# Patient Record
Sex: Male | Born: 1940 | Race: Black or African American | Hispanic: No | Smoking: Former smoker
Health system: Southern US, Community
[De-identification: ages and names within clinical notes are randomized; demographics above are authoritative.]

## PROBLEM LIST (undated history)

## (undated) DIAGNOSIS — E782 Mixed hyperlipidemia: Secondary | ICD-10-CM

## (undated) DIAGNOSIS — K635 Polyp of colon: Secondary | ICD-10-CM

## (undated) DIAGNOSIS — I10 Essential (primary) hypertension: Secondary | ICD-10-CM

## (undated) DIAGNOSIS — I350 Nonrheumatic aortic (valve) stenosis: Secondary | ICD-10-CM

## (undated) DIAGNOSIS — I4510 Unspecified right bundle-branch block: Secondary | ICD-10-CM

## (undated) DIAGNOSIS — M1990 Unspecified osteoarthritis, unspecified site: Secondary | ICD-10-CM

## (undated) DIAGNOSIS — K219 Gastro-esophageal reflux disease without esophagitis: Secondary | ICD-10-CM

## (undated) DIAGNOSIS — H548 Legal blindness, as defined in USA: Secondary | ICD-10-CM

## (undated) DIAGNOSIS — E119 Type 2 diabetes mellitus without complications: Secondary | ICD-10-CM

## (undated) DIAGNOSIS — H409 Unspecified glaucoma: Secondary | ICD-10-CM

## (undated) DIAGNOSIS — I739 Peripheral vascular disease, unspecified: Secondary | ICD-10-CM

## (undated) DIAGNOSIS — C801 Malignant (primary) neoplasm, unspecified: Secondary | ICD-10-CM

## (undated) DIAGNOSIS — D649 Anemia, unspecified: Secondary | ICD-10-CM

## (undated) DIAGNOSIS — N186 End stage renal disease: Secondary | ICD-10-CM

## (undated) DIAGNOSIS — N189 Chronic kidney disease, unspecified: Secondary | ICD-10-CM

## (undated) HISTORY — PX: KNEE ARTHROSCOPY: SUR90

## (undated) HISTORY — PX: COLONOSCOPY: SHX174

## (undated) HISTORY — DX: Chronic kidney disease, unspecified: N18.9

## (undated) HISTORY — PX: PROSTATE SURGERY: SHX751

---

## 1998-09-11 ENCOUNTER — Emergency Department (HOSPITAL_COMMUNITY): Admission: EM | Admit: 1998-09-11 | Discharge: 1998-09-11 | Payer: Self-pay | Admitting: Emergency Medicine

## 2001-08-06 ENCOUNTER — Encounter (INDEPENDENT_AMBULATORY_CARE_PROVIDER_SITE_OTHER): Payer: Self-pay | Admitting: Specialist

## 2001-08-06 ENCOUNTER — Ambulatory Visit (HOSPITAL_COMMUNITY): Admission: RE | Admit: 2001-08-06 | Discharge: 2001-08-06 | Payer: Self-pay | Admitting: Gastroenterology

## 2001-11-17 ENCOUNTER — Encounter: Payer: Self-pay | Admitting: Emergency Medicine

## 2001-11-17 ENCOUNTER — Emergency Department (HOSPITAL_COMMUNITY): Admission: EM | Admit: 2001-11-17 | Discharge: 2001-11-17 | Payer: Self-pay | Admitting: Emergency Medicine

## 2001-11-23 ENCOUNTER — Emergency Department (HOSPITAL_COMMUNITY): Admission: EM | Admit: 2001-11-23 | Discharge: 2001-11-24 | Payer: Self-pay | Admitting: *Deleted

## 2001-11-24 ENCOUNTER — Encounter: Payer: Self-pay | Admitting: Emergency Medicine

## 2001-12-04 ENCOUNTER — Encounter: Admission: RE | Admit: 2001-12-04 | Discharge: 2001-12-04 | Payer: Self-pay | Admitting: Gastroenterology

## 2001-12-04 ENCOUNTER — Encounter: Payer: Self-pay | Admitting: Gastroenterology

## 2002-06-02 ENCOUNTER — Ambulatory Visit (HOSPITAL_COMMUNITY): Admission: RE | Admit: 2002-06-02 | Discharge: 2002-06-02 | Payer: Self-pay | Admitting: Orthopedic Surgery

## 2002-06-03 ENCOUNTER — Ambulatory Visit (HOSPITAL_COMMUNITY): Admission: RE | Admit: 2002-06-03 | Discharge: 2002-06-03 | Payer: Self-pay | Admitting: Orthopedic Surgery

## 2003-11-19 ENCOUNTER — Emergency Department (HOSPITAL_COMMUNITY): Admission: EM | Admit: 2003-11-19 | Discharge: 2003-11-19 | Payer: Self-pay | Admitting: Emergency Medicine

## 2004-01-04 ENCOUNTER — Ambulatory Visit: Payer: Self-pay | Admitting: Internal Medicine

## 2004-04-11 ENCOUNTER — Ambulatory Visit: Payer: Self-pay | Admitting: Internal Medicine

## 2004-07-11 ENCOUNTER — Ambulatory Visit: Payer: Self-pay | Admitting: Internal Medicine

## 2004-09-12 ENCOUNTER — Ambulatory Visit: Payer: Self-pay | Admitting: Internal Medicine

## 2004-12-26 ENCOUNTER — Ambulatory Visit: Payer: Self-pay | Admitting: Internal Medicine

## 2005-02-14 ENCOUNTER — Emergency Department (HOSPITAL_COMMUNITY): Admission: EM | Admit: 2005-02-14 | Discharge: 2005-02-14 | Payer: Self-pay | Admitting: Emergency Medicine

## 2005-03-13 ENCOUNTER — Ambulatory Visit: Admission: RE | Admit: 2005-03-13 | Discharge: 2005-06-22 | Payer: Self-pay | Admitting: Radiation Oncology

## 2005-03-27 ENCOUNTER — Ambulatory Visit: Payer: Self-pay | Admitting: Internal Medicine

## 2005-05-04 ENCOUNTER — Encounter: Admission: RE | Admit: 2005-05-04 | Discharge: 2005-05-04 | Payer: Self-pay | Admitting: Urology

## 2005-05-18 ENCOUNTER — Ambulatory Visit (HOSPITAL_BASED_OUTPATIENT_CLINIC_OR_DEPARTMENT_OTHER): Admission: RE | Admit: 2005-05-18 | Discharge: 2005-05-18 | Payer: Self-pay | Admitting: Urology

## 2006-03-18 ENCOUNTER — Emergency Department (HOSPITAL_COMMUNITY): Admission: EM | Admit: 2006-03-18 | Discharge: 2006-03-18 | Payer: Self-pay | Admitting: Emergency Medicine

## 2006-10-22 DIAGNOSIS — N400 Enlarged prostate without lower urinary tract symptoms: Secondary | ICD-10-CM | POA: Insufficient documentation

## 2006-10-22 DIAGNOSIS — Z860100 Personal history of colon polyps, unspecified: Secondary | ICD-10-CM | POA: Insufficient documentation

## 2006-10-22 DIAGNOSIS — E1129 Type 2 diabetes mellitus with other diabetic kidney complication: Secondary | ICD-10-CM | POA: Insufficient documentation

## 2007-06-05 ENCOUNTER — Emergency Department (HOSPITAL_COMMUNITY): Admission: EM | Admit: 2007-06-05 | Discharge: 2007-06-05 | Payer: Self-pay | Admitting: Emergency Medicine

## 2008-02-28 ENCOUNTER — Emergency Department (HOSPITAL_COMMUNITY): Admission: EM | Admit: 2008-02-28 | Discharge: 2008-02-29 | Payer: Self-pay | Admitting: Emergency Medicine

## 2008-03-03 ENCOUNTER — Emergency Department (HOSPITAL_COMMUNITY): Admission: EM | Admit: 2008-03-03 | Discharge: 2008-03-03 | Payer: Self-pay | Admitting: *Deleted

## 2010-07-21 NOTE — Op Note (Signed)
NAME:  Elijah White, Elijah White               ACCOUNT NO.:  [id number]   MEDICAL RECORD NO.:  [id number]          PATIENT TYPE:  AMB   LOCATION:  [facility]                         FACILITY:  [facility]   PHYSICIAN:  Mark C. Karsten Ro, M.D.  DATE OF BIRTH:  10/24/1940   DATE OF PROCEDURE:  05/18/2005  DATE OF DISCHARGE:  05/18/2005                                 OPERATIVE REPORT   PREOPERATIVE DIAGNOSIS:  Adenocarcinoma of the prostate.   POSTOPERATIVE DIAGNOSIS:  Adenocarcinoma of the prostate.   PROCEDURE:  I-125 seed implant.   SURGEON:  Mark C. Karsten Ro, M.D.   RADIATION ONCOLOGIST:  Sheral Apley. Tammi Klippel, M.D.   ANESTHESIA:  General.   DRAINS:  16-French Foley catheter.   BLOOD LOSS:  Minimal.   COMPLICATIONS:  None.   INDICATIONS:  The patient is a 70 year old black male with biopsy-proven  adenocarcinoma of the prostate. He has elected to proceed with radioactive  seed implant. 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 He  is brought to the OR today for radioactive seed implant. He has undergone  external radiotherapy for 5 weeks and now presents for radioactive seed  implant. The risks, complications and alternatives have been discussed, he  understands and elected to proceed.   DESCRIPTION OF OPERATION:  After informed consent, the patient was brought  to the major OR, placed on the table, administered general LMA anesthesia.  Genitalia and perineum were sterilely prepped and draped. A rectal tube was  placed in the rectum, a 18 French Foley catheter was placed in the bladder  with dilute contrast filling the balloon. The transrectal ultrasound probe  was then placed in the rectum and real-time ultrasound planning was  performed. This was done by Dr. Tammi Klippel and once this was completed I  proceeded according to the calculated plan using real-time fluoroscopy to  place the seeds which was  performed without difficulty. After all seeds were  placed, the transrectal ultrasound, rectal tube and Foley catheter were  removed.   A 17-French flexible scope was then used and cystoscopy was performed. The  urethra was noted to be normal, sphincter intact. The prostatic urethra  revealed no lesions. I noted a single seed within the bladder. It appeared  over on the left-hand side and was grasped with forceps and extracted. The  remainder of the bladder was inspected and noted to be free of any tumor,  stones or inflammatory lesions.   A new 16-French Foley catheter was placed in the bladder and connected to  close system drainage. The patient was awakened and taken to the recovery  room in stable satisfactory condition. He tolerated the procedure well with  no intraoperative complications.   He will be maintained on Cipro 500 mg b.i.d. for a week, given a  prescription for Vicodin ES #30 and will take Flomax 1 q.h.s. nightly as  well. He will follow-up with myself and Dr. Tammi Klippel in 3 weeks.      Mark C. Karsten Ro, M.D.  Electronically Signed  MCO/MEDQ  D:  05/18/2005  T:  05/21/2005  Job:  [id number]   cc:   Sheral Apley Tammi Klippel, M.D.  Fax: [phone]

## 2010-07-21 NOTE — Procedures (Signed)
[facility]  Patient:    Elijah White, Elijah White Visit Number: [id number] MRN: [id number]          Service Type: END Location: ENDO Attending Physician:  Rafael Bihari Dictated by:   Elyse Jarvis Amedeo Plenty, M.D. Proc. Date: 08/06/01 Admit Date:  08/06/2001 Discharge Date: 08/06/2001   CC:         Marletta Lor, M.D.   Procedure Report  PROCEDURE:  Colonoscopy with polypectomy.  INDICATION FOR PROCEDURE:  History of adenomatous colon polyps with last colonoscopy 10 years ago.  DESCRIPTION OF PROCEDURE:  The patient was placed in the left lateral decubitus position and placed on the pulse monitor with continuous low flow oxygen delivered by nasal cannula. He was sedated with 50 mg IV Demerol and 6 mg IV Versed. The Olympus video colonoscope was inserted into the rectum and advanced to the cecum, confirmed by transillumination at McBurneys point and visualization of the ileocecal valve and appendiceal orifice. The prep was excellent. Within the base of the cecum near the appendiceal orifice was a 1 cm sessile polyp which was removed by snare. The remainder of the cecum, ascending, transverse, descending and sigmoid colon all appeared normal with no masses, polyps, diverticula or other mucosal abnormalities. The scope was withdrawn and the patient returned to the recovery room in stable condition. The patient tolerated the procedure well and there were no immediate complications.  IMPRESSION:  Cecal polyp otherwise normal colonoscopy.  PLAN:  Await histology and will probably recommend repeat colonoscopy in five years. Dictated by:   Elyse Jarvis Amedeo Plenty, M.D. Attending Physician:  Rafael Bihari DD:  08/06/01 TD:  08/08/01 Job: [id number]

## 2010-07-21 NOTE — Op Note (Signed)
NAME:  Elijah White, Elijah White                         ACCOUNT NO.:  [id number]   MEDICAL RECORD NO.:  [id number]                   PATIENT TYPE:  AMB   LOCATION:  DAY                                  [facility]ACILITY:  [facility]   PHYSICIAN:  Kipp Brood. Gladstone Lighter, M.D.             DATE OF BIRTH:  05/20/1940   DATE OF PROCEDURE:  06/03/2002  DATE OF DISCHARGE:                                 OPERATIVE REPORT   SURGEON:  Kipp Brood. Gladstone Lighter, M.D.   ASSISTANT:  Nurse.   PREOPERATIVE DIAGNOSES:  1. Degenerative arthritis of the left knee.  2. Torn medial meniscus of the left knee.   POSTOPERATIVE DIAGNOSES:  1. Degenerative arthritis of the left knee.  2. Torn medial meniscus of the left knee.   OPERATION:  1. Diagnostic arthroscopy of left knee.  2. Medial meniscectomy of the posterior horn, left knee.  3. Synovectomy, left knee with suprapatellar pouch.   DESCRIPTION OF PROCEDURE:  Under spinal anesthesia, a routine orthopedic  prep and draping of the left lower extremity was carried out.  IV Ancef 1 g  preop.  At this time, a small incision was made in the suprapatellar pouch.  Inflow canula was inserted.  The knee was distended with saline.  Another  small punctate incision made anterolateral joint.  Arthroscope was entered,  and a complete diagnostic arthroscopy was carried out.  Following another  small incision was made medially.  I entered the shaver suction device, did  a partial medial meniscectomy to the posterior horn.  Photographs were  taken.  At this time, I went up in the suprapatellar pouch and did a  synovectomy.  He had some arthritic changes of the patella.  The lateral  joint was fine.  Cruciates were intact.  Going back down the medial joint  space and noted that he had rather significant arthritic changes in his  medial femoral condyle as well as his medial tibial plateau.  His main  problem was medial joint arthritis and a tear of the posterior and the  medial meniscus.   Thoroughly irrigated and cleaned the area out, closed all  three punctate incisions with 3-0 nylon suture.  I injected 30 mL of 0.5%  Marcaine with epinephrine into the knee joint.  Sterile Neosporin bundle  dressing was applied, and he left the operating room in satisfactory  condition.   FOLLOW-UP CARE:  1. He will be on crutches.  2. Partial weightbearing.  3. He will be on Percocet 10/650 for pain.  4. He will be on Bufferin 1 b.i.d. as an anticoagulant x 2 weeks.  5. I will see him in the office in 12-14 days for a follow-up.  Ronald A. Gladstone Lighter, M.D.   RAG/MEDQ  D:  06/03/2002  T:  06/03/2002  Job:  [id number]

## 2010-11-28 LAB — URINALYSIS, ROUTINE W REFLEX MICROSCOPIC
Bilirubin Urine: NEGATIVE
Ketones, ur: NEGATIVE
Nitrite: NEGATIVE
Urobilinogen, UA: 0.2

## 2010-11-28 LAB — URINE CULTURE: Culture: NO GROWTH

## 2010-12-07 LAB — COMPREHENSIVE METABOLIC PANEL
ALT: 16 U/L (ref 0–53)
BUN: 22 mg/dL (ref 6–23)
CO2: 23 mEq/L (ref 19–32)
CO2: 27 mEq/L (ref 19–32)
Calcium: 9.1 mg/dL (ref 8.4–10.5)
Calcium: 9.2 mg/dL (ref 8.4–10.5)
Chloride: 108 mEq/L (ref 96–112)
Creatinine, Ser: 1.63 mg/dL — ABNORMAL HIGH (ref 0.4–1.5)
Creatinine, Ser: 1.88 mg/dL — ABNORMAL HIGH (ref 0.4–1.5)
GFR calc non Af Amer: 36 mL/min — ABNORMAL LOW (ref >60)
GFR calc non Af Amer: 42 mL/min — ABNORMAL LOW (ref >60)
Glucose, Bld: 112 mg/dL — ABNORMAL HIGH (ref 70–99)
Glucose, Bld: 169 mg/dL — ABNORMAL HIGH (ref 70–99)
Sodium: 140 mEq/L (ref 135–145)
Total Bilirubin: 1.2 mg/dL (ref 0.3–1.2)

## 2010-12-07 LAB — DIFFERENTIAL
Basophils Absolute: 0 10*3/uL (ref 0.0–0.1)
Basophils Absolute: 0 10*3/uL (ref 0.0–0.1)
Eosinophils Absolute: 0.1 10*3/uL (ref 0.0–0.7)
Lymphocytes Relative: 25 % (ref 12–46)
Lymphs Abs: 1.4 10*3/uL (ref 0.7–4.0)
Lymphs Abs: 1.5 10*3/uL (ref 0.7–4.0)
Neutrophils Relative %: 58 % (ref 43–77)
Neutrophils Relative %: 62 % (ref 43–77)

## 2010-12-07 LAB — CBC
HCT: 41.6 % (ref 39.0–52.0)
Hemoglobin: 13.3 g/dL (ref 13.0–17.0)
Hemoglobin: 13.9 g/dL (ref 13.0–17.0)
MCHC: 32.6 g/dL (ref 30.0–36.0)
MCHC: 33.5 g/dL (ref 30.0–36.0)
MCV: 95.3 fL (ref 78.0–100.0)
MCV: 97.1 fL (ref 78.0–100.0)
RBC: 4.22 MIL/uL (ref 4.22–5.81)
RBC: 4.37 MIL/uL (ref 4.22–5.81)

## 2010-12-07 LAB — URINALYSIS, ROUTINE W REFLEX MICROSCOPIC
Ketones, ur: NEGATIVE mg/dL
Ketones, ur: NEGATIVE mg/dL
Nitrite: NEGATIVE
Nitrite: NEGATIVE
Protein, ur: NEGATIVE mg/dL
Specific Gravity, Urine: 1.012 (ref 1.005–1.030)
Urobilinogen, UA: 0.2 mg/dL (ref 0.0–1.0)
Urobilinogen, UA: 0.2 mg/dL (ref 0.0–1.0)
pH: 5.5 (ref 5.0–8.0)
pH: 6 (ref 5.0–8.0)

## 2010-12-07 LAB — LIPASE, BLOOD
Lipase: 23 U/L (ref 11–59)
Lipase: 28 U/L (ref 11–59)

## 2010-12-07 LAB — RAPID URINE DRUG SCREEN, HOSP PERFORMED
Cocaine: NOT DETECTED
Tetrahydrocannabinol: NOT DETECTED

## 2010-12-07 LAB — POCT CARDIAC MARKERS
CKMB, poc: 2.8 ng/mL (ref 1.0–8.0)
CKMB, poc: 3.5 ng/mL (ref 1.0–8.0)
Troponin i, poc: <0.05 ng/mL (ref 0.00–0.09)

## 2011-06-18 LAB — HM DIABETES EYE EXAM

## 2012-08-22 ENCOUNTER — Encounter (HOSPITAL_COMMUNITY): Payer: Self-pay | Admitting: Emergency Medicine

## 2012-08-22 ENCOUNTER — Emergency Department (HOSPITAL_COMMUNITY)
Admission: EM | Admit: 2012-08-22 | Discharge: 2012-08-22 | Disposition: A | Discharge status: Home / Self Care | Payer: Medicare Other | Attending: Emergency Medicine | Admitting: Emergency Medicine

## 2012-08-22 ENCOUNTER — Emergency Department (HOSPITAL_COMMUNITY): Payer: Medicare Other

## 2012-08-22 DIAGNOSIS — Z862 Personal history of diseases of the blood and blood-forming organs and certain disorders involving the immune mechanism: Secondary | ICD-10-CM | POA: Insufficient documentation

## 2012-08-22 DIAGNOSIS — Z8639 Personal history of other endocrine, nutritional and metabolic disease: Secondary | ICD-10-CM | POA: Insufficient documentation

## 2012-08-22 DIAGNOSIS — R609 Edema, unspecified: Secondary | ICD-10-CM | POA: Insufficient documentation

## 2012-08-22 DIAGNOSIS — F17200 Nicotine dependence, unspecified, uncomplicated: Secondary | ICD-10-CM | POA: Insufficient documentation

## 2012-08-22 DIAGNOSIS — E119 Type 2 diabetes mellitus without complications: Secondary | ICD-10-CM | POA: Insufficient documentation

## 2012-08-22 DIAGNOSIS — I10 Essential (primary) hypertension: Secondary | ICD-10-CM | POA: Insufficient documentation

## 2012-08-22 DIAGNOSIS — I169 Hypertensive crisis, unspecified: Secondary | ICD-10-CM

## 2012-08-22 HISTORY — DX: Mixed hyperlipidemia: E78.2

## 2012-08-22 HISTORY — DX: Essential (primary) hypertension: I10

## 2012-08-22 HISTORY — DX: Type 2 diabetes mellitus without complications: E11.9

## 2012-08-22 LAB — URINALYSIS, ROUTINE W REFLEX MICROSCOPIC
Bilirubin Urine: NEGATIVE
Ketones, ur: NEGATIVE mg/dL
Leukocytes, UA: NEGATIVE
Nitrite: NEGATIVE
Protein, ur: >300 mg/dL — AB
Urobilinogen, UA: 1 mg/dL (ref 0.0–1.0)
pH: 6.5 (ref 5.0–8.0)

## 2012-08-22 LAB — BASIC METABOLIC PANEL
BUN: 15 mg/dL (ref 6–23)
CO2: 26 mEq/L (ref 19–32)
Chloride: 103 mEq/L (ref 96–112)
GFR calc Af Amer: 50 mL/min — ABNORMAL LOW (ref >90)
Glucose, Bld: 241 mg/dL — ABNORMAL HIGH (ref 70–99)
Potassium: 3.7 mEq/L (ref 3.5–5.1)

## 2012-08-22 LAB — CBC WITH DIFFERENTIAL/PLATELET
Hemoglobin: 15.8 g/dL (ref 13.0–17.0)
Lymphocytes Relative: 28 % (ref 12–46)
Lymphs Abs: 1.5 10*3/uL (ref 0.7–4.0)
Monocytes Relative: 10 % (ref 3–12)
Neutro Abs: 3.2 10*3/uL (ref 1.7–7.7)
Neutrophils Relative %: 60 % (ref 43–77)
RBC: 4.88 MIL/uL (ref 4.22–5.81)

## 2012-08-22 LAB — URINE MICROSCOPIC-ADD ON

## 2012-08-22 MED ORDER — ESOMEPRAZOLE MAGNESIUM 40 MG PO CPDR: 40.0000 mg | DELAYED_RELEASE_CAPSULE | Freq: Every day | ORAL | Status: DC

## 2012-08-22 MED ORDER — OLMESARTAN MEDOXOMIL-HCTZ 20-12.5 MG PO TABS: 1.0000 | ORAL_TABLET | Freq: Every day | ORAL | Status: DC

## 2012-08-22 MED ORDER — ATORVASTATIN CALCIUM 10 MG PO TABS: 20.0000 mg | ORAL_TABLET | Freq: Every day | ORAL | Status: DC

## 2012-08-22 MED ORDER — METFORMIN HCL 1000 MG PO TABS: 1000.0000 mg | ORAL_TABLET | Freq: Two times a day (BID) | ORAL | Status: DC

## 2012-08-22 NOTE — ED Provider Notes (Signed)
History     CSN: [id number]  Arrival date & time 08/22/12  1034   First MD Initiated Contact with Patient 08/22/12 1105      Chief Complaint  Patient presents with  . Hypertension    (Consider location/radiation/quality/duration/timing/severity/associated sxs/prior treatment) HPI Patient presents with concern of hypertension.  He has no physical complaints, states that he has been in his usual state of health, with no recent changes.  A home health visit today found the patient to be hypertensive, with blood pressure 208/102. Ureterectasis has no ongoing physical complaints. He does have a history of hypertension, and he acknowledges that he has not taken any medication in months.  Past Medical History  Diagnosis Date  . Diabetes mellitus without complication   . Hypertension   . Elevated cholesterol with high triglycerides     No past surgical history on file.  No family history on file.  History  Substance Use Topics  . Smoking status: Current Every Day Smoker  . Smokeless tobacco: Not on file  . Alcohol Use: No      Review of Systems  Constitutional:       Per HPI, otherwise negative  HENT:       Per HPI, otherwise negative  Respiratory:       Per HPI, otherwise negative  Cardiovascular:       Per HPI, otherwise negative  Gastrointestinal: Negative for vomiting.  Endocrine:       Negative aside from HPI  Genitourinary:       Neg aside from HPI   Musculoskeletal:       Mild symmetric bilateral lower extremity edema  Skin: Negative.   Neurological: Negative for syncope.    Allergies  Review of patient's allergies indicates no known allergies.  Home Medications  No current outpatient prescriptions on file.  BP 177/89  Pulse 66  Temp(Src) 98 F (36.7 C) (Oral)  Resp 20  SpO2 100%  Physical Exam  Nursing note and vitals reviewed. Constitutional: He is oriented to person, place, and time. He appears well-developed. No distress.  HENT:  Head:  Normocephalic and atraumatic.  Eyes: Conjunctivae and EOM are normal.  Cardiovascular: Normal rate and regular rhythm.   Pulmonary/Chest: Effort normal. No stridor. No respiratory distress.  Abdominal: He exhibits no distension.  Musculoskeletal: He exhibits edema.  Symmetric one plus edema bilaterally, no tenderness to palpation  Neurological: He is alert and oriented to person, place, and time. No cranial nerve deficit. He exhibits normal muscle tone. Coordination normal.  Skin: Skin is warm and dry.  Psychiatric: He has a normal mood and affect.    ED Course  Procedures (including critical care time)  Labs Reviewed  URINALYSIS, ROUTINE W REFLEX MICROSCOPIC  CBC WITH DIFFERENTIAL  BASIC METABOLIC PANEL   Dg Chest 2 View  08/22/2012   *RADIOLOGY REPORT*  Clinical Data: Chest discomfort  CHEST - 2 VIEW  Comparison: 02/29/2008  Findings:  Grossly unchanged cardiac silhouette and mediastinal contours. Evaluation of the retrosternal clear space is obscured secondary to overlying soft tissues.  Unchanged minimal right infrahilar heterogeneous opacities favored to represent atelectasis.  No new focal airspace opacity.  No pleural effusion or pneumothorax.  No definite evidence of edema.  Unchanged bones.  IMPRESSION: No acute cardiopulmonary disease.   Original Report Authenticated By: Jake Seats, MD     No diagnosis found.  Pulse ox 99% room air normal  Update: Throughout the patient's emergency department stay he remained in no distress, with  no new complaints.  He was updated all lab results.  Discharged in stable condition.   Date: 08/22/2012  Rate: 47  Rhythm: sinus bradycardia  QRS Axis: normal  Intervals: normal  ST/T Wave abnormalities: normal  Conduction Disutrbances:none  Narrative Interpretation:   Old EKG Reviewed: none available BORDERLINE  Cardiac: 61- sr, normal     MDM  This patient presents with essentially asymptomatic hypertension.  On exam he is  awake and alert, no neurologic complaints.  Patient labs were reassuring, though there is demonstration of decreased renal function.  Absent any significant complaints, the patient was discharged in stable condition with reinitiation of his antihypertensives to f/u w PMD.        Carmin Muskrat, MD 08/22/12 1431

## 2012-08-22 NOTE — ED Notes (Signed)
Patient was by a nurse from [employer] and found his BP to be 202/118. Patient states the she told him to come to the ED.  Patient states that he has BP medications but he is not taking them.

## 2012-08-22 NOTE — ED Notes (Signed)
Wife stated, the nurse did a home visit for [facility] and his BP was up. Told top come to ER

## 2012-10-29 DIAGNOSIS — IMO0002 Reserved for concepts with insufficient information to code with codable children: Secondary | ICD-10-CM | POA: Insufficient documentation

## 2012-10-29 DIAGNOSIS — H401190 Primary open-angle glaucoma, unspecified eye, stage unspecified: Secondary | ICD-10-CM | POA: Insufficient documentation

## 2013-09-07 ENCOUNTER — Other Ambulatory Visit: Payer: Self-pay | Admitting: Internal Medicine

## 2013-09-07 DIAGNOSIS — Z136 Encounter for screening for cardiovascular disorders: Secondary | ICD-10-CM

## 2014-12-10 DIAGNOSIS — N139 Obstructive and reflux uropathy, unspecified: Secondary | ICD-10-CM | POA: Diagnosis not present

## 2014-12-10 DIAGNOSIS — R32 Unspecified urinary incontinence: Secondary | ICD-10-CM | POA: Diagnosis not present

## 2014-12-10 DIAGNOSIS — N183 Chronic kidney disease, stage 3 (moderate): Secondary | ICD-10-CM | POA: Diagnosis not present

## 2014-12-10 DIAGNOSIS — I10 Essential (primary) hypertension: Secondary | ICD-10-CM | POA: Diagnosis not present

## 2014-12-22 DIAGNOSIS — E1122 Type 2 diabetes mellitus with diabetic chronic kidney disease: Secondary | ICD-10-CM | POA: Diagnosis not present

## 2014-12-22 DIAGNOSIS — N183 Chronic kidney disease, stage 3 (moderate): Secondary | ICD-10-CM | POA: Diagnosis not present

## 2014-12-22 DIAGNOSIS — N08 Glomerular disorders in diseases classified elsewhere: Secondary | ICD-10-CM | POA: Diagnosis not present

## 2014-12-22 DIAGNOSIS — I129 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 kidney disease, or unspecified chronic kidney disease: Secondary | ICD-10-CM | POA: Diagnosis not present

## 2014-12-22 DIAGNOSIS — R609 Edema, unspecified: Secondary | ICD-10-CM | POA: Diagnosis not present

## 2015-02-07 DIAGNOSIS — H401133 Primary open-angle glaucoma, bilateral, severe stage: Secondary | ICD-10-CM | POA: Diagnosis not present

## 2015-04-06 DIAGNOSIS — I129 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 kidney disease, or unspecified chronic kidney disease: Secondary | ICD-10-CM | POA: Diagnosis not present

## 2015-04-06 DIAGNOSIS — D179 Benign lipomatous neoplasm, unspecified: Secondary | ICD-10-CM | POA: Diagnosis not present

## 2015-04-06 DIAGNOSIS — Z79899 Other long term (current) drug therapy: Secondary | ICD-10-CM | POA: Diagnosis not present

## 2015-04-06 DIAGNOSIS — E1122 Type 2 diabetes mellitus with diabetic chronic kidney disease: Secondary | ICD-10-CM | POA: Diagnosis not present

## 2015-04-06 DIAGNOSIS — N183 Chronic kidney disease, stage 3 (moderate): Secondary | ICD-10-CM | POA: Diagnosis not present

## 2015-04-20 DIAGNOSIS — K1321 Leukoplakia of oral mucosa, including tongue: Secondary | ICD-10-CM | POA: Diagnosis not present

## 2015-04-22 DIAGNOSIS — H401133 Primary open-angle glaucoma, bilateral, severe stage: Secondary | ICD-10-CM | POA: Diagnosis not present

## 2015-05-27 ENCOUNTER — Ambulatory Visit (INDEPENDENT_AMBULATORY_CARE_PROVIDER_SITE_OTHER): Payer: Medicare Other | Admitting: Podiatry

## 2015-05-27 ENCOUNTER — Encounter: Payer: Self-pay | Admitting: Podiatry

## 2015-05-27 VITALS — BP 163/82 | HR 59 | Resp 14

## 2015-05-27 DIAGNOSIS — E119 Type 2 diabetes mellitus without complications: Secondary | ICD-10-CM

## 2015-05-27 DIAGNOSIS — M79609 Pain in unspecified limb: Principal | ICD-10-CM

## 2015-05-27 DIAGNOSIS — B351 Tinea unguium: Secondary | ICD-10-CM | POA: Diagnosis not present

## 2015-05-27 NOTE — Progress Notes (Signed)
   Subjective:    Patient ID: Elijah White, male    DOB: Jul 11, 1940, 75 y.o.   MRN: [id number]  HPI this patient presents to the office for an evaluation of his diabetic feet as well as treatment of his long nails. He says his nails have grown long and thick and have become ingrown his nails are painful as he walks and wears his shoes. Patient denies any injury drainage or pain from his nails. He presents the office today for preventative foot care services and evaluation of his diabetic feet    Review of Systems  All other systems reviewed and are negative.      Objective:   Physical Exam GENERAL APPEARANCE: Alert, conversant. Appropriately groomed. No acute distress.  VASCULAR: Pedal pulses are  palpable at  DP  Bilateral. PT pulses are non palpable both feet.  Capillary refill time is immediate to all digits,  Normal temperature gradient.   NEUROLOGIC: sensation is normal to 5.07 monofilament at 5/5 sites bilateral.  Light touch is intact bilateral, Muscle strength normal.  MUSCULOSKELETAL: acceptable muscle strength, tone and stability bilateral.  Intrinsic muscluature intact bilateral.  Rectus appearance of foot and digits noted bilateral. Mild swelling noted.  DERMATOLOGIC: skin color, texture, and turgor are within normal limits.  No preulcerative lesions or ulcers  are seen, no interdigital maceration noted.  No open lesions present.  . No drainage noted.  NAILS  Thick disfigured discolored nails both feet.  No nail infections noted.        Assessment & Plan:  Onychomycosis B/L   Diabetes with angiopathy   IE  Debride nails bilateral  RTC 3 months  Gardiner Barefoot DPM

## 2015-06-13 DIAGNOSIS — H401133 Primary open-angle glaucoma, bilateral, severe stage: Secondary | ICD-10-CM | POA: Diagnosis not present

## 2015-07-08 DIAGNOSIS — N183 Chronic kidney disease, stage 3 (moderate): Secondary | ICD-10-CM | POA: Diagnosis not present

## 2015-07-08 DIAGNOSIS — I129 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 kidney disease, or unspecified chronic kidney disease: Secondary | ICD-10-CM | POA: Diagnosis not present

## 2015-07-08 DIAGNOSIS — E1122 Type 2 diabetes mellitus with diabetic chronic kidney disease: Secondary | ICD-10-CM | POA: Diagnosis not present

## 2015-07-21 DIAGNOSIS — R600 Localized edema: Secondary | ICD-10-CM | POA: Diagnosis not present

## 2015-07-21 DIAGNOSIS — N183 Chronic kidney disease, stage 3 (moderate): Secondary | ICD-10-CM | POA: Diagnosis not present

## 2015-07-21 DIAGNOSIS — I10 Essential (primary) hypertension: Secondary | ICD-10-CM | POA: Diagnosis not present

## 2015-07-21 DIAGNOSIS — N139 Obstructive and reflux uropathy, unspecified: Secondary | ICD-10-CM | POA: Diagnosis not present

## 2015-10-14 DIAGNOSIS — N183 Chronic kidney disease, stage 3 (moderate): Secondary | ICD-10-CM | POA: Diagnosis not present

## 2015-10-14 DIAGNOSIS — Z79899 Other long term (current) drug therapy: Secondary | ICD-10-CM | POA: Diagnosis not present

## 2015-10-14 DIAGNOSIS — N08 Glomerular disorders in diseases classified elsewhere: Secondary | ICD-10-CM | POA: Diagnosis not present

## 2015-10-14 DIAGNOSIS — E1122 Type 2 diabetes mellitus with diabetic chronic kidney disease: Secondary | ICD-10-CM | POA: Diagnosis not present

## 2015-10-14 DIAGNOSIS — I129 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 kidney disease, or unspecified chronic kidney disease: Secondary | ICD-10-CM | POA: Diagnosis not present

## 2015-10-18 DIAGNOSIS — I10 Essential (primary) hypertension: Secondary | ICD-10-CM | POA: Diagnosis not present

## 2015-10-18 DIAGNOSIS — N139 Obstructive and reflux uropathy, unspecified: Secondary | ICD-10-CM | POA: Diagnosis not present

## 2015-10-18 DIAGNOSIS — C61 Malignant neoplasm of prostate: Secondary | ICD-10-CM | POA: Diagnosis not present

## 2015-10-18 DIAGNOSIS — N183 Chronic kidney disease, stage 3 (moderate): Secondary | ICD-10-CM | POA: Diagnosis not present

## 2015-10-26 DIAGNOSIS — H401133 Primary open-angle glaucoma, bilateral, severe stage: Secondary | ICD-10-CM | POA: Diagnosis not present

## 2015-11-21 DIAGNOSIS — H401133 Primary open-angle glaucoma, bilateral, severe stage: Secondary | ICD-10-CM | POA: Diagnosis not present

## 2016-01-11 DIAGNOSIS — N139 Obstructive and reflux uropathy, unspecified: Secondary | ICD-10-CM | POA: Diagnosis not present

## 2016-01-11 DIAGNOSIS — N183 Chronic kidney disease, stage 3 (moderate): Secondary | ICD-10-CM | POA: Diagnosis not present

## 2016-01-11 DIAGNOSIS — C61 Malignant neoplasm of prostate: Secondary | ICD-10-CM | POA: Diagnosis not present

## 2016-01-11 DIAGNOSIS — I10 Essential (primary) hypertension: Secondary | ICD-10-CM | POA: Diagnosis not present

## 2016-04-01 DIAGNOSIS — R05 Cough: Secondary | ICD-10-CM | POA: Diagnosis not present

## 2016-04-01 DIAGNOSIS — R509 Fever, unspecified: Secondary | ICD-10-CM | POA: Diagnosis not present

## 2016-04-01 DIAGNOSIS — J069 Acute upper respiratory infection, unspecified: Secondary | ICD-10-CM | POA: Diagnosis not present

## 2016-05-01 DIAGNOSIS — H401133 Primary open-angle glaucoma, bilateral, severe stage: Secondary | ICD-10-CM | POA: Diagnosis not present

## 2016-06-04 DIAGNOSIS — H401133 Primary open-angle glaucoma, bilateral, severe stage: Secondary | ICD-10-CM | POA: Diagnosis not present

## 2016-06-11 DIAGNOSIS — H401133 Primary open-angle glaucoma, bilateral, severe stage: Secondary | ICD-10-CM | POA: Diagnosis not present

## 2016-06-11 DIAGNOSIS — Z9841 Cataract extraction status, right eye: Secondary | ICD-10-CM | POA: Diagnosis not present

## 2016-06-11 DIAGNOSIS — Z79899 Other long term (current) drug therapy: Secondary | ICD-10-CM | POA: Diagnosis not present

## 2016-06-11 DIAGNOSIS — H02833 Dermatochalasis of right eye, unspecified eyelid: Secondary | ICD-10-CM | POA: Diagnosis not present

## 2016-06-11 DIAGNOSIS — Z9889 Other specified postprocedural states: Secondary | ICD-10-CM | POA: Diagnosis not present

## 2016-06-11 DIAGNOSIS — H2512 Age-related nuclear cataract, left eye: Secondary | ICD-10-CM | POA: Diagnosis not present

## 2016-06-11 DIAGNOSIS — H5703 Miosis: Secondary | ICD-10-CM | POA: Diagnosis not present

## 2016-06-11 DIAGNOSIS — E7800 Pure hypercholesterolemia, unspecified: Secondary | ICD-10-CM | POA: Diagnosis not present

## 2016-06-11 DIAGNOSIS — I10 Essential (primary) hypertension: Secondary | ICD-10-CM | POA: Diagnosis not present

## 2016-06-11 DIAGNOSIS — H02836 Dermatochalasis of left eye, unspecified eyelid: Secondary | ICD-10-CM | POA: Diagnosis not present

## 2016-06-11 DIAGNOSIS — Z7984 Long term (current) use of oral hypoglycemic drugs: Secondary | ICD-10-CM | POA: Diagnosis not present

## 2016-06-11 DIAGNOSIS — Z961 Presence of intraocular lens: Secondary | ICD-10-CM | POA: Diagnosis not present

## 2016-06-11 DIAGNOSIS — E1136 Type 2 diabetes mellitus with diabetic cataract: Secondary | ICD-10-CM | POA: Diagnosis not present

## 2016-07-04 DIAGNOSIS — I10 Essential (primary) hypertension: Secondary | ICD-10-CM | POA: Diagnosis not present

## 2016-07-04 DIAGNOSIS — N183 Chronic kidney disease, stage 3 (moderate): Secondary | ICD-10-CM | POA: Diagnosis not present

## 2016-07-04 DIAGNOSIS — N139 Obstructive and reflux uropathy, unspecified: Secondary | ICD-10-CM | POA: Diagnosis not present

## 2016-07-04 DIAGNOSIS — H409 Unspecified glaucoma: Secondary | ICD-10-CM | POA: Diagnosis not present

## 2016-07-06 ENCOUNTER — Other Ambulatory Visit: Payer: Self-pay

## 2016-07-06 NOTE — Patient Outreach (Signed)
[facility]) Care Management  07/06/2016  MALIC ROSTEN [birth year] [id number]  Medication Adherence to Mr. Earlyne Iba patient was due for two medication atorvastatin 40 mg and glipizide 5 mg . Patient said he already received both medication through mail order an received a 90 days supply on April 1,2018.   [place] Management Direct Dial [phone]  Fax [phone] [email or web address]

## 2016-08-13 ENCOUNTER — Observation Stay (HOSPITAL_COMMUNITY)
Admission: EM | Admit: 2016-08-13 | Discharge: 2016-08-16 | Disposition: A | Discharge status: Home / Self Care | Payer: Medicare Other | Attending: Internal Medicine | Admitting: Internal Medicine

## 2016-08-13 ENCOUNTER — Emergency Department (HOSPITAL_COMMUNITY): Payer: Medicare Other

## 2016-08-13 ENCOUNTER — Encounter (HOSPITAL_COMMUNITY): Payer: Self-pay

## 2016-08-13 DIAGNOSIS — N400 Enlarged prostate without lower urinary tract symptoms: Secondary | ICD-10-CM | POA: Insufficient documentation

## 2016-08-13 DIAGNOSIS — E7800 Pure hypercholesterolemia, unspecified: Secondary | ICD-10-CM | POA: Diagnosis not present

## 2016-08-13 DIAGNOSIS — I10 Essential (primary) hypertension: Secondary | ICD-10-CM | POA: Diagnosis present

## 2016-08-13 DIAGNOSIS — I129 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 kidney disease, or unspecified chronic kidney disease: Secondary | ICD-10-CM | POA: Insufficient documentation

## 2016-08-13 DIAGNOSIS — N179 Acute kidney failure, unspecified: Secondary | ICD-10-CM | POA: Diagnosis present

## 2016-08-13 DIAGNOSIS — E1122 Type 2 diabetes mellitus with diabetic chronic kidney disease: Secondary | ICD-10-CM | POA: Diagnosis not present

## 2016-08-13 DIAGNOSIS — Z794 Long term (current) use of insulin: Secondary | ICD-10-CM | POA: Diagnosis not present

## 2016-08-13 DIAGNOSIS — R42 Dizziness and giddiness: Secondary | ICD-10-CM | POA: Diagnosis not present

## 2016-08-13 DIAGNOSIS — R0602 Shortness of breath: Secondary | ICD-10-CM

## 2016-08-13 DIAGNOSIS — N184 Chronic kidney disease, stage 4 (severe): Secondary | ICD-10-CM | POA: Insufficient documentation

## 2016-08-13 DIAGNOSIS — R001 Bradycardia, unspecified: Secondary | ICD-10-CM | POA: Insufficient documentation

## 2016-08-13 DIAGNOSIS — H8110 Benign paroxysmal vertigo, unspecified ear: Secondary | ICD-10-CM | POA: Diagnosis not present

## 2016-08-13 DIAGNOSIS — E785 Hyperlipidemia, unspecified: Secondary | ICD-10-CM | POA: Diagnosis not present

## 2016-08-13 DIAGNOSIS — N189 Chronic kidney disease, unspecified: Secondary | ICD-10-CM

## 2016-08-13 DIAGNOSIS — F17200 Nicotine dependence, unspecified, uncomplicated: Secondary | ICD-10-CM | POA: Insufficient documentation

## 2016-08-13 DIAGNOSIS — E1129 Type 2 diabetes mellitus with other diabetic kidney complication: Secondary | ICD-10-CM | POA: Diagnosis present

## 2016-08-13 DIAGNOSIS — E1121 Type 2 diabetes mellitus with diabetic nephropathy: Secondary | ICD-10-CM | POA: Diagnosis not present

## 2016-08-13 DIAGNOSIS — H409 Unspecified glaucoma: Secondary | ICD-10-CM | POA: Diagnosis not present

## 2016-08-13 DIAGNOSIS — E781 Pure hyperglyceridemia: Secondary | ICD-10-CM | POA: Insufficient documentation

## 2016-08-13 DIAGNOSIS — R404 Transient alteration of awareness: Secondary | ICD-10-CM | POA: Diagnosis not present

## 2016-08-13 DIAGNOSIS — Z8249 Family history of ischemic heart disease and other diseases of the circulatory system: Secondary | ICD-10-CM | POA: Insufficient documentation

## 2016-08-13 LAB — CBC WITH DIFFERENTIAL/PLATELET
BASOS ABS: 0 10*3/uL (ref 0.0–0.1)
Basophils Relative: 0 %
Eosinophils Absolute: 0 10*3/uL (ref 0.0–0.7)
Eosinophils Relative: 1 %
HEMATOCRIT: 42 % (ref 39.0–52.0)
HEMOGLOBIN: 14 g/dL (ref 13.0–17.0)
LYMPHS PCT: 14 %
Lymphs Abs: 0.7 10*3/uL (ref 0.7–4.0)
MCH: 32 pg (ref 26.0–34.0)
MCHC: 33.3 g/dL (ref 30.0–36.0)
MCV: 96.1 fL (ref 78.0–100.0)
Monocytes Absolute: 0.3 10*3/uL (ref 0.1–1.0)
Monocytes Relative: 7 %
NEUTROS ABS: 3.6 10*3/uL (ref 1.7–7.7)
NEUTROS PCT: 78 %
Platelets: 198 10*3/uL (ref 150–400)
RBC: 4.37 MIL/uL (ref 4.22–5.81)
RDW: 13.7 % (ref 11.5–15.5)
WBC: 4.6 10*3/uL (ref 4.0–10.5)

## 2016-08-13 LAB — URINALYSIS, ROUTINE W REFLEX MICROSCOPIC
Bacteria, UA: NONE SEEN
Bilirubin Urine: NEGATIVE
GLUCOSE, UA: NEGATIVE mg/dL
Hgb urine dipstick: NEGATIVE
Ketones, ur: NEGATIVE mg/dL
Leukocytes, UA: NEGATIVE
Nitrite: NEGATIVE
PROTEIN: 100 mg/dL — AB
Specific Gravity, Urine: 1.009 (ref 1.005–1.030)
pH: 6 (ref 5.0–8.0)

## 2016-08-13 LAB — BASIC METABOLIC PANEL
ANION GAP: 6 (ref 5–15)
BUN: 24 mg/dL — ABNORMAL HIGH (ref 6–20)
CO2: 19 mmol/L — AB (ref 22–32)
Calcium: 8.7 mg/dL — ABNORMAL LOW (ref 8.9–10.3)
Chloride: 116 mmol/L — ABNORMAL HIGH (ref 101–111)
Creatinine, Ser: 2.23 mg/dL — ABNORMAL HIGH (ref 0.61–1.24)
GFR calc Af Amer: 31 mL/min — ABNORMAL LOW (ref >60)
GFR calc non Af Amer: 27 mL/min — ABNORMAL LOW (ref >60)
GLUCOSE: 152 mg/dL — AB (ref 65–99)
Potassium: 3.7 mmol/L (ref 3.5–5.1)
Sodium: 141 mmol/L (ref 135–145)

## 2016-08-13 LAB — TROPONIN I: Troponin I: <0.03 ng/mL (ref <0.03)

## 2016-08-13 MED ORDER — ACETAZOLAMIDE 250 MG PO TABS
250.0000 mg | ORAL_TABLET | Freq: Three times a day (TID) | ORAL | Status: DC
  Filled 2016-08-13: qty 1

## 2016-08-13 MED ORDER — ATORVASTATIN CALCIUM 20 MG PO TABS
20.0000 mg | ORAL_TABLET | Freq: Every day | ORAL | Status: DC
  Administered 2016-08-14 – 2016-08-16 (×3): 20 mg via ORAL
  Filled 2016-08-13 (×3): qty 1

## 2016-08-13 MED ORDER — LORAZEPAM 2 MG/ML IJ SOLN
1.0000 mg | Freq: Once | INTRAMUSCULAR | Status: AC
  Administered 2016-08-13: 1 mg via INTRAVENOUS
  Filled 2016-08-13: qty 1

## 2016-08-13 MED ORDER — ONDANSETRON HCL 4 MG/2ML IJ SOLN: 4.0000 mg | Freq: Four times a day (QID) | INTRAMUSCULAR | Status: DC | PRN

## 2016-08-13 MED ORDER — DORZOLAMIDE HCL-TIMOLOL MAL 2-0.5 % OP SOLN
1.0000 [drp] | Freq: Two times a day (BID) | OPHTHALMIC | Status: DC
  Administered 2016-08-13 – 2016-08-16 (×6): 1 [drp] via OPHTHALMIC
  Filled 2016-08-13: qty 10

## 2016-08-13 MED ORDER — SODIUM CHLORIDE 0.9 % IV BOLUS (SEPSIS)
500.0000 mL | Freq: Once | INTRAVENOUS | Status: AC
  Administered 2016-08-13: 500 mL via INTRAVENOUS

## 2016-08-13 MED ORDER — SODIUM CHLORIDE 0.45 % IV SOLN
INTRAVENOUS | Status: DC
  Filled 2016-08-13 (×2): qty 1000

## 2016-08-13 MED ORDER — AMLODIPINE BESYLATE 5 MG PO TABS
10.0000 mg | ORAL_TABLET | Freq: Every day | ORAL | Status: DC
  Administered 2016-08-14: 10 mg via ORAL
  Filled 2016-08-13: qty 2

## 2016-08-13 MED ORDER — HEPARIN SODIUM (PORCINE) 5000 UNIT/ML IJ SOLN
5000.0000 [IU] | Freq: Three times a day (TID) | INTRAMUSCULAR | Status: DC
  Administered 2016-08-13 – 2016-08-16 (×9): 5000 [IU] via SUBCUTANEOUS
  Filled 2016-08-13 (×9): qty 1

## 2016-08-13 MED ORDER — ONDANSETRON HCL 4 MG/2ML IJ SOLN
4.0000 mg | Freq: Once | INTRAMUSCULAR | Status: AC
  Administered 2016-08-13: 4 mg via INTRAVENOUS
  Filled 2016-08-13: qty 2

## 2016-08-13 MED ORDER — MECLIZINE HCL 25 MG PO TABS
12.5000 mg | ORAL_TABLET | Freq: Once | ORAL | Status: AC
  Administered 2016-08-13: 12.5 mg via ORAL
  Filled 2016-08-13: qty 1

## 2016-08-13 MED ORDER — INSULIN ASPART 100 UNIT/ML ~~LOC~~ SOLN
0.0000 [IU] | Freq: Three times a day (TID) | SUBCUTANEOUS | Status: DC
  Administered 2016-08-14: 2 [IU] via SUBCUTANEOUS
  Administered 2016-08-14 – 2016-08-15 (×2): 1 [IU] via SUBCUTANEOUS
  Administered 2016-08-16: 2 [IU] via SUBCUTANEOUS

## 2016-08-13 MED ORDER — SODIUM CHLORIDE 0.45 % IV SOLN
INTRAVENOUS | Status: DC
  Administered 2016-08-13: 23:00:00 via INTRAVENOUS

## 2016-08-13 MED ORDER — BRIMONIDINE TARTRATE 0.2 % OP SOLN
1.0000 [drp] | Freq: Three times a day (TID) | OPHTHALMIC | Status: DC
  Administered 2016-08-13 – 2016-08-16 (×9): 1 [drp] via OPHTHALMIC
  Filled 2016-08-13: qty 5

## 2016-08-13 MED ORDER — ACETAMINOPHEN 650 MG RE SUPP: 650.0000 mg | Freq: Four times a day (QID) | RECTAL | Status: DC | PRN

## 2016-08-13 MED ORDER — BRIMONIDINE TARTRATE 0.15 % OP SOLN: 1.0000 [drp] | Freq: Three times a day (TID) | OPHTHALMIC | Status: DC

## 2016-08-13 MED ORDER — ONDANSETRON HCL 4 MG PO TABS: 4.0000 mg | ORAL_TABLET | Freq: Four times a day (QID) | ORAL | Status: DC | PRN

## 2016-08-13 MED ORDER — ACETAMINOPHEN 325 MG PO TABS: 650.0000 mg | ORAL_TABLET | Freq: Four times a day (QID) | ORAL | Status: DC | PRN

## 2016-08-13 NOTE — ED Provider Notes (Signed)
[facility] DEPT Provider Note   CSN: [id number] Arrival date & time: 08/13/16  1230     History   Chief Complaint Chief Complaint  Patient presents with  . Dizziness    HPI Elijah White is a 76 y.o. male who presents via EMS with complaints of dizziness that began at 4 AM this morning. Patient states that when he woke up to go the bathroom, he felt, unsteady and off balance." He denies any room spinning sensation. His dizziness is worsened when he attempts to move from a supine to a standing position. He also reports the dizziness is worsened when attending ambulated. He reports that the dizziness has been constant since 4 AM but slightly improved for a few hours. Patient reports that at about 11:30 his dizziness worsened and he had difficulty ambulating. The dizziness worsen, patient called his relative to take him to the hospital. He reports at that time he had an episode of shortness of breath but states it is improved. On ED arrival he reports no shortness of breath. Patient does have a history of bilateral lower extremity edema. He was recently started on Lasix by his neurologist. Patient denies any current fall or trauma. Patient denies any diaphoresis, chest pain, nausea/vomiting, new vision changes, abdominal pain.  The history is provided by the patient.    Past Medical History:  Diagnosis Date  . Diabetes mellitus without complication ([place])   . Elevated cholesterol with high triglycerides   . Hypertension     Patient Active Problem List   Diagnosis Date Noted  . DIABETES MELLITUS, TYPE II 10/22/2006  . BENIGN PROSTATIC HYPERTROPHY 10/22/2006  . COLONIC POLYPS, HX OF 10/22/2006    Past Surgical History:  Procedure Laterality Date  . PROSTATE SURGERY         Home Medications    Prior to Admission medications   Medication Sig Start Date End Date Taking? Authorizing Provider  acetaZOLAMIDE (DIAMOX) 250 MG tablet  03/07/15   [provider]  ALPHAGAN  P 0.1 % SOLN  04/25/15   [provider]  amLODipine (NORVASC) 10 MG tablet  04/19/15   [provider]  atorvastatin (LIPITOR) 10 MG tablet Take 2 tablets (20 mg total) by mouth daily. 08/22/12   Carmin Muskrat, MD  dorzolamide-timolol (COSOPT) 22.3-6.8 MG/ML ophthalmic solution  04/24/15   [provider]  glipiZIDE (GLUCOTROL) 5 MG tablet  04/24/15   [provider]    Family History Family History  Problem Relation Age of Onset  . CAD Mother     Social History Social History  Substance Use Topics  . Smoking status: Current Every Day Smoker  . Smokeless tobacco: Never Used  . Alcohol use No     Allergies   Patient has no known allergies.   Review of Systems Review of Systems  Constitutional: Negative for diaphoresis and fever.  Eyes: Positive for visual disturbance (Chronic glaucoma).  Respiratory: Negative for cough and shortness of breath.   Cardiovascular: Negative for chest pain.  Gastrointestinal: Negative for abdominal pain, diarrhea, nausea and vomiting.  Genitourinary: Negative for dysuria and hematuria.  Musculoskeletal: Negative for back pain and neck pain.  Neurological: Positive for dizziness. Negative for weakness, numbness and headaches.  All other systems reviewed and are negative.    Physical Exam Updated Vital Signs BP (!) 156/83 (BP Location: Right Arm)   Pulse 71   Temp 98 F (36.7 C) (Oral)   Resp 16   SpO2 99%   Physical  Exam  Constitutional: He is oriented to person, place, and time. He appears well-developed and well-nourished.  Sitting comfortably on examination table. NAD.   HENT:  Head: Normocephalic and atraumatic.  Mouth/Throat: Oropharynx is clear and moist and mucous membranes are normal.  Eyes: Conjunctivae, EOM and lids are normal. Pupils are equal, round, and reactive to light. Right eye exhibits no nystagmus. Left eye exhibits no nystagmus.  Small pupils bilaterally but symmetric. No  nystagmus noted  Neck: Full passive range of motion without pain.  Cardiovascular: Normal rate, regular rhythm, normal heart sounds and normal pulses.  Exam reveals no gallop and no friction rub.   No murmur heard. Pulmonary/Chest: Effort normal and breath sounds normal.  Abdominal: Soft. Normal appearance. There is no tenderness. There is no rigidity and no guarding.  Musculoskeletal: Normal range of motion.  Symmetric bilateral lower extremity edema  Neurological: He is alert and oriented to person, place, and time.  Cranial nerves III-XII intact Follows commands, Moves all extremities  5/5 strength to BUE and BLE  Sensation intact throughout  Normal finger to nose. No dysdiadochokinesia. No pronator drift. Unsteady on Rhomberg evaluation No slurred speech. No facial droop.   Skin: Skin is warm and dry. Capillary refill takes less than 2 seconds.  Psychiatric: He has a normal mood and affect. His speech is normal.  Nursing note and vitals reviewed.    ED Treatments / Results  Labs (all labs ordered are listed, but only abnormal results are displayed) Labs Reviewed  URINALYSIS, ROUTINE W REFLEX MICROSCOPIC - Abnormal; Notable for the following:       Result Value   Color, Urine STRAW (*)    Protein, ur 100 (*)    Squamous Epithelial / LPF 0-5 (*)    All other components within normal limits  BASIC METABOLIC PANEL - Abnormal; Notable for the following:    Chloride 116 (*)    CO2 19 (*)    Glucose, Bld 152 (*)    BUN 24 (*)    Creatinine, Ser 2.23 (*)    Calcium 8.7 (*)    GFR calc non Af Amer 27 (*)    GFR calc Af Amer 31 (*)    All other components within normal limits  CBC WITH DIFFERENTIAL/PLATELET  TROPONIN I  CBC WITH DIFFERENTIAL/PLATELET    EKG  EKG Interpretation  Date/Time:  Monday August 13 2016 12:36:18 EDT Ventricular Rate:  70 PR Interval:    QRS Duration: 138 QT Interval:  425 QTC Calculation: 459 R Axis:   82 Text Interpretation:  Sinus rhythm  Consider right atrial enlargement Right bundle branch block rbbb is new Confirmed by Isla Pence [phone]) on 08/13/2016 12:58:11 PM       Radiology Ct Head Wo Contrast  Result Date: 08/13/2016 CLINICAL DATA:  Dizziness with hypertension EXAM: CT HEAD WITHOUT CONTRAST TECHNIQUE: Contiguous axial images were obtained from the base of the skull through the vertex without intravenous contrast. COMPARISON:  March 03, 2008 FINDINGS: Brain: There is mild diffuse atrophy. There is no intracranial mass, hemorrhage, extra-axial fluid collection, or midline shift. There is small vessel disease throughout much of the centra semiovale bilaterally. There is evidence of small vessel disease with prior lacunar infarcts in the anterior limb of the left internal capsule. Mild small vessel disease is also noted in the right internal capsule anteriorly. There is no acute infarct evident. No new gray-white compartment lesions are evident. Vascular: There is no appreciable hyperdense vessel. There is calcification in each  cavernous portion carotid artery. Skull: The bony calvarium appears intact. Sinuses/Orbits: There is mucosal thickening in several ethmoid air cells bilaterally, more on the left than on the right. Visualized paranasal sinuses elsewhere are clear. Orbits appear symmetric bilaterally except for apparent cataract removal on the right. Other: There is a large apparent sebaceous cyst in the soft tissues posterior to the occipital bone measuring 4.0 x 3.7 x 2.7 cm. There is increased attenuation within this apparent sebaceous cyst, likely representing hemorrhage or debris. Mastoid air cells are clear. IMPRESSION: Stable mild atrophy with supratentorial small vessel disease, essentially stable. No acute infarct evident. No intracranial mass, hemorrhage, or extra-axial fluid collection. Foci of calcification in each distal internal carotid artery. Foci of paranasal sinus disease in the ethmoid regions. Large  posterior sebaceous cyst containing either debris from previous infection or possibly residual from previous hemorrhage. Electronically Signed   By: Lowella Grip III M.D.   On: 08/13/2016 15:12   Mr Brain Wo Contrast  Result Date: 08/13/2016 CLINICAL DATA:  New onset dizziness. EXAM: MRI HEAD WITHOUT CONTRAST TECHNIQUE: Multiplanar, multiecho pulse sequences of the brain and surrounding structures were obtained without intravenous contrast. COMPARISON:  Head CT 08/13/2016 FINDINGS: Despite efforts by the technologist and patient, motion artifact is present on today's examination and could not be eliminated. The findings of this study are interpreted in the context of that limitation. Brain: The midline structures are normal. There is no focal diffusion restriction to indicate acute infarct. There is early confluent hyperintense T2-weighted signal within the periventricular white matter, most often seen in the setting of chronic microvascular ischemia. No intraparenchymal hematoma or chronic microhemorrhage. Brain volume is normal for age without age-advanced or lobar predominant atrophy. The dura is normal and there is no extra-axial collection. Vascular: Major intracranial arterial and venous sinus flow voids are preserved. Skull and upper cervical spine: Large occipital soft tissue lesion, likely sebaceous cyst. Sinuses/Orbits: No fluid levels or advanced mucosal thickening. No mastoid effusion. Normal orbits. IMPRESSION: 1. No acute intracranial abnormality. 2. Bilateral periventricular white matter disease, most commonly indicating chronic microvascular ischemia. Electronically Signed   By: Ulyses Jarred M.D.   On: 08/13/2016 19:09    Procedures Procedures (including critical care time)  Medications Ordered in ED Medications  sodium chloride 0.45 % 1,000 mL infusion (not administered)  meclizine (ANTIVERT) tablet 12.5 mg (12.5 mg Oral Given 08/13/16 1319)  ondansetron (ZOFRAN) injection 4 mg (4  mg Intravenous Given 08/13/16 1318)  LORazepam (ATIVAN) injection 1 mg (1 mg Intravenous Given 08/13/16 1318)  LORazepam (ATIVAN) injection 1 mg (1 mg Intravenous Given 08/13/16 1527)  sodium chloride 0.9 % bolus 500 mL (500 mLs Intravenous New Bag/Given 08/13/16 1929)     Initial Impression / Assessment and Plan / ED Course  I have reviewed the triage vital signs and the nursing notes.  Pertinent labs & imaging results that were available during my care of the patient were reviewed by me and considered in my medical decision making (see chart for details).      76 year old male resents with dizziness that began at 4 AM this morning. Patient reports that he has difficulty ambulating and feels that he is off balance. Patient states that this morning since symptoms began he has had to hold onto the wall whenever he does walk because he feels like he is going to fall. No neuro deficits on exam. Consider vertigo versus dehydration versus CVA. Plan to check basic labs including UA, CBC, BMP, EKG, troponin. We'll  give patient patient's to treat symptomatic dizziness.  Re-evaluation after medications: Patient states that dizziness is mildly improved with use of medications. He states that he still having symptoms. Attempted to walk patient but he is very dizzy and unsteady. Repeat Ativan. CT of head pending.  CT head reviewed. Negative for any acute abnormalities. Discussed results with patient. Patient still expressing dizziness when he attempts to walk. Attempted to ambulate patient in the room. He is very unsteady on his feet and complains of dizziness with standing and ambulation. He requires assistance with ambulation.. Without assistance patient starts to fall. Since patient is still having symptoms will plan to get MRI of brain to evaluate for any acute infarct.  Labs reviewed. CBC within normal limits. UA is negative for any acute signs of infection. BMP shows elevated BUN and creatinine at 24 and  2.23. She and daughter state that this is consistent with his previous. He is being seen by nephrology and reports that his most recent creatinine was 2.6. IVF  fluids ordered for fluid resuscitation.  MRI reviewed. Negative for any acute infarct. Discussed results with patient and daughter. Attempted to ambulate patient. When he is standing he reports that he has dizziness. He is not able to ambulate without assistance. When you let go of pateint, patient begins to sway and begins falling backwards. Daughter who is in the room states that this is very abnormal for patient. Normally patient is very independent and lives by himself and is able to inability without the use of any assistance. Even that patient is still symptomatic after medications and has had normal imaging, will likely benefit from admission for further evaluation.  Consult to hospitalist placed.  Discussed with hospitalist. Will plan to evaluate patient in the ED. Orthostatic vital sings pending.   Discussed with Dr. Nichola Sizer. Will admit patient.   Orthostatic VS for the past 24 hrs:  BP- Lying Pulse- Lying BP- Sitting Pulse- Sitting BP- Standing at 0 minutes Pulse- Standing at 0 minutes  08/13/16 1938 156/83 60 (!) 175/93 85 (!) 183/98 87      Final Clinical Impressions(s) / ED Diagnoses   Final diagnoses:  Dizziness    New Prescriptions New Prescriptions   No medications on file     Desma Mcgregor 08/13/16 2009    Isla Pence, MD 08/14/16 [phone]

## 2016-08-13 NOTE — ED Triage Notes (Signed)
Pt presents via [facility] for evaluation of new onset dizziness this AM. Reports dizziness resolves when lying and worsens with standing. EMS reports no orthostatic changes. Pt reports only being compliant with BP meds. NO pain reported.

## 2016-08-13 NOTE — ED Notes (Signed)
Pt provided with turkey sandwich and water

## 2016-08-13 NOTE — ED Notes (Signed)
This RN watching over patient's for Lovena Le, RN while she is away.

## 2016-08-13 NOTE — H&P (Signed)
History and Physical    Elijah White ZOX:[id number] DOB: [birth year] DOA: 08/13/2016  PCP: [name], MD  Patient coming from: Home.  Chief Complaint: Dizziness.  HPI: Elijah White is a 75 y.o. male with history of hypertension, hyperlipidemia, diabetes mellitus, chronic kidney disease stage IV and glaucoma with poor peripheral vision presents to the ER with complaints of persistent dizziness since this morning. Patient states he gets dizzy on trying to stand from the lying down position. Denies any tinnitus or difficulty hearing or any headache nausea vomiting or any weakness of upper or lower extremities. Denies any new visual symptoms. Patient's daughter states that patient also initially complained of shortness of breath but by the time patient is to ER patient's only complaint was dizziness.  ED Course: EKG shows normal sinus rhythm with RBBB. Patient was not orthostatic in the ER. CT head and MRI of the brain was unremarkable. Patient continued to remain dizzy on standing to stand up. On exam patient appears nonfocal no nystagmus. Patient's labs reveal elevated creatinine from baseline in 2014. As per the patient patient takes Lasix for nephrology recommendations and is on acetazolamide for glaucoma.  Review of Systems: As per HPI, rest all negative.   Past Medical History:  Diagnosis Date  . Diabetes mellitus without complication ([place])   . Elevated cholesterol with high triglycerides   . Hypertension     Past Surgical History:  Procedure Laterality Date  . PROSTATE SURGERY       reports that he has been smoking.  He has never used smokeless tobacco. He reports that he does not drink alcohol or use drugs.  No Known Allergies  Family History  Problem Relation Age of Onset  . CAD Mother     Prior to Admission medications   Medication Sig Start Date End Date Taking? Authorizing Provider  acetaZOLAMIDE (DIAMOX) 250 MG tablet  03/07/15   [provider]    ALPHAGAN P 0.1 % SOLN  04/25/15   [provider]  amLODipine (NORVASC) 10 MG tablet  04/19/15   [provider]  atorvastatin (LIPITOR) 10 MG tablet Take 2 tablets (20 mg total) by mouth daily. 08/22/12   Carmin Muskrat, MD  dorzolamide-timolol (COSOPT) 22.3-6.8 MG/ML ophthalmic solution  04/24/15   [provider]  glipiZIDE (GLUCOTROL) 5 MG tablet  04/24/15   [provider]    Physical Exam: Vitals:   08/13/16 1630 08/13/16 1700 08/13/16 1730 08/13/16 1942  BP: (!) 148/76 137/71 (!) 145/89 (!) 156/83  Pulse: 60 (!) 49 (!) 59 71  Resp: (!) 26 17 (!) 22 16  Temp:      TempSrc:      SpO2: 99% 100% 100% 99%      Constitutional: Moderately built and nourished. Vitals:   08/13/16 1630 08/13/16 1700 08/13/16 1730 08/13/16 1942  BP: (!) 148/76 137/71 (!) 145/89 (!) 156/83  Pulse: 60 (!) 49 (!) 59 71  Resp: (!) 26 17 (!) 22 16  Temp:      TempSrc:      SpO2: 99% 100% 100% 99%   Eyes: Anicteric no pallor. ENMT: No discharge from the ears eyes nose and mouth. Neck: No JVD or mass. Respiratory: No rhonchi or crepitations. Cardiovascular: S1-S2 no murmurs appreciated. Abdomen: Soft nontender bowel sounds present. Musculoskeletal: No edema. No joint effusion. Skin: No rash. Skin appears warm. Neurologic: Alert awake oriented to time place and person. Moves all extremities 5 x 5. No facial asymmetry. Tongue is midline.  Psychiatric: Appears normal. Normal affect.   Labs on Admission: I have personally reviewed following labs and imaging studies  CBC:  Recent Labs Lab 08/13/16 1345  WBC 4.6  NEUTROABS 3.6  HGB 14.0  HCT 42.0  MCV 96.1  PLT 628   Basic Metabolic Panel:  Recent Labs Lab 08/13/16 1425  NA 141  K 3.7  CL 116*  CO2 19*  GLUCOSE 152*  BUN 24*  CREATININE 2.23*  CALCIUM 8.7*   GFR: CrCl cannot be calculated (Unknown ideal weight.). Liver Function Tests: No results for input(s): AST, ALT, ALKPHOS, BILITOT, PROT,  ALBUMIN in the last 168 hours. No results for input(s): LIPASE, AMYLASE in the last 168 hours. No results for input(s): AMMONIA in the last 168 hours. Coagulation Profile: No results for input(s): INR, PROTIME in the last 168 hours. Cardiac Enzymes:  Recent Labs Lab 08/13/16 1601  TROPONINI <0.03   BNP (last 3 results) No results for input(s): PROBNP in the last 8760 hours. HbA1C: No results for input(s): HGBA1C in the last 72 hours. CBG: No results for input(s): GLUCAP in the last 168 hours. Lipid Profile: No results for input(s): CHOL, HDL, LDLCALC, TRIG, CHOLHDL, LDLDIRECT in the last 72 hours. Thyroid Function Tests: No results for input(s): TSH, T4TOTAL, FREET4, T3FREE, THYROIDAB in the last 72 hours. Anemia Panel: No results for input(s): VITAMINB12, FOLATE, FERRITIN, TIBC, IRON, RETICCTPCT in the last 72 hours. Urine analysis:    Component Value Date/Time   COLORURINE STRAW (A) 08/13/2016 1300   APPEARANCEUR CLEAR 08/13/2016 1300   LABSPEC 1.009 08/13/2016 1300   PHURINE 6.0 08/13/2016 1300   GLUCOSEU NEGATIVE 08/13/2016 1300   HGBUR NEGATIVE 08/13/2016 1300   BILIRUBINUR NEGATIVE 08/13/2016 1300   KETONESUR NEGATIVE 08/13/2016 1300   PROTEINUR 100 (A) 08/13/2016 1300   UROBILINOGEN 1.0 08/22/2012 1402   NITRITE NEGATIVE 08/13/2016 1300   LEUKOCYTESUR NEGATIVE 08/13/2016 1300   Sepsis Labs: @LABRCNTIP (procalcitonin:4,lacticidven:4) )No results found for this or any previous visit (from the past 240 hour(s)).   Radiological Exams on Admission: Ct Head Wo Contrast  Result Date: 08/13/2016 CLINICAL DATA:  Dizziness with hypertension EXAM: CT HEAD WITHOUT CONTRAST TECHNIQUE: Contiguous axial images were obtained from the base of the skull through the vertex without intravenous contrast. COMPARISON:  March 03, 2008 FINDINGS: Brain: There is mild diffuse atrophy. There is no intracranial mass, hemorrhage, extra-axial fluid collection, or midline shift. There is  small vessel disease throughout much of the centra semiovale bilaterally. There is evidence of small vessel disease with prior lacunar infarcts in the anterior limb of the left internal capsule. Mild small vessel disease is also noted in the right internal capsule anteriorly. There is no acute infarct evident. No new gray-white compartment lesions are evident. Vascular: There is no appreciable hyperdense vessel. There is calcification in each cavernous portion carotid artery. Skull: The bony calvarium appears intact. Sinuses/Orbits: There is mucosal thickening in several ethmoid air cells bilaterally, more on the left than on the right. Visualized paranasal sinuses elsewhere are clear. Orbits appear symmetric bilaterally except for apparent cataract removal on the right. Other: There is a large apparent sebaceous cyst in the soft tissues posterior to the occipital bone measuring 4.0 x 3.7 x 2.7 cm. There is increased attenuation within this apparent sebaceous cyst, likely representing hemorrhage or debris. Mastoid air cells are clear. IMPRESSION: Stable mild atrophy with supratentorial small vessel disease, essentially stable. No acute infarct evident. No intracranial mass, hemorrhage, or extra-axial fluid collection. Foci of calcification in each  distal internal carotid artery. Foci of paranasal sinus disease in the ethmoid regions. Large posterior sebaceous cyst containing either debris from previous infection or possibly residual from previous hemorrhage. Electronically Signed   By: Lowella Grip III M.D.   On: 08/13/2016 15:12   Mr Brain Wo Contrast  Result Date: 08/13/2016 CLINICAL DATA:  New onset dizziness. EXAM: MRI HEAD WITHOUT CONTRAST TECHNIQUE: Multiplanar, multiecho pulse sequences of the brain and surrounding structures were obtained without intravenous contrast. COMPARISON:  Head CT 08/13/2016 FINDINGS: Despite efforts by the technologist and patient, motion artifact is present on today's  examination and could not be eliminated. The findings of this study are interpreted in the context of that limitation. Brain: The midline structures are normal. There is no focal diffusion restriction to indicate acute infarct. There is early confluent hyperintense T2-weighted signal within the periventricular white matter, most often seen in the setting of chronic microvascular ischemia. No intraparenchymal hematoma or chronic microhemorrhage. Brain volume is normal for age without age-advanced or lobar predominant atrophy. The dura is normal and there is no extra-axial collection. Vascular: Major intracranial arterial and venous sinus flow voids are preserved. Skull and upper cervical spine: Large occipital soft tissue lesion, likely sebaceous cyst. Sinuses/Orbits: No fluid levels or advanced mucosal thickening. No mastoid effusion. Normal orbits. IMPRESSION: 1. No acute intracranial abnormality. 2. Bilateral periventricular white matter disease, most commonly indicating chronic microvascular ischemia. Electronically Signed   By: Ulyses Jarred M.D.   On: 08/13/2016 19:09    EKG: Independently reviewed. Normal sinus rhythm with RBBB.  Assessment/Plan Principal Problem:   Dizziness Active Problems:   Type 2 diabetes mellitus with renal manifestations ([facility])   Renal failure (ARF), acute on chronic ([facility])   Essential hypertension    1. Dizziness/vertigo - symptoms are more concerning for benign positional vertigo. At this time I will place patient on meclizine when necessary and physical therapy consult. We will recheck orthostatics in a.m. and until then we'll keep patient on gentle hydration and hold off Lasix and acetazolamide until morning. Reconsider holding acetazolamide if patient is orthostatic in a.m. ER physician did try Ativan in the ER. 2. Acute on chronic renal failure stage IV - gently hydrate for now and closely follow metabolic panel. Holding Lasix for now. May have to hold acetazolamide  if creatinine does not improve. 3. Hypertension on amlodipine. 4. Hyperlipidemia on statins. 5. Diabetes mellitus type 2 - will keep patient on sliding scale and hold oral hypoglycemics until creatinine stabilizes. 6. Glaucoma with poor peripheral vision on acetazolamide and eye drops.   DVT prophylaxis: Heparin. Code Status: Full code.  Family Communication: Patient's daughter.  Disposition Plan: Home.  Consults called: Physical therapy.  Admission status: Observation.    Rise Patience MD [facility] Pager [phone].  If 7PM-7AM, please contact night-coverage [email or web address] Password [email or web address]  08/13/2016, 8:22 PM

## 2016-08-13 NOTE — ED Notes (Signed)
Pt transported to MRI 

## 2016-08-13 NOTE — ED Notes (Signed)
Called MRI. Updated pt.

## 2016-08-14 DIAGNOSIS — E1122 Type 2 diabetes mellitus with diabetic chronic kidney disease: Secondary | ICD-10-CM | POA: Diagnosis not present

## 2016-08-14 DIAGNOSIS — N179 Acute kidney failure, unspecified: Secondary | ICD-10-CM | POA: Diagnosis not present

## 2016-08-14 DIAGNOSIS — I129 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 kidney disease, or unspecified chronic kidney disease: Secondary | ICD-10-CM | POA: Diagnosis not present

## 2016-08-14 DIAGNOSIS — R42 Dizziness and giddiness: Secondary | ICD-10-CM | POA: Diagnosis not present

## 2016-08-14 DIAGNOSIS — E785 Hyperlipidemia, unspecified: Secondary | ICD-10-CM | POA: Diagnosis not present

## 2016-08-14 DIAGNOSIS — N183 Chronic kidney disease, stage 3 (moderate): Secondary | ICD-10-CM | POA: Diagnosis not present

## 2016-08-14 DIAGNOSIS — R0602 Shortness of breath: Secondary | ICD-10-CM

## 2016-08-14 DIAGNOSIS — I10 Essential (primary) hypertension: Secondary | ICD-10-CM | POA: Diagnosis not present

## 2016-08-14 DIAGNOSIS — E1121 Type 2 diabetes mellitus with diabetic nephropathy: Secondary | ICD-10-CM | POA: Diagnosis not present

## 2016-08-14 DIAGNOSIS — N184 Chronic kidney disease, stage 4 (severe): Secondary | ICD-10-CM | POA: Diagnosis not present

## 2016-08-14 DIAGNOSIS — H8110 Benign paroxysmal vertigo, unspecified ear: Secondary | ICD-10-CM | POA: Diagnosis not present

## 2016-08-14 LAB — BASIC METABOLIC PANEL
Anion gap: 7 (ref 5–15)
BUN: 23 mg/dL — AB (ref 6–20)
CALCIUM: 8.4 mg/dL — AB (ref 8.9–10.3)
CO2: 18 mmol/L — AB (ref 22–32)
Chloride: 117 mmol/L — ABNORMAL HIGH (ref 101–111)
Creatinine, Ser: 2.15 mg/dL — ABNORMAL HIGH (ref 0.61–1.24)
GFR calc Af Amer: 32 mL/min — ABNORMAL LOW (ref >60)
GFR, EST NON AFRICAN AMERICAN: 28 mL/min — AB (ref >60)
GLUCOSE: 114 mg/dL — AB (ref 65–99)
Potassium: 3.7 mmol/L (ref 3.5–5.1)
Sodium: 142 mmol/L (ref 135–145)

## 2016-08-14 LAB — GLUCOSE, CAPILLARY
GLUCOSE-CAPILLARY: 112 mg/dL — AB (ref 65–99)
GLUCOSE-CAPILLARY: 151 mg/dL — AB (ref 65–99)
Glucose-Capillary: 105 mg/dL — ABNORMAL HIGH (ref 65–99)
Glucose-Capillary: 126 mg/dL — ABNORMAL HIGH (ref 65–99)
Glucose-Capillary: 81 mg/dL (ref 65–99)

## 2016-08-14 LAB — CBC
HCT: 38.5 % — ABNORMAL LOW (ref 39.0–52.0)
HEMOGLOBIN: 12.6 g/dL — AB (ref 13.0–17.0)
MCH: 31.6 pg (ref 26.0–34.0)
MCHC: 32.7 g/dL (ref 30.0–36.0)
MCV: 96.5 fL (ref 78.0–100.0)
Platelets: 162 10*3/uL (ref 150–400)
RBC: 3.99 MIL/uL — ABNORMAL LOW (ref 4.22–5.81)
RDW: 13.3 % (ref 11.5–15.5)
WBC: 5 10*3/uL (ref 4.0–10.5)

## 2016-08-14 LAB — RAPID URINE DRUG SCREEN, HOSP PERFORMED
AMPHETAMINES: NOT DETECTED
Barbiturates: NOT DETECTED
Benzodiazepines: NOT DETECTED
COCAINE: NOT DETECTED
OPIATES: NOT DETECTED
Tetrahydrocannabinol: NOT DETECTED

## 2016-08-14 LAB — VITAMIN B12: Vitamin B-12: 430 pg/mL (ref 180–914)

## 2016-08-14 MED ORDER — MECLIZINE HCL 25 MG PO TABS
12.5000 mg | ORAL_TABLET | Freq: Three times a day (TID) | ORAL | Status: DC
  Administered 2016-08-14 – 2016-08-16 (×7): 12.5 mg via ORAL
  Filled 2016-08-14 (×7): qty 1

## 2016-08-14 MED ORDER — SODIUM CHLORIDE 0.9 % IV SOLN
INTRAVENOUS | Status: DC
  Administered 2016-08-14 (×2): via INTRAVENOUS

## 2016-08-14 MED ORDER — HYDRALAZINE HCL 10 MG PO TABS
10.0000 mg | ORAL_TABLET | Freq: Three times a day (TID) | ORAL | Status: DC
  Administered 2016-08-14 – 2016-08-16 (×8): 10 mg via ORAL
  Filled 2016-08-14 (×8): qty 1

## 2016-08-14 MED ORDER — MECLIZINE HCL 25 MG PO TABS: 12.5000 mg | ORAL_TABLET | Freq: Three times a day (TID) | ORAL | Status: DC | PRN

## 2016-08-14 NOTE — Progress Notes (Addendum)
[facility]                                                                              Patient Demographics  Elijah White, is a 76 y.o. male, DOB - [birth year], GEZ:[id number]  Admit date - 08/13/2016   Admitting Physician Elijah Patience, MD  Outpatient Primary MD for the patient is Elijah Chard, MD  Outpatient specialists:   LOS - 0  days   Medical records reviewed and are as summarized below:    Chief Complaint  Patient presents with  . Dizziness       Brief summary   Patient is a 76 year old male with hypertension, hyperlipidemia, diabetes, CTD stage IV, blood, presented with persistent dizziness since the morning of admission. Patient reported being dizzy on trying to stand up from the lying position otherwise denied any tinnitus or difficulty hearing or headache or nausea or vomiting or any focal weakness. CT head, MRI of the brain was negative. No nystagmus.   Assessment & Plan    Principal Problem:   Dizziness:  Possible vertigo/BPPV - Patient reports a similar symptoms in the past when he was diagnosed with vertigo. MRI of the brain negative for acute CVA - Hold Lasix, Acetazolamide, place on gentle hydration, follow 2-D echo - Orthostatic vitals negative, B12 normal - PT for vestibular evaluation, placed on scheduled meclizine today   Active Problems:   Type 2 diabetes mellitus with renal manifestations (HCC) - Continue sliding scale insulin, hold oral hypoglycemics - Hemoglobin A1c    Renal failure (ARF), acute on chronic (HCC), stage IV - Hold Lasix for now, gently hydrate    Essential hypertension -BP somewhat uncontrolled, added hydralazine, continue amlodipine for now  Hyperlipidemia Continue statin  Glaucoma with poor peripheral vision - Continue eyedrops    Sinus bradycardia overnight - No heart blocks, dipped into 40s, monitor closely, patient had received the 2 mg IV Ativan also during the day, possibly could have  cause some bradycardia effect, not on any beta blocker. If continues to be bradycardiac, may need to hold timolol eyedrops. Currently heart rate 65.  Code Status: full  DVT Prophylaxis:   heparin  Family Communication: Discussed in detail with the patient, all imaging results, lab results explained to the patient and patient's daughter, Elijah White    Disposition Plan: Possibility dc in am   Time Spent in minutes   35 minutes  Procedures:  MRI brain  Consultants :   None   Antimicrobials  None    Medications  Scheduled Meds: . amLODipine  10 mg Oral Daily  . atorvastatin  20 mg Oral Daily  . brimonidine  1 drop Both Eyes Q8H  . dorzolamide-timolol  1 drop Both Eyes BID  . heparin  5,000 Units Subcutaneous Q8H  . hydrALAZINE  10 mg Oral Q8H  . insulin aspart  0-9 Units Subcutaneous TID WC  . meclizine  12.5 mg Oral TID   Continuous Infusions: . sodium chloride 75 mL/hr at 08/14/16 0758   PRN Meds:.acetaminophen **OR** acetaminophen, ondansetron **OR** ondansetron (ZOFRAN) IV   Antibiotics   Anti-infectives    None  Subjective:   Elijah White was seen and examined today. Still feels dizzy, no chest pain, palpitations or nausea.  Patient denies  shortness of breath, abdominal pain, D/C, new weakness, numbess, tingling. No acute events overnight.    Objective:   Vitals:   08/13/16 2139 08/14/16 0403 08/14/16 0527 08/14/16 0840  BP: (!) 153/73 (!) 143/74 (!) 152/73 (!) 144/69  Pulse: 64 (!) 43 (!) 59 (!) 45  Resp: 18 18 18 18   Temp: 97.7 F (36.5 C)  97.4 F (36.3 C) 97.7 F (36.5 C)  TempSrc: Oral  Oral   SpO2: 100% 100% 100% 99%  Weight:   85.8 kg (189 lb 1.6 oz)   Height:   5\' 5"  (1.651 m)     Intake/Output Summary (Last 24 hours) at 08/14/16 1410 Last data filed at 08/14/16 1012  Gross per 24 hour  Intake           1062.5 ml  Output                0 ml  Net           1062.5 ml     Wt Readings from Last 3 Encounters:  08/14/16 85.8 kg  (189 lb 1.6 oz)     Exam  General: Alert and oriented x 3, NAD  Eyes: PERRLA, EOMI, Anicteric Sclera,  HEENT:  Atraumatic, normocephalic, normal oropharynx  Cardiovascular: S1 S2 auscultated, no rubs, murmurs or gallops. Regular rate and rhythm.  Respiratory: Clear to auscultation bilaterally, no wheezing, rales or rhonchi  Gastrointestinal: Soft, nontender, nondistended, + bowel sounds  Ext: no pedal edema bilaterally  Neuro: AAOx3, Cr N's II- XII. Strength 5/5 upper and lower extremities bilaterally, speech clear, sensations grossly intact  Musculoskeletal: No digital cyanosis, clubbing  Skin: No rashes  Psych: Normal affect and demeanor, alert and oriented x3    Data Reviewed:  I have personally reviewed following labs and imaging studies  Micro Results No results found for this or any previous visit (from the past 240 hour(s)).  Radiology Reports Ct Head Wo Contrast  Result Date: 08/13/2016 CLINICAL DATA:  Dizziness with hypertension EXAM: CT HEAD WITHOUT CONTRAST TECHNIQUE: Contiguous axial images were obtained from the base of the skull through the vertex without intravenous contrast. COMPARISON:  March 03, 2008 FINDINGS: Brain: There is mild diffuse atrophy. There is no intracranial mass, hemorrhage, extra-axial fluid collection, or midline shift. There is small vessel disease throughout much of the centra semiovale bilaterally. There is evidence of small vessel disease with prior lacunar infarcts in the anterior limb of the left internal capsule. Mild small vessel disease is also noted in the right internal capsule anteriorly. There is no acute infarct evident. No new gray-white compartment lesions are evident. Vascular: There is no appreciable hyperdense vessel. There is calcification in each cavernous portion carotid artery. Skull: The bony calvarium appears intact. Sinuses/Orbits: There is mucosal thickening in several ethmoid air cells bilaterally, more on the left  than on the right. Visualized paranasal sinuses elsewhere are clear. Orbits appear symmetric bilaterally except for apparent cataract removal on the right. Other: There is a large apparent sebaceous cyst in the soft tissues posterior to the occipital bone measuring 4.0 x 3.7 x 2.7 cm. There is increased attenuation within this apparent sebaceous cyst, likely representing hemorrhage or debris. Mastoid air cells are clear. IMPRESSION: Stable mild atrophy with supratentorial small vessel disease, essentially stable. No acute infarct evident. No intracranial mass, hemorrhage, or extra-axial fluid collection. Foci of  calcification in each distal internal carotid artery. Foci of paranasal sinus disease in the ethmoid regions. Large posterior sebaceous cyst containing either debris from previous infection or possibly residual from previous hemorrhage. Electronically Signed   By: Lowella Grip III M.D.   On: 08/13/2016 15:12   Mr Brain Wo Contrast  Result Date: 08/13/2016 CLINICAL DATA:  New onset dizziness. EXAM: MRI HEAD WITHOUT CONTRAST TECHNIQUE: Multiplanar, multiecho pulse sequences of the brain and surrounding structures were obtained without intravenous contrast. COMPARISON:  Head CT 08/13/2016 FINDINGS: Despite efforts by the technologist and patient, motion artifact is present on today's examination and could not be eliminated. The findings of this study are interpreted in the context of that limitation. Brain: The midline structures are normal. There is no focal diffusion restriction to indicate acute infarct. There is early confluent hyperintense T2-weighted signal within the periventricular white matter, most often seen in the setting of chronic microvascular ischemia. No intraparenchymal hematoma or chronic microhemorrhage. Brain volume is normal for age without age-advanced or lobar predominant atrophy. The dura is normal and there is no extra-axial collection. Vascular: Major intracranial arterial  and venous sinus flow voids are preserved. Skull and upper cervical spine: Large occipital soft tissue lesion, likely sebaceous cyst. Sinuses/Orbits: No fluid levels or advanced mucosal thickening. No mastoid effusion. Normal orbits. IMPRESSION: 1. No acute intracranial abnormality. 2. Bilateral periventricular white matter disease, most commonly indicating chronic microvascular ischemia. Electronically Signed   By: Ulyses Jarred M.D.   On: 08/13/2016 19:09   Dg Chest Port 1 View  Result Date: 08/13/2016 CLINICAL DATA:  Shortness of breath EXAM: PORTABLE CHEST 1 VIEW COMPARISON:  06/20/ 2014 FINDINGS: The heart size and mediastinal contours are within normal limits. Both lungs are clear. The visualized skeletal structures are unremarkable. IMPRESSION: No active disease. Electronically Signed   By: Donavan Foil M.D.   On: 08/13/2016 20:24    Lab Data:  CBC:  Recent Labs Lab 08/13/16 1345 08/14/16 0351  WBC 4.6 5.0  NEUTROABS 3.6  --   HGB 14.0 12.6*  HCT 42.0 38.5*  MCV 96.1 96.5  PLT 198 259   Basic Metabolic Panel:  Recent Labs Lab 08/13/16 1425 08/14/16 0351  NA 141 142  K 3.7 3.7  CL 116* 117*  CO2 19* 18*  GLUCOSE 152* 114*  BUN 24* 23*  CREATININE 2.23* 2.15*  CALCIUM 8.7* 8.4*   GFR: Estimated Creatinine Clearance: 28.5 mL/min (A) (by C-G formula based on SCr of 2.15 mg/dL (H)). Liver Function Tests: No results for input(s): AST, ALT, ALKPHOS, BILITOT, PROT, ALBUMIN in the last 168 hours. No results for input(s): LIPASE, AMYLASE in the last 168 hours. No results for input(s): AMMONIA in the last 168 hours. Coagulation Profile: No results for input(s): INR, PROTIME in the last 168 hours. Cardiac Enzymes:  Recent Labs Lab 08/13/16 1601  TROPONINI <0.03   BNP (last 3 results) No results for input(s): PROBNP in the last 8760 hours. HbA1C: No results for input(s): HGBA1C in the last 72 hours. CBG:  Recent Labs Lab 08/14/16 0156 08/14/16 0835  08/14/16 1217  GLUCAP 112* 105* 151*   Lipid Profile: No results for input(s): CHOL, HDL, LDLCALC, TRIG, CHOLHDL, LDLDIRECT in the last 72 hours. Thyroid Function Tests: No results for input(s): TSH, T4TOTAL, FREET4, T3FREE, THYROIDAB in the last 72 hours. Anemia Panel:  Recent Labs  08/14/16 0748  VITAMINB12 430   Urine analysis:    Component Value Date/Time   COLORURINE STRAW (A) 08/13/2016 1300  APPEARANCEUR CLEAR 08/13/2016 1300   LABSPEC 1.009 08/13/2016 1300   PHURINE 6.0 08/13/2016 1300   GLUCOSEU NEGATIVE 08/13/2016 1300   HGBUR NEGATIVE 08/13/2016 1300   BILIRUBINUR NEGATIVE 08/13/2016 1300   KETONESUR NEGATIVE 08/13/2016 1300   PROTEINUR 100 (A) 08/13/2016 1300   UROBILINOGEN 1.0 08/22/2012 1402   NITRITE NEGATIVE 08/13/2016 1300   LEUKOCYTESUR NEGATIVE 08/13/2016 1300     Samyia Motter M.D. [facility] 08/14/2016, 2:10 PM  Pager: [phone] Between 7am to 7pm - call Pager - [phone]  After 7pm go to [email or web address] - password [email or web address]  Call night coverage person covering after 7pm

## 2016-08-14 NOTE — Progress Notes (Addendum)
Pt had some SVTs, NP Schorr was notified. EKG on file. Will continue to monitor  Pt was bradycardic into the mid 30s,sustained in the 40s.BP is good,pt is resting well. NP Schorr was notified. Will coninue to monitor.

## 2016-08-14 NOTE — Evaluation (Deleted)
Physical Therapy Evaluation Patient Details Name: Elijah White MRN: [id number] DOB: May 21, 1940 Today's Date: 08/14/2016   History of Present Illness  Elijah White is a 76 y.o. male with history of hypertension, hyperlipidemia, diabetes mellitus, chronic kidney disease stage IV and glaucoma with poor peripheral vision presents to the ER with complaints of persistent dizziness.  Clinical Impression  Pt admitted with dizziness that was determined through answers to questions and objective exam to be likely due to BPPV.  Basic occulomotor exam negative for nystagmus with smooth pursuits and saccades and VOR negative.  Hallpike Dix negative R and positive for Posterior BPPV on L with torsional nystagmus.  Pt treated with Epply maneuver x2.  The 2nd Hallpike produced more violent nystagmus and sumptoms with less classic Epply s/s.  Supine head roll completed with ageotrophic nystagmus on L, highly suggestive that the crystals had fallen in the horizontal canal.  Pt underwent a BarBQue roll technique for Horizontal BPPV.  Will check pt 6/13.    Follow Up Recommendations Home health PT;Supervision/Assistance - 24 hour    Equipment Recommendations       Recommendations for Other Services       Precautions / Restrictions Precautions Precautions: Fall      Mobility  Bed Mobility Overal bed mobility: Needs Assistance Bed Mobility: Supine to Sit     Supine to sit: Min assist        Transfers Overall transfer level: Needs assistance   Transfers: Sit to/from Stand Sit to Stand: Min assist            Ambulation/Gait Ambulation/Gait assistance: Min guard Ambulation Distance (Feet): 140 Feet Assistive device: None (IV pole) Gait Pattern/deviations: Step-to pattern;Decreased step length - right;Decreased step length - left;Narrow base of support   Gait velocity interpretation: Below normal speed for age/gender    Stairs            Wheelchair Mobility    Modified  Rankin (Stroke Patients Only)       Balance Overall balance assessment: Needs assistance Sitting-balance support: Single extremity supported       Standing balance support: Single extremity supported                                 Pertinent Vitals/Pain Pain Assessment: No/denies pain    Home Living Family/patient expects to be discharged to:: Private residence Living Arrangements: Alone Available Help at Discharge: Family Type of Home: House Home Access: Level entry     Home Layout: One level Home Equipment: None      Prior Function Level of Independence: Independent               Hand Dominance        Extremity/Trunk Assessment   Upper Extremity Assessment Upper Extremity Assessment: Overall WFL for tasks assessed    Lower Extremity Assessment Lower Extremity Assessment: Overall WFL for tasks assessed       Communication   Communication: No difficulties  Cognition Arousal/Alertness: Awake/alert Behavior During Therapy: WFL for tasks assessed/performed Overall Cognitive Status: Within Functional Limits for tasks assessed                                        General Comments      Exercises     Assessment/Plan    PT Assessment Patient needs continued  PT services  PT Problem List Decreased activity tolerance;Decreased balance;Decreased mobility;Other (comment) (Dizziness)       PT Treatment Interventions DME instruction;Gait training;Functional mobility training;Therapeutic activities;Therapeutic exercise;Balance training;Neuromuscular re-education;Patient/family education (Vestibular )    PT Goals (Current goals can be found in the Care Plan section)  Acute Rehab PT Goals Patient Stated Goal: Patient would like to return home and complete ADLs independently.  PT Goal Formulation: With patient/family Time For Goal Achievement: 08/28/16 Potential to Achieve Goals: Good    Frequency Min 3X/week   Barriers  to discharge        Co-evaluation               AM-PAC PT "6 Clicks" Daily Activity  Outcome Measure Difficulty turning over in bed (including adjusting bedclothes, sheets and blankets)?: Total Difficulty moving from lying on back to sitting on the side of the bed? : Total Difficulty sitting down on and standing up from a chair with arms (e.g., wheelchair, bedside commode, etc,.)?: A Little Help needed moving to and from a bed to chair (including a wheelchair)?: A Little Help needed walking in hospital room?: A Little Help needed climbing 3-5 steps with a railing? : A Little 6 Click Score: 14    End of Session   Activity Tolerance: Patient limited by fatigue Patient left: in chair Nurse Communication: Other (comment) (Nurse to change bed sheets and clean patient's bathroom. ) PT Visit Diagnosis: BPPV;Other abnormalities of gait and mobility (R26.89) BPPV - Right/Left : Left    Time: 1445-1550 PT Time Calculation (min) (ACUTE ONLY): 65 min   Charges:   PT Evaluation $PT Eval Moderate Complexity: 1 Procedure PT Treatments $Gait Training: 8-22 mins $Neuromuscular Re-education: 8-22 mins $Canalith Rep Proc: 8-22 mins   PT G Codes:   PT G-Codes **NOT FOR INPATIENT CLASS** Functional Assessment Tool Used: AM-PAC 6 Clicks Basic Mobility;Clinical judgement Functional Limitation: Mobility: Walking and moving around;Changing and maintaining body position Mobility: Walking and Moving Around Current Status [phone]): At least 40 percent but less than 60 percent impaired, limited or restricted Mobility: Walking and Moving Around Goal Status [phone]): At least 20 percent but less than 40 percent impaired, limited or restricted Changing and Maintaining Body Position Current Status (O7121): At least 40 percent but less than 60 percent impaired, limited or restricted Changing and Maintaining Body Position Goal Status (F7588): At least 20 percent but less than 40 percent impaired, limited  or restricted    08/14/2016  Donnella Sham, PT [phone]  (pager)  Tessie Fass Vander Kueker 08/14/2016, 6:03 PM

## 2016-08-14 NOTE — Evaluation (Signed)
Physical Therapy Evaluation Patient Details Name: Elijah White MRN: [id number] DOB: 12/04/1940 Today's Date: 08/14/2016   History of Present Illness  Elijah White is a 76 y.o. male with history of hypertension, hyperlipidemia, diabetes mellitus, chronic kidney disease stage IV and glaucoma with poor peripheral vision presents to the ER with complaints of persistent dizziness.  Clinical Impression  The patient's present complaints of persistent dizziness are found to arise with sudden changes in body position, such as transferring supine-sit in the bed. Dix-Hallpike and supine roll tests (+) for L side BPPV. The patient will benefit from acute therapy to decrease dizziness and improve safe mobility in order to return home and return to independent PLOF.     Follow Up Recommendations Home health PT;Supervision/Assistance - 24 hour    Equipment Recommendations       Recommendations for Other Services       Precautions / Restrictions Precautions Precautions: Fall      Mobility  Bed Mobility Overal bed mobility: Needs Assistance Bed Mobility: Supine to Sit     Supine to sit: Min assist        Transfers Overall transfer level: Needs assistance   Transfers: Sit to/from Stand Sit to Stand: Min assist            Ambulation/Gait Ambulation/Gait assistance: Min guard Ambulation Distance (Feet): 140 Feet Assistive device: None (IV pole) Gait Pattern/deviations: Step-to pattern;Decreased step length - right;Decreased step length - left;Narrow base of support   Gait velocity interpretation: Below normal speed for age/gender    Stairs            Wheelchair Mobility    Modified Rankin (Stroke Patients Only)       Balance Overall balance assessment: Needs assistance Sitting-balance support: Single extremity supported       Standing balance support: Single extremity supported                                 Pertinent Vitals/Pain Pain  Assessment: No/denies pain    Home Living Family/patient expects to be discharged to:: Private residence Living Arrangements: Alone Available Help at Discharge: Family Type of Home: House Home Access: Level entry     Home Layout: One level Home Equipment: None      Prior Function Level of Independence: Independent               Hand Dominance        Extremity/Trunk Assessment   Upper Extremity Assessment Upper Extremity Assessment: Overall WFL for tasks assessed    Lower Extremity Assessment Lower Extremity Assessment: Overall WFL for tasks assessed       Communication   Communication: No difficulties  Cognition Arousal/Alertness: Awake/alert Behavior During Therapy: WFL for tasks assessed/performed Overall Cognitive Status: Within Functional Limits for tasks assessed                                        General Comments      Exercises     Assessment/Plan    PT Assessment Patient needs continued PT services  PT Problem List Decreased activity tolerance;Decreased balance;Decreased mobility;Other (comment) (Dizziness)       PT Treatment Interventions DME instruction;Gait training;Functional mobility training;Therapeutic activities;Therapeutic exercise;Balance training;Neuromuscular re-education;Patient/family education (Vestibular )    PT Goals (Current goals can be found in the Care Plan section)  Acute Rehab PT Goals Patient Stated Goal: Patient would like to return home and complete ADLs independently.  PT Goal Formulation: With patient/family Time For Goal Achievement: 08/28/16 Potential to Achieve Goals: Good    Frequency Min 3X/week   Barriers to discharge        Co-evaluation               AM-PAC PT "6 Clicks" Daily Activity  Outcome Measure Difficulty turning over in bed (including adjusting bedclothes, sheets and blankets)?: Total Difficulty moving from lying on back to sitting on the side of the bed? :  Total Difficulty sitting down on and standing up from a chair with arms (e.g., wheelchair, bedside commode, etc,.)?: A Little Help needed moving to and from a bed to chair (including a wheelchair)?: A Little Help needed walking in hospital room?: A Little Help needed climbing 3-5 steps with a railing? : A Little 6 Click Score: 14    End of Session   Activity Tolerance: Patient limited by fatigue Patient left: in chair Nurse Communication: Other (comment) (Nurse to change bed sheets and clean patient's bathroom. ) PT Visit Diagnosis: BPPV;Other abnormalities of gait and mobility (R26.89) BPPV - Right/Left : Left    Time: 1445-1550 PT Time Calculation (min) (ACUTE ONLY): 65 min   Charges:   PT Evaluation $PT Eval Moderate Complexity: 1 Procedure PT Treatments $Gait Training: 8-22 mins $Canalith Rep Proc: 23-37 mins   PT G Codes:   PT G-Codes **NOT FOR INPATIENT CLASS** Functional Assessment Tool Used: AM-PAC 6 Clicks Basic Mobility;Clinical judgement Functional Limitation: Mobility: Walking and moving around;Changing and maintaining body position Mobility: Walking and Moving Around Current Status [phone]): At least 40 percent but less than 60 percent impaired, limited or restricted Mobility: Walking and Moving Around Goal Status [phone]): At least 20 percent but less than 40 percent impaired, limited or restricted Changing and Maintaining Body Position Current Status (G9201): At least 40 percent but less than 60 percent impaired, limited or restricted Changing and Maintaining Body Position Goal Status (E0712): At least 20 percent but less than 40 percent impaired, limited or restricted      Elberta Leatherwood 08/14/2016, 4:26 PM

## 2016-08-14 NOTE — Care Management Obs Status (Signed)
[facility] NOTIFICATION   Patient Details  Name: Elijah White MRN: [id number] Date of Birth: [birth year]   Medicare Observation Status Notification Given:   (explained to pt and daughter Elijah White @ bs, pt declined to sign)    Sharin Mons, RN 08/14/2016, 2:52 PM

## 2016-08-14 NOTE — Progress Notes (Signed)
Pt arrived room in the presence of daughter. Cardiac telemetry was initiated. Pt was assessed to be AxOx4. No pain reported. Vital signs completed. Skin assessment was completed. Pt was oriented to room. Will continue to monitor pt.

## 2016-08-15 ENCOUNTER — Other Ambulatory Visit (HOSPITAL_COMMUNITY): Payer: Medicare Other

## 2016-08-15 ENCOUNTER — Observation Stay (HOSPITAL_BASED_OUTPATIENT_CLINIC_OR_DEPARTMENT_OTHER): Payer: Medicare Other

## 2016-08-15 DIAGNOSIS — I10 Essential (primary) hypertension: Secondary | ICD-10-CM | POA: Diagnosis not present

## 2016-08-15 DIAGNOSIS — R42 Dizziness and giddiness: Secondary | ICD-10-CM

## 2016-08-15 DIAGNOSIS — N179 Acute kidney failure, unspecified: Secondary | ICD-10-CM | POA: Diagnosis not present

## 2016-08-15 DIAGNOSIS — I5030 Unspecified diastolic (congestive) heart failure: Secondary | ICD-10-CM | POA: Diagnosis not present

## 2016-08-15 LAB — GLUCOSE, CAPILLARY
GLUCOSE-CAPILLARY: 139 mg/dL — AB (ref 65–99)
GLUCOSE-CAPILLARY: 112 mg/dL — AB (ref 65–99)
Glucose-Capillary: 88 mg/dL (ref 65–99)
Glucose-Capillary: 131 mg/dL — ABNORMAL HIGH (ref 65–99)

## 2016-08-15 LAB — CBC
HEMATOCRIT: 38.5 % — AB (ref 39.0–52.0)
HEMOGLOBIN: 12.5 g/dL — AB (ref 13.0–17.0)
MCH: 31.2 pg (ref 26.0–34.0)
MCHC: 32.5 g/dL (ref 30.0–36.0)
MCV: 96 fL (ref 78.0–100.0)
Platelets: 169 10*3/uL (ref 150–400)
RBC: 4.01 MIL/uL — ABNORMAL LOW (ref 4.22–5.81)
RDW: 13.5 % (ref 11.5–15.5)
WBC: 4.5 10*3/uL (ref 4.0–10.5)

## 2016-08-15 LAB — BASIC METABOLIC PANEL
Anion gap: 6 (ref 5–15)
BUN: 30 mg/dL — AB (ref 6–20)
CHLORIDE: 119 mmol/L — AB (ref 101–111)
CO2: 17 mmol/L — AB (ref 22–32)
CREATININE: 2.38 mg/dL — AB (ref 0.61–1.24)
Calcium: 7.8 mg/dL — ABNORMAL LOW (ref 8.9–10.3)
GFR calc Af Amer: 28 mL/min — ABNORMAL LOW (ref >60)
GFR calc non Af Amer: 25 mL/min — ABNORMAL LOW (ref >60)
Glucose, Bld: 116 mg/dL — ABNORMAL HIGH (ref 65–99)
Potassium: 3.7 mmol/L (ref 3.5–5.1)
Sodium: 142 mmol/L (ref 135–145)

## 2016-08-15 LAB — ECHOCARDIOGRAM COMPLETE
Height: 65 in
Weight: 3080 oz

## 2016-08-15 MED ORDER — AMLODIPINE BESYLATE 5 MG PO TABS
5.0000 mg | ORAL_TABLET | Freq: Every day | ORAL | Status: DC
  Administered 2016-08-15 – 2016-08-16 (×2): 5 mg via ORAL
  Filled 2016-08-15 (×2): qty 1

## 2016-08-15 NOTE — Progress Notes (Signed)
[facility]                                                                              Patient Demographics  Elijah White, is a 76 y.o. male, DOB - 1940/08/25, HUT:[id number]  Admit date - 08/13/2016   Admitting Physician Rise Patience, MD  Outpatient Primary MD for the patient is [name], MD  Outpatient specialists:   LOS - 0  days   Medical records reviewed and are as summarized below:    Chief Complaint  Patient presents with  . Dizziness       Brief summary   Patient is a 76 year old male with hypertension, hyperlipidemia, diabetes, CTD stage IV, blood, presented with persistent dizziness since the morning of admission.CT head, MRI of the brain was negative. No nystagmus.   Assessment & Plan    Principal Problem:   Dizziness:  BPPV - Patient reports a similar symptoms many years ago when he was diagnosed with vertigo. 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[place]), stage IV - stop IVF, baseline craetinine 2.2-2.5 per daughter, he is followed by Dr.Powell with [facility], last labs 1 week ago, creat 2.5    Essential hypertension -BP stable now, cut down amlodipine due to braycardia  Hyperlipidemia Continue statin  Glaucoma with poor peripheral vision - Continue eyedrops    Sinus bradycardia overnight -due to increased vagal tone -asymptomatic, cut down amlodipine in half to 5mg  daily  Code Status: full  DVT Prophylaxis:   heparin  Family Communication:called and d/w daughter Disposition Plan: Home later today vs in am  Time Spent in minutes   35  minutes  Procedures:  MRI brain  Consultants :   None   Antimicrobials  None    Medications  Scheduled Meds: . amLODipine  5 mg Oral Daily  . atorvastatin  20 mg Oral Daily  . brimonidine  1 drop Both Eyes Q8H  . dorzolamide-timolol  1 drop Both Eyes BID  . heparin  5,000 Units Subcutaneous Q8H  . hydrALAZINE  10 mg Oral Q8H  . insulin aspart  0-9 Units Subcutaneous TID WC  . meclizine  12.5 mg Oral TID   Continuous Infusions:  PRN Meds:.acetaminophen **OR** acetaminophen, ondansetron **OR** ondansetron (ZOFRAN) IV   Antibiotics   Anti-infectives    None        Subjective:   Elijah White was seen and examined today, dizziness improving, hasnt gotten up today, no chets pain or dyspnea  Objective:   Vitals:   08/14/16 2155 08/15/16 0246 08/15/16 0618 08/15/16 0749  BP: (!) 142/58 119/63 136/66   Pulse: (!) 58 (!) 49 (!) 44   Resp: 18 18 18    Temp: 98.6 F (37 C) 98.2 F (36.8 C) 97.8 F (36.6 C)   TempSrc: Oral Oral  Oral   SpO2: 100% 99% 100%   Weight:    87.3 kg (192 lb 8 oz)  Height:        Intake/Output Summary (Last 24 hours) at 08/15/16 1327 Last data filed at 08/15/16 3818  Gross per 24 hour  Intake           1697.5 ml  Output              350 ml  Net           1347.5 ml     Wt Readings from Last 3 Encounters:  08/15/16 87.3 kg (192 lb 8 oz)     Exam Awake Alert, Oriented X 3, No new F.N deficits, Normal affect HEENT: [place].AT,PERRAL Lungs: Good air movement bilaterally, CTAB CVS: RRR,No Gallops,Rubs or new Murmurs Abd: soft, No tenderness, BS present, No rebound  Ext: no edema   Data Reviewed:  I have personally reviewed following labs and imaging studies  Micro Results No results found for this or any previous visit (from the past 240 hour(s)).  Radiology Reports Ct Head Wo Contrast  Result Date: 08/13/2016 CLINICAL DATA:  Dizziness with hypertension EXAM: CT HEAD WITHOUT CONTRAST TECHNIQUE: Contiguous axial images were  obtained from the base of the skull through the vertex without intravenous contrast. COMPARISON:  March 03, 2008 FINDINGS: Brain: There is mild diffuse atrophy. There is no intracranial mass, hemorrhage, extra-axial fluid collection, or midline shift. There is small vessel disease throughout much of the centra semiovale bilaterally. There is evidence of small vessel disease with prior lacunar infarcts in the anterior limb of the left internal capsule. Mild small vessel disease is also noted in the right internal capsule anteriorly. There is no acute infarct evident. No new gray-white compartment lesions are evident. Vascular: There is no appreciable hyperdense vessel. There is calcification in each cavernous portion carotid artery. Skull: The bony calvarium appears intact. Sinuses/Orbits: There is mucosal thickening in several ethmoid air cells bilaterally, more on the left than on the right. Visualized paranasal sinuses elsewhere are clear. Orbits appear symmetric bilaterally except for apparent cataract removal on the right. Other: There is a large apparent sebaceous cyst in the soft tissues posterior to the occipital bone measuring 4.0 x 3.7 x 2.7 cm. There is increased attenuation within this apparent sebaceous cyst, likely representing hemorrhage or debris. Mastoid air cells are clear. IMPRESSION: Stable mild atrophy with supratentorial small vessel disease, essentially stable. No acute infarct evident. No intracranial mass, hemorrhage, or extra-axial fluid collection. Foci of calcification in each distal internal carotid artery. Foci of paranasal sinus disease in the ethmoid regions. Large posterior sebaceous cyst containing either debris from previous infection or possibly residual from previous hemorrhage. Electronically Signed   By: Lowella Grip III M.D.   On: 08/13/2016 15:12   Mr Brain Wo Contrast  Result Date: 08/13/2016 CLINICAL DATA:  New onset dizziness. EXAM: MRI HEAD WITHOUT CONTRAST  TECHNIQUE: Multiplanar, multiecho pulse sequences of the brain and surrounding structures were obtained without intravenous contrast. COMPARISON:  Head CT 08/13/2016 FINDINGS: Despite efforts by the technologist and patient, motion artifact is present on today's examination and could not be eliminated. The findings of this study are interpreted in the context of that limitation. Brain: The midline structures are normal. There is no focal diffusion restriction to indicate acute infarct. There is early confluent hyperintense T2-weighted signal within the periventricular white matter, most often seen in the setting of chronic microvascular ischemia. No intraparenchymal hematoma or chronic microhemorrhage.  Brain volume is normal for age without age-advanced or lobar predominant atrophy. The dura is normal and there is no extra-axial collection. Vascular: Major intracranial arterial and venous sinus flow voids are preserved. Skull and upper cervical spine: Large occipital soft tissue lesion, likely sebaceous cyst. Sinuses/Orbits: No fluid levels or advanced mucosal thickening. No mastoid effusion. Normal orbits. IMPRESSION: 1. No acute intracranial abnormality. 2. Bilateral periventricular white matter disease, most commonly indicating chronic microvascular ischemia. Electronically Signed   By: Ulyses Jarred M.D.   On: 08/13/2016 19:09   Dg Chest Port 1 View  Result Date: 08/13/2016 CLINICAL DATA:  Shortness of breath EXAM: PORTABLE CHEST 1 VIEW COMPARISON:  06/20/ 2014 FINDINGS: The heart size and mediastinal contours are within normal limits. Both lungs are clear. The visualized skeletal structures are unremarkable. IMPRESSION: No active disease. Electronically Signed   By: Donavan Foil M.D.   On: 08/13/2016 20:24    Lab Data:  CBC:  Recent Labs Lab 08/13/16 1345 08/14/16 0351 08/15/16 0514  WBC 4.6 5.0 4.5  NEUTROABS 3.6  --   --   HGB 14.0 12.6* 12.5*  HCT 42.0 38.5* 38.5*  MCV 96.1 96.5 96.0   PLT 198 162 102   Basic Metabolic Panel:  Recent Labs Lab 08/13/16 1425 08/14/16 0351 08/15/16 0514  NA 141 142 142  K 3.7 3.7 3.7  CL 116* 117* 119*  CO2 19* 18* 17*  GLUCOSE 152* 114* 116*  BUN 24* 23* 30*  CREATININE 2.23* 2.15* 2.38*  CALCIUM 8.7* 8.4* 7.8*   GFR: Estimated Creatinine Clearance: 26 mL/min (A) (by C-G formula based on SCr of 2.38 mg/dL (H)). Liver Function Tests: No results for input(s): AST, ALT, ALKPHOS, BILITOT, PROT, ALBUMIN in the last 168 hours. No results for input(s): LIPASE, AMYLASE in the last 168 hours. No results for input(s): AMMONIA in the last 168 hours. Coagulation Profile: No results for input(s): INR, PROTIME in the last 168 hours. Cardiac Enzymes:  Recent Labs Lab 08/13/16 1601  TROPONINI <0.03   BNP (last 3 results) No results for input(s): PROBNP in the last 8760 hours. HbA1C: No results for input(s): HGBA1C in the last 72 hours. CBG:  Recent Labs Lab 08/14/16 1217 08/14/16 1708 08/14/16 2124 08/15/16 0753 08/15/16 1208  GLUCAP 151* 126* 81 88 139*   Lipid Profile: No results for input(s): CHOL, HDL, LDLCALC, TRIG, CHOLHDL, LDLDIRECT in the last 72 hours. Thyroid Function Tests: No results for input(s): TSH, T4TOTAL, FREET4, T3FREE, THYROIDAB in the last 72 hours. Anemia Panel:  Recent Labs  08/14/16 0748  VITAMINB12 430   Urine analysis:    Component Value Date/Time   COLORURINE STRAW (A) 08/13/2016 1300   APPEARANCEUR CLEAR 08/13/2016 1300   LABSPEC 1.009 08/13/2016 1300   PHURINE 6.0 08/13/2016 1300   GLUCOSEU NEGATIVE 08/13/2016 1300   HGBUR NEGATIVE 08/13/2016 1300   BILIRUBINUR NEGATIVE 08/13/2016 1300   KETONESUR NEGATIVE 08/13/2016 1300   PROTEINUR 100 (A) 08/13/2016 1300   UROBILINOGEN 1.0 08/22/2012 1402   NITRITE NEGATIVE 08/13/2016 1300   LEUKOCYTESUR NEGATIVE 08/13/2016 [place] M.D. [facility] 08/15/2016, 1:27 PM  Pager: go to [email or web address] - password  [email or web address]  Call night coverage person covering after 7pm

## 2016-08-15 NOTE — Progress Notes (Signed)
Physical Therapy Treatment Patient Details Name: Elijah White MRN: [id number] DOB: 12/22/1940 Today's Date: 08/15/2016    History of Present Illness Elijah White is a 76 y.o. male with history of hypertension, hyperlipidemia, diabetes mellitus, chronic kidney disease stage IV and glaucoma with poor peripheral vision presents to the ER with complaints of persistent dizziness.    PT Comments    Extensive Vestibular assessment and treatment for R posterior and R horizontal canal BPPV.   Follow Up Recommendations  Outpatient PT     Equipment Recommendations  None recommended by PT    Recommendations for Other Services       Precautions / Restrictions Precautions Precautions: Fall    Mobility  Bed Mobility Overal bed mobility: Needs Assistance Bed Mobility: Supine to Sit     Supine to sit: Min assist     General bed mobility comments: pt able to struggle up if given time.  Much less struggle with minimal support  Transfers Overall transfer level: Needs assistance   Transfers: Sit to/from Stand Sit to Stand: Min guard            Ambulation/Gait Ambulation/Gait assistance: Min guard;Min assist Ambulation Distance (Feet): 150 Feet Assistive device: None;1 person hand held assist (rail) Gait Pattern/deviations: Step-through pattern   Gait velocity interpretation: Below normal speed for age/gender General Gait Details: Initially mildly unsteady and guarded improving with distance and confidence.  Initially assisted for stability then no assist and pt using rail/wall.   Stairs            Wheelchair Mobility    Modified Rankin (Stroke Patients Only)       Balance Overall balance assessment: Needs assistance Sitting-balance support: No upper extremity supported Sitting balance-Leahy Scale: Fair     Standing balance support: Single extremity supported;No upper extremity supported Standing balance-Leahy Scale: Fair                               Cognition Arousal/Alertness: Awake/alert Behavior During Therapy: WFL for tasks assessed/performed Overall Cognitive Status: Within Functional Limits for tasks assessed                                        Exercises      General Comments General comments (skin integrity, edema, etc.): Continued vestibular assessment for posterior and horizontal BPPV.  Symptoms and nystagmus showed for both R post and R horizontal  with extended period of nystagmus.  Surmising that he had a crystal stuck b/w post/horiz.  we completed ?Cassiani maneuver to try and dislodge the crystal.  We then proceeded to do both an [place] and [place] roll for R posterior and R horizontal canal BPPV, respectively.  We will need to recheck 6/14.      Pertinent Vitals/Pain Pain Assessment: No/denies pain    Home Living                      Prior Function            PT Goals (current goals can now be found in the care plan section) Acute Rehab PT Goals Patient Stated Goal: Patient would like to return home and complete ADLs independently.  PT Goal Formulation: With patient/family Time For Goal Achievement: 08/28/16 Potential to Achieve Goals: Good Progress towards PT goals: Progressing toward goals    Frequency  Min 3X/week      PT Plan Current plan remains appropriate    Co-evaluation              AM-PAC PT "6 Clicks" Daily Activity  Outcome Measure  Difficulty turning over in bed (including adjusting bedclothes, sheets and blankets)?: A Little Difficulty moving from lying on back to sitting on the side of the bed? : A Little Difficulty sitting down on and standing up from a chair with arms (e.g., wheelchair, bedside commode, etc,.)?: A Little Help needed moving to and from a bed to chair (including a wheelchair)?: A Little Help needed walking in hospital room?: A Little Help needed climbing 3-5 steps with a railing? : A Little 6 Click Score: 18     End of Session   Activity Tolerance: Patient tolerated treatment well Patient left: in bed;with bed alarm set;with call bell/phone within reach;Other (comment) (sitting upright)   PT Visit Diagnosis: BPPV;Other abnormalities of gait and mobility (R26.89) BPPV - Right/Left : Right     Time: 1400-1503 PT Time Calculation (min) (ACUTE ONLY): 63 min  Charges:  $Gait Training: 8-22 mins $Therapeutic Activity: 8-22 mins $Neuromuscular Re-education: 8-22 mins $Canalith Rep Proc: 8-22 mins                    G Codes:       August 18, 2016  Donnella Sham, PT [phone]  (pager)   Tessie Fass Ilaisaane Marts 08/18/2016, 3:28 PM

## 2016-08-15 NOTE — Progress Notes (Signed)
  Echocardiogram 2D Echocardiogram has been performed.  Elijah White Elijah White 08/15/2016, 4:10 PM

## 2016-08-16 DIAGNOSIS — R42 Dizziness and giddiness: Secondary | ICD-10-CM | POA: Diagnosis not present

## 2016-08-16 DIAGNOSIS — R269 Unspecified abnormalities of gait and mobility: Secondary | ICD-10-CM | POA: Diagnosis not present

## 2016-08-16 LAB — BASIC METABOLIC PANEL
Anion gap: 7 (ref 5–15)
BUN: 32 mg/dL — AB (ref 6–20)
CALCIUM: 8.4 mg/dL — AB (ref 8.9–10.3)
CO2: 19 mmol/L — ABNORMAL LOW (ref 22–32)
CREATININE: 2.52 mg/dL — AB (ref 0.61–1.24)
Chloride: 115 mmol/L — ABNORMAL HIGH (ref 101–111)
GFR calc Af Amer: 27 mL/min — ABNORMAL LOW (ref >60)
GFR, EST NON AFRICAN AMERICAN: 23 mL/min — AB (ref >60)
GLUCOSE: 154 mg/dL — AB (ref 65–99)
Potassium: 3.6 mmol/L (ref 3.5–5.1)
SODIUM: 141 mmol/L (ref 135–145)

## 2016-08-16 LAB — HEMOGLOBIN A1C
HEMOGLOBIN A1C: 6.6 % — AB (ref 4.8–5.6)
Mean Plasma Glucose: 143 mg/dL

## 2016-08-16 LAB — CBC
HCT: 40.6 % (ref 39.0–52.0)
Hemoglobin: 13.1 g/dL (ref 13.0–17.0)
MCH: 31.1 pg (ref 26.0–34.0)
MCHC: 32.3 g/dL (ref 30.0–36.0)
MCV: 96.4 fL (ref 78.0–100.0)
PLATELETS: 182 10*3/uL (ref 150–400)
RBC: 4.21 MIL/uL — AB (ref 4.22–5.81)
RDW: 13.5 % (ref 11.5–15.5)
WBC: 4.8 10*3/uL (ref 4.0–10.5)

## 2016-08-16 LAB — GLUCOSE, CAPILLARY
GLUCOSE-CAPILLARY: 103 mg/dL — AB (ref 65–99)
Glucose-Capillary: 152 mg/dL — ABNORMAL HIGH (ref 65–99)

## 2016-08-16 MED ORDER — AMLODIPINE BESYLATE 10 MG PO TABS
5.0000 mg | ORAL_TABLET | Freq: Every day | ORAL | Status: DC
Start: 2016-08-16 — End: 2018-02-20

## 2016-08-16 NOTE — Care Management Note (Signed)
Case Management Note  Patient Details  Name: RAMIN ZOLL MRN: [id number] Date of Birth: January 21, 1941  Subjective/Objective:   Dizziness, vertigo                 Action/Plan: Discharge Planning: NCM spoke to pt and states his dtr assist him at home. Contacted [facility] DME rep for RW for home. Neurorehab referral sent for vestibular outpt PT. Provided pt with brochure to call and arrange appt.   PCP [name] MD  Expected Discharge Date:  08/16/16               Expected Discharge Plan:  Home/Self Care  In-House Referral:  NA  Discharge planning Services  CM Consult  Post Acute Care Choice:  NA Choice offered to:  NA  DME Arranged:  [place] rolling DME Agency:  [place]:  NA [place] Agency:  NA  Status of Service:  Completed, signed off  If discussed at [place] of Stay Meetings, dates discussed:    Additional Comments:  Erenest Rasher, RN 08/16/2016, 12:02 PM

## 2016-08-16 NOTE — Care Management Obs Status (Addendum)
MEDICARE OBSERVATION STATUS NOTIFICATION  Notice given on 08/14/2016   Patient Details  Name: Elijah White MRN: [id number] Date of Birth: Sep 25, 1940   Medicare Observation Status Notification Given:   (explained to pt and daughter [name] @ bs, pt declined to sign)    Erenest Rasher, RN 08/16/2016, 12:00 PM

## 2016-08-16 NOTE — Progress Notes (Signed)
Patient heart rate  was 42 to 44 informed on call DR  K. schorr receive no response will continue monitor.

## 2016-08-16 NOTE — Progress Notes (Signed)
Elijah White to be D/C'd Home per MD order.  Discussed with the patient and all questions fully answered.  VSS, Skin clean, dry and intact without evidence of skin break down, no evidence of skin tears noted. IV catheter discontinued intact. Site without signs and symptoms of complications. Dressing and pressure applied.  An After Visit Summary was printed and given to the patient. Patient received prescription.  D/c education completed with patient/family including follow up instructions, medication list, d/c activities limitations if indicated, with other d/c instructions as indicated by MD - patient able to verbalize understanding, all questions fully answered.   Patient instructed to return to ED, call 911, or call MD for any changes in condition.   Patient escorted via Elijah White, and D/C home via private auto.  [name] 08/16/2016 4:11 PM

## 2016-08-20 NOTE — Discharge Summary (Signed)
Physician Discharge Summary  Elijah White XNA:[id number] DOB: Dec 25, 1940 DOA: 08/13/2016  PCP: [name], MD  Admit date: 08/13/2016 Discharge date: 08/16/2016  Time spent: 35 minutes  Recommendations for Outpatient Follow-up:  Outpatient PT  Discharge Diagnoses:  Principal Problem:   Dizziness   BPPV   Type 2 diabetes mellitus with renal manifestations [facility])   CKD 3   Essential hypertension   Discharge Condition: stable  Diet recommendation: DM  Filed Weights   08/14/16 0527 08/15/16 0749 08/16/16 0547  Weight: 85.8 kg (189 lb 1.6 oz) 87.3 kg (192 lb 8 oz) 85.4 kg (188 lb 3.2 oz)    History of present illness:  Patient is a 76 year old male with hypertension, hyperlipidemia, diabetes, CTD stage IV, blood, presented with persistent dizziness since the morning of admission  Hospital Course:  Dizziness:  BPPV - Patient reports a similar symptoms many years ago when he was diagnosed with vertigo. - MRI of the brain negative for acute CVA - Orthostatics negative we still held diuretics initially and hydrated him - PT consult/vestibular evaluation greatly appreciated - much improved with vestibular PT, Outpatient PT recommended -advised pt and daughter that short term supervision was recommended    Type 2 diabetes mellitus with renal manifestations ([place]) - stable, resumed glipizide    Renal failure (ARF), CKD3 - stopped IVF, baseline craetinine 2.2-2.5 per daughter, he is followed by Dr.Powell with [facility], last labs 1 week ago, creat 2.5 -stable    Essential hypertension -BP stable now, cut down amlodipine due to braycardia  Hyperlipidemia Continue statin  Glaucoma with poor peripheral vision - Continue eyedrops    Sinus bradycardia overnight -due to increased vagal tone -asymptomatic, cut down amlodipine in half to 5mg  daily   Consultations:  PT  Discharge Exam: Vitals:   08/16/16 1253 08/16/16 1505  BP: (!) 154/69 (!) 144/75  Pulse:  64   Resp:  18  Temp:  98.4 F (36.9 C)    General: AAOx3 Cardiovascular: S1S2/RRR Respiratory: CTAB  Discharge Instructions   Discharge Instructions    Diet - low sodium heart healthy    Complete by:  As directed    Increase activity slowly    Complete by:  As directed      Discharge Medication List as of 08/16/2016 12:40 PM    CONTINUE these medications which have CHANGED   Details  amLODipine (NORVASC) 10 MG tablet Take 0.5 tablets (5 mg total) by mouth daily., Starting Thu 08/16/2016, No Print      CONTINUE these medications which have NOT CHANGED   Details  acetaminophen (TYLENOL) 325 MG tablet Take 325 mg by mouth every 6 (six) hours as needed for mild pain or headache., Historical Med    atorvastatin (LIPITOR) 10 MG tablet Take 2 tablets (20 mg total) by mouth daily., Starting Fri 08/22/2012, Print    furosemide (LASIX) 40 MG tablet Take 40 mg by mouth 2 (two) times a week., Starting Wed 07/04/2016, Historical Med    glipiZIDE (GLUCOTROL) 5 MG tablet Take 5 mg by mouth 2 (two) times daily before a meal., Starting Wed 01/11/2016, Historical Med    LUMIGAN 0.01 % SOLN Place 1 drop into both eyes at bedtime., Starting Mon 05/28/2016, Historical Med    NEXIUM 40 MG capsule Take 40 mg by mouth daily., Starting Fri 07/27/2016, Historical Med    pilocarpine (PILOCAR) 1 % ophthalmic solution Place 1 drop into both eyes 3 (three) times daily., Starting Mon 06/25/2016, Historical Med    timolol (TIMOPTIC)  0.5 % ophthalmic solution Place 1 drop into both eyes 2 (two) times daily., Starting Mon 05/28/2016, Historical Med      STOP taking these medications     acetaZOLAMIDE (DIAMOX) 250 MG tablet      ALPHAGAN P 0.1 % SOLN      dorzolamide-timolol (COSOPT) 22.3-6.8 MG/ML ophthalmic solution        No Known Allergies Follow-up Information    [name], MD. Schedule an appointment as soon as possible for a visit in 1 week.   Specialty:  Internal Medicine Why:  C    alled  and left message for them to call back with an appointment. Contact information: [address] [place] [address] [phone]        Estanislado Emms, MD In 1 month.   Specialty:  Nephrology Contact information: [place] [address] [phone]        [place].   Specialty:  Rehabilitation Why:  please call to arrange appointment Contact information: [address] [place] [id number] [place] [phone]           The results of significant diagnostics from this hospitalization (including imaging, microbiology, ancillary and laboratory) are listed below for reference.    Significant Diagnostic Studies: Ct Head Wo Contrast  Result Date: 08/13/2016 CLINICAL DATA:  Dizziness with hypertension EXAM: CT HEAD WITHOUT CONTRAST TECHNIQUE: Contiguous axial images were obtained from the base of the skull through the vertex without intravenous contrast. COMPARISON:  March 03, 2008 FINDINGS: Brain: There is mild diffuse atrophy. There is no intracranial mass, hemorrhage, extra-axial fluid collection, or midline shift. There is small vessel disease throughout much of the centra semiovale bilaterally. There is evidence of small vessel disease with prior lacunar infarcts in the anterior limb of the left internal capsule. Mild small vessel disease is also noted in the right internal capsule anteriorly. There is no acute infarct evident. No new gray-white compartment lesions are evident. Vascular: There is no appreciable hyperdense vessel. There is calcification in each cavernous portion carotid artery. Skull: The bony calvarium appears intact. Sinuses/Orbits: There is mucosal thickening in several ethmoid air cells bilaterally, more on the left than on the right. Visualized paranasal sinuses elsewhere are clear. Orbits appear symmetric bilaterally except for  apparent cataract removal on the right. Other: There is a large apparent sebaceous cyst in the soft tissues posterior to the occipital bone measuring 4.0 x 3.7 x 2.7 cm. There is increased attenuation within this apparent sebaceous cyst, likely representing hemorrhage or debris. Mastoid air cells are clear. IMPRESSION: Stable mild atrophy with supratentorial small vessel disease, essentially stable. No acute infarct evident. No intracranial mass, hemorrhage, or extra-axial fluid collection. Foci of calcification in each distal internal carotid artery. Foci of paranasal sinus disease in the ethmoid regions. Large posterior sebaceous cyst containing either debris from previous infection or possibly residual from previous hemorrhage. Electronically Signed   By: Lowella Grip III M.D.   On: 08/13/2016 15:12   Mr Brain Wo Contrast  Result Date: 08/13/2016 CLINICAL DATA:  New onset dizziness. EXAM: MRI HEAD WITHOUT CONTRAST TECHNIQUE: Multiplanar, multiecho pulse sequences of the brain and surrounding structures were obtained without intravenous contrast. COMPARISON:  Head CT 08/13/2016 FINDINGS: Despite efforts by the technologist and patient, motion artifact is present on today's examination  and could not be eliminated. The findings of this study are interpreted in the context of that limitation. Brain: The midline structures are normal. There is no focal diffusion restriction to indicate acute infarct. There is early confluent hyperintense T2-weighted signal within the periventricular white matter, most often seen in the setting of chronic microvascular ischemia. No intraparenchymal hematoma or chronic microhemorrhage. Brain volume is normal for age without age-advanced or lobar predominant atrophy. The dura is normal and there is no extra-axial collection. Vascular: Major intracranial arterial and venous sinus flow voids are preserved. Skull and upper cervical spine: Large occipital soft tissue lesion, likely  sebaceous cyst. Sinuses/Orbits: No fluid levels or advanced mucosal thickening. No mastoid effusion. Normal orbits. IMPRESSION: 1. No acute intracranial abnormality. 2. Bilateral periventricular white matter disease, most commonly indicating chronic microvascular ischemia. Electronically Signed   By: Ulyses Jarred M.D.   On: 08/13/2016 19:09   Dg Chest Port 1 View  Result Date: 08/13/2016 CLINICAL DATA:  Shortness of breath EXAM: PORTABLE CHEST 1 VIEW COMPARISON:  06/20/ 2014 FINDINGS: The heart size and mediastinal contours are within normal limits. Both lungs are clear. The visualized skeletal structures are unremarkable. IMPRESSION: No active disease. Electronically Signed   By: Donavan Foil M.D.   On: 08/13/2016 20:24    Microbiology: No results found for this or any previous visit (from the past 240 hour(s)).   Labs: Basic Metabolic Panel:  Recent Labs Lab 08/13/16 1425 08/14/16 0351 08/15/16 0514 08/16/16 0428  NA 141 142 142 141  K 3.7 3.7 3.7 3.6  CL 116* 117* 119* 115*  CO2 19* 18* 17* 19*  GLUCOSE 152* 114* 116* 154*  BUN 24* 23* 30* 32*  CREATININE 2.23* 2.15* 2.38* 2.52*  CALCIUM 8.7* 8.4* 7.8* 8.4*   Liver Function Tests: No results for input(s): AST, ALT, ALKPHOS, BILITOT, PROT, ALBUMIN in the last 168 hours. No results for input(s): LIPASE, AMYLASE in the last 168 hours. No results for input(s): AMMONIA in the last 168 hours. CBC:  Recent Labs Lab 08/13/16 1345 08/14/16 0351 08/15/16 0514 08/16/16 0428  WBC 4.6 5.0 4.5 4.8  NEUTROABS 3.6  --   --   --   HGB 14.0 12.6* 12.5* 13.1  HCT 42.0 38.5* 38.5* 40.6  MCV 96.1 96.5 96.0 96.4  PLT 198 162 169 182   Cardiac Enzymes:  Recent Labs Lab 08/13/16 1601  TROPONINI <0.03   BNP: BNP (last 3 results) No results for input(s): BNP in the last 8760 hours.  ProBNP (last 3 results) No results for input(s): PROBNP in the last 8760 hours.  CBG:  Recent Labs Lab 08/15/16 1208 08/15/16 1637  08/15/16 2055 08/16/16 0801 08/16/16 1204  GLUCAP 139* 112* 131* 103* 152*       Signed:  Justise Ehmann MD.  [facility] 08/20/2016, 11:50 AM

## 2016-08-23 ENCOUNTER — Ambulatory Visit: Payer: Medicare Other | Admitting: Rehabilitative and Restorative Service Providers"

## 2016-09-03 DIAGNOSIS — N183 Chronic kidney disease, stage 3 (moderate): Secondary | ICD-10-CM | POA: Diagnosis not present

## 2016-09-03 DIAGNOSIS — E1122 Type 2 diabetes mellitus with diabetic chronic kidney disease: Secondary | ICD-10-CM | POA: Diagnosis not present

## 2016-09-03 DIAGNOSIS — E782 Mixed hyperlipidemia: Secondary | ICD-10-CM | POA: Diagnosis not present

## 2016-09-03 DIAGNOSIS — R42 Dizziness and giddiness: Secondary | ICD-10-CM | POA: Diagnosis not present

## 2016-09-03 DIAGNOSIS — I129 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 kidney disease, or unspecified chronic kidney disease: Secondary | ICD-10-CM | POA: Diagnosis not present

## 2016-09-10 DIAGNOSIS — E785 Hyperlipidemia, unspecified: Secondary | ICD-10-CM | POA: Insufficient documentation

## 2016-09-10 DIAGNOSIS — K219 Gastro-esophageal reflux disease without esophagitis: Secondary | ICD-10-CM | POA: Insufficient documentation

## 2016-09-10 DIAGNOSIS — H2512 Age-related nuclear cataract, left eye: Secondary | ICD-10-CM | POA: Diagnosis not present

## 2016-09-10 DIAGNOSIS — H401123 Primary open-angle glaucoma, left eye, severe stage: Secondary | ICD-10-CM | POA: Diagnosis not present

## 2016-11-13 DIAGNOSIS — K219 Gastro-esophageal reflux disease without esophagitis: Secondary | ICD-10-CM | POA: Diagnosis not present

## 2016-11-13 DIAGNOSIS — H5703 Miosis: Secondary | ICD-10-CM | POA: Diagnosis not present

## 2016-11-13 DIAGNOSIS — Z7984 Long term (current) use of oral hypoglycemic drugs: Secondary | ICD-10-CM | POA: Diagnosis not present

## 2016-11-13 DIAGNOSIS — H401123 Primary open-angle glaucoma, left eye, severe stage: Secondary | ICD-10-CM | POA: Diagnosis not present

## 2016-11-13 DIAGNOSIS — E785 Hyperlipidemia, unspecified: Secondary | ICD-10-CM | POA: Diagnosis not present

## 2016-11-13 DIAGNOSIS — E119 Type 2 diabetes mellitus without complications: Secondary | ICD-10-CM | POA: Diagnosis not present

## 2016-11-13 DIAGNOSIS — I10 Essential (primary) hypertension: Secondary | ICD-10-CM | POA: Diagnosis not present

## 2016-11-13 DIAGNOSIS — H2512 Age-related nuclear cataract, left eye: Secondary | ICD-10-CM | POA: Diagnosis not present

## 2016-11-14 DIAGNOSIS — Z9889 Other specified postprocedural states: Secondary | ICD-10-CM | POA: Diagnosis not present

## 2016-11-14 DIAGNOSIS — H401133 Primary open-angle glaucoma, bilateral, severe stage: Secondary | ICD-10-CM | POA: Diagnosis not present

## 2017-01-07 DIAGNOSIS — N183 Chronic kidney disease, stage 3 (moderate): Secondary | ICD-10-CM | POA: Diagnosis not present

## 2017-01-07 DIAGNOSIS — C61 Malignant neoplasm of prostate: Secondary | ICD-10-CM | POA: Diagnosis not present

## 2017-01-07 DIAGNOSIS — I10 Essential (primary) hypertension: Secondary | ICD-10-CM | POA: Diagnosis not present

## 2017-01-07 DIAGNOSIS — N184 Chronic kidney disease, stage 4 (severe): Secondary | ICD-10-CM | POA: Diagnosis not present

## 2017-01-07 DIAGNOSIS — N139 Obstructive and reflux uropathy, unspecified: Secondary | ICD-10-CM | POA: Diagnosis not present

## 2017-02-18 DIAGNOSIS — H401123 Primary open-angle glaucoma, left eye, severe stage: Secondary | ICD-10-CM | POA: Diagnosis not present

## 2017-02-18 DIAGNOSIS — H401133 Primary open-angle glaucoma, bilateral, severe stage: Secondary | ICD-10-CM | POA: Diagnosis not present

## 2017-02-22 DIAGNOSIS — E1122 Type 2 diabetes mellitus with diabetic chronic kidney disease: Secondary | ICD-10-CM | POA: Diagnosis not present

## 2017-02-22 DIAGNOSIS — E782 Mixed hyperlipidemia: Secondary | ICD-10-CM | POA: Diagnosis not present

## 2017-02-22 DIAGNOSIS — I129 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 kidney disease, or unspecified chronic kidney disease: Secondary | ICD-10-CM | POA: Diagnosis not present

## 2017-02-22 DIAGNOSIS — Z23 Encounter for immunization: Secondary | ICD-10-CM | POA: Diagnosis not present

## 2017-02-22 DIAGNOSIS — E1165 Type 2 diabetes mellitus with hyperglycemia: Secondary | ICD-10-CM | POA: Diagnosis not present

## 2017-02-22 DIAGNOSIS — H5203 Hypermetropia, bilateral: Secondary | ICD-10-CM | POA: Diagnosis not present

## 2017-02-22 DIAGNOSIS — H4089 Other specified glaucoma: Secondary | ICD-10-CM | POA: Diagnosis not present

## 2017-03-05 HISTORY — PX: GLAUCOMA SURGERY: SHX656

## 2017-04-05 ENCOUNTER — Other Ambulatory Visit: Payer: Self-pay

## 2017-04-05 NOTE — Patient Outreach (Signed)
[facility]) Care Management  04/05/2017  PRABHJOT PISCITELLO 1940-04-17 [id number]   Medication Adherence call to Mr. Verbon Giangregorio patient is showing he is past due under [facility] Ins.on  two of his medications Atorvastatin 10 mg and Glipizide 5 mg spoke with patient he said he is in need of Glipizide and will like for me to call his doctor(Robyn Bobetta Lime) to get more refills,I call doctors office and left a message for doctor to send in refill for Glipizide and Atorvastatin I ask for doctor's office to call me back to assure they received the call because patient complain that he have let several message and has not heard back from the doctor's office.   [place] Management Direct Dial [phone]  Fax [phone] [email or web address]

## 2017-04-08 DIAGNOSIS — H401133 Primary open-angle glaucoma, bilateral, severe stage: Secondary | ICD-10-CM | POA: Diagnosis not present

## 2017-04-08 DIAGNOSIS — H401123 Primary open-angle glaucoma, left eye, severe stage: Secondary | ICD-10-CM | POA: Diagnosis not present

## 2017-04-19 DIAGNOSIS — N184 Chronic kidney disease, stage 4 (severe): Secondary | ICD-10-CM | POA: Diagnosis not present

## 2017-04-19 DIAGNOSIS — I129 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 kidney disease, or unspecified chronic kidney disease: Secondary | ICD-10-CM | POA: Diagnosis not present

## 2017-04-19 DIAGNOSIS — C61 Malignant neoplasm of prostate: Secondary | ICD-10-CM | POA: Diagnosis not present

## 2017-04-19 DIAGNOSIS — N139 Obstructive and reflux uropathy, unspecified: Secondary | ICD-10-CM | POA: Diagnosis not present

## 2017-04-30 DIAGNOSIS — I10 Essential (primary) hypertension: Secondary | ICD-10-CM | POA: Diagnosis not present

## 2017-04-30 DIAGNOSIS — K219 Gastro-esophageal reflux disease without esophagitis: Secondary | ICD-10-CM | POA: Diagnosis not present

## 2017-04-30 DIAGNOSIS — Z7984 Long term (current) use of oral hypoglycemic drugs: Secondary | ICD-10-CM | POA: Diagnosis not present

## 2017-04-30 DIAGNOSIS — H401123 Primary open-angle glaucoma, left eye, severe stage: Secondary | ICD-10-CM | POA: Diagnosis not present

## 2017-04-30 DIAGNOSIS — Z9841 Cataract extraction status, right eye: Secondary | ICD-10-CM | POA: Diagnosis not present

## 2017-04-30 DIAGNOSIS — Z9889 Other specified postprocedural states: Secondary | ICD-10-CM | POA: Diagnosis not present

## 2017-04-30 DIAGNOSIS — E119 Type 2 diabetes mellitus without complications: Secondary | ICD-10-CM | POA: Diagnosis not present

## 2017-04-30 DIAGNOSIS — Z961 Presence of intraocular lens: Secondary | ICD-10-CM | POA: Diagnosis not present

## 2017-04-30 DIAGNOSIS — Z9842 Cataract extraction status, left eye: Secondary | ICD-10-CM | POA: Diagnosis not present

## 2017-04-30 DIAGNOSIS — Z79899 Other long term (current) drug therapy: Secondary | ICD-10-CM | POA: Diagnosis not present

## 2017-06-25 DIAGNOSIS — E1139 Type 2 diabetes mellitus with other diabetic ophthalmic complication: Secondary | ICD-10-CM | POA: Diagnosis not present

## 2017-06-25 DIAGNOSIS — K219 Gastro-esophageal reflux disease without esophagitis: Secondary | ICD-10-CM | POA: Diagnosis not present

## 2017-06-25 DIAGNOSIS — I129 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 kidney disease, or unspecified chronic kidney disease: Secondary | ICD-10-CM | POA: Diagnosis not present

## 2017-06-25 DIAGNOSIS — Z7984 Long term (current) use of oral hypoglycemic drugs: Secondary | ICD-10-CM | POA: Diagnosis not present

## 2017-06-25 DIAGNOSIS — E1122 Type 2 diabetes mellitus with diabetic chronic kidney disease: Secondary | ICD-10-CM | POA: Diagnosis not present

## 2017-06-25 DIAGNOSIS — H401123 Primary open-angle glaucoma, left eye, severe stage: Secondary | ICD-10-CM | POA: Diagnosis not present

## 2017-06-25 DIAGNOSIS — I10 Essential (primary) hypertension: Secondary | ICD-10-CM | POA: Diagnosis not present

## 2017-06-25 DIAGNOSIS — H42 Glaucoma in diseases classified elsewhere: Secondary | ICD-10-CM | POA: Diagnosis not present

## 2017-06-25 DIAGNOSIS — Z79899 Other long term (current) drug therapy: Secondary | ICD-10-CM | POA: Diagnosis not present

## 2017-06-25 DIAGNOSIS — N184 Chronic kidney disease, stage 4 (severe): Secondary | ICD-10-CM | POA: Diagnosis not present

## 2017-07-02 DIAGNOSIS — N08 Glomerular disorders in diseases classified elsewhere: Secondary | ICD-10-CM | POA: Diagnosis not present

## 2017-07-02 DIAGNOSIS — I129 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 kidney disease, or unspecified chronic kidney disease: Secondary | ICD-10-CM | POA: Diagnosis not present

## 2017-07-02 DIAGNOSIS — E1122 Type 2 diabetes mellitus with diabetic chronic kidney disease: Secondary | ICD-10-CM | POA: Diagnosis not present

## 2017-07-02 DIAGNOSIS — N183 Chronic kidney disease, stage 3 (moderate): Secondary | ICD-10-CM | POA: Diagnosis not present

## 2017-07-02 DIAGNOSIS — R609 Edema, unspecified: Secondary | ICD-10-CM | POA: Diagnosis not present

## 2017-07-10 DIAGNOSIS — H02831 Dermatochalasis of right upper eyelid: Secondary | ICD-10-CM | POA: Diagnosis not present

## 2017-07-10 DIAGNOSIS — H31302 Unspecified choroidal hemorrhage, left eye: Secondary | ICD-10-CM | POA: Diagnosis not present

## 2017-07-10 DIAGNOSIS — H401133 Primary open-angle glaucoma, bilateral, severe stage: Secondary | ICD-10-CM | POA: Diagnosis not present

## 2017-07-10 DIAGNOSIS — Z961 Presence of intraocular lens: Secondary | ICD-10-CM | POA: Diagnosis not present

## 2017-07-10 DIAGNOSIS — Z9842 Cataract extraction status, left eye: Secondary | ICD-10-CM | POA: Diagnosis not present

## 2017-07-11 DIAGNOSIS — H31402 Unspecified choroidal detachment, left eye: Secondary | ICD-10-CM | POA: Insufficient documentation

## 2017-07-11 DIAGNOSIS — Z961 Presence of intraocular lens: Secondary | ICD-10-CM | POA: Diagnosis not present

## 2017-07-11 DIAGNOSIS — H401133 Primary open-angle glaucoma, bilateral, severe stage: Secondary | ICD-10-CM | POA: Diagnosis not present

## 2017-07-16 DIAGNOSIS — H02836 Dermatochalasis of left eye, unspecified eyelid: Secondary | ICD-10-CM | POA: Diagnosis not present

## 2017-07-16 DIAGNOSIS — H02833 Dermatochalasis of right eye, unspecified eyelid: Secondary | ICD-10-CM | POA: Diagnosis not present

## 2017-07-16 DIAGNOSIS — H401133 Primary open-angle glaucoma, bilateral, severe stage: Secondary | ICD-10-CM | POA: Diagnosis not present

## 2017-07-16 DIAGNOSIS — H31402 Unspecified choroidal detachment, left eye: Secondary | ICD-10-CM | POA: Diagnosis not present

## 2017-07-16 DIAGNOSIS — Z9842 Cataract extraction status, left eye: Secondary | ICD-10-CM | POA: Diagnosis not present

## 2017-07-16 DIAGNOSIS — Z9841 Cataract extraction status, right eye: Secondary | ICD-10-CM | POA: Diagnosis not present

## 2017-07-16 DIAGNOSIS — Z961 Presence of intraocular lens: Secondary | ICD-10-CM | POA: Diagnosis not present

## 2017-07-22 DIAGNOSIS — N139 Obstructive and reflux uropathy, unspecified: Secondary | ICD-10-CM | POA: Diagnosis not present

## 2017-07-22 DIAGNOSIS — I129 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 kidney disease, or unspecified chronic kidney disease: Secondary | ICD-10-CM | POA: Diagnosis not present

## 2017-07-22 DIAGNOSIS — N184 Chronic kidney disease, stage 4 (severe): Secondary | ICD-10-CM | POA: Diagnosis not present

## 2017-07-22 DIAGNOSIS — C61 Malignant neoplasm of prostate: Secondary | ICD-10-CM | POA: Diagnosis not present

## 2017-08-02 DIAGNOSIS — H401133 Primary open-angle glaucoma, bilateral, severe stage: Secondary | ICD-10-CM | POA: Diagnosis not present

## 2017-08-02 DIAGNOSIS — Z48810 Encounter for surgical aftercare following surgery on the sense organs: Secondary | ICD-10-CM | POA: Diagnosis not present

## 2017-08-06 DIAGNOSIS — N184 Chronic kidney disease, stage 4 (severe): Secondary | ICD-10-CM | POA: Diagnosis not present

## 2017-09-01 ENCOUNTER — Emergency Department (HOSPITAL_COMMUNITY): Payer: Medicare Other

## 2017-09-01 ENCOUNTER — Other Ambulatory Visit: Payer: Self-pay

## 2017-09-01 ENCOUNTER — Inpatient Hospital Stay (HOSPITAL_COMMUNITY)
Admission: EM | Admit: 2017-09-01 | Discharge: 2017-09-04 | DRG: 291 | Disposition: A | Discharge status: Home Health | Payer: Medicare Other | Attending: Family Medicine | Admitting: Family Medicine

## 2017-09-01 DIAGNOSIS — N189 Chronic kidney disease, unspecified: Secondary | ICD-10-CM

## 2017-09-01 DIAGNOSIS — H5462 Unqualified visual loss, left eye, normal vision right eye: Secondary | ICD-10-CM | POA: Diagnosis present

## 2017-09-01 DIAGNOSIS — Z7984 Long term (current) use of oral hypoglycemic drugs: Secondary | ICD-10-CM

## 2017-09-01 DIAGNOSIS — I130 Hypertensive heart and chronic kidney disease with heart failure and stage 1 through stage 4 chronic kidney disease, or unspecified chronic kidney disease: Principal | ICD-10-CM | POA: Diagnosis present

## 2017-09-01 DIAGNOSIS — H409 Unspecified glaucoma: Secondary | ICD-10-CM | POA: Diagnosis present

## 2017-09-01 DIAGNOSIS — I5033 Acute on chronic diastolic (congestive) heart failure: Secondary | ICD-10-CM | POA: Diagnosis not present

## 2017-09-01 DIAGNOSIS — Z8249 Family history of ischemic heart disease and other diseases of the circulatory system: Secondary | ICD-10-CM

## 2017-09-01 DIAGNOSIS — E785 Hyperlipidemia, unspecified: Secondary | ICD-10-CM | POA: Diagnosis present

## 2017-09-01 DIAGNOSIS — E1122 Type 2 diabetes mellitus with diabetic chronic kidney disease: Secondary | ICD-10-CM | POA: Diagnosis not present

## 2017-09-01 DIAGNOSIS — E876 Hypokalemia: Secondary | ICD-10-CM | POA: Diagnosis not present

## 2017-09-01 DIAGNOSIS — I509 Heart failure, unspecified: Secondary | ICD-10-CM

## 2017-09-01 DIAGNOSIS — Z8546 Personal history of malignant neoplasm of prostate: Secondary | ICD-10-CM

## 2017-09-01 DIAGNOSIS — Y92009 Unspecified place in unspecified non-institutional (private) residence as the place of occurrence of the external cause: Secondary | ICD-10-CM

## 2017-09-01 DIAGNOSIS — T501X6A Underdosing of loop [high-ceiling] diuretics, initial encounter: Secondary | ICD-10-CM | POA: Diagnosis present

## 2017-09-01 DIAGNOSIS — E1129 Type 2 diabetes mellitus with other diabetic kidney complication: Secondary | ICD-10-CM | POA: Diagnosis present

## 2017-09-01 DIAGNOSIS — R600 Localized edema: Secondary | ICD-10-CM | POA: Diagnosis not present

## 2017-09-01 DIAGNOSIS — R609 Edema, unspecified: Secondary | ICD-10-CM | POA: Diagnosis not present

## 2017-09-01 DIAGNOSIS — I5030 Unspecified diastolic (congestive) heart failure: Secondary | ICD-10-CM | POA: Diagnosis not present

## 2017-09-01 DIAGNOSIS — N184 Chronic kidney disease, stage 4 (severe): Secondary | ICD-10-CM | POA: Diagnosis present

## 2017-09-01 DIAGNOSIS — J9811 Atelectasis: Secondary | ICD-10-CM | POA: Diagnosis not present

## 2017-09-01 DIAGNOSIS — I10 Essential (primary) hypertension: Secondary | ICD-10-CM | POA: Diagnosis present

## 2017-09-01 DIAGNOSIS — R52 Pain, unspecified: Secondary | ICD-10-CM | POA: Diagnosis not present

## 2017-09-01 DIAGNOSIS — N179 Acute kidney failure, unspecified: Secondary | ICD-10-CM | POA: Diagnosis present

## 2017-09-01 DIAGNOSIS — Z91128 Patient's intentional underdosing of medication regimen for other reason: Secondary | ICD-10-CM

## 2017-09-01 DIAGNOSIS — F17210 Nicotine dependence, cigarettes, uncomplicated: Secondary | ICD-10-CM | POA: Diagnosis present

## 2017-09-01 DIAGNOSIS — E8770 Fluid overload, unspecified: Secondary | ICD-10-CM

## 2017-09-01 DIAGNOSIS — I4510 Unspecified right bundle-branch block: Secondary | ICD-10-CM | POA: Diagnosis not present

## 2017-09-01 LAB — CBC WITH DIFFERENTIAL/PLATELET
ABS IMMATURE GRANULOCYTES: 0 10*3/uL (ref 0.0–0.1)
BASOS ABS: 0 10*3/uL (ref 0.0–0.1)
BASOS PCT: 1 %
Eosinophils Absolute: 0.1 10*3/uL (ref 0.0–0.7)
Eosinophils Relative: 2 %
HCT: 40.7 % (ref 39.0–52.0)
HEMOGLOBIN: 12.9 g/dL — AB (ref 13.0–17.0)
Immature Granulocytes: 0 %
LYMPHS ABS: 1.3 10*3/uL (ref 0.7–4.0)
Lymphocytes Relative: 21 %
MCH: 31.3 pg (ref 26.0–34.0)
MCHC: 31.7 g/dL (ref 30.0–36.0)
MCV: 98.8 fL (ref 78.0–100.0)
MONOS PCT: 11 %
Monocytes Absolute: 0.7 10*3/uL (ref 0.1–1.0)
NEUTROS PCT: 65 %
Neutro Abs: 4 10*3/uL (ref 1.7–7.7)
PLATELETS: 248 10*3/uL (ref 150–400)
RBC: 4.12 MIL/uL — ABNORMAL LOW (ref 4.22–5.81)
RDW: 12.6 % (ref 11.5–15.5)
WBC: 6.2 10*3/uL (ref 4.0–10.5)

## 2017-09-01 LAB — COMPREHENSIVE METABOLIC PANEL
ALT: 21 U/L (ref 0–44)
ANION GAP: 8 (ref 5–15)
AST: 30 U/L (ref 15–41)
Albumin: 2.9 g/dL — ABNORMAL LOW (ref 3.5–5.0)
Alkaline Phosphatase: 111 U/L (ref 38–126)
BILIRUBIN TOTAL: 1 mg/dL (ref 0.3–1.2)
BUN: 27 mg/dL — ABNORMAL HIGH (ref 8–23)
CO2: 25 mmol/L (ref 22–32)
CREATININE: 2.99 mg/dL — AB (ref 0.61–1.24)
Calcium: 8.6 mg/dL — ABNORMAL LOW (ref 8.9–10.3)
Chloride: 110 mmol/L (ref 98–111)
GFR calc non Af Amer: 18 mL/min — ABNORMAL LOW (ref >60)
GFR, EST AFRICAN AMERICAN: 21 mL/min — AB (ref >60)
GLUCOSE: 176 mg/dL — AB (ref 70–99)
Potassium: 3.9 mmol/L (ref 3.5–5.1)
Sodium: 143 mmol/L (ref 135–145)
TOTAL PROTEIN: 6.4 g/dL — AB (ref 6.5–8.1)

## 2017-09-01 LAB — I-STAT TROPONIN, ED: TROPONIN I, POC: 0.01 ng/mL (ref 0.00–0.08)

## 2017-09-01 LAB — BRAIN NATRIURETIC PEPTIDE: B NATRIURETIC PEPTIDE 5: 363.9 pg/mL — AB (ref 0.0–100.0)

## 2017-09-01 LAB — GLUCOSE, CAPILLARY: Glucose-Capillary: 228 mg/dL — ABNORMAL HIGH (ref 70–99)

## 2017-09-01 MED ORDER — ACETAMINOPHEN 500 MG PO TABS: 1000.0000 mg | ORAL_TABLET | Freq: Four times a day (QID) | ORAL | Status: DC | PRN

## 2017-09-01 MED ORDER — AMLODIPINE BESYLATE 5 MG PO TABS
5.0000 mg | ORAL_TABLET | Freq: Every day | ORAL | Status: DC
  Administered 2017-09-02 – 2017-09-04 (×3): 5 mg via ORAL
  Filled 2017-09-01 (×3): qty 1

## 2017-09-01 MED ORDER — INSULIN ASPART 100 UNIT/ML ~~LOC~~ SOLN
0.0000 [IU] | Freq: Three times a day (TID) | SUBCUTANEOUS | Status: DC
  Administered 2017-09-02 – 2017-09-03 (×2): 2 [IU] via SUBCUTANEOUS
  Administered 2017-09-03 (×2): 1 [IU] via SUBCUTANEOUS
  Administered 2017-09-04: 2 [IU] via SUBCUTANEOUS

## 2017-09-01 MED ORDER — ATORVASTATIN CALCIUM 20 MG PO TABS
20.0000 mg | ORAL_TABLET | Freq: Every day | ORAL | Status: DC
  Administered 2017-09-02 – 2017-09-04 (×3): 20 mg via ORAL
  Filled 2017-09-01 (×3): qty 1

## 2017-09-01 MED ORDER — SODIUM CHLORIDE 0.9% FLUSH
3.0000 mL | Freq: Two times a day (BID) | INTRAVENOUS | Status: DC
  Administered 2017-09-01 – 2017-09-04 (×6): 3 mL via INTRAVENOUS

## 2017-09-01 MED ORDER — DORZOLAMIDE HCL-TIMOLOL MAL 2-0.5 % OP SOLN
1.0000 [drp] | Freq: Two times a day (BID) | OPHTHALMIC | Status: DC
  Administered 2017-09-01 – 2017-09-04 (×6): 1 [drp] via OPHTHALMIC
  Filled 2017-09-01: qty 10

## 2017-09-01 MED ORDER — FUROSEMIDE 20 MG PO TABS
40.0000 mg | ORAL_TABLET | Freq: Once | ORAL | Status: AC
  Administered 2017-09-01: 40 mg via ORAL
  Filled 2017-09-01: qty 2

## 2017-09-01 MED ORDER — FUROSEMIDE 10 MG/ML IJ SOLN
80.0000 mg | Freq: Once | INTRAMUSCULAR | Status: AC
  Administered 2017-09-01: 80 mg via INTRAVENOUS
  Filled 2017-09-01: qty 8

## 2017-09-01 MED ORDER — HEPARIN SODIUM (PORCINE) 5000 UNIT/ML IJ SOLN
5000.0000 [IU] | Freq: Three times a day (TID) | INTRAMUSCULAR | Status: DC
  Administered 2017-09-01 – 2017-09-02 (×2): 5000 [IU] via SUBCUTANEOUS
  Filled 2017-09-01 (×2): qty 1

## 2017-09-01 MED ORDER — ATROPINE SULFATE 1 % OP SOLN
1.0000 [drp] | Freq: Two times a day (BID) | OPHTHALMIC | Status: DC
  Administered 2017-09-01 – 2017-09-04 (×6): 1 [drp] via OPHTHALMIC
  Filled 2017-09-01: qty 2

## 2017-09-01 NOTE — ED Triage Notes (Signed)
Daughter called EMS because patient has lower leg swelling and pain in knee and lower leg area. Patient does not take lasix prescribed everyday because he does not like going to the bathroom all the time.

## 2017-09-01 NOTE — Progress Notes (Signed)
Admitted 77 y/o pt from ED. Oriented x4. daugthter claims pt has no peripheral vision . VS taken and recorded. Bed alarms on. Telemetry on.

## 2017-09-01 NOTE — H&P (Signed)
History and Physical   Elijah White QMV:[id number] DOB: March 29, 1940 DOA: 09/01/2017  PCP: [name], MD  Chief Complaint: pain and swelling of the legs  HPI: this 77 year old man with medical problems including hypertension, chronic diastolic heart failure, ongoing smoking, non-insulin-dependent diabetes, chronic kidney disease stage 4 who presents with lower extremity edema and pain.  At baseline the patient lives alone, continues to smoke about half a pack per day, reports being independent in his ADLs and IADLs and walks without assistance. He reports over the past few weeks he's had increasing pain and swelling in his legs. He reports seeing his nephrologist in April or May 2019 where he was recommended to increase his diuretic frequency, however he has been slow to adopt this. He reports to 3 months ago his weight was approximately 182 pounds, now he swelling in 200 pounds. He reports he's tried to increase his Lasix at home periodically throughout the week but has been unsuccessful in alleviating his symptoms. Due to the symptom persistence he sought medical care.  He denies any PND, orthopnea, chest pain, nausea, vomiting, fevers, chills.  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 He reports she's never had these symptoms before. He reports she is uncertain of what the cause of his CKD is. Is accompanied by his daughter Elijah White.  ED Course: the emergency department vital signs remarkable for systolic blood pressure 284X, otherwise vital signs are normal.  CMP remarkable for creatinine 2.99.  BNP elevated at 363.  Vascular lab pulmonary report did not reveal any DVTs, did reveal significant interstitial edema throughout lower extremities, Doppler waveforms suggestive of fluid overload. The patient was given furosemide 40 mg IV, Hospital medicine consulted for further management.  Review of Systems: A complete ROS was obtained; pertinent positives  negatives are denoted in the HPI. Otherwise, all systems are negative.   Past Medical History:  Diagnosis Date  . Diabetes mellitus without complication ([place])   . Elevated cholesterol with high triglycerides   . Hypertension    Social History   Socioeconomic History  . Marital status: Widowed    Spouse name: Not on file  . Number of children: Not on file  . Years of education: Not on file  . Highest education level: Not on file  Occupational History  . Not on file  Social Needs  . Financial resource strain: Not on file  . Food insecurity:    Worry: Not on file    Inability: Not on file  . Transportation needs:    Medical: Not on file    Non-medical: Not on file  Tobacco Use  . Smoking status: Current Every Day Smoker  . Smokeless tobacco: Never Used  Substance and Sexual Activity  . Alcohol use: No  . Drug use: No  . Sexual activity: Not on file  Lifestyle  . Physical activity:    Days per week: Not on file    Minutes per session: Not on file  . Stress: Not on file  Relationships  . Social connections:    Talks on phone: Not on file    Gets together: Not on file    Attends religious service: Not on file    Active member of club or organization: Not on file    Attends meetings of clubs or organizations: Not on file    Relationship status: Not on file  . Intimate partner violence:    Fear of current or ex partner:  Not on file    Emotionally abused: Not on file    Physically abused: Not on file    Forced sexual activity: Not on file  Other Topics Concern  . Not on file  Social History Narrative  . Not on file   Family History  Problem Relation Age of Onset  . CAD Mother     Physical Exam: Vitals:   09/01/17 1630 09/01/17 1645 09/01/17 1700 09/01/17 1855  BP: (!) 167/78 (!) 161/85 (!) 168/85 (!) 183/81  Pulse: 71 68 71 75  Resp: 18 18 14 20   Temp:      TempSrc:      SpO2: 99% 100% 100% 100%  Weight:      Height:       General: Appears calm and  comfortable.  Elderly black man. ENT: Grossly normal hearing, MMM. Half of teeth missing. Cardiovascular: RRR. No M/R/G.  Bilateral lower extremity edema present, 1-2+, painful upon palpation. Respiratory:  Verbally reduced breath sounds with crackles bilateral lower lobes, no conversational dyspnea. Abdomen: Soft, non-tender.  Skin: No rash or induration seen on limited exam. Musculoskeletal: Grossly normal tone BUE/BLE. Appropriate ROM.  Psychiatric: Grossly normal mood and affect. Neurologic: Moves all extremities in coordinated fashion.  I have personally reviewed the following labs, culture data, and imaging studies.  Assessment/Plan:  #Acute decompensated preserved EF heart failure As manifested by b/l LE edema and pain, elevated BNP, reduced breath sounds, subjective weight gain.  It is likely that he needs a step up in diuretic dose given degree of CKD.  Uncertain trigger. Plan: Will provide 80 mg IV furosemide and assess response. Patient reports preference for foley catheter (reports that condom cath did not work prior), daily weights, monitor I&O. Repeat TTE to re-evaluate cardiac function.  Continue CCB.  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

## 2017-09-01 NOTE — ED Provider Notes (Signed)
[facility] EMERGENCY DEPARTMENT Provider Note   CSN: [id number] Arrival date & time: 09/01/17  1505     History   Chief Complaint Chief Complaint  Patient presents with  . Leg Swelling    HPI JADIE COMAS is a 77 y.o. male.  HPI   RESEAN BRANDER is a 76 y.o. male, with a history of DM, hypercholesterolemia, HTN, and grade 1 diastolic dysfunction, presenting to the ED with bilateral lower extremity edema "for a long time."  States he thinks that it has been worse over the last 2 weeks.  Swelling arose simultaneously in both legs.  Both of his legs feel sore.  Patient admits to poor compliance with his Lasix.  He takes it sporadically, but states, "I do not like taking it because it makes me go to the bathroom."  His last dose was yesterday.  Patient's daughter, [name], is concerned about the amount of pain patient is experiencing in his lower extremities.  He is typically ambulatory without issue, however, over the last 2 days he has had to use a walker due to pain.  Denies shortness of breath, chest pain, orthopnea, cough, fever/chills, or any other complaints.    Past Medical History:  Diagnosis Date  . Diabetes mellitus without complication ([place])   . Elevated cholesterol with high triglycerides   . Hypertension     Patient Active Problem List   Diagnosis Date Noted  . Dizziness 08/13/2016  . Renal failure (ARF), acute on chronic (HCC) 08/13/2016  . Essential hypertension 08/13/2016  . Type 2 diabetes mellitus with renal manifestations ([place]) 10/22/2006  . BENIGN PROSTATIC HYPERTROPHY 10/22/2006  . COLONIC POLYPS, HX OF 10/22/2006    Past Surgical History:  Procedure Laterality Date  . PROSTATE SURGERY          Home Medications    Prior to Admission medications   Medication Sig Start Date End Date Taking? Authorizing Provider  acetaminophen (TYLENOL) 500 MG tablet Take 1,000 mg by mouth every 6 (six) hours as needed for headache (pain).    Yes [provider]  amLODipine (NORVASC) 10 MG tablet Take 0.5 tablets (5 mg total) by mouth daily. Patient taking differently: Take 10 mg by mouth daily.  08/16/16  Yes Domenic Polite, MD  atorvastatin (LIPITOR) 10 MG tablet Take 2 tablets (20 mg total) by mouth daily. Patient taking differently: Take 10 mg by mouth daily.  08/22/12  Yes Carmin Muskrat, MD  atropine 1 % ophthalmic solution Place 1 drop into the left eye 2 (two) times daily. 08/02/17  Yes [provider]  bimatoprost (LUMIGAN) 0.01 % SOLN Place 1 drop into the right eye at bedtime. 06/25/17  Yes [provider]  brimonidine (ALPHAGAN) 0.2 % ophthalmic solution Place 1 drop into the right eye 2 (two) times daily. 08/02/17  Yes [provider]  dorzolamide-timolol (COSOPT) 22.3-6.8 MG/ML ophthalmic solution Place 1 drop into the right eye 2 (two) times daily. 06/25/17  Yes [provider]  furosemide (LASIX) 40 MG tablet Take 80 mg by mouth every Monday, Wednesday, and Friday.  07/04/16  Yes [provider]  glipiZIDE (GLUCOTROL) 5 MG tablet Take 5 mg by mouth 2 (two) times daily before a meal. 01/11/16  Yes [provider]  pilocarpine (PILOCAR) 1 % ophthalmic solution Place 1 drop into both eyes 3 (three) times daily. 06/25/16  Yes [provider]  prednisoLONE acetate (PRED FORTE) 1 % ophthalmic suspension Place 1 drop into the left eye  3 (three) times daily. 08/30/17  Yes [provider]    Family History Family History  Problem Relation Age of Onset  . CAD Mother     Social History Social History   Tobacco Use  . Smoking status: Current Every Day Smoker  . Smokeless tobacco: Never Used  Substance Use Topics  . Alcohol use: No  . Drug use: No     Allergies   Patient has no known allergies.   Review of Systems Review of Systems  Constitutional: Negative for chills, diaphoresis and fever.  Respiratory: Negative for cough and shortness  of breath.   Cardiovascular: Positive for leg swelling. Negative for chest pain.  Gastrointestinal: Negative for abdominal pain, diarrhea, nausea and vomiting.  Neurological: Negative for weakness and numbness.  All other systems reviewed and are negative.    Physical Exam Updated Vital Signs BP (!) 176/90 (BP Location: Left Arm)   Pulse 82   Temp 98.6 F (37 C) (Oral)   Resp 20   Ht 5\' 5"  (1.651 m)   Wt 90.7 kg (200 lb)   SpO2 100%   BMI 33.28 kg/m   Physical Exam  Constitutional: He appears well-developed and well-nourished. No distress.  HENT:  Head: Normocephalic and atraumatic.  Eyes: Conjunctivae are normal.  Neck: Neck supple.  Cardiovascular: Normal rate, regular rhythm, normal heart sounds and intact distal pulses.  Pulmonary/Chest: Effort normal and breath sounds normal. No respiratory distress.  Abdominal: Soft. There is no tenderness. There is no guarding.  Musculoskeletal: He exhibits edema and tenderness.  Some edema, increased warmth, mild erythema, and tenderness throughout the bilateral lower extremities.  Edema appears to be worse in the right lower extremity.  Lymphadenopathy:    He has no cervical adenopathy.  Neurological: He is alert.  Skin: Skin is warm and dry. He is not diaphoretic.  Psychiatric: He has a normal mood and affect. His behavior is normal.  Nursing note and vitals reviewed.    ED Treatments / Results  Labs (all labs ordered are listed, but only abnormal results are displayed) Labs Reviewed  COMPREHENSIVE METABOLIC PANEL - Abnormal; Notable for the following components:      Result Value   Glucose, Bld 176 (*)    BUN 27 (*)    Creatinine, Ser 2.99 (*)    Calcium 8.6 (*)    Total Protein 6.4 (*)    Albumin 2.9 (*)    GFR calc non Af Amer 18 (*)    GFR calc Af Amer 21 (*)    All other components within normal limits  CBC WITH DIFFERENTIAL/PLATELET - Abnormal; Notable for the following components:   RBC 4.12 (*)     Hemoglobin 12.9 (*)    All other components within normal limits  BRAIN NATRIURETIC PEPTIDE - Abnormal; Notable for the following components:   B Natriuretic Peptide 363.9 (*)    All other components within normal limits  I-STAT TROPONIN, ED   BUN  Date Value Ref Range Status  09/01/2017 27 (H) 8 - 23 mg/dL Final    Comment:    Please note change in reference range.  08/16/2016 32 (H) 6 - 20 mg/dL Final  08/15/2016 30 (H) 6 - 20 mg/dL Final  08/14/2016 23 (H) 6 - 20 mg/dL Final   Creatinine, Ser  Date Value Ref Range Status  09/01/2017 2.99 (H) 0.61 - 1.24 mg/dL Final  08/16/2016 2.52 (H) 0.61 - 1.24 mg/dL Final  08/15/2016 2.38 (H) 0.61 - 1.24 mg/dL Final  08/14/2016 2.15 (H) 0.61 - 1.24 mg/dL Final     EKG None  Radiology Dg Chest 2 View  Result Date: 09/01/2017 CLINICAL DATA:  Patient with lower leg swelling. EXAM: CHEST - 2 VIEW COMPARISON:  Chest radiograph 08/13/2016 FINDINGS: Monitoring leads overlie the patient. Stable cardiomegaly. Tortuosity of the thoracic aorta. Bibasilar atelectasis. No pleural effusion or pneumothorax. Regional skeleton is unremarkable. IMPRESSION: No acute cardiopulmonary process.  Basilar atelectasis. Electronically Signed   By: Lovey Newcomer M.D.   On: 09/01/2017 15:55    Procedures Procedures (including critical care time)  Medications Ordered in ED Medications  furosemide (LASIX) tablet 40 mg (40 mg Oral Given 09/01/17 1613)     Initial Impression / Assessment and Plan / ED Course  I have reviewed the triage vital signs and the nursing notes.  Pertinent labs & imaging results that were available during my care of the patient were reviewed by me and considered in my medical decision making (see chart for details).  Clinical Course as of Sep 01 1900  Sun Sep 01, 2017  1825 Spoke with hospitalist, agrees to admit the patient.    [SJ]    Clinical Course User Index [SJ] Avik Leoni C, PA-C      Patient presents with worsening lower  extremity edema and pain.  Evidence of fluid overload on exam.  No DVT on duplex ultrasound.  BNP elevated.  No pulmonary edema noted on chest x-ray.  Creatinine elevated to 2.99 over last value of 2.52 about a year ago.  Hypoalbuminemia also noted.  Admission for continued management of fluid overload.  Findings and plan of care discussed with Davonna Belling, MD.   Vitals:   09/01/17 1530 09/01/17 1630 09/01/17 1645 09/01/17 1700  BP: (!) 161/72 (!) 167/78 (!) 161/85 (!) 168/85  Pulse: 75 71 68 71  Resp: 20 18 18 14   Temp:      TempSrc:      SpO2: 100% 99% 100% 100%  Weight:      Height:         Final Clinical Impressions(s) / ED Diagnoses   Final diagnoses:  Localized edema due to fluid overload  AKI (acute kidney injury) [facility])    ED Discharge Orders    None       Layla Maw 09/01/17 Toney Sang, MD 09/02/17 [phone]

## 2017-09-01 NOTE — Progress Notes (Signed)
VASCULAR LAB PRELIMINARY  PRELIMINARY  PRELIMINARY  PRELIMINARY  Bilateral lower extremity venous duplex completed.    Preliminary report:  There is no DVT or SVT noted in the bilateral lower extremities.  Significant interstitial edema noted throughout the lower extremities.  Doppler waveforms are pulsatile suggestive of fluid overload.   Called results to [facility], Joziyah Roblero, RVT 09/01/2017, 5:29 PM

## 2017-09-01 NOTE — ED Notes (Signed)
Patient transported to Vascular 

## 2017-09-02 ENCOUNTER — Inpatient Hospital Stay (HOSPITAL_COMMUNITY): Payer: Medicare Other

## 2017-09-02 ENCOUNTER — Encounter (HOSPITAL_COMMUNITY): Payer: Self-pay | Admitting: *Deleted

## 2017-09-02 DIAGNOSIS — I5030 Unspecified diastolic (congestive) heart failure: Secondary | ICD-10-CM

## 2017-09-02 LAB — BASIC METABOLIC PANEL
ANION GAP: 8 (ref 5–15)
BUN: 28 mg/dL — ABNORMAL HIGH (ref 8–23)
CALCIUM: 8.3 mg/dL — AB (ref 8.9–10.3)
CO2: 26 mmol/L (ref 22–32)
Chloride: 110 mmol/L (ref 98–111)
Creatinine, Ser: 3.06 mg/dL — ABNORMAL HIGH (ref 0.61–1.24)
GFR, EST AFRICAN AMERICAN: 21 mL/min — AB (ref >60)
GFR, EST NON AFRICAN AMERICAN: 18 mL/min — AB (ref >60)
Glucose, Bld: 126 mg/dL — ABNORMAL HIGH (ref 70–99)
POTASSIUM: 3.4 mmol/L — AB (ref 3.5–5.1)
SODIUM: 144 mmol/L (ref 135–145)

## 2017-09-02 LAB — GLUCOSE, CAPILLARY
GLUCOSE-CAPILLARY: 88 mg/dL (ref 70–99)
Glucose-Capillary: 104 mg/dL — ABNORMAL HIGH (ref 70–99)
Glucose-Capillary: 180 mg/dL — ABNORMAL HIGH (ref 70–99)
Glucose-Capillary: 169 mg/dL — ABNORMAL HIGH (ref 70–99)

## 2017-09-02 LAB — CBC
HCT: 37.6 % — ABNORMAL LOW (ref 39.0–52.0)
HEMOGLOBIN: 12 g/dL — AB (ref 13.0–17.0)
MCH: 31.3 pg (ref 26.0–34.0)
MCHC: 31.9 g/dL (ref 30.0–36.0)
MCV: 97.9 fL (ref 78.0–100.0)
Platelets: 224 10*3/uL (ref 150–400)
RBC: 3.84 MIL/uL — AB (ref 4.22–5.81)
RDW: 12.5 % (ref 11.5–15.5)
WBC: 5.2 10*3/uL (ref 4.0–10.5)

## 2017-09-02 LAB — HEMOGLOBIN A1C
HEMOGLOBIN A1C: 7.1 % — AB (ref 4.8–5.6)
MEAN PLASMA GLUCOSE: 157.07 mg/dL

## 2017-09-02 LAB — MAGNESIUM: MAGNESIUM: 1.7 mg/dL (ref 1.7–2.4)

## 2017-09-02 MED ORDER — HEPARIN SODIUM (PORCINE) 5000 UNIT/ML IJ SOLN
5000.0000 [IU] | Freq: Two times a day (BID) | INTRAMUSCULAR | Status: DC
  Administered 2017-09-02 – 2017-09-04 (×4): 5000 [IU] via SUBCUTANEOUS
  Filled 2017-09-02 (×4): qty 1

## 2017-09-02 MED ORDER — PREDNISOLONE ACETATE 1 % OP SUSP
1.0000 [drp] | Freq: Three times a day (TID) | OPHTHALMIC | Status: DC
  Administered 2017-09-02 – 2017-09-04 (×7): 1 [drp] via OPHTHALMIC
  Filled 2017-09-02: qty 5

## 2017-09-02 MED ORDER — POTASSIUM CHLORIDE CRYS ER 20 MEQ PO TBCR
20.0000 meq | EXTENDED_RELEASE_TABLET | Freq: Once | ORAL | Status: AC
  Administered 2017-09-02: 20 meq via ORAL
  Filled 2017-09-02: qty 1

## 2017-09-02 MED ORDER — FUROSEMIDE 10 MG/ML IJ SOLN
80.0000 mg | Freq: Every day | INTRAMUSCULAR | Status: DC
  Administered 2017-09-02: 80 mg via INTRAVENOUS
  Filled 2017-09-02: qty 8

## 2017-09-02 MED ORDER — LATANOPROST 0.005 % OP SOLN
1.0000 [drp] | Freq: Every day | OPHTHALMIC | Status: DC
  Administered 2017-09-02 – 2017-09-03 (×2): 1 [drp] via OPHTHALMIC
  Filled 2017-09-02: qty 2.5

## 2017-09-02 MED ORDER — PILOCARPINE HCL 1 % OP SOLN
1.0000 [drp] | Freq: Three times a day (TID) | OPHTHALMIC | Status: DC
  Administered 2017-09-02 – 2017-09-04 (×7): 1 [drp] via OPHTHALMIC
  Filled 2017-09-02: qty 15

## 2017-09-02 MED ORDER — BRIMONIDINE TARTRATE 0.2 % OP SOLN
1.0000 [drp] | Freq: Two times a day (BID) | OPHTHALMIC | Status: DC
  Administered 2017-09-02 – 2017-09-04 (×5): 1 [drp] via OPHTHALMIC
  Filled 2017-09-02: qty 5

## 2017-09-02 NOTE — Progress Notes (Signed)
  Echocardiogram 2D Echocardiogram has been performed.  Elijah White 09/02/2017, 8:40 AM

## 2017-09-02 NOTE — Progress Notes (Signed)
PROGRESS NOTE  Elijah White  OZD:[id number] DOB: [birth year] DOA: 09/01/2017 PCP: [name], MD   Brief Narrative: Elijah White is a 77 y.o. male with a history of chronic HFpEF, stage IV CKD, NIDT2DM, HTN, glaucoma with recent vitreous hemorrhage on the left, and ongoing tobacco use who presented to the ED with lower extremity swelling worsening over several weeks leading to pain limiting ambulation. He had been advised by his nephrologist to take higher doses of lasix but did not due to frustration with the increased urination this caused. He was found to be volume overloaded, 200lbs (from dry weight estimated at 180lbs, and hypertensive. BNP 363 and creatinine 2.99. LE dopplers without evidence of DVT. IV lasix was started and he was admitted.   Assessment & Plan: Active Problems:   Acute decompensated heart failure (HCC)  Acute on chronic HFpEF:  - Echo pending - Continue lasix 80mg  IV daily.  - Spoke with patient regarding necessity of taking po lasix daily though he could time the dose as needed (e.g. 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 - Monitor creatinine closely - Avoid nephrotoxins as able  Hypokalemia:  - Cautiously replace and recheck in AM.   HTN:  - Continue norvasc  HLD:  - Continue statin  NIDT2DM: HbA1c 7.1%.  - May benefit from stopping [employer] which we're holding.  - SSI while inpatient  Glaucoma: Under care of Dr. Edilia Bo, ophthalmology with recent left eye choroidal detachment. - Verified at length this morning with daughter the exact schedule of gtt's and all were reordered as they are at home with formulary substitution as needed.   Prostate CA: - Monitor UOP. Ok to use condom cath as long as no discomfort and UOP remains good.  DVT prophylaxis: Heparin q12h (to minimize risk of left eye hemorrhage) Code Status:  Full Family Communication: Daughter at bedside Disposition Plan: Home when improved.   Consultants:   None  Procedures:   None  Antimicrobials:  None   Subjective: Feels swelling is better. Breathing ok, but hasn't gotten up yet.   Objective: Vitals:   09/02/17 0020 09/02/17 0610 09/02/17 1119 09/02/17 1209  BP: (!) 152/80 (!) 173/81 (!) 148/76 (!) 172/88  Pulse: 76 76 76 83  Resp: 18 18  20   Temp:  99 F (37.2 C)  98.9 F (37.2 C)  TempSrc:  Oral  Oral  SpO2: 98% 98% 99% 97%  Weight:  87.3 kg (192 lb 7.4 oz)    Height:        Intake/Output Summary (Last 24 hours) at 09/02/2017 1446 Last data filed at 09/02/2017 1356 Gross per 24 hour  Intake 840 ml  Output 2301 ml  Net -1461 ml   Filed Weights   09/01/17 1508 09/01/17 2011 09/02/17 0610  Weight: 90.7 kg (200 lb) 89.9 kg (198 lb 3.2 oz) 87.3 kg (192 lb 7.4 oz)    Gen: 77 y.o. male in no distress  Pulm: Non-labored breathing room air. Clear to auscultation bilaterally.  CV: Regular rate and rhythm. No murmur, rub, or gallop. No JVD, 1+ pedal edema. GI: Abdomen soft, non-tender, non-distended, with normoactive bowel sounds. No organomegaly or masses felt. Ext: Warm, no deformities Skin: No rashes, lesions or ulcers Neuro: Alert and oriented. No focal neurological deficits. Psych: Judgement and insight appear normal. Mood & affect appropriate.   Data Reviewed: I have personally  reviewed following labs and imaging studies  CBC: Recent Labs  Lab 09/01/17 1520 09/02/17 0529  WBC 6.2 5.2  NEUTROABS 4.0  --   HGB 12.9* 12.0*  HCT 40.7 37.6*  MCV 98.8 97.9  PLT 248 761   Basic Metabolic Panel: Recent Labs  Lab 09/01/17 1520 09/02/17 0529  NA 143 144  K 3.9 3.4*  CL 110 110  CO2 25 26  GLUCOSE 176* 126*  BUN 27* 28*  CREATININE 2.99* 3.06*  CALCIUM 8.6* 8.3*  MG  --  1.7   GFR: Estimated Creatinine Clearance: 19.9 mL/min (A) (by C-G formula based on SCr of 3.06 mg/dL (H)). Liver Function  Tests: Recent Labs  Lab 09/01/17 1520  AST 30  ALT 21  ALKPHOS 111  BILITOT 1.0  PROT 6.4*  ALBUMIN 2.9*   No results for input(s): LIPASE, AMYLASE in the last 168 hours. No results for input(s): AMMONIA in the last 168 hours. Coagulation Profile: No results for input(s): INR, PROTIME in the last 168 hours. Cardiac Enzymes: No results for input(s): CKTOTAL, CKMB, CKMBINDEX, TROPONINI in the last 168 hours. BNP (last 3 results) No results for input(s): PROBNP in the last 8760 hours. HbA1C: Recent Labs    09/02/17 0529  HGBA1C 7.1*   CBG: Recent Labs  Lab 09/01/17 2057 09/02/17 0728 09/02/17 1110  GLUCAP 228* 104* 180*   Lipid Profile: No results for input(s): CHOL, HDL, LDLCALC, TRIG, CHOLHDL, LDLDIRECT in the last 72 hours. Thyroid Function Tests: No results for input(s): TSH, T4TOTAL, FREET4, T3FREE, THYROIDAB in the last 72 hours. Anemia Panel: No results for input(s): VITAMINB12, FOLATE, FERRITIN, TIBC, IRON, RETICCTPCT in the last 72 hours. Urine analysis:    Component Value Date/Time   COLORURINE STRAW (A) 08/13/2016 1300   APPEARANCEUR CLEAR 08/13/2016 1300   LABSPEC 1.009 08/13/2016 1300   PHURINE 6.0 08/13/2016 1300   GLUCOSEU NEGATIVE 08/13/2016 1300   HGBUR NEGATIVE 08/13/2016 1300   BILIRUBINUR NEGATIVE 08/13/2016 1300   KETONESUR NEGATIVE 08/13/2016 1300   PROTEINUR 100 (A) 08/13/2016 1300   UROBILINOGEN 1.0 08/22/2012 1402   NITRITE NEGATIVE 08/13/2016 1300   LEUKOCYTESUR NEGATIVE 08/13/2016 1300   No results found for this or any previous visit (from the past 240 hour(s)).    Radiology Studies: Dg Chest 2 View  Result Date: 09/01/2017 CLINICAL DATA:  Patient with lower leg swelling. EXAM: CHEST - 2 VIEW COMPARISON:  Chest radiograph 08/13/2016 FINDINGS: Monitoring leads overlie the patient. Stable cardiomegaly. Tortuosity of the thoracic aorta. Bibasilar atelectasis. No pleural effusion or pneumothorax. Regional skeleton is unremarkable.  IMPRESSION: No acute cardiopulmonary process.  Basilar atelectasis. Electronically Signed   By: Lovey Newcomer M.D.   On: 09/01/2017 15:55    Scheduled Meds: . amLODipine  5 mg Oral Daily  . atorvastatin  20 mg Oral Daily  . atropine  1 drop Left Eye BID  . brimonidine  1 drop Right Eye BID  . dorzolamide-timolol  1 drop Right Eye BID  . furosemide  80 mg Intravenous Daily  . heparin  5,000 Units Subcutaneous Q8H  . insulin aspart  0-9 Units Subcutaneous TID WC  . latanoprost  1 drop Right Eye QHS  . pilocarpine  1 drop Right Eye TID  . prednisoLONE acetate  1 drop Left Eye TID  . sodium chloride flush  3 mL Intravenous Q12H   Continuous Infusions:   LOS: 1 day   Time spent: 25 minutes.  Patrecia Pour, MD [facility] [email or web address] Password [facility] 09/02/2017,  2:46 PM

## 2017-09-03 ENCOUNTER — Encounter (HOSPITAL_COMMUNITY): Payer: Self-pay | Admitting: *Deleted

## 2017-09-03 LAB — GLUCOSE, CAPILLARY
GLUCOSE-CAPILLARY: 135 mg/dL — AB (ref 70–99)
GLUCOSE-CAPILLARY: 128 mg/dL — AB (ref 70–99)
GLUCOSE-CAPILLARY: 151 mg/dL — AB (ref 70–99)
GLUCOSE-CAPILLARY: 218 mg/dL — AB (ref 70–99)

## 2017-09-03 LAB — BASIC METABOLIC PANEL
Anion gap: 6 (ref 5–15)
BUN: 33 mg/dL — AB (ref 8–23)
CALCIUM: 8 mg/dL — AB (ref 8.9–10.3)
CHLORIDE: 110 mmol/L (ref 98–111)
CO2: 26 mmol/L (ref 22–32)
CREATININE: 3.38 mg/dL — AB (ref 0.61–1.24)
GFR calc Af Amer: 18 mL/min — ABNORMAL LOW (ref >60)
GFR, EST NON AFRICAN AMERICAN: 16 mL/min — AB (ref >60)
GLUCOSE: 184 mg/dL — AB (ref 70–99)
Potassium: 3.3 mmol/L — ABNORMAL LOW (ref 3.5–5.1)
Sodium: 142 mmol/L (ref 135–145)

## 2017-09-03 MED ORDER — FUROSEMIDE 80 MG PO TABS
80.0000 mg | ORAL_TABLET | Freq: Every day | ORAL | Status: DC
  Administered 2017-09-03 – 2017-09-04 (×2): 80 mg via ORAL
  Filled 2017-09-03 (×2): qty 1

## 2017-09-03 MED ORDER — POTASSIUM CHLORIDE CRYS ER 20 MEQ PO TBCR
40.0000 meq | EXTENDED_RELEASE_TABLET | Freq: Once | ORAL | Status: AC
  Administered 2017-09-03: 40 meq via ORAL
  Filled 2017-09-03: qty 2

## 2017-09-03 NOTE — Care Management Note (Signed)
Case Management Note  Patient Details  Name: Elijah White MRN: [id number] Date of Birth: 1940/11/30  Subjective/Objective:    CHF               Action/Plan: Patient lives at home alone, supportive daughter - nurse; PCP: [name], MD; has private insurance with [facility] with prescription drug coverage; CM talked to daughter Lavella Lemons [phone]) at length. She is the caregiver for the patient and requested [place] services with [place]; Dan with [facility] called for arrangements. CM will continue to follow for progression of care.  Expected Discharge Date:    possibly 09/04/2017              Expected Discharge Plan:  [place]  In-House Referral:   [facility]  Discharge planning Services  CM Consult Choice offered to:  Adult Children  DME Arranged:  [facility] rolling DME Agency:  [facility] Arranged:  RN, Disease Management, PT, OT, Nurse's Aide [place] Agency:  [facility]  Status of Service:  In process, will continue to follow  Sherrilyn Rist [phone] 09/03/2017, 2:35 PM

## 2017-09-03 NOTE — Progress Notes (Signed)
Patient up in chair today and he ambulated in hallway using front wheel walker, with stand-by assistance from [place], [employer].  Elijah White tolerated well.

## 2017-09-03 NOTE — Progress Notes (Signed)
PROGRESS NOTE  [name]  EPP:[id number] DOB: August 09, 1940 DOA: 09/01/2017 PCP: [name], MD   Brief Narrative: Elijah White is a 77 y.o. male with a history of chronic HFpEF, stage IV CKD, NIDT2DM, HTN, glaucoma with recent vitreous hemorrhage on the left, and ongoing tobacco use who presented to the ED with lower extremity swelling worsening over several weeks leading to pain limiting ambulation. He had been advised by his nephrologist to take higher doses of lasix but did not due to frustration with the increased urination this caused. He was found to be volume overloaded, 200lbs (from dry weight estimated at 180lbs, and hypertensive. BNP 363 and creatinine 2.99. LE dopplers without evidence of DVT. IV lasix was started and he was admitted.   Assessment & Plan: Active Problems:   Acute decompensated heart failure (HCC)  Acute on chronic HFpEF:  - Echo is stable, EF 65-70%, G1DD, no WMA's, mild LVH. Tx underlying HTN - Convert to po lasix 80mg , creatinine up, unsure if this will be an effective dose (or if Tx failure was all due to not taking it frequently enough) and monitor, likely discharge in AM.  - Spoke with patient again regarding necessity of taking po lasix daily though he could time the dose as needed (e.g. 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 - Monitor creatinine closely in AM. Slightly up, so deescalating diuresis as above. - Avoid nephrotoxins as able  Hypokalemia:  - Replace again today given ongoing diuresis. Recheck w/Mg in AM.   HTN:  - Continue norvasc, consider replacing this if LE edema persists despite euvolemia.  HLD:  - Continue statin  NIDT2DM: HbA1c 7.1%.  - Hold [employer] - SSI while inpatient  Glaucoma: Under care of Dr. Edilia Bo, ophthalmology with recent left eye choroidal detachment. - Continue home gtt's.     - OT consulted  Prostate CA: - Monitor UOP. Ok to use condom cath as long as no discomfort and UOP remains good.  DVT prophylaxis: Heparin q12h (to minimize risk of left eye hemorrhage) Code Status: Full Family Communication: Daughter by phone Disposition Plan: Home when improved, possibly next 24-48hrs. PT/OT eval   Consultants:   None  Procedures:   None  Antimicrobials:  None   Subjective: A little short of breath when walking this morning but reports some improvement in swelling.   Objective: Vitals:   09/02/17 2357 09/03/17 0508 09/03/17 0749 09/03/17 1156  BP: (!) 163/84 (!) 169/83 (!) 167/80 124/79  Pulse: 71 67 68 66  Resp: 18 18 20 16   Temp: 98.2 F (36.8 C) 98 F (36.7 C) 98.4 F (36.9 C) (!) 97.5 F (36.4 C)  TempSrc: Oral Oral Oral Oral  SpO2: 100% 99% 99% 100%  Weight:  86.5 kg (190 lb 9.6 oz)    Height:        Intake/Output Summary (Last 24 hours) at 09/03/2017 1217 Last data filed at 09/03/2017 1000 Gross per 24 hour  Intake 960 ml  Output 2551 ml  Net -1591 ml   Filed Weights   09/01/17 2011 09/02/17 0610 09/03/17 0508  Weight: 89.9 kg (198 lb 3.2 oz) 87.3 kg (192 lb 7.4 oz) 86.5 kg (190 lb 9.6 oz)   Gen: 77 y.o. male in no distress Pulm: Nonlabored breathing room air. Clear. CV: Regular rate and rhythm. No murmur, rub, or gallop. No JVD, 1+  pitting dependent edema. GI: Abdomen soft, non-tender, non-distended, with normoactive bowel sounds.  Ext: Warm, no deformities Skin: No rashes, lesions or ulcers on visualized skin.  Neuro: Alert and oriented. Diminished visual acuity and restricted peripheral visual fields, unchanged. Otherwise no focal neurological deficits. Psych: Judgement and insight appear fair. Mood euthymic & affect congruent. Behavior is appropriate.    Data Reviewed: I have personally reviewed following labs and imaging studies  CBC: Recent Labs  Lab 09/01/17 1520 09/02/17 0529  WBC 6.2 5.2  NEUTROABS 4.0  --   HGB  12.9* 12.0*  HCT 40.7 37.6*  MCV 98.8 97.9  PLT 248 676   Basic Metabolic Panel: Recent Labs  Lab 09/01/17 1520 09/02/17 0529 09/03/17 0420  NA 143 144 142  K 3.9 3.4* 3.3*  CL 110 110 110  CO2 25 26 26   GLUCOSE 176* 126* 184*  BUN 27* 28* 33*  CREATININE 2.99* 3.06* 3.38*  CALCIUM 8.6* 8.3* 8.0*  MG  --  1.7  --    GFR: Estimated Creatinine Clearance: 17.9 mL/min (A) (by C-G formula based on SCr of 3.38 mg/dL (H)). Liver Function Tests: Recent Labs  Lab 09/01/17 1520  AST 30  ALT 21  ALKPHOS 111  BILITOT 1.0  PROT 6.4*  ALBUMIN 2.9*   No results for input(s): LIPASE, AMYLASE in the last 168 hours. No results for input(s): AMMONIA in the last 168 hours. Coagulation Profile: No results for input(s): INR, PROTIME in the last 168 hours. Cardiac Enzymes: No results for input(s): CKTOTAL, CKMB, CKMBINDEX, TROPONINI in the last 168 hours. BNP (last 3 results) No results for input(s): PROBNP in the last 8760 hours. HbA1C: Recent Labs    09/02/17 0529  HGBA1C 7.1*   CBG: Recent Labs  Lab 09/02/17 0728 09/02/17 1110 09/02/17 1649 09/02/17 2138 09/03/17 0753  GLUCAP 104* 180* 88 169* 135*   Lipid Profile: No results for input(s): CHOL, HDL, LDLCALC, TRIG, CHOLHDL, LDLDIRECT in the last 72 hours. Thyroid Function Tests: No results for input(s): TSH, T4TOTAL, FREET4, T3FREE, THYROIDAB in the last 72 hours. Anemia Panel: No results for input(s): VITAMINB12, FOLATE, FERRITIN, TIBC, IRON, RETICCTPCT in the last 72 hours. Urine analysis:    Component Value Date/Time   COLORURINE STRAW (A) 08/13/2016 1300   APPEARANCEUR CLEAR 08/13/2016 1300   LABSPEC 1.009 08/13/2016 1300   PHURINE 6.0 08/13/2016 1300   GLUCOSEU NEGATIVE 08/13/2016 1300   HGBUR NEGATIVE 08/13/2016 1300   BILIRUBINUR NEGATIVE 08/13/2016 1300   KETONESUR NEGATIVE 08/13/2016 1300   PROTEINUR 100 (A) 08/13/2016 1300   UROBILINOGEN 1.0 08/22/2012 1402   NITRITE NEGATIVE 08/13/2016 1300    LEUKOCYTESUR NEGATIVE 08/13/2016 1300   No results found for this or any previous visit (from the past 240 hour(s)).    Radiology Studies: Dg Chest 2 View  Result Date: 09/01/2017 CLINICAL DATA:  Patient with lower leg swelling. EXAM: CHEST - 2 VIEW COMPARISON:  Chest radiograph 08/13/2016 FINDINGS: Monitoring leads overlie the patient. Stable cardiomegaly. Tortuosity of the thoracic aorta. Bibasilar atelectasis. No pleural effusion or pneumothorax. Regional skeleton is unremarkable. IMPRESSION: No acute cardiopulmonary process.  Basilar atelectasis. Electronically Signed   By: Lovey Newcomer M.D.   On: 09/01/2017 15:55    Scheduled Meds: . amLODipine  5 mg Oral Daily  . atorvastatin  20 mg Oral Daily  . atropine  1 drop Left Eye BID  . brimonidine  1 drop Right Eye BID  . dorzolamide-timolol  1 drop Right Eye BID  . furosemide  80 mg Oral Daily  . heparin  5,000 Units Subcutaneous Q12H  . insulin aspart  0-9 Units Subcutaneous TID WC  . latanoprost  1 drop Right Eye QHS  . pilocarpine  1 drop Right Eye TID  . prednisoLONE acetate  1 drop Left Eye TID  . sodium chloride flush  3 mL Intravenous Q12H   Continuous Infusions:   LOS: 2 days   Time spent: 25 minutes.  Patrecia Pour, MD [facility] [email or web address] Password [facility] 09/03/2017, 12:17 PM

## 2017-09-03 NOTE — Plan of Care (Signed)
  Problem: Clinical Measurements: Goal: Cardiovascular complication will be avoided Outcome: Progressing   Problem: Activity: Goal: Risk for activity intolerance will decrease Outcome: Progressing   Problem: Safety: Goal: Ability to remain free from injury will improve Outcome: Progressing   

## 2017-09-03 NOTE — Evaluation (Signed)
Occupational Therapy Evaluation Patient Details Name: Elijah White MRN: [id number] DOB: June 05, 1940 Today's Date: 09/03/2017    History of Present Illness 77 year old man with medical problems including hypertension, chronic diastolic heart failure, ongoing smoking, non-insulin-dependent diabetes, chronic kidney disease stage 4 who presents with lower extremity edema and pain.   Clinical Impression   PTA, 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e was living alone and was independent in ADLs and IADLs. Pt currently requiring Min Guard A for ADLs standing at sink, Min A for LB ADLs, and Min for functional mobility with single hand held A. 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 Pt requiring Mod cues throughout session for visual deficits. Pt would benefit from further acute OT to facilitate safe dc. Recommend dc to home with HHOT for further OT to optimize safety, independence with ADLs, and return to PLOF.      Follow Up Recommendations  Home health OT;Supervision/Assistance - 24 hour    Equipment Recommendations  None recommended by OT    Recommendations for Other Services PT consult     Precautions / Restrictions Precautions Precautions: Fall      Mobility Bed Mobility Overal bed mobility: Needs Assistance Bed Mobility: Supine to Sit;Sit to Supine     Supine to sit: Supervision Sit to supine: Supervision   General bed mobility comments: Supervision for safety  Transfers Overall transfer level: Needs assistance Equipment used: None Transfers: Sit to/from Stand Sit to Stand: Min guard         General transfer comment: Min Guard A for safety    Balance Overall balance assessment: Mild deficits observed, not formally tested                                         ADL either performed or assessed with clinical judgement   ADL Overall ADL's : Needs assistance/impaired Eating/Feeding: Set up;Sitting   Grooming: Oral care;Min  guard;Standing Grooming Details (indicate cue type and reason): Pt performing oral care at sink with Min Guard A for safety and Mod VCs for visual deficits. Pt requiring cues to locate grooming items and manage poor depth perception Upper Body Bathing: Set up;Supervision/ safety;Sitting   Lower Body Bathing: Min guard;Sit to/from stand   Upper Body Dressing : Set up;Supervision/safety;Sitting   Lower Body Dressing: Min guard;Sit to/from stand Lower Body Dressing Details (indicate cue type and reason): Pt able to adjust socks at EOB without difficulty Toilet Transfer: Min guard;Ambulation(Simulated in room)           Functional mobility during ADLs: Minimal assistance General ADL Comments: Pt requiring Min A for functional mobility due to decreased balance and vision deficits. 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 Pt requiring Mod vcs for visual deficits.     Perception     Praxis      Pertinent Vitals/Pain Pain Assessment: No/denies pain     Hand Dominance Right   Extremity/Trunk Assessment Upper Extremity Assessment Upper Extremity Assessment: Overall WFL for tasks assessed   Lower Extremity Assessment Lower Extremity Assessment: Defer to PT evaluation   Cervical / Trunk Assessment Cervical / Trunk Assessment: Normal   Communication Communication Communication: No difficulties   Cognition Arousal/Alertness: Awake/alert  Behavior During Therapy: WFL for tasks assessed/performed Overall Cognitive Status: Within Functional Limits for tasks assessed                                     General Comments       Exercises     Shoulder Instructions      Home Living Family/patient expects to be  discharged to:: Private residence Living Arrangements: Alone   Type of Home: House       Home Layout: One level     Bathroom Shower/Tub: Occupational psychologist: Standard     Home Equipment: Environmental consultant - 2 wheels   Additional Comments: Pt reporting he lives in a house alone.       Prior Functioning/Environment Level of Independence: Independent        Comments: ADLs and IADLs. Friends and daughter provide transportation        OT Problem List: Decreased strength;Decreased range of motion;Decreased activity tolerance;Impaired balance (sitting and/or standing);Impaired vision/perception;Decreased coordination;Decreased knowledge of precautions      OT Treatment/Interventions: Self-care/ADL training;Therapeutic exercise;Energy conservation;DME and/or AE instruction;Therapeutic activities;Patient/family education    OT Goals(Current goals can be found in the care plan section) Acute Rehab OT Goals Patient Stated Goal: Go home tomorrow OT Goal Formulation: With patient Time For Goal Achievement: 09/17/17 Potential to Achieve Goals: Good ADL Goals Pt Will Perform Grooming: with modified independence;standing Pt Will Perform Upper Body Dressing: with modified independence;sitting Pt Will Perform Lower Body Dressing: with modified independence;sit to/from stand Pt Will Transfer to Toilet: with modified independence;ambulating;regular height toilet Pt Will Perform Toileting - Clothing Manipulation and hygiene: with modified independence;sit to/from stand  OT Frequency: Min 2X/week   Barriers to D/C:            Co-evaluation              AM-PAC PT "6 Clicks" Daily Activity     Outcome Measure Help from another person eating meals?: None Help from another person taking care of personal grooming?: A Little Help from another person toileting, which includes using toliet, bedpan, or urinal?: A Little Help from another person bathing (including washing, rinsing,  drying)?: A Little Help from another person to put on and taking off regular upper body clothing?: A Little Help from another person to put on and taking off regular lower body clothing?: A Little 6 Click Score: 19   End of Session Equipment Utilized During Treatment: Gait belt Nurse Communication: Mobility status  Activity Tolerance: Patient tolerated treatment well Patient left: in bed;with call bell/phone within reach;with nursing/sitter in room(with Nursing Student)  OT Visit Diagnosis: Unsteadiness on feet (R26.81);Other abnormalities of gait and mobility (R26.89);Muscle weakness (generalized) (M62.81);Low vision, both eyes (H54.2)                Time: 4765-4650 OT Time Calculation (min): 20 min Charges:  OT General Charges $OT Visit: 1 Visit OT Evaluation $OT Eval Moderate Complexity: 1 Mod G-Codes:     [place] [facility]OT, OTR/L Acute Rehab Pager: [phone] Office: [place] 09/03/2017, 4:54 PM

## 2017-09-04 ENCOUNTER — Encounter (HOSPITAL_COMMUNITY): Payer: Self-pay

## 2017-09-04 DIAGNOSIS — I509 Heart failure, unspecified: Secondary | ICD-10-CM

## 2017-09-04 LAB — URINALYSIS, ROUTINE W REFLEX MICROSCOPIC
BILIRUBIN URINE: NEGATIVE
Glucose, UA: 50 mg/dL — AB
Hgb urine dipstick: NEGATIVE
Ketones, ur: NEGATIVE mg/dL
Leukocytes, UA: NEGATIVE
Nitrite: NEGATIVE
Protein, ur: >=300 mg/dL — AB
Specific Gravity, Urine: 1.01 (ref 1.005–1.030)
pH: 6 (ref 5.0–8.0)

## 2017-09-04 LAB — GLUCOSE, CAPILLARY
GLUCOSE-CAPILLARY: 187 mg/dL — AB (ref 70–99)
GLUCOSE-CAPILLARY: 119 mg/dL — AB (ref 70–99)
Glucose-Capillary: 113 mg/dL — ABNORMAL HIGH (ref 70–99)

## 2017-09-04 LAB — BASIC METABOLIC PANEL
Anion gap: 9 (ref 5–15)
BUN: 30 mg/dL — AB (ref 8–23)
CALCIUM: 8.4 mg/dL — AB (ref 8.9–10.3)
CO2: 25 mmol/L (ref 22–32)
CREATININE: 3.28 mg/dL — AB (ref 0.61–1.24)
Chloride: 108 mmol/L (ref 98–111)
GFR calc Af Amer: 19 mL/min — ABNORMAL LOW (ref >60)
GFR calc non Af Amer: 17 mL/min — ABNORMAL LOW (ref >60)
GLUCOSE: 130 mg/dL — AB (ref 70–99)
Potassium: 3.5 mmol/L (ref 3.5–5.1)
Sodium: 142 mmol/L (ref 135–145)

## 2017-09-04 MED ORDER — FUROSEMIDE 40 MG PO TABS: 80.0000 mg | ORAL_TABLET | Freq: Every day | ORAL | 0 refills | Status: DC

## 2017-09-04 MED ORDER — POTASSIUM CHLORIDE CRYS ER 20 MEQ PO TBCR: 40.0000 meq | EXTENDED_RELEASE_TABLET | Freq: Once | ORAL | Status: DC

## 2017-09-04 MED ORDER — POTASSIUM CHLORIDE ER 10 MEQ PO TBCR: 20.0000 meq | EXTENDED_RELEASE_TABLET | Freq: Every day | ORAL | 0 refills | Status: DC

## 2017-09-04 NOTE — Progress Notes (Signed)
Occupational Therapy Treatment Patient Details Name: Elijah White MRN: [id number] DOB: 28-Jul-1940 Today's Date: 09/04/2017    History of present illness 77 year old man with medical problems including hypertension, chronic diastolic heart failure, ongoing smoking, non-insulin-dependent diabetes, chronic kidney disease stage 4 who presents with lower extremity edema and pain.   OT comments  Pt making progress with functional goals. Possible d/c home this afternoon  Follow Up Recommendations  Home health OT;Supervision/Assistance - 24 hour    Equipment Recommendations  None recommended by OT    Recommendations for Other Services      Precautions / Restrictions Precautions Precautions: Fall Precaution Comments: L eye blindness Restrictions Weight Bearing Restrictions: No       Mobility Bed Mobility Overal bed mobility: Needs Assistance Bed Mobility: Supine to Sit     Supine to sit: Supervision     General bed mobility comments: OOB in chair at entry  Transfers Overall transfer level: Needs assistance Equipment used: None;Rolling walker (2 wheeled) Transfers: Sit to/from Stand Sit to Stand: Supervision         General transfer comment: Supervision for safety, good techinque and hand placement.    Balance Overall balance assessment: Mild deficits observed, not formally tested                                         ADL either performed or assessed with clinical judgement   ADL Overall ADL's : Needs assistance/impaired     Grooming: Min guard;Standing;Wash/dry hands;Wash/dry face       Lower Body Bathing: Min guard;Sit to/from stand   Upper Body Dressing : Set up;Supervision/safety;Sitting       Toilet Transfer: Ambulation;Supervision/safety;RW;BSC   Toileting- Clothing Manipulation and Hygiene: Minimal assistance;Sit to/from stand       Functional mobility during ADLs: Supervision/safety       Vision Baseline  Vision/History: Glaucoma Patient Visual Report: (L eye blindness) Vision Assessment?: Yes Depth Perception: Undershoots   Perception     Praxis      Cognition Arousal/Alertness: Awake/alert Behavior During Therapy: WFL for tasks assessed/performed Overall Cognitive Status: Within Functional Limits for tasks assessed                                          Exercises     Shoulder Instructions       General Comments      Pertinent Vitals/ Pain       Pain Assessment: No/denies pain  Home Living Family/patient expects to be discharged to:: Private residence Living Arrangements: Alone   Type of Home: House Home Access: Level entry     Home Layout: One level     Bathroom Shower/Tub: Occupational psychologist: Standard     Home Equipment: Environmental consultant - 2 wheels   Additional Comments: Pt reporting he lives in a house alone.       Prior Functioning/Environment Level of Independence: Independent        Comments: ADLs and IADLs. Friends and daughter provide transportation   Frequency  Min 2X/week        Progress Toward Goals  OT Goals(current goals can now be found in the care plan section)  Progress towards OT goals: Progressing toward goals  Acute Rehab OT Goals Patient Stated Goal: go home today  Plan  Discharge plan remains appropriate    Co-evaluation                 AM-PAC PT "6 Clicks" Daily Activity     Outcome Measure   Help from another person eating meals?: None Help from another person taking care of personal grooming?: A Little Help from another person toileting, which includes using toliet, bedpan, or urinal?: A Little Help from another person bathing (including washing, rinsing, drying)?: A Little Help from another person to put on and taking off regular upper body clothing?: A Little Help from another person to put on and taking off regular lower body clothing?: A Little 6 Click Score: 19    End of  Session Equipment Utilized During Treatment: Gait belt;Other (comment);Rolling walker(BSC)  OT Visit Diagnosis: Unsteadiness on feet (R26.81);Other abnormalities of gait and mobility (R26.89);Muscle weakness (generalized) (M62.81);Low vision, both eyes (H54.2)   Activity Tolerance Patient tolerated treatment well   Patient Left in bed;with call bell/phone within reach;with nursing/sitter in room   Nurse Communication      Functional Assessment Tool Used: AM-PAC 6 Clicks Daily Activity   Time: 7672-0947 OT Time Calculation (min): 25 min  Charges: OT G-codes **NOT FOR INPATIENT CLASS** Functional Assessment Tool Used: AM-PAC 6 Clicks Daily Activity OT General Charges $OT Visit: 1 Visit OT Treatments $Self Care/Home Management : 8-22 mins $Therapeutic Activity: 8-22 mins     Britt Bottom 09/04/2017, 1:44 PM

## 2017-09-04 NOTE — Consult Note (Addendum)
   [facility] Inpatient Consult   09/04/2017  HAIM HANSSON October 28, 1940 [id number]   Patient screened for potential [place] [facility] services. Patient is in the [facility] of the [place] [facility] services under patient's [employer] plan.  Came by to speak with patient and he is in nursing care, and MD came in as well. Patient had previously been out reached for medication adherence with [place].  Dr. [name] - listed as the primary care provider.  Progression meeting - patient was having issues with eye drops.  Will follow up. Please place a [facility] Care Management consult or for questions contact:   Natividad Brood, RN BSN [facility] Liaison  [phone] business mobile phone Toll free office [phone]   1110 am:  Met with the patient at the bedside.  Patient states he had received a letter that Dr. Baird Cancer was no longer his primary care provider.  Patient called his daughter via his cell phone.  HIPAA verified.  Spoke with his daughter [name] she states she saw the letter and she was alright with the physician the insurance had recommended.   1545:  Unit Secretary notified that patient has an appointment with Dr. Baird Cancer and that the information per the MD office was not accurate and the patient is still with [facility].  Natividad Brood, RN BSN [facility] Liaison  [phone] business mobile phone Toll free office [phone]

## 2017-09-04 NOTE — Discharge Summary (Signed)
Physician Discharge Summary  Elijah White:[id number] DOB: 03-24-1940 DOA: 09/01/2017  PCP: [name], MD  Admit date: 09/01/2017 Discharge date: 09/04/2017  Time spent: 40 minutes  Recommendations for Outpatient Follow-up:  1. Follow up outpatient CBC/CMP at outpatient follow up (BMP to be checked with home health this weekend, but will need f/u next week with PCP or nephrology as well) 2. Continue lasix 80 mg daily (previously he'd been noncompliant with the 3 times weekly dosing per his admission and when speaking with his daughter), follow volume status and kidney function, adjust as needed 3. Discharged with 20 meq potassium given need for supplemental K with diuresis here.  Will need to follow closely with his kidney function. 4. Home health to follow BMP this weekend, results to be sent to PCP. 5. Ensure follow up as scheduled with PCP 6. Ensure follow up with nephrology, they should call for an appointment (discussed with his primary nephrologist)   Discharge Diagnoses:  Principal Problem:   Acute decompensated heart failure ([place]) Active Problems:   Type 2 diabetes mellitus with renal manifestations ([place])   Renal failure (ARF), acute on chronic [facility])   Essential hypertension   Discharge Condition: stable  Filed Weights   09/02/17 0610 09/03/17 0508 09/04/17 0408  Weight: 87.3 kg (192 lb 7.4 oz) 86.5 kg (190 lb 9.6 oz) 85.7 kg (188 lb 14.4 oz)    History of present illness:  Elijah White is a 77 y.o. male with a history of chronic HFpEF, stage IV CKD, NIDT2DM, HTN, glaucoma with recent vitreous hemorrhage on the left, and ongoing tobacco use who presented to the ED with lower extremity swelling worsening over several weeks leading to pain limiting ambulation. He had been advised by his nephrologist to take higher doses of lasix but did not due to frustration with the increased urination this caused. He was found to be volume overloaded, 200lbs (from dry weight  estimated at 180lbs, and hypertensive. BNP 363 and creatinine 2.99. LE dopplers without evidence of DVT. IV lasix was started and he was admitted.   He improved with IV lasix.  He was transitioned to PO lasix with bump in his creatinine to 3.38.  On the day of discharge, his creatinine had begun to slightly downtrend.  LE edema has improved and weights improved as well.  Planning to discharge with outpatient follow up.  Home health to check BMP this weekend.  Will need subsequent follow up next week with PCP or nephrology.    Hospital Course:  Acute on chronic HFpEF:  - Echo is stable, EF 65-70%, G1DD, no WMA's, mild LVH.  - Convert to po lasix 80mg , creatinine up to 3.38 peaked, now slightly downtrending.  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 - Creatinine has improved slightly with PO lasix - good UOP - Avoid nephrotoxins as able - UA with proteinuria (noted in 2014 as well)  Hypokalemia:  - Improved today - Given need for recurrent supplementation here with consistent diuresis, will discharge with 20 meq daily, but important to follow labs closely with kidney function  HTN:  - Continue norvasc, consider replacing this if LE edema persists despite euvolemia.  HLD:  -  Continue statin  NIDT2DM: HbA1c 7.1%.  - resume home meds at discharge  Glaucoma: Under care of Dr. Edilia Bo, ophthalmology with recent left eye choroidal detachment. - Continue home gtt's.   - OT consulted  Hx of Prostate CA: - f/u outpatient  Procedures: Echo Study Conclusions  - Left ventricle: The cavity size was normal. Wall thickness was   increased in a pattern of mild LVH. Systolic  function was   vigorous. The estimated ejection fraction was in the range of 65%   to 70%. Wall motion was normal; there were no regional wall   motion abnormalities. Doppler parameters are consistent with   abnormal left ventricular relaxation (grade 1 diastolic   dysfunction).   LE [place] Final Interpretation: Right: There is no evidence of deep vein thrombosis in the lower extremity. There is no evidence of superficial venous thrombosis. Interstitial fluid noted throughout. Doppler signal is pulsatile suggestive of fluid overload Left: There is no evidence of superficial venous thrombosis. Interstitial fluid noted throughout. Doppler signal is pulsatile suggestive of fluid overload  Consultations:  none  Discharge Exam: Vitals:   09/04/17 1017 09/04/17 1202  BP: (!) 170/83 (!) 152/85  Pulse: 76 69  Resp:  18  Temp:  (!) 97.5 F (36.4 C)  SpO2: 100% 100%   Feeling better. Would like to go home today.  General: No acute distress. Cardiovascular: Heart sounds show a regular rate, and rhythm. No gallops or rubs. No murmurs. No JVD. Lungs: Clear to auscultation bilaterally with good air movement. No rales, rhonchi or wheezes. Abdomen: Soft, nontender, nondistended with normal active bowel sounds. No masses. No hepatosplenomegaly. Neurological: Alert and oriented 3. Moves all extremities 4. Cranial nerves II through XII grossly intact. Skin: Warm and dry. No rashes or lesions. Extremities: No clubbing or cyanosis. Trace-1+ edema.  Palpable pedal pulses. Psychiatric: Mood and affect are normal. Insight and judgment are appropirate.  Discharge Instructions   Discharge Instructions    (HEART FAILURE PATIENTS) Call MD:  Anytime you have any of the following symptoms: 1) 3 pound weight gain in 24 hours or 5 pounds in 1 week 2) shortness of breath, with or without a dry hacking cough 3) swelling in the hands, feet or stomach 4) if you have to sleep on extra pillows at night in order  to breathe.   Complete by:  As directed    Call MD for:  difficulty breathing, headache or visual disturbances   Complete by:  As directed    Call MD for:  extreme fatigue   Complete by:  As directed    Call MD for:  persistant dizziness or light-headedness   Complete by:  As directed    Call MD for:  persistant nausea and vomiting   Complete by:  As directed    Call MD for:  redness, tenderness, or signs of infection (pain, swelling, redness, odor or green/yellow discharge around incision site)   Complete by:  As directed    Call MD for:  severe uncontrolled pain   Complete by:  As directed    Call MD for:  temperature >100.4   Complete by:  As directed    Diet - low sodium heart healthy   Complete by:  As directed    Discharge instructions   Complete by:  As directed    You were seen for a heart failure exacerbation.    You've improved with lasix.  We will change your dose to daily, hopefully this will help with  your swelling.  You'll need repeat labs within 1 week.  [facility] should give you a call regarding labs and an appointment next week (if you don't hear from them, please call).  I also started you on a low dose of potassium.  Take this daily, but you'll need to follow up repeat labs to determine if this is something you'll need to take long term.    Return for new, recurrent, or worsening symptoms.  Please ask your PCP to request records from this hospitalization so they know what was done and what the next steps will be.   Increase activity slowly   Complete by:  As directed      Allergies as of 09/04/2017   No Known Allergies     Medication List    TAKE these medications   acetaminophen 500 MG tablet Commonly known as:  TYLENOL Take 1,000 mg by mouth every 6 (six) hours as needed for headache (pain).   amLODipine 10 MG tablet Commonly known as:  NORVASC Take 0.5 tablets (5 mg total) by mouth daily. What changed:  how much to take    atorvastatin 10 MG tablet Commonly known as:  LIPITOR Take 2 tablets (20 mg total) by mouth daily. What changed:  how much to take   atropine 1 % ophthalmic solution Place 1 drop into the left eye 2 (two) times daily.   bimatoprost 0.01 % Soln Commonly known as:  LUMIGAN Place 1 drop into the right eye at bedtime.   brimonidine 0.2 % ophthalmic solution Commonly known as:  ALPHAGAN Place 1 drop into the right eye 2 (two) times daily.   dorzolamide-timolol 22.3-6.8 MG/ML ophthalmic solution Commonly known as:  COSOPT Place 1 drop into the right eye 2 (two) times daily.   furosemide 40 MG tablet Commonly known as:  LASIX Take 2 tablets (80 mg total) by mouth daily. What changed:  when to take this   glipiZIDE 5 MG tablet Commonly known as:  GLUCOTROL Take 5 mg by mouth 2 (two) times daily before a meal.   pilocarpine 1 % ophthalmic solution Commonly known as:  PILOCAR Place 1 drop into both eyes 3 (three) times daily.   potassium chloride 10 MEQ tablet Commonly known as:  K-DUR Take 2 tablets (20 mEq total) by mouth daily for 14 days. (follow up for repeat labs within 1 week to determine long term need and need for adjustment)   prednisoLONE acetate 1 % ophthalmic suspension Commonly known as:  PRED FORTE Place 1 drop into the left eye 3 (three) times daily.            Durable Medical Equipment  (From admission, onward)        Start     Ordered   09/03/17 1440  For home use only DME Walker rolling  Once    Question:  Patient needs a walker to treat with the following condition  Answer:  CHF (congestive heart failure) ([place])   09/03/17 1439     No Known Allergies Follow-up Information    [facility].   Why:  They will do your home health care at your home Contact information: [address] [place] [address] [phone]        [name], MD. Go on 09/10/2017.   Specialty:  Internal Medicine Why:  at 2:30  pm; please try to keep your apt or call to reschedule Contact information: [address] [place] [address] [phone]        Estanislado Emms, MD Follow up.   Specialty:  Nephrology Why:  You should get a call regarding a follow up appointment.  Please call if you don't hear from them within the week. Contact information: [place]  [address] [phone]            The results of significant diagnostics from this hospitalization (including imaging, microbiology, ancillary and laboratory) are listed below for reference.    Significant Diagnostic Studies: Dg Chest 2 View  Result Date: 09/01/2017 CLINICAL DATA:  Patient with lower leg swelling. EXAM: CHEST - 2 VIEW COMPARISON:  Chest radiograph 08/13/2016 FINDINGS: Monitoring leads overlie the patient. Stable cardiomegaly. Tortuosity of the thoracic aorta. Bibasilar atelectasis. No pleural effusion or pneumothorax. Regional skeleton is unremarkable. IMPRESSION: No acute cardiopulmonary process.  Basilar atelectasis. Electronically Signed   By: Lovey Newcomer M.D.   On: 09/01/2017 15:55    Microbiology: No results found for this or any previous visit (from the past 240 hour(s)).   Labs: Basic Metabolic Panel: Recent Labs  Lab 09/01/17 1520 09/02/17 0529 09/03/17 0420 09/04/17 0503  NA 143 144 142 142  K 3.9 3.4* 3.3* 3.5  CL 110 110 110 108  CO2 25 26 26 25   GLUCOSE 176* 126* 184* 130*  BUN 27* 28* 33* 30*  CREATININE 2.99* 3.06* 3.38* 3.28*  CALCIUM 8.6* 8.3* 8.0* 8.4*  MG  --  1.7  --   --    Liver Function Tests: Recent Labs  Lab 09/01/17 1520  AST 30  ALT 21  ALKPHOS 111  BILITOT 1.0  PROT 6.4*  ALBUMIN 2.9*   No results for input(s): LIPASE, AMYLASE in the last 168 hours. No results for input(s): AMMONIA in the last 168 hours. CBC: Recent Labs  Lab 09/01/17 1520 09/02/17 0529  WBC 6.2 5.2  NEUTROABS 4.0  --   HGB 12.9* 12.0*  HCT 40.7  37.6*  MCV 98.8 97.9  PLT 248 224   Cardiac Enzymes: No results for input(s): CKTOTAL, CKMB, CKMBINDEX, TROPONINI in the last 168 hours. BNP: BNP (last 3 results) Recent Labs    09/01/17 1520  BNP 363.9*    ProBNP (last 3 results) No results for input(s): PROBNP in the last 8760 hours.  CBG: Recent Labs  Lab 09/03/17 1221 09/03/17 1739 09/03/17 2125 09/04/17 0741 09/04/17 1158  GLUCAP 128* 151* 218* 113* 187*       Signed:  Fayrene Helper MD.  [facility] 09/04/2017, 4:42 PM

## 2017-09-04 NOTE — Progress Notes (Addendum)
Discussed discharge instruction with daughter and patient.  Encouraged pt to keep all follow up appointments and to take medication as prescribed by MD.  Prescriptions called in to pt pharmacy

## 2017-09-04 NOTE — Progress Notes (Signed)
CM talked to daughter [name], patient received a letter in mail from his insurance provider stating that he will need a new PCP and he was assigned a new PCP by [facility]; CM informed [name] to locate letter or call the insurance co for MD name and make an apt for follow up care; Patient is to be seen at [facility] office by her NP; follow up apt made for September 10, 2017 at 2:30 pm; Aneta Mins [phone]

## 2017-09-04 NOTE — Care Management Important Message (Signed)
Important Message  Patient Details  Name: HERNY SCURLOCK MRN: [id number] Date of Birth: 08/02/1940   Medicare Important Message Given:  Yes    Barb Merino Jullian Previti 09/04/2017, 2:56 PM

## 2017-09-04 NOTE — Evaluation (Signed)
Physical Therapy Evaluation Patient Details Name: Elijah White MRN: [id number] DOB: 14-Aug-1940 Today's Date: 09/04/2017   History of Present Illness  77 year old man with medical problems including hypertension, chronic diastolic heart failure, ongoing smoking, non-insulin-dependent diabetes, chronic kidney disease stage 4 who presents with lower extremity edema and pain.    Clinical Impression  Pt admitted with above diagnosis. Pt currently with functional limitations due to the deficits listed below (see PT Problem List). PTA, pt living alone mod I with all mobility with RW. Upon eval pt presents with mild strength, balance and visual deficits. Ambulating unit without LOB on RA, VSS, supervision level. Rec HHPT to work on balance and safety. No concerns for safe return home from PT standpoint.  Pt will benefit from skilled PT to increase their independence and safety with mobility to allow discharge to the venue listed below.       Follow Up Recommendations Home health PT    Equipment Recommendations  None recommended by PT    Recommendations for Other Services       Precautions / Restrictions Precautions Precautions: Fall Precaution Comments: L eye blindness Restrictions Weight Bearing Restrictions: No      Mobility  Bed Mobility               General bed mobility comments: OOB in chair at entry  Transfers Overall transfer level: Needs assistance Equipment used: None;Rolling walker (2 wheeled) Transfers: Sit to/from Stand Sit to Stand: Supervision         General transfer comment: Supervision for safety, good techinque and hand placement.  Ambulation/Gait Ambulation/Gait assistance: Supervision;Min guard Gait Distance (Feet): 300 Feet Assistive device: Rolling walker (2 wheeled) Gait Pattern/deviations: Step-to pattern;Step-through pattern;Decreased stride length Gait velocity: decreased   General Gait Details: Patient ambulating with RW without LOB.  short step length in unfamiliar places with fruniture to navigate as patient does not see well. When patient is in open hallway, increased step length and velocity. VSS during session on RA  Stairs            Wheelchair Mobility    Modified Rankin (Stroke Patients Only)       Balance Overall balance assessment: Mild deficits observed, not formally tested                                           Pertinent Vitals/Pain Pain Assessment: No/denies pain    Home Living Family/patient expects to be discharged to:: Private residence Living Arrangements: Alone   Type of Home: House Home Access: Level entry     Home Layout: One level Home Equipment: Walker - 2 wheels Additional Comments: Pt reporting he lives in a house alone.     Prior Function Level of Independence: Independent         Comments: ADLs and IADLs. Friends and daughter provide transportation     Hand Dominance   Dominant Hand: Right    Extremity/Trunk Assessment   Upper Extremity Assessment Upper Extremity Assessment: Overall WFL for tasks assessed    Lower Extremity Assessment Lower Extremity Assessment: (BLE gross strength 4/5)    Cervical / Trunk Assessment Cervical / Trunk Assessment: Normal  Communication   Communication: No difficulties  Cognition Arousal/Alertness: Awake/alert Behavior During Therapy: WFL for tasks assessed/performed Overall Cognitive Status: Within Functional Limits for tasks assessed  General Comments      Exercises     Assessment/Plan    PT Assessment Patient needs continued PT services  PT Problem List Decreased strength;Decreased activity tolerance;Decreased mobility;Decreased balance       PT Treatment Interventions DME instruction;Gait training;Functional mobility training;Stair training;Therapeutic activities;Therapeutic exercise    PT Goals (Current goals can be found in the  Care Plan section)  Acute Rehab PT Goals Patient Stated Goal: go home today PT Goal Formulation: With patient Time For Goal Achievement: 09/11/17 Potential to Achieve Goals: Good    Frequency Min 3X/week   Barriers to discharge        Co-evaluation               AM-PAC PT "6 Clicks" Daily Activity  Outcome Measure Difficulty turning over in bed (including adjusting bedclothes, sheets and blankets)?: None Difficulty moving from lying on back to sitting on the side of the bed? : None Difficulty sitting down on and standing up from a chair with arms (e.g., wheelchair, bedside commode, etc,.)?: None Help needed moving to and from a bed to chair (including a wheelchair)?: None Help needed walking in hospital room?: A Little Help needed climbing 3-5 steps with a railing? : A Little 6 Click Score: 22    End of Session Equipment Utilized During Treatment: Gait belt Activity Tolerance: Patient tolerated treatment well Patient left: in chair;with call bell/phone within reach Nurse Communication: Mobility status PT Visit Diagnosis: Unsteadiness on feet (R26.81);Muscle weakness (generalized) (M62.81);Difficulty in walking, not elsewhere classified (R26.2)    Time: 8502-7741 PT Time Calculation (min) (ACUTE ONLY): 30 min   Charges:   PT Evaluation $PT Eval Low Complexity: 1 Low PT Treatments $Gait Training: 8-22 mins   PT G Codes:       Reinaldo Berber, PT, DPT Acute Rehab Services Pager: [phone]    Reinaldo Berber 09/04/2017, 12:31 PM

## 2017-10-20 ENCOUNTER — Emergency Department (HOSPITAL_COMMUNITY)
Admission: EM | Admit: 2017-10-20 | Discharge: 2017-10-20 | Disposition: A | Discharge status: Home / Self Care | Payer: Medicare Other | Attending: Emergency Medicine | Admitting: Emergency Medicine

## 2017-10-20 ENCOUNTER — Emergency Department (HOSPITAL_COMMUNITY): Payer: Medicare Other

## 2017-10-20 DIAGNOSIS — R55 Syncope and collapse: Secondary | ICD-10-CM | POA: Diagnosis not present

## 2017-10-20 DIAGNOSIS — E1122 Type 2 diabetes mellitus with diabetic chronic kidney disease: Secondary | ICD-10-CM | POA: Diagnosis not present

## 2017-10-20 DIAGNOSIS — N189 Chronic kidney disease, unspecified: Secondary | ICD-10-CM | POA: Diagnosis not present

## 2017-10-20 DIAGNOSIS — F17210 Nicotine dependence, cigarettes, uncomplicated: Secondary | ICD-10-CM | POA: Diagnosis not present

## 2017-10-20 DIAGNOSIS — Z79899 Other long term (current) drug therapy: Secondary | ICD-10-CM | POA: Insufficient documentation

## 2017-10-20 DIAGNOSIS — I129 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 kidney disease, or unspecified chronic kidney disease: Secondary | ICD-10-CM | POA: Diagnosis not present

## 2017-10-20 DIAGNOSIS — Z7984 Long term (current) use of oral hypoglycemic drugs: Secondary | ICD-10-CM | POA: Insufficient documentation

## 2017-10-20 DIAGNOSIS — N390 Urinary tract infection, site not specified: Secondary | ICD-10-CM | POA: Diagnosis not present

## 2017-10-20 LAB — URINALYSIS, ROUTINE W REFLEX MICROSCOPIC
Bilirubin Urine: NEGATIVE
Glucose, UA: NEGATIVE mg/dL
Ketones, ur: NEGATIVE mg/dL
Nitrite: NEGATIVE
PH: 5 (ref 5.0–8.0)
Protein, ur: >=300 mg/dL — AB
SPECIFIC GRAVITY, URINE: 1.011 (ref 1.005–1.030)

## 2017-10-20 LAB — BASIC METABOLIC PANEL
Anion gap: 9 (ref 5–15)
BUN: 38 mg/dL — AB (ref 8–23)
CHLORIDE: 112 mmol/L — AB (ref 98–111)
CO2: 21 mmol/L — ABNORMAL LOW (ref 22–32)
CREATININE: 3.36 mg/dL — AB (ref 0.61–1.24)
Calcium: 8.6 mg/dL — ABNORMAL LOW (ref 8.9–10.3)
GFR, EST AFRICAN AMERICAN: 19 mL/min — AB (ref >60)
GFR, EST NON AFRICAN AMERICAN: 16 mL/min — AB (ref >60)
Glucose, Bld: 205 mg/dL — ABNORMAL HIGH (ref 70–99)
POTASSIUM: 4.4 mmol/L (ref 3.5–5.1)
SODIUM: 142 mmol/L (ref 135–145)

## 2017-10-20 LAB — CBC
HCT: 38.3 % — ABNORMAL LOW (ref 39.0–52.0)
Hemoglobin: 12.3 g/dL — ABNORMAL LOW (ref 13.0–17.0)
MCH: 31.5 pg (ref 26.0–34.0)
MCHC: 32.1 g/dL (ref 30.0–36.0)
MCV: 98.2 fL (ref 78.0–100.0)
PLATELETS: 246 10*3/uL (ref 150–400)
RBC: 3.9 MIL/uL — ABNORMAL LOW (ref 4.22–5.81)
RDW: 13 % (ref 11.5–15.5)
WBC: 6.9 10*3/uL (ref 4.0–10.5)

## 2017-10-20 LAB — I-STAT TROPONIN, ED: Troponin i, poc: 0.01 ng/mL (ref 0.00–0.08)

## 2017-10-20 MED ORDER — SODIUM CHLORIDE 0.9 % IV SOLN
1.0000 g | Freq: Once | INTRAVENOUS | Status: AC
  Administered 2017-10-20: 1 g via INTRAVENOUS
  Filled 2017-10-20: qty 10

## 2017-10-20 MED ORDER — FUROSEMIDE 20 MG PO TABS
40.0000 mg | ORAL_TABLET | Freq: Once | ORAL | Status: AC
  Administered 2017-10-20: 40 mg via ORAL
  Filled 2017-10-20: qty 2

## 2017-10-20 MED ORDER — CEPHALEXIN 250 MG PO CAPS: 250.0000 mg | ORAL_CAPSULE | Freq: Four times a day (QID) | ORAL | 0 refills | Status: DC

## 2017-10-20 NOTE — ED Notes (Signed)
Pt ambulatory with walker. Pt reported some weakness. Pt also reports some pain to L upper back. MD Melina Copa notified

## 2017-10-20 NOTE — ED Triage Notes (Signed)
Pt arrived via [facility] from [employer] after witnesses reported pt "passed out" while in line. Pt remembers only waking up on the floor with people around him. Pt denies striking head, no c/o pain. Pt has hx of CHF. Mild LE edema noted at triage. Pt denies SOB. Lungs clear. EMs v/s 1536/82, hr66, Sp02 97%ra, cbg 183 (+DM). Pt is alert and oriented x4.

## 2017-10-20 NOTE — ED Notes (Addendum)
Pt family states that she is leaving to go home. 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

## 2017-10-20 NOTE — ED Notes (Signed)
Patient transported to CT 

## 2017-10-20 NOTE — Discharge Instructions (Signed)
Your evaluated in the emergency department for a syncopal event.  You had a CAT scan EKG and lab work.  You were found to have a urinary tract infection.  We have started you on some antibiotics and you will need to pick up your prescription and continue them.  It will be important for you to follow-up with your regular doctors.  Please return if any worsening symptoms.

## 2017-10-20 NOTE — ED Provider Notes (Signed)
[facility] EMERGENCY DEPARTMENT Provider Note   CSN: [id number] Arrival date & time: 10/20/17  1037     History   Chief Complaint Chief Complaint  Patient presents with  . Loss of Consciousness    HPI Elijah White is a 77 y.o. male.  He has a history of diabetes peripheral neuropathy hypertension and recently was troubled with increased peripheral edema.  His daughter is helping with history.  It sounds like he is in consistently taking his Lasix and recently had that dosage adjusted.  He has been having more fluid in his legs.  He was at [employer] today after singing in the choir at church and standing in line.  He was noticing his neuropathy in his feet and then it sounds like by bystanders he had a syncopal event.  He did strike his head.  He does not recall feeling lightheaded or weak or any chest pain prior to the episode.  Currently he just feels tired here but denies any other complaints.  He is never had a syncopal event before.  There is been no recent cough or chest pain or vomiting or diarrhea or urinary symptoms.  No known fever or chills.  The history is provided by the patient.  Loss of Consciousness   This is a new problem. The current episode started 1 to 2 hours ago. The problem has been resolved. He lost consciousness for a period of less than one minute. The problem is associated with normal activity. Associated symptoms include malaise/fatigue and visual change (has glaucoma and needs surgery). Pertinent negatives include abdominal pain, back pain, bladder incontinence, chest pain, fever, focal sensory loss, focal weakness, headaches, nausea, palpitations, seizures, slurred speech, vertigo and vomiting. He has tried nothing for the symptoms. His past medical history is significant for HTN. His past medical history does not include CVA or seizures.    Past Medical History:  Diagnosis Date  . Diabetes mellitus without complication ([place])   . Elevated  cholesterol with high triglycerides   . Hypertension     Patient Active Problem List   Diagnosis Date Noted  . Acute decompensated heart failure ([place]) 09/01/2017  . Dizziness 08/13/2016  . Renal failure (ARF), acute on chronic (HCC) 08/13/2016  . Essential hypertension 08/13/2016  . 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 BENIGN PROSTATIC HYPERTROPHY 10/22/2006  . COLONIC POLYPS, HX OF 10/22/2006    Past Surgical History:  Procedure Laterality Date  . PROSTATE SURGERY          Home Medications    Prior to Admission medications   Medication Sig Start Date End Date Taking? Authorizing Provider  acetaminophen (TYLENOL) 500 MG tablet Take 1,000 mg by mouth every 6 (six) hours as needed for headache (pain).    [provider]  amLODipine (NORVASC) 10 MG tablet Take 0.5 tablets (5 mg total) by mouth daily. Patient taking differently: Take 10 mg by mouth daily.  08/16/16   Domenic Polite, MD  atorvastatin (LIPITOR) 10 MG tablet Take 2 tablets (20 mg total) by mouth daily. Patient taking differently: Take 10 mg by mouth daily.  08/22/12   Carmin Muskrat, MD  atropine 1 % ophthalmic solution Place 1 drop into the left eye 2 (two) times daily. 08/02/17   [provider]  bimatoprost (LUMIGAN) 0.01 % SOLN Place 1 drop into the right eye at bedtime. 06/25/17   [provider]  brimonidine (ALPHAGAN) 0.2 % ophthalmic solution Place 1 drop  into the right eye 2 (two) times daily. 08/02/17   [provider]  dorzolamide-timolol (COSOPT) 22.3-6.8 MG/ML ophthalmic solution Place 1 drop into the right eye 2 (two) times daily. 06/25/17   [provider]  furosemide (LASIX) 40 MG tablet Take 2 tablets (80 mg total) by mouth daily. 09/04/17 10/04/17  Elodia Florence., MD  glipiZIDE (GLUCOTROL) 5 MG tablet Take 5 mg by mouth 2 (two) times daily before a meal. 01/11/16   [provider]  pilocarpine (PILOCAR) 1 %  ophthalmic solution Place 1 drop into both eyes 3 (three) times daily. 06/25/16   [provider]  potassium chloride (K-DUR) 10 MEQ tablet Take 2 tablets (20 mEq total) by mouth daily for 14 days. (follow up for repeat labs within 1 week to determine long term plan/adjustments) 09/04/17 09/18/17  Elodia Florence., MD  prednisoLONE acetate (PRED FORTE) 1 % ophthalmic suspension Place 1 drop into the left eye 3 (three) times daily. 08/30/17   [provider]    Family History Family History  Problem Relation Age of Onset  . CAD Mother     Social History Social History   Tobacco Use  . Smoking status: Current Every Day Smoker  . Smokeless tobacco: Never Used  Substance Use Topics  . Alcohol use: No  . Drug use: No     Allergies   Patient has no known allergies.   Review of Systems Review of Systems  Constitutional: Positive for malaise/fatigue. Negative for chills and fever.  HENT: Negative for ear pain and sore throat.   Eyes: Negative for pain and visual disturbance.  Respiratory: Negative for cough and shortness of breath.   Cardiovascular: Positive for leg swelling and syncope. Negative for chest pain and palpitations.  Gastrointestinal: Negative for abdominal pain, nausea and vomiting.  Genitourinary: Negative for bladder incontinence, dysuria and hematuria.  Musculoskeletal: Negative for arthralgias, back pain and neck pain.  Skin: Negative for color change and rash.  Neurological: Positive for syncope. Negative for vertigo, focal weakness, seizures and headaches.  All other systems reviewed and are negative.    Physical Exam Updated Vital Signs BP (!) 167/92   Pulse 93   Resp 19   SpO2 100%   Physical Exam  Constitutional: He appears well-developed and well-nourished.  HENT:  Head: Normocephalic.  He has approximately 4 cm fatty lesion at his occiput.  Daughter states that is been there a long time.  He also has a smaller tender hematoma  on the crown of his head.  No active bleeding.  Eyes: Conjunctivae are normal. Right eye exhibits no discharge. Left eye exhibits no discharge.  Neck: Neck supple.  Cardiovascular: Normal rate, regular rhythm and normal heart sounds.  Pulmonary/Chest: Effort normal.  Abdominal: Soft. He exhibits no mass. There is no tenderness. There is no guarding.  Musculoskeletal: Normal range of motion. He exhibits edema (2+ symmetric). He exhibits no tenderness or deformity.  Neurological: He is alert. GCS eye subscore is 4. GCS verbal subscore is 5. GCS motor subscore is 6.  Skin: Skin is warm and dry. Capillary refill takes less than 2 seconds.  Psychiatric: He has a normal mood and affect.  Nursing note and vitals reviewed.    ED Treatments / Results  Labs (all labs ordered are listed, but only abnormal results are displayed) Labs Reviewed  BASIC METABOLIC PANEL - Abnormal; Notable for the following components:      Result Value   Chloride 112 (*)  CO2 21 (*)    Glucose, Bld 205 (*)    BUN 38 (*)    Creatinine, Ser 3.36 (*)    Calcium 8.6 (*)    GFR calc non Af Amer 16 (*)    GFR calc Af Amer 19 (*)    All other components within normal limits  CBC - Abnormal; Notable for the following components:   RBC 3.90 (*)    Hemoglobin 12.3 (*)    HCT 38.3 (*)    All other components within normal limits  URINALYSIS, ROUTINE W REFLEX MICROSCOPIC - Abnormal; Notable for the following components:   APPearance CLOUDY (*)    Hgb urine dipstick SMALL (*)    Protein, ur >=300 (*)    Leukocytes, UA LARGE (*)    WBC, UA >50 (*)    Bacteria, UA MANY (*)    All other components within normal limits  CBG MONITORING, ED  I-STAT TROPONIN, ED    EKG EKG Interpretation  Date/Time:  Sunday October 20 2017 10:39:58 EDT Ventricular Rate:  76 PR Interval:    QRS Duration: 111 QT Interval:  411 QTC Calculation: 463 R Axis:   89 Text Interpretation:  Sinus rhythm Probable left atrial enlargement  Consider right ventricular hypertrophy similar to prior 6/19 Confirmed by Aletta Edouard [id number]) on 10/20/2017 10:48:07 AM   Radiology Ct Head Wo Contrast  Result Date: 10/20/2017 CLINICAL DATA:  Minor head trauma. EXAM: CT HEAD WITHOUT CONTRAST TECHNIQUE: Contiguous axial images were obtained from the base of the skull through the vertex without intravenous contrast. COMPARISON:  08/13/2016 FINDINGS: Brain: Cerebral atrophy. Moderate low density in the periventricular white matter likely related to small vessel disease. No mass lesion, hemorrhage, hydrocephalus, acute infarct, intra-axial, or extra-axial fluid collection. Vascular: Intracranial atherosclerosis. Skull: No significant soft tissue swelling. No skull fracture. Note is made of a sebaceous cyst about the occipital scalp which measures on the order of 4.0 cm. Sinuses/Orbits: Surgical changes about the left globe. Clear paranasal sinuses and mastoid air cells. Other: None. IMPRESSION: 1.  No acute intracranial abnormality. 2.  Cerebral atrophy and small vessel ischemic change. Electronically Signed   By: Abigail Miyamoto M.D.   On: 10/20/2017 14:44   Ct Cervical Spine Wo Contrast  Result Date: 10/20/2017 CLINICAL DATA:  Syncopal episode this morning striking head. Posterior neck pain. EXAM: CT CERVICAL SPINE WITHOUT CONTRAST TECHNIQUE: Multidetector CT imaging of the cervical spine was performed without intravenous contrast. Multiplanar CT image reconstructions were also generated. COMPARISON:  MRI brain 08/13/2016 FINDINGS: Alignment: Within normal. Skull base and vertebrae: Vertebral body heights are normal. There is mild spondylosis throughout the cervical spine. Subtle patchy lucency and sclerosis along the posterior aspect of the vertebral bodies from C2 to T1. Atlantoaxial articulation is normal. There is uncovertebral joint spurring and facet arthropathy. No acute fracture or subluxation. Mild bilateral neural foraminal narrowing at the C3-4  and C4-5 levels. Mild left-sided neural foraminal narrowing at the C5-6 level. Soft tissues and spinal canal: No prevertebral fluid or swelling. No visible canal hematoma. Disc levels: Disc space narrowing at the C3-4, C4-5 and C5-6 levels. Upper chest: Within normal. Other: Stable 4.2 cm oval well-defined cystic structure within the subcutaneous fat of the midline lower axillary region likely epidermal inclusion cyst. IMPRESSION: No acute injury. Mild spondylosis throughout the cervical spine with multilevel disc space narrowing and multilevel neural foraminal narrowing as described. Electronically Signed   By: Marin Olp M.D.   On: 10/20/2017 12:58  Procedures Procedures (including critical care time)  Medications Ordered in ED Medications  cefTRIAXone (ROCEPHIN) 1 g in sodium chloride 0.9 % 100 mL IVPB (0 g Intravenous Stopped 10/20/17 1543)  furosemide (LASIX) tablet 40 mg (40 mg Oral Given 10/20/17 1548)     Initial Impression / Assessment and Plan / ED Course  I have reviewed the triage vital signs and the nursing notes.  Pertinent labs & imaging results that were available during my care of the patient were reviewed by me and considered in my medical decision making (see chart for details).  Clinical Course as of Oct 20 1304  Sun Oct 20, 2017  1303 Patient had a witnessed syncopal event while standing in line at [employer].  He does have a small bump in the back of his head.  He feels generally fatigued.  He has been getting more aggressively diuresed by his doctors for peripheral edema.  Differential diagnosis includes overdiuresis with hypotension, cardiac arrhythmia, bleed,   [MB]    Clinical Course User Index [MB] Hayden Rasmussen, MD     Final Clinical Impressions(s) / ED Diagnoses   Final diagnoses:  Syncope and collapse  Lower urinary tract infectious disease    ED Discharge Orders         Ordered    cephALEXin (KEFLEX) 250 MG capsule  4 times daily     10/20/17  1615           Hayden Rasmussen, MD 10/20/17 1733

## 2017-10-20 NOTE — ED Notes (Signed)
CBG obtained (181mg /dL)

## 2017-10-21 ENCOUNTER — Other Ambulatory Visit: Payer: Self-pay | Admitting: *Deleted

## 2017-10-21 LAB — CBG MONITORING, ED: GLUCOSE-CAPILLARY: 181 mg/dL — AB (ref 70–99)

## 2017-10-21 NOTE — Patient Outreach (Signed)
[facility]) Care Management  10/21/2017  BAXTER GONZALEZ [birth year] [id number]   EMMI-  CHF      RED ON EMMI ALERT Day # 43 Date: 10/19/17 Red Alert Reason: Filled new Rx?? No    Outreach attempt # 1 No answer. [facility] RN CM left HIPAA compliant voicemail message along with CM's contact info.    Plan: [facility] RN CM sent an unsuccessful outreach letter and scheduled this patient for another call attempt within 4 business days  Kimberly L. Lavina Hamman, RN, BSN, [place] Management Care Coordinator Direct Number [phone] Mobile number [phone]  Main [facility] number [phone] Fax number [phone]

## 2017-10-22 ENCOUNTER — Other Ambulatory Visit: Payer: Self-pay | Admitting: *Deleted

## 2017-10-22 NOTE — Patient Outreach (Signed)
[facility]) Care Management  10/22/2017  [name] 18-May-1940 [id number]   EMMI-              CHF                                          RED ON EMMI ALERT Day # 43 Date: 10/19/17 Red Alert Reason: Filled new Rx?? No    Outreach attempt # 2 No answer. [facility] RN CM left HIPAA compliant voicemail message along with CM's contact info at the number listed as both as his home and mobile number.   [facility] RN CM called and spoke with his daughter, [name] who states after his ED visit on 10/20/17 he stayed with her and has returned home on 10/22/17 She confirms the EMMI question about the rx is incorrect She confirms he did get his Rx filled and is doing much better [name] thanked [facility] RN CM for calling and checking on [name]    Plan: [facility] RN CM will close case at this time as patient has been assessed and no needs identified.     Abriana Saltos L. Lavina Hamman, RN, BSN, [place] Management Care Coordinator Direct Number [phone] Mobile number [phone]  Main [facility] number [phone] Fax number [phone]

## 2017-10-22 NOTE — Patient Outreach (Signed)
[facility]) Care Management  10/22/2017  Elijah White [birth year] [id number]   Care coordation  [name] returned a call to [facility] RN CM  He reports visual difficulty and not checking his mobile number voice messages often Patient is able to verify HIPAA Reviewed and addressed referral to [facility] with patient Updated him on the contact with [place] patient that there will be further automated EMMI- post discharge calls to assess how the patient is doing following the recent hospitalization Advised the patient that another call may be received from a nurse if any of their responses were abnormal. Patient voiced understanding and was appreciative of f/u call.   [name] L. Lavina Hamman, RN, BSN, [place] Coordinator Office number [phone] Mobile number [phone]  Main [facility] number [phone] Fax number [phone]

## 2017-11-11 DIAGNOSIS — H5461 Unqualified visual loss, right eye, normal vision left eye: Secondary | ICD-10-CM | POA: Insufficient documentation

## 2018-02-03 ENCOUNTER — Other Ambulatory Visit: Payer: Self-pay

## 2018-02-03 DIAGNOSIS — N185 Chronic kidney disease, stage 5: Secondary | ICD-10-CM

## 2018-02-13 ENCOUNTER — Other Ambulatory Visit: Payer: Self-pay | Admitting: Nurse Practitioner

## 2018-02-19 ENCOUNTER — Ambulatory Visit (INDEPENDENT_AMBULATORY_CARE_PROVIDER_SITE_OTHER): Payer: Medicare Other

## 2018-02-19 ENCOUNTER — Ambulatory Visit (INDEPENDENT_AMBULATORY_CARE_PROVIDER_SITE_OTHER): Payer: Medicare Other | Admitting: Internal Medicine

## 2018-02-19 VITALS — BP 170/82 | HR 72 | Temp 98.2°F | Ht 66.0 in | Wt 197.2 lb

## 2018-02-19 VITALS — BP 170/82 | HR 70 | Temp 98.2°F | Ht 66.0 in | Wt 197.2 lb

## 2018-02-19 DIAGNOSIS — R609 Edema, unspecified: Secondary | ICD-10-CM

## 2018-02-19 DIAGNOSIS — I10 Essential (primary) hypertension: Secondary | ICD-10-CM

## 2018-02-19 DIAGNOSIS — N184 Chronic kidney disease, stage 4 (severe): Secondary | ICD-10-CM

## 2018-02-19 DIAGNOSIS — E1165 Type 2 diabetes mellitus with hyperglycemia: Secondary | ICD-10-CM

## 2018-02-19 DIAGNOSIS — Z0000 Encounter for general adult medical examination without abnormal findings: Secondary | ICD-10-CM

## 2018-02-19 DIAGNOSIS — R9431 Abnormal electrocardiogram [ECG] [EKG]: Secondary | ICD-10-CM | POA: Diagnosis not present

## 2018-02-19 DIAGNOSIS — Z712 Person consulting for explanation of examination or test findings: Secondary | ICD-10-CM

## 2018-02-19 DIAGNOSIS — E1121 Type 2 diabetes mellitus with diabetic nephropathy: Secondary | ICD-10-CM | POA: Diagnosis not present

## 2018-02-19 DIAGNOSIS — I129 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 kidney disease, or unspecified chronic kidney disease: Secondary | ICD-10-CM

## 2018-02-19 LAB — CBC
HEMOGLOBIN: 12.7 g/dL — AB (ref 13.0–17.7)
Hematocrit: 38.7 % (ref 37.5–51.0)
MCH: 31.1 pg (ref 26.6–33.0)
MCHC: 32.8 g/dL (ref 31.5–35.7)
MCV: 95 fL (ref 79–97)
PLATELETS: 230 10*3/uL (ref 150–450)
RBC: 4.09 x10E6/uL — ABNORMAL LOW (ref 4.14–5.80)
RDW: 13.6 % (ref 12.3–15.4)
WBC: 6 10*3/uL (ref 3.4–10.8)

## 2018-02-19 LAB — CMP14 + ANION GAP
A/G RATIO: 1.6 (ref 1.2–2.2)
ALT: 13 IU/L (ref 0–44)
AST: 21 IU/L (ref 0–40)
Albumin: 4.2 g/dL (ref 3.5–4.8)
Alkaline Phosphatase: 126 IU/L — ABNORMAL HIGH (ref 39–117)
Anion Gap: 19 mmol/L — ABNORMAL HIGH (ref 10.0–18.0)
BILIRUBIN TOTAL: 0.5 mg/dL (ref 0.0–1.2)
BUN/Creatinine Ratio: 9 — ABNORMAL LOW (ref 10–24)
BUN: 39 mg/dL — AB (ref 8–27)
CHLORIDE: 107 mmol/L — AB (ref 96–106)
CO2: 18 mmol/L — ABNORMAL LOW (ref 20–29)
Calcium: 9.3 mg/dL (ref 8.6–10.2)
Creatinine, Ser: 4.28 mg/dL — ABNORMAL HIGH (ref 0.76–1.27)
GFR calc non Af Amer: 12 mL/min/{1.73_m2} — ABNORMAL LOW (ref >59)
GFR, EST AFRICAN AMERICAN: 14 mL/min/{1.73_m2} — AB (ref >59)
GLOBULIN, TOTAL: 2.7 g/dL (ref 1.5–4.5)
GLUCOSE: 74 mg/dL (ref 65–99)
POTASSIUM: 4 mmol/L (ref 3.5–5.2)
SODIUM: 144 mmol/L (ref 134–144)
Total Protein: 6.9 g/dL (ref 6.0–8.5)

## 2018-02-19 LAB — POCT URINALYSIS DIPSTICK
BILIRUBIN UA: NEGATIVE
GLUCOSE UA: POSITIVE — AB
KETONES UA: NEGATIVE
Leukocytes, UA: NEGATIVE
Nitrite, UA: NEGATIVE
PH UA: 6 (ref 5.0–8.0)
Protein, UA: POSITIVE — AB
Spec Grav, UA: 1.02 (ref 1.010–1.025)
UROBILINOGEN UA: 0.2 U/dL

## 2018-02-19 LAB — HEMOGLOBIN A1C
ESTIMATED AVERAGE GLUCOSE: 140 mg/dL
Hgb A1c MFr Bld: 6.5 % — ABNORMAL HIGH (ref 4.8–5.6)

## 2018-02-19 LAB — POCT UA - MICROALBUMIN
Albumin/Creatinine Ratio, Urine, POC: >300
CREATININE, POC: 200 mg/dL
MICROALBUMIN (UR) POC: 150 mg/L

## 2018-02-19 NOTE — Patient Instructions (Signed)
Mr. Elijah White , Thank you for taking time to come for your Medicare Wellness Visit. I appreciate your ongoing commitment to your health goals. Please review the following plan we discussed and let me know if I can assist you in the future.   Screening recommendations/referrals: Colonoscopy: not required Recommended yearly ophthalmology/optometry visit for glaucoma screening and checkup Recommended yearly dental visit for hygiene and checkup  Vaccinations: Influenza vaccine: 12/2017 Pneumococcal vaccine: 08/2012 Tdap vaccine: 10/2012 Shingles vaccine:  dec    Advanced directives: Advance directive discussed with you today. I have provided a copy for you to complete at home and have notarized. Once this is complete please bring a copy in to our office so we can scan it into your chart.   Conditions/risks identified: smoking and obesity  Next appointment: 02/19/2018 Exercise for Older Adults Staying physically active is important as you age. The four types of exercises that are best for older adults are endurance, strength, balance, and flexibility. Contact your health care provider before you start any exercise routine. Ask your health care provider what activities are safe for you. What are the risks? Risks associated with exercising include:  Overdoing it. This may lead to sore muscles or fatigue.  Falls.  Injuries.  Dehydration. How to do these exercises Endurance exercises Endurance (aerobic) exercises raise your breathing rate and heart rate. Increasing your endurance helps you to do everyday tasks and stay healthy. By improving the health of your body system that includes your heart, lungs, and blood vessels (circulatory system), you may also delay or prevent diseases such as heart disease, diabetes, and bone loss (osteoporosis). Types of endurance exercises include:  Sports.  Indoor activities, such as using gym equipment, doing water aerobics, or dancing.  Outdoor activities,  such as biking or jogging.  Tasks around the house, such as gardening, yard work, and heavy household chores like cleaning.  Walking, such as hiking or walking around your neighborhood. When doing endurance exercises, make sure you:  Are aware of your surroundings.  Use safety equipment as directed.  Dress in layers when exercising outdoors.  Drink plenty of water to stay well hydrated. Build up endurance slowly. Start with 10 minutes at a time, and gradually build up to doing 30 minutes at a time. Unless your health care provider gave you different instructions, aim to exercise for a total of 150 minutes a week. Spread out that time so you are working on endurance on 3 or more days a week. Strength exercises Lifting, pulling, or pushing weights helps to strengthen muscles. Having stronger muscles makes it easier to do everyday activities, such as getting up from a chair, climbing stairs, carrying groceries, and playing with grandchildren. Strength exercises include arm and leg exercises that may be done:  With weights.  Without weights (using your own body weight).  With a resistance band. When doing strength exercises:  Move smoothly and steadily. Do not suddenly thrust or jerk the weights, the resistance band, or your body.  Start with no weights or with light weights, and gradually add more weight over time. Eventually, aim to use weights that are hard or very hard for you to lift. This means that you are able to do 8 repetitions with the weight, and the last few repetitions are very challenging.  Lift or push weights into position for 3 seconds, hold the position for 1 second, and then take 3 seconds to return to your starting position.  Breathe out (exhale) during difficult movements, like  lifting or pushing weights. Breathe in (inhale) to relax your muscles before the next repetition.  Consider alternating arms or legs, especially when you first start strength  exercises.  Expect some slight muscle soreness after each session. Do strength exercises on 2 or more days a week, for 30 minutes at a time. Avoid exercising the same muscle groups two days in a row. For example, if you work on your leg muscles one day, work on your arm muscles the next day. When you can do two sets of 10-15 repetitions with a certain weight, increase the amount of weight. Balance Balance exercises can help to prevent falls. Balance exercises include:  Standing on one foot.  Heel-to-toe walk.  Balance walk.  Tai chi. Make sure you have something sturdy to hold onto while doing balance exercises, such as a sturdy chair. As your balance improves, challenge yourself by holding onto the chair with one hand instead of two, and then with no hands. Trying exercises with your eyes closed also challenges your balance, but be sure to have a sturdy surface (like a countertop) close by in case you need it. Do balance exercises as often as you want, or as often as directed by your health care provider. Strength exercises for the lower body also help to improve balance. Flexibility Flexibility exercises improve how far you can bend, straighten, move, or rotate parts of your body (range of motion). These exercises also help you to do everyday activities such as getting dressed or reaching for objects. Flexibility exercises include stretching different parts of the body, and they may be done in a standing or seated position or on the floor. When stretching, make sure you:  Keep a slight bend in your arms and legs. Avoid completely straightening ("locking") your joints.  Do not stretch so far that you feel pain. You should feel a mild stretching feeling. You may try stretching farther as you become more flexible over time.  Relax and breathe between stretches.  Hold onto something sturdy for balance as needed. Hold each stretch for 10-30 seconds. Repeat each stretch 3-5 times. General  safety tips  Exercise in well-lit areas.  Do not hold your breath during exercises or stretches.  Warm up before exercising, and cool down after exercising. This can help prevent injury.  Drink plenty of water during exercise or any activity that makes you sweat.  Use smooth, steady movements. Do not use sudden, jerking movements, especially when lifting weights or doing flexibility exercises.  If you are not sure if an exercise is safe for you, or you are not sure how to do an exercise, talk with your health care provider. This is especially important if you have had surgery on muscles, bones, or joints (orthopedic surgery). Where to find more information You can find more information about exercise for older adults from:  Your local health department, fitness center, or community center. These facilities may have programs for aging adults.  [employer] on Aging: http://kim-miller.com/  National Council on Aging: www.ncoa.org Summary  Staying physically active is important as you age.  Make sure to contact your health care provider before you start any exercise routine. Ask your health care provider what activities are safe for you.  Doing endurance, strength, balance, and flexibility exercises can help to delay or prevent certain diseases, such as heart disease, diabetes, and bone loss (osteoporosis). This information is not intended to replace advice given to you by your health care provider. Make sure you discuss  any questions you have with your health care provider. Document Released: 07/11/2016 Document Revised: 07/11/2016 Document Reviewed: 07/11/2016 Elsevier Interactive Patient Education  2019 [place] 65 Years and Older, Male Preventive care refers to lifestyle choices and visits with your health care provider that can promote health and wellness. What does preventive care include?  A yearly physical exam. This is also called an annual well  check.  Dental exams once or twice a year.  Routine eye exams. Ask your health care provider how often you should have your eyes checked.  Personal lifestyle choices, including:  Daily care of your teeth and gums.  Regular physical activity.  Eating a healthy diet.  Avoiding tobacco and drug use.  Limiting alcohol use.  Practicing safe sex.  Taking low doses of aspirin every day.  Taking vitamin and mineral supplements as recommended by your health care provider. What happens during an annual well check? The services and screenings done by your health care provider during your annual well check will depend on your age, overall health, lifestyle risk factors, and family history of disease. Counseling  Your health care provider may ask you questions about your:  Alcohol use.  Tobacco use.  Drug use.  Emotional well-being.  Home and relationship well-being.  Sexual activity.  Eating habits.  History of falls.  Memory and ability to understand (cognition).  Work and work Statistician. Screening  You may have the following tests or measurements:  Height, weight, and BMI.  Blood pressure.  Lipid and cholesterol levels. These may be checked every 5 years, or more frequently if you are over 5 years old.  Skin check.  Lung cancer screening. You may have this screening every year starting at age 51 if you have a 30-pack-year history of smoking and currently smoke or have quit within the past 15 years.  Fecal occult blood test (FOBT) of the stool. You may have this test every year starting at age 64.  Flexible sigmoidoscopy or colonoscopy. You may have a sigmoidoscopy every 5 years or a colonoscopy every 10 years starting at age 28.  Prostate cancer screening. Recommendations will vary depending on your family history and other risks.  Hepatitis C blood test.  Hepatitis B blood test.  Sexually transmitted disease (STD) testing.  Diabetes screening. This is  done by checking your blood sugar (glucose) after you have not eaten for a while (fasting). You may have this done every 1-3 years.  Abdominal aortic aneurysm (AAA) screening. You may need this if you are a current or former smoker.  Osteoporosis. You may be screened starting at age 56 if you are at high risk. Talk with your health care provider about your test results, treatment options, and if necessary, the need for more tests. Vaccines  Your health care provider may recommend certain vaccines, such as:  Influenza vaccine. This is recommended every year.  Tetanus, diphtheria, and acellular pertussis (Tdap, Td) vaccine. You may need a Td booster every 10 years.  Zoster vaccine. You may need this after age 18.  Pneumococcal 13-valent conjugate (PCV13) vaccine. One dose is recommended after age 25.  Pneumococcal polysaccharide (PPSV23) vaccine. One dose is recommended after age 55. Talk to your health care provider about which screenings and vaccines you need and how often you need them. This information is not intended to replace advice given to you by your health care provider. Make sure you discuss any questions you have with your health care provider. Document  Released: 03/18/2015 Document Revised: 11/09/2015 Document Reviewed: 12/21/2014 Elsevier Interactive Patient Education  2017 [place] Prevention in the Home Falls can cause injuries. They can happen to people of all ages. There are many things you can do to make your home safe and to help prevent falls. What can I do on the outside of my home?  Regularly fix the edges of walkways and driveways and fix any cracks.  Remove anything that might make you trip as you walk through a door, such as a raised step or threshold.  Trim any bushes or trees on the path to your home.  Use bright outdoor lighting.  Clear any walking paths of anything that might make someone trip, such as rocks or tools.  Regularly check to  see if handrails are loose or broken. Make sure that both sides of any steps have handrails.  Any raised decks and porches should have guardrails on the edges.  Have any leaves, snow, or ice cleared regularly.  Use sand or salt on walking paths during winter.  Clean up any spills in your garage right away. This includes oil or grease spills. What can I do in the bathroom?  Use night lights.  Install grab bars by the toilet and in the tub and shower. Do not use towel bars as grab bars.  Use non-skid mats or decals in the tub or shower.  If you need to sit down in the shower, use a plastic, non-slip stool.  Keep the floor dry. Clean up any water that spills on the floor as soon as it happens.  Remove soap buildup in the tub or shower regularly.  Attach bath mats securely with double-sided non-slip rug tape.  Do not have throw rugs and other things on the floor that can make you trip. What can I do in the bedroom?  Use night lights.  Make sure that you have a light by your bed that is easy to reach.  Do not use any sheets or blankets that are too big for your bed. They should not hang down onto the floor.  Have a firm chair that has side arms. You can use this for support while you get dressed.  Do not have throw rugs and other things on the floor that can make you trip. What can I do in the kitchen?  Clean up any spills right away.  Avoid walking on wet floors.  Keep items that you use a lot in easy-to-reach places.  If you need to reach something above you, use a strong step stool that has a grab bar.  Keep electrical cords out of the way.  Do not use floor polish or wax that makes floors slippery. If you must use wax, use non-skid floor wax.  Do not have throw rugs and other things on the floor that can make you trip. What can I do with my stairs?  Do not leave any items on the stairs.  Make sure that there are handrails on both sides of the stairs and use  them. Fix handrails that are broken or loose. Make sure that handrails are as long as the stairways.  Check any carpeting to make sure that it is firmly attached to the stairs. Fix any carpet that is loose or worn.  Avoid having throw rugs at the top or bottom of the stairs. If you do have throw rugs, attach them to the floor with carpet tape.  Make sure that you have  a light switch at the top of the stairs and the bottom of the stairs. If you do not have them, ask someone to add them for you. What else can I do to help prevent falls?  Wear shoes that:  Do not have high heels.  Have rubber bottoms.  Are comfortable and fit you well.  Are closed at the toe. Do not wear sandals.  If you use a stepladder:  Make sure that it is fully opened. Do not climb a closed stepladder.  Make sure that both sides of the stepladder are locked into place.  Ask someone to hold it for you, if possible.  Clearly mark and make sure that you can see:  Any grab bars or handrails.  First and last steps.  Where the edge of each step is.  Use tools that help you move around (mobility aids) if they are needed. These include:  Canes.  Walkers.  Scooters.  Crutches.  Turn on the lights when you go into a dark area. Replace any light bulbs as soon as they burn out.  Set up your furniture so you have a clear path. Avoid moving your furniture around.  If any of your floors are uneven, fix them.  If there are any pets around you, be aware of where they are.  Review your medicines with your doctor. Some medicines can make you feel dizzy. This can increase your chance of falling. Ask your doctor what other things that you can do to help prevent falls. This information is not intended to replace advice given to you by your health care provider. Make sure you discuss any questions you have with your health care provider. Document Released: 12/16/2008 Document Revised: 07/28/2015 Document Reviewed:  03/26/2014 Elsevier Interactive Patient Education  2017 [employer]    Smoking Tobacco Information, Adult Smoking tobacco can be harmful to your health. Tobacco contains a poisonous (toxic), colorless chemical called nicotine. Nicotine is addictive. It changes the brain and can make it hard to stop smoking. Tobacco also has other toxic chemicals that can hurt your body and raise your risk of many cancers. How can smoking tobacco affect me? Smoking tobacco puts you at risk for:  Cancer. Smoking is most commonly associated with lung cancer, but can also lead to cancer in other parts of the body.  Chronic obstructive pulmonary disease (COPD). This is a long-term lung condition that makes it hard to breathe. It also gets worse over time.  High blood pressure (hypertension), heart disease, stroke, or heart attack.  Lung infections, such as pneumonia.  Cataracts. This is when the lenses in the eyes become clouded.  Digestive problems. This may include peptic ulcers, heartburn, and gastroesophageal reflux disease (GERD).  Oral health problems, such as gum disease and tooth loss.  Loss of taste and smell. Smoking can affect your appearance by causing:  Wrinkles.  Yellow or stained teeth, fingers, and fingernails. Smoking tobacco can also affect your social life, because:  It may be challenging to find places to smoke when away from home. Many workplaces, [employer], hotels, and public places are tobacco-free.  Smoking is expensive. This is due to the cost of tobacco and the long-term costs of treating health problems from smoking.  Secondhand smoke may affect those around you. Secondhand smoke can cause lung cancer, breathing problems, and heart disease. Children of smokers have a higher risk for: ? Sudden infant death syndrome (SIDS). ? Ear infections. ? Lung infections. If you currently smoke tobacco, quitting  now can help you:  Lead a longer and healthier life.  Look,  smell, breathe, and feel better over time.  Save money.  Protect others from the harms of secondhand smoke. What actions can I take to prevent health problems? Quit smoking   Do not start smoking. Quit if you already do.  Make a plan to quit smoking and commit to it. Look for programs to help you and ask your health care provider for recommendations and ideas.  Set a date and write down all the reasons you want to quit.  Let your friends and family know you are quitting so they can help and support you. Consider finding friends who also want to quit. It can be easier to quit with someone else, so that you can support each other.  Talk with your health care provider about using nicotine replacement medicines to help you quit, such as gum, lozenges, patches, sprays, or pills.  Do not replace cigarette smoking with electronic cigarettes, which are commonly called e-cigarettes. The safety of e-cigarettes is not known, and some may contain harmful chemicals.  If you try to quit but return to smoking, stay positive. It is common to slip up when you first quit, so take it one day at a time.  Be prepared for cravings. When you feel the urge to smoke, chew gum or suck on hard candy. Lifestyle  Stay busy and take care of your body.  Drink enough fluid to keep your urine pale yellow.  Get plenty of exercise and eat a healthy diet. This can help prevent weight gain after quitting.  Monitor your eating habits. Quitting smoking can cause you to have a larger appetite than when you smoke.  Find ways to relax. Go out with friends or family to a movie or a restaurant where people do not smoke.  Ask your health care provider about having regular tests (screenings) to check for cancer. This may include blood tests, imaging tests, and other tests.  Find ways to manage your stress, such as meditation, yoga, or exercise. Where to find support To get support to quit smoking, consider:  Asking your  health care provider for more information and resources.  Taking classes to learn more about quitting smoking.  Looking for local organizations that offer resources about quitting smoking.  Joining a support group for people who want to quit smoking in your local community.  Calling the smokefree.gov counselor helpline: 1-800-Quit-Now 334-319-6041) Where to find more information You may find more information about quitting smoking from:  HelpGuide.org: www.helpguide.org  https://hall.com/: smokefree.gov  American Lung Association: www.lung.org Contact a health care provider if you:  Have problems breathing.  Notice that your lips, nose, or fingers turn blue.  Have chest pain.  Are coughing up blood.  Feel faint or you pass out.  Have other health changes that cause you to worry. Summary  Smoking tobacco can negatively affect your health, the health of those around you, your finances, and your social life.  Do not start smoking. Quit if you already do. If you need help quitting, ask your health care provider.  Think about joining a support group for people who want to quit smoking in your local community. There are many effective programs that will help you to quit this behavior. This information is not intended to replace advice given to you by your health care provider. Make sure you discuss any questions you have with your health care provider. Document Released: 03/06/2016 Document Revised: 04/10/2017  Document Reviewed: 03/06/2016 Elsevier Interactive Patient Education  [employer].

## 2018-02-19 NOTE — Progress Notes (Signed)
Subjective:   Elijah White is a 77 y.o. male who presents for Medicare Annual/Subsequent preventive examination.  Review of Systems:  n/a Cardiac Risk Factors include: advanced age (>65men, >2 women);diabetes mellitus;hypertension;obesity (BMI >30kg/m2);male gender;sedentary lifestyle;smoking/ tobacco exposure     Objective:    Vitals: BP (!) 170/82 (BP Location: Left Arm, Patient Position: Sitting)   Pulse 72   Temp 98.2 F (36.8 C) (Oral)   Ht 5\' 6"  (1.676 m)   Wt 197 lb 3.2 oz (89.4 kg)   SpO2 98%   BMI 31.83 kg/m   Body mass index is 31.83 kg/m.  Advanced Directives 02/19/2018 10/22/2017 09/02/2017 08/13/2016  Does Patient Have a Medical Advance Directive? No No No No  Would patient like information on creating a medical advance directive? Yes (MAU/Ambulatory/Procedural Areas - Information given) No - Patient declined No - Patient declined No - Patient declined    Tobacco Social History   Tobacco Use  Smoking Status Current Every Day Smoker  . Packs/day: 0.50  Smokeless Tobacco Never Used     Ready to quit: Yes Counseling given: Yes   Clinical Intake:  Pre-visit preparation completed: Yes  Pain : No/denies pain Pain Score: 0-No pain     Nutritional Status: BMI > 30  Obese Nutritional Risks: None Diabetes: Yes CBG done?: No Did pt. bring in CBG monitor from home?: No  How often do you need to have someone help you when you read instructions, pamphlets, or other written materials from your doctor or pharmacy?: 1 - Never What is the last grade level you completed in school?: 12th grade     Information entered by :: NAllen LPN  Past Medical History:  Diagnosis Date  . Diabetes mellitus without complication ([place])   . Elevated cholesterol with high triglycerides   . Hypertension    Past Surgical History:  Procedure Laterality Date  . PROSTATE SURGERY     Family History  Problem Relation Age of Onset  . CAD Mother    Social History    Socioeconomic History  . Marital status: Widowed    Spouse name: Not on file  . Number of children: Not on file  . Years of education: Not on file  . Highest education level: Not on file  Occupational History  . Occupation: retired  Scientific laboratory technician  . Financial resource strain: Not hard at all  . Food insecurity:    Worry: Never true    Inability: Never true  . Transportation needs:    Medical: No    Non-medical: No  Tobacco Use  . Smoking status: Current Every Day Smoker    Packs/day: 0.50  . Smokeless tobacco: Never Used  Substance and Sexual Activity  . Alcohol use: No  . Drug use: No  . Sexual activity: Not Currently  Lifestyle  . Physical activity:    Days per week: 0 days    Minutes per session: 0 min  . Stress: Not at all  Relationships  . Social connections:    Talks on phone: Not on file    Gets together: Not on file    Attends religious service: Not on file    Active member of club or organization: Not on file    Attends meetings of clubs or organizations: Not on file    Relationship status: Not on file  Other Topics Concern  . Not on file  Social History Narrative  . Not on file    Outpatient Encounter Medications as of 02/19/2018  Medication Sig  . acetaminophen (TYLENOL) 500 MG tablet Take 1,000 mg by mouth every 6 (six) hours as needed for headache (pain).  [name] amLODipine (NORVASC) 10 MG tablet Take 0.5 tablets (5 mg total) by mouth daily. (Patient taking differently: Take 10 mg by mouth daily. )  . atorvastatin (LIPITOR) 10 MG tablet Take 2 tablets (20 mg total) by mouth daily. (Patient taking differently: Take 10 mg by mouth daily. )  . bimatoprost (LUMIGAN) 0.01 % SOLN Place 1 drop into the right eye at bedtime.  . brimonidine (ALPHAGAN) 0.2 % ophthalmic solution Place 1 drop into the right eye 2 (two) times daily.  . calcitRIOL (ROCALTROL) 0.25 MCG capsule Take 0.25 mcg by mouth daily.  [name] esomeprazole (NEXIUM) 40 MG capsule TAKE 1 CAPSULE BY MOUTH  ONCE DAILY  . gabapentin (NEURONTIN) 100 MG capsule Take 100 mg by mouth at bedtime.  . pilocarpine (PILOCAR) 4 % ophthalmic solution Place 1 drop into the right eye 3 (three) times daily.  . prednisoLONE acetate (PRED FORTE) 1 % ophthalmic suspension Place 1 drop into the left eye.   [name] atropine 1 % ophthalmic solution Place 1 drop into the left eye 2 (two) times daily.  . cephALEXin (KEFLEX) 250 MG capsule Take 1 capsule (250 mg total) by mouth 4 (four) times daily. (Patient not taking: Reported on 02/19/2018)  . dorzolamide-timolol (COSOPT) 22.3-6.8 MG/ML ophthalmic solution Place 1 drop into the right eye 2 (two) times daily.  . furosemide (LASIX) 40 MG tablet Take 2 tablets (80 mg total) by mouth daily.  [name] glipiZIDE (GLUCOTROL) 5 MG tablet Take 5 mg by mouth 2 (two) times daily before a meal.  . potassium chloride (K-DUR) 10 MEQ tablet Take 2 tablets (20 mEq total) by mouth daily for 14 days. (follow up for repeat labs within 1 week to determine long term plan/adjustments) (Patient not taking: Reported on 10/20/2017)   No facility-administered encounter medications on file as of 02/19/2018.     Activities of Daily Living In your present state of health, do you have any difficulty performing the following activities: 02/19/2018 09/02/2017  Hearing? N N  Vision? Y N  Comment sight not well with glasses -  Difficulty concentrating or making decisions? N N  Walking or climbing stairs? Tempie Donning  Comment needs to hold on to a rail. Uses walker -  Dressing or bathing? N N  Doing errands, shopping? (No Data) Y  Comment daughter accompanies -  Conservation officer, nature and eating ? N -  Using the Toilet? N -  In the past six months, have you accidently leaked urine? Y -  Comment some incontinence -  Do you have problems with loss of bowel control? N -  Managing your Medications? N -  Managing your Finances? N -  Housekeeping or managing your Housekeeping? N -  Some recent data might be hidden    Patient  Care Team: [name], MD as PCP - General (Internal Medicine)   Assessment:   This is a routine wellness examination for [facility].  Exercise Activities and Dietary recommendations Current Exercise Habits: The patient does not participate in regular exercise at present, Exercise limited by: orthopedic condition(s)(uses walker)  Goals   None     Fall Risk Fall Risk  02/19/2018  Falls in the past year? 0  Risk for fall due to : Impaired balance/gait;Impaired mobility  Risk for fall due to: Comment uses a walker   Is the patient's home free of loose throw rugs  in walkways, pet beds, electrical cords, etc?   yes      Grab bars in the bathroom? yes      Handrails on the stairs?   n/a      Adequate lighting?   yes  Timed Get Up and Go Performed: n/a  Depression Screen PHQ 2/9 Scores 02/19/2018 10/22/2017  PHQ - 2 Score 0 0  PHQ- 9 Score 0 -    Cognitive Function        Immunization History  Administered Date(s) Administered  . Influenza Whole 01/04/2000  . Influenza-Unspecified 12/26/2017    Qualifies for Shingles Vaccine?  yes  Screening Tests Health Maintenance  Topic Date Due  . FOOT EXAM  07/18/1948  . OPHTHALMOLOGY EXAM  07/18/1948  . URINE MICROALBUMIN  07/18/1948  . PNA vac Low Risk Adult (1 of 2 - PCV13) 07/19/2003  . HEMOGLOBIN A1C  03/05/2018  . TETANUS/TDAP  11/01/2022  . INFLUENZA VACCINE  Completed   Cancer Screenings: Lung: Low Dose CT Chest recommended if Age 63-80 years, 30 pack-year currently smoking OR have quit w/in 15years. Patient does qualify. Colorectal: not required  Additional Screenings:  Hepatitis C Screening:n/a      Plan:    Patient refused Cognitive test.  I have personally reviewed and noted the following in the patient's chart:   . Medical and social history . Use of alcohol, tobacco or illicit drugs  . Current medications and supplements . Functional ability and status . Nutritional status . Physical  activity . Advanced directives . List of other physicians . Hospitalizations, surgeries, and ER visits in previous 12 months . Vitals . Screenings to include cognitive, depression, and falls . Referrals and appointments  In addition, I have reviewed and discussed with patient certain preventive protocols, quality metrics, and best practice recommendations. A written personalized care plan for preventive services as well as general preventive health recommendations were provided to patient.     Kellie Simmering, LPN  [id number]

## 2018-02-19 NOTE — Progress Notes (Signed)
Subjective:     Patient ID: Elijah White , male    DOB: December 31, 1940 , 77 y.o.   MRN: [id number]   Chief Complaint  Patient presents with  . Diabetes    HPI  Pt is here for DM follow, his insurance did not approve Freestyle monitor. Has taken his meds this am. Had good sleep last night, but admits he fell asleep on his recliner and woke up at 2 am and then went back to bed til he got up to come to his apt. Today. He has not seen his renal Dr since Feb.this year.    Past Medical History:  Diagnosis Date  . Diabetes mellitus without complication ([place])   . Elevated cholesterol with high triglycerides   . Hypertension      Family History  Problem Relation Age of Onset  . CAD Mother      Current Outpatient Medications:  .  acetaminophen (TYLENOL) 500 MG tablet, Take 1,000 mg by mouth every 6 (six) hours as needed for headache (pain)., Disp: , Rfl:  .  amLODipine (NORVASC) 10 MG tablet, Take 0.5 tablets (5 mg total) by mouth daily. (Patient taking differently: Take 10 mg by mouth daily. ), Disp: , Rfl:  .  atorvastatin (LIPITOR) 10 MG tablet, Take 2 tablets (20 mg total) by mouth daily. (Patient taking differently: Take 10 mg by mouth daily. ), Disp: 30 tablet, Rfl: 0 .  atropine 1 % ophthalmic solution, Place 1 drop into the left eye 2 (two) times daily., Disp: , Rfl: 11 .  bimatoprost (LUMIGAN) 0.01 % SOLN, Place 1 drop into the right eye at bedtime., Disp: , Rfl:  .  brimonidine (ALPHAGAN) 0.2 % ophthalmic solution, Place 1 drop into the right eye 2 (two) times daily., Disp: , Rfl: 11 .  calcitRIOL (ROCALTROL) 0.25 MCG capsule, Take 0.25 mcg by mouth daily., Disp: , Rfl:  .  cephALEXin (KEFLEX) 250 MG capsule, Take 1 capsule (250 mg total) by mouth 4 (four) times daily. (Patient not taking: Reported on 02/19/2018), Disp: 28 capsule, Rfl: 0 .  dorzolamide-timolol (COSOPT) 22.3-6.8 MG/ML ophthalmic solution, Place 1 drop into the right eye 2 (two) times daily., Disp: , Rfl: 11 .   esomeprazole (NEXIUM) 40 MG capsule, TAKE 1 CAPSULE BY MOUTH ONCE DAILY, Disp: 30 capsule, Rfl: 0 .  furosemide (LASIX) 40 MG tablet, Take 2 tablets (80 mg total) by mouth daily., Disp: 60 tablet, Rfl: 0 .  gabapentin (NEURONTIN) 100 MG capsule, Take 100 mg by mouth at bedtime., Disp: , Rfl:  .  glipiZIDE (GLUCOTROL) 5 MG tablet, Take 5 mg by mouth 2 (two) times daily before a meal., Disp: , Rfl:  .  pilocarpine (PILOCAR) 4 % ophthalmic solution, Place 1 drop into the right eye 3 (three) times daily., Disp: , Rfl:  .  potassium chloride (K-DUR) 10 MEQ tablet, Take 2 tablets (20 mEq total) by mouth daily for 14 days. (follow up for repeat labs within 1 week to determine long term plan/adjustments) (Patient not taking: Reported on 10/20/2017), Disp: 28 tablet, Rfl: 0 .  prednisoLONE acetate (PRED FORTE) 1 % ophthalmic suspension, Place 1 drop into the left eye. , Disp: , Rfl: 11   No Known Allergies   Review of Systems  Constitutional: Negative for appetite change, chills, diaphoresis and fever.  Eyes: Positive for discharge and visual disturbance.       Both eyes tear a lot  Respiratory: Negative for cough, chest tightness and shortness of breath.  Cardiovascular: Negative for chest pain.  Gastrointestinal: Negative for abdominal distention, diarrhea and nausea.  Endocrine: Negative for polydipsia and polyphagia.  Genitourinary: Negative for dysuria, frequency and hematuria.  Musculoskeletal: Negative for gait problem.       Has chest wall  Skin: Positive for wound.       Has healing wound on R shin  Neurological: Negative for dizziness, facial asymmetry, speech difficulty and numbness.    EKG- shows  Today's Vitals   02/19/18 0924  BP: (!) 170/82  Pulse: 70  Temp: 98.2 F (36.8 C)  TempSrc: Oral  Weight: 197 lb 3.2 oz (89.4 kg)  Height: 5\' 6"  (1.676 m)   Body mass index is 31.83 kg/m.  BP- repeated 170/80 Objective:  Physical Exam   Constitutional: She is oriented to person,  place, and time. She appears well-developed and well-nourished. No distress.  HENT:  Head: Normocephalic and atraumatic.  Right Ear: External ear normal.  Left Ear: External ear normal.  Nose: Nose normal.  Eyes: Conjunctivae are normal. Right eye exhibits no discharge. Left eye exhibits no discharge. No scleral icterus.  Neck: Neck supple. No thyromegaly present.  No carotid bruits bilaterally  Cardiovascular: Normal rate and regular rhythm.  No murmur heard. Pulmonary/Chest: Effort normal and breath sounds normal. No respiratory distress.  Musculoskeletal: Normal range of motion. She exhibits +2/4 edema to upper shin. Has venostatisi changes on lower legs   Lymphadenopathy:    She has no cervical adenopathy.  Neurological: She is alert and oriented to person, place, and time.  Skin: Skin is warm and dry. Capillary refill takes less than 2 seconds. Has venostasis changes on his lower legs.  He is not diaphoretic.  Psychiatric: He has a normal mood and affect. His behavior is normal. Judgment and thought content normal.  Nursing note reviewed. EKG today shows incomplete RBBB which was not present on EKG from 10/2017.    Assessment And Plan:  1. 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 Essential hypertension- poorly controlled.   - CMP14 + Anion Gap - CBC no Diff - EKG 12-Lead I reviewed records from [place] and found notation from   08/2016 Creat of 2.5  3. Abnormal EKG-  I reviewed this with Dr Baird Cancer and she will review his records and recommend further treatment. I will call his daughter ones I hear back from Dr Baird Cancer recommendation since she is his PCP.   02/20/18- After reviewing his nephrology note from Feb this year( last note we have) and results from today, Dr Baird Cancer recommend for him to decrease the  Glipizide to qd instead of bid, and we will add Hydralazine 25 mg bid for BP. [name] asked to come next week with father for Ozempic education so she can  give him his injections weekly. I reviewed side effects of Ozempic and Hydralazine with her at her request.  She made apt 12/27 at 9 am Hannalee Castor RODRIGUEZ-SOUTHWORTH, PA-C

## 2018-02-19 NOTE — Addendum Note (Signed)
Addended by: Kellie Simmering on: 02/19/2018 04:47 PM   Modules accepted: Orders

## 2018-02-20 ENCOUNTER — Other Ambulatory Visit: Payer: Self-pay | Admitting: Internal Medicine

## 2018-02-20 ENCOUNTER — Telehealth: Payer: Self-pay

## 2018-02-20 ENCOUNTER — Encounter: Payer: Self-pay | Admitting: Internal Medicine

## 2018-02-20 DIAGNOSIS — E1165 Type 2 diabetes mellitus with hyperglycemia: Secondary | ICD-10-CM | POA: Insufficient documentation

## 2018-02-20 MED ORDER — HYDRALAZINE HCL 25 MG PO TABS: 25.0000 mg | ORAL_TABLET | Freq: Two times a day (BID) | ORAL | 0 refills | Status: DC

## 2018-02-20 MED ORDER — AMLODIPINE BESYLATE 10 MG PO TABS: 10.0000 mg | ORAL_TABLET | Freq: Every day | ORAL | Status: DC

## 2018-02-20 NOTE — Telephone Encounter (Signed)
Patients daughter would like to talk to you about patient's appointment yesterday he is having a hard time remembering what was said [phone] that is her phone number

## 2018-02-20 NOTE — Telephone Encounter (Signed)
I spoke with [name] and reviewed his labs with her, advised to decrease his Gliburide to 5 mg qd instead of bid, and come back next week for daughter to be educated on the use of Ozempic to give her father ones a week next week. He was also placed on Hydralazine 25 mg bid for his BP. Pt said he has apt with nephrologist on 04/01/18

## 2018-02-28 ENCOUNTER — Other Ambulatory Visit: Payer: Self-pay | Admitting: Nurse Practitioner

## 2018-03-03 ENCOUNTER — Encounter: Payer: Self-pay | Admitting: Vascular Surgery

## 2018-03-03 ENCOUNTER — Ambulatory Visit (INDEPENDENT_AMBULATORY_CARE_PROVIDER_SITE_OTHER)
Admission: RE | Admit: 2018-03-03 | Discharge: 2018-03-03 | Disposition: A | Discharge status: Home / Self Care | Payer: Medicare Other | Source: Ambulatory Visit | Attending: Vascular Surgery | Admitting: Vascular Surgery

## 2018-03-03 ENCOUNTER — Ambulatory Visit (INDEPENDENT_AMBULATORY_CARE_PROVIDER_SITE_OTHER): Payer: Medicare Other | Admitting: Vascular Surgery

## 2018-03-03 ENCOUNTER — Ambulatory Visit (HOSPITAL_COMMUNITY)
Admission: RE | Admit: 2018-03-03 | Discharge: 2018-03-03 | Disposition: A | Discharge status: Home / Self Care | Payer: Medicare Other | Source: Ambulatory Visit | Attending: Vascular Surgery | Admitting: Vascular Surgery

## 2018-03-03 ENCOUNTER — Other Ambulatory Visit: Payer: Self-pay

## 2018-03-03 VITALS — BP 163/82 | HR 77 | Resp 16 | Ht 66.0 in | Wt 200.0 lb

## 2018-03-03 DIAGNOSIS — N185 Chronic kidney disease, stage 5: Secondary | ICD-10-CM

## 2018-03-03 NOTE — Progress Notes (Signed)
Patient ID: Elijah White, male   DOB: 11/14/1940, 77 y.o.   MRN: [id number]  Reason for Consult: New Patient (Initial Visit) (eval for AVF access - Dr. Edrick Oh - No HD ever)   Referred by [name], MD  Subjective:     HPI:  Elijah White is a 77 y.o. male here for evaluation of dialysis access placement.  He has never had dialysis before.  He does have swelling of his bilateral upper extremities that is been ongoing.  He was admitted with fluid overload earlier this year.  He is right-hand dominant.  Denies having pacemaker defibrillator in the past.  Vein mapping and arterial evaluation performed prior to today's visit.  He has no complaints related to today's visit.  Past Medical History:  Diagnosis Date  . Chronic kidney disease   . Diabetes mellitus without complication ([place])   . Elevated cholesterol with high triglycerides   . Hypertension    Family History  Problem Relation Age of Onset  . CAD Mother    Past Surgical History:  Procedure Laterality Date  . PROSTATE SURGERY      Short Social History:  Social History   Tobacco Use  . Smoking status: Current Every Day Smoker    Packs/day: 0.50  . Smokeless tobacco: Never Used  Substance Use Topics  . Alcohol use: No    No Known Allergies  Current Outpatient Medications  Medication Sig Dispense Refill  . acetaminophen (TYLENOL) 500 MG tablet Take 1,000 mg by mouth every 6 (six) hours as needed for headache (pain).    [name] amLODipine (NORVASC) 10 MG tablet Take 1 tablet (10 mg total) by mouth daily.    [name] atorvastatin (LIPITOR) 10 MG tablet TAKE 1 TABLET BY MOUTH ONCE DAILY 90 tablet 0  . atropine 1 % ophthalmic solution Place 1 drop into the left eye 2 (two) times daily.  11  . bimatoprost (LUMIGAN) 0.01 % SOLN Place 1 drop into the right eye at bedtime.    . brimonidine (ALPHAGAN) 0.2 % ophthalmic solution Place 1 drop into the right eye 2 (two) times daily.  11  . calcitRIOL (ROCALTROL) 0.25 MCG  capsule Take 0.25 mcg by mouth daily.    . dorzolamide-timolol (COSOPT) 22.3-6.8 MG/ML ophthalmic solution Place 1 drop into the right eye 2 (two) times daily.  11  . esomeprazole (NEXIUM) 40 MG capsule TAKE 1 CAPSULE BY MOUTH ONCE DAILY 30 capsule 0  . gabapentin (NEURONTIN) 100 MG capsule Take 100 mg by mouth at bedtime.    [name] glipiZIDE (GLUCOTROL) 5 MG tablet Take 5 mg by mouth 2 (two) times daily before a meal.    . hydrALAZINE (APRESOLINE) 25 MG tablet TAKE 1 TABLET(25 MG) BY MOUTH TWICE DAILY 180 tablet 1  . pilocarpine (PILOCAR) 4 % ophthalmic solution Place 1 drop into the right eye 3 (three) times daily.    . prednisoLONE acetate (PRED FORTE) 1 % ophthalmic suspension Place 1 drop into the left eye.   11  . furosemide (LASIX) 40 MG tablet Take 2 tablets (80 mg total) by mouth daily. 60 tablet 0   No current facility-administered medications for this visit.     Review of Systems  Constitutional:  Constitutional negative. HENT: HENT negative.  Eyes: Positive for loss of vision.   Respiratory: Respiratory negative.  Cardiovascular: Positive for leg swelling.  GI: Gastrointestinal negative.  Musculoskeletal:       Hand swelling Skin: Skin negative.  Neurological: Neurological negative. Hematologic:  Hematologic/lymphatic negative.  Psychiatric: Psychiatric negative.        Objective:  Objective   Vitals:   03/03/18 0924  BP: (!) 163/82  Pulse: 77  Resp: 16  SpO2: 98%  Weight: 200 lb (90.7 kg)  Height: 5\' 6"  (1.676 m)   Body mass index is 32.28 kg/m.  Physical Exam HENT:     Head: Normocephalic.  Eyes:     Extraocular Movements: Extraocular movements intact.  Cardiovascular:     Pulses:          Radial pulses are 2+ on the right side and 2+ on the left side.     Comments: Bilateral brachial pulses easily palpable Pulmonary:     Effort: Pulmonary effort is normal.  Abdominal:     General: Abdomen is flat.  Musculoskeletal:        General: Swelling present.    Skin:    General: Skin is warm and dry.     Capillary Refill: Capillary refill takes less than 2 seconds.  Neurological:     General: No focal deficit present.     Mental Status: He is alert.  Psychiatric:        Mood and Affect: Mood normal.        Behavior: Behavior normal.     Data: I have independently interpreted his upper extremity arterial duplexes with brachial arteries that are triphasic and 0.7 cm bilaterally.  I have independently interpreted his upper extremity venous mapping which demonstrates marginal basilic and cephalic veins bilaterally for fistula creation     Assessment/Plan:     77 year old male with CKD 5 presents for evaluation of first dialysis access.  He is never had dialysis before.  We discussed the types of access for hemodialysis being catheter, fistula, graft with fistula being the preferred method if possible and in hope of avoiding a catheter.  He did have a wife on dialysis in the past via catheter and his daughter also has experience with dialysis as a Marine scientist.  I discussed the risk and benefits of proceeding with fistula versus graft and we would start with fistula in the left arm given that he is right-hand dominant with more swelling on the right.  This time he would like to defer until after his next nephrology appointment although we will discuss with the office to possibly move this up.  I discussed all the risk and benefits all of his questions were answered today.     [name] MD [facility]

## 2018-03-04 ENCOUNTER — Encounter: Payer: Self-pay | Admitting: Nephrology

## 2018-03-10 ENCOUNTER — Other Ambulatory Visit: Payer: Self-pay | Admitting: Internal Medicine

## 2018-03-12 ENCOUNTER — Other Ambulatory Visit: Payer: Self-pay | Admitting: *Deleted

## 2018-03-19 NOTE — Progress Notes (Signed)
Pre-op instructions provided only at the request of pt daughter, [name], [place] (was at an event). Please complete pt assessment on DOS. Daughter made aware to have pt stop taking vitamins, fish oil and herbal medications. Daughter made aware to have pt hold Glipizide at [facility] ( daughter to check to see if pt had taken already). Daughter stated that pt does not check blood glucose due to visual impairment. Daughter verbalized understanding of all pre-op instructions.

## 2018-03-20 ENCOUNTER — Ambulatory Visit (HOSPITAL_COMMUNITY): Payer: Medicare Other | Admitting: Anesthesiology

## 2018-03-20 ENCOUNTER — Ambulatory Visit (HOSPITAL_COMMUNITY)
Admission: RE | Admit: 2018-03-20 | Discharge: 2018-03-20 | Disposition: A | Discharge status: Home / Self Care | Payer: Medicare Other | Attending: Vascular Surgery | Admitting: Vascular Surgery

## 2018-03-20 ENCOUNTER — Encounter (HOSPITAL_COMMUNITY)
Admission: RE | Disposition: A | Discharge status: Home / Self Care | Payer: Self-pay | Source: Home / Self Care | Attending: Vascular Surgery

## 2018-03-20 ENCOUNTER — Encounter (HOSPITAL_COMMUNITY): Payer: Self-pay | Admitting: *Deleted

## 2018-03-20 ENCOUNTER — Other Ambulatory Visit: Payer: Self-pay

## 2018-03-20 DIAGNOSIS — Z79899 Other long term (current) drug therapy: Secondary | ICD-10-CM | POA: Insufficient documentation

## 2018-03-20 DIAGNOSIS — N185 Chronic kidney disease, stage 5: Secondary | ICD-10-CM | POA: Diagnosis not present

## 2018-03-20 DIAGNOSIS — Z7984 Long term (current) use of oral hypoglycemic drugs: Secondary | ICD-10-CM | POA: Diagnosis not present

## 2018-03-20 DIAGNOSIS — E1122 Type 2 diabetes mellitus with diabetic chronic kidney disease: Secondary | ICD-10-CM | POA: Insufficient documentation

## 2018-03-20 DIAGNOSIS — I120 Hypertensive chronic kidney disease with stage 5 chronic kidney disease or end stage renal disease: Secondary | ICD-10-CM | POA: Diagnosis present

## 2018-03-20 HISTORY — PX: AV FISTULA PLACEMENT: SHX1204

## 2018-03-20 LAB — GLUCOSE, CAPILLARY
GLUCOSE-CAPILLARY: 90 mg/dL (ref 70–99)
Glucose-Capillary: 95 mg/dL (ref 70–99)

## 2018-03-20 LAB — POCT I-STAT 4, (NA,K, GLUC, HGB,HCT)
Glucose, Bld: 98 mg/dL (ref 70–99)
HCT: 31 % — ABNORMAL LOW (ref 39.0–52.0)
Hemoglobin: 10.5 g/dL — ABNORMAL LOW (ref 13.0–17.0)
POTASSIUM: 4.3 mmol/L (ref 3.5–5.1)
Sodium: 143 mmol/L (ref 135–145)

## 2018-03-20 SURGERY — ARTERIOVENOUS (AV) FISTULA CREATION
Anesthesia: Monitor Anesthesia Care | Site: Arm Upper | Laterality: Left

## 2018-03-20 MED ORDER — ONDANSETRON HCL 4 MG/2ML IJ SOLN
INTRAMUSCULAR | Status: AC
  Filled 2018-03-20: qty 2

## 2018-03-20 MED ORDER — GLYCOPYRROLATE PF 0.2 MG/ML IJ SOSY
PREFILLED_SYRINGE | INTRAMUSCULAR | Status: AC
  Filled 2018-03-20: qty 1

## 2018-03-20 MED ORDER — HYDROCODONE-ACETAMINOPHEN 5-325 MG PO TABS: 1.0000 | ORAL_TABLET | Freq: Four times a day (QID) | ORAL | 0 refills | Status: DC | PRN

## 2018-03-20 MED ORDER — LIDOCAINE HCL (PF) 1 % IJ SOLN
INTRAMUSCULAR | Status: AC
  Filled 2018-03-20: qty 30

## 2018-03-20 MED ORDER — FENTANYL CITRATE (PF) 100 MCG/2ML IJ SOLN: 25.0000 ug | INTRAMUSCULAR | Status: DC | PRN

## 2018-03-20 MED ORDER — HYDROCODONE-ACETAMINOPHEN 5-325 MG PO TABS
ORAL_TABLET | ORAL | Status: AC
  Filled 2018-03-20: qty 1

## 2018-03-20 MED ORDER — LIDOCAINE HCL 1 % IJ SOLN
INTRAMUSCULAR | Status: DC | PRN
  Administered 2018-03-20: 15 mL

## 2018-03-20 MED ORDER — SODIUM CHLORIDE 0.9 % IV SOLN
INTRAVENOUS | Status: AC
  Filled 2018-03-20: qty 1.2

## 2018-03-20 MED ORDER — GLYCOPYRROLATE PF 0.2 MG/ML IJ SOSY
PREFILLED_SYRINGE | INTRAMUSCULAR | Status: DC | PRN
  Administered 2018-03-20: .1 mg via INTRAVENOUS

## 2018-03-20 MED ORDER — PROPOFOL 1000 MG/100ML IV EMUL
INTRAVENOUS | Status: AC
  Filled 2018-03-20: qty 100

## 2018-03-20 MED ORDER — CEFAZOLIN SODIUM-DEXTROSE 2-4 GM/100ML-% IV SOLN
2.0000 g | INTRAVENOUS | Status: AC
  Administered 2018-03-20: 2 g via INTRAVENOUS
  Filled 2018-03-20: qty 100

## 2018-03-20 MED ORDER — FENTANYL CITRATE (PF) 250 MCG/5ML IJ SOLN
INTRAMUSCULAR | Status: DC | PRN
  Administered 2018-03-20 (×2): 25 ug via INTRAVENOUS
  Administered 2018-03-20: 50 ug via INTRAVENOUS

## 2018-03-20 MED ORDER — ONDANSETRON HCL 4 MG/2ML IJ SOLN
INTRAMUSCULAR | Status: DC | PRN
  Administered 2018-03-20: 4 mg via INTRAVENOUS

## 2018-03-20 MED ORDER — HYDROCODONE-ACETAMINOPHEN 5-325 MG PO TABS
1.0000 | ORAL_TABLET | Freq: Once | ORAL | Status: AC
  Administered 2018-03-20: 1 via ORAL

## 2018-03-20 MED ORDER — SODIUM CHLORIDE 0.9 % IV SOLN
INTRAVENOUS | Status: DC | PRN
  Administered 2018-03-20: 500 mL

## 2018-03-20 MED ORDER — LIDOCAINE 2% (20 MG/ML) 5 ML SYRINGE
INTRAMUSCULAR | Status: AC
  Filled 2018-03-20: qty 5

## 2018-03-20 MED ORDER — LIDOCAINE 2% (20 MG/ML) 5 ML SYRINGE
INTRAMUSCULAR | Status: DC | PRN
  Administered 2018-03-20: 50 mg via INTRAVENOUS

## 2018-03-20 MED ORDER — SODIUM CHLORIDE 0.9 % IV SOLN
INTRAVENOUS | Status: DC
  Administered 2018-03-20: 09:00:00 via INTRAVENOUS

## 2018-03-20 MED ORDER — 0.9 % SODIUM CHLORIDE (POUR BTL) OPTIME
TOPICAL | Status: DC | PRN
  Administered 2018-03-20: 1000 mL

## 2018-03-20 MED ORDER — PROMETHAZINE HCL 25 MG/ML IJ SOLN: 6.2500 mg | INTRAMUSCULAR | Status: DC | PRN

## 2018-03-20 MED ORDER — PROPOFOL 500 MG/50ML IV EMUL
INTRAVENOUS | Status: DC | PRN
  Administered 2018-03-20: 75 ug/kg/min via INTRAVENOUS

## 2018-03-20 MED ORDER — FENTANYL CITRATE (PF) 250 MCG/5ML IJ SOLN
INTRAMUSCULAR | Status: AC
  Filled 2018-03-20: qty 5

## 2018-03-20 SURGICAL SUPPLY — 28 items
ARMBAND PINK RESTRICT EXTREMIT (MISCELLANEOUS) ×3 IMPLANT
CANISTER SUCT 3000ML PPV (MISCELLANEOUS) ×3 IMPLANT
CLIP VESOCCLUDE MED 6/CT (CLIP) ×3 IMPLANT
CLIP VESOCCLUDE SM WIDE 6/CT (CLIP) ×3 IMPLANT
COVER PROBE W GEL 5X96 (DRAPES) IMPLANT
COVER WAND RF STERILE (DRAPES) ×3 IMPLANT
DERMABOND ADVANCED (GAUZE/BANDAGES/DRESSINGS) ×2
DERMABOND ADVANCED .7 DNX12 (GAUZE/BANDAGES/DRESSINGS) ×1 IMPLANT
ELECT REM PT RETURN 9FT ADLT (ELECTROSURGICAL) ×3
ELECTRODE REM PT RTRN 9FT ADLT (ELECTROSURGICAL) ×1 IMPLANT
GLOVE BIO SURGEON STRL SZ7.5 (GLOVE) ×3 IMPLANT
GOWN STRL REUS W/ TWL LRG LVL3 (GOWN DISPOSABLE) ×2 IMPLANT
GOWN STRL REUS W/ TWL XL LVL3 (GOWN DISPOSABLE) ×1 IMPLANT
GOWN STRL REUS W/TWL LRG LVL3 (GOWN DISPOSABLE) ×4
GOWN STRL REUS W/TWL XL LVL3 (GOWN DISPOSABLE) ×2
INSERT FOGARTY SM (MISCELLANEOUS) IMPLANT
KIT BASIN OR (CUSTOM PROCEDURE TRAY) ×3 IMPLANT
KIT TURNOVER KIT B (KITS) ×3 IMPLANT
NS IRRIG 1000ML POUR BTL (IV SOLUTION) ×3 IMPLANT
PACK CV ACCESS (CUSTOM PROCEDURE TRAY) ×3 IMPLANT
PAD ARMBOARD 7.5X6 YLW CONV (MISCELLANEOUS) ×6 IMPLANT
SUT MNCRL AB 4-0 PS2 18 (SUTURE) ×3 IMPLANT
SUT PROLENE 6 0 BV (SUTURE) ×3 IMPLANT
SUT VIC AB 3-0 SH 27 (SUTURE) ×2
SUT VIC AB 3-0 SH 27X BRD (SUTURE) ×1 IMPLANT
TOWEL GREEN STERILE (TOWEL DISPOSABLE) ×3 IMPLANT
UNDERPAD 30X30 (UNDERPADS AND DIAPERS) ×3 IMPLANT
WATER STERILE IRR 1000ML POUR (IV SOLUTION) ×3 IMPLANT

## 2018-03-20 NOTE — Progress Notes (Signed)
Patient with new onset peripheral edema in RUE. 3+ pitting edema. Contacted Doctor Tobias Alexander to come evaluate.

## 2018-03-20 NOTE — Anesthesia Postprocedure Evaluation (Signed)
Anesthesia Post Note  Patient: Elijah White  Procedure(s) Performed: ARTERIOVENOUS (AV) FISTULA CREATION ARM (Left Arm Upper)     Patient location during evaluation: PACU Anesthesia Type: MAC Level of consciousness: awake and alert Pain management: pain level controlled Vital Signs Assessment: post-procedure vital signs reviewed and stable Respiratory status: spontaneous breathing and respiratory function stable Cardiovascular status: stable Postop Assessment: no apparent nausea or vomiting Anesthetic complications: no    Last Vitals:  Vitals:   03/20/18 1045 03/20/18 1109  BP: (!) 155/73 (!) 159/74  Pulse: 75 74  Resp: 16 18  Temp:  36.7 C  SpO2: 93% 93%    Last Pain:  Vitals:   03/20/18 1109  TempSrc:   PainSc: 0-No pain                 Sholom Dulude DANIEL

## 2018-03-20 NOTE — Anesthesia Preprocedure Evaluation (Signed)
Anesthesia Evaluation  Patient identified by MRN, date of birth, ID band Patient awake    Reviewed: Allergy & Precautions, NPO status , Patient's Chart, lab work & pertinent test results  History of Anesthesia Complications Negative for: history of anesthetic complications  Airway Mallampati: II  TM Distance: >3 FB Neck ROM: Full    Dental  (+) Poor Dentition, Missing, Dental Advisory Given   Pulmonary Current Smoker,    Pulmonary exam normal        Cardiovascular hypertension, Normal cardiovascular exam     Neuro/Psych negative neurological ROS  negative psych ROS   GI/Hepatic Neg liver ROS, GERD  ,  Endo/Other  diabetes  Renal/GU Renal disease  negative genitourinary   Musculoskeletal negative musculoskeletal ROS (+)   Abdominal   Peds negative pediatric ROS (+)  Hematology negative hematology ROS (+)   Anesthesia Other Findings   Reproductive/Obstetrics negative OB ROS                             Anesthesia Physical Anesthesia Plan  ASA: III  Anesthesia Plan: MAC   Post-op Pain Management:    Induction:   PONV Risk Score and Plan: 1 and Ondansetron and Propofol infusion  Airway Management Planned: Simple Face Mask  Additional Equipment:   Intra-op Plan:   Post-operative Plan:   Informed Consent: I have reviewed the patients History and Physical, chart, labs and discussed the procedure including the risks, benefits and alternatives for the proposed anesthesia with the patient or authorized representative who has indicated his/her understanding and acceptance.     Dental advisory given  Plan Discussed with: CRNA and Anesthesiologist  Anesthesia Plan Comments:         Anesthesia Quick Evaluation

## 2018-03-20 NOTE — H&P (Signed)
   History and Physical Update  The patient was interviewed and re-examined.  The patient's previous History and Physical has been reviewed and is unchanged from recent office visit. Plan for left arm avf vs avg.   Lorien Shingler C. Donzetta Matters, MD [facility] Office: [phone] Pager: [phone]   03/20/2018, 8:10 AM

## 2018-03-20 NOTE — Op Note (Signed)
    Patient name: Elijah White MRN: [id number] DOB: [birth year] Sex: male  03/20/2018 Pre-operative Diagnosis: Chronic kidney disease stage V Post-operative diagnosis:  Same Surgeon:  Eda Paschal. Donzetta Matters, MD Assistant: Arlee Muslim, PA Procedure Performed: Left upper arm for stage basilic vein transposition fistula creation  Indications: 78 year old male with chronic kidney disease.  He has never needed dialysis in the past but does have significant systemic edema at this point.  He is indicated for dialysis access in the left upper extremity with either fistula or graft.  Findings: By intraoperative ultrasound there was not sufficient cephalic vein measuring less than 2 mm above the antecubitum.  The basilic vein did not appear to be suitable was compressible and healthy.  This was easily dilated to 4 mm.  At completion there was a strong thrill in the vein palpable radial artery pulse at the wrist.   Procedure:  The patient was identified in the holding area and taken to the operating room where is placed supine operative table and MAC anesthesia was induced.  He was gently prepped draped in the left upper extremity in usual fashion, antibiotics were administered, a timeout was called.  We began using ultrasound to evaluate the veins of his upper arm.  The basilic vein did seem suitable above the antecubitum.  We anesthetize this area overlying the vein and the artery and then made a curvilinear incision above the antecubitum.  We first dissected out the vein.  One branch was taken between clips and ties.  We marked the vein for orientation.  We then dissected to the deep fascia identified the brachial artery placed a vessel loop around this.  The vein was then transected distally dilated up to 4 mm serially and spatulated.  The artery was then clamped distally proximally opened longitudinally.  The vein had been flushed with heparinized saline now the artery was flushed in both directions with  heparinized saline.  The vein was then sewn end-to-side with 6-0 Prolene suture.  Prior to completion anastomosis we allowed flushing all directions.  Upon completion there was a strong thrill confirmed with Doppler palpable radial pulse at the wrist also confirmed with Doppler that did not augment with compression of the fistula.  Satisfied with this we obtained hemostasis in the wound closed in layers with Vicryl Monocryl.  Dermabond placed to level skin.  All counts were correct at completion.  He was awakened anesthesia having tolerated procedure well.immediate complication.  EBL: 20 cc    Safiya Girdler C. Donzetta Matters, MD [facility] Office: [phone] Pager: [phone]

## 2018-03-20 NOTE — Anesthesia Procedure Notes (Signed)
Procedure Name: MAC Date/Time: 03/20/2018 9:36 AM Performed by: Teressa Lower., CRNA Pre-anesthesia Checklist: Patient identified, Emergency Drugs available, Suction available, Patient being monitored and Timeout performed Oxygen Delivery Method: Simple face mask

## 2018-03-20 NOTE — Transfer of Care (Signed)
Immediate Anesthesia Transfer of Care Note  Patient: Elijah White  Procedure(s) Performed: ARTERIOVENOUS (AV) FISTULA CREATION ARM (Left Arm Upper)  Patient Location: PACU  Anesthesia Type:MAC  Level of Consciousness: awake, alert  and oriented  Airway & Oxygen Therapy: Patient Spontanous Breathing and Patient connected to face mask oxygen  Post-op Assessment: Report given to RN and Post -op Vital signs reviewed and stable  Post vital signs: Reviewed and stable  Last Vitals:  Vitals Value Taken Time  BP    Temp    Pulse 87 03/20/2018 10:15 AM  Resp 21 03/20/2018 10:15 AM  SpO2 98 % 03/20/2018 10:15 AM  Vitals shown include unvalidated device data.  Last Pain:  Vitals:   03/20/18 0817  TempSrc:   PainSc: 0-No pain      Patients Stated Pain Goal: 3 (09/38/18 2993)  Complications: No apparent anesthesia complications

## 2018-03-20 NOTE — Discharge Instructions (Signed)
° °  [facility] ° °Discharge Instructions ° °AV Fistula or Graft Surgery for Dialysis Access ° °Please refer to the following instructions for your post-procedure care. Your surgeon or physician assistant will discuss any changes with you. ° °Activity ° °You may drive the day following your surgery, if you are comfortable and no longer taking prescription pain medication. Resume full activity as the soreness in your incision resolves. ° °Bathing/Showering ° °You may shower after you go home. Keep your incision dry for 48 hours. Do not soak in a bathtub, hot tub, or swim until the incision heals completely. You may not shower if you have a hemodialysis catheter. ° °Incision Care ° °Clean your incision with mild soap and water after 48 hours. Pat the area dry with a clean towel. You do not need a bandage unless otherwise instructed. Do not apply any ointments or creams to your incision. You may have skin glue on your incision. Do not peel it off. It will come off on its own in about one week. Your arm may swell a bit after surgery. To reduce swelling use pillows to elevate your arm so it is above your heart. Your doctor will tell you if you need to lightly wrap your arm with an ACE bandage. ° °Diet ° °Resume your normal diet. There are not special food restrictions following this procedure. In order to heal from your surgery, it is CRITICAL to get adequate nutrition. Your body requires vitamins, minerals, and protein. Vegetables are the best source of vitamins and minerals. Vegetables also provide the perfect balance of protein. Processed food has little nutritional value, so try to avoid this. ° °Medications ° °Resume taking all of your medications. If your incision is causing pain, you may take over-the counter pain relievers such as acetaminophen (Tylenol). If you were prescribed a stronger pain medication, please be aware these medications can cause nausea and constipation. Prevent  nausea by taking the medication with a snack or meal. Avoid constipation by drinking plenty of fluids and eating foods with high amount of fiber, such as fruits, vegetables, and grains. Do not take Tylenol if you are taking prescription pain medications. ° ° ° ° °Follow up °Your surgeon may want to see you in the office following your access surgery. If so, this will be arranged at the time of your surgery. ° °Please call us immediately for any of the following conditions: ° °Increased pain, redness, drainage (pus) from your incision site °Fever of 101 degrees or higher °Severe or worsening pain at your incision site °Hand pain or numbness. ° °Reduce your risk of vascular disease: ° °Stop smoking. If you would like help, call [facility] at 1-800-QUIT-NOW (1-800-784-8669) or  at 336-586-4000 ° °Manage your cholesterol °Maintain a desired weight °Control your diabetes °Keep your blood pressure down ° °Dialysis ° °It will take several weeks to several months for your new dialysis access to be ready for use. Your surgeon will determine when it is OK to use it. Your nephrologist will continue to direct your dialysis. You can continue to use your Permcath until your new access is ready for use. ° °If you have any questions, please call the office at [phone]. ° °

## 2018-03-21 ENCOUNTER — Telehealth: Payer: Self-pay | Admitting: Vascular Surgery

## 2018-03-21 ENCOUNTER — Encounter (HOSPITAL_COMMUNITY): Payer: Self-pay | Admitting: Vascular Surgery

## 2018-03-21 NOTE — Telephone Encounter (Signed)
sch appt spk to pt and daughter mld ltr 05-02-2018 8am Dialysis duplex 845am p/o MD

## 2018-03-21 NOTE — Telephone Encounter (Signed)
-----   Message from Dagoberto Ligas, PA-C sent at 03/20/2018 10:07 AM EST -----  Can you schedule this pt to see Dr. Donzetta Matters in 4-6 weeks with fistula duplex.  PO L arm 1st stage basilic fistula. Thanks, [facility]

## 2018-03-25 ENCOUNTER — Inpatient Hospital Stay (HOSPITAL_COMMUNITY)
Admission: EM | Admit: 2018-03-25 | Discharge: 2018-03-30 | DRG: 291 | Disposition: A | Discharge status: Home Health | Payer: Medicare Other | Attending: Internal Medicine | Admitting: Internal Medicine

## 2018-03-25 ENCOUNTER — Emergency Department (HOSPITAL_COMMUNITY): Payer: Medicare Other

## 2018-03-25 ENCOUNTER — Other Ambulatory Visit: Payer: Self-pay

## 2018-03-25 ENCOUNTER — Encounter (HOSPITAL_COMMUNITY): Payer: Self-pay | Admitting: Internal Medicine

## 2018-03-25 DIAGNOSIS — E785 Hyperlipidemia, unspecified: Secondary | ICD-10-CM | POA: Diagnosis present

## 2018-03-25 DIAGNOSIS — N179 Acute kidney failure, unspecified: Secondary | ICD-10-CM | POA: Diagnosis present

## 2018-03-25 DIAGNOSIS — I10 Essential (primary) hypertension: Secondary | ICD-10-CM | POA: Diagnosis present

## 2018-03-25 DIAGNOSIS — Z8249 Family history of ischemic heart disease and other diseases of the circulatory system: Secondary | ICD-10-CM | POA: Diagnosis not present

## 2018-03-25 DIAGNOSIS — F17210 Nicotine dependence, cigarettes, uncomplicated: Secondary | ICD-10-CM | POA: Diagnosis present

## 2018-03-25 DIAGNOSIS — D631 Anemia in chronic kidney disease: Secondary | ICD-10-CM | POA: Diagnosis present

## 2018-03-25 DIAGNOSIS — I132 Hypertensive heart and chronic kidney disease with heart failure and with stage 5 chronic kidney disease, or end stage renal disease: Secondary | ICD-10-CM | POA: Diagnosis present

## 2018-03-25 DIAGNOSIS — I248 Other forms of acute ischemic heart disease: Secondary | ICD-10-CM | POA: Diagnosis present

## 2018-03-25 DIAGNOSIS — H409 Unspecified glaucoma: Secondary | ICD-10-CM | POA: Diagnosis present

## 2018-03-25 DIAGNOSIS — E782 Mixed hyperlipidemia: Secondary | ICD-10-CM | POA: Diagnosis present

## 2018-03-25 DIAGNOSIS — K219 Gastro-esophageal reflux disease without esophagitis: Secondary | ICD-10-CM | POA: Diagnosis present

## 2018-03-25 DIAGNOSIS — Z86010 Personal history of colonic polyps: Secondary | ICD-10-CM | POA: Diagnosis not present

## 2018-03-25 DIAGNOSIS — G250 Essential tremor: Secondary | ICD-10-CM | POA: Diagnosis present

## 2018-03-25 DIAGNOSIS — N049 Nephrotic syndrome with unspecified morphologic changes: Secondary | ICD-10-CM

## 2018-03-25 DIAGNOSIS — Z6833 Body mass index (BMI) 33.0-33.9, adult: Secondary | ICD-10-CM | POA: Diagnosis not present

## 2018-03-25 DIAGNOSIS — Z79891 Long term (current) use of opiate analgesic: Secondary | ICD-10-CM | POA: Diagnosis not present

## 2018-03-25 DIAGNOSIS — N2581 Secondary hyperparathyroidism of renal origin: Secondary | ICD-10-CM | POA: Diagnosis present

## 2018-03-25 DIAGNOSIS — I5033 Acute on chronic diastolic (congestive) heart failure: Secondary | ICD-10-CM | POA: Diagnosis present

## 2018-03-25 DIAGNOSIS — E611 Iron deficiency: Secondary | ICD-10-CM | POA: Diagnosis present

## 2018-03-25 DIAGNOSIS — Z7984 Long term (current) use of oral hypoglycemic drugs: Secondary | ICD-10-CM | POA: Diagnosis not present

## 2018-03-25 DIAGNOSIS — N184 Chronic kidney disease, stage 4 (severe): Secondary | ICD-10-CM

## 2018-03-25 DIAGNOSIS — E1122 Type 2 diabetes mellitus with diabetic chronic kidney disease: Secondary | ICD-10-CM | POA: Diagnosis present

## 2018-03-25 DIAGNOSIS — Z79899 Other long term (current) drug therapy: Secondary | ICD-10-CM

## 2018-03-25 DIAGNOSIS — E1165 Type 2 diabetes mellitus with hyperglycemia: Secondary | ICD-10-CM | POA: Diagnosis present

## 2018-03-25 DIAGNOSIS — E669 Obesity, unspecified: Secondary | ICD-10-CM | POA: Diagnosis present

## 2018-03-25 DIAGNOSIS — N185 Chronic kidney disease, stage 5: Secondary | ICD-10-CM | POA: Diagnosis present

## 2018-03-25 DIAGNOSIS — I5032 Chronic diastolic (congestive) heart failure: Secondary | ICD-10-CM | POA: Diagnosis present

## 2018-03-25 DIAGNOSIS — N189 Chronic kidney disease, unspecified: Secondary | ICD-10-CM

## 2018-03-25 DIAGNOSIS — M7989 Other specified soft tissue disorders: Secondary | ICD-10-CM | POA: Diagnosis not present

## 2018-03-25 LAB — CBC WITH DIFFERENTIAL/PLATELET
Abs Immature Granulocytes: 0.01 10*3/uL (ref 0.00–0.07)
Basophils Absolute: 0 10*3/uL (ref 0.0–0.1)
Basophils Relative: 0 %
Eosinophils Absolute: 0.1 10*3/uL (ref 0.0–0.5)
Eosinophils Relative: 1 %
HCT: 34.7 % — ABNORMAL LOW (ref 39.0–52.0)
Hemoglobin: 10.6 g/dL — ABNORMAL LOW (ref 13.0–17.0)
Immature Granulocytes: 0 %
Lymphocytes Relative: 18 %
Lymphs Abs: 1.2 10*3/uL (ref 0.7–4.0)
MCH: 30.7 pg (ref 26.0–34.0)
MCHC: 30.5 g/dL (ref 30.0–36.0)
MCV: 100.6 fL — ABNORMAL HIGH (ref 80.0–100.0)
Monocytes Absolute: 1 10*3/uL (ref 0.1–1.0)
Monocytes Relative: 14 %
Neutro Abs: 4.6 10*3/uL (ref 1.7–7.7)
Neutrophils Relative %: 67 %
Platelets: 193 10*3/uL (ref 150–400)
RBC: 3.45 MIL/uL — ABNORMAL LOW (ref 4.22–5.81)
RDW: 14.6 % (ref 11.5–15.5)
WBC: 6.9 10*3/uL (ref 4.0–10.5)
nRBC: 0 % (ref 0.0–0.2)

## 2018-03-25 LAB — TROPONIN I: Troponin I: 0.04 ng/mL

## 2018-03-25 LAB — BASIC METABOLIC PANEL WITH GFR
Anion gap: 11 (ref 5–15)
BUN: 56 mg/dL — ABNORMAL HIGH (ref 8–23)
CO2: 20 mmol/L — ABNORMAL LOW (ref 22–32)
Calcium: 8.7 mg/dL — ABNORMAL LOW (ref 8.9–10.3)
Chloride: 110 mmol/L (ref 98–111)
Creatinine, Ser: 5.8 mg/dL — ABNORMAL HIGH (ref 0.61–1.24)
GFR calc Af Amer: 10 mL/min — ABNORMAL LOW
GFR calc non Af Amer: 9 mL/min — ABNORMAL LOW
Glucose, Bld: 129 mg/dL — ABNORMAL HIGH (ref 70–99)
Potassium: 4.5 mmol/L (ref 3.5–5.1)
Sodium: 141 mmol/L (ref 135–145)

## 2018-03-25 LAB — BRAIN NATRIURETIC PEPTIDE: B Natriuretic Peptide: 871 pg/mL — ABNORMAL HIGH (ref 0.0–100.0)

## 2018-03-25 LAB — IRON AND TIBC
Iron: 33 ug/dL — ABNORMAL LOW (ref 45–182)
Saturation Ratios: 13 % — ABNORMAL LOW (ref 17.9–39.5)
TIBC: 248 ug/dL — ABNORMAL LOW (ref 250–450)
UIBC: 215 ug/dL

## 2018-03-25 LAB — CBC
HCT: 33.9 % — ABNORMAL LOW (ref 39.0–52.0)
Hemoglobin: 10.4 g/dL — ABNORMAL LOW (ref 13.0–17.0)
MCH: 30.8 pg (ref 26.0–34.0)
MCHC: 30.7 g/dL (ref 30.0–36.0)
MCV: 100.3 fL — AB (ref 80.0–100.0)
Platelets: 156 10*3/uL (ref 150–400)
RBC: 3.38 MIL/uL — ABNORMAL LOW (ref 4.22–5.81)
RDW: 14.5 % (ref 11.5–15.5)
WBC: 6.6 10*3/uL (ref 4.0–10.5)
nRBC: 0 % (ref 0.0–0.2)

## 2018-03-25 LAB — ABO/RH: ABO/RH(D): O POS

## 2018-03-25 LAB — TYPE AND SCREEN
ABO/RH(D): O POS
ANTIBODY SCREEN: NEGATIVE

## 2018-03-25 LAB — CREATININE, SERUM
Creatinine, Ser: 5.8 mg/dL — ABNORMAL HIGH (ref 0.61–1.24)
GFR calc Af Amer: 10 mL/min — ABNORMAL LOW (ref >60)
GFR calc non Af Amer: 9 mL/min — ABNORMAL LOW (ref >60)

## 2018-03-25 LAB — PROTIME-INR
INR: 1.11
Prothrombin Time: 14.2 seconds (ref 11.4–15.2)

## 2018-03-25 LAB — RETICULOCYTES
Immature Retic Fract: 9 % (ref 2.3–15.9)
RBC.: 3.38 MIL/uL — ABNORMAL LOW (ref 4.22–5.81)
RETIC COUNT ABSOLUTE: 42.6 10*3/uL (ref 19.0–186.0)
Retic Ct Pct: 1.3 % (ref 0.4–3.1)

## 2018-03-25 LAB — VITAMIN B12: Vitamin B-12: 565 pg/mL (ref 180–914)

## 2018-03-25 LAB — HEMOGLOBIN A1C
HEMOGLOBIN A1C: 6.3 % — AB (ref 4.8–5.6)
Mean Plasma Glucose: 134.11 mg/dL

## 2018-03-25 LAB — FOLATE: Folate: 8.3 ng/mL (ref >5.9)

## 2018-03-25 LAB — TSH: TSH: 1.169 u[IU]/mL (ref 0.350–4.500)

## 2018-03-25 LAB — CBG MONITORING, ED: Glucose-Capillary: 127 mg/dL — ABNORMAL HIGH (ref 70–99)

## 2018-03-25 LAB — GLUCOSE, CAPILLARY: Glucose-Capillary: 164 mg/dL — ABNORMAL HIGH (ref 70–99)

## 2018-03-25 LAB — FERRITIN: Ferritin: 269 ng/mL (ref 24–336)

## 2018-03-25 MED ORDER — INSULIN ASPART 100 UNIT/ML ~~LOC~~ SOLN: 0.0000 [IU] | Freq: Every day | SUBCUTANEOUS | Status: DC

## 2018-03-25 MED ORDER — LATANOPROST 0.005 % OP SOLN
1.0000 [drp] | Freq: Every day | OPHTHALMIC | Status: DC
  Administered 2018-03-25 – 2018-03-29 (×5): 1 [drp] via OPHTHALMIC
  Filled 2018-03-25: qty 2.5

## 2018-03-25 MED ORDER — FUROSEMIDE 10 MG/ML IJ SOLN
80.0000 mg | Freq: Two times a day (BID) | INTRAMUSCULAR | Status: DC
  Administered 2018-03-26 – 2018-03-27 (×3): 80 mg via INTRAVENOUS
  Filled 2018-03-25 (×3): qty 8

## 2018-03-25 MED ORDER — PANTOPRAZOLE SODIUM 40 MG PO TBEC
40.0000 mg | DELAYED_RELEASE_TABLET | Freq: Every day | ORAL | Status: DC
  Administered 2018-03-25 – 2018-03-30 (×6): 40 mg via ORAL
  Filled 2018-03-25 (×6): qty 1

## 2018-03-25 MED ORDER — BRIMONIDINE TARTRATE 0.15 % OP SOLN
1.0000 [drp] | Freq: Three times a day (TID) | OPHTHALMIC | Status: DC
  Administered 2018-03-25 – 2018-03-30 (×14): 1 [drp] via OPHTHALMIC
  Filled 2018-03-25: qty 5

## 2018-03-25 MED ORDER — ATORVASTATIN CALCIUM 10 MG PO TABS
10.0000 mg | ORAL_TABLET | Freq: Every day | ORAL | Status: DC
  Administered 2018-03-25 – 2018-03-29 (×5): 10 mg via ORAL
  Filled 2018-03-25 (×5): qty 1

## 2018-03-25 MED ORDER — NITROGLYCERIN 2 % TD OINT
0.5000 [in_us] | TOPICAL_OINTMENT | Freq: Four times a day (QID) | TRANSDERMAL | Status: DC
  Administered 2018-03-25 – 2018-03-30 (×17): 0.5 [in_us] via TOPICAL
  Filled 2018-03-25: qty 1
  Filled 2018-03-25: qty 30
  Filled 2018-03-25: qty 1

## 2018-03-25 MED ORDER — AMLODIPINE BESYLATE 10 MG PO TABS
10.0000 mg | ORAL_TABLET | Freq: Every day | ORAL | Status: DC
  Administered 2018-03-25 – 2018-03-30 (×6): 10 mg via ORAL
  Filled 2018-03-25 (×5): qty 1
  Filled 2018-03-25: qty 2

## 2018-03-25 MED ORDER — AMLODIPINE BESYLATE 5 MG PO TABS: 10.0000 mg | ORAL_TABLET | Freq: Every day | ORAL | Status: DC

## 2018-03-25 MED ORDER — ACETAMINOPHEN 650 MG RE SUPP: 650.0000 mg | Freq: Four times a day (QID) | RECTAL | Status: DC | PRN

## 2018-03-25 MED ORDER — INSULIN ASPART 100 UNIT/ML ~~LOC~~ SOLN
0.0000 [IU] | Freq: Three times a day (TID) | SUBCUTANEOUS | Status: DC
  Administered 2018-03-25 – 2018-03-28 (×4): 2 [IU] via SUBCUTANEOUS
  Administered 2018-03-28: 3 [IU] via SUBCUTANEOUS
  Administered 2018-03-29 (×2): 2 [IU] via SUBCUTANEOUS

## 2018-03-25 MED ORDER — HYDROCODONE-ACETAMINOPHEN 5-325 MG PO TABS
1.0000 | ORAL_TABLET | Freq: Four times a day (QID) | ORAL | Status: DC | PRN
  Administered 2018-03-27: 1 via ORAL
  Filled 2018-03-25 (×2): qty 1

## 2018-03-25 MED ORDER — CALCITRIOL 0.25 MCG PO CAPS
0.2500 ug | ORAL_CAPSULE | Freq: Every day | ORAL | Status: DC
  Administered 2018-03-26 – 2018-03-30 (×5): 0.25 ug via ORAL
  Filled 2018-03-25 (×5): qty 1

## 2018-03-25 MED ORDER — HYDRALAZINE HCL 20 MG/ML IJ SOLN: 10.0000 mg | Freq: Four times a day (QID) | INTRAMUSCULAR | Status: DC | PRN

## 2018-03-25 MED ORDER — SODIUM CHLORIDE 0.9% FLUSH
3.0000 mL | Freq: Two times a day (BID) | INTRAVENOUS | Status: DC
  Administered 2018-03-25 – 2018-03-26 (×4): 3 mL via INTRAVENOUS
  Administered 2018-03-27: 10 mL via INTRAVENOUS
  Administered 2018-03-27 – 2018-03-30 (×6): 3 mL via INTRAVENOUS

## 2018-03-25 MED ORDER — HEPARIN SODIUM (PORCINE) 5000 UNIT/ML IJ SOLN
5000.0000 [IU] | Freq: Three times a day (TID) | INTRAMUSCULAR | Status: DC
  Administered 2018-03-25 – 2018-03-30 (×14): 5000 [IU] via SUBCUTANEOUS
  Filled 2018-03-25 (×15): qty 1

## 2018-03-25 MED ORDER — PILOCARPINE HCL 4 % OP SOLN
1.0000 [drp] | Freq: Three times a day (TID) | OPHTHALMIC | Status: DC
  Administered 2018-03-25 – 2018-03-30 (×14): 1 [drp] via OPHTHALMIC
  Filled 2018-03-25: qty 15

## 2018-03-25 MED ORDER — ACETAMINOPHEN 325 MG PO TABS: 650.0000 mg | ORAL_TABLET | Freq: Four times a day (QID) | ORAL | Status: DC | PRN

## 2018-03-25 MED ORDER — POLYETHYLENE GLYCOL 3350 17 G PO PACK
17.0000 g | PACK | Freq: Every day | ORAL | Status: DC | PRN
  Administered 2018-03-27: 17 g via ORAL
  Filled 2018-03-25: qty 1

## 2018-03-25 MED ORDER — FUROSEMIDE 10 MG/ML IJ SOLN
100.0000 mg | Freq: Once | INTRAVENOUS | Status: AC
  Administered 2018-03-25: 100 mg via INTRAVENOUS
  Filled 2018-03-25: qty 10

## 2018-03-25 MED ORDER — HYDRALAZINE HCL 50 MG PO TABS
100.0000 mg | ORAL_TABLET | Freq: Three times a day (TID) | ORAL | Status: DC
  Administered 2018-03-25 – 2018-03-30 (×15): 100 mg via ORAL
  Filled 2018-03-25 (×10): qty 2
  Filled 2018-03-25: qty 4
  Filled 2018-03-25 (×5): qty 2

## 2018-03-25 NOTE — Consult Note (Signed)
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  He underwent first stage left brachiobasilic fistula creation last week anticipating renal replacement therapy in the near future.  When last seen by Dr. Florene Glen at [facility] creatinine was 3.5.  He was brought to the emergency room by his daughter (formerly a Marine scientist on the renal floor here) with complaints of increasing shortness of breath and was found to have volume overload with worsening renal function (creatinine up from 4.3 last month now to 5.8).  He denies any cough, sputum production, fevers or chills.  Denies any nasal drainage or congestion.  Denies any hemoptysis.  Denies any chest pain/substernal pressure and reports some recent increasing lower extremity edema.  He denies any dysuria, urgency, frequency, flank pain, fever or chills.  Past Medical History:  Diagnosis Date  . Chronic kidney disease   . Diabetes mellitus without complication ([place])   . Elevated cholesterol with high triglycerides   . Hypertension     Past Surgical History:  Procedure Laterality Date  . AV FISTULA PLACEMENT Left 03/20/2018   Procedure: ARTERIOVENOUS (AV) FISTULA CREATION ARM;  Surgeon: [name], MD;  Location: [facility];  Service: Vascular;  Laterality: Left;  . PROSTATE SURGERY      Family History  Problem Relation Age of Onset  . CAD Mother     Social History:  reports that he has been smoking. He has been smoking about 0.50 packs per day. He has never used smokeless tobacco. He reports that he does not drink alcohol or use drugs.  Allergies: No Known Allergies  Medications:  Scheduled: .  amLODipine  10 mg Oral Daily  . atorvastatin  10 mg Oral q1800  . brimonidine  1 drop Right Eye TID  . [START ON 03/26/2018] calcitRIOL  0.25 mcg Oral Daily  . furosemide  80 mg Intravenous BID  . heparin  5,000 Units Subcutaneous Q8H  . hydrALAZINE  100 mg Oral Q8H  . insulin aspart  0-15 Units Subcutaneous TID WC  . insulin aspart  0-5 Units Subcutaneous QHS  . latanoprost  1 drop Right Eye QHS  . nitroGLYCERIN  0.5 inch Topical Q6H  . pantoprazole  40 mg Oral Daily  . pilocarpine  1 drop Right Eye TID  . sodium chloride flush  3 mL Intravenous Q12H    BMP Latest Ref Rng & Units 03/25/2018 03/20/2018 02/19/2018  Glucose 70 - 99 mg/dL 129(H) 98 74  BUN 8 - 23 mg/dL 56(H) - 39(H)  Creatinine 0.61 - 1.24 mg/dL 5.80(H) - 4.28(H)  BUN/Creat Ratio 10 - 24 - - 9(L)  Sodium 135 - 145 mmol/L 141 143 144  Potassium 3.5 - 5.1 mmol/L 4.5 4.3 4.0  Chloride 98 - 111 mmol/L 110 - 107(H)  CO2 22 - 32 mmol/L 20(L) - 18(L)  Calcium 8.9 - 10.3 mg/dL 8.7(L) - 9.3   CBC Latest Ref Rng & Units 03/25/2018 03/20/2018 02/19/2018  WBC 4.0 - 10.5 K/uL 6.9 - 6.0  Hemoglobin 13.0 - 17.0 g/dL 10.6(L) 10.5(L) 12.7(L)  Hematocrit 39.0 - 52.0 % 34.7(L) 31.0(L) 38.7  Platelets 150 - 400 K/uL 193 - 230    Dg Chest Portable 1 View  Result Date: 03/25/2018 CLINICAL  DATA:  Acute shortness of breath.  Patient on dialysis. EXAM: PORTABLE CHEST 1 VIEW COMPARISON:  09/01/2017 and prior radiographs FINDINGS: Mild cardiomegaly with pulmonary vascular congestion noted. Bilateral perihilar airspace opacities noted, LEFT-greater-than-RIGHT. There may be trace bilateral pleural effusions present. There is no evidence of pneumothorax or acute bony abnormality. IMPRESSION: Bilateral perihilar airspace opacities, LEFT-greater-than-RIGHT, slightly favor asymmetric pulmonary edema over infection but correlate clinically. Mild cardiomegaly with pulmonary vascular congestion and trace bilateral pleural effusions. Electronically Signed    By: Margarette Canada M.D.   On: 03/25/2018 11:51    Review of Systems  Constitutional: Positive for malaise/fatigue. Negative for chills and fever.  HENT: Negative.   Eyes: Positive for blurred vision.       Glaucoma with vision loss left eye and limited vision right eye  Respiratory: Positive for shortness of breath. Negative for cough, hemoptysis and sputum production.   Cardiovascular: Positive for orthopnea and leg swelling. Negative for chest pain and palpitations.  Gastrointestinal: Negative.   Genitourinary: Negative.   Musculoskeletal: Negative.   Skin: Negative.   Neurological: Positive for tremors and weakness. Negative for dizziness and headaches.       Reports of neuropathic symptoms hands and legs.  Chronic essential tremor.   Blood pressure (!) 184/89, pulse (!) 101, temperature (!) 97.5 F (36.4 C), temperature source Oral, resp. rate (!) 23, height 5\' 5"  (1.651 m), weight 92.5 kg, SpO2 96 %. Physical Exam  Nursing note and vitals reviewed. Constitutional: He is oriented to person, place, and time. He appears well-developed and well-nourished. No distress.  HENT:  Head: Normocephalic and atraumatic.  Mouth/Throat: Oropharynx is clear and moist.  Eyes: Pupils are equal, round, and reactive to light. Conjunctivae and EOM are normal. No scleral icterus.  Neck: Normal range of motion. Neck supple. JVD present.  Cardiovascular: Regular rhythm. Exam reveals no friction rub.  No murmur heard. Regular tachycardia  Respiratory: Effort normal. He has wheezes. He has rales.  Fine rales bibasilarly with intermittent expiratory wheeze  GI: Soft. Bowel sounds are normal. He exhibits no distension. There is no abdominal tenderness. There is no rebound.  Musculoskeletal:        General: Edema present.     Comments: 2+ lower extremity edema.  No asterixis.  Palpable thrill over left upper arm 1st stage BBF.  Neurological: He is alert and oriented to person, place, and time.  Skin: Skin  is warm and dry. No rash noted.  Psychiatric: He has a normal mood and affect.    Assessment/Plan: 1.  Acute kidney injury on chronic kidney disease stage V: Possibly hemodynamically mediated in the setting of congestive heart failure.  Fortunately, he does not appear to have any uremic symptoms or acute indications for dialysis.  Will consider dialysis if he is refractory to diuretic therapy for volume unloading or he develops uremic symptoms.  We will continue to avoid hypotension, NSAIDs and iodinated intravenous contrast.  Continue strict input/output monitoring and low-sodium diet. 2.  Acute exacerbation of diastolic heart failure: Previously on furosemide 80 mg daily by mouth-uptitrated diuretics to 80 mg twice daily IV.  Continue to monitor input/output and daily weights. 3.  Anemia: This appears to be anemia of chronic kidney disease and possibly exacerbated by recent surgery.  Will check iron studies. 4.  Hypertension: Blood pressures elevated, resume antihypertensive therapy that he takes as an outpatient and monitor with volume unloading/diuresis. 5.  Secondary hyperparathyroidism: We will follow calcium/phosphorus levels.  Crystle Carelli K. 03/25/2018, 2:03 PM

## 2018-03-25 NOTE — ED Notes (Signed)
ED Provider at bedside. 

## 2018-03-25 NOTE — ED Notes (Signed)
Dr. Wilson Singer made aware of critical 0.04 troponin.

## 2018-03-25 NOTE — ED Provider Notes (Signed)
[facility] EMERGENCY DEPARTMENT Provider Note   CSN: [id number] Arrival date & time: 03/25/18  1040     History   Chief Complaint Chief Complaint  Patient presents with  . Shortness of Breath  . Facial Swelling    HPI Elijah White is a 78 y.o. male.  HPI   78 year old male with dyspnea.  Acute on chronic.  He reports worsening over the past week or so.  Worse with exertion.  He states that he could barely walk from across the room without having to stop and take a break.  Orthopnea.  Chronic peripheral edema.  Denies any acute change.  Does not weigh himself regularly.  Reports compliance with his medications.  Very advanced chronic kidney disease.  Recent fistula placement in his left arm.  He has not been started on dialysis yet though. Daughter also concerned about safety at home. Lives alone. Very poor eyesight. She found pills all over the floor. The floor was also slippery where he had spilled grease.   Past Medical History:  Diagnosis Date  . Chronic kidney disease   . Diabetes mellitus without complication ([place])   . Elevated cholesterol with high triglycerides   . Hypertension     Patient Active Problem List   Diagnosis Date Noted  . 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 Vision loss of right eye 11/11/2017  . Acute decompensated heart failure ([place]) 09/01/2017  . Bilateral pseudophakia 07/11/2017  . 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 GERD (gastroesophageal reflux disease) 09/10/2016  . HLD (hyperlipidemia) 09/10/2016  . Dizziness 08/13/2016  . Renal failure (ARF), acute on chronic (HCC) 08/13/2016  . Essential hypertension 08/13/2016  . Cataract, nuclear 10/29/2012  . 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  . Type 2 diabetes mellitus with renal manifestations ([place]) 10/22/2006  . BENIGN PROSTATIC HYPERTROPHY 10/22/2006  . COLONIC POLYPS, HX OF 10/22/2006    Past Surgical History:  Procedure Laterality Date  . AV  FISTULA PLACEMENT Left 03/20/2018   Procedure: ARTERIOVENOUS (AV) FISTULA CREATION ARM;  Surgeon: [name], MD;  Location: [facility];  Service: Vascular;  Laterality: Left;  . PROSTATE SURGERY          Home Medications    Prior to Admission medications   Medication Sig Start Date End Date Taking? Authorizing Provider  acetaminophen (TYLENOL) 500 MG tablet Take 1,000 mg by mouth every 6 (six) hours as needed for headache (pain).    [provider]  amLODipine (NORVASC) 10 MG tablet Take 1 tablet (10 mg total) by mouth daily. 02/20/18   Rodriguez-Southworth, Sunday Spillers, PA-C  atorvastatin (LIPITOR) 10 MG tablet TAKE 1 TABLET BY MOUTH ONCE DAILY 02/28/18   [name], MD  bimatoprost (LUMIGAN) 0.01 % SOLN Place 1 drop into the right eye at bedtime. 06/25/17   [provider]  brimonidine (ALPHAGAN) 0.2 % ophthalmic solution Place 1 drop into the right eye 3 (three) times daily.  08/02/17   [provider]  calcitRIOL (ROCALTROL) 0.25 MCG capsule Take 0.25 mcg by mouth daily.    [provider]  dorzolamide-timolol (COSOPT) 22.3-6.8 MG/ML ophthalmic solution Place 1 drop into the right eye 2 (two) times daily. 06/25/17   [provider]  esomeprazole (NEXIUM) 40 MG capsule TAKE 1 CAPSULE BY MOUTH ONCE DAILY 02/18/18   [name], MD  furosemide (LASIX) 80 MG tablet Take 80 mg by mouth daily.    [provider]  gabapentin (NEURONTIN) 100 MG capsule  TAKE ONE CAPSULE BY MOUTH AT BEDTIME 03/10/18   Rodriguez-Southworth, Sunday Spillers, PA-C  glipiZIDE (GLUCOTROL) 5 MG tablet Take 5 mg by mouth daily before breakfast.  01/11/16   [provider]  hydrALAZINE (APRESOLINE) 25 MG tablet TAKE 1 TABLET(25 MG) BY MOUTH TWICE DAILY Patient taking differently: Take 25 mg by mouth 2 (two) times daily.  02/21/18   Rodriguez-Southworth, Sunday Spillers, PA-C  HYDROcodone-acetaminophen (NORCO) 5-325 MG tablet Take 1 tablet by mouth every 6 (six) hours as  needed for moderate pain. 03/20/18   Dagoberto Ligas, PA-C  pilocarpine (PILOCAR) 4 % ophthalmic solution Place 1 drop into the right eye 3 (three) times daily.    [provider]    Family History Family History  Problem Relation Age of Onset  . CAD Mother     Social History Social History   Tobacco Use  . Smoking status: Current Every Day Smoker    Packs/day: 0.50  . Smokeless tobacco: Never Used  Substance Use Topics  . Alcohol use: No  . Drug use: No     Allergies   Patient has no known allergies.   Review of Systems Review of Systems  All systems reviewed and negative, other than as noted in HPI.  Physical Exam Updated Vital Signs Ht 5\' 5"  (1.651 m)   Wt 92.5 kg   SpO2 93%   BMI 33.95 kg/m   Physical Exam Vitals signs and nursing note reviewed.  Constitutional:      General: He is not in acute distress.    Appearance: He is well-developed.     Comments: Laying in bed.  Appears tired, but not distressed.  HENT:     Head: Normocephalic and atraumatic.  Eyes:     General:        Right eye: No discharge.        Left eye: No discharge.     Conjunctiva/sclera: Conjunctivae normal.  Neck:     Musculoskeletal: Neck supple.  Cardiovascular:     Rate and Rhythm: Normal rate and regular rhythm.     Heart sounds: Normal heart sounds. No murmur. No friction rub. No gallop.   Pulmonary:     Effort: Pulmonary effort is normal. No respiratory distress.     Breath sounds: Normal breath sounds.  Abdominal:     General: There is no distension.     Palpations: Abdomen is soft.     Tenderness: There is no abdominal tenderness.  Musculoskeletal:        General: No tenderness.     Right lower leg: Edema present.     Left lower leg: Edema present.     Comments: Severe, symmetric pitting lower extremity edema.  Pitting edema of the right upper extremity.  Surgical site left upper extremity appears to be healing without complication.  Palpable thrill.   Skin:    General: Skin is warm and dry.  Neurological:     Mental Status: He is alert.  Psychiatric:        Behavior: Behavior normal.        Thought Content: Thought content normal.      ED Treatments / Results  Labs (all labs ordered are listed, but only abnormal results are displayed) Labs Reviewed  CBC WITH DIFFERENTIAL/PLATELET - Abnormal; Notable for the following components:      Result Value   RBC 3.45 (*)    Hemoglobin 10.6 (*)    HCT 34.7 (*)    MCV 100.6 (*)    All  other components within normal limits  BASIC METABOLIC PANEL - Abnormal; Notable for the following components:   CO2 20 (*)    Glucose, Bld 129 (*)    BUN 56 (*)    Creatinine, Ser 5.80 (*)    Calcium 8.7 (*)    GFR calc non Af Amer 9 (*)    GFR calc Af Amer 10 (*)    All other components within normal limits  BRAIN NATRIURETIC PEPTIDE - Abnormal; Notable for the following components:   B Natriuretic Peptide 871.0 (*)    All other components within normal limits  TROPONIN I - Abnormal; Notable for the following components:   Troponin I 0.04 (*)    All other components within normal limits  HEMOGLOBIN A1C - Abnormal; Notable for the following components:   Hgb A1c MFr Bld 6.3 (*)    All other components within normal limits  IRON AND TIBC - Abnormal; Notable for the following components:   Iron 33 (*)    TIBC 248 (*)    Saturation Ratios 13 (*)    All other components within normal limits  RETICULOCYTES - Abnormal; Notable for the following components:   RBC. 3.38 (*)    All other components within normal limits  CBC - Abnormal; Notable for the following components:   RBC 3.38 (*)    Hemoglobin 10.4 (*)    HCT 33.9 (*)    MCV 100.3 (*)    All other components within normal limits  CREATININE, SERUM - Abnormal; Notable for the following components:   Creatinine, Ser 5.80 (*)    GFR calc non Af Amer 9 (*)    GFR calc Af Amer 10 (*)    All other components within normal limits  CBC -  Abnormal; Notable for the following components:   RBC 3.37 (*)    Hemoglobin 10.7 (*)    HCT 32.4 (*)    All other components within normal limits  RENAL FUNCTION PANEL - Abnormal; Notable for the following components:   Chloride 112 (*)    CO2 18 (*)    Glucose, Bld 134 (*)    BUN 61 (*)    Creatinine, Ser 5.68 (*)    Calcium 8.6 (*)    Albumin 2.9 (*)    GFR calc non Af Amer 9 (*)    GFR calc Af Amer 10 (*)    All other components within normal limits  GLUCOSE, CAPILLARY - Abnormal; Notable for the following components:   Glucose-Capillary 164 (*)    All other components within normal limits  GLUCOSE, CAPILLARY - Abnormal; Notable for the following components:   Glucose-Capillary 107 (*)    All other components within normal limits  GLUCOSE, CAPILLARY - Abnormal; Notable for the following components:   Glucose-Capillary 133 (*)    All other components within normal limits  BASIC METABOLIC PANEL - Abnormal; Notable for the following components:   CO2 21 (*)    Glucose, Bld 132 (*)    BUN 62 (*)    Creatinine, Ser 5.78 (*)    Calcium 8.5 (*)    GFR calc non Af Amer 9 (*)    GFR calc Af Amer 10 (*)    All other components within normal limits  GLUCOSE, CAPILLARY - Abnormal; Notable for the following components:   Glucose-Capillary 105 (*)    All other components within normal limits  GLUCOSE, CAPILLARY - Abnormal; Notable for the following components:   Glucose-Capillary 181 (*)  All other components within normal limits  GLUCOSE, CAPILLARY - Abnormal; Notable for the following components:   Glucose-Capillary 122 (*)    All other components within normal limits  GLUCOSE, CAPILLARY - Abnormal; Notable for the following components:   Glucose-Capillary 121 (*)    All other components within normal limits  BASIC METABOLIC PANEL - Abnormal; Notable for the following components:   CO2 21 (*)    Glucose, Bld 108 (*)    BUN 65 (*)    Creatinine, Ser 6.21 (*)    Calcium 8.7  (*)    GFR calc non Af Amer 8 (*)    GFR calc Af Amer 9 (*)    All other components within normal limits  GLUCOSE, CAPILLARY - Abnormal; Notable for the following components:   Glucose-Capillary 140 (*)    All other components within normal limits  GLUCOSE, CAPILLARY - Abnormal; Notable for the following components:   Glucose-Capillary 114 (*)    All other components within normal limits  GLUCOSE, CAPILLARY - Abnormal; Notable for the following components:   Glucose-Capillary 154 (*)    All other components within normal limits  BASIC METABOLIC PANEL - Abnormal; Notable for the following components:   Glucose, Bld 123 (*)    BUN 72 (*)    Creatinine, Ser 6.48 (*)    Calcium 8.7 (*)    GFR calc non Af Amer 7 (*)    GFR calc Af Amer 9 (*)    All other components within normal limits  GLUCOSE, CAPILLARY - Abnormal; Notable for the following components:   Glucose-Capillary 143 (*)    All other components within normal limits  GLUCOSE, CAPILLARY - Abnormal; Notable for the following components:   Glucose-Capillary 175 (*)    All other components within normal limits  GLUCOSE, CAPILLARY - Abnormal; Notable for the following components:   Glucose-Capillary 100 (*)    All other components within normal limits  GLUCOSE, CAPILLARY - Abnormal; Notable for the following components:   Glucose-Capillary 131 (*)    All other components within normal limits  BASIC METABOLIC PANEL - Abnormal; Notable for the following components:   Glucose, Bld 151 (*)    BUN 78 (*)    Creatinine, Ser 6.68 (*)    Calcium 8.5 (*)    GFR calc non Af Amer 7 (*)    GFR calc Af Amer 8 (*)    All other components within normal limits  GLUCOSE, CAPILLARY - Abnormal; Notable for the following components:   Glucose-Capillary 137 (*)    All other components within normal limits  GLUCOSE, CAPILLARY - Abnormal; Notable for the following components:   Glucose-Capillary 114 (*)    All other components within normal  limits  GLUCOSE, CAPILLARY - Abnormal; Notable for the following components:   Glucose-Capillary 113 (*)    All other components within normal limits  GLUCOSE, CAPILLARY - Abnormal; Notable for the following components:   Glucose-Capillary 141 (*)    All other components within normal limits  CBG MONITORING, ED - Abnormal; Notable for the following components:   Glucose-Capillary 127 (*)    All other components within normal limits  VITAMIN B12  FOLATE  FERRITIN  TSH  PROTIME-INR  MAGNESIUM  TYPE AND SCREEN  ABO/RH    EKG None  Radiology Dg Chest Portable 1 View  Result Date: 03/25/2018 CLINICAL DATA:  Acute shortness of breath.  Patient on dialysis. EXAM: PORTABLE CHEST 1 VIEW COMPARISON:  09/01/2017 and prior radiographs  FINDINGS: Mild cardiomegaly with pulmonary vascular congestion noted. Bilateral perihilar airspace opacities noted, LEFT-greater-than-RIGHT. There may be trace bilateral pleural effusions present. There is no evidence of pneumothorax or acute bony abnormality. IMPRESSION: Bilateral perihilar airspace opacities, LEFT-greater-than-RIGHT, slightly favor asymmetric pulmonary edema over infection but correlate clinically. Mild cardiomegaly with pulmonary vascular congestion and trace bilateral pleural effusions. Electronically Signed   By: Margarette Canada M.D.   On: 03/25/2018 11:51    Procedures Procedures (including critical care time)  Medications Ordered in ED Medications - No data to display   Initial Impression / Assessment and Plan / ED Course  I have reviewed the triage vital signs and the nursing notes.  Pertinent labs & imaging results that were available during my care of the patient were reviewed by me and considered in my medical decision making (see chart for details).     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  Significantly volume overloaded and progressively worsening.  Getting to the point of needing dialysis.  Does have fistula placed  but realistically not matured at this point.  High dose of Lasix was ordered.  Nephrology and medicine were consulted.    Final Clinical Impressions(s) / ED Diagnoses   Final diagnoses:  Chronic kidney disease (CKD), stage IV (severe) ([place])  Anasarca associated with disorder of kidney    ED Discharge Orders    None       Virgel Manifold, MD 03/31/18 1018

## 2018-03-25 NOTE — H&P (Signed)
TRH H&P   Patient Demographics:    Elijah White, is a 78 y.o. male  MRN: [id number]   DOB - 1940-11-30  Admit Date - 03/25/2018  Outpatient Primary MD for the patient is [name], MD  Outpatient Specialists: Dr [name]    Patient coming from: Home  Chief Complaint  Patient presents with  . Shortness of Breath  . Facial Swelling      HPI:    Elijah White  is a 78 y.o. male, with history of CKD 5 progressing towards ESRD, chronic diastolic CHF last known EF around 55% on recent echocardiogram, essential hypertension, DM type II, who lives at home by himself ambulates with the help of a walker, comes into the hospital with gradually progressive 1 week history of shortness of breath and orthopnea.  He denies any other medical problems, specifically no fever chills, no productive cough, no chest pain or abdominal pain.  He has also noticed gradual swelling in his arms and legs and multiple pounds of weight gain over the last several weeks.  Came to the ER for evaluation of his shortness of breath he was diagnosed with acute on chronic diastolic CHF, ARF on CKD 5, nephrology was consulted and I was requested to admit.  Patient after wearing some oxygen and sitting up in bed is feeling a whole lot better.    Review of systems:    A full 10 point Review of Systems was done, except as stated above, all other Review of Systems were negative.   With Past History of the following :    Past Medical History:  Diagnosis Date  . Chronic kidney disease   . Diabetes mellitus without complication ([place])   . Elevated cholesterol with high triglycerides   . Hypertension       Past Surgical History:  Procedure  Laterality Date  . AV FISTULA PLACEMENT Left 03/20/2018   Procedure: ARTERIOVENOUS (AV) FISTULA CREATION ARM;  Surgeon: [name], MD;  Location: [place];  Service: Vascular;  Laterality: Left;  . PROSTATE SURGERY        Social History:     Social History   Tobacco Use  . Smoking status: Current Every Day Smoker    Packs/day: 0.50  . Smokeless tobacco: Never Used  Substance Use Topics  . Alcohol use: No  Family History :     Family History  Problem Relation Age of Onset  . CAD Mother        Home Medications:   Prior to Admission medications   Medication Sig Start Date End Date Taking? Authorizing Provider  acetaminophen (TYLENOL) 500 MG tablet Take 1,000 mg by mouth every 6 (six) hours as needed for headache (pain).   Yes [provider]  amLODipine (NORVASC) 10 MG tablet Take 1 tablet (10 mg total) by mouth daily. 02/20/18  Yes Rodriguez-Southworth, Sunday Spillers, PA-C  atorvastatin (LIPITOR) 10 MG tablet TAKE 1 TABLET BY MOUTH ONCE DAILY Patient taking differently: Take 10 mg by mouth daily at 6 PM.  02/28/18  Yes [name], MD  bimatoprost (LUMIGAN) 0.01 % SOLN Place 1 drop into the right eye at bedtime. 06/25/17  Yes [provider]  brimonidine (ALPHAGAN) 0.15 % ophthalmic solution Place 1 drop into the right eye 3 (three) times daily.  08/02/17  Yes [provider]  calcitRIOL (ROCALTROL) 0.25 MCG capsule Take 0.25 mcg by mouth daily.   Yes [provider]  dorzolamide-timolol (COSOPT) 22.3-6.8 MG/ML ophthalmic solution Place 1 drop into the right eye 2 (two) times daily. 06/25/17  Yes [provider]  esomeprazole (NEXIUM) 40 MG capsule TAKE 1 CAPSULE BY MOUTH ONCE DAILY Patient taking differently: Take 40 mg by mouth at bedtime.  02/18/18  Yes [name], MD  furosemide (LASIX) 80 MG tablet Take 80 mg by mouth daily.   Yes [provider]  gabapentin (NEURONTIN) 100 MG capsule TAKE ONE  CAPSULE BY MOUTH AT BEDTIME Patient taking differently: Take 100 mg by mouth at bedtime.  03/10/18  Yes Rodriguez-Southworth, Sunday Spillers, PA-C  glipiZIDE (GLUCOTROL) 5 MG tablet Take 5 mg by mouth daily before breakfast.  01/11/16  Yes [provider]  hydrALAZINE (APRESOLINE) 25 MG tablet TAKE 1 TABLET(25 MG) BY MOUTH TWICE DAILY Patient taking differently: Take 25 mg by mouth 2 (two) times daily.  02/21/18  Yes Rodriguez-Southworth, Sunday Spillers, PA-C  HYDROcodone-acetaminophen (NORCO) 5-325 MG tablet Take 1 tablet by mouth every 6 (six) hours as needed for moderate pain. 03/20/18  Yes Dagoberto Ligas, PA-C  pilocarpine (PILOCAR) 4 % ophthalmic solution Place 1 drop into the right eye 3 (three) times daily.   Yes [provider]     Allergies:    No Known Allergies   Physical Exam:   Vitals  Blood pressure (!) 184/89, pulse (!) 101, temperature (!) 97.5 F (36.4 C), temperature source Oral, resp. rate (!) 23, height 5\' 5"  (1.651 m), weight 92.5 kg, SpO2 96 %.   1. General elderly African-American gentleman sitting up in hospital bed in no apparent discomfort,  2. Normal affect and insight, Not Suicidal or Homicidal, Awake Alert, Oriented X 3.  3. No F.N deficits, ALL C.Nerves Intact, Strength 5/5 all 4 extremities, Sensation intact all 4 extremities, Plantars down going.  4. Ears and Eyes appear Normal, Conjunctivae clear, PERRLA. Moist Oral Mucosa.  5. Supple Neck, elevated JVD, No cervical lymphadenopathy appriciated, No Carotid Bruits.  6. Symmetrical Chest wall movement, Good air movement bilaterally, fused bilateral rails,  7. RRR, No Gallops, Rubs or Murmurs, No Parasternal Heave.  8. Positive Bowel Sounds, Abdomen Soft, No tenderness, No organomegaly appriciated,No rebound -guarding or rigidity.  9.  No Cyanosis, Normal Skin Turgor, No Skin Rash or Bruise.  2+ diffuse edema  10. Good muscle tone,  joints appear normal , no effusions, Normal ROM.  11. No Palpable  Lymph  Nodes in Neck or Axillae      Data Review:    CBC Recent Labs  Lab 03/20/18 0856 03/25/18 1150  WBC  --  6.9  HGB 10.5* 10.6*  HCT 31.0* 34.7*  PLT  --  193  MCV  --  100.6*  MCH  --  30.7  MCHC  --  30.5  RDW  --  14.6  LYMPHSABS  --  1.2  MONOABS  --  1.0  EOSABS  --  0.1  BASOSABS  --  0.0   ------------------------------------------------------------------------------------------------------------------  Chemistries  Recent Labs  Lab 03/20/18 0856 03/25/18 1150  NA 143 141  K 4.3 4.5  CL  --  110  CO2  --  20*  GLUCOSE 98 129*  BUN  --  56*  CREATININE  --  5.80*  CALCIUM  --  8.7*   ------------------------------------------------------------------------------------------------------------------ estimated creatinine clearance is 10.8 mL/min (A) (by C-G formula based on SCr of 5.8 mg/dL (H)). ------------------------------------------------------------------------------------------------------------------ No results for input(s): TSH, T4TOTAL, T3FREE, THYROIDAB in the last 72 hours.  Invalid input(s): FREET3  Coagulation profile No results for input(s): INR, PROTIME in the last 168 hours. ------------------------------------------------------------------------------------------------------------------- No results for input(s): DDIMER in the last 72 hours. -------------------------------------------------------------------------------------------------------------------  Cardiac Enzymes Recent Labs  Lab 03/25/18 1150  TROPONINI 0.04*   ------------------------------------------------------------------------------------------------------------------    Component Value Date/Time   BNP 871.0 (H) 03/25/2018 1150     ---------------------------------------------------------------------------------------------------------------  Urinalysis    Component Value Date/Time   COLORURINE YELLOW 10/20/2017 1145   APPEARANCEUR CLOUDY (A) 10/20/2017  1145   LABSPEC 1.011 10/20/2017 1145   PHURINE 5.0 10/20/2017 1145   GLUCOSEU NEGATIVE 10/20/2017 1145   HGBUR SMALL (A) 10/20/2017 1145   BILIRUBINUR negative 02/19/2018 1643   KETONESUR NEGATIVE 10/20/2017 1145   PROTEINUR Positive (A) 02/19/2018 1643   PROTEINUR >=300 (A) 10/20/2017 1145   UROBILINOGEN 0.2 02/19/2018 1643   UROBILINOGEN 1.0 08/22/2012 1402   NITRITE negative 02/19/2018 1643   NITRITE NEGATIVE 10/20/2017 1145   LEUKOCYTESUR Negative 02/19/2018 1643    ----------------------------------------------------------------------------------------------------------------   Imaging Results:    Dg Chest Portable 1 View  Result Date: 03/25/2018 CLINICAL DATA:  Acute shortness of breath.  Patient on dialysis. EXAM: PORTABLE CHEST 1 VIEW COMPARISON:  09/01/2017 and prior radiographs FINDINGS: Mild cardiomegaly with pulmonary vascular congestion noted. Bilateral perihilar airspace opacities noted, LEFT-greater-than-RIGHT. There may be trace bilateral pleural effusions present. There is no evidence of pneumothorax or acute bony abnormality. IMPRESSION: Bilateral perihilar airspace opacities, LEFT-greater-than-RIGHT, slightly favor asymmetric pulmonary edema over infection but correlate clinically. Mild cardiomegaly with pulmonary vascular congestion and trace bilateral pleural effusions. Electronically Signed   By: Margarette Canada M.D.   On: 03/25/2018 11:51    My personal review of EKG: Rhythm NSR,  no Acute ST changes   Assessment & Plan:      1.  Acute on chronic diastolic CHF EF around 55 to 60% on recent echocardiogram.  This is likely due to his progressive CKD 5 now close to ESRD.  He will be admitted, he will be started on high-dose Lasix, Nitropaste, low-dose beta-blocker, fluid and salt restriction, oxygen and supplemental nebulizer treatments as needed.  Evaluate intake output, electrolytes and clinically.  2.  Mild borderline elevated troponin.  Due to #1 above from demand  ischemia, no chest pain on acute EKG.  No further work-up needed.  3.  ARF on CKD 5.  Gradually progressive, still makes urine, challenge with high-dose IV Lasix and monitor.  Avoid nephrotoxins.  Nephrology has been consulted.  He may require HD.  Ready has left arm AV fistula placed but it is not mature yet.  4.  Dyslipidemia.  Continue home dose statin.  5.  Central hypertension.  In poor control.  Placed on Nitropaste, high-dose Lasix, continue hydralazine but at a higher dose, continue home dose Norvasc.  Monitor and adjust.  6.  DM type II.  Will hold oral hypoglycemics due to worsening renal function, check A1c, sliding scale and monitor.  7.  Anemia of chronic disease.  Stable.  We will type screen, anemia panel.  And monitor.   DVT Prophylaxis Heparin    AM Labs Ordered, also please review Full Orders  Family Communication: Admission, patients condition and plan of care including tests being ordered have been discussed with the patient and daughter who indicate understanding and agree with the plan and Code Status.  Code Status Full  Likely DC to  Home 3-4 days  Condition GUARDED    Consults called: Renal    Admission status: Inpt    Time spent in minutes : 35   Lala Lund M.D on 03/25/2018 at 1:50 PM  To page go to [email or web address] - password [facility]

## 2018-03-25 NOTE — ED Triage Notes (Signed)
Pt BIB [facility] c/o shortness of breath upon exertion since yesterday morning. Bilateral lower leg edema present x3 months. Reports taking lasix regularly. Dialysis graft placed in left upper arm last week. Restricted left extremity band applied to patient. Vital signs stable at this time.

## 2018-03-25 NOTE — ED Notes (Signed)
Portable xray @bedside

## 2018-03-26 LAB — RENAL FUNCTION PANEL
Albumin: 2.9 g/dL — ABNORMAL LOW (ref 3.5–5.0)
Anion gap: 11 (ref 5–15)
BUN: 61 mg/dL — AB (ref 8–23)
CO2: 18 mmol/L — ABNORMAL LOW (ref 22–32)
Calcium: 8.6 mg/dL — ABNORMAL LOW (ref 8.9–10.3)
Chloride: 112 mmol/L — ABNORMAL HIGH (ref 98–111)
Creatinine, Ser: 5.68 mg/dL — ABNORMAL HIGH (ref 0.61–1.24)
GFR calc Af Amer: 10 mL/min — ABNORMAL LOW (ref >60)
GFR calc non Af Amer: 9 mL/min — ABNORMAL LOW (ref >60)
Glucose, Bld: 134 mg/dL — ABNORMAL HIGH (ref 70–99)
Phosphorus: 4.5 mg/dL (ref 2.5–4.6)
Potassium: 4.3 mmol/L (ref 3.5–5.1)
Sodium: 141 mmol/L (ref 135–145)

## 2018-03-26 LAB — CBC
HCT: 32.4 % — ABNORMAL LOW (ref 39.0–52.0)
Hemoglobin: 10.7 g/dL — ABNORMAL LOW (ref 13.0–17.0)
MCH: 31.8 pg (ref 26.0–34.0)
MCHC: 33 g/dL (ref 30.0–36.0)
MCV: 96.1 fL (ref 80.0–100.0)
PLATELETS: 199 10*3/uL (ref 150–400)
RBC: 3.37 MIL/uL — ABNORMAL LOW (ref 4.22–5.81)
RDW: 14.1 % (ref 11.5–15.5)
WBC: 6.8 10*3/uL (ref 4.0–10.5)
nRBC: 0 % (ref 0.0–0.2)

## 2018-03-26 LAB — GLUCOSE, CAPILLARY
Glucose-Capillary: 107 mg/dL — ABNORMAL HIGH (ref 70–99)
Glucose-Capillary: 133 mg/dL — ABNORMAL HIGH (ref 70–99)
Glucose-Capillary: 105 mg/dL — ABNORMAL HIGH (ref 70–99)
Glucose-Capillary: 181 mg/dL — ABNORMAL HIGH (ref 70–99)

## 2018-03-26 MED ORDER — IPRATROPIUM-ALBUTEROL 0.5-2.5 (3) MG/3ML IN SOLN
3.0000 mL | RESPIRATORY_TRACT | Status: AC
  Administered 2018-03-26: 3 mL via RESPIRATORY_TRACT
  Filled 2018-03-26: qty 3

## 2018-03-26 MED ORDER — CARVEDILOL 3.125 MG PO TABS
3.1250 mg | ORAL_TABLET | Freq: Two times a day (BID) | ORAL | Status: DC
  Administered 2018-03-26 – 2018-03-30 (×8): 3.125 mg via ORAL
  Filled 2018-03-26 (×8): qty 1

## 2018-03-26 MED ORDER — SODIUM CHLORIDE 0.9 % IV SOLN
125.0000 mg | INTRAVENOUS | Status: DC
  Administered 2018-03-26 – 2018-03-30 (×3): 125 mg via INTRAVENOUS
  Filled 2018-03-26 (×4): qty 10

## 2018-03-26 MED ORDER — DORZOLAMIDE HCL-TIMOLOL MAL 2-0.5 % OP SOLN
1.0000 [drp] | Freq: Two times a day (BID) | OPHTHALMIC | Status: DC
  Administered 2018-03-27 – 2018-03-30 (×7): 1 [drp] via OPHTHALMIC
  Filled 2018-03-26: qty 10

## 2018-03-26 NOTE — Progress Notes (Signed)
Patient ID: Elijah White, male   DOB: [birth year], 78 y.o.   MRN: [id number] [facility] Progress Note   Assessment/ Plan:   1.  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    Sluggish response to diuretics overnight with mild shortness of breath earlier this morning-reportedly better than yesterday.  Continue intravenous diuretics at this time with daily assessment for emerging need to start dialysis (which would be through a [facility] as his left BBF is immature). 2.  Acute exacerbation of diastolic heart failure: Continue on current diuretic therapy and plan to monitor input/output and daily weights. 3.  Anemia: This appears to be anemia of chronic kidney disease and possibly exacerbated by recent surgery.    With significant iron deficiency, will give intravenous iron iron. 4.  Hypertension: Blood pressures elevated, back on antihypertensive therapy and will continue to monitor with diuresis. 5.  Secondary hyperparathyroidism: We will follow calcium/phosphorus levels.  Subjective:   Reports some shortness of breath earlier this morning   Objective:   BP (!) 152/66 (BP Location: Right Arm)   Pulse 98   Temp 98.3 F (36.8 C) (Oral)   Resp 19   Ht 5\' 5"  (1.651 m)   Wt 92.5 kg   SpO2 100%   BMI 33.95 kg/m   Intake/Output Summary (Last 24 hours) at 03/26/2018 1001 Last data filed at 03/26/2018 0700 Gross per 24 hour  Intake 3 ml  Output 780 ml  Net -777 ml   Weight change:   Physical Exam: Gen: Comfortably resting in bed, listening to television CVS: Pulse regular rhythm, normal rate, ESM over apex Resp: Diminished breath sounds over bases, fine rales right side Abd: Soft, obese, nontender Ext: 1-2+ lower extremity edema  Imaging: Dg Chest Portable 1 View  Result Date: 03/25/2018 CLINICAL DATA:  Acute shortness of breath.  Patient on dialysis. EXAM: PORTABLE CHEST 1 VIEW  COMPARISON:  09/01/2017 and prior radiographs FINDINGS: Mild cardiomegaly with pulmonary vascular congestion noted. Bilateral perihilar airspace opacities noted, LEFT-greater-than-RIGHT. There may be trace bilateral pleural effusions present. There is no evidence of pneumothorax or acute bony abnormality. IMPRESSION: Bilateral perihilar airspace opacities, LEFT-greater-than-RIGHT, slightly favor asymmetric pulmonary edema over infection but correlate clinically. Mild cardiomegaly with pulmonary vascular congestion and trace bilateral pleural effusions. Electronically Signed   By: Margarette Canada M.D.   On: 03/25/2018 11:51    Labs: BMET Recent Labs  Lab 03/20/18 0856 03/25/18 1150 03/25/18 1437 03/26/18 0356  NA 143 141  --  141  K 4.3 4.5  --  4.3  CL  --  110  --  112*  CO2  --  20*  --  18*  GLUCOSE 98 129*  --  134*  BUN  --  56*  --  61*  CREATININE  --  5.80* 5.80* 5.68*  CALCIUM  --  8.7*  --  8.6*  PHOS  --   --   --  4.5   CBC Recent Labs  Lab 03/20/18 0856 03/25/18 1150 03/25/18 1437 03/26/18 0356  WBC  --  6.9 6.6 6.8  NEUTROABS  --  4.6  --   --   HGB 10.5* 10.6* 10.4* 10.7*  HCT 31.0* 34.7* 33.9* 32.4*  MCV  --  100.6* 100.3* 96.1  PLT  --  193 156 199    Medications:    . amLODipine  10 mg Oral Daily  . atorvastatin  10 mg  Oral q1800  . brimonidine  1 drop Right Eye TID  . calcitRIOL  0.25 mcg Oral Daily  . furosemide  80 mg Intravenous BID  . heparin  5,000 Units Subcutaneous Q8H  . hydrALAZINE  100 mg Oral Q8H  . insulin aspart  0-15 Units Subcutaneous TID WC  . insulin aspart  0-5 Units Subcutaneous QHS  . latanoprost  1 drop Right Eye QHS  . nitroGLYCERIN  0.5 inch Topical Q6H  . pantoprazole  40 mg Oral Daily  . pilocarpine  1 drop Right Eye TID  . sodium chloride flush  3 mL Intravenous Q12H   Elmarie Shiley, MD 03/26/2018, 10:01 AM

## 2018-03-26 NOTE — Progress Notes (Signed)
PROGRESS NOTE                                                                                                                                                                                                             Patient Demographics:    Elijah White, is a 78 y.o. male, DOB - September 05, 1940, TMA:[id number]  Admit date - 03/25/2018   Admitting Physician Thurnell Lose, MD  Outpatient Primary MD for the patient is [name], MD  LOS - 1  Chief Complaint  Patient presents with  . Shortness of Breath  . Facial Swelling       Brief Narrative   Elijah White  is a 78 y.o. male, with history of CKD 5 progressing towards ESRD, chronic diastolic CHF last known EF around 55% on recent echocardiogram, essential hypertension, DM type II, who lives at home by himself ambulates with the help of a walker, comes into the hospital with gradually progressive 1 week history of shortness of breath and orthopnea.  He denies any other medical problems, specifically no fever chills, no productive cough, no chest pain or abdominal pain.  He has also noticed gradual swelling in his arms and legs and multiple pounds of weight gain over the last several weeks.  Came to the ER for evaluation of his shortness of breath he was diagnosed with acute on chronic diastolic CHF, ARF on CKD 5, nephrology was consulted and I was requested to admit.  Patient after wearing some oxygen and sitting up in bed is feeling a whole lot better.   Subjective:    Elijah White today has, No headache, No chest pain, No abdominal pain - No Nausea, No new weakness tingling or numbness, No Cough - improved SOB.     Assessment  & Plan :     1.  Acute on chronic diastolic CHF EF around 55 to 60% on recent echocardiogram.  This is likely due to his progressive CKD 5 now close to ESRD.    He has been kept on high-dose IV Lasix, Nitropaste will add low-dose Coreg, he is diuresing still short of breath and requiring oxygen,  continue salt and fluid restriction, continue oxygen with supplemental nebulizer treatments.  Continue to monitor intake output and electrolytes.  Nephrology on board as well.  2.  Mild borderline elevated troponin.  Due to #1 above from demand ischemia, no chest pain on acute EKG.  No further work-up needed.  3.  ARF on CKD 5.  Gradually  progressive, discussed with nephrologist Dr. Posey Pronto continue diuresis and monitor.  Has left arm AV fistula placed recently likely is not mature yet.  4.  Dyslipidemia.  Continue home dose statin.  5.    Essential hypertension.  Was in poor control has been placed on nitro place, Lasix, higher than home dose hydralazine will add low-dose Coreg and monitor.  6.  Anemia of chronic disease.  Stable anemia panel continue to monitor no need for transfusion.  7. DM type II.  Will hold oral hypoglycemics due to worsening renal function, check A1c, sliding scale and monitor.  Lab Results  Component Value Date   HGBA1C 6.3 (H) 03/25/2018   CBG (last 3)  Recent Labs    03/25/18 1744 03/25/18 2150 03/26/18 0821  GLUCAP 127* 164* 107*       Family Communication  : Daughter at the time of admission  Code Status :  Full  Disposition Plan  :  TBD  Consults  :  Renal  Procedures  :      DVT Prophylaxis  :   Heparin   Lab Results  Component Value Date   PLT 199 03/26/2018    Diet :  Diet Order            Diet renal with fluid restriction Fluid restriction: 1200 mL Fluid; Room service appropriate? Yes; Fluid consistency: Thin  Diet effective now               Inpatient Medications Scheduled Meds: . amLODipine  10 mg Oral Daily  . atorvastatin  10 mg Oral q1800  . brimonidine  1 drop Right Eye TID  . calcitRIOL  0.25 mcg Oral Daily  . furosemide  80 mg Intravenous BID  . heparin  5,000 Units Subcutaneous Q8H  . hydrALAZINE  100 mg Oral Q8H  . insulin aspart  0-15 Units Subcutaneous TID WC  . insulin aspart  0-5 Units Subcutaneous  QHS  . latanoprost  1 drop Right Eye QHS  . nitroGLYCERIN  0.5 inch Topical Q6H  . pantoprazole  40 mg Oral Daily  . pilocarpine  1 drop Right Eye TID  . sodium chloride flush  3 mL Intravenous Q12H   Continuous Infusions: . ferric gluconate (FERRLECIT/NULECIT) IV     PRN Meds:.acetaminophen **OR** acetaminophen, hydrALAZINE, HYDROcodone-acetaminophen, polyethylene glycol  Antibiotics  :   Anti-infectives (From admission, onward)   None          Objective:   Vitals:   03/25/18 2001 03/26/18 0438 03/26/18 0648 03/26/18 0700  BP: (!) 155/65 (!) 154/69  (!) 152/66  Pulse: 87 98  98  Resp: 20 19    Temp: 97.9 F (36.6 C) 98.3 F (36.8 C)    TempSrc: Oral Oral    SpO2: 95% 93% 95% 100%  Weight:      Height:        Wt Readings from Last 3 Encounters:  03/25/18 92.5 kg  03/20/18 90.7 kg  03/03/18 90.7 kg     Intake/Output Summary (Last 24 hours) at 03/26/2018 1122 Last data filed at 03/26/2018 0700 Gross per 24 hour  Intake 3 ml  Output 780 ml  Net -777 ml     Physical Exam  Awake Alert, Oriented X 3, No new F.N deficits, Normal affect [place].AT,PERRAL Supple Neck,No JVD, No cervical lymphadenopathy appriciated.  Symmetrical Chest wall movement, Good air movement bilaterally, CTAB RRR,No Gallops,Rubs or new Murmurs, No Parasternal Heave +ve B.Sounds, Abd Soft, No tenderness, No organomegaly appriciated,  No rebound - guarding or rigidity. No Cyanosis, Clubbing , 2+ edema    Data Review:    CBC Recent Labs  Lab 03/20/18 0856 03/25/18 1150 03/25/18 1437 03/26/18 0356  WBC  --  6.9 6.6 6.8  HGB 10.5* 10.6* 10.4* 10.7*  HCT 31.0* 34.7* 33.9* 32.4*  PLT  --  193 156 199  MCV  --  100.6* 100.3* 96.1  MCH  --  30.7 30.8 31.8  MCHC  --  30.5 30.7 33.0  RDW  --  14.6 14.5 14.1  LYMPHSABS  --  1.2  --   --   MONOABS  --  1.0  --   --   EOSABS  --  0.1  --   --   BASOSABS  --  0.0  --   --     Chemistries  Recent Labs  Lab 03/20/18 0856 03/25/18 1150  03/25/18 1437 03/26/18 0356  NA 143 141  --  141  K 4.3 4.5  --  4.3  CL  --  110  --  112*  CO2  --  20*  --  18*  GLUCOSE 98 129*  --  134*  BUN  --  56*  --  61*  CREATININE  --  5.80* 5.80* 5.68*  CALCIUM  --  8.7*  --  8.6*   ------------------------------------------------------------------------------------------------------------------ No results for input(s): CHOL, HDL, LDLCALC, TRIG, CHOLHDL, LDLDIRECT in the last 72 hours.  Lab Results  Component Value Date   HGBA1C 6.3 (H) 03/25/2018   ------------------------------------------------------------------------------------------------------------------ Recent Labs    03/25/18 1437  TSH 1.169   ------------------------------------------------------------------------------------------------------------------ Recent Labs    03/25/18 1437  VITAMINB12 565  FOLATE 8.3  FERRITIN 269  TIBC 248*  IRON 33*  RETICCTPCT 1.3    Coagulation profile Recent Labs  Lab 03/25/18 1437  INR 1.11    No results for input(s): DDIMER in the last 72 hours.  Cardiac Enzymes Recent Labs  Lab 03/25/18 1150  TROPONINI 0.04*   ------------------------------------------------------------------------------------------------------------------    Component Value Date/Time   BNP 871.0 (H) 03/25/2018 1150    Micro Results No results found for this or any previous visit (from the past 240 hour(s)).  Radiology Reports Dg Chest Portable 1 View  Result Date: 03/25/2018 CLINICAL DATA:  Acute shortness of breath.  Patient on dialysis. EXAM: PORTABLE CHEST 1 VIEW COMPARISON:  09/01/2017 and prior radiographs FINDINGS: Mild cardiomegaly with pulmonary vascular congestion noted. Bilateral perihilar airspace opacities noted, LEFT-greater-than-RIGHT. There may be trace bilateral pleural effusions present. There is no evidence of pneumothorax or acute bony abnormality. IMPRESSION: Bilateral perihilar airspace opacities,  LEFT-greater-than-RIGHT, slightly favor asymmetric pulmonary edema over infection but correlate clinically. Mild cardiomegaly with pulmonary vascular congestion and trace bilateral pleural effusions. Electronically Signed   By: Margarette Canada M.D.   On: 03/25/2018 11:51   Vas [place] Upper Extremity Arterial Duplex  Result Date: 03/03/2018 UPPER EXTREMITY DUPLEX STUDY Indications: Evaluation prior to dialysis access.  Risk Factors: Hypertension, hyperlipidemia, Diabetes, current smoker. Performing Technologist: Delorise Shiner RVT  Examination Guidelines: A complete evaluation includes B-mode imaging, spectral Doppler, color Doppler, and power Doppler as needed of all accessible portions of each vessel. Bilateral testing is considered an integral part of a complete examination. Limited examinations for reoccurring indications may be performed as noted.  Right Pre-Dialysis Findings: +-----------------------+----------+--------------------+---------+--------+ Location               PSV (cm/s)Intralum. Diam. (cm)Waveform Comments +-----------------------+----------+--------------------+---------+--------+ Brachial Antecub. fossa100  0.71                triphasic         +-----------------------+----------+--------------------+---------+--------+ Radial Art at Wrist    105       0.39                triphasic         +-----------------------+----------+--------------------+---------+--------+ Ulnar Art at Wrist     90        0.26                triphasic         +-----------------------+----------+--------------------+---------+--------+ Left Pre-Dialysis Findings: +-----------------------+----------+--------------------+---------+--------+ Location               PSV (cm/s)Intralum. Diam. (cm)Waveform Comments +-----------------------+----------+--------------------+---------+--------+ Brachial Antecub. fossa89        0.70                triphasic          +-----------------------+----------+--------------------+---------+--------+ Radial Art at Wrist    116       0.38                triphasic         +-----------------------+----------+--------------------+---------+--------+ Ulnar Art at Wrist     94        0.23                triphasic         +-----------------------+----------+--------------------+---------+--------+  Summary:  Right: No obstruction visualized in the right upper extremity. Left: No obstruction visualized in the left upper extremity. *See table(s) above for measurements and observations. Electronically signed by Servando Snare MD on 03/03/2018 at 9:29:22 AM.    Final    Vas [place] Upper Ext Vein Mapping (pre-op Avf)  Result Date: 03/03/2018 UPPER EXTREMITY VEIN MAPPING  Indications: Pre-access. Performing Technologist: Delorise Shiner RVT  Examination Guidelines: A complete evaluation includes B-mode imaging, spectral Doppler, color Doppler, and power Doppler as needed of all accessible portions of each vessel. Bilateral testing is considered an integral part of a complete examination. Limited examinations for reoccurring indications may be performed as noted. +-----------------+-------------+----------+--------+ Right Cephalic   Diameter (cm)Depth (cm)Findings +-----------------+-------------+----------+--------+ Shoulder             0.20                        +-----------------+-------------+----------+--------+ Prox upper arm       0.28                        +-----------------+-------------+----------+--------+ Mid upper arm        0.22                        +-----------------+-------------+----------+--------+ Dist upper arm       0.23                        +-----------------+-------------+----------+--------+ Antecubital fossa    0.31                        +-----------------+-------------+----------+--------+ Prox forearm         0.24                         +-----------------+-------------+----------+--------+ Mid forearm          0.23                        +-----------------+-------------+----------+--------+  Dist forearm         0.22                        +-----------------+-------------+----------+--------+ +-----------------+-------------+----------+-------------------------------+ Right Basilic    Diameter (cm)Depth (cm)           Findings             +-----------------+-------------+----------+-------------------------------+ Prox upper arm       0.50                                               +-----------------+-------------+----------+-------------------------------+ Mid upper arm        0.32                                               +-----------------+-------------+----------+-------------------------------+ Dist upper arm       0.34                                               +-----------------+-------------+----------+-------------------------------+ Antecubital fossa    0.24                                               +-----------------+-------------+----------+-------------------------------+ Prox forearm                            subdivides to multiple branches +-----------------+-------------+----------+-------------------------------+ +-----------------+-------------+----------+--------+ Left Cephalic    Diameter (cm)Depth (cm)Findings +-----------------+-------------+----------+--------+ Shoulder             0.22                        +-----------------+-------------+----------+--------+ Prox upper arm       0.24                        +-----------------+-------------+----------+--------+ Mid upper arm        0.30                        +-----------------+-------------+----------+--------+ Dist upper arm       0.31                        +-----------------+-------------+----------+--------+ Antecubital fossa    0.32                         +-----------------+-------------+----------+--------+ Prox forearm         0.23                        +-----------------+-------------+----------+--------+ Mid forearm          0.21                        +-----------------+-------------+----------+--------+ Dist forearm         0.26                        +-----------------+-------------+----------+--------+ +-----------------+-------------+----------+--------+  Left Basilic     Diameter (cm)Depth (cm)Findings +-----------------+-------------+----------+--------+ Mid upper arm        0.38                        +-----------------+-------------+----------+--------+ Dist upper arm       0.30                        +-----------------+-------------+----------+--------+ Antecubital fossa    0.28                        +-----------------+-------------+----------+--------+ Prox forearm         0.29                        +-----------------+-------------+----------+--------+ Summary: Right: Basilic and cephalic veins are identified with diameters as        listed above. Left: Basilic and cephalic veins are identified with diameters as       listed above. *See table(s) above for measurements and observations.  Diagnosing physician: Servando Snare MD Electronically signed by Servando Snare MD on 03/03/2018 at 9:28:38 AM.    Final     Time Spent in minutes  30   Lala Lund M.D on 03/26/2018 at 11:22 AM  To page go to [email or web address] - password [facility]

## 2018-03-26 NOTE — Progress Notes (Signed)
Pt complaining of SOB. Pt with O2 sat of 95% on RA and sat up. Pt also complaining of reflux. Provider Candiss Norse was paged. Awaiting further orders. Will continue to monitor pt.

## 2018-03-27 LAB — MAGNESIUM: Magnesium: 1.7 mg/dL (ref 1.7–2.4)

## 2018-03-27 LAB — BASIC METABOLIC PANEL
Anion gap: 9 (ref 5–15)
BUN: 62 mg/dL — ABNORMAL HIGH (ref 8–23)
CO2: 21 mmol/L — ABNORMAL LOW (ref 22–32)
Calcium: 8.5 mg/dL — ABNORMAL LOW (ref 8.9–10.3)
Chloride: 111 mmol/L (ref 98–111)
Creatinine, Ser: 5.78 mg/dL — ABNORMAL HIGH (ref 0.61–1.24)
GFR calc Af Amer: 10 mL/min — ABNORMAL LOW (ref >60)
GFR calc non Af Amer: 9 mL/min — ABNORMAL LOW (ref >60)
Glucose, Bld: 132 mg/dL — ABNORMAL HIGH (ref 70–99)
Potassium: 4.3 mmol/L (ref 3.5–5.1)
Sodium: 141 mmol/L (ref 135–145)

## 2018-03-27 LAB — GLUCOSE, CAPILLARY
Glucose-Capillary: 122 mg/dL — ABNORMAL HIGH (ref 70–99)
Glucose-Capillary: 121 mg/dL — ABNORMAL HIGH (ref 70–99)
Glucose-Capillary: 140 mg/dL — ABNORMAL HIGH (ref 70–99)
Glucose-Capillary: 114 mg/dL — ABNORMAL HIGH (ref 70–99)

## 2018-03-27 MED ORDER — GABAPENTIN 100 MG PO CAPS
100.0000 mg | ORAL_CAPSULE | Freq: Every day | ORAL | Status: DC
  Administered 2018-03-27 – 2018-03-29 (×3): 100 mg via ORAL
  Filled 2018-03-27 (×3): qty 1

## 2018-03-27 MED ORDER — FUROSEMIDE 10 MG/ML IJ SOLN
120.0000 mg | Freq: Three times a day (TID) | INTRAMUSCULAR | Status: DC
  Administered 2018-03-27 (×2): 120 mg via INTRAVENOUS
  Filled 2018-03-27 (×2): qty 12

## 2018-03-27 NOTE — Evaluation (Signed)
Physical Therapy Evaluation Patient Details Name: Elijah White MRN: [id number] DOB: September 06, 1940 Today's Date: 03/27/2018   History of Present Illness  78 y.o. male, with history of CKD 5 progressing towards ESRD, chronic diastolic CHF last known EF around 55% on recent echocardiogram, essential hypertension, DM type II, who lives at home by himself ambulates with the help of a walker, comes into the hospital with gradually progressive 1 week history of shortness of breath and orthopnea.  Dx of acute on chronic CHF   Clinical Impression  Pt admitted with above diagnosis. Pt currently with functional limitations due to the deficits listed below (see PT Problem List). Pt ambulated 140' with RW, SaO2 94% on room air, no dyspnea, min assist to guide RW 2* pt's poor vision, distance limited by fatigue. Pt reports he is able to walk longer distances at baseline. He also reports he's only feeling short of breath when in supine position. Good progress expected.  Pt will benefit from skilled PT to increase their independence and safety with mobility to allow discharge to the venue listed below.       Follow Up Recommendations Home health PT    Equipment Recommendations  None recommended by PT    Recommendations for Other Services       Precautions / Restrictions Precautions Precautions: Other (comment) Precaution Comments: poor vision, daughter drives  Restrictions Weight Bearing Restrictions: No      Mobility  Bed Mobility Overal bed mobility: Modified Independent             General bed mobility comments: HOB up, used rail  Transfers Overall transfer level: Needs assistance Equipment used: Rolling walker (2 wheeled) Transfers: Sit to/from Stand Sit to Stand: Min guard         General transfer comment: manual cues for hand placement on RW 2* poor vision  Ambulation/Gait Ambulation/Gait assistance: Min assist;Min guard Gait Distance (Feet): 140 Feet Assistive device:  Rolling walker (2 wheeled) Gait Pattern/deviations: Step-through pattern;Decreased stride length Gait velocity: WFL   General Gait Details: min A to steer RW with turns and for obstacles 2* poor vision, no loss of balance, SaO2 94% on room air, no dyspnea  Stairs            Wheelchair Mobility    Modified Rankin (Stroke Patients Only)       Balance Overall balance assessment: Modified Independent                                           Pertinent Vitals/Pain Pain Assessment: No/denies pain    Home Living Family/patient expects to be discharged to:: Private residence Living Arrangements: Alone   Type of Home: House Home Access: Level entry     Home Layout: One level Home Equipment: Environmental consultant - 2 wheels      Prior Function Level of Independence: Independent with assistive device(s)         Comments: uses RW when going out of home     Hand Dominance        Extremity/Trunk Assessment   Upper Extremity Assessment Upper Extremity Assessment: Overall WFL for tasks assessed    Lower Extremity Assessment Lower Extremity Assessment: Overall WFL for tasks assessed(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/o peripheral neuropathy, sensation intact to light touch B feet; B knee ext +4/5)    Cervical / Trunk Assessment Cervical / Trunk Assessment: Normal  Communication  Communication: No difficulties  Cognition Arousal/Alertness: Awake/alert Behavior During Therapy: WFL for tasks assessed/performed Overall Cognitive Status: Within Functional Limits for tasks assessed                                        General Comments      Exercises     Assessment/Plan    PT Assessment Patient needs continued PT services  PT Problem List Decreased activity tolerance;Decreased mobility       PT Treatment Interventions Gait training;Balance training    PT Goals (Current goals can be found in the Care Plan section)  Acute Rehab PT Goals Patient Stated  Goal: be able to walk farther, not get short of breath when lying down PT Goal Formulation: With patient Time For Goal Achievement: 04/10/18 Potential to Achieve Goals: Good    Frequency Min 3X/week   Barriers to discharge        Co-evaluation               AM-PAC PT "6 Clicks" Mobility  Outcome Measure Help needed turning from your back to your side while in a flat bed without using bedrails?: A Little Help needed moving from lying on your back to sitting on the side of a flat bed without using bedrails?: A Little Help needed moving to and from a bed to a chair (including a wheelchair)?: A Little Help needed standing up from a chair using your arms (e.g., wheelchair or bedside chair)?: A Little Help needed to walk in hospital room?: A Little Help needed climbing 3-5 steps with a railing? : A Little 6 Click Score: 18    End of Session Equipment Utilized During Treatment: Gait belt Activity Tolerance: Patient tolerated treatment well Patient left: in bed;with bed alarm set;with call bell/phone within reach Nurse Communication: Mobility status;Other (comment)(nasal canula left off 2* SaO2 96% on room air at rest) PT Visit Diagnosis: Difficulty in walking, not elsewhere classified (R26.2)    Time: 1134-1150 PT Time Calculation (min) (ACUTE ONLY): 16 min   Charges:   PT Evaluation $PT Eval Low Complexity: 1 Low          Blondell Reveal Kistler PT 03/27/2018  Acute Rehabilitation Services Pager [phone] Office [phone]

## 2018-03-27 NOTE — Progress Notes (Signed)
Received call from Dr Joelyn Oms, nephrologist. New order placed for ultrasound in Epic.

## 2018-03-27 NOTE — Progress Notes (Signed)
PROGRESS NOTE                                                                                                                                                                                                             Patient Demographics:    Elijah White, is a 78 y.o. male, DOB - 1941/01/19, DSK:[id number]  Admit date - 03/25/2018   Admitting Physician Thurnell Lose, MD  Outpatient Primary MD for the patient is [name], MD  LOS - 2  Chief Complaint  Patient presents with  . Shortness of Breath  . Facial Swelling       Brief Narrative   Elijah White  is a 78 y.o. male, with history of CKD 5 progressing towards ESRD, chronic diastolic CHF last known EF around 55% on recent echocardiogram, essential hypertension, DM type II, who lives at home by himself ambulates with the help of a walker, comes into the hospital with gradually progressive 1 week history of shortness of breath and orthopnea.  He denies any other medical problems, specifically no fever chills, no productive cough, no chest pain or abdominal pain.  He has also noticed gradual swelling in his arms and legs and multiple pounds of weight gain over the last several weeks.  Came to the ER for evaluation of his shortness of breath he was diagnosed with acute on chronic diastolic CHF, ARF on CKD 5, nephrology was consulted and I was requested to admit.  Patient after wearing some oxygen and sitting up in bed is feeling a whole lot better.   Subjective:   Patient in bed, appears comfortable, denies any headache, no fever, no chest pain or pressure, no shortness of breath , no abdominal pain. No focal weakness.    Assessment  & Plan :     1.  Acute on chronic diastolic CHF EF around 55 to 60% on recent echocardiogram done late in 2019.    Combination of underlying CKD 5 with minimal urine output on a daily basis, he has been kept on high dose of IV Lasix, Nitropaste, low-dose oral Coreg, supplemental  oxygen.  Continue salt and fluid restriction.  Continue supportive care with oxygen nebulizer treatments.  Clinically improving still has considerable edema.  Nephrology on board as well.  2.  Mild borderline elevated troponin.  Due to #1 above from demand ischemia, no chest pain on acute EKG.  No further work-up needed.  3.  ARF on CKD 5.  Gradually progressive, discussed with nephrologist  Dr. Posey Pronto continue diuresis and monitor.  Has left arm AV fistula placed recently likely is not mature yet.  4.  Dyslipidemia.  Continue home dose statin.  5.  Essential hypertension.  Was in poor control has been placed on nitro place, Lasix, higher than home dose hydralazine will add low-dose Coreg and monitor.  6.  Anemia of chronic disease.  Stable anemia panel continue to monitor no need for transfusion.  7. DM type II.  Will hold oral hypoglycemics due to worsening renal function, check A1c, sliding scale and monitor.  Lab Results  Component Value Date   HGBA1C 6.3 (H) 03/25/2018   CBG (last 3)  Recent Labs    03/26/18 1819 03/26/18 2136 03/27/18 0815  GLUCAP 105* 181* 52*       Family Communication  : Daughter at the time of admission and again on 03/27/2018  Code Status :  Full  Disposition Plan  : SNF in the next 1 to 2 days  Consults  :  Renal  Procedures  :    TTE - 09/2017 - Left ventricle: The cavity size was normal. Wall thickness was increased in a pattern of mild LVH. Systolic function was  vigorous. The estimated ejection fraction was in the range of 65% to 70%. Wall motion was normal; there were no regional wall motion abnormalities. Doppler parameters are consistent with  abnormal left ventricular relaxation (grade 1 diastolicdysfunction).  DVT Prophylaxis  :   Heparin   Lab Results  Component Value Date   PLT 199 03/26/2018    Diet :  Diet Order            Diet renal with fluid restriction Fluid restriction: 1200 mL Fluid; Room service appropriate? Yes;  Fluid consistency: Thin  Diet effective now               Inpatient Medications  Scheduled Meds:  . amLODipine  10 mg Oral Daily  . atorvastatin  10 mg Oral q1800  . brimonidine  1 drop Right Eye TID  . calcitRIOL  0.25 mcg Oral Daily  . carvedilol  3.125 mg Oral BID WC  . dorzolamide-timolol  1 drop Right Eye BID  . furosemide  80 mg Intravenous BID  . gabapentin  100 mg Oral QHS  . heparin  5,000 Units Subcutaneous Q8H  . hydrALAZINE  100 mg Oral Q8H  . insulin aspart  0-15 Units Subcutaneous TID WC  . insulin aspart  0-5 Units Subcutaneous QHS  . latanoprost  1 drop Right Eye QHS  . nitroGLYCERIN  0.5 inch Topical Q6H  . pantoprazole  40 mg Oral Daily  . pilocarpine  1 drop Right Eye TID  . sodium chloride flush  3 mL Intravenous Q12H   Continuous Infusions: . ferric gluconate (FERRLECIT/NULECIT) IV Stopped (03/26/18 2223)   PRN Meds:.acetaminophen **OR** acetaminophen, hydrALAZINE, HYDROcodone-acetaminophen, polyethylene glycol  Antibiotics  :   Anti-infectives (From admission, onward)   None        Objective:   Vitals:   03/26/18 1630 03/26/18 2139 03/27/18 0038 03/27/18 0357  BP: (!) 152/70 (!) 149/65 (!) 146/62 (!) 144/66  Pulse: 94 86  90  Resp: 20 18 20 18   Temp: 97.8 F (36.6 C) 98.2 F (36.8 C) 98.2 F (36.8 C) 98.4 F (36.9 C)  TempSrc: Oral Oral Oral Oral  SpO2: 98% 97% 95% 96%  Weight:      Height:        Wt Readings from Last 3 Encounters:  03/25/18 92.5 kg  03/20/18 90.7 kg  03/03/18 90.7 kg     Intake/Output Summary (Last 24 hours) at 03/27/2018 0948 Last data filed at 03/27/2018 0940 Gross per 24 hour  Intake 240 ml  Output 1850 ml  Net -1610 ml     Physical Exam  Awake Alert, Oriented X 3, No new F.N deficits, Normal affect [place].AT,PERRAL Supple Neck,No JVD, No cervical lymphadenopathy appriciated.  Symmetrical Chest wall movement, Good air movement bilaterally, CTAB RRR,No Gallops, Rubs or new Murmurs, No Parasternal  Heave +ve B.Sounds, Abd Soft, No tenderness, No organomegaly appriciated, No rebound - guarding or rigidity. No Cyanosis, Clubbing, 1+ edema, No new Rash or bruise     Data Review:    CBC Recent Labs  Lab 03/25/18 1150 03/25/18 1437 03/26/18 0356  WBC 6.9 6.6 6.8  HGB 10.6* 10.4* 10.7*  HCT 34.7* 33.9* 32.4*  PLT 193 156 199  MCV 100.6* 100.3* 96.1  MCH 30.7 30.8 31.8  MCHC 30.5 30.7 33.0  RDW 14.6 14.5 14.1  LYMPHSABS 1.2  --   --   MONOABS 1.0  --   --   EOSABS 0.1  --   --   BASOSABS 0.0  --   --     Chemistries  Recent Labs  Lab 03/25/18 1150 03/25/18 1437 03/26/18 0356 03/27/18 0419  NA 141  --  141 141  K 4.5  --  4.3 4.3  CL 110  --  112* 111  CO2 20*  --  18* 21*  GLUCOSE 129*  --  134* 132*  BUN 56*  --  61* 62*  CREATININE 5.80* 5.80* 5.68* 5.78*  CALCIUM 8.7*  --  8.6* 8.5*  MG  --   --   --  1.7   ------------------------------------------------------------------------------------------------------------------ No results for input(s): CHOL, HDL, LDLCALC, TRIG, CHOLHDL, LDLDIRECT in the last 72 hours.  Lab Results  Component Value Date   HGBA1C 6.3 (H) 03/25/2018   ------------------------------------------------------------------------------------------------------------------ Recent Labs    03/25/18 1437  TSH 1.169   ------------------------------------------------------------------------------------------------------------------ Recent Labs    03/25/18 1437  VITAMINB12 565  FOLATE 8.3  FERRITIN 269  TIBC 248*  IRON 33*  RETICCTPCT 1.3    Coagulation profile Recent Labs  Lab 03/25/18 1437  INR 1.11    No results for input(s): DDIMER in the last 72 hours.  Cardiac Enzymes Recent Labs  Lab 03/25/18 1150  TROPONINI 0.04*   ------------------------------------------------------------------------------------------------------------------    Component Value Date/Time   BNP 871.0 (H) 03/25/2018 1150    Micro Results No  results found for this or any previous visit (from the past 240 hour(s)).  Radiology Reports Dg Chest Portable 1 View  Result Date: 03/25/2018 CLINICAL DATA:  Acute shortness of breath.  Patient on dialysis. EXAM: PORTABLE CHEST 1 VIEW COMPARISON:  09/01/2017 and prior radiographs FINDINGS: Mild cardiomegaly with pulmonary vascular congestion noted. Bilateral perihilar airspace opacities noted, LEFT-greater-than-RIGHT. There may be trace bilateral pleural effusions present. There is no evidence of pneumothorax or acute bony abnormality. IMPRESSION: Bilateral perihilar airspace opacities, LEFT-greater-than-RIGHT, slightly favor asymmetric pulmonary edema over infection but correlate clinically. Mild cardiomegaly with pulmonary vascular congestion and trace bilateral pleural effusions. Electronically Signed   By: Margarette Canada M.D.   On: 03/25/2018 11:51   Vas [place] Upper Extremity Arterial Duplex  Result Date: 03/03/2018 UPPER EXTREMITY DUPLEX STUDY Indications: Evaluation prior to dialysis access.  Risk Factors: Hypertension, hyperlipidemia, Diabetes, current smoker. Performing Technologist: Delorise Shiner RVT  Examination Guidelines: A complete evaluation includes B-mode  imaging, spectral Doppler, color Doppler, and power Doppler as needed of all accessible portions of each vessel. Bilateral testing is considered an integral part of a complete examination. Limited examinations for reoccurring indications may be performed as noted.  Right Pre-Dialysis Findings: +-----------------------+----------+--------------------+---------+--------+ Location               PSV (cm/s)Intralum. Diam. (cm)Waveform Comments +-----------------------+----------+--------------------+---------+--------+ Brachial Antecub. fossa100       0.71                triphasic         +-----------------------+----------+--------------------+---------+--------+ Radial Art at Wrist    105       0.39                triphasic          +-----------------------+----------+--------------------+---------+--------+ Ulnar Art at Wrist     90        0.26                triphasic         +-----------------------+----------+--------------------+---------+--------+ Left Pre-Dialysis Findings: +-----------------------+----------+--------------------+---------+--------+ Location               PSV (cm/s)Intralum. Diam. (cm)Waveform Comments +-----------------------+----------+--------------------+---------+--------+ Brachial Antecub. fossa89        0.70                triphasic         +-----------------------+----------+--------------------+---------+--------+ Radial Art at Wrist    116       0.38                triphasic         +-----------------------+----------+--------------------+---------+--------+ Ulnar Art at Wrist     94        0.23                triphasic         +-----------------------+----------+--------------------+---------+--------+  Summary:  Right: No obstruction visualized in the right upper extremity. Left: No obstruction visualized in the left upper extremity. *See table(s) above for measurements and observations. Electronically signed by Servando Snare MD on 03/03/2018 at 9:29:22 AM.    Final    Vas [place] Upper Ext Vein Mapping (pre-op Avf)  Result Date: 03/03/2018 UPPER EXTREMITY VEIN MAPPING  Indications: Pre-access. Performing Technologist: Delorise Shiner RVT  Examination Guidelines: A complete evaluation includes B-mode imaging, spectral Doppler, color Doppler, and power Doppler as needed of all accessible portions of each vessel. Bilateral testing is considered an integral part of a complete examination. Limited examinations for reoccurring indications may be performed as noted. +-----------------+-------------+----------+--------+ Right Cephalic   Diameter (cm)Depth (cm)Findings +-----------------+-------------+----------+--------+ Shoulder             0.20                         +-----------------+-------------+----------+--------+ Prox upper arm       0.28                        +-----------------+-------------+----------+--------+ Mid upper arm        0.22                        +-----------------+-------------+----------+--------+ Dist upper arm       0.23                        +-----------------+-------------+----------+--------+ Antecubital fossa    0.31                        +-----------------+-------------+----------+--------+  Prox forearm         0.24                        +-----------------+-------------+----------+--------+ Mid forearm          0.23                        +-----------------+-------------+----------+--------+ Dist forearm         0.22                        +-----------------+-------------+----------+--------+ +-----------------+-------------+----------+-------------------------------+ Right Basilic    Diameter (cm)Depth (cm)           Findings             +-----------------+-------------+----------+-------------------------------+ Prox upper arm       0.50                                               +-----------------+-------------+----------+-------------------------------+ Mid upper arm        0.32                                               +-----------------+-------------+----------+-------------------------------+ Dist upper arm       0.34                                               +-----------------+-------------+----------+-------------------------------+ Antecubital fossa    0.24                                               +-----------------+-------------+----------+-------------------------------+ Prox forearm                            subdivides to multiple branches +-----------------+-------------+----------+-------------------------------+ +-----------------+-------------+----------+--------+ Left Cephalic    Diameter (cm)Depth (cm)Findings  +-----------------+-------------+----------+--------+ Shoulder             0.22                        +-----------------+-------------+----------+--------+ Prox upper arm       0.24                        +-----------------+-------------+----------+--------+ Mid upper arm        0.30                        +-----------------+-------------+----------+--------+ Dist upper arm       0.31                        +-----------------+-------------+----------+--------+ Antecubital fossa    0.32                        +-----------------+-------------+----------+--------+ Prox forearm         0.23                        +-----------------+-------------+----------+--------+  Mid forearm          0.21                        +-----------------+-------------+----------+--------+ Dist forearm         0.26                        +-----------------+-------------+----------+--------+ +-----------------+-------------+----------+--------+ Left Basilic     Diameter (cm)Depth (cm)Findings +-----------------+-------------+----------+--------+ Mid upper arm        0.38                        +-----------------+-------------+----------+--------+ Dist upper arm       0.30                        +-----------------+-------------+----------+--------+ Antecubital fossa    0.28                        +-----------------+-------------+----------+--------+ Prox forearm         0.29                        +-----------------+-------------+----------+--------+ Summary: Right: Basilic and cephalic veins are identified with diameters as        listed above. Left: Basilic and cephalic veins are identified with diameters as       listed above. *See table(s) above for measurements and observations.  Diagnosing physician: Servando Snare MD Electronically signed by Servando Snare MD on 03/03/2018 at 9:28:38 AM.    Final     Time Spent in minutes  30   Lala Lund M.D on 03/27/2018 at 9:48  AM  To page go to [email or web address] - password [facility]

## 2018-03-27 NOTE — Progress Notes (Signed)
Patient's daughter, [name], stated that Dr Posey Pronto said that he would order ultrasound of right arm due to swelling. No order received, no mention in note. Paged nephrologist to verify.

## 2018-03-27 NOTE — Progress Notes (Signed)
Pharmacist Heart Failure Core Measure Documentation  Assessment: Elijah White has an EF documented as diastolic. Last EF 09/02/17 as 65-70%  Rationale: Heart failure patients with left ventricular systolic dysfunction (LVSD) and an EF < 40% should be prescribed an angiotensin converting enzyme inhibitor (ACEI) or angiotensin receptor blocker (ARB) at discharge unless a contraindication is documented in the medical record.  This patient is not currently on an ACEI or ARB for HF.  This note is being placed in the record in order to provide documentation that a contraindication to the use of these agents is present for this encounter.  ACE Inhibitor or Angiotensin Receptor Blocker is contraindicated (specify all that apply)  []   ACEI allergy AND ARB allergy []   Angioedema []   Moderate or severe aortic stenosis []   Hyperkalemia []   Hypotension []   Renal artery stenosis [x]   Worsening renal function, preexisting renal disease or dysfunction  [name] S. Alford Highland, PharmD, [place] Clinical Staff Pharmacist [place], [place] 03/27/2018 4:08 PM

## 2018-03-27 NOTE — Progress Notes (Signed)
Patient ID: Elijah White, male   DOB: November 19, 1940, 78 y.o.   MRN: [id number] [facility] Progress Note   Assessment/ Plan:   1.  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  With net negative fluid balance noted overnight but continued problems with dyspnea reported, I will increase furosemide to 120 mg 3 times a day with plans to transition to oral diuretics when symptomatically better.  Does not have any acute indications for dialysis at this time (if required, this would be through a [facility] as his left BBF is immature). 2.  Acute exacerbation of diastolic heart failure: Continue on current diuretic therapy and plan to monitor input/output and daily weights. 3.  Anemia: This appears to be anemia of chronic kidney disease and possibly exacerbated by recent surgery.    Getting intravenous iron 4.  Hypertension: Blood pressures elevated, back on antihypertensive therapy and will continue to monitor with diuresis. 5.  Secondary hyperparathyroidism: We will follow calcium/phosphorus levels.  Subjective:   Reports some shortness of breath particularly with activity   Objective:   BP (!) 144/66 (BP Location: Right Arm)   Pulse 90   Temp 98.4 F (36.9 C) (Oral)   Resp 18   Ht 5\' 5"  (1.651 m)   Wt 92.5 kg   SpO2 96%   BMI 33.95 kg/m   Intake/Output Summary (Last 24 hours) at 03/27/2018 1050 Last data filed at 03/27/2018 0940 Gross per 24 hour  Intake 240 ml  Output 1850 ml  Net -1610 ml   Weight change:   Physical Exam: Gen: Comfortably resting in bed, listening to television CVS: Pulse regular rhythm, normal rate, ESM over apex Resp: Fine rales over both bases, no rhonchi Abd: Soft, obese, nontender Ext: 2+ lower extremity edema, 2+ right upper extremity edema, 1+ left upper extremity edema  Imaging: Dg Chest Portable 1 View  Result Date: 03/25/2018 CLINICAL DATA:  Acute shortness  of breath.  Patient on dialysis. EXAM: PORTABLE CHEST 1 VIEW COMPARISON:  09/01/2017 and prior radiographs FINDINGS: Mild cardiomegaly with pulmonary vascular congestion noted. Bilateral perihilar airspace opacities noted, LEFT-greater-than-RIGHT. There may be trace bilateral pleural effusions present. There is no evidence of pneumothorax or acute bony abnormality. IMPRESSION: Bilateral perihilar airspace opacities, LEFT-greater-than-RIGHT, slightly favor asymmetric pulmonary edema over infection but correlate clinically. Mild cardiomegaly with pulmonary vascular congestion and trace bilateral pleural effusions. Electronically Signed   By: Margarette Canada M.D.   On: 03/25/2018 11:51    Labs: BMET Recent Labs  Lab 03/25/18 1150 03/25/18 1437 03/26/18 0356 03/27/18 0419  NA 141  --  141 141  K 4.5  --  4.3 4.3  CL 110  --  112* 111  CO2 20*  --  18* 21*  GLUCOSE 129*  --  134* 132*  BUN 56*  --  61* 62*  CREATININE 5.80* 5.80* 5.68* 5.78*  CALCIUM 8.7*  --  8.6* 8.5*  PHOS  --   --  4.5  --    CBC Recent Labs  Lab 03/25/18 1150 03/25/18 1437 03/26/18 0356  WBC 6.9 6.6 6.8  NEUTROABS 4.6  --   --   HGB 10.6* 10.4* 10.7*  HCT 34.7* 33.9* 32.4*  MCV 100.6* 100.3* 96.1  PLT 193 156 199    Medications:    . amLODipine  10 mg Oral Daily  . atorvastatin  10 mg Oral q1800  . brimonidine  1 drop Right Eye  TID  . calcitRIOL  0.25 mcg Oral Daily  . carvedilol  3.125 mg Oral BID WC  . dorzolamide-timolol  1 drop Right Eye BID  . furosemide  80 mg Intravenous BID  . gabapentin  100 mg Oral QHS  . heparin  5,000 Units Subcutaneous Q8H  . hydrALAZINE  100 mg Oral Q8H  . insulin aspart  0-15 Units Subcutaneous TID WC  . insulin aspart  0-5 Units Subcutaneous QHS  . latanoprost  1 drop Right Eye QHS  . nitroGLYCERIN  0.5 inch Topical Q6H  . pantoprazole  40 mg Oral Daily  . pilocarpine  1 drop Right Eye TID  . sodium chloride flush  3 mL Intravenous Q12H   Elmarie Shiley, MD 03/27/2018,  10:50 AM

## 2018-03-28 ENCOUNTER — Inpatient Hospital Stay (HOSPITAL_COMMUNITY): Payer: Medicare Other

## 2018-03-28 DIAGNOSIS — M7989 Other specified soft tissue disorders: Secondary | ICD-10-CM

## 2018-03-28 LAB — GLUCOSE, CAPILLARY
GLUCOSE-CAPILLARY: 154 mg/dL — AB (ref 70–99)
Glucose-Capillary: 143 mg/dL — ABNORMAL HIGH (ref 70–99)
Glucose-Capillary: 175 mg/dL — ABNORMAL HIGH (ref 70–99)

## 2018-03-28 LAB — BASIC METABOLIC PANEL
Anion gap: 10 (ref 5–15)
BUN: 65 mg/dL — ABNORMAL HIGH (ref 8–23)
CO2: 21 mmol/L — ABNORMAL LOW (ref 22–32)
CREATININE: 6.21 mg/dL — AB (ref 0.61–1.24)
Calcium: 8.7 mg/dL — ABNORMAL LOW (ref 8.9–10.3)
Chloride: 110 mmol/L (ref 98–111)
GFR calc Af Amer: 9 mL/min — ABNORMAL LOW (ref >60)
GFR calc non Af Amer: 8 mL/min — ABNORMAL LOW (ref >60)
Glucose, Bld: 108 mg/dL — ABNORMAL HIGH (ref 70–99)
Potassium: 4 mmol/L (ref 3.5–5.1)
SODIUM: 141 mmol/L (ref 135–145)

## 2018-03-28 MED ORDER — FUROSEMIDE 80 MG PO TABS
80.0000 mg | ORAL_TABLET | Freq: Two times a day (BID) | ORAL | Status: DC
  Administered 2018-03-28 – 2018-03-30 (×4): 80 mg via ORAL
  Filled 2018-03-28 (×4): qty 1

## 2018-03-28 NOTE — Progress Notes (Signed)
Physical Therapy Treatment Patient Details Name: Elijah White MRN: [id number] DOB: 1941-01-26 Today's Date: 03/28/2018    History of Present Illness 78 y.o. male, with history of CKD 5 progressing towards ESRD, chronic diastolic CHF last known EF around 55% on recent echocardiogram, essential hypertension, DM type II, who lives at home by himself ambulates with the help of a walker, comes into the hospital with gradually progressive 1 week history of shortness of breath and orthopnea.  Dx of acute on chronic CHF     PT Comments    Pt making good progress. Should be fairly independent with return to the familiar environment of his home.    Follow Up Recommendations  Home health PT     Equipment Recommendations  None recommended by PT    Recommendations for Other Services       Precautions / Restrictions Precautions Precautions: Other (comment) Precaution Comments: poor vision, daughter drives  Restrictions Weight Bearing Restrictions: No    Mobility  Bed Mobility Overal bed mobility: Needs Assistance Bed Mobility: Supine to Sit     Supine to sit: Min assist     General bed mobility comments: Assist to pull up to elevate trunk. Bed flat.  Transfers Overall transfer level: Needs assistance Equipment used: Rolling walker (2 wheeled) Transfers: Sit to/from Stand Sit to Stand: Supervision         General transfer comment: supervision for safety  Ambulation/Gait Ambulation/Gait assistance: Min guard Gait Distance (Feet): 200 Feet Assistive device: Rolling walker (2 wheeled) Gait Pattern/deviations: Step-through pattern;Decreased stride length Gait velocity: decr Gait velocity interpretation: 1.31 - 2.62 ft/sec, indicative of limited community ambulator General Gait Details: min guard for guidance due to poor vision   Stairs             Wheelchair Mobility    Modified Rankin (Stroke Patients Only)       Balance Overall balance assessment: No  apparent balance deficits (not formally assessed)                                          Cognition Arousal/Alertness: Awake/alert Behavior During Therapy: WFL for tasks assessed/performed Overall Cognitive Status: Within Functional Limits for tasks assessed                                        Exercises      General Comments        Pertinent Vitals/Pain Pain Assessment: No/denies pain    Home Living                      Prior Function            PT Goals (current goals can now be found in the care plan section) Progress towards PT goals: Progressing toward goals    Frequency    Min 3X/week      PT Plan Current plan remains appropriate    Co-evaluation              AM-PAC PT "6 Clicks" Mobility   Outcome Measure  Help needed turning from your back to your side while in a flat bed without using bedrails?: A Little Help needed moving from lying on your back to sitting on the side of a flat bed without using bedrails?:  A Little Help needed moving to and from a bed to a chair (including a wheelchair)?: A Little Help needed standing up from a chair using your arms (e.g., wheelchair or bedside chair)?: A Little Help needed to walk in hospital room?: A Little Help needed climbing 3-5 steps with a railing? : A Little 6 Click Score: 18    End of Session   Activity Tolerance: Patient tolerated treatment well Patient left: with call bell/phone within reach;in chair;with chair alarm set Nurse Communication: Mobility status;Other (comment) PT Visit Diagnosis: Difficulty in walking, not elsewhere classified (R26.2)     Time: 8657-8469 PT Time Calculation (min) (ACUTE ONLY): 15 min  Charges:  $Gait Training: 8-22 mins                     [place] Pager [phone] Office [place] 03/28/2018, 1:35 PM

## 2018-03-28 NOTE — Progress Notes (Signed)
Patient ID: Elijah White, male   DOB: [birth year], 78 y.o.   MRN: [id number] [facility] Progress Note   Assessment/ Plan:   1.  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  Decent urine output with escalation of diuretic dosing yesterday but concomitant rise of creatinine noted-we will restart oral furosemide this evening and continue to monitor labs.  Does not have any acute indications for dialysis at this time (if required, this would be through a [facility] as his left BBF is immature). 2.  Acute exacerbation of diastolic heart failure: Transition to oral diuretics today and continue to monitor input/output and daily weights. 3.  Anemia: This appears to be anemia of chronic kidney disease and possibly exacerbated by recent surgery.    Getting intravenous iron 4.  Hypertension: Blood pressures elevated, back on antihypertensive therapy and will continue to monitor with diuresis. 5.  Secondary hyperparathyroidism: Calcium and phosphorus levels currently within acceptable range  Subjective:   Reports intermittent shortness of breath-tolerated walking to the nursing station yesterday without major difficulty but having shortness of breath today while on commode.   Objective:   BP 137/61 (BP Location: Right Arm)   Pulse 97   Temp 98.4 F (36.9 C) (Oral)   Resp 20   Ht 5\' 5"  (1.651 m)   Wt 87 kg   SpO2 94%   BMI 31.92 kg/m   Intake/Output Summary (Last 24 hours) at 03/28/2018 1010 Last data filed at 03/28/2018 3235 Gross per 24 hour  Intake 600 ml  Output 1675 ml  Net -1075 ml   Weight change:   Physical Exam: Gen: Comfortably sitting up on BSC CVS: Pulse regular rhythm, normal rate, ESM over apex Resp: Fine rales over both bases, no rhonchi Abd: Soft, obese, nontender Ext: 2+ lower extremity edema, 2+ right upper extremity edema, 1+ left upper extremity edema  Imaging: No results  found.  Labs: BMET Recent Labs  Lab 03/25/18 1150 03/25/18 1437 03/26/18 0356 03/27/18 0419 03/28/18 0457  NA 141  --  141 141 141  K 4.5  --  4.3 4.3 4.0  CL 110  --  112* 111 110  CO2 20*  --  18* 21* 21*  GLUCOSE 129*  --  134* 132* 108*  BUN 56*  --  61* 62* 65*  CREATININE 5.80* 5.80* 5.68* 5.78* 6.21*  CALCIUM 8.7*  --  8.6* 8.5* 8.7*  PHOS  --   --  4.5  --   --    CBC Recent Labs  Lab 03/25/18 1150 03/25/18 1437 03/26/18 0356  WBC 6.9 6.6 6.8  NEUTROABS 4.6  --   --   HGB 10.6* 10.4* 10.7*  HCT 34.7* 33.9* 32.4*  MCV 100.6* 100.3* 96.1  PLT 193 156 199    Medications:    . amLODipine  10 mg Oral Daily  . atorvastatin  10 mg Oral q1800  . brimonidine  1 drop Right Eye TID  . calcitRIOL  0.25 mcg Oral Daily  . carvedilol  3.125 mg Oral BID WC  . dorzolamide-timolol  1 drop Right Eye BID  . gabapentin  100 mg Oral QHS  . heparin  5,000 Units Subcutaneous Q8H  . hydrALAZINE  100 mg Oral Q8H  . insulin aspart  0-15 Units Subcutaneous TID WC  . insulin aspart  0-5 Units Subcutaneous QHS  . latanoprost  1 drop Right Eye QHS  . nitroGLYCERIN  0.5 inch Topical Q6H  . pantoprazole  40 mg Oral Daily  . pilocarpine  1 drop Right Eye TID  . sodium chloride flush  3 mL Intravenous Q12H   Elmarie Shiley, MD 03/28/2018, 10:10 AM

## 2018-03-28 NOTE — Care Management Important Message (Signed)
Important Message  Patient Details  Name: MAXXIMUS GOTAY MRN: [id number] Date of Birth: [birth year]   Medicare Important Message Given:  Yes    Barb Merino [place] 03/28/2018, 11:29 AM

## 2018-03-28 NOTE — Progress Notes (Signed)
Right upper and lower extremity venous duplex has been completed.   Preliminary results in CV Proc.   Abram Sander 03/28/2018 11:45 AM

## 2018-03-28 NOTE — Clinical Social Work Note (Signed)
Clinical Social Work Assessment  Patient Details  Name: Elijah White MRN: [id number] Date of Birth: [birth year]  Date of referral:  03/28/18               Reason for consult:  Facility Placement                Permission sought to share information with:  Facility Sport and exercise psychologist, Family Supports Permission granted to share information::  Yes, Verbal Permission Granted  Name::     [name]  Agency::     Relationship::  Daughter  Contact Information:  [phone]  Housing/Transportation Living arrangements for the past 2 months:  Apartment Source of Information:  Adult Children Patient Interpreter Needed:  None Criminal Activity/Legal Involvement Pertinent to Current Situation/Hospitalization:  No - Comment as needed Significant Relationships:  Adult Children Lives with:  Self Do you feel safe going back to the place where you live?  Yes Need for family participation in patient care:  No (Coment)  Care giving concerns:  CSW received consult for possible SNF placement at time of discharge. CSW spoke with patient's daughter regarding as requested. She reports that she has concerns about patient going home alone due to his vision impairment. CSW explained that patient has been recommended for home health PT and does not meet requirements for insurance to pay for SNF short term rehab. CSW offered private pay options at SNF for ltc but she stated that was not an option at this time. CSW also suggested that patient would likely be best served at an ALF level of care since he is physically mobile. She reported that she had applied for Medicaid a few weeks ago but would need to fill out another application for ALF.    Social Worker assessment / plan: CSW spoke with patient's daughter.  Employment status:  Retired Nurse, adult PT Recommendations:  Home with [place] / Referral to community resources:     Patient/Family's Response to care:   Patient's daughter expressed concern about patient returning home alone since he cannot see to take his medications. CSW inquired if he had used home health before, which he has. CSW can meet with patient's daughter again tomorrow To address further questions.   Patient/Family's Understanding of and Emotional Response to Diagnosis, Current Treatment, and Prognosis:  Patient/family is realistic regarding therapy needs and expressed being hopeful for safety of the patient.   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

## 2018-03-28 NOTE — Progress Notes (Signed)
PROGRESS NOTE                                                                                                                                                                                                             Patient Demographics:    Elijah White, is a 78 y.o. male, DOB - 09/23/1940, IWL:[id number]  Admit date - 03/25/2018   Admitting Physician Thurnell Lose, MD  Outpatient Primary MD for the patient is [name], MD  LOS - 3  Chief Complaint  Patient presents with  . Shortness of Breath  . Facial Swelling       Brief Narrative   Elijah White  is a 78 y.o. male, with history of CKD 5 progressing towards ESRD, chronic diastolic CHF last known EF around 55% on recent echocardiogram, essential hypertension, DM type II, who lives at home by himself ambulates with the help of a walker, comes into the hospital with gradually progressive 1 week history of shortness of breath and orthopnea.  He denies any other medical problems, specifically no fever chills, no productive cough, no chest pain or abdominal pain.  He has also noticed gradual swelling in his arms and legs and multiple pounds of weight gain over the last several weeks.  Came to the ER for evaluation of his shortness of breath he was diagnosed with acute on chronic diastolic CHF, ARF on CKD 5, nephrology was consulted and I was requested to admit.  Patient after wearing some oxygen and sitting up in bed is feeling a whole lot better.   Subjective:   Patient in bed, appears comfortable, denies any headache, no fever, no chest pain or pressure, mild orthopnea and shortness of breath , no abdominal pain. No focal weakness.   Assessment  & Plan :     1.  Acute on chronic diastolic CHF - EF around 55 to 60% on recent echocardiogram done late in 2019. Combination of underlying CKD 5 with minimal urine output on a daily basis,continue on Nitropaste, low-dose oral Coreg, supplemental oxygen.  Continue salt  and fluid restriction. Since his creatinine has bumped on 03/28/18 I will hold his IV Lasix today, have informed nephrologist Dr. Posey Pronto about it on 03/28/2018.  Will have nephrology adjust further diuretics.  2.  Mild borderline elevated troponin.  Due to #1 above from demand ischemia, no chest pain on acute EKG.  No further work-up needed.  3.  ARF on CKD 5.  Gradually progressive, discussed with nephrologist Dr.  Patel continue diuresis and monitor.  Has left arm AV fistula placed recently likely is not mature yet.  He may progress to HD.  4.  Dyslipidemia.  Continue home dose statin.  5.  Essential hypertension.  Was in poor control has been placed on nitro place, Lasix, higher than home dose hydralazine will add low-dose Coreg and monitor.  6.  Anemia of chronic disease.  Stable anemia panel continue to monitor no need for transfusion.  7. DM type II.  Will hold oral hypoglycemics due to worsening renal function, check A1c, sliding scale and monitor.  Lab Results  Component Value Date   HGBA1C 6.3 (H) 03/25/2018   CBG (last 3)  Recent Labs    03/27/18 1241 03/27/18 1746 03/27/18 2150  GLUCAP 121* 140* 114*       Family Communication  : Daughter at the time of admission and again on 03/27/2018  Code Status :  Full  Disposition Plan  : SNF once medically stable  Consults  :  Renal  Procedures  :    TTE - 09/2017 - Left ventricle: The cavity size was normal. Wall thickness was increased in a pattern of mild LVH. Systolic function was  vigorous. The estimated ejection fraction was in the range of 65% to 70%. Wall motion was normal; there were no regional wall motion abnormalities. Doppler parameters are consistent with  abnormal left ventricular relaxation (grade 1 diastolicdysfunction).  DVT Prophylaxis  :   Heparin   Lab Results  Component Value Date   PLT 199 03/26/2018    Diet :  Diet Order            Diet renal with fluid restriction Fluid restriction: 1200 mL  Fluid; Room service appropriate? Yes; Fluid consistency: Thin  Diet effective now               Inpatient Medications  Scheduled Meds:  . amLODipine  10 mg Oral Daily  . atorvastatin  10 mg Oral q1800  . brimonidine  1 drop Right Eye TID  . calcitRIOL  0.25 mcg Oral Daily  . carvedilol  3.125 mg Oral BID WC  . dorzolamide-timolol  1 drop Right Eye BID  . gabapentin  100 mg Oral QHS  . heparin  5,000 Units Subcutaneous Q8H  . hydrALAZINE  100 mg Oral Q8H  . insulin aspart  0-15 Units Subcutaneous TID WC  . insulin aspart  0-5 Units Subcutaneous QHS  . latanoprost  1 drop Right Eye QHS  . nitroGLYCERIN  0.5 inch Topical Q6H  . pantoprazole  40 mg Oral Daily  . pilocarpine  1 drop Right Eye TID  . sodium chloride flush  3 mL Intravenous Q12H   Continuous Infusions: . ferric gluconate (FERRLECIT/NULECIT) IV 125 mg (03/28/18 0916)   PRN Meds:.acetaminophen **OR** [DISCONTINUED] acetaminophen, hydrALAZINE, HYDROcodone-acetaminophen, polyethylene glycol  Antibiotics  :   Anti-infectives (From admission, onward)   None        Objective:   Vitals:   03/27/18 1815 03/27/18 2154 03/28/18 0510 03/28/18 0638  BP:  134/63 137/61   Pulse:  91 97   Resp:  18 20   Temp:  98.4 F (36.9 C) 98.4 F (36.9 C)   TempSrc:  Oral Oral   SpO2:  92% 94%   Weight: 88.5 kg   87 kg  Height:        Wt Readings from Last 3 Encounters:  03/28/18 87 kg  03/20/18 90.7 kg  03/03/18 90.7 kg  Intake/Output Summary (Last 24 hours) at 03/28/2018 0916 Last data filed at 03/28/2018 0249 Gross per 24 hour  Intake 250 ml  Output 1975 ml  Net -1725 ml     Physical Exam  Awake Alert, Oriented X 3, No new F.N deficits, Normal affect [place].AT,PERRAL Supple Neck,No JVD, No cervical lymphadenopathy appriciated.  Symmetrical Chest wall movement, Good air movement bilaterally, CTAB RRR,No Gallops, Rubs or new Murmurs, No Parasternal Heave +ve B.Sounds, Abd Soft, No tenderness, No organomegaly  appriciated, No rebound - guarding or rigidity. No Cyanosis, Clubbing, 1+ edema, No new Rash or bruise    Data Review:    CBC Recent Labs  Lab 03/25/18 1150 03/25/18 1437 03/26/18 0356  WBC 6.9 6.6 6.8  HGB 10.6* 10.4* 10.7*  HCT 34.7* 33.9* 32.4*  PLT 193 156 199  MCV 100.6* 100.3* 96.1  MCH 30.7 30.8 31.8  MCHC 30.5 30.7 33.0  RDW 14.6 14.5 14.1  LYMPHSABS 1.2  --   --   MONOABS 1.0  --   --   EOSABS 0.1  --   --   BASOSABS 0.0  --   --     Chemistries  Recent Labs  Lab 03/25/18 1150 03/25/18 1437 03/26/18 0356 03/27/18 0419 03/28/18 0457  NA 141  --  141 141 141  K 4.5  --  4.3 4.3 4.0  CL 110  --  112* 111 110  CO2 20*  --  18* 21* 21*  GLUCOSE 129*  --  134* 132* 108*  BUN 56*  --  61* 62* 65*  CREATININE 5.80* 5.80* 5.68* 5.78* 6.21*  CALCIUM 8.7*  --  8.6* 8.5* 8.7*  MG  --   --   --  1.7  --    ------------------------------------------------------------------------------------------------------------------ No results for input(s): CHOL, HDL, LDLCALC, TRIG, CHOLHDL, LDLDIRECT in the last 72 hours.  Lab Results  Component Value Date   HGBA1C 6.3 (H) 03/25/2018   ------------------------------------------------------------------------------------------------------------------ Recent Labs    03/25/18 1437  TSH 1.169   ------------------------------------------------------------------------------------------------------------------ Recent Labs    03/25/18 1437  VITAMINB12 565  FOLATE 8.3  FERRITIN 269  TIBC 248*  IRON 33*  RETICCTPCT 1.3    Coagulation profile Recent Labs  Lab 03/25/18 1437  INR 1.11    No results for input(s): DDIMER in the last 72 hours.  Cardiac Enzymes Recent Labs  Lab 03/25/18 1150  TROPONINI 0.04*   ------------------------------------------------------------------------------------------------------------------    Component Value Date/Time   BNP 871.0 (H) 03/25/2018 1150    Micro Results No results  found for this or any previous visit (from the past 240 hour(s)).  Radiology Reports Dg Chest Portable 1 View  Result Date: 03/25/2018 CLINICAL DATA:  Acute shortness of breath.  Patient on dialysis. EXAM: PORTABLE CHEST 1 VIEW COMPARISON:  09/01/2017 and prior radiographs FINDINGS: Mild cardiomegaly with pulmonary vascular congestion noted. Bilateral perihilar airspace opacities noted, LEFT-greater-than-RIGHT. There may be trace bilateral pleural effusions present. There is no evidence of pneumothorax or acute bony abnormality. IMPRESSION: Bilateral perihilar airspace opacities, LEFT-greater-than-RIGHT, slightly favor asymmetric pulmonary edema over infection but correlate clinically. Mild cardiomegaly with pulmonary vascular congestion and trace bilateral pleural effusions. Electronically Signed   By: Margarette Canada M.D.   On: 03/25/2018 11:51   Vas [place] Upper Extremity Arterial Duplex  Result Date: 03/03/2018 UPPER EXTREMITY DUPLEX STUDY Indications: Evaluation prior to dialysis access.  Risk Factors: Hypertension, hyperlipidemia, Diabetes, current smoker. Performing Technologist: Delorise Shiner RVT  Examination Guidelines: A complete evaluation includes B-mode imaging, spectral Doppler,  color Doppler, and power Doppler as needed of all accessible portions of each vessel. Bilateral testing is considered an integral part of a complete examination. Limited examinations for reoccurring indications may be performed as noted.  Right Pre-Dialysis Findings: +-----------------------+----------+--------------------+---------+--------+ Location               PSV (cm/s)Intralum. Diam. (cm)Waveform Comments +-----------------------+----------+--------------------+---------+--------+ Brachial Antecub. fossa100       0.71                triphasic         +-----------------------+----------+--------------------+---------+--------+ Radial Art at Wrist    105       0.39                triphasic          +-----------------------+----------+--------------------+---------+--------+ Ulnar Art at Wrist     90        0.26                triphasic         +-----------------------+----------+--------------------+---------+--------+ Left Pre-Dialysis Findings: +-----------------------+----------+--------------------+---------+--------+ Location               PSV (cm/s)Intralum. Diam. (cm)Waveform Comments +-----------------------+----------+--------------------+---------+--------+ Brachial Antecub. fossa89        0.70                triphasic         +-----------------------+----------+--------------------+---------+--------+ Radial Art at Wrist    116       0.38                triphasic         +-----------------------+----------+--------------------+---------+--------+ Ulnar Art at Wrist     94        0.23                triphasic         +-----------------------+----------+--------------------+---------+--------+  Summary:  Right: No obstruction visualized in the right upper extremity. Left: No obstruction visualized in the left upper extremity. *See table(s) above for measurements and observations. Electronically signed by Servando Snare MD on 03/03/2018 at 9:29:22 AM.    Final    Vas [place] Upper Ext Vein Mapping (pre-op Avf)  Result Date: 03/03/2018 UPPER EXTREMITY VEIN MAPPING  Indications: Pre-access. Performing Technologist: Delorise Shiner RVT  Examination Guidelines: A complete evaluation includes B-mode imaging, spectral Doppler, color Doppler, and power Doppler as needed of all accessible portions of each vessel. Bilateral testing is considered an integral part of a complete examination. Limited examinations for reoccurring indications may be performed as noted. +-----------------+-------------+----------+--------+ Right Cephalic   Diameter (cm)Depth (cm)Findings +-----------------+-------------+----------+--------+ Shoulder             0.20                         +-----------------+-------------+----------+--------+ Prox upper arm       0.28                        +-----------------+-------------+----------+--------+ Mid upper arm        0.22                        +-----------------+-------------+----------+--------+ Dist upper arm       0.23                        +-----------------+-------------+----------+--------+ Antecubital fossa    0.31                        +-----------------+-------------+----------+--------+  Prox forearm         0.24                        +-----------------+-------------+----------+--------+ Mid forearm          0.23                        +-----------------+-------------+----------+--------+ Dist forearm         0.22                        +-----------------+-------------+----------+--------+ +-----------------+-------------+----------+-------------------------------+ Right Basilic    Diameter (cm)Depth (cm)           Findings             +-----------------+-------------+----------+-------------------------------+ Prox upper arm       0.50                                               +-----------------+-------------+----------+-------------------------------+ Mid upper arm        0.32                                               +-----------------+-------------+----------+-------------------------------+ Dist upper arm       0.34                                               +-----------------+-------------+----------+-------------------------------+ Antecubital fossa    0.24                                               +-----------------+-------------+----------+-------------------------------+ Prox forearm                            subdivides to multiple branches +-----------------+-------------+----------+-------------------------------+ +-----------------+-------------+----------+--------+ Left Cephalic    Diameter (cm)Depth (cm)Findings  +-----------------+-------------+----------+--------+ Shoulder             0.22                        +-----------------+-------------+----------+--------+ Prox upper arm       0.24                        +-----------------+-------------+----------+--------+ Mid upper arm        0.30                        +-----------------+-------------+----------+--------+ Dist upper arm       0.31                        +-----------------+-------------+----------+--------+ Antecubital fossa    0.32                        +-----------------+-------------+----------+--------+ Prox forearm         0.23                        +-----------------+-------------+----------+--------+  Mid forearm          0.21                        +-----------------+-------------+----------+--------+ Dist forearm         0.26                        +-----------------+-------------+----------+--------+ +-----------------+-------------+----------+--------+ Left Basilic     Diameter (cm)Depth (cm)Findings +-----------------+-------------+----------+--------+ Mid upper arm        0.38                        +-----------------+-------------+----------+--------+ Dist upper arm       0.30                        +-----------------+-------------+----------+--------+ Antecubital fossa    0.28                        +-----------------+-------------+----------+--------+ Prox forearm         0.29                        +-----------------+-------------+----------+--------+ Summary: Right: Basilic and cephalic veins are identified with diameters as        listed above. Left: Basilic and cephalic veins are identified with diameters as       listed above. *See table(s) above for measurements and observations.  Diagnosing physician: Servando Snare MD Electronically signed by Servando Snare MD on 03/03/2018 at 9:28:38 AM.    Final     Time Spent in minutes  30   Lala Lund M.D on 03/28/2018 at 9:16  AM  To page go to [email or web address] - password [email or web address]

## 2018-03-29 LAB — BASIC METABOLIC PANEL
Anion gap: 14 (ref 5–15)
BUN: 72 mg/dL — ABNORMAL HIGH (ref 8–23)
CO2: 22 mmol/L (ref 22–32)
CREATININE: 6.48 mg/dL — AB (ref 0.61–1.24)
Calcium: 8.7 mg/dL — ABNORMAL LOW (ref 8.9–10.3)
Chloride: 105 mmol/L (ref 98–111)
GFR calc Af Amer: 9 mL/min — ABNORMAL LOW (ref >60)
GFR calc non Af Amer: 7 mL/min — ABNORMAL LOW (ref >60)
Glucose, Bld: 123 mg/dL — ABNORMAL HIGH (ref 70–99)
Potassium: 4.1 mmol/L (ref 3.5–5.1)
SODIUM: 141 mmol/L (ref 135–145)

## 2018-03-29 LAB — GLUCOSE, CAPILLARY
Glucose-Capillary: 100 mg/dL — ABNORMAL HIGH (ref 70–99)
Glucose-Capillary: 131 mg/dL — ABNORMAL HIGH (ref 70–99)
Glucose-Capillary: 137 mg/dL — ABNORMAL HIGH (ref 70–99)
Glucose-Capillary: 114 mg/dL — ABNORMAL HIGH (ref 70–99)

## 2018-03-29 NOTE — Progress Notes (Signed)
Patient ID: Elijah White, male   DOB: 02-Feb-1941, 78 y.o.   MRN: [id number] [facility] Progress Note   Assessment/ Plan:   1.  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  Continues to have decent urine output with net negative fluid balance and consistent weight loss since admission translating to improvement with his edema.  Does not have any acute indications for dialysis at this time (if required, this would be through a [facility] as his left BBF is immature). 2.  Acute exacerbation of diastolic heart failure: Transitioned yesterday p.m. to oral diuretics today and will continue to monitor input/output and daily weights-anticipate ability to discharge him home tomorrow. 3.  Anemia: This appears to be anemia of chronic kidney disease and possibly exacerbated by recent surgery.    Getting intravenous iron 4.  Hypertension: Blood pressures elevated, back on antihypertensive therapy and will continue to monitor with diuresis. 5.  Secondary hyperparathyroidism: Calcium and phosphorus levels currently within acceptable range  Subjective:   Reports improvement with regards to shortness of breath and ability to ambulate.  Right arm swelling is lower (no evidence of DVT/subclavian thrombus).   Objective:   BP (!) 140/58   Pulse 91   Temp 98.8 F (37.1 C) (Oral)   Resp 14   Ht 5\' 5"  (1.651 m)   Wt 87 kg   SpO2 95%   BMI 31.92 kg/m   Intake/Output Summary (Last 24 hours) at 03/29/2018 8119 Last data filed at 03/29/2018 0900 Gross per 24 hour  Intake 720 ml  Output 1650 ml  Net -930 ml   Weight change:   Physical Exam: Gen: Comfortably sitting up in bed CVS: Pulse regular rhythm, normal rate, ESM over apex Resp: Left base rales, right side diminished breath sounds, no rhonchi Abd: Soft, obese, nontender Ext: 2+ lower extremity edema, 2+ right upper extremity edema, 1+ left upper extremity  edema  Imaging: Vas [place] Lower Extremity Venous (dvt)  Result Date: 03/28/2018  Lower Venous Study Indications: Swelling.  Performing Technologist: Abram Sander RVS  Examination Guidelines: A complete evaluation includes B-mode imaging, spectral Doppler, color Doppler, and power Doppler as needed of all accessible portions of each vessel. Bilateral testing is considered an integral part of a complete examination. Limited examinations for reoccurring indications may be performed as noted.  Right Venous Findings: +---------+---------------+---------+-----------+----------+-------+          CompressibilityPhasicitySpontaneityPropertiesSummary +---------+---------------+---------+-----------+----------+-------+ CFV      Full           Yes      Yes                          +---------+---------------+---------+-----------+----------+-------+ SFJ      Full                                                 +---------+---------------+---------+-----------+----------+-------+ FV Prox  Full                                                 +---------+---------------+---------+-----------+----------+-------+ FV Mid   Full                                                 +---------+---------------+---------+-----------+----------+-------+  FV DistalFull                                                 +---------+---------------+---------+-----------+----------+-------+ PFV      Full                                                 +---------+---------------+---------+-----------+----------+-------+ POP      Full           Yes      Yes                          +---------+---------------+---------+-----------+----------+-------+ PTV      Full                                                 +---------+---------------+---------+-----------+----------+-------+ PERO     Full                                                  +---------+---------------+---------+-----------+----------+-------+  Left Venous Findings: +---+---------------+---------+-----------+----------+-------+    CompressibilityPhasicitySpontaneityPropertiesSummary +---+---------------+---------+-----------+----------+-------+ CFVFull           Yes      Yes                          +---+---------------+---------+-----------+----------+-------+    Summary: Right: There is no evidence of deep vein thrombosis in the lower extremity. No cystic structure found in the popliteal fossa. Left: No evidence of common femoral vein obstruction.  *See table(s) above for measurements and observations. Electronically signed by Servando Snare MD on 03/28/2018 at 12:03:04 PM.    Final    Vas [place] Upper Extremity Venous Duplex  Result Date: 03/28/2018 UPPER VENOUS STUDY  Indications: Swelling Performing Technologist: Abram Sander RVS  Examination Guidelines: A complete evaluation includes B-mode imaging, spectral Doppler, color Doppler, and power Doppler as needed of all accessible portions of each vessel. Bilateral testing is considered an integral part of a complete examination. Limited examinations for reoccurring indications may be performed as noted.  Right Findings: +----------+------------+----------+---------+-----------+-------+ RIGHT     CompressiblePropertiesPhasicitySpontaneousSummary +----------+------------+----------+---------+-----------+-------+ IJV           Full                 Yes       Yes            +----------+------------+----------+---------+-----------+-------+ Subclavian    Full                 Yes       Yes            +----------+------------+----------+---------+-----------+-------+ Axillary      Full                 Yes       Yes            +----------+------------+----------+---------+-----------+-------+ Brachial      Full  Yes       Yes             +----------+------------+----------+---------+-----------+-------+ Radial        Full                                          +----------+------------+----------+---------+-----------+-------+ Ulnar         Full                                          +----------+------------+----------+---------+-----------+-------+ Cephalic      None                                   Acute  +----------+------------+----------+---------+-----------+-------+  Left Findings: +----------+------------+----------+---------+-----------+-------+ LEFT      CompressiblePropertiesPhasicitySpontaneousSummary +----------+------------+----------+---------+-----------+-------+ Subclavian    Full                 Yes       Yes            +----------+------------+----------+---------+-----------+-------+  Summary:  Right: No evidence of deep vein thrombosis in the upper extremity. Findings consistent with acute superficial vein thrombosis involving the right cephalic vein.  Left: No evidence of thrombosis in the subclavian.  *See table(s) above for measurements and observations.  Diagnosing physician: Servando Snare MD Electronically signed by Servando Snare MD on 03/28/2018 at 12:04:08 PM.    Final     Labs: BMET Recent Labs  Lab 03/25/18 1150 03/25/18 1437 03/26/18 0356 03/27/18 0419 03/28/18 0457 03/29/18 0338  NA 141  --  141 141 141 141  K 4.5  --  4.3 4.3 4.0 4.1  CL 110  --  112* 111 110 105  CO2 20*  --  18* 21* 21* 22  GLUCOSE 129*  --  134* 132* 108* 123*  BUN 56*  --  61* 62* 65* 72*  CREATININE 5.80* 5.80* 5.68* 5.78* 6.21* 6.48*  CALCIUM 8.7*  --  8.6* 8.5* 8.7* 8.7*  PHOS  --   --  4.5  --   --   --    CBC Recent Labs  Lab 03/25/18 1150 03/25/18 1437 03/26/18 0356  WBC 6.9 6.6 6.8  NEUTROABS 4.6  --   --   HGB 10.6* 10.4* 10.7*  HCT 34.7* 33.9* 32.4*  MCV 100.6* 100.3* 96.1  PLT 193 156 199    Medications:    . amLODipine  10 mg Oral Daily  . atorvastatin  10 mg  Oral q1800  . brimonidine  1 drop Right Eye TID  . calcitRIOL  0.25 mcg Oral Daily  . carvedilol  3.125 mg Oral BID WC  . dorzolamide-timolol  1 drop Right Eye BID  . furosemide  80 mg Oral BID  . gabapentin  100 mg Oral QHS  . heparin  5,000 Units Subcutaneous Q8H  . hydrALAZINE  100 mg Oral Q8H  . insulin aspart  0-15 Units Subcutaneous TID WC  . insulin aspart  0-5 Units Subcutaneous QHS  . latanoprost  1 drop Right Eye QHS  . nitroGLYCERIN  0.5 inch Topical Q6H  . pantoprazole  40 mg Oral Daily  . pilocarpine  1 drop Right Eye TID  . sodium chloride flush  3  mL Intravenous Q12H   Elmarie Shiley, MD 03/29/2018, 9:43 AM

## 2018-03-29 NOTE — Progress Notes (Signed)
CSW met with patient's daughter again at bedside. Patient states that he is feeling better but is still short of breath. Daughter reported that she spoke with the MD and understands that patient does not qualify for SNF. The patient is wanting to return to his home with familiar surroundings. Daughter is requesting home health PT, RN, and aide. They have used [facility] in the past and do not require any equipment. Patient has a walker at home. CSW will make RNCM aware.   CSW signing off as no further needs present.   Percell Locus Deby Adger LCSW [phone]

## 2018-03-29 NOTE — Progress Notes (Signed)
PROGRESS NOTE                                                                                                                                                                                                             Patient Demographics:    Elijah White, is a 78 y.o. male, DOB - 07/25/1940, WER:[id number]  Admit date - 03/25/2018   Admitting Physician Thurnell Lose, MD  Outpatient Primary MD for the patient is Elijah Chard, MD  LOS - 4  Chief Complaint  Patient presents with  . Shortness of Breath  . Facial Swelling       Brief Narrative   Elijah White  is a 78 y.o. male, with history of CKD 5 progressing towards ESRD, chronic diastolic CHF last known EF around 55% on recent echocardiogram, essential hypertension, DM type II, who lives at home by himself ambulates with the help of a walker, comes into the hospital with gradually progressive 1 week history of shortness of breath and orthopnea.  He denies any other medical problems, specifically no fever chills, no productive cough, no chest pain or abdominal pain.  He has also noticed gradual swelling in his arms and legs and multiple pounds of weight gain over the last several weeks.  Came to the ER for evaluation of his shortness of breath he was diagnosed with acute on chronic diastolic CHF, ARF on CKD 5, nephrology was consulted and I was requested to admit.  Patient after wearing some oxygen and sitting up in bed is feeling a whole lot better.   Subjective:   Patient in bed, appears comfortable, denies any headache, no fever, no chest pain or pressure, no shortness of breath , no abdominal pain. No focal weakness.    Assessment  & Plan :     1.  Acute on chronic diastolic CHF - EF around 55 to 60% on recent echocardiogram done late in 2019. Combination of underlying CKD 5 with minimal urine output on a daily basis, he is being treated with Nitropaste, now transition to oral Lasix along with Coreg and  supplemental oxygen.  Excellent urine output, edema has almost completely resolved, few crackles, continue diuresis 1 more day.  If stable likely discharge tomorrow with home health PT.  Did well with PT.  Case discussed with nephrologist Dr. Posey Pronto on 03/30/2019 daughter updated bedside on 03/29/2018.  2.  Mild borderline elevated troponin.  Due to #1 above from demand ischemia, no chest pain on acute EKG.  No further work-up needed.  3.  ARF on CKD 5.  Gradually progressive, discussed with nephrologist Dr. Posey Pronto continue diuresis and monitor.  Has left arm AV fistula placed recently likely is not mature yet.  He may progress to HD.  4.  Dyslipidemia.  Continue home dose statin.  5.  Essential hypertension.  Was in poor control has been placed on nitro place, Lasix, higher than home dose hydralazine will add low-dose Coreg and monitor.  6.  Anemia of chronic disease.  Stable anemia panel continue to monitor no need for transfusion.  7. DM type II.  Will hold oral hypoglycemics due to worsening renal function, check A1c, sliding scale and monitor.  Lab Results  Component Value Date   HGBA1C 6.3 (H) 03/25/2018   CBG (last 3)  Recent Labs    03/28/18 1728 03/28/18 2108 03/29/18 0758  GLUCAP 143* 175* 100*       Family Communication  : Daughter at the time of admission and again on 03/27/2018 and 03/29/2018  Code Status :  Full  Disposition Plan  : SNF once medically stable  Consults  :  Renal  Procedures  :    TTE - 09/2017 - Left ventricle: The cavity size was normal. Wall thickness was increased in a pattern of mild LVH. Systolic function was  vigorous. The estimated ejection fraction was in the range of 65% to 70%. Wall motion was normal; there were no regional wall motion abnormalities. Doppler parameters are consistent with  abnormal left ventricular relaxation (grade 1 diastolicdysfunction).  DVT Prophylaxis  :   Heparin   Lab Results  Component Value Date   PLT 199  03/26/2018    Diet :  Diet Order            Diet renal with fluid restriction Fluid restriction: 1200 mL Fluid; Room service appropriate? Yes; Fluid consistency: Thin  Diet effective now               Inpatient Medications  Scheduled Meds:  . amLODipine  10 mg Oral Daily  . atorvastatin  10 mg Oral q1800  . brimonidine  1 drop Right Eye TID  . calcitRIOL  0.25 mcg Oral Daily  . carvedilol  3.125 mg Oral BID WC  . dorzolamide-timolol  1 drop Right Eye BID  . furosemide  80 mg Oral BID  . gabapentin  100 mg Oral QHS  . heparin  5,000 Units Subcutaneous Q8H  . hydrALAZINE  100 mg Oral Q8H  . insulin aspart  0-15 Units Subcutaneous TID WC  . insulin aspart  0-5 Units Subcutaneous QHS  . latanoprost  1 drop Right Eye QHS  . nitroGLYCERIN  0.5 inch Topical Q6H  . pantoprazole  40 mg Oral Daily  . pilocarpine  1 drop Right Eye TID  . sodium chloride flush  3 mL Intravenous Q12H   Continuous Infusions: . ferric gluconate (FERRLECIT/NULECIT) IV 125 mg (03/28/18 0916)   PRN Meds:.acetaminophen **OR** [DISCONTINUED] acetaminophen, hydrALAZINE, HYDROcodone-acetaminophen, polyethylene glycol  Antibiotics  :   Anti-infectives (From admission, onward)   None        Objective:   Vitals:   03/28/18 1538 03/28/18 2029 03/29/18 0515 03/29/18 0934  BP: 135/66 (!) 143/66 125/61 (!) 140/58  Pulse: 83 84 85 91  Resp: (!) 23 16 14    Temp: 98.5 F (36.9 C) 97.8 F (36.6 C) 98.8 F (37.1 C)   TempSrc: Oral Oral Oral   SpO2: 97% 96% 95%   Weight:  Height:        Wt Readings from Last 3 Encounters:  03/28/18 87 kg  03/20/18 90.7 kg  03/03/18 90.7 kg     Intake/Output Summary (Last 24 hours) at 03/29/2018 1124 Last data filed at 03/29/2018 0949 Gross per 24 hour  Intake 723 ml  Output 1650 ml  Net -927 ml     Physical Exam  Awake Alert, Oriented X 3, No new F.N deficits, Normal affect [place].AT,PERRAL Supple Neck,No JVD, No cervical lymphadenopathy appriciated.    Symmetrical Chest wall movement, Good air movement bilaterally, few rales RRR,No Gallops, Rubs or new Murmurs, No Parasternal Heave +ve B.Sounds, Abd Soft, No tenderness, No organomegaly appriciated, No rebound - guarding or rigidity. No Cyanosis, Clubbing or edema, No new Rash or bruise     Data Review:    CBC Recent Labs  Lab 03/25/18 1150 03/25/18 1437 03/26/18 0356  WBC 6.9 6.6 6.8  HGB 10.6* 10.4* 10.7*  HCT 34.7* 33.9* 32.4*  PLT 193 156 199  MCV 100.6* 100.3* 96.1  MCH 30.7 30.8 31.8  MCHC 30.5 30.7 33.0  RDW 14.6 14.5 14.1  LYMPHSABS 1.2  --   --   MONOABS 1.0  --   --   EOSABS 0.1  --   --   BASOSABS 0.0  --   --     Chemistries  Recent Labs  Lab 03/25/18 1150 03/25/18 1437 03/26/18 0356 03/27/18 0419 03/28/18 0457 03/29/18 0338  NA 141  --  141 141 141 141  K 4.5  --  4.3 4.3 4.0 4.1  CL 110  --  112* 111 110 105  CO2 20*  --  18* 21* 21* 22  GLUCOSE 129*  --  134* 132* 108* 123*  BUN 56*  --  61* 62* 65* 72*  CREATININE 5.80* 5.80* 5.68* 5.78* 6.21* 6.48*  CALCIUM 8.7*  --  8.6* 8.5* 8.7* 8.7*  MG  --   --   --  1.7  --   --    ------------------------------------------------------------------------------------------------------------------ No results for input(s): CHOL, HDL, LDLCALC, TRIG, CHOLHDL, LDLDIRECT in the last 72 hours.  Lab Results  Component Value Date   HGBA1C 6.3 (H) 03/25/2018   ------------------------------------------------------------------------------------------------------------------ No results for input(s): TSH, T4TOTAL, T3FREE, THYROIDAB in the last 72 hours.  Invalid input(s): FREET3 ------------------------------------------------------------------------------------------------------------------ No results for input(s): VITAMINB12, FOLATE, FERRITIN, TIBC, IRON, RETICCTPCT in the last 72 hours.  Coagulation profile Recent Labs  Lab 03/25/18 1437  INR 1.11    No results for input(s): DDIMER in the last 72  hours.  Cardiac Enzymes Recent Labs  Lab 03/25/18 1150  TROPONINI 0.04*   ------------------------------------------------------------------------------------------------------------------    Component Value Date/Time   BNP 871.0 (H) 03/25/2018 1150    Micro Results No results found for this or any previous visit (from the past 240 hour(s)).  Radiology Reports Dg Chest Portable 1 View  Result Date: 03/25/2018 CLINICAL DATA:  Acute shortness of breath.  Patient on dialysis. EXAM: PORTABLE CHEST 1 VIEW COMPARISON:  09/01/2017 and prior radiographs FINDINGS: Mild cardiomegaly with pulmonary vascular congestion noted. Bilateral perihilar airspace opacities noted, LEFT-greater-than-RIGHT. There may be trace bilateral pleural effusions present. There is no evidence of pneumothorax or acute bony abnormality. IMPRESSION: Bilateral perihilar airspace opacities, LEFT-greater-than-RIGHT, slightly favor asymmetric pulmonary edema over infection but correlate clinically. Mild cardiomegaly with pulmonary vascular congestion and trace bilateral pleural effusions. Electronically Signed   By: Margarette Canada M.D.   On: 03/25/2018 11:51   Vas [place] Upper Extremity Arterial  Duplex  Result Date: 03/03/2018 UPPER EXTREMITY DUPLEX STUDY Indications: Evaluation prior to dialysis access.  Risk Factors: Hypertension, hyperlipidemia, Diabetes, current smoker. Performing Technologist: Delorise Shiner RVT  Examination Guidelines: A complete evaluation includes B-mode imaging, spectral Doppler, color Doppler, and power Doppler as needed of all accessible portions of each vessel. Bilateral testing is considered an integral part of a complete examination. Limited examinations for reoccurring indications may be performed as noted.  Right Pre-Dialysis Findings: +-----------------------+----------+--------------------+---------+--------+ Location               PSV (cm/s)Intralum. Diam. (cm)Waveform Comments  +-----------------------+----------+--------------------+---------+--------+ Brachial Antecub. fossa100       0.71                triphasic         +-----------------------+----------+--------------------+---------+--------+ Radial Art at Wrist    105       0.39                triphasic         +-----------------------+----------+--------------------+---------+--------+ Ulnar Art at Wrist     90        0.26                triphasic         +-----------------------+----------+--------------------+---------+--------+ Left Pre-Dialysis Findings: +-----------------------+----------+--------------------+---------+--------+ Location               PSV (cm/s)Intralum. Diam. (cm)Waveform Comments +-----------------------+----------+--------------------+---------+--------+ Brachial Antecub. fossa89        0.70                triphasic         +-----------------------+----------+--------------------+---------+--------+ Radial Art at Wrist    116       0.38                triphasic         +-----------------------+----------+--------------------+---------+--------+ Ulnar Art at Wrist     94        0.23                triphasic         +-----------------------+----------+--------------------+---------+--------+  Summary:  Right: No obstruction visualized in the right upper extremity. Left: No obstruction visualized in the left upper extremity. *See table(s) above for measurements and observations. Electronically signed by Servando Snare MD on 03/03/2018 at 9:29:22 AM.    Final    Vas [place] Lower Extremity Venous (dvt)  Result Date: 03/28/2018  Lower Venous Study Indications: Swelling.  Performing Technologist: Abram Sander RVS  Examination Guidelines: A complete evaluation includes B-mode imaging, spectral Doppler, color Doppler, and power Doppler as needed of all accessible portions of each vessel. Bilateral testing is considered an integral part of a complete examination. Limited  examinations for reoccurring indications may be performed as noted.  Right Venous Findings: +---------+---------------+---------+-----------+----------+-------+          CompressibilityPhasicitySpontaneityPropertiesSummary +---------+---------------+---------+-----------+----------+-------+ CFV      Full           Yes      Yes                          +---------+---------------+---------+-----------+----------+-------+ SFJ      Full                                                 +---------+---------------+---------+-----------+----------+-------+ FV Prox  Full                                                 +---------+---------------+---------+-----------+----------+-------+ FV Mid   Full                                                 +---------+---------------+---------+-----------+----------+-------+ FV DistalFull                                                 +---------+---------------+---------+-----------+----------+-------+ PFV      Full                                                 +---------+---------------+---------+-----------+----------+-------+ POP      Full           Yes      Yes                          +---------+---------------+---------+-----------+----------+-------+ PTV      Full                                                 +---------+---------------+---------+-----------+----------+-------+ PERO     Full                                                 +---------+---------------+---------+-----------+----------+-------+  Left Venous Findings: +---+---------------+---------+-----------+----------+-------+    CompressibilityPhasicitySpontaneityPropertiesSummary +---+---------------+---------+-----------+----------+-------+ CFVFull           Yes      Yes                          +---+---------------+---------+-----------+----------+-------+    Summary: Right: There is no evidence of deep vein thrombosis in the  lower extremity. No cystic structure found in the popliteal fossa. Left: No evidence of common femoral vein obstruction.  *See table(s) above for measurements and observations. Electronically signed by Servando Snare MD on 03/28/2018 at 12:03:04 PM.    Final    Vas [place] Upper Ext Vein Mapping (pre-op Avf)  Result Date: 03/03/2018 UPPER EXTREMITY VEIN MAPPING  Indications: Pre-access. Performing Technologist: Delorise Shiner RVT  Examination Guidelines: A complete evaluation includes B-mode imaging, spectral Doppler, color Doppler, and power Doppler as needed of all accessible portions of each vessel. Bilateral testing is considered an integral part of a complete examination. Limited examinations for reoccurring indications may be performed as noted. +-----------------+-------------+----------+--------+ Right Cephalic   Diameter (cm)Depth (cm)Findings +-----------------+-------------+----------+--------+ Shoulder             0.20                        +-----------------+-------------+----------+--------+  Prox upper arm       0.28                        +-----------------+-------------+----------+--------+ Mid upper arm        0.22                        +-----------------+-------------+----------+--------+ Dist upper arm       0.23                        +-----------------+-------------+----------+--------+ Antecubital fossa    0.31                        +-----------------+-------------+----------+--------+ Prox forearm         0.24                        +-----------------+-------------+----------+--------+ Mid forearm          0.23                        +-----------------+-------------+----------+--------+ Dist forearm         0.22                        +-----------------+-------------+----------+--------+ +-----------------+-------------+----------+-------------------------------+ Right Basilic    Diameter (cm)Depth (cm)           Findings              +-----------------+-------------+----------+-------------------------------+ Prox upper arm       0.50                                               +-----------------+-------------+----------+-------------------------------+ Mid upper arm        0.32                                               +-----------------+-------------+----------+-------------------------------+ Dist upper arm       0.34                                               +-----------------+-------------+----------+-------------------------------+ Antecubital fossa    0.24                                               +-----------------+-------------+----------+-------------------------------+ Prox forearm                            subdivides to multiple branches +-----------------+-------------+----------+-------------------------------+ +-----------------+-------------+----------+--------+ Left Cephalic    Diameter (cm)Depth (cm)Findings +-----------------+-------------+----------+--------+ Shoulder             0.22                        +-----------------+-------------+----------+--------+ Prox upper arm       0.24                        +-----------------+-------------+----------+--------+  Mid upper arm        0.30                        +-----------------+-------------+----------+--------+ Dist upper arm       0.31                        +-----------------+-------------+----------+--------+ Antecubital fossa    0.32                        +-----------------+-------------+----------+--------+ Prox forearm         0.23                        +-----------------+-------------+----------+--------+ Mid forearm          0.21                        +-----------------+-------------+----------+--------+ Dist forearm         0.26                        +-----------------+-------------+----------+--------+ +-----------------+-------------+----------+--------+ Left Basilic      Diameter (cm)Depth (cm)Findings +-----------------+-------------+----------+--------+ Mid upper arm        0.38                        +-----------------+-------------+----------+--------+ Dist upper arm       0.30                        +-----------------+-------------+----------+--------+ Antecubital fossa    0.28                        +-----------------+-------------+----------+--------+ Prox forearm         0.29                        +-----------------+-------------+----------+--------+ Summary: Right: Basilic and cephalic veins are identified with diameters as        listed above. Left: Basilic and cephalic veins are identified with diameters as       listed above. *See table(s) above for measurements and observations.  Diagnosing physician: Servando Snare MD Electronically signed by Servando Snare MD on 03/03/2018 at 9:28:38 AM.    Final    Vas [place] Upper Extremity Venous Duplex  Result Date: 03/28/2018 UPPER VENOUS STUDY  Indications: Swelling Performing Technologist: Abram Sander RVS  Examination Guidelines: A complete evaluation includes B-mode imaging, spectral Doppler, color Doppler, and power Doppler as needed of all accessible portions of each vessel. Bilateral testing is considered an integral part of a complete examination. Limited examinations for reoccurring indications may be performed as noted.  Right Findings: +----------+------------+----------+---------+-----------+-------+ RIGHT     CompressiblePropertiesPhasicitySpontaneousSummary +----------+------------+----------+---------+-----------+-------+ IJV           Full                 Yes       Yes            +----------+------------+----------+---------+-----------+-------+ Subclavian    Full                 Yes       Yes            +----------+------------+----------+---------+-----------+-------+ Axillary      Full  Yes       Yes             +----------+------------+----------+---------+-----------+-------+ Brachial      Full                 Yes       Yes            +----------+------------+----------+---------+-----------+-------+ Radial        Full                                          +----------+------------+----------+---------+-----------+-------+ Ulnar         Full                                          +----------+------------+----------+---------+-----------+-------+ Cephalic      None                                   Acute  +----------+------------+----------+---------+-----------+-------+  Left Findings: +----------+------------+----------+---------+-----------+-------+ LEFT      CompressiblePropertiesPhasicitySpontaneousSummary +----------+------------+----------+---------+-----------+-------+ Subclavian    Full                 Yes       Yes            +----------+------------+----------+---------+-----------+-------+  Summary:  Right: No evidence of deep vein thrombosis in the upper extremity. Findings consistent with acute superficial vein thrombosis involving the right cephalic vein.  Left: No evidence of thrombosis in the subclavian.  *See table(s) above for measurements and observations.  Diagnosing physician: Servando Snare MD Electronically signed by Servando Snare MD on 03/28/2018 at 12:04:08 PM.    Final     Time Spent in minutes  30   Lala Lund M.D on 03/29/2018 at 11:24 AM  To page go to [email or web address] - password [email or web address]

## 2018-03-30 LAB — BASIC METABOLIC PANEL
Anion gap: 13 (ref 5–15)
BUN: 78 mg/dL — ABNORMAL HIGH (ref 8–23)
CO2: 22 mmol/L (ref 22–32)
Calcium: 8.5 mg/dL — ABNORMAL LOW (ref 8.9–10.3)
Chloride: 104 mmol/L (ref 98–111)
Creatinine, Ser: 6.68 mg/dL — ABNORMAL HIGH (ref 0.61–1.24)
GFR calc Af Amer: 8 mL/min — ABNORMAL LOW (ref >60)
GFR calc non Af Amer: 7 mL/min — ABNORMAL LOW (ref >60)
Glucose, Bld: 151 mg/dL — ABNORMAL HIGH (ref 70–99)
Potassium: 4.5 mmol/L (ref 3.5–5.1)
SODIUM: 139 mmol/L (ref 135–145)

## 2018-03-30 LAB — GLUCOSE, CAPILLARY
GLUCOSE-CAPILLARY: 113 mg/dL — AB (ref 70–99)
Glucose-Capillary: 141 mg/dL — ABNORMAL HIGH (ref 70–99)

## 2018-03-30 MED ORDER — "INSULIN SYRINGE-NEEDLE U-100 25G X 1"" 1 ML MISC": 0 refills | Status: DC

## 2018-03-30 MED ORDER — INSULIN GLARGINE 100 UNIT/ML ~~LOC~~ SOLN: 12.0000 [IU] | Freq: Every day | SUBCUTANEOUS | 0 refills | Status: DC

## 2018-03-30 MED ORDER — ISOSORBIDE MONONITRATE ER 30 MG PO TB24: 30.0000 mg | ORAL_TABLET | Freq: Every day | ORAL | 0 refills | Status: DC

## 2018-03-30 MED ORDER — IPRATROPIUM-ALBUTEROL 0.5-2.5 (3) MG/3ML IN SOLN: RESPIRATORY_TRACT | 0 refills | Status: DC

## 2018-03-30 MED ORDER — CARVEDILOL 3.125 MG PO TABS: 3.1250 mg | ORAL_TABLET | Freq: Two times a day (BID) | ORAL | 0 refills | Status: DC

## 2018-03-30 MED ORDER — BLOOD GLUCOSE MONITOR KIT: PACK | 1 refills | Status: AC

## 2018-03-30 MED ORDER — FUROSEMIDE 80 MG PO TABS: ORAL_TABLET | ORAL | 0 refills | Status: DC

## 2018-03-30 NOTE — Progress Notes (Signed)
Elijah White to be D/C'd home per MD order. Discussed with the patient and all questions fully answered.   VVS, Skin clean, dry and intact without evidence of skin break down, no evidence of skin tears noted.  IV catheter discontinued intact. Site without signs and symptoms of complications. Dressing and pressure applied.  An After Visit Summary was printed and given to the patient and daughter. Patient escorted via [place], and D/C home via private auto.  [name]  03/30/2018 3:19 PM

## 2018-03-30 NOTE — Discharge Instructions (Signed)
Follow with Primary MD [name], MD in 7 days   Get CBC, CMP, 2 view Chest X ray -  checked  by Primary MD in 5-7 days   Activity: As tolerated with Full fall precautions use walker/cane & assistance as needed  Disposition Home   Diet: Renal, 1.5 lit/ day fluid restriction  Special Instructions: If you have smoked or chewed Tobacco  in the last 2 yrs please stop smoking, stop any regular Alcohol  and or any Recreational drug use.  On your next visit with your primary care physician please Get Medicines reviewed and adjusted.  Please request your Prim.MD to go over all Hospital Tests and Procedure/Radiological results at the follow up, please get all Hospital records sent to your Prim MD by signing hospital release before you go home.  If you experience worsening of your admission symptoms, develop shortness of breath, life threatening emergency, suicidal or homicidal thoughts you must seek medical attention immediately by calling 911 or calling your MD immediately  if symptoms less severe.  You Must read complete instructions/literature along with all the possible adverse reactions/side effects for all the Medicines you take and that have been prescribed to you. Take any new Medicines after you have completely understood and accpet all the possible adverse reactions/side effects.

## 2018-03-30 NOTE — Progress Notes (Addendum)
Patient ID: Elijah White, male   DOB: Feb 19, 1941, 78 y.o.   MRN: [id number]  [facility] Progress Note   Assessment/ Plan:   1.  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  With good urine output with recent adjustment of diuretic therapy and likely established a lower renal baseline with improvement of volume status.  Does not have any acute indications for dialysis at this time (if required, this would be through a [facility] as his left BBF is immature).  He has a follow-up appointment on Tuesday with Dr. [name] at [facility]. 2.  Acute exacerbation of diastolic heart failure: Tolerating oral diuretics with net negative fluid balance and continued improvement of respiratory symptoms/physical exam. 3.  Anemia: This appears to be anemia of chronic kidney disease and possibly exacerbated by recent surgery.    Status post intravenous iron. 4.  Hypertension: Blood pressures elevated, back on antihypertensive therapy and will continue to monitor with diuresis. 5.  Secondary hyperparathyroidism: Calcium and phosphorus levels currently within acceptable range  Subjective:   Continues to have improvement of his shortness of breath/tolerance to exertion.   Objective:   BP 136/67   Pulse 79   Temp 98.4 F (36.9 C) (Oral)   Resp 18   Ht 5\' 5"  (1.651 m)   Wt 87 kg   SpO2 99%   BMI 31.92 kg/m   Intake/Output Summary (Last 24 hours) at 03/30/2018 1001 Last data filed at 03/30/2018 0942 Gross per 24 hour  Intake 383 ml  Output 1151 ml  Net -768 ml   Weight change:   Physical Exam: Gen: Comfortably sitting up in recliner. CVS: Pulse regular rhythm, normal rate, ESM over apex Resp: Left base rales, right side diminished breath sounds, no rhonchi Abd: Soft, obese, nontender Ext: 1+ lower extremity edema, improving upper extremity edema  Imaging: Vas [place] Lower Extremity Venous (dvt)  Result  Date: 03/28/2018  Lower Venous Study Indications: Swelling.  Performing Technologist: Abram Sander RVS  Examination Guidelines: A complete evaluation includes B-mode imaging, spectral Doppler, color Doppler, and power Doppler as needed of all accessible portions of each vessel. Bilateral testing is considered an integral part of a complete examination. Limited examinations for reoccurring indications may be performed as noted.  Right Venous Findings: +---------+---------------+---------+-----------+----------+-------+          CompressibilityPhasicitySpontaneityPropertiesSummary +---------+---------------+---------+-----------+----------+-------+ CFV      Full           Yes      Yes                          +---------+---------------+---------+-----------+----------+-------+ SFJ      Full                                                 +---------+---------------+---------+-----------+----------+-------+ FV Prox  Full                                                 +---------+---------------+---------+-----------+----------+-------+ FV Mid   Full                                                 +---------+---------------+---------+-----------+----------+-------+  FV DistalFull                                                 +---------+---------------+---------+-----------+----------+-------+ PFV      Full                                                 +---------+---------------+---------+-----------+----------+-------+ POP      Full           Yes      Yes                          +---------+---------------+---------+-----------+----------+-------+ PTV      Full                                                 +---------+---------------+---------+-----------+----------+-------+ PERO     Full                                                 +---------+---------------+---------+-----------+----------+-------+  Left Venous Findings:  +---+---------------+---------+-----------+----------+-------+    CompressibilityPhasicitySpontaneityPropertiesSummary +---+---------------+---------+-----------+----------+-------+ CFVFull           Yes      Yes                          +---+---------------+---------+-----------+----------+-------+    Summary: Right: There is no evidence of deep vein thrombosis in the lower extremity. No cystic structure found in the popliteal fossa. Left: No evidence of common femoral vein obstruction.  *See table(s) above for measurements and observations. Electronically signed by Servando Snare MD on 03/28/2018 at 12:03:04 PM.    Final    Vas [place] Upper Extremity Venous Duplex  Result Date: 03/28/2018 UPPER VENOUS STUDY  Indications: Swelling Performing Technologist: Abram Sander RVS  Examination Guidelines: A complete evaluation includes B-mode imaging, spectral Doppler, color Doppler, and power Doppler as needed of all accessible portions of each vessel. Bilateral testing is considered an integral part of a complete examination. Limited examinations for reoccurring indications may be performed as noted.  Right Findings: +----------+------------+----------+---------+-----------+-------+ RIGHT     CompressiblePropertiesPhasicitySpontaneousSummary +----------+------------+----------+---------+-----------+-------+ IJV           Full                 Yes       Yes            +----------+------------+----------+---------+-----------+-------+ Subclavian    Full                 Yes       Yes            +----------+------------+----------+---------+-----------+-------+ Axillary      Full                 Yes       Yes            +----------+------------+----------+---------+-----------+-------+ Brachial      Full  Yes       Yes            +----------+------------+----------+---------+-----------+-------+ Radial        Full                                           +----------+------------+----------+---------+-----------+-------+ Ulnar         Full                                          +----------+------------+----------+---------+-----------+-------+ Cephalic      None                                   Acute  +----------+------------+----------+---------+-----------+-------+  Left Findings: +----------+------------+----------+---------+-----------+-------+ LEFT      CompressiblePropertiesPhasicitySpontaneousSummary +----------+------------+----------+---------+-----------+-------+ Subclavian    Full                 Yes       Yes            +----------+------------+----------+---------+-----------+-------+  Summary:  Right: No evidence of deep vein thrombosis in the upper extremity. Findings consistent with acute superficial vein thrombosis involving the right cephalic vein.  Left: No evidence of thrombosis in the subclavian.  *See table(s) above for measurements and observations.  Diagnosing physician: Servando Snare MD Electronically signed by Servando Snare MD on 03/28/2018 at 12:04:08 PM.    Final     Labs: BMET Recent Labs  Lab 03/25/18 1150 03/25/18 1437 03/26/18 0356 03/27/18 0419 03/28/18 0457 03/29/18 0338 03/30/18 0352  NA 141  --  141 141 141 141 139  K 4.5  --  4.3 4.3 4.0 4.1 4.5  CL 110  --  112* 111 110 105 104  CO2 20*  --  18* 21* 21* 22 22  GLUCOSE 129*  --  134* 132* 108* 123* 151*  BUN 56*  --  61* 62* 65* 72* 78*  CREATININE 5.80* 5.80* 5.68* 5.78* 6.21* 6.48* 6.68*  CALCIUM 8.7*  --  8.6* 8.5* 8.7* 8.7* 8.5*  PHOS  --   --  4.5  --   --   --   --    CBC Recent Labs  Lab 03/25/18 1150 03/25/18 1437 03/26/18 0356  WBC 6.9 6.6 6.8  NEUTROABS 4.6  --   --   HGB 10.6* 10.4* 10.7*  HCT 34.7* 33.9* 32.4*  MCV 100.6* 100.3* 96.1  PLT 193 156 199    Medications:    . amLODipine  10 mg Oral Daily  . atorvastatin  10 mg Oral q1800  . brimonidine  1 drop Right Eye TID  . calcitRIOL  0.25 mcg Oral  Daily  . carvedilol  3.125 mg Oral BID WC  . dorzolamide-timolol  1 drop Right Eye BID  . furosemide  80 mg Oral BID  . gabapentin  100 mg Oral QHS  . heparin  5,000 Units Subcutaneous Q8H  . hydrALAZINE  100 mg Oral Q8H  . insulin aspart  0-15 Units Subcutaneous TID WC  . insulin aspart  0-5 Units Subcutaneous QHS  . latanoprost  1 drop Right Eye QHS  . nitroGLYCERIN  0.5 inch Topical Q6H  . pantoprazole  40 mg Oral Daily  . pilocarpine  1 drop Right Eye TID  . sodium chloride flush  3 mL Intravenous Q12H   Elmarie Shiley, MD 03/30/2018, 10:01 AM

## 2018-03-30 NOTE — Care Management Note (Signed)
Case Management Note  Patient Details  Name: CARZELL SALDIVAR MRN: [id number] Date of Birth: 1940/11/03  Subjective/Objective:                    Action/Plan:  Spoke w patient and his daughter. They would like to resume HH through [facility]. Oxygen and nebulizer ordered and will be delivered to room prior to DC. Daughter will provide transportation home.   Expected Discharge Date:  03/30/18               Expected Discharge Plan:     In-House Referral:     Discharge planning Services     Post Acute Care Choice:    Choice offered to:     DME Arranged:    DME Agency:     HH Arranged:    HH Agency:     Status of Service:     If discussed at [employer], dates discussed:    Additional Comments:  Carles Collet, RN 03/30/2018, 10:02 AM

## 2018-03-30 NOTE — Progress Notes (Signed)
SATURATION QUALIFICATIONS:   Patient Saturations on Room Air at Rest = 98%  Patient Saturations on Room Air while Ambulating = 96% 

## 2018-03-30 NOTE — Discharge Summary (Signed)
Elijah White HYQ:[id number] DOB: [birth year] DOA: 03/25/2018  PCP: [name], MD  Admit date: 03/25/2018  Discharge date: 03/30/2018  Admitted From: Home   Disposition:  Home   Recommendations for Outpatient Follow-up:   Follow up with PCP in 1-2 weeks  PCP Please obtain BMP/CBC, 2 view CXR in 1week,  (see Discharge instructions)   PCP Please follow up on the following pending results: follow BMP and CBGs closely   Home Health: PT,RN,Aide, S-Work   Equipment/Devices: o2  Consultations: Renal Discharge Condition: Stable   CODE STATUS: Full   Diet Recommendation: Renal diet with 1.2 L/day total fluid restriction    Chief Complaint  Patient presents with  . Shortness of Breath  . Facial Swelling     Brief history of present illness from the day of admission and additional interim summary    [name],with history of CKD 5 progressing towards ESRD, chronic diastolic CHF last known EF around 55% on recent echocardiogram, essential hypertension, DM type II, who lives at home by himself ambulates with the help of a walker, comes into the hospital with gradually progressive 1 week history of shortness of breath and orthopnea. He denies any other medical problems, specifically no fever chills, no productive cough, no chest pain or abdominal pain. He has also noticed gradual swelling in his arms and legs and multiple pounds of weight gain over the last several weeks.  Came to the ER for evaluation of his shortness of breath he was diagnosed with acute on chronic diastolic CHF, ARF on CKD 5, nephrology was consulted and I was requested to admit. Patient after wearing some oxygen and sitting up in bed is feeling a whole lot better.         Hospital Course   1.Acute on chronic diastolic CHF - EF around 55 to 60% on recent echocardiogram done late in 2019. Combination of underlying CKD 5 with minimal urine output on a daily basis, he is being treated with Nitropaste, now transition to oral Lasix along with Coreg and supplemental oxygen.  Excellent urine output, edema has almost completely resolved, at this point he will be transition to oral Lasix dose adjusted, Coreg, nighttime 2 L nasal cannula oxygen, oral Imdur and discharged home with home PT, RN, aide and Education officer, museum.  Follow with PCP and primary nephrologist within a week.  Counseled to quit smoking.  2.Mild borderline elevated troponin. Due to #1 above from demand ischemia, no chest pain on acute EKG. No further work-up needed.  3.ARF on CKD 5. Gradually progressive, discussed with nephrologist Dr. Posey Pronto continue diuresis and monitor.  Has left arm AV fistula placed recently likely is not mature yet.  He may progress to HD soon, explained to daughter and patient.  4.Dyslipidemia. Continue home dose statin.  5. Essential hypertension.  Was in poor control has been placed on Imdur,Coreg and Lasix, and home dose hydralazine blood pressure better will continue on these medications with PCP follow-up.  6.Anemia of chronic disease. Stable anemia panel continue to  monitor no need for transfusion.  7. DM type II. He is CKD 5 baseline and now getting close to ESRD, he was on Glucotrol which is not exactly the best medication in his situation, I have transitioned him to once a day Lantus for now.  Close monitoring of CBGs by PCP and home RN.  Adjust once a day Lantus as needed and add short-acting NovoLog if required.  Quested to check CBGs QA CHS maintain a logbook and follow with PCP within a week.   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  Accuchecks 4 times/day, Once in AM empty stomach and then before each meal. Log in all results and show them to your Prim.MD in 3 days. If any glucose reading is under 80 or above 300 call your Prim MD immidiately. Follow Low glucose instructions for glucose under 80 as instructed.   Special Instructions: If you have smoked or chewed Tobacco  in the last 2 yrs please stop smoking, stop any regular Alcohol  and or any Recreational drug use.  On your next visit with your primary care physician please Get Medicines reviewed and adjusted.  Please request your Prim.MD to go over all Hospital Tests and Procedure/Radiological results at the follow up, please get all Hospital records sent to your Prim MD by signing hospital release before you go home.  If you experience worsening of your admission symptoms, develop shortness of breath, life threatening emergency, suicidal or homicidal thoughts you must seek medical attention immediately by calling 911 or calling your MD immediately  if symptoms less severe.  You Must read complete instructions/literature along with all the possible adverse reactions/side effects for all the Medicines you take and that have been prescribed to you. Take any new Medicines after you have completely understood and accpet all  the possible adverse reactions/side effects.   Increase activity slowly   Complete by:  As directed       Discharge Medications   Allergies as of 03/30/2018   No Known Allergies     Medication List    STOP taking these medications   amLODipine 10 MG tablet Commonly known as:  NORVASC   glipiZIDE 5 MG tablet Commonly known as:  GLUCOTROL   HYDROcodone-acetaminophen 5-325 MG tablet Commonly known as:  NORCO     TAKE these medications   acetaminophen 500 MG tablet Commonly known as:  TYLENOL Take 1,000 mg by mouth every 6 (six) hours as needed for headache (pain).   atorvastatin 10 MG tablet Commonly known as:  LIPITOR TAKE 1 TABLET BY MOUTH ONCE DAILY What  changed:  when to take this   bimatoprost 0.01 % Soln Commonly known as:  LUMIGAN Place 1 drop into the right eye at bedtime.   blood glucose meter kit and supplies Kit Dispense based on patient and insurance preference. Use up to four times daily as directed. (FOR ICD-9 250.00, 250.01). For QAC - HS accuchecks.   brimonidine 0.15 % ophthalmic solution Commonly known as:  ALPHAGAN Place 1 drop into the right eye 3 (three) times daily.   calcitRIOL 0.25 MCG capsule Commonly known as:  ROCALTROL Take 0.25 mcg by mouth daily.   carvedilol 3.125 MG tablet Commonly known as:  COREG Take 1 tablet (3.125 mg total) by mouth 2 (two) times daily with a meal.   dorzolamide-timolol 22.3-6.8 MG/ML ophthalmic solution Commonly known as:  COSOPT Place 1 drop into the right eye 2 (two) times daily.   esomeprazole 40 MG capsule Commonly known as:  NEXIUM TAKE 1 CAPSULE BY MOUTH ONCE DAILY What changed:  when to take this   furosemide 80 MG tablet Commonly known as:  LASIX Take 1 pill '80mg'$  in am and 1/2 pill '40mg'$  in the pm. What changed:    how much to take  how to take this  when to take this  additional instructions   gabapentin 100 MG capsule Commonly known as:  NEURONTIN TAKE ONE CAPSULE BY MOUTH AT  BEDTIME   hydrALAZINE 25 MG tablet Commonly known as:  APRESOLINE TAKE 1 TABLET(25 MG) BY MOUTH TWICE DAILY What changed:  See the new instructions.   insulin glargine 100 UNIT/ML injection Commonly known as:  LANTUS Inject 0.12 mLs (12 Units total) into the skin daily. Dispense insulin pen if approved, if not dispense as needed syringes and needles for 1 month supply. Can switch to Levemir. Diagnosis E 11.65.   Insulin Syringe-Needle U-100 25G X 1" 1 ML Misc For 4 times a day insulin SQ, 1 month supply. Diagnosis E11.65   ipratropium-albuterol 0.5-2.5 (3) MG/3ML Soln Commonly known as:  DUONEB Use twice a day scheduled and every 4 hours as needed for shortness of breath and wheezing   isosorbide mononitrate 30 MG 24 hr tablet Commonly known as:  IMDUR Take 1 tablet (30 mg total) by mouth daily for 30 days.   pilocarpine 4 % ophthalmic solution Commonly known as:  PILOCAR Place 1 drop into the right eye 3 (three) times daily.            Durable Medical Equipment  (From admission, onward)         Start     Ordered   03/30/18 0848  For home use only DME Nebulizer/meds  Once    Question:  Patient needs a nebulizer to treat with the following condition  Answer:  CHF (congestive heart failure) ([place])   03/30/18 0847   03/30/18 0846  DME Oxygen  Once    Question Answer Comment  Mode or (Route) Nasal cannula   Liters per Minute 2   Frequency Continuous (stationary and portable oxygen unit needed)   Oxygen conserving device Yes   Oxygen delivery system Gas      03/30/18 0846          Follow-up Information    [name], MD. Schedule an appointment as soon as possible for a visit in 1 week(s).   Specialty:  Internal Medicine Contact information: [address] [place] [address] [phone]           Major procedures and Radiology  Reports - PLEASE review detailed and final reports thoroughly  -     TTE - 09/2017 - Left ventricle: The  cavity size was normal. Wall thickness was increased in a pattern of mild LVH. Systolic function was vigorous. The estimated ejection fraction was in the range of 65% to 70%. Wall motion was normal; there were no regional wall motion abnormalities. Doppler parameters are consistent with abnormal left ventricular relaxation (grade 1 diastolicdysfunction).    Dg Chest Portable 1 View  Result Date: 03/25/2018 CLINICAL DATA:  Acute shortness of breath.  Patient on dialysis. EXAM: PORTABLE CHEST 1 VIEW COMPARISON:  09/01/2017 and prior radiographs FINDINGS: Mild cardiomegaly with pulmonary vascular congestion noted. Bilateral perihilar airspace opacities noted, LEFT-greater-than-RIGHT. There may be trace bilateral pleural effusions present. There is no evidence of pneumothorax or acute bony abnormality. IMPRESSION: Bilateral perihilar airspace opacities, LEFT-greater-than-RIGHT, slightly favor asymmetric pulmonary edema over infection but correlate clinically. Mild cardiomegaly with pulmonary vascular congestion and trace bilateral pleural effusions. Electronically Signed   By: Margarette Canada M.D.   On: 03/25/2018 11:51   Vas [place] Upper Extremity Arterial Duplex  Result Date: 03/03/2018 UPPER EXTREMITY DUPLEX STUDY Indications: Evaluation prior to dialysis access.  Risk Factors: Hypertension, hyperlipidemia, Diabetes, current smoker. Performing Technologist: Delorise Shiner RVT  Examination Guidelines: A complete evaluation includes B-mode imaging, spectral Doppler, color Doppler, and power Doppler as needed of all accessible portions of each vessel. Bilateral testing is considered an integral part of a complete examination. Limited examinations for reoccurring indications may be performed as noted.  Right Pre-Dialysis Findings: +-----------------------+----------+--------------------+---------+--------+ Location               PSV (cm/s)Intralum. Diam. (cm)Waveform Comments  +-----------------------+----------+--------------------+---------+--------+ Brachial Antecub. fossa100       0.71                triphasic         +-----------------------+----------+--------------------+---------+--------+ Radial Art at Wrist    105       0.39                triphasic         +-----------------------+----------+--------------------+---------+--------+ Ulnar Art at Wrist     90        0.26                triphasic         +-----------------------+----------+--------------------+---------+--------+ Left Pre-Dialysis Findings: +-----------------------+----------+--------------------+---------+--------+ Location               PSV (cm/s)Intralum. Diam. (cm)Waveform Comments +-----------------------+----------+--------------------+---------+--------+ Brachial Antecub. fossa89        0.70                triphasic         +-----------------------+----------+--------------------+---------+--------+ Radial Art at Wrist    116       0.38                triphasic         +-----------------------+----------+--------------------+---------+--------+ Ulnar Art at Wrist     94        0.23                triphasic         +-----------------------+----------+--------------------+---------+--------+  Summary:  Right: No obstruction visualized in the right upper extremity. Left: No obstruction visualized in the left upper extremity. *See table(s) above for measurements and observations. Electronically signed by Servando Snare MD on 03/03/2018 at 9:29:22 AM.    Final    Vas [place] Lower  Extremity Venous (dvt)  Result Date: 03/28/2018  Lower Venous Study Indications: Swelling.  Performing Technologist: Abram Sander RVS  Examination Guidelines: A complete evaluation includes B-mode imaging, spectral Doppler, color Doppler, and power Doppler as needed of all accessible portions of each vessel. Bilateral testing is considered an integral part of a complete examination. Limited  examinations for reoccurring indications may be performed as noted.  Right Venous Findings: +---------+---------------+---------+-----------+----------+-------+          CompressibilityPhasicitySpontaneityPropertiesSummary +---------+---------------+---------+-----------+----------+-------+ CFV      Full           Yes      Yes                          +---------+---------------+---------+-----------+----------+-------+ SFJ      Full                                                 +---------+---------------+---------+-----------+----------+-------+ FV Prox  Full                                                 +---------+---------------+---------+-----------+----------+-------+ FV Mid   Full                                                 +---------+---------------+---------+-----------+----------+-------+ FV DistalFull                                                 +---------+---------------+---------+-----------+----------+-------+ PFV      Full                                                 +---------+---------------+---------+-----------+----------+-------+ POP      Full           Yes      Yes                          +---------+---------------+---------+-----------+----------+-------+ PTV      Full                                                 +---------+---------------+---------+-----------+----------+-------+ PERO     Full                                                 +---------+---------------+---------+-----------+----------+-------+  Left Venous Findings: +---+---------------+---------+-----------+----------+-------+    CompressibilityPhasicitySpontaneityPropertiesSummary +---+---------------+---------+-----------+----------+-------+ CFVFull           Yes      Yes                          +---+---------------+---------+-----------+----------+-------+  Summary: Right: There is no evidence of deep vein thrombosis in the  lower extremity. No cystic structure found in the popliteal fossa. Left: No evidence of common femoral vein obstruction.  *See table(s) above for measurements and observations. Electronically signed by Servando Snare MD on 03/28/2018 at 12:03:04 PM.    Final    Vas [place] Upper Ext Vein Mapping (pre-op Avf)  Result Date: 03/03/2018 UPPER EXTREMITY VEIN MAPPING  Indications: Pre-access. Performing Technologist: Delorise Shiner RVT  Examination Guidelines: A complete evaluation includes B-mode imaging, spectral Doppler, color Doppler, and power Doppler as needed of all accessible portions of each vessel. Bilateral testing is considered an integral part of a complete examination. Limited examinations for reoccurring indications may be performed as noted. +-----------------+-------------+----------+--------+ Right Cephalic   Diameter (cm)Depth (cm)Findings +-----------------+-------------+----------+--------+ Shoulder             0.20                        +-----------------+-------------+----------+--------+ Prox upper arm       0.28                        +-----------------+-------------+----------+--------+ Mid upper arm        0.22                        +-----------------+-------------+----------+--------+ Dist upper arm       0.23                        +-----------------+-------------+----------+--------+ Antecubital fossa    0.31                        +-----------------+-------------+----------+--------+ Prox forearm         0.24                        +-----------------+-------------+----------+--------+ Mid forearm          0.23                        +-----------------+-------------+----------+--------+ Dist forearm         0.22                        +-----------------+-------------+----------+--------+ +-----------------+-------------+----------+-------------------------------+ Right Basilic    Diameter (cm)Depth (cm)           Findings              +-----------------+-------------+----------+-------------------------------+ Prox upper arm       0.50                                               +-----------------+-------------+----------+-------------------------------+ Mid upper arm        0.32                                               +-----------------+-------------+----------+-------------------------------+ Dist upper arm       0.34                                               +-----------------+-------------+----------+-------------------------------+  Antecubital fossa    0.24                                               +-----------------+-------------+----------+-------------------------------+ Prox forearm                            subdivides to multiple branches +-----------------+-------------+----------+-------------------------------+ +-----------------+-------------+----------+--------+ Left Cephalic    Diameter (cm)Depth (cm)Findings +-----------------+-------------+----------+--------+ Shoulder             0.22                        +-----------------+-------------+----------+--------+ Prox upper arm       0.24                        +-----------------+-------------+----------+--------+ Mid upper arm        0.30                        +-----------------+-------------+----------+--------+ Dist upper arm       0.31                        +-----------------+-------------+----------+--------+ Antecubital fossa    0.32                        +-----------------+-------------+----------+--------+ Prox forearm         0.23                        +-----------------+-------------+----------+--------+ Mid forearm          0.21                        +-----------------+-------------+----------+--------+ Dist forearm         0.26                        +-----------------+-------------+----------+--------+ +-----------------+-------------+----------+--------+ Left Basilic      Diameter (cm)Depth (cm)Findings +-----------------+-------------+----------+--------+ Mid upper arm        0.38                        +-----------------+-------------+----------+--------+ Dist upper arm       0.30                        +-----------------+-------------+----------+--------+ Antecubital fossa    0.28                        +-----------------+-------------+----------+--------+ Prox forearm         0.29                        +-----------------+-------------+----------+--------+ Summary: Right: Basilic and cephalic veins are identified with diameters as        listed above. Left: Basilic and cephalic veins are identified with diameters as       listed above. *See table(s) above for measurements and observations.  Diagnosing physician: Servando Snare MD Electronically signed by Servando Snare MD on 03/03/2018 at 9:28:38 AM.    Final    Vas [place] Upper Extremity Venous Duplex  Result Date: 03/28/2018 UPPER  VENOUS STUDY  Indications: Swelling Performing Technologist: Abram Sander RVS  Examination Guidelines: A complete evaluation includes B-mode imaging, spectral Doppler, color Doppler, and power Doppler as needed of all accessible portions of each vessel. Bilateral testing is considered an integral part of a complete examination. Limited examinations for reoccurring indications may be performed as noted.  Right Findings: +----------+------------+----------+---------+-----------+-------+ RIGHT     CompressiblePropertiesPhasicitySpontaneousSummary +----------+------------+----------+---------+-----------+-------+ IJV           Full                 Yes       Yes            +----------+------------+----------+---------+-----------+-------+ Subclavian    Full                 Yes       Yes            +----------+------------+----------+---------+-----------+-------+ Axillary      Full                 Yes       Yes             +----------+------------+----------+---------+-----------+-------+ Brachial      Full                 Yes       Yes            +----------+------------+----------+---------+-----------+-------+ Radial        Full                                          +----------+------------+----------+---------+-----------+-------+ Ulnar         Full                                          +----------+------------+----------+---------+-----------+-------+ Cephalic      None                                   Acute  +----------+------------+----------+---------+-----------+-------+  Left Findings: +----------+------------+----------+---------+-----------+-------+ LEFT      CompressiblePropertiesPhasicitySpontaneousSummary +----------+------------+----------+---------+-----------+-------+ Subclavian    Full                 Yes       Yes            +----------+------------+----------+---------+-----------+-------+  Summary:  Right: No evidence of deep vein thrombosis in the upper extremity. Findings consistent with acute superficial vein thrombosis involving the right cephalic vein.  Left: No evidence of thrombosis in the subclavian.  *See table(s) above for measurements and observations.  Diagnosing physician: Servando Snare MD Electronically signed by Servando Snare MD on 03/28/2018 at 12:04:08 PM.    Final     Micro Results     No results found for this or any previous visit (from the past 240 hour(s)).  Today   Subjective    Elijah White today has no headache,no chest abdominal pain,no new weakness tingling or numbness, feels much better wants to go home today.     Objective   Blood pressure 134/64, pulse 78, temperature 98.4 F (36.9 C), temperature source Oral, resp. rate 18, height '5\' 5"'$  (1.651 m), weight 87 kg, SpO2 99 %.   Intake/Output Summary (Last  24 hours) at 03/30/2018 0847 Last data filed at 03/30/2018 0547 Gross per 24 hour  Intake 723 ml  Output 1150 ml    Net -427 ml    Exam  Awake Alert, Oriented x 3, No new F.N deficits, Normal affect [place].AT,PERRAL Supple Neck,No JVD, No cervical lymphadenopathy appriciated.  Symmetrical Chest wall movement, Good air movement bilaterally, CTAB RRR,No Gallops,Rubs or new Murmurs, No Parasternal Heave +ve B.Sounds, Abd Soft, Non tender, No organomegaly appriciated, No rebound -guarding or rigidity. No Cyanosis, Clubbing or edema, No new Rash or bruise   Data Review   CBC w Diff:  Lab Results  Component Value Date   WBC 6.8 03/26/2018   HGB 10.7 (L) 03/26/2018   HGB 12.7 (L) 02/19/2018   HCT 32.4 (L) 03/26/2018   HCT 38.7 02/19/2018   PLT 199 03/26/2018   PLT 230 02/19/2018   LYMPHOPCT 18 03/25/2018   MONOPCT 14 03/25/2018   EOSPCT 1 03/25/2018   BASOPCT 0 03/25/2018    CMP:  Lab Results  Component Value Date   NA 139 03/30/2018   NA 144 02/19/2018   K 4.5 03/30/2018   CL 104 03/30/2018   CO2 22 03/30/2018   BUN 78 (H) 03/30/2018   BUN 39 (H) 02/19/2018   CREATININE 6.68 (H) 03/30/2018   PROT 6.9 02/19/2018   ALBUMIN 2.9 (L) 03/26/2018   ALBUMIN 4.2 02/19/2018   BILITOT 0.5 02/19/2018   ALKPHOS 126 (H) 02/19/2018   AST 21 02/19/2018   ALT 13 02/19/2018  . Lab Results  Component Value Date   HGBA1C 6.3 (H) 03/25/2018     Total Time in preparing paper work, data evaluation and todays exam - 17 minutes  Lala Lund M.D on 03/30/2018 at 8:47 AM  [facility]   Office  [phone]

## 2018-03-30 NOTE — Progress Notes (Signed)
Patient complaining of SOB while lying still in bed. 02 sat  was 96% on R/A. Patient placed on 2L/Cuming for comfort. Will continue to monitor.

## 2018-03-31 ENCOUNTER — Other Ambulatory Visit: Payer: Self-pay

## 2018-03-31 DIAGNOSIS — N185 Chronic kidney disease, stage 5: Secondary | ICD-10-CM

## 2018-04-01 ENCOUNTER — Telehealth: Payer: Self-pay

## 2018-04-01 NOTE — Telephone Encounter (Signed)
Transition Care Management Follow-up Telephone Call  Date of discharge and from where:03/25/2018 [facility]  How have you been since you were released from the hospital? The patient said that he is doing goo.  Any questions or concerns? No  Items Reviewed:  Did the pt receive and understand the discharge instructions provided? Yes  Medications obtained and verified? Yes  Any new allergies since your discharge? No  Dietary orders reviewed? Yes   Do you have support at home?   Other (ie: DME, Home Health, etc) The patient said that his daughter helps him.  Functional Questionnaire: (I = Independent and D = Dependent) ADL's: I   Bathing/Dressing- I   Meal Prep- I  Eating- I  Maintaining continence- I  Transferring/Ambulation- I  Managing Meds- D   Follow up appointments reviewed:    PCP Hospital f/u appt confirmed? yes      Specialist Hospital f/u appt confirmed?  N/A  Are transportation arrangements needed? No  If their condition worsens, is the pt aware to call  their PCP or go to the ED?  Yes  Was the patient provided with contact information for the PCP's office or ED? yes  Was the pt encouraged to call back with questions or concerns? yes

## 2018-04-08 ENCOUNTER — Other Ambulatory Visit: Payer: Self-pay | Admitting: Internal Medicine

## 2018-04-08 DIAGNOSIS — I10 Essential (primary) hypertension: Secondary | ICD-10-CM

## 2018-04-08 DIAGNOSIS — E1165 Type 2 diabetes mellitus with hyperglycemia: Secondary | ICD-10-CM

## 2018-04-10 ENCOUNTER — Ambulatory Visit (INDEPENDENT_AMBULATORY_CARE_PROVIDER_SITE_OTHER): Payer: Medicare Other

## 2018-04-10 DIAGNOSIS — I10 Essential (primary) hypertension: Secondary | ICD-10-CM

## 2018-04-10 DIAGNOSIS — E1121 Type 2 diabetes mellitus with diabetic nephropathy: Secondary | ICD-10-CM | POA: Diagnosis not present

## 2018-04-10 DIAGNOSIS — E1165 Type 2 diabetes mellitus with hyperglycemia: Secondary | ICD-10-CM | POA: Diagnosis not present

## 2018-04-10 NOTE — Chronic Care Management (AMB) (Signed)
Care Management Note   Elijah White is a 78 y.o. year old male who sees [name], MD for primary care. Dr. Baird Cancer asked the CCM team to consult the patient for assistance with chronic disease management related to Diabetes Mellitus with CKD stage 5, Acute on chronic diastolic CHF, Dyslipidemia, Essential Hypertension, and Glaucoma. Referral was placed.   Review of patient status, including review of consultants reports, relevant laboratory and other test results, and collaboration with appropriate care team members and the patient's provider was performed as part of comprehensive patient evaluation and provision of chronic care management services.   Telephone outreach completed today to Elijah White and his daughter Elijah White per his request. Several CCM needs are identified, daughter agree's her father will benefit from receiving these services.   Goals    . "He's not taking anything for his Diabetes at this time"     Current Barriers:   Knowledge deficit related to recommendations for best treatment of Diabetes Mellitus  Functionally limited with self-monitoring CBG's due to poor vision secondary to Glaucoma  Nurse Case Manager Clinical Goal(s): Over the next 30 days, patient/daughter will verbalize understanding of MD recommendations for Pharmacological Management and Self Health Management of Diabetes.  Interventions:   Enrolled patient in CCM program  Scheduled face to face visit with CCM nurse  Reinforced with daughter the importance of discussing Diabetes treatment plan with PCP visit scheduled for 04/11/18  Patient Self Care Activities:   Patient has limited ability to self-monitor CBG's due to visual impairment  *initial goal documentation   . "I'm really concerned about his home safety and ability to self-manage due his vision loss"     Current Barriers:  . Functional limitations due to poor vision secondary to Glaucoma . Patient lives alone  Limited  support/caregiver options  Nurse Case Manager Clinical Goal(s): Over the next 30 days, patient and caregiver Elijah White, will verbalize increased knowledge and understanding related to home safety and availability of community agencies offering assistance for the visually impaired.  Interventions:   Telephonic outreach for initial introduction of CCM services to patient/daughter Elijah White (per patients request) . Provided active listening to daughter and validated her feelings of concern for her fathers health and safety  Scheduled initial Face to Face encounter with patient and daughter at providers office for 04/11/18 . Plan to make appropriate referrals needed  . Reinforce the need to use community agencies if indicated . Reinforce physician's explanation of medical management of condition . Help patient and or caregiver identify and make arrangements at home to promote patients independent living and sense of safety  Patient Self Care Activities:  . Patient self-administers his medications with difficulty due to poor vision (daughter is filling his pill box)  Patient attends all scheduled MD appointments (daughter provides transportation and accompanies the patient)  *initial goal documentation   . "My father wants to ask Dr. Baird Cancer about his CHF...he was told he doesn't have this condition"     Current Barriers:   Knowledge Deficits related to CHF disease process and self health management strategies - patient does not weigh self daily and does not understand CHF disease process  Functional limitations due poor vision (see other goal)  Nurse Case Manager Clinical Goal(s): Over the next 30 days, patient and caregiver will verbalize basic understanding of CHF disease process and self health management plan as evidenced by verbalization of basic understanding of CHF diagnosis, adherence to daily weight measurement, reporting findings outside established parameters,  and adherence to  prescribed medication regimen.   Interventions:   Evaluate and collaborate with PCP to obtain recommended/established treatment plan related to CHF   Scheduled patient for face to face visit with CCM team on 04/11/18 (patient/daughter will come to office at approximately 2:30pm)  Plan to provide basic education re: the importance of daily weights, recording, and contacting CCM team or provider team re: weight gain of 3# overnight or 5# in a week  Patient Self Care Activities:  . Attends scheduled appointments (daughter transports and attends with patient)  *initial goal documentation   Elijah White and daughter Elijah White was given information about Chronic Care Management services today including:  1. CCM service includes personalized support from designated clinical staff supervised by his physician, including individualized plan of care and coordination with other care providers 2. 24/7 contact phone numbers for assistance for urgent and routine care needs. 3. Service will only be billed when office clinical staff spend 20 minutes or more in a month to coordinate care. 4. Only one practitioner may furnish and bill the service in a calendar month. 5. The patient may stop CCM services at any time (effective at the end of the month) by phone call to the office staff. 6. The patient will be responsible for cost sharing (co-pay) of up to 20% of the service fee (after annual deductible is met).  Patient agreed to services and verbal consent obtained.    Follow Up Plan: Face to Face appointment with CCM team member scheduled for: 04/11/18   Barb Merino, [facility] Care Management Coordinator [place] [facility]  Direct Phone: [phone]

## 2018-04-10 NOTE — Patient Instructions (Addendum)
Visit Information  Goals    . "He's not taking anything for his Diabetes at this time"     Current Barriers:   Knowledge deficit related to recommendations for best treatment of Diabetes Mellitus  Functionally limited with self-monitoring CBG's due to poor vision secondary to Glaucoma  Nurse Case Manager Clinical Goal(s): Over the next 30 days, patient/daughter will verbalize understanding of MD recommendations for Pharmacological Management and Self Health Management of Diabetes.  Interventions:   Enrolled patient in CCM program  Scheduled face to face visit with CCM nurse  Reinforced with daughter the importance of discussing Diabetes treatment plan with PCP visit scheduled for 04/11/18  Patient Self Care Activities:   Patient has limited ability to self-monitor CBG's due to visual impairment  *initial goal documentation   . "I'm really concerned about his home safety and ability to self-manage due his vision loss"     Current Barriers:  . Functional limitations due to poor vision secondary to Glaucoma . Patient lives alone  Limited support/caregiver options  Nurse Case Manager Clinical Goal(s): Over the next 30 days, patient and caregiver [name], will verbalize increased knowledge and understanding related to home safety and availability of community agencies offering assistance for the visually impaired.  Interventions:   Telephonic outreach for initial introduction of CCM services to patient/daughter [name] (per patients request) . Provided active listening to daughter and validated her feelings of concern for her fathers health and safety  Scheduled initial Face to Face encounter with patient and daughter at providers office for 04/11/18 . Plan to make appropriate referrals needed  . Reinforce the need to use community agencies if indicated . Reinforce physician's explanation of medical management of condition . Help patient and or caregiver identify and make  arrangements at home to promote patients independent living and sense of safety  Patient Self Care Activities:  . Patient self-administers his medications with difficulty due to poor vision (daughter is filling his pill box)  Patient attends all scheduled MD appointments (daughter provides transportation and accompanies the patient)  *initial goal documentation   . "My father wants to ask Dr. Baird Cancer about his CHF...he was told he doesn't have this condition"     Current Barriers:   Knowledge Deficits related to CHF disease process and self health management strategies - patient does not weigh self daily and does not understand CHF disease process  Functional limitations due poor vision (see other goal)  Nurse Case Manager Clinical Goal(s): Over the next 30 days, patient and caregiver will verbalize basic understanding of CHF disease process and self health management plan as evidenced by verbalization of basic understanding of CHF diagnosis, adherence to daily weight measurement, reporting findings outside established parameters, and adherence to prescribed medication regimen.   Interventions:   Evaluate and collaborate with PCP to obtain recommended/established treatment plan related to CHF   Scheduled patient for face to face visit with CCM team on 04/11/18 (patient/daughter will come to office at approximately 2:30pm)  Plan to provide basic education re: the importance of daily weights, recording, and contacting CCM team or provider team re: weight gain of 3# overnight or 5# in a week  Patient Self Care Activities:  . Attends scheduled appointments (daughter transports and attends with patient)  *initial goal documentation   Mr. Barkan and daughter [name] was given information about Chronic Care Management services today including:  1. CCM service includes personalized support from designated clinical staff supervised by his physician, including individualized plan of  care and  coordination with other care providers 2. 24/7 contact phone numbers for assistance for urgent and routine care needs. 3. Service will only be billed when office clinical staff spend 20 minutes or more in a month to coordinate care. 4. Only one practitioner may furnish and bill the service in a calendar month. 5. The patient may stop CCM services at any time (effective at the end of the month) by phone call to the office staff. 6. The patient will be responsible for cost sharing (co-pay) of up to 20% of the service fee (after annual deductible is met).  Patient agreed to services and verbal consent obtained.   The patient verbalized understanding of instructions provided today and declined a print copy of patient instruction materials.   Face to Face appointment with CCM team member scheduled for:  04/11/18 at 2:30 PM  Barb Merino, [facility] Care Management Coordinator [place] [facility]  Direct Phone: [phone]

## 2018-04-11 ENCOUNTER — Inpatient Hospital Stay: Payer: Medicare Other | Admitting: Internal Medicine

## 2018-04-16 ENCOUNTER — Ambulatory Visit: Payer: Self-pay

## 2018-04-16 ENCOUNTER — Encounter: Payer: Self-pay | Admitting: Internal Medicine

## 2018-04-16 ENCOUNTER — Ambulatory Visit (INDEPENDENT_AMBULATORY_CARE_PROVIDER_SITE_OTHER): Payer: Medicare Other | Admitting: Internal Medicine

## 2018-04-16 VITALS — BP 126/74 | HR 64 | Temp 97.7°F | Ht 63.0 in | Wt 191.6 lb

## 2018-04-16 DIAGNOSIS — N185 Chronic kidney disease, stage 5: Secondary | ICD-10-CM

## 2018-04-16 DIAGNOSIS — N183 Chronic kidney disease, stage 3 (moderate): Principal | ICD-10-CM

## 2018-04-16 DIAGNOSIS — I5032 Chronic diastolic (congestive) heart failure: Secondary | ICD-10-CM

## 2018-04-16 DIAGNOSIS — H5461 Unqualified visual loss, right eye, normal vision left eye: Secondary | ICD-10-CM

## 2018-04-16 DIAGNOSIS — Z09 Encounter for follow-up examination after completed treatment for conditions other than malignant neoplasm: Secondary | ICD-10-CM

## 2018-04-16 DIAGNOSIS — I130 Hypertensive heart and chronic kidney disease with heart failure and stage 1 through stage 4 chronic kidney disease, or unspecified chronic kidney disease: Secondary | ICD-10-CM

## 2018-04-16 DIAGNOSIS — E1122 Type 2 diabetes mellitus with diabetic chronic kidney disease: Secondary | ICD-10-CM | POA: Diagnosis not present

## 2018-04-16 DIAGNOSIS — N179 Acute kidney failure, unspecified: Secondary | ICD-10-CM

## 2018-04-16 DIAGNOSIS — E6609 Other obesity due to excess calories: Secondary | ICD-10-CM

## 2018-04-16 DIAGNOSIS — Z6833 Body mass index (BMI) 33.0-33.9, adult: Secondary | ICD-10-CM

## 2018-04-16 NOTE — Chronic Care Management (AMB) (Signed)
  Chronic Care Management    Clinical Social Work General Note  04/16/2018 Name: Elijah White MRN: [id number] DOB: 1940/09/14   Referred by: PCP, Dr.Sanders  Referral for assistance with community resources  Review of patient status, including review of consultants reports, relevant laboratory and other test results, and collaboration with appropriate care team members and the patient's provider was performed as part of comprehensive patient evaluation and provision of chronic care management services.    Last CCM Appointment: with RN Case Manager on 04/16/2018   Goals Addressed    . "I need help getting a life alert for dad" (pt-stated)       Current Barriers:  . Limited access to caregiver- requests life alert system  Clinical Social Work Clinical Goal(s): Over the next 14 days, client will work with BSW to address concerns related to life alert system  Interventions: . Client interviewed and appropriate assessments performed, Provided client with information about health plan benefits and Collaborated with RN Case Manager re: comprehensive case management needs  Patient Self Care Activities:  . Daughter/primary caregiver is concerned about patients ability to independently call for help if needed.  Plan:  . BSW will review patient payor benefits related to life alert system . BSW will outreach the patients daughter/primary caregiver to discuss findings  *initial goal documentation     . "I need to make sure he will have transportation if he has to start dialysis" (pt-stated)       Current Barriers:  . Future transportation concerns (potential need for dialysis transportation)  Clinical Social Work Clinical Goal(s): Over the next 30 days, client will work with [employer] to address concerns related to transportation needs  Interventions: . Assistingclient/caregiver with obtaining information about health plan benefits  Patient Self Care Activities:  . Dtr currently can  provide transportation to all appointments but shares concerns re: transportation needs should patient start dialysis  Plan:  . Collaboration with health plan, RNCM, patient/caregiver re: transportation needs . Collection of information from health plan re: transportation benefit . Follow up call to patient/caregiver scheduled  *initial goal documentation     . "I want to make sure we have the right Medicaid in place if we ever need to go to assisted living" (pt-stated)       Current Barriers:  . Level of care concerns  Clinical Social Work Clinical Goal(s): Over the next 30 days, client will work with BSW to address concerns related to level of care and associated care coordination needs  Interventions: . Client interviewed and appropriate assessments performed   . provided education to client/caregiver regarding health plan benefits  Patient Self Care Activities:  . Currently able to perform ADL's independently but family has concerns about general decline and ability to perform self care activities and ability to maintain independence  Plan:  . Provide client/caregiver with benefit information re: current health plan . Education re: options for long term level of care needs  *initial goal documentation        Follow Up Plan: Appointment scheduled for SW follow up with client by phone within 2 weeks.        [name], BSW, CDP [facility] Care Management Social Worker [phone]

## 2018-04-16 NOTE — Patient Instructions (Signed)
Visit Information  Goals    . "He's not taking anything for his Diabetes at this time"     Current Barriers:   Knowledge deficit related to recommendations for best treatment of Diabetes Mellitus  Functionally limited with self-monitoring CBG's due to poor vision secondary to Glaucoma  Nurse Case Manager Clinical Goal(s): Over the next 30 days, patient/daughter will verbalize understanding of MD recommendations for Pharmacological Management and Self Health Management of Diabetes.  Interventions:   Enrolled patient in CCM program  Scheduled face to face visit with CCM nurse  Reinforced with daughter the importance of discussing Diabetes treatment plan with PCP visit scheduled for 04/11/18  Patient Self Care Activities:   Patient has limited ability to self-monitor CBG's due to visual impairment  *initial goal documentation      . "I need help getting a life alert for dad" (pt-stated)     Current Barriers:  . Limited access to caregiver- requests life alert system  Clinical Social Work Clinical Goal(s): Over the next 14 days, client will work with BSW to address concerns related to life alert system  Interventions: . Client interviewed and appropriate assessments performed, Provided client with information about health plan benefits and Collaborated with RN Case Manager re: comprehensive case management needs  Patient Self Care Activities:  . Daughter/primary caregiver is concerned about patients ability to independently call for help if needed.  Plan:  . BSW will review patient payor benefits related to life alert system . BSW will outreach the patients daughter/primary caregiver to discuss findings  *initial goal documentation    . "I need to make sure he will have transportation if he has to start dialysis" (pt-stated)     Current Barriers:  . Future transportation concerns (potential need for dialysis transportation)  Clinical Social Work Clinical Goal(s): Over the  next 30 days, client will work with [employer] to address concerns related to transportation needs  Interventions: . Assistingclient/caregiver with obtaining information about health plan benefits  Patient Self Care Activities:  . Dtr currently can provide transportation to all appointments but shares concerns re: transportation needs should patient start dialysis  Plan:  . Collaboration with health plan, RNCM, patient/caregiver re: transportation needs . Collection of information from health plan re: transportation benefit . Follow up call to patient/caregiver scheduled  *initial goal documentation    . "I want to make sure we have the right Medicaid in place if we ever need to go to assisted living" (pt-stated)     Current Barriers:  . Level of care concerns  Clinical Social Work Clinical Goal(s): Over the next 30 days, client will work with BSW to address concerns related to level of care and associated care coordination needs  Interventions: . Client interviewed and appropriate assessments performed   . provided education to client/caregiver regarding health plan benefits  Patient Self Care Activities:  . Currently able to perform ADL's independently but family has concerns about general decline and ability to perform self care activities and ability to maintain independence  Plan:  . Provide client/caregiver with benefit information re: current health plan . Education re: options for long term level of care needs  *initial goal documentation    . "I'm really concerned about his home safety and ability to self-manage due his vision loss"     Current Barriers:  . Functional limitations due to poor vision secondary to Glaucoma . Patient lives alone  Limited support/caregiver options  Nurse Case Manager Clinical Goal(s): Over the next 30  days, patient and caregiver [name], will verbalize increased knowledge and understanding related to home safety and availability of community  agencies offering assistance for the visually impaired.  Interventions:   Telephonic outreach for initial introduction of CCM services to patient/daughter [name] (per patients request) . Provided active listening to daughter and validated her feelings of concern for her fathers health and safety  Scheduled initial Face to Face encounter with patient and daughter at providers office for 04/11/18 . Plan to make appropriate referrals needed  . Reinforce the need to use community agencies if indicated . Reinforce physician's explanation of medical management of condition . Help patient and or caregiver identify and make arrangements at home to promote patients independent living and sense of safety  Patient Self Care Activities:  . Patient self-administers his medications with difficulty due to poor vision (daughter is filling his pill box)  Patient attends all scheduled MD appointments (daughter provides transportation and accompanies the patient)  *initial goal documentation    . "My father wants to ask Dr. Baird Cancer about his CHF...he was told he doesn't have this condition"     Current Barriers:   Knowledge Deficits related to CHF disease process and self health management strategies - patient does not weigh self daily and does not understand CHF disease process  Functional limitations due poor vision (see other goal)  Nurse Case Manager Clinical Goal(s): Over the next 30 days, patient and caregiver will verbalize basic understanding of CHF disease process and self health management plan as evidenced by verbalization of basic understanding of CHF diagnosis, adherence to daily weight measurement, reporting findings outside established parameters, and adherence to prescribed medication regimen.   Interventions:   Evaluate and collaborate with PCP to obtain recommended/established treatment plan related to CHF   Scheduled patient for face to face visit with CCM team on 04/11/18  (patient/daughter will come to office at approximately 2:30pm)  Plan to provide basic education re: the importance of daily weights, recording, and contacting CCM team or provider team re: weight gain of 3# overnight or 5# in a week  Patient Self Care Activities:  . Attends scheduled appointments (daughter transports and attends with patient)  *initial goal documentation     The patient verbalized understanding of instructions provided today and declined a print copy of patient instruction materials.   Telephone follow up appointment with CCM team member scheduled for: within the next 2 weeks  Barb Merino, [facility] Care Management Coordinator [place] [facility]  Direct Phone: [phone]

## 2018-04-16 NOTE — Chronic Care Management (AMB) (Signed)
Chronic Care Management   Initial Visit Note  04/16/2018 Name: [name] MRN: [id number] DOB: 11-Apr-1940  Referred by: [name], MD Reason for referral : Chronic Care Management (Initial CCM Face to Face visit)   Subjective: "We are doing pretty good now but we can use some resources"  Objective: BP Readings from Last 3 Encounters:  04/16/18 126/74  03/30/18 136/67  03/20/18 (!) 159/74   Lab Results  Component Value Date   HGBA1C 6.3 (H) 03/25/2018   HGBA1C 6.5 (H) 02/19/2018   HGBA1C 7.1 (H) 09/02/2017   Lab Results  Component Value Date   MICROALBUR 150 02/19/2018   CREATININE 6.68 (H) 03/30/2018    Last Annual Wellness Visit Date: 02/19/18 Next Scheduled Annual Wellness Visit Date: to be scheduled   # ED visits/last 6 months: 1  # IP Hospitalizations/ last 6 months:  2  Assessment: Mr. Elijah White is a 78 year old male who sees [name], MD for primary care. Dr. Baird Cancer asked the CCM team to consult the patient for assistance with chronic disease management related to Essential hypertension, type II Diabetes Mellitus with renal manifestations, acute on chronic diastolic CHF, Renal failure (ARF), Glaucoma w/vision loss of right eye.  Review of patient status, including review of consultants reports, relevant laboratory and other test results, and collaboration with appropriate care team members and the patient's provider was performed as part of comprehensive patient evaluation and provision of chronic care management services.    Initial CCM face to face visit was completed today with patient and his daughter Elijah White.   Visit Information  Goals    . "He's not taking anything for his Diabetes at this time"     Current Barriers:   Knowledge deficit related to recommendations for best treatment of Diabetes Mellitus  Functionally limited with self-monitoring CBG's due to poor vision secondary to Glaucoma  Nurse Case Manager Clinical  Goal(s): Over the next 30 days, patient/daughter will verbalize understanding of MD recommendations for Pharmacological Management and Self Health Management of Diabetes.  Interventions:   Enrolled patient in CCM program  Scheduled face to face visit with CCM nurse  Reinforced with daughter the importance of discussing Diabetes treatment plan with PCP visit scheduled for 04/11/18  Patient Self Care Activities:   Patient has limited ability to self-monitor CBG's due to visual impairment  *initial goal documentation      . "I need help getting a life alert for dad" (pt-stated)     Current Barriers:  . Limited access to caregiver- requests life alert system  Clinical Social Work Clinical Goal(s): Over the next 14 days, client will work with BSW to address concerns related to life alert system  Interventions: . Client interviewed and appropriate assessments performed, Provided client with information about health plan benefits and Collaborated with RN Case Manager re: comprehensive case management needs  Patient Self Care Activities:  . Daughter/primary caregiver is concerned about patients ability to independently call for help if needed.  Plan:  . BSW will review patient payor benefits related to life alert system . BSW will outreach the patients daughter/primary caregiver to discuss findings  *initial goal documentation     . "I need to make sure he will have transportation if he has to start dialysis" (pt-stated)     Current Barriers:  . Future transportation concerns (potential need for dialysis transportation)  Clinical Social Work Clinical Goal(s): Over the next 30 days, client will work with [employer] to address concerns  related to transportation needs  Interventions: . Assistingclient/caregiver with obtaining information about health plan benefits  Patient Self Care Activities:  . Dtr currently can provide transportation to all appointments but shares concerns re:  transportation needs should patient start dialysis  Plan:  . Collaboration with health plan, RNCM, patient/caregiver re: transportation needs . Collection of information from health plan re: transportation benefit . Follow up call to patient/caregiver scheduled  *initial goal documentation     . "I want to make sure we have the right Medicaid in place if we ever need to go to assisted living" (pt-stated)     Current Barriers:  . Level of care concerns  Clinical Social Work Clinical Goal(s): Over the next 30 days, client will work with BSW to address concerns related to level of care and associated care coordination needs  Interventions: . Client interviewed and appropriate assessments performed   . provided education to client/caregiver regarding health plan benefits  Patient Self Care Activities:  . Currently able to perform ADL's independently but family has concerns about general decline and ability to perform self care activities and ability to maintain independence  Plan:  . Provide client/caregiver with benefit information re: current health plan . Education re: options for long term level of care needs  *initial goal documentation    . "I'm really concerned about his home safety and ability to self-manage due his vision loss"     Current Barriers:  . Functional limitations due to poor vision secondary to Glaucoma . Patient lives alone  Limited support/caregiver options  Nurse Case Manager Clinical Goal(s): Over the next 30 days, patient and caregiver [name], will verbalize increased knowledge and understanding related to home safety and availability of community agencies offering assistance for the visually impaired.  Interventions:   Telephonic outreach for initial introduction of CCM services to patient/daughter Elijah White (per patients request) . Provided active listening to daughter and validated her feelings of concern for her fathers health and  safety  Scheduled initial Face to Face encounter with patient and daughter at providers office for 04/11/18 . Plan to make appropriate referrals needed  . Reinforce the need to use community agencies if indicated . Reinforce physician's explanation of medical management of condition . Help patient and or caregiver identify and make arrangements at home to promote patients independent living and sense of safety  Patient Self Care Activities:  . Patient self-administers his medications with difficulty due to poor vision (daughter is filling his pill box)  Patient attends all scheduled MD appointments (daughter provides transportation and accompanies the patient)  *initial goal documentation    . "My father wants to ask Dr. Baird Cancer about his CHF...he was told he doesn't have this condition"     Current Barriers:   Knowledge Deficits related to CHF disease process and self health management strategies - patient does not weigh self daily and does not understand CHF disease process  Functional limitations due poor vision (see other goal)  Nurse Case Manager Clinical Goal(s): Over the next 30 days, patient and caregiver will verbalize basic understanding of CHF disease process and self health management plan as evidenced by verbalization of basic understanding of CHF diagnosis, adherence to daily weight measurement, reporting findings outside established parameters, and adherence to prescribed medication regimen.   Interventions:   Evaluate and collaborate with PCP to obtain recommended/established treatment plan related to CHF   Scheduled patient for face to face visit with CCM team on 04/11/18 (patient/daughter will come to office  at approximately 2:30pm)  Plan to provide basic education re: the importance of daily weights, recording, and contacting CCM team or provider team re: weight gain of 3# overnight or 5# in a week  Patient Self Care Activities:  . Attends scheduled appointments  (daughter transports and attends with patient)  *initial goal documentation    Patient agreed to services and verbal consent obtained.   Mr. Mannor was given information about Chronic Care Management services today including:  1. CCM service includes personalized support from designated clinical staff supervised by his physician, including individualized plan of care and coordination with other care providers 2. 24/7 contact phone numbers for assistance for urgent and routine care needs. 3. Service will only be billed when office clinical staff spend 20 minutes or more in a month to coordinate care. 4. Only one practitioner may furnish and bill the service in a calendar month. 5. The patient may stop CCM services at any time (effective at the end of the month) by phone call to the office staff. 6. The patient will be responsible for cost sharing (co-pay) of up to 20% of the service fee (after annual deductible is met).  Telephone follow up appointment with CCM team member scheduled for: within the next 2 weeks   Barb Merino, [facility] Care Management Coordinator [place] [facility]  Direct Phone: [phone]

## 2018-04-16 NOTE — Patient Instructions (Signed)
Licensed Clinical Social Worker Visit Information  Goals we discussed today:  Goals Addressed            This Visit's Progress   . "I need help getting a life alert for dad" (pt-stated)       Current Barriers:  . Limited access to caregiver- requests life alert system  Clinical Social Work Clinical Goal(s): Over the next 14 days, client will work with BSW to address concerns related to life alert system  Interventions: . Client interviewed and appropriate assessments performed, Provided client with information about health plan benefits and Collaborated with RN Case Manager re: comprehensive case management needs  Patient Self Care Activities:  . Daughter/primary caregiver is concerned about patients ability to independently call for help if needed.  Plan:  . BSW will review patient payor benefits related to life alert system . BSW will outreach the patients daughter/primary caregiver to discuss findings  *initial goal documentation     . "I need to make sure he will have transportation if he has to start dialysis" (pt-stated)       Current Barriers:  . Future transportation concerns (potential need for dialysis transportation)  Clinical Social Work Clinical Goal(s): Over the next 30 days, client will work with [employer] to address concerns related to transportation needs  Interventions: . Assistingclient/caregiver with obtaining information about health plan benefits  Patient Self Care Activities:  . Dtr currently can provide transportation to all appointments but shares concerns re: transportation needs should patient start dialysis  Plan:  . Collaboration with health plan, RNCM, patient/caregiver re: transportation needs . Collection of information from health plan re: transportation benefit . Follow up call to patient/caregiver scheduled  *initial goal documentation     . "I want to make sure we have the right Medicaid in place if we ever need to go to assisted  living" (pt-stated)       Current Barriers:  . Level of care concerns  Clinical Social Work Clinical Goal(s): Over the next 30 days, client will work with BSW to address concerns related to level of care and associated care coordination needs  Interventions: . Client interviewed and appropriate assessments performed   . provided education to client/caregiver regarding health plan benefits  Patient Self Care Activities:  . Currently able to perform ADL's independently but family has concerns about general decline and ability to perform self care activities and ability to maintain independence  Plan:  . Provide client/caregiver with benefit information re: current health plan . Education re: options for long term level of care needs  *initial goal documentation        Materials provided: No  Mr. Ragsdale was given information about Chronic Care Management services today including:  1. CCM service includes personalized support from designated clinical staff supervised by his physician, including individualized plan of care and coordination with other care providers 2. 24/7 contact phone numbers for assistance for urgent and routine care needs. 3. Service will only be billed when office clinical staff spend 20 minutes or more in a month to coordinate care. 4. Only one practitioner may furnish and bill the service in a calendar month. 5. The patient may stop CCM services at any time (effective at the end of the month) by phone call to the office staff. 6. The patient will be responsible for cost sharing (co-pay) of up to 20% of the service fee (after annual deductible is met).  Patient agreed to services and verbal consent obtained.  Print copy of patient instructions provided.   Follow up plan: Appointment scheduled for SW follow up with client by phone over the next 2 weeks.    [name], BSW, CDP [facility] Care Management Social Worker [phone]

## 2018-04-23 ENCOUNTER — Other Ambulatory Visit: Payer: Self-pay

## 2018-04-23 MED ORDER — ISOSORBIDE MONONITRATE ER 30 MG PO TB24: 30.0000 mg | ORAL_TABLET | Freq: Every day | ORAL | 1 refills | Status: DC

## 2018-04-23 MED ORDER — FUROSEMIDE 80 MG PO TABS: ORAL_TABLET | ORAL | 1 refills | Status: DC

## 2018-04-23 MED ORDER — CARVEDILOL 3.125 MG PO TABS: 3.1250 mg | ORAL_TABLET | Freq: Two times a day (BID) | ORAL | 1 refills | Status: DC

## 2018-04-25 ENCOUNTER — Encounter: Payer: Medicare Other | Admitting: Vascular Surgery

## 2018-04-25 ENCOUNTER — Other Ambulatory Visit: Payer: Self-pay

## 2018-04-25 ENCOUNTER — Encounter (HOSPITAL_COMMUNITY): Payer: Medicare Other

## 2018-04-28 ENCOUNTER — Ambulatory Visit: Payer: Self-pay

## 2018-04-28 ENCOUNTER — Telehealth: Payer: Self-pay

## 2018-04-28 ENCOUNTER — Other Ambulatory Visit: Payer: Self-pay

## 2018-04-28 DIAGNOSIS — H5461 Unqualified visual loss, right eye, normal vision left eye: Secondary | ICD-10-CM

## 2018-04-28 DIAGNOSIS — N185 Chronic kidney disease, stage 5: Secondary | ICD-10-CM

## 2018-04-28 DIAGNOSIS — E1122 Type 2 diabetes mellitus with diabetic chronic kidney disease: Secondary | ICD-10-CM

## 2018-04-28 DIAGNOSIS — I5032 Chronic diastolic (congestive) heart failure: Secondary | ICD-10-CM

## 2018-04-28 DIAGNOSIS — N183 Chronic kidney disease, stage 3 (moderate): Principal | ICD-10-CM

## 2018-04-28 DIAGNOSIS — N179 Acute kidney failure, unspecified: Secondary | ICD-10-CM

## 2018-04-28 MED ORDER — CARVEDILOL 3.125 MG PO TABS: 3.1250 mg | ORAL_TABLET | Freq: Two times a day (BID) | ORAL | 1 refills | Status: DC

## 2018-04-28 MED ORDER — ISOSORBIDE MONONITRATE ER 30 MG PO TB24: 30.0000 mg | ORAL_TABLET | Freq: Every day | ORAL | 1 refills | Status: DC

## 2018-04-28 MED ORDER — FUROSEMIDE 80 MG PO TABS: ORAL_TABLET | ORAL | 1 refills | Status: DC

## 2018-04-28 NOTE — Patient Instructions (Addendum)
Social Worker Visit Information  Goals we discussed today:  Goals Addressed      Patient Stated   . "I need help getting a life alert for dad" (pt-stated)       Current Barriers:  . Limited access to caregiver- requests life alert system  Clinical Social Work Clinical Goal(s): Over the next 30 days, patient will obtain a life alert system with the help of his daughter/primary caregiver  Interventions: . Collaborated with [place] program re: life alert options - provided daughter with pricing options . [place] Medicare plan to inquire if patients plan covered life alert program - not covered  Patient Self Care Activities:  . Daughter/primary caregiver is concerned about patients ability to independently call for help if needed.  Plan:  . Patients daughter/ primary caregiver reports she will persue patient life alert via an alternate source due to cost . SW to follow up with primary caregiver in the next two weeks to assess patient progress in obtaining life alert pendant.  Please see past updates related to this goal by clicking on the "Past Updates" button in the selected goal      . "I need to make sure he will have transportation if he has to start dialysis" (pt-stated)       Current Barriers:  . Future transportation concerns (potential need for dialysis transportation)  Clinical Social Work Clinical Goal(s): Over the next 30 days, SW will follow up with the patients daughter/primary caregiver to discuss outcome of patients projected dialysis needs.  Interventions: . SW attempted to educated patients daughter/caregiver on health plan transportation benefits. At this time daughter is not interested in receiving information due to possibility the patient may not need dialysis  Patient Self Care Activities:  . Dtr currently can provide transportation to all appointments but shares concerns re: transportation needs should patient start  dialysis  Plan:  . SW to follow up with the patient daughter/caregiver in the next 30 to assess transportation needs  Please see past updates related to this goal by clicking on the "Past Updates" button in the selected goal      . COMPLETED: "I want to make sure we have the right Medicaid in place if we ever need to go to assisted living" (pt-stated)       Current Barriers:  . Level of care concerns  Clinical Social Work Clinical Goal(s): Over the next 30 days, client will work with BSW to address concerns related to level of care and associated care coordination needs  Interventions: . Pt receiving services from [employer] ([employer] to assist with independence in the home related to vision . Pt daughter/caregiver reports patient to remain in the home at this time  Patient Self Care Activities:  . Currently able to perform ADL's independently but family has concerns about general decline and ability to perform self care activities and ability to maintain independence  Plan:  . SW to mark this goal as complete as education has been provided. Patient to remain in the home at this time.  Please see past updates related to this goal by clicking on the "Past Updates" button in the selected goal         Materials Provided: No  Follow Up Plan: Appointment scheduled for SW follow up with client by phone within the next 30 days.  [name], BSW, CDP [facility] Care Management Social Worker [phone]

## 2018-04-28 NOTE — Chronic Care Management (AMB) (Signed)
Chronic Care Management    Clinical Social Work Follow Up Note  04/28/2018 Name: Elijah White MRN: [id number] DOB: 1940/11/13   Referred by: PCP, Dr. Baird Cancer for Care Coordination .   Review of patient status, including review of consultants reports, other relevant assessments, and collaboration with appropriate care team members and the patient's provider was performed as part of comprehensive patient evaluation and provision of chronic care management services.    Last CCM Appointment: February 12 with CCM team including SW and RN CM.   Goals Addressed      Patient Stated   . "I need help getting a life alert for dad" (pt-stated)       Current Barriers:  . Limited access to caregiver- requests life alert system  Clinical Social Work Clinical Goal(s): Over the next 30 days, patient will obtain a life alert system with the help of his daughter/primary caregiver  Interventions: . Collaborated with [place] program re: life alert options - provided daughter with pricing options . [place] Medicare plan to inquire if patients plan covered life alert program - not covered  Patient Self Care Activities:  . Daughter/primary caregiver is concerned about patients ability to independently call for help if needed.  Plan:  . Patients daughter/ primary caregiver reports she will persue patient life alert via an alternate source due to cost . SW to follow up with primary caregiver in the next two weeks to assess patient progress in obtaining life alert pendant.  Please see past updates related to this goal by clicking on the "Past Updates" button in the selected goal     . "I need to make sure he will have transportation if he has to start dialysis" (pt-stated)       Current Barriers:  . Future transportation concerns (potential need for dialysis transportation)  Clinical Social Work Clinical Goal(s): Over the next 30 days, SW will follow up with  the patients daughter/primary caregiver to discuss outcome of patients projected dialysis needs.  Interventions: . SW attempted to educated patients daughter/caregiver on health plan transportation benefits. At this time daughter is not interested in receiving information due to possibility the patient may not need dialysis  Patient Self Care Activities:  . Dtr currently can provide transportation to all appointments but shares concerns re: transportation needs should patient start dialysis  Plan:  . SW to follow up with the patient daughter/caregiver in the next 30 to assess transportation needs  Please see past updates related to this goal by clicking on the "Past Updates" button in the selected goal      . COMPLETED: "I want to make sure we have the right Medicaid in place if we ever need to go to assisted living" (pt-stated)       Current Barriers:  . Level of care concerns  Clinical Social Work Clinical Goal(s): Over the next 30 days, client will work with BSW to address concerns related to level of care and associated care coordination needs  Interventions: . Pt receiving services from [employer] ([employer]) to assist with independence in the home related to vision . Pt daughter/caregiver reports patient to remain in the home at this time  Patient Self Care Activities:  . Currently able to perform ADL's independently but family has concerns about general decline and ability to perform self care activities and ability to maintain independence  Plan:  . SW to mark this goal as complete as education has  been provided. Patient to remain in the home at this time.  Please see past updates related to this goal by clicking on the "Past Updates" button in the selected goal       Follow Up Plan: Appointment scheduled for SW follow up with client by phone within the next 30 days.   [name], BSW, CDP [facility] Care Management Social Worker [phone]  Total  time spent performing care coordination and/or care management activities with the patient by phone or face to face = 30 minutes.

## 2018-04-30 NOTE — Progress Notes (Signed)
Subjective:     Patient ID: Elijah White , male    DOB: [birth year] , 78 y.o.   MRN: [id number]   Chief Complaint  Patient presents with  . Hospitalization Follow-up    HPI  Admit date: 03/25/2018  Discharge date: 03/30/2018   [name], accompanied by his daughter,with history of CKD 5 progressing towards ESRD, chronic diastolic CHF last known EF around 55% on recent echocardiogram, essential hypertension, DM type II, who lives at home by himself ambulates with the help of a walker who presented to hospital on 1/21 with c/o progressive 1 week history of shortness of breath and orthopnea. He denies any other medical problems, specifically no fever chills, no productive cough, no chest pain or abdominal pain. He has also noticed gradual swelling in his arms and legs and multiple pounds of weight gain over the last several weeks.  He was found to have acute on chronic diastolic CHF, ARF on CKD 5. He was discharged on 1/26 in stable condition. He denies SOB.      Past Medical History:  Diagnosis Date  . Chronic kidney disease   . Diabetes mellitus without complication ([place])   . Elevated cholesterol with high triglycerides   . Hypertension      Family History  Problem Relation Age of Onset  . CAD Mother      Current Outpatient Medications:  .  acetaminophen (TYLENOL) 500 MG tablet, Take 1,000 mg by mouth every 6 (six) hours as needed for headache (pain)., Disp: , Rfl:  .  atorvastatin (LIPITOR) 10 MG tablet, TAKE 1 TABLET BY MOUTH ONCE DAILY (Patient taking differently: Take 10 mg by mouth daily at 6 PM. ), Disp: 90 tablet, Rfl: 0 .  bimatoprost (LUMIGAN) 0.01 % SOLN, Place 1 drop into the right eye at bedtime., Disp: , Rfl:  .  blood glucose meter kit and supplies KIT, Dispense based on patient and insurance preference. Use up to four times daily as directed. (FOR ICD-9 250.00, 250.01). For QAC - HS accuchecks., Disp: 1 each, Rfl: 1 .  brimonidine  (ALPHAGAN) 0.15 % ophthalmic solution, Place 1 drop into the right eye 3 (three) times daily. , Disp: , Rfl: 11 .  calcitRIOL (ROCALTROL) 0.5 MCG capsule, Take 0.5 mcg by mouth daily., Disp: , Rfl:  .  dorzolamide-timolol (COSOPT) 22.3-6.8 MG/ML ophthalmic solution, Place 1 drop into the right eye 2 (two) times daily., Disp: , Rfl: 11 .  esomeprazole (NEXIUM) 40 MG capsule, TAKE 1 CAPSULE BY MOUTH ONCE DAILY (Patient taking differently: Take 40 mg by mouth at bedtime. ), Disp: 30 capsule, Rfl: 0 .  gabapentin (NEURONTIN) 100 MG capsule, TAKE ONE CAPSULE BY MOUTH AT BEDTIME (Patient taking differently: Take 100 mg by mouth at bedtime. ), Disp: 30 capsule, Rfl: 3 .  hydrALAZINE (APRESOLINE) 25 MG tablet, TAKE 1 TABLET(25 MG) BY MOUTH TWICE DAILY (Patient taking differently: Take 25 mg by mouth 2 (two) times daily. ), Disp: 180 tablet, Rfl: 1 .  pilocarpine (PILOCAR) 4 % ophthalmic solution, Place 1 drop into the right eye 3 (three) times daily., Disp: , Rfl:  .  carvedilol (COREG) 3.125 MG tablet, Take 1 tablet (3.125 mg total) by mouth 2 (two) times daily with a meal., Disp: 180 tablet, Rfl: 1 .  furosemide (LASIX) 80 MG tablet, Take 1 pill '80mg'$  in am and 1/2 pill '40mg'$  in the pm., Disp: 90 tablet, Rfl: 1 .  isosorbide mononitrate (IMDUR) 30 MG 24 hr tablet, Take 1  tablet (30 mg total) by mouth daily for 30 days., Disp: 90 tablet, Rfl: 1   No Known Allergies   Review of Systems  Constitutional: Negative.   Respiratory: Negative.   Cardiovascular: Negative.   Gastrointestinal: Negative.   Neurological: Negative.   Psychiatric/Behavioral: Negative.      Today's Vitals   04/16/18 1121  BP: 126/74  Pulse: 64  Temp: 97.7 F (36.5 C)  TempSrc: Oral  Weight: 191 lb 9.6 oz (86.9 kg)  Height: '5\' 3"'$  (1.6 m)   Body mass index is 33.94 kg/m.   Objective:  Physical Exam Vitals signs and nursing note reviewed.  Constitutional:      Appearance: Normal appearance.  Cardiovascular:     Rate and  Rhythm: Normal rate and regular rhythm.     Heart sounds: Normal heart sounds.     Comments: B/l LE edema Pulmonary:     Effort: Pulmonary effort is normal.     Breath sounds: Normal breath sounds.  Skin:    General: Skin is warm.  Neurological:     General: No focal deficit present.     Mental Status: He is alert.  Psychiatric:        Mood and Affect: Mood normal.         Assessment And Plan:     1. 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 Most recent hba1c was 6.07 Mar 2018.  He and daughter encouraged to monitor for hypoglycemia.   2. 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 ECHO results reviewed. He will continue with current meds. He is encouraged to limit his salt intake. He has also been introduced to CCM Nurse today.   3. Chronic diastolic CHF (congestive heart failure) (H[facility])  Chronic, yet stable.   4. Class 1 obesity due to excess calories with serious comorbidity and body mass index (BMI) of 33.0 to 33.9 in adult  He is encouraged to initially strive for BMI less than 30 to decrease cardiac risk. He is encouraged to avoid sugary beverages and processed foods.    Maximino Greenland, MD

## 2018-05-02 ENCOUNTER — Encounter: Payer: Medicare Other | Admitting: Vascular Surgery

## 2018-05-02 ENCOUNTER — Encounter (HOSPITAL_COMMUNITY): Payer: Medicare Other

## 2018-05-07 ENCOUNTER — Other Ambulatory Visit: Payer: Self-pay | Admitting: Internal Medicine

## 2018-05-09 ENCOUNTER — Ambulatory Visit (HOSPITAL_COMMUNITY)
Admission: RE | Admit: 2018-05-09 | Discharge: 2018-05-09 | Disposition: A | Discharge status: Home / Self Care | Payer: Medicare Other | Source: Ambulatory Visit | Attending: Vascular Surgery | Admitting: Vascular Surgery

## 2018-05-09 ENCOUNTER — Ambulatory Visit (INDEPENDENT_AMBULATORY_CARE_PROVIDER_SITE_OTHER): Payer: Medicare Other | Admitting: Vascular Surgery

## 2018-05-09 ENCOUNTER — Other Ambulatory Visit: Payer: Self-pay

## 2018-05-09 ENCOUNTER — Encounter: Payer: Self-pay | Admitting: Vascular Surgery

## 2018-05-09 VITALS — BP 142/67 | HR 60 | Temp 98.1°F | Resp 18 | Ht 64.0 in | Wt 183.2 lb

## 2018-05-09 DIAGNOSIS — N185 Chronic kidney disease, stage 5: Secondary | ICD-10-CM | POA: Diagnosis present

## 2018-05-09 NOTE — Progress Notes (Signed)
Patient ID: Elijah White, male   DOB: [birth year], 78 y.o.   MRN: [id number]  Reason for Consult: Post-op Follow-up   Referred by [name], MD  Subjective:     HPI:  Elijah White is a 78 y.o. male with chronic kidney disease status post left first stage basilic vein fistula.  Hand is doing well.  Has minimal left upper extremity swelling.  Wound is healed well.  Past Medical History:  Diagnosis Date  . Chronic kidney disease   . Diabetes mellitus without complication ([place])   . Elevated cholesterol with high triglycerides   . Hypertension    Family History  Problem Relation Age of Onset  . CAD Mother    Past Surgical History:  Procedure Laterality Date  . AV FISTULA PLACEMENT Left 03/20/2018   Procedure: ARTERIOVENOUS (AV) FISTULA CREATION ARM;  Surgeon: [name], MD;  Location: [facility];  Service: Vascular;  Laterality: Left;  . GLAUCOMA SURGERY  2019   multiple surgeries  . PROSTATE SURGERY      Short Social History:  Social History   Tobacco Use  . Smoking status: Former Smoker    Packs/day: 0.50    Last attempt to quit: 03/23/2018    Years since quitting: 0.1  . Smokeless tobacco: Never Used  Substance Use Topics  . Alcohol use: No    No Known Allergies  Current Outpatient Medications  Medication Sig Dispense Refill  . acetaminophen (TYLENOL) 500 MG tablet Take 1,000 mg by mouth every 6 (six) hours as needed for headache (pain).    [name] atorvastatin (LIPITOR) 10 MG tablet TAKE 1 TABLET BY MOUTH EVERY DAY 90 tablet 0  . BD PEN NEEDLE NANO U/F 32G X 4 MM MISC USE SUBCUTANEOUS FOUR TIMES A DAY    . bimatoprost (LUMIGAN) 0.01 % SOLN Place 1 drop into the right eye at bedtime.    . blood glucose meter kit and supplies KIT Dispense based on patient and insurance preference. Use up to four times daily as directed. (FOR ICD-9 250.00, 250.01). For QAC - HS accuchecks. 1 each 1  . brimonidine (ALPHAGAN) 0.15 % ophthalmic solution Place 1 drop into  the right eye 3 (three) times daily.   11  . calcitRIOL (ROCALTROL) 0.5 MCG capsule Take 0.5 mcg by mouth daily.    . carvedilol (COREG) 3.125 MG tablet Take 1 tablet (3.125 mg total) by mouth 2 (two) times daily with a meal. 180 tablet 1  . dorzolamide-timolol (COSOPT) 22.3-6.8 MG/ML ophthalmic solution Place 1 drop into the right eye 2 (two) times daily.  11  . esomeprazole (NEXIUM) 40 MG capsule TAKE 1 CAPSULE BY MOUTH ONCE DAILY (Patient taking differently: Take 40 mg by mouth at bedtime. ) 30 capsule 0  . furosemide (LASIX) 80 MG tablet Take 1 pill '80mg'$  in am and 1/2 pill '40mg'$  in the pm. 90 tablet 1  . gabapentin (NEURONTIN) 100 MG capsule TAKE ONE CAPSULE BY MOUTH AT BEDTIME (Patient taking differently: Take 100 mg by mouth at bedtime. ) 30 capsule 3  . hydrALAZINE (APRESOLINE) 25 MG tablet TAKE 1 TABLET(25 MG) BY MOUTH TWICE DAILY (Patient taking differently: Take 25 mg by mouth 2 (two) times daily. ) 180 tablet 1  . isosorbide mononitrate (IMDUR) 30 MG 24 hr tablet Take 1 tablet (30 mg total) by mouth daily for 30 days. 90 tablet 1  . Lancets (ONETOUCH DELICA PLUS [id number]) MISC USE AS DIRECTED UP TO FOUR TIMES A DAY    .  LANTUS SOLOSTAR 100 UNIT/ML Solostar Pen     . pilocarpine (PILOCAR) 4 % ophthalmic solution Place 1 drop into the right eye 3 (three) times daily.     No current facility-administered medications for this visit.     REVIEW OF SYSTEMS  No complaints related to today's visit.    Objective:  Objective   Vitals:   05/09/18 1129  BP: (!) 142/67  Pulse: 60  Resp: 18  Temp: 98.1 F (36.7 C)  SpO2: 98%  Weight: 183 lb 3.2 oz (83.1 kg)  Height: '5\' 4"'$  (1.626 m)   Body mass index is 31.45 kg/m.  Physical Exam Awake alert oriented Nonlabored respirations Very strong left upper extremity thrill Palpable left radial pulse   Data: Apparently interpreted his dialysis duplex which demonstrates flow volume 1012 mL/min with diameter up to 0.88 cm.  It is 1.1 cm  deep in the arm.     Assessment/Plan:    78 year old male status post for stage left basilic vein fistula now maturing well.  Was rehospitalized with fluid overload not on dialysis yet.  Plan for left second stage basilic vein fistula in the near future.  I discussed risk benefits alternatives with he and his daughter and they agree to proceed.     [name] MD [facility]

## 2018-05-12 ENCOUNTER — Other Ambulatory Visit: Payer: Self-pay | Admitting: *Deleted

## 2018-05-19 ENCOUNTER — Other Ambulatory Visit: Payer: Self-pay

## 2018-05-19 ENCOUNTER — Ambulatory Visit: Payer: Self-pay

## 2018-05-19 DIAGNOSIS — H5461 Unqualified visual loss, right eye, normal vision left eye: Secondary | ICD-10-CM

## 2018-05-19 DIAGNOSIS — E1121 Type 2 diabetes mellitus with diabetic nephropathy: Secondary | ICD-10-CM

## 2018-05-19 DIAGNOSIS — I5032 Chronic diastolic (congestive) heart failure: Secondary | ICD-10-CM

## 2018-05-19 DIAGNOSIS — N185 Chronic kidney disease, stage 5: Secondary | ICD-10-CM

## 2018-05-19 DIAGNOSIS — N179 Acute kidney failure, unspecified: Secondary | ICD-10-CM

## 2018-05-19 DIAGNOSIS — N183 Chronic kidney disease, stage 3 (moderate): Principal | ICD-10-CM

## 2018-05-19 DIAGNOSIS — E1122 Type 2 diabetes mellitus with diabetic chronic kidney disease: Secondary | ICD-10-CM

## 2018-05-19 NOTE — Patient Instructions (Signed)
Social Worker Visit Information  Goals we discussed today:  Goals Addressed            This Visit's Progress     Patient Stated   . "I need help getting a life alert for dad" (pt-stated)   Not on track    Current Barriers:  . Limited access to caregiver- requests life alert system . Patients daughter is primary caregiver and having difficultly balancing between patient needs and her own. Has not been able to look into other systems as previously mentioned . 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 Collaborated with [place] program re: life alert options - provided daughter with pricing options . [place] Medicare plan to inquire if patients plan covered life alert program - not covered . Encouraged the patients daughter/primary caregiver to delegate tasks to other family members to assist with patient care. [name] Re-educated the patients daughter on the Med Alert system provided under [facility] program . Provided information to the patients daughter/primary caregiver via e-mail regarding Med Alert program as requested  Patient Self Care Activities:  . Daughter/primary caregiver is concerned about patients ability to independently call for help if needed.  Plan:  . SW to follow up with the patients daughter/primary caregiver in the next month to assess status of current goal  Please see past updates related to this goal by clicking on the "Past Updates" button in the selected goal          Materials Provided: Yes: Med Alert Flyer  Follow Up Plan: SW will follow up with patient by phone over the next month   [name], BSW, CDP [facility] / [place] Management Social Worker [phone]

## 2018-05-19 NOTE — Chronic Care Management (AMB) (Signed)
Chronic Care Management    Clinical Social Work Follow Up Note  05/19/2018 Name: Elijah White MRN: [id number] DOB: Aug 09, 1940  Elijah White is a 78 y.o. year old male who is a primary care patient of [name], MD. The CCM team was consulted for assistance with chronic disease management and care coordination needs.  Review of patient status, including review of consultants reports, other relevant assessments, and collaboration with appropriate care team members and the patient's provider was performed as part of comprehensive patient evaluation and provision of chronic care management services.    I reached out to the patients daughter and primary caregiver, Elijah White,  by phone to assess the patients progress in obtaining a life alert. Mrs. Elijah White reports she has not had time to follow up with alternate options due to her personal schedule. It is reported the patient continues to engage with [employer] and received an in home visit "last week". Mrs. Elijah White reports she is still planning to keep the patient in his home as long as it is safe. She is concerned that his vision is getting worse which may lead to placement in the future. The patient continues to self-administer medications. Unfortunately, it is reported the patient continues to miss the small pill "occasionally" due to an inability to see it. Mrs. Elijah White states "If I keep finding it left, we will have to look at placement". SW encouraged Mrs. Foster to remain engaged with CCM team as well as [employer] as both these programs are focused on assisting the patient to meet his goal of maintaining in his home as long as he can safely do so. Mrs. Elijah White stated understanding.   Goals Addressed            This Visit's Progress     Patient Stated   . "I need help getting a life alert for dad" (pt-stated)   Not on track    Current Barriers:  . Limited access to caregiver- requests life alert system .  Patients daughter is primary caregiver and having difficultly balancing between patient needs and her own. Has not been able to look into other systems as previously mentioned . 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 Collaborated with [place] program re: life alert options - provided daughter with pricing options . [place] Medicare plan to inquire if patients plan covered life alert program - not covered . Encouraged the patients daughter/primary caregiver to delegate tasks to other family members to assist with patient care. [name] Re-educated the patients daughter on the Med Alert system provided under [facility] program . Provided information to the patients daughter/primary caregiver via e-mail regarding Med Alert program as requested  Patient Self Care Activities:  . Daughter/primary caregiver is concerned about patients ability to independently call for help if needed.  Plan:  . SW to follow up with the patients daughter/primary caregiver in the next month to assess status of current goal  Please see past updates related to this goal by clicking on the "Past Updates" button in the selected goal          Follow Up Plan: SW to follow up with the patient and his daughter over the next month.   [name], BSW, CDP [facility] /  [place] Management Social Worker [phone]  Total time spent performing care coordination and/or care management activities with the patient by phone or face to face = 25 minutes.

## 2018-05-20 ENCOUNTER — Encounter (HOSPITAL_COMMUNITY): Payer: Self-pay | Admitting: *Deleted

## 2018-05-20 ENCOUNTER — Other Ambulatory Visit: Payer: Self-pay

## 2018-05-20 NOTE — Anesthesia Preprocedure Evaluation (Addendum)
Anesthesia Evaluation  Patient identified by MRN, date of birth, ID band Patient awake    Reviewed: Allergy & Precautions, H&P , NPO status , Patient's Chart, lab work & pertinent test results, reviewed documented beta blocker date and time   Airway Mallampati: II  TM Distance: >3 FB Neck ROM: Full    Dental no notable dental hx. (+) Partial Lower, Partial Upper, Dental Advisory Given   Pulmonary neg pulmonary ROS, former smoker,    Pulmonary exam normal breath sounds clear to auscultation       Cardiovascular Exercise Tolerance: Good hypertension, Pt. on medications and Pt. on home beta blockers  Rhythm:Regular Rate:Normal     Neuro/Psych negative neurological ROS  negative psych ROS   GI/Hepatic Neg liver ROS, GERD  Medicated and Controlled,  Endo/Other  diabetes, Insulin Dependent  Renal/GU CRFRenal disease  negative genitourinary   Musculoskeletal  (+) Arthritis , Osteoarthritis,    Abdominal   Peds  Hematology  (+) Blood dyscrasia, anemia ,   Anesthesia Other Findings   Reproductive/Obstetrics negative OB ROS                            Anesthesia Physical Anesthesia Plan  ASA: III  Anesthesia Plan: MAC   Post-op Pain Management:    Induction: Intravenous  PONV Risk Score and Plan: 2 and Propofol infusion and Ondansetron  Airway Management Planned: Simple Face Mask  Additional Equipment:   Intra-op Plan:   Post-operative Plan:   Informed Consent: I have reviewed the patients History and Physical, chart, labs and discussed the procedure including the risks, benefits and alternatives for the proposed anesthesia with the patient or authorized representative who has indicated his/her understanding and acceptance.     Dental advisory given  Plan Discussed with: CRNA  Anesthesia Plan Comments:         Anesthesia Quick Evaluation

## 2018-05-20 NOTE — Progress Notes (Signed)
Spoke with pt's daughter, [name] for pre-op call. DPR on file. She states pt does not have a cardiac history, she states that one doctor has mentioned CHF and the kidney specialist says he doesn't have it. She states he does not have sob or chest pain. Pt is a type 2 diabetic. Pt is unable to check his blood sugar because of his sight. Daughter checks it at least 3 times a week. She states that it usually runs between 110-135. Last A1C was 6.3 on 03/25/18. When pt was admitted in January he was given a prescription for Lantus Insulin. 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 She states Dr. [name] is aware and is ok with him not using the Lantus. Instructed [name] to check pt's blood sugar in the AM when he gets up. If blood sugar is 70 or below, treat with 1/2 cup of clear juice (apple or cranberry) and recheck blood sugar 15 minutes after drinking juice. [name] voiced understanding.

## 2018-05-21 ENCOUNTER — Encounter (HOSPITAL_COMMUNITY): Payer: Self-pay

## 2018-05-21 ENCOUNTER — Ambulatory Visit: Payer: Medicare Other | Admitting: Internal Medicine

## 2018-05-21 ENCOUNTER — Ambulatory Visit (HOSPITAL_COMMUNITY)
Admission: RE | Admit: 2018-05-21 | Discharge: 2018-05-21 | Disposition: A | Discharge status: Home / Self Care | Payer: Medicare Other | Attending: Vascular Surgery | Admitting: Vascular Surgery

## 2018-05-21 ENCOUNTER — Ambulatory Visit (HOSPITAL_COMMUNITY): Payer: Medicare Other | Admitting: Anesthesiology

## 2018-05-21 ENCOUNTER — Encounter (HOSPITAL_COMMUNITY)
Admission: RE | Disposition: A | Discharge status: Home / Self Care | Payer: Self-pay | Source: Home / Self Care | Attending: Vascular Surgery

## 2018-05-21 ENCOUNTER — Other Ambulatory Visit: Payer: Self-pay

## 2018-05-21 DIAGNOSIS — K219 Gastro-esophageal reflux disease without esophagitis: Secondary | ICD-10-CM | POA: Insufficient documentation

## 2018-05-21 DIAGNOSIS — M1990 Unspecified osteoarthritis, unspecified site: Secondary | ICD-10-CM | POA: Diagnosis not present

## 2018-05-21 DIAGNOSIS — E1122 Type 2 diabetes mellitus with diabetic chronic kidney disease: Secondary | ICD-10-CM | POA: Diagnosis not present

## 2018-05-21 DIAGNOSIS — I129 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 kidney disease, or unspecified chronic kidney disease: Secondary | ICD-10-CM | POA: Diagnosis not present

## 2018-05-21 DIAGNOSIS — N189 Chronic kidney disease, unspecified: Secondary | ICD-10-CM | POA: Insufficient documentation

## 2018-05-21 DIAGNOSIS — N182 Chronic kidney disease, stage 2 (mild): Secondary | ICD-10-CM | POA: Diagnosis present

## 2018-05-21 DIAGNOSIS — Z87891 Personal history of nicotine dependence: Secondary | ICD-10-CM | POA: Insufficient documentation

## 2018-05-21 DIAGNOSIS — Z79899 Other long term (current) drug therapy: Secondary | ICD-10-CM | POA: Diagnosis not present

## 2018-05-21 DIAGNOSIS — N185 Chronic kidney disease, stage 5: Secondary | ICD-10-CM | POA: Diagnosis not present

## 2018-05-21 HISTORY — DX: Anemia, unspecified: D64.9

## 2018-05-21 HISTORY — PX: BASCILIC VEIN TRANSPOSITION: SHX5742

## 2018-05-21 HISTORY — DX: Gastro-esophageal reflux disease without esophagitis: K21.9

## 2018-05-21 HISTORY — DX: Legal blindness, as defined in USA: H54.8

## 2018-05-21 HISTORY — DX: Unspecified osteoarthritis, unspecified site: M19.90

## 2018-05-21 HISTORY — DX: Malignant (primary) neoplasm, unspecified: C80.1

## 2018-05-21 LAB — GLUCOSE, CAPILLARY
GLUCOSE-CAPILLARY: 96 mg/dL (ref 70–99)
GLUCOSE-CAPILLARY: 115 mg/dL — AB (ref 70–99)

## 2018-05-21 LAB — POCT I-STAT 4, (NA,K, GLUC, HGB,HCT)
Glucose, Bld: 110 mg/dL — ABNORMAL HIGH (ref 70–99)
HCT: 30 % — ABNORMAL LOW (ref 39.0–52.0)
Hemoglobin: 10.2 g/dL — ABNORMAL LOW (ref 13.0–17.0)
Potassium: 3.6 mmol/L (ref 3.5–5.1)
Sodium: 140 mmol/L (ref 135–145)

## 2018-05-21 SURGERY — TRANSPOSITION, VEIN, BASILIC
Anesthesia: Monitor Anesthesia Care | Site: Arm Upper | Laterality: Left

## 2018-05-21 MED ORDER — PROPOFOL 10 MG/ML IV BOLUS
INTRAVENOUS | Status: AC
  Filled 2018-05-21: qty 20

## 2018-05-21 MED ORDER — FENTANYL CITRATE (PF) 100 MCG/2ML IJ SOLN
INTRAMUSCULAR | Status: DC | PRN
  Administered 2018-05-21 (×2): 25 ug via INTRAVENOUS
  Administered 2018-05-21: 50 ug via INTRAVENOUS

## 2018-05-21 MED ORDER — 0.9 % SODIUM CHLORIDE (POUR BTL) OPTIME
TOPICAL | Status: DC | PRN
  Administered 2018-05-21: 1000 mL

## 2018-05-21 MED ORDER — CEFAZOLIN SODIUM-DEXTROSE 2-4 GM/100ML-% IV SOLN
2.0000 g | INTRAVENOUS | Status: AC
  Administered 2018-05-21: 2 g via INTRAVENOUS
  Filled 2018-05-21: qty 100

## 2018-05-21 MED ORDER — ACETAMINOPHEN 500 MG PO TABS
1000.0000 mg | ORAL_TABLET | Freq: Once | ORAL | Status: AC
  Administered 2018-05-21: 1000 mg via ORAL
  Filled 2018-05-21: qty 2

## 2018-05-21 MED ORDER — SODIUM CHLORIDE 0.9 % IV SOLN
INTRAVENOUS | Status: AC
  Filled 2018-05-21: qty 1.2

## 2018-05-21 MED ORDER — SODIUM CHLORIDE 0.9 % IV SOLN
INTRAVENOUS | Status: DC | PRN
  Administered 2018-05-21: 09:00:00

## 2018-05-21 MED ORDER — OXYCODONE-ACETAMINOPHEN 5-325 MG PO TABS: 1.0000 | ORAL_TABLET | ORAL | 0 refills | Status: DC | PRN

## 2018-05-21 MED ORDER — ONDANSETRON HCL 4 MG/2ML IJ SOLN
INTRAMUSCULAR | Status: AC
  Filled 2018-05-21: qty 2

## 2018-05-21 MED ORDER — SODIUM CHLORIDE 0.9 % IV SOLN
INTRAVENOUS | Status: DC | PRN
  Administered 2018-05-21: 25 ug/min via INTRAVENOUS

## 2018-05-21 MED ORDER — PROPOFOL 500 MG/50ML IV EMUL
INTRAVENOUS | Status: DC | PRN
  Administered 2018-05-21: 100 ug/kg/min via INTRAVENOUS

## 2018-05-21 MED ORDER — FENTANYL CITRATE (PF) 250 MCG/5ML IJ SOLN
INTRAMUSCULAR | Status: AC
  Filled 2018-05-21: qty 5

## 2018-05-21 MED ORDER — ONDANSETRON HCL 4 MG/2ML IJ SOLN
INTRAMUSCULAR | Status: DC | PRN
  Administered 2018-05-21: 4 mg via INTRAVENOUS

## 2018-05-21 MED ORDER — SODIUM CHLORIDE 0.9 % IV SOLN: INTRAVENOUS | Status: DC

## 2018-05-21 MED ORDER — SODIUM CHLORIDE 0.9 % IV SOLN
INTRAVENOUS | Status: DC
  Administered 2018-05-21: 08:00:00 via INTRAVENOUS

## 2018-05-21 MED ORDER — MIDAZOLAM HCL 2 MG/2ML IJ SOLN
INTRAMUSCULAR | Status: AC
  Filled 2018-05-21: qty 2

## 2018-05-21 MED ORDER — LIDOCAINE HCL 1 % IJ SOLN
INTRAMUSCULAR | Status: DC | PRN
  Administered 2018-05-21: 30 mL

## 2018-05-21 MED ORDER — LIDOCAINE HCL (PF) 1 % IJ SOLN
INTRAMUSCULAR | Status: AC
  Filled 2018-05-21: qty 30

## 2018-05-21 MED ORDER — PROPOFOL 10 MG/ML IV BOLUS
INTRAVENOUS | Status: DC | PRN
  Administered 2018-05-21: 20 mg via INTRAVENOUS

## 2018-05-21 MED ORDER — PROPOFOL 1000 MG/100ML IV EMUL
INTRAVENOUS | Status: AC
  Filled 2018-05-21: qty 200

## 2018-05-21 SURGICAL SUPPLY — 34 items
ARMBAND PINK RESTRICT EXTREMIT (MISCELLANEOUS) ×3 IMPLANT
CANISTER SUCT 3000ML PPV (MISCELLANEOUS) ×3 IMPLANT
CLIP VESOCCLUDE MED 24/CT (CLIP) IMPLANT
CLIP VESOCCLUDE MED 6/CT (CLIP) ×3 IMPLANT
CLIP VESOCCLUDE SM WIDE 24/CT (CLIP) IMPLANT
CLIP VESOCCLUDE SM WIDE 6/CT (CLIP) ×3 IMPLANT
COVER PROBE W GEL 5X96 (DRAPES) IMPLANT
COVER WAND RF STERILE (DRAPES) IMPLANT
DERMABOND ADVANCED (GAUZE/BANDAGES/DRESSINGS) ×6
DERMABOND ADVANCED .7 DNX12 (GAUZE/BANDAGES/DRESSINGS) ×3 IMPLANT
ELECT REM PT RETURN 9FT ADLT (ELECTROSURGICAL) ×3
ELECTRODE REM PT RTRN 9FT ADLT (ELECTROSURGICAL) ×1 IMPLANT
GLOVE BIO SURGEON STRL SZ7.5 (GLOVE) ×3 IMPLANT
GOWN STRL REUS W/ TWL LRG LVL3 (GOWN DISPOSABLE) ×2 IMPLANT
GOWN STRL REUS W/ TWL XL LVL3 (GOWN DISPOSABLE) ×1 IMPLANT
GOWN STRL REUS W/TWL LRG LVL3 (GOWN DISPOSABLE) ×4
GOWN STRL REUS W/TWL XL LVL3 (GOWN DISPOSABLE) ×2
KIT BASIN OR (CUSTOM PROCEDURE TRAY) ×3 IMPLANT
KIT TURNOVER KIT B (KITS) ×3 IMPLANT
NS IRRIG 1000ML POUR BTL (IV SOLUTION) ×3 IMPLANT
PACK CV ACCESS (CUSTOM PROCEDURE TRAY) ×3 IMPLANT
PAD ARMBOARD 7.5X6 YLW CONV (MISCELLANEOUS) ×6 IMPLANT
SUT MNCRL AB 4-0 PS2 18 (SUTURE) ×6 IMPLANT
SUT PROLENE 6 0 BV (SUTURE) ×6 IMPLANT
SUT SILK 2 0 (SUTURE) ×4
SUT SILK 2 0 SH (SUTURE) ×3 IMPLANT
SUT SILK 2-0 18XBRD TIE 12 (SUTURE) ×2 IMPLANT
SUT SILK 3 0 (SUTURE) ×2
SUT SILK 3-0 18XBRD TIE 12 (SUTURE) ×1 IMPLANT
SUT VIC AB 3-0 SH 27 (SUTURE) ×4
SUT VIC AB 3-0 SH 27X BRD (SUTURE) ×2 IMPLANT
TOWEL GREEN STERILE (TOWEL DISPOSABLE) ×3 IMPLANT
UNDERPAD 30X30 (UNDERPADS AND DIAPERS) ×3 IMPLANT
WATER STERILE IRR 1000ML POUR (IV SOLUTION) ×3 IMPLANT

## 2018-05-21 NOTE — Transfer of Care (Signed)
Immediate Anesthesia Transfer of Care Note  Patient: Elijah White  Procedure(s) Performed: LEFT BASILIC VEIN FISTULA SECOND STAGE (Left Arm Upper)  Patient Location: PACU  Anesthesia Type:MAC  Level of Consciousness: awake, alert  and oriented  Airway & Oxygen Therapy: Patient Spontanous Breathing and Patient connected to face mask oxygen  Post-op Assessment: Report given to RN and Post -op Vital signs reviewed and stable  Post vital signs: Reviewed and stable  Last Vitals:  Vitals Value Taken Time  BP 146/74 05/21/2018 10:05 AM  Temp    Pulse 65 05/21/2018 10:12 AM  Resp 14 05/21/2018 10:12 AM  SpO2 95 % 05/21/2018 10:12 AM  Vitals shown include unvalidated device data.  Last Pain:  Vitals:   05/21/18 0747  TempSrc:   PainSc: 0-No pain         Complications: No apparent anesthesia complications

## 2018-05-21 NOTE — H&P (Signed)
   History and Physical Update  The patient was interviewed and re-examined.  The patient's previous History and Physical has been reviewed and is unchanged from recent office visit. Plan for left arm second stage bvt creation.   Alissandra Geoffroy C. Donzetta Matters, MD [facility] Office: [phone] Pager: [phone]   05/21/2018, 7:56 AM

## 2018-05-21 NOTE — Anesthesia Postprocedure Evaluation (Signed)
Anesthesia Post Note  Patient: Elijah White  Procedure(s) Performed: LEFT BASILIC VEIN FISTULA SECOND STAGE (Left Arm Upper)     Patient location during evaluation: PACU Anesthesia Type: MAC Level of consciousness: awake and alert Pain management: pain level controlled Vital Signs Assessment: post-procedure vital signs reviewed and stable Respiratory status: spontaneous breathing, nonlabored ventilation and respiratory function stable Cardiovascular status: blood pressure returned to baseline and stable Postop Assessment: no apparent nausea or vomiting Anesthetic complications: no    Last Vitals:  Vitals:   05/21/18 1035 05/21/18 1052  BP: 131/65 138/72  Pulse: (!) 59 63  Resp: 11 16  Temp: (!) 36.2 C   SpO2: 98% 95%    Last Pain:  Vitals:   05/21/18 1035  TempSrc:   PainSc: 3                  Debhora Titus,W. EDMOND

## 2018-05-21 NOTE — Op Note (Signed)
    Patient name: Elijah White MRN: [id number] DOB: 06-10-1940 Sex: male  05/21/2018 Pre-operative Diagnosis: chronic kidney disease Post-operative diagnosis:  Same Surgeon:  Eda Paschal. Donzetta Matters, MD Assistant: Laurence Slate, PA Procedure Performed: Left second stage basilic vein fistula  Indications: 78 year old male with history of chronic kidney disease has previous creation of for stage basilic vein fistula now indicated for the second stage.  Findings: Fistula self measured approximately 1 cm diameter.  At completion there is a strong thrill as well is a palpable radial pulse at the wrist.   Procedure:  The patient was identified in the holding area and taken to the operating room where is placed supine operative table and MAC anesthesia induced.  He was sterilely prepped draped in the left upper extremity usual fashion antibiotics were administered and a timeout was called.  I began with injecting 1% lidocaine with epinephrine overlying the entirety of the fistula as well as the expected tunnel tract.  I then reopened the previous incision dissected down near the anastomosis identified the vein.  Through to skip incisions we then dissected out the entirety of the vein dividing branches between clips and ties.  When the vein was fully freed up we then clamped near the anastomosis marked of orientation and transected there.  I then flushed with heparinized saline marked of orientation and tunneled out laterally.  I then spatulated both ends.  I confirm strong inflow.  I sewed the veins and then with 6-0 Prolene suture.  Upon completion a very strong thrill was also palpable radial artery pulse the wrist confirmed with Doppler.  Satisfied with this we irrigated the wounds closed in layers with Vicryl Monocryl.  He tolerated procedure without immediate complication.  All counts were correct at completion.  EBL: 50 cc   Elberta Lachapelle C. Donzetta Matters, MD [facility] [place] [place] Office:  [phone] Pager: [phone]

## 2018-05-21 NOTE — Discharge Instructions (Signed)
° °  [facility] ° °Discharge Instructions ° °AV Fistula or Graft Surgery for Dialysis Access ° °Please refer to the following instructions for your post-procedure care. Your surgeon or physician assistant will discuss any changes with you. ° °Activity ° °You may drive the day following your surgery, if you are comfortable and no longer taking prescription pain medication. Resume full activity as the soreness in your incision resolves. ° °Bathing/Showering ° °You may shower after you go home. Keep your incision dry for 48 hours. Do not soak in a bathtub, hot tub, or swim until the incision heals completely. You may not shower if you have a hemodialysis catheter. ° °Incision Care ° °Clean your incision with mild soap and water after 48 hours. Pat the area dry with a clean towel. You do not need a bandage unless otherwise instructed. Do not apply any ointments or creams to your incision. You may have skin glue on your incision. Do not peel it off. It will come off on its own in about one week. Your arm may swell a bit after surgery. To reduce swelling use pillows to elevate your arm so it is above your heart. Your doctor will tell you if you need to lightly wrap your arm with an ACE bandage. ° °Diet ° °Resume your normal diet. There are not special food restrictions following this procedure. In order to heal from your surgery, it is CRITICAL to get adequate nutrition. Your body requires vitamins, minerals, and protein. Vegetables are the best source of vitamins and minerals. Vegetables also provide the perfect balance of protein. Processed food has little nutritional value, so try to avoid this. ° °Medications ° °Resume taking all of your medications. If your incision is causing pain, you may take over-the counter pain relievers such as acetaminophen (Tylenol). If you were prescribed a stronger pain medication, please be aware these medications can cause nausea and constipation. Prevent  nausea by taking the medication with a snack or meal. Avoid constipation by drinking plenty of fluids and eating foods with high amount of fiber, such as fruits, vegetables, and grains. Do not take Tylenol if you are taking prescription pain medications. ° ° ° ° °Follow up °Your surgeon may want to see you in the office following your access surgery. If so, this will be arranged at the time of your surgery. ° °Please call us immediately for any of the following conditions: ° °Increased pain, redness, drainage (pus) from your incision site °Fever of 101 degrees or higher °Severe or worsening pain at your incision site °Hand pain or numbness. ° °Reduce your risk of vascular disease: ° °Stop smoking. If you would like help, call [facility] at 1-800-QUIT-NOW (1-800-784-8669) or [place] at 336-586-4000 ° °Manage your cholesterol °Maintain a desired weight °Control your diabetes °Keep your blood pressure down ° °Dialysis ° °It will take several weeks to several months for your new dialysis access to be ready for use. Your surgeon will determine when it is OK to use it. Your nephrologist will continue to direct your dialysis. You can continue to use your Permcath until your new access is ready for use. ° °If you have any questions, please call the office at [phone]. ° °

## 2018-05-21 NOTE — Anesthesia Procedure Notes (Signed)
Procedure Name: MAC Date/Time: 05/21/2018 8:30 AM Performed by: Alain Marion, CRNA Pre-anesthesia Checklist: Patient identified, Patient being monitored, Emergency Drugs available and Suction available Oxygen Delivery Method: Simple face mask Placement Confirmation: positive ETCO2

## 2018-05-22 ENCOUNTER — Encounter (HOSPITAL_COMMUNITY): Payer: Self-pay | Admitting: Vascular Surgery

## 2018-05-29 ENCOUNTER — Telehealth: Payer: Self-pay

## 2018-05-30 ENCOUNTER — Telehealth: Payer: Self-pay

## 2018-06-02 ENCOUNTER — Ambulatory Visit (INDEPENDENT_AMBULATORY_CARE_PROVIDER_SITE_OTHER): Payer: Medicare Other

## 2018-06-02 ENCOUNTER — Telehealth: Payer: Self-pay

## 2018-06-02 DIAGNOSIS — I5032 Chronic diastolic (congestive) heart failure: Secondary | ICD-10-CM | POA: Diagnosis not present

## 2018-06-02 DIAGNOSIS — N179 Acute kidney failure, unspecified: Secondary | ICD-10-CM

## 2018-06-02 DIAGNOSIS — I130 Hypertensive heart and chronic kidney disease with heart failure and stage 1 through stage 4 chronic kidney disease, or unspecified chronic kidney disease: Secondary | ICD-10-CM

## 2018-06-02 DIAGNOSIS — E1121 Type 2 diabetes mellitus with diabetic nephropathy: Secondary | ICD-10-CM | POA: Diagnosis not present

## 2018-06-02 DIAGNOSIS — E1122 Type 2 diabetes mellitus with diabetic chronic kidney disease: Secondary | ICD-10-CM

## 2018-06-02 DIAGNOSIS — N183 Chronic kidney disease, stage 3 (moderate): Secondary | ICD-10-CM

## 2018-06-02 DIAGNOSIS — N185 Chronic kidney disease, stage 5: Secondary | ICD-10-CM

## 2018-06-02 NOTE — Chronic Care Management (AMB) (Signed)
  Chronic Care Management   Note  06/02/2018 Name: JAIDON ELLERY MRN: [id number] DOB: 02-23-1941   Chronic Care Management   Outreach Note  06/02/2018 Name: HAILE BOSLER MRN: [id number] DOB: 26-Jan-1941  Referred by: [name], MD Reason for referral : Chronic Care Management ([place] )   An unsuccessful telephone outreach was attempted today. The patient was referred to the case management team by [name] MD for assistance with Essential hypertension, type II Diabetes Mellitus with renal manifestations, acute on chronic diastolic CHF, Renal failure (ARF), Glaucoma w/vision loss of right eye.  I spoke with patient's daughter [name] today by telephone. She states now is not a good time and she will call me back. I confirmed [name] has my contact number.   Follow Up Plan: The CM team will reach out to the patient/daughter again over the next 7-10 days if no return call from patient's daughter.    Barb Merino, RN,CCM Care Management Coordinator [place] [facility]  Direct Phone: [phone]

## 2018-06-09 ENCOUNTER — Telehealth: Payer: Self-pay

## 2018-06-11 ENCOUNTER — Telehealth: Payer: Self-pay

## 2018-06-17 ENCOUNTER — Ambulatory Visit: Payer: Self-pay

## 2018-06-17 ENCOUNTER — Telehealth: Payer: Self-pay

## 2018-06-17 DIAGNOSIS — I10 Essential (primary) hypertension: Secondary | ICD-10-CM

## 2018-06-17 DIAGNOSIS — I130 Hypertensive heart and chronic kidney disease with heart failure and stage 1 through stage 4 chronic kidney disease, or unspecified chronic kidney disease: Secondary | ICD-10-CM

## 2018-06-17 DIAGNOSIS — E1122 Type 2 diabetes mellitus with diabetic chronic kidney disease: Secondary | ICD-10-CM

## 2018-06-17 DIAGNOSIS — I5032 Chronic diastolic (congestive) heart failure: Secondary | ICD-10-CM

## 2018-06-17 DIAGNOSIS — N185 Chronic kidney disease, stage 5: Principal | ICD-10-CM

## 2018-06-17 NOTE — Chronic Care Management (AMB) (Signed)
  Chronic Care Management   Social Work Note  06/17/2018 Name: LAMARK SCHUE MRN: [id number] DOB: Mar 02, 1941  NESTOR WIENEKE is a 78 y.o. year old male who sees [name], MD for primary care. The CCM team was consulted for assistance with chronic care management and care coordination.  I placed an outreach call to the patients daughter to follow up on goal progression of patient stated goal to obtain a life alert. SW was informed it was not a good time to talk and the patients daughter would return this SW call.  Follow Up Plan: SW will follow up with patient by phone over the next 14-21 days pending no return call is received.  [name], BSW, CDP [facility] / [facility] Care Management Social Worker [phone]  Total time spent performing care coordination and/or care management activities with the patient by phone or face to face = 8 minutes.

## 2018-06-18 ENCOUNTER — Telehealth: Payer: Self-pay

## 2018-06-18 ENCOUNTER — Ambulatory Visit: Payer: Self-pay

## 2018-06-18 DIAGNOSIS — N185 Chronic kidney disease, stage 5: Principal | ICD-10-CM

## 2018-06-18 DIAGNOSIS — E1122 Type 2 diabetes mellitus with diabetic chronic kidney disease: Secondary | ICD-10-CM

## 2018-06-18 DIAGNOSIS — I10 Essential (primary) hypertension: Secondary | ICD-10-CM

## 2018-06-18 DIAGNOSIS — I130 Hypertensive heart and chronic kidney disease with heart failure and stage 1 through stage 4 chronic kidney disease, or unspecified chronic kidney disease: Secondary | ICD-10-CM

## 2018-06-18 DIAGNOSIS — I5032 Chronic diastolic (congestive) heart failure: Secondary | ICD-10-CM

## 2018-06-18 NOTE — Chronic Care Management (AMB) (Signed)
  Chronic Care Management   Outreach Note  06/18/2018 Name: STEPHENS SHREVE MRN: [id number] DOB: 1940/12/05  Referred by: [name], MD Reason for referral : Chronic Care Management (CCM TELEPHONE FOLLOW UP )   Second unsuccessful telephone outreach was attempted today. The patient was referred to the case management team by [name] MD for assistance with Essential hypertension, type II Diabetes Mellitus with renal manifestations, acute on chronic diastolic CHF, Renal failure (ARF), Glaucoma w/vision loss of right eye.   Follow Up Plan: The CCM team will reach out to the patient again over the next 5-7 days.    Barb Merino, RN,CCM Care Management Coordinator [place] [facility]  Direct Phone: [phone]

## 2018-06-19 ENCOUNTER — Ambulatory Visit: Payer: Self-pay

## 2018-06-19 ENCOUNTER — Ambulatory Visit (INDEPENDENT_AMBULATORY_CARE_PROVIDER_SITE_OTHER): Payer: Medicare Other

## 2018-06-19 DIAGNOSIS — I10 Essential (primary) hypertension: Secondary | ICD-10-CM

## 2018-06-19 DIAGNOSIS — E1122 Type 2 diabetes mellitus with diabetic chronic kidney disease: Secondary | ICD-10-CM

## 2018-06-19 DIAGNOSIS — N185 Chronic kidney disease, stage 5: Secondary | ICD-10-CM

## 2018-06-19 DIAGNOSIS — I5032 Chronic diastolic (congestive) heart failure: Secondary | ICD-10-CM | POA: Diagnosis not present

## 2018-06-19 DIAGNOSIS — H5461 Unqualified visual loss, right eye, normal vision left eye: Secondary | ICD-10-CM

## 2018-06-19 NOTE — Patient Instructions (Signed)
Social Worker Visit Information  Goals we discussed today:  Goals Addressed            This Visit's Progress     Patient Stated   . "I need help getting a life alert for dad" (pt-stated)       Current Barriers:  . Limited access to caregiver- requests life alert system . Patients daughter is primary caregiver and having difficultly balancing between patient needs and her own. Has not been able to look into other systems as previously mentioned . 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 Telephonic follow up to the patients daughter [name] . Assessed the progress of obtaining a life alert for the patient - it is reported Mrs. Foster planned to have a life alert provided by [facility] program but due to COVID-19 they are no longer offering this service at this time due to limited hospital visitors and restricted home visits . Discussed plans [name] may have to obtain a life alert for the patient - Mrs. Foster plans to "order one from t.v" . Discussed future follow up plan between this SW and Mrs. Foster  Patient Self Care Activities:  . Daughter/primary caregiver is concerned about patients ability to independently call for help if needed.   Please see past updates related to this goal by clicking on the "Past Updates" button in the selected goal        Other   . "I'm really concerned about his home safety and ability to self-manage due his vision loss"   On track    Current Barriers:  . Functional limitations due to poor vision secondary to Glaucoma . Patient lives alone  Limited support/caregiver options  Nurse Case Manager Clinical Goal(s): Over the next 30 days, patient and caregiver [name], will verbalize increased knowledge and understanding related to home safety and  availability of community agencies offering assistance for the visually impaired.  Interventions:   Telephonic outreach for CCM follow up to patient/daughter [name]  Assessed the patients engagement level with [facility])   The patient has received services from this program but is no longer receiving home visits from the caseworker due to [place] 19  Encouraged Mrs [name] to outreach the caseworker regarding the specialized equipment ordered to assist in the patient determining color of clothing garments   Patient Self Care Activities:  . Patient self-administers his medications with difficulty due to poor vision (daughter is filling his pill box)  Patient attends all scheduled MD appointments (daughter provides transportation and accompanies the patient)  Please see past updates related to this goal by clicking on the "Past Updates" button in the selected goal          For information about COVID-19 or "Corona Virus", the following web resources may be helpful:  CDC: BeginnerSteps.be    [place]:  [email or web address]  Materials Provided: Verbal education about COVID 19 provided by phone  Follow Up Plan: SW will follow up with patient by phone over the next month.  [name], BSW, CDP [facility] / [facility] Care Management Social Worker [phone]

## 2018-06-19 NOTE — Chronic Care Management (AMB) (Signed)
Chronic Care Management    Clinical Social Work Follow Up Note  06/19/2018 Name: Elijah White MRN: [id number] DOB: [birth year]  Elijah White is a 78 y.o. year old male who is a primary care patient of [name], MD. The CCM team was consulted for assistance with [employer].   Review of patient status, including review of consultants reports, other relevant assessments, and collaboration with appropriate care team members and the patient's provider was performed as part of comprehensive patient evaluation and provision of chronic care management services.     I received a return call from the patients daughter Elijah White whom the patient has verbally consented to be active in his plan of care with the CCM team. Mrs. Elijah White reports she feels the patients vision is declining but he is still managing well in the home. He has decreased the types of food he prepares but is still able to prepare his own sandwiches and eggs. Mrs. Elijah White reports she will consider temporarily moving him in with her and her spouse if the patient declines and needs placement. At this time, the patient is safe to remain in the home per Mrs. Foster's report.  Goals Addressed            This Visit's Progress     Patient Stated   . "I need help getting a life alert for dad" (pt-stated)       Current Barriers:  . Limited access to caregiver- requests life alert system . Patients daughter is primary caregiver and having difficultly balancing between patient needs and her own. Has not been able to look into other systems as previously mentioned . 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 Telephonic follow up to the patients daughter Elijah White . Assessed the progress of obtaining a life alert for the  patient - it is reported Mrs. Foster planned to have a life alert provided by [facility] program but due to COVID-19 they are no longer offering this service at this time due to limited hospital visitors and restricted home visits . Discussed plans Mrs. Elijah White may have to obtain a life alert for the patient - Mrs. Foster plans to "order one from t.v" . Discussed future follow up plan between this SW and Mrs. Foster  Patient Self Care Activities:  . Daughter/primary caregiver is concerned about patients ability to independently call for help if needed.   Please see past updates related to this goal by clicking on the "Past Updates" button in the selected goal      Other   . "I'm really concerned about his home safety and ability to self-manage due his vision loss"   On track    Current Barriers:  . Functional limitations due to poor vision secondary to Glaucoma . Patient lives alone  Limited support/caregiver options  Nurse Case Manager Clinical Goal(s): Over the next 30 days, patient and caregiver Elijah White, will verbalize increased knowledge and understanding related to home safety and availability of community agencies offering assistance for the visually impaired.  Interventions:   Telephonic outreach for CCM follow up to patient/daughter Elijah White  Assessed the patients engagement level with [facility]   The patient has received services from this program but is no longer receiving home visits from the caseworker due to  COVID 48  Encouraged Mrs Elijah White to outreach the caseworker regarding the specialized equipment ordered to assist in the patient determining color of clothing garments   Patient Self Care Activities:  . Patient self-administers his medications with difficulty due to poor vision (daughter is filling his pill box)  Patient attends all scheduled MD appointments (daughter provides transportation and accompanies the patient)  Please see past  updates related to this goal by clicking on the "Past Updates" button in the selected goal        Follow Up Plan: SW will follow up with Mrs Elijah White in the next month. Mrs Elijah White has this SW contact number and is aware she may call if assistance is needed prior to the next scheduled call.   [name], BSW, CDP [facility] / [facility] Care Management Social Worker [phone]  Total time spent performing care coordination and/or care management activities with the patient by phone or face to face = 20 minutes.

## 2018-06-19 NOTE — Chronic Care Management (AMB) (Signed)
  Chronic Care Management   Follow Up Note   06/19/2018 Name: OAKLAN PERSONS MRN: [id number] DOB: [birth year]  Referred by: [name], MD Reason for referral : Chronic Care Management (CCM TELEPHONE FOLLOW UP CALL )   TORELL MINDER is a 78 y.o. year old male who is a primary care patient of [name], MD. The CCM team was consulted for assistance with chronic disease management and care coordination needs.    Review of patient status, including review of consultants reports, relevant laboratory and other test results, and collaboration with appropriate care team members and the patient's provider was performed as part of comprehensive patient evaluation and provision of chronic care management services.    Goals Addressed    . "My father wants to ask Dr. Baird Cancer about his CHF...he was told he doesn't have this condition"       Current Barriers:   Knowledge Deficits related to CHF disease process and self health management strategies - patient does not weigh self daily and does not understand CHF disease process  Functional limitations due poor vision (see other goal)  Nurse Case Manager Clinical Goal(s): Over the next 30 days, patient and caregiver will verbalize basic understanding of CHF disease process and self health management plan as evidenced by verbalization of basic understanding of CHF diagnosis, adherence to daily weight measurement, reporting findings outside established parameters, and adherence to prescribed medication regimen.   Interventions:   Received a voice message from daughter [name] stating she is returning my call; message states I can call her back anytime and she will take my call  Scheduled #3 CCM follow up call for 06/26/18   Patient Self Care Activities:  . Attends scheduled appointments (daughter transports and attends with patient)  Please see past updates related to this goal by clicking on the "Past Updates" button in the selected  goal         Telephone follow up appointment with CCM team member scheduled for: 06/26/18   Barb Merino, [place] Management Coordinator [place] [facility]  Direct Phone: [phone]

## 2018-06-23 ENCOUNTER — Ambulatory Visit: Payer: Medicare Other | Admitting: Internal Medicine

## 2018-06-26 ENCOUNTER — Telehealth: Payer: Self-pay

## 2018-06-30 ENCOUNTER — Telehealth: Payer: Self-pay

## 2018-07-07 ENCOUNTER — Ambulatory Visit (INDEPENDENT_AMBULATORY_CARE_PROVIDER_SITE_OTHER): Payer: Medicare Other

## 2018-07-07 ENCOUNTER — Telehealth: Payer: Self-pay

## 2018-07-07 DIAGNOSIS — E1122 Type 2 diabetes mellitus with diabetic chronic kidney disease: Secondary | ICD-10-CM | POA: Diagnosis not present

## 2018-07-07 DIAGNOSIS — N185 Chronic kidney disease, stage 5: Secondary | ICD-10-CM

## 2018-07-07 DIAGNOSIS — H5461 Unqualified visual loss, right eye, normal vision left eye: Secondary | ICD-10-CM

## 2018-07-07 DIAGNOSIS — I10 Essential (primary) hypertension: Secondary | ICD-10-CM | POA: Diagnosis not present

## 2018-07-07 DIAGNOSIS — I5032 Chronic diastolic (congestive) heart failure: Secondary | ICD-10-CM

## 2018-07-08 NOTE — Chronic Care Management (AMB) (Signed)
Chronic Care Management   Follow Up Note   07/07/2018 Name: Elijah White MRN: [id number] DOB: 01-Feb-1941  Referred by: [name], MD Reason for referral : Chronic Care Management (CCM TELEPHONE FOLLOW UP )   Elijah White is a 78 y.o. year old male who is a primary care patient of [name], MD. The CCM team was consulted for assistance with chronic disease management and care coordination needs.    Review of patient status, including review of consultants reports, relevant laboratory and other test results, and collaboration with appropriate care team members and the patient's provider was performed as part of comprehensive patient evaluation and provision of chronic care management services.    I spoke with Mr. Lingelbach daughter [name] today to follow up on his diabetes and ESRD.   Goals Addressed    . COMPLETED: "He's not taking anything for his Diabetes at this time"       Current Barriers:   Knowledge deficit related to recommendations for best treatment of Diabetes Mellitus  Functionally limited with self-monitoring CBG's due to poor vision secondary to Glaucoma  Nurse Case Manager Clinical Goal(s): Over the next 30 days, patient/daughter will verbalize understanding of MD recommendations for Pharmacological Management and Self Health Management of Diabetes.  Interventions:   Completed CCM telephone follow up with daughter [name]  Assessed for knowledge and understanding of patient's DM management - dtr understands disease process and Self Health management- pt is controlling with diet  Assessed for questions or concerns related to diabetes management - dtr denies having questions  Assessed for need for printed materials for reinforcement of understanding - dtr declines  Goal Met  Patient Self Care Activities:   Patient has limited ability to self-monitor CBG's due to visual impairment  Please see past updates related to this goal by clicking  on the "Past Updates" button in the selected goal     . COMPLETED: "I'm really concerned about his home safety and ability to self-manage due his vision loss"       Current Barriers:  . Functional limitations due to poor vision secondary to Glaucoma . Patient lives alone  Limited support/caregiver options  Nurse Case Manager Clinical Goal(s): Over the next 30 days, patient and caregiver [name], will verbalize increased knowledge and understanding related to home safety and availability of community agencies offering assistance for the visually impaired.  Interventions:   Completed CCM follow up call with daughter [name]  Assessed the patients engagement level with [facility] - patient has completed and has received specialized equipment to assist with ADL's and IADL's  Assessed for other home safety concerns related to patients ability to provide self care in his home - daughter voices having no further concerns at this time  Assessed for ongoing f/u with Eye Specialist - daughter is assisting with scheduling and attending MD appts, patients last eye exam to re-evaluate glaucoma was yesterday, 07/07/18, no change has been made with patient' treatment plan related to glaucoma  Goal complete  Patient Self Care Activities:  . Patient self-administers his medications with difficulty due to poor vision (daughter is filling his pill box)  Patient attends all scheduled MD appointments (daughter provides transportation and accompanies the patient)  Please see past updates related to this goal by clicking on the "Past Updates" button in the selected goal     . COMPLETED: "My father wants to ask Dr. Baird Cancer about his CHF...he was told he doesn't have  this condition"       Current Barriers:   Knowledge Deficits related to CHF disease process and self health management strategies - patient does not weigh self daily and does not understand CHF disease process   Functional limitations due poor vision (see other goal)  Nurse Case Manager Clinical Goal(s): Over the next 30 days, patient and caregiver will verbalize basic understanding of CHF disease process and self health management plan as evidenced by verbalization of basic understanding of CHF diagnosis, adherence to daily weight measurement, reporting findings outside established parameters, and adherence to prescribed medication regimen.   Interventions:   Completed CCM telephone follow up with daughter [name]  Assessed for knowledge deficit related to CHF and how to self manage - daughter denies having questions, voice understanding of disease process as discussed with PCP at last visit  Goal met  Patient Self Care Activities:  . Attends scheduled appointments (daughter transports and attends with patient)  Please see past updates related to this goal by clicking on the "Past Updates" button in the selected goal     . "We are waiting to have labs rechecked to see if he is ready to start dialysis"       Current Barriers:  [name] Knowledge Deficits related to plan for start of hemodialysis  Nurse Case Manager Clinical Goal(s):  [name] Over the next 60 days, patient will verbalize understanding of plan for start of outpatient hemodialysis.  Interventions:   Completed CCM telephone outreach with patient's daughter [name] . Evaluation of current treatment plan related to ESRD and patient's adherence to plan as established by provider . Discussed plans with patient for ongoing care management follow up and provided patient with direct contact information for care management team . Reviewed scheduled/upcoming provider appointments including: upcoming labs to assess for status of ESRD . Confirmed daughter has the CCM team contact #'s and provided hours of availability . Scheduled a CCM follow up visit for about 3 weeks  Patient Self Care Activities:  . Self administers medications as  prescribed . Attends all scheduled provider appointments . Performs ADL's independently . Performs IADL's independently  Initial goal documentation        The CCM team will reach out to the patient again over the next 14-21 days.    Barb Merino, RN,CCM Care Management Coordinator [place] [facility]  Direct Phone: [phone]

## 2018-07-08 NOTE — Patient Instructions (Signed)
Visit Information  Goals Addressed    . COMPLETED: "He's not taking anything for his Diabetes at this time"       Current Barriers:   Knowledge deficit related to recommendations for best treatment of Diabetes Mellitus  Functionally limited with self-monitoring CBG's due to poor vision secondary to Glaucoma  Nurse Case Manager Clinical Goal(s): Over the next 30 days, patient/daughter will verbalize understanding of MD recommendations for Pharmacological Management and Self Health Management of Diabetes.  Interventions:   Completed CCM telephone follow up with daughter [name]  Assessed for knowledge and understanding of patient's DM management - dtr understands disease process and Self Health management- pt is controlling with diet  Assessed for questions or concerns related to diabetes management - dtr denies having questions  Assessed for need for printed materials for reinforcement of understanding - dtr declines  Goal Met  Patient Self Care Activities:   Patient has limited ability to self-monitor CBG's due to visual impairment  Please see past updates related to this goal by clicking on the "Past Updates" button in the selected goal     . COMPLETED: "I'm really concerned about his home safety and ability to self-manage due his vision loss"       Current Barriers:  . Functional limitations due to poor vision secondary to Glaucoma . Patient lives alone  Limited support/caregiver options  Nurse Case Manager Clinical Goal(s): Over the next 30 days, patient and caregiver [name], will verbalize increased knowledge and understanding related to home safety and availability of community agencies offering assistance for the visually impaired.  Interventions:   Completed CCM follow up call with daughter [name]  Assessed the patients engagement level with [facility] - patient has completed and has received specialized equipment to assist with  ADL's and IADL's  Assessed for other home safety concerns related to patients ability to provide self care in his home - daughter voices having no further concerns at this time  Assessed for ongoing f/u with Eye Specialist - daughter is assisting with scheduling and attending MD appts, patients last eye exam to re-evaluate glaucoma was yesterday, 07/07/18, no change has been made with patient' treatment plan related to glaucoma  Goal complete  Patient Self Care Activities:  . Patient self-administers his medications with difficulty due to poor vision (daughter is filling his pill box)  Patient attends all scheduled MD appointments (daughter provides transportation and accompanies the patient)  Please see past updates related to this goal by clicking on the "Past Updates" button in the selected goal     . COMPLETED: "My father wants to ask Dr. Baird Cancer about his CHF...he was told he doesn't have this condition"       Current Barriers:   Knowledge Deficits related to CHF disease process and self health management strategies - patient does not weigh self daily and does not understand CHF disease process  Functional limitations due poor vision (see other goal)  Nurse Case Manager Clinical Goal(s): Over the next 30 days, patient and caregiver will verbalize basic understanding of CHF disease process and self health management plan as evidenced by verbalization of basic understanding of CHF diagnosis, adherence to daily weight measurement, reporting findings outside established parameters, and adherence to prescribed medication regimen.   Interventions:   Completed CCM telephone follow up with daughter [name]  Assessed for knowledge deficit related to CHF and how to self manage - daughter denies having questions, voice understanding of disease process  as discussed with PCP at last visit  Goal met  Patient Self Care Activities:  . Attends scheduled appointments (daughter transports and  attends with patient)  Please see past updates related to this goal by clicking on the "Past Updates" button in the selected goal     . "We are waiting to have labs rechecked to see if he is ready to start dialysis"       Current Barriers:  [name] Knowledge Deficits related to plan for start of hemodialysis  Nurse Case Manager Clinical Goal(s):  [name] Over the next 60 days, patient will verbalize understanding of plan for start of outpatient hemodialysis.  Interventions:   Completed CCM telephone outreach with patient's daughter [name] . Evaluation of current treatment plan related to ESRD and patient's adherence to plan as established by provider . Discussed plans with patient for ongoing care management follow up and provided patient with direct contact information for care management team . Reviewed scheduled/upcoming provider appointments including: upcoming labs to assess for status of ESRD . Confirmed daughter has the CCM team contact #'s and provided hours of availability . Scheduled a CCM follow up visit for about 3 weeks  Patient Self Care Activities:  . Self administers medications as prescribed . Attends all scheduled provider appointments . Performs ADL's independently . Performs IADL's independently  Initial goal documentation        The patient verbalized understanding of instructions provided today and declined a print copy of patient instruction materials.   The CCM team will reach out to the patient again over the next 14-21 days.   Barb Merino, RN,CCM Care Management Coordinator [place] [facility]  Direct Phone: [phone]

## 2018-07-22 ENCOUNTER — Ambulatory Visit: Payer: Self-pay

## 2018-07-22 DIAGNOSIS — I10 Essential (primary) hypertension: Secondary | ICD-10-CM

## 2018-07-22 DIAGNOSIS — E1122 Type 2 diabetes mellitus with diabetic chronic kidney disease: Secondary | ICD-10-CM

## 2018-07-22 DIAGNOSIS — I5032 Chronic diastolic (congestive) heart failure: Secondary | ICD-10-CM

## 2018-07-22 DIAGNOSIS — H5461 Unqualified visual loss, right eye, normal vision left eye: Secondary | ICD-10-CM

## 2018-07-22 NOTE — Patient Instructions (Signed)
Social Worker Visit Information  Goals we discussed today:  Goals Addressed            This Visit's Progress     Patient Stated   . COMPLETED: "I need help getting a life alert for dad" (pt-stated)       Current Barriers:  . Limited access to caregiver- requests life alert system . Patients daughter is primary caregiver and having difficultly balancing between patient needs and her own. Has not been able to look into other systems as previously mentioned . 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 Telephonic follow up to the patients daughter [name] . Determined the patient's caregiver has obtained a life alert with options for the patient to wear around his neck or wrist. The patient is actively wearing the alert around his neck daily.  Patient Self Care Activities:  . Daughter/primary caregiver is concerned about patients ability to independently call for help if needed.   Please see past updates related to this goal by clicking on the "Past Updates" button in the selected goal      . COMPLETED: "I need to make sure he will have transportation if he has to start dialysis" (pt-stated)       Current Barriers:  . Future transportation concerns (potential need for dialysis transportation)  Clinical Social Work Clinical Goal(s): Over the next 30 days, SW will follow up with the patients daughter/primary caregiver to discuss outcome of patients projected dialysis needs.  Interventions: . Telephonic follow up by CCM SW to the patients daughter/caregiver [name] . Determined the patient continues to receive transportation assistance from his daughter and is not in need of other resources at this time  Patient Self Care Activities:  . Dtr currently can provide transportation to all  appointments but shares concerns re: transportation needs should patient start dialysis   Please see past updates related to this goal by clicking on the "Past Updates" button in the selected goal          Materials Provided: No. Goals met.  Follow Up Plan: No further follow up with CCM SW scheduled at this time   Nadara Eaton, CDP [facility] Social Worker [phone]

## 2018-07-22 NOTE — Chronic Care Management (AMB) (Signed)
  Chronic Care Management    Clinical Social Work Follow Up Note  07/22/2018 Name: Elijah White MRN: [id number] DOB: [birth year]  Elijah White is a 78 y.o. year old male who is a primary care patient of [name], MD. The CCM team was consulted for assistance with [employer].   Review of patient status, including review of consultants reports, other relevant assessments, and collaboration with appropriate care team members and the patient's provider was performed as part of comprehensive patient evaluation and provision of chronic care management services.     I placed an outreach call to the patients daughter/caregiver [name] to follow up on progression of patient goals.   Goals Addressed            This Visit's Progress     Patient Stated   . COMPLETED: "I need help getting a life alert for dad" (pt-stated)       Current Barriers:  . Limited access to caregiver- requests life alert system . Patients daughter is primary caregiver and having difficultly balancing between patient needs and her own. Has not been able to look into other systems as previously mentioned . 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 Telephonic follow up to the patients daughter [name] . Determined the patient's caregiver has obtained a life alert with options for the patient to wear around his neck or wrist. The patient is actively wearing the alert around his neck daily.  Patient Self Care Activities:  . Daughter/primary caregiver is concerned about patients ability to independently call for help if needed.   Please see past updates related to this goal by clicking on the "Past Updates" button in the selected goal      . COMPLETED: "I need to make sure he will have transportation if he  has to start dialysis" (pt-stated)       Current Barriers:  . Future transportation concerns (potential need for dialysis transportation)  Clinical Social Work Clinical Goal(s): Over the next 30 days, SW will follow up with the patients daughter/primary caregiver to discuss outcome of patients projected dialysis needs.  Interventions: . Telephonic follow up by CCM SW to the patients daughter/caregiver [name] . Determined the patient continues to receive transportation assistance from his daughter and is not in need of other resources at this time  Patient Self Care Activities:  . Dtr currently can provide transportation to all appointments but shares concerns re: transportation needs should patient start dialysis   Please see past updates related to this goal by clicking on the "Past Updates" button in the selected goal         Follow Up Plan: No further CCM SW scheduled at this time. During today's call it is reported the patients vision is getting worse. Although the patients caregiver has acknowledged the possibility of placement in the future she does not feel the patient is ready at this time. CCM SW advised the patient to outreach CCM team if/when information is needed regarding long-term care placement.    [name], BSW, CDP [facility] / [facility] Care Management Social Worker [phone]  Total time spent performing care coordination and/or care management activities with the patient by phone or face to face = 8 minutes.

## 2018-07-29 ENCOUNTER — Telehealth: Payer: Self-pay

## 2018-07-29 ENCOUNTER — Ambulatory Visit: Payer: Self-pay

## 2018-07-29 DIAGNOSIS — I5032 Chronic diastolic (congestive) heart failure: Secondary | ICD-10-CM

## 2018-07-29 DIAGNOSIS — E1122 Type 2 diabetes mellitus with diabetic chronic kidney disease: Secondary | ICD-10-CM

## 2018-07-29 DIAGNOSIS — I10 Essential (primary) hypertension: Secondary | ICD-10-CM

## 2018-07-29 NOTE — Chronic Care Management (AMB) (Signed)
   Chronic Care Management   Outreach Note  07/29/2018 Name: DISHON KEHOE MRN: [id number] DOB: Feb 22, 1941  Referred by: [name], MD Reason for referral : Chronic Care Management (CCM RN Telephone Follow Up )   An unsuccessful telephone outreach was attempted today. The patient was referred to the case management team by [name] MD for assistance with chronic care management and care coordination.  I left a HIPAA compliant voice message for daughter [name], requesting a return phone call.   Follow Up Plan: The CCM team will reach out to the patient again over the next 5-7 days.    Barb Merino, RN,CCM Care Management Coordinator [place] [facility]  Direct Phone: [phone]

## 2018-07-31 ENCOUNTER — Other Ambulatory Visit: Payer: Self-pay | Admitting: Internal Medicine

## 2018-08-05 ENCOUNTER — Telehealth: Payer: Self-pay

## 2018-08-07 ENCOUNTER — Ambulatory Visit (INDEPENDENT_AMBULATORY_CARE_PROVIDER_SITE_OTHER): Payer: Medicare Other

## 2018-08-07 ENCOUNTER — Other Ambulatory Visit: Payer: Self-pay

## 2018-08-07 ENCOUNTER — Telehealth: Payer: Self-pay

## 2018-08-07 DIAGNOSIS — I10 Essential (primary) hypertension: Secondary | ICD-10-CM | POA: Diagnosis not present

## 2018-08-07 DIAGNOSIS — I5032 Chronic diastolic (congestive) heart failure: Secondary | ICD-10-CM | POA: Diagnosis not present

## 2018-08-07 DIAGNOSIS — E1122 Type 2 diabetes mellitus with diabetic chronic kidney disease: Secondary | ICD-10-CM | POA: Diagnosis not present

## 2018-08-07 DIAGNOSIS — N185 Chronic kidney disease, stage 5: Secondary | ICD-10-CM

## 2018-08-07 NOTE — Chronic Care Management (AMB) (Signed)
  Chronic Care Management   Follow Up Note   08/07/2018 Name: Elijah White MRN: [id number] DOB: 1940/09/10  Referred by: [name], MD Reason for referral : Chronic Care Management (CCM RNCM Telephone Follow Up )   DURREL MCNEE is a 78 y.o. year old male who is a primary care patient of [name], MD. The CCM team was consulted for assistance with chronic disease management and care coordination needs.    Review of patient status, including review of consultants reports, relevant laboratory and other test results, and collaboration with appropriate care team members and the patient's provider was performed as part of comprehensive patient evaluation and provision of chronic care management services.   I spoke with daughter [name] by telephone today to follow up on Mr. Lesch ESRD.    Goals Addressed    . "We are waiting to have labs rechecked to see if he is ready to start dialysis"       Current Barriers:  [name] Knowledge Deficits related to plan for start of hemodialysis  Nurse Case Manager Clinical Goal(s):  [name] Over the next 60 days, patient will verbalize understanding of plan for start of outpatient hemodialysis.  CCM RN CM Interventions:  Completed call with daughter [name] on 08/07/18:   . Evaluation of current treatment plan related to ESRD and patient's adherence to plan as established by provider . Discussed patient is stable and has not started hemodialysis; he will continue to be monitored every 3 months by Nephrologist  . Discussed currently, daughter and patient have no new health related concerns or home safety concerns . Reviewed upcoming provider appointments including: upcoming labs to assess for status of ESRD  Discussed plans with patient for ongoing care management follow up and provided patient with direct contact information for care management team  Patient Self Care Activities:  . Self administers medications as prescribed . Attends all  scheduled provider appointments . Performs ADL's independently . Performs IADL's independently  Please see past updates related to this goal by clicking on the "Past Updates" button in the selected goal         The care management team will reach out to the patient again in about 1 month.  Barb Merino, RN,CCM Care Management Coordinator [place] [facility]  Direct Phone: [phone]

## 2018-08-07 NOTE — Patient Instructions (Signed)
Visit Information  Goals Addressed    . "We are waiting to have labs rechecked to see if he is ready to start dialysis"       Current Barriers:  [name] Knowledge Deficits related to plan for start of hemodialysis  Nurse Case Manager Clinical Goal(s):  [name] Over the next 60 days, patient will verbalize understanding of plan for start of outpatient hemodialysis.  CCM RN CM Interventions:  Completed call with daughter [name] on 08/07/18:   . Evaluation of current treatment plan related to ESRD and patient's adherence to plan as established by provider . Discussed patient is stable and has not started hemodialysis; he will continue to be monitored every 3 months by Nephrologist  . Discussed currently, daughter and patient have no new health related concerns or home safety concerns . Reviewed upcoming provider appointments including: upcoming labs to assess for status of ESRD  Discussed plans with patient for ongoing care management follow up and provided patient with direct contact information for care management team  Patient Self Care Activities:  . Self administers medications as prescribed . Attends all scheduled provider appointments . Performs ADL's independently . Performs IADL's independently  Please see past updates related to this goal by clicking on the "Past Updates" button in the selected goal        The patient verbalized understanding of instructions provided today and declined a print copy of patient instruction materials.   The care management team will reach out to the patient again over the next 1 month.   Barb Merino, RN,CCM Care Management Coordinator [place] [facility]  Direct Phone: [phone]

## 2018-08-16 ENCOUNTER — Other Ambulatory Visit: Payer: Self-pay | Admitting: Internal Medicine

## 2018-09-04 ENCOUNTER — Telehealth: Payer: Self-pay

## 2018-09-22 ENCOUNTER — Other Ambulatory Visit: Payer: Self-pay | Admitting: Internal Medicine

## 2018-09-23 ENCOUNTER — Other Ambulatory Visit: Payer: Self-pay | Admitting: Internal Medicine

## 2018-09-23 NOTE — Telephone Encounter (Signed)
Gabapentin refill

## 2018-10-10 ENCOUNTER — Ambulatory Visit: Payer: Self-pay | Admitting: Surgery

## 2018-10-10 ENCOUNTER — Telehealth: Payer: Self-pay

## 2018-10-10 NOTE — H&P (Signed)
[name] Documented: 10/10/2018 9:32 AM Location: [facility] Patient #: [id number] DOB: 31-May-1940 Undefined / Language: Cleophus Molt / Race: Black or African American Male  History of Present Illness Gerding A. Shannette Tabares MD; 10/10/2018 9:49 AM) Patient words: Patient sent at the request of Dr. Arley Phenix for a cyst over his posterior scalp. It has been present for over 15 years. It has been relatively stable in size but does cause discomfort when he lays back or presses his head against it. There is no redness, drainage or fluctuance. It does itch from time to time he thinks it might be a little bit larger.  The patient is a 78 year old male.   Past Surgical History (Tanisha A. Owens Shark, [place]; 10/10/2018 9:32 AM) Cataract Surgery Bilateral. Dialysis Shunt / Fistula Knee Surgery Left.  Diagnostic Studies History (Tanisha A. Owens Shark, [place]; 10/10/2018 9:32 AM) Colonoscopy 5-10 years ago  Allergies (Tanisha A. Owens Shark, [place]; 10/10/2018 9:33 AM) No Known Drug Allergies [10/10/2018]: Allergies Reconciled  Medication History (Tanisha A. Owens Shark, [place]; 10/10/2018 9:34 AM) Atorvastatin Calcium (10MG  Tablet, Oral) Active. Calcitriol (0.5MCG Capsule, Oral) Active. Esomeprazole Magnesium (40MG  Capsule DR, Oral) Active. Gabapentin (100MG  Capsule, Oral) Active. Furosemide (80MG  Tablet, Oral) Active. Isosorbide Mononitrate ER (30MG  Tablet ER 24HR, Oral) Active. hydrALAZINE HCl (25MG  Tablet, Oral) Active. Carvedilol (3.125MG  Tablet, Oral) Active. Medications Reconciled  Social History (Tanisha A. Owens Shark, [place]; 10/10/2018 9:32 AM) No alcohol use No caffeine use No drug use Tobacco use Former smoker.  Family History (Tanisha A. Owens Shark, [place]; 10/10/2018 9:32 AM) Cerebrovascular Accident Father. Diabetes Mellitus Son. Heart Disease Mother.  Other Problems (Tanisha A. Owens Shark, [place]; 10/10/2018 9:32 AM) Arthritis Bladder Problems Chronic Renal Failure Syndrome Diabetes  M[place]itus Enlarged Prostate Gastroesophageal Reflux Disease High blood pressure Hypercholesterolemia Inguinal Hernia Other disease, cancer, significant illness Prostate Cancer     Review of Systems (Tanisha A. Brown RMA; 10/10/2018 9:32 AM) General Not Present- Appetite Loss, Chills, Fatigue, Fever, Night Sweats, Weight Gain and Weight Loss. Skin Not Present- Change in Wart/Mole, Dryness, Hives, Jaundice, New Lesions, Non-Healing Wounds, Rash and Ulcer. HEENT Present- Seasonal Allergies and Visual Disturbances. Not Present- Earache, Hearing Loss, Hoarseness, Nose Bleed, Oral Ulcers, Ringing in the Ears, Sinus Pain, Sore Throat, Wears glasses/contact lenses and Yellow Eyes. Respiratory Not Present- Bloody sputum, Chronic Cough, Difficulty Breathing, Snoring and Wheezing. Cardiovascular Not Present- Chest Pain, Difficulty Breathing Lying Down, Leg Cramps, Palpitations, Rapid Heart Rate, Shortness of Breath and Swelling of Extremities. Gastrointestinal Not Present- Abdominal Pain, Bloating, Bloody Stool, Change in Bowel Habits, Chronic diarrhea, Constipation, Difficulty Swallowing, Excessive gas, Gets full quickly at meals, Hemorrhoids, Indigestion, Nausea, Rectal Pain and Vomiting. Male Genitourinary Present- Urine Leakage. Not Present- Blood in Urine, Change in Urinary Stream, Frequency, Impotence, Nocturia, Painful Urination and Urgency. Musculoskeletal Present- Joint Stiffness. Not Present- Back Pain, Joint Pain, Muscle Pain, Muscle Weakness and Swelling of Extremities. Neurological Present- Tingling and Tremor. Not Present- Decreased Memory, Fainting, Headaches, Numbness, Seizures, Trouble walking and Weakness. Psychiatric Not Present- Anxiety, Bipolar, Change in Sleep Pattern, Depression, Fearful and Frequent crying.  Vitals (Tanisha A. Brown RMA; 10/10/2018 9:33 AM) 10/10/2018 9:32 AM Weight: 186 lb Height: 65in Body Surface Area: 1.92 m Body Mass Index: 30.95 kg/m  Temp.:  98.32F  Pulse: 92 (Regular)  BP: 148/84 (Sitting, Left Arm, Standard)        Physical Exam (Marlos A. Emmamae Mcnamara MD; 10/10/2018 9:49 AM)  General Mental Status-Alert. General Appearance-Consistent with stated age. Hydration-Well hydrated. Voice-Normal.  Integumentary Note: 5 cm x 5 cm  mobile subcutaneous mass posterior scalp favoring lipoma or possibly a sebaceous cyst  Chest and Lung Exam Chest and lung exam reveals -quiet, even and easy respiratory effort with no use of accessory muscles and on auscultation, normal breath sounds, no adventitious sounds and normal vocal resonance. Inspection Chest Wall - Normal. Back - normal.  Cardiovascular Cardiovascular examination reveals -normal heart sounds, regular rate and rhythm with no murmurs and normal pedal pulses bilaterally.  Neurologic Neurologic evaluation reveals -alert and oriented x 3 with no impairment of recent or remote memory. Mental Status-Normal.  Musculoskeletal Normal Exam - Left-Upper Extremity Strength Normal and Lower Extremity Strength Normal. Normal Exam - Right-Upper Extremity Strength Normal and Lower Extremity Strength Normal.    Assessment & Plan (Angad A. Domingue Coltrain MD; 10/10/2018 9:50 AM)  SCALP MASS (R22.0) Impression: 5 cm x 5 cm subcutaneous. Favor lipoma could be a sebaceous cyst. He will think things over and decide if he desires excision. Observation was also discussed as well.  Current Plans Pt Education - [facility] Free Text Education/Instructions: discussed with patient and provided information. The pathophysiology of skin & subcutaneous masses was discussed. Natural history risks without surgery were discussed. I recommended surgery to remove the mass. I explained the technique of removal with use of local anesthesia & possible need for more aggressive sedation/anesthesia for patient comfort.  Risks such as bleeding, infection, wound breakdown, heart attack, death, and  other risks were discussed. I noted a good likelihood this will help address the problem. Possibility that this will not correct all symptoms was explained. Possibility of regrowth/recurrence of the mass was discussed. We will work to minimize complications. Questions were answered. The patient expresses understanding & wishes to proceed with surgery.

## 2018-10-22 ENCOUNTER — Other Ambulatory Visit: Payer: Self-pay | Admitting: Internal Medicine

## 2018-10-28 ENCOUNTER — Other Ambulatory Visit: Payer: Self-pay

## 2018-10-28 ENCOUNTER — Ambulatory Visit (INDEPENDENT_AMBULATORY_CARE_PROVIDER_SITE_OTHER): Payer: Medicare Other | Admitting: Nurse Practitioner

## 2018-10-28 ENCOUNTER — Other Ambulatory Visit: Payer: Self-pay | Admitting: Internal Medicine

## 2018-10-28 ENCOUNTER — Encounter: Payer: Self-pay | Admitting: Nurse Practitioner

## 2018-10-28 VITALS — BP 170/82 | HR 70 | Temp 98.2°F | Ht 63.8 in | Wt 183.2 lb

## 2018-10-28 DIAGNOSIS — I5032 Chronic diastolic (congestive) heart failure: Secondary | ICD-10-CM | POA: Diagnosis not present

## 2018-10-28 DIAGNOSIS — N185 Chronic kidney disease, stage 5: Secondary | ICD-10-CM | POA: Diagnosis not present

## 2018-10-28 DIAGNOSIS — I132 Hypertensive heart and chronic kidney disease with heart failure and with stage 5 chronic kidney disease, or end stage renal disease: Secondary | ICD-10-CM

## 2018-10-28 DIAGNOSIS — Z23 Encounter for immunization: Secondary | ICD-10-CM

## 2018-10-28 DIAGNOSIS — E1122 Type 2 diabetes mellitus with diabetic chronic kidney disease: Secondary | ICD-10-CM

## 2018-10-28 DIAGNOSIS — I10 Essential (primary) hypertension: Secondary | ICD-10-CM

## 2018-10-28 NOTE — Progress Notes (Signed)
Subjective:     Patient ID: Elijah White , male    DOB: [birth year] , 78 y.o.   MRN: [id number]   Chief Complaint  Patient presents with  . Hypertension    patient's daughter stated he missed his appointment earlier this year due to covid. patient is needing refills     HPI  Seen by Dr. [name] 2 weeks ago and was doing okay.  Not doing any blood sugar checks.  Vision is getting worse continuing to see opthamologist about 5 weeks ago.  Has appt on Monday for 4 week check up.  Had labs done at Dr. [name] couple weeks ago.  Did not have his blood pressure   Hypertension This is a chronic problem. The current episode started more than 1 year ago. The problem is uncontrolled. Pertinent negatives include no anxiety, blurred vision, chest pain, headaches or palpitations. There are no associated agents to hypertension. Risk factors for coronary artery disease include obesity and sedentary lifestyle. Past treatments include angiotensin blockers. There is no history of angina or kidney disease. There is no history of chronic renal disease.     Past Medical History:  Diagnosis Date  . Anemia    low iron  . Arthritis   . Cancer [facility]) ?2006   prostate  . Chronic kidney disease    Stage 4  . Diabetes mellitus without complication ([place])   . Elevated cholesterol with high triglycerides   . GERD (gastroesophageal reflux disease)   . Hypertension   . Legally blind in left eye, as defined in [place]    has pinpoint vision in right eye     Family History  Problem Relation Age of Onset  . CAD Mother      Current Outpatient Medications:  .  acetaminophen (TYLENOL) 500 MG tablet, Take 1,000 mg by mouth every 6 (six) hours as needed for headache (pain)., Disp: , Rfl:  .  atorvastatin (LIPITOR) 10 MG tablet, TAKE 1 TABLET BY MOUTH EVERY DAY, Disp: 90 tablet, Rfl: 0 .  bimatoprost (LUMIGAN) 0.01 % SOLN, Place 1 drop into the right eye at bedtime., Disp: , Rfl:  .  blood glucose meter kit and supplies  KIT, Dispense based on patient and insurance preference. Use up to four times daily as directed. (FOR ICD-9 250.00, 250.01). For QAC - HS accuchecks., Disp: 1 each, Rfl: 1 .  brimonidine (ALPHAGAN) 0.15 % ophthalmic solution, Place 1 drop into the right eye 3 (three) times daily. , Disp: , Rfl: 11 .  calcitRIOL (ROCALTROL) 0.5 MCG capsule, Take 0.5 mcg by mouth daily., Disp: , Rfl:  .  dorzolamide-timolol (COSOPT) 22.3-6.8 MG/ML ophthalmic solution, Place 1 drop into the right eye 2 (two) times daily., Disp: , Rfl: 11 .  esomeprazole (NEXIUM) 40 MG capsule, TAKE 1 CAPSULE BY MOUTH ONCE DAILY (Patient taking differently: Take 40 mg by mouth at bedtime. ), Disp: 30 capsule, Rfl: 0 .  gabapentin (NEURONTIN) 100 MG capsule, Take 1 capsule (100 mg total) by mouth at bedtime., Disp: 90 capsule, Rfl: 1 .  hydrALAZINE (APRESOLINE) 25 MG tablet, TAKE 1 TABLET(25 MG) BY MOUTH TWICE DAILY, Disp: 180 tablet, Rfl: 1 .  Lancets (ONETOUCH DELICA PLUS [id number]) MISC, USE AS DIRECTED UP TO FOUR TIMES A DAY, Disp: , Rfl:  .  ONETOUCH VERIO test strip, USE AS DIRECTED TO TEST FOUR TIMES A DAY, Disp: 100 each, Rfl: 3 .  pilocarpine (PILOCAR) 4 % ophthalmic solution, Place 1 drop into the right eye 3 (  three) times daily., Disp: , Rfl:  .  BD PEN NEEDLE NANO U/F 32G X 4 MM MISC, USE SUBCUTANEOUS FOUR TIMES A DAY, Disp: , Rfl:  .  carvedilol (COREG) 3.125 MG tablet, TAKE 1 TABLET(3.125 MG) BY MOUTH TWICE DAILY WITH A MEAL, Disp: 180 tablet, Rfl: 1 .  furosemide (LASIX) 80 MG tablet, TAKE 1 TABLET BY MOUTH EVERY MORNING AND HALF TABLET BY MOUTH EVERY EVENING, Disp: 90 tablet, Rfl: 1 .  isosorbide mononitrate (IMDUR) 30 MG 24 hr tablet, TAKE 1 TABLET(30 MG) BY MOUTH DAILY, Disp: 90 tablet, Rfl: 1   No Known Allergies   Review of Systems  Constitutional: Negative.   Eyes: Negative for blurred vision.  Respiratory: Negative.   Cardiovascular: Negative.  Negative for chest pain, palpitations and leg swelling.  Endocrine:  Negative for polydipsia, polyphagia and polyuria.  Skin: Negative.   Neurological: Negative for dizziness and headaches.  Hematological: Negative.   Psychiatric/Behavioral: Negative.      Today's Vitals   10/28/18 0937  BP: (!) 170/82  Pulse: 70  Temp: 98.2 F (36.8 C)  TempSrc: Oral  Weight: 183 lb 3.2 oz (83.1 kg)  Height: 5' 3.8" (1.621 m)  PainSc: 0-No pain   Body mass index is 31.64 kg/m.   Objective:  Physical Exam Constitutional:      Appearance: Normal appearance.  Cardiovascular:     Rate and Rhythm: Normal rate and regular rhythm.     Pulses: Normal pulses.     Heart sounds: Normal heart sounds. No murmur.  Pulmonary:     Effort: Pulmonary effort is normal. No respiratory distress.     Breath sounds: Normal breath sounds.  Musculoskeletal: Normal range of motion.  Skin:    General: Skin is warm and dry.     Capillary Refill: Capillary refill takes less than 2 seconds.  Neurological:     General: No focal deficit present.     Mental Status: He is alert.         Assessment And Plan:     1. Essential hypertension poorly controlled  Chronic  Elevated today likely related to walking to the back exam room  No labs this visit will get recent labs from Dr. [name] done 2 weeks ago  2. Chronic diastolic CHF (congestive heart failure) (HCC)  Doing well, no recent exacerbaions  3. 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 Encounter for immunization  Influenza vaccine given in office  Advised to take Tylenol as needed for muscle aches or fever - Flu vaccine HIGH DOSE PF (Fluzone High dose)     Minette Brine, FNP    THE PATIENT IS ENCOURAGED TO PRACTICE SOCIAL DISTANCING DUE TO THE COVID-19 PANDEMIC.

## 2018-11-07 ENCOUNTER — Telehealth: Payer: Self-pay

## 2018-11-19 DIAGNOSIS — N189 Chronic kidney disease, unspecified: Secondary | ICD-10-CM | POA: Diagnosis not present

## 2018-11-19 DIAGNOSIS — N185 Chronic kidney disease, stage 5: Secondary | ICD-10-CM | POA: Diagnosis not present

## 2018-11-19 DIAGNOSIS — N139 Obstructive and reflux uropathy, unspecified: Secondary | ICD-10-CM | POA: Diagnosis not present

## 2018-11-19 DIAGNOSIS — D631 Anemia in chronic kidney disease: Secondary | ICD-10-CM | POA: Diagnosis not present

## 2018-11-19 DIAGNOSIS — I120 Hypertensive chronic kidney disease with stage 5 chronic kidney disease or end stage renal disease: Secondary | ICD-10-CM | POA: Diagnosis not present

## 2018-11-19 DIAGNOSIS — N2581 Secondary hyperparathyroidism of renal origin: Secondary | ICD-10-CM | POA: Diagnosis not present

## 2018-12-18 ENCOUNTER — Telehealth: Payer: Self-pay

## 2018-12-29 DIAGNOSIS — N189 Chronic kidney disease, unspecified: Secondary | ICD-10-CM | POA: Diagnosis not present

## 2018-12-29 DIAGNOSIS — N139 Obstructive and reflux uropathy, unspecified: Secondary | ICD-10-CM | POA: Diagnosis not present

## 2018-12-29 DIAGNOSIS — N2581 Secondary hyperparathyroidism of renal origin: Secondary | ICD-10-CM | POA: Diagnosis not present

## 2018-12-29 DIAGNOSIS — N185 Chronic kidney disease, stage 5: Secondary | ICD-10-CM | POA: Diagnosis not present

## 2018-12-29 DIAGNOSIS — I120 Hypertensive chronic kidney disease with stage 5 chronic kidney disease or end stage renal disease: Secondary | ICD-10-CM | POA: Diagnosis not present

## 2018-12-29 DIAGNOSIS — D631 Anemia in chronic kidney disease: Secondary | ICD-10-CM | POA: Diagnosis not present

## 2019-01-12 DIAGNOSIS — H401133 Primary open-angle glaucoma, bilateral, severe stage: Secondary | ICD-10-CM | POA: Diagnosis not present

## 2019-01-27 ENCOUNTER — Telehealth: Payer: Self-pay

## 2019-01-27 ENCOUNTER — Ambulatory Visit: Payer: Self-pay

## 2019-01-27 DIAGNOSIS — N185 Chronic kidney disease, stage 5: Secondary | ICD-10-CM

## 2019-01-27 DIAGNOSIS — I5032 Chronic diastolic (congestive) heart failure: Secondary | ICD-10-CM

## 2019-01-27 DIAGNOSIS — E1122 Type 2 diabetes mellitus with diabetic chronic kidney disease: Secondary | ICD-10-CM

## 2019-01-27 DIAGNOSIS — I10 Essential (primary) hypertension: Secondary | ICD-10-CM

## 2019-01-27 NOTE — Chronic Care Management (AMB) (Signed)
  Chronic Care Management   Outreach Note  01/27/2019 Name: Elijah White MRN: [id number] DOB: Jul 13, 1940  Referred by: [name], MD Reason for referral : Chronic Care Management (CCM RNCM Telephone Follow up)   An unsuccessful telephone outreach was attempted today. The patient was referred to the case management team by [name] MD for assistance with care management and care coordination.   Follow Up Plan: Telephone follow up appointment with care management team member scheduled for: 03/13/19  Barb Merino, RN, BSN, CCM Care Management Coordinator [place] [facility]  Direct Phone: [phone]

## 2019-01-28 ENCOUNTER — Ambulatory Visit (INDEPENDENT_AMBULATORY_CARE_PROVIDER_SITE_OTHER): Payer: Medicare Other

## 2019-01-28 DIAGNOSIS — E1121 Type 2 diabetes mellitus with diabetic nephropathy: Secondary | ICD-10-CM | POA: Diagnosis not present

## 2019-01-28 DIAGNOSIS — I5032 Chronic diastolic (congestive) heart failure: Secondary | ICD-10-CM | POA: Diagnosis not present

## 2019-01-28 DIAGNOSIS — I10 Essential (primary) hypertension: Secondary | ICD-10-CM

## 2019-01-28 NOTE — Patient Instructions (Signed)
Visit Information  Goals Addressed    . "Can you discuss Palliative Care with my dad"       Daughter stated Current Barriers:  [name] Knowledge Deficits related to advanced care via Palliative Care  Nurse Case Manager Clinical Goal(s):  [name] Over the next 30 days, patient will verbalize understanding of plan for Palliative Care referral   CCM RN CM Interventions:  01/28/19 call completed with daughter [name]  . Inbound call received from daughter [name], she is returning my follow up call  . Evaluation of current treatment plan related to advanced care planning  and patient's adherence to plan as established by provider. . Provided education to patient re: the availability of Palliative Care, what palliative care is verses Hospice and how to initiate . Collaborated with embedded BSW [name] regarding update on patient's declining health status and daughters request to assist with initiating a conversation with Mr. Pettingill about Palliative Care and assisting with Care Coordination  . Discussed plans with patient for ongoing care management follow up and provided patient with direct contact information for care management team . Discussed daughter fears his vision has worsened and he may be getting closer to not being able to live alone . Discussed [name] will call the CCM team to discuss next steps for Palliative Care following patient's f/u with Nephrologist, Dr. [name] set for 02/05/19  Patient Self Care Activities:  . Unable to independently perform self care  Initial goal documentation     . "We are waiting to have labs rechecked to see if he is ready to start dialysis"       Current Barriers:  [name] Knowledge Deficits related to plan for start of hemodialysis  Nurse Case Manager Clinical Goal(s):  [name] Over the next 60 days, patient will verbalize understanding of plan for start of outpatient hemodialysis  Goal Met . New - 01/28/19 Over the next 90 days, patient will continue to follow up with  Nephrology to determine plan of care for start of Hemodialysis   CCM RN CM Interventions:  01/28/19 call completed with daughter [name]  . Evaluation of current treatment plan related to ESRD and patient's adherence to plan as established by provider . Discussed patient's last creatinine level was at 7.0, however patient remains to be asymptomatic of urosepsis, hyperkalemia or Pulmonary edema . Reviewed upcoming provider appointments including: upcoming labs to assess for status of ESRD scheduled with Dr. [name] for 02/05/19  Discussed plans with patient for ongoing care management follow up and provided patient with direct contact information for care management team  Patient Self Care Activities:  . Self administers medications as prescribed . Attends all scheduled provider appointments . Performs ADL's independently . Performs IADL's independently  Please see past updates related to this goal by clicking on the "Past Updates" button in the selected goal        The patient verbalized understanding of instructions provided today and declined a print copy of patient instruction materials.   Telephone follow up appointment with care management team member scheduled for: 03/13/19  Barb Merino, RN, BSN, CCM Care Management Coordinator [place] [facility]  Direct Phone: [phone]

## 2019-01-28 NOTE — Chronic Care Management (AMB) (Signed)
Chronic Care Management   Follow Up Note   01/28/2019 Name: Elijah White MRN: [id number] DOB: 1941-02-27  Referred by: [name], MD Reason for referral : Chronic Care Management   Elijah White is a 78 y.o. year old male who is a primary care patient of [name], MD. The CCM team was consulted for assistance with chronic disease management and care coordination needs.    Review of patient status, including review of consultants reports, relevant laboratory and other test results, and collaboration with appropriate care team members and the patient's provider was performed as part of comprehensive patient evaluation and provision of chronic care management services.    SDOH (Social Determinants of Health) screening performed today: need for Advanced Care Planning/Palliative Care. See Care Plan for related entries.   Inbound call received from daughter Elijah White to discuss patient's ESRD and consideration for Palliative Care.   Outpatient Encounter Medications as of 01/28/2019  Medication Sig  . acetaminophen (TYLENOL) 500 MG tablet Take 1,000 mg by mouth every 6 (six) hours as needed for headache (pain).  Elijah White atorvastatin (LIPITOR) 10 MG tablet TAKE 1 TABLET BY MOUTH EVERY DAY  . BD PEN NEEDLE NANO U/F 32G X 4 MM MISC USE SUBCUTANEOUS FOUR TIMES A DAY  . bimatoprost (LUMIGAN) 0.01 % SOLN Place 1 drop into the right eye at bedtime.  . blood glucose meter kit and supplies KIT Dispense based on patient and insurance preference. Use up to four times daily as directed. (FOR ICD-9 250.00, 250.01). For QAC - HS accuchecks.  . brimonidine (ALPHAGAN) 0.15 % ophthalmic solution Place 1 drop into the right eye 3 (three) times daily.   . calcitRIOL (ROCALTROL) 0.5 MCG capsule Take 0.5 mcg by mouth daily.  . carvedilol (COREG) 3.125 MG tablet TAKE 1 TABLET(3.125 MG) BY MOUTH TWICE DAILY WITH A MEAL  . dorzolamide-timolol (COSOPT) 22.3-6.8 MG/ML ophthalmic solution Place 1 drop into the  right eye 2 (two) times daily.  Elijah White esomeprazole (NEXIUM) 40 MG capsule TAKE 1 CAPSULE BY MOUTH ONCE DAILY (Patient taking differently: Take 40 mg by mouth at bedtime. )  . furosemide (LASIX) 80 MG tablet TAKE 1 TABLET BY MOUTH EVERY MORNING AND HALF TABLET BY MOUTH EVERY EVENING  . gabapentin (NEURONTIN) 100 MG capsule Take 1 capsule (100 mg total) by mouth at bedtime.  . hydrALAZINE (APRESOLINE) 25 MG tablet TAKE 1 TABLET(25 MG) BY MOUTH TWICE DAILY  . isosorbide mononitrate (IMDUR) 30 MG 24 hr tablet TAKE 1 TABLET(30 MG) BY MOUTH DAILY  . Lancets (ONETOUCH DELICA PLUS [id number]) MISC USE AS DIRECTED UP TO FOUR TIMES A DAY  . ONETOUCH VERIO test strip USE AS DIRECTED TO TEST FOUR TIMES A DAY  . pilocarpine (PILOCAR) 4 % ophthalmic solution Place 1 drop into the right eye 3 (three) times daily.   No facility-administered encounter medications on file as of 01/28/2019.      Goals Addressed    . "Can you discuss Palliative Care with my dad"       Daughter stated Current Barriers:  Elijah White Knowledge Deficits related to advanced care via Palliative Care  Nurse Case Manager Clinical Goal(s):  Elijah White Over the next 30 days, patient will verbalize understanding of plan for Palliative Care referral   CCM RN CM Interventions:  01/28/19 call completed with daughter Elijah White  . Inbound call received from daughter Elijah White, she is returning my follow up call  . Evaluation of current treatment plan related to advanced care planning  and patient's  adherence to plan as established by provider. . Provided education to patient re: the availability of Palliative Care, what palliative care is verses Hospice and how to initiate . Collaborated with embedded BSW [name] regarding update on patient's declining health status and daughters request to assist with initiating a conversation with Mr. Elijah White about Palliative Care and assisting with Care Coordination  . Discussed plans with patient for ongoing care management  follow up and provided patient with direct contact information for care management team . Discussed daughter fears his vision has worsened and he may be getting closer to not being able to live alone . Discussed Elijah White will call the CCM team to discuss next steps for Palliative Care following patient's f/u with Nephrologist, Dr. Justin White set for 02/05/19  Patient Self Care Activities:  . Unable to independently perform self care  Initial goal documentation     . "We are waiting to have labs rechecked to see if he is ready to start dialysis"       Current Barriers:  Elijah White Knowledge Deficits related to plan for start of hemodialysis  Nurse Case Manager Clinical Goal(s):  Elijah White Over the next 60 days, patient will verbalize understanding of plan for start of outpatient hemodialysis  Goal Met . New - 01/28/19 Over the next 90 days, patient will continue to follow up with Nephrology to determine plan of care for start of Hemodialysis   CCM RN CM Interventions:  01/28/19 call completed with daughter Elijah White  . Evaluation of current treatment plan related to ESRD and patient's adherence to plan as established by provider . Discussed patient's last creatinine level was at 7.0, however patient remains to be asymptomatic of urosepsis, hyperkalemia or Pulmonary edema . Reviewed upcoming provider appointments including: upcoming labs to assess for status of ESRD scheduled with Dr. Justin White for 02/05/19  Discussed plans with patient for ongoing care management follow up and provided patient with direct contact information for care management team  Patient Self Care Activities:  . Self administers medications as prescribed . Attends all scheduled provider appointments . Performs ADL's independently . Performs IADL's independently  Please see past updates related to this goal by clicking on the "Past Updates" button in the selected goal         Telephone follow up appointment with care management team member  scheduled for: 03/13/19  Barb Merino, RN, BSN, CCM Care Management Coordinator [place] [facility]  Direct Phone: [phone]

## 2019-02-05 DIAGNOSIS — D631 Anemia in chronic kidney disease: Secondary | ICD-10-CM | POA: Diagnosis not present

## 2019-02-05 DIAGNOSIS — I120 Hypertensive chronic kidney disease with stage 5 chronic kidney disease or end stage renal disease: Secondary | ICD-10-CM | POA: Diagnosis not present

## 2019-02-05 DIAGNOSIS — N139 Obstructive and reflux uropathy, unspecified: Secondary | ICD-10-CM | POA: Diagnosis not present

## 2019-02-05 DIAGNOSIS — N2581 Secondary hyperparathyroidism of renal origin: Secondary | ICD-10-CM | POA: Diagnosis not present

## 2019-02-05 DIAGNOSIS — N189 Chronic kidney disease, unspecified: Secondary | ICD-10-CM | POA: Diagnosis not present

## 2019-02-05 DIAGNOSIS — N185 Chronic kidney disease, stage 5: Secondary | ICD-10-CM | POA: Diagnosis not present

## 2019-02-10 ENCOUNTER — Ambulatory Visit: Payer: Self-pay

## 2019-02-10 DIAGNOSIS — N179 Acute kidney failure, unspecified: Secondary | ICD-10-CM

## 2019-02-10 DIAGNOSIS — N185 Chronic kidney disease, stage 5: Secondary | ICD-10-CM

## 2019-02-10 DIAGNOSIS — I10 Essential (primary) hypertension: Secondary | ICD-10-CM

## 2019-02-10 DIAGNOSIS — E1121 Type 2 diabetes mellitus with diabetic nephropathy: Secondary | ICD-10-CM

## 2019-02-10 DIAGNOSIS — I5032 Chronic diastolic (congestive) heart failure: Secondary | ICD-10-CM

## 2019-02-10 DIAGNOSIS — H5461 Unqualified visual loss, right eye, normal vision left eye: Secondary | ICD-10-CM

## 2019-02-11 NOTE — Chronic Care Management (AMB) (Signed)
Chronic Care Management   Follow Up Note   02/10/2019 Name: Elijah White MRN: [id number] DOB: 03-Dec-1940  Referred by: Elijah Chard, MD Reason for referral : Chronic Care Management (CCM RNCM Telephone Inbound )   Elijah White is a 78 y.o. year old male who is a primary care patient of Elijah Chard, MD. The CCM team was consulted for assistance with chronic disease management and care coordination needs.    Review of patient status, including review of consultants reports, relevant laboratory and other test results, and collaboration with appropriate care team members and the patient's provider was performed as part of comprehensive patient evaluation and provision of chronic care management services.    SDOH (Social Determinants of Health) screening performed today: Transportation. See Care Plan for related entries.  Inbound call completed with Elijah White and daughter Elijah White requesting assistance with Care Coordination concerning a Palliative Care referral.    Outpatient Encounter Medications as of 02/10/2019  Medication Sig  . acetaminophen (TYLENOL) 500 MG tablet Take 1,000 mg by mouth every 6 (six) hours as needed for headache (pain).  [name] atorvastatin (LIPITOR) 10 MG tablet TAKE 1 TABLET BY MOUTH EVERY DAY  . BD PEN NEEDLE NANO U/F 32G X 4 MM MISC USE SUBCUTANEOUS FOUR TIMES A DAY  . bimatoprost (LUMIGAN) 0.01 % SOLN Place 1 drop into the right eye at bedtime.  . blood glucose meter kit and supplies KIT Dispense based on patient and insurance preference. Use up to four times daily as directed. (FOR ICD-9 250.00, 250.01). For QAC - HS accuchecks.  . brimonidine (ALPHAGAN) 0.15 % ophthalmic solution Place 1 drop into the right eye 3 (three) times daily.   . calcitRIOL (ROCALTROL) 0.5 MCG capsule Take 0.5 mcg by mouth daily.  . carvedilol (COREG) 3.125 MG tablet TAKE 1 TABLET(3.125 MG) BY MOUTH TWICE DAILY WITH A MEAL  . dorzolamide-timolol (COSOPT) 22.3-6.8 MG/ML ophthalmic  solution Place 1 drop into the right eye 2 (two) times daily.  [name] esomeprazole (NEXIUM) 40 MG capsule TAKE 1 CAPSULE BY MOUTH ONCE DAILY (Patient taking differently: Take 40 mg by mouth at bedtime. )  . furosemide (LASIX) 80 MG tablet TAKE 1 TABLET BY MOUTH EVERY MORNING AND HALF TABLET BY MOUTH EVERY EVENING  . gabapentin (NEURONTIN) 100 MG capsule Take 1 capsule (100 mg total) by mouth at bedtime.  . hydrALAZINE (APRESOLINE) 25 MG tablet TAKE 1 TABLET(25 MG) BY MOUTH TWICE DAILY  . isosorbide mononitrate (IMDUR) 30 MG 24 hr tablet TAKE 1 TABLET(30 MG) BY MOUTH DAILY  . Lancets (ONETOUCH DELICA PLUS [id number]) MISC USE AS DIRECTED UP TO FOUR TIMES A DAY  . ONETOUCH VERIO test strip USE AS DIRECTED TO TEST FOUR TIMES A DAY  . pilocarpine (PILOCAR) 4 % ophthalmic solution Place 1 drop into the right eye 3 (three) times daily.   No facility-administered encounter medications on file as of 02/10/2019.      Goals Addressed    . "Can you discuss Palliative Care with my dad"       Daughter stated Current Barriers:  [name] Knowledge Deficits related to advanced care via Palliative Care  Nurse Case Manager Clinical Goal(s):  [name] Over the next 30 days, patient will verbalize understanding of plan for Palliative Care referral   CCM RN CM Interventions:  02/10/19 call completed with daughter Elijah White and patient   . Inbound call received from daughter Elijah White to discuss Palliative Care . Outbound call placed to patient to provide education and support  related to Palliative Care and how to initiate this service . Evaluation of current treatment plan related to advanced care planning  and patient's adherence to plan as established by provider. . Provided education to patient re: the availability of Palliative Care, what palliative care is verses Hospice and how to initiate, discussed that Elijah White is thankful for the additional support and would like to initiate the Palliative Care referral at this time .  Collaborated with PCP Dr. Baird Cancer regarding patient and daughter's request to initiate Palliative Care . Discussed plans with patient for ongoing care management follow up and provided patient with direct contact information for care management team  Patient Self Care Activities:  . Unable to independently perform self care  Initial goal documentation     . "We are waiting to have labs rechecked to see if he is ready to start dialysis"       Current Barriers:  [name] Knowledge Deficits related to plan for start of Hemodialysis . Patient will need help with transportation and from Hemodialysis  Nurse Case Manager Clinical Goal(s):  [name] Over the next 60 days, patient will verbalize understanding of plan for start of outpatient hemodialysis  Goal Met . New - 01/28/19 Over the next 90 days, patient will continue to follow up with Nephrology to determine plan of care for start of Hemodialysis   CCM RN CM Interventions:  02/10/19 call completed with daughter Elijah White and patient   . Evaluation of current treatment plan related to ESRD and patient's adherence to plan as established by provider . Discussed patient followed up with Dr. [name] Nephrologist on 02/05/19; Patient's creatinine is now up to 8.0, however patient remains to be asymptomatic of urosepsis, hyperkalemia or Pulmonary edema- discussed patient will not start HD until he starts to "feel bad" per the patient . Education provided to Elijah White and daughter Elijah White, regarding the potential for Pulmonary edema and or Hyperkalemia - discussed these conditions warrant immediate medical assistance and can be life threatening - discussed Elijah White has an emergency alert necklace for emergencies - Educated Elijah White about the s/s suggestive of Pulmonary edema and or Hyperkalemia and discussed he would use his emergency alert necklace and or call his daughter immediately upon the start of symptoms . Discussed Elijah White has a left upper AVF that has never  been cannulated but Dr. [name] feels is functional when needed . Discussed with daughter Elijah White, the dialysis SW will assist with transportation for HD when needed . Collaboration with embedded [place] regarding follow up with daughter Elijah White to initiate assistance with transportation as it appears hemodialysis is in Elijah White near future  Discussed plans with patient for ongoing care management follow up and provided patient with direct contact information for care management team  Patient Self Care Activities:  . Self administers medications as prescribed . Attends all scheduled provider appointments . Performs ADL's independently . Performs IADL's independently  Please see past updates related to this goal by clicking on the "Past Updates" button in the selected goal           Telephone follow up appointment with care management team member scheduled for: 03/13/19  Barb Merino, RN, BSN, CCM Care Management Coordinator [place] [facility]  Direct Phone: [phone]

## 2019-02-11 NOTE — Patient Instructions (Signed)
Visit Information  Goals Addressed    . "Can you discuss Palliative Care with my dad"       Daughter stated Current Barriers:  [name] Knowledge Deficits related to advanced care via Palliative Care  Nurse Case Manager Clinical Goal(s):  [name] Over the next 30 days, patient will verbalize understanding of plan for Palliative Care referral   CCM RN CM Interventions:  02/10/19 call completed with daughter Elijah White and patient   . Inbound call received from daughter Elijah White to discuss Palliative Care . Outbound call placed to patient to provide education and support related to Palliative Care and how to initiate this service . Evaluation of current treatment plan related to advanced care planning  and patient's adherence to plan as established by provider. . Provided education to patient re: the availability of Palliative Care, what palliative care is verses Hospice and how to initiate, discussed that Elijah White is thankful for the additional support and would like to initiate the Palliative Care referral at this time . Collaborated with PCP Elijah White regarding patient and daughter's request to initiate Palliative Care . Discussed plans with patient for ongoing care management follow up and provided patient with direct contact information for care management team  Patient Self Care Activities:  . Unable to independently perform self care  Initial goal documentation     . "We are waiting to have labs rechecked to see if he is ready to start dialysis"       Current Barriers:  [name] Knowledge Deficits related to plan for start of Hemodialysis . Patient will need help with transportation and from Hemodialysis  Nurse Case Manager Clinical Goal(s):  [name] Over the next 60 days, patient will verbalize understanding of plan for start of outpatient hemodialysis  Goal Met . New - 01/28/19 Over the next 90 days, patient will continue to follow up with Nephrology to determine plan of care for start of Hemodialysis    CCM RN CM Interventions:  02/10/19 call completed with daughter Elijah White and patient   . Evaluation of current treatment plan related to ESRD and patient's adherence to plan as established by provider . Discussed patient followed up with Elijah White Nephrologist on 02/05/19; Patient's creatinine is now up to 8.0, however patient remains to be asymptomatic of urosepsis, hyperkalemia or Pulmonary edema- discussed patient will not start HD until he starts to "feel bad" per the patient . Education provided to Elijah White and daughter Elijah White, regarding the potential for Pulmonary edema and or Hyperkalemia - discussed these conditions warrant immediate medical assistance and can be life threatening - discussed Elijah White has an emergency alert necklace for emergencies - Educated Elijah White about the s/s suggestive of Pulmonary edema and or Hyperkalemia and discussed he would use his emergency alert necklace and or call his daughter immediately upon the start of symptoms . Discussed Elijah White has a left upper AVF that has never been cannulated but Elijah White feels is functional when needed . Discussed with daughter Elijah White, the dialysis SW will assist with transportation for HD when needed . Collaboration with embedded [place] regarding follow up with daughter Elijah White to initiate assistance with transportation as it appears hemodialysis is in [name] near future  Discussed plans with patient for ongoing care management follow up and provided patient with direct contact information for care management team  Patient Self Care Activities:  . Self administers medications as prescribed . Attends all scheduled provider appointments . Performs ADL's independently . Performs  IADL's independently  Please see past updates related to this goal by clicking on the "Past Updates" button in the selected goal        The patient verbalized understanding of instructions provided today and declined a print copy of  patient instruction materials.   Telephone follow up appointment with care management team member scheduled for: 03/13/19  Barb Merino, RN, BSN, CCM Care Management Coordinator [place] [facility]  Direct Phone: [phone]

## 2019-02-13 ENCOUNTER — Ambulatory Visit: Payer: Self-pay

## 2019-02-13 DIAGNOSIS — H5461 Unqualified visual loss, right eye, normal vision left eye: Secondary | ICD-10-CM

## 2019-02-13 DIAGNOSIS — E1165 Type 2 diabetes mellitus with hyperglycemia: Secondary | ICD-10-CM

## 2019-02-13 NOTE — Chronic Care Management (AMB) (Signed)
Chronic Care Management    Social Work Follow Up Note  02/13/2019 Name: Elijah White MRN: [id number] DOB: [birth year]  Elijah White is a 78 y.o. year old male who is a primary care patient of [name], MD. The CCM team was consulted for assistance with care coordination.   Review of patient status, including review of consultants reports, other relevant assessments, and collaboration with appropriate care team members and the patient's provider was performed as part of comprehensive patient evaluation and provision of chronic care management services.    SW placed a successful outbound call to the patients daughter, Elijah White to assist with care coordination needs pertaining to anticipated Dialysis treatment.   Outpatient Encounter Medications as of 02/13/2019  Medication Sig  . acetaminophen (TYLENOL) 500 MG tablet Take 1,000 mg by mouth every 6 (six) hours as needed for headache (pain).  [name] atorvastatin (LIPITOR) 10 MG tablet TAKE 1 TABLET BY MOUTH EVERY DAY  . BD PEN NEEDLE NANO U/F 32G X 4 MM MISC USE SUBCUTANEOUS FOUR TIMES A DAY  . bimatoprost (LUMIGAN) 0.01 % SOLN Place 1 drop into the right eye at bedtime.  . blood glucose meter kit and supplies KIT Dispense based on patient and insurance preference. Use up to four times daily as directed. (FOR ICD-9 250.00, 250.01). For QAC - HS accuchecks.  . brimonidine (ALPHAGAN) 0.15 % ophthalmic solution Place 1 drop into the right eye 3 (three) times daily.   . calcitRIOL (ROCALTROL) 0.5 MCG capsule Take 0.5 mcg by mouth daily.  . carvedilol (COREG) 3.125 MG tablet TAKE 1 TABLET(3.125 MG) BY MOUTH TWICE DAILY WITH A MEAL  . dorzolamide-timolol (COSOPT) 22.3-6.8 MG/ML ophthalmic solution Place 1 drop into the right eye 2 (two) times daily.  [name] esomeprazole (NEXIUM) 40 MG capsule TAKE 1 CAPSULE BY MOUTH ONCE DAILY (Patient taking differently: Take 40 mg by mouth at bedtime. )  . furosemide (LASIX) 80 MG tablet TAKE 1 TABLET BY MOUTH EVERY  MORNING AND HALF TABLET BY MOUTH EVERY EVENING  . gabapentin (NEURONTIN) 100 MG capsule Take 1 capsule (100 mg total) by mouth at bedtime.  . hydrALAZINE (APRESOLINE) 25 MG tablet TAKE 1 TABLET(25 MG) BY MOUTH TWICE DAILY  . isosorbide mononitrate (IMDUR) 30 MG 24 hr tablet TAKE 1 TABLET(30 MG) BY MOUTH DAILY  . Lancets (ONETOUCH DELICA PLUS [id number]) MISC USE AS DIRECTED UP TO FOUR TIMES A DAY  . ONETOUCH VERIO test strip USE AS DIRECTED TO TEST FOUR TIMES A DAY  . pilocarpine (PILOCAR) 4 % ophthalmic solution Place 1 drop into the right eye 3 (three) times daily.   No facility-administered encounter medications on file as of 02/13/2019.     Goals Addressed            This Visit's Progress   . Assist with SCAT application to overcome transportation barriers for future dialysis treatments       Daughter Stated:  Current Barriers:  . Declining kidney function with anticipated need for dialysis in the future . Daughter provides current transportation for patient needs but unable to commit to dialysis transportation due to work schedule . Impaired vision  Social Work Clinical Goal(s):  [name] Over the next 30 days, patient will work with SW to address concerns related to transportation  CCM SW Interventions: Completed 02/13/2019 . Received communication from RN Case Manager requesting SW involvement with transportation resources . Outbound call to the patients daughter and caregiver Elijah White who reports anticipation of dialysis treatment being needed in  the future due to continued decline in kidney function . Educated [name] on [employer] . Initiated SCAT application on behalf of the patient . Rosamaria Lints once the application is completed by the patients primary provider SW would submit to SCAT to determine eligibility . Reviewed patient health care transportation benefit as an option to utilize as needed while SCAT application being processed . Scheduled follow up call  over the next three weeks to assess progression of patient goal  Patient Self Care Activities:  . Attends all scheduled provider appointments . Calls provider office for new concerns or questions . Unable to independently provide transportation, relies on daughter for assistance  Initial goal documentation         Follow Up Plan: SW will follow up with patient by phone over the next three weeks.   [name], BSW, CDP Social Worker, Certified Dementia Practitioner [place] [facility] [place] [facility] [phone]  Total time spent performing care coordination and/or care management activities with the patient by phone or face to face = 14 minutes.

## 2019-02-13 NOTE — Patient Instructions (Signed)
Social Worker Visit Information  Goals we discussed today:  Goals Addressed            This Visit's Progress   . Assist with SCAT application to overcome transportation barriers for future dialysis treatments       Daughter Stated:  Current Barriers:  . Declining kidney function with anticipated need for dialysis in the future . Daughter provides current transportation for patient needs but unable to commit to dialysis transportation due to work schedule . Impaired vision  Social Work Clinical Goal(s):  [name] Over the next 30 days, patient will work with SW to address concerns related to transportation  CCM SW Interventions: Completed 02/13/2019 . Received communication from RN Case Manager requesting SW involvement with transportation resources . Outbound call to the patients daughter and caregiver [name] who reports anticipation of dialysis treatment being needed in the future due to continued decline in kidney function . Educated [name] on [employer] . Initiated SCAT application on behalf of the patient . Rosamaria Lints once the application is completed by the patients primary provider SW would submit to SCAT to determine eligibility . Reviewed patient health care transportation benefit as an option to utilize as needed while SCAT application being processed . Scheduled follow up call over the next three weeks to assess progression of patient goal  Patient Self Care Activities:  . Attends all scheduled provider appointments . Calls provider office for new concerns or questions . Unable to independently provide transportation, relies on daughter for assistance  Initial goal documentation         Materials Provided: Verbal education about SCAT resource provided by phone  Follow Up Plan: SW will follow up with patient by phone over the next three weeks  [name], BSW, CDP Social Worker, Certified Dementia Practitioner [place] [facility] [place]  [facility] [phone]

## 2019-02-19 ENCOUNTER — Other Ambulatory Visit: Payer: Self-pay | Admitting: Internal Medicine

## 2019-02-19 DIAGNOSIS — E1122 Type 2 diabetes mellitus with diabetic chronic kidney disease: Secondary | ICD-10-CM

## 2019-02-19 DIAGNOSIS — N185 Chronic kidney disease, stage 5: Secondary | ICD-10-CM

## 2019-02-19 NOTE — Progress Notes (Signed)
f pr

## 2019-02-23 DIAGNOSIS — N185 Chronic kidney disease, stage 5: Secondary | ICD-10-CM | POA: Diagnosis not present

## 2019-02-24 ENCOUNTER — Telehealth: Payer: Self-pay | Admitting: Internal Medicine

## 2019-02-24 ENCOUNTER — Ambulatory Visit (INDEPENDENT_AMBULATORY_CARE_PROVIDER_SITE_OTHER): Payer: Medicare Other

## 2019-02-24 ENCOUNTER — Telehealth: Payer: Self-pay

## 2019-02-24 DIAGNOSIS — H5461 Unqualified visual loss, right eye, normal vision left eye: Secondary | ICD-10-CM

## 2019-02-24 DIAGNOSIS — I10 Essential (primary) hypertension: Secondary | ICD-10-CM

## 2019-02-24 DIAGNOSIS — N185 Chronic kidney disease, stage 5: Secondary | ICD-10-CM | POA: Diagnosis not present

## 2019-02-24 DIAGNOSIS — E1122 Type 2 diabetes mellitus with diabetic chronic kidney disease: Secondary | ICD-10-CM

## 2019-02-24 DIAGNOSIS — N179 Acute kidney failure, unspecified: Secondary | ICD-10-CM | POA: Diagnosis not present

## 2019-02-24 DIAGNOSIS — I5032 Chronic diastolic (congestive) heart failure: Secondary | ICD-10-CM | POA: Diagnosis not present

## 2019-02-24 NOTE — Telephone Encounter (Signed)
Called pt daughter to ask questions per provider  -Does pt use walker or cane? Per pt daughter he uses a walker  -Will pt require someone to travel w/him when using scat? Per pt daughter he is visually impaired he will need someone to direct him getting on and off the bus to and from the office   Is pt able to ask for or follow directions?  Daughter stated she will be bringing her father to his upcoming appt and he is visually impaired someone would need to direct him

## 2019-02-24 NOTE — Chronic Care Management (AMB) (Signed)
Chronic Care Management   Follow Up Note   02/24/2019 Name: Elijah White MRN: [id number] DOB: 06-26-1940  Referred by: [name], MD Reason for referral : Chronic Care Management (CCM RNCM Telephone Follow up )   Elijah White is a 78 y.o. year old male who is a primary care patient of [name], MD. The CCM team was consulted for assistance with chronic disease management and care coordination needs.    Review of patient status, including review of consultants reports, relevant laboratory and other test results, and collaboration with appropriate care team members and the patient's provider was performed as part of comprehensive patient evaluation and provision of chronic care management services.    SDOH (Social Determinants of Health) screening performed today: None. See Care Plan for related entries.   Inbound call received from patient's daughter Elijah White today for a status update regarding the Palliative Care referral.   Outpatient Encounter Medications as of 02/24/2019  Medication Sig  . acetaminophen (TYLENOL) 500 MG tablet Take 1,000 mg by mouth every 6 (six) hours as needed for headache (pain).  [name] atorvastatin (LIPITOR) 10 MG tablet TAKE 1 TABLET BY MOUTH EVERY DAY  . BD PEN NEEDLE NANO U/F 32G X 4 MM MISC USE SUBCUTANEOUS FOUR TIMES A DAY  . bimatoprost (LUMIGAN) 0.01 % SOLN Place 1 drop into the right eye at bedtime.  . blood glucose meter kit and supplies KIT Dispense based on patient and insurance preference. Use up to four times daily as directed. (FOR ICD-9 250.00, 250.01). For QAC - HS accuchecks.  . brimonidine (ALPHAGAN) 0.15 % ophthalmic solution Place 1 drop into the right eye 3 (three) times daily.   . calcitRIOL (ROCALTROL) 0.5 MCG capsule Take 0.5 mcg by mouth daily.  . carvedilol (COREG) 3.125 MG tablet TAKE 1 TABLET(3.125 MG) BY MOUTH TWICE DAILY WITH A MEAL  . dorzolamide-timolol (COSOPT) 22.3-6.8 MG/ML ophthalmic solution Place 1 drop into  the right eye 2 (two) times daily.  [name] esomeprazole (NEXIUM) 40 MG capsule TAKE 1 CAPSULE BY MOUTH ONCE DAILY (Patient taking differently: Take 40 mg by mouth at bedtime. )  . furosemide (LASIX) 80 MG tablet TAKE 1 TABLET BY MOUTH EVERY MORNING AND HALF TABLET BY MOUTH EVERY EVENING  . gabapentin (NEURONTIN) 100 MG capsule Take 1 capsule (100 mg total) by mouth at bedtime.  . hydrALAZINE (APRESOLINE) 25 MG tablet TAKE 1 TABLET(25 MG) BY MOUTH TWICE DAILY  . isosorbide mononitrate (IMDUR) 30 MG 24 hr tablet TAKE 1 TABLET(30 MG) BY MOUTH DAILY  . Lancets (ONETOUCH DELICA PLUS [id number]) MISC USE AS DIRECTED UP TO FOUR TIMES A DAY  . ONETOUCH VERIO test strip USE AS DIRECTED TO TEST FOUR TIMES A DAY  . pilocarpine (PILOCAR) 4 % ophthalmic solution Place 1 drop into the right eye 3 (three) times daily.   No facility-administered encounter medications on file as of 02/24/2019.     Goals Addressed    . "Can you discuss Palliative Care with my dad"       Daughter stated Current Barriers:  [name] Knowledge Deficits related to advanced care via Palliative Care  Nurse Case Manager Clinical Goal(s):  [name] Over the next 30 days, patient will verbalize understanding of plan for Palliative Care referral   CCM RN CM Interventions:  02/24/19 call completed with daughter Elijah White and patient   . Inbound call received from daughter Elijah White for an update status regarding patient's Palliative Care referral  . Determined the referral for Palliative  Care was placed to [facility] by Dr. Sanders on 02/19/19 and has been authorized . Sent in basket message to [facility] NP Amy Daniels requesting confirmation that this referral was received- response received per Amy, this referral has been processed and assigned to Palliative Care provider Shirley Cates who will be contacting [name] to schedule his intake visit ASAP . Discussed plans with patient for ongoing care management follow up and provided patient with direct  contact information for care management team  Patient Self Care Activities:  . Unable to independently perform self care  Please see past updates related to this goal by clicking on the "Past Updates" button in the selected goal         Telephone follow up appointment with care management team member scheduled for: 03/13/19   , RN, BSN, CCM Care Management Coordinator [facility]  Direct Phone: [phone]    

## 2019-02-24 NOTE — Patient Instructions (Addendum)
Visit Information  Goals Addressed    . "Can you discuss Palliative Care with my dad"       Daughter stated Current Barriers:  [name] Knowledge Deficits related to advanced care via Palliative Care  Nurse Case Manager Clinical Goal(s):  [name] Over the next 30 days, patient will verbalize understanding of plan for Palliative Care referral   CCM RN CM Interventions:  02/24/19 call completed with daughter [name] and patient   . Inbound call received from daughter [name] for an update status regarding patient's Palliative Care referral  . Determined the referral for Palliative Care was placed to [facility] by Dr. Baird Cancer on 02/19/19 and has been authorized . Sent in basket message to [employer] NP Amy Olena Heckle requesting confirmation that this referral was received- response received per Amy, this referral has been processed and assigned to Palliative Care provider Gonzella Lex who will be contacting Mr. Xiong to schedule his intake visit ASAP . Discussed plans with patient for ongoing care management follow up and provided patient with direct contact information for care management team  Patient Self Care Activities:  . Unable to independently perform self care  Please see past updates related to this goal by clicking on the "Past Updates" button in the selected goal         The patient verbalized understanding of instructions provided today and declined a print copy of patient instruction materials.   Telephone follow up appointment with care management team member scheduled for: 03/13/19  Barb Merino, RN, BSN, CCM Care Management Coordinator [place] [facility]  Direct Phone: [phone]

## 2019-02-24 NOTE — Telephone Encounter (Signed)
Telephone call to patient's daughter [name] to offer palliative care services. She will speak with her patient regarding palliative care and if he prefers an in person visit or telehealth and call us back.

## 2019-02-25 ENCOUNTER — Ambulatory Visit: Payer: Medicare Other | Admitting: Nurse Practitioner

## 2019-02-25 ENCOUNTER — Telehealth: Payer: Self-pay

## 2019-02-25 ENCOUNTER — Ambulatory Visit: Payer: Medicare Other | Admitting: Internal Medicine

## 2019-02-25 ENCOUNTER — Encounter: Payer: Medicare Other | Admitting: Nurse Practitioner

## 2019-02-25 ENCOUNTER — Ambulatory Visit: Payer: Medicare Other

## 2019-03-03 ENCOUNTER — Encounter: Payer: Self-pay | Admitting: Nurse Practitioner

## 2019-03-03 ENCOUNTER — Other Ambulatory Visit: Payer: Self-pay

## 2019-03-03 ENCOUNTER — Ambulatory Visit (INDEPENDENT_AMBULATORY_CARE_PROVIDER_SITE_OTHER): Payer: Medicare Other

## 2019-03-03 ENCOUNTER — Ambulatory Visit (INDEPENDENT_AMBULATORY_CARE_PROVIDER_SITE_OTHER): Payer: Medicare Other | Admitting: Nurse Practitioner

## 2019-03-03 VITALS — BP 138/62 | HR 76 | Temp 98.2°F | Ht 64.0 in | Wt 181.2 lb

## 2019-03-03 VITALS — BP 138/62 | HR 76 | Temp 98.2°F | Ht 64.0 in | Wt 181.0 lb

## 2019-03-03 DIAGNOSIS — N185 Chronic kidney disease, stage 5: Secondary | ICD-10-CM

## 2019-03-03 DIAGNOSIS — N186 End stage renal disease: Secondary | ICD-10-CM

## 2019-03-03 DIAGNOSIS — Z23 Encounter for immunization: Secondary | ICD-10-CM | POA: Diagnosis not present

## 2019-03-03 DIAGNOSIS — Z992 Dependence on renal dialysis: Secondary | ICD-10-CM

## 2019-03-03 DIAGNOSIS — Z0000 Encounter for general adult medical examination without abnormal findings: Secondary | ICD-10-CM

## 2019-03-03 DIAGNOSIS — H401133 Primary open-angle glaucoma, bilateral, severe stage: Secondary | ICD-10-CM

## 2019-03-03 DIAGNOSIS — I5032 Chronic diastolic (congestive) heart failure: Secondary | ICD-10-CM

## 2019-03-03 DIAGNOSIS — E1122 Type 2 diabetes mellitus with diabetic chronic kidney disease: Secondary | ICD-10-CM | POA: Diagnosis not present

## 2019-03-03 MED ORDER — PREVNAR 13 IM SUSP: 0.5000 mL | INTRAMUSCULAR | 0 refills | Status: AC

## 2019-03-03 NOTE — Progress Notes (Signed)
This visit occurred during the SARS-CoV-2 public health emergency.  Safety protocols were in place, including screening questions prior to the visit, additional usage of staff PPE, and extensive cleaning of exam room while observing appropriate contact time as indicated for disinfecting solutions.  Subjective:   Elijah White is a 78 y.o. male who presents for Medicare Annual/Subsequent preventive examination.  Review of Systems:  n/a Cardiac Risk Factors include: advanced age (>36mn, >>38women);diabetes mellitus;dyslipidemia;hypertension;male gender;obesity (BMI >30kg/m2);sedentary lifestyle     Objective:    Vitals: BP 138/62 (BP Location: Right Arm, Patient Position: Sitting, Cuff Size: Normal)   Pulse 76   Temp 98.2 F (36.8 C) (Oral)   Ht _0  (1.626 m)   Wt 181 lb 3.2 oz (82.2 kg)   SpO2 99%   BMI 31.10 kg/m   Body mass index is 31.1 kg/m.  Advanced Directives 03/03/2019 03/25/2018 03/20/2018 02/19/2018 10/22/2017 09/02/2017 08/13/2016  Does Patient Have a Medical Advance Directive? Yes _1  No  Type of Advance Directive Living will - - - - - -  Would patient like information on creating a medical advance directive? - No - Patient declined No - Patient declined Yes (MAU/Ambulatory/Procedural Areas - Information given) No - Patient declined No - Patient declined No - Patient declined    Tobacco Social History   Tobacco Use  Smoking Status Former Smoker  . Packs/day: 0.50  . Quit date: 03/23/2018  . Years since quitting: 0.9  Smokeless Tobacco Never Used     Counseling given: Not Answered   Clinical Intake:  Pre-visit preparation completed: Yes  Pain : No/denies pain     Nutritional Status: BMI > 30  Obese Nutritional Risks: None Diabetes: Yes  How often do you need to have someone help you when you read instructions, pamphlets, or other written materials from your doctor or pharmacy?: 5 - Always What is the last grade level you completed in  school?: 12th grade  Interpreter Needed?: No  Information entered by :: NAllen LPN  Past Medical History:  Diagnosis Date  . Anemia    low iron  . Arthritis   . Cancer ([facility] ?2006   prostate  . Chronic kidney disease    Stage 4  . Diabetes mellitus without complication ([place]   . Elevated cholesterol with high triglycerides   . GERD (gastroesophageal reflux disease)   . Hypertension   . 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 AV FISTULA PLACEMENT Left 03/20/2018   Procedure: ARTERIOVENOUS (AV) FISTULA CREATION ARM;  Surgeon: CWaynetta Sandy MD;  [facility]  Service: Vascular;  Laterality: Left;  . BASCILIC VEIN TRANSPOSITION Left 05/21/2018   Procedure: LEFT BASILIC VEIN FISTULA SECOND STAGE;  Surgeon: CWaynetta Sandy MD;  [facility]  Service: Vascular;  Laterality: Left;  . COLONOSCOPY    . GLAUCOMA SURGERY  2019   multiple surgeries  . KNEE ARTHROSCOPY    . PROSTATE SURGERY     Family History  Problem Relation Age of Onset  . CAD Mother    Social History   Socioeconomic History  . Marital status: Widowed    Spouse name: Not on file  . Number of children: Not on file  . Years of education: Not on file  . Highest education level: Not on file  Occupational History  . Occupation: retired  Tobacco Use  . Smoking status: Former Smoker    Packs/day: 0.50    Quit date: 03/23/2018    Years since quitting: 0.9  . Smokeless tobacco: Never Used  Substance and Sexual Activity  . Alcohol use: No  . Drug use: No  . Sexual activity: Not Currently  Other Topics Concern  . Not on file  Social History Narrative  . Not on file   Social Determinants of Health   Financial Resource Strain: Low Risk   . Difficulty of Paying Living Expenses: Not hard at all  Food Insecurity: No Food Insecurity  . Worried About Charity fundraiser in the Last Year:  Never true  . Ran Out of Food in the Last Year: Never true  Transportation Needs: No Transportation Needs  . Lack of Transportation (Medical): No  . Lack of Transportation (Non-Medical): No  Physical Activity: Inactive  . Days of Exercise per Week: 0 days  . Minutes of Exercise per Session: 0 min  Stress: No Stress Concern Present  . Feeling of Stress : Not at all  Social Connections:   . Frequency of Communication with Friends and Family: Not on file  . Frequency of Social Gatherings with Friends and Family: Not on file  . Attends Religious Services: Not on file  . Active Member of Clubs or Organizations: Not on file  . Attends Archivist Meetings: Not on file  . Marital Status: Not on file    Outpatient Encounter Medications as of 03/03/2019  Medication Sig  . acetaminophen (TYLENOL) 500 MG tablet Take 1,000 mg by mouth every 6 (six) hours as needed for headache (pain).  [name] atorvastatin (LIPITOR) 10 MG tablet TAKE 1 TABLET BY MOUTH EVERY DAY  . BD PEN NEEDLE NANO U/F 32G X 4 MM MISC USE SUBCUTANEOUS FOUR TIMES A DAY  . bimatoprost (LUMIGAN) 0.01 % SOLN Place 1 drop into the right eye at bedtime.  . blood glucose meter kit and supplies KIT Dispense based on patient and insurance preference. Use up to four times daily as directed. (FOR ICD-9 250.00, 250.01). For QAC - HS accuchecks.  . brimonidine (ALPHAGAN) 0.15 % ophthalmic solution Place 1 drop into the right eye 3 (three) times daily.   . calcitRIOL (ROCALTROL) 0.5 MCG capsule Take 0.5 mcg by mouth daily.  . carvedilol (COREG) 3.125 MG tablet TAKE 1 TABLET(3.125 MG) BY MOUTH TWICE DAILY WITH A MEAL  . dorzolamide-timolol (COSOPT) 22.3-6.8 MG/ML ophthalmic solution Place 1 drop into the right eye 2 (two) times daily.  [name] esomeprazole (NEXIUM) 40 MG capsule TAKE 1 CAPSULE BY MOUTH ONCE DAILY (Patient taking differently: Take 40 mg by mouth at bedtime. )  . furosemide (LASIX) 80 MG tablet TAKE 1 TABLET BY MOUTH EVERY MORNING  AND HALF TABLET BY MOUTH EVERY EVENING  . gabapentin (NEURONTIN) 100 MG capsule Take 1 capsule (100 mg total) by mouth at bedtime.  . hydrALAZINE (APRESOLINE) 25 MG tablet TAKE 1 TABLET(25 MG) BY MOUTH TWICE DAILY  . isosorbide mononitrate (IMDUR) 30 MG 24 hr tablet TAKE 1 TABLET(30 MG) BY MOUTH DAILY  . Lancets (ONETOUCH DELICA PLUS [id number]) MISC USE AS DIRECTED UP TO FOUR TIMES A DAY  . ONETOUCH VERIO test strip USE AS DIRECTED TO TEST FOUR TIMES A DAY  . pilocarpine (PILOCAR) 4 % ophthalmic solution Place 1 drop into the right eye 3 (three) times daily.  . pneumococcal 13-valent conjugate vaccine (PREVNAR 13) SUSP injection Inject 0.5 mLs into the muscle tomorrow at  10 am for 1 dose.   No facility-administered encounter medications on file as of 03/03/2019.    Activities of Daily Living In your present state of health, do you have any difficulty performing the following activities: 03/03/2019 05/21/2018  Hearing? N N  Vision? Y N  Difficulty concentrating or making decisions? N N  Walking or climbing stairs? N Y  Dressing or bathing? N N  Doing errands, shopping? Y -  Comment someone's with him -  Preparing Food and eating ? Y -  Using the Toilet? N -  In the past six months, have you accidently leaked urine? Y -  Comment wears depends -  Do you have problems with loss of bowel control? N -  Managing your Medications? Y -  Comment daughter sets up -  Managing your Finances? N -  Housekeeping or managing your Housekeeping? N -  Some recent data might be hidden    Patient Care Team: [name], MD as PCP - General (Internal Medicine) Rex Kras, Claudette Stapler, RN as Case Manager [name] as Social Worker   Assessment:   This is a routine wellness examination for [facility].  Exercise Activities and Dietary recommendations Current Exercise Habits: The patient does not participate in regular exercise at present  Goals    . "Can you discuss Palliative Care with my dad"      Daughter stated Current Barriers:  [name] Knowledge Deficits related to advanced care via Palliative Care  Nurse Case Manager Clinical Goal(s):  [name] Over the next 30 days, patient will verbalize understanding of plan for Palliative Care referral   CCM RN CM Interventions:  02/24/19 call completed with daughter [name] and patient   . Inbound call received from daughter [name] for an update status regarding patient's Palliative Care referral  . Determined the referral for Palliative Care was placed to [facility] by Dr. Baird Cancer on 02/19/19 and has been authorized . Sent in basket message to [employer] NP Amy Olena Heckle requesting confirmation that this referral was received- response received per Amy, this referral has been processed and assigned to Palliative Care provider Gonzella Lex who will be contacting Mr. Senn to schedule his intake visit ASAP . Discussed plans with patient for ongoing care management follow up and provided patient with direct contact information for care management team  Patient Self Care Activities:  . Unable to independently perform self care  Please see past updates related to this goal by clicking on the "Past Updates" button in the selected goal      . "We are waiting to have labs rechecked to see if he is ready to start dialysis"     Current Barriers:  [name] Knowledge Deficits related to plan for start of Hemodialysis . Patient will need help with transportation and from Hemodialysis  Nurse Case Manager Clinical Goal(s):  [name] Over the next 60 days, patient will verbalize understanding of plan for start of outpatient hemodialysis  Goal Met . New - 01/28/19 Over the next 90 days, patient will continue to follow up with Nephrology to determine plan of care for start of Hemodialysis   CCM RN CM Interventions:  02/10/19 call completed with daughter [name] and patient   . Evaluation of current treatment plan related to ESRD and patient's adherence to plan as established by  provider . Discussed patient followed up with Dr. [name] Nephrologist on 02/05/19; Patient's creatinine is now up to 8.0, however patient remains to be asymptomatic of urosepsis, hyperkalemia or Pulmonary edema- discussed patient will  not start HD until he starts to "feel bad" per the patient . Education provided to Mr. Kotch and daughter [name], regarding the potential for Pulmonary edema and or Hyperkalemia - discussed these conditions warrant immediate medical assistance and can be life threatening - discussed Mr. Lewter has an emergency alert necklace for emergencies - Educated Mr. Gulley about the s/s suggestive of Pulmonary edema and or Hyperkalemia and discussed he would use his emergency alert necklace and or call his daughter immediately upon the start of symptoms . Discussed Mr. Soderquist has a left upper AVF that has never been cannulated but Dr. [name] feels is functional when needed . Discussed with daughter [name], the dialysis SW will assist with transportation for HD when needed . Collaboration with embedded [place] regarding follow up with daughter [name] to initiate assistance with transportation as it appears hemodialysis is in Mr. Derusha near future  Discussed plans with patient for ongoing care management follow up and provided patient with direct contact information for care management team  Patient Self Care Activities:  . Self administers medications as prescribed . Attends all scheduled provider appointments . Performs ADL's independently . Performs IADL's independently  Please see past updates related to this goal by clicking on the "Past Updates" button in the selected goal       . Assist with SCAT application to overcome transportation barriers for future dialysis treatments     Daughter Stated:  Current Barriers:  . Declining kidney function with anticipated need for dialysis in the future . Daughter provides current transportation for patient needs but unable to  commit to dialysis transportation due to work schedule . Impaired vision  Social Work Clinical Goal(s):  [name] Over the next 30 days, patient will work with SW to address concerns related to transportation  CCM SW Interventions: Completed 02/13/2019 . Received communication from RN Case Manager requesting SW involvement with transportation resources . Outbound call to the patients daughter and caregiver [name] who reports anticipation of dialysis treatment being needed in the future due to continued decline in kidney function . Educated [name] on [employer] . Initiated SCAT application on behalf of the patient . Rosamaria Lints once the application is completed by the patients primary provider SW would submit to SCAT to determine eligibility . Reviewed patient health care transportation benefit as an option to utilize as needed while SCAT application being processed . Scheduled follow up call over the next three weeks to assess progression of patient goal  Patient Self Care Activities:  . Attends all scheduled provider appointments . Calls provider office for new concerns or questions . Unable to independently provide transportation, relies on daughter for assistance  Initial goal documentation     . Patient Stated     03/03/2019, no goals       Fall Risk Fall Risk  03/03/2019 10/28/2018 02/19/2018  Falls in the past year? 0 0 0  Risk for fall due to : Impaired balance/gait;Impaired vision;Medication side effect - Impaired balance/gait;Impaired mobility  Risk for fall due to: Comment - - uses a walker  Follow up Falls evaluation completed;Education provided;Falls prevention discussed - -   Is the patient's home free of loose throw rugs in walkways, pet beds, electrical cords, etc?   yes      Grab bars in the bathroom? yes      Handrails on the stairs?   n/a      Adequate lighting?   yes  Timed Get Up and Go  Performed: n/a  Depression Screen PHQ 2/9 Scores  03/03/2019 10/28/2018 02/19/2018 10/22/2017  PHQ - 2 Score 0 0 0 0  PHQ- 9 Score 0 - 0 -    Cognitive Function     6CIT Screen 03/03/2019  What Year? 0 points  What month? 0 points  What time? 0 points  Count back from 20 0 points  Months in reverse 4 points  Repeat phrase 0 points  Total Score 4    Immunization History  Administered Date(s) Administered  . Influenza Whole 01/04/2000  . Influenza, High Dose Seasonal PF 10/28/2018  . Influenza-Unspecified 12/26/2017    Qualifies for Shingles Vaccine? yes  Screening Tests Health Maintenance  Topic Date Due  . FOOT EXAM  07/18/1950  . OPHTHALMOLOGY EXAM  [birth year]18/1950  . PNA vac Low Risk Adult (2 of 2 - PPSV23) 08/26/2013  . HEMOGLOBIN A1C  09/23/2018  . TETANUS/TDAP  11/01/2022  . INFLUENZA VACCINE  Completed   Cancer Screenings: Lung: Low Dose CT Chest recommended if Age 78-80 years, 30 pack-year currently smoking OR have quit w/in 15years. Patient does not qualify. Colorectal: not required  Additional Screenings:  Hepatitis C Screening:n/a      Plan:    Patient has no goals set at this time.  I have personally reviewed and noted the following in the patient's chart:   . Medical and social history . Use of alcohol, tobacco or illicit drugs  . Current medications and supplements . Functional ability and status . Nutritional status . Physical activity . Advanced directives . List of other physicians . Hospitalizations, surgeries, and ER visits in previous 12 months . Vitals . Screenings to include cognitive, depression, and falls . Referrals and appointments  In addition, I have reviewed and discussed with patient certain preventive protocols, quality metrics, and best practice recommendations. A written personalized care plan for preventive services as well as general preventive health recommendations were provided to patient.     Kellie Simmering, LPN  [id number]

## 2019-03-03 NOTE — Progress Notes (Signed)
This visit occurred during the SARS-CoV-2 public health emergency.  Safety protocols were in place, including screening questions prior to the visit, additional usage of staff PPE, and extensive cleaning of exam room while observing appropriate contact time as indicated for disinfecting solutions.  Subjective:     Patient ID: Elijah White , male    DOB: February 04, 1941 , 78 y.o.   MRN: [id number]   Chief Complaint  Patient presents with  . Annual Exam    HPI  His Creatinine up to 8 - asymptomatic, Dr. [name] is monitoring.  Seen last week down to 7.5, scheduled to see Dr. [name].  Palliative care with dialysis.  Planning to do SCAT.  His daughter makes his pill box.  His daughter is concerned about what exactly the patient is able to see.    He is seeing Dr. Daphine Deutscher - glaucoma specialist.    He is wearing a depends and will sometimes take off in the room. A concern is if he does not    Men's preventive visit. Patient Health Questionnaire (PHQ-2) is    Clinical Support from 03/03/2019 in [facility]  PHQ-2 Total Score  0     Patient is on a regular diet. Marital status: Widowed. Relevant history for alcohol use is:   Social History   Substance and Sexual Activity  Alcohol Use No   Relevant history for tobacco use is:  Social History   Tobacco Use  Smoking Status Former Smoker  . Packs/day: 0.50  . Quit date: 03/23/2018  . Years since quitting: 0.9  Smokeless Tobacco Never Used  . Past Medical History:  Diagnosis Date  . Anemia    low iron  . Arthritis   . Cancer [facility]) ?2006   prostate  . Chronic kidney disease    Stage 4  . Diabetes mellitus without complication ([place])   . Elevated cholesterol with high triglycerides   . GERD (gastroesophageal reflux disease)   . Hypertension   . Legally blind in left eye, as defined in [place]    has pinpoint vision in right eye     Family History  Problem Relation Age of Onset  . CAD Mother      Current  Outpatient Medications:  .  acetaminophen (TYLENOL) 500 MG tablet, Take 1,000 mg by mouth every 6 (six) hours as needed for headache (pain)., Disp: , Rfl:  .  atorvastatin (LIPITOR) 10 MG tablet, TAKE 1 TABLET BY MOUTH EVERY DAY, Disp: 90 tablet, Rfl: 0 .  BD PEN NEEDLE NANO U/F 32G X 4 MM MISC, USE SUBCUTANEOUS FOUR TIMES A DAY, Disp: , Rfl:  .  bimatoprost (LUMIGAN) 0.01 % SOLN, Place 1 drop into the right eye at bedtime., Disp: , Rfl:  .  blood glucose meter kit and supplies KIT, Dispense based on patient and insurance preference. Use up to four times daily as directed. (FOR ICD-9 250.00, 250.01). For QAC - HS accuchecks., Disp: 1 each, Rfl: 1 .  brimonidine (ALPHAGAN) 0.15 % ophthalmic solution, Place 1 drop into the right eye 3 (three) times daily. , Disp: , Rfl: 11 .  calcitRIOL (ROCALTROL) 0.5 MCG capsule, Take 0.5 mcg by mouth daily., Disp: , Rfl:  .  carvedilol (COREG) 3.125 MG tablet, TAKE 1 TABLET(3.125 MG) BY MOUTH TWICE DAILY WITH A MEAL, Disp: 180 tablet, Rfl: 1 .  dorzolamide-timolol (COSOPT) 22.3-6.8 MG/ML ophthalmic solution, Place 1 drop into the right eye 2 (two) times daily., Disp: , Rfl: 11 .  esomeprazole (  NEXIUM) 40 MG capsule, TAKE 1 CAPSULE BY MOUTH ONCE DAILY (Patient taking differently: Take 40 mg by mouth at bedtime. ), Disp: 30 capsule, Rfl: 0 .  furosemide (LASIX) 80 MG tablet, TAKE 1 TABLET BY MOUTH EVERY MORNING AND HALF TABLET BY MOUTH EVERY EVENING, Disp: 90 tablet, Rfl: 1 .  gabapentin (NEURONTIN) 100 MG capsule, Take 1 capsule (100 mg total) by mouth at bedtime., Disp: 90 capsule, Rfl: 1 .  hydrALAZINE (APRESOLINE) 25 MG tablet, TAKE 1 TABLET(25 MG) BY MOUTH TWICE DAILY, Disp: 180 tablet, Rfl: 1 .  isosorbide mononitrate (IMDUR) 30 MG 24 hr tablet, TAKE 1 TABLET(30 MG) BY MOUTH DAILY, Disp: 90 tablet, Rfl: 1 .  Lancets (ONETOUCH DELICA PLUS [id number]) MISC, USE AS DIRECTED UP TO FOUR TIMES A DAY, Disp: , Rfl:  .  ONETOUCH VERIO test strip, USE AS DIRECTED TO TEST  FOUR TIMES A DAY, Disp: 100 each, Rfl: 3 .  pilocarpine (PILOCAR) 4 % ophthalmic solution, Place 1 drop into the right eye 3 (three) times daily., Disp: , Rfl:  .  pneumococcal 13-valent conjugate vaccine (PREVNAR 13) SUSP injection, Inject 0.5 mLs into the muscle tomorrow at 10 am for 1 dose., Disp: 0.5 mL, Rfl: 0   No Known Allergies   Review of Systems  Constitutional: Negative.   HENT: Negative.        Legally blind   Eyes: Negative.   Cardiovascular: Negative.   Endocrine: Negative.   Genitourinary: Negative.   Musculoskeletal: Negative.   Neurological: Negative.   Hematological: Negative.   Psychiatric/Behavioral: Negative.      Today's Vitals   03/03/19 1429  BP: 138/62  Pulse: 76  Temp: 98.2 F (36.8 C)  TempSrc: Oral  Weight: 181 lb (82.1 kg)  Height: '5\' 4"'$  (1.626 m)  PainSc: 0-No pain   Body mass index is 31.07 kg/m.   Objective:  Physical Exam Vitals reviewed.  Constitutional:      General: He is not in acute distress.    Appearance: Normal appearance.  HENT:     Head: Normocephalic and atraumatic.  Eyes:     Extraocular Movements: Extraocular movements intact.     Conjunctiva/sclera: Conjunctivae normal.     Comments: Legally blind does not track well  Cardiovascular:     Rate and Rhythm: Normal rate and regular rhythm.     Pulses: Normal pulses.     Heart sounds: Normal heart sounds. No murmur.  Pulmonary:     Effort: Pulmonary effort is normal. No respiratory distress.     Breath sounds: Normal breath sounds.  Abdominal:     General: Bowel sounds are normal. There is no distension.     Palpations: Abdomen is soft.  Musculoskeletal:        General: No tenderness. Normal range of motion.     Comments: Uses cane  Skin:    General: Skin is warm and dry.  Neurological:     General: No focal deficit present.     Mental Status: He is alert and oriented to person, place, and time.  Psychiatric:        Mood and Affect: Mood normal.         Behavior: Behavior normal.        Thought Content: Thought content normal.        Judgment: Judgment normal.         Assessment And Plan:     1. Health maintenance examination . Behavior modifications discussed and diet history reviewed.   [name]  Pt will continue to exercise regularly and modify diet with low GI, plant based foods and decrease intake of processed foods.  . Recommend intake of daily multivitamin, Vitamin D, and calcium.  . Recommend for preventive screenings, as well as recommend immunizations that include influenza, TDAP  2. Chronic diastolic CHF (congestive heart failure) (HCC)  Stable, continue with follow up with cardiology  3. 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 Primary open angle glaucoma (POAG) of both eyes, severe stage  Chronic, continue follow up with specialist  Discussed importance of safety due to his vision loss and he has a life alert  I have sent a message to the Palliative care team to make them aware the patient would like an in person visit   Minette Brine, FNP    THE PATIENT IS ENCOURAGED TO PRACTICE SOCIAL DISTANCING DUE TO THE COVID-19 [place].

## 2019-03-03 NOTE — Patient Instructions (Signed)
Health Maintenance After Age 80 After age 80, you are at a higher risk for certain long-term diseases and infections as well as injuries from falls. Falls are a major cause of broken bones and head injuries in people who are older than age 80. Getting regular preventive care can help to keep you healthy and well. Preventive care includes getting regular testing and making lifestyle changes as recommended by your health care provider. Talk with your health care provider about:  Which screenings and tests you should have. A screening is a test that checks for a disease when you have no symptoms.  A diet and exercise plan that is right for you. What should I know about screenings and tests to prevent falls? Screening and testing are the best ways to find a health problem early. Early diagnosis and treatment give you the best chance of managing medical conditions that are common after age 80. Certain conditions and lifestyle choices may make you more likely to have a fall. Your health care provider may recommend:  Regular vision checks. Poor vision and conditions such as cataracts can make you more likely to have a fall. If you wear glasses, make sure to get your prescription updated if your vision changes.  Medicine review. Work with your health care provider to regularly review all of the medicines you are taking, including over-the-counter medicines. Ask your health care provider about any side effects that may make you more likely to have a fall. Tell your health care provider if any medicines that you take make you feel dizzy or sleepy.  Osteoporosis screening. Osteoporosis is a condition that causes the bones to get weaker. This can make the bones weak and cause them to break more easily.  Blood pressure screening. Blood pressure changes and medicines to control blood pressure can make you feel dizzy.  Strength and balance checks. Your health care provider may recommend certain tests to check your  strength and balance while standing, walking, or changing positions.  Foot health exam. Foot pain and numbness, as well as not wearing proper footwear, can make you more likely to have a fall.  Depression screening. You may be more likely to have a fall if you have a fear of falling, feel emotionally low, or feel unable to do activities that you used to do.  Alcohol use screening. Using too much alcohol can affect your balance and may make you more likely to have a fall. What actions can I take to lower my risk of falls? General instructions  Talk with your health care provider about your risks for falling. Tell your health care provider if: ? You fall. Be sure to tell your health care provider about all falls, even ones that seem minor. ? You feel dizzy, sleepy, or off-balance.  Take over-the-counter and prescription medicines only as told by your health care provider. These include any supplements.  Eat a healthy diet and maintain a healthy weight. A healthy diet includes low-fat dairy products, low-fat (lean) meats, and fiber from whole grains, beans, and lots of fruits and vegetables. Home safety  Remove any tripping hazards, such as rugs, cords, and clutter.  Install safety equipment such as grab bars in bathrooms and safety rails on stairs.  Keep rooms and walkways well-lit. Activity   Follow a regular exercise program to stay fit. This will help you maintain your balance. Ask your health care provider what types of exercise are appropriate for you.  If you need a cane or   walker, use it as recommended by your health care provider.  Wear supportive shoes that have nonskid soles. Lifestyle  Do not drink alcohol if your health care provider tells you not to drink.  If you drink alcohol, limit how much you have: ? 0-1 drink a day for women. ? 0-2 drinks a day for men.  Be aware of how much alcohol is in your drink. In the [place] one drink equals one typical bottle of beer (12  oz), one-half glass of wine (5 oz), or one shot of hard liquor (1 oz).  Do not use any products that contain nicotine or tobacco, such as cigarettes and e-cigarettes. If you need help quitting, ask your health care provider. Summary  Having a healthy lifestyle and getting preventive care can help to protect your health and wellness after age 80.  Screening and testing are the best way to find a health problem early and help you avoid having a fall. Early diagnosis and treatment give you the best chance for managing medical conditions that are more common for people who are older than age 80.  Falls are a major cause of broken bones and head injuries in people who are older than age 80. Take precautions to prevent a fall at home.  Work with your health care provider to learn what changes you can make to improve your health and wellness and to prevent falls. This information is not intended to replace advice given to you by your health care provider. Make sure you discuss any questions you have with your health care provider. Document Released: 01/02/2017 Document Revised: 06/12/2018 Document Reviewed: 01/02/2017 Elsevier Patient Education  2020 Elsevier Inc.  

## 2019-03-03 NOTE — Patient Instructions (Signed)
Elijah White , Thank you for taking time to come for your Medicare Wellness Visit. I appreciate your ongoing commitment to your health goals. Please review the following plan we discussed and let me know if I can assist you in the future.   Screening recommendations/referrals: Colonoscopy: not required Recommended yearly ophthalmology/optometry visit for glaucoma screening and checkup Recommended yearly dental visit for hygiene and checkup  Vaccinations: Influenza vaccine: 10/2018 Pneumococcal vaccine: sent to pharmacy Tdap vaccine: 10/2012 Shingles vaccine: discussed    Advanced directives: Please bring a copy of your POA (Power of [place]) and/or Living Will to your next appointment.    Conditions/risks identified: obesity  Next appointment:   Preventive Care 80 Years and Older, Male Preventive care refers to lifestyle choices and visits with your health care provider that can promote health and wellness. What does preventive care include?  A yearly physical exam. This is also called an annual well check.  Dental exams once or twice a year.  Routine eye exams. Ask your health care provider how often you should have your eyes checked.  Personal lifestyle choices, including:  Daily care of your teeth and gums.  Regular physical activity.  Eating a healthy diet.  Avoiding tobacco and drug use.  Limiting alcohol use.  Practicing safe sex.  Taking low doses of aspirin every day.  Taking vitamin and mineral supplements as recommended by your health care provider. What happens during an annual well check? The services and screenings done by your health care provider during your annual well check will depend on your age, overall health, lifestyle risk factors, and family history of disease. Counseling  Your health care provider may ask you questions about your:  Alcohol use.  Tobacco use.  Drug use.  Emotional well-being.  Home and relationship  well-being.  Sexual activity.  Eating habits.  History of falls.  Memory and ability to understand (cognition).  Work and work Statistician. Screening  You may have the following tests or measurements:  Height, weight, and BMI.  Blood pressure.  Lipid and cholesterol levels. These may be checked every 5 years, or more frequently if you are over 48 years old.  Skin check.  Lung cancer screening. You may have this screening every year starting at age 65 if you have a 30-pack-year history of smoking and currently smoke or have quit within the past 15 years.  Fecal occult blood test (FOBT) of the stool. You may have this test every year starting at age 76.  Flexible sigmoidoscopy or colonoscopy. You may have a sigmoidoscopy every 5 years or a colonoscopy every 10 years starting at age 68.  Prostate cancer screening. Recommendations will vary depending on your family history and other risks.  Hepatitis C blood test.  Hepatitis B blood test.  Sexually transmitted disease (STD) testing.  Diabetes screening. This is done by checking your blood sugar (glucose) after you have not eaten for a while (fasting). You may have this done every 1-3 years.  Abdominal aortic aneurysm (AAA) screening. You may need this if you are a current or former smoker.  Osteoporosis. You may be screened starting at age 46 if you are at high risk. Talk with your health care provider about your test results, treatment options, and if necessary, the need for more tests. Vaccines  Your health care provider may recommend certain vaccines, such as:  Influenza vaccine. This is recommended every year.  Tetanus, diphtheria, and acellular pertussis (Tdap, Td) vaccine. You may need a Td booster  every 10 years.  Zoster vaccine. You may need this after age 18.  Pneumococcal 13-valent conjugate (PCV13) vaccine. One dose is recommended after age 67.  Pneumococcal polysaccharide (PPSV23) vaccine. One dose is  recommended after age 15. Talk to your health care provider about which screenings and vaccines you need and how often you need them. This information is not intended to replace advice given to you by your health care provider. Make sure you discuss any questions you have with your health care provider. Document Released: 03/18/2015 Document Revised: 11/09/2015 Document Reviewed: 12/21/2014 Elsevier Interactive Patient Education  2017 [place] Prevention in the Home Falls can cause injuries. They can happen to people of all ages. There are many things you can do to make your home safe and to help prevent falls. What can I do on the outside of my home?  Regularly fix the edges of walkways and driveways and fix any cracks.  Remove anything that might make you trip as you walk through a door, such as a raised step or threshold.  Trim any bushes or trees on the path to your home.  Use bright outdoor lighting.  Clear any walking paths of anything that might make someone trip, such as rocks or tools.  Regularly check to see if handrails are loose or broken. Make sure that both sides of any steps have handrails.  Any raised decks and porches should have guardrails on the edges.  Have any leaves, snow, or ice cleared regularly.  Use sand or salt on walking paths during winter.  Clean up any spills in your garage right away. This includes oil or grease spills. What can I do in the bathroom?  Use night lights.  Install grab bars by the toilet and in the tub and shower. Do not use towel bars as grab bars.  Use non-skid mats or decals in the tub or shower.  If you need to sit down in the shower, use a plastic, non-slip stool.  Keep the floor dry. Clean up any water that spills on the floor as soon as it happens.  Remove soap buildup in the tub or shower regularly.  Attach bath mats securely with double-sided non-slip rug tape.  Do not have throw rugs and other things on  the floor that can make you trip. What can I do in the bedroom?  Use night lights.  Make sure that you have a light by your bed that is easy to reach.  Do not use any sheets or blankets that are too big for your bed. They should not hang down onto the floor.  Have a firm chair that has side arms. You can use this for support while you get dressed.  Do not have throw rugs and other things on the floor that can make you trip. What can I do in the kitchen?  Clean up any spills right away.  Avoid walking on wet floors.  Keep items that you use a lot in easy-to-reach places.  If you need to reach something above you, use a strong step stool that has a grab bar.  Keep electrical cords out of the way.  Do not use floor polish or wax that makes floors slippery. If you must use wax, use non-skid floor wax.  Do not have throw rugs and other things on the floor that can make you trip. What can I do with my stairs?  Do not leave any items on the stairs.  Make  sure that there are handrails on both sides of the stairs and use them. Fix handrails that are broken or loose. Make sure that handrails are as long as the stairways.  Check any carpeting to make sure that it is firmly attached to the stairs. Fix any carpet that is loose or worn.  Avoid having throw rugs at the top or bottom of the stairs. If you do have throw rugs, attach them to the floor with carpet tape.  Make sure that you have a light switch at the top of the stairs and the bottom of the stairs. If you do not have them, ask someone to add them for you. What else can I do to help prevent falls?  Wear shoes that:  Do not have high heels.  Have rubber bottoms.  Are comfortable and fit you well.  Are closed at the toe. Do not wear sandals.  If you use a stepladder:  Make sure that it is fully opened. Do not climb a closed stepladder.  Make sure that both sides of the stepladder are locked into place.  Ask someone to  hold it for you, if possible.  Clearly mark and make sure that you can see:  Any grab bars or handrails.  First and last steps.  Where the edge of each step is.  Use tools that help you move around (mobility aids) if they are needed. These include:  Canes.  Walkers.  Scooters.  Crutches.  Turn on the lights when you go into a dark area. Replace any light bulbs as soon as they burn out.  Set up your furniture so you have a clear path. Avoid moving your furniture around.  If any of your floors are uneven, fix them.  If there are any pets around you, be aware of where they are.  Review your medicines with your doctor. Some medicines can make you feel dizzy. This can increase your chance of falling. Ask your doctor what other things that you can do to help prevent falls. This information is not intended to replace advice given to you by your health care provider. Make sure you discuss any questions you have with your health care provider. Document Released: 12/16/2008 Document Revised: 07/28/2015 Document Reviewed: 03/26/2014 Elsevier Interactive Patient Education  2017 [employer].

## 2019-03-04 ENCOUNTER — Ambulatory Visit: Payer: Self-pay

## 2019-03-04 ENCOUNTER — Telehealth: Payer: Self-pay

## 2019-03-04 DIAGNOSIS — E1122 Type 2 diabetes mellitus with diabetic chronic kidney disease: Secondary | ICD-10-CM

## 2019-03-04 NOTE — Telephone Encounter (Signed)
Spoke with patient's daughter, [name], to offer to schedule a visit with Palliative Care NP. Verbal consent obtained. Visit scheduled for 03/10/2019 @ 3pm. Address confirmed.

## 2019-03-05 NOTE — Chronic Care Management (AMB) (Signed)
Chronic Care Management   Social Work Follow Up Note  03/04/2019 Name: Elijah White MRN: [id number] DOB: [birth year]  Elijah White is a 78 y.o. year old male who is a primary care patient of [name], MD. The CCM team was consulted for assistance with care coordination.   Review of patient status, including review of consultants reports, other relevant assessments, and collaboration with appropriate care team members and the patient's provider was performed as part of comprehensive patient evaluation and provision of chronic care management services.    SW placed an outbound call to the patients daughter to assist with care coordination needs.  Outpatient Encounter Medications as of 03/04/2019  Medication Sig  . acetaminophen (TYLENOL) 500 MG tablet Take 1,000 mg by mouth every 6 (six) hours as needed for headache (pain).  [name] atorvastatin (LIPITOR) 10 MG tablet TAKE 1 TABLET BY MOUTH EVERY DAY  . BD PEN NEEDLE NANO U/F 32G X 4 MM MISC USE SUBCUTANEOUS FOUR TIMES A DAY  . bimatoprost (LUMIGAN) 0.01 % SOLN Place 1 drop into the right eye at bedtime.  . blood glucose meter kit and supplies KIT Dispense based on patient and insurance preference. Use up to four times daily as directed. (FOR ICD-9 250.00, 250.01). For QAC - HS accuchecks.  . brimonidine (ALPHAGAN) 0.15 % ophthalmic solution Place 1 drop into the right eye 3 (three) times daily.   . calcitRIOL (ROCALTROL) 0.5 MCG capsule Take 0.5 mcg by mouth daily.  . carvedilol (COREG) 3.125 MG tablet TAKE 1 TABLET(3.125 MG) BY MOUTH TWICE DAILY WITH A MEAL  . dorzolamide-timolol (COSOPT) 22.3-6.8 MG/ML ophthalmic solution Place 1 drop into the right eye 2 (two) times daily.  [name] esomeprazole (NEXIUM) 40 MG capsule TAKE 1 CAPSULE BY MOUTH ONCE DAILY (Patient taking differently: Take 40 mg by mouth at bedtime. )  . furosemide (LASIX) 80 MG tablet TAKE 1 TABLET BY MOUTH EVERY MORNING AND HALF TABLET BY MOUTH EVERY EVENING  . gabapentin  (NEURONTIN) 100 MG capsule Take 1 capsule (100 mg total) by mouth at bedtime.  . hydrALAZINE (APRESOLINE) 25 MG tablet TAKE 1 TABLET(25 MG) BY MOUTH TWICE DAILY  . isosorbide mononitrate (IMDUR) 30 MG 24 hr tablet TAKE 1 TABLET(30 MG) BY MOUTH DAILY  . Lancets (ONETOUCH DELICA PLUS [id number]) MISC USE AS DIRECTED UP TO FOUR TIMES A DAY  . ONETOUCH VERIO test strip USE AS DIRECTED TO TEST FOUR TIMES A DAY  . pilocarpine (PILOCAR) 4 % ophthalmic solution Place 1 drop into the right eye 3 (three) times daily.   No facility-administered encounter medications on file as of 03/04/2019.     Goals Addressed            This Visit's Progress   . COMPLETED: Assist with SCAT application to overcome transportation barriers for future dialysis treatments       Daughter Stated:  Current Barriers:  . Declining kidney function with anticipated need for dialysis in the future . Daughter provides current transportation for patient needs but unable to commit to dialysis transportation due to work schedule . Impaired vision  Social Work Clinical Goal(s):  [name] Over the next 30 days, patient will work with SW to address concerns related to transportation  CCM SW Interventions: Completed 03/04/2019 . Received completed Part B SCAT application from provider . Collaboration with SCAT eligibility office to submit Part A and Part B SCAT application on behalf of the patient . Outbound call placed to the patients daughter Elijah White to update  on process and advise she would be contacted either by phone or mail over the next several weeks regarding patient eligibility to access service . Goal Met  Patient Self Care Activities:  . Attends all scheduled provider appointments . Calls provider office for new concerns or questions . Unable to independently provide transportation, relies on daughter for assistance  Please see past updates related to this goal by clicking on the "Past Updates" button in the selected goal            Follow Up Plan: No SW follow up planned at this time. The patient will remain active with CM RN Case Manager.  [name], BSW, CDP Social Worker, Certified Dementia Practitioner [facility] [phone]  Total time spent performing care coordination and/or care management activities with the patient by phone or face to face = 6 minutes.

## 2019-03-05 NOTE — Patient Instructions (Signed)
Social Worker Visit Information  Goals we discussed today:  Goals Addressed            This Visit's Progress   . COMPLETED: Assist with SCAT application to overcome transportation barriers for future dialysis treatments       Daughter Stated:  Current Barriers:  . Declining kidney function with anticipated need for dialysis in the future . Daughter provides current transportation for patient needs but unable to commit to dialysis transportation due to work schedule . Impaired vision  Social Work Clinical Goal(s):  [name] Over the next 30 days, patient will work with SW to address concerns related to transportation  CCM SW Interventions: Completed 03/04/2019 . Received completed Part B SCAT application from provider . Collaboration with SCAT eligibility office to submit Part A and Part B SCAT application on behalf of the patient . Outbound call placed to the patients daughter [name] to update on process and advise she would be contacted either by phone or mail over the next several weeks regarding patient eligibility to access service . Goal Met  Patient Self Care Activities:  . Attends all scheduled provider appointments . Calls provider office for new concerns or questions . Unable to independently provide transportation, relies on daughter for assistance  Please see past updates related to this goal by clicking on the "Past Updates" button in the selected goal          Follow Up Plan: No SW follow up planned at this time. Please contact me with future resource needs.   [name], BSW, CDP Social Worker, Certified Dementia Practitioner [facility] [phone]

## 2019-03-09 DIAGNOSIS — N189 Chronic kidney disease, unspecified: Secondary | ICD-10-CM | POA: Diagnosis not present

## 2019-03-09 DIAGNOSIS — D631 Anemia in chronic kidney disease: Secondary | ICD-10-CM | POA: Diagnosis not present

## 2019-03-09 DIAGNOSIS — N185 Chronic kidney disease, stage 5: Secondary | ICD-10-CM | POA: Diagnosis not present

## 2019-03-09 DIAGNOSIS — N139 Obstructive and reflux uropathy, unspecified: Secondary | ICD-10-CM | POA: Diagnosis not present

## 2019-03-09 DIAGNOSIS — N2581 Secondary hyperparathyroidism of renal origin: Secondary | ICD-10-CM | POA: Diagnosis not present

## 2019-03-09 DIAGNOSIS — I120 Hypertensive chronic kidney disease with stage 5 chronic kidney disease or end stage renal disease: Secondary | ICD-10-CM | POA: Diagnosis not present

## 2019-03-09 DIAGNOSIS — E118 Type 2 diabetes mellitus with unspecified complications: Secondary | ICD-10-CM | POA: Diagnosis not present

## 2019-03-10 ENCOUNTER — Other Ambulatory Visit: Payer: Medicare Other | Admitting: Internal Medicine

## 2019-03-10 ENCOUNTER — Other Ambulatory Visit: Payer: Self-pay

## 2019-03-13 ENCOUNTER — Other Ambulatory Visit: Payer: Self-pay

## 2019-03-13 ENCOUNTER — Ambulatory Visit: Payer: Self-pay

## 2019-03-13 ENCOUNTER — Telehealth: Payer: Self-pay

## 2019-03-13 DIAGNOSIS — E1122 Type 2 diabetes mellitus with diabetic chronic kidney disease: Secondary | ICD-10-CM

## 2019-03-13 DIAGNOSIS — I10 Essential (primary) hypertension: Secondary | ICD-10-CM

## 2019-03-13 DIAGNOSIS — I5032 Chronic diastolic (congestive) heart failure: Secondary | ICD-10-CM

## 2019-03-13 DIAGNOSIS — H401133 Primary open-angle glaucoma, bilateral, severe stage: Secondary | ICD-10-CM

## 2019-03-13 DIAGNOSIS — N185 Chronic kidney disease, stage 5: Secondary | ICD-10-CM

## 2019-03-13 DIAGNOSIS — N179 Acute kidney failure, unspecified: Secondary | ICD-10-CM

## 2019-03-16 ENCOUNTER — Telehealth: Payer: Self-pay

## 2019-03-16 NOTE — Patient Instructions (Addendum)
Visit Information  Goals Addressed    . "Can you discuss Palliative Care with my dad"       Daughter stated Current Barriers:  [name] Knowledge Deficits related to advanced care via Palliative Care . Chronic Disease Management support and education needs related to CHF, DMII, HTN, ESRD, Glaucoma  Nurse Case Manager Clinical Goal(s):  [name] Over the next 30 days, patient will verbalize understanding of plan for Palliative Care referral   CCM RN CM Interventions:  03/13/19 call completed with patient who asked to keep the call brief  . Determined Mr. Mccook and his daughter [name] completed a home visit on 03/10/19 with Lonia Chimera NP Gonzella Lex  . Determined Mr. Oelkers has no questions at this time and is appreciative of the service . Discussed plans with patient for ongoing care management follow up and provided patient with direct contact information for care management team  Patient Self Care Activities:  . Unable to independently perform self care  Please see past updates related to this goal by clicking on the "Past Updates" button in the selected goal         The patient verbalized understanding of instructions provided today and declined a print copy of patient instruction materials.   Telephone follow up appointment with care management team member scheduled for: 04/10/19  Barb Merino, RN, BSN, CCM Care Management Coordinator [facility]  Direct Phone: [phone]

## 2019-03-16 NOTE — Chronic Care Management (AMB) (Addendum)
Chronic Care Management   Follow Up Note   03/13/2019 Name: Elijah OROURKE MRN: [id number] DOB: [birth year]  Referred by: [name], MD Reason for referral : Chronic Care Management (CCM RNCM Telephone Follow up )   Elijah White is a 79 y.o. year old male who is a primary care patient of [name], MD. The CCM team was consulted for assistance with chronic disease management and care coordination needs.    Review of patient status, including review of consultants reports, relevant laboratory and other test results, and collaboration with appropriate care team members and the patient's provider was performed as part of comprehensive patient evaluation and provision of chronic care management services.    SDOH (Social Determinants of Health) screening performed today: None. See Care Plan for related entries.   Placed outbound call to patient to confirm Palliative Care services have been initiated.   Outpatient Encounter Medications as of 03/13/2019  Medication Sig  . acetaminophen (TYLENOL) 500 MG tablet Take 1,000 mg by mouth every 6 (six) hours as needed for headache (pain).  [name] atorvastatin (LIPITOR) 10 MG tablet TAKE 1 TABLET BY MOUTH EVERY DAY  . BD PEN NEEDLE NANO U/F 32G X 4 MM MISC USE SUBCUTANEOUS FOUR TIMES A DAY  . bimatoprost (LUMIGAN) 0.01 % SOLN Place 1 drop into the right eye at bedtime.  . blood glucose meter kit and supplies KIT Dispense based on patient and insurance preference. Use up to four times daily as directed. (FOR ICD-9 250.00, 250.01). For QAC - HS accuchecks.  . brimonidine (ALPHAGAN) 0.15 % ophthalmic solution Place 1 drop into the right eye 3 (three) times daily.   . calcitRIOL (ROCALTROL) 0.5 MCG capsule Take 0.5 mcg by mouth daily.  . carvedilol (COREG) 3.125 MG tablet TAKE 1 TABLET(3.125 MG) BY MOUTH TWICE DAILY WITH A MEAL  . dorzolamide-timolol (COSOPT) 22.3-6.8 MG/ML ophthalmic solution Place 1 drop into the right eye 2 (two) times daily.  [name]  esomeprazole (NEXIUM) 40 MG capsule TAKE 1 CAPSULE BY MOUTH ONCE DAILY (Patient taking differently: Take 40 mg by mouth at bedtime. )  . furosemide (LASIX) 80 MG tablet TAKE 1 TABLET BY MOUTH EVERY MORNING AND HALF TABLET BY MOUTH EVERY EVENING  . gabapentin (NEURONTIN) 100 MG capsule Take 1 capsule (100 mg total) by mouth at bedtime.  . hydrALAZINE (APRESOLINE) 25 MG tablet TAKE 1 TABLET(25 MG) BY MOUTH TWICE DAILY  . isosorbide mononitrate (IMDUR) 30 MG 24 hr tablet TAKE 1 TABLET(30 MG) BY MOUTH DAILY  . Lancets (ONETOUCH DELICA PLUS [id number]) MISC USE AS DIRECTED UP TO FOUR TIMES A DAY  . ONETOUCH VERIO test strip USE AS DIRECTED TO TEST FOUR TIMES A DAY  . pilocarpine (PILOCAR) 4 % ophthalmic solution Place 1 drop into the right eye 3 (three) times daily.   No facility-administered encounter medications on file as of 03/13/2019.     Goals Addressed    . "Can you discuss Palliative Care with my dad"       Daughter stated Current Barriers:  [name] Knowledge Deficits related to advanced care via Palliative Care . Chronic Disease Management support and education needs related to CHF, DMII, HTN, ESRD, Glaucoma  Nurse Case Manager Clinical Goal(s):  [name] Over the next 30 days, patient will verbalize understanding of plan for Palliative Care referral   CCM RN CM Interventions:  03/13/19 call completed with patient who asked to keep the call brief  . Determined Elijah White and his daughter Elijah White completed a  home visit on 03/10/19 with [facility] NP Elijah White  . Determined Elijah White has no questions at this time and is appreciative of the service . Discussed plans with patient for ongoing care management follow up and provided patient with direct contact information for care management team  Patient Self Care Activities:  . Unable to independently perform self care  Please see past updates related to this goal by clicking on the "Past Updates" button in the selected goal         Telephone  follow up appointment with care management team member scheduled for: 04/10/19   Barb Merino, RN, BSN, CCM Care Management Coordinator [place] [facility]  Direct Phone: [phone]

## 2019-03-22 ENCOUNTER — Other Ambulatory Visit: Payer: Self-pay | Admitting: Internal Medicine

## 2019-03-26 ENCOUNTER — Other Ambulatory Visit: Payer: Self-pay | Admitting: Internal Medicine

## 2019-04-03 ENCOUNTER — Other Ambulatory Visit: Payer: Self-pay | Admitting: Internal Medicine

## 2019-04-06 ENCOUNTER — Ambulatory Visit: Payer: Self-pay

## 2019-04-06 DIAGNOSIS — E1122 Type 2 diabetes mellitus with diabetic chronic kidney disease: Secondary | ICD-10-CM

## 2019-04-06 NOTE — Patient Instructions (Signed)
Social Worker Visit Information  Goals we discussed today:  Goals Addressed            This Visit's Progress   . Assist with care coordination of SCAT application to receive transportation services to and from MD appointments       Current Barriers:  . 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 Limited local support . Inability to drive self  Social Work Clinical Goal(s):  [name] Over the next 30 days the patient and his daughter will work with CM SW to identify patients eligibility to qualify for SCAT transportation snervices  CCM SW Interventions: . Inbound call received from the patients daughter [name] reporting the patient received a letter in the mail from [place] eligibility office indicating the patient application was incomplete . Rosamaria Lints SW would follow up to inquire missing information and assist with application completion . Collaboration with Almedia Balls to request follow up to clarify how SW can assist with application completion  Patient Self Care Activities:  . Attends all scheduled provider appointments . Calls pharmacy for medication refills . Calls provider office for new concerns or questions . Supportive daughter/caregiver who understands plan to work with SW regarding SCAT application approval  Initial goal documentation          Follow Up Plan: SW will follow up with patient by phone over the next 10 days   [name], BSW, CDP Social Worker, Certified Dementia Practitioner [place] [facility] [place] [facility] [phone]

## 2019-04-06 NOTE — Chronic Care Management (AMB) (Signed)
Chronic Care Management    Social Work Follow Up Note  04/06/2019 Name: Elijah White MRN: [id number] DOB: 11/30/1940  Elijah White is a 79 y.o. year old male who is a primary care patient of [name], MD. The CCM team was consulted for assistance with care coordination.   Review of patient status, including review of consultants reports, other relevant assessments, and collaboration with appropriate care team members and the patient's provider was performed as part of comprehensive patient evaluation and provision of chronic care management services.    SW received an inbound call from [facility] requesting assistance with care coordination.  Outpatient Encounter Medications as of 04/06/2019  Medication Sig  . acetaminophen (TYLENOL) 500 MG tablet Take 1,000 mg by mouth every 6 (six) hours as needed for headache (pain).  [name] atorvastatin (LIPITOR) 10 MG tablet TAKE 1 TABLET BY MOUTH EVERY DAY  . BD PEN NEEDLE NANO U/F 32G X 4 MM MISC USE SUBCUTANEOUS FOUR TIMES A DAY  . bimatoprost (LUMIGAN) 0.01 % SOLN Place 1 drop into the right eye at bedtime.  . blood glucose meter kit and supplies KIT Dispense based on patient and insurance preference. Use up to four times daily as directed. (FOR ICD-9 250.00, 250.01). For QAC - HS accuchecks.  . brimonidine (ALPHAGAN) 0.15 % ophthalmic solution Place 1 drop into the right eye 3 (three) times daily.   . calcitRIOL (ROCALTROL) 0.5 MCG capsule Take 0.5 mcg by mouth daily.  . carvedilol (COREG) 3.125 MG tablet TAKE 1 TABLET(3.125 MG) BY MOUTH TWICE DAILY WITH A MEAL  . dorzolamide-timolol (COSOPT) 22.3-6.8 MG/ML ophthalmic solution Place 1 drop into the right eye 2 (two) times daily.  [name] esomeprazole (NEXIUM) 40 MG capsule TAKE 1 CAPSULE BY MOUTH EVERY DAY  . furosemide (LASIX) 80 MG tablet TAKE 1 TABLET BY MOUTH EVERY MORNING AND HALF TABLET BY MOUTH EVERY EVENING  . gabapentin (NEURONTIN) 100 MG capsule Take 1 capsule (100 mg total) by mouth at  bedtime.  . hydrALAZINE (APRESOLINE) 25 MG tablet TAKE 1 TABLET(25 MG) BY MOUTH TWICE DAILY  . isosorbide mononitrate (IMDUR) 30 MG 24 hr tablet TAKE 1 TABLET(30 MG) BY MOUTH DAILY  . Lancets (ONETOUCH DELICA PLUS [id number]) MISC USE AS DIRECTED UP TO FOUR TIMES A DAY  . ONETOUCH VERIO test strip USE AS DIRECTED TO TEST FOUR TIMES A DAY  . pilocarpine (PILOCAR) 4 % ophthalmic solution Place 1 drop into the right eye 3 (three) times daily.   No facility-administered encounter medications on file as of 04/06/2019.     Goals Addressed            This Visit's Progress   . Assist with care coordination of SCAT application to receive transportation services to and from MD appointments       Current Barriers:  . 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 Limited local support . Inability to drive self  Social Work Clinical Goal(s):  [name] Over the next 30 days the patient and his daughter will work with CM SW to identify patients eligibility to qualify for SCAT transportation snervices  CCM SW Interventions: . Inbound call received from the patients daughter Elijah White reporting the patient received a letter in the mail from [place] eligibility office indicating the patient application was incomplete . Elijah White SW would follow up to inquire missing information and assist with application completion . Collaboration with Elijah White to request follow up to  clarify how SW can assist with application completion  Patient Self Care Activities:  . Attends all scheduled provider appointments . Calls pharmacy for medication refills . Calls provider office for new concerns or questions . Supportive daughter/caregiver who understands plan to work with SW regarding SCAT application approval  Initial goal documentation         Follow Up Plan: SW will follow up with patient by phone over the next 10 days upon receiving feedback from [place], [place], CDP Social Worker, Certified Dementia Practitioner [place] [facility] [place] [facility] [phone]  Total time spent performing care coordination and/or care management activities with the patient by phone or face to face = 7 minutes.

## 2019-04-06 NOTE — Telephone Encounter (Signed)
Gabapentin refill

## 2019-04-07 ENCOUNTER — Ambulatory Visit: Payer: Self-pay

## 2019-04-07 DIAGNOSIS — E118 Type 2 diabetes mellitus with unspecified complications: Secondary | ICD-10-CM | POA: Diagnosis not present

## 2019-04-07 DIAGNOSIS — N189 Chronic kidney disease, unspecified: Secondary | ICD-10-CM | POA: Diagnosis not present

## 2019-04-07 DIAGNOSIS — E1122 Type 2 diabetes mellitus with diabetic chronic kidney disease: Secondary | ICD-10-CM

## 2019-04-07 DIAGNOSIS — N139 Obstructive and reflux uropathy, unspecified: Secondary | ICD-10-CM | POA: Diagnosis not present

## 2019-04-07 DIAGNOSIS — N185 Chronic kidney disease, stage 5: Secondary | ICD-10-CM | POA: Diagnosis not present

## 2019-04-07 DIAGNOSIS — N2581 Secondary hyperparathyroidism of renal origin: Secondary | ICD-10-CM | POA: Diagnosis not present

## 2019-04-07 DIAGNOSIS — I120 Hypertensive chronic kidney disease with stage 5 chronic kidney disease or end stage renal disease: Secondary | ICD-10-CM | POA: Diagnosis not present

## 2019-04-07 DIAGNOSIS — D631 Anemia in chronic kidney disease: Secondary | ICD-10-CM | POA: Diagnosis not present

## 2019-04-07 DIAGNOSIS — I5032 Chronic diastolic (congestive) heart failure: Secondary | ICD-10-CM

## 2019-04-07 DIAGNOSIS — Z7689 Persons encountering health services in other specified circumstances: Secondary | ICD-10-CM | POA: Diagnosis not present

## 2019-04-07 NOTE — Patient Instructions (Signed)
Social Worker Visit Information  Goals we discussed today:  Goals Addressed            This Visit's Progress   . Assist with care coordination of SCAT application to receive transportation services to and from MD appointments   On track    Current Barriers:  . 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 Limited local support . Inability to drive self  Social Work Clinical Goal(s):  [name] Over the next 30 days the patient and his daughter will work with CM SW to identify patients eligibility to qualify for SCAT transportation snervices  CCM SW Interventions: Completed 04/07/19 . Communication received from Almedia Balls with SCAT eligibility office who reports the patients application was incomplete on Part B question #5 . Collaboration with patients primary provider requesting completion . SW will continue to follow and assist with application submission once provider completes question 5  Patient Self Care Activities:  . Attends all scheduled provider appointments . Calls pharmacy for medication refills . Calls provider office for new concerns or questions . Supportive daughter/caregiver who understands plan to work with SW regarding SCAT application approval  Please see past updates related to this goal by clicking on the "Past Updates" button in the selected goal         Follow Up Plan: SW will continue to follow   [name], BSW, CDP Social Worker, Certified Dementia Practitioner [place] / [place] [facility] [phone]

## 2019-04-07 NOTE — Chronic Care Management (AMB) (Signed)
Chronic Care Management    Social Work Follow Up Note  04/07/2019 Name: Elijah White MRN: [id number] DOB: May 30, 1940  Elijah White is a 79 y.o. year old male who is a primary care patient of [name], MD. The CCM team was consulted for assistance with care coordination.   Review of patient status, including review of consultants reports, other relevant assessments, and collaboration with appropriate care team members and the patient's provider was performed as part of comprehensive patient evaluation and provision of chronic care management services.    Outpatient Encounter Medications as of 04/07/2019  Medication Sig  . acetaminophen (TYLENOL) 500 MG tablet Take 1,000 mg by mouth every 6 (six) hours as needed for headache (pain).  [name] atorvastatin (LIPITOR) 10 MG tablet TAKE 1 TABLET BY MOUTH EVERY DAY  . BD PEN NEEDLE NANO U/F 32G X 4 MM MISC USE SUBCUTANEOUS FOUR TIMES A DAY  . bimatoprost (LUMIGAN) 0.01 % SOLN Place 1 drop into the right eye at bedtime.  . blood glucose meter kit and supplies KIT Dispense based on patient and insurance preference. Use up to four times daily as directed. (FOR ICD-9 250.00, 250.01). For QAC - HS accuchecks.  . brimonidine (ALPHAGAN) 0.15 % ophthalmic solution Place 1 drop into the right eye 3 (three) times daily.   . calcitRIOL (ROCALTROL) 0.5 MCG capsule Take 0.5 mcg by mouth daily.  . carvedilol (COREG) 3.125 MG tablet TAKE 1 TABLET(3.125 MG) BY MOUTH TWICE DAILY WITH A MEAL  . dorzolamide-timolol (COSOPT) 22.3-6.8 MG/ML ophthalmic solution Place 1 drop into the right eye 2 (two) times daily.  [name] esomeprazole (NEXIUM) 40 MG capsule TAKE 1 CAPSULE BY MOUTH EVERY DAY  . furosemide (LASIX) 80 MG tablet TAKE 1 TABLET BY MOUTH EVERY MORNING AND HALF TABLET BY MOUTH EVERY EVENING  . gabapentin (NEURONTIN) 100 MG capsule TAKE 1 CAPSULE(100 MG) BY MOUTH AT BEDTIME  . hydrALAZINE (APRESOLINE) 25 MG tablet TAKE 1 TABLET(25 MG) BY MOUTH TWICE DAILY  .  isosorbide mononitrate (IMDUR) 30 MG 24 hr tablet TAKE 1 TABLET(30 MG) BY MOUTH DAILY  . Lancets (ONETOUCH DELICA PLUS [id number]) MISC USE AS DIRECTED UP TO FOUR TIMES A DAY  . ONETOUCH VERIO test strip USE AS DIRECTED TO TEST FOUR TIMES A DAY  . pilocarpine (PILOCAR) 4 % ophthalmic solution Place 1 drop into the right eye 3 (three) times daily.   No facility-administered encounter medications on file as of 04/07/2019.     Goals Addressed            This Visit's Progress   . Assist with care coordination of SCAT application to receive transportation services to and from MD appointments   On track    Current Barriers:  . 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 Limited local support . Inability to drive self  Social Work Clinical Goal(s):  [name] Over the next 30 days the patient and his daughter will work with CM SW to identify patients eligibility to qualify for SCAT transportation snervices  CCM SW Interventions: Completed 04/07/19 . Communication received from Almedia Balls with SCAT eligibility office who reports the patients application was incomplete on Part B question #5 . Collaboration with patients primary provider requesting completion . SW will continue to follow and assist with application submission once provider completes question 5  Patient Self Care Activities:  . Attends all scheduled provider appointments . Calls pharmacy for medication refills . Calls provider office  for new concerns or questions . Supportive daughter/caregiver who understands plan to work with SW regarding SCAT application approval  Please see past updates related to this goal by clicking on the "Past Updates" button in the selected goal          Follow Up Plan: SW will follow up with the patient and his daughter over the next 10 days to assist with application process.   [name], BSW, CDP Social Worker, Certified Dementia Practitioner [place] /  [place] [facility] [phone]  Total time spent performing care coordination and/or care management activities with the patient by phone or face to face = 8 minutes.

## 2019-04-08 ENCOUNTER — Ambulatory Visit (INDEPENDENT_AMBULATORY_CARE_PROVIDER_SITE_OTHER): Payer: Medicare Other

## 2019-04-08 DIAGNOSIS — N185 Chronic kidney disease, stage 5: Secondary | ICD-10-CM

## 2019-04-08 DIAGNOSIS — E1122 Type 2 diabetes mellitus with diabetic chronic kidney disease: Secondary | ICD-10-CM

## 2019-04-08 DIAGNOSIS — I5032 Chronic diastolic (congestive) heart failure: Secondary | ICD-10-CM

## 2019-04-08 NOTE — Patient Instructions (Signed)
Social Worker Visit Information  Goals we discussed today:  Goals Addressed            This Visit's Progress   . Assist with care coordination of SCAT application to receive transportation services to and from MD appointments       Current Barriers:  . 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 Limited local support . Inability to drive self  Social Work Clinical Goal(s):  [name] Over the next 30 days the patient and his daughter will work with CM SW to identify patients eligibility to qualify for SCAT transportation snervices  CCM SW Interventions: Completed 04/08/19 . Received completed Part B SCAT application from primary care provider . Collaboration with [place] eligibility office to submit update application . Unsuccessful outbound call placed to [name] to provide an update on goal progression . Scheduled follow up call over the next three weeks to assess progression of patient goal  Patient Self Care Activities:  . Attends all scheduled provider appointments . Calls pharmacy for medication refills . Calls provider office for new concerns or questions . Supportive daughter/caregiver who understands plan to work with SW regarding SCAT application approval  Please see past updates related to this goal by clicking on the "Past Updates" button in the selected goal          Follow Up Plan: SW will follow up with patient by phone over the next three weeks.   [name], BSW, CDP Social Worker, Certified Dementia Practitioner [place] [facility] [place] [facility] [phone]

## 2019-04-08 NOTE — Chronic Care Management (AMB) (Signed)
Chronic Care Management    Social Work Follow Up Note  04/08/2019 Name: Elijah White MRN: [id number] DOB: February 26, 1941  Elijah White is a 79 y.o. year old male who is a primary care patient of [name], MD. The CCM team was consulted for assistance with care coordination.   Review of patient status, including review of consultants reports, other relevant assessments, and collaboration with appropriate care team members and the patient's provider was performed as part of comprehensive patient evaluation and provision of chronic care management services.    SW assisted with care coordination in relation to SCAT application process.  Outpatient Encounter Medications as of 04/08/2019  Medication Sig  . acetaminophen (TYLENOL) 500 MG tablet Take 1,000 mg by mouth every 6 (six) hours as needed for headache (pain).  [name] atorvastatin (LIPITOR) 10 MG tablet TAKE 1 TABLET BY MOUTH EVERY DAY  . BD PEN NEEDLE NANO U/F 32G X 4 MM MISC USE SUBCUTANEOUS FOUR TIMES A DAY  . bimatoprost (LUMIGAN) 0.01 % SOLN Place 1 drop into the right eye at bedtime.  . blood glucose meter kit and supplies KIT Dispense based on patient and insurance preference. Use up to four times daily as directed. (FOR ICD-9 250.00, 250.01). For QAC - HS accuchecks.  . brimonidine (ALPHAGAN) 0.15 % ophthalmic solution Place 1 drop into the right eye 3 (three) times daily.   . calcitRIOL (ROCALTROL) 0.5 MCG capsule Take 0.5 mcg by mouth daily.  . carvedilol (COREG) 3.125 MG tablet TAKE 1 TABLET(3.125 MG) BY MOUTH TWICE DAILY WITH A MEAL  . dorzolamide-timolol (COSOPT) 22.3-6.8 MG/ML ophthalmic solution Place 1 drop into the right eye 2 (two) times daily.  [name] esomeprazole (NEXIUM) 40 MG capsule TAKE 1 CAPSULE BY MOUTH EVERY DAY  . furosemide (LASIX) 80 MG tablet TAKE 1 TABLET BY MOUTH EVERY MORNING AND HALF TABLET BY MOUTH EVERY EVENING  . gabapentin (NEURONTIN) 100 MG capsule TAKE 1 CAPSULE(100 MG) BY MOUTH AT BEDTIME  . hydrALAZINE  (APRESOLINE) 25 MG tablet TAKE 1 TABLET(25 MG) BY MOUTH TWICE DAILY  . isosorbide mononitrate (IMDUR) 30 MG 24 hr tablet TAKE 1 TABLET(30 MG) BY MOUTH DAILY  . Lancets (ONETOUCH DELICA PLUS [id number]) MISC USE AS DIRECTED UP TO FOUR TIMES A DAY  . ONETOUCH VERIO test strip USE AS DIRECTED TO TEST FOUR TIMES A DAY  . pilocarpine (PILOCAR) 4 % ophthalmic solution Place 1 drop into the right eye 3 (three) times daily.   No facility-administered encounter medications on file as of 04/08/2019.     Goals Addressed            This Visit's Progress   . Assist with care coordination of SCAT application to receive transportation services to and from MD appointments       Current Barriers:  . 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 Limited local support . Inability to drive self  Social Work Clinical Goal(s):  [name] Over the next 30 days the patient and his daughter will work with CM SW to identify patients eligibility to qualify for SCAT transportation snervices  CCM SW Interventions: Completed 04/08/19 . Received completed Part B SCAT application from primary care provider . Collaboration with [place] eligibility office to submit update application . Unsuccessful outbound call placed to [name] to provide an update on goal progression . Scheduled follow up call over the next three weeks to assess progression of patient goal  Patient Self  Care Activities:  . Attends all scheduled provider appointments . Calls pharmacy for medication refills . Calls provider office for new concerns or questions . Supportive daughter/caregiver who understands plan to work with SW regarding SCAT application approval  Please see past updates related to this goal by clicking on the "Past Updates" button in the selected goal          Follow Up Plan: SW will follow up with patient by phone over the next three weeks.  [name], BSW, CDP Social  Worker, Certified Dementia Practitioner [place] / [place] [facility] [phone]  Total time spent performing care coordination and/or care management activities with the patient by phone or face to face = 10 minutes.

## 2019-04-09 ENCOUNTER — Ambulatory Visit: Payer: Self-pay

## 2019-04-09 DIAGNOSIS — E1122 Type 2 diabetes mellitus with diabetic chronic kidney disease: Secondary | ICD-10-CM

## 2019-04-09 NOTE — Patient Instructions (Signed)
Social Worker Visit Information  Goals we discussed today:  Goals Addressed            This Visit's Progress   . "We are waiting to have labs rechecked to see if he is ready to start dialysis"       Current Barriers:  [facility] Kitchen Knowledge Deficits related to plan for start of Hemodialysis . Patient will need help with transportation and from Hemodialysis  Nurse Case Manager Clinical Goal(s):  Elijah White Kitchen Over the next 60 days, patient will verbalize understanding of plan for start of outpatient hemodialysis  Goal Met . New - 01/28/19 Over the next 90 days, patient will continue to follow up with Nephrology to determine plan of care for start of Hemodialysis   CCM SW Interventions: Completed 04/09/19 with Elijah White . Voice message received from Elijah White indicating the patient would need to begin dialysis due to creatinine levels . Outbound call placed to Elijah White to review care coordination needs . Determined the patient was informed by Elijah White he would need to begin HD in the coming weeks  . Assessed for plan regarding transportation o Elijah White reports she is unable to assist with transportation needs due to work schedule but is fearful the patient will be successful using public transportation o Reviewed opportunity to utilize Elijah White transportation services o Discussed safety procedures followed by Elijah White to limit spread of COVID 19 o Determined Elijah White prefers the patient to be seen in the [place] [facility] due to location and would prefer an afternoon appointment due to patients vision limitations and difficulty seeing early morning due to lack of daylight - Discussed concern this may or may not be accommodated due to scheduling purposes . Unsuccessful outbound call placed to Elijah White, Elijah White assistant at [facility] [phone] ext: [phone] o Voice message left requesting call back to confirm patient seat location for collaboration with HD SW . Collaboration with RN Case Manager to provide  above information and plan  CCM RN CM Interventions:  02/10/19 call completed with daughter Elijah White and patient   . Evaluation of current treatment plan related to ESRD and patient's adherence to plan as established by provider . Discussed patient followed up with [facility] White Nephrologist on 02/05/19; Patient's creatinine is now up to 8.0, however patient remains to be asymptomatic of urosepsis, hyperkalemia or Pulmonary edema- discussed patient will not start HD until he starts to "feel bad" per the patient . Education provided to Elijah White and daughter Elijah White, regarding the potential for Pulmonary edema and or Hyperkalemia - discussed these conditions warrant immediate medical assistance and can be life threatening - discussed Elijah White has an emergency alert necklace for emergencies - Educated Elijah White about the s/s suggestive of Pulmonary edema and or Hyperkalemia and discussed he would use his emergency alert necklace and or call his daughter immediately upon the start of symptoms . Discussed Elijah White has a left upper AVF that has never been cannulated but Elijah White feels is functional when needed . Discussed with daughter Elijah White, the dialysis SW will assist with transportation for HD when needed . Collaboration with embedded Elijah White regarding follow up with daughter Elijah White to initiate assistance with transportation as it appears hemodialysis is in Elijah White near future  Discussed plans with patient for ongoing care management follow up and provided patient with direct contact information for care management team  Patient Self Care Activities:  . Self administers medications as prescribed . Attends all scheduled provider appointments .  Performs ADL's independently . Performs IADL's independently  Please see past updates related to this goal by clicking on the "Past Updates" button in the selected goal           Materials Provided: Verbal education about Elijah White transportation  provided by phone  Follow Up Plan: SW will follow up with patient by phone over the next week.   [name], BSW, CDP Social Worker, Certified Dementia Practitioner [place] [facility] [facility] [phone]

## 2019-04-09 NOTE — Chronic Care Management (AMB) (Signed)
Chronic Care Management    Social Work Follow Up Note  04/09/2019 Name: Elijah White MRN: [id number] DOB: [birth year]  Elijah White is a 79 y.o. year old male who is a primary care patient of Elijah Chard, MD. The CCM team was consulted for assistance with care coordination.   Review of patient status, including review of consultants reports, other relevant assessments, and collaboration with appropriate care team members and the patient's provider was performed as part of comprehensive patient evaluation and provision of chronic care management services.    SW placed an outbound call to the patients daughter and caregiver Elijah White in response to a voice message requesting assistance with care coordination needs.  Outpatient Encounter Medications as of 04/09/2019  Medication Sig  . acetaminophen (TYLENOL) 500 MG tablet Take 1,000 mg by mouth every 6 (six) hours as needed for headache (pain).  [name] atorvastatin (LIPITOR) 10 MG tablet TAKE 1 TABLET BY MOUTH EVERY DAY  . BD PEN NEEDLE NANO U/F 32G X 4 MM MISC USE SUBCUTANEOUS FOUR TIMES A DAY  . bimatoprost (LUMIGAN) 0.01 % SOLN Place 1 drop into the right eye at bedtime.  . blood glucose meter kit and supplies KIT Dispense based on patient and insurance preference. Use up to four times daily as directed. (FOR ICD-9 250.00, 250.01). For QAC - HS accuchecks.  . brimonidine (ALPHAGAN) 0.15 % ophthalmic solution Place 1 drop into the right eye 3 (three) times daily.   . calcitRIOL (ROCALTROL) 0.5 MCG capsule Take 0.5 mcg by mouth daily.  . carvedilol (COREG) 3.125 MG tablet TAKE 1 TABLET(3.125 MG) BY MOUTH TWICE DAILY WITH A MEAL  . dorzolamide-timolol (COSOPT) 22.3-6.8 MG/ML ophthalmic solution Place 1 drop into the right eye 2 (two) times daily.  [name] esomeprazole (NEXIUM) 40 MG capsule TAKE 1 CAPSULE BY MOUTH EVERY DAY  . furosemide (LASIX) 80 MG tablet TAKE 1 TABLET BY MOUTH EVERY MORNING AND HALF TABLET BY MOUTH EVERY EVENING  . gabapentin  (NEURONTIN) 100 MG capsule TAKE 1 CAPSULE(100 MG) BY MOUTH AT BEDTIME  . hydrALAZINE (APRESOLINE) 25 MG tablet TAKE 1 TABLET(25 MG) BY MOUTH TWICE DAILY  . isosorbide mononitrate (IMDUR) 30 MG 24 hr tablet TAKE 1 TABLET(30 MG) BY MOUTH DAILY  . Lancets (ONETOUCH DELICA PLUS [id number]) MISC USE AS DIRECTED UP TO FOUR TIMES A DAY  . ONETOUCH VERIO test strip USE AS DIRECTED TO TEST FOUR TIMES A DAY  . pilocarpine (PILOCAR) 4 % ophthalmic solution Place 1 drop into the right eye 3 (three) times daily.   No facility-administered encounter medications on file as of 04/09/2019.     Goals Addressed            This Visit's Progress   . "We are waiting to have labs rechecked to see if he is ready to start dialysis"       Current Barriers:  [name] Knowledge Deficits related to plan for start of Hemodialysis . Patient will need help with transportation and from Hemodialysis  Nurse Case Manager Clinical Goal(s):  [name] Over the next 60 days, patient will verbalize understanding of plan for start of outpatient hemodialysis  Goal Met . New - 01/28/19 Over the next 90 days, patient will continue to follow up with Elijah White to determine plan of care for start of Hemodialysis   CCM SW Interventions: Completed 04/09/19 with Elijah White . Voice message received from Elijah White indicating the patient would need to begin dialysis due to creatinine levels . Outbound call  placed to Elijah White to review care coordination needs . Determined the patient was informed by Elijah White he would need to begin HD in the coming weeks  . Assessed for plan regarding transportation o Elijah White reports she is unable to assist with transportation needs due to work schedule but is fearful the patient will be successful using public transportation o Reviewed opportunity to utilize [facility] transportation services o Discussed safety procedures followed by [facility] to limit spread of COVID 19 o Determined Elijah White prefers the patient to be seen in the  [place] dialysis center due to location and would prefer an afternoon appointment due to patients vision limitations and difficulty seeing early morning due to lack of daylight - Discussed concern this may or may not be accommodated due to scheduling purposes . Unsuccessful outbound call placed to [facility], Elijah White assistant at [facility] [phone] ext: [phone] o Voice message left requesting call back to confirm patient seat location for collaboration with HD SW . Collaboration with RN Case Manager to provide above information and plan  Patient Self Care Activities:  . Self administers medications as prescribed . Attends all scheduled provider appointments . Performs ADL's independently . Performs IADL's independently  Please see past updates related to this goal by clicking on the "Past Updates" button in the selected goal           Follow Up Plan: SW will follow up with the patient and his daughter over the next week.   [name], BSW, CDP Social Worker, Certified Dementia Practitioner [place] [facility] [place] [facility] [phone]  Total time spent performing care coordination and/or care management activities with the patient by phone or face to face = 16 minutes.

## 2019-04-10 ENCOUNTER — Telehealth: Payer: Self-pay

## 2019-04-10 ENCOUNTER — Ambulatory Visit: Payer: Self-pay

## 2019-04-10 DIAGNOSIS — N185 Chronic kidney disease, stage 5: Secondary | ICD-10-CM

## 2019-04-10 DIAGNOSIS — E1122 Type 2 diabetes mellitus with diabetic chronic kidney disease: Secondary | ICD-10-CM

## 2019-04-10 NOTE — Chronic Care Management (AMB) (Signed)
Chronic Care Management    Social Work Follow Up Note  04/10/2019 Name: Elijah White MRN: [id number] DOB: 07/08/1940  Elijah White is a 79 y.o. year old male who is a primary care patient of Elijah Chard, MD. The CCM team was consulted for assistance with care coordination.   Review of patient status, including review of consultants reports, other relevant assessments, and collaboration with appropriate care team members and the patient's provider was performed as part of comprehensive patient evaluation and provision of chronic care management services.    Outpatient Encounter Medications as of 04/10/2019  Medication Sig  . acetaminophen (TYLENOL) 500 MG tablet Take 1,000 mg by mouth every 6 (six) hours as needed for headache (pain).  [name] atorvastatin (LIPITOR) 10 MG tablet TAKE 1 TABLET BY MOUTH EVERY DAY  . BD PEN NEEDLE NANO U/F 32G X 4 MM MISC USE SUBCUTANEOUS FOUR TIMES A DAY  . bimatoprost (LUMIGAN) 0.01 % SOLN Place 1 drop into the right eye at bedtime.  . blood glucose meter kit and supplies KIT Dispense based on patient and insurance preference. Use up to four times daily as directed. (FOR ICD-9 250.00, 250.01). For QAC - HS accuchecks.  . brimonidine (ALPHAGAN) 0.15 % ophthalmic solution Place 1 drop into the right eye 3 (three) times daily.   . calcitRIOL (ROCALTROL) 0.5 MCG capsule Take 0.5 mcg by mouth daily.  . carvedilol (COREG) 3.125 MG tablet TAKE 1 TABLET(3.125 MG) BY MOUTH TWICE DAILY WITH A MEAL  . dorzolamide-timolol (COSOPT) 22.3-6.8 MG/ML ophthalmic solution Place 1 drop into the right eye 2 (two) times daily.  [name] esomeprazole (NEXIUM) 40 MG capsule TAKE 1 CAPSULE BY MOUTH EVERY DAY  . furosemide (LASIX) 80 MG tablet TAKE 1 TABLET BY MOUTH EVERY MORNING AND HALF TABLET BY MOUTH EVERY EVENING  . gabapentin (NEURONTIN) 100 MG capsule TAKE 1 CAPSULE(100 MG) BY MOUTH AT BEDTIME  . hydrALAZINE (APRESOLINE) 25 MG tablet TAKE 1 TABLET(25 MG) BY MOUTH TWICE DAILY  .  isosorbide mononitrate (IMDUR) 30 MG 24 hr tablet TAKE 1 TABLET(30 MG) BY MOUTH DAILY  . Lancets (ONETOUCH DELICA PLUS [id number]) MISC USE AS DIRECTED UP TO FOUR TIMES A DAY  . ONETOUCH VERIO test strip USE AS DIRECTED TO TEST FOUR TIMES A DAY  . pilocarpine (PILOCAR) 4 % ophthalmic solution Place 1 drop into the right eye 3 (three) times daily.   No facility-administered encounter medications on file as of 04/10/2019.     Goals Addressed            This Visit's Progress   . "We are waiting to have labs rechecked to see if he is ready to start dialysis"       Current Barriers:  [name] Knowledge Deficits related to plan for start of Hemodialysis . Patient will need help with transportation and from Hemodialysis  Nurse Case Manager Clinical Goal(s):  [name] Over the next 60 days, patient will verbalize understanding of plan for start of outpatient hemodialysis  Goal Met . New - 01/28/19 Over the next 90 days, patient will continue to follow up with Nephrology to determine plan of care for start of Hemodialysis   CCM SW Interventions: Completed 04/10/19 with Elijah White . Voice message received Elijah White with Dr. Jason White office who reports she has sent the patient HD referral to the [facility] White. Elijah White reports plans to ask for an afternoon seat time as requested by Elijah White contact information is [phone] .  Collaboration with RN Case Manager to provide updated information  CCM RN CM Interventions:  02/10/19 call completed with daughter Elijah White and patient   . Evaluation of current treatment plan related to ESRD and patient's adherence to plan as established by provider . Discussed patient followed up with Dr. [name] Nephrologist on 02/05/19; Patient's creatinine is now up to 8.0, however patient remains to be asymptomatic of urosepsis, hyperkalemia or Pulmonary edema- discussed patient will not start HD until he starts to "feel bad" per the patient . Education  provided to [name] and daughter Elijah White, regarding the potential for Pulmonary edema and or Hyperkalemia - discussed these conditions warrant immediate medical assistance and can be life threatening - discussed [name] has an emergency alert necklace for emergencies - Educated [name] about the s/s suggestive of Pulmonary edema and or Hyperkalemia and discussed he would use his emergency alert necklace and or call his daughter immediately upon the start of symptoms . Discussed [name] has a left upper AVF that has never been cannulated but Dr. [name] feels is functional when needed . Discussed with daughter Elijah White, the dialysis SW will assist with transportation for HD when needed . Collaboration with embedded [place] regarding follow up with daughter Elijah White to initiate assistance with transportation as it appears hemodialysis is in [name] near future  Discussed plans with patient for ongoing care management follow up and provided patient with direct contact information for care management team  Patient Self Care Activities:  . Self administers medications as prescribed . Attends all scheduled provider appointments . Performs ADL's independently . Performs IADL's independently  Please see past updates related to this goal by clicking on the "Past Updates" button in the selected goal           Follow Up Plan: SW will follow up with patient by phone over the next week  [name], BSW, CDP Social Worker, Certified Dementia Practitioner [place] [facility] [place] [facility] [phone]  Total time spent performing care coordination and/or care management activities with the patient by phone or face to face = 5 minutes.

## 2019-04-15 ENCOUNTER — Ambulatory Visit: Payer: Self-pay

## 2019-04-15 DIAGNOSIS — E1122 Type 2 diabetes mellitus with diabetic chronic kidney disease: Secondary | ICD-10-CM

## 2019-04-15 NOTE — Chronic Care Management (AMB) (Signed)
Chronic Care Management    Social Work Follow Up Note  04/15/2019 Name: LARONE KLIETHERMES MRN: [id number] DOB: 06-24-1940  MANOLITO JUREWICZ is a 79 y.o. year old male who is a primary care patient of [name], MD. The CCM team was consulted for assistance with care coordination.   Review of patient status, including review of consultants reports, other relevant assessments, and collaboration with appropriate care team members and the patient's provider was performed as part of comprehensive patient evaluation and provision of chronic care management services.    SW placed an outbound call to the patients daughter and caregiver, [name], to assist with care coordination.  Outpatient Encounter Medications as of 04/15/2019  Medication Sig  . acetaminophen (TYLENOL) 500 MG tablet Take 1,000 mg by mouth every 6 (six) hours as needed for headache (pain).  [name] atorvastatin (LIPITOR) 10 MG tablet TAKE 1 TABLET BY MOUTH EVERY DAY  . BD PEN NEEDLE NANO U/F 32G X 4 MM MISC USE SUBCUTANEOUS FOUR TIMES A DAY  . bimatoprost (LUMIGAN) 0.01 % SOLN Place 1 drop into the right eye at bedtime.  . blood glucose meter kit and supplies KIT Dispense based on patient and insurance preference. Use up to four times daily as directed. (FOR ICD-9 250.00, 250.01). For QAC - HS accuchecks.  . brimonidine (ALPHAGAN) 0.15 % ophthalmic solution Place 1 drop into the right eye 3 (three) times daily.   . calcitRIOL (ROCALTROL) 0.5 MCG capsule Take 0.5 mcg by mouth daily.  . carvedilol (COREG) 3.125 MG tablet TAKE 1 TABLET(3.125 MG) BY MOUTH TWICE DAILY WITH A MEAL  . dorzolamide-timolol (COSOPT) 22.3-6.8 MG/ML ophthalmic solution Place 1 drop into the right eye 2 (two) times daily.  [name] esomeprazole (NEXIUM) 40 MG capsule TAKE 1 CAPSULE BY MOUTH EVERY DAY  . furosemide (LASIX) 80 MG tablet TAKE 1 TABLET BY MOUTH EVERY MORNING AND HALF TABLET BY MOUTH EVERY EVENING  . gabapentin (NEURONTIN) 100 MG capsule TAKE 1 CAPSULE(100  MG) BY MOUTH AT BEDTIME  . hydrALAZINE (APRESOLINE) 25 MG tablet TAKE 1 TABLET(25 MG) BY MOUTH TWICE DAILY  . isosorbide mononitrate (IMDUR) 30 MG 24 hr tablet TAKE 1 TABLET(30 MG) BY MOUTH DAILY  . Lancets (ONETOUCH DELICA PLUS [id number]) MISC USE AS DIRECTED UP TO FOUR TIMES A DAY  . ONETOUCH VERIO test strip USE AS DIRECTED TO TEST FOUR TIMES A DAY  . pilocarpine (PILOCAR) 4 % ophthalmic solution Place 1 drop into the right eye 3 (three) times daily.   No facility-administered encounter medications on file as of 04/15/2019.     Goals Addressed            This Visit's Progress   . "We are waiting to have labs rechecked to see if he is ready to start dialysis"       Current Barriers:  [name] Knowledge Deficits related to plan for start of Hemodialysis . Patient will need help with transportation and from Hemodialysis  Nurse Case Manager Clinical Goal(s):  [name] Over the next 60 days, patient will verbalize understanding of plan for start of outpatient hemodialysis  Goal Met . New - 01/28/19 Over the next 90 days, patient will continue to follow up with Nephrology to determine plan of care for start of Hemodialysis Goal Met . New 04/15/19 Over the next 30 days the patient will collaborate with care management team while initiating HD treatments to create a better understanding of ongoing HD treatment plan.  CCM SW Interventions: Completed 04/15/19 with  [name] . Outbound call placed to [name] to determine HD plan . Patient plans to start HD on Thursday 04/16/19 at the [facility] center close to the patients home o Seat time is 12:40; patient instructed to arrive by 12:25 o Daughter plans to provide transportation at this time while she is able to incorporate into work schedule - Some barriers may arise with Saturday seat time and/or in the future if [name] is out of town - SW reviewed opportunity to utilize patient health plan transportation benefit or [facility] services. Provided information  for both via e-mail as requested o Collaboration with RN Case Manager to provide an update on plans to start HD . Scheduled follow up call over the next two weeks to assist with care coordination needs  Patient Self Care Activities:  . Self administers medications as prescribed . Attends all scheduled provider appointments . Performs ADL's independently . Performs IADL's independently  Please see past updates related to this goal by clicking on the "Past Updates" button in the selected goal       . COMPLETED: Assist with care coordination of SCAT application to receive transportation services to and from MD appointments       Current Barriers:  . 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 Limited local support . Inability to drive self  Social Work Clinical Goal(s):  [name] Over the next 30 days the patient and his daughter will work with CM SW to identify patients eligibility to qualify for SCAT transportation snervices  CCM SW Interventions: Completed 04/15/19 . Collaboration with Almedia Balls who confirms patients eligibility to access SCAT services . Outbound call to the patients daughter and caregiver [name] to inform of SCAT eligibility . Provided information on SCAT transportation services via e-mail correspondence as requested by [name] . Goal Met  Patient Self Care Activities:  . Attends all scheduled provider appointments . Calls pharmacy for medication refills . Calls provider office for new concerns or questions . Supportive daughter/caregiver who understands plan to work with SW regarding SCAT application approval  Please see past updates related to this goal by clicking on the "Past Updates" button in the selected goal          Follow Up Plan: SW will follow up with patient by phone over the next two weeks.   [name], BSW, CDP Social Worker, Certified Dementia Practitioner [place] [facility] [place]  [facility] [phone]  Total time spent performing care coordination and/or care management activities with the patient by phone or face to face = 14 minutes.

## 2019-04-15 NOTE — Patient Instructions (Signed)
Social Worker Visit Information  Goals we discussed today:  Goals Addressed            This Visit's Progress   . "We are waiting to have labs rechecked to see if he is ready to start dialysis"       Current Barriers:  [name] Knowledge Deficits related to plan for start of Hemodialysis . Patient will need help with transportation and from Hemodialysis  Nurse Case Manager Clinical Goal(s):  [name] Over the next 60 days, patient will verbalize understanding of plan for start of outpatient hemodialysis  Goal Met . New - 01/28/19 Over the next 90 days, patient will continue to follow up with Nephrology to determine plan of care for start of Hemodialysis Goal Met . New 04/15/19 Over the next 30 days the patient will collaborate with care management team while initiating HD treatments to create a better understanding of ongoing HD treatment plan.  CCM SW Interventions: Completed 04/15/19 with [name] . Outbound call placed to [name] to determine HD plan . Patient plans to start HD on Thursday 04/16/19 at the [facility] center close to the patients home o Seat time is 12:40; patient instructed to arrive by 12:25 o Daughter plans to provide transportation at this time while she is able to incorporate into work schedule - Some barriers may arise with Saturday seat time and/or in the future if [name] is out of town - SW reviewed opportunity to utilize patient health plan transportation benefit or SCAT services. Provided information for both via e-mail as requested o Collaboration with RN Case Manager to provide an update on plans to start HD . Scheduled follow up call over the next two weeks to assist with care coordination needs  CCM RN CM Interventions:  02/10/19 call completed with daughter [name] and patient   . Evaluation of current treatment plan related to ESRD and patient's adherence to plan as established by provider . Discussed patient followed up with Dr. [name] Nephrologist on 02/05/19;  Patient's creatinine is now up to 8.0, however patient remains to be asymptomatic of urosepsis, hyperkalemia or Pulmonary edema- discussed patient will not start HD until he starts to "feel bad" per the patient . Education provided to Mr. Anding and daughter [name], regarding the potential for Pulmonary edema and or Hyperkalemia - discussed these conditions warrant immediate medical assistance and can be life threatening - discussed Mr. Rihn has an emergency alert necklace for emergencies - Educated Mr. Steve about the s/s suggestive of Pulmonary edema and or Hyperkalemia and discussed he would use his emergency alert necklace and or call his daughter immediately upon the start of symptoms . Discussed Mr. 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 [name] feels is functional when needed . Discussed with daughter [name], the dialysis SW will assist with transportation for HD when needed . Collaboration with embedded Moran regarding follow up with daughter [name] to initiate assistance with transportation as it appears hemodialysis is in Mr. Riolo near future  Discussed plans with patient for ongoing care management follow up and provided patient with direct contact information for care management team  Patient Self Care Activities:  . Self administers medications as prescribed . Attends all scheduled provider appointments . Performs ADL's independently . Performs IADL's independently  Please see past updates related to this goal by clicking on the "Past Updates" button in the selected goal       . COMPLETED: Assist with care coordination  of SCAT application to receive transportation services to and from MD appointments       Current Barriers:  . 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 Limited local support . 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 Collaboration with Almedia Balls who confirms patients eligibility to access SCAT services . Outbound call to the patients daughter and caregiver [name] to inform of SCAT eligibility . Provided information on SCAT transportation services via e-mail correspondence as requested by [name] . Goal Met  Patient Self Care Activities:  . Attends all scheduled provider appointments . Calls pharmacy for medication refills . Calls provider office for new concerns or questions . Supportive daughter/caregiver who understands plan to work with SW regarding SCAT application approval  Please see past updates related to this goal by clicking on the "Past Updates" button in the selected goal          Materials Provided: Verbal education about SCAT transportation services provided by phone  Follow Up Plan: SW will follow up with patient by phone over the next two weeks  [name], BSW, CDP Social Worker, Certified Dementia Practitioner [place] [facility] [place] [facility] [phone]

## 2019-04-16 DIAGNOSIS — N2581 Secondary hyperparathyroidism of renal origin: Secondary | ICD-10-CM | POA: Diagnosis not present

## 2019-04-16 DIAGNOSIS — D631 Anemia in chronic kidney disease: Secondary | ICD-10-CM | POA: Insufficient documentation

## 2019-04-16 DIAGNOSIS — L299 Pruritus, unspecified: Secondary | ICD-10-CM | POA: Insufficient documentation

## 2019-04-16 DIAGNOSIS — R52 Pain, unspecified: Secondary | ICD-10-CM | POA: Insufficient documentation

## 2019-04-16 DIAGNOSIS — H409 Unspecified glaucoma: Secondary | ICD-10-CM | POA: Insufficient documentation

## 2019-04-16 DIAGNOSIS — Z992 Dependence on renal dialysis: Secondary | ICD-10-CM | POA: Insufficient documentation

## 2019-04-16 DIAGNOSIS — R600 Localized edema: Secondary | ICD-10-CM | POA: Insufficient documentation

## 2019-04-16 DIAGNOSIS — I129 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 kidney disease, or unspecified chronic kidney disease: Secondary | ICD-10-CM | POA: Insufficient documentation

## 2019-04-16 DIAGNOSIS — R32 Unspecified urinary incontinence: Secondary | ICD-10-CM | POA: Insufficient documentation

## 2019-04-16 DIAGNOSIS — Z23 Encounter for immunization: Secondary | ICD-10-CM | POA: Diagnosis not present

## 2019-04-16 DIAGNOSIS — R0602 Shortness of breath: Secondary | ICD-10-CM | POA: Insufficient documentation

## 2019-04-16 DIAGNOSIS — E118 Type 2 diabetes mellitus with unspecified complications: Secondary | ICD-10-CM | POA: Diagnosis not present

## 2019-04-16 DIAGNOSIS — F17200 Nicotine dependence, unspecified, uncomplicated: Secondary | ICD-10-CM | POA: Insufficient documentation

## 2019-04-16 DIAGNOSIS — C61 Malignant neoplasm of prostate: Secondary | ICD-10-CM | POA: Insufficient documentation

## 2019-04-16 DIAGNOSIS — N186 End stage renal disease: Secondary | ICD-10-CM | POA: Diagnosis not present

## 2019-04-17 DIAGNOSIS — Z515 Encounter for palliative care: Secondary | ICD-10-CM | POA: Insufficient documentation

## 2019-04-17 DIAGNOSIS — Z23 Encounter for immunization: Secondary | ICD-10-CM | POA: Insufficient documentation

## 2019-04-18 ENCOUNTER — Other Ambulatory Visit: Payer: Self-pay | Admitting: Internal Medicine

## 2019-04-18 ENCOUNTER — Other Ambulatory Visit: Payer: Self-pay | Admitting: Nurse Practitioner

## 2019-04-18 DIAGNOSIS — N186 End stage renal disease: Secondary | ICD-10-CM | POA: Diagnosis not present

## 2019-04-18 DIAGNOSIS — E118 Type 2 diabetes mellitus with unspecified complications: Secondary | ICD-10-CM | POA: Diagnosis not present

## 2019-04-18 DIAGNOSIS — Z992 Dependence on renal dialysis: Secondary | ICD-10-CM | POA: Diagnosis not present

## 2019-04-18 DIAGNOSIS — N2581 Secondary hyperparathyroidism of renal origin: Secondary | ICD-10-CM | POA: Diagnosis not present

## 2019-04-18 DIAGNOSIS — Z23 Encounter for immunization: Secondary | ICD-10-CM | POA: Diagnosis not present

## 2019-04-21 DIAGNOSIS — Z23 Encounter for immunization: Secondary | ICD-10-CM | POA: Diagnosis not present

## 2019-04-21 DIAGNOSIS — Z992 Dependence on renal dialysis: Secondary | ICD-10-CM | POA: Diagnosis not present

## 2019-04-21 DIAGNOSIS — E118 Type 2 diabetes mellitus with unspecified complications: Secondary | ICD-10-CM | POA: Diagnosis not present

## 2019-04-21 DIAGNOSIS — N186 End stage renal disease: Secondary | ICD-10-CM | POA: Diagnosis not present

## 2019-04-21 DIAGNOSIS — N2581 Secondary hyperparathyroidism of renal origin: Secondary | ICD-10-CM | POA: Diagnosis not present

## 2019-04-22 ENCOUNTER — Telehealth: Payer: Self-pay

## 2019-04-23 ENCOUNTER — Telehealth: Payer: Self-pay | Admitting: Internal Medicine

## 2019-04-23 DIAGNOSIS — E118 Type 2 diabetes mellitus with unspecified complications: Secondary | ICD-10-CM | POA: Diagnosis not present

## 2019-04-23 DIAGNOSIS — N186 End stage renal disease: Secondary | ICD-10-CM | POA: Diagnosis not present

## 2019-04-23 DIAGNOSIS — Z23 Encounter for immunization: Secondary | ICD-10-CM | POA: Diagnosis not present

## 2019-04-23 DIAGNOSIS — N2581 Secondary hyperparathyroidism of renal origin: Secondary | ICD-10-CM | POA: Diagnosis not present

## 2019-04-23 DIAGNOSIS — Z515 Encounter for palliative care: Secondary | ICD-10-CM

## 2019-04-23 DIAGNOSIS — Z992 Dependence on renal dialysis: Secondary | ICD-10-CM | POA: Diagnosis not present

## 2019-04-23 NOTE — Telephone Encounter (Signed)
Phoned daughter/HCPOA [name] in reference to previously scheduled Palliative appointment and due to inclement weather.  Daughter states that patient began hemodialysis treatments last week which are scheduled on T, Th, Sat.  She would like to reschedule today's appointment to a later date.  She reports that he has less of an appetite and increased fatigue since beginning dialysis.  Encouraged to continue to offer small, frequent meals and snacks. We will communicate next week to set an alternate date.  Support given. Gonzella Lex, NP-C

## 2019-04-24 ENCOUNTER — Ambulatory Visit: Payer: Self-pay

## 2019-04-24 ENCOUNTER — Other Ambulatory Visit: Payer: Self-pay

## 2019-04-24 ENCOUNTER — Telehealth: Payer: Self-pay

## 2019-04-24 DIAGNOSIS — H401133 Primary open-angle glaucoma, bilateral, severe stage: Secondary | ICD-10-CM

## 2019-04-24 DIAGNOSIS — N179 Acute kidney failure, unspecified: Secondary | ICD-10-CM

## 2019-04-24 DIAGNOSIS — E1122 Type 2 diabetes mellitus with diabetic chronic kidney disease: Secondary | ICD-10-CM | POA: Diagnosis not present

## 2019-04-24 DIAGNOSIS — I10 Essential (primary) hypertension: Secondary | ICD-10-CM | POA: Diagnosis not present

## 2019-04-24 DIAGNOSIS — N185 Chronic kidney disease, stage 5: Secondary | ICD-10-CM | POA: Diagnosis not present

## 2019-04-24 DIAGNOSIS — I5032 Chronic diastolic (congestive) heart failure: Secondary | ICD-10-CM

## 2019-04-25 ENCOUNTER — Other Ambulatory Visit: Payer: Self-pay | Admitting: Nurse Practitioner

## 2019-04-25 DIAGNOSIS — Z992 Dependence on renal dialysis: Secondary | ICD-10-CM | POA: Diagnosis not present

## 2019-04-25 DIAGNOSIS — Z23 Encounter for immunization: Secondary | ICD-10-CM | POA: Diagnosis not present

## 2019-04-25 DIAGNOSIS — N186 End stage renal disease: Secondary | ICD-10-CM | POA: Diagnosis not present

## 2019-04-25 DIAGNOSIS — E118 Type 2 diabetes mellitus with unspecified complications: Secondary | ICD-10-CM | POA: Diagnosis not present

## 2019-04-25 DIAGNOSIS — N2581 Secondary hyperparathyroidism of renal origin: Secondary | ICD-10-CM | POA: Diagnosis not present

## 2019-04-27 ENCOUNTER — Telehealth: Payer: Self-pay

## 2019-04-27 ENCOUNTER — Other Ambulatory Visit: Payer: Self-pay

## 2019-04-27 NOTE — Patient Instructions (Addendum)
Visit Information  Goals Addressed    . COMPLETED: "Can you discuss Palliative Care with my dad"       Daughter stated Current Barriers:  [name] Knowledge Deficits related to advanced care via Palliative Care . Chronic Disease Management support and education needs related to CHF, DMII, HTN, ESRD, Glaucoma  Nurse Case Manager Clinical Goal(s):  [name] Over the next 30 days, patient will verbalize understanding of plan for Palliative Care referral   Goal Met  CCM RN CM Interventions:  04/27/19 call completed with daughter [name]  . Determined patient is enrolled with [facility] assigned to Gonzella Lex, NP  Patient Self Care Activities:  . Unable to independently perform self care  Please see past updates related to this goal by clicking on the "Past Updates" button in the selected goal      . "We are waiting to have labs rechecked to see if he is ready to start dialysis"       Current Barriers:  [name] Knowledge Deficits related to plan for start of Hemodialysis . Patient will need help with transportation and from Hemodialysis  Nurse Case Manager Clinical Goal(s):  [name] Over the next 60 days, patient will verbalize understanding of plan for start of outpatient hemodialysis  Goal Met . New - 01/28/19 Over the next 90 days, patient will continue to follow up with Nephrology to determine plan of care for start of Hemodialysis Goal Met . New 04/15/19 Over the next 30 days the patient will collaborate with care management team while initiating HD treatments to create a better understanding of ongoing HD treatment plan  CCM SW Interventions: Completed 04/15/19 with [name] . Outbound call placed to [name] to determine HD plan . Patient plans to start HD on Thursday 04/16/19 at the [facility] center close to the patients home o Seat time is 12:40; patient instructed to arrive by 12:25 o Daughter plans to provide transportation at this time while she is able to incorporate into  work schedule - Some barriers may arise with Saturday seat time and/or in the future if [name] is out of town - SW reviewed opportunity to utilize patient health plan transportation benefit or SCAT services. Provided information for both via e-mail as requested o Collaboration with RN Case Manager to provide an update on plans to start HD . Scheduled follow up call over the next two weeks to assist with care coordination needs  CCM RN CM Interventions:  04/27/19 call completed with daughter [name] . Evaluation of current treatment plan related to ESRD & initiation of Hemodialysis and patient's adherence to plan as established by provider . Determined Mr. Stoffers started HD at the [facility] on 04/17/19; daughter advised he is doing well with treatments thus far, although exhausted after each treatment . Reviewed EMR to confirm patient has received his Pneumonia vaccine at PCP office, discussed this immunization is not listed in the EMR and or most recent PCP OV note . Discussed patient did receive his 1st COVID vaccine, Moderna on 04/17/19 via the [facility], updated EMR . Discussed daughter needs to keep this call brief but plans to give me a call back later today   Discussed plans with patient for ongoing care management follow up and provided patient with direct contact information for care management team  Patient Self Care Activities:  . Self administers medications as prescribed . Attends all scheduled provider appointments . Performs ADL's independently . Performs IADL's independently  Please see past  updates related to this goal by clicking on the "Past Updates" button in the selected goal        Patient verbalizes understanding of instructions provided today.   Telephone follow up appointment with care management team member scheduled for:  06/08/19 Barb Merino, RN, BSN, CCM Care Management Coordinator [place] [facility]  Direct  Phone: [phone]

## 2019-04-27 NOTE — Chronic Care Management (AMB) (Signed)
Chronic Care Management   Follow Up Note   04/27/2019 Name: LARK RUNK MRN: [id number] DOB: [birth year]  Referred by: [name], MD Reason for referral : Chronic Care Management (FU Call - ESRD, new start HD)   KAZIM CORRALES is a 79 y.o. year old male who is a primary care patient of [name], MD. The CCM team was consulted for assistance with chronic disease management and care coordination needs.    Review of patient status, including review of consultants reports, relevant laboratory and other test results, and collaboration with appropriate care team members and the patient's provider was performed as part of comprehensive patient evaluation and provision of chronic care management services.    SDOH (Social Determinants of Health) assessments performed: No   Placed outbound call to daughter [name] for a CCM RN CM update.     Outpatient Encounter Medications as of 04/24/2019  Medication Sig  . acetaminophen (TYLENOL) 500 MG tablet Take 1,000 mg by mouth every 6 (six) hours as needed for headache (pain).  [name] atorvastatin (LIPITOR) 10 MG tablet TAKE 1 TABLET BY MOUTH EVERY DAY  . BD PEN NEEDLE NANO U/F 32G X 4 MM MISC USE SUBCUTANEOUS FOUR TIMES A DAY  . bimatoprost (LUMIGAN) 0.01 % SOLN Place 1 drop into the right eye at bedtime.  . blood glucose meter kit and supplies KIT Dispense based on patient and insurance preference. Use up to four times daily as directed. (FOR ICD-9 250.00, 250.01). For QAC - HS accuchecks.  . brimonidine (ALPHAGAN) 0.15 % ophthalmic solution Place 1 drop into the right eye 3 (three) times daily.   . calcitRIOL (ROCALTROL) 0.5 MCG capsule Take 0.5 mcg by mouth daily.  . carvedilol (COREG) 3.125 MG tablet TAKE 1 TABLET(3.125 MG) BY MOUTH TWICE DAILY WITH A MEAL  . dorzolamide-timolol (COSOPT) 22.3-6.8 MG/ML ophthalmic solution Place 1 drop into the right eye 2 (two) times daily.  [name] esomeprazole (NEXIUM) 40 MG capsule TAKE 1 CAPSULE BY  MOUTH EVERY DAY  . furosemide (LASIX) 80 MG tablet TAKE 1 TABLET BY MOUTH EVERY MORNING AND HALF TABLET BY MOUTH EVERY EVENING  . gabapentin (NEURONTIN) 100 MG capsule TAKE 1 CAPSULE(100 MG) BY MOUTH AT BEDTIME  . hydrALAZINE (APRESOLINE) 25 MG tablet TAKE 1 TABLET(25 MG) BY MOUTH TWICE DAILY  . isosorbide mononitrate (IMDUR) 30 MG 24 hr tablet TAKE 1 TABLET(30 MG) BY MOUTH DAILY  . Lancets (ONETOUCH DELICA PLUS [id number]) MISC USE AS DIRECTED UP TO FOUR TIMES A DAY  . ONETOUCH VERIO test strip USE AS DIRECTED TO TEST FOUR TIMES A DAY  . pilocarpine (PILOCAR) 4 % ophthalmic solution Place 1 drop into the right eye 3 (three) times daily.   No facility-administered encounter medications on file as of 04/24/2019.     Objective:  Lab Results  Component Value Date   HGBA1C 6.3 (H) 03/25/2018   HGBA1C 6.5 (H) 02/19/2018   HGBA1C 7.1 (H) 09/02/2017   Lab Results  Component Value Date   MICROALBUR 150 02/19/2018   CREATININE 6.68 (H) 03/30/2018   BP Readings from Last 3 Encounters:  03/03/19 138/62  03/03/19 138/62  10/28/18 (!) 170/82    Goals Addressed    . COMPLETED: "Can you discuss Palliative Care with my dad"       Daughter stated Current Barriers:  [name] Knowledge Deficits related to advanced care via Palliative Care . Chronic Disease Management support and education needs related to CHF, DMII, HTN, ESRD, Glaucoma  Nurse Case  Manager Clinical Goal(s):  [name] Over the next 30 days, patient will verbalize understanding of plan for Palliative Care referral   Goal Met  CCM RN CM Interventions:  04/27/19 call completed with daughter [name]  . Determined patient is enrolled with [facility] assigned to Gonzella Lex, NP  Patient Self Care Activities:  . Unable to independently perform self care  Please see past updates related to this goal by clicking on the "Past Updates" button in the selected goal      . "We are waiting to have labs rechecked to see if he is ready  to start dialysis"       Current Barriers:  [name] Knowledge Deficits related to plan for start of Hemodialysis . Patient will need help with transportation and from Hemodialysis  Nurse Case Manager Clinical Goal(s):  [name] Over the next 60 days, patient will verbalize understanding of plan for start of outpatient hemodialysis  Goal Met . New - 01/28/19 Over the next 90 days, patient will continue to follow up with Nephrology to determine plan of care for start of Hemodialysis Goal Met . New 04/15/19 Over the next 30 days the patient will collaborate with care management team while initiating HD treatments to create a better understanding of ongoing HD treatment plan  CCM SW Interventions: Completed 04/15/19 with [name] . Outbound call placed to [name] to determine HD plan . Patient plans to start HD on Thursday 04/16/19 at the [facility] center close to the patients home o Seat time is 12:40; patient instructed to arrive by 12:25 o Daughter plans to provide transportation at this time while she is able to incorporate into work schedule - Some barriers may arise with Saturday seat time and/or in the future if [name] is out of town - SW reviewed opportunity to utilize patient health plan transportation benefit or SCAT services. Provided information for both via e-mail as requested o Collaboration with RN Case Manager to provide an update on plans to start HD . Scheduled follow up call over the next two weeks to assist with care coordination needs  CCM RN CM Interventions:  04/27/19 call completed with daughter [name] . Evaluation of current treatment plan related to ESRD & initiation of Hemodialysis and patient's adherence to plan as established by provider . Determined Mr. Brissett started HD at the [facility] on 04/17/19; daughter advised he is doing well with treatments thus far, although exhausted after each treatment . Reviewed EMR to confirm patient has received his Pneumonia  vaccine at PCP office, discussed this immunization is not listed in the EMR and or most recent PCP OV note . Discussed patient did receive his 1st COVID vaccine, Moderna on 04/17/19 via the [facility], updated EMR . Discussed daughter needs to keep this call brief but plans to give me a call back later today   Discussed plans with patient for ongoing care management follow up and provided patient with direct contact information for care management team  Patient Self Care Activities:  . Self administers medications as prescribed . Attends all scheduled provider appointments . Performs ADL's independently . Performs IADL's independently  Please see past updates related to this goal by clicking on the "Past Updates" button in the selected goal        Plan:   Telephone follow up appointment with care management team member scheduled for: 06/08/19  Barb Merino, RN, BSN, CCM Care Management Coordinator [place] [facility]  Direct Phone:  [phone]

## 2019-04-28 DIAGNOSIS — Z992 Dependence on renal dialysis: Secondary | ICD-10-CM | POA: Diagnosis not present

## 2019-04-28 DIAGNOSIS — N2581 Secondary hyperparathyroidism of renal origin: Secondary | ICD-10-CM | POA: Diagnosis not present

## 2019-04-28 DIAGNOSIS — N186 End stage renal disease: Secondary | ICD-10-CM | POA: Diagnosis not present

## 2019-04-28 DIAGNOSIS — E118 Type 2 diabetes mellitus with unspecified complications: Secondary | ICD-10-CM | POA: Diagnosis not present

## 2019-04-28 DIAGNOSIS — Z23 Encounter for immunization: Secondary | ICD-10-CM | POA: Diagnosis not present

## 2019-04-29 DIAGNOSIS — H401133 Primary open-angle glaucoma, bilateral, severe stage: Secondary | ICD-10-CM | POA: Diagnosis not present

## 2019-04-30 ENCOUNTER — Ambulatory Visit: Payer: Self-pay

## 2019-04-30 DIAGNOSIS — N2581 Secondary hyperparathyroidism of renal origin: Secondary | ICD-10-CM | POA: Diagnosis not present

## 2019-04-30 DIAGNOSIS — E118 Type 2 diabetes mellitus with unspecified complications: Secondary | ICD-10-CM | POA: Diagnosis not present

## 2019-04-30 DIAGNOSIS — N186 End stage renal disease: Secondary | ICD-10-CM | POA: Diagnosis not present

## 2019-04-30 DIAGNOSIS — E1122 Type 2 diabetes mellitus with diabetic chronic kidney disease: Secondary | ICD-10-CM

## 2019-04-30 DIAGNOSIS — Z23 Encounter for immunization: Secondary | ICD-10-CM | POA: Diagnosis not present

## 2019-04-30 DIAGNOSIS — Z992 Dependence on renal dialysis: Secondary | ICD-10-CM | POA: Diagnosis not present

## 2019-04-30 NOTE — Patient Instructions (Signed)
Social Worker Visit Information  Goals we discussed today:  Goals Addressed            This Visit's Progress   . "We are waiting to have labs rechecked to see if he is ready to start dialysis"   On track    Current Barriers:  [name] Knowledge Deficits related to plan for start of Hemodialysis . Patient will need help with transportation and from Hemodialysis  Nurse Case Manager Clinical Goal(s):  [name] Over the next 60 days, patient will verbalize understanding of plan for start of outpatient hemodialysis  Goal Met . New - 01/28/19 Over the next 90 days, patient will continue to follow up with Nephrology to determine plan of care for start of Hemodialysis Goal Met . New 04/15/19 Over the next 30 days the patient will collaborate with care management team while initiating HD treatments to create a better understanding of ongoing HD treatment plan  CCM SW Interventions: Completed 04/30/19 with [name] . Outbound call placed to [name] to follow up on patient progress with Dialysis . Determined the patient is doing well and has seemed to adjust to the schedule o [name] reports the patient seemed "less tired" after the last two sessions . Reviewed ability to arrange transportation via SCAT as needed . Collaboration with RN Case Manager to provide an update on plans to start HD  CCM RN CM Interventions:  04/27/19 call completed with daughter [name] . Evaluation of current treatment plan related to ESRD & initiation of Hemodialysis and patient's adherence to plan as established by provider . Determined Mr. Shallenberger started HD at the [facility] on 04/17/19; daughter advised he is doing well with treatments thus far, although exhausted after each treatment . Reviewed EMR to confirm patient has received his Pneumonia vaccine at PCP office, discussed this immunization is not listed in the EMR and or most recent PCP OV note . Discussed patient did receive his 1st COVID vaccine, Moderna on  04/17/19 via the [facility], updated EMR . Discussed daughter needs to keep this call brief but plans to give me a call back later today   Discussed plans with patient for ongoing care management follow up and provided patient with direct contact information for care management team  Patient Self Care Activities:  . Self administers medications as prescribed . Attends all scheduled provider appointments . Performs ADL's independently . Performs IADL's independently  Please see past updates related to this goal by clicking on the "Past Updates" button in the selected goal           Materials Provided: Verbal education about SCAT services provided by phone  Follow Up Plan: No SW follow up planned at this time. Please contact me with future care coordination needs.   [name], BSW, CDP Social Worker, Certified Dementia Practitioner [place] / [place] [facility] [phone]

## 2019-04-30 NOTE — Chronic Care Management (AMB) (Signed)
Chronic Care Management    Social Work Follow Up Note  04/30/2019 Name: Elijah White MRN: [id number] DOB: June 17, 1940  Elijah White is a 79 y.o. year old male who is a primary care patient of Elijah Chard, MD. The CCM team was consulted for assistance with care coordination.   Review of patient status, including review of consultants reports, other relevant assessments, and collaboration with appropriate care team members and the patient's provider was performed as part of comprehensive patient evaluation and provision of chronic care management services.    SDOH (Social Determinants of Health) assessments performed: No    SW placed a successful outbound call to Elijah White to assist with care coordination needs.  Outpatient Encounter Medications as of 04/30/2019  Medication Sig  . acetaminophen (TYLENOL) 500 MG tablet Take 1,000 mg by mouth every 6 (six) hours as needed for headache (pain).  [name] atorvastatin (LIPITOR) 10 MG tablet TAKE 1 TABLET BY MOUTH EVERY DAY  . BD PEN NEEDLE NANO U/F 32G X 4 MM MISC USE SUBCUTANEOUS FOUR TIMES A DAY  . bimatoprost (LUMIGAN) 0.01 % SOLN Place 1 drop into the right eye at bedtime.  . blood glucose meter kit and supplies KIT Dispense based on patient and insurance preference. Use up to four times daily as directed. (FOR ICD-9 250.00, 250.01). For QAC - HS accuchecks.  . brimonidine (ALPHAGAN) 0.15 % ophthalmic solution Place 1 drop into the right eye 3 (three) times daily.   . calcitRIOL (ROCALTROL) 0.5 MCG capsule Take 0.5 mcg by mouth daily.  . carvedilol (COREG) 3.125 MG tablet TAKE 1 TABLET(3.125 MG) BY MOUTH TWICE DAILY WITH A MEAL  . dorzolamide-timolol (COSOPT) 22.3-6.8 MG/ML ophthalmic solution Place 1 drop into the right eye 2 (two) times daily.  [name] esomeprazole (NEXIUM) 40 MG capsule TAKE 1 CAPSULE BY MOUTH EVERY DAY  . furosemide (LASIX) 80 MG tablet TAKE 1 TABLET BY MOUTH EVERY MORNING AND HALF TABLET BY MOUTH EVERY EVENING  .  gabapentin (NEURONTIN) 100 MG capsule TAKE 1 CAPSULE(100 MG) BY MOUTH AT BEDTIME  . hydrALAZINE (APRESOLINE) 25 MG tablet TAKE 1 TABLET(25 MG) BY MOUTH TWICE DAILY  . isosorbide mononitrate (IMDUR) 30 MG 24 hr tablet TAKE 1 TABLET(30 MG) BY MOUTH DAILY  . Lancets (ONETOUCH DELICA PLUS [id number]) MISC USE AS DIRECTED UP TO FOUR TIMES A DAY  . ONETOUCH VERIO test strip USE AS DIRECTED TO TEST FOUR TIMES A DAY  . pilocarpine (PILOCAR) 4 % ophthalmic solution Place 1 drop into the right eye 3 (three) times daily.   No facility-administered encounter medications on file as of 04/30/2019.     Goals Addressed            This Visit's Progress   . "We are waiting to have labs rechecked to see if he is ready to start dialysis"   On track    Current Barriers:  [name] Knowledge Deficits related to plan for start of Hemodialysis . Patient will need help with transportation and from Hemodialysis  Nurse Case Manager Clinical Goal(s):  [name] Over the next 60 days, patient will verbalize understanding of plan for start of outpatient hemodialysis  Goal Met . New - 01/28/19 Over the next 90 days, patient will continue to follow up with Nephrology to determine plan of care for start of Hemodialysis Goal Met . New 04/15/19 Over the next 30 days the patient will collaborate with care management team while initiating HD treatments to create a better understanding of ongoing HD  treatment plan  CCM SW Interventions: Completed 04/30/19 with Elijah White . Outbound call placed to Elijah White to follow up on patient progress with Dialysis . Determined the patient is doing well and has seemed to adjust to the schedule o Elijah White reports the patient seemed "less tired" after the last two sessions . Reviewed ability to arrange transportation via [facility] as needed . Collaboration with RN Case Manager to provide an update on plans to start HD  CCM RN CM Interventions:  04/27/19 call completed with daughter Elijah White . Evaluation of  current treatment plan related to ESRD & initiation of Hemodialysis and patient's adherence to plan as established by provider . Determined Mr. Zaldivar started HD at the [facility] on 04/17/19; daughter advised he is doing well with treatments thus far, although exhausted after each treatment . Reviewed EMR to confirm patient has received his Pneumonia vaccine at PCP office, discussed this immunization is not listed in the EMR and or most recent PCP OV note . Discussed patient did receive his 1st COVID vaccine, Moderna on 04/17/19 via the [facility], updated EMR . Discussed daughter needs to keep this call brief but plans to give me a call back later today   Discussed plans with patient for ongoing care management follow up and provided patient with direct contact information for care management team  Patient Self Care Activities:  . Self administers medications as prescribed . Attends all scheduled provider appointments . Performs ADL's independently . Performs IADL's independently  Please see past updates related to this goal by clicking on the "Past Updates" button in the selected goal           Follow Up Plan: No SW follow up planned at this time. The patient will remain active with RN Case Manager.   Elijah White, BSW, CDP Social Worker, Certified Dementia Practitioner [place] [facility] [phone]  Total time spent performing care coordination and/or care management activities with the patient by phone or face to face = 7 minutes.

## 2019-05-02 DIAGNOSIS — N2581 Secondary hyperparathyroidism of renal origin: Secondary | ICD-10-CM | POA: Diagnosis not present

## 2019-05-02 DIAGNOSIS — E118 Type 2 diabetes mellitus with unspecified complications: Secondary | ICD-10-CM | POA: Diagnosis not present

## 2019-05-02 DIAGNOSIS — N186 End stage renal disease: Secondary | ICD-10-CM | POA: Diagnosis not present

## 2019-05-02 DIAGNOSIS — Z992 Dependence on renal dialysis: Secondary | ICD-10-CM | POA: Diagnosis not present

## 2019-05-02 DIAGNOSIS — Z23 Encounter for immunization: Secondary | ICD-10-CM | POA: Diagnosis not present

## 2019-05-03 DIAGNOSIS — Z992 Dependence on renal dialysis: Secondary | ICD-10-CM | POA: Diagnosis not present

## 2019-05-03 DIAGNOSIS — I129 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 kidney disease, or unspecified chronic kidney disease: Secondary | ICD-10-CM | POA: Diagnosis not present

## 2019-05-03 DIAGNOSIS — N186 End stage renal disease: Secondary | ICD-10-CM | POA: Diagnosis not present

## 2019-05-05 DIAGNOSIS — Z992 Dependence on renal dialysis: Secondary | ICD-10-CM | POA: Diagnosis not present

## 2019-05-05 DIAGNOSIS — N2581 Secondary hyperparathyroidism of renal origin: Secondary | ICD-10-CM | POA: Diagnosis not present

## 2019-05-05 DIAGNOSIS — R52 Pain, unspecified: Secondary | ICD-10-CM | POA: Diagnosis not present

## 2019-05-05 DIAGNOSIS — N186 End stage renal disease: Secondary | ICD-10-CM | POA: Diagnosis not present

## 2019-05-07 DIAGNOSIS — Z992 Dependence on renal dialysis: Secondary | ICD-10-CM | POA: Diagnosis not present

## 2019-05-07 DIAGNOSIS — R52 Pain, unspecified: Secondary | ICD-10-CM | POA: Diagnosis not present

## 2019-05-07 DIAGNOSIS — N186 End stage renal disease: Secondary | ICD-10-CM | POA: Diagnosis not present

## 2019-05-07 DIAGNOSIS — N2581 Secondary hyperparathyroidism of renal origin: Secondary | ICD-10-CM | POA: Diagnosis not present

## 2019-05-09 DIAGNOSIS — Z992 Dependence on renal dialysis: Secondary | ICD-10-CM | POA: Diagnosis not present

## 2019-05-09 DIAGNOSIS — R52 Pain, unspecified: Secondary | ICD-10-CM | POA: Diagnosis not present

## 2019-05-09 DIAGNOSIS — N2581 Secondary hyperparathyroidism of renal origin: Secondary | ICD-10-CM | POA: Diagnosis not present

## 2019-05-09 DIAGNOSIS — N186 End stage renal disease: Secondary | ICD-10-CM | POA: Diagnosis not present

## 2019-05-12 DIAGNOSIS — R52 Pain, unspecified: Secondary | ICD-10-CM | POA: Diagnosis not present

## 2019-05-12 DIAGNOSIS — Z992 Dependence on renal dialysis: Secondary | ICD-10-CM | POA: Diagnosis not present

## 2019-05-12 DIAGNOSIS — N186 End stage renal disease: Secondary | ICD-10-CM | POA: Diagnosis not present

## 2019-05-12 DIAGNOSIS — N2581 Secondary hyperparathyroidism of renal origin: Secondary | ICD-10-CM | POA: Diagnosis not present

## 2019-05-14 DIAGNOSIS — N186 End stage renal disease: Secondary | ICD-10-CM | POA: Diagnosis not present

## 2019-05-14 DIAGNOSIS — Z992 Dependence on renal dialysis: Secondary | ICD-10-CM | POA: Diagnosis not present

## 2019-05-14 DIAGNOSIS — N2581 Secondary hyperparathyroidism of renal origin: Secondary | ICD-10-CM | POA: Diagnosis not present

## 2019-05-14 DIAGNOSIS — R52 Pain, unspecified: Secondary | ICD-10-CM | POA: Diagnosis not present

## 2019-05-16 ENCOUNTER — Other Ambulatory Visit: Payer: Self-pay | Admitting: Internal Medicine

## 2019-05-16 DIAGNOSIS — Z992 Dependence on renal dialysis: Secondary | ICD-10-CM | POA: Diagnosis not present

## 2019-05-16 DIAGNOSIS — N2581 Secondary hyperparathyroidism of renal origin: Secondary | ICD-10-CM | POA: Diagnosis not present

## 2019-05-16 DIAGNOSIS — R52 Pain, unspecified: Secondary | ICD-10-CM | POA: Diagnosis not present

## 2019-05-16 DIAGNOSIS — N186 End stage renal disease: Secondary | ICD-10-CM | POA: Diagnosis not present

## 2019-05-19 DIAGNOSIS — N2581 Secondary hyperparathyroidism of renal origin: Secondary | ICD-10-CM | POA: Diagnosis not present

## 2019-05-19 DIAGNOSIS — R52 Pain, unspecified: Secondary | ICD-10-CM | POA: Diagnosis not present

## 2019-05-19 DIAGNOSIS — N186 End stage renal disease: Secondary | ICD-10-CM | POA: Diagnosis not present

## 2019-05-19 DIAGNOSIS — Z992 Dependence on renal dialysis: Secondary | ICD-10-CM | POA: Diagnosis not present

## 2019-05-21 DIAGNOSIS — N186 End stage renal disease: Secondary | ICD-10-CM | POA: Diagnosis not present

## 2019-05-21 DIAGNOSIS — R52 Pain, unspecified: Secondary | ICD-10-CM | POA: Diagnosis not present

## 2019-05-21 DIAGNOSIS — N2581 Secondary hyperparathyroidism of renal origin: Secondary | ICD-10-CM | POA: Diagnosis not present

## 2019-05-21 DIAGNOSIS — Z992 Dependence on renal dialysis: Secondary | ICD-10-CM | POA: Diagnosis not present

## 2019-05-23 DIAGNOSIS — R52 Pain, unspecified: Secondary | ICD-10-CM | POA: Diagnosis not present

## 2019-05-23 DIAGNOSIS — N186 End stage renal disease: Secondary | ICD-10-CM | POA: Diagnosis not present

## 2019-05-23 DIAGNOSIS — N2581 Secondary hyperparathyroidism of renal origin: Secondary | ICD-10-CM | POA: Diagnosis not present

## 2019-05-23 DIAGNOSIS — Z992 Dependence on renal dialysis: Secondary | ICD-10-CM | POA: Diagnosis not present

## 2019-05-26 DIAGNOSIS — N2581 Secondary hyperparathyroidism of renal origin: Secondary | ICD-10-CM | POA: Diagnosis not present

## 2019-05-26 DIAGNOSIS — N186 End stage renal disease: Secondary | ICD-10-CM | POA: Diagnosis not present

## 2019-05-26 DIAGNOSIS — Z992 Dependence on renal dialysis: Secondary | ICD-10-CM | POA: Diagnosis not present

## 2019-05-26 DIAGNOSIS — R52 Pain, unspecified: Secondary | ICD-10-CM | POA: Diagnosis not present

## 2019-05-27 DIAGNOSIS — H401133 Primary open-angle glaucoma, bilateral, severe stage: Secondary | ICD-10-CM | POA: Diagnosis not present

## 2019-05-28 DIAGNOSIS — N186 End stage renal disease: Secondary | ICD-10-CM | POA: Diagnosis not present

## 2019-05-28 DIAGNOSIS — Z992 Dependence on renal dialysis: Secondary | ICD-10-CM | POA: Diagnosis not present

## 2019-05-28 DIAGNOSIS — R52 Pain, unspecified: Secondary | ICD-10-CM | POA: Diagnosis not present

## 2019-05-28 DIAGNOSIS — N2581 Secondary hyperparathyroidism of renal origin: Secondary | ICD-10-CM | POA: Diagnosis not present

## 2019-05-30 DIAGNOSIS — N2581 Secondary hyperparathyroidism of renal origin: Secondary | ICD-10-CM | POA: Diagnosis not present

## 2019-05-30 DIAGNOSIS — Z992 Dependence on renal dialysis: Secondary | ICD-10-CM | POA: Diagnosis not present

## 2019-05-30 DIAGNOSIS — R52 Pain, unspecified: Secondary | ICD-10-CM | POA: Diagnosis not present

## 2019-05-30 DIAGNOSIS — N186 End stage renal disease: Secondary | ICD-10-CM | POA: Diagnosis not present

## 2019-06-02 DIAGNOSIS — R52 Pain, unspecified: Secondary | ICD-10-CM | POA: Diagnosis not present

## 2019-06-02 DIAGNOSIS — N186 End stage renal disease: Secondary | ICD-10-CM | POA: Diagnosis not present

## 2019-06-02 DIAGNOSIS — N2581 Secondary hyperparathyroidism of renal origin: Secondary | ICD-10-CM | POA: Diagnosis not present

## 2019-06-02 DIAGNOSIS — Z992 Dependence on renal dialysis: Secondary | ICD-10-CM | POA: Diagnosis not present

## 2019-06-03 DIAGNOSIS — Z992 Dependence on renal dialysis: Secondary | ICD-10-CM | POA: Diagnosis not present

## 2019-06-03 DIAGNOSIS — I129 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 kidney disease, or unspecified chronic kidney disease: Secondary | ICD-10-CM | POA: Diagnosis not present

## 2019-06-03 DIAGNOSIS — N186 End stage renal disease: Secondary | ICD-10-CM | POA: Diagnosis not present

## 2019-06-04 DIAGNOSIS — N2581 Secondary hyperparathyroidism of renal origin: Secondary | ICD-10-CM | POA: Diagnosis not present

## 2019-06-04 DIAGNOSIS — Z992 Dependence on renal dialysis: Secondary | ICD-10-CM | POA: Diagnosis not present

## 2019-06-04 DIAGNOSIS — Z23 Encounter for immunization: Secondary | ICD-10-CM | POA: Diagnosis not present

## 2019-06-04 DIAGNOSIS — E118 Type 2 diabetes mellitus with unspecified complications: Secondary | ICD-10-CM | POA: Diagnosis not present

## 2019-06-04 DIAGNOSIS — N186 End stage renal disease: Secondary | ICD-10-CM | POA: Diagnosis not present

## 2019-06-06 DIAGNOSIS — Z992 Dependence on renal dialysis: Secondary | ICD-10-CM | POA: Diagnosis not present

## 2019-06-06 DIAGNOSIS — E118 Type 2 diabetes mellitus with unspecified complications: Secondary | ICD-10-CM | POA: Diagnosis not present

## 2019-06-06 DIAGNOSIS — N2581 Secondary hyperparathyroidism of renal origin: Secondary | ICD-10-CM | POA: Diagnosis not present

## 2019-06-06 DIAGNOSIS — N186 End stage renal disease: Secondary | ICD-10-CM | POA: Diagnosis not present

## 2019-06-06 DIAGNOSIS — Z23 Encounter for immunization: Secondary | ICD-10-CM | POA: Diagnosis not present

## 2019-06-08 ENCOUNTER — Ambulatory Visit (INDEPENDENT_AMBULATORY_CARE_PROVIDER_SITE_OTHER): Payer: Medicare Other

## 2019-06-08 ENCOUNTER — Other Ambulatory Visit: Payer: Self-pay

## 2019-06-08 ENCOUNTER — Telehealth: Payer: Self-pay

## 2019-06-08 DIAGNOSIS — N186 End stage renal disease: Secondary | ICD-10-CM

## 2019-06-08 DIAGNOSIS — I5032 Chronic diastolic (congestive) heart failure: Secondary | ICD-10-CM

## 2019-06-08 DIAGNOSIS — N185 Chronic kidney disease, stage 5: Secondary | ICD-10-CM

## 2019-06-08 DIAGNOSIS — E1122 Type 2 diabetes mellitus with diabetic chronic kidney disease: Secondary | ICD-10-CM

## 2019-06-08 DIAGNOSIS — H401133 Primary open-angle glaucoma, bilateral, severe stage: Secondary | ICD-10-CM

## 2019-06-09 DIAGNOSIS — N186 End stage renal disease: Secondary | ICD-10-CM | POA: Diagnosis not present

## 2019-06-09 DIAGNOSIS — Z23 Encounter for immunization: Secondary | ICD-10-CM | POA: Diagnosis not present

## 2019-06-09 DIAGNOSIS — Z992 Dependence on renal dialysis: Secondary | ICD-10-CM | POA: Diagnosis not present

## 2019-06-09 DIAGNOSIS — E118 Type 2 diabetes mellitus with unspecified complications: Secondary | ICD-10-CM | POA: Diagnosis not present

## 2019-06-09 DIAGNOSIS — N2581 Secondary hyperparathyroidism of renal origin: Secondary | ICD-10-CM | POA: Diagnosis not present

## 2019-06-10 NOTE — Chronic Care Management (AMB) (Signed)
Chronic Care Management   Follow Up Note   06/09/2019 Name: Elijah White MRN: [id number] DOB: 1940-03-14  Referred by: [name], MD Reason for referral : Chronic Care Management (FU RN CALL-ESRD on HD)   Elijah White is a 79 y.o. year old male who is a primary care patient of [name], MD. The CCM team was consulted for assistance with chronic disease management and care coordination needs.    Review of patient status, including review of consultants reports, relevant laboratory and other test results, and collaboration with appropriate care team members and the patient's provider was performed as part of comprehensive patient evaluation and provision of chronic care management services.    SDOH (Social Determinants of Health) assessments performed: Yes See Care Plan activities for detailed interventions related to [place])   Placed outbound CCM RN CM follow up call to daughter Elijah White to assess for CCM needs.     Outpatient Encounter Medications as of 06/08/2019  Medication Sig  . acetaminophen (TYLENOL) 500 MG tablet Take 1,000 mg by mouth every 6 (six) hours as needed for headache (pain).  Elijah White atorvastatin (LIPITOR) 10 MG tablet TAKE 1 TABLET BY MOUTH EVERY DAY  . BD PEN NEEDLE NANO U/F 32G X 4 MM MISC USE SUBCUTANEOUS FOUR TIMES A DAY  . bimatoprost (LUMIGAN) 0.01 % SOLN Place 1 drop into the right eye at bedtime.  . blood glucose meter kit and supplies KIT Dispense based on patient and insurance preference. Use up to four times daily as directed. (FOR ICD-9 250.00, 250.01). For QAC - HS accuchecks.  . brimonidine (ALPHAGAN) 0.15 % ophthalmic solution Place 1 drop into the right eye 3 (three) times daily.   . calcitRIOL (ROCALTROL) 0.5 MCG capsule Take 0.5 mcg by mouth daily.  . carvedilol (COREG) 3.125 MG tablet TAKE 1 TABLET(3.125 MG) BY MOUTH TWICE DAILY WITH A MEAL  . dorzolamide-timolol (COSOPT) 22.3-6.8 MG/ML ophthalmic solution Place 1 drop into the right eye 2 (two)  times daily.  Elijah White esomeprazole (NEXIUM) 40 MG capsule TAKE 1 CAPSULE BY MOUTH EVERY DAY  . furosemide (LASIX) 80 MG tablet TAKE 1 TABLET BY MOUTH EVERY MORNING AND HALF TABLET BY MOUTH EVERY EVENING  . gabapentin (NEURONTIN) 100 MG capsule TAKE 1 CAPSULE(100 MG) BY MOUTH AT BEDTIME  . hydrALAZINE (APRESOLINE) 25 MG tablet TAKE 1 TABLET(25 MG) BY MOUTH TWICE DAILY  . isosorbide mononitrate (IMDUR) 30 MG 24 hr tablet TAKE 1 TABLET(30 MG) BY MOUTH DAILY  . Lancets (ONETOUCH DELICA PLUS [id number]) MISC USE AS DIRECTED UP TO FOUR TIMES A DAY  . ONETOUCH VERIO test strip USE AS DIRECTED TO TEST FOUR TIMES A DAY  . pilocarpine (PILOCAR) 4 % ophthalmic solution Place 1 drop into the right eye 3 (three) times daily.   No facility-administered encounter medications on file as of 06/08/2019.     Objective:  Lab Results  Component Value Date   HGBA1C 6.3 (H) 03/25/2018   HGBA1C 6.5 (H) 02/19/2018   HGBA1C 7.1 (H) 09/02/2017   BP Readings from Last 3 Encounters:  03/03/19 138/62  03/03/19 138/62  10/28/18 (!) 170/82    Goals Addressed    . "I need resources to help me develope a long term care plan for my dad"       Daughter stated [place] (see longtitudinal plan of care for additional care plan information)  Current Barriers:  Elijah White Knowledge Deficits related to Long Term Caregiver Options . Lacks caregiver support.  . Chronic  Disease Management support and education needs related to CHF, DM II, Essential Hypertension, ESRD on HD, Glaucoma  Nurse Case Manager Clinical Goal(s):  Elijah White Over the next 90 days, patient will work with the embedded BSW to address needs related to Resources for long term caregiver options  CCM RN CM Interventions:  06/09/19 call completed with daughter Elijah White  . Determined patient and daughter Elijah White have started having brief conversations about plans for patient's housing and care once he is unable to live alone . Determined Elijah White would like for information about  long term care solutions so they may start planning for the future as her dad's vision impairment and or comorbidities may prevent him from living alone much longer  . Collaborated with embedded BSW [name] regarding daughter [name]'s request for resources for long term caregiver options . Discussed plans with patient for ongoing care management follow up and provided patient with direct contact information for care management team  Patient Self Care Activities:  . Self administers medications as prescribed . Attends all scheduled provider appointments . Performs ADL's independently . Performs IADL's independently  Initial goal documentation     . COMPLETED: "We are waiting to have labs rechecked to see if he is ready to start dialysis"       Current Barriers:  Elijah White Knowledge Deficits related to plan for start of Hemodialysis . Patient will need help with transportation and from Hemodialysis  Nurse Case Manager Clinical Goal(s):  Elijah White Over the next 60 days, patient will verbalize understanding of plan for start of outpatient hemodialysis  Goal Met . New - 01/28/19 Over the next 90 days, patient will continue to follow up with Nephrology to determine plan of care for start of Hemodialysis Goal Met . New 04/15/19 Over the next 30 days the patient will collaborate with care management team while initiating HD treatments to create a better understanding of ongoing HD treatment plan Goal Met   CCM RN CM Interventions:  06/09/19 call completed with daughter Elijah White . Evaluation of current treatment plan related to ESRD & initiation of Hemodialysis and patient's adherence to plan as established by provider . Determined Mr. Bagheri started HD at the [facility] on 04/17/19; daughter advised he is doing well with treatments thus far, although exhausted after each treatment . Determined Elijah White is providing transportation at this time and ensures her father gets home safely and is able to care for  himself following his HD txts  Discussed plans with patient for ongoing care management follow up and provided patient with direct contact information for care management team  Patient Self Care Activities:  . Self administers medications as prescribed . Attends all scheduled provider appointments . Performs ADL's independently . Performs IADL's independently  Please see past updates related to this goal by clicking on the "Past Updates" button in the selected goal       . To provide education and support to improve Self Health management of DM       CARE PLAN ENTRY (see longtitudinal plan of care for additional care plan information)  Current Barriers:  Elijah White Knowledge Deficits related to disease process and Self Health management of DM . Lacks caregiver support.  . Chronic Disease Management support and education needs related to CHF, DMII, ESRD on HD, Glaucoma  Nurse Case Manager Clinical Goal(s):  Elijah White Over the next 90 days, patient will work with the [place] CM and PCP to address needs related to disease education and support to  improve Self Health management of DM  CCM RN CM Interventions:  06/09/19 call completed with daughter Elijah White  . Provided patient with printed educational materials related to Managing Diabetes using Meal Planning; Diabetes Zone Safety Tool; Carb Counting; Carb Choices  Patient Self Care Activities:  . Self administers medications as prescribed . Attends all scheduled provider appointments . Calls pharmacy for medication refills . Performs ADL's independently . Performs IADL's independently . Calls provider office for new concerns or questions  Initial goal documentation         Plan:   The care management team will reach out to the patient again over the next 30-45 days.   Barb Merino, RN, BSN, CCM Care Management Coordinator [facility] [facility]  Direct Phone: [phone]

## 2019-06-10 NOTE — Patient Instructions (Signed)
Visit Information  Goals Addressed    . "I need resources to help me develope a long term care plan for my dad"       Daughter stated [place] (see longtitudinal plan of care for additional care plan information)  Current Barriers:  [name] Knowledge Deficits related to Long Term Caregiver Options . Lacks caregiver support.  . Chronic Disease Management support and education needs related to CHF, DM II, Essential Hypertension, ESRD on HD, Glaucoma  Nurse Case Manager Clinical Goal(s):  [name] Over the next 90 days, patient will work with the embedded BSW to address needs related to Resources for long term caregiver options  CCM RN CM Interventions:  06/09/19 call completed with daughter [name]  . Determined patient and daughter [name] have started having brief conversations about plans for patient's housing and care once he is unable to live alone . Determined [name] would like for information about long term care solutions so they may start planning for the future as her dad's vision impairment and or comorbidities may prevent him from living alone much longer  . Collaborated with embedded BSW [name] regarding daughter [name]'s request for resources for long term caregiver options . Discussed plans with patient for ongoing care management follow up and provided patient with direct contact information for care management team  Patient Self Care Activities:  . Self administers medications as prescribed . Attends all scheduled provider appointments . Performs ADL's independently . Performs IADL's independently  Initial goal documentation     . COMPLETED: "We are waiting to have labs rechecked to see if he is ready to start dialysis"       Current Barriers:  [name] Knowledge Deficits related to plan for start of Hemodialysis . Patient will need help with transportation and from Hemodialysis  Nurse Case Manager Clinical Goal(s):  [name] Over the next 60 days, patient will verbalize  understanding of plan for start of outpatient hemodialysis  Goal Met . New - 01/28/19 Over the next 90 days, patient will continue to follow up with Nephrology to determine plan of care for start of Hemodialysis Goal Met . New 04/15/19 Over the next 30 days the patient will collaborate with care management team while initiating HD treatments to create a better understanding of ongoing HD treatment plan Goal Met   CCM RN CM Interventions:  06/09/19 call completed with daughter [name] . Evaluation of current treatment plan related to ESRD & initiation of Hemodialysis and patient's adherence to plan as established by provider . Determined Mr. Guthmiller started HD at the [facility] on 04/17/19; daughter advised he is doing well with treatments thus far, although exhausted after each treatment . Determined [name] is providing transportation at this time and ensures her father gets home safely and is able to care for himself following his HD txts  Discussed plans with patient for ongoing care management follow up and provided patient with direct contact information for care management team  Patient Self Care Activities:  . Self administers medications as prescribed . Attends all scheduled provider appointments . Performs ADL's independently . Performs IADL's independently  Please see past updates related to this goal by clicking on the "Past Updates" button in the selected goal       . To provide education and support to improve Self Health management of DM       CARE PLAN ENTRY (see longtitudinal plan of care for additional care plan information)  Current Barriers:  [name] Knowledge Deficits  related to disease process and Self Health management of DM . Lacks caregiver support.  . Chronic Disease Management support and education needs related to CHF, DMII, ESRD on HD, Glaucoma  Nurse Case Manager Clinical Goal(s):  [name] Over the next 90 days, patient will work with the [place] CM and PCP to  address needs related to disease education and support to improve Self Health management of DM  CCM RN CM Interventions:  06/09/19 call completed with daughter [name]  . Provided patient with printed educational materials related to Managing Diabetes using Meal Planning; Diabetes Zone Safety Tool; Carb Counting; Carb Choices  Patient Self Care Activities:  . Self administers medications as prescribed . Attends all scheduled provider appointments . Calls pharmacy for medication refills . Performs ADL's independently . Performs IADL's independently . Calls provider office for new concerns or questions  Initial goal documentation        Patient verbalizes understanding of instructions provided today.   The care management team will reach out to the patient again over the next 30-45 days.   Barb Merino, RN, BSN, CCM Care Management Coordinator [facility]  Direct Phone: [phone]

## 2019-06-11 DIAGNOSIS — E118 Type 2 diabetes mellitus with unspecified complications: Secondary | ICD-10-CM | POA: Diagnosis not present

## 2019-06-11 DIAGNOSIS — Z23 Encounter for immunization: Secondary | ICD-10-CM | POA: Diagnosis not present

## 2019-06-11 DIAGNOSIS — N2581 Secondary hyperparathyroidism of renal origin: Secondary | ICD-10-CM | POA: Diagnosis not present

## 2019-06-11 DIAGNOSIS — N186 End stage renal disease: Secondary | ICD-10-CM | POA: Diagnosis not present

## 2019-06-11 DIAGNOSIS — Z992 Dependence on renal dialysis: Secondary | ICD-10-CM | POA: Diagnosis not present

## 2019-06-12 ENCOUNTER — Ambulatory Visit: Payer: Self-pay

## 2019-06-12 DIAGNOSIS — I5032 Chronic diastolic (congestive) heart failure: Secondary | ICD-10-CM

## 2019-06-12 DIAGNOSIS — E1122 Type 2 diabetes mellitus with diabetic chronic kidney disease: Secondary | ICD-10-CM

## 2019-06-12 DIAGNOSIS — N186 End stage renal disease: Secondary | ICD-10-CM

## 2019-06-12 DIAGNOSIS — Z992 Dependence on renal dialysis: Secondary | ICD-10-CM | POA: Diagnosis not present

## 2019-06-12 DIAGNOSIS — N185 Chronic kidney disease, stage 5: Secondary | ICD-10-CM | POA: Diagnosis not present

## 2019-06-12 NOTE — Patient Instructions (Signed)
Social Worker Visit Information  Goals we discussed today:  Goals Addressed            This Visit's Progress   . "I need resources to help me develope a long term care plan for my dad"       Daughter stated [place] (see longtitudinal plan of care for additional care plan information)  Current Barriers:  [name] Knowledge Deficits related to Long Term Caregiver Options . Lacks caregiver support.  . Chronic Disease Management support and education needs related to CHF, DM II, Essential Hypertension, ESRD on HD, Glaucoma  Nurse Case Manager Clinical Goal(s):  [name] Over the next 90 days, patient will work with the embedded BSW to address needs related to Resources for long term caregiver options  CCM SW Interventions Completed 06/12/2019 with daughter [name] . Successful outbound call to the patients daughter to discuss options for long term care planning . Determined [name] is concerned that the patient may need a caregiver in the home to assist with ADL's or will need placement as his vision continues to decline . Discussed concern of patient being extremely tired on dialysis days and the inability for the patient to live with [name] due to her homes floor plan . Educated [name] on the difference of community based Medicaid and long term care Medicaid o Encouraged [name] to apply for Medicaid on behalf of the patient to see if he will qualify for community based Medicaid which will allow the patient to apply for PCS o Advised [name] that if the patient were to need placement and could not afford the cost, he could apply for assistance through Medicaid for long term care assistance o Determined [name] will contact DSS to initiate Medicaid application . Provided verbal education on the [employer]HS in home aide grant funded program o [name] has declined a referral at this time due to length of wait list stating "he will need something sooner than they can offer" . [facility]ed [facility] o [name] has declined referral due to wanting to remain with patients primary care team . Rosamaria Lints SW would follow up over the next month to assist with ongoing care coordination needs  CCM RN CM Interventions:  06/09/19 call completed with daughter [name]  . Determined patient and daughter [name] have started having brief conversations about plans for patient's housing and care once he is unable to live alone . Determined [name] would like for information about long term care solutions so they may start planning for the future as her dad's vision impairment and or comorbidities may prevent him from living alone much longer  . Collaborated with embedded BSW [name] regarding daughter [name]'s request for resources for long term caregiver options . Discussed plans with patient for ongoing care management follow up and provided patient with direct contact information for care management team  Patient Self Care Activities:  . Self administers medications as prescribed . Attends all scheduled provider appointments . Performs ADL's independently . Performs IADL's independently  Please see past updates related to this goal by clicking on the "Past Updates" button in the selected goal          Materials Provided: Verbal education about community resources provided by phone  Follow Up Plan: SW will follow up with patient by phone over the next month   [name], BSW, CDP Social Worker, Certified Dementia Practitioner [place] [facility] [phone]

## 2019-06-12 NOTE — Chronic Care Management (AMB) (Signed)
Chronic Care Management    Social Work Follow Up Note  06/12/2019 Name: Elijah White MRN: [id number] DOB: 08/24/1940  Elijah White is a 79 y.o. year old male who is a primary care patient of [name], MD. The CCM team was consulted for assistance with care coordination.   Review of patient status, including review of consultants reports, other relevant assessments, and collaboration with appropriate care team members and the patient's provider was performed as part of comprehensive patient evaluation and provision of chronic care management services.    SDOH (Social Determinants of Health) assessments performed: No    Outpatient Encounter Medications as of 06/12/2019  Medication Sig  . acetaminophen (TYLENOL) 500 MG tablet Take 1,000 mg by mouth every 6 (six) hours as needed for headache (pain).  [name] atorvastatin (LIPITOR) 10 MG tablet TAKE 1 TABLET BY MOUTH EVERY DAY  . BD PEN NEEDLE NANO U/F 32G X 4 MM MISC USE SUBCUTANEOUS FOUR TIMES A DAY  . bimatoprost (LUMIGAN) 0.01 % SOLN Place 1 drop into the right eye at bedtime.  . blood glucose meter kit and supplies KIT Dispense based on patient and insurance preference. Use up to four times daily as directed. (FOR ICD-9 250.00, 250.01). For QAC - HS accuchecks.  . brimonidine (ALPHAGAN) 0.15 % ophthalmic solution Place 1 drop into the right eye 3 (three) times daily.   . calcitRIOL (ROCALTROL) 0.5 MCG capsule Take 0.5 mcg by mouth daily.  . carvedilol (COREG) 3.125 MG tablet TAKE 1 TABLET(3.125 MG) BY MOUTH TWICE DAILY WITH A MEAL  . dorzolamide-timolol (COSOPT) 22.3-6.8 MG/ML ophthalmic solution Place 1 drop into the right eye 2 (two) times daily.  [name] esomeprazole (NEXIUM) 40 MG capsule TAKE 1 CAPSULE BY MOUTH EVERY DAY  . furosemide (LASIX) 80 MG tablet TAKE 1 TABLET BY MOUTH EVERY MORNING AND HALF TABLET BY MOUTH EVERY EVENING  . gabapentin (NEURONTIN) 100 MG capsule TAKE 1 CAPSULE(100 MG) BY MOUTH AT BEDTIME  . hydrALAZINE  (APRESOLINE) 25 MG tablet TAKE 1 TABLET(25 MG) BY MOUTH TWICE DAILY  . isosorbide mononitrate (IMDUR) 30 MG 24 hr tablet TAKE 1 TABLET(30 MG) BY MOUTH DAILY  . Lancets (ONETOUCH DELICA PLUS [id number]) MISC USE AS DIRECTED UP TO FOUR TIMES A DAY  . ONETOUCH VERIO test strip USE AS DIRECTED TO TEST FOUR TIMES A DAY  . pilocarpine (PILOCAR) 4 % ophthalmic solution Place 1 drop into the right eye 3 (three) times daily.   No facility-administered encounter medications on file as of 06/12/2019.     Goals Addressed            This Visit's Progress   . "I need resources to help me develope a long term care plan for my dad"       Daughter stated [place] (see longtitudinal plan of care for additional care plan information)  Current Barriers:  [name] Knowledge Deficits related to Long Term Caregiver Options . Lacks caregiver support.  . Chronic Disease Management support and education needs related to CHF, DM II, Essential Hypertension, ESRD on HD, Glaucoma  Nurse Case Manager Clinical Goal(s):  [name] Over the next 90 days, patient will work with the embedded BSW to address needs related to Resources for long term caregiver options  CCM SW Interventions Completed 06/12/2019 with daughter [name] . Successful outbound call to the patients daughter to discuss options for long term care planning . Determined [name] is concerned that the patient may need a caregiver in the home to  assist with ADL's or will need placement as his vision continues to decline . Discussed concern of patient being extremely tired on dialysis days and the inability for the patient to live with [name] due to her homes floor plan . Educated [name] on the difference of community based Medicaid and long term care Medicaid o Encouraged [name] to apply for Medicaid on behalf of the patient to see if he will qualify for community based Medicaid which will allow the patient to apply for PCS o Advised [name] that if the patient were to need  placement and could not afford the cost, he could apply for assistance through Medicaid for long term care assistance o Determined [name] will contact DSS to initiate Medicaid application . Provided verbal education on the [employer]HS in home aide grant funded program o [name] has declined a referral at this time due to length of wait list stating "he will need something sooner than they can offer" . [facility]ed [facility] o [name] has declined referral due to wanting to remain with patients primary care team . Rosamaria Lints SW would follow up over the next month to assist with ongoing care coordination needs  CCM RN CM Interventions:  06/09/19 call completed with daughter [name]  . Determined patient and daughter [name] have started having brief conversations about plans for patient's housing and care once he is unable to live alone . Determined [name] would like for information about long term care solutions so they may start planning for the future as her dad's vision impairment and or comorbidities may prevent him from living alone much longer  . Collaborated with embedded BSW [name] regarding daughter [name]'s request for resources for long term caregiver options . Discussed plans with patient for ongoing care management follow up and provided patient with direct contact information for care management team  Patient Self Care Activities:  . Self administers medications as prescribed . Attends all scheduled provider appointments . Performs ADL's independently . Performs IADL's independently  Please see past updates related to this goal by clicking on the "Past Updates" button in the selected goal          Follow Up Plan: SW will follow up with patient by phone over the next month.  [name], BSW, CDP Social Worker, Certified Dementia Practitioner [facility] [phone]  Total time spent performing care coordination and/or care management activities  with the patient by phone or face to face = 24 minutes.

## 2019-06-13 ENCOUNTER — Other Ambulatory Visit: Payer: Self-pay | Admitting: Internal Medicine

## 2019-06-13 DIAGNOSIS — E118 Type 2 diabetes mellitus with unspecified complications: Secondary | ICD-10-CM | POA: Diagnosis not present

## 2019-06-13 DIAGNOSIS — Z23 Encounter for immunization: Secondary | ICD-10-CM | POA: Diagnosis not present

## 2019-06-13 DIAGNOSIS — Z992 Dependence on renal dialysis: Secondary | ICD-10-CM | POA: Diagnosis not present

## 2019-06-13 DIAGNOSIS — N186 End stage renal disease: Secondary | ICD-10-CM | POA: Diagnosis not present

## 2019-06-13 DIAGNOSIS — N2581 Secondary hyperparathyroidism of renal origin: Secondary | ICD-10-CM | POA: Diagnosis not present

## 2019-06-16 DIAGNOSIS — N2581 Secondary hyperparathyroidism of renal origin: Secondary | ICD-10-CM | POA: Diagnosis not present

## 2019-06-16 DIAGNOSIS — Z992 Dependence on renal dialysis: Secondary | ICD-10-CM | POA: Diagnosis not present

## 2019-06-16 DIAGNOSIS — N186 End stage renal disease: Secondary | ICD-10-CM | POA: Diagnosis not present

## 2019-06-16 DIAGNOSIS — E118 Type 2 diabetes mellitus with unspecified complications: Secondary | ICD-10-CM | POA: Diagnosis not present

## 2019-06-16 DIAGNOSIS — Z23 Encounter for immunization: Secondary | ICD-10-CM | POA: Diagnosis not present

## 2019-06-18 DIAGNOSIS — Z992 Dependence on renal dialysis: Secondary | ICD-10-CM | POA: Diagnosis not present

## 2019-06-18 DIAGNOSIS — N186 End stage renal disease: Secondary | ICD-10-CM | POA: Diagnosis not present

## 2019-06-18 DIAGNOSIS — N2581 Secondary hyperparathyroidism of renal origin: Secondary | ICD-10-CM | POA: Diagnosis not present

## 2019-06-18 DIAGNOSIS — E118 Type 2 diabetes mellitus with unspecified complications: Secondary | ICD-10-CM | POA: Diagnosis not present

## 2019-06-18 DIAGNOSIS — Z23 Encounter for immunization: Secondary | ICD-10-CM | POA: Diagnosis not present

## 2019-06-20 DIAGNOSIS — Z23 Encounter for immunization: Secondary | ICD-10-CM | POA: Diagnosis not present

## 2019-06-20 DIAGNOSIS — N2581 Secondary hyperparathyroidism of renal origin: Secondary | ICD-10-CM | POA: Diagnosis not present

## 2019-06-20 DIAGNOSIS — E118 Type 2 diabetes mellitus with unspecified complications: Secondary | ICD-10-CM | POA: Diagnosis not present

## 2019-06-20 DIAGNOSIS — N186 End stage renal disease: Secondary | ICD-10-CM | POA: Diagnosis not present

## 2019-06-20 DIAGNOSIS — Z992 Dependence on renal dialysis: Secondary | ICD-10-CM | POA: Diagnosis not present

## 2019-06-23 DIAGNOSIS — E118 Type 2 diabetes mellitus with unspecified complications: Secondary | ICD-10-CM | POA: Diagnosis not present

## 2019-06-23 DIAGNOSIS — N186 End stage renal disease: Secondary | ICD-10-CM | POA: Diagnosis not present

## 2019-06-23 DIAGNOSIS — Z23 Encounter for immunization: Secondary | ICD-10-CM | POA: Diagnosis not present

## 2019-06-23 DIAGNOSIS — Z992 Dependence on renal dialysis: Secondary | ICD-10-CM | POA: Diagnosis not present

## 2019-06-23 DIAGNOSIS — N2581 Secondary hyperparathyroidism of renal origin: Secondary | ICD-10-CM | POA: Diagnosis not present

## 2019-06-25 ENCOUNTER — Other Ambulatory Visit: Payer: Self-pay | Admitting: Internal Medicine

## 2019-06-25 DIAGNOSIS — Z992 Dependence on renal dialysis: Secondary | ICD-10-CM | POA: Diagnosis not present

## 2019-06-25 DIAGNOSIS — Z23 Encounter for immunization: Secondary | ICD-10-CM | POA: Diagnosis not present

## 2019-06-25 DIAGNOSIS — N186 End stage renal disease: Secondary | ICD-10-CM | POA: Diagnosis not present

## 2019-06-25 DIAGNOSIS — N2581 Secondary hyperparathyroidism of renal origin: Secondary | ICD-10-CM | POA: Diagnosis not present

## 2019-06-25 DIAGNOSIS — E118 Type 2 diabetes mellitus with unspecified complications: Secondary | ICD-10-CM | POA: Diagnosis not present

## 2019-06-27 DIAGNOSIS — N186 End stage renal disease: Secondary | ICD-10-CM | POA: Diagnosis not present

## 2019-06-27 DIAGNOSIS — N2581 Secondary hyperparathyroidism of renal origin: Secondary | ICD-10-CM | POA: Diagnosis not present

## 2019-06-27 DIAGNOSIS — Z23 Encounter for immunization: Secondary | ICD-10-CM | POA: Diagnosis not present

## 2019-06-27 DIAGNOSIS — Z992 Dependence on renal dialysis: Secondary | ICD-10-CM | POA: Diagnosis not present

## 2019-06-27 DIAGNOSIS — E118 Type 2 diabetes mellitus with unspecified complications: Secondary | ICD-10-CM | POA: Diagnosis not present

## 2019-06-30 ENCOUNTER — Ambulatory Visit: Payer: Self-pay

## 2019-06-30 DIAGNOSIS — N186 End stage renal disease: Secondary | ICD-10-CM

## 2019-06-30 DIAGNOSIS — N2581 Secondary hyperparathyroidism of renal origin: Secondary | ICD-10-CM | POA: Diagnosis not present

## 2019-06-30 DIAGNOSIS — Z992 Dependence on renal dialysis: Secondary | ICD-10-CM

## 2019-06-30 DIAGNOSIS — Z23 Encounter for immunization: Secondary | ICD-10-CM | POA: Diagnosis not present

## 2019-06-30 DIAGNOSIS — E1122 Type 2 diabetes mellitus with diabetic chronic kidney disease: Secondary | ICD-10-CM

## 2019-06-30 DIAGNOSIS — E118 Type 2 diabetes mellitus with unspecified complications: Secondary | ICD-10-CM | POA: Diagnosis not present

## 2019-07-01 NOTE — Patient Instructions (Signed)
Social Worker Visit Information  Goals we discussed today:  Goals Addressed            This Visit's Progress   . "I need resources to help me develope a long term care plan for my dad"       Daughter stated [place] (see longtitudinal plan of care for additional care plan information)  Current Barriers:  [name] Knowledge Deficits related to Long Term Caregiver Options . Lacks caregiver support.  . Chronic Disease Management support and education needs related to CHF, DM II, Essential Hypertension, ESRD on HD, Glaucoma  Nurse Case Manager Clinical Goal(s):  [name] Over the next 90 days, patient will work with the embedded BSW to address needs related to Resources for long term caregiver options  CCM SW Interventions Completed 06/30/2019 with daughter [name] . Successful outbound call to the patients daughter to determine goal progression and assist with care coordination needs . Determined [name] has yet to speak with a case worker at [facility] due to long hold times o Encouraged [name] to enter a call back number when prompted to allow a case worker to return her call when they are available o Discussed barriers to longer wait times due to less staff in the office and more working from home due to [place] 19 pandemic . Informed by [name] she has recently spoken with the patient about the need for a long term plan due to continued decline in vision o Determined the patient is very adamant he does not want placement o Discussed barrier in identifying other alternatives without Medicaid approval for PCS services . Rosamaria Lints SW would outreach over the next 60 days to assist with on going care coordination needs . Encouraged [name] to contact SW with any acute SW needs prior to next scheduled outreach  CCM RN CM Interventions:  06/09/19 call completed with daughter [name]  . Determined patient and daughter [name] have started having brief conversations about plans for patient's housing and care once  he is unable to live alone . Determined [name] would like for information about long term care solutions so they may start planning for the future as her dad's vision impairment and or comorbidities may prevent him from living alone much longer  . Collaborated with embedded BSW [name] regarding daughter [name]'s request for resources for long term caregiver options . Discussed plans with patient for ongoing care management follow up and provided patient with direct contact information for care management team  Patient Self Care Activities:  . Self administers medications as prescribed . Attends all scheduled provider appointments . Performs ADL's independently . Performs IADL's independently  Please see past updates related to this goal by clicking on the "Past Updates" button in the selected goal          Follow Up Plan: SW will follow up with patient by phone over the next 60 days.   [name], BSW, CDP Social Worker, Certified Dementia Practitioner [facility] [phone]

## 2019-07-01 NOTE — Chronic Care Management (AMB) (Addendum)
Chronic Care Management    Social Work Follow Up Note  06/30/2019 Name: Elijah White MRN: [id number] DOB: 22-Jun-1940  Elijah White is a 79 y.o. year old male who is a primary care patient of [name], MD. The CCM team was consulted for assistance with care coordination.   Review of patient status, including review of consultants reports, other relevant assessments, and collaboration with appropriate care team members and the patient's provider was performed as part of comprehensive patient evaluation and provision of chronic care management services.    SDOH (Social Determinants of Health) assessments performed: No    Outpatient Encounter Medications as of 06/30/2019  Medication Sig  . acetaminophen (TYLENOL) 500 MG tablet Take 1,000 mg by mouth every 6 (six) hours as needed for headache (pain).  [name] atorvastatin (LIPITOR) 10 MG tablet TAKE 1 TABLET BY MOUTH EVERY DAY  . BD PEN NEEDLE NANO U/F 32G X 4 MM MISC USE SUBCUTANEOUS FOUR TIMES A DAY  . bimatoprost (LUMIGAN) 0.01 % SOLN Place 1 drop into the right eye at bedtime.  . blood glucose meter kit and supplies KIT Dispense based on patient and insurance preference. Use up to four times daily as directed. (FOR ICD-9 250.00, 250.01). For QAC - HS accuchecks.  . brimonidine (ALPHAGAN) 0.15 % ophthalmic solution Place 1 drop into the right eye 3 (three) times daily.   . calcitRIOL (ROCALTROL) 0.5 MCG capsule Take 0.5 mcg by mouth daily.  . carvedilol (COREG) 3.125 MG tablet TAKE 1 TABLET(3.125 MG) BY MOUTH TWICE DAILY WITH A MEAL  . dorzolamide-timolol (COSOPT) 22.3-6.8 MG/ML ophthalmic solution Place 1 drop into the right eye 2 (two) times daily.  [name] esomeprazole (NEXIUM) 40 MG capsule TAKE 1 CAPSULE BY MOUTH EVERY DAY  . furosemide (LASIX) 80 MG tablet TAKE 1 TABLET BY MOUTH EVERY MORNING AND HALF TABLET BY MOUTH EVERY EVENING  . gabapentin (NEURONTIN) 100 MG capsule TAKE 1 CAPSULE(100 MG) BY MOUTH AT BEDTIME  . hydrALAZINE  (APRESOLINE) 25 MG tablet TAKE 1 TABLET(25 MG) BY MOUTH TWICE DAILY  . isosorbide mononitrate (IMDUR) 30 MG 24 hr tablet TAKE 1 TABLET(30 MG) BY MOUTH DAILY  . Lancets (ONETOUCH DELICA PLUS [id number]) MISC USE AS DIRECTED UP TO FOUR TIMES A DAY  . ONETOUCH VERIO test strip USE AS DIRECTED TO TEST FOUR TIMES A DAY  . pilocarpine (PILOCAR) 4 % ophthalmic solution Place 1 drop into the right eye 3 (three) times daily.   No facility-administered encounter medications on file as of 06/30/2019.     Goals Addressed            This Visit's Progress   . "I need resources to help me develope a long term care plan for my dad"       Daughter stated [place] (see longtitudinal plan of care for additional care plan information)  Current Barriers:  [name] Knowledge Deficits related to Long Term Caregiver Options . Lacks caregiver support.  . Chronic Disease Management support and education needs related to CHF, DM II, Essential Hypertension, ESRD on HD, Glaucoma  Nurse Case Manager Clinical Goal(s):  [name] Over the next 90 days, patient will work with the embedded BSW to address needs related to Resources for long term caregiver options  CCM SW Interventions Completed 06/30/2019 with daughter [name] . Successful outbound call to the patients daughter to determine goal progression and assist with care coordination needs . Determined [name] has yet to speak with a case worker at [facility] due  to long hold times o Encouraged [name] to enter a call back number when prompted to allow a case worker to return her call when they are available o Discussed barriers to longer wait times due to less staff in the office and more working from home due to [place] 19 pandemic . Informed by [name] she has recently spoken with the patient about the need for a long term plan due to continued decline in vision o Determined the patient is very adamant he does not want placement o Discussed barrier in identifying other alternatives  without Medicaid approval for PCS services . Rosamaria Lints SW would outreach over the next 60 days to assist with on going care coordination needs . Encouraged [name] to contact SW with any acute SW needs prior to next scheduled outreach  CCM RN CM Interventions:  06/09/19 call completed with daughter [name]  . Determined patient and daughter [name] have started having brief conversations about plans for patient's housing and care once he is unable to live alone . Determined [name] would like for information about long term care solutions so they may start planning for the future as her dad's vision impairment and or comorbidities may prevent him from living alone much longer  . Collaborated with embedded BSW [name] regarding daughter [name]'s request for resources for long term caregiver options . Discussed plans with patient for ongoing care management follow up and provided patient with direct contact information for care management team  Patient Self Care Activities:  . Self administers medications as prescribed . Attends all scheduled provider appointments . Performs ADL's independently . Performs IADL's independently  Please see past updates related to this goal by clicking on the "Past Updates" button in the selected goal          Follow Up Plan: SW will follow up with patient by phone over the next 60 days.   [name], BSW, CDP Social Worker, Certified Dementia Practitioner [place] [facility] [phone]  Total time spent performing care coordination and/or care management activities with the patient by phone or face to face = 10 minutes.

## 2019-07-02 ENCOUNTER — Ambulatory Visit: Payer: Medicare Other | Admitting: Internal Medicine

## 2019-07-02 DIAGNOSIS — N186 End stage renal disease: Secondary | ICD-10-CM | POA: Diagnosis not present

## 2019-07-02 DIAGNOSIS — Z992 Dependence on renal dialysis: Secondary | ICD-10-CM | POA: Diagnosis not present

## 2019-07-02 DIAGNOSIS — N2581 Secondary hyperparathyroidism of renal origin: Secondary | ICD-10-CM | POA: Diagnosis not present

## 2019-07-02 DIAGNOSIS — E118 Type 2 diabetes mellitus with unspecified complications: Secondary | ICD-10-CM | POA: Diagnosis not present

## 2019-07-02 DIAGNOSIS — Z23 Encounter for immunization: Secondary | ICD-10-CM | POA: Diagnosis not present

## 2019-07-03 DIAGNOSIS — Z992 Dependence on renal dialysis: Secondary | ICD-10-CM | POA: Diagnosis not present

## 2019-07-03 DIAGNOSIS — N186 End stage renal disease: Secondary | ICD-10-CM | POA: Diagnosis not present

## 2019-07-03 DIAGNOSIS — I129 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 kidney disease, or unspecified chronic kidney disease: Secondary | ICD-10-CM | POA: Diagnosis not present

## 2019-07-04 DIAGNOSIS — N186 End stage renal disease: Secondary | ICD-10-CM | POA: Diagnosis not present

## 2019-07-04 DIAGNOSIS — N2581 Secondary hyperparathyroidism of renal origin: Secondary | ICD-10-CM | POA: Diagnosis not present

## 2019-07-04 DIAGNOSIS — Z992 Dependence on renal dialysis: Secondary | ICD-10-CM | POA: Diagnosis not present

## 2019-07-04 DIAGNOSIS — D509 Iron deficiency anemia, unspecified: Secondary | ICD-10-CM | POA: Diagnosis not present

## 2019-07-04 DIAGNOSIS — D631 Anemia in chronic kidney disease: Secondary | ICD-10-CM | POA: Diagnosis not present

## 2019-07-07 ENCOUNTER — Telehealth: Payer: Self-pay

## 2019-07-07 ENCOUNTER — Other Ambulatory Visit: Payer: Self-pay

## 2019-07-07 ENCOUNTER — Ambulatory Visit (INDEPENDENT_AMBULATORY_CARE_PROVIDER_SITE_OTHER): Payer: Medicare Other

## 2019-07-07 DIAGNOSIS — I5032 Chronic diastolic (congestive) heart failure: Secondary | ICD-10-CM | POA: Diagnosis not present

## 2019-07-07 DIAGNOSIS — E1122 Type 2 diabetes mellitus with diabetic chronic kidney disease: Secondary | ICD-10-CM | POA: Diagnosis not present

## 2019-07-07 DIAGNOSIS — Z992 Dependence on renal dialysis: Secondary | ICD-10-CM | POA: Diagnosis not present

## 2019-07-07 DIAGNOSIS — N2581 Secondary hyperparathyroidism of renal origin: Secondary | ICD-10-CM | POA: Diagnosis not present

## 2019-07-07 DIAGNOSIS — I10 Essential (primary) hypertension: Secondary | ICD-10-CM | POA: Diagnosis not present

## 2019-07-07 DIAGNOSIS — N186 End stage renal disease: Secondary | ICD-10-CM | POA: Diagnosis not present

## 2019-07-07 DIAGNOSIS — H401133 Primary open-angle glaucoma, bilateral, severe stage: Secondary | ICD-10-CM

## 2019-07-07 DIAGNOSIS — D631 Anemia in chronic kidney disease: Secondary | ICD-10-CM | POA: Diagnosis not present

## 2019-07-07 DIAGNOSIS — D509 Iron deficiency anemia, unspecified: Secondary | ICD-10-CM | POA: Diagnosis not present

## 2019-07-07 DIAGNOSIS — N185 Chronic kidney disease, stage 5: Secondary | ICD-10-CM

## 2019-07-08 NOTE — Patient Instructions (Signed)
Visit Information  Goals Addressed    . To provide education and support to improve Self Health management of Diabetes       CARE PLAN ENTRY (see longtitudinal plan of care for additional care plan information)  Current Barriers:  [name] Knowledge Deficits related to disease process and Self Health management of DM . Lacks caregiver support.  . Chronic Disease Management support and education needs related to CHF, DMII, ESRD on HD, Glaucoma  Nurse Case Manager Clinical Goal(s):  [name] Over the next 90 days, patient will work with the [facility] CM and PCP to address needs related to disease education and support to improve Self Health management of DM  CCM RN CM Interventions:  07/08/19 call completed with daughter Elijah White  . Evaluation of current treatment plan related to Diabetes Mellitus and patient's adherence to plan as established by provider . Provided education to patient re: A1c has decreased to 6.3 obtained on 03/26/19 . Determined patient is adhering to his ADA/Renal diet and Elijah White feels Elijah White is Self Managing his DM pretty well at this time . Discussed sending a few additional printed materials for patient and daughter Elijah White to review related Diabetes and Kidney Disease . Discussed plans with patient for ongoing care management follow up and provided patient with direct contact information for care management team . Provided patient with printed educational materials related to Your Diabetes Care Schedule; Diabetes and Kidney Disease: What to Eat?   Patient Self Care Activities:  . Self administers medications as prescribed . Attends all scheduled provider appointments . Calls pharmacy for medication refills . Performs ADL's independently . Performs IADL's independently . Calls provider office for new concerns or questions  Please see past updates related to this goal by clicking on the "Past Updates" button in the selected goal        Patient verbalizes understanding of  instructions provided today.   Telephone follow up appointment with care management team member scheduled for: 08/07/19  Barb Merino, RN, BSN, CCM Care Management Coordinator [facility]  Direct Phone: [phone]

## 2019-07-08 NOTE — Chronic Care Management (AMB) (Signed)
Chronic Care Management   Follow Up Note   07/08/2019 Name: Elijah White MRN: [id number] DOB: 1940-06-06  Referred by: [name], MD Reason for referral : Chronic Care Management (FU RN Call )   Elijah White is a 79 y.o. year old male who is a primary care patient of [name], MD. The CCM team was consulted for assistance with chronic disease management and care coordination needs.    Review of patient status, including review of consultants reports, relevant laboratory and other test results, and collaboration with appropriate care team members and the patient's provider was performed as part of comprehensive patient evaluation and provision of chronic care management services.    SDOH (Social Determinants of Health) assessments performed: Yes - No Acute Challenges Identified at this time. See Care Plan activities for detailed interventions related to [place])   Placed outbound RN CM follow up call to assess for CCM needs.     Outpatient Encounter Medications as of 07/07/2019  Medication Sig  . acetaminophen (TYLENOL) 500 MG tablet Take 1,000 mg by mouth every 6 (six) hours as needed for headache (pain).  [name] atorvastatin (LIPITOR) 10 MG tablet TAKE 1 TABLET BY MOUTH EVERY DAY  . BD PEN NEEDLE NANO U/F 32G X 4 MM MISC USE SUBCUTANEOUS FOUR TIMES A DAY  . bimatoprost (LUMIGAN) 0.01 % SOLN Place 1 drop into the right eye at bedtime.  . blood glucose meter kit and supplies KIT Dispense based on patient and insurance preference. Use up to four times daily as directed. (FOR ICD-9 250.00, 250.01). For QAC - HS accuchecks.  . brimonidine (ALPHAGAN) 0.15 % ophthalmic solution Place 1 drop into the right eye 3 (three) times daily.   . calcitRIOL (ROCALTROL) 0.5 MCG capsule Take 0.5 mcg by mouth daily.  . carvedilol (COREG) 3.125 MG tablet TAKE 1 TABLET(3.125 MG) BY MOUTH TWICE DAILY WITH A MEAL  . dorzolamide-timolol (COSOPT) 22.3-6.8 MG/ML ophthalmic solution Place 1 drop into the  right eye 2 (two) times daily.  [name] esomeprazole (NEXIUM) 40 MG capsule TAKE 1 CAPSULE BY MOUTH EVERY DAY  . furosemide (LASIX) 80 MG tablet TAKE 1 TABLET BY MOUTH EVERY MORNING AND HALF TABLET BY MOUTH EVERY EVENING  . gabapentin (NEURONTIN) 100 MG capsule TAKE 1 CAPSULE(100 MG) BY MOUTH AT BEDTIME  . hydrALAZINE (APRESOLINE) 25 MG tablet TAKE 1 TABLET(25 MG) BY MOUTH TWICE DAILY  . isosorbide mononitrate (IMDUR) 30 MG 24 hr tablet TAKE 1 TABLET(30 MG) BY MOUTH DAILY  . Lancets (ONETOUCH DELICA PLUS [id number]) MISC USE AS DIRECTED UP TO FOUR TIMES A DAY  . ONETOUCH VERIO test strip USE AS DIRECTED TO TEST FOUR TIMES A DAY  . pilocarpine (PILOCAR) 4 % ophthalmic solution Place 1 drop into the right eye 3 (three) times daily.   No facility-administered encounter medications on file as of 07/07/2019.     Objective:  Lab Results  Component Value Date   HGBA1C 6.3 (H) 03/25/2018   HGBA1C 6.5 (H) 02/19/2018   HGBA1C 7.1 (H) 09/02/2017   Lab Results  Component Value Date   MICROALBUR 150 02/19/2018   CREATININE 6.68 (H) 03/30/2018   BP Readings from Last 3 Encounters:  03/03/19 138/62  03/03/19 138/62  10/28/18 (!) 170/82    Goals Addressed    . To provide education and support to improve Self Health management of Diabetes       CARE PLAN ENTRY (see longtitudinal plan of care for additional care plan information)  Current Barriers:  .  Knowledge Deficits related to disease process and Self Health management of DM . Lacks caregiver support.  . Chronic Disease Management support and education needs related to CHF, DMII, ESRD on HD, Glaucoma  Nurse Case Manager Clinical Goal(s):  [name] Over the next 90 days, patient will work with the [facility] CM and PCP to address needs related to disease education and support to improve Self Health management of DM  CCM RN CM Interventions:  07/08/19 call completed with daughter Elijah White  . Evaluation of current treatment plan related to Diabetes Mellitus  and patient's adherence to plan as established by provider . Provided education to patient re: A1c has decreased to 6.3 obtained on 03/26/19 . Determined patient is adhering to his ADA/Renal diet and Elijah White feels Elijah White is Self Managing his DM pretty well at this time . Discussed sending a few additional printed materials for patient and daughter Elijah White to review related Diabetes and Kidney Disease . Discussed plans with patient for ongoing care management follow up and provided patient with direct contact information for care management team . Provided patient with printed educational materials related to Your Diabetes Care Schedule; Diabetes and Kidney Disease: What to Eat?   Patient Self Care Activities:  . Self administers medications as prescribed . Attends all scheduled provider appointments . Calls pharmacy for medication refills . Performs ADL's independently . Performs IADL's independently . Calls provider office for new concerns or questions  Please see past updates related to this goal by clicking on the "Past Updates" button in the selected goal        Plan:   Telephone follow up appointment with care management team member scheduled for: 08/07/19  Barb Merino, RN, BSN, CCM Care Management Coordinator [facility]  Direct Phone: [phone]

## 2019-07-09 DIAGNOSIS — N2581 Secondary hyperparathyroidism of renal origin: Secondary | ICD-10-CM | POA: Diagnosis not present

## 2019-07-09 DIAGNOSIS — Z992 Dependence on renal dialysis: Secondary | ICD-10-CM | POA: Diagnosis not present

## 2019-07-09 DIAGNOSIS — D509 Iron deficiency anemia, unspecified: Secondary | ICD-10-CM | POA: Diagnosis not present

## 2019-07-09 DIAGNOSIS — N186 End stage renal disease: Secondary | ICD-10-CM | POA: Diagnosis not present

## 2019-07-09 DIAGNOSIS — D631 Anemia in chronic kidney disease: Secondary | ICD-10-CM | POA: Diagnosis not present

## 2019-07-11 DIAGNOSIS — Z992 Dependence on renal dialysis: Secondary | ICD-10-CM | POA: Diagnosis not present

## 2019-07-11 DIAGNOSIS — D631 Anemia in chronic kidney disease: Secondary | ICD-10-CM | POA: Diagnosis not present

## 2019-07-11 DIAGNOSIS — D509 Iron deficiency anemia, unspecified: Secondary | ICD-10-CM | POA: Diagnosis not present

## 2019-07-11 DIAGNOSIS — N186 End stage renal disease: Secondary | ICD-10-CM | POA: Diagnosis not present

## 2019-07-11 DIAGNOSIS — N2581 Secondary hyperparathyroidism of renal origin: Secondary | ICD-10-CM | POA: Diagnosis not present

## 2019-07-13 DIAGNOSIS — D509 Iron deficiency anemia, unspecified: Secondary | ICD-10-CM | POA: Insufficient documentation

## 2019-07-14 DIAGNOSIS — N2581 Secondary hyperparathyroidism of renal origin: Secondary | ICD-10-CM | POA: Diagnosis not present

## 2019-07-14 DIAGNOSIS — D509 Iron deficiency anemia, unspecified: Secondary | ICD-10-CM | POA: Diagnosis not present

## 2019-07-14 DIAGNOSIS — D631 Anemia in chronic kidney disease: Secondary | ICD-10-CM | POA: Diagnosis not present

## 2019-07-14 DIAGNOSIS — Z992 Dependence on renal dialysis: Secondary | ICD-10-CM | POA: Diagnosis not present

## 2019-07-14 DIAGNOSIS — N186 End stage renal disease: Secondary | ICD-10-CM | POA: Diagnosis not present

## 2019-07-16 DIAGNOSIS — N2581 Secondary hyperparathyroidism of renal origin: Secondary | ICD-10-CM | POA: Diagnosis not present

## 2019-07-16 DIAGNOSIS — N186 End stage renal disease: Secondary | ICD-10-CM | POA: Diagnosis not present

## 2019-07-16 DIAGNOSIS — Z992 Dependence on renal dialysis: Secondary | ICD-10-CM | POA: Diagnosis not present

## 2019-07-16 DIAGNOSIS — D631 Anemia in chronic kidney disease: Secondary | ICD-10-CM | POA: Diagnosis not present

## 2019-07-16 DIAGNOSIS — D509 Iron deficiency anemia, unspecified: Secondary | ICD-10-CM | POA: Diagnosis not present

## 2019-07-18 DIAGNOSIS — N2581 Secondary hyperparathyroidism of renal origin: Secondary | ICD-10-CM | POA: Diagnosis not present

## 2019-07-18 DIAGNOSIS — D509 Iron deficiency anemia, unspecified: Secondary | ICD-10-CM | POA: Diagnosis not present

## 2019-07-18 DIAGNOSIS — Z992 Dependence on renal dialysis: Secondary | ICD-10-CM | POA: Diagnosis not present

## 2019-07-18 DIAGNOSIS — N186 End stage renal disease: Secondary | ICD-10-CM | POA: Diagnosis not present

## 2019-07-18 DIAGNOSIS — D631 Anemia in chronic kidney disease: Secondary | ICD-10-CM | POA: Diagnosis not present

## 2019-07-21 DIAGNOSIS — N2581 Secondary hyperparathyroidism of renal origin: Secondary | ICD-10-CM | POA: Diagnosis not present

## 2019-07-21 DIAGNOSIS — Z992 Dependence on renal dialysis: Secondary | ICD-10-CM | POA: Diagnosis not present

## 2019-07-21 DIAGNOSIS — D631 Anemia in chronic kidney disease: Secondary | ICD-10-CM | POA: Diagnosis not present

## 2019-07-21 DIAGNOSIS — N186 End stage renal disease: Secondary | ICD-10-CM | POA: Diagnosis not present

## 2019-07-21 DIAGNOSIS — D509 Iron deficiency anemia, unspecified: Secondary | ICD-10-CM | POA: Diagnosis not present

## 2019-07-23 DIAGNOSIS — N2581 Secondary hyperparathyroidism of renal origin: Secondary | ICD-10-CM | POA: Diagnosis not present

## 2019-07-23 DIAGNOSIS — Z992 Dependence on renal dialysis: Secondary | ICD-10-CM | POA: Diagnosis not present

## 2019-07-23 DIAGNOSIS — N186 End stage renal disease: Secondary | ICD-10-CM | POA: Diagnosis not present

## 2019-07-23 DIAGNOSIS — D631 Anemia in chronic kidney disease: Secondary | ICD-10-CM | POA: Diagnosis not present

## 2019-07-23 DIAGNOSIS — D509 Iron deficiency anemia, unspecified: Secondary | ICD-10-CM | POA: Diagnosis not present

## 2019-07-25 DIAGNOSIS — N186 End stage renal disease: Secondary | ICD-10-CM | POA: Diagnosis not present

## 2019-07-25 DIAGNOSIS — Z992 Dependence on renal dialysis: Secondary | ICD-10-CM | POA: Diagnosis not present

## 2019-07-25 DIAGNOSIS — D631 Anemia in chronic kidney disease: Secondary | ICD-10-CM | POA: Diagnosis not present

## 2019-07-25 DIAGNOSIS — D509 Iron deficiency anemia, unspecified: Secondary | ICD-10-CM | POA: Diagnosis not present

## 2019-07-25 DIAGNOSIS — N2581 Secondary hyperparathyroidism of renal origin: Secondary | ICD-10-CM | POA: Diagnosis not present

## 2019-07-28 DIAGNOSIS — N186 End stage renal disease: Secondary | ICD-10-CM | POA: Diagnosis not present

## 2019-07-28 DIAGNOSIS — Z992 Dependence on renal dialysis: Secondary | ICD-10-CM | POA: Diagnosis not present

## 2019-07-28 DIAGNOSIS — D631 Anemia in chronic kidney disease: Secondary | ICD-10-CM | POA: Diagnosis not present

## 2019-07-28 DIAGNOSIS — N2581 Secondary hyperparathyroidism of renal origin: Secondary | ICD-10-CM | POA: Diagnosis not present

## 2019-07-28 DIAGNOSIS — D509 Iron deficiency anemia, unspecified: Secondary | ICD-10-CM | POA: Diagnosis not present

## 2019-07-30 DIAGNOSIS — D509 Iron deficiency anemia, unspecified: Secondary | ICD-10-CM | POA: Diagnosis not present

## 2019-07-30 DIAGNOSIS — Z992 Dependence on renal dialysis: Secondary | ICD-10-CM | POA: Diagnosis not present

## 2019-07-30 DIAGNOSIS — N2581 Secondary hyperparathyroidism of renal origin: Secondary | ICD-10-CM | POA: Diagnosis not present

## 2019-07-30 DIAGNOSIS — D631 Anemia in chronic kidney disease: Secondary | ICD-10-CM | POA: Diagnosis not present

## 2019-07-30 DIAGNOSIS — N186 End stage renal disease: Secondary | ICD-10-CM | POA: Diagnosis not present

## 2019-08-01 DIAGNOSIS — D631 Anemia in chronic kidney disease: Secondary | ICD-10-CM | POA: Diagnosis not present

## 2019-08-01 DIAGNOSIS — Z992 Dependence on renal dialysis: Secondary | ICD-10-CM | POA: Diagnosis not present

## 2019-08-01 DIAGNOSIS — D509 Iron deficiency anemia, unspecified: Secondary | ICD-10-CM | POA: Diagnosis not present

## 2019-08-01 DIAGNOSIS — N186 End stage renal disease: Secondary | ICD-10-CM | POA: Diagnosis not present

## 2019-08-01 DIAGNOSIS — N2581 Secondary hyperparathyroidism of renal origin: Secondary | ICD-10-CM | POA: Diagnosis not present

## 2019-08-03 DIAGNOSIS — N186 End stage renal disease: Secondary | ICD-10-CM | POA: Diagnosis not present

## 2019-08-03 DIAGNOSIS — I129 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 kidney disease, or unspecified chronic kidney disease: Secondary | ICD-10-CM | POA: Diagnosis not present

## 2019-08-03 DIAGNOSIS — Z992 Dependence on renal dialysis: Secondary | ICD-10-CM | POA: Diagnosis not present

## 2019-08-04 DIAGNOSIS — N2581 Secondary hyperparathyroidism of renal origin: Secondary | ICD-10-CM | POA: Diagnosis not present

## 2019-08-04 DIAGNOSIS — Z992 Dependence on renal dialysis: Secondary | ICD-10-CM | POA: Diagnosis not present

## 2019-08-04 DIAGNOSIS — N186 End stage renal disease: Secondary | ICD-10-CM | POA: Diagnosis not present

## 2019-08-04 DIAGNOSIS — D509 Iron deficiency anemia, unspecified: Secondary | ICD-10-CM | POA: Diagnosis not present

## 2019-08-06 DIAGNOSIS — Z992 Dependence on renal dialysis: Secondary | ICD-10-CM | POA: Diagnosis not present

## 2019-08-06 DIAGNOSIS — N2581 Secondary hyperparathyroidism of renal origin: Secondary | ICD-10-CM | POA: Diagnosis not present

## 2019-08-06 DIAGNOSIS — D509 Iron deficiency anemia, unspecified: Secondary | ICD-10-CM | POA: Diagnosis not present

## 2019-08-06 DIAGNOSIS — N186 End stage renal disease: Secondary | ICD-10-CM | POA: Diagnosis not present

## 2019-08-08 DIAGNOSIS — D509 Iron deficiency anemia, unspecified: Secondary | ICD-10-CM | POA: Diagnosis not present

## 2019-08-08 DIAGNOSIS — N186 End stage renal disease: Secondary | ICD-10-CM | POA: Diagnosis not present

## 2019-08-08 DIAGNOSIS — N2581 Secondary hyperparathyroidism of renal origin: Secondary | ICD-10-CM | POA: Diagnosis not present

## 2019-08-08 DIAGNOSIS — Z992 Dependence on renal dialysis: Secondary | ICD-10-CM | POA: Diagnosis not present

## 2019-08-11 DIAGNOSIS — Z992 Dependence on renal dialysis: Secondary | ICD-10-CM | POA: Diagnosis not present

## 2019-08-11 DIAGNOSIS — N186 End stage renal disease: Secondary | ICD-10-CM | POA: Diagnosis not present

## 2019-08-11 DIAGNOSIS — N2581 Secondary hyperparathyroidism of renal origin: Secondary | ICD-10-CM | POA: Diagnosis not present

## 2019-08-11 DIAGNOSIS — D509 Iron deficiency anemia, unspecified: Secondary | ICD-10-CM | POA: Diagnosis not present

## 2019-08-12 ENCOUNTER — Other Ambulatory Visit: Payer: Self-pay

## 2019-08-12 ENCOUNTER — Encounter: Payer: Self-pay | Admitting: Podiatry

## 2019-08-12 ENCOUNTER — Ambulatory Visit: Payer: Medicare Other | Admitting: Podiatry

## 2019-08-12 VITALS — BP 146/72 | HR 88

## 2019-08-12 DIAGNOSIS — E1142 Type 2 diabetes mellitus with diabetic polyneuropathy: Secondary | ICD-10-CM | POA: Diagnosis not present

## 2019-08-12 DIAGNOSIS — M79674 Pain in right toe(s): Secondary | ICD-10-CM

## 2019-08-12 DIAGNOSIS — M2041 Other hammer toe(s) (acquired), right foot: Secondary | ICD-10-CM

## 2019-08-12 DIAGNOSIS — B351 Tinea unguium: Secondary | ICD-10-CM | POA: Diagnosis not present

## 2019-08-12 DIAGNOSIS — M2042 Other hammer toe(s) (acquired), left foot: Secondary | ICD-10-CM

## 2019-08-12 DIAGNOSIS — M79675 Pain in left toe(s): Secondary | ICD-10-CM | POA: Diagnosis not present

## 2019-08-12 NOTE — Patient Instructions (Addendum)
Diabetes Mellitus and Foot Care Foot care is an important part of your health, especially when you have diabetes. Diabetes may cause you to have problems because of poor blood flow (circulation) to your feet and legs, which can cause your skin to:  Become thinner and drier.  Break more easily.  Heal more slowly.  Peel and crack. You may also have nerve damage (neuropathy) in your legs and feet, causing decreased feeling in them. This means that you may not notice minor injuries to your feet that could lead to more serious problems. Noticing and addressing any potential problems early is the best way to prevent future foot problems. How to care for your feet Foot hygiene  Wash your feet daily with warm water and mild soap. Do not use hot water. Then, pat your feet and the areas between your toes until they are completely dry. Do not soak your feet as this can dry your skin.  Trim your toenails straight across. Do not dig under them or around the cuticle. File the edges of your nails with an emery board or nail file.  Apply a moisturizing lotion or petroleum jelly to the skin on your feet and to dry, brittle toenails. Use lotion that does not contain alcohol and is unscented. Do not apply lotion between your toes. Shoes and socks  Wear clean socks or stockings every day. Make sure they are not too tight. Do not wear knee-high stockings since they may decrease blood flow to your legs.  Wear shoes that fit properly and have enough cushioning. Always look in your shoes before you put them on to be sure there are no objects inside.  To break in new shoes, wear them for just a few hours a day. This prevents injuries on your feet. Wounds, scrapes, corns, and calluses  Check your feet daily for blisters, cuts, bruises, sores, and redness. If you cannot see the bottom of your feet, use a mirror or ask someone for help.  Do not cut corns or calluses or try to remove them with medicine.  If you  find a minor scrape, cut, or break in the skin on your feet, keep it and the skin around it clean and dry. You may clean these areas with mild soap and water. Do not clean the area with peroxide, alcohol, or iodine.  If you have a wound, scrape, corn, or callus on your foot, look at it several times a day to make sure it is healing and not infected. Check for: ? Redness, swelling, or pain. ? Fluid or blood. ? Warmth. ? Pus or a bad smell. General instructions  Do not cross your legs. This may decrease blood flow to your feet.  Do not use heating pads or hot water bottles on your feet. They may burn your skin. If you have lost feeling in your feet or legs, you may not know this is happening until it is too late.  Protect your feet from hot and cold by wearing shoes, such as at the beach or on hot pavement.  Schedule a complete foot exam at least once a year (annually) or more often if you have foot problems. If you have foot problems, report any cuts, sores, or bruises to your health care provider immediately. Contact a health care provider if:  You have a medical condition that increases your risk of infection and you have any cuts, sores, or bruises on your feet.  You have an injury that is not   healing.  You have redness on your legs or feet.  You feel burning or tingling in your legs or feet.  You have pain or cramps in your legs and feet.  Your legs or feet are numb.  Your feet always feel cold.  You have pain around a toenail. Get help right away if:  You have a wound, scrape, corn, or callus on your foot and: ? You have pain, swelling, or redness that gets worse. ? You have fluid or blood coming from the wound, scrape, corn, or callus. ? Your wound, scrape, corn, or callus feels warm to the touch. ? You have pus or a bad smell coming from the wound, scrape, corn, or callus. ? You have a fever. ? You have a red line going up your leg. Summary  Check your feet every day  for cuts, sores, red spots, swelling, and blisters.  Moisturize feet and legs daily.  Wear shoes that fit properly and have enough cushioning.  If you have foot problems, report any cuts, sores, or bruises to your health care provider immediately.  Schedule a complete foot exam at least once a year (annually) or more often if you have foot problems. This information is not intended to replace advice given to you by your health care provider. Make sure you discuss any questions you have with your health care provider. Document Revised: 11/12/2018 Document Reviewed: 03/23/2016 Elsevier Patient Education  2020 [employer]  Onychomycosis/Fungal Toenails  WHAT IS IT? An infection that lies within the keratin of your nail plate that is caused by a fungus.  WHY ME? Fungal infections affect all ages, sexes, races, and creeds.  There may be many factors that predispose you to a fungal infection such as age, coexisting medical conditions such as diabetes, or an autoimmune disease; stress, medications, fatigue, genetics, etc.  Bottom line: fungus thrives in a warm, moist environment and your shoes offer such a location.  IS IT CONTAGIOUS? Theoretically, yes.  You do not want to share shoes, nail clippers or files with someone who has fungal toenails.  Walking around barefoot in the same room or sleeping in the same bed is unlikely to transfer the organism.  It is important to realize, however, that fungus can spread easily from one nail to the next on the same foot.  HOW DO WE TREAT THIS?  There are several ways to treat this condition.  Treatment may depend on many factors such as age, medications, pregnancy, liver and kidney conditions, etc.  It is best to ask your doctor which options are available to you.  5. No treatment.   Unlike many other medical concerns, you can live with this condition.  However for many people this can be a painful condition and may lead to ingrown toenails or a bacterial  infection.  It is recommended that you keep the nails cut short to help reduce the amount of fungal nail. 6. Topical treatment.  These range from herbal remedies to prescription strength nail lacquers.  About 40-50% effective, topicals require twice daily application for approximately 9 to 12 months or until an entirely new nail has grown out.  The most effective topicals are medical grade medications available through physicians offices. 7. Oral antifungal medications.  With an 80-90% cure rate, the most common oral medication requires 3 to 4 months of therapy and stays in your system for a year as the new nail grows out.  Oral antifungal medications do require blood work to make   sure it is a safe drug for you.  A liver function panel will be performed prior to starting the medication and after the first month of treatment.  It is important to have the blood work performed to avoid any harmful side effects.  In general, this medication safe but blood work is required. 8. Laser Therapy.  This treatment is performed by applying a specialized laser to the affected nail plate.  This therapy is noninvasive, fast, and non-painful.  It is not covered by insurance and is therefore, out of pocket.  The results have been very good with a 80-95% cure rate.  The [facility] is the only practice in the area to offer this therapy. Permanent Nail Avulsion.  Removing the entire nail so that a new nail will not grow back.   Hammer Toe  Hammer toe is a change in the shape (a deformity) of your toe. The deformity causes the middle joint of your toe to stay bent. This causes pain, especially when you are wearing shoes. Hammer toe starts gradually. At first, the toe can be straightened. Gradually over time, the deformity becomes stiff and permanent. Early treatments to keep the toe straight may relieve pain. As the deformity becomes stiff and permanent, surgery may be needed to straighten the toe. What are the  causes? Hammer toe is caused by abnormal bending of the toe joint that is closest to your foot. It happens gradually over time. This pulls on the muscles and connections (tendons) of the toe joint, making them weak and stiff. It is often related to wearing shoes that are too short or narrow and do not let your toes straighten. What increases the risk? You may be at greater risk for hammer toe if you:  Are male.  Are older.  Wear shoes that are too small.  Wear high-heeled shoes that pinch your toes.  Are a Engineer, mining.  Have a second toe that is longer than your big toe (first toe).  Injure your foot or toe.  Have arthritis.  Have a family history of hammer toe.  Have a nerve or muscle disorder. What are the signs or symptoms? The main symptoms of this condition are pain and deformity of the toe. The pain is worse when wearing shoes, walking, or running. Other symptoms may include:  Corns or calluses over the bent part of the toe or between the toes.  Redness and a burning feeling on the toe.  An open sore that forms on the top of the toe.  Not being able to straighten the toe. How is this diagnosed? This condition is diagnosed based on your symptoms and a physical exam. During the exam, your health care provider will try to straighten your toe to see how stiff the deformity is. You may also have tests, such as:  A blood test to check for rheumatoid arthritis.  An X-ray to show how severe the deformity is. How is this treated? Treatment for this condition will depend on how stiff the deformity is. Surgery is often needed. However, sometimes a hammer toe can be straightened without surgery. Treatments that do not involve surgery include:  Taping the toe into a straightened position.  Using pads and cushions to protect the toe (orthotics).  Wearing shoes that provide enough room for the toes.  Doing toe-stretching exercises at home.  Taking an NSAID to reduce pain  and swelling. If these treatments do not help or the toe cannot be straightened, surgery is  the next option. The most common surgeries used to straighten a hammer toe include:  Arthroplasty. In this procedure, part of the joint is removed, and that allows the toe to straighten.  Fusion. In this procedure, cartilage between the two bones of the joint is taken out and the bones are fused together into one longer bone.  Implantation. In this procedure, part of the bone is removed and replaced with an implant to let the toe move again.  Flexor tendon transfer. In this procedure, the tendons that curl the toes down (flexor tendons) are repositioned. Follow these instructions at home:  Take over-the-counter and prescription medicines only as told by your health care provider.  Do toe straightening and stretching exercises as told by your health care provider.  Keep all follow-up visits as told by your health care provider. This is important. How is this prevented?  Wear shoes that give your toes enough room and do not cause pain.  Do not wear high-heeled shoes. Contact a health care provider if:  Your pain gets worse.  Your toe becomes red or swollen.  You develop an open sore on your toe. This information is not intended to replace advice given to you by your health care provider. Make sure you discuss any questions you have with your health care provider. Document Revised: 02/01/2017 Document Reviewed: 06/15/2015 Elsevier Patient Education  [place].   Diabetic Neuropathy Diabetic neuropathy refers to nerve damage that is caused by diabetes (diabetes mellitus). Over time, people with diabetes can develop nerve damage throughout the body. There are several types of diabetic neuropathy:  Peripheral neuropathy. This is the most common type of diabetic neuropathy. It causes damage to nerves that carry signals between the spinal cord and other parts of the body (peripheral  nerves). This usually affects nerves in the feet and legs first, and may eventually affect the hands and arms. The damage affects the ability to sense touch or temperature.  Autonomic neuropathy. This type causes damage to nerves that control involuntary functions (autonomic nerves). These nerves carry signals that control: ? Heartbeat. ? Body temperature. ? Blood pressure. ? Urination. ? Digestion. ? Sweating. ? Sexual function. ? Response to changing blood sugar (glucose) levels.  Focal neuropathy. This type of nerve damage affects one area of the body, such as an arm, a leg, or the face. The injury may involve one nerve or a small group of nerves. Focal neuropathy can be painful and unpredictable, and occurs most often in older adults with diabetes. This often develops suddenly, but usually improves over time and does not cause long-term problems.  Proximal neuropathy. This type of nerve damage affects the nerves of the thighs, hips, buttocks, or legs. It causes severe pain, weakness, and muscle death (atrophy), usually in the thigh muscles. It is more common among older men and people who have type 2 diabetes. The length of recovery time may vary. What are the causes? Peripheral, autonomic, and focal neuropathies are caused by diabetes that is not well controlled with treatment. The cause of proximal neuropathy is not known, but it may be caused by inflammation related to uncontrolled blood glucose levels. What are the signs or symptoms? Peripheral neuropathy Peripheral neuropathy develops slowly over time. When the nerves of the feet and legs no longer work, you may experience:  Burning, stabbing, or aching pain in the legs or feet.  Pain or cramping in the legs or feet.  Loss of feeling (numbness) and inability to feel pressure  or pain in the feet. This can lead to: ? Thick calluses or sores on areas of constant pressure. ? Ulcers. ? Reduced ability to feel temperature  changes.  Foot deformities.  Muscle weakness.  Loss of balance or coordination. Autonomic neuropathy The symptoms of autonomic neuropathy vary depending on which nerves are affected. Symptoms may include:  Problems with digestion, such as: ? Nausea or vomiting. ? Poor appetite. ? Bloating. ? Diarrhea or constipation. ? Trouble swallowing. ? Losing weight without trying to.  Problems with the heart, blood and lungs, such as: ? Dizziness, especially when standing up. ? Fainting. ? Shortness of breath. ? Irregular heartbeat.  Bladder problems, such as: ? Trouble starting or stopping urination. ? Leaking urine. ? Trouble emptying the bladder. ? Urinary tract infections (UTIs).  Problems with other body functions, such as: ? Sweat. You may sweat too much or too little. ? Temperature. You might get hot easily. Or, you might feel cold more than usual. ? Sexual function. Men may not be able to get or maintain an erection. Women may have vaginal dryness and difficulty with arousal. Focal neuropathy Symptoms affect only one area of the body. Common symptoms include:  Numbness.  Tingling.  Burning pain.  Prickling feeling.  Very sensitive skin.  Weakness.  Inability to move (paralysis).  Muscle twitching.  Muscles getting smaller (wasting).  Poor coordination.  Double or blurred vision. Proximal neuropathy  Sudden, severe pain in the hip, thigh, or buttocks. Pain may spread from the back into the legs (sciatica).  Pain and numbness in the arms and legs.  Tingling.  Loss of bladder control or bowel control.  Weakness and wasting of thigh muscles.  Difficulty getting up from a seated position.  Abdominal swelling.  Unexplained weight loss. How is this diagnosed? Diagnosis usually involves reviewing your medical history and any symptoms you have. Diagnosis varies depending on the type of neuropathy your health care provider suspects. Peripheral  neuropathy Your health care provider will check areas that are affected by your nervous system (neurologic exam), such as your reflexes, how you move, and what you can feel. You may have other tests, such as:  Blood tests.  Removal and examination of fluid that surrounds the spinal cord (lumbar puncture).  CT scan.  MRI.  A test to check the nerves that control muscles (electromyogram, EMG).  Tests of how quickly messages pass through your nerves (nerve conduction velocity tests).  Removal of a small piece of nerve to be examined under a microscope (biopsy). Autonomic neuropathy You may have tests, such as:  Tests to measure your blood pressure and heart rate. This may include monitoring you while you are safely secured to an exam table that moves you from a lying position to an upright position (table tilt test).  Breathing tests to check your lungs.  Tests to check how food moves through the digestive system (gastric emptying tests).  Blood, sweat, or urine tests.  Ultrasound of your bladder.  Spinal fluid tests. Focal neuropathy This condition may be diagnosed with:  A neurologic exam.  CT scan.  MRI.  EMG.  Nerve conduction velocity tests. Proximal neuropathy There is no test to diagnose this type of neuropathy. You may have tests to rule out other possible causes of this type of neuropathy. Tests may include:  X-rays of your spine and lumbar region.  Lumbar puncture.  MRI. How is this treated? The goal of treatment is to keep nerve damage from getting worse. The most  important part of treatment is keeping your blood glucose level and your A1C level within your target range by following your diabetes management plan. Over time, maintaining lower blood glucose levels helps lessen symptoms. In some cases, you may need prescription pain medicine. Follow these instructions at home:  Lifestyle   Do not use any products that contain nicotine or tobacco, such as  cigarettes and e-cigarettes. If you need help quitting, ask your health care provider.  Be physically active every day. Include strength training and balance exercises.  Follow a healthy meal plan.  Work with your health care provider to manage your blood pressure. General instructions  Follow your diabetes management plan as directed. ? Check your blood glucose levels as directed by your health care provider. ? Keep your blood glucose in your target range as directed by your health care provider. ? Have your A1C level checked at least two times a year, or as often as told by your health care provider.  Take over the counter and prescription medicines only as told by your health care provider. This includes insulin and diabetes medicine.  Do not drive or use heavy machinery while taking prescription pain medicines.  Check your skin and feet every day for cuts, bruises, redness, blisters, or sores.  Keep all follow up visits as told by your health care provider. This is important. Contact a health care provider if:  You have burning, stabbing, or aching pain in your legs or feet.  You are unable to feel pressure or pain in your feet.  You develop problems with digestion, such as: ? Nausea. ? Vomiting. ? Bloating. ? Constipation. ? Diarrhea. ? Abdominal pain.  You have difficulty with urination, such as inability: ? To control when you urinate (incontinence). ? To completely empty the bladder (retention).  You have palpitations.  You feel dizzy, weak, or faint when you stand up. Get help right away if:  You cannot urinate.  You have sudden weakness or loss of coordination.  You have trouble speaking.  You have pain or pressure in your chest.  You have an irregular heart beat.  You have sudden inability to move a part of your body. Summary  Diabetic neuropathy refers to nerve damage that is caused by diabetes. It can affect nerves throughout the entire body,  causing numbness and pain in the arms, legs, digestive tract, heart, and other body systems.  Keep your blood glucose level and your blood pressure in your target range, as directed by your health care provider. This can help prevent neuropathy from getting worse.  Check your skin and feet every day for cuts, bruises, redness, blisters, or sores.  Do not use any products that contain nicotine or tobacco, such as cigarettes and e-cigarettes. If you need help quitting, ask your health care provider. This information is not intended to replace advice given to you by your health care provider. Make sure you discuss any questions you have with your health care provider. Document Revised: 04/03/2017 Document Reviewed: 03/26/2016 Elsevier Patient Education  2020 [employer].

## 2019-08-13 DIAGNOSIS — N2581 Secondary hyperparathyroidism of renal origin: Secondary | ICD-10-CM | POA: Diagnosis not present

## 2019-08-13 DIAGNOSIS — D509 Iron deficiency anemia, unspecified: Secondary | ICD-10-CM | POA: Diagnosis not present

## 2019-08-13 DIAGNOSIS — N186 End stage renal disease: Secondary | ICD-10-CM | POA: Diagnosis not present

## 2019-08-13 DIAGNOSIS — Z992 Dependence on renal dialysis: Secondary | ICD-10-CM | POA: Diagnosis not present

## 2019-08-14 NOTE — Progress Notes (Signed)
Subjective: Elijah White presents today referred by [name], MD for diabetic foot evaluation.  His daughter is present during today's visit and relates some of his history.  Patient relates 30 year history of diabetes.  Patient denies any history of foot wounds.  Patient does relate history of numbness to feet. Daughter states he takes gabapentin 100 mg before bedtime.  Today, patient c/o of painful, discolored, thick toenails which interfere with daily activities.  Pain is aggravated when wearing enclosed shoe gear.   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 02/20/2018   Vision loss of right eye 11/11/2017   Acute decompensated heart failure ([place]) 09/01/2017   Bilateral pseudophakia 07/11/2017   Choroidal detachment of left eye 07/11/2017   GERD (gastroesophageal reflux disease) 09/10/2016   HLD (hyperlipidemia) 09/10/2016   Dizziness 08/13/2016   Renal failure (ARF), acute on chronic ([place]) 08/13/2016   Essential hypertension 08/13/2016   Cataract, nuclear 10/29/2012   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   Type 2 diabetes mellitus with renal manifestations ([place]) 10/22/2006   BENIGN PROSTATIC HYPERTROPHY 10/22/2006   COLONIC POLYPS, HX OF 10/22/2006    Past Surgical History:  Procedure Laterality Date   AV FISTULA PLACEMENT Left 03/20/2018   Procedure: ARTERIOVENOUS (AV) FISTULA CREATION  ARM;  Surgeon: [name], MD;  Location: [facility];  Service: Vascular;  Laterality: Left;   [place] Left 05/21/2018   Procedure: LEFT BASILIC VEIN FISTULA SECOND STAGE;  Surgeon: [name], MD;  Location: [facility];  Service: Vascular;  Laterality: Left;   COLONOSCOPY     GLAUCOMA SURGERY  2019   multiple surgeries   KNEE ARTHROSCOPY     PROSTATE SURGERY      Current Outpatient Medications on File Prior to Visit  Medication Sig Dispense Refill   latanoprost (XALATAN) 0.005 % ophthalmic solution Place 1 drop into the right eye nightly.     acetaminophen (TYLENOL) 500 MG tablet Take 1,000 mg by mouth every 6 (six) hours as needed for headache (pain).     apraclonidine (IOPIDINE) 0.5 % ophthalmic solution SMARTSIG:1 Drop(s) Right Eye Every 12 Hours     atorvastatin (LIPITOR) 10 MG tablet TAKE 1 TABLET BY MOUTH EVERY DAY 90 tablet 0   AURYXIA 1 GM 210 MG(Fe) tablet Take 210 mg by mouth 3 (three) times daily.     B Complex-C-Folic Acid (DIALYVITE 670) 0.8 MG TABS Take 1 tablet by mouth daily.     BD PEN NEEDLE NANO U/F 32G X 4 MM MISC USE SUBCUTANEOUS FOUR TIMES A DAY     bimatoprost (LUMIGAN) 0.01 % SOLN Place 1 drop into the right eye at bedtime.     blood glucose meter kit and supplies KIT Dispense based on patient and insurance preference. Use up to four times daily as directed. (FOR  ICD-9 250.00, 250.01). For QAC - HS accuchecks. 1 each 1   brimonidine (ALPHAGAN) 0.15 % ophthalmic solution Place 1 drop into the right eye 3 (three) times daily.   11   calcitRIOL (ROCALTROL) 0.5 MCG capsule Take 0.5 mcg by mouth daily.     carvedilol (COREG) 3.125 MG tablet TAKE 1 TABLET(3.125 MG) BY MOUTH TWICE DAILY WITH A MEAL 180 tablet 1   dorzolamide (TRUSOPT) 2 % ophthalmic solution 1 drop daily.     dorzolamide-timolol (COSOPT) 22.3-6.8 MG/ML ophthalmic solution Place 1 drop into the right eye 2 (two) times daily.  11   esomeprazole  (NEXIUM) 40 MG capsule TAKE 1 CAPSULE BY MOUTH EVERY DAY 90 capsule 1   furosemide (LASIX) 80 MG tablet TAKE 1 TABLET BY MOUTH EVERY MORNING AND HALF TABLET BY MOUTH EVERY EVENING 90 tablet 1   gabapentin (NEURONTIN) 100 MG capsule TAKE 1 CAPSULE(100 MG) BY MOUTH AT BEDTIME 90 capsule 1   hydrALAZINE (APRESOLINE) 25 MG tablet TAKE 1 TABLET(25 MG) BY MOUTH TWICE DAILY 180 tablet 1   isosorbide mononitrate (IMDUR) 30 MG 24 hr tablet TAKE 1 TABLET(30 MG) BY MOUTH DAILY 90 tablet 1   Lancets (ONETOUCH DELICA PLUS [id number]) MISC USE AS DIRECTED UP TO FOUR TIMES A DAY     lidocaine-prilocaine (EMLA) cream APPLY SMALL AMOUNT TO ACCESS SITE (AVF) 30 - 60 MINUTES BEFORE DIALYSIS. COVER WITH OCCLUSIVE DRESSING ( SARAN WRAP)     ONETOUCH VERIO test strip USE AS DIRECTED TO TEST FOUR TIMES A DAY 100 each 3   pilocarpine (PILOCAR) 1 % ophthalmic solution 1 drop daily.     pilocarpine (PILOCAR) 4 % ophthalmic solution Place 1 drop into the right eye 3 (three) times daily.     No current facility-administered medications on file prior to visit.     No Known Allergies  Social History   Occupational History   Occupation: retired  Tobacco Use   Smoking status: Former Smoker    Packs/day: 0.50    Quit date: 03/23/2018    Years since quitting: 1.3   Smokeless tobacco: Never Used  Vaping Use   Vaping Use: Never used  Substance and Sexual Activity   Alcohol use: No   Drug use: No   Sexual activity: Not Currently    Family History  Problem Relation Age of Onset   CAD Mother     Immunization History  Administered Date(s) Administered   Influenza Whole 01/04/2000   Influenza, High Dose Seasonal PF 10/28/2018   Influenza-Unspecified 12/26/2017   Moderna SARS-COVID-2 Vaccination 04/17/2019    Review of systems: Positive Findings in bold print.  Constitutional:  chills, fatigue, fever, sweats, weight change Communication: Optometrist, sign Ecologist, hand writing,  iPad/Android device Head: headaches, head injury Eyes: changes in vision, eye pain, glaucoma, cataracts, macular degeneration, diplopia, glare, light sensitivity, eyeglasses or contacts, blindness in left eye Ears nose mouth throat: hearing impaired, hearing aids,  ringing in ears, deaf, sign language,  vertigo, nosebleeds,  rhinitis,  cold sores, snoring, swollen glands Cardiovascular: HTN, edema, arrhythmia, pacemaker in place, defibrillator in place, chest pain/tightness, chronic anticoagulation, blood clot, heart failure, MI Peripheral Vascular: leg cramps, varicose veins, blood clots, lymphedema, varicosities Respiratory:  difficulty breathing, denies congestion, SOB, wheezing, cough, emphysema Gastrointestinal: change in appetite or weight, abdominal pain, constipation, diarrhea, nausea, vomiting, vomiting blood, change in bowel habits, abdominal pain, jaundice, rectal bleeding, hemorrhoids, GERD Genitourinary:  nocturia,  pain on urination, polyuria,  blood in urine, Foley catheter, urinary urgency,  ESRD on hemodialysis Musculoskeletal: amputation, cramping, stiff joints, painful joints, decreased joint motion, fractures, OA, gout, hemiplegia, paraplegia, uses cane, wheelchair bound, uses walker, uses rollator Skin: +changes in toenails, color change, dryness, itching, mole changes,  rash, wound(s) Neurological: headaches, numbness in feet, paresthesias in feet, burning in feet, fainting,  seizures, change in speech,  headaches, memory problems/poor historian, cerebral palsy, weakness, paralysis, CVA, TIA Endocrine: diabetes, hypothyroidism, hyperthyroidism,  goiter, dry mouth, flushing, heat intolerance,  cold intolerance,  excessive thirst, denies polyuria,  nocturia Hematological:  easy bleeding, excessive bleeding, easy bruising, enlarged lymph nodes, on long term blood thinner, history of past transusions Allergy/immunological:  hives, eczema, frequent infections, multiple drug allergies,  seasonal allergies, transplant recipient, multiple food allergies Psychiatric:  anxiety, depression, mood disorder, suicidal ideations, hallucinations, insomnia  Objective: Vitals:   08/12/19 0945  BP: (!) 146/72  Pulse: 12    79 y.o. pleasant African American male WD, WN IN NAD. AAO X 3.  Vascular Examination: Capillary fill time to digits <3 seconds b/l lower extremities. Palpable DP pulses b/l. Palpable PT pulses b/l. Pedal hair present b/l. Skin temperature gradient within normal limits b/l. Nonpitting edema noted b/l LE.  Dermatological Examination: Pedal skin with normal turgor, texture and tone bilaterally. No open wounds bilaterally. No interdigital macerations bilaterally. Toenails 1-5 b/l elongated, discolored, dystrophic, thickened, crumbly with subungual debris and tenderness to dorsal palpation.  Musculoskeletal Examination: Normal muscle strength 5/5 to all lower extremity muscle groups bilaterally. No pain crepitus or joint limitation noted with ROM b/l. No gross bony deformities bilaterally. Hammertoes noted to the L 5th toe and R 5th toe. Utilizes walker for ambulation assistance.  Footwear Assessment: Does the patient wear appropriate shoes? Yes Does the patient need inserts/orthotics? No.  Neurological Examination: Pt has subjective symptoms of neuropathy. Protective sensation intact 5/5 intact bilaterally with 10g monofilament b/l. Vibratory sensation intact b/l. Proprioception intact bilaterally. Babinski reflex negative b/l. Clonus negative b/l.  Assessment: 1. Pain due to onychomycosis of toenails of both feet   2. Acquired hammertoes of both feet   3. Diabetic peripheral neuropathy associated with type 2 diabetes mellitus ([place])    Plan: -Examined patient. -Diabetic foot examination performed on today's visit. -Discussed diabetic foot care principles. Literature dispensed on today. -Toenails 1-5 b/l were debrided in length and girth with sterile nail nippers  and dremel without iatrogenic bleeding.  -Patient to report any pedal injuries to medical professional immediately. -Patient to continue soft, supportive shoe gear daily. -Patient/POA to call should there be question/concern in the interim.  Return in about 3 months (around 11/12/2019) for diabetic nail trim.

## 2019-08-15 DIAGNOSIS — Z992 Dependence on renal dialysis: Secondary | ICD-10-CM | POA: Diagnosis not present

## 2019-08-15 DIAGNOSIS — N186 End stage renal disease: Secondary | ICD-10-CM | POA: Diagnosis not present

## 2019-08-15 DIAGNOSIS — N2581 Secondary hyperparathyroidism of renal origin: Secondary | ICD-10-CM | POA: Diagnosis not present

## 2019-08-15 DIAGNOSIS — D509 Iron deficiency anemia, unspecified: Secondary | ICD-10-CM | POA: Diagnosis not present

## 2019-08-16 ENCOUNTER — Other Ambulatory Visit: Payer: Self-pay | Admitting: Internal Medicine

## 2019-08-18 DIAGNOSIS — D509 Iron deficiency anemia, unspecified: Secondary | ICD-10-CM | POA: Diagnosis not present

## 2019-08-18 DIAGNOSIS — N2581 Secondary hyperparathyroidism of renal origin: Secondary | ICD-10-CM | POA: Diagnosis not present

## 2019-08-18 DIAGNOSIS — N186 End stage renal disease: Secondary | ICD-10-CM | POA: Diagnosis not present

## 2019-08-18 DIAGNOSIS — Z992 Dependence on renal dialysis: Secondary | ICD-10-CM | POA: Diagnosis not present

## 2019-08-19 DIAGNOSIS — T82868A Thrombosis of vascular prosthetic devices, implants and grafts, initial encounter: Secondary | ICD-10-CM | POA: Diagnosis not present

## 2019-08-19 DIAGNOSIS — N186 End stage renal disease: Secondary | ICD-10-CM | POA: Diagnosis not present

## 2019-08-19 DIAGNOSIS — I871 Compression of vein: Secondary | ICD-10-CM | POA: Diagnosis not present

## 2019-08-19 DIAGNOSIS — Z992 Dependence on renal dialysis: Secondary | ICD-10-CM | POA: Diagnosis not present

## 2019-08-20 DIAGNOSIS — N2581 Secondary hyperparathyroidism of renal origin: Secondary | ICD-10-CM | POA: Diagnosis not present

## 2019-08-20 DIAGNOSIS — Z992 Dependence on renal dialysis: Secondary | ICD-10-CM | POA: Diagnosis not present

## 2019-08-20 DIAGNOSIS — N186 End stage renal disease: Secondary | ICD-10-CM | POA: Diagnosis not present

## 2019-08-20 DIAGNOSIS — D509 Iron deficiency anemia, unspecified: Secondary | ICD-10-CM | POA: Diagnosis not present

## 2019-08-22 DIAGNOSIS — N2581 Secondary hyperparathyroidism of renal origin: Secondary | ICD-10-CM | POA: Diagnosis not present

## 2019-08-22 DIAGNOSIS — Z992 Dependence on renal dialysis: Secondary | ICD-10-CM | POA: Diagnosis not present

## 2019-08-22 DIAGNOSIS — N186 End stage renal disease: Secondary | ICD-10-CM | POA: Diagnosis not present

## 2019-08-22 DIAGNOSIS — D509 Iron deficiency anemia, unspecified: Secondary | ICD-10-CM | POA: Diagnosis not present

## 2019-08-25 DIAGNOSIS — Z992 Dependence on renal dialysis: Secondary | ICD-10-CM | POA: Diagnosis not present

## 2019-08-25 DIAGNOSIS — N2581 Secondary hyperparathyroidism of renal origin: Secondary | ICD-10-CM | POA: Diagnosis not present

## 2019-08-25 DIAGNOSIS — D509 Iron deficiency anemia, unspecified: Secondary | ICD-10-CM | POA: Diagnosis not present

## 2019-08-25 DIAGNOSIS — N186 End stage renal disease: Secondary | ICD-10-CM | POA: Diagnosis not present

## 2019-08-27 ENCOUNTER — Ambulatory Visit (INDEPENDENT_AMBULATORY_CARE_PROVIDER_SITE_OTHER): Payer: Medicare Other

## 2019-08-27 ENCOUNTER — Telehealth: Payer: Self-pay

## 2019-08-27 DIAGNOSIS — N186 End stage renal disease: Secondary | ICD-10-CM | POA: Diagnosis not present

## 2019-08-27 DIAGNOSIS — E1122 Type 2 diabetes mellitus with diabetic chronic kidney disease: Secondary | ICD-10-CM | POA: Diagnosis not present

## 2019-08-27 DIAGNOSIS — N185 Chronic kidney disease, stage 5: Secondary | ICD-10-CM

## 2019-08-27 DIAGNOSIS — Z992 Dependence on renal dialysis: Secondary | ICD-10-CM | POA: Diagnosis not present

## 2019-08-27 DIAGNOSIS — N2581 Secondary hyperparathyroidism of renal origin: Secondary | ICD-10-CM | POA: Diagnosis not present

## 2019-08-27 DIAGNOSIS — D509 Iron deficiency anemia, unspecified: Secondary | ICD-10-CM | POA: Diagnosis not present

## 2019-08-27 NOTE — Patient Instructions (Signed)
Social Worker Visit Information  Goals we discussed today:  Goals Addressed            This Visit's Progress   . "I need resources to help me develope a long term care plan for my dad"       Daughter stated [place] (see longtitudinal plan of care for additional care plan information)  Current Barriers:  [name] Knowledge Deficits related to Long Term Caregiver Options . Lacks caregiver support.  . Chronic Disease Management support and education needs related to CHF, DM II, Essential Hypertension, ESRD on HD, Glaucoma  Nurse Case Manager Clinical Goal(s):  [name] Over the next 90 days, patient will work with the embedded BSW to address needs related to Resources for long term caregiver options  CCM SW Interventions Completed 08/27/2019 with daughter [name] . Completed follow up call with the patients daughter and caregiver to assess for ongoing care coordination needs . Determined the patient continues with dialysis Tuesday, Thursday, Saturday o Patient reported fatigue to daughter today but labs at HD seem stable o Patient also experiencing intermittent headache . Rosamaria Lints to contact PCP if fatigue and headaches persist o [name] indicates upcomming PCP appointment o Chart reviewed to confirm appointment scheduled for 6/30 with Dr. Baird Cancer . Discussed patient vision continues to worsen o Daughter still unsure what she would like to do for long term care plans o Scheduled follow up call over the next 6 weeks to review options for the patient o Encouraged [name] to contact SW sooner if needed  CCM RN CM Interventions:  06/09/19 call completed with daughter [name]  . Determined patient and daughter [name] have started having brief conversations about plans for patient's housing and care once he is unable to live alone . Determined [name] would like for information about long term care solutions so they may start planning for the future as her dad's vision impairment and or  comorbidities may prevent him from living alone much longer  . Collaborated with embedded BSW [name] regarding daughter [name]'s request for resources for long term caregiver options . Discussed plans with patient for ongoing care management follow up and provided patient with direct contact information for care management team  Patient Self Care Activities:  . Self administers medications as prescribed . Attends all scheduled provider appointments . Performs ADL's independently . Performs IADL's independently  Please see past updates related to this goal by clicking on the "Past Updates" button in the selected goal         Follow Up Plan: SW will follow up with patient by phone over the next 6 weeks.   [name], BSW, CDP Social Worker, Certified Dementia Practitioner [place] [facility] [place] [facility] [phone]

## 2019-08-27 NOTE — Chronic Care Management (AMB) (Signed)
Chronic Care Management    Social Work Follow Up Note  08/27/2019 Name: Elijah White MRN: [id number] DOB: [birth year]  Elijah White is a 79 y.o. year old male who is a primary care patient of [name], MD. The CCM team was consulted for assistance with care coordination.   Review of patient status, including review of consultants reports, other relevant assessments, and collaboration with appropriate care team members and the patient's provider was performed as part of comprehensive patient evaluation and provision of chronic care management services.    SDOH (Social Determinants of Health) assessments performed: No    Outpatient Encounter Medications as of 08/27/2019  Medication Sig  . acetaminophen (TYLENOL) 500 MG tablet Take 1,000 mg by mouth every 6 (six) hours as needed for headache (pain).  [name] apraclonidine (IOPIDINE) 0.5 % ophthalmic solution SMARTSIG:1 Drop(s) Right Eye Every 12 Hours  . atorvastatin (LIPITOR) 10 MG tablet TAKE 1 TABLET BY MOUTH EVERY DAY  . AURYXIA 1 GM 210 MG(Fe) tablet Take 210 mg by mouth 3 (three) times daily.  . B Complex-C-Folic Acid (DIALYVITE 824) 0.8 MG TABS Take 1 tablet by mouth daily.  . BD PEN NEEDLE NANO U/F 32G X 4 MM MISC USE SUBCUTANEOUS FOUR TIMES A DAY  . bimatoprost (LUMIGAN) 0.01 % SOLN Place 1 drop into the right eye at bedtime.  . blood glucose meter kit and supplies KIT Dispense based on patient and insurance preference. Use up to four times daily as directed. (FOR ICD-9 250.00, 250.01). For QAC - HS accuchecks.  . brimonidine (ALPHAGAN) 0.15 % ophthalmic solution Place 1 drop into the right eye 3 (three) times daily.   . calcitRIOL (ROCALTROL) 0.5 MCG capsule Take 0.5 mcg by mouth daily.  . carvedilol (COREG) 3.125 MG tablet TAKE 1 TABLET(3.125 MG) BY MOUTH TWICE DAILY WITH A MEAL  . dorzolamide (TRUSOPT) 2 % ophthalmic solution 1 drop daily.  . dorzolamide-timolol (COSOPT) 22.3-6.8 MG/ML ophthalmic solution Place 1 drop into the  right eye 2 (two) times daily.  [name] esomeprazole (NEXIUM) 40 MG capsule TAKE 1 CAPSULE BY MOUTH EVERY DAY  . furosemide (LASIX) 80 MG tablet TAKE 1 TABLET BY MOUTH EVERY MORNING AND HALF TABLET BY MOUTH EVERY EVENING  . gabapentin (NEURONTIN) 100 MG capsule TAKE 1 CAPSULE(100 MG) BY MOUTH AT BEDTIME  . hydrALAZINE (APRESOLINE) 25 MG tablet TAKE 1 TABLET(25 MG) BY MOUTH TWICE DAILY  . isosorbide mononitrate (IMDUR) 30 MG 24 hr tablet TAKE 1 TABLET(30 MG) BY MOUTH DAILY  . Lancets (ONETOUCH DELICA PLUS [id number]) MISC USE AS DIRECTED UP TO FOUR TIMES A DAY  . latanoprost (XALATAN) 0.005 % ophthalmic solution Place 1 drop into the right eye nightly.  . lidocaine-prilocaine (EMLA) cream APPLY SMALL AMOUNT TO ACCESS SITE (AVF) 30 - 60 MINUTES BEFORE DIALYSIS. COVER WITH OCCLUSIVE DRESSING ( SARAN WRAP)  . ONETOUCH VERIO test strip USE AS DIRECTED TO TEST FOUR TIMES A DAY  . pilocarpine (PILOCAR) 1 % ophthalmic solution 1 drop daily.  . pilocarpine (PILOCAR) 4 % ophthalmic solution Place 1 drop into the right eye 3 (three) times daily.   No facility-administered encounter medications on file as of 08/27/2019.     Goals Addressed            This Visit's Progress   . "I need resources to help me develope a long term care plan for my dad"       Daughter stated [place] (see longtitudinal plan of care for additional care  plan information)  Current Barriers:  [name] Knowledge Deficits related to Long Term Caregiver Options . Lacks caregiver support.  . Chronic Disease Management support and education needs related to CHF, DM II, Essential Hypertension, ESRD on HD, Glaucoma  Nurse Case Manager Clinical Goal(s):  [name] Over the next 90 days, patient will work with the embedded BSW to address needs related to Resources for long term caregiver options  CCM SW Interventions Completed 08/27/2019 with daughter Elijah White . Completed follow up call with the patients daughter and caregiver to assess for  ongoing care coordination needs . Determined the patient continues with dialysis Tuesday, Thursday, Saturday o Patient reported fatigue to daughter today but labs at HD seem stable o Patient also experiencing intermittent headache . Elijah White to contact PCP if fatigue and headaches persist o Elijah White indicates upcomming PCP appointment o Chart reviewed to confirm appointment scheduled for 6/30 with Dr. Baird Cancer . Discussed patient vision continues to worsen o Daughter still unsure what she would like to do for long term care plans o Scheduled follow up call over the next 6 weeks to review options for the patient o Encouraged Elijah White to contact SW sooner if needed  Patient Self Care Activities:  . Self administers medications as prescribed . Attends all scheduled provider appointments . Performs ADL's independently . Performs IADL's independently  Please see past updates related to this goal by clicking on the "Past Updates" button in the selected goal          Follow Up Plan: SW will follow up with patient by phone over the next 6 weeks.   [name], BSW, CDP Social Worker, Certified Dementia Practitioner [place] [facility] [place] [facility] [phone]  Total time spent performing care coordination and/or care management activities with the patient by phone or face to face = 20 minutes.

## 2019-08-29 DIAGNOSIS — Z992 Dependence on renal dialysis: Secondary | ICD-10-CM | POA: Diagnosis not present

## 2019-08-29 DIAGNOSIS — D509 Iron deficiency anemia, unspecified: Secondary | ICD-10-CM | POA: Diagnosis not present

## 2019-08-29 DIAGNOSIS — N2581 Secondary hyperparathyroidism of renal origin: Secondary | ICD-10-CM | POA: Diagnosis not present

## 2019-08-29 DIAGNOSIS — N186 End stage renal disease: Secondary | ICD-10-CM | POA: Diagnosis not present

## 2019-09-01 DIAGNOSIS — N186 End stage renal disease: Secondary | ICD-10-CM | POA: Diagnosis not present

## 2019-09-01 DIAGNOSIS — Z992 Dependence on renal dialysis: Secondary | ICD-10-CM | POA: Diagnosis not present

## 2019-09-01 DIAGNOSIS — D509 Iron deficiency anemia, unspecified: Secondary | ICD-10-CM | POA: Diagnosis not present

## 2019-09-01 DIAGNOSIS — N2581 Secondary hyperparathyroidism of renal origin: Secondary | ICD-10-CM | POA: Diagnosis not present

## 2019-09-02 ENCOUNTER — Ambulatory Visit (INDEPENDENT_AMBULATORY_CARE_PROVIDER_SITE_OTHER): Payer: Medicare Other | Admitting: Internal Medicine

## 2019-09-02 ENCOUNTER — Other Ambulatory Visit: Payer: Self-pay

## 2019-09-02 ENCOUNTER — Encounter: Payer: Self-pay | Admitting: Internal Medicine

## 2019-09-02 VITALS — BP 118/64 | HR 71 | Temp 98.0°F | Ht 64.0 in | Wt 170.2 lb

## 2019-09-02 DIAGNOSIS — N186 End stage renal disease: Secondary | ICD-10-CM | POA: Diagnosis not present

## 2019-09-02 DIAGNOSIS — Z992 Dependence on renal dialysis: Secondary | ICD-10-CM | POA: Diagnosis not present

## 2019-09-02 DIAGNOSIS — I130 Hypertensive heart and chronic kidney disease with heart failure and stage 1 through stage 4 chronic kidney disease, or unspecified chronic kidney disease: Secondary | ICD-10-CM | POA: Diagnosis not present

## 2019-09-02 DIAGNOSIS — I120 Hypertensive chronic kidney disease with stage 5 chronic kidney disease or end stage renal disease: Secondary | ICD-10-CM | POA: Diagnosis not present

## 2019-09-02 DIAGNOSIS — E1122 Type 2 diabetes mellitus with diabetic chronic kidney disease: Secondary | ICD-10-CM | POA: Diagnosis not present

## 2019-09-02 DIAGNOSIS — I129 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 kidney disease, or unspecified chronic kidney disease: Secondary | ICD-10-CM | POA: Diagnosis not present

## 2019-09-02 DIAGNOSIS — N185 Chronic kidney disease, stage 5: Secondary | ICD-10-CM

## 2019-09-02 DIAGNOSIS — Z79899 Other long term (current) drug therapy: Secondary | ICD-10-CM | POA: Diagnosis not present

## 2019-09-02 NOTE — Patient Instructions (Signed)
Diabetes Mellitus and Foot Care Foot care is an important part of your health, especially when you have diabetes. Diabetes may cause you to have problems because of poor blood flow (circulation) to your feet and legs, which can cause your skin to:  Become thinner and drier.  Break more easily.  Heal more slowly.  Peel and crack. You may also have nerve damage (neuropathy) in your legs and feet, causing decreased feeling in them. This means that you may not notice minor injuries to your feet that could lead to more serious problems. Noticing and addressing any potential problems early is the best way to prevent future foot problems. How to care for your feet Foot hygiene  Wash your feet daily with warm water and mild soap. Do not use hot water. Then, pat your feet and the areas between your toes until they are completely dry. Do not soak your feet as this can dry your skin.  Trim your toenails straight across. Do not dig under them or around the cuticle. File the edges of your nails with an emery board or nail file.  Apply a moisturizing lotion or petroleum jelly to the skin on your feet and to dry, brittle toenails. Use lotion that does not contain alcohol and is unscented. Do not apply lotion between your toes. Shoes and socks  Wear clean socks or stockings every day. Make sure they are not too tight. Do not wear knee-high stockings since they may decrease blood flow to your legs.  Wear shoes that fit properly and have enough cushioning. Always look in your shoes before you put them on to be sure there are no objects inside.  To break in new shoes, wear them for just a few hours a day. This prevents injuries on your feet. Wounds, scrapes, corns, and calluses  Check your feet daily for blisters, cuts, bruises, sores, and redness. If you cannot see the bottom of your feet, use a mirror or ask someone for help.  Do not cut corns or calluses or try to remove them with medicine.  If you  find a minor scrape, cut, or break in the skin on your feet, keep it and the skin around it clean and dry. You may clean these areas with mild soap and water. Do not clean the area with peroxide, alcohol, or iodine.  If you have a wound, scrape, corn, or callus on your foot, look at it several times a day to make sure it is healing and not infected. Check for: ? Redness, swelling, or pain. ? Fluid or blood. ? Warmth. ? Pus or a bad smell. General instructions  Do not cross your legs. This may decrease blood flow to your feet.  Do not use heating pads or hot water bottles on your feet. They may burn your skin. If you have lost feeling in your feet or legs, you may not know this is happening until it is too late.  Protect your feet from hot and cold by wearing shoes, such as at the beach or on hot pavement.  Schedule a complete foot exam at least once a year (annually) or more often if you have foot problems. If you have foot problems, report any cuts, sores, or bruises to your health care provider immediately. Contact a health care provider if:  You have a medical condition that increases your risk of infection and you have any cuts, sores, or bruises on your feet.  You have an injury that is not   healing.  You have redness on your legs or feet.  You feel burning or tingling in your legs or feet.  You have pain or cramps in your legs and feet.  Your legs or feet are numb.  Your feet always feel cold.  You have pain around a toenail. Get help right away if:  You have a wound, scrape, corn, or callus on your foot and: ? You have pain, swelling, or redness that gets worse. ? You have fluid or blood coming from the wound, scrape, corn, or callus. ? Your wound, scrape, corn, or callus feels warm to the touch. ? You have pus or a bad smell coming from the wound, scrape, corn, or callus. ? You have a fever. ? You have a red line going up your leg. Summary  Check your feet every day  for cuts, sores, red spots, swelling, and blisters.  Moisturize feet and legs daily.  Wear shoes that fit properly and have enough cushioning.  If you have foot problems, report any cuts, sores, or bruises to your health care provider immediately.  Schedule a complete foot exam at least once a year (annually) or more often if you have foot problems. This information is not intended to replace advice given to you by your health care provider. Make sure you discuss any questions you have with your health care provider. Document Revised: 11/12/2018 Document Reviewed: 03/23/2016 Elsevier Patient Education  2020 [employer]  

## 2019-09-03 ENCOUNTER — Telehealth: Payer: Self-pay

## 2019-09-03 DIAGNOSIS — N2581 Secondary hyperparathyroidism of renal origin: Secondary | ICD-10-CM | POA: Diagnosis not present

## 2019-09-03 DIAGNOSIS — Z992 Dependence on renal dialysis: Secondary | ICD-10-CM | POA: Diagnosis not present

## 2019-09-03 DIAGNOSIS — D509 Iron deficiency anemia, unspecified: Secondary | ICD-10-CM | POA: Diagnosis not present

## 2019-09-03 DIAGNOSIS — N186 End stage renal disease: Secondary | ICD-10-CM | POA: Diagnosis not present

## 2019-09-03 DIAGNOSIS — Z79899 Other long term (current) drug therapy: Secondary | ICD-10-CM | POA: Diagnosis not present

## 2019-09-03 DIAGNOSIS — D631 Anemia in chronic kidney disease: Secondary | ICD-10-CM | POA: Diagnosis not present

## 2019-09-03 DIAGNOSIS — E118 Type 2 diabetes mellitus with unspecified complications: Secondary | ICD-10-CM | POA: Diagnosis not present

## 2019-09-05 DIAGNOSIS — D509 Iron deficiency anemia, unspecified: Secondary | ICD-10-CM | POA: Diagnosis not present

## 2019-09-05 DIAGNOSIS — E118 Type 2 diabetes mellitus with unspecified complications: Secondary | ICD-10-CM | POA: Diagnosis not present

## 2019-09-05 DIAGNOSIS — Z79899 Other long term (current) drug therapy: Secondary | ICD-10-CM | POA: Insufficient documentation

## 2019-09-05 DIAGNOSIS — N2581 Secondary hyperparathyroidism of renal origin: Secondary | ICD-10-CM | POA: Diagnosis not present

## 2019-09-05 DIAGNOSIS — Z992 Dependence on renal dialysis: Secondary | ICD-10-CM | POA: Diagnosis not present

## 2019-09-05 DIAGNOSIS — N186 End stage renal disease: Secondary | ICD-10-CM | POA: Diagnosis not present

## 2019-09-05 DIAGNOSIS — D631 Anemia in chronic kidney disease: Secondary | ICD-10-CM | POA: Diagnosis not present

## 2019-09-08 DIAGNOSIS — E118 Type 2 diabetes mellitus with unspecified complications: Secondary | ICD-10-CM | POA: Diagnosis not present

## 2019-09-08 DIAGNOSIS — D631 Anemia in chronic kidney disease: Secondary | ICD-10-CM | POA: Diagnosis not present

## 2019-09-08 DIAGNOSIS — D509 Iron deficiency anemia, unspecified: Secondary | ICD-10-CM | POA: Diagnosis not present

## 2019-09-08 DIAGNOSIS — Z992 Dependence on renal dialysis: Secondary | ICD-10-CM | POA: Diagnosis not present

## 2019-09-08 DIAGNOSIS — Z79899 Other long term (current) drug therapy: Secondary | ICD-10-CM | POA: Diagnosis not present

## 2019-09-08 DIAGNOSIS — N186 End stage renal disease: Secondary | ICD-10-CM | POA: Diagnosis not present

## 2019-09-08 DIAGNOSIS — N2581 Secondary hyperparathyroidism of renal origin: Secondary | ICD-10-CM | POA: Diagnosis not present

## 2019-09-09 ENCOUNTER — Encounter: Payer: Self-pay | Admitting: Internal Medicine

## 2019-09-10 DIAGNOSIS — D631 Anemia in chronic kidney disease: Secondary | ICD-10-CM | POA: Diagnosis not present

## 2019-09-10 DIAGNOSIS — N2581 Secondary hyperparathyroidism of renal origin: Secondary | ICD-10-CM | POA: Diagnosis not present

## 2019-09-10 DIAGNOSIS — D509 Iron deficiency anemia, unspecified: Secondary | ICD-10-CM | POA: Diagnosis not present

## 2019-09-10 DIAGNOSIS — N186 End stage renal disease: Secondary | ICD-10-CM | POA: Diagnosis not present

## 2019-09-10 DIAGNOSIS — E118 Type 2 diabetes mellitus with unspecified complications: Secondary | ICD-10-CM | POA: Diagnosis not present

## 2019-09-10 DIAGNOSIS — Z992 Dependence on renal dialysis: Secondary | ICD-10-CM | POA: Diagnosis not present

## 2019-09-10 DIAGNOSIS — Z79899 Other long term (current) drug therapy: Secondary | ICD-10-CM | POA: Diagnosis not present

## 2019-09-11 LAB — BASIC METABOLIC PANEL
BUN: 34 — AB (ref 4–21)
Chloride: 95 — AB (ref 99–108)
Creatinine: 6.4 — AB (ref 0.6–1.3)
Glucose: 228
Sodium: 134 — AB (ref 137–147)

## 2019-09-11 LAB — CBC AND DIFFERENTIAL
HCT: 37 — AB (ref 41–53)
Hemoglobin: 12.3 — AB (ref 13.5–17.5)

## 2019-09-11 LAB — HEMOGLOBIN A1C: Hemoglobin A1C: 6.7

## 2019-09-11 LAB — LIPID PANEL: LDL Cholesterol: 103

## 2019-09-11 LAB — CBC: RBC: 3.48 — AB (ref 3.87–5.11)

## 2019-09-11 LAB — IRON,TIBC AND FERRITIN PANEL: UIBC: 143

## 2019-09-12 DIAGNOSIS — D631 Anemia in chronic kidney disease: Secondary | ICD-10-CM | POA: Diagnosis not present

## 2019-09-12 DIAGNOSIS — Z992 Dependence on renal dialysis: Secondary | ICD-10-CM | POA: Diagnosis not present

## 2019-09-12 DIAGNOSIS — N2581 Secondary hyperparathyroidism of renal origin: Secondary | ICD-10-CM | POA: Diagnosis not present

## 2019-09-12 DIAGNOSIS — D509 Iron deficiency anemia, unspecified: Secondary | ICD-10-CM | POA: Diagnosis not present

## 2019-09-12 DIAGNOSIS — Z79899 Other long term (current) drug therapy: Secondary | ICD-10-CM | POA: Diagnosis not present

## 2019-09-12 DIAGNOSIS — N186 End stage renal disease: Secondary | ICD-10-CM | POA: Diagnosis not present

## 2019-09-12 DIAGNOSIS — E118 Type 2 diabetes mellitus with unspecified complications: Secondary | ICD-10-CM | POA: Diagnosis not present

## 2019-09-15 DIAGNOSIS — N186 End stage renal disease: Secondary | ICD-10-CM | POA: Diagnosis not present

## 2019-09-15 DIAGNOSIS — N2581 Secondary hyperparathyroidism of renal origin: Secondary | ICD-10-CM | POA: Diagnosis not present

## 2019-09-15 DIAGNOSIS — Z992 Dependence on renal dialysis: Secondary | ICD-10-CM | POA: Diagnosis not present

## 2019-09-15 DIAGNOSIS — Z79899 Other long term (current) drug therapy: Secondary | ICD-10-CM | POA: Diagnosis not present

## 2019-09-15 DIAGNOSIS — D631 Anemia in chronic kidney disease: Secondary | ICD-10-CM | POA: Diagnosis not present

## 2019-09-15 DIAGNOSIS — E118 Type 2 diabetes mellitus with unspecified complications: Secondary | ICD-10-CM | POA: Diagnosis not present

## 2019-09-15 DIAGNOSIS — D509 Iron deficiency anemia, unspecified: Secondary | ICD-10-CM | POA: Diagnosis not present

## 2019-09-17 ENCOUNTER — Telehealth: Payer: Self-pay

## 2019-09-17 DIAGNOSIS — N186 End stage renal disease: Secondary | ICD-10-CM | POA: Diagnosis not present

## 2019-09-17 DIAGNOSIS — Z79899 Other long term (current) drug therapy: Secondary | ICD-10-CM | POA: Diagnosis not present

## 2019-09-17 DIAGNOSIS — E118 Type 2 diabetes mellitus with unspecified complications: Secondary | ICD-10-CM | POA: Diagnosis not present

## 2019-09-17 DIAGNOSIS — D509 Iron deficiency anemia, unspecified: Secondary | ICD-10-CM | POA: Diagnosis not present

## 2019-09-17 DIAGNOSIS — D631 Anemia in chronic kidney disease: Secondary | ICD-10-CM | POA: Diagnosis not present

## 2019-09-17 DIAGNOSIS — N2581 Secondary hyperparathyroidism of renal origin: Secondary | ICD-10-CM | POA: Diagnosis not present

## 2019-09-17 DIAGNOSIS — Z992 Dependence on renal dialysis: Secondary | ICD-10-CM | POA: Diagnosis not present

## 2019-09-17 NOTE — Telephone Encounter (Signed)
The pt's daughter [name] was notified that Dr Baird Cancer wanted to know if the pt can decrease his dose of esomeprazole to Monday - Friday only because over utilization of the med can cause osteoporosis and vitamin b12 malabsorption. The pt's daughter [name] said yes.

## 2019-09-19 DIAGNOSIS — N2581 Secondary hyperparathyroidism of renal origin: Secondary | ICD-10-CM | POA: Diagnosis not present

## 2019-09-19 DIAGNOSIS — D631 Anemia in chronic kidney disease: Secondary | ICD-10-CM | POA: Diagnosis not present

## 2019-09-19 DIAGNOSIS — D509 Iron deficiency anemia, unspecified: Secondary | ICD-10-CM | POA: Diagnosis not present

## 2019-09-19 DIAGNOSIS — E118 Type 2 diabetes mellitus with unspecified complications: Secondary | ICD-10-CM | POA: Diagnosis not present

## 2019-09-19 DIAGNOSIS — N186 End stage renal disease: Secondary | ICD-10-CM | POA: Diagnosis not present

## 2019-09-19 DIAGNOSIS — Z79899 Other long term (current) drug therapy: Secondary | ICD-10-CM | POA: Diagnosis not present

## 2019-09-19 DIAGNOSIS — Z992 Dependence on renal dialysis: Secondary | ICD-10-CM | POA: Diagnosis not present

## 2019-09-19 NOTE — Progress Notes (Signed)
This visit occurred during the SARS-CoV-2 public health emergency.  Safety protocols were in place, including screening questions prior to the visit, additional usage of staff PPE, and extensive cleaning of exam room while observing appropriate contact time as indicated for disinfecting solutions.  Subjective:     Patient ID: Elijah White , male    DOB: [birth year] , 79 y.o.   MRN: [id number]   Chief Complaint  Patient presents with   Diabetes   Hypertension    HPI  He presents today for diabetes check. He is accompanied by his daughter. He has not been seen in over six months. I saw him in Feb 2020 and he was seen by NP Dec 2020.  He is still living alone. He is legally blind. He is still on dialysis. His daughter checks on him regularly.   Diabetes He presents for his follow-up diabetic visit. He has type 2 diabetes mellitus. There are no hypoglycemic associated symptoms. There are no hypoglycemic complications. Risk factors for coronary artery disease include diabetes mellitus, dyslipidemia, male sex, hypertension and sedentary lifestyle.  Hypertension This is a chronic problem. The current episode started more than 1 year ago. The problem has been gradually improving since onset.     Past Medical History:  Diagnosis Date   Anemia    low iron   Arthritis    Cancer ([place]) ?2006   prostate   Chronic kidney disease    Stage 4   Diabetes mellitus without complication (HCC)    Elevated cholesterol with high triglycerides    GERD (gastroesophageal reflux disease)    Hypertension    Legally blind in left eye, as defined in [place]    has pinpoint vision in right eye     Family History  Problem Relation Age of Onset   CAD Mother      Current Outpatient Medications:    acetaminophen (TYLENOL) 500 MG tablet, Take 1,000 mg by mouth every 6 (six) hours as needed for headache (pain)., Disp: , Rfl:    apraclonidine (IOPIDINE) 0.5 % ophthalmic solution, SMARTSIG:1  Drop(s) Right Eye Every 12 Hours, Disp: , Rfl:    atorvastatin (LIPITOR) 10 MG tablet, TAKE 1 TABLET BY MOUTH EVERY DAY, Disp: 90 tablet, Rfl: 0   AURYXIA 1 GM 210 MG(Fe) tablet, Take 210 mg by mouth 3 (three) times daily., Disp: , Rfl:    B Complex-C-Folic Acid (DIALYVITE 975) 0.8 MG TABS, Take 1 tablet by mouth daily., Disp: , Rfl:    BD PEN NEEDLE NANO U/F 32G X 4 MM MISC, USE SUBCUTANEOUS FOUR TIMES A DAY, Disp: , Rfl:    bimatoprost (LUMIGAN) 0.01 % SOLN, Place 1 drop into the right eye at bedtime., Disp: , Rfl:    blood glucose meter kit and supplies KIT, Dispense based on patient and insurance preference. Use up to four times daily as directed. (FOR ICD-9 250.00, 250.01). For QAC - HS accuchecks., Disp: 1 each, Rfl: 1   brimonidine (ALPHAGAN) 0.15 % ophthalmic solution, Place 1 drop into the right eye 3 (three) times daily. , Disp: , Rfl: 11   carvedilol (COREG) 3.125 MG tablet, TAKE 1 TABLET(3.125 MG) BY MOUTH TWICE DAILY WITH A MEAL, Disp: 180 tablet, Rfl: 1   dorzolamide (TRUSOPT) 2 % ophthalmic solution, 1 drop daily., Disp: , Rfl:    dorzolamide-timolol (COSOPT) 22.3-6.8 MG/ML ophthalmic solution, Place 1 drop into the right eye 2 (two) times daily., Disp: , Rfl: 11   esomeprazole (NEXIUM) 40 MG capsule, TAKE  1 CAPSULE BY MOUTH EVERY DAY, Disp: 90 capsule, Rfl: 1   gabapentin (NEURONTIN) 100 MG capsule, TAKE 1 CAPSULE(100 MG) BY MOUTH AT BEDTIME, Disp: 90 capsule, Rfl: 1   hydrALAZINE (APRESOLINE) 25 MG tablet, TAKE 1 TABLET(25 MG) BY MOUTH TWICE DAILY, Disp: 180 tablet, Rfl: 1   isosorbide mononitrate (IMDUR) 30 MG 24 hr tablet, TAKE 1 TABLET(30 MG) BY MOUTH DAILY, Disp: 90 tablet, Rfl: 1   Lancets (ONETOUCH DELICA PLUS [id number]) MISC, USE AS DIRECTED UP TO FOUR TIMES A DAY, Disp: , Rfl:    ONETOUCH VERIO test strip, USE AS DIRECTED TO TEST FOUR TIMES A DAY, Disp: 100 each, Rfl: 3   pilocarpine (PILOCAR) 1 % ophthalmic solution, 1 drop daily., Disp: , Rfl:     pilocarpine (PILOCAR) 4 % ophthalmic solution, Place 1 drop into the right eye 3 (three) times daily., Disp: , Rfl:    No Known Allergies   Review of Systems  Constitutional: Negative.   Respiratory: Negative.   Cardiovascular: Negative.   Gastrointestinal: Negative.   Neurological: Negative.   Psychiatric/Behavioral: Negative.      Today's Vitals   09/02/19 1005  BP: 118/64  Pulse: 71  Temp: 98 F (36.7 C)  TempSrc: Oral  Weight: 170 lb 3.2 oz (77.2 kg)  Height: _0  (1.626 m)  PainSc: 0-No pain   Body mass index is 29.21 kg/m.   Objective:  Physical Exam Vitals and nursing note reviewed.  Constitutional:      Appearance: Normal appearance.  Cardiovascular:     Rate and Rhythm: Normal rate and regular rhythm.     Heart sounds: Normal heart sounds.  Pulmonary:     Effort: Pulmonary effort is normal.     Breath sounds: Normal breath sounds.  Musculoskeletal:     Right lower leg: 2+ Pitting Edema present.     Left lower leg: 2+ Pitting Edema present.  Skin:    General: Skin is warm.  Neurological:     General: No focal deficit present.     Mental Status: He is alert.  Psychiatric:        Mood and Affect: Mood normal.         Assessment And Plan:     1. 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 I will not draw labs here today. He prefers to get at dialysis. I will write out rx pad with orders. He and daughter are encouraged to give lab request to dialysis staff at next dialysis. Importance of dietary compliance was discussed with the patient.   2. 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 He will continue with current meds. I will also request records from [facility].   3. 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 Importance of salt restriction was discussed with the patient.   4. 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 Drug therapy     Patient was given opportunity to ask questions. Patient verbalized understanding of the plan and was able to repeat key elements of the plan. All questions were answered to their satisfaction.  Maximino Greenland, MD     THE PATIENT IS ENCOURAGED TO PRACTICE SOCIAL DISTANCING DUE TO THE COVID-19 PANDEMIC.

## 2019-09-22 DIAGNOSIS — N186 End stage renal disease: Secondary | ICD-10-CM | POA: Diagnosis not present

## 2019-09-22 DIAGNOSIS — E118 Type 2 diabetes mellitus with unspecified complications: Secondary | ICD-10-CM | POA: Diagnosis not present

## 2019-09-22 DIAGNOSIS — Z992 Dependence on renal dialysis: Secondary | ICD-10-CM | POA: Diagnosis not present

## 2019-09-22 DIAGNOSIS — N2581 Secondary hyperparathyroidism of renal origin: Secondary | ICD-10-CM | POA: Diagnosis not present

## 2019-09-22 DIAGNOSIS — D509 Iron deficiency anemia, unspecified: Secondary | ICD-10-CM | POA: Diagnosis not present

## 2019-09-22 DIAGNOSIS — Z79899 Other long term (current) drug therapy: Secondary | ICD-10-CM | POA: Diagnosis not present

## 2019-09-22 DIAGNOSIS — D631 Anemia in chronic kidney disease: Secondary | ICD-10-CM | POA: Diagnosis not present

## 2019-09-24 DIAGNOSIS — N186 End stage renal disease: Secondary | ICD-10-CM | POA: Diagnosis not present

## 2019-09-24 DIAGNOSIS — E118 Type 2 diabetes mellitus with unspecified complications: Secondary | ICD-10-CM | POA: Diagnosis not present

## 2019-09-24 DIAGNOSIS — Z992 Dependence on renal dialysis: Secondary | ICD-10-CM | POA: Diagnosis not present

## 2019-09-24 DIAGNOSIS — D509 Iron deficiency anemia, unspecified: Secondary | ICD-10-CM | POA: Diagnosis not present

## 2019-09-24 DIAGNOSIS — Z79899 Other long term (current) drug therapy: Secondary | ICD-10-CM | POA: Diagnosis not present

## 2019-09-24 DIAGNOSIS — N2581 Secondary hyperparathyroidism of renal origin: Secondary | ICD-10-CM | POA: Diagnosis not present

## 2019-09-24 DIAGNOSIS — D631 Anemia in chronic kidney disease: Secondary | ICD-10-CM | POA: Diagnosis not present

## 2019-09-26 DIAGNOSIS — D509 Iron deficiency anemia, unspecified: Secondary | ICD-10-CM | POA: Diagnosis not present

## 2019-09-26 DIAGNOSIS — Z79899 Other long term (current) drug therapy: Secondary | ICD-10-CM | POA: Diagnosis not present

## 2019-09-26 DIAGNOSIS — D631 Anemia in chronic kidney disease: Secondary | ICD-10-CM | POA: Diagnosis not present

## 2019-09-26 DIAGNOSIS — Z992 Dependence on renal dialysis: Secondary | ICD-10-CM | POA: Diagnosis not present

## 2019-09-26 DIAGNOSIS — N2581 Secondary hyperparathyroidism of renal origin: Secondary | ICD-10-CM | POA: Diagnosis not present

## 2019-09-26 DIAGNOSIS — E118 Type 2 diabetes mellitus with unspecified complications: Secondary | ICD-10-CM | POA: Diagnosis not present

## 2019-09-26 DIAGNOSIS — N186 End stage renal disease: Secondary | ICD-10-CM | POA: Diagnosis not present

## 2019-09-28 DIAGNOSIS — H401133 Primary open-angle glaucoma, bilateral, severe stage: Secondary | ICD-10-CM | POA: Diagnosis not present

## 2019-09-29 DIAGNOSIS — Z992 Dependence on renal dialysis: Secondary | ICD-10-CM | POA: Diagnosis not present

## 2019-09-29 DIAGNOSIS — D509 Iron deficiency anemia, unspecified: Secondary | ICD-10-CM | POA: Diagnosis not present

## 2019-09-29 DIAGNOSIS — N2581 Secondary hyperparathyroidism of renal origin: Secondary | ICD-10-CM | POA: Diagnosis not present

## 2019-09-29 DIAGNOSIS — E118 Type 2 diabetes mellitus with unspecified complications: Secondary | ICD-10-CM | POA: Diagnosis not present

## 2019-09-29 DIAGNOSIS — Z79899 Other long term (current) drug therapy: Secondary | ICD-10-CM | POA: Diagnosis not present

## 2019-09-29 DIAGNOSIS — N186 End stage renal disease: Secondary | ICD-10-CM | POA: Diagnosis not present

## 2019-09-29 DIAGNOSIS — D631 Anemia in chronic kidney disease: Secondary | ICD-10-CM | POA: Diagnosis not present

## 2019-10-01 DIAGNOSIS — Z992 Dependence on renal dialysis: Secondary | ICD-10-CM | POA: Diagnosis not present

## 2019-10-01 DIAGNOSIS — E118 Type 2 diabetes mellitus with unspecified complications: Secondary | ICD-10-CM | POA: Diagnosis not present

## 2019-10-01 DIAGNOSIS — N2581 Secondary hyperparathyroidism of renal origin: Secondary | ICD-10-CM | POA: Diagnosis not present

## 2019-10-01 DIAGNOSIS — D631 Anemia in chronic kidney disease: Secondary | ICD-10-CM | POA: Diagnosis not present

## 2019-10-01 DIAGNOSIS — Z79899 Other long term (current) drug therapy: Secondary | ICD-10-CM | POA: Diagnosis not present

## 2019-10-01 DIAGNOSIS — D509 Iron deficiency anemia, unspecified: Secondary | ICD-10-CM | POA: Diagnosis not present

## 2019-10-01 DIAGNOSIS — N186 End stage renal disease: Secondary | ICD-10-CM | POA: Diagnosis not present

## 2019-10-02 ENCOUNTER — Telehealth: Payer: Self-pay

## 2019-10-02 NOTE — Telephone Encounter (Signed)
  Chronic Care Management   Outreach Note  10/02/2019 Name: Elijah White MRN: [id number] DOB: [birth year]  Referred by: [name], MD Reason for referral : Care Coordination   An unsuccessful telephone outreach was attempted to the patients daughter to follow up on care coordination needs. The patient was referred to the case management team for assistance with care management and care coordination.   Follow Up Plan: A HIPPA compliant phone message was left  providing contact information and requesting a return call.  The care management team will reach out to the patient again over the next 14 days.   [name], BSW, CDP Social Worker, Certified Dementia Practitioner [place] [facility] [phone]

## 2019-10-03 DIAGNOSIS — I129 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 kidney disease, or unspecified chronic kidney disease: Secondary | ICD-10-CM | POA: Diagnosis not present

## 2019-10-03 DIAGNOSIS — Z79899 Other long term (current) drug therapy: Secondary | ICD-10-CM | POA: Diagnosis not present

## 2019-10-03 DIAGNOSIS — Z992 Dependence on renal dialysis: Secondary | ICD-10-CM | POA: Diagnosis not present

## 2019-10-03 DIAGNOSIS — E118 Type 2 diabetes mellitus with unspecified complications: Secondary | ICD-10-CM | POA: Diagnosis not present

## 2019-10-03 DIAGNOSIS — D631 Anemia in chronic kidney disease: Secondary | ICD-10-CM | POA: Diagnosis not present

## 2019-10-03 DIAGNOSIS — N186 End stage renal disease: Secondary | ICD-10-CM | POA: Diagnosis not present

## 2019-10-03 DIAGNOSIS — N2581 Secondary hyperparathyroidism of renal origin: Secondary | ICD-10-CM | POA: Diagnosis not present

## 2019-10-03 DIAGNOSIS — D509 Iron deficiency anemia, unspecified: Secondary | ICD-10-CM | POA: Diagnosis not present

## 2019-10-06 ENCOUNTER — Encounter: Payer: Self-pay | Admitting: Internal Medicine

## 2019-10-06 DIAGNOSIS — Z992 Dependence on renal dialysis: Secondary | ICD-10-CM | POA: Diagnosis not present

## 2019-10-06 DIAGNOSIS — E118 Type 2 diabetes mellitus with unspecified complications: Secondary | ICD-10-CM | POA: Diagnosis not present

## 2019-10-06 DIAGNOSIS — D631 Anemia in chronic kidney disease: Secondary | ICD-10-CM | POA: Diagnosis not present

## 2019-10-06 DIAGNOSIS — N2581 Secondary hyperparathyroidism of renal origin: Secondary | ICD-10-CM | POA: Diagnosis not present

## 2019-10-06 DIAGNOSIS — N186 End stage renal disease: Secondary | ICD-10-CM | POA: Diagnosis not present

## 2019-10-08 DIAGNOSIS — E118 Type 2 diabetes mellitus with unspecified complications: Secondary | ICD-10-CM | POA: Diagnosis not present

## 2019-10-08 DIAGNOSIS — D631 Anemia in chronic kidney disease: Secondary | ICD-10-CM | POA: Diagnosis not present

## 2019-10-08 DIAGNOSIS — N2581 Secondary hyperparathyroidism of renal origin: Secondary | ICD-10-CM | POA: Diagnosis not present

## 2019-10-08 DIAGNOSIS — Z992 Dependence on renal dialysis: Secondary | ICD-10-CM | POA: Diagnosis not present

## 2019-10-08 DIAGNOSIS — N186 End stage renal disease: Secondary | ICD-10-CM | POA: Diagnosis not present

## 2019-10-10 DIAGNOSIS — N2581 Secondary hyperparathyroidism of renal origin: Secondary | ICD-10-CM | POA: Diagnosis not present

## 2019-10-10 DIAGNOSIS — Z992 Dependence on renal dialysis: Secondary | ICD-10-CM | POA: Diagnosis not present

## 2019-10-10 DIAGNOSIS — E118 Type 2 diabetes mellitus with unspecified complications: Secondary | ICD-10-CM | POA: Diagnosis not present

## 2019-10-10 DIAGNOSIS — N186 End stage renal disease: Secondary | ICD-10-CM | POA: Diagnosis not present

## 2019-10-10 DIAGNOSIS — D631 Anemia in chronic kidney disease: Secondary | ICD-10-CM | POA: Diagnosis not present

## 2019-10-11 ENCOUNTER — Other Ambulatory Visit: Payer: Self-pay | Admitting: Internal Medicine

## 2019-10-12 NOTE — Telephone Encounter (Signed)
Gabapentin refill. Can you send for Dr. Baird [facility]? Thanks.

## 2019-10-13 DIAGNOSIS — N186 End stage renal disease: Secondary | ICD-10-CM | POA: Diagnosis not present

## 2019-10-13 DIAGNOSIS — N2581 Secondary hyperparathyroidism of renal origin: Secondary | ICD-10-CM | POA: Diagnosis not present

## 2019-10-13 DIAGNOSIS — D631 Anemia in chronic kidney disease: Secondary | ICD-10-CM | POA: Diagnosis not present

## 2019-10-13 DIAGNOSIS — Z992 Dependence on renal dialysis: Secondary | ICD-10-CM | POA: Diagnosis not present

## 2019-10-13 DIAGNOSIS — E118 Type 2 diabetes mellitus with unspecified complications: Secondary | ICD-10-CM | POA: Diagnosis not present

## 2019-10-14 ENCOUNTER — Telehealth: Payer: Self-pay

## 2019-10-15 ENCOUNTER — Ambulatory Visit: Payer: Medicare Other

## 2019-10-15 DIAGNOSIS — N2581 Secondary hyperparathyroidism of renal origin: Secondary | ICD-10-CM | POA: Diagnosis not present

## 2019-10-15 DIAGNOSIS — D631 Anemia in chronic kidney disease: Secondary | ICD-10-CM | POA: Diagnosis not present

## 2019-10-15 DIAGNOSIS — N186 End stage renal disease: Secondary | ICD-10-CM | POA: Diagnosis not present

## 2019-10-15 DIAGNOSIS — Z992 Dependence on renal dialysis: Secondary | ICD-10-CM | POA: Diagnosis not present

## 2019-10-15 DIAGNOSIS — E118 Type 2 diabetes mellitus with unspecified complications: Secondary | ICD-10-CM | POA: Diagnosis not present

## 2019-10-15 DIAGNOSIS — E1122 Type 2 diabetes mellitus with diabetic chronic kidney disease: Secondary | ICD-10-CM

## 2019-10-15 NOTE — Chronic Care Management (AMB) (Signed)
Chronic Care Management    Social Work Follow Up Note  10/15/2019 Name: Elijah White MRN: [id number] DOB: Apr 17, 1940  Elijah White is a 79 y.o. year old male who is a primary care patient of [name], MD. The CCM team was consulted for assistance with care coordination.   Review of patient status, including review of consultants reports, other relevant assessments, and collaboration with appropriate care team members and the patient's provider was performed as part of comprehensive patient evaluation and provision of chronic care management services.    SDOH (Social Determinants of Health) assessments performed: No    Outpatient Encounter Medications as of 10/15/2019  Medication Sig  . acetaminophen (TYLENOL) 500 MG tablet Take 1,000 mg by mouth every 6 (six) hours as needed for headache (pain).  [name] apraclonidine (IOPIDINE) 0.5 % ophthalmic solution SMARTSIG:1 Drop(s) Right Eye Every 12 Hours  . atorvastatin (LIPITOR) 10 MG tablet TAKE 1 TABLET BY MOUTH EVERY DAY  . AURYXIA 1 GM 210 MG(Fe) tablet Take 210 mg by mouth 3 (three) times daily.  . B Complex-C-Folic Acid (DIALYVITE 338) 0.8 MG TABS Take 1 tablet by mouth daily.  . BD PEN NEEDLE NANO U/F 32G X 4 MM MISC USE SUBCUTANEOUS FOUR TIMES A DAY  . bimatoprost (LUMIGAN) 0.01 % SOLN Place 1 drop into the right eye at bedtime.  . blood glucose meter kit and supplies KIT Dispense based on patient and insurance preference. Use up to four times daily as directed. (FOR ICD-9 250.00, 250.01). For QAC - HS accuchecks.  . brimonidine (ALPHAGAN) 0.15 % ophthalmic solution Place 1 drop into the right eye 3 (three) times daily.   . carvedilol (COREG) 3.125 MG tablet TAKE 1 TABLET(3.125 MG) BY MOUTH TWICE DAILY WITH A MEAL  . dorzolamide (TRUSOPT) 2 % ophthalmic solution 1 drop daily.  . dorzolamide-timolol (COSOPT) 22.3-6.8 MG/ML ophthalmic solution Place 1 drop into the right eye 2 (two) times daily.  [name] esomeprazole (NEXIUM) 40 MG capsule  TAKE 1 CAPSULE BY MOUTH EVERY DAY  . gabapentin (NEURONTIN) 100 MG capsule TAKE 1 CAPSULE(100 MG) BY MOUTH AT BEDTIME  . hydrALAZINE (APRESOLINE) 25 MG tablet TAKE 1 TABLET(25 MG) BY MOUTH TWICE DAILY  . isosorbide mononitrate (IMDUR) 30 MG 24 hr tablet TAKE 1 TABLET(30 MG) BY MOUTH DAILY  . Lancets (ONETOUCH DELICA PLUS [id number]) MISC USE AS DIRECTED UP TO FOUR TIMES A DAY  . ONETOUCH VERIO test strip USE AS DIRECTED TO TEST FOUR TIMES A DAY  . pilocarpine (PILOCAR) 1 % ophthalmic solution 1 drop daily.  . pilocarpine (PILOCAR) 4 % ophthalmic solution Place 1 drop into the right eye 3 (three) times daily.   No facility-administered encounter medications on file as of 10/15/2019.     Goals Addressed            This Visit's Progress   . COMPLETED: "I need resources to help me develope a long term care plan for my dad"       Daughter stated [place] (see longtitudinal plan of care for additional care plan information)  Current Barriers:  [name] Knowledge Deficits related to Long Term Caregiver Options . Lacks caregiver support.  . Chronic Disease Management support and education needs related to CHF, DM II, Essential Hypertension, ESRD on HD, Glaucoma  Nurse Case Manager Clinical Goal(s):  [name] Over the next 90 days, patient will work with the embedded BSW to address needs related to Resources for long term caregiver options  CCM SW Interventions  Completed 10/15/19 with [name] . Successful outbound call to the patients daughter/caregiver [name] . Determined the patient continues to remain the same with no declines to functional level o [name] plans to order a new color identifier to assist the patient in matching clothes due to current one not working well . Discussed patient remains in the home without current intention for placement . Patient continues to do well with dialysis . SW to perform goal closure at this time . Encouraged [name] to contact SW as needed with future resource  needs . Collaboration with RN Care Manager to inform of goal closure and SW plan to sign off at this time  Patient Self Care Activities:  . Self administers medications as prescribed . Attends all scheduled provider appointments . Performs ADL's independently . Performs IADL's independently  Please see past updates related to this goal by clicking on the "Past Updates" button in the selected goal          Follow Up Plan: No SW follow up planned at this time. The patient will remain active with RN Care Manager.   [name], BSW, CDP Social Worker, Certified Dementia Practitioner [place] [facility] [place] [facility] [phone]  Total time spent performing care coordination and/or care management activities with the patient by phone or face to face = 15 minutes.

## 2019-10-15 NOTE — Patient Instructions (Signed)
Social Worker Visit Information  Goals we discussed today:  Goals Addressed            This Visit's Progress   . COMPLETED: "I need resources to help me develope a long term care plan for my dad"       Daughter stated [place] (see longtitudinal plan of care for additional care plan information)  Current Barriers:  [name] Knowledge Deficits related to Long Term Caregiver Options . Lacks caregiver support.  . Chronic Disease Management support and education needs related to CHF, DM II, Essential Hypertension, ESRD on HD, Glaucoma  Nurse Case Manager Clinical Goal(s):  [name] Over the next 90 days, patient will work with the embedded BSW to address needs related to Resources for long term caregiver options  CCM SW Interventions Completed 10/15/19 with [name] . Successful outbound call to the patients daughter/caregiver [name] . Determined the patient continues to remain the same with no declines to functional level o [name] plans to order a new color identifier to assist the patient in matching clothes due to current one not working well . Discussed patient remains in the home without current intention for placement . Patient continues to do well with dialysis . SW to perform goal closure at this time . Encouraged [name] to contact SW as needed with future resource needs . Collaboration with RN Care Manager to inform of goal closure and SW plan to sign off at this time  Patient Self Care Activities:  . Self administers medications as prescribed . Attends all scheduled provider appointments . Performs ADL's independently . Performs IADL's independently  Please see past updates related to this goal by clicking on the "Past Updates" button in the selected goal          Follow Up Plan: No SW follow up planned at this time. The patient will remain active with RN Care Manager.   [name], BSW, CDP Social Worker, Certified Dementia Practitioner [place] [facility] [place]  [facility] [phone]

## 2019-10-16 ENCOUNTER — Telehealth: Payer: Self-pay

## 2019-10-17 DIAGNOSIS — D631 Anemia in chronic kidney disease: Secondary | ICD-10-CM | POA: Diagnosis not present

## 2019-10-17 DIAGNOSIS — Z992 Dependence on renal dialysis: Secondary | ICD-10-CM | POA: Diagnosis not present

## 2019-10-17 DIAGNOSIS — E118 Type 2 diabetes mellitus with unspecified complications: Secondary | ICD-10-CM | POA: Diagnosis not present

## 2019-10-17 DIAGNOSIS — N2581 Secondary hyperparathyroidism of renal origin: Secondary | ICD-10-CM | POA: Diagnosis not present

## 2019-10-17 DIAGNOSIS — N186 End stage renal disease: Secondary | ICD-10-CM | POA: Diagnosis not present

## 2019-10-20 DIAGNOSIS — D631 Anemia in chronic kidney disease: Secondary | ICD-10-CM | POA: Diagnosis not present

## 2019-10-20 DIAGNOSIS — Z992 Dependence on renal dialysis: Secondary | ICD-10-CM | POA: Diagnosis not present

## 2019-10-20 DIAGNOSIS — N186 End stage renal disease: Secondary | ICD-10-CM | POA: Diagnosis not present

## 2019-10-20 DIAGNOSIS — N2581 Secondary hyperparathyroidism of renal origin: Secondary | ICD-10-CM | POA: Diagnosis not present

## 2019-10-20 DIAGNOSIS — E118 Type 2 diabetes mellitus with unspecified complications: Secondary | ICD-10-CM | POA: Diagnosis not present

## 2019-10-22 DIAGNOSIS — D631 Anemia in chronic kidney disease: Secondary | ICD-10-CM | POA: Diagnosis not present

## 2019-10-22 DIAGNOSIS — N186 End stage renal disease: Secondary | ICD-10-CM | POA: Diagnosis not present

## 2019-10-22 DIAGNOSIS — N2581 Secondary hyperparathyroidism of renal origin: Secondary | ICD-10-CM | POA: Diagnosis not present

## 2019-10-22 DIAGNOSIS — Z992 Dependence on renal dialysis: Secondary | ICD-10-CM | POA: Diagnosis not present

## 2019-10-22 DIAGNOSIS — E118 Type 2 diabetes mellitus with unspecified complications: Secondary | ICD-10-CM | POA: Diagnosis not present

## 2019-10-24 DIAGNOSIS — D631 Anemia in chronic kidney disease: Secondary | ICD-10-CM | POA: Diagnosis not present

## 2019-10-24 DIAGNOSIS — Z992 Dependence on renal dialysis: Secondary | ICD-10-CM | POA: Diagnosis not present

## 2019-10-24 DIAGNOSIS — E118 Type 2 diabetes mellitus with unspecified complications: Secondary | ICD-10-CM | POA: Diagnosis not present

## 2019-10-24 DIAGNOSIS — N186 End stage renal disease: Secondary | ICD-10-CM | POA: Diagnosis not present

## 2019-10-24 DIAGNOSIS — N2581 Secondary hyperparathyroidism of renal origin: Secondary | ICD-10-CM | POA: Diagnosis not present

## 2019-10-27 DIAGNOSIS — D631 Anemia in chronic kidney disease: Secondary | ICD-10-CM | POA: Diagnosis not present

## 2019-10-27 DIAGNOSIS — N2581 Secondary hyperparathyroidism of renal origin: Secondary | ICD-10-CM | POA: Diagnosis not present

## 2019-10-27 DIAGNOSIS — E118 Type 2 diabetes mellitus with unspecified complications: Secondary | ICD-10-CM | POA: Diagnosis not present

## 2019-10-27 DIAGNOSIS — Z992 Dependence on renal dialysis: Secondary | ICD-10-CM | POA: Diagnosis not present

## 2019-10-27 DIAGNOSIS — N186 End stage renal disease: Secondary | ICD-10-CM | POA: Diagnosis not present

## 2019-10-28 ENCOUNTER — Telehealth: Payer: Self-pay

## 2019-10-28 NOTE — Telephone Encounter (Cosign Needed)
  Chronic Care Management   Outreach Note  10/28/2019 Name: Elijah White MRN: [id number] DOB: Jul 18, 1940  Referred by: [name], MD Reason for referral : Chronic Care Management (FU RN CM Call )   An unsuccessful telephone outreach was attempted today. The patient was referred to the case management team for assistance with care management and care coordination.   Follow Up Plan: Telephone follow up appointment with care management team member scheduled for: 12/04/19  Barb Merino, RN, BSN, CCM Care Management Coordinator [place] [facility]  Direct Phone: [phone]

## 2019-10-29 DIAGNOSIS — D631 Anemia in chronic kidney disease: Secondary | ICD-10-CM | POA: Diagnosis not present

## 2019-10-29 DIAGNOSIS — N186 End stage renal disease: Secondary | ICD-10-CM | POA: Diagnosis not present

## 2019-10-29 DIAGNOSIS — E118 Type 2 diabetes mellitus with unspecified complications: Secondary | ICD-10-CM | POA: Diagnosis not present

## 2019-10-29 DIAGNOSIS — Z992 Dependence on renal dialysis: Secondary | ICD-10-CM | POA: Diagnosis not present

## 2019-10-29 DIAGNOSIS — N2581 Secondary hyperparathyroidism of renal origin: Secondary | ICD-10-CM | POA: Diagnosis not present

## 2019-10-31 ENCOUNTER — Other Ambulatory Visit: Payer: Self-pay | Admitting: Internal Medicine

## 2019-10-31 ENCOUNTER — Other Ambulatory Visit: Payer: Self-pay | Admitting: Nurse Practitioner

## 2019-10-31 DIAGNOSIS — E118 Type 2 diabetes mellitus with unspecified complications: Secondary | ICD-10-CM | POA: Diagnosis not present

## 2019-10-31 DIAGNOSIS — D631 Anemia in chronic kidney disease: Secondary | ICD-10-CM | POA: Diagnosis not present

## 2019-10-31 DIAGNOSIS — Z992 Dependence on renal dialysis: Secondary | ICD-10-CM | POA: Diagnosis not present

## 2019-10-31 DIAGNOSIS — N186 End stage renal disease: Secondary | ICD-10-CM | POA: Diagnosis not present

## 2019-10-31 DIAGNOSIS — N2581 Secondary hyperparathyroidism of renal origin: Secondary | ICD-10-CM | POA: Diagnosis not present

## 2019-11-03 DIAGNOSIS — Z992 Dependence on renal dialysis: Secondary | ICD-10-CM | POA: Diagnosis not present

## 2019-11-03 DIAGNOSIS — I129 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 kidney disease, or unspecified chronic kidney disease: Secondary | ICD-10-CM | POA: Diagnosis not present

## 2019-11-03 DIAGNOSIS — E118 Type 2 diabetes mellitus with unspecified complications: Secondary | ICD-10-CM | POA: Diagnosis not present

## 2019-11-03 DIAGNOSIS — N186 End stage renal disease: Secondary | ICD-10-CM | POA: Diagnosis not present

## 2019-11-03 DIAGNOSIS — D631 Anemia in chronic kidney disease: Secondary | ICD-10-CM | POA: Diagnosis not present

## 2019-11-03 DIAGNOSIS — N2581 Secondary hyperparathyroidism of renal origin: Secondary | ICD-10-CM | POA: Diagnosis not present

## 2019-11-05 DIAGNOSIS — N2581 Secondary hyperparathyroidism of renal origin: Secondary | ICD-10-CM | POA: Diagnosis not present

## 2019-11-05 DIAGNOSIS — D631 Anemia in chronic kidney disease: Secondary | ICD-10-CM | POA: Diagnosis not present

## 2019-11-05 DIAGNOSIS — Z992 Dependence on renal dialysis: Secondary | ICD-10-CM | POA: Diagnosis not present

## 2019-11-05 DIAGNOSIS — N186 End stage renal disease: Secondary | ICD-10-CM | POA: Diagnosis not present

## 2019-11-07 DIAGNOSIS — D631 Anemia in chronic kidney disease: Secondary | ICD-10-CM | POA: Diagnosis not present

## 2019-11-07 DIAGNOSIS — N2581 Secondary hyperparathyroidism of renal origin: Secondary | ICD-10-CM | POA: Diagnosis not present

## 2019-11-07 DIAGNOSIS — Z992 Dependence on renal dialysis: Secondary | ICD-10-CM | POA: Diagnosis not present

## 2019-11-07 DIAGNOSIS — N186 End stage renal disease: Secondary | ICD-10-CM | POA: Diagnosis not present

## 2019-11-10 DIAGNOSIS — D631 Anemia in chronic kidney disease: Secondary | ICD-10-CM | POA: Diagnosis not present

## 2019-11-10 DIAGNOSIS — N2581 Secondary hyperparathyroidism of renal origin: Secondary | ICD-10-CM | POA: Diagnosis not present

## 2019-11-10 DIAGNOSIS — Z992 Dependence on renal dialysis: Secondary | ICD-10-CM | POA: Diagnosis not present

## 2019-11-10 DIAGNOSIS — N186 End stage renal disease: Secondary | ICD-10-CM | POA: Diagnosis not present

## 2019-11-12 DIAGNOSIS — N186 End stage renal disease: Secondary | ICD-10-CM | POA: Diagnosis not present

## 2019-11-12 DIAGNOSIS — N2581 Secondary hyperparathyroidism of renal origin: Secondary | ICD-10-CM | POA: Diagnosis not present

## 2019-11-12 DIAGNOSIS — D631 Anemia in chronic kidney disease: Secondary | ICD-10-CM | POA: Diagnosis not present

## 2019-11-12 DIAGNOSIS — Z992 Dependence on renal dialysis: Secondary | ICD-10-CM | POA: Diagnosis not present

## 2019-11-14 DIAGNOSIS — N186 End stage renal disease: Secondary | ICD-10-CM | POA: Diagnosis not present

## 2019-11-14 DIAGNOSIS — N2581 Secondary hyperparathyroidism of renal origin: Secondary | ICD-10-CM | POA: Diagnosis not present

## 2019-11-14 DIAGNOSIS — D631 Anemia in chronic kidney disease: Secondary | ICD-10-CM | POA: Diagnosis not present

## 2019-11-14 DIAGNOSIS — Z992 Dependence on renal dialysis: Secondary | ICD-10-CM | POA: Diagnosis not present

## 2019-11-16 DIAGNOSIS — T782XXA Anaphylactic shock, unspecified, initial encounter: Secondary | ICD-10-CM | POA: Insufficient documentation

## 2019-11-16 DIAGNOSIS — T7840XA Allergy, unspecified, initial encounter: Secondary | ICD-10-CM | POA: Insufficient documentation

## 2019-11-17 DIAGNOSIS — N186 End stage renal disease: Secondary | ICD-10-CM | POA: Diagnosis not present

## 2019-11-17 DIAGNOSIS — D631 Anemia in chronic kidney disease: Secondary | ICD-10-CM | POA: Diagnosis not present

## 2019-11-17 DIAGNOSIS — N2581 Secondary hyperparathyroidism of renal origin: Secondary | ICD-10-CM | POA: Diagnosis not present

## 2019-11-17 DIAGNOSIS — Z992 Dependence on renal dialysis: Secondary | ICD-10-CM | POA: Diagnosis not present

## 2019-11-18 DIAGNOSIS — Z992 Dependence on renal dialysis: Secondary | ICD-10-CM | POA: Diagnosis not present

## 2019-11-18 DIAGNOSIS — I871 Compression of vein: Secondary | ICD-10-CM | POA: Diagnosis not present

## 2019-11-18 DIAGNOSIS — T82858A Stenosis of vascular prosthetic devices, implants and grafts, initial encounter: Secondary | ICD-10-CM | POA: Diagnosis not present

## 2019-11-18 DIAGNOSIS — N186 End stage renal disease: Secondary | ICD-10-CM | POA: Diagnosis not present

## 2019-11-19 DIAGNOSIS — Z992 Dependence on renal dialysis: Secondary | ICD-10-CM | POA: Diagnosis not present

## 2019-11-19 DIAGNOSIS — D631 Anemia in chronic kidney disease: Secondary | ICD-10-CM | POA: Diagnosis not present

## 2019-11-19 DIAGNOSIS — N186 End stage renal disease: Secondary | ICD-10-CM | POA: Diagnosis not present

## 2019-11-19 DIAGNOSIS — N2581 Secondary hyperparathyroidism of renal origin: Secondary | ICD-10-CM | POA: Diagnosis not present

## 2019-11-20 ENCOUNTER — Encounter (HOSPITAL_COMMUNITY): Payer: Self-pay

## 2019-11-20 ENCOUNTER — Ambulatory Visit (INDEPENDENT_AMBULATORY_CARE_PROVIDER_SITE_OTHER): Payer: Medicare Other

## 2019-11-20 ENCOUNTER — Other Ambulatory Visit: Payer: Self-pay

## 2019-11-20 ENCOUNTER — Ambulatory Visit (HOSPITAL_COMMUNITY)
Admission: EM | Admit: 2019-11-20 | Discharge: 2019-11-20 | Disposition: A | Discharge status: Home / Self Care | Payer: Medicare Other | Attending: Emergency Medicine | Admitting: Emergency Medicine

## 2019-11-20 DIAGNOSIS — S46911A Strain of unspecified muscle, fascia and tendon at shoulder and upper arm level, right arm, initial encounter: Secondary | ICD-10-CM

## 2019-11-20 DIAGNOSIS — M25511 Pain in right shoulder: Secondary | ICD-10-CM

## 2019-11-20 DIAGNOSIS — W19XXXA Unspecified fall, initial encounter: Secondary | ICD-10-CM

## 2019-11-20 DIAGNOSIS — S0990XA Unspecified injury of head, initial encounter: Secondary | ICD-10-CM

## 2019-11-20 MED ORDER — DICLOFENAC SODIUM 1 % EX GEL: 4.0000 g | Freq: Four times a day (QID) | CUTANEOUS | 0 refills | Status: DC | PRN

## 2019-11-20 MED ORDER — ACETAMINOPHEN 500 MG PO TABS: 1000.0000 mg | ORAL_TABLET | Freq: Four times a day (QID) | ORAL | 0 refills | Status: DC | PRN

## 2019-11-20 NOTE — ED Triage Notes (Signed)
Pt states he stumbled and fell and hit anterior of head and injured right shoulder today. Pt denies LOC. PT denies blood thinners. Pt c/o of 8/10 pain in right shoulder with movement. Pt denies N/V, vision changes. Pt is A&Ox4.

## 2019-11-20 NOTE — Discharge Instructions (Signed)
Your xray is normal tonight.  It is still possible you have injured non-bony structures in the shoulder during your fall.  Elevation, ice, tylenol as needed for pain. Topical voltaren gel may help as well.  Cleanse your forehead every day with soap and water, avoid touching it to keep it clean.  Follow up with your primary care provider and/or orthopedics for persistent shoulder pain as you may need further evaluation.  Any worsening of headache, dizziness, confusion, weakness, or otherwise worsening please return or go to the ER.

## 2019-11-20 NOTE — ED Provider Notes (Signed)
[facility]    CSN: [id number] Arrival date & time: 11/20/19  1801      History   Chief Complaint Chief Complaint  Patient presents with  . Fall    HPI Elijah White is a 79 y.o. male.   Elijah White presents with family with complaints of right shoulder pain and facial contusion s/p fall today. He was sitting in a chair sleeping, got up and stumbled, struck head and right shoulder on tv stand. Occurred around 2 hours ago. Fell to the ground. He had to crawl on the floor to use the sofa to get himself off of the ground. No LOC. Immediate forehead pain. This has resolved. Now with right shoulder pain. No altered mental status. No nausea or vomiting. No neck pain. Not on a blood thinner. He is on dialysis. Small forehead laceration/ abrasion. This has been cleansed before arrival and no further bleeding. Swelling around this has improved since being here. States has had tdap in the last 5 years.    ROS per HPI, negative if not otherwise mentioned.      Past Medical History:  Diagnosis Date  . Anemia    low iron  . Arthritis   . Cancer (HCC) ?2006   prostate  . Chronic kidney disease    Stage 4  . Diabetes mellitus without complication (HCC)   . Elevated cholesterol with high triglycerides   . GERD (gastroesophageal reflux disease)   . Hypertension   . Legally blind in left eye, as defined in [place]    has pinpoint vision in right eye    Patient Active Problem List   Diagnosis Date Noted  . ARF (acute renal failure) (HCC) 03/25/2018  . Acute on chronic diastolic CHF (congestive heart failure) (HCC) 03/25/2018  . 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 Vision loss of right eye 11/11/2017  . Acute decompensated heart failure (HCC) 09/01/2017  . Bilateral pseudophakia 07/11/2017  . 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 GERD (gastroesophageal reflux disease) 09/10/2016  . HLD (hyperlipidemia) 09/10/2016  . Dizziness 08/13/2016  . Renal  failure (ARF), acute on chronic (HCC) 08/13/2016  . Essential hypertension 08/13/2016  . Cataract, nuclear 10/29/2012  . 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  . Type 2 diabetes mellitus with renal manifestations (HCC) 10/22/2006  . BENIGN PROSTATIC HYPERTROPHY 10/22/2006  . COLONIC POLYPS, HX OF 10/22/2006    Past Surgical History:  Procedure Laterality Date  . AV FISTULA PLACEMENT Left 03/20/2018   Procedure: ARTERIOVENOUS (AV) FISTULA CREATION ARM;  Surgeon: Cain, Brandon Christopher, MD;  Location: [facility];  Service: Vascular;  Laterality: Left;  . BASCILIC VEIN TRANSPOSITION Left 05/21/2018   Procedure: LEFT BASILIC VEIN FISTULA SECOND STAGE;  Surgeon: Cain, Brandon Christopher, MD;  Location: [facility] OR;  Service: Vascular;  Laterality: Left;  . COLONOSCOPY    . GLAUCOMA SURGERY  2019   multiple surgeries  . KNEE ARTHROSCOPY    . PROSTATE SURGERY         Home Medications    Prior to Admission medications   Medication Sig Start Date End Date Taking? Authorizing Provider  acetaminophen (TYLENOL) 500 MG tablet Take 2 tablets (1,000 mg total) by mouth every 6 (six) hours as needed for mild pain, moderate pain or headache. 11/20/19   ,  B, NP  apraclonidine (IOPIDINE) 0.5 % ophthalmic solution SMARTSIG:1 Drop(s) Right Eye Every 12 Hours 08/04/19   [provider]  atorvastatin (LIPITOR) 10 MG tablet TAKE   1 TABLET BY MOUTH EVERY DAY 08/17/19   Sanders, Robyn, MD  AURYXIA 1 GM 210 MG(Fe) tablet Take 210 mg by mouth 3 (three) times daily. 07/20/19   [provider]  B Complex-C-Folic Acid (DIALYVITE 800) 0.8 MG TABS Take 1 tablet by mouth daily. 05/08/19   [provider]  BD PEN NEEDLE NANO U/F 32G X 4 MM MISC USE SUBCUTANEOUS FOUR TIMES A DAY 03/31/18   [provider]  bimatoprost (LUMIGAN) 0.01 % SOLN Place 1 drop into the right eye at bedtime. 06/25/17   [provider]  blood glucose meter kit and supplies KIT Dispense based on patient  and insurance preference. Use up to four times daily as directed. (FOR ICD-9 250.00, 250.01). For QAC - HS accuchecks. 03/30/18   Singh, Prashant K, MD  brimonidine (ALPHAGAN) 0.15 % ophthalmic solution Place 1 drop into the right eye 3 (three) times daily.  08/02/17   [provider]  carvedilol (COREG) 3.125 MG tablet TAKE 1 TABLET(3.125 MG) BY MOUTH TWICE DAILY WITH A MEAL 11/02/19   Sanders, Robyn, MD  diclofenac Sodium (VOLTAREN) 1 % GEL Apply 4 g topically 4 (four) times daily as needed. 11/20/19   ,  B, NP  dorzolamide (TRUSOPT) 2 % ophthalmic solution 1 drop daily. 04/21/19   [provider]  dorzolamide-timolol (COSOPT) 22.3-6.8 MG/ML ophthalmic solution Place 1 drop into the right eye 2 (two) times daily. 06/25/17   [provider]  esomeprazole (NEXIUM) 40 MG capsule TAKE 1 CAPSULE BY MOUTH EVERY DAY 06/14/19   Sanders, Robyn, MD  gabapentin (NEURONTIN) 100 MG capsule TAKE 1 CAPSULE(100 MG) BY MOUTH AT BEDTIME 10/12/19   Moore, Janece, FNP  hydrALAZINE (APRESOLINE) 25 MG tablet TAKE 1 TABLET(25 MG) BY MOUTH TWICE DAILY 06/25/19   Sanders, Robyn, MD  isosorbide mononitrate (IMDUR) 30 MG 24 hr tablet TAKE 1 TABLET(30 MG) BY MOUTH DAILY 11/02/19   Sanders, Robyn, MD  Lancets (ONETOUCH DELICA PLUS LANCET33G) MISC USE AS DIRECTED UP TO FOUR TIMES A DAY 03/31/18   [provider]  ONETOUCH VERIO test strip USE AS DIRECTED TO TEST FOUR TIMES A DAY 07/31/18   Sanders, Robyn, MD  pilocarpine (PILOCAR) 1 % ophthalmic solution 1 drop daily. 04/21/19   [provider]  pilocarpine (PILOCAR) 4 % ophthalmic solution Place 1 drop into the right eye 3 (three) times daily.    [provider]    Family History Family History  Problem Relation Age of Onset  . CAD Mother     Social History Social History   Tobacco Use  . Smoking status: Former Smoker    Packs/day: 0.50    Quit date: 03/23/2018    Years since quitting: 1.6  . Smokeless tobacco:  Never Used  Vaping Use  . Vaping Use: Never used  Substance Use Topics  . Alcohol use: No  . Drug use: No     Allergies   Patient has no known allergies.   Review of Systems Review of Systems   Physical Exam Triage Vital Signs ED Triage Vitals  Enc Vitals Group     BP 11/20/19 1934 (!) 160/79     Pulse Rate 11/20/19 1934 77     Resp 11/20/19 1934 18     Temp 11/20/19 1934 98.1 F (36.7 C)     Temp Source 11/20/19 1934 Oral     SpO2 11/20/19 1934 100 %     Weight 11/20/19 1935 172 lb (78 kg)       Height 11/20/19 1935 5' (1.524 m)     Head Circumference --      Peak Flow --      Pain Score 11/20/19 1935 8     Pain Loc --      Pain Edu? --      Excl. in [place]? --    No data found.  Updated Vital Signs BP (!) 160/79   Pulse 77   Temp 98.1 F (36.7 C) (Oral)   Resp 18   Ht 5' (1.524 m)   Wt 172 lb (78 kg)   SpO2 100%   BMI 33.59 kg/m   Visual Acuity Right Eye Distance:   Left Eye Distance:   Bilateral Distance:    Right Eye Near:   Left Eye Near:    Bilateral Near:     Physical Exam Constitutional:      General: He is not in acute distress.    Appearance: Normal appearance. He is well-developed.  HENT:     Head: Abrasion and contusion present. No right periorbital erythema.     Jaw: There is normal jaw occlusion.      Comments: Contusion to right forehead noted which is non tender, mild swelling (no significant hematoma), with a 0.5cm superficial linear abrasion, without active bleeding- cleansed and bandage placed  Eyes:     General: Lids are normal.     Pupils: Pupils are equal, round, and reactive to light.  Cardiovascular:     Rate and Rhythm: Normal rate.  Pulmonary:     Effort: Pulmonary effort is normal.  Musculoskeletal:     Right shoulder: Tenderness and bony tenderness present. No swelling. Normal range of motion. Normal strength. Normal pulse.     Cervical back: Full passive range of motion without pain and normal range of motion. No  pain with movement, spinous process tenderness or muscular tenderness. Normal range of motion.     Comments: Full ROM of right shoulder but with pain with above the shoulder height; no pain to proximal humerus; mild tenderness to ac joint on palpation; gross sensation intact; using arm with his walker while ambulating   Skin:    General: Skin is warm and dry.  Neurological:     General: No focal deficit present.     Mental Status: He is alert and oriented to person, place, and time. Mental status is at baseline.      UC Treatments / Results  Labs (all labs ordered are listed, but only abnormal results are displayed) Labs Reviewed - No data to display  EKG   Radiology DG Shoulder Right  Result Date: 11/20/2019 CLINICAL DATA:  Fall, right shoulder pain EXAM: RIGHT SHOULDER - 2+ VIEW COMPARISON:  None. FINDINGS: Three view radiograph right shoulder demonstrates normal alignment. No fracture or dislocation. Mild acromioclavicular and glenohumeral degenerative arthritis with asymmetric joint space narrowing and subtle osteophyte formation noted. Limited evaluation of the right apex is unremarkable. IMPRESSION: No acute fracture or dislocation. Electronically Signed   By: Fidela Salisbury MD   On: 11/20/2019 20:53    Procedures Procedures (including critical care time)  Medications Ordered in UC Medications - No data to display  Initial Impression / Assessment and Plan / UC Course  I have reviewed the triage vital signs and the nursing notes.  Pertinent labs & imaging results that were available during my care of the patient were reviewed by me and considered in my medical decision making (see chart for details).     No  neuro symptoms or findings on exam. No red flag findings. Abrasion cleansed and dressed. Right shoulder without findings on xray. Pain management and expected course of rehab, including follow up as needed, discussed. Er precautions provided. Patient and family  verbalized understanding and agreeable to plan.  Ambulatory out of clinic without difficulty.    Final Clinical Impressions(s) / UC Diagnoses   Final diagnoses:  Fall, initial encounter  Minor head injury, initial encounter  Strain of right shoulder, initial encounter     Discharge Instructions     Your xray is normal tonight.  It is still possible you have injured non-bony structures in the shoulder during your fall.  Elevation, ice, tylenol as needed for pain. Topical voltaren gel may help as well.  Cleanse your forehead every day with soap and water, avoid touching it to keep it clean.  Follow up with your primary care provider and/or orthopedics for persistent shoulder pain as you may need further evaluation.  Any worsening of headache, dizziness, confusion, weakness, or otherwise worsening please return or go to the ER.     ED Prescriptions    Medication Sig Dispense Auth. Provider   acetaminophen (TYLENOL) 500 MG tablet Take 2 tablets (1,000 mg total) by mouth every 6 (six) hours as needed for mild pain, moderate pain or headache. 60 tablet Augusto Gamble B, NP   diclofenac Sodium (VOLTAREN) 1 % GEL Apply 4 g topically 4 (four) times daily as needed. 100 g Zigmund Gottron, NP     PDMP not reviewed this encounter.   Zigmund Gottron, NP 11/21/19 [phone]

## 2019-11-21 DIAGNOSIS — D631 Anemia in chronic kidney disease: Secondary | ICD-10-CM | POA: Diagnosis not present

## 2019-11-21 DIAGNOSIS — N186 End stage renal disease: Secondary | ICD-10-CM | POA: Diagnosis not present

## 2019-11-21 DIAGNOSIS — N2581 Secondary hyperparathyroidism of renal origin: Secondary | ICD-10-CM | POA: Diagnosis not present

## 2019-11-21 DIAGNOSIS — Z992 Dependence on renal dialysis: Secondary | ICD-10-CM | POA: Diagnosis not present

## 2019-11-23 ENCOUNTER — Other Ambulatory Visit: Payer: Self-pay | Admitting: Internal Medicine

## 2019-11-24 DIAGNOSIS — D631 Anemia in chronic kidney disease: Secondary | ICD-10-CM | POA: Diagnosis not present

## 2019-11-24 DIAGNOSIS — N2581 Secondary hyperparathyroidism of renal origin: Secondary | ICD-10-CM | POA: Diagnosis not present

## 2019-11-24 DIAGNOSIS — Z992 Dependence on renal dialysis: Secondary | ICD-10-CM | POA: Diagnosis not present

## 2019-11-24 DIAGNOSIS — N186 End stage renal disease: Secondary | ICD-10-CM | POA: Diagnosis not present

## 2019-11-25 ENCOUNTER — Encounter: Payer: Self-pay | Admitting: Podiatry

## 2019-11-25 ENCOUNTER — Ambulatory Visit: Payer: Medicare Other | Admitting: Podiatry

## 2019-11-25 ENCOUNTER — Other Ambulatory Visit: Payer: Self-pay

## 2019-11-25 DIAGNOSIS — M79674 Pain in right toe(s): Secondary | ICD-10-CM

## 2019-11-25 DIAGNOSIS — B351 Tinea unguium: Secondary | ICD-10-CM | POA: Diagnosis not present

## 2019-11-25 DIAGNOSIS — M2042 Other hammer toe(s) (acquired), left foot: Secondary | ICD-10-CM

## 2019-11-25 DIAGNOSIS — M2041 Other hammer toe(s) (acquired), right foot: Secondary | ICD-10-CM

## 2019-11-25 DIAGNOSIS — E1142 Type 2 diabetes mellitus with diabetic polyneuropathy: Secondary | ICD-10-CM

## 2019-11-25 DIAGNOSIS — M79675 Pain in left toe(s): Secondary | ICD-10-CM | POA: Diagnosis not present

## 2019-11-25 NOTE — Progress Notes (Signed)
Subjective:  Patient ID: Elijah White, male    DOB: Mar 20, 1940,  MRN: [id number]  79 y.o. 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 Pain interferes with ambulation. Aggravating factors include wearing enclosed shoe gear. Pain is relieved with periodic professional debridement.   His daughter is present during today's visit.  Elijah White started hemodialysis in February and is on a TTS schedule. He states he feels a little tired today from yesterday. Daughter states sometimes this occurs after his treatment.  He also states he has burning on the bottom of his feet. He takes gabapentin for neuropathy.  Review of Systems: Negative except as noted in the HPI.  Past Medical History:  Diagnosis Date  . Anemia    low iron  . Arthritis   . Cancer [facility]) ?2006   prostate  . Chronic kidney disease    Stage 4  . Diabetes mellitus without complication ([place])   . Elevated cholesterol with high triglycerides   . GERD (gastroesophageal reflux disease)   . Hypertension   . Legally blind in left eye, as defined in [place]    has pinpoint vision in right eye   Past Surgical History:  Procedure Laterality Date  . AV FISTULA PLACEMENT Left 03/20/2018   Procedure: ARTERIOVENOUS (AV) FISTULA CREATION ARM;  Surgeon: [name], MD;  Location: [place];  Service: Vascular;  Laterality: Left;  . BASCILIC VEIN TRANSPOSITION Left 05/21/2018   Procedure: LEFT BASILIC VEIN FISTULA SECOND STAGE;  Surgeon: [name], MD;  Location: [place];  Service: Vascular;  Laterality: Left;  . COLONOSCOPY    . GLAUCOMA SURGERY  2019   multiple surgeries  . KNEE ARTHROSCOPY    . PROSTATE SURGERY     Patient Active Problem List   Diagnosis Date Noted  . ARF (acute renal failure) ([place]) 03/25/2018  . Acute on chronic diastolic CHF (congestive heart failure) ([place]) 03/25/2018  . 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 Vision loss of right eye 11/11/2017  . Acute decompensated heart failure ([place]) 09/01/2017  . Bilateral pseudophakia 07/11/2017  . 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 GERD (gastroesophageal reflux disease) 09/10/2016  . HLD (hyperlipidemia) 09/10/2016  . Dizziness 08/13/2016  . Renal failure (ARF), acute on chronic (HCC) 08/13/2016  . Essential hypertension 08/13/2016  . Cataract, nuclear 10/29/2012  . 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  . Type 2 diabetes mellitus with renal manifestations ([place]) 10/22/2006  . BENIGN PROSTATIC HYPERTROPHY 10/22/2006  . COLONIC POLYPS, HX OF 10/22/2006    Current Outpatient Medications:  .  acetaminophen (TYLENOL) 500 MG tablet, Take 2 tablets (1,000 mg total) by mouth every 6 (six) hours as needed for mild pain, moderate pain or headache., Disp: 60 tablet, Rfl: 0 .  apraclonidine (IOPIDINE) 0.5 % ophthalmic solution, SMARTSIG:1 Drop(s) Right Eye Every 12 Hours, Disp: , Rfl:  .  atorvastatin (LIPITOR) 10 MG tablet, TAKE 1 TABLET BY MOUTH EVERY DAY, Disp: 90 tablet, Rfl: 0 .  AURYXIA 1 GM 210 MG(Fe) tablet, Take 210 mg by mouth 3 (three) times daily., Disp: , Rfl:  .  B Complex-C-Folic Acid (DIALYVITE 026) 0.8 MG TABS, Take 1 tablet by mouth daily., Disp: , Rfl:  .  BD PEN NEEDLE NANO U/F 32G X 4 MM MISC, USE SUBCUTANEOUS FOUR TIMES A DAY, Disp: , Rfl:  .  bimatoprost (LUMIGAN) 0.01 % SOLN, Place 1 drop  into the right eye at bedtime., Disp: , Rfl:  .  blood glucose meter kit and supplies KIT, Dispense based on patient and insurance preference. Use up to four times daily as directed. (FOR ICD-9 250.00, 250.01). For QAC - HS accuchecks., Disp: 1 each, Rfl: 1 .  brimonidine (ALPHAGAN) 0.15 % ophthalmic solution, Place 1 drop into the right eye 3 (three) times daily. , Disp: , Rfl: 11 .  carvedilol (COREG) 3.125 MG tablet, TAKE 1 TABLET(3.125 MG) BY MOUTH TWICE DAILY WITH A MEAL, Disp: 180 tablet, Rfl: 1 .  diclofenac Sodium  (VOLTAREN) 1 % GEL, Apply 4 g topically 4 (four) times daily as needed., Disp: 100 g, Rfl: 0 .  dorzolamide (TRUSOPT) 2 % ophthalmic solution, 1 drop daily., Disp: , Rfl:  .  dorzolamide-timolol (COSOPT) 22.3-6.8 MG/ML ophthalmic solution, Place 1 drop into the right eye 2 (two) times daily., Disp: , Rfl: 11 .  esomeprazole (NEXIUM) 40 MG capsule, TAKE 1 CAPSULE BY MOUTH EVERY DAY, Disp: 90 capsule, Rfl: 1 .  gabapentin (NEURONTIN) 100 MG capsule, TAKE 1 CAPSULE(100 MG) BY MOUTH AT BEDTIME, Disp: 90 capsule, Rfl: 1 .  hydrALAZINE (APRESOLINE) 25 MG tablet, TAKE 1 TABLET(25 MG) BY MOUTH TWICE DAILY, Disp: 180 tablet, Rfl: 1 .  isosorbide mononitrate (IMDUR) 30 MG 24 hr tablet, TAKE 1 TABLET(30 MG) BY MOUTH DAILY, Disp: 90 tablet, Rfl: 1 .  Lancets (ONETOUCH DELICA PLUS [id number]) MISC, USE AS DIRECTED UP TO FOUR TIMES A DAY, Disp: , Rfl:  .  ONETOUCH VERIO test strip, USE AS DIRECTED TO TEST FOUR TIMES A DAY, Disp: 100 each, Rfl: 3 .  pilocarpine (PILOCAR) 1 % ophthalmic solution, 1 drop daily., Disp: , Rfl:  .  pilocarpine (PILOCAR) 4 % ophthalmic solution, Place 1 drop into the right eye 3 (three) times daily., Disp: , Rfl:  No Known Allergies Social History   Tobacco Use  Smoking Status Former Smoker  . Packs/day: 0.50  . Quit date: 03/23/2018  . Years since quitting: 1.6  Smokeless Tobacco Never Used   Objective:  There were no vitals filed for this visit. Constitutional Patient is a pleasant 79 y.o. African American male WD, WN in NAD.[name] AAO x 3.  Vascular Capillary fill time to digits <3 seconds b/l lower extremities. Faintly palpable DP pulse(s) b/l lower extremities. Faintly palpable PT pulse(s) b/l lower extremities. Pedal hair sparse. Lower extremity skin temperature gradient within normal limits. No ischemia or gangrene noted b/l lower extremities. No cyanosis or clubbing noted.  Neurologic Normal speech. Oriented to person, place, and time. Pt has subjective symptoms of  neuropathy. Protective sensation intact 5/5 intact bilaterally with 10g monofilament b/l. Vibratory sensation intact b/l.  Dermatologic Pedal skin with normal turgor, texture and tone bilaterally. No open wounds bilaterally. No interdigital macerations bilaterally. Toenails 1-5 b/l elongated, discolored, dystrophic, thickened, crumbly with subungual debris and tenderness to dorsal palpation. No hyperkeratotic nor porokeratotic lesions present on today's visit.  Orthopedic: Normal muscle strength 5/5 to all lower extremity muscle groups bilaterally. Hammertoes noted to the L 5th toe and R 5th toe. Utilizes walker for ambulation assistance.   Hemoglobin A1C 09/11/2019  HGBA1C 6.7  Some recent data might be hidden   Assessment:   1. Pain due to onychomycosis of toenails of both feet   2. Acquired hammertoes of both feet   3. Diabetic peripheral neuropathy associated with type 2 diabetes mellitus ([place])    Plan:  Patient was evaluated and treated and all questions answered.  Onychomycosis with pain -Nails palliatively debridement as below. -Educated on self-care  Procedure: Nail Debridement Rationale: Pain Type of Debridement: manual, sharp debridement. Instrumentation: Nail nipper, rotary burr. Number of Nails: 10  -Examined patient. -Continue diabetic foot care principles. -Advised Mr. Tibbetts and his daughter to discuss gabapentin dosage. There are reasons he may not be able to take an increased dosage and I have asked them to discuss this with the prescriber. They related understanding. -We also discussed obtaining baseline ABI's -Toenails 1-5 b/l were debrided in length and girth with sterile nail nippers and dremel without iatrogenic bleeding.  -Patient to report any pedal injuries to medical professional immediately. -For foot pain, daughter instructed to apply Aspercreme to feet prn. -Patient to continue soft, supportive shoe gear daily. -Patient/POA to call should there be  question/concern in the interim.  Return in about 3 months (around 02/24/2020) for diabetic foot care, painful mycotic toenails.  [name], DPM

## 2019-11-26 ENCOUNTER — Emergency Department (HOSPITAL_COMMUNITY): Payer: Medicare Other

## 2019-11-26 ENCOUNTER — Encounter (HOSPITAL_COMMUNITY): Payer: Self-pay | Admitting: Internal Medicine

## 2019-11-26 ENCOUNTER — Inpatient Hospital Stay (HOSPITAL_COMMUNITY)
Admission: EM | Admit: 2019-11-26 | Discharge: 2019-12-03 | DRG: 286 | Disposition: A | Discharge status: Home / Self Care | Payer: Medicare Other | Source: Ambulatory Visit | Attending: Internal Medicine | Admitting: Internal Medicine

## 2019-11-26 ENCOUNTER — Inpatient Hospital Stay (HOSPITAL_COMMUNITY): Payer: Medicare Other

## 2019-11-26 ENCOUNTER — Other Ambulatory Visit: Payer: Self-pay

## 2019-11-26 DIAGNOSIS — R079 Chest pain, unspecified: Secondary | ICD-10-CM | POA: Diagnosis present

## 2019-11-26 DIAGNOSIS — Z01818 Encounter for other preprocedural examination: Secondary | ICD-10-CM | POA: Diagnosis not present

## 2019-11-26 DIAGNOSIS — D539 Nutritional anemia, unspecified: Secondary | ICD-10-CM | POA: Diagnosis not present

## 2019-11-26 DIAGNOSIS — K089 Disorder of teeth and supporting structures, unspecified: Secondary | ICD-10-CM | POA: Diagnosis not present

## 2019-11-26 DIAGNOSIS — D123 Benign neoplasm of transverse colon: Secondary | ICD-10-CM | POA: Diagnosis not present

## 2019-11-26 DIAGNOSIS — I132 Hypertensive heart and chronic kidney disease with heart failure and with stage 5 chronic kidney disease, or end stage renal disease: Secondary | ICD-10-CM | POA: Diagnosis present

## 2019-11-26 DIAGNOSIS — Z20822 Contact with and (suspected) exposure to covid-19: Secondary | ICD-10-CM | POA: Diagnosis present

## 2019-11-26 DIAGNOSIS — E785 Hyperlipidemia, unspecified: Secondary | ICD-10-CM | POA: Diagnosis present

## 2019-11-26 DIAGNOSIS — E1122 Type 2 diabetes mellitus with diabetic chronic kidney disease: Secondary | ICD-10-CM | POA: Diagnosis present

## 2019-11-26 DIAGNOSIS — E278 Other specified disorders of adrenal gland: Secondary | ICD-10-CM | POA: Diagnosis not present

## 2019-11-26 DIAGNOSIS — I10 Essential (primary) hypertension: Secondary | ICD-10-CM | POA: Diagnosis present

## 2019-11-26 DIAGNOSIS — Z992 Dependence on renal dialysis: Secondary | ICD-10-CM | POA: Diagnosis not present

## 2019-11-26 DIAGNOSIS — E1129 Type 2 diabetes mellitus with other diabetic kidney complication: Secondary | ICD-10-CM | POA: Diagnosis present

## 2019-11-26 DIAGNOSIS — R072 Precordial pain: Secondary | ICD-10-CM

## 2019-11-26 DIAGNOSIS — R0789 Other chest pain: Secondary | ICD-10-CM

## 2019-11-26 DIAGNOSIS — N186 End stage renal disease: Secondary | ICD-10-CM | POA: Diagnosis present

## 2019-11-26 DIAGNOSIS — I129 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 kidney disease, or unspecified chronic kidney disease: Secondary | ICD-10-CM | POA: Diagnosis not present

## 2019-11-26 DIAGNOSIS — H548 Legal blindness, as defined in USA: Secondary | ICD-10-CM | POA: Diagnosis not present

## 2019-11-26 DIAGNOSIS — I208 Other forms of angina pectoris: Secondary | ICD-10-CM | POA: Diagnosis not present

## 2019-11-26 DIAGNOSIS — R531 Weakness: Secondary | ICD-10-CM | POA: Diagnosis not present

## 2019-11-26 DIAGNOSIS — N1330 Unspecified hydronephrosis: Secondary | ICD-10-CM | POA: Diagnosis not present

## 2019-11-26 DIAGNOSIS — R933 Abnormal findings on diagnostic imaging of other parts of digestive tract: Secondary | ICD-10-CM | POA: Diagnosis not present

## 2019-11-26 DIAGNOSIS — N390 Urinary tract infection, site not specified: Secondary | ICD-10-CM | POA: Diagnosis not present

## 2019-11-26 DIAGNOSIS — R109 Unspecified abdominal pain: Secondary | ICD-10-CM | POA: Diagnosis not present

## 2019-11-26 DIAGNOSIS — K644 Residual hemorrhoidal skin tags: Secondary | ICD-10-CM | POA: Diagnosis present

## 2019-11-26 DIAGNOSIS — D122 Benign neoplasm of ascending colon: Secondary | ICD-10-CM | POA: Diagnosis not present

## 2019-11-26 DIAGNOSIS — Z01810 Encounter for preprocedural cardiovascular examination: Secondary | ICD-10-CM | POA: Diagnosis not present

## 2019-11-26 DIAGNOSIS — Z66 Do not resuscitate: Secondary | ICD-10-CM | POA: Diagnosis present

## 2019-11-26 DIAGNOSIS — E875 Hyperkalemia: Secondary | ICD-10-CM | POA: Diagnosis not present

## 2019-11-26 DIAGNOSIS — Z978 Presence of other specified devices: Secondary | ICD-10-CM

## 2019-11-26 DIAGNOSIS — M898X9 Other specified disorders of bone, unspecified site: Secondary | ICD-10-CM | POA: Diagnosis present

## 2019-11-26 DIAGNOSIS — E1140 Type 2 diabetes mellitus with diabetic neuropathy, unspecified: Secondary | ICD-10-CM | POA: Diagnosis not present

## 2019-11-26 DIAGNOSIS — K6389 Other specified diseases of intestine: Secondary | ICD-10-CM | POA: Diagnosis not present

## 2019-11-26 DIAGNOSIS — I5033 Acute on chronic diastolic (congestive) heart failure: Secondary | ICD-10-CM | POA: Diagnosis present

## 2019-11-26 DIAGNOSIS — K648 Other hemorrhoids: Secondary | ICD-10-CM | POA: Diagnosis not present

## 2019-11-26 DIAGNOSIS — R1013 Epigastric pain: Secondary | ICD-10-CM | POA: Diagnosis not present

## 2019-11-26 DIAGNOSIS — K621 Rectal polyp: Secondary | ICD-10-CM | POA: Diagnosis not present

## 2019-11-26 DIAGNOSIS — Z79899 Other long term (current) drug therapy: Secondary | ICD-10-CM

## 2019-11-26 DIAGNOSIS — R935 Abnormal findings on diagnostic imaging of other abdominal regions, including retroperitoneum: Secondary | ICD-10-CM | POA: Diagnosis not present

## 2019-11-26 DIAGNOSIS — Z87891 Personal history of nicotine dependence: Secondary | ICD-10-CM

## 2019-11-26 DIAGNOSIS — I2510 Atherosclerotic heart disease of native coronary artery without angina pectoris: Secondary | ICD-10-CM | POA: Diagnosis not present

## 2019-11-26 DIAGNOSIS — I4510 Unspecified right bundle-branch block: Secondary | ICD-10-CM | POA: Diagnosis not present

## 2019-11-26 DIAGNOSIS — K8020 Calculus of gallbladder without cholecystitis without obstruction: Secondary | ICD-10-CM | POA: Diagnosis not present

## 2019-11-26 DIAGNOSIS — Z8546 Personal history of malignant neoplasm of prostate: Secondary | ICD-10-CM

## 2019-11-26 DIAGNOSIS — I350 Nonrheumatic aortic (valve) stenosis: Secondary | ICD-10-CM | POA: Diagnosis not present

## 2019-11-26 DIAGNOSIS — Z9181 History of falling: Secondary | ICD-10-CM

## 2019-11-26 DIAGNOSIS — M1990 Unspecified osteoarthritis, unspecified site: Secondary | ICD-10-CM | POA: Diagnosis present

## 2019-11-26 DIAGNOSIS — H409 Unspecified glaucoma: Secondary | ICD-10-CM | POA: Diagnosis not present

## 2019-11-26 DIAGNOSIS — N2581 Secondary hyperparathyroidism of renal origin: Secondary | ICD-10-CM | POA: Diagnosis not present

## 2019-11-26 DIAGNOSIS — E279 Disorder of adrenal gland, unspecified: Secondary | ICD-10-CM | POA: Diagnosis not present

## 2019-11-26 DIAGNOSIS — D121 Benign neoplasm of appendix: Secondary | ICD-10-CM | POA: Diagnosis not present

## 2019-11-26 DIAGNOSIS — R0989 Other specified symptoms and signs involving the circulatory and respiratory systems: Secondary | ICD-10-CM | POA: Diagnosis not present

## 2019-11-26 DIAGNOSIS — E1121 Type 2 diabetes mellitus with diabetic nephropathy: Secondary | ICD-10-CM | POA: Diagnosis not present

## 2019-11-26 DIAGNOSIS — Z86010 Personal history of colonic polyps: Secondary | ICD-10-CM | POA: Diagnosis not present

## 2019-11-26 DIAGNOSIS — Z8249 Family history of ischemic heart disease and other diseases of the circulatory system: Secondary | ICD-10-CM

## 2019-11-26 DIAGNOSIS — I120 Hypertensive chronic kidney disease with stage 5 chronic kidney disease or end stage renal disease: Secondary | ICD-10-CM | POA: Diagnosis not present

## 2019-11-26 DIAGNOSIS — Z743 Need for continuous supervision: Secondary | ICD-10-CM | POA: Diagnosis not present

## 2019-11-26 DIAGNOSIS — D128 Benign neoplasm of rectum: Secondary | ICD-10-CM | POA: Diagnosis not present

## 2019-11-26 DIAGNOSIS — K219 Gastro-esophageal reflux disease without esophagitis: Secondary | ICD-10-CM | POA: Diagnosis present

## 2019-11-26 DIAGNOSIS — Z09 Encounter for follow-up examination after completed treatment for conditions other than malignant neoplasm: Secondary | ICD-10-CM

## 2019-11-26 DIAGNOSIS — I700 Atherosclerosis of aorta: Secondary | ICD-10-CM | POA: Diagnosis not present

## 2019-11-26 DIAGNOSIS — R6889 Other general symptoms and signs: Secondary | ICD-10-CM | POA: Diagnosis not present

## 2019-11-26 DIAGNOSIS — D124 Benign neoplasm of descending colon: Secondary | ICD-10-CM | POA: Diagnosis not present

## 2019-11-26 HISTORY — DX: Polyp of colon: K63.5

## 2019-11-26 LAB — FOLATE: Folate: 56.3 ng/mL (ref >5.9)

## 2019-11-26 LAB — RESPIRATORY PANEL BY RT PCR (FLU A&B, COVID)
Influenza A by PCR: NEGATIVE
Influenza B by PCR: NEGATIVE
SARS Coronavirus 2 by RT PCR: NEGATIVE

## 2019-11-26 LAB — COMPREHENSIVE METABOLIC PANEL
ALT: 15 U/L (ref 0–44)
AST: 19 U/L (ref 15–41)
Albumin: 3.7 g/dL (ref 3.5–5.0)
Alkaline Phosphatase: 99 U/L (ref 38–126)
Anion gap: 11 (ref 5–15)
BUN: 27 mg/dL — ABNORMAL HIGH (ref 8–23)
CO2: 29 mmol/L (ref 22–32)
Calcium: 9.8 mg/dL (ref 8.9–10.3)
Chloride: 100 mmol/L (ref 98–111)
Creatinine, Ser: 6.92 mg/dL — ABNORMAL HIGH (ref 0.61–1.24)
GFR calc Af Amer: 8 mL/min — ABNORMAL LOW (ref >60)
GFR calc non Af Amer: 7 mL/min — ABNORMAL LOW (ref >60)
Glucose, Bld: 199 mg/dL — ABNORMAL HIGH (ref 70–99)
Potassium: 4.1 mmol/L (ref 3.5–5.1)
Sodium: 140 mmol/L (ref 135–145)
Total Bilirubin: 0.5 mg/dL (ref 0.3–1.2)
Total Protein: 7 g/dL (ref 6.5–8.1)

## 2019-11-26 LAB — CBC WITH DIFFERENTIAL/PLATELET
Abs Immature Granulocytes: 0.02 10*3/uL (ref 0.00–0.07)
Basophils Absolute: 0 10*3/uL (ref 0.0–0.1)
Basophils Relative: 1 %
Eosinophils Absolute: 0.2 10*3/uL (ref 0.0–0.5)
Eosinophils Relative: 2 %
HCT: 36.8 % — ABNORMAL LOW (ref 39.0–52.0)
Hemoglobin: 11.7 g/dL — ABNORMAL LOW (ref 13.0–17.0)
Immature Granulocytes: 0 %
Lymphocytes Relative: 27 %
Lymphs Abs: 2.1 10*3/uL (ref 0.7–4.0)
MCH: 33.3 pg (ref 26.0–34.0)
MCHC: 31.8 g/dL (ref 30.0–36.0)
MCV: 104.8 fL — ABNORMAL HIGH (ref 80.0–100.0)
Monocytes Absolute: 1.3 10*3/uL — ABNORMAL HIGH (ref 0.1–1.0)
Monocytes Relative: 17 %
Neutro Abs: 4 10*3/uL (ref 1.7–7.7)
Neutrophils Relative %: 53 %
Platelets: 194 10*3/uL (ref 150–400)
RBC: 3.51 MIL/uL — ABNORMAL LOW (ref 4.22–5.81)
RDW: 12.9 % (ref 11.5–15.5)
WBC: 7.6 10*3/uL (ref 4.0–10.5)
nRBC: 0 % (ref 0.0–0.2)

## 2019-11-26 LAB — CBC
HCT: 36.9 % — ABNORMAL LOW (ref 39.0–52.0)
Hemoglobin: 12.1 g/dL — ABNORMAL LOW (ref 13.0–17.0)
MCH: 33.6 pg (ref 26.0–34.0)
MCHC: 32.8 g/dL (ref 30.0–36.0)
MCV: 102.5 fL — ABNORMAL HIGH (ref 80.0–100.0)
Platelets: 209 10*3/uL (ref 150–400)
RBC: 3.6 MIL/uL — ABNORMAL LOW (ref 4.22–5.81)
RDW: 12.7 % (ref 11.5–15.5)
WBC: 6.2 10*3/uL (ref 4.0–10.5)
nRBC: 0 % (ref 0.0–0.2)

## 2019-11-26 LAB — HEMOGLOBIN A1C
Hgb A1c MFr Bld: 7.5 % — ABNORMAL HIGH (ref 4.8–5.6)
Mean Plasma Glucose: 168.55 mg/dL

## 2019-11-26 LAB — GLUCOSE, CAPILLARY
Glucose-Capillary: 130 mg/dL — ABNORMAL HIGH (ref 70–99)
Glucose-Capillary: 217 mg/dL — ABNORMAL HIGH (ref 70–99)

## 2019-11-26 LAB — MAGNESIUM: Magnesium: 2.4 mg/dL (ref 1.7–2.4)

## 2019-11-26 LAB — MRSA PCR SCREENING: MRSA by PCR: NEGATIVE

## 2019-11-26 LAB — TROPONIN I (HIGH SENSITIVITY)
Troponin I (High Sensitivity): 36 ng/L — ABNORMAL HIGH (ref <18)
Troponin I (High Sensitivity): 37 ng/L — ABNORMAL HIGH (ref <18)
Troponin I (High Sensitivity): 41 ng/L — ABNORMAL HIGH (ref <18)

## 2019-11-26 LAB — CREATININE, SERUM
Creatinine, Ser: 7.33 mg/dL — ABNORMAL HIGH (ref 0.61–1.24)
GFR calc Af Amer: 7 mL/min — ABNORMAL LOW (ref >60)
GFR calc non Af Amer: 6 mL/min — ABNORMAL LOW (ref >60)

## 2019-11-26 LAB — LIPASE, BLOOD: Lipase: 29 U/L (ref 11–51)

## 2019-11-26 LAB — TSH: TSH: 0.82 u[IU]/mL (ref 0.350–4.500)

## 2019-11-26 LAB — VITAMIN B12: Vitamin B-12: 1012 pg/mL — ABNORMAL HIGH (ref 180–914)

## 2019-11-26 LAB — PROTIME-INR
INR: 1 (ref 0.8–1.2)
Prothrombin Time: 12.8 seconds (ref 11.4–15.2)

## 2019-11-26 MED ORDER — ONDANSETRON HCL 4 MG PO TABS: 4.0000 mg | ORAL_TABLET | Freq: Four times a day (QID) | ORAL | Status: DC | PRN

## 2019-11-26 MED ORDER — ACETAMINOPHEN 325 MG PO TABS
650.0000 mg | ORAL_TABLET | Freq: Four times a day (QID) | ORAL | Status: DC | PRN
  Administered 2019-11-26 – 2019-12-01 (×3): 650 mg via ORAL
  Filled 2019-11-26 (×3): qty 2

## 2019-11-26 MED ORDER — HYDRALAZINE HCL 20 MG/ML IJ SOLN
10.0000 mg | Freq: Four times a day (QID) | INTRAMUSCULAR | Status: DC | PRN
  Administered 2019-11-26: 10 mg via INTRAVENOUS
  Filled 2019-11-26: qty 1

## 2019-11-26 MED ORDER — IOHEXOL 350 MG/ML SOLN
100.0000 mL | Freq: Once | INTRAVENOUS | Status: AC | PRN
  Administered 2019-11-26: 100 mL via INTRAVENOUS

## 2019-11-26 MED ORDER — HYDROCORTISONE 1 % EX CREA
1.0000 "application " | TOPICAL_CREAM | CUTANEOUS | Status: DC | PRN
  Administered 2019-11-27 – 2019-11-28 (×3): 1 via TOPICAL
  Filled 2019-11-26: qty 28

## 2019-11-26 MED ORDER — ACETAMINOPHEN 650 MG RE SUPP: 650.0000 mg | Freq: Four times a day (QID) | RECTAL | Status: DC | PRN

## 2019-11-26 MED ORDER — HEPARIN SODIUM (PORCINE) 5000 UNIT/ML IJ SOLN
5000.0000 [IU] | Freq: Three times a day (TID) | INTRAMUSCULAR | Status: DC
  Administered 2019-11-27 – 2019-12-02 (×17): 5000 [IU] via SUBCUTANEOUS
  Filled 2019-11-26 (×19): qty 1

## 2019-11-26 MED ORDER — INSULIN ASPART 100 UNIT/ML ~~LOC~~ SOLN
0.0000 [IU] | Freq: Three times a day (TID) | SUBCUTANEOUS | Status: DC
  Administered 2019-11-27: 1 [IU] via SUBCUTANEOUS

## 2019-11-26 MED ORDER — PANTOPRAZOLE SODIUM 40 MG PO TBEC
40.0000 mg | DELAYED_RELEASE_TABLET | Freq: Every day | ORAL | Status: DC
  Administered 2019-11-26 – 2019-12-03 (×8): 40 mg via ORAL
  Filled 2019-11-26 (×8): qty 1

## 2019-11-26 MED ORDER — HYDROMORPHONE HCL 1 MG/ML IJ SOLN: 0.5000 mg | INTRAMUSCULAR | Status: DC | PRN

## 2019-11-26 MED ORDER — ONDANSETRON HCL 4 MG/2ML IJ SOLN: 4.0000 mg | Freq: Four times a day (QID) | INTRAMUSCULAR | Status: DC | PRN

## 2019-11-26 MED ORDER — GABAPENTIN 100 MG PO CAPS
100.0000 mg | ORAL_CAPSULE | Freq: Every day | ORAL | Status: DC
  Administered 2019-11-26 – 2019-11-29 (×4): 100 mg via ORAL
  Filled 2019-11-26 (×4): qty 1

## 2019-11-26 MED ORDER — INSULIN ASPART 100 UNIT/ML ~~LOC~~ SOLN
0.0000 [IU] | Freq: Every day | SUBCUTANEOUS | Status: DC
  Administered 2019-11-26 – 2019-11-28 (×2): 2 [IU] via SUBCUTANEOUS

## 2019-11-26 NOTE — ED Triage Notes (Signed)
Pt arrives via EMS. Pt went to HD and sitting in waiting room when he had "some sort of episode". Pt denies LOC. Pt states he had some epigastric pain that radiates to chest.  Legally blind, alert and oriented X4   200/90 P74 RR 22 100% RA CBG 180

## 2019-11-26 NOTE — ED Notes (Signed)
Pt to ultrasound

## 2019-11-26 NOTE — ED Notes (Signed)
Called [facility] to activate a code stroke per dr. Ralene Bathe

## 2019-11-26 NOTE — ED Notes (Signed)
Pt reports pain that feels like epigastric pain.  Pt reports having dialysis. Pt reports that he takes bp medications. Pt awake,alert, oriented.

## 2019-11-26 NOTE — ED Notes (Signed)
Wrong pt this pt is not a code stroke

## 2019-11-26 NOTE — ED Notes (Signed)
[phone], Elijah White daughter would like an update.

## 2019-11-26 NOTE — Progress Notes (Addendum)
Pt admitted from ED. Daughter at bedside, all questions answered. Scheduled medications administered and PRN due to high blood pressure.  Patient has a wallet with money in it, advised about our policy, will send it with daughter home.  Paged Admitting MD and asked does patient still needs to be NPO, verbal order given for diet order, renal/carb modified  diet.

## 2019-11-26 NOTE — H&P (Signed)
History and Physical    EMIT KUENZEL IDP:[id number] DOB: 08-Nov-1940 DOA: 11/26/2019  PCP: [name], MD  Patient coming from: home I have personally briefly reviewed patient's old medical records in [place]  Chief Complaint: chest pain/epigastric pain started this am  HPI: Elijah White is a 79 y.o. 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  Patient's daughter tells me that while she was driving him to dialysis center this morning patient suddenly developed midsternal/epigastric pain, associated with shortness of breath which started getting worse at dialysis waiting room therefore EMS was called and brought patient to the emergency department for further evaluation and management.  Patient reports mild 3-4 out of 10 pain in the midepigastric region, nonradiating, indigestion type, associated with shortness of breath however denies nausea, vomiting, diarrhea, constipation, decreased appetite.  He tells me that he has lost about 10 to 12 pounds in last 39-month since he has been started on dialysis.  He denies melena, hematemesis, over-the-counter use of NSAIDs.  Patient daughters tells me that patient lives alone at home and he is independent on daily life activities.  She is concerned as patient is legally blind from left eye and head recent fall with bruises on his arm.  She is concerned that patient is not safe to live alone at home.  No history of headache, blurry vision, recent head trauma, seizures, loss of consciousness, leg swelling, palpitation, cough, congestion, fever, chills, urinary symptoms.  No history of smoking, alcohol, illicit drug use.  He is fully vaccinated against COVID-19.   ED Course:  Upon arrival to ED: Patient's vital signs stable, afebrile, no leukocytosis, maintaining oxygen saturation on room air, initial troponin 36 trended up to 37, EKG: No acute changes, COVID-19 negative.  CT head negative for acute findings, lipase: WNL.  CTA chest: Negative for PE or aortic dissection.  Concerning for 2.7 cm in size transverse colon mass, 2.7 cm adrenal mass.  Chest x-ray shows pulmonary vascular congestion without pulmonary edema, mild cardiomegaly.  EDP consulted nephrology.  [facility] hospitalist consulted for admission for chest pain rule out ACS.  Review of Systems: As per HPI otherwise negative.    Past Medical History:  Diagnosis Date  . Anemia    low iron  . Arthritis   . Cancer [facility]) ?2006   prostate  . Chronic kidney disease    Stage 4  . Diabetes mellitus without complication ([place])   . Elevated cholesterol with high triglycerides   . GERD (gastroesophageal reflux disease)   . Hypertension   . Legally blind in left eye, as defined in [place]    has pinpoint vision in right eye    Past Surgical History:  Procedure Laterality Date  . AV FISTULA PLACEMENT Left 03/20/2018   Procedure: ARTERIOVENOUS (AV) FISTULA CREATION ARM;  Surgeon: [name], MD;  Location: [place];  Service: Vascular;  Laterality: Left;  . BASCILIC VEIN TRANSPOSITION Left 05/21/2018   Procedure: LEFT BASILIC VEIN FISTULA SECOND STAGE;  Surgeon: [name], MD;  Location: [place];  Service: Vascular;  Laterality: Left;  . COLONOSCOPY    . GLAUCOMA SURGERY  2019   multiple surgeries  . KNEE ARTHROSCOPY    . PROSTATE SURGERY       reports that he quit smoking  about 20 months ago. He smoked 0.50 packs per day. He has never used smokeless tobacco. He reports that he does not drink alcohol and does not use drugs.  No Known Allergies  Family History  Problem Relation Age of Onset  . CAD Mother     Prior to Admission medications   Medication Sig Start Date End Date Taking?  Authorizing Provider  acetaminophen (TYLENOL) 500 MG tablet Take 2 tablets (1,000 mg total) by mouth every 6 (six) hours as needed for mild pain, moderate pain or headache. 11/20/19   Zigmund Gottron, NP  apraclonidine (IOPIDINE) 0.5 % ophthalmic solution SMARTSIG:1 Drop(s) Right Eye Every 12 Hours 08/04/19   [provider]  atorvastatin (LIPITOR) 10 MG tablet TAKE 1 TABLET BY MOUTH EVERY DAY 11/23/19   [name], MD  AURYXIA 1 GM 210 MG(Fe) tablet Take 210 mg by mouth 3 (three) times daily. 07/20/19   [provider]  B Complex-C-Folic Acid (DIALYVITE 417) 0.8 MG TABS Take 1 tablet by mouth daily. 05/08/19   [provider]  BD PEN NEEDLE NANO U/F 32G X 4 MM MISC USE SUBCUTANEOUS FOUR TIMES A DAY 03/31/18   [provider]  bimatoprost (LUMIGAN) 0.01 % SOLN Place 1 drop into the right eye at bedtime. 06/25/17   [provider]  blood glucose meter kit and supplies KIT Dispense based on patient and insurance preference. Use up to four times daily as directed. (FOR ICD-9 250.00, 250.01). For QAC - HS accuchecks. 03/30/18   Thurnell Lose, MD  brimonidine (ALPHAGAN) 0.15 % ophthalmic solution Place 1 drop into the right eye 3 (three) times daily.  08/02/17   [provider]  carvedilol (COREG) 3.125 MG tablet TAKE 1 TABLET(3.125 MG) BY MOUTH TWICE DAILY WITH A MEAL 11/02/19   [name], MD  diclofenac Sodium (VOLTAREN) 1 % GEL Apply 4 g topically 4 (four) times daily as needed. 11/20/19   Zigmund Gottron, NP  dorzolamide (TRUSOPT) 2 % ophthalmic solution 1 drop daily. 04/21/19   [provider]  dorzolamide-timolol (COSOPT) 22.3-6.8 MG/ML ophthalmic solution Place 1 drop into the right eye 2 (two) times daily. 06/25/17   [provider]  esomeprazole (NEXIUM) 40 MG capsule TAKE 1 CAPSULE BY MOUTH EVERY DAY 06/14/19   [name], MD  gabapentin (NEURONTIN) 100 MG capsule TAKE 1 CAPSULE(100 MG) BY MOUTH AT BEDTIME 10/12/19   Minette Brine, FNP  hydrALAZINE (APRESOLINE) 25 MG tablet TAKE 1 TABLET(25 MG) BY MOUTH TWICE DAILY 06/25/19   [name], MD  isosorbide mononitrate (IMDUR) 30 MG 24 hr tablet TAKE 1 TABLET(30 MG) BY MOUTH DAILY 11/02/19   [name], MD  Lancets [facility] DELICA PLUS [id number]) MISC USE AS DIRECTED UP TO FOUR TIMES A DAY 03/31/18   [provider]  ONETOUCH VERIO test strip USE AS DIRECTED TO TEST FOUR TIMES A DAY 07/31/18   [name], MD  pilocarpine (PILOCAR) 1 % ophthalmic solution 1 drop daily. 04/21/19   [provider]  pilocarpine (PILOCAR) 4 % ophthalmic solution Place 1 drop into the right eye 3 (three) times daily.    [provider]    Physical Exam: Vitals:   11/26/19 1245 11/26/19 1300 11/26/19 1400 11/26/19 1430  BP: (!) 143/71 (!) 158/68    Pulse: 72 72 74 69  Resp: 20 16 (!) 21 18  SpO2: 100% 100% 98% 100%    Constitutional: NAD, calm, comfortable, on room air, communicating well Eyes: PERRL, lids and  conjunctivae normal ENMT: Mucous membranes are moist. Posterior pharynx clear of any exudate or lesions.Normal dentition.  Neck: normal, supple, no masses, no thyromegaly Respiratory: clear to auscultation bilaterally, no wheezing, no crackles. Normal respiratory effort. No accessory muscle use.  Cardiovascular: Regular rate and rhythm, no murmurs / rubs / gallops. No extremity edema. 2+ pedal pulses. No carotid bruits.  Abdomen: no tenderness, no masses palpated. No hepatosplenomegaly. Bowel sounds positive.  Musculoskeletal: no clubbing / cyanosis. No joint deformity upper and lower extremities. Good ROM, no contractures. Normal muscle tone.  Skin: no rashes, lesions, ulcers. No induration Neurologic: CN 2-12 grossly intact. Sensation intact, DTR normal. Strength 5/5 in all 4.  Psychiatric: Normal judgment and insight. Alert and oriented x 3. Normal mood.    Labs on Admission: I have personally reviewed following labs and imaging  studies  CBC: Recent Labs  Lab 11/26/19 1112  WBC 7.6  NEUTROABS 4.0  HGB 11.7*  HCT 36.8*  MCV 104.8*  PLT 716   Basic Metabolic Panel: Recent Labs  Lab 11/26/19 1112  NA 140  K 4.1  CL 100  CO2 29  GLUCOSE 199*  BUN 27*  CREATININE 6.92*  CALCIUM 9.8   GFR: Estimated Creatinine Clearance: 7.2 mL/min (A) (by C-G formula based on SCr of 6.92 mg/dL (H)). Liver Function Tests: Recent Labs  Lab 11/26/19 1112  AST 19  ALT 15  ALKPHOS 99  BILITOT 0.5  PROT 7.0  ALBUMIN 3.7   Recent Labs  Lab 11/26/19 1112  LIPASE 29   No results for input(s): AMMONIA in the last 168 hours. Coagulation Profile: Recent Labs  Lab 11/26/19 1112  INR 1.0   Cardiac Enzymes: No results for input(s): CKTOTAL, CKMB, CKMBINDEX, TROPONINI in the last 168 hours. BNP (last 3 results) No results for input(s): PROBNP in the last 8760 hours. HbA1C: No results for input(s): HGBA1C in the last 72 hours. CBG: No results for input(s): GLUCAP in the last 168 hours. Lipid Profile: No results for input(s): CHOL, HDL, LDLCALC, TRIG, CHOLHDL, LDLDIRECT in the last 72 hours. Thyroid Function Tests: No results for input(s): TSH, T4TOTAL, FREET4, T3FREE, THYROIDAB in the last 72 hours. Anemia Panel: No results for input(s): VITAMINB12, FOLATE, FERRITIN, TIBC, IRON, RETICCTPCT in the last 72 hours. Urine analysis:    Component Value Date/Time   COLORURINE YELLOW 10/20/2017 1145   APPEARANCEUR CLOUDY (A) 10/20/2017 1145   LABSPEC 1.011 10/20/2017 1145   PHURINE 5.0 10/20/2017 1145   GLUCOSEU NEGATIVE 10/20/2017 1145   HGBUR SMALL (A) 10/20/2017 1145   BILIRUBINUR negative 02/19/2018 1643   KETONESUR NEGATIVE 10/20/2017 1145   PROTEINUR Positive (A) 02/19/2018 1643   PROTEINUR >=300 (A) 10/20/2017 1145   UROBILINOGEN 0.2 02/19/2018 1643   UROBILINOGEN 1.0 08/22/2012 1402   NITRITE negative 02/19/2018 1643   NITRITE NEGATIVE 10/20/2017 1145   LEUKOCYTESUR Negative 02/19/2018 1643     Radiological Exams on Admission: CT Head Wo Contrast  Result Date: 11/26/2019 CLINICAL DATA:  Mental status change EXAM: CT HEAD WITHOUT CONTRAST TECHNIQUE: Contiguous axial images were obtained from the base of the skull through the vertex without intravenous contrast. COMPARISON:  CT head 10/20/2017 FINDINGS: Brain: Cerebral ventricle sizes are concordant with the degree of cerebral volume loss. Patchy and confluent areas of decreased attenuation are noted throughout the deep and periventricular white matter of the cerebral hemispheres bilaterally, compatible with chronic microvascular ischemic disease. No evidence of large-territorial acute infarction. No parenchymal hemorrhage. No mass lesion. No extra-axial collection. No mass effect  or midline shift. No hydrocephalus. Basilar cisterns are patent. Vascular: No hyperdense vessel or unexpected calcification. Skull: Interval increase in size of a 5.7 x 2.5 cm fluid density lesion within the subcutaneus soft tissues of the occipital scalp. The lesion has slightly thick walls and persistent slight heterogeneity within. This finding likely represents sebaceous cyst. Negative for fracture or focal lesion. Sinuses/Orbits: Paranasal sinuses and mastoid air cells are clear. Status post bilateral lens replacement. Redemonstration of left glaucoma drainage device. Similar-appearing 1.2 cm fat density lesion lateral to the right orbit anterior to the zygomatic bone that may be surgical versus a benign finding (4:12). Otherwise the orbits are unremarkable. Other: Atherosclerotic calcifications are present within the cavernous internal carotid arteries and bilateral vertebral arteries. IMPRESSION: No acute intracranial abnormality. Electronically Signed   By: Iven Finn M.D.   On: 11/26/2019 15:34   DG Chest Port 1 View  Result Date: 11/26/2019 CLINICAL DATA:  Chest pain. EXAM: PORTABLE CHEST 1 VIEW COMPARISON:  03/25/2018 FINDINGS: Mild enlargement of the  cardiac silhouette, similar to prior. Pulmonary vascular congestion. No evidence of pulmonary edema. No consolidation. No visible pleural effusions. No pneumothorax. The visualized skeletal structures are unremarkable. IMPRESSION: 1. Pulmonary vascular congestion without evidence of pulmonary edema. 2. Similar mild cardiomegaly. Electronically Signed   By: Margaretha Sheffield MD   On: 11/26/2019 11:50   CT Angio Chest/Abd/Pel for Dissection W and/or W/WO  Result Date: 11/26/2019 CLINICAL DATA:  Chest and back pain EXAM: CT ANGIOGRAPHY CHEST, ABDOMEN AND PELVIS TECHNIQUE: Non-contrast CT of the chest was initially obtained. Multidetector CT imaging through the chest, abdomen and pelvis was performed using the standard protocol during bolus administration of intravenous contrast. Multiplanar reconstructed images and MIPs were obtained and reviewed to evaluate the vascular anatomy. CONTRAST:  173mL OMNIPAQUE IOHEXOL 350 MG/ML SOLN COMPARISON:  11/26/2019 FINDINGS: CTA CHEST FINDINGS Cardiovascular: The thoracic aorta is unremarkable without aneurysm or dissection. Moderate atherosclerosis of the aortic arch and at the origin of the great vessels. The heart is unremarkable without pericardial effusion. Calcification of the mitral and aortic valves are noted. Moderate atherosclerosis of the coronary vessels. While not optimized for opacification of the pulmonary vasculature, there is sufficient contrast enhancement to exclude pulmonary emboli. Mediastinum/Nodes: No enlarged mediastinal, hilar, or axillary lymph nodes. Thyroid gland, trachea, and esophagus demonstrate no significant findings. Lungs/Pleura: No acute airspace disease, effusion, or pneumothorax. The central airways are patent. Musculoskeletal: No acute or destructive bony lesions. Reconstructed images demonstrate no additional findings. Review of the MIP images confirms the above findings. CTA ABDOMEN AND PELVIS FINDINGS VASCULAR Aorta: Normal caliber  aorta without aneurysm, dissection, vasculitis or significant stenosis. Celiac: Patent without evidence of aneurysm, dissection, vasculitis or significant stenosis. SMA: Patent without evidence of aneurysm, dissection, vasculitis or significant stenosis. Renals: There is severe atherosclerosis of the bilateral renal arteries, left greater than right. Multifocal high-grade stenosis at the origin of the left renal artery. IMA: Patent without evidence of aneurysm, dissection, vasculitis or significant stenosis. Inflow: Extensive atherosclerosis without high-grade stenosis. Veins: No obvious venous abnormality within the limitations of this arterial phase study. Review of the MIP images confirms the above findings. NON-VASCULAR Hepatobiliary: The gallbladder is markedly distended with numerous small calcified gallstones layering dependently. No gallbladder wall thickening or surrounding inflammation to suggest cholecystitis. The liver is unremarkable. No biliary dilation. Pancreas: Unremarkable. No pancreatic ductal dilatation or surrounding inflammatory changes. Spleen: Normal in size without focal abnormality. Adrenals/Urinary Tract: 2.7 cm indeterminate left adrenal mass. Right adrenals wrist unremarkable. There  is bilateral renal cortical thinning and scarring. Otherwise normal enhancement. There is distension of the bilateral renal pelves and bilateral ureters, without clear cause for obstruction. This could be sequela of chronic bladder outlet obstruction. No filling defects within the urinary bladder. Stomach/Bowel: No bowel obstruction or ileus. There is an enhancing 2.3 cm mass within the lumen of the mid transverse colon, reference axial image 97 and coronal image 36, concerning for neoplasm. Colonoscopy recommended. The bladder is unremarkable. No bowel wall thickening or inflammatory change. Lymphatic: No pathologic adenopathy. Reproductive: Radiotherapy seeds are seen within the prostate. Prostate is mildly  enlarged. Other: No free fluid or free gas. No abdominal wall hernia. Musculoskeletal: No acute or destructive bony lesions. Reconstructed images demonstrate no additional findings. Review of the MIP images confirms the above findings. IMPRESSION: 1. No evidence of thoracic or abdominal aortic aneurysm or dissection. No evidence of pulmonary embolus. 2. A 2.3 cm enhancing mass within the lumen of the mid transverse colon, concerning for neoplasm. Colonoscopy recommended for further evaluation. 3. Markedly distended gallbladder with numerous small calcified gallstones layering dependently. No evidence of cholecystitis. 4. 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 If further evaluation is warranted, nonemergent MRI could be performed. 5. Fullness of the bilateral renal pelves and ureters, without clear cause for obstruction. This could be sequela of chronic bladder outlet obstruction given the enlarged prostate. 6. Aortic Atherosclerosis (ICD10-I70.0). Electronically Signed   By: Randa Ngo M.D.   On: 11/26/2019 15:34    EKG: Independently reviewed.  Sinus rhythm, right bundle branch block.  No ST elevation or depression noted.  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 -Patient's vital signs stable, maintaining oxygen saturation on room air, afebrile, initial troponin: 36 trended up to 37.  Reviewed EKG, COVID-19 negative.  Chest x-ray shows pulmonary vascular congestion without pulmonary edema, mild cardiomegaly. -Admit patient on telemetry.  Trend troponin. -We will start PPI IV daily  ESRD on hemodialysis:  TTS: Patient missed dialysis appointment this morning due to chest pain.  Nephrology has been consulted for dialysis.  Patient appears euvolemic on exam.  Potassium: WNL.  Transverse colon mass: 2.3 cm in size.  Incidental finding. -We will consult GI for further recommendations -Patient denies melena, decreased appetite however reports weight loss of 10 to 12 pounds in last 7 months.  Had colonoscopy in 2012-polyps were removed.  Hypertension: Stable -Continue home meds Coreg, hydralazine, Imdur.  Continue to monitor blood pressure closely. -Hydralazine as needed for blood pressure more than 160/100.  Diabetes mellitus: -Start patient on very sensitive sliding scale insulin.  Monitor blood sugar closely  GERD: Continue PPI  Diabetic neuropathy: Continue gabapentin  Chronic diastolic CHF: Patient appears euvolemic on exam. -Continue Imdur, statin, Coreg -Reviewed echo from 09/02/2017 which showed ejection fraction of 65 to 70% and grade 1 diastolic dysfunction. -Strict INO's and daily weight.  Monitor signs of fluid overload.  Hyperlipidemia: Continue statin  Macrocytic anemia: H&H is currently stable. 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  Incidental finding. -Follow-up outpatient  Distended gallbladder with small gallstones: No evidence of cholecystitis on  CT chest. -Right upper quadrant ultrasound has been ordered and is pending.  Lipase: WNL.  EDP Consulted case manager for SNF placement as per daughter's request.  Unable to safely start patient's home medication as med reconciliation is pending by pharmacy.  DVT prophylaxis: Heparin/SCD Code Status: DNR-confirmed with patient's daughter  family Communication: None present at bedside.  Plan of care discussed with patient in length and he verbalized understanding and agreed with it.  I called patient's daughter [name]-discussed plan of care. She wants GI recommendations but does not wants invasive  intervention at this time.  She said that she will discuss with her dad and keep [place] posted.    Disposition Plan: SNF in 2 to 3 days Consults called: Nephrology and GI Admission status: Inpatient   Mckinley Jewel MD [facility]  If 7PM-7AM, please contact night-coverage [email or web address] Password [facility]  11/26/2019, 3:55 PM

## 2019-11-26 NOTE — ED Provider Notes (Signed)
[facility] EMERGENCY DEPARTMENT Provider Note   CSN: [id number] Arrival date & time: 11/26/19  1057     History Chief Complaint  Patient presents with  . Weakness    Elijah White is a 79 y.o. male.  The history is provided by the patient, medical records, a relative and the EMS personnel. No language interpreter was used.  Weakness  Elijah White is a 79 y.o. male who presents to the Emergency Department complaining of weakness and chest pain. He presents the emergency department complaining of weakness and chest pain. He has ESR D and dialyzed is Tuesday, Thursday, Saturday. His last full dialysis session was on Tuesday. Yesterday he reports developing intermittent chest discomfort. Today symptoms worsened. He has chest discomfort that radiates to his abdomen that he describes as an indigestion type sensation. No fevers, cough, nausea, vomiting, abdominal pain. He does have shortness of breath with exertion. He lives at home alone. He is legally blind. No known sick contacts. He has been fully vaccinated for COVID-19. No prior similar symptoms.    Past Medical History:  Diagnosis Date  . Anemia    low iron  . Arthritis   . Cancer [facility]) ?2006   prostate  . Chronic kidney disease    Stage 4  . Diabetes mellitus without complication ([place])   . Elevated cholesterol with high triglycerides   . GERD (gastroesophageal reflux disease)   . Hypertension   . Legally blind in left eye, as defined in [place]    has pinpoint vision in right eye    Patient Active Problem List   Diagnosis Date Noted  . ESRD (end stage renal disease) ([place]) 11/26/2019  . Macrocytic anemia 11/26/2019  . 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 Chest pain 11/26/2019  . ARF (acute renal failure) ([place]) 03/25/2018  . Acute on chronic diastolic CHF (congestive heart failure) ([place]) 03/25/2018  . 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 Vision loss of right eye  11/11/2017  . Acute decompensated heart failure ([place]) 09/01/2017  . Bilateral pseudophakia 07/11/2017  . 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 GERD (gastroesophageal reflux disease) 09/10/2016  . HLD (hyperlipidemia) 09/10/2016  . Dizziness 08/13/2016  . Renal failure (ARF), acute on chronic (HCC) 08/13/2016  . Essential hypertension 08/13/2016  . Cataract, nuclear 10/29/2012  . 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  . Type 2 diabetes mellitus with renal manifestations ([place]) 10/22/2006  . BENIGN PROSTATIC HYPERTROPHY 10/22/2006  . COLONIC POLYPS, HX OF 10/22/2006    Past Surgical History:  Procedure Laterality Date  . AV FISTULA PLACEMENT Left 03/20/2018   Procedure: ARTERIOVENOUS (AV) FISTULA CREATION ARM;  Surgeon: [name], MD;  Location: [facility];  Service: Vascular;  Laterality: Left;  . BASCILIC VEIN TRANSPOSITION Left 05/21/2018   Procedure: LEFT BASILIC VEIN FISTULA SECOND STAGE;  Surgeon: [name], MD;  Location: [facility];  Service: Vascular;  Laterality: Left;  . COLONOSCOPY    . GLAUCOMA SURGERY  2019   multiple surgeries  . KNEE ARTHROSCOPY    . PROSTATE SURGERY         Family History  Problem Relation Age of Onset  . CAD Mother     Social History   Tobacco Use  . Smoking status: Former Smoker    Packs/day: 0.50    Quit date: 03/23/2018    Years since quitting: 1.6  . Smokeless tobacco: Never Used  Vaping Use  .  Vaping Use: Never used  Substance Use Topics  . Alcohol use: No  . Drug use: No    Home Medications Prior to Admission medications   Medication Sig Start Date End Date Taking? Authorizing Provider  acetaminophen (TYLENOL) 500 MG tablet Take 2 tablets (1,000 mg total) by mouth every 6 (six) hours as needed for mild pain, moderate pain or headache. 11/20/19   Zigmund Gottron, NP  apraclonidine (IOPIDINE) 0.5 % ophthalmic solution SMARTSIG:1 Drop(s) Right Eye Every 12 Hours 08/04/19   [provider]   atorvastatin (LIPITOR) 10 MG tablet TAKE 1 TABLET BY MOUTH EVERY DAY 11/23/19   [name], MD  AURYXIA 1 GM 210 MG(Fe) tablet Take 210 mg by mouth 3 (three) times daily. 07/20/19   [provider]  B Complex-C-Folic Acid (DIALYVITE 846) 0.8 MG TABS Take 1 tablet by mouth daily. 05/08/19   [provider]  BD PEN NEEDLE NANO U/F 32G X 4 MM MISC USE SUBCUTANEOUS FOUR TIMES A DAY 03/31/18   [provider]  bimatoprost (LUMIGAN) 0.01 % SOLN Place 1 drop into the right eye at bedtime. 06/25/17   [provider]  blood glucose meter kit and supplies KIT Dispense based on patient and insurance preference. Use up to four times daily as directed. (FOR ICD-9 250.00, 250.01). For QAC - HS accuchecks. 03/30/18   Thurnell Lose, MD  brimonidine (ALPHAGAN) 0.15 % ophthalmic solution Place 1 drop into the right eye 3 (three) times daily.  08/02/17   [provider]  carvedilol (COREG) 3.125 MG tablet TAKE 1 TABLET(3.125 MG) BY MOUTH TWICE DAILY WITH A MEAL 11/02/19   [name], MD  diclofenac Sodium (VOLTAREN) 1 % GEL Apply 4 g topically 4 (four) times daily as needed. 11/20/19   Zigmund Gottron, NP  dorzolamide (TRUSOPT) 2 % ophthalmic solution 1 drop daily. 04/21/19   [provider]  dorzolamide-timolol (COSOPT) 22.3-6.8 MG/ML ophthalmic solution Place 1 drop into the right eye 2 (two) times daily. 06/25/17   [provider]  esomeprazole (NEXIUM) 40 MG capsule TAKE 1 CAPSULE BY MOUTH EVERY DAY 06/14/19   [name], MD  gabapentin (NEURONTIN) 100 MG capsule TAKE 1 CAPSULE(100 MG) BY MOUTH AT BEDTIME 10/12/19   Minette Brine, FNP  hydrALAZINE (APRESOLINE) 25 MG tablet TAKE 1 TABLET(25 MG) BY MOUTH TWICE DAILY 06/25/19   [name], MD  isosorbide mononitrate (IMDUR) 30 MG 24 hr tablet TAKE 1 TABLET(30 MG) BY MOUTH DAILY 11/02/19   [name], MD  Lancets [facility] DELICA PLUS [id number]) MISC USE AS DIRECTED UP TO FOUR TIMES A DAY 03/31/18    [provider]  ONETOUCH VERIO test strip USE AS DIRECTED TO TEST FOUR TIMES A DAY 07/31/18   [name], MD  pilocarpine (PILOCAR) 1 % ophthalmic solution 1 drop daily. 04/21/19   [provider]  pilocarpine (PILOCAR) 4 % ophthalmic solution Place 1 drop into the right eye 3 (three) times daily.    [provider]    Allergies    Patient has no known allergies.  Review of Systems   Review of Systems  Neurological: Positive for weakness.  All other systems reviewed and are negative.   Physical Exam Updated Vital Signs BP (!) 158/68   Pulse 69   Resp 18   SpO2 100%   Physical Exam Vitals and nursing note reviewed.  Constitutional:      Appearance: He is well-developed.  HENT:     Head: Normocephalic and atraumatic.  Cardiovascular:     Rate and Rhythm: Normal rate and regular rhythm.     Heart sounds: No murmur heard.   Pulmonary:     Effort: Pulmonary effort is normal. No respiratory distress.     Breath sounds: Normal breath sounds.  Abdominal:     Palpations: Abdomen is soft.     Tenderness: There is no guarding or rebound.     Comments: Moderate epigastric tenderness.  Musculoskeletal:        General: No swelling or tenderness.     Comments: Fistula in the left upper arm with palpable thrill.  Skin:    General: Skin is warm and dry.  Neurological:     Mental Status: He is alert and oriented to person, place, and time.  Psychiatric:        Behavior: Behavior normal.     ED Results / Procedures / Treatments   Labs (all labs ordered are listed, but only abnormal results are displayed) Labs Reviewed  COMPREHENSIVE METABOLIC PANEL - Abnormal; Notable for the following components:      Result Value   Glucose, Bld 199 (*)    BUN 27 (*)    Creatinine, Ser 6.92 (*)    GFR calc non Af Amer 7 (*)    GFR calc Af Amer 8 (*)    All other components within normal limits  CBC WITH DIFFERENTIAL/PLATELET - Abnormal; Notable for the  following components:   RBC 3.51 (*)    Hemoglobin 11.7 (*)    HCT 36.8 (*)    MCV 104.8 (*)    Monocytes Absolute 1.3 (*)    All other components within normal limits  TROPONIN I (HIGH SENSITIVITY) - Abnormal; Notable for the following components:   Troponin I (High Sensitivity) 36 (*)    All other components within normal limits  TROPONIN I (HIGH SENSITIVITY) - Abnormal; Notable for the following components:   Troponin I (High Sensitivity) 37 (*)    All other components within normal limits  RESPIRATORY PANEL BY RT PCR (FLU A&B, COVID)  LIPASE, BLOOD  PROTIME-INR  VITAMIN B12  FOLATE  HEMOGLOBIN A1C  CBC  CREATININE, SERUM  MAGNESIUM  TSH  I-STAT CHEM 8, ED  TROPONIN I (HIGH SENSITIVITY)    EKG EKG Interpretation  Date/Time:  Thursday November 26 2019 11:46:58 EDT Ventricular Rate:  71 PR Interval:    QRS Duration: 123 QT Interval:  428 QTC Calculation: 466 R Axis:   79 Text Interpretation: Sinus rhythm Right bundle branch block Confirmed by Quintella Reichert [phone]) on 11/26/2019 12:36:34 PM   Radiology CT Head Wo Contrast  Result Date: 11/26/2019 CLINICAL DATA:  Mental status change EXAM: CT HEAD WITHOUT CONTRAST TECHNIQUE: Contiguous axial images were obtained from the base of the skull through the vertex without intravenous contrast. COMPARISON:  CT head 10/20/2017 FINDINGS: Brain: Cerebral ventricle sizes are concordant with the degree of cerebral volume loss. Patchy and confluent areas of decreased attenuation are noted throughout the deep and periventricular white matter of the cerebral hemispheres bilaterally, compatible with chronic microvascular ischemic disease. No evidence of large-territorial acute infarction. No parenchymal hemorrhage. No mass lesion. No extra-axial collection. No mass effect or midline shift. No hydrocephalus. Basilar cisterns are patent. Vascular: No hyperdense vessel or unexpected calcification. Skull: Interval increase in size of a 5.7 x  2.5 cm fluid density lesion within the subcutaneus soft tissues of the occipital scalp. The lesion has slightly thick walls and persistent slight heterogeneity within. This finding likely represents  sebaceous cyst. Negative for fracture or focal lesion. Sinuses/Orbits: Paranasal sinuses and mastoid air cells are clear. Status post bilateral lens replacement. Redemonstration of left glaucoma drainage device. Similar-appearing 1.2 cm fat density lesion lateral to the right orbit anterior to the zygomatic bone that may be surgical versus a benign finding (4:12). Otherwise the orbits are unremarkable. Other: Atherosclerotic calcifications are present within the cavernous internal carotid arteries and bilateral vertebral arteries. IMPRESSION: No acute intracranial abnormality. Electronically Signed   By: Iven Finn M.D.   On: 11/26/2019 15:34   DG Chest Port 1 View  Result Date: 11/26/2019 CLINICAL DATA:  Chest pain. EXAM: PORTABLE CHEST 1 VIEW COMPARISON:  03/25/2018 FINDINGS: Mild enlargement of the cardiac silhouette, similar to prior. Pulmonary vascular congestion. No evidence of pulmonary edema. No consolidation. No visible pleural effusions. No pneumothorax. The visualized skeletal structures are unremarkable. IMPRESSION: 1. Pulmonary vascular congestion without evidence of pulmonary edema. 2. Similar mild cardiomegaly. Electronically Signed   By: Margaretha Sheffield MD   On: 11/26/2019 11:50   CT Angio Chest/Abd/Pel for Dissection W and/or W/WO  Result Date: 11/26/2019 CLINICAL DATA:  Chest and back pain EXAM: CT ANGIOGRAPHY CHEST, ABDOMEN AND PELVIS TECHNIQUE: Non-contrast CT of the chest was initially obtained. Multidetector CT imaging through the chest, abdomen and pelvis was performed using the standard protocol during bolus administration of intravenous contrast. Multiplanar reconstructed images and MIPs were obtained and reviewed to evaluate the vascular anatomy. CONTRAST:  162m OMNIPAQUE IOHEXOL  350 MG/ML SOLN COMPARISON:  11/26/2019 FINDINGS: CTA CHEST FINDINGS Cardiovascular: The thoracic aorta is unremarkable without aneurysm or dissection. Moderate atherosclerosis of the aortic arch and at the origin of the great vessels. The heart is unremarkable without pericardial effusion. Calcification of the mitral and aortic valves are noted. Moderate atherosclerosis of the coronary vessels. While not optimized for opacification of the pulmonary vasculature, there is sufficient contrast enhancement to exclude pulmonary emboli. Mediastinum/Nodes: No enlarged mediastinal, hilar, or axillary lymph nodes. Thyroid gland, trachea, and esophagus demonstrate no significant findings. Lungs/Pleura: No acute airspace disease, effusion, or pneumothorax. The central airways are patent. Musculoskeletal: No acute or destructive bony lesions. Reconstructed images demonstrate no additional findings. Review of the MIP images confirms the above findings. CTA ABDOMEN AND PELVIS FINDINGS VASCULAR Aorta: Normal caliber aorta without aneurysm, dissection, vasculitis or significant stenosis. Celiac: Patent without evidence of aneurysm, dissection, vasculitis or significant stenosis. SMA: Patent without evidence of aneurysm, dissection, vasculitis or significant stenosis. Renals: There is severe atherosclerosis of the bilateral renal arteries, left greater than right. Multifocal high-grade stenosis at the origin of the left renal artery. IMA: Patent without evidence of aneurysm, dissection, vasculitis or significant stenosis. Inflow: Extensive atherosclerosis without high-grade stenosis. Veins: No obvious venous abnormality within the limitations of this arterial phase study. Review of the MIP images confirms the above findings. NON-VASCULAR Hepatobiliary: The gallbladder is markedly distended with numerous small calcified gallstones layering dependently. No gallbladder wall thickening or surrounding inflammation to suggest  cholecystitis. The liver is unremarkable. No biliary dilation. Pancreas: Unremarkable. No pancreatic ductal dilatation or surrounding inflammatory changes. Spleen: Normal in size without focal abnormality. Adrenals/Urinary Tract: 2.7 cm indeterminate left adrenal mass. Right adrenals wrist unremarkable. There is bilateral renal cortical thinning and scarring. Otherwise normal enhancement. There is distension of the bilateral renal pelves and bilateral ureters, without clear cause for obstruction. This could be sequela of chronic bladder outlet obstruction. No filling defects within the urinary bladder. Stomach/Bowel: No bowel obstruction or ileus. There is an enhancing 2.3 cm  mass within the lumen of the mid transverse colon, reference axial image 97 and coronal image 36, concerning for neoplasm. Colonoscopy recommended. The bladder is unremarkable. No bowel wall thickening or inflammatory change. Lymphatic: No pathologic adenopathy. Reproductive: Radiotherapy seeds are seen within the prostate. Prostate is mildly enlarged. Other: No free fluid or free gas. No abdominal wall hernia. Musculoskeletal: No acute or destructive bony lesions. Reconstructed images demonstrate no additional findings. Review of the MIP images confirms the above findings. IMPRESSION: 1. No evidence of thoracic or abdominal aortic aneurysm or dissection. No evidence of pulmonary embolus. 2. A 2.3 cm enhancing mass within the lumen of the mid transverse colon, concerning for neoplasm. Colonoscopy recommended for further evaluation. 3. Markedly distended gallbladder with numerous small calcified gallstones layering dependently. No evidence of cholecystitis. 4. 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 If further evaluation is warranted, nonemergent MRI could be performed. 5. Fullness of the bilateral renal pelves and ureters, without clear cause for obstruction. This could be sequela of chronic  bladder outlet obstruction given the enlarged prostate. 6. Aortic Atherosclerosis (ICD10-I70.0). Electronically Signed   By: Randa Ngo M.D.   On: 11/26/2019 15:34    Procedures Procedures (including critical care time)  Medications Ordered in ED Medications  insulin aspart (novoLOG) injection 0-5 Units (has no administration in time range)  insulin aspart (novoLOG) injection 0-6 Units (has no administration in time range)  heparin injection 5,000 Units (has no administration in time range)  acetaminophen (TYLENOL) tablet 650 mg (has no administration in time range)    Or  acetaminophen (TYLENOL) suppository 650 mg (has no administration in time range)  HYDROmorphone (DILAUDID) injection 0.5-1 mg (has no administration in time range)  ondansetron (ZOFRAN) tablet 4 mg (has no administration in time range)    Or  ondansetron (ZOFRAN) injection 4 mg (has no administration in time range)  hydrALAZINE (APRESOLINE) injection 10 mg (has no administration in time range)  iohexol (OMNIPAQUE) 350 MG/ML injection 100 mL (100 mLs Intravenous Contrast Given 11/26/19 1516)    ED Course  I have reviewed the triage vital signs and the nursing notes.  Pertinent labs & imaging results that were available during my care of the patient were reviewed by me and considered in my medical decision making (see chart for details).    MDM Rules/Calculators/A&P                         Patient with history of ESR D on dialysis here for evaluation of intermittent chest pain, shortness of breath. EKG without acute ischemic changes. He does have some upper abdominal tenderness without peritoneal findings. Given radiation of pain a CTA was obtained. CT is negative for dissection, PE. Troponin is minimally elevated. CT scan does demonstrate incidental finding of colon mass. Discussed this finding with the patient and importance of further workup. Given his exertional chest pain, medicine consulted for  admission.  Final Clinical Impression(s) / ED Diagnoses Final diagnoses:  Precordial pain  Generalized weakness  ESRD on dialysis [facility])  Abdominal pain    Rx / DC Orders ED Discharge Orders    None       Quintella Reichert, MD 11/26/19 1611

## 2019-11-27 ENCOUNTER — Encounter (HOSPITAL_COMMUNITY): Payer: Self-pay | Admitting: Internal Medicine

## 2019-11-27 DIAGNOSIS — E785 Hyperlipidemia, unspecified: Secondary | ICD-10-CM

## 2019-11-27 DIAGNOSIS — E1121 Type 2 diabetes mellitus with diabetic nephropathy: Secondary | ICD-10-CM

## 2019-11-27 DIAGNOSIS — K6389 Other specified diseases of intestine: Secondary | ICD-10-CM

## 2019-11-27 DIAGNOSIS — N186 End stage renal disease: Secondary | ICD-10-CM

## 2019-11-27 DIAGNOSIS — K219 Gastro-esophageal reflux disease without esophagitis: Secondary | ICD-10-CM

## 2019-11-27 DIAGNOSIS — E278 Other specified disorders of adrenal gland: Secondary | ICD-10-CM

## 2019-11-27 DIAGNOSIS — D539 Nutritional anemia, unspecified: Secondary | ICD-10-CM

## 2019-11-27 LAB — CBC
HCT: 36.6 % — ABNORMAL LOW (ref 39.0–52.0)
Hemoglobin: 12.1 g/dL — ABNORMAL LOW (ref 13.0–17.0)
MCH: 34.1 pg — ABNORMAL HIGH (ref 26.0–34.0)
MCHC: 33.1 g/dL (ref 30.0–36.0)
MCV: 103.1 fL — ABNORMAL HIGH (ref 80.0–100.0)
Platelets: 214 10*3/uL (ref 150–400)
RBC: 3.55 MIL/uL — ABNORMAL LOW (ref 4.22–5.81)
RDW: 13.1 % (ref 11.5–15.5)
WBC: 7 10*3/uL (ref 4.0–10.5)
nRBC: 0 % (ref 0.0–0.2)

## 2019-11-27 LAB — COMPREHENSIVE METABOLIC PANEL
ALT: 14 U/L (ref 0–44)
AST: 41 U/L (ref 15–41)
Albumin: 3.3 g/dL — ABNORMAL LOW (ref 3.5–5.0)
Alkaline Phosphatase: 72 U/L (ref 38–126)
Anion gap: 15 (ref 5–15)
BUN: 34 mg/dL — ABNORMAL HIGH (ref 8–23)
CO2: 24 mmol/L (ref 22–32)
Calcium: 9.3 mg/dL (ref 8.9–10.3)
Chloride: 100 mmol/L (ref 98–111)
Creatinine, Ser: 7.96 mg/dL — ABNORMAL HIGH (ref 0.61–1.24)
GFR calc Af Amer: 7 mL/min — ABNORMAL LOW (ref >60)
GFR calc non Af Amer: 6 mL/min — ABNORMAL LOW (ref >60)
Glucose, Bld: 132 mg/dL — ABNORMAL HIGH (ref 70–99)
Potassium: 6 mmol/L — ABNORMAL HIGH (ref 3.5–5.1)
Sodium: 139 mmol/L (ref 135–145)
Total Bilirubin: 1.3 mg/dL — ABNORMAL HIGH (ref 0.3–1.2)
Total Protein: 6 g/dL — ABNORMAL LOW (ref 6.5–8.1)

## 2019-11-27 LAB — GLUCOSE, CAPILLARY
Glucose-Capillary: 101 mg/dL — ABNORMAL HIGH (ref 70–99)
Glucose-Capillary: 120 mg/dL — ABNORMAL HIGH (ref 70–99)
Glucose-Capillary: 197 mg/dL — ABNORMAL HIGH (ref 70–99)
Glucose-Capillary: 144 mg/dL — ABNORMAL HIGH (ref 70–99)

## 2019-11-27 MED ORDER — SODIUM ZIRCONIUM CYCLOSILICATE 10 G PO PACK: 10.0000 g | PACK | Freq: Every day | ORAL | Status: DC

## 2019-11-27 MED ORDER — INSULIN ASPART 100 UNIT/ML ~~LOC~~ SOLN
0.0000 [IU] | Freq: Three times a day (TID) | SUBCUTANEOUS | Status: DC
  Administered 2019-11-28 – 2019-12-03 (×5): 1 [IU] via SUBCUTANEOUS

## 2019-11-27 MED ORDER — PILOCARPINE HCL 4 % OP SOLN
1.0000 [drp] | Freq: Four times a day (QID) | OPHTHALMIC | Status: DC
  Administered 2019-11-27 – 2019-12-03 (×22): 1 [drp] via OPHTHALMIC
  Filled 2019-11-27: qty 15

## 2019-11-27 MED ORDER — SODIUM ZIRCONIUM CYCLOSILICATE 10 G PO PACK
10.0000 g | PACK | Freq: Every day | ORAL | Status: AC
  Administered 2019-11-27: 10 g via ORAL
  Filled 2019-11-27: qty 1

## 2019-11-27 MED ORDER — APRACLONIDINE HCL 0.5 % OP SOLN
1.0000 [drp] | Freq: Three times a day (TID) | OPHTHALMIC | Status: DC
  Administered 2019-11-29 – 2019-12-03 (×13): 1 [drp] via OPHTHALMIC
  Filled 2019-11-27: qty 5

## 2019-11-27 MED ORDER — DOXERCALCIFEROL 4 MCG/2ML IV SOLN
7.0000 ug | INTRAVENOUS | Status: DC
  Administered 2019-11-28 – 2019-12-01 (×2): 7 ug via INTRAVENOUS
  Filled 2019-11-27 (×2): qty 4

## 2019-11-27 MED ORDER — CHLORHEXIDINE GLUCONATE CLOTH 2 % EX PADS
6.0000 | MEDICATED_PAD | Freq: Every day | CUTANEOUS | Status: DC
  Administered 2019-11-28 – 2019-11-30 (×4): 6 via TOPICAL

## 2019-11-27 MED ORDER — LATANOPROST 0.005 % OP SOLN
1.0000 [drp] | Freq: Every day | OPHTHALMIC | Status: DC
  Administered 2019-11-27 – 2019-12-02 (×6): 1 [drp] via OPHTHALMIC
  Filled 2019-11-27: qty 2.5

## 2019-11-27 MED ORDER — SENNOSIDES-DOCUSATE SODIUM 8.6-50 MG PO TABS
2.0000 | ORAL_TABLET | Freq: Once | ORAL | Status: AC
  Administered 2019-11-27: 2 via ORAL
  Filled 2019-11-27: qty 2

## 2019-11-27 NOTE — Consult Note (Addendum)
Referring Provider:  [facility] Primary Care Physician:  [name], MD Primary Gastroenterologist: [facility] (former patient of Dr. [name])  Reason for Consultation: Radiographic abnormality of the colon, questionable colonic mass  HPI: Elijah White is a 79 y.o. male dialysis patient with history of prostate cancer admitted through the emergency room yesterday when he developed severe epigastric and chest pain on the way to dialysis, along with shortness of breath.  Troponins were somewhat elevated, EKG did not show ischemic changes.  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  No evidence of pulmonary emboli or of mesenteric vascular occlusive disease.  At this time, the patient is comfortable.  However, his daughter indicates that he has had some spells, since admission, where he "could not get his breath."  Open to.  3 is optimistic.  The patient's GI history is pertinent for history of advanced adenomas in the past.  In 1993, he had a 5 cm villoglandular rectal polyp removed; in June 2003 he had a 1 cm cecal adenomatous polyp, with a 15 mm cecal tubulovillous adenoma noted 5 years later in October 2008.  However, his most recent colonoscopy, January 2009, was negative for any polyps.  The patient reports an adequate appetite, no ongoing abdominal pain.  There is some irregularity of bowel habit but he has not noticed any rectal bleeding.  No known cardiopulmonary disease.  He has been on dialysis for approximately the past 8 months.  He has marked visual impairment due to glaucoma.     Past Medical History:  Diagnosis Date  . Anemia    low iron  . Arthritis   . Cancer [facility]) ?2006   prostate  . Chronic kidney disease    Stage 4  . Colon polyps ~ 1993 and 2003   Dr [name], [facility].  08/2001  colonoscopy: tubular adenoma at cecum.    . Diabetes mellitus without complication ([place])   . Elevated cholesterol with high triglycerides   . GERD (gastroesophageal reflux disease)   . Hypertension   . Legally blind in left eye, as defined in [place]    has pinpoint vision in right eye    Past Surgical History:  Procedure Laterality Date  . AV FISTULA PLACEMENT Left 03/20/2018   Procedure: ARTERIOVENOUS (AV) FISTULA CREATION ARM;  Surgeon: [name], MD;  Location: [place];  Service: Vascular;  Laterality: Left;  . BASCILIC VEIN TRANSPOSITION Left 05/21/2018   Procedure: LEFT BASILIC VEIN FISTULA SECOND STAGE;  Surgeon: [name], MD;  Location: [place];  Service: Vascular;  Laterality: Left;  . COLONOSCOPY    . GLAUCOMA SURGERY  2019   multiple surgeries  . KNEE ARTHROSCOPY    . PROSTATE SURGERY      Prior to Admission medications   Medication Sig Start Date End Date Taking? Authorizing Provider  acetaminophen (TYLENOL) 500 MG tablet Take 2 tablets (1,000 mg total) by mouth every 6 (six) hours as needed for mild pain, moderate pain or headache. 11/20/19  Yes Burky, Lanelle Bal B, NP  apraclonidine (IOPIDINE) 0.5 % ophthalmic solution Place 1 drop into the right eye 3 (three) times daily.  08/04/19  Yes [provider]  atorvastatin (LIPITOR) 10 MG tablet TAKE 1 TABLET BY MOUTH EVERY DAY Patient taking differently: Take 10 mg by mouth daily.  11/23/19  Yes [name], MD  AURYXIA 1 GM 210 MG(Fe) tablet Take 210 mg by mouth 3 (three) times daily with meals.  07/20/19  Yes [provider]  B Complex-C-Folic Acid (DIALYVITE 944) 0.8 MG TABS Take 1 tablet by mouth daily. 05/08/19  Yes [provider]  carvedilol (COREG) 3.125 MG tablet TAKE 1 TABLET(3.125 MG) BY MOUTH TWICE DAILY WITH A MEAL Patient taking differently: Take 3.125 mg by mouth 2 (two) times daily with a meal.  11/02/19  Yes [name], MD  diclofenac Sodium (VOLTAREN) 1 % GEL  Apply 4 g topically 4 (four) times daily as needed. Patient taking differently: Apply 4 g topically 4 (four) times daily as needed (pain).  11/20/19  Yes Burky, Lanelle Bal B, NP  dorzolamide-timolol (COSOPT) 22.3-6.8 MG/ML ophthalmic solution Place 1 drop into the right eye 2 (two) times daily. 06/25/17  Yes [provider]  esomeprazole (NEXIUM) 40 MG capsule TAKE 1 CAPSULE BY MOUTH EVERY DAY 06/14/19  Yes [name], MD  gabapentin (NEURONTIN) 100 MG capsule TAKE 1 CAPSULE(100 MG) BY MOUTH AT BEDTIME Patient taking differently: Take 100 mg by mouth at bedtime.  10/12/19  Yes Minette Brine, FNP  hydrALAZINE (APRESOLINE) 25 MG tablet TAKE 1 TABLET(25 MG) BY MOUTH TWICE DAILY Patient taking differently: Take 25 mg by mouth in the morning and at bedtime.  06/25/19  Yes [name], MD  hydroxypropyl methylcellulose / hypromellose (ISOPTO TEARS / GONIOVISC) 2.5 % ophthalmic solution Place 1 drop into the left eye as needed for dry eyes.   Yes [provider]  isosorbide mononitrate (IMDUR) 30 MG 24 hr tablet TAKE 1 TABLET(30 MG) BY MOUTH DAILY Patient taking differently: Take 30 mg by mouth daily.  11/02/19  Yes [name], MD  latanoprost (XALATAN) 0.005 % ophthalmic solution Place 1 drop into the right eye at bedtime.   Yes [provider]  pilocarpine (PILOCAR) 4 % ophthalmic solution Place 1 drop into the right eye 4 (four) times daily.    Yes [provider]  BD PEN NEEDLE NANO U/F 32G X 4 MM MISC USE SUBCUTANEOUS FOUR TIMES A DAY 03/31/18   [provider]  blood glucose meter kit and supplies KIT Dispense based on patient and insurance preference. Use up to four times daily as directed. (FOR ICD-9 250.00, 250.01). For QAC - HS accuchecks. 03/30/18   Thurnell Lose, MD  Lancets (ONETOUCH DELICA PLUS [id number]) [place] USE AS DIRECTED UP TO FOUR TIMES A DAY 03/31/18   [provider]  ONETOUCH VERIO test strip USE AS DIRECTED TO TEST FOUR TIMES A  DAY 07/31/18   [name], MD    Current Facility-Administered Medications  Medication Dose Route Frequency Provider Last Rate Last Admin  . acetaminophen (TYLENOL) tablet 650 mg  650 mg Oral Q6H PRN Pahwani, Rinka R, MD   650 mg at 11/26/19 1824   Or  . acetaminophen (TYLENOL) suppository 650 mg  650 mg Rectal Q6H PRN Pahwani, Rinka R, MD      . gabapentin (NEURONTIN) capsule 100 mg  100 mg Oral QHS Opyd, Ilene Qua, MD   100 mg at 11/26/19 2056  . heparin injection 5,000 Units  5,000 Units Subcutaneous Q8H Pahwani, Rinka R, MD      . hydrALAZINE (APRESOLINE) injection 10 mg  10 mg Intravenous Q6H PRN Pahwani, Rinka R, MD   10 mg at 11/26/19 1827  . hydrocortisone cream 1 % 1 application  1 application Topical PRN Pahwani, Michell Heinrich, MD  1 application at 93/57/01 0000  . HYDROmorphone (DILAUDID) injection 0.5-1 mg  0.5-1 mg Intravenous Q4H PRN Pahwani, Rinka R, MD      . insulin aspart (novoLOG) injection 0-5 Units  0-5 Units Subcutaneous QHS Pahwani, Rinka R, MD   2 Units at 11/26/19 2145  . insulin aspart (novoLOG) injection 0-6 Units  0-6 Units Subcutaneous TID WC Pahwani, Rinka R, MD      . ondansetron (ZOFRAN) tablet 4 mg  4 mg Oral Q6H PRN Pahwani, Rinka R, MD       Or  . ondansetron (ZOFRAN) injection 4 mg  4 mg Intravenous Q6H PRN Pahwani, Rinka R, MD      . pantoprazole (PROTONIX) EC tablet 40 mg  40 mg Oral Daily Pahwani, Rinka R, MD   40 mg at 11/27/19 1210  . senna-docusate (Senokot-S) tablet 2 tablet  2 tablet Oral Once Ronald Lobo, MD        Allergies as of 11/26/2019  . (No Known Allergies)    Family History  Problem Relation Age of Onset  . CAD Mother     Social History   Socioeconomic History  . Marital status: Widowed    Spouse name: Not on file  . Number of children: Not on file  . Years of education: Not on file  . Highest education level: Not on file  Occupational History  . Occupation: retired  Tobacco Use  . Smoking status: Former Smoker     Packs/day: 0.50    Quit date: 03/23/2018    Years since quitting: 1.6  . Smokeless tobacco: Never Used  Vaping Use  . Vaping Use: Never used  Substance and Sexual Activity  . Alcohol use: No  . Drug use: No  . Sexual activity: Not Currently  Other Topics Concern  . Not on file  Social History Narrative  . Not on file   Social Determinants of Health   Financial Resource Strain: Low Risk   . Difficulty of Paying Living Expenses: Not hard at all  Food Insecurity: No Food Insecurity  . Worried About Charity fundraiser in the Last Year: Never true  . Ran Out of Food in the Last Year: Never true  Transportation Needs: No Transportation Needs  . Lack of Transportation (Medical): No  . Lack of Transportation (Non-Medical): No  Physical Activity: Inactive  . Days of Exercise per Week: 0 days  . Minutes of Exercise per Session: 0 min  Stress: No Stress Concern Present  . Feeling of Stress : Not at all  Social Connections:   . Frequency of Communication with Friends and Family: Not on file  . Frequency of Social Gatherings with Friends and Family: Not on file  . Attends Religious Services: Not on file  . Active Member of Clubs or Organizations: Not on file  . Attends Archivist Meetings: Not on file  . Marital Status: Not on file  Intimate Partner Violence:   . Fear of Current or Ex-Partner: Not on file  . Emotionally Abused: Not on file  . Physically Abused: Not on file  . Sexually Abused: Not on file    Review of Systems: See history of present illness  Physical Exam: Vital signs in last 24 hours: Temp:  [97.9 F (36.6 C)-98.9 F (37.2 C)] 98 F (36.7 C) (09/24 1215) Pulse Rate:  [69-85] 85 (09/24 1215) Resp:  [16-21] 20 (09/24 1215) BP: (121-177)/(61-100) 149/73 (09/24 1215) SpO2:  [97 %-100 %] 100 % (09/24 1215)  Weight:  [75.5 kg-76.2 kg] 75.5 kg (09/24 0453) Last BM Date: 11/26/19   This is a reasonably well-preserved, pleasant, coherent African  American male lying in bed in absolutely no distress.  He is anicteric and without frank pallor.  The chest has somewhat diminished breath sounds.  There is a 2/6 soft systolic ejection murmur.  The abdomen is without mass-effect or tenderness.  No peripheral edema.  No evident focal neurologic deficits.  Normal mood.  Cognition intact, for example, he could give me his daughter's telephone number from memory.  Intake/Output from previous day: No intake/output data recorded. Intake/Output this shift: Total I/O In: 240 [P.O.:240] Out: -   Lab Results: Recent Labs    11/26/19 1112 11/26/19 1816 11/27/19 1038  WBC 7.6 6.2 7.0  HGB 11.7* 12.1* 12.1*  HCT 36.8* 36.9* 36.6*  PLT 194 209 214   BMET Recent Labs    11/26/19 1112 11/26/19 1816 11/27/19 1038  NA 140  --  139  K 4.1  --  6.0*  CL 100  --  100  CO2 29  --  24  GLUCOSE 199*  --  132*  BUN 27*  --  34*  CREATININE 6.92* 7.33* 7.96*  CALCIUM 9.8  --  9.3   LFT Recent Labs    11/27/19 1038  PROT 6.0*  ALBUMIN 3.3*  AST 41  ALT 14  ALKPHOS 72  BILITOT 1.3*   PT/INR Recent Labs    11/26/19 1112  LABPROT 12.8  INR 1.0    Studies/Results: CT Head Wo Contrast  Result Date: 11/26/2019 CLINICAL DATA:  Mental status change EXAM: CT HEAD WITHOUT CONTRAST TECHNIQUE: Contiguous axial images were obtained from the base of the skull through the vertex without intravenous contrast. COMPARISON:  CT head 10/20/2017 FINDINGS: Brain: Cerebral ventricle sizes are concordant with the degree of cerebral volume loss. Patchy and confluent areas of decreased attenuation are noted throughout the deep and periventricular white matter of the cerebral hemispheres bilaterally, compatible with chronic microvascular ischemic disease. No evidence of large-territorial acute infarction. No parenchymal hemorrhage. No mass lesion. No extra-axial collection. No mass effect or midline shift. No hydrocephalus. Basilar cisterns are patent. Vascular:  No hyperdense vessel or unexpected calcification. Skull: Interval increase in size of a 5.7 x 2.5 cm fluid density lesion within the subcutaneus soft tissues of the occipital scalp. The lesion has slightly thick walls and persistent slight heterogeneity within. This finding likely represents sebaceous cyst. Negative for fracture or focal lesion. Sinuses/Orbits: Paranasal sinuses and mastoid air cells are clear. Status post bilateral lens replacement. Redemonstration of left glaucoma drainage device. Similar-appearing 1.2 cm fat density lesion lateral to the right orbit anterior to the zygomatic bone that may be surgical versus a benign finding (4:12). Otherwise the orbits are unremarkable. Other: Atherosclerotic calcifications are present within the cavernous internal carotid arteries and bilateral vertebral arteries. IMPRESSION: No acute intracranial abnormality. Electronically Signed   By: Iven Finn M.D.   On: 11/26/2019 15:34   DG Chest Port 1 View  Result Date: 11/26/2019 CLINICAL DATA:  Chest pain. EXAM: PORTABLE CHEST 1 VIEW COMPARISON:  03/25/2018 FINDINGS: Mild enlargement of the cardiac silhouette, similar to prior. Pulmonary vascular congestion. No evidence of pulmonary edema. No consolidation. No visible pleural effusions. No pneumothorax. The visualized skeletal structures are unremarkable. IMPRESSION: 1. Pulmonary vascular congestion without evidence of pulmonary edema. 2. Similar mild cardiomegaly. Electronically Signed   By: Margaretha Sheffield MD   On: 11/26/2019 11:50   CT Angio  Chest/Abd/Pel for Dissection W and/or W/WO  Result Date: 11/26/2019 CLINICAL DATA:  Chest and back pain EXAM: CT ANGIOGRAPHY CHEST, ABDOMEN AND PELVIS TECHNIQUE: Non-contrast CT of the chest was initially obtained. Multidetector CT imaging through the chest, abdomen and pelvis was performed using the standard protocol during bolus administration of intravenous contrast. Multiplanar reconstructed images and MIPs  were obtained and reviewed to evaluate the vascular anatomy. CONTRAST:  153m OMNIPAQUE IOHEXOL 350 MG/ML SOLN COMPARISON:  11/26/2019 FINDINGS: CTA CHEST FINDINGS Cardiovascular: The thoracic aorta is unremarkable without aneurysm or dissection. Moderate atherosclerosis of the aortic arch and at the origin of the great vessels. The heart is unremarkable without pericardial effusion. Calcification of the mitral and aortic valves are noted. Moderate atherosclerosis of the coronary vessels. While not optimized for opacification of the pulmonary vasculature, there is sufficient contrast enhancement to exclude pulmonary emboli. Mediastinum/Nodes: No enlarged mediastinal, hilar, or axillary lymph nodes. Thyroid gland, trachea, and esophagus demonstrate no significant findings. Lungs/Pleura: No acute airspace disease, effusion, or pneumothorax. The central airways are patent. Musculoskeletal: No acute or destructive bony lesions. Reconstructed images demonstrate no additional findings. Review of the MIP images confirms the above findings. CTA ABDOMEN AND PELVIS FINDINGS VASCULAR Aorta: Normal caliber aorta without aneurysm, dissection, vasculitis or significant stenosis. Celiac: Patent without evidence of aneurysm, dissection, vasculitis or significant stenosis. SMA: Patent without evidence of aneurysm, dissection, vasculitis or significant stenosis. Renals: There is severe atherosclerosis of the bilateral renal arteries, left greater than right. Multifocal high-grade stenosis at the origin of the left renal artery. IMA: Patent without evidence of aneurysm, dissection, vasculitis or significant stenosis. Inflow: Extensive atherosclerosis without high-grade stenosis. Veins: No obvious venous abnormality within the limitations of this arterial phase study. Review of the MIP images confirms the above findings. NON-VASCULAR Hepatobiliary: The gallbladder is markedly distended with numerous small calcified gallstones layering  dependently. No gallbladder wall thickening or surrounding inflammation to suggest cholecystitis. The liver is unremarkable. No biliary dilation. Pancreas: Unremarkable. No pancreatic ductal dilatation or surrounding inflammatory changes. Spleen: Normal in size without focal abnormality. Adrenals/Urinary Tract: 2.7 cm indeterminate left adrenal mass. Right adrenals wrist unremarkable. There is bilateral renal cortical thinning and scarring. Otherwise normal enhancement. There is distension of the bilateral renal pelves and bilateral ureters, without clear cause for obstruction. This could be sequela of chronic bladder outlet obstruction. No filling defects within the urinary bladder. Stomach/Bowel: No bowel obstruction or ileus. There is an enhancing 2.3 cm mass within the lumen of the mid transverse colon, reference axial image 97 and coronal image 36, concerning for neoplasm. Colonoscopy recommended. The bladder is unremarkable. No bowel wall thickening or inflammatory change. Lymphatic: No pathologic adenopathy. Reproductive: Radiotherapy seeds are seen within the prostate. Prostate is mildly enlarged. Other: No free fluid or free gas. No abdominal wall hernia. Musculoskeletal: No acute or destructive bony lesions. Reconstructed images demonstrate no additional findings. Review of the MIP images confirms the above findings. IMPRESSION: 1. No evidence of thoracic or abdominal aortic aneurysm or dissection. No evidence of pulmonary embolus. 2. A 2.3 cm enhancing mass within the lumen of the mid transverse colon, concerning for neoplasm. Colonoscopy recommended for further evaluation. 3. Markedly distended gallbladder with numerous small calcified gallstones layering dependently. No evidence of cholecystitis. 4. 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 If further evaluation is warranted, nonemergent MRI could be performed. 5. Fullness of the bilateral renal pelves  and ureters, without clear cause for obstruction. This could be sequela of  chronic bladder outlet obstruction given the enlarged prostate. 6. Aortic Atherosclerosis (ICD10-I70.0). Electronically Signed   By: Randa Ngo M.D.   On: 11/26/2019 15:34   US Abdomen Limited RUQ  Result Date: 11/26/2019 CLINICAL DATA:  Abdominal pain EXAM: ULTRASOUND ABDOMEN LIMITED RIGHT UPPER QUADRANT COMPARISON:  None. FINDINGS: Gallbladder: The gallbladder is filled with gallbladder sludge and multiple gallstones. The gallbladder is distended. There is a stone that appears to be wedged at the gallbladder neck. There is no gallbladder wall thickening. The sonographic Percell Miller sign is reported as negative. Common bile duct: Diameter: 3 mm Liver: There is a heterogeneous appearance of the liver parenchyma without evidence for a discrete hepatic mass. Portal vein is patent on color Doppler imaging with normal direction of blood flow towards the liver. Other: Incidentally noted is right-sided hydronephrosis. IMPRESSION: 1. Cholelithiasis without definite CT evidence for acute cholecystitis. However, the gallbladder is distended with a stone that appears to be wedged at the gallbladder neck. 2. Incidentally noted right-sided hydronephrosis. Electronically Signed   By: Constance Holster M.D.   On: 11/26/2019 17:39    Impression: 1.  Transient upper abdominal pain, resolved 2.  Extensive cholelithiasis and gallbladder distention 3.  Suggestion of small mass in transverse colon on CT scan 4.  History of advanced adenomas of the colon, most recent colonoscopy, almost 13 years ago, negative for polyps 5.  Hemodialysis, 6.  Visual impairment due to glaucoma 7.  Periodic dyspnea  Discussion: I suspect his abdominal pain could have been an episode of biliary colic.  I suspect his lesion in the transverse colon is an advanced polyp.  Plan: 1.  The patient is due for dialysis later today, so we will plan a prep for colonoscopy  tomorrow and probable colonoscopy on Sunday.  Purpose and risks of the procedure were reviewed with the patient, and his daughter, [name] [phone]) by telephone. 2.  Concerning his extensive cholelithiasis, I would favor watchful waiting with consideration of either surgical consultation or a trial of ursodeoxycholic acid if he has recurrent attacks of pain.  I discussed this with the patient's daughter and she is agreeable.   LOS: 1 day   Youlanda Mighty   11/27/2019, 12:46 PM   Pager [phone] If no answer or after 5 PM call [phone]

## 2019-11-27 NOTE — Consult Note (Signed)
[facility] Renal Consultation Note    Indication for Consultation:  Management of ESRD/hemodialysis; anemia, hypertension/volume and secondary hyperparathyroidism PCP: [name], MD  HPI: Elijah White is a 79 y.o. 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  Symptoms worsened in the dialysis waiting area and EMS was called. Symptoms have since resolved.  Last dialysis was Tuesday.  K upon presentation was 4.1 yesterday morning.and has increased to 6 today.  Work up in the ED is significant for neg CT angio for PE, but noted distended GB with multiple stones, renal pelves/ureter fullness possible due to enlarged prostate but no obstruction. US shows stone wedged at GB neck, incidental right hydro new  transverse colon and adrenal masses.. CXR showed pul vasc congestion, GI has seen plan for colonoscopy on Sunday.  Patient lives alone. Daughter fills pill box.  Denies issues with bowels. Makes a small amount of urine.  No dialysis problems as outpatient.  Had recent intervention. 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   Past Medical History:  Diagnosis Date  . Anemia    low iron  . Arthritis   . Cancer [facility]) ?2006   prostate  . Chronic kidney disease    Stage 4  . Colon polyps ~ 1993 and 2003   Dr [name], [facility].  08/2001 colonoscopy: tubular adenoma at cecum.    . Diabetes mellitus without complication ([facility])   . Elevated cholesterol with high triglycerides   . GERD (gastroesophageal reflux disease)   . Hypertension   . Legally blind in left eye, as defined in [place]    has pinpoint vision in right eye   Past Surgical History:  Procedure Laterality Date  . AV FISTULA PLACEMENT Left 03/20/2018   Procedure: ARTERIOVENOUS (AV) FISTULA CREATION ARM;  Surgeon: [name], MD;  Location: [place];  Service: Vascular;   Laterality: Left;  . BASCILIC VEIN TRANSPOSITION Left 05/21/2018   Procedure: LEFT BASILIC VEIN FISTULA SECOND STAGE;  Surgeon: [name], MD;  Location: [place];  Service: Vascular;  Laterality: Left;  . COLONOSCOPY    . GLAUCOMA SURGERY  2019   multiple surgeries  . KNEE ARTHROSCOPY    . PROSTATE SURGERY     Family History  Problem Relation Age of Onset  . CAD Mother    Social History:  reports that he quit smoking about 20 months ago. He smoked 0.50 packs per day. He has never used smokeless tobacco. He reports that he does not drink alcohol and does not use drugs. No Known Allergies Prior to Admission medications   Medication Sig Start Date End Date Taking? Authorizing Provider  acetaminophen (TYLENOL) 500 MG tablet Take 2 tablets (1,000 mg total) by mouth every 6 (six) hours as needed for mild pain, moderate pain or headache. 11/20/19  Yes Burky, Lanelle Bal B, NP  apraclonidine (IOPIDINE) 0.5 % ophthalmic solution Place 1 drop into the right eye 3 (three) times daily.  08/04/19  Yes [provider]  atorvastatin (LIPITOR) 10 MG tablet TAKE 1 TABLET BY MOUTH EVERY DAY Patient taking differently: Take 10 mg by mouth daily.  11/23/19  Yes [name], MD  AURYXIA 1 GM 210 MG(Fe) tablet Take 210 mg by mouth 3 (three) times daily with meals.  07/20/19  Yes [provider]  B Complex-C-Folic Acid (DIALYVITE 309)  0.8 MG TABS Take 1 tablet by mouth daily. 05/08/19  Yes [provider]  carvedilol (COREG) 3.125 MG tablet TAKE 1 TABLET(3.125 MG) BY MOUTH TWICE DAILY WITH A MEAL Patient taking differently: Take 3.125 mg by mouth 2 (two) times daily with a meal.  11/02/19  Yes [name], MD  diclofenac Sodium (VOLTAREN) 1 % GEL Apply 4 g topically 4 (four) times daily as needed. Patient taking differently: Apply 4 g topically 4 (four) times daily as needed (pain).  11/20/19  Yes Burky, Lanelle Bal B, NP  dorzolamide-timolol (COSOPT) 22.3-6.8 MG/ML ophthalmic  solution Place 1 drop into the right eye 2 (two) times daily. 06/25/17  Yes [provider]  esomeprazole (NEXIUM) 40 MG capsule TAKE 1 CAPSULE BY MOUTH EVERY DAY 06/14/19  Yes [name], MD  gabapentin (NEURONTIN) 100 MG capsule TAKE 1 CAPSULE(100 MG) BY MOUTH AT BEDTIME Patient taking differently: Take 100 mg by mouth at bedtime.  10/12/19  Yes Minette Brine, FNP  hydrALAZINE (APRESOLINE) 25 MG tablet TAKE 1 TABLET(25 MG) BY MOUTH TWICE DAILY Patient taking differently: Take 25 mg by mouth in the morning and at bedtime.  06/25/19  Yes [name], MD  hydroxypropyl methylcellulose / hypromellose (ISOPTO TEARS / GONIOVISC) 2.5 % ophthalmic solution Place 1 drop into the left eye as needed for dry eyes.   Yes [provider]  isosorbide mononitrate (IMDUR) 30 MG 24 hr tablet TAKE 1 TABLET(30 MG) BY MOUTH DAILY Patient taking differently: Take 30 mg by mouth daily.  11/02/19  Yes [name], MD  latanoprost (XALATAN) 0.005 % ophthalmic solution Place 1 drop into the right eye at bedtime.   Yes [provider]  pilocarpine (PILOCAR) 4 % ophthalmic solution Place 1 drop into the right eye 4 (four) times daily.    Yes [provider]  BD PEN NEEDLE NANO U/F 32G X 4 MM MISC USE SUBCUTANEOUS FOUR TIMES A DAY 03/31/18   [provider]  blood glucose meter kit and supplies KIT Dispense based on patient and insurance preference. Use up to four times daily as directed. (FOR ICD-9 250.00, 250.01). For QAC - HS accuchecks. 03/30/18   Thurnell Lose, MD  Lancets (ONETOUCH DELICA PLUS [id number]) [place] USE AS DIRECTED UP TO FOUR TIMES A DAY 03/31/18   [provider]  ONETOUCH VERIO test strip USE AS DIRECTED TO TEST FOUR TIMES A DAY 07/31/18   [name], MD   Current Facility-Administered Medications  Medication Dose Route Frequency Provider Last Rate Last Admin  . acetaminophen (TYLENOL) tablet 650 mg  650 mg Oral Q6H PRN Pahwani, Rinka R, MD   650  mg at 11/26/19 1824   Or  . acetaminophen (TYLENOL) suppository 650 mg  650 mg Rectal Q6H PRN Pahwani, Rinka R, MD      . gabapentin (NEURONTIN) capsule 100 mg  100 mg Oral QHS Opyd, Ilene Qua, MD   100 mg at 11/26/19 2056  . heparin injection 5,000 Units  5,000 Units Subcutaneous Q8H Pahwani, Rinka R, MD   5,000 Units at 11/27/19 1418  . hydrALAZINE (APRESOLINE) injection 10 mg  10 mg Intravenous Q6H PRN Pahwani, Rinka R, MD   10 mg at 11/26/19 1827  . hydrocortisone cream 1 % 1 application  1 application Topical PRN Pahwani, Rinka R, MD   1 application at 26/37/85 0000  . HYDROmorphone (DILAUDID) injection 0.5-1 mg  0.5-1 mg Intravenous Q4H PRN Pahwani, Rinka R, MD      . insulin aspart (novoLOG)  injection 0-5 Units  0-5 Units Subcutaneous QHS Pahwani, Michell Heinrich, MD   2 Units at 11/26/19 2145  . insulin aspart (novoLOG) injection 0-6 Units  0-6 Units Subcutaneous TID WC Pahwani, Rinka R, MD      . ondansetron (ZOFRAN) tablet 4 mg  4 mg Oral Q6H PRN Pahwani, Rinka R, MD       Or  . ondansetron (ZOFRAN) injection 4 mg  4 mg Intravenous Q6H PRN Pahwani, Rinka R, MD      . pantoprazole (PROTONIX) EC tablet 40 mg  40 mg Oral Daily Pahwani, Rinka R, MD   40 mg at 11/27/19 1210  . senna-docusate (Senokot-S) tablet 2 tablet  2 tablet Oral Once Ronald Lobo, MD       Labs: Basic Metabolic Panel: Recent Labs  Lab 11/26/19 1112 11/26/19 1816 11/27/19 1038  NA 140  --  139  K 4.1  --  6.0*  CL 100  --  100  CO2 29  --  24  GLUCOSE 199*  --  132*  BUN 27*  --  34*  CREATININE 6.92* 7.33* 7.96*  CALCIUM 9.8  --  9.3   Liver Function Tests: Recent Labs  Lab 11/26/19 1112 11/27/19 1038  AST 19 41  ALT 15 14  ALKPHOS 99 72  BILITOT 0.5 1.3*  PROT 7.0 6.0*  ALBUMIN 3.7 3.3*   Recent Labs  Lab 11/26/19 1112  LIPASE 29   No results for input(s): AMMONIA in the last 168 hours. CBC: Recent Labs  Lab 11/26/19 1112 11/26/19 1816 11/27/19 1038  WBC 7.6 6.2 7.0  NEUTROABS 4.0  --    --   HGB 11.7* 12.1* 12.1*  HCT 36.8* 36.9* 36.6*  MCV 104.8* 102.5* 103.1*  PLT 194 209 214   Cardiac Enzymes: No results for input(s): CKTOTAL, CKMB, CKMBINDEX, TROPONINI in the last 168 hours. CBG: Recent Labs  Lab 11/26/19 1805 11/26/19 2120 11/27/19 0615 11/27/19 1210  GLUCAP 130* 217* 101* 120*   Iron Studies: No results for input(s): IRON, TIBC, TRANSFERRIN, FERRITIN in the last 72 hours. Studies/Results: CT Head Wo Contrast  Result Date: 11/26/2019 CLINICAL DATA:  Mental status change EXAM: CT HEAD WITHOUT CONTRAST TECHNIQUE: Contiguous axial images were obtained from the base of the skull through the vertex without intravenous contrast. COMPARISON:  CT head 10/20/2017 FINDINGS: Brain: Cerebral ventricle sizes are concordant with the degree of cerebral volume loss. Patchy and confluent areas of decreased attenuation are noted throughout the deep and periventricular white matter of the cerebral hemispheres bilaterally, compatible with chronic microvascular ischemic disease. No evidence of large-territorial acute infarction. No parenchymal hemorrhage. No mass lesion. No extra-axial collection. No mass effect or midline shift. No hydrocephalus. Basilar cisterns are patent. Vascular: No hyperdense vessel or unexpected calcification. Skull: Interval increase in size of a 5.7 x 2.5 cm fluid density lesion within the subcutaneus soft tissues of the occipital scalp. The lesion has slightly thick walls and persistent slight heterogeneity within. This finding likely represents sebaceous cyst. Negative for fracture or focal lesion. Sinuses/Orbits: Paranasal sinuses and mastoid air cells are clear. Status post bilateral lens replacement. Redemonstration of left glaucoma drainage device. Similar-appearing 1.2 cm fat density lesion lateral to the right orbit anterior to the zygomatic bone that may be surgical versus a benign finding (4:12). Otherwise the orbits are unremarkable. Other: Atherosclerotic  calcifications are present within the cavernous internal carotid arteries and bilateral vertebral arteries. IMPRESSION: No acute intracranial abnormality. Electronically Signed   By: Thomasena Edis  Mckinley Jewel M.D.   On: 11/26/2019 15:34   DG Chest Port 1 View  Result Date: 11/26/2019 CLINICAL DATA:  Chest pain. EXAM: PORTABLE CHEST 1 VIEW COMPARISON:  03/25/2018 FINDINGS: Mild enlargement of the cardiac silhouette, similar to prior. Pulmonary vascular congestion. No evidence of pulmonary edema. No consolidation. No visible pleural effusions. No pneumothorax. The visualized skeletal structures are unremarkable. IMPRESSION: 1. Pulmonary vascular congestion without evidence of pulmonary edema. 2. Similar mild cardiomegaly. Electronically Signed   By: Margaretha Sheffield MD   On: 11/26/2019 11:50   CT Angio Chest/Abd/Pel for Dissection W and/or W/WO  Result Date: 11/26/2019 CLINICAL DATA:  Chest and back pain EXAM: CT ANGIOGRAPHY CHEST, ABDOMEN AND PELVIS TECHNIQUE: Non-contrast CT of the chest was initially obtained. Multidetector CT imaging through the chest, abdomen and pelvis was performed using the standard protocol during bolus administration of intravenous contrast. Multiplanar reconstructed images and MIPs were obtained and reviewed to evaluate the vascular anatomy. CONTRAST:  153m OMNIPAQUE IOHEXOL 350 MG/ML SOLN COMPARISON:  11/26/2019 FINDINGS: CTA CHEST FINDINGS Cardiovascular: The thoracic aorta is unremarkable without aneurysm or dissection. Moderate atherosclerosis of the aortic arch and at the origin of the great vessels. The heart is unremarkable without pericardial effusion. Calcification of the mitral and aortic valves are noted. Moderate atherosclerosis of the coronary vessels. While not optimized for opacification of the pulmonary vasculature, there is sufficient contrast enhancement to exclude pulmonary emboli. Mediastinum/Nodes: No enlarged mediastinal, hilar, or axillary lymph nodes. Thyroid gland,  trachea, and esophagus demonstrate no significant findings. Lungs/Pleura: No acute airspace disease, effusion, or pneumothorax. The central airways are patent. Musculoskeletal: No acute or destructive bony lesions. Reconstructed images demonstrate no additional findings. Review of the MIP images confirms the above findings. CTA ABDOMEN AND PELVIS FINDINGS VASCULAR Aorta: Normal caliber aorta without aneurysm, dissection, vasculitis or significant stenosis. Celiac: Patent without evidence of aneurysm, dissection, vasculitis or significant stenosis. SMA: Patent without evidence of aneurysm, dissection, vasculitis or significant stenosis. Renals: There is severe atherosclerosis of the bilateral renal arteries, left greater than right. Multifocal high-grade stenosis at the origin of the left renal artery. IMA: Patent without evidence of aneurysm, dissection, vasculitis or significant stenosis. Inflow: Extensive atherosclerosis without high-grade stenosis. Veins: No obvious venous abnormality within the limitations of this arterial phase study. Review of the MIP images confirms the above findings. NON-VASCULAR Hepatobiliary: The gallbladder is markedly distended with numerous small calcified gallstones layering dependently. No gallbladder wall thickening or surrounding inflammation to suggest cholecystitis. The liver is unremarkable. No biliary dilation. Pancreas: Unremarkable. No pancreatic ductal dilatation or surrounding inflammatory changes. Spleen: Normal in size without focal abnormality. Adrenals/Urinary Tract: 2.7 cm indeterminate left adrenal mass. Right adrenals wrist unremarkable. There is bilateral renal cortical thinning and scarring. Otherwise normal enhancement. There is distension of the bilateral renal pelves and bilateral ureters, without clear cause for obstruction. This could be sequela of chronic bladder outlet obstruction. No filling defects within the urinary bladder. Stomach/Bowel: No bowel  obstruction or ileus. There is an enhancing 2.3 cm mass within the lumen of the mid transverse colon, reference axial image 97 and coronal image 36, concerning for neoplasm. Colonoscopy recommended. The bladder is unremarkable. No bowel wall thickening or inflammatory change. Lymphatic: No pathologic adenopathy. Reproductive: Radiotherapy seeds are seen within the prostate. Prostate is mildly enlarged. Other: No free fluid or free gas. No abdominal wall hernia. Musculoskeletal: No acute or destructive bony lesions. Reconstructed images demonstrate no additional findings. Review of the MIP images confirms the above findings. IMPRESSION: 1. No  evidence of thoracic or abdominal aortic aneurysm or dissection. No evidence of pulmonary embolus. 2. A 2.3 cm enhancing mass within the lumen of the mid transverse colon, concerning for neoplasm. Colonoscopy recommended for further evaluation. 3. Markedly distended gallbladder with numerous small calcified gallstones layering dependently. No evidence of cholecystitis. 4. 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 If further evaluation is warranted, nonemergent MRI could be performed. 5. Fullness of the bilateral renal pelves and ureters, without clear cause for obstruction. This could be sequela of chronic bladder outlet obstruction given the enlarged prostate. 6. Aortic Atherosclerosis (ICD10-I70.0). Electronically Signed   By: Randa Ngo M.D.   On: 11/26/2019 15:34   US Abdomen Limited RUQ  Result Date: 11/26/2019 CLINICAL DATA:  Abdominal pain EXAM: ULTRASOUND ABDOMEN LIMITED RIGHT UPPER QUADRANT COMPARISON:  None. FINDINGS: Gallbladder: The gallbladder is filled with gallbladder sludge and multiple gallstones. The gallbladder is distended. There is a stone that appears to be wedged at the gallbladder neck. There is no gallbladder wall thickening. The sonographic Percell Miller sign is reported as negative. Common bile duct:  Diameter: 3 mm Liver: There is a heterogeneous appearance of the liver parenchyma without evidence for a discrete hepatic mass. Portal vein is patent on color Doppler imaging with normal direction of blood flow towards the liver. Other: Incidentally noted is right-sided hydronephrosis. IMPRESSION: 1. Cholelithiasis without definite CT evidence for acute cholecystitis. However, the gallbladder is distended with a stone that appears to be wedged at the gallbladder neck. 2. Incidentally noted right-sided hydronephrosis. Electronically Signed   By: Constance Holster M.D.   On: 11/26/2019 17:39    ROS: As per HPI otherwise negative.  Physical Exam: Vitals:   11/27/19 0020 11/27/19 0453 11/27/19 0821 11/27/19 1215  BP: (!) 153/76 121/61 (!) 145/76 (!) 149/73  Pulse: 83 83 85 85  Resp: 18 18 20 20   Temp: 98.5 F (36.9 C) 98.9 F (37.2 C) 98.1 F (36.7 C) 98 F (36.7 C)  TempSrc: Oral Oral Oral Oral  SpO2: 100% 99% 97% 100%  Weight:  75.5 kg    Height:         General: WDWN NAD Head: NCAT  MMM left eye blind keeps eyes closed Neck: Supple.  Lungs: CTA bilaterally without wheezes, rales, or rhonchi. Breathing is unlabored. Heart: RRR with S1 S2.  Abdomen: soft NT + BS Lower extremities:without edema or ischemic changes, no open wounds  Neuro: A & O  X 3. Moves all extremities spontaneously. Psych:  Responds to questions appropriately with a normal affect. Dialysis Access:left upper AVF + bruit  Dialysis Orders:  TTS [place] 4 hr EDW 76 2 K 2 Ca 400/800 left upper AVF  no heparin no Fe no ESA hectorol 7 Recent labs: hgb 12 iPTH 300s Ca/P controlled - 2 Aurixya ac; very compliant with treatemtns   Assessment/Plan: 1. Chest pain/epigastric pain- work up in progress; incidental finding of colonic and adrenal mass.  For colonoscopy Sunday - prep tomorrow after dialysis.  Sx don't  sound cardiac Trop 36>37>41, PPI started 2. Hyperkalemia - Not clear how K increased from 4.1 > 6 in 24 hours while  in the ED; very odd for him K usually in the 4s pre HD- plan one dose lokelma and dialyze first round Saturday  3. ESRD -  TTS - - missed Thursday - will run him first shift Saturday 4. Hypertension/volume  - outpatient BP stable - getting below EDW 0.4 may need to go  down a little more  5. Anemia  - hgb 12.1 stable - no ESA/ IV Fe -  6. 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 Nutrition - renal carb mod after GI eval - CL for now resume vits post procedure   Myriam Jacobson, PA-C [place] [phone] 11/27/2019, 3:29 PM

## 2019-11-27 NOTE — Progress Notes (Addendum)
Received call from pt's daughter. Med rec was completed earlier today. Daughter would like glaucoma eye drops to be added to hospital orders from PTA med list.    Latanoprost and Pilocarpine ordered and given.   Apraclonidine ordered, but pharmacy does not carry this med. Pharmacy has instructed that a family member would have to supply this med from home.  Cosopt yet to be ordered.

## 2019-11-27 NOTE — Progress Notes (Signed)
PROGRESS NOTE  Elijah White IOE:[id number] DOB: 07-04-1940 DOA: 11/26/2019 PCP: [name], MD   LOS: 1 day   Brief narrative: As per HPI,  Elijah White is a 79 y.o. male with medical history significant of ESRD on dialysis (TTS), hypertension, hyperlipidemia, diabetes mellitus, GERD, legally blind in left eye, chronic diastolic CHF presented to the emergency department for evaluation of chest pain/epigastric pain, shortness of breath and generalized weakness for 1 day.  Patient's daughter was driving him to dialysis unit when he developed chest pain so he was brought into the hospital he also had shortness of breath.  Patient had also lost 10 to 12 pounds in last 90-month since he has been started on dialysis.  Patient daughter stated that he lives alone at home and he is independent on daily life activities.  She is concerned as patient is legally blind from left eye and head recent fall with bruises on his arm and is concerned about safety at home.  Upon arrival to ED: Patient's vital signs stable, afebrile, no leukocytosis, maintaining oxygen saturation on room air, initial troponin 36 trended up to 37, EKG: No acute changes, COVID-19 negative.  CT head negative for acute findings, lipase: WNL.  CTA chest: Negative for PE or aortic dissection.  CT was concerning for 2.3 cm in size transverse colon mass, 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.  Chest x-ray shows pulmonary vascular congestion without pulmonary edema, mild cardiomegaly.  EDP consulted nephrology.  [facility] consulted for admission for chest pain rule out ACS.   Assessment/Plan:  Principal Problem:   Chest pain Active Problems:   Type 2 diabetes mellitus with renal manifestations (HCC)   Essential hypertension   GERD (gastroesophageal reflux disease)   HLD (hyperlipidemia)   ESRD (end stage renal disease) (HCC)   Macrocytic anemia   Adrenal mass (HCC)   Colonic mass  Chest pain/epigastric pain & SOB: Chest  x-ray showed some pulmonary vascular congestion, mild cardiomegaly. Patient will need hemodialysis.  Chest pain appears to be atypical mostly epigastric in nature.  There is a possibility of biliary colic.  Ultrasound shows possible stone in the gallbladder.  Patient is not inclined for surgical intervention and symptomatic treatment is likely to be pursued.  Continue PPI.  Troponin borderline elevated but in the context of ESRD.  EKG was unremarkable.  Cholelithiasis without cholecystitis.  Possibility of biliary colic.  If surgical intervention is risky or not wanted possible trial of ursedeoxycholic acid.   ESRD on hemodialysis: Tuesday Thursday Saturday schedule.  Nephrology on board for hemodialysis.  Transverse colon mass: 2.3 cm in size.  Incidental finding. -Spoke with GI for consultation.  Patient does have mild epigastric discomfort decreased appetite weight loss.  Patient did have colonoscopy in 2009 and polyps were removed.  Spoke with Dr. Annette Stable GI.  Hypertension: Stable Continue Coreg, hydralazine, Imdur.  Continue to monitor blood pressure closely. -Hydralazine as needed for blood pressure more than 160/100.  Diabetes mellitus: Continue sliding scale insulin, Accu-Cheks, diabetic diet.   GERD: Continue PPI  Diabetic neuropathy: Continue gabapentin  Chronic diastolic CHF: Continue Imdur, statin, Coreg. 2D echocardiogram from 09/02/2017 which showed ejection fraction of 65 to 70% and grade 1 diastolic dysfunction.  Continue intake and output charting Daily weights.  Hyperlipidemia: Continue statin  Macrocytic anemia: TSH of 0.8.  Vitamin B-12 level elevated.  Folate level within normal limits.  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  Incidental finding.  GI has been consulted for colonic mass.  Distended gallbladder with small gallstones: No evidence of cholecystitis on CT scan of the chest.  Right upper quadrant ultrasound shows cholelithiasis without acute evidence for  cholecystitis.  There is possibility of stone at the gallbladder neck.  Right-sided hydronephrosis was also noted.  Lipase within normal range.  Debility, deconditioning, legal blindness.  Patient lives alone.  Safety concern due to recent fall at home.  Patient's daughter wishes skilled nursing facility on discharge.    DVT prophylaxis: heparin injection 5,000 Units Start: 11/26/19 1600 SCDs Start: 11/26/19 1554    Code Status: DNR  Family Communication: Spoke with the patient's daughter at length at bedside.  Status is: Inpatient  Remains inpatient appropriate because:Inpatient level of care appropriate due to severity of illness and Colonoscopic evaluation.   Dispo: The patient is from: Home              Anticipated d/c is to: SNF              Anticipated d/c date is: 2 days              Patient currently is not medically stable to d/c.   Consultants:  GI  Nephrology  Procedures:  Hemodialysis  Antibiotics:  . None  Anti-infectives (From admission, onward)   None       Subjective: Today, patient was seen and examined at bedside.  Patient denies abdominal pain at this time.  Subjective dyspnea.  Objective: Vitals:   11/27/19 0453 11/27/19 0821  BP: 121/61 (!) 145/76  Pulse: 83 85  Resp: 18 20  Temp: 98.9 F (37.2 C) 98.1 F (36.7 C)  SpO2: 99% 97%    Intake/Output Summary (Last 24 hours) at 11/27/2019 0956 Last data filed at 11/27/2019 0900 Gross per 24 hour  Intake 240 ml  Output --  Net 240 ml   Filed Weights   11/26/19 1759 11/27/19 0453  Weight: 76.2 kg 75.5 kg   Body mass index is 32.51 kg/m.   Physical Exam: GENERAL: Patient is alert awake and communicative. Not in obvious distress. HENT: Conjunctival redness.  Pupils equally reactive to light. Oral mucosa is moist. Legally blind on the left eye. NECK: is supple, no gross swelling noted. CHEST: Clear to auscultation. No crackles or wheezes.  Diminished breath sounds  bilaterally. CVS: S1 and S2 heard, systolic murmur noted.  Regular rate and rhythm.  ABDOMEN: Soft, non-tender, bowel sounds are present. EXTREMITIES: No edema.  Left upper extremity hemodialysis access.  Right upper extremity with bruise. CNS: Cranial nerves are intact. No focal motor deficits. SKIN: warm and dry without rashes.  Right upper extremity bruise  Data Review: I have personally reviewed the following laboratory data and studies,  CBC: Recent Labs  Lab 11/26/19 1112 11/26/19 1816  WBC 7.6 6.2  NEUTROABS 4.0  --   HGB 11.7* 12.1*  HCT 36.8* 36.9*  MCV 104.8* 102.5*  PLT 194 924   Basic Metabolic Panel: Recent Labs  Lab 11/26/19 1112 11/26/19 1816  NA 140  --   K 4.1  --   CL 100  --   CO2 29  --   GLUCOSE 199*  --   BUN 27*  --   CREATININE 6.92* 7.33*  CALCIUM 9.8  --   MG  --  2.4   Liver Function Tests: Recent Labs  Lab 11/26/19 1112  AST 19  ALT 15  ALKPHOS 99  BILITOT 0.5  PROT 7.0  ALBUMIN 3.7   Recent Labs  Lab 11/26/19  1112  LIPASE 29   No results for input(s): AMMONIA in the last 168 hours. Cardiac Enzymes: No results for input(s): CKTOTAL, CKMB, CKMBINDEX, TROPONINI in the last 168 hours. BNP (last 3 results) No results for input(s): BNP in the last 8760 hours.  ProBNP (last 3 results) No results for input(s): PROBNP in the last 8760 hours.  CBG: Recent Labs  Lab 11/26/19 1805 11/26/19 2120 11/27/19 0615  GLUCAP 130* 217* 101*   Recent Results (from the past 240 hour(s))  Respiratory Panel by RT PCR (Flu A&B, Covid) - Nasopharyngeal Swab     Status: None   Collection Time: 11/26/19 11:12 AM   Specimen: Nasopharyngeal Swab  Result Value Ref Range Status   SARS Coronavirus 2 by RT PCR NEGATIVE NEGATIVE Final    Comment: (NOTE) SARS-CoV-2 target nucleic acids are NOT DETECTED.  The SARS-CoV-2 RNA is generally detectable in upper respiratoy specimens during the acute phase of infection. The lowest concentration of  SARS-CoV-2 viral copies this assay can detect is 131 copies/mL. A negative result does not preclude SARS-Cov-2 infection and should not be used as the sole basis for treatment or other patient management decisions. A negative result may occur with  improper specimen collection/handling, submission of specimen other than nasopharyngeal swab, presence of viral mutation(s) within the areas targeted by this assay, and inadequate number of viral copies (<131 copies/mL). A negative result must be combined with clinical observations, patient history, and epidemiological information. The expected result is Negative.  Fact Sheet for Patients:  PinkCheek.be  Fact Sheet for Healthcare Providers:  GravelBags.it  This test is no t yet approved or cleared by the [place] FDA and  has been authorized for detection and/or diagnosis of SARS-CoV-2 by FDA under an Emergency Use Authorization (EUA). This EUA will remain  in effect (meaning this test can be used) for the duration of the COVID-19 declaration under Section 564(b)(1) of the Act, 21 U.S.C. section 360bbb-3(b)(1), unless the authorization is terminated or revoked sooner.     Influenza A by PCR NEGATIVE NEGATIVE Final   Influenza B by PCR NEGATIVE NEGATIVE Final    Comment: (NOTE) The Xpert Xpress SARS-CoV-2/FLU/RSV assay is intended as an aid in  the diagnosis of influenza from Nasopharyngeal swab specimens and  should not be used as a sole basis for treatment. Nasal washings and  aspirates are unacceptable for Xpert Xpress SARS-CoV-2/FLU/RSV  testing.  Fact Sheet for Patients: PinkCheek.be  Fact Sheet for Healthcare Providers: GravelBags.it  This test is not yet approved or cleared by the [place] FDA and  has been authorized for detection and/or diagnosis of SARS-CoV-2 by  FDA under an Emergency Use  Authorization (EUA). This EUA will remain  in effect (meaning this test can be used) for the duration of the  Covid-19 declaration under Section 564(b)(1) of the Act, 21  U.S.C. section 360bbb-3(b)(1), unless the authorization is  terminated or revoked. Performed at [facility], [place] [address], [place], [place] [address]   MRSA PCR Screening     Status: None   Collection Time: 11/26/19  9:49 PM   Specimen: Nasal Mucosa; Nasopharyngeal  Result Value Ref Range Status   MRSA by PCR NEGATIVE NEGATIVE Final    Comment:        The GeneXpert MRSA Assay (FDA approved for NASAL specimens only), is one component of a comprehensive MRSA colonization surveillance program. It is not intended to diagnose MRSA infection nor to guide or monitor treatment for MRSA  infections. Performed at [facility], [place] [address] [place], [place] [address]      Studies: CT Head Wo Contrast  Result Date: 11/26/2019 CLINICAL DATA:  Mental status change EXAM: CT HEAD WITHOUT CONTRAST TECHNIQUE: Contiguous axial images were obtained from the base of the skull through the vertex without intravenous contrast. COMPARISON:  CT head 10/20/2017 FINDINGS: Brain: Cerebral ventricle sizes are concordant with the degree of cerebral volume loss. Patchy and confluent areas of decreased attenuation are noted throughout the deep and periventricular white matter of the cerebral hemispheres bilaterally, compatible with chronic microvascular ischemic disease. No evidence of large-territorial acute infarction. No parenchymal hemorrhage. No mass lesion. No extra-axial collection. No mass effect or midline shift. No hydrocephalus. Basilar cisterns are patent. Vascular: No hyperdense vessel or unexpected calcification. Skull: Interval increase in size of a 5.7 x 2.5 cm fluid density lesion within the subcutaneus soft tissues of the occipital scalp. The lesion has slightly thick walls and persistent slight heterogeneity within.  This finding likely represents sebaceous cyst. Negative for fracture or focal lesion. Sinuses/Orbits: Paranasal sinuses and mastoid air cells are clear. Status post bilateral lens replacement. Redemonstration of left glaucoma drainage device. Similar-appearing 1.2 cm fat density lesion lateral to the right orbit anterior to the zygomatic bone that may be surgical versus a benign finding (4:12). Otherwise the orbits are unremarkable. Other: Atherosclerotic calcifications are present within the cavernous internal carotid arteries and bilateral vertebral arteries. IMPRESSION: No acute intracranial abnormality. Electronically Signed   By: Iven Finn M.D.   On: 11/26/2019 15:34   DG Chest Port 1 View  Result Date: 11/26/2019 CLINICAL DATA:  Chest pain. EXAM: PORTABLE CHEST 1 VIEW COMPARISON:  03/25/2018 FINDINGS: Mild enlargement of the cardiac silhouette, similar to prior. Pulmonary vascular congestion. No evidence of pulmonary edema. No consolidation. No visible pleural effusions. No pneumothorax. The visualized skeletal structures are unremarkable. IMPRESSION: 1. Pulmonary vascular congestion without evidence of pulmonary edema. 2. Similar mild cardiomegaly. Electronically Signed   By: Margaretha Sheffield MD   On: 11/26/2019 11:50   CT Angio Chest/Abd/Pel for Dissection W and/or W/WO  Result Date: 11/26/2019 CLINICAL DATA:  Chest and back pain EXAM: CT ANGIOGRAPHY CHEST, ABDOMEN AND PELVIS TECHNIQUE: Non-contrast CT of the chest was initially obtained. Multidetector CT imaging through the chest, abdomen and pelvis was performed using the standard protocol during bolus administration of intravenous contrast. Multiplanar reconstructed images and MIPs were obtained and reviewed to evaluate the vascular anatomy. CONTRAST:  143mL OMNIPAQUE IOHEXOL 350 MG/ML SOLN COMPARISON:  11/26/2019 FINDINGS: CTA CHEST FINDINGS Cardiovascular: The thoracic aorta is unremarkable without aneurysm or dissection. Moderate  atherosclerosis of the aortic arch and at the origin of the great vessels. The heart is unremarkable without pericardial effusion. Calcification of the mitral and aortic valves are noted. Moderate atherosclerosis of the coronary vessels. While not optimized for opacification of the pulmonary vasculature, there is sufficient contrast enhancement to exclude pulmonary emboli. Mediastinum/Nodes: No enlarged mediastinal, hilar, or axillary lymph nodes. Thyroid gland, trachea, and esophagus demonstrate no significant findings. Lungs/Pleura: No acute airspace disease, effusion, or pneumothorax. The central airways are patent. Musculoskeletal: No acute or destructive bony lesions. Reconstructed images demonstrate no additional findings. Review of the MIP images confirms the above findings. CTA ABDOMEN AND PELVIS FINDINGS VASCULAR Aorta: Normal caliber aorta without aneurysm, dissection, vasculitis or significant stenosis. Celiac: Patent without evidence of aneurysm, dissection, vasculitis or significant stenosis. SMA: Patent without evidence of aneurysm, dissection, vasculitis or significant stenosis. Renals: There is severe  atherosclerosis of the bilateral renal arteries, left greater than right. Multifocal high-grade stenosis at the origin of the left renal artery. IMA: Patent without evidence of aneurysm, dissection, vasculitis or significant stenosis. Inflow: Extensive atherosclerosis without high-grade stenosis. Veins: No obvious venous abnormality within the limitations of this arterial phase study. Review of the MIP images confirms the above findings. NON-VASCULAR Hepatobiliary: The gallbladder is markedly distended with numerous small calcified gallstones layering dependently. No gallbladder wall thickening or surrounding inflammation to suggest cholecystitis. The liver is unremarkable. No biliary dilation. Pancreas: Unremarkable. No pancreatic ductal dilatation or surrounding inflammatory changes. Spleen: Normal in  size without focal abnormality. Adrenals/Urinary Tract: 2.7 cm indeterminate left adrenal mass. Right adrenals wrist unremarkable. There is bilateral renal cortical thinning and scarring. Otherwise normal enhancement. There is distension of the bilateral renal pelves and bilateral ureters, without clear cause for obstruction. This could be sequela of chronic bladder outlet obstruction. No filling defects within the urinary bladder. Stomach/Bowel: No bowel obstruction or ileus. There is an enhancing 2.3 cm mass within the lumen of the mid transverse colon, reference axial image 97 and coronal image 36, concerning for neoplasm. Colonoscopy recommended. The bladder is unremarkable. No bowel wall thickening or inflammatory change. Lymphatic: No pathologic adenopathy. Reproductive: Radiotherapy seeds are seen within the prostate. Prostate is mildly enlarged. Other: No free fluid or free gas. No abdominal wall hernia. Musculoskeletal: No acute or destructive bony lesions. Reconstructed images demonstrate no additional findings. Review of the MIP images confirms the above findings. IMPRESSION: 1. No evidence of thoracic or abdominal aortic aneurysm or dissection. No evidence of pulmonary embolus. 2. A 2.3 cm enhancing mass within the lumen of the mid transverse colon, concerning for neoplasm. Colonoscopy recommended for further evaluation. 3. Markedly distended gallbladder with numerous small calcified gallstones layering dependently. No evidence of cholecystitis. 4. 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 If further evaluation is warranted, nonemergent MRI could be performed. 5. Fullness of the bilateral renal pelves and ureters, without clear cause for obstruction. This could be sequela of chronic bladder outlet obstruction given the enlarged prostate. 6. Aortic Atherosclerosis (ICD10-I70.0). Electronically Signed   By: Randa Ngo M.D.   On: 11/26/2019 15:34   US  Abdomen Limited RUQ  Result Date: 11/26/2019 CLINICAL DATA:  Abdominal pain EXAM: ULTRASOUND ABDOMEN LIMITED RIGHT UPPER QUADRANT COMPARISON:  None. FINDINGS: Gallbladder: The gallbladder is filled with gallbladder sludge and multiple gallstones. The gallbladder is distended. There is a stone that appears to be wedged at the gallbladder neck. There is no gallbladder wall thickening. The sonographic Percell Miller sign is reported as negative. Common bile duct: Diameter: 3 mm Liver: There is a heterogeneous appearance of the liver parenchyma without evidence for a discrete hepatic mass. Portal vein is patent on color Doppler imaging with normal direction of blood flow towards the liver. Other: Incidentally noted is right-sided hydronephrosis. IMPRESSION: 1. Cholelithiasis without definite CT evidence for acute cholecystitis. However, the gallbladder is distended with a stone that appears to be wedged at the gallbladder neck. 2. Incidentally noted right-sided hydronephrosis. Electronically Signed   By: Constance Holster M.D.   On: 11/26/2019 17:39      Flora Lipps, MD  [facility] 11/27/2019

## 2019-11-27 NOTE — Progress Notes (Signed)
Education given on Heparin SQ importance. Pt verbalizes understanding. RN gave heparin SQ with pt's permission.

## 2019-11-28 ENCOUNTER — Inpatient Hospital Stay (HOSPITAL_COMMUNITY): Payer: Medicare Other

## 2019-11-28 DIAGNOSIS — R079 Chest pain, unspecified: Secondary | ICD-10-CM

## 2019-11-28 DIAGNOSIS — I208 Other forms of angina pectoris: Secondary | ICD-10-CM

## 2019-11-28 DIAGNOSIS — I350 Nonrheumatic aortic (valve) stenosis: Secondary | ICD-10-CM

## 2019-11-28 DIAGNOSIS — I10 Essential (primary) hypertension: Secondary | ICD-10-CM

## 2019-11-28 LAB — BASIC METABOLIC PANEL
Anion gap: 14 (ref 5–15)
BUN: 41 mg/dL — ABNORMAL HIGH (ref 8–23)
CO2: 25 mmol/L (ref 22–32)
Calcium: 9.5 mg/dL (ref 8.9–10.3)
Chloride: 100 mmol/L (ref 98–111)
Creatinine, Ser: 9.36 mg/dL — ABNORMAL HIGH (ref 0.61–1.24)
GFR calc Af Amer: 5 mL/min — ABNORMAL LOW (ref >60)
GFR calc non Af Amer: 5 mL/min — ABNORMAL LOW (ref >60)
Glucose, Bld: 126 mg/dL — ABNORMAL HIGH (ref 70–99)
Potassium: 4.1 mmol/L (ref 3.5–5.1)
Sodium: 139 mmol/L (ref 135–145)

## 2019-11-28 LAB — GLUCOSE, CAPILLARY
Glucose-Capillary: 119 mg/dL — ABNORMAL HIGH (ref 70–99)
Glucose-Capillary: 112 mg/dL — ABNORMAL HIGH (ref 70–99)
Glucose-Capillary: 197 mg/dL — ABNORMAL HIGH (ref 70–99)
Glucose-Capillary: 244 mg/dL — ABNORMAL HIGH (ref 70–99)

## 2019-11-28 LAB — CBC
HCT: 35 % — ABNORMAL LOW (ref 39.0–52.0)
Hemoglobin: 11.3 g/dL — ABNORMAL LOW (ref 13.0–17.0)
MCH: 32.8 pg (ref 26.0–34.0)
MCHC: 32.3 g/dL (ref 30.0–36.0)
MCV: 101.4 fL — ABNORMAL HIGH (ref 80.0–100.0)
Platelets: 208 10*3/uL (ref 150–400)
RBC: 3.45 MIL/uL — ABNORMAL LOW (ref 4.22–5.81)
RDW: 13 % (ref 11.5–15.5)
WBC: 6.3 10*3/uL (ref 4.0–10.5)
nRBC: 0 % (ref 0.0–0.2)

## 2019-11-28 LAB — MAGNESIUM: Magnesium: 2.3 mg/dL (ref 1.7–2.4)

## 2019-11-28 MED ORDER — DOXERCALCIFEROL 4 MCG/2ML IV SOLN
INTRAVENOUS | Status: AC
  Filled 2019-11-28: qty 4

## 2019-11-28 MED ORDER — LIDOCAINE HCL (PF) 1 % IJ SOLN: 5.0000 mL | INTRAMUSCULAR | Status: DC | PRN

## 2019-11-28 MED ORDER — SODIUM CHLORIDE 0.9 % IV SOLN: 100.0000 mL | INTRAVENOUS | Status: DC | PRN

## 2019-11-28 MED ORDER — HEPARIN SODIUM (PORCINE) 1000 UNIT/ML DIALYSIS: 1000.0000 [IU] | INTRAMUSCULAR | Status: DC | PRN

## 2019-11-28 MED ORDER — LIDOCAINE-PRILOCAINE 2.5-2.5 % EX CREA: 1.0000 "application " | TOPICAL_CREAM | CUTANEOUS | Status: DC | PRN

## 2019-11-28 MED ORDER — ALTEPLASE 2 MG IJ SOLR: 2.0000 mg | Freq: Once | INTRAMUSCULAR | Status: DC | PRN

## 2019-11-28 MED ORDER — PENTAFLUOROPROP-TETRAFLUOROETH EX AERO: 1.0000 "application " | INHALATION_SPRAY | CUTANEOUS | Status: DC | PRN

## 2019-11-28 NOTE — Progress Notes (Signed)
[facility] Progress Note   Dialysis Orders: TTS [place] 4 hr EDW 76 2 K 2 Ca 400/800 left upper AVF  no heparin no Fe no ESA hectorol 7 Recent labs: hgb 12 iPTH 300s Ca/P controlled - 2 Aurixya ac; very compliant with treatemtns   Assessment/Plan: 1. 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  For colonoscopy Sunday - prep today after dialysis.  Sx don't  sound cardiac Trop 36>37>41, PPI started I in total bil noted US showed GB distension with stone that appeared to be wedged at GB neck no acute cholecystitis. 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 Hyperkalemia - Not clear how K increased from 4.1 > 6 > 4.1 Given lokelma yesterday but not likely to have dropped K that much suspect 6 was false high 3. ESRD -  TTS - - missed Thursday - back on schedule today  4. Hypertension/volume  - outpatient BP stable -lowering volume - new EDW at d/c 5. Anemia  - hgb 12.1 stable - no ESA/ IV Fe -  6. 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 Nutrition - renal carb mod after GI eval - CL for now resume vits post procedure   Myriam Jacobson, PA-C [place] 11/28/2019,7:42 AM  LOS: 2 days   Subjective:   Seen on HD - repeat labs this am K back to 4.1 - suspect K of 6 false high. Pre HD weight below edw - set net UF goal for 2 L -No more epigastric pain.  Hungry - explained only liquids until after colonoscopy.  Objective Vitals:   11/28/19 0431 11/28/19 0652 11/28/19 0705 11/28/19 0710  BP: (!) 142/72  (!) 143/74 (!) 154/79  Pulse: 77  84 76  Resp: 18  20 19   Temp: 98.4 F (36.9 C)  98.7 F (37.1 C)   TempSrc: Oral  Oral   SpO2: 98%  98%   Weight: 76.2 kg 75.3  kg 75.2 kg   Height:       Physical Exam General: supine on HD NAD Heart: RRR Lungs: no rales Abdomen: soft NT Extremities: no LE edema  Dialysis Access: left upper AVF Qb 400   Additional Objective Labs: Basic Metabolic Panel: Recent Labs  Lab 11/26/19 1112 11/26/19 1112 11/26/19 1816 11/27/19 1038 11/28/19 0248  NA 140  --   --  139 139  K 4.1  --   --  6.0* 4.1  CL 100  --   --  100 100  CO2 29  --   --  24 25  GLUCOSE 199*  --   --  132* 126*  BUN 27*  --   --  34* 41*  CREATININE 6.92*   < > 7.33* 7.96* 9.36*  CALCIUM 9.8  --   --  9.3 9.5   < > = values in this interval not displayed.   Liver Function Tests: Recent Labs  Lab 11/26/19 1112 11/27/19 1038  AST 19 41  ALT 15 14  ALKPHOS 99 72  BILITOT 0.5 1.3*  PROT 7.0 6.0*  ALBUMIN 3.7 3.3*   Recent Labs  Lab 11/26/19 1112  LIPASE 29   CBC: Recent Labs  Lab 11/26/19 1112 11/26/19 1112  11/26/19 1816 11/27/19 1038 11/28/19 0248  WBC 7.6   < > 6.2 7.0 6.3  NEUTROABS 4.0  --   --   --   --   HGB 11.7*   < > 12.1* 12.1* 11.3*  HCT 36.8*   < > 36.9* 36.6* 35.0*  MCV 104.8*  --  102.5* 103.1* 101.4*  PLT 194   < > 209 214 208   < > = values in this interval not displayed.   Blood Culture    Component Value Date/Time   SDES URINE, RANDOM 06/05/2007 0039   SPECREQUEST NONE 06/05/2007 0039   CULT NO GROWTH 06/05/2007 0039   REPTSTATUS 06/06/2007 FINAL 06/05/2007 0039    Cardiac Enzymes: No results for input(s): CKTOTAL, CKMB, CKMBINDEX, TROPONINI in the last 168 hours. CBG: Recent Labs  Lab 11/26/19 2120 11/27/19 0615 11/27/19 1210 11/27/19 1719 11/27/19 2147  GLUCAP 217* 101* 120* 197* 144*   Iron Studies: No results for input(s): IRON, TIBC, TRANSFERRIN, FERRITIN in the last 72 hours. Lab Results  Component Value Date   INR 1.0 11/26/2019   INR 1.11 03/25/2018   Studies/Results: CT Head Wo Contrast  Result Date: 11/26/2019 CLINICAL DATA:  Mental status change EXAM: CT HEAD  WITHOUT CONTRAST TECHNIQUE: Contiguous axial images were obtained from the base of the skull through the vertex without intravenous contrast. COMPARISON:  CT head 10/20/2017 FINDINGS: Brain: Cerebral ventricle sizes are concordant with the degree of cerebral volume loss. Patchy and confluent areas of decreased attenuation are noted throughout the deep and periventricular white matter of the cerebral hemispheres bilaterally, compatible with chronic microvascular ischemic disease. No evidence of large-territorial acute infarction. No parenchymal hemorrhage. No mass lesion. No extra-axial collection. No mass effect or midline shift. No hydrocephalus. Basilar cisterns are patent. Vascular: No hyperdense vessel or unexpected calcification. Skull: Interval increase in size of a 5.7 x 2.5 cm fluid density lesion within the subcutaneus soft tissues of the occipital scalp. The lesion has slightly thick walls and persistent slight heterogeneity within. This finding likely represents sebaceous cyst. Negative for fracture or focal lesion. Sinuses/Orbits: Paranasal sinuses and mastoid air cells are clear. Status post bilateral lens replacement. Redemonstration of left glaucoma drainage device. Similar-appearing 1.2 cm fat density lesion lateral to the right orbit anterior to the zygomatic bone that may be surgical versus a benign finding (4:12). Otherwise the orbits are unremarkable. Other: Atherosclerotic calcifications are present within the cavernous internal carotid arteries and bilateral vertebral arteries. IMPRESSION: No acute intracranial abnormality. Electronically Signed   By: Iven Finn M.D.   On: 11/26/2019 15:34   DG Chest Port 1 View  Result Date: 11/26/2019 CLINICAL DATA:  Chest pain. EXAM: PORTABLE CHEST 1 VIEW COMPARISON:  03/25/2018 FINDINGS: Mild enlargement of the cardiac silhouette, similar to prior. Pulmonary vascular congestion. No evidence of pulmonary edema. No consolidation. No visible pleural  effusions. No pneumothorax. The visualized skeletal structures are unremarkable. IMPRESSION: 1. Pulmonary vascular congestion without evidence of pulmonary edema. 2. Similar mild cardiomegaly. Electronically Signed   By: Margaretha Sheffield MD   On: 11/26/2019 11:50   CT Angio Chest/Abd/Pel for Dissection W and/or W/WO  Result Date: 11/26/2019 CLINICAL DATA:  Chest and back pain EXAM: CT ANGIOGRAPHY CHEST, ABDOMEN AND PELVIS TECHNIQUE: Non-contrast CT of the chest was initially obtained. Multidetector CT imaging through the chest, abdomen and pelvis was performed using the standard protocol during bolus administration of intravenous contrast. Multiplanar reconstructed images and MIPs were obtained and reviewed to evaluate the vascular anatomy. CONTRAST:  120mL OMNIPAQUE IOHEXOL 350 MG/ML SOLN COMPARISON:  11/26/2019 FINDINGS: CTA CHEST FINDINGS Cardiovascular: The thoracic aorta is unremarkable without aneurysm or dissection. Moderate atherosclerosis of the aortic arch and at the origin of the great vessels. The heart is unremarkable without pericardial effusion. Calcification of the mitral and aortic valves are noted. Moderate atherosclerosis of the coronary vessels. While not optimized for opacification of the pulmonary vasculature, there is sufficient contrast enhancement to exclude pulmonary emboli. Mediastinum/Nodes: No enlarged mediastinal, hilar, or axillary lymph nodes. Thyroid gland, trachea, and esophagus demonstrate no significant findings. Lungs/Pleura: No acute airspace disease, effusion, or pneumothorax. The central airways are patent. Musculoskeletal: No acute or destructive bony lesions. Reconstructed images demonstrate no additional findings. Review of the MIP images confirms the above findings. CTA ABDOMEN AND PELVIS FINDINGS VASCULAR Aorta: Normal caliber aorta without aneurysm, dissection, vasculitis or significant stenosis. Celiac: Patent without evidence of aneurysm, dissection, vasculitis or  significant stenosis. SMA: Patent without evidence of aneurysm, dissection, vasculitis or significant stenosis. Renals: There is severe atherosclerosis of the bilateral renal arteries, left greater than right. Multifocal high-grade stenosis at the origin of the left renal artery. IMA: Patent without evidence of aneurysm, dissection, vasculitis or significant stenosis. Inflow: Extensive atherosclerosis without high-grade stenosis. Veins: No obvious venous abnormality within the limitations of this arterial phase study. Review of the MIP images confirms the above findings. NON-VASCULAR Hepatobiliary: The gallbladder is markedly distended with numerous small calcified gallstones layering dependently. No gallbladder wall thickening or surrounding inflammation to suggest cholecystitis. The liver is unremarkable. No biliary dilation. Pancreas: Unremarkable. No pancreatic ductal dilatation or surrounding inflammatory changes. Spleen: Normal in size without focal abnormality. Adrenals/Urinary Tract: 2.7 cm indeterminate left adrenal mass. Right adrenals wrist unremarkable. There is bilateral renal cortical thinning and scarring. Otherwise normal enhancement. There is distension of the bilateral renal pelves and bilateral ureters, without clear cause for obstruction. This could be sequela of chronic bladder outlet obstruction. No filling defects within the urinary bladder. Stomach/Bowel: No bowel obstruction or ileus. There is an enhancing 2.3 cm mass within the lumen of the mid transverse colon, reference axial image 97 and coronal image 36, concerning for neoplasm. Colonoscopy recommended. The bladder is unremarkable. No bowel wall thickening or inflammatory change. Lymphatic: No pathologic adenopathy. Reproductive: Radiotherapy seeds are seen within the prostate. Prostate is mildly enlarged. Other: No free fluid or free gas. No abdominal wall hernia. Musculoskeletal: No acute or destructive bony lesions. Reconstructed  images demonstrate no additional findings. Review of the MIP images confirms the above findings. IMPRESSION: 1. No evidence of thoracic or abdominal aortic aneurysm or dissection. No evidence of pulmonary embolus. 2. A 2.3 cm enhancing mass within the lumen of the mid transverse colon, concerning for neoplasm. Colonoscopy recommended for further evaluation. 3. Markedly distended gallbladder with numerous small calcified gallstones layering dependently. No evidence of cholecystitis. 4. 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 If further evaluation is warranted, nonemergent MRI could be performed. 5. Fullness of the bilateral renal pelves and ureters, without clear cause for obstruction. This could be sequela of chronic bladder outlet obstruction given the enlarged prostate. 6. Aortic Atherosclerosis (ICD10-I70.0). Electronically Signed   By: Randa Ngo M.D.   On: 11/26/2019 15:34   US Abdomen Limited RUQ  Result Date: 11/26/2019 CLINICAL DATA:  Abdominal pain EXAM: ULTRASOUND ABDOMEN LIMITED RIGHT UPPER QUADRANT COMPARISON:  None. FINDINGS: Gallbladder: The gallbladder is filled with gallbladder sludge and multiple gallstones. The gallbladder is distended. There is a stone  that appears to be wedged at the gallbladder neck. There is no gallbladder wall thickening. The sonographic Percell Miller sign is reported as negative. Common bile duct: Diameter: 3 mm Liver: There is a heterogeneous appearance of the liver parenchyma without evidence for a discrete hepatic mass. Portal vein is patent on color Doppler imaging with normal direction of blood flow towards the liver. Other: Incidentally noted is right-sided hydronephrosis. IMPRESSION: 1. Cholelithiasis without definite CT evidence for acute cholecystitis. However, the gallbladder is distended with a stone that appears to be wedged at the gallbladder neck. 2. Incidentally noted right-sided hydronephrosis.  Electronically Signed   By: Constance Holster M.D.   On: 11/26/2019 17:39   Medications: . sodium chloride    . sodium chloride     . apraclonidine  1 drop Right Eye TID  . Chlorhexidine Gluconate Cloth  6 each Topical Q0600  . doxercalciferol  7 mcg Intravenous Q T,Th,Sa-HD  . gabapentin  100 mg Oral QHS  . heparin  5,000 Units Subcutaneous Q8H  . insulin aspart  0-5 Units Subcutaneous QHS  . insulin aspart  0-6 Units Subcutaneous TID WC  . latanoprost  1 drop Right Eye QHS  . pantoprazole  40 mg Oral Daily  . pilocarpine  1 drop Right Eye QID

## 2019-11-28 NOTE — Progress Notes (Signed)
Subjective: Finished dialysis. No further chest/epigastric pain.  Objective: Vital signs in last 24 hours: Temp:  [97.8 F (36.6 C)-98.9 F (37.2 C)] 98.9 F (37.2 C) (09/25 1045) Pulse Rate:  [71-92] 84 (09/25 1045) Resp:  [14-20] 14 (09/25 1045) BP: (119-155)/(56-79) 155/71 (09/25 1045) SpO2:  [96 %-98 %] 98 % (09/25 1045) Weight:  [73.7 kg-76.2 kg] 73.7 kg (09/25 1045) Weight change: 0 kg Last BM Date: 11/27/19  PE: GEN:  NAD  Lab Results: CBC    Component Value Date/Time   WBC 6.3 11/28/2019 0248   RBC 3.45 (L) 11/28/2019 0248   HGB 11.3 (L) 11/28/2019 0248   HGB 12.7 (L) 02/19/2018 1014   HCT 35.0 (L) 11/28/2019 0248   HCT 38.7 02/19/2018 1014   PLT 208 11/28/2019 0248   PLT 230 02/19/2018 1014   MCV 101.4 (H) 11/28/2019 0248   MCV 95 02/19/2018 1014   MCH 32.8 11/28/2019 0248   MCHC 32.3 11/28/2019 0248   RDW 13.0 11/28/2019 0248   RDW 13.6 02/19/2018 1014   LYMPHSABS 2.1 11/26/2019 1112   MONOABS 1.3 (H) 11/26/2019 1112   EOSABS 0.2 11/26/2019 1112   BASOSABS 0.0 11/26/2019 1112   CMP     Component Value Date/Time   NA 139 11/28/2019 0248   NA 134 (A) 09/11/2019 0000   K 4.1 11/28/2019 0248   CL 100 11/28/2019 0248   CO2 25 11/28/2019 0248   GLUCOSE 126 (H) 11/28/2019 0248   BUN 41 (H) 11/28/2019 0248   BUN 34 (A) 09/11/2019 0000   CREATININE 9.36 (H) 11/28/2019 0248   CALCIUM 9.5 11/28/2019 0248   PROT 6.0 (L) 11/27/2019 1038   PROT 6.9 02/19/2018 1014   ALBUMIN 3.3 (L) 11/27/2019 1038   ALBUMIN 4.2 02/19/2018 1014   AST 41 11/27/2019 1038   ALT 14 11/27/2019 1038   ALKPHOS 72 11/27/2019 1038   BILITOT 1.3 (H) 11/27/2019 1038   BILITOT 0.5 02/19/2018 1014   GFRNONAA 5 (L) 11/28/2019 0248   GFRAA 5 (L) 11/28/2019 0248   Assessment:  1.  Chest/epigastric pain.  No acute EKG changes.  Atypical for cardiac, yet with elevated troponin. 2.  Elevated troponin.  Plan:  1.  I have discussed case with Dr. Louanne Belton, who will check with  cardiology to see if any further cardiac work-up/testing is needed pre-colonoscopy. 2.  Tentative plan for colonoscopy Monday, pending any further cardiology recommendations. 3.  OK for soft diet today; if OK for colonoscopy Monday, would restart clear liquids tomorrow. 4.  Case discussed with patient and son, Ronalee Belts, at bedside. 5.  [facility] will follow.   Landry Dyke 11/28/2019, 1:05 PM   Cell [phone] If no answer or after 5 PM call [phone]

## 2019-11-28 NOTE — Progress Notes (Signed)
PROGRESS NOTE  Elijah White UJW:[id number] DOB: Sep 20, 1940 DOA: 11/26/2019 PCP: [name], MD   LOS: 2 days   Brief narrative: As per HPI,  Elijah White is a 79 y.o. male with medical history significant of ESRD on dialysis (TTS), hypertension, hyperlipidemia, diabetes mellitus, GERD, legally blind in left eye, chronic diastolic CHF presented to the emergency department for evaluation of chest pain/epigastric pain, shortness of breath and generalized weakness for 1 day.  Patient's daughter was driving him to dialysis unit when he developed chest pain so he was brought into the hospital he also had shortness of breath.  Patient had also lost 10 to 12 pounds in last 71-month since he has been started on dialysis.  Patient daughter stated that he lives alone at home and he is independent on daily life activities.  She is concerned as patient is legally blind from left eye and head recent fall with bruises on his arm and is concerned about safety at home.  Upon arrival to ED: Patient's vital signs stable, afebrile, no leukocytosis, maintaining oxygen saturation on room air, initial troponin 36 trended up to 37, EKG: No acute changes, COVID-19 negative.  CT head negative for acute findings, lipase: WNL.  CTA chest: Negative for PE or aortic dissection.  CT was concerning for 2.3 cm in size transverse colon mass, 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.  Chest x-ray shows pulmonary vascular congestion without pulmonary edema, mild cardiomegaly.  EDP consulted nephrology.  Patient was then admitted to hospital for further work-up and treatment.  Assessment/Plan:  Principal Problem:   Chest pain Active Problems:   Type 2 diabetes mellitus with renal manifestations (HCC)   Essential hypertension   GERD (gastroesophageal reflux disease)   HLD (hyperlipidemia)   ESRD (end stage renal disease) (HCC)   Macrocytic anemia   Adrenal mass (HCC)   Colonic mass  Chest pain/epigastric pain shortness of  breath. Chest x-ray showed some pulmonary vascular congestion, mild cardiomegaly.  No further chest pain.  Patient was seen during hemodialysis today.  There is a possibility of biliary colic atypical pain.  Currently no pain.[name]  Ultrasound shows possible stone in the gallbladder.  Patient is not inclined for surgical intervention and symptomatic treatment is likely to be pursued.  Spoke with the patient's daughter about it as well.  Continue PPI.  Troponin borderline elevated but in the context of ESRD.  EKG was unremarkable.  Cardiology will be consulted/ request for cardiac clearance and shortness of breath evaluation..  Cholelithiasis without cholecystitis.  Possibility of biliary colic.  Possible conservative treatment at this time.  Could try ursedeoxycholic acid.   ESRD on hemodialysis: Tuesday Thursday Saturday schedule as outpatient..  Nephrology on board for hemodialysis.  Transverse colon mass: 2.3 cm in size.  Incidental finding. -GI on board for colonoscopy in a.m.  Patient did have colonoscopy in 2009 and polyps were removed. .  GI today recommended cardiac clearance prior to colonoscopy.  Patient did have a borderline elevated troponin.  Communicated with Dr. Lovena Le regarding this.  Essential hypertension: Stable Continue Coreg, hydralazine, Imdur.  Continue to monitor blood pressure closely. -Hydralazine as needed for blood pressure more than 160/100.  Diabetes mellitus type II: Continue sliding scale insulin, Accu-Cheks, diabetic diet.  Last POC glucose of 119  GERD: Continue PPI  Diabetic neuropathy: Continue gabapentin  Chronic diastolic CHF: Continue Imdur, statin, Coreg. 2D echocardiogram from 09/02/2017 which showed ejection fraction of 65 to 70% and grade 1 diastolic dysfunction.  Continue intake and  output charting Daily weights.  Hyperlipidemia: Continue statin  Macrocytic anemia: TSH of 0.8.  Vitamin B-12 level elevated.  Folate level within normal  limits.  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  Incidental finding.  GI has been consulted for colonic mass.  Further work-up of adrenal mass if desired after GI evaluation.   Distended gallbladder with small gallstones: No evidence of cholecystitis on CT scan of the chest.  Right upper quadrant ultrasound shows cholelithiasis without acute evidence for cholecystitis.  There is possibility of stone at the gallbladder neck.  Right-sided hydronephrosis was also noted.  Lipase within normal range.  GI on board.  Debility, deconditioning, legal blindness.  Recent fall at home.  Patient lives alone.  Safety concern due to recent fall at home.  Patient's daughter wishes skilled nursing facility on discharge.  Transition of care has been consulted.  DVT prophylaxis: heparin injection 5,000 Units Start: 11/26/19 1600 SCDs Start: 11/26/19 1554     Code Status: DNR  Family Communication: I spoke with the patient's daughter on the phone and updated her about the clinical condition of the patient.   Status is: Inpatient  Remains inpatient appropriate because:Inpatient level of care appropriate due to severity of illness and Colonoscopic evaluation.,  Unsafe disposition, likely need a skilled nursing facility placement.   Dispo: The patient is from: Home              Anticipated d/c is to: SNF              Anticipated d/c date is: 2 days              Patient currently is not medically stable to d/c.  Consultants:  GI  Nephrology   Procedures:  Hemodialysis  Antibiotics:  . None  Anti-infectives (From admission, onward)   None      Subjective: Today, patient was seen and examined at bedside.   Denies any abdominal pain, nausea, vomiting, chest pain.  Complains of mild shortness of breath.  Seen during hemodialysis.  Objective: Vitals:   11/28/19 0710 11/28/19 0730  BP: (!) 154/79 (!) 151/78  Pulse: 76 78  Resp: 19   Temp:    SpO2:      Intake/Output Summary (Last 24 hours) at  11/28/2019 0859 Last data filed at 11/28/2019 0700 Gross per 24 hour  Intake 960 ml  Output 251 ml  Net 709 ml   Filed Weights   11/28/19 0431 11/28/19 0652 11/28/19 0705  Weight: 76.2 kg 75.3 kg 75.2 kg   Body mass index is 32.38 kg/m.   Physical Exam:  GENERAL: Patient is alert awake and communicative. Not in obvious distress. HENT: Conjunctival redness.  Pupils equally reactive to light. Oral mucosa is moist. Legally blind on the left eye. NECK: is supple, no gross swelling noted. CHEST: Clear to auscultation. No crackles or wheezes.  Diminished breath sounds bilaterally. CVS: S1 and S2 heard, systolic murmur noted.  Regular rate and rhythm.  ABDOMEN: Soft, non-tender, bowel sounds are present. EXTREMITIES: No edema.  Left upper extremity AV fistula in place. Right upper extremity with bruise. CNS: Cranial nerves are intact. No focal motor deficits.  SKIN: warm and dry without rashes.  Right upper extremity bruise  Data Review: I have personally reviewed the following laboratory data and studies,  CBC: Recent Labs  Lab 11/26/19 1112 11/26/19 1816 11/27/19 1038 11/28/19 0248  WBC 7.6 6.2 7.0 6.3  NEUTROABS 4.0  --   --   --  HGB 11.7* 12.1* 12.1* 11.3*  HCT 36.8* 36.9* 36.6* 35.0*  MCV 104.8* 102.5* 103.1* 101.4*  PLT 194 209 214 850   Basic Metabolic Panel: Recent Labs  Lab 11/26/19 1112 11/26/19 1816 11/27/19 1038 11/28/19 0248  NA 140  --  139 139  K 4.1  --  6.0* 4.1  CL 100  --  100 100  CO2 29  --  24 25  GLUCOSE 199*  --  132* 126*  BUN 27*  --  34* 41*  CREATININE 6.92* 7.33* 7.96* 9.36*  CALCIUM 9.8  --  9.3 9.5  MG  --  2.4  --  2.3   Liver Function Tests: Recent Labs  Lab 11/26/19 1112 11/27/19 1038  AST 19 41  ALT 15 14  ALKPHOS 99 72  BILITOT 0.5 1.3*  PROT 7.0 6.0*  ALBUMIN 3.7 3.3*   Recent Labs  Lab 11/26/19 1112  LIPASE 29   No results for input(s): AMMONIA in the last 168 hours. Cardiac Enzymes: No results for input(s):  CKTOTAL, CKMB, CKMBINDEX, TROPONINI in the last 168 hours. BNP (last 3 results) No results for input(s): BNP in the last 8760 hours.  ProBNP (last 3 results) No results for input(s): PROBNP in the last 8760 hours.  CBG: Recent Labs  Lab 11/27/19 0615 11/27/19 1210 11/27/19 1719 11/27/19 2147 11/28/19 0623  GLUCAP 101* 120* 197* 144* 119*   Recent Results (from the past 240 hour(s))  Respiratory Panel by RT PCR (Flu A&B, Covid) - Nasopharyngeal Swab     Status: None   Collection Time: 11/26/19 11:12 AM   Specimen: Nasopharyngeal Swab  Result Value Ref Range Status   SARS Coronavirus 2 by RT PCR NEGATIVE NEGATIVE Final    Comment: (NOTE) SARS-CoV-2 target nucleic acids are NOT DETECTED.  The SARS-CoV-2 RNA is generally detectable in upper respiratoy specimens during the acute phase of infection. The lowest concentration of SARS-CoV-2 viral copies this assay can detect is 131 copies/mL. A negative result does not preclude SARS-Cov-2 infection and should not be used as the sole basis for treatment or other patient management decisions. A negative result may occur with  improper specimen collection/handling, submission of specimen other than nasopharyngeal swab, presence of viral mutation(s) within the areas targeted by this assay, and inadequate number of viral copies (<131 copies/mL). A negative result must be combined with clinical observations, patient history, and epidemiological information. The expected result is Negative.  Fact Sheet for Patients:  PinkCheek.be  Fact Sheet for Healthcare Providers:  GravelBags.it  This test is no t yet approved or cleared by the [place] FDA and  has been authorized for detection and/or diagnosis of SARS-CoV-2 by FDA under an Emergency Use Authorization (EUA). This EUA will remain  in effect (meaning this test can be used) for the duration of the COVID-19 declaration  under Section 564(b)(1) of the Act, 21 U.S.C. section 360bbb-3(b)(1), unless the authorization is terminated or revoked sooner.     Influenza A by PCR NEGATIVE NEGATIVE Final   Influenza B by PCR NEGATIVE NEGATIVE Final    Comment: (NOTE) The Xpert Xpress SARS-CoV-2/FLU/RSV assay is intended as an aid in  the diagnosis of influenza from Nasopharyngeal swab specimens and  should not be used as a sole basis for treatment. Nasal washings and  aspirates are unacceptable for Xpert Xpress SARS-CoV-2/FLU/RSV  testing.  Fact Sheet for Patients: PinkCheek.be  Fact Sheet for Healthcare Providers: GravelBags.it  This test is not yet approved or cleared by the  [place] Architectural [employer] and  has been authorized for detection and/or diagnosis of SARS-CoV-2 by  FDA under an Print production planner (EUA). This EUA will remain  in effect (meaning this test can be used) for the duration of the  Covid-19 declaration under Section 564(b)(1) of the Act, 21  U.S.C. section 360bbb-3(b)(1), unless the authorization is  terminated or revoked. Performed at [facility], [place] [address] [place], [place] [address]   MRSA PCR Screening     Status: None   Collection Time: 11/26/19  9:49 PM   Specimen: Nasal Mucosa; Nasopharyngeal  Result Value Ref Range Status   MRSA by PCR NEGATIVE NEGATIVE Final    Comment:        The GeneXpert MRSA Assay (FDA approved for NASAL specimens only), is one component of a comprehensive MRSA colonization surveillance program. It is not intended to diagnose MRSA infection nor to guide or monitor treatment for MRSA infections. Performed at [facility], [place] [address] [place], [place] [address]      Studies: CT Head Wo Contrast  Result Date: 11/26/2019 CLINICAL DATA:  Mental status change EXAM: CT HEAD WITHOUT CONTRAST TECHNIQUE: Contiguous axial images were obtained from the base of the skull through  the vertex without intravenous contrast. COMPARISON:  CT head 10/20/2017 FINDINGS: Brain: Cerebral ventricle sizes are concordant with the degree of cerebral volume loss. Patchy and confluent areas of decreased attenuation are noted throughout the deep and periventricular white matter of the cerebral hemispheres bilaterally, compatible with chronic microvascular ischemic disease. No evidence of large-territorial acute infarction. No parenchymal hemorrhage. No mass lesion. No extra-axial collection. No mass effect or midline shift. No hydrocephalus. Basilar cisterns are patent. Vascular: No hyperdense vessel or unexpected calcification. Skull: Interval increase in size of a 5.7 x 2.5 cm fluid density lesion within the subcutaneus soft tissues of the occipital scalp. The lesion has slightly thick walls and persistent slight heterogeneity within. This finding likely represents sebaceous cyst. Negative for fracture or focal lesion. Sinuses/Orbits: Paranasal sinuses and mastoid air cells are clear. Status post bilateral lens replacement. Redemonstration of left glaucoma drainage device. Similar-appearing 1.2 cm fat density lesion lateral to the right orbit anterior to the zygomatic bone that may be surgical versus a benign finding (4:12). Otherwise the orbits are unremarkable. Other: Atherosclerotic calcifications are present within the cavernous internal carotid arteries and bilateral vertebral arteries. IMPRESSION: No acute intracranial abnormality. Electronically Signed   By: Iven Finn M.D.   On: 11/26/2019 15:34   DG Chest Port 1 View  Result Date: 11/26/2019 CLINICAL DATA:  Chest pain. EXAM: PORTABLE CHEST 1 VIEW COMPARISON:  03/25/2018 FINDINGS: Mild enlargement of the cardiac silhouette, similar to prior. Pulmonary vascular congestion. No evidence of pulmonary edema. No consolidation. No visible pleural effusions. No pneumothorax. The visualized skeletal structures are unremarkable. IMPRESSION: 1.  Pulmonary vascular congestion without evidence of pulmonary edema. 2. Similar mild cardiomegaly. Electronically Signed   By: Margaretha Sheffield MD   On: 11/26/2019 11:50   CT Angio Chest/Abd/Pel for Dissection W and/or W/WO  Result Date: 11/26/2019 CLINICAL DATA:  Chest and back pain EXAM: CT ANGIOGRAPHY CHEST, ABDOMEN AND PELVIS TECHNIQUE: Non-contrast CT of the chest was initially obtained. Multidetector CT imaging through the chest, abdomen and pelvis was performed using the standard protocol during bolus administration of intravenous contrast. Multiplanar reconstructed images and MIPs were obtained and reviewed to evaluate the vascular anatomy. CONTRAST:  186mL OMNIPAQUE IOHEXOL 350 MG/ML SOLN COMPARISON:  11/26/2019 FINDINGS: CTA  CHEST FINDINGS Cardiovascular: The thoracic aorta is unremarkable without aneurysm or dissection. Moderate atherosclerosis of the aortic arch and at the origin of the great vessels. The heart is unremarkable without pericardial effusion. Calcification of the mitral and aortic valves are noted. Moderate atherosclerosis of the coronary vessels. While not optimized for opacification of the pulmonary vasculature, there is sufficient contrast enhancement to exclude pulmonary emboli. Mediastinum/Nodes: No enlarged mediastinal, hilar, or axillary lymph nodes. Thyroid gland, trachea, and esophagus demonstrate no significant findings. Lungs/Pleura: No acute airspace disease, effusion, or pneumothorax. The central airways are patent. Musculoskeletal: No acute or destructive bony lesions. Reconstructed images demonstrate no additional findings. Review of the MIP images confirms the above findings. CTA ABDOMEN AND PELVIS FINDINGS VASCULAR Aorta: Normal caliber aorta without aneurysm, dissection, vasculitis or significant stenosis. Celiac: Patent without evidence of aneurysm, dissection, vasculitis or significant stenosis. SMA: Patent without evidence of aneurysm, dissection, vasculitis or  significant stenosis. Renals: There is severe atherosclerosis of the bilateral renal arteries, left greater than right. Multifocal high-grade stenosis at the origin of the left renal artery. IMA: Patent without evidence of aneurysm, dissection, vasculitis or significant stenosis. Inflow: Extensive atherosclerosis without high-grade stenosis. Veins: No obvious venous abnormality within the limitations of this arterial phase study. Review of the MIP images confirms the above findings. NON-VASCULAR Hepatobiliary: The gallbladder is markedly distended with numerous small calcified gallstones layering dependently. No gallbladder wall thickening or surrounding inflammation to suggest cholecystitis. The liver is unremarkable. No biliary dilation. Pancreas: Unremarkable. No pancreatic ductal dilatation or surrounding inflammatory changes. Spleen: Normal in size without focal abnormality. Adrenals/Urinary Tract: 2.7 cm indeterminate left adrenal mass. Right adrenals wrist unremarkable. There is bilateral renal cortical thinning and scarring. Otherwise normal enhancement. There is distension of the bilateral renal pelves and bilateral ureters, without clear cause for obstruction. This could be sequela of chronic bladder outlet obstruction. No filling defects within the urinary bladder. Stomach/Bowel: No bowel obstruction or ileus. There is an enhancing 2.3 cm mass within the lumen of the mid transverse colon, reference axial image 97 and coronal image 36, concerning for neoplasm. Colonoscopy recommended. The bladder is unremarkable. No bowel wall thickening or inflammatory change. Lymphatic: No pathologic adenopathy. Reproductive: Radiotherapy seeds are seen within the prostate. Prostate is mildly enlarged. Other: No free fluid or free gas. No abdominal wall hernia. Musculoskeletal: No acute or destructive bony lesions. Reconstructed images demonstrate no additional findings. Review of the MIP images confirms the above  findings. IMPRESSION: 1. No evidence of thoracic or abdominal aortic aneurysm or dissection. No evidence of pulmonary embolus. 2. A 2.3 cm enhancing mass within the lumen of the mid transverse colon, concerning for neoplasm. Colonoscopy recommended for further evaluation. 3. Markedly distended gallbladder with numerous small calcified gallstones layering dependently. No evidence of cholecystitis. 4. 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 If further evaluation is warranted, nonemergent MRI could be performed. 5. Fullness of the bilateral renal pelves and ureters, without clear cause for obstruction. This could be sequela of chronic bladder outlet obstruction given the enlarged prostate. 6. Aortic Atherosclerosis (ICD10-I70.0). Electronically Signed   By: Randa Ngo M.D.   On: 11/26/2019 15:34   US Abdomen Limited RUQ  Result Date: 11/26/2019 CLINICAL DATA:  Abdominal pain EXAM: ULTRASOUND ABDOMEN LIMITED RIGHT UPPER QUADRANT COMPARISON:  None. FINDINGS: Gallbladder: The gallbladder is filled with gallbladder sludge and multiple gallstones. The gallbladder is distended. There is a stone that appears to be wedged at the gallbladder neck. There is  no gallbladder wall thickening. The sonographic Percell Miller sign is reported as negative. Common bile duct: Diameter: 3 mm Liver: There is a heterogeneous appearance of the liver parenchyma without evidence for a discrete hepatic mass. Portal vein is patent on color Doppler imaging with normal direction of blood flow towards the liver. Other: Incidentally noted is right-sided hydronephrosis. IMPRESSION: 1. Cholelithiasis without definite CT evidence for acute cholecystitis. However, the gallbladder is distended with a stone that appears to be wedged at the gallbladder neck. 2. Incidentally noted right-sided hydronephrosis. Electronically Signed   By: Constance Holster M.D.   On: 11/26/2019 17:39      Flora Lipps,  MD  [facility] 11/28/2019

## 2019-11-28 NOTE — Progress Notes (Signed)
Physical Therapy Evaluation Patient Details Name: Elijah White MRN: [id number] DOB: 1940-08-27 Today's Date: 11/28/2019   History of Present Illness  Elijah White is a 79 y.o. male with medical history significant of ESRD on dialysis (TTS), hypertension, hyperlipidemia, diabetes mellitus, GERD, legally blind in left eye, chronic diastolic CHF presented to the emergency department for evaluation of chest pain/epigastric pain, shortness of breath and generalized weakness for 1 day.  Work up to r/o cardiac vs GI cause.  Colonoscopy pending.  Clinical Impression  Pt admitted with/for CP/epigastric pain.  Pt presents with general weakness, I suspect from relative inactivity.  He is tentative of gait as expected due to an unfamiliar environment, but still shows to be weak.  He could benefit from some therapy to improve strength and functional Independence..  Pt currently limited functionally due to the problems listed. ( See problems list.)   Pt will benefit from PT to maximize function and safety in order to get ready for next venue listed below.     Follow Up Recommendations SNF;Supervision - Intermittent;Supervision/Assistance - 24 hour;Other (comment) (Work toward home with up to 24 hour assist initially)    Equipment Recommendations  None recommended by PT;Other (comment) (3in or tub chair if to go home.)    Recommendations for Other Services       Precautions / Restrictions Precautions Precautions: Fall Precaution Comments: legally blind.      Mobility  Bed Mobility               General bed mobility comments: OOB in the recliner on arrival  Transfers Overall transfer level: Needs assistance   Transfers: Sit to/from Stand Sit to Stand: Min assist         General transfer comment: tactile direction for low vision.  Assist to come forward and up.  Ambulation/Gait Ambulation/Gait assistance: Min assist Gait Distance (Feet): 35 Feet Assistive device: 1 person hand  held assist Gait Pattern/deviations: Step-through pattern Gait velocity: slow Gait velocity interpretation: <1.31 ft/sec, indicative of household ambulator General Gait Details: short tentative steps due to unfamiliar environment and low vision  Stairs            Wheelchair Mobility    Modified Rankin (Stroke Patients Only)       Balance Overall balance assessment: Needs assistance Sitting-balance support: Single extremity supported;No upper extremity supported Sitting balance-Leahy Scale: Fair     Standing balance support: Single extremity supported;During functional activity Standing balance-Leahy Scale: Poor Standing balance comment: reliant on AD or external assist                             Pertinent Vitals/Pain Pain Assessment: Faces Faces Pain Scale: Hurts a little bit Pain Location: LE's with ROM Pain Descriptors / Indicators: Grimacing Pain Intervention(s): Monitored during session    Home Living Family/patient expects to be discharged to:: Skilled nursing facility Living Arrangements: Alone Available Help at Discharge: Family;Available PRN/intermittently Type of Home: Apartment Home Access: Stairs to enter Entrance Stairs-Rails: None Entrance Stairs-Number of Steps: 1-2 Home Layout: One level Home Equipment: Walker - 2 wheels      Prior Function Level of Independence: Independent with assistive device(s) (in the home  assisted to mobilize in unfamiliar areas)               Hand Dominance        Extremity/Trunk Assessment   Upper Extremity Assessment Upper Extremity Assessment: Overall WFL for tasks assessed (  basic testing during function)    Lower Extremity Assessment Lower Extremity Assessment: Overall WFL for tasks assessed (general weakness in large muscle groups bil)       Communication   Communication: No difficulties  Cognition Arousal/Alertness: Awake/alert Behavior During Therapy: Flat affect Overall  Cognitive Status: Within Functional Limits for tasks assessed                                        General Comments      Exercises     Assessment/Plan    PT Assessment Patient needs continued PT services  PT Problem List Decreased strength;Decreased activity tolerance;Decreased balance;Decreased mobility;Decreased knowledge of use of DME;Cardiopulmonary status limiting activity       PT Treatment Interventions Gait training;Functional mobility training;Therapeutic activities;Balance training;Patient/family education;DME instruction    PT Goals (Current goals can be found in the Care Plan section)  Acute Rehab PT Goals Patient Stated Goal: get home. PT Goal Formulation: With patient Time For Goal Achievement: 12/12/19 Potential to Achieve Goals: Good    Frequency Min 3X/week   Barriers to discharge Decreased caregiver support      Co-evaluation               AM-PAC PT "6 Clicks" Mobility  Outcome Measure Help needed turning from your back to your side while in a flat bed without using bedrails?: A Little Help needed moving from lying on your back to sitting on the side of a flat bed without using bedrails?: A Little Help needed moving to and from a bed to a chair (including a wheelchair)?: A Little Help needed standing up from a chair using your arms (e.g., wheelchair or bedside chair)?: A Little Help needed to walk in hospital room?: A Little Help needed climbing 3-5 steps with a railing? : A Lot 6 Click Score: 17    End of Session   Activity Tolerance: Patient tolerated treatment well Patient left: in chair;with call bell/phone within reach;with family/visitor present Nurse Communication: Mobility status PT Visit Diagnosis: Unsteadiness on feet (R26.81);Muscle weakness (generalized) (M62.81);Difficulty in walking, not elsewhere classified (R26.2)    Time: 4401-0272 PT Time Calculation (min) (ACUTE ONLY): 24 min   Charges:   PT  Evaluation $PT Eval Moderate Complexity: 1 Mod PT Treatments $Gait Training: 8-22 mins        11/28/2019  Ginger Carne., PT Acute Rehabilitation Services [phone]  (pager) [phone]  (office)  Tessie Fass Delmar Dondero 11/28/2019, 1:36 PM

## 2019-11-28 NOTE — Progress Notes (Signed)
Dietary secretary called in an attempt to get an early breakfast tray for this pt due to 1st case HD today.

## 2019-11-28 NOTE — Progress Notes (Signed)
°  Echocardiogram 2D Echocardiogram has been performed.  Elijah White 11/28/2019, 4:59 PM

## 2019-11-28 NOTE — Consult Note (Signed)
Cardiology Consultation:   Patient ID: Elijah White MRN: [id number]; DOB: 02-21-1941  Admit date: 11/26/2019 Date of Consult: 11/28/2019  Primary Care Provider: [name], MD [facility] Cardiologist: No primary care provider on file.  [facility] Electrophysiologist:  None none   Patient Profile:   Elijah White is a 79 y.o. male with a hx of ESRD on HD who is being seen today for the evaluation of preoperative evaluation at the request of Dr. Louanne Belton.  History of Present Illness:   Elijah White was admitted to the hospital with chest pain, sob, epigastric and lefut upper quadrant pain and workup demonstrates a stone in the gall bladder. He is anemic. He missed his HD on Thursday when he was brought to the ED.  In addition he has an enhancing mass in the transverse colon. He notes that his stools have changed caliber and consistency. He denies chest pain on my exam. He has known polyps. He has never been told that he has a heart murmur. He had no MR on exam 2 years ago. An echo on this admit is pending.   Past Medical History:  Diagnosis Date  . Anemia    low iron  . Arthritis   . Cancer [facility]) ?2006   prostate  . Chronic kidney disease    Stage 4  . Colon polyps ~ 1993 and 2003   Dr [name], [facility].  08/2001 colonoscopy: tubular adenoma at cecum.    . Diabetes mellitus without complication ([place])   . Elevated cholesterol with high triglycerides   . GERD (gastroesophageal reflux disease)   . Hypertension   . Legally blind in left eye, as defined in [place]    has pinpoint vision in right eye    Past Surgical History:  Procedure Laterality Date  . AV FISTULA PLACEMENT Left 03/20/2018   Procedure: ARTERIOVENOUS (AV) FISTULA CREATION ARM;  Surgeon: [name], MD;  Location: [place];  Service: Vascular;  Laterality: Left;  . BASCILIC VEIN TRANSPOSITION Left 05/21/2018   Procedure: LEFT BASILIC VEIN FISTULA SECOND STAGE;  Surgeon: [name], MD;  Location: [place];  Service: Vascular;  Laterality: Left;  . COLONOSCOPY    . GLAUCOMA SURGERY  2019   multiple surgeries  . KNEE ARTHROSCOPY    . PROSTATE SURGERY       Home Medications:  Prior to Admission medications   Medication Sig Start Date End Date Taking? Authorizing Provider  acetaminophen (TYLENOL) 500 MG tablet Take 2 tablets (1,000 mg total) by mouth every 6 (six) hours as needed for mild pain, moderate pain or headache. 11/20/19  Yes Burky, Lanelle Bal B, NP  apraclonidine (IOPIDINE) 0.5 % ophthalmic solution Place 1 drop into the right eye 3 (three) times daily.  08/04/19  Yes [provider]  atorvastatin (LIPITOR) 10 MG tablet TAKE 1 TABLET BY MOUTH EVERY DAY Patient taking differently: Take 10 mg by mouth daily.  11/23/19  Yes [name], MD  AURYXIA 1 GM 210 MG(Fe) tablet Take 210 mg by mouth 3 (three) times daily with meals.  07/20/19  Yes [provider]  B Complex-C-Folic Acid (DIALYVITE 229) 0.8 MG TABS Take 1 tablet by mouth daily. 05/08/19  Yes [provider]  carvedilol (COREG) 3.125 MG tablet TAKE 1 TABLET(3.125 MG) BY MOUTH TWICE DAILY WITH A MEAL Patient taking differently: Take 3.125 mg by mouth 2 (two) times daily with a meal.  11/02/19  Yes [name], MD  diclofenac Sodium (  VOLTAREN) 1 % GEL Apply 4 g topically 4 (four) times daily as needed. Patient taking differently: Apply 4 g topically 4 (four) times daily as needed (pain).  11/20/19  Yes Burky, Lanelle Bal B, NP  dorzolamide-timolol (COSOPT) 22.3-6.8 MG/ML ophthalmic solution Place 1 drop into the right eye 2 (two) times daily. 06/25/17  Yes [provider]  esomeprazole (NEXIUM) 40 MG capsule TAKE 1 CAPSULE BY MOUTH EVERY DAY 06/14/19  Yes [name], MD  gabapentin (NEURONTIN) 100 MG capsule TAKE 1 CAPSULE(100 MG) BY MOUTH AT BEDTIME Patient taking differently: Take 100 mg by mouth at bedtime.  10/12/19  Yes Minette Brine, FNP  hydrALAZINE (APRESOLINE) 25 MG  tablet TAKE 1 TABLET(25 MG) BY MOUTH TWICE DAILY Patient taking differently: Take 25 mg by mouth in the morning and at bedtime.  06/25/19  Yes [name], MD  hydroxypropyl methylcellulose / hypromellose (ISOPTO TEARS / GONIOVISC) 2.5 % ophthalmic solution Place 1 drop into the left eye as needed for dry eyes.   Yes [provider]  isosorbide mononitrate (IMDUR) 30 MG 24 hr tablet TAKE 1 TABLET(30 MG) BY MOUTH DAILY Patient taking differently: Take 30 mg by mouth daily.  11/02/19  Yes [name], MD  latanoprost (XALATAN) 0.005 % ophthalmic solution Place 1 drop into the right eye at bedtime.   Yes [provider]  pilocarpine (PILOCAR) 4 % ophthalmic solution Place 1 drop into the right eye 4 (four) times daily.    Yes [provider]  BD PEN NEEDLE NANO U/F 32G X 4 MM MISC USE SUBCUTANEOUS FOUR TIMES A DAY 03/31/18   [provider]  blood glucose meter kit and supplies KIT Dispense based on patient and insurance preference. Use up to four times daily as directed. (FOR ICD-9 250.00, 250.01). For QAC - HS accuchecks. 03/30/18   Thurnell Lose, MD  Lancets (ONETOUCH DELICA PLUS [id number]) [place] USE AS DIRECTED UP TO FOUR TIMES A DAY 03/31/18   [provider]  ONETOUCH VERIO test strip USE AS DIRECTED TO TEST FOUR TIMES A DAY 07/31/18   [name], MD    Inpatient Medications: Scheduled Meds: . apraclonidine  1 drop Right Eye TID  . Chlorhexidine Gluconate Cloth  6 each Topical Q0600  . doxercalciferol  7 mcg Intravenous Q T,Th,Sa-HD  . gabapentin  100 mg Oral QHS  . heparin  5,000 Units Subcutaneous Q8H  . insulin aspart  0-5 Units Subcutaneous QHS  . insulin aspart  0-6 Units Subcutaneous TID WC  . latanoprost  1 drop Right Eye QHS  . pantoprazole  40 mg Oral Daily  . pilocarpine  1 drop Right Eye QID   Continuous Infusions:  PRN Meds: acetaminophen **OR** acetaminophen, hydrALAZINE, hydrocortisone cream, HYDROmorphone (DILAUDID)  injection, ondansetron **OR** ondansetron (ZOFRAN) IV  Allergies:   No Known Allergies  Social History:   Social History   Socioeconomic History  . Marital status: Widowed    Spouse name: Not on file  . Number of children: Not on file  . Years of education: Not on file  . Highest education level: Not on file  Occupational History  . Occupation: retired  Tobacco Use  . Smoking status: Former Smoker    Packs/day: 0.50    Quit date: 03/23/2018    Years since quitting: 1.6  . Smokeless tobacco: Never Used  Vaping Use  . Vaping Use: Never used  Substance and Sexual Activity  . Alcohol use: No  . Drug use: No  . Sexual activity:  Not Currently  Other Topics Concern  . Not on file  Social History Narrative  . Not on file   Social Determinants of Health   Financial Resource Strain: Low Risk   . Difficulty of Paying Living Expenses: Not hard at all  Food Insecurity: No Food Insecurity  . Worried About Charity fundraiser in the Last Year: Never true  . Ran Out of Food in the Last Year: Never true  Transportation Needs: No Transportation Needs  . Lack of Transportation (Medical): No  . Lack of Transportation (Non-Medical): No  Physical Activity: Inactive  . Days of Exercise per Week: 0 days  . Minutes of Exercise per Session: 0 min  Stress: No Stress Concern Present  . Feeling of Stress : Not at all  Social Connections:   . Frequency of Communication with Friends and Family: Not on file  . Frequency of Social Gatherings with Friends and Family: Not on file  . Attends Religious Services: Not on file  . Active Member of Clubs or Organizations: Not on file  . Attends Archivist Meetings: Not on file  . Marital Status: Not on file  Intimate Partner Violence:   . Fear of Current or Ex-Partner: Not on file  . Emotionally Abused: Not on file  . Physically Abused: Not on file  . Sexually Abused: Not on file    Family History:    Family History  Problem Relation  Age of Onset  . CAD Mother      ROS:  Please see the history of present illness.   All other ROS reviewed and negative.     Physical Exam/Data:   Vitals:   11/28/19 0930 11/28/19 1000 11/28/19 1030 11/28/19 1045  BP: (!) 120/56 119/64 (!) 143/68 (!) 155/71  Pulse: 92 89 84 84  Resp:  20  14  Temp:    98.9 F (37.2 C)  TempSrc:    Oral  SpO2:    98%  Weight:    73.7 kg  Height:        Intake/Output Summary (Last 24 hours) at 11/28/2019 1341 Last data filed at 11/28/2019 1045 Gross per 24 hour  Intake 480 ml  Output 1750 ml  Net -1270 ml   Last 3 Weights 11/28/2019 11/28/2019 11/28/2019  Weight (lbs) 162 lb 7.7 oz 165 lb 12.6 oz 166 lb  Weight (kg) 73.7 kg 75.2 kg 75.297 kg     Body mass index is 31.73 kg/m.  General:  Well nourished, well developed, in no acute distress HEENT: normal Lymph: no adenopathy Neck: 7 cm JVD Endocrine:  No thryomegaly Vascular: No carotid bruits; FA pulses 2+ bilaterally without bruits  Cardiac:  normal S1, S2; RRR; 3/6 systolic murmur most likely MR Lungs:  clear to auscultation bilaterally, no wheezing, rhonchi or rales  Abd: soft, nontender, no hepatomegaly  Ext: no edema Musculoskeletal:  No deformities, BUE and BLE strength normal and equal Skin: warm and dry  Neuro:  CNs 2-12 intact, no focal abnormalities noted Psych:  Normal affect   EKG:  The EKG was personally reviewed and demonstrates:  nsr with RBBB Telemetry:  Telemetry was personally reviewed and demonstrates:  nsr Laboratory Data:  High Sensitivity Troponin:   Recent Labs  Lab 11/26/19 1112 11/26/19 1312 11/26/19 1816  TROPONINIHS 36* 37* 41*     Chemistry Recent Labs  Lab 11/26/19 1112 11/26/19 1112 11/26/19 1816 11/27/19 1038 11/28/19 0248  NA 140  --   --  139 139  K 4.1  --   --  6.0* 4.1  CL 100  --   --  100 100  CO2 29  --   --  24 25  GLUCOSE 199*  --   --  132* 126*  BUN 27*  --   --  34* 41*  CREATININE 6.92*   < > 7.33* 7.96* 9.36*  CALCIUM  9.8  --   --  9.3 9.5  GFRNONAA 7*   < > 6* 6* 5*  GFRAA 8*   < > 7* 7* 5*  ANIONGAP 11  --   --  15 14   < > = values in this interval not displayed.    Recent Labs  Lab 11/26/19 1112 11/27/19 1038  PROT 7.0 6.0*  ALBUMIN 3.7 3.3*  AST 19 41  ALT 15 14  ALKPHOS 99 72  BILITOT 0.5 1.3*   Hematology Recent Labs  Lab 11/26/19 1816 11/27/19 1038 11/28/19 0248  WBC 6.2 7.0 6.3  RBC 3.60* 3.55* 3.45*  HGB 12.1* 12.1* 11.3*  HCT 36.9* 36.6* 35.0*  MCV 102.5* 103.1* 101.4*  MCH 33.6 34.1* 32.8  MCHC 32.8 33.1 32.3  RDW 12.7 13.1 13.0  PLT 209 214 208   BNPNo results for input(s): BNP, PROBNP in the last 168 hours.  DDimer No results for input(s): DDIMER in the last 168 hours.   Radiology/Studies:  CT Head Wo Contrast  Result Date: 11/26/2019 CLINICAL DATA:  Mental status change EXAM: CT HEAD WITHOUT CONTRAST TECHNIQUE: Contiguous axial images were obtained from the base of the skull through the vertex without intravenous contrast. COMPARISON:  CT head 10/20/2017 FINDINGS: Brain: Cerebral ventricle sizes are concordant with the degree of cerebral volume loss. Patchy and confluent areas of decreased attenuation are noted throughout the deep and periventricular white matter of the cerebral hemispheres bilaterally, compatible with chronic microvascular ischemic disease. No evidence of large-territorial acute infarction. No parenchymal hemorrhage. No mass lesion. No extra-axial collection. No mass effect or midline shift. No hydrocephalus. Basilar cisterns are patent. Vascular: No hyperdense vessel or unexpected calcification. Skull: Interval increase in size of a 5.7 x 2.5 cm fluid density lesion within the subcutaneus soft tissues of the occipital scalp. The lesion has slightly thick walls and persistent slight heterogeneity within. This finding likely represents sebaceous cyst. Negative for fracture or focal lesion. Sinuses/Orbits: Paranasal sinuses and mastoid air cells are clear.  Status post bilateral lens replacement. Redemonstration of left glaucoma drainage device. Similar-appearing 1.2 cm fat density lesion lateral to the right orbit anterior to the zygomatic bone that may be surgical versus a benign finding (4:12). Otherwise the orbits are unremarkable. Other: Atherosclerotic calcifications are present within the cavernous internal carotid arteries and bilateral vertebral arteries. IMPRESSION: No acute intracranial abnormality. Electronically Signed   By: Iven Finn M.D.   On: 11/26/2019 15:34   DG Chest Port 1 View  Result Date: 11/26/2019 CLINICAL DATA:  Chest pain. EXAM: PORTABLE CHEST 1 VIEW COMPARISON:  03/25/2018 FINDINGS: Mild enlargement of the cardiac silhouette, similar to prior. Pulmonary vascular congestion. No evidence of pulmonary edema. No consolidation. No visible pleural effusions. No pneumothorax. The visualized skeletal structures are unremarkable. IMPRESSION: 1. Pulmonary vascular congestion without evidence of pulmonary edema. 2. Similar mild cardiomegaly. Electronically Signed   By: Margaretha Sheffield MD   On: 11/26/2019 11:50   CT Angio Chest/Abd/Pel for Dissection W and/or W/WO  Result Date: 11/26/2019 CLINICAL DATA:  Chest and back pain EXAM: CT ANGIOGRAPHY CHEST, ABDOMEN AND PELVIS  TECHNIQUE: Non-contrast CT of the chest was initially obtained. Multidetector CT imaging through the chest, abdomen and pelvis was performed using the standard protocol during bolus administration of intravenous contrast. Multiplanar reconstructed images and MIPs were obtained and reviewed to evaluate the vascular anatomy. CONTRAST:  163mL OMNIPAQUE IOHEXOL 350 MG/ML SOLN COMPARISON:  11/26/2019 FINDINGS: CTA CHEST FINDINGS Cardiovascular: The thoracic aorta is unremarkable without aneurysm or dissection. Moderate atherosclerosis of the aortic arch and at the origin of the great vessels. The heart is unremarkable without pericardial effusion. Calcification of the mitral  and aortic valves are noted. Moderate atherosclerosis of the coronary vessels. While not optimized for opacification of the pulmonary vasculature, there is sufficient contrast enhancement to exclude pulmonary emboli. Mediastinum/Nodes: No enlarged mediastinal, hilar, or axillary lymph nodes. Thyroid gland, trachea, and esophagus demonstrate no significant findings. Lungs/Pleura: No acute airspace disease, effusion, or pneumothorax. The central airways are patent. Musculoskeletal: No acute or destructive bony lesions. Reconstructed images demonstrate no additional findings. Review of the MIP images confirms the above findings. CTA ABDOMEN AND PELVIS FINDINGS VASCULAR Aorta: Normal caliber aorta without aneurysm, dissection, vasculitis or significant stenosis. Celiac: Patent without evidence of aneurysm, dissection, vasculitis or significant stenosis. SMA: Patent without evidence of aneurysm, dissection, vasculitis or significant stenosis. Renals: There is severe atherosclerosis of the bilateral renal arteries, left greater than right. Multifocal high-grade stenosis at the origin of the left renal artery. IMA: Patent without evidence of aneurysm, dissection, vasculitis or significant stenosis. Inflow: Extensive atherosclerosis without high-grade stenosis. Veins: No obvious venous abnormality within the limitations of this arterial phase study. Review of the MIP images confirms the above findings. NON-VASCULAR Hepatobiliary: The gallbladder is markedly distended with numerous small calcified gallstones layering dependently. No gallbladder wall thickening or surrounding inflammation to suggest cholecystitis. The liver is unremarkable. No biliary dilation. Pancreas: Unremarkable. No pancreatic ductal dilatation or surrounding inflammatory changes. Spleen: Normal in size without focal abnormality. Adrenals/Urinary Tract: 2.7 cm indeterminate left adrenal mass. Right adrenals wrist unremarkable. There is bilateral renal  cortical thinning and scarring. Otherwise normal enhancement. There is distension of the bilateral renal pelves and bilateral ureters, without clear cause for obstruction. This could be sequela of chronic bladder outlet obstruction. No filling defects within the urinary bladder. Stomach/Bowel: No bowel obstruction or ileus. There is an enhancing 2.3 cm mass within the lumen of the mid transverse colon, reference axial image 97 and coronal image 36, concerning for neoplasm. Colonoscopy recommended. The bladder is unremarkable. No bowel wall thickening or inflammatory change. Lymphatic: No pathologic adenopathy. Reproductive: Radiotherapy seeds are seen within the prostate. Prostate is mildly enlarged. Other: No free fluid or free gas. No abdominal wall hernia. Musculoskeletal: No acute or destructive bony lesions. Reconstructed images demonstrate no additional findings. Review of the MIP images confirms the above findings. IMPRESSION: 1. No evidence of thoracic or abdominal aortic aneurysm or dissection. No evidence of pulmonary embolus. 2. A 2.3 cm enhancing mass within the lumen of the mid transverse colon, concerning for neoplasm. Colonoscopy recommended for further evaluation. 3. Markedly distended gallbladder with numerous small calcified gallstones layering dependently. No evidence of cholecystitis. 4. 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 If further evaluation is warranted, nonemergent MRI could be performed. 5. Fullness of the bilateral renal pelves and ureters, without clear cause for obstruction. This could be sequela of chronic bladder outlet obstruction given the enlarged prostate. 6. Aortic Atherosclerosis (ICD10-I70.0). Electronically Signed   By: Randa Ngo M.D.   On: 11/26/2019  15:34   US Abdomen Limited RUQ  Result Date: 11/26/2019 CLINICAL DATA:  Abdominal pain EXAM: ULTRASOUND ABDOMEN LIMITED RIGHT UPPER QUADRANT COMPARISON:  None.  FINDINGS: Gallbladder: The gallbladder is filled with gallbladder sludge and multiple gallstones. The gallbladder is distended. There is a stone that appears to be wedged at the gallbladder neck. There is no gallbladder wall thickening. The sonographic Percell Miller sign is reported as negative. Common bile duct: Diameter: 3 mm Liver: There is a heterogeneous appearance of the liver parenchyma without evidence for a discrete hepatic mass. Portal vein is patent on color Doppler imaging with normal direction of blood flow towards the liver. Other: Incidentally noted is right-sided hydronephrosis. IMPRESSION: 1. Cholelithiasis without definite CT evidence for acute cholecystitis. However, the gallbladder is distended with a stone that appears to be wedged at the gallbladder neck. 2. Incidentally noted right-sided hydronephrosis. Electronically Signed   By: Constance Holster M.D.   On: 11/26/2019 17:39      Assessment and Plan:   1. 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 Preoperative eval - he may proceed with colonoscopy. Low risk 3. MR - his exam suggests at least moderate MR. We will obtain a 2D echo. However this would not prevent him from proceeding with colonoscopy 4. Elevated troponin - this is minimal and of no clinic significance.      For questions or updates, please contact [facility] Please consult [email or web address] for contact info under    Signed, Cristopher Peru, MD  11/28/2019 1:41 PM

## 2019-11-29 LAB — URINALYSIS, ROUTINE W REFLEX MICROSCOPIC
Bacteria, UA: NONE SEEN
Bilirubin Urine: NEGATIVE
Glucose, UA: 50 mg/dL — AB
Hgb urine dipstick: NEGATIVE
Ketones, ur: NEGATIVE mg/dL
Leukocytes,Ua: NEGATIVE
Nitrite: NEGATIVE
Protein, ur: 100 mg/dL — AB
Specific Gravity, Urine: 1.018 (ref 1.005–1.030)
pH: 7 (ref 5.0–8.0)

## 2019-11-29 LAB — ECHOCARDIOGRAM COMPLETE
AR max vel: 0.89 cm2
AV Area VTI: 1.06 cm2
AV Area mean vel: 0.97 cm2
AV Mean grad: 26 mmHg
AV Peak grad: 54.8 mmHg
Ao pk vel: 3.7 m/s
Area-P 1/2: 1.94 cm2
Height: 60 in
S' Lateral: 2.35 cm
Weight: 2599.66 oz

## 2019-11-29 LAB — GLUCOSE, CAPILLARY
Glucose-Capillary: 110 mg/dL — ABNORMAL HIGH (ref 70–99)
Glucose-Capillary: 169 mg/dL — ABNORMAL HIGH (ref 70–99)
Glucose-Capillary: 138 mg/dL — ABNORMAL HIGH (ref 70–99)
Glucose-Capillary: 146 mg/dL — ABNORMAL HIGH (ref 70–99)

## 2019-11-29 MED ORDER — HYPROMELLOSE (GONIOSCOPIC) 2.5 % OP SOLN
1.0000 [drp] | OPHTHALMIC | Status: DC | PRN
  Filled 2019-11-29: qty 15

## 2019-11-29 MED ORDER — BISACODYL 10 MG RE SUPP
10.0000 mg | Freq: Once | RECTAL | Status: AC
  Administered 2019-11-29: 10 mg via RECTAL
  Filled 2019-11-29: qty 1

## 2019-11-29 MED ORDER — DICLOFENAC SODIUM 1 % EX GEL
4.0000 g | Freq: Four times a day (QID) | CUTANEOUS | Status: DC | PRN
  Filled 2019-11-29: qty 100

## 2019-11-29 MED ORDER — DORZOLAMIDE HCL-TIMOLOL MAL 2-0.5 % OP SOLN
1.0000 [drp] | Freq: Two times a day (BID) | OPHTHALMIC | Status: DC
  Administered 2019-11-29 – 2019-12-03 (×8): 1 [drp] via OPHTHALMIC
  Filled 2019-11-29: qty 10

## 2019-11-29 MED ORDER — PEG 3350-KCL-NA BICARB-NACL 420 G PO SOLR
4000.0000 mL | Freq: Once | ORAL | Status: AC
  Administered 2019-11-29: 4000 mL via ORAL
  Filled 2019-11-29: qty 4000

## 2019-11-29 NOTE — Progress Notes (Addendum)
[facility] Progress Note   Dialysis Orders: TTS [place] 4 hr EDW 76 2 K 2 Ca 400/800 left upper AVF  no heparin no Fe no ESA hectorol 7 Recent labs: hgb 12 iPTH 300s Ca/P controlled - 2 Aurixya ac; very compliant with treatemtns   Assessment/Plan: 1. Chest pain- no more work up planned; incidental finding of colonic and adrenal mass.  Seen by cardiology, min ^troponin, no w/u needed.  2. Abd pain - PPI started, ^total bil noted, US showed GB distension with stone wedged at GB neck no acute cholecystitis. Per pmd.  3. Colon mass - for colonoscopy per GI 4. 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 Pt started HD Feb 2021, but no recent imaging in Epic. Have d/w pmd, will place foley and repeat the renal US tomorrow.  5. ESRD -  TTS HD. HD yest. Next HD Tuesday.  6. Hypertension/volume  - outpatient BP stable -lowering volume - new EDW at d/c 7. Anemia  - hgb 12.1 stable - no ESA/ IV Fe -  8. DM - per primary 9. Metabolic bone disease -  Continue hectorol- resume binders after colonsocopy - on 2 Aurixya ac 10. Nutrition - renal carb mod after GI eval - CL for now resume vits post procedure   Kelly Splinter, MD 11/29/2019, 11:52 AM      Subjective:   Seen in room, no c/o today in good spirits  Objective Vitals:   11/28/19 1030 11/28/19 1045 11/28/19 1948 11/29/19 0454  BP: (!) 143/68 (!) 155/71 137/72 132/74  Pulse: 84 84 73 74  Resp:  14 16 18   Temp:  98.9 F (37.2 C) 98 F (36.7 C) 98.4 F (36.9 C)  TempSrc:  Oral  Oral  SpO2:  98% 100% 99%  Weight:  73.7 kg  74.2 kg  Height:       Physical Exam General: supine on HD NAD Heart: RRR Lungs: no rales Abdomen: soft NT Extremities: no LE edema  Dialysis Access: left upper AVF +bruit   Additional Objective Labs: Basic Metabolic Panel: Recent Labs  Lab 11/26/19 1112 11/26/19 1112  11/26/19 1816 11/27/19 1038 11/28/19 0248  NA 140  --   --  139 139  K 4.1  --   --  6.0* 4.1  CL 100  --   --  100 100  CO2 29  --   --  24 25  GLUCOSE 199*  --   --  132* 126*  BUN 27*  --   --  34* 41*  CREATININE 6.92*   < > 7.33* 7.96* 9.36*  CALCIUM 9.8  --   --  9.3 9.5   < > = values in this interval not displayed.   Liver Function Tests: Recent Labs  Lab 11/26/19 1112 11/27/19 1038  AST 19 41  ALT 15 14  ALKPHOS 99 72  BILITOT 0.5 1.3*  PROT 7.0 6.0*  ALBUMIN 3.7 3.3*   Recent Labs  Lab 11/26/19 1112  LIPASE 29   CBC: Recent Labs  Lab 11/26/19 1112 11/26/19 1112 11/26/19 1816 11/27/19 1038 11/28/19 0248  WBC 7.6   < > 6.2 7.0 6.3  NEUTROABS 4.0  --   --   --   --   HGB 11.7*   < > 12.1* 12.1* 11.3*  HCT 36.8*   < > 36.9* 36.6* 35.0*  MCV 104.8*  --  102.5* 103.1* 101.4*  PLT 194   < > 209 214 208   < > = values in this interval not displayed.   Blood Culture    Component Value Date/Time   SDES URINE, RANDOM 06/05/2007 0039   SPECREQUEST NONE 06/05/2007 0039   CULT NO GROWTH 06/05/2007 0039   REPTSTATUS 06/06/2007 FINAL 06/05/2007 0039    Cardiac Enzymes: No results for input(s): CKTOTAL, CKMB, CKMBINDEX, TROPONINI in the last 168 hours. CBG: Recent Labs  Lab 11/28/19 0623 11/28/19 1214 11/28/19 1718 11/28/19 2106 11/29/19 0620  GLUCAP 119* 112* 197* 244* 110*   Iron Studies: No results for input(s): IRON, TIBC, TRANSFERRIN, FERRITIN in the last 72 hours. Lab Results  Component Value Date   INR 1.0 11/26/2019   INR 1.11 03/25/2018   Studies/Results: ECHOCARDIOGRAM COMPLETE  Result Date: 11/29/2019    ECHOCARDIOGRAM REPORT   Patient Name:   Elijah White Date of Exam: 11/28/2019 Medical Rec #:  [id number]       Height:       60.0 in Accession #:    [id number]      Weight:       162.5 lb Date of Birth:  Jul 03, 1940       BSA:          1.709 m Patient Age:    79 years        BP:           155/71 mmHg Patient Gender: M               HR:            76 bpm. Exam Location:  Inpatient Procedure: 2D Echo Indications:    chest pain 786.50  History:        Patient has prior history of Echocardiogram examinations, most                 recent 09/02/2017. End stage renal disease, Aortic Valve Disease;                 Risk Factors:Diabetes and Dyslipidemia.  Sonographer:    Johny Chess Referring Phys: [id number] Champ Mungo TAYLOR  Sonographer Comments: Image acquisition challenging due to respiratory motion. IMPRESSIONS  1. Left ventricular ejection fraction, by estimation, is 65 to 70%. The left ventricle has normal function. The left ventricle has no regional wall motion abnormalities. Left ventricular diastolic parameters are indeterminate.  2. Right ventricular systolic function is normal. The right ventricular size is normal. Tricuspid regurgitation signal is inadequate for assessing PA pressure.  3. The mitral valve is degenerative. Trivial mitral valve regurgitation. No evidence of mitral stenosis. Severe mitral annular calcification.  4. The aortic valve is calcified. There is severe calcifcation of the aortic valve. There is severe thickening of the aortic valve. Aortic valve regurgitation is trivial. 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  5. The inferior vena cava is normal in size with greater than 50% respiratory variability, suggesting right atrial pressure of 3 mmHg. FINDINGS  Left Ventricle: Left ventricular ejection fraction, by estimation, is 65 to 70%. The left ventricle has normal function. The left ventricle has no regional wall motion abnormalities. The left ventricular internal cavity size was normal in size. There is  no left ventricular hypertrophy. Left ventricular diastolic parameters are indeterminate. Right Ventricle: The right ventricular size is normal. No increase in right ventricular wall thickness. Right ventricular systolic function is normal. Tricuspid regurgitation signal is inadequate for assessing PA pressure. Left Atrium:  Left atrial size was normal in size. Right Atrium: Right atrial size was normal in size. Pericardium: There is no evidence of pericardial effusion. Mitral Valve: The mitral valve is degenerative in appearance. Severe mitral annular calcification. Trivial mitral valve regurgitation. No evidence of mitral valve stenosis. Tricuspid Valve: The tricuspid valve is grossly normal. Tricuspid valve regurgitation is not demonstrated. Aortic Valve: The aortic valve is calcified. There is severe calcifcation of the aortic valve. There is severe thickening of the aortic valve. Aortic valve regurgitation is trivial. Moderate to severe aortic stenosis is present. Aortic valve mean gradient measures 26.0 mmHg. Aortic valve peak gradient measures 54.8 mmHg. Aortic valve area, by VTI measures 1.06 cm. Pulmonic Valve: The pulmonic valve was not well visualized. Pulmonic valve regurgitation is not visualized. Aorta: The aortic root is normal in size and structure. Venous: The inferior vena cava is normal in size with greater than 50% respiratory variability, suggesting right atrial pressure of 3 mmHg. IAS/Shunts: No atrial level shunt detected by color flow Doppler.  LEFT VENTRICLE PLAX 2D LVIDd:         3.90 cm  Diastology LVIDs:         2.35 cm  LV e' medial:    4.57 cm/s LV PW:         1.00 cm  LV E/e' medial:  15.5 LV IVS:        0.90 cm  LV e' lateral:   8.05 cm/s LVOT diam:     2.08 cm  LV E/e' lateral: 8.8 LV SV:         76 LV SV Index:   44 LVOT Area:     3.40 cm  RIGHT VENTRICLE             IVC RV S prime:     11.00 cm/s  IVC diam: 1.25 cm TAPSE (M-mode): 2.0 cm LEFT ATRIUM             Index       RIGHT ATRIUM          Index LA diam:        3.40 cm 1.99 cm/m  RA Area:     9.48 cm LA Vol (A2C):   53.7 ml 31.42 ml/m RA Volume:   18.10 ml 10.59 ml/m LA Vol (A4C):   30.6 ml 17.91 ml/m LA Biplane Vol: 41.8 ml 24.46 ml/m  AORTIC VALVE AV Area (Vmax):    0.89 cm AV Area (Vmean):   0.97 cm AV Area (VTI):     1.06 cm AV  Vmax:           370.00 cm/s AV Vmean:          231.000 cm/s AV VTI:            0.716 m AV Peak Grad:      54.8 mmHg AV Mean Grad:      26.0 mmHg LVOT Vmax:         96.71 cm/s LVOT Vmean:        66.068 cm/s LVOT VTI:          0.223 m LVOT/AV VTI ratio: 0.31  AORTA Ao Root diam: 3.30 cm MITRAL VALVE MV Area (PHT): 1.94 cm     SHUNTS MV Decel Time: 391 msec     Systemic VTI:  0.22 m MV E velocity: 71.00 cm/s   Systemic Diam: 2.08 cm MV A velocity: 140.00 cm/s MV E/A ratio:  0.51 Mihai Croitoru MD Electronically signed by  Mihai Croitoru MD Signature Date/Time: 11/29/2019/8:09:10 AM    Final    Medications:  . apraclonidine  1 drop Right Eye TID  . bisacodyl  10 mg Rectal Once  . Chlorhexidine Gluconate Cloth  6 each Topical Q0600  . doxercalciferol  7 mcg Intravenous Q T,Th,Sa-HD  . gabapentin  100 mg Oral QHS  . heparin  5,000 Units Subcutaneous Q8H  . insulin aspart  0-5 Units Subcutaneous QHS  . insulin aspart  0-6 Units Subcutaneous TID WC  . latanoprost  1 drop Right Eye QHS  . pantoprazole  40 mg Oral Daily  . pilocarpine  1 drop Right Eye QID  . polyethylene glycol-electrolytes  4,000 mL Oral Once

## 2019-11-29 NOTE — Progress Notes (Signed)
PROGRESS NOTE  Elijah White ZGY:[id number] DOB: 1940/05/01 DOA: 11/26/2019 PCP: [name], MD   LOS: 3 days   Brief narrative: As per HPI,  Elijah White is a 79 y.o. male with medical history significant of ESRD on dialysis (TTS), hypertension, hyperlipidemia, diabetes mellitus, GERD, legally blind in left eye, chronic diastolic CHF presented to the emergency department for evaluation of chest pain/epigastric pain, shortness of breath and generalized weakness for 1 day.  Patient's daughter was driving him to dialysis unit when he developed chest pain so he was brought into the hospital he also had shortness of breath.  Patient had also lost 10 to 12 pounds in last 73-month since he has been started on dialysis.  Patient daughter stated that he lives alone at home and he is independent on daily life activities.  She is concerned as patient is legally blind from left eye and head recent fall with bruises on his arm and is concerned about safety at home.  Upon arrival to ED: Patient's vital signs stable, afebrile, no leukocytosis, maintaining oxygen saturation on room air, initial troponin 36 trended up to 37, EKG: No acute changes, COVID-19 negative.  CT head negative for acute findings, lipase: WNL.  CTA chest: Negative for PE or aortic dissection.  CT was concerning for 2.3 cm in size transverse colon mass, 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.  Chest x-ray shows pulmonary vascular congestion without pulmonary edema, mild cardiomegaly.  EDP consulted nephrology.  Patient was then admitted to hospital for further work-up and treatment.  Assessment/Plan:  Principal Problem:   Chest pain Active Problems:   Type 2 diabetes mellitus with renal manifestations (HCC)   Essential hypertension   GERD (gastroesophageal reflux disease)   HLD (hyperlipidemia)   ESRD (end stage renal disease) (HCC)   Macrocytic anemia   Adrenal mass (HCC)   Colonic mass  Chest pain/epigastric pain shortness of  breath. Chest x-ray showed some pulmonary vascular congestion, mild cardiomegaly.  There is a possibility of biliary colic atypical pain.  Abdominal utrasound shows possible stone in the gallbladder. Continue PPI.  Troponin borderline elevated but in the context of ESRD.  EKG was unremarkable.  Cardiology on board.  Cholelithiasis without cholecystitis.  Possibility of biliary colic.  Possible conservative treatment at this time.  Could try ursedeoxycholic acid if surgery not wanted.   ESRD on hemodialysis: Tuesday, Thursday Saturday schedule as outpatient.  Nephrology on board for hemodialysis.  Bilateral mild hydronephrosis, could be prostate enlargement related. Will place foley, check renal ultrasound in am.  Transverse colon mass: 2.3 cm in size.  Incidental finding. GI on board for colonoscopy plan.  Patient did have colonoscopy in 2009 and polyps were removed.   Essential hypertension: Stable Continue Coreg, hydralazine, Imdur.    Diabetes mellitus type II: Continue sliding scale insulin, Accu-Cheks, diabetic diet.  Last POC glucose of 110  GERD: Continue PPI  Diabetic neuropathy: Continue gabapentin  Chronic diastolic CHF: Continue Imdur, statin, Coreg. 2D echocardiogram from 09/02/2017 which showed ejection fraction of 65 to 70% and grade 1 diastolic dysfunction.  Continue intake and output charting, Daily weights.  Hyperlipidemia: Continue statin  Macrocytic anemia: TSH of 0.8.  Vitamin B-12 level elevated.  Folate level within normal limits.  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  Incidental finding.  GI has been consulted for colonic mass.  Further work-up of adrenal mass if desired after GI evaluation.   Distended gallbladder with small gallstones: No evidence of cholecystitis on CT scan of the  chest.  Right upper quadrant ultrasound shows cholelithiasis without acute evidence for cholecystitis.  There is possibility of stone at the gallbladder neck.  Right-sided  hydronephrosis was also noted.  Lipase within normal range.  GI on board.   Debility, deconditioning, legal blindness.  Recent fall at home.  History of glaucoma. Patient lives alone.  Safety concern due to recent fall at home.  Patient's daughter wishes skilled nursing facility on discharge.  Transition of care has been consulted.  DVT prophylaxis: heparin injection 5,000 Units Start: 11/26/19 1600 SCDs Start: 11/26/19 1554     Code Status: DNR  Family Communication: none today. I spoke with the patient's daughter on the phone yesterday  Status is: Inpatient  Remains inpatient appropriate because:Inpatient level of care appropriate due to severity of illness and Colonoscopic evaluation.,  Unsafe disposition, likely need a skilled nursing facility placement.   Dispo: The patient is from: Home              Anticipated d/c is to: SNF              Anticipated d/c date is: 2 days              Patient currently is not medically stable to d/c.  Consultants:  GI  Nephrology   Procedures:  Hemodialysis  Antibiotics:  . None  Anti-infectives (From admission, onward)   None     Subjective: Today, patient was seen and examined at bedside.  Denies any nausea vomiting abdominal pain has mild shortness of breath.   Objective: Vitals:   11/28/19 1948 11/29/19 0454  BP: 137/72 132/74  Pulse: 73 74  Resp: 16 18  Temp: 98 F (36.7 C) 98.4 F (36.9 C)  SpO2: 100% 99%    Intake/Output Summary (Last 24 hours) at 11/29/2019 0716 Last data filed at 11/29/2019 0500 Gross per 24 hour  Intake --  Output 2400 ml  Net -2400 ml   Filed Weights   11/28/19 0705 11/28/19 1045 11/29/19 0454  Weight: 75.2 kg 73.7 kg 74.2 kg   Body mass index is 31.95 kg/m.   Physical Exam:  GENERAL: Patient is alert awake and communicative. Not in obvious distress. HENT: Mild congestion of the eyes.  Oral mucosa is moist. Legally blind on the left eye. NECK: is supple, no gross swelling  noted. CHEST: Clear to auscultation. No crackles or wheezes.  Diminished breath sounds bilaterally. CVS: S1 and S2 heard, systolic murmur noted.  Regular rate and rhythm.  ABDOMEN: Soft, non-tender, bowel sounds are present. EXTREMITIES: No edema.  Left upper extremity AV fistula in place. Right upper extremity with bruise. CNS: Cranial nerves are intact. No focal motor deficits.  SKIN: warm and dry without rashes.  Right upper extremity bruise  Data Review: I have personally reviewed the following laboratory data and studies,  CBC: Recent Labs  Lab 11/26/19 1112 11/26/19 1816 11/27/19 1038 11/28/19 0248  WBC 7.6 6.2 7.0 6.3  NEUTROABS 4.0  --   --   --   HGB 11.7* 12.1* 12.1* 11.3*  HCT 36.8* 36.9* 36.6* 35.0*  MCV 104.8* 102.5* 103.1* 101.4*  PLT 194 209 214 170   Basic Metabolic Panel: Recent Labs  Lab 11/26/19 1112 11/26/19 1816 11/27/19 1038 11/28/19 0248  NA 140  --  139 139  K 4.1  --  6.0* 4.1  CL 100  --  100 100  CO2 29  --  24 25  GLUCOSE 199*  --  132* 126*  BUN  27*  --  34* 41*  CREATININE 6.92* 7.33* 7.96* 9.36*  CALCIUM 9.8  --  9.3 9.5  MG  --  2.4  --  2.3   Liver Function Tests: Recent Labs  Lab 11/26/19 1112 11/27/19 1038  AST 19 41  ALT 15 14  ALKPHOS 99 72  BILITOT 0.5 1.3*  PROT 7.0 6.0*  ALBUMIN 3.7 3.3*   Recent Labs  Lab 11/26/19 1112  LIPASE 29   No results for input(s): AMMONIA in the last 168 hours. Cardiac Enzymes: No results for input(s): CKTOTAL, CKMB, CKMBINDEX, TROPONINI in the last 168 hours. BNP (last 3 results) No results for input(s): BNP in the last 8760 hours.  ProBNP (last 3 results) No results for input(s): PROBNP in the last 8760 hours.  CBG: Recent Labs  Lab 11/28/19 0623 11/28/19 1214 11/28/19 1718 11/28/19 2106 11/29/19 0620  GLUCAP 119* 112* 197* 244* 110*   Recent Results (from the past 240 hour(s))  Respiratory Panel by RT PCR (Flu A&B, Covid) - Nasopharyngeal Swab     Status: None    Collection Time: 11/26/19 11:12 AM   Specimen: Nasopharyngeal Swab  Result Value Ref Range Status   SARS Coronavirus 2 by RT PCR NEGATIVE NEGATIVE Final    Comment: (NOTE) SARS-CoV-2 target nucleic acids are NOT DETECTED.  The SARS-CoV-2 RNA is generally detectable in upper respiratoy specimens during the acute phase of infection. The lowest concentration of SARS-CoV-2 viral copies this assay can detect is 131 copies/mL. A negative result does not preclude SARS-Cov-2 infection and should not be used as the sole basis for treatment or other patient management decisions. A negative result may occur with  improper specimen collection/handling, submission of specimen other than nasopharyngeal swab, presence of viral mutation(s) within the areas targeted by this assay, and inadequate number of viral copies (<131 copies/mL). A negative result must be combined with clinical observations, patient history, and epidemiological information. The expected result is Negative.  Fact Sheet for Patients:  PinkCheek.be  Fact Sheet for Healthcare Providers:  GravelBags.it  This test is no t yet approved or cleared by the [place] FDA and  has been authorized for detection and/or diagnosis of SARS-CoV-2 by FDA under an Emergency Use Authorization (EUA). This EUA will remain  in effect (meaning this test can be used) for the duration of the COVID-19 declaration under Section 564(b)(1) of the Act, 21 U.S.C. section 360bbb-3(b)(1), unless the authorization is terminated or revoked sooner.     Influenza A by PCR NEGATIVE NEGATIVE Final   Influenza B by PCR NEGATIVE NEGATIVE Final    Comment: (NOTE) The Xpert Xpress SARS-CoV-2/FLU/RSV assay is intended as an aid in  the diagnosis of influenza from Nasopharyngeal swab specimens and  should not be used as a sole basis for treatment. Nasal washings and  aspirates are unacceptable for Xpert  Xpress SARS-CoV-2/FLU/RSV  testing.  Fact Sheet for Patients: PinkCheek.be  Fact Sheet for Healthcare Providers: GravelBags.it  This test is not yet approved or cleared by the [place] FDA and  has been authorized for detection and/or diagnosis of SARS-CoV-2 by  FDA under an Emergency Use Authorization (EUA). This EUA will remain  in effect (meaning this test can be used) for the duration of the  Covid-19 declaration under Section 564(b)(1) of the Act, 21  U.S.C. section 360bbb-3(b)(1), unless the authorization is  terminated or revoked. Performed at [facility], [place] [address] [place], [place] [address]   MRSA PCR Screening  Status: None   Collection Time: 11/26/19  9:49 PM   Specimen: Nasal Mucosa; Nasopharyngeal  Result Value Ref Range Status   MRSA by PCR NEGATIVE NEGATIVE Final    Comment:        The GeneXpert MRSA Assay (FDA approved for NASAL specimens only), is one component of a comprehensive MRSA colonization surveillance program. It is not intended to diagnose MRSA infection nor to guide or monitor treatment for MRSA infections. Performed at [facility], [place] [address] [place], [place] [address]      Studies: No results found.    Flora Lipps, MD  [facility] 11/29/2019

## 2019-11-29 NOTE — Progress Notes (Signed)
Cardiology consultation noted and appreciated.  Will plan for clear liquid diet today, bowel preparation, and colonoscopy tomorrow.

## 2019-11-29 NOTE — TOC Initial Note (Signed)
Transition of Care [facility]) - Initial/Assessment Note    Patient Details  Name: Elijah White MRN: [id number] Date of Birth: 08-01-1940  [facility]) CM/SW Contact:    Andria Frames, [place] Work Phone Number: 11/29/2019, 3:08 PM  Clinical Narrative:           MSW Intern met with patient and his daughter, [name] (also present in room) for SNF work-up.  Patient sleeping initially and MSW Intern provided patient's daughter with Medicare.gov facilities list. Patient's daughter advised of process for rehab placement; she expressed concern that patient may have other issues going on medically that could change the rehab recommendation.  Patient's daughter advised patient is very familiar with his surroundings at home; his apt is small and he knows where everything is, however she is concerned he is a fall risk and inquired about assistance with applying for Medicaid (possibly for LTC).  Patient awoke during visit and brief discussion held regarding SNF placement.  Patient is concerned about cost after the 20 days.    At this time, patient and daughter have not made a decision for SNF.  TOC team will continue to follow for discharge supports. SNF referral has not been started per their request.                Patient Goals and CMS Choice        Expected Discharge Plan and Services                                                Prior Living Arrangements/Services                       Activities of Daily Living Home Assistive Devices/Equipment: Cane (specify quad or straight) ADL Screening (condition at time of admission) Patient's cognitive ability adequate to safely complete daily activities?: Yes Is the patient deaf or have difficulty hearing?: No Does the patient have difficulty seeing, even when wearing glasses/contacts?: Yes Does the patient have difficulty concentrating, remembering, or making decisions?: No Patient able to express  need for assistance with ADLs?: No Does the patient have difficulty dressing or bathing?: No Independently performs ADLs?: Yes (appropriate for developmental age) Does the patient have difficulty walking or climbing stairs?: No Weakness of Legs: None Weakness of Arms/Hands: None  Permission Sought/Granted                  Emotional Assessment              Admission diagnosis:  Precordial pain [R07.2] ESRD on dialysis ([place]) [N18.6, Z99.2] Generalized weakness [R53.1] Abdominal pain [R10.9] Chest pain [R07.9] Patient Active Problem List   Diagnosis Date Noted  . ESRD (end stage renal disease) ([place]) 11/26/2019  . Macrocytic anemia 11/26/2019  . Adrenal mass ([place]) 11/26/2019  . 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 Chest pain 11/26/2019  . ARF (acute renal failure) ([place]) 03/25/2018  . Acute on chronic diastolic CHF (congestive heart failure) ([place]) 03/25/2018  . 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 Vision loss of right eye 11/11/2017  . Acute decompensated heart failure ([place]) 09/01/2017  . Bilateral pseudophakia 07/11/2017  . 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 GERD (gastroesophageal reflux disease) 09/10/2016  . HLD (hyperlipidemia) 09/10/2016  . Dizziness 08/13/2016  . Renal failure (ARF), acute on chronic (  [place]) 08/13/2016  . Essential hypertension 08/13/2016  . Cataract, nuclear 10/29/2012  . 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  . Type 2 diabetes mellitus with renal manifestations ([place]) 10/22/2006  . BENIGN PROSTATIC HYPERTROPHY 10/22/2006  . COLONIC POLYPS, HX OF 10/22/2006   PCP:  [name], MD Pharmacy:   [facility] [place], [place] - [address] [place] [address] Phone: [phone] Fax: [phone]     Social Determinants of Health (SDOH) Interventions    Readmission Risk Interventions No flowsheet data found.

## 2019-11-29 NOTE — Progress Notes (Signed)
Progress Note  Patient Name: Elijah White Date of Encounter: 11/29/2019  Primary Cardiologist: No primary care provider on file.   Subjective   Many questions about colonoscopy. "Can you talk to my daughter?"  Inpatient Medications    Scheduled Meds: . apraclonidine  1 drop Right Eye TID  . Chlorhexidine Gluconate Cloth  6 each Topical Q0600  . doxercalciferol  7 mcg Intravenous Q T,Th,Sa-HD  . gabapentin  100 mg Oral QHS  . heparin  5,000 Units Subcutaneous Q8H  . insulin aspart  0-5 Units Subcutaneous QHS  . insulin aspart  0-6 Units Subcutaneous TID WC  . latanoprost  1 drop Right Eye QHS  . pantoprazole  40 mg Oral Daily  . pilocarpine  1 drop Right Eye QID   Continuous Infusions:  PRN Meds: acetaminophen **OR** acetaminophen, hydrALAZINE, hydrocortisone cream, HYDROmorphone (DILAUDID) injection, ondansetron **OR** ondansetron (ZOFRAN) IV   Vital Signs    Vitals:   11/28/19 1030 11/28/19 1045 11/28/19 1948 11/29/19 0454  BP: (!) 143/68 (!) 155/71 137/72 132/74  Pulse: 84 84 73 74  Resp:  14 16 18   Temp:  98.9 F (37.2 C) 98 F (36.7 C) 98.4 F (36.9 C)  TempSrc:  Oral  Oral  SpO2:  98% 100% 99%  Weight:  73.7 kg  74.2 kg  Height:        Intake/Output Summary (Last 24 hours) at 11/29/2019 0941 Last data filed at 11/29/2019 0900 Gross per 24 hour  Intake 240 ml  Output 2400 ml  Net -2160 ml   Filed Weights   11/28/19 0705 11/28/19 1045 11/29/19 0454  Weight: 75.2 kg 73.7 kg 74.2 kg    Telemetry    nsr - Personally Reviewed  ECG    none - Personally Reviewed  Physical Exam   GEN: No acute distress.   Neck: No JVD Cardiac: RRR, 2/6 systolic murmurs, rubs, or gallops.  Respiratory: Clear to auscultation bilaterally. GI: Soft, nontender, non-distended  MS: No edema; No deformity. Neuro:  Nonfocal  Psych: Normal affect   Labs    Chemistry Recent Labs  Lab 11/26/19 1112 11/26/19 1112 11/26/19 1816 11/27/19 1038 11/28/19 0248  NA  140  --   --  139 139  K 4.1  --   --  6.0* 4.1  CL 100  --   --  100 100  CO2 29  --   --  24 25  GLUCOSE 199*  --   --  132* 126*  BUN 27*  --   --  34* 41*  CREATININE 6.92*   < > 7.33* 7.96* 9.36*  CALCIUM 9.8  --   --  9.3 9.5  PROT 7.0  --   --  6.0*  --   ALBUMIN 3.7  --   --  3.3*  --   AST 19  --   --  41  --   ALT 15  --   --  14  --   ALKPHOS 99  --   --  72  --   BILITOT 0.5  --   --  1.3*  --   GFRNONAA 7*   < > 6* 6* 5*  GFRAA 8*   < > 7* 7* 5*  ANIONGAP 11  --   --  15 14   < > = values in this interval not displayed.     Hematology Recent Labs  Lab 11/26/19 1816 11/27/19 1038 11/28/19 0248  WBC 6.2 7.0 6.3  RBC 3.60* 3.55* 3.45*  HGB 12.1* 12.1* 11.3*  HCT 36.9* 36.6* 35.0*  MCV 102.5* 103.1* 101.4*  MCH 33.6 34.1* 32.8  MCHC 32.8 33.1 32.3  RDW 12.7 13.1 13.0  PLT 209 214 208    Cardiac EnzymesNo results for input(s): TROPONINI in the last 168 hours. No results for input(s): TROPIPOC in the last 168 hours.   BNPNo results for input(s): BNP, PROBNP in the last 168 hours.   DDimer No results for input(s): DDIMER in the last 168 hours.   Radiology    ECHOCARDIOGRAM COMPLETE  Result Date: 11/29/2019    ECHOCARDIOGRAM REPORT   Patient Name:   Elijah White Date of Exam: 11/28/2019 Medical Rec #:  [id number]       Height:       60.0 in Accession #:    [id number]      Weight:       162.5 lb Date of Birth:  Jul 20, 1940       BSA:          1.709 m Patient Age:    79 years        BP:           155/71 mmHg Patient Gender: M               HR:           76 bpm. Exam Location:  Inpatient Procedure: 2D Echo Indications:    chest pain 786.50  History:        Patient has prior history of Echocardiogram examinations, most                 recent 09/02/2017. End stage renal disease, Aortic Valve Disease;                 Risk Factors:Diabetes and Dyslipidemia.  Sonographer:    Johny Chess Referring Phys: [id number] Champ Mungo Moselle Rister  Sonographer Comments: Image acquisition  challenging due to respiratory motion. IMPRESSIONS  1. Left ventricular ejection fraction, by estimation, is 65 to 70%. The left ventricle has normal function. The left ventricle has no regional wall motion abnormalities. Left ventricular diastolic parameters are indeterminate.  2. Right ventricular systolic function is normal. The right ventricular size is normal. Tricuspid regurgitation signal is inadequate for assessing PA pressure.  3. The mitral valve is degenerative. Trivial mitral valve regurgitation. No evidence of mitral stenosis. Severe mitral annular calcification.  4. The aortic valve is calcified. There is severe calcifcation of the aortic valve. There is severe thickening of the aortic valve. Aortic valve regurgitation is trivial. 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  5. The inferior vena cava is normal in size with greater than 50% respiratory variability, suggesting right atrial pressure of 3 mmHg. FINDINGS  Left Ventricle: Left ventricular ejection fraction, by estimation, is 65 to 70%. The left ventricle has normal function. The left ventricle has no regional wall motion abnormalities. The left ventricular internal cavity size was normal in size. There is  no left ventricular hypertrophy. Left ventricular diastolic parameters are indeterminate. Right Ventricle: The right ventricular size is normal. No increase in right ventricular wall thickness. Right ventricular systolic function is normal. Tricuspid regurgitation signal is inadequate for assessing PA pressure. Left Atrium: Left atrial size was normal in size. Right Atrium: Right atrial size was normal in size. Pericardium: There is no evidence of pericardial effusion. Mitral Valve: The mitral valve is degenerative in appearance. Severe mitral annular calcification. Trivial mitral valve regurgitation. No evidence  of mitral valve stenosis. Tricuspid Valve: The tricuspid valve is grossly normal. Tricuspid valve regurgitation is not  demonstrated. Aortic Valve: The aortic valve is calcified. There is severe calcifcation of the aortic valve. There is severe thickening of the aortic valve. Aortic valve regurgitation is trivial. Moderate to severe aortic stenosis is present. Aortic valve mean gradient measures 26.0 mmHg. Aortic valve peak gradient measures 54.8 mmHg. Aortic valve area, by VTI measures 1.06 cm. Pulmonic Valve: The pulmonic valve was not well visualized. Pulmonic valve regurgitation is not visualized. Aorta: The aortic root is normal in size and structure. Venous: The inferior vena cava is normal in size with greater than 50% respiratory variability, suggesting right atrial pressure of 3 mmHg. IAS/Shunts: No atrial level shunt detected by color flow Doppler.  LEFT VENTRICLE PLAX 2D LVIDd:         3.90 cm  Diastology LVIDs:         2.35 cm  LV e' medial:    4.57 cm/s LV PW:         1.00 cm  LV E/e' medial:  15.5 LV IVS:        0.90 cm  LV e' lateral:   8.05 cm/s LVOT diam:     2.08 cm  LV E/e' lateral: 8.8 LV SV:         76 LV SV Index:   44 LVOT Area:     3.40 cm  RIGHT VENTRICLE             IVC RV S prime:     11.00 cm/s  IVC diam: 1.25 cm TAPSE (M-mode): 2.0 cm LEFT ATRIUM             Index       RIGHT ATRIUM          Index LA diam:        3.40 cm 1.99 cm/m  RA Area:     9.48 cm LA Vol (A2C):   53.7 ml 31.42 ml/m RA Volume:   18.10 ml 10.59 ml/m LA Vol (A4C):   30.6 ml 17.91 ml/m LA Biplane Vol: 41.8 ml 24.46 ml/m  AORTIC VALVE AV Area (Vmax):    0.89 cm AV Area (Vmean):   0.97 cm AV Area (VTI):     1.06 cm AV Vmax:           370.00 cm/s AV Vmean:          231.000 cm/s AV VTI:            0.716 m AV Peak Grad:      54.8 mmHg AV Mean Grad:      26.0 mmHg LVOT Vmax:         96.71 cm/s LVOT Vmean:        66.068 cm/s LVOT VTI:          0.223 m LVOT/AV VTI ratio: 0.31  AORTA Ao Root diam: 3.30 cm MITRAL VALVE MV Area (PHT): 1.94 cm     SHUNTS MV Decel Time: 391 msec     Systemic VTI:  0.22 m MV E velocity: 71.00 cm/s    Systemic Diam: 2.08 cm MV A velocity: 140.00 cm/s MV E/A ratio:  0.51 Mihai Croitoru MD Electronically signed by Sanda Klein MD Signature Date/Time: 11/29/2019/8:09:10 AM    Final     Cardiac Studies   2D echo as above  Patient Profile     79 y.o. male admitted with anemia and multiple GI complaints as well  as chest pain.  Assessment & Plan    1. Preoperative eval for colonoscopy - echo reviewed. He has mod-severe AS but will be ok to proceed with colonoscopy. 2. ESRD - he appears to have undergone HD yesterday. 3. Epigastric pain - note results of ultrasound.  4. AS - not yet surgical. He will need another echo in a year.     For questions or updates, please contact [place] Please consult [email or web address] for contact info under Cardiology/STEMI.      Signed, Cristopher Peru, MD  11/29/2019, 9:41 AM  Patient ID: Elijah White, male   DOB: [birth year], 79 y.o.   MRN: [id number]

## 2019-11-30 ENCOUNTER — Inpatient Hospital Stay (HOSPITAL_COMMUNITY): Payer: Medicare Other

## 2019-11-30 ENCOUNTER — Encounter (HOSPITAL_COMMUNITY): Payer: Self-pay | Admitting: Internal Medicine

## 2019-11-30 ENCOUNTER — Encounter (HOSPITAL_COMMUNITY)
Admission: EM | Disposition: A | Discharge status: Home / Self Care | Payer: Self-pay | Source: Ambulatory Visit | Attending: Internal Medicine

## 2019-11-30 ENCOUNTER — Ambulatory Visit: Payer: Medicare Other

## 2019-11-30 ENCOUNTER — Inpatient Hospital Stay (HOSPITAL_COMMUNITY): Payer: Medicare Other | Admitting: Certified Registered Nurse Anesthetist

## 2019-11-30 DIAGNOSIS — N186 End stage renal disease: Secondary | ICD-10-CM

## 2019-11-30 HISTORY — PX: HEMOSTASIS CLIP PLACEMENT: SHX6857

## 2019-11-30 HISTORY — PX: SUBMUCOSAL TATTOO INJECTION: SHX6856

## 2019-11-30 HISTORY — PX: POLYPECTOMY: SHX5525

## 2019-11-30 HISTORY — PX: COLONOSCOPY WITH PROPOFOL: SHX5780

## 2019-11-30 LAB — BASIC METABOLIC PANEL
Anion gap: 18 — ABNORMAL HIGH (ref 5–15)
BUN: 31 mg/dL — ABNORMAL HIGH (ref 8–23)
CO2: 23 mmol/L (ref 22–32)
Calcium: 10.3 mg/dL (ref 8.9–10.3)
Chloride: 96 mmol/L — ABNORMAL LOW (ref 98–111)
Creatinine, Ser: 7.64 mg/dL — ABNORMAL HIGH (ref 0.61–1.24)
GFR calc Af Amer: 7 mL/min — ABNORMAL LOW (ref >60)
GFR calc non Af Amer: 6 mL/min — ABNORMAL LOW (ref >60)
Glucose, Bld: 119 mg/dL — ABNORMAL HIGH (ref 70–99)
Potassium: 4 mmol/L (ref 3.5–5.1)
Sodium: 137 mmol/L (ref 135–145)

## 2019-11-30 LAB — CBC
HCT: 40.8 % (ref 39.0–52.0)
Hemoglobin: 13.5 g/dL (ref 13.0–17.0)
MCH: 33.9 pg (ref 26.0–34.0)
MCHC: 33.1 g/dL (ref 30.0–36.0)
MCV: 102.5 fL — ABNORMAL HIGH (ref 80.0–100.0)
Platelets: 195 10*3/uL (ref 150–400)
RBC: 3.98 MIL/uL — ABNORMAL LOW (ref 4.22–5.81)
RDW: 12.7 % (ref 11.5–15.5)
WBC: 5.6 10*3/uL (ref 4.0–10.5)
nRBC: 0 % (ref 0.0–0.2)

## 2019-11-30 LAB — GLUCOSE, CAPILLARY
Glucose-Capillary: 103 mg/dL — ABNORMAL HIGH (ref 70–99)
Glucose-Capillary: 110 mg/dL — ABNORMAL HIGH (ref 70–99)
Glucose-Capillary: 187 mg/dL — ABNORMAL HIGH (ref 70–99)
Glucose-Capillary: 189 mg/dL — ABNORMAL HIGH (ref 70–99)

## 2019-11-30 SURGERY — COLONOSCOPY WITH PROPOFOL
Anesthesia: Monitor Anesthesia Care

## 2019-11-30 MED ORDER — TAMSULOSIN HCL 0.4 MG PO CAPS
0.4000 mg | ORAL_CAPSULE | Freq: Every day | ORAL | Status: DC
  Administered 2019-11-30 – 2019-12-03 (×4): 0.4 mg via ORAL
  Filled 2019-11-30 (×4): qty 1

## 2019-11-30 MED ORDER — EPHEDRINE SULFATE-NACL 50-0.9 MG/10ML-% IV SOSY
PREFILLED_SYRINGE | INTRAVENOUS | Status: DC | PRN
  Administered 2019-11-30 (×2): 10 mg via INTRAVENOUS
  Administered 2019-11-30 (×2): 5 mg via INTRAVENOUS

## 2019-11-30 MED ORDER — ACETAMINOPHEN 500 MG PO TABS: 1000.0000 mg | ORAL_TABLET | Freq: Four times a day (QID) | ORAL | Status: DC | PRN

## 2019-11-30 MED ORDER — RENA-VITE PO TABS
1.0000 | ORAL_TABLET | Freq: Every day | ORAL | Status: DC
  Administered 2019-11-30 – 2019-12-02 (×3): 1 via ORAL
  Filled 2019-11-30 (×3): qty 1

## 2019-11-30 MED ORDER — CHLORHEXIDINE GLUCONATE CLOTH 2 % EX PADS
6.0000 | MEDICATED_PAD | Freq: Every day | CUTANEOUS | Status: DC
  Administered 2019-12-01 – 2019-12-02 (×2): 6 via TOPICAL

## 2019-11-30 MED ORDER — CARVEDILOL 3.125 MG PO TABS
3.1250 mg | ORAL_TABLET | Freq: Two times a day (BID) | ORAL | Status: DC
  Administered 2019-11-30 – 2019-12-03 (×5): 3.125 mg via ORAL
  Filled 2019-11-30 (×5): qty 1

## 2019-11-30 MED ORDER — ISOSORBIDE MONONITRATE ER 30 MG PO TB24
30.0000 mg | ORAL_TABLET | Freq: Every day | ORAL | Status: DC
  Administered 2019-11-30 – 2019-12-02 (×3): 30 mg via ORAL
  Filled 2019-11-30 (×4): qty 1

## 2019-11-30 MED ORDER — PHENYLEPHRINE 40 MCG/ML (10ML) SYRINGE FOR IV PUSH (FOR BLOOD PRESSURE SUPPORT)
PREFILLED_SYRINGE | INTRAVENOUS | Status: DC | PRN
  Administered 2019-11-30: 40 ug via INTRAVENOUS
  Administered 2019-11-30: 80 ug via INTRAVENOUS
  Administered 2019-11-30: 120 ug via INTRAVENOUS

## 2019-11-30 MED ORDER — ATORVASTATIN CALCIUM 10 MG PO TABS
10.0000 mg | ORAL_TABLET | Freq: Every day | ORAL | Status: DC
  Administered 2019-11-30 – 2019-12-03 (×4): 10 mg via ORAL
  Filled 2019-11-30 (×4): qty 1

## 2019-11-30 MED ORDER — SPOT INK MARKER SYRINGE KIT
PACK | SUBMUCOSAL | Status: AC
  Filled 2019-11-30: qty 5

## 2019-11-30 MED ORDER — SODIUM CHLORIDE 0.9 % IV SOLN: INTRAVENOUS | Status: DC | PRN

## 2019-11-30 MED ORDER — SPOT INK MARKER SYRINGE KIT
PACK | SUBMUCOSAL | Status: DC | PRN
  Administered 2019-11-30: 3 mL via SUBMUCOSAL

## 2019-11-30 MED ORDER — PROPOFOL 500 MG/50ML IV EMUL
INTRAVENOUS | Status: DC | PRN
  Administered 2019-11-30: 100 ug/kg/min via INTRAVENOUS

## 2019-11-30 MED ORDER — GABAPENTIN 100 MG PO CAPS
100.0000 mg | ORAL_CAPSULE | Freq: Every day | ORAL | Status: DC
  Administered 2019-11-30 – 2019-12-02 (×3): 100 mg via ORAL
  Filled 2019-11-30 (×3): qty 1

## 2019-11-30 MED ORDER — DIALYVITE 800 0.8 MG PO TABS: 1.0000 | ORAL_TABLET | Freq: Every day | ORAL | Status: DC

## 2019-11-30 MED ORDER — FERRIC CITRATE 1 GM 210 MG(FE) PO TABS
210.0000 mg | ORAL_TABLET | Freq: Three times a day (TID) | ORAL | Status: DC
  Administered 2019-11-30 – 2019-12-01 (×3): 210 mg via ORAL
  Filled 2019-11-30 (×4): qty 1

## 2019-11-30 MED ORDER — HYDRALAZINE HCL 25 MG PO TABS
25.0000 mg | ORAL_TABLET | Freq: Two times a day (BID) | ORAL | Status: DC
  Administered 2019-11-30 – 2019-12-02 (×5): 25 mg via ORAL
  Filled 2019-11-30 (×7): qty 1

## 2019-11-30 SURGICAL SUPPLY — 21 items

## 2019-11-30 NOTE — Progress Notes (Signed)
PROGRESS NOTE  [place] HEN:[id number] DOB: 1941/02/08 DOA: 11/26/2019 PCP: [name], MD   LOS: 4 days   Brief narrative: As per HPI, Elijah White is a 79 y.o. male with medical history significant of ESRD on dialysis (TTS), hypertension, hyperlipidemia, diabetes mellitus, GERD, legally blind in left eye, chronic diastolic CHF presented to the emergency department for evaluation of chest pain/epigastric pain, shortness of breath and generalized weakness for 1 day.  Patient's daughter was driving him to dialysis unit when he developed chest pain so he was brought into the hospital he also had shortness of breath.  Patient had also lost 10 to 12 pounds in last 77-month since he has been started on dialysis.  Patient daughter stated that he lives alone at home and he is independent on daily life activities.  She is concerned as patient is legally blind from left eye and head recent fall with bruises on his arm and is concerned about safety at home.  Upon arrival to ED: Patient's vital signs stable, afebrile, no leukocytosis, maintaining oxygen saturation on room air, initial troponin 36 trended up to 37, EKG: No acute changes, COVID-19 negative.  CT head negative for acute findings, lipase: WNL.  CTA chest: Negative for PE or aortic dissection.  CT was concerning for 2.3 cm in size transverse colon mass, 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.  Chest x-ray showed pulmonary vascular congestion without pulmonary edema, mild cardiomegaly.  EDP consulted nephrology.  Patient was then admitted to hospital for further work-up and treatment.  Assessment/Plan:  Principal Problem:   Chest pain Active Problems:   Type 2 diabetes mellitus with renal manifestations (HCC)   Essential hypertension   GERD (gastroesophageal reflux disease)   HLD (hyperlipidemia)   ESRD (end stage renal disease) (HCC)   Macrocytic anemia   Adrenal mass (HCC)   Colonic mass  Chest pain/epigastric pain shortness of  breath. Improved. Chest x-ray showed some pulmonary vascular congestion, mild cardiomegaly.  There is a possibility of biliary colic, atypical pain.  Abdominal utrasound shows possible stone in the gallbladder. Continue PPI.  Troponin borderline elevated but in the context of ESRD.  EKG was unremarkable.  Cardiology on board.  2 D echo with moderate to severe Aortic stenosis but not surgical situation yet as per cardiology.   Cholelithiasis without cholecystitis.  Possibility of biliary colic.  Conservative treatment at this time.     ESRD on hemodialysis: Tuesday, Thursday, Saturday schedule as outpatient.  Nephrology on board for hemodialysis.  Bilateral mild hydronephrosis, could be prostate enlargement related. Foley, check renal ultrasound today.  Transverse colon mass: 2.3 cm in size.  Incidental finding.  Patient did have colonoscopy in 2009 and polyps were removed.  Patient underwent colonoscopic evaluation 11/30/2019 with findings of polyps which were biopsied.   Essential hypertension: Stable. Continue Coreg, hydralazine, Imdur.     Diabetes mellitus type II: Continue sliding scale insulin, Accu-Cheks, diabetic diet.  Last POC glucose of 103   GERD: Continue PPI   Diabetic neuropathy: Continue gabapentin   Chronic diastolic CHF: Continue Imdur, statin, Coreg. 2D echocardiogram from 09/02/2017 which showed ejection fraction of 65 to 70% and grade 1 diastolic dysfunction.  Continue intake and output charting, Daily weights. 2 D echocardiogram showed moderate to severe aortic stenosis   Hyperlipidemia: Continue statin   Macrocytic anemia: TSH of 0.8.  Vitamin B-12 level elevated.  Folate level within normal limits.   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    Likely to be benign  finding.  Does not have symptoms or signs of cushing syndrome, gonadotropin excess or problems with blood pressure.  I discussed this with the patient's daughter and the patient.  He does not wish to proceed with invasive  workup and would like to follow-up with primary care as outpatient.    Distended gallbladder with small gallstones: No evidence of cholecystitis on CT scan of the chest.  Right upper quadrant ultrasound shows cholelithiasis without acute evidence for cholecystitis.  There is possibility of stone at the gallbladder neck.   Lipase within normal range.  No further pain in the hospital.   Debility, deconditioning, legal blindness.  Recent fall at home.  History of glaucoma. Patient lives alone.  Safety concern due to recent fall at home.  Patient's daughter wishes skilled nursing facility on discharge patient is adamant about going home.  I had a long discussion with the patient's daughter about it.  At this time, we will alert the transition of care regarding the need for home health services on discharge.  DVT prophylaxis: heparin injection 5,000 Units Start: 11/26/19 1600 SCDs Start: 11/26/19 1554    Code Status: DNR  Family Communication: I spoke with the patient's daughter on the phone again today and updated her about the clinical condition of the patient and plan for disposition.  Status is: Inpatient  Remains inpatient appropriate because:Inpatient level of care appropriate due to severity of illness and status post Colonoscopic evaluation  Dispo: The patient is from: Home              Anticipated d/c is to: Home with home health, will need to ambulate              Anticipated d/c date is: Likely tomorrow with home health.              Patient currently is not medically stable to d/c.  Consultants:  GI  Nephrology   Cardiology   Procedures:  Hemodialysis  Antibiotics:  . None  Anti-infectives (From admission, onward)    None      Subjective: Today, patient was seen and examined at bedside.  Patient denies any shortness of breath, chest pain, nausea, vomiting or abdominal pain.  Seen after colonoscopic evaluation.  Objective: Vitals:   11/30/19 0546 11/30/19 0710   BP: (!) 146/71 (!) 182/73  Pulse: 67 75  Resp: 18 19  Temp: 97.8 F (36.6 C) 98 F (36.7 C)  SpO2: 100% 100%    Intake/Output Summary (Last 24 hours) at 11/30/2019 0903 Last data filed at 11/30/2019 0006 Gross per 24 hour  Intake 360 ml  Output 501 ml  Net -141 ml   Filed Weights   11/28/19 1045 11/29/19 0454 11/30/19 0005  Weight: 73.7 kg 74.2 kg 73.5 kg   Body mass index is 31.66 kg/m.   Physical Exam:  General:  Average built, not in obvious distress, legally blind HENT:   No scleral pallor or icterus noted. Oral mucosa is moist.  Blindness chest:  Clear breath sounds.  Diminished breath sounds bilaterally. No crackles or wheezes.  CVS: S1 &S2 heard.  Systolic murmur noted, regular rate and rhythm. Abdomen: Soft, nontender, nondistended.  Bowel sounds are heard.   Extremities: No cyanosis, clubbing or edema.  Peripheral pulses are palpable.  Left upper extremity AV fistula  Psych: Alert, awake and oriented, normal mood CNS:  No cranial nerve deficits.  Power equal in all extremities.   Skin: Warm and dry.   Data Review: I have  personally reviewed the following laboratory data and studies,  CBC: Recent Labs  Lab 11/26/19 1112 11/26/19 1816 11/27/19 1038 11/28/19 0248 11/30/19 0434  WBC 7.6 6.2 7.0 6.3 5.6  NEUTROABS 4.0  --   --   --   --   HGB 11.7* 12.1* 12.1* 11.3* 13.5  HCT 36.8* 36.9* 36.6* 35.0* 40.8  MCV 104.8* 102.5* 103.1* 101.4* 102.5*  PLT 194 209 214 208 366   Basic Metabolic Panel: Recent Labs  Lab 11/26/19 1112 11/26/19 1816 11/27/19 1038 11/28/19 0248 11/30/19 0434  NA 140  --  139 139 137  K 4.1  --  6.0* 4.1 4.0  CL 100  --  100 100 96*  CO2 29  --  24 25 23   GLUCOSE 199*  --  132* 126* 119*  BUN 27*  --  34* 41* 31*  CREATININE 6.92* 7.33* 7.96* 9.36* 7.64*  CALCIUM 9.8  --  9.3 9.5 10.3  MG  --  2.4  --  2.3  --    Liver Function Tests: Recent Labs  Lab 11/26/19 1112 11/27/19 1038  AST 19 41  ALT 15 14  ALKPHOS 99 72   BILITOT 0.5 1.3*  PROT 7.0 6.0*  ALBUMIN 3.7 3.3*   Recent Labs  Lab 11/26/19 1112  LIPASE 29   No results for input(s): AMMONIA in the last 168 hours. Cardiac Enzymes: No results for input(s): CKTOTAL, CKMB, CKMBINDEX, TROPONINI in the last 168 hours. BNP (last 3 results) No results for input(s): BNP in the last 8760 hours.  ProBNP (last 3 results) No results for input(s): PROBNP in the last 8760 hours.  CBG: Recent Labs  Lab 11/29/19 0620 11/29/19 1153 11/29/19 1725 11/29/19 2128 11/30/19 0558  GLUCAP 110* 169* 138* 146* 103*   Recent Results (from the past 240 hour(s))  Respiratory Panel by RT PCR (Flu A&B, Covid) - Nasopharyngeal Swab     Status: None   Collection Time: 11/26/19 11:12 AM   Specimen: Nasopharyngeal Swab  Result Value Ref Range Status   SARS Coronavirus 2 by RT PCR NEGATIVE NEGATIVE Final    Comment: (NOTE) SARS-CoV-2 target nucleic acids are NOT DETECTED.  The SARS-CoV-2 RNA is generally detectable in upper respiratoy specimens during the acute phase of infection. The lowest concentration of SARS-CoV-2 viral copies this assay can detect is 131 copies/mL. A negative result does not preclude SARS-Cov-2 infection and should not be used as the sole basis for treatment or other patient management decisions. A negative result may occur with  improper specimen collection/handling, submission of specimen other than nasopharyngeal swab, presence of viral mutation(s) within the areas targeted by this assay, and inadequate number of viral copies (<131 copies/mL). A negative result must be combined with clinical observations, patient history, and epidemiological information. The expected result is Negative.  Fact Sheet for Patients:  PinkCheek.be  Fact Sheet for Healthcare Providers:  GravelBags.it  This test is no t yet approved or cleared by the [place] FDA and  has been authorized for  detection and/or diagnosis of SARS-CoV-2 by FDA under an Emergency Use Authorization (EUA). This EUA will remain  in effect (meaning this test can be used) for the duration of the COVID-19 declaration under Section 564(b)(1) of the Act, 21 U.S.C. section 360bbb-3(b)(1), unless the authorization is terminated or revoked sooner.     Influenza A by PCR NEGATIVE NEGATIVE Final   Influenza B by PCR NEGATIVE NEGATIVE Final    Comment: (NOTE) The Xpert Xpress SARS-CoV-2/FLU/RSV assay  is intended as an aid in  the diagnosis of influenza from Nasopharyngeal swab specimens and  should not be used as a sole basis for treatment. Nasal washings and  aspirates are unacceptable for Xpert Xpress SARS-CoV-2/FLU/RSV  testing.  Fact Sheet for Patients: PinkCheek.be  Fact Sheet for Healthcare Providers: GravelBags.it  This test is not yet approved or cleared by the [place] FDA and  has been authorized for detection and/or diagnosis of SARS-CoV-2 by  FDA under an Emergency Use Authorization (EUA). This EUA will remain  in effect (meaning this test can be used) for the duration of the  Covid-19 declaration under Section 564(b)(1) of the Act, 21  U.S.C. section 360bbb-3(b)(1), unless the authorization is  terminated or revoked. Performed at [facility], [place] [address] [place], [place] [address]   MRSA PCR Screening     Status: None   Collection Time: 11/26/19  9:49 PM   Specimen: Nasal Mucosa; Nasopharyngeal  Result Value Ref Range Status   MRSA by PCR NEGATIVE NEGATIVE Final    Comment:        The GeneXpert MRSA Assay (FDA approved for NASAL specimens only), is one component of a comprehensive MRSA colonization surveillance program. It is not intended to diagnose MRSA infection nor to guide or monitor treatment for MRSA infections. Performed at [facility], [place] [address], [place], [place] [address]       Studies: ECHOCARDIOGRAM COMPLETE  Result Date: 11/29/2019    ECHOCARDIOGRAM REPORT   Patient Name:   ANGELDEJESUS CALLAHAM Date of Exam: 11/28/2019 Medical Rec #:  [id number]       Height:       60.0 in Accession #:    [id number]      Weight:       162.5 lb Date of Birth:  [birth year]       BSA:          1.709 m Patient Age:    89 years        BP:           155/71 mmHg Patient Gender: M               HR:           76 bpm. Exam Location:  Inpatient Procedure: 2D Echo Indications:    chest pain 786.50  History:        Patient has prior history of Echocardiogram examinations, most                 recent 09/02/2017. End stage renal disease, Aortic Valve Disease;                 Risk Factors:Diabetes and Dyslipidemia.  Sonographer:    Johny Chess Referring Phys: [id number] Champ Mungo TAYLOR  Sonographer Comments: Image acquisition challenging due to respiratory motion. IMPRESSIONS  1. Left ventricular ejection fraction, by estimation, is 65 to 70%. The left ventricle has normal function. The left ventricle has no regional wall motion abnormalities. Left ventricular diastolic parameters are indeterminate.  2. Right ventricular systolic function is normal. The right ventricular size is normal. Tricuspid regurgitation signal is inadequate for assessing PA pressure.  3. The mitral valve is degenerative. Trivial mitral valve regurgitation. No evidence of mitral stenosis. Severe mitral annular calcification.  4. The aortic valve is calcified. There is severe calcifcation of the aortic valve. There is severe thickening of the aortic valve. Aortic valve regurgitation is trivial. Moderate to severe aortic valve  stenosis.  5. The inferior vena cava is normal in size with greater than 50% respiratory variability, suggesting right atrial pressure of 3 mmHg. FINDINGS  Left Ventricle: Left ventricular ejection fraction, by estimation, is 65 to 70%. The left ventricle has normal function. The left ventricle has no regional wall motion  abnormalities. The left ventricular internal cavity size was normal in size. There is  no left ventricular hypertrophy. Left ventricular diastolic parameters are indeterminate. Right Ventricle: The right ventricular size is normal. No increase in right ventricular wall thickness. Right ventricular systolic function is normal. Tricuspid regurgitation signal is inadequate for assessing PA pressure. Left Atrium: Left atrial size was normal in size. Right Atrium: Right atrial size was normal in size. Pericardium: There is no evidence of pericardial effusion. Mitral Valve: The mitral valve is degenerative in appearance. Severe mitral annular calcification. Trivial mitral valve regurgitation. No evidence of mitral valve stenosis. Tricuspid Valve: The tricuspid valve is grossly normal. Tricuspid valve regurgitation is not demonstrated. Aortic Valve: The aortic valve is calcified. There is severe calcifcation of the aortic valve. There is severe thickening of the aortic valve. Aortic valve regurgitation is trivial. Moderate to severe aortic stenosis is present. Aortic valve mean gradient measures 26.0 mmHg. Aortic valve peak gradient measures 54.8 mmHg. Aortic valve area, by VTI measures 1.06 cm. Pulmonic Valve: The pulmonic valve was not well visualized. Pulmonic valve regurgitation is not visualized. Aorta: The aortic root is normal in size and structure. Venous: The inferior vena cava is normal in size with greater than 50% respiratory variability, suggesting right atrial pressure of 3 mmHg. IAS/Shunts: No atrial level shunt detected by color flow Doppler.  LEFT VENTRICLE PLAX 2D LVIDd:         3.90 cm  Diastology LVIDs:         2.35 cm  LV e' medial:    4.57 cm/s LV PW:         1.00 cm  LV E/e' medial:  15.5 LV IVS:        0.90 cm  LV e' lateral:   8.05 cm/s LVOT diam:     2.08 cm  LV E/e' lateral: 8.8 LV SV:         76 LV SV Index:   44 LVOT Area:     3.40 cm  RIGHT VENTRICLE             IVC RV S prime:     11.00  cm/s  IVC diam: 1.25 cm TAPSE (M-mode): 2.0 cm LEFT ATRIUM             Index       RIGHT ATRIUM          Index LA diam:        3.40 cm 1.99 cm/m  RA Area:     9.48 cm LA Vol (A2C):   53.7 ml 31.42 ml/m RA Volume:   18.10 ml 10.59 ml/m LA Vol (A4C):   30.6 ml 17.91 ml/m LA Biplane Vol: 41.8 ml 24.46 ml/m  AORTIC VALVE AV Area (Vmax):    0.89 cm AV Area (Vmean):   0.97 cm AV Area (VTI):     1.06 cm AV Vmax:           370.00 cm/s AV Vmean:          231.000 cm/s AV VTI:            0.716 m AV Peak Grad:      54.8 mmHg AV  Mean Grad:      26.0 mmHg LVOT Vmax:         96.71 cm/s LVOT Vmean:        66.068 cm/s LVOT VTI:          0.223 m LVOT/AV VTI ratio: 0.31  AORTA Ao Root diam: 3.30 cm MITRAL VALVE MV Area (PHT): 1.94 cm     SHUNTS MV Decel Time: 391 msec     Systemic VTI:  0.22 m MV E velocity: 71.00 cm/s   Systemic Diam: 2.08 cm MV A velocity: 140.00 cm/s MV E/A ratio:  0.51 Mihai Croitoru MD Electronically signed by Sanda Klein MD Signature Date/Time: 11/29/2019/8:09:10 AM    Final      Flora Lipps, MD  [facility] 11/30/2019

## 2019-11-30 NOTE — Op Note (Addendum)
[facility] Patient Name: Elijah White Procedure Date : 11/30/2019 MRN: [id number] Attending MD: Ronnette Juniper , MD Date of Birth: 06/28/1940 CSN: [id number] Age: 79 Admit Type: Inpatient Procedure:                Colonoscopy Indications:              Abnormal CT of the GI tract Providers:                Ronnette Juniper, MD, Josie Dixon, RN, Particia Nearing,                            RN, Cherylynn Ridges, Technician, Trixie Deis, CRNA Referring MD:             Baltazar Apo Medicines:                Monitored Anesthesia Care Complications:            No immediate complications. Estimated blood loss:                            Minimal. Estimated Blood Loss:     Estimated blood loss was minimal. Procedure:                Pre-Anesthesia Assessment:                           - Prior to the procedure, a History and Physical                            was performed, and patient medications and                            allergies were reviewed. The patient's tolerance of                            previous anesthesia was also reviewed. The risks                            and benefits of the procedure and the sedation                            options and risks were discussed with the patient.                            All questions were answered, and informed consent                            was obtained. Prior Anticoagulants: The patient has                            taken no previous anticoagulant or antiplatelet                            agents. ASA Grade Assessment: III - A patient with  severe systemic disease. After reviewing the risks                            and benefits, the patient was deemed in                            satisfactory condition to undergo the procedure.                           After obtaining informed consent, the colonoscope                            was passed under direct vision. Throughout the                             procedure, the patient's blood pressure, pulse, and                            oxygen saturations were monitored continuously. The                            [id number]-H190DL ([id number] Olympus pediatric colonscope                            was introduced through the anus and advanced to the                            the cecum, identified by appendiceal orifice and                            ileocecal valve. The colonoscopy was performed                            without difficulty. The patient tolerated the                            procedure well. The quality of the bowel                            preparation was good. Scope In: 8:03:05 AM Scope Out: 8:54:49 AM Scope Withdrawal Time: 0 hours 48 minutes 1 second  Total Procedure Duration: 0 hours 51 minutes 44 seconds  Findings:      Skin tags were found on perianal exam.      A 20 mm polyp was found in the appendiceal orifice. The polyp was       sessile. The polyp was removed with a piecemeal technique using a hot       snare. The center of the polyp appeared flat and was removed in a       piecemeal fashion using cold biopsy forceps. Resection and retrieval       were complete. To close a defect after polypectomy, two hemostatic clips       were successfully placed (MR conditional). There was no bleeding at the       end of the procedure.  Three sessile polyps were found in the ascending colon. The polyps were       9 to 12 mm in size. These polyps were removed with a hot snare.       Resection and retrieval were complete. For hemostasis, two hemostatic       clips were successfully placed (MR conditional) at 2 of the polypectomy       sites. There was no bleeding at the end of the procedure.      Four sessile polyps were found in the descending colon(#1) and       transverse colon(#3). The polyps were 5 to 30 mm in size. These polyps       were removed with a hot snare. Resection and retrieval were complete.       The  largest polypectomy site was tattooed with an injection of 3 mL of       [place] ink. A basket was used to retrieve the pieces of polyps. To close       a defect after polypectomy, one hemostatic clip was successfully placed       (MR conditional). There was no bleeding at the end of the procedure.      A 8 mm polyp was found in the rectum. The polyp was sessile. The polyp       was removed with a hot snare. Resection and retrieval were complete.      Non-bleeding internal hemorrhoids were found during retroflexion. The       hemorrhoids were moderate.      The entire examined colon appeared normal. Impression:               - Perianal skin tags found on perianal exam.                           - One 20 mm polyp at the appendiceal orifice,                            removed piecemeal using a hot snare. Resected and                            retrieved. Clips (MR conditional) were placed.                           - Three 9 to 12 mm polyps in the ascending colon,                            removed with a hot snare. Resected and retrieved.                            Clips (MR conditional) were placed.                           - Four 5 to 30 mm polyps in the descending colon                            and in the transverse colon, removed with a hot  snare. Resected and retrieved. Tattooed. Clip (MR                            conditional) was placed.                           - One 8 mm polyp in the rectum, removed with a hot                            snare. Resected and retrieved.                           - Non-bleeding internal hemorrhoids.                           - The entire examined colon is normal. Moderate Sedation:      Patient did not receive moderate sedation for this procedure, but       instead received monitored anesthesia care. Recommendation:           - Resume regular diet.                           - Continue present medications.                            - Await pathology results.                           - Repeat colonoscopy for surveillance based on                            pathology results. Procedure Code(s):        --- Professional ---                           [phone], Colonoscopy, flexible; with removal of                            tumor(s), polyp(s), or other lesion(s) by snare                            technique                           [id number], Colonoscopy, flexible; with directed                            submucosal injection(s), any substance Diagnosis Code(s):        --- Professional ---                           K63.5, Polyp of colon                           K62.1, Rectal polyp  K64.4, Residual hemorrhoidal skin tags                           K64.8, Other hemorrhoids                           R93.3, Abnormal findings on diagnostic imaging of                            other parts of digestive tract CPT copyright 2019 [employer]. All rights reserved. The codes documented in this report are preliminary and upon coder review may  be revised to meet current compliance requirements. Ronnette Juniper, MD 11/30/2019 9:04:06 AM This report has been signed electronically. Number of Addenda: 0

## 2019-11-30 NOTE — Brief Op Note (Signed)
11/26/2019 - 11/30/2019  9:04 AM  PATIENT:  Elijah White  79 y.o. male  PRE-OPERATIVE DIAGNOSIS:  abnormal CT scan  POST-OPERATIVE DIAGNOSIS:  Cecal polyp (removed and biopsy) - 2 clips, 3 ascending polyps 1 clip, 3 transverse polyps - 1 clip and 3 spot, 1 descending polyp, 2 rectal polyps  PROCEDURE:  Procedure(s): COLONOSCOPY WITH PROPOFOL (N/A) POLYPECTOMY HEMOSTASIS CLIP PLACEMENT SUBMUCOSAL TATTOO INJECTION BIOPSY  SURGEON:  Surgeon(s) and Role:    Ronnette Juniper, MD - Primary  PHYSICIAN ASSISTANT:   ASSISTANTS: Maggie Chrismon,RN,Janie Billups,Tech  ANESTHESIA:   MAC  EBL:  Minimal  BLOOD ADMINISTERED:none  DRAINS: none   LOCAL MEDICATIONS USED:  NONE  SPECIMEN:  Biopsy / Limited Resection  DISPOSITION OF SPECIMEN:  PATHOLOGY  COUNTS:  YES  TOURNIQUET:  * No tourniquets in log *  DICTATION: .Dragon Dictation  PLAN OF CARE: Admit to inpatient   PATIENT DISPOSITION:  PACU - hemodynamically stable.   Delay start of Pharmacological VTE agent (>24hrs) due to surgical blood loss or risk of bleeding: not applicable

## 2019-11-30 NOTE — Chronic Care Management (AMB) (Signed)
  Chronic Care Management   Inpatient Admit Review Note  11/30/2019 Name: Elijah White MRN: [id number] DOB: [birth year]  Elijah White is a 79 y.o. year old male who is a primary care patient of [name], MD. Elijah White is actively engaged with the embedded care management team in the primary care practice and is being followed by RN Case Manager BSW for assistance with disease management and care coordination needs related to ESRD.   Elijah White is currently admitted to the hospital for evaluation and treatment of precordial pain and generalized weakness.  Plan: CM team will collaborate with [facility] and will follow patient post discharge.   [name], BSW, CDP Social Worker, Certified Dementia Practitioner [place] [facility] [place] [facility] [phone]

## 2019-11-30 NOTE — Anesthesia Procedure Notes (Signed)
Procedure Name: MAC Date/Time: 11/30/2019 7:50 AM Performed by: Leonor Liv, CRNA Pre-anesthesia Checklist: Patient identified, Emergency Drugs available, Suction available, Patient being monitored and Timeout performed Patient Re-evaluated:Patient Re-evaluated prior to induction Oxygen Delivery Method: Simple face mask Placement Confirmation: positive ETCO2 Dental Injury: Teeth and Oropharynx as per pre-operative assessment

## 2019-11-30 NOTE — Anesthesia Preprocedure Evaluation (Signed)
Anesthesia Evaluation  Patient identified by MRN, date of birth, ID band Patient awake    Reviewed: Allergy & Precautions, NPO status , Patient's Chart, lab work & pertinent test results, reviewed documented beta blocker date and time   History of Anesthesia Complications Negative for: history of anesthetic complications  Airway Mallampati: III  TM Distance: >3 FB Neck ROM: Full    Dental  (+) Poor Dentition, Missing, Chipped, Dental Advisory Given,    Pulmonary neg shortness of breath, neg sleep apnea, neg COPD, neg recent URI, former smoker,  Covid-19 Nucleic Acid Test Results Lab Results      Component                Value               Date                      SARSCOV2NAA              NEGATIVE            11/26/2019              breath sounds clear to auscultation       Cardiovascular hypertension, Pt. on home beta blockers and Pt. on medications (-) angina+CHF  (-) Past MI (-) dysrhythmias + Valvular Problems/Murmurs  Rhythm:Regular + Systolic murmurs 1. Left ventricular ejection fraction, by estimation, is 65 to 70%. The  left ventricle has normal function. The left ventricle has no regional  wall motion abnormalities. Left ventricular diastolic parameters are  indeterminate.  2. Right ventricular systolic function is normal. The right ventricular  size is normal. Tricuspid regurgitation signal is inadequate for assessing  PA pressure.  3. The mitral valve is degenerative. Trivial mitral valve regurgitation.  No evidence of mitral stenosis. Severe mitral annular calcification.  4. The aortic valve is calcified. There is severe calcifcation of the  aortic valve. There is severe thickening of the aortic valve. Aortic valve  regurgitation is trivial. 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  5. The inferior vena cava is normal in size with greater than 50%  respiratory variability, suggesting right atrial pressure of 3  mmHg.    Neuro/Psych negative neurological ROS  negative psych ROS   GI/Hepatic Neg liver ROS, GERD  Medicated and Controlled,  Endo/Other  diabetes  Renal/GU ESRF and DialysisRenal diseaseLast HD 9/25,  Lab Results      Component                Value               Date                      CREATININE               7.64 (H)            11/30/2019           Lab Results      Component                Value               Date                      K                        4.0  11/30/2019                Musculoskeletal  (+) Arthritis ,   Abdominal   Peds  Hematology  (+) Blood dyscrasia, anemia , Lab Results      Component                Value               Date                      WBC                      5.6                 11/30/2019                HGB                      13.5                11/30/2019                HCT                      40.8                11/30/2019                MCV                      102.5 (H)           11/30/2019                PLT                      195                 11/30/2019              Anesthesia Other Findings   Reproductive/Obstetrics                             Anesthesia Physical Anesthesia Plan  ASA: III  Anesthesia Plan: MAC   Post-op Pain Management:    Induction: Intravenous  PONV Risk Score and Plan: 1 and Treatment may vary due to age or medical condition and Propofol infusion  Airway Management Planned: Nasal Cannula  Additional Equipment: None  Intra-op Plan:   Post-operative Plan:   Informed Consent: I have reviewed the patients History and Physical, chart, labs and discussed the procedure including the risks, benefits and alternatives for the proposed anesthesia with the patient or authorized representative who has indicated his/her understanding and acceptance.     Dental advisory given  Plan Discussed with: CRNA and Surgeon  Anesthesia Plan Comments:          Anesthesia Quick Evaluation

## 2019-11-30 NOTE — Consult Note (Signed)
   [facility] Inpatient Consult   11/30/2019  ROGER KETTLES 1940/05/29 [id number]   [employer] [facility] [ACO] Patient: [employer]  Patient was screened for [place] Management services. Patient will have the transition of care call conducted by the primary care provider. This patient is active in the  Embedded practice  chronic disease management in the Chronic Care Management program and team.  Plan: Will follow up with [place] Management team for updates on patient progress and disposition needs.   Please contact for further questions,  Natividad Brood, RN BSN [place] Liaison  [phone] business mobile phone Toll free office [phone]  Fax number: [phone] [email or web address]

## 2019-11-30 NOTE — Progress Notes (Signed)
Elijah White Progress Note   Dialysis Orders: TTS [place] 4 hr EDW 76 2 K 2 Ca 400/800 left upper AVF  no heparin no Fe no ESA hectorol 7 Recent labs: hgb 12 iPTH 300s Ca/P controlled - 2 Elijah White ac; very compliant with treatemtns   Assessment/Plan: 1. 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  Seen by cardiology, min ^troponin, no w/u needed.  2. Abd pain - PPI started, ^total bil noted, US showed GB distension with stone wedged at GB neck no acute cholecystitis. Per pmd.  3. Colon mass - s/p colonoscopy per GI 4. 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 Pt started HD Feb 2021, but no recent imaging in Epic.  Replaced foley and renal US repeat with resolution of hydronephrosis/improvement in bladder outlet obstruction.  Continue foley and will start flomax 5. ESRD -  TTS HD 6. Hypertension/volume  - new EDW at d/c 7. Anemia  -no ESA/ IV Fe ; note colon mass 8. DM - per primary 9. Metabolic bone disease -  Continue hectorol- resume binders after colonsocopy - on 2 Elijah White ac 10. Nutrition - renal carb mod after GI eval - CL for now resume vits post procedure       Subjective:    Last HD on 9/25 with 1.5 kg UF.  Feels ok today - asks when foley can come out.  States makes some urine every day.  No dizziness or cramping with HD  Review of systems:  Denies shortness of breath or chest pain No n/v   Objective Vitals:   11/30/19 0906 11/30/19 0914 11/30/19 0918 11/30/19 0944  BP: (!) 150/49 (!) 108/36 (!) 105/34 (!) 159/76  Pulse: 73 77 77 80  Resp: 17 15 20 17   Temp:    (!) 97.4 F (36.3 C)  TempSrc:    Oral  SpO2: 100% 100% 100% 100%  Weight:      Height:       Physical Exam   General: adult male in bed in NAD Heart: S1S2 no rub Lungs: clear and unlabored on room air  Abdomen: soft NT Extremities: no LE  edema  Dialysis Access: left upper AVF +bruit and thrill  Neuro - alert and oriented x3 provides hx and follows commands; blind  Psych normal mood and affect   Additional Objective Labs: Basic Metabolic Panel: Recent Labs  Lab 11/27/19 1038 11/28/19 0248 11/30/19 0434  NA 139 139 137  K 6.0* 4.1 4.0  CL 100 100 96*  CO2 24 25 23   GLUCOSE 132* 126* 119*  BUN 34* 41* 31*  CREATININE 7.96* 9.36* 7.64*  CALCIUM 9.3 9.5 10.3   Liver Function Tests: Recent Labs  Lab 11/26/19 1112 11/27/19 1038  AST 19 41  ALT 15 14  ALKPHOS 99 72  BILITOT 0.5 1.3*  PROT 7.0 6.0*  ALBUMIN 3.7 3.3*   Recent Labs  Lab 11/26/19 1112  LIPASE 29   CBC: Recent Labs  Lab 11/26/19 1112 11/26/19 1112 11/26/19 1816 11/26/19 1816 11/27/19 1038 11/28/19 0248 11/30/19 0434  WBC 7.6   < > 6.2   < > 7.0 6.3 5.6  NEUTROABS 4.0  --   --   --   --   --   --   HGB 11.7*   < > 12.1*   < > 12.1* 11.3* 13.5  HCT 36.8*   < >  36.9*   < > 36.6* 35.0* 40.8  MCV 104.8*  --  102.5*  --  103.1* 101.4* 102.5*  PLT 194   < > 209   < > 214 208 195   < > = values in this interval not displayed.   Blood Culture    Component Value Date/Time   SDES URINE, RANDOM 06/05/2007 0039   SPECREQUEST NONE 06/05/2007 0039   CULT NO GROWTH 06/05/2007 0039   REPTSTATUS 06/06/2007 FINAL 06/05/2007 0039    Cardiac Enzymes: No results for input(s): CKTOTAL, CKMB, CKMBINDEX, TROPONINI in the last 168 hours. CBG: Recent Labs  Lab 11/29/19 1153 11/29/19 1725 11/29/19 2128 11/30/19 0558 11/30/19 1122  GLUCAP 169* 138* 146* 103* 110*   Iron Studies: No results for input(s): IRON, TIBC, TRANSFERRIN, FERRITIN in the last 72 hours. Lab Results  Component Value Date   INR 1.0 11/26/2019   INR 1.11 03/25/2018   Studies/Results: US RENAL  Result Date: 11/30/2019 CLINICAL DATA:  Right-sided hydronephrosis seen incidentally on a recent prior right upper quadrant ultrasound. EXAM: RENAL / URINARY TRACT ULTRASOUND  COMPLETE COMPARISON:  Right upper quadrant ultrasound 11/26/2019; prior renal ultrasound 11/10/2013 FINDINGS: Right Kidney: Renal measurements: 10.1 x 4.9 x 3.8 cm = volume: 97.6 mL. Interval resolution of hydronephrosis. Mild diffuse renal cortical thinning with lipomatous hypertrophy of the renal pelvis. The renal parenchyma is mildly echogenic. No solid lesion identified. Left Kidney: Renal measurements: 9.2 x 4.0 x 4.2 cm = volume: 77.3 mL. No evidence of hydronephrosis. Mild diffuse renal cortical thinning with lipomatous hypertrophy of the renal sinus fat. The renal parenchyma is mildly echogenic. No focal solid lesion identified. Bladder: Foley catheter visualized within the collapsed bladder. Other: None. IMPRESSION: 1. Foley catheter present within the bladder. 2. Interval resolution of hydronephrosis, presumably related to relief of bladder outlet obstruction. 3. Mildly echogenic kidneys bilaterally consistent with underlying medical renal disease. 4. Mild renal cortical thinning bilaterally. Electronically Signed   By: Jacqulynn Cadet M.D.   On: 11/30/2019 10:57   ECHOCARDIOGRAM COMPLETE  Result Date: 11/29/2019    ECHOCARDIOGRAM REPORT   Patient Name:   Elijah White Date of Exam: 11/28/2019 Medical Rec #:  [id number]       Height:       60.0 in Accession #:    [id number]      Weight:       162.5 lb Date of Birth:  [birth year]       BSA:          1.709 m Patient Age:    79 years        BP:           155/71 mmHg Patient Gender: M               HR:           76 bpm. Exam Location:  Inpatient Procedure: 2D Echo Indications:    chest pain 786.50  History:        Patient has prior history of Echocardiogram examinations, most                 recent 09/02/2017. End stage renal disease, Aortic Valve Disease;                 Risk Factors:Diabetes and Dyslipidemia.  Sonographer:    Elijah White Referring Phys: [id number] Champ Mungo TAYLOR  Sonographer Comments: Image acquisition challenging due to respiratory  motion. IMPRESSIONS  1. Left ventricular ejection fraction, by estimation,  is 65 to 70%. The left ventricle has normal function. The left ventricle has no regional wall motion abnormalities. Left ventricular diastolic parameters are indeterminate.  2. Right ventricular systolic function is normal. The right ventricular size is normal. Tricuspid regurgitation signal is inadequate for assessing PA pressure.  3. The mitral valve is degenerative. Trivial mitral valve regurgitation. No evidence of mitral stenosis. Severe mitral annular calcification.  4. The aortic valve is calcified. There is severe calcifcation of the aortic valve. There is severe thickening of the aortic valve. Aortic valve regurgitation is trivial. 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  5. The inferior vena cava is normal in size with greater than 50% respiratory variability, suggesting right atrial pressure of 3 mmHg. FINDINGS  Left Ventricle: Left ventricular ejection fraction, by estimation, is 65 to 70%. The left ventricle has normal function. The left ventricle has no regional wall motion abnormalities. The left ventricular internal cavity size was normal in size. There is  no left ventricular hypertrophy. Left ventricular diastolic parameters are indeterminate. Right Ventricle: The right ventricular size is normal. No increase in right ventricular wall thickness. Right ventricular systolic function is normal. Tricuspid regurgitation signal is inadequate for assessing PA pressure. Left Atrium: Left atrial size was normal in size. Right Atrium: Right atrial size was normal in size. Pericardium: There is no evidence of pericardial effusion. Mitral Valve: The mitral valve is degenerative in appearance. Severe mitral annular calcification. Trivial mitral valve regurgitation. No evidence of mitral valve stenosis. Tricuspid Valve: The tricuspid valve is grossly normal. Tricuspid valve regurgitation is not demonstrated. Aortic Valve: The aortic  valve is calcified. There is severe calcifcation of the aortic valve. There is severe thickening of the aortic valve. Aortic valve regurgitation is trivial. Moderate to severe aortic stenosis is present. Aortic valve mean gradient measures 26.0 mmHg. Aortic valve peak gradient measures 54.8 mmHg. Aortic valve area, by VTI measures 1.06 cm. Pulmonic Valve: The pulmonic valve was not well visualized. Pulmonic valve regurgitation is not visualized. Aorta: The aortic root is normal in size and structure. Venous: The inferior vena cava is normal in size with greater than 50% respiratory variability, suggesting right atrial pressure of 3 mmHg. IAS/Shunts: No atrial level shunt detected by color flow Doppler.  LEFT VENTRICLE PLAX 2D LVIDd:         3.90 cm  Diastology LVIDs:         2.35 cm  LV e' medial:    4.57 cm/s LV PW:         1.00 cm  LV E/e' medial:  15.5 LV IVS:        0.90 cm  LV e' lateral:   8.05 cm/s LVOT diam:     2.08 cm  LV E/e' lateral: 8.8 LV SV:         76 LV SV Index:   44 LVOT Area:     3.40 cm  RIGHT VENTRICLE             IVC RV S prime:     11.00 cm/s  IVC diam: 1.25 cm TAPSE (M-mode): 2.0 cm LEFT ATRIUM             Index       RIGHT ATRIUM          Index LA diam:        3.40 cm 1.99 cm/m  RA Area:     9.48 cm LA Vol (A2C):   53.7 ml 31.42 ml/m RA Volume:  18.10 ml 10.59 ml/m LA Vol (A4C):   30.6 ml 17.91 ml/m LA Biplane Vol: 41.8 ml 24.46 ml/m  AORTIC VALVE AV Area (Vmax):    0.89 cm AV Area (Vmean):   0.97 cm AV Area (VTI):     1.06 cm AV Vmax:           370.00 cm/s AV Vmean:          231.000 cm/s AV VTI:            0.716 m AV Peak Grad:      54.8 mmHg AV Mean Grad:      26.0 mmHg LVOT Vmax:         96.71 cm/s LVOT Vmean:        66.068 cm/s LVOT VTI:          0.223 m LVOT/AV VTI ratio: 0.31  AORTA Ao Root diam: 3.30 cm MITRAL VALVE MV Area (PHT): 1.94 cm     SHUNTS MV Decel Time: 391 msec     Systemic VTI:  0.22 m MV E velocity: 71.00 cm/s   Systemic Diam: 2.08 cm MV A velocity: 140.00  cm/s MV E/A ratio:  0.51 Elijah Croitoru MD Electronically signed by Sanda Klein MD Signature Date/Time: 11/29/2019/8:09:10 AM    Final    Medications:  . apraclonidine  1 drop Right Eye TID  . atorvastatin  10 mg Oral Daily  . carvedilol  3.125 mg Oral BID WC  . Chlorhexidine Gluconate Cloth  6 each Topical Q0600  . dorzolamide-timolol  1 drop Right Eye BID  . doxercalciferol  7 mcg Intravenous Q T,Th,Sa-HD  . ferric citrate  210 mg Oral TID WC  . gabapentin  100 mg Oral QHS  . heparin  5,000 Units Subcutaneous Q8H  . hydrALAZINE  25 mg Oral BID  . insulin aspart  0-5 Units Subcutaneous QHS  . insulin aspart  0-6 Units Subcutaneous TID WC  . isosorbide mononitrate  30 mg Oral Daily  . latanoprost  1 drop Right Eye QHS  . multivitamin  1 tablet Oral QHS  . pantoprazole  40 mg Oral Daily  . pilocarpine  1 drop Right Eye QID    Claudia Desanctis, MD 11/30/2019  4:22 PM

## 2019-11-30 NOTE — Transfer of Care (Signed)
Immediate Anesthesia Transfer of Care Note  Patient: Elijah White  Procedure(s) Performed: COLONOSCOPY WITH PROPOFOL (N/A ) POLYPECTOMY HEMOSTASIS CLIP PLACEMENT SUBMUCOSAL TATTOO INJECTION BIOPSY  Patient Location: Endoscopy Unit  Anesthesia Type:MAC  Level of Consciousness: drowsy  Airway & Oxygen Therapy: Patient Spontanous Breathing and Patient connected to face mask oxygen  Post-op Assessment: Report given to RN and Post -op Vital signs reviewed and stable  Post vital signs: Reviewed and stable  Last Vitals:  Vitals Value Taken Time  BP 150/49 11/30/19 0906  Temp    Pulse 73 11/30/19 0907  Resp 20 11/30/19 0907  SpO2 100 % 11/30/19 0907  Vitals shown include unvalidated device data.  Last Pain:  Vitals:   11/30/19 0905  TempSrc:   PainSc: 0-No pain      Patients Stated Pain Goal: 2 (75/88/32 [id number])  Complications: No complications documented.

## 2019-12-01 ENCOUNTER — Encounter (HOSPITAL_COMMUNITY): Payer: Self-pay | Admitting: Gastroenterology

## 2019-12-01 ENCOUNTER — Inpatient Hospital Stay (HOSPITAL_COMMUNITY): Payer: Medicare Other

## 2019-12-01 DIAGNOSIS — I350 Nonrheumatic aortic (valve) stenosis: Secondary | ICD-10-CM

## 2019-12-01 LAB — RENAL FUNCTION PANEL
Albumin: 3.1 g/dL — ABNORMAL LOW (ref 3.5–5.0)
Anion gap: 16 — ABNORMAL HIGH (ref 5–15)
BUN: 40 mg/dL — ABNORMAL HIGH (ref 8–23)
CO2: 24 mmol/L (ref 22–32)
Calcium: 9.6 mg/dL (ref 8.9–10.3)
Chloride: 97 mmol/L — ABNORMAL LOW (ref 98–111)
Creatinine, Ser: 9.18 mg/dL — ABNORMAL HIGH (ref 0.61–1.24)
GFR calc Af Amer: 6 mL/min — ABNORMAL LOW (ref >60)
GFR calc non Af Amer: 5 mL/min — ABNORMAL LOW (ref >60)
Glucose, Bld: 119 mg/dL — ABNORMAL HIGH (ref 70–99)
Phosphorus: 6.5 mg/dL — ABNORMAL HIGH (ref 2.5–4.6)
Potassium: 4.1 mmol/L (ref 3.5–5.1)
Sodium: 137 mmol/L (ref 135–145)

## 2019-12-01 LAB — GLUCOSE, CAPILLARY
Glucose-Capillary: 114 mg/dL — ABNORMAL HIGH (ref 70–99)
Glucose-Capillary: 141 mg/dL — ABNORMAL HIGH (ref 70–99)
Glucose-Capillary: 163 mg/dL — ABNORMAL HIGH (ref 70–99)
Glucose-Capillary: 148 mg/dL — ABNORMAL HIGH (ref 70–99)

## 2019-12-01 LAB — CBC
HCT: 33.8 % — ABNORMAL LOW (ref 39.0–52.0)
Hemoglobin: 10.9 g/dL — ABNORMAL LOW (ref 13.0–17.0)
MCH: 33 pg (ref 26.0–34.0)
MCHC: 32.2 g/dL (ref 30.0–36.0)
MCV: 102.4 fL — ABNORMAL HIGH (ref 80.0–100.0)
Platelets: 228 10*3/uL (ref 150–400)
RBC: 3.3 MIL/uL — ABNORMAL LOW (ref 4.22–5.81)
RDW: 12.6 % (ref 11.5–15.5)
WBC: 6.2 10*3/uL (ref 4.0–10.5)
nRBC: 0 % (ref 0.0–0.2)

## 2019-12-01 LAB — CORTISOL-AM, BLOOD: Cortisol - AM: 9 ug/dL (ref 6.7–22.6)

## 2019-12-01 LAB — SURGICAL PATHOLOGY

## 2019-12-01 MED ORDER — METOPROLOL TARTRATE 100 MG PO TABS
100.0000 mg | ORAL_TABLET | Freq: Once | ORAL | Status: AC
  Administered 2019-12-01: 100 mg via ORAL
  Filled 2019-12-01: qty 1

## 2019-12-01 MED ORDER — IOHEXOL 350 MG/ML SOLN
95.0000 mL | Freq: Once | INTRAVENOUS | Status: AC | PRN
  Administered 2019-12-01: 95 mL via INTRAVENOUS

## 2019-12-01 MED ORDER — DOXERCALCIFEROL 4 MCG/2ML IV SOLN
INTRAVENOUS | Status: AC
  Filled 2019-12-01: qty 4

## 2019-12-01 NOTE — Consult Note (Addendum)
[place] VALVE TEAM  Inpatient TAVR Consultation:   Patient ID: Elijah White; [id number]; 07-Aug-1940   Admit date: 11/26/2019 Date of Consult: 12/01/2019  Primary Care Provider: [name], MD Primary Cardiologist: New to [facility]- Dr. Audie Box  Patient Profile:   Elijah White is a 79 y.o. male with a hx of ESRD on HD (TTS), HTN, DMT2, legal blindness, anemia, RBBB and chronic diastolic CHF who is being seen today for the evaluation of moderate to severe AS at the request of [place].  History of Present Illness:   [name] lives in apartment in [place].  He has been widowed for 14 years.  He has 2 kids, 1 of which who lives locally.  He no longer drives given legal blindness.  His daughter and niece help him get to the grocery store.  He walks with the assistance of a walker.  He does live independently and do his own cooking and cleaning; however, he is very sedentary at baseline.  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  He has been fully vaccinated for COVID-19.  Of note, he has poor dentition with many missing teeth and has not seen a dentist in a very long time.  He was placed on HD in 04/2019. He is followed by Dr. [name]. He was admitted to the hospital in 09/2017 for acute on chronic diastolic heart failure, but has never been followed by cardiology.  He was in his usual state of heath until 11/26/19 when he presented to [facility] via EMS with chest pain/epigastric pain and dypsnea while en route to hemodialysis. Upon arrival to ED: Patient's vital signs stable, afebrile, no leukocytosis, maintaining oxygen saturation on room air, HS troponin 36--> 37, EKG: sinus with RBBB and no acute changes, COVID-19 negative.  CT head negative for acute findings, lipase: WNL. Chest x-ray showed pulmonary vascular congestion without pulmonary edema and mild cardiomegaly. CTA  chest/abd/pelvis: Negative for PE or aortic dissection. 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 A murmur was noted on exam prompting cardiology consultation and echo. He was cleared for colonoscopy which showed a polyp without evidence of malignancy. Echocardiogram showed preserved LV function with moderate to severe AS: V-max of 3.7 m/s, mean gradient 26 mmHg, and aortic valve area ~1.0 cm. Dimensionless index around 0.31. Stroke-volume index reported at 44 cc/m.  He is being followed by Dr. Audie Box who felt his cardiac exam revealed a late peaking systoloic murmur more c/w severe AS. Given symptoms of chest pain and exertional dyspnea structural heart evaluation with cardiac CT scanning was requested for further work up.   Today the patient is seen laying in bed.  He is a poor historian but denies chronic dyspnea.  He said he had never previously had chest pain before this admission.  He denies lower extremity edema, orthopnea or PND.  He denies significant fatigue.  He is mostly interested in when he can go home.   Past Medical History:  Diagnosis Date   Anemia    low iron   Arthritis    Cancer ([place]) ?2006   prostate   Chronic kidney disease    Stage 4   Colon polyps ~ 1993 and 2003  Dr [name], [facility].  08/2001 colonoscopy: tubular adenoma at cecum.     Diabetes mellitus without complication (HCC)    Elevated cholesterol with high triglycerides    GERD (gastroesophageal reflux disease)    Hypertension    Legally blind in left eye, as defined in [place]    has pinpoint vision in right eye    Past Surgical History:  Procedure Laterality Date   AV FISTULA PLACEMENT Left 03/20/2018   Procedure: ARTERIOVENOUS (AV) FISTULA CREATION ARM;  Surgeon: [name], MD;  Location: [place];  Service: Vascular;  Laterality: Left;   [place] Left 05/21/2018   Procedure: LEFT BASILIC VEIN FISTULA SECOND STAGE;  Surgeon: [name], MD;  Location: [place];  Service: Vascular;  Laterality: Left;   COLONOSCOPY     COLONOSCOPY WITH PROPOFOL N/A 11/30/2019   Procedure: COLONOSCOPY WITH PROPOFOL;  Surgeon: Ronnette Juniper, MD;  Location: [place];  Service: Gastroenterology;  Laterality: N/A;   GLAUCOMA SURGERY  2019   multiple surgeries   HEMOSTASIS CLIP PLACEMENT  11/30/2019   Procedure: HEMOSTASIS CLIP PLACEMENT;  Surgeon: Ronnette Juniper, MD;  Location: [facility] ENDOSCOPY;  Service: Gastroenterology;;   KNEE ARTHROSCOPY     POLYPECTOMY  11/30/2019   Procedure: POLYPECTOMY;  Surgeon: Ronnette Juniper, MD;  Location: [facility] ENDOSCOPY;  Service: Gastroenterology;;   PROSTATE SURGERY     SUBMUCOSAL TATTOO INJECTION  11/30/2019   Procedure: SUBMUCOSAL TATTOO INJECTION;  Surgeon: Ronnette Juniper, MD;  Location: [facility] ENDOSCOPY;  Service: Gastroenterology;;     Inpatient Medications: Scheduled Meds:  apraclonidine  1 drop Right Eye TID   atorvastatin  10 mg Oral Daily   carvedilol  3.125 mg Oral BID WC   Chlorhexidine Gluconate Cloth  6 each Topical Q0600   Chlorhexidine Gluconate Cloth  6 each Topical Q0600   dorzolamide-timolol  1 drop Right Eye BID   doxercalciferol  7 mcg Intravenous Q T,Th,Sa-HD   ferric citrate  210 mg Oral TID WC   gabapentin  100 mg Oral QHS   heparin  5,000 Units Subcutaneous Q8H   hydrALAZINE  25 mg Oral BID   insulin aspart  0-5 Units Subcutaneous QHS   insulin aspart  0-6 Units Subcutaneous TID WC   isosorbide mononitrate  30 mg Oral Daily   latanoprost  1 drop Right Eye QHS   multivitamin  1 tablet Oral QHS   pantoprazole  40 mg Oral Daily   pilocarpine  1 drop Right Eye QID   tamsulosin  0.4 mg Oral QHS   Continuous Infusions:  PRN Meds: acetaminophen **OR** acetaminophen,  diclofenac Sodium, hydrALAZINE, hydrocortisone cream, HYDROmorphone (DILAUDID) injection, hydroxypropyl methylcellulose / hypromellose, ondansetron **OR** ondansetron (ZOFRAN) IV  Allergies:   No Known Allergies  Social History:   Social History   Socioeconomic History   Marital status: Widowed    Spouse name: Not on file   Number of children: Not on file   Years of education: Not on file   Highest education level: Not on file  Occupational History   Occupation: retired  Tobacco Use   Smoking status: Former Smoker    Packs/day: 0.50    Quit date: 03/23/2018    Years since quitting: 1.6   Smokeless tobacco: Never Used  Vaping Use   Vaping Use: Never used  Substance and Sexual Activity   Alcohol use: No   Drug use: No   Sexual activity: Not Currently  Other Topics Concern   Not on file  Social  History Narrative   Not on file   Social Determinants of Health   Financial Resource Strain: Low Risk    Difficulty of Paying Living Expenses: Not hard at all  Food Insecurity: No Food Insecurity   Worried About Charity fundraiser in the Last Year: Never true   [place] in the Last Year: Never true  Transportation Needs: No Transportation Needs   Lack of Transportation (Medical): No   Lack of Transportation (Non-Medical): No  Physical Activity: Inactive   Days of Exercise per Week: 0 days   Minutes of Exercise per Session: 0 min  Stress: No Stress Concern Present   Feeling of Stress : Not at all  Social Connections:    Frequency of Communication with Friends and Family: Not on file   Frequency of Social Gatherings with Friends and Family: Not on file   Attends Religious Services: Not on Electrical engineer or Organizations: Not on file   Attends Archivist Meetings: Not on file   Marital Status: Not on file  Intimate Partner Violence:    Fear of Current or Ex-Partner: Not on file   Emotionally Abused: Not on file    Physically Abused: Not on file   Sexually Abused: Not on file    Family History:   The patient's family history includes CAD in his mother.  ROS:  Please see the history of present illness.  ROS  All other ROS reviewed and negative.     Physical Exam/Data:   Vitals:   12/01/19 0930 12/01/19 1000 12/01/19 1030 12/01/19 1122  BP: (!) 92/48 (!) 105/54 112/60 118/64  Pulse: 71 72 73 80  Resp:    18  Temp:   98.2 F (36.8 C) 97.8 F (36.6 C)  TempSrc:   Oral Oral  SpO2:   98% 100%  Weight:   75.1 kg   Height:        Intake/Output Summary (Last 24 hours) at 12/01/2019 1411 Last data filed at 12/01/2019 1230 Gross per 24 hour  Intake 660 ml  Output 600 ml  Net 60 ml   Filed Weights   12/01/19 0533 12/01/19 0714 12/01/19 1030  Weight: 74.7 kg 75.9 kg 75.1 kg   Body mass index is 32.33 kg/m.  General: Thin and chronically ill-appearing HEENT: normal Lymph: no adenopathy Neck: no JVD Endocrine:  No thryoid Cardiac:  normal S1, S2; RRR; 3 out of 6 systolic ejection murmur Lungs:  clear to auscultation bilaterally, no wheezing, rhonchi or rales  Abd: soft, nontender, no hepatomegaly  Ext: no edema Musculoskeletal:  No deformities, BUE and BLE strength normal and equal Skin: warm and dry  Neuro:  CNs 2-12 intact, no focal abnormalities noted Psych:  Normal affect   EKG:  The EKG was personally reviewed and demonstrates: Sinus with right bundle branch block Telemetry:  Telemetry was personally reviewed and demonstrates: Sinus  Relevant CV Studies: Echo 11/29/19 IMPRESSIONS  1. Left ventricular ejection fraction, by estimation, is 65 to 70%. The  left ventricle has normal function. The left ventricle has no regional  wall motion abnormalities. Left ventricular diastolic parameters are  indeterminate.  2. Right ventricular systolic function is normal. The right ventricular  size is normal. Tricuspid regurgitation signal is inadequate for assessing  PA pressure.  3.  The mitral valve is degenerative. Trivial mitral valve regurgitation.  No evidence of mitral stenosis. Severe mitral annular calcification.  4. The aortic valve is calcified. There  is severe calcifcation of the  aortic valve. There is severe thickening of the aortic valve. Aortic valve  regurgitation is trivial. 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  5. The inferior vena cava is normal in size with greater than 50%  respiratory variability, suggesting right atrial pressure of 3 mmHg.   FINDINGS  Left Ventricle: Left ventricular ejection fraction, by estimation, is 65  to 70%. The left ventricle has normal function. The left ventricle has no  regional wall motion abnormalities. The left ventricular internal cavity  size was normal in size. There is  no left ventricular hypertrophy. Left ventricular diastolic parameters  are indeterminate.   Right Ventricle: The right ventricular size is normal. No increase in  right ventricular wall thickness. Right ventricular systolic function is  normal. Tricuspid regurgitation signal is inadequate for assessing PA  pressure.   Left Atrium: Left atrial size was normal in size.   Right Atrium: Right atrial size was normal in size.   Pericardium: There is no evidence of pericardial effusion.   Mitral Valve: The mitral valve is degenerative in appearance. Severe  mitral annular calcification. Trivial mitral valve regurgitation. No  evidence of mitral valve stenosis.   Tricuspid Valve: The tricuspid valve is grossly normal. Tricuspid valve  regurgitation is not demonstrated.   Aortic Valve: The aortic valve is calcified. There is severe calcifcation  of the aortic valve. There is severe thickening of the aortic valve.  Aortic valve regurgitation is trivial. Moderate to severe aortic stenosis  is present. Aortic valve mean  gradient measures 26.0 mmHg. Aortic valve peak gradient measures 54.8  mmHg. Aortic valve area, by VTI measures 1.06  cm.   Pulmonic Valve: The pulmonic valve was not well visualized. Pulmonic valve  regurgitation is not visualized.   Aorta: The aortic root is normal in size and structure.   Venous: The inferior vena cava is normal in size with greater than 50%  respiratory variability, suggesting right atrial pressure of 3 mmHg.   IAS/Shunts: No atrial level shunt detected by color flow Doppler.     LEFT VENTRICLE  PLAX 2D  LVIDd:     3.90 cm Diastology  LVIDs:     2.35 cm LV e' medial:  4.57 cm/s  LV PW:     1.00 cm LV E/e' medial: 15.5  LV IVS:    0.90 cm LV e' lateral:  8.05 cm/s  LVOT diam:   2.08 cm LV E/e' lateral: 8.8  LV SV:     76  LV SV Index:  44  LVOT Area:   3.40 cm     RIGHT VENTRICLE       IVC  RV S prime:   11.00 cm/s IVC diam: 1.25 cm  TAPSE (M-mode): 2.0 cm   LEFT ATRIUM       Index    RIGHT ATRIUM     Index  LA diam:    3.40 cm 1.99 cm/m RA Area:   9.48 cm  LA Vol (A2C):  53.7 ml 31.42 ml/m RA Volume:  18.10 ml 10.59 ml/m  LA Vol (A4C):  30.6 ml 17.91 ml/m  LA Biplane Vol: 41.8 ml 24.46 ml/m  AORTIC VALVE  AV Area (Vmax):  0.89 cm  AV Area (Vmean):  0.97 cm  AV Area (VTI):   1.06 cm  AV Vmax:      370.00 cm/s  AV Vmean:     231.000 cm/s  AV VTI:      0.716 m  AV  Peak Grad:   54.8 mmHg  AV Mean Grad:   26.0 mmHg  LVOT Vmax:     96.71 cm/s  LVOT Vmean:    66.068 cm/s  LVOT VTI:     0.223 m  LVOT/AV VTI ratio: 0.31    AORTA  Ao Root diam: 3.30 cm   MITRAL VALVE  MV Area (PHT): 1.94 cm   SHUNTS  MV Decel Time: 391 msec   Systemic VTI: 0.22 m  MV E velocity: 71.00 cm/s  Systemic Diam: 2.08 cm  MV A velocity: 140.00 cm/s  MV E/A ratio: 0.51   Mihai Croitoru MD  Electronically signed by Sanda Klein MD  Signature Date/Time: 11/29/2019/8:09:10 AM     Laboratory Data:  Chemistry Recent Labs  Lab 11/28/19 0248 12-29-2019 0434  12/01/19 0512  NA 139 137 137  K 4.1 4.0 4.1  CL 100 96* 97*  CO2 25 23 24   GLUCOSE 126* 119* 119*  BUN 41* 31* 40*  CREATININE 9.36* 7.64* 9.18*  CALCIUM 9.5 10.3 9.6  GFRNONAA 5* 6* 5*  GFRAA 5* 7* 6*  ANIONGAP 14 18* 16*    Recent Labs  Lab 11/26/19 1112 11/27/19 1038 12/01/19 0512  PROT 7.0 6.0*  --   ALBUMIN 3.7 3.3* 3.1*  AST 19 41  --   ALT 15 14  --   ALKPHOS 99 72  --   BILITOT 0.5 1.3*  --    Hematology Recent Labs  Lab 11/28/19 0248 December 29, 2019 0434 12/01/19 0858  WBC 6.3 5.6 6.2  RBC 3.45* 3.98* 3.30*  HGB 11.3* 13.5 10.9*  HCT 35.0* 40.8 33.8*  MCV 101.4* 102.5* 102.4*  MCH 32.8 33.9 33.0  MCHC 32.3 33.1 32.2  RDW 13.0 12.7 12.6  PLT 208 195 228   Cardiac EnzymesNo results for input(s): TROPONINI in the last 168 hours. No results for input(s): TROPIPOC in the last 168 hours.  BNPNo results for input(s): BNP, PROBNP in the last 168 hours.  DDimer No results for input(s): DDIMER in the last 168 hours.  Radiology/Studies:  US RENAL  Result Date: 2019/12/29 CLINICAL DATA:  Right-sided hydronephrosis seen incidentally on a recent prior right upper quadrant ultrasound. EXAM: RENAL / URINARY TRACT ULTRASOUND COMPLETE COMPARISON:  Right upper quadrant ultrasound 11/26/2019; prior renal ultrasound 11/10/2013 FINDINGS: Right Kidney: Renal measurements: 10.1 x 4.9 x 3.8 cm = volume: 97.6 mL. Interval resolution of hydronephrosis. Mild diffuse renal cortical thinning with lipomatous hypertrophy of the renal pelvis. The renal parenchyma is mildly echogenic. No solid lesion identified. Left Kidney: Renal measurements: 9.2 x 4.0 x 4.2 cm = volume: 77.3 mL. No evidence of hydronephrosis. Mild diffuse renal cortical thinning with lipomatous hypertrophy of the renal sinus fat. The renal parenchyma is mildly echogenic. No focal solid lesion identified. Bladder: Foley catheter visualized within the collapsed bladder. Other: None. IMPRESSION: 1. Foley catheter present within the  bladder. 2. Interval resolution of hydronephrosis, presumably related to relief of bladder outlet obstruction. 3. Mildly echogenic kidneys bilaterally consistent with underlying medical renal disease. 4. Mild renal cortical thinning bilaterally. Electronically Signed   By: Jacqulynn Cadet M.D.   On: 2019/12/29 10:57   ECHOCARDIOGRAM COMPLETE  Result Date: 11/29/2019    ECHOCARDIOGRAM REPORT   Patient Name:   MERRILL VILLARRUEL Date of Exam: 11/28/2019 Medical Rec #:  [id number]       Height:       60.0 in Accession #:    [id number]      Weight:  162.5 lb Date of Birth:  05-26-1940       BSA:          1.709 m Patient Age:    26 years        BP:           155/71 mmHg Patient Gender: M               HR:           76 bpm. Exam Location:  Inpatient Procedure: 2D Echo Indications:    chest pain 786.50  History:        Patient has prior history of Echocardiogram examinations, most                 recent 09/02/2017. End stage renal disease, Aortic Valve Disease;                 Risk Factors:Diabetes and Dyslipidemia.  Sonographer:    Johny Chess Referring Phys: [id number] Champ Mungo TAYLOR  Sonographer Comments: Image acquisition challenging due to respiratory motion. IMPRESSIONS  1. Left ventricular ejection fraction, by estimation, is 65 to 70%. The left ventricle has normal function. The left ventricle has no regional wall motion abnormalities. Left ventricular diastolic parameters are indeterminate.  2. Right ventricular systolic function is normal. The right ventricular size is normal. Tricuspid regurgitation signal is inadequate for assessing PA pressure.  3. The mitral valve is degenerative. Trivial mitral valve regurgitation. No evidence of mitral stenosis. Severe mitral annular calcification.  4. The aortic valve is calcified. There is severe calcifcation of the aortic valve. There is severe thickening of the aortic valve. Aortic valve regurgitation is trivial. 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  5. The  inferior vena cava is normal in size with greater than 50% respiratory variability, suggesting right atrial pressure of 3 mmHg. FINDINGS  Left Ventricle: Left ventricular ejection fraction, by estimation, is 65 to 70%. The left ventricle has normal function. The left ventricle has no regional wall motion abnormalities. The left ventricular internal cavity size was normal in size. There is  no left ventricular hypertrophy. Left ventricular diastolic parameters are indeterminate. Right Ventricle: The right ventricular size is normal. No increase in right ventricular wall thickness. Right ventricular systolic function is normal. Tricuspid regurgitation signal is inadequate for assessing PA pressure. Left Atrium: Left atrial size was normal in size. Right Atrium: Right atrial size was normal in size. Pericardium: There is no evidence of pericardial effusion. Mitral Valve: The mitral valve is degenerative in appearance. Severe mitral annular calcification. Trivial mitral valve regurgitation. No evidence of mitral valve stenosis. Tricuspid Valve: The tricuspid valve is grossly normal. Tricuspid valve regurgitation is not demonstrated. Aortic Valve: The aortic valve is calcified. There is severe calcifcation of the aortic valve. There is severe thickening of the aortic valve. Aortic valve regurgitation is trivial. Moderate to severe aortic stenosis is present. Aortic valve mean gradient measures 26.0 mmHg. Aortic valve peak gradient measures 54.8 mmHg. Aortic valve area, by VTI measures 1.06 cm. Pulmonic Valve: The pulmonic valve was not well visualized. Pulmonic valve regurgitation is not visualized. Aorta: The aortic root is normal in size and structure. Venous: The inferior vena cava is normal in size with greater than 50% respiratory variability, suggesting right atrial pressure of 3 mmHg. IAS/Shunts: No atrial level shunt detected by color flow Doppler.  LEFT VENTRICLE PLAX 2D LVIDd:         3.90 cm  Diastology  LVIDs:  2.35 cm  LV e' medial:    4.57 cm/s LV PW:         1.00 cm  LV E/e' medial:  15.5 LV IVS:        0.90 cm  LV e' lateral:   8.05 cm/s LVOT diam:     2.08 cm  LV E/e' lateral: 8.8 LV SV:         76 LV SV Index:   44 LVOT Area:     3.40 cm  RIGHT VENTRICLE             IVC RV S prime:     11.00 cm/s  IVC diam: 1.25 cm TAPSE (M-mode): 2.0 cm LEFT ATRIUM             Index       RIGHT ATRIUM          Index LA diam:        3.40 cm 1.99 cm/m  RA Area:     9.48 cm LA Vol (A2C):   53.7 ml 31.42 ml/m RA Volume:   18.10 ml 10.59 ml/m LA Vol (A4C):   30.6 ml 17.91 ml/m LA Biplane Vol: 41.8 ml 24.46 ml/m  AORTIC VALVE AV Area (Vmax):    0.89 cm AV Area (Vmean):   0.97 cm AV Area (VTI):     1.06 cm AV Vmax:           370.00 cm/s AV Vmean:          231.000 cm/s AV VTI:            0.716 m AV Peak Grad:      54.8 mmHg AV Mean Grad:      26.0 mmHg LVOT Vmax:         96.71 cm/s LVOT Vmean:        66.068 cm/s LVOT VTI:          0.223 m LVOT/AV VTI ratio: 0.31  AORTA Ao Root diam: 3.30 cm MITRAL VALVE MV Area (PHT): 1.94 cm     SHUNTS MV Decel Time: 391 msec     Systemic VTI:  0.22 m MV E velocity: 71.00 cm/s   Systemic Diam: 2.08 cm MV A velocity: 140.00 cm/s MV E/A ratio:  0.51 Mihai Croitoru MD Electronically signed by Sanda Klein MD Signature Date/Time: 11/29/2019/8:09:10 AM    Final      STS Risk Calculator:  Procedure: Isolated AVR Risk of Mortality: 4.160% Renal Failure: NA Permanent Stroke: 2.342% Prolonged Ventilation: 11.722% DSW Infection: 0.185% Reoperation: 3.991% Morbidity or Mortality: 20.422% Short Length of Stay: 15.658% Long Length of Stay: 9.915%  ________________________   [facility] Cardiomyopathy Questionnaire  KCCQ-12 12/01/2019  1 a. Ability to shower/bathe Not at all limited  1 b. Ability to walk 1 block Extremely limited  1 c. Ability to hurry/jog Other, Did not do  2. Edema feet/ankles/legs Less than once a week  3. Limited by fatigue Never over the  past 2 weeks  4. Limited by dyspnea Several times a day  5. Sitting up / on 3+ pillows Never over the past 2 weeks  6. Limited enjoyment of life Slightly limited  7. Rest of life w/ symptoms Mostly satisfied  8 a. Participation in hobbies Slightly limited  8 b. Participation in chores Slightly limited  8 c. Visiting family/friends N/A, did not do for other reasons     Assessment and Plan:   JHASE CREPPEL is a 79 y.o. male with  symptoms of severe, stage D aortic stenosis with NYHA Class II symptoms. I have reviewed the patient's recent echocardiogram which is notable for preserved LV systolic function (67-67%) and moderate to severe aortic stenosis with peak gradient of 54.8 mm hg and mean transvalvular gradient of 26.0. The patient's dimensionless index is 0.31 and calculated aortic valve area is 0.89 cm. SVI 44 cc/m2.  Although echo criteria is more in the moderate range, his physical exam is more consistent with severe aortic stenosis and there is no other good explanation for his symptoms prompting admission.  For this reason, we will plan to proceed with CT angiography as well as cardiac CT with calcium scoring to gather more information about the severity of his aortic stenosis.  If CT scanning confirms severe AS, we may want to keep him for heart catheterization during this admission.   I have reviewed the natural history of aortic stenosis with the patient. We have discussed the limitations of medical therapy and the poor prognosis associated with symptomatic aortic stenosis. We have reviewed potential treatment options, including palliative medical therapy, conventional surgical aortic valve replacement, and transcatheter aortic valve replacement. We discussed treatment options in the context of this patient's specific comorbid medical conditions.    The patient's predicted risk of mortality with conventional aortic valve replacement is 4.16% primarily based on age, end-stage renal disease  on hemodialysis, diabetes, and hypertension. Other significant comorbid conditions include poor physical mobility and deconditioning. TAVR seems like a reasonable treatment option for this patient pending formal cardiac surgical consultation. We discussed typical evaluation which will require a gated cardiac CTA and a CTA of the chest/abdomen/pelvis to evaluate both his cardiac anatomy and peripheral vasculature. Follow-up testing will be arranged as an outpatient.  Additionally, the patient has poor dentition and will likely need to be seen by a dentist in the outpatient setting.   Dr. [name] to follow.   Signed, [name], PA-C  12/01/2019 2:11 PM   I have personally seen and examined this patient. I agree with the assessment and plan as outlined above.  Mr. Durnil is a 79 yo male with legal blindness,  ESRD on HD, DM, chronic anemia, HTN, HLD and GERD who was admitted with progressive dyspnea and chest pain. Echo showed preserved LV systolic function. The aortic valve leaflets are thickened and calcified. There is moderately severe to severe aortic stenosis. Mean gradient 26 mmHg, peak gradient 54.8 mmHg, AVA 0.89 cm2, dimensionless index 0.31.  I have personally reviewed the echo images.   Labs reviewed by me.  EKG reviewed by me and shows sinus rhythm with RBBB   My exam:  General: Well developed, well nourished, NAD  HEENT: OP clear, mucus membranes moist  SKIN: warm, dry. No rashes.  Neuro: No focal deficits  Musculoskeletal: Muscle strength 5/5 all ext  Psychiatric: Mood and affect normal  Neck: No JVD, no carotid bruits, no thyromegaly, no lymphadenopathy.  Lungs:Clear bilaterally, no wheezes, rhonci, crackles  Cardiovascular: Regular rate and rhythm. Harsh, late peaking systolic murmur.  Abdomen:Soft. Bowel sounds present. Non-tender.  Extremities: No lower extremity edema. Pulses are 2 + in the bilateral DP/PT.   Severe aortic valve stenosis: He has moderately severe to  severe aortic valve stenosis. I have personally reviewed the echo images. The aortic valve is thickened, calcified with limited leaflet mobility. His presentation may be due to his valve disease. I think he would benefit from AVR. Given advanced age, he is not a good candidate for conventional AVR  by surgical approach. I think he may be a good candidate for TAVR.  I have reviewed the natural history of aortic stenosis with the patient and their family members who are present today. We have discussed the limitations of medical therapy and the poor prognosis associated with symptomatic aortic stenosis. We have reviewed potential treatment options, including palliative medical therapy, conventional surgical aortic valve replacement, and transcatheter aortic valve replacement. We discussed treatment options in the context of the patient's specific comorbid medical conditions.  He would like to proceed with planning for TAVR. We will plan to get a gated cardiac CTA to better assess his aortic valve. If he AS is felt to be severe, he will need a right and left heart catheterization prior to discharge. Risks and benefits of the valve procedure reviewed with the patient.    Lauree Chandler  12/01/2019 5:26 PM

## 2019-12-01 NOTE — Progress Notes (Signed)
Cardiology Progress Note  Patient ID: Elijah White MRN: [id number] DOB: [birth year] Date of Encounter: 12/01/2019  Primary Cardiologist: No primary care provider on file.  Subjective   Chief Complaint: No shortness of breath reported. Colonic polyps found.  HPI: Denies symptoms. Colonic polyps found.  ROS:  All other ROS reviewed and negative. Pertinent positives noted in the HPI.     Inpatient Medications  Scheduled Meds: . apraclonidine  1 drop Right Eye TID  . atorvastatin  10 mg Oral Daily  . carvedilol  3.125 mg Oral BID WC  . Chlorhexidine Gluconate Cloth  6 each Topical Q0600  . Chlorhexidine Gluconate Cloth  6 each Topical Q0600  . dorzolamide-timolol  1 drop Right Eye BID  . doxercalciferol  7 mcg Intravenous Q T,Th,Sa-HD  . ferric citrate  210 mg Oral TID WC  . gabapentin  100 mg Oral QHS  . heparin  5,000 Units Subcutaneous Q8H  . hydrALAZINE  25 mg Oral BID  . insulin aspart  0-5 Units Subcutaneous QHS  . insulin aspart  0-6 Units Subcutaneous TID WC  . isosorbide mononitrate  30 mg Oral Daily  . latanoprost  1 drop Right Eye QHS  . multivitamin  1 tablet Oral QHS  . pantoprazole  40 mg Oral Daily  . pilocarpine  1 drop Right Eye QID  . tamsulosin  0.4 mg Oral QHS   Continuous Infusions:  PRN Meds: acetaminophen **OR** acetaminophen, diclofenac Sodium, hydrALAZINE, hydrocortisone cream, HYDROmorphone (DILAUDID) injection, hydroxypropyl methylcellulose / hypromellose, ondansetron **OR** ondansetron (ZOFRAN) IV   Vital Signs   Vitals:   12/01/19 0930 12/01/19 1000 12/01/19 1030 12/01/19 1122  BP: (!) 92/48 (!) 105/54 112/60 118/64  Pulse: 71 72 73 80  Resp:    18  Temp:   98.2 F (36.8 C) 97.8 F (36.6 C)  TempSrc:   Oral Oral  SpO2:   98% 100%  Weight:   75.1 kg   Height:        Intake/Output Summary (Last 24 hours) at 12/01/2019 1156 Last data filed at 12/01/2019 1030 Gross per 24 hour  Intake 840 ml  Output 750 ml  Net 90 ml   Last 3  Weights 12/01/2019 12/01/2019 12/01/2019  Weight (lbs) 165 lb 9.1 oz 167 lb 5.3 oz 164 lb 10.9 oz  Weight (kg) 75.1 kg 75.9 kg 74.7 kg      Telemetry  Overnight telemetry shows sinus rhythm heart rate in the 70s, which I personally reviewed.   ECG  The most recent ECG shows sinus rhythm, right bundle branch block, which I personally reviewed.   Physical Exam   Vitals:   12/01/19 0930 12/01/19 1000 12/01/19 1030 12/01/19 1122  BP: (!) 92/48 (!) 105/54 112/60 118/64  Pulse: 71 72 73 80  Resp:    18  Temp:   98.2 F (36.8 C) 97.8 F (36.6 C)  TempSrc:   Oral Oral  SpO2:   98% 100%  Weight:   75.1 kg   Height:         Intake/Output Summary (Last 24 hours) at 12/01/2019 1156 Last data filed at 12/01/2019 1030 Gross per 24 hour  Intake 840 ml  Output 750 ml  Net 90 ml    Last 3 Weights 12/01/2019 12/01/2019 12/01/2019  Weight (lbs) 165 lb 9.1 oz 167 lb 5.3 oz 164 lb 10.9 oz  Weight (kg) 75.1 kg 75.9 kg 74.7 kg    Body mass index is 32.33 kg/m.  General: Well nourished, well  developed, in no acute distress Head: Atraumatic, normal size  Eyes: PEERLA, EOMI  Neck: Supple, no JVD Endocrine: No thryomegaly Cardiac: Normal S1, S2; 3-6 systolic ejection murmur, late peaking, blunted S2, radiates into carotids Lungs: Clear to auscultation bilaterally, no wheezing, rhonchi or rales  Abd: Soft, nontender, no hepatomegaly  Ext: No edema, pulses 2+ Musculoskeletal: No deformities, BUE and BLE strength normal and equal Skin: Warm and dry, no rashes   Neuro: Alert and oriented to person, place, time, and situation, CNII-XII grossly intact, no focal deficits  Psych: Normal mood and affect   Labs  High Sensitivity Troponin:   Recent Labs  Lab 11/26/19 1112 11/26/19 1312 11/26/19 1816  TROPONINIHS 36* 37* 41*     Cardiac EnzymesNo results for input(s): TROPONINI in the last 168 hours. No results for input(s): TROPIPOC in the last 168 hours.  Chemistry Recent Labs  Lab 11/26/19 1112  11/26/19 1816 11/27/19 1038 11/27/19 1038 11/28/19 0248 11/30/19 0434 12/01/19 0512  NA 140  --  139   < > 139 137 137  K 4.1  --  6.0*   < > 4.1 4.0 4.1  CL 100  --  100   < > 100 96* 97*  CO2 29  --  24   < > 25 23 24   GLUCOSE 199*  --  132*   < > 126* 119* 119*  BUN 27*  --  34*   < > 41* 31* 40*  CREATININE 6.92*   < > 7.96*   < > 9.36* 7.64* 9.18*  CALCIUM 9.8  --  9.3   < > 9.5 10.3 9.6  PROT 7.0  --  6.0*  --   --   --   --   ALBUMIN 3.7  --  3.3*  --   --   --  3.1*  AST 19  --  41  --   --   --   --   ALT 15  --  14  --   --   --   --   ALKPHOS 99  --  72  --   --   --   --   BILITOT 0.5  --  1.3*  --   --   --   --   GFRNONAA 7*   < > 6*   < > 5* 6* 5*  GFRAA 8*   < > 7*   < > 5* 7* 6*  ANIONGAP 11  --  15   < > 14 18* 16*   < > = values in this interval not displayed.    Hematology Recent Labs  Lab 11/28/19 0248 11/30/19 0434 12/01/19 0858  WBC 6.3 5.6 6.2  RBC 3.45* 3.98* 3.30*  HGB 11.3* 13.5 10.9*  HCT 35.0* 40.8 33.8*  MCV 101.4* 102.5* 102.4*  MCH 32.8 33.9 33.0  MCHC 32.3 33.1 32.2  RDW 13.0 12.7 12.6  PLT 208 195 228   BNPNo results for input(s): BNP, PROBNP in the last 168 hours.  DDimer No results for input(s): DDIMER in the last 168 hours.   Radiology  US RENAL  Result Date: 11/30/2019 CLINICAL DATA:  Right-sided hydronephrosis seen incidentally on a recent prior right upper quadrant ultrasound. EXAM: RENAL / URINARY TRACT ULTRASOUND COMPLETE COMPARISON:  Right upper quadrant ultrasound 11/26/2019; prior renal ultrasound 11/10/2013 FINDINGS: Right Kidney: Renal measurements: 10.1 x 4.9 x 3.8 cm = volume: 97.6 mL. Interval resolution of hydronephrosis. Mild diffuse renal cortical thinning with  lipomatous hypertrophy of the renal pelvis. The renal parenchyma is mildly echogenic. No solid lesion identified. Left Kidney: Renal measurements: 9.2 x 4.0 x 4.2 cm = volume: 77.3 mL. No evidence of hydronephrosis. Mild diffuse renal cortical thinning with  lipomatous hypertrophy of the renal sinus fat. The renal parenchyma is mildly echogenic. No focal solid lesion identified. Bladder: Foley catheter visualized within the collapsed bladder. Other: None. IMPRESSION: 1. Foley catheter present within the bladder. 2. Interval resolution of hydronephrosis, presumably related to relief of bladder outlet obstruction. 3. Mildly echogenic kidneys bilaterally consistent with underlying medical renal disease. 4. Mild renal cortical thinning bilaterally. Electronically Signed   By: Jacqulynn Cadet M.D.   On: 11/30/2019 10:57    Cardiac Studies  TTE 11/28/2019 1. Left ventricular ejection fraction, by estimation, is 65 to 70%. The  left ventricle has normal function. The left ventricle has no regional  wall motion abnormalities. Left ventricular diastolic parameters are  indeterminate.  2. Right ventricular systolic function is normal. The right ventricular  size is normal. Tricuspid regurgitation signal is inadequate for assessing  PA pressure.  3. The mitral valve is degenerative. Trivial mitral valve regurgitation.  No evidence of mitral stenosis. Severe mitral annular calcification.  4. The aortic valve is calcified. There is severe calcifcation of the  aortic valve. There is severe thickening of the aortic valve. Aortic valve  regurgitation is trivial. 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  5. The inferior vena cava is normal in size with greater than 50%  respiratory variability, suggesting right atrial pressure of 3 mmHg.   Patient Profile  Elijah White is a 79 y.o. male with ESRD on hemodialysis, hypertension, diabetes, HFpEF who was admitted on 11/26/2019 with epigastric pain, chest pain and shortness of breath. Echocardiogram with concerns for moderate to severe restenosis.  Assessment & Plan   1. Chest pain/shortness of breath, likely severe aortic stenosis -Admitted with a constellation of symptoms. They occur at rest. He is never  had these before. I discussed this with he and his daughter. There was concerns for possible colonic mass. Colonoscopy showing this is just a polyp. This is benign. No concerns for malignancy. -His cardiac exam is consistent with a late peaking systolic ejection murmur consistent with severe aortic stenosis. His echocardiogram shows a V-max of 3.7 m/s, mean gradient 26 mmHg, and aortic valve area ~1.0 cm. Dimensionless index around 0.31. Stroke-volume index reported at 44 cc/m. Values obtained on echocardiogram are considered moderate range. -I did discuss with he and his daughter that his symptoms of shortness of breath and chest pain could have been related to his aortic valve disease. He will need further assessment. We did discuss with the structural heart evaluation while he is here. Cardiac CT would be helpful for AoV calcium score. He is in agreement with this. I also talked with his daughter about this. She would like to hear more about what this would entail. He may be able to go home after an initial consult.  For questions or updates, please contact [place] Please consult [email or web address] for contact info under   Time Spent with Patient: I have spent a total of 25 minutes with patient reviewing hospital notes, telemetry, EKGs, labs and examining the patient as well as establishing an assessment and plan that was discussed with the patient.  > 50% of time was spent in direct patient care.    Signed, Addison Naegeli. Audie Box, [place]  12/01/2019 11:56 AM

## 2019-12-01 NOTE — Progress Notes (Signed)
Physical Therapy Treatment Patient Details Name: Elijah White MRN: [id number] DOB: [birth year] Today's Date: 12/01/2019    History of Present Illness Elijah White is a 79 y.o. male with medical history significant of ESRD on dialysis (TTS), hypertension, hyperlipidemia, diabetes mellitus, GERD, legally blind in left eye, chronic diastolic CHF presented to the emergency department for evaluation of chest pain/epigastric pain, shortness of breath and generalized weakness for 1 day.  Work up to r/o cardiac vs GI cause.  Colonoscopy pending.    PT Comments    Pt fully participated in session; pt timid during ambulation due to vision and unfamiliar environment; pt demonstrating improved gait tolerance during session; pt continues to require min A for bed mobility, transfers and safety during gait; pt will benefit from skilled PT to address deficits in balance, strength, coordination, gait and endurance to maximize independence with functional mobility prior to discharge.     Follow Up Recommendations  SNF;Supervision - Intermittent;Supervision/Assistance - 24 hour;Other progressing to home     Equipment Recommendations  None recommended by PT;Other (comment)    Recommendations for Other Services       Precautions / Restrictions Precautions Precautions: Fall Precaution Comments: legally blind. Restrictions Weight Bearing Restrictions: No    Mobility  Bed Mobility Overal bed mobility: Needs Assistance Bed Mobility: Supine to Sit     Supine to sit: Min assist;HOB elevated        Transfers Overall transfer level: Needs assistance Equipment used: Rolling walker (2 wheeled) Transfers: Sit to/from Stand Sit to Stand: Min assist         General transfer comment: tactile direction for low vision.  Assist to come forward and up.  Ambulation/Gait Ambulation/Gait assistance: Min assist Gait Distance (Feet): 60 Feet Assistive device: Rolling walker (2 wheeled)   Gait  velocity: slow   General Gait Details: short tentative steps due to unfamiliar environment and low vision; pt constantly requesting therapist to guide the RW; verbal cueing to increase step length B, verbal cueing to improve positioning and alignment inside RW during turns; HHA ambulation in tight areas with patient reaching for furniture   Stairs             Wheelchair Mobility    Modified Rankin (Stroke Patients Only)       Balance Overall balance assessment: Independent Sitting-balance support: No upper extremity supported;Feet supported Sitting balance-Leahy Scale: Good     Standing balance support: No upper extremity supported Standing balance-Leahy Scale: Fair Standing balance comment: pt able to release support once standing for approximately 5 seconds with SBA from therapist                            Cognition Arousal/Alertness: Awake/alert Behavior During Therapy: Flat affect Overall Cognitive Status: Within Functional Limits for tasks assessed                                        Exercises General Exercises - Lower Extremity Long Arc Quad: AROM;Both;20 reps;Seated Hip Flexion/Marching: AROM;Both;20 reps;Seated    General Comments        Pertinent Vitals/Pain Pain Assessment: No/denies pain    Home Living                      Prior Function            PT Goals (  current goals can now be found in the care plan section) Acute Rehab PT Goals Patient Stated Goal: get home. PT Goal Formulation: With patient Time For Goal Achievement: 12/12/19 Potential to Achieve Goals: Good Progress towards PT goals: Progressing toward goals    Frequency    Min 3X/week      PT Plan Current plan remains appropriate    Co-evaluation              AM-PAC PT "6 Clicks" Mobility   Outcome Measure  Help needed turning from your back to your side while in a flat bed without using bedrails?: A Little Help needed  moving from lying on your back to sitting on the side of a flat bed without using bedrails?: A Little Help needed moving to and from a bed to a chair (including a wheelchair)?: A Little Help needed standing up from a chair using your arms (e.g., wheelchair or bedside chair)?: A Little Help needed to walk in hospital room?: A Little Help needed climbing 3-5 steps with a railing? : A Lot 6 Click Score: 17    End of Session Equipment Utilized During Treatment: Gait belt Activity Tolerance: Patient tolerated treatment well Patient left: in chair;with call bell/phone within reach;with nursing/sitter in room Nurse Communication: Mobility status PT Visit Diagnosis: Unsteadiness on feet (R26.81);Muscle weakness (generalized) (M62.81);Difficulty in walking, not elsewhere classified (R26.2)     Time: 4158-3094 PT Time Calculation (min) (ACUTE ONLY): 21 min  Charges:  $Gait Training: 8-22 mins                     Lyanne Co, DPT Acute Rehabilitation Services [phone]   [place] 12/01/2019, 12:39 PM

## 2019-12-01 NOTE — Progress Notes (Signed)
[facility] Progress Note   Dialysis Orders: TTS [place] 4 hr EDW 76 2 K 2 Ca 400/800 left upper AVF  no heparin no Fe no ESA hectorol 7 Recent labs: hgb 12 iPTH 300s Ca/P controlled - 2 [place] ac; very compliant with treatments   Assessment/Plan: 1. 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  Seen by cardiology, min ^troponin, no w/u needed.  2. Abd pain - PPI started, ^total bil noted, US showed GB distension with stone wedged at GB neck no acute cholecystitis. Per pmd.  3. Colon mass - s/p colonoscopy per GI.  Multiple polyps were removed and path sent 4. 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 Pt started HD Feb 2021, but no recent imaging in Epic.  Replaced foley and renal US repeat with resolution of hydronephrosis/improvement in bladder outlet obstruction.  Continue foley and have started flomax 9/27.  If discharge would need follow-up with urology after discharge.  5. ESRD -  TTS HD. Renal panel ordered.  6. Hypertension/volume  - controlled; new EDW at d/c 7. Anemia  -no ESA/ IV Fe ; note colon mass 8. 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  Continue auryxia. Check phos 10. 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   Disposition per primary team     Subjective:    Last HD on 9/25 with 1.5 kg UF.  Feels ok today.  He asks if he's able to go home today and I asked him to discuss with primary team.  He states he sometimes makes "quite a bit of urine"  Review of systems:   Denies shortness of breath or chest pain No n/v   Objective Vitals:   11/30/19 1952 11/30/19 1957 12/01/19 0109 12/01/19 0533  BP: (!) 92/54 108/62 118/70 114/60  Pulse: 82 81  72  Resp:  18   18  Temp: 98.4 F (36.9 C)   98.2 F (36.8 C)  TempSrc: Oral   Oral  SpO2: 99% 99%  100%  Weight:    74.7 kg  Height:       Physical Exam   General: adult male in bed in NAD Heart: S1S2 no rub Lungs: clear and unlabored on room air  Abdomen: soft NT Extremities: no LE edema  Neuro - alert and oriented x3 provides hx and follows commands; blind  Psych normal mood and affect Dialysis Access: left upper AVF +bruit and thrill   Additional Objective Labs: Basic Metabolic Panel: Recent Labs  Lab 11/27/19 1038 11/28/19 0248 11/30/19 0434  NA 139 139 137  K 6.0* 4.1 4.0  CL 100 100 96*  CO2 24 25 23   GLUCOSE 132* 126* 119*  BUN 34* 41* 31*  CREATININE 7.96* 9.36* 7.64*  CALCIUM 9.3 9.5 10.3   Liver Function Tests: Recent Labs  Lab 11/26/19 1112 11/27/19 1038  AST 19 41  ALT 15 14  ALKPHOS 99 72  BILITOT 0.5 1.3*  PROT 7.0 6.0*  ALBUMIN 3.7 3.3*   Recent Labs  Lab 11/26/19 1112  LIPASE 29   CBC: Recent Labs  Lab 11/26/19 1112 11/26/19 1112 11/26/19 1816 11/26/19 1816 11/27/19 1038 11/28/19 0248 11/30/19 0434  WBC 7.6   < >  6.2   < > 7.0 6.3 5.6  NEUTROABS 4.0  --   --   --   --   --   --   HGB 11.7*   < > 12.1*   < > 12.1* 11.3* 13.5  HCT 36.8*   < > 36.9*   < > 36.6* 35.0* 40.8  MCV 104.8*  --  102.5*  --  103.1* 101.4* 102.5*  PLT 194   < > 209   < > 214 208 195   < > = values in this interval not displayed.   Blood Culture    Component Value Date/Time   SDES URINE, RANDOM 06/05/2007 0039   SPECREQUEST NONE 06/05/2007 0039   CULT NO GROWTH 06/05/2007 0039   REPTSTATUS 06/06/2007 FINAL 06/05/2007 0039    Cardiac Enzymes: No results for input(s): CKTOTAL, CKMB, CKMBINDEX, TROPONINI in the last 168 hours. CBG: Recent Labs  Lab 11/30/19 0558 11/30/19 1122 11/30/19 1630 11/30/19 2110 12/01/19 0610  GLUCAP 103* 110* 187* 189* 114*   Iron Studies: No results for input(s): IRON, TIBC, TRANSFERRIN, FERRITIN in the last 72 hours. Lab  Results  Component Value Date   INR 1.0 11/26/2019   INR 1.11 03/25/2018   Studies/Results: US RENAL  Result Date: 11/30/2019 CLINICAL DATA:  Right-sided hydronephrosis seen incidentally on a recent prior right upper quadrant ultrasound. EXAM: RENAL / URINARY TRACT ULTRASOUND COMPLETE COMPARISON:  Right upper quadrant ultrasound 11/26/2019; prior renal ultrasound 11/10/2013 FINDINGS: Right Kidney: Renal measurements: 10.1 x 4.9 x 3.8 cm = volume: 97.6 mL. Interval resolution of hydronephrosis. Mild diffuse renal cortical thinning with lipomatous hypertrophy of the renal pelvis. The renal parenchyma is mildly echogenic. No solid lesion identified. Left Kidney: Renal measurements: 9.2 x 4.0 x 4.2 cm = volume: 77.3 mL. No evidence of hydronephrosis. Mild diffuse renal cortical thinning with lipomatous hypertrophy of the renal sinus fat. The renal parenchyma is mildly echogenic. No focal solid lesion identified. Bladder: Foley catheter visualized within the collapsed bladder. Other: None. IMPRESSION: 1. Foley catheter present within the bladder. 2. Interval resolution of hydronephrosis, presumably related to relief of bladder outlet obstruction. 3. Mildly echogenic kidneys bilaterally consistent with underlying medical renal disease. 4. Mild renal cortical thinning bilaterally. Electronically Signed   By: Jacqulynn Cadet M.D.   On: 11/30/2019 10:57   Medications:  . apraclonidine  1 drop Right Eye TID  . atorvastatin  10 mg Oral Daily  . carvedilol  3.125 mg Oral BID WC  . Chlorhexidine Gluconate Cloth  6 each Topical Q0600  . Chlorhexidine Gluconate Cloth  6 each Topical Q0600  . dorzolamide-timolol  1 drop Right Eye BID  . doxercalciferol  7 mcg Intravenous Q T,Th,Sa-HD  . ferric citrate  210 mg Oral TID WC  . gabapentin  100 mg Oral QHS  . heparin  5,000 Units Subcutaneous Q8H  . hydrALAZINE  25 mg Oral BID  . insulin aspart  0-5 Units Subcutaneous QHS  . insulin aspart  0-6 Units  Subcutaneous TID WC  . isosorbide mononitrate  30 mg Oral Daily  . latanoprost  1 drop Right Eye QHS  . multivitamin  1 tablet Oral QHS  . pantoprazole  40 mg Oral Daily  . pilocarpine  1 drop Right Eye QID  . tamsulosin  0.4 mg Oral QHS    Claudia Desanctis, MD 12/01/2019  7:18 AM

## 2019-12-01 NOTE — Progress Notes (Signed)
PT Cancellation Note  Patient Details Name: Elijah White MRN: [id number] DOB: February 24, 1941   Cancelled Treatment:     pt in HD; will attempt later, time permitting  Lyanne Co, DPT Acute Rehabilitation Services [phone]   [place] 12/01/2019, 9:56 AM

## 2019-12-01 NOTE — Progress Notes (Signed)
PROGRESS NOTE  Elijah White XHB:[id number] DOB: [birth year] DOA: 11/26/2019 PCP: [name], MD   LOS: 5 days   Brief narrative: As per HPI,  Elijah White is a 79 y.o. male with medical history significant of ESRD on dialysis (TTS), hypertension, hyperlipidemia, diabetes mellitus, GERD, legally blind in left eye, chronic diastolic CHF presented to the emergency department for evaluation of chest pain/epigastric pain, shortness of breath and generalized weakness for 1 day.  Patient's daughter was driving him to dialysis unit when he developed chest pain so he was brought into the hospital he also had shortness of breath.  Patient had also lost 10 to 12 pounds in last 55-month since he has been started on dialysis.  Patient daughter stated that he lives alone at home and he is independent on daily life activities.  She is concerned as patient is legally blind from left eye and head recent fall with bruises on his arm and is concerned about safety at home.  Upon arrival to ED: Patient's vital signs stable, afebrile, no leukocytosis, maintaining oxygen saturation on room air, initial troponin 36 trended up to 37, EKG: No acute changes, COVID-19 negative.  CT head negative for acute findings, lipase: WNL.  CTA chest: Negative for PE or aortic dissection.  CT was concerning for 2.3 cm in size transverse colon mass, 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.  Chest x-ray showed pulmonary vascular congestion without pulmonary edema, mild cardiomegaly.  EDP consulted nephrology.  Patient was then admitted to hospital for further work-up and treatment.  Assessment/Plan:  Principal Problem:   Chest pain Active Problems:   Type 2 diabetes mellitus with renal manifestations (HCC)   Essential hypertension   GERD (gastroesophageal reflux disease)   HLD (hyperlipidemia)   ESRD (end stage renal disease) (HCC)   Macrocytic anemia   Adrenal mass (HCC)   Colonic mass  Chest pain/epigastric pain, shortness of  breath. Improved since admission.. Chest x-ray showed some pulmonary vascular congestion, mild cardiomegaly.  There is a possibility of biliary colic, atypical pain.  Abdominal utrasound shows possible stone in the gallbladder. Continue PPI.  Troponin borderline elevated but in the context of ESRD.  EKG was unremarkable.  Spoke with cardiology today.  Cardiology believes that the aortic stenosis is severe and wishes to proceed with further evaluation for TAVR.  Cholelithiasis without cholecystitis..  Conservative treatment at this time.     ESRD on hemodialysis: Tuesday, Thursday, Saturday schedule as outpatient.  Nephrology on board for hemodialysis.  Bilateral mild hydronephrosis, could be prostate enlargement related.  After placement of Foley hydronephrosis improved.  Has been started on tamsulosin.  Will discontinue Foley catheter.  Transverse colon mass: 2.3 cm in size CT scan..  Incidental finding.  Patient did have colonoscopy in 2009 and polyps were removed.  Patient underwent colonoscopic evaluation 11/30/2019 with findings of triple polyps which were biopsied.  Pending biopsy.  Likely to be benign.   Essential hypertension: Stable. Continue Coreg, hydralazine, Imdur.     Diabetes mellitus type II: Continue sliding scale insulin, Accu-Cheks, diabetic diet.  Last POC glucose of 141   GERD: Continue PPI   Diabetic neuropathy: Continue gabapentin   Chronic diastolic CHF: Compensated at the time.  Continue Imdur, statin, Coreg. 2D echocardiogram from 09/02/2017 which showed ejection fraction of 65 to 70% and grade 1 diastolic dysfunction.  Continue intake and output charting, daily weights. 2 D echocardiogram showed moderate to severe aortic stenosis but clinically significant aortic stenosis.  Cardiology on board.  Hyperlipidemia: Continue statin   Macrocytic anemia: TSH of 0.8.  Vitamin B-12 level elevated.  Folate level within normal limits.   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    Likely  to be benign finding.  Does not have symptoms or signs of cushing syndrome, gonadotropin excess or problems with blood pressure. He does not wish to proceed with invasive workup and would like to follow-up with primary care as outpatient.    Distended gallbladder with small gallstones: No evidence of cholecystitis on CT scan of the chest.  Right upper quadrant ultrasound shows cholelithiasis without acute evidence for cholecystitis.  There is possibility of stone at the gallbladder neck.   Lipase within normal range.  No further pain in the hospital.    Debility, deconditioning, legal blindness.  Recent fall at home.  History of glaucoma. Patient lives alone.  Safety concern due to recent fall at home.  Patient's daughter wishes skilled nursing facility on discharge but patient is adamant about going home.  I had a long discussion with the patient's daughter about it.  We will alert transition of care regarding the need for home health services on discharge.  DVT prophylaxis: heparin injection 5,000 Units Start: 11/26/19 1600 SCDs Start: 11/26/19 1554    Code Status: DNR  Family Communication:  None today. I spoke with the patient's daughter on the phone yesterday and updated about the clinical condition of the patient  Status is: Inpatient  Remains inpatient appropriate because:Inpatient level of care appropriate due to severity of illness and status post Colonoscopic evaluation, significant aortic stenosis for TAVR evaluation.  Dispo: The patient is from: Home              Anticipated d/c is to: Home with home health.  Patient strongly refuses skilled nursing facility placement              Anticipated d/c date is: 2 to 3 days.  Awaiting for TAVR evaluation.              Patient currently is not medically stable to d/c.  Consultants:  GI  Nephrology   Cardiology   Procedures:  Hemodialysis  Antibiotics:   . None  Anti-infectives (From admission, onward)   None      Subjective:  Today, patient was seen and examined at bedside.  Patient denies any chest pain, shortness of breath, fever or chills.  Denies any nausea vomiting or diarrhea.  Was ambulating in the hallway.  Objective: Vitals:   12/01/19 1030 12/01/19 1122  BP: 112/60 118/64  Pulse: 73 80  Resp:  18  Temp: 98.2 F (36.8 C) 97.8 F (36.6 C)  SpO2: 98% 100%    Intake/Output Summary (Last 24 hours) at 12/01/2019 1322 Last data filed at 12/01/2019 1230 Gross per 24 hour  Intake 660 ml  Output 600 ml  Net 60 ml   Filed Weights   12/01/19 0533 12/01/19 0714 12/01/19 1030  Weight: 74.7 kg 75.9 kg 75.1 kg   Body mass index is 32.33 kg/m.   Physical Exam: General: Alert awake and communicative, not in obvious distress, legally blind HENT:   No scleral pallor or icterus noted. Oral mucosa is moist.  Blindness chest:  Clear breath sounds.  Diminished breath sounds bilaterally. No crackles or wheezes.  CVS:   S1 heard,, soft S2, systolic murmur noted, regular rate and rhythm. Abdomen: Soft, nontender, nondistended.  Bowel sounds are heard.   Extremities: No cyanosis, clubbing or edema.  Peripheral pulses are  palpable.  Left upper extremity AV fistula  Psych: Alert, awake and oriented, normal mood CNS:  No cranial nerve deficits.  Power equal in all extremities.   Skin: Warm and dry.   Data Review: I have personally reviewed the following laboratory data and studies,  CBC: Recent Labs  Lab 11/26/19 1112 11/26/19 1112 11/26/19 1816 11/27/19 1038 11/28/19 0248 11/30/19 0434 12/01/19 0858  WBC 7.6   < > 6.2 7.0 6.3 5.6 6.2  NEUTROABS 4.0  --   --   --   --   --   --   HGB 11.7*   < > 12.1* 12.1* 11.3* 13.5 10.9*  HCT 36.8*   < > 36.9* 36.6* 35.0* 40.8 33.8*  MCV 104.8*   < > 102.5* 103.1* 101.4* 102.5* 102.4*  PLT 194   < > 209 214 208 195 228   < > = values in this interval not displayed.   Basic Metabolic Panel: Recent Labs  Lab 11/26/19 1112 11/26/19 1112  11/26/19 1816 11/27/19 1038 11/28/19 0248 11/30/19 0434 12/01/19 0512  NA 140  --   --  139 139 137 137  K 4.1  --   --  6.0* 4.1 4.0 4.1  CL 100  --   --  100 100 96* 97*  CO2 29  --   --  24 25 23 24   GLUCOSE 199*  --   --  132* 126* 119* 119*  BUN 27*  --   --  34* 41* 31* 40*  CREATININE 6.92*   < > 7.33* 7.96* 9.36* 7.64* 9.18*  CALCIUM 9.8  --   --  9.3 9.5 10.3 9.6  MG  --   --  2.4  --  2.3  --   --   PHOS  --   --   --   --   --   --  6.5*   < > = values in this interval not displayed.   Liver Function Tests: Recent Labs  Lab 11/26/19 1112 11/27/19 1038 12/01/19 0512  AST 19 41  --   ALT 15 14  --   ALKPHOS 99 72  --   BILITOT 0.5 1.3*  --   PROT 7.0 6.0*  --   ALBUMIN 3.7 3.3* 3.1*   Recent Labs  Lab 11/26/19 1112  LIPASE 29   No results for input(s): AMMONIA in the last 168 hours. Cardiac Enzymes: No results for input(s): CKTOTAL, CKMB, CKMBINDEX, TROPONINI in the last 168 hours. BNP (last 3 results) No results for input(s): BNP in the last 8760 hours.  ProBNP (last 3 results) No results for input(s): PROBNP in the last 8760 hours.  CBG: Recent Labs  Lab 11/30/19 1122 11/30/19 1630 11/30/19 2110 12/01/19 0610 12/01/19 1131  GLUCAP 110* 187* 189* 114* 141*   Recent Results (from the past 240 hour(s))  Respiratory Panel by RT PCR (Flu A&B, Covid) - Nasopharyngeal Swab     Status: None   Collection Time: 11/26/19 11:12 AM   Specimen: Nasopharyngeal Swab  Result Value Ref Range Status   SARS Coronavirus 2 by RT PCR NEGATIVE NEGATIVE Final    Comment: (NOTE) SARS-CoV-2 target nucleic acids are NOT DETECTED.  The SARS-CoV-2 RNA is generally detectable in upper respiratoy specimens during the acute phase of infection. The lowest concentration of SARS-CoV-2 viral copies this assay can detect is 131 copies/mL. A negative result does not preclude SARS-Cov-2 infection and should not be used as the sole basis for treatment  or other patient  management decisions. A negative result may occur with  improper specimen collection/handling, submission of specimen other than nasopharyngeal swab, presence of viral mutation(s) within the areas targeted by this assay, and inadequate number of viral copies (<131 copies/mL). A negative result must be combined with clinical observations, patient history, and epidemiological information. The expected result is Negative.  Fact Sheet for Patients:  PinkCheek.be  Fact Sheet for Healthcare Providers:  GravelBags.it  This test is no t yet approved or cleared by the [place] FDA and  has been authorized for detection and/or diagnosis of SARS-CoV-2 by FDA under an Emergency Use Authorization (EUA). This EUA will remain  in effect (meaning this test can be used) for the duration of the COVID-19 declaration under Section 564(b)(1) of the Act, 21 U.S.C. section 360bbb-3(b)(1), unless the authorization is terminated or revoked sooner.     Influenza A by PCR NEGATIVE NEGATIVE Final   Influenza B by PCR NEGATIVE NEGATIVE Final    Comment: (NOTE) The Xpert Xpress SARS-CoV-2/FLU/RSV assay is intended as an aid in  the diagnosis of influenza from Nasopharyngeal swab specimens and  should not be used as a sole basis for treatment. Nasal washings and  aspirates are unacceptable for Xpert Xpress SARS-CoV-2/FLU/RSV  testing.  Fact Sheet for Patients: PinkCheek.be  Fact Sheet for Healthcare Providers: GravelBags.it  This test is not yet approved or cleared by the [place] FDA and  has been authorized for detection and/or diagnosis of SARS-CoV-2 by  FDA under an Emergency Use Authorization (EUA). This EUA will remain  in effect (meaning this test can be used) for the duration of the  Covid-19 declaration under Section 564(b)(1) of the Act, 21  U.S.C. section 360bbb-3(b)(1),  unless the authorization is  terminated or revoked. Performed at [facility], [place] [address] [place], [place] [address]   MRSA PCR Screening     Status: None   Collection Time: 11/26/19  9:49 PM   Specimen: Nasal Mucosa; Nasopharyngeal  Result Value Ref Range Status   MRSA by PCR NEGATIVE NEGATIVE Final    Comment:        The GeneXpert MRSA Assay (FDA approved for NASAL specimens only), is one component of a comprehensive MRSA colonization surveillance program. It is not intended to diagnose MRSA infection nor to guide or monitor treatment for MRSA infections. Performed at [facility], [place] [address], [place], [place] [address]      Studies: US RENAL  Result Date: 11/30/2019 CLINICAL DATA:  Right-sided hydronephrosis seen incidentally on a recent prior right upper quadrant ultrasound. EXAM: RENAL / URINARY TRACT ULTRASOUND COMPLETE COMPARISON:  Right upper quadrant ultrasound 11/26/2019; prior renal ultrasound 11/10/2013 FINDINGS: Right Kidney: Renal measurements: 10.1 x 4.9 x 3.8 cm = volume: 97.6 mL. Interval resolution of hydronephrosis. Mild diffuse renal cortical thinning with lipomatous hypertrophy of the renal pelvis. The renal parenchyma is mildly echogenic. No solid lesion identified. Left Kidney: Renal measurements: 9.2 x 4.0 x 4.2 cm = volume: 77.3 mL. No evidence of hydronephrosis. Mild diffuse renal cortical thinning with lipomatous hypertrophy of the renal sinus fat. The renal parenchyma is mildly echogenic. No focal solid lesion identified. Bladder: Foley catheter visualized within the collapsed bladder. Other: None. IMPRESSION: 1. Foley catheter present within the bladder. 2. Interval resolution of hydronephrosis, presumably related to relief of bladder outlet obstruction. 3. Mildly echogenic kidneys bilaterally consistent with underlying medical renal disease. 4. Mild renal cortical thinning bilaterally. Electronically Signed   By:  Jacqulynn Cadet M.D.    On: 11/30/2019 10:57     Flora Lipps, MD  [facility] 12/01/2019

## 2019-12-01 NOTE — Anesthesia Postprocedure Evaluation (Signed)
Anesthesia Post Note  Patient: Elijah White  Procedure(s) Performed: COLONOSCOPY WITH PROPOFOL (N/A ) POLYPECTOMY HEMOSTASIS CLIP PLACEMENT SUBMUCOSAL TATTOO INJECTION     Patient location during evaluation: Endoscopy Anesthesia Type: MAC Level of consciousness: awake and patient cooperative Pain management: pain level controlled Vital Signs Assessment: post-procedure vital signs reviewed and stable Respiratory status: spontaneous breathing, nonlabored ventilation, respiratory function stable and patient connected to nasal cannula oxygen Cardiovascular status: stable and blood pressure returned to baseline Postop Assessment: no apparent nausea or vomiting Anesthetic complications: no   No complications documented.  Last Vitals:  Vitals:   12/01/19 1030 12/01/19 1122  BP: 112/60 118/64  Pulse: 73 80  Resp:  18  Temp: 36.8 C 36.6 C  SpO2: 98% 100%    Last Pain:  Vitals:   12/01/19 1126  TempSrc:   PainSc: 0-No pain                 Don Tiu

## 2019-12-01 NOTE — Progress Notes (Signed)
Pt complaining about his FOOD on the Tray as being cold and insisted to reheat. Explained that as per hospital policy we can't. Pt then get agitated and mean to the Staff. Offered food available on the floor since it was pass 8PM, Secretary handed [place] sandwich to pt and pt said "I don't Like [place], its Dry!". He does not want Graham crackers, Pudding, Apple sauce, Juices and Jello"s as well. Offered other food that were available  but Pt still refusing it, He said " I will Talk to the Head in the AM".

## 2019-12-01 NOTE — Progress Notes (Signed)
Mobility Specialist: Progress Note   12/01/19 1821  Mobility  Activity Ambulated in room  Level of Assistance Independent after set-up  Assistive Device Front wheel walker  Distance Ambulated (ft) 25 ft  Mobility Response Tolerated well  Mobility performed by Mobility specialist  Bed Position High-fowlers  $Mobility charge 1 Mobility   Pre-Mobility: 72 HR, 93%  SpO2 Post-Mobility: 64 HR, 123/58 BP, 96% SpO2  Pt c/o of feeling nauseated during ambulation that progressed worse. RN present in room. Pt did c/o of feeling a little light headed upon sitting from supine, resolved quickly. Pt had no c/o of dizziness or SOB during ambulation.   [facility] Ravonda Brecheen Mobility Specialist

## 2019-12-01 NOTE — Progress Notes (Signed)
Foley catheter discontinued as per order and condom catheter placed. Will continue to monitor urine output.

## 2019-12-02 ENCOUNTER — Inpatient Hospital Stay (HOSPITAL_COMMUNITY): Payer: Medicare Other

## 2019-12-02 DIAGNOSIS — Z01810 Encounter for preprocedural cardiovascular examination: Secondary | ICD-10-CM

## 2019-12-02 LAB — CBC
HCT: 35.7 % — ABNORMAL LOW (ref 39.0–52.0)
Hemoglobin: 11.6 g/dL — ABNORMAL LOW (ref 13.0–17.0)
MCH: 32.8 pg (ref 26.0–34.0)
MCHC: 32.5 g/dL (ref 30.0–36.0)
MCV: 100.8 fL — ABNORMAL HIGH (ref 80.0–100.0)
Platelets: 212 10*3/uL (ref 150–400)
RBC: 3.54 MIL/uL — ABNORMAL LOW (ref 4.22–5.81)
RDW: 12.6 % (ref 11.5–15.5)
WBC: 4.7 10*3/uL (ref 4.0–10.5)
nRBC: 0 % (ref 0.0–0.2)

## 2019-12-02 LAB — BASIC METABOLIC PANEL
Anion gap: 13 (ref 5–15)
BUN: 20 mg/dL (ref 8–23)
CO2: 26 mmol/L (ref 22–32)
Calcium: 9.8 mg/dL (ref 8.9–10.3)
Chloride: 96 mmol/L — ABNORMAL LOW (ref 98–111)
Creatinine, Ser: 6.44 mg/dL — ABNORMAL HIGH (ref 0.61–1.24)
GFR calc Af Amer: 9 mL/min — ABNORMAL LOW (ref >60)
GFR calc non Af Amer: 7 mL/min — ABNORMAL LOW (ref >60)
Glucose, Bld: 134 mg/dL — ABNORMAL HIGH (ref 70–99)
Potassium: 3.6 mmol/L (ref 3.5–5.1)
Sodium: 135 mmol/L (ref 135–145)

## 2019-12-02 LAB — GLUCOSE, CAPILLARY
Glucose-Capillary: 112 mg/dL — ABNORMAL HIGH (ref 70–99)
Glucose-Capillary: 128 mg/dL — ABNORMAL HIGH (ref 70–99)
Glucose-Capillary: 126 mg/dL — ABNORMAL HIGH (ref 70–99)
Glucose-Capillary: 175 mg/dL — ABNORMAL HIGH (ref 70–99)

## 2019-12-02 LAB — MAGNESIUM: Magnesium: 1.9 mg/dL (ref 1.7–2.4)

## 2019-12-02 MED ORDER — SODIUM CHLORIDE 0.9 % IV SOLN: INTRAVENOUS | Status: DC

## 2019-12-02 MED ORDER — SODIUM CHLORIDE 0.9 % IV SOLN: 250.0000 mL | INTRAVENOUS | Status: DC | PRN

## 2019-12-02 MED ORDER — SODIUM CHLORIDE 0.9% FLUSH: 3.0000 mL | INTRAVENOUS | Status: DC | PRN

## 2019-12-02 MED ORDER — CHLORHEXIDINE GLUCONATE CLOTH 2 % EX PADS: 6.0000 | MEDICATED_PAD | Freq: Every day | CUTANEOUS | Status: DC

## 2019-12-02 MED ORDER — FERRIC CITRATE 1 GM 210 MG(FE) PO TABS
420.0000 mg | ORAL_TABLET | Freq: Three times a day (TID) | ORAL | Status: DC
  Administered 2019-12-02 – 2019-12-03 (×3): 420 mg via ORAL
  Filled 2019-12-02 (×4): qty 2

## 2019-12-02 MED ORDER — SODIUM CHLORIDE 0.9% FLUSH
3.0000 mL | Freq: Two times a day (BID) | INTRAVENOUS | Status: DC
  Administered 2019-12-02: 3 mL via INTRAVENOUS

## 2019-12-02 MED ORDER — ASPIRIN EC 81 MG PO TBEC
81.0000 mg | DELAYED_RELEASE_TABLET | Freq: Once | ORAL | Status: AC
  Administered 2019-12-02: 81 mg via ORAL
  Filled 2019-12-02: qty 1

## 2019-12-02 NOTE — Progress Notes (Signed)
Physical Therapy Treatment Patient Details Name: Elijah White MRN: [id number] DOB: 1941/02/04 Today's Date: 12/02/2019    History of Present Illness Elijah White is a 79 y.o. male with medical history significant of ESRD on dialysis (TTS), hypertension, hyperlipidemia, diabetes mellitus, GERD, legally blind in left eye, chronic diastolic CHF presented to the emergency department for evaluation of chest pain/epigastric pain, shortness of breath and generalized weakness for 1 day.  Work up to r/o cardiac vs GI cause.  Colonoscopy pending.    PT Comments    Pt seen from [phone] with explanation and education regarding pre-TAVR assessment; pt taken to x-ray with therapist returning in PM to complete assessment; this note covers both sessions.  12/02/2019 PT TAVR Pre-Assessment  HPI: Elijah White is a 79 y.o. male with medical history significant of ESRD on dialysis (TTS), hypertension, hyperlipidemia, diabetes mellitus, GERD, legally blind in left eye, chronic diastolic CHF presented to the emergency department for evaluation of chest pain/epigastric pain, shortness of breath and generalized weakness for 1 day.  Work up to r/o cardiac vs GI cause.  Colonoscopy pending.  Clinical Impression Statement: Pt is a 79y.o.male being assessed for pre-TAVR.  Pt reports symptoms of fatigue with activity.  Pt has 4-/5 strength, functional ROM, and mild-moderate balance deficits.  Pt ambulated 398 ft during the 6 minute walk test requiring 0 rest breaks with max HR of 95 , lowest O2 sat 100, BP 127/64 during mobility.  5 meter walk test produced an average gait speed of 1.156 which indicates decreased velocity and high fall risk.  RPE was 3/10 and dyspnea was minimal during mobility.  Pt was limited by knee pain.  Pt's frailty rating was vulnerable which is considered not frail.  Pt would benefit from continued PT in the acute care setting due to the above listed deficits in balance, gait speed and  activity tolerance.    General UE/LE Strength and ROM:  Strength (0-5/5) ROM (limited/full)  R UE 4-/5 full  L UE 4-/5 full  R LE 4-/5 rull  L LE 4-/5 full    6 Minute Walk Test:   Total Distance Walked:398 ft.    Did the pt need a rest break? No If yes, why? Pain:No; Fatigue:Yes; Dyspnea/O2 saturations: No Comments: pt became fatigued and slightly SOB at end of the 6 min walk test; pt requiring use of RW as well as min A for guiding walker around obstacles, PT did not influence gait speed just direction   Pre-Test Post-Test  BP 122/62 127/64  HR 82 95  O2 saturations (indicated RA or L/min [place]) 100 100  Modified Borg Dyspnea Scale (0 none-10 maximal) 0 2  RPE (6 very light-10 very hard) 0 3  Comments: pt stated he was more limited by knee pain  5 Meter Walk Test:  Trial 1 15.04 seconds  Trial 2 13.22 seconds  Trial 3 14.31 seconds  3 Trial Average/Gait Speed 14.19 seconds/1.156 ft/sec (<1.8 ft/sec indicates high fall risk)  Comments: pt is a high risk for falling based on gait speed; pt requiring use of RW as well as min A for guiding walker around obstacles, PT did not influence gait speed just direction  Clinical Frailty Scale (1 very fit - 9 terminally ill):  Vulnerable (</= 5/12 is considered frail)   Lyanne Co, DPT Acute Rehabilitation Services [phone]              Follow Up Recommendations  SNF;Supervision - Intermittent;Supervision/Assistance - 24 hour;Other (comment)  Equipment Recommendations  None recommended by PT;Other (comment)    Recommendations for Other Services       Precautions / Restrictions Precautions Precautions: Fall Precaution Comments: legally blind. Restrictions Weight Bearing Restrictions: No    Mobility  Bed Mobility Overal bed mobility: Needs Assistance Bed Mobility: Supine to Sit;Sit to Supine     Supine to sit: Supervision;HOB elevated Sit to supine: Supervision   General bed mobility comments: verbal  cueing for hand placement and use of bedrails  Transfers Overall transfer level: Needs assistance Equipment used: Rolling walker (2 wheeled) Transfers: Sit to/from Stand Sit to Stand: Min guard         General transfer comment: tactile direction for low vision.  Assist to come forward and up.  Ambulation/Gait Ambulation/Gait assistance: Min guard;Min assist Gait Distance (Feet): 440 Feet Assistive device: Rolling walker (2 wheeled) Gait Pattern/deviations: Step-through pattern     General Gait Details: improved stride length noted, continues to be decreased, poor coordination and steering of RW requiring assist to correct   Stairs             Wheelchair Mobility    Modified Rankin (Stroke Patients Only)       Balance                                            Cognition Arousal/Alertness: Awake/alert Behavior During Therapy: WFL for tasks assessed/performed Overall Cognitive Status: Within Functional Limits for tasks assessed                                 General Comments: please see TAVR for vitals      Exercises      General Comments        Pertinent Vitals/Pain Pain Assessment: 0-10 Pain Score: 4  Pain Location: L knee    Home Living                      Prior Function            PT Goals (current goals can now be found in the care plan section) Acute Rehab PT Goals Patient Stated Goal: get home. PT Goal Formulation: With patient Time For Goal Achievement: 12/12/19 Potential to Achieve Goals: Good Progress towards PT goals: Progressing toward goals    Frequency    Min 3X/week      PT Plan Current plan remains appropriate    Co-evaluation              AM-PAC PT "6 Clicks" Mobility   Outcome Measure  Help needed turning from your back to your side while in a flat bed without using bedrails?: A Little Help needed moving from lying on your back to sitting on the side of a flat  bed without using bedrails?: A Little Help needed moving to and from a bed to a chair (including a wheelchair)?: A Little Help needed standing up from a chair using your arms (e.g., wheelchair or bedside chair)?: A Little Help needed to walk in hospital room?: A Little Help needed climbing 3-5 steps with a railing? : A Little 6 Click Score: 18    End of Session Equipment Utilized During Treatment: Gait belt Activity Tolerance: Patient tolerated treatment well Patient left: in bed;with call bell/phone within reach Nurse Communication: Mobility  status PT Visit Diagnosis: Unsteadiness on feet (R26.81);Muscle weakness (generalized) (M62.81);Difficulty in walking, not elsewhere classified (R26.2)     Time: 1251-1330 PT Time Calculation (min) (ACUTE ONLY): 39 min  Charges:  $Gait Training: 38-52 mins                     Lyanne Co, DPT Acute Rehabilitation Services [phone]   [place] 12/02/2019, 1:38 PM

## 2019-12-02 NOTE — Care Management Important Message (Signed)
Important Message  Patient Details  Name: Elijah White MRN: [id number] Date of Birth: [birth year]   Medicare Important Message Given:  Yes     Orbie Pyo 12/02/2019, 4:13 PM

## 2019-12-02 NOTE — Progress Notes (Signed)
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  If general condition allows, patient will need a surveillance colonoscopy in 1 year.  Please recall GI if needed.

## 2019-12-02 NOTE — Progress Notes (Signed)
Cardiology Progress Note  Patient ID: BANYAN GOODCHILD MRN: [id number] DOB: 03-31-1940 Date of Encounter: 12/02/2019  Primary Cardiologist: No primary care provider on file.  Subjective   Chief Complaint: None.  HPI: CT scan confirmed severe aortic stenosis.  Need for left and right heart cath.  Plan for today.  Plan for today.  Planned for today  ROS:  All other ROS reviewed and negative. Pertinent positives noted in the HPI.     Inpatient Medications  Scheduled Meds: . apraclonidine  1 drop Right Eye TID  . atorvastatin  10 mg Oral Daily  . carvedilol  3.125 mg Oral BID WC  . Chlorhexidine Gluconate Cloth  6 each Topical Q0600  . dorzolamide-timolol  1 drop Right Eye BID  . doxercalciferol  7 mcg Intravenous Q T,Th,Sa-HD  . ferric citrate  420 mg Oral TID WC  . gabapentin  100 mg Oral QHS  . heparin  5,000 Units Subcutaneous Q8H  . hydrALAZINE  25 mg Oral BID  . insulin aspart  0-5 Units Subcutaneous QHS  . insulin aspart  0-6 Units Subcutaneous TID WC  . isosorbide mononitrate  30 mg Oral Daily  . latanoprost  1 drop Right Eye QHS  . multivitamin  1 tablet Oral QHS  . pantoprazole  40 mg Oral Daily  . pilocarpine  1 drop Right Eye QID  . sodium chloride flush  3 mL Intravenous Q12H  . tamsulosin  0.4 mg Oral QHS   Continuous Infusions: . sodium chloride     PRN Meds: acetaminophen **OR** acetaminophen, diclofenac Sodium, hydrALAZINE, hydrocortisone cream, HYDROmorphone (DILAUDID) injection, hydroxypropyl methylcellulose / hypromellose, ondansetron **OR** ondansetron (ZOFRAN) IV   Vital Signs   Vitals:   12/01/19 1030 12/01/19 1122 12/01/19 2025 12/02/19 0334  BP: 112/60 118/64 124/63 123/60  Pulse: 73 80 64 70  Resp:  18 18 18   Temp: 98.2 F (36.8 C) 97.8 F (36.6 C) (!) 97.5 F (36.4 C) 97.8 F (36.6 C)  TempSrc: Oral Oral Oral Oral  SpO2: 98% 100% 100% 99%  Weight: 75.1 kg   78.8 kg  Height:        Intake/Output Summary (Last 24 hours) at 12/02/2019  1058 Last data filed at 12/01/2019 1230 Gross per 24 hour  Intake 240 ml  Output 150 ml  Net 90 ml   Last 3 Weights 12/02/2019 12/01/2019 12/01/2019  Weight (lbs) 173 lb 11.6 oz 165 lb 9.1 oz 167 lb 5.3 oz  Weight (kg) 78.8 kg 75.1 kg 75.9 kg      Telemetry  Overnight telemetry shows sinus rhythm in the 70s, which I personally reviewed.   ECG  The most recent ECG shows sinus rhythm, which I personally reviewed.   Physical Exam   Vitals:   12/01/19 1030 12/01/19 1122 12/01/19 2025 12/02/19 0334  BP: 112/60 118/64 124/63 123/60  Pulse: 73 80 64 70  Resp:  18 18 18   Temp: 98.2 F (36.8 C) 97.8 F (36.6 C) (!) 97.5 F (36.4 C) 97.8 F (36.6 C)  TempSrc: Oral Oral Oral Oral  SpO2: 98% 100% 100% 99%  Weight: 75.1 kg   78.8 kg  Height:         Intake/Output Summary (Last 24 hours) at 12/02/2019 1058 Last data filed at 12/01/2019 1230 Gross per 24 hour  Intake 240 ml  Output 150 ml  Net 90 ml    Last 3 Weights 12/02/2019 12/01/2019 12/01/2019  Weight (lbs) 173 lb 11.6 oz 165 lb 9.1 oz 167  lb 5.3 oz  Weight (kg) 78.8 kg 75.1 kg 75.9 kg    Body mass index is 33.93 kg/m.  General: Well nourished, well developed, in no acute distress Head: Atraumatic, normal size  Eyes: PEERLA, EOMI  Neck: Supple, no JVD Endocrine: No thryomegaly Cardiac: Normal S1, S2; 3 out of 6 systolic ejection murmur, late peaking, radiates into carotids Lungs: Clear to auscultation bilaterally, no wheezing, rhonchi or rales  Abd: Soft, nontender, no hepatomegaly  Ext: No edema, pulses 2+ Musculoskeletal: No deformities, BUE and BLE strength normal and equal Skin: Warm and dry, no rashes   Neuro: Alert and oriented to person, place, time, and situation, CNII-XII grossly intact, no focal deficits  Psych: Normal mood and affect   Labs  High Sensitivity Troponin:   Recent Labs  Lab 11/26/19 1112 11/26/19 1312 11/26/19 1816  TROPONINIHS 36* 37* 41*     Cardiac EnzymesNo results for input(s):  TROPONINI in the last 168 hours. No results for input(s): TROPIPOC in the last 168 hours.  Chemistry Recent Labs  Lab 11/26/19 1112 11/26/19 1816 11/27/19 1038 11/27/19 1038 11/28/19 0248 11/30/19 0434 12/01/19 0512  NA 140  --  139   < > 139 137 137  K 4.1  --  6.0*   < > 4.1 4.0 4.1  CL 100  --  100   < > 100 96* 97*  CO2 29  --  24   < > 25 23 24   GLUCOSE 199*  --  132*   < > 126* 119* 119*  BUN 27*  --  34*   < > 41* 31* 40*  CREATININE 6.92*   < > 7.96*   < > 9.36* 7.64* 9.18*  CALCIUM 9.8  --  9.3   < > 9.5 10.3 9.6  PROT 7.0  --  6.0*  --   --   --   --   ALBUMIN 3.7  --  3.3*  --   --   --  3.1*  AST 19  --  41  --   --   --   --   ALT 15  --  14  --   --   --   --   ALKPHOS 99  --  72  --   --   --   --   BILITOT 0.5  --  1.3*  --   --   --   --   GFRNONAA 7*   < > 6*   < > 5* 6* 5*  GFRAA 8*   < > 7*   < > 5* 7* 6*  ANIONGAP 11  --  15   < > 14 18* 16*   < > = values in this interval not displayed.    Hematology Recent Labs  Lab 11/28/19 0248 11/30/19 0434 12/01/19 0858  WBC 6.3 5.6 6.2  RBC 3.45* 3.98* 3.30*  HGB 11.3* 13.5 10.9*  HCT 35.0* 40.8 33.8*  MCV 101.4* 102.5* 102.4*  MCH 32.8 33.9 33.0  MCHC 32.3 33.1 32.2  RDW 13.0 12.7 12.6  PLT 208 195 228   BNPNo results for input(s): BNP, PROBNP in the last 168 hours.  DDimer No results for input(s): DDIMER in the last 168 hours.   Radiology  CT CORONARY MORPH W/CTA COR W/SCORE W/CA W/CM &/OR WO/CM  Result Date: 12/01/2019 CLINICAL DATA:  Severe Aortic Stenosis. EXAM: Cardiac TAVR CT TECHNIQUE: The patient was scanned on a [employer]. A 120 kV  retrospective scan was triggered in the descending thoracic aorta at 111 HU's. Gantry rotation speed was 250 msecs and collimation was .6 mm. No beta blockade or nitro were given. The 3D data set was reconstructed in 5% intervals of the R-R cycle. Systolic and diastolic phases were analyzed on a dedicated work station using MPR, MIP and VRT modes. The  patient received 80 cc of contrast. FINDINGS: Image quality: Excellent. Noise artifact is: Limited. Valve Morphology: The aortic valve is tricuspid. The leaflets are severely calcified and severely thickened. Leaflet calcification appears diffuse. The leaflets exhibit severely restricted movement in systole Aortic Valve Calcium score: 2336 Aortic annular dimension: Phase assessed: 20% Annular area: 462 mm2 Annular perimeter: 77.3 mm Max diameter: 26.1 mm Min diameter: 22.9 mm Annular and subannular calcification: None. Optimal coplanar projection: LAO 1 CRA 3 Coronary Artery Height above Annulus: Left Main: 15.9 mm Right Coronary: 16.3 mm Sinus of Valsalva Measurements: Non-coronary: 34 mm Right-coronary: 33 mm Left-coronary: 34 mm Sinus of Valsalva Height: Non-coronary: 26.0 mm Right-coronary: 23.2 mm Left-coronary: 22.5 mm Sinotubular Junction: 27 mm with mild to moderate calcifications. Ascending Thoracic Aorta: 31 mm with diffuse mixed density atherosclerosis noted. Coronary Arteries: CAC score of 802, which is 80th percentile for age- and sex-matched controls. Normal coronary origin. Right dominance. The study was performed without use of NTG and is insufficient for plaque evaluation. The following assessment was made nonetheless: Left main: The left main is a large caliber vessel with a normal take off from the left coronary cusp that trifurcates into a LAD, LCX, and ramus intermedius. There is minimal mixed density plaque (<25%). Left anterior descending artery: The proximal LAD contains mild mixed density plaque (25-49%). The mid LAD contains minimal calcified plaque (<25%). The distal LAD contains minimal non-calcified plaque (<25%). Ramus intermedius: Large vessel with minimal calcified plaque (<25%). Left circumflex artery: The LCX is non-dominant with minimal non-calcified plaque (<25%). Right coronary artery: Dominant with normal take off from the [place]. The proximal and mid RCA segments contain minimal  calcified plaque (<25%). The distal RCA contains minimal non-calcified plaque. The RCA terminates as a patent PDA and PLV branch. Cardiac Morphology: Right Atrium: Right atrial size is within normal limits. Right Ventricle: The right ventricular cavity is within normal limits. Left Atrium: Left atrial size is normal in size with no left atrial appendage filling defect. Left Ventricle: The ventricular cavity size is within normal limits. There are no stigmata of prior infarction. There is no abnormal filling defect. Normal left ventricular function, LVEF=72%. No regional wall motion abnormalities. Pulmonary arteries: Normal in size without proximal filling defect. Pulmonary veins: Normal pulmonary venous drainage. Pericardium: Normal thickness with no significant effusion or calcium present. Mitral Valve: The mitral valve is normal structure with mild mitral annular calcification. Extra-cardiac findings: See attached radiology report for non-cardiac structures. IMPRESSION: 1. 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 2. Annular measurements appropriate for 26 mm Edwards Sapien 3 TAVR 3. No significant annular or subannular calcifications. 4. Sufficient coronary to annulus distance. 5. Optimal Fluoroscopic Angle for Delivery: LAO 1 CRA 3 6. Coronary calcium score of 802 which is 80th percentile for age- and sex-matched controls. 7. 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 [place] T. Audie Box, MD Electronically Signed   By: Eleonore Chiquito   On: 12/01/2019 22:06   VAS US CAROTID  Result Date: 12/02/2019 Carotid  Arterial Duplex Study Indications:       Pre-TAVR. Risk Factors:      Hypertension, hyperlipidemia. Comparison Study:  No prior studies. Performing Technologist: Ficek Hum RVT  Examination Guidelines: A complete evaluation includes B-mode imaging, spectral  Doppler, color Doppler, and power Doppler as needed of all accessible portions of each vessel. Bilateral testing is considered an integral part of a complete examination. Limited examinations for reoccurring indications may be performed as noted.  Right Carotid Findings: +----------+--------+--------+--------+-----------------------+--------+           PSV cm/sEDV cm/sStenosisPlaque Description     Comments +----------+--------+--------+--------+-----------------------+--------+ CCA Prox  170     14              smooth and heterogenous         +----------+--------+--------+--------+-----------------------+--------+ CCA Distal97      12              smooth and heterogenous         +----------+--------+--------+--------+-----------------------+--------+ ICA Prox  37      7               smooth and heterogenous         +----------+--------+--------+--------+-----------------------+--------+ ICA Distal50      12                                     tortuous +----------+--------+--------+--------+-----------------------+--------+ ECA       77      7                                               +----------+--------+--------+--------+-----------------------+--------+ +----------+--------+-------+--------+-------------------+           PSV cm/sEDV cmsDescribeArm Pressure (mmHG) +----------+--------+-------+--------+-------------------+ [id number]                                        +----------+--------+-------+--------+-------------------+ +---------+--------+--+--------+-+---------+ VertebralPSV cm/s38EDV cm/s8Antegrade +---------+--------+--+--------+-+---------+  Left Carotid Findings: +----------+--------+--------+--------+-----------------------+--------+           PSV cm/sEDV cm/sStenosisPlaque Description     Comments +----------+--------+--------+--------+-----------------------+--------+ CCA Prox  161     22              smooth and  heterogenous         +----------+--------+--------+--------+-----------------------+--------+ CCA Distal77      15              smooth and heterogenous         +----------+--------+--------+--------+-----------------------+--------+ ICA Prox  61      10              calcific                        +----------+--------+--------+--------+-----------------------+--------+ ICA Distal41      10                                     tortuous +----------+--------+--------+--------+-----------------------+--------+ ECA       69      9                                               +----------+--------+--------+--------+-----------------------+--------+ +----------+--------+--------+--------+-------------------+  PSV cm/sEDV cm/sDescribeArm Pressure (mmHG) +----------+--------+--------+--------+-------------------+ Subclavian229                                         +----------+--------+--------+--------+-------------------+ +---------+--------+--+--------+--+---------+ VertebralPSV cm/s54EDV cm/s11Antegrade +---------+--------+--+--------+--+---------+   Summary: Right Carotid: Velocities in the right ICA are consistent with a 1-39% stenosis. Left Carotid: Velocities in the left ICA are consistent with a 1-39% stenosis. Vertebrals: Bilateral vertebral arteries demonstrate antegrade flow. *See table(s) above for measurements and observations.     Preliminary     Cardiac Studies  CTA 12/01/2019 1. 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  2. Annular measurements appropriate for 26 mm Edwards Sapien 3 TAVR  3. No significant annular or subannular calcifications.  4. Sufficient coronary to annulus distance.  5. Optimal Fluoroscopic Angle for Delivery: LAO 1 CRA 3  6. Coronary calcium score of 802 which is 80th percentile for age- and sex-matched controls.  7. Study performed without nitroglycerin but there  appears to be mild mixed density plaque in the proximal LAD (25-49%) and minimal (<25%) CAD in the LCX/RCA.  TTE 11/28/2019 1. Left ventricular ejection fraction, by estimation, is 65 to 70%. The  left ventricle has normal function. The left ventricle has no regional  wall motion abnormalities. Left ventricular diastolic parameters are  indeterminate.  2. Right ventricular systolic function is normal. The right ventricular  size is normal. Tricuspid regurgitation signal is inadequate for assessing  PA pressure.  3. The mitral valve is degenerative. Trivial mitral valve regurgitation.  No evidence of mitral stenosis. Severe mitral annular calcification.  4. The aortic valve is calcified. There is severe calcifcation of the  aortic valve. There is severe thickening of the aortic valve. Aortic valve  regurgitation is trivial. 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  5. The inferior vena cava is normal in size with greater than 50%  respiratory variability, suggesting right atrial pressure of 3 mmHg.   Patient Profile  Elijah White is a 79 y.o. male with ESRD on hemodialysis, hypertension, HFpEF who was admitted on 11/26/2019 with chest pain and shortness of breath.  Found to have severe aortic stenosis.  Assessment & Plan   1.  Symptomatic severe aortic stenosis -Admitted with chest pain or shortness of breath.  Echo with moderate to severe AS.  We performed cardiac CTA.  Aortic valve calcium score is 2300 which is consistent with severe aortic stenosis.  He does have favorable anatomy for TAVR. -He had mild nonobstructive CAD in the proximal LAD but no significant disease in the other arteries. -To complete his TAVR work-up he will need right and left heart cath.  We will plan to that today.  He is n.p.o.  He did eat breakfast. -I did spend an extensive amount of time in the room with the patient and discussing with his daughter by phone that we will need to complete his TAVR  work-up while he is here.  He can then go home and have follow-up in the structural heart disease clinic where they can further discuss the procedure.  2.  Nonobstructive CAD -He had good views of the coronary arteries on his TAVR CT.  This protocol does not include nitroglycerin however he only has mild nonobstructive disease in the proximal LAD.  Cardiac cath today as part of TAVR work-up. -Continue aspirin and statin.  No symptoms of angina.  For questions  or updates, please contact Luke Please consult [email or web address] for contact info under   Time Spent with Patient: I have spent a total of 25 minutes with patient reviewing hospital notes, telemetry, EKGs, labs and examining the patient as well as establishing an assessment and plan that was discussed with the patient.  > 50% of time was spent in direct patient care.    Signed, Addison Naegeli. Audie Box, [place]  12/02/2019 10:58 AM

## 2019-12-02 NOTE — H&P (View-Only) (Signed)
Cardiology Progress Note  Patient ID: Elijah White MRN: [id number] DOB: 01-16-1941 Date of Encounter: 12/02/2019  Primary Cardiologist: No primary care provider on file.  Subjective   Chief Complaint: None.  HPI: CT scan confirmed severe aortic stenosis.  Need for left and right heart cath.  Plan for today.  Plan for today.  Planned for today  ROS:  All other ROS reviewed and negative. Pertinent positives noted in the HPI.     Inpatient Medications  Scheduled Meds: . apraclonidine  1 drop Right Eye TID  . atorvastatin  10 mg Oral Daily  . carvedilol  3.125 mg Oral BID WC  . Chlorhexidine Gluconate Cloth  6 each Topical Q0600  . dorzolamide-timolol  1 drop Right Eye BID  . doxercalciferol  7 mcg Intravenous Q T,Th,Sa-HD  . ferric citrate  420 mg Oral TID WC  . gabapentin  100 mg Oral QHS  . heparin  5,000 Units Subcutaneous Q8H  . hydrALAZINE  25 mg Oral BID  . insulin aspart  0-5 Units Subcutaneous QHS  . insulin aspart  0-6 Units Subcutaneous TID WC  . isosorbide mononitrate  30 mg Oral Daily  . latanoprost  1 drop Right Eye QHS  . multivitamin  1 tablet Oral QHS  . pantoprazole  40 mg Oral Daily  . pilocarpine  1 drop Right Eye QID  . sodium chloride flush  3 mL Intravenous Q12H  . tamsulosin  0.4 mg Oral QHS   Continuous Infusions: . sodium chloride     PRN Meds: acetaminophen **OR** acetaminophen, diclofenac Sodium, hydrALAZINE, hydrocortisone cream, HYDROmorphone (DILAUDID) injection, hydroxypropyl methylcellulose / hypromellose, ondansetron **OR** ondansetron (ZOFRAN) IV   Vital Signs   Vitals:   12/01/19 1030 12/01/19 1122 12/01/19 2025 12/02/19 0334  BP: 112/60 118/64 124/63 123/60  Pulse: 73 80 64 70  Resp:  18 18 18   Temp: 98.2 F (36.8 C) 97.8 F (36.6 C) (!) 97.5 F (36.4 C) 97.8 F (36.6 C)  TempSrc: Oral Oral Oral Oral  SpO2: 98% 100% 100% 99%  Weight: 75.1 kg   78.8 kg  Height:        Intake/Output Summary (Last 24 hours) at 12/02/2019  1058 Last data filed at 12/01/2019 1230 Gross per 24 hour  Intake 240 ml  Output 150 ml  Net 90 ml   Last 3 Weights 12/02/2019 12/01/2019 12/01/2019  Weight (lbs) 173 lb 11.6 oz 165 lb 9.1 oz 167 lb 5.3 oz  Weight (kg) 78.8 kg 75.1 kg 75.9 kg      Telemetry  Overnight telemetry shows sinus rhythm in the 70s, which I personally reviewed.   ECG  The most recent ECG shows sinus rhythm, which I personally reviewed.   Physical Exam   Vitals:   12/01/19 1030 12/01/19 1122 12/01/19 2025 12/02/19 0334  BP: 112/60 118/64 124/63 123/60  Pulse: 73 80 64 70  Resp:  18 18 18   Temp: 98.2 F (36.8 C) 97.8 F (36.6 C) (!) 97.5 F (36.4 C) 97.8 F (36.6 C)  TempSrc: Oral Oral Oral Oral  SpO2: 98% 100% 100% 99%  Weight: 75.1 kg   78.8 kg  Height:         Intake/Output Summary (Last 24 hours) at 12/02/2019 1058 Last data filed at 12/01/2019 1230 Gross per 24 hour  Intake 240 ml  Output 150 ml  Net 90 ml    Last 3 Weights 12/02/2019 12/01/2019 12/01/2019  Weight (lbs) 173 lb 11.6 oz 165 lb 9.1 oz 167  lb 5.3 oz  Weight (kg) 78.8 kg 75.1 kg 75.9 kg    Body mass index is 33.93 kg/m.  General: Well nourished, well developed, in no acute distress Head: Atraumatic, normal size  Eyes: PEERLA, EOMI  Neck: Supple, no JVD Endocrine: No thryomegaly Cardiac: Normal S1, S2; 3 out of 6 systolic ejection murmur, late peaking, radiates into carotids Lungs: Clear to auscultation bilaterally, no wheezing, rhonchi or rales  Abd: Soft, nontender, no hepatomegaly  Ext: No edema, pulses 2+ Musculoskeletal: No deformities, BUE and BLE strength normal and equal Skin: Warm and dry, no rashes   Neuro: Alert and oriented to person, place, time, and situation, CNII-XII grossly intact, no focal deficits  Psych: Normal mood and affect   Labs  High Sensitivity Troponin:   Recent Labs  Lab 11/26/19 1112 11/26/19 1312 11/26/19 1816  TROPONINIHS 36* 37* 41*     Cardiac EnzymesNo results for input(s):  TROPONINI in the last 168 hours. No results for input(s): TROPIPOC in the last 168 hours.  Chemistry Recent Labs  Lab 11/26/19 1112 11/26/19 1816 11/27/19 1038 11/27/19 1038 11/28/19 0248 11/30/19 0434 12/01/19 0512  NA 140  --  139   < > 139 137 137  K 4.1  --  6.0*   < > 4.1 4.0 4.1  CL 100  --  100   < > 100 96* 97*  CO2 29  --  24   < > 25 23 24   GLUCOSE 199*  --  132*   < > 126* 119* 119*  BUN 27*  --  34*   < > 41* 31* 40*  CREATININE 6.92*   < > 7.96*   < > 9.36* 7.64* 9.18*  CALCIUM 9.8  --  9.3   < > 9.5 10.3 9.6  PROT 7.0  --  6.0*  --   --   --   --   ALBUMIN 3.7  --  3.3*  --   --   --  3.1*  AST 19  --  41  --   --   --   --   ALT 15  --  14  --   --   --   --   ALKPHOS 99  --  72  --   --   --   --   BILITOT 0.5  --  1.3*  --   --   --   --   GFRNONAA 7*   < > 6*   < > 5* 6* 5*  GFRAA 8*   < > 7*   < > 5* 7* 6*  ANIONGAP 11  --  15   < > 14 18* 16*   < > = values in this interval not displayed.    Hematology Recent Labs  Lab 11/28/19 0248 11/30/19 0434 12/01/19 0858  WBC 6.3 5.6 6.2  RBC 3.45* 3.98* 3.30*  HGB 11.3* 13.5 10.9*  HCT 35.0* 40.8 33.8*  MCV 101.4* 102.5* 102.4*  MCH 32.8 33.9 33.0  MCHC 32.3 33.1 32.2  RDW 13.0 12.7 12.6  PLT 208 195 228   BNPNo results for input(s): BNP, PROBNP in the last 168 hours.  DDimer No results for input(s): DDIMER in the last 168 hours.   Radiology  CT CORONARY MORPH W/CTA COR W/SCORE W/CA W/CM &/OR WO/CM  Result Date: 12/01/2019 CLINICAL DATA:  Severe Aortic Stenosis. EXAM: Cardiac TAVR CT TECHNIQUE: The patient was scanned on a [employer]. A 120 kV  retrospective scan was triggered in the descending thoracic aorta at 111 HU's. Gantry rotation speed was 250 msecs and collimation was .6 mm. No beta blockade or nitro were given. The 3D data set was reconstructed in 5% intervals of the R-R cycle. Systolic and diastolic phases were analyzed on a dedicated work station using MPR, MIP and VRT modes. The  patient received 80 cc of contrast. FINDINGS: Image quality: Excellent. Noise artifact is: Limited. Valve Morphology: The aortic valve is tricuspid. The leaflets are severely calcified and severely thickened. Leaflet calcification appears diffuse. The leaflets exhibit severely restricted movement in systole Aortic Valve Calcium score: 2336 Aortic annular dimension: Phase assessed: 20% Annular area: 462 mm2 Annular perimeter: 77.3 mm Max diameter: 26.1 mm Min diameter: 22.9 mm Annular and subannular calcification: None. Optimal coplanar projection: LAO 1 CRA 3 Coronary Artery Height above Annulus: Left Main: 15.9 mm Right Coronary: 16.3 mm Sinus of Valsalva Measurements: Non-coronary: 34 mm Right-coronary: 33 mm Left-coronary: 34 mm Sinus of Valsalva Height: Non-coronary: 26.0 mm Right-coronary: 23.2 mm Left-coronary: 22.5 mm Sinotubular Junction: 27 mm with mild to moderate calcifications. Ascending Thoracic Aorta: 31 mm with diffuse mixed density atherosclerosis noted. Coronary Arteries: CAC score of 802, which is 80th percentile for age- and sex-matched controls. Normal coronary origin. Right dominance. The study was performed without use of NTG and is insufficient for plaque evaluation. The following assessment was made nonetheless: Left main: The left main is a large caliber vessel with a normal take off from the left coronary cusp that trifurcates into a LAD, LCX, and ramus intermedius. There is minimal mixed density plaque (<25%). Left anterior descending artery: The proximal LAD contains mild mixed density plaque (25-49%). The mid LAD contains minimal calcified plaque (<25%). The distal LAD contains minimal non-calcified plaque (<25%). Ramus intermedius: Large vessel with minimal calcified plaque (<25%). Left circumflex artery: The LCX is non-dominant with minimal non-calcified plaque (<25%). Right coronary artery: Dominant with normal take off from the [place]. The proximal and mid RCA segments contain minimal  calcified plaque (<25%). The distal RCA contains minimal non-calcified plaque. The RCA terminates as a patent PDA and PLV branch. Cardiac Morphology: Right Atrium: Right atrial size is within normal limits. Right Ventricle: The right ventricular cavity is within normal limits. Left Atrium: Left atrial size is normal in size with no left atrial appendage filling defect. Left Ventricle: The ventricular cavity size is within normal limits. There are no stigmata of prior infarction. There is no abnormal filling defect. Normal left ventricular function, LVEF=72%. No regional wall motion abnormalities. Pulmonary arteries: Normal in size without proximal filling defect. Pulmonary veins: Normal pulmonary venous drainage. Pericardium: Normal thickness with no significant effusion or calcium present. Mitral Valve: The mitral valve is normal structure with mild mitral annular calcification. Extra-cardiac findings: See attached radiology report for non-cardiac structures. IMPRESSION: 1. 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 2. Annular measurements appropriate for 26 mm Edwards Sapien 3 TAVR 3. No significant annular or subannular calcifications. 4. Sufficient coronary to annulus distance. 5. Optimal Fluoroscopic Angle for Delivery: LAO 1 CRA 3 6. Coronary calcium score of 802 which is 80th percentile for age- and sex-matched controls. 7. 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 [place] T. Audie Box, MD Electronically Signed   By: Eleonore Chiquito   On: 12/01/2019 22:06   VAS US CAROTID  Result Date: 12/02/2019 Carotid  Arterial Duplex Study Indications:       Pre-TAVR. Risk Factors:      Hypertension, hyperlipidemia. Comparison Study:  No prior studies. Performing Technologist: Bains Hum RVT  Examination Guidelines: A complete evaluation includes B-mode imaging, spectral  Doppler, color Doppler, and power Doppler as needed of all accessible portions of each vessel. Bilateral testing is considered an integral part of a complete examination. Limited examinations for reoccurring indications may be performed as noted.  Right Carotid Findings: +----------+--------+--------+--------+-----------------------+--------+           PSV cm/sEDV cm/sStenosisPlaque Description     Comments +----------+--------+--------+--------+-----------------------+--------+ CCA Prox  170     14              smooth and heterogenous         +----------+--------+--------+--------+-----------------------+--------+ CCA Distal97      12              smooth and heterogenous         +----------+--------+--------+--------+-----------------------+--------+ ICA Prox  37      7               smooth and heterogenous         +----------+--------+--------+--------+-----------------------+--------+ ICA Distal50      12                                     tortuous +----------+--------+--------+--------+-----------------------+--------+ ECA       77      7                                               +----------+--------+--------+--------+-----------------------+--------+ +----------+--------+-------+--------+-------------------+           PSV cm/sEDV cmsDescribeArm Pressure (mmHG) +----------+--------+-------+--------+-------------------+ [id number]                                        +----------+--------+-------+--------+-------------------+ +---------+--------+--+--------+-+---------+ VertebralPSV cm/s38EDV cm/s8Antegrade +---------+--------+--+--------+-+---------+  Left Carotid Findings: +----------+--------+--------+--------+-----------------------+--------+           PSV cm/sEDV cm/sStenosisPlaque Description     Comments +----------+--------+--------+--------+-----------------------+--------+ CCA Prox  161     22              smooth and  heterogenous         +----------+--------+--------+--------+-----------------------+--------+ CCA Distal77      15              smooth and heterogenous         +----------+--------+--------+--------+-----------------------+--------+ ICA Prox  61      10              calcific                        +----------+--------+--------+--------+-----------------------+--------+ ICA Distal41      10                                     tortuous +----------+--------+--------+--------+-----------------------+--------+ ECA       69      9                                               +----------+--------+--------+--------+-----------------------+--------+ +----------+--------+--------+--------+-------------------+  PSV cm/sEDV cm/sDescribeArm Pressure (mmHG) +----------+--------+--------+--------+-------------------+ Subclavian229                                         +----------+--------+--------+--------+-------------------+ +---------+--------+--+--------+--+---------+ VertebralPSV cm/s54EDV cm/s11Antegrade +---------+--------+--+--------+--+---------+   Summary: Right Carotid: Velocities in the right ICA are consistent with a 1-39% stenosis. Left Carotid: Velocities in the left ICA are consistent with a 1-39% stenosis. Vertebrals: Bilateral vertebral arteries demonstrate antegrade flow. *See table(s) above for measurements and observations.     Preliminary     Cardiac Studies  CTA 12/01/2019 1. 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  2. Annular measurements appropriate for 26 mm Edwards Sapien 3 TAVR  3. No significant annular or subannular calcifications.  4. Sufficient coronary to annulus distance.  5. Optimal Fluoroscopic Angle for Delivery: LAO 1 CRA 3  6. Coronary calcium score of 802 which is 80th percentile for age- and sex-matched controls.  7. Study performed without nitroglycerin but there  appears to be mild mixed density plaque in the proximal LAD (25-49%) and minimal (<25%) CAD in the LCX/RCA.  TTE 11/28/2019 1. Left ventricular ejection fraction, by estimation, is 65 to 70%. The  left ventricle has normal function. The left ventricle has no regional  wall motion abnormalities. Left ventricular diastolic parameters are  indeterminate.  2. Right ventricular systolic function is normal. The right ventricular  size is normal. Tricuspid regurgitation signal is inadequate for assessing  PA pressure.  3. The mitral valve is degenerative. Trivial mitral valve regurgitation.  No evidence of mitral stenosis. Severe mitral annular calcification.  4. The aortic valve is calcified. There is severe calcifcation of the  aortic valve. There is severe thickening of the aortic valve. Aortic valve  regurgitation is trivial. 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  5. The inferior vena cava is normal in size with greater than 50%  respiratory variability, suggesting right atrial pressure of 3 mmHg.   Patient Profile  Elijah White is a 79 y.o. male with ESRD on hemodialysis, hypertension, HFpEF who was admitted on 11/26/2019 with chest pain and shortness of breath.  Found to have severe aortic stenosis.  Assessment & Plan   1.  Symptomatic severe aortic stenosis -Admitted with chest pain or shortness of breath.  Echo with moderate to severe AS.  We performed cardiac CTA.  Aortic valve calcium score is 2300 which is consistent with severe aortic stenosis.  He does have favorable anatomy for TAVR. -He had mild nonobstructive CAD in the proximal LAD but no significant disease in the other arteries. -To complete his TAVR work-up he will need right and left heart cath.  We will plan to that today.  He is n.p.o.  He did eat breakfast. -I did spend an extensive amount of time in the room with the patient and discussing with his daughter by phone that we will need to complete his TAVR  work-up while he is here.  He can then go home and have follow-up in the structural heart disease clinic where they can further discuss the procedure.  2.  Nonobstructive CAD -He had good views of the coronary arteries on his TAVR CT.  This protocol does not include nitroglycerin however he only has mild nonobstructive disease in the proximal LAD.  Cardiac cath today as part of TAVR work-up. -Continue aspirin and statin.  No symptoms of angina.  For questions  or updates, please contact [place] Please consult [email or web address] for contact info under   Time Spent with Patient: I have spent a total of 25 minutes with patient reviewing hospital notes, telemetry, EKGs, labs and examining the patient as well as establishing an assessment and plan that was discussed with the patient.  > 50% of time was spent in direct patient care.    Signed, Addison Naegeli. Audie Box, [place]  12/02/2019 10:58 AM

## 2019-12-02 NOTE — Progress Notes (Signed)
Carotid artery duplex has been completed. Preliminary results can be found in CV Proc through chart review.   12/02/19 9:42 AM Elijah White RVT

## 2019-12-02 NOTE — Progress Notes (Signed)
[facility] Progress Note   Dialysis Orders: TTS [place] 4 hr EDW 76 2 K 2 Ca 400/800 left upper AVF  no heparin no Fe no ESA hectorol 7 Recent labs: hgb 12 iPTH 300s Ca/P controlled - 2 [place] ac; very compliant with treatments   Assessment/Plan: 1. 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  Seen by cardiology, min ^troponin, no w/u needed.  2. Abd pain - PPI started, ^total bil noted, US showed GB distension with stone wedged at GB neck no acute cholecystitis. Per pmd.  3. Colon mass - s/p colonoscopy per GI.  Multiple polyps were removed and path sent 4. 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 Pt started HD Feb 2021, but no recent imaging in Epic.  Replaced foley and renal US repeat with resolution of hydronephrosis/improvement in bladder outlet obstruction. Started flomax 9/27.  Foley catheter was removed on 9/28 per primary  5. ESRD -  TTS HD. Renal panel ordered.  6. Hypertension/volume  - controlled; new EDW at d/c 7. Anemia  -no ESA/ IV Fe ; note colon mass 8. 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  Continue auryxia - increased to home dose for hyperphos 10. 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   Disposition per primary team     Subjective:    Last HD on 9/28 with 0.2 kg UF.  Pt states that his catheter was removed yesterday afternoon.  Had 150 ml uop over 9/28 charted.  States he is to have a heart test today.   Review of systems:    Denies shortness of breath or chest pain No n/v   Objective Vitals:   12/01/19 1030 12/01/19 1122 12/01/19 2025 12/02/19 0334  BP: 112/60 118/64 124/63 123/60  Pulse: 73 80 64 70  Resp:  18 18 18   Temp: 98.2 F (36.8  C) 97.8 F (36.6 C) (!) 97.5 F (36.4 C) 97.8 F (36.6 C)  TempSrc: Oral Oral Oral Oral  SpO2: 98% 100% 100% 99%  Weight: 75.1 kg   78.8 kg  Height:       Physical Exam   General: adult male in bed in NAD   Heart: S1S2 no rub Lungs: clear and unlabored on room air  Abdomen: soft NT Extremities: no LE edema  Neuro - alert and oriented x3 provides hx and follows commands; blind  Psych normal mood and affect Dialysis Access: left upper AVF +bruit and thrill   Additional Objective Labs: Basic Metabolic Panel: Recent Labs  Lab 11/28/19 0248 11/30/19 0434 12/01/19 0512  NA 139 137 137  K 4.1 4.0 4.1  CL 100 96* 97*  CO2 25 23 24   GLUCOSE 126* 119* 119*  BUN 41* 31* 40*  CREATININE 9.36* 7.64* 9.18*  CALCIUM 9.5 10.3 9.6  PHOS  --   --  6.5*   Liver Function Tests: Recent Labs  Lab 11/26/19 1112 11/27/19 1038 12/01/19 0512  AST 19 41  --   ALT 15 14  --   ALKPHOS 99 72  --   BILITOT 0.5 1.3*  --   PROT 7.0 6.0*  --   ALBUMIN 3.7 3.3* 3.1*   Recent Labs  Lab 11/26/19 1112  LIPASE 29   CBC: Recent Labs  Lab 11/26/19 1112 11/26/19 1112 11/26/19 1816 11/26/19 1816 11/27/19 1038 11/27/19 1038 11/28/19 0248 11/30/19 0434 12/01/19 0858  WBC 7.6   < > 6.2   < > 7.0   < > 6.3 5.6 6.2  NEUTROABS 4.0  --   --   --   --   --   --   --   --   HGB 11.7*   < > 12.1*   < > 12.1*   < > 11.3* 13.5 10.9*  HCT 36.8*   < > 36.9*   < > 36.6*   < > 35.0* 40.8 33.8*  MCV 104.8*   < > 102.5*  --  103.1*  --  101.4* 102.5* 102.4*  PLT 194   < > 209   < > 214   < > 208 195 228   < > = values in this interval not displayed.   Blood Culture    Component Value Date/Time   SDES URINE, RANDOM 06/05/2007 0039   SPECREQUEST NONE 06/05/2007 0039   CULT NO GROWTH 06/05/2007 0039   REPTSTATUS 06/06/2007 FINAL 06/05/2007 0039    Cardiac Enzymes: No results for input(s): CKTOTAL, CKMB, CKMBINDEX, TROPONINI in the last 168 hours. CBG: Recent Labs  Lab 12/01/19 0610  12/01/19 1131 12/01/19 1638 12/01/19 2121 12/02/19 0621  GLUCAP 114* 141* 163* 148* 112*   Iron Studies: No results for input(s): IRON, TIBC, TRANSFERRIN, FERRITIN in the last 72 hours. Lab Results  Component Value Date   INR 1.0 11/26/2019   INR 1.11 03/25/2018   Studies/Results: US RENAL  Result Date: 11/30/2019 CLINICAL DATA:  Right-sided hydronephrosis seen incidentally on a recent prior right upper quadrant ultrasound. EXAM: RENAL / URINARY TRACT ULTRASOUND COMPLETE COMPARISON:  Right upper quadrant ultrasound 11/26/2019; prior renal ultrasound 11/10/2013 FINDINGS: Right Kidney: Renal measurements: 10.1 x 4.9 x 3.8 cm = volume: 97.6 mL. Interval resolution of hydronephrosis. Mild diffuse renal cortical thinning with lipomatous hypertrophy of the renal pelvis. The renal parenchyma is mildly echogenic. No solid lesion identified. Left Kidney: Renal measurements: 9.2 x 4.0 x 4.2 cm = volume: 77.3 mL. No evidence of hydronephrosis. Mild diffuse renal cortical thinning with lipomatous hypertrophy of the renal sinus fat. The renal parenchyma is mildly echogenic. No focal solid lesion identified. Bladder: Foley catheter visualized within the collapsed bladder. Other: None. IMPRESSION: 1. Foley catheter present within the bladder. 2. Interval resolution of hydronephrosis, presumably related to relief of bladder outlet obstruction. 3. Mildly echogenic kidneys bilaterally consistent with underlying medical renal disease. 4. Mild renal cortical thinning bilaterally. Electronically Signed   By: Jacqulynn Cadet M.D.   On: 11/30/2019 10:57   CT CORONARY MORPH W/CTA COR W/SCORE W/CA W/CM &/OR WO/CM  Result Date: 12/01/2019 CLINICAL DATA:  Severe Aortic Stenosis. EXAM: Cardiac TAVR CT TECHNIQUE: The patient was scanned on a [employer]. A 120 kV retrospective scan was triggered in the descending thoracic aorta at 111 HU's. Gantry rotation speed was 250 msecs and collimation was .6 mm. No beta  blockade or nitro were given. The 3D data set was reconstructed in 5% intervals of the R-R cycle. Systolic and diastolic phases were analyzed on a dedicated work station using MPR, MIP and VRT modes. The patient received 80 cc of contrast. FINDINGS: Image quality: Excellent. Noise artifact is: Limited. Valve Morphology: The aortic valve is tricuspid. The leaflets are severely calcified and severely thickened. Leaflet calcification appears diffuse. The leaflets exhibit severely restricted  movement in systole Aortic Valve Calcium score: 2336 Aortic annular dimension: Phase assessed: 20% Annular area: 462 mm2 Annular perimeter: 77.3 mm Max diameter: 26.1 mm Min diameter: 22.9 mm Annular and subannular calcification: None. Optimal coplanar projection: LAO 1 CRA 3 Coronary Artery Height above Annulus: Left Main: 15.9 mm Right Coronary: 16.3 mm Sinus of Valsalva Measurements: Non-coronary: 34 mm Right-coronary: 33 mm Left-coronary: 34 mm Sinus of Valsalva Height: Non-coronary: 26.0 mm Right-coronary: 23.2 mm Left-coronary: 22.5 mm Sinotubular Junction: 27 mm with mild to moderate calcifications. Ascending Thoracic Aorta: 31 mm with diffuse mixed density atherosclerosis noted. Coronary Arteries: CAC score of 802, which is 80th percentile for age- and sex-matched controls. Normal coronary origin. Right dominance. The study was performed without use of NTG and is insufficient for plaque evaluation. The following assessment was made nonetheless: Left main: The left main is a large caliber vessel with a normal take off from the left coronary cusp that trifurcates into a LAD, LCX, and ramus intermedius. There is minimal mixed density plaque (<25%). Left anterior descending artery: The proximal LAD contains mild mixed density plaque (25-49%). The mid LAD contains minimal calcified plaque (<25%). The distal LAD contains minimal non-calcified plaque (<25%). Ramus intermedius: Large vessel with minimal calcified plaque (<25%). Left  circumflex artery: The LCX is non-dominant with minimal non-calcified plaque (<25%). Right coronary artery: Dominant with normal take off from the [place]. The proximal and mid RCA segments contain minimal calcified plaque (<25%). The distal RCA contains minimal non-calcified plaque. The RCA terminates as a patent PDA and PLV branch. Cardiac Morphology: Right Atrium: Right atrial size is within normal limits. Right Ventricle: The right ventricular cavity is within normal limits. Left Atrium: Left atrial size is normal in size with no left atrial appendage filling defect. Left Ventricle: The ventricular cavity size is within normal limits. There are no stigmata of prior infarction. There is no abnormal filling defect. Normal left ventricular function, LVEF=72%. No regional wall motion abnormalities. Pulmonary arteries: Normal in size without proximal filling defect. Pulmonary veins: Normal pulmonary venous drainage. Pericardium: Normal thickness with no significant effusion or calcium present. Mitral Valve: The mitral valve is normal structure with mild mitral annular calcification. Extra-cardiac findings: See attached radiology report for non-cardiac structures. IMPRESSION: 1. 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 2. Annular measurements appropriate for 26 mm Edwards Sapien 3 TAVR 3. No significant annular or subannular calcifications. 4. Sufficient coronary to annulus distance. 5. Optimal Fluoroscopic Angle for Delivery: LAO 1 CRA 3 6. Coronary calcium score of 802 which is 80th percentile for age- and sex-matched controls. 7. 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 [place] T. Audie Box, MD Electronically Signed   By: Eleonore Chiquito   On: 12/01/2019 22:06   Medications:  . apraclonidine  1 drop Right Eye TID  . atorvastatin  10 mg Oral Daily  . carvedilol  3.125  mg Oral BID WC  . Chlorhexidine Gluconate Cloth  6 each Topical Q0600  . Chlorhexidine Gluconate Cloth  6 each Topical Q0600  . dorzolamide-timolol  1 drop Right Eye BID  . doxercalciferol  7 mcg Intravenous Q T,Th,Sa-HD  . ferric citrate  210 mg Oral TID WC  . gabapentin  100 mg Oral QHS  . heparin  5,000 Units Subcutaneous Q8H  . hydrALAZINE  25 mg Oral BID  . insulin aspart  0-5 Units Subcutaneous  QHS  . insulin aspart  0-6 Units Subcutaneous TID WC  . isosorbide mononitrate  30 mg Oral Daily  . latanoprost  1 drop Right Eye QHS  . multivitamin  1 tablet Oral QHS  . pantoprazole  40 mg Oral Daily  . pilocarpine  1 drop Right Eye QID  . tamsulosin  0.4 mg Oral QHS    Claudia Desanctis, MD 12/02/2019  6:40 AM

## 2019-12-02 NOTE — Progress Notes (Signed)
PROGRESS NOTE    Elijah White   WPV:[id number]  DOB: Apr 20, 1940  DOA: 11/26/2019 PCP: [name], MD   Brief Narrative:  [name] a 78 y.o.malewith medical history significant ofESRD on dialysis (TTS), hypertension, hyperlipidemia, diabetes mellitus, GERD, legally blind in left eye, chronic diastolic CHF presented to the emergency department for evaluation of chest pain/epigastric pain, shortness of breath and generalized weakness for 1 day.  Patient's daughter was driving him to dialysis unit when he developed chest pain so he was brought into the hospital he also had shortness of breath.  Patient had also lost 10 to 12 pounds in last 12-month since he has been started on dialysis. Patient daughter stated that he lives alone at home and he is independent on daily life activities. She is concerned as patient is legally blind from left eye and head recent fall with bruises on his arm and is concerned about safety at home. Upon arrival to ED: Patient's vital signs stable, afebrile, no leukocytosis, maintaining oxygen saturation on room air, initial troponin 36 trended up to 37, EKG: No acute changes, COVID-19 negative. CT head negative for acute findings, lipase: WNL. CTA chest: Negative for PE or aortic dissection.CT was concerning for 2.3 cm in size transverse colon mass, 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. Chest x-ray showed pulmonary vascular congestion without pulmonary edema, mild cardiomegaly. EDP consulted nephrology.  Patient was then admitted to hospital for further work-up and treatment.   Subjective: No complaints     Assessment & Plan:   Principal Problem:   Chest pain dyspnea on exertion secondary to severe aortic stenosis -Has nonobstructive coronary artery disease per cardiology-continue aspirin and statin -Calcium score is elevated on the cardiac CTA which is consistent with severe aortic stenosis -The patient is going for left and right heart cath  today -He will subsequently follow-up as outpatient for possible TAVR  Active Problems: Colon mass -2.3 cm mass seen on CT scan -Underwent colonoscopy on 11/30/2019 and 3 polyps were found and removed -Biopsy pending    Type 2 diabetes mellitus with renal manifestations  -Continue insulin sliding scale hemoglobin A1c is 7.5  Bilateral hydronephrosis -Improved after Foley catheter placed-Flomax initiated and Foley catheter removed    Essential hypertension neck diastolic heart failure grade 1 -Continue hydralazine and Imdur     ESRD (end stage renal disease)  -To new dialysis per nephrology    Macrocytic anemia   Adrenal mass  -2.7 cm in size-patient does not wish to proceed with a work-up  Coley lithiasis -No evidence of cholecystitis  Legally blind   Time spent in minutes: 35 minutes DVT prophylaxis: heparin injection 5,000 Units Start: 11/26/19 1600 SCDs Start: 11/26/19 1554  Code Status: DO NOT RESUSCITATE Family Communication:  Disposition Plan:  Status is: Inpatient  Remains inpatient appropriate because:Ongoing cardiac work-up   Dispo: The patient is from: Home              Anticipated d/c is to: Home-he is declining SNF              Anticipated d/c date is: 1 day              Patient currently is medically stable to d/c.      Consultants:   GI  Cardiology Procedures:   2D echo TTE 11/28/2019 1. Left ventricular ejection fraction, by estimation, is 65 to 70%. The  left ventricle has normal function. The left ventricle has no regional  wall motion abnormalities. Left  ventricular diastolic parameters are  indeterminate.  2. Right ventricular systolic function is normal. The right ventricular  size is normal. Tricuspid regurgitation signal is inadequate for assessing  PA pressure.  3. The mitral valve is degenerative. Trivial mitral valve regurgitation.  No evidence of mitral stenosis. Severe mitral annular calcification.  4. The aortic  valve is calcified. There is severe calcifcation of the  aortic valve. There is severe thickening of the aortic valve. Aortic valve  regurgitation is trivial. 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  5. The inferior vena cava is normal in size with greater than 50%  respiratory variability, suggesting right atrial pressure of 3 mmHg.  Antimicrobials:  Anti-infectives (From admission, onward)   None       Objective: Vitals:   12/01/19 1122 12/01/19 2025 12/02/19 0334 12/02/19 1651  BP: 118/64 124/63 123/60 116/64  Pulse: 80 64 70 79  Resp: 18 18 18 20   Temp: 97.8 F (36.6 C) (!) 97.5 F (36.4 C) 97.8 F (36.6 C) 98.2 F (36.8 C)  TempSrc: Oral Oral Oral Oral  SpO2: 100% 100% 99% 99%  Weight:   78.8 kg   Height:       No intake or output data in the 24 hours ending 12/02/19 1743 Filed Weights   12/01/19 0714 12/01/19 1030 12/02/19 0334  Weight: 75.9 kg 75.1 kg 78.8 kg    Examination: General exam: Appears comfortable  HEENT: PERRLA, oral mucosa moist, no sclera icterus or thrush Respiratory system: Clear to auscultation. Respiratory effort normal. Cardiovascular system: S1 & S2 heard, RRR.  2/6 murmur at right upper sternal border Gastrointestinal system: Abdomen soft, non-tender, nondistended. Normal bowel sounds. Central nervous system: Alert and oriented. No focal neurological deficits. Extremities: No cyanosis, clubbing or edema Skin: No rashes or ulcers Psychiatry:  Mood & affect appropriate.     Data Reviewed: I have personally reviewed following labs and imaging studies  CBC: Recent Labs  Lab 11/26/19 1112 11/26/19 1816 11/27/19 1038 11/28/19 0248 11/30/19 0434 12/01/19 0858 12/02/19 1215  WBC 7.6   < > 7.0 6.3 5.6 6.2 4.7  NEUTROABS 4.0  --   --   --   --   --   --   HGB 11.7*   < > 12.1* 11.3* 13.5 10.9* 11.6*  HCT 36.8*   < > 36.6* 35.0* 40.8 33.8* 35.7*  MCV 104.8*   < > 103.1* 101.4* 102.5* 102.4* 100.8*  PLT 194   < > 214 208 195 228 212    < > = values in this interval not displayed.   Basic Metabolic Panel: Recent Labs  Lab 11/26/19 1112 11/26/19 1816 11/27/19 1038 11/28/19 0248 11/30/19 0434 12/01/19 0512 12/02/19 1215  NA   < >  --  139 139 137 137 135  K   < >  --  6.0* 4.1 4.0 4.1 3.6  CL   < >  --  100 100 96* 97* 96*  CO2   < >  --  24 25 23 24 26   GLUCOSE   < >  --  132* 126* 119* 119* 134*  BUN   < >  --  34* 41* 31* 40* 20  CREATININE   < > 7.33* 7.96* 9.36* 7.64* 9.18* 6.44*  CALCIUM   < >  --  9.3 9.5 10.3 9.6 9.8  MG  --  2.4  --  2.3  --   --  1.9  PHOS  --   --   --   --   --  6.5*  --    < > = values in this interval not displayed.   GFR: Estimated Creatinine Clearance: 7.8 mL/min (A) (by C-G formula based on SCr of 6.44 mg/dL (H)). Liver Function Tests: Recent Labs  Lab 11/26/19 1112 11/27/19 1038 12/01/19 0512  AST 19 41  --   ALT 15 14  --   ALKPHOS 99 72  --   BILITOT 0.5 1.3*  --   PROT 7.0 6.0*  --   ALBUMIN 3.7 3.3* 3.1*   Recent Labs  Lab 11/26/19 1112  LIPASE 29   No results for input(s): AMMONIA in the last 168 hours. Coagulation Profile: Recent Labs  Lab 11/26/19 1112  INR 1.0   Cardiac Enzymes: No results for input(s): CKTOTAL, CKMB, CKMBINDEX, TROPONINI in the last 168 hours. BNP (last 3 results) No results for input(s): PROBNP in the last 8760 hours. HbA1C: No results for input(s): HGBA1C in the last 72 hours. CBG: Recent Labs  Lab 12/01/19 1638 12/01/19 2121 12/02/19 0621 12/02/19 1305 12/02/19 1631  GLUCAP 163* 148* 112* 128* 126*   Lipid Profile: No results for input(s): CHOL, HDL, LDLCALC, TRIG, CHOLHDL, LDLDIRECT in the last 72 hours. Thyroid Function Tests: No results for input(s): TSH, T4TOTAL, FREET4, T3FREE, THYROIDAB in the last 72 hours. Anemia Panel: No results for input(s): VITAMINB12, FOLATE, FERRITIN, TIBC, IRON, RETICCTPCT in the last 72 hours. Urine analysis:    Component Value Date/Time   COLORURINE YELLOW 11/29/2019 1716    APPEARANCEUR CLEAR 11/29/2019 1716   LABSPEC 1.018 11/29/2019 1716   PHURINE 7.0 11/29/2019 1716   GLUCOSEU 50 (A) 11/29/2019 1716   HGBUR NEGATIVE 11/29/2019 1716   [place] 11/29/2019 1716   BILIRUBINUR negative 02/19/2018 1643   KETONESUR NEGATIVE 11/29/2019 1716   PROTEINUR 100 (A) 11/29/2019 1716   UROBILINOGEN 0.2 02/19/2018 1643   UROBILINOGEN 1.0 08/22/2012 1402   NITRITE NEGATIVE 11/29/2019 1716   LEUKOCYTESUR NEGATIVE 11/29/2019 1716   Sepsis Labs: @LABRCNTIP (procalcitonin:4,lacticidven:4) ) Recent Results (from the past 240 hour(s))  Respiratory Panel by RT PCR (Flu A&B, Covid) - Nasopharyngeal Swab     Status: None   Collection Time: 11/26/19 11:12 AM   Specimen: Nasopharyngeal Swab  Result Value Ref Range Status   SARS Coronavirus 2 by RT PCR NEGATIVE NEGATIVE Final    Comment: (NOTE) SARS-CoV-2 target nucleic acids are NOT DETECTED.  The SARS-CoV-2 RNA is generally detectable in upper respiratoy specimens during the acute phase of infection. The lowest concentration of SARS-CoV-2 viral copies this assay can detect is 131 copies/mL. A negative result does not preclude SARS-Cov-2 infection and should not be used as the sole basis for treatment or other patient management decisions. A negative result may occur with  improper specimen collection/handling, submission of specimen other than nasopharyngeal swab, presence of viral mutation(s) within the areas targeted by this assay, and inadequate number of viral copies (<131 copies/mL). A negative result must be combined with clinical observations, patient history, and epidemiological information. The expected result is Negative.  Fact Sheet for Patients:  PinkCheek.be  Fact Sheet for Healthcare Providers:  GravelBags.it  This test is no t yet approved or cleared by the [place] FDA and  has been authorized for detection and/or diagnosis of  SARS-CoV-2 by FDA under an Emergency Use Authorization (EUA). This EUA will remain  in effect (meaning this test can be used) for the duration of the COVID-19 declaration under Section 564(b)(1) of the Act, 21 U.S.C. section 360bbb-3(b)(1), unless the authorization is terminated  or revoked sooner.     Influenza A by PCR NEGATIVE NEGATIVE Final   Influenza B by PCR NEGATIVE NEGATIVE Final    Comment: (NOTE) The Xpert Xpress SARS-CoV-2/FLU/RSV assay is intended as an aid in  the diagnosis of influenza from Nasopharyngeal swab specimens and  should not be used as a sole basis for treatment. Nasal washings and  aspirates are unacceptable for Xpert Xpress SARS-CoV-2/FLU/RSV  testing.  Fact Sheet for Patients: PinkCheek.be  Fact Sheet for Healthcare Providers: GravelBags.it  This test is not yet approved or cleared by the [place] FDA and  has been authorized for detection and/or diagnosis of SARS-CoV-2 by  FDA under an Emergency Use Authorization (EUA). This EUA will remain  in effect (meaning this test can be used) for the duration of the  Covid-19 declaration under Section 564(b)(1) of the Act, 21  U.S.C. section 360bbb-3(b)(1), unless the authorization is  terminated or revoked. Performed at [facility], [place] [address] [place], [place] [address]   MRSA PCR Screening     Status: None   Collection Time: 11/26/19  9:49 PM   Specimen: Nasal Mucosa; Nasopharyngeal  Result Value Ref Range Status   MRSA by PCR NEGATIVE NEGATIVE Final    Comment:        The GeneXpert MRSA Assay (FDA approved for NASAL specimens only), is one component of a comprehensive MRSA colonization surveillance program. It is not intended to diagnose MRSA infection nor to guide or monitor treatment for MRSA infections. Performed at [facility], [place] [address], [place], [place] [address]          Radiology Studies: DG  Orthopantogram  Result Date: 12/02/2019 CLINICAL DATA:  Poor dentition. EXAM: ORTHOPANTOGRAM/PANORAMIC COMPARISON:  None. FINDINGS: No fracture or dislocation is noted. Extremely poor dentition is noted with only a few teeth left in the mandible. No definite lytic destruction is noted. IMPRESSION: Extremely poor dentition. No definite lytic destruction is noted. Electronically Signed   By: Marijo Conception M.D.   On: 12/02/2019 11:31   CT CORONARY MORPH W/CTA COR W/SCORE W/CA W/CM &/OR WO/CM  Addendum Date: 12/02/2019   ADDENDUM REPORT: 12/02/2019 13:26 EXAM: OVER-READ INTERPRETATION  CT CHEST The following report is an over-read performed by radiologist Dr. Samara Snide [facility], [place] on 12/02/2019. This over-read does not include interpretation of cardiac or coronary anatomy or pathology. The coronary CTA interpretation by the cardiologist is attached. COMPARISON:  11/26/2019 chest CT angiogram. FINDINGS: Please see the separate concurrent chest CT angiogram report for details. IMPRESSION: Please see the separate concurrent chest CT angiogram report for details. Electronically Signed   By: Ilona Sorrel M.D.   On: 12/02/2019 13:26   Result Date: 12/02/2019 CLINICAL DATA:  Severe Aortic Stenosis. EXAM: Cardiac TAVR CT TECHNIQUE: The patient was scanned on a [employer]. A 120 kV retrospective scan was triggered in the descending thoracic aorta at 111 HU's. Gantry rotation speed was 250 msecs and collimation was .6 mm. No beta blockade or nitro were given. The 3D data set was reconstructed in 5% intervals of the R-R cycle. Systolic and diastolic phases were analyzed on a dedicated work station using MPR, MIP and VRT modes. The patient received 80 cc of contrast. FINDINGS: Image quality: Excellent. Noise artifact is: Limited. Valve Morphology: The aortic valve is tricuspid. The leaflets are severely calcified and severely thickened. Leaflet calcification appears diffuse. The leaflets exhibit  severely restricted movement in systole Aortic Valve Calcium score: 2336  Aortic annular dimension: Phase assessed: 20% Annular area: 462 mm2 Annular perimeter: 77.3 mm Max diameter: 26.1 mm Min diameter: 22.9 mm Annular and subannular calcification: None. Optimal coplanar projection: LAO 1 CRA 3 Coronary Artery Height above Annulus: Left Main: 15.9 mm Right Coronary: 16.3 mm Sinus of Valsalva Measurements: Non-coronary: 34 mm Right-coronary: 33 mm Left-coronary: 34 mm Sinus of Valsalva Height: Non-coronary: 26.0 mm Right-coronary: 23.2 mm Left-coronary: 22.5 mm Sinotubular Junction: 27 mm with mild to moderate calcifications. Ascending Thoracic Aorta: 31 mm with diffuse mixed density atherosclerosis noted. Coronary Arteries: CAC score of 802, which is 80th percentile for age- and sex-matched controls. Normal coronary origin. Right dominance. The study was performed without use of NTG and is insufficient for plaque evaluation. The following assessment was made nonetheless: Left main: The left main is a large caliber vessel with a normal take off from the left coronary cusp that trifurcates into a LAD, LCX, and ramus intermedius. There is minimal mixed density plaque (<25%). Left anterior descending artery: The proximal LAD contains mild mixed density plaque (25-49%). The mid LAD contains minimal calcified plaque (<25%). The distal LAD contains minimal non-calcified plaque (<25%). Ramus intermedius: Large vessel with minimal calcified plaque (<25%). Left circumflex artery: The LCX is non-dominant with minimal non-calcified plaque (<25%). Right coronary artery: Dominant with normal take off from the [place]. The proximal and mid RCA segments contain minimal calcified plaque (<25%). The distal RCA contains minimal non-calcified plaque. The RCA terminates as a patent PDA and PLV branch. Cardiac Morphology: Right Atrium: Right atrial size is within normal limits. Right Ventricle: The right ventricular cavity is within normal  limits. Left Atrium: Left atrial size is normal in size with no left atrial appendage filling defect. Left Ventricle: The ventricular cavity size is within normal limits. There are no stigmata of prior infarction. There is no abnormal filling defect. Normal left ventricular function, LVEF=72%. No regional wall motion abnormalities. Pulmonary arteries: Normal in size without proximal filling defect. Pulmonary veins: Normal pulmonary venous drainage. Pericardium: Normal thickness with no significant effusion or calcium present. Mitral Valve: The mitral valve is normal structure with mild mitral annular calcification. Extra-cardiac findings: See attached radiology report for non-cardiac structures. IMPRESSION: 1. 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 2. Annular measurements appropriate for 26 mm Edwards Sapien 3 TAVR 3. No significant annular or subannular calcifications. 4. Sufficient coronary to annulus distance. 5. Optimal Fluoroscopic Angle for Delivery: LAO 1 CRA 3 6. Coronary calcium score of 802 which is 80th percentile for age- and sex-matched controls. 7. 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 [place] T. Audie Box, MD Electronically Signed: By: Eleonore Chiquito On: 12/01/2019 22:06   CT ANGIO CHEST AORTA W/CM & OR WO/CM  Result Date: 12/02/2019 CLINICAL DATA:  Inpatient. Severe aortic stenosis. Pre-TAVR planning. EXAM: CT ANGIOGRAPHY CHEST, ABDOMEN AND PELVIS TECHNIQUE: Multidetector CT imaging through the chest, abdomen and pelvis was performed using the standard protocol during bolus administration of intravenous contrast. Multiplanar reconstructed images and MIPs were obtained and reviewed to evaluate the vascular anatomy. CONTRAST:  56mL OMNIPAQUE IOHEXOL 350 MG/ML SOLN COMPARISON:  11/26/2019 CT angiogram of the chest, abdomen and pelvis. FINDINGS: CTA  CHEST FINDINGS Cardiovascular: Normal heart size. No significant pericardial effusion/thickening. Diffuse thickening and coarse calcification of the aortic valve. Three-vessel coronary atherosclerosis. Atherosclerotic nonaneurysmal thoracic aorta. Normal caliber pulmonary arteries. No central pulmonary emboli.  Mediastinum/Nodes: Scattered subcentimeter hypodense bilateral thyroid nodules. Not clinically significant; no follow-up imaging recommended (ref: J Am Coll Radiol. 2015 Feb;12(2): 143-50). Unremarkable esophagus. No pathologically enlarged axillary, mediastinal or hilar lymph nodes. Lungs/Pleura: No pneumothorax. No pleural effusion. No acute consolidative airspace disease or lung masses. Anterior left upper lobe tiny 3 mm solid pulmonary nodule (series 5/image 46), stable since recent chest CT. No additional significant pulmonary nodules. Musculoskeletal: No aggressive appearing focal osseous lesions. Moderate thoracic spondylosis. Symmetric moderate bilateral gynecomastia, stable. CTA ABDOMEN AND PELVIS FINDINGS Hepatobiliary: Normal liver with no liver mass. Stable distended gallbladder with layering subcentimeter calcified gallstones. No gallbladder wall thickening or pericholecystic fluid. No biliary ductal dilatation. Pancreas: Normal, with no mass or duct dilation. Spleen: Normal size. No mass. Adrenals/Urinary Tract: Left adrenal 2.6 cm nodule with density 80 HU and right adrenal 1.7 cm nodule with density 57 HU, both characterized as benign adenomas on recent noncontrast chest CT. No overt hydronephrosis. No contour deforming renal masses. Normal nondistended bladder. Stomach/Bowel: Normal non-distended stomach. Normal caliber small bowel with no small bowel wall thickening. Normal appendix. Normal large bowel with no diverticulosis, large bowel wall thickening or pericolonic fat stranding. Vascular/Lymphatic: Atherosclerotic nonaneurysmal abdominal aorta. No pathologically enlarged lymph nodes in  the abdomen or pelvis. Reproductive: Mild prostatomegaly. Brachytherapy seeds scattered throughout the prostate. Other: No pneumoperitoneum, ascites or focal fluid collection. Small fat containing left inguinal hernia. Musculoskeletal: No aggressive appearing focal osseous lesions. Mild lumbar spondylosis. VASCULAR MEASUREMENTS PERTINENT TO TAVR: AORTA: Minimal Aortic Diameter-14.8 x 13.9 mm Severity of Aortic Calcification-moderate to severe RIGHT PELVIS: Right Common Iliac Artery - Minimal Diameter-10.2 x 6.0 mm Tortuosity-mild Calcification-severe Right External Iliac Artery - Minimal Diameter-5.5 x 4.6 mm Tortuosity-mild-to-moderate Calcification-severe Right Common Femoral Artery - Minimal Diameter-7.3 x 6.7 mm Tortuosity-mild Calcification-moderate LEFT PELVIS: Left Common Iliac Artery - Minimal Diameter-9.3 x 8.2 mm Tortuosity-mild Calcification-severe Left External Iliac Artery - Minimal Diameter-7.1 x 5.1 mm Tortuosity-mild mild Calcification-severe Left Common Femoral Artery - Minimal Diameter-8.3 x 7.0 mm Tortuosity-mild Calcification-moderate Review of the MIP images confirms the above findings. IMPRESSION: 1. Vascular findings and measurements pertinent to potential TAVR procedure, as detailed. 2. Diffuse thickening and coarse calcification of the aortic valve, compatible with the reported history of severe aortic stenosis. 3. Three-vessel coronary atherosclerosis. 4. Tiny 3 mm solid left upper lobe pulmonary nodule. No follow-up needed if patient is low-risk. Non-contrast chest CT can be considered in 12 months if patient is high-risk. This recommendation follows the consensus statement: Guidelines for Management of Incidental Pulmonary Nodules Detected on CT Images: From the Fleischner Society 2017; Radiology 2017; 284:228-243. 5. Cholelithiasis. 6. Bilateral adrenal adenomas. 7. Mild prostatomegaly. 8. Small fat containing left inguinal hernia. 9. Aortic Atherosclerosis (ICD10-I70.0). Electronically  Signed   By: Ilona Sorrel M.D.   On: 12/02/2019 13:55   VAS US CAROTID  Result Date: 12/02/2019 Carotid Arterial Duplex Study Indications:       Pre-TAVR. Risk Factors:      Hypertension, hyperlipidemia. Comparison Study:  No prior studies. Performing Technologist: Vullo Hum RVT  Examination Guidelines: A complete evaluation includes B-mode imaging, spectral Doppler, color Doppler, and power Doppler as needed of all accessible portions of each vessel. Bilateral testing is considered an integral part of a complete examination. Limited examinations for reoccurring indications may be performed as noted.  Right Carotid Findings: +----------+--------+--------+--------+-----------------------+--------+           PSV cm/sEDV cm/sStenosisPlaque Description     Comments +----------+--------+--------+--------+-----------------------+--------+ CCA Prox  170  14              smooth and heterogenous         +----------+--------+--------+--------+-----------------------+--------+ CCA Distal97      12              smooth and heterogenous         +----------+--------+--------+--------+-----------------------+--------+ ICA Prox  37      7               smooth and heterogenous         +----------+--------+--------+--------+-----------------------+--------+ ICA Distal50      12                                     tortuous +----------+--------+--------+--------+-----------------------+--------+ ECA       77      7                                               +----------+--------+--------+--------+-----------------------+--------+ +----------+--------+-------+--------+-------------------+           PSV cm/sEDV cmsDescribeArm Pressure (mmHG) +----------+--------+-------+--------+-------------------+ [id number]                                        +----------+--------+-------+--------+-------------------+ +---------+--------+--+--------+-+---------+ VertebralPSV  cm/s38EDV cm/s8Antegrade +---------+--------+--+--------+-+---------+  Left Carotid Findings: +----------+--------+--------+--------+-----------------------+--------+           PSV cm/sEDV cm/sStenosisPlaque Description     Comments +----------+--------+--------+--------+-----------------------+--------+ CCA Prox  161     22              smooth and heterogenous         +----------+--------+--------+--------+-----------------------+--------+ CCA Distal77      15              smooth and heterogenous         +----------+--------+--------+--------+-----------------------+--------+ ICA Prox  61      10              calcific                        +----------+--------+--------+--------+-----------------------+--------+ ICA Distal41      10                                     tortuous +----------+--------+--------+--------+-----------------------+--------+ ECA       69      9                                               +----------+--------+--------+--------+-----------------------+--------+ +----------+--------+--------+--------+-------------------+           PSV cm/sEDV cm/sDescribeArm Pressure (mmHG) +----------+--------+--------+--------+-------------------+ Subclavian229                                         +----------+--------+--------+--------+-------------------+ +---------+--------+--+--------+--+---------+ VertebralPSV cm/s54EDV cm/s11Antegrade +---------+--------+--+--------+--+---------+   Summary: Right Carotid: Velocities in the right ICA are consistent with a 1-39% stenosis. Left Carotid: Velocities in the left ICA  are consistent with a 1-39% stenosis. Vertebrals: Bilateral vertebral arteries demonstrate antegrade flow. *See table(s) above for measurements and observations.     Preliminary    CT Angio Abd/Pel w/ and/or w/o  Result Date: 12/02/2019 CLINICAL DATA:  Inpatient. Severe aortic stenosis. Pre-TAVR planning. EXAM: CT  ANGIOGRAPHY CHEST, ABDOMEN AND PELVIS TECHNIQUE: Multidetector CT imaging through the chest, abdomen and pelvis was performed using the standard protocol during bolus administration of intravenous contrast. Multiplanar reconstructed images and MIPs were obtained and reviewed to evaluate the vascular anatomy. CONTRAST:  7mL OMNIPAQUE IOHEXOL 350 MG/ML SOLN COMPARISON:  11/26/2019 CT angiogram of the chest, abdomen and pelvis. FINDINGS: CTA CHEST FINDINGS Cardiovascular: Normal heart size. No significant pericardial effusion/thickening. Diffuse thickening and coarse calcification of the aortic valve. Three-vessel coronary atherosclerosis. Atherosclerotic nonaneurysmal thoracic aorta. Normal caliber pulmonary arteries. No central pulmonary emboli. Mediastinum/Nodes: Scattered subcentimeter hypodense bilateral thyroid nodules. Not clinically significant; no follow-up imaging recommended (ref: J Am Coll Radiol. 2015 Feb;12(2): 143-50). Unremarkable esophagus. No pathologically enlarged axillary, mediastinal or hilar lymph nodes. Lungs/Pleura: No pneumothorax. No pleural effusion. No acute consolidative airspace disease or lung masses. Anterior left upper lobe tiny 3 mm solid pulmonary nodule (series 5/image 46), stable since recent chest CT. No additional significant pulmonary nodules. Musculoskeletal: No aggressive appearing focal osseous lesions. Moderate thoracic spondylosis. Symmetric moderate bilateral gynecomastia, stable. CTA ABDOMEN AND PELVIS FINDINGS Hepatobiliary: Normal liver with no liver mass. Stable distended gallbladder with layering subcentimeter calcified gallstones. No gallbladder wall thickening or pericholecystic fluid. No biliary ductal dilatation. Pancreas: Normal, with no mass or duct dilation. Spleen: Normal size. No mass. Adrenals/Urinary Tract: Left adrenal 2.6 cm nodule with density 80 HU and right adrenal 1.7 cm nodule with density 57 HU, both characterized as benign adenomas on recent  noncontrast chest CT. No overt hydronephrosis. No contour deforming renal masses. Normal nondistended bladder. Stomach/Bowel: Normal non-distended stomach. Normal caliber small bowel with no small bowel wall thickening. Normal appendix. Normal large bowel with no diverticulosis, large bowel wall thickening or pericolonic fat stranding. Vascular/Lymphatic: Atherosclerotic nonaneurysmal abdominal aorta. No pathologically enlarged lymph nodes in the abdomen or pelvis. Reproductive: Mild prostatomegaly. Brachytherapy seeds scattered throughout the prostate. Other: No pneumoperitoneum, ascites or focal fluid collection. Small fat containing left inguinal hernia. Musculoskeletal: No aggressive appearing focal osseous lesions. Mild lumbar spondylosis. VASCULAR MEASUREMENTS PERTINENT TO TAVR: AORTA: Minimal Aortic Diameter-14.8 x 13.9 mm Severity of Aortic Calcification-moderate to severe RIGHT PELVIS: Right Common Iliac Artery - Minimal Diameter-10.2 x 6.0 mm Tortuosity-mild Calcification-severe Right External Iliac Artery - Minimal Diameter-5.5 x 4.6 mm Tortuosity-mild-to-moderate Calcification-severe Right Common Femoral Artery - Minimal Diameter-7.3 x 6.7 mm Tortuosity-mild Calcification-moderate LEFT PELVIS: Left Common Iliac Artery - Minimal Diameter-9.3 x 8.2 mm Tortuosity-mild Calcification-severe Left External Iliac Artery - Minimal Diameter-7.1 x 5.1 mm Tortuosity-mild mild Calcification-severe Left Common Femoral Artery - Minimal Diameter-8.3 x 7.0 mm Tortuosity-mild Calcification-moderate Review of the MIP images confirms the above findings. IMPRESSION: 1. Vascular findings and measurements pertinent to potential TAVR procedure, as detailed. 2. Diffuse thickening and coarse calcification of the aortic valve, compatible with the reported history of severe aortic stenosis. 3. Three-vessel coronary atherosclerosis. 4. Tiny 3 mm solid left upper lobe pulmonary nodule. No follow-up needed if patient is low-risk.  Non-contrast chest CT can be considered in 12 months if patient is high-risk. This recommendation follows the consensus statement: Guidelines for Management of Incidental Pulmonary Nodules Detected on CT Images: From the Fleischner Society 2017; Radiology 2017; 284:228-243. 5. Cholelithiasis. 6. Bilateral adrenal adenomas. 7. Mild  prostatomegaly. 8. Small fat containing left inguinal hernia. 9. Aortic Atherosclerosis (ICD10-I70.0). Electronically Signed   By: Ilona Sorrel M.D.   On: 12/02/2019 13:55      Scheduled Meds: . apraclonidine  1 drop Right Eye TID  . atorvastatin  10 mg Oral Daily  . carvedilol  3.125 mg Oral BID WC  . Chlorhexidine Gluconate Cloth  6 each Topical Q0600  . [START ON 12/03/2019] Chlorhexidine Gluconate Cloth  6 each Topical Q0600  . dorzolamide-timolol  1 drop Right Eye BID  . doxercalciferol  7 mcg Intravenous Q T,Th,Sa-HD  . ferric citrate  420 mg Oral TID WC  . gabapentin  100 mg Oral QHS  . heparin  5,000 Units Subcutaneous Q8H  . hydrALAZINE  25 mg Oral BID  . insulin aspart  0-5 Units Subcutaneous QHS  . insulin aspart  0-6 Units Subcutaneous TID WC  . isosorbide mononitrate  30 mg Oral Daily  . latanoprost  1 drop Right Eye QHS  . multivitamin  1 tablet Oral QHS  . pantoprazole  40 mg Oral Daily  . pilocarpine  1 drop Right Eye QID  . sodium chloride flush  3 mL Intravenous Q12H  . tamsulosin  0.4 mg Oral QHS   Continuous Infusions: . sodium chloride    . sodium chloride       LOS: 6 days      Debbe Odea, MD [facility] Pager: [email or web address] 12/02/2019, 5:43 PM

## 2019-12-02 NOTE — Progress Notes (Signed)
CSW spoke with daughter Elijah White by phone in reference to SNF placement. Elijah White stated she knows her father doesn't want to go to a SNF but she would be open to whatever means he is safe. Elijah White stated her home is not set up for him to come to her home. 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 [name] asked me to speak with pt again in reference to SNF placement. CSW met with pt at beside, discussed dc plans, pt stated he is not interested in going to a SNF, he is going home. CSW followed up with [name] to advise on what pt stated. Elijah White said she understood and thanked CSW for her assistance, CSW advised that if any further assistance was needed not to hesitate to reach out.

## 2019-12-02 NOTE — Progress Notes (Signed)
PT Cancellation Note  Patient Details Name: DEPAUL ARIZPE MRN: [id number] DOB: 03-17-1940   Cancelled Treatment:     initiated education regarding pre-TAVR assessment, pt in agreement; session disrupted with pt going to x-ray; will attempt in PM time permitting  Lyanne Co, DPT [facility] [phone]   [place] 12/02/2019, 11:31 AM

## 2019-12-03 ENCOUNTER — Inpatient Hospital Stay (HOSPITAL_COMMUNITY)
Admission: EM | Disposition: A | Discharge status: Home / Self Care | Payer: Self-pay | Source: Ambulatory Visit | Attending: Internal Medicine

## 2019-12-03 ENCOUNTER — Encounter (HOSPITAL_COMMUNITY): Payer: Self-pay | Admitting: Cardiovascular Disease

## 2019-12-03 DIAGNOSIS — Z992 Dependence on renal dialysis: Secondary | ICD-10-CM | POA: Diagnosis not present

## 2019-12-03 DIAGNOSIS — N1330 Unspecified hydronephrosis: Secondary | ICD-10-CM

## 2019-12-03 DIAGNOSIS — N186 End stage renal disease: Secondary | ICD-10-CM | POA: Diagnosis not present

## 2019-12-03 DIAGNOSIS — I2510 Atherosclerotic heart disease of native coronary artery without angina pectoris: Secondary | ICD-10-CM

## 2019-12-03 DIAGNOSIS — I350 Nonrheumatic aortic (valve) stenosis: Secondary | ICD-10-CM

## 2019-12-03 DIAGNOSIS — I129 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 kidney disease, or unspecified chronic kidney disease: Secondary | ICD-10-CM | POA: Diagnosis not present

## 2019-12-03 HISTORY — PX: RIGHT/LEFT HEART CATH AND CORONARY ANGIOGRAPHY: CATH118266

## 2019-12-03 LAB — POCT I-STAT EG7
Acid-Base Excess: 4 mmol/L — ABNORMAL HIGH (ref 0.0–2.0)
Bicarbonate: 29.3 mmol/L — ABNORMAL HIGH (ref 20.0–28.0)
Calcium, Ion: 1.29 mmol/L (ref 1.15–1.40)
HCT: 35 % — ABNORMAL LOW (ref 39.0–52.0)
Hemoglobin: 11.9 g/dL — ABNORMAL LOW (ref 13.0–17.0)
O2 Saturation: 79 %
Potassium: 3.7 mmol/L (ref 3.5–5.1)
Sodium: 136 mmol/L (ref 135–145)
TCO2: 31 mmol/L (ref 22–32)
pCO2, Ven: 48.2 mmHg (ref 44.0–60.0)
pH, Ven: 7.392 (ref 7.250–7.430)
pO2, Ven: 44 mmHg (ref 32.0–45.0)

## 2019-12-03 LAB — BASIC METABOLIC PANEL
Anion gap: 13 (ref 5–15)
BUN: 27 mg/dL — ABNORMAL HIGH (ref 8–23)
CO2: 25 mmol/L (ref 22–32)
Calcium: 10.1 mg/dL (ref 8.9–10.3)
Chloride: 94 mmol/L — ABNORMAL LOW (ref 98–111)
Creatinine, Ser: 8.02 mg/dL — ABNORMAL HIGH (ref 0.61–1.24)
GFR calc Af Amer: 7 mL/min — ABNORMAL LOW (ref >60)
GFR calc non Af Amer: 6 mL/min — ABNORMAL LOW (ref >60)
Glucose, Bld: 118 mg/dL — ABNORMAL HIGH (ref 70–99)
Potassium: 3.7 mmol/L (ref 3.5–5.1)
Sodium: 132 mmol/L — ABNORMAL LOW (ref 135–145)

## 2019-12-03 LAB — CBC WITH DIFFERENTIAL/PLATELET
Abs Immature Granulocytes: 0.01 10*3/uL (ref 0.00–0.07)
Basophils Absolute: 0 10*3/uL (ref 0.0–0.1)
Basophils Relative: 1 %
Eosinophils Absolute: 0.2 10*3/uL (ref 0.0–0.5)
Eosinophils Relative: 3 %
HCT: 33.4 % — ABNORMAL LOW (ref 39.0–52.0)
Hemoglobin: 11.1 g/dL — ABNORMAL LOW (ref 13.0–17.0)
Immature Granulocytes: 0 %
Lymphocytes Relative: 36 %
Lymphs Abs: 1.8 10*3/uL (ref 0.7–4.0)
MCH: 33.7 pg (ref 26.0–34.0)
MCHC: 33.2 g/dL (ref 30.0–36.0)
MCV: 101.5 fL — ABNORMAL HIGH (ref 80.0–100.0)
Monocytes Absolute: 0.8 10*3/uL (ref 0.1–1.0)
Monocytes Relative: 15 %
Neutro Abs: 2.2 10*3/uL (ref 1.7–7.7)
Neutrophils Relative %: 45 %
Platelets: 239 10*3/uL (ref 150–400)
RBC: 3.29 MIL/uL — ABNORMAL LOW (ref 4.22–5.81)
RDW: 12.6 % (ref 11.5–15.5)
WBC: 4.9 10*3/uL (ref 4.0–10.5)
nRBC: 0 % (ref 0.0–0.2)

## 2019-12-03 LAB — GLUCOSE, CAPILLARY
Glucose-Capillary: 120 mg/dL — ABNORMAL HIGH (ref 70–99)
Glucose-Capillary: 174 mg/dL — ABNORMAL HIGH (ref 70–99)

## 2019-12-03 LAB — ALDOSTERONE + RENIN ACTIVITY W/ RATIO
ALDO / PRA Ratio: <0.4 (ref 0.0–30.0)
Aldosterone: <1 ng/dL (ref 0.0–30.0)
PRA LC/MS/MS: 2.273 ng/mL/hr (ref 0.167–5.380)

## 2019-12-03 LAB — POCT I-STAT 7, (LYTES, BLD GAS, ICA,H+H)
Acid-Base Excess: 4 mmol/L — ABNORMAL HIGH (ref 0.0–2.0)
Bicarbonate: 29 mmol/L — ABNORMAL HIGH (ref 20.0–28.0)
Calcium, Ion: 1.22 mmol/L (ref 1.15–1.40)
HCT: 34 % — ABNORMAL LOW (ref 39.0–52.0)
Hemoglobin: 11.6 g/dL — ABNORMAL LOW (ref 13.0–17.0)
O2 Saturation: 100 %
Potassium: 3.5 mmol/L (ref 3.5–5.1)
Sodium: 137 mmol/L (ref 135–145)
TCO2: 30 mmol/L (ref 22–32)
pCO2 arterial: 45.3 mmHg (ref 32.0–48.0)
pH, Arterial: 7.415 (ref 7.350–7.450)
pO2, Arterial: 220 mmHg — ABNORMAL HIGH (ref 83.0–108.0)

## 2019-12-03 SURGERY — RIGHT/LEFT HEART CATH AND CORONARY ANGIOGRAPHY
Anesthesia: LOCAL

## 2019-12-03 MED ORDER — SODIUM CHLORIDE 0.9% FLUSH: 3.0000 mL | INTRAVENOUS | Status: DC | PRN

## 2019-12-03 MED ORDER — VERAPAMIL HCL 2.5 MG/ML IV SOLN
INTRAVENOUS | Status: AC
  Filled 2019-12-03: qty 2

## 2019-12-03 MED ORDER — MIDAZOLAM HCL 2 MG/2ML IJ SOLN
INTRAMUSCULAR | Status: AC
  Filled 2019-12-03: qty 2

## 2019-12-03 MED ORDER — HYDRALAZINE HCL 20 MG/ML IJ SOLN: 10.0000 mg | INTRAMUSCULAR | Status: AC | PRN

## 2019-12-03 MED ORDER — LIDOCAINE HCL (PF) 1 % IJ SOLN
INTRAMUSCULAR | Status: AC
  Filled 2019-12-03: qty 30

## 2019-12-03 MED ORDER — IOHEXOL 350 MG/ML SOLN
INTRAVENOUS | Status: DC | PRN
  Administered 2019-12-03: 50 mL

## 2019-12-03 MED ORDER — LIDOCAINE HCL (PF) 1 % IJ SOLN
INTRAMUSCULAR | Status: DC | PRN
  Administered 2019-12-03: 15 mL

## 2019-12-03 MED ORDER — HEPARIN (PORCINE) IN NACL 1000-0.9 UT/500ML-% IV SOLN
INTRAVENOUS | Status: AC
  Filled 2019-12-03: qty 1000

## 2019-12-03 MED ORDER — HEPARIN SODIUM (PORCINE) 1000 UNIT/ML IJ SOLN
INTRAMUSCULAR | Status: AC
  Filled 2019-12-03: qty 1

## 2019-12-03 MED ORDER — SODIUM CHLORIDE 0.9% FLUSH
3.0000 mL | Freq: Two times a day (BID) | INTRAVENOUS | Status: DC
  Administered 2019-12-03: 3 mL via INTRAVENOUS

## 2019-12-03 MED ORDER — FENTANYL CITRATE (PF) 100 MCG/2ML IJ SOLN
INTRAMUSCULAR | Status: AC
  Filled 2019-12-03: qty 2

## 2019-12-03 MED ORDER — ASPIRIN EC 81 MG PO TBEC
81.0000 mg | DELAYED_RELEASE_TABLET | Freq: Once | ORAL | Status: AC
  Administered 2019-12-03: 81 mg via ORAL
  Filled 2019-12-03: qty 1

## 2019-12-03 MED ORDER — TAMSULOSIN HCL 0.4 MG PO CAPS: 0.4000 mg | ORAL_CAPSULE | Freq: Every day | ORAL | 0 refills | Status: DC

## 2019-12-03 MED ORDER — DOXERCALCIFEROL 4 MCG/2ML IV SOLN
INTRAVENOUS | Status: AC
  Administered 2019-12-03: 7 ug via INTRAVENOUS
  Filled 2019-12-03: qty 4

## 2019-12-03 MED ORDER — HEPARIN (PORCINE) IN NACL 1000-0.9 UT/500ML-% IV SOLN
INTRAVENOUS | Status: DC | PRN
  Administered 2019-12-03 (×2): 500 mL

## 2019-12-03 MED ORDER — SODIUM CHLORIDE 0.9 % IV SOLN: 250.0000 mL | INTRAVENOUS | Status: DC | PRN

## 2019-12-03 SURGICAL SUPPLY — 15 items
CATH INFINITI 5FR AL1 (CATHETERS) ×1 IMPLANT
CATH INFINITI 5FR MULTPACK ANG (CATHETERS) ×1 IMPLANT
CATH SWAN GANZ 7F STRAIGHT (CATHETERS) ×1 IMPLANT
CLOSURE MYNX CONTROL 5F (Vascular Products) ×1 IMPLANT
KIT HEART LEFT (KITS) ×2 IMPLANT
KIT MICROPUNCTURE NIT STIFF (SHEATH) ×1 IMPLANT
PACK CARDIAC CATHETERIZATION (CUSTOM PROCEDURE TRAY) ×2 IMPLANT
SHEATH PINNACLE 5F 10CM (SHEATH) ×1 IMPLANT
SHEATH PINNACLE 7F 10CM (SHEATH) ×1 IMPLANT
SHEATH PROBE COVER 6X72 (BAG) ×1 IMPLANT
TRANSDUCER W/STOPCOCK (MISCELLANEOUS) ×2 IMPLANT
TUBING CIL FLEX 10 FLL-RA (TUBING) ×2 IMPLANT
WIRE EMERALD 3MM-J .025X260CM (WIRE) ×1 IMPLANT
WIRE EMERALD 3MM-J .035X150CM (WIRE) ×1 IMPLANT
WIRE EMERALD ST .035X150CM (WIRE) ×1 IMPLANT

## 2019-12-03 NOTE — Interval H&P Note (Signed)
History and Physical Interval Note:  12/03/2019 7:33 AM  Elijah White  has presented today for surgery, with the diagnosis of aortic stenosis.  The various methods of treatment have been discussed with the patient and family. After consideration of risks, benefits and other options for treatment, the patient has consented to  Procedure(s): RIGHT/LEFT HEART CATH AND CORONARY ANGIOGRAPHY (N/A) as a surgical intervention.  The patient's history has been reviewed, patient examined, no change in status, stable for surgery.  I have reviewed the patient's chart and labs.  Questions were answered to the patient's satisfaction.    Cath Lab Visit (complete for each Cath Lab visit)  Clinical Evaluation Leading to the Procedure:   ACS: No.  Non-ACS:    Anginal Classification: CCS II  Anti-ischemic medical therapy: Maximal Therapy (2 or more classes of medications)  Non-Invasive Test Results: No non-invasive testing performed  Prior CABG: No previous CABG        Lauree Chandler

## 2019-12-03 NOTE — Progress Notes (Signed)
Cardiology Progress Note  Patient ID: Elijah White MRN: [id number] DOB: February 09, 1941 Date of Encounter: 12/03/2019  Primary Cardiologist: No primary care provider on file.  Subjective   Chief Complaint: Sleepy.  No complaints.  HPI: Left and right heart cath today.  Nonobstructive CAD.  Doing well.  Laying flat when I saw him after his procedure.  ROS:  All other ROS reviewed and negative. Pertinent positives noted in the HPI.     Inpatient Medications  Scheduled Meds:  apraclonidine  1 drop Right Eye TID   atorvastatin  10 mg Oral Daily   carvedilol  3.125 mg Oral BID WC   Chlorhexidine Gluconate Cloth  6 each Topical Q0600   dorzolamide-timolol  1 drop Right Eye BID   doxercalciferol  7 mcg Intravenous Q T,Th,Sa-HD   ferric citrate  420 mg Oral TID WC   gabapentin  100 mg Oral QHS   heparin  5,000 Units Subcutaneous Q8H   hydrALAZINE  25 mg Oral BID   insulin aspart  0-5 Units Subcutaneous QHS   insulin aspart  0-6 Units Subcutaneous TID WC   isosorbide mononitrate  30 mg Oral Daily   latanoprost  1 drop Right Eye QHS   multivitamin  1 tablet Oral QHS   pantoprazole  40 mg Oral Daily   pilocarpine  1 drop Right Eye QID   sodium chloride flush  3 mL Intravenous Q12H   sodium chloride flush  3 mL Intravenous Q12H   tamsulosin  0.4 mg Oral QHS   Continuous Infusions:  sodium chloride     PRN Meds: sodium chloride, acetaminophen **OR** acetaminophen, diclofenac Sodium, hydrALAZINE, hydrALAZINE, hydrocortisone cream, HYDROmorphone (DILAUDID) injection, hydroxypropyl methylcellulose / hypromellose, ondansetron **OR** ondansetron (ZOFRAN) IV, sodium chloride flush   Vital Signs   Vitals:   12/03/19 0845 12/03/19 1010 12/03/19 1027 12/03/19 1055  BP: (!) 155/65 126/61 (!) 124/57 (!) 125/59  Pulse: 74 70 69 66  Resp: 18 14  16   Temp: 97.8 F (36.6 C)     TempSrc: Oral     SpO2: 100% 100%  98%  Weight:      Height:       No intake or output  data in the 24 hours ending 12/03/19 1107 Last 3 Weights 12/03/2019 12/02/2019 12/01/2019  Weight (lbs) 166 lb 7.2 oz 173 lb 11.6 oz 165 lb 9.1 oz  Weight (kg) 75.5 kg 78.8 kg 75.1 kg      Telemetry  Overnight telemetry shows sinus rhythm with heart rate in the 60s, which I personally reviewed.   ECG  The most recent ECG shows normal sinus rhythm, which I personally reviewed.   Physical Exam   Vitals:   12/03/19 0845 12/03/19 1010 12/03/19 1027 12/03/19 1055  BP: (!) 155/65 126/61 (!) 124/57 (!) 125/59  Pulse: 74 70 69 66  Resp: 18 14  16   Temp: 97.8 F (36.6 C)     TempSrc: Oral     SpO2: 100% 100%  98%  Weight:      Height:       No intake or output data in the 24 hours ending 12/03/19 1107  Last 3 Weights 12/03/2019 12/02/2019 12/01/2019  Weight (lbs) 166 lb 7.2 oz 173 lb 11.6 oz 165 lb 9.1 oz  Weight (kg) 75.5 kg 78.8 kg 75.1 kg    Body mass index is 32.51 kg/m.  General: Well nourished, well developed, in no acute distress Head: Atraumatic, normal size  Eyes: PEERLA, EOMI  Neck: Supple,  no JVD Endocrine: No thryomegaly Cardiac: Normal S1, S2; 3 out of 6 systolic ejection murmur, late peaking, radiates into carotids Lungs: Clear to auscultation bilaterally, no wheezing, rhonchi or rales  Abd: Soft, nontender, no hepatomegaly  Ext: No edema, pulses 2+ Musculoskeletal: No deformities, BUE and BLE strength normal and equal Skin: Warm and dry, no rashes   Neuro: Alert and oriented to person, place, time, and situation, CNII-XII grossly intact, no focal deficits  Psych: Normal mood and affect   Labs  High Sensitivity Troponin:   Recent Labs  Lab 11/26/19 1112 11/26/19 1312 11/26/19 1816  TROPONINIHS 36* 37* 41*     Cardiac EnzymesNo results for input(s): TROPONINI in the last 168 hours. No results for input(s): TROPIPOC in the last 168 hours.  Chemistry Recent Labs  Lab 11/26/19 1112 11/26/19 1816 11/27/19 1038 11/28/19 0248 12/01/19 0512 12/01/19 0512  12/02/19 1215 12/03/19 0710 12/03/19 0800  NA 140  --  139   < > 137   < > 135 132* 136   137  K 4.1  --  6.0*   < > 4.1   < > 3.6 3.7 3.7   3.5  CL 100  --  100   < > 97*  --  96* 94*  --   CO2 29  --  24   < > 24  --  26 25  --   GLUCOSE 199*  --  132*   < > 119*  --  134* 118*  --   BUN 27*  --  34*   < > 40*  --  20 27*  --   CREATININE 6.92*   < > 7.96*   < > 9.18*  --  6.44* 8.02*  --   CALCIUM 9.8  --  9.3   < > 9.6  --  9.8 10.1  --   PROT 7.0  --  6.0*  --   --   --   --   --   --   ALBUMIN 3.7  --  3.3*  --  3.1*  --   --   --   --   AST 19  --  41  --   --   --   --   --   --   ALT 15  --  14  --   --   --   --   --   --   ALKPHOS 99  --  72  --   --   --   --   --   --   BILITOT 0.5  --  1.3*  --   --   --   --   --   --   GFRNONAA 7*   < > 6*   < > 5*  --  7* 6*  --   GFRAA 8*   < > 7*   < > 6*  --  9* 7*  --   ANIONGAP 11  --  15   < > 16*  --  13 13  --    < > = values in this interval not displayed.    Hematology Recent Labs  Lab 12/01/19 0858 12/01/19 0858 12/02/19 1215 12/03/19 0751 12/03/19 0800  WBC 6.2  --  4.7 4.9  --   RBC 3.30*  --  3.54* 3.29*  --   HGB 10.9*   < > 11.6* 11.1* 11.9*   11.6*  HCT 33.8*   < >  35.7* 33.4* 35.0*   34.0*  MCV 102.4*  --  100.8* 101.5*  --   MCH 33.0  --  32.8 33.7  --   MCHC 32.2  --  32.5 33.2  --   RDW 12.6  --  12.6 12.6  --   PLT 228  --  212 239  --    < > = values in this interval not displayed.   BNPNo results for input(s): BNP, PROBNP in the last 168 hours.  DDimer No results for input(s): DDIMER in the last 168 hours.   Radiology  DG Orthopantogram  Result Date: 12/02/2019 CLINICAL DATA:  Poor dentition. EXAM: ORTHOPANTOGRAM/PANORAMIC COMPARISON:  None. FINDINGS: No fracture or dislocation is noted. Extremely poor dentition is noted with only a few teeth left in the mandible. No definite lytic destruction is noted. IMPRESSION: Extremely poor dentition. No definite lytic destruction is noted. Electronically  Signed   By: Marijo Conception M.D.   On: 12/02/2019 11:31   CARDIAC CATHETERIZATION  Result Date: 12/03/2019  Prox RCA to Mid RCA lesion is 20% stenosed.  Dist RCA lesion is 20% stenosed.  RPAV lesion is 40% stenosed.  Mid Cx lesion is 20% stenosed.  Prox LAD lesion is 30% stenosed.  1. Mild non-obstructive CAD 2. Severe aortic stenosis by echo (cath mean gradient 26.8 mmHg0 Recommendations: will continue workup for TAVR. He has had his scans. He can likely be discharged home later today and we will complete his TAVR workup as an outpatient.   CT CORONARY MORPH W/CTA COR W/SCORE W/CA W/CM &/OR WO/CM  Addendum Date: 12/02/2019   ADDENDUM REPORT: 12/02/2019 13:26 EXAM: OVER-READ INTERPRETATION  CT CHEST The following report is an over-read performed by radiologist Dr. Samara Snide [facility], [place] on 12/02/2019. This over-read does not include interpretation of cardiac or coronary anatomy or pathology. The coronary CTA interpretation by the cardiologist is attached. COMPARISON:  11/26/2019 chest CT angiogram. FINDINGS: Please see the separate concurrent chest CT angiogram report for details. IMPRESSION: Please see the separate concurrent chest CT angiogram report for details. Electronically Signed   By: Ilona Sorrel M.D.   On: 12/02/2019 13:26   Result Date: 12/02/2019 CLINICAL DATA:  Severe Aortic Stenosis. EXAM: Cardiac TAVR CT TECHNIQUE: The patient was scanned on a [employer]. A 120 kV retrospective scan was triggered in the descending thoracic aorta at 111 HU's. Gantry rotation speed was 250 msecs and collimation was .6 mm. No beta blockade or nitro were given. The 3D data set was reconstructed in 5% intervals of the R-R cycle. Systolic and diastolic phases were analyzed on a dedicated work station using MPR, MIP and VRT modes. The patient received 80 cc of contrast. FINDINGS: Image quality: Excellent. Noise artifact is: Limited. Valve Morphology: The aortic valve is tricuspid.  The leaflets are severely calcified and severely thickened. Leaflet calcification appears diffuse. The leaflets exhibit severely restricted movement in systole Aortic Valve Calcium score: 2336 Aortic annular dimension: Phase assessed: 20% Annular area: 462 mm2 Annular perimeter: 77.3 mm Max diameter: 26.1 mm Min diameter: 22.9 mm Annular and subannular calcification: None. Optimal coplanar projection: LAO 1 CRA 3 Coronary Artery Height above Annulus: Left Main: 15.9 mm Right Coronary: 16.3 mm Sinus of Valsalva Measurements: Non-coronary: 34 mm Right-coronary: 33 mm Left-coronary: 34 mm Sinus of Valsalva Height: Non-coronary: 26.0 mm Right-coronary: 23.2 mm Left-coronary: 22.5 mm Sinotubular Junction: 27 mm with mild to moderate calcifications. Ascending Thoracic Aorta: 31 mm with diffuse mixed density atherosclerosis noted.  Coronary Arteries: CAC score of 802, which is 80th percentile for age- and sex-matched controls. Normal coronary origin. Right dominance. The study was performed without use of NTG and is insufficient for plaque evaluation. The following assessment was made nonetheless: Left main: The left main is a large caliber vessel with a normal take off from the left coronary cusp that trifurcates into a LAD, LCX, and ramus intermedius. There is minimal mixed density plaque (<25%). Left anterior descending artery: The proximal LAD contains mild mixed density plaque (25-49%). The mid LAD contains minimal calcified plaque (<25%). The distal LAD contains minimal non-calcified plaque (<25%). Ramus intermedius: Large vessel with minimal calcified plaque (<25%). Left circumflex artery: The LCX is non-dominant with minimal non-calcified plaque (<25%). Right coronary artery: Dominant with normal take off from the [place]. The proximal and mid RCA segments contain minimal calcified plaque (<25%). The distal RCA contains minimal non-calcified plaque. The RCA terminates as a patent PDA and PLV branch. Cardiac Morphology:  Right Atrium: Right atrial size is within normal limits. Right Ventricle: The right ventricular cavity is within normal limits. Left Atrium: Left atrial size is normal in size with no left atrial appendage filling defect. Left Ventricle: The ventricular cavity size is within normal limits. There are no stigmata of prior infarction. There is no abnormal filling defect. Normal left ventricular function, LVEF=72%. No regional wall motion abnormalities. Pulmonary arteries: Normal in size without proximal filling defect. Pulmonary veins: Normal pulmonary venous drainage. Pericardium: Normal thickness with no significant effusion or calcium present. Mitral Valve: The mitral valve is normal structure with mild mitral annular calcification. Extra-cardiac findings: See attached radiology report for non-cardiac structures. IMPRESSION: 1. 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 2. Annular measurements appropriate for 26 mm Edwards Sapien 3 TAVR 3. No significant annular or subannular calcifications. 4. Sufficient coronary to annulus distance. 5. Optimal Fluoroscopic Angle for Delivery: LAO 1 CRA 3 6. Coronary calcium score of 802 which is 80th percentile for age- and sex-matched controls. 7. 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 [place] T. Audie Box, MD Electronically Signed: By: Eleonore Chiquito On: 12/01/2019 22:06   CT ANGIO CHEST AORTA W/CM & OR WO/CM  Result Date: 12/02/2019 CLINICAL DATA:  Inpatient. Severe aortic stenosis. Pre-TAVR planning. EXAM: CT ANGIOGRAPHY CHEST, ABDOMEN AND PELVIS TECHNIQUE: Multidetector CT imaging through the chest, abdomen and pelvis was performed using the standard protocol during bolus administration of intravenous contrast. Multiplanar reconstructed images and MIPs were obtained and reviewed to evaluate the vascular anatomy. CONTRAST:   58mL OMNIPAQUE IOHEXOL 350 MG/ML SOLN COMPARISON:  11/26/2019 CT angiogram of the chest, abdomen and pelvis. FINDINGS: CTA CHEST FINDINGS Cardiovascular: Normal heart size. No significant pericardial effusion/thickening. Diffuse thickening and coarse calcification of the aortic valve. Three-vessel coronary atherosclerosis. Atherosclerotic nonaneurysmal thoracic aorta. Normal caliber pulmonary arteries. No central pulmonary emboli. Mediastinum/Nodes: Scattered subcentimeter hypodense bilateral thyroid nodules. Not clinically significant; no follow-up imaging recommended (ref: J Am Coll Radiol. 2015 Feb;12(2): 143-50). Unremarkable esophagus. No pathologically enlarged axillary, mediastinal or hilar lymph nodes. Lungs/Pleura: No pneumothorax. No pleural effusion. No acute consolidative airspace disease or lung masses. Anterior left upper lobe tiny 3 mm solid pulmonary nodule (series 5/image 46), stable since recent chest CT. No additional significant pulmonary nodules. Musculoskeletal: No aggressive appearing focal osseous lesions. Moderate thoracic spondylosis. Symmetric moderate bilateral gynecomastia, stable. CTA ABDOMEN AND PELVIS FINDINGS Hepatobiliary: Normal liver  with no liver mass. Stable distended gallbladder with layering subcentimeter calcified gallstones. No gallbladder wall thickening or pericholecystic fluid. No biliary ductal dilatation. Pancreas: Normal, with no mass or duct dilation. Spleen: Normal size. No mass. Adrenals/Urinary Tract: Left adrenal 2.6 cm nodule with density 80 HU and right adrenal 1.7 cm nodule with density 57 HU, both characterized as benign adenomas on recent noncontrast chest CT. No overt hydronephrosis. No contour deforming renal masses. Normal nondistended bladder. Stomach/Bowel: Normal non-distended stomach. Normal caliber small bowel with no small bowel wall thickening. Normal appendix. Normal large bowel with no diverticulosis, large bowel wall thickening or pericolonic fat  stranding. Vascular/Lymphatic: Atherosclerotic nonaneurysmal abdominal aorta. No pathologically enlarged lymph nodes in the abdomen or pelvis. Reproductive: Mild prostatomegaly. Brachytherapy seeds scattered throughout the prostate. Other: No pneumoperitoneum, ascites or focal fluid collection. Small fat containing left inguinal hernia. Musculoskeletal: No aggressive appearing focal osseous lesions. Mild lumbar spondylosis. VASCULAR MEASUREMENTS PERTINENT TO TAVR: AORTA: Minimal Aortic Diameter-14.8 x 13.9 mm Severity of Aortic Calcification-moderate to severe RIGHT PELVIS: Right Common Iliac Artery - Minimal Diameter-10.2 x 6.0 mm Tortuosity-mild Calcification-severe Right External Iliac Artery - Minimal Diameter-5.5 x 4.6 mm Tortuosity-mild-to-moderate Calcification-severe Right Common Femoral Artery - Minimal Diameter-7.3 x 6.7 mm Tortuosity-mild Calcification-moderate LEFT PELVIS: Left Common Iliac Artery - Minimal Diameter-9.3 x 8.2 mm Tortuosity-mild Calcification-severe Left External Iliac Artery - Minimal Diameter-7.1 x 5.1 mm Tortuosity-mild mild Calcification-severe Left Common Femoral Artery - Minimal Diameter-8.3 x 7.0 mm Tortuosity-mild Calcification-moderate Review of the MIP images confirms the above findings. IMPRESSION: 1. Vascular findings and measurements pertinent to potential TAVR procedure, as detailed. 2. Diffuse thickening and coarse calcification of the aortic valve, compatible with the reported history of severe aortic stenosis. 3. Three-vessel coronary atherosclerosis. 4. Tiny 3 mm solid left upper lobe pulmonary nodule. No follow-up needed if patient is low-risk. Non-contrast chest CT can be considered in 12 months if patient is high-risk. This recommendation follows the consensus statement: Guidelines for Management of Incidental Pulmonary Nodules Detected on CT Images: From the Fleischner Society 2017; Radiology 2017; 284:228-243. 5. Cholelithiasis. 6. Bilateral adrenal adenomas. 7.  Mild prostatomegaly. 8. Small fat containing left inguinal hernia. 9. Aortic Atherosclerosis (ICD10-I70.0). Electronically Signed   By: Ilona Sorrel M.D.   On: 12/02/2019 13:55   VAS US CAROTID  Result Date: 12/02/2019 Carotid Arterial Duplex Study Indications:       Pre-TAVR. Risk Factors:      Hypertension, hyperlipidemia. Comparison Study:  No prior studies. Performing Technologist: Shepard Hum RVT  Examination Guidelines: A complete evaluation includes B-mode imaging, spectral Doppler, color Doppler, and power Doppler as needed of all accessible portions of each vessel. Bilateral testing is considered an integral part of a complete examination. Limited examinations for reoccurring indications may be performed as noted.  Right Carotid Findings: +----------+--------+--------+--------+-----------------------+--------+             PSV cm/s EDV cm/s Stenosis Plaque Description      Comments  +----------+--------+--------+--------+-----------------------+--------+  CCA Prox   170      14                smooth and heterogenous           +----------+--------+--------+--------+-----------------------+--------+  CCA Distal 97       12                smooth and heterogenous           +----------+--------+--------+--------+-----------------------+--------+  ICA Prox   37       7  smooth and heterogenous           +----------+--------+--------+--------+-----------------------+--------+  ICA Distal 50       12                                        tortuous  +----------+--------+--------+--------+-----------------------+--------+  ECA        77       7                                                   +----------+--------+--------+--------+-----------------------+--------+ +----------+--------+-------+--------+-------------------+             PSV cm/s EDV cms Describe Arm Pressure (mmHG)  +----------+--------+-------+--------+-------------------+  Subclavian 178                                             +----------+--------+-------+--------+-------------------+ +---------+--------+--+--------+-+---------+  Vertebral PSV cm/s 38 EDV cm/s 8 Antegrade  +---------+--------+--+--------+-+---------+  Left Carotid Findings: +----------+--------+--------+--------+-----------------------+--------+             PSV cm/s EDV cm/s Stenosis Plaque Description      Comments  +----------+--------+--------+--------+-----------------------+--------+  CCA Prox   161      22                smooth and heterogenous           +----------+--------+--------+--------+-----------------------+--------+  CCA Distal 77       15                smooth and heterogenous           +----------+--------+--------+--------+-----------------------+--------+  ICA Prox   61       10                calcific                          +----------+--------+--------+--------+-----------------------+--------+  ICA Distal 41       10                                        tortuous  +----------+--------+--------+--------+-----------------------+--------+  ECA        69       9                                                   +----------+--------+--------+--------+-----------------------+--------+ +----------+--------+--------+--------+-------------------+             PSV cm/s EDV cm/s Describe Arm Pressure (mmHG)  +----------+--------+--------+--------+-------------------+  Subclavian 229                                             +----------+--------+--------+--------+-------------------+ +---------+--------+--+--------+--+---------+  Vertebral PSV cm/s 54 EDV cm/s 11 Antegrade  +---------+--------+--+--------+--+---------+   Summary: Right Carotid: Velocities in the right ICA  are consistent with a 1-39% stenosis. Left Carotid: Velocities in the left ICA are consistent with a 1-39% stenosis. Vertebrals: Bilateral vertebral arteries demonstrate antegrade flow. *See table(s) above for measurements and observations.     Preliminary    CT Angio Abd/Pel w/ and/or  w/o  Result Date: 12/02/2019 CLINICAL DATA:  Inpatient. Severe aortic stenosis. Pre-TAVR planning. EXAM: CT ANGIOGRAPHY CHEST, ABDOMEN AND PELVIS TECHNIQUE: Multidetector CT imaging through the chest, abdomen and pelvis was performed using the standard protocol during bolus administration of intravenous contrast. Multiplanar reconstructed images and MIPs were obtained and reviewed to evaluate the vascular anatomy. CONTRAST:  40mL OMNIPAQUE IOHEXOL 350 MG/ML SOLN COMPARISON:  11/26/2019 CT angiogram of the chest, abdomen and pelvis. FINDINGS: CTA CHEST FINDINGS Cardiovascular: Normal heart size. No significant pericardial effusion/thickening. Diffuse thickening and coarse calcification of the aortic valve. Three-vessel coronary atherosclerosis. Atherosclerotic nonaneurysmal thoracic aorta. Normal caliber pulmonary arteries. No central pulmonary emboli. Mediastinum/Nodes: Scattered subcentimeter hypodense bilateral thyroid nodules. Not clinically significant; no follow-up imaging recommended (ref: J Am Coll Radiol. 2015 Feb;12(2): 143-50). Unremarkable esophagus. No pathologically enlarged axillary, mediastinal or hilar lymph nodes. Lungs/Pleura: No pneumothorax. No pleural effusion. No acute consolidative airspace disease or lung masses. Anterior left upper lobe tiny 3 mm solid pulmonary nodule (series 5/image 46), stable since recent chest CT. No additional significant pulmonary nodules. Musculoskeletal: No aggressive appearing focal osseous lesions. Moderate thoracic spondylosis. Symmetric moderate bilateral gynecomastia, stable. CTA ABDOMEN AND PELVIS FINDINGS Hepatobiliary: Normal liver with no liver mass. Stable distended gallbladder with layering subcentimeter calcified gallstones. No gallbladder wall thickening or pericholecystic fluid. No biliary ductal dilatation. Pancreas: Normal, with no mass or duct dilation. Spleen: Normal size. No mass. Adrenals/Urinary Tract: Left adrenal 2.6 cm nodule with density  80 HU and right adrenal 1.7 cm nodule with density 57 HU, both characterized as benign adenomas on recent noncontrast chest CT. No overt hydronephrosis. No contour deforming renal masses. Normal nondistended bladder. Stomach/Bowel: Normal non-distended stomach. Normal caliber small bowel with no small bowel wall thickening. Normal appendix. Normal large bowel with no diverticulosis, large bowel wall thickening or pericolonic fat stranding. Vascular/Lymphatic: Atherosclerotic nonaneurysmal abdominal aorta. No pathologically enlarged lymph nodes in the abdomen or pelvis. Reproductive: Mild prostatomegaly. Brachytherapy seeds scattered throughout the prostate. Other: No pneumoperitoneum, ascites or focal fluid collection. Small fat containing left inguinal hernia. Musculoskeletal: No aggressive appearing focal osseous lesions. Mild lumbar spondylosis. VASCULAR MEASUREMENTS PERTINENT TO TAVR: AORTA: Minimal Aortic Diameter-14.8 x 13.9 mm Severity of Aortic Calcification-moderate to severe RIGHT PELVIS: Right Common Iliac Artery - Minimal Diameter-10.2 x 6.0 mm Tortuosity-mild Calcification-severe Right External Iliac Artery - Minimal Diameter-5.5 x 4.6 mm Tortuosity-mild-to-moderate Calcification-severe Right Common Femoral Artery - Minimal Diameter-7.3 x 6.7 mm Tortuosity-mild Calcification-moderate LEFT PELVIS: Left Common Iliac Artery - Minimal Diameter-9.3 x 8.2 mm Tortuosity-mild Calcification-severe Left External Iliac Artery - Minimal Diameter-7.1 x 5.1 mm Tortuosity-mild mild Calcification-severe Left Common Femoral Artery - Minimal Diameter-8.3 x 7.0 mm Tortuosity-mild Calcification-moderate Review of the MIP images confirms the above findings. IMPRESSION: 1. Vascular findings and measurements pertinent to potential TAVR procedure, as detailed. 2. Diffuse thickening and coarse calcification of the aortic valve, compatible with the reported history of severe aortic stenosis. 3. Three-vessel coronary  atherosclerosis. 4. Tiny 3 mm solid left upper lobe pulmonary nodule. No follow-up needed if patient is low-risk. Non-contrast chest CT can be considered in 12 months if patient is high-risk. This recommendation follows the consensus statement: Guidelines for Management of Incidental Pulmonary Nodules Detected on CT Images: From the Fleischner  Society 2017; Radiology 2017; 284:228-243. 5. Cholelithiasis. 6. Bilateral adrenal adenomas. 7. Mild prostatomegaly. 8. Small fat containing left inguinal hernia. 9. Aortic Atherosclerosis (ICD10-I70.0). Electronically Signed   By: Ilona Sorrel M.D.   On: 12/02/2019 13:55    Cardiac Studies  Echo EF 60-65% with moderate severe AS Cardiac CTA aortic valve calcium score 2300.  Favorable anatomy for TAVR.  Patient Profile  Elijah White is a 79 y.o. male with ESRD on hemodialysis, hypertension, HFpEF who was admitted on 11/26/2019 with chest pain and shortness of breath.  Found to have severe aortic stenosis.  Assessment & Plan   1.  Symptomatic severe aortic stenosis -Echo with moderate to severe AS.  Aortic valve calcium score in severe range.  Appears to have favorable anatomy for TAVR. -Underwent left right heart cath today.  Nonobstructive CAD. -TAVR work-up is complete.  He can follow-up with the [facility] as an outpatient. -He is okay for discharge.  This may occur tomorrow.  2.  Nonobstructive CAD -Continue aspirin and statin. -If he is not discharged today I will follow-up his cath site check tomorrow.  [facility] will sign off.   Medication Recommendations: Aspirin, statin Other recommendations (labs, testing, etc): None Follow up as an outpatient: He will follow up with the structural heart disease clinic at discharge.  I will then arrange for him to see me in 2 to 3 months post discharge.  For questions or updates, please contact [place] Please consult [email or web address] for contact info under   Time Spent with  Patient: I have spent a total of 15 minutes with patient reviewing hospital notes, telemetry, EKGs, labs and examining the patient as well as establishing an assessment and plan that was discussed with the patient.  > 50% of time was spent in direct patient care.    Signed, Addison Naegeli. Audie Box, [place]  12/03/2019 11:07 AM

## 2019-12-03 NOTE — Progress Notes (Signed)
[facility] Progress Note   Dialysis Orders: TTS [place] 4 hr EDW 76 2 K 2 Ca 400/800 left upper AVF  no heparin no Fe no ESA hectorol 7 Recent labs: hgb 12 iPTH 300s Ca/P controlled - 2 [place] ac; very compliant with treatments   Assessment/Plan: 1. 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  Seen by cardiology. For cath 9/30 2. Abd pain - PPI started, ^total bil noted, US showed GB distension with stone wedged at GB neck no acute cholecystitis. Per pmd.  3. Colon mass - s/p colonoscopy per GI.  Multiple polyps were removed and path sent 4. 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 Pt started HD Feb 2021, but no recent imaging in Epic.  Replaced foley and renal US repeat with resolution of hydronephrosis/improvement in bladder outlet obstruction. Started flomax 9/27.  Foley catheter was removed on 9/28 and he is urinating without difficulty per pt 5. ESRD -  TTS HD schedule 6. Hypertension/volume  - controlled; new EDW at d/c 7. Anemia  -no ESA/ IV Fe ; note colon mass with colonoscopy and path sent 8. 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  Continue auryxia - increased to home dose for hyperphos 10. Adrenal mass. Note urine metanephrines sent per primary team.  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   Disposition per primary team     Subjective:    Last HD on 9/28 with 0.2 kg UF.  Feels ok.  He states he has been urinating without difficulty after foley removal.  He is about to leave for a heart cath.  Hasn't had labs drawn yet.  No dizziness or cramping after or during last HD tx.  Review of systems:    Denies shortness of breath or chest pain No n/v   Objective Vitals:   12/02/19 1651 12/02/19 1953 12/03/19 0100 12/03/19 0508  BP: 116/64 132/78  119/63  Pulse: 79 78  67  Resp: 20 18  18    Temp: 98.2 F (36.8 C) 98.3 F (36.8 C)  98 F (36.7 C)  TempSrc: Oral Oral  Oral  SpO2: 99% 97%  100%  Weight:   75.5 kg   Height:       Physical Exam   General: adult male in bed in NAD   Heart: S1S2 no rub Lungs: clear and unlabored on room air  Abdomen: soft NT Extremities: no LE edema  Neuro - alert and oriented x3 provides hx and follows commands; blind  Psych normal mood and affect Dialysis Access: left upper AVF +bruit and thrill   Additional Objective Labs: Basic Metabolic Panel: Recent Labs  Lab 11/30/19 0434 12/01/19 0512 12/02/19 1215  NA 137 137 135  K 4.0 4.1 3.6  CL 96* 97* 96*  CO2 23 24 26   GLUCOSE 119* 119* 134*  BUN 31* 40* 20  CREATININE 7.64* 9.18* 6.44*  CALCIUM 10.3 9.6 9.8  PHOS  --  6.5*  --    Liver Function Tests: Recent Labs  Lab 11/26/19 1112 11/27/19 1038 12/01/19 0512  AST 19 41  --   ALT 15 14  --   ALKPHOS 99 72  --   BILITOT 0.5 1.3*  --   PROT 7.0 6.0*  --   ALBUMIN 3.7 3.3* 3.1*   Recent Labs  Lab 11/26/19 1112  LIPASE 29  CBC: Recent Labs  Lab 11/26/19 1112 11/26/19 1816 11/27/19 1038 11/27/19 1038 11/28/19 0248 11/28/19 0248 11/30/19 0434 12/01/19 0858 12/02/19 1215  WBC 7.6   < > 7.0   < > 6.3   < > 5.6 6.2 4.7  NEUTROABS 4.0  --   --   --   --   --   --   --   --   HGB 11.7*   < > 12.1*   < > 11.3*   < > 13.5 10.9* 11.6*  HCT 36.8*   < > 36.6*   < > 35.0*   < > 40.8 33.8* 35.7*  MCV 104.8*   < > 103.1*  --  101.4*  --  102.5* 102.4* 100.8*  PLT 194   < > 214   < > 208   < > 195 228 212   < > = values in this interval not displayed.   Blood Culture    Component Value Date/Time   SDES URINE, RANDOM 06/05/2007 0039   SPECREQUEST NONE 06/05/2007 0039   CULT NO GROWTH 06/05/2007 0039   REPTSTATUS 06/06/2007 FINAL 06/05/2007 0039    Cardiac Enzymes: No results for input(s): CKTOTAL, CKMB, CKMBINDEX, TROPONINI in the last 168 hours. CBG: Recent Labs  Lab 12/02/19 0621 12/02/19 1305  12/02/19 1631 12/02/19 2107 12/03/19 0622  GLUCAP 112* 128* 126* 175* 120*   Iron Studies: No results for input(s): IRON, TIBC, TRANSFERRIN, FERRITIN in the last 72 hours. Lab Results  Component Value Date   INR 1.0 11/26/2019   INR 1.11 03/25/2018   Studies/Results: DG Orthopantogram  Result Date: 12/02/2019 CLINICAL DATA:  Poor dentition. EXAM: ORTHOPANTOGRAM/PANORAMIC COMPARISON:  None. FINDINGS: No fracture or dislocation is noted. Extremely poor dentition is noted with only a few teeth left in the mandible. No definite lytic destruction is noted. IMPRESSION: Extremely poor dentition. No definite lytic destruction is noted. Electronically Signed   By: Marijo Conception M.D.   On: 12/02/2019 11:31   CT CORONARY MORPH W/CTA COR W/SCORE W/CA W/CM &/OR WO/CM  Addendum Date: 12/02/2019   ADDENDUM REPORT: 12/02/2019 13:26 EXAM: OVER-READ INTERPRETATION  CT CHEST The following report is an over-read performed by radiologist Dr. Samara Snide [facility], [place] on 12/02/2019. This over-read does not include interpretation of cardiac or coronary anatomy or pathology. The coronary CTA interpretation by the cardiologist is attached. COMPARISON:  11/26/2019 chest CT angiogram. FINDINGS: Please see the separate concurrent chest CT angiogram report for details. IMPRESSION: Please see the separate concurrent chest CT angiogram report for details. Electronically Signed   By: Ilona Sorrel M.D.   On: 12/02/2019 13:26   Result Date: 12/02/2019 CLINICAL DATA:  Severe Aortic Stenosis. EXAM: Cardiac TAVR CT TECHNIQUE: The patient was scanned on a [employer]. A 120 kV retrospective scan was triggered in the descending thoracic aorta at 111 HU's. Gantry rotation speed was 250 msecs and collimation was .6 mm. No beta blockade or nitro were given. The 3D data set was reconstructed in 5% intervals of the R-R cycle. Systolic and diastolic phases were analyzed on a dedicated work station using MPR, MIP and  VRT modes. The patient received 80 cc of contrast. FINDINGS: Image quality: Excellent. Noise artifact is: Limited. Valve Morphology: The aortic valve is tricuspid. The leaflets are severely calcified and severely thickened. Leaflet calcification appears diffuse. The leaflets exhibit severely restricted movement in systole Aortic Valve Calcium score: 2336 Aortic annular dimension: Phase assessed: 20% Annular area: 462 mm2 Annular perimeter:  77.3 mm Max diameter: 26.1 mm Min diameter: 22.9 mm Annular and subannular calcification: None. Optimal coplanar projection: LAO 1 CRA 3 Coronary Artery Height above Annulus: Left Main: 15.9 mm Right Coronary: 16.3 mm Sinus of Valsalva Measurements: Non-coronary: 34 mm Right-coronary: 33 mm Left-coronary: 34 mm Sinus of Valsalva Height: Non-coronary: 26.0 mm Right-coronary: 23.2 mm Left-coronary: 22.5 mm Sinotubular Junction: 27 mm with mild to moderate calcifications. Ascending Thoracic Aorta: 31 mm with diffuse mixed density atherosclerosis noted. Coronary Arteries: CAC score of 802, which is 80th percentile for age- and sex-matched controls. Normal coronary origin. Right dominance. The study was performed without use of NTG and is insufficient for plaque evaluation. The following assessment was made nonetheless: Left main: The left main is a large caliber vessel with a normal take off from the left coronary cusp that trifurcates into a LAD, LCX, and ramus intermedius. There is minimal mixed density plaque (<25%). Left anterior descending artery: The proximal LAD contains mild mixed density plaque (25-49%). The mid LAD contains minimal calcified plaque (<25%). The distal LAD contains minimal non-calcified plaque (<25%). Ramus intermedius: Large vessel with minimal calcified plaque (<25%). Left circumflex artery: The LCX is non-dominant with minimal non-calcified plaque (<25%). Right coronary artery: Dominant with normal take off from the [place]. The proximal and mid RCA segments  contain minimal calcified plaque (<25%). The distal RCA contains minimal non-calcified plaque. The RCA terminates as a patent PDA and PLV branch. Cardiac Morphology: Right Atrium: Right atrial size is within normal limits. Right Ventricle: The right ventricular cavity is within normal limits. Left Atrium: Left atrial size is normal in size with no left atrial appendage filling defect. Left Ventricle: The ventricular cavity size is within normal limits. There are no stigmata of prior infarction. There is no abnormal filling defect. Normal left ventricular function, LVEF=72%. No regional wall motion abnormalities. Pulmonary arteries: Normal in size without proximal filling defect. Pulmonary veins: Normal pulmonary venous drainage. Pericardium: Normal thickness with no significant effusion or calcium present. Mitral Valve: The mitral valve is normal structure with mild mitral annular calcification. Extra-cardiac findings: See attached radiology report for non-cardiac structures. IMPRESSION: 1. 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 2. Annular measurements appropriate for 26 mm Edwards Sapien 3 TAVR 3. No significant annular or subannular calcifications. 4. Sufficient coronary to annulus distance. 5. Optimal Fluoroscopic Angle for Delivery: LAO 1 CRA 3 6. Coronary calcium score of 802 which is 80th percentile for age- and sex-matched controls. 7. 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 [place] T. Audie Box, MD Electronically Signed: By: Eleonore Chiquito On: 12/01/2019 22:06   CT ANGIO CHEST AORTA W/CM & OR WO/CM  Result Date: 12/02/2019 CLINICAL DATA:  Inpatient. Severe aortic stenosis. Pre-TAVR planning. EXAM: CT ANGIOGRAPHY CHEST, ABDOMEN AND PELVIS TECHNIQUE: Multidetector CT imaging through the chest, abdomen and pelvis was performed using the standard protocol  during bolus administration of intravenous contrast. Multiplanar reconstructed images and MIPs were obtained and reviewed to evaluate the vascular anatomy. CONTRAST:  53mL OMNIPAQUE IOHEXOL 350 MG/ML SOLN COMPARISON:  11/26/2019 CT angiogram of the chest, abdomen and pelvis. FINDINGS: CTA CHEST FINDINGS Cardiovascular: Normal heart size. No significant pericardial effusion/thickening. Diffuse thickening and coarse calcification of the aortic valve. Three-vessel coronary atherosclerosis. Atherosclerotic nonaneurysmal thoracic aorta. Normal caliber pulmonary arteries. No central pulmonary emboli. Mediastinum/Nodes: Scattered subcentimeter hypodense bilateral thyroid nodules. Not clinically significant; no follow-up  imaging recommended (ref: J Am Coll Radiol. 2015 Feb;12(2): 143-50). Unremarkable esophagus. No pathologically enlarged axillary, mediastinal or hilar lymph nodes. Lungs/Pleura: No pneumothorax. No pleural effusion. No acute consolidative airspace disease or lung masses. Anterior left upper lobe tiny 3 mm solid pulmonary nodule (series 5/image 46), stable since recent chest CT. No additional significant pulmonary nodules. Musculoskeletal: No aggressive appearing focal osseous lesions. Moderate thoracic spondylosis. Symmetric moderate bilateral gynecomastia, stable. CTA ABDOMEN AND PELVIS FINDINGS Hepatobiliary: Normal liver with no liver mass. Stable distended gallbladder with layering subcentimeter calcified gallstones. No gallbladder wall thickening or pericholecystic fluid. No biliary ductal dilatation. Pancreas: Normal, with no mass or duct dilation. Spleen: Normal size. No mass. Adrenals/Urinary Tract: Left adrenal 2.6 cm nodule with density 80 HU and right adrenal 1.7 cm nodule with density 57 HU, both characterized as benign adenomas on recent noncontrast chest CT. No overt hydronephrosis. No contour deforming renal masses. Normal nondistended bladder. Stomach/Bowel: Normal non-distended stomach.  Normal caliber small bowel with no small bowel wall thickening. Normal appendix. Normal large bowel with no diverticulosis, large bowel wall thickening or pericolonic fat stranding. Vascular/Lymphatic: Atherosclerotic nonaneurysmal abdominal aorta. No pathologically enlarged lymph nodes in the abdomen or pelvis. Reproductive: Mild prostatomegaly. Brachytherapy seeds scattered throughout the prostate. Other: No pneumoperitoneum, ascites or focal fluid collection. Small fat containing left inguinal hernia. Musculoskeletal: No aggressive appearing focal osseous lesions. Mild lumbar spondylosis. VASCULAR MEASUREMENTS PERTINENT TO TAVR: AORTA: Minimal Aortic Diameter-14.8 x 13.9 mm Severity of Aortic Calcification-moderate to severe RIGHT PELVIS: Right Common Iliac Artery - Minimal Diameter-10.2 x 6.0 mm Tortuosity-mild Calcification-severe Right External Iliac Artery - Minimal Diameter-5.5 x 4.6 mm Tortuosity-mild-to-moderate Calcification-severe Right Common Femoral Artery - Minimal Diameter-7.3 x 6.7 mm Tortuosity-mild Calcification-moderate LEFT PELVIS: Left Common Iliac Artery - Minimal Diameter-9.3 x 8.2 mm Tortuosity-mild Calcification-severe Left External Iliac Artery - Minimal Diameter-7.1 x 5.1 mm Tortuosity-mild mild Calcification-severe Left Common Femoral Artery - Minimal Diameter-8.3 x 7.0 mm Tortuosity-mild Calcification-moderate Review of the MIP images confirms the above findings. IMPRESSION: 1. Vascular findings and measurements pertinent to potential TAVR procedure, as detailed. 2. Diffuse thickening and coarse calcification of the aortic valve, compatible with the reported history of severe aortic stenosis. 3. Three-vessel coronary atherosclerosis. 4. Tiny 3 mm solid left upper lobe pulmonary nodule. No follow-up needed if patient is low-risk. Non-contrast chest CT can be considered in 12 months if patient is high-risk. This recommendation follows the consensus statement: Guidelines for Management of  Incidental Pulmonary Nodules Detected on CT Images: From the Fleischner Society 2017; Radiology 2017; 284:228-243. 5. Cholelithiasis. 6. Bilateral adrenal adenomas. 7. Mild prostatomegaly. 8. Small fat containing left inguinal hernia. 9. Aortic Atherosclerosis (ICD10-I70.0). Electronically Signed   By: Ilona Sorrel M.D.   On: 12/02/2019 13:55   VAS US CAROTID  Result Date: 12/02/2019 Carotid Arterial Duplex Study Indications:       Pre-TAVR. Risk Factors:      Hypertension, hyperlipidemia. Comparison Study:  No prior studies. Performing Technologist: Haydel Hum RVT  Examination Guidelines: A complete evaluation includes B-mode imaging, spectral Doppler, color Doppler, and power Doppler as needed of all accessible portions of each vessel. Bilateral testing is considered an integral part of a complete examination. Limited examinations for reoccurring indications may be performed as noted.  Right Carotid Findings: +----------+--------+--------+--------+-----------------------+--------+           PSV cm/sEDV cm/sStenosisPlaque Description     Comments +----------+--------+--------+--------+-----------------------+--------+ CCA Prox  170     14  smooth and heterogenous         +----------+--------+--------+--------+-----------------------+--------+ CCA Distal97      12              smooth and heterogenous         +----------+--------+--------+--------+-----------------------+--------+ ICA Prox  37      7               smooth and heterogenous         +----------+--------+--------+--------+-----------------------+--------+ ICA Distal50      12                                     tortuous +----------+--------+--------+--------+-----------------------+--------+ ECA       77      7                                               +----------+--------+--------+--------+-----------------------+--------+ +----------+--------+-------+--------+-------------------+            PSV cm/sEDV cmsDescribeArm Pressure (mmHG) +----------+--------+-------+--------+-------------------+ [id number]                                        +----------+--------+-------+--------+-------------------+ +---------+--------+--+--------+-+---------+ VertebralPSV cm/s38EDV cm/s8Antegrade +---------+--------+--+--------+-+---------+  Left Carotid Findings: +----------+--------+--------+--------+-----------------------+--------+           PSV cm/sEDV cm/sStenosisPlaque Description     Comments +----------+--------+--------+--------+-----------------------+--------+ CCA Prox  161     22              smooth and heterogenous         +----------+--------+--------+--------+-----------------------+--------+ CCA Distal77      15              smooth and heterogenous         +----------+--------+--------+--------+-----------------------+--------+ ICA Prox  61      10              calcific                        +----------+--------+--------+--------+-----------------------+--------+ ICA Distal41      10                                     tortuous +----------+--------+--------+--------+-----------------------+--------+ ECA       69      9                                               +----------+--------+--------+--------+-----------------------+--------+ +----------+--------+--------+--------+-------------------+           PSV cm/sEDV cm/sDescribeArm Pressure (mmHG) +----------+--------+--------+--------+-------------------+ Subclavian229                                         +----------+--------+--------+--------+-------------------+ +---------+--------+--+--------+--+---------+ VertebralPSV cm/s54EDV cm/s11Antegrade +---------+--------+--+--------+--+---------+   Summary: Right Carotid: Velocities in the right ICA are consistent with a 1-39% stenosis. Left Carotid: Velocities in the left ICA are consistent with a 1-39% stenosis.  Vertebrals: Bilateral vertebral arteries demonstrate antegrade flow. *  See table(s) above for measurements and observations.     Preliminary    CT Angio Abd/Pel w/ and/or w/o  Result Date: 12/02/2019 CLINICAL DATA:  Inpatient. Severe aortic stenosis. Pre-TAVR planning. EXAM: CT ANGIOGRAPHY CHEST, ABDOMEN AND PELVIS TECHNIQUE: Multidetector CT imaging through the chest, abdomen and pelvis was performed using the standard protocol during bolus administration of intravenous contrast. Multiplanar reconstructed images and MIPs were obtained and reviewed to evaluate the vascular anatomy. CONTRAST:  48mL OMNIPAQUE IOHEXOL 350 MG/ML SOLN COMPARISON:  11/26/2019 CT angiogram of the chest, abdomen and pelvis. FINDINGS: CTA CHEST FINDINGS Cardiovascular: Normal heart size. No significant pericardial effusion/thickening. Diffuse thickening and coarse calcification of the aortic valve. Three-vessel coronary atherosclerosis. Atherosclerotic nonaneurysmal thoracic aorta. Normal caliber pulmonary arteries. No central pulmonary emboli. Mediastinum/Nodes: Scattered subcentimeter hypodense bilateral thyroid nodules. Not clinically significant; no follow-up imaging recommended (ref: J Am Coll Radiol. 2015 Feb;12(2): 143-50). Unremarkable esophagus. No pathologically enlarged axillary, mediastinal or hilar lymph nodes. Lungs/Pleura: No pneumothorax. No pleural effusion. No acute consolidative airspace disease or lung masses. Anterior left upper lobe tiny 3 mm solid pulmonary nodule (series 5/image 46), stable since recent chest CT. No additional significant pulmonary nodules. Musculoskeletal: No aggressive appearing focal osseous lesions. Moderate thoracic spondylosis. Symmetric moderate bilateral gynecomastia, stable. CTA ABDOMEN AND PELVIS FINDINGS Hepatobiliary: Normal liver with no liver mass. Stable distended gallbladder with layering subcentimeter calcified gallstones. No gallbladder wall thickening or pericholecystic fluid. No  biliary ductal dilatation. Pancreas: Normal, with no mass or duct dilation. Spleen: Normal size. No mass. Adrenals/Urinary Tract: Left adrenal 2.6 cm nodule with density 80 HU and right adrenal 1.7 cm nodule with density 57 HU, both characterized as benign adenomas on recent noncontrast chest CT. No overt hydronephrosis. No contour deforming renal masses. Normal nondistended bladder. Stomach/Bowel: Normal non-distended stomach. Normal caliber small bowel with no small bowel wall thickening. Normal appendix. Normal large bowel with no diverticulosis, large bowel wall thickening or pericolonic fat stranding. Vascular/Lymphatic: Atherosclerotic nonaneurysmal abdominal aorta. No pathologically enlarged lymph nodes in the abdomen or pelvis. Reproductive: Mild prostatomegaly. Brachytherapy seeds scattered throughout the prostate. Other: No pneumoperitoneum, ascites or focal fluid collection. Small fat containing left inguinal hernia. Musculoskeletal: No aggressive appearing focal osseous lesions. Mild lumbar spondylosis. VASCULAR MEASUREMENTS PERTINENT TO TAVR: AORTA: Minimal Aortic Diameter-14.8 x 13.9 mm Severity of Aortic Calcification-moderate to severe RIGHT PELVIS: Right Common Iliac Artery - Minimal Diameter-10.2 x 6.0 mm Tortuosity-mild Calcification-severe Right External Iliac Artery - Minimal Diameter-5.5 x 4.6 mm Tortuosity-mild-to-moderate Calcification-severe Right Common Femoral Artery - Minimal Diameter-7.3 x 6.7 mm Tortuosity-mild Calcification-moderate LEFT PELVIS: Left Common Iliac Artery - Minimal Diameter-9.3 x 8.2 mm Tortuosity-mild Calcification-severe Left External Iliac Artery - Minimal Diameter-7.1 x 5.1 mm Tortuosity-mild mild Calcification-severe Left Common Femoral Artery - Minimal Diameter-8.3 x 7.0 mm Tortuosity-mild Calcification-moderate Review of the MIP images confirms the above findings. IMPRESSION: 1. Vascular findings and measurements pertinent to potential TAVR procedure, as detailed.  2. Diffuse thickening and coarse calcification of the aortic valve, compatible with the reported history of severe aortic stenosis. 3. Three-vessel coronary atherosclerosis. 4. Tiny 3 mm solid left upper lobe pulmonary nodule. No follow-up needed if patient is low-risk. Non-contrast chest CT can be considered in 12 months if patient is high-risk. This recommendation follows the consensus statement: Guidelines for Management of Incidental Pulmonary Nodules Detected on CT Images: From the Fleischner Society 2017; Radiology 2017; 284:228-243. 5. Cholelithiasis. 6. Bilateral adrenal adenomas. 7. Mild prostatomegaly. 8. Small fat containing left inguinal hernia. 9. Aortic Atherosclerosis (ICD10-I70.0). Electronically Signed  By: Ilona Sorrel M.D.   On: 12/02/2019 13:55   Medications: . sodium chloride    . sodium chloride 10 mL/hr at 12/03/19 0624   . apraclonidine  1 drop Right Eye TID  . atorvastatin  10 mg Oral Daily  . carvedilol  3.125 mg Oral BID WC  . Chlorhexidine Gluconate Cloth  6 each Topical Q0600  . dorzolamide-timolol  1 drop Right Eye BID  . doxercalciferol  7 mcg Intravenous Q T,Th,Sa-HD  . ferric citrate  420 mg Oral TID WC  . gabapentin  100 mg Oral QHS  . heparin  5,000 Units Subcutaneous Q8H  . hydrALAZINE  25 mg Oral BID  . insulin aspart  0-5 Units Subcutaneous QHS  . insulin aspart  0-6 Units Subcutaneous TID WC  . isosorbide mononitrate  30 mg Oral Daily  . latanoprost  1 drop Right Eye QHS  . multivitamin  1 tablet Oral QHS  . pantoprazole  40 mg Oral Daily  . pilocarpine  1 drop Right Eye QID  . sodium chloride flush  3 mL Intravenous Q12H  . tamsulosin  0.4 mg Oral QHS    Claudia Desanctis, MD 12/03/2019  6:54 AM

## 2019-12-03 NOTE — TOC Transition Note (Signed)
Transition of Care [facility]) - CM/SW Discharge Note   Patient Details  Name: Elijah White MRN: [id number] Date of Birth: Dec 22, 1940  Transition [facility] Care [facility]) CM/SW Contact:  Zenon Mayo, RN Phone Number: 12/03/2019, 1:09 PM   Clinical Narrative:    Patient is for dc today, NCM offered choice for [facility], [facility], he is refusing [place] services, states his daughter comes to check on him. 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  NCM tried to offer again and he states he does not want [place] services.    Final next level of care: Home/Self Care Barriers to Discharge: No Barriers Identified   Patient Goals and CMS Choice   CMS Medicare.gov Compare Post Acute Care list provided to:: Patient Choice offered to / list presented to : Patient  Discharge Placement                       Discharge Plan and Services                  DME Agency: NA       HH Arranged: Refused HH          Social Determinants of Health (SDOH) Interventions     Readmission Risk Interventions No flowsheet data found.

## 2019-12-03 NOTE — Progress Notes (Signed)
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 Pt was Fully aware about his Procedure. He is alert 4X.   Lab draws was not obtained, notified Phlebotomist around 0530 that pt will have procedure at 7:30 AM however Phlebotomist stated she can't draw on time d/t   two phlebotomist on the floor only.

## 2019-12-03 NOTE — Discharge Summary (Signed)
Physician Discharge Summary  THEDFORD BUNTON LTJ:[id number] DOB: 07-03-1940 DOA: 11/26/2019  PCP: [name], MD  Admit date: 11/26/2019 Discharge date: 12/03/2019 Admitted From: home  Disposition:  home   Recommendations for Outpatient Follow-up:  1. He will f/u with TAVR team 2. Will allow PCP to address his A1c of 7.5 3. Patient declines SNF- he is blind and lives alone- daughter checks on him but really would like him to go to SNF  Home Health:  ordered  Discharge Condition:  stable   CODE STATUS:  DNR   Diet recommendation:  Renal diabetic heart healthy Consultations:  Nephrology  GI  Cardiology   Procedures/Studies: . Colonoscopy . 2 d ECHO . CTA coronaries . Right and left heart cath   Discharge Diagnoses:  Principal Problem:   Chest pain Active Problems:   Adrenal mass (HCC)   Colonic mass   Severe aortic stenosis   Type 2 diabetes mellitus with renal manifestations (HCC)   Essential hypertension   GERD (gastroesophageal reflux disease)   HLD (hyperlipidemia)   ESRD (end stage renal disease) (HCC) Macrocytic anemia   Brief Summary: [name] a 79 y.o.malewith medical history significant ofESRD on dialysis (TTS), hypertension, hyperlipidemia, diabetes mellitus, GERD, legally blind in left eye, chronic diastolic CHF presented to the emergency department for evaluation of chest pain/epigastric pain, shortness of breath and generalized weakness for 1 day. Patient's daughter was driving him to dialysis unit when he developed chest pain so he was brought into the hospital he also had shortness of breath. Patient had also lost 10 to 12 pounds in last 29-monthsince he has been started on dialysis. Patient daughter stated that he lives alone at home and he is independent on daily life activities. She is concerned as patient is legally blind from left eye and head recent fall with bruises on his arm and is concerned about safety at home.Upon arrival to  EQT:MAUQJFH'Lvital signs stable, afebrile, no leukocytosis, maintaining oxygen saturation on room air, initial troponin 36 trended up to 37, EKG: No acute changes, COVID-19 negative. CT head negative for acute findings, lipase: WNL. CTA chest: Negative for PE or aortic dissection.CT was concerning for 2.3 cm in size transverse colon mass, 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. Chest x-ray showed pulmonary vascular congestion without pulmonary edema, mild cardiomegaly. EDP consulted nephrology. Patient was then admitted to hospital for further work-up and treatment. I began to take care of him on 9/29.   Hospital Course:  Principal Problem:   Chest pain dyspnea on exertion secondary to severe aortic stenosis -Has nonobstructive coronary artery disease per cardiology-continue aspirin and statin -Calcium score is elevated on the cardiac CTA which is consistent with severe aortic stenosis -  left and right heart cath performed today show non obstructive CAD- discussed with patient -He will subsequently follow-up as outpatient for possible TAVR  Active Problems: Colon mass -2.3 cm mass seen on CT scan -Underwent colonoscopy on 11/30/2019 and 4 polyps were found and removed -Biopsy show no high grade dysplasia    Type 2 diabetes mellitus with renal manifestations  -  hemoglobin A1c is 7.5- looks like he is on diet control only- will allow PCP to address this  Bilateral hydronephrosis -Improved after Foley catheter placed-Flomax initiated and Foley catheter removed- voiding well    Essential hypertension and diastolic heart failure grade 1 -Continue hydralazine and Imdur    ESRD (end stage renal disease)  - continue dialysis per nephrology     Adrenal mass  -  2.7 cm in size-patient does not wish to proceed with a work-up  Colelithiasis -No evidence of cholecystitis  Legally blind  Macrocytic anemia - Folate and B12 levels are normal - ? Myelodysplasia- may need heme  referral    Discharge Exam: Vitals:   12/03/19 1202 12/03/19 1225  BP: 138/60 (!) 121/103  Pulse: 70 71  Resp: 18 18  Temp: 98 F (36.7 C) 98.8 F (37.1 C)  SpO2: 100% 100%   Vitals:   12/03/19 1125 12/03/19 1155 12/03/19 1202 12/03/19 1225  BP: (!) 128/57 138/60 138/60 (!) 121/103  Pulse: 70 77 70 71  Resp: _0 Temp:  98 F (36.7 C) 98 F (36.7 C) 98.8 F (37.1 C)  TempSrc:  Oral Oral Oral  SpO2: 100% 100% 100% 100%  Weight:      Height:        General: Pt is alert, awake, not in acute distress Cardiovascular: RRR, S1/S2 +, no rubs, no gallops Respiratory: CTA bilaterally, no wheezing, no rhonchi Abdominal: Soft, NT, ND, bowel sounds + Extremities: no edema, no cyanosis   Discharge Instructions  Discharge Instructions    Diet - low sodium heart healthy   Complete by: As directed    Increase activity slowly   Complete by: As directed      Allergies as of 12/03/2019   No Known Allergies     Medication List    TAKE these medications   acetaminophen 500 MG tablet Commonly known as: TYLENOL Take 2 tablets (1,000 mg total) by mouth every 6 (six) hours as needed for mild pain, moderate pain or headache.   apraclonidine 0.5 % ophthalmic solution Commonly known as: IOPIDINE Place 1 drop into the right eye 3 (three) times daily.   atorvastatin 10 MG tablet Commonly known as: LIPITOR TAKE 1 TABLET BY MOUTH EVERY DAY   Auryxia 1 GM 210 MG(Fe) tablet Generic drug: ferric citrate Take 210 mg by mouth 3 (three) times daily with meals.   BD Pen Needle Nano U/F 32G X 4 MM Misc Generic drug: Insulin Pen Needle USE SUBCUTANEOUS FOUR TIMES A DAY   blood glucose meter kit and supplies Kit Dispense based on patient and insurance preference. Use up to four times daily as directed. (FOR ICD-9 250.00, 250.01). For QAC - HS accuchecks.   carvedilol 3.125 MG tablet Commonly known as: COREG TAKE 1 TABLET(3.125 MG) BY MOUTH TWICE DAILY WITH A MEAL What  changed: See the new instructions.   Dialyvite 800 0.8 MG Tabs Take 1 tablet by mouth daily.   diclofenac Sodium 1 % Gel Commonly known as: VOLTAREN Apply 4 g topically 4 (four) times daily as needed. What changed: reasons to take this   dorzolamide-timolol 22.3-6.8 MG/ML ophthalmic solution Commonly known as: COSOPT Place 1 drop into the right eye 2 (two) times daily.   esomeprazole 40 MG capsule Commonly known as: NEXIUM TAKE 1 CAPSULE BY MOUTH EVERY DAY   gabapentin 100 MG capsule Commonly known as: NEURONTIN TAKE 1 CAPSULE(100 MG) BY MOUTH AT BEDTIME What changed: See the new instructions.   hydrALAZINE 25 MG tablet Commonly known as: APRESOLINE TAKE 1 TABLET(25 MG) BY MOUTH TWICE DAILY What changed: See the new instructions.   hydroxypropyl methylcellulose / hypromellose 2.5 % ophthalmic solution Commonly known as: ISOPTO TEARS / GONIOVISC Place 1 drop into the left eye as needed for dry eyes.   isosorbide mononitrate 30 MG 24 hr tablet Commonly known as: IMDUR TAKE 1 TABLET(30 MG) BY  MOUTH DAILY What changed: See the new instructions.   latanoprost 0.005 % ophthalmic solution Commonly known as: XALATAN Place 1 drop into the right eye at bedtime.   OneTouch Delica Plus [id number] Misc USE AS DIRECTED UP TO FOUR TIMES A DAY   OneTouch Verio test strip Generic drug: glucose blood USE AS DIRECTED TO TEST FOUR TIMES A DAY   pilocarpine 4 % ophthalmic solution Commonly known as: PILOCAR Place 1 drop into the right eye 4 (four) times daily.   tamsulosin 0.4 MG Caps capsule Commonly known as: FLOMAX Take 1 capsule (0.4 mg total) by mouth at bedtime.       No Known Allergies    DG Orthopantogram  Result Date: 12/02/2019 CLINICAL DATA:  Poor dentition. EXAM: ORTHOPANTOGRAM/PANORAMIC COMPARISON:  None. FINDINGS: No fracture or dislocation is noted. Extremely poor dentition is noted with only a few teeth left in the mandible. No definite lytic destruction is  noted. IMPRESSION: Extremely poor dentition. No definite lytic destruction is noted. Electronically Signed   By: Marijo Conception M.D.   On: 12/02/2019 11:31   DG Shoulder Right  Result Date: 11/20/2019 CLINICAL DATA:  Fall, right shoulder pain EXAM: RIGHT SHOULDER - 2+ VIEW COMPARISON:  None. FINDINGS: Three view radiograph right shoulder demonstrates normal alignment. No fracture or dislocation. Mild acromioclavicular and glenohumeral degenerative arthritis with asymmetric joint space narrowing and subtle osteophyte formation noted. Limited evaluation of the right apex is unremarkable. IMPRESSION: No acute fracture or dislocation. Electronically Signed   By: Fidela Salisbury MD   On: 11/20/2019 20:53   CT Head Wo Contrast  Result Date: 11/26/2019 CLINICAL DATA:  Mental status change EXAM: CT HEAD WITHOUT CONTRAST TECHNIQUE: Contiguous axial images were obtained from the base of the skull through the vertex without intravenous contrast. COMPARISON:  CT head 10/20/2017 FINDINGS: Brain: Cerebral ventricle sizes are concordant with the degree of cerebral volume loss. Patchy and confluent areas of decreased attenuation are noted throughout the deep and periventricular white matter of the cerebral hemispheres bilaterally, compatible with chronic microvascular ischemic disease. No evidence of large-territorial acute infarction. No parenchymal hemorrhage. No mass lesion. No extra-axial collection. No mass effect or midline shift. No hydrocephalus. Basilar cisterns are patent. Vascular: No hyperdense vessel or unexpected calcification. Skull: Interval increase in size of a 5.7 x 2.5 cm fluid density lesion within the subcutaneus soft tissues of the occipital scalp. The lesion has slightly thick walls and persistent slight heterogeneity within. This finding likely represents sebaceous cyst. Negative for fracture or focal lesion. Sinuses/Orbits: Paranasal sinuses and mastoid air cells are clear. Status post bilateral  lens replacement. Redemonstration of left glaucoma drainage device. Similar-appearing 1.2 cm fat density lesion lateral to the right orbit anterior to the zygomatic bone that may be surgical versus a benign finding (4:12). Otherwise the orbits are unremarkable. Other: Atherosclerotic calcifications are present within the cavernous internal carotid arteries and bilateral vertebral arteries. IMPRESSION: No acute intracranial abnormality. Electronically Signed   By: Iven Finn M.D.   On: 11/26/2019 15:34   CARDIAC CATHETERIZATION  Result Date: 12/03/2019  Prox RCA to Mid RCA lesion is 20% stenosed.  Dist RCA lesion is 20% stenosed.  RPAV lesion is 40% stenosed.  Mid Cx lesion is 20% stenosed.  Prox LAD lesion is 30% stenosed.  1. Mild non-obstructive CAD 2. Severe aortic stenosis by echo (cath mean gradient 26.8 mmHg0 Recommendations: will continue workup for TAVR. He has had his scans. He can likely be discharged home later today and  we will complete his TAVR workup as an outpatient.   US RENAL  Result Date: 11/30/2019 CLINICAL DATA:  Right-sided hydronephrosis seen incidentally on a recent prior right upper quadrant ultrasound. EXAM: RENAL / URINARY TRACT ULTRASOUND COMPLETE COMPARISON:  Right upper quadrant ultrasound 11/26/2019; prior renal ultrasound 11/10/2013 FINDINGS: Right Kidney: Renal measurements: 10.1 x 4.9 x 3.8 cm = volume: 97.6 mL. Interval resolution of hydronephrosis. Mild diffuse renal cortical thinning with lipomatous hypertrophy of the renal pelvis. The renal parenchyma is mildly echogenic. No solid lesion identified. Left Kidney: Renal measurements: 9.2 x 4.0 x 4.2 cm = volume: 77.3 mL. No evidence of hydronephrosis. Mild diffuse renal cortical thinning with lipomatous hypertrophy of the renal sinus fat. The renal parenchyma is mildly echogenic. No focal solid lesion identified. Bladder: Foley catheter visualized within the collapsed bladder. Other: None. IMPRESSION: 1. Foley  catheter present within the bladder. 2. Interval resolution of hydronephrosis, presumably related to relief of bladder outlet obstruction. 3. Mildly echogenic kidneys bilaterally consistent with underlying medical renal disease. 4. Mild renal cortical thinning bilaterally. Electronically Signed   By: Jacqulynn Cadet M.D.   On: 11/30/2019 10:57   CT CORONARY MORPH W/CTA COR W/SCORE W/CA W/CM &/OR WO/CM  Addendum Date: 12/02/2019   ADDENDUM REPORT: 12/02/2019 13:26 EXAM: OVER-READ INTERPRETATION  CT CHEST The following report is an over-read performed by radiologist Dr. Samara Snide [facility], [place] on 12/02/2019. This over-read does not include interpretation of cardiac or coronary anatomy or pathology. The coronary CTA interpretation by the cardiologist is attached. COMPARISON:  11/26/2019 chest CT angiogram. FINDINGS: Please see the separate concurrent chest CT angiogram report for details. IMPRESSION: Please see the separate concurrent chest CT angiogram report for details. Electronically Signed   By: Ilona Sorrel M.D.   On: 12/02/2019 13:26   Result Date: 12/02/2019 CLINICAL DATA:  Severe Aortic Stenosis. EXAM: Cardiac TAVR CT TECHNIQUE: The patient was scanned on a [employer]. A 120 kV retrospective scan was triggered in the descending thoracic aorta at 111 HU's. Gantry rotation speed was 250 msecs and collimation was .6 mm. No beta blockade or nitro were given. The 3D data set was reconstructed in 5% intervals of the R-R cycle. Systolic and diastolic phases were analyzed on a dedicated work station using MPR, MIP and VRT modes. The patient received 80 cc of contrast. FINDINGS: Image quality: Excellent. Noise artifact is: Limited. Valve Morphology: The aortic valve is tricuspid. The leaflets are severely calcified and severely thickened. Leaflet calcification appears diffuse. The leaflets exhibit severely restricted movement in systole Aortic Valve Calcium score: 2336 Aortic annular  dimension: Phase assessed: 20% Annular area: 462 mm2 Annular perimeter: 77.3 mm Max diameter: 26.1 mm Min diameter: 22.9 mm Annular and subannular calcification: None. Optimal coplanar projection: LAO 1 CRA 3 Coronary Artery Height above Annulus: Left Main: 15.9 mm Right Coronary: 16.3 mm Sinus of Valsalva Measurements: Non-coronary: 34 mm Right-coronary: 33 mm Left-coronary: 34 mm Sinus of Valsalva Height: Non-coronary: 26.0 mm Right-coronary: 23.2 mm Left-coronary: 22.5 mm Sinotubular Junction: 27 mm with mild to moderate calcifications. Ascending Thoracic Aorta: 31 mm with diffuse mixed density atherosclerosis noted. Coronary Arteries: CAC score of 802, which is 80th percentile for age- and sex-matched controls. Normal coronary origin. Right dominance. The study was performed without use of NTG and is insufficient for plaque evaluation. The following assessment was made nonetheless: Left main: The left main is a large caliber vessel with a normal take off from the left coronary cusp that trifurcates into a  LAD, LCX, and ramus intermedius. There is minimal mixed density plaque (<25%). Left anterior descending artery: The proximal LAD contains mild mixed density plaque (25-49%). The mid LAD contains minimal calcified plaque (<25%). The distal LAD contains minimal non-calcified plaque (<25%). Ramus intermedius: Large vessel with minimal calcified plaque (<25%). Left circumflex artery: The LCX is non-dominant with minimal non-calcified plaque (<25%). Right coronary artery: Dominant with normal take off from the [place]. The proximal and mid RCA segments contain minimal calcified plaque (<25%). The distal RCA contains minimal non-calcified plaque. The RCA terminates as a patent PDA and PLV branch. Cardiac Morphology: Right Atrium: Right atrial size is within normal limits. Right Ventricle: The right ventricular cavity is within normal limits. Left Atrium: Left atrial size is normal in size with no left atrial appendage  filling defect. Left Ventricle: The ventricular cavity size is within normal limits. There are no stigmata of prior infarction. There is no abnormal filling defect. Normal left ventricular function, LVEF=72%. No regional wall motion abnormalities. Pulmonary arteries: Normal in size without proximal filling defect. Pulmonary veins: Normal pulmonary venous drainage. Pericardium: Normal thickness with no significant effusion or calcium present. Mitral Valve: The mitral valve is normal structure with mild mitral annular calcification. Extra-cardiac findings: See attached radiology report for non-cardiac structures. IMPRESSION: 1. 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 2. Annular measurements appropriate for 26 mm Edwards Sapien 3 TAVR 3. No significant annular or subannular calcifications. 4. Sufficient coronary to annulus distance. 5. Optimal Fluoroscopic Angle for Delivery: LAO 1 CRA 3 6. Coronary calcium score of 802 which is 80th percentile for age- and sex-matched controls. 7. 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 [place] T. Audie Box, MD Electronically Signed: By: Eleonore Chiquito On: 12/01/2019 22:06   DG Chest Port 1 View  Result Date: 11/26/2019 CLINICAL DATA:  Chest pain. EXAM: PORTABLE CHEST 1 VIEW COMPARISON:  03/25/2018 FINDINGS: Mild enlargement of the cardiac silhouette, similar to prior. Pulmonary vascular congestion. No evidence of pulmonary edema. No consolidation. No visible pleural effusions. No pneumothorax. The visualized skeletal structures are unremarkable. IMPRESSION: 1. Pulmonary vascular congestion without evidence of pulmonary edema. 2. Similar mild cardiomegaly. Electronically Signed   By: Margaretha Sheffield MD   On: 11/26/2019 11:50   CT ANGIO CHEST AORTA W/CM & OR WO/CM  Result Date: 12/02/2019 CLINICAL DATA:  Inpatient. Severe  aortic stenosis. Pre-TAVR planning. EXAM: CT ANGIOGRAPHY CHEST, ABDOMEN AND PELVIS TECHNIQUE: Multidetector CT imaging through the chest, abdomen and pelvis was performed using the standard protocol during bolus administration of intravenous contrast. Multiplanar reconstructed images and MIPs were obtained and reviewed to evaluate the vascular anatomy. CONTRAST:  93m OMNIPAQUE IOHEXOL 350 MG/ML SOLN COMPARISON:  11/26/2019 CT angiogram of the chest, abdomen and pelvis. FINDINGS: CTA CHEST FINDINGS Cardiovascular: Normal heart size. No significant pericardial effusion/thickening. Diffuse thickening and coarse calcification of the aortic valve. Three-vessel coronary atherosclerosis. Atherosclerotic nonaneurysmal thoracic aorta. Normal caliber pulmonary arteries. No central pulmonary emboli. Mediastinum/Nodes: Scattered subcentimeter hypodense bilateral thyroid nodules. Not clinically significant; no follow-up imaging recommended (ref: J Am Coll Radiol. 2015 Feb;12(2): 143-50). Unremarkable esophagus. No pathologically enlarged axillary, mediastinal or hilar lymph nodes. Lungs/Pleura: No pneumothorax. No pleural effusion. No acute consolidative airspace disease or lung masses. Anterior left upper lobe tiny 3 mm solid pulmonary nodule (series 5/image 46), stable since recent chest CT. No additional significant pulmonary nodules. Musculoskeletal: No aggressive  appearing focal osseous lesions. Moderate thoracic spondylosis. Symmetric moderate bilateral gynecomastia, stable. CTA ABDOMEN AND PELVIS FINDINGS Hepatobiliary: Normal liver with no liver mass. Stable distended gallbladder with layering subcentimeter calcified gallstones. No gallbladder wall thickening or pericholecystic fluid. No biliary ductal dilatation. Pancreas: Normal, with no mass or duct dilation. Spleen: Normal size. No mass. Adrenals/Urinary Tract: Left adrenal 2.6 cm nodule with density 80 HU and right adrenal 1.7 cm nodule with density 57 HU, both  characterized as benign adenomas on recent noncontrast chest CT. No overt hydronephrosis. No contour deforming renal masses. Normal nondistended bladder. Stomach/Bowel: Normal non-distended stomach. Normal caliber small bowel with no small bowel wall thickening. Normal appendix. Normal large bowel with no diverticulosis, large bowel wall thickening or pericolonic fat stranding. Vascular/Lymphatic: Atherosclerotic nonaneurysmal abdominal aorta. No pathologically enlarged lymph nodes in the abdomen or pelvis. Reproductive: Mild prostatomegaly. Brachytherapy seeds scattered throughout the prostate. Other: No pneumoperitoneum, ascites or focal fluid collection. Small fat containing left inguinal hernia. Musculoskeletal: No aggressive appearing focal osseous lesions. Mild lumbar spondylosis. VASCULAR MEASUREMENTS PERTINENT TO TAVR: AORTA: Minimal Aortic Diameter-14.8 x 13.9 mm Severity of Aortic Calcification-moderate to severe RIGHT PELVIS: Right Common Iliac Artery - Minimal Diameter-10.2 x 6.0 mm Tortuosity-mild Calcification-severe Right External Iliac Artery - Minimal Diameter-5.5 x 4.6 mm Tortuosity-mild-to-moderate Calcification-severe Right Common Femoral Artery - Minimal Diameter-7.3 x 6.7 mm Tortuosity-mild Calcification-moderate LEFT PELVIS: Left Common Iliac Artery - Minimal Diameter-9.3 x 8.2 mm Tortuosity-mild Calcification-severe Left External Iliac Artery - Minimal Diameter-7.1 x 5.1 mm Tortuosity-mild mild Calcification-severe Left Common Femoral Artery - Minimal Diameter-8.3 x 7.0 mm Tortuosity-mild Calcification-moderate Review of the MIP images confirms the above findings. IMPRESSION: 1. Vascular findings and measurements pertinent to potential TAVR procedure, as detailed. 2. Diffuse thickening and coarse calcification of the aortic valve, compatible with the reported history of severe aortic stenosis. 3. Three-vessel coronary atherosclerosis. 4. Tiny 3 mm solid left upper lobe pulmonary nodule. No  follow-up needed if patient is low-risk. Non-contrast chest CT can be considered in 12 months if patient is high-risk. This recommendation follows the consensus statement: Guidelines for Management of Incidental Pulmonary Nodules Detected on CT Images: From the Fleischner Society 2017; Radiology 2017; 284:228-243. 5. Cholelithiasis. 6. Bilateral adrenal adenomas. 7. Mild prostatomegaly. 8. Small fat containing left inguinal hernia. 9. Aortic Atherosclerosis (ICD10-I70.0). Electronically Signed   By: Ilona Sorrel M.D.   On: 12/02/2019 13:55   ECHOCARDIOGRAM COMPLETE  Result Date: 11/29/2019    ECHOCARDIOGRAM REPORT   Patient Name:   PRUDENCIO VELAZCO Date of Exam: 11/28/2019 Medical Rec #:  [id number]       Height:       60.0 in Accession #:    [id number]      Weight:       162.5 lb Date of Birth:  03-25-1940       BSA:          1.709 m Patient Age:    74 years        BP:           155/71 mmHg Patient Gender: M               HR:           76 bpm. Exam Location:  Inpatient Procedure: 2D Echo Indications:    chest pain 786.50  History:        Patient has prior history of Echocardiogram examinations, most  recent 09/02/2017. End stage renal disease, Aortic Valve Disease;                 Risk Factors:Diabetes and Dyslipidemia.  Sonographer:    Johny Chess Referring Phys: [id number] Champ Mungo TAYLOR  Sonographer Comments: Image acquisition challenging due to respiratory motion. IMPRESSIONS  1. Left ventricular ejection fraction, by estimation, is 65 to 70%. The left ventricle has normal function. The left ventricle has no regional wall motion abnormalities. Left ventricular diastolic parameters are indeterminate.  2. Right ventricular systolic function is normal. The right ventricular size is normal. Tricuspid regurgitation signal is inadequate for assessing PA pressure.  3. The mitral valve is degenerative. Trivial mitral valve regurgitation. No evidence of mitral stenosis. Severe mitral annular calcification.   4. The aortic valve is calcified. There is severe calcifcation of the aortic valve. There is severe thickening of the aortic valve. Aortic valve regurgitation is trivial. 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  5. The inferior vena cava is normal in size with greater than 50% respiratory variability, suggesting right atrial pressure of 3 mmHg. FINDINGS  Left Ventricle: Left ventricular ejection fraction, by estimation, is 65 to 70%. The left ventricle has normal function. The left ventricle has no regional wall motion abnormalities. The left ventricular internal cavity size was normal in size. There is  no left ventricular hypertrophy. Left ventricular diastolic parameters are indeterminate. Right Ventricle: The right ventricular size is normal. No increase in right ventricular wall thickness. Right ventricular systolic function is normal. Tricuspid regurgitation signal is inadequate for assessing PA pressure. Left Atrium: Left atrial size was normal in size. Right Atrium: Right atrial size was normal in size. Pericardium: There is no evidence of pericardial effusion. Mitral Valve: The mitral valve is degenerative in appearance. Severe mitral annular calcification. Trivial mitral valve regurgitation. No evidence of mitral valve stenosis. Tricuspid Valve: The tricuspid valve is grossly normal. Tricuspid valve regurgitation is not demonstrated. Aortic Valve: The aortic valve is calcified. There is severe calcifcation of the aortic valve. There is severe thickening of the aortic valve. Aortic valve regurgitation is trivial. Moderate to severe aortic stenosis is present. Aortic valve mean gradient measures 26.0 mmHg. Aortic valve peak gradient measures 54.8 mmHg. Aortic valve area, by VTI measures 1.06 cm. Pulmonic Valve: The pulmonic valve was not well visualized. Pulmonic valve regurgitation is not visualized. Aorta: The aortic root is normal in size and structure. Venous: The inferior vena cava is normal in size  with greater than 50% respiratory variability, suggesting right atrial pressure of 3 mmHg. IAS/Shunts: No atrial level shunt detected by color flow Doppler.  LEFT VENTRICLE PLAX 2D LVIDd:         3.90 cm  Diastology LVIDs:         2.35 cm  LV e' medial:    4.57 cm/s LV PW:         1.00 cm  LV E/e' medial:  15.5 LV IVS:        0.90 cm  LV e' lateral:   8.05 cm/s LVOT diam:     2.08 cm  LV E/e' lateral: 8.8 LV SV:         76 LV SV Index:   44 LVOT Area:     3.40 cm  RIGHT VENTRICLE             IVC RV S prime:     11.00 cm/s  IVC diam: 1.25 cm TAPSE (M-mode): 2.0 cm LEFT ATRIUM  Index       RIGHT ATRIUM          Index LA diam:        3.40 cm 1.99 cm/m  RA Area:     9.48 cm LA Vol (A2C):   53.7 ml 31.42 ml/m RA Volume:   18.10 ml 10.59 ml/m LA Vol (A4C):   30.6 ml 17.91 ml/m LA Biplane Vol: 41.8 ml 24.46 ml/m  AORTIC VALVE AV Area (Vmax):    0.89 cm AV Area (Vmean):   0.97 cm AV Area (VTI):     1.06 cm AV Vmax:           370.00 cm/s AV Vmean:          231.000 cm/s AV VTI:            0.716 m AV Peak Grad:      54.8 mmHg AV Mean Grad:      26.0 mmHg LVOT Vmax:         96.71 cm/s LVOT Vmean:        66.068 cm/s LVOT VTI:          0.223 m LVOT/AV VTI ratio: 0.31  AORTA Ao Root diam: 3.30 cm MITRAL VALVE MV Area (PHT): 1.94 cm     SHUNTS MV Decel Time: 391 msec     Systemic VTI:  0.22 m MV E velocity: 71.00 cm/s   Systemic Diam: 2.08 cm MV A velocity: 140.00 cm/s MV E/A ratio:  0.51 Mihai Croitoru MD Electronically signed by Sanda Klein MD Signature Date/Time: 11/29/2019/8:09:10 AM    Final    CT Angio Chest/Abd/Pel for Dissection W and/or W/WO  Result Date: 11/26/2019 CLINICAL DATA:  Chest and back pain EXAM: CT ANGIOGRAPHY CHEST, ABDOMEN AND PELVIS TECHNIQUE: Non-contrast CT of the chest was initially obtained. Multidetector CT imaging through the chest, abdomen and pelvis was performed using the standard protocol during bolus administration of intravenous contrast. Multiplanar reconstructed  images and MIPs were obtained and reviewed to evaluate the vascular anatomy. CONTRAST:  175m OMNIPAQUE IOHEXOL 350 MG/ML SOLN COMPARISON:  11/26/2019 FINDINGS: CTA CHEST FINDINGS Cardiovascular: The thoracic aorta is unremarkable without aneurysm or dissection. Moderate atherosclerosis of the aortic arch and at the origin of the great vessels. The heart is unremarkable without pericardial effusion. Calcification of the mitral and aortic valves are noted. Moderate atherosclerosis of the coronary vessels. While not optimized for opacification of the pulmonary vasculature, there is sufficient contrast enhancement to exclude pulmonary emboli. Mediastinum/Nodes: No enlarged mediastinal, hilar, or axillary lymph nodes. Thyroid gland, trachea, and esophagus demonstrate no significant findings. Lungs/Pleura: No acute airspace disease, effusion, or pneumothorax. The central airways are patent. Musculoskeletal: No acute or destructive bony lesions. Reconstructed images demonstrate no additional findings. Review of the MIP images confirms the above findings. CTA ABDOMEN AND PELVIS FINDINGS VASCULAR Aorta: Normal caliber aorta without aneurysm, dissection, vasculitis or significant stenosis. Celiac: Patent without evidence of aneurysm, dissection, vasculitis or significant stenosis. SMA: Patent without evidence of aneurysm, dissection, vasculitis or significant stenosis. Renals: There is severe atherosclerosis of the bilateral renal arteries, left greater than right. Multifocal high-grade stenosis at the origin of the left renal artery. IMA: Patent without evidence of aneurysm, dissection, vasculitis or significant stenosis. Inflow: Extensive atherosclerosis without high-grade stenosis. Veins: No obvious venous abnormality within the limitations of this arterial phase study. Review of the MIP images confirms the above findings. NON-VASCULAR Hepatobiliary: The gallbladder is markedly distended with numerous small calcified  gallstones layering dependently. No gallbladder  wall thickening or surrounding inflammation to suggest cholecystitis. The liver is unremarkable. No biliary dilation. Pancreas: Unremarkable. No pancreatic ductal dilatation or surrounding inflammatory changes. Spleen: Normal in size without focal abnormality. Adrenals/Urinary Tract: 2.7 cm indeterminate left adrenal mass. Right adrenals wrist unremarkable. There is bilateral renal cortical thinning and scarring. Otherwise normal enhancement. There is distension of the bilateral renal pelves and bilateral ureters, without clear cause for obstruction. This could be sequela of chronic bladder outlet obstruction. No filling defects within the urinary bladder. Stomach/Bowel: No bowel obstruction or ileus. There is an enhancing 2.3 cm mass within the lumen of the mid transverse colon, reference axial image 97 and coronal image 36, concerning for neoplasm. Colonoscopy recommended. The bladder is unremarkable. No bowel wall thickening or inflammatory change. Lymphatic: No pathologic adenopathy. Reproductive: Radiotherapy seeds are seen within the prostate. Prostate is mildly enlarged. Other: No free fluid or free gas. No abdominal wall hernia. Musculoskeletal: No acute or destructive bony lesions. Reconstructed images demonstrate no additional findings. Review of the MIP images confirms the above findings. IMPRESSION: 1. No evidence of thoracic or abdominal aortic aneurysm or dissection. No evidence of pulmonary embolus. 2. A 2.3 cm enhancing mass within the lumen of the mid transverse colon, concerning for neoplasm. Colonoscopy recommended for further evaluation. 3. Markedly distended gallbladder with numerous small calcified gallstones layering dependently. No evidence of cholecystitis. 4. 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 If further evaluation is warranted, nonemergent MRI could be performed. 5. Fullness of the  bilateral renal pelves and ureters, without clear cause for obstruction. This could be sequela of chronic bladder outlet obstruction given the enlarged prostate. 6. Aortic Atherosclerosis (ICD10-I70.0). Electronically Signed   By: Randa Ngo M.D.   On: 11/26/2019 15:34   VAS US CAROTID  Result Date: 12/02/2019 Carotid Arterial Duplex Study Indications:       Pre-TAVR. Risk Factors:      Hypertension, hyperlipidemia. Comparison Study:  No prior studies. Performing Technologist: [place] RVT  Examination Guidelines: A complete evaluation includes B-mode imaging, spectral Doppler, color Doppler, and power Doppler as needed of all accessible portions of each vessel. Bilateral testing is considered an integral part of a complete examination. Limited examinations for reoccurring indications may be performed as noted.  Right Carotid Findings: +----------+--------+--------+--------+-----------------------+--------+           PSV cm/sEDV cm/sStenosisPlaque Description     Comments +----------+--------+--------+--------+-----------------------+--------+ CCA Prox  170     14              smooth and heterogenous         +----------+--------+--------+--------+-----------------------+--------+ CCA Distal97      12              smooth and heterogenous         +----------+--------+--------+--------+-----------------------+--------+ ICA Prox  37      7               smooth and heterogenous         +----------+--------+--------+--------+-----------------------+--------+ ICA Distal50      12                                     tortuous +----------+--------+--------+--------+-----------------------+--------+ ECA       77      7                                               +----------+--------+--------+--------+-----------------------+--------+ +----------+--------+-------+--------+-------------------+  PSV cm/sEDV cmsDescribeArm Pressure (mmHG)  +----------+--------+-------+--------+-------------------+ [id number]                                        +----------+--------+-------+--------+-------------------+ +---------+--------+--+--------+-+---------+ VertebralPSV cm/s38EDV cm/s8Antegrade +---------+--------+--+--------+-+---------+  Left Carotid Findings: +----------+--------+--------+--------+-----------------------+--------+           PSV cm/sEDV cm/sStenosisPlaque Description     Comments +----------+--------+--------+--------+-----------------------+--------+ CCA Prox  161     22              smooth and heterogenous         +----------+--------+--------+--------+-----------------------+--------+ CCA Distal77      15              smooth and heterogenous         +----------+--------+--------+--------+-----------------------+--------+ ICA Prox  61      10              calcific                        +----------+--------+--------+--------+-----------------------+--------+ ICA Distal41      10                                     tortuous +----------+--------+--------+--------+-----------------------+--------+ ECA       69      9                                               +----------+--------+--------+--------+-----------------------+--------+ +----------+--------+--------+--------+-------------------+           PSV cm/sEDV cm/sDescribeArm Pressure (mmHG) +----------+--------+--------+--------+-------------------+ Subclavian229                                         +----------+--------+--------+--------+-------------------+ +---------+--------+--+--------+--+---------+ VertebralPSV cm/s54EDV cm/s11Antegrade +---------+--------+--+--------+--+---------+   Summary: Right Carotid: Velocities in the right ICA are consistent with a 1-39% stenosis. Left Carotid: Velocities in the left ICA are consistent with a 1-39% stenosis. Vertebrals: Bilateral vertebral arteries demonstrate  antegrade flow. *See table(s) above for measurements and observations.     Preliminary    CT Angio Abd/Pel w/ and/or w/o  Result Date: 12/02/2019 CLINICAL DATA:  Inpatient. Severe aortic stenosis. Pre-TAVR planning. EXAM: CT ANGIOGRAPHY CHEST, ABDOMEN AND PELVIS TECHNIQUE: Multidetector CT imaging through the chest, abdomen and pelvis was performed using the standard protocol during bolus administration of intravenous contrast. Multiplanar reconstructed images and MIPs were obtained and reviewed to evaluate the vascular anatomy. CONTRAST:  45m OMNIPAQUE IOHEXOL 350 MG/ML SOLN COMPARISON:  11/26/2019 CT angiogram of the chest, abdomen and pelvis. FINDINGS: CTA CHEST FINDINGS Cardiovascular: Normal heart size. No significant pericardial effusion/thickening. Diffuse thickening and coarse calcification of the aortic valve. Three-vessel coronary atherosclerosis. Atherosclerotic nonaneurysmal thoracic aorta. Normal caliber pulmonary arteries. No central pulmonary emboli. Mediastinum/Nodes: Scattered subcentimeter hypodense bilateral thyroid nodules. Not clinically significant; no follow-up imaging recommended (ref: J Am Coll Radiol. 2015 Feb;12(2): 143-50). Unremarkable esophagus. No pathologically enlarged axillary, mediastinal or hilar lymph nodes. Lungs/Pleura: No pneumothorax. No pleural effusion. No acute consolidative airspace disease or lung masses. Anterior left upper lobe tiny 3 mm solid pulmonary nodule (series 5/image 46), stable since recent chest CT. No additional  significant pulmonary nodules. Musculoskeletal: No aggressive appearing focal osseous lesions. Moderate thoracic spondylosis. Symmetric moderate bilateral gynecomastia, stable. CTA ABDOMEN AND PELVIS FINDINGS Hepatobiliary: Normal liver with no liver mass. Stable distended gallbladder with layering subcentimeter calcified gallstones. No gallbladder wall thickening or pericholecystic fluid. No biliary ductal dilatation. Pancreas: Normal, with no  mass or duct dilation. Spleen: Normal size. No mass. Adrenals/Urinary Tract: Left adrenal 2.6 cm nodule with density 80 HU and right adrenal 1.7 cm nodule with density 57 HU, both characterized as benign adenomas on recent noncontrast chest CT. No overt hydronephrosis. No contour deforming renal masses. Normal nondistended bladder. Stomach/Bowel: Normal non-distended stomach. Normal caliber small bowel with no small bowel wall thickening. Normal appendix. Normal large bowel with no diverticulosis, large bowel wall thickening or pericolonic fat stranding. Vascular/Lymphatic: Atherosclerotic nonaneurysmal abdominal aorta. No pathologically enlarged lymph nodes in the abdomen or pelvis. Reproductive: Mild prostatomegaly. Brachytherapy seeds scattered throughout the prostate. Other: No pneumoperitoneum, ascites or focal fluid collection. Small fat containing left inguinal hernia. Musculoskeletal: No aggressive appearing focal osseous lesions. Mild lumbar spondylosis. VASCULAR MEASUREMENTS PERTINENT TO TAVR: AORTA: Minimal Aortic Diameter-14.8 x 13.9 mm Severity of Aortic Calcification-moderate to severe RIGHT PELVIS: Right Common Iliac Artery - Minimal Diameter-10.2 x 6.0 mm Tortuosity-mild Calcification-severe Right External Iliac Artery - Minimal Diameter-5.5 x 4.6 mm Tortuosity-mild-to-moderate Calcification-severe Right Common Femoral Artery - Minimal Diameter-7.3 x 6.7 mm Tortuosity-mild Calcification-moderate LEFT PELVIS: Left Common Iliac Artery - Minimal Diameter-9.3 x 8.2 mm Tortuosity-mild Calcification-severe Left External Iliac Artery - Minimal Diameter-7.1 x 5.1 mm Tortuosity-mild mild Calcification-severe Left Common Femoral Artery - Minimal Diameter-8.3 x 7.0 mm Tortuosity-mild Calcification-moderate Review of the MIP images confirms the above findings. IMPRESSION: 1. Vascular findings and measurements pertinent to potential TAVR procedure, as detailed. 2. Diffuse thickening and coarse calcification of  the aortic valve, compatible with the reported history of severe aortic stenosis. 3. Three-vessel coronary atherosclerosis. 4. Tiny 3 mm solid left upper lobe pulmonary nodule. No follow-up needed if patient is low-risk. Non-contrast chest CT can be considered in 12 months if patient is high-risk. This recommendation follows the consensus statement: Guidelines for Management of Incidental Pulmonary Nodules Detected on CT Images: From the Fleischner Society 2017; Radiology 2017; 284:228-243. 5. Cholelithiasis. 6. Bilateral adrenal adenomas. 7. Mild prostatomegaly. 8. Small fat containing left inguinal hernia. 9. Aortic Atherosclerosis (ICD10-I70.0). Electronically Signed   By: Ilona Sorrel M.D.   On: 12/02/2019 13:55   US Abdomen Limited RUQ  Result Date: 11/26/2019 CLINICAL DATA:  Abdominal pain EXAM: ULTRASOUND ABDOMEN LIMITED RIGHT UPPER QUADRANT COMPARISON:  None. FINDINGS: Gallbladder: The gallbladder is filled with gallbladder sludge and multiple gallstones. The gallbladder is distended. There is a stone that appears to be wedged at the gallbladder neck. There is no gallbladder wall thickening. The sonographic Percell Miller sign is reported as negative. Common bile duct: Diameter: 3 mm Liver: There is a heterogeneous appearance of the liver parenchyma without evidence for a discrete hepatic mass. Portal vein is patent on color Doppler imaging with normal direction of blood flow towards the liver. Other: Incidentally noted is right-sided hydronephrosis. IMPRESSION: 1. Cholelithiasis without definite CT evidence for acute cholecystitis. However, the gallbladder is distended with a stone that appears to be wedged at the gallbladder neck. 2. Incidentally noted right-sided hydronephrosis. Electronically Signed   By: Constance Holster M.D.   On: 11/26/2019 17:39     The results of significant diagnostics from this hospitalization (including imaging, microbiology, ancillary and laboratory) are listed below for  reference.  Microbiology: Recent Results (from the past 240 hour(s))  Respiratory Panel by RT PCR (Flu A&B, Covid) - Nasopharyngeal Swab     Status: None   Collection Time: 11/26/19 11:12 AM   Specimen: Nasopharyngeal Swab  Result Value Ref Range Status   SARS Coronavirus 2 by RT PCR NEGATIVE NEGATIVE Final    Comment: (NOTE) SARS-CoV-2 target nucleic acids are NOT DETECTED.  The SARS-CoV-2 RNA is generally detectable in upper respiratoy specimens during the acute phase of infection. The lowest concentration of SARS-CoV-2 viral copies this assay can detect is 131 copies/mL. A negative result does not preclude SARS-Cov-2 infection and should not be used as the sole basis for treatment or other patient management decisions. A negative result may occur with  improper specimen collection/handling, submission of specimen other than nasopharyngeal swab, presence of viral mutation(s) within the areas targeted by this assay, and inadequate number of viral copies (<131 copies/mL). A negative result must be combined with clinical observations, patient history, and epidemiological information. The expected result is Negative.  Fact Sheet for Patients:  PinkCheek.be  Fact Sheet for Healthcare Providers:  GravelBags.it  This test is no t yet approved or cleared by the [place] FDA and  has been authorized for detection and/or diagnosis of SARS-CoV-2 by FDA under an Emergency Use Authorization (EUA). This EUA will remain  in effect (meaning this test can be used) for the duration of the COVID-19 declaration under Section 564(b)(1) of the Act, 21 U.S.C. section 360bbb-3(b)(1), unless the authorization is terminated or revoked sooner.     Influenza A by PCR NEGATIVE NEGATIVE Final   Influenza B by PCR NEGATIVE NEGATIVE Final    Comment: (NOTE) The Xpert Xpress SARS-CoV-2/FLU/RSV assay is intended as an aid in  the diagnosis  of influenza from Nasopharyngeal swab specimens and  should not be used as a sole basis for treatment. Nasal washings and  aspirates are unacceptable for Xpert Xpress SARS-CoV-2/FLU/RSV  testing.  Fact Sheet for Patients: PinkCheek.be  Fact Sheet for Healthcare Providers: GravelBags.it  This test is not yet approved or cleared by the [place] FDA and  has been authorized for detection and/or diagnosis of SARS-CoV-2 by  FDA under an Emergency Use Authorization (EUA). This EUA will remain  in effect (meaning this test can be used) for the duration of the  Covid-19 declaration under Section 564(b)(1) of the Act, 21  U.S.C. section 360bbb-3(b)(1), unless the authorization is  terminated or revoked. Performed at [facility], [place] [address] [place], [place] [address]   MRSA PCR Screening     Status: None   Collection Time: 11/26/19  9:49 PM   Specimen: Nasal Mucosa; Nasopharyngeal  Result Value Ref Range Status   MRSA by PCR NEGATIVE NEGATIVE Final    Comment:        The GeneXpert MRSA Assay (FDA approved for NASAL specimens only), is one component of a comprehensive MRSA colonization surveillance program. It is not intended to diagnose MRSA infection nor to guide or monitor treatment for MRSA infections. Performed at [facility], [place] [address] [place], [place] [address]      Labs: BNP (last 3 results) No results for input(s): BNP in the last 8760 hours. Basic Metabolic Panel: Recent Labs  Lab 11/26/19 1816 11/27/19 1038 11/28/19 0248 11/28/19 0248 11/30/19 0434 12/01/19 0512 12/02/19 1215 12/03/19 0710 12/03/19 0800  NA  --    < > 139   < > 137 137 135 132* 136  137  K  --    < > 4.1   < > 4.0 4.1 3.6 3.7 3.7  3.5  CL  --    < > 100  --  96* 97* 96* 94*  --   CO2  --    < > 25  --  _0 --   GLUCOSE  --    < > 126*  --  119* 119* 134* 118*  --   BUN  --    < > 41*  --  31*  40* 20 27*  --   CREATININE 7.33*   < > 9.36*  --  7.64* 9.18* 6.44* 8.02*  --   CALCIUM  --    < > 9.5  --  10.3 9.6 9.8 10.1  --   MG 2.4  --  2.3  --   --   --  1.9  --   --   PHOS  --   --   --   --   --  6.5*  --   --   --    < > = values in this interval not displayed.   Liver Function Tests: Recent Labs  Lab 11/27/19 1038 12/01/19 0512  AST 41  --   ALT 14  --   ALKPHOS 72  --   BILITOT 1.3*  --   PROT 6.0*  --   ALBUMIN 3.3* 3.1*   No results for input(s): LIPASE, AMYLASE in the last 168 hours. No results for input(s): AMMONIA in the last 168 hours. CBC: Recent Labs  Lab 11/28/19 0248 11/28/19 0248 11/30/19 0434 12/01/19 0858 12/02/19 1215 12/03/19 0751 12/03/19 0800  WBC 6.3  --  5.6 6.2 4.7 4.9  --   NEUTROABS  --   --   --   --   --  2.2  --   HGB 11.3*   < > 13.5 10.9* 11.6* 11.1* 11.9*  11.6*  HCT 35.0*   < > 40.8 33.8* 35.7* 33.4* 35.0*  34.0*  MCV 101.4*  --  102.5* 102.4* 100.8* 101.5*  --   PLT 208  --  195 228 212 239  --    < > = values in this interval not displayed.   Cardiac Enzymes: No results for input(s): CKTOTAL, CKMB, CKMBINDEX, TROPONINI in the last 168 hours. BNP: Invalid input(s): POCBNP CBG: Recent Labs  Lab 12/02/19 1305 12/02/19 1631 12/02/19 2107 12/03/19 0622 12/03/19 1226  GLUCAP 128* 126* 175* 120* 174*   D-Dimer No results for input(s): DDIMER in the last 72 hours. Hgb A1c No results for input(s): HGBA1C in the last 72 hours. Lipid Profile No results for input(s): CHOL, HDL, LDLCALC, TRIG, CHOLHDL, LDLDIRECT in the last 72 hours. Thyroid function studies No results for input(s): TSH, T4TOTAL, T3FREE, THYROIDAB in the last 72 hours.  Invalid input(s): FREET3 Anemia work up No results for input(s): VITAMINB12, FOLATE, FERRITIN, TIBC, IRON, RETICCTPCT in the last 72 hours. Urinalysis    Component Value Date/Time   COLORURINE YELLOW 11/29/2019 1716   APPEARANCEUR CLEAR 11/29/2019 1716   LABSPEC 1.018 11/29/2019  1716   PHURINE 7.0 11/29/2019 1716   GLUCOSEU 50 (A) 11/29/2019 1716   HGBUR NEGATIVE 11/29/2019 [place] 11/29/2019 1716   BILIRUBINUR negative 02/19/2018 [place] 11/29/2019 1716   PROTEINUR 100 (A) 11/29/2019 1716   UROBILINOGEN 0.2 02/19/2018 1643   UROBILINOGEN 1.0 08/22/2012 1402   NITRITE NEGATIVE 11/29/2019  [place] 11/29/2019 1716   Sepsis Labs Invalid input(s): PROCALCITONIN,  WBC,  LACTICIDVEN Microbiology Recent Results (from the past 240 hour(s))  Respiratory Panel by RT PCR (Flu A&B, Covid) - Nasopharyngeal Swab     Status: None   Collection Time: 11/26/19 11:12 AM   Specimen: Nasopharyngeal Swab  Result Value Ref Range Status   SARS Coronavirus 2 by RT PCR NEGATIVE NEGATIVE Final    Comment: (NOTE) SARS-CoV-2 target nucleic acids are NOT DETECTED.  The SARS-CoV-2 RNA is generally detectable in upper respiratoy specimens during the acute phase of infection. The lowest concentration of SARS-CoV-2 viral copies this assay can detect is 131 copies/mL. A negative result does not preclude SARS-Cov-2 infection and should not be used as the sole basis for treatment or other patient management decisions. A negative result may occur with  improper specimen collection/handling, submission of specimen other than nasopharyngeal swab, presence of viral mutation(s) within the areas targeted by this assay, and inadequate number of viral copies (<131 copies/mL). A negative result must be combined with clinical observations, patient history, and epidemiological information. The expected result is Negative.  Fact Sheet for Patients:  PinkCheek.be  Fact Sheet for Healthcare Providers:  GravelBags.it  This test is no t yet approved or cleared by the [place] FDA and  has been authorized for detection and/or diagnosis of SARS-CoV-2 by FDA under an Emergency Use  Authorization (EUA). This EUA will remain  in effect (meaning this test can be used) for the duration of the COVID-19 declaration under Section 564(b)(1) of the Act, 21 U.S.C. section 360bbb-3(b)(1), unless the authorization is terminated or revoked sooner.     Influenza A by PCR NEGATIVE NEGATIVE Final   Influenza B by PCR NEGATIVE NEGATIVE Final    Comment: (NOTE) The Xpert Xpress SARS-CoV-2/FLU/RSV assay is intended as an aid in  the diagnosis of influenza from Nasopharyngeal swab specimens and  should not be used as a sole basis for treatment. Nasal washings and  aspirates are unacceptable for Xpert Xpress SARS-CoV-2/FLU/RSV  testing.  Fact Sheet for Patients: PinkCheek.be  Fact Sheet for Healthcare Providers: GravelBags.it  This test is not yet approved or cleared by the [place] FDA and  has been authorized for detection and/or diagnosis of SARS-CoV-2 by  FDA under an Emergency Use Authorization (EUA). This EUA will remain  in effect (meaning this test can be used) for the duration of the  Covid-19 declaration under Section 564(b)(1) of the Act, 21  U.S.C. section 360bbb-3(b)(1), unless the authorization is  terminated or revoked. Performed at [facility], [place] [address], [place], [place] [address]   MRSA PCR Screening     Status: None   Collection Time: 11/26/19  9:49 PM   Specimen: Nasal Mucosa; Nasopharyngeal  Result Value Ref Range Status   MRSA by PCR NEGATIVE NEGATIVE Final    Comment:        The GeneXpert MRSA Assay (FDA approved for NASAL specimens only), is one component of a comprehensive MRSA colonization surveillance program. It is not intended to diagnose MRSA infection nor to guide or monitor treatment for MRSA infections. Performed at [facility], [place] [address], [place], [place] [address]      Time coordinating discharge in minutes: 65  SIGNED:   Debbe Odea,  MD  [facility] 12/03/2019, 12:47 PM

## 2019-12-04 ENCOUNTER — Telehealth: Payer: Self-pay | Admitting: Physician Assistant

## 2019-12-04 ENCOUNTER — Telehealth: Payer: Self-pay

## 2019-12-04 ENCOUNTER — Telehealth: Payer: Medicare Other

## 2019-12-04 DIAGNOSIS — E440 Moderate protein-calorie malnutrition: Secondary | ICD-10-CM | POA: Insufficient documentation

## 2019-12-04 LAB — METANEPHRINES, URINE, 24 HOUR
Metaneph Total, Ur: 178 ug/L
Metanephrines, 24H Ur: 89 ug/24 hr (ref 58–276)
Normetanephrine, 24H Ur: 133 ug/24 hr — ABNORMAL LOW (ref 156–729)
Normetanephrine, Ur: 266 ug/L
Total Volume: 500

## 2019-12-04 MED FILL — Lidocaine HCl Local Preservative Free (PF) Inj 1%: INTRAMUSCULAR | Qty: 30 | Status: AC

## 2019-12-04 NOTE — Telephone Encounter (Signed)
Transition of care contact from inpatient facility  Date of discharge: 12/03/19 Date of contact: 12/04/19 Method: Phone Spoke to: Patient  Patient contacted to discuss transition of care from recent inpatient hospitalization. Patient was admitted to [facility] from 11/26/19-12/02/29 with discharge diagnosis of aortic stenosis, adrenal mass, colonic mass, T2DM and ESRD.  Attempted to discuss medication changes and follow up. Patient declined home health and SNF per notes. He tells me his daughter helps with his medication and he is not sure if he needs home health, though he does not specify and specific ADL needs. He requests we call his daughter after 3pm or tomorrow to get her input. We will plan to follow up with his daughter.   Patient will follow up with his/her outpatient HD unit on: 12/05/19  Anice Paganini, PA-C 12/04/2019, 2:16 PM  [facility]

## 2019-12-04 NOTE — Telephone Encounter (Cosign Needed)
  Chronic Care Management   Outreach Note  12/04/2019 Name: Elijah White MRN: [id number] DOB: Feb 18, 1941  Referred by: [name], MD Reason for referral : Chronic Care Management (CCM RN CM FU Call post d/c )   An unsuccessful telephone outreach was attempted today. The patient was referred to the case management team for assistance with care management and care coordination.   Follow Up Plan: Telephone follow up appointment with care management team member scheduled for: 01/04/20  Barb Merino, RN, BSN, CCM Care Management Coordinator [place] [facility]  Direct Phone: [phone]

## 2019-12-05 ENCOUNTER — Telehealth (HOSPITAL_COMMUNITY): Payer: Self-pay | Admitting: Nephrology

## 2019-12-05 DIAGNOSIS — Z23 Encounter for immunization: Secondary | ICD-10-CM | POA: Diagnosis not present

## 2019-12-05 DIAGNOSIS — D631 Anemia in chronic kidney disease: Secondary | ICD-10-CM | POA: Diagnosis not present

## 2019-12-05 DIAGNOSIS — Z992 Dependence on renal dialysis: Secondary | ICD-10-CM | POA: Diagnosis not present

## 2019-12-05 DIAGNOSIS — E118 Type 2 diabetes mellitus with unspecified complications: Secondary | ICD-10-CM | POA: Diagnosis not present

## 2019-12-05 DIAGNOSIS — N2581 Secondary hyperparathyroidism of renal origin: Secondary | ICD-10-CM | POA: Diagnosis not present

## 2019-12-05 DIAGNOSIS — N186 End stage renal disease: Secondary | ICD-10-CM | POA: Diagnosis not present

## 2019-12-05 NOTE — Telephone Encounter (Signed)
Spoke to daughter and Elijah White as follow-up to yesterday's call by Anice Paganini, PA. They have talked things over and now Elijah White is agreeable for home health evaluation and services. He was worried that this would lead to him being moved to a nursing home, now seems to understand that home health services can help keep him in his home longer, which is his goal.  Confidential message sent on 10/2 to his hospital SW/case manager, his PCP, and our dialysis SW to help assist with this request.  Veneta Penton, PA-C [facility] Pager [phone]

## 2019-12-07 ENCOUNTER — Telehealth (HOSPITAL_COMMUNITY): Payer: Self-pay

## 2019-12-07 ENCOUNTER — Telehealth: Payer: Self-pay

## 2019-12-07 ENCOUNTER — Ambulatory Visit: Payer: Medicare Other

## 2019-12-07 DIAGNOSIS — N186 End stage renal disease: Secondary | ICD-10-CM

## 2019-12-07 DIAGNOSIS — E1122 Type 2 diabetes mellitus with diabetic chronic kidney disease: Secondary | ICD-10-CM

## 2019-12-07 NOTE — Telephone Encounter (Signed)
I called and left message on patients voicemail to call Dental Medicine to schedule New Patient appt.

## 2019-12-07 NOTE — Patient Instructions (Signed)
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 which put patient at increased risk for hospitalization  Social Work Clinical Goal(s):   Over the next 90 days the patient and his daughter will work with SW to address care coordination needs  CCM SW Interventions: Completed 12/07/19 with the patient and his daughter Marianna Payment care team collaboration (see longitudinal plan of care)  Successful outbound call placed to the patients daughter and caregiver [name] to assess for post-discharge needs  Determined the patient did receive a call over the weekend from [facility] to complete a TOC call o During this call the patient did agree to home health services o [name] reports she is unclear if home health had been ordered or to which agency  Performed chart review to note PA documented she would inform inpatient SW and PCP of patient decision to participate in home health  Collaboration with Dr. Baird Cancer to determine if she would be the ordering provider  Joint call completed with [name] and the patient to assess for care coordination needs o Discussed the patient is currently receiving MQB Medicaid and is not interested in placement at this time o [name]'s cousin has agreed to assist with patient care needs by dropping food off multiple times a week and checking on the patient while [name] is at work  Ambulance person with Consulting civil engineer to inform of interventions and plan  Scheduled follow up call over the next two weeks  Patient Self Care Activities: With the help  of his daughter  Self administers medications as prescribed  Attends all scheduled provider appointments  Performs ADL's independently  Calls provider office for new concerns or questions  Initial goal documentation         Follow Up Plan: SW will follow up with patient by phone over the next two weeks,  [name], BSW, CDP Social Worker, Certified Dementia Practitioner [place] [facility] [place] [facility] [phone]

## 2019-12-07 NOTE — Telephone Encounter (Signed)
Transition Care Management Follow-up Telephone Call  Date of discharge and from where:12/03/19  How have you been since you were released from the hospital?tired  Any questions or concerns? no  Items Reviewed:  Did the pt receive and understand the discharge instructions provided? Yes   Medications obtained and verified? yes  Any new allergies since your discharge? No   Dietary orders reviewed?no  Do you have support at home? no  Other (ie: DME, Home Health, etc)no  Functional Questionnaire: (I = Independent and D = Dependent)  Bathing/Dressing- i   Meal Prep-i  Eating- i  Maintaining continence- i  Transferring/Ambulation- i  Managing Meds- i   Follow up appointments reviewed:    PCP Hospital f/u appt confirmed? Yes  scheduled to see moore on 12/09/19@ 215pm.  [facility] f/u appt confirmed? Yes  scheduled to see Cardiology on10/20/21   Are transportation arrangements needed? no  If their condition worsens, is the pt aware to call  their PCP or go to the ED?yes  Was the patient provided with contact information for the PCP's office or ED? yes Was the pt encouraged to call back with questions or concerns?yes

## 2019-12-07 NOTE — Chronic Care Management (AMB) (Signed)
Chronic Care Management    Social Work Follow Up Note  12/07/2019 Name: Elijah White MRN: [id number] DOB: 1940-05-06  Elijah White is a 79 y.o. year old male who is a primary care patient of Elijah Peng, MD. The CCM team was consulted for assistance with care coordination.   Review of patient status, including review of consultants reports, other relevant assessments, and collaboration with appropriate care team members and the patient's provider was performed as part of comprehensive patient evaluation and provision of chronic care management services.    SDOH (Social Determinants of Health) assessments performed: No    Outpatient Encounter Medications as of 12/07/2019  Medication Sig Note  . acetaminophen (TYLENOL) 500 MG tablet Take 2 tablets (1,000 mg total) by mouth every 6 (six) hours as needed for mild pain, moderate pain or headache.   [name] apraclonidine (IOPIDINE) 0.5 % ophthalmic solution Place 1 drop into the right eye 3 (three) times daily.    [name] atorvastatin (LIPITOR) 10 MG tablet TAKE 1 TABLET BY MOUTH EVERY DAY (Patient taking differently: Take 10 mg by mouth daily. )   . AURYXIA 1 GM 210 MG(Fe) tablet Take 210 mg by mouth 3 (three) times daily with meals.    . B Complex-C-Folic Acid (DIALYVITE 800) 0.8 MG TABS Take 1 tablet by mouth daily.   . BD PEN NEEDLE NANO U/F 32G X 4 MM MISC USE SUBCUTANEOUS FOUR TIMES A DAY   . blood glucose meter kit and supplies KIT Dispense based on patient and insurance preference. Use up to four times daily as directed. (FOR ICD-9 250.00, 250.01). For QAC - HS accuchecks.   . carvedilol (COREG) 3.125 MG tablet TAKE 1 TABLET(3.125 MG) BY MOUTH TWICE DAILY WITH A MEAL (Patient taking differently: Take 3.125 mg by mouth 2 (two) times daily with a meal. )   . diclofenac Sodium (VOLTAREN) 1 % GEL Apply 4 g topically 4 (four) times daily as needed. (Patient taking differently: Apply 4 g topically 4 (four) times daily as needed (pain). )   .  dorzolamide-timolol (COSOPT) 22.3-6.8 MG/ML ophthalmic solution Place 1 drop into the right eye 2 (two) times daily.   [name] esomeprazole (NEXIUM) 40 MG capsule TAKE 1 CAPSULE BY MOUTH EVERY DAY   . gabapentin (NEURONTIN) 100 MG capsule TAKE 1 CAPSULE(100 MG) BY MOUTH AT BEDTIME (Patient taking differently: Take 100 mg by mouth at bedtime. )   . hydrALAZINE (APRESOLINE) 25 MG tablet TAKE 1 TABLET(25 MG) BY MOUTH TWICE DAILY (Patient taking differently: Take 25 mg by mouth in the morning and at bedtime. )   . hydroxypropyl methylcellulose / hypromellose (ISOPTO TEARS / GONIOVISC) 2.5 % ophthalmic solution Place 1 drop into the left eye as needed for dry eyes.   . isosorbide mononitrate (IMDUR) 30 MG 24 hr tablet TAKE 1 TABLET(30 MG) BY MOUTH DAILY (Patient taking differently: Take 30 mg by mouth daily. )   . Lancets (ONETOUCH DELICA PLUS LANCET33G) MISC USE AS DIRECTED UP TO FOUR TIMES A DAY   . latanoprost (XALATAN) 0.005 % ophthalmic solution Place 1 drop into the right eye at bedtime.   Letta Pate VERIO test strip USE AS DIRECTED TO TEST FOUR TIMES A DAY   . pilocarpine (PILOCAR) 4 % ophthalmic solution Place 1 drop into the right eye 4 (four) times daily.  11/27/2019: Daughter gave a dose of this eye drop in the room.   . tamsulosin (FLOMAX) 0.4 MG CAPS capsule Take 1 capsule (0.4 mg total) by  mouth at bedtime.    No facility-administered encounter medications on file as of 12/07/2019.     Goals Addressed            This Visit's Progress   . Collaborate with RN Care Manager to assist with care coordination needs post inpatient stay       CARE PLAN ENTRY (see longitudinal plan of care for additional care plan information)  Current Barriers:  . Level of care concerns . Social Isolation . Recent inpatient stay from 9/23- 9/30 . Chronic conditions including ESRD and DM II which put patient at increased risk for hospitalization  Social Work Clinical Goal(s):  [name] Over the next 90 days the  patient and his daughter will work with SW to address care coordination needs  CCM SW Interventions: Completed 12/07/19 with the patient and his daughter Elijah White . Inter-disciplinary care team collaboration (see longitudinal plan of care) . Successful outbound call placed to the patients daughter and caregiver Elijah White to assess for post-discharge needs . Determined the patient did receive a call over the weekend from [facility] to complete a TOC call o During this call the patient did agree to home health services o Elijah White reports she is unclear if home health had been ordered or to which agency . Performed chart review to note PA documented she would inform inpatient SW and PCP of patient decision to participate in home health . Collaboration with Dr. Baird Cancer to determine if she would be the ordering provider . Joint call completed with [name] and the patient to assess for care coordination needs o Discussed the patient is currently receiving MQB Medicaid and is not interested in placement at this time o [name]'s cousin has agreed to assist with patient care needs by dropping food off multiple times a week and checking on the patient while Elijah White is at work . Collaboration with RN Care Manager to inform of interventions and plan . Scheduled follow up call over the next two weeks  Patient Self Care Activities: With the help of his daughter . Self administers medications as prescribed . Attends all scheduled provider appointments . Performs ADL's independently . Calls provider office for new concerns or questions  Initial goal documentation         Follow Up Plan: SW will follow up with patient by phone over the next two weeks.   Elijah White, BSW, CDP Social Worker, Certified Dementia Practitioner [place] [facility] [place] [facility] [phone]  Total time spent performing care coordination and/or care management activities with the patient by phone or face to face = 24 minutes.

## 2019-12-08 ENCOUNTER — Encounter: Payer: Self-pay | Admitting: Internal Medicine

## 2019-12-08 ENCOUNTER — Other Ambulatory Visit: Payer: Self-pay | Admitting: Internal Medicine

## 2019-12-08 DIAGNOSIS — N2581 Secondary hyperparathyroidism of renal origin: Secondary | ICD-10-CM | POA: Diagnosis not present

## 2019-12-08 DIAGNOSIS — Z23 Encounter for immunization: Secondary | ICD-10-CM | POA: Diagnosis not present

## 2019-12-08 DIAGNOSIS — I120 Hypertensive chronic kidney disease with stage 5 chronic kidney disease or end stage renal disease: Secondary | ICD-10-CM

## 2019-12-08 DIAGNOSIS — D631 Anemia in chronic kidney disease: Secondary | ICD-10-CM | POA: Diagnosis not present

## 2019-12-08 DIAGNOSIS — E118 Type 2 diabetes mellitus with unspecified complications: Secondary | ICD-10-CM | POA: Diagnosis not present

## 2019-12-08 DIAGNOSIS — E1122 Type 2 diabetes mellitus with diabetic chronic kidney disease: Secondary | ICD-10-CM

## 2019-12-08 DIAGNOSIS — N186 End stage renal disease: Secondary | ICD-10-CM | POA: Diagnosis not present

## 2019-12-08 DIAGNOSIS — R5381 Other malaise: Secondary | ICD-10-CM

## 2019-12-08 DIAGNOSIS — I352 Nonrheumatic aortic (valve) stenosis with insufficiency: Secondary | ICD-10-CM | POA: Diagnosis not present

## 2019-12-08 DIAGNOSIS — I130 Hypertensive heart and chronic kidney disease with heart failure and stage 1 through stage 4 chronic kidney disease, or unspecified chronic kidney disease: Secondary | ICD-10-CM

## 2019-12-08 DIAGNOSIS — Z992 Dependence on renal dialysis: Secondary | ICD-10-CM | POA: Diagnosis not present

## 2019-12-09 ENCOUNTER — Other Ambulatory Visit: Payer: Self-pay

## 2019-12-09 ENCOUNTER — Telehealth (INDEPENDENT_AMBULATORY_CARE_PROVIDER_SITE_OTHER): Payer: Medicare Other | Admitting: Nurse Practitioner

## 2019-12-09 ENCOUNTER — Encounter: Payer: Self-pay | Admitting: Nurse Practitioner

## 2019-12-09 DIAGNOSIS — E278 Other specified disorders of adrenal gland: Secondary | ICD-10-CM

## 2019-12-09 DIAGNOSIS — I350 Nonrheumatic aortic (valve) stenosis: Secondary | ICD-10-CM | POA: Diagnosis not present

## 2019-12-09 DIAGNOSIS — N186 End stage renal disease: Secondary | ICD-10-CM

## 2019-12-09 DIAGNOSIS — E1122 Type 2 diabetes mellitus with diabetic chronic kidney disease: Secondary | ICD-10-CM

## 2019-12-09 DIAGNOSIS — D539 Nutritional anemia, unspecified: Secondary | ICD-10-CM | POA: Diagnosis not present

## 2019-12-09 DIAGNOSIS — Z992 Dependence on renal dialysis: Secondary | ICD-10-CM

## 2019-12-09 DIAGNOSIS — R0789 Other chest pain: Secondary | ICD-10-CM

## 2019-12-09 NOTE — Progress Notes (Signed)
Virtual Visit via Telephone   This visit type was conducted due to national recommendations for restrictions regarding the COVID-19 Pandemic (e.g. social distancing) in an effort to limit this patient's exposure and mitigate transmission in our community.  Due to his co-morbid illnesses, this patient is at least at moderate risk for complications without adequate follow up.  This format is felt to be most appropriate for this patient at this time.  All issues noted in this document were discussed and addressed.  A limited physical exam was performed with this format.    This visit type was conducted due to national recommendations for restrictions regarding the COVID-19 Pandemic (e.g. social distancing) in an effort to limit this patient's exposure and mitigate transmission in our community.  Patients identity confirmed using two different identifiers.  This format is felt to be most appropriate for this patient at this time.  All issues noted in this document were discussed and addressed.  No physical exam was performed (except for noted visual exam findings with Video Visits).    Date:  12/10/2019   ID:  Elijah White, DOB 02-01-1941, MRN [id number]  Patient Location: spoke with Elijah White and his daughter [name]    Provider location:   Office    Chief Complaint: Hospital admission follow up   History of Present Illness:    Elijah White is a 79 y.o. male who presents via video conferencing for a telehealth visit today.    The patient does not have symptoms concerning for COVID-19 infection (fever, chills, cough, or new shortness of breath).   Elijah White is having a virtual visit today after being admitted to the hospital on 9/23 - 9/30.  He presented to the ED with chest pain/epigastric pain, shortness of breath and generalized weakness for 1 day.  He had reported having lost approximately 10-12 pounds in the last 7 months since starting dialysis.  He had elevated troponins  trending 36 to 37 without any acute changes to his EKG.  His CT scan was concerning for 2.3 cm in size transverse colon mass, and 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.  His chest xray showed pulmonary vascular congestion without pulmonary edema and mild cardiomegaly.he was found to have nonobstructive CAD and is scheduled to have a TAVR performed in the upcoming weeks.   He did have a colonoscopy with 4 polyps removed. His HgbA1c was 7.5 without any current medications.  He has bilateral hydronephrosis in which was started on Flomax. The adrenal mass that was found he does not wish to evaluate further. He also had cholelethiasis with cholecystitis.  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   At discharge his daughter wanted him to go to Rehab however he declined. She had been concerned about his safety especially since he is legally blind. He is to continue with dialysis and would like a referral for home health to help with PT/OT.  His daughter will call the pharmacy when he needs a refill on his medications      Past Medical History:  Diagnosis Date  . Anemia    low iron  . Arthritis   . Cancer [facility]) ?2006   prostate  . Chronic kidney disease    Stage 4  . Colon polyps ~ 1993 and 2003   Dr [name], [facility].  08/2001 colonoscopy: tubular adenoma at cecum.    [name]  Diabetes mellitus without complication ([place])   . Elevated cholesterol with high triglycerides   . GERD (gastroesophageal reflux disease)   . Hypertension   . Legally blind in left eye, as defined in [place]    has pinpoint vision in right eye   Past Surgical History:  Procedure Laterality Date  . AV FISTULA PLACEMENT Left 03/20/2018   Procedure: ARTERIOVENOUS (AV) FISTULA CREATION ARM;  Surgeon: [name], MD;  Location: [place];  Service: Vascular;  Laterality: Left;  . BASCILIC VEIN TRANSPOSITION Left  05/21/2018   Procedure: LEFT BASILIC VEIN FISTULA SECOND STAGE;  Surgeon: [name], MD;  Location: [place];  Service: Vascular;  Laterality: Left;  . COLONOSCOPY    . COLONOSCOPY WITH PROPOFOL N/A 11/30/2019   Procedure: COLONOSCOPY WITH PROPOFOL;  Surgeon: Ronnette Juniper, MD;  Location: [place];  Service: Gastroenterology;  Laterality: N/A;  . GLAUCOMA SURGERY  2019   multiple surgeries  . HEMOSTASIS CLIP PLACEMENT  11/30/2019   Procedure: HEMOSTASIS CLIP PLACEMENT;  Surgeon: Ronnette Juniper, MD;  Location: [place];  Service: Gastroenterology;;  . KNEE ARTHROSCOPY    . POLYPECTOMY  11/30/2019   Procedure: POLYPECTOMY;  Surgeon: Ronnette Juniper, MD;  Location: [place];  Service: Gastroenterology;;  . PROSTATE SURGERY    . RIGHT/LEFT HEART CATH AND CORONARY ANGIOGRAPHY N/A 12/03/2019   Procedure: RIGHT/LEFT HEART CATH AND CORONARY ANGIOGRAPHY;  Surgeon: Burnell Blanks, MD;  Location: [facility] [facility];  Service: Cardiovascular;  Laterality: N/A;  . SUBMUCOSAL TATTOO INJECTION  11/30/2019   Procedure: SUBMUCOSAL TATTOO INJECTION;  Surgeon: Ronnette Juniper, MD;  Location[facility] ENDOSCOPY;  Service: Gastroenterology;;     No outpatient medications have been marked as taking for the 12/09/19 encounter (Telemedicine) with Minette Brine, [place].     Allergies:   Patient has no known allergies.   Social History   Tobacco Use  . Smoking status: Former Smoker    Packs/day: 0.50    Quit date: 03/23/2018    Years since quitting: 1.7  . Smokeless tobacco: Never Used  Vaping Use  . Vaping Use: Never used  Substance Use Topics  . Alcohol use: No  . Drug use: No     Family Hx: The patient's family history includes CAD in his mother.  ROS:   Please see the history of present illness.    Review of Systems  Constitutional: Positive for malaise/fatigue.  Respiratory: Negative.   Cardiovascular: Negative.  Negative for leg swelling.  Neurological: Negative for dizziness and  tingling.  Psychiatric/Behavioral: Negative.     All other systems reviewed and are negative.   Labs/Other Tests and Data Reviewed:    Recent Labs: 11/26/2019: TSH 0.820 11/27/2019: ALT 14 12/02/2019: Magnesium 1.9 12/03/2019: BUN 27; Creatinine, Ser 8.02; Hemoglobin 11.6; Hemoglobin 11.9; Platelets 239; Potassium 3.5; Potassium 3.7; Sodium 137; Sodium 136   Recent Lipid Panel Lab Results  Component Value Date/Time   LDLCALC 103 09/11/2019 12:00 AM    Wt Readings from Last 3 Encounters:  12/03/19 165 lb 12.6 oz (75.2 kg)  11/20/19 172 lb (78 kg)  09/02/19 170 lb 3.2 oz (77.2 kg)     Exam:    Vital Signs:  There were no vitals taken for this visit.    Physical Exam Constitutional:      General: He is not in acute distress.    Appearance: Normal appearance.  Pulmonary:     Effort: Pulmonary effort is normal. No respiratory distress.  Neurological:     General:  No focal deficit present.     Mental Status: He is alert and oriented to person, place, and time.     Cranial Nerves: No cranial nerve deficit.  Psychiatric:        Mood and Affect: Mood normal.        Behavior: Behavior normal.        Thought Content: Thought content normal.        Judgment: Judgment normal.     ASSESSMENT & PLAN:    1. Other chest pain  TCM Performed. A member of the clinical team spoke with the patient upon dischare. Discharge summary was reviewed in full detail during the visit. Meds reconciled and compared to discharge meds. Medication list is updated and reviewed with the patient.  Greater than 50% face to face time was spent in counseling an coordination of care.  All questions were answered to the satisfaction of the patient.    His cardiac work up was mostly normal except for slightly elevated troponins and was found to have nonobstructive CAD  2. Macrocytic anemia  Daughter and patient would like to wait until after his upcoming surgery before possibly being referred to  hematology  3. Adrenal mass [facility])  He is not interested in any further treatment  This was an incidental finding  4. Severe aortic stenosis  Planning to have a TAVR procedure.  5. ESRD (end stage renal disease) ([place])  Continue with TThSat dialysis  6. Type 2 diabetes mellitus with chronic kidney disease on chronic dialysis, without long-term current use of insulin (HCC) HgbA1c has increased during his hospital stay, no addition of medications at this time, he is advised to avoid a high carb/sugar diet.  COVID-19 Education: The signs and symptoms of COVID-19 were discussed with the patient and how to seek care for testing (follow up with PCP or arrange E-visit).  The importance of social distancing was discussed today.  Patient Risk:   After full review of this patients clinical status, I feel that they are at least moderate risk at this time.  Time:   Today, I have spent 12 minutes/ seconds with the patient with telehealth technology discussing above diagnoses.     Medication Adjustments/Labs and Tests Ordered: Current medicines are reviewed at length with the patient today.  Concerns regarding medicines are outlined above.   Tests Ordered: No orders of the defined types were placed in this encounter.   Medication Changes: No orders of the defined types were placed in this encounter.   Disposition:  Follow up prn, has an appt in January  Signed, Minette Brine, FNP

## 2019-12-10 DIAGNOSIS — N2581 Secondary hyperparathyroidism of renal origin: Secondary | ICD-10-CM | POA: Diagnosis not present

## 2019-12-10 DIAGNOSIS — D631 Anemia in chronic kidney disease: Secondary | ICD-10-CM | POA: Diagnosis not present

## 2019-12-10 DIAGNOSIS — Z992 Dependence on renal dialysis: Secondary | ICD-10-CM | POA: Diagnosis not present

## 2019-12-10 DIAGNOSIS — E118 Type 2 diabetes mellitus with unspecified complications: Secondary | ICD-10-CM | POA: Diagnosis not present

## 2019-12-10 DIAGNOSIS — N186 End stage renal disease: Secondary | ICD-10-CM | POA: Diagnosis not present

## 2019-12-10 DIAGNOSIS — Z23 Encounter for immunization: Secondary | ICD-10-CM | POA: Diagnosis not present

## 2019-12-12 DIAGNOSIS — N2581 Secondary hyperparathyroidism of renal origin: Secondary | ICD-10-CM | POA: Diagnosis not present

## 2019-12-12 DIAGNOSIS — Z23 Encounter for immunization: Secondary | ICD-10-CM | POA: Diagnosis not present

## 2019-12-12 DIAGNOSIS — Z992 Dependence on renal dialysis: Secondary | ICD-10-CM | POA: Diagnosis not present

## 2019-12-12 DIAGNOSIS — N186 End stage renal disease: Secondary | ICD-10-CM | POA: Diagnosis not present

## 2019-12-12 DIAGNOSIS — D631 Anemia in chronic kidney disease: Secondary | ICD-10-CM | POA: Diagnosis not present

## 2019-12-12 DIAGNOSIS — E118 Type 2 diabetes mellitus with unspecified complications: Secondary | ICD-10-CM | POA: Diagnosis not present

## 2019-12-13 ENCOUNTER — Other Ambulatory Visit: Payer: Self-pay

## 2019-12-13 ENCOUNTER — Observation Stay (HOSPITAL_COMMUNITY)
Admission: EM | Admit: 2019-12-13 | Discharge: 2019-12-15 | Disposition: A | Discharge status: Home / Self Care | Payer: Medicare Other | Attending: Family Medicine | Admitting: Family Medicine

## 2019-12-13 ENCOUNTER — Emergency Department (HOSPITAL_COMMUNITY): Payer: Medicare Other

## 2019-12-13 DIAGNOSIS — E1129 Type 2 diabetes mellitus with other diabetic kidney complication: Secondary | ICD-10-CM | POA: Diagnosis present

## 2019-12-13 DIAGNOSIS — Z992 Dependence on renal dialysis: Secondary | ICD-10-CM

## 2019-12-13 DIAGNOSIS — I5033 Acute on chronic diastolic (congestive) heart failure: Secondary | ICD-10-CM | POA: Insufficient documentation

## 2019-12-13 DIAGNOSIS — R079 Chest pain, unspecified: Secondary | ICD-10-CM | POA: Diagnosis present

## 2019-12-13 DIAGNOSIS — Z743 Need for continuous supervision: Secondary | ICD-10-CM | POA: Diagnosis not present

## 2019-12-13 DIAGNOSIS — Z8546 Personal history of malignant neoplasm of prostate: Secondary | ICD-10-CM | POA: Insufficient documentation

## 2019-12-13 DIAGNOSIS — K219 Gastro-esophageal reflux disease without esophagitis: Secondary | ICD-10-CM | POA: Diagnosis present

## 2019-12-13 DIAGNOSIS — I132 Hypertensive heart and chronic kidney disease with heart failure and with stage 5 chronic kidney disease, or end stage renal disease: Secondary | ICD-10-CM | POA: Diagnosis not present

## 2019-12-13 DIAGNOSIS — I350 Nonrheumatic aortic (valve) stenosis: Secondary | ICD-10-CM | POA: Diagnosis not present

## 2019-12-13 DIAGNOSIS — E1122 Type 2 diabetes mellitus with diabetic chronic kidney disease: Secondary | ICD-10-CM | POA: Diagnosis not present

## 2019-12-13 DIAGNOSIS — R55 Syncope and collapse: Principal | ICD-10-CM | POA: Diagnosis present

## 2019-12-13 DIAGNOSIS — N186 End stage renal disease: Secondary | ICD-10-CM | POA: Diagnosis present

## 2019-12-13 DIAGNOSIS — R29810 Facial weakness: Secondary | ICD-10-CM | POA: Diagnosis not present

## 2019-12-13 DIAGNOSIS — Z87891 Personal history of nicotine dependence: Secondary | ICD-10-CM | POA: Insufficient documentation

## 2019-12-13 DIAGNOSIS — R0602 Shortness of breath: Secondary | ICD-10-CM | POA: Diagnosis not present

## 2019-12-13 DIAGNOSIS — Z7984 Long term (current) use of oral hypoglycemic drugs: Secondary | ICD-10-CM | POA: Insufficient documentation

## 2019-12-13 DIAGNOSIS — N400 Enlarged prostate without lower urinary tract symptoms: Secondary | ICD-10-CM | POA: Diagnosis present

## 2019-12-13 DIAGNOSIS — R531 Weakness: Secondary | ICD-10-CM | POA: Diagnosis not present

## 2019-12-13 DIAGNOSIS — G4489 Other headache syndrome: Secondary | ICD-10-CM | POA: Diagnosis not present

## 2019-12-13 DIAGNOSIS — Z20822 Contact with and (suspected) exposure to covid-19: Secondary | ICD-10-CM | POA: Diagnosis not present

## 2019-12-13 DIAGNOSIS — I10 Essential (primary) hypertension: Secondary | ICD-10-CM | POA: Diagnosis present

## 2019-12-13 DIAGNOSIS — R0789 Other chest pain: Secondary | ICD-10-CM | POA: Diagnosis not present

## 2019-12-13 DIAGNOSIS — Z79899 Other long term (current) drug therapy: Secondary | ICD-10-CM | POA: Insufficient documentation

## 2019-12-13 DIAGNOSIS — R0902 Hypoxemia: Secondary | ICD-10-CM | POA: Diagnosis not present

## 2019-12-13 DIAGNOSIS — N184 Chronic kidney disease, stage 4 (severe): Secondary | ICD-10-CM

## 2019-12-13 LAB — BASIC METABOLIC PANEL
Anion gap: 14 (ref 5–15)
BUN: 23 mg/dL (ref 8–23)
CO2: 29 mmol/L (ref 22–32)
Calcium: 10.7 mg/dL — ABNORMAL HIGH (ref 8.9–10.3)
Chloride: 95 mmol/L — ABNORMAL LOW (ref 98–111)
Creatinine, Ser: 5.56 mg/dL — ABNORMAL HIGH (ref 0.61–1.24)
GFR, Estimated: 9 mL/min — ABNORMAL LOW (ref >60)
Glucose, Bld: 198 mg/dL — ABNORMAL HIGH (ref 70–99)
Potassium: 4.2 mmol/L (ref 3.5–5.1)
Sodium: 138 mmol/L (ref 135–145)

## 2019-12-13 LAB — TROPONIN I (HIGH SENSITIVITY)
Troponin I (High Sensitivity): 28 ng/L — ABNORMAL HIGH (ref <18)
Troponin I (High Sensitivity): 29 ng/L — ABNORMAL HIGH (ref <18)

## 2019-12-13 LAB — CBC
HCT: 37.7 % — ABNORMAL LOW (ref 39.0–52.0)
Hemoglobin: 11.8 g/dL — ABNORMAL LOW (ref 13.0–17.0)
MCH: 32.5 pg (ref 26.0–34.0)
MCHC: 31.3 g/dL (ref 30.0–36.0)
MCV: 103.9 fL — ABNORMAL HIGH (ref 80.0–100.0)
Platelets: 253 10*3/uL (ref 150–400)
RBC: 3.63 MIL/uL — ABNORMAL LOW (ref 4.22–5.81)
RDW: 13 % (ref 11.5–15.5)
WBC: 6.4 10*3/uL (ref 4.0–10.5)
nRBC: 0 % (ref 0.0–0.2)

## 2019-12-13 LAB — RESPIRATORY PANEL BY RT PCR (FLU A&B, COVID)
Influenza A by PCR: NEGATIVE
Influenza B by PCR: NEGATIVE
SARS Coronavirus 2 by RT PCR: NEGATIVE

## 2019-12-13 MED ORDER — ENOXAPARIN SODIUM 30 MG/0.3ML ~~LOC~~ SOLN
30.0000 mg | SUBCUTANEOUS | Status: DC
  Administered 2019-12-13 – 2019-12-14 (×2): 30 mg via SUBCUTANEOUS
  Filled 2019-12-13 (×3): qty 0.3

## 2019-12-13 MED ORDER — PILOCARPINE HCL 4 % OP SOLN: 1.0000 [drp] | Freq: Four times a day (QID) | OPHTHALMIC | Status: DC

## 2019-12-13 MED ORDER — SODIUM CHLORIDE 0.9% FLUSH
3.0000 mL | Freq: Two times a day (BID) | INTRAVENOUS | Status: DC
  Administered 2019-12-13 – 2019-12-14 (×2): 3 mL via INTRAVENOUS

## 2019-12-13 MED ORDER — APRACLONIDINE HCL 1 % OP SOLN
1.0000 [drp] | Freq: Three times a day (TID) | OPHTHALMIC | Status: DC
  Administered 2019-12-14 (×2): 1 [drp] via OPHTHALMIC
  Filled 2019-12-13 (×2): qty 2.4

## 2019-12-13 MED ORDER — INSULIN ASPART 100 UNIT/ML ~~LOC~~ SOLN
0.0000 [IU] | Freq: Three times a day (TID) | SUBCUTANEOUS | Status: DC
  Administered 2019-12-14 (×2): 1 [IU] via SUBCUTANEOUS

## 2019-12-13 MED ORDER — TAMSULOSIN HCL 0.4 MG PO CAPS
0.4000 mg | ORAL_CAPSULE | Freq: Every day | ORAL | Status: DC
  Administered 2019-12-13 – 2019-12-14 (×2): 0.4 mg via ORAL
  Filled 2019-12-13 (×2): qty 1

## 2019-12-13 MED ORDER — ACETAMINOPHEN 500 MG PO TABS: 1000.0000 mg | ORAL_TABLET | Freq: Four times a day (QID) | ORAL | Status: DC | PRN

## 2019-12-13 MED ORDER — ISOSORBIDE MONONITRATE ER 30 MG PO TB24
30.0000 mg | ORAL_TABLET | Freq: Every day | ORAL | Status: DC
  Administered 2019-12-14: 30 mg via ORAL
  Filled 2019-12-13 (×2): qty 1

## 2019-12-13 MED ORDER — DICLOFENAC SODIUM 1 % EX GEL
4.0000 g | Freq: Four times a day (QID) | CUTANEOUS | Status: DC | PRN
  Filled 2019-12-13: qty 100

## 2019-12-13 MED ORDER — HYDRALAZINE HCL 25 MG PO TABS
25.0000 mg | ORAL_TABLET | Freq: Two times a day (BID) | ORAL | Status: DC
  Administered 2019-12-13 – 2019-12-14 (×3): 25 mg via ORAL
  Filled 2019-12-13 (×4): qty 1

## 2019-12-13 MED ORDER — PANTOPRAZOLE SODIUM 40 MG PO TBEC
40.0000 mg | DELAYED_RELEASE_TABLET | Freq: Every day | ORAL | Status: DC
  Administered 2019-12-14: 40 mg via ORAL
  Filled 2019-12-13 (×2): qty 1

## 2019-12-13 MED ORDER — RENA-VITE PO TABS
1.0000 | ORAL_TABLET | Freq: Every day | ORAL | Status: DC
  Administered 2019-12-14 (×2): 1 via ORAL
  Filled 2019-12-13 (×3): qty 1

## 2019-12-13 MED ORDER — ACETAMINOPHEN 650 MG RE SUPP: 650.0000 mg | Freq: Four times a day (QID) | RECTAL | Status: DC | PRN

## 2019-12-13 MED ORDER — PILOCARPINE HCL 4 % OP SOLN
1.0000 [drp] | Freq: Three times a day (TID) | OPHTHALMIC | Status: DC
  Administered 2019-12-14 – 2019-12-15 (×3): 1 [drp] via OPHTHALMIC
  Filled 2019-12-13 (×2): qty 15

## 2019-12-13 MED ORDER — LATANOPROST 0.005 % OP SOLN
1.0000 [drp] | Freq: Every day | OPHTHALMIC | Status: DC
  Administered 2019-12-14: 1 [drp] via OPHTHALMIC
  Filled 2019-12-13 (×2): qty 2.5

## 2019-12-13 MED ORDER — CARVEDILOL 3.125 MG PO TABS
3.1250 mg | ORAL_TABLET | Freq: Two times a day (BID) | ORAL | Status: DC
  Administered 2019-12-14: 3.125 mg via ORAL
  Filled 2019-12-13 (×3): qty 1

## 2019-12-13 MED ORDER — DORZOLAMIDE HCL-TIMOLOL MAL 2-0.5 % OP SOLN
1.0000 [drp] | Freq: Two times a day (BID) | OPHTHALMIC | Status: DC
  Administered 2019-12-14 (×2): 1 [drp] via OPHTHALMIC
  Filled 2019-12-13 (×2): qty 10

## 2019-12-13 MED ORDER — ACETAMINOPHEN 325 MG PO TABS
650.0000 mg | ORAL_TABLET | Freq: Four times a day (QID) | ORAL | Status: DC | PRN
  Administered 2019-12-13: 650 mg via ORAL
  Filled 2019-12-13: qty 2

## 2019-12-13 MED ORDER — ATORVASTATIN CALCIUM 10 MG PO TABS
10.0000 mg | ORAL_TABLET | Freq: Every day | ORAL | Status: DC
  Administered 2019-12-14: 10 mg via ORAL
  Filled 2019-12-13: qty 1

## 2019-12-13 MED ORDER — FERRIC CITRATE 1 GM 210 MG(FE) PO TABS
420.0000 mg | ORAL_TABLET | Freq: Three times a day (TID) | ORAL | Status: DC
  Administered 2019-12-14 (×3): 420 mg via ORAL
  Filled 2019-12-13 (×5): qty 2

## 2019-12-13 MED ORDER — GABAPENTIN 100 MG PO CAPS
100.0000 mg | ORAL_CAPSULE | Freq: Every day | ORAL | Status: DC
  Administered 2019-12-13 – 2019-12-14 (×2): 100 mg via ORAL
  Filled 2019-12-13 (×2): qty 1

## 2019-12-13 MED ORDER — HYPROMELLOSE (GONIOSCOPIC) 2.5 % OP SOLN
1.0000 [drp] | OPHTHALMIC | Status: DC | PRN
  Administered 2019-12-14: 1 [drp] via OPHTHALMIC
  Filled 2019-12-13: qty 15

## 2019-12-13 NOTE — ED Triage Notes (Signed)
C/o weakness, epigastric pain--  Was checked by ems earlier and did not want to come-- this time his daughter drove him [place] an EMS base to get checked.   Pt is dialysis, t, th, sat-- has not missed a treatment.  BLIND-- meets ALONE criteria

## 2019-12-13 NOTE — H&P (Signed)
History and Physical   Elijah White VOH:[id number] DOB: 1940-06-07 DOA: 12/13/2019  PCP: [name], MD   Patient coming from: Home  Chief Complaint: Shortness of breath, chest pain, near syncope  HPI: Elijah White is a 79 y.o. male with medical history significant of diabetes, BPH, ESRD (TTS), diastolic heart failure, severe aortic stenosis, GERD, adrenal mass (declined further evaluation), who presents with chest pain, shortness of breath, increased fatigue, and apparent near syncope.  He was recently admitted from 9/23-9/30 for chest pain and shortness of breath and was found to have severe calcified aortic stenosis during that admission.  Of note, he did go undergo a left heart cath showing stable nonobstructive CAD.  He was discharged home with home health.  He had been doing okay since discharge but had been feeling a little bit more fatigued over the last couple days was especially fatigued and sleepy after dialysis yesterday.  He woke up this morning initially feeling okay but later in the day he began to have intermittent shortness of breath and chest pain/epigastric pain.  He additionally had an episode where he had some headache and felt like he was lightheaded, his daughter reportedly concerned he was going to pass out.  ED Course: In the ED labs showed normal electrolytes, stably elevated creatinine consistent with his ESRD, glucose 198, hemoglobin 11.8 (consistent with previous chronic anemia), flat troponin at 28 and 29.  EKG showed sinus rhythm with right bundle branch block.  Chest x-ray was without acute disease.  Cardiology was consulted from the ED given his recently diagnosed severe aortic stenosis and questionable presyncopal event.  Concern for possible overdiuresis due to correlation of symptoms following dialysis days.  Also stated that observation due to his presyncopal event would be reasonable in order to rule out arrhythmia with consideration of Holter monitor  placement outpatient.  Review of Systems: As per HPI otherwise all other systems reviewed and are negative.  Past Medical History:  Diagnosis Date  . Anemia    low iron  . Arthritis   . Cancer [facility]) ?2006   prostate  . Chronic kidney disease    Stage 4  . Colon polyps ~ 1993 and 2003   Dr [name], [facility].  08/2001 colonoscopy: tubular adenoma at cecum.    . Diabetes mellitus without complication ([place])   . Elevated cholesterol with high triglycerides   . GERD (gastroesophageal reflux disease)   . Hypertension   . Legally blind in left eye, as defined in [place]    has pinpoint vision in right eye    Past Surgical History:  Procedure Laterality Date  . AV FISTULA PLACEMENT Left 03/20/2018   Procedure: ARTERIOVENOUS (AV) FISTULA CREATION ARM;  Surgeon: [name], MD;  Location: [place];  Service: Vascular;  Laterality: Left;  . BASCILIC VEIN TRANSPOSITION Left 05/21/2018   Procedure: LEFT BASILIC VEIN FISTULA SECOND STAGE;  Surgeon: [name], MD;  Location: [place];  Service: Vascular;  Laterality: Left;  . COLONOSCOPY    . COLONOSCOPY WITH PROPOFOL N/A 11/30/2019   Procedure: COLONOSCOPY WITH PROPOFOL;  Surgeon: Ronnette Juniper, MD;  Location: [place];  Service: Gastroenterology;  Laterality: N/A;  . GLAUCOMA SURGERY  2019   multiple surgeries  . HEMOSTASIS CLIP PLACEMENT  11/30/2019   Procedure: HEMOSTASIS CLIP PLACEMENT;  Surgeon: Ronnette Juniper, MD;  Location: [place];  Service: Gastroenterology;;  . KNEE ARTHROSCOPY    . POLYPECTOMY  11/30/2019   Procedure: POLYPECTOMY;  Surgeon: Therisa Doyne,  Megan Salon, MD;  Location: [place];  Service: Gastroenterology;;  . PROSTATE SURGERY    . RIGHT/LEFT HEART CATH AND CORONARY ANGIOGRAPHY N/A 12/03/2019   Procedure: RIGHT/LEFT HEART CATH AND CORONARY ANGIOGRAPHY;  Surgeon: Burnell Blanks, MD;  Location: [place] CV [facility];  Service: Cardiovascular;  Laterality: N/A;  . SUBMUCOSAL TATTOO INJECTION  11/30/2019    Procedure: SUBMUCOSAL TATTOO INJECTION;  Surgeon: Ronnette Juniper, MD;  Location[facility] ENDOSCOPY;  Service: Gastroenterology;;    Social History  reports that he quit smoking about 20 months ago. He smoked 0.50 packs per day. He has never used smokeless tobacco. He reports that he does not drink alcohol and does not use drugs.  No Known Allergies  Family History  Problem Relation Age of Onset  . CAD Mother    Prior to Admission medications   Medication Sig Start Date End Date Taking? Authorizing Provider  acetaminophen (TYLENOL) 500 MG tablet Take 2 tablets (1,000 mg total) by mouth every 6 (six) hours as needed for mild pain, moderate pain or headache. 11/20/19  Yes Burky, Lanelle Bal B, NP  apraclonidine (IOPIDINE) 0.5 % ophthalmic solution Place 1 drop into the right eye 3 (three) times daily.  08/04/19  Yes [provider]  atorvastatin (LIPITOR) 10 MG tablet TAKE 1 TABLET BY MOUTH EVERY DAY Patient taking differently: Take 10 mg by mouth daily.  11/23/19  Yes [name], MD  B Complex-C-Folic Acid (DIALYVITE 127) 0.8 MG TABS Take 1 tablet by mouth daily. 05/08/19  Yes [provider]  carvedilol (COREG) 3.125 MG tablet TAKE 1 TABLET(3.125 MG) BY MOUTH TWICE DAILY WITH A MEAL Patient taking differently: Take 3.125 mg by mouth 2 (two) times daily with a meal.  11/02/19  Yes [name], MD  diclofenac Sodium (VOLTAREN) 1 % GEL Apply 4 g topically 4 (four) times daily as needed. Patient taking differently: Apply 4 g topically 4 (four) times daily as needed (pain).  11/20/19  Yes Burky, Lanelle Bal B, NP  dorzolamide-timolol (COSOPT) 22.3-6.8 MG/ML ophthalmic solution Place 1 drop into the right eye 2 (two) times daily. 06/25/17  Yes [provider]  esomeprazole (NEXIUM) 40 MG capsule TAKE 1 CAPSULE BY MOUTH EVERY DAY Patient taking differently: Take 40 mg by mouth every other day.  06/14/19  Yes [name], MD  ferric citrate (AURYXIA) 1 GM 210 MG(Fe) tablet Take 420 mg  by mouth 3 (three) times daily with meals.   Yes [provider]  gabapentin (NEURONTIN) 100 MG capsule TAKE 1 CAPSULE(100 MG) BY MOUTH AT BEDTIME Patient taking differently: Take 100 mg by mouth at bedtime.  10/12/19  Yes Minette Brine, FNP  hydrALAZINE (APRESOLINE) 25 MG tablet TAKE 1 TABLET(25 MG) BY MOUTH TWICE DAILY Patient taking differently: Take 25 mg by mouth in the morning and at bedtime.  06/25/19  Yes [name], MD  hydroxypropyl methylcellulose / hypromellose (ISOPTO TEARS / GONIOVISC) 2.5 % ophthalmic solution Place 1 drop into the left eye as needed for dry eyes.   Yes [provider]  isosorbide mononitrate (IMDUR) 30 MG 24 hr tablet TAKE 1 TABLET(30 MG) BY MOUTH DAILY Patient taking differently: Take 30 mg by mouth daily.  11/02/19  Yes [name], MD  latanoprost (XALATAN) 0.005 % ophthalmic solution Place 1 drop into the right eye at bedtime.   Yes [provider]  pilocarpine (PILOCAR) 4 % ophthalmic solution Place 1 drop into the right eye 4 (four) times daily.    Yes [provider]  tamsulosin (  FLOMAX) 0.4 MG CAPS capsule Take 1 capsule (0.4 mg total) by mouth at bedtime. 12/03/19  Yes Debbe Odea, MD  BD PEN NEEDLE NANO U/F 32G X 4 MM MISC USE SUBCUTANEOUS FOUR TIMES A DAY 03/31/18   [provider]  blood glucose meter kit and supplies KIT Dispense based on patient and insurance preference. Use up to four times daily as directed. (FOR ICD-9 250.00, 250.01). For QAC - HS accuchecks. 03/30/18   Thurnell Lose, MD  Lancets [facility] DELICA PLUS [id number]) [place] USE AS DIRECTED UP TO FOUR TIMES A DAY 03/31/18   [provider]  ONETOUCH VERIO test strip USE AS DIRECTED TO TEST FOUR TIMES A DAY 07/31/18   [name], MD    Physical Exam: Vitals:   12/13/19 1923 12/13/19 2030 12/13/19 2100 12/13/19 2130  BP:  116/71 122/76 130/68  Pulse:  75 77 74  Resp:  $Remo'18 15 17  'WqYrK$ Temp:      TempSrc:      SpO2:  99% 99% 100%   Weight: 74.8 kg     Height: $Remove'5\' 1"'MWafAsB$  (1.549 m)      Physical Exam Constitutional:      General: He is not in acute distress.    Appearance: Normal appearance.  HENT:     Head: Normocephalic and atraumatic.     Mouth/Throat:     Mouth: Mucous membranes are moist.     Pharynx: Oropharynx is clear.  Eyes:     Extraocular Movements: Extraocular movements intact.     Pupils: Pupils are equal, round, and reactive to light.  Cardiovascular:     Rate and Rhythm: Normal rate and regular rhythm.     Pulses: Normal pulses.     Heart sounds: Murmur heard.   Pulmonary:     Effort: Pulmonary effort is normal. No respiratory distress.     Breath sounds: Normal breath sounds.  Abdominal:     General: Bowel sounds are normal. There is no distension.     Palpations: Abdomen is soft.     Tenderness: There is no abdominal tenderness.  Musculoskeletal:        General: No swelling or deformity.  Skin:    General: Skin is warm and dry.  Neurological:     General: No focal deficit present.     Mental Status: Mental status is at baseline.    Labs on Admission: I have personally reviewed following labs and imaging studies  CBC: Recent Labs  Lab 12/13/19 1715  WBC 6.4  HGB 11.8*  HCT 37.7*  MCV 103.9*  PLT 270    Basic Metabolic Panel: Recent Labs  Lab 12/13/19 1715  NA 138  K 4.2  CL 95*  CO2 29  GLUCOSE 198*  BUN 23  CREATININE 5.56*  CALCIUM 10.7*    GFR: Estimated Creatinine Clearance: 9 mL/min (A) (by C-G formula based on SCr of 5.56 mg/dL (H)).  Liver Function Tests: No results for input(s): AST, ALT, ALKPHOS, BILITOT, PROT, ALBUMIN in the last 168 hours.  Urine analysis:    Component Value Date/Time   COLORURINE YELLOW 11/29/2019 1716   APPEARANCEUR CLEAR 11/29/2019 1716   LABSPEC 1.018 11/29/2019 1716   PHURINE 7.0 11/29/2019 1716   GLUCOSEU 50 (A) 11/29/2019 1716   HGBUR NEGATIVE 11/29/2019 [place] 11/29/2019 1716   BILIRUBINUR negative  02/19/2018 [place] 11/29/2019 1716   PROTEINUR 100 (A) 11/29/2019 1716   UROBILINOGEN 0.2 02/19/2018 1643   UROBILINOGEN  1.0 08/22/2012 1402   NITRITE NEGATIVE 11/29/2019 [place] 11/29/2019 1716    Radiological Exams on Admission: DG Chest 2 View  Result Date: 12/13/2019 CLINICAL DATA:  Initial evaluation for acute chest pain and weakness. EXAM: CHEST - 2 VIEW COMPARISON:  Prior radiograph from 11/26/2019. FINDINGS: Transverse heart size within normal limits. Mediastinal silhouette normal. Lungs are normally inflated. No focal infiltrates. No pulmonary edema or pleural effusion. No acute osseous finding. Surgical clips overlie the left axillary region in GE junction. Degenerative changes noted about the shoulders. IMPRESSION: No active cardiopulmonary disease. Electronically Signed   By: Jeannine Boga M.D.   On: 12/13/2019 18:32    EKG: Independently reviewed.  Sinus rhythm with right bundle branch block  Assessment/Plan Active Problems:   Type 2 diabetes mellitus with renal manifestations (HCC)   BPH (benign prostatic hyperplasia)   Essential hypertension   GERD (gastroesophageal reflux disease)   ESRD (end stage renal disease) (HCC)   Chest pain   Near syncope  Near syncope Severe aortic stenosis Intermittent chest pain shortness of breath - Appreciate cardiology recommendation - Orthostatic vital signs - We will monitor overnight for arrhythmia, Consider Holter monitor - Will not pursue additional work-up due to recent stable echo, and heart cath with nonobstructive CAD  HTN CAD (nonobstructive) Diastolic heart failure - Last echo 12/03/2019 showed EF 60-65% and severe aortic stenosis - Euvolemic with possibility of mildly volume down given symptoms following HD sessions - Continue home atorvastatin, carvedilol, hydralazine, Imdur  ESRD (HD TTS) - Completed HD session yesterday - Possibility of overdiuresis considering  correlation of this symptoms following dialysis  - Electrolytes stable - We will check a.m. labs  BPH - Continue tamsulosin  GERD -Continue PPI  Cataract Glaucoma Vision loss -Continue home eyedrops  DVT prophylaxis: Lovenox Code Status:   DNR Family Communication:        Daughter, [name] [phone], updated by phone  Disposition Plan:   Patient is from:  Home  Anticipated DC to:  Home  Anticipated DC date:  12/14/2019    Consults called:  Cardiology, Narcisse called by EDP, signed off in ED Admission status:  Observation, telemetry  Severity of Illness: The appropriate patient status for this patient is OBSERVATION. Observation status is judged to be reasonable and necessary in order to provide the required intensity of service to ensure the patient's safety. The patient's presenting symptoms, physical exam findings, and initial radiographic and laboratory data in the context of their medical condition is felt to place them at decreased risk for further clinical deterioration. Furthermore, it is anticipated that the patient will be medically stable for discharge from the hospital within 2 midnights of admission. The following factors support the patient status of observation.   " The patient's presenting symptoms include chest pain, shortness of breath, near syncope. " The physical exam findings include murmur. " The initial radiographic and laboratory data are stable.  Marcelyn Bruins MD [facility]  How to contact the [facility] Attending or Consulting provider [place] or covering provider during after hours [place], for this patient?   1. Check the care team in [facility] and look for a) attending/consulting TRH provider listed and b) the [facility] team listed 2. Log into [email or web address] and use [place]'s universal password to access. If you do not have the password, please contact the hospital operator. 3. Locate the [facility] provider you are looking for under [facility] and  page to a number that you  can be directly reached. 4. If you still have difficulty reaching the provider, please page the [facility] (Director on Call) for the Hospitalists listed on amion for assistance.  12/13/2019, 10:00 PM

## 2019-12-13 NOTE — ED Provider Notes (Signed)
[facility] EMERGENCY DEPARTMENT Provider Note   CSN: [id number] Arrival date & time: 12/13/19  1639     History Chief Complaint  Patient presents with  . Weakness    Elijah White is a 79 y.o. male.  HPI Patient presents with episode of shortness of breath epigastric/chest pain and feeling better.  Reportedly was having decreased level of alertness also with the episode.  Feeling somewhat better now.  Had recently had admission to the hospital for colonic polyps obstructing cancer but found to have severe aortic stenosis with plans of a TAVR.  Had been discharged home.  There was talk of rehab placement but wanted to go home.  There was talk of home health but is not been set up yet.  He was able to mop his house today without difficulty.  No swelling in his legs.    Past Medical History:  Diagnosis Date  . Anemia    low iron  . Arthritis   . Cancer [facility]) ?2006   prostate  . Chronic kidney disease    Stage 4  . Colon polyps ~ 1993 and 2003   Dr [name], [facility].  08/2001 colonoscopy: tubular adenoma at cecum.    . Diabetes mellitus without complication ([place])   . Elevated cholesterol with high triglycerides   . GERD (gastroesophageal reflux disease)   . Hypertension   . Legally blind in left eye, as defined in [place]    has pinpoint vision in right eye    Patient Active Problem List   Diagnosis Date Noted  . Severe aortic stenosis   . ESRD (end stage renal disease) ([place]) 11/26/2019  . Macrocytic anemia 11/26/2019  . Adrenal mass ([place]) 11/26/2019  . 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 Chest pain 11/26/2019  . ARF (acute renal failure) ([place]) 03/25/2018  . Acute on chronic diastolic CHF (congestive heart failure) ([place]) 03/25/2018  . 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 Vision loss of right eye 11/11/2017  . Acute decompensated heart failure ([place]) 09/01/2017  . Bilateral pseudophakia 07/11/2017  . 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  GERD (gastroesophageal reflux disease) 09/10/2016  . HLD (hyperlipidemia) 09/10/2016  . Dizziness 08/13/2016  . Renal failure (ARF), acute on chronic (HCC) 08/13/2016  . Essential hypertension 08/13/2016  . Cataract, nuclear 10/29/2012  . 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  . Type 2 diabetes mellitus with renal manifestations ([place]) 10/22/2006  . BENIGN PROSTATIC HYPERTROPHY 10/22/2006  . COLONIC POLYPS, HX OF 10/22/2006    Past Surgical History:  Procedure Laterality Date  . AV FISTULA PLACEMENT Left 03/20/2018   Procedure: ARTERIOVENOUS (AV) FISTULA CREATION ARM;  Surgeon: [name], MD;  Location: [place];  Service: Vascular;  Laterality: Left;  . BASCILIC VEIN TRANSPOSITION Left 05/21/2018   Procedure: LEFT BASILIC VEIN FISTULA SECOND STAGE;  Surgeon: [name], MD;  Location: [place];  Service: Vascular;  Laterality: Left;  . COLONOSCOPY    . COLONOSCOPY WITH PROPOFOL N/A 11/30/2019   Procedure: COLONOSCOPY WITH PROPOFOL;  Surgeon: Ronnette Juniper, MD;  Location: [place];  Service: Gastroenterology;  Laterality: N/A;  . GLAUCOMA SURGERY  2019   multiple surgeries  . HEMOSTASIS CLIP PLACEMENT  11/30/2019   Procedure: HEMOSTASIS CLIP PLACEMENT;  Surgeon: Ronnette Juniper, MD;  Location: [place];  Service: Gastroenterology;;  . KNEE ARTHROSCOPY    . POLYPECTOMY  11/30/2019   Procedure: POLYPECTOMY;  Surgeon: Ronnette Juniper, MD;  Location: [place];  Service:  Gastroenterology;;  . PROSTATE SURGERY    . RIGHT/LEFT HEART CATH AND CORONARY ANGIOGRAPHY N/A 12/03/2019   Procedure: RIGHT/LEFT HEART CATH AND CORONARY ANGIOGRAPHY;  Surgeon: Burnell Blanks, MD;  Location: [facility];  Service: Cardiovascular;  Laterality: N/A;  . SUBMUCOSAL TATTOO INJECTION  11/30/2019   Procedure: SUBMUCOSAL TATTOO INJECTION;  Surgeon: Ronnette Juniper, MD;  Location: [facility];  Service: Gastroenterology;;       Family History  Problem Relation Age of Onset  .  CAD Mother     Social History   Tobacco Use  . Smoking status: Former Smoker    Packs/day: 0.50    Quit date: 03/23/2018    Years since quitting: 1.7  . Smokeless tobacco: Never Used  Vaping Use  . Vaping Use: Never used  Substance Use Topics  . Alcohol use: No  . Drug use: No    Home Medications Prior to Admission medications   Medication Sig Start Date End Date Taking? Authorizing Provider  acetaminophen (TYLENOL) 500 MG tablet Take 2 tablets (1,000 mg total) by mouth every 6 (six) hours as needed for mild pain, moderate pain or headache. 11/20/19   Zigmund Gottron, NP  apraclonidine (IOPIDINE) 0.5 % ophthalmic solution Place 1 drop into the right eye 3 (three) times daily.  08/04/19   [provider]  atorvastatin (LIPITOR) 10 MG tablet TAKE 1 TABLET BY MOUTH EVERY DAY Patient taking differently: Take 10 mg by mouth daily.  11/23/19   [name], MD  AURYXIA 1 GM 210 MG(Fe) tablet Take 210 mg by mouth 3 (three) times daily with meals.  07/20/19   [provider]  B Complex-C-Folic Acid (DIALYVITE 175) 0.8 MG TABS Take 1 tablet by mouth daily. 05/08/19   [provider]  BD PEN NEEDLE NANO U/F 32G X 4 MM MISC USE SUBCUTANEOUS FOUR TIMES A DAY 03/31/18   [provider]  blood glucose meter kit and supplies KIT Dispense based on patient and insurance preference. Use up to four times daily as directed. (FOR ICD-9 250.00, 250.01). For QAC - HS accuchecks. 03/30/18   Thurnell Lose, MD  carvedilol (COREG) 3.125 MG tablet TAKE 1 TABLET(3.125 MG) BY MOUTH TWICE DAILY WITH A MEAL Patient taking differently: Take 3.125 mg by mouth 2 (two) times daily with a meal.  11/02/19   [name], MD  diclofenac Sodium (VOLTAREN) 1 % GEL Apply 4 g topically 4 (four) times daily as needed. Patient taking differently: Apply 4 g topically 4 (four) times daily as needed (pain).  11/20/19   Zigmund Gottron, NP  dorzolamide-timolol (COSOPT) 22.3-6.8 MG/ML ophthalmic  solution Place 1 drop into the right eye 2 (two) times daily. 06/25/17   [provider]  esomeprazole (NEXIUM) 40 MG capsule TAKE 1 CAPSULE BY MOUTH EVERY DAY 06/14/19   [name], MD  gabapentin (NEURONTIN) 100 MG capsule TAKE 1 CAPSULE(100 MG) BY MOUTH AT BEDTIME Patient taking differently: Take 100 mg by mouth at bedtime.  10/12/19   Minette Brine, FNP  hydrALAZINE (APRESOLINE) 25 MG tablet TAKE 1 TABLET(25 MG) BY MOUTH TWICE DAILY Patient taking differently: Take 25 mg by mouth in the morning and at bedtime.  06/25/19   [name], MD  hydroxypropyl methylcellulose / hypromellose (ISOPTO TEARS / GONIOVISC) 2.5 % ophthalmic solution Place 1 drop into the left eye as needed for dry eyes.    [provider]  isosorbide mononitrate (IMDUR) 30 MG 24 hr tablet TAKE 1 TABLET(30 MG) BY  MOUTH DAILY Patient taking differently: Take 30 mg by mouth daily.  11/02/19   [name], MD  Lancets [facility] DELICA PLUS [id number]) MISC USE AS DIRECTED UP TO FOUR TIMES A DAY 03/31/18   [provider]  latanoprost (XALATAN) 0.005 % ophthalmic solution Place 1 drop into the right eye at bedtime.    [provider]  [facility] VERIO test strip USE AS DIRECTED TO TEST FOUR TIMES A DAY 07/31/18   [name], MD  pilocarpine (PILOCAR) 4 % ophthalmic solution Place 1 drop into the right eye 4 (four) times daily.     [provider]  tamsulosin (FLOMAX) 0.4 MG CAPS capsule Take 1 capsule (0.4 mg total) by mouth at bedtime. 12/03/19   Debbe Odea, MD    Allergies    Patient has no known allergies.  Review of Systems   Review of Systems  Constitutional: Negative for appetite change.  HENT: Negative for congestion.   Respiratory: Positive for shortness of breath.   Cardiovascular: Positive for chest pain.  Gastrointestinal: Negative for abdominal distention.  Genitourinary: Negative for flank pain.  Musculoskeletal: Negative for back pain.  Skin: Negative for  rash.  Neurological: Negative for speech difficulty and light-headedness.  Psychiatric/Behavioral: Negative for confusion.    Physical Exam Updated Vital Signs BP (!) 139/59   Pulse 76   Temp 97.6 F (36.4 C) (Oral)   Resp 17   Ht $R'5\' 1"'Eu$  (1.549 m)   Wt 74.8 kg   SpO2 99%   BMI 31.18 kg/m   Physical Exam Vitals reviewed.  HENT:     Head: Normocephalic.  Eyes:     Pupils: Pupils are equal, round, and reactive to light.  Cardiovascular:     Rate and Rhythm: Regular rhythm.     Heart sounds: Murmur heard.      Comments: Loud systolic murmur Pulmonary:     Breath sounds: Normal breath sounds.  Abdominal:     Palpations: Abdomen is soft.  Musculoskeletal:     Right lower leg: No edema.     Left lower leg: No edema.  Neurological:     Mental Status: He is alert.     ED Results / Procedures / Treatments   Labs (all labs ordered are listed, but only abnormal results are displayed) Labs Reviewed  BASIC METABOLIC PANEL - Abnormal; Notable for the following components:      Result Value   Chloride 95 (*)    Glucose, Bld 198 (*)    Creatinine, Ser 5.56 (*)    Calcium 10.7 (*)    GFR, Estimated 9 (*)    All other components within normal limits  CBC - Abnormal; Notable for the following components:   RBC 3.63 (*)    Hemoglobin 11.8 (*)    HCT 37.7 (*)    MCV 103.9 (*)    All other components within normal limits  TROPONIN I (HIGH SENSITIVITY) - Abnormal; Notable for the following components:   Troponin I (High Sensitivity) 28 (*)    All other components within normal limits  TROPONIN I (HIGH SENSITIVITY) - Abnormal; Notable for the following components:   Troponin I (High Sensitivity) 29 (*)    All other components within normal limits    EKG EKG Interpretation  Date/Time:  Sunday December 13 2019 16:46:55 EDT Ventricular Rate:  83 PR Interval:  142 QRS Duration: 120 QT Interval:  398 QTC Calculation: 467 R Axis:   75 Text Interpretation: Normal sinus  rhythm Right bundle  branch block Abnormal ECG No significant change since last tracing Confirmed by Davonna Belling [phone]) on 12/13/2019 5:55:17 PM   Radiology DG Chest 2 View  Result Date: 12/13/2019 CLINICAL DATA:  Initial evaluation for acute chest pain and weakness. EXAM: CHEST - 2 VIEW COMPARISON:  Prior radiograph from 11/26/2019. FINDINGS: Transverse heart size within normal limits. Mediastinal silhouette normal. Lungs are normally inflated. No focal infiltrates. No pulmonary edema or pleural effusion. No acute osseous finding. Surgical clips overlie the left axillary region in GE junction. Degenerative changes noted about the shoulders. IMPRESSION: No active cardiopulmonary disease. Electronically Signed   By: Jeannine Boga M.D.   On: 12/13/2019 18:32    Procedures Procedures (including critical care time)  Medications Ordered in ED Medications - No data to display  ED Course  I have reviewed the triage vital signs and the nursing notes.  Pertinent labs & imaging results that were available during my care of the patient were reviewed by me and considered in my medical decision making (see chart for details).    MDM Rules/Calculators/A&P                          Patient with episode of chest pain and near syncope.  Dialysis patient was dialyzed yesterday.  Has known severe aortic stenosis with plans for a TAVR.  Work-up overall reassuring, however we do not know what his rhythm was at the time of the event.  After discussion with cardiology feel it would be reasonable for an overnight monitoring on telemetry and potentially Holter monitor as an outpatient nothing shows up during this time.  Also lives by himself and potentially would need more help at home.  It appears that home health is going to get set up but is not active yet.  May benefit from some evaluation of this well in the hospitals.  Will discuss with hospitalist Final Clinical Impression(s) / ED  Diagnoses Final diagnoses:  Near syncope  End stage renal disease on dialysis [facility])    Rx / DC Orders ED Discharge Orders    None       Davonna Belling, MD 12/13/19 2044

## 2019-12-13 NOTE — Consult Note (Signed)
Cardiology Consultation:   Patient ID: Elijah White MRN: [id number]; DOB: 1941-02-01  Admit date: 12/13/2019 Date of Consult: 12/13/2019  Primary Care Provider: [name], MD Primary Cardiologist: No primary care provider on file.  Primary Electrophysiologist:  None    Patient Profile:   Elijah White is a 79 y.o. male with a hx of ESRD and severe AS who is being seen today for the evaluation of episodic chest pain, SOB, and fatigue at the request of Dr. Alvino Chapel in the ED.  History of Present Illness:   Elijah White is an 79 year old man with a history of end-stage renal disease, severe aortic stenosis, diastolic heart failure, type 2 diabetes, hypertension, hyperlipidemia who was recently admitted for chest pain, shortness of breath, and fatigue and ultimately found to have severe calcified aortic stenosis.  At that time he underwent left heart catheterization which revealed stable coronary artery disease that was nonobstructive.  Ultimately, he was stabilized and recommended for skilled nursing facility versus home health but chose to go home.  He has been doing okay since discharge and completing his activities of daily living.  Notes that over the last couple days been feeling more fatigued.  Yesterday, went to dialysis and after dialysis notes that he was feeling more sleepy.  But this was intermittent.  Earlier today he woke up with no symptoms and mopped his floor without any issues.  Later in the day began having some intermittent shortness of breath and some chest pain/epigastric pain that he thought was indigestion.  However symptoms continued intermittently so he called his daughter who brought him in for evaluation.  In the emergency department his symptoms have improved.  He notes that he is no longer having chest pain or difficulty breathing.  He is not fluid overloaded.  He has not missed any dialysis sessions.  Laboratory evaluation in the emergency department is  unremarkable and all the labs are at his baseline.  First troponin is 28.  Cardiology is consulted to evaluate whether the symptoms could be triggered by his aortic stenosis.  Past Medical History:  Diagnosis Date  . Anemia    low iron  . Arthritis   . Cancer [place]) ?2006   prostate  . Chronic kidney disease    Stage 4  . Colon polyps ~ 1993 and 2003   Dr [name], [facility].  08/2001 colonoscopy: tubular adenoma at cecum.    . Diabetes mellitus without complication ([place])   . Elevated cholesterol with high triglycerides   . GERD (gastroesophageal reflux disease)   . Hypertension   . Legally blind in left eye, as defined in [place]    has pinpoint vision in right eye    Past Surgical History:  Procedure Laterality Date  . AV FISTULA PLACEMENT Left 03/20/2018   Procedure: ARTERIOVENOUS (AV) FISTULA CREATION ARM;  Surgeon: [name], MD;  Location: [place];  Service: Vascular;  Laterality: Left;  . BASCILIC VEIN TRANSPOSITION Left 05/21/2018   Procedure: LEFT BASILIC VEIN FISTULA SECOND STAGE;  Surgeon: [name], MD;  Location: [place];  Service: Vascular;  Laterality: Left;  . COLONOSCOPY    . COLONOSCOPY WITH PROPOFOL N/A 11/30/2019   Procedure: COLONOSCOPY WITH PROPOFOL;  Surgeon: Ronnette Juniper, MD;  Location: [place];  Service: Gastroenterology;  Laterality: N/A;  . GLAUCOMA SURGERY  2019   multiple surgeries  . HEMOSTASIS CLIP PLACEMENT  11/30/2019   Procedure: HEMOSTASIS CLIP PLACEMENT;  Surgeon: Ronnette Juniper, MD;  Location: [place];  Service: Gastroenterology;;  . KNEE ARTHROSCOPY    . POLYPECTOMY  11/30/2019   Procedure: POLYPECTOMY;  Surgeon: Ronnette Juniper, MD;  Location: [place];  Service: Gastroenterology;;  . PROSTATE SURGERY    . RIGHT/LEFT HEART CATH AND CORONARY ANGIOGRAPHY N/A 12/03/2019   Procedure: RIGHT/LEFT HEART CATH AND CORONARY ANGIOGRAPHY;  Surgeon: Burnell Blanks, MD;  Location: [place] [facility];  Service:  Cardiovascular;  Laterality: N/A;  . SUBMUCOSAL TATTOO INJECTION  11/30/2019   Procedure: SUBMUCOSAL TATTOO INJECTION;  Surgeon: Ronnette Juniper, MD;  Location: [place];  Service: Gastroenterology;;     Home Medications:  Prior to Admission medications   Medication Sig Start Date End Date Taking? Authorizing Provider  acetaminophen (TYLENOL) 500 MG tablet Take 2 tablets (1,000 mg total) by mouth every 6 (six) hours as needed for mild pain, moderate pain or headache. 11/20/19   Zigmund Gottron, NP  apraclonidine (IOPIDINE) 0.5 % ophthalmic solution Place 1 drop into the right eye 3 (three) times daily.  08/04/19   [provider]  atorvastatin (LIPITOR) 10 MG tablet TAKE 1 TABLET BY MOUTH EVERY DAY Patient taking differently: Take 10 mg by mouth daily.  11/23/19   [name], MD  AURYXIA 1 GM 210 MG(Fe) tablet Take 210 mg by mouth 3 (three) times daily with meals.  07/20/19   [provider]  B Complex-C-Folic Acid (DIALYVITE 401) 0.8 MG TABS Take 1 tablet by mouth daily. 05/08/19   [provider]  BD PEN NEEDLE NANO U/F 32G X 4 MM MISC USE SUBCUTANEOUS FOUR TIMES A DAY 03/31/18   [provider]  blood glucose meter kit and supplies KIT Dispense based on patient and insurance preference. Use up to four times daily as directed. (FOR ICD-9 250.00, 250.01). For QAC - HS accuchecks. 03/30/18   Thurnell Lose, MD  carvedilol (COREG) 3.125 MG tablet TAKE 1 TABLET(3.125 MG) BY MOUTH TWICE DAILY WITH A MEAL Patient taking differently: Take 3.125 mg by mouth 2 (two) times daily with a meal.  11/02/19   [name], MD  diclofenac Sodium (VOLTAREN) 1 % GEL Apply 4 g topically 4 (four) times daily as needed. Patient taking differently: Apply 4 g topically 4 (four) times daily as needed (pain).  11/20/19   Zigmund Gottron, NP  dorzolamide-timolol (COSOPT) 22.3-6.8 MG/ML ophthalmic solution Place 1 drop into the right eye 2 (two) times daily. 06/25/17   [provider]  esomeprazole (NEXIUM) 40 MG capsule TAKE 1 CAPSULE BY MOUTH EVERY DAY 06/14/19   [name], MD  gabapentin (NEURONTIN) 100 MG capsule TAKE 1 CAPSULE(100 MG) BY MOUTH AT BEDTIME Patient taking differently: Take 100 mg by mouth at bedtime.  10/12/19   Minette Brine, FNP  hydrALAZINE (APRESOLINE) 25 MG tablet TAKE 1 TABLET(25 MG) BY MOUTH TWICE DAILY Patient taking differently: Take 25 mg by mouth in the morning and at bedtime.  06/25/19   [name], MD  hydroxypropyl methylcellulose / hypromellose (ISOPTO TEARS / GONIOVISC) 2.5 % ophthalmic solution Place 1 drop into the left eye as needed for dry eyes.    [provider]  isosorbide mononitrate (IMDUR) 30 MG 24 hr tablet TAKE 1 TABLET(30 MG) BY MOUTH DAILY Patient taking differently: Take 30 mg by mouth daily.  11/02/19   [name], MD  Lancets [facility] DELICA PLUS [id number]) MISC USE AS DIRECTED UP TO FOUR TIMES A DAY 03/31/18   [provider]  latanoprost (XALATAN) 0.005 % ophthalmic solution Place 1 drop into  the right eye at bedtime.    [provider]  [facility] VERIO test strip USE AS DIRECTED TO TEST FOUR TIMES A DAY 07/31/18   [name], MD  pilocarpine (PILOCAR) 4 % ophthalmic solution Place 1 drop into the right eye 4 (four) times daily.     [provider]  tamsulosin (FLOMAX) 0.4 MG CAPS capsule Take 1 capsule (0.4 mg total) by mouth at bedtime. 12/03/19   Debbe Odea, MD    Inpatient Medications: Scheduled Meds:  Continuous Infusions:  PRN Meds:   Allergies:   No Known Allergies  Social History:   Social History   Socioeconomic History  . Marital status: Widowed    Spouse name: Not on file  . Number of children: Not on file  . Years of education: Not on file  . Highest education level: Not on file  Occupational History  . Occupation: retired  Tobacco Use  . Smoking status: Former Smoker    Packs/day: 0.50    Quit date: 03/23/2018    Years since  quitting: 1.7  . Smokeless tobacco: Never Used  Vaping Use  . Vaping Use: Never used  Substance and Sexual Activity  . Alcohol use: No  . Drug use: No  . Sexual activity: Not Currently  Other Topics Concern  . Not on file  Social History Narrative  . Not on file   Social Determinants of Health   Financial Resource Strain: Low Risk   . Difficulty of Paying Living Expenses: Not hard at all  Food Insecurity: No Food Insecurity  . Worried About Charity fundraiser in the Last Year: Never true  . Ran Out of Food in the Last Year: Never true  Transportation Needs: No Transportation Needs  . Lack of Transportation (Medical): No  . Lack of Transportation (Non-Medical): No  Physical Activity: Inactive  . Days of Exercise per Week: 0 days  . Minutes of Exercise per Session: 0 min  Stress: No Stress Concern Present  . Feeling of Stress : Not at all  Social Connections:   . Frequency of Communication with Friends and Family: Not on file  . Frequency of Social Gatherings with Friends and Family: Not on file  . Attends Religious Services: Not on file  . Active Member of Clubs or Organizations: Not on file  . Attends Archivist Meetings: Not on file  . Marital Status: Not on file  Intimate Partner Violence:   . Fear of Current or Ex-Partner: Not on file  . Emotionally Abused: Not on file  . Physically Abused: Not on file  . Sexually Abused: Not on file    Family History:   Family History  Problem Relation Age of Onset  . CAD Mother      Review of Systems: [y] = yes, [ ]  = no     General: Weight gain [ ] ; Weight loss [ ] ; Anorexia [ ] ; Fatigue Blue.Reese ]; Fever [ ] ; Chills [ ] ; Weakness [ ]    Cardiac: Chest pain/pressure [ ] ; Resting SOB Blue.Reese ]; Exertional SOB [ ] ; Orthopnea [ ] ; Pedal Edema [ ] ; Palpitations [ ] ; Syncope [ ] ; Presyncope [ ] ; Paroxysmal nocturnal dyspnea[ ]    Pulmonary: Cough [ ] ; Wheezing[ ] ; Hemoptysis[ ] ; Sputum [ ] ; Snoring [ ]    GI: Vomiting[ ] ;  Dysphagia[ ] ; Melena[ ] ; Hematochezia [ ] ; Heartburn[ ] ; Abdominal pain [ ] ; Constipation [ ] ; Diarrhea [ ] ; BRBPR [ ]    GU: Hematuria[ ] ; Dysuria [ ] ;  Nocturia[ ]    Vascular: Pain in legs with walking [ ] ; Pain in feet with lying flat [ ] ; Non-healing sores [ ] ; Stroke [ ] ; TIA [ ] ; Slurred speech [ ] ;   Neuro: Headaches[ ] ; Vertigo[ ] ; Seizures[ ] ; Paresthesias[ ] ;Blurred vision [ ] ; Diplopia [ ] ; Vision changes [ ]    Ortho/Skin: Arthritis [ ] ; Joint pain [ ] ; Muscle pain [ ] ; Joint swelling [ ] ; Back Pain [ ] ; Rash [ ]    Psych: Depression[ ] ; Anxiety[ ]    Heme: Bleeding problems [ ] ; Clotting disorders [ ] ; Anemia [ ]    Endocrine: Diabetes [ ] ; Thyroid dysfunction[ ]   Physical Exam/Data:   Vitals:   12/13/19 1658 12/13/19 1659 12/13/19 1923 12/13/19 1923  BP: 118/61 128/68 (!) 139/59   Pulse: 83 85 76   Resp: 16 16 17    Temp: 98.6 F (37 C) 97.6 F (36.4 C)    TempSrc: Oral Oral    SpO2: 100% 100% 99%   Weight:    74.8 kg  Height:    5\' 1"  (1.549 m)   No intake or output data in the 24 hours ending 12/13/19 2005 Filed Weights   12/13/19 1923  Weight: 74.8 kg   Body mass index is 31.18 kg/m.  General: Alert male in no acute distress HEENT: normal Lymph: no adenopathy Neck: no JVD Endocrine:  No thryomegaly Vascular: No carotid bruits; FA pulses 2+ bilaterally without bruits  Cardiac:  normal S1, S2; RRR; harsh systolic ejection murmur  Lungs:  clear to auscultation bilaterally, no wheezing, rhonchi or rales  Abd: soft, nontender, no hepatomegaly  Ext: no edema Musculoskeletal:  No deformities, BUE and BLE strength normal and equal Skin: warm and dry  Neuro:  CNs 2-12 intact, no focal abnormalities noted Psych:  Normal affect   EKG:  The EKG was personally reviewed and demonstrates:  NSR Telemetry:  Telemetry was personally reviewed and demonstrates:  NSR hr in the 70s   Relevant CV Studies: Recent Echo with mod-sever AS  Laboratory  Data:  Chemistry Recent Labs  Lab 12/13/19 1715  NA 138  K 4.2  CL 95*  CO2 29  GLUCOSE 198*  BUN 23  CREATININE 5.56*  CALCIUM 10.7*  GFRNONAA 9*  ANIONGAP 14    No results for input(s): PROT, ALBUMIN, AST, ALT, ALKPHOS, BILITOT in the last 168 hours. Hematology Recent Labs  Lab 12/13/19 1715  WBC 6.4  RBC 3.63*  HGB 11.8*  HCT 37.7*  MCV 103.9*  MCH 32.5  MCHC 31.3  RDW 13.0  PLT 253   Cardiac EnzymesNo results for input(s): TROPONINI in the last 168 hours. No results for input(s): TROPIPOC in the last 168 hours.  BNPNo results for input(s): BNP, PROBNP in the last 168 hours.  DDimer No results for input(s): DDIMER in the last 168 hours.  Radiology/Studies:  DG Chest 2 View  Result Date: 12/13/2019 CLINICAL DATA:  Initial evaluation for acute chest pain and weakness. EXAM: CHEST - 2 VIEW COMPARISON:  Prior radiograph from 11/26/2019. FINDINGS: Transverse heart size within normal limits. Mediastinal silhouette normal. Lungs are normally inflated. No focal infiltrates. No pulmonary edema or pleural effusion. No acute osseous finding. Surgical clips overlie the left axillary region in GE junction. Degenerative changes noted about the shoulders. IMPRESSION: No active cardiopulmonary disease. Electronically Signed   By: Jeannine Boga M.D.   On: 12/13/2019 18:32    Assessment and Plan:   1. Severe AS.  His symptoms appear to be episodic,  however not clearly related to his aortic stenosis.  Given that he feels a similar symptoms after dialysis, wonder if he is slightly over diuresed as he appears euvolemic at the time of exam.  Nothing to suggest that his symptoms are triggered by an arrhythmia, however given episodic nature would not be unreasonable to monitor on telemetry to see if he has the symptoms.  If he does not have symptoms in house, could consider Holter monitor.  Nothing else to do from a cardiology standpoint at this time.      For questions or updates,  please contact [place] Please consult [email or web address] for contact info under     Signed, Doyne Keel, MD  12/13/2019 8:05 PM

## 2019-12-14 ENCOUNTER — Other Ambulatory Visit (HOSPITAL_COMMUNITY): Payer: Medicare Other | Admitting: Dentistry

## 2019-12-14 DIAGNOSIS — I350 Nonrheumatic aortic (valve) stenosis: Secondary | ICD-10-CM | POA: Diagnosis not present

## 2019-12-14 DIAGNOSIS — Z01818 Encounter for other preprocedural examination: Secondary | ICD-10-CM | POA: Diagnosis not present

## 2019-12-14 DIAGNOSIS — R55 Syncope and collapse: Secondary | ICD-10-CM | POA: Diagnosis not present

## 2019-12-14 DIAGNOSIS — R079 Chest pain, unspecified: Secondary | ICD-10-CM | POA: Diagnosis not present

## 2019-12-14 LAB — CBC
HCT: 32.9 % — ABNORMAL LOW (ref 39.0–52.0)
Hemoglobin: 10.8 g/dL — ABNORMAL LOW (ref 13.0–17.0)
MCH: 34.2 pg — ABNORMAL HIGH (ref 26.0–34.0)
MCHC: 32.8 g/dL (ref 30.0–36.0)
MCV: 104.1 fL — ABNORMAL HIGH (ref 80.0–100.0)
Platelets: 231 10*3/uL (ref 150–400)
RBC: 3.16 MIL/uL — ABNORMAL LOW (ref 4.22–5.81)
RDW: 12.9 % (ref 11.5–15.5)
WBC: 5.5 10*3/uL (ref 4.0–10.5)
nRBC: 0 % (ref 0.0–0.2)

## 2019-12-14 LAB — BASIC METABOLIC PANEL
Anion gap: 12 (ref 5–15)
BUN: 24 mg/dL — ABNORMAL HIGH (ref 8–23)
CO2: 29 mmol/L (ref 22–32)
Calcium: 10.1 mg/dL (ref 8.9–10.3)
Chloride: 97 mmol/L — ABNORMAL LOW (ref 98–111)
Creatinine, Ser: 6.54 mg/dL — ABNORMAL HIGH (ref 0.61–1.24)
GFR, Estimated: 7 mL/min — ABNORMAL LOW (ref >60)
Glucose, Bld: 174 mg/dL — ABNORMAL HIGH (ref 70–99)
Potassium: 3.9 mmol/L (ref 3.5–5.1)
Sodium: 138 mmol/L (ref 135–145)

## 2019-12-14 LAB — GLUCOSE, CAPILLARY
Glucose-Capillary: 178 mg/dL — ABNORMAL HIGH (ref 70–99)
Glucose-Capillary: 173 mg/dL — ABNORMAL HIGH (ref 70–99)
Glucose-Capillary: 205 mg/dL — ABNORMAL HIGH (ref 70–99)

## 2019-12-14 LAB — CBG MONITORING, ED: Glucose-Capillary: 127 mg/dL — ABNORMAL HIGH (ref 70–99)

## 2019-12-14 NOTE — Progress Notes (Addendum)
Progress Note  Patient Name: Elijah White Date of Encounter: 12/14/2019  [place] Cardiologist: No primary care provider on file.   Subjective   Feeling well  Inpatient Medications    Scheduled Meds: . apraclonidine  1 drop Right Eye TID  . atorvastatin  10 mg Oral q1800  . carvedilol  3.125 mg Oral BID WC  . dorzolamide-timolol  1 drop Right Eye BID  . enoxaparin (LOVENOX) injection  30 mg Subcutaneous Q24H  . ferric citrate  420 mg Oral TID WC  . gabapentin  100 mg Oral QHS  . hydrALAZINE  25 mg Oral BID  . insulin aspart  0-6 Units Subcutaneous TID WC  . isosorbide mononitrate  30 mg Oral Daily  . latanoprost  1 drop Right Eye QHS  . multivitamin  1 tablet Oral QHS  . pantoprazole  40 mg Oral Daily  . pilocarpine  1 drop Right Eye TID AC & HS  . sodium chloride flush  3 mL Intravenous Q12H  . tamsulosin  0.4 mg Oral QHS   Continuous Infusions:  PRN Meds: acetaminophen **OR** acetaminophen, diclofenac Sodium, hydroxypropyl methylcellulose / hypromellose   Vital Signs    Vitals:   12/14/19 0300 12/14/19 0648 12/14/19 0957 12/14/19 1102  BP: 101/60 138/62 135/80 131/61  Pulse: 75 86 84 82  Resp: 15 19 16 20   Temp:    98 F (36.7 C)  TempSrc:    Oral  SpO2: 96% 97% 96% 100%  Weight:      Height:       No intake or output data in the 24 hours ending 12/14/19 1139 Last 3 Weights 12/13/2019 12/03/2019 12/03/2019  Weight (lbs) 165 lb 165 lb 12.6 oz 166 lb 7.2 oz  Weight (kg) 74.844 kg 75.2 kg 75.5 kg      Telemetry    Not on telemetry  ECG    No new tracing  Physical Exam   GEN: No acute distress.   Neck: No JVD Cardiac: RRR, 3/6 systolic murmur, no rubs, or gallops.  Respiratory: Clear to auscultation bilaterally. GI: Soft, nontender, non-distended  MS: No edema; No deformity. Neuro:  Nonfocal  Psych: Normal affect   Labs    High Sensitivity Troponin:   Recent Labs  Lab 11/26/19 1112 11/26/19 1312 11/26/19 1816 12/13/19 1715  12/13/19 1938  TROPONINIHS 36* 37* 41* 28* 29*      Chemistry Recent Labs  Lab 12/13/19 1715 12/14/19 0245  NA 138 138  K 4.2 3.9  CL 95* 97*  CO2 29 29  GLUCOSE 198* 174*  BUN 23 24*  CREATININE 5.56* 6.54*  CALCIUM 10.7* 10.1  GFRNONAA 9* 7*  ANIONGAP 14 12     Hematology Recent Labs  Lab 12/13/19 1715 12/14/19 0245  WBC 6.4 5.5  RBC 3.63* 3.16*  HGB 11.8* 10.8*  HCT 37.7* 32.9*  MCV 103.9* 104.1*  MCH 32.5 34.2*  MCHC 31.3 32.8  RDW 13.0 12.9  PLT 253 231    BNPNo results for input(s): BNP, PROBNP in the last 168 hours.   DDimer No results for input(s): DDIMER in the last 168 hours.   Radiology    DG Chest 2 View  Result Date: 12/13/2019 CLINICAL DATA:  Initial evaluation for acute chest pain and weakness. EXAM: CHEST - 2 VIEW COMPARISON:  Prior radiograph from 11/26/2019. FINDINGS: Transverse heart size within normal limits. Mediastinal silhouette normal. Lungs are normally inflated. No focal infiltrates. No pulmonary edema or pleural effusion. No acute osseous  finding. Surgical clips overlie the left axillary region in GE junction. Degenerative changes noted about the shoulders. IMPRESSION: No active cardiopulmonary disease. Electronically Signed   By: Jeannine Boga M.D.   On: 12/13/2019 18:32    Cardiac Studies   Cath: 12/03/19   Prox RCA to Mid RCA lesion is 20% stenosed.  Dist RCA lesion is 20% stenosed.  RPAV lesion is 40% stenosed.  Mid Cx lesion is 20% stenosed.  Prox LAD lesion is 30% stenosed.   1. Mild non-obstructive CAD 2. Severe aortic stenosis by echo (cath mean gradient 26.8 mmHg0  Recommendations: will continue workup for TAVR. He has had his scans. He can likely be discharged home later today and we will complete his TAVR workup as an outpatient.   Echo: 11/28/19  IMPRESSIONS    1. Left ventricular ejection fraction, by estimation, is 65 to 70%. The  left ventricle has normal function. The left ventricle has no  regional  wall motion abnormalities. Left ventricular diastolic parameters are  indeterminate.  2. Right ventricular systolic function is normal. The right ventricular  size is normal. Tricuspid regurgitation signal is inadequate for assessing  PA pressure.  3. The mitral valve is degenerative. Trivial mitral valve regurgitation.  No evidence of mitral stenosis. Severe mitral annular calcification.  4. The aortic valve is calcified. There is severe calcifcation of the  aortic valve. There is severe thickening of the aortic valve. Aortic valve  regurgitation is trivial. 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  5. The inferior vena cava is normal in size with greater than 50%  respiratory variability, suggesting right atrial pressure of 3 mmHg.   Patient Profile     79 y.o. male with a hx of ESRD and severe AS who is being seen today for the evaluation of episodic chest pain, SOB, and fatigue at the request of Dr. Alvino Chapel in the ED.  Assessment & Plan    1.  Severe aortic stenosis: He reports feeling well this morning.  No shortness of breath or chest tightness.  He has been evaluated by the TAVR team and currently undergoing outpatient work-up.  He was to be evaluated by the dentist today, but missed his appointment secondary to being in the ER.  The structural team has asked that Dr. Humphrey Rolls (dentistry) evaluate the patient while he is here.  Patient is requesting to be discharged home today.  If so we will ensure he has scheduled follow-up arranged.  2.  ESRD on HD: Had HD 10/9.  Morning electrolytes stable.   For questions or updates, please contact [place] Please consult [email or web address] for contact info under        Signed, Reino Bellis, NP  12/14/2019, 11:39 AM     I have personally seen and examined this patient. I agree with the assessment and plan as outlined above.  He is feeling better today. I do not recommend any further inpatient testing. We are  planning TAVR. He needs the dental consult. The dental team has graciously agreed to see him today. He could be discharged home after that. We have plans for outpatient visit with the surgeon on our TAVR team. We will be in touch with him following d/c.   Lauree Chandler 12/14/2019 12:03 PM

## 2019-12-14 NOTE — Progress Notes (Signed)
Received pt from ED [facility], pt denies cp or sob

## 2019-12-14 NOTE — Consult Note (Signed)
DENTAL CONSULTATION  Date of Consultation:  12/14/2019 Referring Provider:                  Lauree Chandler, MD  Patient Name:   Elijah White Date of Birth:   [birth year] Medical Record Number: [id number]   _____________________________________________  PLAN/RECOMMENDATIONS: . There are no signs of current acute abscess or infection at this time.  . Recommend extractions of multiple grossly decayed teeth that are chronically infected to eliminate risk of pain/infection perioperatively and post-operatively. . We are able to schedule the patient in the OR under GA if his medical team agrees with the plan and proceeding with extractions prior to TAVR.  [name] Recommend patient establish care at a dental office of his choice once optimized medically for routine dental care. . Discussed in detail all options with the patient and he is agreeable to the plan. _____________________________________________    COVID 19 SCREENING: The patient denies symptoms concerning for COVID-19 infection including fever, chills, cough, or newly developed shortness of breath.  VITAL SIGNS: BP (!) 153/73   Pulse 77   Temp 97.9 F (36.6 C)   Resp 20   Ht 5\' 1"  (1.549 m)   Wt 74.8 kg   SpO2 100%   BMI 31.18 kg/m   CHIEF COMPLAINT: "I know I have quite a few broken teeth."  HPI: Elijah White is a very pleasant 79 y.o. male with ESRD, HTN, Diabetes mellitus Type II and severe AS, anticipating TAVR and is currently being worked-up for surgery as an outpatient who is currently admitted due to recent h/o SOB and some chest pain.  He has dialysis T TH and Sat usually.  Dentistry was consulted for an evaluation as part of pre-cardiac surgery protocol. Dental History: The patient reports not having a dentist that he sees regularly, and that it has been several years since he has least seen one.  He currently denies any dental/oral pain or sensitivity.  He reports that he knows he has several broken down  teeth that are bad.  He denies any difficulty maintaining adequate nutrition, but it does take him a while to chew foods and swallow due to his teeth.   PROBLEM LIST: Patient Active Problem List   Diagnosis Date Noted  . Near syncope 12/13/2019  . Severe aortic stenosis   . ESRD (end stage renal disease) ([place]) 11/26/2019  . Macrocytic anemia 11/26/2019  . Adrenal mass ([place]) 11/26/2019  . 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 Chest pain 11/26/2019  . ARF (acute renal failure) ([place]) 03/25/2018  . Chronic diastolic CHF (congestive heart failure) ([place]) 03/25/2018  . 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 Vision loss of right eye 11/11/2017  . Acute decompensated heart failure ([place]) 09/01/2017  . Bilateral pseudophakia 07/11/2017  . 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 GERD (gastroesophageal reflux disease) 09/10/2016  . HLD (hyperlipidemia) 09/10/2016  . Dizziness 08/13/2016  . Renal failure (ARF), acute on chronic (HCC) 08/13/2016  . Essential hypertension 08/13/2016  . Cataract, nuclear 10/29/2012  . 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  . Type 2 diabetes mellitus with renal manifestations ([place]) 10/22/2006  . BPH (benign prostatic hyperplasia) 10/22/2006  . COLONIC POLYPS, HX OF 10/22/2006   PMH: Past Medical History:  Diagnosis Date  . Anemia    low iron  . Arthritis   . Cancer [facility]) ?2006   prostate  . Chronic kidney disease    Stage 4  . Colon polyps ~ 1993 and  2003   Dr [name], [facility].  08/2001 colonoscopy: tubular adenoma at cecum.    . Diabetes mellitus without complication ([place])   . Elevated cholesterol with high triglycerides   . GERD (gastroesophageal reflux disease)   . Hypertension   . Legally blind in left eye, as defined in [place]    has pinpoint vision in right eye   PSH: Past Surgical History:  Procedure Laterality Date  . AV FISTULA PLACEMENT Left 03/20/2018   Procedure: ARTERIOVENOUS (AV) FISTULA CREATION ARM;  Surgeon: [name], MD;  Location: [place];  Service: Vascular;  Laterality: Left;  . BASCILIC VEIN TRANSPOSITION Left 05/21/2018   Procedure: LEFT BASILIC VEIN FISTULA SECOND STAGE;  Surgeon: [name], MD;  Location: [place];  Service: Vascular;  Laterality: Left;  . COLONOSCOPY    . COLONOSCOPY WITH PROPOFOL N/A 11/30/2019   Procedure: COLONOSCOPY WITH PROPOFOL;  Surgeon: Ronnette Juniper, MD;  Location: [place];  Service: Gastroenterology;  Laterality: N/A;  . GLAUCOMA SURGERY  2019   multiple surgeries  . HEMOSTASIS CLIP PLACEMENT  11/30/2019   Procedure: HEMOSTASIS CLIP PLACEMENT;  Surgeon: Ronnette Juniper, MD;  Location: [place];  Service: Gastroenterology;;  . KNEE ARTHROSCOPY    . POLYPECTOMY  11/30/2019   Procedure: POLYPECTOMY;  Surgeon: Ronnette Juniper, MD;  Location: [place];  Service: Gastroenterology;;  . PROSTATE SURGERY    . RIGHT/LEFT HEART CATH AND CORONARY ANGIOGRAPHY N/A 12/03/2019   Procedure: RIGHT/LEFT HEART CATH AND CORONARY ANGIOGRAPHY;  Surgeon: Burnell Blanks, MD;  Location: [facility];  Service: Cardiovascular;  Laterality: N/A;  . SUBMUCOSAL TATTOO INJECTION  11/30/2019   Procedure: SUBMUCOSAL TATTOO INJECTION;  Surgeon: Ronnette Juniper, MD;  Location: [facility] ENDOSCOPY;  Service: Gastroenterology;;   ALLERGIES: No Known Allergies  MEDICATIONS: Current Facility-Administered Medications  Medication Dose Route Frequency Provider Last Rate Last Admin  . acetaminophen (TYLENOL) tablet 650 mg  650 mg Oral Q6H PRN Marcelyn Bruins, MD   650 mg at 12/13/19 2146   Or  . acetaminophen (TYLENOL) suppository 650 mg  650 mg Rectal Q6H PRN Marcelyn Bruins, MD      . apraclonidine (IOPIDINE) 1 % ophthalmic solution 1 drop  1 drop Right Eye TID Marcelyn Bruins, MD      . atorvastatin (LIPITOR) tablet 10 mg  10 mg Oral q1800 Marcelyn Bruins, MD      . carvedilol (COREG) tablet 3.125 mg  3.125 mg Oral BID WC Marcelyn Bruins, MD   3.125 mg at  12/14/19 0857  . diclofenac Sodium (VOLTAREN) 1 % topical gel 4 g  4 g Topical QID PRN Marcelyn Bruins, MD      . dorzolamide-timolol (COSOPT) 22.3-6.8 MG/ML ophthalmic solution 1 drop  1 drop Right Eye BID Marcelyn Bruins, MD      . enoxaparin (LOVENOX) injection 30 mg  30 mg Subcutaneous Q24H Marcelyn Bruins, MD   30 mg at 12/13/19 2147  . ferric citrate (AURYXIA) tablet 420 mg  420 mg Oral TID WC Marcelyn Bruins, MD   420 mg at 12/14/19 1332  . gabapentin (NEURONTIN) capsule 100 mg  100 mg Oral QHS Marcelyn Bruins, MD   100 mg at 12/13/19 2147  . hydrALAZINE (APRESOLINE) tablet 25 mg  25 mg Oral BID Marcelyn Bruins, MD   25 mg at 12/14/19 1331  . hydroxypropyl methylcellulose / hypromellose (ISOPTO TEARS / GONIOVISC) 2.5 % ophthalmic solution 1 drop  1  drop Left Eye PRN Marcelyn Bruins, MD      . insulin aspart (novoLOG) injection 0-6 Units  0-6 Units Subcutaneous TID WC Marcelyn Bruins, MD   1 Units at 12/14/19 1332  . isosorbide mononitrate (IMDUR) 24 hr tablet 30 mg  30 mg Oral Daily Marcelyn Bruins, MD   30 mg at 12/14/19 1331  . latanoprost (XALATAN) 0.005 % ophthalmic solution 1 drop  1 drop Right Eye QHS Marcelyn Bruins, MD      . multivitamin (RENA-VIT) tablet 1 tablet  1 tablet Oral QHS Marcelyn Bruins, MD   1 tablet at 12/14/19 0045  . pantoprazole (PROTONIX) EC tablet 40 mg  40 mg Oral Daily Marcelyn Bruins, MD   40 mg at 12/14/19 1331  . pilocarpine (PILOCAR) 4 % ophthalmic solution 1 drop  1 drop Right Eye TID AC & HS Marcelyn Bruins, MD      . sodium chloride flush (NS) 0.9 % injection 3 mL  3 mL Intravenous Q12H Marcelyn Bruins, MD   3 mL at 12/13/19 2147  . tamsulosin (FLOMAX) capsule 0.4 mg  0.4 mg Oral QHS Marcelyn Bruins, MD   0.4 mg at 12/13/19 2147    LABS: Lab Results  Component Value Date   WBC 5.5 12/14/2019   HGB 10.8 (L) 12/14/2019   HCT 32.9 (L) 12/14/2019   MCV 104.1 (H) 12/14/2019   PLT 231 12/14/2019       Component Value Date/Time   NA 138 12/14/2019 0245   NA 134 (A) 09/11/2019 0000   K 3.9 12/14/2019 0245   CL 97 (L) 12/14/2019 0245   CO2 29 12/14/2019 0245   GLUCOSE 174 (H) 12/14/2019 0245   BUN 24 (H) 12/14/2019 0245   BUN 34 (A) 09/11/2019 0000   CREATININE 6.54 (H) 12/14/2019 0245   CALCIUM 10.1 12/14/2019 0245   GFRNONAA 7 (L) 12/14/2019 0245   GFRAA 7 (L) 12/03/2019 0710   Lab Results  Component Value Date   INR 1.0 11/26/2019   INR 1.11 03/25/2018   No results found for: PTT  SOCIAL HISTORY: Social History   Socioeconomic History  . Marital status: Widowed    Spouse name: Not on file  . Number of children: Not on file  . Years of education: Not on file  . Highest education level: Not on file  Occupational History  . Occupation: retired  Tobacco Use  . Smoking status: Former Smoker    Packs/day: 0.50    Quit date: 03/23/2018    Years since quitting: 1.7  . Smokeless tobacco: Never Used  Vaping Use  . Vaping Use: Never used  Substance and Sexual Activity  . Alcohol use: No  . Drug use: No  . Sexual activity: Not Currently  Other Topics Concern  . Not on file  Social History Narrative  . Not on file   Social Determinants of Health   Financial Resource Strain: Low Risk   . Difficulty of Paying Living Expenses: Not hard at all  Food Insecurity: No Food Insecurity  . Worried About Charity fundraiser in the Last Year: Never true  . Ran Out of Food in the Last Year: Never true  Transportation Needs: No Transportation Needs  . Lack of Transportation (Medical): No  . Lack of Transportation (Non-Medical): No  Physical Activity: Inactive  . Days of Exercise per Week: 0 days  . Minutes of Exercise per Session: 0 min  Stress: No Stress  Concern Present  . Feeling of Stress : Not at all  Social Connections:   . Frequency of Communication with Friends and Family: Not on file  . Frequency of Social Gatherings with Friends and Family: Not on file  .  Attends Religious Services: Not on file  . Active Member of Clubs or Organizations: Not on file  . Attends Archivist Meetings: Not on file  . Marital Status: Not on file  Intimate Partner Violence:   . Fear of Current or Ex-Partner: Not on file  . Emotionally Abused: Not on file  . Physically Abused: Not on file  . Sexually Abused: Not on file   FAMILY HISTORY: Family History  Problem Relation Age of Onset  . CAD Mother      REVIEW OF SYSTEMS: Reviewed with the patient as per HPI. PSYCH: Patient denies having dental phobia.   DENTAL EXAMINATION: GENERAL: The patient is well-appearing and in no apparent distress. HEAD AND NECK:  No lymphadenopathy, edema, erythema or trismus. INTRAORAL EXAM: Soft tissues appear well-perfused.  Good salivary flow noted upon examination.  Minimal alveolar ridge loss in areas of missing teeth, bilateral mandibular tori. DENTITION: Overall poor remaining dentition.  Missing teeth, multiple retained root tips and grossly decayed teeth. Diastema b/w #21 and #22, and #27 and #28. PERIODONTAL: Inflamed, erythematous gingival tissue.  Gingival recession.  Generalized heavy calculus accumulation on mandibular remaining dentition. DENTAL CARIES/SUBOPTIMAL RESTORATIONS: #5, #7- #13 and #17 retained root tips with gross decay.  #4 and #6 caries.  Unable to completely examine mandibular dentition due to calculus accumulation. PROSTHODONTIC: Patient reports never having partials or dentures. OCCLUSION: Class III tendency with collapsed VDO. #4 and #6 supra-erupted and impinging on mandibular alveolar ridge.  RADIOGRAPHIC INTERPRETATION (Orthopanogram): Condyles seated bilaterally in fossas.  No evidence of abnormal pathology.  All visualized osseous structures appear WNL. Generalized mild horizontal bone loss consistent with generalized mild periodontitis. Radiographic calculus accumulation evident. Missing teeth, caries, multiple retained root tips.   #5, #7, #8, #9 and #17 signs of periapical radiolucencies.  #4 and #6 supra-erupted.   ASSESSMENT: 1. Severe AS 2. ESRD 3. Pre-Heart Valve surgery 4. Diabetes mellitus Type II 5. HTN 6. Missing teeth 7. 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 Gingival recession 11. Retained dental root 12. 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 Postoperative bleeding risk   PLAN/RECOMMENDATIONS: 1. I discussed the risks, benefits, and complications of various treatment options with the patient in relationship to his medical and dental conditions. Discussed the perioperative and post-operative risks of extractions under GA, as well as the risk of systemic infection if infected teeth/hopeless teeth are not addressed.  Discussed the importance of establishing care at an outside dental office after his surgery and recovery to maintain his oral health and prevent any future risk of pain/infection. We discussed various treatment options to include no treatment, multiple extractions with alveoloplasty, pre-prosthetic surgery as indicated, periodontal therapy, dental restorations, root canal therapy, crown and bridge therapy, implant therapy, and replacement of missing teeth as indicated. The patient verbalized understanding of all options, and currently wishes to proceed with extractions of indicated teeth and alveoloplasty/pre-prosthetic surgery PRN in the OR under general anesthesia.   2. Will discuss findings with medical team and coordination of future medical and dental care as needed.    All of the patient's questions/concerned were answered and addressed, and he verbalized understanding of the current assessment and plan.   Provider: Juliane Poot. Benson Norway, DMD

## 2019-12-14 NOTE — Progress Notes (Signed)
PROGRESS NOTE Late entry for 10/11--not not singed at close of encounter   Elijah White  HFW:[id number] DOB: 29-May-1940 DOA: 12/13/2019 PCP: [name], MD  Brief Narrative:  40 M community dwelling BM ESRD TTS HTn HLD DM Ty ii GERD L eye blind, HFpEF EF 55% last echo Recently admitted 11/26/2019 through 12/03/2019 with chest pain-found to have severe aortic stenosis to be followed by outpatient cardiology for possible TAVR in addition found to have mid epigastric pain as well as on screening CT?  Mass 2.3 cm-at that admission had multiple polyps removed none of which were [facility] 12/13/2019 with fatigue sleepiness intermittent shortness of breath chest pain Troponin slightly elevated 28/29 cardiology was consulted given possible syncope severe aortic stenosis  Assessment & Plan:   Active Problems:   Type 2 diabetes mellitus with renal manifestations (HCC)   BPH (benign prostatic hyperplasia)   Essential hypertension   GERD (gastroesophageal reflux disease)   ESRD (end stage renal disease) (HCC)   Chest pain   Near syncope   1. Near syncope with severe aortic stenosis, HTN 2. Nonobstructive CAD with diastolic heart failure last echocardiogram 6 PM a. Cardiology consulted and no further recommendations--outpatient TAVR planning will be done by Dr. [name] b. I will get orthostatic vital signs to ensure that he is not orthostatic c. I would keep him 1 more night to ensure no further issues d. Continue Coreg 3.125 twice daily, hydralazine 25 twice daily, Imdur 30 daily e. Continue atorvastatin 10 daily 3. ESRD HD TTS a. Nephrology aware of patient and can plan for dialysis in the outpatient setting 4. BPH a. Continue Flomax 0.4 daily 5. Reflux a. Continue pantoprazole 40 daily 6. Left eye blindness a. Patient is a fall risk because of the same 7. Recent polypectomies a. Stable at this time monitor trends 8. 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 CBGs ranging 1 27-1 74, continue sliding scale only and monitor in the outpatient setting as occasionally becomes hypoglycemic b. Continue gabapentin 100 at bedtime for neuropathy 9. 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  Dispo: The patient is from: Home              Anticipated d/c is to: Home              Anticipated d/c date is: 1 day              Patient currently is medically stable to d/c.       Consultants:   Cardiology nephrology  Procedures: nad  Antimicrobials: n    Subjective: Alert no distress does not feel dizzy  Objective: Vitals:   12/14/19 0200 12/14/19 0230 12/14/19 0300 12/14/19 0648  BP: 96/60 105/63 101/60 138/62  Pulse: 74 86 75 86  Resp: 14 16 15 19   Temp:      TempSrc:      SpO2: 98% 97% 96% 97%  Weight:      Height:       No intake or output data in the 24 hours ending 12/14/19 0744 Filed Weights   12/13/19 1923  Weight: 74.8 kg    Examination:  General exam: EOMI NCAT no focal deficit clear no added sound Respiratory system: Clear no added sound Cardiovascular system: S1-S2 no murmur no  rub no gallop telemetry is benign Gastrointestinal system: Soft nontender no rebound no guarding. Central nervous system: Intact moving all 4 limbs equally no focal deficit Extremities: No lower extremity edema ROM intact major muscle groups Skin: No edema as above Psychiatry:   Data Reviewed: Congruent euthymicI have personally reviewed following labs and imaging studies BUNs/creatinine 23/5.5-->22/6.5 WBC 5.5 Hemoglobin 10.8 Platelet 231    Radiology Studies: DG Chest 2 View  Result Date: 12/13/2019 CLINICAL DATA:  Initial evaluation for acute chest pain and weakness. EXAM: CHEST - 2 VIEW COMPARISON:   Prior radiograph from 11/26/2019. FINDINGS: Transverse heart size within normal limits. Mediastinal silhouette normal. Lungs are normally inflated. No focal infiltrates. No pulmonary edema or pleural effusion. No acute osseous finding. Surgical clips overlie the left axillary region in GE junction. Degenerative changes noted about the shoulders. IMPRESSION: No active cardiopulmonary disease. Electronically Signed   By: Jeannine Boga M.D.   On: 12/13/2019 18:32     Scheduled Meds: . apraclonidine  1 drop Right Eye TID  . atorvastatin  10 mg Oral q1800  . carvedilol  3.125 mg Oral BID WC  . dorzolamide-timolol  1 drop Right Eye BID  . enoxaparin (LOVENOX) injection  30 mg Subcutaneous Q24H  . ferric citrate  420 mg Oral TID WC  . gabapentin  100 mg Oral QHS  . hydrALAZINE  25 mg Oral BID  . insulin aspart  0-6 Units Subcutaneous TID WC  . isosorbide mononitrate  30 mg Oral Daily  . latanoprost  1 drop Right Eye QHS  . multivitamin  1 tablet Oral QHS  . pantoprazole  40 mg Oral Daily  . pilocarpine  1 drop Right Eye TID AC & HS  . sodium chloride flush  3 mL Intravenous Q12H  . tamsulosin  0.4 mg Oral QHS   Continuous Infusions:   LOS: 0 days    Time spent: 25  Nita Sells, MD [facility] To contact the attending provider between 7A-7P or the covering provider during after hours 7P-7A, please log into the web site [email or web address] and access using universal [place] password for that web site. If you do not have the password, please call the hospital operator.  12/14/2019, 7:44 AM

## 2019-12-15 ENCOUNTER — Telehealth (HOSPITAL_COMMUNITY): Payer: Self-pay | Admitting: Dentistry

## 2019-12-15 ENCOUNTER — Ambulatory Visit: Payer: Medicare Other

## 2019-12-15 DIAGNOSIS — E118 Type 2 diabetes mellitus with unspecified complications: Secondary | ICD-10-CM | POA: Diagnosis not present

## 2019-12-15 DIAGNOSIS — N186 End stage renal disease: Secondary | ICD-10-CM

## 2019-12-15 DIAGNOSIS — Z992 Dependence on renal dialysis: Secondary | ICD-10-CM | POA: Diagnosis not present

## 2019-12-15 DIAGNOSIS — E1122 Type 2 diabetes mellitus with diabetic chronic kidney disease: Secondary | ICD-10-CM

## 2019-12-15 DIAGNOSIS — Z23 Encounter for immunization: Secondary | ICD-10-CM | POA: Diagnosis not present

## 2019-12-15 DIAGNOSIS — N2581 Secondary hyperparathyroidism of renal origin: Secondary | ICD-10-CM | POA: Diagnosis not present

## 2019-12-15 DIAGNOSIS — D631 Anemia in chronic kidney disease: Secondary | ICD-10-CM | POA: Diagnosis not present

## 2019-12-15 DIAGNOSIS — R079 Chest pain, unspecified: Secondary | ICD-10-CM | POA: Diagnosis not present

## 2019-12-15 LAB — COMPREHENSIVE METABOLIC PANEL
ALT: 15 U/L (ref 0–44)
AST: 17 U/L (ref 15–41)
Albumin: 3.1 g/dL — ABNORMAL LOW (ref 3.5–5.0)
Alkaline Phosphatase: 79 U/L (ref 38–126)
Anion gap: 12 (ref 5–15)
BUN: 35 mg/dL — ABNORMAL HIGH (ref 8–23)
CO2: 28 mmol/L (ref 22–32)
Calcium: 9.9 mg/dL (ref 8.9–10.3)
Chloride: 97 mmol/L — ABNORMAL LOW (ref 98–111)
Creatinine, Ser: 8.5 mg/dL — ABNORMAL HIGH (ref 0.61–1.24)
GFR, Estimated: 5 mL/min — ABNORMAL LOW (ref >60)
Glucose, Bld: 202 mg/dL — ABNORMAL HIGH (ref 70–99)
Potassium: 4.1 mmol/L (ref 3.5–5.1)
Sodium: 137 mmol/L (ref 135–145)
Total Bilirubin: 0.5 mg/dL (ref 0.3–1.2)
Total Protein: 6.2 g/dL — ABNORMAL LOW (ref 6.5–8.1)

## 2019-12-15 LAB — CBC WITH DIFFERENTIAL/PLATELET
Abs Immature Granulocytes: 0.02 10*3/uL (ref 0.00–0.07)
Basophils Absolute: 0.1 10*3/uL (ref 0.0–0.1)
Basophils Relative: 1 %
Eosinophils Absolute: 0.2 10*3/uL (ref 0.0–0.5)
Eosinophils Relative: 5 %
HCT: 34.3 % — ABNORMAL LOW (ref 39.0–52.0)
Hemoglobin: 10.9 g/dL — ABNORMAL LOW (ref 13.0–17.0)
Immature Granulocytes: 0 %
Lymphocytes Relative: 28 %
Lymphs Abs: 1.5 10*3/uL (ref 0.7–4.0)
MCH: 32.2 pg (ref 26.0–34.0)
MCHC: 31.8 g/dL (ref 30.0–36.0)
MCV: 101.5 fL — ABNORMAL HIGH (ref 80.0–100.0)
Monocytes Absolute: 0.7 10*3/uL (ref 0.1–1.0)
Monocytes Relative: 13 %
Neutro Abs: 2.8 10*3/uL (ref 1.7–7.7)
Neutrophils Relative %: 53 %
Platelets: 222 10*3/uL (ref 150–400)
RBC: 3.38 MIL/uL — ABNORMAL LOW (ref 4.22–5.81)
RDW: 13.1 % (ref 11.5–15.5)
WBC: 5.3 10*3/uL (ref 4.0–10.5)
nRBC: 0 % (ref 0.0–0.2)

## 2019-12-15 LAB — GLUCOSE, CAPILLARY: Glucose-Capillary: 145 mg/dL — ABNORMAL HIGH (ref 70–99)

## 2019-12-15 NOTE — Patient Instructions (Signed)
Social Worker Visit Information  Goals we discussed today:  Goals Addressed            This Visit's Progress   . Collaborate with RN Care Manager to assist with care coordination needs post inpatient stay   On track    [place] (see longitudinal plan of care for additional care plan information)  Current Barriers:  . Level of care concerns . Social Isolation . Recent inpatient stay from 9/23- 9/30 . Chronic conditions including ESRD and DM II which put patient at increased risk for hospitalization  Social Work Clinical Goal(s):  [name] Over the next 90 days the patient and his daughter will work with SW to address care coordination needs  CCM SW Interventions: Completed 12/15/19 with [name] . Successful outbound call placed to the patients daughter in response to a voice message requesting SW assistance . Discussed patient discharged from the hospital this morning in order to attend HD treatment o The patient was at [facility] from 10/10 - 10/12 due to near syncope and ESRD . Determined the patient has not yet received home health services and declined a referral during discharge due to a past referral being placed . Performed chart review to note a referral was placed by the patients primary provider on 10/5 o Discussed barriers to start of care including staff challenges due to [place] pandemic as well as recent inpatient stay o Advised [name] the referral coordinator is actively working this referral . Discussed [name]'s concern surrounding increased exposure risk in the home to [place] 19 by allowing home health in the home o Rosamaria Lints all home health agencies are following COVID protocols and using appropriate PPE o Determined [name] plans to review COVID protocol telephonically prior to consenting to services . Collaboration with RN Care Manager to inform of patient disposition and plan . Collaboration with Dr. Baird Cancer to review [name] concern with allowing home health  services in the home as well as to discuss if the patient may benefit from Remote Health o Determined Dr Baird Cancer agrees with Remote Health referral  Completed 12/07/19 with the patient and his daughter [name] . Inter-disciplinary care team collaboration (see longitudinal plan of care) . Successful outbound call placed to the patients daughter and caregiver [name] to assess for post-discharge needs . Determined the patient did receive a call over the weekend from [facility] to complete a TOC call o During this call the patient did agree to home health services o [name] reports she is unclear if home health had been ordered or to which agency . Performed chart review to note PA documented she would inform inpatient SW and PCP of patient decision to participate in home health . Collaboration with Dr. Baird Cancer to determine if she would be the ordering provider . Joint call completed with [name] and the patient to assess for care coordination needs o Discussed the patient is currently receiving [place] Medicaid and is not interested in placement at this time o [name]'s cousin has agreed to assist with patient care needs by dropping food off multiple times a week and checking on the patient while [name] is at work . Collaboration with RN Care Manager to inform of interventions and plan . Scheduled follow up call over the next two weeks  Patient Self Care Activities: With the help of his daughter . Self administers medications as prescribed . Attends all scheduled provider appointments . Performs ADL's independently . Calls provider office for new concerns or questions  Please see past updates related to this goal by clicking on the "Past Updates" button in the selected goal           Follow Up Plan: SW will follow up with patient by phone over the next two weeks.  [name], BSW, CDP Social Worker, Certified Dementia Practitioner [place] [facility] [place] [facility] [phone]

## 2019-12-15 NOTE — Chronic Care Management (AMB) (Signed)
Chronic Care Management    Social Work Follow Up Note  12/15/2019 Name: MURTAZA SHELL MRN: [id number] DOB: Jun 18, 1940  Elijah White is a 79 y.o. year old male who is a primary care patient of [name], MD. The CCM team was consulted for assistance with care coordination.   Review of patient status, including review of consultants reports, other relevant assessments, and collaboration with appropriate care team members and the patient's provider was performed as part of comprehensive patient evaluation and provision of chronic care management services.    SDOH (Social Determinants of Health) assessments performed: No    Outpatient Encounter Medications as of 12/15/2019  Medication Sig Note   acetaminophen (TYLENOL) 500 MG tablet Take 2 tablets (1,000 mg total) by mouth every 6 (six) hours as needed for mild pain, moderate pain or headache.    apraclonidine (IOPIDINE) 0.5 % ophthalmic solution Place 1 drop into the right eye 3 (three) times daily.  12/13/2019: 2 doses today   atorvastatin (LIPITOR) 10 MG tablet TAKE 1 TABLET BY MOUTH EVERY DAY (Patient taking differently: Take 10 mg by mouth daily. )    B Complex-C-Folic Acid (DIALYVITE 111) 0.8 MG TABS Take 1 tablet by mouth daily.    BD PEN NEEDLE NANO U/F 32G X 4 MM MISC USE SUBCUTANEOUS FOUR TIMES A DAY    blood glucose meter kit and supplies KIT Dispense based on patient and insurance preference. Use up to four times daily as directed. (FOR ICD-9 250.00, 250.01). For QAC - HS accuchecks.    carvedilol (COREG) 3.125 MG tablet TAKE 1 TABLET(3.125 MG) BY MOUTH TWICE DAILY WITH A MEAL (Patient taking differently: Take 3.125 mg by mouth 2 (two) times daily with a meal. )    diclofenac Sodium (VOLTAREN) 1 % GEL Apply 4 g topically 4 (four) times daily as needed. (Patient taking differently: Apply 4 g topically 4 (four) times daily as needed (pain). )    dorzolamide-timolol (COSOPT) 22.3-6.8 MG/ML ophthalmic solution Place 1  drop into the right eye 2 (two) times daily.    esomeprazole (NEXIUM) 40 MG capsule TAKE 1 CAPSULE BY MOUTH EVERY DAY (Patient taking differently: Take 40 mg by mouth every other day. )    ferric citrate (AURYXIA) 1 GM 210 MG(Fe) tablet Take 420 mg by mouth 3 (three) times daily with meals.    gabapentin (NEURONTIN) 100 MG capsule TAKE 1 CAPSULE(100 MG) BY MOUTH AT BEDTIME (Patient taking differently: Take 100 mg by mouth at bedtime. )    hydrALAZINE (APRESOLINE) 25 MG tablet TAKE 1 TABLET(25 MG) BY MOUTH TWICE DAILY (Patient taking differently: Take 25 mg by mouth in the morning and at bedtime. )    hydroxypropyl methylcellulose / hypromellose (ISOPTO TEARS / GONIOVISC) 2.5 % ophthalmic solution Place 1 drop into the left eye as needed for dry eyes.    isosorbide mononitrate (IMDUR) 30 MG 24 hr tablet TAKE 1 TABLET(30 MG) BY MOUTH DAILY (Patient taking differently: Take 30 mg by mouth daily. )    Lancets (ONETOUCH DELICA PLUS [id number]) MISC USE AS DIRECTED UP TO FOUR TIMES A DAY    latanoprost (XALATAN) 0.005 % ophthalmic solution Place 1 drop into the right eye at bedtime.    ONETOUCH VERIO test strip USE AS DIRECTED TO TEST FOUR TIMES A DAY    pilocarpine (PILOCAR) 4 % ophthalmic solution Place 1 drop into the right eye 4 (four) times daily.     tamsulosin (FLOMAX) 0.4 MG CAPS capsule Take 1 capsule (  0.4 mg total) by mouth at bedtime.    [DISCONTINUED] acetaminophen (TYLENOL) suppository 650 mg     [DISCONTINUED] acetaminophen (TYLENOL) tablet 650 mg     [DISCONTINUED] apraclonidine (IOPIDINE) 1 % ophthalmic solution 1 drop     [DISCONTINUED] atorvastatin (LIPITOR) tablet 10 mg     [DISCONTINUED] carvedilol (COREG) tablet 3.125 mg     [DISCONTINUED] diclofenac Sodium (VOLTAREN) 1 % topical gel 4 g     [DISCONTINUED] dorzolamide-timolol (COSOPT) 22.3-6.8 MG/ML ophthalmic solution 1 drop     [DISCONTINUED] enoxaparin (LOVENOX) injection 30 mg     [DISCONTINUED] ferric  citrate (AURYXIA) tablet 420 mg     [DISCONTINUED] gabapentin (NEURONTIN) capsule 100 mg     [DISCONTINUED] hydrALAZINE (APRESOLINE) tablet 25 mg     [DISCONTINUED] hydroxypropyl methylcellulose / hypromellose (ISOPTO TEARS / GONIOVISC) 2.5 % ophthalmic solution 1 drop     [DISCONTINUED] insulin aspart (novoLOG) injection 0-6 Units     [DISCONTINUED] isosorbide mononitrate (IMDUR) 24 hr tablet 30 mg     [DISCONTINUED] latanoprost (XALATAN) 0.005 % ophthalmic solution 1 drop     [DISCONTINUED] multivitamin (RENA-VIT) tablet 1 tablet     [DISCONTINUED] pantoprazole (PROTONIX) EC tablet 40 mg     [DISCONTINUED] pilocarpine (PILOCAR) 4 % ophthalmic solution 1 drop     [DISCONTINUED] sodium chloride flush (NS) 0.9 % injection 3 mL     [DISCONTINUED] tamsulosin (FLOMAX) capsule 0.4 mg     No facility-administered encounter medications on file as of 12/15/2019.     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 which put patient at increased risk for hospitalization  Social Work Clinical Goal(s):   Over the next 90 days the patient and his daughter will work with SW to address care coordination needs  CCM SW Interventions: Completed 12/15/19 with [name]  Successful outbound call placed to the patients daughter in response to a voice message requesting SW assistance  Discussed patient discharged from the hospital this morning in order to attend HD treatment o The patient was at [facility] from 10/10 - 10/12 due to near syncope and ESRD  Determined the patient has not yet received home health services and declined a referral during  discharge due to a past referral being placed  Performed chart review to note a referral was placed by the patients primary provider on 10/5 o Discussed barriers to start of care including staff challenges due to [place] 19 pandemic as well as recent inpatient stay o Advised [name] the referral coordinator is actively working this referral  Discussed [name]'s concern surrounding increased exposure risk in the home to [place] 19 by allowing home health in the home o Rosamaria Lints all home health agencies are following COVID protocols and using appropriate PPE o Determined [name] plans to review COVID protocol telephonically prior to consenting to services  Collaboration with RN Care Manager to inform of patient disposition and plan  Collaboration with Dr. Baird Cancer to review [name] concern with allowing home health services in the home as well as to discuss if the patient may benefit from [place] o Determined  Dr Baird Cancer agrees with [facility] referral  Completed 12/07/19 with the patient and his daughter Marianna Payment care team collaboration (see longitudinal plan of care)  Successful outbound call placed to the patients daughter and caregiver [name] to assess for post-discharge needs  Determined the patient did receive a call over the weekend from [facility] to complete a TOC call o During this call the patient did agree to home health services o [name] reports she is unclear if home health had been ordered or to which agency  Performed chart review to note PA documented she would inform inpatient SW and PCP of patient decision to participate in home health  Collaboration with Dr. Baird Cancer to determine if she would be the ordering provider  Joint call completed with [name] and the patient to assess for care coordination needs o Discussed the patient is currently receiving [employer] Medicaid and is not interested in placement at this time o [name]'s cousin has agreed to  assist with patient care needs by dropping food off multiple times a week and checking on the patient while [name] is at work  Ambulance person with Consulting civil engineer to inform of interventions and plan  Scheduled follow up call over the next two weeks  Patient Self Care Activities: With the help of his daughter  Self administers medications as prescribed  Attends all scheduled provider appointments  Performs ADL's independently  Calls provider office for new concerns or questions  Please see past updates related to this goal by clicking on the "Past Updates" button in the selected goal          Follow Up Plan: SW will follow up with patient by phone over the next two weeks.   [name], BSW, CDP Social Worker, Certified Dementia Practitioner [place] [facility] [place] [facility] [phone]  Total time spent performing care coordination and/or care management activities with the patient by phone or face to face = 15 minutes.

## 2019-12-15 NOTE — Care Management (Addendum)
°  1022 Followed up with patient regarding home health services. Patient confirmed he declined home health last admission and still does not want home health services at this time.     Consult : Ensure HH has been fully arrange following previous discharge, report was that he does not have all services yet.   Per note by prior NCM on 12/03/19 Patient refused home health services.   Will follow up with patient.   Magdalen Spatz RN

## 2019-12-15 NOTE — Discharge Summary (Signed)
Physician Discharge Summary  Elijah White TMH:[id number] DOB: February 16, 1941 DOA: 12/13/2019  PCP: [name], MD  Admit date: 12/13/2019 Discharge date: 12/15/2019  Time spent: 20 minutes  Recommendations for Outpatient Follow-up:  1. Will need outpatient coordination with cardiology team with regards to TAVR in the outpatient setting 2. Continue regular scheduled TTS as per nephrology 3. Have recommended home health follow-up in the outpatient setting 4. Will need outpatient characterization of adrenal mass as an outpatient as per PCP and coordination of the same  Discharge Diagnoses:  Active Problems:   Type 2 diabetes mellitus with renal manifestations (HCC)   BPH (benign prostatic hyperplasia)   Essential hypertension   GERD (gastroesophageal reflux disease)   ESRD (end stage renal disease) ([place])   Chest pain   Near syncope   Discharge Condition: Improved  Diet recommendation: Heart healthy  Filed Weights   12/13/19 1923 12/15/19 0609  Weight: 74.8 kg 75.9 kg    History of present illness:  79 M community dwelling BM ESRD TTS HTn HLD DM Ty ii GERD L eye blind, HFpEF EF 55% last echo Recently admitted 11/26/2019 through 12/03/2019 with chest pain-found to have severe aortic stenosis to be followed by outpatient cardiology for possible TAVR in addition found to have mid epigastric pain as well as on screening CT?  Mass 2.3 cm-at that admission had multiple polyps removed none of which were [facility] 12/13/2019 with fatigue sleepiness intermittent shortness of breath chest pain Troponin slightly elevated 28/29 cardiology was consulted given possible syncope severe aortic stenosis  Hospital Course:  1. Near syncope with severe aortic stenosis, HTN 2. Nonobstructive CAD with diastolic heart failure last echocardiogram 6 PM a. Cardiology consulted and no further recommendations--outpatient TAVR planning will be done by Dr. [name] b. I  orthostatics were not really positive c. As an outpatient patient will continue continue Coreg 3.125 twice daily, hydralazine 25 twice daily, Imdur 30 daily d. Continue atorvastatin 10 daily 3. ESRD HD TTS a. Nephrology had not been aware-when I saw him I thought that he was taken to dialysis first from the emergency room  b. -I have contacted Dr. [name] prior to discharge to ensure we can get him treatment today prior to discharge home 4. BPH a. Continue Flomax 0.4 daily 5. Reflux a. Continue pantoprazole 40 daily 6. Left eye blindness a. Patient is a fall risk because of the same 7. Recent polypectomies a. Stable at this time monitor trends 8. 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 CBGs ranging 1 27-1 74, continue sliding scale only and monitor in the outpatient setting as occasionally becomes hypoglycemic b. Continue gabapentin 100 at bedtime for neuropathy 9. 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   Discharge Exam: Vitals:   12/14/19 2109 12/14/19 2357  BP: 132/66 132/66  Pulse: 82 85  Resp: 18 20  Temp: 98.4 F (36.9 C) 98 F (36.7 C)  SpO2: 98% 100%    General: Awake alert coherent no distress EOMI NCAT no focal deficit poor vision good Cardiovascular: S1-S2 no murmur rub or gallop no rales no rhonchi Respiratory: Clear no added sound Abdomen soft no rebound no guarding Lower extremities soft no swelling no rash no lower extremity edema  Discharge Instructions   Discharge Instructions    Diet - low sodium heart healthy   Complete by: As directed    Discharge instructions   Complete by: As directed    Monitor for Shortness of breath or lethargy and passing out  spells Follow up with cardiology as an OP for further planning regarding the valve replacement that you have been counseled on and you and your dentist need to be in touch to coordinate tooth extractions etc.  You will need to follow-up with your primary physician in several weeks but get labs in  about a week and then report to them   Increase activity slowly   Complete by: As directed      Allergies as of 12/15/2019   No Known Allergies     Medication List    TAKE these medications   acetaminophen 500 MG tablet Commonly known as: TYLENOL Take 2 tablets (1,000 mg total) by mouth every 6 (six) hours as needed for mild pain, moderate pain or headache.   apraclonidine 0.5 % ophthalmic solution Commonly known as: IOPIDINE Place 1 drop into the right eye 3 (three) times daily.   atorvastatin 10 MG tablet Commonly known as: LIPITOR TAKE 1 TABLET BY MOUTH EVERY DAY   Auryxia 1 GM 210 MG(Fe) tablet Generic drug: ferric citrate Take 420 mg by mouth 3 (three) times daily with meals.   BD Pen Needle Nano U/F 32G X 4 MM Misc Generic drug: Insulin Pen Needle USE SUBCUTANEOUS FOUR TIMES A DAY   blood glucose meter kit and supplies Kit Dispense based on patient and insurance preference. Use up to four times daily as directed. (FOR ICD-9 250.00, 250.01). For QAC - HS accuchecks.   carvedilol 3.125 MG tablet Commonly known as: COREG TAKE 1 TABLET(3.125 MG) BY MOUTH TWICE DAILY WITH A MEAL What changed: See the new instructions.   Dialyvite 800 0.8 MG Tabs Take 1 tablet by mouth daily.   diclofenac Sodium 1 % Gel Commonly known as: VOLTAREN Apply 4 g topically 4 (four) times daily as needed. What changed: reasons to take this   dorzolamide-timolol 22.3-6.8 MG/ML ophthalmic solution Commonly known as: COSOPT Place 1 drop into the right eye 2 (two) times daily.   esomeprazole 40 MG capsule Commonly known as: NEXIUM TAKE 1 CAPSULE BY MOUTH EVERY DAY What changed:   how much to take  when to take this   gabapentin 100 MG capsule Commonly known as: NEURONTIN TAKE 1 CAPSULE(100 MG) BY MOUTH AT BEDTIME What changed: See the new instructions.   hydrALAZINE 25 MG tablet Commonly known as: APRESOLINE TAKE 1 TABLET(25 MG) BY MOUTH TWICE DAILY What changed: See the new  instructions.   hydroxypropyl methylcellulose / hypromellose 2.5 % ophthalmic solution Commonly known as: ISOPTO TEARS / GONIOVISC Place 1 drop into the left eye as needed for dry eyes.   isosorbide mononitrate 30 MG 24 hr tablet Commonly known as: IMDUR TAKE 1 TABLET(30 MG) BY MOUTH DAILY What changed: See the new instructions.   latanoprost 0.005 % ophthalmic solution Commonly known as: XALATAN Place 1 drop into the right eye at bedtime.   OneTouch Delica Plus [id number] Misc USE AS DIRECTED UP TO FOUR TIMES A DAY   OneTouch Verio test strip Generic drug: glucose blood USE AS DIRECTED TO TEST FOUR TIMES A DAY   pilocarpine 4 % ophthalmic solution Commonly known as: PILOCAR Place 1 drop into the right eye 4 (four) times daily.   tamsulosin 0.4 MG Caps capsule Commonly known as: FLOMAX Take 1 capsule (0.4 mg total) by mouth at bedtime.      No Known Allergies    The results of significant diagnostics from this hospitalization (including imaging, microbiology, ancillary and laboratory) are listed below for reference.  Significant Diagnostic Studies: DG Orthopantogram  Result Date: 12/02/2019 CLINICAL DATA:  Poor dentition. EXAM: ORTHOPANTOGRAM/PANORAMIC COMPARISON:  None. FINDINGS: No fracture or dislocation is noted. Extremely poor dentition is noted with only a few teeth left in the mandible. No definite lytic destruction is noted. IMPRESSION: Extremely poor dentition. No definite lytic destruction is noted. Electronically Signed   By: Marijo Conception M.D.   On: 12/02/2019 11:31   DG Chest 2 View  Result Date: 12/13/2019 CLINICAL DATA:  Initial evaluation for acute chest pain and weakness. EXAM: CHEST - 2 VIEW COMPARISON:  Prior radiograph from 11/26/2019. FINDINGS: Transverse heart size within normal limits. Mediastinal silhouette normal. Lungs are normally inflated. No focal infiltrates. No pulmonary edema or pleural effusion. No acute osseous finding. Surgical clips  overlie the left axillary region in GE junction. Degenerative changes noted about the shoulders. IMPRESSION: No active cardiopulmonary disease. Electronically Signed   By: Jeannine Boga M.D.   On: 12/13/2019 18:32   DG Shoulder Right  Result Date: 11/20/2019 CLINICAL DATA:  Fall, right shoulder pain EXAM: RIGHT SHOULDER - 2+ VIEW COMPARISON:  None. FINDINGS: Three view radiograph right shoulder demonstrates normal alignment. No fracture or dislocation. Mild acromioclavicular and glenohumeral degenerative arthritis with asymmetric joint space narrowing and subtle osteophyte formation noted. Limited evaluation of the right apex is unremarkable. IMPRESSION: No acute fracture or dislocation. Electronically Signed   By: Fidela Salisbury MD   On: 11/20/2019 20:53   CT Head Wo Contrast  Result Date: 11/26/2019 CLINICAL DATA:  Mental status change EXAM: CT HEAD WITHOUT CONTRAST TECHNIQUE: Contiguous axial images were obtained from the base of the skull through the vertex without intravenous contrast. COMPARISON:  CT head 10/20/2017 FINDINGS: Brain: Cerebral ventricle sizes are concordant with the degree of cerebral volume loss. Patchy and confluent areas of decreased attenuation are noted throughout the deep and periventricular white matter of the cerebral hemispheres bilaterally, compatible with chronic microvascular ischemic disease. No evidence of large-territorial acute infarction. No parenchymal hemorrhage. No mass lesion. No extra-axial collection. No mass effect or midline shift. No hydrocephalus. Basilar cisterns are patent. Vascular: No hyperdense vessel or unexpected calcification. Skull: Interval increase in size of a 5.7 x 2.5 cm fluid density lesion within the subcutaneus soft tissues of the occipital scalp. The lesion has slightly thick walls and persistent slight heterogeneity within. This finding likely represents sebaceous cyst. Negative for fracture or focal lesion. Sinuses/Orbits: Paranasal  sinuses and mastoid air cells are clear. Status post bilateral lens replacement. Redemonstration of left glaucoma drainage device. Similar-appearing 1.2 cm fat density lesion lateral to the right orbit anterior to the zygomatic bone that may be surgical versus a benign finding (4:12). Otherwise the orbits are unremarkable. Other: Atherosclerotic calcifications are present within the cavernous internal carotid arteries and bilateral vertebral arteries. IMPRESSION: No acute intracranial abnormality. Electronically Signed   By: Iven Finn M.D.   On: 11/26/2019 15:34   CARDIAC CATHETERIZATION  Result Date: 12/03/2019  Prox RCA to Mid RCA lesion is 20% stenosed.  Dist RCA lesion is 20% stenosed.  RPAV lesion is 40% stenosed.  Mid Cx lesion is 20% stenosed.  Prox LAD lesion is 30% stenosed.  1. Mild non-obstructive CAD 2. Severe aortic stenosis by echo (cath mean gradient 26.8 mmHg0 Recommendations: will continue workup for TAVR. He has had his scans. He can likely be discharged home later today and we will complete his TAVR workup as an outpatient.   US RENAL  Result Date: 11/30/2019 CLINICAL DATA:  Right-sided  hydronephrosis seen incidentally on a recent prior right upper quadrant ultrasound. EXAM: RENAL / URINARY TRACT ULTRASOUND COMPLETE COMPARISON:  Right upper quadrant ultrasound 11/26/2019; prior renal ultrasound 11/10/2013 FINDINGS: Right Kidney: Renal measurements: 10.1 x 4.9 x 3.8 cm = volume: 97.6 mL. Interval resolution of hydronephrosis. Mild diffuse renal cortical thinning with lipomatous hypertrophy of the renal pelvis. The renal parenchyma is mildly echogenic. No solid lesion identified. Left Kidney: Renal measurements: 9.2 x 4.0 x 4.2 cm = volume: 77.3 mL. No evidence of hydronephrosis. Mild diffuse renal cortical thinning with lipomatous hypertrophy of the renal sinus fat. The renal parenchyma is mildly echogenic. No focal solid lesion identified. Bladder: Foley catheter visualized  within the collapsed bladder. Other: None. IMPRESSION: 1. Foley catheter present within the bladder. 2. Interval resolution of hydronephrosis, presumably related to relief of bladder outlet obstruction. 3. Mildly echogenic kidneys bilaterally consistent with underlying medical renal disease. 4. Mild renal cortical thinning bilaterally. Electronically Signed   By: Malachy Moan M.D.   On: 11/30/2019 10:57   CT CORONARY MORPH W/CTA COR W/SCORE W/CA W/CM &/OR WO/CM  Addendum Date: 12/02/2019   ADDENDUM REPORT: 12/02/2019 13:26 EXAM: OVER-READ INTERPRETATION  CT CHEST The following report is an over-read performed by radiologist Dr. Lesia Hausen [facility] on 12/02/2019. This over-read does not include interpretation of cardiac or coronary anatomy or pathology. The coronary CTA interpretation by the cardiologist is attached. COMPARISON:  11/26/2019 chest CT angiogram. FINDINGS: Please see the separate concurrent chest CT angiogram report for details. IMPRESSION: Please see the separate concurrent chest CT angiogram report for details. Electronically Signed   By: Delbert Phenix M.D.   On: 12/02/2019 13:26   Result Date: 12/02/2019 CLINICAL DATA:  Severe Aortic Stenosis. EXAM: Cardiac TAVR CT TECHNIQUE: The patient was scanned on a [employer]. A 120 kV retrospective scan was triggered in the descending thoracic aorta at 111 HU's. Gantry rotation speed was 250 msecs and collimation was .6 mm. No beta blockade or nitro were given. The 3D data set was reconstructed in 5% intervals of the R-R cycle. Systolic and diastolic phases were analyzed on a dedicated work station using MPR, MIP and VRT modes. The patient received 80 cc of contrast. FINDINGS: Image quality: Excellent. Noise artifact is: Limited. Valve Morphology: The aortic valve is tricuspid. The leaflets are severely calcified and severely thickened. Leaflet calcification appears diffuse. The leaflets exhibit severely restricted  movement in systole Aortic Valve Calcium score: 2336 Aortic annular dimension: Phase assessed: 20% Annular area: 462 mm2 Annular perimeter: 77.3 mm Max diameter: 26.1 mm Min diameter: 22.9 mm Annular and subannular calcification: None. Optimal coplanar projection: LAO 1 CRA 3 Coronary Artery Height above Annulus: Left Main: 15.9 mm Right Coronary: 16.3 mm Sinus of Valsalva Measurements: Non-coronary: 34 mm Right-coronary: 33 mm Left-coronary: 34 mm Sinus of Valsalva Height: Non-coronary: 26.0 mm Right-coronary: 23.2 mm Left-coronary: 22.5 mm Sinotubular Junction: 27 mm with mild to moderate calcifications. Ascending Thoracic Aorta: 31 mm with diffuse mixed density atherosclerosis noted. Coronary Arteries: CAC score of 802, which is 80th percentile for age- and sex-matched controls. Normal coronary origin. Right dominance. The study was performed without use of NTG and is insufficient for plaque evaluation. The following assessment was made nonetheless: Left main: The left main is a large caliber vessel with a normal take off from the left coronary cusp that trifurcates into a LAD, LCX, and ramus intermedius. There is minimal mixed density plaque (<25%). Left anterior descending artery: The proximal LAD contains mild  mixed density plaque (25-49%). The mid LAD contains minimal calcified plaque (<25%). The distal LAD contains minimal non-calcified plaque (<25%). Ramus intermedius: Large vessel with minimal calcified plaque (<25%). Left circumflex artery: The LCX is non-dominant with minimal non-calcified plaque (<25%). Right coronary artery: Dominant with normal take off from the [place]. The proximal and mid RCA segments contain minimal calcified plaque (<25%). The distal RCA contains minimal non-calcified plaque. The RCA terminates as a patent PDA and PLV branch. Cardiac Morphology: Right Atrium: Right atrial size is within normal limits. Right Ventricle: The right ventricular cavity is within normal limits. Left Atrium:  Left atrial size is normal in size with no left atrial appendage filling defect. Left Ventricle: The ventricular cavity size is within normal limits. There are no stigmata of prior infarction. There is no abnormal filling defect. Normal left ventricular function, LVEF=72%. No regional wall motion abnormalities. Pulmonary arteries: Normal in size without proximal filling defect. Pulmonary veins: Normal pulmonary venous drainage. Pericardium: Normal thickness with no significant effusion or calcium present. Mitral Valve: The mitral valve is normal structure with mild mitral annular calcification. Extra-cardiac findings: See attached radiology report for non-cardiac structures. IMPRESSION: 1. 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 2. Annular measurements appropriate for 26 mm Edwards Sapien 3 TAVR 3. No significant annular or subannular calcifications. 4. Sufficient coronary to annulus distance. 5. Optimal Fluoroscopic Angle for Delivery: LAO 1 CRA 3 6. Coronary calcium score of 802 which is 80th percentile for age- and sex-matched controls. 7. 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 [place] T. Audie Box, MD Electronically Signed: By: Eleonore Chiquito On: 12/01/2019 22:06   DG Chest Port 1 View  Result Date: 11/26/2019 CLINICAL DATA:  Chest pain. EXAM: PORTABLE CHEST 1 VIEW COMPARISON:  03/25/2018 FINDINGS: Mild enlargement of the cardiac silhouette, similar to prior. Pulmonary vascular congestion. No evidence of pulmonary edema. No consolidation. No visible pleural effusions. No pneumothorax. The visualized skeletal structures are unremarkable. IMPRESSION: 1. Pulmonary vascular congestion without evidence of pulmonary edema. 2. Similar mild cardiomegaly. Electronically Signed   By: Margaretha Sheffield MD   On: 11/26/2019 11:50   CT ANGIO CHEST AORTA W/CM & OR  WO/CM  Result Date: 12/02/2019 CLINICAL DATA:  Inpatient. Severe aortic stenosis. Pre-TAVR planning. EXAM: CT ANGIOGRAPHY CHEST, ABDOMEN AND PELVIS TECHNIQUE: Multidetector CT imaging through the chest, abdomen and pelvis was performed using the standard protocol during bolus administration of intravenous contrast. Multiplanar reconstructed images and MIPs were obtained and reviewed to evaluate the vascular anatomy. CONTRAST:  43mL OMNIPAQUE IOHEXOL 350 MG/ML SOLN COMPARISON:  11/26/2019 CT angiogram of the chest, abdomen and pelvis. FINDINGS: CTA CHEST FINDINGS Cardiovascular: Normal heart size. No significant pericardial effusion/thickening. Diffuse thickening and coarse calcification of the aortic valve. Three-vessel coronary atherosclerosis. Atherosclerotic nonaneurysmal thoracic aorta. Normal caliber pulmonary arteries. No central pulmonary emboli. Mediastinum/Nodes: Scattered subcentimeter hypodense bilateral thyroid nodules. Not clinically significant; no follow-up imaging recommended (ref: J Am Coll Radiol. 2015 Feb;12(2): 143-50). Unremarkable esophagus. No pathologically enlarged axillary, mediastinal or hilar lymph nodes. Lungs/Pleura: No pneumothorax. No pleural effusion. No acute consolidative airspace disease or lung masses. Anterior left upper lobe tiny 3 mm solid pulmonary nodule (series 5/image 46), stable since recent chest CT. No additional significant pulmonary nodules. Musculoskeletal: No aggressive appearing focal osseous lesions. Moderate thoracic spondylosis. Symmetric moderate bilateral gynecomastia, stable. CTA ABDOMEN AND PELVIS FINDINGS Hepatobiliary: Normal liver with  no liver mass. Stable distended gallbladder with layering subcentimeter calcified gallstones. No gallbladder wall thickening or pericholecystic fluid. No biliary ductal dilatation. Pancreas: Normal, with no mass or duct dilation. Spleen: Normal size. No mass. Adrenals/Urinary Tract: Left adrenal 2.6 cm nodule with density  80 HU and right adrenal 1.7 cm nodule with density 57 HU, both characterized as benign adenomas on recent noncontrast chest CT. No overt hydronephrosis. No contour deforming renal masses. Normal nondistended bladder. Stomach/Bowel: Normal non-distended stomach. Normal caliber small bowel with no small bowel wall thickening. Normal appendix. Normal large bowel with no diverticulosis, large bowel wall thickening or pericolonic fat stranding. Vascular/Lymphatic: Atherosclerotic nonaneurysmal abdominal aorta. No pathologically enlarged lymph nodes in the abdomen or pelvis. Reproductive: Mild prostatomegaly. Brachytherapy seeds scattered throughout the prostate. Other: No pneumoperitoneum, ascites or focal fluid collection. Small fat containing left inguinal hernia. Musculoskeletal: No aggressive appearing focal osseous lesions. Mild lumbar spondylosis. VASCULAR MEASUREMENTS PERTINENT TO TAVR: AORTA: Minimal Aortic Diameter-14.8 x 13.9 mm Severity of Aortic Calcification-moderate to severe RIGHT PELVIS: Right Common Iliac Artery - Minimal Diameter-10.2 x 6.0 mm Tortuosity-mild Calcification-severe Right External Iliac Artery - Minimal Diameter-5.5 x 4.6 mm Tortuosity-mild-to-moderate Calcification-severe Right Common Femoral Artery - Minimal Diameter-7.3 x 6.7 mm Tortuosity-mild Calcification-moderate LEFT PELVIS: Left Common Iliac Artery - Minimal Diameter-9.3 x 8.2 mm Tortuosity-mild Calcification-severe Left External Iliac Artery - Minimal Diameter-7.1 x 5.1 mm Tortuosity-mild mild Calcification-severe Left Common Femoral Artery - Minimal Diameter-8.3 x 7.0 mm Tortuosity-mild Calcification-moderate Review of the MIP images confirms the above findings. IMPRESSION: 1. Vascular findings and measurements pertinent to potential TAVR procedure, as detailed. 2. Diffuse thickening and coarse calcification of the aortic valve, compatible with the reported history of severe aortic stenosis. 3. Three-vessel coronary  atherosclerosis. 4. Tiny 3 mm solid left upper lobe pulmonary nodule. No follow-up needed if patient is low-risk. Non-contrast chest CT can be considered in 12 months if patient is high-risk. This recommendation follows the consensus statement: Guidelines for Management of Incidental Pulmonary Nodules Detected on CT Images: From the Fleischner Society 2017; Radiology 2017; 284:228-243. 5. Cholelithiasis. 6. Bilateral adrenal adenomas. 7. Mild prostatomegaly. 8. Small fat containing left inguinal hernia. 9. Aortic Atherosclerosis (ICD10-I70.0). Electronically Signed   By: Ilona Sorrel M.D.   On: 12/02/2019 13:55   ECHOCARDIOGRAM COMPLETE  Result Date: 11/29/2019    ECHOCARDIOGRAM REPORT   Patient Name:   KALADIN NOSEWORTHY Date of Exam: 11/28/2019 Medical Rec #:  [id number]       Height:       60.0 in Accession #:    [id number]      Weight:       162.5 lb Date of Birth:  01/16/1941       BSA:          1.709 m Patient Age:    70 years        BP:           155/71 mmHg Patient Gender: M               HR:           76 bpm. Exam Location:  Inpatient Procedure: 2D Echo Indications:    chest pain 786.50  History:        Patient has prior history of Echocardiogram examinations, most                 recent 09/02/2017. End stage renal disease, Aortic Valve Disease;  Risk Factors:Diabetes and Dyslipidemia.  Sonographer:    Johny Chess Referring Phys: [id number] [address] Mung[address] TAYLOR  Sonographer Comments: Image acquisition challenging due to respiratory motion. IMPRESSIONS  1. Left ventricular ejection fraction, by estimation, is 65 to 70%. The left ventricle has normal function. The left ventricle has no regional wall motion abnormalities. Left ventricular diastolic parameters are indeterminate.  2. Right ventricular systolic function is normal. The right ventricular size is normal. Tricuspid regurgitation signal is inadequate for assessing PA pressure.  3. The mitral valve is degenerative. Trivial mitral valve  regurgitation. No evidence of mitral stenosis. Severe mitral annular calcification.  4. The aortic valve is calcified. There is severe calcifcation of the aortic valve. There is severe thickening of the aortic valve. Aortic valve regurgitation is trivial. 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  5. The inferior vena cava is normal in size with greater than 50% respiratory variability, suggesting right atrial pressure of 3 mmHg. FINDINGS  Left Ventricle: Left ventricular ejection fraction, by estimation, is 65 to 70%. The left ventricle has normal function. The left ventricle has no regional wall motion abnormalities. The left ventricular internal cavity size was normal in size. There is  no left ventricular hypertrophy. Left ventricular diastolic parameters are indeterminate. Right Ventricle: The right ventricular size is normal. No increase in right ventricular wall thickness. Right ventricular systolic function is normal. Tricuspid regurgitation signal is inadequate for assessing PA pressure. Left Atrium: Left atrial size was normal in size. Right Atrium: Right atrial size was normal in size. Pericardium: There is no evidence of pericardial effusion. Mitral Valve: The mitral valve is degenerative in appearance. Severe mitral annular calcification. Trivial mitral valve regurgitation. No evidence of mitral valve stenosis. Tricuspid Valve: The tricuspid valve is grossly normal. Tricuspid valve regurgitation is not demonstrated. Aortic Valve: The aortic valve is calcified. There is severe calcifcation of the aortic valve. There is severe thickening of the aortic valve. Aortic valve regurgitation is trivial. Moderate to severe aortic stenosis is present. Aortic valve mean gradient measures 26.0 mmHg. Aortic valve peak gradient measures 54.8 mmHg. Aortic valve area, by VTI measures 1.06 cm. Pulmonic Valve: The pulmonic valve was not well visualized. Pulmonic valve regurgitation is not visualized. Aorta: The aortic  root is normal in size and structure. Venous: The inferior vena cava is normal in size with greater than 50% respiratory variability, suggesting right atrial pressure of 3 mmHg. IAS/Shunts: No atrial level shunt detected by color flow Doppler.  LEFT VENTRICLE PLAX 2D LVIDd:         3.90 cm  Diastology LVIDs:         2.35 cm  LV e' medial:    4.57 cm/s LV PW:         1.00 cm  LV E/e' medial:  15.5 LV IVS:        0.90 cm  LV e' lateral:   8.05 cm/s LVOT diam:     2.08 cm  LV E/e' lateral: 8.8 LV SV:         76 LV SV Index:   44 LVOT Area:     3.40 cm  RIGHT VENTRICLE             IVC RV S prime:     11.00 cm/s  IVC diam: 1.25 cm TAPSE (M-mode): 2.0 cm LEFT ATRIUM             Index       RIGHT ATRIUM  Index LA diam:        3.40 cm 1.99 cm/m  RA Area:     9.48 cm LA Vol (A2C):   53.7 ml 31.42 ml/m RA Volume:   18.10 ml 10.59 ml/m LA Vol (A4C):   30.6 ml 17.91 ml/m LA Biplane Vol: 41.8 ml 24.46 ml/m  AORTIC VALVE AV Area (Vmax):    0.89 cm AV Area (Vmean):   0.97 cm AV Area (VTI):     1.06 cm AV Vmax:           370.00 cm/s AV Vmean:          231.000 cm/s AV VTI:            0.716 m AV Peak Grad:      54.8 mmHg AV Mean Grad:      26.0 mmHg LVOT Vmax:         96.71 cm/s LVOT Vmean:        66.068 cm/s LVOT VTI:          0.223 m LVOT/AV VTI ratio: 0.31  AORTA Ao Root diam: 3.30 cm MITRAL VALVE MV Area (PHT): 1.94 cm     SHUNTS MV Decel Time: 391 msec     Systemic VTI:  0.22 m MV E velocity: 71.00 cm/s   Systemic Diam: 2.08 cm MV A velocity: 140.00 cm/s MV E/A ratio:  0.51 Mihai Croitoru MD Electronically signed by Sanda Klein MD Signature Date/Time: 11/29/2019/8:09:10 AM    Final    CT Angio Chest/Abd/Pel for Dissection W and/or W/WO  Result Date: 11/26/2019 CLINICAL DATA:  Chest and back pain EXAM: CT ANGIOGRAPHY CHEST, ABDOMEN AND PELVIS TECHNIQUE: Non-contrast CT of the chest was initially obtained. Multidetector CT imaging through the chest, abdomen and pelvis was performed using the standard  protocol during bolus administration of intravenous contrast. Multiplanar reconstructed images and MIPs were obtained and reviewed to evaluate the vascular anatomy. CONTRAST:  118mL OMNIPAQUE IOHEXOL 350 MG/ML SOLN COMPARISON:  11/26/2019 FINDINGS: CTA CHEST FINDINGS Cardiovascular: The thoracic aorta is unremarkable without aneurysm or dissection. Moderate atherosclerosis of the aortic arch and at the origin of the great vessels. The heart is unremarkable without pericardial effusion. Calcification of the mitral and aortic valves are noted. Moderate atherosclerosis of the coronary vessels. While not optimized for opacification of the pulmonary vasculature, there is sufficient contrast enhancement to exclude pulmonary emboli. Mediastinum/Nodes: No enlarged mediastinal, hilar, or axillary lymph nodes. Thyroid gland, trachea, and esophagus demonstrate no significant findings. Lungs/Pleura: No acute airspace disease, effusion, or pneumothorax. The central airways are patent. Musculoskeletal: No acute or destructive bony lesions. Reconstructed images demonstrate no additional findings. Review of the MIP images confirms the above findings. CTA ABDOMEN AND PELVIS FINDINGS VASCULAR Aorta: Normal caliber aorta without aneurysm, dissection, vasculitis or significant stenosis. Celiac: Patent without evidence of aneurysm, dissection, vasculitis or significant stenosis. SMA: Patent without evidence of aneurysm, dissection, vasculitis or significant stenosis. Renals: There is severe atherosclerosis of the bilateral renal arteries, left greater than right. Multifocal high-grade stenosis at the origin of the left renal artery. IMA: Patent without evidence of aneurysm, dissection, vasculitis or significant stenosis. Inflow: Extensive atherosclerosis without high-grade stenosis. Veins: No obvious venous abnormality within the limitations of this arterial phase study. Review of the MIP images confirms the above findings. NON-VASCULAR  Hepatobiliary: The gallbladder is markedly distended with numerous small calcified gallstones layering dependently. No gallbladder wall thickening or surrounding inflammation to suggest cholecystitis. The liver is unremarkable. No biliary dilation. Pancreas: Unremarkable. No  pancreatic ductal dilatation or surrounding inflammatory changes. Spleen: Normal in size without focal abnormality. Adrenals/Urinary Tract: 2.7 cm indeterminate left adrenal mass. Right adrenals wrist unremarkable. There is bilateral renal cortical thinning and scarring. Otherwise normal enhancement. There is distension of the bilateral renal pelves and bilateral ureters, without clear cause for obstruction. This could be sequela of chronic bladder outlet obstruction. No filling defects within the urinary bladder. Stomach/Bowel: No bowel obstruction or ileus. There is an enhancing 2.3 cm mass within the lumen of the mid transverse colon, reference axial image 97 and coronal image 36, concerning for neoplasm. Colonoscopy recommended. The bladder is unremarkable. No bowel wall thickening or inflammatory change. Lymphatic: No pathologic adenopathy. Reproductive: Radiotherapy seeds are seen within the prostate. Prostate is mildly enlarged. Other: No free fluid or free gas. No abdominal wall hernia. Musculoskeletal: No acute or destructive bony lesions. Reconstructed images demonstrate no additional findings. Review of the MIP images confirms the above findings. IMPRESSION: 1. No evidence of thoracic or abdominal aortic aneurysm or dissection. No evidence of pulmonary embolus. 2. A 2.3 cm enhancing mass within the lumen of the mid transverse colon, concerning for neoplasm. Colonoscopy recommended for further evaluation. 3. Markedly distended gallbladder with numerous small calcified gallstones layering dependently. No evidence of cholecystitis. 4. 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 If further  evaluation is warranted, nonemergent MRI could be performed. 5. Fullness of the bilateral renal pelves and ureters, without clear cause for obstruction. This could be sequela of chronic bladder outlet obstruction given the enlarged prostate. 6. Aortic Atherosclerosis (ICD10-I70.0). Electronically Signed   By: Randa Ngo M.D.   On: 11/26/2019 15:34   VAS US CAROTID  Result Date: 12/03/2019 Carotid Arterial Duplex Study Indications:       Pre-TAVR. Risk Factors:      Hypertension, hyperlipidemia. Comparison Study:  No prior studies. Performing Technologist: [facility] RVT  Examination Guidelines: A complete evaluation includes B-mode imaging, spectral Doppler, color Doppler, and power Doppler as needed of all accessible portions of each vessel. Bilateral testing is considered an integral part of a complete examination. Limited examinations for reoccurring indications may be performed as noted.  Right Carotid Findings: +----------+--------+--------+--------+-----------------------+--------+           PSV cm/sEDV cm/sStenosisPlaque Description     Comments +----------+--------+--------+--------+-----------------------+--------+ CCA Prox  170     14              smooth and heterogenous         +----------+--------+--------+--------+-----------------------+--------+ CCA Distal97      12              smooth and heterogenous         +----------+--------+--------+--------+-----------------------+--------+ ICA Prox  37      7               smooth and heterogenous         +----------+--------+--------+--------+-----------------------+--------+ ICA Distal50      12                                     tortuous +----------+--------+--------+--------+-----------------------+--------+ ECA       77      7                                               +----------+--------+--------+--------+-----------------------+--------+  +----------+--------+-------+--------+-------------------+  PSV cm/sEDV cmsDescribeArm Pressure (mmHG) +----------+--------+-------+--------+-------------------+ [id number]                                        +----------+--------+-------+--------+-------------------+ +---------+--------+--+--------+-+---------+ VertebralPSV cm/s38EDV cm/s8Antegrade +---------+--------+--+--------+-+---------+  Left Carotid Findings: +----------+--------+--------+--------+-----------------------+--------+           PSV cm/sEDV cm/sStenosisPlaque Description     Comments +----------+--------+--------+--------+-----------------------+--------+ CCA Prox  161     22              smooth and heterogenous         +----------+--------+--------+--------+-----------------------+--------+ CCA Distal77      15              smooth and heterogenous         +----------+--------+--------+--------+-----------------------+--------+ ICA Prox  61      10              calcific                        +----------+--------+--------+--------+-----------------------+--------+ ICA Distal41      10                                     tortuous +----------+--------+--------+--------+-----------------------+--------+ ECA       69      9                                               +----------+--------+--------+--------+-----------------------+--------+ +----------+--------+--------+--------+-------------------+           PSV cm/sEDV cm/sDescribeArm Pressure (mmHG) +----------+--------+--------+--------+-------------------+ Subclavian229                                         +----------+--------+--------+--------+-------------------+ +---------+--------+--+--------+--+---------+ VertebralPSV cm/s54EDV cm/s11Antegrade +---------+--------+--+--------+--+---------+   Summary: Right Carotid: Velocities in the right ICA are consistent with a 1-39% stenosis. Left Carotid:  Velocities in the left ICA are consistent with a 1-39% stenosis. Vertebrals: Bilateral vertebral arteries demonstrate antegrade flow. *See table(s) above for measurements and observations.  Electronically signed by Monica Martinez MD on 12/03/2019 at 2:01:49 PM.    Final    CT Angio Abd/Pel w/ and/or w/o  Result Date: 12/02/2019 CLINICAL DATA:  Inpatient. Severe aortic stenosis. Pre-TAVR planning. EXAM: CT ANGIOGRAPHY CHEST, ABDOMEN AND PELVIS TECHNIQUE: Multidetector CT imaging through the chest, abdomen and pelvis was performed using the standard protocol during bolus administration of intravenous contrast. Multiplanar reconstructed images and MIPs were obtained and reviewed to evaluate the vascular anatomy. CONTRAST:  44mL OMNIPAQUE IOHEXOL 350 MG/ML SOLN COMPARISON:  11/26/2019 CT angiogram of the chest, abdomen and pelvis. FINDINGS: CTA CHEST FINDINGS Cardiovascular: Normal heart size. No significant pericardial effusion/thickening. Diffuse thickening and coarse calcification of the aortic valve. Three-vessel coronary atherosclerosis. Atherosclerotic nonaneurysmal thoracic aorta. Normal caliber pulmonary arteries. No central pulmonary emboli. Mediastinum/Nodes: Scattered subcentimeter hypodense bilateral thyroid nodules. Not clinically significant; no follow-up imaging recommended (ref: J Am Coll Radiol. 2015 Feb;12(2): 143-50). Unremarkable esophagus. No pathologically enlarged axillary, mediastinal or hilar lymph nodes. Lungs/Pleura: No pneumothorax. No pleural effusion. No acute consolidative airspace disease or lung masses. Anterior left upper lobe tiny 3 mm solid pulmonary nodule (  series 5/image 46), stable since recent chest CT. No additional significant pulmonary nodules. Musculoskeletal: No aggressive appearing focal osseous lesions. Moderate thoracic spondylosis. Symmetric moderate bilateral gynecomastia, stable. CTA ABDOMEN AND PELVIS FINDINGS Hepatobiliary: Normal liver with no liver mass.  Stable distended gallbladder with layering subcentimeter calcified gallstones. No gallbladder wall thickening or pericholecystic fluid. No biliary ductal dilatation. Pancreas: Normal, with no mass or duct dilation. Spleen: Normal size. No mass. Adrenals/Urinary Tract: Left adrenal 2.6 cm nodule with density 80 HU and right adrenal 1.7 cm nodule with density 57 HU, both characterized as benign adenomas on recent noncontrast chest CT. No overt hydronephrosis. No contour deforming renal masses. Normal nondistended bladder. Stomach/Bowel: Normal non-distended stomach. Normal caliber small bowel with no small bowel wall thickening. Normal appendix. Normal large bowel with no diverticulosis, large bowel wall thickening or pericolonic fat stranding. Vascular/Lymphatic: Atherosclerotic nonaneurysmal abdominal aorta. No pathologically enlarged lymph nodes in the abdomen or pelvis. Reproductive: Mild prostatomegaly. Brachytherapy seeds scattered throughout the prostate. Other: No pneumoperitoneum, ascites or focal fluid collection. Small fat containing left inguinal hernia. Musculoskeletal: No aggressive appearing focal osseous lesions. Mild lumbar spondylosis. VASCULAR MEASUREMENTS PERTINENT TO TAVR: AORTA: Minimal Aortic Diameter-14.8 x 13.9 mm Severity of Aortic Calcification-moderate to severe RIGHT PELVIS: Right Common Iliac Artery - Minimal Diameter-10.2 x 6.0 mm Tortuosity-mild Calcification-severe Right External Iliac Artery - Minimal Diameter-5.5 x 4.6 mm Tortuosity-mild-to-moderate Calcification-severe Right Common Femoral Artery - Minimal Diameter-7.3 x 6.7 mm Tortuosity-mild Calcification-moderate LEFT PELVIS: Left Common Iliac Artery - Minimal Diameter-9.3 x 8.2 mm Tortuosity-mild Calcification-severe Left External Iliac Artery - Minimal Diameter-7.1 x 5.1 mm Tortuosity-mild mild Calcification-severe Left Common Femoral Artery - Minimal Diameter-8.3 x 7.0 mm Tortuosity-mild Calcification-moderate Review of the  MIP images confirms the above findings. IMPRESSION: 1. Vascular findings and measurements pertinent to potential TAVR procedure, as detailed. 2. Diffuse thickening and coarse calcification of the aortic valve, compatible with the reported history of severe aortic stenosis. 3. Three-vessel coronary atherosclerosis. 4. Tiny 3 mm solid left upper lobe pulmonary nodule. No follow-up needed if patient is low-risk. Non-contrast chest CT can be considered in 12 months if patient is high-risk. This recommendation follows the consensus statement: Guidelines for Management of Incidental Pulmonary Nodules Detected on CT Images: From the Fleischner Society 2017; Radiology 2017; 284:228-243. 5. Cholelithiasis. 6. Bilateral adrenal adenomas. 7. Mild prostatomegaly. 8. Small fat containing left inguinal hernia. 9. Aortic Atherosclerosis (ICD10-I70.0). Electronically Signed   By: Ilona Sorrel M.D.   On: 12/02/2019 13:55   US Abdomen Limited RUQ  Result Date: 11/26/2019 CLINICAL DATA:  Abdominal pain EXAM: ULTRASOUND ABDOMEN LIMITED RIGHT UPPER QUADRANT COMPARISON:  None. FINDINGS: Gallbladder: The gallbladder is filled with gallbladder sludge and multiple gallstones. The gallbladder is distended. There is a stone that appears to be wedged at the gallbladder neck. There is no gallbladder wall thickening. The sonographic Percell Miller sign is reported as negative. Common bile duct: Diameter: 3 mm Liver: There is a heterogeneous appearance of the liver parenchyma without evidence for a discrete hepatic mass. Portal vein is patent on color Doppler imaging with normal direction of blood flow towards the liver. Other: Incidentally noted is right-sided hydronephrosis. IMPRESSION: 1. Cholelithiasis without definite CT evidence for acute cholecystitis. However, the gallbladder is distended with a stone that appears to be wedged at the gallbladder neck. 2. Incidentally noted right-sided hydronephrosis. Electronically Signed   By: Constance Holster M.D.   On: 11/26/2019 17:39    Microbiology: Recent Results (from the past 240 hour(s))  Respiratory Panel by RT  PCR (Flu A&B, Covid) - Nasopharyngeal Swab     Status: None   Collection Time: 12/13/19  9:46 PM   Specimen: Nasopharyngeal Swab  Result Value Ref Range Status   SARS Coronavirus 2 by RT PCR NEGATIVE NEGATIVE Final    Comment: (NOTE) SARS-CoV-2 target nucleic acids are NOT DETECTED.  The SARS-CoV-2 RNA is generally detectable in upper respiratoy specimens during the acute phase of infection. The lowest concentration of SARS-CoV-2 viral copies this assay can detect is 131 copies/mL. A negative result does not preclude SARS-Cov-2 infection and should not be used as the sole basis for treatment or other patient management decisions. A negative result may occur with  improper specimen collection/handling, submission of specimen other than nasopharyngeal swab, presence of viral mutation(s) within the areas targeted by this assay, and inadequate number of viral copies (<131 copies/mL). A negative result must be combined with clinical observations, patient history, and epidemiological information. The expected result is Negative.  Fact Sheet for Patients:  PinkCheek.be  Fact Sheet for Healthcare Providers:  GravelBags.it  This test is no t yet approved or cleared by the [place] FDA and  has been authorized for detection and/or diagnosis of SARS-CoV-2 by FDA under an Emergency Use Authorization (EUA). This EUA will remain  in effect (meaning this test can be used) for the duration of the COVID-19 declaration under Section 564(b)(1) of the Act, 21 U.S.C. section 360bbb-3(b)(1), unless the authorization is terminated or revoked sooner.     Influenza A by PCR NEGATIVE NEGATIVE Final   Influenza B by PCR NEGATIVE NEGATIVE Final    Comment: (NOTE) The Xpert Xpress SARS-CoV-2/FLU/RSV assay is intended as an  aid in  the diagnosis of influenza from Nasopharyngeal swab specimens and  should not be used as a sole basis for treatment. Nasal washings and  aspirates are unacceptable for Xpert Xpress SARS-CoV-2/FLU/RSV  testing.  Fact Sheet for Patients: PinkCheek.be  Fact Sheet for Healthcare Providers: GravelBags.it  This test is not yet approved or cleared by the [place] FDA and  has been authorized for detection and/or diagnosis of SARS-CoV-2 by  FDA under an Emergency Use Authorization (EUA). This EUA will remain  in effect (meaning this test can be used) for the duration of the  Covid-19 declaration under Section 564(b)(1) of the Act, 21  U.S.C. section 360bbb-3(b)(1), unless the authorization is  terminated or revoked. Performed at [facility], [place] [address] [place], [place] [address]      Labs: Basic Metabolic Panel: Recent Labs  Lab 12/13/19 1715 12/14/19 0245  NA 138 138  K 4.2 3.9  CL 95* 97*  CO2 29 29  GLUCOSE 198* 174*  BUN 23 24*  CREATININE 5.56* 6.54*  CALCIUM 10.7* 10.1   Liver Function Tests: No results for input(s): AST, ALT, ALKPHOS, BILITOT, PROT, ALBUMIN in the last 168 hours. No results for input(s): LIPASE, AMYLASE in the last 168 hours. No results for input(s): AMMONIA in the last 168 hours. CBC: Recent Labs  Lab 12/13/19 1715 12/14/19 0245 12/15/19 0820  WBC 6.4 5.5 5.3  NEUTROABS  --   --  2.8  HGB 11.8* 10.8* 10.9*  HCT 37.7* 32.9* 34.3*  MCV 103.9* 104.1* 101.5*  PLT 253 231 222   Cardiac Enzymes: No results for input(s): CKTOTAL, CKMB, CKMBINDEX, TROPONINI in the last 168 hours. BNP: BNP (last 3 results) No results for input(s): BNP in the last 8760 hours.  ProBNP (last 3 results) No results for input(s): PROBNP  in the last 8760 hours.  CBG: Recent Labs  Lab 12/14/19 0808 12/14/19 1236 12/14/19 1613 12/14/19 2108 12/15/19 0551  GLUCAP 127* 178* 173* 205*  145*       Signed:  Nita Sells MD   [facility] 12/15/2019, 9:49 AM

## 2019-12-17 DIAGNOSIS — N186 End stage renal disease: Secondary | ICD-10-CM | POA: Diagnosis not present

## 2019-12-17 DIAGNOSIS — Z23 Encounter for immunization: Secondary | ICD-10-CM | POA: Diagnosis not present

## 2019-12-17 DIAGNOSIS — N2581 Secondary hyperparathyroidism of renal origin: Secondary | ICD-10-CM | POA: Diagnosis not present

## 2019-12-17 DIAGNOSIS — E118 Type 2 diabetes mellitus with unspecified complications: Secondary | ICD-10-CM | POA: Diagnosis not present

## 2019-12-17 DIAGNOSIS — D631 Anemia in chronic kidney disease: Secondary | ICD-10-CM | POA: Diagnosis not present

## 2019-12-17 DIAGNOSIS — Z992 Dependence on renal dialysis: Secondary | ICD-10-CM | POA: Diagnosis not present

## 2019-12-18 ENCOUNTER — Telehealth: Payer: Medicare Other

## 2019-12-18 ENCOUNTER — Telehealth: Payer: Self-pay

## 2019-12-18 NOTE — Telephone Encounter (Signed)
°  Chronic Care Management   Outreach Note  12/18/2019 Name: Elijah White MRN: [id number] DOB: [birth year]  Referred by: [name], MD Reason for referral : Care Coordination   SW placed a successful outbound call to the patients daughter [name] to assist with care coordination needs. [name] reports she is unable to completed the call at this time but will call SW back later this afternoon.  Follow Up Plan: SW will await return call from Mrs [name].  [name], BSW, CDP Social Worker, Certified Dementia Practitioner [facility] [phone]

## 2019-12-19 DIAGNOSIS — Z23 Encounter for immunization: Secondary | ICD-10-CM | POA: Diagnosis not present

## 2019-12-19 DIAGNOSIS — Z992 Dependence on renal dialysis: Secondary | ICD-10-CM | POA: Diagnosis not present

## 2019-12-19 DIAGNOSIS — E118 Type 2 diabetes mellitus with unspecified complications: Secondary | ICD-10-CM | POA: Diagnosis not present

## 2019-12-19 DIAGNOSIS — D631 Anemia in chronic kidney disease: Secondary | ICD-10-CM | POA: Diagnosis not present

## 2019-12-19 DIAGNOSIS — N2581 Secondary hyperparathyroidism of renal origin: Secondary | ICD-10-CM | POA: Diagnosis not present

## 2019-12-19 DIAGNOSIS — N186 End stage renal disease: Secondary | ICD-10-CM | POA: Diagnosis not present

## 2019-12-22 DIAGNOSIS — Z992 Dependence on renal dialysis: Secondary | ICD-10-CM | POA: Diagnosis not present

## 2019-12-22 DIAGNOSIS — Z23 Encounter for immunization: Secondary | ICD-10-CM | POA: Diagnosis not present

## 2019-12-22 DIAGNOSIS — N2581 Secondary hyperparathyroidism of renal origin: Secondary | ICD-10-CM | POA: Diagnosis not present

## 2019-12-22 DIAGNOSIS — N186 End stage renal disease: Secondary | ICD-10-CM | POA: Diagnosis not present

## 2019-12-22 DIAGNOSIS — D631 Anemia in chronic kidney disease: Secondary | ICD-10-CM | POA: Diagnosis not present

## 2019-12-22 DIAGNOSIS — E118 Type 2 diabetes mellitus with unspecified complications: Secondary | ICD-10-CM | POA: Diagnosis not present

## 2019-12-23 ENCOUNTER — Ambulatory Visit: Payer: Self-pay

## 2019-12-23 ENCOUNTER — Encounter: Payer: Self-pay | Admitting: Surgery

## 2019-12-23 ENCOUNTER — Other Ambulatory Visit: Payer: Self-pay

## 2019-12-23 ENCOUNTER — Institutional Professional Consult (permissible substitution) (INDEPENDENT_AMBULATORY_CARE_PROVIDER_SITE_OTHER): Payer: Medicare Other | Admitting: Surgery

## 2019-12-23 VITALS — BP 127/80 | HR 76 | Temp 97.6°F | Resp 20 | Ht 61.0 in | Wt 167.0 lb

## 2019-12-23 DIAGNOSIS — E1122 Type 2 diabetes mellitus with diabetic chronic kidney disease: Secondary | ICD-10-CM

## 2019-12-23 DIAGNOSIS — I350 Nonrheumatic aortic (valve) stenosis: Secondary | ICD-10-CM

## 2019-12-23 DIAGNOSIS — N186 End stage renal disease: Secondary | ICD-10-CM

## 2019-12-23 NOTE — H&P (View-Only) (Signed)
Patient ID: Elijah White, male   DOB: [birth year], 79 y.o.   MRN: [id number]  [facility] CONSULTATION REPORT  Referring Provider is O'Neal, Cassie Freer, * Primary Cardiologist is No primary care provider on file. PCP is [name], MD  Chief Complaint  Patient presents with  . 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  He was not seen by cardiology.  He was readmitted on 11/26/2019 after he presented via EMS with chest and epigastric pain as well as shortness of breath while going to hemodialysis. HS troponin was 36--> 37.  Electrocardiogram showed right bundle branch block with no acute changes.  A chest x-ray showed pulmonary vascular congestion.  He had a CTA of the chest, abdomen, and pelvis that was negative for PE and aortic dissection.  There was a 2.3 cm enhancing mass within the lumen of the mid transverse colon concerning for neoplasm as well as a distended gallbladder with numerous calcified gallstones and a 2.7 cm indeterminate left adrenal mass.  He was noted to have a murmur on exam and cardiology consultation was obtained.  He underwent colonoscopy which showed a polyp without evidence of malignancy.  2D echocardiogram showed moderate to severe aortic stenosis with a peak velocity of 3.7 m/s and a mean gradient of 26 mmHg.  Aortic valve area was 1 cm with a dimensionless index of 0.31.  Left ventricular systolic function was normal.  His murmur was felt to be consistent with severe aortic stenosis.  His gated cardiac CTA showed a calcium score of 2336 with a trileaflet aortic valve that  was severely calcified and thickened.  Leaflets had severely restricted mobility.  This was felt to be consistent with severe aortic stenosis.  He underwent cardiac catheterization on 12/03/2019 which showed mild nonobstructive coronary disease.  The mean gradient across aortic valve was 26.8 mmHg.  The patient is here today with his daughter.  He has been a widower for 14 years and lives by himself in an apartment in [place].  He has 2 children with his daughter living locally.  His daughter and his knees help him get things from the store but he takes care of himself independently at home.  He does use a walker for assistance.  He was noted to have poor dentition when evaluated by cardiology and underwent dental evaluation.  He has been scheduled for dental extractions on Wednesday, 12/30/2019.  He says that he has been doing fairly well at home.  He does have some exertional shortness of breath and fatigue.  He denies any chest pain or pressure.  He has had no peripheral edema.  He denies dizziness and syncope.  Past Medical History:  Diagnosis Date  . Anemia    low iron  . Arthritis   . Cancer [facility]) ?2006   prostate  . Chronic kidney disease    Stage 4  . Colon polyps ~ 1993 and 2003   Dr [name], [facility].  08/2001 colonoscopy: tubular adenoma at cecum.    . Diabetes mellitus without complication ([place])   . Elevated cholesterol with high triglycerides   . GERD (gastroesophageal reflux disease)   .  Hypertension   . Legally blind in left eye, as defined in [place]    has pinpoint vision in right eye    Past Surgical History:  Procedure Laterality Date  . AV FISTULA PLACEMENT Left 03/20/2018   Procedure: ARTERIOVENOUS (AV) FISTULA CREATION ARM;  Surgeon: [name], MD;  Location: [place];  Service: Vascular;  Laterality: Left;  . BASCILIC VEIN TRANSPOSITION Left 05/21/2018   Procedure: LEFT BASILIC VEIN FISTULA SECOND STAGE;  Surgeon: [name], MD;  Location:  [place];  Service: Vascular;  Laterality: Left;  . COLONOSCOPY    . COLONOSCOPY WITH PROPOFOL N/A 11/30/2019   Procedure: COLONOSCOPY WITH PROPOFOL;  Surgeon: Ronnette Juniper, MD;  Location: [facility];  Service: Gastroenterology;  Laterality: N/A;  . GLAUCOMA SURGERY  2019   multiple surgeries  . HEMOSTASIS CLIP PLACEMENT  11/30/2019   Procedure: HEMOSTASIS CLIP PLACEMENT;  Surgeon: Ronnette Juniper, MD;  Location: [place];  Service: Gastroenterology;;  . KNEE ARTHROSCOPY    . POLYPECTOMY  11/30/2019   Procedure: POLYPECTOMY;  Surgeon: Ronnette Juniper, MD;  Location: [place];  Service: Gastroenterology;;  . PROSTATE SURGERY    . RIGHT/LEFT HEART CATH AND CORONARY ANGIOGRAPHY N/A 12/03/2019   Procedure: RIGHT/LEFT HEART CATH AND CORONARY ANGIOGRAPHY;  Surgeon: Burnell Blanks, MD;  Location: [facility];  Service: Cardiovascular;  Laterality: N/A;  . SUBMUCOSAL TATTOO INJECTION  11/30/2019   Procedure: SUBMUCOSAL TATTOO INJECTION;  Surgeon: Ronnette Juniper, MD;  Location: [facility] ENDOSCOPY;  Service: Gastroenterology;;    Family History  Problem Relation Age of Onset  . CAD Mother     Social History   Socioeconomic History  . Marital status: Widowed    Spouse name: Not on file  . Number of children: Not on file  . Years of education: Not on file  . Highest education level: Not on file  Occupational History  . Occupation: retired  Tobacco Use  . Smoking status: Former Smoker    Packs/day: 0.50    Quit date: 03/23/2018    Years since quitting: 1.7  . Smokeless tobacco: Never Used  Vaping Use  . Vaping Use: Never used  Substance and Sexual Activity  . Alcohol use: No  . Drug use: No  . Sexual activity: Not Currently  Other Topics Concern  . Not on file  Social History Narrative  . Not on file   Social Determinants of Health   Financial Resource Strain: Low Risk   . Difficulty of Paying Living Expenses: Not hard at all  Food Insecurity: No Food Insecurity  . Worried  About Charity fundraiser in the Last Year: Never true  . Ran Out of Food in the Last Year: Never true  Transportation Needs: No Transportation Needs  . Lack of Transportation (Medical): No  . Lack of Transportation (Non-Medical): No  Physical Activity: Inactive  . Days of Exercise per Week: 0 days  . Minutes of Exercise per Session: 0 min  Stress: No Stress Concern Present  . Feeling of Stress : Not at all  Social Connections:   . Frequency of Communication with Friends and Family: Not on file  . Frequency of Social Gatherings with Friends and Family: Not on file  . Attends Religious Services: Not on file  . Active Member of Clubs or Organizations: Not on file  . Attends Archivist Meetings: Not on file  . Marital Status: Not on file  Intimate Partner Violence:   . Fear of Current or  Ex-Partner: Not on file  . Emotionally Abused: Not on file  . Physically Abused: Not on file  . Sexually Abused: Not on file    Current Outpatient Medications  Medication Sig Dispense Refill  . acetaminophen (TYLENOL) 500 MG tablet Take 2 tablets (1,000 mg total) by mouth every 6 (six) hours as needed for mild pain, moderate pain or headache. 60 tablet 0  . apraclonidine (IOPIDINE) 0.5 % ophthalmic solution Place 1 drop into the right eye 3 (three) times daily.     . atorvastatin (LIPITOR) 10 MG tablet TAKE 1 TABLET BY MOUTH EVERY DAY (Patient taking differently: Take 10 mg by mouth daily. ) 90 tablet 0  . B Complex-C-Folic Acid (DIALYVITE 800) 0.8 MG TABS Take 1 tablet by mouth daily.    . BD PEN NEEDLE NANO U/F 32G X 4 MM MISC USE SUBCUTANEOUS FOUR TIMES A DAY    . blood glucose meter kit and supplies KIT Dispense based on patient and insurance preference. Use up to four times daily as directed. (FOR ICD-9 250.00, 250.01). For QAC - HS accuchecks. 1 each 1  . carvedilol (COREG) 3.125 MG tablet TAKE 1 TABLET(3.125 MG) BY MOUTH TWICE DAILY WITH A MEAL (Patient taking differently: Take 3.125 mg  by mouth 2 (two) times daily with a meal. ) 180 tablet 1  . diclofenac Sodium (VOLTAREN) 1 % GEL Apply 4 g topically 4 (four) times daily as needed. (Patient taking differently: Apply 4 g topically 4 (four) times daily as needed (pain). ) 100 g 0  . dorzolamide-timolol (COSOPT) 22.3-6.8 MG/ML ophthalmic solution Place 1 drop into the right eye 2 (two) times daily.  11  . esomeprazole (NEXIUM) 40 MG capsule TAKE 1 CAPSULE BY MOUTH EVERY DAY (Patient taking differently: Take 40 mg by mouth every other day. ) 90 capsule 1  . ferric citrate (AURYXIA) 1 GM 210 MG(Fe) tablet Take 420 mg by mouth 3 (three) times daily with meals.    . gabapentin (NEURONTIN) 100 MG capsule TAKE 1 CAPSULE(100 MG) BY MOUTH AT BEDTIME (Patient taking differently: Take 100 mg by mouth at bedtime. ) 90 capsule 1  . hydrALAZINE (APRESOLINE) 25 MG tablet TAKE 1 TABLET(25 MG) BY MOUTH TWICE DAILY (Patient taking differently: Take 25 mg by mouth in the morning and at bedtime. ) 180 tablet 1  . hydroxypropyl methylcellulose / hypromellose (ISOPTO TEARS / GONIOVISC) 2.5 % ophthalmic solution Place 1 drop into the left eye as needed for dry eyes.    . isosorbide mononitrate (IMDUR) 30 MG 24 hr tablet TAKE 1 TABLET(30 MG) BY MOUTH DAILY (Patient taking differently: Take 30 mg by mouth daily. ) 90 tablet 1  . Lancets (ONETOUCH DELICA PLUS LANCET33G) MISC USE AS DIRECTED UP TO FOUR TIMES A DAY    . latanoprost (XALATAN) 0.005 % ophthalmic solution Place 1 drop into the right eye at bedtime.    . ONETOUCH VERIO test strip USE AS DIRECTED TO TEST FOUR TIMES A DAY 100 each 3  . pilocarpine (PILOCAR) 4 % ophthalmic solution Place 1 drop into the right eye 4 (four) times daily.     . tamsulosin (FLOMAX) 0.4 MG CAPS capsule Take 1 capsule (0.4 mg total) by mouth at bedtime. (Patient not taking: Reported on 12/23/2019) 30 capsule 0   No current facility-administered medications for this visit.    No Known Allergies    Review of  Systems:   General:  normal appetite, + decreased energy, no weight gain, + weight loss, no   fever  Cardiac:  no chest pain with exertion, no chest pain at rest, +SOB with  exertion, + resting SOB, no PND, no orthopnea, no palpitations, no arrhythmia, no atrial fibrillation, no LE edema, no dizzy spells, no syncope  Respiratory:  + shortness of breath, no home oxygen, no productive cough, no dry cough, no bronchitis, no wheezing, no hemoptysis, no asthma, no pain with inspiration or cough, no sleep apnea, no CPAP at night  GI:   no difficulty swallowing, + reflux, + frequent heartburn, no hiatal hernia, no abdominal pain, no constipation, no diarrhea, no hematochezia, no hematemesis, no melena  GU:   no dysuria,  no frequency, no urinary tract infection, no hematuria, no enlarged prostate, no kidney stones, + chronic kidney disease  Vascular:  no pain suggestive of claudication, no pain in feet, no leg cramps, no varicose veins, no DVT, no non-healing foot ulcer  Neuro:   no stroke, no TIA's, no seizures, no headaches, no temporary blindness one eye,  no slurred speech, + peripheral neuropathy, no chronic pain, no instability of gait, no memory/cognitive dysfunction  Musculoskeletal: + arthritis, + joint swelling, no myalgias, no difficulty walking, normal mobility but uses walker for balance due to blindness.  Skin:   no rash, no itching, no skin infections, no pressure sores or ulcerations  Psych:   no anxiety, no depression, no nervousness, no unusual recent stress  Eyes:   no blurry vision, no floaters, no recent vision changes, does not wear glasses or contacts  ENT:   no hearing loss, + loose or painful teeth, no dentures, last saw dentist 12/14/19, Dr. Sandi Mariscal. Scheduled for extractions.  Hematologic:  no easy bruising, no abnormal bleeding, no clotting disorder, no frequent epistaxis  Endocrine:  + diabetes, does not check CBG's at home       Physical Exam:   BP 127/80   Pulse 76    Temp 97.6 F (36.4 C) (Skin)   Resp 20   Ht 5' 1" (1.549 m)   Wt 167 lb (75.8 kg)   SpO2 98% Comment: RA  BMI 31.55 kg/m   General:  Elderly,  well-appearing  HEENT:   Multiple broken off teeth.  Neck:   no JVD, no bruits, no adenopathy   Chest:   clear to auscultation, symmetrical breath sounds, no wheezes, no rhonchi   CV:   RRR, grade lll/VI crescendo/decrescendo murmur heard best at RSB,  no diastolic murmur  Abdomen:  soft, non-tender, no masses   Extremities:  warm, well-perfused, pulses palpable at ankle, no LE edema, left upper arm AV fistula with a palpable thrill.  Rectal/GU  Deferred  Neuro:   Grossly non-focal and symmetrical throughout  Skin:   Clean and dry, no rashes, no breakdown   Diagnostic Tests:  ECHOCARDIOGRAM REPORT       Patient Name:  Elijah White Date of Exam: 11/28/2019  Medical Rec #: [id number]    Height:    60.0 in  Accession #:  [id number]   Weight:    162.5 lb  Date of Birth: 04-21-1940    BSA:     1.709 m  Patient Age:  71 years    BP:      155/71 mmHg  Patient Gender: M        HR:      76 bpm.  Exam Location: Inpatient   Procedure: 2D Echo   Indications:  chest pain 786.50    History:    Patient has prior  history of Echocardiogram examinations,  most         recent 09/02/2017. End stage renal disease, Aortic Valve  Disease;         Risk Factors:Diabetes and Dyslipidemia.    Sonographer:  Johny Chess  Referring Phys: [id number] Champ Mungo TAYLOR     Sonographer Comments: Image acquisition challenging due to respiratory  motion.  IMPRESSIONS    1. Left ventricular ejection fraction, by estimation, is 65 to 70%. The  left ventricle has normal function. The left ventricle has no regional  wall motion abnormalities. Left ventricular diastolic parameters are  indeterminate.  2. Right ventricular systolic function is normal. The right ventricular  size  is normal. Tricuspid regurgitation signal is inadequate for assessing  PA pressure.  3. The mitral valve is degenerative. Trivial mitral valve regurgitation.  No evidence of mitral stenosis. Severe mitral annular calcification.  4. The aortic valve is calcified. There is severe calcifcation of the  aortic valve. There is severe thickening of the aortic valve. Aortic valve  regurgitation is trivial. 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  5. The inferior vena cava is normal in size with greater than 50%  respiratory variability, suggesting right atrial pressure of 3 mmHg.   FINDINGS  Left Ventricle: Left ventricular ejection fraction, by estimation, is 65  to 70%. The left ventricle has normal function. The left ventricle has no  regional wall motion abnormalities. The left ventricular internal cavity  size was normal in size. There is  no left ventricular hypertrophy. Left ventricular diastolic parameters  are indeterminate.   Right Ventricle: The right ventricular size is normal. No increase in  right ventricular wall thickness. Right ventricular systolic function is  normal. Tricuspid regurgitation signal is inadequate for assessing PA  pressure.   Left Atrium: Left atrial size was normal in size.   Right Atrium: Right atrial size was normal in size.   Pericardium: There is no evidence of pericardial effusion.   Mitral Valve: The mitral valve is degenerative in appearance. Severe  mitral annular calcification. Trivial mitral valve regurgitation. No  evidence of mitral valve stenosis.   Tricuspid Valve: The tricuspid valve is grossly normal. Tricuspid valve  regurgitation is not demonstrated.   Aortic Valve: The aortic valve is calcified. There is severe calcifcation  of the aortic valve. There is severe thickening of the aortic valve.  Aortic valve regurgitation is trivial. Moderate to severe aortic stenosis  is present. Aortic valve mean  gradient measures 26.0  mmHg. Aortic valve peak gradient measures 54.8  mmHg. Aortic valve area, by VTI measures 1.06 cm.   Pulmonic Valve: The pulmonic valve was not well visualized. Pulmonic valve  regurgitation is not visualized.   Aorta: The aortic root is normal in size and structure.   Venous: The inferior vena cava is normal in size with greater than 50%  respiratory variability, suggesting right atrial pressure of 3 mmHg.   IAS/Shunts: No atrial level shunt detected by color flow Doppler.     LEFT VENTRICLE  PLAX 2D  LVIDd:     3.90 cm Diastology  LVIDs:     2.35 cm LV e' medial:  4.57 cm/s  LV PW:     1.00 cm LV E/e' medial: 15.5  LV IVS:    0.90 cm LV e' lateral:  8.05 cm/s  LVOT diam:   2.08 cm LV E/e' lateral: 8.8  LV SV:     76  LV SV Index:  44  LVOT Area:  3.40 cm     RIGHT VENTRICLE       IVC  RV S prime:   11.00 cm/s IVC diam: 1.25 cm  TAPSE (M-mode): 2.0 cm   LEFT ATRIUM       Index    RIGHT ATRIUM     Index  LA diam:    3.40 cm 1.99 cm/m RA Area:   9.48 cm  LA Vol (A2C):  53.7 ml 31.42 ml/m RA Volume:  18.10 ml 10.59 ml/m  LA Vol (A4C):  30.6 ml 17.91 ml/m  LA Biplane Vol: 41.8 ml 24.46 ml/m  AORTIC VALVE  AV Area (Vmax):  0.89 cm  AV Area (Vmean):  0.97 cm  AV Area (VTI):   1.06 cm  AV Vmax:      370.00 cm/s  AV Vmean:     231.000 cm/s  AV VTI:      0.716 m  AV Peak Grad:   54.8 mmHg  AV Mean Grad:   26.0 mmHg  LVOT Vmax:     96.71 cm/s  LVOT Vmean:    66.068 cm/s  LVOT VTI:     0.223 m  LVOT/AV VTI ratio: 0.31    AORTA  Ao Root diam: 3.30 cm   MITRAL VALVE  MV Area (PHT): 1.94 cm   SHUNTS  MV Decel Time: 391 msec   Systemic VTI: 0.22 m  MV E velocity: 71.00 cm/s  Systemic Diam: 2.08 cm  MV A velocity: 140.00 cm/s  MV E/A ratio: 0.51   Mihai Croitoru MD  Electronically signed by Mihai Croitoru MD  Signature Date/Time:  11/29/2019/8:09:10 AM     Physicians  Panel Physicians Referring Physician Case Authorizing Physician  McAlhany, Christopher D, MD (Primary)    Procedures  RIGHT/LEFT HEART CATH AND CORONARY ANGIOGRAPHY  Conclusion    Prox RCA to Mid RCA lesion is 20% stenosed.  Dist RCA lesion is 20% stenosed.  RPAV lesion is 40% stenosed.  Mid Cx lesion is 20% stenosed.  Prox LAD lesion is 30% stenosed.   1. Mild non-obstructive CAD 2. Severe aortic stenosis by echo (cath mean gradient 26.8 mmHg0  Recommendations: will continue workup for TAVR. He has had his scans. He can likely be discharged home later today and we will complete his TAVR workup as an outpatient.   Recommendations  Antiplatelet/Anticoag Will continue workup for TAVR.  Surgeon Notes    11/30/2019 3:42 PM GI Operative Note revision by Karki, Arya, MD    11/30/2019 9:05 AM Brief Op Note signed by Karki, Arya, MD  Indications  Severe aortic stenosis [I35.0 (ICD-10-CM)]  Procedural Details  Technical Details Indication: Severe aortic stenosis  Procedure: The risks, benefits, complications, treatment options, and expected outcomes were discussed with the patient. The patient and/or family concurred with the proposed plan, giving informed consent. The patient was brought to the cath lab after IV hydration was given. The patient was not sedated.  The right groin was prepped and draped in the usual manner. Using the modified Seldinger access technique, a 5 French sheath was placed in the right femoral artery and a 7 French sheath was placed in the right femoral vein using u/s guidance. Right heart catheterization performed with a Swan-Ganz catheter. Standard diagnostic catheters were used to perform selective coronary angiography. I crossed the aortic valve with an AL-1 catheter and a straight wire. Mynx closure device placed in the right femoral artery.   There were no immediate complications. The patient was taken to the  recovery   area in stable condition.   Estimated blood loss <50 mL.   During this procedure no sedation was administered.  Medications (Filter: Administrations occurring from 0730 to 0822 on 12/03/19) (important) Continuous medications are totaled by the amount administered until 12/03/19 0822.  lidocaine (PF) (XYLOCAINE) 1 % injection (mL) Total volume:  15 mL Date/Time  Rate/Dose/Volume Action  12/03/19 0748  15 mL Given    Heparin (Porcine) in NaCl 1000-0.9 UT/500ML-% SOLN (mL) Total volume:  1,000 mL Date/Time  Rate/Dose/Volume Action  12/03/19 0815  500 mL Given  0815  500 mL Given    iohexol (OMNIPAQUE) 350 MG/ML injection (mL) Total volume:  50 mL Date/Time  Rate/Dose/Volume Action  12/03/19 0819  50 mL Given    apraclonidine (IOPIDINE) 0.5 % ophthalmic solution 1 drop (drop) Total dose:  Cannot be calculated* *Administration dose not documented Date/Time  Rate/Dose/Volume Action  12/03/19 0730  *Not included in total MAR Hold    atorvastatin (LIPITOR) tablet 10 mg (mg) Total dose:  Cannot be calculated* *Administration dose not documented Date/Time  Rate/Dose/Volume Action  12/03/19 0730  *Not included in total MAR Hold    carvedilol (COREG) tablet 3.125 mg (mg) Total dose:  Cannot be calculated* *Administration dose not documented Date/Time  Rate/Dose/Volume Action  12/03/19 0730  *Not included in total MAR Hold    Chlorhexidine Gluconate Cloth 2 % PADS 6 each (each) Total dose:  Cannot be calculated* Dosing weight:  73.5 *Administration dose not documented Date/Time  Rate/Dose/Volume Action  12/03/19 0730  *Not included in total MAR Hold  0800  *Not included in total Automatically Held    diclofenac Sodium (VOLTAREN) 1 % topical gel 4 g (g) Total dose:  Cannot be calculated* Dosing weight:  74.2 *Administration dose not documented Date/Time  Rate/Dose/Volume Action  12/03/19 0730  *Not included in total MAR Hold    dorzolamide-timolol (COSOPT) 22.3-6.8  MG/ML ophthalmic solution 1 drop (drop) Total dose:  Cannot be calculated* Dosing weight:  74.2 *Administration dose not documented Date/Time  Rate/Dose/Volume Action  12/03/19 0730  *Not included in total MAR Hold    doxercalciferol (HECTOROL) injection 7 mcg (mcg) Total dose:  Cannot be calculated* Dosing weight:  75.5 *Administration dose not documented Date/Time  Rate/Dose/Volume Action  12/03/19 0730  *Not included in total MAR Hold    ferric citrate (AURYXIA) tablet 420 mg (mg) Total dose:  Cannot be calculated* *Administration dose not documented Date/Time  Rate/Dose/Volume Action  12/03/19 0730  *Not included in total MAR Hold    gabapentin (NEURONTIN) capsule 100 mg (mg) Total dose:  Cannot be calculated* *Administration dose not documented Date/Time  Rate/Dose/Volume Action  12/03/19 0730  *Not included in total MAR Hold    heparin injection 5,000 Units (Units) Total dose:  Cannot be calculated* Dosing weight:  78 *Administration dose not documented Date/Time  Rate/Dose/Volume Action  12/03/19 0730  *Not included in total MAR Hold    hydrALAZINE (APRESOLINE) injection 10 mg (mg) Total dose:  Cannot be calculated* Dosing weight:  78 *Administration dose not documented Date/Time  Rate/Dose/Volume Action  12/03/19 0730  *Not included in total MAR Hold    hydrALAZINE (APRESOLINE) tablet 25 mg (mg) Total dose:  Cannot be calculated* *Administration dose not documented Date/Time  Rate/Dose/Volume Action  12/03/19 0730  *Not included in total MAR Hold    hydrocortisone cream 1 % 1 application (application) Total dose:  Cannot be calculated* Dosing weight:  76.2 *Administration dose not documented Date/Time  Rate/Dose/Volume Action  12/03/19 0730  *  Not included in total MAR Hold    HYDROmorphone (DILAUDID) injection 0.5-1 mg (mg) Total dose:  Cannot be calculated* Dosing weight:  78 *Administration dose not documented Date/Time  Rate/Dose/Volume Action  12/03/19  0730  *Not included in total MAR Hold    hydroxypropyl methylcellulose / hypromellose (ISOPTO TEARS / GONIOVISC) 2.5 % ophthalmic solution 1 drop (drop) Total dose:  Cannot be calculated* Dosing weight:  74.2 *Administration dose not documented Date/Time  Rate/Dose/Volume Action  12/03/19 0730  *Not included in total MAR Hold    insulin aspart (novoLOG) injection 0-5 Units (Units) Total dose:  Cannot be calculated* Dosing weight:  78 *Administration dose not documented Date/Time  Rate/Dose/Volume Action  12/03/19 0730  *Not included in total MAR Hold    insulin aspart (novoLOG) injection 0-6 Units (Units) Total dose:  Cannot be calculated* Dosing weight:  75.5 *Administration dose not documented Date/Time  Rate/Dose/Volume Action  12/03/19 0730  *Not included in total MAR Hold    isosorbide mononitrate (IMDUR) 24 hr tablet 30 mg (mg) Total dose:  Cannot be calculated* *Administration dose not documented Date/Time  Rate/Dose/Volume Action  12/03/19 0730  *Not included in total MAR Hold    latanoprost (XALATAN) 0.005 % ophthalmic solution 1 drop (drop) Total dose:  Cannot be calculated* Dosing weight:  75.5 *Administration dose not documented Date/Time  Rate/Dose/Volume Action  12/03/19 0730  *Not included in total MAR Hold    multivitamin (RENA-VIT) tablet 1 tablet (tablet) Total dose:  Cannot be calculated* Dosing weight:  73.5 *Administration dose not documented Date/Time  Rate/Dose/Volume Action  12/03/19 0730  *Not included in total MAR Hold    pantoprazole (PROTONIX) EC tablet 40 mg (mg) Total dose:  Cannot be calculated* Dosing weight:  78 *Administration dose not documented Date/Time  Rate/Dose/Volume Action  12/03/19 0730  *Not included in total MAR Hold    pilocarpine (PILOCAR) 4 % ophthalmic solution 1 drop (drop) Total dose:  Cannot be calculated* Dosing weight:  75.5 *Administration dose not documented Date/Time  Rate/Dose/Volume Action  12/03/19 0730  *Not  included in total MAR Hold    sodium chloride flush (NS) 0.9 % injection 3 mL (mL) Total dose:  Cannot be calculated* Dosing weight:  78.8 *Administration dose not documented Date/Time  Rate/Dose/Volume Action  12/03/19 0730  *Not included in total MAR Hold    tamsulosin (FLOMAX) capsule 0.4 mg (mg) Total dose:  Cannot be calculated* Dosing weight:  73.5 *Administration dose not documented Date/Time  Rate/Dose/Volume Action  12/03/19 0730  *Not included in total MAR Hold    Contrast  Medication Name Total Dose  iohexol (OMNIPAQUE) 350 MG/ML injection 50 mL    Radiation/Fluoro  Fluoro time: 7.4 (min) DAP: 18734 (mGycm2) Cumulative Air Kerma: 482.5 (mGy)  Complications  Complications documented before study signed (12/03/2019 8:33 AM)   RIGHT/LEFT HEART CATH AND CORONARY ANGIOGRAPHY  None Documented by Burnell Blanks, MD 12/03/2019 8:25 AM  Date Found: 12/03/2019  Time Range: Intraprocedure      Coronary Findings  Diagnostic Dominance: Right Left Anterior Descending  Vessel is large.  Prox LAD lesion is 30% stenosed.  Left Circumflex  Vessel is moderate in size.  Mid Cx lesion is 20% stenosed.  Right Coronary Artery  Vessel is large.  Prox RCA to Mid RCA lesion is 20% stenosed.  Dist RCA lesion is 20% stenosed.  Right Posterior Atrioventricular Artery  RPAV lesion is 40% stenosed.  Intervention  No interventions have been documented. Coronary Diagrams  Diagnostic Dominance: Right  Intervention  Implants   Vascular Products  Closure Mynx Control [id number]- Implanted Inventory item: CLOSURE [facility]CONTROL 15F Model/Cat number: [id number] Manufacturer: CLelandLot number: [id number] Device identifier: [id number] identifier type: GS1  Area Of Implantation: Femoral Artery    GUDID Information  Request status Successful    Brand name: MYNX CONTROL Version/Model: [id number] Company name: ARock Hallsafety info  as of 12/03/19: MR Safe  Contains dry or latex rubber: No    GMDN P.T. name: Wound hydrogel dressing, non-antimicrobial    As of 12/03/2019  Status: Implanted      Syngo Images  Show images for CARDIAC CATHETERIZATION Images on Long Term Storage  Show images for ODonal, Lynamto Procedure Log  Procedure Log    Hemo Data   Most Recent Value  Fick Cardiac Output 6.95 L/min  Fick Cardiac Output Index 4.01 (L/min)/BSA  Aortic Mean Gradient 26.81 mmHg  Aortic Peak Gradient 8 mmHg  Aortic Valve Area 1.00  Aortic Value Area Index 0.58 cm2/BSA  RA A Wave 3 mmHg  RA V Wave 3 mmHg  RA Mean 1 mmHg  RV Systolic Pressure 30 mmHg  RV Diastolic Pressure -1 mmHg  RV EDP 1 mmHg  PA Systolic Pressure 23 mmHg  PA Diastolic Pressure 7 mmHg  PA Mean 12 mmHg  PW A Wave 8 mmHg  PW V Wave 6 mmHg  PW Mean 5 mmHg  AO Systolic Pressure 1696mmHg  AO Diastolic Pressure 61 mmHg  AO Mean 94 mmHg  LV Systolic Pressure 1789mmHg  LV Diastolic Pressure 5 mmHg  LV EDP 12 mmHg  AOp Systolic Pressure 1381mmHg  AOp Diastolic Pressure 59 mmHg  AOp Mean Pressure 94 mmHg  LVp Systolic Pressure 1017mmHg  LVp Diastolic Pressure 4 mmHg  LVp EDP Pressure 12 mmHg  QP/QS 1  TPVR Index 2.99 HRUI  TSVR Index 23.41 HRUI  PVR SVR Ratio 0.08  TPVR/TSVR Ratio 0.13    ADDENDUM REPORT: 12/02/2019 13:26  EXAM: OVER-READ INTERPRETATION  CT CHEST  The following report is an over-read performed by radiologist Dr. JSamara SnideGIron [facility] HospitalRadiology, PA on 12/02/2019. This over-read does not include interpretation of cardiac or coronary anatomy or pathology. The coronary CTA interpretation by the cardiologist is attached.  COMPARISON:  11/26/2019 chest CT angiogram.  FINDINGS: Please see the separate concurrent chest CT angiogram report for details.  IMPRESSION: Please see the separate concurrent chest CT angiogram report for details.   Electronically Signed   By: JIlona SorrelM.D.    On: 12/02/2019 13:26   Addended by PSharyn Blitz MD on 12/02/2019 1:28 PM  Study Result  Addenda  ADDENDUM REPORT: 12/02/2019 13:26  EXAM: OVER-READ INTERPRETATION  CT CHEST  The following report is an over-read performed by radiologist Dr. JSamara SnideGMargaret R. Pard[facility] HospitalRadiology, PTurinon 12/02/2019. This over-read does not include interpretation of cardiac or coronary anatomy or pathology. The coronary CTA interpretation by the cardiologist is attached.  COMPARISON:  11/26/2019 chest CT angiogram.  FINDINGS: Please see the separate concurrent chest CT angiogram report for details.  IMPRESSION: Please see the separate concurrent chest CT angiogram report for details.   Electronically Signed   By: JIlona SorrelM.D.   On: 12/02/2019 13:26   Signed by PSharyn Blitz MD on 12/02/2019 1:28 PM  Narrative & Impression  CLINICAL DATA:  Severe Aortic Stenosis.  EXAM: Cardiac TAVR CT  TECHNIQUE: The  patient was scanned on a Phillips Force scanner. A 120 kV retrospective scan was triggered in the descending thoracic aorta at 111 HU's. Gantry rotation speed was 250 msecs and collimation was .6 mm. No beta blockade or nitro were given. The 3D data set was reconstructed in 5% intervals of the R-R cycle. Systolic and diastolic phases were analyzed on a dedicated work station using MPR, MIP and VRT modes. The patient received 80 cc of contrast.  FINDINGS: Image quality: Excellent.  Noise artifact is: Limited.  Valve Morphology: The aortic valve is tricuspid. The leaflets are severely calcified and severely thickened. Leaflet calcification appears diffuse. The leaflets exhibit severely restricted movement in systole  Aortic Valve Calcium score: 2336  Aortic annular dimension:  Phase assessed: 20%  Annular area: 462 mm2  Annular perimeter: 77.3 mm  Max diameter: 26.1 mm  Min diameter: 22.9 mm  Annular and subannular calcification:  None.  Optimal coplanar projection: LAO 1 CRA 3  Coronary Artery Height above Annulus:  Left Main: 15.9 mm  Right Coronary: 16.3 mm  Sinus of Valsalva Measurements:  Non-coronary: 34 mm  Right-coronary: 33 mm  Left-coronary: 34 mm  Sinus of Valsalva Height:  Non-coronary: 26.0 mm  Right-coronary: 23.2 mm  Left-coronary: 22.5 mm  Sinotubular Junction: 27 mm with mild to moderate calcifications.  Ascending Thoracic Aorta: 31 mm with diffuse mixed density atherosclerosis noted.  Coronary Arteries: CAC score of 802, which is 80th percentile for age- and sex-matched controls. Normal coronary origin. Right dominance. The study was performed without use of NTG and is insufficient for plaque evaluation. The following assessment was made nonetheless:  Left main: The left main is a large caliber vessel with a normal take off from the left coronary cusp that trifurcates into a LAD, LCX, and ramus intermedius. There is minimal mixed density plaque (<25%).  Left anterior descending artery: The proximal LAD contains mild mixed density plaque (25-49%). The mid LAD contains minimal calcified plaque (<25%). The distal LAD contains minimal non-calcified plaque (<25%).  Ramus intermedius: Large vessel with minimal calcified plaque (<25%).  Left circumflex artery: The LCX is non-dominant with minimal non-calcified plaque (<25%).  Right coronary artery: Dominant with normal take off from the RCC. The proximal and mid RCA segments contain minimal calcified plaque (<25%). The distal RCA contains minimal non-calcified plaque. The RCA terminates as a patent PDA and PLV branch.  Cardiac Morphology:  Right Atrium: Right atrial size is within normal limits.  Right Ventricle: The right ventricular cavity is within normal limits.  Left Atrium: Left atrial size is normal in size with no left atrial appendage filling defect.  Left Ventricle: The  ventricular cavity size is within normal limits. There are no stigmata of prior infarction. There is no abnormal filling defect. Normal left ventricular function, LVEF=72%. No regional wall motion abnormalities.  Pulmonary arteries: Normal in size without proximal filling defect.  Pulmonary veins: Normal pulmonary venous drainage.  Pericardium: Normal thickness with no significant effusion or calcium present.  Mitral Valve: The mitral valve is normal structure with mild mitral annular calcification.  Extra-cardiac findings: See attached radiology report for non-cardiac structures.  IMPRESSION: 1. 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  2. Annular measurements appropriate for 26 mm Edwards Sapien 3 TAVR  3. No significant annular or subannular calcifications.  4. Sufficient coronary to annulus distance.  5. Optimal Fluoroscopic Angle for Delivery: LAO 1 CRA 3  6. Coronary calcium score of 802 which is 80th percentile   for age- and sex-matched controls.  7. 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  Ronneby T. O'Neal, MD  Electronically Signed: By: [place]  O'Neal On: 12/01/2019 22:06    CLINICAL DATA:  Inpatient. Severe aortic stenosis. Pre-TAVR planning.  EXAM: CT ANGIOGRAPHY CHEST, ABDOMEN AND PELVIS  TECHNIQUE: Multidetector CT imaging through the chest, abdomen and pelvis was performed using the standard protocol during bolus administration of intravenous contrast. Multiplanar reconstructed images and MIPs were obtained and reviewed to evaluate the vascular anatomy.  CONTRAST:  95mL OMNIPAQUE IOHEXOL 350 MG/ML SOLN  COMPARISON:  11/26/2019 CT angiogram of the chest, abdomen and pelvis.  FINDINGS: CTA CHEST FINDINGS  Cardiovascular: Normal heart size. No significant  pericardial effusion/thickening. Diffuse thickening and coarse calcification of the aortic valve. Three-vessel coronary atherosclerosis. Atherosclerotic nonaneurysmal thoracic aorta. Normal caliber pulmonary arteries. No central pulmonary emboli.  Mediastinum/Nodes: Scattered subcentimeter hypodense bilateral thyroid nodules. Not clinically significant; no follow-up imaging recommended (ref: J Am Coll Radiol. 2015 Feb;12(2): 143-50). Unremarkable esophagus. No pathologically enlarged axillary, mediastinal or hilar lymph nodes.  Lungs/Pleura: No pneumothorax. No pleural effusion. No acute consolidative airspace disease or lung masses. Anterior left upper lobe tiny 3 mm solid pulmonary nodule (series 5/image 46), stable since recent chest CT. No additional significant pulmonary nodules.  Musculoskeletal: No aggressive appearing focal osseous lesions. Moderate thoracic spondylosis. Symmetric moderate bilateral gynecomastia, stable.  CTA ABDOMEN AND PELVIS FINDINGS  Hepatobiliary: Normal liver with no liver mass. Stable distended gallbladder with layering subcentimeter calcified gallstones. No gallbladder wall thickening or pericholecystic fluid. No biliary ductal dilatation.  Pancreas: Normal, with no mass or duct dilation.  Spleen: Normal size. No mass.  Adrenals/Urinary Tract: Left adrenal 2.6 cm nodule with density 80 HU and right adrenal 1.7 cm nodule with density 57 HU, both characterized as benign adenomas on recent noncontrast chest CT. No overt hydronephrosis. No contour deforming renal masses. Normal nondistended bladder.  Stomach/Bowel: Normal non-distended stomach. Normal caliber small bowel with no small bowel wall thickening. Normal appendix. Normal large bowel with no diverticulosis, large bowel wall thickening or pericolonic fat stranding.  Vascular/Lymphatic: Atherosclerotic nonaneurysmal abdominal aorta. No pathologically enlarged lymph nodes in the  abdomen or pelvis.  Reproductive: Mild prostatomegaly. Brachytherapy seeds scattered throughout the prostate.  Other: No pneumoperitoneum, ascites or focal fluid collection. Small fat containing left inguinal hernia.  Musculoskeletal: No aggressive appearing focal osseous lesions. Mild lumbar spondylosis.  VASCULAR MEASUREMENTS PERTINENT TO TAVR:  AORTA:  Minimal Aortic Diameter-14.8 x 13.9 mm  Severity of Aortic Calcification-moderate to severe  RIGHT PELVIS:  Right Common Iliac Artery -  Minimal Diameter-10.2 x 6.0 mm  Tortuosity-mild  Calcification-severe  Right External Iliac Artery -  Minimal Diameter-5.5 x 4.6 mm  Tortuosity-mild-to-moderate  Calcification-severe  Right Common Femoral Artery -  Minimal Diameter-7.3 x 6.7 mm  Tortuosity-mild  Calcification-moderate  LEFT PELVIS:  Left Common Iliac Artery -  Minimal Diameter-9.3 x 8.2 mm  Tortuosity-mild  Calcification-severe  Left External Iliac Artery -  Minimal Diameter-7.1 x 5.1 mm  Tortuosity-mild mild  Calcification-severe  Left Common Femoral Artery -  Minimal Diameter-8.3 x 7.0 mm  Tortuosity-mild  Calcification-moderate  Review of the MIP images confirms the above findings.  IMPRESSION: 1. Vascular findings and measurements pertinent to potential TAVR procedure, as detailed. 2. Diffuse thickening and coarse calcification of the aortic valve, compatible with the reported history of severe aortic stenosis. 3. Three-vessel coronary atherosclerosis. 4.   Tiny 3 mm solid left upper lobe pulmonary nodule. No follow-up needed if patient is low-risk. Non-contrast chest CT can be considered in 12 months if patient is high-risk. This recommendation follows the consensus statement: Guidelines for Management of Incidental Pulmonary Nodules Detected on CT Images: From the Fleischner Society 2017; Radiology 2017; 284:228-243. 5. Cholelithiasis. 6.  Bilateral adrenal adenomas. 7. Mild prostatomegaly. 8. Small fat containing left inguinal hernia. 9. Aortic Atherosclerosis (ICD10-I70.0).   Electronically Signed   By: Ilona Sorrel M.D.   On: 12/02/2019 13:55   STS Risk Calculator:  Procedure: Isolated AVR Risk of Mortality: 4.160% Renal Failure: NA Permanent Stroke: 2.342% Prolonged Ventilation: 11.722% DSW Infection: 0.185% Reoperation: 3.991% Morbidity or Mortality: 20.422% Short Length of Stay: 15.658% Long Length of Stay: 9.915%  Impression:  This 79 year old gentleman has stage D, severe, symptomatic aortic stenosis with New York Heart Association class II symptoms of exertional fatigue and shortness of breath consistent with chronic diastolic congestive heart failure.  I have personally reviewed his 2D echocardiogram, cardiac catheterization, and CTA studies.  His echocardiogram shows a calcified aortic valve with a mean transvalvular gradient of 26 mmHg and a peak gradient of 55 mmHg suggesting moderate aortic stenosis.  His dimensionless index was 0.31 with a calculated aortic valve area of 0.89 cm.  His exam and symptoms seem more consistent with severe aortic stenosis.  His gated cardiac CTA shows a severely calcified and thickened aortic valve with markedly restricted leaflet mobility consistent with severe aortic stenosis.  I agree that aortic valve replacement is indicated in this patient for relief of his symptoms and to prevent progressive left ventricular deterioration and difficulty with hemodialysis.  At 79 years old with end-stage renal disease on hemodialysis and multiple other comorbid risk factors I do not think he is a candidate for open surgical aortic valve placement.  I think transcatheter aortic valve replacement would be the best option for him.  His gated cardiac CTA shows anatomy suitable for transcatheter aortic valve replacement using a SAPIEN 3 valve.  His abdominal and pelvic CTA shows  significant aortoiliac atherosclerotic disease but there appears to be adequate vessels to allow left transfemoral access.  The patient and his daughter were counseled at length regarding treatment alternatives for management of severe symptomatic aortic stenosis. The risks and benefits of surgical intervention has been discussed in detail. Long-term prognosis with medical therapy was discussed. Alternative approaches such as conventional surgical aortic valve replacement, transcatheter aortic valve replacement, and palliative medical therapy were compared and contrasted at length. This discussion was placed in the context of the patient's own specific clinical presentation and past medical history. All of their questions have been addressed.   Following the decision to proceed with transcatheter aortic valve replacement, a discussion was held regarding what types of management strategies would be attempted intraoperatively in the event of life-threatening complications, including whether or not the patient would be considered a candidate for the use of cardiopulmonary bypass and/or conversion to open sternotomy for attempted surgical intervention.  I do not think he is a candidate for emergent sternotomy to manage any intraoperative complications given his advanced age and end-stage renal disease on hemodialysis.  The patient is aware of the fact that transient use of cardiopulmonary bypass may be necessary. The patient has been advised of a variety of complications that might develop including but not limited to risks of death, stroke, paravalvular leak, aortic dissection or other major vascular complications, aortic annulus rupture, device embolization, cardiac rupture or  perforation, mitral regurgitation, acute myocardial infarction, arrhythmia, heart block or bradycardia requiring permanent pacemaker placement, congestive heart failure, respiratory failure, renal failure, pneumonia, infection, other late  complications related to structural valve deterioration or migration, or other complications that might ultimately cause a temporary or permanent loss of functional independence or other long term morbidity. The patient provides full informed consent for the procedure as described and all questions were answered.     Plan:  He is scheduled for dental extractions on Wednesday 12/30/2019.  He would then be scheduled for transfemoral TAVR a few weeks later.   I spent 60 minutes performing this consultation and > 50% of this time was spent face to face counseling and coordinating the care of this patient's severe symptomatic aortic stenosis.    Gaye Pollack, MD 12/23/2019 1:28 PM

## 2019-12-23 NOTE — Progress Notes (Signed)
Patient ID: Elijah White, male   DOB: Aug 03, 1940, 79 y.o.   MRN: [id number]  [place] SURGERY CONSULTATION REPORT  Referring Provider is O'Neal, Cassie Freer, * Primary Cardiologist is No primary care provider on file. PCP is [name], MD  Chief Complaint  Patient presents with  . 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  He was not seen by cardiology.  He was readmitted on 11/26/2019 after he presented via EMS with chest and epigastric pain as well as shortness of breath while going to hemodialysis. HS troponin was 36--> 37.  Electrocardiogram showed right bundle branch block with no acute changes.  A chest x-ray showed pulmonary vascular congestion.  He had a CTA of the chest, abdomen, and pelvis that was negative for PE and aortic dissection.  There was a 2.3 cm enhancing mass within the lumen of the mid transverse colon concerning for neoplasm as well as a distended gallbladder with numerous calcified gallstones and a 2.7 cm indeterminate left adrenal mass.  He was noted to have a murmur on exam and cardiology consultation was obtained.  He underwent colonoscopy which showed a polyp without evidence of malignancy.  2D echocardiogram showed moderate to severe aortic stenosis with a peak velocity of 3.7 m/s and a mean gradient of 26 mmHg.  Aortic valve area was 1 cm with a dimensionless index of 0.31.  Left ventricular systolic function was normal.  His murmur was felt to be consistent with severe aortic stenosis.  His gated cardiac CTA showed a calcium score of 2336 with a trileaflet aortic valve that  was severely calcified and thickened.  Leaflets had severely restricted mobility.  This was felt to be consistent with severe aortic stenosis.  He underwent cardiac catheterization on 12/03/2019 which showed mild nonobstructive coronary disease.  The mean gradient across aortic valve was 26.8 mmHg.  The patient is here today with his daughter.  He has been a widower for 14 years and lives by himself in an apartment in [place].  He has 2 children with his daughter living locally.  His daughter and his knees help him get things from the store but he takes care of himself independently at home.  He does use a walker for assistance.  He was noted to have poor dentition when evaluated by cardiology and underwent dental evaluation.  He has been scheduled for dental extractions on Wednesday, 12/30/2019.  He says that he has been doing fairly well at home.  He does have some exertional shortness of breath and fatigue.  He denies any chest pain or pressure.  He has had no peripheral edema.  He denies dizziness and syncope.  Past Medical History:  Diagnosis Date  . Anemia    low iron  . Arthritis   . Cancer [facility]) ?2006   prostate  . Chronic kidney disease    Stage 4  . Colon polyps ~ 1993 and 2003   Dr [name], [facility].  08/2001 colonoscopy: tubular adenoma at cecum.    . Diabetes mellitus without complication ([place])   . Elevated cholesterol with high triglycerides   . GERD (gastroesophageal reflux disease)   .  Hypertension   . Legally blind in left eye, as defined in [place]    has pinpoint vision in right eye    Past Surgical History:  Procedure Laterality Date  . AV FISTULA PLACEMENT Left 03/20/2018   Procedure: ARTERIOVENOUS (AV) FISTULA CREATION ARM;  Surgeon: [name], MD;  Location: [place];  Service: Vascular;  Laterality: Left;  . BASCILIC VEIN TRANSPOSITION Left 05/21/2018   Procedure: LEFT BASILIC VEIN FISTULA SECOND STAGE;  Surgeon: [name], MD;  Location:  [place];  Service: Vascular;  Laterality: Left;  . COLONOSCOPY    . COLONOSCOPY WITH PROPOFOL N/A 11/30/2019   Procedure: COLONOSCOPY WITH PROPOFOL;  Surgeon: Ronnette Juniper, MD;  Location: [place];  Service: Gastroenterology;  Laterality: N/A;  . GLAUCOMA SURGERY  2019   multiple surgeries  . HEMOSTASIS CLIP PLACEMENT  11/30/2019   Procedure: HEMOSTASIS CLIP PLACEMENT;  Surgeon: Ronnette Juniper, MD;  Location: [place];  Service: Gastroenterology;;  . KNEE ARTHROSCOPY    . POLYPECTOMY  11/30/2019   Procedure: POLYPECTOMY;  Surgeon: Ronnette Juniper, MD;  Location: [place];  Service: Gastroenterology;;  . PROSTATE SURGERY    . RIGHT/LEFT HEART CATH AND CORONARY ANGIOGRAPHY N/A 12/03/2019   Procedure: RIGHT/LEFT HEART CATH AND CORONARY ANGIOGRAPHY;  Surgeon: Burnell Blanks, MD;  Location: [place] CV [facility];  Service: Cardiovascular;  Laterality: N/A;  . SUBMUCOSAL TATTOO INJECTION  11/30/2019   Procedure: SUBMUCOSAL TATTOO INJECTION;  Surgeon: Ronnette Juniper, MD;  Location: [facility] ENDOSCOPY;  Service: Gastroenterology;;    Family History  Problem Relation Age of Onset  . CAD Mother     Social History   Socioeconomic History  . Marital status: Widowed    Spouse name: Not on file  . Number of children: Not on file  . Years of education: Not on file  . Highest education level: Not on file  Occupational History  . Occupation: retired  Tobacco Use  . Smoking status: Former Smoker    Packs/day: 0.50    Quit date: 03/23/2018    Years since quitting: 1.7  . Smokeless tobacco: Never Used  Vaping Use  . Vaping Use: Never used  Substance and Sexual Activity  . Alcohol use: No  . Drug use: No  . Sexual activity: Not Currently  Other Topics Concern  . Not on file  Social History Narrative  . Not on file   Social Determinants of Health   Financial Resource Strain: Low Risk   . Difficulty of Paying Living Expenses: Not hard at all  Food Insecurity: No Food Insecurity  . Worried  About Charity fundraiser in the Last Year: Never true  . Ran Out of Food in the Last Year: Never true  Transportation Needs: No Transportation Needs  . Lack of Transportation (Medical): No  . Lack of Transportation (Non-Medical): No  Physical Activity: Inactive  . Days of Exercise per Week: 0 days  . Minutes of Exercise per Session: 0 min  Stress: No Stress Concern Present  . Feeling of Stress : Not at all  Social Connections:   . Frequency of Communication with Friends and Family: Not on file  . Frequency of Social Gatherings with Friends and Family: Not on file  . Attends Religious Services: Not on file  . Active Member of Clubs or Organizations: Not on file  . Attends Archivist Meetings: Not on file  . Marital Status: Not on file  Intimate Partner Violence:   . Fear of Current or  Ex-Partner: Not on file  . Emotionally Abused: Not on file  . Physically Abused: Not on file  . Sexually Abused: Not on file    Current Outpatient Medications  Medication Sig Dispense Refill  . acetaminophen (TYLENOL) 500 MG tablet Take 2 tablets (1,000 mg total) by mouth every 6 (six) hours as needed for mild pain, moderate pain or headache. 60 tablet 0  . apraclonidine (IOPIDINE) 0.5 % ophthalmic solution Place 1 drop into the right eye 3 (three) times daily.     [name] atorvastatin (LIPITOR) 10 MG tablet TAKE 1 TABLET BY MOUTH EVERY DAY (Patient taking differently: Take 10 mg by mouth daily. ) 90 tablet 0  . B Complex-C-Folic Acid (DIALYVITE 878) 0.8 MG TABS Take 1 tablet by mouth daily.    . BD PEN NEEDLE NANO U/F 32G X 4 MM MISC USE SUBCUTANEOUS FOUR TIMES A DAY    . blood glucose meter kit and supplies KIT Dispense based on patient and insurance preference. Use up to four times daily as directed. (FOR ICD-9 250.00, 250.01). For QAC - HS accuchecks. 1 each 1  . carvedilol (COREG) 3.125 MG tablet TAKE 1 TABLET(3.125 MG) BY MOUTH TWICE DAILY WITH A MEAL (Patient taking differently: Take 3.125 mg  by mouth 2 (two) times daily with a meal. ) 180 tablet 1  . diclofenac Sodium (VOLTAREN) 1 % GEL Apply 4 g topically 4 (four) times daily as needed. (Patient taking differently: Apply 4 g topically 4 (four) times daily as needed (pain). ) 100 g 0  . dorzolamide-timolol (COSOPT) 22.3-6.8 MG/ML ophthalmic solution Place 1 drop into the right eye 2 (two) times daily.  11  . esomeprazole (NEXIUM) 40 MG capsule TAKE 1 CAPSULE BY MOUTH EVERY DAY (Patient taking differently: Take 40 mg by mouth every other day. ) 90 capsule 1  . ferric citrate (AURYXIA) 1 GM 210 MG(Fe) tablet Take 420 mg by mouth 3 (three) times daily with meals.    . gabapentin (NEURONTIN) 100 MG capsule TAKE 1 CAPSULE(100 MG) BY MOUTH AT BEDTIME (Patient taking differently: Take 100 mg by mouth at bedtime. ) 90 capsule 1  . hydrALAZINE (APRESOLINE) 25 MG tablet TAKE 1 TABLET(25 MG) BY MOUTH TWICE DAILY (Patient taking differently: Take 25 mg by mouth in the morning and at bedtime. ) 180 tablet 1  . hydroxypropyl methylcellulose / hypromellose (ISOPTO TEARS / GONIOVISC) 2.5 % ophthalmic solution Place 1 drop into the left eye as needed for dry eyes.    . isosorbide mononitrate (IMDUR) 30 MG 24 hr tablet TAKE 1 TABLET(30 MG) BY MOUTH DAILY (Patient taking differently: Take 30 mg by mouth daily. ) 90 tablet 1  . Lancets (ONETOUCH DELICA PLUS [id number]) MISC USE AS DIRECTED UP TO FOUR TIMES A DAY    . latanoprost (XALATAN) 0.005 % ophthalmic solution Place 1 drop into the right eye at bedtime.    [name] test strip USE AS DIRECTED TO TEST FOUR TIMES A DAY 100 each 3  . pilocarpine (PILOCAR) 4 % ophthalmic solution Place 1 drop into the right eye 4 (four) times daily.     . tamsulosin (FLOMAX) 0.4 MG CAPS capsule Take 1 capsule (0.4 mg total) by mouth at bedtime. (Patient not taking: Reported on 12/23/2019) 30 capsule 0   No current facility-administered medications for this visit.    No Known Allergies    Review of  Systems:   General:  normal appetite, + decreased energy, no weight gain, + weight loss, no  fever  Cardiac:  no chest pain with exertion, no chest pain at rest, +SOB with  exertion, + resting SOB, no PND, no orthopnea, no palpitations, no arrhythmia, no atrial fibrillation, no LE edema, no dizzy spells, no syncope  Respiratory:  + shortness of breath, no home oxygen, no productive cough, no dry cough, no bronchitis, no wheezing, no hemoptysis, no asthma, no pain with inspiration or cough, no sleep apnea, no CPAP at night  GI:   no difficulty swallowing, + reflux, + frequent heartburn, no hiatal hernia, no abdominal pain, no constipation, no diarrhea, no hematochezia, no hematemesis, no melena  GU:   no dysuria,  no frequency, no urinary tract infection, no hematuria, no enlarged prostate, no kidney stones, + chronic kidney disease  Vascular:  no pain suggestive of claudication, no pain in feet, no leg cramps, no varicose veins, no DVT, no non-healing foot ulcer  Neuro:   no stroke, no TIA's, no seizures, no headaches, no temporary blindness one eye,  no slurred speech, + peripheral neuropathy, no chronic pain, no instability of gait, no memory/cognitive dysfunction  Musculoskeletal: + arthritis, + joint swelling, no myalgias, no difficulty walking, normal mobility but uses walker for balance due to blindness.  Skin:   no rash, no itching, no skin infections, no pressure sores or ulcerations  Psych:   no anxiety, no depression, no nervousness, no unusual recent stress  Eyes:   no blurry vision, no floaters, no recent vision changes, does not wear glasses or contacts  ENT:   no hearing loss, + loose or painful teeth, no dentures, last saw dentist 12/14/19, Dr. Sandi Mariscal. Scheduled for extractions.  Hematologic:  no easy bruising, no abnormal bleeding, no clotting disorder, no frequent epistaxis  Endocrine:  + diabetes, does not check CBG's at home       Physical Exam:   BP 127/80   Pulse 76    Temp 97.6 F (36.4 C) (Skin)   Resp 20   Ht 5' 1" (1.549 m)   Wt 167 lb (75.8 kg)   SpO2 98% Comment: RA  BMI 31.55 kg/m   General:  Elderly,  well-appearing  HEENT:   Multiple broken off teeth.  Neck:   no JVD, no bruits, no adenopathy   Chest:   clear to auscultation, symmetrical breath sounds, no wheezes, no rhonchi   CV:   RRR, grade lll/VI crescendo/decrescendo murmur heard best at RSB,  no diastolic murmur  Abdomen:  soft, non-tender, no masses   Extremities:  warm, well-perfused, pulses palpable at ankle, no LE edema, left upper arm AV fistula with a palpable thrill.  Rectal/GU  Deferred  Neuro:   Grossly non-focal and symmetrical throughout  Skin:   Clean and dry, no rashes, no breakdown   Diagnostic Tests:  ECHOCARDIOGRAM REPORT       Patient Name:  WESTYN DRIGGERS Date of Exam: 11/28/2019  Medical Rec #: [id number]    Height:    60.0 in  Accession #:  [id number]   Weight:    162.5 lb  Date of Birth: [birth year]    BSA:     1.709 m  Patient Age:  24 years    BP:      155/71 mmHg  Patient Gender: M        HR:      76 bpm.  Exam Location: Inpatient   Procedure: 2D Echo   Indications:  chest pain 786.50    History:    Patient has prior  history of Echocardiogram examinations,  most         recent 09/02/2017. End stage renal disease, Aortic Valve  Disease;         Risk Factors:Diabetes and Dyslipidemia.    Sonographer:  Johny Chess  Referring Phys: [id number] [place]     Sonographer Comments: Image acquisition challenging due to respiratory  motion.  IMPRESSIONS    1. Left ventricular ejection fraction, by estimation, is 65 to 70%. The  left ventricle has normal function. The left ventricle has no regional  wall motion abnormalities. Left ventricular diastolic parameters are  indeterminate.  2. Right ventricular systolic function is normal. The right ventricular  size  is normal. Tricuspid regurgitation signal is inadequate for assessing  PA pressure.  3. The mitral valve is degenerative. Trivial mitral valve regurgitation.  No evidence of mitral stenosis. Severe mitral annular calcification.  4. The aortic valve is calcified. There is severe calcifcation of the  aortic valve. There is severe thickening of the aortic valve. Aortic valve  regurgitation is trivial. 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  5. The inferior vena cava is normal in size with greater than 50%  respiratory variability, suggesting right atrial pressure of 3 mmHg.   FINDINGS  Left Ventricle: Left ventricular ejection fraction, by estimation, is 65  to 70%. The left ventricle has normal function. The left ventricle has no  regional wall motion abnormalities. The left ventricular internal cavity  size was normal in size. There is  no left ventricular hypertrophy. Left ventricular diastolic parameters  are indeterminate.   Right Ventricle: The right ventricular size is normal. No increase in  right ventricular wall thickness. Right ventricular systolic function is  normal. Tricuspid regurgitation signal is inadequate for assessing PA  pressure.   Left Atrium: Left atrial size was normal in size.   Right Atrium: Right atrial size was normal in size.   Pericardium: There is no evidence of pericardial effusion.   Mitral Valve: The mitral valve is degenerative in appearance. Severe  mitral annular calcification. Trivial mitral valve regurgitation. No  evidence of mitral valve stenosis.   Tricuspid Valve: The tricuspid valve is grossly normal. Tricuspid valve  regurgitation is not demonstrated.   Aortic Valve: The aortic valve is calcified. There is severe calcifcation  of the aortic valve. There is severe thickening of the aortic valve.  Aortic valve regurgitation is trivial. Moderate to severe aortic stenosis  is present. Aortic valve mean  gradient measures 26.0  mmHg. Aortic valve peak gradient measures 54.8  mmHg. Aortic valve area, by VTI measures 1.06 cm.   Pulmonic Valve: The pulmonic valve was not well visualized. Pulmonic valve  regurgitation is not visualized.   Aorta: The aortic root is normal in size and structure.   Venous: The inferior vena cava is normal in size with greater than 50%  respiratory variability, suggesting right atrial pressure of 3 mmHg.   IAS/Shunts: No atrial level shunt detected by color flow Doppler.     LEFT VENTRICLE  PLAX 2D  LVIDd:     3.90 cm Diastology  LVIDs:     2.35 cm LV e' medial:  4.57 cm/s  LV PW:     1.00 cm LV E/e' medial: 15.5  LV IVS:    0.90 cm LV e' lateral:  8.05 cm/s  LVOT diam:   2.08 cm LV E/e' lateral: 8.8  LV SV:     76  LV SV Index:  44  LVOT Area:  3.40 cm     RIGHT VENTRICLE       IVC  RV S prime:   11.00 cm/s IVC diam: 1.25 cm  TAPSE (M-mode): 2.0 cm   LEFT ATRIUM       Index    RIGHT ATRIUM     Index  LA diam:    3.40 cm 1.99 cm/m RA Area:   9.48 cm  LA Vol (A2C):  53.7 ml 31.42 ml/m RA Volume:  18.10 ml 10.59 ml/m  LA Vol (A4C):  30.6 ml 17.91 ml/m  LA Biplane Vol: 41.8 ml 24.46 ml/m  AORTIC VALVE  AV Area (Vmax):  0.89 cm  AV Area (Vmean):  0.97 cm  AV Area (VTI):   1.06 cm  AV Vmax:      370.00 cm/s  AV Vmean:     231.000 cm/s  AV VTI:      0.716 m  AV Peak Grad:   54.8 mmHg  AV Mean Grad:   26.0 mmHg  LVOT Vmax:     96.71 cm/s  LVOT Vmean:    66.068 cm/s  LVOT VTI:     0.223 m  LVOT/AV VTI ratio: 0.31    AORTA  Ao Root diam: 3.30 cm   MITRAL VALVE  MV Area (PHT): 1.94 cm   SHUNTS  MV Decel Time: 391 msec   Systemic VTI: 0.22 m  MV E velocity: 71.00 cm/s  Systemic Diam: 2.08 cm  MV A velocity: 140.00 cm/s  MV E/A ratio: 0.51   Mihai Croitoru MD  Electronically signed by Sanda Klein MD  Signature Date/Time:  11/29/2019/8:09:10 AM     Physicians  Panel Physicians Referring Physician Case Authorizing Physician  Burnell Blanks, MD (Primary)    Procedures  RIGHT/LEFT HEART CATH AND CORONARY ANGIOGRAPHY  Conclusion    Prox RCA to Mid RCA lesion is 20% stenosed.  Dist RCA lesion is 20% stenosed.  RPAV lesion is 40% stenosed.  Mid Cx lesion is 20% stenosed.  Prox LAD lesion is 30% stenosed.   1. Mild non-obstructive CAD 2. Severe aortic stenosis by echo (cath mean gradient 26.8 mmHg0  Recommendations: will continue workup for TAVR. He has had his scans. He can likely be discharged home later today and we will complete his TAVR workup as an outpatient.   Recommendations  Antiplatelet/Anticoag Will continue workup for TAVR.  Surgeon Notes    11/30/2019 3:42 PM GI Operative Note revision by Ronnette Juniper, MD    11/30/2019 9:05 AM Brief Op Note signed by Ronnette Juniper, MD  Indications  Severe aortic stenosis [I35.0 (ICD-10-CM)]  Procedural Details  Technical Details Indication: Severe aortic stenosis  Procedure: The risks, benefits, complications, treatment options, and expected outcomes were discussed with the patient. The patient and/or family concurred with the proposed plan, giving informed consent. The patient was brought to the cath lab after IV hydration was given. The patient was not sedated.  The right groin was prepped and draped in the usual manner. Using the modified Seldinger access technique, a 5 French sheath was placed in the right femoral artery and a 7 French sheath was placed in the right femoral vein using u/s guidance. Right heart catheterization performed with a Swan-Ganz catheter. Standard diagnostic catheters were used to perform selective coronary angiography. I crossed the aortic valve with an AL-1 catheter and a straight wire. Mynx closure device placed in the right femoral artery.   There were no immediate complications. The patient was taken to the  recovery  area in stable condition.   Estimated blood loss <50 mL.   During this procedure no sedation was administered.  Medications (Filter: Administrations occurring from 0730 to 0822 on 12/03/19) (important) Continuous medications are totaled by the amount administered until 12/03/19 0822.  lidocaine (PF) (XYLOCAINE) 1 % injection (mL) Total volume:  15 mL Date/Time  Rate/Dose/Volume Action  12/03/19 0748  15 mL Given    Heparin (Porcine) in NaCl 1000-0.9 UT/500ML-% SOLN (mL) Total volume:  1,000 mL Date/Time  Rate/Dose/Volume Action  12/03/19 0815  500 mL Given  0815  500 mL Given    iohexol (OMNIPAQUE) 350 MG/ML injection (mL) Total volume:  50 mL Date/Time  Rate/Dose/Volume Action  12/03/19 0819  50 mL Given    apraclonidine (IOPIDINE) 0.5 % ophthalmic solution 1 drop (drop) Total dose:  Cannot be calculated* *Administration dose not documented Date/Time  Rate/Dose/Volume Action  12/03/19 0730  *Not included in total MAR Hold    atorvastatin (LIPITOR) tablet 10 mg (mg) Total dose:  Cannot be calculated* *Administration dose not documented Date/Time  Rate/Dose/Volume Action  12/03/19 0730  *Not included in total MAR Hold    carvedilol (COREG) tablet 3.125 mg (mg) Total dose:  Cannot be calculated* *Administration dose not documented Date/Time  Rate/Dose/Volume Action  12/03/19 0730  *Not included in total MAR Hold    Chlorhexidine Gluconate Cloth 2 % PADS 6 each (each) Total dose:  Cannot be calculated* Dosing weight:  73.5 *Administration dose not documented Date/Time  Rate/Dose/Volume Action  12/03/19 0730  *Not included in total MAR Hold  0800  *Not included in total Automatically Held    diclofenac Sodium (VOLTAREN) 1 % topical gel 4 g (g) Total dose:  Cannot be calculated* Dosing weight:  74.2 *Administration dose not documented Date/Time  Rate/Dose/Volume Action  12/03/19 0730  *Not included in total MAR Hold    dorzolamide-timolol (COSOPT) 22.3-6.8  MG/ML ophthalmic solution 1 drop (drop) Total dose:  Cannot be calculated* Dosing weight:  74.2 *Administration dose not documented Date/Time  Rate/Dose/Volume Action  12/03/19 0730  *Not included in total MAR Hold    doxercalciferol (HECTOROL) injection 7 mcg (mcg) Total dose:  Cannot be calculated* Dosing weight:  75.5 *Administration dose not documented Date/Time  Rate/Dose/Volume Action  12/03/19 0730  *Not included in total MAR Hold    ferric citrate (AURYXIA) tablet 420 mg (mg) Total dose:  Cannot be calculated* *Administration dose not documented Date/Time  Rate/Dose/Volume Action  12/03/19 0730  *Not included in total MAR Hold    gabapentin (NEURONTIN) capsule 100 mg (mg) Total dose:  Cannot be calculated* *Administration dose not documented Date/Time  Rate/Dose/Volume Action  12/03/19 0730  *Not included in total MAR Hold    heparin injection 5,000 Units (Units) Total dose:  Cannot be calculated* Dosing weight:  78 *Administration dose not documented Date/Time  Rate/Dose/Volume Action  12/03/19 0730  *Not included in total MAR Hold    hydrALAZINE (APRESOLINE) injection 10 mg (mg) Total dose:  Cannot be calculated* Dosing weight:  78 *Administration dose not documented Date/Time  Rate/Dose/Volume Action  12/03/19 0730  *Not included in total MAR Hold    hydrALAZINE (APRESOLINE) tablet 25 mg (mg) Total dose:  Cannot be calculated* *Administration dose not documented Date/Time  Rate/Dose/Volume Action  12/03/19 0730  *Not included in total MAR Hold    hydrocortisone cream 1 % 1 application (application) Total dose:  Cannot be calculated* Dosing weight:  76.2 *Administration dose not documented Date/Time  Rate/Dose/Volume Action  12/03/19 0730  *  Not included in total MAR Hold    HYDROmorphone (DILAUDID) injection 0.5-1 mg (mg) Total dose:  Cannot be calculated* Dosing weight:  78 *Administration dose not documented Date/Time  Rate/Dose/Volume Action  12/03/19  0730  *Not included in total MAR Hold    hydroxypropyl methylcellulose / hypromellose (ISOPTO TEARS / GONIOVISC) 2.5 % ophthalmic solution 1 drop (drop) Total dose:  Cannot be calculated* Dosing weight:  74.2 *Administration dose not documented Date/Time  Rate/Dose/Volume Action  12/03/19 0730  *Not included in total MAR Hold    insulin aspart (novoLOG) injection 0-5 Units (Units) Total dose:  Cannot be calculated* Dosing weight:  78 *Administration dose not documented Date/Time  Rate/Dose/Volume Action  12/03/19 0730  *Not included in total MAR Hold    insulin aspart (novoLOG) injection 0-6 Units (Units) Total dose:  Cannot be calculated* Dosing weight:  75.5 *Administration dose not documented Date/Time  Rate/Dose/Volume Action  12/03/19 0730  *Not included in total MAR Hold    isosorbide mononitrate (IMDUR) 24 hr tablet 30 mg (mg) Total dose:  Cannot be calculated* *Administration dose not documented Date/Time  Rate/Dose/Volume Action  12/03/19 0730  *Not included in total MAR Hold    latanoprost (XALATAN) 0.005 % ophthalmic solution 1 drop (drop) Total dose:  Cannot be calculated* Dosing weight:  75.5 *Administration dose not documented Date/Time  Rate/Dose/Volume Action  12/03/19 0730  *Not included in total MAR Hold    multivitamin (RENA-VIT) tablet 1 tablet (tablet) Total dose:  Cannot be calculated* Dosing weight:  73.5 *Administration dose not documented Date/Time  Rate/Dose/Volume Action  12/03/19 0730  *Not included in total MAR Hold    pantoprazole (PROTONIX) EC tablet 40 mg (mg) Total dose:  Cannot be calculated* Dosing weight:  78 *Administration dose not documented Date/Time  Rate/Dose/Volume Action  12/03/19 0730  *Not included in total MAR Hold    pilocarpine (PILOCAR) 4 % ophthalmic solution 1 drop (drop) Total dose:  Cannot be calculated* Dosing weight:  75.5 *Administration dose not documented Date/Time  Rate/Dose/Volume Action  12/03/19 0730  *Not  included in total MAR Hold    sodium chloride flush (NS) 0.9 % injection 3 mL (mL) Total dose:  Cannot be calculated* Dosing weight:  78.8 *Administration dose not documented Date/Time  Rate/Dose/Volume Action  12/03/19 0730  *Not included in total MAR Hold    tamsulosin (FLOMAX) capsule 0.4 mg (mg) Total dose:  Cannot be calculated* Dosing weight:  73.5 *Administration dose not documented Date/Time  Rate/Dose/Volume Action  12/03/19 0730  *Not included in total MAR Hold    Contrast  Medication Name Total Dose  iohexol (OMNIPAQUE) 350 MG/ML injection 50 mL    Radiation/Fluoro  Fluoro time: 7.4 (min) DAP: 18734 (mGycm2) Cumulative Air Kerma: 482.5 (mGy)  Complications  Complications documented before study signed (12/03/2019 8:33 AM)   RIGHT/LEFT HEART CATH AND CORONARY ANGIOGRAPHY  None Documented by Burnell Blanks, MD 12/03/2019 8:25 AM  Date Found: 12/03/2019  Time Range: Intraprocedure      Coronary Findings  Diagnostic Dominance: Right Left Anterior Descending  Vessel is large.  Prox LAD lesion is 30% stenosed.  Left Circumflex  Vessel is moderate in size.  Mid Cx lesion is 20% stenosed.  Right Coronary Artery  Vessel is large.  Prox RCA to Mid RCA lesion is 20% stenosed.  Dist RCA lesion is 20% stenosed.  Right Posterior Atrioventricular Artery  RPAV lesion is 40% stenosed.  Intervention  No interventions have been documented. Coronary Diagrams  Diagnostic Dominance: Right  Intervention  Implants   Vascular Products  Closure Mynx Control [id number]- Implanted Inventory item: CLOSURE [facility]CONTROL 15F Model/Cat number: [id number] Manufacturer: CLelandLot number: [id number] Device identifier: [id number] identifier type: GS1  Area Of Implantation: Femoral Artery    GUDID Information  Request status Successful    Brand name: MYNX CONTROL Version/Model: [id number] Company name: ARock Hallsafety info  as of 12/03/19: MR Safe  Contains dry or latex rubber: No    GMDN P.T. name: Wound hydrogel dressing, non-antimicrobial    As of 12/03/2019  Status: Implanted      Syngo Images  Show images for CARDIAC CATHETERIZATION Images on Long Term Storage  Show images for ODonal, Lynamto Procedure Log  Procedure Log    Hemo Data   Most Recent Value  Fick Cardiac Output 6.95 L/min  Fick Cardiac Output Index 4.01 (L/min)/BSA  Aortic Mean Gradient 26.81 mmHg  Aortic Peak Gradient 8 mmHg  Aortic Valve Area 1.00  Aortic Value Area Index 0.58 cm2/BSA  RA A Wave 3 mmHg  RA V Wave 3 mmHg  RA Mean 1 mmHg  RV Systolic Pressure 30 mmHg  RV Diastolic Pressure -1 mmHg  RV EDP 1 mmHg  PA Systolic Pressure 23 mmHg  PA Diastolic Pressure 7 mmHg  PA Mean 12 mmHg  PW A Wave 8 mmHg  PW V Wave 6 mmHg  PW Mean 5 mmHg  AO Systolic Pressure 1696mmHg  AO Diastolic Pressure 61 mmHg  AO Mean 94 mmHg  LV Systolic Pressure 1789mmHg  LV Diastolic Pressure 5 mmHg  LV EDP 12 mmHg  AOp Systolic Pressure 1381mmHg  AOp Diastolic Pressure 59 mmHg  AOp Mean Pressure 94 mmHg  LVp Systolic Pressure 1017mmHg  LVp Diastolic Pressure 4 mmHg  LVp EDP Pressure 12 mmHg  QP/QS 1  TPVR Index 2.99 HRUI  TSVR Index 23.41 HRUI  PVR SVR Ratio 0.08  TPVR/TSVR Ratio 0.13    ADDENDUM REPORT: 12/02/2019 13:26  EXAM: OVER-READ INTERPRETATION  CT CHEST  The following report is an over-read performed by radiologist Dr. JSamara SnideGIron [facility] HospitalRadiology, PA on 12/02/2019. This over-read does not include interpretation of cardiac or coronary anatomy or pathology. The coronary CTA interpretation by the cardiologist is attached.  COMPARISON:  11/26/2019 chest CT angiogram.  FINDINGS: Please see the separate concurrent chest CT angiogram report for details.  IMPRESSION: Please see the separate concurrent chest CT angiogram report for details.   Electronically Signed   By: JIlona SorrelM.D.    On: 12/02/2019 13:26   Addended by PSharyn Blitz MD on 12/02/2019 1:28 PM  Study Result  Addenda  ADDENDUM REPORT: 12/02/2019 13:26  EXAM: OVER-READ INTERPRETATION  CT CHEST  The following report is an over-read performed by radiologist Dr. JSamara SnideGMargaret R. Pard[facility] HospitalRadiology, PTurinon 12/02/2019. This over-read does not include interpretation of cardiac or coronary anatomy or pathology. The coronary CTA interpretation by the cardiologist is attached.  COMPARISON:  11/26/2019 chest CT angiogram.  FINDINGS: Please see the separate concurrent chest CT angiogram report for details.  IMPRESSION: Please see the separate concurrent chest CT angiogram report for details.   Electronically Signed   By: JIlona SorrelM.D.   On: 12/02/2019 13:26   Signed by PSharyn Blitz MD on 12/02/2019 1:28 PM  Narrative & Impression  CLINICAL DATA:  Severe Aortic Stenosis.  EXAM: Cardiac TAVR CT  TECHNIQUE: The  patient was scanned on a [employer]. A 120 kV retrospective scan was triggered in the descending thoracic aorta at 111 HU's. Gantry rotation speed was 250 msecs and collimation was .6 mm. No beta blockade or nitro were given. The 3D data set was reconstructed in 5% intervals of the R-R cycle. Systolic and diastolic phases were analyzed on a dedicated work station using MPR, MIP and VRT modes. The patient received 80 cc of contrast.  FINDINGS: Image quality: Excellent.  Noise artifact is: Limited.  Valve Morphology: The aortic valve is tricuspid. The leaflets are severely calcified and severely thickened. Leaflet calcification appears diffuse. The leaflets exhibit severely restricted movement in systole  Aortic Valve Calcium score: 2336  Aortic annular dimension:  Phase assessed: 20%  Annular area: 462 mm2  Annular perimeter: 77.3 mm  Max diameter: 26.1 mm  Min diameter: 22.9 mm  Annular and subannular calcification:  None.  Optimal coplanar projection: LAO 1 CRA 3  Coronary Artery Height above Annulus:  Left Main: 15.9 mm  Right Coronary: 16.3 mm  Sinus of Valsalva Measurements:  Non-coronary: 34 mm  Right-coronary: 33 mm  Left-coronary: 34 mm  Sinus of Valsalva Height:  Non-coronary: 26.0 mm  Right-coronary: 23.2 mm  Left-coronary: 22.5 mm  Sinotubular Junction: 27 mm with mild to moderate calcifications.  Ascending Thoracic Aorta: 31 mm with diffuse mixed density atherosclerosis noted.  Coronary Arteries: CAC score of 802, which is 80th percentile for age- and sex-matched controls. Normal coronary origin. Right dominance. The study was performed without use of NTG and is insufficient for plaque evaluation. The following assessment was made nonetheless:  Left main: The left main is a large caliber vessel with a normal take off from the left coronary cusp that trifurcates into a LAD, LCX, and ramus intermedius. There is minimal mixed density plaque (<25%).  Left anterior descending artery: The proximal LAD contains mild mixed density plaque (25-49%). The mid LAD contains minimal calcified plaque (<25%). The distal LAD contains minimal non-calcified plaque (<25%).  Ramus intermedius: Large vessel with minimal calcified plaque (<25%).  Left circumflex artery: The LCX is non-dominant with minimal non-calcified plaque (<25%).  Right coronary artery: Dominant with normal take off from the [place]. The proximal and mid RCA segments contain minimal calcified plaque (<25%). The distal RCA contains minimal non-calcified plaque. The RCA terminates as a patent PDA and PLV branch.  Cardiac Morphology:  Right Atrium: Right atrial size is within normal limits.  Right Ventricle: The right ventricular cavity is within normal limits.  Left Atrium: Left atrial size is normal in size with no left atrial appendage filling defect.  Left Ventricle: The  ventricular cavity size is within normal limits. There are no stigmata of prior infarction. There is no abnormal filling defect. Normal left ventricular function, LVEF=72%. No regional wall motion abnormalities.  Pulmonary arteries: Normal in size without proximal filling defect.  Pulmonary veins: Normal pulmonary venous drainage.  Pericardium: Normal thickness with no significant effusion or calcium present.  Mitral Valve: The mitral valve is normal structure with mild mitral annular calcification.  Extra-cardiac findings: See attached radiology report for non-cardiac structures.  IMPRESSION: 1. 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  2. Annular measurements appropriate for 26 mm Edwards Sapien 3 TAVR  3. No significant annular or subannular calcifications.  4. Sufficient coronary to annulus distance.  5. Optimal Fluoroscopic Angle for Delivery: LAO 1 CRA 3  6. Coronary calcium score of 802 which is 80th percentile  for age- and sex-matched controls.  7. 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  [place] T. Audie Box, MD  Electronically Signed: By: Eleonore Chiquito On: 12/01/2019 22:06    CLINICAL DATA:  Inpatient. Severe aortic stenosis. Pre-TAVR planning.  EXAM: CT ANGIOGRAPHY CHEST, ABDOMEN AND PELVIS  TECHNIQUE: Multidetector CT imaging through the chest, abdomen and pelvis was performed using the standard protocol during bolus administration of intravenous contrast. Multiplanar reconstructed images and MIPs were obtained and reviewed to evaluate the vascular anatomy.  CONTRAST:  12m OMNIPAQUE IOHEXOL 350 MG/ML SOLN  COMPARISON:  11/26/2019 CT angiogram of the chest, abdomen and pelvis.  FINDINGS: CTA CHEST FINDINGS  Cardiovascular: Normal heart size. No significant  pericardial effusion/thickening. Diffuse thickening and coarse calcification of the aortic valve. Three-vessel coronary atherosclerosis. Atherosclerotic nonaneurysmal thoracic aorta. Normal caliber pulmonary arteries. No central pulmonary emboli.  Mediastinum/Nodes: Scattered subcentimeter hypodense bilateral thyroid nodules. Not clinically significant; no follow-up imaging recommended (ref: J Am Coll Radiol. 2015 Feb;12(2): 143-50). Unremarkable esophagus. No pathologically enlarged axillary, mediastinal or hilar lymph nodes.  Lungs/Pleura: No pneumothorax. No pleural effusion. No acute consolidative airspace disease or lung masses. Anterior left upper lobe tiny 3 mm solid pulmonary nodule (series 5/image 46), stable since recent chest CT. No additional significant pulmonary nodules.  Musculoskeletal: No aggressive appearing focal osseous lesions. Moderate thoracic spondylosis. Symmetric moderate bilateral gynecomastia, stable.  CTA ABDOMEN AND PELVIS FINDINGS  Hepatobiliary: Normal liver with no liver mass. Stable distended gallbladder with layering subcentimeter calcified gallstones. No gallbladder wall thickening or pericholecystic fluid. No biliary ductal dilatation.  Pancreas: Normal, with no mass or duct dilation.  Spleen: Normal size. No mass.  Adrenals/Urinary Tract: Left adrenal 2.6 cm nodule with density 80 HU and right adrenal 1.7 cm nodule with density 57 HU, both characterized as benign adenomas on recent noncontrast chest CT. No overt hydronephrosis. No contour deforming renal masses. Normal nondistended bladder.  Stomach/Bowel: Normal non-distended stomach. Normal caliber small bowel with no small bowel wall thickening. Normal appendix. Normal large bowel with no diverticulosis, large bowel wall thickening or pericolonic fat stranding.  Vascular/Lymphatic: Atherosclerotic nonaneurysmal abdominal aorta. No pathologically enlarged lymph nodes in the  abdomen or pelvis.  Reproductive: Mild prostatomegaly. Brachytherapy seeds scattered throughout the prostate.  Other: No pneumoperitoneum, ascites or focal fluid collection. Small fat containing left inguinal hernia.  Musculoskeletal: No aggressive appearing focal osseous lesions. Mild lumbar spondylosis.  VASCULAR MEASUREMENTS PERTINENT TO TAVR:  AORTA:  Minimal Aortic Diameter-14.8 x 13.9 mm  Severity of Aortic Calcification-moderate to severe  RIGHT PELVIS:  Right Common Iliac Artery -  Minimal Diameter-10.2 x 6.0 mm  Tortuosity-mild  Calcification-severe  Right External Iliac Artery -  Minimal Diameter-5.5 x 4.6 mm  Tortuosity-mild-to-moderate  Calcification-severe  Right Common Femoral Artery -  Minimal Diameter-7.3 x 6.7 mm  Tortuosity-mild  Calcification-moderate  LEFT PELVIS:  Left Common Iliac Artery -  Minimal Diameter-9.3 x 8.2 mm  Tortuosity-mild  Calcification-severe  Left External Iliac Artery -  Minimal Diameter-7.1 x 5.1 mm  Tortuosity-mild mild  Calcification-severe  Left Common Femoral Artery -  Minimal Diameter-8.3 x 7.0 mm  Tortuosity-mild  Calcification-moderate  Review of the MIP images confirms the above findings.  IMPRESSION: 1. Vascular findings and measurements pertinent to potential TAVR procedure, as detailed. 2. Diffuse thickening and coarse calcification of the aortic valve, compatible with the reported history of severe aortic stenosis. 3. Three-vessel coronary atherosclerosis. 4.  Tiny 3 mm solid left upper lobe pulmonary nodule. No follow-up needed if patient is low-risk. Non-contrast chest CT can be considered in 12 months if patient is high-risk. This recommendation follows the consensus statement: Guidelines for Management of Incidental Pulmonary Nodules Detected on CT Images: From the Fleischner Society 2017; Radiology 2017; 284:228-243. 5. Cholelithiasis. 6.  Bilateral adrenal adenomas. 7. Mild prostatomegaly. 8. Small fat containing left inguinal hernia. 9. Aortic Atherosclerosis (ICD10-I70.0).   Electronically Signed   By: Ilona Sorrel M.D.   On: 12/02/2019 13:55   STS Risk Calculator:  Procedure: Isolated AVR Risk of Mortality: 4.160% Renal Failure: NA Permanent Stroke: 2.342% Prolonged Ventilation: 11.722% DSW Infection: 0.185% Reoperation: 3.991% Morbidity or Mortality: 20.422% Short Length of Stay: 15.658% Long Length of Stay: 9.915%  Impression:  This 79 year old gentleman has stage D, severe, symptomatic aortic stenosis with New York Heart Association class II symptoms of exertional fatigue and shortness of breath consistent with chronic diastolic congestive heart failure.  I have personally reviewed his 2D echocardiogram, cardiac catheterization, and CTA studies.  His echocardiogram shows a calcified aortic valve with a mean transvalvular gradient of 26 mmHg and a peak gradient of 55 mmHg suggesting moderate aortic stenosis.  His dimensionless index was 0.31 with a calculated aortic valve area of 0.89 cm.  His exam and symptoms seem more consistent with severe aortic stenosis.  His gated cardiac CTA shows a severely calcified and thickened aortic valve with markedly restricted leaflet mobility consistent with severe aortic stenosis.  I agree that aortic valve replacement is indicated in this patient for relief of his symptoms and to prevent progressive left ventricular deterioration and difficulty with hemodialysis.  At 79 years old with end-stage renal disease on hemodialysis and multiple other comorbid risk factors I do not think he is a candidate for open surgical aortic valve placement.  I think transcatheter aortic valve replacement would be the best option for him.  His gated cardiac CTA shows anatomy suitable for transcatheter aortic valve replacement using a SAPIEN 3 valve.  His abdominal and pelvic CTA shows  significant aortoiliac atherosclerotic disease but there appears to be adequate vessels to allow left transfemoral access.  The patient and his daughter were counseled at length regarding treatment alternatives for management of severe symptomatic aortic stenosis. The risks and benefits of surgical intervention has been discussed in detail. Long-term prognosis with medical therapy was discussed. Alternative approaches such as conventional surgical aortic valve replacement, transcatheter aortic valve replacement, and palliative medical therapy were compared and contrasted at length. This discussion was placed in the context of the patient's own specific clinical presentation and past medical history. All of their questions have been addressed.   Following the decision to proceed with transcatheter aortic valve replacement, a discussion was held regarding what types of management strategies would be attempted intraoperatively in the event of life-threatening complications, including whether or not the patient would be considered a candidate for the use of cardiopulmonary bypass and/or conversion to open sternotomy for attempted surgical intervention.  I do not think he is a candidate for emergent sternotomy to manage any intraoperative complications given his advanced age and end-stage renal disease on hemodialysis.  The patient is aware of the fact that transient use of cardiopulmonary bypass may be necessary. The patient has been advised of a variety of complications that might develop including but not limited to risks of death, stroke, paravalvular leak, aortic dissection or other major vascular complications, aortic annulus rupture, device embolization, cardiac rupture or  perforation, mitral regurgitation, acute myocardial infarction, arrhythmia, heart block or bradycardia requiring permanent pacemaker placement, congestive heart failure, respiratory failure, renal failure, pneumonia, infection, other late  complications related to structural valve deterioration or migration, or other complications that might ultimately cause a temporary or permanent loss of functional independence or other long term morbidity. The patient provides full informed consent for the procedure as described and all questions were answered.     Plan:  He is scheduled for dental extractions on Wednesday 12/30/2019.  He would then be scheduled for transfemoral TAVR a few weeks later.   I spent 60 minutes performing this consultation and > 50% of this time was spent face to face counseling and coordinating the care of this patient's severe symptomatic aortic stenosis.    Gaye Pollack, MD 12/23/2019 1:28 PM

## 2019-12-24 ENCOUNTER — Telehealth: Payer: Self-pay

## 2019-12-24 ENCOUNTER — Telehealth (HOSPITAL_COMMUNITY): Payer: Self-pay | Admitting: Dentistry

## 2019-12-24 DIAGNOSIS — Z992 Dependence on renal dialysis: Secondary | ICD-10-CM | POA: Diagnosis not present

## 2019-12-24 DIAGNOSIS — N186 End stage renal disease: Secondary | ICD-10-CM | POA: Diagnosis not present

## 2019-12-24 DIAGNOSIS — D631 Anemia in chronic kidney disease: Secondary | ICD-10-CM | POA: Diagnosis not present

## 2019-12-24 DIAGNOSIS — Z23 Encounter for immunization: Secondary | ICD-10-CM | POA: Diagnosis not present

## 2019-12-24 DIAGNOSIS — N2581 Secondary hyperparathyroidism of renal origin: Secondary | ICD-10-CM | POA: Diagnosis not present

## 2019-12-24 DIAGNOSIS — E118 Type 2 diabetes mellitus with unspecified complications: Secondary | ICD-10-CM | POA: Diagnosis not present

## 2019-12-24 NOTE — Chronic Care Management (AMB) (Signed)
Chronic Care Management    Social Work Follow Up Note  12/23/2019 Name: TIPTON BALLOW MRN: [id number] DOB: [birth year]  ROYE GUSTAFSON is a 79 y.o. year old male who is a primary care patient of [name], MD. The CCM team was consulted for assistance with care coordination.   Review of patient status, including review of consultants reports, other relevant assessments, and collaboration with appropriate care team members and the patient's provider was performed as part of comprehensive patient evaluation and provision of chronic care management services.    SDOH (Social Determinants of Health) assessments performed: No    Outpatient Encounter Medications as of 12/23/2019  Medication Sig Note  . acetaminophen (TYLENOL) 500 MG tablet Take 2 tablets (1,000 mg total) by mouth every 6 (six) hours as needed for mild pain, moderate pain or headache.   [name] apraclonidine (IOPIDINE) 0.5 % ophthalmic solution Place 1 drop into the right eye 3 (three) times daily.  12/13/2019: 2 doses today  . atorvastatin (LIPITOR) 10 MG tablet TAKE 1 TABLET BY MOUTH EVERY DAY (Patient taking differently: Take 10 mg by mouth daily. )   . B Complex-C-Folic Acid (DIALYVITE 785) 0.8 MG TABS Take 1 tablet by mouth daily.   . BD PEN NEEDLE NANO U/F 32G X 4 MM MISC USE SUBCUTANEOUS FOUR TIMES A DAY   . blood glucose meter kit and supplies KIT Dispense based on patient and insurance preference. Use up to four times daily as directed. (FOR ICD-9 250.00, 250.01). For QAC - HS accuchecks.   . carvedilol (COREG) 3.125 MG tablet TAKE 1 TABLET(3.125 MG) BY MOUTH TWICE DAILY WITH A MEAL (Patient taking differently: Take 3.125 mg by mouth 2 (two) times daily with a meal. )   . diclofenac Sodium (VOLTAREN) 1 % GEL Apply 4 g topically 4 (four) times daily as needed. (Patient taking differently: Apply 4 g topically 4 (four) times daily as needed (pain). )   . dorzolamide-timolol (COSOPT) 22.3-6.8 MG/ML ophthalmic solution Place 1  drop into the right eye 2 (two) times daily.   [name] esomeprazole (NEXIUM) 40 MG capsule TAKE 1 CAPSULE BY MOUTH EVERY DAY (Patient taking differently: Take 40 mg by mouth every other day. )   . ferric citrate (AURYXIA) 1 GM 210 MG(Fe) tablet Take 420 mg by mouth 3 (three) times daily with meals.   . gabapentin (NEURONTIN) 100 MG capsule TAKE 1 CAPSULE(100 MG) BY MOUTH AT BEDTIME (Patient taking differently: Take 100 mg by mouth at bedtime. )   . hydrALAZINE (APRESOLINE) 25 MG tablet TAKE 1 TABLET(25 MG) BY MOUTH TWICE DAILY (Patient taking differently: Take 25 mg by mouth in the morning and at bedtime. )   . hydroxypropyl methylcellulose / hypromellose (ISOPTO TEARS / GONIOVISC) 2.5 % ophthalmic solution Place 1 drop into the left eye as needed for dry eyes.   . isosorbide mononitrate (IMDUR) 30 MG 24 hr tablet TAKE 1 TABLET(30 MG) BY MOUTH DAILY (Patient taking differently: Take 30 mg by mouth daily. )   . Lancets (ONETOUCH DELICA PLUS [id number]) MISC USE AS DIRECTED UP TO FOUR TIMES A DAY   . latanoprost (XALATAN) 0.005 % ophthalmic solution Place 1 drop into the right eye at bedtime.   [name] test strip USE AS DIRECTED TO TEST FOUR TIMES A DAY   . pilocarpine (PILOCAR) 4 % ophthalmic solution Place 1 drop into the right eye 4 (four) times daily.    . tamsulosin (FLOMAX) 0.4 MG CAPS capsule Take 1 capsule (  0.4 mg total) by mouth at bedtime. (Patient not taking: Reported on 12/23/2019)    No facility-administered encounter medications on file as of 12/23/2019.     Goals Addressed            This Visit's Progress   . Collaborate with RN Care Manager to assist with care coordination needs post inpatient stay       CARE PLAN ENTRY (see longitudinal plan of care for additional care plan information)  Current Barriers:  . Level of care concerns . Social Isolation . Recent inpatient stay from 9/23- 9/30 . 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 Inbound call received from patients daughter [name] . Determined the patient has refused home health services at this time due to worry over increasing COVID exposure in the home . Discussed [name] was contacted by [place] spoke with Remote RN who indicated patient was already active with service prior to new referral placed recently by Dr. Baird Cancer o Rosamaria Lints SW would inform Dr. Baird Cancer the patient is not interested in working with [facility] due to concern of increasing COVID exposure in the home o Informed by [name] she would like to keep the service at this time in hopes her father will change his mind  . Determined the patient has exhibited increased agitation toward [name] and her cousin who is assisting with patient care approximately 3 days/week o Discussed patients decreased independence and worsening eyesight may be contributing to increase in agitation o Encouraged [name] to contact SW as needed for resource needs o Praised [name] for care she is providing the patient at this time to allow him to remain safely in his home . Discussed plans for upcoming procedures o Patient will have teeth extractions performed over the next week o Planned valve replacement for November 9th . Scheduled follow up call to [facility] over the next month to assist with ongoing care coordination needs  Completed 12/15/19 with [name] . Successful outbound call placed to the patients daughter in response to a voice message requesting SW assistance . Discussed patient discharged from the hospital this morning in order to attend HD treatment o The patient was at [facility] from 10/10 - 10/12 due to near syncope and ESRD . Determined the patient has not yet received home health  services and declined a referral during discharge due to a past referral being placed . Performed chart review to note a referral was placed by the patients primary provider on 10/5 o Discussed barriers to start of care including staff challenges due to [place] 19 pandemic as well as recent inpatient stay o Advised [name] the referral coordinator is actively working this referral . Discussed [name]'s concern surrounding increased exposure risk in the home to [place] 19 by allowing home health in the home o Rosamaria Lints all home health agencies are following COVID protocols and using appropriate PPE o Determined [name] plans to review COVID protocol telephonically prior to consenting to services . Collaboration with RN Care Manager to inform of patient disposition and plan . Collaboration with Dr. Baird Cancer to review [name] concern with allowing home health services in the home as well as to discuss if the patient may benefit from [place] o Determined  Dr Baird Cancer agrees with [facility] referral  Completed 12/07/19 with the patient and his daughter [name] . Inter-disciplinary care team collaboration (see longitudinal plan of care) . Successful outbound call placed to the patients daughter and caregiver [name] to assess for post-discharge needs . Determined the patient did receive a call over the weekend from [facility] to complete a TOC call o During this call the patient did agree to home health services o [name] reports she is unclear if home health had been ordered or to which agency . Performed chart review to note PA documented she would inform inpatient SW and PCP of patient decision to participate in home health . Collaboration with Dr. Baird Cancer to determine if she would be the ordering provider . Joint call completed with [name] and the patient to assess for care coordination needs o Discussed the patient is currently receiving MQB Medicaid and is not interested in placement at this  time o [name]'s cousin has agreed to assist with patient care needs by dropping food off multiple times a week and checking on the patient while [name] is at work . Collaboration with RN Care Manager to inform of interventions and plan . Scheduled follow up call over the next two weeks  Patient Self Care Activities: With the help of his daughter . Self administers medications as prescribed . Attends all scheduled provider appointments . Performs ADL's independently . Calls provider office for new concerns or questions  Please see past updates related to this goal by clicking on the "Past Updates" button in the selected goal          Follow Up Plan: SW will follow up with patient by phone over the next month.   [name], BSW, CDP Social Worker, Certified Dementia Practitioner [place] [facility] [place] [facility] [phone]  Total time spent performing care coordination and/or care management activities with the patient by phone or face to face = 15 minutes.

## 2019-12-24 NOTE — Telephone Encounter (Signed)
Called to do TCM call and schedule hospital f/u . Spoke with pt daughter she stated pt has a lot going on at the moment and so does she. She stated she will call to schedule an appt after pts mouth surgery.

## 2019-12-24 NOTE — Patient Instructions (Signed)
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 which put patient at increased risk for hospitalization  Social Work Clinical Goal(s):   Over the next 90 days the patient and his daughter will work with SW to address care coordination needs  CCM SW Interventions: Completed 12/23/19 with [name]  Inbound call received from patients daughter [name]  Determined the patient has refused home health services at this time due to worry over increasing COVID exposure in the home  Discussed [name] was contacted by [place] spoke with Remote RN who indicated patient was already active with service prior to new referral placed recently by Dr. Baird Cancer o Rosamaria Lints SW would inform Dr. Baird Cancer the patient is not interested in working with [facility] due to concern of increasing COVID exposure in the home o Informed by [name] she would like to keep the service at this time in hopes her father will change his mind   Determined the patient has exhibited increased agitation toward [name] and her cousin who is assisting with patient care approximately 3 days/week o Discussed patients decreased independence and worsening eyesight may be contributing to increase in agitation o Encouraged [name] to contact SW as needed for resource needs o Praised Engineer, structural for care she is providing the patient at this time to allow him to remain safely in his home  Discussed plans for upcoming procedures o Patient will have teeth extractions performed over the next week o Planned valve replacement for  November 9th  Scheduled follow up call to [facility] over the next month to assist with ongoing care coordination needs  Completed 12/15/19 with [name]  Successful outbound call placed to the patients daughter in response to a voice message requesting SW assistance  Discussed patient discharged from the hospital this morning in order to attend HD treatment o The patient was at [facility] from 10/10 - 10/12 due to near syncope and ESRD  Determined the patient has not yet received home health services and declined a referral during discharge due to a past referral being placed  Performed chart review to note a referral was placed by the patients primary provider on 10/5 o Discussed barriers to start of care including staff challenges due to [place] 19 pandemic as well as recent inpatient stay o Rosamaria Lints the referral coordinator is actively working this referral  Discussed [name]'s concern surrounding increased exposure risk in the home to [place] 19 by allowing home health in the home o Rosamaria Lints all home health agencies are following COVID protocols and using appropriate PPE o Determined [name] plans to review COVID protocol telephonically prior to consenting to services  Collaboration with RN Care Manager to inform of patient disposition and plan  Collaboration with Dr. Baird Cancer to review [name] concern with allowing home health services in the home as well as to discuss if the patient may benefit from [facility] o Determined Dr Baird Cancer agrees with [facility] referral  Completed 12/07/19 with the patient and his daughter [place] Payment care  team collaboration (see longitudinal plan of care)  Successful outbound call placed to the patients daughter and caregiver [name] to assess for post-discharge needs  Determined the patient did receive a call over the weekend from [facility] to complete a TOC call o During this call the patient did agree to home health  services o [name] reports she is unclear if home health had been ordered or to which agency  Performed chart review to note PA documented she would inform inpatient SW and PCP of patient decision to participate in home health  Collaboration with Dr. Baird Cancer to determine if she would be the ordering provider  Joint call completed with [name] and the patient to assess for care coordination needs o Discussed the patient is currently receiving MQB Medicaid and is not interested in placement at this time o [name]'s cousin has agreed to assist with patient care needs by dropping food off multiple times a week and checking on the patient while [name] is at work  Ambulance person with Consulting civil engineer to inform of interventions and plan  Scheduled follow up call over the next two weeks  Patient Self Care Activities: With the help of his daughter  Self administers medications as prescribed  Attends all scheduled provider appointments  Performs ADL's independently  Calls provider office for new concerns or questions  Please see past updates related to this goal by clicking on the "Past Updates" button in the selected goal          Follow Up Plan: SW will follow up with patient by phone over the next month.   [name], BSW, CDP Social Worker, Certified Dementia Practitioner [place] [facility] [place] [facility] [phone]

## 2019-12-26 ENCOUNTER — Other Ambulatory Visit (HOSPITAL_COMMUNITY)
Admission: RE | Admit: 2019-12-26 | Discharge: 2019-12-26 | Disposition: A | Discharge status: Home / Self Care | Payer: Medicare Other | Source: Ambulatory Visit | Attending: Dentistry | Admitting: Dentistry

## 2019-12-26 DIAGNOSIS — E118 Type 2 diabetes mellitus with unspecified complications: Secondary | ICD-10-CM | POA: Diagnosis not present

## 2019-12-26 DIAGNOSIS — Z20822 Contact with and (suspected) exposure to covid-19: Secondary | ICD-10-CM | POA: Diagnosis not present

## 2019-12-26 DIAGNOSIS — Z992 Dependence on renal dialysis: Secondary | ICD-10-CM | POA: Diagnosis not present

## 2019-12-26 DIAGNOSIS — D631 Anemia in chronic kidney disease: Secondary | ICD-10-CM | POA: Diagnosis not present

## 2019-12-26 DIAGNOSIS — Z01812 Encounter for preprocedural laboratory examination: Secondary | ICD-10-CM | POA: Diagnosis not present

## 2019-12-26 DIAGNOSIS — N186 End stage renal disease: Secondary | ICD-10-CM | POA: Diagnosis not present

## 2019-12-26 DIAGNOSIS — N2581 Secondary hyperparathyroidism of renal origin: Secondary | ICD-10-CM | POA: Diagnosis not present

## 2019-12-26 DIAGNOSIS — Z23 Encounter for immunization: Secondary | ICD-10-CM | POA: Diagnosis not present

## 2019-12-26 LAB — SARS CORONAVIRUS 2 (TAT 6-24 HRS): SARS Coronavirus 2: NEGATIVE

## 2019-12-28 ENCOUNTER — Encounter (HOSPITAL_COMMUNITY): Payer: Self-pay | Admitting: Dentistry

## 2019-12-28 ENCOUNTER — Other Ambulatory Visit: Payer: Self-pay

## 2019-12-28 NOTE — Progress Notes (Signed)
Mr. Ludlum asked me to call his daughter [name].  I called  and spoke with [name]. 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 [name] asked if the test that will have to be done before Valve surgery can be done tomorrow. I informed her that it has to be within 2 weeks of surgery, [name] said it would be exactly 2 weeks.  I checked Mr. Melchior is not scheduled for Heart surgery at this time. [name] said she will ask tomorrow, she will call his heart surgeon's office to see. [name] said,  "it is only me taking him to his appointments and dialysis and it can be a lot."

## 2019-12-29 DIAGNOSIS — D631 Anemia in chronic kidney disease: Secondary | ICD-10-CM | POA: Diagnosis not present

## 2019-12-29 DIAGNOSIS — Z992 Dependence on renal dialysis: Secondary | ICD-10-CM | POA: Diagnosis not present

## 2019-12-29 DIAGNOSIS — E118 Type 2 diabetes mellitus with unspecified complications: Secondary | ICD-10-CM | POA: Diagnosis not present

## 2019-12-29 DIAGNOSIS — N2581 Secondary hyperparathyroidism of renal origin: Secondary | ICD-10-CM | POA: Diagnosis not present

## 2019-12-29 DIAGNOSIS — Z23 Encounter for immunization: Secondary | ICD-10-CM | POA: Diagnosis not present

## 2019-12-29 DIAGNOSIS — N186 End stage renal disease: Secondary | ICD-10-CM | POA: Diagnosis not present

## 2019-12-29 NOTE — Anesthesia Preprocedure Evaluation (Addendum)
Anesthesia Evaluation  Patient identified by MRN, date of birth, ID band Patient awake    Reviewed: Allergy & Precautions, NPO status , Patient's Chart, lab work & pertinent test results  Airway Mallampati: II  TM Distance: >3 FB Neck ROM: Full    Dental  (+) Poor Dentition, Missing, Chipped, Dental Advisory Given   Pulmonary former smoker,    Pulmonary exam normal breath sounds clear to auscultation       Cardiovascular hypertension, Pt. on medications and Pt. on home beta blockers +CHF  + Valvular Problems/Murmurs (moderate to severe AS) AS  Rhythm:Regular Rate:Normal + Systolic murmurs Echo 3/49/17: 1. Left ventricular ejection fraction, by estimation, is 65 to 70%. The  left ventricle has normal function. The left ventricle has no regional  wall motion abnormalities. Left ventricular diastolic parameters are  indeterminate.  2. Right ventricular systolic function is normal. The right ventricular  size is normal. Tricuspid regurgitation signal is inadequate for assessing  PA pressure.  3. The mitral valve is degenerative. Trivial mitral valve regurgitation.  No evidence of mitral stenosis. Severe mitral annular calcification.  4. The aortic valve is calcified. There is severe calcifcation of the  aortic valve. There is severe thickening of the aortic valve. Aortic valve  regurgitation is trivial. 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  5. The inferior vena cava is normal in size with greater than 50%  respiratory variability, suggesting right atrial pressure of 3 mmHg.    Neuro/Psych negative neurological ROS  negative psych ROS   GI/Hepatic Neg liver ROS, GERD  Medicated and Controlled,  Endo/Other  diabetes, Well Controlled, Type 2No diabetes meds, last a1c 7.5  Renal/GU ESRF and DialysisRenal diseaseHD T/Th/Sat  negative genitourinary   Musculoskeletal  (+) Arthritis , Osteoarthritis,    Abdominal    Peds  Hematology  (+) Blood dyscrasia, anemia ,   Anesthesia Other Findings Dental caries  Reproductive/Obstetrics negative OB ROS                            Anesthesia Physical Anesthesia Plan  ASA: IV  Anesthesia Plan: General   Post-op Pain Management:    Induction: Intravenous  PONV Risk Score and Plan: 2 and Ondansetron, Dexamethasone and Treatment may vary due to age or medical condition  Airway Management Planned: Nasal ETT  Additional Equipment: Arterial line  Intra-op Plan:   Post-operative Plan: Extubation in OR  Informed Consent: I have reviewed the patients History and Physical, chart, labs and discussed the procedure including the risks, benefits and alternatives for the proposed anesthesia with the patient or authorized representative who has indicated his/her understanding and acceptance.     Dental advisory given  Plan Discussed with: CRNA  Anesthesia Plan Comments:       Anesthesia Quick Evaluation

## 2019-12-30 ENCOUNTER — Ambulatory Visit (HOSPITAL_COMMUNITY)
Admission: RE | Admit: 2019-12-30 | Discharge: 2019-12-30 | Disposition: A | Discharge status: Home / Self Care | Payer: Medicare Other | Source: Ambulatory Visit | Attending: Dentistry | Admitting: Dentistry

## 2019-12-30 ENCOUNTER — Ambulatory Visit (HOSPITAL_COMMUNITY): Payer: Medicare Other | Admitting: Anesthesiology

## 2019-12-30 ENCOUNTER — Encounter (HOSPITAL_COMMUNITY)
Admission: RE | Disposition: A | Discharge status: Home / Self Care | Payer: Self-pay | Source: Ambulatory Visit | Attending: Dentistry

## 2019-12-30 ENCOUNTER — Encounter (HOSPITAL_COMMUNITY): Payer: Self-pay | Admitting: Dentistry

## 2019-12-30 ENCOUNTER — Other Ambulatory Visit (HOSPITAL_COMMUNITY): Payer: Self-pay | Admitting: Dentistry

## 2019-12-30 ENCOUNTER — Other Ambulatory Visit: Payer: Self-pay

## 2019-12-30 DIAGNOSIS — K0889 Other specified disorders of teeth and supporting structures: Secondary | ICD-10-CM

## 2019-12-30 DIAGNOSIS — K029 Dental caries, unspecified: Secondary | ICD-10-CM | POA: Diagnosis not present

## 2019-12-30 DIAGNOSIS — E1122 Type 2 diabetes mellitus with diabetic chronic kidney disease: Secondary | ICD-10-CM | POA: Diagnosis not present

## 2019-12-30 DIAGNOSIS — I5032 Chronic diastolic (congestive) heart failure: Secondary | ICD-10-CM | POA: Insufficient documentation

## 2019-12-30 DIAGNOSIS — Z992 Dependence on renal dialysis: Secondary | ICD-10-CM | POA: Diagnosis not present

## 2019-12-30 DIAGNOSIS — N179 Acute kidney failure, unspecified: Secondary | ICD-10-CM | POA: Diagnosis not present

## 2019-12-30 DIAGNOSIS — Z87891 Personal history of nicotine dependence: Secondary | ICD-10-CM | POA: Insufficient documentation

## 2019-12-30 DIAGNOSIS — K083 Retained dental root: Secondary | ICD-10-CM | POA: Diagnosis not present

## 2019-12-30 DIAGNOSIS — I132 Hypertensive heart and chronic kidney disease with heart failure and with stage 5 chronic kidney disease, or end stage renal disease: Secondary | ICD-10-CM | POA: Diagnosis not present

## 2019-12-30 DIAGNOSIS — K0530 Chronic periodontitis, unspecified: Secondary | ICD-10-CM | POA: Diagnosis not present

## 2019-12-30 DIAGNOSIS — N186 End stage renal disease: Secondary | ICD-10-CM | POA: Insufficient documentation

## 2019-12-30 DIAGNOSIS — K045 Chronic apical periodontitis: Secondary | ICD-10-CM

## 2019-12-30 DIAGNOSIS — K08199 Complete loss of teeth due to other specified cause, unspecified class: Secondary | ICD-10-CM

## 2019-12-30 DIAGNOSIS — I350 Nonrheumatic aortic (valve) stenosis: Secondary | ICD-10-CM | POA: Diagnosis not present

## 2019-12-30 DIAGNOSIS — E785 Hyperlipidemia, unspecified: Secondary | ICD-10-CM | POA: Diagnosis not present

## 2019-12-30 HISTORY — PX: MULTIPLE EXTRACTIONS WITH ALVEOLOPLASTY: SHX5342

## 2019-12-30 HISTORY — DX: End stage renal disease: N18.6

## 2019-12-30 LAB — POCT I-STAT, CHEM 8
BUN: 37 mg/dL — ABNORMAL HIGH (ref 8–23)
Calcium, Ion: 1.17 mmol/L (ref 1.15–1.40)
Chloride: 101 mmol/L (ref 98–111)
Creatinine, Ser: 7.8 mg/dL — ABNORMAL HIGH (ref 0.61–1.24)
Glucose, Bld: 176 mg/dL — ABNORMAL HIGH (ref 70–99)
HCT: 40 % (ref 39.0–52.0)
Hemoglobin: 13.6 g/dL (ref 13.0–17.0)
Potassium: 4.3 mmol/L (ref 3.5–5.1)
Sodium: 142 mmol/L (ref 135–145)
TCO2: 30 mmol/L (ref 22–32)

## 2019-12-30 LAB — GLUCOSE, CAPILLARY
Glucose-Capillary: 149 mg/dL — ABNORMAL HIGH (ref 70–99)
Glucose-Capillary: 161 mg/dL — ABNORMAL HIGH (ref 70–99)

## 2019-12-30 SURGERY — MULTIPLE EXTRACTION WITH ALVEOLOPLASTY
Anesthesia: General | Site: Mouth

## 2019-12-30 MED ORDER — FENTANYL CITRATE (PF) 100 MCG/2ML IJ SOLN
INTRAMUSCULAR | Status: DC | PRN
  Administered 2019-12-30 (×2): 50 ug via INTRAVENOUS

## 2019-12-30 MED ORDER — HEMOSTATIC AGENTS (NO CHARGE) OPTIME
TOPICAL | Status: DC | PRN
  Administered 2019-12-30: 1

## 2019-12-30 MED ORDER — SODIUM CHLORIDE 0.9 % IV SOLN: INTRAVENOUS | Status: DC | PRN

## 2019-12-30 MED ORDER — CHLORHEXIDINE GLUCONATE 0.12 % MT SOLN
OROMUCOSAL | Status: AC
  Administered 2019-12-30: 15 mL via OROMUCOSAL
  Filled 2019-12-30: qty 15

## 2019-12-30 MED ORDER — SODIUM CHLORIDE 0.9 % IV SOLN
INTRAVENOUS | Status: DC | PRN
  Administered 2019-12-30: 25 ug via INTRAVENOUS
  Administered 2019-12-30: 10 ug via INTRAVENOUS

## 2019-12-30 MED ORDER — GLYCOPYRROLATE PF 0.2 MG/ML IJ SOSY
PREFILLED_SYRINGE | INTRAMUSCULAR | Status: AC
  Filled 2019-12-30: qty 1

## 2019-12-30 MED ORDER — FENTANYL CITRATE (PF) 100 MCG/2ML IJ SOLN: 25.0000 ug | INTRAMUSCULAR | Status: DC | PRN

## 2019-12-30 MED ORDER — ONDANSETRON HCL 4 MG/2ML IJ SOLN: 4.0000 mg | Freq: Once | INTRAMUSCULAR | Status: DC | PRN

## 2019-12-30 MED ORDER — ACETAMINOPHEN 500 MG PO TABS
1000.0000 mg | ORAL_TABLET | Freq: Once | ORAL | Status: AC
  Administered 2019-12-30: 1000 mg via ORAL
  Filled 2019-12-30: qty 2

## 2019-12-30 MED ORDER — OXYMETAZOLINE HCL 0.05 % NA SOLN
NASAL | Status: AC
  Filled 2019-12-30: qty 30

## 2019-12-30 MED ORDER — PHENYLEPHRINE HCL-NACL 10-0.9 MG/250ML-% IV SOLN
INTRAVENOUS | Status: DC | PRN
  Administered 2019-12-30: 25 ug/min via INTRAVENOUS

## 2019-12-30 MED ORDER — CHLORHEXIDINE GLUCONATE 0.12 % MT SOLN: 15.0000 mL | Freq: Once | OROMUCOSAL | Status: AC

## 2019-12-30 MED ORDER — GLYCOPYRROLATE PF 0.2 MG/ML IJ SOSY
PREFILLED_SYRINGE | INTRAMUSCULAR | Status: DC | PRN
  Administered 2019-12-30: .3 mg via INTRAVENOUS

## 2019-12-30 MED ORDER — BUPIVACAINE-EPINEPHRINE (PF) 0.5% -1:200000 IJ SOLN
INTRAMUSCULAR | Status: AC
  Filled 2019-12-30: qty 3.6

## 2019-12-30 MED ORDER — ROCURONIUM BROMIDE 100 MG/10ML IV SOLN
INTRAVENOUS | Status: DC | PRN
  Administered 2019-12-30: 60 mg via INTRAVENOUS

## 2019-12-30 MED ORDER — LIDOCAINE-EPINEPHRINE 2 %-1:100000 IJ SOLN
INTRAMUSCULAR | Status: AC
  Filled 2019-12-30: qty 10.2

## 2019-12-30 MED ORDER — PROPOFOL 10 MG/ML IV BOLUS
INTRAVENOUS | Status: AC
  Filled 2019-12-30: qty 40

## 2019-12-30 MED ORDER — FENTANYL CITRATE (PF) 250 MCG/5ML IJ SOLN
INTRAMUSCULAR | Status: AC
  Filled 2019-12-30: qty 5

## 2019-12-30 MED ORDER — CEFAZOLIN SODIUM-DEXTROSE 2-4 GM/100ML-% IV SOLN
2.0000 g | INTRAVENOUS | Status: DC
  Filled 2019-12-30: qty 100

## 2019-12-30 MED ORDER — ONDANSETRON HCL 4 MG/2ML IJ SOLN
INTRAMUSCULAR | Status: AC
  Filled 2019-12-30: qty 4

## 2019-12-30 MED ORDER — THROMBIN (RECOMBINANT) 5000 UNITS EX SOLR
CUTANEOUS | Status: AC
  Filled 2019-12-30: qty 5000

## 2019-12-30 MED ORDER — LIDOCAINE-EPINEPHRINE 2 %-1:100000 IJ SOLN
INTRAMUSCULAR | Status: DC | PRN
  Administered 2019-12-30 (×2): 1.7 mL via INTRADERMAL

## 2019-12-30 MED ORDER — CEFAZOLIN SODIUM-DEXTROSE 2-3 GM-%(50ML) IV SOLR
INTRAVENOUS | Status: DC | PRN
  Administered 2019-12-30: 2 g via INTRAVENOUS

## 2019-12-30 MED ORDER — LIDOCAINE 2% (20 MG/ML) 5 ML SYRINGE
INTRAMUSCULAR | Status: DC | PRN
  Administered 2019-12-30: 60 mg via INTRAVENOUS

## 2019-12-30 MED ORDER — PHENYLEPHRINE 40 MCG/ML (10ML) SYRINGE FOR IV PUSH (FOR BLOOD PRESSURE SUPPORT)
PREFILLED_SYRINGE | INTRAVENOUS | Status: AC
  Filled 2019-12-30: qty 10

## 2019-12-30 MED ORDER — PHENYLEPHRINE HCL (PRESSORS) 10 MG/ML IV SOLN
INTRAVENOUS | Status: DC | PRN
  Administered 2019-12-30: 80 ug via INTRAVENOUS
  Administered 2019-12-30: 120 ug via INTRAVENOUS

## 2019-12-30 MED ORDER — BUPIVACAINE-EPINEPHRINE 0.5% -1:200000 IJ SOLN
INTRAMUSCULAR | Status: DC | PRN
  Administered 2019-12-30: 1.8 mL

## 2019-12-30 MED ORDER — LIDOCAINE 2% (20 MG/ML) 5 ML SYRINGE
INTRAMUSCULAR | Status: AC
  Filled 2019-12-30: qty 10

## 2019-12-30 MED ORDER — ONDANSETRON HCL 4 MG/2ML IJ SOLN
INTRAMUSCULAR | Status: DC | PRN
  Administered 2019-12-30: 4 mg via INTRAVENOUS

## 2019-12-30 MED ORDER — OXYMETAZOLINE HCL 0.05 % NA SOLN
NASAL | Status: DC | PRN
  Administered 2019-12-30: 1

## 2019-12-30 MED ORDER — NEOSTIGMINE METHYLSULFATE 5 MG/5ML IV SOSY
PREFILLED_SYRINGE | INTRAVENOUS | Status: DC | PRN
  Administered 2019-12-30: 3 mg via INTRAVENOUS

## 2019-12-30 MED ORDER — PROPOFOL 10 MG/ML IV BOLUS
INTRAVENOUS | Status: DC | PRN
  Administered 2019-12-30: 90 mg via INTRAVENOUS

## 2019-12-30 MED ORDER — 0.9 % SODIUM CHLORIDE (POUR BTL) OPTIME
TOPICAL | Status: DC | PRN
  Administered 2019-12-30: 1000 mL

## 2019-12-30 MED ORDER — HYDROCODONE-ACETAMINOPHEN 5-325 MG PO TABS: 1.0000 | ORAL_TABLET | Freq: Four times a day (QID) | ORAL | 0 refills | Status: AC | PRN

## 2019-12-30 MED ORDER — ORAL CARE MOUTH RINSE: 15.0000 mL | Freq: Once | OROMUCOSAL | Status: AC

## 2019-12-30 MED ORDER — ROCURONIUM BROMIDE 10 MG/ML (PF) SYRINGE
PREFILLED_SYRINGE | INTRAVENOUS | Status: AC
  Filled 2019-12-30: qty 20

## 2019-12-30 MED ORDER — AMOXICILLIN 500 MG PO CAPS: 500.0000 mg | ORAL_CAPSULE | Freq: Three times a day (TID) | ORAL | 0 refills | Status: AC

## 2019-12-30 MED ORDER — NEOSTIGMINE METHYLSULFATE 3 MG/3ML IV SOSY
PREFILLED_SYRINGE | INTRAVENOUS | Status: AC
  Filled 2019-12-30: qty 3

## 2019-12-30 SURGICAL SUPPLY — 32 items
ALCOHOL 70% 16 OZ (MISCELLANEOUS) ×2 IMPLANT
BANDAGE HEMOSTAT MRDH 4X4 STRL (MISCELLANEOUS) IMPLANT
BLADE SURG 15 STRL LF DISP TIS (BLADE) ×1 IMPLANT
BLADE SURG 15 STRL SS (BLADE) ×2
BNDG HEMOSTAT MRDH 4X4 STRL (MISCELLANEOUS)
COVER SURGICAL LIGHT HANDLE (MISCELLANEOUS) ×2 IMPLANT
COVER WAND RF STERILE (DRAPES) ×2 IMPLANT
GAUZE 4X4 16PLY RFD (DISPOSABLE) ×2 IMPLANT
GAUZE PACKING FOLDED 2  STR (GAUZE/BANDAGES/DRESSINGS) ×2
GAUZE PACKING FOLDED 2 STR (GAUZE/BANDAGES/DRESSINGS) ×1 IMPLANT
GLOVE BIO SURGEON STRL SZ 6.5 (GLOVE) ×4 IMPLANT
GOWN STRL REUS W/ TWL LRG LVL3 (GOWN DISPOSABLE) ×2 IMPLANT
GOWN STRL REUS W/TWL LRG LVL3 (GOWN DISPOSABLE) ×4
KIT BASIN OR (CUSTOM PROCEDURE TRAY) ×2 IMPLANT
KIT TURNOVER KIT B (KITS) ×2 IMPLANT
MANIFOLD NEPTUNE II (INSTRUMENTS) ×2 IMPLANT
NEEDLE BLUNT 16X1.5 OR ONLY (NEEDLE) IMPLANT
NS IRRIG 1000ML POUR BTL (IV SOLUTION) ×2 IMPLANT
PACK EENT II TURBAN DRAPE (CUSTOM PROCEDURE TRAY) ×2 IMPLANT
PAD ARMBOARD 7.5X6 YLW CONV (MISCELLANEOUS) ×2 IMPLANT
SPONGE SURGIFOAM ABS GEL SZ50 (HEMOSTASIS) ×2 IMPLANT
SUCTION FRAZIER HANDLE 10FR (MISCELLANEOUS) ×2
SUCTION TUBE FRAZIER 10FR DISP (MISCELLANEOUS) ×1 IMPLANT
SUT CHROMIC 3 0 PS 2 (SUTURE) ×4 IMPLANT
SUT CHROMIC 4 0 P 3 18 (SUTURE) IMPLANT
SYR 50ML SLIP (SYRINGE) ×2 IMPLANT
SYR BULB IRRIG 60ML STRL (SYRINGE) ×2 IMPLANT
TOWEL GREEN STERILE FF (TOWEL DISPOSABLE) ×2 IMPLANT
TUBE CONNECTING 12X1/4 (SUCTIONS) ×2 IMPLANT
WATER STERILE IRR 1000ML POUR (IV SOLUTION) ×2 IMPLANT
WATER TABLETS ICX (MISCELLANEOUS) ×2 IMPLANT
YANKAUER SUCT BULB TIP NO VENT (SUCTIONS) ×2 IMPLANT

## 2019-12-30 NOTE — Op Note (Signed)
OPERATIVE NOTE  Patient Name:  Elijah White Date of Birth:   1940-03-26 Medical Record Number: [id number]  DATE OF SURGERY:  12/30/2019  SURGEON: Aden Youngman B. Benson Norway, DMD  ASSISTANT: Molli Posey, DAII  PREOPERATIVE DIAGNOSES: Dental caries, periodontal disease  Patient Active Problem List   Diagnosis Date Noted  . Near syncope 12/13/2019  . Severe aortic stenosis   . ESRD (end stage renal disease) ([place]) 11/26/2019  . Macrocytic anemia 11/26/2019  . 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 Chest pain 11/26/2019  . ARF (acute renal failure) ([place]) 03/25/2018  . Chronic diastolic CHF (congestive heart failure) ([place]) 03/25/2018  . 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 Vision loss of right eye 11/11/2017  . Acute decompensated heart failure ([place]) 09/01/2017  . Bilateral pseudophakia 07/11/2017  . 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 GERD (gastroesophageal reflux disease) 09/10/2016  . HLD (hyperlipidemia) 09/10/2016  . Dizziness 08/13/2016  . Renal failure (ARF), acute on chronic (HCC) 08/13/2016  . Essential hypertension 08/13/2016  . Cataract, nuclear 10/29/2012  . 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  . Type 2 diabetes mellitus with renal manifestations ([place]) 10/22/2006  . BPH (benign prostatic hyperplasia) 10/22/2006  . COLONIC POLYPS, HX OF 10/22/2006   Dental caries, periodontal disease  POSTOPERATIVE DIAGNOSES: Same  PROCEDURES PERFORMED: 1. Extractions of teeth numbers 4, 5, 6, 7, 8, 9, 10, 11, 12, 13 and 17. 2. 2 Quadrants of alveoloplasty 3. Gross debridement of remaining dentition  ANESTHESIA: General anesthesia via nasal endotracheal tube.  MEDICATIONS: 1. Ancef 2 g IV prior to invasive dental procedures. 2. Local anesthesia with a total utilization of 3 cartridges of 34 mg of lidocaine with 0.018 mg of epinephrine/ea as well as 1 cartridge with 9 mg of bupivacaine with 0.009 mg of  epinephrine/ea.  SPECIMENS: 11 teeth that were extracted and discarded.  DRAINS/CULTURES: None  COMPLICATIONS: None  ESTIMATED BLOOD LOSS: 5 mL  INTRAVENOUS FLUIDS: 10 mL of Lactated ringers solution.  INDICATIONS: The patient was recently diagnosed with severe AS anticipating TAVR.  A medically necessary dental consult was then requested to evaluate the patient prior to cardiac intervention.  The patient was examined and treatment planned for extractions, FMD and alveoloplasty PRN.  This treatment plan was formulated to decrease the risks and complications associated with dental infection from affecting the patient's systemic health and perioperative/post-operative complications.  OPERATIVE FINDINGS: The patient was examined in operating room number 6.  The indicated teeth were identified for extraction. 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  DESCRIPTION OF PROCEDURE: Patient was identified in the holding area and brought to the main operating room number 6 by the anesthesia team. Patient was then placed in the supine position on the operating table. General anesthesia was then induced per the anesthesia team. The patient was then prepped and draped in the usual sterile fashion for dental medicine procedures. A timeout was performed. The patient was identified and procedures were verified. A throat pack was placed at this time. The oral cavity was then thoroughly examined with the findings noted above. The patient was then ready for dental medicine procedure as follows:  ROUTINE EXTRACTIONS: Local anesthesia was administered sequentially with a total utilization of 3 cartridges each containing 34 mg of lidocaine with 0.018 mg of epinephrine as well as 1 cartridge each containing 9 mg bupivacaine with 0.009 mg of epinephrine. Location of anesthesia included maxillary  infiltration and palatal injections of  indicated teeth, and infiltration of tooth #17.  The Maxillary left and right quadrants first approached. The teeth were then subluxated with a series of straight elevators. Tooth numbers 4, 5, 6, 7, 8, 9, 10, 11, 12 and 13 were then removed with a 150 forceps without complications. Alveoloplasty was then performed utilizing a ronguers and bone file. The surgical site was then irrigated with copious amounts of sterile saline. The tissues were approximated and trimmed appropriately. Surgi Foam was placed in each extraction site. The surgical sites were all closed using 3-0 chromic gut sutures as follows: URQ continuous interlocking suture and ULQ 2 figure-8 sutures.  The Mandibular left and right quadrants were then approached. The teeth were subluxated with a series of straight elevators. Tooth number 17 was then removed utilizing a 151 forceps. Alveoloplasty was then performed utilizing a rongeurs and bone file to help achieve primary closure. The tissues were approximated and trimmed appropriately. The surgical sites were then irrigated with copious amounts of sterile saline. Surgi Foam was placed in each extraction site.  Debridement of the remaining mandibular teeth completed with hand scalers.  END OF PROCEDURE: Thorough oral irrigation with sterile saline was performed. The patient was examined for complications, seeing none, the dental medicine procedure was deemed to be complete. The throat pack was removed at this time. An oral airway was then placed at the request of the anesthesia team. A series of 4 x 4 gauze were placed in the mouth to aid hemostasis as needed. The patient was then handed over to the anesthesia team for final disposition. After an appropriate amount of time, the patient was extubated and taken to the postanesthsia care unit in stable condition. All counts were correct for the dental medicine procedure.   [place] Benson Norway, DMD

## 2019-12-30 NOTE — Progress Notes (Signed)
PRE-OPERATIVE NOTE  12/30/2019 Elijah White [id number]  VITALS: BP 114/64   Pulse 85   Temp 97.9 F (36.6 C)   Resp 20   Ht 5\' 1"  (1.549 m)   Wt 75.8 kg   SpO2 100%   BMI 31.57 kg/m   Lab Results  Component Value Date   WBC 5.3 12/15/2019   HGB 13.6 12/30/2019   HCT 40.0 12/30/2019   MCV 101.5 (H) 12/15/2019   PLT 222 12/15/2019   BMET    Component Value Date/Time   NA 142 12/30/2019 0627   NA 134 (A) 09/11/2019 0000   K 4.3 12/30/2019 0627   CL 101 12/30/2019 0627   CO2 28 12/15/2019 0820   GLUCOSE 176 (H) 12/30/2019 0627   BUN 37 (H) 12/30/2019 0627   BUN 34 (A) 09/11/2019 0000   CREATININE 7.80 (H) 12/30/2019 0627   CALCIUM 9.9 12/15/2019 0820   GFRNONAA 5 (L) 12/15/2019 0820   GFRAA 7 (L) 12/03/2019 0710    Lab Results  Component Value Date   INR 1.0 11/26/2019   INR 1.11 03/25/2018   No results found for: PTT   Elijah White presents for dental procedures in the operating room.   SUBJECTIVE: The patient denies any acute medical or dental changes and agrees to proceed with treatment as planned.   EXAM: No sign of acute dental changes.   ASSESSMENT: Patient is affected by acute chronic diastolic heart failure and dental surgery needed for pre-cardiac surgical intervention (TAVR).   PLAN: Patient agrees to proceed with treatment as planned in the operating room as previously discussed and accepts the risks, benefits, and complications of the proposed treatment. Patient is aware of the risk for bleeding, bruising, swelling, infection, pain, nerve damage, sinus involvement, root tip fracture, mandible fracture, and the risks of complications associated with the anesthesia. Patient also is aware of the potential for other complications not mentioned above.   Solvang Benson [place], DMD

## 2019-12-30 NOTE — Anesthesia Postprocedure Evaluation (Signed)
Anesthesia Post Note  Patient: Elijah White  Procedure(s) Performed: MULTIPLE EXTRACTION WITH ALVEOLOPLASTY (N/A Mouth)     Patient location during evaluation: PACU Anesthesia Type: General Level of consciousness: awake and alert, oriented and patient cooperative Pain management: pain level controlled Vital Signs Assessment: post-procedure vital signs reviewed and stable Respiratory status: spontaneous breathing, nonlabored ventilation and respiratory function stable Cardiovascular status: blood pressure returned to baseline and stable Postop Assessment: no apparent nausea or vomiting Anesthetic complications: no Comments: C/o abdominal pain, states he needs to use restroom   No complications documented.  Last Vitals:  Vitals:   12/30/19 1024 12/30/19 1025  BP: 120/61   Pulse: 68 73  Resp: 15 14  Temp:  (!) 36.2 C  SpO2: 96% 96%    Last Pain:  Vitals:   12/30/19 0909  PainSc: [place]

## 2019-12-30 NOTE — Anesthesia Procedure Notes (Signed)
Arterial Line Insertion Start/End10/27/2021 7:02 AM, 12/30/2019 7:12 AM Performed by: Eligha Bridegroom, CRNA  radial was placed Catheter size: 20 G Hand hygiene performed  and maximum sterile barriers used   Attempts: 1 Procedure performed without using ultrasound guided technique. Following insertion, dressing applied and Biopatch. Post procedure assessment: normal  Patient tolerated the procedure well with no immediate complications.

## 2019-12-30 NOTE — Interval H&P Note (Signed)
History and Physical Interval Note:  12/30/2019 7:07 AM  Elijah White  has presented today for surgery, with the diagnosis of dental carries.  The various methods of treatment have been discussed with the patient and family. After consideration of risks, benefits and other options for treatment, the patient has consented to  Procedure(s): MULTIPLE EXTRACTION WITH ALVEOLOPLASTY (N/A) as a surgical intervention.  The patient's history has been reviewed, patient examined, no change in status, stable for surgery.  I have reviewed the patient's chart and labs.  Questions were answered to the patient's satisfaction.     Charlaine Dalton

## 2019-12-30 NOTE — Discharge Instructions (Signed)
[facility] Dr. Kristi Hyer B. Smitty Ackerley, DMD Phone: [phone] Fax: [phone]    MOUTH CARE AFTER SURGERY   FACTS:  Ice used in ice bag helps keep the swelling down, and can help lessen the pain.  It is easier to treat pain BEFORE it happens.  Spitting disturbs the clot and may cause bleeding to start again, or to get worse.  Smoking delays healing and can cause complications.  Sharing prescriptions can be dangerous.  Do not take medications not recently prescribed for you.  Antibiotics may stop birth control pills from working.  Use other means of birth control while on antibiotics.  Warm salt water rinses after the first 24 hours will help lessen the swelling:  Use 1/2 teaspoonful of table salt per oz.of water.  DO NOT:  Do not spit.    Do not drink through a straw.  Strongly advised not to smoke, dip snuff or chew tobacco at least for 3 days.  Do not eat sharp or crunchy foods.  Avoid the area of surgery when chewing.  Do not stop your antibiotics before your instructions say to do so.  Do not eat hot foods until bleeding has stopped.  If you need to, let your food cool down to room temperature.  EXPECT:  Some swelling, especially first 2-3 days.  Soreness or discomfort in varying degrees.  Follow your dentist's instructions about how to handle pain before it starts.  Pinkish saliva or light blood in saliva, or on your pillow in the morning.  This can last around 24 hours.  Bruising inside or outside the mouth.  This may not show up until 2-3 days after surgery.  Don't worry, it will go away in time.  Pieces of "bone" may work themselves loose.  It's OK.  If they bother you, let us know.  WHAT TO DO IMMEDIATELY AFTER SURGERY:  Bite on gauze with steady pressure for 30-45 minutes at a time.  Switch out the gauze after 30-45 minutes for clean gauze, and continue this for 1-2 hours or until bleeding subsides.  Do not chew on the  gauze.  Do not lie down flat.  Raise your head support especially for the first 24 hours.  Apply ice to your face on the side of the surgery.  You may apply it 20 minutes on and a few minutes off.  Ice for 8-12 hours.  You may use ice up to 24 hours.  Before the numbness wears off, take a pain pill as instructed.  Prescription pain medication is not always required.  SWELLING:  Expect swelling for the first couple of days.  It should get better after that.  If swelling increases 3 days or so after surgery, let us know as soon as possible.  FEVER:  Take Tylenol every 4 hours if needed to lower your temperature, especially if it is at 100F or higher.  Drink lots of fluids.  If the fever does not go away, let us know.  BREATHING TROUBLE:  Any unusual difficulty breathing means you have to have someone bring you to the emergency room ASAP.  BLEEDING:  Light oozing is expected for 24 hours or so.  Prop head up with pillows.  Do not spit.  Do not confuse bright red fresh flowing blood with lots of saliva colored with a little bit of blood.  If you notice some bleeding, place gauze or a tea bag where it is bleeding and apply CONSTANT pressure by biting   down for 1 hour.  Avoid talking during this time.  Do not remove the gauze or tea bag during this hour to "check" the bleeding.  If you notice bright RED bleeding FLOWING out of particular area, and filling the floor of your mouth, put a wad of gauze on that area, bite down firmly and constantly.  Call us immediately.  If we're closed, have someone bring you to the emergency room.  ORAL HYGIENE:  Brush your teeth as usual after meals and before bedtime.  Use a soft toothbrush around the area of surgery.  DO NOT AVOID BRUSHING.  Otherwise bacteria(germs) will grow and may delay healing or encourage infection.  Since you cannot spit, just gently rinse and let the water flow out of your mouth.  DO NOT SWISH  HARD.  EATING:  Cool liquids are a good point to start.  Increase to soft foods as tolerated.  PRESCRIPTIONS:  Follow the directions for your prescriptions exactly as written.  If your doctor gave you a narcotic pain medication, do not drive, operate machinery or drink alcohol when on that medication.  QUESTIONS:  Call our office during office hours [phone] or call the Emergency Room at [phone]. 

## 2019-12-30 NOTE — Anesthesia Procedure Notes (Signed)
Procedure Name: Intubation Date/Time: 12/30/2019 7:50 AM Performed by: Eligha Bridegroom, CRNA Pre-anesthesia Checklist: Patient identified, Emergency Drugs available, Suction available, Patient being monitored and Timeout performed Patient Re-evaluated:Patient Re-evaluated prior to induction Preoxygenation: Pre-oxygenation with 100% oxygen Induction Type: IV induction Ventilation: Mask ventilation without difficulty Laryngoscope Size: Glidescope Nasal Tubes: Right, Nasal prep performed and Nasal Rae Tube size: 7.5 mm Number of attempts: 1 Airway Equipment and Method: Video-laryngoscopy Placement Confirmation: positive ETCO2,  breath sounds checked- equal and bilateral and ETT inserted through vocal cords under direct vision Tube secured with: Tape Dental Injury: Teeth and Oropharynx as per pre-operative assessment

## 2019-12-30 NOTE — Transfer of Care (Signed)
Immediate Anesthesia Transfer of Care Note  Patient: Elijah White  Procedure(s) Performed: MULTIPLE EXTRACTION WITH ALVEOLOPLASTY (N/A Mouth)  Patient Location: PACU  Anesthesia Type:General  Level of Consciousness: awake and alert   Airway & Oxygen Therapy: Patient Spontanous Breathing  Post-op Assessment: Report given to RN and Post -op Vital signs reviewed and stable  Post vital signs: Reviewed and stable  Last Vitals:  Vitals Value Taken Time  BP 120/65 12/30/19 0909  Temp    Pulse 65 12/30/19 0912  Resp 13 12/30/19 0912  SpO2 93 % 12/30/19 0912  Vitals shown include unvalidated device data.  Last Pain:  Vitals:   12/30/19 0616  PainSc: 0-No pain         Complications: No complications documented.

## 2019-12-31 ENCOUNTER — Telehealth (HOSPITAL_COMMUNITY): Payer: Self-pay | Admitting: Dentistry

## 2019-12-31 ENCOUNTER — Encounter (HOSPITAL_COMMUNITY): Payer: Self-pay | Admitting: Dentistry

## 2019-12-31 DIAGNOSIS — N186 End stage renal disease: Secondary | ICD-10-CM | POA: Diagnosis not present

## 2019-12-31 DIAGNOSIS — E118 Type 2 diabetes mellitus with unspecified complications: Secondary | ICD-10-CM | POA: Diagnosis not present

## 2019-12-31 DIAGNOSIS — Z992 Dependence on renal dialysis: Secondary | ICD-10-CM | POA: Diagnosis not present

## 2019-12-31 DIAGNOSIS — Z23 Encounter for immunization: Secondary | ICD-10-CM | POA: Diagnosis not present

## 2019-12-31 DIAGNOSIS — D631 Anemia in chronic kidney disease: Secondary | ICD-10-CM | POA: Diagnosis not present

## 2019-12-31 DIAGNOSIS — N2581 Secondary hyperparathyroidism of renal origin: Secondary | ICD-10-CM | POA: Diagnosis not present

## 2020-01-02 DIAGNOSIS — E118 Type 2 diabetes mellitus with unspecified complications: Secondary | ICD-10-CM | POA: Diagnosis not present

## 2020-01-02 DIAGNOSIS — D631 Anemia in chronic kidney disease: Secondary | ICD-10-CM | POA: Diagnosis not present

## 2020-01-02 DIAGNOSIS — N2581 Secondary hyperparathyroidism of renal origin: Secondary | ICD-10-CM | POA: Diagnosis not present

## 2020-01-02 DIAGNOSIS — Z23 Encounter for immunization: Secondary | ICD-10-CM | POA: Diagnosis not present

## 2020-01-02 DIAGNOSIS — Z992 Dependence on renal dialysis: Secondary | ICD-10-CM | POA: Diagnosis not present

## 2020-01-02 DIAGNOSIS — N186 End stage renal disease: Secondary | ICD-10-CM | POA: Diagnosis not present

## 2020-01-03 DIAGNOSIS — N186 End stage renal disease: Secondary | ICD-10-CM | POA: Diagnosis not present

## 2020-01-03 DIAGNOSIS — Z992 Dependence on renal dialysis: Secondary | ICD-10-CM | POA: Diagnosis not present

## 2020-01-03 DIAGNOSIS — I129 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 kidney disease, or unspecified chronic kidney disease: Secondary | ICD-10-CM | POA: Diagnosis not present

## 2020-01-04 ENCOUNTER — Telehealth: Payer: Medicare Other

## 2020-01-04 ENCOUNTER — Other Ambulatory Visit: Payer: Self-pay

## 2020-01-04 ENCOUNTER — Ambulatory Visit: Payer: Self-pay

## 2020-01-04 DIAGNOSIS — E1122 Type 2 diabetes mellitus with diabetic chronic kidney disease: Secondary | ICD-10-CM

## 2020-01-04 DIAGNOSIS — I5032 Chronic diastolic (congestive) heart failure: Secondary | ICD-10-CM

## 2020-01-04 DIAGNOSIS — N186 End stage renal disease: Secondary | ICD-10-CM

## 2020-01-04 DIAGNOSIS — I350 Nonrheumatic aortic (valve) stenosis: Secondary | ICD-10-CM

## 2020-01-04 DIAGNOSIS — N185 Chronic kidney disease, stage 5: Secondary | ICD-10-CM

## 2020-01-05 DIAGNOSIS — D509 Iron deficiency anemia, unspecified: Secondary | ICD-10-CM | POA: Diagnosis not present

## 2020-01-05 DIAGNOSIS — D631 Anemia in chronic kidney disease: Secondary | ICD-10-CM | POA: Diagnosis not present

## 2020-01-05 DIAGNOSIS — N186 End stage renal disease: Secondary | ICD-10-CM | POA: Diagnosis not present

## 2020-01-05 DIAGNOSIS — N2581 Secondary hyperparathyroidism of renal origin: Secondary | ICD-10-CM | POA: Diagnosis not present

## 2020-01-05 DIAGNOSIS — R52 Pain, unspecified: Secondary | ICD-10-CM | POA: Diagnosis not present

## 2020-01-05 DIAGNOSIS — Z992 Dependence on renal dialysis: Secondary | ICD-10-CM | POA: Diagnosis not present

## 2020-01-06 NOTE — Chronic Care Management (AMB) (Signed)
Chronic Care Management   Follow Up Note   01/04/2020 Name: Elijah White MRN: [id number] DOB: 01-Feb-1941  Referred by: [name], MD Reason for referral : Chronic Care Management (CCM RNCM FU Call )   Elijah White is a 79 y.o. year old male who is a primary care patient of [name], MD. The CCM team was consulted for assistance with chronic disease management and care coordination needs.    Review of patient status, including review of consultants reports, relevant laboratory and other test results, and collaboration with appropriate care team members and the patient's provider was performed as part of comprehensive patient evaluation and provision of chronic care management services.    SDOH (Social Determinants of Health) assessments performed: Yes - no acute challenges identified at this time  See Care Plan activities for detailed interventions related to [place])   Placed CCM RN CM outbound follow up call to patient for a care plan update.     Outpatient Encounter Medications as of 01/04/2020  Medication Sig Note  . acetaminophen (TYLENOL) 500 MG tablet Take 2 tablets (1,000 mg total) by mouth every 6 (six) hours as needed for mild pain, moderate pain or headache.   [name] amoxicillin (AMOXIL) 500 MG capsule Take 1 capsule (500 mg total) by mouth 3 (three) times daily for 7 days.   [name] apraclonidine (IOPIDINE) 0.5 % ophthalmic solution Place 1 drop into the right eye 3 (three) times daily.  12/13/2019: 2 doses today  . atorvastatin (LIPITOR) 10 MG tablet TAKE 1 TABLET BY MOUTH EVERY DAY (Patient taking differently: Take 10 mg by mouth daily. )   . B Complex-C-Folic Acid (DIALYVITE 536) 0.8 MG TABS Take 1 tablet by mouth daily.   . BD PEN NEEDLE NANO U/F 32G X 4 MM MISC USE SUBCUTANEOUS FOUR TIMES A DAY   . blood glucose meter kit and supplies KIT Dispense based on patient and insurance preference. Use up to four times daily as directed. (FOR ICD-9 250.00, 250.01). For QAC - HS  accuchecks.   . carvedilol (COREG) 3.125 MG tablet TAKE 1 TABLET(3.125 MG) BY MOUTH TWICE DAILY WITH A MEAL (Patient taking differently: Take 3.125 mg by mouth 2 (two) times daily with a meal. )   . diclofenac Sodium (VOLTAREN) 1 % GEL Apply 4 g topically 4 (four) times daily as needed. (Patient taking differently: Apply 4 g topically 4 (four) times daily as needed (pain). )   . dorzolamide-timolol (COSOPT) 22.3-6.8 MG/ML ophthalmic solution Place 1 drop into the right eye 2 (two) times daily.   [name] esomeprazole (NEXIUM) 40 MG capsule TAKE 1 CAPSULE BY MOUTH EVERY DAY (Patient taking differently: Take 40 mg by mouth every other day. )   . ferric citrate (AURYXIA) 1 GM 210 MG(Fe) tablet Take 420 mg by mouth 3 (three) times daily with meals.   . gabapentin (NEURONTIN) 100 MG capsule TAKE 1 CAPSULE(100 MG) BY MOUTH AT BEDTIME (Patient taking differently: Take 100 mg by mouth at bedtime. )   . hydrALAZINE (APRESOLINE) 25 MG tablet TAKE 1 TABLET(25 MG) BY MOUTH TWICE DAILY (Patient taking differently: Take 25 mg by mouth in the morning and at bedtime. )   . hydroxypropyl methylcellulose / hypromellose (ISOPTO TEARS / GONIOVISC) 2.5 % ophthalmic solution Place 1 drop into the left eye as needed for dry eyes.   . isosorbide mononitrate (IMDUR) 30 MG 24 hr tablet TAKE 1 TABLET(30 MG) BY MOUTH DAILY (Patient taking differently: Take 30 mg by mouth daily. )   .  Lancets (ONETOUCH DELICA PLUS [id number]) MISC USE AS DIRECTED UP TO FOUR TIMES A DAY   . latanoprost (XALATAN) 0.005 % ophthalmic solution Place 1 drop into the right eye at bedtime.   [name] test strip USE AS DIRECTED TO TEST FOUR TIMES A DAY   . pilocarpine (PILOCAR) 4 % ophthalmic solution Place 1 drop into the right eye 4 (four) times daily.    . tamsulosin (FLOMAX) 0.4 MG CAPS capsule Take 1 capsule (0.4 mg total) by mouth at bedtime. (Patient not taking: Reported on 12/23/2019)    No facility-administered encounter medications on file as  of 01/04/2020.     Objective:  Lab Results  Component Value Date   HGBA1C 7.5 (H) 11/26/2019   HGBA1C 6.7 09/11/2019   HGBA1C 6.3 (H) 03/25/2018   Lab Results  Component Value Date   MICROALBUR 150 02/19/2018   LDLCALC 103 09/11/2019   CREATININE 7.80 (H) 12/30/2019   BP Readings from Last 3 Encounters:  12/30/19 120/61  12/23/19 127/80  12/14/19 132/66    Goals Addressed      Patient Stated   .  "to get through my heart procedure that is coming up" (pt-stated)        CARE PLAN ENTRY (see longitudinal plan of care for additional care plan information)  Current Barriers:  [name] Knowledge Deficits related to evaluation and treatment of Severe Aortic Stenosis . Chronic Disease Management support and education needs related to Acute on chronic diastolic CHF; DM type 2; Essential hypertension; ESRD on hemodialysis; Glaucoma; Severe Aortic Stenosis  . Glaucoma, Severe Aortic Stenosis . Lacks caregiver support.   Nurse Case Manager Clinical Goal(s):  [name] Over the next 60 days, patient will work with his health care team  to address needs related to disease education and support for treatment of Severe Aortic Stenosis and post TAVR  CCM RN CM Interventions:  01/04/20 call completed with patient  . Inter-disciplinary care team collaboration (see longitudinal plan of care) . Evaluation of current treatment plan related to Severe Aortic Stenosis and patient's adherence to plan as established by provider . Determined patient will undergo a TAVR for severe aortic stenosis per Dr. Audie Box; Determined he will have pre-testing on 01/14/20 . Assessed for patient understanding about his procedure and what to expect, patient denies having questions at this time and is able to verbalize understanding . Discussed plans with patient for ongoing care management follow up and provided patient with direct contact information for care management team  Patient Self Care Activities:  . Self administers  medications as prescribed . Attends all scheduled provider appointments . Calls pharmacy for medication refills . Calls provider office for new concerns or questions . Supportive daughter to assist with care needs . Lacks social connections  Initial goal documentation       Other   .  To provide education and support to improve Self Health management of Diabetes   Not on track     CARE PLAN ENTRY (see longtitudinal plan of care for additional care plan information)  Current Barriers:  [name] Knowledge Deficits related to disease process and Self Health management of DM . Lacks caregiver support.  . Chronic Disease Management support and education needs related to CHF, DMII, ESRD on HD, Glaucoma  Nurse Case Manager Clinical Goal(s):  . New 01/04/20 Over the next 180 days, patient will work with the CCM team and PCP to address needs related to disease education and support to improve Self Health management  of Diabetes Mellitus  CCM RN CM Interventions:  01/04/20 call completed with patient, daughter unavailable  . Evaluation of current treatment plan related to Diabetes Mellitus and patient's adherence to plan as established by provider . Provided education to patient re: A1c has increased to 7.5 %; Educated on target A1c <7.0; Educated on daily glycemic control FBS 80-130, <180 after meals; Re-educated on dietary and exercise recommendations  . 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 Discussed and considered having [name], patient's daughter check his CBG's when available, patient declined stating she will not have time, he would like to have me further discuss this [name] . Placed outbound call to daughter [name], she advised she is working and currently in a meeting, she will give me a call back . Discussed plans with patient for  ongoing care management follow up and provided patient with direct contact information for care management team . Provided patient with printed educational materials related to Managing Your Diabetes using Meal Planning; Grocery Shopping with Diabetes; Preventing Complications from Diabetes  Patient Self Care Activities:  . Self administers medications as prescribed . Attends all scheduled provider appointments . Calls pharmacy for medication refills . Calls provider office for new concerns or questions . Supportive daughter to assist with care needs  Please see past updates related to this goal by clicking on the "Past Updates" button in the selected goal        Plan:   Telephone follow up appointment with care management team member scheduled for: 01/27/20  Barb Merino, RN, BSN, CCM Care Management Coordinator [facility]  Direct Phone: [phone]

## 2020-01-06 NOTE — Patient Instructions (Signed)
Visit Information  Goals Addressed      Patient Stated   .  "to get through my heart procedure that is coming up" (pt-stated)        [name] (see longitudinal plan of care for additional care plan information)  Current Barriers:  [name] Knowledge Deficits related to evaluation and treatment of Severe Aortic Stenosis . Chronic Disease Management support and education needs related to Acute on chronic diastolic CHF; DM type 2; Essential hypertension; ESRD on hemodialysis; Glaucoma; Severe Aortic Stenosis  . Glaucoma, Severe Aortic Stenosis . Lacks caregiver support.   Nurse Case Manager Clinical Goal(s):  [name] Over the next 60 days, patient will work with his health care team  to address needs related to disease education and support for treatment of Severe Aortic Stenosis and post TAVR  CCM RN CM Interventions:  01/04/20 call completed with patient  . Inter-disciplinary care team collaboration (see longitudinal plan of care) . Evaluation of current treatment plan related to Severe Aortic Stenosis and patient's adherence to plan as established by provider . Determined patient will undergo a TAVR for severe aortic stenosis per Dr. Audie Box; Determined he will have pre-testing on 01/14/20 . Assessed for patient understanding about his procedure and what to expect, patient denies having questions at this time and is able to verbalize understanding . Discussed plans with patient for ongoing care management follow up and provided patient with direct contact information for care management team  Patient Self Care Activities:  . Self administers medications as prescribed . Attends all scheduled provider appointments . Calls pharmacy for medication refills . Calls provider office for new concerns or questions . Supportive daughter to assist with care needs . Lacks social connections  Initial goal documentation       Other   .  To provide education and support to improve Self Health  management of Diabetes   Not on track     CARE PLAN ENTRY (see longtitudinal plan of care for additional care plan information)  Current Barriers:  [name] Knowledge Deficits related to disease process and Self Health management of DM . Lacks caregiver support.  . Chronic Disease Management support and education needs related to CHF, DMII, ESRD on HD, Glaucoma  Nurse Case Manager Clinical Goal(s):  . New 01/04/20 Over the next 180 days, patient will work with the CCM team and PCP to address needs related to disease education and support to improve Self Health management of Diabetes Mellitus  CCM RN CM Interventions:  01/04/20 call completed with patient, daughter unavailable  . Evaluation of current treatment plan related to Diabetes Mellitus and patient's adherence to plan as established by provider . Provided education to patient re: A1c has increased to 7.5 %; Educated on target A1c <7.0; Educated on daily glycemic control FBS 80-130, <180 after meals; Re-educated on dietary and exercise recommendations  . 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 Discussed and considered having Elijah White, patient's daughter check his CBG's when available, patient declined stating she will not have time, he would like to have me further discuss this Elijah White . Placed outbound call to daughter Elijah White, she advised she is working and currently in a meeting, she will give me a call back . Discussed plans with patient for ongoing care management follow up and provided patient with direct contact information for  care management team . Provided patient with printed educational materials related to Managing Your Diabetes using Meal Planning; Grocery Shopping with Diabetes; Preventing Complications from Diabetes  Patient Self Care Activities:  . Self administers  medications as prescribed . Attends all scheduled provider appointments . Calls pharmacy for medication refills . Calls provider office for new concerns or questions . Supportive daughter to assist with care needs  Please see past updates related to this goal by clicking on the "Past Updates" button in the selected goal        Patient verbalizes understanding of instructions provided today.   Telephone follow up appointment with care management team member scheduled for: 01/27/20  Elijah Merino, RN, BSN, CCM Care Management Coordinator [place] [facility]  Direct Phone: [phone]

## 2020-01-07 DIAGNOSIS — N186 End stage renal disease: Secondary | ICD-10-CM | POA: Diagnosis not present

## 2020-01-07 DIAGNOSIS — D631 Anemia in chronic kidney disease: Secondary | ICD-10-CM | POA: Diagnosis not present

## 2020-01-07 DIAGNOSIS — Z992 Dependence on renal dialysis: Secondary | ICD-10-CM | POA: Diagnosis not present

## 2020-01-07 DIAGNOSIS — N2581 Secondary hyperparathyroidism of renal origin: Secondary | ICD-10-CM | POA: Diagnosis not present

## 2020-01-07 DIAGNOSIS — D509 Iron deficiency anemia, unspecified: Secondary | ICD-10-CM | POA: Diagnosis not present

## 2020-01-07 DIAGNOSIS — R52 Pain, unspecified: Secondary | ICD-10-CM | POA: Diagnosis not present

## 2020-01-08 ENCOUNTER — Ambulatory Visit: Payer: Medicare Other

## 2020-01-09 DIAGNOSIS — N2581 Secondary hyperparathyroidism of renal origin: Secondary | ICD-10-CM | POA: Diagnosis not present

## 2020-01-09 DIAGNOSIS — R52 Pain, unspecified: Secondary | ICD-10-CM | POA: Diagnosis not present

## 2020-01-09 DIAGNOSIS — D631 Anemia in chronic kidney disease: Secondary | ICD-10-CM | POA: Diagnosis not present

## 2020-01-09 DIAGNOSIS — N186 End stage renal disease: Secondary | ICD-10-CM | POA: Diagnosis not present

## 2020-01-09 DIAGNOSIS — Z992 Dependence on renal dialysis: Secondary | ICD-10-CM | POA: Diagnosis not present

## 2020-01-09 DIAGNOSIS — D509 Iron deficiency anemia, unspecified: Secondary | ICD-10-CM | POA: Diagnosis not present

## 2020-01-11 ENCOUNTER — Ambulatory Visit (HOSPITAL_COMMUNITY): Payer: Self-pay | Admitting: Dentistry

## 2020-01-11 ENCOUNTER — Other Ambulatory Visit: Payer: Self-pay

## 2020-01-11 DIAGNOSIS — I350 Nonrheumatic aortic (valve) stenosis: Secondary | ICD-10-CM

## 2020-01-11 DIAGNOSIS — Z01818 Encounter for other preprocedural examination: Secondary | ICD-10-CM

## 2020-01-11 NOTE — Progress Notes (Signed)
DENTAL VISIT  POST-OPERATIVE NOTE   Date:   01/11/2020 Patient Name:  Elijah White MRN:   [id number]  ______________________________  COVID 19 SCREENING: The patient does not symptoms concerning for COVID-19 infection (Including fever, chills, cough, or new SHORTNESS OF BREATH).     VITALS: BP (!) 145/62 (BP Location: Right Arm)   Pulse 68   Temp 98.1 F (36.7 C)    CHIEF COMPLAINT: "I am a little sore when I press on one spot, but I haven't had any pain."   . Patient presents today with his daughter, Elijah White, for a post-op visit s/p extractions of teeth 4, 5, 6, 7, 8, 9, 10, 11, 12, 13 and 17 on 10/27.  He is anticipating TAVR after recent diagnosis of severe AS. [name] Medical/dental history reviewed with the patient.  No changes reported.  He reports no issues after surgery and has been doing well.  He is interested in moving forward with dentures once he is healed.   Patient Active Problem List   Diagnosis Date Noted  . Near syncope 12/13/2019  . Severe aortic stenosis   . ESRD (end stage renal disease) (HCC) 11/26/2019  . Macrocytic anemia 11/26/2019  . Adrenal mass (HCC) 11/26/2019  . 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 Chest pain 11/26/2019  . ARF (acute renal failure) (HCC) 03/25/2018  . Chronic diastolic CHF (congestive heart failure) (HCC) 03/25/2018  . 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 Vision loss of right eye 11/11/2017  . Acute decompensated heart failure (HCC) 09/01/2017  . Bilateral pseudophakia 07/11/2017  . 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 GERD (gastroesophageal reflux disease) 09/10/2016  . HLD (hyperlipidemia) 09/10/2016  . Dizziness 08/13/2016  . Renal failure (ARF), acute on chronic (HCC) 08/13/2016  . Essential hypertension 08/13/2016  . Cataract, nuclear 10/29/2012  . 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  . Type 2 diabetes mellitus with renal manifestations (HCC) 10/22/2006  . BPH (benign prostatic hyperplasia) 10/22/2006   . COLONIC POLYPS, HX OF 10/22/2006   Past Medical History:  Diagnosis Date  . Anemia    low iron  . Arthritis   . Cancer [facility]) ?2006   prostate  . Colon polyps ~ 1993 and 2003   Dr Dorena Cookey, [facility].  08/2001 colonoscopy: tubular adenoma at cecum.    . 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 Elevated cholesterol with high triglycerides   . ESRD (end stage renal disease) (HCC)    [facility]  . GERD (gastroesophageal reflux disease)   . Hypertension   . Legally blind in left eye, as defined in [place]    has pinpoint vision in right eye   Past Surgical History:  Procedure Laterality Date  . AV FISTULA PLACEMENT Left 03/20/2018   Procedure: ARTERIOVENOUS (AV) FISTULA CREATION ARM;  Surgeon: Maeola Harman, MD;  Location[facility] OR;  Service: Vascular;  Laterality: Left;  . BASCILIC VEIN TRANSPOSITION Left 05/21/2018   Procedure: LEFT BASILIC VEIN FISTULA SECOND STAGE;  Surgeon: Maeola Harman, MD;  Location: [facility] OR;  Service: Vascular;  Laterality: Left;  . COLONOSCOPY    . COLONOSCOPY WITH PROPOFOL N/A 11/30/2019   Procedure: COLONOSCOPY WITH PROPOFOL;  Surgeon: Kerin Salen, MD;  Location: [facility] ENDOSCOPY;  Service: Gastroenterology;  Laterality: N/A;  . GLAUCOMA SURGERY  2019   multiple surgeries  . HEMOSTASIS CLIP PLACEMENT  11/30/2019   Procedure: HEMOSTASIS CLIP PLACEMENT;  Surgeon: Kerin Salen, MD;  Location: [facility] ENDOSCOPY;  Service: Gastroenterology;;  . KNEE ARTHROSCOPY    . MULTIPLE EXTRACTIONS WITH ALVEOLOPLASTY N/A 12/30/2019   Procedure: MULTIPLE EXTRACTION WITH ALVEOLOPLASTY;  Surgeon: Charlaine Dalton, DMD;  Location: [place];  Service: Dentistry;  Laterality: N/A;  . POLYPECTOMY  11/30/2019   Procedure: POLYPECTOMY;  Surgeon: Ronnette Juniper, MD;  Location: [place];  Service: Gastroenterology;;  . PROSTATE SURGERY    . RIGHT/LEFT HEART CATH AND CORONARY ANGIOGRAPHY N/A 12/03/2019   Procedure: RIGHT/LEFT HEART  CATH AND CORONARY ANGIOGRAPHY;  Surgeon: Burnell Blanks, MD;  Location: [place] CV LAB;  Service: Cardiovascular;  Laterality: N/A;  . SUBMUCOSAL TATTOO INJECTION  11/30/2019   Procedure: SUBMUCOSAL TATTOO INJECTION;  Surgeon: Ronnette Juniper, MD;  Location: [facility] ENDOSCOPY;  Service: Gastroenterology;;   Current Outpatient Medications  Medication Sig Dispense Refill  . acetaminophen (TYLENOL) 500 MG tablet Take 2 tablets (1,000 mg total) by mouth every 6 (six) hours as needed for mild pain, moderate pain or headache. 60 tablet 0  . apraclonidine (IOPIDINE) 0.5 % ophthalmic solution Place 1 drop into the right eye 3 (three) times daily.     [name] atorvastatin (LIPITOR) 10 MG tablet TAKE 1 TABLET BY MOUTH EVERY DAY (Patient taking differently: Take 10 mg by mouth daily. ) 90 tablet 0  . B Complex-C-Folic Acid (DIALYVITE 161) 0.8 MG TABS Take 1 tablet by mouth daily.    . BD PEN NEEDLE NANO U/F 32G X 4 MM MISC USE SUBCUTANEOUS FOUR TIMES A DAY    . blood glucose meter kit and supplies KIT Dispense based on patient and insurance preference. Use up to four times daily as directed. (FOR ICD-9 250.00, 250.01). For QAC - HS accuchecks. 1 each 1  . carvedilol (COREG) 3.125 MG tablet TAKE 1 TABLET(3.125 MG) BY MOUTH TWICE DAILY WITH A MEAL (Patient taking differently: Take 3.125 mg by mouth 2 (two) times daily with a meal. ) 180 tablet 1  . diclofenac Sodium (VOLTAREN) 1 % GEL Apply 4 g topically 4 (four) times daily as needed. (Patient taking differently: Apply 4 g topically 4 (four) times daily as needed (pain). ) 100 g 0  . dorzolamide-timolol (COSOPT) 22.3-6.8 MG/ML ophthalmic solution Place 1 drop into the right eye 2 (two) times daily.  11  . esomeprazole (NEXIUM) 40 MG capsule TAKE 1 CAPSULE BY MOUTH EVERY DAY (Patient taking differently: Take 40 mg by mouth every other day. ) 90 capsule 1  . ferric citrate (AURYXIA) 1 GM 210 MG(Fe) tablet Take 420 mg by mouth 3 (three) times daily with meals.    .  gabapentin (NEURONTIN) 100 MG capsule TAKE 1 CAPSULE(100 MG) BY MOUTH AT BEDTIME (Patient taking differently: Take 100 mg by mouth at bedtime. ) 90 capsule 1  . hydrALAZINE (APRESOLINE) 25 MG tablet TAKE 1 TABLET(25 MG) BY MOUTH TWICE DAILY (Patient taking differently: Take 25 mg by mouth in the morning and at bedtime. ) 180 tablet 1  . hydroxypropyl methylcellulose / hypromellose (ISOPTO TEARS / GONIOVISC) 2.5 % ophthalmic solution Place 1 drop into the left eye as needed for dry eyes.    . isosorbide mononitrate (IMDUR) 30 MG 24 hr tablet TAKE 1 TABLET(30 MG) BY MOUTH DAILY (Patient taking differently: Take 30 mg by mouth daily. ) 90 tablet 1  . Lancets (ONETOUCH DELICA PLUS [id number]) MISC USE AS DIRECTED UP TO FOUR TIMES A DAY    . latanoprost (XALATAN) 0.005 % ophthalmic solution Place 1 drop into the right eye at bedtime.    [name]  ONETOUCH VERIO test strip USE AS DIRECTED TO TEST FOUR TIMES A DAY 100 each 3  . pilocarpine (PILOCAR) 4 % ophthalmic solution Place 1 drop into the right eye 4 (four) times daily.     . tamsulosin (FLOMAX) 0.4 MG CAPS capsule Take 1 capsule (0.4 mg total) by mouth at bedtime. (Patient not taking: Reported on 12/23/2019) 30 capsule 0   No current facility-administered medications for this visit.   No Known Allergies  LABS: Lab Results  Component Value Date   WBC 5.3 12/15/2019   HGB 13.6 12/30/2019   HCT 40.0 12/30/2019   MCV 101.5 (H) 12/15/2019   PLT 222 12/15/2019   BMET    Component Value Date/Time   NA 142 12/30/2019 0627   NA 134 (A) 09/11/2019 0000   K 4.3 12/30/2019 0627   CL 101 12/30/2019 0627   CO2 28 12/15/2019 0820   GLUCOSE 176 (H) 12/30/2019 0627   BUN 37 (H) 12/30/2019 0627   BUN 34 (A) 09/11/2019 0000   CREATININE 7.80 (H) 12/30/2019 0627   CALCIUM 9.9 12/15/2019 0820   GFRNONAA 5 (L) 12/15/2019 0820   GFRAA 7 (L) 12/03/2019 0710    Lab Results  Component Value Date   INR 1.0 11/26/2019   INR 1.11 03/25/2018   No results found  for: PTT    EXAM: Extraction sites appear to be healing WNL.  No signs of wound dehiscence or infection evident upon examination.  No sutures appear to be remaining.   PROCEDURE: The patient was given a chlorhexidine gluconate rinse for 30 seconds. Irrigated extraction sites with sterile saline. Patient tolerated the procedure well.   ASSESSMENT: Post-operative course is consistent with dental procedures performed.   PLAN: 1. Patient is to call should any questions or issues arise.  Follow-up as needed; he is dentally optimized at this time for cardiac surgery. 2. Patient to return to choose an outside dental provider of his choice for routine dental care including cleanings, exams and replacement of missing teeth.  3. Recommend that he discuss plans to return to dentist for non-urgent treatment with MD to ensure they are medically optimized and to discuss pre-medication with antibiotic prophylaxis.   . All questions/concerns were addressed, and patient verbalized understanding of discussion and plan.   . Patient tolerated today's visit well and departed in stable condition.   [facility] [place], DMD

## 2020-01-12 DIAGNOSIS — N2581 Secondary hyperparathyroidism of renal origin: Secondary | ICD-10-CM | POA: Diagnosis not present

## 2020-01-12 DIAGNOSIS — D631 Anemia in chronic kidney disease: Secondary | ICD-10-CM | POA: Diagnosis not present

## 2020-01-12 DIAGNOSIS — N186 End stage renal disease: Secondary | ICD-10-CM | POA: Diagnosis not present

## 2020-01-12 DIAGNOSIS — Z992 Dependence on renal dialysis: Secondary | ICD-10-CM | POA: Diagnosis not present

## 2020-01-12 DIAGNOSIS — R52 Pain, unspecified: Secondary | ICD-10-CM | POA: Diagnosis not present

## 2020-01-12 DIAGNOSIS — D509 Iron deficiency anemia, unspecified: Secondary | ICD-10-CM | POA: Diagnosis not present

## 2020-01-13 ENCOUNTER — Other Ambulatory Visit (HOSPITAL_COMMUNITY): Payer: Self-pay | Admitting: Dentistry

## 2020-01-13 MED ORDER — CHLORHEXIDINE GLUCONATE 0.12 % MT SOLN: 15.0000 mL | Freq: Two times a day (BID) | OROMUCOSAL | 0 refills | Status: AC

## 2020-01-13 NOTE — Progress Notes (Signed)
[facility] [phone] - [place] AT [address] [place] [address] Phone: [phone] Fax: [phone]      Your procedure is scheduled on 01/19/20.  Report to [facility] Main Entrance "A" at 10:00 A.M., and check in at the Admitting office.  Call this number if you have problems the morning of surgery:  [phone]  Call [phone] if you have any questions prior to your surgery date Monday-Friday 8am-4pm    Remember:  Do not eat or drink after midnight the night before your surgery    **Continue taking all current medications without change through the day before surgery. On the morning of surgery do not take any medications.   HOW TO MANAGE YOUR DIABETES BEFORE AND AFTER SURGERY  Why is it important to control my blood sugar before and after surgery? . Improving blood sugar levels before and after surgery helps healing and can limit problems. . A way of improving blood sugar control is eating a healthy diet by: o  Eating less sugar and carbohydrates o  Increasing activity/exercise o  Talking with your doctor about reaching your blood sugar goals . High blood sugars (greater than 180 mg/dL) can raise your risk of infections and slow your recovery, so you will need to focus on controlling your diabetes during the weeks before surgery. . Make sure that the doctor who takes care of your diabetes knows about your planned surgery including the date and location.  How do I manage my blood sugar before surgery? . Check your blood sugar at least 4 times a day, starting 2 days before surgery, to make sure that the level is not too high or low. o Check your blood sugar the morning of your surgery when you wake up and every 2 hours until you get to the Short Stay unit. . If your blood sugar is less than 70 mg/dL, you will need to treat for low blood sugar: o Do not take insulin. o Treat a low blood  sugar (less than 70 mg/dL) with  cup of clear juice (cranberry or apple), 4 glucose tablets, OR glucose gel. Recheck blood sugar in 15 minutes after treatment (to make sure it is greater than 70 mg/dL). If your blood sugar is not greater than 70 mg/dL on recheck, call [phone] o  for further instructions. . Report your blood sugar to the short stay nurse when you get to Short Stay.  . If you are admitted to the hospital after surgery: o Your blood sugar will be checked by the staff and you will probably be given insulin after surgery (instead of oral diabetes medicines) to make sure you have good blood sugar levels. o The goal for blood sugar control after surgery is 80-180 mg/dL.      As of today, STOP taking any Aspirin (unless otherwise instructed by your surgeon) Aleve, Naproxen, Ibuprofen, Motrin, Advil, Goody's, BC's, all herbal medications, fish oil, and all vitamins.                      Do not wear jewelry, make up, or nail polish            Do not wear lotions, powders, perfumes/colognes, or deodorant.            Do not shave 48 hours prior to surgery.  Men may shave face and neck.  Do not bring valuables to the hospital.            [facility] is not responsible for any belongings or valuables.  Do NOT Smoke (Tobacco/Vaping) or drink Alcohol 24 hours prior to your procedure If you use a CPAP at night, you may bring all equipment for your overnight stay.   Contacts, glasses, dentures or bridgework may not be worn into surgery.      For patients admitted to the hospital, discharge time will be determined by your treatment team.   Patients discharged the day of surgery will not be allowed to drive home, and someone needs to stay with them for 24 hours.    Special instructions:   [place]- Preparing For Surgery  Before surgery, you can play an important role. Because skin is not sterile, your skin needs to be as free of germs as possible. You can reduce the  number of germs on your skin by washing with CHG (chlorahexidine gluconate) Soap before surgery.  CHG is an antiseptic cleaner which kills germs and bonds with the skin to continue killing germs even after washing.    Oral Hygiene is also important to reduce your risk of infection.  Remember - BRUSH YOUR TEETH THE MORNING OF SURGERY WITH YOUR REGULAR TOOTHPASTE  Please do not use if you have an allergy to CHG or antibacterial soaps. If your skin becomes reddened/irritated stop using the CHG.  Do not shave (including legs and underarms) for at least 48 hours prior to first CHG shower. It is OK to shave your face.  Please follow these instructions carefully.   1. Shower the NIGHT BEFORE SURGERY and the MORNING OF SURGERY with CHG Soap.   2. If you chose to wash your hair, wash your hair first as usual with your normal shampoo.  3. After you shampoo, rinse your hair and body thoroughly to remove the shampoo.  4. Use CHG as you would any other liquid soap. You can apply CHG directly to the skin and wash gently with a scrungie or a clean washcloth.   5. Apply the CHG Soap to your body ONLY FROM THE NECK DOWN.  Do not use on open wounds or open sores. Avoid contact with your eyes, ears, mouth and genitals (private parts). Wash Face and genitals (private parts)  with your normal soap.   6. Wash thoroughly, paying special attention to the area where your surgery will be performed.  7. Thoroughly rinse your body with warm water from the neck down.  8. DO NOT shower/wash with your normal soap after using and rinsing off the CHG Soap.  9. Pat yourself dry with a CLEAN TOWEL.  10. Wear CLEAN PAJAMAS to bed the night before surgery  11. Place CLEAN SHEETS on your bed the night of your first shower and DO NOT SLEEP WITH PETS.   Day of Surgery: Wear Clean/Comfortable clothing the morning of surgery Do not apply any deodorants/lotions.   Remember to brush your teeth WITH YOUR REGULAR TOOTHPASTE.    Please read over the following fact sheets that you were given.

## 2020-01-14 ENCOUNTER — Ambulatory Visit (HOSPITAL_COMMUNITY)
Admission: RE | Admit: 2020-01-14 | Discharge: 2020-01-14 | Disposition: A | Discharge status: Home / Self Care | Payer: Medicare Other | Source: Ambulatory Visit | Attending: Cardiovascular Disease | Admitting: Cardiovascular Disease

## 2020-01-14 ENCOUNTER — Encounter (HOSPITAL_COMMUNITY): Payer: Self-pay

## 2020-01-14 ENCOUNTER — Encounter (HOSPITAL_COMMUNITY)
Admission: RE | Admit: 2020-01-14 | Discharge: 2020-01-14 | Disposition: A | Discharge status: Home / Self Care | Payer: Medicare Other | Source: Ambulatory Visit | Attending: Cardiovascular Disease | Admitting: Cardiovascular Disease

## 2020-01-14 ENCOUNTER — Other Ambulatory Visit: Payer: Self-pay

## 2020-01-14 DIAGNOSIS — Z01818 Encounter for other preprocedural examination: Secondary | ICD-10-CM | POA: Insufficient documentation

## 2020-01-14 DIAGNOSIS — I350 Nonrheumatic aortic (valve) stenosis: Secondary | ICD-10-CM | POA: Diagnosis not present

## 2020-01-14 DIAGNOSIS — J90 Pleural effusion, not elsewhere classified: Secondary | ICD-10-CM | POA: Diagnosis not present

## 2020-01-14 DIAGNOSIS — N186 End stage renal disease: Secondary | ICD-10-CM | POA: Diagnosis not present

## 2020-01-14 DIAGNOSIS — N2581 Secondary hyperparathyroidism of renal origin: Secondary | ICD-10-CM | POA: Diagnosis not present

## 2020-01-14 DIAGNOSIS — Z992 Dependence on renal dialysis: Secondary | ICD-10-CM | POA: Diagnosis not present

## 2020-01-14 DIAGNOSIS — D509 Iron deficiency anemia, unspecified: Secondary | ICD-10-CM | POA: Diagnosis not present

## 2020-01-14 DIAGNOSIS — D631 Anemia in chronic kidney disease: Secondary | ICD-10-CM | POA: Diagnosis not present

## 2020-01-14 DIAGNOSIS — R52 Pain, unspecified: Secondary | ICD-10-CM | POA: Diagnosis not present

## 2020-01-14 LAB — PROTIME-INR
INR: 1 (ref 0.8–1.2)
Prothrombin Time: 12.8 seconds (ref 11.4–15.2)

## 2020-01-14 LAB — COMPREHENSIVE METABOLIC PANEL
ALT: 10 U/L (ref 0–44)
AST: 23 U/L (ref 15–41)
Albumin: 3.6 g/dL (ref 3.5–5.0)
Alkaline Phosphatase: 91 U/L (ref 38–126)
Anion gap: 16 — ABNORMAL HIGH (ref 5–15)
BUN: 40 mg/dL — ABNORMAL HIGH (ref 8–23)
CO2: 22 mmol/L (ref 22–32)
Calcium: 9.7 mg/dL (ref 8.9–10.3)
Chloride: 100 mmol/L (ref 98–111)
Creatinine, Ser: 9.18 mg/dL — ABNORMAL HIGH (ref 0.61–1.24)
GFR, Estimated: 5 mL/min — ABNORMAL LOW (ref >60)
Glucose, Bld: 257 mg/dL — ABNORMAL HIGH (ref 70–99)
Potassium: 4.7 mmol/L (ref 3.5–5.1)
Sodium: 138 mmol/L (ref 135–145)
Total Bilirubin: 0.6 mg/dL (ref 0.3–1.2)
Total Protein: 6.6 g/dL (ref 6.5–8.1)

## 2020-01-14 LAB — URINALYSIS, ROUTINE W REFLEX MICROSCOPIC
Bacteria, UA: NONE SEEN
Bilirubin Urine: NEGATIVE
Glucose, UA: 50 mg/dL — AB
Hgb urine dipstick: NEGATIVE
Ketones, ur: NEGATIVE mg/dL
Leukocytes,Ua: NEGATIVE
Nitrite: NEGATIVE
Protein, ur: 100 mg/dL — AB
Specific Gravity, Urine: 1.014 (ref 1.005–1.030)
pH: 7 (ref 5.0–8.0)

## 2020-01-14 LAB — CBC
HCT: 34.3 % — ABNORMAL LOW (ref 39.0–52.0)
Hemoglobin: 10.7 g/dL — ABNORMAL LOW (ref 13.0–17.0)
MCH: 32.7 pg (ref 26.0–34.0)
MCHC: 31.2 g/dL (ref 30.0–36.0)
MCV: 104.9 fL — ABNORMAL HIGH (ref 80.0–100.0)
Platelets: 206 10*3/uL (ref 150–400)
RBC: 3.27 MIL/uL — ABNORMAL LOW (ref 4.22–5.81)
RDW: 12.9 % (ref 11.5–15.5)
WBC: 5.2 10*3/uL (ref 4.0–10.5)
nRBC: 0 % (ref 0.0–0.2)

## 2020-01-14 LAB — BRAIN NATRIURETIC PEPTIDE: B Natriuretic Peptide: 335.4 pg/mL — ABNORMAL HIGH (ref 0.0–100.0)

## 2020-01-14 LAB — TYPE AND SCREEN
ABO/RH(D): O POS
Antibody Screen: NEGATIVE

## 2020-01-14 LAB — GLUCOSE, CAPILLARY: Glucose-Capillary: 211 mg/dL — ABNORMAL HIGH (ref 70–99)

## 2020-01-14 LAB — BLOOD GAS, ARTERIAL
Acid-base deficit: 0.2 mmol/L (ref 0.0–2.0)
Bicarbonate: 24.3 mmol/L (ref 20.0–28.0)
FIO2: 21
O2 Saturation: 97.1 %
Patient temperature: 37
pCO2 arterial: 42.4 mmHg (ref 32.0–48.0)
pH, Arterial: 7.377 (ref 7.350–7.450)
pO2, Arterial: 102 mmHg (ref 83.0–108.0)

## 2020-01-14 LAB — HEMOGLOBIN A1C
Hgb A1c MFr Bld: 7.1 % — ABNORMAL HIGH (ref 4.8–5.6)
Mean Plasma Glucose: 157.07 mg/dL

## 2020-01-14 LAB — SURGICAL PCR SCREEN
MRSA, PCR: NEGATIVE
Staphylococcus aureus: NEGATIVE

## 2020-01-14 LAB — APTT: aPTT: 29 seconds (ref 24–36)

## 2020-01-14 NOTE — Progress Notes (Signed)
PCP - [name]  Cardiologist - denies (pt does have appt with Dr. [name] in January 2022 per pt daughter)    Chest x-ray - 01/14/20 EKG - 01/11/20 Stress Test - denies ECHO - 11/28/19 Cardiac Cath - 12/03/19   Fasting Blood Sugar - unknown Checks Blood Sugar does not check due to visual deficit but does have CBG meter at home (lives alone) - usually gets checked at dialysis times a day. Per daughter, his sugars have been elevated lately (200s) d/t recent teeth removal surgery, pt is only able to tolerate soft foods so he has been eating more carbs than he usually does causing his sugars to run higher than usual.   Blood Thinner Instructions: n/a Aspirin Instructions: n/a   COVID TEST- 01/16/20  Coronavirus Screening  Have you experienced the following symptoms:  Cough yes/no: No Fever (>100.3F)  yes/no: No Runny nose yes/no: No Sore throat yes/no: No Difficulty breathing/shortness of breath  yes/no: No  Have you or a family member traveled in the last 14 days and where? yes/no: No   If the patient indicates "YES" to the above questions, their PAT will be rescheduled to limit the exposure to others and, the surgeon will be notified. THE PATIENT WILL NEED TO BE ASYMPTOMATIC FOR 14 DAYS.   If the patient is not experiencing any of these symptoms, the PAT nurse will instruct them to NOT bring anyone with them to their appointment since they may have these symptoms or traveled as well.   Please remind your patients and families that hospital visitation restrictions are in effect and the importance of the restrictions.     Anesthesia review: yes   Patient denies shortness of breath, fever, cough and chest pain at PAT appointment   All instructions explained to the patient, with a verbal understanding of the material. Patient agrees to go over the instructions while at home for a better understanding. Patient also instructed to self quarantine after being tested for  COVID-19. The opportunity to ask questions was provided.

## 2020-01-15 ENCOUNTER — Ambulatory Visit: Payer: Medicare Other | Attending: Internal Medicine

## 2020-01-15 DIAGNOSIS — Z23 Encounter for immunization: Secondary | ICD-10-CM

## 2020-01-15 NOTE — Progress Notes (Signed)
   Covid-19 Vaccination Clinic  Name:  LINC RENNE    MRN: [id number] DOB: 08-10-1940  01/15/2020  Mr. Swoyer was observed post Covid-19 immunization for 15 minutes 15 minuteswithout incident. He was provided with Vaccine Information Sheet and instruction to access the V-Safe system.   Mr. Dollard was instructed to call 911 with any severe reactions post vaccine: [name] Difficulty breathing  . Swelling of face and throat  . A fast heartbeat  . A bad rash all over body  . Dizziness and weakness

## 2020-01-16 ENCOUNTER — Other Ambulatory Visit (HOSPITAL_COMMUNITY)
Admission: RE | Admit: 2020-01-16 | Discharge: 2020-01-16 | Disposition: A | Discharge status: Home / Self Care | Payer: Medicare Other | Source: Ambulatory Visit | Attending: Cardiovascular Disease | Admitting: Cardiovascular Disease

## 2020-01-16 DIAGNOSIS — N400 Enlarged prostate without lower urinary tract symptoms: Secondary | ICD-10-CM | POA: Diagnosis present

## 2020-01-16 DIAGNOSIS — Z992 Dependence on renal dialysis: Secondary | ICD-10-CM | POA: Diagnosis not present

## 2020-01-16 DIAGNOSIS — D509 Iron deficiency anemia, unspecified: Secondary | ICD-10-CM | POA: Diagnosis not present

## 2020-01-16 DIAGNOSIS — Z006 Encounter for examination for normal comparison and control in clinical research program: Secondary | ICD-10-CM | POA: Diagnosis not present

## 2020-01-16 DIAGNOSIS — Z01812 Encounter for preprocedural laboratory examination: Secondary | ICD-10-CM | POA: Insufficient documentation

## 2020-01-16 DIAGNOSIS — Z952 Presence of prosthetic heart valve: Secondary | ICD-10-CM | POA: Diagnosis not present

## 2020-01-16 DIAGNOSIS — E7800 Pure hypercholesterolemia, unspecified: Secondary | ICD-10-CM | POA: Diagnosis not present

## 2020-01-16 DIAGNOSIS — I5033 Acute on chronic diastolic (congestive) heart failure: Secondary | ICD-10-CM | POA: Diagnosis not present

## 2020-01-16 DIAGNOSIS — K219 Gastro-esophageal reflux disease without esophagitis: Secondary | ICD-10-CM | POA: Diagnosis not present

## 2020-01-16 DIAGNOSIS — Z8546 Personal history of malignant neoplasm of prostate: Secondary | ICD-10-CM | POA: Diagnosis not present

## 2020-01-16 DIAGNOSIS — Z20822 Contact with and (suspected) exposure to covid-19: Secondary | ICD-10-CM | POA: Insufficient documentation

## 2020-01-16 DIAGNOSIS — E1122 Type 2 diabetes mellitus with diabetic chronic kidney disease: Secondary | ICD-10-CM | POA: Diagnosis not present

## 2020-01-16 DIAGNOSIS — E781 Pure hyperglyceridemia: Secondary | ICD-10-CM | POA: Diagnosis not present

## 2020-01-16 DIAGNOSIS — Z79899 Other long term (current) drug therapy: Secondary | ICD-10-CM | POA: Diagnosis not present

## 2020-01-16 DIAGNOSIS — N186 End stage renal disease: Secondary | ICD-10-CM | POA: Diagnosis not present

## 2020-01-16 DIAGNOSIS — H548 Legal blindness, as defined in USA: Secondary | ICD-10-CM | POA: Diagnosis not present

## 2020-01-16 DIAGNOSIS — I350 Nonrheumatic aortic (valve) stenosis: Secondary | ICD-10-CM | POA: Diagnosis not present

## 2020-01-16 DIAGNOSIS — E785 Hyperlipidemia, unspecified: Secondary | ICD-10-CM | POA: Diagnosis not present

## 2020-01-16 DIAGNOSIS — R52 Pain, unspecified: Secondary | ICD-10-CM | POA: Diagnosis not present

## 2020-01-16 DIAGNOSIS — N2581 Secondary hyperparathyroidism of renal origin: Secondary | ICD-10-CM | POA: Diagnosis not present

## 2020-01-16 DIAGNOSIS — I4510 Unspecified right bundle-branch block: Secondary | ICD-10-CM | POA: Diagnosis not present

## 2020-01-16 DIAGNOSIS — I132 Hypertensive heart and chronic kidney disease with heart failure and with stage 5 chronic kidney disease, or end stage renal disease: Secondary | ICD-10-CM | POA: Diagnosis not present

## 2020-01-16 DIAGNOSIS — D631 Anemia in chronic kidney disease: Secondary | ICD-10-CM | POA: Diagnosis not present

## 2020-01-16 DIAGNOSIS — Z794 Long term (current) use of insulin: Secondary | ICD-10-CM | POA: Diagnosis not present

## 2020-01-16 DIAGNOSIS — Z66 Do not resuscitate: Secondary | ICD-10-CM | POA: Diagnosis not present

## 2020-01-16 LAB — SARS CORONAVIRUS 2 (TAT 6-24 HRS): SARS Coronavirus 2: NEGATIVE

## 2020-01-18 DIAGNOSIS — Z992 Dependence on renal dialysis: Secondary | ICD-10-CM | POA: Diagnosis not present

## 2020-01-18 DIAGNOSIS — D631 Anemia in chronic kidney disease: Secondary | ICD-10-CM | POA: Diagnosis not present

## 2020-01-18 DIAGNOSIS — R52 Pain, unspecified: Secondary | ICD-10-CM | POA: Diagnosis not present

## 2020-01-18 DIAGNOSIS — N2581 Secondary hyperparathyroidism of renal origin: Secondary | ICD-10-CM | POA: Diagnosis not present

## 2020-01-18 DIAGNOSIS — D509 Iron deficiency anemia, unspecified: Secondary | ICD-10-CM | POA: Diagnosis not present

## 2020-01-18 DIAGNOSIS — N186 End stage renal disease: Secondary | ICD-10-CM | POA: Diagnosis not present

## 2020-01-18 MED ORDER — DEXMEDETOMIDINE HCL IN NACL 400 MCG/100ML IV SOLN
0.1000 ug/kg/h | INTRAVENOUS | Status: AC
  Administered 2020-01-19: .5 ug/kg/h via INTRAVENOUS
  Filled 2020-01-18 (×2): qty 100

## 2020-01-18 MED ORDER — SODIUM CHLORIDE 0.9 % IV SOLN
INTRAVENOUS | Status: DC
  Filled 2020-01-18 (×2): qty 30

## 2020-01-18 MED ORDER — POTASSIUM CHLORIDE 2 MEQ/ML IV SOLN
80.0000 meq | INTRAVENOUS | Status: DC
  Filled 2020-01-18 (×2): qty 40

## 2020-01-18 MED ORDER — MAGNESIUM SULFATE 50 % IJ SOLN
40.0000 meq | INTRAMUSCULAR | Status: DC
  Filled 2020-01-18 (×2): qty 9.85

## 2020-01-18 MED ORDER — VANCOMYCIN HCL 1250 MG/250ML IV SOLN
1250.0000 mg | INTRAVENOUS | Status: AC
  Administered 2020-01-19: 1250 mg via INTRAVENOUS
  Filled 2020-01-18 (×2): qty 250

## 2020-01-18 MED ORDER — NOREPINEPHRINE 4 MG/250ML-% IV SOLN
0.0000 ug/min | INTRAVENOUS | Status: AC
  Administered 2020-01-19: 2 ug/min via INTRAVENOUS
  Filled 2020-01-18: qty 250

## 2020-01-18 MED ORDER — SODIUM CHLORIDE 0.9 % IV SOLN
1.5000 g | INTRAVENOUS | Status: AC
  Administered 2020-01-19: 1.5 g via INTRAVENOUS
  Filled 2020-01-18 (×2): qty 1.5

## 2020-01-18 NOTE — H&P (Signed)
[address]       [place] [address]             [phone]      Cardiothoracic Surgery Admission History and Physical   Referring Provider is O'Neal, Karlton Maya, *  Primary Cardiologist is No primary care provider on file.  PCP is [name], MD          Chief Complaint    Patient presents with    .   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  He was not seen by cardiology.  He was readmitted on 11/26/2019 after he presented via EMS with chest and epigastric pain as well as shortness of breath while going to hemodialysis. HS troponin was 36--> 37.  Electrocardiogram showed right bundle branch block with no acute changes.  A chest x-ray showed pulmonary vascular congestion.  He had a CTA of the chest, abdomen, and pelvis that was negative for PE and aortic dissection.  There was a 2.3 cm enhancing mass within the lumen of the mid transverse colon concerning for neoplasm as well as a distended gallbladder with numerous calcified gallstones and a 2.7 cm indeterminate left adrenal mass.  He was noted to have a murmur on exam and cardiology consultation was obtained.  He underwent colonoscopy which showed a polyp without evidence of malignancy.  2D echocardiogram showed moderate to severe aortic stenosis with a peak velocity of 3.7 m/s and a mean gradient of 26 mmHg.  Aortic valve area was 1 cm with a dimensionless index of 0.31.  Left ventricular systolic function was normal.  His murmur was felt to be consistent with severe aortic stenosis.  His gated cardiac CTA showed a calcium score of 2336 with a trileaflet aortic valve that was severely calcified and thickened.  Leaflets had  severely restricted mobility.  This was felt to be consistent with severe aortic stenosis.  He underwent cardiac catheterization on 12/03/2019 which showed mild nonobstructive coronary disease.  The mean gradient across aortic valve was 26.8 mmHg.     The patient is here today with his daughter.  He has been a widower for 14 years and lives by himself in an apartment in [place].  He has 2 children with his daughter living locally.  His daughter and his knees help him get things from the store but he takes care of himself independently at home.  He does use a walker for assistance.  He was noted to have poor dentition when evaluated by cardiology and underwent dental evaluation.  He has been scheduled for dental extractions on Wednesday, 12/30/2019.  He says that he has been doing fairly well at home.  He does have some exertional shortness of breath and fatigue.  He denies any chest pain or pressure.  He has had no peripheral edema.  He denies dizziness and syncope.          Past Medical History:    Diagnosis   Date    .   Anemia            low iron    .   Arthritis        .   Cancer [facility])   ?2006  prostate    .   Chronic kidney disease            Stage 4    .   Colon polyps   ~ 1993 and 2003        Dr [name], [facility].  08/2001 colonoscopy: tubular adenoma at cecum.      .   Diabetes mellitus without complication ([place])        .   Elevated cholesterol with high triglycerides        .   GERD (gastroesophageal reflux disease)        .   Hypertension        .   Legally blind in left eye, as defined in [place]            has pinpoint vision in right eye                Past Surgical History:    Procedure   Laterality   Date    .   AV FISTULA PLACEMENT   Left   03/20/2018        Procedure: ARTERIOVENOUS (AV) FISTULA CREATION ARM;  Surgeon: [name], MD;   Location: [place];  Service: Vascular;  Laterality: Left;    .   BASCILIC VEIN TRANSPOSITION   Left   05/21/2018        Procedure: LEFT BASILIC VEIN FISTULA SECOND STAGE;  Surgeon: [name], MD;  Location: [place];  Service: Vascular;  Laterality: Left;    .   COLONOSCOPY            .   COLONOSCOPY WITH PROPOFOL   N/A   11/30/2019        Procedure: COLONOSCOPY WITH PROPOFOL;  Surgeon: Ronnette Juniper, MD;  Location: [place];  Service: Gastroenterology;  Laterality: N/A;    .   GLAUCOMA SURGERY       2019        multiple surgeries    .   HEMOSTASIS CLIP PLACEMENT       11/30/2019        Procedure: HEMOSTASIS CLIP PLACEMENT;  Surgeon: Ronnette Juniper, MD;  Location: [place];  Service: Gastroenterology;;    .   KNEE ARTHROSCOPY            .   POLYPECTOMY       11/30/2019        Procedure: POLYPECTOMY;  Surgeon: Ronnette Juniper, MD;  Location: [place];  Service: Gastroenterology;;    .   PROSTATE SURGERY            .   RIGHT/LEFT HEART CATH AND CORONARY ANGIOGRAPHY   N/A   12/03/2019        Procedure: RIGHT/LEFT HEART CATH AND CORONARY ANGIOGRAPHY;  Surgeon: Burnell Blanks, MD;  Location: [place] [facility];  Service: Cardiovascular;  Laterality: N/A;    .   SUBMUCOSAL TATTOO INJECTION       11/30/2019        Procedure: SUBMUCOSAL TATTOO INJECTION;  Surgeon: Ronnette Juniper, MD;  Location: [facility];  Service: Gastroenterology;;                Family History    Problem   Relation   Age of Onset    .   CAD   Mother               Social History  Socioeconomic History    .   Marital status:   Widowed            Spouse name:   Not on file    .   Number of children:   Not on file    .   Years of education:   Not on file    .   Highest education level:   Not on file    Occupational History     .   Occupation:   retired    Tobacco Use    .   Smoking status:   Former Smoker            Packs/day:   0.50            Quit date:   03/23/2018            Years since quitting:   1.7    .   Smokeless tobacco:   Never Used    Vaping Use    .   Vaping Use:   Never used    Substance and Sexual Activity    .   Alcohol use:   No    .   Drug use:   No    .   Sexual activity:   Not Currently    Other Topics   Concern    .   Not on file    Social History Narrative    .   Not on file        Social Determinants of Health           Financial Resource Strain: Low Risk     .   Difficulty of Paying Living Expenses: Not hard at all    Food Insecurity: No Food Insecurity    .   Worried About Charity fundraiser in the Last Year: Never true    .   Ran Out of Food in the Last Year: Never true    Transportation Needs: No Transportation Needs    .   Lack of Transportation (Medical): No    .   Lack of Transportation (Non-Medical): No    Physical Activity: Inactive    .   Days of Exercise per Week: 0 days    .   Minutes of Exercise per Session: 0 min    Stress: No Stress Concern Present    .   Feeling of Stress : Not at all    Social Connections:     .   Frequency of Communication with Friends and Family: Not on file    .   Frequency of Social Gatherings with Friends and Family: Not on file    .   Attends Religious Services: Not on file    .   Active Member of Clubs or Organizations: Not on file    .   Attends Archivist Meetings: Not on file    .   Marital Status: Not on file    Intimate Partner Violence:     .   Fear of Current or Ex-Partner: Not on file    .   Emotionally Abused: Not on file    .   Physically Abused: Not on file    .   Sexually Abused: Not on file                 Current  Outpatient Medications    Medication   Sig   Dispense  Refill    .   acetaminophen (TYLENOL) 500 MG tablet   Take 2 tablets (1,000 mg total) by mouth every 6 (six) hours as needed for mild pain, moderate pain or headache.   60 tablet   0    .   apraclonidine (IOPIDINE) 0.5 % ophthalmic solution   Place 1 drop into the right eye 3 (three) times daily.             [name]   atorvastatin (LIPITOR) 10 MG tablet   TAKE 1 TABLET BY MOUTH EVERY DAY (Patient taking differently: Take 10 mg by mouth daily. )   90 tablet   0    .   B Complex-C-Folic Acid (DIALYVITE 407) 0.8 MG TABS   Take 1 tablet by mouth daily.            .   BD PEN NEEDLE NANO U/F 32G X 4 MM MISC   USE SUBCUTANEOUS FOUR TIMES A DAY            .   blood glucose meter kit and supplies KIT   Dispense based on patient and insurance preference. Use up to four times daily as directed. (FOR ICD-9 250.00, 250.01). For QAC - HS accuchecks.   1 each   1    .   carvedilol (COREG) 3.125 MG tablet   TAKE 1 TABLET(3.125 MG) BY MOUTH TWICE DAILY WITH A MEAL (Patient taking differently: Take 3.125 mg by mouth 2 (two) times daily with a meal. )   180 tablet   1    .   diclofenac Sodium (VOLTAREN) 1 % GEL   Apply 4 g topically 4 (four) times daily as needed. (Patient taking differently: Apply 4 g topically 4 (four) times daily as needed (pain). )   100 g   0    .   dorzolamide-timolol (COSOPT) 22.3-6.8 MG/ML ophthalmic solution   Place 1 drop into the right eye 2 (two) times daily.       11    .   esomeprazole (NEXIUM) 40 MG capsule   TAKE 1 CAPSULE BY MOUTH EVERY DAY (Patient taking differently: Take 40 mg by mouth every other day. )   90 capsule   1    .   ferric citrate (AURYXIA) 1 GM 210 MG(Fe) tablet   Take 420 mg by mouth 3 (three) times daily with meals.            .   gabapentin (NEURONTIN) 100 MG capsule   TAKE 1 CAPSULE(100 MG) BY  MOUTH AT BEDTIME (Patient taking differently: Take 100 mg by mouth at bedtime. )   90 capsule   1    .   hydrALAZINE (APRESOLINE) 25 MG tablet   TAKE 1 TABLET(25 MG) BY MOUTH TWICE DAILY (Patient taking differently: Take 25 mg by mouth in the morning and at bedtime. )   180 tablet   1    .   hydroxypropyl methylcellulose / hypromellose (ISOPTO TEARS / GONIOVISC) 2.5 % ophthalmic solution   Place 1 drop into the left eye as needed for dry eyes.            .   isosorbide mononitrate (IMDUR) 30 MG 24 hr tablet   TAKE 1 TABLET(30 MG) BY MOUTH DAILY (Patient taking differently: Take 30 mg by mouth daily. )   90 tablet   1    .   Lancets (ONETOUCH DELICA PLUS [id number]) MISC   USE AS DIRECTED  UP TO FOUR TIMES A DAY            .   latanoprost (XALATAN) 0.005 % ophthalmic solution   Place 1 drop into the right eye at bedtime.            [name] test strip   USE AS DIRECTED TO TEST FOUR TIMES A DAY   100 each   3    .   pilocarpine (PILOCAR) 4 % ophthalmic solution   Place 1 drop into the right eye 4 (four) times daily.             .   tamsulosin (FLOMAX) 0.4 MG CAPS capsule   Take 1 capsule (0.4 mg total) by mouth at bedtime. (Patient not taking: Reported on 12/23/2019)   30 capsule   0        No current facility-administered medications for this visit.          No Known Allergies           Review of Systems:                 General:                      normal appetite, + decreased energy, no weight gain, + weight loss, no fever              Cardiac:                       no chest pain with exertion, no chest pain at rest, +SOB with  exertion, + resting SOB, no PND, no orthopnea, no palpitations, no arrhythmia, no atrial fibrillation, no LE edema, no dizzy spells, no syncope              Respiratory:                 + shortness of breath, no home oxygen, no productive cough, no dry cough, no  bronchitis, no wheezing, no hemoptysis, no asthma, no pain with inspiration or cough, no sleep apnea, no CPAP at night              GI:                               no difficulty swallowing, + reflux, + frequent heartburn, no hiatal hernia, no abdominal pain, no constipation, no diarrhea, no hematochezia, no hematemesis, no melena              GU:                              no dysuria,  no frequency, no urinary tract infection, no hematuria, no enlarged prostate, no kidney stones, + chronic kidney disease              Vascular:                     no pain suggestive of claudication, no pain in feet, no leg cramps, no varicose veins, no DVT, no non-healing foot ulcer              Neuro:                         no stroke, no TIA's,  no seizures, no headaches, no temporary blindness one eye,  no slurred speech, + peripheral neuropathy, no chronic pain, no instability of gait, no memory/cognitive dysfunction              Musculoskeletal:         + arthritis, + joint swelling, no myalgias, no difficulty walking, normal mobility but uses walker for balance due to blindness.              Skin:                            no rash, no itching, no skin infections, no pressure sores or ulcerations              Psych:                         no anxiety, no depression, no nervousness, no unusual recent stress              Eyes:                           no blurry vision, no floaters, no recent vision changes, does not wear glasses or contacts              ENT:                            no hearing loss, + loose or painful teeth, no dentures, last saw dentist 12/14/19, Dr. Sandi Mariscal. Scheduled for extractions.              Hematologic:               no easy bruising, no abnormal bleeding, no clotting disorder, no frequent epistaxis              Endocrine:                   + diabetes, does not check CBG's at home                                    Physical Exam:                 BP 127/80    Pulse 76   Temp 97.6 F (36.4 C) (Skin)   Resp 20   Ht _0  (1.549 m)   Wt 167 lb (75.8 kg)   SpO2 98% Comment: RA  BMI 31.55 kg/m               General:                      Elderly,  well-appearing              HEENT:                       Multiple broken off teeth.              Neck:                           no JVD, no bruits, no adenopathy  Chest:                          clear to auscultation, symmetrical breath sounds, no wheezes, no rhonchi               CV:                              RRR, grade lll/VI crescendo/decrescendo murmur heard best at RSB,  no diastolic murmur              Abdomen:                    soft, non-tender, no masses               Extremities:                 warm, well-perfused, pulses palpable at ankle, no LE edema, left upper arm AV fistula with a palpable thrill.              Rectal/GU                   Deferred              Neuro:                         Grossly non-focal and symmetrical throughout              Skin:                            Clean and dry, no rashes, no breakdown        Diagnostic Tests:     ECHOCARDIOGRAM REPORT         Patient Name:   HJALMAR BALLENGEE Date of Exam: 11/28/2019  Medical Rec #:  [id number]       Height:       60.0 in  Accession #:    [id number]      Weight:       162.5 lb  Date of Birth:  29-Dec-1940       BSA:          1.709 m  Patient Age:    38 years        BP:           155/71 mmHg  Patient Gender: M               HR:           76 bpm.  Exam Location:  Inpatient   Procedure: 2D Echo   Indications:    chest pain 786.50     History:        Patient has prior history of Echocardiogram examinations,  most                  recent 09/02/2017. End stage renal disease, Aortic Valve  Disease;                  Risk Factors:Diabetes and Dyslipidemia.     Sonographer:    Johny Chess  Referring Phys: [id number] Mungo TAYLOR      Sonographer Comments: Image acquisition challenging  due to respiratory  motion.  IMPRESSIONS     1. Left ventricular ejection fraction, by estimation, is 65 to 70%.  The  left ventricle has normal function. The left ventricle has no regional  wall motion abnormalities. Left ventricular diastolic parameters are  indeterminate.   2. Right ventricular systolic function is normal. The right ventricular  size is normal. Tricuspid regurgitation signal is inadequate for assessing  PA pressure.   3. The mitral valve is degenerative. Trivial mitral valve regurgitation.  No evidence of mitral stenosis. Severe mitral annular calcification.   4. The aortic valve is calcified. There is severe calcifcation of the  aortic valve. There is severe thickening of the aortic valve. Aortic valve  regurgitation is trivial. 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   5. The inferior vena cava is normal in size with greater than 50%  respiratory variability, suggesting right atrial pressure of 3 mmHg.   FINDINGS   Left Ventricle: Left ventricular ejection fraction, by estimation, is 65  to 70%. The left ventricle has normal function. The left ventricle has no  regional wall motion abnormalities. The left ventricular internal cavity  size was normal in size. There is   no left ventricular hypertrophy. Left ventricular diastolic parameters  are indeterminate.   Right Ventricle: The right ventricular size is normal. No increase in  right ventricular wall thickness. Right ventricular systolic function is  normal. Tricuspid regurgitation signal is inadequate for assessing PA  pressure.   Left Atrium: Left atrial size was normal in size.   Right Atrium: Right atrial size was normal in size.   Pericardium: There is no evidence of pericardial effusion.   Mitral Valve: The mitral valve is degenerative in appearance. Severe  mitral annular calcification. Trivial mitral valve regurgitation. No  evidence of mitral valve stenosis.   Tricuspid Valve: The tricuspid  valve is grossly normal. Tricuspid valve  regurgitation is not demonstrated.   Aortic Valve: The aortic valve is calcified. There is severe calcifcation  of the aortic valve. There is severe thickening of the aortic valve.  Aortic valve regurgitation is trivial. Moderate to severe aortic stenosis  is present. Aortic valve mean  gradient measures 26.0 mmHg. Aortic valve peak gradient measures 54.8  mmHg. Aortic valve area, by VTI measures 1.06 cm.   Pulmonic Valve: The pulmonic valve was not well visualized. Pulmonic valve  regurgitation is not visualized.   Aorta: The aortic root is normal in size and structure.   Venous: The inferior vena cava is normal in size with greater than 50%  respiratory variability, suggesting right atrial pressure of 3 mmHg.   IAS/Shunts: No atrial level shunt detected by color flow Doppler.      LEFT VENTRICLE  PLAX 2D  LVIDd:         3.90 cm  Diastology  LVIDs:         2.35 cm  LV e' medial:    4.57 cm/s  LV PW:         1.00 cm  LV E/e' medial:  15.5  LV IVS:        0.90 cm  LV e' lateral:   8.05 cm/s  LVOT diam:     2.08 cm  LV E/e' lateral: 8.8  LV SV:         76  LV SV Index:   44  LVOT Area:     3.40 cm      RIGHT VENTRICLE             IVC  RV S prime:     11.00 cm/s  IVC diam: 1.25 cm  TAPSE (  M-mode): 2.0 cm   LEFT ATRIUM             Index       RIGHT ATRIUM          Index  LA diam:        3.40 cm 1.99 cm/m  RA Area:     9.48 cm  LA Vol (A2C):   53.7 ml 31.42 ml/m RA Volume:   18.10 ml 10.59 ml/m  LA Vol (A4C):   30.6 ml 17.91 ml/m  LA Biplane Vol: 41.8 ml 24.46 ml/m   AORTIC VALVE  AV Area (Vmax):    0.89 cm  AV Area (Vmean):   0.97 cm  AV Area (VTI):     1.06 cm  AV Vmax:           370.00 cm/s  AV Vmean:          231.000 cm/s  AV VTI:            0.716 m  AV Peak Grad:      54.8 mmHg  AV Mean Grad:      26.0 mmHg  LVOT Vmax:         96.71 cm/s  LVOT Vmean:        66.068 cm/s  LVOT VTI:          0.223 m  LVOT/AV VTI  ratio: 0.31     AORTA  Ao Root diam: 3.30 cm   MITRAL VALVE  MV Area (PHT): 1.94 cm     SHUNTS  MV Decel Time: 391 msec     Systemic VTI:  0.22 m  MV E velocity: 71.00 cm/s   Systemic Diam: 2.08 cm  MV A velocity: 140.00 cm/s  MV E/A ratio:  0.51   Mihai Croitoru MD  Electronically signed by Sanda Klein MD  Signature Date/Time: 11/29/2019/8:09:10 AM         Physicians        Panel Physicians    Referring Physician    Case Authorizing Physician     Burnell Blanks, MD (Primary)            Procedures       RIGHT/LEFT HEART CATH AND CORONARY ANGIOGRAPHY    Conclusion        .Prox RCA to Mid RCA lesion is 20% stenosed.   .Dist RCA lesion is 20% stenosed.   Marland KitchenRPAV lesion is 40% stenosed.   .Mid Cx lesion is 20% stenosed.   .Prox LAD lesion is 30% stenosed.      1. Mild non-obstructive CAD  2. Severe aortic stenosis by echo (cath mean gradient 26.8 mmHg0     Recommendations: will continue workup for TAVR. He has had his scans. He can likely be discharged home later today and we will complete his TAVR workup as an outpatient.      Recommendations       Antiplatelet/Anticoag   Will continue workup for TAVR.    Surgeon Notes               11/30/2019  3:42 PM GI Operative Note revision by Ronnette Juniper, MD            11/30/2019  9:05 AM Brief Op Note signed by Ronnette Juniper, MD    Indications       Severe aortic stenosis [I35.0 (ICD-10-CM)]    Procedural Details       Technical Details   Indication: Severe aortic stenosis  Procedure: The  risks, benefits, complications, treatment options, and expected outcomes were discussed with the patient. The patient and/or family concurred with the proposed plan, giving informed consent. The patient was brought to the cath lab after IV hydration was given. The patient was not sedated.  The right groin was prepped and draped in the usual manner. Using  the modified Seldinger access technique, a 5 French sheath was placed in the right femoral artery and a 7 French sheath was placed in the right femoral vein using u/s guidance. Right heart catheterization performed with a Swan-Ganz catheter. Standard diagnostic catheters were used to perform selective coronary angiography. I crossed the aortic valve with an AL-1 catheter and a straight wire. Mynx closure device placed in the right femoral artery.   There were no immediate complications. The patient was taken to the recovery area in stable condition.   Estimated blood loss <50 mL.   During this procedure no sedation was administered.    Medications  (Filter: Administrations occurring from 0730 to 0822 on 12/03/19)     (important)  Continuous medications are totaled by the amount administered until 12/03/19 0822.    lidocaine (PF) (XYLOCAINE) 1 % injection (mL)  Total volume:  15 mL      Date/Time       Rate/Dose/Volume   Action    12/03/19 0748       15 mL   Given       Heparin (Porcine) in NaCl 1000-0.9 UT/500ML-% SOLN (mL)  Total volume:  1,000 mL      Date/Time       Rate/Dose/Volume   Action    12/03/19 0815       500 mL   Given    0815       500 mL   Given       iohexol (OMNIPAQUE) 350 MG/ML injection (mL)  Total volume:  50 mL      Date/Time       Rate/Dose/Volume   Action    12/03/19 0819       50 mL   Given       apraclonidine (IOPIDINE) 0.5 % ophthalmic solution 1 drop (drop)  Total dose:  Cannot be calculated*  *Administration dose not documented      Date/Time       Rate/Dose/Volume   Action    12/03/19 0730       *Not included in total   MAR Hold       atorvastatin (LIPITOR) tablet 10 mg (mg)  Total dose:  Cannot be calculated*  *Administration dose not documented      Date/Time       Rate/Dose/Volume   Action    12/03/19 0730       *Not  included in total   MAR Hold       carvedilol (COREG) tablet 3.125 mg (mg)  Total dose:  Cannot be calculated*  *Administration dose not documented      Date/Time       Rate/Dose/Volume   Action    12/03/19 0730       *Not included in total   MAR Hold       Chlorhexidine Gluconate Cloth 2 % PADS 6 each (each)  Total dose:  Cannot be calculated* Dosing weight:  73.5  *Administration dose not documented      Date/Time       Rate/Dose/Volume   Action    12/03/19 0730       *Not included in  total   MAR Hold    0800       *Not included in total   Automatically Held       diclofenac Sodium (VOLTAREN) 1 % topical gel 4 g (g)  Total dose:  Cannot be calculated* Dosing weight:  74.2  *Administration dose not documented      Date/Time       Rate/Dose/Volume   Action    12/03/19 0730       *Not included in total   MAR Hold       dorzolamide-timolol (COSOPT) 22.3-6.8 MG/ML ophthalmic solution 1 drop (drop)  Total dose:  Cannot be calculated* Dosing weight:  74.2  *Administration dose not documented      Date/Time       Rate/Dose/Volume   Action    12/03/19 0730       *Not included in total   MAR Hold       doxercalciferol (HECTOROL) injection 7 mcg (mcg)  Total dose:  Cannot be calculated* Dosing weight:  75.5  *Administration dose not documented      Date/Time       Rate/Dose/Volume   Action    12/03/19 0730       *Not included in total   MAR Hold       ferric citrate (AURYXIA) tablet 420 mg (mg)  Total dose:  Cannot be calculated*  *Administration dose not documented      Date/Time       Rate/Dose/Volume   Action    12/03/19 0730       *Not included in total   MAR Hold       gabapentin (NEURONTIN) capsule 100 mg (mg)  Total dose:  Cannot be calculated*  *Administration dose not documented      Date/Time        Rate/Dose/Volume   Action    12/03/19 0730       *Not included in total   MAR Hold       heparin injection 5,000 Units (Units)  Total dose:  Cannot be calculated* Dosing weight:  78  *Administration dose not documented      Date/Time       Rate/Dose/Volume   Action    12/03/19 0730       *Not included in total   MAR Hold       hydrALAZINE (APRESOLINE) injection 10 mg (mg)  Total dose:  Cannot be calculated* Dosing weight:  78  *Administration dose not documented      Date/Time       Rate/Dose/Volume   Action    12/03/19 0730       *Not included in total   MAR Hold       hydrALAZINE (APRESOLINE) tablet 25 mg (mg)  Total dose:  Cannot be calculated*  *Administration dose not documented      Date/Time       Rate/Dose/Volume   Action    12/03/19 0730       *Not included in total   MAR Hold       hydrocortisone cream 1 % 1 application (application)  Total dose:  Cannot be calculated* Dosing weight:  76.2  *Administration dose not documented      Date/Time       Rate/Dose/Volume   Action    12/03/19 0730       *Not included in total   MAR Hold       HYDROmorphone (DILAUDID) injection 0.5-1 mg (mg)  Total dose:  Cannot be calculated* Dosing weight:  78  *Administration dose not documented      Date/Time       Rate/Dose/Volume   Action    12/03/19 0730       *Not included in total   MAR Hold       hydroxypropyl methylcellulose / hypromellose (ISOPTO TEARS / GONIOVISC) 2.5 % ophthalmic solution 1 drop (drop)  Total dose:  Cannot be calculated* Dosing weight:  74.2  *Administration dose not documented      Date/Time       Rate/Dose/Volume   Action    12/03/19 0730       *Not included in total   MAR Hold       insulin aspart (novoLOG) injection 0-5 Units (Units)  Total dose:  Cannot be calculated* Dosing weight:   78  *Administration dose not documented      Date/Time       Rate/Dose/Volume   Action    12/03/19 0730       *Not included in total   MAR Hold       insulin aspart (novoLOG) injection 0-6 Units (Units)  Total dose:  Cannot be calculated* Dosing weight:  75.5  *Administration dose not documented      Date/Time       Rate/Dose/Volume   Action    12/03/19 0730       *Not included in total   MAR Hold       isosorbide mononitrate (IMDUR) 24 hr tablet 30 mg (mg)  Total dose:  Cannot be calculated*  *Administration dose not documented      Date/Time       Rate/Dose/Volume   Action    12/03/19 0730       *Not included in total   MAR Hold       latanoprost (XALATAN) 0.005 % ophthalmic solution 1 drop (drop)  Total dose:  Cannot be calculated* Dosing weight:  75.5  *Administration dose not documented      Date/Time       Rate/Dose/Volume   Action    12/03/19 0730       *Not included in total   MAR Hold       multivitamin (RENA-VIT) tablet 1 tablet (tablet)  Total dose:  Cannot be calculated* Dosing weight:  73.5  *Administration dose not documented      Date/Time       Rate/Dose/Volume   Action    12/03/19 0730       *Not included in total   MAR Hold       pantoprazole (PROTONIX) EC tablet 40 mg (mg)  Total dose:  Cannot be calculated* Dosing weight:  78  *Administration dose not documented      Date/Time       Rate/Dose/Volume   Action    12/03/19 0730       *Not included in total   MAR Hold       pilocarpine (PILOCAR) 4 % ophthalmic solution 1 drop (drop)  Total dose:  Cannot be calculated* Dosing weight:  75.5  *Administration dose not documented      Date/Time       Rate/Dose/Volume   Action    12/03/19 0730       *Not included in total   MAR Hold       sodium chloride flush (NS) 0.9 % injection 3 mL (mL)  Total  dose:  Cannot be calculated* Dosing weight:  78.8  *  Administration dose not documented      Date/Time       Rate/Dose/Volume   Action    12/03/19 0730       *Not included in total   MAR Hold       tamsulosin (FLOMAX) capsule 0.4 mg (mg)  Total dose:  Cannot be calculated* Dosing weight:  73.5  *Administration dose not documented      Date/Time       Rate/Dose/Volume   Action    12/03/19 0730       *Not included in total   MAR Hold       Contrast         Medication Name   Total Dose    iohexol (OMNIPAQUE) 350 MG/ML injection   50 mL       Radiation/Fluoro       Fluoro time: 7.4 (min)  DAP: 18734 (mGycm2)  Cumulative Air Kerma: 902.4 (mGy)    Complications       Complications documented before study signed (12/03/2019  8:33 AM)       RIGHT/LEFT HEART CATH AND CORONARY ANGIOGRAPHY       None   Documented by Burnell Blanks, MD 12/03/2019  8:25 AM    Date Found: 12/03/2019    Time Range: Intraprocedure            Coronary Findings     Diagnostic  Dominance: Right    Left Anterior Descending    Vessel is large.    Prox LAD lesion is 30% stenosed.    Left Circumflex    Vessel is moderate in size.    Mid Cx lesion is 20% stenosed.    Right Coronary Artery    Vessel is large.    Prox RCA to Mid RCA lesion is 20% stenosed.    Dist RCA lesion is 20% stenosed.    Right Posterior Atrioventricular Artery    RPAV lesion is 40% stenosed.    Intervention     No interventions have been documented.  Coronary Diagrams     Diagnostic  Dominance: Right  untitled image  Intervention     Implants          Vascular Products    Closure Mynx Control 10f-- [id number] Implanted    Inventory item:   [facility]   Model/Cat number:   [id number]   Manufacturer:   [employer]  Lot number:   [id number]    Device identifier:   [id number]  Device identifier type:   GS1    Area Of Implantation:   Femoral Artery            GUDID Information       Request status   Successful            Brand name:   MYNX CONTROL   Version/Model:   [id number]   Company name:   AFayettesafety info as of 12/03/19:   MR Safe    Contains dry or latex rubber:   No            GMDN P.T. name:   Wound hydrogel dressing, non-antimicrobial            As of 12/03/2019       Status:   Implanted               Syngo Images  Show images for CARDIAC CATHETERIZATION  Images on Long Term Storage      Show images for Antione, Obar to Procedure Log       Procedure Log       Hemo Data             Most Recent Value     Fick Cardiac Output   6.95 L/min    Fick Cardiac Output Index   4.01 (L/min)/BSA    Aortic Mean Gradient   26.81 mmHg    Aortic Peak Gradient   8 mmHg    Aortic Valve Area   1.00    Aortic Value Area Index   0.58 cm2/BSA    RA A Wave   3 mmHg    RA V Wave   3 mmHg    RA Mean   1 mmHg    RV Systolic Pressure   30 mmHg    RV Diastolic Pressure   -1 mmHg    RV EDP   1 mmHg    PA Systolic Pressure   23 mmHg    PA Diastolic Pressure   7 mmHg    PA Mean   12 mmHg    PW A Wave   8 mmHg    PW V Wave   6 mmHg    PW Mean   5 mmHg    AO Systolic Pressure   518 mmHg    AO Diastolic Pressure   61 mmHg    AO Mean   94 mmHg    LV Systolic Pressure   841 mmHg    LV Diastolic Pressure   5 mmHg    LV EDP   12 mmHg    AOp Systolic Pressure   660 mmHg    AOp Diastolic Pressure   59 mmHg    AOp Mean Pressure   94 mmHg    LVp Systolic Pressure   630 mmHg    LVp Diastolic Pressure   4 mmHg    LVp EDP Pressure   12 mmHg    QP/QS   1    TPVR Index   2.99  HRUI    TSVR Index   23.41 HRUI    PVR SVR Ratio   0.08    TPVR/TSVR Ratio   0.13            ADDENDUM REPORT: 12/02/2019 13:26     EXAM:  OVER-READ INTERPRETATION  CT CHEST     The following report is an over-read performed by radiologist Dr.  Samara Snide [facility] on 12/02/2019. This over-read  does not include interpretation of cardiac or coronary anatomy or  pathology. The coronary CTA interpretation by the cardiologist is  attached.     COMPARISON:  11/26/2019 chest CT angiogram.     FINDINGS:  Please see the separate concurrent chest CT angiogram report for  details.     IMPRESSION:  Please see the separate concurrent chest CT angiogram report for  details.        Electronically Signed    By: Ilona Sorrel M.D.    On: 12/02/2019 13:26       Addended by Sharyn Blitz, MD on 12/02/2019  1:28 PM    Study Result       Addenda    ADDENDUM REPORT: 12/02/2019 13:26     EXAM:  OVER-READ INTERPRETATION  CT CHEST     The following report is an over-read performed by radiologist  Dr.  Samara Snide [facility] on 12/02/2019. This over-read  does not include interpretation of cardiac or coronary anatomy or  pathology. The coronary CTA interpretation by the cardiologist is  attached.     COMPARISON:  11/26/2019 chest CT angiogram.     FINDINGS:  Please see the separate concurrent chest CT angiogram report for  details.     IMPRESSION:  Please see the separate concurrent chest CT angiogram report for  details.        Electronically Signed    By: Ilona Sorrel M.D.    On: 12/02/2019 13:26       Signed by Sharyn Blitz, MD on 12/02/2019  1:28 PM    Narrative & Impression    CLINICAL DATA:  Severe Aortic Stenosis.     EXAM:  Cardiac TAVR CT     TECHNIQUE:  The patient was scanned on a [employer]. A 120 kV  retrospective  scan was triggered in the descending thoracic aorta at  111 HU's. Gantry rotation speed was 250 msecs and collimation was .6  mm. No beta blockade or nitro were given. The 3D data set was  reconstructed in 5% intervals of the R-R cycle. Systolic and  diastolic phases were analyzed on a dedicated work station using  MPR, MIP and VRT modes. The patient received 80 cc of contrast.     FINDINGS:  Image quality: Excellent.     Noise artifact is: Limited.     Valve Morphology: The aortic valve is tricuspid. The leaflets are  severely calcified and severely thickened. Leaflet calcification  appears diffuse. The leaflets exhibit severely restricted movement  in systole     Aortic Valve Calcium score: 2336     Aortic annular dimension:     Phase assessed: 20%     Annular area: 462 mm2     Annular perimeter: 77.3 mm     Max diameter: 26.1 mm     Min diameter: 22.9 mm     Annular and subannular calcification: None.     Optimal coplanar projection: LAO 1 CRA 3     Coronary Artery Height above Annulus:     Left Main: 15.9 mm     Right Coronary: 16.3 mm     Sinus of Valsalva Measurements:     Non-coronary: 34 mm     Right-coronary: 33 mm     Left-coronary: 34 mm     Sinus of Valsalva Height:     Non-coronary: 26.0 mm     Right-coronary: 23.2 mm     Left-coronary: 22.5 mm     Sinotubular Junction: 27 mm with mild to moderate calcifications.     Ascending Thoracic Aorta: 31 mm with diffuse mixed density  atherosclerosis noted.     Coronary Arteries: CAC score of 802, which is 80th percentile for  age- and sex-matched controls. Normal coronary origin. Right  dominance. The study was performed without use of NTG and is  insufficient for plaque evaluation. The following assessment was  made nonetheless:     Left main: The left main is a large caliber vessel with a normal  take off from the left  coronary cusp that trifurcates into a LAD,  LCX, and ramus intermedius. There is minimal mixed density plaque  (<25%).     Left anterior descending artery: The proximal LAD contains mild  mixed density plaque (25-49%). The mid LAD contains minimal  calcified plaque (<25%). The distal LAD contains minimal  non-calcified  plaque (<25%).     Ramus intermedius: Large vessel with minimal calcified plaque  (<25%).     Left circumflex artery: The LCX is non-dominant with minimal  non-calcified plaque (<25%).     Right coronary artery: Dominant with normal take off from the [place].  The proximal and mid RCA segments contain minimal calcified plaque  (<25%). The distal RCA contains minimal non-calcified plaque. The  RCA terminates as a patent PDA and PLV branch.     Cardiac Morphology:     Right Atrium: Right atrial size is within normal limits.     Right Ventricle: The right ventricular cavity is within normal  limits.     Left Atrium: Left atrial size is normal in size with no left atrial  appendage filling defect.     Left Ventricle: The ventricular cavity size is within normal limits.  There are no stigmata of prior infarction. There is no abnormal  filling defect. Normal left ventricular function, LVEF=72%. No  regional wall motion abnormalities.     Pulmonary arteries: Normal in size without proximal filling defect.     Pulmonary veins: Normal pulmonary venous drainage.     Pericardium: Normal thickness with no significant effusion or  calcium present.     Mitral Valve: The mitral valve is normal structure with mild mitral  annular calcification.     Extra-cardiac findings: See attached radiology report for  non-cardiac structures.     IMPRESSION:  1. Tricuspid aortic valve with severe aortic stenosis (AoV calcium  score = 2336).     2. Annular measurements appropriate for 26 mm Edwards Sapien 3 TAVR     3. No  significant annular or subannular calcifications.     4. Sufficient coronary to annulus distance.     5. Optimal Fluoroscopic Angle for Delivery: LAO 1 CRA 3     6. Coronary calcium score of 802 which is 80th percentile for age-  and sex-matched controls.     7. Study performed without nitroglycerin but there appears to be  mild mixed density plaque in the proximal LAD (25-49%) and minimal  (<25%) CAD in the LCX/RCA.     [place] T. Audie Box, MD     Electronically Signed:  By: Eleonore Chiquito  On: 12/01/2019 22:06          CLINICAL DATA:  Inpatient. Severe aortic stenosis. Pre-TAVR  planning.     EXAM:  CT ANGIOGRAPHY CHEST, ABDOMEN AND PELVIS     TECHNIQUE:  Multidetector CT imaging through the chest, abdomen and pelvis was  performed using the standard protocol during bolus administration of  intravenous contrast. Multiplanar reconstructed images and MIPs were  obtained and reviewed to evaluate the vascular anatomy.     CONTRAST:  31m OMNIPAQUE IOHEXOL 350 MG/ML SOLN     COMPARISON:  11/26/2019 CT angiogram of the chest, abdomen and  pelvis.     FINDINGS:  CTA CHEST FINDINGS     Cardiovascular: Normal heart size. No significant pericardial  effusion/thickening. Diffuse thickening and coarse calcification of  the aortic valve. Three-vessel coronary atherosclerosis.  Atherosclerotic nonaneurysmal thoracic aorta. Normal caliber  pulmonary arteries. No central pulmonary emboli.     Mediastinum/Nodes: Scattered subcentimeter hypodense bilateral  thyroid nodules. Not clinically significant; no follow-up imaging  recommended (ref: J Am Coll Radiol. 2015 Feb;12(2): 143-50).  Unremarkable esophagus. No pathologically enlarged axillary,  mediastinal or hilar lymph nodes.     Lungs/Pleura: No pneumothorax. No pleural effusion. No acute  consolidative airspace disease or  lung masses. Anterior left upper  lobe tiny 3 mm  solid pulmonary nodule (series 5/image 46), stable  since recent chest CT. No additional significant pulmonary nodules.     Musculoskeletal: No aggressive appearing focal osseous lesions.  Moderate thoracic spondylosis. Symmetric moderate bilateral  gynecomastia, stable.     CTA ABDOMEN AND PELVIS FINDINGS     Hepatobiliary: Normal liver with no liver mass. Stable distended  gallbladder with layering subcentimeter calcified gallstones. No  gallbladder wall thickening or pericholecystic fluid. No biliary  ductal dilatation.     Pancreas: Normal, with no mass or duct dilation.     Spleen: Normal size. No mass.     Adrenals/Urinary Tract: Left adrenal 2.6 cm nodule with density 80  HU and right adrenal 1.7 cm nodule with density 57 HU, both  characterized as benign adenomas on recent noncontrast chest CT. No  overt hydronephrosis. No contour deforming renal masses. Normal  nondistended bladder.     Stomach/Bowel: Normal non-distended stomach. Normal caliber small  bowel with no small bowel wall thickening. Normal appendix. Normal  large bowel with no diverticulosis, large bowel wall thickening or  pericolonic fat stranding.     Vascular/Lymphatic: Atherosclerotic nonaneurysmal abdominal aorta.  No pathologically enlarged lymph nodes in the abdomen or pelvis.     Reproductive: Mild prostatomegaly. Brachytherapy seeds scattered  throughout the prostate.     Other: No pneumoperitoneum, ascites or focal fluid collection. Small  fat containing left inguinal hernia.     Musculoskeletal: No aggressive appearing focal osseous lesions. Mild  lumbar spondylosis.     VASCULAR MEASUREMENTS PERTINENT TO TAVR:     AORTA:     Minimal Aortic Diameter-14.8 x 13.9 mm     Severity of Aortic Calcification-moderate to severe     RIGHT PELVIS:     Right Common Iliac Artery -     Minimal Diameter-10.2 x 6.0 mm     Tortuosity-mild      Calcification-severe     Right External Iliac Artery -     Minimal Diameter-5.5 x 4.6 mm     Tortuosity-mild-to-moderate     Calcification-severe     Right Common Femoral Artery -     Minimal Diameter-7.3 x 6.7 mm     Tortuosity-mild     Calcification-moderate     LEFT PELVIS:     Left Common Iliac Artery -     Minimal Diameter-9.3 x 8.2 mm     Tortuosity-mild     Calcification-severe     Left External Iliac Artery -     Minimal Diameter-7.1 x 5.1 mm     Tortuosity-mild mild     Calcification-severe     Left Common Femoral Artery -     Minimal Diameter-8.3 x 7.0 mm     Tortuosity-mild     Calcification-moderate     Review of the MIP images confirms the above findings.     IMPRESSION:  1. Vascular findings and measurements pertinent to potential TAVR  procedure, as detailed.  2. Diffuse thickening and coarse calcification of the aortic valve,  compatible with the reported history of severe aortic stenosis.  3. Three-vessel coronary atherosclerosis.  4. Tiny 3 mm solid left upper lobe pulmonary nodule. No follow-up  needed if patient is low-risk. Non-contrast chest CT can be  considered in 12 months if patient is high-risk. This recommendation  follows the consensus statement: Guidelines for Management of  Incidental Pulmonary Nodules Detected on CT Images: From the  Fleischner Society 2017; Radiology  2017; 782:956-213.  5. Cholelithiasis.  6. Bilateral adrenal adenomas.  7. Mild prostatomegaly.  8. Small fat containing left inguinal hernia.  9. Aortic Atherosclerosis (ICD10-I70.0).        Electronically Signed    By: Ilona Sorrel M.D.    On: 12/02/2019 13:55        STS Risk Calculator:     Procedure: Isolated AVR  Risk of Mortality:  4.160%  Renal Failure:  NA  Permanent Stroke:  2.342%  Prolonged Ventilation:  11.722%  DSW  Infection:  0.185%  Reoperation:  3.991%  Morbidity or Mortality:  20.422%  Short Length of Stay:  15.658%  Long Length of Stay:  9.915%     Impression:     This 79 year old gentleman has stage D, severe, symptomatic aortic stenosis with New York Heart Association class II symptoms of exertional fatigue and shortness of breath consistent with chronic diastolic congestive heart failure.  I have personally reviewed his 2D echocardiogram, cardiac catheterization, and CTA studies.  His echocardiogram shows a calcified aortic valve with a mean transvalvular gradient of 26 mmHg and a peak gradient of 55 mmHg suggesting moderate aortic stenosis.  His dimensionless index was 0.31 with a calculated aortic valve area of 0.89 cm.  His exam and symptoms seem more consistent with severe aortic stenosis.  His gated cardiac CTA shows a severely calcified and thickened aortic valve with markedly restricted leaflet mobility consistent with severe aortic stenosis.  I agree that aortic valve replacement is indicated in this patient for relief of his symptoms and to prevent progressive left ventricular deterioration and difficulty with hemodialysis.  At 79 years old with end-stage renal disease on hemodialysis and multiple other comorbid risk factors I do not think he is a candidate for open surgical aortic valve placement.  I think transcatheter aortic valve replacement would be the best option for him.  His gated cardiac CTA shows anatomy suitable for transcatheter aortic valve replacement using a SAPIEN 3 valve.  His abdominal and pelvic CTA shows significant aortoiliac atherosclerotic disease but there appears to be adequate vessels to allow left transfemoral access.     The patient and his daughter were counseled at length regarding treatment alternatives for management of severe symptomatic aortic stenosis. The risks and benefits of surgical intervention has been discussed in detail. Long-term  prognosis with medical therapy was discussed. Alternative approaches such as conventional surgical aortic valve replacement, transcatheter aortic valve replacement, and palliative medical therapy were compared and contrasted at length. This discussion was placed in the context of the patient's own specific clinical presentation and past medical history. All of their questions have been addressed.      Following the decision to proceed with transcatheter aortic valve replacement, a discussion was held regarding what types of management strategies would be attempted intraoperatively in the event of life-threatening complications, including whether or not the patient would be considered a candidate for the use of cardiopulmonary bypass and/or conversion to open sternotomy for attempted surgical intervention.  I do not think he is a candidate for emergent sternotomy to manage any intraoperative complications given his advanced age and end-stage renal disease on hemodialysis.  The patient is aware of the fact that transient use of cardiopulmonary bypass may be necessary. The patient has been advised of a variety of complications that might develop including but not limited to risks of death, stroke, paravalvular leak, aortic dissection or other major vascular complications, aortic annulus rupture, device embolization, cardiac rupture or perforation, mitral  regurgitation, acute myocardial infarction, arrhythmia, heart block or bradycardia requiring permanent pacemaker placement, congestive heart failure, respiratory failure, renal failure, pneumonia, infection, other late complications related to structural valve deterioration or migration, or other complications that might ultimately cause a temporary or permanent loss of functional independence or other long term morbidity. The patient provides full informed consent for the procedure as described and all questions were answered.          Plan:      Transfemoral TAVR.     Gaye Pollack, MD

## 2020-01-19 ENCOUNTER — Inpatient Hospital Stay (HOSPITAL_COMMUNITY): Payer: Medicare Other

## 2020-01-19 ENCOUNTER — Encounter (HOSPITAL_COMMUNITY)
Admission: RE | Disposition: A | Discharge status: Home / Self Care | Payer: Medicare Other | Source: Home / Self Care | Attending: Cardiovascular Disease

## 2020-01-19 ENCOUNTER — Encounter (HOSPITAL_COMMUNITY): Payer: Self-pay | Admitting: Cardiovascular Disease

## 2020-01-19 ENCOUNTER — Inpatient Hospital Stay (HOSPITAL_COMMUNITY)
Admission: RE | Admit: 2020-01-19 | Discharge: 2020-01-20 | DRG: 266 | Disposition: A | Discharge status: Home / Self Care | Payer: Medicare Other | Attending: Cardiovascular Disease | Admitting: Cardiovascular Disease

## 2020-01-19 ENCOUNTER — Other Ambulatory Visit: Payer: Self-pay

## 2020-01-19 ENCOUNTER — Inpatient Hospital Stay (HOSPITAL_COMMUNITY): Payer: Medicare Other | Admitting: Certified Registered Nurse Anesthetist

## 2020-01-19 ENCOUNTER — Other Ambulatory Visit: Payer: Self-pay | Admitting: Physician Assistant

## 2020-01-19 ENCOUNTER — Inpatient Hospital Stay (HOSPITAL_COMMUNITY): Payer: Medicare Other | Admitting: Physician Assistant

## 2020-01-19 DIAGNOSIS — Z8546 Personal history of malignant neoplasm of prostate: Secondary | ICD-10-CM | POA: Diagnosis not present

## 2020-01-19 DIAGNOSIS — I4510 Unspecified right bundle-branch block: Secondary | ICD-10-CM | POA: Diagnosis present

## 2020-01-19 DIAGNOSIS — Z20822 Contact with and (suspected) exposure to covid-19: Secondary | ICD-10-CM | POA: Diagnosis present

## 2020-01-19 DIAGNOSIS — Z952 Presence of prosthetic heart valve: Secondary | ICD-10-CM | POA: Diagnosis not present

## 2020-01-19 DIAGNOSIS — I132 Hypertensive heart and chronic kidney disease with heart failure and with stage 5 chronic kidney disease, or end stage renal disease: Secondary | ICD-10-CM | POA: Diagnosis present

## 2020-01-19 DIAGNOSIS — I350 Nonrheumatic aortic (valve) stenosis: Principal | ICD-10-CM

## 2020-01-19 DIAGNOSIS — E781 Pure hyperglyceridemia: Secondary | ICD-10-CM | POA: Diagnosis present

## 2020-01-19 DIAGNOSIS — K219 Gastro-esophageal reflux disease without esophagitis: Secondary | ICD-10-CM | POA: Diagnosis present

## 2020-01-19 DIAGNOSIS — Z992 Dependence on renal dialysis: Secondary | ICD-10-CM | POA: Diagnosis not present

## 2020-01-19 DIAGNOSIS — Z66 Do not resuscitate: Secondary | ICD-10-CM | POA: Diagnosis present

## 2020-01-19 DIAGNOSIS — E785 Hyperlipidemia, unspecified: Secondary | ICD-10-CM | POA: Diagnosis present

## 2020-01-19 DIAGNOSIS — E1129 Type 2 diabetes mellitus with other diabetic kidney complication: Secondary | ICD-10-CM | POA: Diagnosis present

## 2020-01-19 DIAGNOSIS — N186 End stage renal disease: Secondary | ICD-10-CM | POA: Diagnosis present

## 2020-01-19 DIAGNOSIS — E7800 Pure hypercholesterolemia, unspecified: Secondary | ICD-10-CM | POA: Diagnosis present

## 2020-01-19 DIAGNOSIS — I10 Essential (primary) hypertension: Secondary | ICD-10-CM | POA: Diagnosis present

## 2020-01-19 DIAGNOSIS — H547 Unspecified visual loss: Secondary | ICD-10-CM

## 2020-01-19 DIAGNOSIS — I5033 Acute on chronic diastolic (congestive) heart failure: Secondary | ICD-10-CM | POA: Diagnosis present

## 2020-01-19 DIAGNOSIS — H548 Legal blindness, as defined in USA: Secondary | ICD-10-CM | POA: Diagnosis present

## 2020-01-19 DIAGNOSIS — Z79899 Other long term (current) drug therapy: Secondary | ICD-10-CM | POA: Diagnosis not present

## 2020-01-19 DIAGNOSIS — E1122 Type 2 diabetes mellitus with diabetic chronic kidney disease: Secondary | ICD-10-CM | POA: Diagnosis present

## 2020-01-19 DIAGNOSIS — Z794 Long term (current) use of insulin: Secondary | ICD-10-CM | POA: Diagnosis not present

## 2020-01-19 DIAGNOSIS — N400 Enlarged prostate without lower urinary tract symptoms: Secondary | ICD-10-CM | POA: Diagnosis present

## 2020-01-19 DIAGNOSIS — Z006 Encounter for examination for normal comparison and control in clinical research program: Secondary | ICD-10-CM

## 2020-01-19 HISTORY — PX: TEE WITHOUT CARDIOVERSION: SHX5443

## 2020-01-19 HISTORY — PX: TRANSCATHETER AORTIC VALVE REPLACEMENT, TRANSFEMORAL: SHX6400

## 2020-01-19 HISTORY — DX: Unspecified right bundle-branch block: I45.10

## 2020-01-19 LAB — POCT I-STAT, CHEM 8
BUN: 32 mg/dL — ABNORMAL HIGH (ref 8–23)
BUN: 30 mg/dL — ABNORMAL HIGH (ref 8–23)
BUN: 35 mg/dL — ABNORMAL HIGH (ref 8–23)
BUN: 31 mg/dL — ABNORMAL HIGH (ref 8–23)
Calcium, Ion: 1.11 mmol/L — ABNORMAL LOW (ref 1.15–1.40)
Calcium, Ion: 1.24 mmol/L (ref 1.15–1.40)
Calcium, Ion: 1.25 mmol/L (ref 1.15–1.40)
Calcium, Ion: 1.25 mmol/L (ref 1.15–1.40)
Chloride: 102 mmol/L (ref 98–111)
Chloride: 100 mmol/L (ref 98–111)
Chloride: 99 mmol/L (ref 98–111)
Chloride: 100 mmol/L (ref 98–111)
Creatinine, Ser: 7.6 mg/dL — ABNORMAL HIGH (ref 0.61–1.24)
Creatinine, Ser: 7.9 mg/dL — ABNORMAL HIGH (ref 0.61–1.24)
Creatinine, Ser: 7.5 mg/dL — ABNORMAL HIGH (ref 0.61–1.24)
Creatinine, Ser: 7.8 mg/dL — ABNORMAL HIGH (ref 0.61–1.24)
Glucose, Bld: 114 mg/dL — ABNORMAL HIGH (ref 70–99)
Glucose, Bld: 112 mg/dL — ABNORMAL HIGH (ref 70–99)
Glucose, Bld: 132 mg/dL — ABNORMAL HIGH (ref 70–99)
Glucose, Bld: 142 mg/dL — ABNORMAL HIGH (ref 70–99)
HCT: 38 % — ABNORMAL LOW (ref 39.0–52.0)
HCT: 33 % — ABNORMAL LOW (ref 39.0–52.0)
HCT: 34 % — ABNORMAL LOW (ref 39.0–52.0)
HCT: 32 % — ABNORMAL LOW (ref 39.0–52.0)
Hemoglobin: 12.9 g/dL — ABNORMAL LOW (ref 13.0–17.0)
Hemoglobin: 11.2 g/dL — ABNORMAL LOW (ref 13.0–17.0)
Hemoglobin: 11.6 g/dL — ABNORMAL LOW (ref 13.0–17.0)
Hemoglobin: 10.9 g/dL — ABNORMAL LOW (ref 13.0–17.0)
Potassium: 4.8 mmol/L (ref 3.5–5.1)
Potassium: 4.6 mmol/L (ref 3.5–5.1)
Potassium: 5 mmol/L (ref 3.5–5.1)
Potassium: 5.1 mmol/L (ref 3.5–5.1)
Sodium: 139 mmol/L (ref 135–145)
Sodium: 139 mmol/L (ref 135–145)
Sodium: 138 mmol/L (ref 135–145)
Sodium: 137 mmol/L (ref 135–145)
TCO2: 25 mmol/L (ref 22–32)
TCO2: 28 mmol/L (ref 22–32)
TCO2: 32 mmol/L (ref 22–32)
TCO2: 28 mmol/L (ref 22–32)

## 2020-01-19 LAB — ECHOCARDIOGRAM LIMITED
AR max vel: 0.88 cm2
AV Area VTI: 0.85 cm2
AV Area mean vel: 0.73 cm2
AV Mean grad: 45 mmHg
AV Peak grad: 39.6 mmHg
Ao pk vel: 3.15 m/s
Calc EF: 63.2 %
Single Plane A2C EF: 68.5 %
Single Plane A4C EF: 59 %

## 2020-01-19 LAB — POCT ACTIVATED CLOTTING TIME
Activated Clotting Time: 120 seconds
Activated Clotting Time: 104 seconds
Activated Clotting Time: 331 seconds

## 2020-01-19 LAB — GLUCOSE, CAPILLARY
Glucose-Capillary: 115 mg/dL — ABNORMAL HIGH (ref 70–99)
Glucose-Capillary: 137 mg/dL — ABNORMAL HIGH (ref 70–99)

## 2020-01-19 SURGERY — IMPLANTATION, AORTIC VALVE, TRANSCATHETER, FEMORAL APPROACH
Anesthesia: Monitor Anesthesia Care

## 2020-01-19 MED ORDER — LIDOCAINE 2% (20 MG/ML) 5 ML SYRINGE
INTRAMUSCULAR | Status: DC | PRN
  Administered 2020-01-19: 40 mg via INTRAVENOUS

## 2020-01-19 MED ORDER — INSULIN ASPART 100 UNIT/ML ~~LOC~~ SOLN
0.0000 [IU] | Freq: Three times a day (TID) | SUBCUTANEOUS | Status: DC
  Administered 2020-01-19 – 2020-01-20 (×2): 2 [IU] via SUBCUTANEOUS

## 2020-01-19 MED ORDER — GABAPENTIN 100 MG PO CAPS
100.0000 mg | ORAL_CAPSULE | Freq: Every day | ORAL | Status: DC
  Administered 2020-01-19: 100 mg via ORAL
  Filled 2020-01-19: qty 1

## 2020-01-19 MED ORDER — PROTAMINE SULFATE 10 MG/ML IV SOLN
INTRAVENOUS | Status: DC | PRN
  Administered 2020-01-19 (×2): 30 mg via INTRAVENOUS
  Administered 2020-01-19: 40 mg via INTRAVENOUS
  Administered 2020-01-19: 30 mg via INTRAVENOUS

## 2020-01-19 MED ORDER — MORPHINE SULFATE (PF) 2 MG/ML IV SOLN: 1.0000 mg | INTRAVENOUS | Status: DC | PRN

## 2020-01-19 MED ORDER — CLOPIDOGREL BISULFATE 75 MG PO TABS
75.0000 mg | ORAL_TABLET | Freq: Every day | ORAL | Status: DC
  Administered 2020-01-20: 75 mg via ORAL
  Filled 2020-01-19: qty 1

## 2020-01-19 MED ORDER — SODIUM CHLORIDE 0.9 % IV SOLN
1.5000 g | Freq: Two times a day (BID) | INTRAVENOUS | Status: DC
  Filled 2020-01-19: qty 1.5

## 2020-01-19 MED ORDER — SODIUM CHLORIDE 0.9 % IV SOLN: INTRAVENOUS | Status: DC

## 2020-01-19 MED ORDER — CHLORHEXIDINE GLUCONATE 4 % EX LIQD
60.0000 mL | Freq: Once | CUTANEOUS | Status: DC
  Filled 2020-01-19: qty 60

## 2020-01-19 MED ORDER — TRAMADOL HCL 50 MG PO TABS: 50.0000 mg | ORAL_TABLET | ORAL | Status: DC | PRN

## 2020-01-19 MED ORDER — NITROGLYCERIN IN D5W 200-5 MCG/ML-% IV SOLN: 0.0000 ug/min | INTRAVENOUS | Status: DC

## 2020-01-19 MED ORDER — PROPOFOL 10 MG/ML IV BOLUS
INTRAVENOUS | Status: DC | PRN
  Administered 2020-01-19: 10 ug/kg/min via INTRAVENOUS
  Administered 2020-01-19: 20 mg via INTRAVENOUS
  Administered 2020-01-19: 10 mg via INTRAVENOUS

## 2020-01-19 MED ORDER — ONDANSETRON HCL 4 MG/2ML IJ SOLN
INTRAMUSCULAR | Status: DC | PRN
  Administered 2020-01-19: 4 mg via INTRAVENOUS

## 2020-01-19 MED ORDER — ACETAMINOPHEN 650 MG RE SUPP: 650.0000 mg | Freq: Four times a day (QID) | RECTAL | Status: DC | PRN

## 2020-01-19 MED ORDER — ACETAMINOPHEN 325 MG PO TABS: 650.0000 mg | ORAL_TABLET | Freq: Four times a day (QID) | ORAL | Status: DC | PRN

## 2020-01-19 MED ORDER — SODIUM CHLORIDE 0.9 % IV SOLN: 250.0000 mL | INTRAVENOUS | Status: DC | PRN

## 2020-01-19 MED ORDER — FENTANYL CITRATE (PF) 100 MCG/2ML IJ SOLN
INTRAMUSCULAR | Status: DC | PRN
  Administered 2020-01-19 (×2): 25 ug via INTRAVENOUS

## 2020-01-19 MED ORDER — IOHEXOL 350 MG/ML SOLN
INTRAVENOUS | Status: AC
  Filled 2020-01-19: qty 1

## 2020-01-19 MED ORDER — ONDANSETRON HCL 4 MG/2ML IJ SOLN: 4.0000 mg | Freq: Four times a day (QID) | INTRAMUSCULAR | Status: DC | PRN

## 2020-01-19 MED ORDER — LIDOCAINE HCL (PF) 1 % IJ SOLN
INTRAMUSCULAR | Status: DC | PRN
  Administered 2020-01-19: 5 mL
  Administered 2020-01-19: 10 mL
  Administered 2020-01-19: 5 mL

## 2020-01-19 MED ORDER — IOHEXOL 350 MG/ML SOLN
INTRAVENOUS | Status: DC | PRN
  Administered 2020-01-19: 40.2 mL

## 2020-01-19 MED ORDER — CHLORHEXIDINE GLUCONATE 0.12 % MT SOLN
15.0000 mL | Freq: Once | OROMUCOSAL | Status: DC
  Filled 2020-01-19: qty 15

## 2020-01-19 MED ORDER — DEXMEDETOMIDINE (PRECEDEX) IN NS 20 MCG/5ML (4 MCG/ML) IV SYRINGE
PREFILLED_SYRINGE | INTRAVENOUS | Status: DC | PRN
  Administered 2020-01-19: 40 ug via INTRAVENOUS

## 2020-01-19 MED ORDER — PHENYLEPHRINE 40 MCG/ML (10ML) SYRINGE FOR IV PUSH (FOR BLOOD PRESSURE SUPPORT)
PREFILLED_SYRINGE | INTRAVENOUS | Status: DC | PRN
  Administered 2020-01-19 (×3): 80 ug via INTRAVENOUS

## 2020-01-19 MED ORDER — SODIUM CHLORIDE 0.9 % IV SOLN
750.0000 mg | INTRAVENOUS | Status: AC
  Administered 2020-01-20: 750 mg via INTRAVENOUS
  Filled 2020-01-19: qty 750

## 2020-01-19 MED ORDER — SODIUM CHLORIDE 0.9 % IV SOLN: INTRAVENOUS | Status: DC | PRN

## 2020-01-19 MED ORDER — LIDOCAINE HCL (PF) 1 % IJ SOLN
INTRAMUSCULAR | Status: AC
  Filled 2020-01-19: qty 30

## 2020-01-19 MED ORDER — HEPARIN (PORCINE) IN NACL 1000-0.9 UT/500ML-% IV SOLN
INTRAVENOUS | Status: DC | PRN
  Administered 2020-01-19: 500 mL

## 2020-01-19 MED ORDER — PANTOPRAZOLE SODIUM 40 MG PO TBEC
80.0000 mg | DELAYED_RELEASE_TABLET | Freq: Every day | ORAL | Status: DC
  Administered 2020-01-20: 80 mg via ORAL
  Filled 2020-01-19: qty 2

## 2020-01-19 MED ORDER — VANCOMYCIN HCL IN DEXTROSE 1-5 GM/200ML-% IV SOLN
1000.0000 mg | Freq: Once | INTRAVENOUS | Status: AC
  Administered 2020-01-19: 1000 mg via INTRAVENOUS
  Filled 2020-01-19: qty 200

## 2020-01-19 MED ORDER — SODIUM CHLORIDE 0.9 % IR SOLN
Status: DC | PRN
  Administered 2020-01-19: 1000 mL

## 2020-01-19 MED ORDER — ATORVASTATIN CALCIUM 10 MG PO TABS
10.0000 mg | ORAL_TABLET | Freq: Every day | ORAL | Status: DC
  Administered 2020-01-19: 10 mg via ORAL
  Filled 2020-01-19: qty 1

## 2020-01-19 MED ORDER — HEPARIN SODIUM (PORCINE) 1000 UNIT/ML IJ SOLN
INTRAMUSCULAR | Status: DC | PRN
  Administered 2020-01-19: 13000 [IU] via INTRAVENOUS

## 2020-01-19 MED ORDER — OXYCODONE HCL 5 MG PO TABS: 5.0000 mg | ORAL_TABLET | ORAL | Status: DC | PRN

## 2020-01-19 MED ORDER — PHENYLEPHRINE HCL-NACL 20-0.9 MG/250ML-% IV SOLN
0.0000 ug/min | INTRAVENOUS | Status: DC
  Administered 2020-01-19: 20 ug/min via INTRAVENOUS

## 2020-01-19 MED ORDER — SODIUM CHLORIDE 0.9 % IV SOLN: INTRAVENOUS | Status: AC

## 2020-01-19 MED ORDER — SODIUM CHLORIDE 0.9% FLUSH: 3.0000 mL | INTRAVENOUS | Status: DC | PRN

## 2020-01-19 MED ORDER — CHLORHEXIDINE GLUCONATE 0.12 % MT SOLN
OROMUCOSAL | Status: AC
  Filled 2020-01-19: qty 15

## 2020-01-19 MED ORDER — HEPARIN (PORCINE) IN NACL 1000-0.9 UT/500ML-% IV SOLN
INTRAVENOUS | Status: AC
  Filled 2020-01-19: qty 1000

## 2020-01-19 MED ORDER — SODIUM CHLORIDE 0.9% FLUSH
3.0000 mL | Freq: Two times a day (BID) | INTRAVENOUS | Status: DC
  Administered 2020-01-19 – 2020-01-20 (×2): 3 mL via INTRAVENOUS

## 2020-01-19 MED ORDER — PHENOL 1.4 % MT LIQD
1.0000 | OROMUCOSAL | Status: DC | PRN
  Administered 2020-01-19: 1 via OROMUCOSAL
  Filled 2020-01-19: qty 177

## 2020-01-19 MED ORDER — CHLORHEXIDINE GLUCONATE 4 % EX LIQD: 30.0000 mL | CUTANEOUS | Status: DC

## 2020-01-19 SURGICAL SUPPLY — 33 items
BAG SNAP BAND KOVER 36X36 (MISCELLANEOUS) ×6 IMPLANT
BLANKET WARM UNDERBOD FULL ACC (MISCELLANEOUS) ×3 IMPLANT
CABLE ADAPT PACING TEMP 12FT (ADAPTER) ×6 IMPLANT
CATH 26 ULTRA DELIVERY (CATHETERS) ×3 IMPLANT
CATH DIAG 6FR PIGTAIL ANGLED (CATHETERS) ×6 IMPLANT
CATH INFINITI 6F AL2 (CATHETERS) ×3 IMPLANT
CATH S G BIP PACING (CATHETERS) ×3 IMPLANT
CATH TEMPO TEMP PACE LEAD (CATHETERS) ×2 IMPLANT
CATHETER TEMPO TEMP PACE LEAD (CATHETERS) ×3
CLOSURE MYNX CONTROL 6F/7F (Vascular Products) ×3 IMPLANT
CLOSURE PERCLOSE PROSTYLE (VASCULAR PRODUCTS) ×9 IMPLANT
CRIMPER (MISCELLANEOUS) ×3 IMPLANT
DEVICE INFLATION ATRION QL2530 (MISCELLANEOUS) ×3 IMPLANT
GUIDEWIRE SAFE TJ AMPLATZ EXST (WIRE) ×3 IMPLANT
KIT HEART LEFT (KITS) ×3 IMPLANT
KIT MICROPUNCTURE NIT STIFF (SHEATH) ×3 IMPLANT
PACK CARDIAC CATHETERIZATION (CUSTOM PROCEDURE TRAY) ×3 IMPLANT
SHEATH 14X36 EDWARDS (SHEATH) ×3 IMPLANT
SHEATH BRITE TIP 7FR 35CM (SHEATH) ×3 IMPLANT
SHEATH PINNACLE 6F 10CM (SHEATH) ×3 IMPLANT
SHEATH PINNACLE 8F 10CM (SHEATH) ×6 IMPLANT
SHEATH PROBE COVER 6X72 (BAG) ×3 IMPLANT
STOPCOCK MORSE 400PSI 3WAY (MISCELLANEOUS) ×6 IMPLANT
TRANSDUCER W/STOPCOCK (MISCELLANEOUS) ×6 IMPLANT
TUBE CONN 8.8X1320 FR HP M-F (CONNECTOR) ×3 IMPLANT
TUBING ART PRESS 72  MALE/FEM (TUBING) ×3
TUBING ART PRESS 72 MALE/FEM (TUBING) ×2 IMPLANT
TUBING CIL FLEX 10 FLL-RA (TUBING) ×3 IMPLANT
VALVE 26 ULTRA SAPIEN KIT (Valve) ×3 IMPLANT
WIRE AMPLATZ SS-J .035X180CM (WIRE) ×3 IMPLANT
WIRE EMERALD 3MM-J .035X150CM (WIRE) ×3 IMPLANT
WIRE EMERALD 3MM-J .035X260CM (WIRE) ×3 IMPLANT
WIRE EMERALD ST .035X260CM (WIRE) ×3 IMPLANT

## 2020-01-19 NOTE — Anesthesia Procedure Notes (Signed)
Procedure Name: MAC Date/Time: 01/19/2020 1:17 PM Performed by: Janace Litten, CRNA Pre-anesthesia Checklist: Patient identified, Emergency Drugs available, Suction available and Patient being monitored Patient Re-evaluated:Patient Re-evaluated prior to induction Oxygen Delivery Method: Simple face mask

## 2020-01-19 NOTE — Anesthesia Procedure Notes (Signed)
Arterial Line Insertion Start/End11/16/2021 11:15 AM, 01/19/2020 11:20 AM Performed by: Lowella Dell, CRNA, CRNA  Patient location: Pre-op. Preanesthetic checklist: patient identified, IV checked, site marked, risks and benefits discussed, surgical consent, monitors and equipment checked, pre-op evaluation, timeout performed and anesthesia consent Lidocaine 1% used for infiltration Right, radial was placed Catheter size: 20 G Hand hygiene performed  and maximum sterile barriers used   Attempts: 1 Procedure performed without using ultrasound guided technique. Following insertion, dressing applied and Biopatch. Post procedure assessment: normal  Patient tolerated the procedure well with no immediate complications.

## 2020-01-19 NOTE — Progress Notes (Signed)
@  2215 Dr. Kalman Shan, on-call for Cardiology, paged regarding presence of pt's yellow state DNR form in paper chart while having a Full Code order in [place]. Form confirmed with pt's daughter over phone and pt at bedside. Page promptly returned and Epic order changed to DNR. DNR Band applied.

## 2020-01-19 NOTE — Transfer of Care (Signed)
Immediate Anesthesia Transfer of Care Note  Patient: BETZALEL UMBARGER  Procedure(s) Performed: TRANSCATHETER AORTIC VALVE REPLACEMENT, LEFT TRANSFEMORAL (Left ) TRANSESOPHAGEAL ECHOCARDIOGRAM (TEE) (N/A )  Patient Location: Cath Lab  Anesthesia Type:MAC  Level of Consciousness: drowsy, patient cooperative and responds to stimulation  Airway & Oxygen Therapy: Patient Spontanous Breathing  Post-op Assessment: Report given to RN and Post -op Vital signs reviewed and stable  Post vital signs: Reviewed and stable  Last Vitals:  Vitals Value Taken Time  BP 94/43 01/19/20 1530  Temp 36.3 C 01/19/20 1529  Pulse 62 01/19/20 1530  Resp 15 01/19/20 1530  SpO2 92 % 01/19/20 1530  Vitals shown include unvalidated device data.  Last Pain:  Vitals:   01/19/20 1529  TempSrc: Temporal  PainSc: 0-No pain         Complications: No complications documented.

## 2020-01-19 NOTE — Progress Notes (Signed)
PHARMACY NOTE:  ANTIMICROBIAL RENAL DOSAGE ADJUSTMENT  Current antimicrobial regimen includes a mismatch between antimicrobial dosage and estimated renal function.  As per policy approved by the Pharmacy & Therapeutics and Medical Executive Committees, the antimicrobial dosage will be adjusted accordingly.  Current antimicrobial dosage:  zinacef 1.5mg  IV q12h x 4 doses  Indication: surgical prophylaxis  Renal Function:  Estimated Creatinine Clearance: 7.1 mL/min (A) (by C-G formula based on SCr of 7.8 mg/dL (H)). [x]      On intermittent HD, scheduled: []      On CRRT    Antimicrobial dosage has been changed to:  Zinacef 750mg  IV Q24hr x1 (next dose 11/17)  Additional comments:   Thank you for allowing pharmacy to be a part of this patient's care.  Hildred Laser, PharmD Clinical Pharmacist **Pharmacist phone directory can now be found on [email or web address] ([email or web address]).  Listed under [facility].

## 2020-01-19 NOTE — Progress Notes (Signed)
  [place] VALVE TEAM  Patient doing well s/p TAVR. He is hemodynamically stable but BP is soft. Groin sites stable. ECG with sinus and old RBBB. No high grade block. Has tempo wire in IJ. Has not need back up pacer. Will leave in overnight and I will pull in the AM. Plan for early ambulation after bedrest completed and hopeful discharge over the next 24-48 hours.   [name] PA-C  [facility]  Pager [phone]

## 2020-01-19 NOTE — CV Procedure (Signed)
[facility]  TAVR OPERATIVE NOTE   Date of Procedure:  01/19/2020  Preoperative Diagnosis: Severe Aortic Stenosis   Postoperative Diagnosis: Same   Procedure:    Transcatheter Aortic Valve Replacement - Transfemoral Approach  Edwards Sapien 3 THV (size 26 mm, model # [id number], serial # [id number])   Co-Surgeons:  Lauree Chandler, MD and Gaye Pollack, MD   Anesthesiologist:  Ola Spurr  Echocardiographer:  Meda Coffee  Pre-operative Echo Findings:  Severe aortic stenosis  Normal left ventricular systolic function  Post-operative Echo Findings:  No paravalvular leak  Normal left ventricular systolic function  BRIEF CLINICAL NOTE AND INDICATIONS FOR SURGERY  Elijah White is a 79 y.o. male with a hx of ESRD on HD (TTS), HTN, DMT2, legal blindness, anemia, RBBB, chronic diastolic CHF and severe aortic stenosis who is here today for TAVR.   During the course of the patient's preoperative work up they have been evaluated comprehensively by a multidisciplinary team of specialists coordinated through the [facility] in the [facility].  They have been demonstrated to suffer from symptomatic severe aortic stenosis as noted above. The patient has been counseled extensively as to the relative risks and benefits of all options for the treatment of severe aortic stenosis including long term medical therapy, conventional surgery for aortic valve replacement, and transcatheter aortic valve replacement.  The patient has been independently evaluated by Dr. Cyndia Bent with CT surgery and they are felt to be at high risk for conventional surgical aortic valve replacement. The surgeon indicated the patient would be a poor candidate for conventional surgery. Based upon review of all of the patient's preoperative diagnostic tests they are felt to be candidate for transcatheter aortic valve replacement using the transfemoral approach as  an alternative to high risk conventional surgery.    Following the decision to proceed with transcatheter aortic valve replacement, a discussion has been held regarding what types of management strategies would be attempted intraoperatively in the event of life-threatening complications, including whether or not the patient would be considered a candidate for the use of cardiopulmonary bypass and/or conversion to open sternotomy for attempted surgical intervention.  The patient has been advised of a variety of complications that might develop peculiar to this approach including but not limited to risks of death, stroke, paravalvular leak, aortic dissection or other major vascular complications, aortic annulus rupture, device embolization, cardiac rupture or perforation, acute myocardial infarction, arrhythmia, heart block or bradycardia requiring permanent pacemaker placement, congestive heart failure, respiratory failure, renal failure, pneumonia, infection, other late complications related to structural valve deterioration or migration, or other complications that might ultimately cause a temporary or permanent loss of functional independence or other long term morbidity.  The patient provides full informed consent for the procedure as described and all questions were answered preoperatively.    DETAILS OF THE OPERATIVE PROCEDURE  PREPARATION:   The patient is brought to the operating room on the above mentioned date and central monitoring was established by the anesthesia team including placement of a radial arterial line. The patient is placed in the supine position on the operating table.  Intravenous antibiotics are administered. Conscious sedation is used.   Baseline transthoracic echocardiogram was performed. The patient's chest, abdomen, both groins, and both lower extremities are prepared and draped in a sterile manner. A time out procedure is performed.   PERIPHERAL ACCESS:   The right neck  was prepped and draped. Using an u/s, I engaged the  right IJ vein with a micropuncture needle and then converted this to a J wire. A 8 French sheath was then placed in the IJ. A Tempo temporary pacing wire was then advanced into the RV through the IJ sheath. We were unable to get consistent pacing capture with this catheter.   Using the modified Seldinger technique, femoral arterial and venous access were obtained with placement of 6 Fr sheaths on the right side using u/s guidance.  A pigtail diagnostic catheter was passed through the femoral arterial sheath under fluoroscopic guidance into the aortic root.  A temporary transvenous pacemaker catheter was passed through the femoral venous sheath under fluoroscopic guidance into the right ventricle.  The pacemaker was tested to ensure stable lead placement and pacemaker capture. Aortic root angiography was performed in order to determine the optimal angiographic angle for valve deployment.  TRANSFEMORAL ACCESS:  A micropuncture kit was used to gain access to the left femoral artery using u/s guidance. Position confirmed with angiography. Pre-closure with double ProGlide closure devices. The patient was heparinized systemically and ACT verified > 250 seconds.    A 14 Fr transfemoral E-sheath was introduced into the left femoral artery after progressively dilating over an Amplatz superstiff wire. An AL-2 catheter was used to direct a straight-tip exchange length wire across the native aortic valve into the left ventricle. This was exchanged out for a pigtail catheter and position was confirmed in the LV apex. Simultaneous LV and Ao pressures were recorded.  The pigtail catheter was then exchanged for an Amplatz Extra-stiff wire in the LV apex.   TRANSCATHETER HEART VALVE DEPLOYMENT:  An Edwards Sapien 3 THV (size 26 mm) was prepared and crimped per manufacturer's guidelines, and the proper orientation of the valve is confirmed on the [employer] delivery  system. The valve was advanced through the introducer sheath using normal technique until in an appropriate position in the abdominal aorta beyond the sheath tip. The balloon was then retracted and using the fine-tuning wheel was centered on the valve. The valve was then advanced across the aortic arch using appropriate flexion of the catheter. The valve was carefully positioned across the aortic valve annulus. The Commander catheter was retracted using normal technique. Once final position of the valve has been confirmed by angiographic assessment, the valve is deployed while temporarily holding ventilation and during rapid ventricular pacing to maintain systolic blood pressure < 50 mmHg and pulse pressure < 10 mmHg. The balloon inflation is held for >3 seconds after reaching full deployment volume. Once the balloon has fully deflated the balloon is retracted into the ascending aorta and valve function is assessed using TTE. There is felt to be no paravalvular leak and no central aortic insufficiency.  The patient's hemodynamic recovery following valve deployment is good.  The deployment balloon and guidewire are both removed. Echo demostrated acceptable post-procedural gradients, stable mitral valve function, and no AI.   PROCEDURE COMPLETION:  The sheath was then removed and closure devices were completed. Protamine was administered once femoral arterial repair was complete. The temporary pacemaker, pigtail catheters and femoral sheaths were removed with a Mynx closure device placed in the artery and manual pressure used for venous hemostasis. The Tempo pacing wire was left in place from the right IJ vein.    The patient tolerated the procedure well and is transported to the surgical intensive care in stable condition. There were no immediate intraoperative complications. All sponge instrument and needle counts are verified correct at completion of the operation.  No blood products were administered  during the operation.  The patient received a total of 40.2 mL of intravenous contrast during the procedure.  Lauree Chandler MD 01/19/2020 3:22 PM

## 2020-01-19 NOTE — Progress Notes (Signed)
°  Echocardiogram 2D Echocardiogram has been performed.  Elijah White 01/19/2020, 2:54 PM

## 2020-01-19 NOTE — Op Note (Addendum)
[facility]   MULTIDISCIPLINARY HEART VALVE TEAM   TAVR OPERATIVE NOTE   Date of Procedure:  01/19/2020  Preoperative Diagnosis: Severe Aortic Stenosis   Postoperative Diagnosis: Same   Procedure:    Transcatheter Aortic Valve Replacement - Percutaneous Left Transfemoral Approach  Edwards Sapien 3 Ultra THV (size 26 mm, model # [id number], serial # [id number])   Co-Surgeons:  Gaye Pollack, MD and Lauree Chandler, MD   Anesthesiologist:  Suzette Battiest, MD  Echocardiographer:  Ena Dawley, MD  Pre-operative Echo Findings:  Severe aortic stenosis  Normal left ventricular systolic function  Post-operative Echo Findings:  No paravalvular leak  Normal left ventricular systolic function   BRIEF CLINICAL NOTE AND INDICATIONS FOR SURGERY  This 79 year old gentleman has stage D, severe, symptomatic aortic stenosis with New York Heart Association class II symptoms of exertional fatigue and shortness of breath consistent with chronic diastolic congestive heart failure.  I have personally reviewed his 2D echocardiogram, cardiac catheterization, and CTA studies.  His echocardiogram shows a calcified aortic valve with a mean transvalvular gradient of 26 mmHg and a peak gradient of 55 mmHg suggesting moderate aortic stenosis.  His dimensionless index was 0.31 with a calculated aortic valve area of 0.89 cm.  His exam and symptoms seem more consistent with severe aortic stenosis.  His gated cardiac CTA shows a severely calcified and thickened aortic valve with markedly restricted leaflet mobility consistent with severe aortic stenosis.  I agree that aortic valve replacement is indicated in this patient for relief of his symptoms and to prevent progressive left ventricular deterioration and difficulty with hemodialysis.  At 79 years old with end-stage renal disease on hemodialysis and multiple other comorbid risk factors I do not think he is a candidate for open  surgical aortic valve placement.  I think transcatheter aortic valve replacement would be the best option for him.  His gated cardiac CTA shows anatomy suitable for transcatheter aortic valve replacement using a SAPIEN 3 valve.  His abdominal and pelvic CTA shows significant aortoiliac atherosclerotic disease but there appears to be adequate vessels to allow left transfemoral access.  The patient and his daughter were counseled at length regarding treatment alternatives for management of severe symptomatic aortic stenosis. The risks and benefits of surgical intervention has been discussed in detail. Long-term prognosis with medical therapy was discussed. Alternative approaches such as conventional surgical aortic valve replacement, transcatheter aortic valve replacement, and palliative medical therapy were compared and contrasted at length. This discussion was placed in the context of the patient's own specific clinical presentation and past medical history. All of their questions have been addressed.   Following the decision to proceed with transcatheter aortic valve replacement, a discussion was held regarding what types of management strategies would be attempted intraoperatively in the event of life-threatening complications, including whether or not the patient would be considered a candidate for the use of cardiopulmonary bypass and/or conversion to open sternotomy for attempted surgical intervention.  I do not think he is a candidate for emergent sternotomy to manage any intraoperative complications given his advanced age and end-stage renal disease on hemodialysis.  The patient is aware of the fact that transient use of cardiopulmonary bypass may be necessary. The patient has been advised of a variety of complications that might develop including but not limited to risks of death, stroke, paravalvular leak, aortic dissection or other major vascular complications, aortic annulus rupture, device  embolization, cardiac rupture or perforation, mitral regurgitation, acute myocardial infarction, arrhythmia, heart  block or bradycardia requiring permanent pacemaker placement, congestive heart failure, respiratory failure, renal failure, pneumonia, infection, other late complications related to structural valve deterioration or migration, or other complications that might ultimately cause a temporary or permanent loss of functional independence or other long term morbidity. The patient provides full informed consent for the procedure as described and all questions were answered.     DETAILS OF THE OPERATIVE PROCEDURE  PREPARATION:    The patient was brought to the operating room on the above mentioned date and appropriate monitoring was established by the anesthesia team. The patient was placed in the supine position on the operating table.  Intravenous antibiotics were administered. The patient was monitored closely throughout the procedure under conscious sedation.  Baseline transthoracic echocardiogram was performed. The patient's right neck, chest and abdomen and both groins were prepped and draped in a sterile manner. A time out procedure was performed.   PERIPHERAL ACCESS:    The right internal jugular vein was located with a micropuncture needle and converted to a J-wire. An 8 F sheath was inserted over the wire. A Tempo temporary pacing wire was advanced to the RV but we were unable to get consistent pacing capture and therefore decided to leave it in place for postop pacing if needed but place a femoral pacing catheter.  Using the modified Seldinger technique, femoral arterial and venous access was obtained with placement of 6 Fr sheaths on the right side.  A pigtail diagnostic catheter was passed through the right arterial sheath under fluoroscopic guidance into the aortic root.  A temporary transvenous pacemaker catheter was passed through the right femoral venous sheath under  fluoroscopic guidance into the right ventricle.  The femoral pacemaker was tested to ensure stable lead placement and pacemaker capture. Aortic root angiography was performed in order to determine the optimal angiographic angle for valve deployment.   TRANSFEMORAL ACCESS:   Percutaneous transfemoral access and sheath placement was performed using ultrasound guidance.  The left common femoral artery was cannulated using a micropuncture needle and appropriate location was verified using hand injection angiogram.  A pair of Abbott Perclose percutaneous closure devices were placed and a 6 French sheath replaced into the femoral artery.  The patient was heparinized systemically and ACT verified > 250 seconds.    A 14 Fr transfemoral E-sheath was introduced into the left common femoral artery after progressively dilating over an Amplatz superstiff wire. An AL-2 catheter was used to direct a straight-tip exchange length wire across the native aortic valve into the left ventricle. This was exchanged out for a pigtail catheter and position was confirmed in the LV apex. Simultaneous LV and Ao pressures were recorded.  The pigtail catheter was exchanged for an Amplatz Extra-stiff wire in the LV apex.    BALLOON AORTIC VALVULOPLASTY:   Not performed   TRANSCATHETER HEART VALVE DEPLOYMENT:   An Edwards Sapien 3 Ultra transcatheter heart valve (size 26 mm) was prepared and crimped per manufacturer's guidelines, and the proper orientation of the valve is confirmed on the [employer] delivery system. The valve was advanced through the introducer sheath using normal technique until in an appropriate position in the abdominal aorta beyond the sheath tip. The balloon was then retracted and using the fine-tuning wheel was centered on the valve. The valve was then advanced across the aortic arch using appropriate flexion of the catheter. The valve was carefully positioned across the aortic valve annulus. The  Commander catheter was retracted using normal technique. Once final  position of the valve has been confirmed by angiographic assessment, the valve is deployed while temporarily holding ventilation and during rapid ventricular pacing to maintain systolic blood pressure < 50 mmHg and pulse pressure < 10 mmHg. The balloon inflation is held for >3 seconds after reaching full deployment volume. Once the balloon has fully deflated the balloon is retracted into the ascending aorta and valve function is assessed using echocardiography. There is felt to be no paravalvular leak and no central aortic insufficiency.  The patient's hemodynamic recovery following valve deployment is good.  Post-deployment gradient was low. The deployment balloon and guidewire are both removed.    PROCEDURE COMPLETION:   The sheath was removed and femoral artery closure performed.  Protamine was administered once femoral arterial repair was complete. The femoral temporary pacemaker, pigtail catheter and femoral sheaths were removed with manual pressure used for hemostasis of the vein.  A Mynx femoral closure device was utilized following removal of the diagnostic sheath in the right femoral artery.  The patient tolerated the procedure well and is transported to the cath lab recovery area in stable condition. There were no immediate intraoperative complications. All sponge instrument and needle counts are verified correct at completion of the operation.   No blood products were administered during the operation.  The patient received a total of 40.2 mL of intravenous contrast during the procedure.   Gaye Pollack, MD 01/19/2020

## 2020-01-19 NOTE — Anesthesia Preprocedure Evaluation (Signed)
Anesthesia Evaluation  Patient identified by MRN, date of birth, ID band Patient awake    Reviewed: Allergy & Precautions, NPO status , Patient's Chart, lab work & pertinent test results  Airway Mallampati: II  TM Distance: >3 FB Neck ROM: Full    Dental  (+) Dental Advisory Given   Pulmonary former smoker,    breath sounds clear to auscultation       Cardiovascular hypertension, Pt. on medications and Pt. on home beta blockers + dysrhythmias  Rhythm:Regular Rate:Normal     Neuro/Psych negative neurological ROS     GI/Hepatic Neg liver ROS, GERD  ,  Endo/Other  diabetes, Type 2  Renal/GU ESRF and DialysisRenal disease     Musculoskeletal   Abdominal   Peds  Hematology  (+) anemia ,   Anesthesia Other Findings   Reproductive/Obstetrics                             Anesthesia Physical Anesthesia Plan  ASA: IV  Anesthesia Plan: MAC   Post-op Pain Management:    Induction:   PONV Risk Score and Plan: 1 and Propofol infusion, Ondansetron and Treatment may vary due to age or medical condition  Airway Management Planned: Natural Airway and Simple Face Mask  Additional Equipment:   Intra-op Plan:   Post-operative Plan:   Informed Consent: I have reviewed the patients History and Physical, chart, labs and discussed the procedure including the risks, benefits and alternatives for the proposed anesthesia with the patient or authorized representative who has indicated his/her understanding and acceptance.     Dental advisory given  Plan Discussed with: CRNA  Anesthesia Plan Comments:         Anesthesia Quick Evaluation

## 2020-01-20 ENCOUNTER — Telehealth: Payer: Medicare Other

## 2020-01-20 ENCOUNTER — Inpatient Hospital Stay (INDEPENDENT_AMBULATORY_CARE_PROVIDER_SITE_OTHER): Payer: Medicare Other

## 2020-01-20 ENCOUNTER — Inpatient Hospital Stay (HOSPITAL_COMMUNITY): Payer: Medicare Other

## 2020-01-20 DIAGNOSIS — I350 Nonrheumatic aortic (valve) stenosis: Principal | ICD-10-CM

## 2020-01-20 DIAGNOSIS — I4510 Unspecified right bundle-branch block: Secondary | ICD-10-CM

## 2020-01-20 DIAGNOSIS — Z952 Presence of prosthetic heart valve: Secondary | ICD-10-CM

## 2020-01-20 LAB — GLUCOSE, CAPILLARY
Glucose-Capillary: 70 mg/dL (ref 70–99)
Glucose-Capillary: 125 mg/dL — ABNORMAL HIGH (ref 70–99)

## 2020-01-20 LAB — CBC
HCT: 33.5 % — ABNORMAL LOW (ref 39.0–52.0)
Hemoglobin: 10.7 g/dL — ABNORMAL LOW (ref 13.0–17.0)
MCH: 32.6 pg (ref 26.0–34.0)
MCHC: 31.9 g/dL (ref 30.0–36.0)
MCV: 102.1 fL — ABNORMAL HIGH (ref 80.0–100.0)
Platelets: 156 10*3/uL (ref 150–400)
RBC: 3.28 MIL/uL — ABNORMAL LOW (ref 4.22–5.81)
RDW: 12.9 % (ref 11.5–15.5)
WBC: 6.2 10*3/uL (ref 4.0–10.5)
nRBC: 0 % (ref 0.0–0.2)

## 2020-01-20 LAB — BASIC METABOLIC PANEL
Anion gap: 12 (ref 5–15)
BUN: 34 mg/dL — ABNORMAL HIGH (ref 8–23)
CO2: 25 mmol/L (ref 22–32)
Calcium: 9.4 mg/dL (ref 8.9–10.3)
Chloride: 103 mmol/L (ref 98–111)
Creatinine, Ser: 8.19 mg/dL — ABNORMAL HIGH (ref 0.61–1.24)
GFR, Estimated: 6 mL/min — ABNORMAL LOW (ref >60)
Glucose, Bld: 74 mg/dL (ref 70–99)
Potassium: 4.9 mmol/L (ref 3.5–5.1)
Sodium: 140 mmol/L (ref 135–145)

## 2020-01-20 LAB — MAGNESIUM: Magnesium: 2 mg/dL (ref 1.7–2.4)

## 2020-01-20 MED ORDER — PANTOPRAZOLE SODIUM 40 MG PO TBEC: 40.0000 mg | DELAYED_RELEASE_TABLET | Freq: Every day | ORAL | 1 refills | Status: DC

## 2020-01-20 MED ORDER — CLOPIDOGREL BISULFATE 75 MG PO TABS: 75.0000 mg | ORAL_TABLET | Freq: Every day | ORAL | 1 refills | Status: DC

## 2020-01-20 MED FILL — Heparin Sod (Porcine)-NaCl IV Soln 1000 Unit/500ML-0.9%: INTRAVENOUS | Qty: 500 | Status: AC

## 2020-01-20 NOTE — Progress Notes (Signed)
Mobility Specialist: Progress Note   01/20/20 1516  Mobility  Activity Ambulated in hall  Level of Assistance Minimal assist, patient does 75% or more  Assistive Device Front wheel walker  Distance Ambulated (ft) 300 ft  Mobility Response Tolerated well  Mobility performed by Mobility specialist  Bed Position Chair  $Mobility charge 1 Mobility   Pt asx during ambulation.   [facility] Sumner Kirchman Mobility Specialist

## 2020-01-20 NOTE — Plan of Care (Signed)
DNR noted in the patient's chart and confirmed with patient and family. Code status updated.

## 2020-01-20 NOTE — Discharge Instructions (Signed)
Nexium was changed to Protonix given potential drug drug interaction with Plavix. When you are done with plavix in 6 months, you can resume Nexium   ACTIVITY AND EXERCISE . Daily activity and exercise are an important part of your recovery. People recover at different rates depending on their general health and type of valve procedure. . Most people recovering from TAVR feel better relatively quickly  . No lifting, pushing, pulling more than 10 pounds (examples to avoid: groceries, vacuuming, gardening, golfing):             - For one week with a procedure through the groin.             - For six weeks for procedures through the chest wall or neck. NOTE: You will typically see one of our providers 7-14 days after your procedure to discuss [place] the above activities.      DRIVING . Do not drive until you are seen for follow up and cleared by a provider. Generally, we ask patient to not drive for 1 week after their procedure. . If you have been told by your doctor in the past that you may not drive, you must talk with him/her before you begin driving again.   DRESSING . Groin site: you may leave the clear dressing over the site for up to one week or until it falls off.   HYGIENE . If you had a femoral (leg) procedure, you may take a shower when you return home. After the shower, pat the site dry. Do NOT use powder, oils or lotions in your groin area until the site has completely healed. . If you had a chest procedure, you may shower when you return home unless specifically instructed not to by your discharging practitioner.             - DO NOT scrub incision; pat dry with a towel.             - DO NOT apply any lotions, oils, powders to the incision.             - No tub baths / swimming for at least 2 weeks. . If you notice any fevers, chills, increased pain, swelling, bleeding or pus, please contact your doctor.   ADDITIONAL INFORMATION . If you are going to have an upcoming  dental procedure, please contact our office as you will require antibiotics ahead of time to prevent infection on your heart valve.    If you have any questions or concerns you can call the structural heart phone during normal business hours 8am-4pm. If you have an urgent need after hours or weekends please call [phone] to talk to the on call provider for general cardiology. If you have an emergency that requires immediate attention, please call 911.    After TAVR Checklist  Check  Test Description   Follow up appointment in 1-2 weeks  You will see our structural heart physician assistant, Nell Range. Your incision sites will be checked and you will be cleared to drive and resume all normal activities if you are doing well.     1 month echo and follow up  You will have an echo to check on your new heart valve and be seen back in the office by Nell Range. Many times the echo is not read by your appointment time, but Joellen Jersey will call you later that day or the following day to report your results.   Follow up  with your primary cardiologist You will need to be seen by your primary cardiologist in the following 3-6 months after your 1 month appointment in the valve clinic. Often times your Plavix or Aspirin will be discontinued during this time, but this is decided on a case by case basis.    1 year echo and follow up You will have another echo to check on your heart valve after 1 year and be seen back in the office by Nell Range. This your last structural heart visit.   Bacterial endocarditis prophylaxis  You will have to take antibiotics for the rest of your life before all dental procedures (even teeth cleanings) to protect your heart valve. Antibiotics are also required before some surgeries. Please check with your cardiologist before scheduling any surgeries. Also, please make sure to tell us if you have a penicillin allergy as you will require an alternative antibiotic.

## 2020-01-20 NOTE — Progress Notes (Signed)
Zio patch placed onto patient.  All instructions and information reviewed with patient, they verbalize understanding with no questions. 

## 2020-01-20 NOTE — Progress Notes (Signed)
CARDIAC REHAB PHASE I   PRE:  Rate/Rhythm: 73 SR  BP:  Sitting: 142/67      SaO2: 95 RA  MODE:  Ambulation: 240 ft   POST:  Rate/Rhythm: 95 SR  BP:  Sitting: 149/64    SaO2: 96 RA   Pt ambulated 256ft in hallway assist of one guiding front wheel walker. Pt states breathing is much improved, denies SOB, pain, or dizziness. Pt returned to bed, call bell and cell phone within reach. Encouraged continued ambulation with emphasis on safety. Reviewed site care and restrictions. Pt hopeful for d/c today.  [phone] Rufina Falco, RN BSN 01/20/2020 10:52 AM

## 2020-01-20 NOTE — Progress Notes (Signed)
  Echocardiogram 2D Echocardiogram has been performed.  Geoffery Lyons Swaim 01/20/2020, 9:24 AM

## 2020-01-20 NOTE — Discharge Summary (Addendum)
[facility] TEAM  Discharge Summary    Patient ID: Elijah White MRN: [id number]; DOB: 1940/06/26  Admit date: 01/19/2020 Discharge date: 01/20/2020  Primary Care Provider: [name], MD  Primary Cardiologist: Evalina Field, MD / Dr. Buena Irish & Dr. Cyndia Bent (TAVR)  Discharge Diagnoses    Principal Problem:   S/P TAVR (transcatheter aortic valve replacement) Active Problems:   Type 2 diabetes mellitus with renal manifestations (HCC)   BPH (benign prostatic hyperplasia)   Essential hypertension   GERD (gastroesophageal reflux disease)   HLD (hyperlipidemia)   ESRD (end stage renal disease) ([place])   Severe aortic stenosis   RBBB   Blindness   Acute on chronic diastolic heart failure (HCC)   Allergies No Known Allergies  Diagnostic Studies/Procedures    TAVR OPERATIVE NOTE Date of Procedure:                01/19/2020   Preoperative Diagnosis:      Severe Aortic Stenosis    Postoperative Diagnosis:    Same    Procedure:        Transcatheter Aortic Valve Replacement - Percutaneous Left Transfemoral Approach             Edwards Sapien 3 Ultra THV (size 26 mm, model # [id number], serial # [id number])              Co-Surgeons:                        Gaye Pollack, MD and Lauree Chandler, MD     Anesthesiologist:                  Suzette Battiest, MD   Echocardiographer:              Ena Dawley, MD   Pre-operative Echo Findings: Severe aortic stenosis Normal left ventricular systolic function   Post-operative Echo Findings: No paravalvular leak Normal left ventricular systolic function  _____________  Echo 01/20/20: complete but pending formal read at the time of discharge    History of Present Illness     Elijah White is a 79 y.o. male with a history of ESRD on HD (TTS), HTN, DMT2, legal blindness, anemia, RBBB, chronic diastolic CHF and severe AS who presented to [facility] on 01/19/20 for planned  TAVR.  He was placed on HD in 04/2019. He is followed by Dr. [name]. He was admitted to the hospital in 09/2017 for acute on chronic diastolic heart failure, but has never been followed by cardiology.   He was in his usual state of heath until 11/26/19 when he presented to [facility] via EMS with chest pain/epigastric pain and dypsnea while en route to hemodialysis. Upon arrival to ED: Patient's vital signs stable, afebrile, no leukocytosis, maintaining oxygen saturation on room air, HS troponin 36--> 37, EKG: sinus with RBBB and no acute changes, COVID-19 negative.  CT head negative for acute findings, lipase: WNL. Chest x-ray showed pulmonary vascular congestion without pulmonary edema and mild cardiomegaly. CTA chest/abd/pelvis: Negative for PE or aortic dissection. 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 A murmur was noted on exam prompting cardiology consultation and echo. He was cleared for colonoscopy which showed a polyp without evidence of malignancy. Echocardiogram  showed preserved LV function with moderate to severe AS: V-max of 3.7 m/s, mean gradient 26 mmHg, and aortic valve area ~1.0 cm. Dimensionless index around 0.31. Stroke-volume index reported at 44 cc/m.  He is being followed by Dr. Audie Box who felt his cardiac exam revealed a late peaking systoloic murmur more c/w severe AS. Given symptoms of chest pain and exertional dyspnea structural heart evaluation with cardiac CT scanning was requested for further work up. CT scans were c/w severe AS and plans were made for TAVR after dental extractions.   The patient has been evaluated by the multidisciplinary valve team and felt to have severe, symptomatic aortic stenosis and to be a suitable candidate for TAVR, which was set up for  01/19/20.  Hospital Course     Consultants: none   Severe AS: s/p successful TAVR with a 26 mm Edwards Sapien 3 Ultra THV via the TF approach on 01/19/20. Post operative echo completed but pending formal read. Groin sites are stable. ECG with IRBBB (old) and no high grade heart block. Continue Asprin and started on Plavix 75 mg daily. Plan for discharge home with his daughter, [name], today and follow up in the office in 2 weeks.   Acute on chronic diastolic CHF: as evidenced by an elevated BNP on pre admission lab work. This has been treated with TAVR. Volume management per HD.   ESRD on HD: he had an extra HD session on Monday. He will resume his same schedule, starting Thursday.   GERD: Prilosec was changed to Protonix given potential drug drug interaction with Plavix.   DMT2: treated with SSI while admitted. Resume home meds at discharge.   RBBB: there has been no evidence of HAVB by telemetry or ECG. Temporary pacing wire was left in overnight. Given underlying conduction disease and recent TAVR placement, he will be discharged with a Zio AT to rule out late presenting HAVB.   Pulmonary nodule: pre TAVR CT showed a tiny 3 mm solid left upper lobe pulmonary nodule. No follow-up needed if patient is low-risk. Non-contrast chest CT can be considered in 12 months if patient is high-risk. This will be discussed in the outpatient setting.    ___________  Discharge Vitals Blood pressure (!) 125/59, pulse (!) 50, temperature 99.1 F (37.3 C), temperature source Oral, resp. rate 14, height $RemoveBe'5\' 5"'utemDsbwY$  (1.651 m), weight 75.8 kg, SpO2 100 %.  Filed Weights   01/19/20 1029 01/20/20 0400  Weight: 77.4 kg 75.8 kg    Labs & Radiologic Studies    CBC Recent Labs    01/19/20 1537 01/20/20 0449  WBC  --  6.2  HGB 10.9* 10.7*  HCT 32.0* 33.5*  MCV  --  102.1*  PLT  --  287   Basic Metabolic Panel Recent Labs    01/19/20 1537 01/20/20 0449  NA 137 140  K 5.1 4.9  CL 100 103  CO2  --  25   GLUCOSE 142* 74  BUN 31* 34*  CREATININE 7.80* 8.19*  CALCIUM  --  9.4  MG  --  2.0   Liver Function Tests No results for input(s): AST, ALT, ALKPHOS, BILITOT, PROT, ALBUMIN in the last 72 hours. No results for input(s): LIPASE, AMYLASE in the last 72 hours. Cardiac Enzymes No results for input(s): CKTOTAL, CKMB, CKMBINDEX, TROPONINI in the last 72 hours. BNP Invalid input(s): POCBNP D-Dimer No results for input(s): DDIMER in the last 72 hours. Hemoglobin A1C No results for input(s): HGBA1C in the last 72 hours.  Fasting Lipid Panel No results for input(s): CHOL, HDL, LDLCALC, TRIG, CHOLHDL, LDLDIRECT in the last 72 hours. Thyroid Function Tests No results for input(s): TSH, T4TOTAL, T3FREE, THYROIDAB in the last 72 hours.  Invalid input(s): FREET3 _____________  DG Chest 2 View  Result Date: 01/14/2020 CLINICAL DATA:  79 year old male with a history of pending TAVR EXAM: CHEST - 2 VIEW COMPARISON:  12/13/2019 FINDINGS: Cardiomediastinal silhouette unchanged in size and contour. No pneumothorax or pleural effusion. No confluent airspace disease. No interlobular septal thickening. Degenerative changes of the spine. IMPRESSION: Negative for acute cardiopulmonary disease Electronically Signed   By: Corrie Mckusick D.O.   On: 01/14/2020 15:56   DG Chest Port 1 View  Result Date: 01/19/2020 CLINICAL DATA:  Status post TAVR EXAM: PORTABLE CHEST 1 VIEW COMPARISON:  01/14/2020 FINDINGS: TAVR stent noted. Right IJ Cordis and single right ventricular pacer wire noted. The heart is normal in size. The mediastinal and hilar contours are within normal limits. The lungs are clear. No pleural effusions or pulmonary edema. IMPRESSION: Status post TAVR.  No acute cardiopulmonary findings. Electronically Signed   By: Marijo Sanes M.D.   On: 01/19/2020 18:50   ECHOCARDIOGRAM LIMITED  Result Date: 01/19/2020    ECHOCARDIOGRAM LIMITED REPORT   Patient Name:   Elijah White Date of Exam: 01/19/2020  Medical Rec #:  [id number]       Height:       62.0 in Accession #:    [id number]      Weight:       170.7 lb Date of Birth:  Jun 02, 1940       BSA:          1.787 m Patient Age:    60 years        BP:           163/63 mmHg Patient Gender: M               HR:           65 bpm. Exam Location:  Inpatient Procedure: Limited Echo, Cardiac Doppler and Color Doppler Indications:     I35.0 Nonrheumatic aortic (valve) stenosis  History:         Patient has prior history of Echocardiogram examinations, most                  recent 11/28/2019. Abnormal ECG, Arrythmias:RBBB; Risk                  Factors:Diabetes, Hypertension and Dyslipidemia. ESRD. Severe                  aortic stenosis. TAVR procedure.  Sonographer:     Roseanna Rainbow RDCS Referring Phys:  [place] Diagnosing Phys: Ena Dawley MD IMPRESSIONS  1. Periprocedural TTE during a TAVR procedure, a 26 mm Edwards-SAPIEN 3 Ultra valve was successfully deployed in the aortic position with improvement of peak/mean transaortic gradients 68/45 mmHg to 8/3 mmHg. There was no paravalvular leak and no pericardial effusion post procedure. Dimensionless index improved from 0.25 to 0.74.  2. Left ventricular ejection fraction, by estimation, is 65 to 70%. The left ventricle has normal function. Left ventricular diastolic parameters are consistent with Grade I diastolic dysfunction (impaired relaxation).  3. Mild mitral valve regurgitation. Severe mitral annular calcification.  4. Aortic valve regurgitation is mild. Severe aortic valve stenosis. Aortic valve mean gradient measures 45.0 mmHg. FINDINGS  Left Ventricle: Left ventricular ejection fraction, by estimation, is 65 to  70%. The left ventricle has normal function. Left ventricular diastolic parameters are consistent with Grade I diastolic dysfunction (impaired relaxation). Mitral Valve: There is moderate thickening of the mitral valve leaflet(s). There is moderate calcification of the mitral valve  leaflet(s). Severe mitral annular calcification. Mild mitral valve regurgitation. Tricuspid Valve: Tricuspid valve regurgitation is mild. Aortic Valve: Aortic valve regurgitation is mild. Severe aortic stenosis is present. Aortic valve mean gradient measures 45.0 mmHg. Aortic valve peak gradient measures 39.6 mmHg. Aortic valve area, by VTI measures 0.85 cm. LEFT VENTRICLE PLAX 2D LVOT diam:     1.90 cm LV SV:         70 LV SV Index:   39 LVOT Area:     2.84 cm  LV Volumes (MOD) LV vol d, MOD A2C: 71.7 ml LV vol d, MOD A4C: 53.6 ml LV vol s, MOD A2C: 22.6 ml LV vol s, MOD A4C: 22.0 ml LV SV MOD A2C:     49.1 ml LV SV MOD A4C:     53.6 ml LV SV MOD BP:      39.2 ml AORTIC VALVE AV Area (Vmax):    0.88 cm AV Area (Vmean):   0.73 cm AV Area (VTI):     0.85 cm AV Vmax:           314.67 cm/s AV Vmean:          237.333 cm/s AV VTI:            0.825 m AV Peak Grad:      39.6 mmHg AV Mean Grad:      45.0 mmHg LVOT Vmax:         98.05 cm/s LVOT Vmean:        61.050 cm/s LVOT VTI:          0.248 m LVOT/AV VTI ratio: 0.30  SHUNTS Systemic VTI:  0.25 m Systemic Diam: 1.90 cm Ena Dawley MD Electronically signed by Ena Dawley MD Signature Date/Time: 01/19/2020/3:13:13 PM    Final    Structural Heart Procedure  Result Date: 01/19/2020 See surgical note for result.  Disposition   Pt is being discharged home today in good condition.  Follow-up Plans & Appointments     Follow-up Information     Eileen Stanford, PA-C. Go on 02/03/2020.   Specialties: Cardiology, Radiology Why: @ 3:30pm, please arrive at least 10 minutes early.  Contact information: [address] [place] [address] [phone]                   Discharge Medications   Allergies as of 01/20/2020   No Known Allergies      Medication List     STOP taking these medications    esomeprazole 40 MG capsule Commonly known as: [place] by: pantoprazole 40 MG tablet       TAKE these  medications    acetaminophen 500 MG tablet Commonly known as: TYLENOL Take 2 tablets (1,000 mg total) by mouth every 6 (six) hours as needed for mild pain, moderate pain or headache.   apraclonidine 0.5 % ophthalmic solution Commonly known as: IOPIDINE Place 1 drop into the right eye 3 (three) times daily.   atorvastatin 10 MG tablet Commonly known as: LIPITOR TAKE 1 TABLET BY MOUTH EVERY DAY   Auryxia 1 GM 210 MG(Fe) tablet Generic drug: ferric citrate Take 420 mg by mouth 3 (three) times daily with meals.   BD Pen Needle Nano U/F 32G X  4 MM Misc Generic drug: Insulin Pen Needle USE SUBCUTANEOUS FOUR TIMES A DAY   blood glucose meter kit and supplies Kit Dispense based on patient and insurance preference. Use up to four times daily as directed. (FOR ICD-9 250.00, 250.01). For QAC - HS accuchecks.   carvedilol 3.125 MG tablet Commonly known as: COREG TAKE 1 TABLET(3.125 MG) BY MOUTH TWICE DAILY WITH A MEAL What changed: See the new instructions.   chlorhexidine 0.12 % solution Commonly known as: Peridex Use as directed 15 mLs in the mouth or throat 2 (two) times daily for 14 days. Swish and spit 70m by mouth two times daily for 14 days.   clopidogrel 75 MG tablet Commonly known as: PLAVIX Take 1 tablet (75 mg total) by mouth daily with breakfast. Start taking on: January 21, 2020   Dialyvite 800 0.8 MG Tabs Take 0.8 mg by mouth daily.   diclofenac Sodium 1 % Gel Commonly known as: VOLTAREN Apply 4 g topically 4 (four) times daily as needed.   dorzolamide-timolol 22.3-6.8 MG/ML ophthalmic solution Commonly known as: COSOPT Place 1 drop into the right eye 2 (two) times daily.   gabapentin 100 MG capsule Commonly known as: NEURONTIN TAKE 1 CAPSULE(100 MG) BY MOUTH AT BEDTIME What changed: See the new instructions.   hydrALAZINE 25 MG tablet Commonly known as: APRESOLINE TAKE 1 TABLET(25 MG) BY MOUTH TWICE DAILY What changed: See the new instructions.     hydroxypropyl methylcellulose / hypromellose 2.5 % ophthalmic solution Commonly known as: ISOPTO TEARS / GONIOVISC Place 1 drop into the left eye as needed for dry eyes.   isosorbide mononitrate 30 MG 24 hr tablet Commonly known as: IMDUR TAKE 1 TABLET(30 MG) BY MOUTH DAILY What changed: See the new instructions.   latanoprost 0.005 % ophthalmic solution Commonly known as: XALATAN Place 1 drop into the right eye at bedtime.   OneTouch Delica Plus [id number] USE AS DIRECTED UP TO FOUR TIMES A DAY   OneTouch Verio test strip Generic drug: glucose blood USE AS DIRECTED TO TEST FOUR TIMES A DAY   pantoprazole 40 MG tablet Commonly known as: PROTONIX Take 1 tablet (40 mg total) by mouth daily at 12 noon. Start taking on: January 21, 2020 Replaces: esomeprazole 40 MG capsule   pilocarpine 4 % ophthalmic solution Commonly known as: PILOCAR Place 1 drop into the right eye 4 (four) times daily.   tamsulosin 0.4 MG Caps capsule Commonly known as: FLOMAX Take 1 capsule (0.4 mg total) by mouth at bedtime.            Outstanding Labs/Studies   None   Duration of Discharge Encounter   Greater than 30 minutes including physician time.  Signed, KAngelena Form PA-C 01/20/2020, 12:58 PM [phone] I have personally seen and examined this patient. I agree with the assessment and plan as outlined above.  He is doing well today post TAVR. Groins stable. BP stable. No heart block overnight. Temp wire out today. Echo with normally functioning AVR. Discharge home today.   CLauree Chandler11/17/2021 1:25 PM

## 2020-01-21 ENCOUNTER — Other Ambulatory Visit: Payer: Self-pay | Admitting: Nurse Practitioner

## 2020-01-21 ENCOUNTER — Telehealth: Payer: Self-pay

## 2020-01-21 DIAGNOSIS — N186 End stage renal disease: Secondary | ICD-10-CM | POA: Diagnosis not present

## 2020-01-21 DIAGNOSIS — Z952 Presence of prosthetic heart valve: Secondary | ICD-10-CM | POA: Diagnosis not present

## 2020-01-21 DIAGNOSIS — D631 Anemia in chronic kidney disease: Secondary | ICD-10-CM | POA: Diagnosis not present

## 2020-01-21 DIAGNOSIS — N2581 Secondary hyperparathyroidism of renal origin: Secondary | ICD-10-CM | POA: Diagnosis not present

## 2020-01-21 DIAGNOSIS — D509 Iron deficiency anemia, unspecified: Secondary | ICD-10-CM | POA: Diagnosis not present

## 2020-01-21 DIAGNOSIS — R52 Pain, unspecified: Secondary | ICD-10-CM | POA: Diagnosis not present

## 2020-01-21 DIAGNOSIS — Z992 Dependence on renal dialysis: Secondary | ICD-10-CM | POA: Diagnosis not present

## 2020-01-21 LAB — ECHOCARDIOGRAM COMPLETE
AR max vel: 3.13 cm2
AV Area VTI: 3.74 cm2
AV Area mean vel: 3.67 cm2
AV Mean grad: 8.5 mmHg
AV Peak grad: 20.7 mmHg
Ao pk vel: 2.28 m/s
Area-P 1/2: 2.95 cm2
Calc EF: 59.2 %
Height: 65 in
S' Lateral: 2.7 cm
Single Plane A2C EF: 63.6 %
Single Plane A4C EF: 57.8 %
Weight: 2673.74 oz

## 2020-01-21 NOTE — Telephone Encounter (Signed)
Patient's daughter, [name], contacted regarding discharge from [facility] on 01/20/2020.  Patient understands to follow up with provider Katie Thompson's on 02/03/2020 at 3:30 PM at [facility] office. Patient understands discharge instructions? yes Patient understands medications and regiment? yes Patient understands to bring all medications to this visit? Yes  Per [name] the pt's original Living Will and DNR were not returned to the pt at time of discharge.  I have reached out to medical records to see if these records can be located.

## 2020-01-21 NOTE — Anesthesia Postprocedure Evaluation (Signed)
Anesthesia Post Note  Patient: Elijah White  Procedure(s) Performed: TRANSCATHETER AORTIC VALVE REPLACEMENT, LEFT TRANSFEMORAL (Left ) TRANSESOPHAGEAL ECHOCARDIOGRAM (TEE) (N/A )     Patient location during evaluation: PACU Anesthesia Type: MAC Level of consciousness: awake and alert Pain management: pain level controlled Vital Signs Assessment: post-procedure vital signs reviewed and stable Respiratory status: spontaneous breathing, nonlabored ventilation, respiratory function stable and patient connected to nasal cannula oxygen Cardiovascular status: stable and blood pressure returned to baseline Postop Assessment: no apparent nausea or vomiting Anesthetic complications: no   No complications documented.  Last Vitals:  Vitals:   01/20/20 0800 01/20/20 1112  BP: 138/69 (!) 125/59  Pulse:    Resp: 11 14  Temp: 36.8 C 37.3 C  SpO2: 99% 100%    Last Pain:  Vitals:   01/20/20 1112  TempSrc: Oral  PainSc: 0-No pain   Pain Goal: Patients Stated Pain Goal: 0 (01/20/20 0400)                 Tiajuana Amass

## 2020-01-23 DIAGNOSIS — N2581 Secondary hyperparathyroidism of renal origin: Secondary | ICD-10-CM | POA: Diagnosis not present

## 2020-01-23 DIAGNOSIS — Z992 Dependence on renal dialysis: Secondary | ICD-10-CM | POA: Diagnosis not present

## 2020-01-23 DIAGNOSIS — D631 Anemia in chronic kidney disease: Secondary | ICD-10-CM | POA: Diagnosis not present

## 2020-01-23 DIAGNOSIS — D509 Iron deficiency anemia, unspecified: Secondary | ICD-10-CM | POA: Diagnosis not present

## 2020-01-23 DIAGNOSIS — N186 End stage renal disease: Secondary | ICD-10-CM | POA: Diagnosis not present

## 2020-01-23 DIAGNOSIS — R52 Pain, unspecified: Secondary | ICD-10-CM | POA: Diagnosis not present

## 2020-01-25 DIAGNOSIS — N186 End stage renal disease: Secondary | ICD-10-CM | POA: Diagnosis not present

## 2020-01-25 DIAGNOSIS — D631 Anemia in chronic kidney disease: Secondary | ICD-10-CM | POA: Diagnosis not present

## 2020-01-25 DIAGNOSIS — N2581 Secondary hyperparathyroidism of renal origin: Secondary | ICD-10-CM | POA: Diagnosis not present

## 2020-01-25 DIAGNOSIS — R52 Pain, unspecified: Secondary | ICD-10-CM | POA: Diagnosis not present

## 2020-01-25 DIAGNOSIS — D509 Iron deficiency anemia, unspecified: Secondary | ICD-10-CM | POA: Diagnosis not present

## 2020-01-25 DIAGNOSIS — Z992 Dependence on renal dialysis: Secondary | ICD-10-CM | POA: Diagnosis not present

## 2020-01-27 ENCOUNTER — Telehealth: Payer: Medicare Other

## 2020-01-27 DIAGNOSIS — D509 Iron deficiency anemia, unspecified: Secondary | ICD-10-CM | POA: Diagnosis not present

## 2020-01-27 DIAGNOSIS — Z992 Dependence on renal dialysis: Secondary | ICD-10-CM | POA: Diagnosis not present

## 2020-01-27 DIAGNOSIS — N2581 Secondary hyperparathyroidism of renal origin: Secondary | ICD-10-CM | POA: Diagnosis not present

## 2020-01-27 DIAGNOSIS — N186 End stage renal disease: Secondary | ICD-10-CM | POA: Diagnosis not present

## 2020-01-27 DIAGNOSIS — R52 Pain, unspecified: Secondary | ICD-10-CM | POA: Diagnosis not present

## 2020-01-27 DIAGNOSIS — D631 Anemia in chronic kidney disease: Secondary | ICD-10-CM | POA: Diagnosis not present

## 2020-01-30 DIAGNOSIS — D631 Anemia in chronic kidney disease: Secondary | ICD-10-CM | POA: Diagnosis not present

## 2020-01-30 DIAGNOSIS — R52 Pain, unspecified: Secondary | ICD-10-CM | POA: Diagnosis not present

## 2020-01-30 DIAGNOSIS — D509 Iron deficiency anemia, unspecified: Secondary | ICD-10-CM | POA: Diagnosis not present

## 2020-01-30 DIAGNOSIS — Z992 Dependence on renal dialysis: Secondary | ICD-10-CM | POA: Diagnosis not present

## 2020-01-30 DIAGNOSIS — N2581 Secondary hyperparathyroidism of renal origin: Secondary | ICD-10-CM | POA: Diagnosis not present

## 2020-01-30 DIAGNOSIS — N186 End stage renal disease: Secondary | ICD-10-CM | POA: Diagnosis not present

## 2020-02-01 DIAGNOSIS — H401133 Primary open-angle glaucoma, bilateral, severe stage: Secondary | ICD-10-CM | POA: Diagnosis not present

## 2020-02-02 DIAGNOSIS — D509 Iron deficiency anemia, unspecified: Secondary | ICD-10-CM | POA: Diagnosis not present

## 2020-02-02 DIAGNOSIS — N2581 Secondary hyperparathyroidism of renal origin: Secondary | ICD-10-CM | POA: Diagnosis not present

## 2020-02-02 DIAGNOSIS — N186 End stage renal disease: Secondary | ICD-10-CM | POA: Diagnosis not present

## 2020-02-02 DIAGNOSIS — R52 Pain, unspecified: Secondary | ICD-10-CM | POA: Diagnosis not present

## 2020-02-02 DIAGNOSIS — I129 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 kidney disease, or unspecified chronic kidney disease: Secondary | ICD-10-CM | POA: Diagnosis not present

## 2020-02-02 DIAGNOSIS — Z992 Dependence on renal dialysis: Secondary | ICD-10-CM | POA: Diagnosis not present

## 2020-02-02 DIAGNOSIS — D631 Anemia in chronic kidney disease: Secondary | ICD-10-CM | POA: Diagnosis not present

## 2020-02-02 NOTE — Progress Notes (Signed)
[facility] [place]                                       Cardiology Office Note    Date:  02/03/2020   ID:  Elijah White, DOB 05/26/1940, MRN [id number]  PCP:  Elijah Chard, MD  Cardiologist:  Elijah Field, MD / Dr. Buena White & Dr. Cyndia White (TAVR)   CC: Elijah White s/p TAVR  History of Present Illness:  Elijah White is a 79 y.o. 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   He was placed on HD in2/2021. He is followed by Elijah White.He was admitted to the hospital in 09/2017 for acute on chronic diastolic heart failure,but has never been followed by cardiology.  He was in his usual state of heath until 11/26/19 when he presented to [facility] via EMS with chest pain/epigastric pain and dypsnea while en route to hemodialysis.Upon arrival to ED: Patient's vital signs stable, afebrile, no leukocytosis, maintaining oxygen saturation on room air,HStroponin 36--> 37, WUJ:WJXBJ with RBBB and no acute changes, COVID-19 negative. CT head negative for acute findings, lipase: WNL. Chest x-ray showedpulmonary vascular congestion without pulmonary edema andmild cardiomegaly. CTA chest/abd/pelvis: Negative for PE or aorticdissection. However, there was a2.3 cm enhancing mass within the lumen of the mid transverse colon, concerning for neoplasm and markedly distended gallbladder with numerous small calcified gallstones layering dependentlyas well as a2.7 cm indeterminate left adrenal mass. A murmur was noted on exam prompting cardiology consultation and echo. He was cleared for colonoscopy which showed a polyp without evidence of malignancy. Echocardiogram showed preserved LV function with moderate to severe AS:V-max of 3.67ms, mean gradient 26 mmHg,and aortic valve area~1.0cm.Dimensionless index around 0.31. Stroke-volume  index reported at 44 cc/m.He was being followed by Dr. OAudie Boxwho felt his cardiac exam revealed a late peaking systoloic murmur more c/w severe AS.Given symptoms of chest pain and exertional dyspnea structural heart evaluation with cardiac CT scanning was requested for further work up.CT scans were c/w severe AS and plans were made for TAVR after dental extractions.   The patient was evaluated by the multidisciplinary valve team and underwent successful TAVR with a 26 mm Edwards Sapien 3 Ultra THV via the TF approach on 01/19/20. Post operative echo showed EF 60%, normally functioning TAVR with a mean gradient of 4 mmHg and no PVL. He was started on Plavix 742mdaily to be continued for 6 months. He was discharged with a Zio AT given underlying RBBB.   Today he presents to clinic for follow up. Here with his daughter, Elijah HousemanNo CP or SOB. No LE edema, orthopnea or PND. No dizziness or syncope. No blood in stool or urine. No palpitations. Tolerating HD much better since TAVR.    Past Medical History:  Diagnosis Date  . Anemia    low iron  . Arthritis   . [facility]   prostate  . [facility]ps ~ 1993 and [facility]  [facility] colonoscopy: tubular adenoma at cecum.    . 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 Elevated cholesterol with high triglycerides   . [place] stage [place] disease) [place]  .  GERD (gastroesophageal reflux disease)   . Hypertension   . Legally blind in left eye, as defined in [place]    has pinpoint vision in right eye  . RBBB     Past Surgical History:  Procedure Laterality Date  . AV FISTULA PLACEMENT Left 03/20/2018   Procedure: ARTERIOVENOUS (AV) FISTULA CREATION ARM;  Surgeon: Elijah Sandy, MD;  Location: [place];  Service: Vascular;  Laterality: Left;  . BASCILIC VEIN TRANSPOSITION Left 05/21/2018   Procedure: LEFT BASILIC VEIN FISTULA SECOND STAGE;  Surgeon: Elijah Sandy, MD;  Location: [place];  Service: Vascular;  Laterality: Left;  . COLONOSCOPY    . COLONOSCOPY WITH PROPOFOL N/A 11/30/2019   Procedure: COLONOSCOPY WITH PROPOFOL;  Surgeon: Elijah Juniper, MD;  Location: [place];  Service: Gastroenterology;  Laterality: N/A;  . GLAUCOMA SURGERY  2019   multiple surgeries  . HEMOSTASIS CLIP PLACEMENT  11/30/2019   Procedure: HEMOSTASIS CLIP PLACEMENT;  Surgeon: Elijah Juniper, MD;  Location: [place];  Service: Gastroenterology;;  . KNEE ARTHROSCOPY    . MULTIPLE EXTRACTIONS WITH ALVEOLOPLASTY N/A 12/30/2019   Procedure: MULTIPLE EXTRACTION WITH ALVEOLOPLASTY;  Surgeon: Elijah White, DMD;  Location: [place];  Service: Dentistry;  Laterality: N/A;  . POLYPECTOMY  11/30/2019   Procedure: POLYPECTOMY;  Surgeon: Elijah Juniper, MD;  Location: [place];  Service: Gastroenterology;;  . PROSTATE SURGERY    . RIGHT/LEFT HEART CATH AND CORONARY ANGIOGRAPHY N/A 12/03/2019   Procedure: RIGHT/LEFT HEART CATH AND CORONARY ANGIOGRAPHY;  Surgeon: Elijah Blanks, MD;  Location: [place] CV LAB;  Service: Cardiovascular;  Laterality: N/A;  . SUBMUCOSAL TATTOO INJECTION  11/30/2019   Procedure: SUBMUCOSAL TATTOO INJECTION;  Surgeon: Elijah Juniper, MD;  Location: [facility] ENDOSCOPY;  Service: Gastroenterology;;  . TEE WITHOUT CARDIOVERSION N/A 01/19/2020   Procedure: TRANSESOPHAGEAL ECHOCARDIOGRAM (TEE);  Surgeon: Elijah Blanks, MD;  Location: [place] CV LAB;  Service: Open Heart Surgery;  Laterality: N/A;  . TRANSCATHETER AORTIC VALVE REPLACEMENT, TRANSFEMORAL Left 01/19/2020   Procedure: TRANSCATHETER AORTIC VALVE REPLACEMENT, LEFT TRANSFEMORAL;  Surgeon: Elijah Blanks, MD;  Location: [place] CV LAB;  Service: Open Heart Surgery;  Laterality: Left;    Current Medications: Outpatient Medications Prior to Visit  Medication Sig Dispense Refill  . acetaminophen (TYLENOL) 500 MG tablet Take 2 tablets (1,000 mg total) by mouth  every 6 (six) hours as needed for mild pain, moderate pain or headache. 60 tablet 0  . apraclonidine (IOPIDINE) 0.5 % ophthalmic solution Place 1 drop into the right eye 3 (three) times daily.     [name] atorvastatin (LIPITOR) 10 MG tablet TAKE 1 TABLET BY MOUTH EVERY DAY 90 tablet 0  . B Complex-C-Folic Acid (DIALYVITE 854) 0.8 MG TABS Take 0.8 mg by mouth daily.     . blood glucose meter kit and supplies KIT Dispense based on patient and insurance preference. Use up to four times daily as directed. (FOR ICD-9 250.00, 250.01). For QAC - HS accuchecks. 1 each 1  . carvedilol (COREG) 3.125 MG tablet TAKE 1 TABLET(3.125 MG) BY MOUTH TWICE DAILY WITH A MEAL 180 tablet 1  . clopidogrel (PLAVIX) 75 MG tablet Take 1 tablet (75 mg total) by mouth daily with breakfast. 90 tablet 1  . diclofenac Sodium (VOLTAREN) 1 % GEL Apply 4 g topically 4 (four) times daily as needed. 100 g 0  . dorzolamide-timolol (COSOPT) 22.3-6.8 MG/ML ophthalmic solution Place 1 drop into the right eye 2 (two) times daily.  11  . ferric  citrate (AURYXIA) 1 GM 210 MG(Fe) tablet Take 420 mg by mouth 3 (three) times daily with meals.    . gabapentin (NEURONTIN) 100 MG capsule Take 1 capsule (100 mg total) by mouth at bedtime. 90 capsule 1  . hydrALAZINE (APRESOLINE) 25 MG tablet TAKE 1 TABLET(25 MG) BY MOUTH TWICE DAILY 180 tablet 1  . hydroxypropyl methylcellulose / hypromellose (ISOPTO TEARS / GONIOVISC) 2.5 % ophthalmic solution Place 1 drop into the left eye as needed for dry eyes.    . isosorbide mononitrate (IMDUR) 30 MG 24 hr tablet TAKE 1 TABLET(30 MG) BY MOUTH DAILY 90 tablet 1  . Lancets (ONETOUCH DELICA PLUS [id number]) MISC USE AS DIRECTED UP TO FOUR TIMES A DAY    . latanoprost (XALATAN) 0.005 % ophthalmic solution Place 1 drop into the right eye at bedtime.    [name] test strip USE AS DIRECTED TO TEST FOUR TIMES A DAY 100 each 3  . pantoprazole (PROTONIX) 40 MG tablet Take 1 tablet (40 mg total) by mouth daily at 12  noon. 90 tablet 1  . pilocarpine (PILOCAR) 4 % ophthalmic solution Place 1 drop into the right eye 4 (four) times daily.     . BD PEN NEEDLE NANO U/F 32G X 4 MM MISC USE SUBCUTANEOUS FOUR TIMES A DAY    . tamsulosin (FLOMAX) 0.4 MG CAPS capsule Take 1 capsule (0.4 mg total) by mouth at bedtime. 30 capsule 0   No facility-administered medications prior to visit.     Allergies:   Patient has no known allergies.   Social History   Socioeconomic History  . Marital status: Widowed    Spouse name: Not on file  . Number of children: Not on file  . Years of education: Not on file  . Highest education level: Not on file  Occupational History  . Occupation: retired  Tobacco Use  . Smoking status: Former Smoker    Packs/day: 0.50    Quit date: 03/23/2018    Years since quitting: 1.8  . Smokeless tobacco: Never Used  Vaping Use  . Vaping Use: Never used  Substance and Sexual Activity  . Alcohol use: No  . Drug use: No  . Sexual activity: Not Currently  Other Topics Concern  . Not on file  Social History Narrative  . Not on file   Social Determinants of Health   Financial Resource Strain: Low Risk   . Difficulty of Paying Living Expenses: Not hard at all  Food Insecurity: No Food Insecurity  . Worried About Charity fundraiser in the Last Year: Never true  . Ran Out of Food in the Last Year: Never true  Transportation Needs: No Transportation Needs  . Lack of Transportation (Medical): No  . Lack of Transportation (Non-Medical): No  Physical Activity: Inactive  . Days of Exercise per Week: 0 days  . Minutes of Exercise per Session: 0 min  Stress: No Stress Concern Present  . Feeling of Stress : Not at all  Social Connections:   . Frequency of Communication with Friends and Family: Not on file  . Frequency of Social Gatherings with Friends and Family: Not on file  . Attends Religious Services: Not on file  . Active Member of Clubs or Organizations: Not on file  . Attends English as a second language teacher Meetings: Not on file  . Marital Status: Not on file     Family History:  The patient's family history includes CAD in his mother.  ROS:   Please see the history of present illness.    ROS All other systems reviewed and are negative.   PHYSICAL EXAM:   VS:  BP (!) 138/52   Pulse 82   Ht _0  (1.626 m)   Wt 168 lb (76.2 kg)   SpO2 98%   BMI 28.84 kg/m    GEN: Well nourished, well developed, in no acute distress HEENT: normal Neck: no JVD or masses Cardiac: RRR; no murmurs, rubs, or gallops,no edema  Respiratory:  clear to auscultation bilaterally, normal work of breathing GI: soft, nontender, nondistended, + BS MS: no deformity or atrophy Skin: warm and dry, no rash.  Groin sites clear without hematoma or ecchymosis  Neuro:  Alert and Oriented x 3, Strength and sensation are intact Psych: euthymic mood, full affect   Wt Readings from Last 3 Encounters:  02/03/20 168 lb (76.2 kg)  01/20/20 167 lb 1.7 oz (75.8 kg)  01/14/20 170 lb 11.2 oz (77.4 kg)      Studies/Labs Reviewed:   EKG:  EKG is ordered today.  The ekg ordered today demonstrates sinus with IRBBB, HR 81  Recent Labs: 11/26/2019: TSH 0.820 01/14/2020: ALT 10; B Natriuretic Peptide 335.4 01/20/2020: BUN 34; Creatinine, Ser 8.19; Hemoglobin 10.7; Magnesium 2.0; Platelets 156; Potassium 4.9; Sodium 140   Lipid Panel    Component Value Date/Time   LDLCALC 103 09/11/2019 0000    Additional studies/ records that were reviewed today include:  TAVR OPERATIVE NOTE Date of Procedure:01/19/2020  Preoperative Diagnosis:Severe Aortic Stenosis   Postoperative Diagnosis:Same   Procedure:   Transcatheter Aortic Valve Replacement - PercutaneousLeftTransfemoral Approach Edwards Sapien 3 Ultra THV (size 48m, model # 9750TFX, serial # [id number]  Co-Surgeons:Bryan KAlveria Apley MD and CLauree Chandler  MD   Anesthesiologist:Robert FOla Spurr MD  EDala Dock MD  Pre-operative Echo Findings: ? Severe aortic stenosis ? Normalleft ventricular systolic function  Post-operative Echo Findings: ? Noparavalvular leak ? Normalleft ventricular systolic function  _____________  Echo 01/20/20:  IMPRESSIONS  1. Left ventricular ejection fraction, by estimation, is 60 to 65%. The  left ventricle has normal function. The left ventricle has no regional  wall motion abnormalities. Left ventricular diastolic parameters were  normal.  2. Right ventricular systolic function is normal. The right ventricular  size is normal.  3. The mitral valve is degenerative. Trivial mitral valve regurgitation.  No evidence of mitral stenosis. Severe mitral annular calcification.  4. Post TAVR 11/16 with 26 mm Sapien 3 Ultra valve. No PVL. Tracings by  tech seem contaminated with high peak gradient measured. I measure mean  gradient closer to 4 mmHg and peak 8 mmHg see image 44 regarding signals  DVI 0.7 and AVA over 3 cm2. The  aortic valve has been repaired/replaced. Aortic valve regurgitation is not  visualized. No aortic stenosis is present. There is a 26 mm Sapien  prosthetic (TAVR) valve present in the aortic position. Procedure Date:  01/19/2020.  5. The inferior vena cava is normal in size with greater than 50%  respiratory variability, suggesting right atrial pressure of 3 mmHg.  ASSESSMENT & PLAN:   Severe AS s/p TAVR: doing great. Groin sites are healing well. SBE prophylaxis discussed; I have RX'd amoxicillin. Continue aspirin and plavix. I will see him back at the end of the month for echo and follow up.   ESRD on HD:  tolerating HD better since TAVR.  RBBB: ECG stable today. Sent Zio patch back to company today.   Pulmonary nodule:  pre TAVR CT showed a tiny 3 mm solid left upper lobe pulmonary nodule. No follow-up  needed if patient is low-risk. Non-contrast chest CT can be considered in 12 months if patient is high-risk. This was not discussed today.     Medication Adjustments/Labs and Tests Ordered: Current medicines are reviewed at length with the patient today.  Concerns regarding medicines are outlined above.  Medication changes, Labs and Tests ordered today are listed in the Patient Instructions below. Patient Instructions  Medication Instructions:  Your physician has recommended you make the following change in your medication:  1.) amoxicillin 500 mg tablets --take 4 tablets (2000 mg) one hour prior to any dental procedures  *If you need a refill on your cardiac medications before your next appointment, please call your pharmacy*   Lab Work: none If you have labs (blood work) drawn today and your tests are completely normal, you will receive your results only by: [name] MyChart Message (if you have MyChart) OR . A paper copy in the mail If you have any lab test that is abnormal or we need to change your treatment, we will call you to review the results.   Testing/Procedures: none   Follow-Up: As scheduled  Other Instructions      Signed, [name], PA-C  02/03/2020 9:40 PM    [facility] [address], [place], [place]  [address] Phone: [phone]; Fax: [phone]

## 2020-02-03 ENCOUNTER — Other Ambulatory Visit: Payer: Self-pay

## 2020-02-03 ENCOUNTER — Ambulatory Visit: Payer: Medicare Other | Admitting: Physician Assistant

## 2020-02-03 ENCOUNTER — Encounter: Payer: Self-pay | Admitting: Physician Assistant

## 2020-02-03 VITALS — BP 138/52 | HR 82 | Ht 64.0 in | Wt 168.0 lb

## 2020-02-03 DIAGNOSIS — Z952 Presence of prosthetic heart valve: Secondary | ICD-10-CM | POA: Diagnosis not present

## 2020-02-03 DIAGNOSIS — I4510 Unspecified right bundle-branch block: Secondary | ICD-10-CM | POA: Diagnosis not present

## 2020-02-03 DIAGNOSIS — N186 End stage renal disease: Secondary | ICD-10-CM

## 2020-02-03 DIAGNOSIS — R911 Solitary pulmonary nodule: Secondary | ICD-10-CM | POA: Diagnosis not present

## 2020-02-03 MED ORDER — AMOXICILLIN 500 MG PO TABS: ORAL_TABLET | ORAL | 1 refills | Status: DC

## 2020-02-03 NOTE — Patient Instructions (Signed)
Medication Instructions:  Your physician has recommended you make the following change in your medication:  1.) amoxicillin 500 mg tablets --take 4 tablets (2000 mg) one hour prior to any dental procedures  *If you need a refill on your cardiac medications before your next appointment, please call your pharmacy*   Lab Work: none If you have labs (blood work) drawn today and your tests are completely normal, you will receive your results only by: [name] MyChart Message (if you have MyChart) OR . A paper copy in the mail If you have any lab test that is abnormal or we need to change your treatment, we will call you to review the results.   Testing/Procedures: none   Follow-Up: As scheduled  Other Instructions

## 2020-02-04 ENCOUNTER — Ambulatory Visit: Payer: Medicare Other

## 2020-02-04 DIAGNOSIS — Z992 Dependence on renal dialysis: Secondary | ICD-10-CM | POA: Diagnosis not present

## 2020-02-04 DIAGNOSIS — N186 End stage renal disease: Secondary | ICD-10-CM

## 2020-02-04 DIAGNOSIS — N2581 Secondary hyperparathyroidism of renal origin: Secondary | ICD-10-CM | POA: Diagnosis not present

## 2020-02-04 DIAGNOSIS — D509 Iron deficiency anemia, unspecified: Secondary | ICD-10-CM | POA: Diagnosis not present

## 2020-02-04 DIAGNOSIS — E1122 Type 2 diabetes mellitus with diabetic chronic kidney disease: Secondary | ICD-10-CM

## 2020-02-04 DIAGNOSIS — D631 Anemia in chronic kidney disease: Secondary | ICD-10-CM | POA: Diagnosis not present

## 2020-02-04 NOTE — Chronic Care Management (AMB) (Signed)
Chronic Care Management    Social Work Follow Up Note  02/04/2020 Name: Elijah White MRN: [id number] DOB: 24-Jan-1941  Elijah White is a 79 y.o. year old male who is a primary care patient of [name], MD. The CCM team was consulted for assistance with care coordination.   Review of patient status, including review of consultants reports, other relevant assessments, and collaboration with appropriate care team members and the patient's provider was performed as part of comprehensive patient evaluation and provision of chronic care management services.    SDOH (Social Determinants of Health) assessments performed: No    Outpatient Encounter Medications as of 02/04/2020  Medication Sig   acetaminophen (TYLENOL) 500 MG tablet Take 2 tablets (1,000 mg total) by mouth every 6 (six) hours as needed for mild pain, moderate pain or headache.   amoxicillin (AMOXIL) 500 MG tablet Take 2000 mg (4 tablets) by mouth ONE HOUR BEFORE ANY DENTAL PROCEDURES   apraclonidine (IOPIDINE) 0.5 % ophthalmic solution Place 1 drop into the right eye 3 (three) times daily.    atorvastatin (LIPITOR) 10 MG tablet TAKE 1 TABLET BY MOUTH EVERY DAY   B Complex-C-Folic Acid (DIALYVITE 240) 0.8 MG TABS Take 0.8 mg by mouth daily.    blood glucose meter kit and supplies KIT Dispense based on patient and insurance preference. Use up to four times daily as directed. (FOR ICD-9 250.00, 250.01). For QAC - HS accuchecks.   carvedilol (COREG) 3.125 MG tablet TAKE 1 TABLET(3.125 MG) BY MOUTH TWICE DAILY WITH A MEAL   clopidogrel (PLAVIX) 75 MG tablet Take 1 tablet (75 mg total) by mouth daily with breakfast.   diclofenac Sodium (VOLTAREN) 1 % GEL Apply 4 g topically 4 (four) times daily as needed.   dorzolamide-timolol (COSOPT) 22.3-6.8 MG/ML ophthalmic solution Place 1 drop into the right eye 2 (two) times daily.   ferric citrate (AURYXIA) 1 GM 210 MG(Fe) tablet Take 420 mg by mouth 3 (three) times daily with  meals.   gabapentin (NEURONTIN) 100 MG capsule Take 1 capsule (100 mg total) by mouth at bedtime.   hydrALAZINE (APRESOLINE) 25 MG tablet TAKE 1 TABLET(25 MG) BY MOUTH TWICE DAILY   hydroxypropyl methylcellulose / hypromellose (ISOPTO TEARS / GONIOVISC) 2.5 % ophthalmic solution Place 1 drop into the left eye as needed for dry eyes.   isosorbide mononitrate (IMDUR) 30 MG 24 hr tablet TAKE 1 TABLET(30 MG) BY MOUTH DAILY   Lancets (ONETOUCH DELICA PLUS [id number]) MISC USE AS DIRECTED UP TO FOUR TIMES A DAY   latanoprost (XALATAN) 0.005 % ophthalmic solution Place 1 drop into the right eye at bedtime.   ONETOUCH VERIO test strip USE AS DIRECTED TO TEST FOUR TIMES A DAY   pantoprazole (PROTONIX) 40 MG tablet Take 1 tablet (40 mg total) by mouth daily at 12 noon.   pilocarpine (PILOCAR) 4 % ophthalmic solution Place 1 drop into the right eye 4 (four) times daily.    No facility-administered encounter medications on file as of 02/04/2020.     Goals Addressed            This Visit's Progress    Collaborate with RN Care Manager to assist with care coordination needs post inpatient stay       CARE PLAN ENTRY (see longitudinal plan of care for additional care plan information)  Current Barriers:   Level of care concerns  Social Isolation  Recent inpatient stay from 9/23- 9/30  Chronic conditions including ESRD and DM II which  put patient at increased risk for hospitalization  Social Work Clinical Goal(s):   Over the next 90 days the patient and his daughter will work with SW to address care coordination needs  CCM SW Interventions: Completed 02/04/20 with [name]  Successful outbound call placed to the patients daughter/caregiver [name] to assess goal progression  Determined the patient continues to do well in the home  [name] does report the patient did allow a visit from [facility]  No acute SW needs identified during this call  Scheduled follow up call  over the next 90 days  Encouraged Mrs. Foster to contact SW as needed prior to next scheduled call  Completed 12/23/19 with [name]  Inbound call received from patients daughter [name]  Determined the patient has refused home health services at this time due to worry over increasing COVID exposure in the home  Discussed [name] was contacted by [place] spoke with Remote RN who indicated patient was already active with service prior to new referral placed recently by Dr. Baird Cancer o Rosamaria Lints SW would inform Dr. Baird Cancer the patient is not interested in working with [facility] due to concern of increasing COVID exposure in the home o Informed by [name] she would like to keep the service at this time in hopes her father will change his mind   Determined the patient has exhibited increased agitation toward [name] and her cousin who is assisting with patient care approximately 3 days/week o Discussed patients decreased independence and worsening eyesight may be contributing to increase in agitation o Encouraged [name] to contact SW as needed for resource needs o Praised [name] for care she is providing the patient at this time to allow him to remain safely in his home  Discussed plans for upcoming procedures o Patient will have teeth extractions performed over the next week o Planned valve replacement for November 9th  Scheduled follow up call to [facility] over the next month to assist with ongoing care coordination needs  Completed 12/15/19 with [name]  Successful outbound call placed to the patients daughter in response to a voice message requesting SW assistance  Discussed patient discharged from the hospital this morning in order to attend HD treatment o The patient was at [facility] from 10/10 - 10/12 due to near syncope and ESRD  Determined the patient has not yet received home health services and declined a referral during discharge due to a past referral being  placed  Performed chart review to note a referral was placed by the patients primary provider on 10/5 o Discussed barriers to start of care including staff challenges due to [place] 19 pandemic as well as recent inpatient stay o Rosamaria Lints the referral coordinator is actively working this referral  Discussed [name]'s concern surrounding increased exposure risk in the home to [place] 19 by allowing home health in the home o Rosamaria Lints all home health agencies are following COVID protocols and using appropriate PPE o Determined [name] plans to review COVID protocol telephonically prior to consenting to services  Collaboration with RN Care Manager to inform of patient disposition and plan  Collaboration with Dr. Baird Cancer to review [name] concern with allowing home health services in the home as well as to discuss if the patient may benefit from [facility] o Determined Dr Baird Cancer agrees with [facility] referral  Completed 12/07/19 with the patient and his daughter Marianna Payment care team collaboration (see longitudinal plan of care)  Successful outbound call  placed to the patients daughter and caregiver [name] to assess for post-discharge needs  Determined the patient did receive a call over the weekend from [facility] to complete a TOC call o During this call the patient did agree to home health services o [name] reports she is unclear if home health had been ordered or to which agency  Performed chart review to note PA documented she would inform inpatient SW and PCP of patient decision to participate in home health  Collaboration with Dr. Baird Cancer to determine if she would be the ordering provider  Joint call completed with [name] and the patient to assess for care coordination needs o Discussed the patient is currently receiving MQB Medicaid and is not interested in placement at this time o [name]'s cousin has agreed to assist with patient care needs by dropping  food off multiple times a week and checking on the patient while [name] is at work  Ambulance person with Consulting civil engineer to inform of interventions and plan  Scheduled follow up call over the next two weeks  Patient Self Care Activities: With the help of his daughter  Self administers medications as prescribed  Attends all scheduled provider appointments  Performs ADL's independently  Calls provider office for new concerns or questions  Please see past updates related to this goal by clicking on the "Past Updates" button in the selected goal          Follow Up Plan: SW will follow up with patient by phone over the next 90 days.   [name], BSW, CDP Social Worker, Certified Dementia Practitioner [facility] [phone]  Total time spent performing care coordination and/or care management activities with the patient by phone or face to face = 10 minutes.

## 2020-02-04 NOTE — Patient Instructions (Signed)
Social Worker Visit Information  Goals we discussed today:  Goals Addressed            This Visit's Progress   . Collaborate with RN Care Manager to assist with care coordination needs post inpatient stay       CARE PLAN ENTRY (see longitudinal plan of care for additional care plan information)  Current Barriers:  . Level of care concerns . Social Isolation . Recent inpatient stay from 9/23- 9/30 . 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 Successful outbound call placed to the patients daughter/caregiver [name] to assess goal progression . Determined the patient continues to do well in the home . [name] does report the patient did allow a visit from [facility] . No acute SW needs identified during this call . Scheduled follow up call over the next 90 days . Encouraged Mrs. Foster to contact SW as needed prior to next scheduled call  Completed 12/23/19 with [name] . Inbound call received from patients daughter [name] . Determined the patient has refused home health services at this time due to worry over increasing COVID exposure in the home . Discussed [name] was contacted by [place] spoke with Remote RN who indicated patient was already active with service prior to new referral placed recently by Dr. Baird Cancer o Rosamaria Lints SW would inform Dr. Baird Cancer the patient is not interested in working with [facility] due to concern of increasing COVID exposure in the home o Informed by [name] she would like to keep the service at this time in hopes her father will change his mind  . Determined the patient has exhibited increased agitation toward [name] and her cousin who is assisting with patient care  approximately 3 days/week o Discussed patients decreased independence and worsening eyesight may be contributing to increase in agitation o Encouraged [name] to contact SW as needed for resource needs o Praised [name] for care she is providing the patient at this time to allow him to remain safely in his home . Discussed plans for upcoming procedures o Patient will have teeth extractions performed over the next week o Planned valve replacement for November 9th . Scheduled follow up call to [facility] over the next month to assist with ongoing care coordination needs  Completed 12/15/19 with [name] . Successful outbound call placed to the patients daughter in response to a voice message requesting SW assistance . Discussed patient discharged from the hospital this morning in order to attend HD treatment o The patient was at [facility] from 10/10 - 10/12 due to near syncope and ESRD . Determined the patient has not yet received home health services and declined a referral during discharge due to a past referral being placed . Performed chart review to note a referral was placed by the patients primary provider on 10/5 o Discussed barriers to start of care including staff challenges due to [place] 19 pandemic as well as recent inpatient stay o Advised [name] the referral coordinator is actively working this referral . Discussed [name]'s concern surrounding increased exposure risk in the home to [place] 19 by allowing home health in the home o Rosamaria Lints all home health agencies are following COVID protocols and using appropriate PPE o  Determined [name] plans to review COVID protocol telephonically prior to consenting to services . Collaboration with RN Care Manager to inform of patient disposition and plan . Collaboration with Dr. Baird Cancer to review [name] concern with allowing home health services in the home as well as to discuss if the patient may benefit from [facility] o Determined Dr Baird Cancer agrees  with [facility] referral  Completed 12/07/19 with the patient and his daughter [name] . Inter-disciplinary care team collaboration (see longitudinal plan of care) . Successful outbound call placed to the patients daughter and caregiver [name] to assess for post-discharge needs . Determined the patient did receive a call over the weekend from [facility] to complete a TOC call o During this call the patient did agree to home health services o [name] reports she is unclear if home health had been ordered or to which agency . Performed chart review to note PA documented she would inform inpatient SW and PCP of patient decision to participate in home health . Collaboration with Dr. Baird Cancer to determine if she would be the ordering provider . Joint call completed with [name] and the patient to assess for care coordination needs o Discussed the patient is currently receiving MQB Medicaid and is not interested in placement at this time o [name]'s cousin has agreed to assist with patient care needs by dropping food off multiple times a week and checking on the patient while [name] is at work . Collaboration with RN Care Manager to inform of interventions and plan . Scheduled follow up call over the next two weeks  Patient Self Care Activities: With the help of his daughter . Self administers medications as prescribed . Attends all scheduled provider appointments . Performs ADL's independently . Calls provider office for new concerns or questions  Please see past updates related to this goal by clicking on the "Past Updates" button in the selected goal            Follow Up Plan: SW will follow up with patient by phone over the next 90 days.   [name], BSW, CDP Social Worker, Certified Dementia Practitioner [facility] [phone]

## 2020-02-06 DIAGNOSIS — D509 Iron deficiency anemia, unspecified: Secondary | ICD-10-CM | POA: Diagnosis not present

## 2020-02-06 DIAGNOSIS — N2581 Secondary hyperparathyroidism of renal origin: Secondary | ICD-10-CM | POA: Diagnosis not present

## 2020-02-06 DIAGNOSIS — N186 End stage renal disease: Secondary | ICD-10-CM | POA: Diagnosis not present

## 2020-02-06 DIAGNOSIS — D631 Anemia in chronic kidney disease: Secondary | ICD-10-CM | POA: Diagnosis not present

## 2020-02-06 DIAGNOSIS — Z992 Dependence on renal dialysis: Secondary | ICD-10-CM | POA: Diagnosis not present

## 2020-02-09 DIAGNOSIS — D509 Iron deficiency anemia, unspecified: Secondary | ICD-10-CM | POA: Diagnosis not present

## 2020-02-09 DIAGNOSIS — N2581 Secondary hyperparathyroidism of renal origin: Secondary | ICD-10-CM | POA: Diagnosis not present

## 2020-02-09 DIAGNOSIS — D631 Anemia in chronic kidney disease: Secondary | ICD-10-CM | POA: Diagnosis not present

## 2020-02-09 DIAGNOSIS — Z992 Dependence on renal dialysis: Secondary | ICD-10-CM | POA: Diagnosis not present

## 2020-02-09 DIAGNOSIS — N186 End stage renal disease: Secondary | ICD-10-CM | POA: Diagnosis not present

## 2020-02-10 DIAGNOSIS — N186 End stage renal disease: Secondary | ICD-10-CM | POA: Diagnosis not present

## 2020-02-10 DIAGNOSIS — I871 Compression of vein: Secondary | ICD-10-CM | POA: Diagnosis not present

## 2020-02-10 DIAGNOSIS — Z992 Dependence on renal dialysis: Secondary | ICD-10-CM | POA: Diagnosis not present

## 2020-02-10 DIAGNOSIS — T82898A Other specified complication of vascular prosthetic devices, implants and grafts, initial encounter: Secondary | ICD-10-CM | POA: Diagnosis not present

## 2020-02-11 DIAGNOSIS — N2581 Secondary hyperparathyroidism of renal origin: Secondary | ICD-10-CM | POA: Diagnosis not present

## 2020-02-11 DIAGNOSIS — Z992 Dependence on renal dialysis: Secondary | ICD-10-CM | POA: Diagnosis not present

## 2020-02-11 DIAGNOSIS — N186 End stage renal disease: Secondary | ICD-10-CM | POA: Diagnosis not present

## 2020-02-11 DIAGNOSIS — D631 Anemia in chronic kidney disease: Secondary | ICD-10-CM | POA: Diagnosis not present

## 2020-02-11 DIAGNOSIS — D509 Iron deficiency anemia, unspecified: Secondary | ICD-10-CM | POA: Diagnosis not present

## 2020-02-13 DIAGNOSIS — D509 Iron deficiency anemia, unspecified: Secondary | ICD-10-CM | POA: Diagnosis not present

## 2020-02-13 DIAGNOSIS — N2581 Secondary hyperparathyroidism of renal origin: Secondary | ICD-10-CM | POA: Diagnosis not present

## 2020-02-13 DIAGNOSIS — D631 Anemia in chronic kidney disease: Secondary | ICD-10-CM | POA: Diagnosis not present

## 2020-02-13 DIAGNOSIS — N186 End stage renal disease: Secondary | ICD-10-CM | POA: Diagnosis not present

## 2020-02-13 DIAGNOSIS — Z992 Dependence on renal dialysis: Secondary | ICD-10-CM | POA: Diagnosis not present

## 2020-02-16 DIAGNOSIS — D509 Iron deficiency anemia, unspecified: Secondary | ICD-10-CM | POA: Diagnosis not present

## 2020-02-16 DIAGNOSIS — N2581 Secondary hyperparathyroidism of renal origin: Secondary | ICD-10-CM | POA: Diagnosis not present

## 2020-02-16 DIAGNOSIS — D631 Anemia in chronic kidney disease: Secondary | ICD-10-CM | POA: Diagnosis not present

## 2020-02-16 DIAGNOSIS — N186 End stage renal disease: Secondary | ICD-10-CM | POA: Diagnosis not present

## 2020-02-16 DIAGNOSIS — Z992 Dependence on renal dialysis: Secondary | ICD-10-CM | POA: Diagnosis not present

## 2020-02-18 DIAGNOSIS — Z992 Dependence on renal dialysis: Secondary | ICD-10-CM | POA: Diagnosis not present

## 2020-02-18 DIAGNOSIS — N2581 Secondary hyperparathyroidism of renal origin: Secondary | ICD-10-CM | POA: Diagnosis not present

## 2020-02-18 DIAGNOSIS — D631 Anemia in chronic kidney disease: Secondary | ICD-10-CM | POA: Diagnosis not present

## 2020-02-18 DIAGNOSIS — N186 End stage renal disease: Secondary | ICD-10-CM | POA: Diagnosis not present

## 2020-02-18 DIAGNOSIS — D509 Iron deficiency anemia, unspecified: Secondary | ICD-10-CM | POA: Diagnosis not present

## 2020-02-20 DIAGNOSIS — D631 Anemia in chronic kidney disease: Secondary | ICD-10-CM | POA: Diagnosis not present

## 2020-02-20 DIAGNOSIS — Z992 Dependence on renal dialysis: Secondary | ICD-10-CM | POA: Diagnosis not present

## 2020-02-20 DIAGNOSIS — N2581 Secondary hyperparathyroidism of renal origin: Secondary | ICD-10-CM | POA: Diagnosis not present

## 2020-02-20 DIAGNOSIS — N186 End stage renal disease: Secondary | ICD-10-CM | POA: Diagnosis not present

## 2020-02-20 DIAGNOSIS — D509 Iron deficiency anemia, unspecified: Secondary | ICD-10-CM | POA: Diagnosis not present

## 2020-02-23 DIAGNOSIS — D509 Iron deficiency anemia, unspecified: Secondary | ICD-10-CM | POA: Diagnosis not present

## 2020-02-23 DIAGNOSIS — D631 Anemia in chronic kidney disease: Secondary | ICD-10-CM | POA: Diagnosis not present

## 2020-02-23 DIAGNOSIS — Z992 Dependence on renal dialysis: Secondary | ICD-10-CM | POA: Diagnosis not present

## 2020-02-23 DIAGNOSIS — N2581 Secondary hyperparathyroidism of renal origin: Secondary | ICD-10-CM | POA: Diagnosis not present

## 2020-02-23 DIAGNOSIS — N186 End stage renal disease: Secondary | ICD-10-CM | POA: Diagnosis not present

## 2020-02-24 ENCOUNTER — Ambulatory Visit (INDEPENDENT_AMBULATORY_CARE_PROVIDER_SITE_OTHER): Payer: Medicare Other | Admitting: Podiatry

## 2020-02-24 ENCOUNTER — Other Ambulatory Visit: Payer: Self-pay

## 2020-02-24 DIAGNOSIS — M2041 Other hammer toe(s) (acquired), right foot: Secondary | ICD-10-CM

## 2020-02-24 DIAGNOSIS — E1142 Type 2 diabetes mellitus with diabetic polyneuropathy: Secondary | ICD-10-CM | POA: Diagnosis not present

## 2020-02-24 DIAGNOSIS — M79675 Pain in left toe(s): Secondary | ICD-10-CM | POA: Diagnosis not present

## 2020-02-24 DIAGNOSIS — B351 Tinea unguium: Secondary | ICD-10-CM

## 2020-02-24 DIAGNOSIS — M2042 Other hammer toe(s) (acquired), left foot: Secondary | ICD-10-CM

## 2020-02-24 DIAGNOSIS — M79674 Pain in right toe(s): Secondary | ICD-10-CM

## 2020-02-24 NOTE — Progress Notes (Signed)
Subjective:  Patient ID: Tristan J Wardlow, male    DOB: 05/09/1940,  MRN: [id number]  79 y.o. 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 Pain interferes with ambulation. Aggravating factors include wearing enclosed shoe gear. Pain is relieved with periodic professional debridement.   His daughter is present during today's visit.  Mr. Funnell started hemodialysis in February and is on a TTS schedule.   He also states he has burning on the bottom of his feet. He takes gabapentin for neuropathy.  Review of Systems: Negative except as noted in the HPI.  Past Medical History:  Diagnosis Date  . Anemia    low iron  . Arthritis   . Cancer (HCC) ?2006   prostate  . Colon polyps ~ 1993 and 2003   Dr John Hayes, [facility].  08/2001 colonoscopy: tubular adenoma at cecum.    . 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 Elevated cholesterol with high triglycerides   . ESRD (end stage renal disease) (HCC)    [facility]  . GERD (gastroesophageal reflux disease)   . Hypertension   . Legally blind in left eye, as defined in [place]    has pinpoint vision in right eye  . RBBB    Past Surgical History:  Procedure Laterality Date  . AV FISTULA PLACEMENT Left 03/20/2018   Procedure: ARTERIOVENOUS (AV) FISTULA CREATION ARM;  Surgeon: Cain, Brandon Christopher, MD;  Location: [facility] OR;  Service: Vascular;  Laterality: Left;  . BASCILIC VEIN TRANSPOSITION Left 05/21/2018   Procedure: LEFT BASILIC VEIN FISTULA SECOND STAGE;  Surgeon: Cain, Brandon Christopher, MD;  Location: [facility] OR;  Service: Vascular;  Laterality: Left;  . COLONOSCOPY    . COLONOSCOPY WITH PROPOFOL N/A 11/30/2019   Procedure: COLONOSCOPY WITH PROPOFOL;  Surgeon: Karki, Arya, MD;  Location: [facility];  Service: Gastroenterology;  Laterality: N/A;  . GLAUCOMA SURGERY  2019   multiple surgeries  . HEMOSTASIS CLIP  PLACEMENT  11/30/2019   Procedure: HEMOSTASIS CLIP PLACEMENT;  Surgeon: Karki, Arya, MD;  Location: [facility];  Service: Gastroenterology;;  . KNEE ARTHROSCOPY    . MULTIPLE EXTRACTIONS WITH ALVEOLOPLASTY N/A 12/30/2019   Procedure: MULTIPLE EXTRACTION WITH ALVEOLOPLASTY;  Surgeon: Owsley, Madison B, DMD;  Location: [facility];  Service: Dentistry;  Laterality: N/A;  . POLYPECTOMY  11/30/2019   Procedure: POLYPECTOMY;  Surgeon: Karki, Arya, MD;  Location: [facility];  Service: Gastroenterology;;  . PROSTATE SURGERY    . RIGHT/LEFT HEART CATH AND CORONARY ANGIOGRAPHY N/A 12/03/2019   Procedure: RIGHT/LEFT HEART CATH AND CORONARY ANGIOGRAPHY;  Surgeon: McAlhany, Christopher D, MD;  [facility];  Service: Cardiovascular;  Laterality: N/A;  . SUBMUCOSAL TATTOO INJECTION  11/30/2019   Procedure: SUBMUCOSAL TATTOO INJECTION;  Surgeon: Karki, Arya, MD;  Location: [facility];  Service: Gastroenterology;;  . TEE WITHOUT CARDIOVERSION N/A 01/19/2020   Procedure: TRANSESOPHAGEAL ECHOCARDIOGRAM (TEE);  Surgeon: McAlhany, Christopher D, MD;  [facility];  Service: Open Heart Surgery;  Laterality: N/A;  . TRANSCATHETER AORTIC VALVE REPLACEMENT, TRANSFEMORAL Left 01/19/2020   Procedure: TRANSCATHETER AORTIC VALVE REPLACEMENT, LEFT TRANSFEMORAL;  Surgeon: McAlhany, Christopher D, MD;  [facility];  Service: Open Heart Surgery;  Laterality: Left;   Patient Active Problem List   Diagnosis Date Noted  . 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  . 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 RBBB   . Near syncope 12/13/2019  . Severe aortic stenosis   . ESRD (end stage renal disease) ([place]) 11/26/2019  . Macrocytic anemia 11/26/2019  . 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 ARF (acute renal failure) ([place]) 03/25/2018  . Vision loss of right eye 11/11/2017  . Bilateral  pseudophakia 07/11/2017  . GERD (gastroesophageal reflux disease) 09/10/2016  . HLD (hyperlipidemia) 09/10/2016  . Essential hypertension 08/13/2016  . 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  . Type 2 diabetes mellitus with renal manifestations ([place]) 10/22/2006  . BPH (benign prostatic hyperplasia) 10/22/2006  . COLONIC POLYPS, HX OF 10/22/2006    Current Outpatient Medications:  .  acetaminophen (TYLENOL) 500 MG tablet, Take 2 tablets (1,000 mg total) by mouth every 6 (six) hours as needed for mild pain, moderate pain or headache., Disp: 60 tablet, Rfl: 0 .  amoxicillin (AMOXIL) 500 MG tablet, Take 2000 mg (4 tablets) by mouth ONE HOUR BEFORE ANY DENTAL PROCEDURES, Disp: 12 tablet, Rfl: 1 .  apraclonidine (IOPIDINE) 0.5 % ophthalmic solution, Place 1 drop into the right eye 3 (three) times daily. , Disp: , Rfl:  .  atorvastatin (LIPITOR) 10 MG tablet, TAKE 1 TABLET BY MOUTH EVERY DAY, Disp: 90 tablet, Rfl: 0 .  B Complex-C-Folic Acid (DIALYVITE 300) 0.8 MG TABS, Take 0.8 mg by mouth daily. , Disp: , Rfl:  .  blood glucose meter kit and supplies KIT, Dispense based on patient and insurance preference. Use up to four times daily as directed. (FOR ICD-9 250.00, 250.01). For QAC - HS accuchecks., Disp: 1 each, Rfl: 1 .  carvedilol (COREG) 3.125 MG tablet, TAKE 1 TABLET(3.125 MG) BY MOUTH TWICE DAILY WITH A MEAL, Disp: 180 tablet, Rfl: 1 .  clopidogrel (PLAVIX) 75 MG tablet, Take 1 tablet (75 mg total) by mouth daily with breakfast., Disp: 90 tablet, Rfl: 1 .  diclofenac Sodium (VOLTAREN) 1 % GEL, Apply 4 g topically 4 (four) times daily as needed., Disp: 100 g, Rfl: 0 .  dorzolamide-timolol (COSOPT) 22.3-6.8 MG/ML ophthalmic solution, Place 1 drop into the right eye 2 (two) times daily., Disp: , Rfl: 11 .  ferric citrate (AURYXIA) 1 GM 210 MG(Fe) tablet, Take 420 mg by mouth 3 (three) times daily with meals., Disp: , Rfl:  .  gabapentin (NEURONTIN) 100 MG capsule, Take 1 capsule (100 mg total) by  mouth at bedtime., Disp: 90 capsule, Rfl: 1 .  hydrALAZINE (APRESOLINE) 25 MG tablet, TAKE 1 TABLET(25 MG) BY MOUTH TWICE DAILY, Disp: 180 tablet, Rfl: 1 .  hydroxypropyl methylcellulose / hypromellose (ISOPTO TEARS / GONIOVISC) 2.5 % ophthalmic solution, Place 1 drop into the left eye as needed for dry eyes., Disp: , Rfl:  .  isosorbide mononitrate (IMDUR) 30 MG 24 hr tablet, TAKE 1 TABLET(30 MG) BY MOUTH DAILY, Disp: 90 tablet, Rfl: 1 .  Lancets (ONETOUCH DELICA PLUS [id number]) MISC, USE AS DIRECTED UP TO FOUR TIMES A DAY, Disp: , Rfl:  .  latanoprost (XALATAN) 0.005 % ophthalmic solution, Place 1 drop into the right eye at bedtime., Disp: , Rfl:  .  ONETOUCH VERIO test strip, USE AS DIRECTED TO TEST FOUR TIMES A DAY, Disp: 100 each, Rfl: 3 .  pantoprazole (PROTONIX) 40 MG tablet, Take 1 tablet (40 mg total) by mouth daily at 12 noon., Disp: 90 tablet, Rfl: 1 .  pilocarpine (PILOCAR) 4 % ophthalmic solution, Place 1 drop into the right eye 4 (four) times  daily. , Disp: , Rfl:  No Known Allergies Social History   Tobacco Use  Smoking Status Former Smoker  . Packs/day: 0.50  . Quit date: 03/23/2018  . Years since quitting: 1.9  Smokeless Tobacco Never Used   Objective:  There were no vitals filed for this visit. Constitutional Patient is a pleasant 79 y.o. African American male WD, WN in NAD.[name] AAO x 3.  Vascular Capillary fill time to digits <3 seconds b/l lower extremities. Faintly palpable DP pulse(s) b/l lower extremities. Faintly palpable PT pulse(s) b/l lower extremities. Pedal hair sparse. Lower extremity skin temperature gradient within normal limits. No ischemia or gangrene noted b/l lower extremities. No cyanosis or clubbing noted.  Neurologic Normal speech.Orie nted to person, place, and time. Pt has subjective symptoms of neuropathy. Protective sensation intact 5/5 intact bilaterally with 10g monofilament b/l. Vibratory sensation intact b/l.  Dermatologic Pedal skin with normal  turgor, texture and tone bilaterally. No open wounds bilaterally. No interdigital macerations bilaterally. Toenails 1-5 b/l elongated, discolored, dystrophic, thickened, crumbly with subungual debris and tenderness to dorsal palpation. No hyperkeratotic nor porokeratotic lesions present on today's visit.  Orthopedic: Normal muscle strength 5/5 to all lower extremity muscle groups bilaterally. Hammertoes noted to the L 5th toe and R 5th toe. Utilizes walker for ambulation assistance.   Hemoglobin A1C Latest Ref Rng & Units 01/14/2020 11/26/2019 09/11/2019  HGBA1C 4.8 - 5.6 % 7.1(H) 7.5(H) 6.7  Some recent data might be hidden   ADA FOOT RISK CATEGORIZATION Low Risk :  Patient has all of the following: Intact protective sensation No prior foot ulcer  No severe deformity Pedal pulses present  Assessment:   1. Pain due to onychomycosis of toenails of both feet   2. Acquired hammertoes of both feet   3. Diabetic peripheral neuropathy associated with type 2 diabetes mellitus ([place])    Plan:  Patient was evaluated and treated and all questions answered.  Onychomycosis with pain -Nails palliatively debridement as below. -Educated on self-care  Procedure: Nail Debridement Rationale: Pain Type of Debridement: manual, sharp debridement. Instrumentation: Nail nipper, rotary burr. Number of Nails: 10  -Examined patient. -Diabetic foot examination performed today.  -Advised Mr. Belcher and his daughter to discuss gabapentin dosage with prescriber. There are reasons he may not be able to take an increased dosage and I have asked them to discuss this with the prescriber. They related understanding. -Toenails 1-5 b/l were debrided in length and girth with sterile nail nippers and dremel without iatrogenic bleeding.  -Patient to report any pedal injuries to medical professional immediately. -For foot pain, he is using Aspercreme to feet prn. -Patient to continue soft, supportive shoe gear  daily. -Patient/POA to call should there be question/concern in the interim.  Return in about 3 months (around 05/24/2020).  [name], DPM

## 2020-02-25 DIAGNOSIS — N2581 Secondary hyperparathyroidism of renal origin: Secondary | ICD-10-CM | POA: Diagnosis not present

## 2020-02-25 DIAGNOSIS — D631 Anemia in chronic kidney disease: Secondary | ICD-10-CM | POA: Diagnosis not present

## 2020-02-25 DIAGNOSIS — Z992 Dependence on renal dialysis: Secondary | ICD-10-CM | POA: Diagnosis not present

## 2020-02-25 DIAGNOSIS — D509 Iron deficiency anemia, unspecified: Secondary | ICD-10-CM | POA: Diagnosis not present

## 2020-02-25 DIAGNOSIS — N186 End stage renal disease: Secondary | ICD-10-CM | POA: Diagnosis not present

## 2020-02-28 DIAGNOSIS — D509 Iron deficiency anemia, unspecified: Secondary | ICD-10-CM | POA: Diagnosis not present

## 2020-02-28 DIAGNOSIS — Z992 Dependence on renal dialysis: Secondary | ICD-10-CM | POA: Diagnosis not present

## 2020-02-28 DIAGNOSIS — N2581 Secondary hyperparathyroidism of renal origin: Secondary | ICD-10-CM | POA: Diagnosis not present

## 2020-02-28 DIAGNOSIS — D631 Anemia in chronic kidney disease: Secondary | ICD-10-CM | POA: Diagnosis not present

## 2020-02-28 DIAGNOSIS — N186 End stage renal disease: Secondary | ICD-10-CM | POA: Diagnosis not present

## 2020-02-29 ENCOUNTER — Encounter: Payer: Self-pay | Admitting: Podiatry

## 2020-03-01 DIAGNOSIS — Z992 Dependence on renal dialysis: Secondary | ICD-10-CM | POA: Diagnosis not present

## 2020-03-01 DIAGNOSIS — N2581 Secondary hyperparathyroidism of renal origin: Secondary | ICD-10-CM | POA: Diagnosis not present

## 2020-03-01 DIAGNOSIS — N186 End stage renal disease: Secondary | ICD-10-CM | POA: Diagnosis not present

## 2020-03-01 DIAGNOSIS — D631 Anemia in chronic kidney disease: Secondary | ICD-10-CM | POA: Diagnosis not present

## 2020-03-01 DIAGNOSIS — D509 Iron deficiency anemia, unspecified: Secondary | ICD-10-CM | POA: Diagnosis not present

## 2020-03-02 ENCOUNTER — Ambulatory Visit (HOSPITAL_COMMUNITY): Payer: Medicare Other | Attending: Internal Medicine

## 2020-03-02 ENCOUNTER — Other Ambulatory Visit: Payer: Self-pay | Admitting: Physician Assistant

## 2020-03-02 ENCOUNTER — Other Ambulatory Visit: Payer: Self-pay

## 2020-03-02 ENCOUNTER — Encounter: Payer: Self-pay | Admitting: Physician Assistant

## 2020-03-02 ENCOUNTER — Ambulatory Visit: Payer: Medicare Other | Admitting: Physician Assistant

## 2020-03-02 VITALS — BP 120/42 | HR 68 | Ht 71.0 in | Wt 164.0 lb

## 2020-03-02 DIAGNOSIS — R911 Solitary pulmonary nodule: Secondary | ICD-10-CM

## 2020-03-02 DIAGNOSIS — I4510 Unspecified right bundle-branch block: Secondary | ICD-10-CM | POA: Diagnosis not present

## 2020-03-02 DIAGNOSIS — Z952 Presence of prosthetic heart valve: Secondary | ICD-10-CM

## 2020-03-02 DIAGNOSIS — N186 End stage renal disease: Secondary | ICD-10-CM

## 2020-03-02 LAB — ECHOCARDIOGRAM COMPLETE
AV Mean grad: 8 mmHg
AV Peak grad: 15.2 mmHg
Ao pk vel: 1.95 m/s
Area-P 1/2: 2.54 cm2
S' Lateral: 2.4 cm

## 2020-03-02 NOTE — Progress Notes (Signed)
[facility] [place]                                       Cardiology Office Note    Date:  03/02/2020  ID:  Elijah White, DOB 18-Aug-1940, MRN [id number]  PCP:  [name], MD  Cardiologist:  Evalina Field, MD / Dr. Buena Irish & Dr. Cyndia Bent (TAVR)  CC: 1 month s/p TAVR  History of Present Illness:  Elijah White is a 79 y.o. 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   He was placed on HD in2/2021. He is followed by Dr. [name].He was admitted to the hospital in 09/2017 for acute on chronic diastolic heart failure,but has never been followed by cardiology.  He was in his usual state of heath until 11/26/19 when he presented to [facility] via EMS with chest pain/epigastric pain and dypsnea while en route to hemodialysis.Upon arrival to ED: Patient's vital signs stable, afebrile, no leukocytosis, maintaining oxygen saturation on room air,HStroponin 36--> 37, UYQ:IHKVQ with RBBB and no acute changes, COVID-19 negative. CT head negative for acute findings, lipase: WNL. Chest x-ray showedpulmonary vascular congestion without pulmonary edema andmild cardiomegaly. CTA chest/abd/pelvis: Negative for PE or aorticdissection. However, there was a2.3 cm enhancing mass within the lumen of the mid transverse colon, concerning for neoplasm and markedly distended gallbladder with numerous small calcified gallstones layering dependentlyas well as a2.7 cm indeterminate left adrenal mass. A murmur was noted on exam prompting cardiology consultation and echo. He was cleared for colonoscopy which showed a polyp without evidence of malignancy. Echocardiogram showed preserved LV function with moderate to severe AS:V-max of 3.31m/s, mean gradient 26 mmHg,and aortic valve area~1.0cm.Dimensionless index around 0.31. Stroke-volume  index reported at 44 cc/m.He was being followed by Dr. Audie Box who felt his cardiac exam revealed a late peaking systoloic murmur more c/w severe AS.Given symptoms of chest pain and exertional dyspnea structural heart evaluation with cardiac CT scanning was requested for further work up.CT scans were c/w severe AS and plans were made for TAVR after dental extractions.   The patient was evaluated by the multidisciplinary valve team and underwent successful TAVR with a 26 mm Edwards Sapien 3 Ultra THV via the TF approach on 01/19/20. Post operative echo showed EF 60%, normally functioning TAVR with a mean gradient of 4 mmHg and no PVL. He was started on Plavix $RemoveB'75mg'fqXqQxpn$  daily to be continued for 6 months. He was discharged with a Zio AT given underlying RBBB. He has had a big clinical improvement since TAVR.   Today he presents to clinic for follow up. Here with his daughter, [name]. Doing well with no complaints. Occasionally get some chest tightness but no SOB. No LE edema, orthopnea or PND. No dizziness or syncope. No blood in stool or urine. No palpitations.  Past Medical History:  Diagnosis Date  . Anemia    low iron  . Arthritis   . Cancer [facility]) ?2006   prostate  . Colon polyps ~ 1993 and 2003   Dr [name], [facility].  08/2001 colonoscopy: tubular adenoma at cecum.    . 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 Elevated cholesterol with high triglycerides   . ESRD (end stage  renal disease) ([place])    [facility]  . GERD (gastroesophageal reflux disease)   . Hypertension   . Legally blind in left eye, as defined in [place]    has pinpoint vision in right eye  . RBBB     Past Surgical History:  Procedure Laterality Date  . AV FISTULA PLACEMENT Left 03/20/2018   Procedure: ARTERIOVENOUS (AV) FISTULA CREATION ARM;  Surgeon: [name], MD;  Location: [place];  Service: Vascular;  Laterality: Left;  . BASCILIC VEIN TRANSPOSITION Left  05/21/2018   Procedure: LEFT BASILIC VEIN FISTULA SECOND STAGE;  Surgeon: [name], MD;  Location: [place];  Service: Vascular;  Laterality: Left;  . COLONOSCOPY    . COLONOSCOPY WITH PROPOFOL N/A 11/30/2019   Procedure: COLONOSCOPY WITH PROPOFOL;  Surgeon: Ronnette Juniper, MD;  Location: [place];  Service: Gastroenterology;  Laterality: N/A;  . GLAUCOMA SURGERY  2019   multiple surgeries  . HEMOSTASIS CLIP PLACEMENT  11/30/2019   Procedure: HEMOSTASIS CLIP PLACEMENT;  Surgeon: Ronnette Juniper, MD;  Location: [place];  Service: Gastroenterology;;  . KNEE ARTHROSCOPY    . MULTIPLE EXTRACTIONS WITH ALVEOLOPLASTY N/A 12/30/2019   Procedure: MULTIPLE EXTRACTION WITH ALVEOLOPLASTY;  Surgeon: Charlaine Dalton, DMD;  Location: [place];  Service: Dentistry;  Laterality: N/A;  . POLYPECTOMY  11/30/2019   Procedure: POLYPECTOMY;  Surgeon: Ronnette Juniper, MD;  Location: [place];  Service: Gastroenterology;;  . PROSTATE SURGERY    . RIGHT/LEFT HEART CATH AND CORONARY ANGIOGRAPHY N/A 12/03/2019   Procedure: RIGHT/LEFT HEART CATH AND CORONARY ANGIOGRAPHY;  Surgeon: Burnell Blanks, MD;  Location: [place] [facility];  Service: Cardiovascular;  Laterality: N/A;  . SUBMUCOSAL TATTOO INJECTION  11/30/2019   Procedure: SUBMUCOSAL TATTOO INJECTION;  Surgeon: Ronnette Juniper, MD;  Location: [facility];  Service: Gastroenterology;;  . TEE WITHOUT CARDIOVERSION N/A 01/19/2020   Procedure: TRANSESOPHAGEAL ECHOCARDIOGRAM (TEE);  Surgeon: Burnell Blanks, MD;  Location: [place] [facility];  Service: Open Heart Surgery;  Laterality: N/A;  . TRANSCATHETER AORTIC VALVE REPLACEMENT, TRANSFEMORAL Left 01/19/2020   Procedure: TRANSCATHETER AORTIC VALVE REPLACEMENT, LEFT TRANSFEMORAL;  Surgeon: Burnell Blanks, MD;  Location: [place] [facility];  Service: Open Heart Surgery;  Laterality: Left;    Current Medications: Outpatient Medications Prior to Visit  Medication Sig Dispense Refill   . acetaminophen (TYLENOL) 500 MG tablet Take 2 tablets (1,000 mg total) by mouth every 6 (six) hours as needed for mild pain, moderate pain or headache. 60 tablet 0  . apraclonidine (IOPIDINE) 0.5 % ophthalmic solution Place 1 drop into the right eye 3 (three) times daily.     [name] atorvastatin (LIPITOR) 10 MG tablet TAKE 1 TABLET BY MOUTH EVERY DAY 90 tablet 0  . B Complex-C-Folic Acid (DIALYVITE 010) 0.8 MG TABS Take 0.8 mg by mouth daily.     . blood glucose meter kit and supplies KIT Dispense based on patient and insurance preference. Use up to four times daily as directed. (FOR ICD-9 250.00, 250.01). For QAC - HS accuchecks. 1 each 1  . carvedilol (COREG) 3.125 MG tablet TAKE 1 TABLET(3.125 MG) BY MOUTH TWICE DAILY WITH A MEAL 180 tablet 1  . clopidogrel (PLAVIX) 75 MG tablet Take 1 tablet (75 mg total) by mouth daily with breakfast. 90 tablet 1  . dorzolamide-timolol (COSOPT) 22.3-6.8 MG/ML ophthalmic solution Place 1 drop into the right eye 2 (two) times daily.  11  . gabapentin (NEURONTIN) 100 MG capsule Take 1 capsule (100 mg total)  by mouth at bedtime. 90 capsule 1  . hydrALAZINE (APRESOLINE) 25 MG tablet TAKE 1 TABLET(25 MG) BY MOUTH TWICE DAILY 180 tablet 1  . hydroxypropyl methylcellulose / hypromellose (ISOPTO TEARS / GONIOVISC) 2.5 % ophthalmic solution Place 1 drop into the left eye as needed for dry eyes.    . isosorbide mononitrate (IMDUR) 30 MG 24 hr tablet TAKE 1 TABLET(30 MG) BY MOUTH DAILY 90 tablet 1  . Lancets (ONETOUCH DELICA PLUS LANCET33G) MISC USE AS DIRECTED UP TO FOUR TIMES A DAY    . latanoprost (XALATAN) 0.005 % ophthalmic solution Place 1 drop into the right eye at bedtime.    Letta Pate VERIO test strip USE AS DIRECTED TO TEST FOUR TIMES A DAY 100 each 3  . pantoprazole (PROTONIX) 40 MG tablet Take 1 tablet (40 mg total) by mouth daily at 12 noon. 90 tablet 1  . pilocarpine (PILOCAR) 4 % ophthalmic solution Place 1 drop into the right eye 4 (four) times daily.     [name]  amoxicillin (AMOXIL) 500 MG tablet Take 2000 mg (4 tablets) by mouth ONE HOUR BEFORE ANY DENTAL PROCEDURES (Patient not taking: Reported on 03/02/2020) 12 tablet 1  . diclofenac Sodium (VOLTAREN) 1 % GEL Apply 4 g topically 4 (four) times daily as needed. (Patient not taking: Reported on 03/02/2020) 100 g 0  . ferric citrate (AURYXIA) 1 GM 210 MG(Fe) tablet Take 420 mg by mouth 3 (three) times daily with meals. (Patient not taking: Reported on 03/02/2020)     No facility-administered medications prior to visit.     Allergies:   Patient has no known allergies.   Social History   Socioeconomic History  . Marital status: Widowed    Spouse name: Not on file  . Number of children: Not on file  . Years of education: Not on file  . Highest education level: Not on file  Occupational History  . Occupation: retired  Tobacco Use  . Smoking status: Former Smoker    Packs/day: 0.50    Quit date: 03/23/2018    Years since quitting: 1.9  . Smokeless tobacco: Never Used  Vaping Use  . Vaping Use: Never used  Substance and Sexual Activity  . Alcohol use: No  . Drug use: No  . Sexual activity: Not Currently  Other Topics Concern  . Not on file  Social History Narrative  . Not on file   Social Determinants of Health   Financial Resource Strain: Low Risk   . Difficulty of Paying Living Expenses: Not hard at all  Food Insecurity: No Food Insecurity  . Worried About Programme researcher, broadcasting/film/video in the Last Year: Never true  . Ran Out of Food in the Last Year: Never true  Transportation Needs: No Transportation Needs  . Lack of Transportation (Medical): No  . Lack of Transportation (Non-Medical): No  Physical Activity: Inactive  . Days of Exercise per Week: 0 days  . Minutes of Exercise per Session: 0 min  Stress: No Stress Concern Present  . Feeling of Stress : Not at all  Social Connections: Not on file     Family History:  The patient's family history includes CAD in his mother.     ROS:    Please see the history of present illness.    ROS All other systems reviewed and are negative.   PHYSICAL EXAM:   VS:  BP (!) 120/42   Pulse 68   Ht 5\' 11"  (1.803 m)   Wt 164  lb (74.4 kg)   SpO2 95%   BMI 22.87 kg/m    GEN: Well nourished, well developed, in no acute distress HEENT: normal Neck: no JVD or masses Cardiac: RRR; no murmurs, rubs, or gallops,no edema  Respiratory:  clear to auscultation bilaterally, normal work of breathing GI: soft, nontender, nondistended, + BS MS: no deformity or atrophy Skin: warm and dry, no rash.  Neuro:  Alert and Oriented x 3, Strength and sensation are intact Psych: euthymic mood, full affect   Wt Readings from Last 3 Encounters:  03/02/20 164 lb (74.4 kg)  02/03/20 168 lb (76.2 kg)  01/20/20 167 lb 1.7 oz (75.8 kg)      Studies/Labs Reviewed:   EKG:  EKG is NOT ordered today.    Recent Labs: 11/26/2019: TSH 0.820 01/14/2020: ALT 10; B Natriuretic Peptide 335.4 01/20/2020: BUN 34; Creatinine, Ser 8.19; Hemoglobin 10.7; Magnesium 2.0; Platelets 156; Potassium 4.9; Sodium 140   Lipid Panel    Component Value Date/Time   LDLCALC 103 09/11/2019 0000    Additional studies/ records that were reviewed today include:  TAVR OPERATIVE NOTE Date of Procedure:01/19/2020  Preoperative Diagnosis:Severe Aortic Stenosis   Postoperative Diagnosis:Same   Procedure:   Transcatheter Aortic Valve Replacement - PercutaneousLeftTransfemoral Approach Edwards Sapien 3 Ultra THV (size 26mm, model # 9750TFX, serial # [id number])  Co-Surgeons:Bryan Alveria Apley, MD and Lauree Chandler, MD   Anesthesiologist:Robert Ola Spurr, MD  Dala Dock, MD  Pre-operative Echo Findings: ? Severe aortic stenosis ? Normalleft ventricular systolic function  Post-operative Echo Findings: ? Noparavalvular  leak ? Normalleft ventricular systolic function  _____________  Echo 01/20/20:  IMPRESSIONS  1. Left ventricular ejection fraction, by estimation, is 60 to 65%. The  left ventricle has normal function. The left ventricle has no regional  wall motion abnormalities. Left ventricular diastolic parameters were  normal.  2. Right ventricular systolic function is normal. The right ventricular  size is normal.  3. The mitral valve is degenerative. Trivial mitral valve regurgitation.  No evidence of mitral stenosis. Severe mitral annular calcification.  4. Post TAVR 11/16 with 26 mm Sapien 3 Ultra valve. No PVL. Tracings by  tech seem contaminated with high peak gradient measured. I measure mean  gradient closer to 4 mmHg and peak 8 mmHg see image 44 regarding signals  DVI 0.7 and AVA over 3 cm2. The  aortic valve has been repaired/replaced. Aortic valve regurgitation is not  visualized. No aortic stenosis is present. There is a 26 mm Sapien  prosthetic (TAVR) valve present in the aortic position. Procedure Date:  01/19/2020.  5. The inferior vena cava is normal in size with greater than 50%  respiratory variability, suggesting right atrial pressure of 3 mmHg.  _______________________   Elwyn Reach AT 01/20/20 Impression: 1. Brief atrial tachycardia episodes (6 episodes in 14 days; longest 10.8 seconds). 2. No symptoms reported.  3. Rare ectopy.   _______________________  Echo 03/02/20 IMPRESSIONS  1. Left ventricular ejection fraction, by estimation, is 60 to 65%. The left ventricle has normal function. The left ventricle has no regional wall motion abnormalities. Left ventricular diastolic parameters are indeterminate.  2. Right ventricular systolic function is normal. The right ventricular size is normal.  3. The mitral valve is degenerative. Trivial mitral valve regurgitation. Severe mitral annular calcification.  4. No Paravalular leak noted; Acceleration time < 100 ms; DVI  0.76. The aortic valve has been repaired/replaced. Aortic valve regurgitation is not visualized. There is a 26 mm Sapien prosthetic (TAVR) valve present in the  aortic position. Procedure  Date: 01/19/2020.  5. The inferior vena cava is normal in size with greater than 50% respiratory variability, suggesting right atrial pressure of 3 mmHg.  Comparison(s): A prior study was performed on 01/20/2020. Stable prosthetic valve measurements from prior study.  ASSESSMENT & PLAN:   Severe AS s/p TAVR: echo today shows EF 60%, normally functioning TAVR with a mean gradient of 8 mm hg and no PVL. He has NYHA class I symptoms. He has amoxicillin for SBE prophylaxis. I discharged him on aspirin and plavix, but pt only taking plavix. Will keep him on monotherapy with plavix x 6 months and then switch to aspirin indefinitely. I will see him back in 1 year with echo.   ESRD on HD:  tolerating HD better since TAVR.  RBBB: Zio patch showed no HAVB.    Pulmonary nodule: pre TAVR CT showed a tiny 3 mm solid left upper lobe pulmonary nodule. No follow-up needed if patient is low-risk. Non-contrast chest CT can be considered in 12 months if patient is high-risk. Pt has a smoking history and this will be set up in 1 year.     Medication Adjustments/Labs and Tests Ordered: Current medicines are reviewed at length with the patient today.  Concerns regarding medicines are outlined above.  Medication changes, Labs and Tests ordered today are listed in the Patient Instructions below. Patient Instructions  Medication Instructions:  Your physician has recommended you make the following change in your medication:  1-In 6 months you will stop your plavix. At that time, you will need to start on Aspirin 81 mg by mouth daily.  *If you need a refill on your cardiac medications before your next appointment, please call your pharmacy*  Lab Work: If you have labs (blood work) drawn today and your tests are completely  normal, you will receive your results only by: [name] MyChart Message (if you have MyChart) OR . A paper copy in the mail If you have any lab test that is abnormal or we need to change your treatment, we will call you to review the results.  Testing/Procedures: None ordered today.  Follow-Up: At [facility], you and your health needs are our priority.  As part of our continuing mission to provide you with exceptional heart care, we have created designated Provider Care Teams.  These Care Teams include your primary Cardiologist (physician) and Advanced Practice Providers (APPs -  Physician Assistants and Nurse Practitioners) who all work together to provide you with the care you need, when you need it.  We recommend signing up for the patient portal called "MyChart".  Sign up information is provided on this After Visit Summary.  MyChart is used to connect with patients for Virtual Visits (Telemedicine).  Patients are able to view lab/test results, encounter notes, upcoming appointments, etc.  Non-urgent messages can be sent to your provider as well.   To learn more about what you can do with MyChart, go to [email or web address].    Your next appointment:   2 month(s)  The format for your next appointment:   In Person  Provider:   Eleonore Chiquito, MD       Signed, [name], PA-C  03/02/2020 5:00 PM    [address], [place], [place]  [address] Phone: [phone]; Fax: [phone]

## 2020-03-02 NOTE — Patient Instructions (Signed)
Medication Instructions:  Your physician has recommended you make the following change in your medication:  1-In 6 months you will stop your plavix. At that time, you will need to start on Aspirin 81 mg by mouth daily.  *If you need a refill on your cardiac medications before your next appointment, please call your pharmacy*  Lab Work: If you have labs (blood work) drawn today and your tests are completely normal, you will receive your results only by: [name] MyChart Message (if you have MyChart) OR . A paper copy in the mail If you have any lab test that is abnormal or we need to change your treatment, we will call you to review the results.  Testing/Procedures: None ordered today.  Follow-Up: At [facility], you and your health needs are our priority.  As part of our continuing mission to provide you with exceptional heart care, we have created designated Provider Care Teams.  These Care Teams include your primary Cardiologist (physician) and Advanced Practice Providers (APPs -  Physician Assistants and Nurse Practitioners) who all work together to provide you with the care you need, when you need it.  We recommend signing up for the patient portal called "MyChart".  Sign up information is provided on this After Visit Summary.  MyChart is used to connect with patients for Virtual Visits (Telemedicine).  Patients are able to view lab/test results, encounter notes, upcoming appointments, etc.  Non-urgent messages can be sent to your provider as well.   To learn more about what you can do with MyChart, go to [email or web address].    Your next appointment:   2 month(s)  The format for your next appointment:   In Person  Provider:   Eleonore Chiquito, MD

## 2020-03-03 DIAGNOSIS — N186 End stage renal disease: Secondary | ICD-10-CM | POA: Diagnosis not present

## 2020-03-03 DIAGNOSIS — Z992 Dependence on renal dialysis: Secondary | ICD-10-CM | POA: Diagnosis not present

## 2020-03-03 DIAGNOSIS — D631 Anemia in chronic kidney disease: Secondary | ICD-10-CM | POA: Diagnosis not present

## 2020-03-03 DIAGNOSIS — N2581 Secondary hyperparathyroidism of renal origin: Secondary | ICD-10-CM | POA: Diagnosis not present

## 2020-03-03 DIAGNOSIS — D509 Iron deficiency anemia, unspecified: Secondary | ICD-10-CM | POA: Diagnosis not present

## 2020-03-04 DIAGNOSIS — I129 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 kidney disease, or unspecified chronic kidney disease: Secondary | ICD-10-CM | POA: Diagnosis not present

## 2020-03-04 DIAGNOSIS — Z992 Dependence on renal dialysis: Secondary | ICD-10-CM | POA: Diagnosis not present

## 2020-03-04 DIAGNOSIS — N186 End stage renal disease: Secondary | ICD-10-CM | POA: Diagnosis not present

## 2020-03-06 ENCOUNTER — Other Ambulatory Visit: Payer: Self-pay | Admitting: Internal Medicine

## 2020-03-06 DIAGNOSIS — E118 Type 2 diabetes mellitus with unspecified complications: Secondary | ICD-10-CM | POA: Diagnosis not present

## 2020-03-06 DIAGNOSIS — D509 Iron deficiency anemia, unspecified: Secondary | ICD-10-CM | POA: Diagnosis not present

## 2020-03-06 DIAGNOSIS — D631 Anemia in chronic kidney disease: Secondary | ICD-10-CM | POA: Diagnosis not present

## 2020-03-06 DIAGNOSIS — N2581 Secondary hyperparathyroidism of renal origin: Secondary | ICD-10-CM | POA: Diagnosis not present

## 2020-03-06 DIAGNOSIS — N186 End stage renal disease: Secondary | ICD-10-CM | POA: Diagnosis not present

## 2020-03-06 DIAGNOSIS — Z992 Dependence on renal dialysis: Secondary | ICD-10-CM | POA: Diagnosis not present

## 2020-03-07 ENCOUNTER — Ambulatory Visit: Payer: Medicare Other | Admitting: Cardiovascular Disease

## 2020-03-08 ENCOUNTER — Encounter: Payer: Medicare Other | Admitting: Internal Medicine

## 2020-03-08 DIAGNOSIS — D631 Anemia in chronic kidney disease: Secondary | ICD-10-CM | POA: Diagnosis not present

## 2020-03-08 DIAGNOSIS — E118 Type 2 diabetes mellitus with unspecified complications: Secondary | ICD-10-CM | POA: Diagnosis not present

## 2020-03-08 DIAGNOSIS — N186 End stage renal disease: Secondary | ICD-10-CM | POA: Diagnosis not present

## 2020-03-08 DIAGNOSIS — D509 Iron deficiency anemia, unspecified: Secondary | ICD-10-CM | POA: Diagnosis not present

## 2020-03-08 DIAGNOSIS — N2581 Secondary hyperparathyroidism of renal origin: Secondary | ICD-10-CM | POA: Diagnosis not present

## 2020-03-08 DIAGNOSIS — Z992 Dependence on renal dialysis: Secondary | ICD-10-CM | POA: Diagnosis not present

## 2020-03-10 DIAGNOSIS — D509 Iron deficiency anemia, unspecified: Secondary | ICD-10-CM | POA: Diagnosis not present

## 2020-03-10 DIAGNOSIS — N2581 Secondary hyperparathyroidism of renal origin: Secondary | ICD-10-CM | POA: Diagnosis not present

## 2020-03-10 DIAGNOSIS — Z992 Dependence on renal dialysis: Secondary | ICD-10-CM | POA: Diagnosis not present

## 2020-03-10 DIAGNOSIS — N186 End stage renal disease: Secondary | ICD-10-CM | POA: Diagnosis not present

## 2020-03-10 DIAGNOSIS — E118 Type 2 diabetes mellitus with unspecified complications: Secondary | ICD-10-CM | POA: Diagnosis not present

## 2020-03-10 DIAGNOSIS — D631 Anemia in chronic kidney disease: Secondary | ICD-10-CM | POA: Diagnosis not present

## 2020-03-11 ENCOUNTER — Telehealth: Payer: Medicare Other

## 2020-03-12 DIAGNOSIS — D509 Iron deficiency anemia, unspecified: Secondary | ICD-10-CM | POA: Diagnosis not present

## 2020-03-12 DIAGNOSIS — E118 Type 2 diabetes mellitus with unspecified complications: Secondary | ICD-10-CM | POA: Diagnosis not present

## 2020-03-12 DIAGNOSIS — Z992 Dependence on renal dialysis: Secondary | ICD-10-CM | POA: Diagnosis not present

## 2020-03-12 DIAGNOSIS — N186 End stage renal disease: Secondary | ICD-10-CM | POA: Diagnosis not present

## 2020-03-12 DIAGNOSIS — N2581 Secondary hyperparathyroidism of renal origin: Secondary | ICD-10-CM | POA: Diagnosis not present

## 2020-03-12 DIAGNOSIS — D631 Anemia in chronic kidney disease: Secondary | ICD-10-CM | POA: Diagnosis not present

## 2020-03-15 DIAGNOSIS — E118 Type 2 diabetes mellitus with unspecified complications: Secondary | ICD-10-CM | POA: Diagnosis not present

## 2020-03-15 DIAGNOSIS — D631 Anemia in chronic kidney disease: Secondary | ICD-10-CM | POA: Diagnosis not present

## 2020-03-15 DIAGNOSIS — D509 Iron deficiency anemia, unspecified: Secondary | ICD-10-CM | POA: Diagnosis not present

## 2020-03-15 DIAGNOSIS — N186 End stage renal disease: Secondary | ICD-10-CM | POA: Diagnosis not present

## 2020-03-15 DIAGNOSIS — N2581 Secondary hyperparathyroidism of renal origin: Secondary | ICD-10-CM | POA: Diagnosis not present

## 2020-03-15 DIAGNOSIS — Z992 Dependence on renal dialysis: Secondary | ICD-10-CM | POA: Diagnosis not present

## 2020-03-17 DIAGNOSIS — Z992 Dependence on renal dialysis: Secondary | ICD-10-CM | POA: Diagnosis not present

## 2020-03-17 DIAGNOSIS — D631 Anemia in chronic kidney disease: Secondary | ICD-10-CM | POA: Diagnosis not present

## 2020-03-17 DIAGNOSIS — D509 Iron deficiency anemia, unspecified: Secondary | ICD-10-CM | POA: Diagnosis not present

## 2020-03-17 DIAGNOSIS — N186 End stage renal disease: Secondary | ICD-10-CM | POA: Diagnosis not present

## 2020-03-17 DIAGNOSIS — N2581 Secondary hyperparathyroidism of renal origin: Secondary | ICD-10-CM | POA: Diagnosis not present

## 2020-03-17 DIAGNOSIS — E118 Type 2 diabetes mellitus with unspecified complications: Secondary | ICD-10-CM | POA: Diagnosis not present

## 2020-03-19 DIAGNOSIS — D509 Iron deficiency anemia, unspecified: Secondary | ICD-10-CM | POA: Diagnosis not present

## 2020-03-19 DIAGNOSIS — D631 Anemia in chronic kidney disease: Secondary | ICD-10-CM | POA: Diagnosis not present

## 2020-03-19 DIAGNOSIS — N2581 Secondary hyperparathyroidism of renal origin: Secondary | ICD-10-CM | POA: Diagnosis not present

## 2020-03-19 DIAGNOSIS — E118 Type 2 diabetes mellitus with unspecified complications: Secondary | ICD-10-CM | POA: Diagnosis not present

## 2020-03-19 DIAGNOSIS — Z992 Dependence on renal dialysis: Secondary | ICD-10-CM | POA: Diagnosis not present

## 2020-03-19 DIAGNOSIS — N186 End stage renal disease: Secondary | ICD-10-CM | POA: Diagnosis not present

## 2020-03-22 ENCOUNTER — Other Ambulatory Visit: Payer: Self-pay

## 2020-03-22 ENCOUNTER — Telehealth: Payer: Medicare Other

## 2020-03-22 ENCOUNTER — Ambulatory Visit: Payer: Self-pay

## 2020-03-22 DIAGNOSIS — E1122 Type 2 diabetes mellitus with diabetic chronic kidney disease: Secondary | ICD-10-CM

## 2020-03-22 DIAGNOSIS — N186 End stage renal disease: Secondary | ICD-10-CM

## 2020-03-22 DIAGNOSIS — I350 Nonrheumatic aortic (valve) stenosis: Secondary | ICD-10-CM

## 2020-03-22 DIAGNOSIS — I5032 Chronic diastolic (congestive) heart failure: Secondary | ICD-10-CM

## 2020-03-22 DIAGNOSIS — I10 Essential (primary) hypertension: Secondary | ICD-10-CM

## 2020-03-22 NOTE — Patient Instructions (Signed)
Visit Information Goals    . Monitor and Manage My Blood Sugar-Diabetes Type 2     Timeframe:  Long-Range Goal Priority:  High Start Date:  03/22/20                           Expected End Date: 06/20/20                      Follow Up Date:  04/25/20   -work with the embedded Pharm D for assistance in recommendations for appropriate glucometer -Review and discuss printed DM materials related to Diabetes Management (review with CM at next call)    Why is this important?    Checking your blood sugar at home helps to keep it from getting very high or very low.   Writing the results in a diary or log helps the doctor know how to care for you.   Your blood sugar log should have the time, date and the results.   Also, write down the amount of insulin or other medicine that you take.   Other information, like what you ate, exercise done and how you were feeling, will also be helpful.     Notes:       Patient Care Plan: Diabetes Type 2 (Adult)    Problem Identified: Disease Progression (Diabetes, Type 2)   Priority: High    Long-Range Goal: Disease Progression Prevented or Minimized   Start Date: 03/22/2020  Expected End Date: 06/20/2020  This Visit's Progress: On track  Priority: High  Note:   Objective:  Lab Results  Component Value Date   HGBA1C 7.1 (H) 01/14/2020 .   Lab Results  Component Value Date   CREATININE 8.19 (H) 01/20/2020   CREATININE 7.80 (H) 01/19/2020   CREATININE 7.50 (H) 01/19/2020 .   [name] No results found for: EGFR Current Barriers:  [name] Knowledge Deficits related to basic Diabetes pathophysiology and self care/management . Knowledge Deficits related to medications used for management of diabetes . Does not use cbg meter  . Unable to independently self monitor cbg's . Legally blind Case Manager Clinical Goal(s):  [name] Collaboration with [name], MD regarding development and update of comprehensive plan of care as evidenced by provider attestation and  co-signature . Inter-disciplinary care team collaboration (see longitudinal plan of care) . Over the next 90 days, patient will demonstrate improved adherence to prescribed treatment plan for diabetes self care/management as evidenced by: patient will work with the CCM team to address blood glucose meter needs, improve disease education and understanding of how to manage his Diabetes Interventions:  . Reviewed and discussed patient's A1c has increased to 7.1 . Determined patient is not currently prescribed pharmacological treatment for DM . Determined patient is not currently checking cbg's at home due to blindness, daughter spot checks when able  . Discussed patient's difficulty with using lancet glucometer to self monitor cbg's . Discussed having patient work with embedded Pharm D to address best glucometer option to meet his needs and allow for better monitoring at home . Referral made to pharmacy team for assistance with obtaining a glucometer that will meet patient's needs due to difficulty checking cbg's at home due to blindness  . Review of patient status, including review of consultants reports, relevant laboratory and other test results, and medications completed. . Mail printed DM educational materials related to Diabetes Management for patient and daughter to review and discuss during next CM FU  call  . Discussed plans with patient for ongoing care management follow up and provided patient with direct contact information for care management team Patient Goals/Self-Care Activities . Over the next 90 days, patient will:  -work with the embedded Pharm D for assistance in recommendations for appropriate glucometer -Review and discuss printed DM materials related to Diabetes Management (review with CM at next call)  Follow Up Plan: Telephone follow up appointment with care management team member scheduled for: 04/25/20     The patient verbalized understanding of instructions, educational  materials, and care plan provided today and declined offer to receive copy of patient instructions, educational materials, and care plan.   Telephone follow up appointment with care management team member scheduled for: 04/25/20  [name], RN

## 2020-03-22 NOTE — Addendum Note (Signed)
Addended by: [name] on: 03/22/2020 02:55 PM   Modules accepted: Orders

## 2020-03-22 NOTE — Progress Notes (Signed)
This encounter was created in error - please disregard.

## 2020-03-22 NOTE — Chronic Care Management (AMB) (Signed)
Chronic Care Management   CCM RN Visit Note  03/22/2020 Name: Elijah White MRN: [id number] DOB: 07-26-1940  Subjective: Elijah White is a 80 y.o. year old male who is a primary care patient of Elijah Chard, MD. The care management team was consulted for assistance with disease management and care coordination needs.    Engaged with patient by telephone for follow up visit in response to provider referral for case management and/or care coordination services.   Consent to Services:  The patient was given information about Chronic Care Management services, agreed to services, and gave verbal consent prior to initiation of services.  Please see initial visit note for detailed documentation.   Patient agreed to services and verbal consent obtained.   Assessment: Review of patient past medical history, allergies, medications, health status, including review of consultants reports, laboratory and other test data, was performed as part of comprehensive evaluation and provision of chronic care management services.   SDOH (Social Determinants of Health) assessments and interventions performed:  No  CCM Care Plan  No Known Allergies  Outpatient Encounter Medications as of 03/22/2020  Medication Sig  . acetaminophen (TYLENOL) 500 MG tablet Take 2 tablets (1,000 mg total) by mouth every 6 (six) hours as needed for mild pain, moderate pain or headache.  [name] apraclonidine (IOPIDINE) 0.5 % ophthalmic solution Place 1 drop into the right eye 3 (three) times daily.   [name] atorvastatin (LIPITOR) 10 MG tablet TAKE 1 TABLET BY MOUTH EVERY DAY  . B Complex-C-Folic Acid (DIALYVITE 237) 0.8 MG TABS Take 0.8 mg by mouth daily.   . blood glucose meter kit and supplies KIT Dispense based on patient and insurance preference. Use up to four times daily as directed. (FOR ICD-9 250.00, 250.01). For QAC - HS accuchecks.  . carvedilol (COREG) 3.125 MG tablet TAKE 1 TABLET(3.125 MG) BY MOUTH TWICE DAILY WITH A MEAL   . clopidogrel (PLAVIX) 75 MG tablet Take 1 tablet (75 mg total) by mouth daily with breakfast.  . dorzolamide-timolol (COSOPT) 22.3-6.8 MG/ML ophthalmic solution Place 1 drop into the right eye 2 (two) times daily.  [name] gabapentin (NEURONTIN) 100 MG capsule Take 1 capsule (100 mg total) by mouth at bedtime.  . hydrALAZINE (APRESOLINE) 25 MG tablet TAKE 1 TABLET(25 MG) BY MOUTH TWICE DAILY  . hydroxypropyl methylcellulose / hypromellose (ISOPTO TEARS / GONIOVISC) 2.5 % ophthalmic solution Place 1 drop into the left eye as needed for dry eyes.  . isosorbide mononitrate (IMDUR) 30 MG 24 hr tablet TAKE 1 TABLET(30 MG) BY MOUTH DAILY  . Lancets (ONETOUCH DELICA PLUS [id number]) MISC USE AS DIRECTED UP TO FOUR TIMES A DAY  . latanoprost (XALATAN) 0.005 % ophthalmic solution Place 1 drop into the right eye at bedtime.  [name] test strip USE AS DIRECTED TO TEST FOUR TIMES A DAY  . pantoprazole (PROTONIX) 40 MG tablet Take 1 tablet (40 mg total) by mouth daily at 12 noon.  . pilocarpine (PILOCAR) 4 % ophthalmic solution Place 1 drop into the right eye 4 (four) times daily.    No facility-administered encounter medications on file as of 03/22/2020.    Patient Active Problem List   Diagnosis Date Noted  . 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  . Acute on chronic diastolic heart failure ([place]) 01/19/2020  . 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 RBBB   . Near syncope 12/13/2019  . Severe aortic stenosis   . ESRD (end stage renal disease) ([place]) 11/26/2019  . Macrocytic anemia  11/26/2019  . 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 ARF (acute renal failure) ([place]) 03/25/2018  . Vision loss of right eye 11/11/2017  . Bilateral pseudophakia 07/11/2017  . GERD (gastroesophageal reflux disease) 09/10/2016  . HLD (hyperlipidemia) 09/10/2016  . Essential hypertension 08/13/2016  . 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  . Type 2 diabetes mellitus with renal  manifestations ([place]) 10/22/2006  . BPH (benign prostatic hyperplasia) 10/22/2006  . COLONIC POLYPS, HX OF 10/22/2006    Conditions to be addressed/monitored:Acute on chronic diastolic CHF; DM type 2; Essential hypertension; ESRD on hemodialysis;    Care Plan : Diabetes Type 2 (Adult)  Updates made by [name], RN since 03/22/2020 12:00 AM    Problem: Disease Progression (Diabetes, Type 2)   Priority: High    Long-Range Goal: Disease Progression Prevented or Minimized   Start Date: 03/22/2020  Expected End Date: 06/20/2020  This Visit's Progress: On track  Priority: High  Note:   Objective:  Lab Results  Component Value Date   HGBA1C 7.1 (H) 01/14/2020 .   Lab Results  Component Value Date   CREATININE 8.19 (H) 01/20/2020   CREATININE 7.80 (H) 01/19/2020   CREATININE 7.50 (H) 01/19/2020 .   [name] No results found for: EGFR Current Barriers:  [name] Knowledge Deficits related to basic Diabetes pathophysiology and self care/management . Knowledge Deficits related to medications used for management of diabetes . Does not use cbg meter  . Unable to independently self monitor cbg's . Legally blind Case Manager Clinical Goal(s):  [name] Collaboration with Elijah Chard, MD regarding development and update of comprehensive plan of care as evidenced by provider attestation and co-signature . Inter-disciplinary care team collaboration (see longitudinal plan of care) . Over the next 90 days, patient will demonstrate improved adherence to prescribed treatment plan for diabetes self care/management as evidenced by: patient will work with the CCM team to address blood glucose meter needs, improve disease education and understanding of how to manage his Diabetes Interventions:  . Reviewed and discussed patient's A1c has increased to 7.1 . Determined patient is not currently prescribed pharmacological treatment for DM . Determined patient is not currently checking cbg's at home due to blindness,  daughter spot checks when able  . Discussed patient's difficulty with using lancet glucometer to self monitor cbg's . Discussed having patient work with embedded Pharm D to address best glucometer option to meet his needs and allow for better monitoring at home . Referral made to pharmacy team for assistance with obtaining a glucometer that will meet patient's needs due to difficulty checking cbg's at home due to blindness  . Review of patient status, including review of consultants reports, relevant laboratory and other test results, and medications completed. . Mail printed DM educational materials related to Diabetes Management for patient and daughter to review and discuss during next CM FU call  . Discussed plans with patient for ongoing care management follow up and provided patient with direct contact information for care management team Patient Goals/Self-Care Activities . Over the next 90 days, patient will:  -work with the embedded Pharm D for assistance in recommendations for appropriate glucometer -Review and discuss printed DM materials related to Diabetes Management (review with CM at next call)  Follow Up Plan: Telephone follow up appointment with care management team member scheduled for: 04/25/20     Plan:Telephone follow up appointment with care management team member scheduled for:  04/25/20  Barb Merino, RN, BSN, CCM Care Management Coordinator  [place] [facility]  Direct Phone: [phone]

## 2020-03-24 ENCOUNTER — Telehealth: Payer: Self-pay | Admitting: *Deleted

## 2020-03-24 DIAGNOSIS — D509 Iron deficiency anemia, unspecified: Secondary | ICD-10-CM | POA: Diagnosis not present

## 2020-03-24 DIAGNOSIS — Z992 Dependence on renal dialysis: Secondary | ICD-10-CM | POA: Diagnosis not present

## 2020-03-24 DIAGNOSIS — D631 Anemia in chronic kidney disease: Secondary | ICD-10-CM | POA: Diagnosis not present

## 2020-03-24 DIAGNOSIS — N186 End stage renal disease: Secondary | ICD-10-CM | POA: Diagnosis not present

## 2020-03-24 DIAGNOSIS — E118 Type 2 diabetes mellitus with unspecified complications: Secondary | ICD-10-CM | POA: Diagnosis not present

## 2020-03-24 DIAGNOSIS — N2581 Secondary hyperparathyroidism of renal origin: Secondary | ICD-10-CM | POA: Diagnosis not present

## 2020-03-24 NOTE — Chronic Care Management (AMB) (Signed)
  Chronic Care Management   Note  03/24/2020 Name: WILLE AUBUCHON MRN: [id number] DOB: 1940-12-11  LOVIS MORE is a 80 y.o. year old male who is a primary care patient of [name], MD. KRISTOPH SATTLER is currently enrolled in care management services. An additional referral for Pharmacy was placed.   Follow up plan:  Telephone appointment with care management team member scheduled for:03/30/2020  [place] [facility]

## 2020-03-26 DIAGNOSIS — D631 Anemia in chronic kidney disease: Secondary | ICD-10-CM | POA: Diagnosis not present

## 2020-03-26 DIAGNOSIS — N2581 Secondary hyperparathyroidism of renal origin: Secondary | ICD-10-CM | POA: Diagnosis not present

## 2020-03-26 DIAGNOSIS — E118 Type 2 diabetes mellitus with unspecified complications: Secondary | ICD-10-CM | POA: Diagnosis not present

## 2020-03-26 DIAGNOSIS — D509 Iron deficiency anemia, unspecified: Secondary | ICD-10-CM | POA: Diagnosis not present

## 2020-03-26 DIAGNOSIS — Z992 Dependence on renal dialysis: Secondary | ICD-10-CM | POA: Diagnosis not present

## 2020-03-26 DIAGNOSIS — N186 End stage renal disease: Secondary | ICD-10-CM | POA: Diagnosis not present

## 2020-03-28 ENCOUNTER — Telehealth: Payer: Medicare Other

## 2020-03-28 ENCOUNTER — Telehealth: Payer: Self-pay

## 2020-03-28 NOTE — Telephone Encounter (Cosign Needed)
   03/28/2020  Elijah White 1941/02/08 [id number]    Voice message received from daughter and caregiver [name] stating she is returning the RN CM follow up call she recently missed. The patient was referred to the case management team for assistance with care management and care coordination.   Follow Up Plan: Telephone follow up appointment with care management team member scheduled for: 03/30/20  Barb Merino, RN, BSN, CCM  Care Management Coordinator [place] [facility]  Direct Phone: [phone]

## 2020-03-29 ENCOUNTER — Telehealth: Payer: Self-pay

## 2020-03-29 DIAGNOSIS — D509 Iron deficiency anemia, unspecified: Secondary | ICD-10-CM | POA: Diagnosis not present

## 2020-03-29 DIAGNOSIS — Z992 Dependence on renal dialysis: Secondary | ICD-10-CM | POA: Diagnosis not present

## 2020-03-29 DIAGNOSIS — N2581 Secondary hyperparathyroidism of renal origin: Secondary | ICD-10-CM | POA: Diagnosis not present

## 2020-03-29 DIAGNOSIS — N186 End stage renal disease: Secondary | ICD-10-CM | POA: Diagnosis not present

## 2020-03-29 DIAGNOSIS — D631 Anemia in chronic kidney disease: Secondary | ICD-10-CM | POA: Diagnosis not present

## 2020-03-29 DIAGNOSIS — E118 Type 2 diabetes mellitus with unspecified complications: Secondary | ICD-10-CM | POA: Diagnosis not present

## 2020-03-29 NOTE — Chronic Care Management (AMB) (Signed)
03/29/2020  Called patient to remind him of appointment with Hilma Favors on 03/30/2020 spoke with daughter.  Patient is aware to have all Medications and supplements available at time of appointment  Vallie Pearson,CPP Notified  Judithann Sheen, [place] Pharmacist Assistant [phone]

## 2020-03-30 ENCOUNTER — Ambulatory Visit: Payer: Medicare Other

## 2020-03-30 DIAGNOSIS — N186 End stage renal disease: Secondary | ICD-10-CM

## 2020-03-30 DIAGNOSIS — I130 Hypertensive heart and chronic kidney disease with heart failure and stage 1 through stage 4 chronic kidney disease, or unspecified chronic kidney disease: Secondary | ICD-10-CM

## 2020-03-30 DIAGNOSIS — E1122 Type 2 diabetes mellitus with diabetic chronic kidney disease: Secondary | ICD-10-CM

## 2020-03-30 NOTE — Chronic Care Management (AMB) (Signed)
.[place]  Chronic Care Management Pharmacy  Name: Elijah White  MRN: [id number] DOB: [birth year]    Chief Complaint/ HPI  Elijah White,  80 y.o. , male presents for their Initial CCM visit with the clinical pharmacist via telephone due to COVID-19 Pandemic. Today I am speaking with the daughter Elijah White who takes care of him. She goes to his house at least three times per week to check on him and places all of his medications in a pill box. He is legally blind in left eye and his pin point vision in the right eye. If is he started on CGM he will need a device that will talk to him.   PCP : Elijah Chard, MD  Their chronic conditions include: Hypertension,Diabetes, Anemia, Acute on Chronic Diastolic Heart Failure, GERD, Hyperlipidemia, BPH and ESRD.   Office Visits:  03/06/2020- Atorvastatin 10 mg tablet refill sent  12/09/2019 Telehealth Visit: Chest pain visit 09/02/2019 OV: Patient prefers to have labs drawn at dialysis   Consult Visit: 03/02/20 Cardiology OV: BP 120/42, ECHO completed, Severe AS s/p TAVR: echo today shows EF 60%, normally functioning TAVR with a mean gradient of 8 mm hg and no PVL. He has NYHA class I symptoms. He has amoxicillin for SBE prophylaxis. I discharged him on aspirin and plavix, but pt only taking plavix. Will keep him on monotherapy with plavix x 6 months and then switch to aspirin 81 mg indefinitely. I will see him back in 1 year with echo.   02/24/2020 Podiatry OV: Diabetic foot examination performed today. Advised Elijah White and his daughter to discuss gabapentin dosage with prescriber. There are reasons he may not be able to take an increased dosage and I have asked them to discuss this with the prescriber. They related understanding. Toenails 1-5 b/l were debrided in length and girth with sterile nail nippers and dremel without iatrogenic bleeding to return in 3 months.  Using aspercreme PRN.   02/03/2020 Cardiology OV: Severe AS s/p TAVR: doing great. Groin  sites are healing well. SBE prophylaxis discussed; I have RX'd amoxicillin. Continue aspirin and plavix. I will see him back at the end of the month for echo and follow up.  02/01/2020 Opthalmology OV: Patient to continue current eye drops   01/19/2020 Cardiology Surgery: Transcatheter aortic valve replacement   12/30/2019 Ambulatory Oral Surgery: Multiple teeth extractions with alveoloplasty  ED Visit  12/13/2019- 12/15/2019 ED Hospital Admission  11/26/2019-12/03/19 ED Hospital Admission  CCM Visit 03/22/2020 CCM RN: work with the embedded Pharm D for assistance in recommendations for appropriate glucometer  Medications: Outpatient Encounter Medications as of 03/30/2020  Medication Sig  . acetaminophen (TYLENOL) 500 MG tablet Take 2 tablets (1,000 mg total) by mouth every 6 (six) hours as needed for mild pain, moderate pain or headache.  [name] apraclonidine (IOPIDINE) 0.5 % ophthalmic solution Place 1 drop into the right eye 3 (three) times daily.   [name] atorvastatin (LIPITOR) 10 MG tablet TAKE 1 TABLET BY MOUTH EVERY DAY  . B Complex-C-Folic Acid (DIALYVITE 767) 0.8 MG TABS Take 0.8 mg by mouth daily.   . blood glucose meter kit and supplies KIT Dispense based on patient and insurance preference. Use up to four times daily as directed. (FOR ICD-9 250.00, 250.01). For QAC - HS accuchecks.  . carvedilol (COREG) 3.125 MG tablet TAKE 1 TABLET(3.125 MG) BY MOUTH TWICE DAILY WITH A MEAL  . clopidogrel (PLAVIX) 75 MG tablet Take 1 tablet (75 mg total) by mouth daily with breakfast.  .  dorzolamide-timolol (COSOPT) 22.3-6.8 MG/ML ophthalmic solution Place 1 drop into the right eye 2 (two) times daily.  [name] gabapentin (NEURONTIN) 100 MG capsule Take 1 capsule (100 mg total) by mouth at bedtime.  . hydrALAZINE (APRESOLINE) 25 MG tablet TAKE 1 TABLET(25 MG) BY MOUTH TWICE DAILY  . hydroxypropyl methylcellulose / hypromellose (ISOPTO TEARS / GONIOVISC) 2.5 % ophthalmic solution Place 1 drop into the left eye  as needed for dry eyes.  . isosorbide mononitrate (IMDUR) 30 MG 24 hr tablet TAKE 1 TABLET(30 MG) BY MOUTH DAILY  . Lancets (ONETOUCH DELICA PLUS [id number]) MISC USE AS DIRECTED UP TO FOUR TIMES A DAY  . latanoprost (XALATAN) 0.005 % ophthalmic solution Place 1 drop into the right eye at bedtime.  [name] test strip USE AS DIRECTED TO TEST FOUR TIMES A DAY  . pantoprazole (PROTONIX) 40 MG tablet Take 1 tablet (40 mg total) by mouth daily at 12 noon.  . pilocarpine (PILOCAR) 4 % ophthalmic solution Place 1 drop into the right eye 4 (four) times daily.    No facility-administered encounter medications on file as of 03/30/2020.     Current Diagnosis/Assessment:  Goals Addressed            This Visit's Progress   . Pharmacy Care Plan       CARE PLAN ENTRY (see longitudinal plan of care for additional care plan information)  Current Barriers:  . Chronic Disease Management support, education, and care coordination needs related to Hypertension, Hyperlipidemia, and Diabetes   Hypertension BP Readings from Last 3 Encounters:  03/02/20 (!) 120/42  02/03/20 (!) 138/52  01/20/20 (!) 125/59   . Pharmacist Clinical Goal(s): o Over the next 90 days, patient will work with PharmD and providers to maintain BP goal <130/80 . Current regimen:  o Carvedilol 3.125 mg - take 1 tablet by mouth twice per day with a meal  o Isosorbide 30 mg - take 1 tablet by mouth daily o Hydralazine 25 mg - take 1 tablet by mouth twice daily  . Interventions: o Provided dietary and exercise recommendations o Discussed appropriate goals for blood pressure (less than 130/80) o Recommend patient limit salt consumption in canned foods  . Patient self care activities - Over the next 90 days, patient will: o Continue to have BP checked at dialysis three times per week o Ensure daily salt intake < 2300 mg/day  Hyperlipidemia Lab Results  Component Value Date/Time   LDLCALC 103 09/11/2019 12:00 AM    . Pharmacist Clinical Goal(s): o Over the next 90 days, patient will work with PharmD and providers to achieve LDL goal < 70 . Current regimen:  o Atorvastatin 10 mg taking daily  . Interventions: o Patient to eat a well balanced diet o Continue medication adherence  . Patient self care activities - Over the next 90 days, patient will: o Avoid fried and fatty foods   Diabetes Lab Results  Component Value Date/Time   HGBA1C 7.1 (H) 01/14/2020 08:44 AM   HGBA1C 7.5 (H) 11/26/2019 06:16 PM   HGBA1C 6.7 09/11/2019 12:00 AM   . Pharmacist Clinical Goal(s): o Over the next 90 days, patient will work with PharmD and providers to maintain A1c goal <8% . Current regimen:  o Not currently taking any medication  . Interventions: o Discussed diet and exercise extensively.  o Discussed the use of Dexcom G6 CGM  . Patient self care activities - Over the next 90 days, patient will: o Check blood sugar during  dialysis appointments three days a week document, and provide at future appointments o Contact provider with any episodes of hypoglycemia   Medication management . Pharmacist Clinical Goal(s): o Over the next 90 days, patient will work with PharmD and providers to maintain optimal medication adherence . Current pharmacy: [facility]  . Interventions o Comprehensive medication review performed. o Continue current medication management strategy . Patient self care activities - Over the next 90 days, patient will: o Focus on medication adherence by taking his medication on a consistent basis o Take medications as prescribed o Report any questions or concerns to PharmD and/or provider(s)  Initial goal documentation       Social Determinants of Health with Concerns   Tobacco Use: Medium Risk  . Smoking Tobacco Use: Former Smoker  . Smokeless Tobacco Use: Never Used  [employer]chant [employer] officer: Not on file  Transportation Needs: Not on file  Physical Activity: Not on file   Stress: Not on file  Social Connections: Not on file  Intimate Partner Violence: Not on file  Depression (VQM0-8): Not on file  Alcohol Screen: Not on file  Housing: Not on file     Hypertension   BP Goal:  <130/80  Office blood pressures are  BP Readings from Last 3 Encounters:  03/02/20 (!) 120/42  02/03/20 (!) 138/52  01/20/20 (!) 125/59    Patient has failed these meds in the past:  Patient checks BP at home will discuss at the next office visit  Patient is currently controlled on the following medications: . Hydralazine 25 mg - taking 1 tablet twice daily . Carvedilol 3.125 mg - take twice daily . Isosorbide 30 mg - take daily  We discussed:  Pt's daughter reports that the Valve replacement has brought down his BP   The cardiologist reported that his BP was fine   Pt is taking his medication everyday  Plan  Continue current medications      Diabetes   A1c goal <8%  Recent Relevant Labs: Lab Results  Component Value Date/Time   HGBA1C 7.1 (H) 01/14/2020 08:44 AM   HGBA1C 7.5 (H) 11/26/2019 06:16 PM   HGBA1C 6.7 09/11/2019 12:00 AM   MICROALBUR 150 02/19/2018 04:42 PM    Last diabetic Eye exam:  Lab Results  Component Value Date/Time   HMDIABEYEEXA No Retinopathy 06/18/2011 12:00 AM    Last diabetic Foot exam: 02/24/2020   Checking BG: Never  Patient has failed these meds in past:  Patient is currently controlled on the following medications: . He is not currently taking any medications   We discussed:   CGM- patients daughter wanted to know more about the process of changing the device   Daughter will speak to his father about what he wants to do   Diet and Exercise extensively:  Patients daughter reports that her fathers A1C went up due to tooth removal procedure  Could only eat soft gummy foods like puddings and honey buns so his blood sugar was increased    Plan: Will discuss further with patients family   Continue current  medications  Hyperlipidemia   LDL goal < 70  Last lipids Lab Results  Component Value Date   LDLCALC 103 09/11/2019   Hepatic Function Latest Ref Rng & Units 01/14/2020 12/15/2019 12/01/2019  Total Protein 6.5 - 8.1 g/dL 6.6 6.2(L) -  Albumin 3.5 - 5.0 g/dL 3.6 3.1(L) 3.1(L)  AST 15 - 41 U/L 23 17 -  ALT 0 - 44 U/L 10 15 -  Alk Phosphatase 38 - 126 U/L 91 79 -  Total Bilirubin 0.3 - 1.2 mg/dL 0.6 0.5 -     The ASCVD Risk score ([place]., et al., 2013) failed to calculate for the following reasons:   The 2013 ASCVD risk score is only valid for ages 51 to 34   Patient has failed these meds in past:  Patient is currently uncontrolled on the following medications:  . Atorvastatin 10 mg - take daily   Plan  Will discuss during next office visit   Continue current medications   Heart Failure   Type: Diastolic  Last ejection fraction: 60-65% NYHA Class: I (no actitivty limitation)   BP goal is:  <130/80 BP Readings from Last 3 Encounters:  03/02/20 (!) 120/42  02/03/20 (!) 138/52  01/20/20 (!) 125/59    Patient checks BP at home will discuss at next office visit  Patient home BP readings are ranging: will discuss at next visit  Patient has failed these meds in past: none noted Patient is currently controlled on the following medications:  . Hydralazine 25 mg - taking 1 tablet twice daily . Carvedilol 3.125 mg - take twice daily . Isosorbide 30 mg - take daily . Atorvastatin 10 mg- take daily   We discussed:   weighing daily; if you gain more than 3 pounds in one day or 5 pounds in one week call your doctor.  He used to weight himself until his sight got worse and he was unable to see.   He gets weighed three times a week at dialysis and does not need a scale at home.     Plan  Continue current medications   GERD   Patient has failed these meds in past:  Patient is currently controlled on the following medications:  . Pantoprazole 40 mg - take  daily   We discussed:  Daughter reports patient is doing well with his medication   Plan  Continue current medications    BPH   No results found for: PSA   Patient has failed these meds in past: none noted  Patient is currently controlled on the following medications:  . Not currently on any medications   Will discuss at next office visit.   Plan  Continue control with diet and exercise    Tobacco Abuse   Tobacco Status:  Social History   Tobacco Use  Smoking Status Former Smoker  . Packs/day: 0.50  . Quit date: 03/23/2018  . Years since quitting: 2.0  Smokeless Tobacco Never Used    Patient has quit smoking per daughter.   Patient is currently controlled on the following medications:  . Not taking any medication   We discussed:    Patient has quit smoking.   Plan Patient to continue to be smoke-free.    Vaccines   Reviewed and discussed patient's vaccination history.    Immunization History  Administered Date(s) Administered  . Influenza Whole 01/04/2000  . Influenza, High Dose Seasonal PF 10/28/2018  . Influenza-Unspecified 12/26/2017  . Moderna SARS-COV2 Booster Vaccination 01/15/2020  . Moderna Sars-Covid-2 Vaccination 04/16/2019, 05/14/2019  . Pneumococcal Conjugate-13 04/30/2019  . Pneumococcal Polysaccharide-23 06/27/2019    Plan  Recommended patient receive TdAP vaccine at next office.    Medication Management   Patient's preferred pharmacy is:  [facility] [phone] - [place], [place] - [address] [place] [address] Phone: [phone] Fax: [phone]  Uses pill box? Yes -  set up by daughter.  Pt endorses 100% compliance  We discussed: Discussed benefits of medication synchronization, packaging and delivery as well as enhanced pharmacist oversight with [employer].  Plan  Continue current medication management strategy    Follow up:  3 month  phone visit  Orlando Penner, PharmD Clinical Pharmacist [facility] [phone]

## 2020-03-31 DIAGNOSIS — Z992 Dependence on renal dialysis: Secondary | ICD-10-CM | POA: Diagnosis not present

## 2020-03-31 DIAGNOSIS — D509 Iron deficiency anemia, unspecified: Secondary | ICD-10-CM | POA: Diagnosis not present

## 2020-03-31 DIAGNOSIS — N186 End stage renal disease: Secondary | ICD-10-CM | POA: Diagnosis not present

## 2020-03-31 DIAGNOSIS — N2581 Secondary hyperparathyroidism of renal origin: Secondary | ICD-10-CM | POA: Diagnosis not present

## 2020-03-31 DIAGNOSIS — E118 Type 2 diabetes mellitus with unspecified complications: Secondary | ICD-10-CM | POA: Diagnosis not present

## 2020-03-31 DIAGNOSIS — D631 Anemia in chronic kidney disease: Secondary | ICD-10-CM | POA: Diagnosis not present

## 2020-04-02 DIAGNOSIS — N186 End stage renal disease: Secondary | ICD-10-CM | POA: Diagnosis not present

## 2020-04-02 DIAGNOSIS — E118 Type 2 diabetes mellitus with unspecified complications: Secondary | ICD-10-CM | POA: Diagnosis not present

## 2020-04-02 DIAGNOSIS — N2581 Secondary hyperparathyroidism of renal origin: Secondary | ICD-10-CM | POA: Diagnosis not present

## 2020-04-02 DIAGNOSIS — D631 Anemia in chronic kidney disease: Secondary | ICD-10-CM | POA: Diagnosis not present

## 2020-04-02 DIAGNOSIS — D509 Iron deficiency anemia, unspecified: Secondary | ICD-10-CM | POA: Diagnosis not present

## 2020-04-02 DIAGNOSIS — Z992 Dependence on renal dialysis: Secondary | ICD-10-CM | POA: Diagnosis not present

## 2020-04-04 DIAGNOSIS — I129 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 kidney disease, or unspecified chronic kidney disease: Secondary | ICD-10-CM | POA: Diagnosis not present

## 2020-04-04 DIAGNOSIS — Z992 Dependence on renal dialysis: Secondary | ICD-10-CM | POA: Diagnosis not present

## 2020-04-04 DIAGNOSIS — N186 End stage renal disease: Secondary | ICD-10-CM | POA: Diagnosis not present

## 2020-04-05 DIAGNOSIS — N186 End stage renal disease: Secondary | ICD-10-CM | POA: Diagnosis not present

## 2020-04-05 DIAGNOSIS — D631 Anemia in chronic kidney disease: Secondary | ICD-10-CM | POA: Diagnosis not present

## 2020-04-05 DIAGNOSIS — Z992 Dependence on renal dialysis: Secondary | ICD-10-CM | POA: Diagnosis not present

## 2020-04-05 DIAGNOSIS — N2581 Secondary hyperparathyroidism of renal origin: Secondary | ICD-10-CM | POA: Diagnosis not present

## 2020-04-06 NOTE — Patient Instructions (Addendum)
Visit Information  Goals Addressed            This Visit's Progress   . Pharmacy Care Plan       CARE PLAN ENTRY (see longitudinal plan of care for additional care plan information)  Current Barriers:  . Chronic Disease Management support, education, and care coordination needs related to Hypertension, Hyperlipidemia, and Diabetes   Hypertension BP Readings from Last 3 Encounters:  03/02/20 (!) 120/42  02/03/20 (!) 138/52  01/20/20 (!) 125/59   . Pharmacist Clinical Goal(s): o Over the next 90 days, patient will work with PharmD and providers to maintain BP goal <130/80 . Current regimen:  o Carvedilol 3.125 mg - take 1 tablet by mouth twice per day with a meal  o Isosorbide 30 mg - take 1 tablet by mouth daily o Hydralazine 25 mg - take 1 tablet by mouth twice daily  . Interventions: o Provided dietary and exercise recommendations o Discussed appropriate goals for blood pressure (less than 130/80) o Recommend patient limit salt consumption in canned foods  . Patient self care activities - Over the next 90 days, patient will: o Continue to have BP checked at dialysis three times per week o Ensure daily salt intake < 2300 mg/day  Hyperlipidemia Lab Results  Component Value Date/Time   LDLCALC 103 09/11/2019 12:00 AM   . Pharmacist Clinical Goal(s): o Over the next 90 days, patient will work with PharmD and providers to achieve LDL goal < 70 . Current regimen:  o Atorvastatin 10 mg taking daily  . Interventions: o Patient to eat a well balanced diet o Continue medication adherence  . Patient self care activities - Over the next 90 days, patient will: o Avoid fried and fatty foods   Diabetes Lab Results  Component Value Date/Time   HGBA1C 7.1 (H) 01/14/2020 08:44 AM   HGBA1C 7.5 (H) 11/26/2019 06:16 PM   HGBA1C 6.7 09/11/2019 12:00 AM   . Pharmacist Clinical Goal(s): o Over the next 90 days, patient will work with PharmD and providers to maintain A1c goal  <8% . Current regimen:  o Not currently taking any medication  . Interventions: o Discussed diet and exercise extensively.  o Discussed the use of Dexcom G6 CGM  . Patient self care activities - Over the next 90 days, patient will: o Check blood sugar during dialysis appointments three days a week document, and provide at future appointments o Contact provider with any episodes of hypoglycemia   Medication management . Pharmacist Clinical Goal(s): o Over the next 90 days, patient will work with PharmD and providers to maintain optimal medication adherence . Current pharmacy: [facility]  . Interventions o Comprehensive medication review performed. o Continue current medication management strategy . Patient self care activities - Over the next 90 days, patient will: o Focus on medication adherence by taking his medication on a consistent basis o Take medications as prescribed o Report any questions or concerns to PharmD and/or provider(s)  Initial goal documentation        Elijah White was given information about Chronic Care Management services today including:  1. CCM service includes personalized support from designated clinical staff supervised by his physician, including individualized plan of care and coordination with other care providers 2. 24/7 contact phone numbers for assistance for urgent and routine care needs. 3. Standard insurance, coinsurance, copays and deductibles apply for chronic care management only during months in which we provide at least 20 minutes of these services. Most insurances cover  these services at 100%, however patients may be responsible for any copay, coinsurance and/or deductible if applicable. This service may help you avoid the need for more expensive face-to-face services. 4. Only one practitioner may furnish and bill the service in a calendar month. 5. The patient may stop CCM services at any time (effective at the end of the month) by phone call  to the office staff.  Patient agreed to services and verbal consent obtained.   The patient verbalized understanding of instructions, educational materials, and care plan provided today and agreed to receive a mailed copy of patient instructions, educational materials, and care plan.  The pharmacy team will reach out to the patient again over the next 90 days.   Elijah White, [facility]

## 2020-04-07 DIAGNOSIS — Z992 Dependence on renal dialysis: Secondary | ICD-10-CM | POA: Diagnosis not present

## 2020-04-07 DIAGNOSIS — N186 End stage renal disease: Secondary | ICD-10-CM | POA: Diagnosis not present

## 2020-04-07 DIAGNOSIS — N2581 Secondary hyperparathyroidism of renal origin: Secondary | ICD-10-CM | POA: Diagnosis not present

## 2020-04-07 DIAGNOSIS — D631 Anemia in chronic kidney disease: Secondary | ICD-10-CM | POA: Diagnosis not present

## 2020-04-09 DIAGNOSIS — D631 Anemia in chronic kidney disease: Secondary | ICD-10-CM | POA: Diagnosis not present

## 2020-04-09 DIAGNOSIS — Z992 Dependence on renal dialysis: Secondary | ICD-10-CM | POA: Diagnosis not present

## 2020-04-09 DIAGNOSIS — N186 End stage renal disease: Secondary | ICD-10-CM | POA: Diagnosis not present

## 2020-04-09 DIAGNOSIS — N2581 Secondary hyperparathyroidism of renal origin: Secondary | ICD-10-CM | POA: Diagnosis not present

## 2020-04-12 DIAGNOSIS — N186 End stage renal disease: Secondary | ICD-10-CM | POA: Diagnosis not present

## 2020-04-12 DIAGNOSIS — N2581 Secondary hyperparathyroidism of renal origin: Secondary | ICD-10-CM | POA: Diagnosis not present

## 2020-04-12 DIAGNOSIS — D631 Anemia in chronic kidney disease: Secondary | ICD-10-CM | POA: Diagnosis not present

## 2020-04-12 DIAGNOSIS — Z992 Dependence on renal dialysis: Secondary | ICD-10-CM | POA: Diagnosis not present

## 2020-04-14 ENCOUNTER — Ambulatory Visit: Payer: Medicare Other

## 2020-04-14 DIAGNOSIS — N186 End stage renal disease: Secondary | ICD-10-CM

## 2020-04-14 DIAGNOSIS — Z992 Dependence on renal dialysis: Secondary | ICD-10-CM | POA: Diagnosis not present

## 2020-04-14 DIAGNOSIS — D631 Anemia in chronic kidney disease: Secondary | ICD-10-CM | POA: Diagnosis not present

## 2020-04-14 DIAGNOSIS — N2581 Secondary hyperparathyroidism of renal origin: Secondary | ICD-10-CM | POA: Diagnosis not present

## 2020-04-14 NOTE — Chronic Care Management (AMB) (Signed)
Chronic Care Management    Social Work Note  04/14/2020 Name: Elijah White MRN: [id number] DOB: 1941/01/21  Elijah White is a 80 y.o. year old male who is a primary care patient of Elijah Chard, MD. The CCM team was consulted to assist the patient with chronic disease management and/or care coordination needs related to: [employer] .   Engaged with Elijah White by telephone for follow up visit in response to provider referral for social work chronic care management and care coordination services.   Consent to Services:  The patient was given information about Chronic Care Management services, agreed to services, and gave verbal consent prior to initiation of services.  Please see initial visit note for detailed documentation.   Patient agreed to services and consent obtained.   Assessment: Review of patient past medical history, allergies, medications, and health status, including review of relevant consultants reports was performed today as part of a comprehensive evaluation and provision of chronic care management and care coordination services.     SDOH (Social Determinants of Health) assessments and interventions performed:    Advanced Directives Status: Not addressed in this encounter.  CCM Care Plan  No Known Allergies  Outpatient Encounter Medications as of 04/14/2020  Medication Sig  . acetaminophen (TYLENOL) 500 MG tablet Take 2 tablets (1,000 mg total) by mouth every 6 (six) hours as needed for mild pain, moderate pain or headache.  [name] apraclonidine (IOPIDINE) 0.5 % ophthalmic solution Place 1 drop into the right eye 3 (three) times daily.   [name] atorvastatin (LIPITOR) 10 MG tablet TAKE 1 TABLET BY MOUTH EVERY DAY  . B Complex-C-Folic Acid (DIALYVITE 530) 0.8 MG TABS Take 0.8 mg by mouth daily.   . blood glucose meter kit and supplies KIT Dispense based on patient and insurance preference. Use up to four times daily as directed. (FOR ICD-9 250.00, 250.01). For QAC  - HS accuchecks. (Patient not taking: Reported on 03/30/2020)  . carvedilol (COREG) 3.125 MG tablet TAKE 1 TABLET(3.125 MG) BY MOUTH TWICE DAILY WITH A MEAL  . clopidogrel (PLAVIX) 75 MG tablet Take 1 tablet (75 mg total) by mouth daily with breakfast.  . dorzolamide-timolol (COSOPT) 22.3-6.8 MG/ML ophthalmic solution Place 1 drop into the right eye 2 (two) times daily.  [name] gabapentin (NEURONTIN) 100 MG capsule Take 1 capsule (100 mg total) by mouth at bedtime.  . hydrALAZINE (APRESOLINE) 25 MG tablet TAKE 1 TABLET(25 MG) BY MOUTH TWICE DAILY  . hydroxypropyl methylcellulose / hypromellose (ISOPTO TEARS / GONIOVISC) 2.5 % ophthalmic solution Place 1 drop into the left eye as needed for dry eyes.  . isosorbide mononitrate (IMDUR) 30 MG 24 hr tablet TAKE 1 TABLET(30 MG) BY MOUTH DAILY  . Lancets (ONETOUCH DELICA PLUS [id number]) MISC USE AS DIRECTED UP TO FOUR TIMES A DAY (Patient not taking: Reported on 03/30/2020)  . latanoprost (XALATAN) 0.005 % ophthalmic solution Place 1 drop into the right eye at bedtime.  [name] test strip USE AS DIRECTED TO TEST FOUR TIMES A DAY (Patient not taking: Reported on 03/30/2020)  . pantoprazole (PROTONIX) 40 MG tablet Take 1 tablet (40 mg total) by mouth daily at 12 noon.  . pilocarpine (PILOCAR) 4 % ophthalmic solution Place 1 drop into the right eye 4 (four) times daily.    No facility-administered encounter medications on file as of 04/14/2020.    Patient Active Problem List   Diagnosis Date Noted  . 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  . Acute on chronic diastolic heart failure (  [place]) 01/19/2020  . 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 RBBB   . Near syncope 12/13/2019  . Severe aortic stenosis   . ESRD (end stage renal disease) ([place]) 11/26/2019  . Macrocytic anemia 11/26/2019  . 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 ARF (acute renal failure) ([place]) 03/25/2018  . Vision loss of right eye 11/11/2017  . Bilateral  pseudophakia 07/11/2017  . GERD (gastroesophageal reflux disease) 09/10/2016  . HLD (hyperlipidemia) 09/10/2016  . Essential hypertension 08/13/2016  . 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  . Type 2 diabetes mellitus with renal manifestations ([place]) 10/22/2006  . BPH (benign prostatic hyperplasia) 10/22/2006  . COLONIC POLYPS, HX OF 10/22/2006    Conditions to be addressed/monitored: DMII and ESRD; care coordination  Care Plan : Social Work [place]  Updates made by [name] since 04/14/2020 12:00 AM    Problem: Disease Progression     Long-Range Goal: Disease Progression Prevented or Minimized   Start Date: 04/14/2020  Expected End Date: 08/12/2020  Priority: Low  Note:   Current Barriers:  . Chronic disease management support and education needs related to DM and ESRD  . ADL IADL limitations  Social Worker Clinical Goal(s):  [name] Over the next 120 days, patient will work with SW to identify and address any acute and/or chronic care coordination needs related to the self health management of DM and ESRD   CCM SW Interventions:  . Inter-disciplinary care team collaboration (see longitudinal plan of care) . Collaboration with Elijah Chard, MD regarding development and update of comprehensive plan of care as evidenced by provider attestation and co-signature . Successful outbound call placed to patients daughter Elijah White to assess for care coordination needs . Discussed the patient continues to do well with Dialysis  . Elijah White reports the patients vision continues to decline, patient remains in the home and manages well at this point . Determined the patient remains active with Remote Health . Discussed long term follow up with SW while patient remains actively involved with  RN Case Manager  to address care management needs . Scheduled follow up call over the next 90 days Patient Goals/Self-Care Activities . Over the next 90 days, patient will:   - Patient will self  administer medications as prescribed Patient will attend all scheduled provider appointments Patient will call provider office for new concerns or questions Contact SW as needed prior to next scheduled call Follow Up Plan:  SW will follow up with the patient over the next 90 days       Follow Up Plan: SW will follow up with patient by phone over the next 90 days      [name], BSW, CDP Social Worker, Certified Dementia Practitioner [place] [facility] [place] [facility] [phone]  Total time spent performing care coordination and/or care management activities with the patient by phone or face to face = 15 minutes.

## 2020-04-14 NOTE — Patient Instructions (Signed)
Goals we discussed today:  Goals Addressed    . Work with SW to manage care coordination needs       Timeframe:  Long-Range Goal Priority:  Low Start Date: 2.10.22                            Expected End Date: 6.10.22                      Next planned outreach date: 4.28.22  Patient Goals/Self-Care Activities . Over the next 90 days, patient will:   - Patient will self administer medications as prescribed Patient will attend all scheduled provider appointments Patient will call provider office for new concerns or questions Contact SW as needed prior to next scheduled call

## 2020-04-16 DIAGNOSIS — D631 Anemia in chronic kidney disease: Secondary | ICD-10-CM | POA: Diagnosis not present

## 2020-04-16 DIAGNOSIS — N186 End stage renal disease: Secondary | ICD-10-CM | POA: Diagnosis not present

## 2020-04-16 DIAGNOSIS — N2581 Secondary hyperparathyroidism of renal origin: Secondary | ICD-10-CM | POA: Diagnosis not present

## 2020-04-16 DIAGNOSIS — Z992 Dependence on renal dialysis: Secondary | ICD-10-CM | POA: Diagnosis not present

## 2020-04-19 DIAGNOSIS — N186 End stage renal disease: Secondary | ICD-10-CM | POA: Diagnosis not present

## 2020-04-19 DIAGNOSIS — Z992 Dependence on renal dialysis: Secondary | ICD-10-CM | POA: Diagnosis not present

## 2020-04-19 DIAGNOSIS — N2581 Secondary hyperparathyroidism of renal origin: Secondary | ICD-10-CM | POA: Diagnosis not present

## 2020-04-19 DIAGNOSIS — D631 Anemia in chronic kidney disease: Secondary | ICD-10-CM | POA: Diagnosis not present

## 2020-04-20 ENCOUNTER — Ambulatory Visit (INDEPENDENT_AMBULATORY_CARE_PROVIDER_SITE_OTHER): Payer: Medicare Other

## 2020-04-20 DIAGNOSIS — N186 End stage renal disease: Secondary | ICD-10-CM

## 2020-04-20 DIAGNOSIS — E1122 Type 2 diabetes mellitus with diabetic chronic kidney disease: Secondary | ICD-10-CM

## 2020-04-20 NOTE — Chronic Care Management (AMB) (Signed)
Chronic Care Management    Social Work Note  04/20/2020 Name: Elijah White MRN: [id number] DOB: [birth year]  Elijah White is a 80 y.o. year old male who is a primary care patient of [name], MD. The CCM team was consulted to assist the patient with chronic disease management and/or care coordination needs related to: [employer] .   Engaged with patient daughter/caregiver by phone for follow up visit in response to provider referral for social work chronic care management and care coordination services.   Consent to Services:  The patient was given information about Chronic Care Management services, agreed to services, and gave verbal consent prior to initiation of services.  Please see initial visit note for detailed documentation.   Patient agreed to services and consent obtained.   Assessment: Review of patient past medical history, allergies, medications, and health status, including review of relevant consultants reports was performed today as part of a comprehensive evaluation and provision of chronic care management and care coordination services.     SDOH (Social Determinants of Health) assessments and interventions performed:    Advanced Directives Status: Not addressed in this encounter.  CCM Care Plan  No Known Allergies  Outpatient Encounter Medications as of 04/20/2020  Medication Sig  . acetaminophen (TYLENOL) 500 MG tablet Take 2 tablets (1,000 mg total) by mouth every 6 (six) hours as needed for mild pain, moderate pain or headache.  [name] apraclonidine (IOPIDINE) 0.5 % ophthalmic solution Place 1 drop into the right eye 3 (three) times daily.   [name] atorvastatin (LIPITOR) 10 MG tablet TAKE 1 TABLET BY MOUTH EVERY DAY  . B Complex-C-Folic Acid (DIALYVITE 176) 0.8 MG TABS Take 0.8 mg by mouth daily.   . blood glucose meter kit and supplies KIT Dispense based on patient and insurance preference. Use up to four times daily as directed. (FOR ICD-9 250.00,  250.01). For QAC - HS accuchecks. (Patient not taking: Reported on 03/30/2020)  . carvedilol (COREG) 3.125 MG tablet TAKE 1 TABLET(3.125 MG) BY MOUTH TWICE DAILY WITH A MEAL  . clopidogrel (PLAVIX) 75 MG tablet Take 1 tablet (75 mg total) by mouth daily with breakfast.  . dorzolamide-timolol (COSOPT) 22.3-6.8 MG/ML ophthalmic solution Place 1 drop into the right eye 2 (two) times daily.  [name] gabapentin (NEURONTIN) 100 MG capsule Take 1 capsule (100 mg total) by mouth at bedtime.  . hydrALAZINE (APRESOLINE) 25 MG tablet TAKE 1 TABLET(25 MG) BY MOUTH TWICE DAILY  . hydroxypropyl methylcellulose / hypromellose (ISOPTO TEARS / GONIOVISC) 2.5 % ophthalmic solution Place 1 drop into the left eye as needed for dry eyes.  . isosorbide mononitrate (IMDUR) 30 MG 24 hr tablet TAKE 1 TABLET(30 MG) BY MOUTH DAILY  . Lancets (ONETOUCH DELICA PLUS [id number]) MISC USE AS DIRECTED UP TO FOUR TIMES A DAY (Patient not taking: Reported on 03/30/2020)  . latanoprost (XALATAN) 0.005 % ophthalmic solution Place 1 drop into the right eye at bedtime.  [name] test strip USE AS DIRECTED TO TEST FOUR TIMES A DAY (Patient not taking: Reported on 03/30/2020)  . pantoprazole (PROTONIX) 40 MG tablet Take 1 tablet (40 mg total) by mouth daily at 12 noon.  . pilocarpine (PILOCAR) 4 % ophthalmic solution Place 1 drop into the right eye 4 (four) times daily.    No facility-administered encounter medications on file as of 04/20/2020.    Patient Active Problem List   Diagnosis Date Noted  . 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  . Acute on chronic diastolic heart failure (  [place]) 01/19/2020  . 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 RBBB   . Near syncope 12/13/2019  . Severe aortic stenosis   . ESRD (end stage renal disease) ([place]) 11/26/2019  . Macrocytic anemia 11/26/2019  . 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 ARF (acute renal failure) ([place]) 03/25/2018  . Vision loss of right eye 11/11/2017   . Bilateral pseudophakia 07/11/2017  . GERD (gastroesophageal reflux disease) 09/10/2016  . HLD (hyperlipidemia) 09/10/2016  . Essential hypertension 08/13/2016  . 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  . Type 2 diabetes mellitus with renal manifestations ([place]) 10/22/2006  . BPH (benign prostatic hyperplasia) 10/22/2006  . COLONIC POLYPS, HX OF 10/22/2006    Conditions to be addressed/monitored: DMII and ESRD; Transportation and Level of care concerns  Care Plan : Social Work [facility] Care Plan  Updates made by [name] since 04/20/2020 12:00 AM    Problem: Disease Progression     Long-Range Goal: Disease Progression Prevented or Minimized   Start Date: 04/14/2020  Expected End Date: 08/12/2020  Priority: Low  Note:   Current Barriers:  . Chronic disease management support and education needs related to DM and ESRD  . ADL IADL limitations  Social Worker Clinical Goal(s):  [name] Over the next 120 days, patient will work with SW to identify and address any acute and/or chronic care coordination needs related to the self health management of DM and ESRD   CCM SW Interventions:  . Inter-disciplinary care team collaboration (see longitudinal plan of care) . Collaboration with [name], MD regarding development and update of comprehensive plan of care as evidenced by provider attestation and co-signature . Inbound call received from patients daughter [name] who indicates the patient was contact on Friday by someone who stated he was approved for Medicaid effective February 1st . Determined [name] has contacted the patients Medicaid case worker who reports she has not  received communication indicating he has been approved . Discussed possibility patient received a scam call . Rosamaria Lints to contact caseworker within the next week to verify there are no updates in the system . Discussed [name] may go out of town in the next month and is looking into transportation options to  assist the patient to and from dialysis . Reviewed the patient is eligible to access [employer] . Provided [name] with reservation number to schedule patient transportation via [employer] . Scheduled follow up call over the next 90 days  Patient Goals/Self-Care Activities . Over the next 90 days, patient will:   - Patient will self administer medications as prescribed Patient will attend all scheduled provider appointments Patient will call provider office for new concerns or questions Contact SW as needed prior to next scheduled call Follow Up Plan:  SW will follow up with the patient over the next 90 days       Follow Up Plan: SW will follow up with patient by phone over the next 90 days.      [name], BSW, CDP Social Worker, Certified Dementia Practitioner [place] [facility] [place] [facility] [phone]  Total time spent performing care coordination and/or care management activities with the patient by phone or face to face = 20 minutes.

## 2020-04-20 NOTE — Patient Instructions (Signed)
  Goals we discussed today:  Goals Addressed            This Visit's Progress   . Work with SW to manage care coordination needs       Timeframe:  Long-Range Goal Priority:  Low Start Date: 2.10.22                            Expected End Date: 6.10.22                      Next planned outreach date: 4.28.22  Patient Goals/Self-Care Activities . Over the next 90 days, patient will:   - Patient will self administer medications as prescribed -Patient will attend all scheduled provider appointments -Patient will call provider office for new concerns or questions -Contact SW as needed prior to next scheduled call

## 2020-04-21 DIAGNOSIS — N2581 Secondary hyperparathyroidism of renal origin: Secondary | ICD-10-CM | POA: Diagnosis not present

## 2020-04-21 DIAGNOSIS — Z992 Dependence on renal dialysis: Secondary | ICD-10-CM | POA: Diagnosis not present

## 2020-04-21 DIAGNOSIS — D631 Anemia in chronic kidney disease: Secondary | ICD-10-CM | POA: Diagnosis not present

## 2020-04-21 DIAGNOSIS — N186 End stage renal disease: Secondary | ICD-10-CM | POA: Diagnosis not present

## 2020-04-23 DIAGNOSIS — N186 End stage renal disease: Secondary | ICD-10-CM | POA: Diagnosis not present

## 2020-04-23 DIAGNOSIS — D631 Anemia in chronic kidney disease: Secondary | ICD-10-CM | POA: Diagnosis not present

## 2020-04-23 DIAGNOSIS — N2581 Secondary hyperparathyroidism of renal origin: Secondary | ICD-10-CM | POA: Diagnosis not present

## 2020-04-23 DIAGNOSIS — Z992 Dependence on renal dialysis: Secondary | ICD-10-CM | POA: Diagnosis not present

## 2020-04-25 ENCOUNTER — Ambulatory Visit: Payer: Self-pay

## 2020-04-25 ENCOUNTER — Telehealth: Payer: Medicare Other

## 2020-04-25 DIAGNOSIS — N186 End stage renal disease: Secondary | ICD-10-CM

## 2020-04-25 DIAGNOSIS — I10 Essential (primary) hypertension: Secondary | ICD-10-CM

## 2020-04-25 DIAGNOSIS — I5032 Chronic diastolic (congestive) heart failure: Secondary | ICD-10-CM | POA: Diagnosis not present

## 2020-04-25 DIAGNOSIS — I350 Nonrheumatic aortic (valve) stenosis: Secondary | ICD-10-CM

## 2020-04-25 DIAGNOSIS — E1122 Type 2 diabetes mellitus with diabetic chronic kidney disease: Secondary | ICD-10-CM

## 2020-04-25 DIAGNOSIS — Z992 Dependence on renal dialysis: Secondary | ICD-10-CM | POA: Diagnosis not present

## 2020-04-26 DIAGNOSIS — D631 Anemia in chronic kidney disease: Secondary | ICD-10-CM | POA: Diagnosis not present

## 2020-04-26 DIAGNOSIS — N186 End stage renal disease: Secondary | ICD-10-CM | POA: Diagnosis not present

## 2020-04-26 DIAGNOSIS — Z992 Dependence on renal dialysis: Secondary | ICD-10-CM | POA: Diagnosis not present

## 2020-04-26 DIAGNOSIS — N2581 Secondary hyperparathyroidism of renal origin: Secondary | ICD-10-CM | POA: Diagnosis not present

## 2020-04-27 NOTE — Patient Instructions (Signed)
Goals Addressed    . Monitor and Manage My Blood Sugar-Diabetes Type 2   Not on track    Timeframe:  Long-Range Goal Priority:  High Start Date:  03/22/20                           Expected End Date: 09/19/20                      Follow Up Date: 06/03/20   -work with the embedded Pharm D for assistance in recommendations for appropriate glucometer -Review and discuss printed DM materials related to Diabetes Management (review with CM at next call)  -adhere to dietary recommendations   Why is this important?    Checking your blood sugar at home helps to keep it from getting very high or very low.   Writing the results in a diary or log helps the doctor know how to care for you.   Your blood sugar log should have the time, date and the results.   Also, write down the amount of insulin or other medicine that you take.   Other information, like what you ate, exercise done and how you were feeling, will also be helpful.     Notes:

## 2020-04-27 NOTE — Chronic Care Management (AMB) (Signed)
Chronic Care Management   CCM RN Visit Note  04/25/2020 Name: Elijah White MRN: [id number] DOB: [birth year]  Subjective: Elijah White is a 80 y.o. year old male who is a primary care patient of [name], MD. The care management team was consulted for assistance with disease management and care coordination needs.    Engaged with patient by telephone for follow up visit in response to provider referral for case management and/or care coordination services.   Consent to Services:  The patient was given information about Chronic Care Management services, agreed to services, and gave verbal consent prior to initiation of services.  Please see initial visit note for detailed documentation.   Patient agreed to services and verbal consent obtained.   Assessment: Review of patient past medical history, allergies, medications, health status, including review of consultants reports, laboratory and other test data, was performed as part of comprehensive evaluation and provision of chronic care management services.   SDOH (Social Determinants of Health) assessments and interventions performed:  Yes, no new acute challenges identified   CCM Care Plan  No Known Allergies  Outpatient Encounter Medications as of 04/25/2020  Medication Sig  . acetaminophen (TYLENOL) 500 MG tablet Take 2 tablets (1,000 mg total) by mouth every 6 (six) hours as needed for mild pain, moderate pain or headache.  [name] apraclonidine (IOPIDINE) 0.5 % ophthalmic solution Place 1 drop into the right eye 3 (three) times daily.   [name] atorvastatin (LIPITOR) 10 MG tablet TAKE 1 TABLET BY MOUTH EVERY DAY  . B Complex-C-Folic Acid (DIALYVITE 124) 0.8 MG TABS Take 0.8 mg by mouth daily.   . blood glucose meter kit and supplies KIT Dispense based on patient and insurance preference. Use up to four times daily as directed. (FOR ICD-9 250.00, 250.01). For QAC - HS accuchecks. (Patient not taking: Reported on 03/30/2020)  . carvedilol  (COREG) 3.125 MG tablet TAKE 1 TABLET(3.125 MG) BY MOUTH TWICE DAILY WITH A MEAL  . clopidogrel (PLAVIX) 75 MG tablet Take 1 tablet (75 mg total) by mouth daily with breakfast.  . dorzolamide-timolol (COSOPT) 22.3-6.8 MG/ML ophthalmic solution Place 1 drop into the right eye 2 (two) times daily.  [name] gabapentin (NEURONTIN) 100 MG capsule Take 1 capsule (100 mg total) by mouth at bedtime.  . hydrALAZINE (APRESOLINE) 25 MG tablet TAKE 1 TABLET(25 MG) BY MOUTH TWICE DAILY  . hydroxypropyl methylcellulose / hypromellose (ISOPTO TEARS / GONIOVISC) 2.5 % ophthalmic solution Place 1 drop into the left eye as needed for dry eyes.  . isosorbide mononitrate (IMDUR) 30 MG 24 hr tablet TAKE 1 TABLET(30 MG) BY MOUTH DAILY  . Lancets (ONETOUCH DELICA PLUS [id number]) MISC USE AS DIRECTED UP TO FOUR TIMES A DAY (Patient not taking: Reported on 03/30/2020)  . latanoprost (XALATAN) 0.005 % ophthalmic solution Place 1 drop into the right eye at bedtime.  [name] test strip USE AS DIRECTED TO TEST FOUR TIMES A DAY (Patient not taking: Reported on 03/30/2020)  . pantoprazole (PROTONIX) 40 MG tablet Take 1 tablet (40 mg total) by mouth daily at 12 noon.  . pilocarpine (PILOCAR) 4 % ophthalmic solution Place 1 drop into the right eye 4 (four) times daily.    No facility-administered encounter medications on file as of 04/25/2020.    Patient Active Problem List   Diagnosis Date Noted  . 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  . Acute on chronic diastolic heart failure ([place]) 01/19/2020  . 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 RBBB   .  Near syncope 12/13/2019  . Severe aortic stenosis   . ESRD (end stage renal disease) ([place]) 11/26/2019  . Macrocytic anemia 11/26/2019  . Adrenal mass ([place]) 11/26/2019  . 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 ARF (acute renal failure) ([place]) 03/25/2018  . Vision loss of right eye 11/11/2017  . Bilateral pseudophakia 07/11/2017  . GERD (gastroesophageal reflux disease) 09/10/2016   . HLD (hyperlipidemia) 09/10/2016  . Essential hypertension 08/13/2016  . 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  . Type 2 diabetes mellitus with renal manifestations ([place]) 10/22/2006  . BPH (benign prostatic hyperplasia) 10/22/2006  . COLONIC POLYPS, HX OF 10/22/2006    Conditions to be addressed/monitored:Acute on chronic diastolic CHF; DM type 2; Essential hypertension; ESRD on hemodialysis; Glaucoma, Severe Aortic Stenosis  Care Plan : Diabetes Type 2 (Adult)  Updates made by [name], RN since 04/27/2020 12:00 AM    Problem: Disease Progression (Diabetes, Type 2)   Priority: High    Long-Range Goal: Disease Progression Prevented or Minimized   Start Date: 03/22/2020  Expected End Date: 09/19/2020  Recent Progress: On track  Priority: High  Note:   Objective:  Lab Results  Component Value Date   HGBA1C 7.1 (H) 01/14/2020 .   Lab Results  Component Value Date   CREATININE 8.19 (H) 01/20/2020   CREATININE 7.80 (H) 01/19/2020   CREATININE 7.50 (H) 01/19/2020 .   [name] No results found for: EGFR Current Barriers:  [name] Knowledge Deficits related to basic Diabetes pathophysiology and self care/management . Knowledge Deficits related to medications used for management of diabetes . Does not use cbg meter  . Unable to independently self monitor cbg's . Legally blind Case Manager Clinical Goal(s):  [name] Collaboration with [name], MD regarding development and update of comprehensive plan of care as evidenced by provider attestation and co-signature . Inter-disciplinary care team collaboration (see longitudinal plan of care) . Over the next 180 days, patient will demonstrate improved adherence to prescribed treatment plan for diabetes self care/management as evidenced by: patient will work with the CCM team to address blood glucose meter needs, improve disease education and understanding of how to manage his Diabetes Interventions:  04/25/20 . Review of patient status,  including review of consultants reports, relevant laboratory and other test results, and medications completed; Reviewed and discussed patient's A1c has increased to 7.1 . Determined patient is not currently prescribed pharmacological treatment for DM . Determined patient is not currently checking cbg's at home due to blindness, daughter spot checks when able  . Discussed patient's difficulty with using lancet glucometer to self monitor cbg's . Discussed having patient work with embedded Pharm D to address best glucometer option to meet his needs and allow for better monitoring at home . Sent in basket message to Pharm D requesting status update regarding assistance with obtaining a glucometer that will allow patient to self monitor his CBG's at home . Educated on DM dietary recommendations  . Mailed printed DM educational materials related to Diabetes Management for patient and daughter to review and discuss during next CM FU call  . Discussed plans with patient for ongoing care management follow up and provided patient with direct contact information for care management team Patient Goals/Self-Care Activities . Over the next 180 days, patient will:  -work with the embedded Pharm D for assistance in recommendations for appropriate glucometer -Review and discuss printed DM materials related to Diabetes Management (review with CM at next call)  -adhere to DM dietary recommendations  Follow Up Plan: Telephone follow  up appointment with care management team member scheduled for: 06/03/20    Plan:Telephone follow up appointment with care management team member scheduled for:  06/03/20  Barb Merino, RN, BSN, CCM Care Management Coordinator [facility]  Direct Phone: [phone]

## 2020-04-28 DIAGNOSIS — Z992 Dependence on renal dialysis: Secondary | ICD-10-CM | POA: Diagnosis not present

## 2020-04-28 DIAGNOSIS — N2581 Secondary hyperparathyroidism of renal origin: Secondary | ICD-10-CM | POA: Diagnosis not present

## 2020-04-28 DIAGNOSIS — D631 Anemia in chronic kidney disease: Secondary | ICD-10-CM | POA: Diagnosis not present

## 2020-04-28 DIAGNOSIS — N186 End stage renal disease: Secondary | ICD-10-CM | POA: Diagnosis not present

## 2020-04-30 DIAGNOSIS — N2581 Secondary hyperparathyroidism of renal origin: Secondary | ICD-10-CM | POA: Diagnosis not present

## 2020-04-30 DIAGNOSIS — D631 Anemia in chronic kidney disease: Secondary | ICD-10-CM | POA: Diagnosis not present

## 2020-04-30 DIAGNOSIS — Z992 Dependence on renal dialysis: Secondary | ICD-10-CM | POA: Diagnosis not present

## 2020-04-30 DIAGNOSIS — N186 End stage renal disease: Secondary | ICD-10-CM | POA: Diagnosis not present

## 2020-05-02 DIAGNOSIS — Z992 Dependence on renal dialysis: Secondary | ICD-10-CM | POA: Diagnosis not present

## 2020-05-02 DIAGNOSIS — I129 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 kidney disease, or unspecified chronic kidney disease: Secondary | ICD-10-CM | POA: Diagnosis not present

## 2020-05-02 DIAGNOSIS — N186 End stage renal disease: Secondary | ICD-10-CM | POA: Diagnosis not present

## 2020-05-03 DIAGNOSIS — N186 End stage renal disease: Secondary | ICD-10-CM | POA: Diagnosis not present

## 2020-05-03 DIAGNOSIS — D631 Anemia in chronic kidney disease: Secondary | ICD-10-CM | POA: Diagnosis not present

## 2020-05-03 DIAGNOSIS — Z992 Dependence on renal dialysis: Secondary | ICD-10-CM | POA: Diagnosis not present

## 2020-05-03 DIAGNOSIS — N2581 Secondary hyperparathyroidism of renal origin: Secondary | ICD-10-CM | POA: Diagnosis not present

## 2020-05-05 DIAGNOSIS — N186 End stage renal disease: Secondary | ICD-10-CM | POA: Diagnosis not present

## 2020-05-05 DIAGNOSIS — D631 Anemia in chronic kidney disease: Secondary | ICD-10-CM | POA: Diagnosis not present

## 2020-05-05 DIAGNOSIS — Z992 Dependence on renal dialysis: Secondary | ICD-10-CM | POA: Diagnosis not present

## 2020-05-05 DIAGNOSIS — N2581 Secondary hyperparathyroidism of renal origin: Secondary | ICD-10-CM | POA: Diagnosis not present

## 2020-05-07 DIAGNOSIS — N186 End stage renal disease: Secondary | ICD-10-CM | POA: Diagnosis not present

## 2020-05-07 DIAGNOSIS — N2581 Secondary hyperparathyroidism of renal origin: Secondary | ICD-10-CM | POA: Diagnosis not present

## 2020-05-07 DIAGNOSIS — Z992 Dependence on renal dialysis: Secondary | ICD-10-CM | POA: Diagnosis not present

## 2020-05-07 DIAGNOSIS — D631 Anemia in chronic kidney disease: Secondary | ICD-10-CM | POA: Diagnosis not present

## 2020-05-10 DIAGNOSIS — N2581 Secondary hyperparathyroidism of renal origin: Secondary | ICD-10-CM | POA: Diagnosis not present

## 2020-05-10 DIAGNOSIS — N186 End stage renal disease: Secondary | ICD-10-CM | POA: Diagnosis not present

## 2020-05-10 DIAGNOSIS — Z992 Dependence on renal dialysis: Secondary | ICD-10-CM | POA: Diagnosis not present

## 2020-05-12 DIAGNOSIS — Z992 Dependence on renal dialysis: Secondary | ICD-10-CM | POA: Diagnosis not present

## 2020-05-12 DIAGNOSIS — N2581 Secondary hyperparathyroidism of renal origin: Secondary | ICD-10-CM | POA: Diagnosis not present

## 2020-05-12 DIAGNOSIS — N186 End stage renal disease: Secondary | ICD-10-CM | POA: Diagnosis not present

## 2020-05-13 ENCOUNTER — Other Ambulatory Visit: Payer: Self-pay | Admitting: Internal Medicine

## 2020-05-14 DIAGNOSIS — Z992 Dependence on renal dialysis: Secondary | ICD-10-CM | POA: Diagnosis not present

## 2020-05-14 DIAGNOSIS — N2581 Secondary hyperparathyroidism of renal origin: Secondary | ICD-10-CM | POA: Diagnosis not present

## 2020-05-14 DIAGNOSIS — N186 End stage renal disease: Secondary | ICD-10-CM | POA: Diagnosis not present

## 2020-05-17 DIAGNOSIS — N186 End stage renal disease: Secondary | ICD-10-CM | POA: Diagnosis not present

## 2020-05-17 DIAGNOSIS — Z992 Dependence on renal dialysis: Secondary | ICD-10-CM | POA: Diagnosis not present

## 2020-05-17 DIAGNOSIS — N2581 Secondary hyperparathyroidism of renal origin: Secondary | ICD-10-CM | POA: Diagnosis not present

## 2020-05-19 DIAGNOSIS — Z992 Dependence on renal dialysis: Secondary | ICD-10-CM | POA: Diagnosis not present

## 2020-05-19 DIAGNOSIS — N2581 Secondary hyperparathyroidism of renal origin: Secondary | ICD-10-CM | POA: Diagnosis not present

## 2020-05-19 DIAGNOSIS — N186 End stage renal disease: Secondary | ICD-10-CM | POA: Diagnosis not present

## 2020-05-21 DIAGNOSIS — N186 End stage renal disease: Secondary | ICD-10-CM | POA: Diagnosis not present

## 2020-05-21 DIAGNOSIS — Z992 Dependence on renal dialysis: Secondary | ICD-10-CM | POA: Diagnosis not present

## 2020-05-21 DIAGNOSIS — N2581 Secondary hyperparathyroidism of renal origin: Secondary | ICD-10-CM | POA: Diagnosis not present

## 2020-05-24 DIAGNOSIS — Z992 Dependence on renal dialysis: Secondary | ICD-10-CM | POA: Diagnosis not present

## 2020-05-24 DIAGNOSIS — N186 End stage renal disease: Secondary | ICD-10-CM | POA: Diagnosis not present

## 2020-05-24 DIAGNOSIS — N2581 Secondary hyperparathyroidism of renal origin: Secondary | ICD-10-CM | POA: Diagnosis not present

## 2020-05-25 ENCOUNTER — Ambulatory Visit: Payer: Medicare Other | Admitting: Podiatry

## 2020-05-25 ENCOUNTER — Encounter: Payer: Self-pay | Admitting: Podiatry

## 2020-05-25 ENCOUNTER — Other Ambulatory Visit: Payer: Self-pay

## 2020-05-25 DIAGNOSIS — E0822 Diabetes mellitus due to underlying condition with diabetic chronic kidney disease: Secondary | ICD-10-CM

## 2020-05-25 DIAGNOSIS — M79674 Pain in right toe(s): Secondary | ICD-10-CM | POA: Diagnosis not present

## 2020-05-25 DIAGNOSIS — M2041 Other hammer toe(s) (acquired), right foot: Secondary | ICD-10-CM

## 2020-05-25 DIAGNOSIS — B351 Tinea unguium: Secondary | ICD-10-CM | POA: Diagnosis not present

## 2020-05-25 DIAGNOSIS — M2042 Other hammer toe(s) (acquired), left foot: Secondary | ICD-10-CM

## 2020-05-25 DIAGNOSIS — Z992 Dependence on renal dialysis: Secondary | ICD-10-CM

## 2020-05-25 DIAGNOSIS — M79675 Pain in left toe(s): Secondary | ICD-10-CM | POA: Diagnosis not present

## 2020-05-25 DIAGNOSIS — N186 End stage renal disease: Secondary | ICD-10-CM

## 2020-05-25 DIAGNOSIS — E1142 Type 2 diabetes mellitus with diabetic polyneuropathy: Secondary | ICD-10-CM

## 2020-05-25 DIAGNOSIS — M19072 Primary osteoarthritis, left ankle and foot: Secondary | ICD-10-CM

## 2020-05-26 DIAGNOSIS — N186 End stage renal disease: Secondary | ICD-10-CM | POA: Diagnosis not present

## 2020-05-26 DIAGNOSIS — Z992 Dependence on renal dialysis: Secondary | ICD-10-CM | POA: Diagnosis not present

## 2020-05-26 DIAGNOSIS — N2581 Secondary hyperparathyroidism of renal origin: Secondary | ICD-10-CM | POA: Diagnosis not present

## 2020-05-28 DIAGNOSIS — N2581 Secondary hyperparathyroidism of renal origin: Secondary | ICD-10-CM | POA: Diagnosis not present

## 2020-05-28 DIAGNOSIS — Z992 Dependence on renal dialysis: Secondary | ICD-10-CM | POA: Diagnosis not present

## 2020-05-28 DIAGNOSIS — N186 End stage renal disease: Secondary | ICD-10-CM | POA: Diagnosis not present

## 2020-05-29 NOTE — Progress Notes (Signed)
Cardiology Office Note:   Date:  06/01/2020  NAME:  Elijah White    MRN: [id number] DOB:  11-Jun-1940   PCP:  [name], MD  Cardiologist:  Evalina Field, MD  Electrophysiologist:  None   Referring MD: [name], MD   Chief Complaint  Patient presents with  . Congestive Heart Failure   History of Present Illness:   Elijah White is a 80 y.o. male with a hx of severe aortic stenosis status post TAVR, ESRD, diabetes, HFpEF, nonobstructive CAD who presents for follow-up.  Seen by me in the hospital in September for syncope.  Found to have severe aortic stenosis.  He is status post TAVR.  Most recent echo shows normal gradients across the prosthetic heart valve.  Reports he is doing well.  He presents with his niece.  He is blind.  He is not that active.  He uses a walker.  He lives at home.  His niece provides assistance at home.  He denies any chest pain or shortness of breath.  He can get a little tired with some activity.  He is tolerating dialysis well.  Blood pressures have been low at dialysis per her report.  BP 103/53.  Likely can back off of his antihypertensive agents.  He is on Plavix 75 mg daily.  The plan is to complete 6 months of Plavix and then to pursue aspirin indefinitely.  We will plan to switch him on 07/18/2020.  Overall he is doing well after his TAVR procedure.  He denies any significant symptoms in the office today.  Cardiovascular examination unremarkable.  He did have a 3 mm lung nodule on his CT TAVR work-up.  He will need a follow-up Noncon CT scan in September of this year.  Problem List 1. ESRD on HD 2. HTN 3. DM2 -A1c 7.1 4. HFpEF 5. 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  Hyperlipidemia -Total cholesterol 213, HDL 63, LDL 103, triglycerides 91 8. Blindness  9. 3 mm Left upper lobe pulmonary nodule  -needs CT non-con 11/2020 (former smoker)  Past Medical History: Past Medical  History:  Diagnosis Date  . Anemia    low iron  . Arthritis   . Cancer [facility]) ?2006   prostate  . Colon polyps ~ 1993 and 2003   Dr [name], [facility].  08/2001 colonoscopy: tubular adenoma at cecum.    . 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 Elevated cholesterol with high triglycerides   . ESRD (end stage renal disease) ([place])    [facility]  . GERD (gastroesophageal reflux disease)   . Hypertension   . Legally blind in left eye, as defined in [place]    has pinpoint vision in right eye  . RBBB     Past Surgical History: Past Surgical History:  Procedure Laterality Date  . AV FISTULA PLACEMENT Left 03/20/2018   Procedure: ARTERIOVENOUS (AV) FISTULA CREATION ARM;  Surgeon: [name], MD;  Location: [place];  Service: Vascular;  Laterality: Left;  . BASCILIC VEIN TRANSPOSITION Left 05/21/2018   Procedure: LEFT BASILIC VEIN FISTULA SECOND STAGE;  Surgeon: [name], MD;  Location: [place];  Service: Vascular;  Laterality: Left;  . COLONOSCOPY    . COLONOSCOPY WITH PROPOFOL N/A 11/30/2019   Procedure: COLONOSCOPY WITH PROPOFOL;  Surgeon: Ronnette Juniper, MD;  Location: [place];  Service:  Gastroenterology;  Laterality: N/A;  . GLAUCOMA SURGERY  2019   multiple surgeries  . HEMOSTASIS CLIP PLACEMENT  11/30/2019   Procedure: HEMOSTASIS CLIP PLACEMENT;  Surgeon: Ronnette Juniper, MD;  Location: [place];  Service: Gastroenterology;;  . KNEE ARTHROSCOPY    . MULTIPLE EXTRACTIONS WITH ALVEOLOPLASTY N/A 12/30/2019   Procedure: MULTIPLE EXTRACTION WITH ALVEOLOPLASTY;  Surgeon: Charlaine Dalton, DMD;  Location: [place];  Service: Dentistry;  Laterality: N/A;  . POLYPECTOMY  11/30/2019   Procedure: POLYPECTOMY;  Surgeon: Ronnette Juniper, MD;  Location: [place];  Service: Gastroenterology;;  . PROSTATE SURGERY    . RIGHT/LEFT HEART CATH AND CORONARY ANGIOGRAPHY N/A 12/03/2019   Procedure: RIGHT/LEFT HEART CATH AND CORONARY  ANGIOGRAPHY;  Surgeon: Burnell Blanks, MD;  Location: [place] CV LAB;  Service: Cardiovascular;  Laterality: N/A;  . SUBMUCOSAL TATTOO INJECTION  11/30/2019   Procedure: SUBMUCOSAL TATTOO INJECTION;  Surgeon: Ronnette Juniper, MD;  Location: [facility] ENDOSCOPY;  Service: Gastroenterology;;  . TEE WITHOUT CARDIOVERSION N/A 01/19/2020   Procedure: TRANSESOPHAGEAL ECHOCARDIOGRAM (TEE);  Surgeon: Burnell Blanks, MD;  Location: [place] CV LAB;  Service: Open Heart Surgery;  Laterality: N/A;  . TRANSCATHETER AORTIC VALVE REPLACEMENT, TRANSFEMORAL Left 01/19/2020   Procedure: TRANSCATHETER AORTIC VALVE REPLACEMENT, LEFT TRANSFEMORAL;  Surgeon: Burnell Blanks, MD;  Location: [place] CV LAB;  Service: Open Heart Surgery;  Laterality: Left;    Current Medications: Current Meds  Medication Sig  . acetaminophen (TYLENOL) 500 MG tablet Take 2 tablets (1,000 mg total) by mouth every 6 (six) hours as needed for mild pain, moderate pain or headache.  [name] apraclonidine (IOPIDINE) 0.5 % ophthalmic solution Place 1 drop into the right eye 3 (three) times daily.   [name] atorvastatin (LIPITOR) 10 MG tablet TAKE 1 TABLET BY MOUTH EVERY DAY  . B Complex-C-Folic Acid (DIALYVITE 973) 0.8 MG TABS Take 0.8 mg by mouth daily.   . blood glucose meter kit and supplies KIT Dispense based on patient and insurance preference. Use up to four times daily as directed. (FOR ICD-9 250.00, 250.01). For QAC - HS accuchecks.  . carvedilol (COREG) 3.125 MG tablet TAKE 1 TABLET(3.125 MG) BY MOUTH TWICE DAILY WITH A MEAL  . dorzolamide-timolol (COSOPT) 22.3-6.8 MG/ML ophthalmic solution Place 1 drop into the right eye 2 (two) times daily.  [name] gabapentin (NEURONTIN) 100 MG capsule Take 1 capsule (100 mg total) by mouth at bedtime.  . hydroxypropyl methylcellulose / hypromellose (ISOPTO TEARS / GONIOVISC) 2.5 % ophthalmic solution Place 1 drop into the left eye as needed for dry eyes.  . Lancets (ONETOUCH DELICA PLUS  [id number]) MISC   . latanoprost (XALATAN) 0.005 % ophthalmic solution Place 1 drop into the right eye at bedtime.  [name] test strip USE AS DIRECTED TO TEST FOUR TIMES A DAY  . pantoprazole (PROTONIX) 40 MG tablet Take 1 tablet (40 mg total) by mouth daily at 12 noon.  . pilocarpine (PILOCAR) 4 % ophthalmic solution Place 1 drop into the right eye 4 (four) times daily.   . [DISCONTINUED] clopidogrel (PLAVIX) 75 MG tablet Take 1 tablet (75 mg total) by mouth daily with breakfast.  . [DISCONTINUED] hydrALAZINE (APRESOLINE) 25 MG tablet TAKE 1 TABLET(25 MG) BY MOUTH TWICE DAILY  . [DISCONTINUED] isosorbide mononitrate (IMDUR) 30 MG 24 hr tablet TAKE 1 TABLET(30 MG) BY MOUTH DAILY     Allergies:    Patient has no known allergies.   Social History: Social History   Socioeconomic History  . Marital status:  Widowed    Spouse name: Not on file  . Number of children: Not on file  . Years of education: Not on file  . Highest education level: Not on file  Occupational History  . Occupation: retired  Tobacco Use  . Smoking status: Former Smoker    Packs/day: 0.50    Quit date: 03/23/2018    Years since quitting: 2.1  . Smokeless tobacco: Never Used  Vaping Use  . Vaping Use: Never used  Substance and Sexual Activity  . Alcohol use: No  . Drug use: No  . Sexual activity: Not Currently  Other Topics Concern  . Not on file  Social History Narrative  . Not on file   Social Determinants of Health   Financial Resource Strain: Not on file  Food Insecurity: No Food Insecurity  . Worried About Charity fundraiser in the Last Year: Never true  . Ran Out of Food in the Last Year: Never true  Transportation Needs: Not on file  Physical Activity: Not on file  Stress: Not on file  Social Connections: Not on file     Family History: The patient's family history includes CAD in his mother.  ROS:   All other ROS reviewed and negative. Pertinent positives noted in the HPI.      EKGs/Labs/Other Studies Reviewed:   The following studies were personally reviewed by me today:  TTE 03/02/2020 1. Left ventricular ejection fraction, by estimation, is 60 to 65%. The  left ventricle has normal function. The left ventricle has no regional  wall motion abnormalities. Left ventricular diastolic parameters are  indeterminate.  2. Right ventricular systolic function is normal. The right ventricular  size is normal.  3. The mitral valve is degenerative. Trivial mitral valve regurgitation.  Severe mitral annular calcification.  4. No Paravalular leak noted; Acceleration time < 100 ms; DVI 0.76. The  aortic valve has been repaired/replaced. Aortic valve regurgitation is not  visualized. There is a 26 mm Sapien prosthetic (TAVR) valve present in the  aortic position. Procedure  Date: 01/19/2020.  5. The inferior vena cava is normal in size with greater than 50%  respiratory variability, suggesting right atrial pressure of 3 mmHg.   LHC 12/03/2019   Prox RCA to Mid RCA lesion is 20% stenosed.  Dist RCA lesion is 20% stenosed.  RPAV lesion is 40% stenosed.  Mid Cx lesion is 20% stenosed.  Prox LAD lesion is 30% stenosed.   1. Mild non-obstructive CAD 2. Severe aortic stenosis by echo (cath mean gradient 26.8 mmHg0   Recent Labs: 11/26/2019: TSH 0.820 01/14/2020: ALT 10; B Natriuretic Peptide 335.4 01/20/2020: BUN 34; Creatinine, Ser 8.19; Hemoglobin 10.7; Magnesium 2.0; Platelets 156; Potassium 4.9; Sodium 140   Recent Lipid Panel    Component Value Date/Time   LDLCALC 103 09/11/2019 0000    Physical Exam:   VS:  BP (!) 103/53   Pulse 70   Ht $R'5\' 5"'Gt$  (1.651 m)   Wt 162 lb 3.2 oz (73.6 kg)   SpO2 99%   BMI 26.99 kg/m    Wt Readings from Last 3 Encounters:  06/01/20 162 lb 3.2 oz (73.6 kg)  03/02/20 164 lb (74.4 kg)  02/03/20 168 lb (76.2 kg)    General: Well nourished, well developed, in no acute distress Head: Atraumatic, normal size  Eyes:  PEERLA, EOMI  Neck: Supple, no JVD Endocrine: No thryomegaly Cardiac: Normal S1, S2; RRR; 1 out of 6 systolic ejection murmur Lungs: Clear to auscultation  bilaterally, no wheezing, rhonchi or rales  Abd: Soft, nontender, no hepatomegaly  Ext: No edema, pulses 2+ Musculoskeletal: No deformities, BUE and BLE strength normal and equal Skin: Warm and dry, no rashes   Neuro: Alert and oriented to person, place, time, and situation, CNII-XII grossly intact, no focal deficits  Psych: Normal mood and affect   ASSESSMENT:   Elijah White is a 80 y.o. male who presents for the following: 1. S/P TAVR (transcatheter aortic valve replacement)   2. Coronary artery disease involving native coronary artery of native heart without angina pectoris   3. Primary hypertension   4. Mixed hyperlipidemia     PLAN:   1. S/P TAVR (transcatheter aortic valve replacement) -26 mm S3 01/19/2020.  Complete 6 months of Plavix therapy.  He will transition to aspirin 81 mg daily on 07/19/2019. -No significant murmurs on examination.  Most recent echocardiogram shows valve is functioning normally. -Counseled on SBE prophylaxis.  2. Coronary artery disease involving native coronary artery of native heart without angina pectoris -Nonobstructive CAD on cath.  Continue aspirin and statin therapy.  3. Primary hypertension -BP is a bit soft.  Stop hydralazine.  Stop Imdur.  Continue Coreg 3.125 mg twice daily.  4. Mixed hyperlipidemia -Continue Lipitor 40 mg daily  Disposition: Return in about 6 months (around 12/02/2020).  Medication Adjustments/Labs and Tests Ordered: Current medicines are reviewed at length with the patient today.  Concerns regarding medicines are outlined above.  No orders of the defined types were placed in this encounter.  Meds ordered this encounter  Medications  . clopidogrel (PLAVIX) 75 MG tablet    Sig: Take 1 tablet (75 mg total) by mouth daily with breakfast.    Dispense:  60 tablet     Refill:  0    Patient Instructions  Medication Instructions:  Stop Hydralazine  Stop Imdur  You will stop Plavix on 07/18/2020- next day will start Aspirin 81 mg.   *If you need a refill on your cardiac medications before your next appointment, please call your pharmacy*   Follow-Up: At [facility], you and your health needs are our priority.  As part of our continuing mission to provide you with exceptional heart care, we have created designated Provider Care Teams.  These Care Teams include your primary Cardiologist (physician) and Advanced Practice Providers (APPs -  Physician Assistants and Nurse Practitioners) who all work together to provide you with the care you need, when you need it.  We recommend signing up for the patient portal called "MyChart".  Sign up information is provided on this After Visit Summary.  MyChart is used to connect with patients for Virtual Visits (Telemedicine).  Patients are able to view lab/test results, encounter notes, upcoming appointments, etc.  Non-urgent messages can be sent to your provider as well.   To learn more about what you can do with MyChart, go to [email or web address].    Your next appointment:   6 month(s)  The format for your next appointment:   In Person  Provider:   Eleonore Chiquito, MD        Time Spent with Patient: I have spent a total of 25 minutes with patient reviewing hospital notes, telemetry, EKGs, labs and examining the patient as well as establishing an assessment and plan that was discussed with the patient.  > 50% of time was spent in direct patient care.  Signed, Addison Naegeli. Audie Box, MD, [place]  [address], [address]  [place], [place] [address] [phone]  06/01/2020 1:29 PM

## 2020-05-29 NOTE — Progress Notes (Signed)
  Subjective:  Patient ID: Elijah White, male    DOB: 1940-04-30,  MRN: [id number]  80 y.o. 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 Pain interferes with ambulation. Aggravating factors include wearing enclosed shoe gear. Pain is relieved with periodic professional debridement.   His daughter is present during today's visit.  Elijah White started hemodialysis in February and is on a TTS schedule.   He continues to take gabapentin for neuropathy.  Review of Systems: Negative except as noted in the HPI.   No Known Allergies   Objective:  There were no vitals filed for this visit. Constitutional Patient is a pleasant 80 y.o. African American male WD, WN in NAD.[name] AAO x 3.  Vascular Capillary fill White to digits <3 seconds b/l lower extremities. Faintly palpable DP pulse(s) b/l lower extremities. Faintly palpable PT pulse(s) b/l lower extremities. Pedal hair sparse. Lower extremity skin temperature gradient within normal limits. No ischemia or gangrene noted b/l lower extremities. No cyanosis or clubbing noted.  Neurologic Normal speech.Elijah White. Pt has subjective symptoms of neuropathy. Protective sensation intact 5/5 intact bilaterally with 10g monofilament b/l. Vibratory sensation intact b/l.  Dermatologic Pedal skin with normal turgor, texture and tone bilaterally. No open wounds bilaterally. No interdigital macerations bilaterally. Toenails 1-5 b/l elongated, discolored, dystrophic, thickened, crumbly with subungual debris and tenderness to dorsal palpation. No hyperkeratotic nor porokeratotic lesions present on today's visit.  Orthopedic: Normal muscle strength 5/5 to all lower extremity muscle groups bilaterally. Palpable exostosis noted dorsomedial midfoot joint of left foot. Hammertoes noted to the L 5th toe and R 5th toe. Utilizes walker for ambulation assistance.    Hemoglobin A1C Latest Ref Rng & Units 01/14/2020 11/26/2019 09/11/2019  HGBA1C 4.8 - 5.6 % 7.1(H) 7.5(H) 6.7  Some recent data might be hidden   Assessment:   1. Pain due to onychomycosis of toenails of both feet   2. Acquired hammertoes of both feet   3. Primary osteoarthritis of left foot   4. Diabetic peripheral neuropathy associated with type 2 diabetes mellitus ([place])   5. Diabetes mellitus due to underlying condition with chronic kidney disease on chronic dialysis, without long-term current use of insulin ([place])    Plan:  Patient was evaluated and treated and all questions answered.  Onychomycosis with pain -Nails palliatively debridement as below. -Educated on self-care  Procedure: Nail Debridement Rationale: Pain Type of Debridement: manual, sharp debridement. Instrumentation: Nail nipper, rotary burr. Number of Nails: 10  -Examined patient. -Continue diabetic foot care principles. -Toenails 1-5 b/l were debrided in length and girth with sterile nail nippers and dremel without iatrogenic bleeding.  -Patient to report any pedal injuries to medical professional immediately. -Contnue Aspercreme to feet prn. -Patient to continue soft, supportive shoe gear daily. -Patient/POA to call should there be question/concern in the interim.  Return in about 3 months (around 08/25/2020) for diabetic nail trim.  [name], DPM

## 2020-05-31 DIAGNOSIS — Z992 Dependence on renal dialysis: Secondary | ICD-10-CM | POA: Diagnosis not present

## 2020-05-31 DIAGNOSIS — N186 End stage renal disease: Secondary | ICD-10-CM | POA: Diagnosis not present

## 2020-05-31 DIAGNOSIS — N2581 Secondary hyperparathyroidism of renal origin: Secondary | ICD-10-CM | POA: Diagnosis not present

## 2020-06-01 ENCOUNTER — Other Ambulatory Visit: Payer: Self-pay

## 2020-06-01 ENCOUNTER — Ambulatory Visit: Payer: Medicare Other | Admitting: Cardiovascular Disease

## 2020-06-01 ENCOUNTER — Encounter: Payer: Self-pay | Admitting: Cardiovascular Disease

## 2020-06-01 VITALS — BP 103/53 | HR 70 | Ht 65.0 in | Wt 162.2 lb

## 2020-06-01 DIAGNOSIS — I10 Essential (primary) hypertension: Secondary | ICD-10-CM | POA: Diagnosis not present

## 2020-06-01 DIAGNOSIS — I2510 Atherosclerotic heart disease of native coronary artery without angina pectoris: Secondary | ICD-10-CM | POA: Diagnosis not present

## 2020-06-01 DIAGNOSIS — E782 Mixed hyperlipidemia: Secondary | ICD-10-CM | POA: Diagnosis not present

## 2020-06-01 DIAGNOSIS — Z952 Presence of prosthetic heart valve: Secondary | ICD-10-CM | POA: Diagnosis not present

## 2020-06-01 MED ORDER — CLOPIDOGREL BISULFATE 75 MG PO TABS
75.0000 mg | ORAL_TABLET | Freq: Every day | ORAL | 0 refills | Status: DC
Start: 2020-06-01 — End: 2020-07-25

## 2020-06-01 NOTE — Patient Instructions (Signed)
Medication Instructions:  Stop Hydralazine  Stop Imdur  You will stop Plavix on 07/18/2020- next day will start Aspirin 81 mg.   *If you need a refill on your cardiac medications before your next appointment, please call your pharmacy*   Follow-Up: At [facility], you and your health needs are our priority.  As part of our continuing mission to provide you with exceptional heart care, we have created designated Provider Care Teams.  These Care Teams include your primary Cardiologist (physician) and Advanced Practice Providers (APPs -  Physician Assistants and Nurse Practitioners) who all work together to provide you with the care you need, when you need it.  We recommend signing up for the patient portal called "MyChart".  Sign up information is provided on this After Visit Summary.  MyChart is used to connect with patients for Virtual Visits (Telemedicine).  Patients are able to view lab/test results, encounter notes, upcoming appointments, etc.  Non-urgent messages can be sent to your provider as well.   To learn more about what you can do with MyChart, go to [email or web address].    Your next appointment:   6 month(s)  The format for your next appointment:   In Person  Provider:   Eleonore Chiquito, MD

## 2020-06-02 DIAGNOSIS — I129 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 kidney disease, or unspecified chronic kidney disease: Secondary | ICD-10-CM | POA: Diagnosis not present

## 2020-06-02 DIAGNOSIS — Z992 Dependence on renal dialysis: Secondary | ICD-10-CM | POA: Diagnosis not present

## 2020-06-02 DIAGNOSIS — N2581 Secondary hyperparathyroidism of renal origin: Secondary | ICD-10-CM | POA: Diagnosis not present

## 2020-06-02 DIAGNOSIS — N186 End stage renal disease: Secondary | ICD-10-CM | POA: Diagnosis not present

## 2020-06-03 ENCOUNTER — Telehealth: Payer: Medicare Other

## 2020-06-04 DIAGNOSIS — N186 End stage renal disease: Secondary | ICD-10-CM | POA: Diagnosis not present

## 2020-06-04 DIAGNOSIS — Z992 Dependence on renal dialysis: Secondary | ICD-10-CM | POA: Diagnosis not present

## 2020-06-04 DIAGNOSIS — N2581 Secondary hyperparathyroidism of renal origin: Secondary | ICD-10-CM | POA: Diagnosis not present

## 2020-06-07 DIAGNOSIS — E118 Type 2 diabetes mellitus with unspecified complications: Secondary | ICD-10-CM | POA: Diagnosis not present

## 2020-06-07 DIAGNOSIS — N186 End stage renal disease: Secondary | ICD-10-CM | POA: Diagnosis not present

## 2020-06-07 DIAGNOSIS — N2581 Secondary hyperparathyroidism of renal origin: Secondary | ICD-10-CM | POA: Diagnosis not present

## 2020-06-07 DIAGNOSIS — Z992 Dependence on renal dialysis: Secondary | ICD-10-CM | POA: Diagnosis not present

## 2020-06-07 DIAGNOSIS — D631 Anemia in chronic kidney disease: Secondary | ICD-10-CM | POA: Diagnosis not present

## 2020-06-08 ENCOUNTER — Other Ambulatory Visit: Payer: Self-pay | Admitting: Internal Medicine

## 2020-06-09 DIAGNOSIS — N186 End stage renal disease: Secondary | ICD-10-CM | POA: Diagnosis not present

## 2020-06-09 DIAGNOSIS — E118 Type 2 diabetes mellitus with unspecified complications: Secondary | ICD-10-CM | POA: Diagnosis not present

## 2020-06-09 DIAGNOSIS — Z992 Dependence on renal dialysis: Secondary | ICD-10-CM | POA: Diagnosis not present

## 2020-06-09 DIAGNOSIS — D631 Anemia in chronic kidney disease: Secondary | ICD-10-CM | POA: Diagnosis not present

## 2020-06-09 DIAGNOSIS — N2581 Secondary hyperparathyroidism of renal origin: Secondary | ICD-10-CM | POA: Diagnosis not present

## 2020-06-11 DIAGNOSIS — E118 Type 2 diabetes mellitus with unspecified complications: Secondary | ICD-10-CM | POA: Diagnosis not present

## 2020-06-11 DIAGNOSIS — D631 Anemia in chronic kidney disease: Secondary | ICD-10-CM | POA: Diagnosis not present

## 2020-06-11 DIAGNOSIS — N186 End stage renal disease: Secondary | ICD-10-CM | POA: Diagnosis not present

## 2020-06-11 DIAGNOSIS — Z992 Dependence on renal dialysis: Secondary | ICD-10-CM | POA: Diagnosis not present

## 2020-06-11 DIAGNOSIS — N2581 Secondary hyperparathyroidism of renal origin: Secondary | ICD-10-CM | POA: Diagnosis not present

## 2020-06-14 DIAGNOSIS — Z992 Dependence on renal dialysis: Secondary | ICD-10-CM | POA: Diagnosis not present

## 2020-06-14 DIAGNOSIS — N2581 Secondary hyperparathyroidism of renal origin: Secondary | ICD-10-CM | POA: Diagnosis not present

## 2020-06-14 DIAGNOSIS — N186 End stage renal disease: Secondary | ICD-10-CM | POA: Diagnosis not present

## 2020-06-16 DIAGNOSIS — N186 End stage renal disease: Secondary | ICD-10-CM | POA: Diagnosis not present

## 2020-06-16 DIAGNOSIS — N2581 Secondary hyperparathyroidism of renal origin: Secondary | ICD-10-CM | POA: Diagnosis not present

## 2020-06-16 DIAGNOSIS — Z992 Dependence on renal dialysis: Secondary | ICD-10-CM | POA: Diagnosis not present

## 2020-06-18 DIAGNOSIS — N186 End stage renal disease: Secondary | ICD-10-CM | POA: Diagnosis not present

## 2020-06-18 DIAGNOSIS — N2581 Secondary hyperparathyroidism of renal origin: Secondary | ICD-10-CM | POA: Diagnosis not present

## 2020-06-18 DIAGNOSIS — Z992 Dependence on renal dialysis: Secondary | ICD-10-CM | POA: Diagnosis not present

## 2020-06-20 DIAGNOSIS — H401133 Primary open-angle glaucoma, bilateral, severe stage: Secondary | ICD-10-CM | POA: Diagnosis not present

## 2020-06-21 DIAGNOSIS — N186 End stage renal disease: Secondary | ICD-10-CM | POA: Diagnosis not present

## 2020-06-21 DIAGNOSIS — Z992 Dependence on renal dialysis: Secondary | ICD-10-CM | POA: Diagnosis not present

## 2020-06-21 DIAGNOSIS — N2581 Secondary hyperparathyroidism of renal origin: Secondary | ICD-10-CM | POA: Diagnosis not present

## 2020-06-23 DIAGNOSIS — N186 End stage renal disease: Secondary | ICD-10-CM | POA: Diagnosis not present

## 2020-06-23 DIAGNOSIS — N2581 Secondary hyperparathyroidism of renal origin: Secondary | ICD-10-CM | POA: Diagnosis not present

## 2020-06-23 DIAGNOSIS — Z992 Dependence on renal dialysis: Secondary | ICD-10-CM | POA: Diagnosis not present

## 2020-06-24 ENCOUNTER — Encounter (HOSPITAL_COMMUNITY): Payer: Self-pay

## 2020-06-24 ENCOUNTER — Emergency Department (HOSPITAL_COMMUNITY): Payer: Medicare Other

## 2020-06-24 ENCOUNTER — Emergency Department (HOSPITAL_COMMUNITY)
Admission: EM | Admit: 2020-06-24 | Discharge: 2020-06-25 | Disposition: A | Discharge status: Home / Self Care | Payer: Medicare Other | Attending: Emergency Medicine | Admitting: Emergency Medicine

## 2020-06-24 DIAGNOSIS — I743 Embolism and thrombosis of arteries of the lower extremities: Secondary | ICD-10-CM | POA: Diagnosis not present

## 2020-06-24 DIAGNOSIS — Z87891 Personal history of nicotine dependence: Secondary | ICD-10-CM | POA: Diagnosis not present

## 2020-06-24 DIAGNOSIS — E1140 Type 2 diabetes mellitus with diabetic neuropathy, unspecified: Secondary | ICD-10-CM | POA: Diagnosis not present

## 2020-06-24 DIAGNOSIS — I708 Atherosclerosis of other arteries: Secondary | ICD-10-CM | POA: Diagnosis not present

## 2020-06-24 DIAGNOSIS — R0602 Shortness of breath: Secondary | ICD-10-CM | POA: Insufficient documentation

## 2020-06-24 DIAGNOSIS — E1122 Type 2 diabetes mellitus with diabetic chronic kidney disease: Secondary | ICD-10-CM | POA: Insufficient documentation

## 2020-06-24 DIAGNOSIS — R531 Weakness: Secondary | ICD-10-CM | POA: Diagnosis not present

## 2020-06-24 DIAGNOSIS — M79604 Pain in right leg: Secondary | ICD-10-CM | POA: Insufficient documentation

## 2020-06-24 DIAGNOSIS — Z992 Dependence on renal dialysis: Secondary | ICD-10-CM | POA: Diagnosis not present

## 2020-06-24 DIAGNOSIS — I120 Hypertensive chronic kidney disease with stage 5 chronic kidney disease or end stage renal disease: Secondary | ICD-10-CM | POA: Diagnosis not present

## 2020-06-24 DIAGNOSIS — Z8546 Personal history of malignant neoplasm of prostate: Secondary | ICD-10-CM | POA: Diagnosis not present

## 2020-06-24 DIAGNOSIS — Z7902 Long term (current) use of antithrombotics/antiplatelets: Secondary | ICD-10-CM | POA: Diagnosis not present

## 2020-06-24 DIAGNOSIS — Z20822 Contact with and (suspected) exposure to covid-19: Secondary | ICD-10-CM | POA: Diagnosis not present

## 2020-06-24 DIAGNOSIS — G4489 Other headache syndrome: Secondary | ICD-10-CM | POA: Diagnosis not present

## 2020-06-24 DIAGNOSIS — N186 End stage renal disease: Secondary | ICD-10-CM | POA: Diagnosis not present

## 2020-06-24 DIAGNOSIS — I701 Atherosclerosis of renal artery: Secondary | ICD-10-CM | POA: Diagnosis not present

## 2020-06-24 DIAGNOSIS — I739 Peripheral vascular disease, unspecified: Secondary | ICD-10-CM

## 2020-06-24 DIAGNOSIS — Z79899 Other long term (current) drug therapy: Secondary | ICD-10-CM | POA: Insufficient documentation

## 2020-06-24 DIAGNOSIS — M79605 Pain in left leg: Secondary | ICD-10-CM | POA: Diagnosis not present

## 2020-06-24 DIAGNOSIS — R0789 Other chest pain: Secondary | ICD-10-CM | POA: Diagnosis not present

## 2020-06-24 DIAGNOSIS — R079 Chest pain, unspecified: Secondary | ICD-10-CM | POA: Diagnosis not present

## 2020-06-24 DIAGNOSIS — Z743 Need for continuous supervision: Secondary | ICD-10-CM | POA: Diagnosis not present

## 2020-06-24 LAB — CBC WITH DIFFERENTIAL/PLATELET
Abs Immature Granulocytes: 0.01 10*3/uL (ref 0.00–0.07)
Basophils Absolute: 0 10*3/uL (ref 0.0–0.1)
Basophils Relative: 0 %
Eosinophils Absolute: 0.1 10*3/uL (ref 0.0–0.5)
Eosinophils Relative: 2 %
HCT: 30.9 % — ABNORMAL LOW (ref 39.0–52.0)
Hemoglobin: 9.9 g/dL — ABNORMAL LOW (ref 13.0–17.0)
Immature Granulocytes: 0 %
Lymphocytes Relative: 28 %
Lymphs Abs: 1.4 10*3/uL (ref 0.7–4.0)
MCH: 34 pg (ref 26.0–34.0)
MCHC: 32 g/dL (ref 30.0–36.0)
MCV: 106.2 fL — ABNORMAL HIGH (ref 80.0–100.0)
Monocytes Absolute: 0.8 10*3/uL (ref 0.1–1.0)
Monocytes Relative: 16 %
Neutro Abs: 2.8 10*3/uL (ref 1.7–7.7)
Neutrophils Relative %: 54 %
Platelets: 175 10*3/uL (ref 150–400)
RBC: 2.91 MIL/uL — ABNORMAL LOW (ref 4.22–5.81)
RDW: 13.2 % (ref 11.5–15.5)
WBC: 5.1 10*3/uL (ref 4.0–10.5)
nRBC: 0 % (ref 0.0–0.2)

## 2020-06-24 LAB — COMPREHENSIVE METABOLIC PANEL
ALT: 13 U/L (ref 0–44)
AST: 18 U/L (ref 15–41)
Albumin: 2.6 g/dL — ABNORMAL LOW (ref 3.5–5.0)
Alkaline Phosphatase: 59 U/L (ref 38–126)
Anion gap: 9 (ref 5–15)
BUN: 19 mg/dL (ref 8–23)
CO2: 22 mmol/L (ref 22–32)
Calcium: 6.6 mg/dL — ABNORMAL LOW (ref 8.9–10.3)
Chloride: 108 mmol/L (ref 98–111)
Creatinine, Ser: 4.52 mg/dL — ABNORMAL HIGH (ref 0.61–1.24)
GFR, Estimated: 12 mL/min — ABNORMAL LOW (ref >60)
Glucose, Bld: 88 mg/dL (ref 70–99)
Potassium: 4 mmol/L (ref 3.5–5.1)
Sodium: 139 mmol/L (ref 135–145)
Total Bilirubin: 0.6 mg/dL (ref 0.3–1.2)
Total Protein: 4.8 g/dL — ABNORMAL LOW (ref 6.5–8.1)

## 2020-06-24 LAB — TROPONIN I (HIGH SENSITIVITY)
Troponin I (High Sensitivity): 13 ng/L (ref <18)
Troponin I (High Sensitivity): 12 ng/L (ref <18)

## 2020-06-24 MED ORDER — IOHEXOL 350 MG/ML SOLN
125.0000 mL | Freq: Once | INTRAVENOUS | Status: AC | PRN
  Administered 2020-06-24: 125 mL via INTRAVENOUS

## 2020-06-24 NOTE — ED Triage Notes (Signed)
Reports weakness, shortness of breath and intermittent chest pain that started today. Alert and oriented x 4. Hx of HD.

## 2020-06-24 NOTE — ED Provider Notes (Signed)
[facility] EMERGENCY DEPARTMENT Provider Note   CSN: [id number] Arrival date & time: 06/24/20  1730     History Chief Complaint  Patient presents with  . Shortness of Breath    Elijah White is a 80 y.o. male.  The history is provided by the patient and medical records.  Shortness of Breath  Elijah White is a 80 y.o. male who presents to the Emergency Department complaining of [place] of breath. He presents the emergency department complaining of shortness of breath that happened one hour prior to ED presentation. He lives at home alone and has a history of ESR D on hemodialysis. His last dialysis session was yesterday and went uneventfully. He dialysis Tuesday, Thursday, Saturday. He states that when that shortness of breath develops he did have some light left-sided chest discomfort. Overall his symptoms are improving but persistent. Denies any fevers, abdominal pain, nausea, vomiting. He does have neuropathy to bilateral lower extremities and states that this is bothering him as well. He does take Plavix. No history of blood clots. He has experience in her symptoms in the past.    Past Medical History:  Diagnosis Date  . Anemia    low iron  . Arthritis   . Cancer [facility]) ?2006   prostate  . Colon polyps ~ 1993 and 2003   Dr [name], [facility].  08/2001 colonoscopy: tubular adenoma at cecum.    . 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 Elevated cholesterol with high triglycerides   . ESRD (end stage renal disease) ([place])    [facility]  . GERD (gastroesophageal reflux disease)   . Hypertension   . Legally blind in left eye, as defined in [place]    has pinpoint vision in right eye  . RBBB     Patient Active Problem List   Diagnosis Date Noted  . 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  . 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 RBBB   . Near  syncope 12/13/2019  . Severe aortic stenosis   . ESRD (end stage renal disease) ([place]) 11/26/2019  . Macrocytic anemia 11/26/2019  . Adrenal mass ([place]) 11/26/2019  . 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 ARF (acute renal failure) ([place]) 03/25/2018  . Vision loss of right eye 11/11/2017  . Bilateral pseudophakia 07/11/2017  . GERD (gastroesophageal reflux disease) 09/10/2016  . HLD (hyperlipidemia) 09/10/2016  . Essential hypertension 08/13/2016  . 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  . Type 2 diabetes mellitus with renal manifestations ([place]) 10/22/2006  . BPH (benign prostatic hyperplasia) 10/22/2006  . COLONIC POLYPS, HX OF 10/22/2006    Past Surgical History:  Procedure Laterality Date  . AV FISTULA PLACEMENT Left 03/20/2018   Procedure: ARTERIOVENOUS (AV) FISTULA CREATION ARM;  Surgeon: [name], MD;  Location: [place];  Service: Vascular;  Laterality: Left;  . BASCILIC VEIN TRANSPOSITION Left 05/21/2018   Procedure: LEFT BASILIC VEIN FISTULA SECOND STAGE;  Surgeon: [name], MD;  Location: [facility];  Service: Vascular;  Laterality: Left;  . COLONOSCOPY    . COLONOSCOPY WITH PROPOFOL N/A 11/30/2019   Procedure: COLONOSCOPY WITH PROPOFOL;  Surgeon: Ronnette Juniper, MD;  Location: [place] [place];  Service: Gastroenterology;  Laterality: N/A;  . GLAUCOMA SURGERY  2019   multiple surgeries  . HEMOSTASIS CLIP PLACEMENT  11/30/2019   Procedure: HEMOSTASIS CLIP PLACEMENT;  Surgeon: Ronnette Juniper, MD;  Location: [facility]  ENDOSCOPY;  Service: Gastroenterology;;  . KNEE ARTHROSCOPY    . MULTIPLE EXTRACTIONS WITH ALVEOLOPLASTY N/A 12/30/2019   Procedure: MULTIPLE EXTRACTION WITH ALVEOLOPLASTY;  Surgeon: Charlaine Dalton, DMD;  Location: [facility];  Service: Dentistry;  Laterality: N/A;  . POLYPECTOMY  11/30/2019   Procedure: POLYPECTOMY;  Surgeon: Ronnette Juniper, MD;  Location: [place];  Service: Gastroenterology;;  . PROSTATE SURGERY    . RIGHT/LEFT HEART CATH AND CORONARY ANGIOGRAPHY  N/A 12/03/2019   Procedure: RIGHT/LEFT HEART CATH AND CORONARY ANGIOGRAPHY;  Surgeon: Burnell Blanks, MD;  Location: [place] CV LAB;  Service: Cardiovascular;  Laterality: N/A;  . SUBMUCOSAL TATTOO INJECTION  11/30/2019   Procedure: SUBMUCOSAL TATTOO INJECTION;  Surgeon: Ronnette Juniper, MD;  Location: [facility] ENDOSCOPY;  Service: Gastroenterology;;  . TEE WITHOUT CARDIOVERSION N/A 01/19/2020   Procedure: TRANSESOPHAGEAL ECHOCARDIOGRAM (TEE);  Surgeon: Burnell Blanks, MD;  Location: [place] CV LAB;  Service: Open Heart Surgery;  Laterality: N/A;  . TRANSCATHETER AORTIC VALVE REPLACEMENT, TRANSFEMORAL Left 01/19/2020   Procedure: TRANSCATHETER AORTIC VALVE REPLACEMENT, LEFT TRANSFEMORAL;  Surgeon: Burnell Blanks, MD;  Location: [place] CV LAB;  Service: Open Heart Surgery;  Laterality: Left;       Family History  Problem Relation Age of Onset  . CAD Mother     Social History   Tobacco Use  . Smoking status: Former Smoker    Packs/day: 0.50    Quit date: 03/23/2018    Years since quitting: 2.2  . Smokeless tobacco: Never Used  Vaping Use  . Vaping Use: Never used  Substance Use Topics  . Alcohol use: No  . Drug use: No    Home Medications Prior to Admission medications   Medication Sig Start Date End Date Taking? Authorizing Provider  acetaminophen (TYLENOL) 500 MG tablet Take 2 tablets (1,000 mg total) by mouth every 6 (six) hours as needed for mild pain, moderate pain or headache. 11/20/19   Zigmund Gottron, NP  apraclonidine (IOPIDINE) 0.5 % ophthalmic solution Place 1 drop into the right eye 3 (three) times daily.  08/04/19   [provider]  atorvastatin (LIPITOR) 10 MG tablet TAKE 1 TABLET BY MOUTH EVERY DAY 06/09/20   [name], MD  B Complex-C-Folic Acid (DIALYVITE 774) 0.8 MG TABS Take 0.8 mg by mouth daily.  05/08/19   [provider]  blood glucose meter kit and supplies KIT Dispense based on patient and insurance preference.  Use up to four times daily as directed. (FOR ICD-9 250.00, 250.01). For QAC - HS accuchecks. 03/30/18   Thurnell Lose, MD  carvedilol (COREG) 3.125 MG tablet TAKE 1 TABLET(3.125 MG) BY MOUTH TWICE DAILY WITH A MEAL 06/09/20   [name], MD  clopidogrel (PLAVIX) 75 MG tablet Take 1 tablet (75 mg total) by mouth daily with breakfast. 06/01/20   O'Neal, Cassie Freer, MD  dorzolamide-timolol (COSOPT) 22.3-6.8 MG/ML ophthalmic solution Place 1 drop into the right eye 2 (two) times daily. 06/25/17   [provider]  gabapentin (NEURONTIN) 100 MG capsule Take 1 capsule (100 mg total) by mouth at bedtime. 01/22/20   Minette Brine, FNP  hydroxypropyl methylcellulose / hypromellose (ISOPTO TEARS / GONIOVISC) 2.5 % ophthalmic solution Place 1 drop into the left eye as needed for dry eyes.    [provider]  isosorbide mononitrate (IMDUR) 30 MG 24 hr tablet TAKE 1 TABLET(30 MG) BY MOUTH DAILY 06/09/20   [name], MD  Lancets (ONETOUCH DELICA PLUS FSELTR32Y) [place]  03/31/18  [provider]  latanoprost (XALATAN) 0.005 % ophthalmic solution Place 1 drop into the right eye at bedtime.    [provider]  [facility] VERIO test strip USE AS DIRECTED TO TEST FOUR TIMES A DAY 07/31/18   [name], MD  pantoprazole (PROTONIX) 40 MG tablet Take 1 tablet (40 mg total) by mouth daily at 12 noon. 01/21/20   Eileen Stanford, PA-C  pilocarpine (PILOCAR) 4 % ophthalmic solution Place 1 drop into the right eye 4 (four) times daily.     [provider]    Allergies    Patient has no known allergies.  Review of Systems   Review of Systems  Respiratory: Positive for shortness of breath.   All other systems reviewed and are negative.   Physical Exam Updated Vital Signs BP 131/65 (BP Location: Right Arm)   Pulse 68   Temp 97.9 F (36.6 C) (Oral)   Resp 20   Ht _0  (1.651 m)   Wt 74.4 kg   SpO2 99%   BMI 27.29 kg/m   Physical Exam Vitals and nursing  note reviewed.  Constitutional:      Appearance: He is well-developed.  HENT:     Head: Normocephalic and atraumatic.  Cardiovascular:     Rate and Rhythm: Normal rate and regular rhythm.     Heart sounds: No murmur heard.   Pulmonary:     Effort: Pulmonary effort is normal. No respiratory distress.     Breath sounds: Normal breath sounds.  Abdominal:     Palpations: Abdomen is soft.     Tenderness: There is no abdominal tenderness. There is no guarding or rebound.  Musculoskeletal:        General: No swelling or tenderness.     Comments: Fistula in the left upper extremity with palpable thrill. 2+ right DP pulse. One plus left DP pulse. Left foot is cool in comparison to the right foot but has brisk cap refill.   Skin:    General: Skin is warm and dry.  Neurological:     Mental Status: He is alert and oriented to person, place, and time.  Psychiatric:        Behavior: Behavior normal.     ED Results / Procedures / Treatments   Labs (all labs ordered are listed, but only abnormal results are displayed) Labs Reviewed  COMPREHENSIVE METABOLIC PANEL - Abnormal; Notable for the following components:      Result Value   Creatinine, Ser 4.52 (*)    Calcium 6.6 (*)    Total Protein 4.8 (*)    Albumin 2.6 (*)    GFR, Estimated 12 (*)    All other components within normal limits  CBC WITH DIFFERENTIAL/PLATELET - Abnormal; Notable for the following components:   RBC 2.91 (*)    Hemoglobin 9.9 (*)    HCT 30.9 (*)    MCV 106.2 (*)    All other components within normal limits  TROPONIN I (HIGH SENSITIVITY)  TROPONIN I (HIGH SENSITIVITY)    EKG EKG Interpretation  Date/Time:  Friday June 24 2020 17:51:15 EDT Ventricular Rate:  67 PR Interval:  137 QRS Duration: 129 QT Interval:  430 QTC Calculation: 038 R Axis:   57 Text Interpretation: Sinus rhythm Probable left atrial enlargement Right bundle branch block Confirmed by Quintella Reichert [phone]) on 06/24/2020 6:11:19  PM   Radiology DG Chest 2 View  Result Date: 06/24/2020 CLINICAL DATA:  Weakness, shortness of breath, and chest pain starting  today. Last dialysis yesterday. EXAM: CHEST - 2 VIEW COMPARISON:  01/19/2020 FINDINGS: Shallow inspiration. Heart size and pulmonary vascularity are normal. Cardiac valve prosthesis. Lungs are clear. No pleural effusions. No pneumothorax. Surgical clips in the left axilla. Degenerative changes in the spine and shoulders. IMPRESSION: No active cardiopulmonary disease. Electronically Signed   By: Lucienne Capers M.D.   On: 06/24/2020 19:22    Procedures Procedures   Medications Ordered in ED Medications - No data to display  ED Course  I have reviewed the triage vital signs and the nursing notes.  Pertinent labs & imaging results that were available during my care of the patient were reviewed by me and considered in my medical decision making (see chart for details).    MDM Rules/Calculators/A&P                         patient here for evaluation of shortness of breath and chest Discomfort. EKG is similar when compared to priors. Troponins are negative times two.  Presentation is not c/w ACS,PE.  On reassessment patient remarks that he has had coolness to his left foot for the last several days. He does have a chronic altered sensation to his feet. Given the reports of change to his foot compared to baseline will obtain CT aortagram with runoff to further evaluate flow to LLE.  Pt care transferred pending CTA.    Final Clinical Impression(s) / ED Diagnoses Final diagnoses:  None    Rx / DC Orders ED Discharge Orders    None       Quintella Reichert, MD 06/24/20 2346

## 2020-06-25 ENCOUNTER — Encounter (HOSPITAL_COMMUNITY): Payer: Self-pay | Admitting: *Deleted

## 2020-06-25 ENCOUNTER — Other Ambulatory Visit: Payer: Self-pay

## 2020-06-25 ENCOUNTER — Emergency Department (HOSPITAL_COMMUNITY)
Admission: EM | Admit: 2020-06-25 | Discharge: 2020-06-26 | Disposition: A | Discharge status: Home / Self Care | Payer: Medicare Other | Source: Home / Self Care | Attending: Emergency Medicine | Admitting: Emergency Medicine

## 2020-06-25 DIAGNOSIS — E1140 Type 2 diabetes mellitus with diabetic neuropathy, unspecified: Secondary | ICD-10-CM | POA: Insufficient documentation

## 2020-06-25 DIAGNOSIS — N186 End stage renal disease: Secondary | ICD-10-CM | POA: Insufficient documentation

## 2020-06-25 DIAGNOSIS — I132 Hypertensive heart and chronic kidney disease with heart failure and with stage 5 chronic kidney disease, or end stage renal disease: Secondary | ICD-10-CM | POA: Insufficient documentation

## 2020-06-25 DIAGNOSIS — R5381 Other malaise: Secondary | ICD-10-CM | POA: Diagnosis not present

## 2020-06-25 DIAGNOSIS — I5033 Acute on chronic diastolic (congestive) heart failure: Secondary | ICD-10-CM | POA: Insufficient documentation

## 2020-06-25 DIAGNOSIS — N2581 Secondary hyperparathyroidism of renal origin: Secondary | ICD-10-CM | POA: Diagnosis not present

## 2020-06-25 DIAGNOSIS — Z79899 Other long term (current) drug therapy: Secondary | ICD-10-CM | POA: Insufficient documentation

## 2020-06-25 DIAGNOSIS — E1122 Type 2 diabetes mellitus with diabetic chronic kidney disease: Secondary | ICD-10-CM | POA: Insufficient documentation

## 2020-06-25 DIAGNOSIS — M79604 Pain in right leg: Secondary | ICD-10-CM

## 2020-06-25 DIAGNOSIS — Z8546 Personal history of malignant neoplasm of prostate: Secondary | ICD-10-CM | POA: Insufficient documentation

## 2020-06-25 DIAGNOSIS — Z87891 Personal history of nicotine dependence: Secondary | ICD-10-CM | POA: Insufficient documentation

## 2020-06-25 DIAGNOSIS — M79605 Pain in left leg: Secondary | ICD-10-CM

## 2020-06-25 DIAGNOSIS — Z992 Dependence on renal dialysis: Secondary | ICD-10-CM | POA: Diagnosis not present

## 2020-06-25 DIAGNOSIS — Z7902 Long term (current) use of antithrombotics/antiplatelets: Secondary | ICD-10-CM | POA: Insufficient documentation

## 2020-06-25 DIAGNOSIS — Z743 Need for continuous supervision: Secondary | ICD-10-CM | POA: Diagnosis not present

## 2020-06-25 LAB — RESP PANEL BY RT-PCR (FLU A&B, COVID) ARPGX2
Influenza A by PCR: NEGATIVE
Influenza B by PCR: NEGATIVE
SARS Coronavirus 2 by RT PCR: NEGATIVE

## 2020-06-25 MED ORDER — HEPARIN (PORCINE) 25000 UT/250ML-% IV SOLN: 1000.0000 [IU]/h | INTRAVENOUS | Status: DC

## 2020-06-25 MED ORDER — OXYCODONE-ACETAMINOPHEN 5-325 MG PO TABS
1.0000 | ORAL_TABLET | Freq: Once | ORAL | Status: AC
  Administered 2020-06-25: 1 via ORAL
  Filled 2020-06-25: qty 1

## 2020-06-25 MED ORDER — HEPARIN BOLUS VIA INFUSION
4000.0000 [IU] | Freq: Once | INTRAVENOUS | Status: DC
  Filled 2020-06-25: qty 4000

## 2020-06-25 NOTE — ED Provider Notes (Addendum)
Received in sign out cool LLE,  CTA pending  Physical Exam  BP 131/65 (BP Location: Right Arm)   Pulse 68   Temp 97.9 F (36.6 C) (Oral)   Resp 20   Ht 5\' 5"  (1.651 m)   Wt 74.4 kg   SpO2 99%   BMI 27.29 kg/m   Physical Exam NCAT RRR CTAB NAbs ED Course/Procedures      Medications  heparin ADULT infusion 100 units/mL (25000 units/232mL) (has no administration in time range)  iohexol (OMNIPAQUE) 350 MG/ML injection 125 mL (125 mLs Intravenous Contrast Given 06/24/20 2347)     Procedures MDM Reviewed: nursing note and vitals Interpretation: CT scan and labs Total time providing critical care: 30-74 minutes (heparin drip ). This excludes time spent performing separately reportable procedures and services. Consults: vascular surgery   CRITICAL CARE Performed by: Danne Scardina K Elveria Lauderbaugh-Rasch Total critical care time:31  minutes Critical care time was exclusive of separately billable procedures and treating other patients. Critical care was necessary to treat or prevent imminent or life-threatening deterioration. Critical care was time spent personally by me on the following activities: development of treatment plan with patient and/or surrogate as well as nursing, discussions with consultants, evaluation of patient's response to treatment, examination of patient, obtaining history from patient or surrogate, ordering and performing treatments and interventions, ordering and review of laboratory studies, ordering and review of radiographic studies, pulse oximetry and re-evaluation of patient's condition.  MDM  1237 am Case D/w Dr. Luan Pulling who will see the patient        Seen by vascular surgery.  Stable for discharge with close follow up per Dr. Cecilio Asper, Liese Dizdarevic, MD 06/25/20 0210

## 2020-06-25 NOTE — ED Notes (Signed)
All appropriate discharge materials reviewed at length with patient. Time for questions provided. Pt has no other questions at this time and verbalizes understanding of all provided materials.  

## 2020-06-25 NOTE — Progress Notes (Signed)
ANTICOAGULATION CONSULT NOTE - Initial Consult  Pharmacy Consult for Heparin Indication: LLE occlusion   No Known Allergies  Patient Measurements: Height: 5\' 5"  (165.1 cm) Weight: 74.4 kg (164 lb) IBW/kg (Calculated) : 61.5  Vital Signs: Temp: 97.9 F (36.6 C) (04/22 1928) Temp Source: Oral (04/22 1928) BP: 131/65 (04/22 2230) Pulse Rate: 68 (04/22 2230)  Labs: Recent Labs    06/24/20 1731 06/24/20 1931  HGB 9.9*  --   HCT 30.9*  --   PLT 175  --   CREATININE 4.52*  --   TROPONINIHS 13 12    Estimated Creatinine Clearance: 12.1 mL/min (A) (by C-G formula based on SCr of 4.52 mg/dL (H)).   Medical History: Past Medical History:  Diagnosis Date  . Anemia    low iron  . Arthritis   . Cancer [facility]) ?2006   prostate  . Colon polyps ~ 1993 and 2003   Dr [name], [facility].  08/2001 colonoscopy: tubular adenoma at cecum.    . 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 Elevated cholesterol with high triglycerides   . ESRD (end stage renal disease) ([place])    [facility] - [facility]  . GERD (gastroesophageal reflux disease)   . Hypertension   . Legally blind in left eye, as defined in [place]    has pinpoint vision in right eye  . RBBB      Assessment: 80 y/o M with to begin heparin for LLE occlusion. Hgb 9.9. Plts good. ESRD on HD. PTA meds reviewed.   Goal of Therapy:  Heparin level 0.3-0.7 units/ml Monitor platelets by anticoagulation protocol: Yes   Plan:  Heparin 4000 units BOLUS Start heparin drip at 1000 units/hr 1000 Heparin level Daily CBC/Heparin level Monitor for bleeding  Narda Bonds, PharmD, BCPS Clinical Pharmacist Phone: [phone]

## 2020-06-25 NOTE — ED Triage Notes (Signed)
The pt

## 2020-06-25 NOTE — Consult Note (Signed)
VASCULAR [facility] VEIN SPECIALISTS OF   ASSESSMENT / PLAN: Elijah White is a 80 y.o. male with atherosclerosis of native arteries of left lower extremity causing coolness and occasional discomfort.  Patient counseled patients with asymptomatic peripheral arterial disease or claudication have a 1-2% risk of developing chronic limb threatening ischemia, but a 15-30% risk of mortality in the next 5 years. Intervention should only be considered for medically optimized patients with disabling symptoms. .  Recommend the following which can slow the progression of atherosclerosis and reduce the risk of major adverse cardiac / limb events:  Complete cessation from all tobacco products. Blood glucose control with goal A1c < 7%. Blood pressure control with goal blood pressure < 140/90 mmHg. Lipid reduction therapy with goal LDL-C <100 mg/dL (<70 if symptomatic from PAD).  Aspirin 67m PO QD.  Atorvastatin 40-80mg PO QD (or other "high intensity" statin therapy).  Plan outpatient follow up with non-invasive vascular labs in next 1-2 weeks on non-dialysis days.   CHIEF COMPLAINT: shortness of breath  HISTORY OF PRESENT ILLNESS: Elijah RIELis a 80y.o. 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  He is status post TAVR for severe aortic stenosis.  [facility] earlier this evening via EMS for evaluation of shortness of breath.  Patient lives alone. Per daughter's report his left foot has been cool.  He has dealt with intermittent pain and diminished sensation attributed to peripheral neuropathy for some time.  He does not walk enough to claudicate, but he is ambulatory with the assistance of a walker about his house he is legally blind and unsteady on his feet which also limits his ability to walk.  He does not describe typical symptoms of ischemic rest pain of the left lower extremity.  He does not  have any ulceration about the left foot.  Past Medical History:  Diagnosis Date  . Anemia    low iron  . Arthritis   . Cancer ([facility] ?2006   prostate  . Colon polyps ~ 1993 and 2003   Dr JTeena Irani [facility].  08/2001 colonoscopy: tubular adenoma at cecum.    . 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 Elevated cholesterol with high triglycerides   . [place]    [facility]  . GERD (gastroesophageal reflux disease)   . Hypertension   . Legally blind in left eye, [place]   has pinpoint vision in right eye  . RBBB     Past Surgical History:  Procedure Laterality Date  . AV FISTULA PLACEMENT Left 03/20/2018   Procedure: ARTERIOVENOUS (AV) FISTULA CREATION ARM;  Surgeon: CWaynetta Sandy MD;  [place]  Service: [place];  Laterality: Left;  . BASCILIC VEIN TRANSPOSITION Left 05/21/2018   Procedure: LEFT BASILIC VEIN FISTULA SECOND STAGE;  Surgeon: CWaynetta Sandy MD;  [place]  Service: Vascular;  Laterality: Left;  . COLONOSCOPY    . COLONOSCOPY WITH PROPOFOL N/A 11/30/2019   Procedure: COLONOSCOPY WITH PROPOFOL;  Surgeon: KRonnette Juniper MD;  [place]  Service: Gastroenterology;  Laterality: N/A;  . GLAUCOMA SURGERY  2019   multiple surgeries  . HEMOSTASIS CLIP PLACEMENT  11/30/2019   Procedure: HEMOSTASIS CLIP PLACEMENT;  Surgeon: KRonnette Juniper MD;  [place]  Service: Gastroenterology;;  . KNEE ARTHROSCOPY    .  MULTIPLE EXTRACTIONS WITH ALVEOLOPLASTY N/A 12/30/2019   Procedure: MULTIPLE EXTRACTION WITH ALVEOLOPLASTY;  Surgeon: Charlaine Dalton, DMD;  Location: [place];  Service: Dentistry;  Laterality: N/A;  . POLYPECTOMY  11/30/2019   Procedure: POLYPECTOMY;  Surgeon: Ronnette Juniper, MD;  Location: [place];  Service: Gastroenterology;;  . PROSTATE SURGERY    . RIGHT/LEFT HEART CATH AND CORONARY ANGIOGRAPHY N/A 12/03/2019   Procedure:  RIGHT/LEFT HEART CATH AND CORONARY ANGIOGRAPHY;  Surgeon: Burnell Blanks, MD;  Location: [place] CV [facility];  Service: Cardiovascular;  Laterality: N/A;  . SUBMUCOSAL TATTOO INJECTION  11/30/2019   Procedure: SUBMUCOSAL TATTOO INJECTION;  Surgeon: Ronnette Juniper, MD;  Location: [facility] END[facility];  Service: Gastroenterology;;  . TEE WITHOUT CARDIOVERSION N/A 01/19/2020   Procedure: TRANSESOPHAGEAL ECHOCARDIOGRAM (TEE);  Surgeon: Burnell Blanks, MD;  Location: [place] CV LAB;  Service: Open Heart Surgery;  Laterality: N/A;  . TRANSCATHETER AORTIC VALVE REPLACEMENT, TRANSFEMORAL Left 01/19/2020   Procedure: TRANSCATHETER AORTIC VALVE REPLACEMENT, LEFT TRANSFEMORAL;  Surgeon: Burnell Blanks, MD;  Location: [place] CV LAB;  Service: Open Heart Surgery;  Laterality: Left;    Family History  Problem Relation Age of Onset  . CAD Mother     Social History   Socioeconomic History  . Marital status: Widowed    Spouse name: Not on file  . Number of children: Not on file  . Years of education: Not on file  . Highest education level: Not on file  Occupational History  . Occupation: retired  Tobacco Use  . Smoking status: Former Smoker    Packs/day: 0.50    Quit date: 03/23/2018    Years since quitting: 2.2  . Smokeless tobacco: Never Used  Vaping Use  . Vaping Use: Never used  Substance and Sexual Activity  . Alcohol use: No  . Drug use: No  . Sexual activity: Not Currently  Other Topics Concern  . Not on file  Social History Narrative  . Not on file   Social Determinants of Health   Financial Resource Strain: Not on file  Food Insecurity: No Food Insecurity  . Worried About Charity fundraiser in the Last Year: Never true  . Ran Out of Food in the Last Year: Never true  Transportation Needs: Not on file  Physical Activity: Not on file  Stress: Not on file  Social Connections: Not on file  Intimate Partner Violence: Not on file    No Known  Allergies  Current Facility-Administered Medications  Medication Dose Route Frequency Provider Last Rate Last Admin  . heparin ADULT infusion 100 units/mL (25000 units/270m)  1,000 Units/hr Intravenous Continuous Palumbo, April, MD       Current Outpatient Medications  Medication Sig Dispense Refill  . acetaminophen (TYLENOL) 500 MG tablet Take 2 tablets (1,000 mg total) by mouth every 6 (six) hours as needed for mild pain, moderate pain or headache. 60 tablet 0  . apraclonidine (IOPIDINE) 0.5 % ophthalmic solution Place 1 drop into the right eye 3 (three) times daily.     .Marland Kitchenatorvastatin (LIPITOR) 10 MG tablet TAKE 1 TABLET BY MOUTH EVERY DAY 90 tablet 0  . B Complex-C-Folic Acid (DIALYVITE 8798 0.8 MG TABS Take 0.8 mg by mouth daily.     . blood glucose meter kit and supplies KIT Dispense based on patient and insurance preference. Use up to four times daily as directed. (FOR ICD-9 250.00, 250.01). For QAC - HS accuchecks. 1 each 1  . carvedilol (COREG) 3.125 MG tablet  TAKE 1 TABLET(3.125 MG) BY MOUTH TWICE DAILY WITH A MEAL 180 tablet 1  . clopidogrel (PLAVIX) 75 MG tablet Take 1 tablet (75 mg total) by mouth daily with breakfast. 60 tablet 0  . dorzolamide-timolol (COSOPT) 22.3-6.8 MG/ML ophthalmic solution Place 1 drop into the right eye 2 (two) times daily.  11  . gabapentin (NEURONTIN) 100 MG capsule Take 1 capsule (100 mg total) by mouth at bedtime. 90 capsule 1  . hydroxypropyl methylcellulose / hypromellose (ISOPTO TEARS / GONIOVISC) 2.5 % ophthalmic solution Place 1 drop into the left eye as needed for dry eyes.    . isosorbide mononitrate (IMDUR) 30 MG 24 hr tablet TAKE 1 TABLET(30 MG) BY MOUTH DAILY 90 tablet 1  . Lancets (ONETOUCH DELICA PLUS IWOEHO12Y) MISC     . latanoprost (XALATAN) 0.005 % ophthalmic solution Place 1 drop into the right eye at bedtime.    [name] test strip USE AS DIRECTED TO TEST FOUR TIMES A DAY 100 each 3  . pantoprazole (PROTONIX) 40 MG tablet Take  1 tablet (40 mg total) by mouth daily at 12 noon. 90 tablet 1  . pilocarpine (PILOCAR) 4 % ophthalmic solution Place 1 drop into the right eye 4 (four) times daily.       REVIEW OF SYSTEMS:  _0  denotes positive finding, _1  denotes negative finding Cardiac  Comments:  Chest pain or chest pressure:    Shortness of breath upon exertion: x   Short of breath when lying flat:    Irregular heart rhythm:        Vascular    Pain in calf, thigh, or hip brought on by ambulation:    Pain in feet at night that wakes you up from your sleep:  x   Blood clot in your veins:    Leg swelling:         Pulmonary    Oxygen at home:    Productive cough:     Wheezing:         Neurologic    Sudden weakness in arms or legs:     Sudden numbness in arms or legs:     Sudden onset of difficulty speaking or slurred speech:    Temporary loss of vision in one eye:     Problems with dizziness:         Gastrointestinal    Blood in stool:     Vomited blood:         Genitourinary    Burning when urinating:     Blood in urine:        Psychiatric    Major depression:         Hematologic    Bleeding problems:    Problems with blood clotting too easily:        Skin    Rashes or ulcers:        Constitutional    Fever or chills:      PHYSICAL EXAM  Vitals:   06/24/20 2200 06/24/20 2230 06/25/20 0112 06/25/20 0113  BP: 121/63 131/65  129/73  Pulse: 67 68  69  Resp:  20  18  Temp:    97.9 F (36.6 C)  TempSrc:   Oral Oral  SpO2: 98% 99%  99%  Weight:      Height:        Constitutional: Chronically ill appearing.  No distress. Appears well nourished.  Neurologic: No visual acuity.  Otherwise cranial nerves intact.  No focal  findings.  No sensory loss. Psychiatric: Mood and affect symmetric and appropriate. Eyes: No icterus. No conjunctival pallor. Ears, nose, throat: mucous membranes moist. Midline trachea.  Cardiac: Regular rate and rhythm.  Respiratory: unlabored. Abdominal: soft,  non-tender, non-distended.  Peripheral vascular:  2+ femoral pulses bilaterally  No palpable popliteal pulses bilaterally  1+ right dorsalis pedis pulse  Biphasic left dorsalis pedis Doppler flow Extremity: No edema. No cyanosis. No pallor.  Skin: No gangrene. No ulceration.  Lymphatic: No Stemmer's sign. No palpable lymphadenopathy.  PERTINENT LABORATORY AND RADIOLOGIC DATA  Most recent CBC CBC Latest Ref Rng & Units 06/24/2020 01/20/2020 01/19/2020  WBC 4.0 - 10.5 K/uL 5.1 6.2 -  Hemoglobin 13.0 - 17.0 g/dL 9.9(L) 10.7(L) 10.9(L)  Hematocrit 39.0 - 52.0 % 30.9(L) 33.5(L) 32.0(L)  Platelets 150 - 400 K/uL 175 156 -     Most recent CMP CMP Latest Ref Rng & Units 06/24/2020 01/20/2020 01/19/2020  Glucose 70 - 99 mg/dL 88 74 142(H)  BUN 8 - 23 mg/dL 19 34(H) 31(H)  Creatinine 0.61 - 1.24 mg/dL 4.52(H) 8.19(H) 7.80(H)  Sodium 135 - 145 mmol/L 139 140 137  Potassium 3.5 - 5.1 mmol/L 4.0 4.9 5.1  Chloride 98 - 111 mmol/L 108 103 100  CO2 22 - 32 mmol/L 22 25 -  Calcium 8.9 - 10.3 mg/dL 6.6(L) 9.4 -  Total Protein 6.5 - 8.1 g/dL 4.8(L) - -  Total Bilirubin 0.3 - 1.2 mg/dL 0.6 - -  Alkaline Phos 38 - 126 U/L 59 - -  AST 15 - 41 U/L 18 - -  ALT 0 - 44 U/L 13 - -    Renal function Estimated Creatinine Clearance: 12.1 mL/min (A) (by C-G formula based on SCr of 4.52 mg/dL (H)).  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  He has good anterior tibial artery runoff to the foot.  Yevonne Aline. Stanford Breed, MD [facility] Phone Number: [phone] 06/25/2020 2:07 AM

## 2020-06-25 NOTE — ED Provider Notes (Addendum)
MSE was initiated and I personally evaluated the patient and placed orders (if any) at  10:12 PM on June 25, 2020.  Patient presents to ED with a chief complaint of bilateral leg pain.  Hx of diabetic neuropathy.  Seen early this morning for the same.  Vascular consulted and recommended outpatient follow up in the next 1-2 weeks.  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  Pain worsened today. Daughter, [name], says that he expressed suicidal thoughts today because the pain got so bad.  Shoes and stockings removed. Feet not significantly cool. Faint pedal pulses palpated bilaterally.   No sign of infection    Discussed with patient that their care has been initiated.   They are counseled that they will need to remain in the ED until the completion of their workup, including full H&P and results of any tests.  Risks of leaving the emergency department prior to completion of treatment were discussed. Patient was advised to inform ED staff if they are leaving before their treatment is complete. The patient acknowledged these risks and time was allowed for questions.    The patient appears stable so that the remainder of the MSE may be completed by another provider.     Montine Circle, PA-C 06/25/20 2245    Little, Wenda Overland, MD 06/26/20 [phone]

## 2020-06-26 DIAGNOSIS — M79604 Pain in right leg: Secondary | ICD-10-CM | POA: Diagnosis not present

## 2020-06-26 DIAGNOSIS — M79605 Pain in left leg: Secondary | ICD-10-CM | POA: Diagnosis not present

## 2020-06-26 MED ORDER — OXYCODONE-ACETAMINOPHEN 5-325 MG PO TABS: 1.0000 | ORAL_TABLET | Freq: Four times a day (QID) | ORAL | 0 refills | Status: DC | PRN

## 2020-06-26 MED ORDER — OXYCODONE-ACETAMINOPHEN 5-325 MG PO TABS
1.0000 | ORAL_TABLET | Freq: Once | ORAL | Status: AC
Start: 2020-06-26 — End: 2020-06-26
  Administered 2020-06-26: 1 via ORAL
  Filled 2020-06-26: qty 1

## 2020-06-26 NOTE — ED Provider Notes (Signed)
[facility] EMERGENCY DEPARTMENT Provider Note   CSN: [id number] Arrival date & time: 06/25/20  2042     History Chief Complaint  Patient presents with  . Leg Pain    Elijah White is a 80 y.o. male.  Patient presents to ED with a chief complaint of bilateral leg pain.  Hx of diabetic neuropathy.  Seen early this morning for the same.  Vascular consulted and recommended outpatient follow up in the next 1-2 weeks.  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  Pain worsened today. Daughter, Elijah White, says that he expressed suicidal thoughts today because the pain got so bad.  The history is provided by the patient. No language interpreter was used.       Past Medical History:  Diagnosis Date  . Anemia    low iron  . Arthritis   . Cancer [facility]) ?2006   prostate  . Colon polyps ~ 1993 and 2003   Dr [name], [facility].  08/2001 colonoscopy: tubular adenoma at cecum.    . 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 Elevated cholesterol with high triglycerides   . ESRD (end stage renal disease) ([place])    [facility]  . GERD (gastroesophageal reflux disease)   . Hypertension   . Legally blind in left eye, as defined in [place]    has pinpoint vision in right eye  . RBBB     Patient Active Problem List   Diagnosis Date Noted  . 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  . Acute on chronic diastolic heart failure ([place]) 01/19/2020  . 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 RBBB   . Near syncope 12/13/2019  . Severe aortic stenosis   . ESRD (end stage renal disease) ([place]) 11/26/2019  . Macrocytic anemia 11/26/2019  . Adrenal mass ([place]) 11/26/2019  . 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 ARF (acute renal failure) ([place]) 03/25/2018  . Vision loss of right eye 11/11/2017  . Bilateral pseudophakia 07/11/2017  . GERD  (gastroesophageal reflux disease) 09/10/2016  . HLD (hyperlipidemia) 09/10/2016  . Essential hypertension 08/13/2016  . 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  . Type 2 diabetes mellitus with renal manifestations ([place]) 10/22/2006  . BPH (benign prostatic hyperplasia) 10/22/2006  . COLONIC POLYPS, HX OF 10/22/2006    Past Surgical History:  Procedure Laterality Date  . AV FISTULA PLACEMENT Left 03/20/2018   Procedure: ARTERIOVENOUS (AV) FISTULA CREATION ARM;  Surgeon: [name], MD;  Location: [place];  Service: Vascular;  Laterality: Left;  . BASCILIC VEIN TRANSPOSITION Left 05/21/2018   Procedure: LEFT BASILIC VEIN FISTULA SECOND STAGE;  Surgeon: [name], MD;  Location: [place];  Service: Vascular;  Laterality: Left;  . COLONOSCOPY    . COLONOSCOPY WITH PROPOFOL N/A 11/30/2019   Procedure: COLONOSCOPY WITH PROPOFOL;  Surgeon: Ronnette Juniper, MD;  Location: [place];  Service: Gastroenterology;  Laterality: N/A;  . GLAUCOMA SURGERY  2019   multiple surgeries  . HEMOSTASIS CLIP PLACEMENT  11/30/2019   Procedure: HEMOSTASIS CLIP PLACEMENT;  Surgeon: Ronnette Juniper, MD;  Location: [place];  Service: Gastroenterology;;  . KNEE ARTHROSCOPY    . MULTIPLE EXTRACTIONS WITH ALVEOLOPLASTY N/A 12/30/2019   Procedure: MULTIPLE EXTRACTION WITH ALVEOLOPLASTY;  Surgeon: Charlaine Dalton, DMD;  Location: [place];  Service: Dentistry;  Laterality: N/A;  . POLYPECTOMY  11/30/2019   Procedure:  POLYPECTOMY;  Surgeon: Ronnette Juniper, MD;  Location: [place];  Service: Gastroenterology;;  . PROSTATE SURGERY    . RIGHT/LEFT HEART CATH AND CORONARY ANGIOGRAPHY N/A 12/03/2019   Procedure: RIGHT/LEFT HEART CATH AND CORONARY ANGIOGRAPHY;  Surgeon: Burnell Blanks, MD;  Location: [place] [facility];  Service: Cardiovascular;  Laterality: N/A;  . SUBMUCOSAL TATTOO INJECTION  11/30/2019   Procedure: SUBMUCOSAL TATTOO INJECTION;  Surgeon: Ronnette Juniper, MD;  Location: [facility] ENDOSCOPY;   Service: Gastroenterology;;  . TEE WITHOUT CARDIOVERSION N/A 01/19/2020   Procedure: TRANSESOPHAGEAL ECHOCARDIOGRAM (TEE);  Surgeon: Burnell Blanks, MD;  Location: [place] [facility];  Service: Open Heart Surgery;  Laterality: N/A;  . TRANSCATHETER AORTIC VALVE REPLACEMENT, TRANSFEMORAL Left 01/19/2020   Procedure: TRANSCATHETER AORTIC VALVE REPLACEMENT, LEFT TRANSFEMORAL;  Surgeon: Burnell Blanks, MD;  Location: [place] [facility];  Service: Open Heart Surgery;  Laterality: Left;       Family History  Problem Relation Age of Onset  . CAD Mother     Social History   Tobacco Use  . Smoking status: Former Smoker    Packs/day: 0.50    Quit date: 03/23/2018    Years since quitting: 2.2  . Smokeless tobacco: Never Used  Vaping Use  . Vaping Use: Never used  Substance Use Topics  . Alcohol use: No  . Drug use: No    Home Medications Prior to Admission medications   Medication Sig Start Date End Date Taking? Authorizing Provider  acetaminophen (TYLENOL) 500 MG tablet Take 2 tablets (1,000 mg total) by mouth every 6 (six) hours as needed for mild pain, moderate pain or headache. 11/20/19   Zigmund Gottron, NP  apraclonidine (IOPIDINE) 0.5 % ophthalmic solution Place 1 drop into the right eye 3 (three) times daily.  08/04/19   [provider]  atorvastatin (LIPITOR) 10 MG tablet TAKE 1 TABLET BY MOUTH EVERY DAY 06/09/20   [name], MD  B Complex-C-Folic Acid (DIALYVITE 562) 0.8 MG TABS Take 0.8 mg by mouth daily.  05/08/19   [provider]  blood glucose meter kit and supplies KIT Dispense based on patient and insurance preference. Use up to four times daily as directed. (FOR ICD-9 250.00, 250.01). For QAC - HS accuchecks. 03/30/18   Thurnell Lose, MD  carvedilol (COREG) 3.125 MG tablet TAKE 1 TABLET(3.125 MG) BY MOUTH TWICE DAILY WITH A MEAL 06/09/20   [name], MD  clopidogrel (PLAVIX) 75 MG tablet Take 1 tablet (75 mg total) by mouth daily  with breakfast. 06/01/20   O'Neal, Cassie Freer, MD  dorzolamide-timolol (COSOPT) 22.3-6.8 MG/ML ophthalmic solution Place 1 drop into the right eye 2 (two) times daily. 06/25/17   [provider]  gabapentin (NEURONTIN) 100 MG capsule Take 1 capsule (100 mg total) by mouth at bedtime. 01/22/20   Minette Brine, FNP  hydroxypropyl methylcellulose / hypromellose (ISOPTO TEARS / GONIOVISC) 2.5 % ophthalmic solution Place 1 drop into the left eye as needed for dry eyes.    [provider]  isosorbide mononitrate (IMDUR) 30 MG 24 hr tablet TAKE 1 TABLET(30 MG) BY MOUTH DAILY 06/09/20   [name], MD  Lancets (ONETOUCH DELICA PLUS [id number]) [place]  03/31/18   [provider]  latanoprost (XALATAN) 0.005 % ophthalmic solution Place 1 drop into the right eye at bedtime.    [provider]  [facility] VERIO test strip USE AS DIRECTED TO TEST FOUR TIMES A DAY 07/31/18   [name], MD  pantoprazole ([place]) 40  MG tablet Take 1 tablet (40 mg total) by mouth daily at 12 noon. 01/21/20   Eileen Stanford, PA-C  pilocarpine (PILOCAR) 4 % ophthalmic solution Place 1 drop into the right eye 4 (four) times daily.     [provider]    Allergies    Patient has no known allergies.  Review of Systems   Review of Systems  All other systems reviewed and are negative.   Physical Exam Updated Vital Signs BP 109/71 (BP Location: Right Arm)   Pulse 75   Temp 98.4 F (36.9 C) (Oral)   Resp 17   Ht 5' 5"  (1.651 m)   Wt 74.4 kg   SpO2 98%   BMI 27.29 kg/m   Physical Exam Vitals and nursing note reviewed.  Constitutional:      Appearance: He is well-developed.  HENT:     Head: Normocephalic and atraumatic.  Eyes:     Conjunctiva/sclera: Conjunctivae normal.  Cardiovascular:     Rate and Rhythm: Normal rate and regular rhythm.     Pulses: Normal pulses.     Heart sounds: No murmur heard.     Comments: Intact distal pulses, faint, but palpable DP  pulses bilaterally, better appreciated on doppler Pulmonary:     Effort: Pulmonary effort is normal. No respiratory distress.     Breath sounds: Normal breath sounds.  Abdominal:     Palpations: Abdomen is soft.     Tenderness: There is no abdominal tenderness.  Musculoskeletal:        General: Normal range of motion.     Cervical back: Neck supple.  Skin:    General: Skin is warm and dry.  Neurological:     Mental Status: He is alert and oriented to person, place, and time.  Psychiatric:        Mood and Affect: Mood normal.        Behavior: Behavior normal.     ED Results / Procedures / Treatments   Labs (all labs ordered are listed, but only abnormal results are displayed) Labs Reviewed - No data to display  EKG None  Radiology DG Chest 2 View  Result Date: 06/24/2020 CLINICAL DATA:  Weakness, shortness of breath, and chest pain starting today. Last dialysis yesterday. EXAM: CHEST - 2 VIEW COMPARISON:  01/19/2020 FINDINGS: Shallow inspiration. Heart size and pulmonary vascularity are normal. Cardiac valve prosthesis. Lungs are clear. No pleural effusions. No pneumothorax. Surgical clips in the left axilla. Degenerative changes in the spine and shoulders. IMPRESSION: No active cardiopulmonary disease. Electronically Signed   By: Lucienne Capers M.D.   On: 06/24/2020 19:22   CT Angio Aortobifemoral W and/or Wo Contrast  Result Date: 06/25/2020 CLINICAL DATA:  Leg pain.  End-stage renal disease. EXAM: CT ANGIOGRAPHY OF ABDOMINAL AORTA WITH ILIOFEMORAL RUNOFF TECHNIQUE: Multidetector CT imaging of the abdomen, pelvis and lower extremities was performed using the standard protocol during bolus administration of intravenous contrast. Multiplanar CT image reconstructions and MIPs were obtained to evaluate the vascular anatomy. CONTRAST:  165m OMNIPAQUE IOHEXOL 350 MG/ML SOLN COMPARISON:  CT a abdomen 12/01/2019 FINDINGS: VASCULAR Aorta: Moderate to Addison's Morgan severe  atherosclerotic plaque. Normal caliber aorta without aneurysm, dissection, vasculitis or significant stenosis. Celiac: Atherosclerotic plaque. Patent without evidence of aneurysm, dissection, vasculitis or significant stenosis. SMA: Atherosclerotic plaque. Patent without evidence of aneurysm, dissection, vasculitis or significant stenosis. Renals: Severe atherosclerotic plaque of the left renal artery. Both renal arteries are patent without evidence of aneurysm, dissection, vasculitis, fibromuscular dysplasia  or significant stenosis. IMA: Patent without evidence of aneurysm, dissection, vasculitis or significant stenosis. RIGHT Lower Extremity Inflow: At least moderate calcified and noncalcified atherosclerotic plaque. Common, internal and external iliac arteries are patent without evidence of aneurysm, dissection, vasculitis or significant stenosis. Outflow: At least moderate calcified and noncalcified atherosclerotic plaque. Common, superficial and profunda femoral arteries and the popliteal artery are patent without evidence of aneurysm, dissection, vasculitis or significant stenosis. Runoff: 3 vessel runoff to the ankle with all 3 arteries intermittently occluded due to calcified and noncalcified atherosclerotic plaque. Vascular flow is noted to the foot. LEFT Lower Extremity Inflow: At least moderate calcified and noncalcified atherosclerotic plaque. Common, internal and external iliac arteries are patent without evidence of aneurysm, dissection, vasculitis or significant stenosis. Outflow: Severe calcified and noncalcified atherosclerotic plaque with occlusion of the popliteal artery with reconstitution distally. The occlusion extends from its origin to approximately 8 cm caudally. Common, superficial and profunda femoral arteries are patent without evidence of aneurysm, dissection, vasculitis or significant stenosis. Runoff: 3 vessel runoff to the ankle with all 3 arteries intermittently occluded due to  calcified and noncalcified atherosclerotic plaque. Vascular flow is noted to the foot. Veins: No obvious venous abnormality within the limitations of this arterial phase study. Review of the MIP images confirms the above findings. NON-VASCULAR Lower chest: Partially visualized aortic valve replacement. Bilateral lower lobe subsegmental atelectasis. No acute abnormality. Hepatobiliary: No focal liver abnormality. Layering hyperdensity within the gallbladder lumen consistent with gallstones. No gallbladder wall thickening, or pericholecystic fluid. No biliary dilatation. Pancreas: Diffusely atrophic. No focal lesion. Otherwise normal pancreatic contour. No surrounding inflammatory changes. No main pancreatic ductal dilatation. Spleen: Normal in size without focal abnormality. Adrenals/Urinary Tract: Stable 2.1 cm right adrenal gland nodule with a density of 59 Hounsfield units and stable 2.9 cm left adrenal gland nodule with a density of 85 Hounsfield units. Nonspecific bilateral renal cortical scarring. Bilateral kidneys enhance symmetrically. Bilateral renal cortical scarring and atrophy. Bilateral extrarenal pelvis with no hydronephrosis. No hydroureter. The urinary bladder is unremarkable. Stomach/Bowel: Stomach is within normal limits. No evidence of bowel wall thickening or dilatation. Appendix appears normal. Lymphatic: No lymphadenopathy. Reproductive: Prominent prostate. Radiation the CT noted within the prostate. Other: No intraperitoneal free fluid. No intraperitoneal free gas. No organized fluid collection. Musculoskeletal: Question tiny fat containing left inguinal hernia. Tiny fat containing umbilical hernia. No suspicious lytic or blastic osseous lesions. No acute displaced fracture. No hip dislocation. Multilevel degenerative changes of the spine. IMPRESSION: VASCULAR 1. Left popliteal atherosclerotic occlusion extending approximately 8 cm in the craniocaudal dimension with reconstitution distally. 2.  Bilateral three-vessel runoff with intermittent occlusion of the leg arterial vasculature due to calcified and noncalcified plaque. 3.  Aortic Atherosclerosis (ICD10-I70.0). NON-VASCULAR 1. Cholelithiasis with no acute cholecystitis. 2. Bilateral atrophic kidneys. 3. 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 4. Prominent prostate with radiation seeds noted. These results were called by telephone at the time of interpretation on 06/25/2020 at 12:31 am to provider Dr. Randal Buba, who verbally acknowledged these results. Electronically Signed   By: Iven Finn M.D.   On: 06/25/2020 00:44    Procedures Procedures   Medications Ordered in ED Medications  oxyCODONE-acetaminophen (PERCOCET/ROXICET) 5-325 MG per tablet 1 tablet (1 tablet Oral Given 06/25/20 2249)    ED Course  I have reviewed the triage vital signs and the nursing notes.  Pertinent labs & imaging results that were available during my care of the patient were reviewed  by me and considered in my medical decision making (see chart for details).    MDM Rules/Calculators/A&P                          Patient here with bilateral LE pain.  Has history of diabetic neuropathy.  Was seen earlier this morning, had CT lower extremity with runoff.  Had vascular consult.  Patient cleared by vascular.  Outpatient follow-up recommended in the next 1-2 weeks.  Patient told daughter that he wanted to kill himself.  No clear plan.  Daughter contracts for safety.  Case discussed with Dr. Leonette Monarch, who agrees with plan for discharge. Final Clinical Impression(s) / ED Diagnoses Final diagnoses:  Pain in both lower extremities    Rx / DC Orders ED Discharge Orders    None       Montine Circle, PA-C 06/26/20 0425    Fatima Blank, MD 06/26/20 [phone]

## 2020-06-27 ENCOUNTER — Telehealth: Payer: Self-pay

## 2020-06-27 NOTE — Telephone Encounter (Signed)
  Care Management    Consult Note  06/27/2020 Name: Elijah White MRN: [id number] DOB: 03-08-1940  Care management team received notification of patient's recent emergency department visit related to PAD .Based on review of health record, Elijah White is currently active in the embedded care coordination program.   Patient was seen in the ED on 4.22.22 and 4.23.22 for PAD and lower extremity pain.   Plan: Collaboration with RN Care Manager to inform of recent ED visits. Next planned outbound call with SW scheduled for 4.28.22.  [name], BSW, CDP Social Worker, Certified Dementia Practitioner [place] [facility] [phone]

## 2020-06-28 ENCOUNTER — Telehealth: Payer: Self-pay

## 2020-06-28 ENCOUNTER — Telehealth: Payer: Medicare Other

## 2020-06-28 DIAGNOSIS — N186 End stage renal disease: Secondary | ICD-10-CM | POA: Diagnosis not present

## 2020-06-28 DIAGNOSIS — Z992 Dependence on renal dialysis: Secondary | ICD-10-CM | POA: Diagnosis not present

## 2020-06-28 DIAGNOSIS — N2581 Secondary hyperparathyroidism of renal origin: Secondary | ICD-10-CM | POA: Diagnosis not present

## 2020-06-28 NOTE — Telephone Encounter (Signed)
  Chronic Care Management   Outreach Note  06/28/2020 Name: Elijah White MRN: [id number] DOB: Feb 06, 1941  Referred by: [name], MD Reason for referral : Chronic Care Management (Inbound call from caregiver )  Received a voice message from patient's daughter [name] requesting a return call. An unsuccessful telephone outreach was attempted today. The patient was referred to the case management team for assistance with care management and care coordination.   Follow Up Plan: A HIPAA compliant phone message was left for the patient providing contact information and requesting a return call. Telephone follow up appointment with care management team member scheduled for: 06/30/20  Barb Merino, RN, BSN, CCM Care Management Coordinator [place] [facility]  Direct Phone: [phone]

## 2020-06-28 NOTE — Telephone Encounter (Signed)
I called the the pt's daughter [name] and left a detailed message that Dr. Baird Cancer said that the recommended dose is 100-300mg  post dialysis on dialysis days only. ADVISE HER TO START WITH 2 CAPSULES ON DIALYSIS DAYS ONLY - THESE ARE RECOMMENDATIONS and for her to let Dr. Baird Cancer know how this is working for the pt.  I left on the voicemail for her to let the office know that she got the message for the pt.

## 2020-06-30 ENCOUNTER — Ambulatory Visit (INDEPENDENT_AMBULATORY_CARE_PROVIDER_SITE_OTHER): Payer: Medicare Other

## 2020-06-30 DIAGNOSIS — Z992 Dependence on renal dialysis: Secondary | ICD-10-CM

## 2020-06-30 DIAGNOSIS — N2581 Secondary hyperparathyroidism of renal origin: Secondary | ICD-10-CM | POA: Diagnosis not present

## 2020-06-30 DIAGNOSIS — N186 End stage renal disease: Secondary | ICD-10-CM

## 2020-06-30 DIAGNOSIS — E1122 Type 2 diabetes mellitus with diabetic chronic kidney disease: Secondary | ICD-10-CM | POA: Diagnosis not present

## 2020-06-30 DIAGNOSIS — I5032 Chronic diastolic (congestive) heart failure: Secondary | ICD-10-CM | POA: Diagnosis not present

## 2020-06-30 NOTE — Chronic Care Management (AMB) (Signed)
Chronic Care Management    Social Work Note  06/30/2020 Name: Elijah White MRN: [id number] DOB: 12-May-1940  Elijah White is a 80 y.o. year old male who is a primary care patient of [name], MD. The CCM team was consulted to assist the patient with chronic disease management and/or care coordination needs related to: Level of Care Concerns.   Engaged with patients daughter by phone for follow up visit in response to provider referral for social work chronic care management and care coordination services.   Consent to Services:  The patient was given information about Chronic Care Management services, agreed to services, and gave verbal consent prior to initiation of services.  Please see initial visit note for detailed documentation.   Patient agreed to services and consent obtained.   Assessment: Review of patient past medical history, allergies, medications, and health status, including review of relevant consultants reports was performed today as part of a comprehensive evaluation and provision of chronic care management and care coordination services.     SDOH (Social Determinants of Health) assessments and interventions performed:    Advanced Directives Status: Not addressed in this encounter.  CCM Care Plan  No Known Allergies  Outpatient Encounter Medications as of 06/30/2020  Medication Sig  . apraclonidine (IOPIDINE) 0.5 % ophthalmic solution Place 1 drop into the right eye 3 (three) times daily.   [name] atorvastatin (LIPITOR) 10 MG tablet TAKE 1 TABLET BY MOUTH EVERY DAY  . B Complex-C-Folic Acid (DIALYVITE 458) 0.8 MG TABS Take 0.8 mg by mouth daily.   . blood glucose meter kit and supplies KIT Dispense based on patient and insurance preference. Use up to four times daily as directed. (FOR ICD-9 250.00, 250.01). For QAC - HS accuchecks.  . carvedilol (COREG) 3.125 MG tablet TAKE 1 TABLET(3.125 MG) BY MOUTH TWICE DAILY WITH A MEAL  . clopidogrel (PLAVIX) 75 MG tablet  Take 1 tablet (75 mg total) by mouth daily with breakfast.  . dorzolamide-timolol (COSOPT) 22.3-6.8 MG/ML ophthalmic solution Place 1 drop into the right eye 2 (two) times daily.  [name] gabapentin (NEURONTIN) 100 MG capsule Take 1 capsule (100 mg total) by mouth at bedtime.  . hydroxypropyl methylcellulose / hypromellose (ISOPTO TEARS / GONIOVISC) 2.5 % ophthalmic solution Place 1 drop into the left eye as needed for dry eyes.  . isosorbide mononitrate (IMDUR) 30 MG 24 hr tablet TAKE 1 TABLET(30 MG) BY MOUTH DAILY  . Lancets (ONETOUCH DELICA PLUS [id number]) MISC   . latanoprost (XALATAN) 0.005 % ophthalmic solution Place 1 drop into the right eye at bedtime.  [name] test strip USE AS DIRECTED TO TEST FOUR TIMES A DAY  . oxyCODONE-acetaminophen (PERCOCET) 5-325 MG tablet Take 1-2 tablets by mouth every 6 (six) hours as needed.  . pantoprazole (PROTONIX) 40 MG tablet Take 1 tablet (40 mg total) by mouth daily at 12 noon.  . pilocarpine (PILOCAR) 4 % ophthalmic solution Place 1 drop into the right eye 4 (four) times daily.    No facility-administered encounter medications on file as of 06/30/2020.    Patient Active Problem List   Diagnosis Date Noted  . 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  . 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 RBBB   . Near syncope 12/13/2019  . Severe aortic stenosis   . ESRD (end stage renal disease) ([place]) 11/26/2019  . Macrocytic anemia 11/26/2019  . Adrenal mass ([place]) 11/26/2019  . 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 ARF (acute renal failure) ([place]) 03/25/2018  . Vision loss of right eye 11/11/2017  . Bilateral pseudophakia 07/11/2017  . GERD (gastroesophageal reflux disease) 09/10/2016  . HLD (hyperlipidemia) 09/10/2016  . Essential hypertension 08/13/2016  . 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  . Type 2 diabetes mellitus with renal manifestations ([place]) 10/22/2006  . BPH (benign prostatic  hyperplasia) 10/22/2006  . COLONIC POLYPS, HX OF 10/22/2006    Conditions to be addressed/monitored: DMII and ESRD; Level of care concerns  Care Plan : Social Work [facility] Care Plan  Updates made by [name] since 06/30/2020 12:00 AM    Problem: Disease Progression     Long-Range Goal: Disease Progression Prevented or Minimized   Start Date: 04/14/2020  Expected End Date: 08/12/2020  Priority: Low  Note:   Current Barriers:  . Chronic disease management support and education needs related to DM and ESRD  . ADL IADL limitations  Social Worker Clinical Goal(s):  [name] Over the next 120 days, patient will work with SW to identify and address any acute and/or chronic care coordination needs related to the self health management of DM and ESRD   CCM SW Interventions:  . Inter-disciplinary care team collaboration (see longitudinal plan of care) . Collaboration with [name], MD regarding development and update of comprehensive plan of care as evidenced by provider attestation and co-signature . Successful outbound call placed to the patients daughter Elijah White to assess for care coordination needs . Discussed the patient was recently seen in the ED due to neuropathy - [name] reports patient pain level was so bad he was crying and hitting himself in the head which is why they went to the ED . Determined Elijah White has been in contact with Dr. Baird Cancer who has increased the patients Gabapentin on Dialysis days to assist with Neuropathy pain . Discussed the patient continues to live in his home but may need placement soon due to continued decline with vision . Encouraged [name] to contact SW when placement is desired for assistance . Performed chart review to note [name] attempted to contact RN Care Manager . Assessed for need to be linked with [place] - Elijah White reports she has since spoken with the patients primary provider Dr. Baird Cancer and does not have any acute needs . Rosamaria Lints of patients next scheduled outreach by [facility] and encouraged her to call sooner if needed . Collaboration with RN Care Manager to provide an update on interventions and plan . Scheduled follow up call over the next 60 days  Patient Goals/Self-Care Activities . Over the next 90 days, patient will:   - Patient will self administer medications as prescribed Patient will attend all scheduled provider appointments Patient will call provider office for new concerns or questions Contact SW as needed prior to next scheduled call Follow Up Plan:  SW will follow up with the patient over the next 60 days       Follow Up Plan: SW will follow up with patient by phone over the next 60 days      [name], BSW, CDP Social Worker, Certified Dementia Practitioner [place] [facility] [place] [facility] [phone]  Total time spent performing care coordination and/or care management activities with the patient by phone or face to face = 20 minutes.

## 2020-06-30 NOTE — Patient Instructions (Signed)
Social Worker Visit Information  Goals we discussed today:  Goals Addressed            This Visit's Progress   . Work with SW to manage care coordination needs       Timeframe:  Long-Range Goal Priority:  Low Start Date: 2.10.22                            Expected End Date: 6.10.22                      Next planned outreach date: 6.28.22  Patient Goals/Self-Care Activities . Over the next 90 days, patient will:   - Patient will self administer medications as prescribed -Patient will attend all scheduled provider appointments -Patient will call provider office for new concerns or questions -Contact SW as needed prior to next scheduled call        Follow Up Plan: SW will follow up with patient by phone over the next 60 days.   [name], BSW, CDP Social Worker, Certified Dementia Practitioner [place] [facility] [place] [facility] [phone]

## 2020-07-02 DIAGNOSIS — N2581 Secondary hyperparathyroidism of renal origin: Secondary | ICD-10-CM | POA: Diagnosis not present

## 2020-07-02 DIAGNOSIS — N186 End stage renal disease: Secondary | ICD-10-CM | POA: Diagnosis not present

## 2020-07-02 DIAGNOSIS — Z992 Dependence on renal dialysis: Secondary | ICD-10-CM | POA: Diagnosis not present

## 2020-07-02 DIAGNOSIS — I129 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 kidney disease, or unspecified chronic kidney disease: Secondary | ICD-10-CM | POA: Diagnosis not present

## 2020-07-05 DIAGNOSIS — Z992 Dependence on renal dialysis: Secondary | ICD-10-CM | POA: Diagnosis not present

## 2020-07-05 DIAGNOSIS — N186 End stage renal disease: Secondary | ICD-10-CM | POA: Diagnosis not present

## 2020-07-05 DIAGNOSIS — N2581 Secondary hyperparathyroidism of renal origin: Secondary | ICD-10-CM | POA: Diagnosis not present

## 2020-07-07 DIAGNOSIS — N186 End stage renal disease: Secondary | ICD-10-CM | POA: Diagnosis not present

## 2020-07-07 DIAGNOSIS — N2581 Secondary hyperparathyroidism of renal origin: Secondary | ICD-10-CM | POA: Diagnosis not present

## 2020-07-07 DIAGNOSIS — Z992 Dependence on renal dialysis: Secondary | ICD-10-CM | POA: Diagnosis not present

## 2020-07-09 DIAGNOSIS — Z992 Dependence on renal dialysis: Secondary | ICD-10-CM | POA: Diagnosis not present

## 2020-07-09 DIAGNOSIS — N2581 Secondary hyperparathyroidism of renal origin: Secondary | ICD-10-CM | POA: Diagnosis not present

## 2020-07-09 DIAGNOSIS — N186 End stage renal disease: Secondary | ICD-10-CM | POA: Diagnosis not present

## 2020-07-12 DIAGNOSIS — Z992 Dependence on renal dialysis: Secondary | ICD-10-CM | POA: Diagnosis not present

## 2020-07-12 DIAGNOSIS — N186 End stage renal disease: Secondary | ICD-10-CM | POA: Diagnosis not present

## 2020-07-12 DIAGNOSIS — N2581 Secondary hyperparathyroidism of renal origin: Secondary | ICD-10-CM | POA: Diagnosis not present

## 2020-07-13 ENCOUNTER — Ambulatory Visit (INDEPENDENT_AMBULATORY_CARE_PROVIDER_SITE_OTHER): Payer: Medicare Other

## 2020-07-13 ENCOUNTER — Other Ambulatory Visit: Payer: Self-pay

## 2020-07-13 ENCOUNTER — Ambulatory Visit (INDEPENDENT_AMBULATORY_CARE_PROVIDER_SITE_OTHER): Payer: Medicare Other | Admitting: Nurse Practitioner

## 2020-07-13 ENCOUNTER — Encounter: Payer: Self-pay | Admitting: Nurse Practitioner

## 2020-07-13 VITALS — BP 118/60 | HR 83 | Temp 98.3°F | Ht 67.0 in | Wt 161.6 lb

## 2020-07-13 DIAGNOSIS — E1122 Type 2 diabetes mellitus with diabetic chronic kidney disease: Secondary | ICD-10-CM | POA: Diagnosis not present

## 2020-07-13 DIAGNOSIS — I130 Hypertensive heart and chronic kidney disease with heart failure and stage 1 through stage 4 chronic kidney disease, or unspecified chronic kidney disease: Secondary | ICD-10-CM | POA: Diagnosis not present

## 2020-07-13 DIAGNOSIS — Z0000 Encounter for general adult medical examination without abnormal findings: Secondary | ICD-10-CM | POA: Diagnosis not present

## 2020-07-13 DIAGNOSIS — E1140 Type 2 diabetes mellitus with diabetic neuropathy, unspecified: Secondary | ICD-10-CM

## 2020-07-13 DIAGNOSIS — N186 End stage renal disease: Secondary | ICD-10-CM

## 2020-07-13 DIAGNOSIS — Z992 Dependence on renal dialysis: Secondary | ICD-10-CM

## 2020-07-13 MED ORDER — OXYCODONE-ACETAMINOPHEN 5-325 MG PO TABS: 1.0000 | ORAL_TABLET | Freq: Four times a day (QID) | ORAL | 0 refills | Status: DC | PRN

## 2020-07-13 NOTE — Patient Instructions (Signed)
Elijah White , Thank you for taking time to come for your Medicare Wellness Visit. I appreciate your ongoing commitment to your health goals. Please review the following plan we discussed and let me know if I can assist you in the future.   Screening recommendations/referrals: Colonoscopy: not required Recommended yearly ophthalmology/optometry visit for glaucoma screening and checkup Recommended yearly dental visit for hygiene and checkup  Vaccinations: Influenza vaccine: completed 12/08/2019, due 10/03/2020 Pneumococcal vaccine: completed 06/27/2019 Tdap vaccine: completed 10/31/2012, due 11/01/2022 Shingles vaccine: discussed   Covid-19:  01/15/2020, 05/14/2019, 04/16/2019  Advanced directives: copy in chart  Conditions/risks identified: none  Next appointment: Follow up in one year for your annual wellness visit.   Preventive Care 14 Years and Older, Male Preventive care refers to lifestyle choices and visits with your health care provider that can promote health and wellness. What does preventive care include?  A yearly physical exam. This is also called an annual well check.  Dental exams once or twice a year.  Routine eye exams. Ask your health care provider how often you should have your eyes checked.  Personal lifestyle choices, including:  Daily care of your teeth and gums.  Regular physical activity.  Eating a healthy diet.  Avoiding tobacco and drug use.  Limiting alcohol use.  Practicing safe sex.  Taking low doses of aspirin every day.  Taking vitamin and mineral supplements as recommended by your health care provider. What happens during an annual well check? The services and screenings done by your health care provider during your annual well check will depend on your age, overall health, lifestyle risk factors, and family history of disease. Counseling  Your health care provider may ask you questions about your:  Alcohol use.  Tobacco use.  Drug  use.  Emotional well-being.  Home and relationship well-being.  Sexual activity.  Eating habits.  History of falls.  Memory and ability to understand (cognition).  Work and work Statistician. Screening  You may have the following tests or measurements:  Height, weight, and BMI.  Blood pressure.  Lipid and cholesterol levels. These may be checked every 5 years, or more frequently if you are over 68 years old.  Skin check.  Lung cancer screening. You may have this screening every year starting at age 59 if you have a 30-pack-year history of smoking and currently smoke or have quit within the past 15 years.  Fecal occult blood test (FOBT) of the stool. You may have this test every year starting at age 72.  Flexible sigmoidoscopy or colonoscopy. You may have a sigmoidoscopy every 5 years or a colonoscopy every 10 years starting at age 64.  Prostate cancer screening. Recommendations will vary depending on your family history and other risks.  Hepatitis C blood test.  Hepatitis B blood test.  Sexually transmitted disease (STD) testing.  Diabetes screening. This is done by checking your blood sugar (glucose) after you have not eaten for a while (fasting). You may have this done every 1-3 years.  Abdominal aortic aneurysm (AAA) screening. You may need this if you are a current or former smoker.  Osteoporosis. You may be screened starting at age 39 if you are at high risk. Talk with your health care provider about your test results, treatment options, and if necessary, the need for more tests. Vaccines  Your health care provider may recommend certain vaccines, such as:  Influenza vaccine. This is recommended every year.  Tetanus, diphtheria, and acellular pertussis (Tdap, Td) vaccine. You may  need a Td booster every 10 years.  Zoster vaccine. You may need this after age 23.  Pneumococcal 13-valent conjugate (PCV13) vaccine. One dose is recommended after age  10.  Pneumococcal polysaccharide (PPSV23) vaccine. One dose is recommended after age 44. Talk to your health care provider about which screenings and vaccines you need and how often you need them. This information is not intended to replace advice given to you by your health care provider. Make sure you discuss any questions you have with your health care provider. Document Released: 03/18/2015 Document Revised: 11/09/2015 Document Reviewed: 12/21/2014 Elsevier Interactive Patient Education  2017 [place] Prevention in the Home Falls can cause injuries. They can happen to people of all ages. There are many things you can do to make your home safe and to help prevent falls. What can I do on the outside of my home?  Regularly fix the edges of walkways and driveways and fix any cracks.  Remove anything that might make you trip as you walk through a door, such as a raised step or threshold.  Trim any bushes or trees on the path to your home.  Use bright outdoor lighting.  Clear any walking paths of anything that might make someone trip, such as rocks or tools.  Regularly check to see if handrails are loose or broken. Make sure that both sides of any steps have handrails.  Any raised decks and porches should have guardrails on the edges.  Have any leaves, snow, or ice cleared regularly.  Use sand or salt on walking paths during winter.  Clean up any spills in your garage right away. This includes oil or grease spills. What can I do in the bathroom?  Use night lights.  Install grab bars by the toilet and in the tub and shower. Do not use towel bars as grab bars.  Use non-skid mats or decals in the tub or shower.  If you need to sit down in the shower, use a plastic, non-slip stool.  Keep the floor dry. Clean up any water that spills on the floor as soon as it happens.  Remove soap buildup in the tub or shower regularly.  Attach bath mats securely with double-sided  non-slip rug tape.  Do not have throw rugs and other things on the floor that can make you trip. What can I do in the bedroom?  Use night lights.  Make sure that you have a light by your bed that is easy to reach.  Do not use any sheets or blankets that are too big for your bed. They should not hang down onto the floor.  Have a firm chair that has side arms. You can use this for support while you get dressed.  Do not have throw rugs and other things on the floor that can make you trip. What can I do in the kitchen?  Clean up any spills right away.  Avoid walking on wet floors.  Keep items that you use a lot in easy-to-reach places.  If you need to reach something above you, use a strong step stool that has a grab bar.  Keep electrical cords out of the way.  Do not use floor polish or wax that makes floors slippery. If you must use wax, use non-skid floor wax.  Do not have throw rugs and other things on the floor that can make you trip. What can I do with my stairs?  Do not leave any items on  the stairs.  Make sure that there are handrails on both sides of the stairs and use them. Fix handrails that are broken or loose. Make sure that handrails are as long as the stairways.  Check any carpeting to make sure that it is firmly attached to the stairs. Fix any carpet that is loose or worn.  Avoid having throw rugs at the top or bottom of the stairs. If you do have throw rugs, attach them to the floor with carpet tape.  Make sure that you have a light switch at the top of the stairs and the bottom of the stairs. If you do not have them, ask someone to add them for you. What else can I do to help prevent falls?  Wear shoes that:  Do not have high heels.  Have rubber bottoms.  Are comfortable and fit you well.  Are closed at the toe. Do not wear sandals.  If you use a stepladder:  Make sure that it is fully opened. Do not climb a closed stepladder.  Make sure that both  sides of the stepladder are locked into place.  Ask someone to hold it for you, if possible.  Clearly mark and make sure that you can see:  Any grab bars or handrails.  First and last steps.  Where the edge of each step is.  Use tools that help you move around (mobility aids) if they are needed. These include:  Canes.  Walkers.  Scooters.  Crutches.  Turn on the lights when you go into a dark area. Replace any light bulbs as soon as they burn out.  Set up your furniture so you have a clear path. Avoid moving your furniture around.  If any of your floors are uneven, fix them.  If there are any pets around you, be aware of where they are.  Review your medicines with your doctor. Some medicines can make you feel dizzy. This can increase your chance of falling. Ask your doctor what other things that you can do to help prevent falls. This information is not intended to replace advice given to you by your health care provider. Make sure you discuss any questions you have with your health care provider. Document Released: 12/16/2008 Document Revised: 07/28/2015 Document Reviewed: 03/26/2014 Elsevier Interactive Patient Education  2017 [employer].

## 2020-07-13 NOTE — Patient Instructions (Signed)
Type 2 Diabetes Mellitus, Diagnosis, Adult Type 2 diabetes (type 2 diabetes mellitus) is a long-term disease. It may happen when there is one or both of these problems:  The pancreas does not make enough insulin.  The body does not react in a normal way to insulin that it makes. Insulin lets sugars go into cells in your body. If you have type 2 diabetes, sugars cannot get into your cells. Sugars build up in the blood. This causes high blood sugar. What are the causes? The exact cause of this condition is not known. What increases the risk? The following factors may make you more likely to develop this condition:  Having type 2 diabetes in your family.  Being overweight or very overweight.  Not being active.  Your body not reacting in a normal way to the insulin it makes.  Having higher than normal blood sugar over time.  Having a type of diabetes when you were pregnant.  Having a condition that causes small fluid-filled sacs on your ovaries. What are the signs or symptoms? At first, you may have no symptoms. You will get symptoms slowly. They may include:  More thirst than normal.  More hunger than normal.  Needing to pee more than normal.  Losing weight without trying.  Feeling tired.  Feeling weak.  Seeing things blurry.  Dark patches on your skin. How is this treated? This condition may be treated by a diabetes expert. You may need to:  Follow an eating plan made by a food expert (dietitian).  Get regular exercise.  Find ways to deal with stress.  Check blood sugar as often as told.  Take medicines. Your doctor will set treatment goals for you. Your blood sugar should be at these levels:  Before meals: 80-130 mg/dL (4.4-7.2 mmol/L).  After meals: below 180 mg/dL (10 mmol/L).  Over the last 2-3 months: less than 7%. Follow these instructions at home: Medicines  Take your diabetes medicines or insulin every day.  Take medicines to help you not get  other problems caused by this condition. You may need: ? Aspirin. ? Medicine to lower cholesterol. ? Medicine to control blood pressure. Questions to ask your doctor  Should I meet with a diabetes educator?  What medicines do I need, and when should I take them?  What will I need to treat my condition at home?  When should I check my blood sugar?  Where can I find a support group?  Who can I call if I have questions?  When is my next doctor visit? General instructions  Take over-the-counter and prescription medicines only as told by your doctor.  Keep all follow-up visits as told by your doctor. This is important. Where to find more information  American Diabetes Association (ADA): www.diabetes.org  American Association of Diabetes Care and Education Specialists (ADCES): www.diabeteseducator.org  International Diabetes Federation (IDF): www.idf.org Contact a doctor if:  Your blood sugar is at or above 240 mg/dL (13.3 mmol/L) for 2 days in a row.  You have been sick for 2 days or more, and you are not getting better.  You have had a fever for 2 days or more, and you are not getting better.  You have any of these problems for more than 6 hours: ? You cannot eat or drink. ? You feel like you may vomit. ? You vomit. ? You have watery poop (diarrhea). Get help right away if:  Your blood sugar is very low. This means it is lower   than 54 mg/dL (3 mmol/L).  You feel mixed up (confused).  You have trouble thinking clearly.  You have trouble breathing.  You have medium or large ketone levels in your pee. These symptoms may be an emergency. Do not wait to see if the symptoms will go away. Get medical help right away. Call your local emergency services (911 in the [place] Do not drive yourself to the hospital. Summary  Type 2 diabetes is a long-term disease. Your pancreas may not make enough insulin, or your body may not react in a normal way to insulin that it  makes.  This condition is treated with an eating plan, lifestyle changes, and medicines.  Your doctor will set treatment goals for you. These will help you keep your blood sugar in a healthy range.  Keep all follow-up visits as told by your doctor. This is important. This information is not intended to replace advice given to you by your health care provider. Make sure you discuss any questions you have with your health care provider. Document Revised: 09/16/2019 Document Reviewed: 09/16/2019 Elsevier Patient Education  2021 Elsevier Inc.  

## 2020-07-13 NOTE — Progress Notes (Signed)
[employer] as a Education administrator for [employer], NP.,have documented all relevant documentation on the behalf of [employer], NP,as directed by  Bary Castilla, NP while in the presence of Bary Castilla, NP.  This visit occurred during the SARS-CoV-2 public health emergency.  Safety protocols were in place, including screening questions prior to the visit, additional usage of staff PPE, and extensive cleaning of exam room while observing appropriate contact time as indicated for disinfecting solutions.  Subjective:     Patient ID: Elijah White , male    DOB: 1940/08/14 , 80 y.o.   MRN: [id number]   Chief Complaint  Patient presents with  . Diabetes  . Hypertension    HPI  He presents today for diabetes check. He would like to discuss his knee pain today. 3 days on dialysis T, Th and Saturday.   He gets his dialysis at [facility]. [place] [facility]. (Dr. [name]). He was recently in the emergency room for extreme neuropathy pain. He has a vascular appt this Friday as well. He had been taking oxycodone for the pain which was given to him in the hospital and states that it helps. Patient's daughter is also with the patient today and expresses concern over his pain.  Patient does not want to get any lab work done today as he gets it routinely at the kidney center every Tuesday.   Diabetes He presents for his follow-up diabetic visit. He has type 2 diabetes mellitus. There are no hypoglycemic associated symptoms. Pertinent negatives for diabetes include no chest pain. There are no hypoglycemic complications. Risk factors for coronary artery disease include diabetes mellitus, dyslipidemia, male sex, hypertension and sedentary lifestyle.  Hypertension This is a chronic problem. The current episode started more than 1 year ago. The problem has been gradually improving since onset. Pertinent negatives include no chest pain, palpitations or shortness of breath.      Past Medical History:  Diagnosis Date  . Anemia    low iron  . Arthritis   . Cancer [facility]) ?2006   prostate  . Colon polyps ~ 1993 and 2003   Dr [name], [facility].  08/2001 colonoscopy: tubular adenoma at cecum.    . 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 Elevated cholesterol with high triglycerides   . ESRD (end stage renal disease) ([place])    [facility]  . GERD (gastroesophageal reflux disease)   . Hypertension   . Legally blind in left eye, as defined in [place]    has pinpoint vision in right eye  . RBBB      Family History  Problem Relation Age of Onset  . CAD Mother      Current Outpatient Medications:  .  apraclonidine (IOPIDINE) 0.5 % ophthalmic solution, Place 1 drop into the right eye 3 (three) times daily. , Disp: , Rfl:  .  atorvastatin (LIPITOR) 10 MG tablet, TAKE 1 TABLET BY MOUTH EVERY DAY, Disp: 90 tablet, Rfl: 0 .  B Complex-C-Folic Acid (DIALYVITE 030) 0.8 MG TABS, Take 0.8 mg by mouth daily. , Disp: , Rfl:  .  blood glucose meter kit and supplies KIT, Dispense based on patient and insurance preference. Use up to four times daily as directed. (FOR ICD-9 250.00, 250.01). For QAC - HS accuchecks., Disp: 1 each, Rfl: 1 .  carvedilol (COREG) 3.125 MG tablet, TAKE 1 TABLET(3.125 MG) BY MOUTH TWICE DAILY WITH A MEAL, Disp: 180 tablet, Rfl:  1 .  clopidogrel (PLAVIX) 75 MG tablet, Take 1 tablet (75 mg total) by mouth daily with breakfast., Disp: 60 tablet, Rfl: 0 .  dorzolamide-timolol (COSOPT) 22.3-6.8 MG/ML ophthalmic solution, Place 1 drop into the right eye 2 (two) times daily., Disp: , Rfl: 11 .  gabapentin (NEURONTIN) 100 MG capsule, Take 1 capsule (100 mg total) by mouth at bedtime., Disp: 90 capsule, Rfl: 1 .  hydroxypropyl methylcellulose / hypromellose (ISOPTO TEARS / GONIOVISC) 2.5 % ophthalmic solution, Place 1 drop into the left eye as needed for dry eyes., Disp: , Rfl:  .  isosorbide mononitrate (IMDUR) 30 MG 24  hr tablet, TAKE 1 TABLET(30 MG) BY MOUTH DAILY, Disp: 90 tablet, Rfl: 1 .  Lancets (ONETOUCH DELICA PLUS [id number]) MISC, , Disp: , Rfl:  .  latanoprost (XALATAN) 0.005 % ophthalmic solution, Place 1 drop into the right eye at bedtime., Disp: , Rfl:  .  ONETOUCH VERIO test strip, USE AS DIRECTED TO TEST FOUR TIMES A DAY, Disp: 100 each, Rfl: 3 .  oxyCODONE-acetaminophen (PERCOCET) 5-325 MG tablet, Take 1-2 tablets by mouth every 6 (six) hours as needed., Disp: 10 tablet, Rfl: 0 .  pantoprazole (PROTONIX) 40 MG tablet, Take 1 tablet (40 mg total) by mouth daily at 12 noon., Disp: 90 tablet, Rfl: 1 .  pilocarpine (PILOCAR) 4 % ophthalmic solution, Place 1 drop into the right eye 4 (four) times daily. , Disp: , Rfl:    No Known Allergies   Review of Systems  Constitutional: Negative.  Negative for chills and fever.  HENT: Negative.   Eyes: Negative.  Negative for pain.  Respiratory: Negative.  Negative for shortness of breath and wheezing.   Cardiovascular: Negative.  Negative for chest pain and palpitations.  Gastrointestinal: Negative.  Negative for abdominal distention and constipation.  Endocrine: Negative.   Genitourinary: Negative.   Musculoskeletal: Positive for arthralgias.       Neuropathy pain   Skin: Negative.   Allergic/Immunologic: Negative.   Neurological: Positive for numbness.       Numbness and tingling to his lower extremities   Hematological: Negative.   Psychiatric/Behavioral: Negative.      There were no vitals filed for this visit. There is no height or weight on file to calculate BMI.   Objective:  Physical Exam Vitals reviewed.  Constitutional:      Appearance: Normal appearance. He is obese.  HENT:     Head: Normocephalic and atraumatic.  Cardiovascular:     Rate and Rhythm: Normal rate and regular rhythm.     Pulses: Normal pulses.     Heart sounds: Normal heart sounds.  Pulmonary:     Effort: Pulmonary effort is normal. No respiratory distress.      Breath sounds: Normal breath sounds. No wheezing.  Musculoskeletal:     Right lower leg: No edema.     Left lower leg: No edema.  Skin:    General: Skin is warm and dry.     Capillary Refill: Capillary refill takes less than 2 seconds.  Neurological:     General: No focal deficit present.     Mental Status: He is alert and oriented to person, place, and time.  Psychiatric:        Mood and Affect: Mood normal.        Behavior: Behavior normal.         Assessment And Plan:     1. Type 2 diabetes mellitus with chronic kidney disease on chronic dialysis, without long-term  current use of insulin (HCC) -Chronic, stable  -Conducted a full diabetic exam today  -Discussed with patient the importance of glycemic control and long term complications from uncontrolled diabetes. Discussed with the patient the importance of compliance with home glucose monitoring, diet which includes decrease amount of sugary drinks and foods. Importance of exercise was also discussed with the patient. Importance of eye exams, self foot care and compliance to office visits was also discussed with the patient.  -Continue dialysis Tues, Thurs, Sat   2. 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 -Limit the intake of processed foods and salt intake. You should increase your intake of green vegetables and fruits. Limit the use of alcohol. Limit fast foods and fried foods. Avoid high fatty saturated and trans fat foods. Keep yourself hydrated with drinking water. Avoid red meats. Eat lean meats instead. Exercise for atleast 30-45 min for atleast 4-5 times a week.   3. ESRD (end stage renal disease) ([place]) -Patient continue to go to dialysis T,Th, and sat.  -Patient gets labs at kidney center every Tuesday   4. 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  -Given oxycodone in the ED. Will refill today and  advised the patient to call back if pain gets worse.  -Continue taking gabapentin 200 mg on days he has dialysis.  -Patient has appt wit vascular on Friday  - oxyCODONE-acetaminophen (PERCOCET) 5-325 MG tablet; Take 1-2 tablets by mouth every 6 (six) hours as needed.  Dispense: 10 tablet; Refill: 0   Follow up: if symptoms get worse   Patient was given opportunity to ask questions. Patient verbalized understanding of the plan and was able to repeat key elements of the plan. All questions were answered to their satisfaction.  Raman , DNP   I, Debbe Mounts, DNP have reviewed all documentation for this visit. The documentation on 07/13/20 for the exam, diagnosis, procedures, and orders are all accurate and complete.    IF YOU HAVE BEEN REFERRED TO A SPECIALIST, IT MAY TAKE 1-2 WEEKS TO SCHEDULE/PROCESS THE REFERRAL. IF YOU HAVE NOT HEARD FROM US/SPECIALIST IN TWO WEEKS, PLEASE GIVE [place] A CALL AT [phone].   THE PATIENT IS ENCOURAGED TO PRACTICE SOCIAL DISTANCING DUE TO THE COVID-19 PANDEMIC.

## 2020-07-13 NOTE — Progress Notes (Signed)
This visit occurred during the SARS-CoV-2 public health emergency.  Safety protocols were in place, including screening questions prior to the visit, additional usage of staff PPE, and extensive cleaning of exam room while observing appropriate contact time as indicated for disinfecting solutions.  Subjective:   Elijah White is a 80 y.o. male who presents for Medicare Annual/Subsequent preventive examination.  Review of Systems     Cardiac Risk Factors include: advanced age (>63mn, >>59women);diabetes mellitus;male gender;hypertension;sedentary lifestyle     Objective:    Today's Vitals   07/13/20 0837 07/13/20 0847  BP: 118/60   Pulse: 83   Temp: 98.3 F (36.8 C)   TempSrc: Oral   Weight: 161 lb 9.6 oz (73.3 kg)   Height: 5' 7" (1.702 m)   PainSc:  7    Body mass index is 25.31 kg/m.  Advanced Directives 07/13/2020 06/25/2020 06/24/2020 01/14/2020 11/26/2019 11/20/2019 03/03/2019  Does Patient Have a Medical Advance Directive? Yes No No Yes Yes Yes Yes  Type of Advance Directive Out of facility DNR (pink MOST or yellow form) - - Living will Healthcare Power [place]  Does patient want to make changes to medical advance directive? - - - No - Patient declined No - Patient declined No - Patient declined -  Copy of [place]? - - - No - copy requested No - copy requested No - copy requested -  Would patient like information on creating a medical advance directive? - - No - Patient declined - - - -    Current Medications (verified) Outpatient Encounter Medications as of 07/13/2020  Medication Sig  . apraclonidine (IOPIDINE) 0.5 % ophthalmic solution Place 1 drop into the right eye 3 (three) times daily.   .Marland Kitchenatorvastatin (LIPITOR) 10 MG tablet TAKE 1 TABLET BY MOUTH EVERY DAY  . B Complex-C-Folic Acid (DIALYVITE 8696 0.8 MG TABS Take 0.8 mg by mouth daily.   . blood glucose meter kit and supplies KIT Dispense  based on patient and insurance preference. Use up to four times daily as directed. (FOR ICD-9 250.00, 250.01). For QAC - HS accuchecks.  . carvedilol (COREG) 3.125 MG tablet TAKE 1 TABLET(3.125 MG) BY MOUTH TWICE DAILY WITH A MEAL  . clopidogrel (PLAVIX) 75 MG tablet Take 1 tablet (75 mg total) by mouth daily with breakfast.  . dorzolamide-timolol (COSOPT) 22.3-6.8 MG/ML ophthalmic solution Place 1 drop into the right eye 2 (two) times daily.  .Marland Kitchengabapentin (NEURONTIN) 100 MG capsule Take 1 capsule (100 mg total) by mouth at bedtime.  . hydroxypropyl methylcellulose / hypromellose (ISOPTO TEARS / GONIOVISC) 2.5 % ophthalmic solution Place 1 drop into the left eye as needed for dry eyes.  . isosorbide mononitrate (IMDUR) 30 MG 24 hr tablet TAKE 1 TABLET(30 MG) BY MOUTH DAILY  . Lancets (ONETOUCH DELICA PLUS [id number] MISC   . latanoprost (XALATAN) 0.005 % ophthalmic solution Place 1 drop into the right eye at bedtime.  . pantoprazole (PROTONIX) 40 MG tablet Take 1 tablet (40 mg total) by mouth daily at 12 noon.  . pilocarpine (PILOCAR) 4 % ophthalmic solution Place 1 drop into the right eye 4 (four) times daily.   .Glory RosebushVERIO test strip USE AS DIRECTED TO TEST FOUR TIMES A DAY  . oxyCODONE-acetaminophen (PERCOCET) 5-325 MG tablet Take 1-2 tablets by mouth every 6 (six) hours as needed. (Patient not taking: Reported on 07/13/2020)   No facility-administered encounter medications on file as  of 07/13/2020.    Allergies (verified) Patient has no known allergies.   History: Past Medical History:  Diagnosis Date  . Anemia    low iron  . Arthritis   . Cancer [facility]) ?2006   prostate  . Colon polyps ~ 1993 and 2003   Dr [name], [facility].  08/2001 colonoscopy: tubular adenoma at cecum.    . 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 Elevated cholesterol with high triglycerides   . ESRD (end stage renal disease) ([place])    TTHSAT - Industrial  . GERD  (gastroesophageal reflux disease)   . Hypertension   . Legally blind in left eye, as defined in [place]    has pinpoint vision in right eye  . RBBB    Past Surgical History:  Procedure Laterality Date  . AV FISTULA PLACEMENT Left 03/20/2018   Procedure: ARTERIOVENOUS (AV) FISTULA CREATION ARM;  Surgeon: [name], MD;  Location: [place];  Service: Vascular;  Laterality: Left;  . BASCILIC VEIN TRANSPOSITION Left 05/21/2018   Procedure: LEFT BASILIC VEIN FISTULA SECOND STAGE;  Surgeon: [name], MD;  Location: [place];  Service: Vascular;  Laterality: Left;  . COLONOSCOPY    . COLONOSCOPY WITH PROPOFOL N/A 11/30/2019   Procedure: COLONOSCOPY WITH PROPOFOL;  Surgeon: Ronnette Juniper, MD;  Location: [place];  Service: Gastroenterology;  Laterality: N/A;  . GLAUCOMA SURGERY  2019   multiple surgeries  . HEMOSTASIS CLIP PLACEMENT  11/30/2019   Procedure: HEMOSTASIS CLIP PLACEMENT;  Surgeon: Ronnette Juniper, MD;  Location: [place];  Service: Gastroenterology;;  . KNEE ARTHROSCOPY    . MULTIPLE EXTRACTIONS WITH ALVEOLOPLASTY N/A 12/30/2019   Procedure: MULTIPLE EXTRACTION WITH ALVEOLOPLASTY;  Surgeon: Charlaine Dalton, DMD;  Location: [place];  Service: Dentistry;  Laterality: N/A;  . POLYPECTOMY  11/30/2019   Procedure: POLYPECTOMY;  Surgeon: Ronnette Juniper, MD;  Location: [place];  Service: Gastroenterology;;  . PROSTATE SURGERY    . RIGHT/LEFT HEART CATH AND CORONARY ANGIOGRAPHY N/A 12/03/2019   Procedure: RIGHT/LEFT HEART CATH AND CORONARY ANGIOGRAPHY;  Surgeon: Burnell Blanks, MD;  Location: [place] CV LAB;  Service: Cardiovascular;  Laterality: N/A;  . SUBMUCOSAL TATTOO INJECTION  11/30/2019   Procedure: SUBMUCOSAL TATTOO INJECTION;  Surgeon: Ronnette Juniper, MD;  Location: [facility] ENDOSCOPY;  Service: Gastroenterology;;  . TEE WITHOUT CARDIOVERSION N/A 01/19/2020   Procedure: TRANSESOPHAGEAL ECHOCARDIOGRAM (TEE);  Surgeon: Burnell Blanks, MD;  Location:  [place] CV LAB;  Service: Open Heart Surgery;  Laterality: N/A;  . TRANSCATHETER AORTIC VALVE REPLACEMENT, TRANSFEMORAL Left 01/19/2020   Procedure: TRANSCATHETER AORTIC VALVE REPLACEMENT, LEFT TRANSFEMORAL;  Surgeon: Burnell Blanks, MD;  Location: [place] CV LAB;  Service: Open Heart Surgery;  Laterality: Left;   Family History  Problem Relation Age of Onset  . CAD Mother    Social History   Socioeconomic History  . Marital status: Widowed    Spouse name: Not on file  . Number of children: Not on file  . Years of education: Not on file  . Highest education level: Not on file  Occupational History  . Occupation: retired  Tobacco Use  . Smoking status: Former Smoker    Packs/day: 0.50    Quit date: 03/23/2018    Years since quitting: 2.3  . Smokeless tobacco: Never Used  Vaping Use  . Vaping Use: Never used  Substance and Sexual Activity  . Alcohol use: No  . Drug use: No  .  Sexual activity: Not Currently  Other Topics Concern  . Not on file  Social History Narrative  . Not on file   Social Determinants of Health   Financial Resource Strain: Low Risk   . Difficulty of Paying Living Expenses: Not hard at all  Food Insecurity: No Food Insecurity  . Worried About Charity fundraiser in the Last Year: Never true  . Ran Out of Food in the Last Year: Never true  Transportation Needs: No Transportation Needs  . Lack of Transportation (Medical): No  . Lack of Transportation (Non-Medical): No  Physical Activity: Inactive  . Days of Exercise per Week: 0 days  . Minutes of Exercise per Session: 0 min  Stress: No Stress Concern Present  . Feeling of Stress : Not at all  Social Connections: Not on file    Tobacco Counseling Counseling given: Not Answered   Clinical Intake:  Pre-visit preparation completed: Yes  Pain : 0-10 Pain Score: 7  Pain Type: Chronic pain Pain Location: Knee Pain Orientation: Left Pain Descriptors / Indicators:  Shooting,Aching Pain Onset: More than a month ago Pain Frequency: Intermittent     Nutritional Status: BMI 25 -29 Overweight Nutritional Risks: None Diabetes: Yes  How often do you need to have someone help you when you read instructions, pamphlets, or other written materials from your doctor or pharmacy?: 1 - Never What is the last grade level you completed in school?: 12th grade  Diabetic? Yes Nutrition Risk Assessment:  Has the patient had any N/V/D within the last 2 months?  No  Does the patient have any non-healing wounds?  No  Has the patient had any unintentional weight loss or weight gain?  Yes   Diabetes:  Is the patient diabetic?  Yes  If diabetic, was a CBG obtained today?  No  Did the patient bring in their glucometer from home?  No  How often do you monitor your CBG's? Does not.   Financial Strains and Diabetes Management:  Are you having any financial strains with the device, your supplies or your medication? No .  Does the patient want to be seen by Chronic Care Management for management of their diabetes?  No  Would the patient like to be referred to a Nutritionist or for Diabetic Management?  No   Diabetic Exams:  Diabetic Eye Exam: Overdue for diabetic eye exam. Pt has been advised about the importance in completing this exam. Patient advised to call and schedule an eye exam. Diabetic Foot Exam: Overdue, Pt has been advised about the importance in completing this exam. Pt is scheduled for diabetic foot exam on next appointment.   Interpreter Needed?: No  Information entered by :: NAllen LPN   Activities of Daily Living In your present state of health, do you have any difficulty performing the following activities: 07/13/2020 01/14/2020  Hearing? N N  Vision? Y Y  Difficulty concentrating or making decisions? N N  Walking or climbing stairs? Y Y  Dressing or bathing? N N  Doing errands, shopping? N N  Preparing Food and eating ? Y -  Using the  Toilet? N -  In the past six months, have you accidently leaked urine? N -  Do you have problems with loss of bowel control? N -  Managing your Medications? Y -  Managing your Finances? N -  Housekeeping or managing your Housekeeping? N -  Some recent data might be hidden    Patient Care Team: [name], MD as  PCP - General (Internal Medicine) O'Neal, Cassie Freer, MD as PCP - Cardiology (Cardiology) Rex Kras, Claudette Stapler, RN as Case Manager [name] as Social Worker Pearson, Sharyn Blitz, [facility] (Pharmacist)  Indicate any recent Medical Services you may have received from other than [facility] providers in the past year (date may be approximate).     Assessment:   This is a routine wellness examination for [facility].  Hearing/Vision screen No exam data present  Dietary issues and exercise activities discussed: Current Exercise Habits: The patient does not participate in regular exercise at present  Goals Addressed            This Visit's Progress   . Patient Stated       07/13/2020, do the best he can      Depression Screen PHQ 2/9 Scores 07/13/2020 03/03/2019 10/28/2018 02/19/2018 10/22/2017  PHQ - 2 Score 2 0 0 0 0  PHQ- 9 Score 2 0 - 0 -    Fall Risk Fall Risk  07/13/2020 03/03/2019 10/28/2018 02/19/2018  Falls in the past year? 0 0 0 0  Risk for fall due to : Impaired vision;Impaired mobility;Medication side effect Impaired balance/gait;Impaired vision;Medication side effect - Impaired balance/gait;Impaired mobility  Risk for fall due to: Comment - - - uses a walker  Follow up Falls evaluation completed;Education provided;Falls prevention discussed Falls evaluation completed;Education provided;Falls prevention discussed - -    FALL RISK PREVENTION PERTAINING TO THE HOME:  Any stairs in or around the home? No  If so, are there any without handrails? n/a Home free of loose throw rugs in walkways, pet beds, electrical cords, etc? Yes  Adequate lighting in your home to  reduce risk of falls? Yes   ASSISTIVE DEVICES UTILIZED TO PREVENT FALLS:  Life alert? Yes  Use of a cane, walker or w/c? Yes  Grab bars in the bathroom? Yes  Shower chair or bench in shower? No  Elevated toilet seat or a handicapped toilet? No   TIMED UP AND GO:  Was the test performed? No .  .   Gait slow and steady with assistive device  Cognitive Function:     6CIT Screen 03/03/2019  What Year? 0 points  What month? 0 points  What time? 0 points  Count back from 20 0 points  Months in reverse 4 points  Repeat phrase 0 points  Total Score 4    Immunizations Immunization History  Administered Date(s) Administered  . Influenza Whole 01/04/2000  . Influenza, High Dose Seasonal PF 10/28/2018  . Influenza-Unspecified 12/26/2017  . Moderna SARS-COV2 Booster Vaccination 01/15/2020  . Moderna Sars-Covid-2 Vaccination 04/16/2019, 05/14/2019  . Pneumococcal Conjugate-13 04/30/2019  . Pneumococcal Polysaccharide-23 06/27/2019    TDAP status: Up to date  Flu Vaccine status: Up to date  Pneumococcal vaccine status: Up to date  Covid-19 vaccine status: Completed vaccines  Qualifies for Shingles Vaccine? Yes   Zostavax completed No   Shingrix Completed?: No.    Education has been provided regarding the importance of this vaccine. Patient has been advised to call insurance company to determine out of pocket expense if they have not yet received this vaccine. Advised may also receive vaccine at local pharmacy or Health Dept. Verbalized acceptance and understanding.  Screening Tests Health Maintenance  Topic Date Due  . OPHTHALMOLOGY EXAM  06/17/2012  . URINE MICROALBUMIN  02/20/2019  . FOOT EXAM  03/02/2020  . HEMOGLOBIN A1C  07/13/2020  . COVID-19 Vaccine (4 - Booster for Moderna series) 07/14/2020  . INFLUENZA  VACCINE  10/03/2020  . TETANUS/TDAP  11/01/2022  . PNA vac Low Risk Adult  Completed  . HPV VACCINES  Aged Out    Health Maintenance  Health Maintenance  Due  Topic Date Due  . OPHTHALMOLOGY EXAM  06/17/2012  . URINE MICROALBUMIN  02/20/2019  . FOOT EXAM  03/02/2020  . HEMOGLOBIN A1C  07/13/2020  . COVID-19 Vaccine (4 - Booster for Moderna series) 07/14/2020    Colorectal cancer screening: No longer required.   Lung Cancer Screening: (Low Dose CT Chest recommended if Age 76-80 years, 30 pack-year currently smoking OR have quit w/in 15years.) does not qualify.   Lung Cancer Screening Referral: no  Additional Screening:  Hepatitis C Screening: does not qualify;   Vision Screening: Recommended annual ophthalmology exams for early detection of glaucoma and other disorders of the eye. Is the patient up to date with their annual eye exam?  No  Who is the provider or what is the name of the office in which the patient attends annual eye exams? Dr. Edilia Bo If pt is not established with a provider, would they like to be referred to a provider to establish care? No .   Dental Screening: Recommended annual dental exams for proper oral hygiene  Community Resource Referral / Chronic Care Management: CRR required this visit?  No   CCM required this visit?  No      Plan:     I have personally reviewed and noted the following in the patient's chart:   . Medical and social history . Use of alcohol, tobacco or illicit drugs  . Current medications and supplements including opioid prescriptions. Patient is not currently taking opioid prescriptions. . Functional ability and status . Nutritional status . Physical activity . Advanced directives . List of other physicians . Hospitalizations, surgeries, and ER visits in previous 12 months . Vitals . Screenings to include cognitive, depression, and falls . Referrals and appointments  In addition, I have reviewed and discussed with patient certain preventive protocols, quality metrics, and best practice recommendations. A written personalized care plan for preventive services as well as general  preventive health recommendations were provided to patient.     Kellie Simmering, LPN   [id number]   Nurse Notes: 6 CIT not performed. Patient refused.

## 2020-07-14 DIAGNOSIS — N186 End stage renal disease: Secondary | ICD-10-CM | POA: Diagnosis not present

## 2020-07-14 DIAGNOSIS — Z992 Dependence on renal dialysis: Secondary | ICD-10-CM | POA: Diagnosis not present

## 2020-07-14 DIAGNOSIS — N2581 Secondary hyperparathyroidism of renal origin: Secondary | ICD-10-CM | POA: Diagnosis not present

## 2020-07-15 ENCOUNTER — Telehealth: Payer: Medicare Other

## 2020-07-16 DIAGNOSIS — N2581 Secondary hyperparathyroidism of renal origin: Secondary | ICD-10-CM | POA: Diagnosis not present

## 2020-07-16 DIAGNOSIS — N186 End stage renal disease: Secondary | ICD-10-CM | POA: Diagnosis not present

## 2020-07-16 DIAGNOSIS — Z992 Dependence on renal dialysis: Secondary | ICD-10-CM | POA: Diagnosis not present

## 2020-07-18 DIAGNOSIS — H401133 Primary open-angle glaucoma, bilateral, severe stage: Secondary | ICD-10-CM | POA: Diagnosis not present

## 2020-07-19 DIAGNOSIS — N186 End stage renal disease: Secondary | ICD-10-CM | POA: Diagnosis not present

## 2020-07-19 DIAGNOSIS — Z992 Dependence on renal dialysis: Secondary | ICD-10-CM | POA: Diagnosis not present

## 2020-07-19 DIAGNOSIS — N2581 Secondary hyperparathyroidism of renal origin: Secondary | ICD-10-CM | POA: Diagnosis not present

## 2020-07-21 DIAGNOSIS — N2581 Secondary hyperparathyroidism of renal origin: Secondary | ICD-10-CM | POA: Diagnosis not present

## 2020-07-21 DIAGNOSIS — N186 End stage renal disease: Secondary | ICD-10-CM | POA: Diagnosis not present

## 2020-07-21 DIAGNOSIS — Z992 Dependence on renal dialysis: Secondary | ICD-10-CM | POA: Diagnosis not present

## 2020-07-22 ENCOUNTER — Ambulatory Visit: Payer: Medicare Other | Admitting: Vascular Surgery

## 2020-07-22 ENCOUNTER — Other Ambulatory Visit: Payer: Self-pay

## 2020-07-22 ENCOUNTER — Encounter: Payer: Self-pay | Admitting: Vascular Surgery

## 2020-07-22 ENCOUNTER — Encounter: Payer: Self-pay | Admitting: *Deleted

## 2020-07-22 ENCOUNTER — Other Ambulatory Visit: Payer: Self-pay | Admitting: *Deleted

## 2020-07-22 VITALS — BP 94/60 | HR 81 | Temp 97.7°F | Resp 20 | Ht 67.0 in | Wt 161.0 lb

## 2020-07-22 DIAGNOSIS — N186 End stage renal disease: Secondary | ICD-10-CM

## 2020-07-22 DIAGNOSIS — I739 Peripheral vascular disease, unspecified: Secondary | ICD-10-CM | POA: Diagnosis not present

## 2020-07-22 NOTE — Progress Notes (Signed)
Patient ID: Elijah White, male   DOB: 1940/06/13, 80 y.o.   MRN: [id number]  Reason for Consult: Follow-up   Referred by Dorothyann Peng, MD  Subjective:     HPI:  Elijah White is a 80 y.o. male history of end-stage renal disease currently dialyzes Tuesdays, Thursdays and Saturdays via left arm basilic vein fistula.  He now has pain in his left foot as well as superficial ulceration.  He has not had any wound care.  He does walk with help of a cane.  Patient takes aspirin daily was recently on Plavix but this was stopped.  States that at this time foot is hurting all the time including at rest.  He has never had lower extremity intervention in the past.  Past Medical History:  Diagnosis Date  . Anemia    low iron  . Arthritis   . Cancer [facility]) ?2006   prostate  . Colon polyps ~ 1993 and 2003   Dr Dorena Cookey, [facility].  08/2001 colonoscopy: tubular adenoma at cecum.    . 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 Elevated cholesterol with high triglycerides   . ESRD (end stage renal disease) (HCC)    TTHSAT - Industrial  . GERD (gastroesophageal reflux disease)   . Hypertension   . Legally blind in left eye, as defined in [place]    has pinpoint vision in right eye  . RBBB    Family History  Problem Relation Age of Onset  . CAD Mother    Past Surgical History:  Procedure Laterality Date  . AV FISTULA PLACEMENT Left 03/20/2018   Procedure: ARTERIOVENOUS (AV) FISTULA CREATION ARM;  Surgeon: Maeola Harman, MD;  Location: [facility] OR;  Service: Vascular;  Laterality: Left;  . BASCILIC VEIN TRANSPOSITION Left 05/21/2018   Procedure: LEFT BASILIC VEIN FISTULA SECOND STAGE;  Surgeon: Maeola Harman, MD;  Location: [facility] OR;  Service: Vascular;  Laterality: Left;  . COLONOSCOPY    . COLONOSCOPY WITH PROPOFOL N/A 11/30/2019   Procedure: COLONOSCOPY WITH PROPOFOL;  Surgeon: Kerin Salen, MD;  Location: [facility] ENDOSCOPY;  Service:  Gastroenterology;  Laterality: N/A;  . GLAUCOMA SURGERY  2019   multiple surgeries  . HEMOSTASIS CLIP PLACEMENT  11/30/2019   Procedure: HEMOSTASIS CLIP PLACEMENT;  Surgeon: Kerin Salen, MD;  Location: [facility] ENDOSCOPY;  Service: Gastroenterology;;  . KNEE ARTHROSCOPY    . MULTIPLE EXTRACTIONS WITH ALVEOLOPLASTY N/A 12/30/2019   Procedure: MULTIPLE EXTRACTION WITH ALVEOLOPLASTY;  Surgeon: Sharman Cheek, DMD;  Location: [facility] OR;  Service: Dentistry;  Laterality: N/A;  . POLYPECTOMY  11/30/2019   Procedure: POLYPECTOMY;  Surgeon: Kerin Salen, MD;  Location[facility] ENDOSCOPY;  Service: Gastroenterology;;  . PROSTATE SURGERY    . RIGHT/LEFT HEART CATH AND CORONARY ANGIOGRAPHY N/A 12/03/2019   Procedure: RIGHT/LEFT HEART CATH AND CORONARY ANGIOGRAPHY;  Surgeon: Kathleene Hazel, MD;  Location: [facility] INVASIVE CV LAB;  Service: Cardiovascular;  Laterality: N/A;  . SUBMUCOSAL TATTOO INJECTION  11/30/2019   Procedure: SUBMUCOSAL TATTOO INJECTION;  Surgeon: Kerin Salen, MD;  Location: [facility] ENDOSCOPY;  Service: Gastroenterology;;  . TEE WITHOUT CARDIOVERSION N/A 01/19/2020   Procedure: TRANSESOPHAGEAL ECHOCARDIOGRAM (TEE);  Surgeon: Kathleene Hazel, MD;  Location: [facility] INVASIVE CV LAB;  Service: Open Heart Surgery;  Laterality: N/A;  . TRANSCATHETER AORTIC VALVE REPLACEMENT, TRANSFEMORAL Left 01/19/2020   Procedure: TRANSCATHETER AORTIC VALVE REPLACEMENT, LEFT TRANSFEMORAL;  Surgeon: Kathleene Hazel, MD;  Location: [facility] INVASIVE CV LAB;  Service: Open Heart Surgery;  Laterality: Left;    Short Social History:  Social History   Tobacco Use  . Smoking status: Former Smoker    Packs/day: 0.50    Quit date: 03/23/2018    Years since quitting: 2.3  . Smokeless tobacco: Never Used  Substance Use Topics  . Alcohol use: No    No Known Allergies  Current Outpatient Medications  Medication Sig Dispense Refill  . apraclonidine (IOPIDINE) 0.5 % ophthalmic solution Place 1 drop into the right eye 3  (three) times daily.     [name] atorvastatin (LIPITOR) 10 MG tablet TAKE 1 TABLET BY MOUTH EVERY DAY 90 tablet 0  . B Complex-C-Folic Acid (DIALYVITE 562) 0.8 MG TABS Take 0.8 mg by mouth daily.     . blood glucose meter kit and supplies KIT Dispense based on patient and insurance preference. Use up to four times daily as directed. (FOR ICD-9 250.00, 250.01). For QAC - HS accuchecks. 1 each 1  . carvedilol (COREG) 3.125 MG tablet TAKE 1 TABLET(3.125 MG) BY MOUTH TWICE DAILY WITH A MEAL 180 tablet 1  . clopidogrel (PLAVIX) 75 MG tablet Take 1 tablet (75 mg total) by mouth daily with breakfast. 60 tablet 0  . dorzolamide-timolol (COSOPT) 22.3-6.8 MG/ML ophthalmic solution Place 1 drop into the right eye 2 (two) times daily.  11  . gabapentin (NEURONTIN) 100 MG capsule Take 1 capsule (100 mg total) by mouth at bedtime. 90 capsule 1  . hydroxypropyl methylcellulose / hypromellose (ISOPTO TEARS / GONIOVISC) 2.5 % ophthalmic solution Place 1 drop into the left eye as needed for dry eyes.    . isosorbide mononitrate (IMDUR) 30 MG 24 hr tablet TAKE 1 TABLET(30 MG) BY MOUTH DAILY 90 tablet 1  . Lancets (ONETOUCH DELICA PLUS [id number]) MISC     . latanoprost (XALATAN) 0.005 % ophthalmic solution Place 1 drop into the right eye at bedtime.    [name] test strip USE AS DIRECTED TO TEST FOUR TIMES A DAY 100 each 3  . oxyCODONE-acetaminophen (PERCOCET) 5-325 MG tablet Take 1-2 tablets by mouth every 6 (six) hours as needed. 10 tablet 0  . pantoprazole (PROTONIX) 40 MG tablet Take 1 tablet (40 mg total) by mouth daily at 12 noon. 90 tablet 1  . pilocarpine (PILOCAR) 4 % ophthalmic solution Place 1 drop into the right eye 4 (four) times daily.      No current facility-administered medications for this visit.    Review of Systems  Constitutional:  Constitutional negative. HENT: HENT negative.  Eyes: Eyes negative.  GI: Gastrointestinal negative.  Musculoskeletal: Positive for leg pain.  Skin: Positive  for wound.  Neurological: Neurological negative. Hematologic: Hematologic/lymphatic negative.  Psychiatric: Psychiatric negative.        Objective:  Objective   Vitals:   07/22/20 0910  BP: 94/60  Pulse: 81  Resp: 20  Temp: 97.7 F (36.5 C)  SpO2: 96%  Weight: 161 lb (73 kg)  Height: $Remove'5\' 7"'N[place]uk$  (1.702 m)   Body mass index is 25.22 kg/m.  Physical Exam HENT:     Head: Normocephalic.     Nose:     Comments: Wearing a mask Eyes:     Pupils: Pupils are equal, round, and reactive to light.  Neck:     Vascular: Carotid bruit present.  Cardiovascular:     Pulses:          Femoral pulses are 2+ on the right side and 2+ on the left side. Pulmonary:  Effort: Pulmonary effort is normal.  Abdominal:     General: Abdomen is flat.     Palpations: Abdomen is soft.  Musculoskeletal:     Comments: Left upper extremity fistula with pulsatility  Skin:    Comments: See below  Neurological:     General: No focal deficit present.     Mental Status: He is alert.  Psychiatric:        Mood and Affect: Mood normal.        Behavior: Behavior normal.        Thought Content: Thought content normal.        Judgment: Judgment normal.        Data: CTA angio IMPRESSION: VASCULAR  1. Left popliteal atherosclerotic occlusion extending approximately 8 cm in the craniocaudal dimension with reconstitution distally. 2. Bilateral three-vessel runoff with intermittent occlusion of the leg arterial vasculature due to calcified and noncalcified plaque. 3.  Aortic Atherosclerosis (ICD10-I70.0).  NON-VASCULAR  1. Cholelithiasis with no acute cholecystitis. 2. Bilateral atrophic kidneys. 3. 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 4. Prominent prostate with radiation seeds noted.     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   ABI was not performed likely would not be accurate anyway given end-stage renal disease likely calcified tibial disease.  Plan will be for urgent angiogram we will get him scheduled next week with either myself or one of my partners.  We discussed the possible outcomes as well as the risk and benefits.  Patient recently stopped Plavix will likely need to be initiated if intervention is undertaken.  He and his daughter demonstrate very good understanding we will get him scheduled on a nondialysis day in the very near future.     [name] MD [facility]

## 2020-07-23 DIAGNOSIS — N2581 Secondary hyperparathyroidism of renal origin: Secondary | ICD-10-CM | POA: Diagnosis not present

## 2020-07-23 DIAGNOSIS — N186 End stage renal disease: Secondary | ICD-10-CM | POA: Diagnosis not present

## 2020-07-23 DIAGNOSIS — Z992 Dependence on renal dialysis: Secondary | ICD-10-CM | POA: Diagnosis not present

## 2020-07-25 ENCOUNTER — Other Ambulatory Visit: Payer: Self-pay

## 2020-07-25 ENCOUNTER — Ambulatory Visit (HOSPITAL_COMMUNITY)
Admission: RE | Admit: 2020-07-25 | Discharge: 2020-07-25 | Disposition: A | Discharge status: Home / Self Care | Payer: Medicare Other | Source: Ambulatory Visit | Attending: Vascular Surgery | Admitting: Vascular Surgery

## 2020-07-25 ENCOUNTER — Encounter (HOSPITAL_COMMUNITY)
Admission: RE | Disposition: A | Discharge status: Home / Self Care | Payer: Self-pay | Source: Ambulatory Visit | Attending: Vascular Surgery

## 2020-07-25 DIAGNOSIS — L97529 Non-pressure chronic ulcer of other part of left foot with unspecified severity: Secondary | ICD-10-CM | POA: Insufficient documentation

## 2020-07-25 DIAGNOSIS — I998 Other disorder of circulatory system: Secondary | ICD-10-CM | POA: Diagnosis not present

## 2020-07-25 DIAGNOSIS — I70245 Atherosclerosis of native arteries of left leg with ulceration of other part of foot: Secondary | ICD-10-CM | POA: Diagnosis not present

## 2020-07-25 DIAGNOSIS — Z7902 Long term (current) use of antithrombotics/antiplatelets: Secondary | ICD-10-CM | POA: Insufficient documentation

## 2020-07-25 DIAGNOSIS — Z79899 Other long term (current) drug therapy: Secondary | ICD-10-CM | POA: Diagnosis not present

## 2020-07-25 DIAGNOSIS — Z8249 Family history of ischemic heart disease and other diseases of the circulatory system: Secondary | ICD-10-CM | POA: Insufficient documentation

## 2020-07-25 DIAGNOSIS — Z8546 Personal history of malignant neoplasm of prostate: Secondary | ICD-10-CM | POA: Insufficient documentation

## 2020-07-25 DIAGNOSIS — I120 Hypertensive chronic kidney disease with stage 5 chronic kidney disease or end stage renal disease: Secondary | ICD-10-CM | POA: Diagnosis not present

## 2020-07-25 DIAGNOSIS — Z952 Presence of prosthetic heart valve: Secondary | ICD-10-CM | POA: Insufficient documentation

## 2020-07-25 DIAGNOSIS — Z992 Dependence on renal dialysis: Secondary | ICD-10-CM | POA: Diagnosis not present

## 2020-07-25 DIAGNOSIS — I4510 Unspecified right bundle-branch block: Secondary | ICD-10-CM | POA: Insufficient documentation

## 2020-07-25 DIAGNOSIS — H5440 Blindness, one eye, unspecified eye: Secondary | ICD-10-CM | POA: Insufficient documentation

## 2020-07-25 DIAGNOSIS — E1151 Type 2 diabetes mellitus with diabetic peripheral angiopathy without gangrene: Secondary | ICD-10-CM | POA: Diagnosis not present

## 2020-07-25 DIAGNOSIS — N186 End stage renal disease: Secondary | ICD-10-CM | POA: Diagnosis not present

## 2020-07-25 DIAGNOSIS — Z95828 Presence of other vascular implants and grafts: Secondary | ICD-10-CM | POA: Diagnosis not present

## 2020-07-25 DIAGNOSIS — E1122 Type 2 diabetes mellitus with diabetic chronic kidney disease: Secondary | ICD-10-CM | POA: Insufficient documentation

## 2020-07-25 DIAGNOSIS — E11621 Type 2 diabetes mellitus with foot ulcer: Secondary | ICD-10-CM | POA: Insufficient documentation

## 2020-07-25 DIAGNOSIS — Z86010 Personal history of colonic polyps: Secondary | ICD-10-CM | POA: Insufficient documentation

## 2020-07-25 DIAGNOSIS — Z87891 Personal history of nicotine dependence: Secondary | ICD-10-CM | POA: Insufficient documentation

## 2020-07-25 DIAGNOSIS — E7800 Pure hypercholesterolemia, unspecified: Secondary | ICD-10-CM | POA: Diagnosis not present

## 2020-07-25 HISTORY — PX: ABDOMINAL AORTOGRAM W/LOWER EXTREMITY: CATH118223

## 2020-07-25 HISTORY — PX: PERIPHERAL VASCULAR INTERVENTION: CATH118257

## 2020-07-25 LAB — POCT I-STAT, CHEM 8
BUN: 23 mg/dL (ref 8–23)
Calcium, Ion: 1.17 mmol/L (ref 1.15–1.40)
Chloride: 101 mmol/L (ref 98–111)
Creatinine, Ser: 7.6 mg/dL — ABNORMAL HIGH (ref 0.61–1.24)
Glucose, Bld: 88 mg/dL (ref 70–99)
HCT: 33 % — ABNORMAL LOW (ref 39.0–52.0)
Hemoglobin: 11.2 g/dL — ABNORMAL LOW (ref 13.0–17.0)
Potassium: 4.6 mmol/L (ref 3.5–5.1)
Sodium: 143 mmol/L (ref 135–145)
TCO2: 31 mmol/L (ref 22–32)

## 2020-07-25 LAB — GLUCOSE, CAPILLARY
Glucose-Capillary: 74 mg/dL (ref 70–99)
Glucose-Capillary: 63 mg/dL — ABNORMAL LOW (ref 70–99)

## 2020-07-25 LAB — POCT ACTIVATED CLOTTING TIME
Activated Clotting Time: 243 seconds
Activated Clotting Time: 208 seconds
Activated Clotting Time: 184 seconds

## 2020-07-25 SURGERY — ABDOMINAL AORTOGRAM W/LOWER EXTREMITY
Anesthesia: LOCAL

## 2020-07-25 MED ORDER — ONDANSETRON HCL 4 MG/2ML IJ SOLN: 4.0000 mg | Freq: Four times a day (QID) | INTRAMUSCULAR | Status: DC | PRN

## 2020-07-25 MED ORDER — FENTANYL CITRATE (PF) 100 MCG/2ML IJ SOLN
INTRAMUSCULAR | Status: DC | PRN
  Administered 2020-07-25 (×2): 50 ug via INTRAVENOUS

## 2020-07-25 MED ORDER — HEPARIN (PORCINE) IN NACL 1000-0.9 UT/500ML-% IV SOLN
INTRAVENOUS | Status: DC | PRN
  Administered 2020-07-25 (×2): 500 mL

## 2020-07-25 MED ORDER — SODIUM CHLORIDE 0.9 % IV SOLN: INTRAVENOUS | Status: DC

## 2020-07-25 MED ORDER — MIDAZOLAM HCL 2 MG/2ML IJ SOLN
INTRAMUSCULAR | Status: AC
  Filled 2020-07-25: qty 2

## 2020-07-25 MED ORDER — HEPARIN (PORCINE) IN NACL 1000-0.9 UT/500ML-% IV SOLN
INTRAVENOUS | Status: AC
  Filled 2020-07-25: qty 1000

## 2020-07-25 MED ORDER — MIDAZOLAM HCL 2 MG/2ML IJ SOLN
INTRAMUSCULAR | Status: DC | PRN
  Administered 2020-07-25: 1 mg via INTRAVENOUS

## 2020-07-25 MED ORDER — LIDOCAINE HCL (PF) 1 % IJ SOLN
INTRAMUSCULAR | Status: DC | PRN
  Administered 2020-07-25: 18 mL

## 2020-07-25 MED ORDER — ACETAMINOPHEN 325 MG PO TABS
ORAL_TABLET | ORAL | Status: AC
  Filled 2020-07-25: qty 2

## 2020-07-25 MED ORDER — FENTANYL CITRATE (PF) 100 MCG/2ML IJ SOLN
INTRAMUSCULAR | Status: AC
  Filled 2020-07-25: qty 2

## 2020-07-25 MED ORDER — HEPARIN SODIUM (PORCINE) 1000 UNIT/ML IJ SOLN
INTRAMUSCULAR | Status: AC
  Filled 2020-07-25: qty 1

## 2020-07-25 MED ORDER — CLOPIDOGREL BISULFATE 75 MG PO TABS: 300.0000 mg | ORAL_TABLET | Freq: Once | ORAL | Status: DC

## 2020-07-25 MED ORDER — CLOPIDOGREL BISULFATE 300 MG PO TABS
ORAL_TABLET | ORAL | Status: DC | PRN
  Administered 2020-07-25: 300 mg via ORAL

## 2020-07-25 MED ORDER — CLOPIDOGREL BISULFATE 75 MG PO TABS: 75.0000 mg | ORAL_TABLET | Freq: Every day | ORAL | Status: DC

## 2020-07-25 MED ORDER — IODIXANOL 320 MG/ML IV SOLN
INTRAVENOUS | Status: DC | PRN
  Administered 2020-07-25: 100 mL via INTRA_ARTERIAL

## 2020-07-25 MED ORDER — ACETAMINOPHEN 325 MG PO TABS
650.0000 mg | ORAL_TABLET | ORAL | Status: DC | PRN
  Administered 2020-07-25: 650 mg via ORAL

## 2020-07-25 MED ORDER — SODIUM CHLORIDE 0.9% FLUSH: 3.0000 mL | Freq: Two times a day (BID) | INTRAVENOUS | Status: DC

## 2020-07-25 MED ORDER — CLOPIDOGREL BISULFATE 75 MG PO TABS: 75.0000 mg | ORAL_TABLET | Freq: Every day | ORAL | 5 refills | Status: AC

## 2020-07-25 MED ORDER — SODIUM CHLORIDE 0.9% FLUSH: 3.0000 mL | INTRAVENOUS | Status: DC | PRN

## 2020-07-25 MED ORDER — HYDRALAZINE HCL 20 MG/ML IJ SOLN: 5.0000 mg | INTRAMUSCULAR | Status: DC | PRN

## 2020-07-25 MED ORDER — SODIUM CHLORIDE 0.9 % IV SOLN: 250.0000 mL | INTRAVENOUS | Status: DC | PRN

## 2020-07-25 MED ORDER — HEPARIN SODIUM (PORCINE) 1000 UNIT/ML IJ SOLN
INTRAMUSCULAR | Status: DC | PRN
  Administered 2020-07-25: 8000 [IU] via INTRAVENOUS

## 2020-07-25 MED ORDER — LABETALOL HCL 5 MG/ML IV SOLN: 10.0000 mg | INTRAVENOUS | Status: DC | PRN

## 2020-07-25 MED ORDER — CLOPIDOGREL BISULFATE 300 MG PO TABS
ORAL_TABLET | ORAL | Status: AC
  Filled 2020-07-25: qty 1

## 2020-07-25 MED ORDER — LIDOCAINE HCL (PF) 1 % IJ SOLN
INTRAMUSCULAR | Status: AC
  Filled 2020-07-25: qty 30

## 2020-07-25 SURGICAL SUPPLY — 19 items
BALLN JADE .018 6.0 X 100 (BALLOONS) ×3
BALLN STERLING OTW 4X100X135 (BALLOONS) ×3
BALLOON JADE .018 6.0 X 100 (BALLOONS) ×2 IMPLANT
BALLOON STERLING OTW 4X100X135 (BALLOONS) ×2 IMPLANT
CATH OMNI FLUSH 5F 65CM (CATHETERS) ×3 IMPLANT
CATH QUICKCROSS .035X135CM (MICROCATHETER) ×3 IMPLANT
GLIDEWIRE ADV .035X260CM (WIRE) ×3 IMPLANT
KIT ENCORE 26 ADVANTAGE (KITS) ×3 IMPLANT
KIT MICROPUNCTURE NIT STIFF (SHEATH) ×3 IMPLANT
KIT PV (KITS) ×3 IMPLANT
SHEATH HIGHFLEX ANSEL 6FRX55 (SHEATH) ×3 IMPLANT
SHEATH PINNACLE 5F 10CM (SHEATH) ×3 IMPLANT
SHEATH PROBE COVER 6X72 (BAG) ×3 IMPLANT
STENT VIABAHN 6X100X120 (Permanent Stent) ×3 IMPLANT
SYR MEDRAD MARK V 150ML (SYRINGE) ×3 IMPLANT
TRANSDUCER W/STOPCOCK (MISCELLANEOUS) ×3 IMPLANT
TRAY PV CATH (CUSTOM PROCEDURE TRAY) ×3 IMPLANT
WIRE BENTSON .035X145CM (WIRE) ×3 IMPLANT
WIRE G V18X300CM (WIRE) ×3 IMPLANT

## 2020-07-25 NOTE — Progress Notes (Signed)
Dr Carlis Abbott in cath lab holding to see pt, informed of ACT levels, states it is ok to remove sheath if ACT level is a little above 175, also pt may still be discharged home at 2100 even though bedrest may end past that time if pt remains stable and groin is WNL, safety maintained, Short Stay RN, Threasa Beards informed when called for a room for transfer

## 2020-07-25 NOTE — H&P (Signed)
History and Physical Interval Note:  07/25/2020 11:20 AM  Elijah White  has presented today for surgery, with the diagnosis of PAD.  The various methods of treatment have been discussed with the patient and family. After consideration of risks, benefits and other options for treatment, the patient has consented to  Procedure(s): ABDOMINAL AORTOGRAM W/LOWER EXTREMITY (N/A) as a surgical intervention.  The patient's history has been reviewed, patient examined, no change in status, stable for surgery.  I have reviewed the patient's chart and labs.  Questions were answered to the patient's satisfaction.     Marty Heck  Patient ID: Elijah White, male   DOB: 1940/07/15, 80 y.o.   MRN: [id number]  Reason for Consult: Follow-up   Referred by [name], MD  Subjective:    Subjective _0 Expand by Default   HPI:  Elijah White is a 80 y.o. male history of end-stage renal disease currently dialyzes Tuesdays, Thursdays and Saturdays via left arm basilic vein fistula.  He now has pain in his left foot as well as superficial ulceration.  He has not had any wound care.  He does walk with help of a cane.  Patient takes aspirin daily was recently on Plavix but this was stopped.  States that at this time foot is hurting all the time including at rest.  He has never had lower extremity intervention in the past.      Past Medical History:  Diagnosis Date  . Anemia    low iron  . Arthritis   . Cancer [facility]) ?2006   prostate  . Colon polyps ~ 1993 and 2003   Dr [name], [facility].  08/2001 colonoscopy: tubular adenoma at cecum.    . 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 Elevated cholesterol with high triglycerides   . ESRD (end stage renal disease) ([place])    [facility]  . GERD (gastroesophageal reflux disease)   . Hypertension   . Legally blind in left eye, as defined in [place]    has pinpoint vision in right eye  .  RBBB         Family History  Problem Relation Age of Onset  . CAD Mother         Past Surgical History:  Procedure Laterality Date  . AV FISTULA PLACEMENT Left 03/20/2018   Procedure: ARTERIOVENOUS (AV) FISTULA CREATION ARM;  Surgeon: [name], MD;  Location: [place];  Service: Vascular;  Laterality: Left;  . BASCILIC VEIN TRANSPOSITION Left 05/21/2018   Procedure: LEFT BASILIC VEIN FISTULA SECOND STAGE;  Surgeon: [name], MD;  Location: [place];  Service: Vascular;  Laterality: Left;  . COLONOSCOPY    . COLONOSCOPY WITH PROPOFOL N/A 11/30/2019   Procedure: COLONOSCOPY WITH PROPOFOL;  Surgeon: Ronnette Juniper, MD;  Location: [place];  Service: Gastroenterology;  Laterality: N/A;  . GLAUCOMA SURGERY  2019   multiple surgeries  . HEMOSTASIS CLIP PLACEMENT  11/30/2019   Procedure: HEMOSTASIS CLIP PLACEMENT;  Surgeon: Ronnette Juniper, MD;  Location: [place];  Service: Gastroenterology;;  . KNEE ARTHROSCOPY    . MULTIPLE EXTRACTIONS WITH ALVEOLOPLASTY N/A 12/30/2019   Procedure: MULTIPLE EXTRACTION WITH ALVEOLOPLASTY;  Surgeon: Charlaine Dalton, DMD;  Location: [place];  Service: Dentistry;  Laterality: N/A;  . POLYPECTOMY  11/30/2019   Procedure: POLYPECTOMY;  Surgeon: Ronnette Juniper, MD;  Location: [place];  Service: Gastroenterology;;  . PROSTATE SURGERY    .  RIGHT/LEFT HEART CATH AND CORONARY ANGIOGRAPHY N/A 12/03/2019   Procedure: RIGHT/LEFT HEART CATH AND CORONARY ANGIOGRAPHY;  Surgeon: Burnell Blanks, MD;  Location: [place] [facility];  Service: Cardiovascular;  Laterality: N/A;  . SUBMUCOSAL TATTOO INJECTION  11/30/2019   Procedure: SUBMUCOSAL TATTOO INJECTION;  Surgeon: Ronnette Juniper, MD;  Location: [facility] END[facility]COPY;  Service: Gastroenterology;;  . TEE WITHOUT CARDIOVERSION N/A 01/19/2020   Procedure: TRANSESOPHAGEAL ECHOCARDIOGRAM (TEE);  Surgeon: Burnell Blanks, MD;  Location: [place] [facility];  Service: Open Heart  Surgery;  Laterality: N/A;  . TRANSCATHETER AORTIC VALVE REPLACEMENT, TRANSFEMORAL Left 01/19/2020   Procedure: TRANSCATHETER AORTIC VALVE REPLACEMENT, LEFT TRANSFEMORAL;  Surgeon: Burnell Blanks, MD;  Location: [place] [facility];  Service: Open Heart Surgery;  Laterality: Left;    Short Social History:  Social History        Tobacco Use  . Smoking status: Former Smoker    Packs/day: 0.50    Quit date: 03/23/2018    Years since quitting: 2.3  . Smokeless tobacco: Never Used  Substance Use Topics  . Alcohol use: No    No Known Allergies        Current Outpatient Medications  Medication Sig Dispense Refill  . apraclonidine (IOPIDINE) 0.5 % ophthalmic solution Place 1 drop into the right eye 3 (three) times daily.     [name] atorvastatin (LIPITOR) 10 MG tablet TAKE 1 TABLET BY MOUTH EVERY DAY 90 tablet 0  . B Complex-C-Folic Acid (DIALYVITE 176) 0.8 MG TABS Take 0.8 mg by mouth daily.     . blood glucose meter kit and supplies KIT Dispense based on patient and insurance preference. Use up to four times daily as directed. (FOR ICD-9 250.00, 250.01). For QAC - HS accuchecks. 1 each 1  . carvedilol (COREG) 3.125 MG tablet TAKE 1 TABLET(3.125 MG) BY MOUTH TWICE DAILY WITH A MEAL 180 tablet 1  . clopidogrel (PLAVIX) 75 MG tablet Take 1 tablet (75 mg total) by mouth daily with breakfast. 60 tablet 0  . dorzolamide-timolol (COSOPT) 22.3-6.8 MG/ML ophthalmic solution Place 1 drop into the right eye 2 (two) times daily.  11  . gabapentin (NEURONTIN) 100 MG capsule Take 1 capsule (100 mg total) by mouth at bedtime. 90 capsule 1  . hydroxypropyl methylcellulose / hypromellose (ISOPTO TEARS / GONIOVISC) 2.5 % ophthalmic solution Place 1 drop into the left eye as needed for dry eyes.    . isosorbide mononitrate (IMDUR) 30 MG 24 hr tablet TAKE 1 TABLET(30 MG) BY MOUTH DAILY 90 tablet 1  . Lancets (ONETOUCH DELICA PLUS [id number]) MISC     . latanoprost (XALATAN) 0.005 %  ophthalmic solution Place 1 drop into the right eye at bedtime.    [name] test strip USE AS DIRECTED TO TEST FOUR TIMES A DAY 100 each 3  . oxyCODONE-acetaminophen (PERCOCET) 5-325 MG tablet Take 1-2 tablets by mouth every 6 (six) hours as needed. 10 tablet 0  . pantoprazole (PROTONIX) 40 MG tablet Take 1 tablet (40 mg total) by mouth daily at 12 noon. 90 tablet 1  . pilocarpine (PILOCAR) 4 % ophthalmic solution Place 1 drop into the right eye 4 (four) times daily.      No current facility-administered medications for this visit.    Review of Systems  Constitutional:  Constitutional negative. HENT: HENT negative.  Eyes: Eyes negative.  GI: Gastrointestinal negative.  Musculoskeletal: Positive for leg pain.  Skin: Positive for wound.  Neurological: Neurological negative. Hematologic: Hematologic/lymphatic negative.  Psychiatric:  Psychiatric negative.        Objective:   Objective _0 Expand by Default       Vitals:   07/22/20 0910  BP: 94/60  Pulse: 81  Resp: 20  Temp: 97.7 F (36.5 C)  SpO2: 96%  Weight: 161 lb (73 kg)  Height: _1  (1.702 m)   Body mass index is 25.22 kg/m.  Physical Exam HENT:     Head: Normocephalic.     Nose:     Comments: Wearing a mask Eyes:     Pupils: Pupils are equal, round, and reactive to light.  Neck:     Vascular: Carotid bruit present.  Cardiovascular:     Pulses:          Femoral pulses are 2+ on the right side and 2+ on the left side. Pulmonary:     Effort: Pulmonary effort is normal.  Abdominal:     General: Abdomen is flat.     Palpations: Abdomen is soft.  Musculoskeletal:     Comments: Left upper extremity fistula with pulsatility  Skin:    Comments: See below  Neurological:     General: No focal deficit present.     Mental Status: He is alert.  Psychiatric:        Mood and Affect: Mood normal.        Behavior: Behavior normal.        Thought Content: Thought content normal.         Judgment: Judgment normal.        Data: CTA angio IMPRESSION: VASCULAR  1. Left popliteal atherosclerotic occlusion extending approximately 8 cm in the craniocaudal dimension with reconstitution distally. 2. Bilateral three-vessel runoff with intermittent occlusion of the leg arterial vasculature due to calcified and noncalcified plaque. 3. Aortic Atherosclerosis (ICD10-I70.0).  NON-VASCULAR  1. Cholelithiasis with no acute cholecystitis. 2. Bilateral atrophic kidneys. 3. 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 4. Prominent prostate with radiation seeds noted.     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  ABI was not performed likely would not be accurate anyway given end-stage renal disease likely calcified tibial disease.  Plan will be for urgent angiogram we will get him scheduled next week with either myself or one of my partners.  We discussed the possible outcomes as well as the risk and benefits.  Patient recently stopped Plavix will likely need to be initiated if intervention is undertaken.  He and his daughter demonstrate very good understanding we will get him scheduled on a nondialysis day in the very near future.     [name] MD [facility]

## 2020-07-25 NOTE — Discharge Instructions (Signed)
Femoral Site Care  This sheet gives you information about how to care for yourself after your procedure. Your health care provider may also give you more specific instructions. If you have problems or questions, contact your health care provider. What can I expect after the procedure? After the procedure, it is common to have:  Bruising that usually fades within 1-2 weeks.  Tenderness at the site. Follow these instructions at home: Wound care  Follow instructions from your health care provider about how to take care of your insertion site. Make sure you: ? Wash your hands with soap and water before you change your bandage (dressing). If soap and water are not available, use hand sanitizer. ? Change your dressing as told by your health care provider. ? Leave stitches (sutures), skin glue, or adhesive strips in place. These skin closures may need to stay in place for 2 weeks or longer. If adhesive strip edges start to loosen and curl up, you may trim the loose edges. Do not remove adhesive strips completely unless your health care provider tells you to do that.  Do not take baths, swim, or use a hot tub until your health care provider approves.  You may shower 24-48 hours after the procedure or as told by your health care provider. ? Gently wash the site with plain soap and water. ? Pat the area dry with a clean towel. ? Do not rub the site. This may cause bleeding.  Do not apply powder or lotion to the site. Keep the site clean and dry.  Check your femoral site every day for signs of infection. Check for: ? Redness, swelling, or pain. ? Fluid or blood. ? Warmth. ? Pus or a bad smell. Activity  For the first 2-3 days after your procedure, or as long as directed: ? Avoid climbing stairs as much as possible. ? Do not squat.  Do not lift anything that is heavier than 10 lb (4.5 kg), or the limit that you are told, until your health care provider says that it is safe.  Rest as  directed. ? Avoid sitting for a long time without moving. Get up to take short walks every 1-2 hours.  Do not drive for 24 hours if you were given a medicine to help you relax (sedative). General instructions  Take over-the-counter and prescription medicines only as told by your health care provider.  Keep all follow-up visits as told by your health care provider. This is important. Contact a health care provider if you have:  A fever or chills.  You have redness, swelling, or pain around your insertion site. Get help right away if:  The catheter insertion area swells very fast.  You pass out.  You suddenly start to sweat or your skin gets clammy.  The catheter insertion area is bleeding, and the bleeding does not stop when you hold steady pressure on the area.  The area near or just beyond the catheter insertion site becomes pale, cool, tingly, or numb. These symptoms may represent a serious problem that is an emergency. Do not wait to see if the symptoms will go away. Get medical help right away. Call your local emergency services (911 in the [place] Do not drive yourself to the hospital. Summary  After the procedure, it is common to have bruising that usually fades within 1-2 weeks.  Check your femoral site every day for signs of infection.  Do not lift anything that is heavier than 10 lb (4.5 kg), or   the limit that you are told, until your health care provider says that it is safe. This information is not intended to replace advice given to you by your health care provider. Make sure you discuss any questions you have with your health care provider. Document Revised: 10/23/2019 Document Reviewed: 10/23/2019 Elsevier Patient Education  2021 Elsevier Inc.  

## 2020-07-25 NOTE — Progress Notes (Addendum)
Site area: rt groin fa sheath Site Prior to Removal:  Level 0 Pressure Applied For: 25 minutes Manual:   yes Patient Status During Pull:  stable Post Pull Site:  Level 0 Post Pull Instructions Given:  yes Post Pull Pulses Present: rt dp dopplered Dressing Applied:  Gauze and tegaderm Bedrest begins @ 2446 Comments:

## 2020-07-25 NOTE — Op Note (Signed)
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  Ultrasound-guided access of right common femoral artery 2.  Aortogram including catheter selection of aorta 3.  Left lower extremity arteriogram with selection of third order branches 4.  Left distal SFA above-knee popliteal artery angioplasty with stent placement (predilated with a 4 mm Sterline, stented with 6 mm x 100 mm Viabahn, postdilated with a 6 mm Jade) 5.  Angioplasty of left behind the knee popliteal artery (4 mm x 100 mm Jade) 6.  65 minutes of monitored moderate conscious sedation time  Indications: 80 year old male with ESRD who was seen in the office on Friday by my partner Dr. Donzetta Matters with critical limb ischemia of the left lower extremity with tissue loss.  CT scan had shown distal left SFA above-knee popliteal artery occlusion.  He presents today for left lower extremity arteriogram and possible intervention after risk benefits discussed.  Findings:   Aortogram showed no flow-limiting stenosis in the aortoiliac segment although this is heavily calcified.  Left lower extremity arteriogram showed a diseased SFA with a distal SFA above-knee popliteal occlusion approximately 100 mm in length.  The behind the knee popliteal artery had a high-grade focal calcified stenosis greater than 80%.  Dominant runoff in the left lower extremity was the anterior tibial artery.  Ultimately the chronic total occlusion was crossed in the left SFA above knee popliteal artery and predilated with a 4 mm balloon and stented with a 6 mm Viabahn and post-dilated with a 6 mm balloon.  I then performed angioplasty of the left behind the knee popliteal artery with a 4 mm balloon with good results and no dissection.    Widely patent stent  with inline flow down the anterior tibial that is very brisk.  There is some disease in proximal anterior tibial but not flow limiting.   Procedure:  The patient was identified in the holding area and taken to room 8.  The patient was then placed supine on the table and prepped and draped in the usual sterile fashion.  A time out was called.  Ultrasound was used to evaluate the right common femoral artery.  It was patent .  A digital ultrasound image was acquired.  A micropuncture needle was used to access the right common femoral artery under ultrasound guidance.  An 018 wire was advanced without resistance and a micropuncture sheath was placed.  The 018 wire was removed and a benson wire was placed.  The micropuncture sheath was exchanged for a 5 french sheath.  An omniflush catheter was advanced over the wire to the level of L-1.  An abdominal angiogram was obtained.  Next, using the omniflush catheter and a benson wire, the aortic bifurcation was crossed and the catheter was placed into theleft external iliac artery and left runoff was obtained.  We went ahead and planned to try and intervene by crossing the left distal SFA above-knee popliteal artery occlusion.  I used a Glidewire advantage down the left SFA and exchanged for a long 6 [place] Ansell sheath in the right groin over the aortic bifurcation.  Patient was given 100 units/kg IV heparin.  We then used a Glidewire advantage with the 035 quick cross to cross the distal left SFA above-knee popliteal occlusion and get back in the true lumen.  I had  to use a V18 wire to cross the behind the knee popliteal stenosis.  Given I had difficulty even getting a quick cross catheter to track through the left SFA/popliteal CTA, I elected to predilate the lesion with a 4 mm x 100 mm Sterline.  This was then stented with a 6 mm x 100 mm Viabahn postdilated with a 6 mm Jade.  Excellent results with a widely patent stent and no residual stenosis or dissection.  I then  went and angioplastied in the behind the knee popliteal artery distal to the stent where there was a 80% calcified stenosis with a 4 mm Jade to nominal pressure for 2 minutes.  He has no evidence of dissection with less than 30% residual calcified stenosis.  Preserved runoff in the left through the anterior tibial into the foot.  Wires and catheters were removed.  Plan: Patient will need dual antiplatelet therapy.  Will arrange follow-up in 1 month with left leg arterial duplex and ABIs  Marty Heck, MD [facility] [place] Office: [place]

## 2020-07-26 ENCOUNTER — Encounter (HOSPITAL_COMMUNITY): Payer: Self-pay | Admitting: Vascular Surgery

## 2020-07-26 DIAGNOSIS — Z992 Dependence on renal dialysis: Secondary | ICD-10-CM | POA: Diagnosis not present

## 2020-07-26 DIAGNOSIS — N2581 Secondary hyperparathyroidism of renal origin: Secondary | ICD-10-CM | POA: Diagnosis not present

## 2020-07-26 DIAGNOSIS — N186 End stage renal disease: Secondary | ICD-10-CM | POA: Diagnosis not present

## 2020-07-28 ENCOUNTER — Other Ambulatory Visit: Payer: Self-pay | Admitting: *Deleted

## 2020-07-28 DIAGNOSIS — I739 Peripheral vascular disease, unspecified: Secondary | ICD-10-CM

## 2020-07-28 DIAGNOSIS — N2581 Secondary hyperparathyroidism of renal origin: Secondary | ICD-10-CM | POA: Diagnosis not present

## 2020-07-28 DIAGNOSIS — N186 End stage renal disease: Secondary | ICD-10-CM | POA: Diagnosis not present

## 2020-07-28 DIAGNOSIS — Z992 Dependence on renal dialysis: Secondary | ICD-10-CM | POA: Diagnosis not present

## 2020-07-30 ENCOUNTER — Other Ambulatory Visit: Payer: Self-pay | Admitting: Vascular Surgery

## 2020-07-30 DIAGNOSIS — E1140 Type 2 diabetes mellitus with diabetic neuropathy, unspecified: Secondary | ICD-10-CM

## 2020-07-30 DIAGNOSIS — N2581 Secondary hyperparathyroidism of renal origin: Secondary | ICD-10-CM | POA: Diagnosis not present

## 2020-07-30 DIAGNOSIS — Z992 Dependence on renal dialysis: Secondary | ICD-10-CM | POA: Diagnosis not present

## 2020-07-30 DIAGNOSIS — N186 End stage renal disease: Secondary | ICD-10-CM | POA: Diagnosis not present

## 2020-07-30 MED ORDER — OXYCODONE-ACETAMINOPHEN 5-325 MG PO TABS: 1.0000 | ORAL_TABLET | Freq: Four times a day (QID) | ORAL | 0 refills | Status: DC | PRN

## 2020-08-02 ENCOUNTER — Other Ambulatory Visit: Payer: Self-pay

## 2020-08-02 ENCOUNTER — Ambulatory Visit (INDEPENDENT_AMBULATORY_CARE_PROVIDER_SITE_OTHER): Payer: Medicare Other | Admitting: Physician Assistant

## 2020-08-02 VITALS — BP 136/72 | HR 81 | Temp 98.2°F | Resp 20 | Ht 65.0 in | Wt 161.0 lb

## 2020-08-02 DIAGNOSIS — E1140 Type 2 diabetes mellitus with diabetic neuropathy, unspecified: Secondary | ICD-10-CM

## 2020-08-02 DIAGNOSIS — N186 End stage renal disease: Secondary | ICD-10-CM | POA: Diagnosis not present

## 2020-08-02 DIAGNOSIS — I739 Peripheral vascular disease, unspecified: Secondary | ICD-10-CM | POA: Diagnosis not present

## 2020-08-02 DIAGNOSIS — Z992 Dependence on renal dialysis: Secondary | ICD-10-CM | POA: Diagnosis not present

## 2020-08-02 DIAGNOSIS — I129 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 kidney disease, or unspecified chronic kidney disease: Secondary | ICD-10-CM | POA: Diagnosis not present

## 2020-08-02 MED ORDER — OXYCODONE-ACETAMINOPHEN 5-325 MG PO TABS: 1.0000 | ORAL_TABLET | Freq: Four times a day (QID) | ORAL | 0 refills | Status: DC | PRN

## 2020-08-02 NOTE — Progress Notes (Signed)
HISTORY AND PHYSICAL     CC:  follow up. Requesting Provider:  [name], MD  HPI: This is a 80 y.o. male who is here today for follow up for PAD.  He has history of end-stage renal disease currently dialyzes Tuesdays, Thursdays and Saturdays via left arm basilic vein fistula at the [facility] location. When he was seen on 07/25/2020 he was having pain in his left foot as well as superficial ulceration.  He was walking with help of a cane. Patient was taking aspirin daily and was recently on Plavix but this was stopped. His foot was hurting all the time including at rest. He had never had lower extremity intervention in the past.  He was taken to the [place] lab on 07/25/2020 and underwent Left lower extremity arteriogram with selection of third order branches and Left distal SFA above-knee popliteal artery angioplasty with stent placement (predilated with a 4 mm Sterline, stented with 6 mm x 100 mm Viabahn, postdilated with a 6 mm Jade) and Angioplasty of left behind the knee popliteal artery (4 mm x 100 mm Jade) by Dr. Carlis Abbott.  Findings as follows: Aortogram showed no flow-limiting stenosis in the aortoiliac segment although this is heavily calcified.  Left lower extremity arteriogram showed a diseased SFA with a distal SFA above-knee popliteal occlusion approximately 100 mm in length.  The behind the knee popliteal artery had a high-grade focal calcified stenosis greater than 80%.  Dominant runoff in the left lower extremity was the anterior tibial artery.  Ultimately the chronic total occlusion was crossed in the left SFA above knee popliteal artery and predilated with a 4 mm balloon and stented with a 6 mm Viabahn and post-dilated with a 6 mm balloon.  I then performed angioplasty of the left behind the knee popliteal artery with a 4 mm balloon with good results and no dissection.    Widely patent stent with inline flow down the anterior tibial that is very brisk.  There is some  disease in proximal anterior tibial but not flow limiting.    Pt's daughter who is a nurse had called Dr. Carlis Abbott over the holiday weekend stating her father was having continued pain in the left foot including the toes and his heel.  She stated his foot was warm and had a faintly palpable pulse.  Pain medicine rx was sent to his pharmacy and he was scheduled to be seen today.  Pt states that his pain continues.  He states he didn't go to HD today bc of the pain.  He states that he fell today saying that his thighs felt weak.  He states that his left heel hurts more than the great toe.  His daughter has been painting his toes with betadine.  The pain is keeping him up at night.   He is also taking Neurontin.   The pt is on a statin for cholesterol management.    The pt is on an aspirin.    Other AC:  Plavix The pt is on BB for hypertension.  The pt does have diabetes. Tobacco hx:  former   Past Medical History:  Diagnosis Date  . Anemia    low iron  . Arthritis   . Cancer [facility]) ?2006   prostate  . Colon polyps ~ 1993 and 2003   Dr [name], [facility].  08/2001 colonoscopy: tubular adenoma at cecum.    . 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  Elevated cholesterol with high triglycerides   . ESRD (end stage renal disease) ([place])    [facility]  . GERD (gastroesophageal reflux disease)   . Hypertension   . Legally blind in left eye, as defined in [place]    has pinpoint vision in right eye  . RBBB     Past Surgical History:  Procedure Laterality Date  . ABDOMINAL AORTOGRAM W/LOWER EXTREMITY N/A 07/25/2020   Procedure: ABDOMINAL AORTOGRAM W/LOWER EXTREMITY;  Surgeon: Marty Heck, MD;  Location: [place] [facility];  Service: Cardiovascular;  Laterality: N/A;  . AV FISTULA PLACEMENT Left 03/20/2018   Procedure: ARTERIOVENOUS (AV) FISTULA CREATION ARM;  Surgeon: [name], MD;  Location: [place];  Service: Vascular;   Laterality: Left;  . BASCILIC VEIN TRANSPOSITION Left 05/21/2018   Procedure: LEFT BASILIC VEIN FISTULA SECOND STAGE;  Surgeon: [name], MD;  Location: [place];  Service: Vascular;  Laterality: Left;  . COLONOSCOPY    . COLONOSCOPY WITH PROPOFOL N/A 11/30/2019   Procedure: COLONOSCOPY WITH PROPOFOL;  Surgeon: Ronnette Juniper, MD;  Location: [place];  Service: Gastroenterology;  Laterality: N/A;  . GLAUCOMA SURGERY  2019   multiple surgeries  . HEMOSTASIS CLIP PLACEMENT  11/30/2019   Procedure: HEMOSTASIS CLIP PLACEMENT;  Surgeon: Ronnette Juniper, MD;  Location: [place];  Service: Gastroenterology;;  . KNEE ARTHROSCOPY    . MULTIPLE EXTRACTIONS WITH ALVEOLOPLASTY N/A 12/30/2019   Procedure: MULTIPLE EXTRACTION WITH ALVEOLOPLASTY;  Surgeon: Charlaine Dalton, DMD;  Location: [place];  Service: Dentistry;  Laterality: N/A;  . PERIPHERAL VASCULAR INTERVENTION Left 07/25/2020   Procedure: PERIPHERAL VASCULAR INTERVENTION;  Surgeon: Marty Heck, MD;  Location: [place] [facility];  Service: Cardiovascular;  Laterality: Left;  superficial femoral  . POLYPECTOMY  11/30/2019   Procedure: POLYPECTOMY;  Surgeon: Ronnette Juniper, MD;  Location: [place];  Service: Gastroenterology;;  . PROSTATE SURGERY    . RIGHT/LEFT HEART CATH AND CORONARY ANGIOGRAPHY N/A 12/03/2019   Procedure: RIGHT/LEFT HEART CATH AND CORONARY ANGIOGRAPHY;  Surgeon: Burnell Blanks, MD;  Location: [place] [facility];  Service: Cardiovascular;  Laterality: N/A;  . SUBMUCOSAL TATTOO INJECTION  11/30/2019   Procedure: SUBMUCOSAL TATTOO INJECTION;  Surgeon: Ronnette Juniper, MD;  Location: [facility];  Service: Gastroenterology;;  . TEE WITHOUT CARDIOVERSION N/A 01/19/2020   Procedure: TRANSESOPHAGEAL ECHOCARDIOGRAM (TEE);  Surgeon: Burnell Blanks, MD;  Location: [place] [facility];  Service: Open Heart Surgery;  Laterality: N/A;  . TRANSCATHETER AORTIC VALVE REPLACEMENT, TRANSFEMORAL Left 01/19/2020    Procedure: TRANSCATHETER AORTIC VALVE REPLACEMENT, LEFT TRANSFEMORAL;  Surgeon: Burnell Blanks, MD;  Location: [place] [facility];  Service: Open Heart Surgery;  Laterality: Left;    No Known Allergies  Current Outpatient Medications  Medication Sig Dispense Refill  . acetaminophen (TYLENOL) 500 MG tablet Take 1,000 mg by mouth every 6 (six) hours as needed for moderate pain or headache.    [name] aspirin EC 81 MG tablet Take 81 mg by mouth daily. Swallow whole.    [name] atorvastatin (LIPITOR) 10 MG tablet TAKE 1 TABLET BY MOUTH EVERY DAY (Patient taking differently: Take 10 mg by mouth daily.) 90 tablet 0  . AURYXIA 1 GM 210 MG(Fe) tablet Take 420 mg by mouth 3 (three) times daily.    . B Complex-C-Folic Acid (DIALYVITE 300) 0.8 MG TABS Take 0.8 mg by mouth daily.     . blood glucose meter kit and supplies KIT Dispense based on patient and insurance preference. Use  up to four times daily as directed. (FOR ICD-9 250.00, 250.01). For QAC - HS accuchecks. 1 each 1  . brimonidine (ALPHAGAN) 0.15 % ophthalmic solution Place 1 drop into the right eye 3 (three) times daily.    . carvedilol (COREG) 3.125 MG tablet TAKE 1 TABLET(3.125 MG) BY MOUTH TWICE DAILY WITH A MEAL (Patient taking differently: Take 3.125 mg by mouth 2 (two) times daily with a meal.) 180 tablet 1  . clopidogrel (PLAVIX) 75 MG tablet Take 1 tablet (75 mg total) by mouth daily with breakfast. 30 tablet 5  . dorzolamide-timolol (COSOPT) 22.3-6.8 MG/ML ophthalmic solution Place 1 drop into the right eye 2 (two) times daily.  11  . gabapentin (NEURONTIN) 100 MG capsule Take 1 capsule (100 mg total) by mouth at bedtime. (Patient taking differently: Take 100-200 mg by mouth See admin instructions. Take 200 mg daily on Tues, Thurs, and Sat. Take 100 mg daily on Mon, Wed, Fri, and Sun.) 90 capsule 1  . hydrocortisone cream 1 % Apply 1 application topically 3 (three) times daily as needed for itching.    . hydroxypropyl methylcellulose /  hypromellose (ISOPTO TEARS / GONIOVISC) 2.5 % ophthalmic solution Place 1 drop into the left eye as needed for dry eyes.    . isosorbide mononitrate (IMDUR) 30 MG 24 hr tablet TAKE 1 TABLET(30 MG) BY MOUTH DAILY (Patient taking differently: Take 30 mg by mouth daily.) 90 tablet 1  . Lancets (ONETOUCH DELICA PLUS PPIRJJ88C) MISC     . ONETOUCH VERIO test strip USE AS DIRECTED TO TEST FOUR TIMES A DAY 100 each 3  . oxyCODONE-acetaminophen (PERCOCET) 5-325 MG tablet Take 1 tablet by mouth every 6 (six) hours as needed. 20 tablet 0  . pantoprazole (PROTONIX) 40 MG tablet Take 1 tablet (40 mg total) by mouth daily at 12 noon. 90 tablet 1  . pilocarpine (PILOCAR) 4 % ophthalmic solution Place 1 drop into the right eye 4 (four) times daily.     [name] ROCKLATAN 0.02-0.005 % SOLN Place 1 drop into the right eye at bedtime.     No current facility-administered medications for this visit.    Family History  Problem Relation Age of Onset  . CAD Mother     Social History   Socioeconomic History  . Marital status: Widowed    Spouse name: Not on file  . Number of children: Not on file  . Years of education: Not on file  . Highest education level: Not on file  Occupational History  . Occupation: retired  Tobacco Use  . Smoking status: Former Smoker    Packs/day: 0.50    Quit date: 03/23/2018    Years since quitting: 2.3  . Smokeless tobacco: Never Used  Vaping Use  . Vaping Use: Never used  Substance and Sexual Activity  . Alcohol use: No  . Drug use: No  . Sexual activity: Not Currently  Other Topics Concern  . Not on file  Social History Narrative  . Not on file   Social Determinants of Health   Financial Resource Strain: Low Risk   . Difficulty of Paying Living Expenses: Not hard at all  Food Insecurity: No Food Insecurity  . Worried About Charity fundraiser in the Last Year: Never true  . Ran Out of Food in the Last Year: Never true  Transportation Needs: No Transportation Needs   . Lack of Transportation (Medical): No  . Lack of Transportation (Non-Medical): No  Physical Activity: Inactive  .  Days of Exercise per Week: 0 days  . Minutes of Exercise per Session: 0 min  Stress: No Stress Concern Present  . Feeling of Stress : Not at all  Social Connections: Not on file  Intimate Partner Violence: Not on file     REVIEW OF SYSTEMS:   [X] denotes positive finding, [ ] denotes negative finding Cardiac  Comments:  Chest pain or chest pressure:    Shortness of breath upon exertion:    Short of breath when lying flat:    Irregular heart rhythm:        Vascular    Pain in calf, thigh, or hip brought on by ambulation:    Pain in feet at night that wakes you up from your sleep:  x   Blood clot in your veins:    Leg swelling:         Pulmonary    Oxygen at home:    Productive cough:     Wheezing:         Neurologic    Sudden weakness in arms or legs:     Sudden numbness in arms or legs:     Sudden onset of difficulty speaking or slurred speech:    Temporary loss of vision in one eye:     Problems with dizziness:         Gastrointestinal    Blood in stool:     Vomited blood:         Genitourinary    Burning when urinating:     Blood in urine:        Psychiatric    Major depression:         Hematologic    Bleeding problems:    Problems with blood clotting too easily:        Skin    Rashes or ulcers:        Constitutional    Fever or chills:      PHYSICAL EXAMINATION:  Today's Vitals   08/02/20 1505  BP: 136/72  Pulse: 81  Resp: 20  Temp: 98.2 F (36.8 C)  TempSrc: Temporal  SpO2: 98%  Weight: 161 lb (73 kg)  Height: 5' 5" (1.651 m)  PainSc: 10-Worst pain ever   Body mass index is 26.79 kg/m.   General:  WDWN in NAD; vital signs documented above Gait: Not observed-in wheelchair today HENT: WNL, normocephalic Pulmonary: normal non-labored breathing , without wheezing Cardiac: regular HR Skin: without rashes Vascular  Exam/Pulses: Brisk right DP>PT/peroneal doppler signals.  Left DP doppler signal. Extremities:         Musculoskeletal: no muscle wasting or atrophy  Neurologic: A&O X 3;  No focal weakness or paresthesias are detected Psychiatric:  The pt has flat affect.   Non-Invasive Vascular Imaging:   None today   ASSESSMENT/PLAN:: 80 y.o. male here for follow up for rest pain in the left great toe and heel and he is s/p Left lower extremity arteriogram with selection of third order branches and Left distal SFA above-knee popliteal artery angioplasty with stent placement (predilated with a 4 mm Sterline, stented with 6 mm x 100 mm Viabahn, postdilated with a 6 mm Jade) and Angioplasty of left behind the knee popliteal artery (4 mm x 100 mm Jade) by Dr. Carlis Abbott.   -pt with brisk left DP>PT/peroneal doppler signals.  Pt with wounds on all 5 toes with gangrenous changes to the left great toe.   -Dr. Carlis Abbott had long discussion with pt and  his daughter.  Given he is a dialysis pt, toe amputation or TMA will be difficult to heal but if he is having toe pain, we could proceed with great toe amputation or TMA.  This would not correct his heel pain.  Also discussed giving this a week and seeing if this improves and allowing the great toe to further demarcate.  He elected to come back in one week and we can re-evaluate and make decisions at that time.  He is at risk for more proximal amputation if toe amputation or TMA does not heal given his renal failure and PAD. -they will continue painting his toes with betadine daily, protecting his feet and stressed importance of floating his heel to help prevent any pressure sore.  If continued pain, could get xray of left foot.  Dr. Carlis Abbott felt at this point, that would be low yield.   -daughter in agreement with this plan.  They will return on 6/7 on the PA schedule and Dr. Carlis Abbott will come see the pt at that time. They will call sooner if he needs [place] sooner. -Rx for  percocet 5/325 one q6h prn pain sent to pharmacy per Dr. Carlis Abbott.  Pt's daughter aware that they may not fill rx today. -PDMP reviewed.    Leontine Locket, [facility] [phone]  Clinic MD:   Pt seen and examined with Dr. Carlis Abbott

## 2020-08-03 ENCOUNTER — Encounter (HOSPITAL_COMMUNITY): Payer: Self-pay

## 2020-08-03 ENCOUNTER — Ambulatory Visit: Payer: Medicare Other

## 2020-08-03 ENCOUNTER — Emergency Department (HOSPITAL_COMMUNITY): Payer: Medicare Other

## 2020-08-03 ENCOUNTER — Emergency Department (HOSPITAL_COMMUNITY)
Admission: EM | Admit: 2020-08-03 | Discharge: 2020-08-03 | Disposition: A | Discharge status: Home / Self Care | Payer: Medicare Other | Attending: Emergency Medicine | Admitting: Emergency Medicine

## 2020-08-03 ENCOUNTER — Other Ambulatory Visit: Payer: Self-pay

## 2020-08-03 DIAGNOSIS — Z87891 Personal history of nicotine dependence: Secondary | ICD-10-CM | POA: Diagnosis not present

## 2020-08-03 DIAGNOSIS — W1811XA Fall from or off toilet without subsequent striking against object, initial encounter: Secondary | ICD-10-CM | POA: Diagnosis not present

## 2020-08-03 DIAGNOSIS — N186 End stage renal disease: Secondary | ICD-10-CM | POA: Diagnosis not present

## 2020-08-03 DIAGNOSIS — Y92002 Bathroom of unspecified non-institutional (private) residence single-family (private) house as the place of occurrence of the external cause: Secondary | ICD-10-CM | POA: Insufficient documentation

## 2020-08-03 DIAGNOSIS — Z79899 Other long term (current) drug therapy: Secondary | ICD-10-CM | POA: Diagnosis not present

## 2020-08-03 DIAGNOSIS — Z743 Need for continuous supervision: Secondary | ICD-10-CM | POA: Diagnosis not present

## 2020-08-03 DIAGNOSIS — R9431 Abnormal electrocardiogram [ECG] [EKG]: Secondary | ICD-10-CM | POA: Diagnosis not present

## 2020-08-03 DIAGNOSIS — E1129 Type 2 diabetes mellitus with other diabetic kidney complication: Secondary | ICD-10-CM | POA: Diagnosis not present

## 2020-08-03 DIAGNOSIS — Z7982 Long term (current) use of aspirin: Secondary | ICD-10-CM | POA: Diagnosis not present

## 2020-08-03 DIAGNOSIS — Z043 Encounter for examination and observation following other accident: Secondary | ICD-10-CM | POA: Diagnosis not present

## 2020-08-03 DIAGNOSIS — Z8546 Personal history of malignant neoplasm of prostate: Secondary | ICD-10-CM | POA: Insufficient documentation

## 2020-08-03 DIAGNOSIS — R531 Weakness: Secondary | ICD-10-CM | POA: Diagnosis not present

## 2020-08-03 DIAGNOSIS — I132 Hypertensive heart and chronic kidney disease with heart failure and with stage 5 chronic kidney disease, or end stage renal disease: Secondary | ICD-10-CM | POA: Diagnosis not present

## 2020-08-03 DIAGNOSIS — E1122 Type 2 diabetes mellitus with diabetic chronic kidney disease: Secondary | ICD-10-CM

## 2020-08-03 DIAGNOSIS — W19XXXA Unspecified fall, initial encounter: Secondary | ICD-10-CM | POA: Diagnosis not present

## 2020-08-03 DIAGNOSIS — Z20822 Contact with and (suspected) exposure to covid-19: Secondary | ICD-10-CM | POA: Diagnosis not present

## 2020-08-03 DIAGNOSIS — I5033 Acute on chronic diastolic (congestive) heart failure: Secondary | ICD-10-CM | POA: Insufficient documentation

## 2020-08-03 DIAGNOSIS — Z992 Dependence on renal dialysis: Secondary | ICD-10-CM | POA: Diagnosis not present

## 2020-08-03 DIAGNOSIS — M19072 Primary osteoarthritis, left ankle and foot: Secondary | ICD-10-CM | POA: Diagnosis not present

## 2020-08-03 DIAGNOSIS — S99922A Unspecified injury of left foot, initial encounter: Secondary | ICD-10-CM | POA: Diagnosis not present

## 2020-08-03 DIAGNOSIS — I129 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 kidney disease, or unspecified chronic kidney disease: Secondary | ICD-10-CM | POA: Diagnosis not present

## 2020-08-03 LAB — CBC
HCT: 35.5 % — ABNORMAL LOW (ref 39.0–52.0)
Hemoglobin: 11.2 g/dL — ABNORMAL LOW (ref 13.0–17.0)
MCH: 33.1 pg (ref 26.0–34.0)
MCHC: 31.5 g/dL (ref 30.0–36.0)
MCV: 105 fL — ABNORMAL HIGH (ref 80.0–100.0)
Platelets: 280 10*3/uL (ref 150–400)
RBC: 3.38 MIL/uL — ABNORMAL LOW (ref 4.22–5.81)
RDW: 14.5 % (ref 11.5–15.5)
WBC: 8 10*3/uL (ref 4.0–10.5)
nRBC: 0 % (ref 0.0–0.2)

## 2020-08-03 LAB — BASIC METABOLIC PANEL
Anion gap: 16 — ABNORMAL HIGH (ref 5–15)
BUN: 49 mg/dL — ABNORMAL HIGH (ref 8–23)
CO2: 24 mmol/L (ref 22–32)
Calcium: 9.3 mg/dL (ref 8.9–10.3)
Chloride: 100 mmol/L (ref 98–111)
Creatinine, Ser: 9.47 mg/dL — ABNORMAL HIGH (ref 0.61–1.24)
GFR, Estimated: 5 mL/min — ABNORMAL LOW (ref >60)
Glucose, Bld: 193 mg/dL — ABNORMAL HIGH (ref 70–99)
Potassium: 5.1 mmol/L (ref 3.5–5.1)
Sodium: 140 mmol/L (ref 135–145)

## 2020-08-03 LAB — URINALYSIS, ROUTINE W REFLEX MICROSCOPIC
Bacteria, UA: NONE SEEN
Bilirubin Urine: NEGATIVE
Glucose, UA: NEGATIVE mg/dL
Hgb urine dipstick: NEGATIVE
Ketones, ur: NEGATIVE mg/dL
Leukocytes,Ua: NEGATIVE
Nitrite: NEGATIVE
Protein, ur: 100 mg/dL — AB
Specific Gravity, Urine: 1.012 (ref 1.005–1.030)
pH: 9 — ABNORMAL HIGH (ref 5.0–8.0)

## 2020-08-03 LAB — CBG MONITORING, ED: Glucose-Capillary: 164 mg/dL — ABNORMAL HIGH (ref 70–99)

## 2020-08-03 LAB — RESP PANEL BY RT-PCR (FLU A&B, COVID) ARPGX2
Influenza A by PCR: NEGATIVE
Influenza B by PCR: NEGATIVE
SARS Coronavirus 2 by RT PCR: NEGATIVE

## 2020-08-03 NOTE — ED Triage Notes (Signed)
Pt BIB [facility] from home, 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e slid down while trying to get of the toilet because Left knee was weak. Pt with severe vascular compromise, pt is supposed to have partial Left foot amputation. Seen surgeon yesterday, plan was to have surgery today for partial amputation of said foot. Pt with new complaints of heel pain to surgeon yesterday, he wants to monitor foot to see if they need to include whole or BKA.  Knee weakness started yesterday, pt had a similar event where his knee gave out yesterday while walking. No trauma today or yesterday. Daughter is a Marine scientist and wanted pt to be evaluated. Pt states he feels fine other than his knee feels weak.    BP 140/72 HR 86 RR 16 96% RA

## 2020-08-03 NOTE — ED Provider Notes (Addendum)
[facility] EMERGENCY DEPARTMENT Provider Note   CSN: [id number] Arrival date & time: 08/03/20  1248     History Chief Complaint  Patient presents with  . Weakness    NARCISO STOUTENBURG is a 80 y.o. male.  HPI Patient with multiple medical issues including diabetes, neuropathy, vasculopathy presents with discomfort in his left foot, ankle.  Patient has baseline weakness, this is seemingly unchanged, but with an exacerbation of generalized weakness he had a minor fall earlier today.  He struck his left foot, left ankle.  Notably, the patient is undergoing evaluation for possible amputation of the distal left foot due to necrotic changes in the first second third toes. He denies other substantial injuries during the fall, any pain in his head, chest, pelvis.   Chart review notable for ongoing evaluation with our vascular surgery colleagues.  Patient had stent procedure performed 1 week ago, pertinent notes as below:  He was taken to the [facility] lab on 07/25/2020 and underwent Left lower extremity arteriogram with selection of third order branches and Left distal SFA above-knee popliteal artery angioplasty with stent placement (predilated with a 4 mm Sterline, stented with 6 mm x 100 mm Viabahn, postdilated with a 6 mm Jade) and Angioplasty of left behind the knee popliteal artery (4 mm x 100 mm Jade) by Dr. Carlis Abbott.     Past Medical History:  Diagnosis Date  . Anemia    low iron  . Arthritis   . Cancer [facility]) ?2006   prostate  . Colon polyps ~ 1993 and 2003   Dr [name], [facility].  08/2001 colonoscopy: tubular adenoma at cecum.    . 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 Elevated cholesterol with high triglycerides   . ESRD (end stage renal disease) ([place])    [facility]  . GERD (gastroesophageal reflux disease)   . Hypertension   . Legally blind in left eye, as defined in [place]    has pinpoint vision in right eye  . RBBB      Patient Active Problem List   Diagnosis Date Noted  . 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  . Acute on chronic diastolic heart failure ([place]) 01/19/2020  . 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 RBBB   . Near syncope 12/13/2019  . Severe aortic stenosis   . ESRD (end stage renal disease) ([place]) 11/26/2019  . Macrocytic anemia 11/26/2019  . Adrenal mass ([place]) 11/26/2019  . 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 ARF (acute renal failure) ([place]) 03/25/2018  . Vision loss of right eye 11/11/2017  . Bilateral pseudophakia 07/11/2017  . GERD (gastroesophageal reflux disease) 09/10/2016  . HLD (hyperlipidemia) 09/10/2016  . Essential hypertension 08/13/2016  . 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  . Type 2 diabetes mellitus with renal manifestations ([place]) 10/22/2006  . BPH (benign prostatic hyperplasia) 10/22/2006  . COLONIC POLYPS, HX OF 10/22/2006    Past Surgical History:  Procedure Laterality Date  . ABDOMINAL AORTOGRAM W/LOWER EXTREMITY N/A 07/25/2020   Procedure: ABDOMINAL AORTOGRAM W/LOWER EXTREMITY;  Surgeon: Marty Heck, MD;  Location: [facility] CV LAB;  Service: Cardiovascular;  Laterality: N/A;  . AV FISTULA PLACEMENT Left 03/20/2018   Procedure: ARTERIOVENOUS (AV) FISTULA CREATION ARM;  Surgeon: [name], MD;  Location: [facility];  Service: Vascular;  Laterality: Left;  . BASCILIC VEIN TRANSPOSITION Left 05/21/2018   Procedure: LEFT BASILIC VEIN FISTULA SECOND STAGE;  Surgeon: [name], MD;  [facility];  Service: Vascular;  Laterality: Left;  . COLONOSCOPY    . COLONOSCOPY WITH PROPOFOL N/A 11/30/2019   Procedure: COLONOSCOPY WITH PROPOFOL;  Surgeon: Ronnette Juniper, MD;  Location: [facility];  Service: Gastroenterology;  Laterality: N/A;  . GLAUCOMA SURGERY  2019   multiple surgeries  . HEMOSTASIS CLIP PLACEMENT  11/30/2019   Procedure: HEMOSTASIS CLIP PLACEMENT;  Surgeon: Ronnette Juniper, MD;  Location: [facility];  Service:  Gastroenterology;;  . KNEE ARTHROSCOPY    . MULTIPLE EXTRACTIONS WITH ALVEOLOPLASTY N/A 12/30/2019   Procedure: MULTIPLE EXTRACTION WITH ALVEOLOPLASTY;  Surgeon: Charlaine Dalton, DMD;  Location: [facility];  Service: Dentistry;  Laterality: N/A;  . PERIPHERAL VASCULAR INTERVENTION Left 07/25/2020   Procedure: PERIPHERAL VASCULAR INTERVENTION;  Surgeon: Marty Heck, MD;  Location: [facility] CV LAB;  Service: Cardiovascular;  Laterality: Left;  superficial femoral  . POLYPECTOMY  11/30/2019   Procedure: POLYPECTOMY;  Surgeon: Ronnette Juniper, MD;  Location: [facility];  Service: Gastroenterology;;  . PROSTATE SURGERY    . RIGHT/LEFT HEART CATH AND CORONARY ANGIOGRAPHY N/A 12/03/2019   Procedure: RIGHT/LEFT HEART CATH AND CORONARY ANGIOGRAPHY;  Surgeon: Burnell Blanks, MD;  Location: [facility] [place] LAB;  Service: Cardiovascular;  Laterality: N/A;  . SUBMUCOSAL TATTOO INJECTION  11/30/2019   Procedure: SUBMUCOSAL TATTOO INJECTION;  Surgeon: Ronnette Juniper, MD;  Location: [facility] ENDOSCOPY;  Service: Gastroenterology;;  . TEE WITHOUT CARDIOVERSION N/A 01/19/2020   Procedure: TRANSESOPHAGEAL ECHOCARDIOGRAM (TEE);  Surgeon: Burnell Blanks, MD;  Location: [facility] CV LAB;  Service: Open Heart Surgery;  Laterality: N/A;  . TRANSCATHETER AORTIC VALVE REPLACEMENT, TRANSFEMORAL Left 01/19/2020   Procedure: TRANSCATHETER AORTIC VALVE REPLACEMENT, LEFT TRANSFEMORAL;  Surgeon: Burnell Blanks, MD;  Location: [place] CV LAB;  Service: Open Heart Surgery;  Laterality: Left;       Family History  Problem Relation Age of Onset  . CAD Mother     Social History   Tobacco Use  . Smoking status: Former Smoker    Packs/day: 0.50    Quit date: 03/23/2018    Years since quitting: 2.3  . Smokeless tobacco: Never Used  Vaping Use  . Vaping Use: Never used  Substance Use Topics  . Alcohol use: No  . Drug use: No    Home Medications Prior to Admission medications   Medication  Sig Start Date End Date Taking? Authorizing Provider  acetaminophen (TYLENOL) 500 MG tablet Take 1,000 mg by mouth every 6 (six) hours as needed for moderate pain or headache.    [provider]  aspirin EC 81 MG tablet Take 81 mg by mouth daily. Swallow whole.    [provider]  atorvastatin (LIPITOR) 10 MG tablet TAKE 1 TABLET BY MOUTH EVERY DAY Patient taking differently: Take 10 mg by mouth daily. 06/09/20   [name], MD  AURYXIA 1 GM 210 MG(Fe) tablet Take 420 mg by mouth 3 (three) times daily. 05/30/20   [provider]  B Complex-C-Folic Acid (DIALYVITE 284) 0.8 MG TABS Take 0.8 mg by mouth daily.  05/08/19   [provider]  blood glucose meter kit and supplies KIT Dispense based on patient and insurance preference. Use up to four times daily as directed. (FOR ICD-9 250.00, 250.01). For QAC - HS accuchecks. 03/30/18   Thurnell Lose, MD  brimonidine (ALPHAGAN) 0.15 % ophthalmic solution Place 1 drop into the right eye 3 (three) times daily.    [provider]  carvedilol (COREG) 3.125 MG tablet TAKE  1 TABLET(3.125 MG) BY MOUTH TWICE DAILY WITH A MEAL Patient taking differently: Take 3.125 mg by mouth 2 (two) times daily with a meal. 06/09/20   [name], MD  clopidogrel (PLAVIX) 75 MG tablet Take 1 tablet (75 mg total) by mouth daily with breakfast. 07/25/20   Marty Heck, MD  dorzolamide-timolol (COSOPT) 22.3-6.8 MG/ML ophthalmic solution Place 1 drop into the right eye 2 (two) times daily. 06/25/17   [provider]  gabapentin (NEURONTIN) 100 MG capsule Take 1 capsule (100 mg total) by mouth at bedtime. Patient taking differently: Take 100-200 mg by mouth See admin instructions. Take 200 mg daily on Tues, Thurs, and Sat. Take 100 mg daily on Mon, Wed, Fri, and Sun. 01/22/20   Minette Brine, FNP  hydrocortisone cream 1 % Apply 1 application topically 3 (three) times daily as needed for itching.    [provider]   hydroxypropyl methylcellulose / hypromellose (ISOPTO TEARS / GONIOVISC) 2.5 % ophthalmic solution Place 1 drop into the left eye as needed for dry eyes.    [provider]  isosorbide mononitrate (IMDUR) 30 MG 24 hr tablet TAKE 1 TABLET(30 MG) BY MOUTH DAILY Patient taking differently: Take 30 mg by mouth daily. 06/09/20   [name], MD  Lancets (ONETOUCH DELICA PLUS [id number]) [place]  03/31/18   [provider]  [facility] VERIO test strip USE AS DIRECTED TO TEST FOUR TIMES A DAY 07/31/18   [name], MD  oxyCODONE-acetaminophen (PERCOCET) 5-325 MG tablet Take 1 tablet by mouth every 6 (six) hours as needed. 08/02/20   Rhyne, Hulen Shouts, PA-C  pantoprazole (PROTONIX) 40 MG tablet Take 1 tablet (40 mg total) by mouth daily at 12 noon. 01/21/20   Eileen Stanford, PA-C  pilocarpine (PILOCAR) 4 % ophthalmic solution Place 1 drop into the right eye 4 (four) times daily.     [provider]  ROCKLATAN 0.02-0.005 % SOLN Place 1 drop into the right eye at bedtime. 06/20/20   [provider]    Allergies    Patient has no known allergies.  Review of Systems   Review of Systems  Physical Exam Updated Vital Signs BP 132/67   Pulse 66   Temp 97.9 F (36.6 C) (Oral)   Resp 11   Ht _0  (1.651 m)   Wt 73 kg   SpO2 96%   BMI 26.78 kg/m   Physical Exam Vitals and nursing note reviewed.  Constitutional:      General: He is not in acute distress.    Appearance: He is well-developed.  HENT:     Head: Normocephalic and atraumatic.  Eyes:     Conjunctiva/sclera: Conjunctivae normal.  Cardiovascular:     Rate and Rhythm: Normal rate and regular rhythm.  Pulmonary:     Effort: Pulmonary effort is normal. No respiratory distress.     Breath sounds: No stridor.  Abdominal:     General: There is no distension.  Musculoskeletal:     Comments: Though the patient has evidence for necrosis of the distal left foot he has appreciable pulses, capillary fill  proximally, and moves the toes and ankle in all dimensions appropriately.  Skin:    General: Skin is warm and dry.       Neurological:     Mental Status: He is alert and oriented to person, place, and time.      ED Results / Procedures / Treatments   Labs (all labs ordered are listed, but only abnormal  results are displayed) Labs Reviewed  BASIC METABOLIC PANEL - Abnormal; Notable for the following components:      Result Value   Glucose, Bld 193 (*)    BUN 49 (*)    Creatinine, Ser 9.47 (*)    GFR, Estimated 5 (*)    Anion gap 16 (*)    All other components within normal limits  CBC - Abnormal; Notable for the following components:   RBC 3.38 (*)    Hemoglobin 11.2 (*)    HCT 35.5 (*)    MCV 105.0 (*)    All other components within normal limits  URINALYSIS, ROUTINE W REFLEX MICROSCOPIC - Abnormal; Notable for the following components:   pH 9.0 (*)    Protein, ur 100 (*)    All other components within normal limits  CBG MONITORING, ED - Abnormal; Notable for the following components:   Glucose-Capillary 164 (*)    All other components within normal limits  RESP PANEL BY RT-PCR (FLU A&B, COVID) [id number]    EKG EKG Interpretation  Date/Time:  Wednesday August 03 2020 12:58:26 EDT Ventricular Rate:  76 PR Interval:  130 QRS Duration: 118 QT Interval:  405 QTC Calculation: 456 R Axis:   73 Text Interpretation: Sinus rhythm Probable left atrial enlargement Incomplete right bundle branch block Low voltage, precordial leads Confirmed by Carmin Muskrat [phone]) on 08/03/2020 2:09:56 PM   Radiology DG Ankle 2 Views Left  Result Date: 08/03/2020 CLINICAL DATA:  Fall EXAM: LEFT ANKLE - 2 VIEW COMPARISON:  None. FINDINGS: No acute bony abnormality. Specifically, no fracture, subluxation, or dislocation. Diffuse vascular calcifications. IMPRESSION: No acute bony abnormality. Electronically Signed   By: Rolm Baptise M.D.   On: 08/03/2020 15:19   DG Foot Complete Left  Result  Date: 08/03/2020 CLINICAL DATA:  Fall EXAM: LEFT FOOT - COMPLETE 3+ VIEW COMPARISON:  None. FINDINGS: No acute bony abnormality. Specifically, no fracture, subluxation, or dislocation. Degenerative changes in the midfoot and 1st MTP joint. Diffuse vascular calcifications. IMPRESSION: No acute bony abnormality. Electronically Signed   By: Rolm Baptise M.D.   On: 08/03/2020 15:19    Procedures Procedures   ED Course  I have reviewed the triage vital signs and the nursing notes.  Pertinent labs & imaging results that were available during my care of the patient were reviewed by me and considered in my medical decision making (see chart for details).  Update: Patient in no distress.  I discussed case with our social work Medical laboratory scientific officer, and chart review is clear that patient is preparing for likely rehab placement following possible amputation.  3:21 PM Labs reviewed, largely reassuring.  Given the patient's known vasculopathy, description of new left lower weakness, pain, I discussed the case with his vascular team, reviewed his chart.  Subsequently also discussed his case with her social work Medical laboratory scientific officer, it is clear that the patient is being considered by chronic care management for additional assistance.  Social work is seen and evaluated the patient, discussed his presentation with the patient's daughter.  Patient not currently a candidate for long-term placement from the ED, but may require this following his procedure.  The patient's scheduled visit for amputation consideration was actually yesterday.  This has been rescheduled for next week.  Here his findings are generally reassuring, without evidence for acute new pathology, bacteremia, sepsis, osteomyelitis, and with preserved distal pulses, though he does have some evidence of necrosis.  Patient seen and evaluated by his vascular surgeon, Dr. Carlis Abbott - we discussed  his case together.  Patient can safely follow-up as an outpatient.  MDM  Rules/Calculators/A&P MDM Number of Diagnoses or Management Options Fall, initial encounter: new, needed workup Weakness: new, needed workup   Amount and/or Complexity of Data Reviewed Clinical lab tests: ordered and reviewed Tests in the radiology section of CPT: ordered and reviewed Tests in the medicine section of CPT: ordered and reviewed Decide to obtain previous medical records or to obtain history from someone other than the patient: yes Review and summarize past medical records: yes Discuss the patient with other providers: yes Independent visualization of images, tracings, or specimens: yes  Risk of Complications, Morbidity, and/or Mortality Presenting problems: high Diagnostic procedures: high Management options: high  Critical Care Total time providing critical care: < 30 minutes  Patient Progress Patient progress: stable  Final Clinical Impression(s) / ED Diagnoses Final diagnoses:  Fall, initial encounter  Weakness     Carmin Muskrat, MD 08/03/20 1622    Carmin Muskrat, MD 08/03/20 1649

## 2020-08-03 NOTE — Social Work (Signed)
CSW spoke with daughter, Elijah White via phone @ [phone].  Daughter was able to clarify that there is no surgery scheduled for tomorrow, that there is a follow up with vascular surgeon next Tuesday to reevaluate Pt.    Daughter expressed concern with recent fall and Pt pain.   CSW met with Pt at bedside. Pt states that he lives alone.  That even with very little eyesight and poor mobility he feels safe at home.  Daughter does grocery shopping and provides meals.   EDP updated.

## 2020-08-03 NOTE — Discharge Instructions (Addendum)
It is very important that you follow-up with Dr. Carlis Abbott next week for ongoing monitoring of your blood flow to your left leg.  Do not hesitate to return here for concerning changes.  Otherwise be sure to keep that appointment and follow-up with your primary care physician as well.

## 2020-08-03 NOTE — Chronic Care Management (AMB) (Signed)
Chronic Care Management    Social Work Note  08/03/2020 Name: Elijah THERIEN MRN: [id number] DOB: 1940/10/28  YAACOV White is a 80 y.o. year old male who is a primary care patient of [name], MD. The CCM team was consulted to assist the patient with chronic disease management and/or care coordination needs related to: Level of Care Concerns.   Collaboration with ED CSW Sheilagh Harrington for case discussion in response to provider referral for social work chronic care management and care coordination services.   Consent to Services:  The patient was given information about Chronic Care Management services, agreed to services, and gave verbal consent prior to initiation of services.  Please see initial visit note for detailed documentation.   Patient agreed to services and consent obtained.   Assessment: Review of patient past medical history, allergies, medications, and health status, including review of relevant consultants reports was performed today as part of a comprehensive evaluation and provision of chronic care management and care coordination services.     SDOH (Social Determinants of Health) assessments and interventions performed:    Advanced Directives Status: Not addressed in this encounter.  CCM Care Plan  No Known Allergies  Outpatient Encounter Medications as of 08/03/2020  Medication Sig  . acetaminophen (TYLENOL) 500 MG tablet Take 1,000 mg by mouth every 6 (six) hours as needed for moderate pain or headache.  [name] aspirin EC 81 MG tablet Take 81 mg by mouth daily. Swallow whole.  [name] atorvastatin (LIPITOR) 10 MG tablet TAKE 1 TABLET BY MOUTH EVERY DAY (Patient taking differently: Take 10 mg by mouth daily.)  . AURYXIA 1 GM 210 MG(Fe) tablet Take 420 mg by mouth 3 (three) times daily.  . B Complex-C-Folic Acid (DIALYVITE 638) 0.8 MG TABS Take 0.8 mg by mouth daily.   . blood glucose meter kit and supplies KIT Dispense based on patient and insurance preference.  Use up to four times daily as directed. (FOR ICD-9 250.00, 250.01). For QAC - HS accuchecks.  . brimonidine (ALPHAGAN) 0.15 % ophthalmic solution Place 1 drop into the right eye 3 (three) times daily.  . carvedilol (COREG) 3.125 MG tablet TAKE 1 TABLET(3.125 MG) BY MOUTH TWICE DAILY WITH A MEAL (Patient taking differently: Take 3.125 mg by mouth 2 (two) times daily with a meal.)  . clopidogrel (PLAVIX) 75 MG tablet Take 1 tablet (75 mg total) by mouth daily with breakfast.  . dorzolamide-timolol (COSOPT) 22.3-6.8 MG/ML ophthalmic solution Place 1 drop into the right eye 2 (two) times daily.  [name] gabapentin (NEURONTIN) 100 MG capsule Take 1 capsule (100 mg total) by mouth at bedtime. (Patient taking differently: Take 100-200 mg by mouth See admin instructions. Take 200 mg daily on Tues, Thurs, and Sat. Take 100 mg daily on Mon, Wed, Fri, and Sun.)  . hydrocortisone cream 1 % Apply 1 application topically 3 (three) times daily as needed for itching.  . hydroxypropyl methylcellulose / hypromellose (ISOPTO TEARS / GONIOVISC) 2.5 % ophthalmic solution Place 1 drop into the left eye as needed for dry eyes.  . isosorbide mononitrate (IMDUR) 30 MG 24 hr tablet TAKE 1 TABLET(30 MG) BY MOUTH DAILY (Patient taking differently: Take 30 mg by mouth daily.)  . Lancets (ONETOUCH DELICA PLUS [id number]) MISC   . ONETOUCH VERIO test strip USE AS DIRECTED TO TEST FOUR TIMES A DAY  . oxyCODONE-acetaminophen (PERCOCET) 5-325 MG tablet Take 1 tablet by mouth every 6 (six) hours as needed.  . pantoprazole (PROTONIX) 40 MG tablet  Take 1 tablet (40 mg total) by mouth daily at 12 noon.  . pilocarpine (PILOCAR) 4 % ophthalmic solution Place 1 drop into the right eye 4 (four) times daily.   [name] ROCKLATAN 0.02-0.005 % SOLN Place 1 drop into the right eye at bedtime.   No facility-administered encounter medications on file as of 08/03/2020.    Patient Active Problem List   Diagnosis Date Noted  . 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  . Acute on  chronic diastolic heart failure ([place]) 01/19/2020  . 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 RBBB   . Near syncope 12/13/2019  . Severe aortic stenosis   . ESRD (end stage renal disease) ([place]) 11/26/2019  . Macrocytic anemia 11/26/2019  . Adrenal mass ([place]) 11/26/2019  . 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 ARF (acute renal failure) ([place]) 03/25/2018  . Vision loss of right eye 11/11/2017  . Bilateral pseudophakia 07/11/2017  . GERD (gastroesophageal reflux disease) 09/10/2016  . HLD (hyperlipidemia) 09/10/2016  . Essential hypertension 08/13/2016  . 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  . Type 2 diabetes mellitus with renal manifestations ([place]) 10/22/2006  . BPH (benign prostatic hyperplasia) 10/22/2006  . COLONIC POLYPS, HX OF 10/22/2006    Conditions to be addressed/monitored: DMII, ESRD and PAD; Level of care concerns  Care Plan : Social Work [facility] Care Plan  Updates made by [name] since 08/03/2020 12:00 AM    Problem: Home and Family Safety ([place])     Goal: Home and Family Safety Maintained   Start Date: 08/03/2020  Expected End Date: 09/02/2020  Priority: High  Note:   Current Barriers:  . Chronic disease management support and education needs related to DM, ESRD, and PAD   . High fall risk . Impaired Vision . Ongoing wound and pain to great toe and heel, concern toe amputation may be needed  Social Worker Clinical Goal(s):  . patient will work with SW to identify and address any acute and/or chronic care coordination needs related to the self health management of DM, ESRD, and PAD   . Patient and his daughter will work with ED Social Worker to discuss care management needs post discharge including possibility of placement  SW Interventions:  . Inter-disciplinary care team collaboration (see longitudinal plan of care) . Collaboration with [name], MD regarding development and update of comprehensive plan of care as evidenced by provider  attestation and co-signature . Inbound call received from patients daughter [name] who reports patient is on the way to the ED via EMS after a fall . Discussed the patient has a necrotic great toe that continues to be painful and keep patient awake at night . Performed chart review to note patient seen by Leontine Locket, PA with [facility] on 5.31.22 to evaluate great toe and heel - it was determined patient may need an amputation but would wait 1 week prior to making a decision . Patient has fallen x 2 in past 24 hours, [name] reports she requested patient be transported to [facility] for further work up of PAD . Discussed [name] does not feel the patient is safe in his home due to worsening vision and ongoing physical decline, she is concerned patient will continue to fall and injure himself . Rosamaria Lints, SW would outreach ED SW to request a consult regarding placement into SNF . Collaboration with Raina Mina, CSW to request a SW consult . Collaboration with Dr. [facility] and [place] to advise of intervention and plan 3:30  pm Update: . Inbound call received from evening [facility] ED CSW Sheilagh Harrington . Discussed patients daughter concerns with safety in the home . Reviewed the ED cannot place patients with custodial needs but can assist with patients needing placement for rehab . At this time it is not clear if patient will meet the requirements for rehab . Discussed plans for Vergie Living, CSW to contact the patients daughter to discuss patient care needs  Patient Goals/Self-Care Activities . patient will: with the help of his daughter  -  Engage with ED staff to evaluate treatment needs and discuss post-discharge plans  Follow Up Plan:  SW will follow up with the patient over the next 3 days       Follow Up Plan: SW will follow up with patient by phone over the next 3 days      [name], BSW, CDP Social Worker, Certified  Dementia Practitioner [place] [facility] [place] [facility] [phone]  Total time spent performing care coordination and/or care management activities with the patient by phone or face to face = 17 minutes.

## 2020-08-03 NOTE — Patient Instructions (Signed)
Social Worker Visit Information  Goals we discussed today:  Goals Addressed            This Visit's Progress   . Home and Family Safety Maintained       Timeframe:  Short-Term Goal Priority:  High Start Date:  6.1.22                           Expected End Date: 7.1.22                      Patient Goals/Self-Care Activities . patient will: with the help of his daughter  - Engage with ED staff to evaluate treatment needs and discuss post-discharge plans         Follow Up Plan: SW will follow up with patient by phone over the next 3 days   [name], BSW, CDP Social Worker, Certified Dementia Practitioner [place] [facility] [place] [facility] [phone]

## 2020-08-03 NOTE — Progress Notes (Signed)
80 year old male well-known to vascular surgery that presented to the ED today after a fall.  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  The procedure was done on 07/25/2020 with good results.  He was actually seen in the office yesterday for a triage visit and was having ongoing foot pain which was present preoperatively.  On exam in the ED he still has very brisk anterior tibial and dorsalis pedis Doppler flow and I think his stent is patent.  The foot is nice and warm.  Most of the pain has been in his heel as we discussed yesterday in the office, even though his tissue loss is not on the heel but on the toes themselves.    I discussed with him and his daughter about possible toe amputations versus TMA yesterday but discussed this would not address the heel pain.  Ultimately he wanted to delay any amputation and try conservative measures with pain control.  We gave him a refill of narcotics yesterday.  Ultimately he has follow-up with [place] next week and wants to continue thinking about his options. I did instruct his daughter to put Betadine paint on the toe ulcers.  The only way to surgically address his heel pain would be a below-knee amputation which I would like to avoid given excellent results with revascularization.  Marty Heck, MD [facility] [place] [facility] Office: [place]

## 2020-08-03 NOTE — Chronic Care Management (AMB) (Signed)
Chronic Care Management    Social Work Note  08/03/2020 Name: Elijah White MRN: [id number] DOB: [birth year]  Elijah White is a 80 y.o. year old male who is a primary care patient of Elijah Chard, MD. The CCM team was consulted to assist the patient with chronic disease management and/or care coordination needs related to: Level of Care Concerns.   Engaged with patient daughter Elijah White by phone for follow up visit in response to provider referral for social work chronic care management and care coordination services.   Consent to Services:  The patient was given information about Chronic Care Management services, agreed to services, and gave verbal consent prior to initiation of services.  Please see initial visit note for detailed documentation.   Patient agreed to services and consent obtained.   Assessment: Review of patient past medical history, allergies, medications, and health status, including review of relevant consultants reports was performed today as part of a comprehensive evaluation and provision of chronic care management and care coordination services.     SDOH (Social Determinants of Health) assessments and interventions performed:    Advanced Directives Status: Not addressed in this encounter.  CCM Care Plan  No Known Allergies  Outpatient Encounter Medications as of 08/03/2020  Medication Sig  . acetaminophen (TYLENOL) 500 MG tablet Take 1,000 mg by mouth every 6 (six) hours as needed for moderate pain or headache.  [name] aspirin EC 81 MG tablet Take 81 mg by mouth daily. Swallow whole.  [name] atorvastatin (LIPITOR) 10 MG tablet TAKE 1 TABLET BY MOUTH EVERY DAY (Patient taking differently: Take 10 mg by mouth daily.)  . AURYXIA 1 GM 210 MG(Fe) tablet Take 420 mg by mouth 3 (three) times daily.  . B Complex-C-Folic Acid (DIALYVITE 132) 0.8 MG TABS Take 0.8 mg by mouth daily.   . blood glucose meter kit and supplies KIT Dispense based on patient and insurance preference. Use  up to four times daily as directed. (FOR ICD-9 250.00, 250.01). For QAC - HS accuchecks.  . brimonidine (ALPHAGAN) 0.15 % ophthalmic solution Place 1 drop into the right eye 3 (three) times daily.  . carvedilol (COREG) 3.125 MG tablet TAKE 1 TABLET(3.125 MG) BY MOUTH TWICE DAILY WITH A MEAL (Patient taking differently: Take 3.125 mg by mouth 2 (two) times daily with a meal.)  . clopidogrel (PLAVIX) 75 MG tablet Take 1 tablet (75 mg total) by mouth daily with breakfast.  . dorzolamide-timolol (COSOPT) 22.3-6.8 MG/ML ophthalmic solution Place 1 drop into the right eye 2 (two) times daily.  [name] gabapentin (NEURONTIN) 100 MG capsule Take 1 capsule (100 mg total) by mouth at bedtime. (Patient taking differently: Take 100-200 mg by mouth See admin instructions. Take 200 mg daily on Tues, Thurs, and Sat. Take 100 mg daily on Mon, Wed, Fri, and Sun.)  . hydrocortisone cream 1 % Apply 1 application topically 3 (three) times daily as needed for itching.  . hydroxypropyl methylcellulose / hypromellose (ISOPTO TEARS / GONIOVISC) 2.5 % ophthalmic solution Place 1 drop into the left eye as needed for dry eyes.  . isosorbide mononitrate (IMDUR) 30 MG 24 hr tablet TAKE 1 TABLET(30 MG) BY MOUTH DAILY (Patient taking differently: Take 30 mg by mouth daily.)  . Lancets (ONETOUCH DELICA PLUS [id number]) MISC   . ONETOUCH VERIO test strip USE AS DIRECTED TO TEST FOUR TIMES A DAY  . oxyCODONE-acetaminophen (PERCOCET) 5-325 MG tablet Take 1 tablet by mouth every 6 (six) hours as needed.  . pantoprazole (PROTONIX) 40  MG tablet Take 1 tablet (40 mg total) by mouth daily at 12 noon.  . pilocarpine (PILOCAR) 4 % ophthalmic solution Place 1 drop into the right eye 4 (four) times daily.   [name] ROCKLATAN 0.02-0.005 % SOLN Place 1 drop into the right eye at bedtime.   No facility-administered encounter medications on file as of 08/03/2020.    Patient Active Problem List   Diagnosis Date Noted  . 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  . Acute on  chronic diastolic heart failure ([place]) 01/19/2020  . 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 RBBB   . Near syncope 12/13/2019  . Severe aortic stenosis   . ESRD (end stage renal disease) ([place]) 11/26/2019  . Macrocytic anemia 11/26/2019  . 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 ARF (acute renal failure) ([place]) 03/25/2018  . Vision loss of right eye 11/11/2017  . Bilateral pseudophakia 07/11/2017  . GERD (gastroesophageal reflux disease) 09/10/2016  . HLD (hyperlipidemia) 09/10/2016  . Essential hypertension 08/13/2016  . 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  . Type 2 diabetes mellitus with renal manifestations ([place]) 10/22/2006  . BPH (benign prostatic hyperplasia) 10/22/2006  . COLONIC POLYPS, HX OF 10/22/2006    Conditions to be addressed/monitored: DMII, ESRD and PAD; Level of care concerns  Care Plan : Social Work [facility] Care Plan  Updates made by [name] since 08/03/2020 12:00 AM    Problem: Home and Family Safety (Wellness)     Goal: Home and Family Safety Maintained   Start Date: 08/03/2020  Expected End Date: 09/02/2020  Priority: High  Note:   Current Barriers:  . Chronic disease management support and education needs related to DM, ESRD, and PAD   . High fall risk . Impaired Vision . Ongoing wound and pain to great toe and heel, concern toe amputation may be needed  Social Worker Clinical Goal(s):  . patient will work with SW to identify and address any acute and/or chronic care coordination needs related to the self health management of DM, ESRD, and PAD   . Patient and his daughter will work with ED Social Worker to discuss care management needs post discharge including possibility of placement  SW Interventions:  . Inter-disciplinary care team collaboration (see longitudinal plan of care) . Collaboration with Elijah Chard, MD regarding development and update of comprehensive plan of care as evidenced by provider  attestation and co-signature . Inbound call received from patients daughter Elijah White who reports patient is on the way to the ED via EMS after a fall . Discussed the patient has a necrotic great toe that continues to be painful and keep patient awake at night . Performed chart review to note patient seen by Leontine Locket, PA with [facility] on 5.31.22 to evaluate great toe and heel - it was determined patient may need an amputation but would wait 1 week prior to making a decision . Patient has fallen x 2 in past 24 hours, [name] reports she requested patient be transported to [facility] for further work up of PAD . Discussed Elijah White does not feel the patient is safe in his home due to worsening vision and ongoing physical decline, she is concerned patient will continue to fall and injure himself . Rosamaria Lints, SW would outreach ED SW to request a consult regarding placement into SNF . Collaboration with Raina Mina, CSW to request a SW consult . Collaboration with Dr. Baird Cancer and [place] to advise of intervention and  plan  Patient Goals/Self-Care Activities . patient will: with the help of his daughter  -  Engage with ED staff to evaluate treatment needs and discuss post-discharge plans  Follow Up Plan:  SW will follow up with the patient over the next 3 days       Follow Up Plan: SW will follow up with patient by phone over the next 3 days      [name], BSW, CDP Social Worker, Certified Dementia Practitioner [place] [facility] [place] [facility] [phone]  Total time spent performing care coordination and/or care management activities with the patient by phone or face to face = 40 minutes.

## 2020-08-04 ENCOUNTER — Ambulatory Visit: Payer: Medicare Other

## 2020-08-04 DIAGNOSIS — N2581 Secondary hyperparathyroidism of renal origin: Secondary | ICD-10-CM | POA: Diagnosis not present

## 2020-08-04 DIAGNOSIS — Z992 Dependence on renal dialysis: Secondary | ICD-10-CM | POA: Diagnosis not present

## 2020-08-04 DIAGNOSIS — E1122 Type 2 diabetes mellitus with diabetic chronic kidney disease: Secondary | ICD-10-CM

## 2020-08-04 DIAGNOSIS — N186 End stage renal disease: Secondary | ICD-10-CM | POA: Diagnosis not present

## 2020-08-04 NOTE — Chronic Care Management (AMB) (Signed)
Chronic Care Management    Social Work Note  08/04/2020 Name: Elijah White MRN: [id number] DOB: 1940/09/16  Elijah White is a 80 y.o. year old male who is a primary care patient of [name], MD. The CCM team was consulted to assist the patient with chronic disease management and/or care coordination needs related to: Level of Care Concerns.   Engaged with patient daughter by phone for follow up visit in response to provider referral for social work chronic care management and care coordination services.   Consent to Services:  The patient was given information about Chronic Care Management services, agreed to services, and gave verbal consent prior to initiation of services.  Please see initial visit note for detailed documentation.   Patient agreed to services and consent obtained.   Assessment: Review of patient past medical history, allergies, medications, and health status, including review of relevant consultants reports was performed today as part of a comprehensive evaluation and provision of chronic care management and care coordination services.     SDOH (Social Determinants of Health) assessments and interventions performed:    Advanced Directives Status: Not addressed in this encounter.  CCM Care Plan  No Known Allergies  Outpatient Encounter Medications as of 08/04/2020  Medication Sig  . acetaminophen (TYLENOL) 500 MG tablet Take 1,000 mg by mouth every 6 (six) hours as needed for moderate pain or headache.  [name] aspirin EC 81 MG tablet Take 81 mg by mouth daily. Swallow whole.  [name] atorvastatin (LIPITOR) 10 MG tablet TAKE 1 TABLET BY MOUTH EVERY DAY (Patient taking differently: Take 10 mg by mouth daily.)  . AURYXIA 1 GM 210 MG(Fe) tablet Take 420 mg by mouth 3 (three) times daily.  . B Complex-C-Folic Acid (DIALYVITE 301) 0.8 MG TABS Take 0.8 mg by mouth daily.   . blood glucose meter kit and supplies KIT Dispense based on patient and insurance preference. Use up to  four times daily as directed. (FOR ICD-9 250.00, 250.01). For QAC - HS accuchecks.  . brimonidine (ALPHAGAN) 0.15 % ophthalmic solution Place 1 drop into the right eye 3 (three) times daily.  . carvedilol (COREG) 3.125 MG tablet TAKE 1 TABLET(3.125 MG) BY MOUTH TWICE DAILY WITH A MEAL (Patient taking differently: Take 3.125 mg by mouth 2 (two) times daily with a meal.)  . clopidogrel (PLAVIX) 75 MG tablet Take 1 tablet (75 mg total) by mouth daily with breakfast.  . dorzolamide-timolol (COSOPT) 22.3-6.8 MG/ML ophthalmic solution Place 1 drop into the right eye 2 (two) times daily.  [name] gabapentin (NEURONTIN) 100 MG capsule Take 1 capsule (100 mg total) by mouth at bedtime. (Patient taking differently: Take 100-200 mg by mouth See admin instructions. Take 200 mg daily on Tues, Thurs, and Sat. Take 100 mg daily on Mon, Wed, Fri, and Sun.)  . hydrocortisone cream 1 % Apply 1 application topically 3 (three) times daily as needed for itching.  . hydroxypropyl methylcellulose / hypromellose (ISOPTO TEARS / GONIOVISC) 2.5 % ophthalmic solution Place 1 drop into the left eye as needed for dry eyes.  . isosorbide mononitrate (IMDUR) 30 MG 24 hr tablet TAKE 1 TABLET(30 MG) BY MOUTH DAILY (Patient taking differently: Take 30 mg by mouth daily.)  . Lancets (ONETOUCH DELICA PLUS [id number]) MISC   . ONETOUCH VERIO test strip USE AS DIRECTED TO TEST FOUR TIMES A DAY  . oxyCODONE-acetaminophen (PERCOCET) 5-325 MG tablet Take 1 tablet by mouth every 6 (six) hours as needed.  . pantoprazole (PROTONIX) 40 MG  tablet Take 1 tablet (40 mg total) by mouth daily at 12 noon.  . pilocarpine (PILOCAR) 4 % ophthalmic solution Place 1 drop into the right eye 4 (four) times daily.   [name] ROCKLATAN 0.02-0.005 % SOLN Place 1 drop into the right eye at bedtime.   No facility-administered encounter medications on file as of 08/04/2020.    Patient Active Problem List   Diagnosis Date Noted  . 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  . Acute on chronic  diastolic heart failure ([place]) 01/19/2020  . 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 RBBB   . Near syncope 12/13/2019  . Severe aortic stenosis   . ESRD (end stage renal disease) ([place]) 11/26/2019  . Macrocytic anemia 11/26/2019  . Adrenal mass ([place]) 11/26/2019  . 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 ARF (acute renal failure) ([place]) 03/25/2018  . Vision loss of right eye 11/11/2017  . Bilateral pseudophakia 07/11/2017  . GERD (gastroesophageal reflux disease) 09/10/2016  . HLD (hyperlipidemia) 09/10/2016  . Essential hypertension 08/13/2016  . 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  . Type 2 diabetes mellitus with renal manifestations ([place]) 10/22/2006  . BPH (benign prostatic hyperplasia) 10/22/2006  . COLONIC POLYPS, HX OF 10/22/2006    Conditions to be addressed/monitored: DMII, ESRD and PAD; Level of care concerns   Care Plan : Social Work [facility] Care Plan  Updates made by [name] since 08/04/2020 12:00 AM    Problem Identified: Home and Family Safety (Wellness)     Goal: Home and Family Safety Maintained   Start Date: 08/03/2020  Expected End Date: 09/02/2020  Priority: High  Note:   Current Barriers:  . Chronic disease management support and education needs related to DM, ESRD, and PAD   . High fall risk . Impaired Vision . Ongoing wound and pain to great toe and heel, concern toe amputation may be needed  Social Worker Clinical Goal(s):  . patient will work with SW to identify and address any acute and/or chronic care coordination needs related to the self health management of DM, ESRD, and PAD   . Patient and his daughter will work with ED Social Worker to discuss care management needs post discharge including possibility of placement Completed- patient discharged home due to no rehab needs . New 6.2.22- patient will work with his daughter to identify goals of care as instructed by SW  SW Interventions:  . Inter-disciplinary care team collaboration (see  longitudinal plan of care) . Collaboration with [name], MD regarding development and update of comprehensive plan of care as evidenced by provider attestation and co-signature . Successful outbound call placed to the patients daughter in response to voice message received . Discussed the patient was discharged from the ED and went to stay with his daughter overnight . This morning the patient is weak and having difficulty with standing . Determined [name] has decided to stay with the patient until transporting him to dialysis as she feels he is not safe - [name] s concerned the patient will not be strong enough to ambulate to her vehicle . Discussed options including placement or enrollment [facility] . Determined [name] is not interested in PACE due to having to switch primary care providers as well as the concern the patient would not have assistance overnight or on weekends - [name] reports she has upcomming business trips and does not have anyone else to assist with patient care needs . [name] inquired what would happen if the patients chose to stop dialysis as he  has mentioned that this past week . Discussed the process with a hospice referral as well as the difference with hospice in the home and the patient moving to [place] acknowledges she would prefer her dad to be at [facility] place if he were to stop dialysis . Reviewed process for placement including applying for long term care Medicaid - patient does not have many assets except a small life insurance policy . Instructed [name] she would need to cash this policy into a funeral home prior to placement to ensure access to assist with funeral planning . Assessed for [name]'s interest in SW or the patients primary provider having a goals of care conversation with the patient . Determined [name] will conduct this conversation on her own with the patient to discuss options including placement and/or Hospice services . Rosamaria Lints to contact SW directly once she determines the patients goals so SW may assist with next steps . Collaboration with Dr. Baird Cancer to advise patient may need placement - patient seen in clinic less than 30 days ago and does not need an OV for Fl2 to be completed . Collaboration with [place] to advise of interventions and plan 1:30pm Update . Successful outbound call placed to the patients daughter [name] in response to a voice message received . Discussed [name] and the patient discussed goals of care regarding placement versus remaining in the home . Determined the patient has yet to decide which option he feels works best for him - patient is at Dialysis now and [name] plans to check in with him later this evening . Encouraged [name] to contact the patients Medicaid caseworker to discuss switching from MQB Medicaid to Long term Care Medicaid if the patient is agreeable to placement . Assessed for need to begin completion of [place] requests SW wait to begin placement process as she wants to support her dads decision and would like to check in with him first . SW will follow up with [name] over the next week  Patient Goals/Self-Care Activities . patient will: with the help of his daughter  -  Contact SW to advise goals of care -Contact SW if placement is desired to begin process Financial controller Medicaid Caseworker regarding long term care Medicaid if placement is desired  Follow Up Plan:  SW will follow up with the patient over the next 7 days      Follow Up Plan: SW will follow up with patient by phone over the next 7 business days      [name], BSW, CDP Social Worker, Certified Dementia Practitioner [place] [facility] [place] [facility] [phone]  Total time spent performing care coordination and/or care management activities with the patient by phone or face to face = 105 minutes.

## 2020-08-04 NOTE — Patient Instructions (Signed)
Social Worker Visit Information  Goals we discussed today:  Goals Addressed            This Visit's Progress   . Home and Family Safety Maintained       Timeframe:  Short-Term Goal Priority:  High Start Date:  6.1.22                           Expected End Date: 7.1.22                      Patient Goals/Self-Care Activities . patient will: with the help of his daughter  - Contact SW to advise goals of care -Contact SW if placement is desired to begin process Financial controller Medicaid Caseworker regarding long term care Medicaid if placement is desired        Materials Provided: Verbal education about placement process provided by phone  Follow Up Plan: SW will follow up with patient by phone over the next week   [name], BSW, CDP Social Worker, Certified Dementia Practitioner [place] [facility] [place] [facility] [phone]

## 2020-08-05 ENCOUNTER — Other Ambulatory Visit: Payer: Self-pay | Admitting: Physician Assistant

## 2020-08-05 ENCOUNTER — Ambulatory Visit: Payer: Medicare Other

## 2020-08-05 DIAGNOSIS — N186 End stage renal disease: Secondary | ICD-10-CM

## 2020-08-05 DIAGNOSIS — E1122 Type 2 diabetes mellitus with diabetic chronic kidney disease: Secondary | ICD-10-CM

## 2020-08-05 NOTE — Patient Instructions (Signed)
Social Worker Visit Information  Goals we discussed today:  Goals Addressed            This Visit's Progress   . Home and Family Safety Maintained       Timeframe:  Short-Term Goal Priority:  High Start Date:  6.1.22                           Expected End Date: 7.1.22                      Patient Goals/Self-Care Activities . patient will: with the help of his daughter  - Contact SW to advise goals of care -Contact SW if placement is desired to begin process Financial controller Medicaid Caseworker regarding long term care Medicaid if placement is desired -Follow up with [facility] to discuss treatment plan to address great toe and heel pain        Follow Up Plan: SW will follow up with patient by phone over the next week  [name], BSW, CDP Social Worker, Certified Dementia Practitioner [place] [facility] [place] [phone]

## 2020-08-05 NOTE — Chronic Care Management (AMB) (Signed)
Chronic Care Management    Social Work Note  08/05/2020 Name: Elijah White MRN: [id number] DOB: 10-Apr-1940  Elijah White is a 80 y.o. year old male who is a primary care patient of Elijah Chard, MD. The CCM team was consulted to assist the patient with chronic disease management and/or care coordination needs related to: Level of Care Concerns.   Engaged with patient daughter Elijah White by phone for follow up visit in response to provider referral for social work chronic care management and care coordination services.   Consent to Services:  The patient was given information about Chronic Care Management services, agreed to services, and gave verbal consent prior to initiation of services.  Please see initial visit note for detailed documentation.   Patient agreed to services and consent obtained.   Assessment: Review of patient past medical history, allergies, medications, and health status, including review of relevant consultants reports was performed today as part of a comprehensive evaluation and provision of chronic care management and care coordination services.     SDOH (Social Determinants of Health) assessments and interventions performed:    Advanced Directives Status: Not addressed in this encounter.  CCM Care Plan  No Known Allergies  Outpatient Encounter Medications as of 08/05/2020  Medication Sig  . acetaminophen (TYLENOL) 500 MG tablet Take 1,000 mg by mouth every 6 (six) hours as needed for moderate pain or headache.  [name] aspirin EC 81 MG tablet Take 81 mg by mouth daily. Swallow whole.  [name] atorvastatin (LIPITOR) 10 MG tablet TAKE 1 TABLET BY MOUTH EVERY DAY (Patient taking differently: Take 10 mg by mouth daily.)  . AURYXIA 1 GM 210 MG(Fe) tablet Take 420 mg by mouth 3 (three) times daily.  . B Complex-C-Folic Acid (DIALYVITE 657) 0.8 MG TABS Take 0.8 mg by mouth daily.   . blood glucose meter kit and supplies KIT Dispense based on patient and insurance preference. Use  up to four times daily as directed. (FOR ICD-9 250.00, 250.01). For QAC - HS accuchecks.  . brimonidine (ALPHAGAN) 0.15 % ophthalmic solution Place 1 drop into the right eye 3 (three) times daily.  . carvedilol (COREG) 3.125 MG tablet TAKE 1 TABLET(3.125 MG) BY MOUTH TWICE DAILY WITH A MEAL (Patient taking differently: Take 3.125 mg by mouth 2 (two) times daily with a meal.)  . clopidogrel (PLAVIX) 75 MG tablet Take 1 tablet (75 mg total) by mouth daily with breakfast.  . dorzolamide-timolol (COSOPT) 22.3-6.8 MG/ML ophthalmic solution Place 1 drop into the right eye 2 (two) times daily.  [name] gabapentin (NEURONTIN) 100 MG capsule Take 1 capsule (100 mg total) by mouth at bedtime. (Patient taking differently: Take 100-200 mg by mouth See admin instructions. Take 200 mg daily on Tues, Thurs, and Sat. Take 100 mg daily on Mon, Wed, Fri, and Sun.)  . hydrocortisone cream 1 % Apply 1 application topically 3 (three) times daily as needed for itching.  . hydroxypropyl methylcellulose / hypromellose (ISOPTO TEARS / GONIOVISC) 2.5 % ophthalmic solution Place 1 drop into the left eye as needed for dry eyes.  . isosorbide mononitrate (IMDUR) 30 MG 24 hr tablet TAKE 1 TABLET(30 MG) BY MOUTH DAILY (Patient taking differently: Take 30 mg by mouth daily.)  . Lancets (ONETOUCH DELICA PLUS [id number]) MISC   . ONETOUCH VERIO test strip USE AS DIRECTED TO TEST FOUR TIMES A DAY  . oxyCODONE-acetaminophen (PERCOCET) 5-325 MG tablet Take 1 tablet by mouth every 6 (six) hours as needed.  . pantoprazole (PROTONIX) 40  MG tablet Take 1 tablet (40 mg total) by mouth daily at 12 noon.  . pilocarpine (PILOCAR) 4 % ophthalmic solution Place 1 drop into the right eye 4 (four) times daily.   [name] ROCKLATAN 0.02-0.005 % SOLN Place 1 drop into the right eye at bedtime.   No facility-administered encounter medications on file as of 08/05/2020.    Patient Active Problem List   Diagnosis Date Noted  . 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  . Acute on  chronic diastolic heart failure ([place]) 01/19/2020  . 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 RBBB   . Near syncope 12/13/2019  . Severe aortic stenosis   . ESRD (end stage renal disease) ([place]) 11/26/2019  . Macrocytic anemia 11/26/2019  . 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 ARF (acute renal failure) ([place]) 03/25/2018  . Vision loss of right eye 11/11/2017  . Bilateral pseudophakia 07/11/2017  . GERD (gastroesophageal reflux disease) 09/10/2016  . HLD (hyperlipidemia) 09/10/2016  . Essential hypertension 08/13/2016  . 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  . Type 2 diabetes mellitus with renal manifestations ([place]) 10/22/2006  . BPH (benign prostatic hyperplasia) 10/22/2006  . COLONIC POLYPS, HX OF 10/22/2006    Conditions to be addressed/monitored: DMII, ESRD and PAD; Level of care concerns  Care Plan : Social Work [facility] Care Plan  Updates made by Elijah White since 08/05/2020 12:00 AM    Problem: Home and Family Safety (Wellness)     Goal: Home and Family Safety Maintained   Start Date: 08/03/2020  Expected End Date: 09/02/2020  This Visit's Progress: On track  Priority: High  Note:   Current Barriers:  . Chronic disease management support and education needs related to DM, ESRD, and PAD   . High fall risk . Impaired Vision . Ongoing wound and pain to great toe and heel, concern toe amputation may be needed  Social Worker Clinical Goal(s):  . patient will work with SW to identify and address any acute and/or chronic care coordination needs related to the self health management of DM, ESRD, and PAD   . Patient and his daughter will work with ED Social Worker to discuss care management needs post discharge including possibility of placement Completed- patient discharged home due to no rehab needs . New 6.2.22- patient will work with his daughter to identify goals of care as instructed by SW . New 6.3.22- Patient will initiate the use  of a personal emergency response system once received by mail  SW Interventions:  . Inter-disciplinary care team collaboration (see longitudinal plan of care) . Collaboration with Elijah Chard, MD regarding development and update of comprehensive plan of care as evidenced by provider attestation and co-signature . Successful outbound call placed to the patients daughter in response to voice message received . Discussed the patient stayed with Elijah White overnight and is at home alone today while she is working  . Elijah White reports she feels the patient is safe at home as he is familiar with this environment and she is checking on him periodically between meetings . Assessed for patient possession of a personal emergency response system- Elijah White reports the patient has a button in his home but not one that is wearable. One has been ordered and is to be delivered by mail over the next few days. The patient does have a cell phone which he keeps with him in case he needs to call someone for help . Discussed the patient has a follow up Tuesday June 7th with  vascular and vein specialist - Elijah White reports she is unsure if the patient will want to proceed with amputation or not. He continues to complain of pain in his great toe and heel with more pain in the heel.  Elijah White reports concern with great toe amputation unless the patient may enter rehab due to patients heel pain and heightened fall risk  . Determined Elijah White will discuss concern on Tuesday in hopes of determining if the patient will be approved for skilled rehab post amputation of great toe  . Encouraged Elijah White to also discuss concerns with mobility on Tuesday to determine if patient would benefit from a wheelchair to be used with outings  . Determined Elijah White has left a voice message for the patients Medicaid caseworker to discuss possibility of patient needing placement - at this time Elijah White is unsure if she will proceed with placement and wants to see how  Tuesday's visit goes with vascular and vein specialist . Elijah White does report her cousin who checks on the patient on Monday's, Wednesday's and Friday's does plan to spend extra time in the home to increase patient safety . SW will follow up as planned on June 8  Patient Goals/Self-Care Activities . patient will: with the help of his daughter  -  Contact SW to advise goals of care -Contact SW if placement is desired to begin process Financial controller Medicaid Caseworker regarding long term care Medicaid if placement is desired -Follow up with Vascular and Vein Specialist to discuss treatment plan to address great toe and heel pain  Follow Up Plan:  SW will follow up with the patient over the next 7 days       Follow Up Plan: SW will follow up with patient by phone over the next week      Elijah White, BSW, CDP Social Worker, Certified Dementia Practitioner [place] [facility] [place] [facility] [phone]  Total time spent performing care coordination and/or care management activities with the patient by phone or face to face = 30 minutes.

## 2020-08-06 DIAGNOSIS — Z992 Dependence on renal dialysis: Secondary | ICD-10-CM | POA: Diagnosis not present

## 2020-08-06 DIAGNOSIS — N186 End stage renal disease: Secondary | ICD-10-CM | POA: Diagnosis not present

## 2020-08-06 DIAGNOSIS — N2581 Secondary hyperparathyroidism of renal origin: Secondary | ICD-10-CM | POA: Diagnosis not present

## 2020-08-08 ENCOUNTER — Telehealth: Payer: Self-pay

## 2020-08-08 NOTE — Telephone Encounter (Signed)
Transition Care Management Follow-up Telephone Call  Date of discharge and from where: 0601/2022 [place]  How have you been since you were released from the hospital? Not well following up with the vascular doctor tomorrow.  Any questions or concerns? No  Items Reviewed:  Did the pt receive and understand the discharge instructions provided? Yes   Medications obtained and verified? Yes   Other? No   Any new allergies since your discharge? No   Dietary orders reviewed? None given  Do you have support at home? No   Home Care and Equipment/Supplies: Were home health services ordered? Np If so, what is the name of the agency?  Has the agency set up a time to come to the patient's home?  Were any new equipment or medical supplies ordered?  No What is the name of the medical supply agency?  Were you able to get the supplies/equipment?  Do you have any questions related to the use of the equipment or supplies? No  Functional Questionnaire: (I = Independent and D = Dependent) ADLs: D  Bathing/Dressing- D  Meal Prep- D  Eating- D  Maintaining continence- D  Transferring/Ambulation- D  Managing Meds- D  Follow up appointments reviewed:   PCP Hospital f/u appt confirmed? No  the patient's daughter Elijah White said that the pt has an appt with the vascular doctor tomorrow and if he has to have his foot amputated the pt will be going to skilled nursing for rehab.  [facility] f/u appt confirmed?Yes  vascular 08/10/2020  Are transportation arrangements needed? No   If their condition worsens, is the pt aware to call PCP or go to the Emergency Dept.?yes  Was the patient provided with contact information for the PCP's office or ED? yes  Was to pt encouraged to call back with questions or concerns?

## 2020-08-09 ENCOUNTER — Other Ambulatory Visit: Payer: Self-pay

## 2020-08-09 ENCOUNTER — Ambulatory Visit (INDEPENDENT_AMBULATORY_CARE_PROVIDER_SITE_OTHER): Payer: Medicare Other | Admitting: Physician Assistant

## 2020-08-09 ENCOUNTER — Ambulatory Visit: Payer: Medicare Other

## 2020-08-09 VITALS — BP 163/75 | HR 83 | Temp 98.1°F | Resp 20 | Ht 65.0 in | Wt 161.0 lb

## 2020-08-09 DIAGNOSIS — I739 Peripheral vascular disease, unspecified: Secondary | ICD-10-CM

## 2020-08-09 DIAGNOSIS — N186 End stage renal disease: Secondary | ICD-10-CM

## 2020-08-09 DIAGNOSIS — I96 Gangrene, not elsewhere classified: Secondary | ICD-10-CM

## 2020-08-09 DIAGNOSIS — E1140 Type 2 diabetes mellitus with diabetic neuropathy, unspecified: Secondary | ICD-10-CM | POA: Diagnosis not present

## 2020-08-09 DIAGNOSIS — D509 Iron deficiency anemia, unspecified: Secondary | ICD-10-CM | POA: Diagnosis not present

## 2020-08-09 DIAGNOSIS — N2581 Secondary hyperparathyroidism of renal origin: Secondary | ICD-10-CM | POA: Diagnosis not present

## 2020-08-09 DIAGNOSIS — E1122 Type 2 diabetes mellitus with diabetic chronic kidney disease: Secondary | ICD-10-CM

## 2020-08-09 DIAGNOSIS — Z992 Dependence on renal dialysis: Secondary | ICD-10-CM | POA: Diagnosis not present

## 2020-08-09 NOTE — Chronic Care Management (AMB) (Signed)
Chronic Care Management    Social Work Note  08/09/2020 Name: Elijah White MRN: [id number] DOB: 1940-12-15  XAVIER MUNGER is a 80 y.o. year old male who is a primary care patient of [name], MD. The CCM team was consulted to assist the patient with chronic disease management and/or care coordination needs related to: Level of Care Concerns.   Engaged with patient daughter by phone for follow up visit in response to provider referral for social work chronic care management and care coordination services.   Consent to Services:  The patient was given information about Chronic Care Management services, agreed to services, and gave verbal consent prior to initiation of services.  Please see initial visit note for detailed documentation.   Patient agreed to services and consent obtained.   Assessment: Review of patient past medical history, allergies, medications, and health status, including review of relevant consultants reports was performed today as part of a comprehensive evaluation and provision of chronic care management and care coordination services.     SDOH (Social Determinants of Health) assessments and interventions performed:    Advanced Directives Status: Not addressed in this encounter.  CCM Care Plan  No Known Allergies  Outpatient Encounter Medications as of 08/09/2020  Medication Sig  . acetaminophen (TYLENOL) 500 MG tablet Take 1,000 mg by mouth every 6 (six) hours as needed for moderate pain or headache.  [name] aspirin EC 81 MG tablet Take 81 mg by mouth daily. Swallow whole.  [name] atorvastatin (LIPITOR) 10 MG tablet TAKE 1 TABLET BY MOUTH EVERY DAY (Patient taking differently: Take 10 mg by mouth daily.)  . AURYXIA 1 GM 210 MG(Fe) tablet Take 420 mg by mouth 3 (three) times daily.  . B Complex-C-Folic Acid (DIALYVITE 761) 0.8 MG TABS Take 0.8 mg by mouth daily.   . blood glucose meter kit and supplies KIT Dispense based on patient and insurance preference. Use up to  four times daily as directed. (FOR ICD-9 250.00, 250.01). For QAC - HS accuchecks.  . brimonidine (ALPHAGAN) 0.15 % ophthalmic solution Place 1 drop into the right eye 3 (three) times daily.  . carvedilol (COREG) 3.125 MG tablet TAKE 1 TABLET(3.125 MG) BY MOUTH TWICE DAILY WITH A MEAL (Patient taking differently: Take 3.125 mg by mouth 2 (two) times daily with a meal.)  . clopidogrel (PLAVIX) 75 MG tablet Take 1 tablet (75 mg total) by mouth daily with breakfast.  . dorzolamide-timolol (COSOPT) 22.3-6.8 MG/ML ophthalmic solution Place 1 drop into the right eye 2 (two) times daily.  [name] gabapentin (NEURONTIN) 100 MG capsule Take 1 capsule (100 mg total) by mouth at bedtime. (Patient taking differently: Take 100-200 mg by mouth See admin instructions. Take 200 mg daily on Tues, Thurs, and Sat. Take 100 mg daily on Mon, Wed, Fri, and Sun.)  . hydrocortisone cream 1 % Apply 1 application topically 3 (three) times daily as needed for itching.  . hydroxypropyl methylcellulose / hypromellose (ISOPTO TEARS / GONIOVISC) 2.5 % ophthalmic solution Place 1 drop into the left eye as needed for dry eyes.  . isosorbide mononitrate (IMDUR) 30 MG 24 hr tablet TAKE 1 TABLET(30 MG) BY MOUTH DAILY (Patient taking differently: Take 30 mg by mouth daily.)  . Lancets (ONETOUCH DELICA PLUS [id number]) MISC   . ONETOUCH VERIO test strip USE AS DIRECTED TO TEST FOUR TIMES A DAY  . oxyCODONE-acetaminophen (PERCOCET) 5-325 MG tablet Take 1 tablet by mouth every 6 (six) hours as needed.  . pantoprazole (PROTONIX) 40 MG  tablet TAKE 1 TABLET BY MOUTH DAILY AT NOON  . pilocarpine (PILOCAR) 4 % ophthalmic solution Place 1 drop into the right eye 4 (four) times daily.   [name] ROCKLATAN 0.02-0.005 % SOLN Place 1 drop into the right eye at bedtime.   No facility-administered encounter medications on file as of 08/09/2020.    Patient Active Problem List   Diagnosis Date Noted  . 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  . Acute on chronic diastolic heart  failure ([place]) 01/19/2020  . 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 RBBB   . Near syncope 12/13/2019  . Severe aortic stenosis   . ESRD (end stage renal disease) ([place]) 11/26/2019  . Macrocytic anemia 11/26/2019  . Adrenal mass ([place]) 11/26/2019  . 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 ARF (acute renal failure) ([place]) 03/25/2018  . Vision loss of right eye 11/11/2017  . Bilateral pseudophakia 07/11/2017  . GERD (gastroesophageal reflux disease) 09/10/2016  . HLD (hyperlipidemia) 09/10/2016  . Essential hypertension 08/13/2016  . 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  . Type 2 diabetes mellitus with renal manifestations ([place]) 10/22/2006  . BPH (benign prostatic hyperplasia) 10/22/2006  . COLONIC POLYPS, HX OF 10/22/2006    Conditions to be addressed/monitored: DMII, ESRD and PAD; Level of care concerns  Care Plan : Social Work [facility] Care Plan  Updates made by [name] since 08/09/2020 12:00 AM    Problem: Home and Family Safety (Wellness)     Goal: Home and Family Safety Maintained   Start Date: 08/03/2020  Expected End Date: 09/02/2020  Recent Progress: On track  Priority: High  Note:   Current Barriers:  . Chronic disease management support and education needs related to DM, ESRD, and PAD   . High fall risk . Impaired Vision . Ongoing wound and pain to great toe and heel, concern toe amputation may be needed  Social Worker Clinical Goal(s):  . patient will work with SW to identify and address any acute and/or chronic care coordination needs related to the self health management of DM, ESRD, and PAD   . Patient and his daughter will work with ED Social Worker to discuss care management needs post discharge including possibility of placement Completed- patient discharged home due to no rehab needs . New 6.2.22- patient will work with his daughter to identify goals of care as instructed by SW . New 6.3.22- Patient will initiate the use of a personal emergency  response system once received by mail  SW Interventions:  . Inter-disciplinary care team collaboration (see longitudinal plan of care) . Collaboration with [name], MD regarding development and update of comprehensive plan of care as evidenced by provider attestation and co-signature . Inbound call received from patient daughter [name] to update SW following Vascular and Vein follow up appointment  . Discussed the patient continues to have necrotic L great toe with paint in great toe and heel. [name] reports the physician discussed toe amputation but due to amount of pressure the heel would sustain during recovery and pain level the patient already is experiencing that was decided against . 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 Discussed [name]'s concern for patient to heal after this surgery but is supportive of his decision . Discussed patient will need long term care placement following surgery due to decreased mobility and independence. [name] reports the patient is no longer successfully feeding himself due to challenges with vision . Rosamaria Lints  the inpatient care management team will assist with placement into rehab post surgery - SW will assist with transition to long term care placement when warranted . SW will follow up with [name] over the next 10 days to assess patient disposition . Collaboration with patients primary care provider Dr. Baird Cancer and Barb Merino RN Care Manager to update on plan for BKA  Patient Goals/Self-Care Activities . patient will: with the help of his daughter  -  Contact SW to advise goals of care -Contact SW if placement is desired to begin process -Follow up with Vascular and Vein Specialist to discuss treatment plan to address great toe and heel pain  Follow Up Plan:  SW will follow up with the patient over the next 10 days       Follow Up Plan: SW will follow up  with patient by phone over the next 10 days      [place], CDP Social Worker, Certified Dementia Practitioner [place] [facility] [place] [facility] [phone]  Total time spent performing care coordination and/or care management activities with the patient by phone or face to face = 25 minutes.

## 2020-08-09 NOTE — Patient Instructions (Signed)
Social Worker Visit Information  Goals we discussed today:  Goals Addressed            This Visit's Progress   . Home and Family Safety Maintained       Timeframe:  Short-Term Goal Priority:  High Start Date:  6.1.22                           Expected End Date: 7.1.22     Next planned outreach 6.15.22                   Patient Goals/Self-Care Activities . patient will: with the help of his daughter  - Contact SW to advise goals of care -Contact SW if placement is desired to begin process -Follow up with Vascular and Vein Specialist to discuss treatment plan to address great toe and heel pain        Materials Provided: Verbal education about placement into LTC from rehab provided by phone  Follow Up Plan: SW will follow up with patient by phone over the next 10 days.   [name], BSW, CDP Social Worker, Certified Dementia Practitioner [place] [facility] [place] [facility] [phone]

## 2020-08-09 NOTE — Progress Notes (Signed)
Office Note     CC:  follow up Requesting Provider:  [name], MD  HPI: Elijah White is a 80 y.o. (1940/09/21) male who presents for follow-up visit due to rest pain of left foot. Recent arteriogram with intervention including stent and angioplasy of left SFA. Has chronic ischemic changes to toes as well as significant heel pain on the left.  He is diabetic with diabetic neuropathy.  He is compliant with asa, statin and Plavix. His daughter and son accompany him today. 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  He continues with significant left heel pain.   The pt is on a statin for cholesterol management.    The pt is on an aspirin.    Other AC:  Plavix The pt is on BB for hypertension.  The pt does have diabetes. 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 Anemia    low iron  . Arthritis   . Cancer [facility]) ?2006   prostate  . Colon polyps ~ 1993 and 2003   Dr [name], [facility].  08/2001 colonoscopy: tubular adenoma at cecum.    . 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 Elevated cholesterol with high triglycerides   . ESRD (end stage renal disease) ([place])    [facility]  . GERD (gastroesophageal reflux disease)   . Hypertension   . Legally blind in left eye, as defined in [place]    has pinpoint vision in right eye  . RBBB     Past Surgical History:  Procedure Laterality Date  . ABDOMINAL AORTOGRAM W/LOWER EXTREMITY N/A 07/25/2020   Procedure: ABDOMINAL AORTOGRAM W/LOWER EXTREMITY;  Surgeon: Marty Heck, MD;  Location: [place] [facility];  Service: Cardiovascular;  Laterality: N/A;  . AV FISTULA PLACEMENT Left 03/20/2018   Procedure: ARTERIOVENOUS (AV) FISTULA CREATION ARM;  Surgeon: [name], MD;  Location: [place];  Service: Vascular;  Laterality: Left;  . BASCILIC VEIN TRANSPOSITION Left 05/21/2018    Procedure: LEFT BASILIC VEIN FISTULA SECOND STAGE;  Surgeon: [name], MD;  Location: [place];  Service: Vascular;  Laterality: Left;  . COLONOSCOPY    . COLONOSCOPY WITH PROPOFOL N/A 11/30/2019   Procedure: COLONOSCOPY WITH PROPOFOL;  Surgeon: Ronnette Juniper, MD;  Location: [place];  Service: Gastroenterology;  Laterality: N/A;  . GLAUCOMA SURGERY  2019   multiple surgeries  . HEMOSTASIS CLIP PLACEMENT  11/30/2019   Procedure: HEMOSTASIS CLIP PLACEMENT;  Surgeon: Ronnette Juniper, MD;  Location: [place];  Service: Gastroenterology;;  . KNEE ARTHROSCOPY    . MULTIPLE EXTRACTIONS WITH ALVEOLOPLASTY N/A 12/30/2019   Procedure: MULTIPLE EXTRACTION WITH ALVEOLOPLASTY;  Surgeon: Charlaine Dalton, DMD;  Location: [place];  Service: Dentistry;  Laterality: N/A;  . PERIPHERAL VASCULAR INTERVENTION Left 07/25/2020   Procedure: PERIPHERAL VASCULAR INTERVENTION;  Surgeon: Marty Heck, MD;  Location: [place] [facility];  Service: Cardiovascular;  Laterality: Left;  superficial femoral  . POLYPECTOMY  11/30/2019   Procedure: POLYPECTOMY;  Surgeon: Ronnette Juniper, MD;  Location: [place];  Service: Gastroenterology;;  . PROSTATE SURGERY    . RIGHT/LEFT HEART CATH AND CORONARY ANGIOGRAPHY N/A 12/03/2019   Procedure: RIGHT/LEFT HEART CATH AND CORONARY ANGIOGRAPHY;  Surgeon: Burnell Blanks, MD;  Location: [place] [facility];  Service: Cardiovascular;  Laterality: N/A;  . SUBMUCOSAL TATTOO  INJECTION  11/30/2019   Procedure: SUBMUCOSAL TATTOO INJECTION;  Surgeon: Ronnette Juniper, MD;  Location: [facility];  Service: Gastroenterology;;  . TEE WITHOUT CARDIOVERSION N/A 01/19/2020   Procedure: TRANSESOPHAGEAL ECHOCARDIOGRAM (TEE);  Surgeon: Burnell Blanks, MD;  Location: [place] [facility];  Service: Open Heart Surgery;  Laterality: N/A;  . TRANSCATHETER AORTIC VALVE REPLACEMENT, TRANSFEMORAL Left 01/19/2020   Procedure: TRANSCATHETER AORTIC VALVE REPLACEMENT, LEFT  TRANSFEMORAL;  Surgeon: Burnell Blanks, MD;  Location: [place] [facility];  Service: Open Heart Surgery;  Laterality: Left;    Social History   Socioeconomic History  . Marital status: Widowed    Spouse name: Not on file  . Number of children: Not on file  . Years of education: Not on file  . Highest education level: Not on file  Occupational History  . Occupation: retired  Tobacco Use  . Smoking status: Former Smoker    Packs/day: 0.50    Quit date: 03/23/2018    Years since quitting: 2.3  . Smokeless tobacco: Never Used  Vaping Use  . Vaping Use: Never used  Substance and Sexual Activity  . Alcohol use: No  . Drug use: No  . Sexual activity: Not Currently  Other Topics Concern  . Not on file  Social History Narrative  . Not on file   Social Determinants of Health   Financial Resource Strain: Low Risk   . Difficulty of Paying Living Expenses: Not hard at all  Food Insecurity: No Food Insecurity  . Worried About Charity fundraiser in the Last Year: Never true  . Ran Out of Food in the Last Year: Never true  Transportation Needs: No Transportation Needs  . Lack of Transportation (Medical): No  . Lack of Transportation (Non-Medical): No  Physical Activity: Inactive  . Days of Exercise per Week: 0 days  . Minutes of Exercise per Session: 0 min  Stress: No Stress Concern Present  . Feeling of Stress : Not at all  Social Connections: Not on file  Intimate Partner Violence: Not on file   Family History  Problem Relation Age of Onset  . CAD Mother     Current Outpatient Medications  Medication Sig Dispense Refill  . acetaminophen (TYLENOL) 500 MG tablet Take 1,000 mg by mouth every 6 (six) hours as needed for moderate pain or headache.    [name] aspirin EC 81 MG tablet Take 81 mg by mouth daily. Swallow whole.    [name] atorvastatin (LIPITOR) 10 MG tablet TAKE 1 TABLET BY MOUTH EVERY DAY (Patient taking differently: Take 10 mg by mouth daily.) 90 tablet 0  . AURYXIA  1 GM 210 MG(Fe) tablet Take 420 mg by mouth 3 (three) times daily.    . B Complex-C-Folic Acid (DIALYVITE 267) 0.8 MG TABS Take 0.8 mg by mouth daily.     . blood glucose meter kit and supplies KIT Dispense based on patient and insurance preference. Use up to four times daily as directed. (FOR ICD-9 250.00, 250.01). For QAC - HS accuchecks. 1 each 1  . brimonidine (ALPHAGAN) 0.15 % ophthalmic solution Place 1 drop into the right eye 3 (three) times daily.    . carvedilol (COREG) 3.125 MG tablet TAKE 1 TABLET(3.125 MG) BY MOUTH TWICE DAILY WITH A MEAL (Patient taking differently: Take 3.125 mg by mouth 2 (two) times daily with a meal.) 180 tablet 1  . clopidogrel (PLAVIX) 75 MG tablet Take 1 tablet (75 mg total) by mouth daily with breakfast. 30 tablet  5  . dorzolamide-timolol (COSOPT) 22.3-6.8 MG/ML ophthalmic solution Place 1 drop into the right eye 2 (two) times daily.  11  . gabapentin (NEURONTIN) 100 MG capsule Take 1 capsule (100 mg total) by mouth at bedtime. (Patient taking differently: Take 100-200 mg by mouth See admin instructions. Take 200 mg daily on Tues, Thurs, and Sat. Take 100 mg daily on Mon, Wed, Fri, and Sun.) 90 capsule 1  . hydrocortisone cream 1 % Apply 1 application topically 3 (three) times daily as needed for itching. (Patient not taking: Reported on 08/08/2020)    . hydroxypropyl methylcellulose / hypromellose (ISOPTO TEARS / GONIOVISC) 2.5 % ophthalmic solution Place 1 drop into the left eye as needed for dry eyes.    . isosorbide mononitrate (IMDUR) 30 MG 24 hr tablet TAKE 1 TABLET(30 MG) BY MOUTH DAILY (Patient taking differently: Take 30 mg by mouth daily.) 90 tablet 1  . Lancets (ONETOUCH DELICA PLUS [id number]) MISC     . ONETOUCH VERIO test strip USE AS DIRECTED TO TEST FOUR TIMES A DAY 100 each 3  . oxyCODONE-acetaminophen (PERCOCET) 5-325 MG tablet Take 1 tablet by mouth every 6 (six) hours as needed. 20 tablet 0  . pantoprazole (PROTONIX) 40 MG tablet TAKE 1 TABLET BY  MOUTH DAILY AT NOON 90 tablet 0  . pilocarpine (PILOCAR) 4 % ophthalmic solution Place 1 drop into the right eye 4 (four) times daily.     [name] ROCKLATAN 0.02-0.005 % SOLN Place 1 drop into the right eye at bedtime.     No current facility-administered medications for this visit.    No Known Allergies   REVIEW OF SYSTEMS:   '[X]'$  denotes positive finding, $RemoveBeforeDEI'[ ]'[id number]$  denotes negative finding Cardiac  Comments:  Chest pain or chest pressure:    Shortness of breath upon exertion:    Short of breath when lying flat:    Irregular heart rhythm:        Vascular    Pain in calf, thigh, or hip brought on by ambulation:    Pain in feet at night that wakes you up from your sleep:  x   Blood clot in your veins:    Leg swelling:         Pulmonary    Oxygen at home:    Productive cough:     Wheezing:         Neurologic    Sudden weakness in arms or legs:     Sudden numbness in arms or legs:     Sudden onset of difficulty speaking or slurred speech:    Temporary loss of vision in one eye:     Problems with dizziness:         Gastrointestinal    Blood in stool:     Vomited blood:         Genitourinary    Burning when urinating:     Blood in urine:        Psychiatric    Major depression:         Hematologic    Bleeding problems:    Problems with blood clotting too easily:        Skin    Rashes or ulcers:        Constitutional    Fever or chills:      PHYSICAL EXAMINATION:  Vitals:   08/09/20 0901  Weight: 161 lb (73 kg)  Height: $Remove'5\' 5"'zRpIDXN$  (1.651 m)   General:  WDWN in NAD; vital signs documented  above Gait: Not observed HENT: WNL, normocephalic Pulmonary: normal non-labored breathing  Cardiac: regular HR, Skin: without rashes Vascular Exam/Pulses: biphasic DP and peroneal Doppler signals, monophasic PT Extremities: with ischemic changes, with Gangrene , without cellulitis; with open wounds of 1-2 toe webspace with foul odor Musculoskeletal: no muscle wasting or  atrophy  Neurologic: A&O X 3;  No focal weakness or paresthesias are detected Psychiatric:  The pt has Normal affect.          ASSESSMENT/PLAN:: 80 y.o. 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  Detailed discussion with Dr. Carlis Abbott, the patient, the patient's son and daughter regarding surgical options, likelihood of healing, need for short-term rehab and likely skilled nursing facility placement.  He explained a transmetatarsal amputation is recommended due to ischemic changes of the toes and forefoot, with tissue breakdown and foul odor.  It was clearly stated that this would not resolve his heel pain.  A below the knee amputation would be required to control heel pain and it was explained there is a 30 to 40% chance the BKA would not heal.  Either of these options would require rehab and likely SNF placement.  The other option the family discussed was to not pursue further surgical intervention.  The patient ultimately decided to proceed with left below the knee amputation.  We will make plans for this on Friday, June 10 with Dr. Carlis Abbott.   Barbie Banner, PA-C [facility] [phone]  Clinic MD:   Dr. Carlis Abbott

## 2020-08-10 ENCOUNTER — Telehealth: Payer: Medicare Other

## 2020-08-11 ENCOUNTER — Ambulatory Visit: Payer: Self-pay

## 2020-08-11 ENCOUNTER — Ambulatory Visit: Payer: Medicare Other

## 2020-08-11 ENCOUNTER — Telehealth: Payer: Medicare Other

## 2020-08-11 ENCOUNTER — Other Ambulatory Visit: Payer: Self-pay

## 2020-08-11 ENCOUNTER — Encounter (HOSPITAL_COMMUNITY): Payer: Self-pay | Admitting: Vascular Surgery

## 2020-08-11 ENCOUNTER — Other Ambulatory Visit (HOSPITAL_COMMUNITY)
Admission: RE | Admit: 2020-08-11 | Discharge: 2020-08-11 | Disposition: A | Discharge status: Home / Self Care | Payer: Medicare Other | Source: Ambulatory Visit | Attending: Vascular Surgery | Admitting: Vascular Surgery

## 2020-08-11 DIAGNOSIS — E7800 Pure hypercholesterolemia, unspecified: Secondary | ICD-10-CM | POA: Diagnosis not present

## 2020-08-11 DIAGNOSIS — Z7401 Bed confinement status: Secondary | ICD-10-CM | POA: Diagnosis not present

## 2020-08-11 DIAGNOSIS — Z01812 Encounter for preprocedural laboratory examination: Secondary | ICD-10-CM | POA: Insufficient documentation

## 2020-08-11 DIAGNOSIS — N2581 Secondary hyperparathyroidism of renal origin: Secondary | ICD-10-CM | POA: Diagnosis not present

## 2020-08-11 DIAGNOSIS — E1122 Type 2 diabetes mellitus with diabetic chronic kidney disease: Secondary | ICD-10-CM | POA: Diagnosis not present

## 2020-08-11 DIAGNOSIS — Z86010 Personal history of colonic polyps: Secondary | ICD-10-CM | POA: Diagnosis not present

## 2020-08-11 DIAGNOSIS — N186 End stage renal disease: Secondary | ICD-10-CM | POA: Diagnosis not present

## 2020-08-11 DIAGNOSIS — I350 Nonrheumatic aortic (valve) stenosis: Secondary | ICD-10-CM

## 2020-08-11 DIAGNOSIS — I4510 Unspecified right bundle-branch block: Secondary | ICD-10-CM | POA: Diagnosis not present

## 2020-08-11 DIAGNOSIS — Z743 Need for continuous supervision: Secondary | ICD-10-CM | POA: Diagnosis not present

## 2020-08-11 DIAGNOSIS — R531 Weakness: Secondary | ICD-10-CM | POA: Diagnosis not present

## 2020-08-11 DIAGNOSIS — I739 Peripheral vascular disease, unspecified: Secondary | ICD-10-CM | POA: Diagnosis not present

## 2020-08-11 DIAGNOSIS — I70222 Atherosclerosis of native arteries of extremities with rest pain, left leg: Secondary | ICD-10-CM | POA: Diagnosis not present

## 2020-08-11 DIAGNOSIS — Z20822 Contact with and (suspected) exposure to covid-19: Secondary | ICD-10-CM | POA: Insufficient documentation

## 2020-08-11 DIAGNOSIS — M6281 Muscle weakness (generalized): Secondary | ICD-10-CM | POA: Diagnosis not present

## 2020-08-11 DIAGNOSIS — Z952 Presence of prosthetic heart valve: Secondary | ICD-10-CM | POA: Diagnosis not present

## 2020-08-11 DIAGNOSIS — I953 Hypotension of hemodialysis: Secondary | ICD-10-CM | POA: Diagnosis not present

## 2020-08-11 DIAGNOSIS — K219 Gastro-esophageal reflux disease without esophagitis: Secondary | ICD-10-CM | POA: Diagnosis not present

## 2020-08-11 DIAGNOSIS — D509 Iron deficiency anemia, unspecified: Secondary | ICD-10-CM | POA: Diagnosis not present

## 2020-08-11 DIAGNOSIS — D631 Anemia in chronic kidney disease: Secondary | ICD-10-CM | POA: Diagnosis not present

## 2020-08-11 DIAGNOSIS — L089 Local infection of the skin and subcutaneous tissue, unspecified: Secondary | ICD-10-CM | POA: Diagnosis not present

## 2020-08-11 DIAGNOSIS — Z8249 Family history of ischemic heart disease and other diseases of the circulatory system: Secondary | ICD-10-CM | POA: Diagnosis not present

## 2020-08-11 DIAGNOSIS — I70269 Atherosclerosis of native arteries of extremities with gangrene, unspecified extremity: Secondary | ICD-10-CM | POA: Diagnosis not present

## 2020-08-11 DIAGNOSIS — Z4781 Encounter for orthopedic aftercare following surgical amputation: Secondary | ICD-10-CM | POA: Diagnosis not present

## 2020-08-11 DIAGNOSIS — R279 Unspecified lack of coordination: Secondary | ICD-10-CM | POA: Diagnosis not present

## 2020-08-11 DIAGNOSIS — E1152 Type 2 diabetes mellitus with diabetic peripheral angiopathy with gangrene: Secondary | ICD-10-CM | POA: Diagnosis not present

## 2020-08-11 DIAGNOSIS — Z992 Dependence on renal dialysis: Secondary | ICD-10-CM | POA: Diagnosis not present

## 2020-08-11 DIAGNOSIS — I5032 Chronic diastolic (congestive) heart failure: Secondary | ICD-10-CM | POA: Diagnosis not present

## 2020-08-11 DIAGNOSIS — Z89512 Acquired absence of left leg below knee: Secondary | ICD-10-CM | POA: Diagnosis not present

## 2020-08-11 DIAGNOSIS — E8889 Other specified metabolic disorders: Secondary | ICD-10-CM | POA: Diagnosis not present

## 2020-08-11 DIAGNOSIS — N250 Renal osteodystrophy: Secondary | ICD-10-CM | POA: Diagnosis not present

## 2020-08-11 DIAGNOSIS — G8918 Other acute postprocedural pain: Secondary | ICD-10-CM | POA: Diagnosis not present

## 2020-08-11 DIAGNOSIS — H401133 Primary open-angle glaucoma, bilateral, severe stage: Secondary | ICD-10-CM

## 2020-08-11 DIAGNOSIS — H548 Legal blindness, as defined in USA: Secondary | ICD-10-CM | POA: Diagnosis not present

## 2020-08-11 DIAGNOSIS — I70262 Atherosclerosis of native arteries of extremities with gangrene, left leg: Secondary | ICD-10-CM | POA: Diagnosis not present

## 2020-08-11 DIAGNOSIS — E1129 Type 2 diabetes mellitus with other diabetic kidney complication: Secondary | ICD-10-CM | POA: Diagnosis not present

## 2020-08-11 DIAGNOSIS — I10 Essential (primary) hypertension: Secondary | ICD-10-CM

## 2020-08-11 DIAGNOSIS — I120 Hypertensive chronic kidney disease with stage 5 chronic kidney disease or end stage renal disease: Secondary | ICD-10-CM | POA: Diagnosis not present

## 2020-08-11 DIAGNOSIS — Z87891 Personal history of nicotine dependence: Secondary | ICD-10-CM | POA: Diagnosis not present

## 2020-08-11 DIAGNOSIS — L97929 Non-pressure chronic ulcer of unspecified part of left lower leg with unspecified severity: Secondary | ICD-10-CM | POA: Diagnosis not present

## 2020-08-11 DIAGNOSIS — D62 Acute posthemorrhagic anemia: Secondary | ICD-10-CM | POA: Diagnosis not present

## 2020-08-11 DIAGNOSIS — R2681 Unsteadiness on feet: Secondary | ICD-10-CM | POA: Diagnosis not present

## 2020-08-11 DIAGNOSIS — G546 Phantom limb syndrome with pain: Secondary | ICD-10-CM | POA: Diagnosis not present

## 2020-08-11 DIAGNOSIS — Z7902 Long term (current) use of antithrombotics/antiplatelets: Secondary | ICD-10-CM | POA: Diagnosis not present

## 2020-08-11 DIAGNOSIS — Z7982 Long term (current) use of aspirin: Secondary | ICD-10-CM | POA: Diagnosis not present

## 2020-08-11 DIAGNOSIS — E1140 Type 2 diabetes mellitus with diabetic neuropathy, unspecified: Secondary | ICD-10-CM | POA: Diagnosis not present

## 2020-08-11 LAB — SARS CORONAVIRUS 2 (TAT 6-24 HRS): SARS Coronavirus 2: NEGATIVE

## 2020-08-11 NOTE — Progress Notes (Signed)
I spoke to Elijah White, [name] daughter, Mr. Elijah White asked that we speak to Elijah White.  Mr. Elijah White has not had any s/s of Covid and has not had any exposure to Elijah White's knowledge.  Mr. Elijah White is Legally Blind.

## 2020-08-11 NOTE — Anesthesia Preprocedure Evaluation (Addendum)
Anesthesia Evaluation  Patient identified by MRN, date of birth, ID band Patient awake    Reviewed: Allergy & Precautions, NPO status , Patient's Chart, lab work & pertinent test results, reviewed documented beta blocker date and time   Airway Mallampati: II  TM Distance: >3 FB Neck ROM: Full    Dental  (+) Poor Dentition, Dental Advisory Given, Missing, Edentulous Upper,    Pulmonary former smoker,  Quit smoking 2020    Pulmonary exam normal breath sounds clear to auscultation       Cardiovascular hypertension, Pt. on medications and Pt. on home beta blockers + Peripheral Vascular Disease (plavix, aspirin)  Normal cardiovascular exam+ dysrhythmias + Valvular Problems/Murmurs (TAVR 2021)  Rhythm:Regular Rate:Normal   EKG: 08/03/20: Sinus rhythm Probable left atrial enlargement Incomplete right bundle branch block Low voltage, precordial leads Confirmed by Carmin Muskrat [phone]) on 08/03/2020 2:09:56 PM   CV: Echo 03/02/20: IMPRESSIONS  1. Left ventricular ejection fraction, by estimation, is 60 to 65%. The  left ventricle has normal function. The left ventricle has no regional  wall motion abnormalities. Left ventricular diastolic parameters are  indeterminate.  2. Right ventricular systolic function is normal. The right ventricular  size is normal.  3. The mitral valve is degenerative. Trivial mitral valve regurgitation.  Severe mitral annular calcification.  4. No Paravalular leak noted; Acceleration time < 100 ms; DVI 0.76. The  aortic valve has been repaired/replaced. Aortic valve regurgitation is not  visualized. There is a 26 mm Sapien prosthetic (TAVR) valve present in the  aortic position. Procedure  Date: 01/19/2020.  5. The inferior vena cava is normal in size with greater than 50%  respiratory variability, suggesting right atrial pressure of 3 mmHg.  - Comparison(s): A prior study was performed on  01/20/2020. Stable  prosthetic valve measurements from prior study.   Long term event monitor 01/10/20-02/03/20:  Impression: 1. Brief atrial tachycardia episodes (6 episodes in 14 days; longest 10.8 seconds). 2. No symptoms reported. 3. Rare ectopy.   Cardiac cath (pre-TAVR) 12/03/19: ? Prox RCA to Mid RCA lesion is 20% stenosed. ? Dist RCA lesion is 20% stenosed. ? RPAV lesion is 40% stenosed. ? Mid Cx lesion is 20% stenosed. ? Prox LAD lesion is 30% stenosed.  1. Mild non-obstructive CAD 2. Severe aortic stenosis by echo (cath mean gradient 26.8 mmHg0 - S/p TAVR 01/19/20   Neuro/Psych negative neurological ROS  negative psych ROS   GI/Hepatic Neg liver ROS, GERD  Medicated and Controlled,  Endo/Other  diabetes, Well Controlled, Type 2  Renal/GU ESRF and DialysisRenal disease (HD T/Th/Sat)Last HD yesterday, K this AM 3.6  negative genitourinary   Musculoskeletal  (+) Arthritis , Osteoarthritis,  L critical limb ischemia    Abdominal   Peds  Hematology  (+) Blood dyscrasia (iron deficiency anemia ), anemia , hct 35.5   Anesthesia Other Findings legally blind L eye   Reproductive/Obstetrics negative OB ROS                           Anesthesia Physical Anesthesia Plan  ASA: 3  Anesthesia Plan: General and Regional   Post-op Pain Management: GA combined w/ Regional for post-op pain   Induction: Intravenous  PONV Risk Score and Plan: 2 and Ondansetron, Dexamethasone, Midazolam and Treatment may vary due to age or medical condition  Airway Management Planned: LMA  Additional Equipment: None  Intra-op Plan:   Post-operative Plan: Extubation in OR  Informed Consent: I have  reviewed the patients History and Physical, chart, labs and discussed the procedure including the risks, benefits and alternatives for the proposed anesthesia with the patient or authorized representative who has indicated his/her understanding and acceptance.      Dental advisory given  Plan Discussed with: CRNA  Anesthesia Plan Comments:       Anesthesia Quick Evaluation

## 2020-08-11 NOTE — Patient Instructions (Signed)
Social Worker Visit Information  Goals we discussed today:   Goals Addressed             This Visit's Progress    Home and Family Safety Maintained       Timeframe:  Short-Term Goal Priority:  High Start Date:  6.1.22                           Expected End Date: 7.1.22     Next planned outreach 6.15.22                   Patient Goals/Self-Care Activities patient will: with the help of his daughter  - Contact SW to advise goals of care -Contact SW if placement is desired to begin process -Review resources provided by SW -Follow up with Vascular and Vein Specialist to discuss treatment plan to address great toe and heel pain          Materials Provided: Yes: provided resource information via e-mail as requested  Follow Up Plan: SW will follow up with patient by phone over the next 14 days  [name], BSW, CDP Social Worker, Certified Dementia Practitioner [place] [facility] [place] [phone]

## 2020-08-11 NOTE — Chronic Care Management (AMB) (Signed)
Chronic Care Management   CCM RN Visit Note  08/11/2020 Name: SELDON White MRN: [id number] DOB: Jan 01, 1941  Subjective: Elijah White is a 80 y.o. year old male who is a primary care patient of [name], MD. The care management team was consulted for assistance with disease management and care coordination needs.    Engaged with patient by telephone for follow up visit in response to provider referral for case management and/or care coordination services.   Consent to Services:  The patient was given information about Chronic Care Management services, agreed to services, and gave verbal consent prior to initiation of services.  Please see initial visit note for detailed documentation.   Patient agreed to services and verbal consent obtained.   Assessment: Review of patient past medical history, allergies, medications, health status, including review of consultants reports, laboratory and other test data, was performed as part of comprehensive evaluation and provision of chronic care management services.   SDOH (Social Determinants of Health) assessments and interventions performed:   CCM Care Plan  No Known Allergies  Outpatient Encounter Medications as of 08/11/2020  Medication Sig   acetaminophen (TYLENOL) 500 MG tablet Take 1,000 mg by mouth every 6 (six) hours as needed for moderate pain or headache.   aspirin EC 81 MG tablet Take 81 mg by mouth in the morning. Swallow whole.   atorvastatin (LIPITOR) 10 MG tablet TAKE 1 TABLET BY MOUTH EVERY DAY (Patient taking differently: Take 10 mg by mouth in the morning.)   AURYXIA 1 GM 210 MG(Fe) tablet Take 420 mg by mouth 3 (three) times daily with meals.   B Complex-C-Folic Acid (DIALYVITE 224) 0.8 MG TABS Take 1 tablet by mouth in the morning.   blood glucose meter kit and supplies KIT Dispense based on patient and insurance preference. Use up to four times daily as directed. (FOR ICD-9 250.00, 250.01). For QAC - HS accuchecks.    brimonidine (ALPHAGAN) 0.15 % ophthalmic solution Place 1 drop into the right eye 3 (three) times daily.   carvedilol (COREG) 3.125 MG tablet TAKE 1 TABLET(3.125 MG) BY MOUTH TWICE DAILY WITH A MEAL (Patient taking differently: Take 3.125 mg by mouth 2 (two) times daily with a meal.)   clopidogrel (PLAVIX) 75 MG tablet Take 1 tablet (75 mg total) by mouth daily with breakfast.   dorzolamide-timolol (COSOPT) 22.3-6.8 MG/ML ophthalmic solution Place 1 drop into the right eye 2 (two) times daily.   gabapentin (NEURONTIN) 100 MG capsule Take 1 capsule (100 mg total) by mouth at bedtime. (Patient taking differently: Take 100-200 mg by mouth See admin instructions. Take 2 capsules (200 mg) by mouth daily at bedtime on Tues, Thurs, and Sat. Take 1 capsule (100 mg) by mouth daily at bedtime daily on Mon, Wed, Fri, and Sun.)   hydrocortisone cream 1 % Apply 1 application topically 3 (three) times daily as needed for itching.   hydroxypropyl methylcellulose / hypromellose (ISOPTO TEARS / GONIOVISC) 2.5 % ophthalmic solution Place 1 drop into the left eye as needed for dry eyes.   isosorbide mononitrate (IMDUR) 30 MG 24 hr tablet TAKE 1 TABLET(30 MG) BY MOUTH DAILY (Patient taking differently: Take 30 mg by mouth in the morning.)   Lancets (ONETOUCH DELICA PLUS [id number]) MISC    ONETOUCH VERIO test strip USE AS DIRECTED TO TEST FOUR TIMES A DAY   oxyCODONE-acetaminophen (PERCOCET) 5-325 MG tablet Take 1 tablet by mouth every 6 (six) hours as needed. (Patient taking differently: Take 1 tablet by  mouth every 6 (six) hours as needed (pain).)   pantoprazole (PROTONIX) 40 MG tablet TAKE 1 TABLET BY MOUTH DAILY AT NOON (Patient taking differently: Take 40 mg by mouth daily at 12 noon.)   pilocarpine (PILOCAR) 4 % ophthalmic solution Place 1 drop into the right eye 4 (four) times daily.    ROCKLATAN 0.02-0.005 % SOLN Place 1 drop into the right eye at bedtime.   No facility-administered encounter medications on file  as of 08/11/2020.    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/2019   Colonic mass 11/26/2019   ARF (acute renal failure) ([place]) 03/25/2018   Vision loss of right eye 11/11/2017   Bilateral pseudophakia 07/11/2017   GERD (gastroesophageal reflux disease) 09/10/2016   HLD (hyperlipidemia) 09/10/2016   Essential hypertension 08/13/2016   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   Type 2 diabetes mellitus with renal manifestations ([place]) 10/22/2006   BPH (benign prostatic hyperplasia) 10/22/2006   COLONIC POLYPS, HX OF 10/22/2006    Conditions to be addressed/monitored: Acute on chronic diastolic CHF; DM type 2; Essential hypertension; ESRD on hemodialysis;    Care Plan : Diabetes Type 2 (Adult)  Updates made by [name], RN since 08/11/2020 12:00 AM     Problem: Disease Progression (Diabetes, Type 2)   Priority: High     Long-Range Goal: Disease Progression Prevented or Minimized   Start Date: 03/22/2020  Expected End Date: 03/22/2021  Recent Progress: On track  Priority: High  Note:   Objective:  Lab Results  Component Value Date   HGBA1C 7.1 (H) 01/14/2020   Lab Results  Component Value Date   CREATININE 9.47 (H) 08/03/2020   CREATININE 7.60 (H) 07/25/2020   CREATININE 4.52 (H) 06/24/2020   No results found for: EGFR Current Barriers:  Knowledge Deficits related to basic Diabetes pathophysiology and self care/management Knowledge Deficits related to medications used for management of diabetes Does not use cbg meter  Unable to independently self monitor cbg's Legally blind Case Manager Clinical Goal(s):  Collaboration with [name], MD  regarding development and update of comprehensive plan of care as evidenced by provider attestation and co-signature Inter-disciplinary care team collaboration (see longitudinal plan of care) Patient will demonstrate improved adherence to prescribed treatment plan for diabetes self care/management as evidenced by: patient will work with the CCM team to address blood glucose meter needs, improve disease education and understanding of how to manage his Diabetes Interventions:  08/11/20 completed successful call with patients daughter [name]   Determined patient has developed Diabetic Neuropathy with ischemic changes noted to his left forefoot and toes Discussed patient will undergo a below the knee amputation tomorrow, 08/12/20 by Dr. Monica Martinez, [facility]  Determined patient will be transferred to acute rehab post discharge with possible long term skilled placement Provided active listening to daughter [name] regarding her concerns for long term care  Collaborated with embedded BSW [name] requesting she provide daughter [name] resources requested regarding skilled nursing facilities to consider for acute rehab upon patient's discharge  Discussed the hospital SW and or CM will help coordinate this transfer when ready  Discussed plans with patient for ongoing  care management follow up and provided patient with direct contact information for care management team Patient Goals/Self-Care Activities Report for admission/surgery scheduled at [facility] on 08/12/20 for left BKA  Follow Up Plan: Telephone follow up appointment with care management team member scheduled for: 09/27/20    Plan:Telephone follow up appointment with care management team member scheduled for:  09/27/20  Barb Merino, RN, BSN, CCM Care Management Coordinator [facility]  Direct Phone: [phone]

## 2020-08-11 NOTE — Chronic Care Management (AMB) (Signed)
Chronic Care Management    Social Work Note  08/11/2020 Name: Elijah White MRN: [id number] DOB: [birth year]  Elijah White is a 80 y.o. year old male who is a primary care patient of Dorothyann Peng, MD. The CCM team was consulted to assist the patient with chronic disease management and/or care coordination needs related to: Level of Care Concerns.   Collaboration with Medical illustrator  for  resource sharing  in response to provider referral for social work chronic care management and care coordination services.   Consent to Services:  The patient was given information about Chronic Care Management services, agreed to services, and gave verbal consent prior to initiation of services.  Please see initial visit note for detailed documentation.   Patient agreed to services and consent obtained.   Assessment: Review of patient past medical history, allergies, medications, and health status, including review of relevant consultants reports was performed today as part of a comprehensive evaluation and provision of chronic care management and care coordination services.     SDOH (Social Determinants of Health) assessments and interventions performed:    Advanced Directives Status: Not addressed in this encounter.  CCM Care Plan  No Known Allergies  Outpatient Encounter Medications as of 08/11/2020  Medication Sig   acetaminophen (TYLENOL) 500 MG tablet Take 1,000 mg by mouth every 6 (six) hours as needed for moderate pain or headache.   aspirin EC 81 MG tablet Take 81 mg by mouth in the morning. Swallow whole.   atorvastatin (LIPITOR) 10 MG tablet TAKE 1 TABLET BY MOUTH EVERY DAY (Patient taking differently: Take 10 mg by mouth in the morning.)   AURYXIA 1 GM 210 MG(Fe) tablet Take 420 mg by mouth 3 (three) times daily with meals.   B Complex-C-Folic Acid (DIALYVITE 800) 0.8 MG TABS Take 1 tablet by mouth in the morning.   blood glucose meter kit and supplies KIT Dispense based on patient  and insurance preference. Use up to four times daily as directed. (FOR ICD-9 250.00, 250.01). For QAC - HS accuchecks.   brimonidine (ALPHAGAN) 0.15 % ophthalmic solution Place 1 drop into the right eye 3 (three) times daily.   carvedilol (COREG) 3.125 MG tablet TAKE 1 TABLET(3.125 MG) BY MOUTH TWICE DAILY WITH A MEAL (Patient taking differently: Take 3.125 mg by mouth 2 (two) times daily with a meal.)   clopidogrel (PLAVIX) 75 MG tablet Take 1 tablet (75 mg total) by mouth daily with breakfast.   dorzolamide-timolol (COSOPT) 22.3-6.8 MG/ML ophthalmic solution Place 1 drop into the right eye 2 (two) times daily.   gabapentin (NEURONTIN) 100 MG capsule Take 1 capsule (100 mg total) by mouth at bedtime. (Patient taking differently: Take 100-200 mg by mouth See admin instructions. Take 2 capsules (200 mg) by mouth daily at bedtime on Tues, Thurs, and Sat. Take 1 capsule (100 mg) by mouth daily at bedtime daily on Mon, Wed, Fri, and Sun.)   hydrocortisone cream 1 % Apply 1 application topically 3 (three) times daily as needed for itching.   hydroxypropyl methylcellulose / hypromellose (ISOPTO TEARS / GONIOVISC) 2.5 % ophthalmic solution Place 1 drop into the left eye as needed for dry eyes.   isosorbide mononitrate (IMDUR) 30 MG 24 hr tablet TAKE 1 TABLET(30 MG) BY MOUTH DAILY (Patient taking differently: Take 30 mg by mouth in the morning.)   Lancets (ONETOUCH DELICA PLUS LANCET33G) MISC    ONETOUCH VERIO test strip USE AS DIRECTED TO TEST FOUR TIMES A DAY  oxyCODONE-acetaminophen (PERCOCET) 5-325 MG tablet Take 1 tablet by mouth every 6 (six) hours as needed. (Patient taking differently: Take 1 tablet by mouth every 6 (six) hours as needed (pain).)   pantoprazole (PROTONIX) 40 MG tablet TAKE 1 TABLET BY MOUTH DAILY AT NOON (Patient taking differently: Take 40 mg by mouth daily at 12 noon.)   pilocarpine (PILOCAR) 4 % ophthalmic solution Place 1 drop into the right eye 4 (four) times daily.    ROCKLATAN  0.02-0.005 % SOLN Place 1 drop into the right eye at bedtime.   No facility-administered encounter medications on file as of 08/11/2020.    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   Acute on chronic diastolic heart failure ([place]) 01/19/2020   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   RBBB    Near syncope 12/13/2019   Severe aortic stenosis    ESRD (end stage renal disease) ([place]) 11/26/2019   Macrocytic anemia 11/26/2019   Adrenal mass ([place]) 11/26/2019   Colonic mass 11/26/2019   ARF (acute renal failure) ([place]) 03/25/2018   Vision loss of right eye 11/11/2017   Bilateral pseudophakia 07/11/2017   GERD (gastroesophageal reflux disease) 09/10/2016   HLD (hyperlipidemia) 09/10/2016   Essential hypertension 08/13/2016   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   Type 2 diabetes mellitus with renal manifestations ([place]) 10/22/2006   BPH (benign prostatic hyperplasia) 10/22/2006   COLONIC POLYPS, HX OF 10/22/2006    Conditions to be addressed/monitored: DMII and ESRD; Level of care concerns  Care Plan : Social Work [place]  Updates made by [name] since 08/11/2020 12:00 AM     Problem: Home and Family Safety (Wellness)      Goal: Home and Family Safety Maintained   Start Date: 08/03/2020  Expected End Date: 09/02/2020  Recent Progress: On track  Priority: High  Note:   Current Barriers:  Chronic disease management support and education needs related to DM, ESRD, and PAD   High fall risk Impaired Vision Ongoing wound and pain to great toe and heel, concern toe amputation may be needed  Social Worker Clinical Goal(s):  patient will work with SW to identify and address any acute and/or chronic care coordination needs related to the self health management of DM, ESRD, and PAD   Patient and his daughter will work with ED Social Worker to discuss care management needs post discharge including possibility of placement  Completed- patient discharged home due to no rehab needs New 6.2.22- patient will work with his daughter to identify goals of care as instructed by SW New 6.3.22- Patient will initiate the use of a personal emergency response system once received by mail  SW Interventions:  Inter-disciplinary care team collaboration (see longitudinal plan of care) Collaboration with [name], MD regarding development and update of comprehensive plan of care as evidenced by provider attestation and co-signature Collaboration with [place] who indicates she has spoken with patients daughter [name]. [name] is requesting SW provide her with information on local rehab facilities by e-mail Compiled information requested including [facility], a list of common rehabs used by patients, link to review past complaint and inspection reports, link to Medicare.gov to learn more about each facility  Provided above resource information to [place] via e-mail as requested Patient Goals/Self-Care Activities patient will: with the help of his daughter  -  Contact SW to advise goals of care -Contact SW if placement is desired to begin process -  Review resources provided by SW -Follow up with Vascular and Vein Specialist to discuss treatment plan to address great toe and heel pain  Follow Up Plan:  SW will follow up with the patient over the next 10 days       Follow Up Plan: SW will follow up with patient by phone over the next 14 days      [name], BSW, CDP Social Worker, Certified Dementia Practitioner [place] [facility] [place] [facility] [phone]  Total time spent performing care coordination and/or care management activities with the patient by phone or face to face = 25 minutes.

## 2020-08-11 NOTE — Patient Instructions (Signed)
Goals Addressed      Monitor and Manage My Blood Sugar-Diabetes Type 2       Timeframe:  Long-Range Goal Priority:  High Start Date:  03/22/20                           Expected End Date: 03/22/21                     Follow Up Date: 09/27/20   Report for admission/surgery scheduled at [facility] on 08/12/20 for left BKA   Why is this important?   Checking your blood sugar at home helps to keep it from getting very high or very low.  Writing the results in a diary or log helps the doctor know how to care for you.  Your blood sugar log should have the time, date and the results.  Also, write down the amount of insulin or other medicine that you take.  Other information, like what you ate, exercise done and how you were feeling, will also be helpful.     Notes:

## 2020-08-11 NOTE — Progress Notes (Signed)
Case: [id number] Date/Time: 08/12/20 1027   Procedure: LEFT BELOW KNEE AMPUTATION (Left: Knee)   Anesthesia type: General   Pre-op diagnosis: CRITICAL LIMB ISCHEMIA, GANGRENE OF LEFT FIRST TOE   Location: [facility] OR ROOM 11 / [facility]   Surgeons: Marty Heck, MD       DISCUSSION: Patient is an 80 year old male scheduled for the above procedure.  History includes former smoker (quit 03/23/18), HTN, aortic stenosis (s/p 26 mm Edwards Sapien 3 THV TAVR 01/19/20), RBBB, ESRD (LUE Basilic vein transposition AVF 2020; started HD ~ 04/2019), DM2, legally blind, PAD (s/p left distal SFA stent, left behind the knee popliteal artery angioplasty 07/25/20), hypercholesterolemia, prostate cancer (s/p I-125 seed implant 05/18/05), anemia, GERD, glaucoma (s/p surgery 2019). 3 mm LUL pulmonary nodule on 12/01/19 CT with plans for follow-up CT 11/2020.  Patient to continue ASA and Plavix per VVS. 08/11/20 presurgical COVID-19 test in process. Anesthesia team to evaluate on the day of surgery. Labs as indicated on arrival.   VS:  BP Readings from Last 3 Encounters:  08/09/20 (!) 163/75  08/03/20 (!) 141/73  08/02/20 136/72   Pulse Readings from Last 3 Encounters:  08/09/20 83  08/03/20 67  08/02/20 81     PROVIDERS: [name], MD is PCP  Edrick Oh, MD is nephrologist Eleonore Chiquito, MD is cardiologist Lauree Chandler, MD & Gilford Raid, MD for TAVR   LABS: He has ESRD. He is for labs on the day of surgery as indicated. With CBC and BMET on 08/03/20, he may primarily need an ISTAT or per surgeon orders. Most recent lab results include: Lab Results  Component Value Date   WBC 8.0 08/03/2020   HGB 11.2 (L) 08/03/2020   HCT 35.5 (L) 08/03/2020   PLT 280 08/03/2020   GLUCOSE 193 (H) 08/03/2020   ALT 13 06/24/2020   AST 18 06/24/2020   NA 140 08/03/2020   K 5.1 08/03/2020   CL 100 08/03/2020   CREATININE 9.47 (H) 08/03/2020   BUN 49 (H) 08/03/2020   CO2 24 08/03/2020   TSH 0.820  11/26/2019   INR 1.0 01/14/2020   HGBA1C 7.1 (H) 01/14/2020     IMAGES: CTA Ao+BiFem 06/24/20: IMPRESSION: VASCULAR 1. Left popliteal atherosclerotic occlusion extending approximately 8 cm in the craniocaudal dimension with reconstitution distally. 2. Bilateral three-vessel runoff with intermittent occlusion of the leg arterial vasculature due to calcified and noncalcified plaque. 3.  Aortic Atherosclerosis (ICD10-I70.0). NON-VASCULAR 1. Cholelithiasis with no acute cholecystitis. 2. Bilateral atrophic kidneys. 3. 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 4. Prominent prostate with radiation seeds noted.  CXR 06/24/20: FINDINGS: Shallow inspiration. Heart size and pulmonary vascularity are normal. Cardiac valve prosthesis. Lungs are clear. No pleural effusions. No pneumothorax. Surgical clips in the left axilla. Degenerative changes in the spine and shoulders. IMPRESSION: No active cardiopulmonary disease.   EKG: 08/03/20: Sinus rhythm Probable left atrial enlargement Incomplete right bundle branch block Low voltage, precordial leads Confirmed by Carmin Muskrat [phone]) on 08/03/2020 2:09:56 PM   CV: Echo 03/02/20: IMPRESSIONS   1. Left ventricular ejection fraction, by estimation, is 60 to 65%. The  left ventricle has normal function. The left ventricle has no regional  wall motion abnormalities. Left ventricular diastolic parameters are  indeterminate.   2. Right ventricular systolic function is normal. The right ventricular  size is normal.   3. The mitral valve is degenerative. Trivial mitral valve regurgitation.  Severe mitral annular calcification.  4. No Paravalular leak noted; Acceleration time < 100 ms; DVI 0.76. The  aortic valve has been repaired/replaced. Aortic valve regurgitation is not  visualized. There is a 26 mm Sapien prosthetic (TAVR) valve present in the  aortic position. Procedure   Date: 01/19/2020.   5. The inferior vena cava is normal in size with greater than 50%  respiratory variability, suggesting right atrial pressure of 3 mmHg.  - Comparison(s): A prior study was performed on 01/20/2020. Stable  prosthetic valve measurements from prior study.    Long term event monitor 01/10/20-02/03/20:  Impression:  1. Brief atrial tachycardia episodes (6 episodes in 14 days; longest 10.8 seconds). 2. No symptoms reported. 3. Rare ectopy.   Cardiac cath (pre-TAVR) 12/03/19: Prox RCA to Mid RCA lesion is 20% stenosed. Dist RCA lesion is 20% stenosed. RPAV lesion is 40% stenosed. Mid Cx lesion is 20% stenosed. Prox LAD lesion is 30% stenosed.   1. Mild non-obstructive CAD 2. Severe aortic stenosis by echo (cath mean gradient 26.8 mmHg0 - S/p TAVR 01/19/20   Carotid US 12/02/19: Summary:  Right Carotid: Velocities in the right ICA are consistent with a 1-39%  stenosis.  Left Carotid: Velocities in the left ICA are consistent with a 1-39%  stenosis.  Vertebrals: Bilateral vertebral arteries demonstrate antegrade flow.    Past Medical History:  Diagnosis Date   Anemia    low iron   Aortic stenosis    s/p TAVR 01/19/20   Arthritis    Cancer [facility])    prostate, s/p I-125 seed implant 05/18/05   Colon polyps ~ 1993 and 2003   Dr [name], [facility].  08/2001 colonoscopy: tubular adenoma at cecum.     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   Elevated cholesterol with high triglycerides    ESRD (end stage renal disease) (HCC)    [facility]   GERD (gastroesophageal reflux disease)    Glaucoma    Hypertension    Legally blind in left eye, as defined in [place]    has pinpoint vision in right eye   PAD (peripheral artery disease) ([place])    RBBB     Past Surgical History:  Procedure Laterality Date   ABDOMINAL AORTOGRAM W/LOWER EXTREMITY N/A 07/25/2020   Procedure: ABDOMINAL AORTOGRAM W/LOWER EXTREMITY;  Surgeon: Marty Heck, MD;  Location: [place] CV LAB;  Service: Cardiovascular;  Laterality: N/A;   AV FISTULA PLACEMENT Left 03/20/2018   Procedure: ARTERIOVENOUS (AV) FISTULA CREATION ARM;  Surgeon: [name], MD;  Location: [place];  Service: Vascular;  Laterality: Left;   [place] Left 05/21/2018   Procedure: LEFT BASILIC VEIN FISTULA SECOND STAGE;  Surgeon: [name], MD;  Location: [place];  Service: Vascular;  Laterality: Left;   COLONOSCOPY     COLONOSCOPY WITH PROPOFOL N/A 11/30/2019   Procedure: COLONOSCOPY WITH PROPOFOL;  Surgeon: Ronnette Juniper, MD;  Location: [place];  Service: Gastroenterology;  Laterality: N/A;   GLAUCOMA SURGERY  2019   multiple surgeries   HEMOSTASIS CLIP PLACEMENT  11/30/2019   Procedure: HEMOSTASIS CLIP PLACEMENT;  Surgeon: Ronnette Juniper, MD;  Location[facility] ENDOSCOPY;  Service: Gastroenterology;;   KNEE ARTHROSCOPY     MULTIPLE EXTRACTIONS WITH ALVEOLOPLASTY N/A 12/30/2019   Procedure: MULTIPLE EXTRACTION WITH ALVEOLOPLASTY;  Surgeon: Charlaine Dalton, DMD;  Location: [place];  Service: Dentistry;  Laterality: N/A;   PERIPHERAL VASCULAR INTERVENTION Left 07/25/2020   Procedure: PERIPHERAL VASCULAR INTERVENTION;  Surgeon: Marty Heck, MD;  Location: [place] CV LAB;  Service: Cardiovascular;  Laterality: Left;  superficial femoral   POLYPECTOMY  11/30/2019   Procedure: POLYPECTOMY;  Surgeon: Ronnette Juniper, MD;  Location: [place];  Service: Gastroenterology;;   PROSTATE SURGERY     RIGHT/LEFT HEART CATH AND CORONARY ANGIOGRAPHY N/A 12/03/2019   Procedure: RIGHT/LEFT HEART CATH AND CORONARY ANGIOGRAPHY;  Surgeon: Burnell Blanks, MD;  Location: [place] CV LAB;  Service: Cardiovascular;  Laterality: N/A;   SUBMUCOSAL TATTOO INJECTION  11/30/2019   Procedure: SUBMUCOSAL TATTOO INJECTION;  Surgeon: Ronnette Juniper, MD;  Location: [place];  Service: Gastroenterology;;   TEE WITHOUT CARDIOVERSION N/A  01/19/2020   Procedure: TRANSESOPHAGEAL ECHOCARDIOGRAM (TEE);  Surgeon: Burnell Blanks, MD;  Location: [place] CV LAB;  Service: Open Heart Surgery;  Laterality: N/A;   TRANSCATHETER AORTIC VALVE REPLACEMENT, TRANSFEMORAL Left 01/19/2020   Procedure: TRANSCATHETER AORTIC VALVE REPLACEMENT, LEFT TRANSFEMORAL;  Surgeon: Burnell Blanks, MD;  Location: [place] [facility] LAB;  Service: Open Heart Surgery;  Laterality: Left;    MEDICATIONS: No current facility-administered medications for this encounter.    acetaminophen (TYLENOL) 500 MG tablet   aspirin EC 81 MG tablet   atorvastatin (LIPITOR) 10 MG tablet   AURYXIA 1 GM 210 MG(Fe) tablet   B Complex-C-Folic Acid (DIALYVITE 355) 0.8 MG TABS   brimonidine (ALPHAGAN) 0.15 % ophthalmic solution   carvedilol (COREG) 3.125 MG tablet   clopidogrel (PLAVIX) 75 MG tablet   dorzolamide-timolol (COSOPT) 22.3-6.8 MG/ML ophthalmic solution   gabapentin (NEURONTIN) 100 MG capsule   hydrocortisone cream 1 %   hydroxypropyl methylcellulose / hypromellose (ISOPTO TEARS / GONIOVISC) 2.5 % ophthalmic solution   isosorbide mononitrate (IMDUR) 30 MG 24 hr tablet   oxyCODONE-acetaminophen (PERCOCET) 5-325 MG tablet   pantoprazole (PROTONIX) 40 MG tablet   pilocarpine (PILOCAR) 4 % ophthalmic solution   ROCKLATAN 0.02-0.005 % SOLN   blood glucose meter kit and supplies KIT   Lancets (ONETOUCH DELICA PLUS [id number]) MISC   ONETOUCH VERIO test strip    Myra Gianotti, PA-C Surgical Short Stay/Anesthesiology [facility] Phone [phone] [facility] Phone [phone] 08/11/2020 4:06 PM

## 2020-08-12 ENCOUNTER — Inpatient Hospital Stay (HOSPITAL_COMMUNITY)
Admission: RE | Admit: 2020-08-12 | Discharge: 2020-08-19 | DRG: 239 | Disposition: A | Discharge status: Skilled Nursing Facility | Payer: Medicare Other | Attending: Vascular Surgery | Admitting: Vascular Surgery

## 2020-08-12 ENCOUNTER — Other Ambulatory Visit: Payer: Self-pay

## 2020-08-12 ENCOUNTER — Encounter (HOSPITAL_COMMUNITY): Payer: Self-pay | Admitting: Vascular Surgery

## 2020-08-12 ENCOUNTER — Ambulatory Visit: Payer: Medicare Other

## 2020-08-12 ENCOUNTER — Encounter (HOSPITAL_COMMUNITY)
Admission: RE | Disposition: A | Discharge status: Home / Self Care | Payer: Self-pay | Source: Home / Self Care | Attending: Vascular Surgery

## 2020-08-12 ENCOUNTER — Inpatient Hospital Stay (HOSPITAL_COMMUNITY): Payer: Medicare Other | Admitting: Vascular Surgery

## 2020-08-12 DIAGNOSIS — D631 Anemia in chronic kidney disease: Secondary | ICD-10-CM | POA: Diagnosis present

## 2020-08-12 DIAGNOSIS — Z20822 Contact with and (suspected) exposure to covid-19: Secondary | ICD-10-CM | POA: Diagnosis present

## 2020-08-12 DIAGNOSIS — E1140 Type 2 diabetes mellitus with diabetic neuropathy, unspecified: Secondary | ICD-10-CM

## 2020-08-12 DIAGNOSIS — Z86010 Personal history of colonic polyps: Secondary | ICD-10-CM

## 2020-08-12 DIAGNOSIS — I70262 Atherosclerosis of native arteries of extremities with gangrene, left leg: Secondary | ICD-10-CM | POA: Diagnosis present

## 2020-08-12 DIAGNOSIS — I120 Hypertensive chronic kidney disease with stage 5 chronic kidney disease or end stage renal disease: Secondary | ICD-10-CM | POA: Diagnosis present

## 2020-08-12 DIAGNOSIS — E1122 Type 2 diabetes mellitus with diabetic chronic kidney disease: Secondary | ICD-10-CM | POA: Diagnosis present

## 2020-08-12 DIAGNOSIS — R54 Age-related physical debility: Secondary | ICD-10-CM | POA: Diagnosis present

## 2020-08-12 DIAGNOSIS — N186 End stage renal disease: Secondary | ICD-10-CM | POA: Diagnosis present

## 2020-08-12 DIAGNOSIS — Z87891 Personal history of nicotine dependence: Secondary | ICD-10-CM

## 2020-08-12 DIAGNOSIS — I70269 Atherosclerosis of native arteries of extremities with gangrene, unspecified extremity: Secondary | ICD-10-CM | POA: Diagnosis present

## 2020-08-12 DIAGNOSIS — Z8249 Family history of ischemic heart disease and other diseases of the circulatory system: Secondary | ICD-10-CM

## 2020-08-12 DIAGNOSIS — K5900 Constipation, unspecified: Secondary | ICD-10-CM | POA: Diagnosis not present

## 2020-08-12 DIAGNOSIS — N2581 Secondary hyperparathyroidism of renal origin: Secondary | ICD-10-CM | POA: Diagnosis present

## 2020-08-12 DIAGNOSIS — E1152 Type 2 diabetes mellitus with diabetic peripheral angiopathy with gangrene: Principal | ICD-10-CM | POA: Diagnosis present

## 2020-08-12 DIAGNOSIS — G546 Phantom limb syndrome with pain: Secondary | ICD-10-CM | POA: Diagnosis not present

## 2020-08-12 DIAGNOSIS — D509 Iron deficiency anemia, unspecified: Secondary | ICD-10-CM | POA: Diagnosis present

## 2020-08-12 DIAGNOSIS — Z992 Dependence on renal dialysis: Secondary | ICD-10-CM | POA: Diagnosis not present

## 2020-08-12 DIAGNOSIS — I4510 Unspecified right bundle-branch block: Secondary | ICD-10-CM | POA: Diagnosis present

## 2020-08-12 DIAGNOSIS — E8889 Other specified metabolic disorders: Secondary | ICD-10-CM | POA: Diagnosis not present

## 2020-08-12 DIAGNOSIS — Z952 Presence of prosthetic heart valve: Secondary | ICD-10-CM | POA: Diagnosis not present

## 2020-08-12 DIAGNOSIS — D62 Acute posthemorrhagic anemia: Secondary | ICD-10-CM | POA: Diagnosis not present

## 2020-08-12 DIAGNOSIS — I70222 Atherosclerosis of native arteries of extremities with rest pain, left leg: Secondary | ICD-10-CM | POA: Diagnosis not present

## 2020-08-12 DIAGNOSIS — E781 Pure hyperglyceridemia: Secondary | ICD-10-CM | POA: Diagnosis present

## 2020-08-12 DIAGNOSIS — Z751 Person awaiting admission to adequate facility elsewhere: Secondary | ICD-10-CM

## 2020-08-12 DIAGNOSIS — I953 Hypotension of hemodialysis: Secondary | ICD-10-CM | POA: Diagnosis not present

## 2020-08-12 DIAGNOSIS — Z7982 Long term (current) use of aspirin: Secondary | ICD-10-CM | POA: Diagnosis not present

## 2020-08-12 DIAGNOSIS — Z7902 Long term (current) use of antithrombotics/antiplatelets: Secondary | ICD-10-CM | POA: Diagnosis not present

## 2020-08-12 DIAGNOSIS — K219 Gastro-esophageal reflux disease without esophagitis: Secondary | ICD-10-CM | POA: Diagnosis present

## 2020-08-12 DIAGNOSIS — H548 Legal blindness, as defined in USA: Secondary | ICD-10-CM | POA: Diagnosis present

## 2020-08-12 DIAGNOSIS — Z79899 Other long term (current) drug therapy: Secondary | ICD-10-CM

## 2020-08-12 DIAGNOSIS — E7800 Pure hypercholesterolemia, unspecified: Secondary | ICD-10-CM | POA: Diagnosis present

## 2020-08-12 DIAGNOSIS — M1990 Unspecified osteoarthritis, unspecified site: Secondary | ICD-10-CM | POA: Diagnosis present

## 2020-08-12 HISTORY — PX: AMPUTATION: SHX166

## 2020-08-12 HISTORY — DX: Nonrheumatic aortic (valve) stenosis: I35.0

## 2020-08-12 HISTORY — DX: Unspecified glaucoma: H40.9

## 2020-08-12 HISTORY — DX: Peripheral vascular disease, unspecified: I73.9

## 2020-08-12 LAB — POCT I-STAT, CHEM 8
BUN: 15 mg/dL (ref 8–23)
Calcium, Ion: 1.11 mmol/L — ABNORMAL LOW (ref 1.15–1.40)
Chloride: 96 mmol/L — ABNORMAL LOW (ref 98–111)
Creatinine, Ser: 4.9 mg/dL — ABNORMAL HIGH (ref 0.61–1.24)
Glucose, Bld: 120 mg/dL — ABNORMAL HIGH (ref 70–99)
HCT: 32 % — ABNORMAL LOW (ref 39.0–52.0)
Hemoglobin: 10.9 g/dL — ABNORMAL LOW (ref 13.0–17.0)
Potassium: 3.6 mmol/L (ref 3.5–5.1)
Sodium: 139 mmol/L (ref 135–145)
TCO2: 33 mmol/L — ABNORMAL HIGH (ref 22–32)

## 2020-08-12 LAB — GLUCOSE, CAPILLARY
Glucose-Capillary: 112 mg/dL — ABNORMAL HIGH (ref 70–99)
Glucose-Capillary: 136 mg/dL — ABNORMAL HIGH (ref 70–99)

## 2020-08-12 SURGERY — AMPUTATION BELOW KNEE
Anesthesia: Regional | Site: Knee | Laterality: Left

## 2020-08-12 MED ORDER — PROPOFOL 10 MG/ML IV BOLUS
INTRAVENOUS | Status: AC
  Filled 2020-08-12: qty 20

## 2020-08-12 MED ORDER — ACETAMINOPHEN 325 MG PO TABS: 325.0000 mg | ORAL_TABLET | Freq: Four times a day (QID) | ORAL | Status: DC | PRN

## 2020-08-12 MED ORDER — ATORVASTATIN CALCIUM 10 MG PO TABS
10.0000 mg | ORAL_TABLET | Freq: Every morning | ORAL | Status: DC
  Administered 2020-08-13 – 2020-08-19 (×7): 10 mg via ORAL
  Filled 2020-08-12 (×9): qty 1

## 2020-08-12 MED ORDER — PHENOL 1.4 % MT LIQD: 1.0000 | OROMUCOSAL | Status: DC | PRN

## 2020-08-12 MED ORDER — METOPROLOL TARTRATE 5 MG/5ML IV SOLN: 2.0000 mg | INTRAVENOUS | Status: DC | PRN

## 2020-08-12 MED ORDER — CARVEDILOL 3.125 MG PO TABS
3.1250 mg | ORAL_TABLET | Freq: Two times a day (BID) | ORAL | Status: DC
  Administered 2020-08-12 – 2020-08-18 (×9): 3.125 mg via ORAL
  Filled 2020-08-12 (×10): qty 1

## 2020-08-12 MED ORDER — GUAIFENESIN-DM 100-10 MG/5ML PO SYRP: 15.0000 mL | ORAL_SOLUTION | ORAL | Status: DC | PRN

## 2020-08-12 MED ORDER — ACETAMINOPHEN 500 MG PO TABS
500.0000 mg | ORAL_TABLET | Freq: Four times a day (QID) | ORAL | Status: AC
  Administered 2020-08-13: 500 mg via ORAL
  Filled 2020-08-12: qty 1

## 2020-08-12 MED ORDER — RENA-VITE PO TABS
1.0000 | ORAL_TABLET | Freq: Every day | ORAL | Status: DC
  Administered 2020-08-12 – 2020-08-18 (×7): 1 via ORAL
  Filled 2020-08-12 (×7): qty 1

## 2020-08-12 MED ORDER — NETARSUDIL-LATANOPROST 0.02-0.005 % OP SOLN: 1.0000 [drp] | Freq: Every day | OPHTHALMIC | Status: DC

## 2020-08-12 MED ORDER — LABETALOL HCL 5 MG/ML IV SOLN: 10.0000 mg | INTRAVENOUS | Status: DC | PRN

## 2020-08-12 MED ORDER — PROPOFOL 10 MG/ML IV BOLUS
INTRAVENOUS | Status: DC | PRN
  Administered 2020-08-12: 150 mg via INTRAVENOUS
  Administered 2020-08-12: 30 mg via INTRAVENOUS

## 2020-08-12 MED ORDER — HYPROMELLOSE (GONIOSCOPIC) 2.5 % OP SOLN
1.0000 [drp] | OPHTHALMIC | Status: DC | PRN
  Filled 2020-08-12: qty 15

## 2020-08-12 MED ORDER — OXYCODONE HCL 5 MG/5ML PO SOLN: 5.0000 mg | Freq: Once | ORAL | Status: DC | PRN

## 2020-08-12 MED ORDER — 0.9 % SODIUM CHLORIDE (POUR BTL) OPTIME
TOPICAL | Status: DC | PRN
  Administered 2020-08-12: 1000 mL

## 2020-08-12 MED ORDER — PHENYLEPHRINE 40 MCG/ML (10ML) SYRINGE FOR IV PUSH (FOR BLOOD PRESSURE SUPPORT)
PREFILLED_SYRINGE | INTRAVENOUS | Status: DC | PRN
  Administered 2020-08-12 (×5): 80 ug via INTRAVENOUS

## 2020-08-12 MED ORDER — CEFAZOLIN SODIUM-DEXTROSE 2-4 GM/100ML-% IV SOLN
2.0000 g | INTRAVENOUS | Status: AC
  Administered 2020-08-12: 2 g via INTRAVENOUS

## 2020-08-12 MED ORDER — ONDANSETRON HCL 4 MG/2ML IJ SOLN: 4.0000 mg | Freq: Four times a day (QID) | INTRAMUSCULAR | Status: DC | PRN

## 2020-08-12 MED ORDER — GABAPENTIN 100 MG PO CAPS
100.0000 mg | ORAL_CAPSULE | Freq: Every day | ORAL | Status: DC
  Administered 2020-08-12 – 2020-08-17 (×6): 100 mg via ORAL
  Filled 2020-08-12 (×6): qty 1

## 2020-08-12 MED ORDER — OXYCODONE HCL 5 MG PO TABS: 5.0000 mg | ORAL_TABLET | Freq: Once | ORAL | Status: DC | PRN

## 2020-08-12 MED ORDER — CHLORHEXIDINE GLUCONATE CLOTH 2 % EX PADS: 6.0000 | MEDICATED_PAD | Freq: Once | CUTANEOUS | Status: DC

## 2020-08-12 MED ORDER — CHLORHEXIDINE GLUCONATE CLOTH 2 % EX PADS
6.0000 | MEDICATED_PAD | Freq: Every day | CUTANEOUS | Status: DC
  Administered 2020-08-13 – 2020-08-19 (×6): 6 via TOPICAL

## 2020-08-12 MED ORDER — MORPHINE SULFATE (PF) 2 MG/ML IV SOLN
0.5000 mg | INTRAVENOUS | Status: DC | PRN
  Administered 2020-08-13 – 2020-08-17 (×4): 1 mg via INTRAVENOUS
  Filled 2020-08-12 (×4): qty 1

## 2020-08-12 MED ORDER — ASPIRIN EC 81 MG PO TBEC
81.0000 mg | DELAYED_RELEASE_TABLET | Freq: Every morning | ORAL | Status: DC
  Administered 2020-08-13 – 2020-08-19 (×7): 81 mg via ORAL
  Filled 2020-08-12 (×8): qty 1

## 2020-08-12 MED ORDER — DEXAMETHASONE SODIUM PHOSPHATE 10 MG/ML IJ SOLN
INTRAMUSCULAR | Status: AC
  Filled 2020-08-12: qty 1

## 2020-08-12 MED ORDER — BRIMONIDINE TARTRATE 0.15 % OP SOLN
1.0000 [drp] | Freq: Three times a day (TID) | OPHTHALMIC | Status: DC
  Administered 2020-08-12 – 2020-08-19 (×19): 1 [drp] via OPHTHALMIC
  Filled 2020-08-12: qty 5

## 2020-08-12 MED ORDER — ALBUMIN HUMAN 5 % IV SOLN
12.5000 g | Freq: Once | INTRAVENOUS | Status: AC
  Administered 2020-08-12: 12.5 g via INTRAVENOUS

## 2020-08-12 MED ORDER — SODIUM CHLORIDE 0.9 % IV SOLN: INTRAVENOUS | Status: DC

## 2020-08-12 MED ORDER — ISOSORBIDE MONONITRATE ER 30 MG PO TB24
30.0000 mg | ORAL_TABLET | Freq: Every morning | ORAL | Status: DC
  Administered 2020-08-13 – 2020-08-18 (×6): 30 mg via ORAL
  Filled 2020-08-12 (×7): qty 1

## 2020-08-12 MED ORDER — CLOPIDOGREL BISULFATE 75 MG PO TABS
75.0000 mg | ORAL_TABLET | Freq: Every day | ORAL | Status: DC
  Administered 2020-08-13 – 2020-08-19 (×7): 75 mg via ORAL
  Filled 2020-08-12 (×7): qty 1

## 2020-08-12 MED ORDER — ACETAMINOPHEN 500 MG PO TABS
1000.0000 mg | ORAL_TABLET | Freq: Once | ORAL | Status: AC
Start: 2020-08-12 — End: 2020-08-12
  Administered 2020-08-12: 1000 mg via ORAL
  Filled 2020-08-12: qty 2

## 2020-08-12 MED ORDER — BISACODYL 5 MG PO TBEC
5.0000 mg | DELAYED_RELEASE_TABLET | Freq: Every day | ORAL | Status: DC | PRN
  Administered 2020-08-15 – 2020-08-18 (×3): 5 mg via ORAL
  Filled 2020-08-12 (×3): qty 1

## 2020-08-12 MED ORDER — LIDOCAINE HCL (PF) 2 % IJ SOLN
INTRAMUSCULAR | Status: DC | PRN
  Administered 2020-08-12: 60 mg via INTRADERMAL

## 2020-08-12 MED ORDER — HYDROCODONE-ACETAMINOPHEN 5-325 MG PO TABS
1.0000 | ORAL_TABLET | ORAL | Status: DC | PRN
Start: 2020-08-12 — End: 2020-08-19
  Administered 2020-08-13: 1 via ORAL
  Administered 2020-08-13: 2 via ORAL
  Administered 2020-08-15 – 2020-08-16 (×2): 1 via ORAL
  Administered 2020-08-17: 2 via ORAL
  Administered 2020-08-18 – 2020-08-19 (×3): 1 via ORAL
  Filled 2020-08-12 (×3): qty 1
  Filled 2020-08-12 (×3): qty 2
  Filled 2020-08-12 (×2): qty 1

## 2020-08-12 MED ORDER — PILOCARPINE HCL 4 % OP SOLN
1.0000 [drp] | Freq: Four times a day (QID) | OPHTHALMIC | Status: DC
  Administered 2020-08-12 – 2020-08-19 (×24): 1 [drp] via OPHTHALMIC
  Filled 2020-08-12: qty 15

## 2020-08-12 MED ORDER — POLYETHYLENE GLYCOL 3350 17 G PO PACK
17.0000 g | PACK | Freq: Every day | ORAL | Status: DC | PRN
  Administered 2020-08-15 – 2020-08-18 (×3): 17 g via ORAL
  Filled 2020-08-12 (×3): qty 1

## 2020-08-12 MED ORDER — CHLORHEXIDINE GLUCONATE CLOTH 2 % EX PADS
6.0000 | MEDICATED_PAD | Freq: Once | CUTANEOUS | Status: DC
  Administered 2020-08-12: 6 via TOPICAL

## 2020-08-12 MED ORDER — DOXERCALCIFEROL 4 MCG/2ML IV SOLN
8.0000 ug | INTRAVENOUS | Status: DC
  Filled 2020-08-12 (×2): qty 4

## 2020-08-12 MED ORDER — HYDRALAZINE HCL 20 MG/ML IJ SOLN: 5.0000 mg | INTRAMUSCULAR | Status: DC | PRN

## 2020-08-12 MED ORDER — BUPIVACAINE HCL (PF) 0.5 % IJ SOLN
INTRAMUSCULAR | Status: DC | PRN
  Administered 2020-08-12: 30 mL via PERINEURAL

## 2020-08-12 MED ORDER — ONDANSETRON HCL 4 MG/2ML IJ SOLN: 4.0000 mg | Freq: Once | INTRAMUSCULAR | Status: DC | PRN

## 2020-08-12 MED ORDER — PHENYLEPHRINE HCL-NACL 10-0.9 MG/250ML-% IV SOLN
INTRAVENOUS | Status: DC | PRN
  Administered 2020-08-12: 25 ug/min via INTRAVENOUS

## 2020-08-12 MED ORDER — EPHEDRINE SULFATE-NACL 50-0.9 MG/10ML-% IV SOSY
PREFILLED_SYRINGE | INTRAVENOUS | Status: DC | PRN
  Administered 2020-08-12 (×3): 10 mg via INTRAVENOUS
  Administered 2020-08-12: 5 mg via INTRAVENOUS

## 2020-08-12 MED ORDER — ONDANSETRON HCL 4 MG/2ML IJ SOLN
INTRAMUSCULAR | Status: AC
  Filled 2020-08-12: qty 2

## 2020-08-12 MED ORDER — CHLORHEXIDINE GLUCONATE 0.12 % MT SOLN
OROMUCOSAL | Status: AC
  Administered 2020-08-12: 15 mL
  Filled 2020-08-12: qty 15

## 2020-08-12 MED ORDER — DORZOLAMIDE HCL-TIMOLOL MAL 2-0.5 % OP SOLN
1.0000 [drp] | Freq: Two times a day (BID) | OPHTHALMIC | Status: DC
  Administered 2020-08-12 – 2020-08-19 (×13): 1 [drp] via OPHTHALMIC
  Filled 2020-08-12: qty 10

## 2020-08-12 MED ORDER — FENTANYL CITRATE (PF) 100 MCG/2ML IJ SOLN: 50.0000 ug | Freq: Once | INTRAMUSCULAR | Status: AC

## 2020-08-12 MED ORDER — PANTOPRAZOLE SODIUM 40 MG PO TBEC
40.0000 mg | DELAYED_RELEASE_TABLET | Freq: Every morning | ORAL | Status: DC
  Administered 2020-08-13 – 2020-08-19 (×7): 40 mg via ORAL
  Filled 2020-08-12 (×8): qty 1

## 2020-08-12 MED ORDER — BUPIVACAINE LIPOSOME 1.3 % IJ SUSP
INTRAMUSCULAR | Status: DC | PRN
  Administered 2020-08-12: 10 mL via PERINEURAL

## 2020-08-12 MED ORDER — FENTANYL CITRATE (PF) 250 MCG/5ML IJ SOLN
INTRAMUSCULAR | Status: AC
  Filled 2020-08-12: qty 5

## 2020-08-12 MED ORDER — DEXAMETHASONE SODIUM PHOSPHATE 10 MG/ML IJ SOLN
INTRAMUSCULAR | Status: DC | PRN
  Administered 2020-08-12: 4 mg via INTRAVENOUS

## 2020-08-12 MED ORDER — ALBUMIN HUMAN 5 % IV SOLN
INTRAVENOUS | Status: AC
  Filled 2020-08-12: qty 250

## 2020-08-12 MED ORDER — FENTANYL CITRATE (PF) 100 MCG/2ML IJ SOLN
INTRAMUSCULAR | Status: AC
  Administered 2020-08-12: 50 ug via INTRAVENOUS
  Filled 2020-08-12: qty 2

## 2020-08-12 MED ORDER — HYDROCODONE-ACETAMINOPHEN 7.5-325 MG PO TABS
1.0000 | ORAL_TABLET | ORAL | Status: DC | PRN
  Administered 2020-08-12: 2 via ORAL
  Administered 2020-08-13 – 2020-08-16 (×3): 1 via ORAL
  Administered 2020-08-16 – 2020-08-17 (×4): 2 via ORAL
  Administered 2020-08-18: 1 via ORAL
  Administered 2020-08-18 (×3): 2 via ORAL
  Administered 2020-08-19: 1 via ORAL
  Administered 2020-08-19: 2 via ORAL
  Filled 2020-08-12 (×2): qty 1
  Filled 2020-08-12: qty 2
  Filled 2020-08-12: qty 1
  Filled 2020-08-12 (×10): qty 2

## 2020-08-12 MED ORDER — FERRIC CITRATE 1 GM 210 MG(FE) PO TABS
420.0000 mg | ORAL_TABLET | Freq: Three times a day (TID) | ORAL | Status: DC
  Administered 2020-08-12 – 2020-08-18 (×13): 420 mg via ORAL
  Filled 2020-08-12 (×13): qty 2

## 2020-08-12 MED ORDER — ONDANSETRON HCL 4 MG/2ML IJ SOLN
INTRAMUSCULAR | Status: DC | PRN
  Administered 2020-08-12: 4 mg via INTRAVENOUS

## 2020-08-12 MED ORDER — HYDROMORPHONE HCL 1 MG/ML IJ SOLN: 0.2500 mg | INTRAMUSCULAR | Status: DC | PRN

## 2020-08-12 MED ORDER — CEFAZOLIN SODIUM-DEXTROSE 2-4 GM/100ML-% IV SOLN
INTRAVENOUS | Status: AC
  Filled 2020-08-12: qty 100

## 2020-08-12 MED ORDER — DOCUSATE SODIUM 100 MG PO CAPS
100.0000 mg | ORAL_CAPSULE | Freq: Every morning | ORAL | Status: DC
  Administered 2020-08-13 – 2020-08-19 (×7): 100 mg via ORAL
  Filled 2020-08-12 (×8): qty 1

## 2020-08-12 MED ORDER — LIDOCAINE HCL (PF) 2 % IJ SOLN
INTRAMUSCULAR | Status: AC
  Filled 2020-08-12: qty 5

## 2020-08-12 SURGICAL SUPPLY — 48 items
BANDAGE ESMARK 6X9 LF (GAUZE/BANDAGES/DRESSINGS) IMPLANT
BLADE SAW SAG 73X25 THK (BLADE) ×2
BLADE SAW SGTL 73X25 THK (BLADE) ×1 IMPLANT
BNDG COHESIVE 6X5 TAN STRL LF (GAUZE/BANDAGES/DRESSINGS) ×3 IMPLANT
BNDG ELASTIC 4X5.8 VLCR STR LF (GAUZE/BANDAGES/DRESSINGS) ×3 IMPLANT
BNDG ELASTIC 6X5.8 VLCR STR LF (GAUZE/BANDAGES/DRESSINGS) IMPLANT
BNDG ESMARK 6X9 LF (GAUZE/BANDAGES/DRESSINGS)
BNDG GAUZE ELAST 4 BULKY (GAUZE/BANDAGES/DRESSINGS) ×3 IMPLANT
CANISTER SUCT 3000ML PPV (MISCELLANEOUS) ×3 IMPLANT
CLIP VESOCCLUDE MED 6/CT (CLIP) IMPLANT
COVER BACK TABLE 60X90IN (DRAPES) IMPLANT
COVER SURGICAL LIGHT HANDLE (MISCELLANEOUS) ×3 IMPLANT
COVER WAND RF STERILE (DRAPES) IMPLANT
DRAIN CHANNEL 19F RND (DRAIN) IMPLANT
DRAPE HALF SHEET 40X57 (DRAPES) ×3 IMPLANT
DRAPE ORTHO SPLIT 77X108 STRL (DRAPES) ×6
DRAPE SURG ORHT 6 SPLT 77X108 (DRAPES) ×2 IMPLANT
DRSG ADAPTIC 3X8 NADH LF (GAUZE/BANDAGES/DRESSINGS) ×3 IMPLANT
ELECT REM PT RETURN 9FT ADLT (ELECTROSURGICAL) ×3
ELECTRODE REM PT RTRN 9FT ADLT (ELECTROSURGICAL) ×1 IMPLANT
EVACUATOR SILICONE 100CC (DRAIN) IMPLANT
GAUZE SPONGE 4X4 12PLY STRL (GAUZE/BANDAGES/DRESSINGS) ×3 IMPLANT
GLOVE BIO SURGEON STRL SZ7.5 (GLOVE) ×3 IMPLANT
GLOVE SRG 8 PF TXTR STRL LF DI (GLOVE) ×1 IMPLANT
GLOVE SURG UNDER POLY LF SZ6.5 (GLOVE) ×3 IMPLANT
GLOVE SURG UNDER POLY LF SZ8 (GLOVE) ×3
GOWN STRL REUS W/ TWL LRG LVL3 (GOWN DISPOSABLE) ×2 IMPLANT
GOWN STRL REUS W/ TWL XL LVL3 (GOWN DISPOSABLE) ×2 IMPLANT
GOWN STRL REUS W/TWL LRG LVL3 (GOWN DISPOSABLE) ×6
GOWN STRL REUS W/TWL XL LVL3 (GOWN DISPOSABLE) ×6
KIT BASIN OR (CUSTOM PROCEDURE TRAY) ×3 IMPLANT
KIT TURNOVER KIT B (KITS) ×3 IMPLANT
NS IRRIG 1000ML POUR BTL (IV SOLUTION) ×3 IMPLANT
PACK GENERAL/GYN (CUSTOM PROCEDURE TRAY) ×3 IMPLANT
PAD ARMBOARD 7.5X6 YLW CONV (MISCELLANEOUS) ×6 IMPLANT
STAPLER VISISTAT 35W (STAPLE) ×3 IMPLANT
STOCKINETTE IMPERVIOUS LG (DRAPES) IMPLANT
SUT ETHILON 3 0 PS 1 (SUTURE) IMPLANT
SUT SILK 2 0 (SUTURE) ×3
SUT SILK 2 0 SH CR/8 (SUTURE) ×3 IMPLANT
SUT SILK 2-0 18XBRD TIE 12 (SUTURE) ×1 IMPLANT
SUT SILK 3 0 (SUTURE)
SUT SILK 3-0 18XBRD TIE 12 (SUTURE) IMPLANT
SUT VIC AB 2-0 CT1 18 (SUTURE) ×6 IMPLANT
SUT VIC AB 3-0 SH 18 (SUTURE) IMPLANT
TOWEL GREEN STERILE (TOWEL DISPOSABLE) ×6 IMPLANT
UNDERPAD 30X36 HEAVY ABSORB (UNDERPADS AND DIAPERS) ×3 IMPLANT
WATER STERILE IRR 1000ML POUR (IV SOLUTION) ×3 IMPLANT

## 2020-08-12 NOTE — Chronic Care Management (AMB) (Signed)
  Chronic Care Management   Inpatient Admit Review Note  08/12/2020 Name: Elijah White MRN: [id number] DOB: 14-Oct-1940  Elijah White is a 80 y.o. year old male who is a primary care patient of [name], MD. Elijah White is actively engaged with the embedded care management team in the primary care practice and is being followed by RN Case Manager BSW for assistance with disease management and care coordination needs related to DM and ESRD.   Elijah White is currently admitted to the hospital for planned  BKA . SW reviewed chart to not surgery successful and patient in recovery.  Plan: CM team will collaborate with [facility] and will follow patient post discharge.    [name], BSW, CDP Social Worker, Certified Dementia Practitioner [place] [facility] [place] [facility] [phone]  Total time spent performing care coordination and/or care management activities with the patient by phone or face to face = 10 minutes.

## 2020-08-12 NOTE — Op Note (Addendum)
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 Marty Heck, MD  Assistant: Risa Grill, PA  Indications: Patient is an 80 year old male with end-stage renal disease recently seen in the office with worsening tissue loss in the left lower extremity.  He underwent stenting of his left SFA above-knee popliteal occlusion with excellent results and has brisk Doppler flow in the foot.  Unfortunately he has had ongoing severe pain in the heel that has been uncontrolled even with narcotics and has led to multiple presentations to the ED and in the office.  Ultimately he has no other options for revascularization and we recommended left below-knee amputation given he did not feel he could continue to tolerate the pain.  An assistant was needed for exposure and to expedite the case.  Findings: Left below-knee amputation with healthy tissue margins.  Anesthesia: Regional block  Details: Patient was taken to the operating room after informed consent was obtained.  Placed on operative table in the supine position.  He did get an LMA and got a preoperative block.  The left leg was then prepped and draped in usual sterile fashion.  Antibiotics were given.  Timeout was performed.  I initially marked out the incision for the below-knee amputation about one handbreadth below the tibial tuberosity.  I then used a silk suture here and measured the circumference of the calf.  This was then cut into two thirds and one third distance.  I used the two thirds distance to mark the anterior posterior length of the incision and the one third diameter to mark the length of the flap.  Once this was all marked we then used a scalpel to make the incision.  Bovie cautery was used to open the subcutaneous tissue and the muscle fascia.  The underlying muscle appeared healthy.  I  then isolated the tibia and fibula with Bovie cautery and the anterior tibial vessels were divided between hemostats and oversewn with silk suture.  Fibula was transected with a bone cutter.  I then transected the tibia with an oscillating saw.  Using a hand hook I then completed the posterior flap.  It was apparent at first that the posterior flap was too thick for closure so I had to take down some of the soleus in order to debulk the flap.  We identified the peroneal and posterior tibial arteries that were controlled with hemostats and then oversewn with silk sutures.  We got hemostasis with Bovie cautery and suture ligation.  The flap was irrigated out.  I then closed the fascia after it was reapproximated with 2-0 Vicryl until we could not insert a fingerbreadth and the skin was closed with staples.  Dry sterile dressings were applied.  Complication: None  Condition: Stable  Marty Heck, MD [facility] Office: [place]

## 2020-08-12 NOTE — Anesthesia Procedure Notes (Signed)
Anesthesia Regional Block: Popliteal block   Pre-Anesthetic Checklist: , timeout performed,  Correct Patient, Correct Site, Correct Laterality,  Correct Procedure, Correct Position, site marked,  Risks and benefits discussed,  Surgical consent,  Pre-op evaluation,  At surgeon's request and post-op pain management  Laterality: Left  Prep: Maximum Sterile Barrier Precautions used, chloraprep       Needles:  Injection technique: Single-shot  Needle Type: Echogenic Stimulator Needle     Needle Length: 9cm  Needle Gauge: 22     Additional Needles:   Procedures:,,,, ultrasound used (permanent image in chart),,    Narrative:  Start time: 08/12/2020 9:50 AM End time: 08/12/2020 9:55 AM Injection made incrementally with aspirations every 5 mL.  Performed by: Personally  Anesthesiologist: Pervis Hocking, DO  Additional Notes: Monitors applied. No increased pain on injection. No increased resistance to injection. Injection made in 5cc increments. Good needle visualization. Patient tolerated procedure well.

## 2020-08-12 NOTE — Transfer of Care (Signed)
Immediate Anesthesia Transfer of Care Note  Patient: Elijah White  Procedure(s) Performed: LEFT BELOW KNEE AMPUTATION (Left: Knee)  Patient Location: PACU  Anesthesia Type:General and Regional  Level of Consciousness: oriented, sedated and patient cooperative  Airway & Oxygen Therapy: Patient Spontanous Breathing and Patient connected to face mask oxygen  Post-op Assessment: Report given to RN and Post -op Vital signs reviewed and stable  Post vital signs: Reviewed  Last Vitals:  Vitals Value Taken Time  BP 86/55 08/12/20 1200  Temp    Pulse 68 08/12/20 1204  Resp 18 08/12/20 1204  SpO2 100 % 08/12/20 1204  Vitals shown include unvalidated device data.  Last Pain:  Vitals:   08/12/20 1003  TempSrc:   PainSc: 0-No pain      Patients Stated Pain Goal: 5 (79/43/27 6147)  Complications: No notable events documented.

## 2020-08-12 NOTE — Consult Note (Addendum)
[facility] Nephrology Consult Note: Reason for Consult: To manage dialysis and dialysis related needs Referring Physician: Dr Carlis Abbott, [place] Gave  HPI:  Elijah White is an 80 y.o. male with history of hypertension, DM, AAS, chronic anemia, secondary hyperparathyroidism, ESRD on HD TTS, PAD who underwent elective surgery for left BKA amputation, seen as a consultation to manage ESRD. Patient has a diagnosis of critical limb ischemia and was followed by vascular.  He had left below-knee amputation by Dr. Carlis Abbott.  He is currently in PACU. His dialysis was yesterday uneventful.  His blood pressure and labs acceptable.  Patient is now waking up from anesthesia.  He denies nausea, vomiting, chest pain, shortness of breath.  Blood pressure was soft but gradually improving.  The labs showed potassium level 3.6, BUN 15, hemoglobin 10.9.  He will be admitted for further management by vascular surgeon.  OP HD orders:  Dialyzes at   [facility], TTS, 4 hours EDW 72.5 kg 2K, 2 ca, LUE AVF, No heparin Venofer 100 mg each rx Hectorol 8 mcg each rx Sensipar 60 mg at HD  Past Medical History:  Diagnosis Date   Anemia    low iron   Aortic stenosis    s/p TAVR 01/19/20   Arthritis    Cancer ([place])    prostate, s/p I-125 seed implant 05/18/05   Colon polyps ~ 1993 and 2003   Dr [name], [facility].  08/2001 colonoscopy: tubular adenoma at cecum.     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   Elevated cholesterol with high triglycerides    ESRD (end stage renal disease) (HCC)    [facility] - [facility]   GERD (gastroesophageal reflux disease)    Glaucoma    Hypertension    Legally blind in left eye, as defined in [place]    has pinpoint vision in right eye   PAD (peripheral artery disease) ([place])    RBBB     Past Surgical History:  Procedure Laterality Date   ABDOMINAL AORTOGRAM W/LOWER EXTREMITY N/A 07/25/2020   Procedure: ABDOMINAL AORTOGRAM W/LOWER  EXTREMITY;  Surgeon: Marty Heck, MD;  Location: [place] CV LAB;  Service: Cardiovascular;  Laterality: N/A;   AV FISTULA PLACEMENT Left 03/20/2018   Procedure: ARTERIOVENOUS (AV) FISTULA CREATION ARM;  Surgeon: [name], MD;  Location: [place];  Service: Vascular;  Laterality: Left;   [place] Left 05/21/2018   Procedure: LEFT BASILIC VEIN FISTULA SECOND STAGE;  Surgeon: [name], MD;  Location: [place];  Service: Vascular;  Laterality: Left;   COLONOSCOPY     COLONOSCOPY WITH PROPOFOL N/A 11/30/2019   Procedure: COLONOSCOPY WITH PROPOFOL;  Surgeon: Ronnette Juniper, MD;  Location: [place];  Service: Gastroenterology;  Laterality: N/A;   GLAUCOMA SURGERY  2019   multiple surgeries   HEMOSTASIS CLIP PLACEMENT  11/30/2019   Procedure: HEMOSTASIS CLIP PLACEMENT;  Surgeon: Ronnette Juniper, MD;  Location: [facility] ENDOSCOPY;  Service: Gastroenterology;;   KNEE ARTHROSCOPY     MULTIPLE EXTRACTIONS WITH ALVEOLOPLASTY N/A 12/30/2019   Procedure: MULTIPLE EXTRACTION WITH ALVEOLOPLASTY;  Surgeon: Charlaine Dalton, DMD;  Location: [place];  Service: Dentistry;  Laterality: N/A;   PERIPHERAL VASCULAR INTERVENTION Left 07/25/2020   Procedure: PERIPHERAL VASCULAR INTERVENTION;  Surgeon: Marty Heck, MD;  Location: [place] CV LAB;  Service: Cardiovascular;  Laterality: Left;  superficial femoral   POLYPECTOMY  11/30/2019   Procedure: POLYPECTOMY;  Surgeon: Ronnette Juniper, MD;  Location: [facility] ENDOSCOPY;  Service: Gastroenterology;;   PROSTATE SURGERY     RIGHT/LEFT HEART CATH AND CORONARY ANGIOGRAPHY N/A 12/03/2019   Procedure: RIGHT/LEFT HEART CATH AND CORONARY ANGIOGRAPHY;  Surgeon: Burnell Blanks, MD;  Location: [place] CV LAB;  Service: Cardiovascular;  Laterality: N/A;   SUBMUCOSAL TATTOO INJECTION  11/30/2019   Procedure: SUBMUCOSAL TATTOO INJECTION;  Surgeon: Ronnette Juniper, MD;  Location: [place];  Service: Gastroenterology;;   TEE  WITHOUT CARDIOVERSION N/A 01/19/2020   Procedure: TRANSESOPHAGEAL ECHOCARDIOGRAM (TEE);  Surgeon: Burnell Blanks, MD;  Location: [place] CV LAB;  Service: Open Heart Surgery;  Laterality: N/A;   TRANSCATHETER AORTIC VALVE REPLACEMENT, TRANSFEMORAL Left 01/19/2020   Procedure: TRANSCATHETER AORTIC VALVE REPLACEMENT, LEFT TRANSFEMORAL;  Surgeon: Burnell Blanks, MD;  Location: [place] CV LAB;  Service: Open Heart Surgery;  Laterality: Left;    Family History  Problem Relation Age of Onset   CAD Mother     Social History:  reports that he quit smoking about 2 years ago. His smoking use included cigarettes. He smoked an average of 0.50 packs per day. He has never used smokeless tobacco. He reports that he does not drink alcohol and does not use drugs.  Allergies: No Known Allergies  Medications: I have reviewed the patient's current medications.   Results for orders placed or performed during the hospital encounter of 08/12/20 (from the past 48 hour(s))  Glucose, capillary     Status: Abnormal   Collection Time: 08/12/20  8:39 AM  Result Value Ref Range   Glucose-Capillary 112 (H) 70 - 99 mg/dL    Comment: Glucose reference range applies only to samples taken after fasting for at least 8 hours.  I-STAT, chem 8     Status: Abnormal   Collection Time: 08/12/20  9:30 AM  Result Value Ref Range   Sodium 139 135 - 145 mmol/L   Potassium 3.6 3.5 - 5.1 mmol/L   Chloride 96 (L) 98 - 111 mmol/L   BUN 15 8 - 23 mg/dL   Creatinine, Ser 4.90 (H) 0.61 - 1.24 mg/dL   Glucose, Bld 120 (H) 70 - 99 mg/dL    Comment: Glucose reference range applies only to samples taken after fasting for at least 8 hours.   Calcium, Ion 1.11 (L) 1.15 - 1.40 mmol/L   TCO2 33 (H) 22 - 32 mmol/L   Hemoglobin 10.9 (L) 13.0 - 17.0 g/dL   HCT 32.0 (L) 39.0 - 52.0 %  Glucose, capillary     Status: Abnormal   Collection Time: 08/12/20 12:00 PM  Result Value Ref Range   Glucose-Capillary 136 (H) 70  - 99 mg/dL    Comment: Glucose reference range applies only to samples taken after fasting for at least 8 hours.    No results found.  ROS: As per H&P.  Rest of the system reviewed and negative. Blood pressure (!) 98/52, pulse 70, temperature 98.1 F (36.7 C), resp. rate 17, height 5\' 5"  (1.651 m), weight 73 kg, SpO2 96 %. Gen: NAD, comfortable Respiratory: Clear bilateral, no wheezing or crackle Cardiovascular: Regular rate rhythm S1-S2 normal, no rubs GI: Abdomen soft, nontender, nondistended Extremities, left BKA, no edema on right side. Skin: No rash or ulcer Neurology: Alert, awake, following commands, Dialysis Access: AV fistula with good thrill and bruit  Assessment/Plan:  #Left lower extremity critical limb ischemia status post left below-knee amputation by Dr. Carlis Abbott today.  Currently recovering in the PACU.  Further management per vascular surgeon.  #  ESRD: TTS schedule.  Plan for regular dialysis tomorrow.  No heparin.  Need to reevaluate dry weight after amputation.  # Hypertension: Blood pressure soft.  On carvedilol and isosorbide at home.  May be able to resume once blood pressure improved.  # Anemia of ESRD: Hemoglobin at goal.  # Metabolic Bone Disease: Continue Hectorol.  Monitor calcium phosphorus level.  Thank you for the consult.  Discussed with the PACU nurse.  Delayna Sparlin Tanna Furry 08/12/2020, 1:37 PM

## 2020-08-12 NOTE — Progress Notes (Signed)
Pt admitted to [facility] from PACU.  Pt is A&OX4 and neuro intact.  Pt placed on telemetry and CCMD notified.  Vitals taken and all within normal range.  Pt is currently comfortable and not in pain.

## 2020-08-12 NOTE — Anesthesia Postprocedure Evaluation (Signed)
Anesthesia Post Note  Patient: Elijah White  Procedure(s) Performed: LEFT BELOW KNEE AMPUTATION (Left: Knee)     Patient location during evaluation: PACU Anesthesia Type: Regional and General Level of consciousness: awake and alert, oriented and patient cooperative Pain management: pain level controlled Vital Signs Assessment: post-procedure vital signs reviewed and stable Respiratory status: spontaneous breathing, nonlabored ventilation and respiratory function stable Cardiovascular status: blood pressure returned to baseline and stable Postop Assessment: no apparent nausea or vomiting Anesthetic complications: no   No notable events documented.  Last Vitals:  Vitals:   08/12/20 0959 08/12/20 1000  BP:    Pulse: 73 71  Resp: 19 14  Temp:    SpO2: 100% 100%    Last Pain:  Vitals:   08/12/20 1003  TempSrc:   PainSc: 0-No pain                 Pervis Hocking

## 2020-08-12 NOTE — Anesthesia Procedure Notes (Signed)
Procedure Name: LMA Insertion Date/Time: 08/12/2020 10:31 AM Performed by: Jenne Campus, CRNA Pre-anesthesia Checklist: Patient identified, Emergency Drugs available, Suction available and Patient being monitored Patient Re-evaluated:Patient Re-evaluated prior to induction Oxygen Delivery Method: Circle System Utilized Preoxygenation: Pre-oxygenation with 100% oxygen Induction Type: IV induction Ventilation: Mask ventilation without difficulty LMA: LMA inserted LMA Size: 4.0 Number of attempts: 1 Airway Equipment and Method: Bite block Placement Confirmation: positive ETCO2 and breath sounds checked- equal and bilateral Tube secured with: Tape Dental Injury: Teeth and Oropharynx as per pre-operative assessment

## 2020-08-12 NOTE — Anesthesia Procedure Notes (Signed)
Anesthesia Regional Block: Adductor canal block   Pre-Anesthetic Checklist: , timeout performed,  Correct Patient, Correct Site, Correct Laterality,  Correct Procedure, Correct Position, site marked,  Risks and benefits discussed,  Surgical consent,  Pre-op evaluation,  At surgeon's request and post-op pain management  Laterality: Left  Prep: Maximum Sterile Barrier Precautions used, chloraprep       Needles:  Injection technique: Single-shot  Needle Type: Echogenic Stimulator Needle     Needle Length: 9cm  Needle Gauge: 22     Additional Needles:   Procedures:,,,, ultrasound used (permanent image in chart),,    Narrative:  Start time: 08/12/2020 9:55 AM End time: 08/12/2020 10:00 AM Injection made incrementally with aspirations every 5 mL.  Performed by: Personally  Anesthesiologist: Pervis Hocking, DO  Additional Notes: Monitors applied. No increased pain on injection. No increased resistance to injection. Injection made in 5cc increments. Good needle visualization. Patient tolerated procedure well.

## 2020-08-12 NOTE — H&P (Signed)
History and Physical Interval Note:  08/12/2020 10:16 AM  [name]  has presented today for surgery, with the diagnosis of CRITICAL LIMB ISCHEMIA, GANGRENE OF LEFT FIRST TOE.  The various methods of treatment have been discussed with the patient and family. After consideration of risks, benefits and other options for treatment, the patient has consented to  Procedure(s): LEFT BELOW KNEE AMPUTATION (Left) as a surgical intervention.  The patient's history has been reviewed, patient examined, no change in status, stable for surgery.  I have reviewed the patient's chart and labs.  Questions were answered to the patient's satisfaction.    Left BKA.  SFA popliteal stent remains patent but severe heal pain that he can no longer tolerate even with narcotics.  Marty Heck  Office Note       CC:  follow up Requesting Provider:  [name], MD   HPI: XAIDYN KEPNER is a 80 y.o. (Apr 11, 1940) male who presents for follow-up visit due to rest pain of left foot. Recent arteriogram with intervention including stent and angioplasy of left SFA. Has chronic ischemic changes to toes as well as significant heel pain on the left.  He is diabetic with diabetic neuropathy.  He is compliant with asa, statin and Plavix. His daughter and son accompany him today. 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   He continues with significant left heel pain.    The pt is on a statin for cholesterol management.    The pt is on an aspirin.    Other AC:  Plavix The pt is on BB for hypertension. The pt does have diabetes. Tobacco hx:  former   History of ESRD on TTS at [facility]        Past Medical History:  Diagnosis Date   Anemia      low iron   Arthritis     Cancer [facility]) ?2006    prostate   Colon polyps ~ 1993 and 2003    Dr [name], [facility].  08/2001 colonoscopy: tubular adenoma at cecum.     Diabetes mellitus without complication (HCC)       Type II - no medications   Elevated cholesterol with high triglycerides     ESRD (end stage renal disease) ([place])      [facility] - [facility]   GERD (gastroesophageal reflux disease)     Hypertension     Legally blind in left eye, as defined in [place]      has pinpoint vision in right eye   RBBB             Past Surgical History:  Procedure Laterality Date   ABDOMINAL AORTOGRAM W/LOWER EXTREMITY N/A 07/25/2020    Procedure: ABDOMINAL AORTOGRAM W/LOWER EXTREMITY;  Surgeon: Marty Heck, MD;  Location: [place] CV LAB;  Service: Cardiovascular;  Laterality: N/A;   AV FISTULA PLACEMENT Left 03/20/2018    Procedure: ARTERIOVENOUS (AV) FISTULA CREATION ARM;  Surgeon: [name], MD;  Location: [place];  Service: Vascular;  Laterality: Left;   [place] Left 05/21/2018    Procedure: LEFT BASILIC VEIN FISTULA SECOND STAGE;  Surgeon: [name], MD;  Location: [place];  Service: Vascular;  Laterality: Left;   COLONOSCOPY       COLONOSCOPY WITH PROPOFOL N/A 11/30/2019    Procedure: COLONOSCOPY WITH PROPOFOL;  Surgeon: Ronnette Juniper, MD;  Location: [place];  Service: Gastroenterology;  Laterality: N/A;   GLAUCOMA  SURGERY   2019    multiple surgeries   HEMOSTASIS CLIP PLACEMENT   11/30/2019    Procedure: HEMOSTASIS CLIP PLACEMENT;  Surgeon: Ronnette Juniper, MD;  Location: [facility] ENDOSCOPY;  Service: Gastroenterology;;   KNEE ARTHROSCOPY       MULTIPLE EXTRACTIONS WITH ALVEOLOPLASTY N/A 12/30/2019    Procedure: MULTIPLE EXTRACTION WITH ALVEOLOPLASTY;  Surgeon: Charlaine Dalton, DMD;  Location: [place];  Service: Dentistry;  Laterality: N/A;   PERIPHERAL VASCULAR INTERVENTION Left 07/25/2020    Procedure: PERIPHERAL VASCULAR INTERVENTION;  Surgeon: Marty Heck, MD;  Location: [place] CV LAB;  Service: Cardiovascular;  Laterality: Left;  superficial femoral   POLYPECTOMY   11/30/2019    Procedure: POLYPECTOMY;  Surgeon: Ronnette Juniper, MD;  Location: [place];  Service: Gastroenterology;;   PROSTATE SURGERY       RIGHT/LEFT HEART CATH AND CORONARY ANGIOGRAPHY N/A 12/03/2019    Procedure: RIGHT/LEFT HEART CATH AND CORONARY ANGIOGRAPHY;  Surgeon: Burnell Blanks, MD;  Location: [place] CV LAB;  Service: Cardiovascular;  Laterality: N/A;   SUBMUCOSAL TATTOO INJECTION   11/30/2019    Procedure: SUBMUCOSAL TATTOO INJECTION;  Surgeon: Ronnette Juniper, MD;  Location: [place];  Service: Gastroenterology;;   TEE WITHOUT CARDIOVERSION N/A 01/19/2020    Procedure: TRANSESOPHAGEAL ECHOCARDIOGRAM (TEE);  Surgeon: Burnell Blanks, MD;  Location: [place] CV LAB;  Service: Open Heart Surgery;  Laterality: N/A;   TRANSCATHETER AORTIC VALVE REPLACEMENT, TRANSFEMORAL Left 01/19/2020    Procedure: TRANSCATHETER AORTIC VALVE REPLACEMENT, LEFT TRANSFEMORAL;  Surgeon: Burnell Blanks, MD;  Location: [place] CV LAB;  Service: Open Heart Surgery;  Laterality: Left;      Social History         Socioeconomic History   Marital status: Widowed      Spouse name: Not on file   Number of children: Not on file   Years of education: Not on file   Highest education level: Not on file  Occupational History   Occupation: retired  Tobacco Use   Smoking status: Former Smoker      Packs/day: 0.50      Quit date: 03/23/2018      Years since quitting: 2.3   Smokeless tobacco: Never Used  Vaping Use   Vaping Use: Never used  Substance and Sexual Activity   Alcohol use: No   Drug use: No   Sexual activity: Not Currently  Other Topics Concern   Not on file  Social History Narrative   Not on file    Social Determinants of Health       Financial Resource Strain: Low Risk   Difficulty of Paying Living Expenses: Not hard at all  Food Insecurity: No Food Insecurity   Worried About Charity fundraiser in the Last Year: Never true   [place] in the Last Year: Never true  Transportation Needs: No Transportation Needs   Lack  of Transportation (Medical): No   Lack of Transportation (Non-Medical): No  Physical Activity: Inactive   Days of Exercise per Week: 0 days   Minutes of Exercise per Session: 0 min  Stress: No Stress Concern Present   Feeling of Stress : Not at all  Social Connections: Not on file  Intimate Partner Violence: Not on file         Family History  Problem Relation Age of Onset   CAD Mother              Current Outpatient Medications  Medication Sig Dispense Refill   acetaminophen (TYLENOL) 500 MG tablet Take 1,000 mg by mouth every 6 (six) hours as needed for moderate pain or headache.       aspirin EC 81 MG tablet Take 81 mg by mouth daily. Swallow whole.       atorvastatin (LIPITOR) 10 MG tablet TAKE 1 TABLET BY MOUTH EVERY DAY (Patient taking differently: Take 10 mg by mouth daily.) 90 tablet 0   AURYXIA 1 GM 210 MG(Fe) tablet Take 420 mg by mouth 3 (three) times daily.       B Complex-C-Folic Acid (DIALYVITE 580) 0.8 MG TABS Take 0.8 mg by mouth daily.       blood glucose meter kit and supplies KIT Dispense based on patient and insurance preference. Use up to four times daily as directed. (FOR ICD-9 250.00, 250.01). For QAC - HS accuchecks. 1 each 1   brimonidine (ALPHAGAN) 0.15 % ophthalmic solution Place 1 drop into the right eye 3 (three) times daily.       carvedilol (COREG) 3.125 MG tablet TAKE 1 TABLET(3.125 MG) BY MOUTH TWICE DAILY WITH A MEAL (Patient taking differently: Take 3.125 mg by mouth 2 (two) times daily with a meal.) 180 tablet 1   clopidogrel (PLAVIX) 75 MG tablet Take 1 tablet (75 mg total) by mouth daily with breakfast. 30 tablet 5   dorzolamide-timolol (COSOPT) 22.3-6.8 MG/ML ophthalmic solution Place 1 drop into the right eye 2 (two) times daily.   11   gabapentin (NEURONTIN) 100 MG capsule Take 1 capsule (100 mg total) by mouth at bedtime. (Patient taking differently: Take 100-200 mg by mouth See admin instructions. Take 200 mg daily on Tues, Thurs, and Sat. Take  100 mg daily on Mon, Wed, Fri, and Sun.) 90 capsule 1   hydrocortisone cream 1 % Apply 1 application topically 3 (three) times daily as needed for itching. (Patient not taking: Reported on 08/08/2020)       hydroxypropyl methylcellulose / hypromellose (ISOPTO TEARS / GONIOVISC) 2.5 % ophthalmic solution Place 1 drop into the left eye as needed for dry eyes.       isosorbide mononitrate (IMDUR) 30 MG 24 hr tablet TAKE 1 TABLET(30 MG) BY MOUTH DAILY (Patient taking differently: Take 30 mg by mouth daily.) 90 tablet 1   Lancets (ONETOUCH DELICA PLUS [id number]) MISC         ONETOUCH VERIO test strip USE AS DIRECTED TO TEST FOUR TIMES A DAY 100 each 3   oxyCODONE-acetaminophen (PERCOCET) 5-325 MG tablet Take 1 tablet by mouth every 6 (six) hours as needed. 20 tablet 0   pantoprazole (PROTONIX) 40 MG tablet TAKE 1 TABLET BY MOUTH DAILY AT NOON 90 tablet 0   pilocarpine (PILOCAR) 4 % ophthalmic solution Place 1 drop into the right eye 4 (four) times daily.       ROCKLATAN 0.02-0.005 % SOLN Place 1 drop into the right eye at bedtime.        No current facility-administered medications for this visit.      No Known Allergies     REVIEW OF SYSTEMS:    '[X]'$  denotes positive finding, $RemoveBeforeDEI'[ ]'[id number]$  denotes negative finding Cardiac   Comments:  Chest pain or chest pressure:      Shortness of breath upon exertion:      Short of breath when lying flat:      Irregular heart rhythm:             Vascular  Pain in calf, thigh, or hip brought on by ambulation:      Pain in feet at night that wakes you up from your sleep:  x    Blood clot in your veins:      Leg swelling:             Pulmonary      Oxygen at home:      Productive cough:      Wheezing:             Neurologic      Sudden weakness in arms or legs:      Sudden numbness in arms or legs:      Sudden onset of difficulty speaking or slurred speech:      Temporary loss of vision in one eye:      Problems with dizziness:              Gastrointestinal      Blood in stool:      Vomited blood:             Genitourinary      Burning when urinating:      Blood in urine:             Psychiatric      Major depression:             Hematologic      Bleeding problems:      Problems with blood clotting too easily:             Skin      Rashes or ulcers:             Constitutional      Fever or chills:          PHYSICAL EXAMINATION:      Vitals:    08/09/20 0901  Weight: 161 lb (73 kg)  Height: $Remove'5\' 5"'CftIwts$  (1.651 m)    General:  WDWN in NAD; vital signs documented above Gait: Not observed HENT: WNL, normocephalic Pulmonary: normal non-labored breathing Cardiac: regular HR, Skin: without rashes Vascular Exam/Pulses: biphasic DP and peroneal Doppler signals, monophasic PT Extremities: with ischemic changes, with Gangrene , without cellulitis; with open wounds of 1-2 toe webspace with foul odor Musculoskeletal: no muscle wasting or atrophy       Neurologic: A&O X 3;  No focal weakness or paresthesias are detected Psychiatric:  The pt has Normal affect.                 ASSESSMENT/PLAN:: 80 y.o. 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  Detailed discussion with Dr. Carlis Abbott, the patient, the patient's son and daughter regarding surgical options, likelihood of healing, need for short-term rehab and likely skilled nursing facility placement.  He explained a transmetatarsal amputation is recommended due to ischemic changes of the toes and forefoot, with tissue breakdown and foul odor.  It was clearly stated that this would not resolve his heel pain.  A below the knee amputation would be required to control heel pain and it was explained there is a 30 to 40% chance the BKA would not heal.  Either of these options would require rehab and likely SNF placement.  The other option the family discussed was to not pursue further surgical  intervention.  The patient ultimately decided to proceed with left below the knee amputation.  We will make  plans for this on Friday, June 10 with Dr. Carlis Abbott.     Barbie Banner, PA-C [facility] [phone]   Clinic MD:   Dr. Carlis Abbott

## 2020-08-13 ENCOUNTER — Encounter (HOSPITAL_COMMUNITY): Payer: Self-pay | Admitting: Vascular Surgery

## 2020-08-13 LAB — CBC
HCT: 27.7 % — ABNORMAL LOW (ref 39.0–52.0)
Hemoglobin: 9 g/dL — ABNORMAL LOW (ref 13.0–17.0)
MCH: 32.8 pg (ref 26.0–34.0)
MCHC: 32.5 g/dL (ref 30.0–36.0)
MCV: 101.1 fL — ABNORMAL HIGH (ref 80.0–100.0)
Platelets: 230 10*3/uL (ref 150–400)
RBC: 2.74 MIL/uL — ABNORMAL LOW (ref 4.22–5.81)
RDW: 13.8 % (ref 11.5–15.5)
WBC: 11.7 10*3/uL — ABNORMAL HIGH (ref 4.0–10.5)
nRBC: 0 % (ref 0.0–0.2)

## 2020-08-13 LAB — GLUCOSE, CAPILLARY
Glucose-Capillary: 199 mg/dL — ABNORMAL HIGH (ref 70–99)
Glucose-Capillary: 167 mg/dL — ABNORMAL HIGH (ref 70–99)

## 2020-08-13 LAB — BASIC METABOLIC PANEL
Anion gap: 14 (ref 5–15)
BUN: 23 mg/dL (ref 8–23)
CO2: 29 mmol/L (ref 22–32)
Calcium: 9.2 mg/dL (ref 8.9–10.3)
Chloride: 93 mmol/L — ABNORMAL LOW (ref 98–111)
Creatinine, Ser: 5.98 mg/dL — ABNORMAL HIGH (ref 0.61–1.24)
GFR, Estimated: 9 mL/min — ABNORMAL LOW (ref >60)
Glucose, Bld: 182 mg/dL — ABNORMAL HIGH (ref 70–99)
Potassium: 4.4 mmol/L (ref 3.5–5.1)
Sodium: 136 mmol/L (ref 135–145)

## 2020-08-13 MED ORDER — LIDOCAINE HCL (PF) 1 % IJ SOLN: 5.0000 mL | INTRAMUSCULAR | Status: DC | PRN

## 2020-08-13 MED ORDER — CINACALCET HCL 30 MG PO TABS
60.0000 mg | ORAL_TABLET | ORAL | Status: DC
  Administered 2020-08-13 – 2020-08-16 (×2): 60 mg via ORAL
  Filled 2020-08-13 (×3): qty 2

## 2020-08-13 MED ORDER — DOXERCALCIFEROL 4 MCG/2ML IV SOLN
INTRAVENOUS | Status: AC
  Administered 2020-08-13: 8 ug via INTRAVENOUS
  Filled 2020-08-13: qty 4

## 2020-08-13 MED ORDER — SODIUM CHLORIDE 0.9 % IV BOLUS
250.0000 mL | Freq: Once | INTRAVENOUS | Status: AC
  Administered 2020-08-13: 250 mL via INTRAVENOUS

## 2020-08-13 MED ORDER — LIDOCAINE-PRILOCAINE 2.5-2.5 % EX CREA: 1.0000 "application " | TOPICAL_CREAM | CUTANEOUS | Status: DC | PRN

## 2020-08-13 MED ORDER — PENTAFLUOROPROP-TETRAFLUOROETH EX AERO: 1.0000 "application " | INHALATION_SPRAY | CUTANEOUS | Status: DC | PRN

## 2020-08-13 MED ORDER — SODIUM CHLORIDE 0.9 % IV SOLN: 100.0000 mL | INTRAVENOUS | Status: DC | PRN

## 2020-08-13 MED ORDER — SODIUM CHLORIDE 0.9 % IV BOLUS
500.0000 mL | Freq: Once | INTRAVENOUS | Status: AC
  Administered 2020-08-13: 500 mL via INTRAVENOUS

## 2020-08-13 MED ORDER — HEPARIN SODIUM (PORCINE) 1000 UNIT/ML DIALYSIS: 1000.0000 [IU] | INTRAMUSCULAR | Status: DC | PRN

## 2020-08-13 NOTE — Progress Notes (Addendum)
Pt having some intermittent confusion with some hallucination. (Seeing writing on the wall that isn't there, seeing someone standing in doorway that isn't there.) Pt answers orientation questions correctly. Pt is neuro intact.  BP is 92/57 (69).  CBG 167. No other symptoms currently.  Nephrologist and vascular surgeon both notified.  Verbal order for 572mL NS bolus given, then recheck BP, if systolic still <271 then give another 250 NS bolus.  Vascular MD in route to assess Pt in room.

## 2020-08-13 NOTE — Progress Notes (Signed)
Inpatient Rehab Admissions Coordinator:   CIR consult received. Pt. S/p BKA, has not worked with PT/OT yet. I await therapy notes with recommendations before determining CIR candidacy. Note that [facility] Medicare, Pt.'s insurer, typically does not approve CIR for amputations.   Clemens Catholic, [place], [place] Admissions Coordinator  [phone] ([place]) [phone] (office)

## 2020-08-13 NOTE — TOC Initial Note (Signed)
Transition of Care [facility]) - Initial/Assessment Note    Patient Details  Name: Elijah White MRN: [id number] Date of Birth: 1940/06/17  Transition of Care [facility]) CM/SW Contact:    Bary Castilla, LCSW Phone Number:[phone] 08/13/2020, 3:14 PM  Clinical Narrative:    CSW met with pt, pt' son and pt's daughter [name] who was via phone. Family wanted to discuss discharge options for pt. CSW explained that PT had recommended that pt go to SNF. [name] explained that the family is allow wanting to be placed at a LTC after SNF.  CSW had a lengthy conversation with family in regards to process and the next steps. CSW directed them to the medicare.[place] Kitchengov web site to look at the ratings. CSW shared that family's preference of seeking a LTC bed could also be noted on the referral.  [name] shared that pt HD clinic is [place] on [address] and chair time is around 10:00am. CSW explained that this would also be included on referral.  CSW received several SNFs from [name] that she wanted referral sent. [name] mentioned a preference [facility] because she has heard good things about the facility.                  CSW received permission to fax out to facilities in [place], [place] and [place]. Pt is vaccinated ad booster. Pt informed CSW that he wants [name] to make the decision.  TOC team will continue to assist with discharge planning needs.    Expected Discharge Plan: Skilled Nursing Facility Barriers to Discharge: Continued Medical Work up, SNF Pending bed offer   Patient Goals and CMS Choice   CMS Medicare.gov Compare Post Acute Care list provided to:: Patient Represenative (must comment) (website given to daughter via phone) Choice offered to / list presented to : Adult Children  Expected Discharge Plan and Services Expected Discharge Plan: [place]       Living arrangements for the past 2 months: Apartment                                      Prior Living  Arrangements/Services Living arrangements for the past 2 months: Apartment Lives with:: Self Patient language and need for interpreter reviewed:: Yes          Care giver support system in place?: Yes (comment)      Activities of Daily Living Home Assistive Devices/Equipment: Gilford Rile (specify type), Grab bars in shower, Grab bars around toilet ADL Screening (condition at time of admission) Patient's cognitive ability adequate to safely complete daily activities?: Yes Is the patient deaf or have difficulty hearing?: No Does the patient have difficulty seeing, even when wearing glasses/contacts?: Yes Does the patient have difficulty concentrating, remembering, or making decisions?: No Patient able to express need for assistance with ADLs?: Yes Does the patient have difficulty dressing or bathing?: Yes Independently performs ADLs?: Yes (appropriate for developmental age) Does the patient have difficulty walking or climbing stairs?: Yes Weakness of Legs: Both Weakness of Arms/Hands: None  Permission Sought/Granted   Permission granted to share information with : Yes, Verbal Permission Granted  Share Information with NAME: [name]  Permission granted to share info w AGENCY: SNFs  Permission granted to share info w Relationship: Daughter  Permission granted to share info w Contact Information: [phone]  Emotional Assessment Appearance:: Appears stated age Attitude/Demeanor/Rapport: Engaged Affect (typically observed): Accepting Orientation: :  Oriented to Self, Oriented to Place, Oriented to  Time, Oriented to Situation      Admission diagnosis:  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, 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[place]) [I70.269] 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, 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[place]) 08/12/2020   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   Acute on chronic diastolic heart failure ([place]) 01/19/2020   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   RBBB    Near  syncope 12/13/2019   Severe aortic stenosis    ESRD (end stage renal disease) ([place]) 11/26/2019   Macrocytic anemia 11/26/2019   Adrenal mass ([place]) 11/26/2019   Colonic mass 11/26/2019   ARF (acute renal failure) ([place]) 03/25/2018   Vision loss of right eye 11/11/2017   Bilateral pseudophakia 07/11/2017   GERD (gastroesophageal reflux disease) 09/10/2016   HLD (hyperlipidemia) 09/10/2016   Essential hypertension 08/13/2016   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   Type 2 diabetes mellitus with renal manifestations ([place]) 10/22/2006   BPH (benign prostatic hyperplasia) 10/22/2006   COLONIC POLYPS, HX OF 10/22/2006   PCP:  [name], MD Pharmacy:   [facility] [place] - [phone] [place] [address] Phone: [phone] Fax: [phone]     Social Determinants of Health (SDOH) Interventions    Readmission Risk Interventions No flowsheet data found.

## 2020-08-13 NOTE — Progress Notes (Signed)
[facility] Progress Note  Subjective: no c/o , seen on HD  Vitals:   08/13/20 1100 08/13/20 1130 08/13/20 1151 08/13/20 1240  BP: 120/61 103/66 128/62 (!) 123/59  Pulse: 63 77 88 89  Resp:   17 18  Temp:   (!) 97.1 F (36.2 C) 98 F (36.7 C)  TempSrc:   Oral Oral  SpO2:   98% 99%  Weight:   70.4 kg   Height:        Exam:  alert, nad   no jvd  Chest cta bilat  Cor reg no RG  Abd soft ntnd no ascites   Ext no LE edema, new L BKA   Alert, NF, ox3   LUE AVF+bruit    OP HD: [place] [facility]TS  4h 72.5kg  2/2 bath  LUE AVF  Hep none  - venofer 100 each rx  - hectorol 8ug tiw  - sensipar 60mg  po w/ hd   Assessment/ Plan: 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 Per vascular surgeon. ESRD: TTS schedule. For HD today. No heparin.  Reassess dry weight after amputation. Hypertension: on carvedilol and isosorbide, BP's good.  Anemia of ESRD: Hemoglobin at goal. Metabolic Bone Disease: Continue Hectorol, sensipar w hd. Auryxia w/ meals.  Monitor calcium phosphorus level.     Rob Doctor, hospital 08/13/2020, 1:35 PM   Recent Labs  Lab 08/12/20 0930 08/13/20 0131  K 3.6 4.4  BUN 15 23  CREATININE 4.90* 5.98*  CALCIUM  --  9.2  HGB 10.9* 9.0*   Inpatient medications:  acetaminophen  500 mg Oral Q6H   aspirin EC  81 mg Oral q AM   atorvastatin  10 mg Oral q AM   brimonidine  1 drop Right Eye TID   carvedilol  3.125 mg Oral BID WC   Chlorhexidine Gluconate Cloth  6 each Topical Q0600   clopidogrel  75 mg Oral Q breakfast   docusate sodium  100 mg Oral q AM   dorzolamide-timolol  1 drop Right Eye BID   doxercalciferol  8 mcg Intravenous Q T,Th,Sa-HD   ferric citrate  420 mg Oral TID WC   gabapentin  100 mg Oral QHS   isosorbide mononitrate  30 mg Oral q AM   multivitamin  1 tablet Oral QHS   Netarsudil-Latanoprost  1 drop Right Eye QHS   pantoprazole  40 mg Oral q AM   pilocarpine  1 drop Right Eye QID    acetaminophen, bisacodyl,  guaiFENesin-dextromethorphan, hydrALAZINE, HYDROcodone-acetaminophen, HYDROcodone-acetaminophen, hydroxypropyl methylcellulose / hypromellose, labetalol, metoprolol tartrate, morphine injection, ondansetron, phenol, polyethylene glycol

## 2020-08-13 NOTE — NC FL2 (Signed)
[place] LEVEL OF CARE SCREENING TOOL     IDENTIFICATION  Patient Name: Elijah White Birthdate: 07-01-1940 Sex: male Admission Date (Current Location): 08/12/2020  [facility] and [place] Number:  Herbalist and Address:  [facility] 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, 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[place]) 08/12/2020   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   Acute on chronic diastolic heart failure ([place]) 01/19/2020   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   RBBB    Near syncope 12/13/2019   Severe aortic stenosis    ESRD (end stage renal disease) ([place]) 11/26/2019   Macrocytic anemia 11/26/2019   Adrenal mass ([place]) 11/26/2019   Colonic mass 11/26/2019   ARF (acute renal failure) ([place]) 03/25/2018   Vision loss of right eye 11/11/2017   Bilateral pseudophakia 07/11/2017   GERD (gastroesophageal reflux disease) 09/10/2016   HLD (hyperlipidemia) 09/10/2016   Essential hypertension 08/13/2016   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   Type 2 diabetes mellitus with renal manifestations ([place]) 10/22/2006   BPH (benign prostatic hyperplasia) 10/22/2006   COLONIC POLYPS, HX OF 10/22/2006    Orientation RESPIRATION BLADDER Height & Weight     Self, Time, Situation, Place  Normal Incontinent Weight: 155 lb 3.3 oz (70.4 kg) Height:  5\' 5"  (165.1 cm)  BEHAVIORAL SYMPTOMS/MOOD NEUROLOGICAL BOWEL NUTRITION STATUS      Incontinent Diet (See discharge summary)  AMBULATORY STATUS COMMUNICATION OF NEEDS  Skin   Limited Assist Verbally Surgical wounds (left BK)                       Personal Care Assistance Level of Assistance  Bathing, Feeding, Dressing Bathing Assistance: Limited assistance Feeding assistance: Independent Dressing Assistance: Limited assistance     Functional Limitations Info  Sight, Hearing, Speech Sight Info: Impaired Hearing Info: Adequate Speech Info: Adequate    SPECIAL CARE FACTORS FREQUENCY  PT (By licensed PT), OT (By licensed OT)     PT Frequency: 5x per week OT Frequency: 5x per week            Contractures Contractures Info: Not present    Additional Factors Info  Code Status, Allergies Code Status Info: DNR Allergies Info: NKA           Current Medications (08/13/2020):  This is the current hospital active medication list Current Facility-Administered Medications  Medication Dose Route Frequency Provider Last Rate Last Admin   acetaminophen (TYLENOL) tablet 325-650 mg  325-650 mg Oral Q6H PRN Setzer, Ed[address] Circle, PA-C       acetaminophen (TYLENOL) tablet 500 mg  500 mg Oral Q6H Barbie Banner, PA-C   500 mg at 08/13/20 0516   aspirin EC tablet 81 mg  81 mg Oral q AM Setzer[address]man Circle, PA-C   81 mg at 08/13/20 1310   atorvastatin (LIPITOR) tablet 10 mg  10 mg Oral q AM Barbie Banner, PA-C   10 mg at 08/13/20 1324   bisacodyl (DULCOLAX) EC tablet 5 mg  5  mg Oral Daily PRN Barbie Banner, PA-C       brimonidine (ALPHAGAN) 0.15 % ophthalmic solution 1 drop  1 drop Right Eye TID Barbie Banner, PA-C   1 drop at 08/12/20 2101   carvedilol (COREG) tablet 3.125 mg  3.125 mg Oral BID WC Barbie Banner, PA-C   3.125 mg at 08/12/20 1840   Chlorhexidine Gluconate Cloth 2 % PADS 6 each  6 each Topical Q0600 Rosita Fire, MD   6 each at 08/13/20 0516   cinacalcet (SENSIPAR) tablet 60 mg  60 mg Oral Q T,Th,Sat-1800 Roney Jaffe, MD       clopidogrel (PLAVIX) tablet 75 mg  75 mg Oral Q breakfast Barbie Banner, PA-C   75 mg at  08/13/20 1309   docusate sodium (COLACE) capsule 100 mg  100 mg Oral q AM Barbie Banner, PA-C   100 mg at 08/13/20 1310   dorzolamide-timolol (COSOPT) 22.3-6.8 MG/ML ophthalmic solution 1 drop  1 drop Right Eye BID Barbie Banner, PA-C   1 drop at 08/13/20 1330   doxercalciferol (HECTOROL) injection 8 mcg  8 mcg Intravenous Q T,Th,Sa-HD Rosita Fire, MD   8 mcg at 08/13/20 1106   ferric citrate (AURYXIA) tablet 420 mg  420 mg Oral TID WC Barbie Banner, PA-C   420 mg at 08/13/20 1325   gabapentin (NEURONTIN) capsule 100 mg  100 mg Oral QHS Barbie Banner, PA-C   100 mg at 08/12/20 2101   guaiFENesin-dextromethorphan (ROBITUSSIN DM) 100-10 MG/5ML syrup 15 mL  15 mL Oral Q4H PRN Barbie Banner, PA-C       hydrALAZINE (APRESOLINE) injection 5 mg  5 mg Intravenous Q20 Min PRN Barbie Banner, PA-C       HYDROcodone-acetaminophen (NORCO) 7.5-325 MG per tablet 1-2 tablet  1-2 tablet Oral Q4H PRN Barbie Banner, PA-C   2 tablet at 08/12/20 2100   HYDROcodone-acetaminophen (NORCO/VICODIN) 5-325 MG per tablet 1-2 tablet  1-2 tablet Oral Q4H PRN Barbie Banner, PA-C   2 tablet at 08/13/20 1308   hydroxypropyl methylcellulose / hypromellose (ISOPTO TEARS / GONIOVISC) 2.5 % ophthalmic solution 1 drop  1 drop Left Eye PRN Barbie Banner, PA-C       isosorbide mononitrate (IMDUR) 24 hr tablet 30 mg  30 mg Oral q AM Setzer, Edman Circle, PA-C   30 mg at 08/13/20 1310   labetalol (NORMODYNE) injection 10 mg  10 mg Intravenous Q10 min PRN Barbie Banner, PA-C       metoprolol tartrate (LOPRESSOR) injection 2-5 mg  2-5 mg Intravenous Q2H PRN Setzer, [address] Circle, PA-C       morphine 2 MG/ML injection 0.5-1 mg  0.5-1 mg Intravenous Q2H PRN Barbie Banner, PA-C       multivitamin (RENA-VIT) tablet 1 tablet  1 tablet Oral QHS Barbie Banner, PA-C   1 tablet at 08/12/20 2101   Netarsudil-Latanoprost 0.02-0.005 % SOLN 1 drop  1 drop Right Eye QHS Setzer, Edman Circle, PA-C       ondansetron [facility])  injection 4 mg  4 mg Intravenous Q6H PRN Setzer, [address] Circle, PA-C       pantoprazole (PROTONIX) EC tablet 40 mg  40 mg Oral q AM Barbie Banner, PA-C   40 mg at 08/13/20 1308   phenol (CHLORASEPTIC) mouth spray 1 spray  1 spray Mouth/Throat PRN Setzer, [address] Circle, PA-C       pilocarpine (PILOCAR) 4 %  ophthalmic solution 1 drop  1 drop Right Eye QID Barbie Banner, PA-C   1 drop at 08/13/20 1330   polyethylene glycol (MIRALAX / GLYCOLAX) packet 17 g  17 g Oral Daily PRN Barbie Banner, PA-C         Discharge Medications: Please see discharge summary for a list of discharge medications.  Relevant Imaging Results:  Relevant Lab Results:   Additional Information SSN# [id number] pt vaccinated and one booster HD [facility] chair time 10:00am T- [place], LCSW

## 2020-08-13 NOTE — Progress Notes (Addendum)
  Progress Note    08/13/2020 9:39 AM 1 Day Post-Op  Subjective:  Seen on HD this morning; Minimal pain overnight   Vitals:   08/13/20 0858 08/13/20 0929  BP: (!) 107/53 (!) 116/57  Pulse: 62 62  Resp:    Temp:    SpO2:      Physical Exam: Incisions:  L AKA dressing dry   CBC    Component Value Date/Time   WBC 11.7 (H) 08/13/2020 0131   RBC 2.74 (L) 08/13/2020 0131   HGB 9.0 (L) 08/13/2020 0131   HGB 12.7 (L) 02/19/2018 1014   HCT 27.7 (L) 08/13/2020 0131   HCT 38.7 02/19/2018 1014   PLT 230 08/13/2020 0131   PLT 230 02/19/2018 1014   MCV 101.1 (H) 08/13/2020 0131   MCV 95 02/19/2018 1014   MCH 32.8 08/13/2020 0131   MCHC 32.5 08/13/2020 0131   RDW 13.8 08/13/2020 0131   RDW 13.6 02/19/2018 1014   LYMPHSABS 1.4 06/24/2020 1731   MONOABS 0.8 06/24/2020 1731   EOSABS 0.1 06/24/2020 1731   BASOSABS 0.0 06/24/2020 1731    BMET    Component Value Date/Time   NA 136 08/13/2020 0131   NA 134 (A) 09/11/2019 0000   K 4.4 08/13/2020 0131   CL 93 (L) 08/13/2020 0131   CO2 29 08/13/2020 0131   GLUCOSE 182 (H) 08/13/2020 0131   BUN 23 08/13/2020 0131   BUN 34 (A) 09/11/2019 0000   CREATININE 5.98 (H) 08/13/2020 0131   CALCIUM 9.2 08/13/2020 0131   GFRNONAA 9 (L) 08/13/2020 0131   GFRAA 7 (L) 12/03/2019 0710    INR    Component Value Date/Time   INR 1.0 01/14/2020 0844     Intake/Output Summary (Last 24 hours) at 08/13/2020 0939 Last data filed at 08/12/2020 1400 Gross per 24 hour  Intake 450 ml  Output 100 ml  Net 350 ml     Assessment/Plan:  80 y.o. male is s/p left above knee amputation  1 Day Post-Op  - L AKA dressing dry; dressing change tomorrow - ESRD on HD per Nephrology - PT/OT eval today   Dagoberto Ligas, PA-C [facility] [phone] 08/13/2020 9:39 AM   VASCULAR STAFF ADDENDUM: I agree with the above.   Yevonne Aline. Stanford Breed, MD [facility] Phone Number: [phone] 08/13/2020 10:38 AM

## 2020-08-13 NOTE — Progress Notes (Signed)
Pt complained of moderate pain in midsternum area slightly deviated to left.  Pt described pain as aching with occasional sharpness, 6 out of 10.  All vitals within normal range.  No other symptoms to report.  EKG performed showing normal sinus rhythm with a Right Bundle Branch Block.  PA notified. Pain medication given. Pt will be monitored.

## 2020-08-13 NOTE — Evaluation (Signed)
Physical Therapy Evaluation Patient Details Name: Elijah White MRN: [id number] DOB: 07/13/1940 Today's Date: 08/13/2020   History of Present Illness  pt is an 80 y/o male presenting 6/10 for follow up of left foot pain at rest after recent stenting and angioplasty of the left SFA.  Pt now s/p L transtibial amputation.  PMHx:  Aortic stenosis s/p TAVR, prostate CA, DM2, ESRD, Glaucoma, HTN, legal blindness L eye, PAD  Clinical Impression  Pt admitted with/for L BKA for reasons stated above.  Pt not feeling well post HD today and needing Moderate A for basic bed mobility.  Pt deferred transfers.  Pt currently limited functionally due to the problems listed. ( See problems list.)   Pt will benefit from PT to maximize function and safety in order to get ready for next venue listed below.     Follow Up Recommendations SNF    Equipment Recommendations  Wheelchair (measurements PT);Wheelchair cushion (measurements PT) (3 in 1 .  RW as pt improves.)    Recommendations for Other Services       Precautions / Restrictions Precautions Precautions: Fall      Mobility  Bed Mobility Overal bed mobility: Needs Assistance Bed Mobility: Supine to Sit;Sit to Supine     Supine to sit: Mod assist Sit to supine: Min assist   General bed mobility comments: minimal assist for bridging and repositioning, truncal assist up/down from supine.    Transfers                 General transfer comment: NT, pt deferred  Ambulation/Gait                Stairs            Wheelchair Mobility    Modified Rankin (Stroke Patients Only)       Balance Overall balance assessment: Needs assistance Sitting-balance support: Feet supported;Single extremity supported;Bilateral upper extremity supported Sitting balance-Leahy Scale: Poor Sitting balance - Comments: pt needing UE assist for balance, not feeling well                                     Pertinent  Vitals/Pain Pain Assessment: Faces Faces Pain Scale: Hurts little more Pain Location: L LE Pain Descriptors / Indicators: Sore Pain Intervention(s): Monitored during session    Home Living Family/patient expects to be discharged to:: Skilled nursing facility Living Arrangements: Alone (Likely for LTC now per his son.) Available Help at Discharge: Family;Available PRN/intermittently Type of Home: Apartment Home Access: Stairs to enter     Home Layout: One level        Prior Function Level of Independence: Independent with assistive device(s)         Comments: Was independent in his home, the family has now decided together that pt is going to SNF LTC     Hand Dominance        Extremity/Trunk Assessment   Upper Extremity Assessment Upper Extremity Assessment: Generalized weakness    Lower Extremity Assessment Lower Extremity Assessment: Generalized weakness;LLE deficits/detail LLE Deficits / Details: painful, assisted through full range, knee flexion >90* LLE Coordination: decreased fine motor    Cervical / Trunk Assessment Cervical / Trunk Assessment: Normal  Communication   Communication: No difficulties  Cognition Arousal/Alertness: Awake/alert Behavior During Therapy: Agitated Overall Cognitive Status: No family/caregiver present to determine baseline cognitive functioning (calling out, appears mildly confused, but suspect is quite agitated.)  General Comments General comments (skin integrity, edema, etc.): Pt with c/o not feeling well after HD, "worst ever"  BP supine  115/69 and in sitting 3-5 min later at EOB BP 123/59.  Pt reporting L chest tightness.  RN made aware. Son arrived as I was leaving and wanted info on SNF for medicaid pt's.  Case mgr made aware.    Exercises Amputee Exercises Hip Extension: AROM;Strengthening;Left;10 reps Hip ABduction/ADduction: AROM;Strengthening;Both;10 reps Hip  Flexion/Marching: AROM;Strengthening;Left;10 reps Knee Flexion: AROM;Strengthening;Left;10 reps Knee Extension: AROM;Strengthening;Left;10 reps   Assessment/Plan    PT Assessment Patient needs continued PT services  PT Problem List Decreased strength;Decreased activity tolerance;Decreased balance;Decreased mobility;Decreased knowledge of use of DME;Decreased knowledge of precautions;Pain       PT Treatment Interventions DME instruction;Gait training;Functional mobility training;Therapeutic activities;Therapeutic exercise;Patient/family education;Balance training    PT Goals (Current goals can be found in the Care Plan section)  Acute Rehab PT Goals Patient Stated Goal: move better, by myself PT Goal Formulation: With patient Time For Goal Achievement: 08/27/20 Potential to Achieve Goals: Fair    Frequency Min 3X/week   Barriers to discharge        Co-evaluation               AM-PAC PT "6 Clicks" Mobility  Outcome Measure Help needed turning from your back to your side while in a flat bed without using bedrails?: A Lot Help needed moving from lying on your back to sitting on the side of a flat bed without using bedrails?: A Lot Help needed moving to and from a bed to a chair (including a wheelchair)?: A Lot Help needed standing up from a chair using your arms (e.g., wheelchair or bedside chair)?: A Lot Help needed to walk in hospital room?: A Lot Help needed climbing 3-5 steps with a railing? : Total 6 Click Score: 11    End of Session   Activity Tolerance: Patient tolerated treatment well;Patient limited by fatigue Patient left: in bed;with call bell/phone within reach;with nursing/sitter in room;with family/visitor present Nurse Communication: Mobility status PT Visit Diagnosis: Other abnormalities of gait and mobility (R26.89);Muscle weakness (generalized) (M62.81);Pain Pain - Right/Left:  (L LE) Pain - part of body: Leg    Time: 1226-1249 PT Time Calculation  (min) (ACUTE ONLY): 23 min   Charges:   PT Evaluation $PT Eval Moderate Complexity: 1 Mod PT Treatments $Therapeutic Activity: 8-22 mins        08/13/2020  Ginger Carne., PT Acute Rehabilitation Services [phone]  (pager) [phone]  (office)  Tessie Fass Wakisha Alberts 08/13/2020, 1:28 PM

## 2020-08-13 NOTE — Progress Notes (Signed)
Patient confused and somnolent after dialysis today. Nonfocal on exam. Appropriate answers to orientation questions. Suspect he had too much fluid taken off at dialysis. He had symptomatic improvement after a IV fluid bolus.  Will plan to observe for now.  Yevonne Aline. Stanford Breed, MD [facility] Phone Number: [phone] 08/13/2020 7:45 PM

## 2020-08-13 NOTE — Progress Notes (Signed)
OT Cancellation Note  Patient Details Name: Elijah White MRN: [id number] DOB: 1940/09/27   Cancelled Treatment:    Reason Eval/Treat Not Completed: Patient at procedure or test/ unavailable- pt off unit in HD. Will follow and see as able.   Jolaine Artist, OT Acute Rehabilitation Services Pager [phone] Office [phone]   [name] 08/13/2020, 8:01 AM

## 2020-08-13 NOTE — Progress Notes (Signed)
Inpatient Rehab Admissions Coordinator:   Note PT is recommending SNF and TOC note mentions family was trying to pursue LTC placement for Pt. Prior to admission. I will not pursue CIR admit for this pt.   Clemens Catholic, [place], [place] Admissions Coordinator  [phone] ([place]) [phone] (office)

## 2020-08-14 ENCOUNTER — Encounter (HOSPITAL_COMMUNITY): Payer: Self-pay | Admitting: Vascular Surgery

## 2020-08-14 LAB — CBC
HCT: 28.2 % — ABNORMAL LOW (ref 39.0–52.0)
Hemoglobin: 8.9 g/dL — ABNORMAL LOW (ref 13.0–17.0)
MCH: 32.5 pg (ref 26.0–34.0)
MCHC: 31.6 g/dL (ref 30.0–36.0)
MCV: 102.9 fL — ABNORMAL HIGH (ref 80.0–100.0)
Platelets: 217 10*3/uL (ref 150–400)
RBC: 2.74 MIL/uL — ABNORMAL LOW (ref 4.22–5.81)
RDW: 13.9 % (ref 11.5–15.5)
WBC: 10.9 10*3/uL — ABNORMAL HIGH (ref 4.0–10.5)
nRBC: 0 % (ref 0.0–0.2)

## 2020-08-14 LAB — BASIC METABOLIC PANEL
Anion gap: 9 (ref 5–15)
BUN: 16 mg/dL (ref 8–23)
CO2: 28 mmol/L (ref 22–32)
Calcium: 9.1 mg/dL (ref 8.9–10.3)
Chloride: 99 mmol/L (ref 98–111)
Creatinine, Ser: 3.96 mg/dL — ABNORMAL HIGH (ref 0.61–1.24)
GFR, Estimated: 14 mL/min — ABNORMAL LOW (ref >60)
Glucose, Bld: 159 mg/dL — ABNORMAL HIGH (ref 70–99)
Potassium: 3.6 mmol/L (ref 3.5–5.1)
Sodium: 136 mmol/L (ref 135–145)

## 2020-08-14 LAB — GLUCOSE, CAPILLARY
Glucose-Capillary: 159 mg/dL — ABNORMAL HIGH (ref 70–99)
Glucose-Capillary: 141 mg/dL — ABNORMAL HIGH (ref 70–99)

## 2020-08-14 NOTE — Progress Notes (Signed)
Mobility Specialist: Progress Note   08/14/20 1438  Mobility  Activity Transferred to/from [facility];Transferred:  Bed to chair  Level of Assistance Moderate assist, patient does 50-74%  Assistive Device Stedy  Mobility Out of bed to chair with meals;Out of bed for toileting  Mobility Response Tolerated well  Mobility performed by Mobility specialist  Bed Position Chair  $Mobility charge 1 Mobility   Pre-Mobility: 83 HR, 97% SpO2 Post-Mobility: 93 HR, 94% SpO2  Pt was minA to modA to sit EOB from supine and pt was modA to stand at [place]. Pt does well with pulling himself up but has a tendency to want to sit early. Pt required frequent verbal cues to stand tall and to push through his RLE to remain standing once up. Pt to [place], unsuccessful, and then agreeable to sit in the recliner. Pt is in the recliner with call bell in his lap.   [name] Mobility Specialist Mobility Specialist Phone: [phone]

## 2020-08-14 NOTE — Progress Notes (Signed)
[facility] Progress Note  Subjective: no c/o today, leg not hurting this am.   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 Per vascular surgeon. ESRD: TTS schedule. Next HD 6/14.  BP/ volume - had hypotension/ confusion after HD last night, improved w/ 500 cc NS bolus. No vol excess on exam, looks a bit dry. Will avoid sig UF w/ next session.  Hypertension: on low-dose carvedilol and isosorbide, BP's low-normal today.  Hx of AS - sp TAVR Nov 2021 Anemia of ESRD: Hemoglobin 11 > 9 > 8.9 today. Was not getting esa at OP unit. Follow.  Metabolic Bone Disease: Continue Hectorol, sensipar w hd. Auryxia w/ meals.  Monitor calcium phosphorus level.     Rob Doctor, hospital 08/14/2020, 7:45 AM   Recent Labs  Lab 08/13/20 0131 08/14/20 0106  K 4.4 3.6  BUN 23 16  CREATININE 5.98* 3.96*  CALCIUM 9.2 9.1  HGB 9.0* 8.9*    Inpatient medications:  aspirin EC  81 mg Oral q AM   atorvastatin  10 mg Oral q AM   brimonidine  1 drop Right Eye TID   carvedilol  3.125 mg Oral BID WC   Chlorhexidine Gluconate Cloth  6 each Topical Q0600   cinacalcet  60 mg Oral Q T,Th,Sat-1800   clopidogrel  75 mg Oral Q breakfast   docusate sodium  100 mg Oral q AM   dorzolamide-timolol  1 drop Right Eye BID   doxercalciferol  8 mcg Intravenous Q T,Th,Sa-HD   ferric citrate  420 mg Oral TID WC   gabapentin  100 mg Oral QHS    isosorbide mononitrate  30 mg Oral q AM   multivitamin  1 tablet Oral QHS   Netarsudil-Latanoprost  1 drop Right Eye QHS   pantoprazole  40 mg Oral q AM   pilocarpine  1 drop Right Eye QID    acetaminophen, bisacodyl, guaiFENesin-dextromethorphan, hydrALAZINE, HYDROcodone-acetaminophen, HYDROcodone-acetaminophen, hydroxypropyl methylcellulose / hypromellose, labetalol, metoprolol tartrate, morphine injection, ondansetron, phenol, polyethylene glycol

## 2020-08-14 NOTE — Progress Notes (Signed)
Mobility Specialist: Progress Note   08/14/20 1548  Mobility  Activity Transferred:  Chair to bed  Level of Assistance Moderate assist, patient does 50-74%  Assistive Device Stedy  Mobility Out of bed to chair with meals  Mobility Response Tolerated well  Mobility performed by Mobility specialist  $Mobility charge 1 Mobility   Pt assisted back to bed per request using Stedy. Pt was modA to stand still requiring VC to stand tall and to push through his RLE. Pt is back in the bed with bed alarm on.   [facility] Nyonna Hargrove Mobility Specialist Mobility Specialist Phone: [phone]

## 2020-08-14 NOTE — Progress Notes (Addendum)
  Progress Note    08/14/2020 8:16 AM 2 Days Post-Op  Subjective:  Alert and oriented this morning.  Minimal pain L BKA   Vitals:   08/13/20 2342 08/14/20 0337  BP: 118/66 115/62  Pulse: 85 78  Resp:    Temp:  97.7 F (36.5 C)  SpO2: 99% 100%   Physical Exam: Lungs:  non labored Incisions:  L BKA incision c/d/i Neurologic: A&O  CBC    Component Value Date/Time   WBC 10.9 (H) 08/14/2020 0106   RBC 2.74 (L) 08/14/2020 0106   HGB 8.9 (L) 08/14/2020 0106   HGB 12.7 (L) 02/19/2018 1014   HCT 28.2 (L) 08/14/2020 0106   HCT 38.7 02/19/2018 1014   PLT 217 08/14/2020 0106   PLT 230 02/19/2018 1014   MCV 102.9 (H) 08/14/2020 0106   MCV 95 02/19/2018 1014   MCH 32.5 08/14/2020 0106   MCHC 31.6 08/14/2020 0106   RDW 13.9 08/14/2020 0106   RDW 13.6 02/19/2018 1014   LYMPHSABS 1.4 06/24/2020 1731   MONOABS 0.8 06/24/2020 1731   EOSABS 0.1 06/24/2020 1731   BASOSABS 0.0 06/24/2020 1731    BMET    Component Value Date/Time   NA 136 08/14/2020 0106   NA 134 (A) 09/11/2019 0000   K 3.6 08/14/2020 0106   CL 99 08/14/2020 0106   CO2 28 08/14/2020 0106   GLUCOSE 159 (H) 08/14/2020 0106   BUN 16 08/14/2020 0106   BUN 34 (A) 09/11/2019 0000   CREATININE 3.96 (H) 08/14/2020 0106   CALCIUM 9.1 08/14/2020 0106   GFRNONAA 14 (L) 08/14/2020 0106   GFRAA 7 (L) 12/03/2019 0710    INR    Component Value Date/Time   INR 1.0 01/14/2020 0844     Intake/Output Summary (Last 24 hours) at 08/14/2020 0816 Last data filed at 08/13/2020 1151 Gross per 24 hour  Intake --  Output 1800 ml  Net -1800 ml     Assessment/Plan:  80 y.o. male is s/p L BKA 2 Days Post-Op   L BKA incision unremarkable; retention sock ordered PT/OT recommending SNF; TOC consulted ESRD on HD per [facility] Home when placement arranged   Dagoberto Ligas, PA-C [facility] [phone] 08/14/2020 8:16 AM  VASCULAR STAFF ADDENDUM: I have independently interviewed and examined the  patient. I agree with the above.  Looks good s/p L BKA. Ready for next level of care.  Yevonne Aline. Stanford Breed, MD [facility] Phone Number: [phone] 08/14/2020 9:04 AM

## 2020-08-14 NOTE — Evaluation (Signed)
Occupational Therapy Evaluation Patient Details Name: Elijah White MRN: [id number] DOB: 15-Feb-1941 Today's Date: 08/14/2020    History of Present Illness pt is an 80 y/o male presenting 6/10 for follow up of left foot pain at rest after recent stenting and angioplasty of the left SFA.  Pt now s/p L transtibial amputation.  PMHx:  Aortic stenosis s/p TAVR, prostate CA, DM2, ESRD, Glaucoma, HTN, legal blindness L eye, PAD   Clinical Impression   This 80 yo male admitted and underwent above presents to acute OT with PLOF of being independent to Mod I for basic ADls in his own home despite his great visual deficits. Currently he is min A-total A for all basic ADLs and min A -unable with +1 A for mobility. He will continue to benefit from acute OT with follow up at SNF.    Follow Up Recommendations  SNF;Supervision/Assistance - 24 hour    Equipment Recommendations  Other (comment) (TBD next venue)       Precautions / Restrictions Precautions Precautions: Fall Restrictions Weight Bearing Restrictions: Yes LLE Weight Bearing: Non weight bearing      Mobility Bed Mobility Overal bed mobility: Needs Assistance Bed Mobility: Supine to Sit;Sit to Supine;Rolling Rolling: Min assist   Supine to sit: Mod assist;HOB elevated Sit to supine: Min assist   General bed mobility comments: Pt needs consisent VCs for sequencing due to legally blind               General transfer comment: Attmepted sit<>stand, squat pivot and lateral scoot to recliner but each time pt teneded to lean posteriorly    Balance Overall balance assessment: Needs assistance Sitting-balance support: Bilateral upper extremity supported (RLE supported) Sitting balance-Leahy Scale: Poor         Standing balance comment: Tended to lean posteriorly with any attempt at transfer                           ADL either performed or assessed with clinical judgement   ADL Overall ADL's : Needs  assistance/impaired Eating/Feeding: Minimal assistance;Bed level Eating/Feeding Details (indicate cue type and reason): or supported sitting, pt is legally blind so needs setup and orientation to where items are as well as intermittent VCs Grooming: Bed level;Wash/dry face;Supervision/safety;Set up   Upper Body Bathing: Minimal assistance;Bed level   Lower Body Bathing: Total assistance;Bed level   Upper Body Dressing : Total assistance;Bed level   Lower Body Dressing: Total assistance;Bed level                       Vision Baseline Vision/History: Legally blind Additional Comments: Left eye worse than right eye            Pertinent Vitals/Pain Pain Assessment: Faces Faces Pain Scale: Hurts a little bit Pain Location: L LE, when laying back down from sitting EOB. Pain Descriptors / Indicators: Sore Pain Intervention(s): Limited activity within patient's tolerance;Monitored during session;Repositioned     Hand Dominance Right   Extremity/Trunk Assessment Upper Extremity Assessment Upper Extremity Assessment: Generalized weakness           Communication Communication Communication: No difficulties   Cognition Arousal/Alertness: Awake/alert Behavior During Therapy: Flat affect Overall Cognitive Status: No family/caregiver present to determine baseline cognitive functioning  Home Living Family/patient expects to be discharged to:: Skilled nursing facility                                             OT Problem List: Decreased strength;Decreased activity tolerance;Impaired balance (sitting and/or standing);Impaired vision/perception;Decreased safety awareness;Decreased knowledge of precautions;Decreased knowledge of use of DME or AE;Pain      OT Treatment/Interventions: Self-care/ADL training;DME and/or AE instruction;Patient/family education;Balance training    OT  Goals(Current goals can be found in the care plan section) Acute Rehab OT Goals Patient Stated Goal: to be able to move around better OT Goal Formulation: With patient Time For Goal Achievement: 08/28/20 Potential to Achieve Goals: Fair  OT Frequency: Min 2X/week   Barriers to D/C: Decreased caregiver support             AM-PAC OT "6 Clicks" Daily Activity     Outcome Measure Help from another person eating meals?: A Little Help from another person taking care of personal grooming?: A Lot Help from another person toileting, which includes using toliet, bedpan, or urinal?: Total Help from another person bathing (including washing, rinsing, drying)?: A Lot Help from another person to put on and taking off regular upper body clothing?: Total Help from another person to put on and taking off regular lower body clothing?: Total 6 Click Score: 10   End of Session Equipment Utilized During Treatment: Gait belt Nurse Communication:  (blood oozing from residual limb through stocking cover)  Activity Tolerance: Patient tolerated treatment well Patient left: in bed;with call bell/phone within reach;with bed alarm set  OT Visit Diagnosis: Other abnormalities of gait and mobility (R26.89);Muscle weakness (generalized) (M62.81);Pain;Low vision, both eyes (H54.2) Pain - Right/Left: Left Pain - part of body: Leg                Time: 3710-6269 OT Time Calculation (min): 31 min Charges:  OT General Charges $OT Visit: 1 Visit OT Evaluation $OT Eval Moderate Complexity: 1 Mod OT Treatments $Self Care/Home Management : 8-22 mins  [place], OTR/L Acute [employer] Pager [phone] Office [phone]    Almon Register 08/14/2020, 11:49 AM

## 2020-08-14 NOTE — Progress Notes (Signed)
Orthopedic Tech Progress Note Patient Details:  Elijah White Jun 27, 1940 [id number]  Patient ID: AIDRIC ENDICOTT, male   DOB: 09/22/1940, 80 y.o.   MRN: [id number] Called order into [employer]  Karolee Stamps 08/14/2020, 8:25 AM

## 2020-08-15 LAB — SURGICAL PATHOLOGY

## 2020-08-15 MED ORDER — MUPIROCIN 2 % EX OINT
1.0000 "application " | TOPICAL_OINTMENT | Freq: Two times a day (BID) | CUTANEOUS | Status: DC
  Administered 2020-08-15: 1 via NASAL
  Filled 2020-08-15: qty 22

## 2020-08-15 NOTE — Progress Notes (Signed)
Physical Therapy Treatment Patient Details Name: Elijah White MRN: [id number] DOB: 1940/06/21 Today's Date: 08/15/2020    History of Present Illness pt is an 80 y/o male presenting 6/10 for follow up of left foot pain at rest after recent stenting and angioplasty of the left SFA.  Pt now s/p L transtibial amputation.  PMHx:  Aortic stenosis s/p TAVR, prostate CA, DM2, ESRD, Glaucoma, HTN, legal blindness L eye, PAD    PT Comments    PTA called back into room to assist with transfer from BSC>bed, pt performed with good participation and fair tolerance. Initially we planned to practice transfers to chair however pt with increased LLE pain and drainage noted to L limb with standing in Stedy from and with L knee flexion/extension so returned to bed for dressing to be placed by RN and for pt comfort, ice to distal LLE. This session focus on building RLE strength through standing tolerance within Stedy and LLE AROM exercises, will plan to bring amputee HEP handout next session for pt to share with family/caretakers to reinforce exercises and to practice posterior seated scoot transfers to chair vs lateral slide board transfers to drop arm chair as he had significant difficulty balancing in Stedy seat this date. Pt continues to benefit from PT services to progress toward functional mobility goals. Continue to recommend SNF.  Follow Up Recommendations  SNF;Supervision for mobility/OOB     Equipment Recommendations  Wheelchair (measurements PT);Wheelchair cushion (measurements PT);3in1 (PT);Other (comment) (RW as pt improves)    Recommendations for Other Services       Precautions / Restrictions Precautions Precautions: Fall Required Braces or Orthoses: Other Brace Other Brace: L residual limb shrinker (RN doffed after return to bed due to blood/drainage and shrinker needing to be cleaned, RN to notify ortho tech to see if he can get another) Restrictions Weight Bearing Restrictions: Yes LLE  Weight Bearing: Non weight bearing    Mobility  Bed Mobility Overal bed mobility: Needs Assistance Bed Mobility: Sit to Supine Rolling: Min guard Sidelying to sit: +2 for safety/equipment;Mod assist   Sit to supine: Min assist;+2 for physical assistance   General bed mobility comments: Pt needs consisent VCs for sequencing due to legally blind and use of bed features    Transfers Overall transfer level: Needs assistance Equipment used: Ambulation equipment used Transfers: Sit to/from Stand Sit to Stand: Max assist;+2 physical assistance;+2 safety/equipment;From elevated surface (+3 for final stand from elevated Stedy seat/to get flaps out of the way)         General transfer comment: BSC>Stedy and Stedy>EOB, pt having difficulty remaining on Stedy seat due to blindness/confusion and tending to slide too far anterior, needed +3 for final stand from [facility] seat>EOB for asisst to move flaps  Ambulation/Gait             General Gait Details: pt unable, poor balance and poor standing tolerance in stedy   Stairs             Wheelchair Mobility    Modified Rankin (Stroke Patients Only)       Balance Overall balance assessment: Needs assistance Sitting-balance support: Bilateral upper extremity supported (RLE supported) Sitting balance-Leahy Scale: Poor Sitting balance - Comments: pt tending to use 1-2 UE support and some posterior lean seated in Stedy vs EOB Postural control: Posterior lean   Standing balance-Leahy Scale: Zero Standing balance comment: +3 assist for standing during peri-care after toileting and to stand more upright to get flaps out of the way  of his hips                            Cognition Arousal/Alertness: Awake/alert Behavior During Therapy: Flat affect Overall Cognitive Status: No family/caregiver present to determine baseline cognitive functioning                                 General Comments: pt with some  difficulty following cues for use of Stedy (due to visual deficits) and tending to slide forward on Stedy seat so not very safe with device, recommend slide board vs posterior scoot transfer into chair next session for safety; some difficulty following commands at times but may be more due to blindness/unfamiliarity with hospital environment/equipment than due to cognition?      Exercises Amputee Exercises Quad Sets: AROM;Left;10 reps;Supine Gluteal Sets: AROM;10 reps;Supine Knee Flexion: AROM;Strengthening;Left;10 reps Knee Extension: AROM;Strengthening;Left;10 reps    General Comments General comments (skin integrity, edema, etc.): HR 70's bpm and SpO2 WNL on RA      Pertinent Vitals/Pain Pain Assessment: Faces Faces Pain Scale: Hurts whole lot Pain Location: L LE with knee flexion/extension and transfers Pain Descriptors / Indicators: Sore;Grimacing;Moaning;Operative site guarding;Sharp Pain Intervention(s): Monitored during session;Repositioned;RN gave pain meds during session;Ice applied    Home Living                      Prior Function            PT Goals (current goals can now be found in the care plan section) Acute Rehab PT Goals Patient Stated Goal: to be able to move around better PT Goal Formulation: With patient Time For Goal Achievement: 08/27/20 Potential to Achieve Goals: Fair Progress towards PT goals: Progressing toward goals    Frequency    Min 3X/week      PT Plan Current plan remains appropriate    Co-evaluation              AM-PAC PT "6 Clicks" Mobility   Outcome Measure  Help needed turning from your back to your side while in a flat bed without using bedrails?: A Little Help needed moving from lying on your back to sitting on the side of a flat bed without using bedrails?: A Lot Help needed moving to and from a bed to a chair (including a wheelchair)?: A Lot Help needed standing up from a chair using your arms (e.g.,  wheelchair or bedside chair)?: Total (+2-3 maxA) Help needed to walk in hospital room?: Total Help needed climbing 3-5 steps with a railing? : Total 6 Click Score: 10    End of Session Equipment Utilized During Treatment: Gait belt Activity Tolerance: Patient limited by pain Patient left: with call bell/phone within reach;in bed;with bed alarm set;with nursing/sitter in room (RN present to apply dressing to L incision where increased drainage observed) Nurse Communication: Mobility status;Need for lift equipment;Other (comment);Patient requests pain meds;Precautions (try posterior scoot to/from BSC<>EOB vs lateral scoot to recliner if needing OOB; doesn't do well with Charlaine Dalton) PT Visit Diagnosis: Other abnormalities of gait and mobility (R26.89);Muscle weakness (generalized) (M62.81);Pain Pain - Right/Left: Left Pain - part of body: Leg     Time: 1540-0867 PT Time Calculation (min) (ACUTE ONLY): 14 min  Charges:  $Therapeutic Exercise: 8-22 mins $Therapeutic Activity: 8-22 mins  Houston Siren., PTA Acute Rehabilitation Services Pager: [phone] Office: [place] 08/15/2020, 5:29 PM

## 2020-08-15 NOTE — TOC Progression Note (Signed)
Transition of Care [facility]) - Progression Note    Patient Details  Name: Elijah White MRN: [id number] Date of Birth: [birth year]  Transition [facility] Care [facility]) CM/SW [place], [place] Phone Number: 08/15/2020, 4:54 PM  Clinical Narrative:     [facility] has offered, depending on if dialysis can be work out -  CSW has reached out to Physiological scientist, CSW [place] for assistance- she will follow up this Probation officer once things has been confirmed  CSW called patient's daughter- left voice message to return call-   Thurmond Butts, MSW, LCSW Clinical Social Worker    Expected Discharge Plan: [place] Barriers to Discharge: Continued Medical Work up, SNF Pending bed offer  Expected Discharge Plan and Services Expected Discharge Plan: [place] arrangements for the past 2 months: Apartment                                       Social Determinants of Health (SDOH) Interventions    Readmission Risk Interventions No flowsheet data found.

## 2020-08-15 NOTE — Progress Notes (Addendum)
Renal Navigator received call from [facility] CSW/C. Johnson requesting an HD clinic switch to [facility] to accommodate discharge to [facility]. Navigator contacted Admissions Coordinator directly to ask if this request was due to a contract issue or transportation need and was told that there is currently no contract between [facility] and [facility] (patient's home HD clinic) AND that if they are able to accommodate patient, he will need to be on a MWF schedule (he is currently TTS). Navigator was told that patient may become long term care.  Navigator spoke with Agricultural consultant at [place] and appreciates being given a MWF 12:35pm to accommodate his discharge to SNF and keep him at his home clinic.  Navigator left message with [facility] to request a contract be drawn up between [facility] [employer] and [facility]. Admissions/Nikki informed. She will confirm with her transportation person tomorrow, but thinks this will work fine. Navigator will follow closely and updated TOC CSW.   Alphonzo Cruise, [place] Renal Navigator [phone]

## 2020-08-15 NOTE — TOC Progression Note (Signed)
Transition of Care [facility]) - Progression Note    Patient Details  Name: Elijah White MRN: [id number] Date of Birth: Mar 20, 1940  Transition of Care [facility]) CM/SW [place], [place] Phone Number: 08/15/2020, 3:39 PM  Clinical Narrative:     CSW spoke with patient's daughter,Elijah White- CSW informed of no current bed offers- she confirmed, she believes the patient needs LTC- he is unable to care for himself and she is unable to provide the level of care that is needed.   CSW explained LTC process and it could take some time- CSW will keep family posted on potential placement  CSW will provide bed offers once available CSW will continue to follow and assist with discharge planning  Thurmond Butts, MSW, LCSW Clinical Social Worker    Expected Discharge Plan: Skilled Nursing Facility Barriers to Discharge: Continued Medical Work up, SNF Pending bed offer  Expected Discharge Plan and Services Expected Discharge Plan: [place] arrangements for the past 2 months: Apartment                                       Social Determinants of Health (SDOH) Interventions    Readmission Risk Interventions No flowsheet data found.

## 2020-08-15 NOTE — Progress Notes (Addendum)
Progress Note    08/15/2020 7:28 AM 3 Days Post-Op  Subjective:  Daughter at bedside. She reports episode of significant pain resolved with morphine. Patient offer no complaints this morning.   Vitals:   08/14/20 2308 08/15/20 0325  BP: 108/60 126/62  Pulse: 66 67  Resp: 18 18  Temp: 97.8 F (36.6 C) 98.2 F (36.8 C)  SpO2: 99% 98%    Physical Exam: General appearance: Awake, alert in no apparent distress Cardiac: Heart rate and rhythm are regular Respirations: Nonlabored Extremities: Amputation site incision is well approximated without bleeding or hematoma.  Anterior and posterior flaps are warm and well-perfused   CBC    Component Value Date/Time   WBC 10.9 (H) 08/14/2020 0106   RBC 2.74 (L) 08/14/2020 0106   HGB 8.9 (L) 08/14/2020 0106   HGB 12.7 (L) 02/19/2018 1014   HCT 28.2 (L) 08/14/2020 0106   HCT 38.7 02/19/2018 1014   PLT 217 08/14/2020 0106   PLT 230 02/19/2018 1014   MCV 102.9 (H) 08/14/2020 0106   MCV 95 02/19/2018 1014   MCH 32.5 08/14/2020 0106   MCHC 31.6 08/14/2020 0106   RDW 13.9 08/14/2020 0106   RDW 13.6 02/19/2018 1014   LYMPHSABS 1.4 06/24/2020 1731   MONOABS 0.8 06/24/2020 1731   EOSABS 0.1 06/24/2020 1731   BASOSABS 0.0 06/24/2020 1731    BMET    Component Value Date/Time   NA 136 08/14/2020 0106   NA 134 (A) 09/11/2019 0000   K 3.6 08/14/2020 0106   CL 99 08/14/2020 0106   CO2 28 08/14/2020 0106   GLUCOSE 159 (H) 08/14/2020 0106   BUN 16 08/14/2020 0106   BUN 34 (A) 09/11/2019 0000   CREATININE 3.96 (H) 08/14/2020 0106   CALCIUM 9.1 08/14/2020 0106   GFRNONAA 14 (L) 08/14/2020 0106   GFRAA 7 (L) 12/03/2019 0710    No intake or output data in the 24 hours ending 08/15/20 [place] Scheduled Meds:  aspirin EC  81 mg Oral q AM   atorvastatin  10 mg Oral q AM   brimonidine  1 drop Right Eye TID   carvedilol  3.125 mg Oral BID WC   Chlorhexidine Gluconate Cloth  6 each Topical Q0600   cinacalcet  60  mg Oral Q T,Th,Sat-1800   clopidogrel  75 mg Oral Q breakfast   docusate sodium  100 mg Oral q AM   dorzolamide-timolol  1 drop Right Eye BID   doxercalciferol  8 mcg Intravenous Q T,Th,Sa-HD   ferric citrate  420 mg Oral TID WC   gabapentin  100 mg Oral QHS   isosorbide mononitrate  30 mg Oral q AM   multivitamin  1 tablet Oral QHS   Netarsudil-Latanoprost  1 drop Right Eye QHS   pantoprazole  40 mg Oral q AM   pilocarpine  1 drop Right Eye QID   Continuous Infusions: PRN Meds:.acetaminophen, bisacodyl, guaiFENesin-dextromethorphan, hydrALAZINE, HYDROcodone-acetaminophen, HYDROcodone-acetaminophen, hydroxypropyl methylcellulose / hypromellose, labetalol, metoprolol tartrate, morphine injection, ondansetron, phenol, polyethylene glycol  Assessment and Plan: POD 3 left BKA. Comfortable and incision healing. Awaiting placement.  ABL anemia: Hgb stable.  PAD: on Plavix, asa and statin. No skin breakdown of right foot.  ESRD: HD on TTS   -DVT prophylaxis:  SCDs   Risa Grill, PA-C [facility] [phone] 08/15/2020  7:28 AM   I have seen and evaluated the patient. I agree with the PA note as documented above.  Left BKA looks good.  Pending SNF placement.  Pain seems reasonably controlled.  Marty Heck, MD [facility] [place] Office: [phone]

## 2020-08-15 NOTE — Progress Notes (Signed)
Physical Therapy Treatment Patient Details Name: Elijah White MRN: [id number] DOB: Nov 12, 1940 Today's Date: 08/15/2020    History of Present Illness pt is an 80 y/o male presenting 6/10 for follow up of left foot pain at rest after recent stenting and angioplasty of the left SFA.  Pt now s/p L transtibial amputation.  PMHx:  Aortic stenosis s/p TAVR, prostate CA, DM2, ESRD, Glaucoma, HTN, legal blindness L eye, PAD    PT Comments    Pt received in supine, agreeable to therapy session and with good participation and tolerance for transfer training this date. Pt limited due to LLE pain with standing transfers and scant drainage noted to end of LLE/at incision site pre-session and droplets noted while seated EOB and flexing knee, RN notified. Pt needing +2 maxA for transfers to [facility] from [place] and poor seated balance within [place], possibly exacerbated by visual deficits. Pt continues to benefit from PT services to progress toward functional mobility goals. Continue to recommend SNF.   Follow Up Recommendations  SNF;Supervision for mobility/OOB     Equipment Recommendations  Wheelchair (measurements PT);Wheelchair cushion (measurements PT);3in1 (PT);Other (comment) (RW as pt improves)    Recommendations for Other Services       Precautions / Restrictions Precautions Precautions: Fall Required Braces or Orthoses: Other Brace Other Brace: L residual limb shrinker (RN doffed after return to bed due to blood/drainage and shrinker needing to be cleaned) Restrictions Weight Bearing Restrictions: Yes LLE Weight Bearing: Non weight bearing    Mobility  Bed Mobility Overal bed mobility: Needs Assistance Bed Mobility: Sit to Supine;Rolling;Sidelying to Sit Rolling: Min guard Sidelying to sit: +2 for safety/equipment;Mod assist       General bed mobility comments: Pt needs consisent VCs for sequencing due to legally blind and use of bed features    Transfers Overall transfer level:  Needs assistance Equipment used: Ambulation equipment used Transfers: Sit to/from Stand Sit to Stand: Max assist;+2 physical assistance;+2 safety/equipment;From elevated surface         General transfer comment: elevated bed<>Stedy>BSC, pt having difficulty remaining on Stedy seat due to blindness/confusion and tending to slide too far anterior, pt therefore put some pressure on distal end of L incision and scant blood droplets noted to dressing (also some drainage noted prior to mobility) will defer Stedy next date due to pt difficulty remaining in Stedy seat safely.  Ambulation/Gait             General Gait Details: pt unable, poor balance and poor standing tolerance in stedy   Stairs             Wheelchair Mobility    Modified Rankin (Stroke Patients Only)       Balance Overall balance assessment: Needs assistance Sitting-balance support: Bilateral upper extremity supported (RLE supported) Sitting balance-Leahy Scale: Poor Sitting balance - Comments: pt tending to use 1-2 UE support and some posterior lean seated in Stedy vs EOB Postural control: Posterior lean   Standing balance-Leahy Scale: Zero Standing balance comment: +2 maxA to stand upright within Stedy frame                            Cognition Arousal/Alertness: Awake/alert Behavior During Therapy: Flat affect Overall Cognitive Status: No family/caregiver present to determine baseline cognitive functioning  General Comments: pt with some difficulty following cues for use of Stedy (due to visual deficits) and tending to slide forward on Stedy seat so not very safe with device, recommend slide board vs posterior scoot transfer into chair next session for safety; some difficulty following commands at times but may be more due to blindness/unfamiliarity with hospital environment/equipment than due to cognition?      Exercises      General  Comments General comments (skin integrity, edema, etc.): pt on BSC, RN notified; HR 70's bpm and SpO2 99% on RA      Pertinent Vitals/Pain Pain Assessment: Faces Faces Pain Scale: Hurts whole lot Pain Location: L LE, when too close to Stedy (pressure at residual limb) and after standing upright, RN notified Pain Descriptors / Indicators: Sore;Grimacing;Moaning;Operative site guarding;Sharp Pain Intervention(s): Limited activity within patient's tolerance;Monitored during session;Repositioned;Patient requesting pain meds-RN notified    Home Living                      Prior Function            PT Goals (current goals can now be found in the care plan section) Acute Rehab PT Goals Patient Stated Goal: to be able to move around better PT Goal Formulation: With patient Time For Goal Achievement: 08/27/20 Potential to Achieve Goals: Fair Progress towards PT goals: Progressing toward goals    Frequency    Min 3X/week      PT Plan Current plan remains appropriate    Co-evaluation              AM-PAC PT "6 Clicks" Mobility   Outcome Measure  Help needed turning from your back to your side while in a flat bed without using bedrails?: A Little Help needed moving from lying on your back to sitting on the side of a flat bed without using bedrails?: A Lot Help needed moving to and from a bed to a chair (including a wheelchair)?: A Lot Help needed standing up from a chair using your arms (e.g., wheelchair or bedside chair)?: Total (+2 maxA) Help needed to walk in hospital room?: Total Help needed climbing 3-5 steps with a railing? : Total 6 Click Score: 10    End of Session Equipment Utilized During Treatment: Gait belt Activity Tolerance: Patient tolerated treatment well Patient left: with call bell/phone within reach (pt seated on [facility] and RN waiting outside room to assist him) Nurse Communication: Mobility status;Need for lift equipment;Other (comment);Patient  requests pain meds;Precautions (try posterior scoot to/from BSC<>EOB vs lateral scoot to recliner if needing OOB; doesn't do well with Charlaine Dalton) PT Visit Diagnosis: Other abnormalities of gait and mobility (R26.89);Muscle weakness (generalized) (M62.81);Pain Pain - Right/Left: Left Pain - part of body: Leg     Time: 8841-6606 PT Time Calculation (min) (ACUTE ONLY): 13 min  Charges:  $Therapeutic Activity: 8-22 mins                     Rayhan Groleau P., PTA Acute Rehabilitation Services Pager: [phone] Office: [place] 08/15/2020, 5:19 PM

## 2020-08-15 NOTE — TOC Progression Note (Signed)
Transition of Care [facility]) - Progression Note    Patient Details  Name: Elijah White MRN: [id number] Date of Birth: Sep 19, 1940  Transition of Care [facility]) CM/SW [place], [place] Phone Number: 08/15/2020, 11:04 AM  Clinical Narrative:     Patient has no bed offers at this time.  CSW will continue to follow and assist with d/c planning.  Thurmond Butts, MSW, LCSW Clinical Social Worker    Expected Discharge Plan: Skilled Nursing Facility Barriers to Discharge: Continued Medical Work up, SNF Pending bed offer  Expected Discharge Plan and Services Expected Discharge Plan: [place] arrangements for the past 2 months: Apartment                                       Social Determinants of Health (SDOH) Interventions    Readmission Risk Interventions No flowsheet data found.

## 2020-08-15 NOTE — Progress Notes (Signed)
[facility] Progress Note  Subjective: seen in room, in good spirits  Vitals:   08/14/20 1925 08/14/20 2308 08/15/20 0325 08/15/20 0740  BP: (!) 105/56 108/60 126/62 (!) 110/58  Pulse: 68 66 67 72  Resp: 18 18 18 18   Temp: 97.7 F (36.5 C) 97.8 F (36.6 C) 98.2 F (36.8 C) 97.8 F (36.6 C)  TempSrc: Oral Oral Oral Oral  SpO2: 99% 99% 98% 98%  Weight:      Height:        Exam:  alert, nad , no distress  no jvd  Chest cta bilat  Cor reg no RG  Abd soft ntnd no ascites   Ext no LE edema, new L BKA   Alert, NF, ox3   LUE AVF+bruit    OP HD: [place] TTS  4h 72.5kg  2/2 bath  LUE AVF  Hep none  - venofer 100 each rx  - hectorol 8ug tiw  - sensipar 60mg  po w/ hd  - home renal: auryxia 2 ac tid/ dialyvite qd/ coreg 3.125 bid   Assessment/ Plan: 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 Per vascular surgeon. ESRD: TTS schedule. Next HD 6/14.  BP/ volume - had hypotension/ confusion after HD on Saturday, improved w/ NS bolus. No further issues and BP's are stable 100- 120/ 50- 70 and pt is alert w/ intact mentation. 2 kg under dry wt which is typical for BKA. No fluid off w/ HD tomorrow.  Hypertension: on low-dose carvedilol and isosorbide, BP's low-normal today.  Hx of AS - sp TAVR Nov 2021 Anemia of ESRD: Hemoglobin 11 > 9 to 8.9.  Was not getting esa at OP unit. Follow.  Metabolic Bone Disease: Continue Hectorol, sensipar w hd. Auryxia w/ meals.  Monitor calcium phosphorus level.     Rob Sharia A[place]itt 08/15/2020, 10:25 AM   Recent Labs  Lab 08/13/20 0131 08/14/20 0106  K 4.4 3.6  BUN 23 16  CREATININE 5.98* 3.96*  CALCIUM 9.2 9.1  HGB 9.0* 8.9*    Inpatient medications:  aspirin EC  81 mg Oral q AM   atorvastatin  10 mg Oral q AM   brimonidine  1 drop Right Eye TID   carvedilol  3.125 mg Oral BID WC   Chlorhexidine Gluconate Cloth  6 each Topical Q0600   cinacalcet  60 mg Oral Q T,Th,Sat-1800   clopidogrel  75 mg Oral Q breakfast    docusate sodium  100 mg Oral q AM   dorzolamide-timolol  1 drop Right Eye BID   doxercalciferol  8 mcg Intravenous Q T,Th,Sa-HD   ferric citrate  420 mg Oral TID WC   gabapentin  100 mg Oral QHS   isosorbide mononitrate  30 mg Oral q AM   multivitamin  1 tablet Oral QHS   Netarsudil-Latanoprost  1 drop Right Eye QHS   pantoprazole  40 mg Oral q AM   pilocarpine  1 drop Right Eye QID    acetaminophen, bisacodyl, guaiFENesin-dextromethorphan, hydrALAZINE, HYDROcodone-acetaminophen, HYDROcodone-acetaminophen, hydroxypropyl methylcellulose / hypromellose, labetalol, metoprolol tartrate, morphine injection, ondansetron, phenol, polyethylene glycol

## 2020-08-16 LAB — CBC
HCT: 27.1 % — ABNORMAL LOW (ref 39.0–52.0)
Hemoglobin: 9.2 g/dL — ABNORMAL LOW (ref 13.0–17.0)
MCH: 33.6 pg (ref 26.0–34.0)
MCHC: 33.9 g/dL (ref 30.0–36.0)
MCV: 98.9 fL (ref 80.0–100.0)
Platelets: 277 10*3/uL (ref 150–400)
RBC: 2.74 MIL/uL — ABNORMAL LOW (ref 4.22–5.81)
RDW: 13.6 % (ref 11.5–15.5)
WBC: 7.6 10*3/uL (ref 4.0–10.5)
nRBC: 0 % (ref 0.0–0.2)

## 2020-08-16 LAB — SURGICAL PCR SCREEN
MRSA, PCR: NEGATIVE
Staphylococcus aureus: NEGATIVE

## 2020-08-16 LAB — RENAL FUNCTION PANEL
Albumin: 2.8 g/dL — ABNORMAL LOW (ref 3.5–5.0)
Anion gap: 11 (ref 5–15)
BUN: 36 mg/dL — ABNORMAL HIGH (ref 8–23)
CO2: 27 mmol/L (ref 22–32)
Calcium: 9.4 mg/dL (ref 8.9–10.3)
Chloride: 94 mmol/L — ABNORMAL LOW (ref 98–111)
Creatinine, Ser: 8.08 mg/dL — ABNORMAL HIGH (ref 0.61–1.24)
GFR, Estimated: 6 mL/min — ABNORMAL LOW (ref >60)
Glucose, Bld: 116 mg/dL — ABNORMAL HIGH (ref 70–99)
Phosphorus: 4.6 mg/dL (ref 2.5–4.6)
Potassium: 3.8 mmol/L (ref 3.5–5.1)
Sodium: 132 mmol/L — ABNORMAL LOW (ref 135–145)

## 2020-08-16 LAB — SARS CORONAVIRUS 2 (TAT 6-24 HRS): SARS Coronavirus 2: NEGATIVE

## 2020-08-16 MED ORDER — ALTEPLASE 2 MG IJ SOLR: 2.0000 mg | Freq: Once | INTRAMUSCULAR | Status: DC | PRN

## 2020-08-16 MED ORDER — ATORVASTATIN CALCIUM 10 MG PO TABS: 10.0000 mg | ORAL_TABLET | Freq: Every morning | ORAL | Status: AC

## 2020-08-16 MED ORDER — OXYCODONE-ACETAMINOPHEN 5-325 MG PO TABS: 1.0000 | ORAL_TABLET | Freq: Four times a day (QID) | ORAL | 0 refills | Status: DC | PRN

## 2020-08-16 MED ORDER — ISOSORBIDE MONONITRATE ER 30 MG PO TB24: 30.0000 mg | ORAL_TABLET | Freq: Every morning | ORAL | Status: DC

## 2020-08-16 MED ORDER — PANTOPRAZOLE SODIUM 40 MG PO TBEC: 40.0000 mg | DELAYED_RELEASE_TABLET | Freq: Every day | ORAL | Status: DC

## 2020-08-16 MED ORDER — GABAPENTIN 100 MG PO CAPS: 100.0000 mg | ORAL_CAPSULE | ORAL | Status: DC

## 2020-08-16 MED ORDER — DOXERCALCIFEROL 4 MCG/2ML IV SOLN
INTRAVENOUS | Status: AC
  Administered 2020-08-16: 8 ug via INTRAVENOUS
  Filled 2020-08-16: qty 4

## 2020-08-16 MED ORDER — CARVEDILOL 3.125 MG PO TABS: 3.1250 mg | ORAL_TABLET | Freq: Two times a day (BID) | ORAL | Status: DC

## 2020-08-16 MED ORDER — HYDROCODONE-ACETAMINOPHEN 5-325 MG PO TABS
ORAL_TABLET | ORAL | Status: AC
  Administered 2020-08-16: 1 via ORAL
  Filled 2020-08-16: qty 1

## 2020-08-16 NOTE — TOC Progression Note (Signed)
Transition of Care [facility]) - Progression Note    Patient Details  Name: Elijah White MRN: [id number] Date of Birth: 18-Apr-1940  Transition of Care [facility]) CM/SW [place], [place] Phone Number: 08/16/2020, 3:32 PM  Clinical Narrative:     CSW started insurance authorization for patient.Reference number is #[id number]. Requested start date is for 6/14. Patient has SNF bed at [facility]. Insurance authorization is pending. CSW will continue to follow and assist with dc planning needs.  Expected Discharge Plan: [place] Barriers to Discharge: Continued Medical Work up, SNF Pending bed offer  Expected Discharge Plan and Services Expected Discharge Plan: [place] arrangements for the past 2 months: Apartment Expected Discharge Date: 08/16/20                                     Social Determinants of Health (SDOH) Interventions    Readmission Risk Interventions No flowsheet data found.

## 2020-08-16 NOTE — TOC Progression Note (Signed)
Transition of Care [facility]) - Progression Note    Patient Details  Name: Elijah White MRN: [id number] Date of Birth: May 02, 1940  Transition of Care [facility]) CM/SW [place], [place] Phone Number: 08/16/2020, 12:47 PM  Clinical Narrative:     CSW spoke with patient's daughter,[name]- she agreeable to [facility] for placement-  CSW informed SNF and advised anticipated discharge tomorrow  RN updated & Covid test requested  [facility] confirmed they are aware of patient's change for time and days of dialysis & is agreeable  CSW will continue to follow and assist with discharge planning.  Thurmond Butts, MSW, LCSW Clinical Social Worker     Expected Discharge Plan: Skilled Nursing Facility Barriers to Discharge: Continued Medical Work up, SNF Pending bed offer  Expected Discharge Plan and Services Expected Discharge Plan: [place] arrangements for the past 2 months: Apartment Expected Discharge Date: 08/16/20                                     Social Determinants of Health (SDOH) Interventions    Readmission Risk Interventions No flowsheet data found.

## 2020-08-16 NOTE — Progress Notes (Signed)
[facility] Progress Note  Subjective: seen in room, in good spirits  Vitals:   08/16/20 1030 08/16/20 1100 08/16/20 1130 08/16/20 1200  BP: (!) 147/73 114/60 (!) 169/81 125/86  Pulse: 85 85 92   Resp: 16 (!) 27    Temp:      TempSrc:      SpO2: 99% 100%    Weight:      Height:        Exam:  alert, nad , no distress  no jvd  Chest cta bilat  Cor reg no RG  Abd soft ntnd no ascites   Ext no LE edema, new L BKA   Alert, NF, ox3   LUE AVF+bruit    OP HD: [place] TTS  4h 72.5kg  2/2 bath  LUE AVF  Hep none  - venofer 100 each rx  - hectorol 8ug tiw  - sensipar 60mg  po w/ hd  - home renal: auryxia 2 ac tid/ dialyvite qd/ coreg 3.125 bid   Assessment/ Plan: 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 Per vascular surgeon. ESRD: TTS schedule. HD today in progress.  BP/ volume - had hypotension/ confusion after HD on Saturday, improved w/ NS bolus. No further issues and BP's are stable 100- 120/ 50- 70 and pt is alert w/ intact mentation. 2 kg under dry wt which is typical for BKA. No fluid off w/ HD today.  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  Was not getting esa at OP unit. Follow.  Metabolic Bone Disease: Continue Hectorol, sensipar w hd. Auryxia w/ meals.  Monitor calcium phosphorus level.     Rob Doctor, hospital 08/16/2020, 12:32 PM   Recent Labs  Lab 08/14/20 0106 08/16/20 0654  K 3.6 3.8  BUN 16 36*  CREATININE 3.96* 8.08*  CALCIUM 9.1 9.4  PHOS  --  4.6  HGB 8.9* 9.2*    Inpatient medications:  aspirin EC  81 mg Oral q AM   atorvastatin  10 mg Oral q AM   brimonidine  1 drop Right Eye TID   carvedilol  3.125 mg Oral BID WC   Chlorhexidine Gluconate Cloth  6 each Topical Q0600   cinacalcet  60 mg Oral Q T,Th,Sat-1800   clopidogrel  75 mg Oral Q breakfast   docusate sodium  100 mg Oral q AM   dorzolamide-timolol  1 drop Right  Eye BID   doxercalciferol  8 mcg Intravenous Q T,Th,Sa-HD   ferric citrate  420 mg Oral TID WC   gabapentin  100 mg Oral QHS   isosorbide mononitrate  30 mg Oral q AM   multivitamin  1 tablet Oral QHS   mupirocin ointment  1 application Nasal BID   Netarsudil-Latanoprost  1 drop Right Eye QHS   pantoprazole  40 mg Oral q AM   pilocarpine  1 drop Right Eye QID    acetaminophen, alteplase, bisacodyl, guaiFENesin-dextromethorphan, hydrALAZINE, HYDROcodone-acetaminophen, HYDROcodone-acetaminophen, hydroxypropyl methylcellulose / hypromellose, labetalol, metoprolol tartrate, morphine injection, ondansetron, phenol, polyethylene glycol

## 2020-08-16 NOTE — Discharge Summary (Addendum)
Discharge Summary    Elijah White 1940-08-14 80 y.o. 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 male who presents for follow-up visit due to rest pain of left foot. Recent arteriogram with intervention including stent and angioplasy of left SFA. Has chronic ischemic changes to toes as well as significant heel pain on the left.  He is diabetic with diabetic neuropathy.  He is compliant with asa, statin and Plavix. His daughter and son accompany him today. 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   He continues with significant left heel pain.    The pt is on a statin for cholesterol management.    The pt is on an aspirin.    Other AC:  Plavix The pt is on BB for hypertension. The pt does have diabetes. Tobacco hx:  former   History of ESRD on TTS at [facility]   Hospital Course:  The patient was admitted to the hospital and taken to the operating room on 08/12/2020 and underwent: Left below knee amputation.     Findings: Left below-knee amputation with healthy tissue margins.  The pt tolerated the procedure well and was transported to the PACU in good condition.   POD 1, pt had HD.   He was confused and somnolent after dialysis today. Nonfocal on exam. Appropriate answers to orientation questions. Suspect he had too much fluid taken off at dialysis. He had symptomatic improvement after a IV fluid bolus. Will plan to observe for now.  POD 2, pt better.  PT/OT recommending SNF.    POD 3, pt had pain, but much better with pain medication per daughter.  Plavix/asa/statin continued.    POD 4, incision looks excellent and ready for dc to SNF.    POD 5, pt doing well.  Insurance approval pending.  Pt incision healing nicely.  He is able to straighten his  knee. Having some phantom pains. Will need post operative pain control. Has follow up arranged with [facility] in 1 month for staple removal  CBC    Component Value Date/Time   WBC 7.6 08/16/2020 0654   RBC 2.74 (L) 08/16/2020 0654   HGB 9.2 (L) 08/16/2020 0654   HGB 12.7 (L) 02/19/2018 1014   HCT 27.1 (L) 08/16/2020 0654   HCT 38.7 02/19/2018 1014   PLT 277 08/16/2020 0654   PLT 230 02/19/2018 1014   MCV 98.9 08/16/2020 0654   MCV 95 02/19/2018 1014   MCH 33.6 08/16/2020 0654   MCHC 33.9 08/16/2020 0654   RDW 13.6 08/16/2020 0654   RDW 13.6 02/19/2018 1014   LYMPHSABS 1.4 06/24/2020 1731   MONOABS 0.8 06/24/2020 1731   EOSABS 0.1 06/24/2020 1731   BASOSABS 0.0 06/24/2020 1731    BMET    Component Value Date/Time   NA 132 (L) 08/16/2020 0654   NA 134 (A) 09/11/2019 0000   K 3.8 08/16/2020 0654   CL 94 (L) 08/16/2020 0654   CO2 27 08/16/2020 0654   GLUCOSE 116 (H) 08/16/2020 0654   BUN 36 (H) 08/16/2020 0654   BUN 34 (A) 09/11/2019 0000   CREATININE 8.08 (H) 08/16/2020 0654   CALCIUM 9.4 08/16/2020 0654   GFRNONAA 6 (L) 08/16/2020 0654   GFRAA 7 (L) 12/03/2019 0710      Discharge Instructions  Call MD for:  redness, tenderness, or signs of infection (pain, swelling, bleeding, redness, odor or green/yellow discharge around incision site)   Complete by: As directed    Call MD for:  severe or increased pain, loss or decreased feeling  in affected limb(s)   Complete by: As directed    Call MD for:  temperature >100.5   Complete by: As directed    Discharge patient   Complete by: As directed    Discharge to SNF once bed available.   Discharge disposition: 03-Skilled Nursing Facility   Discharge patient date: 08/16/2020   Discharge patient   Complete by: As directed    Discharge disposition: 03-Skilled [place]   Discharge patient date: 08/19/2020   Discharge wound care:   Complete by: As directed    Wash left below knee amputation incision with soap and  water daily.   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, 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[place]) 08/12/2020   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   Acute on chronic diastolic heart failure ([place]) 01/19/2020   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   RBBB    Near syncope 12/13/2019   Severe aortic stenosis    ESRD (end stage renal disease) ([place]) 11/26/2019   Macrocytic anemia 11/26/2019   Adrenal mass ([place]) 11/26/2019   Colonic mass 11/26/2019   ARF (acute renal failure) ([place]) 03/25/2018   Vision loss of right eye 11/11/2017   Bilateral pseudophakia 07/11/2017   GERD (gastroesophageal reflux disease) 09/10/2016   HLD (hyperlipidemia) 09/10/2016   Essential hypertension 08/13/2016   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   Type 2 diabetes mellitus with renal manifestations ([place]) 10/22/2006   BPH (benign prostatic hyperplasia) 10/22/2006   COLONIC POLYPS, HX OF 10/22/2006   Past Medical History:  Diagnosis Date   Anemia    low iron   Aortic stenosis    s/p TAVR 01/19/20   Arthritis    Cancer ([place])    prostate, s/p I-125 seed implant 05/18/05   Colon polyps ~ 1993 and 2003   Dr [name], [facility].  08/2001 colonoscopy: tubular adenoma at cecum.     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   Elevated cholesterol with high triglycerides    ESRD (end stage renal disease) ([place])    [facility] - [facility]   GERD (gastroesophageal reflux disease)    Glaucoma    Hypertension    Legally blind in left eye, as defined in [place]    has pinpoint vision in right eye   PAD (peripheral artery disease) ([place])    RBBB      Allergies as of 08/19/2020   No Known Allergies      Medication List     TAKE these medications    acetaminophen 500 MG tablet Commonly known  as: TYLENOL Take 1,000 mg by mouth every 6 (six) hours as needed for moderate pain or headache.   aspirin EC 81 MG tablet Take 81 mg by mouth in the morning. Swallow whole.   atorvastatin 10 MG tablet Commonly known as: LIPITOR Take 1 tablet (10 mg total) by mouth in the morning.   Auryxia 1 GM 210 MG(Fe) tablet Generic drug: ferric citrate Take 420 mg by mouth 3 (three) times daily with meals.   blood glucose  meter kit and supplies Kit Dispense based on patient and insurance preference. Use up to four times daily as directed. (FOR ICD-9 250.00, 250.01). For QAC - HS accuchecks.   brimonidine 0.15 % ophthalmic solution Commonly known as: ALPHAGAN Place 1 drop into the right eye 3 (three) times daily.   carvedilol 3.125 MG tablet Commonly known as: COREG Take 1 tablet (3.125 mg total) by mouth 2 (two) times daily with a meal. What changed: See the new instructions.   clopidogrel 75 MG tablet Commonly known as: PLAVIX Take 1 tablet (75 mg total) by mouth daily with breakfast.   Dialyvite 800 0.8 MG Tabs Take 1 tablet by mouth in the morning.   dorzolamide-timolol 22.3-6.8 MG/ML ophthalmic solution Commonly known as: COSOPT Place 1 drop into the right eye 2 (two) times daily.   gabapentin 100 MG capsule Commonly known as: NEURONTIN Take 1-2 capsules (100-200 mg total) by mouth See admin instructions. Take 2 capsules (200 mg) by mouth daily at bedtime on Tues, Thurs, and Sat. Take 1 capsule (100 mg) by mouth daily at bedtime daily on Mon, Wed, Fri, and Sun.   hydrocortisone cream 1 % Apply 1 application topically 3 (three) times daily as needed for itching.   hydroxypropyl methylcellulose / hypromellose 2.5 % ophthalmic solution Commonly known as: ISOPTO TEARS / GONIOVISC Place 1 drop into the left eye as needed for dry eyes.   isosorbide mononitrate 30 MG 24 hr tablet Commonly known as: IMDUR Take 1 tablet (30 mg total) by mouth in the morning. What changed: See the  new instructions.   OneTouch Delica Plus [id number] Misc   OneTouch Verio test strip Generic drug: glucose blood USE AS DIRECTED TO TEST FOUR TIMES A DAY   oxyCODONE-acetaminophen 5-325 MG tablet Commonly known as: Percocet Take 1 tablet by mouth every 6 (six) hours as needed (pain).   pantoprazole 40 MG tablet Commonly known as: PROTONIX Take 1 tablet (40 mg total) by mouth daily at 12 noon. What changed: See the new instructions.   pilocarpine 4 % ophthalmic solution Commonly known as: PILOCAR Place 1 drop into the right eye 4 (four) times daily.   Rocklatan 0.02-0.005 % Soln Generic drug: Netarsudil-Latanoprost Place 1 drop into the right eye at bedtime.               Discharge Care Instructions  (From admission, onward)           Start     Ordered   08/16/20 0000  Discharge wound care:       Comments: Wash left below knee amputation incision with soap and water daily.   08/16/20 0953            Prescriptions given: Roxicet #28 No Refill  Instructions: 1.  Clean incision daily with soap and water.  2.  Continue working on straightening left knee to prevent contracture.   Disposition: SNF  Patient's condition: is Good  Follow up: 1.  [facility] in 4-5 weeks   Leontine Locket, [place] [facility] [phone] 08/19/2020  3:15 PM

## 2020-08-16 NOTE — Consult Note (Signed)
[facility] Inpatient Consult   08/16/2020  Elijah White [birth year] [id number]  Patient is currently active with [facility] [facility] for chronic disease management services.  Patient has been engaged by a [place] case Freight forwarder and Education officer, museum at primary care office, [facility].  Per review, current disposition plan is for SNF. Will update community care team of patient progression and disposition.  Of note, [facility] Care Management services does not replace or interfere with any services that are arranged by inpatient case management or social work.   Netta Cedars, MSN, [place] [facility] Liaison Nurse Mobile Phone [phone]  Toll free office [phone]

## 2020-08-16 NOTE — Progress Notes (Addendum)
  Progress Note    08/16/2020 7:13 AM 4 Days Post-Op  Subjective:  c/o pain in the toes of left leg that was amputated.  Afebrile HR 60's-80's  010'O-712'R systolic 97% RA  Vitals:   08/15/20 2345 08/16/20 0428  BP: 130/61 111/64  Pulse: 65 72  Resp: 18 16  Temp: 97.7 F (36.5 C) 98 F (36.7 C)  SpO2: 100% 99%    Physical Exam: Incisions:       CBC    Component Value Date/Time   WBC 10.9 (H) 08/14/2020 0106   RBC 2.74 (L) 08/14/2020 0106   HGB 8.9 (L) 08/14/2020 0106   HGB 12.7 (L) 02/19/2018 1014   HCT 28.2 (L) 08/14/2020 0106   HCT 38.7 02/19/2018 1014   PLT 217 08/14/2020 0106   PLT 230 02/19/2018 1014   MCV 102.9 (H) 08/14/2020 0106   MCV 95 02/19/2018 1014   MCH 32.5 08/14/2020 0106   MCHC 31.6 08/14/2020 0106   RDW 13.9 08/14/2020 0106   RDW 13.6 02/19/2018 1014   LYMPHSABS 1.4 06/24/2020 1731   MONOABS 0.8 06/24/2020 1731   EOSABS 0.1 06/24/2020 1731   BASOSABS 0.0 06/24/2020 1731    BMET    Component Value Date/Time   NA 136 08/14/2020 0106   NA 134 (A) 09/11/2019 0000   K 3.6 08/14/2020 0106   CL 99 08/14/2020 0106   CO2 28 08/14/2020 0106   GLUCOSE 159 (H) 08/14/2020 0106   BUN 16 08/14/2020 0106   BUN 34 (A) 09/11/2019 0000   CREATININE 3.96 (H) 08/14/2020 0106   CALCIUM 9.1 08/14/2020 0106   GFRNONAA 14 (L) 08/14/2020 0106   GFRAA 7 (L) 12/03/2019 0710    INR    Component Value Date/Time   INR 1.0 01/14/2020 0844     Intake/Output Summary (Last 24 hours) at 08/16/2020 0713 Last data filed at 08/15/2020 2117 Gross per 24 hour  Intake 610 ml  Output --  Net 610 ml     Assessment/Plan:  80 y.o. male is s/p left below knee amputation  4 Days Post-Op  -pt's incision looks good -pt ready for discharge - bed available at [facility] pending HD schedule.  Renal navigator assisting with this.   -f/u in 4-5 weeks for staple removal  Leontine Locket, PA-C [facility] [phone] 08/16/2020 7:13 AM  I  have seen and evaluated the patient. I agree with the PA note as documented above.  Status post left BKA.  BKA looks excellent as pictured above.  He is ready for discharge from my standpoint.  We will arrange follow-up in the office in about 4 weeks for staple removal.  Nephrology is following for ESRD and appreciate their assistance.  Pending SNF placement.  Pain seems reasonably controlled.  Marty Heck, MD [facility] [place] Office: [phone]

## 2020-08-17 ENCOUNTER — Ambulatory Visit: Payer: Medicare Other

## 2020-08-17 DIAGNOSIS — N186 End stage renal disease: Secondary | ICD-10-CM

## 2020-08-17 DIAGNOSIS — Z992 Dependence on renal dialysis: Secondary | ICD-10-CM

## 2020-08-17 MED ORDER — MORPHINE SULFATE (PF) 2 MG/ML IV SOLN
INTRAVENOUS | Status: AC
  Filled 2020-08-17: qty 1

## 2020-08-17 NOTE — Chronic Care Management (AMB) (Signed)
  Chronic Care Management   Inpatient Admit Review Note  08/17/2020 Name: Elijah White MRN: [id number] DOB: May 10, 1940  Elijah White is a 80 y.o. year old male who is a primary care patient of [name], MD. Elijah White is actively engaged with the embedded care management team in the primary care practice and is being followed by RN Case Manager Pharmacist BSW for assistance with disease management and care coordination needs related to DM and ESRD.   Elijah White is currently admitted to the hospital for evaluation and treatment of DM, ESRD, and Left BKA . SW performed chart review to note patient is stable for discharge with rehab bed awaiting at [facility]. Insurance authorization is pending.  Plan: SW will follow up with [facility] and the patients daughter on 6.21.22 to assess progression in rehab as well as long term care plans.   [name], BSW, CDP Social Worker, Certified Dementia Practitioner [place] [facility] [place] [facility] [phone]  Total time spent performing care coordination and/or care management activities with the patient by phone or face to face = 15 minutes.

## 2020-08-17 NOTE — Progress Notes (Signed)
Renal Navigator received call from [facility] and was informed that contact between [employer] and Rehab and [place] [facility] should be in place tomorrow. Navigator will get confirmation of this when it has been completed and then patient can discharge to SNF. Navigator updated TOC CSW/C. Johnson.   Alphonzo Cruise, [place] Renal Navigator [phone]

## 2020-08-17 NOTE — Progress Notes (Signed)
Mobility Specialist: Progress Note   08/17/20 1101  Mobility  Activity Transferred:  Bed to chair  Level of Assistance +2 (takes two people)  Assistive Device Sliding board  Mobility Out of bed to chair with meals  Mobility Response Tolerated fair  Mobility performed by Mobility specialist;Other (comment) (PTA [place])  Bed Position Chair  $Mobility charge 1 Mobility   Pt was assisted in transferring from the bed to the recliner utilizing sliding board. Pt required modA to transfer with VC for positioning and technique. Pt required physical assistance for trunk support to maintain balance throughout as well as pulling the pad to assist with scooting.  [facility] Elijah White Mobility Specialist Mobility Specialist Phone: [phone]

## 2020-08-17 NOTE — Progress Notes (Signed)
Renal Navigator has not heard back about contract between [employer] and [facility]. Navigator left message and sent email asking for an update. Navigator left message for [facility] to check in with her also. Navigator also asked Dr. Jonnie Finner to write orders for HD today to get patient on new MWF schedule.   Alphonzo Cruise, [place] Renal Navigator [phone]

## 2020-08-17 NOTE — TOC Progression Note (Signed)
Transition of Care [facility]) - Progression Note    Patient Details  Name: Elijah White MRN: [id number] Date of Birth: [birth year]  [facility]) CM/SW [place], [place] Phone Number: 08/17/2020, 12:58 PM  Clinical Narrative:     Received insurance authorization - # [id number]  from 6/14-6/16 referene # [id number] Updated Elaine  Once contract with [place] [facility]F has been confirmed patient will be ready for d/c.  CSW will continue to follow and assist with discharge planning.   Thurmond Butts, MSW, LCSW Clinical Social Worker    Expected Discharge Plan: Skilled Nursing Facility Barriers to Discharge: Continued Medical Work up, SNF Pending bed offer  Expected Discharge Plan and Services Expected Discharge Plan: [place] arrangements for the past 2 months: Apartment Expected Discharge Date: 08/16/20                                     Social Determinants of Health (SDOH) Interventions    Readmission Risk Interventions No flowsheet data found.

## 2020-08-17 NOTE — Progress Notes (Signed)
[facility] Progress Note  Subjective: seen in HD refusing to have full HD and doesn't want any fluid off  Vitals:   08/17/20 1500 08/17/20 1530 08/17/20 1600 08/17/20 1630  BP: (!) 112/54 (!) 109/58 (!) 111/58 (!) 141/60  Pulse: 71 76 81   Resp: 18 11 15    Temp:      TempSrc:      SpO2: 100% 98% 95%   Weight:      Height:        Exam:  alert, nad , no distress  no jvd  Chest cta bilat  Cor reg no RG  Abd soft ntnd no ascites   Ext no LE edema, new L BKA   Alert, NF, ox3   LUE AVF+bruit    OP HD: [place] [facility]  4h 72.5kg  2/2 bath  LUE AVF  Hep none  - venofer 100 each rx  - hectorol 8ug tiw  - sensipar 60mg  po w/ hd  - home renal: auryxia 2 ac tid/ dialyvite qd/ coreg 3.125 bid   Assessment/ Plan: 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 Per vascular surgeon. ESRD: TTS schedule. HD today on new MWF sched for SNF placement purposes.  BP/ volume - had hypotension/ confusion after HD on Saturday, improved w/ NS bolus. 2 kg under dry wt which is typical for BKA. No fluid off w/ HD today.  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  Was not getting esa at OP unit. Follow.  Metabolic Bone Disease: Continue Hectorol, sensipar w hd. Auryxia w/ meals.  Monitor calcium phosphorus level.     Rob Doctor, hospital 08/17/2020, 4:50 PM   Recent Labs  Lab 08/14/20 0106 08/16/20 0654  K 3.6 3.8  BUN 16 36*  CREATININE 3.96* 8.08*  CALCIUM 9.1 9.4  PHOS  --  4.6  HGB 8.9* 9.2*    Inpatient medications:  aspirin EC  81 mg Oral q AM   atorvastatin  10 mg Oral q AM   brimonidine  1 drop Right Eye TID   carvedilol  3.125 mg Oral BID WC   Chlorhexidine Gluconate Cloth  6 each Topical Q0600   cinacalcet  60 mg Oral Q T,Th,Sat-1800   clopidogrel  75 mg Oral Q breakfast   docusate sodium  100 mg Oral q AM   dorzolamide-timolol  1 drop Right Eye BID    doxercalciferol  8 mcg Intravenous Q T,Th,Sa-HD   ferric citrate  420 mg Oral TID WC   gabapentin  100 mg Oral QHS   isosorbide mononitrate  30 mg Oral q AM   multivitamin  1 tablet Oral QHS   pantoprazole  40 mg Oral q AM   pilocarpine  1 drop Right Eye QID    acetaminophen, bisacodyl, guaiFENesin-dextromethorphan, hydrALAZINE, HYDROcodone-acetaminophen, HYDROcodone-acetaminophen, hydroxypropyl methylcellulose / hypromellose, labetalol, metoprolol tartrate, morphine injection, ondansetron, phenol, polyethylene glycol

## 2020-08-17 NOTE — Progress Notes (Signed)
Physical Therapy Treatment Patient Details Name: Elijah White MRN: [id number] DOB: [birth year] Today's Date: 08/17/2020    History of Present Illness pt is an 80 y/o male presenting 6/10 for follow up of left foot pain at rest after recent stenting and angioplasty of the left SFA.  Pt now s/p L transtibial amputation.  PMHx:  Aortic stenosis s/p TAVR, prostate CA, DM2, ESRD, Glaucoma, HTN, legal blindness L eye, PAD    PT Comments    Pt received in supine, agreeable to therapy session with encouragement and with good participation and fair tolerance for seated scoot transfer training and good tolerance for LE therapeutic exercises. Pt needs multimodal cues for technique/reps 2/2 apparent cognitive deficit and visual deficit during exercises and c/o pain but no increased drainage noted with L knee flexion/extension this date. Assisted pt to don retention sock for edema control. Pt encouraged to sit up in chair at least 1 hour although he is somewhat resistant to this plan, RN notified he will need help with meal which was arriving to room at end of session. Pt continues to benefit from PT services to progress toward functional mobility goals. Continue to recommend SNF.  Follow Up Recommendations  SNF;Supervision for mobility/OOB     Equipment Recommendations  Wheelchair (measurements PT);Wheelchair cushion (measurements PT);3in1 (PT);Other (comment) (RW as pt improves, slide board for now)    Recommendations for Other Services       Precautions / Restrictions Precautions Precautions: Fall Required Braces or Orthoses: Other Brace Other Brace: L residual limb shrinker Restrictions Weight Bearing Restrictions: Yes LLE Weight Bearing: Non weight bearing    Mobility  Bed Mobility Overal bed mobility: Needs Assistance Bed Mobility: Supine to Sit     Supine to sit: Mod assist     General bed mobility comments: Pt needs consisent VCs for sequencing due to legally blind and use of bed  features    Transfers Overall transfer level: Needs assistance Equipment used: Rolling walker (2 wheeled);Sliding board Transfers: Sit to/from Stand;Lateral/Scoot Transfers Sit to Stand: Max assist;+2 physical assistance;+2 safety/equipment        Lateral/Scoot Transfers: Mod assist;+2 physical assistance;With slide board General transfer comment: EOB<>RW but unable to reach full upright posture due to weakness/pain and R knee buckles when fatigued and pt sits very forward on edge of bed needing many cues for posture for posterior scooting onto bed. Pt needs multimodal cues, increased time and +2 modA for lateral seated scoot with slide board.  Ambulation/Gait             General Gait Details: pt unable to stand upright and RLE buckling   Stairs             Wheelchair Mobility    Modified Rankin (Stroke Patients Only)       Balance Overall balance assessment: Needs assistance Sitting-balance support: Bilateral upper extremity supported (RLE supported) Sitting balance-Leahy Scale: Fair Sitting balance - Comments: able to sit with 1-2 UE support, more posterior lean with dynamic seated/scooting tasks Postural control: Posterior lean   Standing balance-Leahy Scale: Zero Standing balance comment: unable to stand fully upright in RW with +2 assist, RLE buckling                            Cognition Arousal/Alertness: Awake/alert Behavior During Therapy: Flat affect Overall Cognitive Status: No family/caregiver present to determine baseline cognitive functioning  General Comments: pt with some difficulty following cues for use of slide board, also inconsistent report of pain (grimacing/expressing pain earlier in session but denies pain while also requesting not to remain up in chair at end of session, residual limb notably red/swollen at distal end and tender to touch). Suspect baseline cognitive impairment but  none listed in past medical history per chart review?      Exercises Amputee Exercises Quad Sets: AROM;Left;10 reps;Supine Towel Squeeze: AROM;10 reps;Seated Hip Flexion/Marching: AROM;Both;10 reps;Seated Knee Flexion: AROM;Strengthening;Left;10 reps Knee Extension: AROM;Strengthening;Left;10 reps    General Comments General comments (skin integrity, edema, etc.): HR 74-82 bpm and SpO2 99% on RA      Pertinent Vitals/Pain Pain Assessment: Faces Faces Pain Scale: Hurts even more Pain Location: L LE with knee flexion/extension and transfers Pain Descriptors / Indicators: Grimacing;Moaning;Operative site guarding Pain Intervention(s): Limited activity within patient's tolerance;Monitored during session;Repositioned;Ice applied (pt not requesting pain meds but residual limb seems very tender to touch (when retention sock donned) and with mobility)     PT Goals (current goals can now be found in the care plan section) Acute Rehab PT Goals Patient Stated Goal: to be able to move around better PT Goal Formulation: With patient Time For Goal Achievement: 08/27/20 Progress towards PT goals: Progressing toward goals    Frequency    Min 3X/week      PT Plan Current plan remains appropriate       AM-PAC PT "6 Clicks" Mobility   Outcome Measure  Help needed turning from your back to your side while in a flat bed without using bedrails?: A Little Help needed moving from lying on your back to sitting on the side of a flat bed without using bedrails?: A Lot Help needed moving to and from a bed to a chair (including a wheelchair)?: A Lot Help needed standing up from a chair using your arms (e.g., wheelchair or bedside chair)?: Total (+2-3 maxA) Help needed to walk in hospital room?: Total Help needed climbing 3-5 steps with a railing? : Total 6 Click Score: 10    End of Session Equipment Utilized During Treatment: Gait belt Activity Tolerance: Patient tolerated treatment  well Patient left: in chair;with chair alarm set;with call bell/phone within reach (RN notified of pt confusion (unsure of cognitive baseline) and needing assist to eat food just arrived) Nurse Communication: Mobility status;Need for lift equipment;Other (comment);Patient requests pain meds;Precautions (needs lateral scoot to recliner if needing back to bed; doesn't do well with Stedy; needs help to eat) PT Visit Diagnosis: Other abnormalities of gait and mobility (R26.89);Muscle weakness (generalized) (M62.81);Pain Pain - Right/Left: Left Pain - part of body: Leg     Time: 2035-5974 PT Time Calculation (min) (ACUTE ONLY): 30 min  Charges:  $Therapeutic Exercise: 8-22 mins $Therapeutic Activity: 8-22 mins                     Dinari Stgermaine P., PTA Acute Rehabilitation Services Pager: [phone] Office: [place] 08/17/2020, 11:34 AM

## 2020-08-17 NOTE — Progress Notes (Addendum)
  Progress Note    08/17/2020 7:35 AM 5 Days Post-Op  Subjective:  resting comfortably.  No complaints.    Afebrile  Vitals:   08/16/20 2314 08/17/20 0432  BP: 121/62 125/72  Pulse: 70 71  Resp: 20 16  Temp: 98.2 F (36.8 C) 98.2 F (36.8 C)  SpO2: 98% 100%    Physical Exam: Incisions:  clean and dry with staples in tact.  Extremities:  able to straighten left knee.  CBC    Component Value Date/Time   WBC 7.6 08/16/2020 0654   RBC 2.74 (L) 08/16/2020 0654   HGB 9.2 (L) 08/16/2020 0654   HGB 12.7 (L) 02/19/2018 1014   HCT 27.1 (L) 08/16/2020 0654   HCT 38.7 02/19/2018 1014   PLT 277 08/16/2020 0654   PLT 230 02/19/2018 1014   MCV 98.9 08/16/2020 0654   MCV 95 02/19/2018 1014   MCH 33.6 08/16/2020 0654   MCHC 33.9 08/16/2020 0654   RDW 13.6 08/16/2020 0654   RDW 13.6 02/19/2018 1014   LYMPHSABS 1.4 06/24/2020 1731   MONOABS 0.8 06/24/2020 1731   EOSABS 0.1 06/24/2020 1731   BASOSABS 0.0 06/24/2020 1731    BMET    Component Value Date/Time   NA 132 (L) 08/16/2020 0654   NA 134 (A) 09/11/2019 0000   K 3.8 08/16/2020 0654   CL 94 (L) 08/16/2020 0654   CO2 27 08/16/2020 0654   GLUCOSE 116 (H) 08/16/2020 0654   BUN 36 (H) 08/16/2020 0654   BUN 34 (A) 09/11/2019 0000   CREATININE 8.08 (H) 08/16/2020 0654   CALCIUM 9.4 08/16/2020 0654   GFRNONAA 6 (L) 08/16/2020 0654   GFRAA 7 (L) 12/03/2019 0710    INR    Component Value Date/Time   INR 1.0 01/14/2020 0844     Intake/Output Summary (Last 24 hours) at 08/17/2020 0735 Last data filed at 08/17/2020 0200 Gross per 24 hour  Intake 420 ml  Output 0 ml  Net 420 ml     Assessment/Plan:  80 y.o. male is s/p left above knee amputation  5 Days Post-Op  -incision healing nicely.  Pt has bed at [facility] and insurance authorization is pending.   -paper rx for pain medication on shadow chart.  -pt ready for discharge.    Leontine Locket, PA-C [facility] [phone] 08/17/2020 7:35 AM  I have seen and evaluated the patient. I agree with the PA note as documented above.  Status post left below-knee amputation.  The wound is healing and looks excellent.  Staples will stay for about 3 to 4 weeks.  He has follow-up in July for staple removal.  Stable for discharge to SNF.  Marty Heck, MD [facility] [place] Office: [phone]

## 2020-08-18 MED ORDER — GABAPENTIN 300 MG PO CAPS
300.0000 mg | ORAL_CAPSULE | Freq: Two times a day (BID) | ORAL | Status: DC
  Administered 2020-08-18 – 2020-08-19 (×2): 300 mg via ORAL
  Filled 2020-08-18 (×2): qty 1

## 2020-08-18 MED ORDER — DOXERCALCIFEROL 4 MCG/2ML IV SOLN
8.0000 ug | INTRAVENOUS | Status: DC
  Filled 2020-08-18: qty 4

## 2020-08-18 MED ORDER — GABAPENTIN 100 MG PO CAPS
100.0000 mg | ORAL_CAPSULE | Freq: Two times a day (BID) | ORAL | Status: DC
  Administered 2020-08-18: 100 mg via ORAL
  Filled 2020-08-18: qty 1

## 2020-08-18 MED ORDER — POLYETHYLENE GLYCOL 3350 17 G PO PACK
17.0000 g | PACK | Freq: Every day | ORAL | Status: DC
  Administered 2020-08-18 – 2020-08-19 (×2): 17 g via ORAL
  Filled 2020-08-18 (×2): qty 1

## 2020-08-18 NOTE — Progress Notes (Signed)
Renal Navigator has still not received confirmation from [facility] that contract between SNF and HD clinic is in place. Navigator requested update and let TOC CSW know.   Alphonzo Cruise, [place] Renal Navigator [phone]

## 2020-08-18 NOTE — Progress Notes (Signed)
Physical Therapy Treatment Patient Details Name: Elijah White MRN: [id number] DOB: 1940-08-10 Today's Date: 08/18/2020    History of Present Illness pt is an 80 y/o male presenting 6/10 for follow up of left foot pain at rest after recent stenting and angioplasty of the left SFA.  Pt now s/p L transtibial amputation.  PMHx:  Aortic stenosis s/p TAVR, prostate CA, DM2, ESRD, Glaucoma, HTN, legal blindness L eye, PAD.    PT Comments    Pt received in supine, noted to have pillow under L residual limb and pt given education on post-op amputee precautions, especially not to place pillow under limb to prevent hip/knee contracture and encouraged donning of shrinker sock and use of cold therapy to reduce edema/pain, RN asked to check on him post-session to ensure ice removed after 20 mins. Pt with continued slow processing this date and unable to achieve full upright posture in Stedy from elevated bed height, pt needing +2 for safety for partial stand and seated scooting due to posterior lean/poor trunk control with dynamic seated tasks. Pt continues to benefit from PT services to progress toward functional mobility goals.   Follow Up Recommendations  SNF;Supervision for mobility/OOB     Equipment Recommendations  Wheelchair (measurements PT);Wheelchair cushion (measurements PT);3in1 (PT);Other (comment) (RW as pt improves, slide board for now)    Recommendations for Other Services       Precautions / Restrictions Precautions Precautions: Fall Required Braces or Orthoses: Other Brace Other Brace: L residual limb shrinker, donned at end of session (not donned upon staff entrance to room) Restrictions Weight Bearing Restrictions: Yes LLE Weight Bearing: Non weight bearing    Mobility  Bed Mobility Overal bed mobility: Needs Assistance Bed Mobility: Rolling;Sidelying to Sit;Sit to Supine Rolling: Min guard Sidelying to sit: Mod assist   Sit to supine: +2 for physical assistance;Max  assist   General bed mobility comments: Pt needs consisent VCs for sequencing due to legally blind and use of bed features, increased assist to return to supine as pt more fatigued with poor command following and unsafely close to edge of bed    Transfers Overall transfer level: Needs assistance Equipment used: Ambulation equipment used Transfers: Sit to/from Stand;Lateral/Scoot Transfers Sit to Stand: Max assist;+2 physical assistance;+2 safety/equipment;From elevated surface        Lateral/Scoot Transfers: +2 physical assistance;Max assist;Mod assist General transfer comment: EOB<>Stedy but unable to reach full upright posture due to weakness/pain and R knee buckles when fatigued and pt sits very forward on edge of bed needing many cues for posture for posterior scooting onto bed. Pt needs multimodal cues, increased time and +2 mod to maxA for lateral seated scoot along EOB toward HOB, pt refusing transfer to chair due to LLE pain this date, RN notified  Ambulation/Gait             General Gait Details: pt unable to stand upright and RLE buckling   Stairs             Wheelchair Mobility    Modified Rankin (Stroke Patients Only)       Balance Overall balance assessment: Needs assistance Sitting-balance support: Bilateral upper extremity supported (RLE supported) Sitting balance-Leahy Scale: Fair Sitting balance - Comments: variable supervision to modA, increased support for dynamic sitting and weight shifting Postural control: Posterior lean   Standing balance-Leahy Scale: Zero Standing balance comment: unable to stand fully upright in RW with +2 assist, RLE buckling  Cognition Arousal/Alertness: Awake/alert Behavior During Therapy: Flat affect Overall Cognitive Status: No family/caregiver present to determine baseline cognitive functioning                                 General Comments: Pt with some  difficulty following cues for lateral vs anterior scooting and standing transfers. Increased processing time needed and pt given tactile introduction to [facility] prior to using to ensure safety. Suspect baseline cognitive impairment but none listed in past medical history per chart review?      Exercises Amputee Exercises Knee Flexion: AROM;Strengthening;Left;10 reps Knee Extension: AROM;Strengthening;Left;10 reps Straight Leg Raises: AROM;Left;10 reps;Supine    General Comments General comments (skin integrity, edema, etc.): BP 144/75 (94) after return to supine/exertion, pt denies dizziness but signficantly fatigued; HR 67-71 bpm and SpO2 97-99% on RA      Pertinent Vitals/Pain Pain Assessment: Faces Faces Pain Scale: Hurts whole lot Pain Location: L LE with knee flexion/extension and transfers, pt reports mild tenderness to palpation prior to OOB mobility only Pain Descriptors / Indicators: Grimacing;Moaning;Operative site guarding;Tender;Sore Pain Intervention(s): Limited activity within patient's tolerance;Monitored during session;Repositioned;Patient requesting pain meds-RN notified;Ice applied (ice to distal posterior end post-session, RN notified to check on it/take off after 20-30 mins)    Home Living                      Prior Function            PT Goals (current goals can now be found in the care plan section) Acute Rehab PT Goals Patient Stated Goal: to be able to move around better PT Goal Formulation: With patient Time For Goal Achievement: 08/27/20 Progress towards PT goals: Progressing toward goals    Frequency    Min 3X/week      PT Plan Current plan remains appropriate    Co-evaluation              AM-PAC PT "6 Clicks" Mobility   Outcome Measure  Help needed turning from your back to your side while in a flat bed without using bedrails?: A Little Help needed moving from lying on your back to sitting on the side of a flat bed without  using bedrails?: A Lot Help needed moving to and from a bed to a chair (including a wheelchair)?: A Lot Help needed standing up from a chair using your arms (e.g., wheelchair or bedside chair)?: Total (+2-3 maxA) Help needed to walk in hospital room?: Total Help needed climbing 3-5 steps with a railing? : Total 6 Click Score: 10    End of Session Equipment Utilized During Treatment: Gait belt;Other (comment) (shrinker sock donned to LLE once in supine) Activity Tolerance: Patient limited by pain;Patient limited by fatigue Patient left: with call bell/phone within reach;in bed;with bed alarm set Nurse Communication: Mobility status;Need for lift equipment;Other (comment);Patient requests pain meds;Precautions (RN notified of pt pain score and ice at distal/posterior L residual limb will need to be removed in ~20 mins, pt cannot use Stedy needs lateral scoot vs hoyer for transfer to chair; no pillow under LLE) PT Visit Diagnosis: Other abnormalities of gait and mobility (R26.89);Muscle weakness (generalized) (M62.81);Pain Pain - Right/Left: Left Pain - part of body: Leg     Time: 9509-3267 PT Time Calculation (min) (ACUTE ONLY): 26 min  Charges:  $Therapeutic Activity: 23-37 mins  Houston Siren., PTA Acute Rehabilitation Services Pager: [phone] Office: [place] 08/18/2020, 5:05 PM

## 2020-08-18 NOTE — Progress Notes (Signed)
Mobility Specialist: Progress Note   08/18/20 1624  Mobility  Activity Stood at bedside  Level of Assistance +2 (takes two people)  Assistive Device Stedy  Mobility Sit up in bed/chair position for meals  Mobility Response Tolerated poorly  Mobility performed by Mobility specialist;Other (comment) (PTA Carly)  $Mobility charge 1 Mobility   Pre-Mobility: 67 HR, 97% SpO2 Post-Mobility: 69 HR, 144/75 BP, 99% SpO2  Pt was assisted to EOB to attempt to stand at the [facility] but was unsuccessful. Pt required +2 assistance but was unable to stand fully. Pt declined second attempt due to pain in LLE, no rating given. Pt was assisted in scooting back to the head of the bead and then assisted back in the bed. Pt has call bell at his side and bed alarm is on.   [facility] Kaisha Wachob Mobility Specialist Mobility Specialist Phone: [phone]

## 2020-08-18 NOTE — Progress Notes (Signed)
[facility] Progress Note  Subjective: no c/o today.   Vitals:   08/17/20 2302 08/18/20 0339 08/18/20 0800 08/18/20 1144  BP: (!) 145/85 (!) 162/68 (!) 165/82 123/66  Pulse: 76 82 88 70  Resp: 20 20 20 20   Temp: 97.6 F (36.4 C) 97.8 F (36.6 C) 98.2 F (36.8 C) 98.2 F (36.8 C)  TempSrc: Oral Oral Oral Oral  SpO2: 100% 98% 98% 98%  Weight:  71 kg    Height:        Exam:  alert, nad , no distress  no jvd  Chest cta bilat  Cor reg no RG  Abd soft ntnd no ascites   Ext no LE edema, new L BKA   Alert, NF, ox3   LUE AVF+bruit    OP HD: [place] [facility]  4h 72.5kg  2/2 bath  LUE AVF  Hep none  - venofer 100 each rx  - hectorol 8ug tiw  - sensipar 60mg  po w/ hd  - home renal: auryxia 2 ac tid/ dialyvite qd/ coreg 3.125 bid   Assessment/ Plan: 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 Per vascular surgeon. ESRD: usual HD TTS, switching to MWF for SNF purposes. HD tomorrow if still here.  BP/ volume - had hypotension/ confusion after HD on 6/11 here, improved w/ NS bolus. 2 kg under dry wt which is typical for BKA. No fluid off w/ HD today. 70- 71 kg new dry wt.  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  Was not getting esa at OP unit. Follow.  Metabolic Bone Disease: Continue Hectorol, sensipar w hd. Auryxia w/ meals.  Monitor calcium phosphorus level.     Rob Doctor, hospital 08/18/2020, 1:42 PM   Recent Labs  Lab 08/14/20 0106 08/16/20 0654  K 3.6 3.8  BUN 16 36*  CREATININE 3.96* 8.08*  CALCIUM 9.1 9.4  PHOS  --  4.6  HGB 8.9* 9.2*    Inpatient medications:  aspirin EC  81 mg Oral q AM   atorvastatin  10 mg Oral q AM   brimonidine  1 drop Right Eye TID   carvedilol  3.125 mg Oral BID WC   Chlorhexidine Gluconate Cloth  6 each Topical Q0600   cinacalcet  60 mg Oral Q T,Th,Sat-1800   clopidogrel  75 mg Oral Q breakfast   docusate sodium   100 mg Oral q AM   dorzolamide-timolol  1 drop Right Eye BID   doxercalciferol  8 mcg Intravenous Q T,Th,Sa-HD   ferric citrate  420 mg Oral TID WC   gabapentin  300 mg Oral BID   isosorbide mononitrate  30 mg Oral q AM   multivitamin  1 tablet Oral QHS   pantoprazole  40 mg Oral q AM   pilocarpine  1 drop Right Eye QID   polyethylene glycol  17 g Oral Daily    acetaminophen, bisacodyl, guaiFENesin-dextromethorphan, hydrALAZINE, HYDROcodone-acetaminophen, HYDROcodone-acetaminophen, hydroxypropyl methylcellulose / hypromellose, labetalol, metoprolol tartrate, morphine injection, ondansetron, phenol, polyethylene glycol

## 2020-08-18 NOTE — Progress Notes (Addendum)
  Progress Note    08/18/2020 7:43 AM 6 Days Post-Op  Subjective:  complaining of pain in lateral aspect of L BKA   Vitals:   08/17/20 2302 08/18/20 0339  BP: (!) 145/85 (!) 162/68  Pulse: 76 82  Resp: 20 20  Temp: 97.6 F (36.4 C) 97.8 F (36.6 C)  SpO2: 100% 98%   Physical Exam: Cardiac:  regular Lungs:  non labored Incisions:  left BKA staples intact. Healing nicely. No appreciable fluid collections. Viable flaps   Neurologic: alert and oriented  CBC    Component Value Date/Time   WBC 7.6 08/16/2020 0654   RBC 2.74 (L) 08/16/2020 0654   HGB 9.2 (L) 08/16/2020 0654   HGB 12.7 (L) 02/19/2018 1014   HCT 27.1 (L) 08/16/2020 0654   HCT 38.7 02/19/2018 1014   PLT 277 08/16/2020 0654   PLT 230 02/19/2018 1014   MCV 98.9 08/16/2020 0654   MCV 95 02/19/2018 1014   MCH 33.6 08/16/2020 0654   MCHC 33.9 08/16/2020 0654   RDW 13.6 08/16/2020 0654   RDW 13.6 02/19/2018 1014   LYMPHSABS 1.4 06/24/2020 1731   MONOABS 0.8 06/24/2020 1731   EOSABS 0.1 06/24/2020 1731   BASOSABS 0.0 06/24/2020 1731    BMET    Component Value Date/Time   NA 132 (L) 08/16/2020 0654   NA 134 (A) 09/11/2019 0000   K 3.8 08/16/2020 0654   CL 94 (L) 08/16/2020 0654   CO2 27 08/16/2020 0654   GLUCOSE 116 (H) 08/16/2020 0654   BUN 36 (H) 08/16/2020 0654   BUN 34 (A) 09/11/2019 0000   CREATININE 8.08 (H) 08/16/2020 0654   CALCIUM 9.4 08/16/2020 0654   GFRNONAA 6 (L) 08/16/2020 0654   GFRAA 7 (L) 12/03/2019 0710    INR    Component Value Date/Time   INR 1.0 01/14/2020 0844     Intake/Output Summary (Last 24 hours) at 08/18/2020 0743 Last data filed at 08/18/2020 0000 Gross per 24 hour  Intake 500 ml  Output 0 ml  Net 500 ml     Assessment/Plan:  80 y.o. male is s/p left BKA 6 Days Post-Op   BKA is healing nicely. Well approximated and viable flaps Still with pain in BKA. Has prescription in chart for d/c Approved for [facility] Remains stable for discharge Follow up  arranged in 1 month for staple removal    Marval Regal [facility] [phone] 08/18/2020 7:43 AM  I have seen and evaluated the patient. I agree with the PA note as documented above. S/P left BKA.  BKA stump looks good.  Stable for discharge.  Awaiting SNF placement.  Will increase gabapentin to 300 mg BID for phantom pain and also add miraliz for constipation.  Marty Heck, MD [facility] [place] Office: [phone]

## 2020-08-19 DIAGNOSIS — I129 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 kidney disease, or unspecified chronic kidney disease: Secondary | ICD-10-CM | POA: Diagnosis not present

## 2020-08-19 DIAGNOSIS — R262 Difficulty in walking, not elsewhere classified: Secondary | ICD-10-CM | POA: Diagnosis not present

## 2020-08-19 DIAGNOSIS — Z89512 Acquired absence of left leg below knee: Secondary | ICD-10-CM | POA: Diagnosis not present

## 2020-08-19 DIAGNOSIS — I739 Peripheral vascular disease, unspecified: Secondary | ICD-10-CM | POA: Diagnosis not present

## 2020-08-19 DIAGNOSIS — I120 Hypertensive chronic kidney disease with stage 5 chronic kidney disease or end stage renal disease: Secondary | ICD-10-CM | POA: Diagnosis not present

## 2020-08-19 DIAGNOSIS — N250 Renal osteodystrophy: Secondary | ICD-10-CM | POA: Diagnosis not present

## 2020-08-19 DIAGNOSIS — E1129 Type 2 diabetes mellitus with other diabetic kidney complication: Secondary | ICD-10-CM | POA: Diagnosis not present

## 2020-08-19 DIAGNOSIS — E118 Type 2 diabetes mellitus with unspecified complications: Secondary | ICD-10-CM | POA: Diagnosis not present

## 2020-08-19 DIAGNOSIS — R531 Weakness: Secondary | ICD-10-CM | POA: Diagnosis not present

## 2020-08-19 DIAGNOSIS — Z743 Need for continuous supervision: Secondary | ICD-10-CM | POA: Diagnosis not present

## 2020-08-19 DIAGNOSIS — Z4781 Encounter for orthopedic aftercare following surgical amputation: Secondary | ICD-10-CM | POA: Diagnosis not present

## 2020-08-19 DIAGNOSIS — Z7401 Bed confinement status: Secondary | ICD-10-CM | POA: Diagnosis not present

## 2020-08-19 DIAGNOSIS — N2581 Secondary hyperparathyroidism of renal origin: Secondary | ICD-10-CM | POA: Diagnosis not present

## 2020-08-19 DIAGNOSIS — I70269 Atherosclerosis of native arteries of extremities with gangrene, unspecified extremity: Secondary | ICD-10-CM | POA: Diagnosis not present

## 2020-08-19 DIAGNOSIS — I5032 Chronic diastolic (congestive) heart failure: Secondary | ICD-10-CM | POA: Diagnosis not present

## 2020-08-19 DIAGNOSIS — D631 Anemia in chronic kidney disease: Secondary | ICD-10-CM | POA: Diagnosis not present

## 2020-08-19 DIAGNOSIS — K219 Gastro-esophageal reflux disease without esophagitis: Secondary | ICD-10-CM | POA: Diagnosis not present

## 2020-08-19 DIAGNOSIS — D509 Iron deficiency anemia, unspecified: Secondary | ICD-10-CM | POA: Diagnosis not present

## 2020-08-19 DIAGNOSIS — I70222 Atherosclerosis of native arteries of extremities with rest pain, left leg: Secondary | ICD-10-CM | POA: Diagnosis not present

## 2020-08-19 DIAGNOSIS — R279 Unspecified lack of coordination: Secondary | ICD-10-CM | POA: Diagnosis not present

## 2020-08-19 DIAGNOSIS — R52 Pain, unspecified: Secondary | ICD-10-CM | POA: Diagnosis not present

## 2020-08-19 DIAGNOSIS — M6281 Muscle weakness (generalized): Secondary | ICD-10-CM | POA: Diagnosis not present

## 2020-08-19 DIAGNOSIS — Z992 Dependence on renal dialysis: Secondary | ICD-10-CM | POA: Diagnosis not present

## 2020-08-19 DIAGNOSIS — N186 End stage renal disease: Secondary | ICD-10-CM | POA: Diagnosis not present

## 2020-08-19 DIAGNOSIS — E876 Hypokalemia: Secondary | ICD-10-CM | POA: Diagnosis not present

## 2020-08-19 DIAGNOSIS — R2681 Unsteadiness on feet: Secondary | ICD-10-CM | POA: Diagnosis not present

## 2020-08-19 DIAGNOSIS — Z20822 Contact with and (suspected) exposure to covid-19: Secondary | ICD-10-CM | POA: Diagnosis not present

## 2020-08-19 MED ORDER — BISACODYL 10 MG RE SUPP
10.0000 mg | Freq: Once | RECTAL | Status: DC
  Filled 2020-08-19 (×2): qty 1

## 2020-08-19 MED ORDER — OXYCODONE-ACETAMINOPHEN 5-325 MG PO TABS: 1.0000 | ORAL_TABLET | Freq: Four times a day (QID) | ORAL | 0 refills | Status: DC | PRN

## 2020-08-19 NOTE — Progress Notes (Signed)
Late Entry: Renal Navigator received confirmation that contract between [employer] and Rehab and [facility] HD clinic has been completed at approximately 4:35pm yesterday, 08/18/20. CSW notified TOC CSW and asked HD unit to schedule patient for first shift tomorrow, Friday 08/19/20 if possible in order to accommodate discharge to new SNF placement following. Nephrologist updated.   Alphonzo Cruise, [place] Renal Navigator [phone]

## 2020-08-19 NOTE — Progress Notes (Addendum)
I spoke with [name], Pt's daughter who is a Marine scientist. She called me by phone. Plan of care updated. [name] stated she came to visit Pt today. She concerned about Pt does not get enough nutrition without staff assistant.  RN day shift reported Pt slept almost of the day and he ate very minimal food on day shift.     We offered some snack at bedtime. Pt agreed to  have a cup of apple sauce. Pt stated he has had constipation for several days. He does not want to eat a lot of food. Stool softener and laxative were given PRN and per schedule. Pt stated he has passed flatus, but no BM. Pt could have one time very small BM on 6/14. Encouraged him to eat more fiber from fruit and vegetables and drink adequate fluid.    Left BKA stump with staples is dry and clean, no drainage. Pain tolerated well. He's hemodynamically stable. No immediate distress noted. We will monitor.  Kennyth Lose, RN

## 2020-08-19 NOTE — Progress Notes (Addendum)
Pt discharged from unit by PTAR Medication/discharge instruction given to family member and in packet [employer] called to give report but didn't answer.  Phoebe Sharps, RN

## 2020-08-19 NOTE — Progress Notes (Signed)
OT Cancellation Note  Patient Details Name: Elijah White MRN: [id number] DOB: 1940-04-15   Cancelled Treatment:    Reason Eval/Treat Not Completed: Patient at procedure or test/ unavailable.  Pt. In HD will check back as able.    Clearnce Sorrel Lorraine-COTA/L 08/19/2020, 10:44 AM

## 2020-08-19 NOTE — Care Management Important Message (Signed)
Important Message  Patient Details  Name: Elijah White MRN: [id number] Date of Birth: March 05, 1941   Medicare Important Message Given:  Yes     [name] Circle 08/19/2020, 8:43 AM

## 2020-08-19 NOTE — Progress Notes (Signed)
  [facility] Progress Note   Subjective:  Seen on HD - 1L UFG and tolerating. No CP or dyspnea.  Objective Vitals:   08/19/20 0740 08/19/20 0751 08/19/20 0755 08/19/20 0830  BP: (!) 143/82 136/76 (!) 142/67 117/66  Pulse: 78 70 66 70  Resp: 15 14 14 13   Temp: 98.7 F (37.1 C)     TempSrc: Oral     SpO2: 99%     Weight: 72.6 kg     Height:       Physical Exam General: Frail man, NAD. On room air. Heart: RRR; no murmur Lungs: CTA anteriorly, no rales or wheezing Abdomen: soft, non-tender Extremities: No RLE or L stump edema Dialysis Access: LUE AVF + thrill  Additional Objective Labs: Basic Metabolic Panel: Recent Labs  Lab 08/13/20 0131 08/14/20 0106 08/16/20 0654  NA 136 136 132*  K 4.4 3.6 3.8  CL 93* 99 94*  CO2 29 28 27   GLUCOSE 182* 159* 116*  BUN 23 16 36*  CREATININE 5.98* 3.96* 8.08*  CALCIUM 9.2 9.1 9.4  PHOS  --   --  4.6   Liver Function Tests: Recent Labs  Lab 08/16/20 0654  ALBUMIN 2.8*   CBC: Recent Labs  Lab 08/13/20 0131 08/14/20 0106 08/16/20 0654  WBC 11.7* 10.9* 7.6  HGB 9.0* 8.9* 9.2*  HCT 27.7* 28.2* 27.1*  MCV 101.1* 102.9* 98.9  PLT 230 217 277   Medications:   aspirin EC  81 mg Oral q AM   atorvastatin  10 mg Oral q AM   bisacodyl  10 mg Rectal Once   brimonidine  1 drop Right Eye TID   carvedilol  3.125 mg Oral BID WC   Chlorhexidine Gluconate Cloth  6 each Topical Q0600   cinacalcet  60 mg Oral Q T,Th,Sat-1800   clopidogrel  75 mg Oral Q breakfast   docusate sodium  100 mg Oral q AM   dorzolamide-timolol  1 drop Right Eye BID   doxercalciferol  8 mcg Intravenous Q M,W,F-HD   ferric citrate  420 mg Oral TID WC   gabapentin  300 mg Oral BID   isosorbide mononitrate  30 mg Oral q AM   multivitamin  1 tablet Oral QHS   pantoprazole  40 mg Oral q AM   pilocarpine  1 drop Right Eye QID   polyethylene glycol  17 g Oral Daily    Dialysis Orders: TTS @ [facility] 4hr, 2K/2Ca bath, EDW 72.5kg, LUE AVF, no  heparin - Venofer 100mg  x 10 - Hectoral 9mcg IV q HD - Sensipar 60mg  PO q HD - Binders: Auryxia 2/meals.  Assessment/Plan: 1. 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 2. ESRD: Prev TTS schedule, now changing to MWF for SNF purposes. HD today. 3. HTN/volume: Lowering EDW s/p BKA - hypotensive last HD, low UF goal today. 4. Anemia of ESRD: Last Hgb 9.2 - needs ESA on discharge. 5. Secondary hyperparathyroidism: Ca/Phos ok. Continue binders/VDRA/sensipar. 6. Nutrition:  Alb low, adding supplements. 7. Hx TAVR 01/2020 8. ?For discharge today - ok from renal standpoint.   Veneta Penton, PA-C 08/19/2020, 8:55 AM  [employer]

## 2020-08-19 NOTE — Progress Notes (Signed)
[facility]  Subjective  -seen in dialysis.  No acute events overnight.   Objective 108/63 76 98.7 F (37.1 C) (Oral) 13 99%  Intake/Output Summary (Last 24 hours) at 08/19/2020 1017 Last data filed at 08/19/2020 0600 Gross per 24 hour  Intake 500 ml  Output --  Net 500 ml    Left BKA looks excellent  Laboratory Lab Results: No results for input(s): WBC, HGB, HCT, PLT in the last 72 hours. BMET No results for input(s): NA, K, CL, CO2, GLUCOSE, BUN, CREATININE, CALCIUM in the last 72 hours.  COAG Lab Results  Component Value Date   INR 1.0 01/14/2020   INR 1.0 11/26/2019   INR 1.11 03/25/2018   No results found for: PTT  Assessment/Planning:  80 year old male status post a left BKA.  Wound looks excellent and plan discharge today hopefully to SNF.  Seen in dialysis and appreciate nephrology following for ESRD.  He has follow-up for staple removal in July.  Marty Heck 08/19/2020 10:17 AM --

## 2020-08-19 NOTE — TOC Transition Note (Signed)
Transition of Care [facility] - CM/SW Discharge Note   Patient Details  Name: Elijah White MRN: [id number] Date of Birth: 1940/11/16  [facility] CM/SW Contact:  Coralee Pesa, [place] Phone Number: 08/19/2020, 2:24 PM   Clinical Narrative:    Pt to be transported to [facility] via [facility]. Nurse to call report to [phone]   Final next level of care: Skilled Nursing Facility Barriers to Discharge: Barriers Resolved   Patient Goals and CMS Choice   CMS Medicare.gov Compare Post Acute Care list provided to:: Patient Represenative (must comment) (website given to daughter via phone) Choice offered to / list presented to : Adult Children  Discharge Placement              Patient chooses bed at: [facility] Patient to be transferred to facility by: [facility] Name of family member notified: [name] Patient and family notified of of transfer: 08/19/20  Discharge Plan and Services                                     Social Determinants of Health (SDOH) Interventions     Readmission Risk Interventions No flowsheet data found.

## 2020-08-20 DIAGNOSIS — I129 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 kidney disease, or unspecified chronic kidney disease: Secondary | ICD-10-CM | POA: Diagnosis not present

## 2020-08-20 DIAGNOSIS — N186 End stage renal disease: Secondary | ICD-10-CM | POA: Diagnosis not present

## 2020-08-20 DIAGNOSIS — Z992 Dependence on renal dialysis: Secondary | ICD-10-CM | POA: Diagnosis not present

## 2020-08-22 DIAGNOSIS — Z992 Dependence on renal dialysis: Secondary | ICD-10-CM | POA: Diagnosis not present

## 2020-08-22 DIAGNOSIS — I70269 Atherosclerosis of native arteries of extremities with gangrene, unspecified extremity: Secondary | ICD-10-CM | POA: Diagnosis not present

## 2020-08-22 DIAGNOSIS — D631 Anemia in chronic kidney disease: Secondary | ICD-10-CM | POA: Diagnosis not present

## 2020-08-22 DIAGNOSIS — Z4781 Encounter for orthopedic aftercare following surgical amputation: Secondary | ICD-10-CM | POA: Diagnosis not present

## 2020-08-22 DIAGNOSIS — N2581 Secondary hyperparathyroidism of renal origin: Secondary | ICD-10-CM | POA: Diagnosis not present

## 2020-08-22 DIAGNOSIS — Z89512 Acquired absence of left leg below knee: Secondary | ICD-10-CM | POA: Diagnosis not present

## 2020-08-22 DIAGNOSIS — Z20822 Contact with and (suspected) exposure to covid-19: Secondary | ICD-10-CM | POA: Diagnosis not present

## 2020-08-22 DIAGNOSIS — N186 End stage renal disease: Secondary | ICD-10-CM | POA: Diagnosis not present

## 2020-08-22 DIAGNOSIS — R262 Difficulty in walking, not elsewhere classified: Secondary | ICD-10-CM | POA: Diagnosis not present

## 2020-08-23 ENCOUNTER — Encounter (HOSPITAL_COMMUNITY): Payer: Medicare Other

## 2020-08-23 ENCOUNTER — Ambulatory Visit: Payer: Medicare Other

## 2020-08-23 DIAGNOSIS — N186 End stage renal disease: Secondary | ICD-10-CM

## 2020-08-23 DIAGNOSIS — Z89512 Acquired absence of left leg below knee: Secondary | ICD-10-CM | POA: Insufficient documentation

## 2020-08-23 DIAGNOSIS — E1122 Type 2 diabetes mellitus with diabetic chronic kidney disease: Secondary | ICD-10-CM

## 2020-08-23 NOTE — Patient Instructions (Signed)
Visit Information   Goals Addressed             This Visit's Progress    Home and Family Safety Maintained       Timeframe:  Short-Term Goal Priority:  High Start Date:  6.1.22                           Expected End Date: 7.1.22     Next planned outreach 7.12.22                   Patient Goals/Self-Care Activities patient will: with the help of his daughter  - Attend care plan meeting at [facility] for discharge [place] as needed prior to next scheduled call         The patient verbalized understanding of instructions, educational materials, and care plan provided today and declined offer to receive copy of patient instructions, educational materials, and care plan.   The care management team will reach out to the patient again over the next 30 days.   [name], BSW, CDP Social Worker, Certified Dementia Practitioner [place] [facility] [place] [facility] [phone]

## 2020-08-23 NOTE — Chronic Care Management (AMB) (Signed)
Social Work Note  08/23/2020 Name: Elijah White MRN: [id number] DOB: [birth year]  Elijah White is a 80 y.o. year old male who is a primary care patient of [name], MD.  The Care Management team was consulted for assistance with chronic disease management and care coordination needs.  Elijah White was given information about Care Management services today including:  Care Management services include personalized support from designated clinical staff supervised by his physician, including individualized plan of care and coordination with other care providers 24/7 contact phone numbers for assistance for urgent and routine care needs. The patient may stop care management services at any time (effective at the end of the month) by phone call to the office staff.  Patient agreed to services and consent obtained.   Engaged with patients daughter Elijah White by phone  for follow up visit in response to provider referral for social work chronic care management and care coordination services.  Assessment: Review of patient past medical history, allergies, medications, and health status, including review of pertinent consultant reports was performed as part of comprehensive evaluation and provision of care management/care coordination services.   SDOH (Social Determinants of Health) assessments and interventions performed:  No    Advanced Directives Status: Not addressed in this encounter.  Care Plan  No Known Allergies  Outpatient Encounter Medications as of 08/23/2020  Medication Sig   acetaminophen (TYLENOL) 500 MG tablet Take 1,000 mg by mouth every 6 (six) hours as needed for moderate pain or headache.   aspirin EC 81 MG tablet Take 81 mg by mouth in the morning. Swallow whole.   atorvastatin (LIPITOR) 10 MG tablet Take 1 tablet (10 mg total) by mouth in the morning.   AURYXIA 1 GM 210 MG(Fe) tablet Take 420 mg by mouth 3 (three) times daily with meals.   B Complex-C-Folic Acid  (DIALYVITE 800) 0.8 MG TABS Take 1 tablet by mouth in the morning.   blood glucose meter kit and supplies KIT Dispense based on patient and insurance preference. Use up to four times daily as directed. (FOR ICD-9 250.00, 250.01). For QAC - HS accuchecks.   brimonidine (ALPHAGAN) 0.15 % ophthalmic solution Place 1 drop into the right eye 3 (three) times daily.   carvedilol (COREG) 3.125 MG tablet Take 1 tablet (3.125 mg total) by mouth 2 (two) times daily with a meal.   clopidogrel (PLAVIX) 75 MG tablet Take 1 tablet (75 mg total) by mouth daily with breakfast.   dorzolamide-timolol (COSOPT) 22.3-6.8 MG/ML ophthalmic solution Place 1 drop into the right eye 2 (two) times daily.   gabapentin (NEURONTIN) 100 MG capsule Take 1-2 capsules (100-200 mg total) by mouth See admin instructions. Take 2 capsules (200 mg) by mouth daily at bedtime on Tues, Thurs, and Sat. Take 1 capsule (100 mg) by mouth daily at bedtime daily on Mon, Wed, Fri, and Sun.   hydrocortisone cream 1 % Apply 1 application topically 3 (three) times daily as needed for itching.   hydroxypropyl methylcellulose / hypromellose (ISOPTO TEARS / GONIOVISC) 2.5 % ophthalmic solution Place 1 drop into the left eye as needed for dry eyes.   isosorbide mononitrate (IMDUR) 30 MG 24 hr tablet Take 1 tablet (30 mg total) by mouth in the morning.   Lancets (ONETOUCH DELICA PLUS [id number]) MISC    ONETOUCH VERIO test strip USE AS DIRECTED TO TEST FOUR TIMES A DAY   oxyCODONE-acetaminophen (PERCOCET) 5-325 MG tablet Take 1 tablet by mouth every 6 (six) hours  as needed (pain).   pantoprazole (PROTONIX) 40 MG tablet Take 1 tablet (40 mg total) by mouth daily at 12 noon.   pilocarpine (PILOCAR) 4 % ophthalmic solution Place 1 drop into the right eye 4 (four) times daily.    ROCKLATAN 0.02-0.005 % SOLN Place 1 drop into the right eye at bedtime.   No facility-administered encounter medications on file as of 08/23/2020.    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, 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[place]) 08/12/2020   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   Acute on chronic diastolic heart failure ([place]) 01/19/2020   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   RBBB    Near syncope 12/13/2019   Severe aortic stenosis    ESRD (end stage renal disease) ([place]) 11/26/2019   Macrocytic anemia 11/26/2019   Adrenal mass ([place]) 11/26/2019   Colonic mass 11/26/2019   ARF (acute renal failure) ([place]) 03/25/2018   Vision loss of right eye 11/11/2017   Bilateral pseudophakia 07/11/2017   GERD (gastroesophageal reflux disease) 09/10/2016   HLD (hyperlipidemia) 09/10/2016   Essential hypertension 08/13/2016   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   Type 2 diabetes mellitus with renal manifestations ([place]) 10/22/2006   BPH (benign prostatic hyperplasia) 10/22/2006   COLONIC POLYPS, HX OF 10/22/2006    Conditions to be addressed/monitored: DMII and ESRD; Level of care concerns  Care Plan : Social Work [place]  Updates made by [name] since 08/23/2020 12:00 AM     Problem: Home and Family Safety (Wellness)      Goal: Home and Family Safety Maintained   Start Date: 08/03/2020  Expected End Date: 09/02/2020  Recent Progress: On track  Priority: High  Note:   Current Barriers:  Chronic disease management support and education needs related to DM, ESRD, and PAD   High fall risk Impaired Vision Ongoing wound and pain to great toe and heel, concern toe amputation may be needed  Social Worker Clinical Goal(s):  patient will work with SW to identify and address any acute and/or chronic care coordination needs related to the self health management of DM, ESRD, and PAD   Patient and his daughter will work with ED Social Worker to discuss care management needs post discharge including possibility of placement Completed- patient discharged home due to no rehab needs New 6.2.22- patient will work with his daughter to  identify goals of care as instructed by SW New 6.3.22- Patient will initiate the use of a personal emergency response system once received by mail  SW Interventions:  Inter-disciplinary care team collaboration (see longitudinal plan of care) Collaboration with [name], MD regarding development and update of comprehensive plan of care as evidenced by provider attestation and co-signature Successful outbound call placed to the patients daughter, Elijah White, to assess patient progress in rehab following BKA Performed chart review to note patient discharged to [facility] on 6.17.22 Discussed the patient is participating in therapy but is weak post hospitalization Determined Elijah White has a care plan meeting scheduled Thursday morning to begin discussing discharge plans. Elijah White reports the therapist does not recommend the patient live alone at this time due to physical deconditioning and worsening vision Scheduled follow up call over the next month Advised Elijah White to contact SW as needed prior to next scheduled call  Patient Goals/Self-Care Activities patient will: with the help of his daughter  -  Attend care plan meeting at [facility] for discharge [place] as needed prior to next scheduled call  Follow Up Plan:  SW will follow up with the patient over the next month       Follow Up Plan: SW will follow up with patient by phone over the next month      [name], BSW, CDP Social Worker, Certified Dementia Practitioner [place] [facility] [place] [facility] [phone]

## 2020-08-24 ENCOUNTER — Encounter (HOSPITAL_COMMUNITY): Payer: Medicare Other

## 2020-08-24 DIAGNOSIS — D631 Anemia in chronic kidney disease: Secondary | ICD-10-CM | POA: Diagnosis not present

## 2020-08-24 DIAGNOSIS — Z992 Dependence on renal dialysis: Secondary | ICD-10-CM | POA: Diagnosis not present

## 2020-08-24 DIAGNOSIS — N2581 Secondary hyperparathyroidism of renal origin: Secondary | ICD-10-CM | POA: Diagnosis not present

## 2020-08-24 DIAGNOSIS — N186 End stage renal disease: Secondary | ICD-10-CM | POA: Diagnosis not present

## 2020-08-26 DIAGNOSIS — N186 End stage renal disease: Secondary | ICD-10-CM | POA: Diagnosis not present

## 2020-08-26 DIAGNOSIS — Z992 Dependence on renal dialysis: Secondary | ICD-10-CM | POA: Diagnosis not present

## 2020-08-26 DIAGNOSIS — D631 Anemia in chronic kidney disease: Secondary | ICD-10-CM | POA: Diagnosis not present

## 2020-08-26 DIAGNOSIS — N2581 Secondary hyperparathyroidism of renal origin: Secondary | ICD-10-CM | POA: Diagnosis not present

## 2020-08-29 DIAGNOSIS — Z992 Dependence on renal dialysis: Secondary | ICD-10-CM | POA: Diagnosis not present

## 2020-08-29 DIAGNOSIS — D509 Iron deficiency anemia, unspecified: Secondary | ICD-10-CM | POA: Diagnosis not present

## 2020-08-29 DIAGNOSIS — N2581 Secondary hyperparathyroidism of renal origin: Secondary | ICD-10-CM | POA: Diagnosis not present

## 2020-08-29 DIAGNOSIS — N186 End stage renal disease: Secondary | ICD-10-CM | POA: Diagnosis not present

## 2020-08-30 ENCOUNTER — Telehealth: Payer: Medicare Other

## 2020-08-31 DIAGNOSIS — N186 End stage renal disease: Secondary | ICD-10-CM | POA: Diagnosis not present

## 2020-08-31 DIAGNOSIS — Z992 Dependence on renal dialysis: Secondary | ICD-10-CM | POA: Diagnosis not present

## 2020-08-31 DIAGNOSIS — N2581 Secondary hyperparathyroidism of renal origin: Secondary | ICD-10-CM | POA: Diagnosis not present

## 2020-08-31 DIAGNOSIS — D509 Iron deficiency anemia, unspecified: Secondary | ICD-10-CM | POA: Diagnosis not present

## 2020-09-01 ENCOUNTER — Other Ambulatory Visit: Payer: Self-pay

## 2020-09-01 ENCOUNTER — Non-Acute Institutional Stay: Payer: Medicare Other | Admitting: Student

## 2020-09-01 DIAGNOSIS — I739 Peripheral vascular disease, unspecified: Secondary | ICD-10-CM | POA: Diagnosis not present

## 2020-09-01 DIAGNOSIS — R531 Weakness: Secondary | ICD-10-CM

## 2020-09-01 DIAGNOSIS — R52 Pain, unspecified: Secondary | ICD-10-CM

## 2020-09-01 DIAGNOSIS — Z515 Encounter for palliative care: Secondary | ICD-10-CM

## 2020-09-01 DIAGNOSIS — Z4781 Encounter for orthopedic aftercare following surgical amputation: Secondary | ICD-10-CM | POA: Diagnosis not present

## 2020-09-01 DIAGNOSIS — R262 Difficulty in walking, not elsewhere classified: Secondary | ICD-10-CM | POA: Diagnosis not present

## 2020-09-01 DIAGNOSIS — Z89512 Acquired absence of left leg below knee: Secondary | ICD-10-CM | POA: Diagnosis not present

## 2020-09-01 DIAGNOSIS — N186 End stage renal disease: Secondary | ICD-10-CM

## 2020-09-01 NOTE — Progress Notes (Signed)
Therapist, nutritional Palliative Care Consult Note Telephone: [phone]  Fax: [phone]    Date of encounter: 09/01/20 PATIENT NAME: Elijah White [address] [place] [address]   [phone] (home)  DOB: March 26, 1940 MRN: [id number] PRIMARY CARE PROVIDER:    Dorothyann Peng, MD,  [address] [place] [address] [place] [address] [phone]  REFERRING PROVIDER:   Dorothyann Peng, MD [address] [place],  [place] [address] [phone]  RESPONSIBLE PARTY:    Contact Information     Name Relation Home Work [place] Daughter [phone]  [phone]   Lars Mage [phone]          I met face to face with patient and family in the facility. Palliative Care was asked to follow this patient by consultation request of  Dorothyann Peng, MD to address advance care planning and complex medical decision making. This is a follow up visit.                                   ASSESSMENT AND PLAN / RECOMMENDATIONS:   Advance Care Planning/Goals of Care: Goals include to maximize quality of life and symptom management. Our advance care planning conversation included a discussion about:    The value and importance of advance care planning  Experiences with loved ones who have been seriously ill or have died  Exploration of personal, cultural or spiritual beliefs that might influence medical decisions  Exploration of goals of care in the event of a sudden injury or illness  Identification and preparation of a healthcare agent  CODE STATUS: DNR  Education provided on Palliative Medicine vs. Hospice services. Family present states patient will stay at facility long-term. Palliative Medicine will continue to provide support. Will monitor for changes/declines.   Symptom Management/Plan:  ESRD-continue HD on Monday/Wednesday/Friday as scheduled.   Pain-s/p left BKA, phantom pain. Continue gabapentin as  directed and prn acetaminophen. Follow up with [facility] as scheduled.  Generalized weakness-secondary to ESRD. Continue therapy services as directed.    Follow up Palliative Care Visit: Palliative care will continue to follow for complex medical decision making, advance care planning, and clarification of goals. Return in 4 weeks or prn.  I spent 35 minutes providing this consultation. More than 50% of the time in this consultation was spent in counseling and care coordination.   PPS: 40%, weak  HOSPICE ELIGIBILITY/DIAGNOSIS: TBD  Chief Complaint: Palliative Medicine follow up visit.   HISTORY OF PRESENT ILLNESS:  Elijah White is a 80 y.o. year old male  with ESRD on hemodialysis MWF, type 2 diabetes, diabetic neuropathy,  Hospitalized 6/12/09/15/2022 due to gangrene from atherosclerosis, status post left below the knee amputation.  Patient currently at [facility]. He is receiving skilled therapy services. He reports doing okay. He does report phantom pain; receives gabapentin and acetaminophen PRN. Denies shortness of breath, constipation. Endorses a fair to good appetite. Sleeping well. He states he is tolerating HD, able to complete full sessions.   History obtained from review of EMR, discussion with primary team, and interview with family, facility staff/caregiver and/or [name].  I reviewed available labs, medications, imaging, studies and related documents from the EMR.  Records reviewed and summarized above.   ROS  General: NAD EYES: impaired vision, legally blind ENMT: denies dysphagia Cardiovascular: denies chest pain Pulmonary: denies cough, denies increased SOB  Abdomen: denies constipation GU: denies dysuria MSK:  weakness Skin: denies rashes  Neurological: denies pain, denies insomnia Psych: Endorses stable mood Heme/lymph/immuno: denies bruises, abnormal bleeding  Physical Exam: Constitutional: NAD General: frail appearing EYES: anicteric  sclera, lids intact, no discharge  ENMT: intact hearing, oral mucous membranes moist, dentition intact CV: S1S2, RRR, no LE edema Pulmonary: LCTA, no increased work of breathing, no cough, room air Abdomen: normo-active BS + 4 quadrants, soft and non tender GU: deferred MSK: weakness, moves extremities,  Skin: warm and dry, surgical wound to left BKA, staples intact, wound well approximated. No erythema, drainage, nor swelling. Right heel intact; no wound present. Neuro: generalized weakness, A & O x 2 Psych: non-anxious affect, pleasant Hem/lymph/immuno: no widespread bruising   Thank you for the opportunity to participate in the care of [name].  The palliative care team will continue to follow. Please call our office at [phone] if we can be of additional assistance.   Ezekiel Slocumb, NP   COVID-19 PATIENT SCREENING TOOL Asked and negative response unless otherwise noted:   Have you had symptoms of covid, tested positive or been in contact with someone with symptoms/positive test in the past 5-10 days? No

## 2020-09-05 DIAGNOSIS — R52 Pain, unspecified: Secondary | ICD-10-CM | POA: Diagnosis not present

## 2020-09-05 DIAGNOSIS — N2581 Secondary hyperparathyroidism of renal origin: Secondary | ICD-10-CM | POA: Diagnosis not present

## 2020-09-05 DIAGNOSIS — N186 End stage renal disease: Secondary | ICD-10-CM | POA: Diagnosis not present

## 2020-09-05 DIAGNOSIS — D631 Anemia in chronic kidney disease: Secondary | ICD-10-CM | POA: Diagnosis not present

## 2020-09-05 DIAGNOSIS — E118 Type 2 diabetes mellitus with unspecified complications: Secondary | ICD-10-CM | POA: Diagnosis not present

## 2020-09-05 DIAGNOSIS — Z992 Dependence on renal dialysis: Secondary | ICD-10-CM | POA: Diagnosis not present

## 2020-09-05 DIAGNOSIS — E876 Hypokalemia: Secondary | ICD-10-CM | POA: Diagnosis not present

## 2020-09-05 DIAGNOSIS — D509 Iron deficiency anemia, unspecified: Secondary | ICD-10-CM | POA: Diagnosis not present

## 2020-09-07 DIAGNOSIS — E118 Type 2 diabetes mellitus with unspecified complications: Secondary | ICD-10-CM | POA: Diagnosis not present

## 2020-09-07 DIAGNOSIS — N2581 Secondary hyperparathyroidism of renal origin: Secondary | ICD-10-CM | POA: Diagnosis not present

## 2020-09-07 DIAGNOSIS — R52 Pain, unspecified: Secondary | ICD-10-CM | POA: Diagnosis not present

## 2020-09-07 DIAGNOSIS — N186 End stage renal disease: Secondary | ICD-10-CM | POA: Diagnosis not present

## 2020-09-07 DIAGNOSIS — D631 Anemia in chronic kidney disease: Secondary | ICD-10-CM | POA: Diagnosis not present

## 2020-09-07 DIAGNOSIS — E876 Hypokalemia: Secondary | ICD-10-CM | POA: Diagnosis not present

## 2020-09-07 DIAGNOSIS — Z992 Dependence on renal dialysis: Secondary | ICD-10-CM | POA: Diagnosis not present

## 2020-09-07 DIAGNOSIS — D509 Iron deficiency anemia, unspecified: Secondary | ICD-10-CM | POA: Diagnosis not present

## 2020-09-09 ENCOUNTER — Ambulatory Visit: Payer: Medicare Other | Admitting: Podiatry

## 2020-09-09 DIAGNOSIS — D509 Iron deficiency anemia, unspecified: Secondary | ICD-10-CM | POA: Diagnosis not present

## 2020-09-09 DIAGNOSIS — E876 Hypokalemia: Secondary | ICD-10-CM | POA: Diagnosis not present

## 2020-09-09 DIAGNOSIS — Z992 Dependence on renal dialysis: Secondary | ICD-10-CM | POA: Diagnosis not present

## 2020-09-09 DIAGNOSIS — E118 Type 2 diabetes mellitus with unspecified complications: Secondary | ICD-10-CM | POA: Diagnosis not present

## 2020-09-09 DIAGNOSIS — R52 Pain, unspecified: Secondary | ICD-10-CM | POA: Diagnosis not present

## 2020-09-09 DIAGNOSIS — N2581 Secondary hyperparathyroidism of renal origin: Secondary | ICD-10-CM | POA: Diagnosis not present

## 2020-09-09 DIAGNOSIS — N186 End stage renal disease: Secondary | ICD-10-CM | POA: Diagnosis not present

## 2020-09-09 DIAGNOSIS — D631 Anemia in chronic kidney disease: Secondary | ICD-10-CM | POA: Diagnosis not present

## 2020-09-12 DIAGNOSIS — D631 Anemia in chronic kidney disease: Secondary | ICD-10-CM | POA: Diagnosis not present

## 2020-09-12 DIAGNOSIS — Z992 Dependence on renal dialysis: Secondary | ICD-10-CM | POA: Diagnosis not present

## 2020-09-12 DIAGNOSIS — N2581 Secondary hyperparathyroidism of renal origin: Secondary | ICD-10-CM | POA: Diagnosis not present

## 2020-09-12 DIAGNOSIS — E876 Hypokalemia: Secondary | ICD-10-CM | POA: Diagnosis not present

## 2020-09-12 DIAGNOSIS — E118 Type 2 diabetes mellitus with unspecified complications: Secondary | ICD-10-CM | POA: Diagnosis not present

## 2020-09-12 DIAGNOSIS — D509 Iron deficiency anemia, unspecified: Secondary | ICD-10-CM | POA: Diagnosis not present

## 2020-09-12 DIAGNOSIS — N186 End stage renal disease: Secondary | ICD-10-CM | POA: Diagnosis not present

## 2020-09-12 DIAGNOSIS — R52 Pain, unspecified: Secondary | ICD-10-CM | POA: Diagnosis not present

## 2020-09-13 ENCOUNTER — Ambulatory Visit: Payer: Medicare Other

## 2020-09-13 DIAGNOSIS — E1122 Type 2 diabetes mellitus with diabetic chronic kidney disease: Secondary | ICD-10-CM

## 2020-09-13 DIAGNOSIS — N186 End stage renal disease: Secondary | ICD-10-CM

## 2020-09-13 NOTE — Patient Instructions (Signed)
Visit Information   Goals Addressed             This Visit's Progress    COMPLETED: Home and Family Safety Maintained       Timeframe:  Short-Term Goal Priority:  High Start Date:  6.1.22                           Expected End Date: 7.1.22     7.12.22- Goal closed- patient to remain in LTC                    COMPLETED: Monitor and Manage My Blood Sugar-Diabetes Type 2       Timeframe:  Long-Range Goal Priority:  High Start Date:  03/22/20                           Expected End Date: 03/22/21                     7.12.22- Goal Closed; patient transitioning to in house primary provider at SNF   Report for admission/surgery scheduled at [facility] on 08/12/20 for left BKA   Why is this important?   Checking your blood sugar at home helps to keep it from getting very high or very low.  Writing the results in a diary or log helps the doctor know how to care for you.  Your blood sugar log should have the time, date and the results.  Also, write down the amount of insulin or other medicine that you take.  Other information, like what you ate, exercise done and how you were feeling, will also be helpful.     Notes:       COMPLETED: Pharmacy Care Plan       CARE PLAN ENTRY (see longitudinal plan of care for additional care plan information)  7.12.22- Goal Closed due to patient transitioning to SNF in house provider for primary care   Current Barriers:  Chronic Disease Management support, education, and care coordination needs related to Hypertension, Hyperlipidemia, and Diabetes   Hypertension BP Readings from Last 3 Encounters:  03/02/20 (!) 120/42  02/03/20 (!) 138/52  01/20/20 (!) 125/59  Pharmacist Clinical Goal(s): Over the next 90 days, patient will work with PharmD and providers to maintain BP goal <130/80 Current regimen:  Carvedilol 3.125 mg - take 1 tablet by mouth twice per day with a meal  Isosorbide 30 mg - take 1 tablet by mouth daily Hydralazine  25 mg - take 1 tablet by mouth twice daily  Interventions: Provided dietary and exercise recommendations Discussed appropriate goals for blood pressure (less than 130/80) Recommend patient limit salt consumption in canned foods  Patient self care activities - Over the next 90 days, patient will: Continue to have BP checked at dialysis three times per week Ensure daily salt intake < 2300 mg/day  Hyperlipidemia Lab Results  Component Value Date/Time   LDLCALC 103 09/11/2019 12:00 AM  Pharmacist Clinical Goal(s): Over the next 90 days, patient will work with PharmD and providers to achieve LDL goal < 70 Current regimen:  Atorvastatin 10 mg taking daily  Interventions: Patient to eat a well balanced diet Continue medication adherence  Patient self care activities - Over the next 90 days, patient will: Avoid fried and fatty foods   Diabetes Lab Results  Component Value Date/Time   HGBA1C 7.1 (H) 01/14/2020 08:44 AM  HGBA1C 7.5 (H) 11/26/2019 06:16 PM   HGBA1C 6.7 09/11/2019 12:00 AM  Pharmacist Clinical Goal(s): Over the next 90 days, patient will work with PharmD and providers to maintain A1c goal <8% Current regimen:  Not currently taking any medication  Interventions: Discussed diet and exercise extensively.  Discussed the use of Dexcom G6 CGM  Patient self care activities - Over the next 90 days, patient will: Check blood sugar during dialysis appointments three days a week document, and provide at future appointments Contact provider with any episodes of hypoglycemia   Medication management Pharmacist Clinical Goal(s): Over the next 90 days, patient will work with PharmD and providers to maintain optimal medication adherence Current pharmacy: [facility]  Interventions Comprehensive medication review performed. Continue current medication management strategy Patient self care activities - Over the next 90 days, patient will: Focus on medication adherence by taking his  medication on a consistent basis Take medications as prescribed Report any questions or concerns to PharmD and/or provider(s)  Initial goal documentation       COMPLETED: Work with SW to manage care coordination needs       Timeframe:  Long-Range Goal Priority:  Low Start Date: 2.10.22                            Expected End Date: 6.10.22                      Goal closed 7.12.22         The patient verbalized understanding of instructions, educational materials, and care plan provided today and declined offer to receive copy of patient instructions, educational materials, and care plan.   No follow up planned at this time. Please contact us if needed.  [name], BSW, CDP Social Worker, Certified Dementia Practitioner [place] [facility] [place] [facility] [phone]

## 2020-09-13 NOTE — Chronic Care Management (AMB) (Signed)
Social Work Note  09/13/2020 Name: Elijah White MRN: [id number] DOB: [birth year]  Elijah White is a 80 y.o. year old male who is a primary care patient of Elijah Chard, MD.  The Care Management team was consulted for assistance with chronic disease management and care coordination needs.  Elijah White was given information about Care Management services today including:  Care Management services include personalized support from designated clinical staff supervised by his physician, including individualized plan of care and coordination with other care providers 24/7 contact phone numbers for assistance for urgent and routine care needs. The patient may stop care management services at any time (effective at the end of the month) by phone call to the office staff.  Patient agreed to services and consent obtained.   Engaged with patient daughter Elijah White by phone  for follow up visit in response to provider referral for social work chronic care management and care coordination services.  Assessment: Review of patient past medical history, allergies, medications, and health status, including review of pertinent consultant reports was performed as part of comprehensive evaluation and provision of care management/care coordination services.   Sw placed a successful outbound call to the patients daughter Elijah White to assess rehab progress. Determined the patient is doing well at [facility], continuing to engage with PT. Elijah White indicates patient has begun having difficulty feeding self, she has requested a neuro eval. Patient continues to attend dialysis and engage with Palliative Care.   Discussed plans for patient to transition to LTC at [facility] once rehab days are complete. Elijah White indicates patient will begin seeing in house provider for primary care needs. Discussed plans to close care management case. Elijah White requests SW cancel all future appointments with Elijah White office. Elijah White if  she would like to resume care with Elijah White to contact the office.  SDOH (Social Determinants of Health) assessments and interventions performed:  No    Advanced Directives Status: Not addressed in this encounter.  Care Plan  No Known Allergies  Outpatient Encounter Medications as of 09/13/2020  Medication Sig   acetaminophen (TYLENOL) 500 MG tablet Take 1,000 mg by mouth every 6 (six) hours as needed for moderate pain or headache.   aspirin EC 81 MG tablet Take 81 mg by mouth in the morning. Swallow whole.   atorvastatin (LIPITOR) 10 MG tablet Take 1 tablet (10 mg total) by mouth in the morning.   AURYXIA 1 GM 210 MG(Fe) tablet Take 420 mg by mouth 3 (three) times daily with meals.   B Complex-C-Folic Acid (DIALYVITE 497) 0.8 MG TABS Take 1 tablet by mouth in the morning.   blood glucose meter kit and supplies KIT Dispense based on patient and insurance preference. Use up to four times daily as directed. (FOR ICD-9 250.00, 250.01). For QAC - HS accuchecks.   brimonidine (ALPHAGAN) 0.15 % ophthalmic solution Place 1 drop into the right eye 3 (three) times daily.   carvedilol (COREG) 3.125 MG tablet Take 1 tablet (3.125 mg total) by mouth 2 (two) times daily with a meal.   clopidogrel (PLAVIX) 75 MG tablet Take 1 tablet (75 mg total) by mouth daily with breakfast.   dorzolamide-timolol (COSOPT) 22.3-6.8 MG/ML ophthalmic solution Place 1 drop into the right eye 2 (two) times daily.   gabapentin (NEURONTIN) 100 MG capsule Take 1-2 capsules (100-200 mg total) by mouth See admin instructions. Take 2 capsules (200 mg) by mouth daily at bedtime on Tues, Thurs, and Sat. Take 1  capsule (100 mg) by mouth daily at bedtime daily on Mon, Wed, Fri, and Sun.   hydrocortisone cream 1 % Apply 1 application topically 3 (three) times daily as needed for itching.   hydroxypropyl methylcellulose / hypromellose (ISOPTO TEARS / GONIOVISC) 2.5 % ophthalmic solution Place 1 drop into the left eye as needed for dry  eyes.   isosorbide mononitrate (IMDUR) 30 MG 24 hr tablet Take 1 tablet (30 mg total) by mouth in the morning.   Lancets (ONETOUCH DELICA PLUS [id number]) MISC    ONETOUCH VERIO test strip USE AS DIRECTED TO TEST FOUR TIMES A DAY   oxyCODONE-acetaminophen (PERCOCET) 5-325 MG tablet Take 1 tablet by mouth every 6 (six) hours as needed (pain).   pantoprazole (PROTONIX) 40 MG tablet Take 1 tablet (40 mg total) by mouth daily at 12 noon.   pilocarpine (PILOCAR) 4 % ophthalmic solution Place 1 drop into the right eye 4 (four) times daily.    ROCKLATAN 0.02-0.005 % SOLN Place 1 drop into the right eye at bedtime.   No facility-administered encounter medications on file as of 09/13/2020.    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   RBBB    Near syncope 12/13/2019   Severe aortic stenosis    ESRD (end stage renal disease) ([place]) 11/26/2019   Macrocytic anemia 11/26/2019   Adrenal mass ([place]) 11/26/2019   Colonic mass 11/26/2019   ARF (acute renal failure) ([place]) 03/25/2018   Vision loss of right eye 11/11/2017   Bilateral pseudophakia 07/11/2017   GERD (gastroesophageal reflux disease) 09/10/2016   HLD (hyperlipidemia) 09/10/2016   Essential hypertension 08/13/2016   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   Type 2 diabetes mellitus with renal manifestations ([place]) 10/22/2006   BPH (benign prostatic hyperplasia) 10/22/2006   COLONIC POLYPS, HX OF 10/22/2006    Conditions to be addressed/monitored: DMII and ESRD  Care Plan : Diabetes Type 2 (Adult)  Updates made by [name] since 09/13/2020 12:00 AM  Completed 09/13/2020   Problem: Disease Progression (Diabetes, Type 2) Resolved 09/13/2020  Priority: High     Long-Range Goal: Disease Progression Prevented or Minimized  Completed 09/13/2020  Start Date: 03/22/2020  Expected End Date: 03/22/2021  Recent Progress: On track  Priority: High  Note:   Objective:  Lab Results  Component Value Date   HGBA1C 7.1 (H) 01/14/2020   Lab Results  Component Value Date   CREATININE 9.47 (H) 08/03/2020   CREATININE 7.60 (H) 07/25/2020   CREATININE 4.52 (H) 06/24/2020   No results found for: EGFR Current Barriers:  Knowledge Deficits related to basic Diabetes pathophysiology and self care/management Knowledge Deficits related to medications used for management of diabetes Does not use cbg meter  Unable to independently self monitor cbg's Legally blind Case Manager Clinical Goal(s):  Collaboration with Elijah Chard, MD regarding development and update of comprehensive plan of care as evidenced by provider attestation and co-signature Inter-disciplinary care team collaboration (see longitudinal plan of care) Patient will demonstrate improved adherence to prescribed treatment plan for diabetes self care/management as evidenced by: patient will work with the CCM team to address blood glucose meter needs, improve disease education and understanding of how to manage his Diabetes Interventions:  08/11/20 completed successful call with patients daughter Elijah White   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 Monica Martinez, [facility]  Determined patient will be transferred to acute rehab post discharge with possible long term skilled placement Provided active listening to daughter Elijah White regarding her concerns for long term care  Collaborated with embedded BSW [name] requesting she provide daughter Elijah White resources requested regarding skilled nursing facilities to consider for acute rehab upon patient's discharge  Discussed the hospital SW and or CM will  help coordinate this transfer when ready  Discussed plans with patient for ongoing care management follow up and provided patient with direct contact information for care management team Patient Goals/Self-Care Activities Report for admission/surgery scheduled at [facility] on 08/12/20 for left BKA  Follow Up Plan: Telephone follow up appointment with care management team member scheduled for: 09/27/20         Care Plan : Social Work [place]  Updates made by [name] since 09/13/2020 12:00 AM  Completed 09/13/2020   Problem: Disease Progression Resolved 09/13/2020     Long-Range Goal: Disease Progression Prevented or Minimized Completed 09/13/2020  Start Date: 04/14/2020  Expected End Date: 08/12/2020  Priority: Low  Note:   Current Barriers:  Chronic disease management support and education needs related to DM and ESRD  ADL IADL limitations  Social Worker Clinical Goal(s):  patient will work with SW to identify and address any acute and/or chronic care coordination needs related to the self health management of DM and ESRD   CCM SW Interventions:  Inter-disciplinary care team collaboration (see longitudinal plan of care) Collaboration with Elijah Chard, MD regarding development and update of comprehensive plan of care as evidenced by provider attestation and co-signature Successful outbound call placed to the patients daughter Elijah White to assess for care coordination needs Discussed the patient remains in rehab at [facility] with plans to transition to LTC once rehab days are complete  Determined the patient will begin seeing the in house provider Canceled future appointments with [facility], advised Elijah White to contact us if needed Collaboration with Elijah White and Barb Merino RN Care Manager to advise of patient plan to transition to in house provider at [facility]    Problem: Home and Family Safety (Wellness) Resolved 09/13/2020     Goal: Home and Family Safety  Maintained Completed 09/13/2020  Start Date: 08/03/2020  Expected End Date: 09/02/2020  Recent Progress: On track  Priority: High  Note:   Current Barriers:  Chronic disease management support and education needs related to DM, ESRD, and PAD   High fall risk Impaired Vision Ongoing wound and pain to great toe and heel, concern toe amputation may be needed  Social Worker Clinical Goal(s):  patient will work with SW to identify and address any acute and/or chronic care coordination needs related to the self health management of DM, ESRD, and PAD   Patient and his daughter will work with ED Social Worker to discuss care management needs post discharge including possibility of placement Completed- patient discharged home due to no rehab needs New 6.2.22- patient will work with his daughter to identify goals of care as instructed by SW New 6.3.22- Patient will initiate the use of a personal emergency response system once received by mail  SW Interventions:  Inter-disciplinary care team collaboration (see longitudinal plan of care) Collaboration with Elijah Chard, MD regarding development and update of comprehensive plan of care as evidenced by provider attestation  and co-signature Successful outbound call placed to the patients daughter, Elijah White, to assess patient progress in rehab following BKA Patient will remain at [facility] and transition to LTC once rehab days are complete       Follow Up Plan:  Case closure. Patient is encouraged to contact office or SW directly with acute needs . Collaboration with Elijah White to advise of patients plan to see Rober [facility]ion in house physician for primary care needs.      [name], BSW, CDP Social Worker, Certified Dementia Practitioner [place] [facility] [place] [facility] [phone]

## 2020-09-14 ENCOUNTER — Encounter: Payer: Medicare Other | Admitting: Internal Medicine

## 2020-09-14 DIAGNOSIS — N186 End stage renal disease: Secondary | ICD-10-CM | POA: Diagnosis not present

## 2020-09-14 DIAGNOSIS — D631 Anemia in chronic kidney disease: Secondary | ICD-10-CM | POA: Diagnosis not present

## 2020-09-14 DIAGNOSIS — D509 Iron deficiency anemia, unspecified: Secondary | ICD-10-CM | POA: Diagnosis not present

## 2020-09-14 DIAGNOSIS — E118 Type 2 diabetes mellitus with unspecified complications: Secondary | ICD-10-CM | POA: Diagnosis not present

## 2020-09-14 DIAGNOSIS — E876 Hypokalemia: Secondary | ICD-10-CM | POA: Diagnosis not present

## 2020-09-14 DIAGNOSIS — R52 Pain, unspecified: Secondary | ICD-10-CM | POA: Diagnosis not present

## 2020-09-14 DIAGNOSIS — Z992 Dependence on renal dialysis: Secondary | ICD-10-CM | POA: Diagnosis not present

## 2020-09-14 DIAGNOSIS — N2581 Secondary hyperparathyroidism of renal origin: Secondary | ICD-10-CM | POA: Diagnosis not present

## 2020-09-16 DIAGNOSIS — D509 Iron deficiency anemia, unspecified: Secondary | ICD-10-CM | POA: Diagnosis not present

## 2020-09-16 DIAGNOSIS — N186 End stage renal disease: Secondary | ICD-10-CM | POA: Diagnosis not present

## 2020-09-16 DIAGNOSIS — D631 Anemia in chronic kidney disease: Secondary | ICD-10-CM | POA: Diagnosis not present

## 2020-09-16 DIAGNOSIS — Z992 Dependence on renal dialysis: Secondary | ICD-10-CM | POA: Diagnosis not present

## 2020-09-16 DIAGNOSIS — N2581 Secondary hyperparathyroidism of renal origin: Secondary | ICD-10-CM | POA: Diagnosis not present

## 2020-09-16 DIAGNOSIS — E876 Hypokalemia: Secondary | ICD-10-CM | POA: Diagnosis not present

## 2020-09-16 DIAGNOSIS — R52 Pain, unspecified: Secondary | ICD-10-CM | POA: Diagnosis not present

## 2020-09-16 DIAGNOSIS — E118 Type 2 diabetes mellitus with unspecified complications: Secondary | ICD-10-CM | POA: Diagnosis not present

## 2020-09-19 DIAGNOSIS — N186 End stage renal disease: Secondary | ICD-10-CM | POA: Diagnosis not present

## 2020-09-19 DIAGNOSIS — D631 Anemia in chronic kidney disease: Secondary | ICD-10-CM | POA: Diagnosis not present

## 2020-09-19 DIAGNOSIS — E876 Hypokalemia: Secondary | ICD-10-CM | POA: Diagnosis not present

## 2020-09-19 DIAGNOSIS — N2581 Secondary hyperparathyroidism of renal origin: Secondary | ICD-10-CM | POA: Diagnosis not present

## 2020-09-19 DIAGNOSIS — Z992 Dependence on renal dialysis: Secondary | ICD-10-CM | POA: Diagnosis not present

## 2020-09-19 DIAGNOSIS — D509 Iron deficiency anemia, unspecified: Secondary | ICD-10-CM | POA: Diagnosis not present

## 2020-09-19 DIAGNOSIS — R52 Pain, unspecified: Secondary | ICD-10-CM | POA: Diagnosis not present

## 2020-09-19 DIAGNOSIS — E118 Type 2 diabetes mellitus with unspecified complications: Secondary | ICD-10-CM | POA: Diagnosis not present

## 2020-09-20 ENCOUNTER — Ambulatory Visit (INDEPENDENT_AMBULATORY_CARE_PROVIDER_SITE_OTHER): Payer: Medicare Other | Admitting: Physician Assistant

## 2020-09-20 ENCOUNTER — Other Ambulatory Visit: Payer: Self-pay

## 2020-09-20 ENCOUNTER — Telehealth: Payer: Self-pay | Admitting: Student

## 2020-09-20 VITALS — BP 147/71 | HR 66 | Temp 97.8°F | Resp 20 | Ht 65.0 in

## 2020-09-20 DIAGNOSIS — I739 Peripheral vascular disease, unspecified: Secondary | ICD-10-CM

## 2020-09-20 NOTE — Telephone Encounter (Signed)
Returned call to daughter [name] regarding Palliative Medicine follow up visit. Patient has improved to now receiving moderate assistance, instead of being totally dependent. He is still receiving therapy. Patient will stay at facility, [facility]. We discussed Palliative vs. Hospice services. Patient continues to receive HD for ESRD, but does have times where he gets tired. Continued support by Palliative Medicine. F/u visit scheduled for 10/11/20 at 9am.

## 2020-09-20 NOTE — Progress Notes (Signed)
POST OPERATIVE OFFICE NOTE    CC:  F/u for surgery  HPI:  This is a 80 y.o. 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 He had intractable left heel pain.  Arteriogram in May with intervention including stent and angioplasy of left SFA.  Compliant with asa, statin and Plavix.  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  No Known Allergies  Current Outpatient Medications  Medication Sig Dispense Refill   acetaminophen (TYLENOL) 500 MG tablet Take 1,000 mg by mouth every 6 (six) hours as needed for moderate pain or headache.     aspirin EC 81 MG tablet Take 81 mg by mouth in the morning. Swallow whole.     atorvastatin (LIPITOR) 10 MG tablet Take 1 tablet (10 mg total) by mouth in the morning.     AURYXIA 1 GM 210 MG(Fe) tablet Take 420 mg by mouth 3 (three) times daily with meals.     B Complex-C-Folic Acid (DIALYVITE 800) 0.8 MG TABS Take 1 tablet by mouth in the morning.     blood glucose meter kit and supplies KIT Dispense based on patient and insurance preference. Use up to four times daily as directed. (FOR ICD-9 250.00, 250.01). For QAC - HS accuchecks. 1 each 1   brimonidine (ALPHAGAN) 0.15 % ophthalmic solution Place 1 drop into the right eye 3 (three) times daily.     carvedilol (COREG) 3.125 MG tablet Take 1 tablet (3.125 mg total) by mouth 2 (two) times daily with a meal.     clopidogrel (PLAVIX) 75 MG tablet Take 1 tablet (75 mg total) by mouth daily with breakfast. 30 tablet 5   dorzolamide-timolol (COSOPT) 22.3-6.8 MG/ML ophthalmic solution Place 1 drop into the right eye 2 (two) times daily.  11   gabapentin (NEURONTIN) 100 MG capsule Take 1-2 capsules (100-200 mg total) by mouth See admin instructions. Take 2 capsules (200 mg) by mouth daily at bedtime on Tues, Thurs, and Sat. Take 1 capsule (100 mg) by mouth daily at bedtime daily on Mon, Wed, Fri, and Sun.     hydrocortisone cream 1 %  Apply 1 application topically 3 (three) times daily as needed for itching.     hydroxypropyl methylcellulose / hypromellose (ISOPTO TEARS / GONIOVISC) 2.5 % ophthalmic solution Place 1 drop into the left eye as needed for dry eyes.     isosorbide mononitrate (IMDUR) 30 MG 24 hr tablet Take 1 tablet (30 mg total) by mouth in the morning.     Lancets (ONETOUCH DELICA PLUS LANCET33G) MISC      ONETOUCH VERIO test strip USE AS DIRECTED TO TEST FOUR TIMES A DAY 100 each 3   oxyCODONE-acetaminophen (PERCOCET) 5-325 MG tablet Take 1 tablet by mouth every 6 (six) hours as needed (pain). 28 tablet 0   pantoprazole (PROTONIX) 40 MG tablet Take 1 tablet (40 mg total) by mouth daily at 12 noon.     pilocarpine (PILOCAR) 4 % ophthalmic solution Place 1 drop into the right eye 4 (four) times daily.      ROCKLATAN 0.02-0.005 % SOLN Place 1 drop into the right eye at bedtime.     No current facility-administered medications for this visit.     ROS:  See HPI  Vitals:   09/20/20 1034  BP: (!) 147/71  Pulse: 66  Resp: 20  Temp: 97.8 F (36.6 C)  SpO2: 99%     Physical Exam:    General appearance: Awake, alert in no apparent distress Cardiac: Heart rate and rhythm are regular Respirations: Nonlabored Extremities: Amputation site incision is well approximated without bleeding or hematoma.  Anterior and posterior flaps are warm and well-perfused. Excellent knee extension. Right foot warm with intact sensation and no skin breakdown.  Assessment/Plan:  This is a 80 y.o. male who is s/p:left BKA on 08/12/2020. Discontinue surgical staples.  The patient has a left Below Knee Amputation. The patient is well motivated to return to their prior functional status by utilizing a prosthesis to perform ADL's and maintain a healthy lifestyle. The patient has the physical and cognitive capacity to function with a prosthesis.   Functional Level: K2 Limited Community Ambulator: Has the ability or potential for  ambulation and to traverse low environmental barriers such as curbs, stairs or uneven surfaces  Residual Limb History: The skin condition of the residual limb is good. The patient will continue to monitor the skin of the residual limb and follow hygiene instructions.  The patient is experiencing no pain related to amputation  Prosthetic Prescription Plan: Counseling and education regarding prosthetic management will be provided to the patient via a certified prosthetist. A multi-discipline team, including physical therapy, will manage the prosthetic fabrication, fitting and prosthetic gait training.    Continue aspirin, Plavix and statin.  Follow-up in 6 months with ABIs and as needed.  Risa Grill, PA-C [facility] [phone]  Clinic MD:  Dr. Carlis Abbott

## 2020-09-21 ENCOUNTER — Other Ambulatory Visit: Payer: Self-pay

## 2020-09-21 DIAGNOSIS — D631 Anemia in chronic kidney disease: Secondary | ICD-10-CM | POA: Diagnosis not present

## 2020-09-21 DIAGNOSIS — D509 Iron deficiency anemia, unspecified: Secondary | ICD-10-CM | POA: Diagnosis not present

## 2020-09-21 DIAGNOSIS — N2581 Secondary hyperparathyroidism of renal origin: Secondary | ICD-10-CM | POA: Diagnosis not present

## 2020-09-21 DIAGNOSIS — R52 Pain, unspecified: Secondary | ICD-10-CM | POA: Diagnosis not present

## 2020-09-21 DIAGNOSIS — E118 Type 2 diabetes mellitus with unspecified complications: Secondary | ICD-10-CM | POA: Diagnosis not present

## 2020-09-21 DIAGNOSIS — I739 Peripheral vascular disease, unspecified: Secondary | ICD-10-CM

## 2020-09-21 DIAGNOSIS — N186 End stage renal disease: Secondary | ICD-10-CM | POA: Diagnosis not present

## 2020-09-21 DIAGNOSIS — Z992 Dependence on renal dialysis: Secondary | ICD-10-CM | POA: Diagnosis not present

## 2020-09-21 DIAGNOSIS — E876 Hypokalemia: Secondary | ICD-10-CM | POA: Diagnosis not present

## 2020-09-23 DIAGNOSIS — N2581 Secondary hyperparathyroidism of renal origin: Secondary | ICD-10-CM | POA: Diagnosis not present

## 2020-09-23 DIAGNOSIS — Z992 Dependence on renal dialysis: Secondary | ICD-10-CM | POA: Diagnosis not present

## 2020-09-23 DIAGNOSIS — E876 Hypokalemia: Secondary | ICD-10-CM | POA: Diagnosis not present

## 2020-09-23 DIAGNOSIS — N186 End stage renal disease: Secondary | ICD-10-CM | POA: Diagnosis not present

## 2020-09-23 DIAGNOSIS — R52 Pain, unspecified: Secondary | ICD-10-CM | POA: Diagnosis not present

## 2020-09-23 DIAGNOSIS — E118 Type 2 diabetes mellitus with unspecified complications: Secondary | ICD-10-CM | POA: Diagnosis not present

## 2020-09-23 DIAGNOSIS — D509 Iron deficiency anemia, unspecified: Secondary | ICD-10-CM | POA: Diagnosis not present

## 2020-09-23 DIAGNOSIS — D631 Anemia in chronic kidney disease: Secondary | ICD-10-CM | POA: Diagnosis not present

## 2020-09-26 ENCOUNTER — Encounter (HOSPITAL_COMMUNITY): Payer: Self-pay | Admitting: Emergency Medicine

## 2020-09-26 ENCOUNTER — Emergency Department (HOSPITAL_COMMUNITY)
Admission: EM | Admit: 2020-09-26 | Discharge: 2020-09-27 | Disposition: A | Discharge status: Home / Self Care | Payer: Medicare Other | Attending: Emergency Medicine | Admitting: Emergency Medicine

## 2020-09-26 ENCOUNTER — Other Ambulatory Visit: Payer: Self-pay

## 2020-09-26 DIAGNOSIS — M6281 Muscle weakness (generalized): Secondary | ICD-10-CM | POA: Diagnosis not present

## 2020-09-26 DIAGNOSIS — I5033 Acute on chronic diastolic (congestive) heart failure: Secondary | ICD-10-CM | POA: Insufficient documentation

## 2020-09-26 DIAGNOSIS — N186 End stage renal disease: Secondary | ICD-10-CM | POA: Insufficient documentation

## 2020-09-26 DIAGNOSIS — E1122 Type 2 diabetes mellitus with diabetic chronic kidney disease: Secondary | ICD-10-CM | POA: Insufficient documentation

## 2020-09-26 DIAGNOSIS — Z87891 Personal history of nicotine dependence: Secondary | ICD-10-CM | POA: Diagnosis not present

## 2020-09-26 DIAGNOSIS — Z7982 Long term (current) use of aspirin: Secondary | ICD-10-CM | POA: Diagnosis not present

## 2020-09-26 DIAGNOSIS — D631 Anemia in chronic kidney disease: Secondary | ICD-10-CM | POA: Diagnosis not present

## 2020-09-26 DIAGNOSIS — I132 Hypertensive heart and chronic kidney disease with heart failure and with stage 5 chronic kidney disease, or end stage renal disease: Secondary | ICD-10-CM | POA: Insufficient documentation

## 2020-09-26 DIAGNOSIS — E118 Type 2 diabetes mellitus with unspecified complications: Secondary | ICD-10-CM | POA: Diagnosis not present

## 2020-09-26 DIAGNOSIS — D509 Iron deficiency anemia, unspecified: Secondary | ICD-10-CM | POA: Diagnosis not present

## 2020-09-26 DIAGNOSIS — Z7902 Long term (current) use of antithrombotics/antiplatelets: Secondary | ICD-10-CM | POA: Insufficient documentation

## 2020-09-26 DIAGNOSIS — R52 Pain, unspecified: Secondary | ICD-10-CM | POA: Diagnosis not present

## 2020-09-26 DIAGNOSIS — Z20822 Contact with and (suspected) exposure to covid-19: Secondary | ICD-10-CM | POA: Diagnosis not present

## 2020-09-26 DIAGNOSIS — Z992 Dependence on renal dialysis: Secondary | ICD-10-CM | POA: Diagnosis not present

## 2020-09-26 DIAGNOSIS — N2581 Secondary hyperparathyroidism of renal origin: Secondary | ICD-10-CM | POA: Diagnosis not present

## 2020-09-26 DIAGNOSIS — R2681 Unsteadiness on feet: Secondary | ICD-10-CM | POA: Diagnosis not present

## 2020-09-26 DIAGNOSIS — R279 Unspecified lack of coordination: Secondary | ICD-10-CM | POA: Diagnosis not present

## 2020-09-26 DIAGNOSIS — Z79899 Other long term (current) drug therapy: Secondary | ICD-10-CM | POA: Insufficient documentation

## 2020-09-26 DIAGNOSIS — I10 Essential (primary) hypertension: Secondary | ICD-10-CM

## 2020-09-26 DIAGNOSIS — Z8546 Personal history of malignant neoplasm of prostate: Secondary | ICD-10-CM | POA: Insufficient documentation

## 2020-09-26 DIAGNOSIS — R6889 Other general symptoms and signs: Secondary | ICD-10-CM | POA: Diagnosis not present

## 2020-09-26 DIAGNOSIS — Z743 Need for continuous supervision: Secondary | ICD-10-CM | POA: Diagnosis not present

## 2020-09-26 DIAGNOSIS — I110 Hypertensive heart disease with heart failure: Secondary | ICD-10-CM | POA: Diagnosis not present

## 2020-09-26 DIAGNOSIS — Z4781 Encounter for orthopedic aftercare following surgical amputation: Secondary | ICD-10-CM | POA: Diagnosis not present

## 2020-09-26 DIAGNOSIS — E876 Hypokalemia: Secondary | ICD-10-CM

## 2020-09-26 DIAGNOSIS — R251 Tremor, unspecified: Secondary | ICD-10-CM | POA: Diagnosis not present

## 2020-09-26 LAB — COMPREHENSIVE METABOLIC PANEL
ALT: 9 U/L (ref 0–44)
AST: 16 U/L (ref 15–41)
Albumin: 3.3 g/dL — ABNORMAL LOW (ref 3.5–5.0)
Alkaline Phosphatase: 81 U/L (ref 38–126)
Anion gap: 12 (ref 5–15)
BUN: 7 mg/dL — ABNORMAL LOW (ref 8–23)
CO2: 31 mmol/L (ref 22–32)
Calcium: 10 mg/dL (ref 8.9–10.3)
Chloride: 96 mmol/L — ABNORMAL LOW (ref 98–111)
Creatinine, Ser: 4.15 mg/dL — ABNORMAL HIGH (ref 0.61–1.24)
GFR, Estimated: 14 mL/min — ABNORMAL LOW (ref >60)
Glucose, Bld: 127 mg/dL — ABNORMAL HIGH (ref 70–99)
Potassium: 2.9 mmol/L — ABNORMAL LOW (ref 3.5–5.1)
Sodium: 139 mmol/L (ref 135–145)
Total Bilirubin: 0.9 mg/dL (ref 0.3–1.2)
Total Protein: 6.2 g/dL — ABNORMAL LOW (ref 6.5–8.1)

## 2020-09-26 LAB — CBC WITH DIFFERENTIAL/PLATELET
Abs Immature Granulocytes: 0.01 10*3/uL (ref 0.00–0.07)
Basophils Absolute: 0 10*3/uL (ref 0.0–0.1)
Basophils Relative: 1 %
Eosinophils Absolute: 0.2 10*3/uL (ref 0.0–0.5)
Eosinophils Relative: 4 %
HCT: 36.5 % — ABNORMAL LOW (ref 39.0–52.0)
Hemoglobin: 11.7 g/dL — ABNORMAL LOW (ref 13.0–17.0)
Immature Granulocytes: 0 %
Lymphocytes Relative: 22 %
Lymphs Abs: 1.2 10*3/uL (ref 0.7–4.0)
MCH: 33.1 pg (ref 26.0–34.0)
MCHC: 32.1 g/dL (ref 30.0–36.0)
MCV: 103.4 fL — ABNORMAL HIGH (ref 80.0–100.0)
Monocytes Absolute: 0.8 10*3/uL (ref 0.1–1.0)
Monocytes Relative: 15 %
Neutro Abs: 3.1 10*3/uL (ref 1.7–7.7)
Neutrophils Relative %: 58 %
Platelets: 227 10*3/uL (ref 150–400)
RBC: 3.53 MIL/uL — ABNORMAL LOW (ref 4.22–5.81)
RDW: 15.1 % (ref 11.5–15.5)
WBC: 5.4 10*3/uL (ref 4.0–10.5)
nRBC: 0 % (ref 0.0–0.2)

## 2020-09-26 LAB — CBG MONITORING, ED: Glucose-Capillary: 101 mg/dL — ABNORMAL HIGH (ref 70–99)

## 2020-09-26 NOTE — ED Triage Notes (Signed)
Pt BIB [facility] from dialysis, c/o occasional tremors and intermittent headaches. Alert to baseline, denies chest pain/shortness of breath, abdominal pain/nausea/vomiting.

## 2020-09-26 NOTE — ED Notes (Signed)
Patient's daughter reports patient is feeling shaky, PMHx of diabetes. 8oz of apple juice provided and CBG obtained.

## 2020-09-26 NOTE — ED Provider Notes (Signed)
Emergency Medicine Provider Triage Evaluation Note  Elijah White , a 80 y.o. male  was evaluated in triage.  Pt complains of occasional tremors and intermittent headaches, is currently having a headache.  Patient was sent from dialysis, did not finish his dialysis today.  Denies any chest pain, shortness breath, nausea, vomiting, abdominal pain or pain elsewhere.  Review of Systems  Positive: Tremors, intermittent headaches Negative: Chest pain  Physical Exam  BP (!) 160/83 (BP Location: Right Arm)   Pulse 71   Temp 97.9 F (36.6 C) (Oral)   Resp 15   SpO2 100%  Gen:   Awake, no distress , no facial droop Resp:  Normal effort  MSK:   Moves extremities without difficulty    Medical Decision Making  Medically screening exam initiated at 5:18 PM.  Appropriate orders placed.  Elijah White was informed that the remainder of the evaluation will be completed by another provider, this initial triage assessment does not replace that evaluation, and the importance of remaining in the ED until their evaluation is complete.    Alfredia Client, PA-C 09/26/20 [place], [place], DO 09/26/20 2328

## 2020-09-27 ENCOUNTER — Telehealth: Payer: Medicare Other

## 2020-09-27 DIAGNOSIS — E876 Hypokalemia: Secondary | ICD-10-CM | POA: Diagnosis not present

## 2020-09-27 DIAGNOSIS — I70269 Atherosclerosis of native arteries of extremities with gangrene, unspecified extremity: Secondary | ICD-10-CM | POA: Diagnosis not present

## 2020-09-27 DIAGNOSIS — I739 Peripheral vascular disease, unspecified: Secondary | ICD-10-CM | POA: Diagnosis not present

## 2020-09-27 DIAGNOSIS — K219 Gastro-esophageal reflux disease without esophagitis: Secondary | ICD-10-CM | POA: Diagnosis not present

## 2020-09-27 DIAGNOSIS — R531 Weakness: Secondary | ICD-10-CM | POA: Diagnosis not present

## 2020-09-27 DIAGNOSIS — I5032 Chronic diastolic (congestive) heart failure: Secondary | ICD-10-CM | POA: Diagnosis not present

## 2020-09-27 DIAGNOSIS — N2581 Secondary hyperparathyroidism of renal origin: Secondary | ICD-10-CM | POA: Diagnosis not present

## 2020-09-27 DIAGNOSIS — M6281 Muscle weakness (generalized): Secondary | ICD-10-CM | POA: Diagnosis not present

## 2020-09-27 DIAGNOSIS — D631 Anemia in chronic kidney disease: Secondary | ICD-10-CM | POA: Diagnosis not present

## 2020-09-27 DIAGNOSIS — I129 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 kidney disease, or unspecified chronic kidney disease: Secondary | ICD-10-CM | POA: Diagnosis not present

## 2020-09-27 DIAGNOSIS — I120 Hypertensive chronic kidney disease with stage 5 chronic kidney disease or end stage renal disease: Secondary | ICD-10-CM | POA: Diagnosis not present

## 2020-09-27 DIAGNOSIS — D509 Iron deficiency anemia, unspecified: Secondary | ICD-10-CM | POA: Diagnosis not present

## 2020-09-27 DIAGNOSIS — R6889 Other general symptoms and signs: Secondary | ICD-10-CM | POA: Diagnosis not present

## 2020-09-27 DIAGNOSIS — E118 Type 2 diabetes mellitus with unspecified complications: Secondary | ICD-10-CM | POA: Diagnosis not present

## 2020-09-27 DIAGNOSIS — E1129 Type 2 diabetes mellitus with other diabetic kidney complication: Secondary | ICD-10-CM | POA: Diagnosis not present

## 2020-09-27 DIAGNOSIS — R279 Unspecified lack of coordination: Secondary | ICD-10-CM | POA: Diagnosis not present

## 2020-09-27 DIAGNOSIS — Z743 Need for continuous supervision: Secondary | ICD-10-CM | POA: Diagnosis not present

## 2020-09-27 DIAGNOSIS — Z992 Dependence on renal dialysis: Secondary | ICD-10-CM | POA: Diagnosis not present

## 2020-09-27 DIAGNOSIS — R2681 Unsteadiness on feet: Secondary | ICD-10-CM | POA: Diagnosis not present

## 2020-09-27 DIAGNOSIS — N186 End stage renal disease: Secondary | ICD-10-CM | POA: Diagnosis not present

## 2020-09-27 DIAGNOSIS — R52 Pain, unspecified: Secondary | ICD-10-CM | POA: Diagnosis not present

## 2020-09-27 DIAGNOSIS — Z4781 Encounter for orthopedic aftercare following surgical amputation: Secondary | ICD-10-CM | POA: Diagnosis not present

## 2020-09-27 DIAGNOSIS — Z89512 Acquired absence of left leg below knee: Secondary | ICD-10-CM | POA: Diagnosis not present

## 2020-09-27 LAB — SARS CORONAVIRUS 2 (TAT 6-24 HRS): SARS Coronavirus 2: NEGATIVE

## 2020-09-27 MED ORDER — ACETAMINOPHEN 325 MG PO TABS
650.0000 mg | ORAL_TABLET | Freq: Once | ORAL | Status: AC
  Administered 2020-09-27: 650 mg via ORAL
  Filled 2020-09-27: qty 2

## 2020-09-27 NOTE — ED Notes (Signed)
[facility] called three times with no answer from the nurse. Attempted report with no success. PT daughter, [name] called and updated by this RN regarding discharge. Daughter was asking why potassium was not replaced as it was 2.9. Dr.Steinl notified.

## 2020-09-27 NOTE — ED Notes (Signed)
[facility] notified by unit secretary to transport pt back to facility.

## 2020-09-27 NOTE — Discharge Instructions (Signed)
It was our pleasure to provide your ER care today - we hope that you feel better.  Follow up with primary care doctor and neurologist in the coming week.   From today's labs, your potassium level is low (2.9) - discussed with your doctors at next dialysis.   Return to ER if worse, new symptoms, numbness/weakness, fevers, new/severe pain, seizures, trouble breathing, or other concern.

## 2020-09-27 NOTE — ED Provider Notes (Signed)
[facility] EMERGENCY DEPARTMENT Provider Note   CSN: [id number] Arrival date & time: 09/26/20  1452     History Chief Complaint  Patient presents with   Tremors    Elijah White is a 80 y.o. male.  Patient reports feeling tremulous/shaky after HD yesterday. Notes hx similar, milder symptoms in past but was especially pronounced after last dialysis. Symptoms acute onset, moderate, intermittent, lasting seconds at a time.  Pt indicates he feels symptoms began after prior left leg amputation - pt denies problems w stump, no increased redness or swelling, no drainage or ulceration. Denies hx Parkinsons or other neurologic disease. Denies change in meds or change in dialysis. Has been eating/drinking. No fever/chills/sweats. No headache. No chest pain or palpitations. No sob. No abd pain or nvd.   The history is provided by the patient and medical records.      Past Medical History:  Diagnosis Date   Anemia    low iron   Aortic stenosis    s/p TAVR 01/19/20   Arthritis    Cancer [facility])    prostate, s/p I-125 seed implant 05/18/05   Colon polyps ~ 1993 and 2003   Dr [name], [facility].  08/2001 colonoscopy: tubular adenoma at cecum.     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, 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[place]) 08/12/2020   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   Acute on chronic diastolic heart failure ([place]) 01/19/2020   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   RBBB    Near syncope 12/13/2019   Severe aortic stenosis    ESRD (end stage  renal disease) ([place]) 11/26/2019   Macrocytic anemia 11/26/2019   Adrenal mass ([place]) 11/26/2019   Colonic mass 11/26/2019   ARF (acute renal failure) ([place]) 03/25/2018   Vision loss of right eye 11/11/2017   Bilateral pseudophakia 07/11/2017   GERD (gastroesophageal reflux disease) 09/10/2016   HLD (hyperlipidemia) 09/10/2016   Essential hypertension 08/13/2016   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   Type 2 diabetes mellitus with renal manifestations ([place]) 10/22/2006   BPH (benign prostatic hyperplasia) 10/22/2006   COLONIC POLYPS, HX OF 10/22/2006    Past Surgical History:  Procedure Laterality Date   ABDOMINAL AORTOGRAM W/LOWER EXTREMITY N/A 07/25/2020   Procedure: ABDOMINAL AORTOGRAM W/LOWER EXTREMITY;  Surgeon: Marty Heck, MD;  Location: [place] CV LAB;  Service: Cardiovascular;  Laterality: N/A;   AMPUTATION Left 08/12/2020   Procedure: LEFT BELOW KNEE AMPUTATION;  Surgeon: Marty Heck, MD;  Location: [facility] OR;  Service: Vascular;  Laterality: Left;   AV FISTULA PLACEMENT Left 03/20/2018   Procedure: ARTERIOVENOUS (AV) FISTULA CREATION ARM;  Surgeon: [name], MD;  Location: [place];  Service: Vascular;  Laterality: Left;   [employer] Left 05/21/2018   Procedure: LEFT BASILIC VEIN FISTULA SECOND STAGE;  Surgeon: [name], MD;  Location: [place];  Service: Vascular;  Laterality: Left;   COLONOSCOPY     COLONOSCOPY WITH PROPOFOL  N/A 11/30/2019   Procedure: COLONOSCOPY WITH PROPOFOL;  Surgeon: Ronnette Juniper, MD;  Location: [place];  Service: Gastroenterology;  Laterality: N/A;   GLAUCOMA SURGERY  2019   multiple surgeries   HEMOSTASIS CLIP PLACEMENT  11/30/2019   Procedure: HEMOSTASIS CLIP PLACEMENT;  Surgeon: Ronnette Juniper, MD;  Location: [facility];  Service: Gastroenterology;;   KNEE ARTHROSCOPY     MULTIPLE EXTRACTIONS WITH ALVEOLOPLASTY N/A 12/30/2019   Procedure: MULTIPLE EXTRACTION WITH ALVEOLOPLASTY;   Surgeon: Charlaine Dalton, DMD;  Location: [place];  Service: Dentistry;  Laterality: N/A;   PERIPHERAL VASCULAR INTERVENTION Left 07/25/2020   Procedure: PERIPHERAL VASCULAR INTERVENTION;  Surgeon: Marty Heck, MD;  Location: [place] CV LAB;  Service: Cardiovascular;  Laterality: Left;  superficial femoral   POLYPECTOMY  11/30/2019   Procedure: POLYPECTOMY;  Surgeon: Ronnette Juniper, MD;  Location: [place];  Service: Gastroenterology;;   PROSTATE SURGERY     RIGHT/LEFT HEART CATH AND CORONARY ANGIOGRAPHY N/A 12/03/2019   Procedure: RIGHT/LEFT HEART CATH AND CORONARY ANGIOGRAPHY;  Surgeon: Burnell Blanks, MD;  Location: [place] CV LAB;  Service: Cardiovascular;  Laterality: N/A;   SUBMUCOSAL TATTOO INJECTION  11/30/2019   Procedure: SUBMUCOSAL TATTOO INJECTION;  Surgeon: Ronnette Juniper, MD;  Location: [place];  Service: Gastroenterology;;   TEE WITHOUT CARDIOVERSION N/A 01/19/2020   Procedure: TRANSESOPHAGEAL ECHOCARDIOGRAM (TEE);  Surgeon: Burnell Blanks, MD;  Location: [place] [facility] LAB;  Service: Open Heart Surgery;  Laterality: N/A;   TRANSCATHETER AORTIC VALVE REPLACEMENT, TRANSFEMORAL Left 01/19/2020   Procedure: TRANSCATHETER AORTIC VALVE REPLACEMENT, LEFT TRANSFEMORAL;  Surgeon: Burnell Blanks, MD;  Location: [place] [facility] LAB;  Service: Open Heart Surgery;  Laterality: Left;       Family History  Problem Relation Age of Onset   CAD Mother     Social History   Tobacco Use   Smoking status: Former    Packs/day: 0.50    Types: Cigarettes    Quit date: 03/23/2018    Years since quitting: 2.5   Smokeless tobacco: Never  Vaping Use   Vaping Use: Never used  Substance Use Topics   Alcohol use: No   Drug use: No    Home Medications Prior to Admission medications   Medication Sig Start Date End Date Taking? Authorizing Provider  acetaminophen (TYLENOL) 500 MG tablet Take 1,000 mg by mouth every 6 (six) hours as needed for moderate  pain or headache.    [provider]  aspirin EC 81 MG tablet Take 81 mg by mouth in the morning. Swallow whole.    [provider]  atorvastatin (LIPITOR) 10 MG tablet Take 1 tablet (10 mg total) by mouth in the morning. 08/16/20   Rhyne, Hulen Shouts, PA-C  AURYXIA 1 GM 210 MG(Fe) tablet Take 420 mg by mouth 3 (three) times daily with meals. 05/30/20   [provider]  B Complex-C-Folic Acid (DIALYVITE 967) 0.8 MG TABS Take 1 tablet by mouth in the morning. 05/08/19   [provider]  blood glucose meter kit and supplies KIT Dispense based on patient and insurance preference. Use up to four times daily as directed. (FOR ICD-9 250.00, 250.01). For QAC - HS accuchecks. 03/30/18   Thurnell Lose, MD  brimonidine (ALPHAGAN) 0.15 % ophthalmic solution Place 1 drop into the right eye 3 (three) times daily.    [provider]  carvedilol (COREG) 3.125 MG tablet Take 1 tablet (3.125 mg total) by mouth 2 (two) times daily with a meal.  08/16/20   Rhyne, Ames Coupe, PA-C  clopidogrel (PLAVIX) 75 MG tablet Take 1 tablet (75 mg total) by mouth daily with breakfast. 07/25/20   Cephus Shelling, MD  dorzolamide-timolol (COSOPT) 22.3-6.8 MG/ML ophthalmic solution Place 1 drop into the right eye 2 (two) times daily. 06/25/17   [provider]  gabapentin (NEURONTIN) 100 MG capsule Take 1-2 capsules (100-200 mg total) by mouth See admin instructions. Take 2 capsules (200 mg) by mouth daily at bedtime on Tues, Thurs, and Sat. Take 1 capsule (100 mg) by mouth daily at bedtime daily on Mon, Wed, Fri, and Sun. 08/16/20   Rhyne, Ames Coupe, PA-C  hydrocortisone cream 1 % Apply 1 application topically 3 (three) times daily as needed for itching.    [provider]  hydroxypropyl methylcellulose / hypromellose (ISOPTO TEARS / GONIOVISC) 2.5 % ophthalmic solution Place 1 drop into the left eye as needed for dry eyes.    [provider]  isosorbide mononitrate  (IMDUR) 30 MG 24 hr tablet Take 1 tablet (30 mg total) by mouth in the morning. 08/16/20   Rhyne, Ames Coupe, PA-C  Lancets (ONETOUCH DELICA PLUS [place]) MISC  03/31/18   [provider]  [facility] VERIO test strip USE AS DIRECTED TO TEST FOUR TIMES A DAY 07/31/18   Dorothyann Peng, MD  oxyCODONE-acetaminophen (PERCOCET) 5-325 MG tablet Take 1 tablet by mouth every 6 (six) hours as needed (pain). 08/19/20   Baglia, Corrina, PA-C  pantoprazole (PROTONIX) 40 MG tablet Take 1 tablet (40 mg total) by mouth daily at 12 noon. 08/16/20   Rhyne, Ames Coupe, PA-C  pilocarpine (PILOCAR) 4 % ophthalmic solution Place 1 drop into the right eye 4 (four) times daily.     [provider]  ROCKLATAN 0.02-0.005 % SOLN Place 1 drop into the right eye at bedtime. 06/20/20   [provider]    Allergies    Patient has no known allergies.  Review of Systems   Review of Systems  Constitutional:  Negative for chills and fever.  HENT:  Negative for sore throat.   Eyes:  Negative for redness.  Respiratory:  Negative for cough and shortness of breath.   Cardiovascular:  Negative for chest pain, palpitations and leg swelling.  Gastrointestinal:  Negative for abdominal pain, diarrhea and vomiting.  Genitourinary:  Negative for flank pain.  Musculoskeletal:  Negative for back pain and neck pain.  Skin:  Negative for rash.  Neurological:  Negative for weakness, numbness and headaches.  Hematological:  Does not bruise/bleed easily.  Psychiatric/Behavioral:  Negative for confusion.    Physical Exam Updated Vital Signs BP (!) 176/81 (BP Location: Right Arm)   Pulse 73   Temp 97.9 F (36.6 C) (Oral)   Resp 17   SpO2 92%   Physical Exam Vitals and nursing note reviewed.  Constitutional:      Appearance: Normal appearance. He is well-developed.  HENT:     Head: Atraumatic.     Nose: Nose normal.     Mouth/Throat:     Mouth: Mucous membranes are moist.     Pharynx: Oropharynx is clear.   Eyes:     General: No scleral icterus.    Conjunctiva/sclera: Conjunctivae normal.  Neck:     Vascular: No carotid bruit.     Trachea: No tracheal deviation.     Comments: No stiffness or rigidity Cardiovascular:     Rate and Rhythm: Normal rate and regular rhythm.     Pulses: Normal pulses.  Heart sounds: Normal heart sounds. No murmur heard.   No friction rub. No gallop.  Pulmonary:     Effort: Pulmonary effort is normal. No accessory muscle usage or respiratory distress.     Breath sounds: Normal breath sounds.  Abdominal:     General: Bowel sounds are normal. There is no distension.     Palpations: Abdomen is soft.     Tenderness: There is no abdominal tenderness. There is no guarding.  Genitourinary:    Comments: No cva tenderness. Musculoskeletal:        General: No swelling or tenderness.     Cervical back: Normal range of motion and neck supple. No rigidity.     Right lower leg: No edema.     Comments: LLE amputation, stump intact with no sign of infection. Left upper arm dialysis fistula with palp thrill. Non tender, no infection to site.   Skin:    General: Skin is warm and dry.     Findings: No rash.  Neurological:     Mental Status: He is alert.     Comments: Alert, speech clear. No dysarthria or aphasia. Motor/sens grossly intact bil. No tremor or shakes noted.   Psychiatric:        Mood and Affect: Mood normal.    ED Results / Procedures / Treatments   Labs (all labs ordered are listed, but only abnormal results are displayed) Results for orders placed or performed during the hospital encounter of 09/26/20  SARS CORONAVIRUS 2 (TAT 6-24 HRS) Nasopharyngeal Nasopharyngeal Swab   Specimen: Nasopharyngeal Swab  Result Value Ref Range   SARS Coronavirus 2 NEGATIVE NEGATIVE  Comprehensive metabolic panel  Result Value Ref Range   Sodium 139 135 - 145 mmol/L   Potassium 2.9 (L) 3.5 - 5.1 mmol/L   Chloride 96 (L) 98 - 111 mmol/L   CO2 31 22 - 32 mmol/L    Glucose, Bld 127 (H) 70 - 99 mg/dL   BUN 7 (L) 8 - 23 mg/dL   Creatinine, Ser 4.15 (H) 0.61 - 1.24 mg/dL   Calcium 10.0 8.9 - 10.3 mg/dL   Total Protein 6.2 (L) 6.5 - 8.1 g/dL   Albumin 3.3 (L) 3.5 - 5.0 g/dL   AST 16 15 - 41 U/L   ALT 9 0 - 44 U/L   Alkaline Phosphatase 81 38 - 126 U/L   Total Bilirubin 0.9 0.3 - 1.2 mg/dL   GFR, Estimated 14 (L) >60 mL/min   Anion gap 12 5 - 15  CBC with Differential  Result Value Ref Range   WBC 5.4 4.0 - 10.5 K/uL   RBC 3.53 (L) 4.22 - 5.81 MIL/uL   Hemoglobin 11.7 (L) 13.0 - 17.0 g/dL   HCT 36.5 (L) 39.0 - 52.0 %   MCV 103.4 (H) 80.0 - 100.0 fL   MCH 33.1 26.0 - 34.0 pg   MCHC 32.1 30.0 - 36.0 g/dL   RDW 15.1 11.5 - 15.5 %   Platelets 227 150 - 400 K/uL   nRBC 0.0 0.0 - 0.2 %   Neutrophils Relative % 58 %   Neutro Abs 3.1 1.7 - 7.7 K/uL   Lymphocytes Relative 22 %   Lymphs Abs 1.2 0.7 - 4.0 K/uL   Monocytes Relative 15 %   Monocytes Absolute 0.8 0.1 - 1.0 K/uL   Eosinophils Relative 4 %   Eosinophils Absolute 0.2 0.0 - 0.5 K/uL   Basophils Relative 1 %   Basophils Absolute 0.0 0.0 - 0.1 K/uL  Immature Granulocytes 0 %   Abs Immature Granulocytes 0.01 0.00 - 0.07 K/uL  CBG monitoring, ED  Result Value Ref Range   Glucose-Capillary 101 (H) 70 - 99 mg/dL   Comment 1 Notify RN    Comment 2 Document in Chart      EKG None  Radiology No results found.  Procedures Procedures   Medications Ordered in ED Medications  acetaminophen (TYLENOL) tablet 650 mg (650 mg Oral Given 09/27/20 0254)    ED Course  I have reviewed the triage vital signs and the nursing notes.  Pertinent labs & imaging results that were available during my care of the patient were reviewed by me and considered in my medical decision making (see chart for details).    MDM Rules/Calculators/A&P                           Labs sent.   Reviewed nursing notes and prior charts for additional history.   Po fluids/food.   Labs reviewed/interpreted by me  - wbc normal, k 2.9, low (but dialysis pt, so will not give rx k now).  Glucose normal.  Recheck pt, no tremor or shakes noted.   Pt currently is comfortable and without current symptoms. As such, pt appears stable for d/c. Rec outpt pcp/neurology f/u.    Final Clinical Impression(s) / ED Diagnoses Final diagnoses:  None    Rx / DC Orders ED Discharge Orders     None        Lajean Saver, MD 09/27/20 1007

## 2020-09-27 NOTE — Progress Notes (Signed)
[place] [id number]) Hospital Liaison note:  This patient is currently enrolled in [facility] outpatient-based Palliative Care. Will continue to follow for disposition.  Please call with any outpatient palliative questions or concerns.  Thank you, Lorelee Market, LPN [facility] Liaison [phone]

## 2020-09-28 DIAGNOSIS — N2581 Secondary hyperparathyroidism of renal origin: Secondary | ICD-10-CM | POA: Diagnosis not present

## 2020-09-28 DIAGNOSIS — Z992 Dependence on renal dialysis: Secondary | ICD-10-CM | POA: Diagnosis not present

## 2020-09-28 DIAGNOSIS — D509 Iron deficiency anemia, unspecified: Secondary | ICD-10-CM | POA: Diagnosis not present

## 2020-09-28 DIAGNOSIS — D631 Anemia in chronic kidney disease: Secondary | ICD-10-CM | POA: Diagnosis not present

## 2020-09-28 DIAGNOSIS — E876 Hypokalemia: Secondary | ICD-10-CM | POA: Insufficient documentation

## 2020-09-28 DIAGNOSIS — E118 Type 2 diabetes mellitus with unspecified complications: Secondary | ICD-10-CM | POA: Diagnosis not present

## 2020-09-28 DIAGNOSIS — R52 Pain, unspecified: Secondary | ICD-10-CM | POA: Diagnosis not present

## 2020-09-28 DIAGNOSIS — N186 End stage renal disease: Secondary | ICD-10-CM | POA: Diagnosis not present

## 2020-09-29 ENCOUNTER — Telehealth: Payer: Self-pay

## 2020-09-29 NOTE — Telephone Encounter (Signed)
Pts daughter called for TCM call to sch an ED follow up. Daughter stated he now attends an long term care facility.

## 2020-09-30 DIAGNOSIS — R52 Pain, unspecified: Secondary | ICD-10-CM | POA: Diagnosis not present

## 2020-09-30 DIAGNOSIS — N186 End stage renal disease: Secondary | ICD-10-CM | POA: Diagnosis not present

## 2020-09-30 DIAGNOSIS — N2581 Secondary hyperparathyroidism of renal origin: Secondary | ICD-10-CM | POA: Diagnosis not present

## 2020-09-30 DIAGNOSIS — Z992 Dependence on renal dialysis: Secondary | ICD-10-CM | POA: Diagnosis not present

## 2020-09-30 DIAGNOSIS — D509 Iron deficiency anemia, unspecified: Secondary | ICD-10-CM | POA: Diagnosis not present

## 2020-09-30 DIAGNOSIS — E876 Hypokalemia: Secondary | ICD-10-CM | POA: Diagnosis not present

## 2020-09-30 DIAGNOSIS — E118 Type 2 diabetes mellitus with unspecified complications: Secondary | ICD-10-CM | POA: Diagnosis not present

## 2020-09-30 DIAGNOSIS — D631 Anemia in chronic kidney disease: Secondary | ICD-10-CM | POA: Diagnosis not present

## 2020-10-02 DIAGNOSIS — I129 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 kidney disease, or unspecified chronic kidney disease: Secondary | ICD-10-CM | POA: Diagnosis not present

## 2020-10-02 DIAGNOSIS — N186 End stage renal disease: Secondary | ICD-10-CM | POA: Diagnosis not present

## 2020-10-02 DIAGNOSIS — Z992 Dependence on renal dialysis: Secondary | ICD-10-CM | POA: Diagnosis not present

## 2020-10-03 DIAGNOSIS — R2681 Unsteadiness on feet: Secondary | ICD-10-CM | POA: Diagnosis not present

## 2020-10-03 DIAGNOSIS — N186 End stage renal disease: Secondary | ICD-10-CM | POA: Diagnosis not present

## 2020-10-03 DIAGNOSIS — Z992 Dependence on renal dialysis: Secondary | ICD-10-CM | POA: Diagnosis not present

## 2020-10-03 DIAGNOSIS — R1312 Dysphagia, oropharyngeal phase: Secondary | ICD-10-CM | POA: Diagnosis not present

## 2020-10-03 DIAGNOSIS — D631 Anemia in chronic kidney disease: Secondary | ICD-10-CM | POA: Diagnosis not present

## 2020-10-03 DIAGNOSIS — M6281 Muscle weakness (generalized): Secondary | ICD-10-CM | POA: Diagnosis not present

## 2020-10-03 DIAGNOSIS — D509 Iron deficiency anemia, unspecified: Secondary | ICD-10-CM | POA: Diagnosis not present

## 2020-10-03 DIAGNOSIS — Z4781 Encounter for orthopedic aftercare following surgical amputation: Secondary | ICD-10-CM | POA: Diagnosis not present

## 2020-10-03 DIAGNOSIS — R279 Unspecified lack of coordination: Secondary | ICD-10-CM | POA: Diagnosis not present

## 2020-10-03 DIAGNOSIS — N2581 Secondary hyperparathyroidism of renal origin: Secondary | ICD-10-CM | POA: Diagnosis not present

## 2020-10-04 ENCOUNTER — Ambulatory Visit (INDEPENDENT_AMBULATORY_CARE_PROVIDER_SITE_OTHER): Payer: Medicare Other | Admitting: Podiatry

## 2020-10-04 ENCOUNTER — Other Ambulatory Visit: Payer: Self-pay

## 2020-10-04 ENCOUNTER — Encounter: Payer: Self-pay | Admitting: Podiatry

## 2020-10-04 DIAGNOSIS — Z89512 Acquired absence of left leg below knee: Secondary | ICD-10-CM | POA: Diagnosis not present

## 2020-10-04 DIAGNOSIS — E1151 Type 2 diabetes mellitus with diabetic peripheral angiopathy without gangrene: Secondary | ICD-10-CM

## 2020-10-04 DIAGNOSIS — L89610 Pressure ulcer of right heel, unstageable: Secondary | ICD-10-CM

## 2020-10-04 DIAGNOSIS — M79674 Pain in right toe(s): Secondary | ICD-10-CM | POA: Diagnosis not present

## 2020-10-04 DIAGNOSIS — M6281 Muscle weakness (generalized): Secondary | ICD-10-CM | POA: Diagnosis not present

## 2020-10-04 DIAGNOSIS — I70269 Atherosclerosis of native arteries of extremities with gangrene, unspecified extremity: Secondary | ICD-10-CM | POA: Diagnosis not present

## 2020-10-04 DIAGNOSIS — R1312 Dysphagia, oropharyngeal phase: Secondary | ICD-10-CM | POA: Diagnosis not present

## 2020-10-04 DIAGNOSIS — R251 Tremor, unspecified: Secondary | ICD-10-CM | POA: Diagnosis not present

## 2020-10-04 DIAGNOSIS — M79675 Pain in left toe(s): Secondary | ICD-10-CM | POA: Diagnosis not present

## 2020-10-04 DIAGNOSIS — B351 Tinea unguium: Secondary | ICD-10-CM | POA: Diagnosis not present

## 2020-10-04 DIAGNOSIS — L853 Xerosis cutis: Secondary | ICD-10-CM

## 2020-10-04 DIAGNOSIS — R279 Unspecified lack of coordination: Secondary | ICD-10-CM | POA: Diagnosis not present

## 2020-10-04 DIAGNOSIS — R2681 Unsteadiness on feet: Secondary | ICD-10-CM | POA: Diagnosis not present

## 2020-10-04 DIAGNOSIS — H548 Legal blindness, as defined in USA: Secondary | ICD-10-CM | POA: Diagnosis not present

## 2020-10-04 DIAGNOSIS — Z4781 Encounter for orthopedic aftercare following surgical amputation: Secondary | ICD-10-CM | POA: Diagnosis not present

## 2020-10-04 NOTE — Patient Instructions (Signed)
To [facility] Staff:  Apply Aquaphor Ointment to right foot once daily for dry skin.  Patient to wear heel protector at all times when in bed.

## 2020-10-05 DIAGNOSIS — R279 Unspecified lack of coordination: Secondary | ICD-10-CM | POA: Diagnosis not present

## 2020-10-05 DIAGNOSIS — R1312 Dysphagia, oropharyngeal phase: Secondary | ICD-10-CM | POA: Diagnosis not present

## 2020-10-05 DIAGNOSIS — Z4781 Encounter for orthopedic aftercare following surgical amputation: Secondary | ICD-10-CM | POA: Diagnosis not present

## 2020-10-05 DIAGNOSIS — M6281 Muscle weakness (generalized): Secondary | ICD-10-CM | POA: Diagnosis not present

## 2020-10-05 DIAGNOSIS — R2681 Unsteadiness on feet: Secondary | ICD-10-CM | POA: Diagnosis not present

## 2020-10-05 DIAGNOSIS — N186 End stage renal disease: Secondary | ICD-10-CM | POA: Diagnosis not present

## 2020-10-05 DIAGNOSIS — D509 Iron deficiency anemia, unspecified: Secondary | ICD-10-CM | POA: Diagnosis not present

## 2020-10-05 DIAGNOSIS — D631 Anemia in chronic kidney disease: Secondary | ICD-10-CM | POA: Diagnosis not present

## 2020-10-05 DIAGNOSIS — Z992 Dependence on renal dialysis: Secondary | ICD-10-CM | POA: Diagnosis not present

## 2020-10-05 DIAGNOSIS — N2581 Secondary hyperparathyroidism of renal origin: Secondary | ICD-10-CM | POA: Diagnosis not present

## 2020-10-06 DIAGNOSIS — R2681 Unsteadiness on feet: Secondary | ICD-10-CM | POA: Diagnosis not present

## 2020-10-06 DIAGNOSIS — M6281 Muscle weakness (generalized): Secondary | ICD-10-CM | POA: Diagnosis not present

## 2020-10-06 DIAGNOSIS — R279 Unspecified lack of coordination: Secondary | ICD-10-CM | POA: Diagnosis not present

## 2020-10-06 DIAGNOSIS — Z4781 Encounter for orthopedic aftercare following surgical amputation: Secondary | ICD-10-CM | POA: Diagnosis not present

## 2020-10-06 DIAGNOSIS — R1312 Dysphagia, oropharyngeal phase: Secondary | ICD-10-CM | POA: Diagnosis not present

## 2020-10-07 DIAGNOSIS — R2681 Unsteadiness on feet: Secondary | ICD-10-CM | POA: Diagnosis not present

## 2020-10-07 DIAGNOSIS — D509 Iron deficiency anemia, unspecified: Secondary | ICD-10-CM | POA: Diagnosis not present

## 2020-10-07 DIAGNOSIS — Z992 Dependence on renal dialysis: Secondary | ICD-10-CM | POA: Diagnosis not present

## 2020-10-07 DIAGNOSIS — M6281 Muscle weakness (generalized): Secondary | ICD-10-CM | POA: Diagnosis not present

## 2020-10-07 DIAGNOSIS — N186 End stage renal disease: Secondary | ICD-10-CM | POA: Diagnosis not present

## 2020-10-07 DIAGNOSIS — N2581 Secondary hyperparathyroidism of renal origin: Secondary | ICD-10-CM | POA: Diagnosis not present

## 2020-10-07 DIAGNOSIS — D631 Anemia in chronic kidney disease: Secondary | ICD-10-CM | POA: Diagnosis not present

## 2020-10-07 DIAGNOSIS — Z4781 Encounter for orthopedic aftercare following surgical amputation: Secondary | ICD-10-CM | POA: Diagnosis not present

## 2020-10-07 DIAGNOSIS — R279 Unspecified lack of coordination: Secondary | ICD-10-CM | POA: Diagnosis not present

## 2020-10-07 DIAGNOSIS — R1312 Dysphagia, oropharyngeal phase: Secondary | ICD-10-CM | POA: Diagnosis not present

## 2020-10-08 ENCOUNTER — Encounter: Payer: Self-pay | Admitting: Podiatry

## 2020-10-08 DIAGNOSIS — M6281 Muscle weakness (generalized): Secondary | ICD-10-CM | POA: Diagnosis not present

## 2020-10-08 DIAGNOSIS — R2681 Unsteadiness on feet: Secondary | ICD-10-CM | POA: Diagnosis not present

## 2020-10-08 DIAGNOSIS — Z4781 Encounter for orthopedic aftercare following surgical amputation: Secondary | ICD-10-CM | POA: Diagnosis not present

## 2020-10-08 DIAGNOSIS — R1312 Dysphagia, oropharyngeal phase: Secondary | ICD-10-CM | POA: Diagnosis not present

## 2020-10-08 DIAGNOSIS — R279 Unspecified lack of coordination: Secondary | ICD-10-CM | POA: Diagnosis not present

## 2020-10-08 NOTE — Progress Notes (Signed)
Subjective: Elijah White is a pleasant 80 y.o. male patient seen today for at risk foot care. Patient has h/o NIDDM with ESRD on hemodialysis. He had BKA of LLE performed since his last visit due to PAD.   He is accompanied by his daughter on today's visit. Elijah White now resides at [facility].  He has been treated for decubitus ulcer of right heel and should be wearing heel protector of RLE per daughter.   No Known Allergies  Objective: Physical Exam  General: Elijah White is a pleasant 80 y.o. African American male, in NAD. AAO x 3.   Vascular:  Capillary fill time to digits delayed 1-5 right. Faintly palpable DP pulse(s) right lower extremity. Faintly palpable PT pulse(s) right lower extremity. Pedal hair absent. Lower extremity skin temperature gradient within normal limits. Dependent rubor noted right lower extremity. No pain with calf compression b/l. Nonpitting edema noted right lower extremity. No ischemia or gangrene noted right foot.  Dermatological:  No open wounds right lower extremity. No interdigital macerations right lower extremity. Toenails 1-5 right elongated, discolored, dystrophic, thickened, and crumbly with subungual debris and tenderness to dorsal palpation. Unstageable heel decubitus ulceration on plantarlateral aspect of right heel. No erythema, no edema, no drainage, no flucutance.  Musculoskeletal:  Noted disuse atrophy right lower extremity. Lower extremity amputation(s): below knee amputation left lower extremity. Utilizes wheelchair for mobility assistance.  Neurological:  Protective sensation diminished with 10 gram monofilament right lower extremity.  Assessment and Plan:  1. Pain due to onychomycosis of toenails of both feet   2. Pressure injury of right heel, unstageable ([name])   3. Xerosis cutis   4. Status post below knee amputation, left (Elijah White)   5. Type II diabetes mellitus with peripheral circulatory disorder [name])      -Examined patient. -Orders written for [facility] Re[facility] staff to apply Aquaphor Ointment to right foot once daily for dry skin. Patient to also wear heel protector at all times when in bed. Discussed plan with daughter.. -Patient to continue soft, supportive shoe gear daily. -Toenails 1-5 b/l were debrided in length and girth with sterile nail nippers and dremel without iatrogenic bleeding.  -Patient to report any pedal injuries to medical professional immediately. -Patient/POA to call should there be question/concern in the interim.  Return in about 3 months (around 01/04/2021).  Elijah White, DPM

## 2020-10-10 DIAGNOSIS — N186 End stage renal disease: Secondary | ICD-10-CM | POA: Diagnosis not present

## 2020-10-10 DIAGNOSIS — D631 Anemia in chronic kidney disease: Secondary | ICD-10-CM | POA: Diagnosis not present

## 2020-10-10 DIAGNOSIS — M6281 Muscle weakness (generalized): Secondary | ICD-10-CM | POA: Diagnosis not present

## 2020-10-10 DIAGNOSIS — Z4781 Encounter for orthopedic aftercare following surgical amputation: Secondary | ICD-10-CM | POA: Diagnosis not present

## 2020-10-10 DIAGNOSIS — R1312 Dysphagia, oropharyngeal phase: Secondary | ICD-10-CM | POA: Diagnosis not present

## 2020-10-10 DIAGNOSIS — R2681 Unsteadiness on feet: Secondary | ICD-10-CM | POA: Diagnosis not present

## 2020-10-10 DIAGNOSIS — Z992 Dependence on renal dialysis: Secondary | ICD-10-CM | POA: Diagnosis not present

## 2020-10-10 DIAGNOSIS — R279 Unspecified lack of coordination: Secondary | ICD-10-CM | POA: Diagnosis not present

## 2020-10-10 DIAGNOSIS — D509 Iron deficiency anemia, unspecified: Secondary | ICD-10-CM | POA: Diagnosis not present

## 2020-10-10 DIAGNOSIS — N2581 Secondary hyperparathyroidism of renal origin: Secondary | ICD-10-CM | POA: Diagnosis not present

## 2020-10-11 ENCOUNTER — Other Ambulatory Visit: Payer: Self-pay

## 2020-10-11 ENCOUNTER — Non-Acute Institutional Stay: Payer: Medicare Other | Admitting: Student

## 2020-10-11 DIAGNOSIS — Z515 Encounter for palliative care: Secondary | ICD-10-CM | POA: Diagnosis not present

## 2020-10-11 DIAGNOSIS — G546 Phantom limb syndrome with pain: Secondary | ICD-10-CM | POA: Diagnosis not present

## 2020-10-11 DIAGNOSIS — R531 Weakness: Secondary | ICD-10-CM | POA: Diagnosis not present

## 2020-10-11 DIAGNOSIS — N186 End stage renal disease: Secondary | ICD-10-CM | POA: Diagnosis not present

## 2020-10-11 DIAGNOSIS — R2681 Unsteadiness on feet: Secondary | ICD-10-CM | POA: Diagnosis not present

## 2020-10-11 DIAGNOSIS — R1312 Dysphagia, oropharyngeal phase: Secondary | ICD-10-CM | POA: Diagnosis not present

## 2020-10-11 DIAGNOSIS — R279 Unspecified lack of coordination: Secondary | ICD-10-CM | POA: Diagnosis not present

## 2020-10-11 DIAGNOSIS — Z4781 Encounter for orthopedic aftercare following surgical amputation: Secondary | ICD-10-CM | POA: Diagnosis not present

## 2020-10-11 DIAGNOSIS — M6281 Muscle weakness (generalized): Secondary | ICD-10-CM | POA: Diagnosis not present

## 2020-10-11 NOTE — Progress Notes (Signed)
[facility] Palliative Care Consult Note Telephone: [phone]  Fax: [phone]    Date of encounter: 10/11/20 PATIENT NAME: Elijah White [place] [id number]   [phone] (home)  DOB: 1940-07-30 MRN: [id number] PRIMARY CARE PROVIDER:    [name], [place],  [address] [place] [address] [place] [address] [phone]  REFERRING PROVIDER:   [name], [address] [place],  [place] [address] [phone]  RESPONSIBLE PARTY:    Contact Information     Name Relation Home Work [place] Daughter [phone]  [phone]   Caryn Bee [phone]          I met face to face with patient and family in the facility. Palliative Care was asked to follow this patient by consultation request of  [name], MD to address advance care planning and complex medical decision making. This is a follow up visit.                                   ASSESSMENT AND PLAN / RECOMMENDATIONS:   Advance Care Planning/Goals of Care: Goals include to maximize quality of life and symptom management. Open discussion with daughter about patient continuing dialysis; daughter wants to makes sure it his solely his decision to continue.   CODE STATUS: DNR  Symptom Management/Plan:  ESRD-patient plans to continue HD on MWF as scheduled.   Phantom pain-continue gabapentin 200mg  alternating with 100mg  QHS.  Weakness, impaired mobility-patient continues on PT. Asked nursing staff about upper bed rails to help aid in repositioning in bed. Daughter to also bring in a recliner for patient's room for comfort.    Follow up Palliative Care Visit: Palliative care will continue to follow for complex medical decision making, advance care planning, and clarification of goals. Return in 8 weeks or prn.  I spent 35 minutes providing this consultation. More than 50% of the time in this consultation was  spent in counseling and care coordination.   PPS: 40%  HOSPICE ELIGIBILITY/DIAGNOSIS: TBD  Chief Complaint: Palliative Medicine follow up visit.   HISTORY OF PRESENT ILLNESS:  Elijah White is a 80 y.o. year old male  with ESRD on hemodialysis MWF, Type 2 diabetes, diabetic neuropathy. Patient hospitalized 6/10-6/17/2022 due to gangrene from atherosclerosis, status post left below the knee amputation. ER visit on 09/26/2020 due to tremors; which resolved.   Patient resides at [facility]. He reports doing well. He reports occasional phantom pain; current gabapentin and acetaminophen is effective for pain. He denies shortness of breath, constipation. Endorses a fair appetite. His sleep varies; daughter states when he sleeps during the day, he is up at night. He is tolerating full HD sessions. No recent falls or injury reported. He is currently receiving PT. Patient has some difficulty sitting up to w/c for extended periods; daughter plans to bring recliner to facility to aid patient in being out of bed more and for comfort.   History obtained from review of EMR, discussion with primary team, and interview with family, facility staff/caregiver and/or Mr. Trickett.  I reviewed available labs, medications, imaging, studies and related documents from the EMR.  Records reviewed and summarized above.   ROS  General: NAD EYES: legally blind ENMT: denies dysphagia Cardiovascular: denies chest pain, denies DOE Pulmonary: denies cough, denies increased SOB Abdomen: endorses fair appetite, denies constipation GU: denies dysuria  MSK:  weakness,  no falls reported Skin: denies rashes  Neurological: denies pain Psych: Endorses stable mood Heme/lymph/immuno: denies bruises, abnormal bleeding  Physical Exam: Weight: 150.2 pounds Pulse 76, resp 16 Constitutional: NAD General: frail appearing  EYES: anicteric sclera, lids intact, no discharge  ENMT: intact hearing, oral mucous membranes  moist CV: S1S2, RRR, no LE edema Pulmonary: LCTA, no increased work of breathing, no cough, room air Abdomen: normo-active BS + 4 quadrants, soft and non tender GU: deferred MSK: moves extremities, non ambulatory Skin: warm and dry, no rashes or wounds on visible skin, left BKA, Allevyn dressing to right heel Neuro: generalized weakness, A & O x 2 Psych: non-anxious affect, pleasant Hem/lymph/immuno: no widespread bruising   Thank you for the opportunity to participate in the care of Mr. Kallal.  The palliative care team will continue to follow. Please call our office at [phone] if we can be of additional assistance.   Ezekiel Slocumb, NP   COVID-19 PATIENT SCREENING TOOL Asked and negative response unless otherwise noted:   Have you had symptoms of covid, tested positive or been in contact with someone with symptoms/positive test in the past 5-10 days? No

## 2020-10-12 ENCOUNTER — Emergency Department (HOSPITAL_COMMUNITY)
Admission: EM | Admit: 2020-10-12 | Discharge: 2020-10-13 | Disposition: A | Discharge status: Home / Self Care | Payer: Medicare Other | Attending: Emergency Medicine | Admitting: Emergency Medicine

## 2020-10-12 DIAGNOSIS — N2581 Secondary hyperparathyroidism of renal origin: Secondary | ICD-10-CM | POA: Diagnosis not present

## 2020-10-12 DIAGNOSIS — Z87891 Personal history of nicotine dependence: Secondary | ICD-10-CM | POA: Insufficient documentation

## 2020-10-12 DIAGNOSIS — E1122 Type 2 diabetes mellitus with diabetic chronic kidney disease: Secondary | ICD-10-CM | POA: Diagnosis not present

## 2020-10-12 DIAGNOSIS — Z7982 Long term (current) use of aspirin: Secondary | ICD-10-CM | POA: Diagnosis not present

## 2020-10-12 DIAGNOSIS — R58 Hemorrhage, not elsewhere classified: Secondary | ICD-10-CM | POA: Diagnosis not present

## 2020-10-12 DIAGNOSIS — D631 Anemia in chronic kidney disease: Secondary | ICD-10-CM | POA: Diagnosis not present

## 2020-10-12 DIAGNOSIS — Z743 Need for continuous supervision: Secondary | ICD-10-CM | POA: Diagnosis not present

## 2020-10-12 DIAGNOSIS — I132 Hypertensive heart and chronic kidney disease with heart failure and with stage 5 chronic kidney disease, or end stage renal disease: Secondary | ICD-10-CM | POA: Insufficient documentation

## 2020-10-12 DIAGNOSIS — R1312 Dysphagia, oropharyngeal phase: Secondary | ICD-10-CM | POA: Diagnosis not present

## 2020-10-12 DIAGNOSIS — Z8546 Personal history of malignant neoplasm of prostate: Secondary | ICD-10-CM | POA: Insufficient documentation

## 2020-10-12 DIAGNOSIS — N186 End stage renal disease: Secondary | ICD-10-CM | POA: Diagnosis not present

## 2020-10-12 DIAGNOSIS — Y841 Kidney dialysis as the cause of abnormal reaction of the patient, or of later complication, without mention of misadventure at the time of the procedure: Secondary | ICD-10-CM | POA: Insufficient documentation

## 2020-10-12 DIAGNOSIS — R2681 Unsteadiness on feet: Secondary | ICD-10-CM | POA: Diagnosis not present

## 2020-10-12 DIAGNOSIS — D509 Iron deficiency anemia, unspecified: Secondary | ICD-10-CM | POA: Diagnosis not present

## 2020-10-12 DIAGNOSIS — Z79899 Other long term (current) drug therapy: Secondary | ICD-10-CM | POA: Insufficient documentation

## 2020-10-12 DIAGNOSIS — M6281 Muscle weakness (generalized): Secondary | ICD-10-CM | POA: Diagnosis not present

## 2020-10-12 DIAGNOSIS — I5033 Acute on chronic diastolic (congestive) heart failure: Secondary | ICD-10-CM | POA: Diagnosis not present

## 2020-10-12 DIAGNOSIS — T82838A Hemorrhage of vascular prosthetic devices, implants and grafts, initial encounter: Secondary | ICD-10-CM

## 2020-10-12 DIAGNOSIS — R279 Unspecified lack of coordination: Secondary | ICD-10-CM | POA: Diagnosis not present

## 2020-10-12 DIAGNOSIS — Z992 Dependence on renal dialysis: Secondary | ICD-10-CM | POA: Diagnosis not present

## 2020-10-12 DIAGNOSIS — Z4781 Encounter for orthopedic aftercare following surgical amputation: Secondary | ICD-10-CM | POA: Diagnosis not present

## 2020-10-12 MED ORDER — CARVEDILOL 3.125 MG PO TABS
3.1250 mg | ORAL_TABLET | Freq: Once | ORAL | Status: AC
  Administered 2020-10-12: 3.125 mg via ORAL
  Filled 2020-10-12: qty 1

## 2020-10-12 MED ORDER — GABAPENTIN 100 MG PO CAPS
200.0000 mg | ORAL_CAPSULE | Freq: Once | ORAL | Status: AC
  Administered 2020-10-12: 200 mg via ORAL
  Filled 2020-10-12: qty 2

## 2020-10-12 NOTE — Discharge Instructions (Addendum)
Call your vascular doctors to see if you still need to be on the Plavix and the aspirin.  Otherwise it is fine for you to return to dialysis on Wednesday.  You can take the bandage off of your arm tomorrow morning

## 2020-10-12 NOTE — ED Notes (Signed)
3rd on PTAR list  

## 2020-10-12 NOTE — ED Notes (Signed)
Pt updated, 5th on PTAR list

## 2020-10-12 NOTE — ED Provider Notes (Signed)
[place] EMERGENCY DEPARTMENT Provider Note   CSN: [id number] Arrival date & time: 10/12/20  1611     History No chief complaint on file.   Elijah White is a 80 y.o. male.  Patient is an 80 year old male with a history of aortic stenosis status post TAVR, end-stage renal disease on dialysis, diabetes, PAD, blindness and hypertension who is presenting today from the dialysis unit due to inability to stop his graft from bleeding.  He reports he was feeling fine when he went to dialysis and feels fine now.  However they could not get the bleeding to stop.  He has been bleeding since about 230 and it is now 5.  They put a clamp on it and he has had no bleeding since.  He occasionally will have issues but is never blood for this long in the past.  He denies any numbness or tingling in his hand.  He is currently taking aspirin and Plavix per his vascular surgeon.  The history is provided by the patient.      Past Medical History:  Diagnosis Date   Anemia    low iron   Aortic stenosis    s/p TAVR 01/19/20   Arthritis    Cancer [facility])    prostate, s/p I-125 seed implant 05/18/05   Colon polyps ~ 1993 and 2003   Dr [name], [facility].  08/2001 colonoscopy: tubular adenoma at cecum.     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, 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[place]) 08/12/2020   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   Acute on chronic diastolic heart failure ([place]) 01/19/2020   S/P TAVR  (transcatheter aortic valve replacement) 01/19/2020   RBBB    Hypercalcemia 12/16/2019   Near syncope 12/13/2019   Moderate protein-calorie malnutrition ([place]) 12/04/2019   Severe aortic stenosis    ESRD (end stage renal disease) ([place]) 11/26/2019   Macrocytic anemia 11/26/2019   Adrenal mass ([place]) 11/26/2019   Colonic mass 11/26/2019   Allergy, unspecified, initial encounter 11/16/2019   Anaphylactic shock, unspecified, initial encounter 11/16/2019   Other long term (current) drug therapy 09/05/2019   Iron deficiency anemia, unspecified 07/13/2019   Encounter for immunization 04/17/2019   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   Dependence on renal dialysis ([place]) 04/16/2019   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 kidney disease, or unspecified chronic kidney disease 04/16/2019   Localized edema 04/16/2019   Malignant neoplasm of prostate ([place]) 04/16/2019   Nicotine dependence, unspecified, uncomplicated 66/44/0347   Other disorders of phosphorus metabolism 04/16/2019   Pain, unspecified 04/16/2019   Pruritus, unspecified 04/16/2019   Secondary hyperparathyroidism of renal origin ([place]) 04/16/2019   Shortness of breath 04/16/2019   Unspecified glaucoma 04/16/2019   Unspecified urinary incontinence 04/16/2019   ARF (acute renal failure) ([place]) 03/25/2018   Vision loss of right eye 11/11/2017  Bilateral pseudophakia 07/11/2017   GERD (gastroesophageal reflux disease) 09/10/2016   HLD (hyperlipidemia) 09/10/2016   Essential hypertension 08/13/2016   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   Type 2 diabetes mellitus with renal manifestations ([place]) 10/22/2006   BPH (benign prostatic hyperplasia) 10/22/2006   COLONIC POLYPS, HX OF 10/22/2006    Past Surgical History:  Procedure Laterality Date   ABDOMINAL AORTOGRAM W/LOWER EXTREMITY N/A 07/25/2020   Procedure: ABDOMINAL AORTOGRAM W/LOWER EXTREMITY;  Surgeon: Marty Heck, MD;  Location:  [place] [facility];  Service: Cardiovascular;  Laterality: N/A;   AMPUTATION Left 08/12/2020   Procedure: LEFT BELOW KNEE AMPUTATION;  Surgeon: Marty Heck, MD;  Location: [place];  Service: Vascular;  Laterality: Left;   AV FISTULA PLACEMENT Left 03/20/2018   Procedure: ARTERIOVENOUS (AV) FISTULA CREATION ARM;  Surgeon: [name], MD;  Location: [place];  Service: Vascular;  Laterality: Left;   [place] Left 05/21/2018   Procedure: LEFT BASILIC VEIN FISTULA SECOND STAGE;  Surgeon: [name], MD;  Location: [place];  Service: Vascular;  Laterality: Left;   COLONOSCOPY     COLONOSCOPY WITH PROPOFOL N/A 11/30/2019   Procedure: COLONOSCOPY WITH PROPOFOL;  Surgeon: Ronnette Juniper, MD;  Location: [place];  Service: Gastroenterology;  Laterality: N/A;   GLAUCOMA SURGERY  2019   multiple surgeries   HEMOSTASIS CLIP PLACEMENT  11/30/2019   Procedure: HEMOSTASIS CLIP PLACEMENT;  Surgeon: Ronnette Juniper, MD;  Location: [facility] ENDOSCOPY;  Service: Gastroenterology;;   KNEE ARTHROSCOPY     MULTIPLE EXTRACTIONS WITH ALVEOLOPLASTY N/A 12/30/2019   Procedure: MULTIPLE EXTRACTION WITH ALVEOLOPLASTY;  Surgeon: Charlaine Dalton, DMD;  Location: [place];  Service: Dentistry;  Laterality: N/A;   PERIPHERAL VASCULAR INTERVENTION Left 07/25/2020   Procedure: PERIPHERAL VASCULAR INTERVENTION;  Surgeon: Marty Heck, MD;  Location: [place] [facility];  Service: Cardiovascular;  Laterality: Left;  superficial femoral   POLYPECTOMY  11/30/2019   Procedure: POLYPECTOMY;  Surgeon: Ronnette Juniper, MD;  Location: [place];  Service: Gastroenterology;;   PROSTATE SURGERY     RIGHT/LEFT HEART CATH AND CORONARY ANGIOGRAPHY N/A 12/03/2019   Procedure: RIGHT/LEFT HEART CATH AND CORONARY ANGIOGRAPHY;  Surgeon: Burnell Blanks, MD;  Location: [place] [facility];  Service: Cardiovascular;  Laterality: N/A;   SUBMUCOSAL TATTOO INJECTION  11/30/2019   Procedure: SUBMUCOSAL  TATTOO INJECTION;  Surgeon: Ronnette Juniper, MD;  Location: [place];  Service: Gastroenterology;;   TEE WITHOUT CARDIOVERSION N/A 01/19/2020   Procedure: TRANSESOPHAGEAL ECHOCARDIOGRAM (TEE);  Surgeon: Burnell Blanks, MD;  Location: [place] [facility];  Service: Open Heart Surgery;  Laterality: N/A;   TRANSCATHETER AORTIC VALVE REPLACEMENT, TRANSFEMORAL Left 01/19/2020   Procedure: TRANSCATHETER AORTIC VALVE REPLACEMENT, LEFT TRANSFEMORAL;  Surgeon: Burnell Blanks, MD;  Location: [place] [facility];  Service: Open Heart Surgery;  Laterality: Left;       Family History  Problem Relation Age of Onset   CAD Mother     Social History   Tobacco Use   Smoking status: Former    Packs/day: 0.50    Types: Cigarettes    Quit date: 03/23/2018    Years since quitting: 2.5   Smokeless tobacco: Never  Vaping Use   Vaping Use: Never used  Substance Use Topics   Alcohol use: No   Drug use: No    Home Medications Prior to Admission medications   Medication Sig Start Date End Date Taking? Authorizing Provider  acetaminophen (TYLENOL) 500 MG tablet Take 1,000 mg by  mouth every 6 (six) hours as needed for moderate pain or headache.    [provider]  aspirin EC 81 MG tablet Take 81 mg by mouth in the morning. Swallow whole.    [provider]  atorvastatin (LIPITOR) 10 MG tablet Take 1 tablet (10 mg total) by mouth in the morning. 08/16/20   Rhyne, Hulen Shouts, PA-C  AURYXIA 1 GM 210 MG(Fe) tablet Take 420 mg by mouth 3 (three) times daily with meals. 05/30/20   [provider]  B Complex-C-Folic Acid (DIALYVITE 409) 0.8 MG TABS Take 1 tablet by mouth in the morning. 05/08/19   [provider]  blood glucose meter kit and supplies KIT Dispense based on patient and insurance preference. Use up to four times daily as directed. (FOR ICD-9 250.00, 250.01). For QAC - HS accuchecks. 03/30/18   Thurnell Lose, MD  brimonidine (ALPHAGAN) 0.15 %  ophthalmic solution Place 1 drop into the right eye 3 (three) times daily.    [provider]  carvedilol (COREG) 3.125 MG tablet Take 1 tablet (3.125 mg total) by mouth 2 (two) times daily with a meal. 08/16/20   Rhyne, Hulen Shouts, PA-C  Cinacalcet HCl (SENSIPAR PO) Take by mouth. 08/31/20 08/30/21  [provider]  clopidogrel (PLAVIX) 75 MG tablet Take 1 tablet (75 mg total) by mouth daily with breakfast. 07/25/20   Marty Heck, MD  dorzolamide-timolol (COSOPT) 22.3-6.8 MG/ML ophthalmic solution Place 1 drop into the right eye 2 (two) times daily. 06/25/17   [provider]  Doxercalciferol (HECTOROL IV) Doxercalciferol (Hectorol) 08/09/20 08/28/21  [provider]  folic acid-vitamin b complex-vitamin c-selenium-zinc (DIALYVITE) 3 MG TABS tablet Take 1 tablet by mouth daily.    [provider]  gabapentin (NEURONTIN) 100 MG capsule Take 1-2 capsules (100-200 mg total) by mouth See admin instructions. Take 2 capsules (200 mg) by mouth daily at bedtime on Tues, Thurs, and Sat. Take 1 capsule (100 mg) by mouth daily at bedtime daily on Mon, Wed, Fri, and Sun. 08/16/20   Rhyne, Hulen Shouts, PA-C  hydrocortisone cream 1 % Apply 1 application topically 3 (three) times daily as needed for itching.    [provider]  hydroxypropyl methylcellulose / hypromellose (ISOPTO TEARS / GONIOVISC) 2.5 % ophthalmic solution Place 1 drop into the left eye as needed for dry eyes.    [provider]  isosorbide mononitrate (IMDUR) 30 MG 24 hr tablet Take 1 tablet (30 mg total) by mouth in the morning. 08/16/20   Rhyne, Hulen Shouts, PA-C  Lancets (ONETOUCH DELICA PLUS [id number]) MISC  03/31/18   [provider]  Methoxy PEG-Epoetin Beta (MIRCERA IJ) Mircera 09/05/20 09/04/21  [provider]  ONETOUCH VERIO test strip USE AS DIRECTED TO TEST FOUR TIMES A DAY 07/31/18   [name], MD  oxyCODONE-acetaminophen (PERCOCET) 5-325 MG tablet Take 1  tablet by mouth every 6 (six) hours as needed (pain). 08/19/20   Baglia, Corrina, PA-C  pantoprazole (PROTONIX) 40 MG tablet Take 1 tablet (40 mg total) by mouth daily at 12 noon. 08/16/20   Rhyne, Hulen Shouts, PA-C  pilocarpine (PILOCAR) 4 % ophthalmic solution Place 1 drop into the right eye 4 (four) times daily.     [provider]  ROCKLATAN 0.02-0.005 % SOLN Place 1 drop into the right eye at bedtime. 06/20/20   [provider]    Allergies    Patient has no known allergies.  Review of Systems   Review of Systems  All other systems reviewed and are negative.  Physical Exam Updated Vital Signs BP (!) 161/70   Pulse 69   Temp 98.3 F (36.8 C) (Oral)   Resp 18   SpO2 99%   Physical Exam Vitals and nursing note reviewed.  Constitutional:      General: He is not in acute distress.    Appearance: He is not ill-appearing.  HENT:     Head: Normocephalic and atraumatic.     Mouth/Throat:     Mouth: Mucous membranes are moist.  Cardiovascular:     Rate and Rhythm: Normal rate.     Pulses: Normal pulses.  Pulmonary:     Effort: Pulmonary effort is normal.  Musculoskeletal:     Cervical back: Normal range of motion and neck supple.     Comments: Left upper extremity with dialysis fistula.  Clamp and bandages removed and there is no further bleeding.  Clot has formed and good thrill.  Neurological:     Mental Status: He is alert and oriented to person, place, and time. Mental status is at baseline.  Psychiatric:        Mood and Affect: Mood normal.        Behavior: Behavior normal.    ED Results / Procedures / Treatments   Labs (all labs ordered are listed, but only abnormal results are displayed) Labs Reviewed - No data to display  EKG None  Radiology No results found.  Procedures Procedures   Medications Ordered in ED Medications - No data to display  ED Course  I have reviewed the triage vital signs and the nursing notes.  Pertinent labs &  imaging results that were available during my care of the patient were reviewed by me and considered in my medical decision making (see chart for details).    MDM Rules/Calculators/A&P                           Patient is an 80 year old male presenting today after his dialysis fistula would not quit bleeding when he was de-accessed.  A clamp was placed and was in place for approximately 2 hours.  When it was removed here he is no longer bleeding.  A dressing was placed and he had no further bleeding.  He does take Plavix and aspirin and discussed with he and his daughter about possibly getting that changed in the future.  He will get transport back to [place].  Final Clinical Impression(s) / ED Diagnoses Final diagnoses:  Bleeding from dialysis shunt, initial encounter [facility])    Rx / DC Orders ED Discharge Orders     None        Blanchie Dessert, MD 10/12/20 1831

## 2020-10-12 NOTE — ED Notes (Signed)
[facility] called for pt transport. 

## 2020-10-12 NOTE — ED Triage Notes (Addendum)
Pt bib ems from dialysis center after dialysis could not get graft to stop bleeding. Clamp applied by dialysis staff at approx 1530. Bleeding controlled upon arrival. 170/80, HR 68, 99% RA. Pt completed full tx today.

## 2020-10-13 ENCOUNTER — Telehealth: Payer: Self-pay

## 2020-10-13 DIAGNOSIS — M6281 Muscle weakness (generalized): Secondary | ICD-10-CM | POA: Diagnosis not present

## 2020-10-13 DIAGNOSIS — R58 Hemorrhage, not elsewhere classified: Secondary | ICD-10-CM | POA: Diagnosis not present

## 2020-10-13 DIAGNOSIS — R1312 Dysphagia, oropharyngeal phase: Secondary | ICD-10-CM | POA: Diagnosis not present

## 2020-10-13 DIAGNOSIS — Z743 Need for continuous supervision: Secondary | ICD-10-CM | POA: Diagnosis not present

## 2020-10-13 DIAGNOSIS — R279 Unspecified lack of coordination: Secondary | ICD-10-CM | POA: Diagnosis not present

## 2020-10-13 DIAGNOSIS — R2681 Unsteadiness on feet: Secondary | ICD-10-CM | POA: Diagnosis not present

## 2020-10-13 DIAGNOSIS — Z4781 Encounter for orthopedic aftercare following surgical amputation: Secondary | ICD-10-CM | POA: Diagnosis not present

## 2020-10-13 NOTE — Telephone Encounter (Signed)
Pt called to complete TCM call. An employee for [facility] notified me to call back whenever scheduler arrives at the office around 9.

## 2020-10-14 DIAGNOSIS — Z4781 Encounter for orthopedic aftercare following surgical amputation: Secondary | ICD-10-CM | POA: Diagnosis not present

## 2020-10-14 DIAGNOSIS — R1312 Dysphagia, oropharyngeal phase: Secondary | ICD-10-CM | POA: Diagnosis not present

## 2020-10-14 DIAGNOSIS — R2681 Unsteadiness on feet: Secondary | ICD-10-CM | POA: Diagnosis not present

## 2020-10-14 DIAGNOSIS — Z992 Dependence on renal dialysis: Secondary | ICD-10-CM | POA: Diagnosis not present

## 2020-10-14 DIAGNOSIS — D631 Anemia in chronic kidney disease: Secondary | ICD-10-CM | POA: Diagnosis not present

## 2020-10-14 DIAGNOSIS — D509 Iron deficiency anemia, unspecified: Secondary | ICD-10-CM | POA: Diagnosis not present

## 2020-10-14 DIAGNOSIS — M6281 Muscle weakness (generalized): Secondary | ICD-10-CM | POA: Diagnosis not present

## 2020-10-14 DIAGNOSIS — N186 End stage renal disease: Secondary | ICD-10-CM | POA: Diagnosis not present

## 2020-10-14 DIAGNOSIS — N2581 Secondary hyperparathyroidism of renal origin: Secondary | ICD-10-CM | POA: Diagnosis not present

## 2020-10-14 DIAGNOSIS — R279 Unspecified lack of coordination: Secondary | ICD-10-CM | POA: Diagnosis not present

## 2020-10-15 DIAGNOSIS — R2681 Unsteadiness on feet: Secondary | ICD-10-CM | POA: Diagnosis not present

## 2020-10-15 DIAGNOSIS — M6281 Muscle weakness (generalized): Secondary | ICD-10-CM | POA: Diagnosis not present

## 2020-10-15 DIAGNOSIS — R1312 Dysphagia, oropharyngeal phase: Secondary | ICD-10-CM | POA: Diagnosis not present

## 2020-10-15 DIAGNOSIS — R279 Unspecified lack of coordination: Secondary | ICD-10-CM | POA: Diagnosis not present

## 2020-10-15 DIAGNOSIS — Z4781 Encounter for orthopedic aftercare following surgical amputation: Secondary | ICD-10-CM | POA: Diagnosis not present

## 2020-10-16 DIAGNOSIS — R279 Unspecified lack of coordination: Secondary | ICD-10-CM | POA: Diagnosis not present

## 2020-10-16 DIAGNOSIS — R1312 Dysphagia, oropharyngeal phase: Secondary | ICD-10-CM | POA: Diagnosis not present

## 2020-10-16 DIAGNOSIS — M6281 Muscle weakness (generalized): Secondary | ICD-10-CM | POA: Diagnosis not present

## 2020-10-16 DIAGNOSIS — Z4781 Encounter for orthopedic aftercare following surgical amputation: Secondary | ICD-10-CM | POA: Diagnosis not present

## 2020-10-16 DIAGNOSIS — R2681 Unsteadiness on feet: Secondary | ICD-10-CM | POA: Diagnosis not present

## 2020-10-17 DIAGNOSIS — Z992 Dependence on renal dialysis: Secondary | ICD-10-CM | POA: Diagnosis not present

## 2020-10-17 DIAGNOSIS — D509 Iron deficiency anemia, unspecified: Secondary | ICD-10-CM | POA: Diagnosis not present

## 2020-10-17 DIAGNOSIS — M6281 Muscle weakness (generalized): Secondary | ICD-10-CM | POA: Diagnosis not present

## 2020-10-17 DIAGNOSIS — D631 Anemia in chronic kidney disease: Secondary | ICD-10-CM | POA: Diagnosis not present

## 2020-10-17 DIAGNOSIS — N186 End stage renal disease: Secondary | ICD-10-CM | POA: Diagnosis not present

## 2020-10-17 DIAGNOSIS — R2681 Unsteadiness on feet: Secondary | ICD-10-CM | POA: Diagnosis not present

## 2020-10-17 DIAGNOSIS — Z4781 Encounter for orthopedic aftercare following surgical amputation: Secondary | ICD-10-CM | POA: Diagnosis not present

## 2020-10-17 DIAGNOSIS — R1312 Dysphagia, oropharyngeal phase: Secondary | ICD-10-CM | POA: Diagnosis not present

## 2020-10-17 DIAGNOSIS — N2581 Secondary hyperparathyroidism of renal origin: Secondary | ICD-10-CM | POA: Diagnosis not present

## 2020-10-17 DIAGNOSIS — R279 Unspecified lack of coordination: Secondary | ICD-10-CM | POA: Diagnosis not present

## 2020-10-18 DIAGNOSIS — R1312 Dysphagia, oropharyngeal phase: Secondary | ICD-10-CM | POA: Diagnosis not present

## 2020-10-18 DIAGNOSIS — M6281 Muscle weakness (generalized): Secondary | ICD-10-CM | POA: Diagnosis not present

## 2020-10-18 DIAGNOSIS — R2681 Unsteadiness on feet: Secondary | ICD-10-CM | POA: Diagnosis not present

## 2020-10-18 DIAGNOSIS — Z4781 Encounter for orthopedic aftercare following surgical amputation: Secondary | ICD-10-CM | POA: Diagnosis not present

## 2020-10-18 DIAGNOSIS — R279 Unspecified lack of coordination: Secondary | ICD-10-CM | POA: Diagnosis not present

## 2020-10-19 ENCOUNTER — Encounter (HOSPITAL_COMMUNITY): Payer: Self-pay

## 2020-10-19 ENCOUNTER — Emergency Department (HOSPITAL_COMMUNITY): Payer: Medicare Other

## 2020-10-19 ENCOUNTER — Other Ambulatory Visit: Payer: Self-pay

## 2020-10-19 ENCOUNTER — Emergency Department (HOSPITAL_COMMUNITY)
Admission: EM | Admit: 2020-10-19 | Discharge: 2020-10-20 | Disposition: A | Discharge status: Skilled Nursing Facility | Payer: Medicare Other | Attending: Emergency Medicine | Admitting: Emergency Medicine

## 2020-10-19 DIAGNOSIS — I120 Hypertensive chronic kidney disease with stage 5 chronic kidney disease or end stage renal disease: Secondary | ICD-10-CM | POA: Diagnosis not present

## 2020-10-19 DIAGNOSIS — Z87891 Personal history of nicotine dependence: Secondary | ICD-10-CM | POA: Insufficient documentation

## 2020-10-19 DIAGNOSIS — D631 Anemia in chronic kidney disease: Secondary | ICD-10-CM | POA: Diagnosis not present

## 2020-10-19 DIAGNOSIS — N2581 Secondary hyperparathyroidism of renal origin: Secondary | ICD-10-CM | POA: Diagnosis not present

## 2020-10-19 DIAGNOSIS — Z8546 Personal history of malignant neoplasm of prostate: Secondary | ICD-10-CM | POA: Diagnosis not present

## 2020-10-19 DIAGNOSIS — I10 Essential (primary) hypertension: Secondary | ICD-10-CM | POA: Diagnosis not present

## 2020-10-19 DIAGNOSIS — Z7982 Long term (current) use of aspirin: Secondary | ICD-10-CM | POA: Insufficient documentation

## 2020-10-19 DIAGNOSIS — E1122 Type 2 diabetes mellitus with diabetic chronic kidney disease: Secondary | ICD-10-CM | POA: Diagnosis not present

## 2020-10-19 DIAGNOSIS — Z743 Need for continuous supervision: Secondary | ICD-10-CM | POA: Diagnosis not present

## 2020-10-19 DIAGNOSIS — G20 Parkinson's disease: Secondary | ICD-10-CM | POA: Diagnosis not present

## 2020-10-19 DIAGNOSIS — Z79899 Other long term (current) drug therapy: Secondary | ICD-10-CM | POA: Diagnosis not present

## 2020-10-19 DIAGNOSIS — R251 Tremor, unspecified: Secondary | ICD-10-CM | POA: Diagnosis not present

## 2020-10-19 DIAGNOSIS — I672 Cerebral atherosclerosis: Secondary | ICD-10-CM | POA: Diagnosis not present

## 2020-10-19 DIAGNOSIS — G4489 Other headache syndrome: Secondary | ICD-10-CM | POA: Diagnosis not present

## 2020-10-19 DIAGNOSIS — Z992 Dependence on renal dialysis: Secondary | ICD-10-CM | POA: Diagnosis not present

## 2020-10-19 DIAGNOSIS — D509 Iron deficiency anemia, unspecified: Secondary | ICD-10-CM | POA: Diagnosis not present

## 2020-10-19 DIAGNOSIS — N186 End stage renal disease: Secondary | ICD-10-CM | POA: Insufficient documentation

## 2020-10-19 DIAGNOSIS — R6889 Other general symptoms and signs: Secondary | ICD-10-CM | POA: Diagnosis not present

## 2020-10-19 DIAGNOSIS — Z7902 Long term (current) use of antithrombotics/antiplatelets: Secondary | ICD-10-CM | POA: Insufficient documentation

## 2020-10-19 LAB — CBC WITH DIFFERENTIAL/PLATELET
Abs Immature Granulocytes: 0 10*3/uL (ref 0.00–0.07)
Basophils Absolute: 0.1 10*3/uL (ref 0.0–0.1)
Basophils Relative: 1 %
Eosinophils Absolute: 0.3 10*3/uL (ref 0.0–0.5)
Eosinophils Relative: 6 %
HCT: 38.4 % — ABNORMAL LOW (ref 39.0–52.0)
Hemoglobin: 12.6 g/dL — ABNORMAL LOW (ref 13.0–17.0)
Immature Granulocytes: 0 %
Lymphocytes Relative: 29 %
Lymphs Abs: 1.4 10*3/uL (ref 0.7–4.0)
MCH: 33.8 pg (ref 26.0–34.0)
MCHC: 32.8 g/dL (ref 30.0–36.0)
MCV: 102.9 fL — ABNORMAL HIGH (ref 80.0–100.0)
Monocytes Absolute: 0.7 10*3/uL (ref 0.1–1.0)
Monocytes Relative: 15 %
Neutro Abs: 2.4 10*3/uL (ref 1.7–7.7)
Neutrophils Relative %: 49 %
Platelets: 224 10*3/uL (ref 150–400)
RBC: 3.73 MIL/uL — ABNORMAL LOW (ref 4.22–5.81)
RDW: 13.6 % (ref 11.5–15.5)
WBC: 4.9 10*3/uL (ref 4.0–10.5)
nRBC: 0 % (ref 0.0–0.2)

## 2020-10-19 LAB — BASIC METABOLIC PANEL
Anion gap: 12 (ref 5–15)
BUN: 10 mg/dL (ref 8–23)
CO2: 31 mmol/L (ref 22–32)
Calcium: 8.8 mg/dL — ABNORMAL LOW (ref 8.9–10.3)
Chloride: 92 mmol/L — ABNORMAL LOW (ref 98–111)
Creatinine, Ser: 3.5 mg/dL — ABNORMAL HIGH (ref 0.61–1.24)
GFR, Estimated: 17 mL/min — ABNORMAL LOW (ref >60)
Glucose, Bld: 112 mg/dL — ABNORMAL HIGH (ref 70–99)
Potassium: 3.9 mmol/L (ref 3.5–5.1)
Sodium: 135 mmol/L (ref 135–145)

## 2020-10-19 LAB — HEPATIC FUNCTION PANEL
ALT: 16 U/L (ref 0–44)
AST: 24 U/L (ref 15–41)
Albumin: 3.8 g/dL (ref 3.5–5.0)
Alkaline Phosphatase: 73 U/L (ref 38–126)
Bilirubin, Direct: <0.1 mg/dL (ref 0.0–0.2)
Total Bilirubin: 0.6 mg/dL (ref 0.3–1.2)
Total Protein: 7.1 g/dL (ref 6.5–8.1)

## 2020-10-19 LAB — AMMONIA: Ammonia: 38 umol/L — ABNORMAL HIGH (ref 9–35)

## 2020-10-19 MED ORDER — CARBIDOPA-LEVODOPA 25-100 MG PO TABS: 1.0000 | ORAL_TABLET | Freq: Three times a day (TID) | ORAL | 1 refills | Status: AC

## 2020-10-19 MED ORDER — GABAPENTIN 100 MG PO CAPS: 100.0000 mg | ORAL_CAPSULE | Freq: Every day | ORAL | 0 refills | Status: DC

## 2020-10-19 MED ORDER — CARBIDOPA-LEVODOPA 25-100 MG PO TABS
1.0000 | ORAL_TABLET | Freq: Three times a day (TID) | ORAL | Status: DC
  Administered 2020-10-20: 1 via ORAL
  Filled 2020-10-19: qty 1

## 2020-10-19 NOTE — Consult Note (Addendum)
NEUROLOGY CONSULTATION NOTE   Date of service: October 19, 2020 Patient Name: Elijah White MRN:  [id number] DOB:  02-19-1941 Reason for consult: "Tremors" Requesting Provider: Lennice Sites, DO _ _ _   _ __   _ __ _ _  __ __   _ __   __ _  History of Present Illness  Elijah White is a 80 y.o. male with PMH significant for  cancer, colon polyps, DM2, HLD, ESRD on HD, HTN, GERD, left eye legally blind, galucoma, PAD, recent BKA who presents with tremors.  Per daughter, he has intermittent episodes of almost full body tremoring which has been going on for the last 2 months since he had the BKA. The tremoring is voilent and full body and has been interfering with dialysis and with therapy. Daughter tried to get him in to outpatient neurology but could not get him in early so brought him to the ED.  Daughter showed me a video of one of the events of tremoring that his arms and legs were under the bed sheet and I could not see them clearly.  Appears to have very violent shaking of arms and legs with intact mentation.  It lasts 10 seconds at the most and then goes away.  Never had anything that is in the past.  The only recent medication that was changed was his gabapentin which was increased after the BKA for neuropathic pain.  There is no relationship between the movements and the dialysis.    ROS   Constitutional Denies weight loss, fever and chills.   HEENT Denies changes in vision and hearing.   Respiratory Denies SOB and cough.   CV Denies palpitations and CP   GI Denies abdominal pain, nausea, vomiting and diarrhea.   GU Denies dysuria and urinary frequency.   MSK Denies myalgia and joint pain.   Skin Denies rash and pruritus.   Neurological Denies headache and syncope.   Psychiatric Denies recent changes in mood. Denies anxiety and depression.    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  08/2001 colonoscopy: tubular adenoma at cecum.     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   Elevated cholesterol with high triglycerides    ESRD (end stage renal disease) (HCC)    [facility]   GERD (gastroesophageal reflux disease)    Glaucoma    Hypertension    Legally blind in left eye, as defined in [place]    has pinpoint vision in right eye   PAD (peripheral artery disease) ([place])    RBBB    Past Surgical History:  Procedure Laterality Date   ABDOMINAL AORTOGRAM W/LOWER EXTREMITY N/A 07/25/2020   Procedure: ABDOMINAL AORTOGRAM W/LOWER EXTREMITY;  Surgeon: Marty Heck, MD;  Location: [place] [facility];  Service: Cardiovascular;  Laterality: N/A;   AMPUTATION Left 08/12/2020   Procedure: LEFT BELOW KNEE AMPUTATION;  Surgeon: Marty Heck, MD;  Location: [facility] OR;  Service: Vascular;  Laterality: Left;   AV FISTULA PLACEMENT Left 03/20/2018   Procedure: ARTERIOVENOUS (AV) FISTULA CREATION ARM;  Surgeon: [name], MD;  Location: [place];  Service: Vascular;  Laterality: Left;   BASCILIC VEIN TRANSPOSITION Left 05/21/2018   Procedure: LEFT BASILIC VEIN FISTULA SECOND STAGE;  Surgeon: [name], MD;  Location: [place];  Service: Vascular;  Laterality: Left;   COLONOSCOPY     COLONOSCOPY WITH PROPOFOL N/A 11/30/2019   Procedure: COLONOSCOPY WITH PROPOFOL;  Surgeon: Ronnette Juniper, MD;  Location: [place];  Service: Gastroenterology;  Laterality: N/A;   GLAUCOMA SURGERY  2019   multiple surgeries   HEMOSTASIS CLIP PLACEMENT  11/30/2019   Procedure: HEMOSTASIS CLIP PLACEMENT;  Surgeon: Ronnette Juniper, MD;  Location: [facility] ENDOSCOPY;  Service: Gastroenterology;;   KNEE ARTHROSCOPY     MULTIPLE EXTRACTIONS WITH ALVEOLOPLASTY N/A 12/30/2019   Procedure: MULTIPLE EXTRACTION WITH ALVEOLOPLASTY;   Surgeon: Charlaine Dalton, DMD;  Location: [place];  Service: Dentistry;  Laterality: N/A;   PERIPHERAL VASCULAR INTERVENTION Left 07/25/2020   Procedure: PERIPHERAL VASCULAR INTERVENTION;  Surgeon: Marty Heck, MD;  Location: [place] [facility];  Service: Cardiovascular;  Laterality: Left;  superficial femoral   POLYPECTOMY  11/30/2019   Procedure: POLYPECTOMY;  Surgeon: Ronnette Juniper, MD;  Location: [place];  Service: Gastroenterology;;   PROSTATE SURGERY     RIGHT/LEFT HEART CATH AND CORONARY ANGIOGRAPHY N/A 12/03/2019   Procedure: RIGHT/LEFT HEART CATH AND CORONARY ANGIOGRAPHY;  Surgeon: Burnell Blanks, MD;  Location: [place] [facility];  Service: Cardiovascular;  Laterality: N/A;   SUBMUCOSAL TATTOO INJECTION  11/30/2019   Procedure: SUBMUCOSAL TATTOO INJECTION;  Surgeon: Ronnette Juniper, MD;  Location: [place];  Service: Gastroenterology;;   TEE WITHOUT CARDIOVERSION N/A 01/19/2020   Procedure: TRANSESOPHAGEAL ECHOCARDIOGRAM (TEE);  Surgeon: Burnell Blanks, MD;  Location: [place] [facility];  Service: Open Heart Surgery;  Laterality: N/A;   TRANSCATHETER AORTIC VALVE REPLACEMENT, TRANSFEMORAL Left 01/19/2020   Procedure: TRANSCATHETER AORTIC VALVE REPLACEMENT, LEFT TRANSFEMORAL;  Surgeon: Burnell Blanks, MD;  Location: [place] [facility];  Service: Open Heart Surgery;  Laterality: Left;   Family History  Problem Relation Age of Onset   CAD Mother    Social History   Socioeconomic History   Marital status: Widowed    Spouse name: Not on file   Number of children: Not on file   Years of education: Not on file   Highest education level: Not on file  Occupational History   Occupation: retired  Tobacco Use   Smoking status: Former    Packs/day: 0.50    Types: Cigarettes    Quit date: 03/23/2018    Years since quitting: 2.5   Smokeless tobacco: Never  Vaping Use   Vaping Use: Never used  Substance and Sexual Activity   Alcohol use: No   Drug  use: No   Sexual activity: Not Currently  Other Topics Concern   Not on file  Social History Narrative   Not on file   Social Determinants of Health   Financial Resource Strain: Low Risk    Difficulty of Paying Living Expenses: Not hard at all  Food Insecurity: No Food Insecurity   Worried About Charity fundraiser in the Last Year: Never true   [place] in the Last Year: Never true  Transportation Needs: No Transportation Needs   Lack of Transportation (Medical): No   Lack of Transportation (Non-Medical): No  Physical Activity: Inactive   Days of Exercise per Week: 0 days   Minutes of Exercise per Session: 0 min  Stress: No Stress Concern Present   Feeling of Stress : Not at all  Social Connections: Not on file   No Known Allergies  Medications  (Not in a hospital admission)    Vitals   Vitals:   10/19/20 1740 10/19/20 1745 10/19/20 1800 10/19/20 1834  BP: (!) 143/78 (!) 166/80 133/68 (!) 144/81  Pulse: 80 82 85 93  Resp: 17 17 18  (!) 24  Temp:      TempSrc:      SpO2: 98% 99% 100% 100%     There is no height or weight on file to calculate BMI.  Physical Exam   General: Laying comfortably in bed; in no acute distress.  HENT: Normal oropharynx and mucosa. Normal external appearance of ears and nose.  Neck: Supple, no pain or tenderness  CV: No JVD. No peripheral edema.  Pulmonary: Symmetric Chest rise. Normal respiratory effort.  Abdomen: Soft to touch, non-tender.  Ext: No cyanosis, edema, s/p L BKA. Skin: No rash. Normal palpation of skin.   Musculoskeletal: Normal digits and nails by inspection. No clubbing.   Neurologic Examination  Mental status/Cognition: Alert, oriented to self, place, month and year, good attention.  Speech/language: Fluent, comprehension intact, object naming intact, repetition intact.  Cranial nerves:   CN II Pupils equal and reactive to light, no VF deficits    CN III,IV,VI EOM intact, no gaze preference or deviation, no  nystagmus    CN V normal sensation in V1, V2, and V3 segments bilaterally    CN VII no asymmetry, no nasolabial fold flattening    CN VIII normal hearing to speech   CN IX & X normal palatal elevation, no uvular deviation    CN XI 5/5 head turn and 5/5 shoulder shrug bilaterally    CN XII midline tongue protrusion    Motor:  Muscle bulk: normal, tone increased with some cogwheeling. tremor hasa resting tremor with some parkinosnian features. Mvmt Root Nerve  Muscle Right Left Comments  SA C5/6 Ax Deltoid 5 5   EF C5/6 Mc Biceps 5 5   EE C6/7/8 Rad Triceps 5 5   WF C6/7 Med FCR     WE C7/8 PIN ECU     F Ab C8/T1 U ADM/FDI 5 5   HF L1/2/3 Fem Illopsoas 5 5 S/p L BKA  KE L2/3/4 Fem Quad 5    DF L4/5 D Peron Tib Ant 5    PF S1/2 Tibial Grc/Sol 5     Reflexes:  Right Left Comments  Pectoralis      Biceps (C5/6) 2 2   Brachioradialis (C5/6) 2 2    Triceps (C6/7) 2 2    Patellar (L3/4) 2 2    Achilles (S1)      Hoffman      Plantar     Jaw jerk    Sensation:  Light touch intact   Pin prick    Temperature    Vibration   Proprioception    Coordination/Complex Motor:  - Finger to Nose intact BL - Heel to shin unable to do 2/2 L BKA - Rapid alternating movement are slowed. - Gait: not sfe to assess given Left BKA.  Labs   CBC:  Recent Labs  Lab 10/19/20 1332  WBC 4.9  NEUTROABS 2.4  HGB 12.6*  HCT 38.4*  MCV 102.9*  PLT 606    Basic Metabolic Panel:  Lab Results  Component Value Date   NA 135 10/19/2020   K 3.9 10/19/2020   CO2 31 10/19/2020   GLUCOSE 112 (H) 10/19/2020   BUN 10 10/19/2020  CREATININE 3.50 (H) 10/19/2020   CALCIUM 8.8 (L) 10/19/2020   GFRNONAA 17 (L) 10/19/2020   GFRAA 7 (L) 12/03/2019   Lipid Panel:  Lab Results  Component Value Date   LDLCALC 103 09/11/2019   HgbA1c:  Lab Results  Component Value Date   HGBA1C 7.1 (H) 01/14/2020   Urine Drug Screen:     Component Value Date/Time   LABOPIA NONE DETECTED 08/13/2016 1300    COCAINSCRNUR NONE DETECTED 08/13/2016 1300   LABBENZ NONE DETECTED 08/13/2016 1300   AMPHETMU NONE DETECTED 08/13/2016 1300   THCU NONE DETECTED 08/13/2016 1300   LABBARB NONE DETECTED 08/13/2016 1300    Alcohol Level     Component Value Date/Time   [facility]  03/03/2008 0155    <5        LOWEST DETECTABLE LIMIT FOR SERUM ALCOHOL IS 5 mg/dL FOR MEDICAL PURPOSES ONLY    CT Head without contrast: Personally reviewed CT head without contrast with no large territory infarct and no hyperdensity concerning for an ICH.  Impression   TADAO EMIG is a 80 y.o. male with PMH significant for  cancer, colon polyps, DM2, HLD, ESRD on HD, HTN, GERD, left eye legally blind, galucoma, PAD, recent BKA who presents with tremors.  He has some resting parkinsonian tremors but addition also has intermittent violent tremor involving all arms and legs that lasts less than 10 seconds.  In addition, he does have bradykinesia, masklike facies and some cogwheeling concerning potentially for underlying parkinsonism.  Daughter at bedside reports that the tremor started after BKA and around the same time when his gabapentin was increased. Gabapentin typically causes myoclonus/asterixis. The tremors do not have any relationship to the dialysis and he has never had anything that is in the past.  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 - Start low dose Sinemet 25/100 TID. If this is not particularly helpful, I did discuss with daughter that we can stop it. - Follow up with Dr. Ellouise Newer with [facility]. - Given that the daughter is really worried, I will send a message to Dr. Delice Lesch to get him in early.  ______________________________________________________________________   Thank you for the opportunity to take part in the care of this patient. If you have any further questions, please contact the  neurology consultation attending.  Signed,  [place] Pager Number [phone] _ _ _   _ __   _ __ _ _  __ __   _ __   __ _

## 2020-10-19 NOTE — Discharge Instructions (Addendum)
Follow-up with neurology.  They should be calling for an appointment.  Take gabapentin 100 mg once a day, take Sinemet 3 times a day.

## 2020-10-19 NOTE — ED Provider Notes (Signed)
Emergency Medicine Provider Triage Evaluation Note  Elijah White , a 80 y.o. male  was evaluated in triage.  Pt complains of intermittent shaking.  Symptoms have been going on for several months.  Could not complete his dialysis session today because of it.  Denies any pain.  Completed dialysis for about 1 hour prior to being brought to the ER.  Review of Systems  Positive: Tremors Negative: Chest pain  Physical Exam  BP (!) 152/82   Pulse 96   Temp 97.6 F (36.4 C) (Oral)   Resp 20   SpO2 97%  Gen:   Awake, no distress   Resp:  Normal effort  MSK:   Moves extremities without difficulty  Other:  Tremors of bilateral upper extremities  Medical Decision Making  Medically screening exam initiated at 1:21 PM.  Appropriate orders placed.  Elijah White was informed that the remainder of the evaluation will be completed by another provider, this initial triage assessment does not replace that evaluation, and the importance of remaining in the ED until their evaluation is complete.  Labs ordered   Delia Heady, Hershal Coria 10/19/20 1321    Blanchie Dessert, MD 10/19/20 1400

## 2020-10-19 NOTE — ED Triage Notes (Addendum)
Pt bib ems from dialysis for intermittent shaking for the past few weeks, pt seen here recently for the same and has a follow up appt with neurology in a few weeks but pt states they have gotten worse and he could not wait. Pt denies any pain. Pt was only able to complete 1 hour of dialysis today. VSS. Pt live at [facility]

## 2020-10-19 NOTE — ED Notes (Addendum)
ED Provider at bedside. Provider notified of episode of bilateral hand tremors lasting approximately 30 seconds. Patient remained alert, oriented, talking to RN and tech at bedside until tremors subsided.

## 2020-10-19 NOTE — ED Provider Notes (Signed)
[facility] EMERGENCY DEPARTMENT Provider Note   CSN: [id number] Arrival date & time: 10/19/20  1314     History Chief Complaint  Patient presents with   Tremors    EPIFANIO LABRADOR is a 80 y.o. male.  The history is provided by the patient and a caregiver.  Illness Location:  Arms,hand Quality:  Tremors Severity:  Moderate Onset quality:  Gradual Duration:  2 months Timing:  Intermittent Progression:  Waxing and waning Chronicity:  Recurrent Context:  Patient with history of end-stage renal disease on hemodialysis with intermittent tremors to his hands and arms over the last several months.  No loss of consciousness.  No weakness.  Recent BKA. Relieved by:  Nothing Worsened by:  Nothing Associated symptoms: no abdominal pain, no chest pain, no cough, no ear pain, no fever, no headaches, no rash, no shortness of breath, no sore throat and no vomiting       Past Medical History:  Diagnosis Date   Anemia    low iron   Aortic stenosis    s/p TAVR 01/19/20   Arthritis    Cancer (HCC)    prostate, s/p I-125 seed implant 05/18/05   Colon polyps ~ 1993 and 2003   Dr Dorena Cookey, [facility].  08/2001 colonoscopy: tubular adenoma at cecum.     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, 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HCC) 08/12/2020   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   Acute on chronic diastolic heart failure (HCC) 01/19/2020   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   RBBB    Hypercalcemia 12/16/2019   Near syncope 12/13/2019   Moderate protein-calorie malnutrition (HCC) 12/04/2019   Severe aortic stenosis    ESRD (end stage renal disease) (HCC) 11/26/2019   Macrocytic anemia 11/26/2019   Adrenal mass (HCC) 11/26/2019   Colonic mass 11/26/2019   Allergy, unspecified, initial encounter 11/16/2019   Anaphylactic shock, unspecified, initial encounter 11/16/2019   Other long term (current) drug therapy 09/05/2019   Iron deficiency anemia, unspecified 07/13/2019   Encounter for immunization 04/17/2019   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   Dependence on renal dialysis (HCC) 04/16/2019   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 kidney disease, or unspecified chronic kidney disease 04/16/2019   Localized edema 04/16/2019   Malignant neoplasm of prostate (HCC) 04/16/2019   Nicotine dependence, unspecified, uncomplicated 04/16/2019   Other disorders of phosphorus metabolism 04/16/2019   Pain, unspecified 04/16/2019   Pruritus, unspecified 04/16/2019   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   Shortness of breath 04/16/2019   Unspecified glaucoma 04/16/2019   Unspecified urinary incontinence 04/16/2019   ARF (acute renal failure) (HCC) 03/25/2018   Vision loss of right eye 11/11/2017   Bilateral pseudophakia 07/11/2017   GERD (gastroesophageal reflux disease) 09/10/2016   HLD (hyperlipidemia) 09/10/2016   Essential hypertension 08/13/2016  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   Type 2 diabetes mellitus with renal manifestations ([place]) 10/22/2006   BPH (benign prostatic hyperplasia) 10/22/2006   COLONIC POLYPS, HX OF 10/22/2006    Past Surgical History:  Procedure Laterality Date   ABDOMINAL AORTOGRAM W/LOWER EXTREMITY N/A 07/25/2020   Procedure: ABDOMINAL AORTOGRAM W/LOWER EXTREMITY;  Surgeon: Marty Heck, MD;  Location: [place] [facility];  Service:  Cardiovascular;  Laterality: N/A;   AMPUTATION Left 08/12/2020   Procedure: LEFT BELOW KNEE AMPUTATION;  Surgeon: Marty Heck, MD;  Location: [place];  Service: Vascular;  Laterality: Left;   AV FISTULA PLACEMENT Left 03/20/2018   Procedure: ARTERIOVENOUS (AV) FISTULA CREATION ARM;  Surgeon: [name], MD;  Location: [place];  Service: Vascular;  Laterality: Left;   [place] Left 05/21/2018   Procedure: LEFT BASILIC VEIN FISTULA SECOND STAGE;  Surgeon: [name], MD;  Location: [place];  Service: Vascular;  Laterality: Left;   COLONOSCOPY     COLONOSCOPY WITH PROPOFOL N/A 11/30/2019   Procedure: COLONOSCOPY WITH PROPOFOL;  Surgeon: Ronnette Juniper, MD;  Location: [place];  Service: Gastroenterology;  Laterality: N/A;   GLAUCOMA SURGERY  2019   multiple surgeries   HEMOSTASIS CLIP PLACEMENT  11/30/2019   Procedure: HEMOSTASIS CLIP PLACEMENT;  Surgeon: Ronnette Juniper, MD;  Location: [facility];  Service: Gastroenterology;;   KNEE ARTHROSCOPY     MULTIPLE EXTRACTIONS WITH ALVEOLOPLASTY N/A 12/30/2019   Procedure: MULTIPLE EXTRACTION WITH ALVEOLOPLASTY;  Surgeon: Charlaine Dalton, DMD;  Location: [place];  Service: Dentistry;  Laterality: N/A;   PERIPHERAL VASCULAR INTERVENTION Left 07/25/2020   Procedure: PERIPHERAL VASCULAR INTERVENTION;  Surgeon: Marty Heck, MD;  Location: [place] [facility];  Service: Cardiovascular;  Laterality: Left;  superficial femoral   POLYPECTOMY  11/30/2019   Procedure: POLYPECTOMY;  Surgeon: Ronnette Juniper, MD;  Location: [place];  Service: Gastroenterology;;   PROSTATE SURGERY     RIGHT/LEFT HEART CATH AND CORONARY ANGIOGRAPHY N/A 12/03/2019   Procedure: RIGHT/LEFT HEART CATH AND CORONARY ANGIOGRAPHY;  Surgeon: Burnell Blanks, MD;  Location: [place] [facility];  Service: Cardiovascular;  Laterality: N/A;   SUBMUCOSAL TATTOO INJECTION  11/30/2019   Procedure: SUBMUCOSAL TATTOO INJECTION;  Surgeon:  Ronnette Juniper, MD;  Location: [place];  Service: Gastroenterology;;   TEE WITHOUT CARDIOVERSION N/A 01/19/2020   Procedure: TRANSESOPHAGEAL ECHOCARDIOGRAM (TEE);  Surgeon: Burnell Blanks, MD;  Location: [place] [facility];  Service: Open Heart Surgery;  Laterality: N/A;   TRANSCATHETER AORTIC VALVE REPLACEMENT, TRANSFEMORAL Left 01/19/2020   Procedure: TRANSCATHETER AORTIC VALVE REPLACEMENT, LEFT TRANSFEMORAL;  Surgeon: Burnell Blanks, MD;  Location: [place] [facility];  Service: Open Heart Surgery;  Laterality: Left;       Family History  Problem Relation Age of Onset   CAD Mother     Social History   Tobacco Use   Smoking status: Former    Packs/day: 0.50    Types: Cigarettes    Quit date: 03/23/2018    Years since quitting: 2.5   Smokeless tobacco: Never  Vaping Use   Vaping Use: Never used  Substance Use Topics   Alcohol use: No   Drug use: No    Home Medications Prior to Admission medications   Medication Sig Start Date End Date Taking? Authorizing Provider  carbidopa-levodopa (SINEMET) 25-100 MG tablet Take 1 tablet by mouth 3 (three) times daily. 10/19/20 11/18/20 Yes Laranda Burkemper, DO  gabapentin (NEURONTIN) 100 MG capsule Take 1 capsule (100 mg  total) by mouth daily. 10/19/20 11/18/20 Yes Caresse Sedivy, DO  acetaminophen (TYLENOL) 500 MG tablet Take 1,000 mg by mouth every 6 (six) hours as needed for moderate pain or headache.    [provider]  aspirin EC 81 MG tablet Take 81 mg by mouth in the morning. Swallow whole.    [provider]  atorvastatin (LIPITOR) 10 MG tablet Take 1 tablet (10 mg total) by mouth in the morning. 08/16/20   Rhyne, Hulen Shouts, PA-C  AURYXIA 1 GM 210 MG(Fe) tablet Take 420 mg by mouth 3 (three) times daily with meals. 05/30/20   [provider]  B Complex-C-Folic Acid (DIALYVITE 215) 0.8 MG TABS Take 1 tablet by mouth in the morning. 05/08/19   [provider]  blood glucose meter kit and  supplies KIT Dispense based on patient and insurance preference. Use up to four times daily as directed. (FOR ICD-9 250.00, 250.01). For QAC - HS accuchecks. 03/30/18   Thurnell Lose, MD  brimonidine (ALPHAGAN) 0.15 % ophthalmic solution Place 1 drop into the right eye 3 (three) times daily.    [provider]  carvedilol (COREG) 3.125 MG tablet Take 1 tablet (3.125 mg total) by mouth 2 (two) times daily with a meal. 08/16/20   Rhyne, Hulen Shouts, PA-C  Cinacalcet HCl (SENSIPAR PO) Take by mouth. 08/31/20 08/30/21  [provider]  clopidogrel (PLAVIX) 75 MG tablet Take 1 tablet (75 mg total) by mouth daily with breakfast. 07/25/20   Marty Heck, MD  dorzolamide-timolol (COSOPT) 22.3-6.8 MG/ML ophthalmic solution Place 1 drop into the right eye 2 (two) times daily. 06/25/17   [provider]  Doxercalciferol (HECTOROL IV) Doxercalciferol (Hectorol) 08/09/20 08/28/21  [provider]  folic acid-vitamin b complex-vitamin c-selenium-zinc (DIALYVITE) 3 MG TABS tablet Take 1 tablet by mouth daily.    [provider]  hydrocortisone cream 1 % Apply 1 application topically 3 (three) times daily as needed for itching.    [provider]  hydroxypropyl methylcellulose / hypromellose (ISOPTO TEARS / GONIOVISC) 2.5 % ophthalmic solution Place 1 drop into the left eye as needed for dry eyes.    [provider]  isosorbide mononitrate (IMDUR) 30 MG 24 hr tablet Take 1 tablet (30 mg total) by mouth in the morning. 08/16/20   Rhyne, Hulen Shouts, PA-C  Lancets (ONETOUCH DELICA PLUS [id number]) MISC  03/31/18   [provider]  Methoxy PEG-Epoetin Beta (MIRCERA IJ) Mircera 09/05/20 09/04/21  [provider]  ONETOUCH VERIO test strip USE AS DIRECTED TO TEST FOUR TIMES A DAY 07/31/18   [name], MD  oxyCODONE-acetaminophen (PERCOCET) 5-325 MG tablet Take 1 tablet by mouth every 6 (six) hours as needed (pain). 08/19/20   Baglia, Corrina,  PA-C  pantoprazole (PROTONIX) 40 MG tablet Take 1 tablet (40 mg total) by mouth daily at 12 noon. 08/16/20   Rhyne, Hulen Shouts, PA-C  pilocarpine (PILOCAR) 4 % ophthalmic solution Place 1 drop into the right eye 4 (four) times daily.     [provider]  ROCKLATAN 0.02-0.005 % SOLN Place 1 drop into the right eye at bedtime. 06/20/20   [provider]    Allergies    Patient has no known allergies.  Review of Systems   Review of Systems  Constitutional:  Negative for chills and fever.  HENT:  Negative for ear pain and sore throat.   Eyes:  Negative for pain and visual disturbance.  Respiratory:  Negative for cough and shortness  of breath.   Cardiovascular:  Negative for chest pain and palpitations.  Gastrointestinal:  Negative for abdominal pain and vomiting.  Genitourinary:  Negative for dysuria and hematuria.  Musculoskeletal:  Negative for arthralgias and back pain.  Skin:  Negative for color change and rash.  Neurological:  Positive for tremors. Negative for dizziness, seizures, syncope, facial asymmetry, speech difficulty, light-headedness, numbness and headaches.  All other systems reviewed and are negative.  Physical Exam Updated Vital Signs  ED Triage Vitals  Enc Vitals Group     BP 10/19/20 1317 (!) 152/82     Pulse Rate 10/19/20 1317 96     Resp 10/19/20 1317 20     Temp 10/19/20 1317 97.6 F (36.4 C)     Temp Source 10/19/20 1317 Oral     SpO2 10/19/20 1317 97 %     Weight --      Height --      Head Circumference --      Peak Flow --      Pain Score 10/19/20 1320 0     Pain Loc --      Pain Edu? --      Excl. in GC? --     Physical Exam Vitals and nursing note reviewed.  Constitutional:      General: He is not in acute distress.    Appearance: He is well-developed. He is not ill-appearing.  HENT:     Head: Normocephalic and atraumatic.     Nose: Nose normal.     Mouth/Throat:     Mouth: Mucous membranes are moist.  Eyes:      Extraocular Movements: Extraocular movements intact.     Conjunctiva/sclera: Conjunctivae normal.     Pupils: Pupils are equal, round, and reactive to light.  Cardiovascular:     Rate and Rhythm: Normal rate and regular rhythm.     Pulses: Normal pulses.     Heart sounds: Normal heart sounds. No murmur heard. Pulmonary:     Effort: Pulmonary effort is normal. No respiratory distress.     Breath sounds: Normal breath sounds.  Abdominal:     Palpations: Abdomen is soft.     Tenderness: There is no abdominal tenderness.  Musculoskeletal:     Cervical back: Normal range of motion and neck supple.  Skin:    General: Skin is warm and dry.     Capillary Refill: Capillary refill takes less than 2 seconds.  Neurological:     General: No focal deficit present.     Mental Status: He is alert.     Cranial Nerves: No cranial nerve deficit.     Sensory: No sensory deficit.     Motor: No weakness.     Coordination: Coordination normal.     Comments: Patient appears to have a resting tremor in both hands and then intermittently will have shaking of both upper extremities more pronounced, he is awake during these episodes    ED Results / Procedures / Treatments   Labs (all labs ordered are listed, but only abnormal results are displayed) Labs Reviewed  BASIC METABOLIC PANEL - Abnormal; Notable for the following components:      Result Value   Chloride 92 (*)    Glucose, Bld 112 (*)    Creatinine, Ser 3.50 (*)    Calcium 8.8 (*)    GFR, Estimated 17 (*)    All other components within normal limits  CBC WITH DIFFERENTIAL/PLATELET - Abnormal; Notable for the following components:  RBC 3.73 (*)    Hemoglobin 12.6 (*)    HCT 38.4 (*)    MCV 102.9 (*)    All other components within normal limits  AMMONIA - Abnormal; Notable for the following components:   Ammonia 38 (*)    All other components within normal limits  HEPATIC FUNCTION PANEL    EKG EKG  Interpretation  Date/Time:  Wednesday October 19 2020 17:37:35 EDT Ventricular Rate:  79 PR Interval:  139 QRS Duration: 119 QT Interval:  416 QTC Calculation: 477 R Axis:   81 Text Interpretation: Sinus rhythm Incomplete right bundle branch block Confirmed by Lennice Sites ([id number]) on 10/19/2020 6:13:21 PM  Radiology CT HEAD WO CONTRAST (5MM)  Result Date: 10/19/2020 CLINICAL DATA:  Tremors. EXAM: CT HEAD WITHOUT CONTRAST TECHNIQUE: Contiguous axial images were obtained from the base of the skull through the vertex without intravenous contrast. COMPARISON:  None. FINDINGS: Brain: Cerebral ventricle sizes are concordant with the degree of cerebral volume loss. Patchy and confluent areas of decreased attenuation are noted throughout the deep and periventricular white matter of the cerebral hemispheres bilaterally, compatible with chronic microvascular ischemic disease. No evidence of large-territorial acute infarction. No parenchymal hemorrhage. No mass lesion. No extra-axial collection. No mass effect or midline shift. No hydrocephalus. Basilar cisterns are patent. Vascular: No hyperdense vessel. Atherosclerotic calcifications are present within the cavernous internal carotid and vertebral arteries. Skull: No acute fracture or focal lesion. Sinuses/Orbits: Paranasal sinuses and mastoid air cells are clear. Bilateral lens replacement. Left orbit surgical changes likely related to glaucoma. Otherwise the orbits are unremarkable. Other: None. IMPRESSION: No acute intracranial abnormality. Electronically Signed   By: Iven Finn M.D.   On: 10/19/2020 20:59    Procedures Procedures   Medications Ordered in ED Medications  carbidopa-levodopa (SINEMET IR) 25-100 MG per tablet immediate release 1 tablet (has no administration in time range)    ED Course  I have reviewed the triage vital signs and the nursing notes.  Pertinent labs & imaging results that were available during my care of the patient  were reviewed by me and considered in my medical decision making (see chart for details).    MDM Rules/Calculators/A&P                           ANTONIA CULBERTSON is an 80 year old male who presents the ED with tremors.  Normal vitals.  No fever.  History of CKD on dialysis.  Recent BKA.  Has been having tremors in his upper extremities for the last several months.  Has neurology appointment in October and has been seen in the ED for the same.  Tremors happening more frequently and becoming more disruptive.  Was at dialysis today and had more frequent tremors and was sent here for evaluation.  Finished about 1 hour of dialysis.  He appears to have a resting tremor in both hands and then intermittently appears to have more dramatic shaking in both of his arms.  He is awake during these events.  He is overall neurologically intact.  Could asterixis or essential tremor or parkinsonian tremor. Less likely seizures.  Lab work thus far is overall unremarkable.  No significant electrolyte abnormality.  BUN is 10.  We will get hepatic function panel and ammonia level.  Talked with neurology he will come and evaluate.  Review of medications on the phone with neurology did not show any concerning medications that can cause this.  Overall suspect this  is a chronic process but given that there has been a delay for neurology work-up outpatient we will make sure no other emergent work-up needs to be done.  CT scan unremarkable.  Ammonia unremarkable.  Neurology evaluated the patient and concern for likely Parkinson's.  We will start patient on Sinemet.  We will decrease gabapentin dose to 100 mg daily.  We will help arrange close neurology follow-up.  Discharged in good condition.  This chart was dictated using voice recognition software.  Despite best efforts to proofread,  errors can occur which can change the documentation meaning.   Final Clinical Impression(s) / ED Diagnoses Final diagnoses:  Tremor    Rx /  DC Orders ED Discharge Orders          Ordered    Ambulatory referral to Neurology       Comments: An appointment is requested in approximately: 1 week   10/19/20 2259    gabapentin (NEURONTIN) 100 MG capsule  Daily        10/19/20 2301    carbidopa-levodopa (SINEMET) 25-100 MG tablet  3 times daily        10/19/20 2301             Lennice Sites, DO 10/19/20 2302

## 2020-10-20 DIAGNOSIS — R251 Tremor, unspecified: Secondary | ICD-10-CM | POA: Diagnosis not present

## 2020-10-20 DIAGNOSIS — T82858A Stenosis of vascular prosthetic devices, implants and grafts, initial encounter: Secondary | ICD-10-CM | POA: Diagnosis not present

## 2020-10-20 DIAGNOSIS — R279 Unspecified lack of coordination: Secondary | ICD-10-CM | POA: Diagnosis not present

## 2020-10-20 DIAGNOSIS — Z743 Need for continuous supervision: Secondary | ICD-10-CM | POA: Diagnosis not present

## 2020-10-20 DIAGNOSIS — Z7401 Bed confinement status: Secondary | ICD-10-CM | POA: Diagnosis not present

## 2020-10-20 DIAGNOSIS — Z4781 Encounter for orthopedic aftercare following surgical amputation: Secondary | ICD-10-CM | POA: Diagnosis not present

## 2020-10-20 DIAGNOSIS — M6281 Muscle weakness (generalized): Secondary | ICD-10-CM | POA: Diagnosis not present

## 2020-10-20 DIAGNOSIS — Z992 Dependence on renal dialysis: Secondary | ICD-10-CM | POA: Diagnosis not present

## 2020-10-20 DIAGNOSIS — R2681 Unsteadiness on feet: Secondary | ICD-10-CM | POA: Diagnosis not present

## 2020-10-20 DIAGNOSIS — I871 Compression of vein: Secondary | ICD-10-CM | POA: Diagnosis not present

## 2020-10-20 DIAGNOSIS — N186 End stage renal disease: Secondary | ICD-10-CM | POA: Diagnosis not present

## 2020-10-20 DIAGNOSIS — R1312 Dysphagia, oropharyngeal phase: Secondary | ICD-10-CM | POA: Diagnosis not present

## 2020-10-20 DIAGNOSIS — R6889 Other general symptoms and signs: Secondary | ICD-10-CM | POA: Diagnosis not present

## 2020-10-20 NOTE — ED Notes (Signed)
Pt transported to facility via [facility]. VSS. GCS 15.

## 2020-10-20 NOTE — ED Notes (Signed)
Pt to room waiting on [facility]. AxO x4. Denies needs.

## 2020-10-21 ENCOUNTER — Encounter: Payer: Self-pay | Admitting: Neurology

## 2020-10-21 DIAGNOSIS — N186 End stage renal disease: Secondary | ICD-10-CM | POA: Diagnosis not present

## 2020-10-21 DIAGNOSIS — N2581 Secondary hyperparathyroidism of renal origin: Secondary | ICD-10-CM | POA: Diagnosis not present

## 2020-10-21 DIAGNOSIS — Z992 Dependence on renal dialysis: Secondary | ICD-10-CM | POA: Diagnosis not present

## 2020-10-21 DIAGNOSIS — D509 Iron deficiency anemia, unspecified: Secondary | ICD-10-CM | POA: Diagnosis not present

## 2020-10-21 DIAGNOSIS — D631 Anemia in chronic kidney disease: Secondary | ICD-10-CM | POA: Diagnosis not present

## 2020-10-23 DIAGNOSIS — M6281 Muscle weakness (generalized): Secondary | ICD-10-CM | POA: Diagnosis not present

## 2020-10-23 DIAGNOSIS — R1312 Dysphagia, oropharyngeal phase: Secondary | ICD-10-CM | POA: Diagnosis not present

## 2020-10-23 DIAGNOSIS — Z4781 Encounter for orthopedic aftercare following surgical amputation: Secondary | ICD-10-CM | POA: Diagnosis not present

## 2020-10-23 DIAGNOSIS — R2681 Unsteadiness on feet: Secondary | ICD-10-CM | POA: Diagnosis not present

## 2020-10-23 DIAGNOSIS — R279 Unspecified lack of coordination: Secondary | ICD-10-CM | POA: Diagnosis not present

## 2020-10-24 DIAGNOSIS — D509 Iron deficiency anemia, unspecified: Secondary | ICD-10-CM | POA: Diagnosis not present

## 2020-10-24 DIAGNOSIS — Z4781 Encounter for orthopedic aftercare following surgical amputation: Secondary | ICD-10-CM | POA: Diagnosis not present

## 2020-10-24 DIAGNOSIS — D631 Anemia in chronic kidney disease: Secondary | ICD-10-CM | POA: Diagnosis not present

## 2020-10-24 DIAGNOSIS — R1312 Dysphagia, oropharyngeal phase: Secondary | ICD-10-CM | POA: Diagnosis not present

## 2020-10-24 DIAGNOSIS — N2581 Secondary hyperparathyroidism of renal origin: Secondary | ICD-10-CM | POA: Diagnosis not present

## 2020-10-24 DIAGNOSIS — Z992 Dependence on renal dialysis: Secondary | ICD-10-CM | POA: Diagnosis not present

## 2020-10-24 DIAGNOSIS — R2681 Unsteadiness on feet: Secondary | ICD-10-CM | POA: Diagnosis not present

## 2020-10-24 DIAGNOSIS — N186 End stage renal disease: Secondary | ICD-10-CM | POA: Diagnosis not present

## 2020-10-24 DIAGNOSIS — R279 Unspecified lack of coordination: Secondary | ICD-10-CM | POA: Diagnosis not present

## 2020-10-24 DIAGNOSIS — M6281 Muscle weakness (generalized): Secondary | ICD-10-CM | POA: Diagnosis not present

## 2020-10-26 DIAGNOSIS — R1312 Dysphagia, oropharyngeal phase: Secondary | ICD-10-CM | POA: Diagnosis not present

## 2020-10-26 DIAGNOSIS — D509 Iron deficiency anemia, unspecified: Secondary | ICD-10-CM | POA: Diagnosis not present

## 2020-10-26 DIAGNOSIS — D631 Anemia in chronic kidney disease: Secondary | ICD-10-CM | POA: Diagnosis not present

## 2020-10-26 DIAGNOSIS — N186 End stage renal disease: Secondary | ICD-10-CM | POA: Diagnosis not present

## 2020-10-26 DIAGNOSIS — R2681 Unsteadiness on feet: Secondary | ICD-10-CM | POA: Diagnosis not present

## 2020-10-26 DIAGNOSIS — Z4781 Encounter for orthopedic aftercare following surgical amputation: Secondary | ICD-10-CM | POA: Diagnosis not present

## 2020-10-26 DIAGNOSIS — M6281 Muscle weakness (generalized): Secondary | ICD-10-CM | POA: Diagnosis not present

## 2020-10-26 DIAGNOSIS — N2581 Secondary hyperparathyroidism of renal origin: Secondary | ICD-10-CM | POA: Diagnosis not present

## 2020-10-26 DIAGNOSIS — R279 Unspecified lack of coordination: Secondary | ICD-10-CM | POA: Diagnosis not present

## 2020-10-26 DIAGNOSIS — Z992 Dependence on renal dialysis: Secondary | ICD-10-CM | POA: Diagnosis not present

## 2020-10-27 DIAGNOSIS — Z0000 Encounter for general adult medical examination without abnormal findings: Secondary | ICD-10-CM | POA: Diagnosis not present

## 2020-10-27 DIAGNOSIS — Z139 Encounter for screening, unspecified: Secondary | ICD-10-CM | POA: Diagnosis not present

## 2020-10-27 DIAGNOSIS — H401133 Primary open-angle glaucoma, bilateral, severe stage: Secondary | ICD-10-CM | POA: Diagnosis not present

## 2020-10-28 DIAGNOSIS — D509 Iron deficiency anemia, unspecified: Secondary | ICD-10-CM | POA: Diagnosis not present

## 2020-10-28 DIAGNOSIS — N2581 Secondary hyperparathyroidism of renal origin: Secondary | ICD-10-CM | POA: Diagnosis not present

## 2020-10-28 DIAGNOSIS — D631 Anemia in chronic kidney disease: Secondary | ICD-10-CM | POA: Diagnosis not present

## 2020-10-28 DIAGNOSIS — N186 End stage renal disease: Secondary | ICD-10-CM | POA: Diagnosis not present

## 2020-10-28 DIAGNOSIS — Z992 Dependence on renal dialysis: Secondary | ICD-10-CM | POA: Diagnosis not present

## 2020-10-31 DIAGNOSIS — N2581 Secondary hyperparathyroidism of renal origin: Secondary | ICD-10-CM | POA: Diagnosis not present

## 2020-10-31 DIAGNOSIS — Z992 Dependence on renal dialysis: Secondary | ICD-10-CM | POA: Diagnosis not present

## 2020-10-31 DIAGNOSIS — N186 End stage renal disease: Secondary | ICD-10-CM | POA: Diagnosis not present

## 2020-10-31 DIAGNOSIS — D509 Iron deficiency anemia, unspecified: Secondary | ICD-10-CM | POA: Diagnosis not present

## 2020-10-31 DIAGNOSIS — D631 Anemia in chronic kidney disease: Secondary | ICD-10-CM | POA: Diagnosis not present

## 2020-11-01 ENCOUNTER — Other Ambulatory Visit: Payer: Self-pay | Admitting: Physician Assistant

## 2020-11-01 DIAGNOSIS — Z952 Presence of prosthetic heart valve: Secondary | ICD-10-CM

## 2020-11-02 DIAGNOSIS — N2581 Secondary hyperparathyroidism of renal origin: Secondary | ICD-10-CM | POA: Diagnosis not present

## 2020-11-02 DIAGNOSIS — N186 End stage renal disease: Secondary | ICD-10-CM | POA: Diagnosis not present

## 2020-11-02 DIAGNOSIS — Z992 Dependence on renal dialysis: Secondary | ICD-10-CM | POA: Diagnosis not present

## 2020-11-02 DIAGNOSIS — D509 Iron deficiency anemia, unspecified: Secondary | ICD-10-CM | POA: Diagnosis not present

## 2020-11-02 DIAGNOSIS — I129 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 kidney disease, or unspecified chronic kidney disease: Secondary | ICD-10-CM | POA: Diagnosis not present

## 2020-11-02 DIAGNOSIS — D631 Anemia in chronic kidney disease: Secondary | ICD-10-CM | POA: Diagnosis not present

## 2020-11-03 DIAGNOSIS — Z992 Dependence on renal dialysis: Secondary | ICD-10-CM | POA: Diagnosis not present

## 2020-11-03 DIAGNOSIS — I129 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 kidney disease, or unspecified chronic kidney disease: Secondary | ICD-10-CM | POA: Diagnosis not present

## 2020-11-03 DIAGNOSIS — N186 End stage renal disease: Secondary | ICD-10-CM | POA: Diagnosis not present

## 2020-11-04 DIAGNOSIS — N2581 Secondary hyperparathyroidism of renal origin: Secondary | ICD-10-CM | POA: Diagnosis not present

## 2020-11-04 DIAGNOSIS — Z992 Dependence on renal dialysis: Secondary | ICD-10-CM | POA: Diagnosis not present

## 2020-11-04 DIAGNOSIS — N186 End stage renal disease: Secondary | ICD-10-CM | POA: Diagnosis not present

## 2020-11-04 DIAGNOSIS — D509 Iron deficiency anemia, unspecified: Secondary | ICD-10-CM | POA: Diagnosis not present

## 2020-11-04 DIAGNOSIS — D631 Anemia in chronic kidney disease: Secondary | ICD-10-CM | POA: Diagnosis not present

## 2020-11-07 DIAGNOSIS — N2581 Secondary hyperparathyroidism of renal origin: Secondary | ICD-10-CM | POA: Diagnosis not present

## 2020-11-07 DIAGNOSIS — D631 Anemia in chronic kidney disease: Secondary | ICD-10-CM | POA: Diagnosis not present

## 2020-11-07 DIAGNOSIS — Z992 Dependence on renal dialysis: Secondary | ICD-10-CM | POA: Diagnosis not present

## 2020-11-07 DIAGNOSIS — N186 End stage renal disease: Secondary | ICD-10-CM | POA: Diagnosis not present

## 2020-11-07 DIAGNOSIS — D509 Iron deficiency anemia, unspecified: Secondary | ICD-10-CM | POA: Diagnosis not present

## 2020-11-08 NOTE — Progress Notes (Signed)
Assessment/Plan:   1.  Mild idiopathic Parkinson's disease  -We discussed the diagnosis as well as pathophysiology of the disease.  We discussed treatment options as well as prognostic indicators.  Patient education was provided.  -We discussed that it used to be thought that levodopa would increase risk of melanoma but now it is believed that Parkinsons itself likely increases risk of melanoma. he is to get regular skin checks.  -We decided to continue his levodopa, but move the timing from 10 AM/4 PM/10 PM to 8 AM/noon/4 PM.  Risks, benefits, side effects and alternative therapies were discussed.  The opportunity to ask questions was given and they were answered to the best of my ability.  The patient expressed understanding and willingness to follow the outlined treatment protocols.  2.  Tremulous episodes  -These were separate from his tremor associated with Parkinson's disease.  His daughter brought a few videos, but it was very difficult to see.  They certainly do not appear myoclonic and do not appear to be tremor associated with Parkinson's disease.  They did not appear to be seizure.  Nonetheless, since being started on levodopa and reducing gabapentin, he has had no further episodes.  3.  Status post left BKA  -Following the BKA, he began to have the tremulous episodes and, according to the daughter, seemed to have some right-sided neglect.  That has completely resolved.  He had a CT brain that was unremarkable.  He did not have an MRI of the brain, but we decided to hold off on that as I am not sure it would show anything now, and I am not sure it would change our treatment.  Daughter agrees.  4.  Renal failure  -Patient on dialysis.  5.  Gave patient the option of following up here or just following up with primary physician at skilled nursing facility, since it is difficult for him to get here.  He would like to follow-up here every 6 months.   Subjective:   Elijah White  was seen today in the movement disorders clinic for neurologic consultation at the request of Virgina Norfolk, DO.  The consultation is for the evaluation of tremor.  Hospital records are reviewed.  Patient saw neurology in the hospital.  Patient taken to the hospital because of "violent tremor involving all arms and legs that last less than seconds."  Started after his BKA and around the time that gabapentin was increased.  Hospital recommendations were to decrease the gabapentin, although it was recognized that gabapentin generally cause myoclonic movements.  Hospital neurologist was concerned that besides for the violent tremor episodes, he had "bradykinesia, masklike facies and some cogwheeling" and was concerned for possible Parkinson's.  He was started on carbidopa/levodopa 25/100, 1 tablet 3 times per day.  This was August 17.  He is sent here for further evaluation.  Daughter with him.  States that he is at [facility].  He takes that at 10/4pm/10pm  States that before the amputation he walked but he hasn't walked since, even with walker.     Specific Symptoms:  Tremor: Yes.  , mostly after the amputation and mostly the left hand.  States occasionally in the R hand but not as bad.  Daughter states the L hand tremor started did start about a year ago.  Daughter states that there was a separate shaking spell other than the tremor that she called a "shiver."  It was about 15 second spell.  It did NOT  correlate to gabapentin change (even though records state it did).  Daughter states that it did correlate with post BKA. It has not re-curred since starting levodopa, however (that was also the time that gabapentin was decreased).  Separately, daughter states that post BKA, pt had trouble with feeding (finger to mouth, trouble with feeding and trouble putting on his glasses- ? If only R hand).  That is better and completely resolved, but she brings in a video and reports that he seemed to have some right-sided neglect  after that surgery.   Family hx of similar:  No. Voice: little weaker per pt Sleep:   Vivid Dreams:  Yes.    Acting out dreams:  No. Wet Pillows: No. Bradykinesia symptoms: no longer ambulating Loss of smell:  No. Loss of taste:  No. Urinary Incontinence:  Yes.   - doesn't produce much urine - dialysis dependent Difficulty Swallowing:  Yes.  , some trouble with pills/solids Trouble with ADL's:  Yes.    Trouble buttoning clothing: No. Memory changes:  Yes.  , trouble with names; some day/night reversal Hallucinations:  No., but did have it post op Lightheaded:  No.  Syncope: No.   CT brain was personally reviewed from October 19, 2020.  There is at least moderate atrophy.  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    ALLERGIES:  No Known Allergies  CURRENT MEDICATIONS:  Current Outpatient Medications  Medication Instructions   acetaminophen (TYLENOL) 1,000 mg, Oral, Every 6 hours PRN   aspirin EC 81 mg, Oral, Every morning, Swallow whole.   atorvastatin (LIPITOR) 10 mg, Oral, Every morning   Auryxia 420 mg, Oral, 3 times daily with meals   B Complex-C-Folic Acid (DIALYVITE 948) 0.8 MG TABS 1 tablet, Oral, Every morning   blood glucose meter kit and supplies KIT Dispense based on patient and insurance preference. Use up to four times daily as directed. (FOR ICD-9 250.00, 250.01). For QAC - HS accuchecks.   brimonidine (ALPHAGAN) 0.15 % ophthalmic solution 1 drop, Right Eye, 3 times daily   carbidopa-levodopa (SINEMET) 25-100 MG tablet 1 tablet, Oral, 3 times daily   carvedilol (COREG) 3.125 mg, Oral, 2 times daily with meals   Cinacalcet HCl (SENSIPAR PO) Oral   clopidogrel (PLAVIX) 75 mg, Oral, Daily with breakfast   dorzolamide-timolol (COSOPT) 22.3-6.8 MG/ML ophthalmic solution 1 drop, Right Eye, 2 times daily   Doxercalciferol (HECTOROL IV) Doxercalciferol (Hectorol)   folic acid-vitamin b complex-vitamin c-selenium-zinc (DIALYVITE) 3 MG TABS tablet 1  tablet, Oral, Daily   gabapentin (NEURONTIN) 100 mg, Oral, Daily   hydrocortisone cream 1 % 1 application, Topical, 3 times daily PRN   hydroxypropyl methylcellulose / hypromellose (ISOPTO TEARS / GONIOVISC) 2.5 % ophthalmic solution 1 drop, Left Eye, As needed   isosorbide mononitrate (IMDUR) 30 mg, Oral, Every morning   Lancets (ONETOUCH DELICA PLUS [id number]) MISC    Methoxy PEG-Epoetin Beta (MIRCERA IJ) Mircera   ONETOUCH VERIO test strip USE AS DIRECTED TO TEST FOUR TIMES A DAY   oxyCODONE-acetaminophen (PERCOCET) 5-325 MG tablet 1 tablet, Oral, Every 6 hours PRN   pantoprazole (PROTONIX) 40 mg, Oral, Daily   pilocarpine (PILOCAR) 4 % ophthalmic solution 1 drop, Right Eye, 4 times daily   ROCKLATAN 0.02-0.005 % SOLN 1 drop, Right Eye, Daily at bedtime    Objective:   VITALS:   Vitals:   11/10/20 0950  BP: (!) 119/59  Pulse: 62  SpO2: 98%  Weight: 150 lb (68 kg)  Height:  $'5\' 5"'h$  (1.651 m)    GEN:  The patient appears stated age and is in NAD. HEENT:  Normocephalic, atraumatic.  The mucous membranes are moist. The superficial temporal arteries are without ropiness or tenderness. CV:  RRR Lungs:  CTAB Neck/HEME:  There are no carotid bruits bilaterally.  Neurological examination:  Orientation: The patient is alert and oriented x3.  Cranial nerves: There is good facial symmetry.  There is facial hypomimia, with marked decreased blink.  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  The speech is fluent and clear. Soft palate rises symmetrically and there is no tongue deviation. Hearing is intact to conversational tone. Sensation: Sensation is intact to light and pinprick throughout (facial, trunk, extremities). There is no extinction with double simultaneous stimulation. There is no sensory dermatomal level identified. Motor: Strength is 5/5 in the bilateral upper extremities and at least antigravity in the lower 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left BKA).   Shoulder shrug  is equal and symmetric.  There is no pronator drift. Deep tendon reflexes: Deep tendon reflexes are 0/4 at the bilateral biceps, triceps, brachioradialis, patella  Movement examination: Tone: There is normal tone in the bilateral upper extremities.  The tone in the lower extremities is normal.  Abnormal movements: There is left upper extremity rest tremor, mild, that increases with distraction Coordination:  There is no significant decremation with RAM's, with any form of RAMS, including alternating supination and pronation of the forearm, hand opening and closing, finger taps bilaterally or heel taps on the right. Gait and Station: The patient does not ambulate I have reviewed and interpreted the following labs independently   Chemistry      Component Value Date/Time   NA 135 10/19/2020 1332   NA 134 (A) 09/11/2019 0000   K 3.9 10/19/2020 1332   CL 92 (L) 10/19/2020 1332   CO2 31 10/19/2020 1332   BUN 10 10/19/2020 1332   BUN 34 (A) 09/11/2019 0000   CREATININE 3.50 (H) 10/19/2020 1332   GLU 228 09/11/2019 0000      Component Value Date/Time   CALCIUM 8.8 (L) 10/19/2020 1332   ALKPHOS 73 10/19/2020 1356   AST 24 10/19/2020 1356   ALT 16 10/19/2020 1356   BILITOT 0.6 10/19/2020 1356   BILITOT 0.5 02/19/2018 1014      Lab Results  Component Value Date   TSH 0.820 11/26/2019   Lab Results  Component Value Date   WBC 4.9 10/19/2020   HGB 12.6 (L) 10/19/2020   HCT 38.4 (L) 10/19/2020   MCV 102.9 (H) 10/19/2020   PLT 224 10/19/2020     Total time spent on today's visit was 62 minutes, including both face-to-face time and nonface-to-face time.  Time included that spent on review of records (prior notes available to me/labs/imaging if pertinent), discussing treatment and goals, answering patient's questions and coordinating care.  Cc:  [name], MD

## 2020-11-09 DIAGNOSIS — D509 Iron deficiency anemia, unspecified: Secondary | ICD-10-CM | POA: Diagnosis not present

## 2020-11-09 DIAGNOSIS — N186 End stage renal disease: Secondary | ICD-10-CM | POA: Diagnosis not present

## 2020-11-09 DIAGNOSIS — Z992 Dependence on renal dialysis: Secondary | ICD-10-CM | POA: Diagnosis not present

## 2020-11-09 DIAGNOSIS — D631 Anemia in chronic kidney disease: Secondary | ICD-10-CM | POA: Diagnosis not present

## 2020-11-09 DIAGNOSIS — N2581 Secondary hyperparathyroidism of renal origin: Secondary | ICD-10-CM | POA: Diagnosis not present

## 2020-11-10 ENCOUNTER — Encounter: Payer: Self-pay | Admitting: Neurology

## 2020-11-10 ENCOUNTER — Other Ambulatory Visit: Payer: Self-pay

## 2020-11-10 ENCOUNTER — Ambulatory Visit (INDEPENDENT_AMBULATORY_CARE_PROVIDER_SITE_OTHER): Payer: Medicare Other | Admitting: Neurology

## 2020-11-10 VITALS — BP 119/59 | HR 62 | Ht 65.0 in | Wt 150.0 lb

## 2020-11-10 DIAGNOSIS — G20 Parkinson's disease: Secondary | ICD-10-CM | POA: Diagnosis not present

## 2020-11-10 DIAGNOSIS — E1129 Type 2 diabetes mellitus with other diabetic kidney complication: Secondary | ICD-10-CM | POA: Diagnosis not present

## 2020-11-10 DIAGNOSIS — E785 Hyperlipidemia, unspecified: Secondary | ICD-10-CM | POA: Diagnosis not present

## 2020-11-10 DIAGNOSIS — E1169 Type 2 diabetes mellitus with other specified complication: Secondary | ICD-10-CM | POA: Diagnosis not present

## 2020-11-10 DIAGNOSIS — I120 Hypertensive chronic kidney disease with stage 5 chronic kidney disease or end stage renal disease: Secondary | ICD-10-CM | POA: Diagnosis not present

## 2020-11-10 NOTE — Patient Instructions (Addendum)
Change timing of Carbidopa-Levodopa  to   1 tablet @ 8 am / 12pm / and 4 pm Try to avoid protein within 30 min of Levodopa

## 2020-11-11 DIAGNOSIS — N186 End stage renal disease: Secondary | ICD-10-CM | POA: Diagnosis not present

## 2020-11-11 DIAGNOSIS — N2581 Secondary hyperparathyroidism of renal origin: Secondary | ICD-10-CM | POA: Diagnosis not present

## 2020-11-11 DIAGNOSIS — Z992 Dependence on renal dialysis: Secondary | ICD-10-CM | POA: Diagnosis not present

## 2020-11-11 DIAGNOSIS — D649 Anemia, unspecified: Secondary | ICD-10-CM | POA: Diagnosis not present

## 2020-11-11 DIAGNOSIS — D509 Iron deficiency anemia, unspecified: Secondary | ICD-10-CM | POA: Diagnosis not present

## 2020-11-11 DIAGNOSIS — E119 Type 2 diabetes mellitus without complications: Secondary | ICD-10-CM | POA: Diagnosis not present

## 2020-11-11 DIAGNOSIS — D631 Anemia in chronic kidney disease: Secondary | ICD-10-CM | POA: Diagnosis not present

## 2020-11-14 DIAGNOSIS — D509 Iron deficiency anemia, unspecified: Secondary | ICD-10-CM | POA: Diagnosis not present

## 2020-11-14 DIAGNOSIS — D631 Anemia in chronic kidney disease: Secondary | ICD-10-CM | POA: Diagnosis not present

## 2020-11-14 DIAGNOSIS — N186 End stage renal disease: Secondary | ICD-10-CM | POA: Diagnosis not present

## 2020-11-14 DIAGNOSIS — N2581 Secondary hyperparathyroidism of renal origin: Secondary | ICD-10-CM | POA: Diagnosis not present

## 2020-11-14 DIAGNOSIS — Z992 Dependence on renal dialysis: Secondary | ICD-10-CM | POA: Diagnosis not present

## 2020-11-16 DIAGNOSIS — Z992 Dependence on renal dialysis: Secondary | ICD-10-CM | POA: Diagnosis not present

## 2020-11-16 DIAGNOSIS — N2581 Secondary hyperparathyroidism of renal origin: Secondary | ICD-10-CM | POA: Diagnosis not present

## 2020-11-16 DIAGNOSIS — D631 Anemia in chronic kidney disease: Secondary | ICD-10-CM | POA: Diagnosis not present

## 2020-11-16 DIAGNOSIS — N186 End stage renal disease: Secondary | ICD-10-CM | POA: Diagnosis not present

## 2020-11-16 DIAGNOSIS — D509 Iron deficiency anemia, unspecified: Secondary | ICD-10-CM | POA: Diagnosis not present

## 2020-11-18 DIAGNOSIS — N186 End stage renal disease: Secondary | ICD-10-CM | POA: Diagnosis not present

## 2020-11-18 DIAGNOSIS — Z992 Dependence on renal dialysis: Secondary | ICD-10-CM | POA: Diagnosis not present

## 2020-11-18 DIAGNOSIS — D509 Iron deficiency anemia, unspecified: Secondary | ICD-10-CM | POA: Diagnosis not present

## 2020-11-18 DIAGNOSIS — D631 Anemia in chronic kidney disease: Secondary | ICD-10-CM | POA: Diagnosis not present

## 2020-11-18 DIAGNOSIS — N2581 Secondary hyperparathyroidism of renal origin: Secondary | ICD-10-CM | POA: Diagnosis not present

## 2020-11-21 DIAGNOSIS — D509 Iron deficiency anemia, unspecified: Secondary | ICD-10-CM | POA: Diagnosis not present

## 2020-11-21 DIAGNOSIS — N186 End stage renal disease: Secondary | ICD-10-CM | POA: Diagnosis not present

## 2020-11-21 DIAGNOSIS — Z992 Dependence on renal dialysis: Secondary | ICD-10-CM | POA: Diagnosis not present

## 2020-11-21 DIAGNOSIS — D631 Anemia in chronic kidney disease: Secondary | ICD-10-CM | POA: Diagnosis not present

## 2020-11-21 DIAGNOSIS — N2581 Secondary hyperparathyroidism of renal origin: Secondary | ICD-10-CM | POA: Diagnosis not present

## 2020-11-23 DIAGNOSIS — D631 Anemia in chronic kidney disease: Secondary | ICD-10-CM | POA: Diagnosis not present

## 2020-11-23 DIAGNOSIS — N2581 Secondary hyperparathyroidism of renal origin: Secondary | ICD-10-CM | POA: Diagnosis not present

## 2020-11-23 DIAGNOSIS — D509 Iron deficiency anemia, unspecified: Secondary | ICD-10-CM | POA: Diagnosis not present

## 2020-11-23 DIAGNOSIS — Z992 Dependence on renal dialysis: Secondary | ICD-10-CM | POA: Diagnosis not present

## 2020-11-23 DIAGNOSIS — N186 End stage renal disease: Secondary | ICD-10-CM | POA: Diagnosis not present

## 2020-11-24 DIAGNOSIS — L918 Other hypertrophic disorders of the skin: Secondary | ICD-10-CM | POA: Diagnosis not present

## 2020-11-25 DIAGNOSIS — D631 Anemia in chronic kidney disease: Secondary | ICD-10-CM | POA: Diagnosis not present

## 2020-11-25 DIAGNOSIS — N186 End stage renal disease: Secondary | ICD-10-CM | POA: Diagnosis not present

## 2020-11-25 DIAGNOSIS — Z992 Dependence on renal dialysis: Secondary | ICD-10-CM | POA: Diagnosis not present

## 2020-11-25 DIAGNOSIS — D509 Iron deficiency anemia, unspecified: Secondary | ICD-10-CM | POA: Diagnosis not present

## 2020-11-25 DIAGNOSIS — N2581 Secondary hyperparathyroidism of renal origin: Secondary | ICD-10-CM | POA: Diagnosis not present

## 2020-11-28 ENCOUNTER — Ambulatory Visit: Payer: Medicare Other | Admitting: Cardiovascular Disease

## 2020-11-28 DIAGNOSIS — N2581 Secondary hyperparathyroidism of renal origin: Secondary | ICD-10-CM | POA: Diagnosis not present

## 2020-11-28 DIAGNOSIS — Z992 Dependence on renal dialysis: Secondary | ICD-10-CM | POA: Diagnosis not present

## 2020-11-28 DIAGNOSIS — D509 Iron deficiency anemia, unspecified: Secondary | ICD-10-CM | POA: Diagnosis not present

## 2020-11-28 DIAGNOSIS — D631 Anemia in chronic kidney disease: Secondary | ICD-10-CM | POA: Diagnosis not present

## 2020-11-28 DIAGNOSIS — N186 End stage renal disease: Secondary | ICD-10-CM | POA: Diagnosis not present

## 2020-11-29 DIAGNOSIS — L918 Other hypertrophic disorders of the skin: Secondary | ICD-10-CM | POA: Diagnosis not present

## 2020-11-30 DIAGNOSIS — Z992 Dependence on renal dialysis: Secondary | ICD-10-CM | POA: Diagnosis not present

## 2020-11-30 DIAGNOSIS — D631 Anemia in chronic kidney disease: Secondary | ICD-10-CM | POA: Diagnosis not present

## 2020-11-30 DIAGNOSIS — N2581 Secondary hyperparathyroidism of renal origin: Secondary | ICD-10-CM | POA: Diagnosis not present

## 2020-11-30 DIAGNOSIS — D509 Iron deficiency anemia, unspecified: Secondary | ICD-10-CM | POA: Diagnosis not present

## 2020-11-30 DIAGNOSIS — N186 End stage renal disease: Secondary | ICD-10-CM | POA: Diagnosis not present

## 2020-12-02 DIAGNOSIS — Z992 Dependence on renal dialysis: Secondary | ICD-10-CM | POA: Diagnosis not present

## 2020-12-02 DIAGNOSIS — N186 End stage renal disease: Secondary | ICD-10-CM | POA: Diagnosis not present

## 2020-12-02 DIAGNOSIS — D631 Anemia in chronic kidney disease: Secondary | ICD-10-CM | POA: Diagnosis not present

## 2020-12-02 DIAGNOSIS — D509 Iron deficiency anemia, unspecified: Secondary | ICD-10-CM | POA: Diagnosis not present

## 2020-12-02 DIAGNOSIS — N2581 Secondary hyperparathyroidism of renal origin: Secondary | ICD-10-CM | POA: Diagnosis not present

## 2020-12-13 ENCOUNTER — Ambulatory Visit: Payer: Medicare Other | Admitting: Neurology

## 2021-01-03 ENCOUNTER — Ambulatory Visit: Payer: Medicare Other | Admitting: Cardiovascular Disease

## 2021-01-05 ENCOUNTER — Ambulatory Visit (HOSPITAL_COMMUNITY): Payer: Medicare Other | Attending: Cardiovascular Disease

## 2021-01-05 ENCOUNTER — Other Ambulatory Visit: Payer: Self-pay

## 2021-01-05 ENCOUNTER — Ambulatory Visit (INDEPENDENT_AMBULATORY_CARE_PROVIDER_SITE_OTHER): Payer: Medicare Other | Admitting: Physician Assistant

## 2021-01-05 ENCOUNTER — Encounter: Payer: Self-pay | Admitting: Physician Assistant

## 2021-01-05 ENCOUNTER — Other Ambulatory Visit (HOSPITAL_COMMUNITY): Payer: Medicare Other

## 2021-01-05 VITALS — BP 92/58 | HR 77 | Ht 65.0 in | Wt 143.0 lb

## 2021-01-05 DIAGNOSIS — N186 End stage renal disease: Secondary | ICD-10-CM

## 2021-01-05 DIAGNOSIS — R911 Solitary pulmonary nodule: Secondary | ICD-10-CM | POA: Diagnosis not present

## 2021-01-05 DIAGNOSIS — Z952 Presence of prosthetic heart valve: Secondary | ICD-10-CM | POA: Insufficient documentation

## 2021-01-05 DIAGNOSIS — I739 Peripheral vascular disease, unspecified: Secondary | ICD-10-CM | POA: Diagnosis not present

## 2021-01-05 LAB — ECHOCARDIOGRAM COMPLETE
AV Mean grad: 6.3 mmHg
AV Peak grad: 11.3 mmHg
Ao pk vel: 1.68 m/s
Area-P 1/2: 2.34 cm2
S' Lateral: 2.3 cm

## 2021-01-05 NOTE — Progress Notes (Signed)
HEART AND Elijah White                                       Cardiology Office Note    Date:  01/06/2021  ID:  JEARLD HEMP, DOB [birth year], MRN [id number]  PCP:  [name], MD  Cardiologist:  Evalina Field, MD / Dr. Buena Irish & Dr. Cyndia Bent (TAVR)   CC: 1 month s/p TAVR  History of Present Illness:  Elijah White is a 80 y.o. 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   He was placed on HD in 04/2019. He is followed by Dr. [name]. He was admitted to the hospital in 09/2017 for acute on chronic diastolic heart failure, but has never been followed by cardiology.   He was in his usual state of heath until 11/26/19 when he presented to [facility] via EMS with chest pain/epigastric pain and dypsnea while en route to hemodialysis. Upon arrival to ED: Patient's vital signs stable, afebrile, no leukocytosis, maintaining oxygen saturation on room air, HS troponin 36--> 37, EKG: sinus with RBBB and no acute changes, COVID-19 negative.  CT head negative for acute findings, lipase: WNL. Chest x-ray showed pulmonary vascular congestion without pulmonary edema and mild cardiomegaly. CTA chest/abd/pelvis: Negative for PE or aortic dissection. 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 A murmur was noted on exam prompting cardiology consultation and echo. He was cleared for colonoscopy which showed a polyp without evidence of malignancy. Echocardiogram showed preserved LV function with moderate to severe AS: V-max of 3.7 m/s, mean gradient 26 mmHg, and aortic valve area ~1.0 cm. Dimensionless index  around 0.31. Stroke-volume index reported at 44 cc/m.  He was being followed by Dr. Audie Box who felt his cardiac exam revealed a late peaking systoloic murmur more c/w severe AS. Given symptoms of chest pain and exertional dyspnea structural heart evaluation with cardiac CT scanning was requested for further work up. CT scans were c/w severe AS and plans were made for TAVR after dental extractions.    The patient was evaluated by the multidisciplinary valve team and underwent successful TAVR with a 26 mm Edwards Sapien 3 Ultra THV via the TF approach on 01/19/20. Post operative echo showed EF 60%, normally functioning TAVR with a mean gradient of 4 mmHg and no PVL. He was started on Plavix 80m daily to be continued for 6 months. He was discharged with a Zio AT given underlying RBBB. He has had a big clinical improvement since TAVR. 1 month echo showed EF 60%, normally functioning TAVR with a mean gradient of 8 mm hg and no PVL.   He underwent peripheral arteriogram in May with intervention including stent and angioplasy of left SFA. 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   Today he presents to clinic for follow up. Here with son. [place] with no complaints. BP was been running lower  recently but he is asymptomatic. Sometimes has GI upset. No CP or SOB. No LE edema, orthopnea or PND. No dizziness or syncope. No blood in stool or urine. No palpitations.    Past Medical History:  Diagnosis Date   Anemia    low iron   Aortic stenosis    s/p TAVR 01/19/20   Arthritis    Cancer [facility])    prostate, s/p I-125 seed implant 05/18/05   Colon polyps ~ 1993 and 2003   Dr [name], [facility].  08/2001 colonoscopy: tubular adenoma at cecum.     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   Elevated cholesterol with high triglycerides    ESRD  (end stage renal disease) (HCC)    [facility]   GERD (gastroesophageal reflux disease)    Glaucoma    Hypertension    Legally blind in left eye, as defined in [place]    has pinpoint vision in right eye   PAD (peripheral artery disease) ([place])    RBBB     Past Surgical History:  Procedure Laterality Date   ABDOMINAL AORTOGRAM W/LOWER EXTREMITY N/A 07/25/2020   Procedure: ABDOMINAL AORTOGRAM W/LOWER EXTREMITY;  Surgeon: Marty Heck, MD;  Location: [place] [facility];  Service: Cardiovascular;  Laterality: N/A;   AMPUTATION Left 08/12/2020   Procedure: LEFT BELOW KNEE AMPUTATION;  Surgeon: Marty Heck, MD;  Location: [place];  Service: Vascular;  Laterality: Left;   AV FISTULA PLACEMENT Left 03/20/2018   Procedure: ARTERIOVENOUS (AV) FISTULA CREATION ARM;  Surgeon: [name], MD;  Location: [place];  Service: Vascular;  Laterality: Left;   [place] Left 05/21/2018   Procedure: LEFT BASILIC VEIN FISTULA SECOND STAGE;  Surgeon: [name], MD;  Location: [place];  Service: Vascular;  Laterality: Left;   COLONOSCOPY     COLONOSCOPY WITH PROPOFOL N/A 11/30/2019   Procedure: COLONOSCOPY WITH PROPOFOL;  Surgeon: Ronnette Juniper, MD;  Location: [place];  Service: Gastroenterology;  Laterality: N/A;   GLAUCOMA SURGERY  2019   multiple surgeries   HEMOSTASIS CLIP PLACEMENT  11/30/2019   Procedure: HEMOSTASIS CLIP PLACEMENT;  Surgeon: Ronnette Juniper, MD;  Location: [facility] ENDOSCOPY;  Service: Gastroenterology;;   KNEE ARTHROSCOPY     MULTIPLE EXTRACTIONS WITH ALVEOLOPLASTY N/A 12/30/2019   Procedure: MULTIPLE EXTRACTION WITH ALVEOLOPLASTY;  Surgeon: Charlaine Dalton, DMD;  Location: [place];  Service: Dentistry;  Laterality: N/A;   PERIPHERAL VASCULAR INTERVENTION Left 07/25/2020   Procedure: PERIPHERAL VASCULAR INTERVENTION;  Surgeon: Marty Heck, MD;  Location: [place] [facility];  Service: Cardiovascular;  Laterality: Left;   superficial femoral   POLYPECTOMY  11/30/2019   Procedure: POLYPECTOMY;  Surgeon: Ronnette Juniper, MD;  Location: [place];  Service: Gastroenterology;;   PROSTATE SURGERY     RIGHT/LEFT HEART CATH AND CORONARY ANGIOGRAPHY N/A 12/03/2019   Procedure: RIGHT/LEFT HEART CATH AND CORONARY ANGIOGRAPHY;  Surgeon: Burnell Blanks, MD;  Location: [place] [facility];  Service: Cardiovascular;  Laterality: N/A;   SUBMUCOSAL TATTOO INJECTION  11/30/2019   Procedure: SUBMUCOSAL TATTOO INJECTION;  Surgeon: Ronnette Juniper, MD;  Location: [place];  Service: Gastroenterology;;   TEE WITHOUT CARDIOVERSION N/A 01/19/2020   Procedure: TRANSESOPHAGEAL ECHOCARDIOGRAM (TEE);  Surgeon: Burnell Blanks, MD;  Location: [place] [facility];  Service: Open Heart Surgery;  Laterality: N/A;   TRANSCATHETER AORTIC VALVE REPLACEMENT, TRANSFEMORAL Left 01/19/2020   Procedure: TRANSCATHETER AORTIC VALVE REPLACEMENT, LEFT TRANSFEMORAL;  Surgeon:  Burnell Blanks, MD;  Location: [place] [facility];  Service: Open Heart Surgery;  Laterality: Left;    Current Medications: Outpatient Medications Prior to Visit  Medication Sig Dispense Refill   acetaminophen (TYLENOL) 500 MG tablet Take 1,000 mg by mouth every 6 (six) hours as needed for moderate pain or headache.     aspirin EC 81 MG tablet Take 81 mg by mouth in the morning. Swallow whole.     atorvastatin (LIPITOR) 10 MG tablet Take 1 tablet (10 mg total) by mouth in the morning.     AURYXIA 1 GM 210 MG(Fe) tablet Take 420 mg by mouth 3 (three) times daily with meals.     B Complex-C-Folic Acid (DIALYVITE 683) 0.8 MG TABS Take 1 tablet by mouth in the morning.     blood glucose meter kit and supplies KIT Dispense based on patient and insurance preference. Use up to four times daily as directed. (FOR ICD-9 250.00, 250.01). For QAC - HS accuchecks. 1 each 1   brimonidine (ALPHAGAN) 0.15 % ophthalmic solution Place 1 drop into the right eye 3 (three) times  daily.     carvedilol (COREG) 3.125 MG tablet Take 1 tablet (3.125 mg total) by mouth 2 (two) times daily with a meal.     Cinacalcet HCl (SENSIPAR PO) Take by mouth.     clopidogrel (PLAVIX) 75 MG tablet Take 1 tablet (75 mg total) by mouth daily with breakfast. 30 tablet 5   dorzolamide-timolol (COSOPT) 22.3-6.8 MG/ML ophthalmic solution Place 1 drop into the right eye 2 (two) times daily.  11   Doxercalciferol (HECTOROL IV) Doxercalciferol (Hectorol)     folic acid-vitamin b complex-vitamin c-selenium-zinc (DIALYVITE) 3 MG TABS tablet Take 1 tablet by mouth daily.     hydrocortisone cream 1 % Apply 1 application topically 3 (three) times daily as needed for itching.     hydroxypropyl methylcellulose / hypromellose (ISOPTO TEARS / GONIOVISC) 2.5 % ophthalmic solution Place 1 drop into the left eye as needed for dry eyes.     isosorbide mononitrate (IMDUR) 30 MG 24 hr tablet Take 1 tablet (30 mg total) by mouth in the morning.     Lancets (ONETOUCH DELICA PLUS [id number]) MISC      Melatonin 5 MG CAPS Take by mouth.     Methoxy PEG-Epoetin Beta (MIRCERA IJ) Mircera     ONETOUCH VERIO test strip USE AS DIRECTED TO TEST FOUR TIMES A DAY 100 each 3   oxyCODONE-acetaminophen (PERCOCET) 5-325 MG tablet Take 1 tablet by mouth every 6 (six) hours as needed (pain). 28 tablet 0   pantoprazole (PROTONIX) 40 MG tablet Take 1 tablet (40 mg total) by mouth daily at 12 noon.     pilocarpine (PILOCAR) 4 % ophthalmic solution Place 1 drop into the right eye 4 (four) times daily.      ROCKLATAN 0.02-0.005 % SOLN Place 1 drop into the right eye at bedtime.     carbidopa-levodopa (SINEMET) 25-100 MG tablet Take 1 tablet by mouth 3 (three) times daily. (Patient taking differently: Take 1 tablet by mouth 3 (three) times daily. 1 TABLET @ 10am/ 4pm/ 10pm) 90 tablet 1   gabapentin (NEURONTIN) 100 MG capsule Take 1 capsule (100 mg total) by mouth daily. 30 capsule 0   No facility-administered medications prior to  visit.     Allergies:   Patient has no known allergies.   Social History   Socioeconomic History   Marital status: Widowed    Spouse name:  Not on file   Number of children: Not on file   Years of education: Not on file   Highest education level: Not on file  Occupational History   Occupation: retired  Tobacco Use   Smoking status: Former    Packs/day: 0.50    Types: Cigarettes    Quit date: 03/23/2018    Years since quitting: 2.7   Smokeless tobacco: Never  Vaping Use   Vaping Use: Never used  Substance and Sexual Activity   Alcohol use: No   Drug use: No   Sexual activity: Not Currently  Other Topics Concern   Not on file  Social History Narrative   Right handed    Facility   Social Determinants of Health   Financial Resource Strain: Low Risk    Difficulty of Paying Living Expenses: Not hard at all  Food Insecurity: No Food Insecurity   Worried About Charity fundraiser in the Last Year: Never true   [place] in the Last Year: Never true  Transportation Needs: No Transportation Needs   Lack of Transportation (Medical): No   Lack of Transportation (Non-Medical): No  Physical Activity: Inactive   Days of Exercise per Week: 0 days   Minutes of Exercise per Session: 0 min  Stress: No Stress Concern Present   Feeling of Stress : Not at all  Social Connections: Not on file     Family History:  The patient's family history includes CAD in his mother.     ROS:   Please see the history of present illness.    ROS All other systems reviewed and are negative.   PHYSICAL EXAM:   VS:  BP (!) 92/58   Pulse 77   Ht _0  (1.651 m)   Wt 143 lb (64.9 kg)   SpO2 98%   BMI 23.80 kg/m    GEN: Well nourished, well developed, in no acute distress, chronically ill appearing. HEENT: normal Neck: no JVD or masses Cardiac: RRR; soft flow murmur. No rubs, or gallops,no edema  Respiratory:  clear to auscultation bilaterally, normal work of breathing GI: soft,  nontender, nondistended, + BS MS: no deformity or atrophy s/p L BKA Skin: warm and dry, no rash.  Neuro:  Alert and Oriented x 3, Strength and sensation are intact Psych: euthymic mood, full affect   Wt Readings from Last 3 Encounters:  01/05/21 143 lb (64.9 kg)  11/10/20 150 lb (68 kg)  08/19/20 158 lb 11.7 oz (72 kg)      Studies/Labs Reviewed:   EKG:  EKG is NOT ordered today.    Recent Labs: 01/14/2020: B Natriuretic Peptide 335.4 01/20/2020: Magnesium 2.0 10/19/2020: ALT 16; BUN 10; Creatinine, Ser 3.50; Hemoglobin 12.6; Platelets 224; Potassium 3.9; Sodium 135   Lipid Panel    Component Value Date/Time   LDLCALC 103 09/11/2019 0000    Additional studies/ records that were reviewed today include:  TAVR OPERATIVE NOTE Date of Procedure:                01/19/2020   Preoperative Diagnosis:      Severe Aortic Stenosis    Postoperative Diagnosis:    Same    Procedure:        Transcatheter Aortic Valve Replacement - Percutaneous Left Transfemoral Approach             Edwards Sapien 3 Ultra THV (size 26 mm, model # 9750TFX, serial # [id number])  Co-Surgeons:                        Gaye Pollack, MD and Lauree Chandler, MD     Anesthesiologist:                  Suzette Battiest, MD   Echocardiographer:              Ena Dawley, MD   Pre-operative Echo Findings: Severe aortic stenosis Normal left ventricular systolic function   Post-operative Echo Findings: No paravalvular leak Normal left ventricular systolic function   _____________   Echo 01/20/20:  IMPRESSIONS   1. Left ventricular ejection fraction, by estimation, is 60 to 65%. The  left ventricle has normal function. The left ventricle has no regional  wall motion abnormalities. Left ventricular diastolic parameters were  normal.   2. Right ventricular systolic function is normal. The right ventricular  size is normal.   3. The mitral valve is degenerative. Trivial mitral valve  regurgitation.  No evidence of mitral stenosis. Severe mitral annular calcification.   4. Post TAVR 11/16 with 26 mm Sapien 3 Ultra valve. No PVL. Tracings by  tech seem contaminated with high peak gradient measured. I measure mean  gradient closer to 4 mmHg and peak 8 mmHg see image 44 regarding signals  DVI 0.7 and AVA over 3 cm2. The  aortic valve has been repaired/replaced. Aortic valve regurgitation is not  visualized. No aortic stenosis is present. There is a 26 mm Sapien  prosthetic (TAVR) valve present in the aortic position. Procedure Date:  01/19/2020.   5. The inferior vena cava is normal in size with greater than 50%  respiratory variability, suggesting right atrial pressure of 3 mmHg.  _______________________   Elwyn Reach AT 01/20/20 Impression: 1. Brief atrial tachycardia episodes (6 episodes in 14 days; longest 10.8 seconds). 2. No symptoms reported.  3. Rare ectopy.   _______________________  Echo 03/02/20 IMPRESSIONS  1. Left ventricular ejection fraction, by estimation, is 60 to 65%. The left ventricle has normal function. The left ventricle has no regional wall motion abnormalities. Left ventricular diastolic parameters are indeterminate.  2. Right ventricular systolic function is normal. The right ventricular size is normal.  3. The mitral valve is degenerative. Trivial mitral valve regurgitation. Severe mitral annular calcification.  4. No Paravalular leak noted; Acceleration time < 100 ms; DVI 0.76. The aortic valve has been repaired/replaced. Aortic valve regurgitation is not visualized. 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 Procedure  Date: 01/19/2020.  5. The inferior vena cava is normal in size with greater than 50% respiratory variability, suggesting right atrial pressure of 3 mmHg.   Comparison(s): A prior study was performed on 01/20/2020. Stable prosthetic valve measurements from prior  study.  _____________________  Echo 01/05/21 IMPRESSIONS  1. Intracavitary gradient. Peak velocity 1.8 m/s. Peak gradient 13 mmHg. Left ventricular ejection fraction, by estimation, is 65 to 70%. The left ventricle has normal function. The left ventricle has no regional wall motion abnormalities. Left  ventricular diastolic parameters are consistent with Grade I diastolic dysfunction (impaired relaxation). Elevated left ventricular end-diastolic pressure.  2. Right ventricular systolic function is normal. The right ventricular size is normal.  3. The mitral valve is normal in structure. No evidence of mitral valve regurgitation. No evidence of mitral stenosis. Moderate mitral annular calcification.  4. The aortic valve has been repaired/replaced. Aortic valve regurgitation is not visualized. No aortic  stenosis is present.  5. The inferior vena cava is normal in size with greater than 50% respiratory variability, suggesting right atrial pressure of 3 mmHg.    ASSESSMENT & PLAN:   Severe AS s/p TAVR: echo today shows EF 60%, normally functioning TAVR with a mean gradient of 6.2 mm hg and no PVL. He has NYHA class I symptoms but not very active. He has amoxicillin for SBE prophylaxis. Continue on aspirin and Plavix given recent PV intervention. Continue regular follow up with Dr. Audie Box   ESRD on HD: having some low BPs in general, esp after HD. Would consider holding Coreg on HD days if it continues.    Pulmonary nodule: pre TAVR CT showed a tiny 3 mm solid left upper lobe pulmonary nodule. No follow-up needed if patient is low-risk. Non-contrast chest CT can be considered in 12 months if patient is high-risk. Pt has a smoking history so I will have this set up.  PAD: he underwent peripheral arteriogram in May with intervention including stent and angioplasy of left SFA. He was then admitted for for left below the knee amputation 08/12/2020 secondary to critical limb ischemia left lower extremity  with tissue loss. Continue on aspirin and plavix per [facility]  Medication Adjustments/Labs and Tests Ordered: Current medicines are reviewed at length with the patient today.  Concerns regarding medicines are outlined above.  Medication changes, Labs and Tests ordered today are listed in the Patient Instructions below. Patient Instructions  Medication Instructions:  Your physician recommends that you continue on your current medications as directed. Please refer to the Current Medication list given to you today.  *If you need a refill on your cardiac medications before your next appointment, please call your pharmacy*   Lab Work: None ordered   If you have labs (blood work) drawn today and your tests are completely normal, you will receive your results only by: [place] (if you have MyChart) OR A paper copy in the mail If you have any lab test that is abnormal or we need to change your treatment, we will call you to review the results.   Testing/Procedures: Your physician has requested that you have a chest ct    Follow-Up: At [facility], you and your health needs are our priority.  As part of our continuing mission to provide you with exceptional heart care, we have created designated Provider Care Teams.  These Care Teams include your primary Cardiologist (physician) and Advanced Practice Providers (APPs -  Physician Assistants and Nurse Practitioners) who all work together to provide you with the care you need, when you need it.  We recommend signing up for the patient portal called "MyChart".  Sign up information is provided on this After Visit Summary.  MyChart is used to connect with patients for Virtual Visits (Telemedicine).  Patients are able to view lab/test results, encounter notes, upcoming appointments, etc.  Non-urgent messages can be sent to your provider as well.   To learn more about what you can do with MyChart, go to [email or web address].    Your next  appointment:   6 month(s)  The format for your next appointment:   In Person  Provider:   You may see Evalina Field, MD or one of the following Advanced Practice Providers on your designated Care Team:   Almyra Deforest, PA-C Fabian Sharp, [place] or  Roby Lofts, [place]   Other Instructions None      Signed, [name], PA-C  01/06/2021 2:22 AM  [facility] [place], [place], [place]  [address] Phone: [phone]; Fax: [phone]

## 2021-01-05 NOTE — Patient Instructions (Addendum)
Medication Instructions:  Your physician recommends that you continue on your current medications as directed. Please refer to the Current Medication list given to you today.  *If you need a refill on your cardiac medications before your next appointment, please call your pharmacy*   Lab Work: None ordered   If you have labs (blood work) drawn today and your tests are completely normal, you will receive your results only by: [place] (if you have MyChart) OR A paper copy in the mail If you have any lab test that is abnormal or we need to change your treatment, we will call you to review the results.   Testing/Procedures: Your physician has requested that you have a chest ct    Follow-Up: At [facility], you and your health needs are our priority.  As part of our continuing mission to provide you with exceptional heart care, we have created designated Provider Care Teams.  These Care Teams include your primary Cardiologist (physician) and Advanced Practice Providers (APPs -  Physician Assistants and Nurse Practitioners) who all work together to provide you with the care you need, when you need it.  We recommend signing up for the patient portal called "MyChart".  Sign up information is provided on this After Visit Summary.  MyChart is used to connect with patients for Virtual Visits (Telemedicine).  Patients are able to view lab/test results, encounter notes, upcoming appointments, etc.  Non-urgent messages can be sent to your provider as well.   To learn more about what you can do with MyChart, go to [email or web address].    Your next appointment:   6 month(s)  The format for your next appointment:   In Person  Provider:   You may see Evalina Field, MD or one of the following Advanced Practice Providers on your designated Care Team:   Almyra Deforest, PA-C Fabian Sharp, PA-C or  Roby Lofts, [place]   Other Instructions None

## 2021-01-10 ENCOUNTER — Other Ambulatory Visit: Payer: Self-pay

## 2021-01-10 ENCOUNTER — Ambulatory Visit (INDEPENDENT_AMBULATORY_CARE_PROVIDER_SITE_OTHER): Payer: Medicare Other | Admitting: Podiatry

## 2021-01-10 DIAGNOSIS — M79674 Pain in right toe(s): Secondary | ICD-10-CM | POA: Diagnosis not present

## 2021-01-10 DIAGNOSIS — M79675 Pain in left toe(s): Secondary | ICD-10-CM | POA: Diagnosis not present

## 2021-01-10 DIAGNOSIS — E1151 Type 2 diabetes mellitus with diabetic peripheral angiopathy without gangrene: Secondary | ICD-10-CM | POA: Diagnosis not present

## 2021-01-10 DIAGNOSIS — Z89512 Acquired absence of left leg below knee: Secondary | ICD-10-CM

## 2021-01-10 DIAGNOSIS — B351 Tinea unguium: Secondary | ICD-10-CM

## 2021-01-13 ENCOUNTER — Encounter: Payer: Self-pay | Admitting: Podiatry

## 2021-01-13 NOTE — Progress Notes (Signed)
  Subjective:  Patient ID: Elijah White, male    DOB: October 06, 1940,  MRN: [id number]  Elijah White presents to clinic today for at risk foot care. Patient has h/o amputation of below knee amputation left lower extremity.  He is a resident at [facility].  Patient's daughter is present during today's visit. Elijah White complains of intermittent burning in heel. He states this does not occur constantly. Denies any skin breakdown, no rest pain or changes in skin.  PCP is Elijah Shivers, MD.  No Known Allergies  Review of Systems: Negative except as noted in the HPI. Objective:   Constitutional Elijah White is a pleasant 80 y.o. African American male, WD, WN in NAD. AAO x 3.   Vascular Capillary refill time to digits <4 seconds right lower extremity. Faintly palpable DP pulse(s) RLE. Nonpalpable PT pulse(s) right lower extremity. Pedal hair absent. No pain with calf compression RLE. No cyanosis or clubbing noted.  Neurologic Normal speech. Oriented to person, place, and time. Protective sensation diminished with 10 gram monofilament right lower extremity.  Dermatologic No evidence of heel or ankle decub RLE. Pedal skin is thin shiny, atrophic right lower extremity. No open wounds right lower extremity. No interdigital macerations right lower extremity. Toenails 1-5 right elongated, discolored, dystrophic, thickened, and crumbly with subungual debris and tenderness to dorsal palpation.  Orthopedic: Noted disuse atrophy right lower extremity. Utilizes wheelchair for mobility assistance.   Radiographs: None Assessment:   1. Pain due to onychomycosis of toenails of both feet   2. Status post below knee amputation, left ([place])   3. Type II diabetes mellitus with peripheral circulatory disorder [facility])    Plan:  Patient was evaluated and treated and all questions answered. Consent given for treatment as described below: -Continue pressure precautions daily. -Continue  diabetic foot care principles: inspect feet daily, monitor glucose as recommended by PCP and/or Endocrinologist, and follow prescribed diet per PCP, Endocrinologist and/or dietician. -Toenails 1-5 right debrided in length and girth without iatrogenic bleeding with sterile nail nipper and dremel.  -Patient/POA to call should there be question/concern in the interim.  Return in about 3 months (around 04/12/2021).  [name], DPM

## 2021-03-08 ENCOUNTER — Other Ambulatory Visit: Payer: Self-pay

## 2021-03-08 DIAGNOSIS — I96 Gangrene, not elsewhere classified: Secondary | ICD-10-CM

## 2021-03-15 ENCOUNTER — Emergency Department (HOSPITAL_COMMUNITY): Payer: Medicare Other

## 2021-03-15 ENCOUNTER — Inpatient Hospital Stay (HOSPITAL_COMMUNITY): Payer: Medicare Other

## 2021-03-15 ENCOUNTER — Other Ambulatory Visit: Payer: Self-pay

## 2021-03-15 ENCOUNTER — Encounter (HOSPITAL_COMMUNITY): Payer: Self-pay

## 2021-03-15 ENCOUNTER — Inpatient Hospital Stay (HOSPITAL_COMMUNITY)
Admission: EM | Admit: 2021-03-15 | Discharge: 2021-03-26 | DRG: 871 | Disposition: A | Discharge status: Skilled Nursing Facility | Payer: Medicare Other | Source: Skilled Nursing Facility | Attending: Internal Medicine | Admitting: Internal Medicine

## 2021-03-15 DIAGNOSIS — I4510 Unspecified right bundle-branch block: Secondary | ICD-10-CM | POA: Diagnosis present

## 2021-03-15 DIAGNOSIS — Z7902 Long term (current) use of antithrombotics/antiplatelets: Secondary | ICD-10-CM

## 2021-03-15 DIAGNOSIS — Z6822 Body mass index (BMI) 22.0-22.9, adult: Secondary | ICD-10-CM

## 2021-03-15 DIAGNOSIS — N136 Pyonephrosis: Secondary | ICD-10-CM | POA: Diagnosis present

## 2021-03-15 DIAGNOSIS — H548 Legal blindness, as defined in USA: Secondary | ICD-10-CM | POA: Diagnosis present

## 2021-03-15 DIAGNOSIS — K7011 Alcoholic hepatitis with ascites: Secondary | ICD-10-CM | POA: Insufficient documentation

## 2021-03-15 DIAGNOSIS — D631 Anemia in chronic kidney disease: Secondary | ICD-10-CM | POA: Diagnosis present

## 2021-03-15 DIAGNOSIS — K317 Polyp of stomach and duodenum: Secondary | ICD-10-CM | POA: Diagnosis present

## 2021-03-15 DIAGNOSIS — N390 Urinary tract infection, site not specified: Secondary | ICD-10-CM

## 2021-03-15 DIAGNOSIS — N2581 Secondary hyperparathyroidism of renal origin: Secondary | ICD-10-CM | POA: Diagnosis present

## 2021-03-15 DIAGNOSIS — K810 Acute cholecystitis: Secondary | ICD-10-CM

## 2021-03-15 DIAGNOSIS — K8000 Calculus of gallbladder with acute cholecystitis without obstruction: Secondary | ICD-10-CM

## 2021-03-15 DIAGNOSIS — I4891 Unspecified atrial fibrillation: Secondary | ICD-10-CM | POA: Diagnosis not present

## 2021-03-15 DIAGNOSIS — Z89512 Acquired absence of left leg below knee: Secondary | ICD-10-CM

## 2021-03-15 DIAGNOSIS — R6520 Severe sepsis without septic shock: Secondary | ICD-10-CM | POA: Diagnosis present

## 2021-03-15 DIAGNOSIS — Z87891 Personal history of nicotine dependence: Secondary | ICD-10-CM

## 2021-03-15 DIAGNOSIS — R54 Age-related physical debility: Secondary | ICD-10-CM | POA: Diagnosis present

## 2021-03-15 DIAGNOSIS — Z7982 Long term (current) use of aspirin: Secondary | ICD-10-CM | POA: Diagnosis not present

## 2021-03-15 DIAGNOSIS — I959 Hypotension, unspecified: Secondary | ICD-10-CM | POA: Diagnosis not present

## 2021-03-15 DIAGNOSIS — E1151 Type 2 diabetes mellitus with diabetic peripheral angiopathy without gangrene: Secondary | ICD-10-CM | POA: Diagnosis present

## 2021-03-15 DIAGNOSIS — E1165 Type 2 diabetes mellitus with hyperglycemia: Secondary | ICD-10-CM | POA: Diagnosis not present

## 2021-03-15 DIAGNOSIS — Z79899 Other long term (current) drug therapy: Secondary | ICD-10-CM | POA: Diagnosis not present

## 2021-03-15 DIAGNOSIS — I120 Hypertensive chronic kidney disease with stage 5 chronic kidney disease or end stage renal disease: Secondary | ICD-10-CM | POA: Diagnosis present

## 2021-03-15 DIAGNOSIS — A419 Sepsis, unspecified organism: Principal | ICD-10-CM | POA: Diagnosis present

## 2021-03-15 DIAGNOSIS — D72829 Elevated white blood cell count, unspecified: Secondary | ICD-10-CM

## 2021-03-15 DIAGNOSIS — J690 Pneumonitis due to inhalation of food and vomit: Secondary | ICD-10-CM | POA: Diagnosis not present

## 2021-03-15 DIAGNOSIS — Z952 Presence of prosthetic heart valve: Secondary | ICD-10-CM

## 2021-03-15 DIAGNOSIS — R109 Unspecified abdominal pain: Secondary | ICD-10-CM

## 2021-03-15 DIAGNOSIS — N186 End stage renal disease: Secondary | ICD-10-CM | POA: Diagnosis present

## 2021-03-15 DIAGNOSIS — B3781 Candidal esophagitis: Secondary | ICD-10-CM | POA: Diagnosis not present

## 2021-03-15 DIAGNOSIS — M898X9 Other specified disorders of bone, unspecified site: Secondary | ICD-10-CM | POA: Diagnosis present

## 2021-03-15 DIAGNOSIS — R1011 Right upper quadrant pain: Secondary | ICD-10-CM | POA: Diagnosis present

## 2021-03-15 DIAGNOSIS — T17908A Unspecified foreign body in respiratory tract, part unspecified causing other injury, initial encounter: Secondary | ICD-10-CM

## 2021-03-15 DIAGNOSIS — M1990 Unspecified osteoarthritis, unspecified site: Secondary | ICD-10-CM | POA: Diagnosis present

## 2021-03-15 DIAGNOSIS — I2510 Atherosclerotic heart disease of native coronary artery without angina pectoris: Secondary | ICD-10-CM | POA: Diagnosis present

## 2021-03-15 DIAGNOSIS — Z8249 Family history of ischemic heart disease and other diseases of the circulatory system: Secondary | ICD-10-CM

## 2021-03-15 DIAGNOSIS — R627 Adult failure to thrive: Secondary | ICD-10-CM | POA: Diagnosis present

## 2021-03-15 DIAGNOSIS — I739 Peripheral vascular disease, unspecified: Secondary | ICD-10-CM | POA: Diagnosis not present

## 2021-03-15 DIAGNOSIS — Z66 Do not resuscitate: Secondary | ICD-10-CM | POA: Diagnosis present

## 2021-03-15 DIAGNOSIS — Z992 Dependence on renal dialysis: Secondary | ICD-10-CM | POA: Diagnosis not present

## 2021-03-15 DIAGNOSIS — E875 Hyperkalemia: Secondary | ICD-10-CM | POA: Diagnosis not present

## 2021-03-15 DIAGNOSIS — J449 Chronic obstructive pulmonary disease, unspecified: Secondary | ICD-10-CM | POA: Diagnosis present

## 2021-03-15 DIAGNOSIS — H409 Unspecified glaucoma: Secondary | ICD-10-CM | POA: Diagnosis present

## 2021-03-15 DIAGNOSIS — Z8546 Personal history of malignant neoplasm of prostate: Secondary | ICD-10-CM

## 2021-03-15 DIAGNOSIS — I441 Atrioventricular block, second degree: Secondary | ICD-10-CM | POA: Diagnosis not present

## 2021-03-15 DIAGNOSIS — E1140 Type 2 diabetes mellitus with diabetic neuropathy, unspecified: Secondary | ICD-10-CM | POA: Diagnosis present

## 2021-03-15 DIAGNOSIS — E1122 Type 2 diabetes mellitus with diabetic chronic kidney disease: Secondary | ICD-10-CM | POA: Diagnosis present

## 2021-03-15 DIAGNOSIS — R0902 Hypoxemia: Secondary | ICD-10-CM

## 2021-03-15 DIAGNOSIS — G20 Parkinson's disease: Secondary | ICD-10-CM | POA: Diagnosis present

## 2021-03-15 DIAGNOSIS — Z20822 Contact with and (suspected) exposure to covid-19: Secondary | ICD-10-CM | POA: Diagnosis present

## 2021-03-15 DIAGNOSIS — R339 Retention of urine, unspecified: Secondary | ICD-10-CM | POA: Diagnosis not present

## 2021-03-15 DIAGNOSIS — K429 Umbilical hernia without obstruction or gangrene: Secondary | ICD-10-CM | POA: Diagnosis present

## 2021-03-15 DIAGNOSIS — K2950 Unspecified chronic gastritis without bleeding: Secondary | ICD-10-CM | POA: Diagnosis present

## 2021-03-15 DIAGNOSIS — I9589 Other hypotension: Secondary | ICD-10-CM | POA: Diagnosis not present

## 2021-03-15 DIAGNOSIS — K219 Gastro-esophageal reflux disease without esophagitis: Secondary | ICD-10-CM | POA: Diagnosis present

## 2021-03-15 LAB — CBC WITH DIFFERENTIAL/PLATELET
Abs Immature Granulocytes: 0.12 10*3/uL — ABNORMAL HIGH (ref 0.00–0.07)
Basophils Absolute: 0 10*3/uL (ref 0.0–0.1)
Basophils Relative: 0 %
Eosinophils Absolute: 0 10*3/uL (ref 0.0–0.5)
Eosinophils Relative: 0 %
HCT: 42.1 % (ref 39.0–52.0)
Hemoglobin: 13.8 g/dL (ref 13.0–17.0)
Immature Granulocytes: 1 %
Lymphocytes Relative: 3 %
Lymphs Abs: 0.6 10*3/uL — ABNORMAL LOW (ref 0.7–4.0)
MCH: 34.8 pg — ABNORMAL HIGH (ref 26.0–34.0)
MCHC: 32.8 g/dL (ref 30.0–36.0)
MCV: 106.3 fL — ABNORMAL HIGH (ref 80.0–100.0)
Monocytes Absolute: 1.4 10*3/uL — ABNORMAL HIGH (ref 0.1–1.0)
Monocytes Relative: 7 %
Neutro Abs: 16.9 10*3/uL — ABNORMAL HIGH (ref 1.7–7.7)
Neutrophils Relative %: 89 %
Platelets: 414 10*3/uL — ABNORMAL HIGH (ref 150–400)
RBC: 3.96 MIL/uL — ABNORMAL LOW (ref 4.22–5.81)
RDW: 13.9 % (ref 11.5–15.5)
WBC: 19.1 10*3/uL — ABNORMAL HIGH (ref 4.0–10.5)
nRBC: 0 % (ref 0.0–0.2)

## 2021-03-15 LAB — COMPREHENSIVE METABOLIC PANEL
ALT: 10 U/L (ref 0–44)
AST: 26 U/L (ref 15–41)
Albumin: 3.2 g/dL — ABNORMAL LOW (ref 3.5–5.0)
Alkaline Phosphatase: 92 U/L (ref 38–126)
Anion gap: 20 — ABNORMAL HIGH (ref 5–15)
BUN: 28 mg/dL — ABNORMAL HIGH (ref 8–23)
CO2: 23 mmol/L (ref 22–32)
Calcium: 9.1 mg/dL (ref 8.9–10.3)
Chloride: 91 mmol/L — ABNORMAL LOW (ref 98–111)
Creatinine, Ser: 7.25 mg/dL — ABNORMAL HIGH (ref 0.61–1.24)
GFR, Estimated: 7 mL/min — ABNORMAL LOW (ref >60)
Glucose, Bld: 179 mg/dL — ABNORMAL HIGH (ref 70–99)
Potassium: 4.8 mmol/L (ref 3.5–5.1)
Sodium: 134 mmol/L — ABNORMAL LOW (ref 135–145)
Total Bilirubin: 1 mg/dL (ref 0.3–1.2)
Total Protein: 7.4 g/dL (ref 6.5–8.1)

## 2021-03-15 LAB — RESP PANEL BY RT-PCR (FLU A&B, COVID) ARPGX2
Influenza A by PCR: NEGATIVE
Influenza B by PCR: NEGATIVE
SARS Coronavirus 2 by RT PCR: NEGATIVE

## 2021-03-15 LAB — LIPASE, BLOOD: Lipase: 20 U/L (ref 11–51)

## 2021-03-15 MED ORDER — DOCUSATE SODIUM 100 MG PO CAPS
100.0000 mg | ORAL_CAPSULE | Freq: Two times a day (BID) | ORAL | Status: DC
  Administered 2021-03-15: 100 mg via ORAL
  Filled 2021-03-15: qty 1

## 2021-03-15 MED ORDER — BRIMONIDINE TARTRATE 0.2 % OP SOLN
1.0000 [drp] | Freq: Three times a day (TID) | OPHTHALMIC | Status: DC
  Administered 2021-03-15 – 2021-03-26 (×27): 1 [drp] via OPHTHALMIC
  Filled 2021-03-15 (×2): qty 5

## 2021-03-15 MED ORDER — IPRATROPIUM-ALBUTEROL 0.5-2.5 (3) MG/3ML IN SOLN: 3.0000 mL | RESPIRATORY_TRACT | Status: DC | PRN

## 2021-03-15 MED ORDER — MORPHINE SULFATE (PF) 2 MG/ML IV SOLN: 2.0000 mg | INTRAVENOUS | Status: DC | PRN

## 2021-03-15 MED ORDER — DORZOLAMIDE HCL-TIMOLOL MAL 2-0.5 % OP SOLN
1.0000 [drp] | Freq: Two times a day (BID) | OPHTHALMIC | Status: DC
  Administered 2021-03-15 – 2021-03-16 (×2): 1 [drp] via OPHTHALMIC
  Filled 2021-03-15: qty 10

## 2021-03-15 MED ORDER — FENTANYL CITRATE PF 50 MCG/ML IJ SOSY
40.0000 ug | PREFILLED_SYRINGE | Freq: Once | INTRAMUSCULAR | Status: AC
  Administered 2021-03-15: 40 ug via INTRAVENOUS
  Filled 2021-03-15: qty 1

## 2021-03-15 MED ORDER — ATORVASTATIN CALCIUM 10 MG PO TABS
10.0000 mg | ORAL_TABLET | Freq: Every morning | ORAL | Status: DC
  Administered 2021-03-16 – 2021-03-17 (×2): 10 mg via ORAL
  Filled 2021-03-15 (×2): qty 1

## 2021-03-15 MED ORDER — PILOCARPINE HCL 4 % OP SOLN
1.0000 [drp] | Freq: Four times a day (QID) | OPHTHALMIC | Status: DC
  Administered 2021-03-15 – 2021-03-26 (×30): 1 [drp] via OPHTHALMIC
  Filled 2021-03-15 (×2): qty 15

## 2021-03-15 MED ORDER — GABAPENTIN 100 MG PO CAPS
100.0000 mg | ORAL_CAPSULE | Freq: Every day | ORAL | Status: DC
  Administered 2021-03-15 – 2021-03-17 (×3): 100 mg via ORAL
  Filled 2021-03-15 (×3): qty 1

## 2021-03-15 MED ORDER — ONDANSETRON HCL 4 MG PO TABS
4.0000 mg | ORAL_TABLET | Freq: Four times a day (QID) | ORAL | Status: DC | PRN
  Filled 2021-03-15 (×3): qty 1

## 2021-03-15 MED ORDER — SODIUM CHLORIDE 0.9 % IV SOLN
3.0000 g | Freq: Every evening | INTRAVENOUS | Status: DC
  Filled 2021-03-15: qty 8

## 2021-03-15 MED ORDER — HYDROMORPHONE HCL 1 MG/ML IJ SOLN
0.5000 mg | INTRAMUSCULAR | Status: DC | PRN
  Administered 2021-03-15 (×3): 1 mg via INTRAVENOUS
  Administered 2021-03-15: 0.5 mg via INTRAVENOUS
  Administered 2021-03-16 – 2021-03-17 (×7): 1 mg via INTRAVENOUS
  Filled 2021-03-15 (×12): qty 1

## 2021-03-15 MED ORDER — CARBIDOPA-LEVODOPA 25-100 MG PO TABS
1.0000 | ORAL_TABLET | Freq: Three times a day (TID) | ORAL | Status: DC
  Administered 2021-03-15 – 2021-03-26 (×24): 1 via ORAL
  Filled 2021-03-15 (×35): qty 1

## 2021-03-15 MED ORDER — ASPIRIN EC 81 MG PO TBEC
81.0000 mg | DELAYED_RELEASE_TABLET | Freq: Every morning | ORAL | Status: DC
  Administered 2021-03-16 – 2021-03-17 (×2): 81 mg via ORAL
  Filled 2021-03-15 (×2): qty 1

## 2021-03-15 MED ORDER — FERRIC CITRATE 1 GM 210 MG(FE) PO TABS
420.0000 mg | ORAL_TABLET | Freq: Three times a day (TID) | ORAL | Status: DC
  Administered 2021-03-15: 420 mg via ORAL
  Filled 2021-03-15 (×6): qty 2

## 2021-03-15 MED ORDER — ACETAMINOPHEN 500 MG PO TABS
1000.0000 mg | ORAL_TABLET | Freq: Four times a day (QID) | ORAL | Status: DC | PRN
  Administered 2021-03-16: 1000 mg via ORAL
  Filled 2021-03-15: qty 2

## 2021-03-15 MED ORDER — POLYETHYLENE GLYCOL 3350 17 G PO PACK: 17.0000 g | PACK | Freq: Every day | ORAL | Status: DC

## 2021-03-15 MED ORDER — ONDANSETRON HCL 4 MG/2ML IJ SOLN
4.0000 mg | Freq: Four times a day (QID) | INTRAMUSCULAR | Status: DC | PRN
  Administered 2021-03-15 – 2021-03-26 (×11): 4 mg via INTRAVENOUS
  Filled 2021-03-15 (×12): qty 2

## 2021-03-15 MED ORDER — PIPERACILLIN-TAZOBACTAM IN DEX 2-0.25 GM/50ML IV SOLN
2.2500 g | Freq: Three times a day (TID) | INTRAVENOUS | Status: DC
  Administered 2021-03-15 – 2021-03-21 (×17): 2.25 g via INTRAVENOUS
  Filled 2021-03-15 (×24): qty 50

## 2021-03-15 MED ORDER — HEPARIN SODIUM (PORCINE) 5000 UNIT/ML IJ SOLN
5000.0000 [IU] | Freq: Two times a day (BID) | INTRAMUSCULAR | Status: DC
  Administered 2021-03-15: 5000 [IU] via SUBCUTANEOUS
  Filled 2021-03-15 (×2): qty 1

## 2021-03-15 MED ORDER — PANTOPRAZOLE SODIUM 40 MG PO TBEC
40.0000 mg | DELAYED_RELEASE_TABLET | Freq: Every day | ORAL | Status: DC
  Administered 2021-03-15 – 2021-03-16 (×2): 40 mg via ORAL
  Filled 2021-03-15 (×2): qty 1

## 2021-03-15 MED ORDER — HYDRALAZINE HCL 25 MG PO TABS: 25.0000 mg | ORAL_TABLET | Freq: Four times a day (QID) | ORAL | Status: DC | PRN

## 2021-03-15 MED ORDER — NETARSUDIL-LATANOPROST 0.02-0.005 % OP SOLN: 1.0000 [drp] | Freq: Every day | OPHTHALMIC | Status: DC

## 2021-03-15 MED ORDER — FENTANYL CITRATE PF 50 MCG/ML IJ SOSY
50.0000 ug | PREFILLED_SYRINGE | Freq: Once | INTRAMUSCULAR | Status: AC
  Administered 2021-03-15: 50 ug via INTRAVENOUS
  Filled 2021-03-15: qty 1

## 2021-03-15 NOTE — ED Notes (Signed)
I notified Dr Roosevelt Locks of pt's Sa02 now 88-90%. I placed pt on 3L 02 per [place]. Pt is also in pain with his abdomen and holding his breath and shallow breathing.

## 2021-03-15 NOTE — ED Notes (Signed)
Pt vomited all over himself. Pt was laying down when he vomited, I happened to come in the room to obtain the COVID swab. I notified Dr Roosevelt Locks

## 2021-03-15 NOTE — ED Provider Notes (Signed)
[facility] EMERGENCY DEPARTMENT Provider Note   CSN: [id number] Arrival date & time: 03/15/21  4696     History  Chief Complaint  Patient presents with   Abdominal Pain    Elijah White is a 81 y.o. male.  Patient presents with right side abdominal pain both right lower and right upper intermittent started yesterday.  No history of similar.  Patient had softer stool and episode of nonbloody vomiting.  Dialysis Monday Wednesday Friday.  Patient was given suppository 4:00 this morning.  Patient lives at [facility].  Patient denies any appendix or gallbladder surgery history.  Nothing specific cause the pain.      Home Medications Prior to Admission medications   Medication Sig Start Date End Date Taking? Authorizing Provider  acetaminophen (TYLENOL) 500 MG tablet Take 1,000 mg by mouth every 6 (six) hours as needed for moderate pain or headache.   Yes [provider]  aspirin EC 81 MG tablet Take 81 mg by mouth in the morning. Swallow whole.   Yes [provider]  atorvastatin (LIPITOR) 10 MG tablet Take 1 tablet (10 mg total) by mouth in the morning. 08/16/20  Yes Rhyne, Samantha J, PA-C  AURYXIA 1 GM 210 MG(Fe) tablet Take 420 mg by mouth 3 (three) times daily with meals. 05/30/20  Yes [provider]  B Complex-C-Folic Acid (DIALYVITE 295) 0.8 MG TABS Take 1 tablet by mouth in the morning. 05/08/19  Yes [provider]  brimonidine (ALPHAGAN) 0.15 % ophthalmic solution Place 1 drop into the right eye 3 (three) times daily.   Yes [provider]  carbidopa-levodopa (SINEMET) 25-100 MG tablet Take 1 tablet by mouth 3 (three) times daily. Patient taking differently: Take 1 tablet by mouth 3 (three) times daily. 1 TABLET @ 8am/ 12pm/ 4pm 10/19/20 03/15/21 Yes Curatolo, Adam, DO  clopidogrel (PLAVIX) 75 MG tablet Take 1 tablet (75 mg total) by mouth daily with breakfast. 07/25/20  Yes Marty Heck, MD   docusate sodium (COLACE) 100 MG capsule Take 100 mg by mouth 2 (two) times daily.   Yes [provider]  dorzolamide-timolol (COSOPT) 22.3-6.8 MG/ML ophthalmic solution Place 1 drop into the right eye 2 (two) times daily. 06/25/17  Yes [provider]  gabapentin (NEURONTIN) 100 MG capsule Take 1 capsule (100 mg total) by mouth daily. 10/19/20 03/15/21 Yes Curatolo, Adam, DO  isosorbide mononitrate (IMDUR) 30 MG 24 hr tablet Take 1 tablet (30 mg total) by mouth in the morning. 08/16/20  Yes Rhyne, Hulen Shouts, PA-C  Melatonin 5 MG CAPS Take 5 mg by mouth at bedtime.   Yes [provider]  pantoprazole (PROTONIX) 40 MG tablet Take 1 tablet (40 mg total) by mouth daily at 12 noon. 08/16/20  Yes Rhyne, Hulen Shouts, PA-C  pilocarpine (PILOCAR) 4 % ophthalmic solution Place 1 drop into the right eye 4 (four) times daily.    Yes [provider]  polyethylene glycol (MIRALAX / GLYCOLAX) 17 g packet Take 17 g by mouth at bedtime.   Yes [provider]  ROCKLATAN 0.02-0.005 % SOLN Place 1 drop into the right eye at bedtime. 06/20/20  Yes [provider]  blood glucose meter kit and supplies KIT Dispense based on patient and insurance preference. Use up to four times daily as directed. (FOR ICD-9 250.00, 250.01). For QAC - HS accuchecks. 03/30/18   Thurnell Lose, MD  carvedilol (COREG) 3.125 MG tablet Take 1 tablet (3.125 mg total)  by mouth 2 (two) times daily with a meal. Patient not taking: Reported on 03/15/2021 08/16/20   Gabriel Earing, PA-C  Doxercalciferol (HECTOROL IV) Doxercalciferol (Hectorol) 08/09/20 08/28/21  [provider]  Lancets (ONETOUCH DELICA PLUS [id number]) [place]  03/31/18   [provider]  Methoxy PEG-Epoetin Beta (MIRCERA IJ) Mircera 09/05/20 09/04/21  [provider]  ONETOUCH VERIO test strip USE AS DIRECTED TO TEST FOUR TIMES A DAY 07/31/18   [name], MD  oxyCODONE-acetaminophen (PERCOCET) 5-325 MG tablet  Take 1 tablet by mouth every 6 (six) hours as needed (pain). Patient not taking: Reported on 03/15/2021 08/19/20   Karoline Caldwell, PA-C      Allergies    Patient has no known allergies.    Review of Systems   Review of Systems  Constitutional:  Negative for chills and fever.  HENT:  Negative for congestion.   Eyes:  Negative for visual disturbance.  Respiratory:  Negative for shortness of breath.   Cardiovascular:  Negative for chest pain.  Gastrointestinal:  Positive for nausea and vomiting. Negative for abdominal pain.  Genitourinary:  Negative for dysuria and flank pain.  Musculoskeletal:  Negative for back pain, neck pain and neck stiffness.  Skin:  Negative for rash.  Neurological:  Negative for light-headedness and headaches.   Physical Exam Updated Vital Signs BP (!) 146/71    Pulse 71    Temp (!) 97.4 F (36.3 C) (Oral)    Resp (!) 22    Ht 5' 5" (1.651 m)    Wt 64.9 kg    SpO2 96%    BMI 23.81 kg/m  Physical Exam Vitals and nursing note reviewed.  Constitutional:      General: He is not in acute distress.    Appearance: He is well-developed.  HENT:     Head: Normocephalic and atraumatic.     Mouth/Throat:     Mouth: Mucous membranes are moist.  Eyes:     General:        Right eye: No discharge.        Left eye: No discharge.     Conjunctiva/sclera: Conjunctivae normal.  Neck:     Trachea: No tracheal deviation.  Cardiovascular:     Rate and Rhythm: Normal rate and regular rhythm.  Pulmonary:     Effort: Pulmonary effort is normal.     Breath sounds: Normal breath sounds.  Abdominal:     General: There is no distension.     Palpations: Abdomen is soft.     Tenderness: There is abdominal tenderness in the right upper quadrant and right lower quadrant. There is no guarding.  Musculoskeletal:     Cervical back: Normal range of motion and neck supple. No rigidity.  Skin:    General: Skin is warm.     Capillary Refill: Capillary refill takes less than 2  seconds.     Findings: No rash.  Neurological:     General: No focal deficit present.     Mental Status: He is alert.     Cranial Nerves: No cranial nerve deficit.  Psychiatric:        Mood and Affect: Mood normal.    ED Results / Procedures / Treatments   Labs (all labs ordered are listed, but only abnormal results are displayed) Labs Reviewed  COMPREHENSIVE METABOLIC PANEL - Abnormal; Notable for the following components:      Result Value   Sodium 134 (*)    Chloride 91 (*)  Glucose, Bld 179 (*)    BUN 28 (*)    Creatinine, Ser 7.25 (*)    Albumin 3.2 (*)    GFR, Estimated 7 (*)    Anion gap 20 (*)    All other components within normal limits  CBC WITH DIFFERENTIAL/PLATELET - Abnormal; Notable for the following components:   WBC 19.1 (*)    RBC 3.96 (*)    MCV 106.3 (*)    MCH 34.8 (*)    Platelets 414 (*)    Neutro Abs 16.9 (*)    Lymphs Abs 0.6 (*)    Monocytes Absolute 1.4 (*)    Abs Immature Granulocytes 0.12 (*)    All other components within normal limits  LIPASE, BLOOD  URINALYSIS, ROUTINE W REFLEX MICROSCOPIC    EKG EKG Interpretation  Date/Time:  Wednesday March 15 2021 08:27:20 EST Ventricular Rate:  83 PR Interval:  126 QRS Duration: 119 QT Interval:  401 QTC Calculation: 472 R Axis:   65 Text Interpretation: Sinus rhythm Atrial premature complexes Sinus pause Incomplete right bundle branch block Confirmed by Elnora Morrison [id number]) on 03/15/2021 8:46:34 AM  Radiology US Abdomen Limited  Result Date: 03/15/2021 CLINICAL DATA:  Right upper quadrant pain EXAM: ULTRASOUND ABDOMEN LIMITED RIGHT UPPER QUADRANT COMPARISON:  None. FINDINGS: Gallbladder: Distended with sludge and calculi measuring up to 4 mm. No wall thickening visualized. No pericholecystic fluid. No sonographic Murphy sign noted by sonographer. Common bile duct: Diameter: 3 mm, normal Liver: Left lobe of liver is poorly visualized due to bowel gas. No focal lesion identified. Within  normal limits in parenchymal echogenicity. Portal vein is patent on color Doppler imaging with normal direction of blood flow towards the liver. Other: None. IMPRESSION: Distended gallbladder with sludge and calculi but no additional sonographic evidence of acute cholecystitis. Electronically Signed   By: Macy Mis M.D.   On: 03/15/2021 11:40   CT Renal Stone Study  Result Date: 03/15/2021 CLINICAL DATA:  Right abdominal pain. EXAM: CT ABDOMEN AND PELVIS WITHOUT CONTRAST TECHNIQUE: Multidetector CT imaging of the abdomen and pelvis was performed following the standard protocol without IV contrast. RADIATION DOSE REDUCTION: This exam was performed according to the departmental dose-optimization program which includes automated exposure control, adjustment of the mA and/or kV according to patient size and/or use of iterative reconstruction technique. COMPARISON:  CTA abdomen and pelvis 12/01/2019 FINDINGS: Lower chest: Trace right pleural fluid is new from prior. Unchanged mild bibasilar bronchiectasis. Mild dependent right lower lobe ground-glass likely subsegmental atelectasis. A partially visualized ascending aortic endograft. Dense coronary artery calcifications are seen. No pericardial effusion. Lack of intra-articular fluid limits evaluation of the abdominal and pelvic organ parenchyma. The following findings are made within this limitation. Hepatobiliary: Smooth liver contours. No gross liver lesion is seen. The gallbladder is again moderately distended with numerous layering small gallstones, similar to prior. No definite gallbladder wall thickening. Pancreas: Unremarkable. No pancreatic ductal dilatation or surrounding inflammatory changes. Spleen: Normal in size without focal abnormality. Adrenals/Urinary Tract: Left adrenal 2.6 cm nodule is unchanged from 11/26/2019 with internal density of 15 Hounsfield units. Right adrenal 15 mm nodule is unchanged in size with internal density of 5 Hounsfield  units suggesting a benign lipid rich adenoma. These both again are likely benign. Mild left-greater-than-right renal pelvis vascular calcifications. Mild bilateral renal cortical thinning is again seen. Bilateral moderate extrarenal pelves are again noted. There is again mild-to-moderate dilatation of the bilateral ureters diffusely down to the level of the ureteropelvic junction likely chronic.  No obstructing renal stone is seen. No hydronephrosis. Within the limitations of lack of IV contrast, no contour deforming renal mass. The urinary bladder is grossly unremarkable. Stomach/Bowel: No bowel wall thickening. The terminal ileum is unremarkable. The appendix is within normal limits. No dilated loops of bowel to indicate bowel obstruction. Vascular/Lymphatic: No abdominal aortic aneurysm. Dense vascular calcifications. No enlarged abdominal or pelvic lymph nodes. 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 Other: Small fat containing umbilical hernia, unchanged. Unchanged small fat containing left inguinal hernia. No free air or free fluid. Musculoskeletal: Mild multilevel degenerative disc changes. Old healed lateral right eleventh rib fracture unchanged. IMPRESSION: 1. No nephroureterolithiasis or hydronephrosis. 2. Cholelithiasis. 3. 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 4. Small fat containing umbilical and left inguinal hernias. 5. Trace right pleural effusion with mild subsegmental atelectasis. 6.  Aortic Atherosclerosis (ICD10-I70.0). Electronically Signed   By: Yvonne Kendall   On: 03/15/2021 11:14    Procedures Procedures    Medications Ordered in ED Medications  fentaNYL (SUBLIMAZE) injection 40 mcg (40 mcg Intravenous Given 03/15/21 1023)  fentaNYL (SUBLIMAZE) injection 50 mcg (50 mcg Intravenous Given 03/15/21 1229)    ED Course/ Medical Decision Making/ A&P                           Medical Decision  Making  Patient presents with worsening right abdominal pain on exam most significant right upper however patient has mild right lower tenderness.  Differential includes cholelithiasis, cholecystitis especially with white blood cell count elevation, bowel related, appendicitis, kidney stone, UTI/pyelonephritis, other.  Plan for blood work, initial results reviewed showing leukocytosis 19,000, patient has chronic kidney disease plan for CT stone study for further delineation and other causes in addition to ultrasound to look for gallbladder pathology.  Pain meds ordered.  Strong clinical suspicion for cholelithiasis and early cholecystitis given white blood cell count 19,000 shift.  CT scan and ultrasound confirm sludge and cholelithiasis, reviewed images and results by myself.  Pain meds ordered.  General surgeon paged. Remaining blood work reviewed showing creatinine 7 known dialysis patient, sodium 134, chloride 91. Discussed with general surgery and they will be on consult, discussed with medicine for admission. Reviewed patient's medical records at bedside from facility.         Final Clinical Impression(s) / ED Diagnoses Final diagnoses:  Right sided abdominal pain  Leukocytosis, unspecified type  Calculus of gallbladder with acute cholecystitis without obstruction    Rx / DC Orders ED Discharge Orders     None         Elnora Morrison, MD 03/15/21 1258

## 2021-03-15 NOTE — ED Notes (Signed)
Help get patient cleaned up sheets changed patient is resting with call bell in reach  

## 2021-03-15 NOTE — ED Notes (Addendum)
ED TO INPATIENT HANDOFF REPORT  ED Nurse Name and Phone #: Torah Pinnock RN [phone]  S Name/Age/Gender Elijah White 81 y.o. male Room/Bed: 045C/045C  Code Status   Code Status: DNR  Home/SNF/Other Rehab Patient oriented to: self, place, time, and situation Is this baseline? Yes   Triage Complete: Triage complete  Chief Complaint Acute cholecystitis [K81.0]  Triage Note Pt arrived via [facility] from [facility] for c/o 10/10 sharp intermittent RLQ abdominal pain that started yesterday. Per EMS, pt stated he has had small Bms and a small episode of diarrhea. Pt is dialysis pt M, W, Fri. Pt has had 2 episodes of vomiting per EMS. Facility gave pt a suppository at 0400 today. Pt is A&Ox4. VSS   Allergies No Known Allergies  Level of Care/Admitting Diagnosis ED Disposition     ED Disposition  Admit   Condition  --   Comment  Hospital Area: [facility] [[id number]]  Level of Care: Med-Surg [16]  May admit patient to [facility] or [facility] if equivalent level of care is available:: No  Covid Evaluation: Asymptomatic Screening Protocol (No Symptoms)  Diagnosis: Acute cholecystitis [575.0.ICD-9-CM]  Admitting Physician: Lequita Halt [[id number]]  Attending Physician: Lequita Halt [[id number]]  Estimated length of stay: 3 - 4 days  Certification:: I certify this patient will need inpatient services for at least 2 midnights          B Medical/Surgery History Past Medical History:  Diagnosis Date   Anemia    low iron   Aortic stenosis    s/p TAVR 01/19/20   Arthritis    Cancer [facility])    prostate, s/p I-125 seed implant 05/18/05   Colon polyps ~ 1993 and 2003   Dr [name], [facility].  08/2001 colonoscopy: tubular adenoma at cecum.     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   Elevated cholesterol with high triglycerides    ESRD (end stage renal disease) (HCC)    [facility]   GERD (gastroesophageal reflux  disease)    Glaucoma    Hypertension    Legally blind in left eye, as defined in [place]    has pinpoint vision in right eye   PAD (peripheral artery disease) ([place])    RBBB    Past Surgical History:  Procedure Laterality Date   ABDOMINAL AORTOGRAM W/LOWER EXTREMITY N/A 07/25/2020   Procedure: ABDOMINAL AORTOGRAM W/LOWER EXTREMITY;  Surgeon: Marty Heck, MD;  Location: [place] [facility];  Service: Cardiovascular;  Laterality: N/A;   AMPUTATION Left 08/12/2020   Procedure: LEFT BELOW KNEE AMPUTATION;  Surgeon: Marty Heck, MD;  Location: [place];  Service: Vascular;  Laterality: Left;   AV FISTULA PLACEMENT Left 03/20/2018   Procedure: ARTERIOVENOUS (AV) FISTULA CREATION ARM;  Surgeon: [name], MD;  Location: [place];  Service: Vascular;  Laterality: Left;   [place] Left 05/21/2018   Procedure: LEFT BASILIC VEIN FISTULA SECOND STAGE;  Surgeon: [name], MD;  Location: [place];  Service: Vascular;  Laterality: Left;   COLONOSCOPY     COLONOSCOPY WITH PROPOFOL N/A 11/30/2019   Procedure: COLONOSCOPY WITH PROPOFOL;  Surgeon: Ronnette Juniper, MD;  Location: [place];  Service: Gastroenterology;  Laterality: N/A;   GLAUCOMA SURGERY  2019   multiple surgeries   HEMOSTASIS CLIP PLACEMENT  11/30/2019   Procedure: HEMOSTASIS CLIP PLACEMENT;  Surgeon: Ronnette Juniper, MD;  Location: [place];  Service:  Gastroenterology;;   KNEE ARTHROSCOPY     MULTIPLE EXTRACTIONS WITH ALVEOLOPLASTY N/A 12/30/2019   Procedure: MULTIPLE EXTRACTION WITH ALVEOLOPLASTY;  Surgeon: Charlaine Dalton, DMD;  Location: [place];  Service: Dentistry;  Laterality: N/A;   PERIPHERAL VASCULAR INTERVENTION Left 07/25/2020   Procedure: PERIPHERAL VASCULAR INTERVENTION;  Surgeon: Marty Heck, MD;  Location: [place] [facility];  Service: Cardiovascular;  Laterality: Left;  superficial femoral   POLYPECTOMY  11/30/2019   Procedure: POLYPECTOMY;  Surgeon: Ronnette Juniper, MD;   Location: [place];  Service: Gastroenterology;;   PROSTATE SURGERY     RIGHT/LEFT HEART CATH AND CORONARY ANGIOGRAPHY N/A 12/03/2019   Procedure: RIGHT/LEFT HEART CATH AND CORONARY ANGIOGRAPHY;  Surgeon: Burnell Blanks, MD;  Location: [place] [facility];  Service: Cardiovascular;  Laterality: N/A;   SUBMUCOSAL TATTOO INJECTION  11/30/2019   Procedure: SUBMUCOSAL TATTOO INJECTION;  Surgeon: Ronnette Juniper, MD;  Location: [place];  Service: Gastroenterology;;   TEE WITHOUT CARDIOVERSION N/A 01/19/2020   Procedure: TRANSESOPHAGEAL ECHOCARDIOGRAM (TEE);  Surgeon: Burnell Blanks, MD;  Location: [place] [facility];  Service: Open Heart Surgery;  Laterality: N/A;   TRANSCATHETER AORTIC VALVE REPLACEMENT, TRANSFEMORAL Left 01/19/2020   Procedure: TRANSCATHETER AORTIC VALVE REPLACEMENT, LEFT TRANSFEMORAL;  Surgeon: Burnell Blanks, MD;  Location: [place] [facility];  Service: Open Heart Surgery;  Laterality: Left;     A IV Location/Drains/Wounds Patient Lines/Drains/Airways Status     Active Line/Drains/Airways     Name Placement date Placement time Site Days   Peripheral IV 03/15/21 22 G 1.75" Anterior;Right;Distal Forearm 03/15/21  1023  Forearm  less than 1   Fistula / Graft Left Upper arm --  --  Upper arm  --   Incision (Closed) 05/21/18 Arm Left 05/21/18  0900  -- 1029   Incision (Closed) 05/21/18 Arm Left 05/21/18  0900  -- 1029   Incision (Closed) 05/21/18 Arm Left 05/21/18  0943  -- 1029   Incision (Closed) 08/12/20 Leg Left 08/12/20  1138  -- 215            Intake/Output Last 24 hours No intake or output data in the 24 hours ending 03/15/21 1813  Labs/Imaging Results for orders placed or performed during the hospital encounter of 03/15/21 (from the past 48 hour(s))  Comprehensive metabolic panel     Status: Abnormal   Collection Time: 03/15/21  8:55 AM  Result Value Ref Range   Sodium 134 (L) 135 - 145 mmol/L   Potassium 4.8 3.5 - 5.1 mmol/L    Chloride 91 (L) 98 - 111 mmol/L   CO2 23 22 - 32 mmol/L   Glucose, Bld 179 (H) 70 - 99 mg/dL    Comment: Glucose reference range applies only to samples taken after fasting for at least 8 hours.   BUN 28 (H) 8 - 23 mg/dL   Creatinine, Ser 7.25 (H) 0.61 - 1.24 mg/dL   Calcium 9.1 8.9 - 10.3 mg/dL   Total Protein 7.4 6.5 - 8.1 g/dL   Albumin 3.2 (L) 3.5 - 5.0 g/dL   AST 26 15 - 41 U/L   ALT 10 0 - 44 U/L   Alkaline Phosphatase 92 38 - 126 U/L   Total Bilirubin 1.0 0.3 - 1.2 mg/dL   GFR, Estimated 7 (L) >60 mL/min    Comment: (NOTE) Calculated using the CKD-EPI Creatinine Equation (2021)    Anion gap 20 (H) 5 - 15    Comment: Performed at [facility], [address] [place], [place] [address]  Lipase, blood     Status: None   Collection Time: 03/15/21  8:55 AM  Result Value Ref Range   Lipase 20 11 - 51 U/L    Comment: Performed at [facility] [address] [place], [place] [address]  CBC with Diff     Status: Abnormal   Collection Time: 03/15/21  8:55 AM  Result Value Ref Range   WBC 19.1 (H) 4.0 - 10.5 K/uL   RBC 3.96 (L) 4.22 - 5.81 MIL/uL   Hemoglobin 13.8 13.0 - 17.0 g/dL   HCT 42.1 39.0 - 52.0 %   MCV 106.3 (H) 80.0 - 100.0 fL   MCH 34.8 (H) 26.0 - 34.0 pg   MCHC 32.8 30.0 - 36.0 g/dL   RDW 13.9 11.5 - 15.5 %   Platelets 414 (H) 150 - 400 K/uL   nRBC 0.0 0.0 - 0.2 %   Neutrophils Relative % 89 %   Neutro Abs 16.9 (H) 1.7 - 7.7 K/uL   Lymphocytes Relative 3 %   Lymphs Abs 0.6 (L) 0.7 - 4.0 K/uL   Monocytes Relative 7 %   Monocytes Absolute 1.4 (H) 0.1 - 1.0 K/uL   Eosinophils Relative 0 %   Eosinophils Absolute 0.0 0.0 - 0.5 K/uL   Basophils Relative 0 %   Basophils Absolute 0.0 0.0 - 0.1 K/uL   Immature Granulocytes 1 %   Abs Immature Granulocytes 0.12 (H) 0.00 - 0.07 K/uL    Comment: Performed at [facility], [place] [address], [place], [place] [address]  Resp Panel by RT-PCR (Flu A&B, Covid) Nasopharyngeal Swab     Status: None    Collection Time: 03/15/21  1:35 PM   Specimen: Nasopharyngeal Swab; Nasopharyngeal(NP) swabs in vial transport medium  Result Value Ref Range   SARS Coronavirus 2 by RT PCR NEGATIVE NEGATIVE    Comment: (NOTE) SARS-CoV-2 target nucleic acids are NOT DETECTED.  The SARS-CoV-2 RNA is generally detectable in upper respiratory specimens during the acute phase of infection. The lowest concentration of SARS-CoV-2 viral copies this assay can detect is 138 copies/mL. A negative result does not preclude SARS-Cov-2 infection and should not be used as the sole basis for treatment or other patient management decisions. A negative result may occur with  improper specimen collection/handling, submission of specimen other than nasopharyngeal swab, presence of viral mutation(s) within the areas targeted by this assay, and inadequate number of viral copies(<138 copies/mL). A negative result must be combined with clinical observations, patient history, and epidemiological information. The expected result is Negative.  Fact Sheet for Patients:  EntrepreneurPulse.com.au  Fact Sheet for Healthcare Providers:  IncredibleEmployment.be  This test is no t yet approved or cleared by the [place] FDA and  has been authorized for detection and/or diagnosis of SARS-CoV-2 by FDA under an Emergency Use Authorization (EUA). This EUA will remain  in effect (meaning this test can be used) for the duration of the COVID-19 declaration under Section 564(b)(1) of the Act, 21 U.S.C.section 360bbb-3(b)(1), unless the authorization is terminated  or revoked sooner.       Influenza A by PCR NEGATIVE NEGATIVE   Influenza B by PCR NEGATIVE NEGATIVE    Comment: (NOTE) The Xpert Xpress SARS-CoV-2/FLU/RSV plus assay is intended as an aid in the diagnosis of influenza from Nasopharyngeal swab specimens and should not be used as a sole basis for treatment. Nasal washings  and aspirates are unacceptable for Xpert Xpress SARS-CoV-2/FLU/RSV  testing.  Fact Sheet for Patients: EntrepreneurPulse.com.au  Fact Sheet for Healthcare Providers: IncredibleEmployment.be  This test is not yet approved or cleared by the [place] FDA and has been authorized for detection and/or diagnosis of SARS-CoV-2 by FDA under an Emergency Use Authorization (EUA). This EUA will remain in effect (meaning this test can be used) for the duration of the COVID-19 declaration under Section 564(b)(1) of the Act, 21 U.S.C. section 360bbb-3(b)(1), unless the authorization is terminated or revoked.  Performed at [facility], [place] [address], [place], [place] [address]    DG Chest 1 View  Result Date: 03/15/2021 CLINICAL DATA:  Abdominal pain and vomiting. Clinical concern for aspiration. EXAM: CHEST  1 VIEW COMPARISON:  06/24/2020 FINDINGS: Normal sized heart. Interval minimal patchy density at the right lung base. The left lung remains clear. Aortic valve stent. Thoracic spine degenerative changes. Left axillary surgical clip. IMPRESSION: Minimal right basilar atelectasis, pneumonia or aspiration pneumonitis. Electronically Signed   By: Claudie Revering M.D.   On: 03/15/2021 14:07   US Abdomen Limited  Result Date: 03/15/2021 CLINICAL DATA:  Right upper quadrant pain EXAM: ULTRASOUND ABDOMEN LIMITED RIGHT UPPER QUADRANT COMPARISON:  None. FINDINGS: Gallbladder: Distended with sludge and calculi measuring up to 4 mm. No wall thickening visualized. No pericholecystic fluid. No sonographic Murphy sign noted by sonographer. Common bile duct: Diameter: 3 mm, normal Liver: Left lobe of liver is poorly visualized due to bowel gas. No focal lesion identified. Within normal limits in parenchymal echogenicity. Portal vein is patent on color Doppler imaging with normal direction of blood flow towards the liver. Other: None. IMPRESSION: Distended gallbladder with  sludge and calculi but no additional sonographic evidence of acute cholecystitis. Electronically Signed   By: Macy Mis M.D.   On: 03/15/2021 11:40   CT Renal Stone Study  Result Date: 03/15/2021 CLINICAL DATA:  Right abdominal pain. EXAM: CT ABDOMEN AND PELVIS WITHOUT CONTRAST TECHNIQUE: Multidetector CT imaging of the abdomen and pelvis was performed following the standard protocol without IV contrast. RADIATION DOSE REDUCTION: This exam was performed according to the departmental dose-optimization program which includes automated exposure control, adjustment of the mA and/or kV according to patient size and/or use of iterative reconstruction technique. COMPARISON:  CTA abdomen and pelvis 12/01/2019 FINDINGS: Lower chest: Trace right pleural fluid is new from prior. Unchanged mild bibasilar bronchiectasis. Mild dependent right lower lobe ground-glass likely subsegmental atelectasis. A partially visualized ascending aortic endograft. Dense coronary artery calcifications are seen. No pericardial effusion. Lack of intra-articular fluid limits evaluation of the abdominal and pelvic organ parenchyma. The following findings are made within this limitation. Hepatobiliary: Smooth liver contours. No gross liver lesion is seen. The gallbladder is again moderately distended with numerous layering small gallstones, similar to prior. No definite gallbladder wall thickening. Pancreas: Unremarkable. No pancreatic ductal dilatation or surrounding inflammatory changes. Spleen: Normal in size without focal abnormality. Adrenals/Urinary Tract: Left adrenal 2.6 cm nodule is unchanged from 11/26/2019 with internal density of 15 Hounsfield units. Right adrenal 15 mm nodule is unchanged in size with internal density of 5 Hounsfield units suggesting a benign lipid rich adenoma. These both again are likely benign. Mild left-greater-than-right renal pelvis vascular calcifications. Mild bilateral renal cortical thinning is again  seen. Bilateral moderate extrarenal pelves are again noted. There is again mild-to-moderate dilatation of the bilateral ureters diffusely down to the level of the ureteropelvic junction likely chronic. No obstructing renal stone is seen. No hydronephrosis. Within the limitations of lack of IV contrast,  no contour deforming renal mass. The urinary bladder is grossly unremarkable. Stomach/Bowel: No bowel wall thickening. The terminal ileum is unremarkable. The appendix is within normal limits. No dilated loops of bowel to indicate bowel obstruction. Vascular/Lymphatic: No abdominal aortic aneurysm. Dense vascular calcifications. No enlarged abdominal or pelvic lymph nodes. 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 Other: Small fat containing umbilical hernia, unchanged. Unchanged small fat containing left inguinal hernia. No free air or free fluid. Musculoskeletal: Mild multilevel degenerative disc changes. Old healed lateral right eleventh rib fracture unchanged. IMPRESSION: 1. No nephroureterolithiasis or hydronephrosis. 2. Cholelithiasis. 3. 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 4. Small fat containing umbilical and left inguinal hernias. 5. Trace right pleural effusion with mild subsegmental atelectasis. 6.  Aortic Atherosclerosis (ICD10-I70.0). Electronically Signed   By: Yvonne Kendall   On: 03/15/2021 11:14    Pending Labs Unresulted Labs (From admission, onward)     Start     Ordered   03/16/21 0500  CBC  Tomorrow morning,   R        03/15/21 1308   03/16/21 0500  Comprehensive metabolic panel  Tomorrow morning,   R        03/15/21 1445   03/15/21 0831  Urinalysis, Routine w reflex microscopic  Once,   STAT        03/15/21 0831            Vitals/Pain Today's Vitals   03/15/21 1430 03/15/21 1430 03/15/21 1715 03/15/21 1800  BP: 140/69  128/76 128/75  Pulse: 79  90   Resp: 14  16 12   Temp:      TempSrc:       SpO2: 96%  98%   Weight:      Height:      PainSc:  9       Isolation Precautions No active isolations  Medications Medications  morphine 2 MG/ML injection 2 mg (has no administration in time range)  acetaminophen (TYLENOL) tablet 1,000 mg (has no administration in time range)  aspirin EC tablet 81 mg (has no administration in time range)  atorvastatin (LIPITOR) tablet 10 mg (has no administration in time range)  ferric citrate (AURYXIA) tablet 420 mg (420 mg Oral Given 03/15/21 1649)  docusate sodium (COLACE) capsule 100 mg (has no administration in time range)  pantoprazole (PROTONIX) EC tablet 40 mg (has no administration in time range)  polyethylene glycol (MIRALAX / GLYCOLAX) packet 17 g (has no administration in time range)  carbidopa-levodopa (SINEMET IR) 25-100 MG per tablet immediate release 1 tablet (1 tablet Oral Given 03/15/21 1650)  gabapentin (NEURONTIN) capsule 100 mg (100 mg Oral Given 03/15/21 1649)  brimonidine (ALPHAGAN) 0.15 % ophthalmic solution 1 drop (has no administration in time range)  dorzolamide-timolol (COSOPT) 22.3-6.8 MG/ML ophthalmic solution 1 drop (has no administration in time range)  pilocarpine (PILOCAR) 4 % ophthalmic solution 1 drop (has no administration in time range)  Netarsudil-Latanoprost 0.02-0.005 % SOLN 1 drop (has no administration in time range)  heparin injection 5,000 Units (5,000 Units Subcutaneous Given 03/15/21 1349)  HYDROmorphone (DILAUDID) injection 0.5-1 mg (0.5 mg Intravenous Given 03/15/21 1649)  ondansetron (ZOFRAN) tablet 4 mg ( Oral See Alternative 03/15/21 1344)    Or  ondansetron (ZOFRAN) injection 4 mg (4 mg Intravenous Given 03/15/21 1344)  hydrALAZINE (APRESOLINE) tablet 25 mg (has no administration in time range)  piperacillin-tazobactam (ZOSYN) IVPB 2.25 g (2.25 g Intravenous New Bag/Given 03/15/21 1433)  ipratropium-albuterol (DUONEB) 0.5-2.5 (3) MG/3ML  nebulizer solution 3 mL (has no administration in time range)   fentaNYL (SUBLIMAZE) injection 40 mcg (40 mcg Intravenous Given 03/15/21 1023)  fentaNYL (SUBLIMAZE) injection 50 mcg (50 mcg Intravenous Given 03/15/21 1229)    Mobility walks with device High fall risk    R Recommendations: See Admitting Provider Note  Report given to: Lennie Hummer RN  Additional Notes:

## 2021-03-15 NOTE — H&P (Signed)
History and Physical    Elijah White PVV:[id number] DOB: 08/05/1940 DOA: 03/15/2021  PCP: Maryella Shivers, MD (Confirm with patient/family/NH records and if not entered, this has to be entered at [facility] point of entry) Patient coming from: [facility]  I have personally briefly reviewed patient's old medical records in [place]  Chief Complaint: Belly hurts  HPI: Elijah White is a 81 y.o. male with medical history significant of ESRD on HD MWF, aortic stenosis status post TAVR 2021, nonobstructive CAD, Parkinson's disease, PVD on Plavix, s/p left BKA, HTN, IIDM, HLD,  came with recurrent right upper quadrant abdominal pain.  Symptoms started last night, woke up with 10/10 cramping-like RUQ abdominal pain, nonradiating, associated with nauseous.  Episodic, each episode last 30 to 40 minutes, used some OTC pain medications with minimum help.  Overnight, to throw up of stomach content, could not tell the color of the vomitus given the poor eyesight at baseline.  No diarrhea, denied any fever or chills.  Denies any chest pain, no shortness of breath.  Denied any such abdominal pain in the past.  ED Course: Afebrile, no tachycardia.  RUQ ultrasound showed cholelithiasis, LFTs within normal limits.  WBC 19.1, creatinine 7.2, BUN 28, K4.5.  Patient continues to experience severe RUQ abdominal pain, fentanyl x2 given.  Review of Systems: As per HPI otherwise 14 point review of systems negative.    Past Medical History:  Diagnosis Date   Anemia    low iron   Aortic stenosis    s/p TAVR 01/19/20   Arthritis    Cancer [facility])    prostate, s/p I-125 seed implant 05/18/05   Colon polyps ~ 1993 and 2003   Dr [name], [facility].  08/2001 colonoscopy: tubular adenoma at cecum.     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   Elevated cholesterol with high triglycerides    ESRD (end stage renal disease) (HCC)    [facility]   GERD  (gastroesophageal reflux disease)    Glaucoma    Hypertension    Legally blind in left eye, as defined in [place]    has pinpoint vision in right eye   PAD (peripheral artery disease) ([place])    RBBB     Past Surgical History:  Procedure Laterality Date   ABDOMINAL AORTOGRAM W/LOWER EXTREMITY N/A 07/25/2020   Procedure: ABDOMINAL AORTOGRAM W/LOWER EXTREMITY;  Surgeon: Marty Heck, MD;  Location: [place] [facility];  Service: Cardiovascular;  Laterality: N/A;   AMPUTATION Left 08/12/2020   Procedure: LEFT BELOW KNEE AMPUTATION;  Surgeon: Marty Heck, MD;  Location: [place];  Service: Vascular;  Laterality: Left;   AV FISTULA PLACEMENT Left 03/20/2018   Procedure: ARTERIOVENOUS (AV) FISTULA CREATION ARM;  Surgeon: [name], MD;  Location: [place];  Service: Vascular;  Laterality: Left;   [place] Left 05/21/2018   Procedure: LEFT BASILIC VEIN FISTULA SECOND STAGE;  Surgeon: [name], MD;  Location: [place];  Service: Vascular;  Laterality: Left;   COLONOSCOPY     COLONOSCOPY WITH PROPOFOL N/A 11/30/2019   Procedure: COLONOSCOPY WITH PROPOFOL;  Surgeon: Ronnette Juniper, MD;  Location: [place];  Service: Gastroenterology;  Laterality: N/A;   GLAUCOMA SURGERY  2019   multiple surgeries   HEMOSTASIS CLIP PLACEMENT  11/30/2019   Procedure: HEMOSTASIS CLIP PLACEMENT;  Surgeon: Ronnette Juniper, MD;  Location: [facility];  Service: Gastroenterology;;   KNEE ARTHROSCOPY  MULTIPLE EXTRACTIONS WITH ALVEOLOPLASTY N/A 12/30/2019   Procedure: MULTIPLE EXTRACTION WITH ALVEOLOPLASTY;  Surgeon: Charlaine Dalton, DMD;  Location: [place];  Service: Dentistry;  Laterality: N/A;   PERIPHERAL VASCULAR INTERVENTION Left 07/25/2020   Procedure: PERIPHERAL VASCULAR INTERVENTION;  Surgeon: Marty Heck, MD;  Location: [place] [facility];  Service: Cardiovascular;  Laterality: Left;  superficial femoral   POLYPECTOMY  11/30/2019   Procedure: POLYPECTOMY;   Surgeon: Ronnette Juniper, MD;  Location: [place];  Service: Gastroenterology;;   PROSTATE SURGERY     RIGHT/LEFT HEART CATH AND CORONARY ANGIOGRAPHY N/A 12/03/2019   Procedure: RIGHT/LEFT HEART CATH AND CORONARY ANGIOGRAPHY;  Surgeon: Burnell Blanks, MD;  Location: [place] [facility];  Service: Cardiovascular;  Laterality: N/A;   SUBMUCOSAL TATTOO INJECTION  11/30/2019   Procedure: SUBMUCOSAL TATTOO INJECTION;  Surgeon: Ronnette Juniper, MD;  Location: [place];  Service: Gastroenterology;;   TEE WITHOUT CARDIOVERSION N/A 01/19/2020   Procedure: TRANSESOPHAGEAL ECHOCARDIOGRAM (TEE);  Surgeon: Burnell Blanks, MD;  Location: [place] [facility];  Service: Open Heart Surgery;  Laterality: N/A;   TRANSCATHETER AORTIC VALVE REPLACEMENT, TRANSFEMORAL Left 01/19/2020   Procedure: TRANSCATHETER AORTIC VALVE REPLACEMENT, LEFT TRANSFEMORAL;  Surgeon: Burnell Blanks, MD;  Location: [place] [facility];  Service: Open Heart Surgery;  Laterality: Left;     reports that he quit smoking about 2 years ago. His smoking use included cigarettes. He smoked an average of .5 packs per day. He has never used smokeless tobacco. He reports that he does not drink alcohol and does not use drugs.  No Known Allergies  Family History  Problem Relation Age of Onset   CAD Mother      Prior to Admission medications   Medication Sig Start Date End Date Taking? Authorizing Provider  acetaminophen (TYLENOL) 500 MG tablet Take 1,000 mg by mouth every 6 (six) hours as needed for moderate pain or headache.   Yes [provider]  aspirin EC 81 MG tablet Take 81 mg by mouth in the morning. Swallow whole.   Yes [provider]  atorvastatin (LIPITOR) 10 MG tablet Take 1 tablet (10 mg total) by mouth in the morning. 08/16/20  Yes Rhyne, Samantha J, PA-C  AURYXIA 1 GM 210 MG(Fe) tablet Take 420 mg by mouth 3 (three) times daily with meals. 05/30/20  Yes [provider]  B  Complex-C-Folic Acid (DIALYVITE 585) 0.8 MG TABS Take 1 tablet by mouth in the morning. 05/08/19  Yes [provider]  brimonidine (ALPHAGAN) 0.15 % ophthalmic solution Place 1 drop into the right eye 3 (three) times daily.   Yes [provider]  carbidopa-levodopa (SINEMET) 25-100 MG tablet Take 1 tablet by mouth 3 (three) times daily. Patient taking differently: Take 1 tablet by mouth 3 (three) times daily. 1 TABLET @ 8am/ 12pm/ 4pm 10/19/20 03/15/21 Yes Curatolo, Adam, DO  clopidogrel (PLAVIX) 75 MG tablet Take 1 tablet (75 mg total) by mouth daily with breakfast. 07/25/20  Yes Marty Heck, MD  docusate sodium (COLACE) 100 MG capsule Take 100 mg by mouth 2 (two) times daily.   Yes [provider]  dorzolamide-timolol (COSOPT) 22.3-6.8 MG/ML ophthalmic solution Place 1 drop into the right eye 2 (two) times daily. 06/25/17  Yes [provider]  gabapentin (NEURONTIN) 100 MG capsule Take 1 capsule (100 mg total) by mouth daily. 10/19/20 03/15/21 Yes Curatolo, Adam, DO  isosorbide mononitrate (IMDUR) 30 MG 24 hr tablet Take 1 tablet (30 mg total) by mouth  in the morning. 08/16/20  Yes Rhyne, Hulen Shouts, PA-C  Melatonin 5 MG CAPS Take 5 mg by mouth at bedtime.   Yes [provider]  pantoprazole (PROTONIX) 40 MG tablet Take 1 tablet (40 mg total) by mouth daily at 12 noon. 08/16/20  Yes Rhyne, Hulen Shouts, PA-C  pilocarpine (PILOCAR) 4 % ophthalmic solution Place 1 drop into the right eye 4 (four) times daily.    Yes [provider]  polyethylene glycol (MIRALAX / GLYCOLAX) 17 g packet Take 17 g by mouth at bedtime.   Yes [provider]  ROCKLATAN 0.02-0.005 % SOLN Place 1 drop into the right eye at bedtime. 06/20/20  Yes [provider]  blood glucose meter kit and supplies KIT Dispense based on patient and insurance preference. Use up to four times daily as directed. (FOR ICD-9 250.00, 250.01). For QAC - HS accuchecks. 03/30/18    Thurnell Lose, MD  carvedilol (COREG) 3.125 MG tablet Take 1 tablet (3.125 mg total) by mouth 2 (two) times daily with a meal. Patient not taking: Reported on 03/15/2021 08/16/20   Gabriel Earing, PA-C  Doxercalciferol (HECTOROL IV) Doxercalciferol (Hectorol) 08/09/20 08/28/21  [provider]  Lancets (ONETOUCH DELICA PLUS [id number]) [place]  03/31/18   [provider]  Methoxy PEG-Epoetin Beta (MIRCERA IJ) Mircera 09/05/20 09/04/21  [provider]  ONETOUCH VERIO test strip USE AS DIRECTED TO TEST FOUR TIMES A DAY 07/31/18   [name], MD  oxyCODONE-acetaminophen (PERCOCET) 5-325 MG tablet Take 1 tablet by mouth every 6 (six) hours as needed (pain). Patient not taking: Reported on 03/15/2021 08/19/20   Karoline Caldwell, PA-C    Physical Exam: Vitals:   03/15/21 1030 03/15/21 1045 03/15/21 1100 03/15/21 1211  BP: 134/74 131/73 124/60 (!) 146/71  Pulse: 73 78 (!) 38 71  Resp: 17 19 (!) 27 (!) 22  Temp:      TempSrc:      SpO2:  95% 93% 96%  Weight:      Height:        Constitutional: NAD, calm, comfortable Vitals:   03/15/21 1030 03/15/21 1045 03/15/21 1100 03/15/21 1211  BP: 134/74 131/73 124/60 (!) 146/71  Pulse: 73 78 (!) 38 71  Resp: 17 19 (!) 27 (!) 22  Temp:      TempSrc:      SpO2:  95% 93% 96%  Weight:      Height:       Eyes: Left eye blind, lids and conjunctivae normal ENMT: Mucous membranes are moist. Posterior pharynx clear of any exudate or lesions.Normal dentition.  Neck: normal, supple, no masses, no thyromegaly Respiratory: clear to auscultation bilaterally, no wheezing, no crackles. Normal respiratory effort. No accessory muscle use.  Cardiovascular: Regular rate and rhythm, no murmurs / rubs / gallops. No extremity edema. 2+ pedal pulses. No carotid bruits.  Abdomen: severe tenderness on RUQ with guarding, no masses palpated. No hepatosplenomegaly. Bowel sounds positive.  Musculoskeletal: no clubbing / cyanosis. No joint deformity  upper and lower extremities. Good ROM, no contractures. Normal muscle tone.  Skin: no rashes, lesions, ulcers. No induration Neurologic: CN 2-12 grossly intact. Sensation intact, DTR normal. Strength 5/5 in all 4.  Psychiatric: Normal judgment and insight. Alert and oriented x 3. Normal mood.     Labs on Admission: I have personally reviewed following labs and imaging studies  CBC: Recent Labs  Lab 03/15/21 0855  WBC 19.1*  NEUTROABS 16.9*  HGB 13.8  HCT 42.1  MCV  106.3*  °PLT 414*  ° °Basic Metabolic Panel: °Recent Labs  °Lab 03/15/21 °0855  °NA 134*  °K 4.8  °CL 91*  °CO2 23  °GLUCOSE 179*  °BUN 28*  °CREATININE 7.25*  °CALCIUM 9.1  ° °GFR: °Estimated Creatinine Clearance: 6.8 mL/min (A) (by C-G formula based on SCr of 7.25 mg/dL (H)). °Liver Function Tests: °Recent Labs  °Lab 03/15/21 °0855  °AST 26  °ALT 10  °ALKPHOS 92  °BILITOT 1.0  °PROT 7.4  °ALBUMIN 3.2*  ° °Recent Labs  °Lab 03/15/21 °0855  °LIPASE 20  ° °No results for input(s): AMMONIA in the last 168 hours. °Coagulation Profile: °No results for input(s): INR, PROTIME in the last 168 hours. °Cardiac Enzymes: °No results for input(s): CKTOTAL, CKMB, CKMBINDEX, TROPONINI in the last 168 hours. °BNP (last 3 results) °No results for input(s): PROBNP in the last 8760 hours. °HbA1C: °No results for input(s): HGBA1C in the last 72 hours. °CBG: °No results for input(s): GLUCAP in the last 168 hours. °Lipid Profile: °No results for input(s): CHOL, HDL, LDLCALC, TRIG, CHOLHDL, LDLDIRECT in the last 72 hours. °Thyroid Function Tests: °No results for input(s): TSH, T4TOTAL, FREET4, T3FREE, THYROIDAB in the last 72 hours. °Anemia Panel: °No results for input(s): VITAMINB12, FOLATE, FERRITIN, TIBC, IRON, RETICCTPCT in the last 72 hours. °Urine analysis: °   °Component Value Date/Time  ° COLORURINE YELLOW 08/03/2020 1403  ° APPEARANCEUR CLEAR 08/03/2020 1403  ° LABSPEC 1.012 08/03/2020 1403  ° PHURINE 9.0 (H) 08/03/2020 1403  ° GLUCOSEU NEGATIVE  08/03/2020 1403  ° HGBUR NEGATIVE 08/03/2020 1403  ° BILIRUBINUR NEGATIVE 08/03/2020 1403  ° BILIRUBINUR negative 02/19/2018 1643  ° KETONESUR NEGATIVE 08/03/2020 1403  ° PROTEINUR 100 (A) 08/03/2020 1403  ° UROBILINOGEN 0.2 02/19/2018 1643  ° UROBILINOGEN 1.0 08/22/2012 1402  ° NITRITE NEGATIVE 08/03/2020 1403  ° LEUKOCYTESUR NEGATIVE 08/03/2020 1403  ° ° °Radiological Exams on Admission: °US Abdomen Limited ° °Result Date: 03/15/2021 °CLINICAL DATA:  Right upper quadrant pain EXAM: ULTRASOUND ABDOMEN LIMITED RIGHT UPPER QUADRANT COMPARISON:  None. FINDINGS: Gallbladder: Distended with sludge and calculi measuring up to 4 mm. No wall thickening visualized. No pericholecystic fluid. No sonographic Murphy sign noted by sonographer. Common bile duct: Diameter: 3 mm, normal Liver: Left lobe of liver is poorly visualized due to bowel gas. No focal lesion identified. Within normal limits in parenchymal echogenicity. Portal vein is patent on color Doppler imaging with normal direction of blood flow towards the liver. Other: None. IMPRESSION: Distended gallbladder with sludge and calculi but no additional sonographic evidence of acute cholecystitis. Electronically Signed   By: Praneil  Patel M.D.   On: 03/15/2021 11:40  ° °CT Renal Stone Study ° °Result Date: 03/15/2021 °CLINICAL DATA:  Right abdominal pain. EXAM: CT ABDOMEN AND PELVIS WITHOUT CONTRAST TECHNIQUE: Multidetector CT imaging of the abdomen and pelvis was performed following the standard protocol without IV contrast. RADIATION DOSE REDUCTION: This exam was performed according to the departmental dose-optimization program which includes automated exposure control, adjustment of the mA and/or kV according to patient size and/or use of iterative reconstruction technique. COMPARISON:  CTA abdomen and pelvis 12/01/2019 FINDINGS: Lower chest: Trace right pleural fluid is new from prior. Unchanged mild bibasilar bronchiectasis. Mild dependent right lower lobe  ground-glass likely subsegmental atelectasis. A partially visualized ascending aortic endograft. Dense coronary artery calcifications are seen. No pericardial effusion. Lack of intra-articular fluid limits evaluation of the abdominal and pelvic organ parenchyma. The following findings are made within this limitation. Hepatobiliary: Smooth liver contours. No   gross liver lesion is seen. The gallbladder is again moderately distended with numerous layering small gallstones, similar to prior. No definite gallbladder wall thickening. Pancreas: Unremarkable. No pancreatic ductal dilatation or surrounding inflammatory changes. Spleen: Normal in size without focal abnormality. Adrenals/Urinary Tract: Left adrenal 2.6 cm nodule is unchanged from 11/26/2019 with internal density of 15 Hounsfield units. Right adrenal 15 mm nodule is unchanged in size with internal density of 5 Hounsfield units suggesting a benign lipid rich adenoma. These both again are likely benign. Mild left-greater-than-right renal pelvis vascular calcifications. Mild bilateral renal cortical thinning is again seen. Bilateral moderate extrarenal pelves are again noted. There is again mild-to-moderate dilatation of the bilateral ureters diffusely down to the level of the ureteropelvic junction likely chronic. No obstructing renal stone is seen. No hydronephrosis. Within the limitations of lack of IV contrast, no contour deforming renal mass. The urinary bladder is grossly unremarkable. Stomach/Bowel: No bowel wall thickening. The terminal ileum is unremarkable. The appendix is within normal limits. No dilated loops of bowel to indicate bowel obstruction. Vascular/Lymphatic: No abdominal aortic aneurysm. Dense vascular calcifications. No enlarged abdominal or pelvic lymph nodes. 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 Other: Small fat containing umbilical hernia, unchanged. Unchanged small fat containing left  inguinal hernia. No free air or free fluid. Musculoskeletal: Mild multilevel degenerative disc changes. Old healed lateral right eleventh rib fracture unchanged. IMPRESSION: 1. No nephroureterolithiasis or hydronephrosis. 2. Cholelithiasis. 3. 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 4. Small fat containing umbilical and left inguinal hernias. 5. Trace right pleural effusion with mild subsegmental atelectasis. 6.  Aortic Atherosclerosis (ICD10-I70.0). Electronically Signed   By: Ronald  Viola   On: 03/15/2021 11:14   ° °EKG: Independently reviewed. Sinus, chronic RBBB. ° °Assessment/Plan °Active Problems: °  * No active hospital problems. * ° (please populate well all problems here in Problem List. (For example, if patient is on BP meds at home and you resume or decide to hold them, it is a problem that needs to be her. Same for CAD, COPD, HLD and so on) ° °Acute RUQ abdominal pain °-Secondary to cholelithiasis, suspect early acute cholecystitis with clinical Murphy sign positive. °-Start antibiotics Unasyn, consult pharmacy. °-D/W PA at bedside, who will discuss with surgical attending, family informed. °-N.p.o. with allow ice chips for now, and pain control.  Given the status of ESRD on HD, will keep patient n.p.o. but no maintenance IV fluid. °-Hx of CAD with cardiac cath 11/2019 showing nonobstructive CAD, aortic stenosis corrected with TAVR in 01/2020, normal echo recently.  Medically cleared for cholecystectomy surgery with generalized anesthesia with acceptable cardiac risk. °-Hold Plavix, continue aspirin. ° °ESRD on HD °-Euvolemic, nephrology consulted. ° °HTN °-Hold home BP meds, as needed hydralazine for now. ° °Parkinson's disease °-Stable, continue Sinemet ° °Diabetic neuropathy °-Continue gabapentin ° °DVT prophylaxis: Heparin subcu °Code Status: DNR °Family Communication: Daughter and son over the phone °Disposition Plan: Expect more than 2 midnight hospital stay,  likely will need surgical intervention. °Consults called: General surgery, nephrology. °Admission status: MedSurg admission ° ° ° T  MD °[facility] °Pager [phone] ° ° ° °03/15/2021, 12:50 PM  °  °

## 2021-03-15 NOTE — ED Notes (Signed)
Patient transported to CT 

## 2021-03-15 NOTE — ED Notes (Signed)
Got patient on the monitor did ekg got patient some warm blankets patient is resting with call bell in reach

## 2021-03-15 NOTE — ED Notes (Signed)
Patient transported to Ultrasound 

## 2021-03-15 NOTE — ED Notes (Signed)
Pt's HOB now at 90 degrees and will stay that way. Pt vomited again.

## 2021-03-15 NOTE — ED Notes (Signed)
Help get patient off the bed pan cleaned up and patient resting ith call bell in reach

## 2021-03-15 NOTE — Progress Notes (Signed)
Pharmacy Antibiotic Note  Elijah White is a 81 y.o. male admitted on 03/15/2021 with  intra-abdominal infection .  Pharmacy has been consulted for zosyn dosing.  Patient with a history of HTN, HLD, ESRD on HD (M/W/F), DM2, Aortic Stenosis s/p TAVR on Plavix, L BKA, CHF, Parkinson's, and remote hx of prostate cancer. Patient presenting with abdominal pain.  WBC 19.1; T 97.4 F  Plan: Zosyn 2.25g q8h Trend WBC, Fever, Renal function, & Clinical course F/u cultures, clinical course, WBC, fever De-escalate when able F/u Nephrology plan  Height: 5\' 5"  (165.1 cm) Weight: 64.9 kg (143 lb 1.3 oz) IBW/kg (Calculated) : 61.5  Temp (24hrs), Avg:97.4 F (36.3 C), Min:97.4 F (36.3 C), Max:97.4 F (36.3 C)  Recent Labs  Lab 03/15/21 0855  WBC 19.1*  CREATININE 7.25*    Estimated Creatinine Clearance: 6.8 mL/min (A) (by C-G formula based on SCr of 7.25 mg/dL (H)).    No Known Allergies  Antimicrobials this admission: zosyn 1/11 >>   Microbiology results: Pending  Thank you for allowing pharmacy to be a part of this patients care.  Lorelei Pont, PharmD, BCPS 03/15/2021 1:27 PM ED Clinical Pharmacist -  [phone]

## 2021-03-15 NOTE — Consult Note (Signed)
[facility] Consult Note  TAGGART PRASAD February 14, 1941  [id number].    Requesting MD: Dr. Elnora Morrison Chief Complaint/Reason for Consult: Abdominal pain, Cholelithiasis   HPI: Elijah White is a 81 y.o. 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   Patient reports that yesterday he began having intermittent right-sided abdominal pain yesterday morning that gradually became more constant and severe. The pain is somewhat diffuse but worst in the RUQ. He notes associated nausea initially with associated emesis x 1 this morning. Pain is worse with deep inspiration and PO intake. No associated fever, chills or diarrhea.  He resides at [facility] center.  Was reported to be given a stool softener and suppository without any relief. No hx of similar symptoms in the past.   In the ED patient was afebrile, without tachycardia or hypotension. WBC 19.1. LFT's and Lipase wnl. He underwent CT renal stone study that showed cholelithiasis without evidence of acute cholecystitis.  This also showed small fat-containing umbilical and left inguinal hernias and a trace right pleural effusion.  RUQ ultrasound was obtained that showed distended gallbladder with sludge and cholelithiasis.  There was no gallbladder wall thickening or pericholecystic fluid.  CBD within normal limits at 3 mm. Patient was given fentanyl 4mcg at 945 and 106mcg at 1230pm with continued pain. We were asked to see for consultation. TRH to admit.   He has had nothing to eat/drink today.  Abdominal surgical history: none Last colonoscopy: 11/30/19 Anticoagulants: Plavix  Former smoker No alcohol use  Employment: Retired  Mobilizes via wheelchair  ROS: Review of Systems  Constitutional:  Negative for chills and fever.   Gastrointestinal:  Positive for abdominal pain, nausea and vomiting.  All other systems reviewed and are negative. All systems reviewed and otherwise negative except for as above  Family History  Problem Relation Age of Onset   CAD Mother     Past Medical History:  Diagnosis Date   Anemia    low iron   Aortic stenosis    s/p TAVR 01/19/20   Arthritis    Cancer ([place])    prostate, s/p I-125 seed implant 05/18/05   Colon polyps ~ 1993 and 2003   Dr [name], [facility].  08/2001 colonoscopy: tubular adenoma at cecum.     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   Elevated cholesterol with high triglycerides    ESRD (end stage renal disease) (HCC)    [facility]   GERD (gastroesophageal reflux disease)    Glaucoma    Hypertension    Legally blind in left eye, as defined in [place]    has pinpoint vision in right eye   PAD (peripheral artery disease) ([place])    RBBB     Past Surgical History:  Procedure Laterality Date   ABDOMINAL AORTOGRAM W/LOWER EXTREMITY N/A 07/25/2020   Procedure: ABDOMINAL AORTOGRAM W/LOWER EXTREMITY;  Surgeon: Marty Heck, MD;  Location: [place] [facility];  Service: Cardiovascular;  Laterality: N/A;   AMPUTATION Left 08/12/2020   Procedure: LEFT BELOW KNEE AMPUTATION;  Surgeon: Marty Heck, MD;  Location: [place];  Service: Vascular;  Laterality: Left;   AV FISTULA PLACEMENT Left 03/20/2018   Procedure: ARTERIOVENOUS (AV) FISTULA CREATION  ARM;  Surgeon: [name], MD;  Location: [place];  Service: Vascular;  Laterality: Left;   [place] Left 05/21/2018   Procedure: LEFT BASILIC VEIN FISTULA SECOND STAGE;  Surgeon: [name], MD;  Location: [place];  Service: Vascular;  Laterality: Left;   COLONOSCOPY     COLONOSCOPY WITH PROPOFOL N/A 11/30/2019   Procedure: COLONOSCOPY WITH PROPOFOL;  Surgeon: Ronnette Juniper, MD;  Location: [place];  Service:  Gastroenterology;  Laterality: N/A;   GLAUCOMA SURGERY  2019   multiple surgeries   HEMOSTASIS CLIP PLACEMENT  11/30/2019   Procedure: HEMOSTASIS CLIP PLACEMENT;  Surgeon: Ronnette Juniper, MD;  Location: [facility];  Service: Gastroenterology;;   KNEE ARTHROSCOPY     MULTIPLE EXTRACTIONS WITH ALVEOLOPLASTY N/A 12/30/2019   Procedure: MULTIPLE EXTRACTION WITH ALVEOLOPLASTY;  Surgeon: Charlaine Dalton, DMD;  Location: [place];  Service: Dentistry;  Laterality: N/A;   PERIPHERAL VASCULAR INTERVENTION Left 07/25/2020   Procedure: PERIPHERAL VASCULAR INTERVENTION;  Surgeon: Marty Heck, MD;  Location: [place] [facility];  Service: Cardiovascular;  Laterality: Left;  superficial femoral   POLYPECTOMY  11/30/2019   Procedure: POLYPECTOMY;  Surgeon: Ronnette Juniper, MD;  Location: [place];  Service: Gastroenterology;;   PROSTATE SURGERY     RIGHT/LEFT HEART CATH AND CORONARY ANGIOGRAPHY N/A 12/03/2019   Procedure: RIGHT/LEFT HEART CATH AND CORONARY ANGIOGRAPHY;  Surgeon: Burnell Blanks, MD;  Location: [place] [facility];  Service: Cardiovascular;  Laterality: N/A;   SUBMUCOSAL TATTOO INJECTION  11/30/2019   Procedure: SUBMUCOSAL TATTOO INJECTION;  Surgeon: Ronnette Juniper, MD;  Location: [place];  Service: Gastroenterology;;   TEE WITHOUT CARDIOVERSION N/A 01/19/2020   Procedure: TRANSESOPHAGEAL ECHOCARDIOGRAM (TEE);  Surgeon: Burnell Blanks, MD;  Location: [place] [facility];  Service: Open Heart Surgery;  Laterality: N/A;   TRANSCATHETER AORTIC VALVE REPLACEMENT, TRANSFEMORAL Left 01/19/2020   Procedure: TRANSCATHETER AORTIC VALVE REPLACEMENT, LEFT TRANSFEMORAL;  Surgeon: Burnell Blanks, MD;  Location: [place] [facility];  Service: Open Heart Surgery;  Laterality: Left;    Social History:  reports that he quit smoking about 2 years ago. His smoking use included cigarettes. He smoked an average of .5 packs per day. He has never used smokeless tobacco. He reports that he  does not drink alcohol and does not use drugs.  Allergies: No Known Allergies  (Not in a hospital admission)   Prior to Admission medications   Medication Sig Start Date End Date Taking? Authorizing Provider  acetaminophen (TYLENOL) 500 MG tablet Take 1,000 mg by mouth every 6 (six) hours as needed for moderate pain or headache.   Yes [provider]  aspirin EC 81 MG tablet Take 81 mg by mouth in the morning. Swallow whole.   Yes [provider]  atorvastatin (LIPITOR) 10 MG tablet Take 1 tablet (10 mg total) by mouth in the morning. 08/16/20  Yes Rhyne, Samantha J, PA-C  AURYXIA 1 GM 210 MG(Fe) tablet Take 420 mg by mouth 3 (three) times daily with meals. 05/30/20  Yes [provider]  B Complex-C-Folic Acid (DIALYVITE 031) 0.8 MG TABS Take 1 tablet by mouth in the morning. 05/08/19  Yes [provider]  brimonidine (ALPHAGAN) 0.15 % ophthalmic solution Place 1 drop into the right eye 3 (three) times daily.   Yes [provider]  carbidopa-levodopa (SINEMET) 25-100 MG tablet Take 1 tablet by mouth 3 (three) times daily. Patient taking differently: Take 1 tablet by mouth 3 (three) times daily. 1 TABLET @  8am/ 12pm/ 4pm 10/19/20 03/15/21 Yes Curatolo, Adam, DO  clopidogrel (PLAVIX) 75 MG tablet Take 1 tablet (75 mg total) by mouth daily with breakfast. 07/25/20  Yes Marty Heck, MD  docusate sodium (COLACE) 100 MG capsule Take 100 mg by mouth 2 (two) times daily.   Yes [provider]  dorzolamide-timolol (COSOPT) 22.3-6.8 MG/ML ophthalmic solution Place 1 drop into the right eye 2 (two) times daily. 06/25/17  Yes [provider]  gabapentin (NEURONTIN) 100 MG capsule Take 1 capsule (100 mg total) by mouth daily. 10/19/20 03/15/21 Yes Curatolo, Adam, DO  isosorbide mononitrate (IMDUR) 30 MG 24 hr tablet Take 1 tablet (30 mg total) by mouth in the morning. 08/16/20  Yes Rhyne, Hulen Shouts, PA-C  Melatonin 5 MG CAPS Take 5 mg by mouth at  bedtime.   Yes [provider]  pantoprazole (PROTONIX) 40 MG tablet Take 1 tablet (40 mg total) by mouth daily at 12 noon. 08/16/20  Yes Rhyne, Hulen Shouts, PA-C  pilocarpine (PILOCAR) 4 % ophthalmic solution Place 1 drop into the right eye 4 (four) times daily.    Yes [provider]  polyethylene glycol (MIRALAX / GLYCOLAX) 17 g packet Take 17 g by mouth at bedtime.   Yes [provider]  ROCKLATAN 0.02-0.005 % SOLN Place 1 drop into the right eye at bedtime. 06/20/20  Yes [provider]  blood glucose meter kit and supplies KIT Dispense based on patient and insurance preference. Use up to four times daily as directed. (FOR ICD-9 250.00, 250.01). For QAC - HS accuchecks. 03/30/18   Thurnell Lose, MD  carvedilol (COREG) 3.125 MG tablet Take 1 tablet (3.125 mg total) by mouth 2 (two) times daily with a meal. Patient not taking: Reported on 03/15/2021 08/16/20   Gabriel Earing, PA-C  Doxercalciferol (HECTOROL IV) Doxercalciferol (Hectorol) 08/09/20 08/28/21  [provider]  Lancets (ONETOUCH DELICA PLUS [id number]) [place]  03/31/18   [provider]  Methoxy PEG-Epoetin Beta (MIRCERA IJ) Mir[place]a 09/05/20 09/04/21  [provider]  ONETOUCH VERIO test strip USE AS DIRECTED TO TEST FOUR TIMES A DAY 07/31/18   [name], MD  oxyCODONE-acetaminophen (PERCOCET) 5-325 MG tablet Take 1 tablet by mouth every 6 (six) hours as needed (pain). Patient not taking: Reported on 03/15/2021 08/19/20   Baglia, Corrina, PA-C    Blood pressure (!) 146/71, pulse 71, temperature (!) 97.4 F (36.3 C), temperature source Oral, resp. rate (!) 22, height $RemoveBe'5\' 5"'YAkCMbAJa$  (1.651 m), weight 64.9 kg, SpO2 96 %. Physical Exam: General: pleasant, elderly male who is laying in bed in NAD HEENT: head is normocephalic, atraumatic.  Sclera are noninjected.  Ears and nose without any masses or lesions.  Mouth is pink and moist. Dentition poor Heart: regular, rate, and rhythm.  Normal  s1,s2. No obvious murmurs, gallops, or rubs noted.  Palpable right pedal pulse Lungs: CTAB, no wheezes, rhonchi, or rales noted.  Respiratory effort nonlabored on room air Abd: soft, ND, +BS, no masses or organomegaly. Umbilical hernia soft without overlying skin changes. Patient with mild diffuse tenderness but more moderate TTP in the RUQ with voluntary guarding, positive Murphy Sign MS: L BKA. Right calf soft/nontender Skin: warm and dry with no masses, lesions, or rashes Psych: A&Ox3 with an appropriate affect Neuro: MAEs  Results for orders placed or performed during the hospital encounter of 03/15/21 (from the past 48 hour(s))  Comprehensive metabolic panel     Status: Abnormal   Collection Time: 03/15/21  8:55  AM  Result Value Ref Range   Sodium 134 (L) 135 - 145 mmol/L   Potassium 4.8 3.5 - 5.1 mmol/L   Chloride 91 (L) 98 - 111 mmol/L   CO2 23 22 - 32 mmol/L   Glucose, Bld 179 (H) 70 - 99 mg/dL    Comment: Glucose reference range applies only to samples taken after fasting for at least 8 hours.   BUN 28 (H) 8 - 23 mg/dL   Creatinine, Ser 4.83 (H) 0.61 - 1.24 mg/dL   Calcium 9.1 8.9 - 07.3 mg/dL   Total Protein 7.4 6.5 - 8.1 g/dL   Albumin 3.2 (L) 3.5 - 5.0 g/dL   AST 26 15 - 41 U/L   ALT 10 0 - 44 U/L   Alkaline Phosphatase 92 38 - 126 U/L   Total Bilirubin 1.0 0.3 - 1.2 mg/dL   GFR, Estimated 7 (L) >60 mL/min    Comment: (NOTE) Calculated using the CKD-EPI Creatinine Equation (2021)    Anion gap 20 (H) 5 - 15    Comment: Performed at [facility], [address] [place], [place] [address]  Lipase, blood     Status: None   Collection Time: 03/15/21  8:55 AM  Result Value Ref Range   Lipase 20 11 - 51 U/L    Comment: Performed at [facility], [address], [place], [place] [address]  CBC with Diff     Status: Abnormal   Collection Time: 03/15/21  8:55 AM  Result Value Ref Range   WBC 19.1 (H) 4.0 - 10.5 K/uL   RBC 3.96 (L) 4.22 - 5.81 MIL/uL    Hemoglobin 13.8 13.0 - 17.0 g/dL   HCT 97.9 53.6 - 92.2 %   MCV 106.3 (H) 80.0 - 100.0 fL   MCH 34.8 (H) 26.0 - 34.0 pg   MCHC 32.8 30.0 - 36.0 g/dL   RDW 30.0 97.9 - 49.9 %   Platelets 414 (H) 150 - 400 K/uL   nRBC 0.0 0.0 - 0.2 %   Neutrophils Relative % 89 %   Neutro Abs 16.9 (H) 1.7 - 7.7 K/uL   Lymphocytes Relative 3 %   Lymphs Abs 0.6 (L) 0.7 - 4.0 K/uL   Monocytes Relative 7 %   Monocytes Absolute 1.4 (H) 0.1 - 1.0 K/uL   Eosinophils Relative 0 %   Eosinophils Absolute 0.0 0.0 - 0.5 K/uL   Basophils Relative 0 %   Basophils Absolute 0.0 0.0 - 0.1 K/uL   Immature Granulocytes 1 %   Abs Immature Granulocytes 0.12 (H) 0.00 - 0.07 K/uL    Comment: Performed at [facility], [address], [place], [place] [address]   US Abdomen Limited  Result Date: 03/15/2021 CLINICAL DATA:  Right upper quadrant pain EXAM: ULTRASOUND ABDOMEN LIMITED RIGHT UPPER QUADRANT COMPARISON:  None. FINDINGS: Gallbladder: Distended with sludge and calculi measuring up to 4 mm. No wall thickening visualized. No pericholecystic fluid. No sonographic Murphy sign noted by sonographer. Common bile duct: Diameter: 3 mm, normal Liver: Left lobe of liver is poorly visualized due to bowel gas. No focal lesion identified. Within normal limits in parenchymal echogenicity. Portal vein is patent on color Doppler imaging with normal direction of blood flow towards the liver. Other: None. IMPRESSION: Distended gallbladder with sludge and calculi but no additional sonographic evidence of acute cholecystitis. Electronically Signed   By: Guadlupe Spanish M.D.   On: 03/15/2021 11:40   CT Renal Stone Study  Result Date: 03/15/2021 CLINICAL DATA:  Right abdominal pain. EXAM: CT ABDOMEN AND PELVIS WITHOUT CONTRAST TECHNIQUE: Multidetector CT imaging of the abdomen and pelvis was performed following the standard protocol without IV contrast. RADIATION DOSE REDUCTION: This exam was performed according to the departmental  dose-optimization program which includes automated exposure control, adjustment of the mA and/or kV according to patient size and/or use of iterative reconstruction technique. COMPARISON:  CTA abdomen and pelvis 12/01/2019 FINDINGS: Lower chest: Trace right pleural fluid is new from prior. Unchanged mild bibasilar bronchiectasis. Mild dependent right lower lobe ground-glass likely subsegmental atelectasis. A partially visualized ascending aortic endograft. Dense coronary artery calcifications are seen. No pericardial effusion. Lack of intra-articular fluid limits evaluation of the abdominal and pelvic organ parenchyma. The following findings are made within this limitation. Hepatobiliary: Smooth liver contours. No gross liver lesion is seen. The gallbladder is again moderately distended with numerous layering small gallstones, similar to prior. No definite gallbladder wall thickening. Pancreas: Unremarkable. No pancreatic ductal dilatation or surrounding inflammatory changes. Spleen: Normal in size without focal abnormality. Adrenals/Urinary Tract: Left adrenal 2.6 cm nodule is unchanged from 11/26/2019 with internal density of 15 Hounsfield units. Right adrenal 15 mm nodule is unchanged in size with internal density of 5 Hounsfield units suggesting a benign lipid rich adenoma. These both again are likely benign. Mild left-greater-than-right renal pelvis vascular calcifications. Mild bilateral renal cortical thinning is again seen. Bilateral moderate extrarenal pelves are again noted. There is again mild-to-moderate dilatation of the bilateral ureters diffusely down to the level of the ureteropelvic junction likely chronic. No obstructing renal stone is seen. No hydronephrosis. Within the limitations of lack of IV contrast, no contour deforming renal mass. The urinary bladder is grossly unremarkable. Stomach/Bowel: No bowel wall thickening. The terminal ileum is unremarkable. The appendix is within normal limits. No  dilated loops of bowel to indicate bowel obstruction. Vascular/Lymphatic: No abdominal aortic aneurysm. Dense vascular calcifications. No enlarged abdominal or pelvic lymph nodes. 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 Other: Small fat containing umbilical hernia, unchanged. Unchanged small fat containing left inguinal hernia. No free air or free fluid. Musculoskeletal: Mild multilevel degenerative disc changes. Old healed lateral right eleventh rib fracture unchanged. IMPRESSION: 1. No nephroureterolithiasis or hydronephrosis. 2. Cholelithiasis. 3. 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 4. Small fat containing umbilical and left inguinal hernias. 5. Trace right pleural effusion with mild subsegmental atelectasis. 6.  Aortic Atherosclerosis (ICD10-I70.0). Electronically Signed   By: Yvonne Kendall   On: 03/15/2021 11:14    Anti-infectives (From admission, onward)    None        Assessment/Plan Acute calculous cholecystitis -Patient with cholelithiasis on imaging but no radiographic evidence of cholecystitis.  However, he does have an elevated WBC at 19.1 and is very tender in the RUQ on exam concerning for cholecystitis. LFT's and Lipase wnl. Given his multiple medical issues recommend admission to [facility] for IV antibiotics and medical/cardiac clearance.  If he is felt to be a candidate for laparoscopic cholecystectomy, we will have to wait for Plavix to wear off before this is performed - will discuss timing with MD.  If he was to acutely worsen would recommend percutaneous cholecystostomy tube in the interim. We will follow with you.   ID - Zosyn VTE - SCDs, okay for chemical prophylaxis from general surgery standpoint.  Hold Plavix FEN - Currently NPO. Okay for CLD. Foley - None  Follow up - TBD  HTN HLD ESRD on HD (  M/W/F) DM2 Aortic Stenosis s/p TAVR on Plavix (last dose 1/10 in AM) L BKA CHF (EF 65-70% on  ECHO 01/05/21) Parkinson's Remote hx of prostate CA who presented for abdominal pain.    High Medical Decision Making  Margie Billet, [place] [facility] 03/15/2021, 12:40 PM Please see Amion for pager number during day hours 7:00am-4:30pm

## 2021-03-15 NOTE — ED Notes (Signed)
Pt vomited a little again.

## 2021-03-15 NOTE — Progress Notes (Signed)
Nurse reported patient vomited x1 and O2 saturation dropped 88%. Xray showed RLL infiltrate, likely aspiration pneumonitis.   HOB 30 degree and aspiration precautions, patient is on Zosyn.

## 2021-03-15 NOTE — ED Triage Notes (Signed)
Pt arrived via [facility] from [facility] for c/o 10/10 sharp intermittent RLQ abdominal pain that started yesterday. Per EMS, pt stated he has had small Bms and a small episode of diarrhea. Pt is dialysis pt M, W, Fri. Pt has had 2 episodes of vomiting per EMS. Facility gave pt a suppository at 0400 today. Pt is A&Ox4. VSS

## 2021-03-16 ENCOUNTER — Inpatient Hospital Stay (HOSPITAL_COMMUNITY): Payer: Medicare Other

## 2021-03-16 ENCOUNTER — Encounter (HOSPITAL_COMMUNITY): Payer: Self-pay | Admitting: Internal Medicine

## 2021-03-16 HISTORY — PX: IR PERC CHOLECYSTOSTOMY: IMG2326

## 2021-03-16 LAB — RENAL FUNCTION PANEL
Albumin: 2.6 g/dL — ABNORMAL LOW (ref 3.5–5.0)
Anion gap: 23 — ABNORMAL HIGH (ref 5–15)
BUN: 59 mg/dL — ABNORMAL HIGH (ref 8–23)
CO2: 28 mmol/L (ref 22–32)
Calcium: 9.2 mg/dL (ref 8.9–10.3)
Chloride: 88 mmol/L — ABNORMAL LOW (ref 98–111)
Creatinine, Ser: 9.69 mg/dL — ABNORMAL HIGH (ref 0.61–1.24)
GFR, Estimated: 5 mL/min — ABNORMAL LOW (ref >60)
Glucose, Bld: 170 mg/dL — ABNORMAL HIGH (ref 70–99)
Phosphorus: 6.6 mg/dL — ABNORMAL HIGH (ref 2.5–4.6)
Potassium: 6.1 mmol/L — ABNORMAL HIGH (ref 3.5–5.1)
Sodium: 139 mmol/L (ref 135–145)

## 2021-03-16 LAB — CBC
HCT: 45.1 % (ref 39.0–52.0)
Hemoglobin: 15 g/dL (ref 13.0–17.0)
MCH: 34.8 pg — ABNORMAL HIGH (ref 26.0–34.0)
MCHC: 33.3 g/dL (ref 30.0–36.0)
MCV: 104.6 fL — ABNORMAL HIGH (ref 80.0–100.0)
Platelets: 343 10*3/uL (ref 150–400)
RBC: 4.31 MIL/uL (ref 4.22–5.81)
RDW: 14.4 % (ref 11.5–15.5)
WBC: 40.7 10*3/uL — ABNORMAL HIGH (ref 4.0–10.5)
nRBC: 0 % (ref 0.0–0.2)

## 2021-03-16 LAB — GLUCOSE, CAPILLARY
Glucose-Capillary: 141 mg/dL — ABNORMAL HIGH (ref 70–99)
Glucose-Capillary: 142 mg/dL — ABNORMAL HIGH (ref 70–99)
Glucose-Capillary: 147 mg/dL — ABNORMAL HIGH (ref 70–99)
Glucose-Capillary: 168 mg/dL — ABNORMAL HIGH (ref 70–99)
Glucose-Capillary: 172 mg/dL — ABNORMAL HIGH (ref 70–99)
Glucose-Capillary: 232 mg/dL — ABNORMAL HIGH (ref 70–99)

## 2021-03-16 LAB — COMPREHENSIVE METABOLIC PANEL
ALT: 14 U/L (ref 0–44)
AST: 48 U/L — ABNORMAL HIGH (ref 15–41)
Albumin: 2.6 g/dL — ABNORMAL LOW (ref 3.5–5.0)
Alkaline Phosphatase: 98 U/L (ref 38–126)
Anion gap: 25 — ABNORMAL HIGH (ref 5–15)
BUN: 41 mg/dL — ABNORMAL HIGH (ref 8–23)
CO2: 29 mmol/L (ref 22–32)
Calcium: 9.2 mg/dL (ref 8.9–10.3)
Chloride: 85 mmol/L — ABNORMAL LOW (ref 98–111)
Creatinine, Ser: 8.56 mg/dL — ABNORMAL HIGH (ref 0.61–1.24)
GFR, Estimated: 6 mL/min — ABNORMAL LOW (ref >60)
Glucose, Bld: 249 mg/dL — ABNORMAL HIGH (ref 70–99)
Potassium: 5.4 mmol/L — ABNORMAL HIGH (ref 3.5–5.1)
Sodium: 139 mmol/L (ref 135–145)
Total Bilirubin: 1 mg/dL (ref 0.3–1.2)
Total Protein: 8.5 g/dL — ABNORMAL HIGH (ref 6.5–8.1)

## 2021-03-16 MED ORDER — SODIUM CHLORIDE 0.9% FLUSH
5.0000 mL | Freq: Three times a day (TID) | INTRAVENOUS | Status: DC
  Administered 2021-03-17: 5 mL

## 2021-03-16 MED ORDER — CINACALCET HCL 30 MG PO TABS
60.0000 mg | ORAL_TABLET | ORAL | Status: DC
  Administered 2021-03-22: 60 mg via ORAL
  Filled 2021-03-16 (×4): qty 2

## 2021-03-16 MED ORDER — HEPARIN SODIUM (PORCINE) 5000 UNIT/ML IJ SOLN
5000.0000 [IU] | Freq: Three times a day (TID) | INTRAMUSCULAR | Status: DC
  Administered 2021-03-17 – 2021-03-18 (×4): 5000 [IU] via SUBCUTANEOUS
  Filled 2021-03-16 (×4): qty 1

## 2021-03-16 MED ORDER — SODIUM ZIRCONIUM CYCLOSILICATE 10 G PO PACK
10.0000 g | PACK | Freq: Once | ORAL | Status: AC
  Administered 2021-03-16: 10 g via ORAL
  Filled 2021-03-16: qty 1

## 2021-03-16 MED ORDER — CHLORHEXIDINE GLUCONATE CLOTH 2 % EX PADS
6.0000 | MEDICATED_PAD | Freq: Every day | CUTANEOUS | Status: DC
  Administered 2021-03-17 – 2021-03-23 (×7): 6 via TOPICAL

## 2021-03-16 MED ORDER — HYDROMORPHONE HCL 1 MG/ML IJ SOLN
INTRAMUSCULAR | Status: AC | PRN
  Administered 2021-03-16: .5 mg via INTRAVENOUS

## 2021-03-16 MED ORDER — INSULIN ASPART 100 UNIT/ML IJ SOLN
0.0000 [IU] | INTRAMUSCULAR | Status: DC
  Administered 2021-03-16: 2 [IU] via SUBCUTANEOUS
  Administered 2021-03-17 – 2021-03-22 (×7): 1 [IU] via SUBCUTANEOUS

## 2021-03-16 MED ORDER — PROSOURCE PLUS PO LIQD
30.0000 mL | Freq: Two times a day (BID) | ORAL | Status: DC
  Administered 2021-03-19 – 2021-03-22 (×4): 30 mL via ORAL
  Filled 2021-03-16 (×8): qty 30

## 2021-03-16 MED ORDER — LIDOCAINE HCL (PF) 1 % IJ SOLN
INTRAMUSCULAR | Status: AC | PRN
  Administered 2021-03-16: 10 mL

## 2021-03-16 MED ORDER — SODIUM ZIRCONIUM CYCLOSILICATE 10 G PO PACK
10.0000 g | PACK | Freq: Once | ORAL | Status: AC
  Administered 2021-03-17: 10 g via ORAL
  Filled 2021-03-16: qty 1

## 2021-03-16 MED ORDER — MIDAZOLAM HCL 2 MG/2ML IJ SOLN
INTRAMUSCULAR | Status: AC
  Filled 2021-03-16: qty 2

## 2021-03-16 MED ORDER — MIDAZOLAM HCL 2 MG/2ML IJ SOLN
INTRAMUSCULAR | Status: AC | PRN
  Administered 2021-03-16: .5 mg via INTRAVENOUS

## 2021-03-16 MED ORDER — HYDROMORPHONE HCL 1 MG/ML IJ SOLN
INTRAMUSCULAR | Status: AC
  Filled 2021-03-16: qty 1

## 2021-03-16 MED ORDER — IOHEXOL 300 MG/ML  SOLN
100.0000 mL | Freq: Once | INTRAMUSCULAR | Status: AC | PRN
  Administered 2021-03-16: 10 mL

## 2021-03-16 MED ORDER — FENTANYL CITRATE (PF) 100 MCG/2ML IJ SOLN
INTRAMUSCULAR | Status: AC | PRN
Start: 2021-03-16 — End: 2021-03-16
  Administered 2021-03-16: 25 ug via INTRAVENOUS

## 2021-03-16 MED ORDER — LIDOCAINE HCL 1 % IJ SOLN
INTRAMUSCULAR | Status: AC
  Filled 2021-03-16: qty 20

## 2021-03-16 MED ORDER — DOXERCALCIFEROL 4 MCG/2ML IV SOLN
2.0000 ug | INTRAVENOUS | Status: DC
  Administered 2021-03-18: 2 ug via INTRAVENOUS
  Filled 2021-03-16: qty 2

## 2021-03-16 MED ORDER — SODIUM CHLORIDE 0.9 % IV BOLUS
250.0000 mL | Freq: Once | INTRAVENOUS | Status: AC
  Administered 2021-03-17: 250 mL via INTRAVENOUS

## 2021-03-16 MED ORDER — FENTANYL CITRATE (PF) 100 MCG/2ML IJ SOLN
INTRAMUSCULAR | Status: AC
  Filled 2021-03-16: qty 2

## 2021-03-16 MED ORDER — SEVELAMER CARBONATE 800 MG PO TABS
1600.0000 mg | ORAL_TABLET | Freq: Three times a day (TID) | ORAL | Status: DC
  Administered 2021-03-18 – 2021-03-24 (×16): 1600 mg via ORAL
  Filled 2021-03-16 (×17): qty 2

## 2021-03-16 NOTE — Progress Notes (Signed)
Pt to transfer to progressive care unit due to in and out of second degree heart block this evening.   Pt will go to 6E29, attempted to give report at 1943 and was able to give at 2000 with unit charge nurse Hinton Dyer, reported completed at 2008.  Current RN Jewel on 6N will assist in transporting pt over to 6E29.

## 2021-03-16 NOTE — Progress Notes (Addendum)
HOSPITAL MEDICINE OVERNIGHT EVENT NOTE    Contacted by rapid response due to patient exhibiting ongoing tachycardia and hypotension.  Chart reviewed, patient currently being managed for acute cholecystitis and is currently on intravenous Zosyn.  Patient is status post percutaneous drain placement.  Patient has recently been transferred this evening from the medical telemetry unit to the cardiac progressive unit due to concerns for second-degree heart block.  Rapid response has already obtained a repeat chemistry this evening revealing persisting hyperkalemia, now 6.1.  Due to worsening hypotension I have advised administering 250 cc bolus of isotonic fluids as well as obtaining blood cultures and lactic acid level.  We will additionally administer 10 g of Lokelma for patient's hyperkalemia and obtain an EKG.  Patient is to undergo dialysis in the morning.  If patient fails to experience resolution of hypotension after the bolus will consider broadening antibiotic coverage.  Continue to monitor patient closely.  Vernelle Emerald  MD [facility]   ADDENDUM (1/13 1:40am)  Blood pressure after boluses 137/85.  No evidence of shortness of breath or increasing oxygen requirement after bolus.  Heart rate has also come down to the 90s.  Lokelma has been administered.  Continuing intravenous Zosyn.  Nursing reports patient had some observed difficulty with swallowing the Lokelma.  We will also place order for SLP evaluation.  Sherryll Burger [place] Hudman

## 2021-03-16 NOTE — Progress Notes (Signed)
Transferred to [phone]

## 2021-03-16 NOTE — Progress Notes (Signed)
Daughter [name] was notified of pt's new room number [id number] as requested.

## 2021-03-16 NOTE — Progress Notes (Addendum)
Patient ID: Elijah White, male   DOB: [birth year], 81 y.o.   MRN: [id number] [facility] Surgery Progress Note     Subjective: CC-  Continues to complain of severe abdominal pain. Points to RUQ as to where the majority of his pain is located. Some nausea, vomited yesterday and desat. On 3L [place].  WBC up to 40  Objective: Vital signs in last 24 hours: Temp:  [97 F (36.1 C)-97.4 F (36.3 C)] 97.4 F (36.3 C) (01/12 0500) Pulse Rate:  [38-96] 92 (01/12 0500) Resp:  [12-27] 18 (01/12 0500) BP: (112-157)/(59-84) 112/66 (01/12 0500) SpO2:  [92 %-100 %] 100 % (01/12 0500) Weight:  [64.9 kg] 64.9 kg (01/11 0824)    Intake/Output from previous day: 01/11 0701 - 01/12 0700 In: 89.9 [IV Piggyback:89.9] Out: -  Intake/Output this shift: No intake/output data recorded.  PE: Gen:  Alert, NAD Card:  RRR Pulm:  decreased breath sounds bilateral bases. no W/R/R, rate and effort normal on 3L [place]: soft, ND, +BS, no masses or organomegaly. Umbilical hernia soft without overlying skin changes. Moderate TTP in the RUQ with voluntary guarding and mildly tender in the RLQ Ext:  s/p left BKA Psych: Alert and oriented to self and year, somewhat confused about where he is, repetitive  Skin: no rashes noted, warm and dry  Lab Results:  Recent Labs    03/15/21 0855 03/16/21 0251  WBC 19.1* 40.7*  HGB 13.8 15.0  HCT 42.1 45.1  PLT 414* 343   BMET Recent Labs    03/15/21 0855 03/16/21 0545  NA 134* 139  K 4.8 5.4*  CL 91* 85*  CO2 23 29  GLUCOSE 179* 249*  BUN 28* 41*  CREATININE 7.25* 8.56*  CALCIUM 9.1 9.2   PT/INR No results for input(s): LABPROT, INR in the last 72 hours. CMP     Component Value Date/Time   NA 139 03/16/2021 0545   NA 134 (A) 09/11/2019 0000   K 5.4 (H) 03/16/2021 0545   CL 85 (L) 03/16/2021 0545   CO2 29 03/16/2021 0545   GLUCOSE 249 (H) 03/16/2021 0545   BUN 41 (H) 03/16/2021 0545   BUN 34 (A) 09/11/2019 0000   CREATININE 8.56 (H) 03/16/2021  0545   CALCIUM 9.2 03/16/2021 0545   PROT 8.5 (H) 03/16/2021 0545   PROT 6.9 02/19/2018 1014   ALBUMIN 2.6 (L) 03/16/2021 0545   ALBUMIN 4.2 02/19/2018 1014   AST 48 (H) 03/16/2021 0545   ALT 14 03/16/2021 0545   ALKPHOS 98 03/16/2021 0545   BILITOT 1.0 03/16/2021 0545   BILITOT 0.5 02/19/2018 1014   GFRNONAA 6 (L) 03/16/2021 0545   GFRAA 7 (L) 12/03/2019 0710   Lipase     Component Value Date/Time   LIPASE 20 03/15/2021 0855       Studies/Results: DG Chest 1 View  Result Date: 03/15/2021 CLINICAL DATA:  Abdominal pain and vomiting. Clinical concern for aspiration. EXAM: CHEST  1 VIEW COMPARISON:  06/24/2020 FINDINGS: Normal sized heart. Interval minimal patchy density at the right lung base. The left lung remains clear. Aortic valve stent. Thoracic spine degenerative changes. Left axillary surgical clip. IMPRESSION: Minimal right basilar atelectasis, pneumonia or aspiration pneumonitis. Electronically Signed   By: Claudie Revering M.D.   On: 03/15/2021 14:07   US Abdomen Limited  Result Date: 03/15/2021 CLINICAL DATA:  Right upper quadrant pain EXAM: ULTRASOUND ABDOMEN LIMITED RIGHT UPPER QUADRANT COMPARISON:  None. FINDINGS: Gallbladder: Distended with sludge and calculi measuring up to  4 mm. No wall thickening visualized. No pericholecystic fluid. No sonographic Murphy sign noted by sonographer. Common bile duct: Diameter: 3 mm, normal Liver: Left lobe of liver is poorly visualized due to bowel gas. No focal lesion identified. Within normal limits in parenchymal echogenicity. Portal vein is patent on color Doppler imaging with normal direction of blood flow towards the liver. Other: None. IMPRESSION: Distended gallbladder with sludge and calculi but no additional sonographic evidence of acute cholecystitis. Electronically Signed   By: Macy Mis M.D.   On: 03/15/2021 11:40   DG CHEST PORT 1 VIEW  Result Date: 03/16/2021 CLINICAL DATA:  Hypoxia EXAM: PORTABLE CHEST 1 VIEW  COMPARISON:  Chest x-ray dated March 15, 2021 FINDINGS: Cardiac and mediastinal contours are unchanged within normal limits. Prior transcatheter aortic valve replacement. Interval resolution of right basilar lung opacity, favor resolved atelectasis. No focal consolidation. No large pleural effusion pneumothorax. IMPRESSION: Interval resolution of right basilar lung opacity, favor resolved atelectasis. Electronically Signed   By: Yetta Glassman M.D.   On: 03/16/2021 07:46   CT Renal Stone Study  Result Date: 03/15/2021 CLINICAL DATA:  Right abdominal pain. EXAM: CT ABDOMEN AND PELVIS WITHOUT CONTRAST TECHNIQUE: Multidetector CT imaging of the abdomen and pelvis was performed following the standard protocol without IV contrast. RADIATION DOSE REDUCTION: This exam was performed according to the departmental dose-optimization program which includes automated exposure control, adjustment of the mA and/or kV according to patient size and/or use of iterative reconstruction technique. COMPARISON:  CTA abdomen and pelvis 12/01/2019 FINDINGS: Lower chest: Trace right pleural fluid is new from prior. Unchanged mild bibasilar bronchiectasis. Mild dependent right lower lobe ground-glass likely subsegmental atelectasis. A partially visualized ascending aortic endograft. Dense coronary artery calcifications are seen. No pericardial effusion. Lack of intra-articular fluid limits evaluation of the abdominal and pelvic organ parenchyma. The following findings are made within this limitation. Hepatobiliary: Smooth liver contours. No gross liver lesion is seen. The gallbladder is again moderately distended with numerous layering small gallstones, similar to prior. No definite gallbladder wall thickening. Pancreas: Unremarkable. No pancreatic ductal dilatation or surrounding inflammatory changes. Spleen: Normal in size without focal abnormality. Adrenals/Urinary Tract: Left adrenal 2.6 cm nodule is unchanged from 11/26/2019 with  internal density of 15 Hounsfield units. Right adrenal 15 mm nodule is unchanged in size with internal density of 5 Hounsfield units suggesting a benign lipid rich adenoma. These both again are likely benign. Mild left-greater-than-right renal pelvis vascular calcifications. Mild bilateral renal cortical thinning is again seen. Bilateral moderate extrarenal pelves are again noted. There is again mild-to-moderate dilatation of the bilateral ureters diffusely down to the level of the ureteropelvic junction likely chronic. No obstructing renal stone is seen. No hydronephrosis. Within the limitations of lack of IV contrast, no contour deforming renal mass. The urinary bladder is grossly unremarkable. Stomach/Bowel: No bowel wall thickening. The terminal ileum is unremarkable. The appendix is within normal limits. No dilated loops of bowel to indicate bowel obstruction. Vascular/Lymphatic: No abdominal aortic aneurysm. Dense vascular calcifications. No enlarged abdominal or pelvic lymph nodes. 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 Other: Small fat containing umbilical hernia, unchanged. Unchanged small fat containing left inguinal hernia. No free air or free fluid. Musculoskeletal: Mild multilevel degenerative disc changes. Old healed lateral right eleventh rib fracture unchanged. IMPRESSION: 1. No nephroureterolithiasis or hydronephrosis. 2. Cholelithiasis. 3. 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 4. Small fat containing umbilical and left inguinal hernias. 5. Trace right pleural effusion with mild  subsegmental atelectasis. 6.  Aortic Atherosclerosis (ICD10-I70.0). Electronically Signed   By: Yvonne Kendall   On: 03/15/2021 11:14    Anti-infectives: Anti-infectives (From admission, onward)    Start     Dose/Rate Route Frequency Ordered Stop   03/15/21 1400  piperacillin-tazobactam (ZOSYN) IVPB 2.25 g        2.25 g 100 mL/hr over  30 Minutes Intravenous Every 8 hours 03/15/21 1326     03/15/21 1330  Ampicillin-Sulbactam (UNASYN) 3 g in sodium chloride 0.9 % 100 mL IVPB  Status:  Discontinued        3 g 200 mL/hr over 30 Minutes Intravenous Every evening 03/15/21 1324 03/15/21 1326        Assessment/Plan RUQ abdominal pain, n/v Cholelithiasis Acute cholecystitis - Given the patient's multiple medical issues he would be high risk with surgery. He is now on supplemental oxygen and somewhat more confused this morning. Plan discussed with primary team and we are going to ask IR for percutaneous cholecystostomy tube placement. If needed prior to perc chole he is scheduled for a HIDA scan at 1030 this morning. Keep NPO and continue holding plavix. Continue antibiotics. I called and updated the patient's daughter.  ID - Zosyn VTE - SCDs, hold chemical prophylaxis for possible procedure.  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    High Medical Decision Making  Moderate Medical Decision Making   LOS: 1 day    Wellington Hampshire, [facility] Surgery 03/16/2021, 8:09 AM Please see Amion for pager number during day hours 7:00am-4:30pm

## 2021-03-16 NOTE — Sedation Documentation (Signed)
Spoke with Patients niece, Alta Corning as well as patient briefly with status update on patient.  Patient was not complaining of pain in the abdomen but did develop pain in the right foot.  Pain was communicated to RN Tammy on 6N.

## 2021-03-16 NOTE — Consult Note (Addendum)
[facility] Renal Consultation Note    Indication for Consultation:  Management of ESRD/hemodialysis; anemia, hypertension/volume and secondary hyperparathyroidism PCP: Dr. Maryella Shivers  HPI: Elijah White is a 81 y.o. male with ESRD on hemodialysis MWF at [facility]. PMH: DMT2, AS S/P TAVR, HTN, Parkinson's disease, H/O prostate cancer, glaucoma, PAD S/P L BKA, Blind in L eye, Anemia of ESRD, SHPT. Last HD 03/13/2021. He left 0.8 kg under OP EDW. Does not miss HD but does truncate treatments.   Patient present to ED 03/15/2021 with C/O episodic nausea, vomiting, RUQ abdominal pain. Initial chemistry unremarkable to ESRD patient. WBC 19.1 HGB 13.8. CT of abdomen showed cholelithiasis. He has been admitted for RUQ abdominal pain 2/2 to cholelithiasis. General surgery has been consulted. IR has been consulted for percutaneous cholecystostomy tube placement as he is high risk for surgery. We have been asked to manage hemodialysis.   Seen in room. Alert, oriented to person and place. Irregular heart rhythm with what looks to be 2nd degree type 2 on monitor. Have ordered 12 lead EKG. Abdomen exquisitely tender to touch RUQ with perc choley drain place. Denies SOB.   Past Medical History:  Diagnosis Date   Anemia    low iron   Aortic stenosis    s/p TAVR 01/19/20   Arthritis    Cancer [facility])    prostate, s/p I-125 seed implant 05/18/05   Colon polyps ~ 1993 and 2003   Dr [name], [facility].  08/2001 colonoscopy: tubular adenoma at cecum.     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   Elevated cholesterol with high triglycerides    ESRD (end stage renal disease) (HCC)    [facility]   GERD (gastroesophageal reflux disease)    Glaucoma    Hypertension    Legally blind in left eye, as defined in [place]    has pinpoint vision in right eye   PAD (peripheral artery disease) ([place])    RBBB    Past Surgical History:   Procedure Laterality Date   ABDOMINAL AORTOGRAM W/LOWER EXTREMITY N/A 07/25/2020   Procedure: ABDOMINAL AORTOGRAM W/LOWER EXTREMITY;  Surgeon: Marty Heck, MD;  Location: [place] [facility];  Service: Cardiovascular;  Laterality: N/A;   AMPUTATION Left 08/12/2020   Procedure: LEFT BELOW KNEE AMPUTATION;  Surgeon: Marty Heck, MD;  Location: [place];  Service: Vascular;  Laterality: Left;   AV FISTULA PLACEMENT Left 03/20/2018   Procedure: ARTERIOVENOUS (AV) FISTULA CREATION ARM;  Surgeon: [name], MD;  Location: [place];  Service: Vascular;  Laterality: Left;   [place] Left 05/21/2018   Procedure: LEFT BASILIC VEIN FISTULA SECOND STAGE;  Surgeon: [name], MD;  Location: [place];  Service: Vascular;  Laterality: Left;   COLONOSCOPY     COLONOSCOPY WITH PROPOFOL N/A 11/30/2019   Procedure: COLONOSCOPY WITH PROPOFOL;  Surgeon: Ronnette Juniper, MD;  Location: [place];  Service: Gastroenterology;  Laterality: N/A;   GLAUCOMA SURGERY  2019   multiple surgeries   HEMOSTASIS CLIP PLACEMENT  11/30/2019   Procedure: HEMOSTASIS CLIP PLACEMENT;  Surgeon: Ronnette Juniper, MD;  Location: [facility] ENDOSCOPY;  Service: Gastroenterology;;   IR PERC CHOLECYSTOSTOMY  03/16/2021   KNEE ARTHROSCOPY     MULTIPLE EXTRACTIONS WITH ALVEOLOPLASTY N/A 12/30/2019   Procedure: MULTIPLE EXTRACTION WITH ALVEOLOPLASTY;  Surgeon: Charlaine Dalton, DMD;  Location: [place];  Service: Dentistry;  Laterality: N/A;   PERIPHERAL VASCULAR  INTERVENTION Left 07/25/2020   Procedure: PERIPHERAL VASCULAR INTERVENTION;  Surgeon: Marty Heck, MD;  Location: [place] [facility];  Service: Cardiovascular;  Laterality: Left;  superficial femoral   POLYPECTOMY  11/30/2019   Procedure: POLYPECTOMY;  Surgeon: Ronnette Juniper, MD;  Location: [place];  Service: Gastroenterology;;   PROSTATE SURGERY     RIGHT/LEFT HEART CATH AND CORONARY ANGIOGRAPHY N/A 12/03/2019   Procedure: RIGHT/LEFT  HEART CATH AND CORONARY ANGIOGRAPHY;  Surgeon: Burnell Blanks, MD;  Location: [place] [facility];  Service: Cardiovascular;  Laterality: N/A;   SUBMUCOSAL TATTOO INJECTION  11/30/2019   Procedure: SUBMUCOSAL TATTOO INJECTION;  Surgeon: Ronnette Juniper, MD;  Location: [place];  Service: Gastroenterology;;   TEE WITHOUT CARDIOVERSION N/A 01/19/2020   Procedure: TRANSESOPHAGEAL ECHOCARDIOGRAM (TEE);  Surgeon: Burnell Blanks, MD;  Location: [place] [facility];  Service: Open Heart Surgery;  Laterality: N/A;   TRANSCATHETER AORTIC VALVE REPLACEMENT, TRANSFEMORAL Left 01/19/2020   Procedure: TRANSCATHETER AORTIC VALVE REPLACEMENT, LEFT TRANSFEMORAL;  Surgeon: Burnell Blanks, MD;  Location: [place] [facility];  Service: Open Heart Surgery;  Laterality: Left;   Family History  Problem Relation Age of Onset   CAD Mother    Social History:  reports that he quit smoking about 2 years ago. His smoking use included cigarettes. He smoked an average of .5 packs per day. He has never used smokeless tobacco. He reports that he does not drink alcohol and does not use drugs. No Known Allergies Prior to Admission medications   Medication Sig Start Date End Date Taking? Authorizing Provider  acetaminophen (TYLENOL) 500 MG tablet Take 1,000 mg by mouth every 6 (six) hours as needed for moderate pain or headache.   Yes [provider]  aspirin EC 81 MG tablet Take 81 mg by mouth in the morning. Swallow whole.   Yes [provider]  atorvastatin (LIPITOR) 10 MG tablet Take 1 tablet (10 mg total) by mouth in the morning. 08/16/20  Yes Rhyne, Samantha J, PA-C  AURYXIA 1 GM 210 MG(Fe) tablet Take 420 mg by mouth 3 (three) times daily with meals. 05/30/20  Yes [provider]  B Complex-C-Folic Acid (DIALYVITE 237) 0.8 MG TABS Take 1 tablet by mouth in the morning. 05/08/19  Yes [provider]  brimonidine (ALPHAGAN) 0.15 % ophthalmic solution Place 1 drop into  the right eye 3 (three) times daily.   Yes [provider]  carbidopa-levodopa (SINEMET) 25-100 MG tablet Take 1 tablet by mouth 3 (three) times daily. Patient taking differently: Take 1 tablet by mouth 3 (three) times daily. 1 TABLET @ 8am/ 12pm/ 4pm 10/19/20 03/15/21 Yes Curatolo, Adam, DO  clopidogrel (PLAVIX) 75 MG tablet Take 1 tablet (75 mg total) by mouth daily with breakfast. 07/25/20  Yes Marty Heck, MD  docusate sodium (COLACE) 100 MG capsule Take 100 mg by mouth 2 (two) times daily.   Yes [provider]  dorzolamide-timolol (COSOPT) 22.3-6.8 MG/ML ophthalmic solution Place 1 drop into the right eye 2 (two) times daily. 06/25/17  Yes [provider]  gabapentin (NEURONTIN) 100 MG capsule Take 1 capsule (100 mg total) by mouth daily. 10/19/20 03/15/21 Yes Curatolo, Adam, DO  isosorbide mononitrate (IMDUR) 30 MG 24 hr tablet Take 1 tablet (30 mg total) by mouth in the morning. 08/16/20  Yes Rhyne, Hulen Shouts, PA-C  Melatonin 5 MG CAPS Take 5 mg by mouth at bedtime.   Yes [provider]  pantoprazole (PROTONIX) 40 MG tablet Take 1  tablet (40 mg total) by mouth daily at 12 noon. 08/16/20  Yes Rhyne, Hulen Shouts, PA-C  pilocarpine (PILOCAR) 4 % ophthalmic solution Place 1 drop into the right eye 4 (four) times daily.    Yes [provider]  polyethylene glycol (MIRALAX / GLYCOLAX) 17 g packet Take 17 g by mouth at bedtime.   Yes [provider]  ROCKLATAN 0.02-0.005 % SOLN Place 1 drop into the right eye at bedtime. 06/20/20  Yes [provider]  blood glucose meter kit and supplies KIT Dispense based on patient and insurance preference. Use up to four times daily as directed. (FOR ICD-9 250.00, 250.01). For QAC - HS accuchecks. 03/30/18   Thurnell Lose, MD  carvedilol (COREG) 3.125 MG tablet Take 1 tablet (3.125 mg total) by mouth 2 (two) times daily with a meal. Patient not taking: Reported on 03/15/2021 08/16/20   Gabriel Earing, PA-C  Doxercalciferol (HECTOROL IV) Doxercalciferol (Hectorol) 08/09/20 08/28/21  [provider]  Lancets (ONETOUCH DELICA PLUS [id number]) [place]  03/31/18   [provider]  Methoxy PEG-Epoetin Beta (MIRCERA IJ) Mircera 09/05/20 09/04/21  [provider]  ONETOUCH VERIO test strip USE AS DIRECTED TO TEST FOUR TIMES A DAY 07/31/18   [name], MD  oxyCODONE-acetaminophen (PERCOCET) 5-325 MG tablet Take 1 tablet by mouth every 6 (six) hours as needed (pain). Patient not taking: Reported on 03/15/2021 08/19/20   Karoline Caldwell, PA-C   Current Facility-Administered Medications  Medication Dose Route Frequency Provider Last Rate Last Admin   acetaminophen (TYLENOL) tablet 1,000 mg  1,000 mg Oral Q6H PRN Wynetta Fines T, MD   1,000 mg at 03/16/21 1006   aspirin EC tablet 81 mg  81 mg Oral q AM Lequita Halt, MD   81 mg at 03/16/21 1509   atorvastatin (LIPITOR) tablet 10 mg  10 mg Oral q AM Wynetta Fines T, MD   10 mg at 03/16/21 1509   brimonidine (ALPHAGAN) 0.2 % ophthalmic solution 1 drop  1 drop Right Eye TID Wynetta Fines T, MD   1 drop at 03/16/21 1010   carbidopa-levodopa (SINEMET IR) 25-100 MG per tablet immediate release 1 tablet  1 tablet Oral TID Wynetta Fines T, MD   1 tablet at 03/16/21 1510   docusate sodium (COLACE) capsule 100 mg  100 mg Oral BID Wynetta Fines T, MD   100 mg at 03/15/21 1827   dorzolamide-timolol (COSOPT) 22.3-6.8 MG/ML ophthalmic solution 1 drop  1 drop Right Eye BID Wynetta Fines T, MD   1 drop at 03/16/21 1013   fentaNYL (SUBLIMAZE) 100 MCG/2ML injection            ferric citrate (AURYXIA) tablet 420 mg  420 mg Oral TID WC Wynetta Fines T, MD   420 mg at 03/15/21 1649   gabapentin (NEURONTIN) capsule 100 mg  100 mg Oral Daily Wynetta Fines T, MD   100 mg at 03/16/21 1510   [START ON 03/17/2021] heparin injection 5,000 Units  5,000 Units Subcutaneous Q8H Vann, Jessica U, DO       hydrALAZINE (APRESOLINE) tablet 25 mg  25 mg Oral Q6H PRN Wynetta Fines T,  MD       HYDROmorphone (DILAUDID) 1 MG/ML injection            HYDROmorphone (DILAUDID) injection 0.5-1 mg  0.5-1 mg Intravenous Q2H PRN Wynetta Fines T, MD   1 mg at 03/16/21 0459   insulin aspart (novoLOG) injection 0-6 Units  0-6 Units  Subcutaneous Q4H Eulogio Bear U, DO   2 Units at 03/16/21 1018   ipratropium-albuterol (DUONEB) 0.5-2.5 (3) MG/3ML nebulizer solution 3 mL  3 mL Nebulization Q4H PRN Wynetta Fines T, MD       lidocaine (XYLOCAINE) 1 % (with pres) injection            midazolam (VERSED) 2 MG/2ML injection            morphine 2 MG/ML injection 2 mg  2 mg Intravenous Q3H PRN Meuth, Brooke A, PA-C       Netarsudil-Latanoprost 0.02-0.005 % SOLN 1 drop  1 drop Right Eye QHS Wynetta Fines T, MD       ondansetron [facility]) tablet 4 mg  4 mg Oral Q6H PRN Wynetta Fines T, MD       Or   ondansetron [facility]) injection 4 mg  4 mg Intravenous Q6H PRN Wynetta Fines T, MD   4 mg at 03/16/21 0459   pantoprazole (PROTONIX) EC tablet 40 mg  40 mg Oral Q1200 Wynetta Fines T, MD   40 mg at 03/16/21 1509   pilocarpine (PILOCAR) 4 % ophthalmic solution 1 drop  1 drop Right Eye QID Wynetta Fines T, MD   1 drop at 03/16/21 1013   piperacillin-tazobactam (ZOSYN) IVPB 2.25 g  2.25 g Intravenous Q8H Wynetta Fines T, MD 100 mL/hr at 03/16/21 0510 2.25 g at 03/16/21 0510   polyethylene glycol (MIRALAX / GLYCOLAX) packet 17 g  17 g Oral QHS Wynetta Fines T, MD       sodium chloride flush (NS) 0.9 % injection 5 mL  5 mL Intracatheter Q8H Criselda Peaches, MD       Labs: Basic Metabolic Panel: Recent Labs  Lab 03/15/21 0855 03/16/21 0545  NA 134* 139  K 4.8 5.4*  CL 91* 85*  CO2 23 29  GLUCOSE 179* 249*  BUN 28* 41*  CREATININE 7.25* 8.56*  CALCIUM 9.1 9.2   Liver Function Tests: Recent Labs  Lab 03/15/21 0855 03/16/21 0545  AST 26 48*  ALT 10 14  ALKPHOS 92 98  BILITOT 1.0 1.0  PROT 7.4 8.5*  ALBUMIN 3.2* 2.6*   Recent Labs  Lab 03/15/21 0855  LIPASE 20   No results for input(s): AMMONIA in the  last 168 hours. CBC: Recent Labs  Lab 03/15/21 0855 03/16/21 0251  WBC 19.1* 40.7*  NEUTROABS 16.9*  --   HGB 13.8 15.0  HCT 42.1 45.1  MCV 106.3* 104.6*  PLT 414* 343   Cardiac Enzymes: No results for input(s): CKTOTAL, CKMB, CKMBINDEX, TROPONINI in the last 168 hours. CBG: Recent Labs  Lab 03/16/21 0927 03/16/21 1250  GLUCAP 232* 142*   Iron Studies: No results for input(s): IRON, TIBC, TRANSFERRIN, FERRITIN in the last 72 hours. Studies/Results: DG Chest 1 View  Result Date: 03/15/2021 CLINICAL DATA:  Abdominal pain and vomiting. Clinical concern for aspiration. EXAM: CHEST  1 VIEW COMPARISON:  06/24/2020 FINDINGS: Normal sized heart. Interval minimal patchy density at the right lung base. The left lung remains clear. Aortic valve stent. Thoracic spine degenerative changes. Left axillary surgical clip. IMPRESSION: Minimal right basilar atelectasis, pneumonia or aspiration pneumonitis. Electronically Signed   By: Claudie Revering M.D.   On: 03/15/2021 14:07   US Abdomen Limited  Result Date: 03/15/2021 CLINICAL DATA:  Right upper quadrant pain EXAM: ULTRASOUND ABDOMEN LIMITED RIGHT UPPER QUADRANT COMPARISON:  None. FINDINGS: Gallbladder: Distended with sludge and calculi measuring up to 4 mm. No wall thickening visualized. No  pericholecystic fluid. No sonographic Murphy sign noted by sonographer. Common bile duct: Diameter: 3 mm, normal Liver: Left lobe of liver is poorly visualized due to bowel gas. No focal lesion identified. Within normal limits in parenchymal echogenicity. Portal vein is patent on color Doppler imaging with normal direction of blood flow towards the liver. Other: None. IMPRESSION: Distended gallbladder with sludge and calculi but no additional sonographic evidence of acute cholecystitis. Electronically Signed   By: Macy Mis M.D.   On: 03/15/2021 11:40   IR Perc Cholecystostomy  Result Date: 03/16/2021 Criselda Peaches, MD     03/16/2021 12:08 PM  Interventional Radiology Procedure Note Procedure: Placement of a 36F percutaneous transhepatic cholecystostomy (gallbladder) tube. Complications: None Estimated Blood Loss: None Recommendations: - Bag to drain - Cx sent  Signed, Criselda Peaches, MD    DG CHEST PORT 1 VIEW  Result Date: 03/16/2021 CLINICAL DATA:  Hypoxia EXAM: PORTABLE CHEST 1 VIEW COMPARISON:  Chest x-ray dated March 15, 2021 FINDINGS: Cardiac and mediastinal contours are unchanged within normal limits. Prior transcatheter aortic valve replacement. Interval resolution of right basilar lung opacity, favor resolved atelectasis. No focal consolidation. No large pleural effusion pneumothorax. IMPRESSION: Interval resolution of right basilar lung opacity, favor resolved atelectasis. Electronically Signed   By: Yetta Glassman M.D.   On: 03/16/2021 07:46   CT Renal Stone Study  Result Date: 03/15/2021 CLINICAL DATA:  Right abdominal pain. EXAM: CT ABDOMEN AND PELVIS WITHOUT CONTRAST TECHNIQUE: Multidetector CT imaging of the abdomen and pelvis was performed following the standard protocol without IV contrast. RADIATION DOSE REDUCTION: This exam was performed according to the departmental dose-optimization program which includes automated exposure control, adjustment of the mA and/or kV according to patient size and/or use of iterative reconstruction technique. COMPARISON:  CTA abdomen and pelvis 12/01/2019 FINDINGS: Lower chest: Trace right pleural fluid is new from prior. Unchanged mild bibasilar bronchiectasis. Mild dependent right lower lobe ground-glass likely subsegmental atelectasis. A partially visualized ascending aortic endograft. Dense coronary artery calcifications are seen. No pericardial effusion. Lack of intra-articular fluid limits evaluation of the abdominal and pelvic organ parenchyma. The following findings are made within this limitation. Hepatobiliary: Smooth liver contours. No gross liver lesion is seen. The  gallbladder is again moderately distended with numerous layering small gallstones, similar to prior. No definite gallbladder wall thickening. Pancreas: Unremarkable. No pancreatic ductal dilatation or surrounding inflammatory changes. Spleen: Normal in size without focal abnormality. Adrenals/Urinary Tract: Left adrenal 2.6 cm nodule is unchanged from 11/26/2019 with internal density of 15 Hounsfield units. Right adrenal 15 mm nodule is unchanged in size with internal density of 5 Hounsfield units suggesting a benign lipid rich adenoma. These both again are likely benign. Mild left-greater-than-right renal pelvis vascular calcifications. Mild bilateral renal cortical thinning is again seen. Bilateral moderate extrarenal pelves are again noted. There is again mild-to-moderate dilatation of the bilateral ureters diffusely down to the level of the ureteropelvic junction likely chronic. No obstructing renal stone is seen. No hydronephrosis. Within the limitations of lack of IV contrast, no contour deforming renal mass. The urinary bladder is grossly unremarkable. Stomach/Bowel: No bowel wall thickening. The terminal ileum is unremarkable. The appendix is within normal limits. No dilated loops of bowel to indicate bowel obstruction. Vascular/Lymphatic: No abdominal aortic aneurysm. Dense vascular calcifications. No enlarged abdominal or pelvic lymph nodes. 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 Other: Small fat containing umbilical hernia, unchanged. Unchanged small fat containing left inguinal hernia. No free air or  free fluid. Musculoskeletal: Mild multilevel degenerative disc changes. Old healed lateral right eleventh rib fracture unchanged. IMPRESSION: 1. No nephroureterolithiasis or hydronephrosis. 2. Cholelithiasis. 3. 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 4. Small fat containing umbilical and left inguinal hernias. 5. Trace right  pleural effusion with mild subsegmental atelectasis. 6.  Aortic Atherosclerosis (ICD10-I70.0). Electronically Signed   By: Yvonne Kendall   On: 03/15/2021 11:14    ROS: As per HPI otherwise negative.   Physical Exam: Vitals:   03/16/21 1155 03/16/21 1215 03/16/21 1242 03/16/21 1353  BP: 101/78 (!) 144/63 137/61 115/69  Pulse: 96 61 81 79  Resp: $Remo'20 17 15 16  'UOary$ Temp:   97.7 F (36.5 C) 97.7 F (36.5 C)  TempSrc:   Oral Oral  SpO2: 100% 99% 99% 100%  Weight:      Height:         General: Chronically ill appearing very elderly male in no acute distress. Head: NCAT.  sclera non-icteric, mucus membranes are dry, poor dentition.  Neck: Supple. JVD not elevated. Lungs: Clear bilaterally to auscultation without wheezes, rales, or rhonchi. Breathing is unlabored. Heart: RRR with S1 S2, skipping a beat every 2nd beat. No murmurs, rubs, or gallops appreciated. SR with what appears to be runs of 2nd degree type 2 HB on monitor.  Abdomen: Exquisitely tender RUQ. R percutaneous cholecystostomy tube in place, bilious drainage.  Lower extremities: L BKA no stump edema. No RLE edema.  Neuro: Alert and oriented X 2. Moves all extremities spontaneously. Psych:  Responds to questions appropriately with a normal affect. Dialysis Access: L AVF aneurysmal +T/B  Dialysis Orders: [place] MWF 4 hrs 180 NRE 400/600 66 kg 2.0 K/2.0 Ca UFP 2 AVF -No heparin -No ESA/Venofer -Sensipar 60 mg PO TIW -Hectorol 3 mcg IV TIW   Assessment/Plan:  Acute Cholelithiasis: Per primary. GS/Radiology consulted. Has been started on Zosyn.   Mild hyperkalemia: K+5.4 . Lokelma 10 Gram PO X 1 dose. Recheck K+tonight. HD in AM-put on first shift. Irregular heart rhythm/2nd HB type 2. Getting 12 lead EKG. Hold BB.  Primary notified.   ESRD -  MWF-HD 03/17/2021 on schedule. Will order for first shift. No heparin.    Hypertension/volume  - BP/volume appears stable. UF as tolerated tomorrow. Carvedilol 3.125 mg PO BID on OP med list,  not ordered here.   Anemia  - HGB 15 today. No ESA.   Metabolic bone disease -  OP labs at goal 03/13/2021. On Auryxia binders as OP. HGB high. Corrected Ca 10.3. Change to Renvela binders. Lower VDRA dose. Continue sensipar.   Nutrition - Albumin 2.6. Protein supps ordered. Renal diet.  Parkinson's disease-per primary 10. DMT2-per primary 11. H/O TAVR 12. Pt is a DNR  Slyvester Latona H. Owens Shark, NP-C 03/16/2021, 4:04 PM  [employer] [phone]

## 2021-03-16 NOTE — Procedures (Signed)
Interventional Radiology Procedure Note  Procedure: Placement of a 33F percutaneous transhepatic cholecystostomy (gallbladder) tube.   Complications: None  Estimated Blood Loss: None  Recommendations: - Bag to drain - Cx sent    Signed,  Criselda Peaches, MD

## 2021-03-16 NOTE — Progress Notes (Addendum)
Paged by nephrology in regards to changes on tele.  EKG with ? Type 2 block with RBBB.  Not on BB, CCB.  Patient is s/p TAVR in 2021.  In severe pain so unclear if he is symptomatic.  I have place transfer to cardiac progressive bed and have paged cardiology to discuss. Not a candidate with his infection picture for pacing- discussed with cards.  Will continue to monitor on tele.  Patient is s/p drain for presumed cholecystitis. Elijah White Updated daughter on phone-- please let her know what room he goes to

## 2021-03-16 NOTE — Plan of Care (Signed)
Care plan initiated.

## 2021-03-16 NOTE — Progress Notes (Signed)
Progress Note    Elijah White  WUJ:[id number] DOB: 1940/04/02  DOA: 03/15/2021 PCP: Maryella Shivers, MD    Brief Narrative:    Medical records reviewed and are as summarized below:  Elijah White is an 81 y.o. male with medical history significant of ESRD on HD MWF, aortic stenosis status post TAVR 2021, nonobstructive CAD, Parkinson's disease, PVD on Plavix, s/p left BKA, HTN, IIDM, HLD,  came with recurrent right upper quadrant abdominal pain.    Assessment/Plan:   Principal Problem:   Acute cholecystitis  Acute RUQ abdominal pain with leucocytosis -Secondary to cholelithiasis, suspect early acute cholecystitis with clinical Murphy sign positive. -IV abx -GS consult IR consult for percutaneous drain   ESRD on HD -nephrology consulted.   HTN -resume meds as able   Parkinson's disease -continue Sinemet   Diabetic neuropathy -Continue gabapentin  Hyperglycemia -SSI   Family Communication/Anticipated D/C date and plan/Code Status   DVT prophylaxis: Lovenox ordered. Code Status: DNR Disposition Plan: Status is: Inpatient  Remains inpatient appropriate because: needs IV abx         Medical Consultants:   IR GS nephrology  Subjective:   Having severe pain in abdomen  Objective:    Vitals:   03/16/21 0925 03/16/21 1125 03/16/21 1155 03/16/21 1215  BP: 133/74 (!) 110/58 101/78 (!) 144/63  Pulse: 78 64 96 61  Resp: 17 16 20 17   Temp: 98.2 F (36.8 C)     TempSrc: Oral     SpO2: 100% 100% 100% 99%  Weight:      Height:        Intake/Output Summary (Last 24 hours) at 03/16/2021 1259 Last data filed at 03/16/2021 0900 Gross per 24 hour  Intake 89.86 ml  Output --  Net 89.86 ml   Filed Weights   03/15/21 0824  Weight: 64.9 kg    Exam:  General: Appearance:    Chronically ill appearing male who is in pain     Lungs:      respirations unlabored     MS:   Below knee amputation of left lower extremity is noted.  Neurologic:    Will awaken and answer basic questions      Data Reviewed:   I have personally reviewed following labs and imaging studies:  Labs: Labs show the following:   Basic Metabolic Panel: Recent Labs  Lab 03/15/21 0855 03/16/21 0545  NA 134* 139  K 4.8 5.4*  CL 91* 85*  CO2 23 29  GLUCOSE 179* 249*  BUN 28* 41*  CREATININE 7.25* 8.56*  CALCIUM 9.1 9.2   GFR Estimated Creatinine Clearance: 5.8 mL/min (A) (by C-G formula based on SCr of 8.56 mg/dL (H)). Liver Function Tests: Recent Labs  Lab 03/15/21 0855 03/16/21 0545  AST 26 48*  ALT 10 14  ALKPHOS 92 98  BILITOT 1.0 1.0  PROT 7.4 8.5*  ALBUMIN 3.2* 2.6*   Recent Labs  Lab 03/15/21 0855  LIPASE 20   No results for input(s): AMMONIA in the last 168 hours. Coagulation profile No results for input(s): INR, PROTIME in the last 168 hours.  CBC: Recent Labs  Lab 03/15/21 0855 03/16/21 0251  WBC 19.1* 40.7*  NEUTROABS 16.9*  --   HGB 13.8 15.0  HCT 42.1 45.1  MCV 106.3* 104.6*  PLT 414* 343   Cardiac Enzymes: No results for input(s): CKTOTAL, CKMB, CKMBINDEX, TROPONINI in the last 168 hours. BNP (last 3 results) No results for input(s): PROBNP in the  last 8760 hours. CBG: Recent Labs  Lab 03/16/21 0927 03/16/21 1250  GLUCAP 232* 142*   D-Dimer: No results for input(s): DDIMER in the last 72 hours. Hgb A1c: No results for input(s): HGBA1C in the last 72 hours. Lipid Profile: No results for input(s): CHOL, HDL, LDLCALC, TRIG, CHOLHDL, LDLDIRECT in the last 72 hours. Thyroid function studies: No results for input(s): TSH, T4TOTAL, T3FREE, THYROIDAB in the last 72 hours.  Invalid input(s): FREET3 Anemia work up: No results for input(s): VITAMINB12, FOLATE, FERRITIN, TIBC, IRON, RETICCTPCT in the last 72 hours. Sepsis Labs: Recent Labs  Lab 03/15/21 0855 03/16/21 0251  WBC 19.1* 40.7*    Microbiology Recent Results (from the past 240 hour(s))  Resp Panel by RT-PCR (Flu A&B, Covid)  Nasopharyngeal Swab     Status: None   Collection Time: 03/15/21  1:35 PM   Specimen: Nasopharyngeal Swab; Nasopharyngeal(NP) swabs in vial transport medium  Result Value Ref Range Status   SARS Coronavirus 2 by RT PCR NEGATIVE NEGATIVE Final    Comment: (NOTE) SARS-CoV-2 target nucleic acids are NOT DETECTED.  The SARS-CoV-2 RNA is generally detectable in upper respiratory specimens during the acute phase of infection. The lowest concentration of SARS-CoV-2 viral copies this assay can detect is 138 copies/mL. A negative result does not preclude SARS-Cov-2 infection and should not be used as the sole basis for treatment or other patient management decisions. A negative result may occur with  improper specimen collection/handling, submission of specimen other than nasopharyngeal swab, presence of viral mutation(s) within the areas targeted by this assay, and inadequate number of viral copies(<138 copies/mL). A negative result must be combined with clinical observations, patient history, and epidemiological information. The expected result is Negative.  Fact Sheet for Patients:  EntrepreneurPulse.com.au  Fact Sheet for Healthcare Providers:  IncredibleEmployment.be  This test is no t yet approved or cleared by the [place] FDA and  has been authorized for detection and/or diagnosis of SARS-CoV-2 by FDA under an Emergency Use Authorization (EUA). This EUA will remain  in effect (meaning this test can be used) for the duration of the COVID-19 declaration under Section 564(b)(1) of the Act, 21 U.S.C.section 360bbb-3(b)(1), unless the authorization is terminated  or revoked sooner.       Influenza A by PCR NEGATIVE NEGATIVE Final   Influenza B by PCR NEGATIVE NEGATIVE Final    Comment: (NOTE) The Xpert Xpress SARS-CoV-2/FLU/RSV plus assay is intended as an aid in the diagnosis of influenza from Nasopharyngeal swab specimens and should not be  used as a sole basis for treatment. Nasal washings and aspirates are unacceptable for Xpert Xpress SARS-CoV-2/FLU/RSV testing.  Fact Sheet for Patients: EntrepreneurPulse.com.au  Fact Sheet for Healthcare Providers: IncredibleEmployment.be  This test is not yet approved or cleared by the [place] FDA and has been authorized for detection and/or diagnosis of SARS-CoV-2 by FDA under an Emergency Use Authorization (EUA). This EUA will remain in effect (meaning this test can be used) for the duration of the COVID-19 declaration under Section 564(b)(1) of the Act, 21 U.S.C. section 360bbb-3(b)(1), unless the authorization is terminated or revoked.  Performed at [facility], [place] [address], [place], [place] [address]     Procedures and diagnostic studies:  DG Chest 1 View  Result Date: 03/15/2021 CLINICAL DATA:  Abdominal pain and vomiting. Clinical concern for aspiration. EXAM: CHEST  1 VIEW COMPARISON:  06/24/2020 FINDINGS: Normal sized heart. Interval minimal patchy density at the right lung base. The left lung remains  clear. Aortic valve stent. Thoracic spine degenerative changes. Left axillary surgical clip. IMPRESSION: Minimal right basilar atelectasis, pneumonia or aspiration pneumonitis. Electronically Signed   By: Claudie Revering M.D.   On: 03/15/2021 14:07   US Abdomen Limited  Result Date: 03/15/2021 CLINICAL DATA:  Right upper quadrant pain EXAM: ULTRASOUND ABDOMEN LIMITED RIGHT UPPER QUADRANT COMPARISON:  None. FINDINGS: Gallbladder: Distended with sludge and calculi measuring up to 4 mm. No wall thickening visualized. No pericholecystic fluid. No sonographic Murphy sign noted by sonographer. Common bile duct: Diameter: 3 mm, normal Liver: Left lobe of liver is poorly visualized due to bowel gas. No focal lesion identified. Within normal limits in parenchymal echogenicity. Portal vein is patent on color Doppler imaging with normal  direction of blood flow towards the liver. Other: None. IMPRESSION: Distended gallbladder with sludge and calculi but no additional sonographic evidence of acute cholecystitis. Electronically Signed   By: Macy Mis M.D.   On: 03/15/2021 11:40   IR Perc Cholecystostomy  Result Date: 03/16/2021 Criselda Peaches, MD     03/16/2021 12:08 PM Interventional Radiology Procedure Note Procedure: Placement of a 63F percutaneous transhepatic cholecystostomy (gallbladder) tube. Complications: None Estimated Blood Loss: None Recommendations: - Bag to drain - Cx sent  Signed, Criselda Peaches, MD    DG CHEST PORT 1 VIEW  Result Date: 03/16/2021 CLINICAL DATA:  Hypoxia EXAM: PORTABLE CHEST 1 VIEW COMPARISON:  Chest x-ray dated March 15, 2021 FINDINGS: Cardiac and mediastinal contours are unchanged within normal limits. Prior transcatheter aortic valve replacement. Interval resolution of right basilar lung opacity, favor resolved atelectasis. No focal consolidation. No large pleural effusion pneumothorax. IMPRESSION: Interval resolution of right basilar lung opacity, favor resolved atelectasis. Electronically Signed   By: Yetta Glassman M.D.   On: 03/16/2021 07:46   CT Renal Stone Study  Result Date: 03/15/2021 CLINICAL DATA:  Right abdominal pain. EXAM: CT ABDOMEN AND PELVIS WITHOUT CONTRAST TECHNIQUE: Multidetector CT imaging of the abdomen and pelvis was performed following the standard protocol without IV contrast. RADIATION DOSE REDUCTION: This exam was performed according to the departmental dose-optimization program which includes automated exposure control, adjustment of the mA and/or kV according to patient size and/or use of iterative reconstruction technique. COMPARISON:  CTA abdomen and pelvis 12/01/2019 FINDINGS: Lower chest: Trace right pleural fluid is new from prior. Unchanged mild bibasilar bronchiectasis. Mild dependent right lower lobe ground-glass likely subsegmental atelectasis. A  partially visualized ascending aortic endograft. Dense coronary artery calcifications are seen. No pericardial effusion. Lack of intra-articular fluid limits evaluation of the abdominal and pelvic organ parenchyma. The following findings are made within this limitation. Hepatobiliary: Smooth liver contours. No gross liver lesion is seen. The gallbladder is again moderately distended with numerous layering small gallstones, similar to prior. No definite gallbladder wall thickening. Pancreas: Unremarkable. No pancreatic ductal dilatation or surrounding inflammatory changes. Spleen: Normal in size without focal abnormality. Adrenals/Urinary Tract: Left adrenal 2.6 cm nodule is unchanged from 11/26/2019 with internal density of 15 Hounsfield units. Right adrenal 15 mm nodule is unchanged in size with internal density of 5 Hounsfield units suggesting a benign lipid rich adenoma. These both again are likely benign. Mild left-greater-than-right renal pelvis vascular calcifications. Mild bilateral renal cortical thinning is again seen. Bilateral moderate extrarenal pelves are again noted. There is again mild-to-moderate dilatation of the bilateral ureters diffusely down to the level of the ureteropelvic junction likely chronic. No obstructing renal stone is seen. No hydronephrosis. Within the limitations of lack of IV contrast, no contour  deforming renal mass. The urinary bladder is grossly unremarkable. Stomach/Bowel: No bowel wall thickening. The terminal ileum is unremarkable. The appendix is within normal limits. No dilated loops of bowel to indicate bowel obstruction. Vascular/Lymphatic: No abdominal aortic aneurysm. Dense vascular calcifications. No enlarged abdominal or pelvic lymph nodes. 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 Other: Small fat containing umbilical hernia, unchanged. Unchanged small fat containing left inguinal hernia. No free air or free fluid.  Musculoskeletal: Mild multilevel degenerative disc changes. Old healed lateral right eleventh rib fracture unchanged. IMPRESSION: 1. No nephroureterolithiasis or hydronephrosis. 2. Cholelithiasis. 3. 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 4. Small fat containing umbilical and left inguinal hernias. 5. Trace right pleural effusion with mild subsegmental atelectasis. 6.  Aortic Atherosclerosis (ICD10-I70.0). Electronically Signed   By: Yvonne Kendall   On: 03/15/2021 11:14    Medications:    aspirin EC  81 mg Oral q AM   atorvastatin  10 mg Oral q AM   brimonidine  1 drop Right Eye TID   carbidopa-levodopa  1 tablet Oral TID   docusate sodium  100 mg Oral BID   dorzolamide-timolol  1 drop Right Eye BID   fentaNYL       ferric citrate  420 mg Oral TID WC   gabapentin  100 mg Oral Daily   HYDROmorphone       insulin aspart  0-6 Units Subcutaneous Q4H   lidocaine       midazolam       Netarsudil-Latanoprost  1 drop Right Eye QHS   pantoprazole  40 mg Oral Q1200   pilocarpine  1 drop Right Eye QID   polyethylene glycol  17 g Oral QHS   sodium chloride flush  5 mL Intracatheter Q8H   Continuous Infusions:  piperacillin-tazobactam (ZOSYN)  IV 2.25 g (03/16/21 0510)     LOS: 1 day   Geradine Girt  [facility]   How to contact the [facility] Attending or Consulting provider [place] or covering provider during after hours [place], for this patient?  Check the care team in [facility] and look for a) attending/consulting [facility] provider listed and b) the [facility] team listed Log into [email or web address] and use [place]'s universal password to access. If you do not have the password, please contact the hospital operator. Locate the [facility] provider you are looking for under [facility] and page to a number that you can be directly reached. If you still have difficulty reaching the provider, please page the [facility] (Director on Call) for the Hospitalists listed on amion for  assistance.  03/16/2021, 12:59 PM

## 2021-03-16 NOTE — Significant Event (Signed)
Rapid Response Event Note   Reason for Call :  Hypotension, tachycardia, tachypnea  Initial Focused Assessment:  Pt lying in bed with eyes closed. He is oriented x 4, although confused at times. He is c/o 10/10 ABD pain, denies chest pain/SOB. He is breathing very shallow-lung sounds are diminished. ABD is soft/distended/tender. Skin is cool/clammy.   T-97.7(R), HR-115, BP-135/73, RR-21, SpO2-99% on 3L [place]   Interventions:  1mg  dilaudid (PRN order) 4mg  zofran(PRN order) CBC/LA BC Hemoccult stool  250cc NS 10mg  lokelma EKG Plan of Care:  Pt resting after interventions. Obtain EKG, give bolus, lokelma. Await labs results. Continue to monitor pt closely. Call RRT if further assistance needed.    Event Summary:   MD Notified: Dr. Cyd Silence notified Call [phone] Arrival [phone] End Time:2330  Dillard Essex, RN

## 2021-03-16 NOTE — Consult Note (Signed)
Chief Complaint: Patient was seen in consultation today for acute cholecystitis  Referring Physician(s): CCS  Supervising Physician: Jacqulynn Cadet  Patient Status: [facility] - In-pt  History of Present Illness: Elijah White is a 81 y.o. male with past medical history of HTN, HLD, ESRD on HD , DM, aortic stenosis s/p recent TAVR on Plavix at home, prior L BKA who presented to [facility] ED with abdominal pain.  US Abdomen Limited shows concern for acute cholecystitis. Patient was proceeding with surgical work-up with plans for HIDA scan today, however with clinical change overnight now with WBC 40K, requiring supplemental O2, and increased lethargy. IR now consulted for percutaneous cholecystostomy tube placement.   Patient assessed at bedside this AM.  He appears ill, he is in pain. He is legally blind.  He understands that he is not going for surgery and that drainage of his gall bladder is required for decompression and infection management. He is agreeable.   Past Medical History:  Diagnosis Date   Anemia    low iron   Aortic stenosis    s/p TAVR 01/19/20   Arthritis    Cancer [facility])    prostate, s/p I-125 seed implant 05/18/05   Colon polyps ~ 1993 and 2003   Dr [name], [facility].  08/2001 colonoscopy: tubular adenoma at cecum.     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   Elevated cholesterol with high triglycerides    ESRD (end stage renal disease) (HCC)    [facility]   GERD (gastroesophageal reflux disease)    Glaucoma    Hypertension    Legally blind in left eye, as defined in [place]    has pinpoint vision in right eye   PAD (peripheral artery disease) ([place])    RBBB     Past Surgical History:  Procedure Laterality Date   ABDOMINAL AORTOGRAM W/LOWER EXTREMITY N/A 07/25/2020   Procedure: ABDOMINAL AORTOGRAM W/LOWER EXTREMITY;  Surgeon: Marty Heck, MD;  Location: [place] [facility];  Service: Cardiovascular;  Laterality:  N/A;   AMPUTATION Left 08/12/2020   Procedure: LEFT BELOW KNEE AMPUTATION;  Surgeon: Marty Heck, MD;  Location: [place];  Service: Vascular;  Laterality: Left;   AV FISTULA PLACEMENT Left 03/20/2018   Procedure: ARTERIOVENOUS (AV) FISTULA CREATION ARM;  Surgeon: [name], MD;  Location: [place];  Service: Vascular;  Laterality: Left;   [place] Left 05/21/2018   Procedure: LEFT BASILIC VEIN FISTULA SECOND STAGE;  Surgeon: [name], MD;  Location: [place];  Service: Vascular;  Laterality: Left;   COLONOSCOPY     COLONOSCOPY WITH PROPOFOL N/A 11/30/2019   Procedure: COLONOSCOPY WITH PROPOFOL;  Surgeon: Ronnette Juniper, MD;  Location: [place];  Service: Gastroenterology;  Laterality: N/A;   GLAUCOMA SURGERY  2019   multiple surgeries   HEMOSTASIS CLIP PLACEMENT  11/30/2019   Procedure: HEMOSTASIS CLIP PLACEMENT;  Surgeon: Ronnette Juniper, MD;  Location: [facility] ENDOSCOPY;  Service: Gastroenterology;;   KNEE ARTHROSCOPY     MULTIPLE EXTRACTIONS WITH ALVEOLOPLASTY N/A 12/30/2019   Procedure: MULTIPLE EXTRACTION WITH ALVEOLOPLASTY;  Surgeon: Charlaine Dalton, DMD;  Location: [place];  Service: Dentistry;  Laterality: N/A;   PERIPHERAL VASCULAR INTERVENTION Left 07/25/2020   Procedure: PERIPHERAL VASCULAR INTERVENTION;  Surgeon: Marty Heck, MD;  Location: [place] [facility];  Service: Cardiovascular;  Laterality: Left;  superficial femoral   POLYPECTOMY  11/30/2019   Procedure: POLYPECTOMY;  Surgeon: Therisa Doyne,  Megan Salon, MD;  Location: [place];  Service: Gastroenterology;;   PROSTATE SURGERY     RIGHT/LEFT HEART CATH AND CORONARY ANGIOGRAPHY N/A 12/03/2019   Procedure: RIGHT/LEFT HEART CATH AND CORONARY ANGIOGRAPHY;  Surgeon: Burnell Blanks, MD;  Location: [place] [facility];  Service: Cardiovascular;  Laterality: N/A;   SUBMUCOSAL TATTOO INJECTION  11/30/2019   Procedure: SUBMUCOSAL TATTOO INJECTION;  Surgeon: Ronnette Juniper, MD;  Location: [place];  Service: Gastroenterology;;   TEE WITHOUT CARDIOVERSION N/A 01/19/2020   Procedure: TRANSESOPHAGEAL ECHOCARDIOGRAM (TEE);  Surgeon: Burnell Blanks, MD;  Location: [place] [facility];  Service: Open Heart Surgery;  Laterality: N/A;   TRANSCATHETER AORTIC VALVE REPLACEMENT, TRANSFEMORAL Left 01/19/2020   Procedure: TRANSCATHETER AORTIC VALVE REPLACEMENT, LEFT TRANSFEMORAL;  Surgeon: Burnell Blanks, MD;  Location: [place] [facility];  Service: Open Heart Surgery;  Laterality: Left;    Allergies: Patient has no known allergies.  Medications: Prior to Admission medications   Medication Sig Start Date End Date Taking? Authorizing Provider  acetaminophen (TYLENOL) 500 MG tablet Take 1,000 mg by mouth every 6 (six) hours as needed for moderate pain or headache.   Yes [provider]  aspirin EC 81 MG tablet Take 81 mg by mouth in the morning. Swallow whole.   Yes [provider]  atorvastatin (LIPITOR) 10 MG tablet Take 1 tablet (10 mg total) by mouth in the morning. 08/16/20  Yes Rhyne, Samantha J, PA-C  AURYXIA 1 GM 210 MG(Fe) tablet Take 420 mg by mouth 3 (three) times daily with meals. 05/30/20  Yes [provider]  B Complex-C-Folic Acid (DIALYVITE 283) 0.8 MG TABS Take 1 tablet by mouth in the morning. 05/08/19  Yes [provider]  brimonidine (ALPHAGAN) 0.15 % ophthalmic solution Place 1 drop into the right eye 3 (three) times daily.   Yes [provider]  carbidopa-levodopa (SINEMET) 25-100 MG tablet Take 1 tablet by mouth 3 (three) times daily. Patient taking differently: Take 1 tablet by mouth 3 (three) times daily. 1 TABLET @ 8am/ 12pm/ 4pm 10/19/20 03/15/21 Yes Curatolo, Adam, DO  clopidogrel (PLAVIX) 75 MG tablet Take 1 tablet (75 mg total) by mouth daily with breakfast. 07/25/20  Yes Marty Heck, MD  docusate sodium (COLACE) 100 MG capsule Take 100 mg by mouth 2 (two) times daily.   Yes [provider]   dorzolamide-timolol (COSOPT) 22.3-6.8 MG/ML ophthalmic solution Place 1 drop into the right eye 2 (two) times daily. 06/25/17  Yes [provider]  gabapentin (NEURONTIN) 100 MG capsule Take 1 capsule (100 mg total) by mouth daily. 10/19/20 03/15/21 Yes Curatolo, Adam, DO  isosorbide mononitrate (IMDUR) 30 MG 24 hr tablet Take 1 tablet (30 mg total) by mouth in the morning. 08/16/20  Yes Rhyne, Hulen Shouts, PA-C  Melatonin 5 MG CAPS Take 5 mg by mouth at bedtime.   Yes [provider]  pantoprazole (PROTONIX) 40 MG tablet Take 1 tablet (40 mg total) by mouth daily at 12 noon. 08/16/20  Yes Rhyne, Hulen Shouts, PA-C  pilocarpine (PILOCAR) 4 % ophthalmic solution Place 1 drop into the right eye 4 (four) times daily.    Yes [provider]  polyethylene glycol (MIRALAX / GLYCOLAX) 17 g packet Take 17 g by mouth at bedtime.   Yes [provider]  ROCKLATAN 0.02-0.005 % SOLN Place 1 drop into the right eye at bedtime. 06/20/20  Yes [provider]  blood glucose meter kit and supplies KIT Dispense based on patient  and insurance preference. Use up to four times daily as directed. (FOR ICD-9 250.00, 250.01). For QAC - HS accuchecks. 03/30/18   Thurnell Lose, MD  carvedilol (COREG) 3.125 MG tablet Take 1 tablet (3.125 mg total) by mouth 2 (two) times daily with a meal. Patient not taking: Reported on 03/15/2021 08/16/20   Gabriel Earing, PA-C  Doxercalciferol (HECTOROL IV) Doxercalciferol (Hectorol) 08/09/20 08/28/21  [provider]  Lancets (ONETOUCH DELICA PLUS [id number]) [place]  03/31/18   [provider]  Methoxy PEG-Epoetin Beta (MIRCERA IJ) [facility] 09/05/20 09/04/21  [provider]  ONETOUCH VERIO test strip USE AS DIRECTED TO TEST FOUR TIMES A DAY 07/31/18   [name], MD  oxyCODONE-acetaminophen (PERCOCET) 5-325 MG tablet Take 1 tablet by mouth every 6 (six) hours as needed (pain). Patient not taking: Reported on 03/15/2021 08/19/20    Karoline Caldwell, PA-C     Family History  Problem Relation Age of Onset   CAD Mother     Social History   Socioeconomic History   Marital status: Widowed    Spouse name: Not on file   Number of children: Not on file   Years of education: Not on file   Highest education level: Not on file  Occupational History   Occupation: retired  Tobacco Use   Smoking status: Former    Packs/day: 0.50    Types: Cigarettes    Quit date: 03/23/2018    Years since quitting: 2.9   Smokeless tobacco: Never  Vaping Use   Vaping Use: Never used  Substance and Sexual Activity   Alcohol use: No   Drug use: No   Sexual activity: Not Currently  Other Topics Concern   Not on file  Social History Narrative   Right handed    Facility   Social Determinants of Health   Financial Resource Strain: Low Risk    Difficulty of Paying Living Expenses: Not hard at all  Food Insecurity: No Food Insecurity   Worried About Charity fundraiser in the Last Year: Never true   [place] in the Last Year: Never true  Transportation Needs: No Transportation Needs   Lack of Transportation (Medical): No   Lack of Transportation (Non-Medical): No  Physical Activity: Inactive   Days of Exercise per Week: 0 days   Minutes of Exercise per Session: 0 min  Stress: No Stress Concern Present   Feeling of Stress : Not at all  Social Connections: Not on file     Review of Systems: A 12 point ROS discussed and pertinent positives are indicated in the HPI above.  All other systems are negative.  Review of Systems  Constitutional:  Negative for fatigue and fever.  Respiratory:  Negative for cough and shortness of breath.   Cardiovascular:  Negative for chest pain.  Gastrointestinal:  Positive for abdominal pain and nausea. Negative for vomiting.  Genitourinary:  Negative for dysuria.  Neurological:  Negative for headaches.  Psychiatric/Behavioral:  Negative for behavioral problems and confusion.    Vital  Signs: BP 133/74 (BP Location: Right Arm)    Pulse 78    Temp 98.2 F (36.8 C) (Oral)    Resp 17    Ht _0  (1.651 m)    Wt 143 lb 1.3 oz (64.9 kg)    SpO2 100%    BMI 23.81 kg/m   Physical Exam Vitals and nursing note reviewed.  Constitutional:      General: He is not in acute  distress.    Appearance: He is well-developed. He is not ill-appearing.  Cardiovascular:     Rate and Rhythm: Tachycardia present.     Comments: Occasional extraneous beat Pulmonary:     Effort: Pulmonary effort is normal.     Breath sounds: Normal breath sounds.  Abdominal:     General: Abdomen is flat. There is no distension.  Skin:    General: Skin is warm and dry.  Neurological:     General: No focal deficit present.     Mental Status: He is alert and oriented to person, place, and time.  Psychiatric:        Mood and Affect: Mood normal.        Behavior: Behavior normal.     MD Evaluation Airway: WNL Heart: WNL Abdomen: WNL Chest/ Lungs: WNL ASA  Classification: 3 Mallampati/Airway Score: Two   Imaging: DG Chest 1 View  Result Date: 03/15/2021 CLINICAL DATA:  Abdominal pain and vomiting. Clinical concern for aspiration. EXAM: CHEST  1 VIEW COMPARISON:  06/24/2020 FINDINGS: Normal sized heart. Interval minimal patchy density at the right lung base. The left lung remains clear. Aortic valve stent. Thoracic spine degenerative changes. Left axillary surgical clip. IMPRESSION: Minimal right basilar atelectasis, pneumonia or aspiration pneumonitis. Electronically Signed   By: Claudie Revering M.D.   On: 03/15/2021 14:07   US Abdomen Limited  Result Date: 03/15/2021 CLINICAL DATA:  Right upper quadrant pain EXAM: ULTRASOUND ABDOMEN LIMITED RIGHT UPPER QUADRANT COMPARISON:  None. FINDINGS: Gallbladder: Distended with sludge and calculi measuring up to 4 mm. No wall thickening visualized. No pericholecystic fluid. No sonographic Murphy sign noted by sonographer. Common bile duct: Diameter: 3 mm, normal  Liver: Left lobe of liver is poorly visualized due to bowel gas. No focal lesion identified. Within normal limits in parenchymal echogenicity. Portal vein is patent on color Doppler imaging with normal direction of blood flow towards the liver. Other: None. IMPRESSION: Distended gallbladder with sludge and calculi but no additional sonographic evidence of acute cholecystitis. Electronically Signed   By: Macy Mis M.D.   On: 03/15/2021 11:40   DG CHEST PORT 1 VIEW  Result Date: 03/16/2021 CLINICAL DATA:  Hypoxia EXAM: PORTABLE CHEST 1 VIEW COMPARISON:  Chest x-ray dated March 15, 2021 FINDINGS: Cardiac and mediastinal contours are unchanged within normal limits. Prior transcatheter aortic valve replacement. Interval resolution of right basilar lung opacity, favor resolved atelectasis. No focal consolidation. No large pleural effusion pneumothorax. IMPRESSION: Interval resolution of right basilar lung opacity, favor resolved atelectasis. Electronically Signed   By: Yetta Glassman M.D.   On: 03/16/2021 07:46   CT Renal Stone Study  Result Date: 03/15/2021 CLINICAL DATA:  Right abdominal pain. EXAM: CT ABDOMEN AND PELVIS WITHOUT CONTRAST TECHNIQUE: Multidetector CT imaging of the abdomen and pelvis was performed following the standard protocol without IV contrast. RADIATION DOSE REDUCTION: This exam was performed according to the departmental dose-optimization program which includes automated exposure control, adjustment of the mA and/or kV according to patient size and/or use of iterative reconstruction technique. COMPARISON:  CTA abdomen and pelvis 12/01/2019 FINDINGS: Lower chest: Trace right pleural fluid is new from prior. Unchanged mild bibasilar bronchiectasis. Mild dependent right lower lobe ground-glass likely subsegmental atelectasis. A partially visualized ascending aortic endograft. Dense coronary artery calcifications are seen. No pericardial effusion. Lack of intra-articular fluid limits  evaluation of the abdominal and pelvic organ parenchyma. The following findings are made within this limitation. Hepatobiliary: Smooth liver contours. No gross liver lesion is seen. The  gallbladder is again moderately distended with numerous layering small gallstones, similar to prior. No definite gallbladder wall thickening. Pancreas: Unremarkable. No pancreatic ductal dilatation or surrounding inflammatory changes. Spleen: Normal in size without focal abnormality. Adrenals/Urinary Tract: Left adrenal 2.6 cm nodule is unchanged from 11/26/2019 with internal density of 15 Hounsfield units. Right adrenal 15 mm nodule is unchanged in size with internal density of 5 Hounsfield units suggesting a benign lipid rich adenoma. These both again are likely benign. Mild left-greater-than-right renal pelvis vascular calcifications. Mild bilateral renal cortical thinning is again seen. Bilateral moderate extrarenal pelves are again noted. There is again mild-to-moderate dilatation of the bilateral ureters diffusely down to the level of the ureteropelvic junction likely chronic. No obstructing renal stone is seen. No hydronephrosis. Within the limitations of lack of IV contrast, no contour deforming renal mass. The urinary bladder is grossly unremarkable. Stomach/Bowel: No bowel wall thickening. The terminal ileum is unremarkable. The appendix is within normal limits. No dilated loops of bowel to indicate bowel obstruction. Vascular/Lymphatic: No abdominal aortic aneurysm. Dense vascular calcifications. No enlarged abdominal or pelvic lymph nodes. 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 Other: Small fat containing umbilical hernia, unchanged. Unchanged small fat containing left inguinal hernia. No free air or free fluid. Musculoskeletal: Mild multilevel degenerative disc changes. Old healed lateral right eleventh rib fracture unchanged. IMPRESSION: 1. No nephroureterolithiasis or  hydronephrosis. 2. Cholelithiasis. 3. 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 4. Small fat containing umbilical and left inguinal hernias. 5. Trace right pleural effusion with mild subsegmental atelectasis. 6.  Aortic Atherosclerosis (ICD10-I70.0). Electronically Signed   By: Yvonne Kendall   On: 03/15/2021 11:14    Labs:  CBC: Recent Labs    09/26/20 2102 10/19/20 1332 03/15/21 0855 03/16/21 0251  WBC 5.4 4.9 19.1* 40.7*  HGB 11.7* 12.6* 13.8 15.0  HCT 36.5* 38.4* 42.1 45.1  PLT 227 224 414* 343    COAGS: No results for input(s): INR, APTT in the last 8760 hours.  BMP: Recent Labs    09/26/20 2102 10/19/20 1332 03/15/21 0855 03/16/21 0545  NA 139 135 134* 139  K 2.9* 3.9 4.8 5.4*  CL 96* 92* 91* 85*  CO2 _0 GLUCOSE 127* 112* 179* 249*  BUN 7* 10 28* 41*  CALCIUM 10.0 8.8* 9.1 9.2  CREATININE 4.15* 3.50* 7.25* 8.56*  GFRNONAA 14* 17* 7* 6*    LIVER FUNCTION TESTS: Recent Labs    09/26/20 2102 10/19/20 1356 03/15/21 0855 03/16/21 0545  BILITOT 0.9 0.6 1.0 1.0  AST _1 48*  ALT _2 ALKPHOS 81 73 92 98  PROT 6.2* 7.1 7.4 8.5*  ALBUMIN 3.3* 3.8 3.2* 2.6*    TUMOR MARKERS: No results for input(s): AFPTM, CEA, CA199, CHROMGRNA in the last 8760 hours.  Assessment and Plan: Acute cholecystitis Patient with gall bladder wall thickening and distention by ultrasound.  Signs of acute infection with clinical worsening overnight.  WBC 19  40K VSS Note K 5.4 Patient is oriented and conversant at time of visit today.  He understands the goals of the procedure.  He understands drain will remain in place for now and asks how this will be managed at his nursing home.  Answered all questions.  Plan for perc chole placement today in IR.   Risks and benefits discussed with the patient including, but not limited to bleeding, infection, gallbladder perforation, bile leak, sepsis or even death.  All of  the patient's  questions were answered, patient is agreeable to proceed. Consent signed and in chart.  Thank you for this interesting consult.  I greatly enjoyed meeting Elijah White and look forward to participating in their care.  A copy of this report was sent to the requesting provider on this date.  Electronically Signed: Docia Barrier, PA 03/16/2021, 10:29 AM   I spent a total of 40 Minutes    in face to face in clinical consultation, greater than 50% of which was counseling/coordinating care for acute cholecystitis.

## 2021-03-17 ENCOUNTER — Inpatient Hospital Stay (HOSPITAL_COMMUNITY): Payer: Medicare Other

## 2021-03-17 DIAGNOSIS — I9589 Other hypotension: Secondary | ICD-10-CM | POA: Diagnosis not present

## 2021-03-17 DIAGNOSIS — I4891 Unspecified atrial fibrillation: Secondary | ICD-10-CM

## 2021-03-17 DIAGNOSIS — K810 Acute cholecystitis: Secondary | ICD-10-CM

## 2021-03-17 LAB — BASIC METABOLIC PANEL
Anion gap: 19 — ABNORMAL HIGH (ref 5–15)
BUN: 64 mg/dL — ABNORMAL HIGH (ref 8–23)
CO2: 27 mmol/L (ref 22–32)
Calcium: 8.6 mg/dL — ABNORMAL LOW (ref 8.9–10.3)
Chloride: 91 mmol/L — ABNORMAL LOW (ref 98–111)
Creatinine, Ser: 9.84 mg/dL — ABNORMAL HIGH (ref 0.61–1.24)
GFR, Estimated: 5 mL/min — ABNORMAL LOW (ref >60)
Glucose, Bld: 140 mg/dL — ABNORMAL HIGH (ref 70–99)
Potassium: 6 mmol/L — ABNORMAL HIGH (ref 3.5–5.1)
Sodium: 137 mmol/L (ref 135–145)

## 2021-03-17 LAB — HEPATITIS B SURFACE ANTIGEN: Hepatitis B Surface Ag: NONREACTIVE

## 2021-03-17 LAB — CBC
HCT: 41.8 % (ref 39.0–52.0)
HCT: 40.3 % (ref 39.0–52.0)
HCT: 36.6 % — ABNORMAL LOW (ref 39.0–52.0)
Hemoglobin: 14.1 g/dL (ref 13.0–17.0)
Hemoglobin: 13.2 g/dL (ref 13.0–17.0)
Hemoglobin: 11.9 g/dL — ABNORMAL LOW (ref 13.0–17.0)
MCH: 35.1 pg — ABNORMAL HIGH (ref 26.0–34.0)
MCH: 34.6 pg — ABNORMAL HIGH (ref 26.0–34.0)
MCH: 34.1 pg — ABNORMAL HIGH (ref 26.0–34.0)
MCHC: 33.7 g/dL (ref 30.0–36.0)
MCHC: 32.8 g/dL (ref 30.0–36.0)
MCHC: 32.5 g/dL (ref 30.0–36.0)
MCV: 104 fL — ABNORMAL HIGH (ref 80.0–100.0)
MCV: 105.5 fL — ABNORMAL HIGH (ref 80.0–100.0)
MCV: 104.9 fL — ABNORMAL HIGH (ref 80.0–100.0)
Platelets: 491 10*3/uL — ABNORMAL HIGH (ref 150–400)
Platelets: 442 10*3/uL — ABNORMAL HIGH (ref 150–400)
Platelets: 412 10*3/uL — ABNORMAL HIGH (ref 150–400)
RBC: 4.02 MIL/uL — ABNORMAL LOW (ref 4.22–5.81)
RBC: 3.82 MIL/uL — ABNORMAL LOW (ref 4.22–5.81)
RBC: 3.49 MIL/uL — ABNORMAL LOW (ref 4.22–5.81)
RDW: 14.4 % (ref 11.5–15.5)
RDW: 14.6 % (ref 11.5–15.5)
RDW: 14.6 % (ref 11.5–15.5)
WBC: 44.3 10*3/uL — ABNORMAL HIGH (ref 4.0–10.5)
WBC: 42.7 10*3/uL — ABNORMAL HIGH (ref 4.0–10.5)
WBC: 31.3 10*3/uL — ABNORMAL HIGH (ref 4.0–10.5)
nRBC: 0 % (ref 0.0–0.2)
nRBC: 0 % (ref 0.0–0.2)
nRBC: 0 % (ref 0.0–0.2)

## 2021-03-17 LAB — CBC WITH DIFFERENTIAL/PLATELET
Abs Immature Granulocytes: 0.57 10*3/uL — ABNORMAL HIGH (ref 0.00–0.07)
Basophils Absolute: 0.1 10*3/uL (ref 0.0–0.1)
Basophils Relative: 0 %
Eosinophils Absolute: 0 10*3/uL (ref 0.0–0.5)
Eosinophils Relative: 0 %
HCT: 39.7 % (ref 39.0–52.0)
Hemoglobin: 13.6 g/dL (ref 13.0–17.0)
Immature Granulocytes: 1 %
Lymphocytes Relative: 2 %
Lymphs Abs: 0.7 10*3/uL (ref 0.7–4.0)
MCH: 36 pg — ABNORMAL HIGH (ref 26.0–34.0)
MCHC: 34.3 g/dL (ref 30.0–36.0)
MCV: 105 fL — ABNORMAL HIGH (ref 80.0–100.0)
Monocytes Absolute: 1.8 10*3/uL — ABNORMAL HIGH (ref 0.1–1.0)
Monocytes Relative: 4 %
Neutro Abs: 40.8 10*3/uL — ABNORMAL HIGH (ref 1.7–7.7)
Neutrophils Relative %: 93 %
Platelets: 455 10*3/uL — ABNORMAL HIGH (ref 150–400)
RBC: 3.78 MIL/uL — ABNORMAL LOW (ref 4.22–5.81)
RDW: 14.5 % (ref 11.5–15.5)
WBC: 44 10*3/uL — ABNORMAL HIGH (ref 4.0–10.5)
nRBC: 0 % (ref 0.0–0.2)

## 2021-03-17 LAB — HEPATIC FUNCTION PANEL
ALT: 15 U/L (ref 0–44)
AST: 57 U/L — ABNORMAL HIGH (ref 15–41)
Albumin: 2.4 g/dL — ABNORMAL LOW (ref 3.5–5.0)
Alkaline Phosphatase: 106 U/L (ref 38–126)
Bilirubin, Direct: 0.5 mg/dL — ABNORMAL HIGH (ref 0.0–0.2)
Indirect Bilirubin: 0.9 mg/dL (ref 0.3–0.9)
Total Bilirubin: 1.4 mg/dL — ABNORMAL HIGH (ref 0.3–1.2)
Total Protein: 7.1 g/dL (ref 6.5–8.1)

## 2021-03-17 LAB — ECHOCARDIOGRAM LIMITED
AR max vel: 2.51 cm2
AV Area VTI: 2.67 cm2
AV Area mean vel: 2.53 cm2
AV Mean grad: 5 mmHg
AV Peak grad: 7.8 mmHg
Ao pk vel: 1.4 m/s
Height: 65 in
S' Lateral: 3 cm
Weight: 2292.78 oz

## 2021-03-17 LAB — RENAL FUNCTION PANEL
Albumin: 2 g/dL — ABNORMAL LOW (ref 3.5–5.0)
Anion gap: 21 — ABNORMAL HIGH (ref 5–15)
BUN: 73 mg/dL — ABNORMAL HIGH (ref 8–23)
CO2: 23 mmol/L (ref 22–32)
Calcium: 8.5 mg/dL — ABNORMAL LOW (ref 8.9–10.3)
Chloride: 93 mmol/L — ABNORMAL LOW (ref 98–111)
Creatinine, Ser: 10.14 mg/dL — ABNORMAL HIGH (ref 0.61–1.24)
GFR, Estimated: 5 mL/min — ABNORMAL LOW (ref >60)
Glucose, Bld: 130 mg/dL — ABNORMAL HIGH (ref 70–99)
Phosphorus: 6.7 mg/dL — ABNORMAL HIGH (ref 2.5–4.6)
Potassium: 5.6 mmol/L — ABNORMAL HIGH (ref 3.5–5.1)
Sodium: 137 mmol/L (ref 135–145)

## 2021-03-17 LAB — HEPATITIS B SURFACE ANTIBODY,QUALITATIVE: Hep B S Ab: REACTIVE — AB

## 2021-03-17 LAB — LACTIC ACID, PLASMA
Lactic Acid, Venous: 2.1 mmol/L (ref 0.5–1.9)
Lactic Acid, Venous: 1.8 mmol/L (ref 0.5–1.9)
Lactic Acid, Venous: 1.2 mmol/L (ref 0.5–1.9)

## 2021-03-17 LAB — GLUCOSE, CAPILLARY
Glucose-Capillary: 134 mg/dL — ABNORMAL HIGH (ref 70–99)
Glucose-Capillary: 134 mg/dL — ABNORMAL HIGH (ref 70–99)
Glucose-Capillary: 129 mg/dL — ABNORMAL HIGH (ref 70–99)
Glucose-Capillary: 116 mg/dL — ABNORMAL HIGH (ref 70–99)
Glucose-Capillary: 121 mg/dL — ABNORMAL HIGH (ref 70–99)

## 2021-03-17 LAB — OCCULT BLOOD X 1 CARD TO LAB, STOOL: Fecal Occult Bld: POSITIVE — AB

## 2021-03-17 MED ORDER — SODIUM CHLORIDE 0.9 % IV BOLUS
500.0000 mL | Freq: Once | INTRAVENOUS | Status: AC
  Administered 2021-03-17: 500 mL via INTRAVENOUS

## 2021-03-17 MED ORDER — LIDOCAINE-PRILOCAINE 2.5-2.5 % EX CREA: 1.0000 "application " | TOPICAL_CREAM | CUTANEOUS | Status: DC | PRN

## 2021-03-17 MED ORDER — DIGOXIN 0.25 MG/ML IJ SOLN
0.1250 mg | Freq: Every day | INTRAMUSCULAR | Status: DC
  Administered 2021-03-17: 0.125 mg via INTRAVENOUS
  Filled 2021-03-17: qty 0.5

## 2021-03-17 MED ORDER — SODIUM CHLORIDE 0.9 % IV SOLN: 100.0000 mL | INTRAVENOUS | Status: DC | PRN

## 2021-03-17 MED ORDER — PENTAFLUOROPROP-TETRAFLUOROETH EX AERO: 1.0000 "application " | INHALATION_SPRAY | CUTANEOUS | Status: DC | PRN

## 2021-03-17 MED ORDER — AMIODARONE HCL IN DEXTROSE 360-4.14 MG/200ML-% IV SOLN
60.0000 mg/h | INTRAVENOUS | Status: DC
  Administered 2021-03-17 (×2): 60 mg/h via INTRAVENOUS
  Filled 2021-03-17 (×2): qty 200

## 2021-03-17 MED ORDER — LIDOCAINE HCL (PF) 1 % IJ SOLN: 5.0000 mL | INTRAMUSCULAR | Status: DC | PRN

## 2021-03-17 MED ORDER — AMIODARONE LOAD VIA INFUSION
150.0000 mg | Freq: Once | INTRAVENOUS | Status: AC
Start: 2021-03-17 — End: 2021-03-17
  Administered 2021-03-17: 150 mg via INTRAVENOUS
  Filled 2021-03-17: qty 83.34

## 2021-03-17 MED ORDER — LATANOPROST 0.005 % OP SOLN
1.0000 [drp] | Freq: Every day | OPHTHALMIC | Status: DC
  Administered 2021-03-17 – 2021-03-25 (×9): 1 [drp] via OPHTHALMIC
  Filled 2021-03-17: qty 2.5

## 2021-03-17 MED ORDER — AMIODARONE HCL IN DEXTROSE 360-4.14 MG/200ML-% IV SOLN
30.0000 mg/h | INTRAVENOUS | Status: DC
  Administered 2021-03-17 – 2021-03-18 (×3): 30 mg/h via INTRAVENOUS
  Filled 2021-03-17 (×2): qty 200

## 2021-03-17 MED ORDER — SODIUM CHLORIDE 0.9 % IV SOLN: INTRAVENOUS | Status: DC

## 2021-03-17 MED ORDER — IOHEXOL 300 MG/ML  SOLN
80.0000 mL | Freq: Once | INTRAMUSCULAR | Status: AC | PRN
  Administered 2021-03-17: 80 mL via INTRAVENOUS

## 2021-03-17 MED ORDER — HYDROMORPHONE HCL 1 MG/ML IJ SOLN
0.5000 mg | INTRAMUSCULAR | Status: DC | PRN
  Administered 2021-03-17 – 2021-03-18 (×2): 0.5 mg via INTRAVENOUS
  Filled 2021-03-17 (×3): qty 1

## 2021-03-17 MED ORDER — SODIUM CHLORIDE 0.9 % IV BOLUS
1000.0000 mL | Freq: Once | INTRAVENOUS | Status: AC
  Administered 2021-03-17: 1000 mL via INTRAVENOUS

## 2021-03-17 NOTE — Progress Notes (Signed)
Pt receives out-pt HD at [facility] on MWF. Pt arrives at 10:35 for 10:50 chair time. Clinic reports pt is from [facility]. Will assist as needed.  Melven Sartorius Renal Navigator [phone]

## 2021-03-17 NOTE — Progress Notes (Signed)
Reviewed chart. Note patient NPO with severe abdominal pain, acute cholecystitis. Will continue to hold exam and wait for MD guidance on proceeding with pos. Plan to check in am 1/14.   Ward MA, CCC-SLP

## 2021-03-17 NOTE — Progress Notes (Signed)
°  Echocardiogram 2D Echocardiogram has been performed.  Elijah White 03/17/2021, 5:52 PM

## 2021-03-17 NOTE — Progress Notes (Addendum)
Progress Note    Elijah White  VCB:[id number] DOB: Jul 14, 1940  DOA: 03/15/2021 PCP: Maryella Shivers, MD    Brief Narrative:    Medical records reviewed and are as summarized below:  Elijah White is an 81 y.o. male with medical history significant of ESRD on HD MWF, aortic stenosis status post TAVR 2021, nonobstructive CAD, Parkinson's disease, PVD on Plavix, s/p left BKA, HTN, IIDM, HLD,  came with recurrent right upper quadrant abdominal pain.  Thought to have cholecystitis and drain placed by IR.  Stay in hospital complicated by a fib with RVR, continued WBC count elevation and abdominal pain  Assessment/Plan:   Principal Problem:   Acute cholecystitis   Acute RUQ abdominal pain with leucocytosis -Secondary to cholelithiasis, suspect early acute cholecystitis with clinical Murphy sign positive. -IV abx (zosyn) -GS consult IR consult for percutaneous drain  -with continued WBC and pain that seems out of proportion to studies, a repeat CT Scan was done that showed: Grossly stable moderate right hydroureteronephrosis is noted as well as prominent left renal pelvis and mild distal left ureteral dilatation, without evidence of obstructing calculus. Mild urinary bladder distention is noted. -check bladder scan -send U/A (as of June 2022 he still made urine) CT scan suggestive of perhaps a Right sided PNA-- on abx already  ESRD on HD -nephrology consulted.  -plan for HD 1/13  A fib with RVR -dig x 1 -amiodarone started -cards consult much appreciated -old EKG reviewed again by cards and DOES NOT APPEAR to be mobitz type 2- more likely sinus with non-conducted PACs  HTN -holding home meds   Parkinson's disease -continue Sinemet   Diabetic neuropathy -Continue gabapentin  Hyperglycemia -SSI for now   Family Communication/Anticipated D/C date and plan/Code Status   DVT prophylaxis: heparin Code Status: DNR Disposition Plan: Status is: Inpatient Spoke  with daughter/son on phone Remains inpatient appropriate because: needs IV abx         Medical Consultants:   IR GS nephrology  Subjective:   Having severe pain in abdomen and c/o right shoulder pain Episode of vomiting dark sludge  Objective:    Vitals:   03/17/21 1142 03/17/21 1230 03/17/21 1321 03/17/21 1430  BP: (!) 100/58 (!) 94/48 92/68 109/80  Pulse: (!) 118 (!) 130 91 77  Resp: 18 14 20 20   Temp:  97.8 F (36.6 C)    TempSrc:  Oral    SpO2: 95% 100% 100% 99%  Weight:      Height:        Intake/Output Summary (Last 24 hours) at 03/17/2021 1557 Last data filed at 03/17/2021 1136 Gross per 24 hour  Intake 421.8 ml  Output 351 ml  Net 70.8 ml   Filed Weights   03/15/21 0824 03/16/21 2045  Weight: 64.9 kg 65 kg    Exam:   General: Appearance:    Elderly male in pain   Diffusely tender to palpation in abdomen  Lungs:     Clear to auscultation bilaterally, respirations unlabored  Heart:    Normal heart rate.    MS:   Below knee amputation of left lower extremity is noted. Right foot warm and NT  Neurologic:   Tremulous       Data Reviewed:   I have personally reviewed following labs and imaging studies:  Labs: Labs show the following:   Basic Metabolic Panel: Recent Labs  Lab 03/15/21 0855 03/16/21 0545 03/16/21 2200 03/17/21 0328  NA 134* 139 139 137  K 4.8 5.4* 6.1* 6.0*  CL 91* 85* 88* 91*  CO2 23 29 28 27   GLUCOSE 179* 249* 170* 140*  BUN 28* 41* 59* 64*  CREATININE 7.25* 8.56* 9.69* 9.84*  CALCIUM 9.1 9.2 9.2 8.6*  PHOS  --   --  6.6*  --    GFR Estimated Creatinine Clearance: 5 mL/min (A) (by C-G formula based on SCr of 9.84 mg/dL (H)). Liver Function Tests: Recent Labs  Lab 03/15/21 0855 03/16/21 0545 03/16/21 2200 03/17/21 0328  AST 26 48*  --  57*  ALT 10 14  --  15  ALKPHOS 92 98  --  106  BILITOT 1.0 1.0  --  1.4*  PROT 7.4 8.5*  --  7.1  ALBUMIN 3.2* 2.6* 2.6* 2.4*   Recent Labs  Lab 03/15/21 0855   LIPASE 20   No results for input(s): AMMONIA in the last 168 hours. Coagulation profile No results for input(s): INR, PROTIME in the last 168 hours.  CBC: Recent Labs  Lab 03/15/21 0855 03/16/21 0251 03/16/21 2302 03/17/21 0328  WBC 19.1* 40.7* 44.3* 44.0*   42.7*  NEUTROABS 16.9*  --   --  40.8*  HGB 13.8 15.0 14.1 13.6   13.2  HCT 42.1 45.1 41.8 39.7   40.3  MCV 106.3* 104.6* 104.0* 105.0*   105.5*  PLT 414* 343 491* 455*   442*   Cardiac Enzymes: No results for input(s): CKTOTAL, CKMB, CKMBINDEX, TROPONINI in the last 168 hours. BNP (last 3 results) No results for input(s): PROBNP in the last 8760 hours. CBG: Recent Labs  Lab 03/16/21 2230 03/16/21 2350 03/17/21 0405 03/17/21 0747 03/17/21 1149  GLUCAP 168* 172* 134* 134* 129*   D-Dimer: No results for input(s): DDIMER in the last 72 hours. Hgb A1c: No results for input(s): HGBA1C in the last 72 hours. Lipid Profile: No results for input(s): CHOL, HDL, LDLCALC, TRIG, CHOLHDL, LDLDIRECT in the last 72 hours. Thyroid function studies: No results for input(s): TSH, T4TOTAL, T3FREE, THYROIDAB in the last 72 hours.  Invalid input(s): FREET3 Anemia work up: No results for input(s): VITAMINB12, FOLATE, FERRITIN, TIBC, IRON, RETICCTPCT in the last 72 hours. Sepsis Labs: Recent Labs  Lab 03/15/21 0855 03/16/21 0251 03/16/21 2302 03/16/21 2323 03/17/21 0328  WBC 19.1* 40.7* 44.3*  --  44.0*   42.7*  LATICACIDVEN  --   --   --  2.1* 1.8    Microbiology Recent Results (from the past 240 hour(s))  Resp Panel by RT-PCR (Flu A&B, Covid) Nasopharyngeal Swab     Status: None   Collection Time: 03/15/21  1:35 PM   Specimen: Nasopharyngeal Swab; Nasopharyngeal(NP) swabs in vial transport medium  Result Value Ref Range Status   SARS Coronavirus 2 by RT PCR NEGATIVE NEGATIVE Final    Comment: (NOTE) SARS-CoV-2 target nucleic acids are NOT DETECTED.  The SARS-CoV-2 RNA is generally detectable in upper  respiratory specimens during the acute phase of infection. The lowest concentration of SARS-CoV-2 viral copies this assay can detect is 138 copies/mL. A negative result does not preclude SARS-Cov-2 infection and should not be used as the sole basis for treatment or other patient management decisions. A negative result may occur with  improper specimen collection/handling, submission of specimen other than nasopharyngeal swab, presence of viral mutation(s) within the areas targeted by this assay, and inadequate number of viral copies(<138 copies/mL). A negative result must be combined with clinical observations, patient history, and epidemiological information. The expected result is Negative.  Fact  Sheet for Patients:  EntrepreneurPulse.com.au  Fact Sheet for Healthcare Providers:  IncredibleEmployment.be  This test is no t yet approved or cleared by the [place] FDA and  has been authorized for detection and/or diagnosis of SARS-CoV-2 by FDA under an Emergency Use Authorization (EUA). This EUA will remain  in effect (meaning this test can be used) for the duration of the COVID-19 declaration under Section 564(b)(1) of the Act, 21 U.S.C.section 360bbb-3(b)(1), unless the authorization is terminated  or revoked sooner.       Influenza A by PCR NEGATIVE NEGATIVE Final   Influenza B by PCR NEGATIVE NEGATIVE Final    Comment: (NOTE) The Xpert Xpress SARS-CoV-2/FLU/RSV plus assay is intended as an aid in the diagnosis of influenza from Nasopharyngeal swab specimens and should not be used as a sole basis for treatment. Nasal washings and aspirates are unacceptable for Xpert Xpress SARS-CoV-2/FLU/RSV testing.  Fact Sheet for Patients: EntrepreneurPulse.com.au  Fact Sheet for Healthcare Providers: IncredibleEmployment.be  This test is not yet approved or cleared by the [place] FDA and has been  authorized for detection and/or diagnosis of SARS-CoV-2 by FDA under an Emergency Use Authorization (EUA). This EUA will remain in effect (meaning this test can be used) for the duration of the COVID-19 declaration under Section 564(b)(1) of the Act, 21 U.S.C. section 360bbb-3(b)(1), unless the authorization is terminated or revoked.  Performed at [facility], [place] [address], [place], [place] [address]   Body fluid culture w Gram Stain     Status: None (Preliminary result)   Collection Time: 03/16/21  8:16 AM   Specimen: BILE; Body Fluid  Result Value Ref Range Status   Specimen Description BILE  Final   Special Requests BILE  Final   Gram Stain NO WBC SEEN NO ORGANISMS SEEN   Final   Culture   Final    NO GROWTH < 24 HOURS Performed at [facility], [place] [address] [place], [place] [address]    Report Status PENDING  Incomplete    Procedures and diagnostic studies:  CT ABDOMEN PELVIS W CONTRAST  Result Date: 03/17/2021 CLINICAL DATA:  Acute generalized abdominal pain. EXAM: CT ABDOMEN AND PELVIS WITH CONTRAST TECHNIQUE: Multidetector CT imaging of the abdomen and pelvis was performed using the standard protocol following bolus administration of intravenous contrast. RADIATION DOSE REDUCTION: This exam was performed according to the departmental dose-optimization program which includes automated exposure control, adjustment of the mA and/or kV according to patient size and/or use of iterative reconstruction technique. CONTRAST:  60mL OMNIPAQUE IOHEXOL 300 MG/ML  SOLN COMPARISON:  March 15, 2021. FINDINGS: Lower chest: Right lower lobe pneumonia or atelectasis is noted. Hepatobiliary: Interval placement of percutaneous cholecystostomy tube is noted. Continued gallbladder distention and cholelithiasis is noted. No biliary dilatation is noted. The liver is unremarkable. Pancreas: Unremarkable. No pancreatic ductal dilatation or surrounding inflammatory changes. Spleen:  Normal in size without focal abnormality. Adrenals/Urinary Tract: Stable 2.6 cm left adrenal nodule is noted. Stable right adrenal gland enlargement is noted. Mild bilateral renal atrophy is noted. No nephrolithiasis is noted. Moderate right hydroureteronephrosis is noted without obstructing calculus. Stable dilatation of left renal pelvis is noted without calyceal dilatation. Mild left ureteral dilatation is noted. No obstructing calculus is noted. Mild urinary bladder distention is noted. Stomach/Bowel: Stomach is within normal limits. Appendix appears normal. No evidence of bowel wall thickening, distention, or inflammatory changes. Vascular/Lymphatic: Aortic atherosclerosis. No enlarged abdominal or pelvic lymph nodes. Reproductive: Status post prostatic brachytherapy seed  placement. Other: No abdominal wall hernia or abnormality. No abdominopelvic ascites. Musculoskeletal: No acute or significant osseous findings. IMPRESSION: Interval development of right lower lobe opacity is noted concerning for pneumonia or atelectasis. Interval placement of percutaneous cholecystostomy tube in grossly good position. Stable gallbladder distension and cholelithiasis is noted. Grossly stable moderate right hydroureteronephrosis is noted as well as prominent left renal pelvis and mild distal left ureteral dilatation, without evidence of obstructing calculus. Mild urinary bladder distention is noted. 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 Aortic Atherosclerosis (ICD10-I70.0). Electronically Signed   By: Marijo Conception M.D.   On: 03/17/2021 09:41   IR Perc Cholecystostomy  Result Date: 03/16/2021 INDICATION: 81 year old male with right upper quadrant pain and imaging findings concerning for acute calculus cholecystitis. A nuclear medicine HIDA scan was ordered to confirm, however patient's clinical status has deteriorated overnight with significant increase in  leukocytosis, new oxygen requirement and increasing pain. Therefore, we are proceeding directly with percutaneous transhepatic cholecystostomy tube placement. EXAM: CHOLECYSTOSTOMY MEDICATIONS: In patient currently on intravenous Zosyn. No additional antibiotic prophylaxis administered. ANESTHESIA/SEDATION: Moderate (conscious) sedation was employed during this procedure. A total of Versed 0.5 mg and Fentanyl 25 mcg and 0.5 mg Dilaudid was administered intravenously. Moderate Sedation Time: 10 minutes. The patient's level of consciousness and vital signs were monitored continuously by radiology nursing throughout the procedure under my direct supervision. FLUOROSCOPY TIME:  Fluoroscopy Time: 0 minutes 36 seconds (2 mGy). COMPLICATIONS: None immediate. PROCEDURE: Informed written consent was obtained from the patient after a thorough discussion of the procedural risks, benefits and alternatives. All questions were addressed. Maximal Sterile Barrier Technique was utilized including caps, mask, sterile gowns, sterile gloves, sterile drape, hand hygiene and skin antiseptic. A timeout was performed prior to the initiation of the procedure. The right upper quadrant was interrogated with ultrasound. The gallbladder is markedly distended. A suitable skin entry site was selected and marked. The overlying skin was sterilely prepped and draped in the standard fashion using chlorhexidine skin prep. Local anesthesia was attained by infiltration with 1% lidocaine. A small dermatotomy was made. Under real-time ultrasound guidance, a 21 gauge Accustick needle was advanced along a short transhepatic course and into the gallbladder lumen. Images were obtained and stored for the medical record. A 0.018 wire was advanced in the gallbladder lumen. The needle was exchanged for the Accustick transitional dilator which was advanced over the wire and into the gallbladder. Aspiration yields dark black bile. A sample was sent for Gram stain  and culture. A 0.035 wire was coiled in the gallbladder lumen. The skin tract was dilated to 10 [place]. A Cook 10.2 [place] all-purpose drainage catheter was advanced over the wire and formed in the gallbladder. The catheter was connected to gravity bag drainage and secured to the skin with 0 Prolene suture. Overall, the patient tolerated the procedure well. IMPRESSION: Successful placement of 10 French percutaneous transhepatic cholecystostomy tube for the indication of acute calculus cholecystitis. Electronically Signed   By: Jacqulynn Cadet M.D.   On: 03/16/2021 14:07   DG CHEST PORT 1 VIEW  Result Date: 03/17/2021 CLINICAL DATA:  Chest abdominal pain.  Aspiration in airway. EXAM: PORTABLE CHEST 1 VIEW COMPARISON:  AP chest 03/16/2021 FINDINGS: The patient is mildly rightward rotated. Within this limitation, cardiac silhouette and mediastinal contours are grossly unchanged and within normal limits. Calcification is seen within the aortic arch. 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 The lungs appear clear. No pleural effusion or pneumothorax. No acute  skeletal abnormality. IMPRESSION: No acute lung process. Electronically Signed   By: Yvonne Kendall M.D.   On: 03/17/2021 09:21   DG CHEST PORT 1 VIEW  Result Date: 03/16/2021 CLINICAL DATA:  Hypoxia EXAM: PORTABLE CHEST 1 VIEW COMPARISON:  Chest x-ray dated March 15, 2021 FINDINGS: Cardiac and mediastinal contours are unchanged within normal limits. Prior transcatheter aortic valve replacement. Interval resolution of right basilar lung opacity, favor resolved atelectasis. No focal consolidation. No large pleural effusion pneumothorax. IMPRESSION: Interval resolution of right basilar lung opacity, favor resolved atelectasis. Electronically Signed   By: Yetta Glassman M.D.   On: 03/16/2021 07:46    Medications:    (feeding supplement) PROSource Plus  30 mL Oral BID BM   aspirin EC  81 mg Oral q AM   atorvastatin  10 mg Oral q AM   brimonidine  1  drop Right Eye TID   carbidopa-levodopa  1 tablet Oral TID   Chlorhexidine Gluconate Cloth  6 each Topical Q0600   cinacalcet  60 mg Oral Q M,W,F-HD   digoxin  0.125 mg Intravenous Daily   docusate sodium  100 mg Oral BID   doxercalciferol  2 mcg Intravenous Q M,W,F-HD   gabapentin  100 mg Oral Daily   heparin injection (subcutaneous)  5,000 Units Subcutaneous Q8H   insulin aspart  0-6 Units Subcutaneous Q4H   latanoprost  1 drop Right Eye QHS   pantoprazole  40 mg Oral Q1200   pilocarpine  1 drop Right Eye QID   polyethylene glycol  17 g Oral QHS   sevelamer carbonate  1,600 mg Oral TID WC   Continuous Infusions:  sodium chloride     sodium chloride     sodium chloride     amiodarone 60 mg/hr (03/17/21 1509)   Followed by   amiodarone     piperacillin-tazobactam (ZOSYN)  IV 2.25 g (03/17/21 1321)     LOS: 2 days   Geradine Girt  [facility]   How to contact the [facility] Attending or Consulting provider [place] or covering provider during after hours [place], for this patient?  Check the care team in [facility] and look for a) attending/consulting [facility] provider listed and b) the [facility] team listed Log into [email or web address] and use [facility] universal password to access. If you do not have the password, please contact the hospital operator. Locate the [facility] provider you are looking for under [facility] and page to a number that you can be directly reached. If you still have difficulty reaching the provider, please page the [facility] (Director on Call) for the Hospitalists listed on amion for assistance.  03/17/2021, 3:57 PM

## 2021-03-17 NOTE — Progress Notes (Signed)
[facility] Progress Note  Subjective: seen in room, blind, no c/o's. Had perc chole drain placed by IR yesterday.   Vitals:   03/17/21 1142 03/17/21 1230 03/17/21 1321 03/17/21 1430  BP: (!) 100/58 (!) 94/48 92/68 109/80  Pulse: (!) 118 (!) 130 91 77  Resp: 18 14 20 20   Temp:  97.8 F (36.6 C)    TempSrc:  Oral    SpO2: 95% 100% 100% 99%  Weight:      Height:        Exam: Gen alert, no distress No rash, cyanosis or gangrene Sclera anicteric, throat clear  No jvd or bruits Chest clear bilat to bases, no rales/ wheezing Irreg R/R, no RG Abd soft ntnd no mass or ascites +bs GU normal male MS L BKA Ext no LE or UE edema, no wounds or ulcers Neuro is alert, responsive, nf  L AVF +t/b     OP HD: [place] MWF  4h  400/600  66kg 2/2 bath  P2  AVF  Hep none  - no esa/ Fe  - sensipar 60 mg po tiw  - hectorol 3 ug tiw IV   Assessment/ Plan: Acute Cholelithiasis: Per primary. SP perc chole tube by IR 1/12. Has been started on Zosyn. Atrial fib w/ RVR - new, seen by cardiology.  IV amio gtt started.  Borderline hypotension - may be a bit dry Hyperkalemia: K+ 6.0 today  ESRD - on HD MWF. Could not get HD this afternoon because of running IV amio gtt. Possibly will get him on tonight as a separate/ bedside.    BP/volume  - Soft BP's.  Carvedilol 3.125 mg PO BID on OP med list, not ordered here.   Anemia  - HGB 15, No ESA.   Metabolic bone disease -  OP labs at goal 03/13/2021. On Auryxia binders as OP. HGB high. Corrected Ca 10.3. Change to Renvela binders. Lower VDRA dose. Continue sensipar.   Nutrition - Albumin 2.6. Protein supps ordered. Renal diet.  Parkinson's disease-per primary 10. DMT2-per primary 11. H/O TAVR 12. Pt is a DNR     Rob Doctor, hospital 03/17/2021, 3:16 PM   Recent Labs  Lab 03/16/21 2200 03/16/21 2302 03/17/21 0328  K 6.1*  --  6.0*  BUN 59*  --  64*  CREATININE 9.69*  --  9.84*  CALCIUM 9.2  --  8.6*  PHOS 6.6*  --   --   HGB  --   14.1 13.6   13.2   Inpatient medications:  (feeding supplement) PROSource Plus  30 mL Oral BID BM   aspirin EC  81 mg Oral q AM   atorvastatin  10 mg Oral q AM   brimonidine  1 drop Right Eye TID   carbidopa-levodopa  1 tablet Oral TID   Chlorhexidine Gluconate Cloth  6 each Topical Q0600   cinacalcet  60 mg Oral Q M,W,F-HD   digoxin  0.125 mg Intravenous Daily   docusate sodium  100 mg Oral BID   doxercalciferol  2 mcg Intravenous Q M,W,F-HD   gabapentin  100 mg Oral Daily   heparin injection (subcutaneous)  5,000 Units Subcutaneous Q8H   insulin aspart  0-6 Units Subcutaneous Q4H   latanoprost  1 drop Right Eye QHS   pantoprazole  40 mg Oral Q1200   pilocarpine  1 drop Right Eye QID   polyethylene glycol  17 g Oral QHS   sevelamer carbonate  1,600 mg Oral TID WC  sodium chloride     sodium chloride     amiodarone 60 mg/hr (03/17/21 1509)   Followed by   amiodarone     piperacillin-tazobactam (ZOSYN)  IV 2.25 g (03/17/21 1321)   sodium chloride, sodium chloride, acetaminophen, hydrALAZINE, HYDROmorphone (DILAUDID) injection, ipratropium-albuterol, lidocaine (PF), lidocaine-prilocaine, morphine injection, ondansetron **OR** ondansetron (ZOFRAN) IV, pentafluoroprop-tetrafluoroeth

## 2021-03-17 NOTE — Progress Notes (Signed)
Patient ID: Elijah White, male   DOB: December 24, 1940, 81 y.o.   MRN: [id number] [facility] Progress Note     Subjective: CC-  Having severe abdominal pain, not relieved by perc chole drain.  Had some dark sludge like emesis this am per nurse.  Patient moaning.  Objective: Vital signs in last 24 hours: Temp:  [97.4 F (36.3 C)-98.2 F (36.8 C)] 97.9 F (36.6 C) (01/13 0739) Pulse Rate:  [61-111] 104 (01/13 0739) Resp:  [10-20] 20 (01/13 0739) BP: (90-145)/(53-87) 94/63 (01/13 0739) SpO2:  [94 %-100 %] 100 % (01/13 0739) Weight:  [65 kg] 65 kg (01/12 2045) Last BM Date: 03/16/21  Intake/Output from previous day: 01/12 0701 - 01/13 0700 In: 471.8 [P.O.:60; IV Piggyback:411.8] Out: 251 [Urine:100; Drains:150; Stool:1] Intake/Output this shift: Total I/O In: -  Out: 350 [Drains:350]  PE: Gen:  obvious distress secondary to pain Card:  regular but tachy in 110s Pulm:  overall CTAB Abd: soft, but very diffusely tender throughout his abdomen with voluntary guarding and some peritoneal signs. Hypoactive BS.  Perc chole drain with 500cc since placement of bilious bloody output. Umbilical hernia soft without overlying skin changes. Psych: unable to really answer many questions secondary to pain, difficult to tell if he is altered   Lab Results:  Recent Labs    03/16/21 2302 03/17/21 0328  WBC 44.3* 42.7*  HGB 14.1 13.2  HCT 41.8 40.3  PLT 491* 442*   BMET Recent Labs    03/16/21 2200 03/17/21 0328  NA 139 137  K 6.1* 6.0*  CL 88* 91*  CO2 28 27  GLUCOSE 170* 140*  BUN 59* 64*  CREATININE 9.69* 9.84*  CALCIUM 9.2 8.6*   PT/INR No results for input(s): LABPROT, INR in the last 72 hours. CMP     Component Value Date/Time   NA 137 03/17/2021 0328   NA 134 (A) 09/11/2019 0000   K 6.0 (H) 03/17/2021 0328   CL 91 (L) 03/17/2021 0328   CO2 27 03/17/2021 0328   GLUCOSE 140 (H) 03/17/2021 0328   BUN 64 (H) 03/17/2021 0328   BUN 34 (A) 09/11/2019 0000    CREATININE 9.84 (H) 03/17/2021 0328   CALCIUM 8.6 (L) 03/17/2021 0328   PROT 8.5 (H) 03/16/2021 0545   PROT 6.9 02/19/2018 1014   ALBUMIN 2.6 (L) 03/16/2021 2200   ALBUMIN 4.2 02/19/2018 1014   AST 48 (H) 03/16/2021 0545   ALT 14 03/16/2021 0545   ALKPHOS 98 03/16/2021 0545   BILITOT 1.0 03/16/2021 0545   BILITOT 0.5 02/19/2018 1014   GFRNONAA 5 (L) 03/17/2021 0328   GFRAA 7 (L) 12/03/2019 0710   Lipase     Component Value Date/Time   LIPASE 20 03/15/2021 0855       Studies/Results: DG Chest 1 View  Result Date: 03/15/2021 CLINICAL DATA:  Abdominal pain and vomiting. Clinical concern for aspiration. EXAM: CHEST  1 VIEW COMPARISON:  06/24/2020 FINDINGS: Normal sized heart. Interval minimal patchy density at the right lung base. The left lung remains clear. Aortic valve stent. Thoracic spine degenerative changes. Left axillary surgical clip. IMPRESSION: Minimal right basilar atelectasis, pneumonia or aspiration pneumonitis. Electronically Signed   By: Claudie Revering M.D.   On: 03/15/2021 14:07   US Abdomen Limited  Result Date: 03/15/2021 CLINICAL DATA:  Right upper quadrant pain EXAM: ULTRASOUND ABDOMEN LIMITED RIGHT UPPER QUADRANT COMPARISON:  None. FINDINGS: Gallbladder: Distended with sludge and calculi measuring up to 4 mm. No wall thickening visualized. No  pericholecystic fluid. No sonographic Murphy sign noted by sonographer. Common bile duct: Diameter: 3 mm, normal Liver: Left lobe of liver is poorly visualized due to bowel gas. No focal lesion identified. Within normal limits in parenchymal echogenicity. Portal vein is patent on color Doppler imaging with normal direction of blood flow towards the liver. Other: None. IMPRESSION: Distended gallbladder with sludge and calculi but no additional sonographic evidence of acute cholecystitis. Electronically Signed   By: Macy Mis M.D.   On: 03/15/2021 11:40   IR Perc Cholecystostomy  Result Date: 03/16/2021 INDICATION:  81 year old male with right upper quadrant pain and imaging findings concerning for acute calculus cholecystitis. A nuclear medicine HIDA scan was ordered to confirm, however patient's clinical status has deteriorated overnight with significant increase in leukocytosis, new oxygen requirement and increasing pain. Therefore, we are proceeding directly with percutaneous transhepatic cholecystostomy tube placement. EXAM: CHOLECYSTOSTOMY MEDICATIONS: In patient currently on intravenous Zosyn. No additional antibiotic prophylaxis administered. ANESTHESIA/SEDATION: Moderate (conscious) sedation was employed during this procedure. A total of Versed 0.5 mg and Fentanyl 25 mcg and 0.5 mg Dilaudid was administered intravenously. Moderate Sedation Time: 10 minutes. The patient's level of consciousness and vital signs were monitored continuously by radiology nursing throughout the procedure under my direct supervision. FLUOROSCOPY TIME:  Fluoroscopy Time: 0 minutes 36 seconds (2 mGy). COMPLICATIONS: None immediate. PROCEDURE: Informed written consent was obtained from the patient after a thorough discussion of the procedural risks, benefits and alternatives. All questions were addressed. Maximal Sterile Barrier Technique was utilized including caps, mask, sterile gowns, sterile gloves, sterile drape, hand hygiene and skin antiseptic. A timeout was performed prior to the initiation of the procedure. The right upper quadrant was interrogated with ultrasound. The gallbladder is markedly distended. A suitable skin entry site was selected and marked. The overlying skin was sterilely prepped and draped in the standard fashion using chlorhexidine skin prep. Local anesthesia was attained by infiltration with 1% lidocaine. A small dermatotomy was made. Under real-time ultrasound guidance, a 21 gauge Accustick needle was advanced along a short transhepatic course and into the gallbladder lumen. Images were obtained and stored for the  medical record. A 0.018 wire was advanced in the gallbladder lumen. The needle was exchanged for the Accustick transitional dilator which was advanced over the wire and into the gallbladder. Aspiration yields dark black bile. A sample was sent for Gram stain and culture. A 0.035 wire was coiled in the gallbladder lumen. The skin tract was dilated to 10 [place]. A Cook 10.2 [place] all-purpose drainage catheter was advanced over the wire and formed in the gallbladder. The catheter was connected to gravity bag drainage and secured to the skin with 0 Prolene suture. Overall, the patient tolerated the procedure well. IMPRESSION: Successful placement of 10 French percutaneous transhepatic cholecystostomy tube for the indication of acute calculus cholecystitis. Electronically Signed   By: Jacqulynn Cadet M.D.   On: 03/16/2021 14:07   DG CHEST PORT 1 VIEW  Result Date: 03/16/2021 CLINICAL DATA:  Hypoxia EXAM: PORTABLE CHEST 1 VIEW COMPARISON:  Chest x-ray dated March 15, 2021 FINDINGS: Cardiac and mediastinal contours are unchanged within normal limits. Prior transcatheter aortic valve replacement. Interval resolution of right basilar lung opacity, favor resolved atelectasis. No focal consolidation. No large pleural effusion pneumothorax. IMPRESSION: Interval resolution of right basilar lung opacity, favor resolved atelectasis. Electronically Signed   By: Yetta Glassman M.D.   On: 03/16/2021 07:46   CT Renal Stone Study  Result Date: 03/15/2021 CLINICAL DATA:  Right abdominal  pain. EXAM: CT ABDOMEN AND PELVIS WITHOUT CONTRAST TECHNIQUE: Multidetector CT imaging of the abdomen and pelvis was performed following the standard protocol without IV contrast. RADIATION DOSE REDUCTION: This exam was performed according to the departmental dose-optimization program which includes automated exposure control, adjustment of the mA and/or kV according to patient size and/or use of iterative reconstruction technique.  COMPARISON:  CTA abdomen and pelvis 12/01/2019 FINDINGS: Lower chest: Trace right pleural fluid is new from prior. Unchanged mild bibasilar bronchiectasis. Mild dependent right lower lobe ground-glass likely subsegmental atelectasis. A partially visualized ascending aortic endograft. Dense coronary artery calcifications are seen. No pericardial effusion. Lack of intra-articular fluid limits evaluation of the abdominal and pelvic organ parenchyma. The following findings are made within this limitation. Hepatobiliary: Smooth liver contours. No gross liver lesion is seen. The gallbladder is again moderately distended with numerous layering small gallstones, similar to prior. No definite gallbladder wall thickening. Pancreas: Unremarkable. No pancreatic ductal dilatation or surrounding inflammatory changes. Spleen: Normal in size without focal abnormality. Adrenals/Urinary Tract: Left adrenal 2.6 cm nodule is unchanged from 11/26/2019 with internal density of 15 Hounsfield units. Right adrenal 15 mm nodule is unchanged in size with internal density of 5 Hounsfield units suggesting a benign lipid rich adenoma. These both again are likely benign. Mild left-greater-than-right renal pelvis vascular calcifications. Mild bilateral renal cortical thinning is again seen. Bilateral moderate extrarenal pelves are again noted. There is again mild-to-moderate dilatation of the bilateral ureters diffusely down to the level of the ureteropelvic junction likely chronic. No obstructing renal stone is seen. No hydronephrosis. Within the limitations of lack of IV contrast, no contour deforming renal mass. The urinary bladder is grossly unremarkable. Stomach/Bowel: No bowel wall thickening. The terminal ileum is unremarkable. The appendix is within normal limits. No dilated loops of bowel to indicate bowel obstruction. Vascular/Lymphatic: No abdominal aortic aneurysm. Dense vascular calcifications. No enlarged abdominal or pelvic lymph  nodes. 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 Other: Small fat containing umbilical hernia, unchanged. Unchanged small fat containing left inguinal hernia. No free air or free fluid. Musculoskeletal: Mild multilevel degenerative disc changes. Old healed lateral right eleventh rib fracture unchanged. IMPRESSION: 1. No nephroureterolithiasis or hydronephrosis. 2. Cholelithiasis. 3. 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 4. Small fat containing umbilical and left inguinal hernias. 5. Trace right pleural effusion with mild subsegmental atelectasis. 6.  Aortic Atherosclerosis (ICD10-I70.0). Electronically Signed   By: Yvonne Kendall   On: 03/15/2021 11:14    Anti-infectives: Anti-infectives (From admission, onward)    Start     Dose/Rate Route Frequency Ordered Stop   03/15/21 1400  piperacillin-tazobactam (ZOSYN) IVPB 2.25 g        2.25 g 100 mL/hr over 30 Minutes Intravenous Every 8 hours 03/15/21 1326     03/15/21 1330  Ampicillin-Sulbactam (UNASYN) 3 g in sodium chloride 0.9 % 100 mL IVPB  Status:  Discontinued        3 g 200 mL/hr over 30 Minutes Intravenous Every evening 03/15/21 1324 03/15/21 1326        Assessment/Plan Acute cholecystitis, diffuse abdominal pain - s/p perc chole drain, but this has not helped his pain at all -WBC still over 40K., some tachy, and some hypotension (in 90s) -lactic acid was normal this am  -K up, but likely secondary to missing HD yesterday, but can't rule out possibly ischemia based on exam -PCXR this am overall looks good and would not explain WBC.  No air under his  diaphragm though noted. -STAT CT A/P with IV contrast as his exam and WBC are very concerning.   -keep NPO due to risk of aspiration and abdominal symptoms. -cont zosyn -discussed with Dr. Eliseo Squires and formed plan together -vitals, labs, imaging, I/0s reviewed over last 24 hrs.  ID - Zosyn VTE - SCDs, hold  chemical prophylaxis for possible procedure.  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    High Medical Decision Making    LOS: 2 days    Henreitta Cea, [facility] Surgery 03/17/2021, 8:14 AM Please see Amion for pager number during day hours 7:00am-4:30pm

## 2021-03-17 NOTE — Progress Notes (Signed)
Referring Physician(s): Dr. Jacqulynn Cadet  Supervising Physician: Ruthann Cancer  Patient Status:  [facility] - In-pt  Chief Complaint:  S/p placement of 10 French percutaneous transhepatic cholecystostomy tube for the indication of acute calculus cholecystitis.  Subjective: Pt is confused, yells out at times and restless. Pt's niece at bedside.  Allergies: Patient has no known allergies.  Medications: Prior to Admission medications   Medication Sig Start Date End Date Taking? Authorizing Provider  acetaminophen (TYLENOL) 500 MG tablet Take 1,000 mg by mouth every 6 (six) hours as needed for moderate pain or headache.   Yes [provider]  aspirin EC 81 MG tablet Take 81 mg by mouth in the morning. Swallow whole.   Yes [provider]  atorvastatin (LIPITOR) 10 MG tablet Take 1 tablet (10 mg total) by mouth in the morning. 08/16/20  Yes Rhyne, Samantha J, PA-C  AURYXIA 1 GM 210 MG(Fe) tablet Take 420 mg by mouth 3 (three) times daily with meals. 05/30/20  Yes [provider]  B Complex-C-Folic Acid (DIALYVITE 546) 0.8 MG TABS Take 1 tablet by mouth in the morning. 05/08/19  Yes [provider]  brimonidine (ALPHAGAN) 0.15 % ophthalmic solution Place 1 drop into the right eye 3 (three) times daily.   Yes [provider]  carbidopa-levodopa (SINEMET) 25-100 MG tablet Take 1 tablet by mouth 3 (three) times daily. Patient taking differently: Take 1 tablet by mouth 3 (three) times daily. 1 TABLET @ 8am/ 12pm/ 4pm 10/19/20 03/15/21 Yes Curatolo, Adam, DO  clopidogrel (PLAVIX) 75 MG tablet Take 1 tablet (75 mg total) by mouth daily with breakfast. 07/25/20  Yes Marty Heck, MD  docusate sodium (COLACE) 100 MG capsule Take 100 mg by mouth 2 (two) times daily.   Yes [provider]  dorzolamide-timolol (COSOPT) 22.3-6.8 MG/ML ophthalmic solution Place 1 drop into the right eye 2 (two) times daily. 06/25/17  Yes [provider]   gabapentin (NEURONTIN) 100 MG capsule Take 1 capsule (100 mg total) by mouth daily. 10/19/20 03/15/21 Yes Curatolo, Adam, DO  isosorbide mononitrate (IMDUR) 30 MG 24 hr tablet Take 1 tablet (30 mg total) by mouth in the morning. 08/16/20  Yes Rhyne, Hulen Shouts, PA-C  Melatonin 5 MG CAPS Take 5 mg by mouth at bedtime.   Yes [provider]  pantoprazole (PROTONIX) 40 MG tablet Take 1 tablet (40 mg total) by mouth daily at 12 noon. 08/16/20  Yes Rhyne, Hulen Shouts, PA-C  pilocarpine (PILOCAR) 4 % ophthalmic solution Place 1 drop into the right eye 4 (four) times daily.    Yes [provider]  polyethylene glycol (MIRALAX / GLYCOLAX) 17 g packet Take 17 g by mouth at bedtime.   Yes [provider]  ROCKLATAN 0.02-0.005 % SOLN Place 1 drop into the right eye at bedtime. 06/20/20  Yes [provider]  blood glucose meter kit and supplies KIT Dispense based on patient and insurance preference. Use up to four times daily as directed. (FOR ICD-9 250.00, 250.01). For QAC - HS accuchecks. 03/30/18   Thurnell Lose, MD  carvedilol (COREG) 3.125 MG tablet Take 1 tablet (3.125 mg total) by mouth 2 (two) times daily with a meal. Patient not taking: Reported on 03/15/2021 08/16/20   Gabriel Earing, PA-C  Doxercalciferol (HECTOROL IV) Doxercalciferol (Hectorol) 08/09/20 08/28/21  [provider]  Lancets (ONETOUCH DELICA PLUS [id number]) [place]  03/31/18   [provider]  Methoxy PEG-Epoetin Beta (MIRCERA IJ) Mircera 09/05/20 09/04/21  [provider]  [facility] VERIO test strip USE AS DIRECTED TO TEST FOUR TIMES A DAY 07/31/18   [name], MD  oxyCODONE-acetaminophen (PERCOCET) 5-325 MG tablet Take 1 tablet by mouth every 6 (six) hours as needed (pain). Patient not taking: Reported on 03/15/2021 08/19/20   Karoline Caldwell, PA-C     Vital Signs: BP (!) 100/58 (BP Location: Right Arm)    Pulse (!) 118    Temp 97.7 F (36.5 C) (Oral)    Resp 18    Ht $R'5\' 5"'Kp$   (1.651 m)    Wt 143 lb 4.8 oz (65 kg)    SpO2 95%    BMI 23.85 kg/m   Physical Exam Vitals reviewed.  Constitutional:      Appearance: He is ill-appearing.  HENT:     Head: Normocephalic and atraumatic.  Cardiovascular:     Rate and Rhythm: Tachycardia present. Rhythm irregular.  Pulmonary:     Effort: No respiratory distress.  Abdominal:     Comments: Drain to RUQ unremarkable with sutures/statlock in place. No redness, bleeding or drainage noted. Dressing C/D/I. Scant amy dark, bloody OP in gravity bag. Drain flushes/aspirates easily.   Skin:    General: Skin is warm and dry.  Neurological:     Mental Status: He is disoriented.    Imaging: DG Chest 1 View  Result Date: 03/15/2021 CLINICAL DATA:  Abdominal pain and vomiting. Clinical concern for aspiration. EXAM: CHEST  1 VIEW COMPARISON:  06/24/2020 FINDINGS: Normal sized heart. Interval minimal patchy density at the right lung base. The left lung remains clear. Aortic valve stent. Thoracic spine degenerative changes. Left axillary surgical clip. IMPRESSION: Minimal right basilar atelectasis, pneumonia or aspiration pneumonitis. Electronically Signed   By: Claudie Revering M.D.   On: 03/15/2021 14:07   CT ABDOMEN PELVIS W CONTRAST  Result Date: 03/17/2021 CLINICAL DATA:  Acute generalized abdominal pain. EXAM: CT ABDOMEN AND PELVIS WITH CONTRAST TECHNIQUE: Multidetector CT imaging of the abdomen and pelvis was performed using the standard protocol following bolus administration of intravenous contrast. RADIATION DOSE REDUCTION: This exam was performed according to the departmental dose-optimization program which includes automated exposure control, adjustment of the mA and/or kV according to patient size and/or use of iterative reconstruction technique. CONTRAST:  55mL OMNIPAQUE IOHEXOL 300 MG/ML  SOLN COMPARISON:  March 15, 2021. FINDINGS: Lower chest: Right lower lobe pneumonia or atelectasis is noted. Hepatobiliary: Interval placement  of percutaneous cholecystostomy tube is noted. Continued gallbladder distention and cholelithiasis is noted. No biliary dilatation is noted. The liver is unremarkable. Pancreas: Unremarkable. No pancreatic ductal dilatation or surrounding inflammatory changes. Spleen: Normal in size without focal abnormality. Adrenals/Urinary Tract: Stable 2.6 cm left adrenal nodule is noted. Stable right adrenal gland enlargement is noted. Mild bilateral renal atrophy is noted. No nephrolithiasis is noted. Moderate right hydroureteronephrosis is noted without obstructing calculus. Stable dilatation of left renal pelvis is noted without calyceal dilatation. Mild left ureteral dilatation is noted. No obstructing calculus is noted. Mild urinary bladder distention is noted. Stomach/Bowel: Stomach is within normal limits. Appendix appears normal. No evidence of bowel wall thickening, distention, or inflammatory changes. Vascular/Lymphatic: Aortic atherosclerosis. No enlarged abdominal or pelvic lymph nodes. Reproductive: Status post prostatic brachytherapy seed placement. Other: No abdominal wall hernia or abnormality. No abdominopelvic ascites. Musculoskeletal: No acute or significant osseous findings. IMPRESSION: Interval development of right lower lobe opacity is noted concerning for pneumonia or atelectasis. Interval placement of percutaneous cholecystostomy tube in grossly good position. Stable gallbladder distension and cholelithiasis  is noted. Grossly stable moderate right hydroureteronephrosis is noted as well as prominent left renal pelvis and mild distal left ureteral dilatation, without evidence of obstructing calculus. Mild urinary bladder distention is noted. 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 Aortic Atherosclerosis (ICD10-I70.0). Electronically Signed   By: Marijo Conception M.D.   On: 03/17/2021 09:41   US Abdomen Limited  Result Date: 03/15/2021 CLINICAL  DATA:  Right upper quadrant pain EXAM: ULTRASOUND ABDOMEN LIMITED RIGHT UPPER QUADRANT COMPARISON:  None. FINDINGS: Gallbladder: Distended with sludge and calculi measuring up to 4 mm. No wall thickening visualized. No pericholecystic fluid. No sonographic Murphy sign noted by sonographer. Common bile duct: Diameter: 3 mm, normal Liver: Left lobe of liver is poorly visualized due to bowel gas. No focal lesion identified. Within normal limits in parenchymal echogenicity. Portal vein is patent on color Doppler imaging with normal direction of blood flow towards the liver. Other: None. IMPRESSION: Distended gallbladder with sludge and calculi but no additional sonographic evidence of acute cholecystitis. Electronically Signed   By: Macy Mis M.D.   On: 03/15/2021 11:40   IR Perc Cholecystostomy  Result Date: 03/16/2021 INDICATION: 81 year old male with right upper quadrant pain and imaging findings concerning for acute calculus cholecystitis. A nuclear medicine HIDA scan was ordered to confirm, however patient's clinical status has deteriorated overnight with significant increase in leukocytosis, new oxygen requirement and increasing pain. Therefore, we are proceeding directly with percutaneous transhepatic cholecystostomy tube placement. EXAM: CHOLECYSTOSTOMY MEDICATIONS: In patient currently on intravenous Zosyn. No additional antibiotic prophylaxis administered. ANESTHESIA/SEDATION: Moderate (conscious) sedation was employed during this procedure. A total of Versed 0.5 mg and Fentanyl 25 mcg and 0.5 mg Dilaudid was administered intravenously. Moderate Sedation Time: 10 minutes. The patient's level of consciousness and vital signs were monitored continuously by radiology nursing throughout the procedure under my direct supervision. FLUOROSCOPY TIME:  Fluoroscopy Time: 0 minutes 36 seconds (2 mGy). COMPLICATIONS: None immediate. PROCEDURE: Informed written consent was obtained from the patient after a  thorough discussion of the procedural risks, benefits and alternatives. All questions were addressed. Maximal Sterile Barrier Technique was utilized including caps, mask, sterile gowns, sterile gloves, sterile drape, hand hygiene and skin antiseptic. A timeout was performed prior to the initiation of the procedure. The right upper quadrant was interrogated with ultrasound. The gallbladder is markedly distended. A suitable skin entry site was selected and marked. The overlying skin was sterilely prepped and draped in the standard fashion using chlorhexidine skin prep. Local anesthesia was attained by infiltration with 1% lidocaine. A small dermatotomy was made. Under real-time ultrasound guidance, a 21 gauge Accustick needle was advanced along a short transhepatic course and into the gallbladder lumen. Images were obtained and stored for the medical record. A 0.018 wire was advanced in the gallbladder lumen. The needle was exchanged for the Accustick transitional dilator which was advanced over the wire and into the gallbladder. Aspiration yields dark black bile. A sample was sent for Gram stain and culture. A 0.035 wire was coiled in the gallbladder lumen. The skin tract was dilated to 10 [place]. A Cook 10.2 [place] all-purpose drainage catheter was advanced over the wire and formed in the gallbladder. The catheter was connected to gravity bag drainage and secured to the skin with 0 Prolene suture. Overall, the patient tolerated the procedure well. IMPRESSION: Successful placement of 10 French percutaneous transhepatic cholecystostomy tube for the indication of acute calculus cholecystitis. Electronically Signed   By: Dellis Filbert.D.  On: 03/16/2021 14:07   DG CHEST PORT 1 VIEW  Result Date: 03/17/2021 CLINICAL DATA:  Chest abdominal pain.  Aspiration in airway. EXAM: PORTABLE CHEST 1 VIEW COMPARISON:  AP chest 03/16/2021 FINDINGS: The patient is mildly rightward rotated. Within this limitation, cardiac  silhouette and mediastinal contours are grossly unchanged and within normal limits. Calcification is seen within the aortic arch. 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 The lungs appear clear. No pleural effusion or pneumothorax. No acute skeletal abnormality. IMPRESSION: No acute lung process. Electronically Signed   By: Yvonne Kendall M.D.   On: 03/17/2021 09:21   DG CHEST PORT 1 VIEW  Result Date: 03/16/2021 CLINICAL DATA:  Hypoxia EXAM: PORTABLE CHEST 1 VIEW COMPARISON:  Chest x-ray dated March 15, 2021 FINDINGS: Cardiac and mediastinal contours are unchanged within normal limits. Prior transcatheter aortic valve replacement. Interval resolution of right basilar lung opacity, favor resolved atelectasis. No focal consolidation. No large pleural effusion pneumothorax. IMPRESSION: Interval resolution of right basilar lung opacity, favor resolved atelectasis. Electronically Signed   By: Yetta Glassman M.D.   On: 03/16/2021 07:46   CT Renal Stone Study  Result Date: 03/15/2021 CLINICAL DATA:  Right abdominal pain. EXAM: CT ABDOMEN AND PELVIS WITHOUT CONTRAST TECHNIQUE: Multidetector CT imaging of the abdomen and pelvis was performed following the standard protocol without IV contrast. RADIATION DOSE REDUCTION: This exam was performed according to the departmental dose-optimization program which includes automated exposure control, adjustment of the mA and/or kV according to patient size and/or use of iterative reconstruction technique. COMPARISON:  CTA abdomen and pelvis 12/01/2019 FINDINGS: Lower chest: Trace right pleural fluid is new from prior. Unchanged mild bibasilar bronchiectasis. Mild dependent right lower lobe ground-glass likely subsegmental atelectasis. A partially visualized ascending aortic endograft. Dense coronary artery calcifications are seen. No pericardial effusion. Lack of intra-articular fluid limits evaluation of the abdominal and pelvic organ parenchyma. The following findings are  made within this limitation. Hepatobiliary: Smooth liver contours. No gross liver lesion is seen. The gallbladder is again moderately distended with numerous layering small gallstones, similar to prior. No definite gallbladder wall thickening. Pancreas: Unremarkable. No pancreatic ductal dilatation or surrounding inflammatory changes. Spleen: Normal in size without focal abnormality. Adrenals/Urinary Tract: Left adrenal 2.6 cm nodule is unchanged from 11/26/2019 with internal density of 15 Hounsfield units. Right adrenal 15 mm nodule is unchanged in size with internal density of 5 Hounsfield units suggesting a benign lipid rich adenoma. These both again are likely benign. Mild left-greater-than-right renal pelvis vascular calcifications. Mild bilateral renal cortical thinning is again seen. Bilateral moderate extrarenal pelves are again noted. There is again mild-to-moderate dilatation of the bilateral ureters diffusely down to the level of the ureteropelvic junction likely chronic. No obstructing renal stone is seen. No hydronephrosis. Within the limitations of lack of IV contrast, no contour deforming renal mass. The urinary bladder is grossly unremarkable. Stomach/Bowel: No bowel wall thickening. The terminal ileum is unremarkable. The appendix is within normal limits. No dilated loops of bowel to indicate bowel obstruction. Vascular/Lymphatic: No abdominal aortic aneurysm. Dense vascular calcifications. No enlarged abdominal or pelvic lymph nodes. 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 Other: Small fat containing umbilical hernia, unchanged. Unchanged small fat containing left inguinal hernia. No free air or free fluid. Musculoskeletal: Mild multilevel degenerative disc changes. Old healed lateral right eleventh rib fracture unchanged. IMPRESSION: 1. No nephroureterolithiasis or hydronephrosis. 2. Cholelithiasis. 3. 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 4. Small fat containing umbilical and left  inguinal hernias. 5. Trace right pleural effusion with mild subsegmental atelectasis. 6.  Aortic Atherosclerosis (ICD10-I70.0). Electronically Signed   By: Yvonne Kendall   On: 03/15/2021 11:14    Labs:  CBC: Recent Labs    03/15/21 0855 03/16/21 0251 03/16/21 2302 03/17/21 0328  WBC 19.1* 40.7* 44.3* 42.7*  HGB 13.8 15.0 14.1 13.2  HCT 42.1 45.1 41.8 40.3  PLT 414* 343 491* 442*    COAGS: No results for input(s): INR, APTT in the last 8760 hours.  BMP: Recent Labs    03/15/21 0855 03/16/21 0545 03/16/21 2200 03/17/21 0328  NA 134* 139 139 137  K 4.8 5.4* 6.1* 6.0*  CL 91* 85* 88* 91*  CO2 $Re'23 29 28 27  'vQw$ GLUCOSE 179* 249* 170* 140*  BUN 28* 41* 59* 64*  CALCIUM 9.1 9.2 9.2 8.6*  CREATININE 7.25* 8.56* 9.69* 9.84*  GFRNONAA 7* 6* 5* 5*    LIVER FUNCTION TESTS: Recent Labs    09/26/20 2102 10/19/20 1356 03/15/21 0855 03/16/21 0545 03/16/21 2200  BILITOT 0.9 0.6 1.0 1.0  --   AST $Re'16 24 26 'khH$ 48*  --   ALT $Re'9 16 10 14  'FbN$ --   ALKPHOS 81 73 92 98  --   PROT 6.2* 7.1 7.4 8.5*  --   ALBUMIN 3.3* 3.8 3.2* 2.6* 2.6*    Assessment and Plan: -Drain intact with sutures/statlock in place. No redness, bleeding or drainage noted. Dressing C/D/I. Scant amt dark, bloody OP in gravity bag with 350 cc documented in Epic last 24 hours. Drain flushes/aspirates easily. -WBC down to 42.7 from 44.3 yesterday.  -Pt continues to have AMS -HR 170-200, RN at bedside working to reduce HR  -Continue to flush drain TID -Change dressing q shift or PRN -Cal IR with questions or concerns   Narda Rutherford, AGNP-BC 03/17/2021, 12:57 PM     I spent a total of 20 minutes at the the patient's bedside AND on the patient's hospital floor or unit, greater than 50% of which was counseling/coordinating care for S/p placement of 10 French percutaneous transhepatic cholecystostomy tube for the indication of acute  calculus cholecystitis.

## 2021-03-17 NOTE — Progress Notes (Signed)
Lab called with a critical on this pt. Lactic Acid of 2.1. Dr. Marlyce Huge paged and secure chatted.

## 2021-03-17 NOTE — Progress Notes (Signed)
This RN at bedside for PIV placement per [facility] consult. 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 Right forearm assessed with ultrasound, no veins visualized other than vein with USG PIV already present in. Right upper arm assess with ultrasound, brachial vein visualized, and small cephalic vein visualized. Brachial vein preserved, PIV placed in cephalic.  Discussed with primary RN Ronalee Belts about not running Amio through the new upper arm PIV and that patient has limited vasculature for further PIV placement. At this time, should new access be indicated, recommended central line placement.

## 2021-03-17 NOTE — Progress Notes (Signed)
SLP Cancellation Note  Patient Details Name: Elijah White MRN: [id number] DOB: [birth year]   Cancelled treatment:       Reason Eval/Treat Not Completed: Other (comment) (NPO for CT of the abdomen. Will f/u later this am)  Gabriel Rainwater MA, CCC-SLP  Elijah White 03/17/2021, 8:21 AM

## 2021-03-17 NOTE — Consult Note (Signed)
Cardiology Consultation:   Patient ID: Elijah White MRN: [id number]; DOB: [birth year]  Admit date: 03/15/2021 Date of Consult: 03/17/2021  PCP:  Maryella Shivers, [facility] Providers Cardiologist:  Evalina Field, MD        Patient Profile:   Elijah White is a 81 y.o. male with a hx of ESRD who is being seen 03/17/2021 for the evaluation of atrial fibrillation with rvr at the request of Dr. Eulogio Bear.  History of Present Illness:   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  The patient was transferred yesterday to the cardiac progressive floor for concern of his EKG that he was in secondary Mobitz type II AV block, but I was able to review his EKG however I do appreciate sinus rhythm with nonconducted PAC.  This morning cardiology has been called due to A. fib with rapid ventricular rate.  The patient was seen and examined by me at his bedside.   Past Medical History:  Diagnosis Date   Anemia    low iron   Aortic stenosis    s/p TAVR 01/19/20   Arthritis    Cancer [facility])    prostate, s/p I-125 seed implant 05/18/05   Colon polyps ~ 1993 and 2003   Dr [name], [facility].  08/2001 colonoscopy: tubular adenoma at cecum.     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   Elevated cholesterol with high triglycerides    ESRD (end stage renal disease) (HCC)    [facility]   GERD (gastroesophageal reflux disease)    Glaucoma    Hypertension    Legally blind in left eye, as defined in [place]    has pinpoint vision in right eye   PAD (peripheral artery disease) ([place])    RBBB     Past Surgical History:  Procedure  Laterality Date   ABDOMINAL AORTOGRAM W/LOWER EXTREMITY N/A 07/25/2020   Procedure: ABDOMINAL AORTOGRAM W/LOWER EXTREMITY;  Surgeon: Marty Heck, MD;  Location: [place] [facility];  Service: Cardiovascular;  Laterality: N/A;   AMPUTATION Left 08/12/2020   Procedure: LEFT BELOW KNEE AMPUTATION;  Surgeon: Marty Heck, MD;  Location: [place];  Service: Vascular;  Laterality: Left;   AV FISTULA PLACEMENT Left 03/20/2018   Procedure: ARTERIOVENOUS (AV) FISTULA CREATION ARM;  Surgeon: [name], MD;  Location: [place];  Service: Vascular;  Laterality: Left;   [place] Left 05/21/2018   Procedure: LEFT BASILIC VEIN FISTULA SECOND STAGE;  Surgeon: [name], MD;  Location: [place];  Service: Vascular;  Laterality: Left;   COLONOSCOPY     COLONOSCOPY WITH PROPOFOL N/A 11/30/2019   Procedure: COLONOSCOPY WITH PROPOFOL;  Surgeon: Ronnette Juniper, MD;  Location: [place];  Service: Gastroenterology;  Laterality: N/A;   GLAUCOMA SURGERY  2019   multiple surgeries   HEMOSTASIS CLIP PLACEMENT  11/30/2019   Procedure: HEMOSTASIS CLIP PLACEMENT;  Surgeon: Ronnette Juniper, MD;  Location: [facility];  Service: Gastroenterology;;   IR PERC CHOLECYSTOSTOMY  03/16/2021   KNEE ARTHROSCOPY     MULTIPLE EXTRACTIONS WITH ALVEOLOPLASTY N/A 12/30/2019   Procedure: MULTIPLE EXTRACTION WITH ALVEOLOPLASTY;  Surgeon: Charlaine Dalton, DMD;  Location:  [place] [facility];  Service: Dentistry;  Laterality: N/A;   PERIPHERAL VASCULAR INTERVENTION Left 07/25/2020   Procedure: PERIPHERAL VASCULAR INTERVENTION;  Surgeon: Marty Heck, MD;  Location: [place] [facility];  Service: Cardiovascular;  Laterality: Left;  superficial femoral   POLYPECTOMY  11/30/2019   Procedure: POLYPECTOMY;  Surgeon: Ronnette Juniper, MD;  Location: [place];  Service: Gastroenterology;;   PROSTATE SURGERY     RIGHT/LEFT HEART CATH AND CORONARY ANGIOGRAPHY N/A 12/03/2019   Procedure: RIGHT/LEFT HEART CATH  AND CORONARY ANGIOGRAPHY;  Surgeon: Burnell Blanks, MD;  Location: [place] [facility];  Service: Cardiovascular;  Laterality: N/A;   SUBMUCOSAL TATTOO INJECTION  11/30/2019   Procedure: SUBMUCOSAL TATTOO INJECTION;  Surgeon: Ronnette Juniper, MD;  Location: [place];  Service: Gastroenterology;;   TEE WITHOUT CARDIOVERSION N/A 01/19/2020   Procedure: TRANSESOPHAGEAL ECHOCARDIOGRAM (TEE);  Surgeon: Burnell Blanks, MD;  Location: [place] [facility];  Service: Open Heart Surgery;  Laterality: N/A;   TRANSCATHETER AORTIC VALVE REPLACEMENT, TRANSFEMORAL Left 01/19/2020   Procedure: TRANSCATHETER AORTIC VALVE REPLACEMENT, LEFT TRANSFEMORAL;  Surgeon: Burnell Blanks, MD;  Location: [place] [facility];  Service: Open Heart Surgery;  Laterality: Left;       Inpatient Medications: Scheduled Meds:  (feeding supplement) PROSource Plus  30 mL Oral BID BM   aspirin EC  81 mg Oral q AM   atorvastatin  10 mg Oral q AM   brimonidine  1 drop Right Eye TID   carbidopa-levodopa  1 tablet Oral TID   Chlorhexidine Gluconate Cloth  6 each Topical Q0600   cinacalcet  60 mg Oral Q M,W,F-HD   digoxin  0.125 mg Intravenous Daily   docusate sodium  100 mg Oral BID   doxercalciferol  2 mcg Intravenous Q M,W,F-HD   gabapentin  100 mg Oral Daily   heparin injection (subcutaneous)  5,000 Units Subcutaneous Q8H   insulin aspart  0-6 Units Subcutaneous Q4H   Netarsudil-Latanoprost  1 drop Right Eye QHS   pantoprazole  40 mg Oral Q1200   pilocarpine  1 drop Right Eye QID   polyethylene glycol  17 g Oral QHS   sevelamer carbonate  1,600 mg Oral TID WC   Continuous Infusions:  sodium chloride     sodium chloride     amiodarone     Followed by   amiodarone     piperacillin-tazobactam (ZOSYN)  IV 2.25 g (03/17/21 0153)   PRN Meds: sodium chloride, sodium chloride, acetaminophen, hydrALAZINE, HYDROmorphone (DILAUDID) injection, ipratropium-albuterol, lidocaine (PF), lidocaine-prilocaine,  morphine injection, ondansetron **OR** ondansetron (ZOFRAN) IV, pentafluoroprop-tetrafluoroeth  Allergies:   No Known Allergies  Social History:   Social History   Socioeconomic History   Marital status: Widowed    Spouse name: Not on file   Number of children: Not on file   Years of education: Not on file   Highest education level: Not on file  Occupational History   Occupation: retired  Tobacco Use   Smoking status: Former    Packs/day: 0.50    Types: Cigarettes    Quit date: 03/23/2018    Years since quitting: 2.9   Smokeless tobacco: Never  Vaping Use   Vaping Use: Never used  Substance and Sexual Activity   Alcohol use: No   Drug use: No   Sexual activity: Not Currently  Other Topics Concern   Not on file  Social History Narrative   Right handed    Facility   Social Determinants of Health   Financial  Resource Strain: Low Risk    Difficulty of Paying Living Expenses: Not hard at all  Food Insecurity: No Food Insecurity   Worried About Charity fundraiser in the Last Year: Never true   Ran Out of Food in the Last Year: Never true  Transportation Needs: No Transportation Needs   Lack of Transportation (Medical): No   Lack of Transportation (Non-Medical): No  Physical Activity: Inactive   Days of Exercise per Week: 0 days   Minutes of Exercise per Session: 0 min  Stress: No Stress Concern Present   Feeling of Stress : Not at all  Social Connections: Not on file  Intimate Partner Violence: Not on file    Family History:    Family History  Problem Relation Age of Onset   CAD Mother      ROS:  Unable to obtain review of systems..     Physical Exam/Data:   Vitals:   03/17/21 1028 03/17/21 1047 03/17/21 1108 03/17/21 1142  BP:  (!) 108/56 (!) 96/48 (!) 100/58  Pulse: 77 64 (!) 48 (!) 118  Resp: 18 15 19 18   Temp: 97.7 F (36.5 C)     TempSrc: Oral     SpO2: 98% 99% 99% 95%  Weight:      Height:        Intake/Output Summary (Last 24 hours) at  03/17/2021 1226 Last data filed at 03/17/2021 1136 Gross per 24 hour  Intake 481.8 ml  Output 601 ml  Net -119.2 ml   Last 3 Weights 03/16/2021 03/15/2021 01/05/2021  Weight (lbs) 143 lb 4.8 oz 143 lb 1.3 oz 143 lb  Weight (kg) 65 kg 64.9 kg 64.864 kg     Body mass index is 23.85 kg/m.  General:  Well nourished, well developed, in no acute distress HEENT: normal Neck: no JVD Vascular: No carotid bruits; Distal pulses 2+ bilaterally Cardiac:  normal S1, S2; RRR; no murmur  Lungs:  clear to auscultation bilaterally, no wheezing, rhonchi or rales  Abd: soft, nontender, no hepatomegaly  Ext: no edema Musculoskeletal:  No deformities, BUE and BLE strength normal and equal Skin: warm and dry  Neuro:  CNs 2-12 intact, no focal abnormalities noted Psych:  Normal affect   EKG:  The EKG was personally reviewed and demonstrates:   Telemetry:  Telemetry was personally reviewed and demonstrates:    Relevant CV Studies:  TTE 01/05/2021 IMPRESSIONS     1. Intracavitary gradient. Peak velocity 1.8 m/s. Peak gradient 13 mmHg.  Left ventricular ejection fraction, by estimation, is 65 to 70%. The left  ventricle has normal function. The left ventricle has no regional wall  motion abnormalities. Left  ventricular diastolic parameters are consistent with Grade I diastolic  dysfunction (impaired relaxation). Elevated left ventricular end-diastolic  pressure.   2. Right ventricular systolic function is normal. The right ventricular  size is normal.   3. The mitral valve is normal in structure. No evidence of mitral valve  regurgitation. No evidence of mitral stenosis. Moderate mitral annular  calcification.   4. The aortic valve has been repaired/replaced. Aortic valve  regurgitation is not visualized. No aortic stenosis is present.   5. The inferior vena cava is normal in size with greater than 50%  respiratory variability, suggesting right atrial pressure of 3 mmHg.   FINDINGS   Left  Ventricle: Intracavitary gradient. Peak velocity 1.8 m/s. Peak  gradient 13 mmHg. Left ventricular ejection fraction, by estimation, is 65  to 70%. The left ventricle  has normal function. The left ventricle has no  regional wall motion abnormalities.  The left ventricular internal cavity size was normal in size. There is no  left ventricular hypertrophy. Left ventricular diastolic parameters are  consistent with Grade I diastolic dysfunction (impaired relaxation).  Elevated left ventricular end-diastolic   pressure.   Right Ventricle: The right ventricular size is normal. No increase in  right ventricular wall thickness. Right ventricular systolic function is  normal.   Left Atrium: Left atrial size was normal in size.   Right Atrium: Right atrial size was normal in size.   Pericardium: There is no evidence of pericardial effusion.   Mitral Valve: The mitral valve is normal in structure. Moderate mitral  annular calcification. No evidence of mitral valve regurgitation. No  evidence of mitral valve stenosis.   Tricuspid Valve: The tricuspid valve is normal in structure. Tricuspid  valve regurgitation is not demonstrated. No evidence of tricuspid  stenosis.   Aortic Valve: The aortic valve has been repaired/replaced. Aortic valve  regurgitation is not visualized. No aortic stenosis is present. Aortic  valve mean gradient measures 6.2 mmHg. Aortic valve peak gradient measures  11.3 mmHg. There is a 26 mm Sapien  prosthetic, stented (TAVR) valve present in the aortic position.   Pulmonic Valve: The pulmonic valve was normal in structure. Pulmonic valve  regurgitation is not visualized. No evidence of pulmonic stenosis.   Aorta: The aortic root is normal in size and structure.   Venous: The inferior vena cava is normal in size with greater than 50%  respiratory variability, suggesting right atrial pressure of 3 mmHg.   IAS/Shunts: No atrial level shunt detected by color flow  Doppler.       Laboratory Data:  High Sensitivity Troponin:  No results for input(s): TROPONINIHS in the last 720 hours.   Chemistry Recent Labs  Lab 03/16/21 0545 03/16/21 2200 03/17/21 0328  NA 139 139 137  K 5.4* 6.1* 6.0*  CL 85* 88* 91*  CO2 29 28 27   GLUCOSE 249* 170* 140*  BUN 41* 59* 64*  CREATININE 8.56* 9.69* 9.84*  CALCIUM 9.2 9.2 8.6*  GFRNONAA 6* 5* 5*  ANIONGAP 25* 23* 19*    Recent Labs  Lab 03/15/21 0855 03/16/21 0545 03/16/21 2200  PROT 7.4 8.5*  --   ALBUMIN 3.2* 2.6* 2.6*  AST 26 48*  --   ALT 10 14  --   ALKPHOS 92 98  --   BILITOT 1.0 1.0  --    Lipids No results for input(s): CHOL, TRIG, HDL, LABVLDL, LDLCALC, CHOLHDL in the last 168 hours.  Hematology Recent Labs  Lab 03/16/21 0251 03/16/21 2302 03/17/21 0328  WBC 40.7* 44.3* 42.7*  RBC 4.31 4.02* 3.82*  HGB 15.0 14.1 13.2  HCT 45.1 41.8 40.3  MCV 104.6* 104.0* 105.5*  MCH 34.8* 35.1* 34.6*  MCHC 33.3 33.7 32.8  RDW 14.4 14.4 14.6  PLT 343 491* 442*   Thyroid No results for input(s): TSH, FREET4 in the last 168 hours.  BNPNo results for input(s): BNP, PROBNP in the last 168 hours.  DDimer No results for input(s): DDIMER in the last 168 hours.   Radiology/Studies:  DG Chest 1 View  Result Date: 03/15/2021 CLINICAL DATA:  Abdominal pain and vomiting. Clinical concern for aspiration. EXAM: CHEST  1 VIEW COMPARISON:  06/24/2020 FINDINGS: Normal sized heart. Interval minimal patchy density at the right lung base. The left lung remains clear. Aortic valve stent. Thoracic spine degenerative changes. Left axillary  surgical clip. IMPRESSION: Minimal right basilar atelectasis, pneumonia or aspiration pneumonitis. Electronically Signed   By: Claudie Revering M.D.   On: 03/15/2021 14:07   CT ABDOMEN PELVIS W CONTRAST  Result Date: 03/17/2021 CLINICAL DATA:  Acute generalized abdominal pain. EXAM: CT ABDOMEN AND PELVIS WITH CONTRAST TECHNIQUE: Multidetector CT imaging of the abdomen and pelvis  was performed using the standard protocol following bolus administration of intravenous contrast. RADIATION DOSE REDUCTION: This exam was performed according to the departmental dose-optimization program which includes automated exposure control, adjustment of the mA and/or kV according to patient size and/or use of iterative reconstruction technique. CONTRAST:  68mL OMNIPAQUE IOHEXOL 300 MG/ML  SOLN COMPARISON:  March 15, 2021. FINDINGS: Lower chest: Right lower lobe pneumonia or atelectasis is noted. Hepatobiliary: Interval placement of percutaneous cholecystostomy tube is noted. Continued gallbladder distention and cholelithiasis is noted. No biliary dilatation is noted. The liver is unremarkable. Pancreas: Unremarkable. No pancreatic ductal dilatation or surrounding inflammatory changes. Spleen: Normal in size without focal abnormality. Adrenals/Urinary Tract: Stable 2.6 cm left adrenal nodule is noted. Stable right adrenal gland enlargement is noted. Mild bilateral renal atrophy is noted. No nephrolithiasis is noted. Moderate right hydroureteronephrosis is noted without obstructing calculus. Stable dilatation of left renal pelvis is noted without calyceal dilatation. Mild left ureteral dilatation is noted. No obstructing calculus is noted. Mild urinary bladder distention is noted. Stomach/Bowel: Stomach is within normal limits. Appendix appears normal. No evidence of bowel wall thickening, distention, or inflammatory changes. Vascular/Lymphatic: Aortic atherosclerosis. No enlarged abdominal or pelvic lymph nodes. Reproductive: Status post prostatic brachytherapy seed placement. Other: No abdominal wall hernia or abnormality. No abdominopelvic ascites. Musculoskeletal: No acute or significant osseous findings. IMPRESSION: Interval development of right lower lobe opacity is noted concerning for pneumonia or atelectasis. Interval placement of percutaneous cholecystostomy tube in grossly good position. Stable  gallbladder distension and cholelithiasis is noted. Grossly stable moderate right hydroureteronephrosis is noted as well as prominent left renal pelvis and mild distal left ureteral dilatation, without evidence of obstructing calculus. Mild urinary bladder distention is noted. 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 Aortic Atherosclerosis (ICD10-I70.0). Electronically Signed   By: Marijo Conception M.D.   On: 03/17/2021 09:41   US Abdomen Limited  Result Date: 03/15/2021 CLINICAL DATA:  Right upper quadrant pain EXAM: ULTRASOUND ABDOMEN LIMITED RIGHT UPPER QUADRANT COMPARISON:  None. FINDINGS: Gallbladder: Distended with sludge and calculi measuring up to 4 mm. No wall thickening visualized. No pericholecystic fluid. No sonographic Murphy sign noted by sonographer. Common bile duct: Diameter: 3 mm, normal Liver: Left lobe of liver is poorly visualized due to bowel gas. No focal lesion identified. Within normal limits in parenchymal echogenicity. Portal vein is patent on color Doppler imaging with normal direction of blood flow towards the liver. Other: None. IMPRESSION: Distended gallbladder with sludge and calculi but no additional sonographic evidence of acute cholecystitis. Electronically Signed   By: Macy Mis M.D.   On: 03/15/2021 11:40   IR Perc Cholecystostomy  Result Date: 03/16/2021 INDICATION: 81 year old male with right upper quadrant pain and imaging findings concerning for acute calculus cholecystitis. A nuclear medicine HIDA scan was ordered to confirm, however patient's clinical status has deteriorated overnight with significant increase in leukocytosis, new oxygen requirement and increasing pain. Therefore, we are proceeding directly with percutaneous transhepatic cholecystostomy tube placement. EXAM: CHOLECYSTOSTOMY MEDICATIONS: In patient currently on intravenous Zosyn. No additional antibiotic prophylaxis administered.  ANESTHESIA/SEDATION: Moderate (conscious) sedation was employed during this procedure. A total  of Versed 0.5 mg and Fentanyl 25 mcg and 0.5 mg Dilaudid was administered intravenously. Moderate Sedation Time: 10 minutes. The patient's level of consciousness and vital signs were monitored continuously by radiology nursing throughout the procedure under my direct supervision. FLUOROSCOPY TIME:  Fluoroscopy Time: 0 minutes 36 seconds (2 mGy). COMPLICATIONS: None immediate. PROCEDURE: Informed written consent was obtained from the patient after a thorough discussion of the procedural risks, benefits and alternatives. All questions were addressed. Maximal Sterile Barrier Technique was utilized including caps, mask, sterile gowns, sterile gloves, sterile drape, hand hygiene and skin antiseptic. A timeout was performed prior to the initiation of the procedure. The right upper quadrant was interrogated with ultrasound. The gallbladder is markedly distended. A suitable skin entry site was selected and marked. The overlying skin was sterilely prepped and draped in the standard fashion using chlorhexidine skin prep. Local anesthesia was attained by infiltration with 1% lidocaine. A small dermatotomy was made. Under real-time ultrasound guidance, a 21 gauge Accustick needle was advanced along a short transhepatic course and into the gallbladder lumen. Images were obtained and stored for the medical record. A 0.018 wire was advanced in the gallbladder lumen. The needle was exchanged for the Accustick transitional dilator which was advanced over the wire and into the gallbladder. Aspiration yields dark black bile. A sample was sent for Gram stain and culture. A 0.035 wire was coiled in the gallbladder lumen. The skin tract was dilated to 10 [place]. A Cook 10.2 [place] all-purpose drainage catheter was advanced over the wire and formed in the gallbladder. The catheter was connected to gravity bag drainage and secured to the skin  with 0 Prolene suture. Overall, the patient tolerated the procedure well. IMPRESSION: Successful placement of 10 French percutaneous transhepatic cholecystostomy tube for the indication of acute calculus cholecystitis. Electronically Signed   By: Jacqulynn Cadet M.D.   On: 03/16/2021 14:07   DG CHEST PORT 1 VIEW  Result Date: 03/17/2021 CLINICAL DATA:  Chest abdominal pain.  Aspiration in airway. EXAM: PORTABLE CHEST 1 VIEW COMPARISON:  AP chest 03/16/2021 FINDINGS: The patient is mildly rightward rotated. Within this limitation, cardiac silhouette and mediastinal contours are grossly unchanged and within normal limits. Calcification is seen within the aortic arch. 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 The lungs appear clear. No pleural effusion or pneumothorax. No acute skeletal abnormality. IMPRESSION: No acute lung process. Electronically Signed   By: Yvonne Kendall M.D.   On: 03/17/2021 09:21   DG CHEST PORT 1 VIEW  Result Date: 03/16/2021 CLINICAL DATA:  Hypoxia EXAM: PORTABLE CHEST 1 VIEW COMPARISON:  Chest x-ray dated March 15, 2021 FINDINGS: Cardiac and mediastinal contours are unchanged within normal limits. Prior transcatheter aortic valve replacement. Interval resolution of right basilar lung opacity, favor resolved atelectasis. No focal consolidation. No large pleural effusion pneumothorax. IMPRESSION: Interval resolution of right basilar lung opacity, favor resolved atelectasis. Electronically Signed   By: Yetta Glassman M.D.   On: 03/16/2021 07:46   CT Renal Stone Study  Result Date: 03/15/2021 CLINICAL DATA:  Right abdominal pain. EXAM: CT ABDOMEN AND PELVIS WITHOUT CONTRAST TECHNIQUE: Multidetector CT imaging of the abdomen and pelvis was performed following the standard protocol without IV contrast. RADIATION DOSE REDUCTION: This exam was performed according to the departmental dose-optimization program which includes automated exposure control, adjustment of the mA and/or kV  according to patient size and/or use of iterative reconstruction technique. COMPARISON:  CTA abdomen and pelvis 12/01/2019 FINDINGS: Lower chest: Trace right pleural fluid is new  from prior. Unchanged mild bibasilar bronchiectasis. Mild dependent right lower lobe ground-glass likely subsegmental atelectasis. A partially visualized ascending aortic endograft. Dense coronary artery calcifications are seen. No pericardial effusion. Lack of intra-articular fluid limits evaluation of the abdominal and pelvic organ parenchyma. The following findings are made within this limitation. Hepatobiliary: Smooth liver contours. No gross liver lesion is seen. The gallbladder is again moderately distended with numerous layering small gallstones, similar to prior. No definite gallbladder wall thickening. Pancreas: Unremarkable. No pancreatic ductal dilatation or surrounding inflammatory changes. Spleen: Normal in size without focal abnormality. Adrenals/Urinary Tract: Left adrenal 2.6 cm nodule is unchanged from 11/26/2019 with internal density of 15 Hounsfield units. Right adrenal 15 mm nodule is unchanged in size with internal density of 5 Hounsfield units suggesting a benign lipid rich adenoma. These both again are likely benign. Mild left-greater-than-right renal pelvis vascular calcifications. Mild bilateral renal cortical thinning is again seen. Bilateral moderate extrarenal pelves are again noted. There is again mild-to-moderate dilatation of the bilateral ureters diffusely down to the level of the ureteropelvic junction likely chronic. No obstructing renal stone is seen. No hydronephrosis. Within the limitations of lack of IV contrast, no contour deforming renal mass. The urinary bladder is grossly unremarkable. Stomach/Bowel: No bowel wall thickening. The terminal ileum is unremarkable. The appendix is within normal limits. No dilated loops of bowel to indicate bowel obstruction. Vascular/Lymphatic: No abdominal aortic  aneurysm. Dense vascular calcifications. No enlarged abdominal or pelvic lymph nodes. 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 Other: Small fat containing umbilical hernia, unchanged. Unchanged small fat containing left inguinal hernia. No free air or free fluid. Musculoskeletal: Mild multilevel degenerative disc changes. Old healed lateral right eleventh rib fracture unchanged. IMPRESSION: 1. No nephroureterolithiasis or hydronephrosis. 2. Cholelithiasis. 3. 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 4. Small fat containing umbilical and left inguinal hernias. 5. Trace right pleural effusion with mild subsegmental atelectasis. 6.  Aortic Atherosclerosis (ICD10-I70.0). Electronically Signed   By: Yvonne Kendall   On: 03/15/2021 11:14     Assessment and Plan:   Atrial fibrillation with rapid ventricular rate -this appears to be new onset I suspect this is in the setting of his infection.  I would recommend rate control strategy for now hopefully if we are able to get his rate control he will spontaneously convert to sinus rhythm.  His CHA2DS2-VASc score is 4 and  he should be on anticoagulation.  His hemoglobin stable therefore Eliquis 2.5 mg twice daily as patient is ESRD.  Repeat admitted echocardiogram to assess LV function.  Aortic stenosis status post TAVR - recent echo with stable bioprosthesis.  Hypertension-blood pressure on the lower end today we will continue to monitor.  Hyperlipidemia-on statins.   Risk Assessment/Risk Scores:        CHA2DS2-VASc Score = 4   This indicates a 4.8% annual risk of stroke. The patient's score is based upon: CHF History: 0 HTN History: 1 Diabetes History: 1 Stroke History: 0 Vascular Disease History: 0 Age Score: 2 Gender Score: 0         For questions or updates, please contact [place] Please consult [email or web address] for contact info under    SignedBerniece Salines, DO  03/17/2021 12:26 PM

## 2021-03-17 NOTE — Progress Notes (Signed)
Pt in 10/10 pain upon arrival to the floor. Transferred from 6N. Pt had just received IV dilaudid at 2022 before arriving on unit at 2042, but still complaining on 10/10 pain. We discussed and he agreed to give the medication some time to work. Around 2145 Elijah White was moaning and yelling in pain, complaining of the surgical pain to his drain site as well as 10/10 stomach cramping. Stated he felt like he had to have a bowel movement but couldn't. Pt's BP had also been soft since he arrived on unit, with systolics in high 20U-RKY 90s so I was tentative about giving a dose of dilaudid. Charge nurse notified and Rapid Response paged. When rapid made it to the floor the pt's BP was in 706C systolic and HR elevated, however he was tensing up in pain, so we weren't sure how accurate that measure was. When getting a set of vitals we could not get an oral temp, so we went to get a rectal and the patient started to have a dark black liquid bowel movement. Rapid advised to give dilaudid and IV zofran (pt had little emesis and stated he felt like throwing up). I pushed these meds and his pain started to subside. Lab work then came back with a potassium of 6.1 and LA of 2.1. Dr. Marlyce Huge paged and further labs, [facility], and a 250 NS bolus was ordered. Bolus and medications were given and pts vitals have stabilized and he is now resting. Will continue to monitor.

## 2021-03-17 NOTE — TOC Initial Note (Signed)
Transition of Care [facility]) - Initial/Assessment Note    Patient Details  Name: Elijah White MRN: [id number] Date of Birth: [birth year]  Transition of Care [facility] CM/SW Contact:    Milas Gain, [place] Phone Number: 03/17/2021, 5:16 PM  Clinical Narrative:                  Patient is from [facility] long term SNF. CSW spoke with patient. Patient plans to return to [facility] when medically ready for dc. CSW also spoke with Nicki with [facility] who confirmed patient is from there long term. Plan to return when medically ready. CSW will continue to follow and assist with patients dc planing needs.       Patient Goals and CMS Choice        Expected Discharge Plan and Services                                                Prior Living Arrangements/Services                       Activities of Daily Living Home Assistive Devices/Equipment: None ADL Screening (condition at time of admission) Patient's cognitive ability adequate to safely complete daily activities?: Yes Is the patient deaf or have difficulty hearing?: No Does the patient have difficulty seeing, even when wearing glasses/contacts?: Yes Does the patient have difficulty concentrating, remembering, or making decisions?: Yes Patient able to express need for assistance with ADLs?: Yes Does the patient have difficulty dressing or bathing?: Yes Independently performs ADLs?: Yes (appropriate for developmental age) 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) Weakness of Legs: None Weakness of Arms/Hands: None  Permission Sought/Granted                  Emotional Assessment              Admission diagnosis:  Acute cholecystitis [K81.0] Aspiration pneumonia ([place]) [J69.0] Right sided abdominal pain [R10.9] Calculus of gallbladder with acute cholecystitis without obstruction [K80.00] Leukocytosis, unspecified type [D72.829] Patient Active Problem  List   Diagnosis Date Noted   Acute cholecystitis 03/15/2021   Hypokalemia 09/28/2020   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, 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[place]) 08/12/2020   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   Acute on chronic diastolic heart failure ([place]) 01/19/2020   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   RBBB    Hypercalcemia 12/16/2019   Near syncope 12/13/2019   Moderate protein-calorie malnutrition ([place]) 12/04/2019   Severe aortic stenosis    ESRD (end stage renal disease) ([place]) 11/26/2019   Macrocytic anemia 11/26/2019   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   Colonic mass 11/26/2019   Allergy, unspecified, initial encounter 11/16/2019   Anaphylactic shock, unspecified, initial encounter 11/16/2019   Other long term (current) drug therapy 09/05/2019   Iron deficiency anemia, unspecified 07/13/2019   Encounter for immunization 04/17/2019   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   Dependence on renal dialysis ([place]) 04/16/2019   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 kidney disease, or unspecified chronic kidney disease 04/16/2019   Localized edema 04/16/2019   Malignant neoplasm of prostate ([place]) 04/16/2019   Nicotine dependence, unspecified, uncomplicated 11/25/3005   Other disorders of phosphorus metabolism 04/16/2019   Pain, unspecified 04/16/2019  Pruritus, unspecified 04/16/2019   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   Shortness of breath 04/16/2019   Unspecified glaucoma 04/16/2019   Unspecified urinary incontinence 04/16/2019   ARF (acute renal failure) ([place]) 03/25/2018   Vision loss of right eye 11/11/2017   Bilateral pseudophakia 07/11/2017   GERD (gastroesophageal reflux disease) 09/10/2016   HLD (hyperlipidemia) 09/10/2016   Essential hypertension 08/13/2016   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

## 2021-03-17 NOTE — Progress Notes (Signed)
Came back to re-evaluate patient.  BP low but very responsive to IVF.  Last BP was 117/69.  HR low 100s.  Abdomen much less tender to palpation.  Patient appears to be "dry heaving" so given dose of zofran.  30 min after dose appeared much more comfortable.  Needs HD.  If he continues to have issues with HR/BP may need ICU bed overnight for closer monitoring and ability to get HD.  Continue amio gtt and get stat echo. Eulogio Bear DO

## 2021-03-17 NOTE — Progress Notes (Signed)
°   03/17/21 1023  Assess: MEWS Score  BP (!) 98/50  Pulse Rate 61  ECG Heart Rate (!) 178  Resp 20  Level of Consciousness Alert  SpO2 100 %  O2 Device Nasal Cannula  O2 Flow Rate (L/min) 3 L/min  Assess: MEWS Score  MEWS Temp 0  MEWS Systolic 1  MEWS Pulse 3  MEWS RR 0  MEWS LOC 0  MEWS Score 4  MEWS Score Color Red  Assess: if the MEWS score is Yellow or Red  Were vital signs taken at a resting state? Yes  Focused Assessment Change from prior assessment (see assessment flowsheet)  Early Detection of Sepsis Score *See Row Information* Low  MEWS guidelines implemented *See Row Information* Yes  Treat  MEWS Interventions Escalated (See documentation below)  Take Vital Signs  Increase Vital Sign Frequency  Red: Q 1hr X 4 then Q 4hr X 4, if remains red, continue Q 4hrs  Escalate  MEWS: Escalate Red: discuss with charge nurse/RN and provider, consider discussing with RRT  Notify: Charge Nurse/RN  Name of Charge Nurse/RN Notified Brooke RN  Date Charge Nurse/RN Notified 03/17/21  Time Charge Nurse/RN Notified 1030  Notify: Provider  Provider Name/Title Dr Eliseo Squires  Date Provider Notified 03/17/21  Time Provider Notified 1023  Notification Type Page  Notification Reason Change in status  Provider response At bedside;See new orders  Date of Provider Response 03/17/21  Time of Provider Response [id number]   Patient with increased HR 150-170, EKG obtained revealed AFib RVR, Dr Eliseo Squires notified.  Dr Eliseo Squires and Dr Harriet Masson came to bedside to assess patient.  New orders for 524ml NS bolus and digoxin IV push were obtained and given.  Will continue to monitor patient.

## 2021-03-17 NOTE — Progress Notes (Signed)
°   03/16/21 2045  Assess: MEWS Score  Temp 97.7 F (36.5 C)  BP (!) 90/57  ECG Heart Rate (!) 101  Resp 13  Level of Consciousness Alert  O2 Device Nasal Cannula  O2 Flow Rate (L/min) 3 L/min  Assess: MEWS Score  MEWS Temp 0  MEWS Systolic 1  MEWS Pulse 1  MEWS RR 1  MEWS LOC 0  MEWS Score 3  MEWS Score Color Yellow  Assess: if the MEWS score is Yellow or Red  Were vital signs taken at a resting state? Yes  Focused Assessment No change from prior assessment  Early Detection of Sepsis Score *See Row Information* Low  MEWS guidelines implemented *See Row Information* Yes  Treat  Pain Scale 0-10  Pain Score 10  Pain Type Surgical pain;Acute pain  Pain Location Abdomen  Pain Orientation Right  Pain Onset On-going  Patients Stated Pain Goal 3  Pain Intervention(s) Repositioned  Multiple Pain Sites No  Take Vital Signs  Increase Vital Sign Frequency  Yellow: Q 2hr X 2 then Q 4hr X 2, if remains yellow, continue Q 4hrs  Escalate  MEWS: Escalate Yellow: discuss with charge nurse/RN and consider discussing with provider and RRT  Notify: Charge Nurse/RN  Name of Charge Nurse/RN Notified Marcelyn Ditty, RN  Date Charge Nurse/RN Notified 03/16/21  Time Charge Nurse/RN Notified 2050  Document  Patient Outcome Stabilized after interventions  Progress note created (see row info) Yes

## 2021-03-18 DIAGNOSIS — I739 Peripheral vascular disease, unspecified: Secondary | ICD-10-CM

## 2021-03-18 LAB — COMPREHENSIVE METABOLIC PANEL
ALT: 7 U/L (ref 0–44)
AST: 30 U/L (ref 15–41)
Albumin: 2 g/dL — ABNORMAL LOW (ref 3.5–5.0)
Alkaline Phosphatase: 79 U/L (ref 38–126)
Anion gap: 22 — ABNORMAL HIGH (ref 5–15)
BUN: 76 mg/dL — ABNORMAL HIGH (ref 8–23)
CO2: 21 mmol/L — ABNORMAL LOW (ref 22–32)
Calcium: 9 mg/dL (ref 8.9–10.3)
Chloride: 95 mmol/L — ABNORMAL LOW (ref 98–111)
Creatinine, Ser: 10.4 mg/dL — ABNORMAL HIGH (ref 0.61–1.24)
GFR, Estimated: 5 mL/min — ABNORMAL LOW (ref >60)
Glucose, Bld: 118 mg/dL — ABNORMAL HIGH (ref 70–99)
Potassium: 5.5 mmol/L — ABNORMAL HIGH (ref 3.5–5.1)
Sodium: 138 mmol/L (ref 135–145)
Total Bilirubin: 1 mg/dL (ref 0.3–1.2)
Total Protein: 6.3 g/dL — ABNORMAL LOW (ref 6.5–8.1)

## 2021-03-18 LAB — URINALYSIS, ROUTINE W REFLEX MICROSCOPIC

## 2021-03-18 LAB — CBC
HCT: 34.9 % — ABNORMAL LOW (ref 39.0–52.0)
Hemoglobin: 11.3 g/dL — ABNORMAL LOW (ref 13.0–17.0)
MCH: 34 pg (ref 26.0–34.0)
MCHC: 32.4 g/dL (ref 30.0–36.0)
MCV: 105.1 fL — ABNORMAL HIGH (ref 80.0–100.0)
Platelets: 421 10*3/uL — ABNORMAL HIGH (ref 150–400)
RBC: 3.32 MIL/uL — ABNORMAL LOW (ref 4.22–5.81)
RDW: 14.2 % (ref 11.5–15.5)
WBC: 25.8 10*3/uL — ABNORMAL HIGH (ref 4.0–10.5)
nRBC: 0.1 % (ref 0.0–0.2)

## 2021-03-18 LAB — URINALYSIS, MICROSCOPIC (REFLEX)
RBC / HPF: >50 RBC/hpf (ref 0–5)
Squamous Epithelial / HPF: NONE SEEN (ref 0–5)
WBC, UA: >50 WBC/hpf (ref 0–5)

## 2021-03-18 LAB — GLUCOSE, CAPILLARY
Glucose-Capillary: 101 mg/dL — ABNORMAL HIGH (ref 70–99)
Glucose-Capillary: 102 mg/dL — ABNORMAL HIGH (ref 70–99)
Glucose-Capillary: 123 mg/dL — ABNORMAL HIGH (ref 70–99)
Glucose-Capillary: 123 mg/dL — ABNORMAL HIGH (ref 70–99)
Glucose-Capillary: 94 mg/dL (ref 70–99)
Glucose-Capillary: 95 mg/dL (ref 70–99)

## 2021-03-18 LAB — HEPATITIS B SURFACE ANTIBODY, QUANTITATIVE: Hep B S AB Quant (Post): 10.2 m[IU]/mL (ref >9.9)

## 2021-03-18 LAB — HEPARIN LEVEL (UNFRACTIONATED): Heparin Unfractionated: 0.41 IU/mL (ref 0.30–0.70)

## 2021-03-18 MED ORDER — AMIODARONE HCL 200 MG PO TABS
400.0000 mg | ORAL_TABLET | Freq: Two times a day (BID) | ORAL | Status: DC
  Administered 2021-03-18 – 2021-03-22 (×8): 400 mg via ORAL
  Filled 2021-03-18 (×10): qty 2

## 2021-03-18 MED ORDER — HEPARIN (PORCINE) 25000 UT/250ML-% IV SOLN
1000.0000 [IU]/h | INTRAVENOUS | Status: DC
  Administered 2021-03-18: 1000 [IU]/h via INTRAVENOUS
  Filled 2021-03-18: qty 250

## 2021-03-18 MED ORDER — ACETAMINOPHEN 650 MG RE SUPP: 325.0000 mg | RECTAL | Status: DC | PRN

## 2021-03-18 MED ORDER — HALOPERIDOL LACTATE 5 MG/ML IJ SOLN
2.5000 mg | Freq: Once | INTRAMUSCULAR | Status: AC
  Administered 2021-03-18: 2.5 mg via INTRAVENOUS
  Filled 2021-03-18: qty 1

## 2021-03-18 MED ORDER — PANTOPRAZOLE SODIUM 40 MG IV SOLR
40.0000 mg | INTRAVENOUS | Status: DC
  Administered 2021-03-18 – 2021-03-24 (×7): 40 mg via INTRAVENOUS
  Filled 2021-03-18 (×7): qty 40

## 2021-03-18 NOTE — Progress Notes (Signed)
Patient ID: Elijah White, male   DOB: May 31, 1940, 81 y.o.   MRN: [id number] [facility] Surgery Progress Note     Subjective: CC-  Having severe abdominal pain, not relieved by perc chole drain. CT shows no evident other issues related to gallbladder or intestine  Objective: Vital signs in last 24 hours: Temp:  [97.6 F (36.4 C)-99.2 F (37.3 C)] 99.2 F (37.3 C) (01/14 0820) Pulse Rate:  [48-136] 136 (01/14 0752) Resp:  [10-21] 17 (01/14 0752) BP: (54-139)/(35-113) 117/103 (01/14 0752) SpO2:  [95 %-100 %] 100 % (01/14 0752) Last BM Date: 03/16/21  Intake/Output from previous day: 01/13 0701 - 01/14 0700 In: 1045.9 [I.V.:535.9; IV Piggyback:500] Out: 400 [Drains:350] Intake/Output this shift: No intake/output data recorded.  PE: Gen:  obvious distress secondary to pain Card:  regular but tachy in 110s Pulm:  overall CTAB Abd: soft but somewhat tender throughout his abdomen.  Perc chole drain with turbid brownish bilious fluid. Umbilical hernia soft without overlying skin changes. Psych: unable to really answer many questions secondary to pain, difficult to tell if he is altered   Lab Results:  Recent Labs    03/17/21 1701 03/18/21 0325  WBC 31.3* 25.8*  HGB 11.9* 11.3*  HCT 36.6* 34.9*  PLT 412* 421*   BMET Recent Labs    03/17/21 1701 03/18/21 0325  NA 137 138  K 5.6* 5.5*  CL 93* 95*  CO2 23 21*  GLUCOSE 130* 118*  BUN 73* 76*  CREATININE 10.14* 10.40*  CALCIUM 8.5* 9.0   PT/INR No results for input(s): LABPROT, INR in the last 72 hours. CMP     Component Value Date/Time   NA 138 03/18/2021 0325   NA 134 (A) 09/11/2019 0000   K 5.5 (H) 03/18/2021 0325   CL 95 (L) 03/18/2021 0325   CO2 21 (L) 03/18/2021 0325   GLUCOSE 118 (H) 03/18/2021 0325   BUN 76 (H) 03/18/2021 0325   BUN 34 (A) 09/11/2019 0000   CREATININE 10.40 (H) 03/18/2021 0325   CALCIUM 9.0 03/18/2021 0325   PROT 6.3 (L) 03/18/2021 0325   PROT 6.9 02/19/2018 1014   ALBUMIN  2.0 (L) 03/18/2021 0325   ALBUMIN 4.2 02/19/2018 1014   AST 30 03/18/2021 0325   ALT 7 03/18/2021 0325   ALKPHOS 79 03/18/2021 0325   BILITOT 1.0 03/18/2021 0325   BILITOT 0.5 02/19/2018 1014   GFRNONAA 5 (L) 03/18/2021 0325   GFRAA 7 (L) 12/03/2019 0710   Lipase     Component Value Date/Time   LIPASE 20 03/15/2021 0855       Studies/Results: CT ABDOMEN PELVIS W CONTRAST  Result Date: 03/17/2021 CLINICAL DATA:  Acute generalized abdominal pain. EXAM: CT ABDOMEN AND PELVIS WITH CONTRAST TECHNIQUE: Multidetector CT imaging of the abdomen and pelvis was performed using the standard protocol following bolus administration of intravenous contrast. RADIATION DOSE REDUCTION: This exam was performed according to the departmental dose-optimization program which includes automated exposure control, adjustment of the mA and/or kV according to patient size and/or use of iterative reconstruction technique. CONTRAST:  12mL OMNIPAQUE IOHEXOL 300 MG/ML  SOLN COMPARISON:  March 15, 2021. FINDINGS: Lower chest: Right lower lobe pneumonia or atelectasis is noted. Hepatobiliary: Interval placement of percutaneous cholecystostomy tube is noted. Continued gallbladder distention and cholelithiasis is noted. No biliary dilatation is noted. The liver is unremarkable. Pancreas: Unremarkable. No pancreatic ductal dilatation or surrounding inflammatory changes. Spleen: Normal in size without focal abnormality. Adrenals/Urinary Tract: Stable 2.6 cm left adrenal nodule  is noted. Stable right adrenal gland enlargement is noted. Mild bilateral renal atrophy is noted. No nephrolithiasis is noted. Moderate right hydroureteronephrosis is noted without obstructing calculus. Stable dilatation of left renal pelvis is noted without calyceal dilatation. Mild left ureteral dilatation is noted. No obstructing calculus is noted. Mild urinary bladder distention is noted. Stomach/Bowel: Stomach is within normal limits. Appendix appears  normal. No evidence of bowel wall thickening, distention, or inflammatory changes. Vascular/Lymphatic: Aortic atherosclerosis. No enlarged abdominal or pelvic lymph nodes. Reproductive: Status post prostatic brachytherapy seed placement. Other: No abdominal wall hernia or abnormality. No abdominopelvic ascites. Musculoskeletal: No acute or significant osseous findings. IMPRESSION: Interval development of right lower lobe opacity is noted concerning for pneumonia or atelectasis. Interval placement of percutaneous cholecystostomy tube in grossly good position. Stable gallbladder distension and cholelithiasis is noted. Grossly stable moderate right hydroureteronephrosis is noted as well as prominent left renal pelvis and mild distal left ureteral dilatation, without evidence of obstructing calculus. Mild urinary bladder distention is noted. 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 Aortic Atherosclerosis (ICD10-I70.0). Electronically Signed   By: Marijo Conception M.D.   On: 03/17/2021 09:41   IR Perc Cholecystostomy  Result Date: 03/16/2021 INDICATION: 81 year old male with right upper quadrant pain and imaging findings concerning for acute calculus cholecystitis. A nuclear medicine HIDA scan was ordered to confirm, however patient's clinical status has deteriorated overnight with significant increase in leukocytosis, new oxygen requirement and increasing pain. Therefore, we are proceeding directly with percutaneous transhepatic cholecystostomy tube placement. EXAM: CHOLECYSTOSTOMY MEDICATIONS: In patient currently on intravenous Zosyn. No additional antibiotic prophylaxis administered. ANESTHESIA/SEDATION: Moderate (conscious) sedation was employed during this procedure. A total of Versed 0.5 mg and Fentanyl 25 mcg and 0.5 mg Dilaudid was administered intravenously. Moderate Sedation Time: 10 minutes. The patient's level of consciousness and vital signs were  monitored continuously by radiology nursing throughout the procedure under my direct supervision. FLUOROSCOPY TIME:  Fluoroscopy Time: 0 minutes 36 seconds (2 mGy). COMPLICATIONS: None immediate. PROCEDURE: Informed written consent was obtained from the patient after a thorough discussion of the procedural risks, benefits and alternatives. All questions were addressed. Maximal Sterile Barrier Technique was utilized including caps, mask, sterile gowns, sterile gloves, sterile drape, hand hygiene and skin antiseptic. A timeout was performed prior to the initiation of the procedure. The right upper quadrant was interrogated with ultrasound. The gallbladder is markedly distended. A suitable skin entry site was selected and marked. The overlying skin was sterilely prepped and draped in the standard fashion using chlorhexidine skin prep. Local anesthesia was attained by infiltration with 1% lidocaine. A small dermatotomy was made. Under real-time ultrasound guidance, a 21 gauge Accustick needle was advanced along a short transhepatic course and into the gallbladder lumen. Images were obtained and stored for the medical record. A 0.018 wire was advanced in the gallbladder lumen. The needle was exchanged for the Accustick transitional dilator which was advanced over the wire and into the gallbladder. Aspiration yields dark black bile. A sample was sent for Gram stain and culture. A 0.035 wire was coiled in the gallbladder lumen. The skin tract was dilated to 10 [place]. A Cook 10.2 [place] all-purpose drainage catheter was advanced over the wire and formed in the gallbladder. The catheter was connected to gravity bag drainage and secured to the skin with 0 Prolene suture. Overall, the patient tolerated the procedure well. IMPRESSION: Successful placement of 10 French percutaneous transhepatic cholecystostomy tube for the indication of acute calculus cholecystitis. Electronically  Signed   By: Jacqulynn Cadet M.D.   On:  03/16/2021 14:07   DG CHEST PORT 1 VIEW  Result Date: 03/17/2021 CLINICAL DATA:  Chest abdominal pain.  Aspiration in airway. EXAM: PORTABLE CHEST 1 VIEW COMPARISON:  AP chest 03/16/2021 FINDINGS: The patient is mildly rightward rotated. Within this limitation, cardiac silhouette and mediastinal contours are grossly unchanged and within normal limits. Calcification is seen within the aortic arch. 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 The lungs appear clear. No pleural effusion or pneumothorax. No acute skeletal abnormality. IMPRESSION: No acute lung process. Electronically Signed   By: Yvonne Kendall M.D.   On: 03/17/2021 09:21   ECHOCARDIOGRAM LIMITED  Result Date: 03/17/2021    ECHOCARDIOGRAM LIMITED REPORT   Patient Name:   Elijah White Date of Exam: 03/17/2021 Medical Rec #:  [id number]       Height:       65.0 in Accession #:    [id number]      Weight:       143.3 lb Date of Birth:  [birth year]       BSA:          1.717 m Patient Age:    38 years        BP:           116/50 mmHg Patient Gender: M               HR:           114 bpm. Exam Location:  Inpatient Procedure: Cardiac Doppler, Color Doppler and Limited Echo                         STAT ECHO Reported to: Dr Gwyndolyn Kaufman on 03/17/2021 5:50:00 PM. Indications:    Hypotension  History:        Patient has no prior history of Echocardiogram examinations,                 most recent 01/05/2021.                 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  Sonographer:    Clayton Lefort RDCS (AE) Referring Phys: [id number] Darreld Mclean  Sonographer Comments: Image acquisition challenging due to respiratory motion. IMPRESSIONS  1. Left ventricular ejection fraction, by estimation, is 60 to 65%. The left ventricle has normal function. There is moderate concentric left ventricular hypertrophy.  2. Right ventricular systolic function is normal. The right ventricular size is normal.  3.  The mitral valve is degenerative. Severe mitral annular calcification.  4. 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 Echo findings are consistent with normal structure and function of the aortic valve prosthesis. Aortic valve mean gradient measures 5.0 mmHg. Aortic valve Vmax measures 1.40 m/s. DI 0.8. There is no paravalvular leak.  5. The inferior vena cava is normal in size with <50% respiratory variability, suggesting right atrial pressure of 8 mmHg. Comparison(s): Compared to prior TTE on 01/2021, there is no significant change. TAVR valve continues to be well seated with current gradient 61mmHg (previously 57mmHg). FINDINGS  Left Ventricle: Left ventricular ejection fraction, by estimation, is 60 to 65%. The left ventricle has normal function. The left ventricular internal cavity size was  normal in size. There is moderate concentric left ventricular hypertrophy. Right Ventricle: The right ventricular size is normal. Right ventricular systolic function is normal. Pericardium: There is no evidence of pericardial effusion. Mitral Valve: The mitral valve is degenerative in appearance. There is moderate thickening of the mitral valve leaflet(s). There is moderate calcification of the mitral valve leaflet(s). Severe mitral annular calcification. Tricuspid Valve: The tricuspid valve is normal in structure. Tricuspid valve regurgitation is trivial. Aortic Valve: DI 0.8. No PVL. The aortic valve has been repaired/replaced. Aortic valve mean gradient measures 5.0 mmHg. Aortic valve peak gradient measures 7.8 mmHg. Aortic valve area, by VTI measures 2.67 cm. 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 Echo findings are consistent with normal structure and function of the aortic valve prosthesis. Pulmonic Valve: The pulmonic valve was not well visualized. Pulmonic valve regurgitation is trivial.  Aorta: The aortic root and ascending aorta are structurally normal, with no evidence of dilitation. Venous: The inferior vena cava is normal in size with less than 50% respiratory variability, suggesting right atrial pressure of 8 mmHg. LEFT VENTRICLE PLAX 2D LVIDd:         3.90 cm LVIDs:         3.00 cm LV PW:         1.30 cm LV IVS:        1.20 cm LVOT diam:     2.00 cm LV SV:         61 LV SV Index:   36 LVOT Area:     3.14 cm  LEFT ATRIUM           Index LA diam:      3.40 cm 1.98 cm/m LA Vol (A4C): 20.0 ml 11.65 ml/m  AORTIC VALVE AV Area (Vmax):    2.51 cm AV Area (Vmean):   2.53 cm AV Area (VTI):     2.67 cm AV Vmax:           140.00 cm/s AV Vmean:          104.000 cm/s AV VTI:            0.228 m AV Peak Grad:      7.8 mmHg AV Mean Grad:      5.0 mmHg LVOT Vmax:         112.00 cm/s LVOT Vmean:        83.900 cm/s LVOT VTI:          0.194 m LVOT/AV VTI ratio: 0.85  AORTA Ao Root diam: 2.50 cm Ao Asc diam:  2.60 cm  SHUNTS Systemic VTI:  0.19 m Systemic Diam: 2.00 cm Gwyndolyn Kaufman MD Electronically signed by Gwyndolyn Kaufman MD Signature Date/Time: 03/17/2021/6:28:04 PM    Final     Anti-infectives: Anti-infectives (From admission, onward)    Start     Dose/Rate Route Frequency Ordered Stop   03/15/21 1400  piperacillin-tazobactam (ZOSYN) IVPB 2.25 g        2.25 g 100 mL/hr over 30 Minutes Intravenous Every 8 hours 03/15/21 1326     03/15/21 1330  Ampicillin-Sulbactam (UNASYN) 3 g in sodium chloride 0.9 % 100 mL IVPB  Status:  Discontinued        3 g 200 mL/hr over 30 Minutes Intravenous Every evening 03/15/21 1324 03/15/21 1326        Assessment/Plan Acute cholecystitis, diffuse abdominal pain - s/p perc chole drain, but this has not helped his pain at all -WBC  trending down - no 26 from 42 -K up, but likely secondary to missing HD yesterday, but can't rule out possibly ischemia based on exam -PCXR this am overall looks good and would not explain WBC.  No air under his diaphragm  though noted. -CT AP 1/13 - possible pna, drain in good position -keep NPO due to risk of aspiration and abdominal symptoms. -cont zosyn -vitals, labs, imaging, I/0s reviewed over last 24 hrs.  ID - Zosyn VTE - SCDs, hold chemical prophylaxis for possible procedure.  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    High Medical Decision Making    LOS: 3 days

## 2021-03-18 NOTE — Progress Notes (Signed)
IV team unable to establish second IV site for IV amiodarone.  Dr. Juliane Lack notified with new orders for PO amiodarone.  Will continue to monitor closely.

## 2021-03-18 NOTE — Progress Notes (Addendum)
Progress Note    [name]  GTO:[id number] DOB: Nov 07, 1940  DOA: 03/15/2021 PCP: Charlott Rakes, MD    Brief Narrative:    Medical records reviewed and are as summarized below:  Elijah White is an 81 y.o. male with medical history significant of ESRD on HD MWF, aortic stenosis status post TAVR 2021, nonobstructive CAD, Parkinson's disease, PVD on Plavix, s/p left BKA, HTN, IIDM, HLD,  came with recurrent right upper quadrant abdominal pain.  Thought to have cholecystitis and drain placed by IR.  Stay in hospital complicated by a fib with RVR, continued WBC count elevation and abdominal pain.  Found to have acute urinary retention needing placement of a foley.      Assessment/Plan:   Principal Problem:   Acute cholecystitis   Severe sepsis from UTI/acute cholecystitis  -finally starting to improve  Acute urinary retention/UTI -IV abx -foley catheter- coude ordered as nurse attempted and met some resistance -urology consult appreciated  Acute RUQ abdominal pain with leucocytosis -Secondary to cholelithiasis, suspect early acute cholecystitis with clinical Murphy sign positive. -IV abx (zosyn) -GS consult IR consult for percutaneous drain CT scan suggestive of perhaps a Right sided PNA-- on abx already  ESRD on HD -nephrology consulted.  -HD 1/14  A fib with RVR -dig x 1 -amiodarone started -cards consult much appreciated -old EKG reviewed again by cards and DOES NOT APPEAR to be mobitz type 2- more likely sinus with non-conducted PACs -heparin gtt per cards  HTN -holding home meds due to hypotension   Parkinson's disease -continue Sinemet when able to take PO   Diabetic neuropathy -Continue gabapentin when able to take PO  Hyperglycemia -SSI for now   Family Communication/Anticipated D/C date and plan/Code Status   DVT prophylaxis: heparin gtt Code Status: DNR Disposition Plan: Status is: Inpatient Spoke with daughter/son on  phone Remains inpatient appropriate because: needs IV abx         Medical Consultants:   IR GS Nephrology urology  Subjective:   Seen twice today-- much more awake during HD  Objective:    Vitals:   03/18/21 1307 03/18/21 1315 03/18/21 1330 03/18/21 1400  BP: 127/86 (!) 100/51 (!) 137/114 (!) 154/129  Pulse: 93 90 95 73  Resp: 18 (!) 22 20 (!) 22  Temp:      TempSrc:      SpO2: 97%   97%  Weight:      Height:        Intake/Output Summary (Last 24 hours) at 03/18/2021 1418 Last data filed at 03/18/2021 1100 Gross per 24 hour  Intake 1035.94 ml  Output 450 ml  Net 585.94 ml   Filed Weights   03/15/21 0824 03/16/21 2045 03/18/21 1240  Weight: 64.9 kg 65 kg 61.8 kg    Exam:    General: Appearance:    Frail/elderly male in no acute distress   Abdomen less tender  Lungs:     On [place], respirations unlabored  Heart:    Tachycardic.    MS:   Below knee amputation of left lower extremity is noted.   Neurologic:   Awake, alert, still confused but more interactive         Data Reviewed:   I have personally reviewed following labs and imaging studies:  Labs: Labs show the following:   Basic Metabolic Panel: Recent Labs  Lab 03/16/21 0545 03/16/21 2200 03/17/21 0328 03/17/21 1701 03/18/21 0325  NA 139 139 137 137 138  K 5.4*  6.1* 6.0* 5.6* 5.5*  CL 85* 88* 91* 93* 95*  CO2 $Re'29 28 27 23 'VIA$ 21*  GLUCOSE 249* 170* 140* 130* 118*  BUN 41* 59* 64* 73* 76*  CREATININE 8.56* 9.69* 9.84* 10.14* 10.40*  CALCIUM 9.2 9.2 8.6* 8.5* 9.0  PHOS  --  6.6*  --  6.7*  --    GFR Estimated Creatinine Clearance: 4.8 mL/min (A) (by C-G formula based on SCr of 10.4 mg/dL (H)). Liver Function Tests: Recent Labs  Lab 03/15/21 0855 03/16/21 0545 03/16/21 2200 03/17/21 0328 03/17/21 1701 03/18/21 0325  AST 26 48*  --  57*  --  30  ALT 10 14  --  15  --  7  ALKPHOS 92 98  --  106  --  79  BILITOT 1.0 1.0  --  1.4*  --  1.0  PROT 7.4 8.5*  --  7.1  --  6.3*   ALBUMIN 3.2* 2.6* 2.6* 2.4* 2.0* 2.0*   Recent Labs  Lab 03/15/21 0855  LIPASE 20   No results for input(s): AMMONIA in the last 168 hours. Coagulation profile No results for input(s): INR, PROTIME in the last 168 hours.  CBC: Recent Labs  Lab 03/15/21 0855 03/16/21 0251 03/16/21 2302 03/17/21 0328 03/17/21 1701 03/18/21 0325  WBC 19.1* 40.7* 44.3* 44.0*   42.7* 31.3* 25.8*  NEUTROABS 16.9*  --   --  40.8*  --   --   HGB 13.8 15.0 14.1 13.6   13.2 11.9* 11.3*  HCT 42.1 45.1 41.8 39.7   40.3 36.6* 34.9*  MCV 106.3* 104.6* 104.0* 105.0*   105.5* 104.9* 105.1*  PLT 414* 343 491* 455*   442* 412* 421*   Cardiac Enzymes: No results for input(s): CKTOTAL, CKMB, CKMBINDEX, TROPONINI in the last 168 hours. BNP (last 3 results) No results for input(s): PROBNP in the last 8760 hours. CBG: Recent Labs  Lab 03/17/21 2118 03/18/21 0036 03/18/21 0509 03/18/21 0817 03/18/21 1308  GLUCAP 121* 123* 95 123* 101*   D-Dimer: No results for input(s): DDIMER in the last 72 hours. Hgb A1c: No results for input(s): HGBA1C in the last 72 hours. Lipid Profile: No results for input(s): CHOL, HDL, LDLCALC, TRIG, CHOLHDL, LDLDIRECT in the last 72 hours. Thyroid function studies: No results for input(s): TSH, T4TOTAL, T3FREE, THYROIDAB in the last 72 hours.  Invalid input(s): FREET3 Anemia work up: No results for input(s): VITAMINB12, FOLATE, FERRITIN, TIBC, IRON, RETICCTPCT in the last 72 hours. Sepsis Labs: Recent Labs  Lab 03/16/21 2302 03/16/21 2323 03/17/21 0328 03/17/21 1701 03/18/21 0325  WBC 44.3*  --  44.0*   42.7* 31.3* 25.8*  LATICACIDVEN  --  2.1* 1.8 1.2  --     Microbiology Recent Results (from the past 240 hour(s))  Resp Panel by RT-PCR (Flu A&B, Covid) Nasopharyngeal Swab     Status: None   Collection Time: 03/15/21  1:35 PM   Specimen: Nasopharyngeal Swab; Nasopharyngeal(NP) swabs in vial transport medium  Result Value Ref Range Status   SARS Coronavirus 2  by RT PCR NEGATIVE NEGATIVE Final    Comment: (NOTE) SARS-CoV-2 target nucleic acids are NOT DETECTED.  The SARS-CoV-2 RNA is generally detectable in upper respiratory specimens during the acute phase of infection. The lowest concentration of SARS-CoV-2 viral copies this assay can detect is 138 copies/mL. A negative result does not preclude SARS-Cov-2 infection and should not be used as the sole basis for treatment or other patient management decisions. A negative result may occur with  improper specimen collection/handling, submission of specimen other than nasopharyngeal swab, presence of viral mutation(s) within the areas targeted by this assay, and inadequate number of viral copies(<138 copies/mL). A negative result must be combined with clinical observations, patient history, and epidemiological information. The expected result is Negative.  Fact Sheet for Patients:  EntrepreneurPulse.com.au  Fact Sheet for Healthcare Providers:  IncredibleEmployment.be  This test is no t yet approved or cleared by the [place] FDA and  has been authorized for detection and/or diagnosis of SARS-CoV-2 by FDA under an Emergency Use Authorization (EUA). This EUA will remain  in effect (meaning this test can be used) for the duration of the COVID-19 declaration under Section 564(b)(1) of the Act, 21 U.S.C.section 360bbb-3(b)(1), unless the authorization is terminated  or revoked sooner.       Influenza A by PCR NEGATIVE NEGATIVE Final   Influenza B by PCR NEGATIVE NEGATIVE Final    Comment: (NOTE) The Xpert Xpress SARS-CoV-2/FLU/RSV plus assay is intended as an aid in the diagnosis of influenza from Nasopharyngeal swab specimens and should not be used as a sole basis for treatment. Nasal washings and aspirates are unacceptable for Xpert Xpress SARS-CoV-2/FLU/RSV testing.  Fact Sheet for Patients: EntrepreneurPulse.com.au  Fact  Sheet for Healthcare Providers: IncredibleEmployment.be  This test is not yet approved or cleared by the [place] FDA and has been authorized for detection and/or diagnosis of SARS-CoV-2 by FDA under an Emergency Use Authorization (EUA). This EUA will remain in effect (meaning this test can be used) for the duration of the COVID-19 declaration under Section 564(b)(1) of the Act, 21 U.S.C. section 360bbb-3(b)(1), unless the authorization is terminated or revoked.  Performed at [facility], [place] [address], [place], [place] [address]   Body fluid culture w Gram Stain     Status: None (Preliminary result)   Collection Time: 03/16/21  8:16 AM   Specimen: BILE; Body Fluid  Result Value Ref Range Status   Specimen Description BILE  Final   Special Requests BILE  Final   Gram Stain NO WBC SEEN NO ORGANISMS SEEN   Final   Culture   Final    NO GROWTH 2 DAYS Performed at [facility], [place] [address], [place], [place] [address]    Report Status PENDING  Incomplete    Procedures and diagnostic studies:  CT ABDOMEN PELVIS W CONTRAST  Result Date: 03/17/2021 CLINICAL DATA:  Acute generalized abdominal pain. EXAM: CT ABDOMEN AND PELVIS WITH CONTRAST TECHNIQUE: Multidetector CT imaging of the abdomen and pelvis was performed using the standard protocol following bolus administration of intravenous contrast. RADIATION DOSE REDUCTION: This exam was performed according to the departmental dose-optimization program which includes automated exposure control, adjustment of the mA and/or kV according to patient size and/or use of iterative reconstruction technique. CONTRAST:  8mL OMNIPAQUE IOHEXOL 300 MG/ML  SOLN COMPARISON:  March 15, 2021. FINDINGS: Lower chest: Right lower lobe pneumonia or atelectasis is noted. Hepatobiliary: Interval placement of percutaneous cholecystostomy tube is noted. Continued gallbladder distention and cholelithiasis is noted. No biliary  dilatation is noted. The liver is unremarkable. Pancreas: Unremarkable. No pancreatic ductal dilatation or surrounding inflammatory changes. Spleen: Normal in size without focal abnormality. Adrenals/Urinary Tract: Stable 2.6 cm left adrenal nodule is noted. Stable right adrenal gland enlargement is noted. Mild bilateral renal atrophy is noted. No nephrolithiasis is noted. Moderate right hydroureteronephrosis is noted without obstructing calculus. Stable dilatation of left renal pelvis is noted without calyceal dilatation. Mild left ureteral dilatation  is noted. No obstructing calculus is noted. Mild urinary bladder distention is noted. Stomach/Bowel: Stomach is within normal limits. Appendix appears normal. No evidence of bowel wall thickening, distention, or inflammatory changes. Vascular/Lymphatic: Aortic atherosclerosis. No enlarged abdominal or pelvic lymph nodes. Reproductive: Status post prostatic brachytherapy seed placement. Other: No abdominal wall hernia or abnormality. No abdominopelvic ascites. Musculoskeletal: No acute or significant osseous findings. IMPRESSION: Interval development of right lower lobe opacity is noted concerning for pneumonia or atelectasis. Interval placement of percutaneous cholecystostomy tube in grossly good position. Stable gallbladder distension and cholelithiasis is noted. Grossly stable moderate right hydroureteronephrosis is noted as well as prominent left renal pelvis and mild distal left ureteral dilatation, without evidence of obstructing calculus. Mild urinary bladder distention is noted. 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 Aortic Atherosclerosis (ICD10-I70.0). Electronically Signed   By: Marijo Conception M.D.   On: 03/17/2021 09:41   DG CHEST PORT 1 VIEW  Result Date: 03/17/2021 CLINICAL DATA:  Chest abdominal pain.  Aspiration in airway. EXAM: PORTABLE CHEST 1 VIEW COMPARISON:  AP chest 03/16/2021 FINDINGS:  The patient is mildly rightward rotated. Within this limitation, cardiac silhouette and mediastinal contours are grossly unchanged and within normal limits. Calcification is seen within the aortic arch. 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 The lungs appear clear. No pleural effusion or pneumothorax. No acute skeletal abnormality. IMPRESSION: No acute lung process. Electronically Signed   By: Yvonne Kendall M.D.   On: 03/17/2021 09:21   ECHOCARDIOGRAM LIMITED  Result Date: 03/17/2021    ECHOCARDIOGRAM LIMITED REPORT   Patient Name:   Elijah White Date of Exam: 03/17/2021 Medical Rec #:  [id number]       Height:       65.0 in Accession #:    [id number]      Weight:       143.3 lb Date of Birth:  06-07-1940       BSA:          1.717 m Patient Age:    5 years        BP:           116/50 mmHg Patient Gender: M               HR:           114 bpm. Exam Location:  Inpatient Procedure: Cardiac Doppler, Color Doppler and Limited Echo                         STAT ECHO Reported to: Dr Gwyndolyn Kaufman on 03/17/2021 5:50:00 PM. Indications:    Hypotension  History:        Patient has no prior history of Echocardiogram examinations,                 most recent 01/05/2021.                 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  Sonographer:    Clayton Lefort RDCS (AE) Referring Phys: [id number] Darreld Mclean  Sonographer Comments: Image acquisition challenging due to respiratory motion. IMPRESSIONS  1. Left ventricular ejection fraction, by estimation, is 60 to 65%. The left ventricle has normal function. There is moderate concentric left ventricular hypertrophy.  2. Right ventricular systolic function is normal. The right  ventricular size is normal.  3. The mitral valve is degenerative. Severe mitral annular calcification.  4. 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  Echo findings are consistent with normal structure and function of the aortic valve prosthesis. Aortic valve mean gradient measures 5.0 mmHg. Aortic valve Vmax measures 1.40 m/s. DI 0.8. There is no paravalvular leak.  5. The inferior vena cava is normal in size with <50% respiratory variability, suggesting right atrial pressure of 8 mmHg. Comparison(s): Compared to prior TTE on 01/2021, there is no significant change. TAVR valve continues to be well seated with current gradient 69mmHg (previously 66mmHg). FINDINGS  Left Ventricle: Left ventricular ejection fraction, by estimation, is 60 to 65%. The left ventricle has normal function. The left ventricular internal cavity size was normal in size. There is moderate concentric left ventricular hypertrophy. Right Ventricle: The right ventricular size is normal. Right ventricular systolic function is normal. Pericardium: There is no evidence of pericardial effusion. Mitral Valve: The mitral valve is degenerative in appearance. There is moderate thickening of the mitral valve leaflet(s). There is moderate calcification of the mitral valve leaflet(s). Severe mitral annular calcification. Tricuspid Valve: The tricuspid valve is normal in structure. Tricuspid valve regurgitation is trivial. Aortic Valve: DI 0.8. No PVL. The aortic valve has been repaired/replaced. Aortic valve mean gradient measures 5.0 mmHg. Aortic valve peak gradient measures 7.8 mmHg. Aortic valve area, by VTI measures 2.67 cm. 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 Echo findings are consistent with normal structure and function of the aortic valve prosthesis. Pulmonic Valve: The pulmonic valve was not well visualized. Pulmonic valve regurgitation is trivial. Aorta: The aortic root and ascending aorta are structurally normal, with no evidence of dilitation. Venous: The inferior vena cava is normal in size with less than 50% respiratory variability,  suggesting right atrial pressure of 8 mmHg. LEFT VENTRICLE PLAX 2D LVIDd:         3.90 cm LVIDs:         3.00 cm LV PW:         1.30 cm LV IVS:        1.20 cm LVOT diam:     2.00 cm LV SV:         61 LV SV Index:   36 LVOT Area:     3.14 cm  LEFT ATRIUM           Index LA diam:      3.40 cm 1.98 cm/m LA Vol (A4C): 20.0 ml 11.65 ml/m  AORTIC VALVE AV Area (Vmax):    2.51 cm AV Area (Vmean):   2.53 cm AV Area (VTI):     2.67 cm AV Vmax:           140.00 cm/s AV Vmean:          104.000 cm/s AV VTI:            0.228 m AV Peak Grad:      7.8 mmHg AV Mean Grad:      5.0 mmHg LVOT Vmax:         112.00 cm/s LVOT Vmean:        83.900 cm/s LVOT VTI:          0.194 m LVOT/AV VTI ratio: 0.85  AORTA Ao Root diam: 2.50 cm Ao Asc diam:  2.60 cm  SHUNTS Systemic VTI:  0.19 m  Systemic Diam: 2.00 cm Gwyndolyn Kaufman MD Electronically signed by Gwyndolyn Kaufman MD Signature Date/Time: 03/17/2021/6:28:04 PM    Final     Medications:    (feeding supplement) PROSource Plus  30 mL Oral BID BM   brimonidine  1 drop Right Eye TID   carbidopa-levodopa  1 tablet Oral TID   Chlorhexidine Gluconate Cloth  6 each Topical Q0600   cinacalcet  60 mg Oral Q M,W,F-HD   doxercalciferol  2 mcg Intravenous Q M,W,F-HD   insulin aspart  0-6 Units Subcutaneous Q4H   latanoprost  1 drop Right Eye QHS   pantoprazole (PROTONIX) IV  40 mg Intravenous Q24H   pilocarpine  1 drop Right Eye QID   sevelamer carbonate  1,600 mg Oral TID WC   Continuous Infusions:  sodium chloride     sodium chloride     sodium chloride 50 mL/hr at 03/17/21 1638   amiodarone 30 mg/hr (03/18/21 1032)   heparin 1,000 Units/hr (03/18/21 1405)   piperacillin-tazobactam (ZOSYN)  IV 2.25 g (03/18/21 1403)     LOS: 3 days   Geradine Girt  [facility]   How to contact the [facility] Attending or Consulting provider [place] or covering provider during after hours [place], for this patient?  Check the care team in [facility] and look for a) attending/consulting  TRH provider listed and b) the [facility] team listed Log into [email or web address] and use [place]'s universal password to access. If you do not have the password, please contact the hospital operator. Locate the [facility] provider you are looking for under [facility] and page to a number that you can be directly reached. If you still have difficulty reaching the provider, please page the [facility] (Director on Call) for the Hospitalists listed on amion for assistance.  03/18/2021, 2:18 PM

## 2021-03-18 NOTE — Progress Notes (Signed)
Progress Note  Patient Name: Elijah White Date of Encounter: 03/18/2021  [facility] Cardiologist: Evalina Field, MD   Subjective   Patient is delirious. His son is by the bedside. I discussed his atrial arrhythmia with him and his sister.  Soft Bps in the early AM with Afib up to 140s Converted to sinus rhythm around 8 AM s/p amio bolus and gtt ESRD    Inpatient Medications    Scheduled Meds:  (feeding supplement) PROSource Plus  30 mL Oral BID BM   brimonidine  1 drop Right Eye TID   carbidopa-levodopa  1 tablet Oral TID   Chlorhexidine Gluconate Cloth  6 each Topical Q0600   cinacalcet  60 mg Oral Q M,W,F-HD   doxercalciferol  2 mcg Intravenous Q M,W,F-HD   heparin injection (subcutaneous)  5,000 Units Subcutaneous Q8H   insulin aspart  0-6 Units Subcutaneous Q4H   latanoprost  1 drop Right Eye QHS   pantoprazole (PROTONIX) IV  40 mg Intravenous Q24H   pilocarpine  1 drop Right Eye QID   sevelamer carbonate  1,600 mg Oral TID WC   Continuous Infusions:  sodium chloride     sodium chloride     sodium chloride 50 mL/hr at 03/17/21 1638   amiodarone 30 mg/hr (03/18/21 1032)   piperacillin-tazobactam (ZOSYN)  IV 2.25 g (03/18/21 0627)   PRN Meds: sodium chloride, sodium chloride, acetaminophen, HYDROmorphone (DILAUDID) injection, ipratropium-albuterol, lidocaine (PF), lidocaine-prilocaine, ondansetron **OR** ondansetron (ZOFRAN) IV, pentafluoroprop-tetrafluoroeth   Vital Signs    Vitals:   03/18/21 0750 03/18/21 0752 03/18/21 0820 03/18/21 1121  BP: (!) 139/113 (!) 117/103  (!) 116/47  Pulse: 96 (!) 136  82  Resp: 19 17  16   Temp:   99.2 F (37.3 C)   TempSrc:   Axillary   SpO2: 100% 100%  99%  Weight:      Height:        Intake/Output Summary (Last 24 hours) at 03/18/2021 1149 Last data filed at 03/18/2021 1100 Gross per 24 hour  Intake 1035.94 ml  Output 450 ml  Net 585.94 ml   Last 3 Weights 03/16/2021 03/15/2021 01/05/2021  Weight (lbs) 143  lb 4.8 oz 143 lb 1.3 oz 143 lb  Weight (kg) 65 kg 64.9 kg 64.864 kg      Telemetry    Atrial fib with rvr, conversion to sinus rhythm around 8AM - Personally Reviewed  ECG    No new ecg today - Personally Reviewed  Physical Exam   Vitals:   03/18/21 0820 03/18/21 1121  BP:  (!) 116/47  Pulse:  82  Resp:  16  Temp: 99.2 F (37.3 C)   SpO2:  99%    GEN: delirious , mittens on Neck: No sig JVD Cardiac: RRR, no murmurs, rubs, or gallops.  Respiratory: Clear to auscultation bilaterally. GI: Soft, nontender, non-distended right perc drain in place MS: No edema; No deformity. Neuro:  Nonfocal  Psych: Normal affect   Labs    High Sensitivity Troponin:  No results for input(s): TROPONINIHS in the last 720 hours.   Chemistry Recent Labs  Lab 03/16/21 0545 03/16/21 2200 03/17/21 0328 03/17/21 1701 03/18/21 0325  NA 139   < > 137 137 138  K 5.4*   < > 6.0* 5.6* 5.5*  CL 85*   < > 91* 93* 95*  CO2 29   < > 27 23 21*  GLUCOSE 249*   < > 140* 130* 118*  BUN 41*   < >  64* 73* 76*  CREATININE 8.56*   < > 9.84* 10.14* 10.40*  CALCIUM 9.2   < > 8.6* 8.5* 9.0  PROT 8.5*  --  7.1  --  6.3*  ALBUMIN 2.6*   < > 2.4* 2.0* 2.0*  AST 48*  --  57*  --  30  ALT 14  --  15  --  7  ALKPHOS 98  --  106  --  79  BILITOT 1.0  --  1.4*  --  1.0  GFRNONAA 6*   < > 5* 5* 5*  ANIONGAP 25*   < > 19* 21* 22*   < > = values in this interval not displayed.    Lipids No results for input(s): CHOL, TRIG, HDL, LABVLDL, LDLCALC, CHOLHDL in the last 168 hours.  Hematology Recent Labs  Lab 03/17/21 0328 03/17/21 1701 03/18/21 0325  WBC 44.0*   42.7* 31.3* 25.8*  RBC 3.78*   3.82* 3.49* 3.32*  HGB 13.6   13.2 11.9* 11.3*  HCT 39.7   40.3 36.6* 34.9*  MCV 105.0*   105.5* 104.9* 105.1*  MCH 36.0*   34.6* 34.1* 34.0  MCHC 34.3   32.8 32.5 32.4  RDW 14.5   14.6 14.6 14.2  PLT 455*   442* 412* 421*   Thyroid No results for input(s): TSH, FREET4 in the last 168 hours.  BNPNo results for  input(s): BNP, PROBNP in the last 168 hours.  DDimer No results for input(s): DDIMER in the last 168 hours.   Radiology    CT ABDOMEN PELVIS W CONTRAST  Result Date: 03/17/2021 CLINICAL DATA:  Acute generalized abdominal pain. EXAM: CT ABDOMEN AND PELVIS WITH CONTRAST TECHNIQUE: Multidetector CT imaging of the abdomen and pelvis was performed using the standard protocol following bolus administration of intravenous contrast. RADIATION DOSE REDUCTION: This exam was performed according to the departmental dose-optimization program which includes automated exposure control, adjustment of the mA and/or kV according to patient size and/or use of iterative reconstruction technique. CONTRAST:  61mL OMNIPAQUE IOHEXOL 300 MG/ML  SOLN COMPARISON:  March 15, 2021. FINDINGS: Lower chest: Right lower lobe pneumonia or atelectasis is noted. Hepatobiliary: Interval placement of percutaneous cholecystostomy tube is noted. Continued gallbladder distention and cholelithiasis is noted. No biliary dilatation is noted. The liver is unremarkable. Pancreas: Unremarkable. No pancreatic ductal dilatation or surrounding inflammatory changes. Spleen: Normal in size without focal abnormality. Adrenals/Urinary Tract: Stable 2.6 cm left adrenal nodule is noted. Stable right adrenal gland enlargement is noted. Mild bilateral renal atrophy is noted. No nephrolithiasis is noted. Moderate right hydroureteronephrosis is noted without obstructing calculus. Stable dilatation of left renal pelvis is noted without calyceal dilatation. Mild left ureteral dilatation is noted. No obstructing calculus is noted. Mild urinary bladder distention is noted. Stomach/Bowel: Stomach is within normal limits. Appendix appears normal. No evidence of bowel wall thickening, distention, or inflammatory changes. Vascular/Lymphatic: Aortic atherosclerosis. No enlarged abdominal or pelvic lymph nodes. Reproductive: Status post prostatic brachytherapy seed placement.  Other: No abdominal wall hernia or abnormality. No abdominopelvic ascites. Musculoskeletal: No acute or significant osseous findings. IMPRESSION: Interval development of right lower lobe opacity is noted concerning for pneumonia or atelectasis. Interval placement of percutaneous cholecystostomy tube in grossly good position. Stable gallbladder distension and cholelithiasis is noted. Grossly stable moderate right hydroureteronephrosis is noted as well as prominent left renal pelvis and mild distal left ureteral dilatation, without evidence of obstructing calculus. Mild urinary bladder distention is noted. Status post prostatic brachytherapy seed placement. 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 Aortic Atherosclerosis (ICD10-I70.0). Electronically Signed   By: Marijo Conception M.D.   On: 03/17/2021 09:41   IR Perc Cholecystostomy  Result Date: 03/16/2021 INDICATION: 81 year old male with right upper quadrant pain and imaging findings concerning for acute calculus cholecystitis. A nuclear medicine HIDA scan was ordered to confirm, however patient's clinical status has deteriorated overnight with significant increase in leukocytosis, new oxygen requirement and increasing pain. Therefore, we are proceeding directly with percutaneous transhepatic cholecystostomy tube placement. EXAM: CHOLECYSTOSTOMY MEDICATIONS: In patient currently on intravenous Zosyn. No additional antibiotic prophylaxis administered. ANESTHESIA/SEDATION: Moderate (conscious) sedation was employed during this procedure. A total of Versed 0.5 mg and Fentanyl 25 mcg and 0.5 mg Dilaudid was administered intravenously. Moderate Sedation Time: 10 minutes. The patient's level of consciousness and vital signs were monitored continuously by radiology nursing throughout the procedure under my direct supervision. FLUOROSCOPY TIME:  Fluoroscopy Time: 0 minutes 36 seconds (2 mGy). COMPLICATIONS: None immediate. PROCEDURE: Informed written consent was  obtained from the patient after a thorough discussion of the procedural risks, benefits and alternatives. All questions were addressed. Maximal Sterile Barrier Technique was utilized including caps, mask, sterile gowns, sterile gloves, sterile drape, hand hygiene and skin antiseptic. A timeout was performed prior to the initiation of the procedure. The right upper quadrant was interrogated with ultrasound. The gallbladder is markedly distended. A suitable skin entry site was selected and marked. The overlying skin was sterilely prepped and draped in the standard fashion using chlorhexidine skin prep. Local anesthesia was attained by infiltration with 1% lidocaine. A small dermatotomy was made. Under real-time ultrasound guidance, a 21 gauge Accustick needle was advanced along a short transhepatic course and into the gallbladder lumen. Images were obtained and stored for the medical record. A 0.018 wire was advanced in the gallbladder lumen. The needle was exchanged for the Accustick transitional dilator which was advanced over the wire and into the gallbladder. Aspiration yields dark black bile. A sample was sent for Gram stain and culture. A 0.035 wire was coiled in the gallbladder lumen. The skin tract was dilated to 10 [place]. A Cook 10.2 [place] all-purpose drainage catheter was advanced over the wire and formed in the gallbladder. The catheter was connected to gravity bag drainage and secured to the skin with 0 Prolene suture. Overall, the patient tolerated the procedure well. IMPRESSION: Successful placement of 10 French percutaneous transhepatic cholecystostomy tube for the indication of acute calculus cholecystitis. Electronically Signed   By: Jacqulynn Cadet M.D.   On: 03/16/2021 14:07   DG CHEST PORT 1 VIEW  Result Date: 03/17/2021 CLINICAL DATA:  Chest abdominal pain.  Aspiration in airway. EXAM: PORTABLE CHEST 1 VIEW COMPARISON:  AP chest 03/16/2021 FINDINGS: The patient is mildly rightward rotated.  Within this limitation, cardiac silhouette and mediastinal contours are grossly unchanged and within normal limits. Calcification is seen within the aortic arch. 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 The lungs appear clear. No pleural effusion or pneumothorax. No acute skeletal abnormality. IMPRESSION: No acute lung process. Electronically Signed   By: Yvonne Kendall M.D.   On: 03/17/2021 09:21   ECHOCARDIOGRAM LIMITED  Result Date: 03/17/2021    ECHOCARDIOGRAM LIMITED REPORT   Patient Name:   JAUN GALLUZZO Date of Exam: 03/17/2021 Medical Rec #:  [id number]       Height:       65.0 in Accession #:    [id number]      Weight:       143.3 lb Date  of Birth:  10/05/1940       BSA:          1.717 m Patient Age:    50 years        BP:           116/50 mmHg Patient Gender: M               HR:           114 bpm. Exam Location:  Inpatient Procedure: Cardiac Doppler, Color Doppler and Limited Echo                         STAT ECHO Reported to: Dr Gwyndolyn Kaufman on 03/17/2021 5:50:00 PM. Indications:    Hypotension  History:        Patient has no prior history of Echocardiogram examinations,                 most recent 01/05/2021.                 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  Sonographer:    Clayton Lefort RDCS (AE) Referring Phys: [id number] Darreld Mclean  Sonographer Comments: Image acquisition challenging due to respiratory motion. IMPRESSIONS  1. Left ventricular ejection fraction, by estimation, is 60 to 65%. The left ventricle has normal function. There is moderate concentric left ventricular hypertrophy.  2. Right ventricular systolic function is normal. The right ventricular size is normal.  3. The mitral valve is degenerative. Severe mitral annular calcification.  4. 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 Echo findings are consistent with normal  structure and function of the aortic valve prosthesis. Aortic valve mean gradient measures 5.0 mmHg. Aortic valve Vmax measures 1.40 m/s. DI 0.8. There is no paravalvular leak.  5. The inferior vena cava is normal in size with <50% respiratory variability, suggesting right atrial pressure of 8 mmHg. Comparison(s): Compared to prior TTE on 01/2021, there is no significant change. TAVR valve continues to be well seated with current gradient 71mmHg (previously 61mmHg). FINDINGS  Left Ventricle: Left ventricular ejection fraction, by estimation, is 60 to 65%. The left ventricle has normal function. The left ventricular internal cavity size was normal in size. There is moderate concentric left ventricular hypertrophy. Right Ventricle: The right ventricular size is normal. Right ventricular systolic function is normal. Pericardium: There is no evidence of pericardial effusion. Mitral Valve: The mitral valve is degenerative in appearance. There is moderate thickening of the mitral valve leaflet(s). There is moderate calcification of the mitral valve leaflet(s). Severe mitral annular calcification. Tricuspid Valve: The tricuspid valve is normal in structure. Tricuspid valve regurgitation is trivial. Aortic Valve: DI 0.8. No PVL. The aortic valve has been repaired/replaced. Aortic valve mean gradient measures 5.0 mmHg. Aortic valve peak gradient measures 7.8 mmHg. Aortic valve area, by VTI measures 2.67 cm. 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 Echo findings are consistent with normal structure and function of the aortic valve prosthesis. Pulmonic Valve: The pulmonic valve was not well visualized. Pulmonic valve regurgitation is trivial. Aorta: The aortic root and ascending aorta are structurally normal, with no evidence of dilitation. Venous: The inferior vena cava is normal in size with less  than 50% respiratory variability, suggesting right atrial pressure of 8 mmHg. LEFT  VENTRICLE PLAX 2D LVIDd:         3.90 cm LVIDs:         3.00 cm LV PW:         1.30 cm LV IVS:        1.20 cm LVOT diam:     2.00 cm LV SV:         61 LV SV Index:   36 LVOT Area:     3.14 cm  LEFT ATRIUM           Index LA diam:      3.40 cm 1.98 cm/m LA Vol (A4C): 20.0 ml 11.65 ml/m  AORTIC VALVE AV Area (Vmax):    2.51 cm AV Area (Vmean):   2.53 cm AV Area (VTI):     2.67 cm AV Vmax:           140.00 cm/s AV Vmean:          104.000 cm/s AV VTI:            0.228 m AV Peak Grad:      7.8 mmHg AV Mean Grad:      5.0 mmHg LVOT Vmax:         112.00 cm/s LVOT Vmean:        83.900 cm/s LVOT VTI:          0.194 m LVOT/AV VTI ratio: 0.85  AORTA Ao Root diam: 2.50 cm Ao Asc diam:  2.60 cm  SHUNTS Systemic VTI:  0.19 m Systemic Diam: 2.00 cm Gwyndolyn Kaufman MD Electronically signed by Gwyndolyn Kaufman MD Signature Date/Time: 03/17/2021/6:28:04 PM    Final     Cardiac Studies    TTE 03/17/2021 1. Left ventricular ejection fraction, by estimation, is 60 to 65%. The  left ventricle has normal function. There is moderate concentric left  ventricular hypertrophy.   2. Right ventricular systolic function is normal. The right ventricular  size is normal.   3. The mitral valve is degenerative. Severe mitral annular calcification.   4. The aortic valve has been repaired/replaced. There is a 26 mm Sapien  prosthetic (TAVR) valve present in the aortic position. Echo findings are  consistent with normal structure and function of the aortic valve  prosthesis. Aortic valve mean gradient  measures 5.0 mmHg. Aortic valve Vmax measures 1.40 m/s. DI 0.8. There is  no paravalvular leak.   5. The inferior vena cava is normal in size with <50% respiratory  variability, suggesting right atrial pressure of 8 mmHg.   Comparison(s): Compared to prior TTE on 01/2021, there is no significant  change. TAVR valve continues to be well seated with current gradient 71mmHg  (previously 52mmHg).    CHA2DS2-VASc Score = 4    This indicates a 4.8% annual risk of stroke. The patient's score is based upon: CHF History: 0 HTN History: 1 Diabetes History: 1 Stroke History: 0 Vascular Disease History: 0 Age Score: 2 Gender Score: 0      Patient Profile     SAAHAS HIDROGO is an 81 y.o. male with medical history significant of ESRD on HD MWF, aortic stenosis status post TAVR 2021, nonobstructive CAD, Parkinson's disease, PAD on Plavix, s/p left BKA, HTN, IIDM, HLD,  came with recurrent right upper quadrant abdominal pain.  Thought to have cholecystitis and drain placed by IR.  Stay in hospital complicated by a fib with RVR,  sepsis  2/2 acute cholecystitis ; bld cultures pending s/p perc drain on IV zosyn now in sinus rhythm on amio gtt  Assessment & Plan    #New Onset Atrial Fibrillation: Chads2vasc4. New onset in the setting of acute chole. He's not a good candidate for sotalol/dofetilide considering this requires a load and close Qtc follow up. Rate control was attempted but he had significant RVR with hypotension -converted to sinus rhythm - cont amio gtt  - can plan for amio load and transition to oral once he continues to maintain sinus rhythm - can likely continue with amiodarone long-term considering age/dementia  - heparin gtt for now (placed order for pharmacy);can convert to [facility] prior to DC (if surgical team needs to stop then there is risk of stroke but can defer)  #PAD: s/p L BKA - can hold plavix for now with possibility of surgery - can continue home statin  #S/p 26 mm Sapien prosthetic (TAVR) - Well seated. Normal mean gradient     For questions or updates, please contact [place] Please consult [email or web address] for contact info under        Signed, Janina Mayo, MD  03/18/2021, 11:49 AM

## 2021-03-18 NOTE — Consult Note (Signed)
Urology Consult   Reason for consult: hydronephrosis, urinary retention, possible pyocystis  History of Present Illness: Elijah White is a 81 y.o. M with ESRD, aortic stenosis, and DM who presented to the ED at [facility] with RUQ pain. He was diagnosed with acute cholecystis and had a drain placed by IR. Afterwards, his white count slowly trended down but remained elevated, and he had persistent abdominal pain. Further imaging was obtained and based on these findings urology was consulted. Additionally, Elijah White was in and out catheterized earlier today with return of purulent appearing urine. A urine culture is pending, and he is on zosyn IV. However, an indwelling foley was not able to be placed, and it was requested I aid in catheter placement.   I reviewed Elijah White imaging. His two CT scans from this admission show bilateral hydronephrosis, R>L in the setting of a full bladder. There is a CT angio from April of 2022 which demonstrates bilateral hydronephrosis, and the two more recent scans show perhaps only a slight, if any, increase in dilation of his collecting systems bilaterally. Additionally, there is a renal ultrasound from 2021 with no hydronephrosis while a foley was in place.   Elijah White has a history of prostate brachytherapy in 2007 and in the distant past was followed by our group for voiding issues.    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  08/2001 colonoscopy: tubular adenoma at cecum.     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   Elevated cholesterol with high triglycerides    ESRD (end stage renal disease) (HCC)    [facility] [facility] [employer]   GERD (gastroesophageal reflux disease)    Glaucoma    Hypertension    Legally blind in left eye, as defined in [place]     has pinpoint vision in right eye   PAD (peripheral artery disease) ([place])    RBBB     Past Surgical History:  Procedure Laterality Date   ABDOMINAL AORTOGRAM W/LOWER EXTREMITY N/A 07/25/2020   Procedure: ABDOMINAL AORTOGRAM W/LOWER EXTREMITY;  Surgeon: Marty Heck, MD;  Location: [place] [facility];  Service: Cardiovascular;  Laterality: N/A;   AMPUTATION Left 08/12/2020   Procedure: LEFT BELOW KNEE AMPUTATION;  Surgeon: Marty Heck, MD;  Location: [place];  Service: Vascular;  Laterality: Left;   AV FISTULA PLACEMENT Left 03/20/2018   Procedure: ARTERIOVENOUS (AV) FISTULA CREATION ARM;  Surgeon: [name], MD;  Location: [place];  Service: Vascular;  Laterality: Left;   [place] Left 05/21/2018   Procedure: LEFT BASILIC VEIN FISTULA SECOND STAGE;  Surgeon: [name], MD;  Location: [place];  Service: Vascular;  Laterality: Left;   COLONOSCOPY     COLONOSCOPY WITH PROPOFOL N/A 11/30/2019   Procedure: COLONOSCOPY WITH PROPOFOL;  Surgeon: Ronnette Juniper, MD;  Location: [place];  Service: Gastroenterology;  Laterality: N/A;   GLAUCOMA SURGERY  2019   multiple surgeries   HEMOSTASIS CLIP PLACEMENT  11/30/2019   Procedure: HEMOSTASIS CLIP PLACEMENT;  Surgeon: Ronnette Juniper, MD;  Location: [place];  Service: Gastroenterology;;   IR PERC CHOLECYSTOSTOMY  03/16/2021   KNEE ARTHROSCOPY     MULTIPLE  EXTRACTIONS WITH ALVEOLOPLASTY N/A 12/30/2019   Procedure: MULTIPLE EXTRACTION WITH ALVEOLOPLASTY;  Surgeon: Charlaine Dalton, DMD;  Location: [place];  Service: Dentistry;  Laterality: N/A;   PERIPHERAL VASCULAR INTERVENTION Left 07/25/2020   Procedure: PERIPHERAL VASCULAR INTERVENTION;  Surgeon: Marty Heck, MD;  Location: [place] [facility];  Service: Cardiovascular;  Laterality: Left;  superficial femoral   POLYPECTOMY  11/30/2019   Procedure: POLYPECTOMY;  Surgeon: Ronnette Juniper, MD;  Location: [place];  Service:  Gastroenterology;;   PROSTATE SURGERY     RIGHT/LEFT HEART CATH AND CORONARY ANGIOGRAPHY N/A 12/03/2019   Procedure: RIGHT/LEFT HEART CATH AND CORONARY ANGIOGRAPHY;  Surgeon: Burnell Blanks, MD;  Location: [place] [facility];  Service: Cardiovascular;  Laterality: N/A;   SUBMUCOSAL TATTOO INJECTION  11/30/2019   Procedure: SUBMUCOSAL TATTOO INJECTION;  Surgeon: Ronnette Juniper, MD;  Location: [place];  Service: Gastroenterology;;   TEE WITHOUT CARDIOVERSION N/A 01/19/2020   Procedure: TRANSESOPHAGEAL ECHOCARDIOGRAM (TEE);  Surgeon: Burnell Blanks, MD;  Location: [place] [facility];  Service: Open Heart Surgery;  Laterality: N/A;   TRANSCATHETER AORTIC VALVE REPLACEMENT, TRANSFEMORAL Left 01/19/2020   Procedure: TRANSCATHETER AORTIC VALVE REPLACEMENT, LEFT TRANSFEMORAL;  Surgeon: Burnell Blanks, MD;  Location: [place] [facility];  Service: Open Heart Surgery;  Laterality: Left;    Current Hospital Medications:  Home Meds:  No current facility-administered medications on file prior to encounter.   Current Outpatient Medications on File Prior to Encounter  Medication Sig Dispense Refill   acetaminophen (TYLENOL) 500 MG tablet Take 1,000 mg by mouth every 6 (six) hours as needed for moderate pain or headache.     aspirin EC 81 MG tablet Take 81 mg by mouth in the morning. Swallow whole.     atorvastatin (LIPITOR) 10 MG tablet Take 1 tablet (10 mg total) by mouth in the morning.     AURYXIA 1 GM 210 MG(Fe) tablet Take 420 mg by mouth 3 (three) times daily with meals.     B Complex-C-Folic Acid (DIALYVITE 096) 0.8 MG TABS Take 1 tablet by mouth in the morning.     brimonidine (ALPHAGAN) 0.15 % ophthalmic solution Place 1 drop into the right eye 3 (three) times daily.     carbidopa-levodopa (SINEMET) 25-100 MG tablet Take 1 tablet by mouth 3 (three) times daily. (Patient taking differently: Take 1 tablet by mouth 3 (three) times daily. 1 TABLET @ 8am/ 12pm/ 4pm) 90 tablet  1   clopidogrel (PLAVIX) 75 MG tablet Take 1 tablet (75 mg total) by mouth daily with breakfast. 30 tablet 5   docusate sodium (COLACE) 100 MG capsule Take 100 mg by mouth 2 (two) times daily.     dorzolamide-timolol (COSOPT) 22.3-6.8 MG/ML ophthalmic solution Place 1 drop into the right eye 2 (two) times daily.  11   gabapentin (NEURONTIN) 100 MG capsule Take 1 capsule (100 mg total) by mouth daily. 30 capsule 0   isosorbide mononitrate (IMDUR) 30 MG 24 hr tablet Take 1 tablet (30 mg total) by mouth in the morning.     Melatonin 5 MG CAPS Take 5 mg by mouth at bedtime.     pantoprazole (PROTONIX) 40 MG tablet Take 1 tablet (40 mg total) by mouth daily at 12 noon.     pilocarpine (PILOCAR) 4 % ophthalmic solution Place 1 drop into the right eye 4 (four) times daily.      polyethylene glycol (MIRALAX / GLYCOLAX) 17 g packet Take 17 g by mouth at bedtime.  ROCKLATAN 0.02-0.005 % SOLN Place 1 drop into the right eye at bedtime.     blood glucose meter kit and supplies KIT Dispense based on patient and insurance preference. Use up to four times daily as directed. (FOR ICD-9 250.00, 250.01). For QAC - HS accuchecks. 1 each 1   carvedilol (COREG) 3.125 MG tablet Take 1 tablet (3.125 mg total) by mouth 2 (two) times daily with a meal. (Patient not taking: Reported on 03/15/2021)     Doxercalciferol (HECTOROL IV) Doxercalciferol (Hectorol)     Lancets (ONETOUCH DELICA PLUS [id number]) MISC      Methoxy PEG-Epoetin Beta (MIRCERA IJ) Mircera     ONETOUCH VERIO test strip USE AS DIRECTED TO TEST FOUR TIMES A DAY 100 each 3   oxyCODONE-acetaminophen (PERCOCET) 5-325 MG tablet Take 1 tablet by mouth every 6 (six) hours as needed (pain). (Patient not taking: Reported on 03/15/2021) 28 tablet 0     Scheduled Meds:  (feeding supplement) PROSource Plus  30 mL Oral BID BM   brimonidine  1 drop Right Eye TID   carbidopa-levodopa  1 tablet Oral TID   Chlorhexidine Gluconate Cloth  6 each Topical Q0600    cinacalcet  60 mg Oral Q M,W,F-HD   doxercalciferol  2 mcg Intravenous Q M,W,F-HD   insulin aspart  0-6 Units Subcutaneous Q4H   latanoprost  1 drop Right Eye QHS   pantoprazole (PROTONIX) IV  40 mg Intravenous Q24H   pilocarpine  1 drop Right Eye QID   sevelamer carbonate  1,600 mg Oral TID WC   Continuous Infusions:  sodium chloride     sodium chloride     sodium chloride 50 mL/hr at 03/17/21 1638   amiodarone 30 mg/hr (03/18/21 1032)   heparin     piperacillin-tazobactam (ZOSYN)  IV 2.25 g (03/18/21 0627)   PRN Meds:.sodium chloride, sodium chloride, acetaminophen, HYDROmorphone (DILAUDID) injection, ipratropium-albuterol, lidocaine (PF), lidocaine-prilocaine, ondansetron **OR** ondansetron (ZOFRAN) IV, pentafluoroprop-tetrafluoroeth  Allergies: No Known Allergies  Family History  Problem Relation Age of Onset   CAD Mother     Social History:  reports that he quit smoking about 2 years ago. His smoking use included cigarettes. He smoked an average of .5 packs per day. He has never used smokeless tobacco. He reports that he does not drink alcohol and does not use drugs.  ROS: A complete review of systems was performed.  All systems are negative except for pertinent findings as noted.  Physical Exam:  Vital signs in last 24 hours: Temp:  [97.6 F (36.4 C)-99.2 F (37.3 C)] 97.7 F (36.5 C) (01/14 1240) Pulse Rate:  [64-136] 93 (01/14 1307) Resp:  [10-21] 18 (01/14 1307) BP: (54-139)/(35-113) 127/86 (01/14 1307) SpO2:  [97 %-100 %] 97 % (01/14 1307) Weight:  [61.8 kg] 61.8 kg (01/14 1240) Constitutional:  Alert and oriented, No acute distress Cardiovascular: Regular rate and rhythm Respiratory: Normal respiratory effort, Lungs clear bilaterally GI: Abdomen is soft, nontender, nondistended, no abdominal masses GU: No CVA tenderness; circumcised phallus Neurologic: Grossly intact, no focal deficits Psychiatric: Normal mood and affect  Laboratory Data:  Recent Labs     03/16/21 0251 03/16/21 2302 03/17/21 0328 03/17/21 1701 03/18/21 0325  WBC 40.7* 44.3* 44.0*   42.7* 31.3* 25.8*  HGB 15.0 14.1 13.6   13.2 11.9* 11.3*  HCT 45.1 41.8 39.7   40.3 36.6* 34.9*  PLT 343 491* 455*   442* 412* 421*    Recent Labs    03/16/21 0545 03/16/21 2200 03/17/21  6767 03/17/21 1701 03/18/21 0325  NA 139 139 137 137 138  K 5.4* 6.1* 6.0* 5.6* 5.5*  CL 85* 88* 91* 93* 95*  GLUCOSE 249* 170* 140* 130* 118*  BUN 41* 59* 64* 73* 76*  CALCIUM 9.2 9.2 8.6* 8.5* 9.0  CREATININE 8.56* 9.69* 9.84* 10.14* 10.40*     Results for orders placed or performed during the hospital encounter of 03/15/21 (from the past 24 hour(s))  Glucose, capillary     Status: Abnormal   Collection Time: 03/17/21  4:34 PM  Result Value Ref Range   Glucose-Capillary 116 (H) 70 - 99 mg/dL  Renal function panel     Status: Abnormal   Collection Time: 03/17/21  5:01 PM  Result Value Ref Range   Sodium 137 135 - 145 mmol/L   Potassium 5.6 (H) 3.5 - 5.1 mmol/L   Chloride 93 (L) 98 - 111 mmol/L   CO2 23 22 - 32 mmol/L   Glucose, Bld 130 (H) 70 - 99 mg/dL   BUN 73 (H) 8 - 23 mg/dL   Creatinine, Ser 10.14 (H) 0.61 - 1.24 mg/dL   Calcium 8.5 (L) 8.9 - 10.3 mg/dL   Phosphorus 6.7 (H) 2.5 - 4.6 mg/dL   Albumin 2.0 (L) 3.5 - 5.0 g/dL   GFR, Estimated 5 (L) >60 mL/min   Anion gap 21 (H) 5 - 15  CBC     Status: Abnormal   Collection Time: 03/17/21  5:01 PM  Result Value Ref Range   WBC 31.3 (H) 4.0 - 10.5 K/uL   RBC 3.49 (L) 4.22 - 5.81 MIL/uL   Hemoglobin 11.9 (L) 13.0 - 17.0 g/dL   HCT 36.6 (L) 39.0 - 52.0 %   MCV 104.9 (H) 80.0 - 100.0 fL   MCH 34.1 (H) 26.0 - 34.0 pg   MCHC 32.5 30.0 - 36.0 g/dL   RDW 14.6 11.5 - 15.5 %   Platelets 412 (H) 150 - 400 K/uL   nRBC 0.0 0.0 - 0.2 %  Lactic acid, plasma     Status: None   Collection Time: 03/17/21  5:01 PM  Result Value Ref Range   Lactic Acid, Venous 1.2 0.5 - 1.9 mmol/L  Glucose, capillary     Status: Abnormal   Collection Time:  03/17/21  9:18 PM  Result Value Ref Range   Glucose-Capillary 121 (H) 70 - 99 mg/dL  Glucose, capillary     Status: Abnormal   Collection Time: 03/18/21 12:36 AM  Result Value Ref Range   Glucose-Capillary 123 (H) 70 - 99 mg/dL  CBC     Status: Abnormal   Collection Time: 03/18/21  3:25 AM  Result Value Ref Range   WBC 25.8 (H) 4.0 - 10.5 K/uL   RBC 3.32 (L) 4.22 - 5.81 MIL/uL   Hemoglobin 11.3 (L) 13.0 - 17.0 g/dL   HCT 34.9 (L) 39.0 - 52.0 %   MCV 105.1 (H) 80.0 - 100.0 fL   MCH 34.0 26.0 - 34.0 pg   MCHC 32.4 30.0 - 36.0 g/dL   RDW 14.2 11.5 - 15.5 %   Platelets 421 (H) 150 - 400 K/uL   nRBC 0.1 0.0 - 0.2 %  Comprehensive metabolic panel     Status: Abnormal   Collection Time: 03/18/21  3:25 AM  Result Value Ref Range   Sodium 138 135 - 145 mmol/L   Potassium 5.5 (H) 3.5 - 5.1 mmol/L   Chloride 95 (L) 98 - 111 mmol/L   CO2 21 (L)  22 - 32 mmol/L   Glucose, Bld 118 (H) 70 - 99 mg/dL   BUN 76 (H) 8 - 23 mg/dL   Creatinine, Ser 10.40 (H) 0.61 - 1.24 mg/dL   Calcium 9.0 8.9 - 10.3 mg/dL   Total Protein 6.3 (L) 6.5 - 8.1 g/dL   Albumin 2.0 (L) 3.5 - 5.0 g/dL   AST 30 15 - 41 U/L   ALT 7 0 - 44 U/L   Alkaline Phosphatase 79 38 - 126 U/L   Total Bilirubin 1.0 0.3 - 1.2 mg/dL   GFR, Estimated 5 (L) >60 mL/min   Anion gap 22 (H) 5 - 15  Glucose, capillary     Status: None   Collection Time: 03/18/21  5:09 AM  Result Value Ref Range   Glucose-Capillary 95 70 - 99 mg/dL  Glucose, capillary     Status: Abnormal   Collection Time: 03/18/21  8:17 AM  Result Value Ref Range   Glucose-Capillary 123 (H) 70 - 99 mg/dL  Urinalysis, Routine w reflex microscopic     Status: Abnormal   Collection Time: 03/18/21 10:30 AM  Result Value Ref Range   Color, Urine BROWN (A) YELLOW   APPearance TURBID (A) CLEAR   Specific Gravity, Urine  1.005 - 1.030    TEST NOT REPORTED DUE TO COLOR INTERFERENCE OF URINE PIGMENT   pH  5.0 - 8.0    TEST NOT REPORTED DUE TO COLOR INTERFERENCE OF URINE  PIGMENT   Glucose, UA (A) NEGATIVE mg/dL    TEST NOT REPORTED DUE TO COLOR INTERFERENCE OF URINE PIGMENT   Hgb urine dipstick (A) NEGATIVE    TEST NOT REPORTED DUE TO COLOR INTERFERENCE OF URINE PIGMENT   Bilirubin Urine (A) NEGATIVE    TEST NOT REPORTED DUE TO COLOR INTERFERENCE OF URINE PIGMENT   Ketones, ur (A) NEGATIVE mg/dL    TEST NOT REPORTED DUE TO COLOR INTERFERENCE OF URINE PIGMENT   Protein, ur (A) NEGATIVE mg/dL    TEST NOT REPORTED DUE TO COLOR INTERFERENCE OF URINE PIGMENT   Nitrite (A) NEGATIVE    TEST NOT REPORTED DUE TO COLOR INTERFERENCE OF URINE PIGMENT   Leukocytes,Ua (A) NEGATIVE    TEST NOT REPORTED DUE TO COLOR INTERFERENCE OF URINE PIGMENT  Urinalysis, Microscopic (reflex)     Status: Abnormal   Collection Time: 03/18/21 10:30 AM  Result Value Ref Range   RBC / HPF >50 0 - 5 RBC/hpf   WBC, UA >50 0 - 5 WBC/hpf   Bacteria, UA MANY (A) NONE SEEN   Squamous Epithelial / LPF NONE SEEN 0 - 5   WBC Clumps PRESENT   Glucose, capillary     Status: Abnormal   Collection Time: 03/18/21  1:08 PM  Result Value Ref Range   Glucose-Capillary 101 (H) 70 - 99 mg/dL   Recent Results (from the past 240 hour(s))  Resp Panel by RT-PCR (Flu A&B, Covid) Nasopharyngeal Swab     Status: None   Collection Time: 03/15/21  1:35 PM   Specimen: Nasopharyngeal Swab; Nasopharyngeal(NP) swabs in vial transport medium  Result Value Ref Range Status   SARS Coronavirus 2 by RT PCR NEGATIVE NEGATIVE Final    Comment: (NOTE) SARS-CoV-2 target nucleic acids are NOT DETECTED.  The SARS-CoV-2 RNA is generally detectable in upper respiratory specimens during the acute phase of infection. The lowest concentration of SARS-CoV-2 viral copies this assay can detect is 138 copies/mL. A negative result does not preclude SARS-Cov-2 infection and should not be  used as the sole basis for treatment or other patient management decisions. A negative result may occur with  improper specimen  collection/handling, submission of specimen other than nasopharyngeal swab, presence of viral mutation(s) within the areas targeted by this assay, and inadequate number of viral copies(<138 copies/mL). A negative result must be combined with clinical observations, patient history, and epidemiological information. The expected result is Negative.  Fact Sheet for Patients:  EntrepreneurPulse.com.au  Fact Sheet for Healthcare Providers:  IncredibleEmployment.be  This test is no t yet approved or cleared by the [place] FDA and  has been authorized for detection and/or diagnosis of SARS-CoV-2 by FDA under an Emergency Use Authorization (EUA). This EUA will remain  in effect (meaning this test can be used) for the duration of the COVID-19 declaration under Section 564(b)(1) of the Act, 21 U.S.C.section 360bbb-3(b)(1), unless the authorization is terminated  or revoked sooner.       Influenza A by PCR NEGATIVE NEGATIVE Final   Influenza B by PCR NEGATIVE NEGATIVE Final    Comment: (NOTE) The Xpert Xpress SARS-CoV-2/FLU/RSV plus assay is intended as an aid in the diagnosis of influenza from Nasopharyngeal swab specimens and should not be used as a sole basis for treatment. Nasal washings and aspirates are unacceptable for Xpert Xpress SARS-CoV-2/FLU/RSV testing.  Fact Sheet for Patients: EntrepreneurPulse.com.au  Fact Sheet for Healthcare Providers: IncredibleEmployment.be  This test is not yet approved or cleared by the [place] FDA and has been authorized for detection and/or diagnosis of SARS-CoV-2 by FDA under an Emergency Use Authorization (EUA). This EUA will remain in effect (meaning this test can be used) for the duration of the COVID-19 declaration under Section 564(b)(1) of the Act, 21 U.S.C. section 360bbb-3(b)(1), unless the authorization is terminated or revoked.  Performed at [facility], [place] [address], [place], [place] [address]   Body fluid culture w Gram Stain     Status: None (Preliminary result)   Collection Time: 03/16/21  8:16 AM   Specimen: BILE; Body Fluid  Result Value Ref Range Status   Specimen Description BILE  Final   Special Requests BILE  Final   Gram Stain NO WBC SEEN NO ORGANISMS SEEN   Final   Culture   Final    NO GROWTH 2 DAYS Performed at [facility], [place] [address] [place], [place] [address]    Report Status PENDING  Incomplete    Renal Function: Recent Labs    03/15/21 0855 03/16/21 0545 03/16/21 2200 03/17/21 0328 03/17/21 1701 03/18/21 0325  CREATININE 7.25* 8.56* 9.69* 9.84* 10.14* 10.40*   Estimated Creatinine Clearance: 4.8 mL/min (A) (by C-G formula based on SCr of 10.4 mg/dL (H)).  Radiologic Imaging: CT ABDOMEN PELVIS W CONTRAST  Result Date: 03/17/2021 CLINICAL DATA:  Acute generalized abdominal pain. EXAM: CT ABDOMEN AND PELVIS WITH CONTRAST TECHNIQUE: Multidetector CT imaging of the abdomen and pelvis was performed using the standard protocol following bolus administration of intravenous contrast. RADIATION DOSE REDUCTION: This exam was performed according to the departmental dose-optimization program which includes automated exposure control, adjustment of the mA and/or kV according to patient size and/or use of iterative reconstruction technique. CONTRAST:  6m OMNIPAQUE IOHEXOL 300 MG/ML  SOLN COMPARISON:  March 15, 2021. FINDINGS: Lower chest: Right lower lobe pneumonia or atelectasis is noted. Hepatobiliary: Interval placement of percutaneous cholecystostomy tube is noted. Continued gallbladder distention and cholelithiasis is noted. No biliary dilatation is noted. The liver is unremarkable. Pancreas: Unremarkable. No pancreatic ductal  dilatation or surrounding inflammatory changes. Spleen: Normal in size without focal abnormality. Adrenals/Urinary Tract: Stable 2.6 cm left adrenal nodule is noted.  Stable right adrenal gland enlargement is noted. Mild bilateral renal atrophy is noted. No nephrolithiasis is noted. Moderate right hydroureteronephrosis is noted without obstructing calculus. Stable dilatation of left renal pelvis is noted without calyceal dilatation. Mild left ureteral dilatation is noted. No obstructing calculus is noted. Mild urinary bladder distention is noted. Stomach/Bowel: Stomach is within normal limits. Appendix appears normal. No evidence of bowel wall thickening, distention, or inflammatory changes. Vascular/Lymphatic: Aortic atherosclerosis. No enlarged abdominal or pelvic lymph nodes. Reproductive: Status post prostatic brachytherapy seed placement. Other: No abdominal wall hernia or abnormality. No abdominopelvic ascites. Musculoskeletal: No acute or significant osseous findings. IMPRESSION: Interval development of right lower lobe opacity is noted concerning for pneumonia or atelectasis. Interval placement of percutaneous cholecystostomy tube in grossly good position. Stable gallbladder distension and cholelithiasis is noted. Grossly stable moderate right hydroureteronephrosis is noted as well as prominent left renal pelvis and mild distal left ureteral dilatation, without evidence of obstructing calculus. Mild urinary bladder distention is noted. 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 Aortic Atherosclerosis (ICD10-I70.0). Electronically Signed   By: Marijo Conception M.D.   On: 03/17/2021 09:41   DG CHEST PORT 1 VIEW  Result Date: 03/17/2021 CLINICAL DATA:  Chest abdominal pain.  Aspiration in airway. EXAM: PORTABLE CHEST 1 VIEW COMPARISON:  AP chest 03/16/2021 FINDINGS: The patient is mildly rightward rotated. Within this limitation, cardiac silhouette and mediastinal contours are grossly unchanged and within normal limits. Calcification is seen within the aortic arch. 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 The lungs  appear clear. No pleural effusion or pneumothorax. No acute skeletal abnormality. IMPRESSION: No acute lung process. Electronically Signed   By: Yvonne Kendall M.D.   On: 03/17/2021 09:21   ECHOCARDIOGRAM LIMITED  Result Date: 03/17/2021    ECHOCARDIOGRAM LIMITED REPORT   Patient Name:   GRAE White Date of Exam: 03/17/2021 Medical Rec #:  [id number]       Height:       65.0 in Accession #:    [id number]      Weight:       143.3 lb Date of Birth:  April 07, 1940       BSA:          1.717 m Patient Age:    77 years        BP:           116/50 mmHg Patient Gender: M               HR:           114 bpm. Exam Location:  Inpatient Procedure: Cardiac Doppler, Color Doppler and Limited Echo                         STAT ECHO Reported to: Dr Gwyndolyn Kaufman on 03/17/2021 5:50:00 PM. Indications:    Hypotension  History:        Patient has no prior history of Echocardiogram examinations,                 most recent 01/05/2021.                 Aortic Valve: 26 mm Sapien prosthetic, stented (TAVR) valve is                 present in the  aortic position.  Sonographer:    Clayton Lefort RDCS (AE) Referring Phys: [id number] Darreld Mclean  Sonographer Comments: Image acquisition challenging due to respiratory motion. IMPRESSIONS  1. Left ventricular ejection fraction, by estimation, is 60 to 65%. The left ventricle has normal function. There is moderate concentric left ventricular hypertrophy.  2. Right ventricular systolic function is normal. The right ventricular size is normal.  3. The mitral valve is degenerative. Severe mitral annular calcification.  4. 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 Echo findings are consistent with normal structure and function of the aortic valve prosthesis. Aortic valve mean gradient measures 5.0 mmHg. Aortic valve Vmax measures 1.40 m/s. DI 0.8. There is no paravalvular leak.  5. The inferior vena cava is normal in  size with <50% respiratory variability, suggesting right atrial pressure of 8 mmHg. Comparison(s): Compared to prior TTE on 01/2021, there is no significant change. TAVR valve continues to be well seated with current gradient 73mHg (previously 672mg). FINDINGS  Left Ventricle: Left ventricular ejection fraction, by estimation, is 60 to 65%. The left ventricle has normal function. The left ventricular internal cavity size was normal in size. There is moderate concentric left ventricular hypertrophy. Right Ventricle: The right ventricular size is normal. Right ventricular systolic function is normal. Pericardium: There is no evidence of pericardial effusion. Mitral Valve: The mitral valve is degenerative in appearance. There is moderate thickening of the mitral valve leaflet(s). There is moderate calcification of the mitral valve leaflet(s). Severe mitral annular calcification. Tricuspid Valve: The tricuspid valve is normal in structure. Tricuspid valve regurgitation is trivial. Aortic Valve: DI 0.8. No PVL. The aortic valve has been repaired/replaced. Aortic valve mean gradient measures 5.0 mmHg. Aortic valve peak gradient measures 7.8 mmHg. Aortic valve area, by VTI measures 2.67 cm. 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 Echo findings are consistent with normal structure and function of the aortic valve prosthesis. Pulmonic Valve: The pulmonic valve was not well visualized. Pulmonic valve regurgitation is trivial. Aorta: The aortic root and ascending aorta are structurally normal, with no evidence of dilitation. Venous: The inferior vena cava is normal in size with less than 50% respiratory variability, suggesting right atrial pressure of 8 mmHg. LEFT VENTRICLE PLAX 2D LVIDd:         3.90 cm LVIDs:         3.00 cm LV PW:         1.30 cm LV IVS:        1.20 cm LVOT diam:     2.00 cm LV SV:         61 LV SV Index:   36 LVOT Area:     3.14 cm  LEFT ATRIUM            Index LA diam:      3.40 cm 1.98 cm/m LA Vol (A4C): 20.0 ml 11.65 ml/m  AORTIC VALVE AV Area (Vmax):    2.51 cm AV Area (Vmean):   2.53 cm AV Area (VTI):     2.67 cm AV Vmax:           140.00 cm/s AV Vmean:          104.000 cm/s AV VTI:            0.228 m AV Peak Grad:      7.8 mmHg AV Mean Grad:  5.0 mmHg LVOT Vmax:         112.00 cm/s LVOT Vmean:        83.900 cm/s LVOT VTI:          0.194 m LVOT/AV VTI ratio: 0.85  AORTA Ao Root diam: 2.50 cm Ao Asc diam:  2.60 cm  SHUNTS Systemic VTI:  0.19 m Systemic Diam: 2.00 cm Gwyndolyn Kaufman MD Electronically signed by Gwyndolyn Kaufman MD Signature Date/Time: 03/17/2021/6:28:04 PM    Final     I independently reviewed the above imaging studies.  Procedure The patient was prepped and draped in a sterile fashion. I carefully inserted a 16 fr coude catheter into the bladder without issue. There was return of purulent appearing urine. The balloon was filled with 10 cc sterile water and it was connected to the appropriate collection bag.   Impression/Recommendation 81 yo M with chronic bilateral hydronephrosis and pyocystis  -as mentioned above, his hydronephrosis appears chronic in nature and no acute intervention is indicated. -foley catheter placed; if pyocystis persists may need to change out catheter for 3 way to start irrigation -f/u urine culture; continue broad spectrum abx in the meantime.  Donald Pore MD 03/18/2021, 1:35 PM  [facility]  Pager: [phone]

## 2021-03-18 NOTE — Progress Notes (Signed)
HOSPITAL MEDICINE OVERNIGHT EVENT NOTE    Notified by nursing that patient has been pulling at his percutaneous drain due to continued confusion and agitation.  Nursing reports that the drain is still in place and has not been displaced with suture still intact.  There is no evidence of bleeding at the site.  Nursing additionally reports the patient has exhibited increasing agitation throughout the evening, frequently pulling at drains and lines and placing himself at risk as well as impeding medical care.  We will provide patient with 2.5 mg of intravenous Haldol.  Nursing has also placed mitts on the hands to try and limit his ability to pull at drains and lines going forward.  Finally, per my review of patient's vital signs overnight turns out patient had a single blood pressure of 69/39 at 8:57 PM.  Nursing reports that this was an erroneous blood pressure as the blood pressure was repeated 3 minutes later and was found to be 114/95.  Continue to monitor patient closely.  Vernelle Emerald  MD [facility]

## 2021-03-18 NOTE — Progress Notes (Signed)
SLP Cancellation Note  Patient Details Name: Elijah White MRN: [id number] DOB: [birth year]   Cancelled treatment:       Reason Eval/Treat Not Completed: Patient at procedure or test/unavailable.  Attempted BSE x2 today.  Pt not appropriate this AM and currently in HD.  SLP will re-attempt as schedule allows.    Elvia Collum Jarome Trull 03/18/2021, 12:53 PM

## 2021-03-18 NOTE — Evaluation (Signed)
Clinical/Bedside Swallow Evaluation Patient Details  Name: Elijah White MRN: [id number] Date of Birth: [birth year]  Today's Date: 03/18/2021 Time: SLP Start Time (ACUTE ONLY): 1430 SLP Stop Time (ACUTE ONLY): 1446 SLP Time Calculation (min) (ACUTE ONLY): 16 min  Past Medical History:  Past Medical History:  Diagnosis Date   Anemia    low iron   Aortic stenosis    s/p TAVR 01/19/20   Arthritis    Cancer ([place])    prostate, s/p I-125 seed implant 05/18/05   Colon polyps ~ 1993 and 2003   Dr [name], [facility].  08/2001 colonoscopy: tubular adenoma at cecum.     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   Elevated cholesterol with high triglycerides    ESRD (end stage renal disease) (HCC)    [facility]   GERD (gastroesophageal reflux disease)    Glaucoma    Hypertension    Legally blind in left eye, as defined in [place]    has pinpoint vision in right eye   PAD (peripheral artery disease) ([place])    RBBB    Past Surgical History:  Past Surgical History:  Procedure Laterality Date   ABDOMINAL AORTOGRAM W/LOWER EXTREMITY N/A 07/25/2020   Procedure: ABDOMINAL AORTOGRAM W/LOWER EXTREMITY;  Surgeon: Marty Heck, MD;  Location: [place] [facility];  Service: Cardiovascular;  Laterality: N/A;   AMPUTATION Left 08/12/2020   Procedure: LEFT BELOW KNEE AMPUTATION;  Surgeon: Marty Heck, MD;  Location: [place];  Service: Vascular;  Laterality: Left;   AV FISTULA PLACEMENT Left 03/20/2018   Procedure: ARTERIOVENOUS (AV) FISTULA CREATION ARM;  Surgeon: [name], MD;  Location: [place];  Service: Vascular;  Laterality: Left;   [place] Left 05/21/2018   Procedure: LEFT BASILIC VEIN FISTULA SECOND STAGE;  Surgeon: [name], MD;  Location: [place];  Service: Vascular;  Laterality: Left;   COLONOSCOPY     COLONOSCOPY WITH PROPOFOL N/A 11/30/2019   Procedure: COLONOSCOPY WITH PROPOFOL;  Surgeon:  Ronnette Juniper, MD;  Location: [place];  Service: Gastroenterology;  Laterality: N/A;   GLAUCOMA SURGERY  2019   multiple surgeries   HEMOSTASIS CLIP PLACEMENT  11/30/2019   Procedure: HEMOSTASIS CLIP PLACEMENT;  Surgeon: Ronnette Juniper, MD;  Location: [facility];  Service: Gastroenterology;;   IR PERC CHOLECYSTOSTOMY  03/16/2021   KNEE ARTHROSCOPY     MULTIPLE EXTRACTIONS WITH ALVEOLOPLASTY N/A 12/30/2019   Procedure: MULTIPLE EXTRACTION WITH ALVEOLOPLASTY;  Surgeon: Charlaine Dalton, DMD;  Location: [place];  Service: Dentistry;  Laterality: N/A;   PERIPHERAL VASCULAR INTERVENTION Left 07/25/2020   Procedure: PERIPHERAL VASCULAR INTERVENTION;  Surgeon: Marty Heck, MD;  Location: [place] [facility];  Service: Cardiovascular;  Laterality: Left;  superficial femoral   POLYPECTOMY  11/30/2019   Procedure: POLYPECTOMY;  Surgeon: Ronnette Juniper, MD;  Location: [place];  Service: Gastroenterology;;   PROSTATE SURGERY     RIGHT/LEFT HEART CATH AND CORONARY ANGIOGRAPHY N/A 12/03/2019   Procedure: RIGHT/LEFT HEART CATH AND CORONARY ANGIOGRAPHY;  Surgeon: Burnell Blanks, MD;  Location: [place] [facility];  Service: Cardiovascular;  Laterality: N/A;   SUBMUCOSAL TATTOO INJECTION  11/30/2019   Procedure: SUBMUCOSAL TATTOO INJECTION;  Surgeon: Ronnette Juniper, MD;  Location: [place];  Service: Gastroenterology;;   TEE WITHOUT CARDIOVERSION N/A 01/19/2020   Procedure: TRANSESOPHAGEAL ECHOCARDIOGRAM (TEE);  Surgeon: Burnell Blanks, MD;  Location: [place] [facility];  Service: Open Heart Surgery;  Laterality:  N/A;   TRANSCATHETER AORTIC VALVE REPLACEMENT, TRANSFEMORAL Left 01/19/2020   Procedure: TRANSCATHETER AORTIC VALVE REPLACEMENT, LEFT TRANSFEMORAL;  Surgeon: Burnell Blanks, MD;  Location: [place] [facility];  Service: Open Heart Surgery;  Laterality: Left;   HPI:  Elijah White is an 81 y.o. male with medical history significant of ESRD on HD MWF, aortic stenosis  status post TAVR 2021, nonobstructive CAD, Parkinson's disease, PVD on Plavix, s/p left BKA, HTN, IIDM, HLD,  came with recurrent right upper quadrant abdominal pain.  Thought to have cholecystitis and drain placed by IR.  Stay in hospital complicated by a fib with RVR, continued WBC count elevation and abdominal pain.    Assessment / Plan / Recommendation  Clinical Impression  Pt was seen for a bedside swallow evaluation.  He was alert, but confused and impulsive throughout this evaluation, requiring cues to follow directions.  Pt's oral cavity was noted to be dry upon SLP arrival with dry, cracked lips.  Oral care was completed following PO trials.  Pt was additionally noted to have missing dentition on the bottom, and he was edentulous on top.  Pt denied having dentures, and he stated that he consumes soft solids at home.  Family not present during evaluation to elaborate further regarding pt's baseline swallowing abilities.  Pt began with a trial of a small ice chip.  He accepted it into his oral cavity with good labial closure around the spoon; however, he exhibited decreased awareness of the bolus and appeared to let it fall into his pharynx without triggering a swallow given an immediate, prolonged cough and wet vocal quality.  Pt eventually triggered a swallow following cough.  Pt was then seen with straw sips of thin liquid and he appeared to have better oral control of the bolus.  He independently took small sips of liquid with only 1 delayed throat clear noted across 8 trials.  Suspect prolonged AP transit of puree, but no overt s/sx of aspiration noted.  Pt refused additional PO trials.  Recommend initiation of Dysphagia 1 (puree) solids and thin liquids with medication administered via alternative means with essential oral medications crushed in puree.  Suspect that pt may have decreased PO intake given current mentation.  He will require full assistance for all PO intake.  SLP will f/u to monitor  diet tolerance and advance as appropriate.  SLP Visit Diagnosis: Dysphagia, unspecified (R13.10)    Aspiration Risk  Mild aspiration risk    Diet Recommendation Dysphagia 1 (Puree);Thin liquid   Liquid Administration via: Straw Medication Administration: Via alternative means (essential oral meds crushed in puree) Supervision: Staff to assist with self feeding;Full supervision/cueing for compensatory strategies Compensations: Minimize environmental distractions;Slow rate;Small sips/bites Postural Changes: Seated upright at 90 degrees    Other  Recommendations Oral Care Recommendations: Oral care BID;Staff/trained caregiver to provide oral care Other Recommendations: Have oral suction available    Recommendations for follow up therapy are one component of a multi-disciplinary discharge planning process, led by the attending physician.  Recommendations may be updated based on patient status, additional functional criteria and insurance authorization.  Follow up Recommendations Skilled nursing-short term rehab (<3 hours/day)      Assistance Recommended at Discharge Frequent or constant Supervision/Assistance  Functional Status Assessment Patient has had a recent decline in their functional status and demonstrates the ability to make significant improvements in function in a reasonable and predictable amount of time.  Frequency and Duration min 2x/week  2 weeks  Prognosis Prognosis for Safe Diet Advancement: Fair Barriers to Reach Goals: Cognitive deficits      Swallow Study   General HPI: Elijah White is an 81 y.o. male with medical history significant of ESRD on HD MWF, aortic stenosis status post TAVR 2021, nonobstructive CAD, Parkinson's disease, PVD on Plavix, s/p left BKA, HTN, IIDM, HLD,  came with recurrent right upper quadrant abdominal pain.  Thought to have cholecystitis and drain placed by IR.  Stay in hospital complicated by a fib with RVR, continued WBC count  elevation and abdominal pain. Type of Study: Bedside Swallow Evaluation Previous Swallow Assessment: N/A Diet Prior to this Study: NPO Temperature Spikes Noted: Yes Respiratory Status: Nasal cannula History of Recent Intubation: No Behavior/Cognition: Alert;Confused;Impulsive;Requires cueing Oral Cavity Assessment: Dry Oral Care Completed by SLP: Yes Oral Cavity - Dentition: Missing dentition;Poor condition Vision: Impaired for self-feeding Self-Feeding Abilities: Total assist Patient Positioning: Upright in bed Baseline Vocal Quality: Normal Volitional Cough: Cognitively unable to elicit Volitional Swallow: Unable to elicit    Oral/Motor/Sensory Function Overall Oral Motor/Sensory Function: Other (comment) (Pt unable to follow commands to complete)   Ice Chips Ice chips: Impaired Presentation: Spoon Oral Phase Impairments: Poor awareness of bolus Pharyngeal Phase Impairments: Suspected delayed Swallow;Cough - Immediate   Thin Liquid Thin Liquid: Impaired Presentation: Straw Pharyngeal  Phase Impairments: Throat Clearing - Delayed    Nectar Thick Nectar Thick Liquid: Not tested   Honey Thick Honey Thick Liquid: Not tested   Puree Puree: Impaired Presentation: Spoon Oral Phase Functional Implications: Prolonged oral transit   Solid     Solid: Not tested     Bretta Bang, M.S., [place] Office: [phone]  Elijah White 03/18/2021,3:05 PM

## 2021-03-18 NOTE — Progress Notes (Signed)
ANTICOAGULATION CONSULT NOTE - Initial Consult  Pharmacy Consult for heparin Indication: atrial fibrillation  No Known Allergies  Patient Measurements: Height: 5\' 5"  (165.1 cm) Weight: 65 kg (143 lb 4.8 oz) IBW/kg (Calculated) : 61.5 Heparin Dosing Weight: 65 kg   Vital Signs: Temp: 99.2 F (37.3 C) (01/14 0820) Temp Source: Axillary (01/14 0820) BP: 116/47 (01/14 1121) Pulse Rate: 82 (01/14 1121)  Labs: Recent Labs    03/17/21 0328 03/17/21 1701 03/18/21 0325  HGB 13.6   13.2 11.9* 11.3*  HCT 39.7   40.3 36.6* 34.9*  PLT 455*   442* 412* 421*  CREATININE 9.84* 10.14* 10.40*    Estimated Creatinine Clearance: 4.8 mL/min (A) (by C-G formula based on SCr of 10.4 mg/dL (H)).   Medical History: Past Medical History:  Diagnosis Date   Anemia    low iron   Aortic stenosis    s/p TAVR 01/19/20   Arthritis    Cancer ([place])    prostate, s/p I-125 seed implant 05/18/05   Colon polyps ~ 1993 and 2003   Dr [name], [facility].  08/2001 colonoscopy: tubular adenoma at cecum.     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 male with new onset atrial fibrillation in the setting of acute cholecystitis. Patient was not on anticoagulation prior to admission. CBC is stable.   Goal of Therapy:  Heparin level 0.3-0.7 units/ml Monitor platelets by anticoagulation protocol: Yes   Plan:  Start heparin infusion at 1000 units/hr Check anti-Xa level in 8 hours and daily while on heparin Continue to monitor H&H and platelets  Joseph Art, Pharm.D. PGY-1 Pharmacy  Resident [phone] 03/18/2021 12:23 PM

## 2021-03-18 NOTE — Progress Notes (Signed)
Mr. Vickerman was pulling on the chole drain. Fortunately sutures are still in place. MD notified. Mitten were placed on pt hands to prevent pulling on the drain and or removing telemetry leads.

## 2021-03-18 NOTE — Progress Notes (Signed)
[facility] Progress Note  Subjective: seen in room, blind, no c/o's, more groggy per the family  Vitals:   03/18/21 1415 03/18/21 1430 03/18/21 1445 03/18/21 1500  BP: 119/72 (!) 160/89 108/76 (!) 90/51  Pulse: (!) 106 (!) 105 (!) 113   Resp: 20 17 (!) 25 19  Temp:      TempSrc:      SpO2:      Weight:      Height:        Exam: Gen no distress, blind, groggy today No jvd Chest clear bilat to bases Irreg R/R, no RG Abd soft ntnd no mass or ascites +bs MS L BKA Ext no LE or UE edema Neuro as above, nf  L AVF +t/b     OP HD: [place] MWF  4h  400/600  66kg 2/2 bath  P2  AVF  Hep none  - no esa/ Fe  - sensipar 60 mg po tiw  - hectorol 3 ug tiw IV   Assessment/ Plan: Acute Cholelithiasis: Per primary. SP perc chole tube by IR 1/12. Has been started on Zosyn. Atrial fib w/ RVR - new, seen by cardiology.  IV amio gtt started.  Borderline hypotension - may be a bit dry Hyperkalemia: K+ 5's today. Low K+ bath w/ HD.   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 HD got bumped til today. On HD now.    BP/volume  - Soft BP's.  Carvedilol 3.125 mg PO BID on OP med list, not ordered here.   Anemia  - HGB 15, No ESA.   Metabolic bone disease -  OP labs at goal 03/13/2021. On Auryxia binders as OP. HGB high. Corrected Ca 10.3. Change to Renvela binders. Lower VDRA dose. Continue sensipar.   Nutrition - Albumin 2.6. Protein supps ordered. Renal diet.  Parkinson's disease-per primary 10. DMT2-per primary 11. H/O TAVR 4. DNR     Kelly Splinter 03/18/2021, 3:33 PM   Recent Labs  Lab 03/16/21 2200 03/16/21 2302 03/17/21 1701 03/18/21 0325  K 6.1*   < > 5.6* 5.5*  BUN 59*   < > 73* 76*  CREATININE 9.69*   < > 10.14* 10.40*  CALCIUM 9.2   < > 8.5* 9.0  PHOS 6.6*  --  6.7*  --   HGB  --    < > 11.9* 11.3*   < > = values in this interval not displayed.    Inpatient medications:  (feeding supplement) PROSource Plus  30 mL Oral BID BM   brimonidine  1 drop Right Eye TID    carbidopa-levodopa  1 tablet Oral TID   Chlorhexidine Gluconate Cloth  6 each Topical Q0600   cinacalcet  60 mg Oral Q M,W,F-HD   doxercalciferol  2 mcg Intravenous Q M,W,F-HD   insulin aspart  0-6 Units Subcutaneous Q4H   latanoprost  1 drop Right Eye QHS   pantoprazole (PROTONIX) IV  40 mg Intravenous Q24H   pilocarpine  1 drop Right Eye QID   sevelamer carbonate  1,600 mg Oral TID WC    sodium chloride     sodium chloride     sodium chloride 50 mL/hr at 03/17/21 1638   amiodarone 30 mg/hr (03/18/21 1032)   heparin 1,000 Units/hr (03/18/21 1405)   piperacillin-tazobactam (ZOSYN)  IV 2.25 g (03/18/21 1403)   sodium chloride, sodium chloride, acetaminophen, HYDROmorphone (DILAUDID) injection, ipratropium-albuterol, lidocaine (PF), lidocaine-prilocaine, ondansetron **OR** ondansetron (ZOFRAN) IV, pentafluoroprop-tetrafluoroeth

## 2021-03-18 NOTE — Progress Notes (Signed)
ANTICOAGULATION CONSULT NOTE  Pharmacy Consult for heparin Indication: atrial fibrillation  No Known Allergies  Patient Measurements: Height: 5\' 5"  (165.1 cm) Weight: 61.8 kg (136 lb 3.9 oz) IBW/kg (Calculated) : 61.5 Heparin Dosing Weight: 65 kg   Vital Signs: Temp: 98 F (36.7 C) (01/14 1635) Temp Source: Oral (01/14 1635) BP: 111/73 (01/14 1641) Pulse Rate: 91 (01/14 1635)  Labs: Recent Labs    03/17/21 0328 03/17/21 1701 03/18/21 0325 03/18/21 2208  HGB 13.6   13.2 11.9* 11.3*  --   HCT 39.7   40.3 36.6* 34.9*  --   PLT 455*   442* 412* 421*  --   HEPARINUNFRC  --   --   --  0.41  CREATININE 9.84* 10.14* 10.40*  --      Estimated Creatinine Clearance: 4.8 mL/min (A) (by C-G formula based on SCr of 10.4 mg/dL (H)).   Medical History: Past Medical History:  Diagnosis Date   Anemia    low iron   Aortic stenosis    s/p TAVR 01/19/20   Arthritis    Cancer ([place])    prostate, s/p I-125 seed implant 05/18/05   Colon polyps ~ 1993 and 2003   Dr [name], [facility].  08/2001 colonoscopy: tubular adenoma at cecum.     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 male with new onset atrial fibrillation in the setting of acute cholecystitis. Patient was not on anticoagulation prior to admission. CBC is stable.   Initial heparin level therapeutic.  Goal of Therapy:  Heparin level 0.3-0.7 units/ml Monitor platelets by anticoagulation protocol: Yes   Plan:  Continue heparin 1000 units/h Daily heparin level and CBC  Arrie Senate, PharmD, [place], [facility] Clinical  Pharmacist [phone] Please check AMION for all [facility] numbers 03/18/2021

## 2021-03-19 ENCOUNTER — Inpatient Hospital Stay: Payer: Self-pay

## 2021-03-19 LAB — URINE CULTURE: Culture: NO GROWTH

## 2021-03-19 LAB — CBC
HCT: 32.9 % — ABNORMAL LOW (ref 39.0–52.0)
Hemoglobin: 10.9 g/dL — ABNORMAL LOW (ref 13.0–17.0)
MCH: 34.4 pg — ABNORMAL HIGH (ref 26.0–34.0)
MCHC: 33.1 g/dL (ref 30.0–36.0)
MCV: 103.8 fL — ABNORMAL HIGH (ref 80.0–100.0)
Platelets: 321 10*3/uL (ref 150–400)
RBC: 3.17 MIL/uL — ABNORMAL LOW (ref 4.22–5.81)
RDW: 14.3 % (ref 11.5–15.5)
WBC: 18.2 10*3/uL — ABNORMAL HIGH (ref 4.0–10.5)
nRBC: 0 % (ref 0.0–0.2)

## 2021-03-19 LAB — BODY FLUID CULTURE W GRAM STAIN
Culture: NO GROWTH
Gram Stain: NONE SEEN

## 2021-03-19 LAB — GLUCOSE, CAPILLARY
Glucose-Capillary: 84 mg/dL (ref 70–99)
Glucose-Capillary: 71 mg/dL (ref 70–99)
Glucose-Capillary: 181 mg/dL — ABNORMAL HIGH (ref 70–99)
Glucose-Capillary: 179 mg/dL — ABNORMAL HIGH (ref 70–99)
Glucose-Capillary: 161 mg/dL — ABNORMAL HIGH (ref 70–99)

## 2021-03-19 MED ORDER — ACETAMINOPHEN 325 MG PO TABS
650.0000 mg | ORAL_TABLET | Freq: Four times a day (QID) | ORAL | Status: DC | PRN
  Administered 2021-03-20 – 2021-03-26 (×8): 650 mg via ORAL
  Filled 2021-03-19 (×10): qty 2

## 2021-03-19 MED ORDER — SODIUM CHLORIDE 0.9% FLUSH
10.0000 mL | Freq: Two times a day (BID) | INTRAVENOUS | Status: DC
  Administered 2021-03-19 – 2021-03-21 (×5): 10 mL
  Administered 2021-03-21: 20 mL
  Administered 2021-03-22 – 2021-03-25 (×6): 10 mL

## 2021-03-19 MED ORDER — APIXABAN 2.5 MG PO TABS
2.5000 mg | ORAL_TABLET | Freq: Two times a day (BID) | ORAL | Status: DC
  Administered 2021-03-19 – 2021-03-23 (×9): 2.5 mg via ORAL
  Filled 2021-03-19 (×9): qty 1

## 2021-03-19 MED ORDER — SODIUM CHLORIDE 0.9% FLUSH: 10.0000 mL | INTRAVENOUS | Status: DC | PRN

## 2021-03-19 NOTE — Progress Notes (Signed)
Peripherally Inserted Central Catheter Placement  The IV Nurse has discussed with the patient and/or persons authorized to consent for the patient, the purpose of this procedure and the potential benefits and risks involved with this procedure.  The benefits include less needle sticks, lab draws from the catheter, and the patient may be discharged home with the catheter. Risks include, but not limited to, infection, bleeding, blood clot (thrombus formation), and puncture of an artery; nerve damage and irregular heartbeat and possibility to perform a PICC exchange if needed/ordered by physician.  Alternatives to this procedure were also discussed.  Bard Power PICC patient education guide, fact sheet on infection prevention and patient information card has been provided to patient /or left at bedside.  Telephone consent obtained from dtr due to intermittent confusion.  Pt agreeable at bedside.  PICC Placement Documentation  PICC Double Lumen 03/19/21 PICC Right Brachial 38 cm 0 cm (Active)  Indication for Insertion or Continuance of Line Vasoactive infusions;Poor Vasculature-patient has had multiple peripheral attempts or PIVs lasting less than 24 hours;Limited venous access - need for IV therapy >5 days (PICC only);Prolonged intravenous therapies 03/19/21 1239  Exposed Catheter (cm) 0 cm 03/19/21 1239  Site Assessment Clean;Dry;Intact 03/19/21 1239  Lumen #1 Status Flushed;Saline locked;Blood return noted 03/19/21 1239  Lumen #2 Status Flushed;Saline locked;Blood return noted 03/19/21 1239  Dressing Type Transparent 03/19/21 1239  Dressing Status Clean;Dry;Intact 03/19/21 1239  Antimicrobial disc in place? Yes 03/19/21 1239  Safety Lock Not Applicable 12/45/80 9983  Line Care Connections checked and tightened 03/19/21 1239  Line Adjustment (NICU/IV Team Only) No 03/19/21 1239  Dressing Intervention New dressing 03/19/21 1239  Dressing Change Due 03/26/21 03/19/21 1239       Rolena Infante 03/19/2021, 12:40 PM

## 2021-03-19 NOTE — Progress Notes (Signed)
Interventional Radiology Brief Note:  IR aware of request for central line placement.  Unfortunately unable to accommodate in IR today but will reassess patient for line placement if still needed 03/20/21. RN aware.   Brynda Greathouse, MS RD PA-C 9:19 AM

## 2021-03-19 NOTE — Progress Notes (Signed)
°  Subjective: Patient seems to be doing better today. No acute complaints at time of my exam  Patient had around 50 cc of purulent appearing urine during day shift  Objective: Vital signs in last 24 hours: Temp:  [97.7 F (36.5 C)-97.9 F (36.6 C)] 97.7 F (36.5 C) (01/15 1603) Pulse Rate:  [76-103] 76 (01/15 1603) Resp:  [17] 17 (01/15 1603) BP: (125-138)/(56-59) 138/59 (01/15 1603) SpO2:  [98 %-100 %] 98 % (01/15 1603)  Intake/Output from previous day: 01/14 0701 - 01/15 0700 In: 293.1 [P.O.:120; I.V.:113.1; IV Piggyback:50] Out: 550 [Urine:500; Drains:50] Intake/Output this shift: No intake/output data recorded.  Physical Exam:  General: Alert and oriented CV: RRR Lungs: Clear Abdomen: Soft, ND, ATTP Ext: NT, No erythema Foley in place draining purulent urine  Lab Results: Recent Labs    03/17/21 1701 03/18/21 0325 03/19/21 0255  HGB 11.9* 11.3* 10.9*  HCT 36.6* 34.9* 32.9*   BMET Recent Labs    03/17/21 1701 03/18/21 0325  NA 137 138  K 5.6* 5.5*  CL 93* 95*  CO2 23 21*  GLUCOSE 130* 118*  BUN 73* 76*  CREATININE 10.14* 10.40*  CALCIUM 8.5* 9.0     Studies/Results: [place] EKG SITE RITE  Result Date: 03/19/2021 If Site Rite image not attached, placement could not be confirmed due to current cardiac rhythm.   Assessment/Plan: 81 yo M with chronic bilateral hydronephrosis and pyocystis   -continue foley -urine culture from admit negative; I am somewhat doubtful as to this result given the gross appearance of his urine and his urinalysis findings   LOS: 4 days   Donald Pore MD 03/19/2021, 7:21 PM [facility]  Pager: [phone]

## 2021-03-19 NOTE — Progress Notes (Addendum)
Subjective: Seen in room, co mild discomfort Chole tube, asking for assistance to eat lunch, blind , staff alerted to this  Objective Vital signs in last 24 hours: Vitals:   03/18/21 1554 03/18/21 1635 03/18/21 1641 03/18/21 2324  BP: 124/70  111/73 (!) 125/56  Pulse: 98 91  (!) 103  Resp: (!) 24 17 20 17   Temp: (!) 97.5 F (36.4 C) 98 F (36.7 C)  97.9 F (36.6 C)  TempSrc: Temporal Oral  Axillary  SpO2: 99% 99%  100%  Weight: 61.8 kg     Height:       Weight change:   Physical Exam: General: Alert Elderly male chronically ill-appearing, NAD pleasant Heart: RRR rate stable, no MRG Lungs: CTA nonlabored breathing Abdomen: NABS, S, NT ND,right-sided perc chole tube and amount of dark brownish bilious fluid Extremities: No pedal edema  dialysis Access: Left arm aVF positive bruit  OP HD: [place] MWF  4h  400/600  66kg 2/2 bath  P2  AVF  Hep none  - no esa/ Fe  - sensipar 60 mg po tiw  - hectorol 3 ug tiw IV     Problem/Plan: Acute Cholelithiasis: Per primary. SP perc chole tube by IR 1/12. Has been started on Zosyn. Atrial fib w/ RVR - new, converted to sinus 01/14 seen by cardiology.  IV amio gtt started.  Now p.o. load, heparin drip, and convert to [facility] prior to discharge  Borderline hypotension - may be a bit dry had no UF with dialysis 1/14 Hyperkalemia: K+ 5  .5 . Low K+ bath w/ HD.   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 HD got bumped til 1/14 Next dialysis tomorrow Monday 1/16 on schedule     BP/volume  - Soft BP's.  Holding home meds no HD as above on 1/14 and tomorrow HD  Anemia  - HGB 10 on admit then 11.3 and 10.9 this a.m. follow-up trend currently no ESA.   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 Change to Renvela binders. Lower VDRA dose. Continue sensipar.  Follow-up trend  Nutrition - Albumin 2.0 Protein supps ordered. Renal diet.  Parkinson's disease-per primary DMT2-per primary  H/O TAVR DNR   Ernest Haber, PA-C [facility]  [phone] 03/19/2021,2:15 PM  LOS: 4 days   Labs: Basic Metabolic Panel: Recent Labs  Lab 03/16/21 2200 03/17/21 0328 03/17/21 1701 03/18/21 0325  NA 139 137 137 138  K 6.1* 6.0* 5.6* 5.5*  CL 88* 91* 93* 95*  CO2 28 27 23  21*  GLUCOSE 170* 140* 130* 118*  BUN 59* 64* 73* 76*  CREATININE 9.69* 9.84* 10.14* 10.40*  CALCIUM 9.2 8.6* 8.5* 9.0  PHOS 6.6*  --  6.7*  --    Liver Function Tests: Recent Labs  Lab 03/16/21 0545 03/16/21 2200 03/17/21 0328 03/17/21 1701 03/18/21 0325  AST 48*  --  57*  --  30  ALT 14  --  15  --  7  ALKPHOS 98  --  106  --  79  BILITOT 1.0  --  1.4*  --  1.0  PROT 8.5*  --  7.1  --  6.3*  ALBUMIN 2.6*   < > 2.4* 2.0* 2.0*   < > = values in this interval not displayed.   Recent Labs  Lab 03/15/21 0855  LIPASE 20   No results for input(s): AMMONIA in the last 168 hours. CBC: Recent Labs  Lab 03/15/21 0855 03/16/21 0251 03/16/21 2302 03/17/21  8546 03/17/21 1701 03/18/21 0325 03/19/21 0255  WBC 19.1*   < > 44.3* 44.0*   42.7* 31.3* 25.8* 18.2*  NEUTROABS 16.9*  --   --  40.8*  --   --   --   HGB 13.8   < > 14.1 13.6   13.2 11.9* 11.3* 10.9*  HCT 42.1   < > 41.8 39.7   40.3 36.6* 34.9* 32.9*  MCV 106.3*   < > 104.0* 105.0*   105.5* 104.9* 105.1* 103.8*  PLT 414*   < > 491* 455*   442* 412* 421* 321   < > = values in this interval not displayed.   Cardiac Enzymes: No results for input(s): CKTOTAL, CKMB, CKMBINDEX, TROPONINI in the last 168 hours. CBG: Recent Labs  Lab 03/18/21 1634 03/18/21 2246 03/19/21 0641 03/19/21 0806 03/19/21 1300  GLUCAP 94 102* 84 71 181*    Studies/Results: ECHOCARDIOGRAM LIMITED  Result Date: 03/17/2021    ECHOCARDIOGRAM LIMITED REPORT   Patient Name:   Elijah White Date of Exam: 03/17/2021 Medical Rec #:  [id number]       Height:       65.0 in Accession #:    [id number]      Weight:       143.3 lb Date of Birth:  1940-09-23       BSA:          1.717 m Patient Age:    81 years        BP:            116/50 mmHg Patient Gender: M               HR:           114 bpm. Exam Location:  Inpatient Procedure: Cardiac Doppler, Color Doppler and Limited Echo                         STAT ECHO Reported to: Dr Gwyndolyn Kaufman on 03/17/2021 5:50:00 PM. Indications:    Hypotension  History:        Patient has no prior history of Echocardiogram examinations,                 most recent 01/05/2021.                 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  Sonographer:    Clayton Lefort RDCS (AE) Referring Phys: [id number] Darreld Mclean  Sonographer Comments: Image acquisition challenging due to respiratory motion. IMPRESSIONS  1. Left ventricular ejection fraction, by estimation, is 60 to 65%. The left ventricle has normal function. There is moderate concentric left ventricular hypertrophy.  2. Right ventricular systolic function is normal. The right ventricular size is normal.  3. The mitral valve is degenerative. Severe mitral annular calcification.  4. 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 Echo findings are consistent with normal structure and function of the aortic valve prosthesis. Aortic valve mean gradient measures 5.0 mmHg. Aortic valve Vmax measures 1.40 m/s. DI 0.8. There is no paravalvular leak.  5. The inferior vena cava is normal in size with <50% respiratory variability, suggesting right atrial pressure of 8 mmHg. Comparison(s): Compared to prior TTE on 01/2021, there is no significant  change. TAVR valve continues to be well seated with current gradient 56mmHg (previously 53mmHg). FINDINGS  Left Ventricle: Left ventricular ejection fraction, by estimation, is 60 to 65%. The left ventricle has normal function. The left ventricular internal cavity size was normal in size. There is moderate concentric left ventricular hypertrophy. Right Ventricle: The right  ventricular size is normal. Right ventricular systolic function is normal. Pericardium: There is no evidence of pericardial effusion. Mitral Valve: The mitral valve is degenerative in appearance. There is moderate thickening of the mitral valve leaflet(s). There is moderate calcification of the mitral valve leaflet(s). Severe mitral annular calcification. Tricuspid Valve: The tricuspid valve is normal in structure. Tricuspid valve regurgitation is trivial. Aortic Valve: DI 0.8. No PVL. The aortic valve has been repaired/replaced. Aortic valve mean gradient measures 5.0 mmHg. Aortic valve peak gradient measures 7.8 mmHg. Aortic valve area, by VTI measures 2.67 cm. 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 Echo findings are consistent with normal structure and function of the aortic valve prosthesis. Pulmonic Valve: The pulmonic valve was not well visualized. Pulmonic valve regurgitation is trivial. Aorta: The aortic root and ascending aorta are structurally normal, with no evidence of dilitation. Venous: The inferior vena cava is normal in size with less than 50% respiratory variability, suggesting right atrial pressure of 8 mmHg. LEFT VENTRICLE PLAX 2D LVIDd:         3.90 cm LVIDs:         3.00 cm LV PW:         1.30 cm LV IVS:        1.20 cm LVOT diam:     2.00 cm LV SV:         61 LV SV Index:   36 LVOT Area:     3.14 cm  LEFT ATRIUM           Index LA diam:      3.40 cm 1.98 cm/m LA Vol (A4C): 20.0 ml 11.65 ml/m  AORTIC VALVE AV Area (Vmax):    2.51 cm AV Area (Vmean):   2.53 cm AV Area (VTI):     2.67 cm AV Vmax:           140.00 cm/s AV Vmean:          104.000 cm/s AV VTI:            0.228 m AV Peak Grad:      7.8 mmHg AV Mean Grad:      5.0 mmHg LVOT Vmax:         112.00 cm/s LVOT Vmean:        83.900 cm/s LVOT VTI:          0.194 m LVOT/AV VTI ratio: 0.85  AORTA Ao Root diam: 2.50 cm Ao Asc diam:  2.60 cm  SHUNTS Systemic VTI:  0.19 m Systemic Diam: 2.00  cm Gwyndolyn Kaufman MD Electronically signed by Gwyndolyn Kaufman MD Signature Date/Time: 03/17/2021/6:28:04 PM    Final    [place] EKG SITE RITE  Result Date: 03/19/2021 If Site Rite image not attached, placement could not be confirmed due to current cardiac rhythm.  Medications:  sodium chloride     sodium chloride     sodium chloride 50 mL/hr at 03/18/21 2238   piperacillin-tazobactam (ZOSYN)  IV 2.25 g (03/18/21 2239)    (feeding supplement) PROSource Plus  30 mL Oral BID BM   amiodarone  400 mg Oral BID  apixaban  2.5 mg Oral BID   brimonidine  1 drop Right Eye TID   carbidopa-levodopa  1 tablet Oral TID   Chlorhexidine Gluconate Cloth  6 each Topical Q0600   cinacalcet  60 mg Oral Q M,W,F-HD   doxercalciferol  2 mcg Intravenous Q M,W,F-HD   insulin aspart  0-6 Units Subcutaneous Q4H   latanoprost  1 drop Right Eye QHS   pantoprazole (PROTONIX) IV  40 mg Intravenous Q24H   pilocarpine  1 drop Right Eye QID   sevelamer carbonate  1,600 mg Oral TID WC   sodium chloride flush  10-40 mL Intracatheter Q12H

## 2021-03-19 NOTE — Progress Notes (Signed)
Patient ID: Elijah White, male   DOB: November 21, 1940, 81 y.o.   MRN: [id number] [facility] Surgery Progress Note     Subjective: CC-  Abdominal pain much improved. No complaints this morning. Eating breakfast.   Objective: Vital signs in last 24 hours: Temp:  [97.5 F (36.4 C)-98 F (36.7 C)] 97.9 F (36.6 C) (01/14 2324) Pulse Rate:  [73-113] 103 (01/14 2324) Resp:  [16-25] 17 (01/14 2324) BP: (76-160)/(47-129) 125/56 (01/14 2324) SpO2:  [97 %-100 %] 100 % (01/14 2324) Weight:  [61.8 kg] 61.8 kg (01/14 1554) Last BM Date: 03/18/21  Intake/Output from previous day: 01/14 0701 - 01/15 0700 In: 293.1 [P.O.:120; I.V.:113.1; IV Piggyback:50] Out: 550 [Urine:500; Drains:50] Intake/Output this shift: No intake/output data recorded.  PE: Gen:  obvious distress secondary to pain Card:  regular but tachy in 110s Pulm:  overall CTAB Abd: soft, mild tenderness - much improved. Nondistended.  Perc chole drain with turbid brownish bilious fluid. Umbilical hernia soft without overlying skin changes. Psych: unable to really answer many questions secondary to pain, difficult to tell if he is altered   Lab Results:  Recent Labs    03/18/21 0325 03/19/21 0255  WBC 25.8* 18.2*  HGB 11.3* 10.9*  HCT 34.9* 32.9*  PLT 421* 321   BMET Recent Labs    03/17/21 1701 03/18/21 0325  NA 137 138  K 5.6* 5.5*  CL 93* 95*  CO2 23 21*  GLUCOSE 130* 118*  BUN 73* 76*  CREATININE 10.14* 10.40*  CALCIUM 8.5* 9.0   PT/INR No results for input(s): LABPROT, INR in the last 72 hours. CMP     Component Value Date/Time   NA 138 03/18/2021 0325   NA 134 (A) 09/11/2019 0000   K 5.5 (H) 03/18/2021 0325   CL 95 (L) 03/18/2021 0325   CO2 21 (L) 03/18/2021 0325   GLUCOSE 118 (H) 03/18/2021 0325   BUN 76 (H) 03/18/2021 0325   BUN 34 (A) 09/11/2019 0000   CREATININE 10.40 (H) 03/18/2021 0325   CALCIUM 9.0 03/18/2021 0325   PROT 6.3 (L) 03/18/2021 0325   PROT 6.9 02/19/2018 1014    ALBUMIN 2.0 (L) 03/18/2021 0325   ALBUMIN 4.2 02/19/2018 1014   AST 30 03/18/2021 0325   ALT 7 03/18/2021 0325   ALKPHOS 79 03/18/2021 0325   BILITOT 1.0 03/18/2021 0325   BILITOT 0.5 02/19/2018 1014   GFRNONAA 5 (L) 03/18/2021 0325   GFRAA 7 (L) 12/03/2019 0710   Lipase     Component Value Date/Time   LIPASE 20 03/15/2021 0855       Studies/Results: CT ABDOMEN PELVIS W CONTRAST  Result Date: 03/17/2021 CLINICAL DATA:  Acute generalized abdominal pain. EXAM: CT ABDOMEN AND PELVIS WITH CONTRAST TECHNIQUE: Multidetector CT imaging of the abdomen and pelvis was performed using the standard protocol following bolus administration of intravenous contrast. RADIATION DOSE REDUCTION: This exam was performed according to the departmental dose-optimization program which includes automated exposure control, adjustment of the mA and/or kV according to patient size and/or use of iterative reconstruction technique. CONTRAST:  1mL OMNIPAQUE IOHEXOL 300 MG/ML  SOLN COMPARISON:  March 15, 2021. FINDINGS: Lower chest: Right lower lobe pneumonia or atelectasis is noted. Hepatobiliary: Interval placement of percutaneous cholecystostomy tube is noted. Continued gallbladder distention and cholelithiasis is noted. No biliary dilatation is noted. The liver is unremarkable. Pancreas: Unremarkable. No pancreatic ductal dilatation or surrounding inflammatory changes. Spleen: Normal in size without focal abnormality. Adrenals/Urinary Tract: Stable 2.6 cm left adrenal nodule  is noted. Stable right adrenal gland enlargement is noted. Mild bilateral renal atrophy is noted. No nephrolithiasis is noted. Moderate right hydroureteronephrosis is noted without obstructing calculus. Stable dilatation of left renal pelvis is noted without calyceal dilatation. Mild left ureteral dilatation is noted. No obstructing calculus is noted. Mild urinary bladder distention is noted. Stomach/Bowel: Stomach is within normal limits. Appendix  appears normal. No evidence of bowel wall thickening, distention, or inflammatory changes. Vascular/Lymphatic: Aortic atherosclerosis. No enlarged abdominal or pelvic lymph nodes. Reproductive: Status post prostatic brachytherapy seed placement. Other: No abdominal wall hernia or abnormality. No abdominopelvic ascites. Musculoskeletal: No acute or significant osseous findings. IMPRESSION: Interval development of right lower lobe opacity is noted concerning for pneumonia or atelectasis. Interval placement of percutaneous cholecystostomy tube in grossly good position. Stable gallbladder distension and cholelithiasis is noted. Grossly stable moderate right hydroureteronephrosis is noted as well as prominent left renal pelvis and mild distal left ureteral dilatation, without evidence of obstructing calculus. Mild urinary bladder distention is noted. 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 Aortic Atherosclerosis (ICD10-I70.0). Electronically Signed   By: Marijo Conception M.D.   On: 03/17/2021 09:41   ECHOCARDIOGRAM LIMITED  Result Date: 03/17/2021    ECHOCARDIOGRAM LIMITED REPORT   Patient Name:   Elijah White Date of Exam: 03/17/2021 Medical Rec #:  [id number]       Height:       65.0 in Accession #:    [id number]      Weight:       143.3 lb Date of Birth:  07/28/1940       BSA:          1.717 m Patient Age:    79 years        BP:           116/50 mmHg Patient Gender: M               HR:           114 bpm. Exam Location:  Inpatient Procedure: Cardiac Doppler, Color Doppler and Limited Echo                         STAT ECHO Reported to: Dr Gwyndolyn Kaufman on 03/17/2021 5:50:00 PM. Indications:    Hypotension  History:        Patient has no prior history of Echocardiogram examinations,                 most recent 01/05/2021.                 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  Sonographer:     Clayton Lefort RDCS (AE) Referring Phys: [id number] Darreld Mclean  Sonographer Comments: Image acquisition challenging due to respiratory motion. IMPRESSIONS  1. Left ventricular ejection fraction, by estimation, is 60 to 65%. The left ventricle has normal function. There is moderate concentric left ventricular hypertrophy.  2. Right ventricular systolic function is normal. The right ventricular size is normal.  3. The mitral valve is degenerative. Severe mitral annular calcification.  4. 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 Echo findings are consistent with normal structure and function of the aortic valve prosthesis. Aortic valve mean gradient  measures 5.0 mmHg. Aortic valve Vmax measures 1.40 m/s. DI 0.8. There is no paravalvular leak.  5. The inferior vena cava is normal in size with <50% respiratory variability, suggesting right atrial pressure of 8 mmHg. Comparison(s): Compared to prior TTE on 01/2021, there is no significant change. TAVR valve continues to be well seated with current gradient 36mmHg (previously 24mmHg). FINDINGS  Left Ventricle: Left ventricular ejection fraction, by estimation, is 60 to 65%. The left ventricle has normal function. The left ventricular internal cavity size was normal in size. There is moderate concentric left ventricular hypertrophy. Right Ventricle: The right ventricular size is normal. Right ventricular systolic function is normal. Pericardium: There is no evidence of pericardial effusion. Mitral Valve: The mitral valve is degenerative in appearance. There is moderate thickening of the mitral valve leaflet(s). There is moderate calcification of the mitral valve leaflet(s). Severe mitral annular calcification. Tricuspid Valve: The tricuspid valve is normal in structure. Tricuspid valve regurgitation is trivial. Aortic Valve: DI 0.8. No PVL. The aortic valve has been repaired/replaced.  Aortic valve mean gradient measures 5.0 mmHg. Aortic valve peak gradient measures 7.8 mmHg. Aortic valve area, by VTI measures 2.67 cm. 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 Echo findings are consistent with normal structure and function of the aortic valve prosthesis. Pulmonic Valve: The pulmonic valve was not well visualized. Pulmonic valve regurgitation is trivial. Aorta: The aortic root and ascending aorta are structurally normal, with no evidence of dilitation. Venous: The inferior vena cava is normal in size with less than 50% respiratory variability, suggesting right atrial pressure of 8 mmHg. LEFT VENTRICLE PLAX 2D LVIDd:         3.90 cm LVIDs:         3.00 cm LV PW:         1.30 cm LV IVS:        1.20 cm LVOT diam:     2.00 cm LV SV:         61 LV SV Index:   36 LVOT Area:     3.14 cm  LEFT ATRIUM           Index LA diam:      3.40 cm 1.98 cm/m LA Vol (A4C): 20.0 ml 11.65 ml/m  AORTIC VALVE AV Area (Vmax):    2.51 cm AV Area (Vmean):   2.53 cm AV Area (VTI):     2.67 cm AV Vmax:           140.00 cm/s AV Vmean:          104.000 cm/s AV VTI:            0.228 m AV Peak Grad:      7.8 mmHg AV Mean Grad:      5.0 mmHg LVOT Vmax:         112.00 cm/s LVOT Vmean:        83.900 cm/s LVOT VTI:          0.194 m LVOT/AV VTI ratio: 0.85  AORTA Ao Root diam: 2.50 cm Ao Asc diam:  2.60 cm  SHUNTS Systemic VTI:  0.19 m Systemic Diam: 2.00 cm Gwyndolyn Kaufman MD Electronically signed by Gwyndolyn Kaufman MD Signature Date/Time: 03/17/2021/6:28:04 PM    Final     Anti-infectives: Anti-infectives (From admission, onward)    Start     Dose/Rate Route Frequency Ordered Stop   03/15/21 1400  piperacillin-tazobactam (ZOSYN) IVPB 2.25 g  2.25 g 100 mL/hr over 30 Minutes Intravenous Every 8 hours 03/15/21 1326     03/15/21 1330  Ampicillin-Sulbactam (UNASYN) 3 g in sodium chloride 0.9 % 100 mL IVPB  Status:  Discontinued        3 g 200 mL/hr over 30 Minutes  Intravenous Every evening 03/15/21 1324 03/15/21 1326        Assessment/Plan Acute cholecystitis, diffuse abdominal pain - s/p perc chole drain, but this has not helped his pain at all -WBC trending down significantly so  -PCXR this am overall looks good and would not explain WBC.  No air under his diaphragm though noted. -CT AP 1/13 - possible pna, drain in good position -diet as per primary -cont zosyn -vitals, labs, imaging, I/0s reviewed over last 24 hrs.  ID - Zosyn VTE - SCDs; ok for plavix/chemical dvt ppx from our perspective FEN - Diet as tolerated Foley - None   HTN HLD ESRD on HD (M/W/F) DM2 Aortic Stenosis s/p TAVR on Plavix (last dose 1/10 in AM) L BKA CHF (EF 65-70% on ECHO 01/05/21) Parkinson's Remote hx of prostate CA who presented for abdominal pain.    High Medical Decision Making    LOS: 4 days

## 2021-03-19 NOTE — Progress Notes (Signed)
[place] for apixaban dosing Indication: atrial fibrillation  No Known Allergies  Patient Measurements: Height: 5\' 5"  (165.1 cm) Weight: 61.8 kg (136 lb 3.9 oz) IBW/kg (Calculated) : 61.5 Heparin Dosing Weight: 65 kg   Vital Signs: Temp: 97.9 F (36.6 C) (01/14 2324) Temp Source: Axillary (01/14 2324) BP: 125/56 (01/14 2324) Pulse Rate: 103 (01/14 2324)  Labs: Recent Labs    03/17/21 0328 03/17/21 1701 03/18/21 0325 03/18/21 2208 03/19/21 0255  HGB 13.6   13.2 11.9* 11.3*  --  10.9*  HCT 39.7   40.3 36.6* 34.9*  --  32.9*  PLT 455*   442* 412* 421*  --  321  HEPARINUNFRC  --   --   --  0.41  --   CREATININE 9.84* 10.14* 10.40*  --   --      Estimated Creatinine Clearance: 4.8 mL/min (A) (by C-G formula based on SCr of 10.4 mg/dL (H)).   Medical History: Past Medical History:  Diagnosis Date   Anemia    low iron   Aortic stenosis    s/p TAVR 01/19/20   Arthritis    Cancer ([place])    prostate, s/p I-125 seed implant 05/18/05   Colon polyps ~ 1993 and 2003   Dr [name], [facility].  08/2001 colonoscopy: tubular adenoma at cecum.     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   Elevated cholesterol with high triglycerides    ESRD (end stage renal disease) (HCC)    [facility]   GERD (gastroesophageal reflux disease)    Glaucoma    Hypertension    Legally blind in left eye, as defined in [place]    has pinpoint vision in right eye   PAD (peripheral artery disease) ([place])    RBBB     Assessment: Elijah White is an 81 y.o. male with new onset atrial fibrillation in the setting of acute cholecystitis. Patient was not on anticoagulation prior to admission. CBC is stable.   Heparin gtt has been complicated by loss of IV access. IV team has attempted IV access 4 times without success. Patient then refused any further attempts at IV access. Surgery notes that patient is ok for  plavix/chemical dvt ppx from their perspective. Will transition patient from heparin gtt to Eliquis for atrial fibrillation.  Patient is >80 YO, SCr >1.5, and weight is 61.8 kg. A dose reduction is indicated.   Goal of Therapy:  Heparin level 0.3-0.7 units/ml Monitor platelets by anticoagulation protocol: Yes   Plan:  DC heparin gtt given inability to obtain IV access Start Eliquis 2.5 mg BID Monitor CBC and s/sx of bleeding  Joseph Art, Pharm.D. PGY-1 Pharmacy Resident [phone] 03/19/2021 9:38 AM

## 2021-03-19 NOTE — Progress Notes (Signed)
Progress Note    Elijah White  KWI:[id number] DOB: Jun 25, 1940  DOA: 03/15/2021 PCP: Maryella Shivers, MD    Brief Narrative:    Medical records reviewed and are as summarized below:  Elijah White is an 81 y.o. male with medical history significant of ESRD on HD MWF, aortic stenosis status post TAVR 2021, nonobstructive CAD, Parkinson's disease, PVD on Plavix, s/p left BKA, HTN, IIDM, HLD,  came with recurrent right upper quadrant abdominal pain.  Thought to have cholecystitis and drain placed by IR.  Stay in hospital complicated by a fib with RVR, continued WBC count elevation and abdominal pain.  Found to have acute urinary retention needing placement of a foley.      Assessment/Plan:   Principal Problem:   Acute cholecystitis   Severe sepsis from pyocystis/acute cholecystitis  -finally starting to improve -continue IV abx  Acute urinary retention/UTI/hydronephrosis -IV abx -foley catheter- if pyocystis persists may need to change out catheter for 3 way to start irrigation -urology consult appreciated  Acute RUQ abdominal pain with leucocytosis -Secondary to cholelithiasis, suspect early acute cholecystitis with clinical Murphy sign positive. -IV abx (zosyn) -GS consult IR consult for percutaneous drain CT scan suggestive of perhaps a Right sided PNA-- on abx already  ESRD on HD -nephrology consulted.  -HD 1/14  A fib with RVR -dig x 1 -amiodarone started -cards consult much appreciated -old EKG reviewed again by cards and DOES NOT APPEAR to be mobitz type 2- more likely sinus with non-conducted PACs -heparin gtt per cards  HTN -holding home meds due to hypotension   Parkinson's disease -continue Sinemet when able to take PO   Diabetic neuropathy -Continue gabapentin when able to take PO  Hyperglycemia -SSI for now   Family Communication/Anticipated D/C date and plan/Code Status   DVT prophylaxis: heparin gtt Code Status: DNR Disposition  Plan: Status is: Inpatient Spoke with son Remains inpatient appropriate because: needs IV abx         Medical Consultants:   [facility]   Subjective:   Asking about when he can go home Per night nurse patient states he was saying he wanted to go to [facility]  Objective:    Vitals:   03/18/21 1554 03/18/21 1635 03/18/21 1641 03/18/21 2324  BP: 124/70  111/73 (!) 125/56  Pulse: 98 91  (!) 103  Resp: (!) 24 17 20 17   Temp: (!) 97.5 F (36.4 C) 98 F (36.7 C)  97.9 F (36.6 C)  TempSrc: Temporal Oral  Axillary  SpO2: 99% 99%  100%  Weight: 61.8 kg     Height:        Intake/Output Summary (Last 24 hours) at 03/19/2021 1209 Last data filed at 03/19/2021 0600 Gross per 24 hour  Intake 293.07 ml  Output 150 ml  Net 143.07 ml   Filed Weights   03/16/21 2045 03/18/21 1240 03/18/21 1554  Weight: 65 kg 61.8 kg 61.8 kg    Exam:   General: Appearance:    Elderly male in no acute distress. Son feeding him breakfast     Lungs:     respirations unlabored  Heart:    Tachycardic.    MS:   Below knee amputation of left lower extremity is noted.   Neurologic:   Much more awake and alert           Data Reviewed:   I have personally reviewed following labs and imaging studies:  Labs: Labs show the following:  Basic Metabolic Panel: Recent Labs  Lab 03/16/21 0545 03/16/21 2200 03/17/21 0328 03/17/21 1701 03/18/21 0325  NA 139 139 137 137 138  K 5.4* 6.1* 6.0* 5.6* 5.5*  CL 85* 88* 91* 93* 95*  CO2 29 28 27 23  21*  GLUCOSE 249* 170* 140* 130* 118*  BUN 41* 59* 64* 73* 76*  CREATININE 8.56* 9.69* 9.84* 10.14* 10.40*  CALCIUM 9.2 9.2 8.6* 8.5* 9.0  PHOS  --  6.6*  --  6.7*  --    GFR Estimated Creatinine Clearance: 4.8 mL/min (A) (by C-G formula based on SCr of 10.4 mg/dL (H)). Liver Function Tests: Recent Labs  Lab 03/15/21 0855 03/16/21 0545 03/16/21 2200 03/17/21 0328 03/17/21 1701 03/18/21 0325  AST 26 48*  --  57*  --  30   ALT 10 14  --  15  --  7  ALKPHOS 92 98  --  106  --  79  BILITOT 1.0 1.0  --  1.4*  --  1.0  PROT 7.4 8.5*  --  7.1  --  6.3*  ALBUMIN 3.2* 2.6* 2.6* 2.4* 2.0* 2.0*   Recent Labs  Lab 03/15/21 0855  LIPASE 20   No results for input(s): AMMONIA in the last 168 hours. Coagulation profile No results for input(s): INR, PROTIME in the last 168 hours.  CBC: Recent Labs  Lab 03/15/21 0855 03/16/21 0251 03/16/21 2302 03/17/21 0328 03/17/21 1701 03/18/21 0325 03/19/21 0255  WBC 19.1*   < > 44.3* 44.0*   42.7* 31.3* 25.8* 18.2*  NEUTROABS 16.9*  --   --  40.8*  --   --   --   HGB 13.8   < > 14.1 13.6   13.2 11.9* 11.3* 10.9*  HCT 42.1   < > 41.8 39.7   40.3 36.6* 34.9* 32.9*  MCV 106.3*   < > 104.0* 105.0*   105.5* 104.9* 105.1* 103.8*  PLT 414*   < > 491* 455*   442* 412* 421* 321   < > = values in this interval not displayed.   Cardiac Enzymes: No results for input(s): CKTOTAL, CKMB, CKMBINDEX, TROPONINI in the last 168 hours. BNP (last 3 results) No results for input(s): PROBNP in the last 8760 hours. CBG: Recent Labs  Lab 03/18/21 1308 03/18/21 1634 03/18/21 2246 03/19/21 0641 03/19/21 0806  GLUCAP 101* 94 102* 84 71   D-Dimer: No results for input(s): DDIMER in the last 72 hours. Hgb A1c: No results for input(s): HGBA1C in the last 72 hours. Lipid Profile: No results for input(s): CHOL, HDL, LDLCALC, TRIG, CHOLHDL, LDLDIRECT in the last 72 hours. Thyroid function studies: No results for input(s): TSH, T4TOTAL, T3FREE, THYROIDAB in the last 72 hours.  Invalid input(s): FREET3 Anemia work up: No results for input(s): VITAMINB12, FOLATE, FERRITIN, TIBC, IRON, RETICCTPCT in the last 72 hours. Sepsis Labs: Recent Labs  Lab 03/16/21 2323 03/17/21 0328 03/17/21 1701 03/18/21 0325 03/19/21 0255  WBC  --  44.0*   42.7* 31.3* 25.8* 18.2*  LATICACIDVEN 2.1* 1.8 1.2  --   --     Microbiology Recent Results (from the past 240 hour(s))  Resp Panel by RT-PCR  (Flu A&B, Covid) Nasopharyngeal Swab     Status: None   Collection Time: 03/15/21  1:35 PM   Specimen: Nasopharyngeal Swab; Nasopharyngeal(NP) swabs in vial transport medium  Result Value Ref Range Status   SARS Coronavirus 2 by RT PCR NEGATIVE NEGATIVE Final    Comment: (NOTE) SARS-CoV-2 target nucleic acids are  NOT DETECTED.  The SARS-CoV-2 RNA is generally detectable in upper respiratory specimens during the acute phase of infection. The lowest concentration of SARS-CoV-2 viral copies this assay can detect is 138 copies/mL. A negative result does not preclude SARS-Cov-2 infection and should not be used as the sole basis for treatment or other patient management decisions. A negative result may occur with  improper specimen collection/handling, submission of specimen other than nasopharyngeal swab, presence of viral mutation(s) within the areas targeted by this assay, and inadequate number of viral copies(<138 copies/mL). A negative result must be combined with clinical observations, patient history, and epidemiological information. The expected result is Negative.  Fact Sheet for Patients:  EntrepreneurPulse.com.au  Fact Sheet for Healthcare Providers:  IncredibleEmployment.be  This test is no t yet approved or cleared by the [place] FDA and  has been authorized for detection and/or diagnosis of SARS-CoV-2 by FDA under an Emergency Use Authorization (EUA). This EUA will remain  in effect (meaning this test can be used) for the duration of the COVID-19 declaration under Section 564(b)(1) of the Act, 21 U.S.C.section 360bbb-3(b)(1), unless the authorization is terminated  or revoked sooner.       Influenza A by PCR NEGATIVE NEGATIVE Final   Influenza B by PCR NEGATIVE NEGATIVE Final    Comment: (NOTE) The Xpert Xpress SARS-CoV-2/FLU/RSV plus assay is intended as an aid in the diagnosis of influenza from Nasopharyngeal swab specimens  and should not be used as a sole basis for treatment. Nasal washings and aspirates are unacceptable for Xpert Xpress SARS-CoV-2/FLU/RSV testing.  Fact Sheet for Patients: EntrepreneurPulse.com.au  Fact Sheet for Healthcare Providers: IncredibleEmployment.be  This test is not yet approved or cleared by the [place] FDA and has been authorized for detection and/or diagnosis of SARS-CoV-2 by FDA under an Emergency Use Authorization (EUA). This EUA will remain in effect (meaning this test can be used) for the duration of the COVID-19 declaration under Section 564(b)(1) of the Act, 21 U.S.C. section 360bbb-3(b)(1), unless the authorization is terminated or revoked.  Performed at [facility], [place] [address], [place], [place] [address]   Body fluid culture w Gram Stain     Status: None (Preliminary result)   Collection Time: 03/16/21  8:16 AM   Specimen: BILE; Body Fluid  Result Value Ref Range Status   Specimen Description BILE  Final   Special Requests BILE  Final   Gram Stain NO WBC SEEN NO ORGANISMS SEEN   Final   Culture   Final    NO GROWTH 3 DAYS Performed at [facility], [place] [address] [place], [place] [address]    Report Status PENDING  Incomplete  Culture, blood (routine x 2)     Status: None (Preliminary result)   Collection Time: 03/17/21  3:07 AM   Specimen: BLOOD RIGHT HAND  Result Value Ref Range Status   Specimen Description BLOOD RIGHT HAND  Final   Special Requests AEROBIC BOTTLE ONLY Blood Culture adequate volume  Final   Culture   Final    NO GROWTH 1 DAY Performed at [facility], [place] [address], [place], [place] [address]    Report Status PENDING  Incomplete  Culture, blood (routine x 2)     Status: None (Preliminary result)   Collection Time: 03/17/21  3:17 AM   Specimen: BLOOD RIGHT HAND  Result Value Ref Range Status   Specimen Description BLOOD RIGHT HAND  Final   Special Requests  Final    AEROBIC BOTTLE ONLY Blood Culture results may not be optimal due to an inadequate volume of blood received in culture bottles   Culture   Final    NO GROWTH 1 DAY Performed at [place] [address], [place], [place] [address]    Report Status PENDING  Incomplete  Urine Culture     Status: None   Collection Time: 03/18/21 10:30 AM   Specimen: In/Out Cath Urine  Result Value Ref Range Status   Specimen Description IN/OUT CATH URINE  Final   Special Requests NONE  Final   Culture   Final    NO GROWTH Performed at [facility], [place] [address], [place], [place] [address]    Report Status 03/19/2021 FINAL  Final    Procedures and diagnostic studies:  ECHOCARDIOGRAM LIMITED  Result Date: 03/17/2021    ECHOCARDIOGRAM LIMITED REPORT   Patient Name:   JERMON CHALFANT Date of Exam: 03/17/2021 Medical Rec #:  [id number]       Height:       65.0 in Accession #:    [id number]      Weight:       143.3 lb Date of Birth:  December 14, 1940       BSA:          1.717 m Patient Age:    51 years        BP:           116/50 mmHg Patient Gender: M               HR:           114 bpm. Exam Location:  Inpatient Procedure: Cardiac Doppler, Color Doppler and Limited Echo                         STAT ECHO Reported to: Dr Gwyndolyn Kaufman on 03/17/2021 5:50:00 PM. Indications:    Hypotension  History:        Patient has no prior history of Echocardiogram examinations,                 most recent 01/05/2021.                 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  Sonographer:    Clayton Lefort RDCS (AE) Referring Phys: [id number] Darreld Mclean  Sonographer Comments: Image acquisition challenging due to respiratory motion. IMPRESSIONS  1. Left ventricular ejection fraction, by estimation, is 60 to 65%. The left ventricle has normal function. There is moderate concentric left ventricular hypertrophy.  2. Right ventricular systolic  function is normal. The right ventricular size is normal.  3. The mitral valve is degenerative. Severe mitral annular calcification.  4. 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 Echo findings are consistent with normal structure and function of the aortic valve prosthesis. Aortic valve mean gradient measures 5.0 mmHg. Aortic valve Vmax measures 1.40 m/s. DI 0.8. There is no paravalvular leak.  5. The inferior vena cava is normal in size with <50% respiratory variability, suggesting right atrial pressure of 8 mmHg. Comparison(s): Compared to prior TTE on 01/2021, there is no significant change. TAVR valve continues to be well seated with current gradient  54mmHg (previously 11mmHg). FINDINGS  Left Ventricle: Left ventricular ejection fraction, by estimation, is 60 to 65%. The left ventricle has normal function. The left ventricular internal cavity size was normal in size. There is moderate concentric left ventricular hypertrophy. Right Ventricle: The right ventricular size is normal. Right ventricular systolic function is normal. Pericardium: There is no evidence of pericardial effusion. Mitral Valve: The mitral valve is degenerative in appearance. There is moderate thickening of the mitral valve leaflet(s). There is moderate calcification of the mitral valve leaflet(s). Severe mitral annular calcification. Tricuspid Valve: The tricuspid valve is normal in structure. Tricuspid valve regurgitation is trivial. Aortic Valve: DI 0.8. No PVL. The aortic valve has been repaired/replaced. Aortic valve mean gradient measures 5.0 mmHg. Aortic valve peak gradient measures 7.8 mmHg. Aortic valve area, by VTI measures 2.67 cm. 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 Echo findings are consistent with normal structure and function of the aortic valve prosthesis. Pulmonic Valve: The pulmonic valve was  not well visualized. Pulmonic valve regurgitation is trivial. Aorta: The aortic root and ascending aorta are structurally normal, with no evidence of dilitation. Venous: The inferior vena cava is normal in size with less than 50% respiratory variability, suggesting right atrial pressure of 8 mmHg. LEFT VENTRICLE PLAX 2D LVIDd:         3.90 cm LVIDs:         3.00 cm LV PW:         1.30 cm LV IVS:        1.20 cm LVOT diam:     2.00 cm LV SV:         61 LV SV Index:   36 LVOT Area:     3.14 cm  LEFT ATRIUM           Index LA diam:      3.40 cm 1.98 cm/m LA Vol (A4C): 20.0 ml 11.65 ml/m  AORTIC VALVE AV Area (Vmax):    2.51 cm AV Area (Vmean):   2.53 cm AV Area (VTI):     2.67 cm AV Vmax:           140.00 cm/s AV Vmean:          104.000 cm/s AV VTI:            0.228 m AV Peak Grad:      7.8 mmHg AV Mean Grad:      5.0 mmHg LVOT Vmax:         112.00 cm/s LVOT Vmean:        83.900 cm/s LVOT VTI:          0.194 m LVOT/AV VTI ratio: 0.85  AORTA Ao Root diam: 2.50 cm Ao Asc diam:  2.60 cm  SHUNTS Systemic VTI:  0.19 m Systemic Diam: 2.00 cm Gwyndolyn Kaufman MD Electronically signed by Gwyndolyn Kaufman MD Signature Date/Time: 03/17/2021/6:28:04 PM    Final    [place] EKG SITE RITE  Result Date: 03/19/2021 If Site Rite image not attached, placement could not be confirmed due to current cardiac rhythm.   Medications:    (feeding supplement) PROSource Plus  30 mL Oral BID BM   amiodarone  400 mg Oral BID   apixaban  2.5 mg Oral BID   brimonidine  1 drop Right Eye TID   carbidopa-levodopa  1 tablet Oral TID   Chlorhexidine Gluconate Cloth  6 each Topical Q0600   cinacalcet  60 mg Oral Q  M,W,F-HD   doxercalciferol  2 mcg Intravenous Q M,W,F-HD   insulin aspart  0-6 Units Subcutaneous Q4H   latanoprost  1 drop Right Eye QHS   pantoprazole (PROTONIX) IV  40 mg Intravenous Q24H   pilocarpine  1 drop Right Eye QID   sevelamer carbonate  1,600 mg Oral TID WC   Continuous Infusions:  sodium chloride     sodium  chloride     sodium chloride 50 mL/hr at 03/18/21 2238   piperacillin-tazobactam (ZOSYN)  IV 2.25 g (03/18/21 2239)     LOS: 4 days   Geradine Girt  [facility]   How to contact the [facility] Attending or Consulting provider [place] or covering provider during after hours [place], for this patient?  Check the care team in [facility] and look for a) attending/consulting [facility] provider listed and b) the [facility] team listed Log into [email or web address] and use [place]'s universal password to access. If you do not have the password, please contact the hospital operator. Locate the [facility] provider you are looking for under [facility] and page to a number that you can be directly reached. If you still have difficulty reaching the provider, please page the [facility] (Director on Call) for the Hospitalists listed on amion for assistance.  03/19/2021, 12:09 PM

## 2021-03-19 NOTE — Progress Notes (Signed)
Consult placed for IV start. Pt is right arm only, and has been stuck multiple times with and without [place] unsuccessfully. Pt also confused, pulling out lines, and pinching/scratching with IV attempts.

## 2021-03-19 NOTE — Progress Notes (Signed)
HOSPITAL MEDICINE OVERNIGHT EVENT NOTE    Notified by nursing that patient has lost IV access and therefore is not currently receiving his heparin infusion.  IV team was contacted and has attempted IV access 4 times and has failed.  Patient is now refusing any further attempts at IV access for now.  Chart reviewed, patient is currently on anticoagulation with heparin for atrial fibrillation.  IV team will attempt access again later in the shift if able and if patient is agreeable.  Unfortunately heparin will need to be held until this is rectified.  Patient is indeed not going for definitive surgery of the gallbladder day team to consider oral anticoagulation such as Eliquis.  Vernelle Emerald  MD [facility]

## 2021-03-19 NOTE — Progress Notes (Addendum)
Progress Note  Patient Name: Elijah White Date of Encounter: 03/19/2021  [facility] Cardiologist: Evalina Field, MD   Subjective   Mental status has improved. He is able to answer questions. He is sitting up eating with his son.  Sinus rhythm Normal blood pressures Satting well    Inpatient Medications    Scheduled Meds:  (feeding supplement) PROSource Plus  30 mL Oral BID BM   amiodarone  400 mg Oral BID   brimonidine  1 drop Right Eye TID   carbidopa-levodopa  1 tablet Oral TID   Chlorhexidine Gluconate Cloth  6 each Topical Q0600   cinacalcet  60 mg Oral Q M,W,F-HD   doxercalciferol  2 mcg Intravenous Q M,W,F-HD   insulin aspart  0-6 Units Subcutaneous Q4H   latanoprost  1 drop Right Eye QHS   pantoprazole (PROTONIX) IV  40 mg Intravenous Q24H   pilocarpine  1 drop Right Eye QID   sevelamer carbonate  1,600 mg Oral TID WC   Continuous Infusions:  sodium chloride     sodium chloride     sodium chloride 50 mL/hr at 03/18/21 2238   heparin Stopped (03/19/21 0145)   piperacillin-tazobactam (ZOSYN)  IV 2.25 g (03/18/21 2239)   PRN Meds: sodium chloride, sodium chloride, acetaminophen, HYDROmorphone (DILAUDID) injection, ipratropium-albuterol, lidocaine (PF), lidocaine-prilocaine, ondansetron **OR** ondansetron (ZOFRAN) IV, pentafluoroprop-tetrafluoroeth   Vital Signs    Vitals:   03/18/21 1554 03/18/21 1635 03/18/21 1641 03/18/21 2324  BP: 124/70  111/73 (!) 125/56  Pulse: 98 91  (!) 103  Resp: (!) 24 17 20 17   Temp: (!) 97.5 F (36.4 C) 98 F (36.7 C)  97.9 F (36.6 C)  TempSrc: Temporal Oral  Axillary  SpO2: 99% 99%  100%  Weight: 61.8 kg     Height:        Intake/Output Summary (Last 24 hours) at 03/19/2021 0820 Last data filed at 03/19/2021 0600 Gross per 24 hour  Intake 293.07 ml  Output 550 ml  Net -256.93 ml   Last 3 Weights 03/18/2021 03/18/2021 03/16/2021  Weight (lbs) 136 lb 3.9 oz 136 lb 3.9 oz 143 lb 4.8 oz  Weight (kg) 61.8 kg  61.8 kg 65 kg      Telemetry    Sinus rhythm, PACs  - Personally Reviewed  ECG    No new ecg today - Personally Reviewed  Physical Exam   Vitals:   03/18/21 1641 03/18/21 2324  BP: 111/73 (!) 125/56  Pulse:  (!) 103  Resp: 20 17  Temp:  97.9 F (36.6 C)  SpO2:  100%    GEN: no distress; awake , alert,  Neck: No sig JVD Cardiac: RRR, no murmurs, rubs, or gallops.  Respiratory: Clear to auscultation bilaterally. GI: Soft, nontender, non-distended right perc drain in place MS: No edema; No deformity. Skin: warm and well perfused Neuro:  Nonfocal  Psych: Normal affect    Labs    High Sensitivity Troponin:  No results for input(s): TROPONINIHS in the last 720 hours.   Chemistry Recent Labs  Lab 03/16/21 0545 03/16/21 2200 03/17/21 0328 03/17/21 1701 03/18/21 0325  NA 139   < > 137 137 138  K 5.4*   < > 6.0* 5.6* 5.5*  CL 85*   < > 91* 93* 95*  CO2 29   < > 27 23 21*  GLUCOSE 249*   < > 140* 130* 118*  BUN 41*   < > 64* 73* 76*  CREATININE 8.56*   < >  9.84* 10.14* 10.40*  CALCIUM 9.2   < > 8.6* 8.5* 9.0  PROT 8.5*  --  7.1  --  6.3*  ALBUMIN 2.6*   < > 2.4* 2.0* 2.0*  AST 48*  --  57*  --  30  ALT 14  --  15  --  7  ALKPHOS 98  --  106  --  79  BILITOT 1.0  --  1.4*  --  1.0  GFRNONAA 6*   < > 5* 5* 5*  ANIONGAP 25*   < > 19* 21* 22*   < > = values in this interval not displayed.    Lipids No results for input(s): CHOL, TRIG, HDL, LABVLDL, LDLCALC, CHOLHDL in the last 168 hours.  Hematology Recent Labs  Lab 03/17/21 1701 03/18/21 0325 03/19/21 0255  WBC 31.3* 25.8* 18.2*  RBC 3.49* 3.32* 3.17*  HGB 11.9* 11.3* 10.9*  HCT 36.6* 34.9* 32.9*  MCV 104.9* 105.1* 103.8*  MCH 34.1* 34.0 34.4*  MCHC 32.5 32.4 33.1  RDW 14.6 14.2 14.3  PLT 412* 421* 321   Thyroid No results for input(s): TSH, FREET4 in the last 168 hours.  BNPNo results for input(s): BNP, PROBNP in the last 168 hours.  DDimer No results for input(s): DDIMER in the last 168 hours.    Radiology    CT ABDOMEN PELVIS W CONTRAST  Result Date: 03/17/2021 CLINICAL DATA:  Acute generalized abdominal pain. EXAM: CT ABDOMEN AND PELVIS WITH CONTRAST TECHNIQUE: Multidetector CT imaging of the abdomen and pelvis was performed using the standard protocol following bolus administration of intravenous contrast. RADIATION DOSE REDUCTION: This exam was performed according to the departmental dose-optimization program which includes automated exposure control, adjustment of the mA and/or kV according to patient size and/or use of iterative reconstruction technique. CONTRAST:  37mL OMNIPAQUE IOHEXOL 300 MG/ML  SOLN COMPARISON:  March 15, 2021. FINDINGS: Lower chest: Right lower lobe pneumonia or atelectasis is noted. Hepatobiliary: Interval placement of percutaneous cholecystostomy tube is noted. Continued gallbladder distention and cholelithiasis is noted. No biliary dilatation is noted. The liver is unremarkable. Pancreas: Unremarkable. No pancreatic ductal dilatation or surrounding inflammatory changes. Spleen: Normal in size without focal abnormality. Adrenals/Urinary Tract: Stable 2.6 cm left adrenal nodule is noted. Stable right adrenal gland enlargement is noted. Mild bilateral renal atrophy is noted. No nephrolithiasis is noted. Moderate right hydroureteronephrosis is noted without obstructing calculus. Stable dilatation of left renal pelvis is noted without calyceal dilatation. Mild left ureteral dilatation is noted. No obstructing calculus is noted. Mild urinary bladder distention is noted. Stomach/Bowel: Stomach is within normal limits. Appendix appears normal. No evidence of bowel wall thickening, distention, or inflammatory changes. Vascular/Lymphatic: Aortic atherosclerosis. No enlarged abdominal or pelvic lymph nodes. Reproductive: Status post prostatic brachytherapy seed placement. Other: No abdominal wall hernia or abnormality. No abdominopelvic ascites. Musculoskeletal: No acute or  significant osseous findings. IMPRESSION: Interval development of right lower lobe opacity is noted concerning for pneumonia or atelectasis. Interval placement of percutaneous cholecystostomy tube in grossly good position. Stable gallbladder distension and cholelithiasis is noted. Grossly stable moderate right hydroureteronephrosis is noted as well as prominent left renal pelvis and mild distal left ureteral dilatation, without evidence of obstructing calculus. Mild urinary bladder distention is noted. 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 Aortic Atherosclerosis (ICD10-I70.0). Electronically Signed   By: Marijo Conception M.D.   On: 03/17/2021 09:41   ECHOCARDIOGRAM LIMITED  Result Date: 03/17/2021    ECHOCARDIOGRAM LIMITED REPORT  Patient Name:   Elijah White Date of Exam: 03/17/2021 Medical Rec #:  [id number]       Height:       65.0 in Accession #:    [id number]      Weight:       143.3 lb Date of Birth:  1940/09/21       BSA:          1.717 m Patient Age:    15 years        BP:           116/50 mmHg Patient Gender: M               HR:           114 bpm. Exam Location:  Inpatient Procedure: Cardiac Doppler, Color Doppler and Limited Echo                         STAT ECHO Reported to: Dr Gwyndolyn Kaufman on 03/17/2021 5:50:00 PM. Indications:    Hypotension  History:        Patient has no prior history of Echocardiogram examinations,                 most recent 01/05/2021.                 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  Sonographer:    Clayton Lefort RDCS (AE) Referring Phys: [id number] Darreld Mclean  Sonographer Comments: Image acquisition challenging due to respiratory motion. IMPRESSIONS  1. Left ventricular ejection fraction, by estimation, is 60 to 65%. The left ventricle has normal function. There is moderate concentric left ventricular hypertrophy.  2. Right ventricular  systolic function is normal. The right ventricular size is normal.  3. The mitral valve is degenerative. Severe mitral annular calcification.  4. 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 Echo findings are consistent with normal structure and function of the aortic valve prosthesis. Aortic valve mean gradient measures 5.0 mmHg. Aortic valve Vmax measures 1.40 m/s. DI 0.8. There is no paravalvular leak.  5. The inferior vena cava is normal in size with <50% respiratory variability, suggesting right atrial pressure of 8 mmHg. Comparison(s): Compared to prior TTE on 01/2021, there is no significant change. TAVR valve continues to be well seated with current gradient 61mmHg (previously 9mmHg). FINDINGS  Left Ventricle: Left ventricular ejection fraction, by estimation, is 60 to 65%. The left ventricle has normal function. The left ventricular internal cavity size was normal in size. There is moderate concentric left ventricular hypertrophy. Right Ventricle: The right ventricular size is normal. Right ventricular systolic function is normal. Pericardium: There is no evidence of pericardial effusion. Mitral Valve: The mitral valve is degenerative in appearance. There is moderate thickening of the mitral valve leaflet(s). There is moderate calcification of the mitral valve leaflet(s). Severe mitral annular calcification. Tricuspid Valve: The tricuspid valve is normal in structure. Tricuspid valve regurgitation is trivial. Aortic Valve: DI 0.8. No PVL. The aortic valve has been repaired/replaced. Aortic valve mean gradient measures 5.0 mmHg. Aortic valve peak gradient measures 7.8 mmHg. Aortic valve area, by VTI measures 2.67 cm. 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 Echo findings  are consistent with normal structure and function of the aortic valve prosthesis. Pulmonic Valve: The pulmonic  valve was not well visualized. Pulmonic valve regurgitation is trivial. Aorta: The aortic root and ascending aorta are structurally normal, with no evidence of dilitation. Venous: The inferior vena cava is normal in size with less than 50% respiratory variability, suggesting right atrial pressure of 8 mmHg. LEFT VENTRICLE PLAX 2D LVIDd:         3.90 cm LVIDs:         3.00 cm LV PW:         1.30 cm LV IVS:        1.20 cm LVOT diam:     2.00 cm LV SV:         61 LV SV Index:   36 LVOT Area:     3.14 cm  LEFT ATRIUM           Index LA diam:      3.40 cm 1.98 cm/m LA Vol (A4C): 20.0 ml 11.65 ml/m  AORTIC VALVE AV Area (Vmax):    2.51 cm AV Area (Vmean):   2.53 cm AV Area (VTI):     2.67 cm AV Vmax:           140.00 cm/s AV Vmean:          104.000 cm/s AV VTI:            0.228 m AV Peak Grad:      7.8 mmHg AV Mean Grad:      5.0 mmHg LVOT Vmax:         112.00 cm/s LVOT Vmean:        83.900 cm/s LVOT VTI:          0.194 m LVOT/AV VTI ratio: 0.85  AORTA Ao Root diam: 2.50 cm Ao Asc diam:  2.60 cm  SHUNTS Systemic VTI:  0.19 m Systemic Diam: 2.00 cm Gwyndolyn Kaufman MD Electronically signed by Gwyndolyn Kaufman MD Signature Date/Time: 03/17/2021/6:28:04 PM    Final     Cardiac Studies    TTE 03/17/2021 1. Left ventricular ejection fraction, by estimation, is 60 to 65%. The  left ventricle has normal function. There is moderate concentric left  ventricular hypertrophy.   2. Right ventricular systolic function is normal. The right ventricular  size is normal.   3. The mitral valve is degenerative. Severe mitral annular calcification.   4. The aortic valve has been repaired/replaced. There is a 26 mm Sapien  prosthetic (TAVR) valve present in the aortic position. Echo findings are  consistent with normal structure and function of the aortic valve  prosthesis. Aortic valve mean gradient  measures 5.0 mmHg. Aortic valve Vmax measures 1.40 m/s. DI 0.8. There is  no paravalvular leak.   5. The inferior vena  cava is normal in size with <50% respiratory  variability, suggesting right atrial pressure of 8 mmHg.   Comparison(s): Compared to prior TTE on 01/2021, there is no significant  change. TAVR valve continues to be well seated with current gradient 20mmHg  (previously 72mmHg).    CHA2DS2-VASc Score = 4   This indicates a 4.8% annual risk of stroke. The patient's score is based upon: CHF History: 0 HTN History: 1 Diabetes History: 1 Stroke History: 0 Vascular Disease History: 0 Age Score: 2 Gender Score: 0      Patient Profile     Elijah White is an 81 y.o. male with medical history significant of ESRD on HD  MWF, aortic stenosis status post TAVR 2021, nonobstructive CAD, Parkinson's disease, PAD on Plavix, s/p left BKA, HTN, IIDM, HLD,  came with recurrent right upper quadrant abdominal pain.  Thought to have cholecystitis and drain placed by IR.  Stay in hospital complicated by a fib with RVR,  sepsis  2/2 acute cholecystitis ; bld cultures pending s/p perc drain on IV zosyn now in sinus rhythm on amio gtt  Assessment & Plan    #New Onset Atrial Fibrillation: Chads2vasc4. New onset in the setting of acute chole. He's not a good candidate for sotalol/dofetilide considering this requires a load and close Qtc follow up. Rate control was attempted but he had significant RVR with hypotension - converted to sinus rhythm 1/14 - can continue amiodarone load 400 mg BID for 1 week, 200 mg daily there after - can likely continue with amiodarone long-term considering age/dementia  - heparin gtt for now , can convert to [facility] prior to DC   #PAD: s/p L BKA - can hold plavix for today; and if tolerating AC can restart tomorrow - can continue home statin  #S/p 26 mm Sapien prosthetic (TAVR) - Well seated. Normal mean gradient     For questions or updates, please contact [place] Please consult [email or web address] for contact info under        Signed, Janina Mayo, MD  03/19/2021, 8:20  AM

## 2021-03-19 NOTE — Progress Notes (Signed)
I did not put patient in soft restraints/mittens. He was not pulling at anything and it was not needed. Dr. Eliseo Squires notified

## 2021-03-19 NOTE — Progress Notes (Signed)
Pt has lost IV access that had IV heparin and antibiotics infusing.  Dr. Marlyce Huge notified via [facility].  Awaiting return call.

## 2021-03-20 LAB — GLUCOSE, CAPILLARY
Glucose-Capillary: 128 mg/dL — ABNORMAL HIGH (ref 70–99)
Glucose-Capillary: 144 mg/dL — ABNORMAL HIGH (ref 70–99)
Glucose-Capillary: 155 mg/dL — ABNORMAL HIGH (ref 70–99)
Glucose-Capillary: 160 mg/dL — ABNORMAL HIGH (ref 70–99)
Glucose-Capillary: 181 mg/dL — ABNORMAL HIGH (ref 70–99)
Glucose-Capillary: 93 mg/dL (ref 70–99)

## 2021-03-20 LAB — RENAL FUNCTION PANEL
Albumin: 1.7 g/dL — ABNORMAL LOW (ref 3.5–5.0)
Anion gap: 15 (ref 5–15)
BUN: 45 mg/dL — ABNORMAL HIGH (ref 8–23)
CO2: 24 mmol/L (ref 22–32)
Calcium: 8.6 mg/dL — ABNORMAL LOW (ref 8.9–10.3)
Chloride: 96 mmol/L — ABNORMAL LOW (ref 98–111)
Creatinine, Ser: 6.44 mg/dL — ABNORMAL HIGH (ref 0.61–1.24)
GFR, Estimated: 8 mL/min — ABNORMAL LOW (ref >60)
Glucose, Bld: 163 mg/dL — ABNORMAL HIGH (ref 70–99)
Phosphorus: 4 mg/dL (ref 2.5–4.6)
Potassium: 3.2 mmol/L — ABNORMAL LOW (ref 3.5–5.1)
Sodium: 135 mmol/L (ref 135–145)

## 2021-03-20 LAB — CBC
HCT: 25.4 % — ABNORMAL LOW (ref 39.0–52.0)
Hemoglobin: 8.5 g/dL — ABNORMAL LOW (ref 13.0–17.0)
MCH: 34.4 pg — ABNORMAL HIGH (ref 26.0–34.0)
MCHC: 33.5 g/dL (ref 30.0–36.0)
MCV: 102.8 fL — ABNORMAL HIGH (ref 80.0–100.0)
Platelets: 277 10*3/uL (ref 150–400)
RBC: 2.47 MIL/uL — ABNORMAL LOW (ref 4.22–5.81)
RDW: 14.5 % (ref 11.5–15.5)
WBC: 12.1 10*3/uL — ABNORMAL HIGH (ref 4.0–10.5)
nRBC: 0 % (ref 0.0–0.2)

## 2021-03-20 MED ORDER — DARBEPOETIN ALFA 40 MCG/0.4ML IJ SOSY
40.0000 ug | PREFILLED_SYRINGE | INTRAMUSCULAR | Status: DC
  Administered 2021-03-20: 40 ug via INTRAVENOUS
  Filled 2021-03-20: qty 0.4

## 2021-03-20 NOTE — Progress Notes (Signed)
[facility] Progress Note   Subjective:   Seen in room. No cp, dyspnea, n/v. States he was coughing up phlegm this am. For dialysis today  Objective Vitals:   03/19/21 2039 03/20/21 0044 03/20/21 0600 03/20/21 0754  BP: (!) 100/50 (!) 98/42 (!) 95/53 (!) 82/59  Pulse: 86 62 68 68  Resp: 16 14 15  (!) 21  Temp:  97.7 F (36.5 C) 97.7 F (36.5 C) 98.8 F (37.1 C)  TempSrc:  Oral Oral Oral  SpO2: 100% 100% 100% 100%  Weight:      Height:         Additional Objective Labs: Basic Metabolic Panel: Recent Labs  Lab 03/16/21 2200 03/17/21 0328 03/17/21 1701 03/18/21 0325 03/20/21 0600  NA 139   < > 137 138 135  K 6.1*   < > 5.6* 5.5* 3.2*  CL 88*   < > 93* 95* 96*  CO2 28   < > 23 21* 24  GLUCOSE 170*   < > 130* 118* 163*  BUN 59*   < > 73* 76* 45*  CREATININE 9.69*   < > 10.14* 10.40* 6.44*  CALCIUM 9.2   < > 8.5* 9.0 8.6*  PHOS 6.6*  --  6.7*  --  4.0   < > = values in this interval not displayed.   CBC: Recent Labs  Lab 03/15/21 0855 03/16/21 0251 03/17/21 0328 03/17/21 1701 03/18/21 0325 03/19/21 0255 03/20/21 0600  WBC 19.1*   < > 44.0*   42.7* 31.3* 25.8* 18.2* 12.1*  NEUTROABS 16.9*  --  40.8*  --   --   --   --   HGB 13.8   < > 13.6   13.2 11.9* 11.3* 10.9* 8.5*  HCT 42.1   < > 39.7   40.3 36.6* 34.9* 32.9* 25.4*  MCV 106.3*   < > 105.0*   105.5* 104.9* 105.1* 103.8* 102.8*  PLT 414*   < > 455*   442* 412* 421* 321 277   < > = values in this interval not displayed.   Blood Culture    Component Value Date/Time   SDES IN/OUT CATH URINE 03/18/2021 1030   SPECREQUEST NONE 03/18/2021 1030   CULT  03/18/2021 1030    NO GROWTH Performed at [facility], [place] [address] [place], [place] [address]    REPTSTATUS 03/19/2021 FINAL 03/18/2021 1030     Physical Exam General: Sitting up in bed, blind  Heart: RRR No m,r,g Lungs: Clear bilaterally  Abdomen: soft non-tender  Extremities: No LE edema  Dialysis Access: LUE AVF +bruit    Medications:  sodium chloride     sodium chloride     sodium chloride 50 mL/hr at 03/20/21 0538   piperacillin-tazobactam (ZOSYN)  IV 2.25 g (03/20/21 0542)    (feeding supplement) PROSource Plus  30 mL Oral BID BM   amiodarone  400 mg Oral BID   apixaban  2.5 mg Oral BID   brimonidine  1 drop Right Eye TID   carbidopa-levodopa  1 tablet Oral TID   Chlorhexidine Gluconate Cloth  6 each Topical Q0600   cinacalcet  60 mg Oral Q M,W,F-HD   doxercalciferol  2 mcg Intravenous Q M,W,F-HD   insulin aspart  0-6 Units Subcutaneous Q4H   latanoprost  1 drop Right Eye QHS   pantoprazole (PROTONIX) IV  40 mg Intravenous Q24H   pilocarpine  1 drop Right Eye QID   sevelamer carbonate  1,600 mg Oral TID  WC   sodium chloride flush  10-40 mL Intracatheter Q12H    Dialysis Orders:  [place] MWF  4h  400/600  66kg 2/2 bath  P2  AVF  Hep none  - no esa/ Fe  - sensipar 60 mg po tiw  - hectorol 3 ug tiw IV  Assessment/Plan: Acute Cholelithiasis: Per primary. S/P perc chole tube by IR 1/12. Per primary.  New onset atrial fib w/ RVR - converted to sinus 01/14 seen by cardiology.  IV amio /heparin gtt started.  Now p.o. load and convert to [facility] prior to discharge  Acute urinary retention/UTI/hydronephrosis. Urology consulted. Foley in place. On IV Zosyn.   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 Back on schedule. HD today.   BP/volume  - Soft BP's.  Holding home BP meds. No UF with HD.    Anemia  - Hgb 10.9 >8.5. Not on ESA. Will order with HD today.   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 Change to Renvela binders. Lower VDRA dose. Continue sensipar.  Follow-up trend  Nutrition - Albumin 2.0 Protein supps ordered. Renal diet.  Parkinson's disease-per primary DMT2-per primary  H/O TAVR DNR  [name] PA-C [facility] 03/20/2021,10:25 AM

## 2021-03-20 NOTE — Evaluation (Signed)
Physical Therapy Evaluation/ Discharge Patient Details Name: Elijah White MRN: [id number] DOB: November 20, 1940 Today's Date: 03/20/2021  History of Present Illness  81 yo admitted 1/11 with RUQ pain with acute cholecystitis s/p perc cholecystostomy 1/12. Pt with new onset Afib with RVR 1/13 and acute urinary retention. PMhx: Parkinson's, Lt BKA, HTN, ESRD MWF, AS s/p TAVR 2021, CAD, PVD, T2DM, HLD, legally blind  Clinical Impression  Pt initially stating he is assisting with getting OOB to chair at [facility] and that he even uses RW to get to [facility]. With further questioning he is assisted for all ADL, iADL and lifted to chair for HD. Pt is no longer participating in P.T. at SNF due to lack of progress and pt is currently at baseline functional status. Pt is max assist for transfers to EOB and back and will require +2 assist or lift for OOB. Pt does not require further acute therapy and recommend daily pressure relief mobility with nursing staff. Will sign off with pt aware and agreeable.      Recommendations for follow up therapy are one component of a multi-disciplinary discharge planning process, led by the attending physician.  Recommendations may be updated based on patient status, additional functional criteria and insurance authorization.  Follow Up Recommendations Long-term institutional care without follow-up therapy    Assistance Recommended at Discharge Frequent or constant Supervision/Assistance  Patient can return home with the following  A lot of help with bathing/dressing/bathroom;Two people to help with walking and/or transfers;Direct supervision/assist for medications management;Assistance with feeding;Assistance with cooking/housework;Direct supervision/assist for financial management;Assist for transportation    Equipment Recommendations None recommended by PT  Recommendations for Other Services       Functional Status Assessment Patient has not had a recent decline in their  functional status     Precautions / Restrictions Precautions Precautions: Fall;Other (comment) Precaution Comments: Lt BKA, legally blind      Mobility  Bed Mobility Overal bed mobility: Needs Assistance Bed Mobility: Supine to Sit;Sit to Supine     Supine to sit: Mod assist Sit to supine: Max assist   General bed mobility comments: mod assist to pivot body toward right side of bed and acheive sitting. Pt with flexed posture with right anterior lean with mod assist for sitting balance. Return to bed with max assist as well as max assist to slide toward HOB. EOB pt clarified that he does not stand and is lifted to chair for HD    Transfers                        Ambulation/Gait                  Stairs            Wheelchair Mobility    Modified Rankin (Stroke Patients Only)       Balance Overall balance assessment: Needs assistance   Sitting balance-Leahy Scale: Poor                                       Pertinent Vitals/Pain Pain Assessment: No/denies pain    Home Living Family/patient expects to be discharged to:: Skilled nursing facility                        Prior Function Prior Level of Function : Needs assist  Physical Assist : Mobility (physical);ADLs (physical) Mobility (physical): Transfers ADLs (physical): Feeding;Grooming;Bathing;Dressing;Toileting Mobility Comments: pt initially stating he stands with RW to [place] but then recanted stating he is lifted to chair for HD ADLs Comments: staff feeds him due to visual deficits, pt uses bedpan for toileting and staff assists with all bathing/dressing     Hand Dominance        Extremity/Trunk Assessment   Upper Extremity Assessment Upper Extremity Assessment: Generalized weakness    Lower Extremity Assessment Lower Extremity Assessment: Generalized weakness (pt with limited ablility to flex RLE and assist with pushing up in bed for bed  mobility)    Cervical / Trunk Assessment Cervical / Trunk Assessment: Kyphotic (anterior right lean in sitting with kyphosis)  Communication   Communication: No difficulties  Cognition Arousal/Alertness: Awake/alert Behavior During Therapy: WFL for tasks assessed/performed Overall Cognitive Status: Impaired/Different from baseline Area of Impairment: Memory;Orientation;Problem solving;Safety/judgement                     Memory: Decreased short-term memory   Safety/Judgement: Decreased awareness of deficits   Problem Solving: Slow processing          General Comments      Exercises     Assessment/Plan    PT Assessment Patient does not need any further PT services  PT Problem List         PT Treatment Interventions      PT Goals (Current goals can be found in the Care Plan section)  Acute Rehab PT Goals PT Goal Formulation: All assessment and education complete, DC therapy    Frequency       Co-evaluation               AM-PAC PT "6 Clicks" Mobility  Outcome Measure Help needed turning from your back to your side while in a flat bed without using bedrails?: A Lot Help needed moving from lying on your back to sitting on the side of a flat bed without using bedrails?: A Lot Help needed moving to and from a bed to a chair (including a wheelchair)?: Total Help needed standing up from a chair using your arms (e.g., wheelchair or bedside chair)?: Total Help needed to walk in hospital room?: Total Help needed climbing 3-5 steps with a railing? : Total 6 Click Score: 8    End of Session   Activity Tolerance: Patient tolerated treatment well Patient left: in bed;with call bell/phone within reach;with bed alarm set Nurse Communication: Mobility status;Need for lift equipment PT Visit Diagnosis: Other abnormalities of gait and mobility (R26.89);Muscle weakness (generalized) (M62.81)    Time: 5465-0354 PT Time Calculation (min) (ACUTE ONLY): 18  min   Charges:   PT Evaluation $PT Eval Moderate Complexity: 1 Mod          Aqib Lough P, PT Acute Rehabilitation Services Pager: [phone] Office: [phone]   Bronsen Serano B Myonna Chisom 03/20/2021, 1:13 PM

## 2021-03-20 NOTE — Care Management Important Message (Signed)
Important Message  Patient Details  Name: Elijah White MRN: [id number] Date of Birth: [birth year]   Medicare Important Message Given:  Yes     Jaeleah Smyser Montine Circle 03/20/2021, 3:50 PM

## 2021-03-20 NOTE — Progress Notes (Signed)
SLP Cancellation Note  Patient Details Name: DAVON ABDELAZIZ MRN: [id number] DOB: [birth year]   Cancelled treatment:       Reason Eval/Treat Not Completed: Patient at procedure or test/unavailable (HD)    Osie Bond., M.A. [facility] Pager [phone] Office [phone]  03/20/2021, 3:21 PM

## 2021-03-20 NOTE — Progress Notes (Signed)
Referring Physician(s): Dr. Malachy Moan   Supervising Physician: Ruel Favors  Patient Status:  [facility] - In-pt  Chief Complaint: S/p placement of 30F percutaneous transhepatic cholecystostomy (gallbladder) tube 03/16/21.    Subjective:  Pt denies pain in area of drain. He states he is eating/drinking normally.   Allergies: Patient has no known allergies.  Medications: Prior to Admission medications   Medication Sig Start Date End Date Taking? Authorizing Provider  acetaminophen (TYLENOL) 500 MG tablet Take 1,000 mg by mouth every 6 (six) hours as needed for moderate pain or headache.   Yes [provider]  aspirin EC 81 MG tablet Take 81 mg by mouth in the morning. Swallow whole.   Yes [provider]  atorvastatin (LIPITOR) 10 MG tablet Take 1 tablet (10 mg total) by mouth in the morning. 08/16/20  Yes Rhyne, Samantha J, PA-C  AURYXIA 1 GM 210 MG(Fe) tablet Take 420 mg by mouth 3 (three) times daily with meals. 05/30/20  Yes [provider]  B Complex-C-Folic Acid (DIALYVITE 800) 0.8 MG TABS Take 1 tablet by mouth in the morning. 05/08/19  Yes [provider]  brimonidine (ALPHAGAN) 0.15 % ophthalmic solution Place 1 drop into the right eye 3 (three) times daily.   Yes [provider]  carbidopa-levodopa (SINEMET) 25-100 MG tablet Take 1 tablet by mouth 3 (three) times daily. Patient taking differently: Take 1 tablet by mouth 3 (three) times daily. 1 TABLET @ 8am/ 12pm/ 4pm 10/19/20 03/15/21 Yes Curatolo, Adam, DO  clopidogrel (PLAVIX) 75 MG tablet Take 1 tablet (75 mg total) by mouth daily with breakfast. 07/25/20  Yes Cephus Shelling, MD  docusate sodium (COLACE) 100 MG capsule Take 100 mg by mouth 2 (two) times daily.   Yes [provider]  dorzolamide-timolol (COSOPT) 22.3-6.8 MG/ML ophthalmic solution Place 1 drop into the right eye 2 (two) times daily. 06/25/17  Yes [provider]  gabapentin (NEURONTIN) 100 MG  capsule Take 1 capsule (100 mg total) by mouth daily. 10/19/20 03/15/21 Yes Curatolo, Adam, DO  isosorbide mononitrate (IMDUR) 30 MG 24 hr tablet Take 1 tablet (30 mg total) by mouth in the morning. 08/16/20  Yes Rhyne, Ames Coupe, PA-C  Melatonin 5 MG CAPS Take 5 mg by mouth at bedtime.   Yes [provider]  pantoprazole (PROTONIX) 40 MG tablet Take 1 tablet (40 mg total) by mouth daily at 12 noon. 08/16/20  Yes Rhyne, Ames Coupe, PA-C  pilocarpine (PILOCAR) 4 % ophthalmic solution Place 1 drop into the right eye 4 (four) times daily.    Yes [provider]  polyethylene glycol (MIRALAX / GLYCOLAX) 17 g packet Take 17 g by mouth at bedtime.   Yes [provider]  ROCKLATAN 0.02-0.005 % SOLN Place 1 drop into the right eye at bedtime. 06/20/20  Yes [provider]  blood glucose meter kit and supplies KIT Dispense based on patient and insurance preference. Use up to four times daily as directed. (FOR ICD-9 250.00, 250.01). For QAC - HS accuchecks. 03/30/18   Leroy Sea, MD  carvedilol (COREG) 3.125 MG tablet Take 1 tablet (3.125 mg total) by mouth 2 (two) times daily with a meal. Patient not taking: Reported on 03/15/2021 08/16/20   Dara Lords, PA-C  Doxercalciferol (HECTOROL IV) Doxercalciferol (Hectorol) 08/09/20 08/28/21  [provider]  Lancets [facility] [place] PLUS [place]) MISC  03/31/18   [provider]  Methoxy PEG-Epoetin Beta (MIRCERA IJ) Mircera 09/05/20 09/04/21  [provider]  ONETOUCH VERIO test strip USE AS DIRECTED TO TEST FOUR TIMES A DAY 07/31/18   [name], MD  oxyCODONE-acetaminophen (PERCOCET) 5-325 MG tablet Take 1 tablet by mouth every 6 (six) hours as needed (pain). Patient not taking: Reported on 03/15/2021 08/19/20   Karoline Caldwell, PA-C     Vital Signs: BP (!) 110/53 (BP Location: Right Leg)    Pulse 67    Temp 97.9 F (36.6 C) (Oral)    Resp 18    Ht $R'5\' 5"'Dd$  (1.651 m)    Wt 136 lb 3.9 oz (61.8 kg)     SpO2 99%    BMI 22.67 kg/m   Physical Exam Constitutional:      Appearance: He is not ill-appearing.  HENT:     Head: Normocephalic and atraumatic.  Cardiovascular:     Rate and Rhythm: Normal rate and regular rhythm.     Pulses: Normal pulses.  Pulmonary:     Effort: Pulmonary effort is normal. No respiratory distress.  Abdominal:     Comments: Drain unremarkable with sutures/statlock in place. Dressing C/D/I. No redness, bleeding or drainage from site. No OP in gravity bag. Drain flushed but does not aspirate. Pt denies pain with flushing. 20 cc charted OP in Epic last 24 hours.   Skin:    General: Skin is warm and dry.  Neurological:     Mental Status: He is alert and oriented to person, place, and time.  Psychiatric:        Mood and Affect: Mood normal.        Behavior: Behavior normal.        Thought Content: Thought content normal.        Judgment: Judgment normal.    Imaging: CT ABDOMEN PELVIS W CONTRAST  Result Date: 03/17/2021 CLINICAL DATA:  Acute generalized abdominal pain. EXAM: CT ABDOMEN AND PELVIS WITH CONTRAST TECHNIQUE: Multidetector CT imaging of the abdomen and pelvis was performed using the standard protocol following bolus administration of intravenous contrast. RADIATION DOSE REDUCTION: This exam was performed according to the departmental dose-optimization program which includes automated exposure control, adjustment of the mA and/or kV according to patient size and/or use of iterative reconstruction technique. CONTRAST:  49mL OMNIPAQUE IOHEXOL 300 MG/ML  SOLN COMPARISON:  March 15, 2021. FINDINGS: Lower chest: Right lower lobe pneumonia or atelectasis is noted. Hepatobiliary: Interval placement of percutaneous cholecystostomy tube is noted. Continued gallbladder distention and cholelithiasis is noted. No biliary dilatation is noted. The liver is unremarkable. Pancreas: Unremarkable. No pancreatic ductal dilatation or surrounding inflammatory changes. Spleen:  Normal in size without focal abnormality. Adrenals/Urinary Tract: Stable 2.6 cm left adrenal nodule is noted. Stable right adrenal gland enlargement is noted. Mild bilateral renal atrophy is noted. No nephrolithiasis is noted. Moderate right hydroureteronephrosis is noted without obstructing calculus. Stable dilatation of left renal pelvis is noted without calyceal dilatation. Mild left ureteral dilatation is noted. No obstructing calculus is noted. Mild urinary bladder distention is noted. Stomach/Bowel: Stomach is within normal limits. Appendix appears normal. No evidence of bowel wall thickening, distention, or inflammatory changes. Vascular/Lymphatic: Aortic atherosclerosis. No enlarged abdominal or pelvic lymph nodes. Reproductive: Status post prostatic brachytherapy seed placement. Other: No abdominal wall hernia or abnormality. No abdominopelvic ascites. Musculoskeletal: No acute or significant osseous findings. IMPRESSION: Interval development of right lower lobe opacity is noted concerning for pneumonia or atelectasis. Interval placement of percutaneous cholecystostomy tube in grossly good position. Stable gallbladder distension and cholelithiasis is noted. Grossly stable moderate right hydroureteronephrosis is  noted as well as prominent left renal pelvis and mild distal left ureteral dilatation, without evidence of obstructing calculus. Mild urinary bladder distention is noted. 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 Aortic Atherosclerosis (ICD10-I70.0). Electronically Signed   By: Marijo Conception M.D.   On: 03/17/2021 09:41   IR Perc Cholecystostomy  Result Date: 03/16/2021 INDICATION: 81 year old male with right upper quadrant pain and imaging findings concerning for acute calculus cholecystitis. A nuclear medicine HIDA scan was ordered to confirm, however patient's clinical status has deteriorated overnight with significant increase in  leukocytosis, new oxygen requirement and increasing pain. Therefore, we are proceeding directly with percutaneous transhepatic cholecystostomy tube placement. EXAM: CHOLECYSTOSTOMY MEDICATIONS: In patient currently on intravenous Zosyn. No additional antibiotic prophylaxis administered. ANESTHESIA/SEDATION: Moderate (conscious) sedation was employed during this procedure. A total of Versed 0.5 mg and Fentanyl 25 mcg and 0.5 mg Dilaudid was administered intravenously. Moderate Sedation Time: 10 minutes. The patient's level of consciousness and vital signs were monitored continuously by radiology nursing throughout the procedure under my direct supervision. FLUOROSCOPY TIME:  Fluoroscopy Time: 0 minutes 36 seconds (2 mGy). COMPLICATIONS: None immediate. PROCEDURE: Informed written consent was obtained from the patient after a thorough discussion of the procedural risks, benefits and alternatives. All questions were addressed. Maximal Sterile Barrier Technique was utilized including caps, mask, sterile gowns, sterile gloves, sterile drape, hand hygiene and skin antiseptic. A timeout was performed prior to the initiation of the procedure. The right upper quadrant was interrogated with ultrasound. The gallbladder is markedly distended. A suitable skin entry site was selected and marked. The overlying skin was sterilely prepped and draped in the standard fashion using chlorhexidine skin prep. Local anesthesia was attained by infiltration with 1% lidocaine. A small dermatotomy was made. Under real-time ultrasound guidance, a 21 gauge Accustick needle was advanced along a short transhepatic course and into the gallbladder lumen. Images were obtained and stored for the medical record. A 0.018 wire was advanced in the gallbladder lumen. The needle was exchanged for the Accustick transitional dilator which was advanced over the wire and into the gallbladder. Aspiration yields dark black bile. A sample was sent for Gram stain  and culture. A 0.035 wire was coiled in the gallbladder lumen. The skin tract was dilated to 10 [place]. A Cook 10.2 [place] all-purpose drainage catheter was advanced over the wire and formed in the gallbladder. The catheter was connected to gravity bag drainage and secured to the skin with 0 Prolene suture. Overall, the patient tolerated the procedure well. IMPRESSION: Successful placement of 10 French percutaneous transhepatic cholecystostomy tube for the indication of acute calculus cholecystitis. Electronically Signed   By: Jacqulynn Cadet M.D.   On: 03/16/2021 14:07   DG CHEST PORT 1 VIEW  Result Date: 03/17/2021 CLINICAL DATA:  Chest abdominal pain.  Aspiration in airway. EXAM: PORTABLE CHEST 1 VIEW COMPARISON:  AP chest 03/16/2021 FINDINGS: The patient is mildly rightward rotated. Within this limitation, cardiac silhouette and mediastinal contours are grossly unchanged and within normal limits. Calcification is seen within the aortic arch. 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 The lungs appear clear. No pleural effusion or pneumothorax. No acute skeletal abnormality. IMPRESSION: No acute lung process. Electronically Signed   By: Yvonne Kendall M.D.   On: 03/17/2021 09:21   ECHOCARDIOGRAM LIMITED  Result Date: 03/17/2021    ECHOCARDIOGRAM LIMITED REPORT   Patient Name:   Elijah White Date of Exam: 03/17/2021 Medical Rec #:  [id number]  Height:       65.0 in Accession #:    [id number]      Weight:       143.3 lb Date of Birth:  02-14-1941       BSA:          1.717 m Patient Age:    19 years        BP:           116/50 mmHg Patient Gender: M               HR:           114 bpm. Exam Location:  Inpatient Procedure: Cardiac Doppler, Color Doppler and Limited Echo                         STAT ECHO Reported to: Dr Gwyndolyn Kaufman on 03/17/2021 5:50:00 PM. Indications:    Hypotension  History:        Patient has no prior history of Echocardiogram examinations,                 most recent 01/05/2021.                  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  Sonographer:    Clayton Lefort RDCS (AE) Referring Phys: [id number] Darreld Mclean  Sonographer Comments: Image acquisition challenging due to respiratory motion. IMPRESSIONS  1. Left ventricular ejection fraction, by estimation, is 60 to 65%. The left ventricle has normal function. There is moderate concentric left ventricular hypertrophy.  2. Right ventricular systolic function is normal. The right ventricular size is normal.  3. The mitral valve is degenerative. Severe mitral annular calcification.  4. 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 Echo findings are consistent with normal structure and function of the aortic valve prosthesis. Aortic valve mean gradient measures 5.0 mmHg. Aortic valve Vmax measures 1.40 m/s. DI 0.8. There is no paravalvular leak.  5. The inferior vena cava is normal in size with <50% respiratory variability, suggesting right atrial pressure of 8 mmHg. Comparison(s): Compared to prior TTE on 01/2021, there is no significant change. TAVR valve continues to be well seated with current gradient 98mmHg (previously 40mmHg). FINDINGS  Left Ventricle: Left ventricular ejection fraction, by estimation, is 60 to 65%. The left ventricle has normal function. The left ventricular internal cavity size was normal in size. There is moderate concentric left ventricular hypertrophy. Right Ventricle: The right ventricular size is normal. Right ventricular systolic function is normal. Pericardium: There is no evidence of pericardial effusion. Mitral Valve: The mitral valve is degenerative in appearance. There is moderate thickening of the mitral valve leaflet(s). There is moderate calcification of the mitral valve leaflet(s). Severe mitral annular calcification. Tricuspid Valve: The tricuspid valve is  normal in structure. Tricuspid valve regurgitation is trivial. Aortic Valve: DI 0.8. No PVL. The aortic valve has been repaired/replaced. Aortic valve mean gradient measures 5.0 mmHg. Aortic valve peak gradient measures 7.8 mmHg. Aortic valve area, by VTI measures 2.67 cm. 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 Echo findings are consistent with normal structure and function of the aortic valve prosthesis. Pulmonic Valve: The pulmonic valve was not well visualized.  Pulmonic valve regurgitation is trivial. Aorta: The aortic root and ascending aorta are structurally normal, with no evidence of dilitation. Venous: The inferior vena cava is normal in size with less than 50% respiratory variability, suggesting right atrial pressure of 8 mmHg. LEFT VENTRICLE PLAX 2D LVIDd:         3.90 cm LVIDs:         3.00 cm LV PW:         1.30 cm LV IVS:        1.20 cm LVOT diam:     2.00 cm LV SV:         61 LV SV Index:   36 LVOT Area:     3.14 cm  LEFT ATRIUM           Index LA diam:      3.40 cm 1.98 cm/m LA Vol (A4C): 20.0 ml 11.65 ml/m  AORTIC VALVE AV Area (Vmax):    2.51 cm AV Area (Vmean):   2.53 cm AV Area (VTI):     2.67 cm AV Vmax:           140.00 cm/s AV Vmean:          104.000 cm/s AV VTI:            0.228 m AV Peak Grad:      7.8 mmHg AV Mean Grad:      5.0 mmHg LVOT Vmax:         112.00 cm/s LVOT Vmean:        83.900 cm/s LVOT VTI:          0.194 m LVOT/AV VTI ratio: 0.85  AORTA Ao Root diam: 2.50 cm Ao Asc diam:  2.60 cm  SHUNTS Systemic VTI:  0.19 m Systemic Diam: 2.00 cm Gwyndolyn Kaufman MD Electronically signed by Gwyndolyn Kaufman MD Signature Date/Time: 03/17/2021/6:28:04 PM    Final    [place] EKG SITE RITE  Result Date: 03/19/2021 If Site Rite image not attached, placement could not be confirmed due to current cardiac rhythm.   Labs:  CBC: Recent Labs    03/17/21 1701 03/18/21 0325 03/19/21 0255 03/20/21 0600  WBC 31.3* 25.8* 18.2* 12.1*  HGB  11.9* 11.3* 10.9* 8.5*  HCT 36.6* 34.9* 32.9* 25.4*  PLT 412* 421* 321 277    COAGS: No results for input(s): INR, APTT in the last 8760 hours.  BMP: Recent Labs    03/17/21 0328 03/17/21 1701 03/18/21 0325 03/20/21 0600  NA 137 137 138 135  K 6.0* 5.6* 5.5* 3.2*  CL 91* 93* 95* 96*  CO2 27 23 21* 24  GLUCOSE 140* 130* 118* 163*  BUN 64* 73* 76* 45*  CALCIUM 8.6* 8.5* 9.0 8.6*  CREATININE 9.84* 10.14* 10.40* 6.44*  GFRNONAA 5* 5* 5* 8*    LIVER FUNCTION TESTS: Recent Labs    03/15/21 0855 03/16/21 0545 03/16/21 2200 03/17/21 0328 03/17/21 1701 03/18/21 0325 03/20/21 0600  BILITOT 1.0 1.0  --  1.4*  --  1.0  --   AST 26 48*  --  57*  --  30  --   ALT 10 14  --  15  --  7  --   ALKPHOS 92 98  --  106  --  79  --   PROT 7.4 8.5*  --  7.1  --  6.3*  --   ALBUMIN 3.2* 2.6*   < > 2.4* 2.0* 2.0* 1.7*   < > = values in this  interval not displayed.    Assessment and Plan:  -S/p placement of 7F percutaneous transhepatic cholecystostomy (gallbladder) tube 03/16/21.  -Drain unremarkable with sutures/statlock in place. Dressing C/D/I. No redness, bleeding or drainage from site. No OP in gravity bag. Drain flushed but does not aspirate. Pt denies pain with flushing. 20 cc charted OP in Epic last 24 hours.  WBC down 12.1 from 18.2 yesterday  -Continue to flush drain q 8 hours w/10 cc NS -Record OP q shift -Change dressing q shift or PRN  Call IR with questions or concerns.   Electronically Signed: Tyson Alias, NP 03/20/2021, 11:43 AM   I spent a total of 20 minutes at the the patient's bedside AND on the patient's hospital floor or unit, greater than 50% of which was counseling/coordinating care for s/p placement of 7F percutaneous transhepatic cholecystostomy (gallbladder) tube 03/16/21.

## 2021-03-20 NOTE — Progress Notes (Signed)
Progress Note    Elijah White  HYI:[id number] DOB: 1941-02-26  DOA: 03/15/2021 PCP: Maryella Shivers, MD    Brief Narrative:    Medical records reviewed and are as summarized below:  Elijah White is an 81 y.o. male with medical history significant of ESRD on HD MWF, aortic stenosis status post TAVR 2021, nonobstructive CAD, Parkinson's disease, PVD on Plavix, s/p left BKA, HTN, IIDM, HLD,  came with recurrent right upper quadrant abdominal pain.  Thought to have cholecystitis and drain placed by IR.  Stay in hospital complicated by a fib with RVR, continued WBC count elevation and abdominal pain.  Found to have acute urinary retention/pyocystis needing placement of a foley.      Assessment/Plan:   Principal Problem:   Acute cholecystitis   Severe sepsis from pyocystis/acute cholecystitis  -finally starting to improve -continue IV abx  Acute urinary retention/UTI/hydronephrosis -IV abx -foley catheter- if pyocystis persists may need to change out catheter for 3 way to start irrigation -urology consult appreciated  Acute RUQ abdominal pain with leucocytosis -Secondary to cholelithiasis, suspect early acute cholecystitis with clinical Murphy sign positive. -IV abx (zosyn) -GS consult IR consult for percutaneous drain CT scan suggestive of perhaps a Right sided PNA-- on abx already-- continue pulmonary toilet  ESRD on HD -nephrology consulted.  -HD 1/14  A fib with RVR -dig x 1 -amiodarone started -cards consult much appreciated -old EKG reviewed again by cards and DOES NOT APPEAR to be mobitz type 2- more likely sinus with non-conducted PACs -heparin gtt per cards- can change to NOAC when appropriate   HTN -holding home meds due to hypotension   Parkinson's disease -continue Sinemet when able to take PO   Diabetic neuropathy -Continue gabapentin when able to take PO  Hyperglycemia -SSI for now   Family Communication/Anticipated D/C date and  plan/Code Status   DVT prophylaxis: heparin gtt Code Status: DNR Disposition Plan: Status is: Inpatient Spoke with son Remains inpatient appropriate because: needs IV abx         Medical Consultants:   IR GS Nephrology urology   Subjective:  Continues to improve, + cough with phlegm   Objective:    Vitals:   03/20/21 0044 03/20/21 0600 03/20/21 0754 03/20/21 1057  BP: (!) 98/42 (!) 95/53 (!) 82/59 (!) 110/53  Pulse: 62 68 68 67  Resp: 14 15 (!) 21 18  Temp: 97.7 F (36.5 C) 97.7 F (36.5 C) 98.8 F (37.1 C) 97.9 F (36.6 C)  TempSrc: Oral Oral Oral Oral  SpO2: 100% 100% 100% 99%  Weight:      Height:        Intake/Output Summary (Last 24 hours) at 03/20/2021 1227 Last data filed at 03/20/2021 0542 Gross per 24 hour  Intake 420 ml  Output 156 ml  Net 264 ml   Filed Weights   03/16/21 2045 03/18/21 1240 03/18/21 1554  Weight: 65 kg 61.8 kg 61.8 kg    Exam:    General: Appearance:    Well developed, well nourished male in no acute distress     Lungs:      respirations unlabored  Heart:    Normal heart rate.    MS:   Below knee amputation of left lower extremity is noted.  Neurologic:   Awake, alert             Data Reviewed:   I have personally reviewed following labs and imaging studies:  Labs: Labs show the following:  Basic Metabolic Panel: Recent Labs  Lab 03/16/21 2200 03/17/21 0328 03/17/21 1701 03/18/21 0325 03/20/21 0600  NA 139 137 137 138 135  K 6.1* 6.0* 5.6* 5.5* 3.2*  CL 88* 91* 93* 95* 96*  CO2 28 27 23  21* 24  GLUCOSE 170* 140* 130* 118* 163*  BUN 59* 64* 73* 76* 45*  CREATININE 9.69* 9.84* 10.14* 10.40* 6.44*  CALCIUM 9.2 8.6* 8.5* 9.0 8.6*  PHOS 6.6*  --  6.7*  --  4.0   GFR Estimated Creatinine Clearance: 7.7 mL/min (A) (by C-G formula based on SCr of 6.44 mg/dL (H)). Liver Function Tests: Recent Labs  Lab 03/15/21 0855 03/16/21 0545 03/16/21 2200 03/17/21 0328 03/17/21 1701 03/18/21 0325  03/20/21 0600  AST 26 48*  --  57*  --  30  --   ALT 10 14  --  15  --  7  --   ALKPHOS 92 98  --  106  --  79  --   BILITOT 1.0 1.0  --  1.4*  --  1.0  --   PROT 7.4 8.5*  --  7.1  --  6.3*  --   ALBUMIN 3.2* 2.6* 2.6* 2.4* 2.0* 2.0* 1.7*   Recent Labs  Lab 03/15/21 0855  LIPASE 20   No results for input(s): AMMONIA in the last 168 hours. Coagulation profile No results for input(s): INR, PROTIME in the last 168 hours.  CBC: Recent Labs  Lab 03/15/21 0855 03/16/21 0251 03/17/21 0328 03/17/21 1701 03/18/21 0325 03/19/21 0255 03/20/21 0600  WBC 19.1*   < > 44.0*   42.7* 31.3* 25.8* 18.2* 12.1*  NEUTROABS 16.9*  --  40.8*  --   --   --   --   HGB 13.8   < > 13.6   13.2 11.9* 11.3* 10.9* 8.5*  HCT 42.1   < > 39.7   40.3 36.6* 34.9* 32.9* 25.4*  MCV 106.3*   < > 105.0*   105.5* 104.9* 105.1* 103.8* 102.8*  PLT 414*   < > 455*   442* 412* 421* 321 277   < > = values in this interval not displayed.   Cardiac Enzymes: No results for input(s): CKTOTAL, CKMB, CKMBINDEX, TROPONINI in the last 168 hours. BNP (last 3 results) No results for input(s): PROBNP in the last 8760 hours. CBG: Recent Labs  Lab 03/19/21 2105 03/20/21 0042 03/20/21 0557 03/20/21 0726 03/20/21 1056  GLUCAP 161* 160* 181* 93 144*   D-Dimer: No results for input(s): DDIMER in the last 72 hours. Hgb A1c: No results for input(s): HGBA1C in the last 72 hours. Lipid Profile: No results for input(s): CHOL, HDL, LDLCALC, TRIG, CHOLHDL, LDLDIRECT in the last 72 hours. Thyroid function studies: No results for input(s): TSH, T4TOTAL, T3FREE, THYROIDAB in the last 72 hours.  Invalid input(s): FREET3 Anemia work up: No results for input(s): VITAMINB12, FOLATE, FERRITIN, TIBC, IRON, RETICCTPCT in the last 72 hours. Sepsis Labs: Recent Labs  Lab 03/16/21 2323 03/17/21 0328 03/17/21 1701 03/18/21 0325 03/19/21 0255 03/20/21 0600  WBC  --  44.0*   42.7* 31.3* 25.8* 18.2* 12.1*  LATICACIDVEN 2.1* 1.8  1.2  --   --   --     Microbiology Recent Results (from the past 240 hour(s))  Resp Panel by RT-PCR (Flu A&B, Covid) Nasopharyngeal Swab     Status: None   Collection Time: 03/15/21  1:35 PM   Specimen: Nasopharyngeal Swab; Nasopharyngeal(NP) swabs in vial transport medium  Result Value Ref Range  Status   SARS Coronavirus 2 by RT PCR NEGATIVE NEGATIVE Final    Comment: (NOTE) SARS-CoV-2 target nucleic acids are NOT DETECTED.  The SARS-CoV-2 RNA is generally detectable in upper respiratory specimens during the acute phase of infection. The lowest concentration of SARS-CoV-2 viral copies this assay can detect is 138 copies/mL. A negative result does not preclude SARS-Cov-2 infection and should not be used as the sole basis for treatment or other patient management decisions. A negative result may occur with  improper specimen collection/handling, submission of specimen other than nasopharyngeal swab, presence of viral mutation(s) within the areas targeted by this assay, and inadequate number of viral copies(<138 copies/mL). A negative result must be combined with clinical observations, patient history, and epidemiological information. The expected result is Negative.  Fact Sheet for Patients:  EntrepreneurPulse.com.au  Fact Sheet for Healthcare Providers:  IncredibleEmployment.be  This test is no t yet approved or cleared by the [place] FDA and  has been authorized for detection and/or diagnosis of SARS-CoV-2 by FDA under an Emergency Use Authorization (EUA). This EUA will remain  in effect (meaning this test can be used) for the duration of the COVID-19 declaration under Section 564(b)(1) of the Act, 21 U.S.C.section 360bbb-3(b)(1), unless the authorization is terminated  or revoked sooner.       Influenza A by PCR NEGATIVE NEGATIVE Final   Influenza B by PCR NEGATIVE NEGATIVE Final    Comment: (NOTE) The Xpert Xpress  SARS-CoV-2/FLU/RSV plus assay is intended as an aid in the diagnosis of influenza from Nasopharyngeal swab specimens and should not be used as a sole basis for treatment. Nasal washings and aspirates are unacceptable for Xpert Xpress SARS-CoV-2/FLU/RSV testing.  Fact Sheet for Patients: EntrepreneurPulse.com.au  Fact Sheet for Healthcare Providers: IncredibleEmployment.be  This test is not yet approved or cleared by the [place] FDA and has been authorized for detection and/or diagnosis of SARS-CoV-2 by FDA under an Emergency Use Authorization (EUA). This EUA will remain in effect (meaning this test can be used) for the duration of the COVID-19 declaration under Section 564(b)(1) of the Act, 21 U.S.C. section 360bbb-3(b)(1), unless the authorization is terminated or revoked.  Performed at [facility], [place] [address], [place], [place] [address]   Body fluid culture w Gram Stain     Status: None   Collection Time: 03/16/21  8:16 AM   Specimen: BILE; Body Fluid  Result Value Ref Range Status   Specimen Description BILE  Final   Special Requests BILE  Final   Gram Stain NO WBC SEEN NO ORGANISMS SEEN   Final   Culture   Final    NO GROWTH 3 DAYS Performed at [facility], [place] [address], [place], [place] [address]    Report Status 03/19/2021 FINAL  Final  Culture, blood (routine x 2)     Status: None (Preliminary result)   Collection Time: 03/17/21  3:07 AM   Specimen: BLOOD RIGHT HAND  Result Value Ref Range Status   Specimen Description BLOOD RIGHT HAND  Final   Special Requests AEROBIC BOTTLE ONLY Blood Culture adequate volume  Final   Culture   Final    NO GROWTH 3 DAYS Performed at [facility], [place] [address], [place], [place] [address]    Report Status PENDING  Incomplete  Culture, blood (routine x 2)     Status: None (Preliminary result)   Collection Time: 03/17/21  3:17 AM   Specimen: BLOOD RIGHT HAND  Result Value Ref Range Status   Specimen Description BLOOD RIGHT HAND  Final   Special Requests   Final    AEROBIC BOTTLE ONLY Blood Culture results may not be optimal due to an inadequate volume of blood received in culture bottles   Culture   Final    NO GROWTH 3 DAYS Performed at [place] [address] [place], [place] [address]    Report Status PENDING  Incomplete  Urine Culture     Status: None   Collection Time: 03/18/21 10:30 AM   Specimen: In/Out Cath Urine  Result Value Ref Range Status   Specimen Description IN/OUT CATH URINE  Final   Special Requests NONE  Final   Culture   Final    NO GROWTH Performed at [facility], [place] [address], [place], [place] [address]    Report Status 03/19/2021 FINAL  Final    Procedures and diagnostic studies:  [place] EKG SITE RITE  Result Date: 03/19/2021 If Site Rite image not attached, placement could not be confirmed due to current cardiac rhythm.   Medications:    (feeding supplement) PROSource Plus  30 mL Oral BID BM   amiodarone  400 mg Oral BID   apixaban  2.5 mg Oral BID   brimonidine  1 drop Right Eye TID   carbidopa-levodopa  1 tablet Oral TID   Chlorhexidine Gluconate Cloth  6 each Topical Q0600   cinacalcet  60 mg Oral Q M,W,F-HD   darbepoetin (ARANESP) injection - DIALYSIS  40 mcg Intravenous Q Mon-HD   doxercalciferol  2 mcg Intravenous Q M,W,F-HD   insulin aspart  0-6 Units Subcutaneous Q4H   latanoprost  1 drop Right Eye QHS   pantoprazole (PROTONIX) IV  40 mg Intravenous Q24H   pilocarpine  1 drop Right Eye QID   sevelamer carbonate  1,600 mg Oral TID WC   sodium chloride flush  10-40 mL Intracatheter Q12H   Continuous Infusions:  sodium chloride     sodium chloride     sodium chloride 50 mL/hr at 03/20/21 0538   piperacillin-tazobactam (ZOSYN)  IV 2.25 g (03/20/21 0542)     LOS: 5 days   Geradine Girt  [facility]   How to contact the [facility] Attending or Consulting provider [place] or covering provider during after hours [place], for this patient?  Check the care team in [facility] and look for a) attending/consulting TRH provider listed and b) the [facility] team listed Log into [email or web address] and use [place]'s universal password to access. If you do not have the password, please contact the hospital operator. Locate the [facility] provider you are looking for under [facility] and page to a number that you can be directly reached. If you still have difficulty reaching the provider, please page the [facility] (Director on Call) for the Hospitalists listed on amion for assistance.  03/20/2021, 12:27 PM

## 2021-03-20 NOTE — Progress Notes (Signed)
Progress Note  Patient Name: Elijah White Date of Encounter: 03/20/2021  [facility] Cardiologist: Evalina Field, MD   Subjective   Pt appears comfortable in bed    Denies CP   Says his breathing is OK   Inpatient Medications    Scheduled Meds:  (feeding supplement) PROSource Plus  30 mL Oral BID BM   amiodarone  400 mg Oral BID   apixaban  2.5 mg Oral BID   brimonidine  1 drop Right Eye TID   carbidopa-levodopa  1 tablet Oral TID   Chlorhexidine Gluconate Cloth  6 each Topical Q0600   cinacalcet  60 mg Oral Q M,W,F-HD   doxercalciferol  2 mcg Intravenous Q M,W,F-HD   insulin aspart  0-6 Units Subcutaneous Q4H   latanoprost  1 drop Right Eye QHS   pantoprazole (PROTONIX) IV  40 mg Intravenous Q24H   pilocarpine  1 drop Right Eye QID   sevelamer carbonate  1,600 mg Oral TID WC   sodium chloride flush  10-40 mL Intracatheter Q12H   Continuous Infusions:  sodium chloride     sodium chloride     sodium chloride 50 mL/hr at 03/20/21 0538   piperacillin-tazobactam (ZOSYN)  IV 2.25 g (03/20/21 0542)   PRN Meds: sodium chloride, sodium chloride, acetaminophen, HYDROmorphone (DILAUDID) injection, ipratropium-albuterol, lidocaine (PF), lidocaine-prilocaine, ondansetron **OR** ondansetron (ZOFRAN) IV, pentafluoroprop-tetrafluoroeth, sodium chloride flush   Vital Signs    Vitals:   03/19/21 1603 03/19/21 2039 03/20/21 0044 03/20/21 0600  BP: (!) 138/59 (!) 100/50 (!) 98/42 (!) 95/53  Pulse: 76 86 62 68  Resp: 17 16 14 15   Temp: 97.7 F (36.5 C)  97.7 F (36.5 C) 97.7 F (36.5 C)  TempSrc: Oral  Oral Oral  SpO2: 98% 100% 100% 100%  Weight:      Height:        Intake/Output Summary (Last 24 hours) at 03/20/2021 0738 Last data filed at 03/20/2021 0542 Gross per 24 hour  Intake 420 ml  Output 156 ml  Net 264 ml   Last 3 Weights 03/18/2021 03/18/2021 03/16/2021  Weight (lbs) 136 lb 3.9 oz 136 lb 3.9 oz 143 lb 4.8 oz  Weight (kg) 61.8 kg 61.8 kg 65 kg       Telemetry   SR 60s to 80s   - Personally Reviewed  ECG    No new ecg today - Personally Reviewed  Physical Exam   Vitals:   03/20/21 0044 03/20/21 0600  BP: (!) 98/42 (!) 95/53  Pulse: 62 68  Resp: 14 15  Temp: 97.7 F (36.5 C) 97.7 F (36.5 C)  SpO2: 100% 100%    GEN: no distress Neck: No sig JVD Cardiac: RRR, no murmurs,  Respiratory: Clear to auscultation bilaterally. GI: Soft, nontender, non-distended right perc drain in place MS: No edema; No deformity. Skin: warm and well perfused    Labs    High Sensitivity Troponin:  No results for input(s): TROPONINIHS in the last 720 hours.   Chemistry Recent Labs  Lab 03/16/21 0545 03/16/21 2200 03/17/21 0328 03/17/21 1701 03/18/21 0325 03/20/21 0600  NA 139   < > 137 137 138 135  K 5.4*   < > 6.0* 5.6* 5.5* 3.2*  CL 85*   < > 91* 93* 95* 96*  CO2 29   < > 27 23 21* 24  GLUCOSE 249*   < > 140* 130* 118* 163*  BUN 41*   < > 64* 73* 76* 45*  CREATININE 8.56*   < > 9.84* 10.14* 10.40* 6.44*  CALCIUM 9.2   < > 8.6* 8.5* 9.0 8.6*  PROT 8.5*  --  7.1  --  6.3*  --   ALBUMIN 2.6*   < > 2.4* 2.0* 2.0* 1.7*  AST 48*  --  57*  --  30  --   ALT 14  --  15  --  7  --   ALKPHOS 98  --  106  --  79  --   BILITOT 1.0  --  1.4*  --  1.0  --   GFRNONAA 6*   < > 5* 5* 5* 8*  ANIONGAP 25*   < > 19* 21* 22* 15   < > = values in this interval not displayed.    Lipids No results for input(s): CHOL, TRIG, HDL, LABVLDL, LDLCALC, CHOLHDL in the last 168 hours.  Hematology Recent Labs  Lab 03/18/21 0325 03/19/21 0255 03/20/21 0600  WBC 25.8* 18.2* 12.1*  RBC 3.32* 3.17* 2.47*  HGB 11.3* 10.9* 8.5*  HCT 34.9* 32.9* 25.4*  MCV 105.1* 103.8* 102.8*  MCH 34.0 34.4* 34.4*  MCHC 32.4 33.1 33.5  RDW 14.2 14.3 14.5  PLT 421* 321 277   Thyroid No results for input(s): TSH, FREET4 in the last 168 hours.  BNPNo results for input(s): BNP, PROBNP in the last 168 hours.  DDimer No results for input(s): DDIMER in the last 168  hours.   Radiology    [place] EKG SITE RITE  Result Date: 03/19/2021 If Site Rite image not attached, placement could not be confirmed due to current cardiac rhythm.   Cardiac Studies    TTE 03/17/2021 1. Left ventricular ejection fraction, by estimation, is 60 to 65%. The  left ventricle has normal function. There is moderate concentric left  ventricular hypertrophy.   2. Right ventricular systolic function is normal. The right ventricular  size is normal.   3. The mitral valve is degenerative. Severe mitral annular calcification.   4. The aortic valve has been repaired/replaced. There is a 26 mm Sapien  prosthetic (TAVR) valve present in the aortic position. Echo findings are  consistent with normal structure and function of the aortic valve  prosthesis. Aortic valve mean gradient  measures 5.0 mmHg. Aortic valve Vmax measures 1.40 m/s. DI 0.8. There is  no paravalvular leak.   5. The inferior vena cava is normal in size with <50% respiratory  variability, suggesting right atrial pressure of 8 mmHg.   Comparison(s): Compared to prior TTE on 01/2021, there is no significant  change. TAVR valve continues to be well seated with current gradient 28mmHg  (previously 77mmHg).    CHA2DS2-VASc Score = 4   This indicates a 4.8% annual risk of stroke. The patient's score is based upon: CHF History: 0 HTN History: 1 Diabetes History: 1 Stroke History: 0 Vascular Disease History: 0 Age Score: 2 Gender Score: 0      Patient Profile     Elijah White is an 81 y.o. male with medical history significant of ESRD on HD MWF, aortic stenosis status post TAVR 2021, nonobstructive CAD, Parkinson's disease, PAD on Plavix, s/p left BKA, HTN, IIDM, HLD,  came with recurrent right upper quadrant abdominal pain.  Thought to have cholecystitis and drain placed by IR.  Stay in hospital complicated by a fib with RVR,  sepsis  2/2 acute cholecystitis ; bld cultures pending s/p perc drain on IV zosyn now  in sinus rhythm on  amio gtt  Assessment & Plan    #New Onset Atrial Fibrillation: Chads2vasc4. New onset in the setting of acute chole. He's not a good candidate for sotalol/dofetilide considering this requires a load and close Qtc follow up. Rate control was attempted but he had significant RVR with hypotension - converted to sinus rhythm 1/14  Plan   AMio 400 bid for 1 week then 200 daily    Keep on  - can continue amiodarone load 400 mg BID for 1 week, 200 mg daily there after - can likely continue with amiodarone long-term considering age/dementia  - heparin gtt for now , can convert to [facility] prior to DC   #PAD: s/p L BKA - can hold plavix for today; and if tolerating AC can restart tomorrow - can continue home statin  #S/p 26 mm Sapien prosthetic (TAVR) - Well seated. Normal mean gradient     For questions or updates, please contact [place] Please consult [email or web address] for contact info under        Signed, [name], MD  03/20/2021, 7:38 AM

## 2021-03-21 ENCOUNTER — Ambulatory Visit: Payer: Medicare Other

## 2021-03-21 ENCOUNTER — Encounter (HOSPITAL_COMMUNITY): Payer: Medicare Other

## 2021-03-21 ENCOUNTER — Inpatient Hospital Stay (HOSPITAL_COMMUNITY): Payer: Medicare Other

## 2021-03-21 DIAGNOSIS — N390 Urinary tract infection, site not specified: Secondary | ICD-10-CM

## 2021-03-21 DIAGNOSIS — K810 Acute cholecystitis: Secondary | ICD-10-CM | POA: Diagnosis not present

## 2021-03-21 HISTORY — PX: IR REMOVAL BILIARY DRAIN: IMG6047

## 2021-03-21 HISTORY — PX: IR SINUS/FIST TUBE CHK-NON GI: IMG673

## 2021-03-21 HISTORY — PX: IR CHOLANGIOGRAM EXISTING TUBE: IMG6040

## 2021-03-21 LAB — CBC
HCT: 27.7 % — ABNORMAL LOW (ref 39.0–52.0)
Hemoglobin: 9.4 g/dL — ABNORMAL LOW (ref 13.0–17.0)
MCH: 35.1 pg — ABNORMAL HIGH (ref 26.0–34.0)
MCHC: 33.9 g/dL (ref 30.0–36.0)
MCV: 103.4 fL — ABNORMAL HIGH (ref 80.0–100.0)
Platelets: 288 10*3/uL (ref 150–400)
RBC: 2.68 MIL/uL — ABNORMAL LOW (ref 4.22–5.81)
RDW: 14.8 % (ref 11.5–15.5)
WBC: 13.2 10*3/uL — ABNORMAL HIGH (ref 4.0–10.5)
nRBC: 0 % (ref 0.0–0.2)

## 2021-03-21 LAB — PATHOLOGIST SMEAR REVIEW

## 2021-03-21 LAB — RENAL FUNCTION PANEL
Albumin: 1.6 g/dL — ABNORMAL LOW (ref 3.5–5.0)
Anion gap: 13 (ref 5–15)
BUN: 36 mg/dL — ABNORMAL HIGH (ref 8–23)
CO2: 25 mmol/L (ref 22–32)
Calcium: 8.3 mg/dL — ABNORMAL LOW (ref 8.9–10.3)
Chloride: 98 mmol/L (ref 98–111)
Creatinine, Ser: 4.34 mg/dL — ABNORMAL HIGH (ref 0.61–1.24)
GFR, Estimated: 13 mL/min — ABNORMAL LOW (ref >60)
Glucose, Bld: 133 mg/dL — ABNORMAL HIGH (ref 70–99)
Phosphorus: 2.5 mg/dL (ref 2.5–4.6)
Potassium: 3.3 mmol/L — ABNORMAL LOW (ref 3.5–5.1)
Sodium: 136 mmol/L (ref 135–145)

## 2021-03-21 LAB — GLUCOSE, CAPILLARY
Glucose-Capillary: 119 mg/dL — ABNORMAL HIGH (ref 70–99)
Glucose-Capillary: 96 mg/dL (ref 70–99)
Glucose-Capillary: 155 mg/dL — ABNORMAL HIGH (ref 70–99)
Glucose-Capillary: 130 mg/dL — ABNORMAL HIGH (ref 70–99)
Glucose-Capillary: 111 mg/dL — ABNORMAL HIGH (ref 70–99)

## 2021-03-21 MED ORDER — CEFDINIR 300 MG PO CAPS
300.0000 mg | ORAL_CAPSULE | ORAL | Status: DC
  Administered 2021-03-22 – 2021-03-24 (×2): 300 mg via ORAL
  Filled 2021-03-21 (×2): qty 1

## 2021-03-21 MED ORDER — IOHEXOL 300 MG/ML  SOLN
100.0000 mL | Freq: Once | INTRAMUSCULAR | Status: AC | PRN
Start: 2021-03-21 — End: 2021-03-21
  Administered 2021-03-21: 10 mL

## 2021-03-21 MED ORDER — CEFDINIR 300 MG PO CAPS
300.0000 mg | ORAL_CAPSULE | Freq: Once | ORAL | Status: AC
  Administered 2021-03-21: 300 mg via ORAL
  Filled 2021-03-21: qty 1

## 2021-03-21 MED ORDER — CEFDINIR 300 MG PO CAPS
300.0000 mg | ORAL_CAPSULE | Freq: Two times a day (BID) | ORAL | Status: DC
  Filled 2021-03-21 (×2): qty 1

## 2021-03-21 NOTE — Consult Note (Signed)
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  81 yo male with blindness, esrd (ihd via lue fistula), hx prostate cancer s/p brachytherapy, aortic stenosis, DM2 admitted in transfer from [facility] on 1/11 for failure to thrive picture/abd pain found to have complicated uti with obstructive hydronephrosis  Patient had 1 day abx prior to urine cx and IR perc drain placement of the distended gall bladder -- all cx including bcx negative so far  In retrospect, with the appearance of gross pus from placement of foley catheter and distended bladder with obstructive hydronephrosis that is slightly worse from previous, I suspect this has been a gu infectious process  He has distended gall bladder but no sonographic evidence of murphy sign and initial labs do not suggest a cholangitis/cholecystitis process. He had a drain that is not currently putting any further pending removal. The 7 day of piptazo he had should be more than sufficient for the process anyway  At this time, would continue treatment for complicated uti of 10 days from 1/15   Plan: Agree transition to oral antibiotics for complicated uti -- omnicef renal dosing until 1/25 Agree with prompt follow up urology outpatient Would consider perc gall-bladder drain removal per IR  Would consider picc removal to avoid CLABSI Discussed with primary team      ------------------------------------------------ Principal Problem:   Acute cholecystitis    HPI: Elijah White is a 81 y.o. male with blindness, esrd, s/p tavr, cad, parkinson, pvd, dm2, hx left bka, admitted from  nursing home for persistent abd pain  Hx via chart review. Patient doesn't remember at all the context of how he came for admission  Sx onset 1 day pta, including nausea. No subjective f/c. 1 episode of vomiting prior to admission  No chest pain/cough/sob, diarrhea.   Gu sx were not mentioned  On admission significant leukocytosis but no fever. Hemodynamics stable Lft not significantly elevated Abd imaging suggest distended gall bladder; ruq u/s without sonographic u/s 1/12 IR placement perc gallbladder drain "dark bile." S/p removal 1/17 Received abx 1 day prior to blood cx and perc drain placement cx and 2 days prior to urine cx Urology also ultimately consulted for distended urinary bladder and bilateral R>L hydronephrosis. A foley was placed which drained gross purulent  Clinically has been improving since HD#2  Eating well Again doesn't know what is going on Mild insertion site pain of the ruq area   Family History  Problem Relation Age of Onset   CAD Mother     Social History   Tobacco Use   Smoking status: Former    Packs/day: 0.50    Types: Cigarettes    Quit date: 03/23/2018    Years since quitting: 2.9   Smokeless tobacco: Never  Vaping Use   Vaping Use: Never used  Substance Use Topics   Alcohol use: No   Drug use: No    No Known Allergies  Review of Systems: ROS All Other ROS was negative, except mentioned above   Past Medical History:  Diagnosis Date   Anemia  low iron   Aortic stenosis    s/p TAVR 01/19/20   Arthritis    Cancer [facility])    prostate, s/p I-125 seed implant 05/18/05   Colon polyps ~ 1993 and 2003   Dr [name], [facility].  08/2001 colonoscopy: tubular adenoma at cecum.     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   Elevated cholesterol with high triglycerides    ESRD (end stage renal disease) (HCC)    [facility]   GERD (gastroesophageal reflux disease)    Glaucoma    Hypertension     Legally blind in left eye, as defined in [place]    has pinpoint vision in right eye   PAD (peripheral artery disease) ([place])    RBBB        Scheduled Meds:  (feeding supplement) PROSource Plus  30 mL Oral BID BM   amiodarone  400 mg Oral BID   apixaban  2.5 mg Oral BID   brimonidine  1 drop Right Eye TID   carbidopa-levodopa  1 tablet Oral TID   [START ON 03/22/2021] cefdinir  300 mg Oral Q M,W,F-1800   cefdinir  300 mg Oral Once   Chlorhexidine Gluconate Cloth  6 each Topical Q0600   cinacalcet  60 mg Oral Q M,W,F-HD   darbepoetin (ARANESP) injection - DIALYSIS  40 mcg Intravenous Q Mon-HD   doxercalciferol  2 mcg Intravenous Q M,W,F-HD   insulin aspart  0-6 Units Subcutaneous Q4H   latanoprost  1 drop Right Eye QHS   pantoprazole (PROTONIX) IV  40 mg Intravenous Q24H   pilocarpine  1 drop Right Eye QID   sevelamer carbonate  1,600 mg Oral TID WC   sodium chloride flush  10-40 mL Intracatheter Q12H   Continuous Infusions: PRN Meds:.acetaminophen, ipratropium-albuterol, ondansetron **OR** ondansetron (ZOFRAN) IV, sodium chloride flush   OBJECTIVE: Blood pressure (!) 95/48, pulse 79, temperature 98.5 F (36.9 C), temperature source Oral, resp. rate 17, height 5\' 5"  (1.651 m), weight 62.1 kg, SpO2 100 %.  Physical Exam  General/constitutional: no distress, pleasant; legally blilnd HEENT: Normocephalic, PER, Conj Clear, poor dentition CV: rrr no mrg Lungs: clear to auscultation, normal respiratory effort Abd: Soft, Nontender Ext: no edema Skin: No Rash; lue fistula with good thrill Neuro: nonfocal MSK: s/p left bka; no peripheral joint tenderness/swelling   Central line presence: rue picc site no erythema/purulence/tenderness   Lab Results Lab Results  Component Value Date   WBC 13.2 (H) 03/21/2021   HGB 9.4 (L) 03/21/2021   HCT 27.7 (L) 03/21/2021   MCV 103.4 (H) 03/21/2021   PLT 288 03/21/2021    Lab Results  Component Value Date   CREATININE 4.34 (H)  03/21/2021   BUN 36 (H) 03/21/2021   NA 136 03/21/2021   K 3.3 (L) 03/21/2021   CL 98 03/21/2021   CO2 25 03/21/2021    Lab Results  Component Value Date   ALT 7 03/18/2021   AST 30 03/18/2021   ALKPHOS 79 03/18/2021   BILITOT 1.0 03/18/2021      Microbiology: Recent Results (from the past 240 hour(s))  Resp Panel by RT-PCR (Flu A&B, Covid) Nasopharyngeal Swab     Status: None   Collection Time: 03/15/21  1:35 PM   Specimen: Nasopharyngeal Swab; Nasopharyngeal(NP) swabs in vial transport medium  Result Value Ref Range Status   SARS Coronavirus 2 by RT PCR NEGATIVE NEGATIVE Final    Comment: (NOTE) SARS-CoV-2 target nucleic  acids are NOT DETECTED.  The SARS-CoV-2 RNA is generally detectable in upper respiratory specimens during the acute phase of infection. The lowest concentration of SARS-CoV-2 viral copies this assay can detect is 138 copies/mL. A negative result does not preclude SARS-Cov-2 infection and should not be used as the sole basis for treatment or other patient management decisions. A negative result may occur with  improper specimen collection/handling, submission of specimen other than nasopharyngeal swab, presence of viral mutation(s) within the areas targeted by this assay, and inadequate number of viral copies(<138 copies/mL). A negative result must be combined with clinical observations, patient history, and epidemiological information. The expected result is Negative.  Fact Sheet for Patients:  EntrepreneurPulse.com.au  Fact Sheet for Healthcare Providers:  IncredibleEmployment.be  This test is no t yet approved or cleared by the [place] FDA and  has been authorized for detection and/or diagnosis of SARS-CoV-2 by FDA under an Emergency Use Authorization (EUA). This EUA will remain  in effect (meaning this test can be used) for the duration of the COVID-19 declaration under Section 564(b)(1) of the Act,  21 U.S.C.section 360bbb-3(b)(1), unless the authorization is terminated  or revoked sooner.       Influenza A by PCR NEGATIVE NEGATIVE Final   Influenza B by PCR NEGATIVE NEGATIVE Final    Comment: (NOTE) The Xpert Xpress SARS-CoV-2/FLU/RSV plus assay is intended as an aid in the diagnosis of influenza from Nasopharyngeal swab specimens and should not be used as a sole basis for treatment. Nasal washings and aspirates are unacceptable for Xpert Xpress SARS-CoV-2/FLU/RSV testing.  Fact Sheet for Patients: EntrepreneurPulse.com.au  Fact Sheet for Healthcare Providers: IncredibleEmployment.be  This test is not yet approved or cleared by the [place] FDA and has been authorized for detection and/or diagnosis of SARS-CoV-2 by FDA under an Emergency Use Authorization (EUA). This EUA will remain in effect (meaning this test can be used) for the duration of the COVID-19 declaration under Section 564(b)(1) of the Act, 21 U.S.C. section 360bbb-3(b)(1), unless the authorization is terminated or revoked.  Performed at [facility], [employer] [address] [place], [place] [address]   Body fluid culture w Gram Stain     Status: None   Collection Time: 03/16/21  8:16 AM   Specimen: BILE; Body Fluid  Result Value Ref Range Status   Specimen Description BILE  Final   Special Requests BILE  Final   Gram Stain NO WBC SEEN NO ORGANISMS SEEN   Final   Culture   Final    NO GROWTH 3 DAYS Performed at [facility], [place] [address], [place], [place] [address]    Report Status 03/19/2021 FINAL  Final  Culture, blood (routine x 2)     Status: None (Preliminary result)   Collection Time: 03/17/21  3:07 AM   Specimen: BLOOD RIGHT HAND  Result Value Ref Range Status   Specimen Description BLOOD RIGHT HAND  Final   Special Requests AEROBIC BOTTLE ONLY Blood Culture adequate volume  Final   Culture   Final    NO GROWTH 4 DAYS Performed at [facility], [place] [address] [place], [place] [address]    Report Status PENDING  Incomplete  Culture, blood (routine x 2)     Status: None (Preliminary result)   Collection Time: 03/17/21  3:17 AM   Specimen: BLOOD RIGHT HAND  Result Value Ref Range Status   Specimen Description BLOOD RIGHT HAND  Final   Special Requests  Final    AEROBIC BOTTLE ONLY Blood Culture results may not be optimal due to an inadequate volume of blood received in culture bottles   Culture   Final    NO GROWTH 4 DAYS Performed at [facility] [address], [place], [place] [address]    Report Status PENDING  Incomplete  Urine Culture     Status: None   Collection Time: 03/18/21 10:30 AM   Specimen: In/Out Cath Urine  Result Value Ref Range Status   Specimen Description IN/OUT CATH URINE  Final   Special Requests NONE  Final   Culture   Final    NO GROWTH Performed at [facility], [place] [address], [place], [place] [address]    Report Status 03/19/2021 FINAL  Final     Serology:    Imaging: If present, new imagings (plain films, ct scans, and mri) have been personally visualized and interpreted; radiology reports have been reviewed. Decision making incorporated into the Impression / Recommendations.  1/11 cxr Mild right lower lobe opacity  1/11 abdominal RUQ u/s Distended gallbladder with sludge and calculi but no additional sonographic evidence of acute cholecystitis.  1/11 ct renal protocol 1. No nephroureterolithiasis or hydronephrosis. 2. Cholelithiasis. 3. 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 4. Small fat containing umbilical and left inguinal hernias. 5. Trace right pleural effusion with mild subsegmental atelectasis. 6.  Aortic Atherosclerosis  1/13 ct abd/pelv Interval development of right lower lobe opacity is noted concerning for pneumonia or atelectasis.   Interval placement of percutaneous cholecystostomy tube in grossly good  position. Stable gallbladder distension and cholelithiasis is noted.   Grossly stable moderate right hydroureteronephrosis is noted as well as prominent left renal pelvis and mild distal left ureteral dilatation, without evidence of obstructing calculus. Mild urinary bladder distention is noted.   Status post prostatic brachytherapy seed placement.   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   Aortic Atherosclerosis   1/13 cxr  No acute lung pathology  Jabier Mutton, [place] [facility] [place] [phone] pager    03/21/2021, 2:05 PM

## 2021-03-21 NOTE — Progress Notes (Signed)
Subjective: CC: Reports he is feeling better. Tolerating diet without n/v. Currently on dys1 diet. SLP following. No abdominal pain. IR following drain. Scant bilious output currently. Afebrile. WBC downtrending. LFT's normalized 1/14  Objective: Vital signs in last 24 hours: Temp:  [97.6 F (36.4 C)-98.9 F (37.2 C)] 98.9 F (37.2 C) (01/17 0748) Pulse Rate:  [62-88] 88 (01/17 0748) Resp:  [14-22] 16 (01/17 0748) BP: (91-139)/(22-58) 91/58 (01/17 0748) SpO2:  [98 %-100 %] 99 % (01/17 0748) Weight:  [62.1 kg] 62.1 kg (01/16 1421) Last BM Date: 03/19/21  Intake/Output from previous day: 01/16 0701 - 01/17 0700 In: 240 [P.O.:240] Out: 0  Intake/Output this shift: No intake/output data recorded.  PE: Gen:  Alert, NAD, pleasant Abd: Soft, ND, NT +BS, IR perc chole drain with scant/residual bilious output in gravity bag.   Lab Results:  Recent Labs    03/20/21 0600 03/21/21 0840  WBC 12.1* 13.2*  HGB 8.5* 9.4*  HCT 25.4* 27.7*  PLT 277 288   BMET Recent Labs    03/20/21 0600  NA 135  K 3.2*  CL 96*  CO2 24  GLUCOSE 163*  BUN 45*  CREATININE 6.44*  CALCIUM 8.6*   PT/INR No results for input(s): LABPROT, INR in the last 72 hours. CMP     Component Value Date/Time   NA 135 03/20/2021 0600   NA 134 (A) 09/11/2019 0000   K 3.2 (L) 03/20/2021 0600   CL 96 (L) 03/20/2021 0600   CO2 24 03/20/2021 0600   GLUCOSE 163 (H) 03/20/2021 0600   BUN 45 (H) 03/20/2021 0600   BUN 34 (A) 09/11/2019 0000   CREATININE 6.44 (H) 03/20/2021 0600   CALCIUM 8.6 (L) 03/20/2021 0600   PROT 6.3 (L) 03/18/2021 0325   PROT 6.9 02/19/2018 1014   ALBUMIN 1.7 (L) 03/20/2021 0600   ALBUMIN 4.2 02/19/2018 1014   AST 30 03/18/2021 0325   ALT 7 03/18/2021 0325   ALKPHOS 79 03/18/2021 0325   BILITOT 1.0 03/18/2021 0325   BILITOT 0.5 02/19/2018 1014   GFRNONAA 8 (L) 03/20/2021 0600   GFRAA 7 (L) 12/03/2019 0710   Lipase     Component Value Date/Time   LIPASE 20 03/15/2021  0855    Studies/Results: [place] EKG SITE RITE  Result Date: 03/19/2021 If Site Rite image not attached, placement could not be confirmed due to current cardiac rhythm.   Anti-infectives: Anti-infectives (From admission, onward)    Start     Dose/Rate Route Frequency Ordered Stop   03/15/21 1400  piperacillin-tazobactam (ZOSYN) IVPB 2.25 g        2.25 g 100 mL/hr over 30 Minutes Intravenous Every 8 hours 03/15/21 1326     03/15/21 1330  Ampicillin-Sulbactam (UNASYN) 3 g in sodium chloride 0.9 % 100 mL IVPB  Status:  Discontinued        3 g 200 mL/hr over 30 Minutes Intravenous Every evening 03/15/21 1324 03/15/21 1326        Assessment/Plan Acute cholecystitis - S/p perc chole drain 1/12 - CT 1/13 w/ drain in good position - LFT's normalized 1/14 - WBC has been overall downtrending (44 > 31 > 25 > 18 > 12 > 13) - Cont abx for 10-14 days from our standpoint. There was question of R PNA on imaging which I will defer tx to [facility] - Patient tolerating diet without abdominal pain or n/v. We will sign off. Needs follow up with IR. We have arranged follow  up in our office.    ID - Zosyn VTE - SCDs; Eliquis, ok to restart plavix. Hgb stable at 9.4 today from 8.5 FEN - Dys1. Can adv per slp recs Foley - Per TRH/Urology    A. Fib - cards has seen Urinary retention/hydronephrosis  with foley - urology following HTN HLD ESRD on HD (M/W/F) - nephro following DM2 Aortic Stenosis s/p TAVR on Plavix  L BKA CHF (EF 65-70% on ECHO 01/05/21) Parkinson's Remote hx of prostate CA who presented for abdominal pain.   Moderate Medical Decision Making   LOS: 6 days    Jillyn Ledger , [facility] 03/21/2021, 8:57 AM Please see Amion for pager number during day hours 7:00am-4:30pm

## 2021-03-21 NOTE — Progress Notes (Addendum)
Progress Note  Patient Name: Elijah White Date of Encounter: 03/21/2021  [facility] Cardiologist: Evalina Field, MD   Subjective   Patient denies chest pain, palpitations, dizziness/lightheadedness, trouble breathing.   Inpatient Medications    Scheduled Meds:  (feeding supplement) PROSource Plus  30 mL Oral BID BM   amiodarone  400 mg Oral BID   apixaban  2.5 mg Oral BID   brimonidine  1 drop Right Eye TID   carbidopa-levodopa  1 tablet Oral TID   Chlorhexidine Gluconate Cloth  6 each Topical Q0600   cinacalcet  60 mg Oral Q M,W,F-HD   darbepoetin (ARANESP) injection - DIALYSIS  40 mcg Intravenous Q Mon-HD   doxercalciferol  2 mcg Intravenous Q M,W,F-HD   insulin aspart  0-6 Units Subcutaneous Q4H   latanoprost  1 drop Right Eye QHS   pantoprazole (PROTONIX) IV  40 mg Intravenous Q24H   pilocarpine  1 drop Right Eye QID   sevelamer carbonate  1,600 mg Oral TID WC   sodium chloride flush  10-40 mL Intracatheter Q12H   Continuous Infusions:  sodium chloride 50 mL/hr at 03/21/21 0225   piperacillin-tazobactam (ZOSYN)  IV 2.25 g (03/21/21 0734)   PRN Meds: acetaminophen, HYDROmorphone (DILAUDID) injection, ipratropium-albuterol, ondansetron **OR** ondansetron (ZOFRAN) IV, sodium chloride flush   Vital Signs    Vitals:   03/20/21 1805 03/20/21 2011 03/21/21 0349 03/21/21 0748  BP: (!) 115/28 (!) 99/29 (!) 93/22 (!) 91/58  Pulse: 79 80 70 88  Resp: 17 17 14 16   Temp: 98.8 F (37.1 C) 98 F (36.7 C) 97.8 F (36.6 C) 98.9 F (37.2 C)  TempSrc: Oral Oral Oral Oral  SpO2: 100% 100% 98% 99%  Weight:      Height:        Intake/Output Summary (Last 24 hours) at 03/21/2021 0835 Last data filed at 03/20/2021 1735 Gross per 24 hour  Intake 240 ml  Output 0 ml  Net 240 ml   Last 3 Weights 03/20/2021 03/18/2021 03/18/2021  Weight (lbs) 136 lb 14.5 oz 136 lb 3.9 oz 136 lb 3.9 oz  Weight (kg) 62.1 kg 61.8 kg 61.8 kg      Telemetry    Sinus rhythm, HR in the  70s, rare PVCs - Personally Reviewed  ECG     No new tracings since 1/13 - Personally Reviewed  Physical Exam   GEN: No acute distress.   Neck: No JVD Cardiac: RRR, no murmurs, rubs, or gallops.  Respiratory: Clear to auscultation bilaterally. GI: Soft, nontender, non-distended right percutaneous drain in place MS: No edema; No deformity. Neuro:  Nonfocal  Psych: Normal affect   Labs    High Sensitivity Troponin:  No results for input(s): TROPONINIHS in the last 720 hours.   Chemistry Recent Labs  Lab 03/16/21 0545 03/16/21 2200 03/17/21 0328 03/17/21 1701 03/18/21 0325 03/20/21 0600  NA 139   < > 137 137 138 135  K 5.4*   < > 6.0* 5.6* 5.5* 3.2*  CL 85*   < > 91* 93* 95* 96*  CO2 29   < > 27 23 21* 24  GLUCOSE 249*   < > 140* 130* 118* 163*  BUN 41*   < > 64* 73* 76* 45*  CREATININE 8.56*   < > 9.84* 10.14* 10.40* 6.44*  CALCIUM 9.2   < > 8.6* 8.5* 9.0 8.6*  PROT 8.5*  --  7.1  --  6.3*  --   ALBUMIN 2.6*   < >  2.4* 2.0* 2.0* 1.7*  AST 48*  --  57*  --  30  --   ALT 14  --  15  --  7  --   ALKPHOS 98  --  106  --  79  --   BILITOT 1.0  --  1.4*  --  1.0  --   GFRNONAA 6*   < > 5* 5* 5* 8*  ANIONGAP 25*   < > 19* 21* 22* 15   < > = values in this interval not displayed.    Lipids No results for input(s): CHOL, TRIG, HDL, LABVLDL, LDLCALC, CHOLHDL in the last 168 hours.  Hematology Recent Labs  Lab 03/18/21 0325 03/19/21 0255 03/20/21 0600  WBC 25.8* 18.2* 12.1*  RBC 3.32* 3.17* 2.47*  HGB 11.3* 10.9* 8.5*  HCT 34.9* 32.9* 25.4*  MCV 105.1* 103.8* 102.8*  MCH 34.0 34.4* 34.4*  MCHC 32.4 33.1 33.5  RDW 14.2 14.3 14.5  PLT 421* 321 277   Thyroid No results for input(s): TSH, FREET4 in the last 168 hours.  BNPNo results for input(s): BNP, PROBNP in the last 168 hours.  DDimer No results for input(s): DDIMER in the last 168 hours.   Radiology    [place] EKG SITE RITE  Result Date: 03/19/2021 If Site Rite image not attached, placement could not be  confirmed due to current cardiac rhythm.   Cardiac Studies   TTE 03/17/2021 1. Left ventricular ejection fraction, by estimation, is 60 to 65%. The  left ventricle has normal function. There is moderate concentric left  ventricular hypertrophy.   2. Right ventricular systolic function is normal. The right ventricular  size is normal.   3. The mitral valve is degenerative. Severe mitral annular calcification.   4. The aortic valve has been repaired/replaced. There is a 26 mm Sapien  prosthetic (TAVR) valve present in the aortic position. Echo findings are  consistent with normal structure and function of the aortic valve  prosthesis. Aortic valve mean gradient  measures 5.0 mmHg. Aortic valve Vmax measures 1.40 m/s. DI 0.8. There is  no paravalvular leak.   5. The inferior vena cava is normal in size with <50% respiratory  variability, suggesting right atrial pressure of 8 mmHg.   Comparison(s): Compared to prior TTE on 01/2021, there is no significant  change. TAVR valve continues to be well seated with current gradient 67mmHg  (previously 41mmHg).     Patient Profile     81 y.o. male with medical history significant of ESRD on HD MWF, aortic stenosis status post TAVR 2021, nonobstructive CAD, Parkinson's disease, PAD on Plavix, s/p left BKA, HTN, IIDM, HLD,  came with recurrent right upper quadrant abdominal pain.  Thought to have cholecystitis and drain placed by IR.  Stay in hospital complicated by a fib with RVR,  sepsis  2/2 acute cholecystitis ; bld cultures pending s/p perc drain on IV zosyn now in sinus rhythm on amio gtt  Assessment & Plan    New onset atrial fibrillation: in the setting of acute chole. Currently in sinus rhythm - Chads2vasc 4.  - Rate control was attempted but the patient had significant RVR with hypotension  - Converted to sinus rhythm on 1/14 on amiodarone, remains in sinus rhythm per telemetry  - Continue oral amiodarone 400 mg BID (started 1/14) until  1/21, then plan to decrease to 200 mg. Will arrange outpatient follow up to ensure proper dose adjustments  - Anticoagulated with eliquis 2.5mg  BID (patient is  ESRD). Transitioned from heparin to oral anticoagulation on 1/15  - Hemoglobin decreased to 8.5 on 1/16 (down from 10.9). Improved to 9.4 this AM. Continue to monitor. OK to continue anticoagulation for now.   Acute cholecystitis, sepsis  - s/p percutaneous chole drain - Managed per general surgery, primary team  PAD: s/p BKA  - Continue home statin  - Plavix held. OK to continue to hold until hemoglobin improves  S/p 26 mm Sapien prosthetic (TAVR)  - Normal mean gradient, well seated on echo 03/17/21   HTN  - BP medications held in the setting of low BP   ESRD  - Managed per nephrology       For questions or updates, please contact [place] Please consult [email or web address] for contact info under        Signed, Margie Billet, PA-C   Pt seen and examined   I agree with findings as noted aby Sandria Senter above     Patient resting comfortable  Neck:   JVP is normal  Lungs are CTA Cardiac exam:   RRR  N oS3   Ext are without edema   Keep on current regimen as noted above  Will continue to follow   [name] MD

## 2021-03-21 NOTE — Progress Notes (Signed)
Progress Note    Elijah White  ZOX:[id number] DOB: 02-Mar-1941  DOA: 03/15/2021 PCP: Maryella Shivers, MD    Brief Narrative:    Medical records reviewed and are as summarized below:  Elijah White is an 81 y.o. male with medical history significant of ESRD on HD MWF, aortic stenosis status post TAVR 2021, nonobstructive CAD, Parkinson's disease, PVD on Plavix, s/p left BKA, HTN, IIDM, HLD,  came with recurrent right upper quadrant abdominal pain.  Thought to have cholecystitis and drain placed by IR.  Stay in hospital complicated by a fib with RVR, continued WBC count elevation and abdominal pain.  Found to have acute urinary retention/pyocystis needing placement of a foley.  Began to improve after foley placed    Assessment/Plan:   Principal Problem:   Acute cholecystitis Active Problems:   Complicated UTI (urinary tract infection)   Severe sepsis from pyocystis/acute cholecystitis  -finally starting to improve -cultures unrevealing-- change to PO -ID consult appreciated   Acute urinary retention/UTI/hydronephrosis -IV abx--- change to PO and monitor symptoms -foley catheter- if pyocystis persists may need to change out catheter for 3 way to start irrigation -urology consult appreciated  Acute RUQ abdominal pain with leucocytosis -Secondary to cholelithiasis, suspect early acute cholecystitis with clinical Murphy sign positive. -IV abx (zosyn)- change to Po abx -GS consult IR consult for percutaneous drain CT scan suggestive of perhaps a Right sided PNA-- on abx already-- continue pulmonary toilet  ESRD on HD -nephrology consulted.  -HD 1/16  A fib with RVR -dig x 1 -amiodarone started -cards consult much appreciated -old EKG reviewed again by cards and DOES NOT APPEAR to be mobitz type 2- more likely sinus with non-conducted PACs -NOAC  HTN -holding home meds due to hypotension   Parkinson's disease -continue Sinemet when able to take PO   Diabetic  neuropathy -Continue gabapentin when able to take PO  Hyperglycemia -SSI for now   Family Communication/Anticipated D/C date and plan/Code Status   DVT prophylaxis: eliquis Code Status: DNR Disposition Plan: Status is: Inpatient Spoke with daughter Remains inpatient appropriate because: back to SNF in 24-48 hours if change to PO abx ok    Medical Consultants:   [facility] urology   Subjective:   Asking about going back to his SNF  Objective:    Vitals:   03/20/21 2011 03/21/21 0349 03/21/21 0748 03/21/21 1139  BP: (!) 99/29 (!) 93/22 (!) 91/58 (!) 95/48  Pulse: 80 70 88 79  Resp: 17 14 16 17   Temp: 98 F (36.7 C) 97.8 F (36.6 C) 98.9 F (37.2 C) 98.5 F (36.9 C)  TempSrc: Oral Oral Oral Oral  SpO2: 100% 98% 99% 100%  Weight:      Height:        Intake/Output Summary (Last 24 hours) at 03/21/2021 1501 Last data filed at 03/21/2021 1335 Gross per 24 hour  Intake 460 ml  Output 0 ml  Net 460 ml   Filed Weights   03/18/21 1240 03/18/21 1554 03/20/21 1421  Weight: 61.8 kg 61.8 kg 62.1 kg    Exam:    General: Appearance:    elderly male in no acute distress     Lungs:     Not on O2, respirations unlabored  Heart:    Normal heart rate.   MS:   Below knee amputation of left lower extremity is noted.   Neurologic:   Awake, alert, pleasant and cooperative  Data Reviewed:   I have personally reviewed following labs and imaging studies:  Labs: Labs show the following:   Basic Metabolic Panel: Recent Labs  Lab 03/16/21 2200 03/17/21 0328 03/17/21 1701 03/18/21 0325 03/20/21 0600 03/21/21 0840  NA 139 137 137 138 135 136  K 6.1* 6.0* 5.6* 5.5* 3.2* 3.3*  CL 88* 91* 93* 95* 96* 98  CO2 28 27 23  21* 24 25  GLUCOSE 170* 140* 130* 118* 163* 133*  BUN 59* 64* 73* 76* 45* 36*  CREATININE 9.69* 9.84* 10.14* 10.40* 6.44* 4.34*  CALCIUM 9.2 8.6* 8.5* 9.0 8.6* 8.3*  PHOS 6.6*  --  6.7*  --  4.0 2.5   GFR Estimated Creatinine  Clearance: 11.4 mL/min (A) (by C-G formula based on SCr of 4.34 mg/dL (H)). Liver Function Tests: Recent Labs  Lab 03/15/21 0855 03/16/21 0545 03/16/21 2200 03/17/21 0328 03/17/21 1701 03/18/21 0325 03/20/21 0600 03/21/21 0840  AST 26 48*  --  57*  --  30  --   --   ALT 10 14  --  15  --  7  --   --   ALKPHOS 92 98  --  106  --  79  --   --   BILITOT 1.0 1.0  --  1.4*  --  1.0  --   --   PROT 7.4 8.5*  --  7.1  --  6.3*  --   --   ALBUMIN 3.2* 2.6*   < > 2.4* 2.0* 2.0* 1.7* 1.6*   < > = values in this interval not displayed.   Recent Labs  Lab 03/15/21 0855  LIPASE 20   No results for input(s): AMMONIA in the last 168 hours. Coagulation profile No results for input(s): INR, PROTIME in the last 168 hours.  CBC: Recent Labs  Lab 03/15/21 0855 03/16/21 0251 03/17/21 0328 03/17/21 1701 03/18/21 0325 03/19/21 0255 03/20/21 0600 03/21/21 0840  WBC 19.1*   < > 44.0*   42.7* 31.3* 25.8* 18.2* 12.1* 13.2*  NEUTROABS 16.9*  --  40.8*  --   --   --   --   --   HGB 13.8   < > 13.6   13.2 11.9* 11.3* 10.9* 8.5* 9.4*  HCT 42.1   < > 39.7   40.3 36.6* 34.9* 32.9* 25.4* 27.7*  MCV 106.3*   < > 105.0*   105.5* 104.9* 105.1* 103.8* 102.8* 103.4*  PLT 414*   < > 455*   442* 412* 421* 321 277 288   < > = values in this interval not displayed.   Cardiac Enzymes: No results for input(s): CKTOTAL, CKMB, CKMBINDEX, TROPONINI in the last 168 hours. BNP (last 3 results) No results for input(s): PROBNP in the last 8760 hours. CBG: Recent Labs  Lab 03/20/21 1804 03/20/21 2046 03/21/21 0503 03/21/21 0734 03/21/21 1142  GLUCAP 128* 155* 119* 96 155*   D-Dimer: No results for input(s): DDIMER in the last 72 hours. Hgb A1c: No results for input(s): HGBA1C in the last 72 hours. Lipid Profile: No results for input(s): CHOL, HDL, LDLCALC, TRIG, CHOLHDL, LDLDIRECT in the last 72 hours. Thyroid function studies: No results for input(s): TSH, T4TOTAL, T3FREE, THYROIDAB in the last 72  hours.  Invalid input(s): FREET3 Anemia work up: No results for input(s): VITAMINB12, FOLATE, FERRITIN, TIBC, IRON, RETICCTPCT in the last 72 hours. Sepsis Labs: Recent Labs  Lab 03/16/21 2323 03/17/21 0328 03/17/21 1701 03/18/21 0325 03/19/21 0255 03/20/21 0600 03/21/21 0840  WBC  --  44.0*   42.7* 31.3* 25.8* 18.2* 12.1* 13.2*  LATICACIDVEN 2.1* 1.8 1.2  --   --   --   --     Microbiology Recent Results (from the past 240 hour(s))  Resp Panel by RT-PCR (Flu A&B, Covid) Nasopharyngeal Swab     Status: None   Collection Time: 03/15/21  1:35 PM   Specimen: Nasopharyngeal Swab; Nasopharyngeal(NP) swabs in vial transport medium  Result Value Ref Range Status   SARS Coronavirus 2 by RT PCR NEGATIVE NEGATIVE Final    Comment: (NOTE) SARS-CoV-2 target nucleic acids are NOT DETECTED.  The SARS-CoV-2 RNA is generally detectable in upper respiratory specimens during the acute phase of infection. The lowest concentration of SARS-CoV-2 viral copies this assay can detect is 138 copies/mL. A negative result does not preclude SARS-Cov-2 infection and should not be used as the sole basis for treatment or other patient management decisions. A negative result may occur with  improper specimen collection/handling, submission of specimen other than nasopharyngeal swab, presence of viral mutation(s) within the areas targeted by this assay, and inadequate number of viral copies(<138 copies/mL). A negative result must be combined with clinical observations, patient history, and epidemiological information. The expected result is Negative.  Fact Sheet for Patients:  EntrepreneurPulse.com.au  Fact Sheet for Healthcare Providers:  IncredibleEmployment.be  This test is no t yet approved or cleared by the [place] FDA and  has been authorized for detection and/or diagnosis of SARS-CoV-2 by FDA under an Emergency Use Authorization (EUA). This EUA will  remain  in effect (meaning this test can be used) for the duration of the COVID-19 declaration under Section 564(b)(1) of the Act, 21 U.S.C.section 360bbb-3(b)(1), unless the authorization is terminated  or revoked sooner.       Influenza A by PCR NEGATIVE NEGATIVE Final   Influenza B by PCR NEGATIVE NEGATIVE Final    Comment: (NOTE) The Xpert Xpress SARS-CoV-2/FLU/RSV plus assay is intended as an aid in the diagnosis of influenza from Nasopharyngeal swab specimens and should not be used as a sole basis for treatment. Nasal washings and aspirates are unacceptable for Xpert Xpress SARS-CoV-2/FLU/RSV testing.  Fact Sheet for Patients: EntrepreneurPulse.com.au  Fact Sheet for Healthcare Providers: IncredibleEmployment.be  This test is not yet approved or cleared by the [place] FDA and has been authorized for detection and/or diagnosis of SARS-CoV-2 by FDA under an Emergency Use Authorization (EUA). This EUA will remain in effect (meaning this test can be used) for the duration of the COVID-19 declaration under Section 564(b)(1) of the Act, 21 U.S.C. section 360bbb-3(b)(1), unless the authorization is terminated or revoked.  Performed at [facility], [place] [address], [place], [place] [address]   Body fluid culture w Gram Stain     Status: None   Collection Time: 03/16/21  8:16 AM   Specimen: BILE; Body Fluid  Result Value Ref Range Status   Specimen Description BILE  Final   Special Requests BILE  Final   Gram Stain NO WBC SEEN NO ORGANISMS SEEN   Final   Culture   Final    NO GROWTH 3 DAYS Performed at [facility], [place] [address], [place], [place] [address]    Report Status 03/19/2021 FINAL  Final  Culture, blood (routine x 2)     Status: None (Preliminary result)   Collection Time: 03/17/21  3:07 AM   Specimen: BLOOD RIGHT HAND  Result Value Ref Range Status   Specimen Description BLOOD RIGHT HAND  Final    Special Requests AEROBIC BOTTLE ONLY Blood Culture adequate volume  Final   Culture   Final    NO GROWTH 4 DAYS Performed at [facility], [place] [address], [place], [place] [address]    Report Status PENDING  Incomplete  Culture, blood (routine x 2)     Status: None (Preliminary result)   Collection Time: 03/17/21  3:17 AM   Specimen: BLOOD RIGHT HAND  Result Value Ref Range Status   Specimen Description BLOOD RIGHT HAND  Final   Special Requests   Final    AEROBIC BOTTLE ONLY Blood Culture results may not be optimal due to an inadequate volume of blood received in culture bottles   Culture   Final    NO GROWTH 4 DAYS Performed at [facility], [place] [address], [place], [place] [address]    Report Status PENDING  Incomplete  Urine Culture     Status: None   Collection Time: 03/18/21 10:30 AM   Specimen: In/Out Cath Urine  Result Value Ref Range Status   Specimen Description IN/OUT CATH URINE  Final   Special Requests NONE  Final   Culture   Final    NO GROWTH Performed at [facility], [place] [address], [place], [place] [address]    Report Status 03/19/2021 FINAL  Final    Procedures and diagnostic studies:  No results found.  Medications:    (feeding supplement) PROSource Plus  30 mL Oral BID BM   amiodarone  400 mg Oral BID   apixaban  2.5 mg Oral BID   brimonidine  1 drop Right Eye TID   carbidopa-levodopa  1 tablet Oral TID   [START ON 03/22/2021] cefdinir  300 mg Oral Q M,W,F-1800   cefdinir  300 mg Oral Once   Chlorhexidine Gluconate Cloth  6 each Topical Q0600   cinacalcet  60 mg Oral Q M,W,F-HD   darbepoetin (ARANESP) injection - DIALYSIS  40 mcg Intravenous Q Mon-HD   doxercalciferol  2 mcg Intravenous Q M,W,F-HD   insulin aspart  0-6 Units Subcutaneous Q4H   latanoprost  1 drop Right Eye QHS   pantoprazole (PROTONIX) IV  40 mg Intravenous Q24H   pilocarpine  1 drop Right Eye QID   sevelamer carbonate  1,600 mg Oral TID WC   sodium chloride  flush  10-40 mL Intracatheter Q12H   Continuous Infusions:     LOS: 6 days   Geradine Girt  [facility]   How to contact the [facility] Attending or Consulting provider [place] or covering provider during after hours [place], for this patient?  Check the care team in [facility] and look for a) attending/consulting [facility] provider listed and b) the [facility] team listed Log into [email or web address] and use [place]'s universal password to access. If you do not have the password, please contact the hospital operator. Locate the [facility] provider you are looking for under [facility] and page to a number that you can be directly reached. If you still have difficulty reaching the provider, please page the [facility] (Director on Call) for the Hospitalists listed on amion for assistance.  03/21/2021, 3:01 PM

## 2021-03-21 NOTE — Care Management Important Message (Signed)
Important Message  Patient Details  Name: Elijah White MRN: [id number] Date of Birth: [birth year]   Medicare Important Message Given:  Yes     [name] 03/21/2021, 8:26 AM

## 2021-03-21 NOTE — NC FL2 (Signed)
[place] LEVEL OF CARE SCREENING TOOL     IDENTIFICATION  Patient Name: Elijah White Birthdate: 03/12/1940 Sex: male Admission Date (Current Location): 03/15/2021  [place] and [place] Number:  Elijah White [id number] [place] and Address:  [facility] [place], [place] [address] 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, 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[place]) 08/12/2020   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   Acute on chronic diastolic heart failure ([place]) 01/19/2020   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   RBBB    Hypercalcemia 12/16/2019   Near syncope 12/13/2019   Moderate protein-calorie malnutrition ([place]) 12/04/2019   Severe aortic stenosis    ESRD (end stage renal disease) ([place]) 11/26/2019   Macrocytic anemia 11/26/2019   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/26/2019   Colonic mass 11/26/2019   Allergy, unspecified, initial encounter 11/16/2019   Anaphylactic shock, unspecified, initial encounter 11/16/2019   Other long term (current) drug therapy 09/05/2019   Iron deficiency anemia, unspecified 07/13/2019   Encounter for immunization 04/17/2019   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   Dependence on renal dialysis ([place]) 04/16/2019   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 kidney disease, or unspecified chronic kidney disease 04/16/2019   Localized edema 04/16/2019   Malignant  neoplasm of prostate ([place]) 04/16/2019   Nicotine dependence, unspecified, uncomplicated 28/00/3491   Other disorders of phosphorus metabolism 04/16/2019   Pain, unspecified 04/16/2019   Pruritus, unspecified 04/16/2019   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   Shortness of breath 04/16/2019   Unspecified glaucoma 04/16/2019   Unspecified urinary incontinence 04/16/2019   ARF (acute renal failure) ([place]) 03/25/2018   Vision loss of right eye 11/11/2017   Bilateral pseudophakia 07/11/2017   GERD (gastroesophageal reflux disease) 09/10/2016   HLD (hyperlipidemia) 09/10/2016   Essential hypertension 08/13/2016   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   Type 2 diabetes mellitus with renal manifestations ([place]) 10/22/2006   BPH (benign prostatic hyperplasia) 10/22/2006   COLONIC POLYPS, HX OF 10/22/2006    Orientation RESPIRATION BLADDER Height & Weight     Self, Situation, Place  Normal Incontinent, Indwelling catheter Weight: 136 lb 14.5 oz (62.1 kg) Height:  5\' 5"  (165.1 cm)  BEHAVIORAL SYMPTOMS/MOOD NEUROLOGICAL BOWEL NUTRITION STATUS      Incontinent Diet (Please see discharge summary)  AMBULATORY STATUS COMMUNICATION OF NEEDS Skin   Total Care Verbally Other (Comment) (Appropriate for ethnicity,dry, amputation,leg,left)                       Personal Care Assistance Level of Assistance  Bathing, Feeding, Dressing Bathing Assistance: Maximum assistance Feeding assistance: Limited assistance Dressing Assistance: Maximum assistance     Functional Limitations Info  Sight, Hearing, Speech Sight Info: Impaired Hearing Info: Adequate Speech Info: Impaired    SPECIAL CARE FACTORS FREQUENCY  PT (By licensed PT), OT (By licensed OT)     PT Frequency: 5x min weekly OT Frequency:  5x min weekly            Contractures Contractures Info: Not present    Additional Factors Info  Code Status Code Status Info: DNR Allergies Info: NKA   Insulin  Sliding Scale Info: Novolog, 0-6 units q4.  See discharge summary.       Current Medications (03/21/2021):  This is the current hospital active medication list Current Facility-Administered Medications  Medication Dose Route Frequency Provider Last Rate Last Admin   (feeding supplement) PROSource Plus liquid 30 mL  30 mL Oral BID BM Elijah Gu, NP   30 mL at 03/20/21 1006   0.9 %  sodium chloride infusion   Intravenous Continuous Elijah Bear U, DO 50 mL/hr at 03/21/21 0225 New Bag at 03/21/21 0225   acetaminophen (TYLENOL) tablet 650 mg  650 mg Oral Q6H PRN Elijah Girt, DO   650 mg at 03/20/21 1720   amiodarone (PACERONE) tablet 400 mg  400 mg Oral BID Elijah Haines, MD   400 mg at 03/20/21 2236   apixaban (ELIQUIS) tablet 2.5 mg  2.5 mg Oral BID Elijah White, Elijah White   2.5 mg at 03/20/21 2236   brimonidine (ALPHAGAN) 0.2 % ophthalmic solution 1 drop  1 drop Right Eye TID Elijah Fines T, MD   1 drop at 03/20/21 2237   carbidopa-levodopa (SINEMET IR) 25-100 MG per tablet immediate release 1 tablet  1 tablet Oral TID Elijah Halt, MD   1 tablet at 03/21/21 0734   Chlorhexidine Gluconate Cloth 2 % PADS 6 each  6 each Topical Q0600 Elijah Gu, NP   6 each at 03/21/21 0654   cinacalcet (SENSIPAR) tablet 60 mg  60 mg Oral Q M,W,F-HD Elijah Gu, NP       Darbepoetin Alfa (ARANESP) injection 40 mcg  40 mcg Intravenous Q Mon-HD Elijah Child, Elijah White   40 mcg at 03/20/21 1627   doxercalciferol (HECTOROL) injection 2 mcg  2 mcg Intravenous Q M,W,F-HD Elijah Gu, NP   2 mcg at 03/18/21 1505   HYDROmorphone (DILAUDID) injection 0.5 mg  0.5 mg Intravenous Q2H PRN Elijah Bear U, DO   0.5 mg at 03/18/21 1715   insulin aspart (novoLOG) injection 0-6 Units  0-6 Units Subcutaneous Q4H Elijah White, Elijah U, DO   1 Units at 03/20/21 2235   ipratropium-albuterol (DUONEB) 0.5-2.5 (3) MG/3ML nebulizer solution 3 mL  3 mL Nebulization Q4H PRN Elijah Fines T, MD        latanoprost (XALATAN) 0.005 % ophthalmic solution 1 drop  1 drop Right Eye QHS Elijah White, Elijah U, DO   1 drop at 03/20/21 2237   ondansetron (ZOFRAN) tablet 4 mg  4 mg Oral Q6H PRN Elijah Fines T, MD       Or   ondansetron [facility]) injection 4 mg  4 mg Intravenous Q6H PRN Elijah Fines T, MD   4 mg at 03/18/21 0804   pantoprazole (PROTONIX) injection 40 mg  40 mg Intravenous Q24H Elijah White, Elijah U, DO   40 mg at 03/20/21 1006   pilocarpine (PILOCAR) 4 % ophthalmic solution 1 drop  1 drop Right Eye QID Elijah Fines T, MD   1 drop at 03/20/21 2237   piperacillin-tazobactam (ZOSYN) IVPB 2.25 g  2.25 g Intravenous Q8H Elijah Fines T, MD 100 mL/hr at 03/21/21 0734 2.25 g at 03/21/21 0734   sevelamer carbonate (RENVELA) tablet 1,600 mg  1,600 mg Oral TID WC Elijah Gu, NP  1,600 mg at 03/21/21 0734   sodium chloride flush (NS) 0.9 % injection 10-40 mL  10-40 mL Intracatheter Q12H Elijah White, Elijah U, DO   10 mL at 03/20/21 2251   sodium chloride flush (NS) 0.9 % injection 10-40 mL  10-40 mL Intracatheter PRN Elijah Girt, DO         Discharge Medications: Please see discharge summary for a list of discharge medications.  Relevant Imaging Results:  Relevant Lab Results:   Additional Information [phone]  Joanne Chars, LCSW

## 2021-03-21 NOTE — Progress Notes (Addendum)
Referring Physician(s): Dr. Jacqulynn Cadet  Supervising Physician: Markus Daft  Patient Status:  Elijah White - In-pt  Chief Complaint:  S/p placement of 62F percutaneous transhepatic cholecystostomy (gallbladder) tube 03/16/21.   Subjective:  Pt c/o mild pain when flushing. He has no other c/o .   Allergies: Patient has no known allergies.  Medications: Prior to Admission medications   Medication Sig Start Date End Date Taking? Authorizing Provider  acetaminophen (TYLENOL) 500 MG tablet Take 1,000 mg by mouth every 6 (six) hours as needed for moderate pain or headache.   Yes [provider]  aspirin EC 81 MG tablet Take 81 mg by mouth in the morning. Swallow whole.   Yes [provider]  atorvastatin (LIPITOR) 10 MG tablet Take 1 tablet (10 mg total) by mouth in the morning. 08/16/20  Yes Rhyne, Samantha J, PA-C  AURYXIA 1 GM 210 MG(Fe) tablet Take 420 mg by mouth 3 (three) times daily with meals. 05/30/20  Yes [provider]  B Complex-C-Folic Acid (DIALYVITE 419) 0.8 MG TABS Take 1 tablet by mouth in the morning. 05/08/19  Yes [provider]  brimonidine (ALPHAGAN) 0.15 % ophthalmic solution Place 1 drop into the right eye 3 (three) times daily.   Yes [provider]  carbidopa-levodopa (SINEMET) 25-100 MG tablet Take 1 tablet by mouth 3 (three) times daily. Patient taking differently: Take 1 tablet by mouth 3 (three) times daily. 1 TABLET @ 8am/ 12pm/ 4pm 10/19/20 03/15/21 Yes Curatolo, Adam, DO  clopidogrel (PLAVIX) 75 MG tablet Take 1 tablet (75 mg total) by mouth daily with breakfast. 07/25/20  Yes Marty Heck, MD  docusate sodium (COLACE) 100 MG capsule Take 100 mg by mouth 2 (two) times daily.   Yes [provider]  dorzolamide-timolol (COSOPT) 22.3-6.8 MG/ML ophthalmic solution Place 1 drop into the right eye 2 (two) times daily. 06/25/17  Yes [provider]  gabapentin (NEURONTIN) 100 MG capsule Take 1 capsule  (100 mg total) by mouth daily. 10/19/20 03/15/21 Yes Curatolo, Adam, DO  isosorbide mononitrate (IMDUR) 30 MG 24 hr tablet Take 1 tablet (30 mg total) by mouth in the morning. 08/16/20  Yes Rhyne, Hulen Shouts, PA-C  Melatonin 5 MG CAPS Take 5 mg by mouth at bedtime.   Yes [provider]  pantoprazole (PROTONIX) 40 MG tablet Take 1 tablet (40 mg total) by mouth daily at 12 noon. 08/16/20  Yes Rhyne, Hulen Shouts, PA-C  pilocarpine (PILOCAR) 4 % ophthalmic solution Place 1 drop into the right eye 4 (four) times daily.    Yes [provider]  polyethylene glycol (MIRALAX / GLYCOLAX) 17 g packet Take 17 g by mouth at bedtime.   Yes [provider]  ROCKLATAN 0.02-0.005 % SOLN Place 1 drop into the right eye at bedtime. 06/20/20  Yes [provider]  blood glucose meter kit and supplies KIT Dispense based on patient and insurance preference. Use up to four times daily as directed. (FOR ICD-9 250.00, 250.01). For QAC - HS accuchecks. 03/30/18   Thurnell Lose, MD  carvedilol (COREG) 3.125 MG tablet Take 1 tablet (3.125 mg total) by mouth 2 (two) times daily with a meal. Patient not taking: Reported on 03/15/2021 08/16/20   Gabriel Earing, PA-C  Doxercalciferol (HECTOROL IV) Doxercalciferol (Hectorol) 08/09/20 08/28/21  [provider]  Lancets (ONETOUCH DELICA PLUS RCOIOD44P) Carpenter  03/31/18   [provider]  Methoxy PEG-Epoetin Beta (MIRCERA IJ) Mircera 09/05/20 09/04/21  [provider]  ONETOUCH VERIO test strip USE AS DIRECTED TO TEST FOUR TIMES A DAY 07/31/18   [name], MD  oxyCODONE-acetaminophen (PERCOCET) 5-325 MG tablet Take 1 tablet by mouth every 6 (six) hours as needed (pain). Patient not taking: Reported on 03/15/2021 08/19/20   Karoline Caldwell, PA-C     Vital Signs: BP (!) 91/58 (BP Location: Right Leg)    Pulse 88    Temp 98.9 F (37.2 C) (Oral)    Resp 16    Ht $R'5\' 5"'Cj$  (1.651 m)    Wt 136 lb 14.5 oz (62.1 kg)    SpO2 99%    BMI 22.78  kg/m   Physical Exam Constitutional:      Appearance: He is not ill-appearing.  HENT:     Head: Normocephalic and atraumatic.  Cardiovascular:     Rate and Rhythm: Normal rate.  Pulmonary:     Effort: Pulmonary effort is normal. No respiratory distress.  Abdominal:     Comments: Drain unremarkable with sutures/statlock in place. Dressing C/D/I. No redness, bleeding or drainage from site. No OP in gravity bag. Drain flushes with difficulty and does not aspirate. Pt denies pain with flushing. No OP documented in Epic over past 24 hours.   Skin:    General: Skin is warm and dry.  Neurological:     Mental Status: He is alert.  Psychiatric:        Mood and Affect: Mood normal.        Behavior: Behavior normal.    Imaging: ECHOCARDIOGRAM LIMITED  Result Date: 03/17/2021    ECHOCARDIOGRAM LIMITED REPORT   Patient Name:   Elijah White Date of Exam: 03/17/2021 Medical Rec #:  [id number]       Height:       65.0 in Accession #:    [id number]      Weight:       143.3 lb Date of Birth:  August 21, 1940       BSA:          1.717 m Patient Age:    81 years        BP:           116/50 mmHg Patient Gender: M               HR:           114 bpm. Exam Location:  Inpatient Procedure: Cardiac Doppler, Color Doppler and Limited Echo                         STAT ECHO Reported to: Dr Gwyndolyn Kaufman on 03/17/2021 5:50:00 PM. Indications:    Hypotension  History:        Patient has no prior history of Echocardiogram examinations,                 most recent 01/05/2021.                 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  Sonographer:    Clayton Lefort RDCS (AE) Referring Phys: [id number] Darreld Mclean  Sonographer Comments: Image acquisition challenging due to respiratory motion. IMPRESSIONS  1. Left ventricular ejection fraction, by estimation, is 60 to 65%. The left ventricle has normal function. There is moderate concentric left ventricular  hypertrophy.  2. Right ventricular systolic function is  normal. The right ventricular size is normal.  3. The mitral valve is degenerative. Severe mitral annular calcification.  4. 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 Echo findings are consistent with normal structure and function of the aortic valve prosthesis. Aortic valve mean gradient measures 5.0 mmHg. Aortic valve Vmax measures 1.40 m/s. DI 0.8. There is no paravalvular leak.  5. The inferior vena cava is normal in size with <50% respiratory variability, suggesting right atrial pressure of 8 mmHg. Comparison(s): Compared to prior TTE on 01/2021, there is no significant change. TAVR valve continues to be well seated with current gradient 69mmHg (previously 37mmHg). FINDINGS  Left Ventricle: Left ventricular ejection fraction, by estimation, is 60 to 65%. The left ventricle has normal function. The left ventricular internal cavity size was normal in size. There is moderate concentric left ventricular hypertrophy. Right Ventricle: The right ventricular size is normal. Right ventricular systolic function is normal. Pericardium: There is no evidence of pericardial effusion. Mitral Valve: The mitral valve is degenerative in appearance. There is moderate thickening of the mitral valve leaflet(s). There is moderate calcification of the mitral valve leaflet(s). Severe mitral annular calcification. Tricuspid Valve: The tricuspid valve is normal in structure. Tricuspid valve regurgitation is trivial. Aortic Valve: DI 0.8. No PVL. The aortic valve has been repaired/replaced. Aortic valve mean gradient measures 5.0 mmHg. Aortic valve peak gradient measures 7.8 mmHg. Aortic valve area, by VTI measures 2.67 cm. 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 Echo findings are consistent with normal structure and function of the aortic valve  prosthesis. Pulmonic Valve: The pulmonic valve was not well visualized. Pulmonic valve regurgitation is trivial. Aorta: The aortic root and ascending aorta are structurally normal, with no evidence of dilitation. Venous: The inferior vena cava is normal in size with less than 50% respiratory variability, suggesting right atrial pressure of 8 mmHg. LEFT VENTRICLE PLAX 2D LVIDd:         3.90 cm LVIDs:         3.00 cm LV PW:         1.30 cm LV IVS:        1.20 cm LVOT diam:     2.00 cm LV SV:         61 LV SV Index:   36 LVOT Area:     3.14 cm  LEFT ATRIUM           Index LA diam:      3.40 cm 1.98 cm/m LA Vol (A4C): 20.0 ml 11.65 ml/m  AORTIC VALVE AV Area (Vmax):    2.51 cm AV Area (Vmean):   2.53 cm AV Area (VTI):     2.67 cm AV Vmax:           140.00 cm/s AV Vmean:          104.000 cm/s AV VTI:            0.228 m AV Peak Grad:      7.8 mmHg AV Mean Grad:      5.0 mmHg LVOT Vmax:         112.00 cm/s LVOT Vmean:        83.900 cm/s LVOT VTI:          0.194 m LVOT/AV VTI ratio: 0.85  AORTA Ao Root diam: 2.50 cm Ao Asc diam:  2.60 cm  SHUNTS Systemic VTI:  0.19 m Systemic Diam: 2.00 cm Gwyndolyn Kaufman MD Electronically signed by Gwyndolyn Kaufman MD Signature Date/Time: 03/17/2021/6:28:04 PM    Final    [place] EKG SITE RITE  Result Date: 03/19/2021 If Site Rite image not attached, placement could not be confirmed due to current cardiac rhythm.   Labs:  CBC: Recent Labs    03/18/21 0325 03/19/21 0255 03/20/21 0600 03/21/21 0840  WBC 25.8* 18.2* 12.1* 13.2*  HGB 11.3* 10.9* 8.5* 9.4*  HCT 34.9* 32.9* 25.4* 27.7*  PLT 421* 321 277 288    COAGS: No results for input(s): INR, APTT in the last 8760 hours.  BMP: Recent Labs    03/17/21 1701 03/18/21 0325 03/20/21 0600 03/21/21 0840  NA 137 138 135 136  K 5.6* 5.5* 3.2* 3.3*  CL 93* 95* 96* 98  CO2 23 21* 24 25  GLUCOSE 130* 118* 163* 133*  BUN 73* 76* 45* 36*  CALCIUM 8.5* 9.0 8.6* 8.3*  CREATININE 10.14* 10.40* 6.44* 4.34*  GFRNONAA  5* 5* 8* 13*    LIVER FUNCTION TESTS: Recent Labs    03/15/21 0855 03/16/21 0545 03/16/21 2200 03/17/21 0328 03/17/21 1701 03/18/21 0325 03/20/21 0600 03/21/21 0840  BILITOT 1.0 1.0  --  1.4*  --  1.0  --   --   AST 26 48*  --  57*  --  30  --   --   ALT 10 14  --  15  --  7  --   --   ALKPHOS 92 98  --  106  --  79  --   --   PROT 7.4 8.5*  --  7.1  --  6.3*  --   --   ALBUMIN 3.2* 2.6*   < > 2.4* 2.0* 2.0* 1.7* 1.6*   < > = values in this interval not displayed.    Assessment and Plan:  -S/p placement of 39F percutaneous transhepatic cholecystostomy (gallbladder) tube 03/16/21.  -Drain unremarkable with sutures/statlock in place. Dressing C/D/I. No redness, bleeding or drainage from site. No OP in gravity bag. Drain flushes with difficulty and does not aspirate. Pt endorses mild pain when flushing. No OP documented in Epic over last 24 hours.  WBC slightly elevated from 12.1 to 13.2 yesterday   -Ordered drain injection to evaluate drain status -Plan dependent on injection result  Call IR with questions or concerns.     Electronically Signed: Tyson Alias, NP 03/21/2021, 11:00 AM   I spent a total of 15 Minutes at the the patient's bedside AND on the patient's White floor or unit, greater than 50% of which was counseling/coordinating care for s/p placement of 39F percutaneous transhepatic cholecystostomy (gallbladder) tube 03/16/21.

## 2021-03-21 NOTE — Progress Notes (Signed)
PHARMACY NOTE:  ANTIMICROBIAL RENAL DOSAGE ADJUSTMENT  Current antimicrobial regimen includes a mismatch between antimicrobial dosage and estimated renal function.  As per policy approved by the Pharmacy & Therapeutics and Medical Executive Committees, the antimicrobial dosage will be adjusted accordingly.  Current antimicrobial dosage:  cefdinir 300mg  q12 hours  Indication: acute cholecystitis  Renal Function:  Estimated Creatinine Clearance: 11.4 mL/min (A) (by C-G formula based on SCr of 4.34 mg/dL (H)). [x]      On intermittent HD, scheduled:MWF []      On CRRT    Antimicrobial dosage has been changed to:  Cefdinir 300 x1 now then 300mg  daily on HD days.   Thank you for allowing pharmacy to be a part of this patient's care.  Erin Hearing PharmD., BCPS Clinical Pharmacist 03/21/2021 2:03 PM

## 2021-03-21 NOTE — Progress Notes (Signed)
SLP Cancellation Note  Patient Details Name: Elijah White MRN: [id number] DOB: [birth year]   Cancelled treatment:       Reason Eval/Treat Not Completed: Patient declined, no reason specified. Pt adamantly refused POs offered by SLP, getting upset when offered and saying that he felt like he was being forced to eat. He did not even want to try more solid foods for potential diet advancement. RN denies any overt coughing during meals at this point. Will f/u as able.     Osie Bond., M.A. [facility] Pager [phone] Office [phone]  03/21/2021, 11:12 AM

## 2021-03-21 NOTE — Progress Notes (Signed)
[facility] Progress Note   Subjective:   Seen in room. Had dialysis yesterday with no UF.  Denies cp/dyspnea this am.   Objective Vitals:   03/20/21 2011 03/21/21 0349 03/21/21 0748 03/21/21 1139  BP: (!) 99/29 (!) 93/22 (!) 91/58 (!) 95/48  Pulse: 80 70 88 79  Resp: 17 14 16 17   Temp: 98 F (36.7 C) 97.8 F (36.6 C) 98.9 F (37.2 C) 98.5 F (36.9 C)  TempSrc: Oral Oral Oral Oral  SpO2: 100% 98% 99% 100%  Weight:      Height:         Additional Objective Labs: Basic Metabolic Panel: Recent Labs  Lab 03/17/21 1701 03/18/21 0325 03/20/21 0600 03/21/21 0840  NA 137 138 135 136  K 5.6* 5.5* 3.2* 3.3*  CL 93* 95* 96* 98  CO2 23 21* 24 25  GLUCOSE 130* 118* 163* 133*  BUN 73* 76* 45* 36*  CREATININE 10.14* 10.40* 6.44* 4.34*  CALCIUM 8.5* 9.0 8.6* 8.3*  PHOS 6.7*  --  4.0 2.5    CBC: Recent Labs  Lab 03/15/21 0855 03/16/21 0251 03/17/21 0328 03/17/21 1701 03/18/21 0325 03/19/21 0255 03/20/21 0600 03/21/21 0840  WBC 19.1*   < > 44.0*   42.7* 31.3* 25.8* 18.2* 12.1* 13.2*  NEUTROABS 16.9*  --  40.8*  --   --   --   --   --   HGB 13.8   < > 13.6   13.2 11.9* 11.3* 10.9* 8.5* 9.4*  HCT 42.1   < > 39.7   40.3 36.6* 34.9* 32.9* 25.4* 27.7*  MCV 106.3*   < > 105.0*   105.5* 104.9* 105.1* 103.8* 102.8* 103.4*  PLT 414*   < > 455*   442* 412* 421* 321 277 288   < > = values in this interval not displayed.    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, blind  Heart: RRR No m,r,g Lungs: Clear bilaterally  Abdomen: soft non-tender  Extremities: No LE edema  Dialysis Access: LUE AVF +bruit   Medications:  sodium chloride 50 mL/hr at 03/21/21 0225    piperacillin-tazobactam (ZOSYN)  IV 2.25 g (03/21/21 0734)    (feeding supplement) PROSource Plus  30 mL Oral BID BM   amiodarone  400 mg Oral BID   apixaban  2.5 mg Oral BID   brimonidine  1 drop Right Eye TID   carbidopa-levodopa  1 tablet Oral TID   Chlorhexidine Gluconate Cloth  6 each Topical Q0600   cinacalcet  60 mg Oral Q M,W,F-HD   darbepoetin (ARANESP) injection - DIALYSIS  40 mcg Intravenous Q Mon-HD   doxercalciferol  2 mcg Intravenous Q M,W,F-HD   insulin aspart  0-6 Units Subcutaneous Q4H   latanoprost  1 drop Right Eye QHS   pantoprazole (PROTONIX) IV  40 mg Intravenous Q24H   pilocarpine  1 drop Right Eye QID   sevelamer carbonate  1,600 mg Oral TID WC   sodium chloride flush  10-40 mL Intracatheter Q12H    Dialysis Orders:  [place] MWF  4h  400/600  66kg 2/2 bath  P2  AVF  Hep none  -  no esa/ Fe  - sensipar 60 mg po tiw  - hectorol 3 ug tiw IV  Assessment/Plan: Acute Cholelithiasis: Per primary. S/P perc chole tube by IR 1/12. Surgery signed off. Per primary.  New onset atrial fib w/ RVR - converted to sinus 01/14 seen by cardiology. Now on PO amiodarone, Eliquis Acute urinary retention/UTI/hydronephrosis. Urology consulted. Foley in place. On IV Zosyn.  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 Back on schedule. Next HD 1/18. 4K bath  BP/volume  - Soft BP's.  Holding home BP meds. Minimal UF with HD.   Anemia  - Hgb 10.9 >8.5. Not on ESA. Got Aranesp 40 on 1/16.  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  Changed to Renvela binders. Lower VDRA dose. Continue sensipar.   Nutrition - Albumin <2. Protein supps ordered. Renal diet.  Parkinson's disease-per primary DMT2-per primary H/O TAVR DNR  [name] PA-C [facility] 03/21/2021,12:27 PM

## 2021-03-21 NOTE — Procedures (Signed)
°  Procedure: Cholangiogram: catheter pulled completely out of gallbladder, with no tract opacification with contrast injection. Catheter removed. Follow clinically, may need catheter replacement if symptoms recur. EBL:   minimal Complications:  none immediate  See full dictation in [facility].  Dillard Cannon MD Main # [phone] Pager  [phone] Mobile [phone]

## 2021-03-22 ENCOUNTER — Encounter (HOSPITAL_COMMUNITY): Payer: Self-pay

## 2021-03-22 DIAGNOSIS — K810 Acute cholecystitis: Secondary | ICD-10-CM | POA: Diagnosis not present

## 2021-03-22 LAB — RENAL FUNCTION PANEL
Albumin: 1.7 g/dL — ABNORMAL LOW (ref 3.5–5.0)
Anion gap: 13 (ref 5–15)
BUN: 53 mg/dL — ABNORMAL HIGH (ref 8–23)
CO2: 24 mmol/L (ref 22–32)
Calcium: 8.6 mg/dL — ABNORMAL LOW (ref 8.9–10.3)
Chloride: 99 mmol/L (ref 98–111)
Creatinine, Ser: 5.46 mg/dL — ABNORMAL HIGH (ref 0.61–1.24)
GFR, Estimated: 10 mL/min — ABNORMAL LOW (ref >60)
Glucose, Bld: 116 mg/dL — ABNORMAL HIGH (ref 70–99)
Phosphorus: 3.3 mg/dL (ref 2.5–4.6)
Potassium: 3.7 mmol/L (ref 3.5–5.1)
Sodium: 136 mmol/L (ref 135–145)

## 2021-03-22 LAB — CBC
HCT: 27.1 % — ABNORMAL LOW (ref 39.0–52.0)
Hemoglobin: 8.9 g/dL — ABNORMAL LOW (ref 13.0–17.0)
MCH: 34.1 pg — ABNORMAL HIGH (ref 26.0–34.0)
MCHC: 32.8 g/dL (ref 30.0–36.0)
MCV: 103.8 fL — ABNORMAL HIGH (ref 80.0–100.0)
Platelets: 324 10*3/uL (ref 150–400)
RBC: 2.61 MIL/uL — ABNORMAL LOW (ref 4.22–5.81)
RDW: 14.7 % (ref 11.5–15.5)
WBC: 13.3 10*3/uL — ABNORMAL HIGH (ref 4.0–10.5)
nRBC: 0.2 % (ref 0.0–0.2)

## 2021-03-22 LAB — CULTURE, BLOOD (ROUTINE X 2)
Culture: NO GROWTH
Culture: NO GROWTH
Special Requests: ADEQUATE

## 2021-03-22 LAB — GLUCOSE, CAPILLARY
Glucose-Capillary: 85 mg/dL (ref 70–99)
Glucose-Capillary: 102 mg/dL — ABNORMAL HIGH (ref 70–99)
Glucose-Capillary: 189 mg/dL — ABNORMAL HIGH (ref 70–99)
Glucose-Capillary: 97 mg/dL (ref 70–99)

## 2021-03-22 LAB — RESP PANEL BY RT-PCR (FLU A&B, COVID) ARPGX2
Influenza A by PCR: NEGATIVE
Influenza B by PCR: NEGATIVE
SARS Coronavirus 2 by RT PCR: NEGATIVE

## 2021-03-22 LAB — TSH: TSH: 1.311 u[IU]/mL (ref 0.350–4.500)

## 2021-03-22 MED ORDER — CHLORHEXIDINE GLUCONATE CLOTH 2 % EX PADS
6.0000 | MEDICATED_PAD | Freq: Every day | CUTANEOUS | Status: DC
  Administered 2021-03-23 – 2021-03-25 (×3): 6 via TOPICAL

## 2021-03-22 NOTE — Progress Notes (Signed)
[facility] Progress Note   Subjective:   Seen in room. No new events overnight. For dialysis today.   Objective Vitals:   03/21/21 1622 03/21/21 2015 03/22/21 0335 03/22/21 0835  BP: (!) 98/36 (!) 89/42 (!) 100/23 111/62  Pulse: 75 72 78 90  Resp: (!) 21 18 18 18   Temp: 98.5 F (36.9 C) (!) 97.5 F (36.4 C) 98.3 F (36.8 C) 98.3 F (36.8 C)  TempSrc: Oral Oral Oral Oral  SpO2: 98% 100% 100% 100%  Weight:      Height:         Additional Objective Labs: Basic Metabolic Panel: Recent Labs  Lab 03/20/21 0600 03/21/21 0840 03/22/21 0639  NA 135 136 136  K 3.2* 3.3* 3.7  CL 96* 98 99  CO2 24 25 24   GLUCOSE 163* 133* 116*  BUN 45* 36* 53*  CREATININE 6.44* 4.34* 5.46*  CALCIUM 8.6* 8.3* 8.6*  PHOS 4.0 2.5 3.3    CBC: Recent Labs  Lab 03/17/21 0328 03/17/21 1701 03/18/21 0325 03/19/21 0255 03/20/21 0600 03/21/21 0840 03/22/21 0639  WBC 44.0*   42.7*   < > 25.8* 18.2* 12.1* 13.2* 13.3*  NEUTROABS 40.8*  --   --   --   --   --   --   HGB 13.6   13.2   < > 11.3* 10.9* 8.5* 9.4* 8.9*  HCT 39.7   40.3   < > 34.9* 32.9* 25.4* 27.7* 27.1*  MCV 105.0*   105.5*   < > 105.1* 103.8* 102.8* 103.4* 103.8*  PLT 455*   442*   < > 421* 321 277 288 324   < > = values in this interval not displayed.    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, blind  Heart: RRR No m,r,g Lungs: Clear bilaterally  Abdomen: soft non-tender  Extremities: No LE edema  Dialysis Access: LUE AVF +bruit   Medications:    (feeding supplement) PROSource Plus  30 mL Oral BID BM   apixaban  2.5 mg Oral BID   brimonidine  1 drop Right Eye TID   carbidopa-levodopa  1 tablet Oral TID    cefdinir  300 mg Oral Q M,W,F-1800   Chlorhexidine Gluconate Cloth  6 each Topical Q0600   cinacalcet  60 mg Oral Q M,W,F-HD   darbepoetin (ARANESP) injection - DIALYSIS  40 mcg Intravenous Q Mon-HD   doxercalciferol  2 mcg Intravenous Q M,W,F-HD   insulin aspart  0-6 Units Subcutaneous Q4H   latanoprost  1 drop Right Eye QHS   pantoprazole (PROTONIX) IV  40 mg Intravenous Q24H   pilocarpine  1 drop Right Eye QID   sevelamer carbonate  1,600 mg Oral TID WC   sodium chloride flush  10-40 mL Intracatheter Q12H    Dialysis Orders:  [place] MWF  4h  400/600  66kg 2/2 bath  P2  AVF  Hep none  - no esa/ Fe  - sensipar 60 mg po tiw  - hectorol 3 ug tiw IV  Assessment/Plan: Acute Cholelithiasis: Per primary. S/P perc chole tube by IR 1/12. Surgery signed off. Per primary.  New onset atrial fib w/  RVR - converted to sinus 01/14 seen by cardiology. Now on PO amiodarone, Eliquis Acute urinary retention/UTI/hydronephrosis. Urology consulted. Foley in place. On IV Zosyn >>now PO Omnicef per ID.  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 Back on schedule. HD today  BP/volume  - Soft BP's.  Holding home BP meds. Well below EDW. Minimal UF with HD.   Anemia  - Hgb 10.9 >8.5. Not on ESA. Got Aranesp 40 on 1/16.  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  Changed to Renvela binders. Lower VDRA dose. Continue sensipar.   Nutrition - Albumin <2. Protein supps ordered. Renal diet.  Parkinson's disease-per primary DMT2-per primary H/O TAVR DNR  [name] PA-C [facility] 03/22/2021,12:19 PM

## 2021-03-22 NOTE — Progress Notes (Addendum)
Progress Note  Patient Name: Elijah White Date of Encounter: 03/22/2021  [facility] Cardiologist: Evalina Field, MD   Subjective   Patient denies any chest pain, palpitations, dizziness, trouble breathing, headache. Reports that he feels overall a bit better than yesterday.   Inpatient Medications    Scheduled Meds:  (feeding supplement) PROSource Plus  30 mL Oral BID BM   amiodarone  400 mg Oral BID   apixaban  2.5 mg Oral BID   brimonidine  1 drop Right Eye TID   carbidopa-levodopa  1 tablet Oral TID   cefdinir  300 mg Oral Q M,W,F-1800   Chlorhexidine Gluconate Cloth  6 each Topical Q0600   cinacalcet  60 mg Oral Q M,W,F-HD   darbepoetin (ARANESP) injection - DIALYSIS  40 mcg Intravenous Q Mon-HD   doxercalciferol  2 mcg Intravenous Q M,W,F-HD   insulin aspart  0-6 Units Subcutaneous Q4H   latanoprost  1 drop Right Eye QHS   pantoprazole (PROTONIX) IV  40 mg Intravenous Q24H   pilocarpine  1 drop Right Eye QID   sevelamer carbonate  1,600 mg Oral TID WC   sodium chloride flush  10-40 mL Intracatheter Q12H   Continuous Infusions:  PRN Meds: acetaminophen, ipratropium-albuterol, ondansetron **OR** ondansetron (ZOFRAN) IV, sodium chloride flush   Vital Signs    Vitals:   03/21/21 1139 03/21/21 1622 03/21/21 2015 03/22/21 0335  BP: (!) 95/48 (!) 98/36 (!) 89/42 (!) 100/23  Pulse: 79 75 72 78  Resp: 17 (!) 21 18 18   Temp: 98.5 F (36.9 C) 98.5 F (36.9 C) (!) 97.5 F (36.4 C) 98.3 F (36.8 C)  TempSrc: Oral Oral Oral Oral  SpO2: 100% 98% 100% 100%  Weight:      Height:        Intake/Output Summary (Last 24 hours) at 03/22/2021 0740 Last data filed at 03/21/2021 2300 Gross per 24 hour  Intake 480 ml  Output --  Net 480 ml   Last 3 Weights 03/20/2021 03/18/2021 03/18/2021  Weight (lbs) 136 lb 14.5 oz 136 lb 3.9 oz 136 lb 3.9 oz  Weight (kg) 62.1 kg 61.8 kg 61.8 kg      Telemetry    Sinus rhythm, HR in the 70s-80s - Personally Reviewed  ECG     No new tracings since 1/13 - Personally Reviewed  Physical Exam   GEN: No acute distress.   Neck: No JVD Cardiac: RRR, no rubs, or gallops. Grad 2/6 systolic murmur at upper right sternal border  Respiratory: Clear to auscultation bilaterally. GI: Soft, nontender, non-distended, percutaneous drain has been removed MS: No edema. Left BKA Neuro:  Nonfocal  Psych: Normal affect   Labs    High Sensitivity Troponin:  No results for input(s): TROPONINIHS in the last 720 hours.   Chemistry Recent Labs  Lab 03/16/21 0545 03/16/21 2200 03/17/21 0328 03/17/21 1701 03/18/21 0325 03/20/21 0600 03/21/21 0840 03/22/21 0639  NA 139   < > 137   < > 138 135 136 136  K 5.4*   < > 6.0*   < > 5.5* 3.2* 3.3* 3.7  CL 85*   < > 91*   < > 95* 96* 98 99  CO2 29   < > 27   < > 21* 24 25 24   GLUCOSE 249*   < > 140*   < > 118* 163* 133* 116*  BUN 41*   < > 64*   < > 76* 45* 36* 53*  CREATININE  8.56*   < > 9.84*   < > 10.40* 6.44* 4.34* 5.46*  CALCIUM 9.2   < > 8.6*   < > 9.0 8.6* 8.3* 8.6*  PROT 8.5*  --  7.1  --  6.3*  --   --   --   ALBUMIN 2.6*   < > 2.4*   < > 2.0* 1.7* 1.6* 1.7*  AST 48*  --  57*  --  30  --   --   --   ALT 14  --  15  --  7  --   --   --   ALKPHOS 98  --  106  --  79  --   --   --   BILITOT 1.0  --  1.4*  --  1.0  --   --   --   GFRNONAA 6*   < > 5*   < > 5* 8* 13* 10*  ANIONGAP 25*   < > 19*   < > 22* 15 13 13    < > = values in this interval not displayed.    Lipids No results for input(s): CHOL, TRIG, HDL, LABVLDL, LDLCALC, CHOLHDL in the last 168 hours.  Hematology Recent Labs  Lab 03/20/21 0600 03/21/21 0840 03/22/21 0639  WBC 12.1* 13.2* 13.3*  RBC 2.47* 2.68* 2.61*  HGB 8.5* 9.4* 8.9*  HCT 25.4* 27.7* 27.1*  MCV 102.8* 103.4* 103.8*  MCH 34.4* 35.1* 34.1*  MCHC 33.5 33.9 32.8  RDW 14.5 14.8 14.7  PLT 277 288 324   Thyroid No results for input(s): TSH, FREET4 in the last 168 hours.  BNPNo results for input(s): BNP, PROBNP in the last 168 hours.   DDimer No results for input(s): DDIMER in the last 168 hours.   Radiology    IR Sinus/Fist Tube Chk-Non GI  Result Date: 03/21/2021 INDICATION: Cholelithiasis, status post percutaneous cholecystostomy catheter placement 03/16/2021. Difficulty flushing , no output. EXAM: CHOLANGIOGRAM THROUGH EXISTING CATHETER MEDICATIONS: none indicated ANESTHESIA/SEDATION: None required FLUOROSCOPY TIME:  6 seconds COMPLICATIONS: None immediate. PROCEDURE: Informed written consent was obtained from the patient after a thorough discussion of the procedural risks, benefits and alternatives. All questions were addressed. Inspection of the right upper quadrant shows significant retraction of the pigtail drain catheter since prior study. Injection shows the catheter is in the subcutaneous tissues. There is no filling of tract to the gallbladder, nor of the gallbladder itself. IMPRESSION: 1. The drain catheter had pulled out completely from the gallbladder with no opacification of the tract or lumen. The catheter was removed. PLAN: Observe clinically. If symptoms recur, patient may need new percutaneous cholecystostomy catheter placement. Electronically Signed   By: Lucrezia Europe M.D.   On: 03/21/2021 17:13    Cardiac Studies   TTE 03/17/2021 1. Left ventricular ejection fraction, by estimation, is 60 to 65%. The  left ventricle has normal function. There is moderate concentric left  ventricular hypertrophy.   2. Right ventricular systolic function is normal. The right ventricular  size is normal.   3. The mitral valve is degenerative. Severe mitral annular calcification.   4. The aortic valve has been repaired/replaced. There is a 26 mm Sapien  prosthetic (TAVR) valve present in the aortic position. Echo findings are  consistent with normal structure and function of the aortic valve  prosthesis. Aortic valve mean gradient  measures 5.0 mmHg. Aortic valve Vmax measures 1.40 m/s. DI 0.8. There is  no paravalvular leak.    5. The inferior vena  cava is normal in size with <50% respiratory  variability, suggesting right atrial pressure of 8 mmHg.   Comparison(s): Compared to prior TTE on 01/2021, there is no significant  change. TAVR valve continues to be well seated with current gradient 23mmHg  (previously 86mmHg).     Patient Profile     81 y.o. male with medical history significant of ESRD on HD MWF, aortic stenosis status post TAVR 2021, nonobstructive CAD, Parkinson's disease, PAD on Plavix, s/p left BKA, HTN, IIDM, HLD,  came with recurrent right upper quadrant abdominal pain.  Thought to have cholecystitis and drain placed by IR.  Stay in hospital complicated by a fib with RVR,  sepsis  2/2 acute cholecystitis ; bld cultures pending s/p perc drain on IV zosyn now in sinus rhythm on oral amio  Assessment & Plan    New onset atrial fibrillation: in the setting of acute chole. Currently in sinus rhythm - Chads2vasc 4.  - Rate control was attempted but the patient had significant RVR with hypotension  - Converted to sinus rhythm on 1/14 on amiodarone, remains in sinus rhythm per telemetry   On amiodarone    Hemodynamics improved    - Anticoagulated with eliquis 2.5mg  BID (patient is ESRD). Transitioned from heparin to oral anticoagulation on 1/15  - Hemoglobin decreased to 8.5 on 1/16 (down from 10.9). 8.9 this AM. OK to continue anticoagulation for now, continue to monitor. Possibly dilutional or related to ESRD - TSH last checked on 11/26/19. Normal at that time, ordered TSH  Acute cholecystitis, sepsis  - s/p percutaneous chole drain, removed on 1/17 - Managed per general surgery, primary team, ID   PAD: s/p BKA  - Continue home statin  - Plavix held. OK to continue to hold until hemoglobin improves   S/p 26 mm Sapien prosthetic (TAVR)  - Normal mean gradient, well seated on echo 03/17/21    HTN  - BP medications held in the setting of low BP    ESRD  - Managed per nephrology  - Per  nephrology note, dialysis scheduled for today         For questions or updates, please contact [place] Please consult [email or web address] for contact info under       Signed, Margie Billet, PA-C  03/22/2021, 7:40 AM     Patient seen and examined   I agree with findings of K Johnson above  Pt appears comfortable in bed but he does have frequent hiccups  Neck:  JVP is normal  Lungs are rel clear Cardiac exam   RRR  No S3   Ext are without edema s/p L BKA  Patient has maintained SR for the past several days  He has no hx of afib until he came in now for cholecystitis. With dialysis 3x per week this should have been picked up  ? Role of long term amiodarone   Clinically he is much improved since drain placed  I would stop amiodarone   Follow    He is on Eliquis  and with dialysis will continue to have close f/u    If no recurrence in next month would d/c anticoagulation

## 2021-03-22 NOTE — Progress Notes (Signed)
Speech Language Pathology Treatment: Dysphagia  Patient Details Name: Elijah White MRN: [id number] DOB: Nov 03, 1940 Today's Date: 03/22/2021 Time: 2800-3491 SLP Time Calculation (min) (ACUTE ONLY): 11 min  Assessment / Plan / Recommendation Clinical Impression  Pt was seen with advanced textures for potential diet advancement. Pt denies needing any modifications to his diet at baseline but then when offered more solid foods, he says he can't because he has "no teeth." He did consume bites of graham cracker with minimally prolonged mastication, and he had only one delayed cough across all PO intake, also including thin liquids. Recommend advancing to Dys 2 (chopped) diet and thin liquids, with potential to advance further pending pt comfort with solid foods and safety also considering mentation.    HPI HPI: Elijah White is an 81 y.o. male with medical history significant of ESRD on HD MWF, aortic stenosis status post TAVR 2021, nonobstructive CAD, Parkinson's disease, PVD on Plavix, s/p left BKA, HTN, IIDM, HLD,  came with recurrent right upper quadrant abdominal pain.  Thought to have cholecystitis and drain placed by IR.  Stay in hospital complicated by a fib with RVR, continued WBC count elevation and abdominal pain.      SLP Plan  Continue with current plan of care      Recommendations for follow up therapy are one component of a multi-disciplinary discharge planning process, led by the attending physician.  Recommendations may be updated based on patient status, additional functional criteria and insurance authorization.    Recommendations  Diet recommendations: Dysphagia 2 (fine chop);Thin liquid Liquids provided via: Cup;Straw Medication Administration: Crushed with puree Supervision: Staff to assist with self feeding Compensations: Minimize environmental distractions;Slow rate;Small sips/bites Postural Changes and/or Swallow Maneuvers: Seated upright 90 degrees;Upright 30-60  min after meal                Oral Care Recommendations: Oral care BID Follow Up Recommendations: Skilled nursing-short term rehab (<3 hours/day) Assistance recommended at discharge: Frequent or constant Supervision/Assistance SLP Visit Diagnosis: Dysphagia, unspecified (R13.10) Plan: Continue with current plan of care           Osie Bond., M.A. [facility] Pager [phone] Office [phone]  03/22/2021, 9:59 AM

## 2021-03-22 NOTE — Progress Notes (Signed)
PROGRESS NOTE    Elijah White  EUM:[id number] DOB: 03-Jun-1940 DOA: 03/15/2021 PCP: Maryella Shivers, MD    Brief Narrative:  81 year old gentleman with history of ESRD on hemodialysis Monday Wednesday Friday, aortic stenosis status post TAVR in 2021, nonobstructive CAD, Parkinson's disease, peripheral vascular disease on Plavix, status post left BKA, hypertension, type 2 diabetes presented to the ER with recurrent right upper quadrant abdominal pain.  Patient was diagnosed with acute cholecystitis, a drain was placed by IR.  He was also found to be with A. fib and RVR.  Patient was noted to have urinary retention along with pyuria.   Assessment & Plan:   Severe sepsis present on admission secondary to pyocystitis/acute cholecystitis: -Foley catheter was placed and seen by urology, urine is still draining grossly purulent. -Continue Foley catheter, abdomen pain improved after relief of obstruction.  Urine culture 1/14 with no growth.  Blood cultures no growth.  Initially treated with broad-spectrum antibiotics, changed to [facility] for total 2 weeks to treat as complicated UTI. -I have communicated with urology that patient will be discharged with Foley catheter and they can follow-up outpatient.  Acute cholecystitis: Cholecystostomy drain placed 1/12, removed 1/17.  Clinically improving. Blood cultures negative.  Surgical cultures with no growth so far.  Antibiotics as above.  ESRD on hemodialysis: On a schedule for dialysis today.  A. fib with RVR: Seen by cardiology.  Initially on amiodarone that is discontinued now.  Currently on sinus rhythm. Amiodarone is stopped.  On Eliquis.  Cardiology plan to follow-up outpatient and if no recurrent A. fib, they want to stop anticoagulation.  Continue to work and mobilize with PT OT.  Anticipate discharge back to SNF tomorrow.   DVT prophylaxis: apixaban (ELIQUIS) tablet 2.5 mg Start: 03/19/21 1030 apixaban (ELIQUIS) tablet 2.5 mg   Code  Status: DNR Family Communication: None at the bedside Disposition Plan: Status is: Inpatient  Remains inpatient appropriate because: Active treatment.  Dialysis.         Consultants:  Cardiology Nephrology Intervention radiology General surgery  Procedures:  Cholecystostomy drain 1/12, removal 1/17  Antimicrobials:  Anti-infectives (From admission, onward)    Start     Dose/Rate Route Frequency Ordered Stop   03/22/21 1800  cefdinir (OMNICEF) capsule 300 mg        300 mg Oral Every M-W-F (1800) 03/21/21 1400 03/29/21 2359   03/21/21 1500  cefdinir (OMNICEF) capsule 300 mg        300 mg Oral  Once 03/21/21 1400 03/21/21 1631   03/21/21 1445  cefdinir (OMNICEF) capsule 300 mg  Status:  Discontinued        300 mg Oral Every 12 hours 03/21/21 1354 03/21/21 1400   03/15/21 1400  piperacillin-tazobactam (ZOSYN) IVPB 2.25 g  Status:  Discontinued        2.25 g 100 mL/hr over 30 Minutes Intravenous Every 8 hours 03/15/21 1326 03/21/21 1354   03/15/21 1330  Ampicillin-Sulbactam (UNASYN) 3 g in sodium chloride 0.9 % 100 mL IVPB  Status:  Discontinued        3 g 200 mL/hr over 30 Minutes Intravenous Every evening 03/15/21 1324 03/15/21 1326          Subjective: Patient seen and examined.  Today he denies any complaints.  Patient is blind.  He denies any abdominal pain or dysuria.  Denies any chest pain or palpitations. Telemetry shows sinus rhythm.  Objective: Vitals:   03/21/21 2015 03/22/21 0335 03/22/21 0835 03/22/21 1300  BP: (!) 89/42 [name])  100/23 111/62 (!) 101/42  Pulse: 72 78 90 98  Resp: 18 18 18 20   Temp: (!) 97.5 F (36.4 C) 98.3 F (36.8 C) 98.3 F (36.8 C) 98.1 F (36.7 C)  TempSrc: Oral Oral Oral Oral  SpO2: 100% 100% 100% 99%  Weight:      Height:        Intake/Output Summary (Last 24 hours) at 03/22/2021 1320 Last data filed at 03/22/2021 0846 Gross per 24 hour  Intake 640 ml  Output --  Net 640 ml   Filed Weights   03/18/21 1240 03/18/21 1554  03/20/21 1421  Weight: 61.8 kg 61.8 kg 62.1 kg    Examination:  General exam: Appears calm and comfortable  Debilitated.  Chronically sick looking.  Not in any distress. Respiratory system: Clear to auscultation. Respiratory effort normal. Cardiovascular system: S1 & S2 heard, RRR. No JVD, murmurs, rubs, gallops or clicks.  Gastrointestinal system: Soft.  Nondistended.  Bowel sound present. Left upper extremity AV fistula present with thrill. Left lower extremity BKA stump. Foley catheter with grossly purulent urine.  Flowing freely.   Data Reviewed: I have personally reviewed following labs and imaging studies  CBC: Recent Labs  Lab 03/17/21 0328 03/17/21 1701 03/18/21 0325 03/19/21 0255 03/20/21 0600 03/21/21 0840 03/22/21 0639  WBC 44.0*   42.7*   < > 25.8* 18.2* 12.1* 13.2* 13.3*  NEUTROABS 40.8*  --   --   --   --   --   --   HGB 13.6   13.2   < > 11.3* 10.9* 8.5* 9.4* 8.9*  HCT 39.7   40.3   < > 34.9* 32.9* 25.4* 27.7* 27.1*  MCV 105.0*   105.5*   < > 105.1* 103.8* 102.8* 103.4* 103.8*  PLT 455*   442*   < > 421* 321 277 288 324   < > = values in this interval not displayed.   Basic Metabolic Panel: Recent Labs  Lab 03/16/21 2200 03/17/21 0328 03/17/21 1701 03/18/21 0325 03/20/21 0600 03/21/21 0840 03/22/21 0639  NA 139   < > 137 138 135 136 136  K 6.1*   < > 5.6* 5.5* 3.2* 3.3* 3.7  CL 88*   < > 93* 95* 96* 98 99  CO2 28   < > 23 21* 24 25 24   GLUCOSE 170*   < > 130* 118* 163* 133* 116*  BUN 59*   < > 73* 76* 45* 36* 53*  CREATININE 9.69*   < > 10.14* 10.40* 6.44* 4.34* 5.46*  CALCIUM 9.2   < > 8.5* 9.0 8.6* 8.3* 8.6*  PHOS 6.6*  --  6.7*  --  4.0 2.5 3.3   < > = values in this interval not displayed.   GFR: Estimated Creatinine Clearance: 9.1 mL/min (A) (by C-G formula based on SCr of 5.46 mg/dL (H)). Liver Function Tests: Recent Labs  Lab 03/16/21 0545 03/16/21 2200 03/17/21 0328 03/17/21 1701 03/18/21 0325 03/20/21 0600 03/21/21 0840  03/22/21 0639  AST 48*  --  57*  --  30  --   --   --   ALT 14  --  15  --  7  --   --   --   ALKPHOS 98  --  106  --  79  --   --   --   BILITOT 1.0  --  1.4*  --  1.0  --   --   --   PROT 8.5*  --  7.1  --  6.3*  --   --   --   ALBUMIN 2.6*   < > 2.4* 2.0* 2.0* 1.7* 1.6* 1.7*   < > = values in this interval not displayed.   No results for input(s): LIPASE, AMYLASE in the last 168 hours. No results for input(s): AMMONIA in the last 168 hours. Coagulation Profile: No results for input(s): INR, PROTIME in the last 168 hours. Cardiac Enzymes: No results for input(s): CKTOTAL, CKMB, CKMBINDEX, TROPONINI in the last 168 hours. BNP (last 3 results) No results for input(s): PROBNP in the last 8760 hours. HbA1C: No results for input(s): HGBA1C in the last 72 hours. CBG: Recent Labs  Lab 03/21/21 1142 03/21/21 1623 03/21/21 2109 03/22/21 0341 03/22/21 0737  GLUCAP 155* 130* 111* 85 102*   Lipid Profile: No results for input(s): CHOL, HDL, LDLCALC, TRIG, CHOLHDL, LDLDIRECT in the last 72 hours. Thyroid Function Tests: Recent Labs    03/22/21 1045  TSH 1.311   Anemia Panel: No results for input(s): VITAMINB12, FOLATE, FERRITIN, TIBC, IRON, RETICCTPCT in the last 72 hours. Sepsis Labs: Recent Labs  Lab 03/16/21 2323 03/17/21 0328 03/17/21 1701  LATICACIDVEN 2.1* 1.8 1.2    Recent Results (from the past 240 hour(s))  Resp Panel by RT-PCR (Flu A&B, Covid) Nasopharyngeal Swab     Status: None   Collection Time: 03/15/21  1:35 PM   Specimen: Nasopharyngeal Swab; Nasopharyngeal(NP) swabs in vial transport medium  Result Value Ref Range Status   SARS Coronavirus 2 by RT PCR NEGATIVE NEGATIVE Final    Comment: (NOTE) SARS-CoV-2 target nucleic acids are NOT DETECTED.  The SARS-CoV-2 RNA is generally detectable in upper respiratory specimens during the acute phase of infection. The lowest concentration of SARS-CoV-2 viral copies this assay can detect is 138 copies/mL. A  negative result does not preclude SARS-Cov-2 infection and should not be used as the sole basis for treatment or other patient management decisions. A negative result may occur with  improper specimen collection/handling, submission of specimen other than nasopharyngeal swab, presence of viral mutation(s) within the areas targeted by this assay, and inadequate number of viral copies(<138 copies/mL). A negative result must be combined with clinical observations, patient history, and epidemiological information. The expected result is Negative.  Fact Sheet for Patients:  EntrepreneurPulse.com.au  Fact Sheet for Healthcare Providers:  IncredibleEmployment.be  This test is no t yet approved or cleared by the [place] FDA and  has been authorized for detection and/or diagnosis of SARS-CoV-2 by FDA under an Emergency Use Authorization (EUA). This EUA will remain  in effect (meaning this test can be used) for the duration of the COVID-19 declaration under Section 564(b)(1) of the Act, 21 U.S.C.section 360bbb-3(b)(1), unless the authorization is terminated  or revoked sooner.       Influenza A by PCR NEGATIVE NEGATIVE Final   Influenza B by PCR NEGATIVE NEGATIVE Final    Comment: (NOTE) The Xpert Xpress SARS-CoV-2/FLU/RSV plus assay is intended as an aid in the diagnosis of influenza from Nasopharyngeal swab specimens and should not be used as a sole basis for treatment. Nasal washings and aspirates are unacceptable for Xpert Xpress SARS-CoV-2/FLU/RSV testing.  Fact Sheet for Patients: EntrepreneurPulse.com.au  Fact Sheet for Healthcare Providers: IncredibleEmployment.be  This test is not yet approved or cleared by the [place] FDA and has been authorized for detection and/or diagnosis of SARS-CoV-2 by FDA under an Emergency Use Authorization (EUA). This EUA will remain in effect (meaning this test can  be used) for the duration of the COVID-19 declaration under Section 564(b)(1) of the Act, 21 U.S.C. section 360bbb-3(b)(1), unless the authorization is terminated or revoked.  Performed at [facility], [place] [address] [place], [place] [address]   Body fluid culture w Gram Stain     Status: None   Collection Time: 03/16/21  8:16 AM   Specimen: BILE; Body Fluid  Result Value Ref Range Status   Specimen Description BILE  Final   Special Requests BILE  Final   Gram Stain NO WBC SEEN NO ORGANISMS SEEN   Final   Culture   Final    NO GROWTH 3 DAYS Performed at [facility], [place] [address] [place], [place] [address]    Report Status 03/19/2021 FINAL  Final  Culture, blood (routine x 2)     Status: None   Collection Time: 03/17/21  3:07 AM   Specimen: BLOOD RIGHT HAND  Result Value Ref Range Status   Specimen Description BLOOD RIGHT HAND  Final   Special Requests AEROBIC BOTTLE ONLY Blood Culture adequate volume  Final   Culture   Final    NO GROWTH 5 DAYS Performed at [facility], [place] [address], [place], [place] [address]    Report Status 03/22/2021 FINAL  Final  Culture, blood (routine x 2)     Status: None   Collection Time: 03/17/21  3:17 AM   Specimen: BLOOD RIGHT HAND  Result Value Ref Range Status   Specimen Description BLOOD RIGHT HAND  Final   Special Requests   Final    AEROBIC BOTTLE ONLY Blood Culture results may not be optimal due to an inadequate volume of blood received in culture bottles   Culture   Final    NO GROWTH 5 DAYS Performed at [facility], [place] [address], [place], [place] [address]    Report Status 03/22/2021 FINAL  Final  Urine Culture     Status: None   Collection Time: 03/18/21 10:30 AM   Specimen: In/Out Cath Urine  Result Value Ref Range Status   Specimen Description IN/OUT CATH URINE  Final   Special Requests NONE  Final   Culture   Final    NO GROWTH Performed at [facility], [place] [address],  [place], [place] [address]    Report Status 03/19/2021 FINAL  Final         Radiology Studies: No results found.      Scheduled Meds:  (feeding supplement) PROSource Plus  30 mL Oral BID BM   apixaban  2.5 mg Oral BID   brimonidine  1 drop Right Eye TID   carbidopa-levodopa  1 tablet Oral TID   cefdinir  300 mg Oral Q M,W,F-1800   Chlorhexidine Gluconate Cloth  6 each Topical Q0600   [START ON 03/23/2021] Chlorhexidine Gluconate Cloth  6 each Topical Q0600   cinacalcet  60 mg Oral Q M,W,F-HD   darbepoetin (ARANESP) injection - DIALYSIS  40 mcg Intravenous Q Mon-HD   doxercalciferol  2 mcg Intravenous Q M,W,F-HD   insulin aspart  0-6 Units Subcutaneous Q4H   latanoprost  1 drop Right Eye QHS   pantoprazole (PROTONIX) IV  40 mg Intravenous Q24H   pilocarpine  1 drop Right Eye QID   sevelamer carbonate  1,600 mg Oral TID WC   sodium chloride flush  10-40 mL Intracatheter Q12H   Continuous Infusions:   LOS: 7 days    Time spent:  34 minutes    Barb Merino, MD [facility] Pager [phone]

## 2021-03-22 NOTE — TOC Progression Note (Signed)
Transition of Care [facility]) - Progression Note    Patient Details  Name: Elijah White MRN: [id number] Date of Birth: Nov 23, 1940  Transition of Care [facility]) CM/SW [place], [place] Phone Number: 03/22/2021, 1:40 PM  Clinical Narrative:     CSW spoke with Nicki from [facility] who confirmed they can accept patient tomorrow if medically ready for dc. CSW will continue to follow and assist with patients dc planning needs.  Expected Discharge Plan: Skilled Nursing Facility Barriers to Discharge: Continued Medical Work up  Expected Discharge Plan and Services Expected Discharge Plan: [place] In-house Referral: Clinical Social Work     Living arrangements for the past 2 months: [place]                                       Social Determinants of Health (SDOH) Interventions    Readmission Risk Interventions Readmission Risk Prevention Plan 03/17/2021  Transportation Screening Complete  HRI or Home Care Consult Complete  SW Recovery Care/Counseling Consult Complete  Skilled Nursing Facility Complete  Some recent data might be hidden

## 2021-03-23 ENCOUNTER — Inpatient Hospital Stay (HOSPITAL_COMMUNITY): Payer: Medicare Other

## 2021-03-23 LAB — GLUCOSE, CAPILLARY
Glucose-Capillary: 104 mg/dL — ABNORMAL HIGH (ref 70–99)
Glucose-Capillary: 109 mg/dL — ABNORMAL HIGH (ref 70–99)
Glucose-Capillary: 115 mg/dL — ABNORMAL HIGH (ref 70–99)
Glucose-Capillary: 128 mg/dL — ABNORMAL HIGH (ref 70–99)
Glucose-Capillary: 130 mg/dL — ABNORMAL HIGH (ref 70–99)

## 2021-03-23 LAB — CBC
HCT: 23.8 % — ABNORMAL LOW (ref 39.0–52.0)
Hemoglobin: 7.9 g/dL — ABNORMAL LOW (ref 13.0–17.0)
MCH: 33.8 pg (ref 26.0–34.0)
MCHC: 33.2 g/dL (ref 30.0–36.0)
MCV: 101.7 fL — ABNORMAL HIGH (ref 80.0–100.0)
Platelets: 350 10*3/uL (ref 150–400)
RBC: 2.34 MIL/uL — ABNORMAL LOW (ref 4.22–5.81)
RDW: 14.6 % (ref 11.5–15.5)
WBC: 11.3 10*3/uL — ABNORMAL HIGH (ref 4.0–10.5)
nRBC: 0.3 % — ABNORMAL HIGH (ref 0.0–0.2)

## 2021-03-23 LAB — OCCULT BLOOD X 1 CARD TO LAB, STOOL: Fecal Occult Bld: POSITIVE — AB

## 2021-03-23 MED ORDER — FOLIC ACID 1 MG PO TABS
1.0000 mg | ORAL_TABLET | Freq: Every day | ORAL | Status: DC
  Administered 2021-03-23 – 2021-03-26 (×4): 1 mg via ORAL
  Filled 2021-03-23 (×4): qty 1

## 2021-03-23 MED ORDER — CEFDINIR 300 MG PO CAPS: 300.0000 mg | ORAL_CAPSULE | ORAL | Status: DC

## 2021-03-23 NOTE — Progress Notes (Signed)
Speech Language Pathology Treatment: Dysphagia  Patient Details Name: Elijah White MRN: [id number] DOB: 26-Oct-1940 Today's Date: 03/23/2021 Time: 7672-0947 SLP Time Calculation (min) (ACUTE ONLY): 15 min  Assessment / Plan / Recommendation Clinical Impression  Pt seen at lunch with dys2/thin liquid consistencies. RN reports pt has tolerated meds without difficulty. Pt indicated he has difficulty with some solids, because of his poor dentition, but he is ready to get off pureed foods. Pt managed saltine cracker well with minimal oral residue. No overt s/s aspiration of cracker or thin liquid. Will continue dys2 diet. This texture is likely to be appropriate going forward due to dentition. ST to sign off at this time. Please reconsult if needs arise.    HPI HPI: Elijah White is an 81 y.o. male with medical history significant of ESRD on HD MWF, aortic stenosis status post TAVR 2021, nonobstructive CAD, Parkinson's disease, PVD on Plavix, s/p left BKA, HTN, IIDM, HLD,  came with recurrent right upper quadrant abdominal pain.  Thought to have cholecystitis and drain placed by IR.  Stay in hospital complicated by a fib with RVR, continued WBC count elevation and abdominal pain.      SLP Plan  Discharge SLP treatment due to goals met      Recommendations for follow up therapy are one component of a multi-disciplinary discharge planning process, led by the attending physician.  Recommendations may be updated based on patient status, additional functional criteria and insurance authorization.    Recommendations  Diet recommendations: Dysphagia 2 (fine chop);Thin liquid Liquids provided via: Cup;Straw Medication Administration: Crushed with puree Supervision: Staff to assist with self feeding Compensations: Minimize environmental distractions;Slow rate;Small sips/bites Postural Changes and/or Swallow Maneuvers: Seated upright 90 degrees;Upright 30-60 min after meal                Oral  Care Recommendations: Oral care BID Follow Up Recommendations: Skilled nursing-short term rehab (<3 hours/day) Assistance recommended at discharge: Frequent or constant Supervision/Assistance SLP Visit Diagnosis: Dysphagia, unspecified (R13.10) Plan: Discharge SLP treatment due to goals met         [name] B. Quentin Ore, [facility], [place] Speech Language Pathologist Office: [phone]  Shonna Chock  03/23/2021, 12:51 PM

## 2021-03-23 NOTE — Progress Notes (Signed)
PROGRESS NOTE    Elijah White  MWU:[id number] DOB: 1941/02/17 DOA: 03/15/2021 PCP: Maryella Shivers, MD    Chief Complaint  Patient presents with   Abdominal Pain    Brief Narrative:  Elijah White is a 81 y.o. male with medical history significant of ESRD on HD MWF, aortic stenosis status post TAVR 2021, nonobstructive CAD, Parkinson's disease, PVD on Plavix, s/p left BKA, HTN, IIDM, HLD,  came with recurrent right upper quadrant abdominal pain, found to have severe sepsis from pyelocystitis/acute cholecystitis, also has new onset of A. fib RVR  Subjective:  He is legally blind, in bed not in acute distress, he denies pain Hgb dropped without overt external bleeding There is no fever  Assessment & Plan:   Principal Problem:   Acute cholecystitis Active Problems:   Complicated UTI (urinary tract infection)   Severe sepsis from pyocystis/acute cholecystitis , presents on admission -finally starting to improve -cultures unrevealing -ID Dr Gale Journey input  appreciated , plan for [facility] renal dosing until 1/25   Acute urinary retention/UTI/hydronephrosis/complicated uti -- Foley placed, follow up with urology Dr Cain Sieve for foley management/ removal       cholelithiasis/ suspect early acute cholecystitis with clinical Murphy sign positive.  -He presents with Acute RUQ abdominal pain with leucocytosis -CT scan suggestive of perhaps a Right sided PNA-- on abx already-- continue pulmonary toilet -was on IV abx (zosyn)- seen by ID ,change to Po abx -GS consult/ IR consult for percutaneous drain, drain removed on 1/17 -hgb dropped without overt external bleed, continue to have hiccups, repeat ct ab/pel  -discussed with IR -f/u with general surgery  Macrocytic anemia -Drop off hemoglobin without overt external bleed -There is concern of bleed in gallbladder, awaiting for IR recommendation -Per chart review she is status post colonoscopy in 11/2019 which showed several polyps  status post removal and internal hemorrhoids otherwise unremarkable colonoscopy -will check FOBT, b12 -PRBC transfusion if hemoglobin less than 7, start folic acid supplement -Cardiology recommend hold Eliquis for now -Repeat CBC in the morning       A fib with RVR, new diagnosis -dig x 1 and brief amiodarone , currently only on eliquis, he is back to sinus rhyrhm -cards consult much appreciated, he may not need long term antiarrythmic meds or anticoagulation -Eliquis discontinued per cardiology recommendation due to concern of acute anemia - he is to follow up with cards closely  PAD s/p left BKA, on plavix prior to admission, this has been on hold since admission   HTN -holding home meds due to hypotension  ESRD on HD MWF -nephrology consulted.   Parkinson's disease -continue Sinemet    Diabetic neuropathy -Continue gabapentin    Noninsulin-dependent type 2 diabetes/diabetic neuropathy --Appear has been diet controlled, recent A1c 7% -SSI for now  Legally blind  FTT, will benefit from palliative care following at skilled nursing facility    Body mass index is 24.25 kg/m..  .  Unresulted Labs (From admission, onward)     Start     Ordered   03/24/21 0500  Iron and TIBC  Tomorrow morning,   R       Question:  Specimen collection method  Answer:  Lab=Lab collect   03/23/21 1003   03/24/21 0500  Ferritin  Tomorrow morning,   R       Question:  Specimen collection method  Answer:  Lab=Lab collect   03/23/21 1003   03/24/21 0500  Vitamin B12  Tomorrow morning,   R  Question:  Specimen collection method  Answer:  Lab=Lab collect   03/23/21 1003   03/19/21 0500  CBC  Daily,   R     Question:  Specimen collection method  Answer:  Lab=Lab collect   03/18/21 1242   03/17/21 1526  Urinalysis, Routine w reflex microscopic In/Out Cath Urine  Once,   R        03/17/21 1525   Unscheduled  Occult blood card to lab, stool  As needed,   R      03/23/21 1002               DVT prophylaxis: scd's for now, concerning for acute anemia   Code Status: DNR Family Communication: Patient, daughter updated over the phone Disposition:   Status is: Inpatient   Dispo: The patient is from: SNF              Anticipated d/c is to: SNF              Anticipated d/c date is: Pending hemoglobin stabilization and clearance by cardiology and IR                Consultants:  General surgery IR ID Cardiology Urology  Procedures:  Gallbladder drain placement and removal  Antimicrobials:   Anti-infectives (From admission, onward)    Start     Dose/Rate Route Frequency Ordered Stop   03/24/21 0000  cefdinir (OMNICEF) 300 MG capsule        300 mg Oral Every M-W-F (1800) 03/23/21 0957 03/29/21 2359   03/22/21 1800  cefdinir (OMNICEF) capsule 300 mg        300 mg Oral Every M-W-F (1800) 03/21/21 1400 03/29/21 2359   03/21/21 1500  cefdinir (OMNICEF) capsule 300 mg        300 mg Oral  Once 03/21/21 1400 03/21/21 1631   03/21/21 1445  cefdinir (OMNICEF) capsule 300 mg  Status:  Discontinued        300 mg Oral Every 12 hours 03/21/21 1354 03/21/21 1400   03/15/21 1400  piperacillin-tazobactam (ZOSYN) IVPB 2.25 g  Status:  Discontinued        2.25 g 100 mL/hr over 30 Minutes Intravenous Every 8 hours 03/15/21 1326 03/21/21 1354   03/15/21 1330  Ampicillin-Sulbactam (UNASYN) 3 g in sodium chloride 0.9 % 100 mL IVPB  Status:  Discontinued        3 g 200 mL/hr over 30 Minutes Intravenous Every evening 03/15/21 1324 03/15/21 1326           Objective: Vitals:   03/22/21 1819 03/22/21 1931 03/23/21 0311 03/23/21 0821  BP: (!) 122/55 (!) 105/57 92/66 105/74  Pulse: 75 84 88 92  Resp: 17 17 17 17   Temp:  97.6 F (36.4 C) 98.4 F (36.9 C) 98 F (36.7 C)  TempSrc:  Oral Oral Oral  SpO2: 100% 100% 100%   Weight: 65.8 kg  66.1 kg   Height:        Intake/Output Summary (Last 24 hours) at 03/23/2021 1409 Last data filed at 03/23/2021 0700 Gross per 24  hour  Intake 120 ml  Output 250 ml  Net -130 ml   Filed Weights   03/22/21 1509 03/22/21 1819 03/23/21 0311  Weight: 65.8 kg 65.8 kg 66.1 kg    Examination:  General exam: Chronically ill-appearing , legally blind , oriented x3 , NAD Respiratory system: Clear to auscultation. Respiratory effort normal. Cardiovascular system:  RRR.  Gastrointestinal system: Abdomen is nondistended,  soft and nontender.  Normal bowel sounds heard. Central nervous system: Alert and oriented. No focal neurological deficits. Extremities: Left BKA, right lower extremity in heel protector, able to lift against gravity Skin: No rashes, lesions or ulcers Psychiatry: Judgement and insight appear normal. Mood & affect appropriate.     Data Reviewed: I have personally reviewed following labs and imaging studies  CBC: Recent Labs  Lab 03/17/21 0328 03/17/21 1701 03/19/21 0255 03/20/21 0600 03/21/21 0840 03/22/21 0639 03/23/21 0722  WBC 44.0*   42.7*   < > 18.2* 12.1* 13.2* 13.3* 11.3*  NEUTROABS 40.8*  --   --   --   --   --   --   HGB 13.6   13.2   < > 10.9* 8.5* 9.4* 8.9* 7.9*  HCT 39.7   40.3   < > 32.9* 25.4* 27.7* 27.1* 23.8*  MCV 105.0*   105.5*   < > 103.8* 102.8* 103.4* 103.8* 101.7*  PLT 455*   442*   < > 321 277 288 324 350   < > = values in this interval not displayed.    Basic Metabolic Panel: Recent Labs  Lab 03/16/21 2200 03/17/21 0328 03/17/21 1701 03/18/21 0325 03/20/21 0600 03/21/21 0840 03/22/21 0639  NA 139   < > 137 138 135 136 136  K 6.1*   < > 5.6* 5.5* 3.2* 3.3* 3.7  CL 88*   < > 93* 95* 96* 98 99  CO2 28   < > 23 21* 24 25 24   GLUCOSE 170*   < > 130* 118* 163* 133* 116*  BUN 59*   < > 73* 76* 45* 36* 53*  CREATININE 9.69*   < > 10.14* 10.40* 6.44* 4.34* 5.46*  CALCIUM 9.2   < > 8.5* 9.0 8.6* 8.3* 8.6*  PHOS 6.6*  --  6.7*  --  4.0 2.5 3.3   < > = values in this interval not displayed.    GFR: Estimated Creatinine Clearance: 9.1 mL/min (A) (by C-G formula  based on SCr of 5.46 mg/dL (H)).  Liver Function Tests: Recent Labs  Lab 03/17/21 0328 03/17/21 1701 03/18/21 0325 03/20/21 0600 03/21/21 0840 03/22/21 0639  AST 57*  --  30  --   --   --   ALT 15  --  7  --   --   --   ALKPHOS 106  --  79  --   --   --   BILITOT 1.4*  --  1.0  --   --   --   PROT 7.1  --  6.3*  --   --   --   ALBUMIN 2.4* 2.0* 2.0* 1.7* 1.6* 1.7*    CBG: Recent Labs  Lab 03/22/21 1228 03/22/21 2146 03/23/21 0324 03/23/21 0736 03/23/21 1200  GLUCAP 189* 97 104* 109* 115*     Recent Results (from the past 240 hour(s))  Resp Panel by RT-PCR (Flu A&B, Covid) Nasopharyngeal Swab     Status: None   Collection Time: 03/15/21  1:35 PM   Specimen: Nasopharyngeal Swab; Nasopharyngeal(NP) swabs in vial transport medium  Result Value Ref Range Status   SARS Coronavirus 2 by RT PCR NEGATIVE NEGATIVE Final    Comment: (NOTE) SARS-CoV-2 target nucleic acids are NOT DETECTED.  The SARS-CoV-2 RNA is generally detectable in upper respiratory specimens during the acute phase of infection. The lowest concentration of SARS-CoV-2 viral copies this assay can detect is 138 copies/mL. A negative result does not preclude  SARS-Cov-2 infection and should not be used as the sole basis for treatment or other patient management decisions. A negative result may occur with  improper specimen collection/handling, submission of specimen other than nasopharyngeal swab, presence of viral mutation(s) within the areas targeted by this assay, and inadequate number of viral copies(<138 copies/mL). A negative result must be combined with clinical observations, patient history, and epidemiological information. The expected result is Negative.  Fact Sheet for Patients:  EntrepreneurPulse.com.au  Fact Sheet for Healthcare Providers:  IncredibleEmployment.be  This test is no t yet approved or cleared by the [place] FDA and  has been authorized  for detection and/or diagnosis of SARS-CoV-2 by FDA under an Emergency Use Authorization (EUA). This EUA will remain  in effect (meaning this test can be used) for the duration of the COVID-19 declaration under Section 564(b)(1) of the Act, 21 U.S.C.section 360bbb-3(b)(1), unless the authorization is terminated  or revoked sooner.       Influenza A by PCR NEGATIVE NEGATIVE Final   Influenza B by PCR NEGATIVE NEGATIVE Final    Comment: (NOTE) The Xpert Xpress SARS-CoV-2/FLU/RSV plus assay is intended as an aid in the diagnosis of influenza from Nasopharyngeal swab specimens and should not be used as a sole basis for treatment. Nasal washings and aspirates are unacceptable for Xpert Xpress SARS-CoV-2/FLU/RSV testing.  Fact Sheet for Patients: EntrepreneurPulse.com.au  Fact Sheet for Healthcare Providers: IncredibleEmployment.be  This test is not yet approved or cleared by the [place] FDA and has been authorized for detection and/or diagnosis of SARS-CoV-2 by FDA under an Emergency Use Authorization (EUA). This EUA will remain in effect (meaning this test can be used) for the duration of the COVID-19 declaration under Section 564(b)(1) of the Act, 21 U.S.C. section 360bbb-3(b)(1), unless the authorization is terminated or revoked.  Performed at [facility], [place] [address] [place], [place] [address]   Body fluid culture w Gram Stain     Status: None   Collection Time: 03/16/21  8:16 AM   Specimen: BILE; Body Fluid  Result Value Ref Range Status   Specimen Description BILE  Final   Special Requests BILE  Final   Gram Stain NO WBC SEEN NO ORGANISMS SEEN   Final   Culture   Final    NO GROWTH 3 DAYS Performed at [facility], [place] [address], [place], [place] [address]    Report Status 03/19/2021 FINAL  Final  Culture, blood (routine x 2)     Status: None   Collection Time: 03/17/21  3:07 AM   Specimen: BLOOD RIGHT HAND   Result Value Ref Range Status   Specimen Description BLOOD RIGHT HAND  Final   Special Requests AEROBIC BOTTLE ONLY Blood Culture adequate volume  Final   Culture   Final    NO GROWTH 5 DAYS Performed at [facility], [place] [address], [place], [place] [address]    Report Status 03/22/2021 FINAL  Final  Culture, blood (routine x 2)     Status: None   Collection Time: 03/17/21  3:17 AM   Specimen: BLOOD RIGHT HAND  Result Value Ref Range Status   Specimen Description BLOOD RIGHT HAND  Final   Special Requests   Final    AEROBIC BOTTLE ONLY Blood Culture results may not be optimal due to an inadequate volume of blood received in culture bottles   Culture   Final    NO GROWTH 5 DAYS Performed at [facility], [address] [place], [place] [address]    Report Status 03/22/2021 FINAL  Final  Urine Culture     Status: None   Collection Time: 03/18/21 10:30 AM   Specimen: In/Out Cath Urine  Result Value Ref Range Status   Specimen Description IN/OUT CATH URINE  Final   Special Requests NONE  Final   Culture   Final    NO GROWTH Performed at [facility], [place] [address] [place], [place] [address]    Report Status 03/19/2021 FINAL  Final  Resp Panel by RT-PCR (Flu A&B, Covid) Nasopharyngeal Swab     Status: None   Collection Time: 03/22/21 12:42 PM   Specimen: Nasopharyngeal Swab; Nasopharyngeal(NP) swabs in vial transport medium  Result Value Ref Range Status   SARS Coronavirus 2 by RT PCR NEGATIVE NEGATIVE Final    Comment: (NOTE) SARS-CoV-2 target nucleic acids are NOT DETECTED.  The SARS-CoV-2 RNA is generally detectable in upper respiratory specimens during the acute phase of infection. The lowest concentration of SARS-CoV-2 viral copies this assay can detect is 138 copies/mL. A negative result does not preclude SARS-Cov-2 infection and should not be used as the sole basis for treatment or other patient management decisions. A negative result may occur  with  improper specimen collection/handling, submission of specimen other than nasopharyngeal swab, presence of viral mutation(s) within the areas targeted by this assay, and inadequate number of viral copies(<138 copies/mL). A negative result must be combined with clinical observations, patient history, and epidemiological information. The expected result is Negative.  Fact Sheet for Patients:  EntrepreneurPulse.com.au  Fact Sheet for Healthcare Providers:  IncredibleEmployment.be  This test is no t yet approved or cleared by the [place] FDA and  has been authorized for detection and/or diagnosis of SARS-CoV-2 by FDA under an Emergency Use Authorization (EUA). This EUA will remain  in effect (meaning this test can be used) for the duration of the COVID-19 declaration under Section 564(b)(1) of the Act, 21 U.S.C.section 360bbb-3(b)(1), unless the authorization is terminated  or revoked sooner.       Influenza A by PCR NEGATIVE NEGATIVE Final   Influenza B by PCR NEGATIVE NEGATIVE Final    Comment: (NOTE) The Xpert Xpress SARS-CoV-2/FLU/RSV plus assay is intended as an aid in the diagnosis of influenza from Nasopharyngeal swab specimens and should not be used as a sole basis for treatment. Nasal washings and aspirates are unacceptable for Xpert Xpress SARS-CoV-2/FLU/RSV testing.  Fact Sheet for Patients: EntrepreneurPulse.com.au  Fact Sheet for Healthcare Providers: IncredibleEmployment.be  This test is not yet approved or cleared by the [place] FDA and has been authorized for detection and/or diagnosis of SARS-CoV-2 by FDA under an Emergency Use Authorization (EUA). This EUA will remain in effect (meaning this test can be used) for the duration of the COVID-19 declaration under Section 564(b)(1) of the Act, 21 U.S.C. section 360bbb-3(b)(1), unless the authorization is terminated  or revoked.  Performed at [facility], [place] [address] [place], [place] [address]          Radiology Studies: CT ABDOMEN PELVIS WO CONTRAST  Result Date: 03/23/2021 CLINICAL DATA:  Postoperative RIGHT upper quadrant pain, headache ups, fall in hemoglobin, post percutaneous cholecystostomy on 03/16/2021 with removal of biliary drain on 03/21/2021 EXAM: CT ABDOMEN AND PELVIS WITHOUT CONTRAST TECHNIQUE: Multidetector CT imaging of the abdomen and pelvis was performed following the standard protocol without IV contrast. RADIATION DOSE REDUCTION: This exam was performed according to the departmental  dose-optimization program which includes automated exposure control, adjustment of the mA and/or kV according to patient size and/or use of iterative reconstruction technique. COMPARISON:  03/17/2021 FINDINGS: Lower chest: Small BILATERAL pleural effusions, RIGHT greater than LEFT. Significant atelectasis of RIGHT lower lobe. Minimal compressive atelectasis posterior LEFT lower lobe. Coronary arterial calcifications noted with evidence of TAVR. Hepatobiliary: Interval removal of cholecystostomy tube. Dependent calculi within gallbladder with additional new intermediate attenuation material within gallbladder lumen likely representing blood. Gallbladder remains distended. Liver unremarkable. Pancreas: Normal appearance Spleen: Normal appearance.  Small splenule adjacent to spleen. Adrenals/Urinary Tract: 2.5 x 2.1 cm diameter homogeneous LEFT adrenal mass, 32 HU attenuation on noncontrast imaging, probable adrenal adenoma. RIGHT adrenal gland unremarkable. No renal mass or hydronephrosis. Foley catheter decompresses urinary bladder. No ureteral dilatation. Stomach/Bowel: Normal appendix. Stomach and bowel loops normal appearance. Vascular/Lymphatic: Atherosclerotic calcifications aorta and iliac arteries. No adenopathy. Reproductive: Seed implants at prostate gland. Other: Small amount of low-attenuation  free fluid in pelvis and perihepatic. No free air. Small umbilical hernia containing fat. Musculoskeletal: Bones demineralized. IMPRESSION: Interval removal of cholecystostomy tube. Persistent gallbladder distention with dependent calculi and additional intermediate attenuation material within gallbladder lumen likely representing blood/hemorrhage. Small amount of nonspecific low-attenuation free intraperitoneal fluid perihepatic and in pelvis. 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 Small BILATERAL pleural effusions and atelectasis greater on RIGHT. Small umbilical hernia containing fat. Aortic Atherosclerosis (ICD10-I70.0). Electronically Signed   By: Lavonia Dana M.D.   On: 03/23/2021 13:34        Scheduled Meds:  (feeding supplement) PROSource Plus  30 mL Oral BID BM   brimonidine  1 drop Right Eye TID   carbidopa-levodopa  1 tablet Oral TID   cefdinir  300 mg Oral Q M,W,F-1800   Chlorhexidine Gluconate Cloth  6 each Topical Q0600   Chlorhexidine Gluconate Cloth  6 each Topical Q0600   cinacalcet  60 mg Oral Q M,W,F-HD   darbepoetin (ARANESP) injection - DIALYSIS  40 mcg Intravenous Q Mon-HD   doxercalciferol  2 mcg Intravenous Q M,W,F-HD   folic acid  1 mg Oral Daily   insulin aspart  0-6 Units Subcutaneous Q4H   latanoprost  1 drop Right Eye QHS   pantoprazole (PROTONIX) IV  40 mg Intravenous Q24H   pilocarpine  1 drop Right Eye QID   sevelamer carbonate  1,600 mg Oral TID WC   sodium chloride flush  10-40 mL Intracatheter Q12H   Continuous Infusions:   LOS: 8 days   Time spent: 66mins Greater than 50% of this time was spent in counseling, explanation of diagnosis, planning of further management, and coordination of care.   Voice Recognition Viviann Spare dictation system was used to create this note, attempts have been made to correct errors. Please contact the author with questions and/or clarifications.   Florencia Reasons, MD PhD FACP [facility]  Available via Epic secure  chat 7am-7pm for nonurgent issues Please page for urgent issues To page the attending provider between 7A-7P or the covering provider during after hours 7P-7A, please log into the web site [email or web address] and access using universal [place] password for that web site. If you do not have the password, please call the hospital operator.    03/23/2021, 2:09 PM

## 2021-03-23 NOTE — Progress Notes (Signed)
[facility] Progress Note   Subjective:   Seen in room. Has no complaints, says hoping to discharge soon.   Objective Vitals:   03/22/21 1819 03/22/21 1931 03/23/21 0311 03/23/21 0821  BP: (!) 122/55 (!) 105/57 92/66 105/74  Pulse: 75 84 88 92  Resp: 17 17 17 17   Temp:  97.6 F (36.4 C) 98.4 F (36.9 C) 98 F (36.7 C)  TempSrc:  Oral Oral Oral  SpO2: 100% 100% 100%   Weight: 65.8 kg  66.1 kg   Height:         Additional Objective Labs: Basic Metabolic Panel: Recent Labs  Lab 03/20/21 0600 03/21/21 0840 03/22/21 0639  NA 135 136 136  K 3.2* 3.3* 3.7  CL 96* 98 99  CO2 24 25 24   GLUCOSE 163* 133* 116*  BUN 45* 36* 53*  CREATININE 6.44* 4.34* 5.46*  CALCIUM 8.6* 8.3* 8.6*  PHOS 4.0 2.5 3.3    CBC: Recent Labs  Lab 03/17/21 0328 03/17/21 1701 03/19/21 0255 03/20/21 0600 03/21/21 0840 03/22/21 0639 03/23/21 0722  WBC 44.0*   42.7*   < > 18.2* 12.1* 13.2* 13.3* 11.3*  NEUTROABS 40.8*  --   --   --   --   --   --   HGB 13.6   13.2   < > 10.9* 8.5* 9.4* 8.9* 7.9*  HCT 39.7   40.3   < > 32.9* 25.4* 27.7* 27.1* 23.8*  MCV 105.0*   105.5*   < > 103.8* 102.8* 103.4* 103.8* 101.7*  PLT 455*   442*   < > 321 277 288 324 350   < > = values in this interval not displayed.    Blood Culture    Component Value Date/Time   SDES IN/OUT CATH URINE 03/18/2021 1030   SPECREQUEST NONE 03/18/2021 1030   CULT  03/18/2021 1030    NO GROWTH Performed at 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, blind  Heart: RRR No m,r,g Lungs: Clear bilaterally  Abdomen: soft non-tender  Extremities: No LE edema  Dialysis Access: LUE AVF +bruit   Medications:    (feeding supplement) PROSource Plus  30 mL Oral BID BM   apixaban  2.5 mg Oral BID   brimonidine  1 drop Right Eye TID   carbidopa-levodopa  1 tablet Oral TID   cefdinir  300 mg Oral Q  M,W,F-1800   Chlorhexidine Gluconate Cloth  6 each Topical Q0600   Chlorhexidine Gluconate Cloth  6 each Topical Q0600   cinacalcet  60 mg Oral Q M,W,F-HD   darbepoetin (ARANESP) injection - DIALYSIS  40 mcg Intravenous Q Mon-HD   doxercalciferol  2 mcg Intravenous Q M,W,F-HD   folic acid  1 mg Oral Daily   insulin aspart  0-6 Units Subcutaneous Q4H   latanoprost  1 drop Right Eye QHS   pantoprazole (PROTONIX) IV  40 mg Intravenous Q24H   pilocarpine  1 drop Right Eye QID   sevelamer carbonate  1,600 mg Oral TID WC   sodium chloride flush  10-40 mL Intracatheter Q12H    Dialysis Orders:  [place] MWF  4h  400/600  66kg 2/2 bath  P2  AVF  Hep none  - no esa/ Fe  - sensipar 60 mg po tiw  - hectorol 3 ug tiw IV  Assessment/Plan: Acute Cholelithiasis: Per primary. S/P perc  chole tube by IR 1/12. Tube pulled out 1/17.  Surgery signed off. Per primary/IR  New onset atrial fib w/ RVR - converted to sinus 1/14 seen by cardiology. On Eliquis for now.  Acute urinary retention/UTI/hydronephrosis. Urology consulted. Foley in place. On IV Zosyn >>now PO Omnicef per ID.  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 Back on schedule. Next HD 1/20 BP/volume  - Soft BP's.  Holding home BP meds. Well below EDW. Minimal UF with HD.   Anemia  - Hgb 10.9 >8.5.>7.9  Started ESA. Got Aranesp 40 on 1/16.  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  Changed to Renvela binders. Lower VDRA dose. Continue sensipar.   Nutrition - Albumin <2. Protein supps ordered. Renal diet.  Parkinson's disease-per primary DMT2-per primary H/O TAVR DNR  Elijah Child PA-C [facility] 03/23/2021,11:41 AM

## 2021-03-23 NOTE — TOC Progression Note (Signed)
Transition of Care [facility]) - Progression Note    Patient Details  Name: Elijah White MRN: [id number] Date of Birth: December 15, 1940  Transition of Care [facility] CM/SW [place], [place] Phone Number: 03/23/2021, 4:33 PM  Clinical Narrative:     Patient has SNF bed at [facility] Rehab when medically ready. CSW will continue to follow and assist with patients dc planning needs.  Expected Discharge Plan: [place] Barriers to Discharge: No Barriers Identified  Expected Discharge Plan and Services Expected Discharge Plan: [place] In-house Referral: Clinical Social Work     Living arrangements for the past 2 months: [place] Expected Discharge Date: 03/23/21                                     Social Determinants of Health (SDOH) Interventions    Readmission Risk Interventions Readmission Risk Prevention Plan 03/17/2021  Transportation Screening Complete  HRI or Home Care Consult Complete  SW Recovery Care/Counseling Consult Complete  Skilled Nursing Facility Complete  Some recent data might be hidden

## 2021-03-23 NOTE — Progress Notes (Signed)
Referring Physician(s): CCS  Supervising Physician: Simonne Come  Patient Status:  [facility] - In-pt  Chief Complaint:  Acute cholecystitis s/p perc chole tube placement with IR on 03/16/21. Cholangiogram on 03/21/21 showed the tube completely out of GB, tube was removed.   Subjective:  Patient laying in bed, NAD. Denies fever, chills, abd pain, n/v.  Wants to go home.   Allergies: Patient has no known allergies.  Medications: Prior to Admission medications   Medication Sig Start Date End Date Taking? Authorizing Provider  acetaminophen (TYLENOL) 500 MG tablet Take 1,000 mg by mouth every 6 (six) hours as needed for moderate pain or headache.   Yes [provider]  aspirin EC 81 MG tablet Take 81 mg by mouth in the morning. Swallow whole.   Yes [provider]  atorvastatin (LIPITOR) 10 MG tablet Take 1 tablet (10 mg total) by mouth in the morning. 08/16/20  Yes Rhyne, Samantha J, PA-C  AURYXIA 1 GM 210 MG(Fe) tablet Take 420 mg by mouth 3 (three) times daily with meals. 05/30/20  Yes [provider]  B Complex-C-Folic Acid (DIALYVITE 800) 0.8 MG TABS Take 1 tablet by mouth in the morning. 05/08/19  Yes [provider]  brimonidine (ALPHAGAN) 0.15 % ophthalmic solution Place 1 drop into the right eye 3 (three) times daily.   Yes [provider]  carbidopa-levodopa (SINEMET) 25-100 MG tablet Take 1 tablet by mouth 3 (three) times daily. Patient taking differently: Take 1 tablet by mouth 3 (three) times daily. 1 TABLET @ 8am/ 12pm/ 4pm 10/19/20 03/15/21 Yes Curatolo, Adam, DO  clopidogrel (PLAVIX) 75 MG tablet Take 1 tablet (75 mg total) by mouth daily with breakfast. 07/25/20  Yes Cephus Shelling, MD  docusate sodium (COLACE) 100 MG capsule Take 100 mg by mouth 2 (two) times daily.   Yes [provider]  dorzolamide-timolol (COSOPT) 22.3-6.8 MG/ML ophthalmic solution Place 1 drop into the right eye 2 (two) times daily. 06/25/17  Yes  [provider]  gabapentin (NEURONTIN) 100 MG capsule Take 1 capsule (100 mg total) by mouth daily. 10/19/20 03/15/21 Yes Curatolo, Adam, DO  isosorbide mononitrate (IMDUR) 30 MG 24 hr tablet Take 1 tablet (30 mg total) by mouth in the morning. 08/16/20  Yes Rhyne, Ames Coupe, PA-C  Melatonin 5 MG CAPS Take 5 mg by mouth at bedtime.   Yes [provider]  pantoprazole (PROTONIX) 40 MG tablet Take 1 tablet (40 mg total) by mouth daily at 12 noon. 08/16/20  Yes Rhyne, Ames Coupe, PA-C  pilocarpine (PILOCAR) 4 % ophthalmic solution Place 1 drop into the right eye 4 (four) times daily.    Yes [provider]  polyethylene glycol (MIRALAX / GLYCOLAX) 17 g packet Take 17 g by mouth at bedtime.   Yes [provider]  ROCKLATAN 0.02-0.005 % SOLN Place 1 drop into the right eye at bedtime. 06/20/20  Yes [provider]  blood glucose meter kit and supplies KIT Dispense based on patient and insurance preference. Use up to four times daily as directed. (FOR ICD-9 250.00, 250.01). For QAC - HS accuchecks. 03/30/18   Leroy Sea, MD  carvedilol (COREG) 3.125 MG tablet Take 1 tablet (3.125 mg total) by mouth 2 (two) times daily with a meal. Patient not taking: Reported on 03/15/2021 08/16/20   Dara Lords, PA-C  cefdinir (OMNICEF) 300 MG capsule Take 1 capsule (300 mg total) by mouth every Monday, Wednesday, and Friday at 6 PM for 5  days. 03/24/21 03/29/21  Florencia Reasons, MD  Doxercalciferol (HECTOROL IV) Doxercalciferol (Hectorol) 08/09/20 08/28/21  [provider]  Lancets (ONETOUCH DELICA PLUS [id number]) [place]  03/31/18   [provider]  Methoxy PEG-Epoetin Beta (MIRCERA IJ) [facility]a 09/05/20 09/04/21  [provider]  ONETOUCH VERIO test strip USE AS DIRECTED TO TEST FOUR TIMES A DAY 07/31/18   [name], MD  oxyCODONE-acetaminophen (PERCOCET) 5-325 MG tablet Take 1 tablet by mouth every 6 (six) hours as needed (pain). Patient not taking:  Reported on 03/15/2021 08/19/20   Karoline Caldwell, PA-C     Vital Signs: BP 105/74 (BP Location: Right Leg)    Pulse 92    Temp 98 F (36.7 C) (Oral)    Resp 17    Ht $R'5\' 5"'Cy$  (1.651 m)    Wt 145 lb 11.6 oz (66.1 kg)    SpO2 100%    BMI 24.25 kg/m   Physical Exam Vitals reviewed.  Constitutional:      General: He is not in acute distress. HENT:     Head: Normocephalic and atraumatic.  Pulmonary:     Effort: Pulmonary effort is normal.  Abdominal:     General: Abdomen is flat.     Palpations: Abdomen is soft.     Tenderness: There is no abdominal tenderness. There is no guarding or rebound. Negative signs include Murphy's sign.  Skin:    General: Skin is warm and dry.     Coloration: Skin is not jaundiced.  Neurological:     Mental Status: He is alert and oriented to person, place, and time.  Psychiatric:        Mood and Affect: Mood normal.        Behavior: Behavior normal.    Imaging: No results found.  Labs:  CBC: Recent Labs    03/20/21 0600 03/21/21 0840 03/22/21 0639 03/23/21 0722  WBC 12.1* 13.2* 13.3* 11.3*  HGB 8.5* 9.4* 8.9* 7.9*  HCT 25.4* 27.7* 27.1* 23.8*  PLT 277 288 324 350    COAGS: No results for input(s): INR, APTT in the last 8760 hours.  BMP: Recent Labs    03/18/21 0325 03/20/21 0600 03/21/21 0840 03/22/21 0639  NA 138 135 136 136  K 5.5* 3.2* 3.3* 3.7  CL 95* 96* 98 99  CO2 21* $Remov'24 25 24  'fQVefB$ GLUCOSE 118* 163* 133* 116*  BUN 76* 45* 36* 53*  CALCIUM 9.0 8.6* 8.3* 8.6*  CREATININE 10.40* 6.44* 4.34* 5.46*  GFRNONAA 5* 8* 13* 10*    LIVER FUNCTION TESTS: Recent Labs    03/15/21 0855 03/16/21 0545 03/16/21 2200 03/17/21 0328 03/17/21 1701 03/18/21 0325 03/20/21 0600 03/21/21 0840 03/22/21 0639  BILITOT 1.0 1.0  --  1.4*  --  1.0  --   --   --   AST 26 48*  --  57*  --  30  --   --   --   ALT 10 14  --  15  --  7  --   --   --   ALKPHOS 92 98  --  106  --  79  --   --   --   PROT 7.4 8.5*  --  7.1  --  6.3*  --   --   --    ALBUMIN 3.2* 2.6*   < > 2.4*   < > 2.0* 1.7* 1.6* 1.7*   < > = values in this interval not displayed.    Assessment and Plan:  81 y.o. male with acute cholecystitis s/p perc chole tube placement with IR on 03/16/21, cholangiogram on 03/21/21 showed the tube completely out of gallbladder, with no tract opacification with contrast injection. Dr. Vernard Gambles recommended to follow the patient clinically, patient may need new perc chole placement if symptoms recur.  Patient appears to be stable, no complaints today.  Physical exam negative for acute cholecystitis.  Afebrile, WBC trending down. Patient has follow up with CCS after d/c.   Further treatment plan per TRH/Cardiology/ nephrology Appreciate and agree with the plan.  Please call IR for questions and concerns.    Electronically Signed: Tera Mater, PA-C 03/23/2021, 10:52 AM   I spent a total of 15 Minutes at the the patient's bedside AND on the patient's hospital floor or unit, greater than 50% of which was counseling/coordinating care for acute cholecystitis.   This chart was dictated using voice recognition software.  Despite best efforts to proofread,  errors can occur which can change the documentation meaning.

## 2021-03-23 NOTE — Plan of Care (Signed)
°  Problem: Education: Goal: Knowledge of General Education information will improve Description: Including pain rating scale, medication(s)/side effects and non-pharmacologic comfort measures Outcome: Progressing   Problem: Clinical Measurements: Goal: Ability to maintain clinical measurements within normal limits will improve Outcome: Progressing   Problem: Clinical Measurements: Goal: Respiratory complications will improve Outcome: Progressing   Problem: Activity: Goal: Risk for activity intolerance will decrease Outcome: Progressing   Problem: Elimination: Goal: Will not experience complications related to urinary retention Outcome: Progressing   Problem: Elimination: Goal: Will not experience complications related to bowel motility Outcome: Progressing   Problem: Pain Managment: Goal: General experience of comfort will improve Outcome: Progressing   Problem: Skin Integrity: Goal: Risk for impaired skin integrity will decrease Outcome: Progressing

## 2021-03-23 NOTE — Progress Notes (Addendum)
Progress Note  Patient Name: Elijah White Date of Encounter: 03/23/2021  [facility] Cardiologist: Evalina Field, MD   Subjective   Patient reports that he is doing well this AM. Denies any chest pain, SOB, palpitations, dizziness. Is looking forward to going home.   Inpatient Medications    Scheduled Meds:  (feeding supplement) PROSource Plus  30 mL Oral BID BM   apixaban  2.5 mg Oral BID   brimonidine  1 drop Right Eye TID   carbidopa-levodopa  1 tablet Oral TID   cefdinir  300 mg Oral Q M,W,F-1800   Chlorhexidine Gluconate Cloth  6 each Topical Q0600   Chlorhexidine Gluconate Cloth  6 each Topical Q0600   cinacalcet  60 mg Oral Q M,W,F-HD   darbepoetin (ARANESP) injection - DIALYSIS  40 mcg Intravenous Q Mon-HD   doxercalciferol  2 mcg Intravenous Q M,W,F-HD   insulin aspart  0-6 Units Subcutaneous Q4H   latanoprost  1 drop Right Eye QHS   pantoprazole (PROTONIX) IV  40 mg Intravenous Q24H   pilocarpine  1 drop Right Eye QID   sevelamer carbonate  1,600 mg Oral TID WC   sodium chloride flush  10-40 mL Intracatheter Q12H   Continuous Infusions:  PRN Meds: acetaminophen, ipratropium-albuterol, ondansetron **OR** ondansetron (ZOFRAN) IV, sodium chloride flush   Vital Signs    Vitals:   03/22/21 1816 03/22/21 1819 03/22/21 1931 03/23/21 0311  BP: (!) 127/28 (!) 122/55 (!) 105/57 92/66  Pulse: 76 75 84 88  Resp: (!) 21 17 17 17   Temp:   97.6 F (36.4 C) 98.4 F (36.9 C)  TempSrc:   Oral Oral  SpO2: 100% 100% 100% 100%  Weight:  65.8 kg  66.1 kg  Height:        Intake/Output Summary (Last 24 hours) at 03/23/2021 0649 Last data filed at 03/23/2021 0313 Gross per 24 hour  Intake 400 ml  Output 250 ml  Net 150 ml   Last 3 Weights 03/23/2021 03/22/2021 03/22/2021  Weight (lbs) 145 lb 11.6 oz 145 lb 1 oz 145 lb 1 oz  Weight (kg) 66.1 kg 65.8 kg 65.8 kg      Telemetry    Sinus rhythm, HR in 80s-90s  - Personally Reviewed  ECG    No new tracings since  1/13 - Personally Reviewed  Physical Exam   GEN: No acute distress.   Neck: No JVD Cardiac: RRR, no rubs or gallops. Grade 2/6 systolic murmur at right upper sternal border Respiratory: Clear to auscultation bilaterally. GI: Soft, mildly tender to palpation, non-distended.  MS: No edema; Left BKA Neuro:  Nonfocal  Psych: Normal affect   Labs    High Sensitivity Troponin:  No results for input(s): TROPONINIHS in the last 720 hours.   Chemistry Recent Labs  Lab 03/17/21 0328 03/17/21 1701 03/18/21 0325 03/20/21 0600 03/21/21 0840 03/22/21 0639  NA 137   < > 138 135 136 136  K 6.0*   < > 5.5* 3.2* 3.3* 3.7  CL 91*   < > 95* 96* 98 99  CO2 27   < > 21* 24 25 24   GLUCOSE 140*   < > 118* 163* 133* 116*  BUN 64*   < > 76* 45* 36* 53*  CREATININE 9.84*   < > 10.40* 6.44* 4.34* 5.46*  CALCIUM 8.6*   < > 9.0 8.6* 8.3* 8.6*  PROT 7.1  --  6.3*  --   --   --  ALBUMIN 2.4*   < > 2.0* 1.7* 1.6* 1.7*  AST 57*  --  30  --   --   --   ALT 15  --  7  --   --   --   ALKPHOS 106  --  79  --   --   --   BILITOT 1.4*  --  1.0  --   --   --   GFRNONAA 5*   < > 5* 8* 13* 10*  ANIONGAP 19*   < > 22* 15 13 13    < > = values in this interval not displayed.    Lipids No results for input(s): CHOL, TRIG, HDL, LABVLDL, LDLCALC, CHOLHDL in the last 168 hours.  Hematology Recent Labs  Lab 03/20/21 0600 03/21/21 0840 03/22/21 0639  WBC 12.1* 13.2* 13.3*  RBC 2.47* 2.68* 2.61*  HGB 8.5* 9.4* 8.9*  HCT 25.4* 27.7* 27.1*  MCV 102.8* 103.4* 103.8*  MCH 34.4* 35.1* 34.1*  MCHC 33.5 33.9 32.8  RDW 14.5 14.8 14.7  PLT 277 288 324   Thyroid  Recent Labs  Lab 03/22/21 1045  TSH 1.311    BNPNo results for input(s): BNP, PROBNP in the last 168 hours.  DDimer No results for input(s): DDIMER in the last 168 hours.   Radiology    No results found.  Cardiac Studies   TTE 03/17/2021 1. Left ventricular ejection fraction, by estimation, is 60 to 65%. The  left ventricle has normal  function. There is moderate concentric left  ventricular hypertrophy.   2. Right ventricular systolic function is normal. The right ventricular  size is normal.   3. The mitral valve is degenerative. Severe mitral annular calcification.   4. The aortic valve has been repaired/replaced. There is a 26 mm Sapien  prosthetic (TAVR) valve present in the aortic position. Echo findings are  consistent with normal structure and function of the aortic valve  prosthesis. Aortic valve mean gradient  measures 5.0 mmHg. Aortic valve Vmax measures 1.40 m/s. DI 0.8. There is  no paravalvular leak.   5. The inferior vena cava is normal in size with <50% respiratory  variability, suggesting right atrial pressure of 8 mmHg.   Comparison(s): Compared to prior TTE on 01/2021, there is no significant  change. TAVR valve continues to be well seated with current gradient 98mmHg  (previously 80mmHg).   Patient Profile     81 y.o. male with medical history significant of ESRD on HD MWF, aortic stenosis status post TAVR 2021, nonobstructive CAD, Parkinson's disease, PAD on Plavix, s/p left BKA, HTN, IIDM, HLD,  came with recurrent right upper quadrant abdominal pain.  Thought to have cholecystitis and drain placed by IR.  Stay in hospital complicated by a fib with RVR,  sepsis  2/2 acute cholecystitis ; bld cultures pending s/p perc drain on IV zosyn now in sinus rhythm. Taken off amiodarone on 1/18 and remains in sinus rhythm   Assessment & Plan    New onset atrial fibrillation: in the setting of acute chole. Currently in sinus rhythm - Chads2vasc 4.  - Rate control was attempted but the patient had significant RVR with hypotension  - Converted to sinus rhythm on 1/14 on amiodarone. As afib occurred in the setting of acute chole/sepsis and he has no history of afib,  he may not require long term amiodarone therapy. Taken off oral amiodarone yesterday 1/18. Remains in sinus rhythm, no episodes of afib identified on  telemetry.  Since he  has dialysis 3 days a week and cardiology follow up, he will be closely monitored for a fib reoccurrence.  - Hemodynamics improved    - Anticoagulated with eliquis 2.5mg  BID (patient is ESRD). Transitioned from heparin to oral anticoagulation on 1/15. If patient does not have afib reoccurrence in the next month, would discontinue anticoagulation.  - Hemoglobin decreased to 8.5 on 1/16 (down from 10.9). Hemoglobin 7.9 this AM. No signs of active bleeding, likely anemia of chronic disease. Recommend checking CBC in 1 week at follow up. Possibly dilutional - TSH normal on 1/18 - Scheduled for follow up appointments with afib clinic, cardiology    Acute cholecystitis, sepsis  - s/p percutaneous chole drain, removed on 1/17 - Managed per general surgery, primary team, ID   PAD: s/p BKA  - Continue home statin  - Plavix held. Plan to restart as hemoglobin improves (CBC this AM pending)    S/p 26 mm Sapien prosthetic (TAVR)  - Normal mean gradient, well seated on echo 03/17/21    ESRD  - Managed per nephrology   For questions or updates, please contact [place] Please consult [email or web address] for contact info under        Signed, Margie Billet, PA-C  03/23/2021, 6:49 AM    Pateint seen and examined   Agree with findings as noted above by Sandria Senter.    Pt breathing OK   Cont to complain of hiccups  Neck:  JVP is normal  Lungs are CTA Cardiac RRR   No S3  I/VI systolic murmru  Ext without edema  Pt has had no recurrence of afib    Note that this admit was the only documented afib the patient has ever had   Converted relatively quick to SR with amiodarone   Now off of it   On Eliquis  Conservatively would like to follow pt to see if recurrence in which case would readd amiodarone if rates fast or if  symptomatic  Note that his Hgb has continued to decline slowly since admit   It has been up / down in past  On dialysis   No obvious bleeding    Will need  close follow up   If bleeding found or continues to decline would stop Eliquis    Patient has dialsysi tomorrow   Could he get labs there ?    [name] MD

## 2021-03-23 NOTE — Discharge Summary (Incomplete)
Discharge Summary  [name] YKZ:[id number] DOB: 26-Sep-1940  PCP: Maryella Shivers, MD  Admit date: 03/15/2021 Discharge date: 03/23/2021  Time spent: 6mins, more than 50% time spent on coordination of care.   Recommendations for Outpatient Follow-up:  F/u with SNF MD  for hospital discharge follow up, repeat cbc/bmp at follow up F/u with nephrology, continue HD MWF F/u with cardiology F/u with general surgery F/u with urology , Dr Cain Sieve urology to decide on duration of foley      Discharge Diagnoses:  Active Hospital Problems   Diagnosis Date Noted   Acute cholecystitis 93/90/3009   Complicated UTI (urinary tract infection)     Resolved Hospital Problems  No resolved problems to display.    Discharge Condition: stable  Diet recommendation: Diet recommendations: carb modified , Dysphagia 2 (fine chop);Thin liquid Liquids provided via: Cup;Straw Medication Administration: Crushed with puree Supervision: Staff to assist with self feeding Compensations: Minimize environmental distractions;Slow rate;Small sips/bites Postural Changes and/or Swallow Maneuvers: Seated upright 90 degrees;Upright 30-60 min after meal  Filed Weights   03/22/21 1509 03/22/21 1819 03/23/21 0311  Weight: 65.8 kg 65.8 kg 66.1 kg    History of present illness: ( per admitting MD Dr Roosevelt Locks) *Chief Complaint: Elijah White hurts   HPI: Elijah White is a 81 y.o. male with medical history significant of ESRD on HD MWF, aortic stenosis status post TAVR 2021, nonobstructive CAD, Parkinson's disease, PVD on Plavix, s/p left BKA, HTN, IIDM, HLD,  came with recurrent right upper quadrant abdominal pain.   Symptoms started last night, woke up with 10/10 cramping-like RUQ abdominal pain, nonradiating, associated with nauseous.  Episodic, each episode last 30 to 40 minutes, used some OTC pain medications with minimum help.  Overnight, to throw up of stomach content, could not tell the color of the vomitus  given the poor eyesight at baseline.  No diarrhea, denied any fever or chills.  Denies any chest pain, no shortness of breath.  Denied any such abdominal pain in the past.   ED Course: Afebrile, no tachycardia.   RUQ ultrasound showed cholelithiasis, LFTs within normal limits.  WBC 19.1, creatinine 7.2, BUN 28, K4.5.   Patient continues to experience severe RUQ abdominal pain, fentanyl x2 given.  Hospital Course:  Principal Problem:   Acute cholecystitis Active Problems:   Complicated UTI (urinary tract infection)  Severe sepsis from pyocystis/acute cholecystitis , presents on admission -finally starting to improve -cultures unrevealing -ID Dr Gale Journey input  appreciated , plan for [facility] renal dosing until 1/25  Acute urinary retention/UTI/hydronephrosis/complicated uti -- Foley placed, follow up with urology Dr Cain Sieve for foley management/ removal     cholelithiasis/ suspect early acute cholecystitis with clinical Murphy sign positive.  -He presents with Acute RUQ abdominal pain with leucocytosis -CT scan suggestive of perhaps a Right sided PNA-- on abx already-- continue pulmonary toilet -was on IV abx (zosyn)- seen by ID ,change to Po abx -GS consult/ IR consult for percutaneous drain, drain removed on 1/17 -f/u with general surgery    ESRD on HD MWF -nephrology consulted.    A fib with RVR, new diagnosis -dig x 1 and brief amiodarone , currently only on eliquis, he is back to sinus rhyrhm -cards consult much appreciated, he may not need long term antiarrythmic meds or anticoagulation - he is to follow up with cards closely  HTN -holding home meds due to hypotension   Parkinson's disease -continue Sinemet    Diabetic neuropathy -Continue gabapentin    Hyperglycemia -SSI  for now    Procedures: *  Consultations: *  Discharge Exam: BP 105/74 (BP Location: Right Leg)    Pulse 92    Temp 98 F (36.7 C) (Oral)    Resp 17    Ht 5\' 5"  (1.651 m)    Wt 66.1 kg     SpO2 100%    BMI 24.25 kg/m   General: * Cardiovascular: * Respiratory: *  Discharge Instructions You were cared for by a hospitalist during your hospital stay. If you have any questions about your discharge medications or the care you received while you were in the hospital after you are discharged, you can call the unit and asked to speak with the hospitalist on call if the hospitalist that took care of you is not available. Once you are discharged, your primary care physician will handle any further medical issues. Please note that NO REFILLS for any discharge medications will be authorized once you are discharged, as it is imperative that you return to your primary care physician (or establish a relationship with a primary care physician if you do not have one) for your aftercare needs so that they can reassess your need for medications and monitor your lab values.  Discharge Instructions     Diet general   Complete by: As directed    Diet recommendations: Dysphagia 2 (fine chop);Thin liquid Liquids provided via: Cup;Straw Medication Administration: Crushed with puree Supervision: Staff to assist with self feeding Compensations: Minimize environmental distractions;Slow rate;Small sips/bites Postural Changes and/or Swallow Maneuvers: Seated upright 90 degrees;Upright 30-60 min after meal   Increase activity slowly   Complete by: As directed       Allergies as of 03/23/2021   No Known Allergies   Med Rec must be completed prior to using this [place]***      No Known Allergies  Follow-up Information     Maryella Shivers, MD. Call .   Specialty: Family Medicine Why: As needed Contact information: [address] [place] [address] [phone]         Coralie Keens, MD Follow up on 05/09/2021.   Specialty: General Surgery Why: 940am. Please arrive 30 minutes prior to your appointment for paperwork. Please bring a copy of your photo ID and insurance  card. Contact information: [address]  [place] [address] [phone]         Criselda Peaches, MD Follow up.   Specialties: Interventional Radiology, Radiology Why: For your Cholecystostomy Drain Contact information: [address] [place] [address] [phone]         [place] Follow up on 03/29/2021.   Specialty: Cardiology Why: 3:30 PM Contact information: [address] [id number] [place] [address] [phone]        Almyra Deforest, [place] Follow up on 04/18/2021.   Specialties: Cardiology, Radiology Why: 3:35 PM Contact information: [address] [place] [address] [phone]                  The results of significant diagnostics from this hospitalization (including imaging, microbiology, ancillary and laboratory) are listed below for reference.    Significant Diagnostic Studies: DG Chest 1 View  Result Date: 03/15/2021 CLINICAL DATA:  Abdominal pain and vomiting. Clinical concern for aspiration. EXAM: CHEST  1 VIEW COMPARISON:  06/24/2020 FINDINGS: Normal sized heart. Interval minimal patchy density at the right lung base. The left lung remains clear. Aortic valve stent.  Thoracic spine degenerative changes. Left axillary surgical clip. IMPRESSION: Minimal right basilar atelectasis, pneumonia or aspiration pneumonitis. Electronically Signed   By: Claudie Revering M.D.   On: 03/15/2021 14:07   CT ABDOMEN PELVIS W CONTRAST  Result Date: 03/17/2021 CLINICAL DATA:  Acute generalized abdominal pain. EXAM: CT ABDOMEN AND PELVIS WITH CONTRAST TECHNIQUE: Multidetector CT imaging of the abdomen and pelvis was performed using the standard protocol following bolus administration of intravenous contrast. RADIATION DOSE REDUCTION: This exam was performed according to the departmental dose-optimization program which includes automated exposure control, adjustment of the  mA and/or kV according to patient size and/or use of iterative reconstruction technique. CONTRAST:  40mL OMNIPAQUE IOHEXOL 300 MG/ML  SOLN COMPARISON:  March 15, 2021. FINDINGS: Lower chest: Right lower lobe pneumonia or atelectasis is noted. Hepatobiliary: Interval placement of percutaneous cholecystostomy tube is noted. Continued gallbladder distention and cholelithiasis is noted. No biliary dilatation is noted. The liver is unremarkable. Pancreas: Unremarkable. No pancreatic ductal dilatation or surrounding inflammatory changes. Spleen: Normal in size without focal abnormality. Adrenals/Urinary Tract: Stable 2.6 cm left adrenal nodule is noted. Stable right adrenal gland enlargement is noted. Mild bilateral renal atrophy is noted. No nephrolithiasis is noted. Moderate right hydroureteronephrosis is noted without obstructing calculus. Stable dilatation of left renal pelvis is noted without calyceal dilatation. Mild left ureteral dilatation is noted. No obstructing calculus is noted. Mild urinary bladder distention is noted. Stomach/Bowel: Stomach is within normal limits. Appendix appears normal. No evidence of bowel wall thickening, distention, or inflammatory changes. Vascular/Lymphatic: Aortic atherosclerosis. No enlarged abdominal or pelvic lymph nodes. Reproductive: Status post prostatic brachytherapy seed placement. Other: No abdominal wall hernia or abnormality. No abdominopelvic ascites. Musculoskeletal: No acute or significant osseous findings. IMPRESSION: Interval development of right lower lobe opacity is noted concerning for pneumonia or atelectasis. Interval placement of percutaneous cholecystostomy tube in grossly good position. Stable gallbladder distension and cholelithiasis is noted. Grossly stable moderate right hydroureteronephrosis is noted as well as prominent left renal pelvis and mild distal left ureteral dilatation, without evidence of obstructing calculus. Mild urinary bladder distention  is noted. 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 Aortic Atherosclerosis (ICD10-I70.0). Electronically Signed   By: Marijo Conception M.D.   On: 03/17/2021 09:41   US Abdomen Limited  Result Date: 03/15/2021 CLINICAL DATA:  Right upper quadrant pain EXAM: ULTRASOUND ABDOMEN LIMITED RIGHT UPPER QUADRANT COMPARISON:  None. FINDINGS: Gallbladder: Distended with sludge and calculi measuring up to 4 mm. No wall thickening visualized. No pericholecystic fluid. No sonographic Murphy sign noted by sonographer. Common bile duct: Diameter: 3 mm, normal Liver: Left lobe of liver is poorly visualized due to bowel gas. No focal lesion identified. Within normal limits in parenchymal echogenicity. Portal vein is patent on color Doppler imaging with normal direction of blood flow towards the liver. Other: None. IMPRESSION: Distended gallbladder with sludge and calculi but no additional sonographic evidence of acute cholecystitis. Electronically Signed   By: Macy Mis M.D.   On: 03/15/2021 11:40   IR Perc Cholecystostomy  Result Date: 03/16/2021 INDICATION: 81 year old male with right upper quadrant pain and imaging findings concerning for acute calculus cholecystitis. A nuclear medicine HIDA scan was ordered to confirm, however patient's clinical status has deteriorated overnight with significant increase in leukocytosis, new oxygen requirement and increasing pain. Therefore, we are proceeding directly with percutaneous transhepatic cholecystostomy tube placement. EXAM: CHOLECYSTOSTOMY MEDICATIONS: In patient currently on intravenous Zosyn. No additional antibiotic prophylaxis administered. ANESTHESIA/SEDATION: Moderate (conscious) sedation was employed  during this procedure. A total of Versed 0.5 mg and Fentanyl 25 mcg and 0.5 mg Dilaudid was administered intravenously. Moderate Sedation Time: 10 minutes. The patient's level of consciousness and vital signs were  monitored continuously by radiology nursing throughout the procedure under my direct supervision. FLUOROSCOPY TIME:  Fluoroscopy Time: 0 minutes 36 seconds (2 mGy). COMPLICATIONS: None immediate. PROCEDURE: Informed written consent was obtained from the patient after a thorough discussion of the procedural risks, benefits and alternatives. All questions were addressed. Maximal Sterile Barrier Technique was utilized including caps, mask, sterile gowns, sterile gloves, sterile drape, hand hygiene and skin antiseptic. A timeout was performed prior to the initiation of the procedure. The right upper quadrant was interrogated with ultrasound. The gallbladder is markedly distended. A suitable skin entry site was selected and marked. The overlying skin was sterilely prepped and draped in the standard fashion using chlorhexidine skin prep. Local anesthesia was attained by infiltration with 1% lidocaine. A small dermatotomy was made. Under real-time ultrasound guidance, a 21 gauge Accustick needle was advanced along a short transhepatic course and into the gallbladder lumen. Images were obtained and stored for the medical record. A 0.018 wire was advanced in the gallbladder lumen. The needle was exchanged for the Accustick transitional dilator which was advanced over the wire and into the gallbladder. Aspiration yields dark black bile. A sample was sent for Gram stain and culture. A 0.035 wire was coiled in the gallbladder lumen. The skin tract was dilated to 10 [place]. A Cook 10.2 [place] all-purpose drainage catheter was advanced over the wire and formed in the gallbladder. The catheter was connected to gravity bag drainage and secured to the skin with 0 Prolene suture. Overall, the patient tolerated the procedure well. IMPRESSION: Successful placement of 10 French percutaneous transhepatic cholecystostomy tube for the indication of acute calculus cholecystitis. Electronically Signed   By: Jacqulynn Cadet M.D.   On:  03/16/2021 14:07   DG CHEST PORT 1 VIEW  Result Date: 03/17/2021 CLINICAL DATA:  Chest abdominal pain.  Aspiration in airway. EXAM: PORTABLE CHEST 1 VIEW COMPARISON:  AP chest 03/16/2021 FINDINGS: The patient is mildly rightward rotated. Within this limitation, cardiac silhouette and mediastinal contours are grossly unchanged and within normal limits. Calcification is seen within the aortic arch. 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 The lungs appear clear. No pleural effusion or pneumothorax. No acute skeletal abnormality. IMPRESSION: No acute lung process. Electronically Signed   By: Yvonne Kendall M.D.   On: 03/17/2021 09:21   DG CHEST PORT 1 VIEW  Result Date: 03/16/2021 CLINICAL DATA:  Hypoxia EXAM: PORTABLE CHEST 1 VIEW COMPARISON:  Chest x-ray dated March 15, 2021 FINDINGS: Cardiac and mediastinal contours are unchanged within normal limits. Prior transcatheter aortic valve replacement. Interval resolution of right basilar lung opacity, favor resolved atelectasis. No focal consolidation. No large pleural effusion pneumothorax. IMPRESSION: Interval resolution of right basilar lung opacity, favor resolved atelectasis. Electronically Signed   By: Yetta Glassman M.D.   On: 03/16/2021 07:46   CT Renal Stone Study  Result Date: 03/15/2021 CLINICAL DATA:  Right abdominal pain. EXAM: CT ABDOMEN AND PELVIS WITHOUT CONTRAST TECHNIQUE: Multidetector CT imaging of the abdomen and pelvis was performed following the standard protocol without IV contrast. RADIATION DOSE REDUCTION: This exam was performed according to the departmental dose-optimization program which includes automated exposure control, adjustment of the mA and/or kV according to patient size and/or use of iterative reconstruction technique. COMPARISON:  CTA abdomen and pelvis 12/01/2019 FINDINGS: Lower chest: Trace  right pleural fluid is new from prior. Unchanged mild bibasilar bronchiectasis. Mild dependent right lower lobe ground-glass  likely subsegmental atelectasis. A partially visualized ascending aortic endograft. Dense coronary artery calcifications are seen. No pericardial effusion. Lack of intra-articular fluid limits evaluation of the abdominal and pelvic organ parenchyma. The following findings are made within this limitation. Hepatobiliary: Smooth liver contours. No gross liver lesion is seen. The gallbladder is again moderately distended with numerous layering small gallstones, similar to prior. No definite gallbladder wall thickening. Pancreas: Unremarkable. No pancreatic ductal dilatation or surrounding inflammatory changes. Spleen: Normal in size without focal abnormality. Adrenals/Urinary Tract: Left adrenal 2.6 cm nodule is unchanged from 11/26/2019 with internal density of 15 Hounsfield units. Right adrenal 15 mm nodule is unchanged in size with internal density of 5 Hounsfield units suggesting a benign lipid rich adenoma. These both again are likely benign. Mild left-greater-than-right renal pelvis vascular calcifications. Mild bilateral renal cortical thinning is again seen. Bilateral moderate extrarenal pelves are again noted. There is again mild-to-moderate dilatation of the bilateral ureters diffusely down to the level of the ureteropelvic junction likely chronic. No obstructing renal stone is seen. No hydronephrosis. Within the limitations of lack of IV contrast, no contour deforming renal mass. The urinary bladder is grossly unremarkable. Stomach/Bowel: No bowel wall thickening. The terminal ileum is unremarkable. The appendix is within normal limits. No dilated loops of bowel to indicate bowel obstruction. Vascular/Lymphatic: No abdominal aortic aneurysm. Dense vascular calcifications. No enlarged abdominal or pelvic lymph nodes. 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 Other: Small fat containing umbilical hernia, unchanged. Unchanged small fat containing left inguinal  hernia. No free air or free fluid. Musculoskeletal: Mild multilevel degenerative disc changes. Old healed lateral right eleventh rib fracture unchanged. IMPRESSION: 1. No nephroureterolithiasis or hydronephrosis. 2. Cholelithiasis. 3. 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 4. Small fat containing umbilical and left inguinal hernias. 5. Trace right pleural effusion with mild subsegmental atelectasis. 6.  Aortic Atherosclerosis (ICD10-I70.0). Electronically Signed   By: Yvonne Kendall   On: 03/15/2021 11:14   ECHOCARDIOGRAM LIMITED  Result Date: 03/17/2021    ECHOCARDIOGRAM LIMITED REPORT   Patient Name:   CORTLANDT CAPUANO Date of Exam: 03/17/2021 Medical Rec #:  [id number]       Height:       65.0 in Accession #:    [id number]      Weight:       143.3 lb Date of Birth:  [birth year]       BSA:          1.717 m Patient Age:    81 years        BP:           116/50 mmHg Patient Gender: M               HR:           114 bpm. Exam Location:  Inpatient Procedure: Cardiac Doppler, Color Doppler and Limited Echo                         STAT ECHO Reported to: Dr Gwyndolyn Kaufman on 03/17/2021 5:50:00 PM. Indications:    Hypotension  History:        Patient has no prior history of Echocardiogram examinations,                 most recent 01/05/2021.  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  Sonographer:    Clayton Lefort RDCS (AE) Referring Phys: [id number] Darreld Mclean  Sonographer Comments: Image acquisition challenging due to respiratory motion. IMPRESSIONS  1. Left ventricular ejection fraction, by estimation, is 60 to 65%. The left ventricle has normal function. There is moderate concentric left ventricular hypertrophy.  2. Right ventricular systolic function is normal. The right ventricular size is normal.  3. The mitral valve is degenerative. Severe mitral annular calcification.  4. The aortic valve has been  repaired/replaced. 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 Echo findings are consistent with normal structure and function of the aortic valve prosthesis. Aortic valve mean gradient measures 5.0 mmHg. Aortic valve Vmax measures 1.40 m/s. DI 0.8. There is no paravalvular leak.  5. The inferior vena cava is normal in size with <50% respiratory variability, suggesting right atrial pressure of 8 mmHg. Comparison(s): Compared to prior TTE on 01/2021, there is no significant change. TAVR valve continues to be well seated with current gradient 77mmHg (previously 43mmHg). FINDINGS  Left Ventricle: Left ventricular ejection fraction, by estimation, is 60 to 65%. The left ventricle has normal function. The left ventricular internal cavity size was normal in size. There is moderate concentric left ventricular hypertrophy. Right Ventricle: The right ventricular size is normal. Right ventricular systolic function is normal. Pericardium: There is no evidence of pericardial effusion. Mitral Valve: The mitral valve is degenerative in appearance. There is moderate thickening of the mitral valve leaflet(s). There is moderate calcification of the mitral valve leaflet(s). Severe mitral annular calcification. Tricuspid Valve: The tricuspid valve is normal in structure. Tricuspid valve regurgitation is trivial. Aortic Valve: DI 0.8. No PVL. The aortic valve has been repaired/replaced. Aortic valve mean gradient measures 5.0 mmHg. Aortic valve peak gradient measures 7.8 mmHg. Aortic valve area, by VTI measures 2.67 cm. 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 Echo findings are consistent with normal structure and function of the aortic valve prosthesis. Pulmonic Valve: The pulmonic valve was not well visualized. Pulmonic valve regurgitation is trivial. Aorta: The aortic root and ascending aorta are structurally normal, with no evidence of dilitation.  Venous: The inferior vena cava is normal in size with less than 50% respiratory variability, suggesting right atrial pressure of 8 mmHg. LEFT VENTRICLE PLAX 2D LVIDd:         3.90 cm LVIDs:         3.00 cm LV PW:         1.30 cm LV IVS:        1.20 cm LVOT diam:     2.00 cm LV SV:         61 LV SV Index:   36 LVOT Area:     3.14 cm  LEFT ATRIUM           Index LA diam:      3.40 cm 1.98 cm/m LA Vol (A4C): 20.0 ml 11.65 ml/m  AORTIC VALVE AV Area (Vmax):    2.51 cm AV Area (Vmean):   2.53 cm AV Area (VTI):     2.67 cm AV Vmax:           140.00 cm/s AV Vmean:          104.000 cm/s AV VTI:  0.228 m AV Peak Grad:      7.8 mmHg AV Mean Grad:      5.0 mmHg LVOT Vmax:         112.00 cm/s LVOT Vmean:        83.900 cm/s LVOT VTI:          0.194 m LVOT/AV VTI ratio: 0.85  AORTA Ao Root diam: 2.50 cm Ao Asc diam:  2.60 cm  SHUNTS Systemic VTI:  0.19 m Systemic Diam: 2.00 cm Gwyndolyn Kaufman MD Electronically signed by Gwyndolyn Kaufman MD Signature Date/Time: 03/17/2021/6:28:04 PM    Final    [place] EKG SITE RITE  Result Date: 03/19/2021 If Site Rite image not attached, placement could not be confirmed due to current cardiac rhythm.   Microbiology: Recent Results (from the past 240 hour(s))  Resp Panel by RT-PCR (Flu A&B, Covid) Nasopharyngeal Swab     Status: None   Collection Time: 03/15/21  1:35 PM   Specimen: Nasopharyngeal Swab; Nasopharyngeal(NP) swabs in vial transport medium  Result Value Ref Range Status   SARS Coronavirus 2 by RT PCR NEGATIVE NEGATIVE Final    Comment: (NOTE) SARS-CoV-2 target nucleic acids are NOT DETECTED.  The SARS-CoV-2 RNA is generally detectable in upper respiratory specimens during the acute phase of infection. The lowest concentration of SARS-CoV-2 viral copies this assay can detect is 138 copies/mL. A negative result does not preclude SARS-Cov-2 infection and should not be used as the sole basis for treatment or other patient management decisions. A negative  result may occur with  improper specimen collection/handling, submission of specimen other than nasopharyngeal swab, presence of viral mutation(s) within the areas targeted by this assay, and inadequate number of viral copies(<138 copies/mL). A negative result must be combined with clinical observations, patient history, and epidemiological information. The expected result is Negative.  Fact Sheet for Patients:  EntrepreneurPulse.com.au  Fact Sheet for Healthcare Providers:  IncredibleEmployment.be  This test is no t yet approved or cleared by the [place] FDA and  has been authorized for detection and/or diagnosis of SARS-CoV-2 by FDA under an Emergency Use Authorization (EUA). This EUA will remain  in effect (meaning this test can be used) for the duration of the COVID-19 declaration under Section 564(b)(1) of the Act, 21 U.S.C.section 360bbb-3(b)(1), unless the authorization is terminated  or revoked sooner.       Influenza A by PCR NEGATIVE NEGATIVE Final   Influenza B by PCR NEGATIVE NEGATIVE Final    Comment: (NOTE) The Xpert Xpress SARS-CoV-2/FLU/RSV plus assay is intended as an aid in the diagnosis of influenza from Nasopharyngeal swab specimens and should not be used as a sole basis for treatment. Nasal washings and aspirates are unacceptable for Xpert Xpress SARS-CoV-2/FLU/RSV testing.  Fact Sheet for Patients: EntrepreneurPulse.com.au  Fact Sheet for Healthcare Providers: IncredibleEmployment.be  This test is not yet approved or cleared by the [place] FDA and has been authorized for detection and/or diagnosis of SARS-CoV-2 by FDA under an Emergency Use Authorization (EUA). This EUA will remain in effect (meaning this test can be used) for the duration of the COVID-19 declaration under Section 564(b)(1) of the Act, 21 U.S.C. section 360bbb-3(b)(1), unless the authorization is  terminated or revoked.  Performed at [facility], [place] [address] [place], [place] [address]   Body fluid culture w Gram Stain     Status: None   Collection Time: 03/16/21  8:16 AM   Specimen: BILE; Body Fluid  Result Value Ref Range Status  Specimen Description BILE  Final   Special Requests BILE  Final   Gram Stain NO WBC SEEN NO ORGANISMS SEEN   Final   Culture   Final    NO GROWTH 3 DAYS Performed at [facility], [address] [place], [place] [address]    Report Status 03/19/2021 FINAL  Final  Culture, blood (routine x 2)     Status: None   Collection Time: 03/17/21  3:07 AM   Specimen: BLOOD RIGHT HAND  Result Value Ref Range Status   Specimen Description BLOOD RIGHT HAND  Final   Special Requests AEROBIC BOTTLE ONLY Blood Culture adequate volume  Final   Culture   Final    NO GROWTH 5 DAYS Performed at [facility], [place] [address], [place], [place] [address]    Report Status 03/22/2021 FINAL  Final  Culture, blood (routine x 2)     Status: None   Collection Time: 03/17/21  3:17 AM   Specimen: BLOOD RIGHT HAND  Result Value Ref Range Status   Specimen Description BLOOD RIGHT HAND  Final   Special Requests   Final    AEROBIC BOTTLE ONLY Blood Culture results may not be optimal due to an inadequate volume of blood received in culture bottles   Culture   Final    NO GROWTH 5 DAYS Performed at [facility], [place] [address] [place], [place] [address]    Report Status 03/22/2021 FINAL  Final  Urine Culture     Status: None   Collection Time: 03/18/21 10:30 AM   Specimen: In/Out Cath Urine  Result Value Ref Range Status   Specimen Description IN/OUT CATH URINE  Final   Special Requests NONE  Final   Culture   Final    NO GROWTH Performed at [facility], [place] [address], [place],  [address]    Report Status 03/19/2021 FINAL  Final  Resp Panel by RT-PCR (Flu A&B, Covid) Nasopharyngeal Swab     Status: None   Collection  Time: 03/22/21 12:42 PM   Specimen: Nasopharyngeal Swab; Nasopharyngeal(NP) swabs in vial transport medium  Result Value Ref Range Status   SARS Coronavirus 2 by RT PCR NEGATIVE NEGATIVE Final    Comment: (NOTE) SARS-CoV-2 target nucleic acids are NOT DETECTED.  The SARS-CoV-2 RNA is generally detectable in upper respiratory specimens during the acute phase of infection. The lowest concentration of SARS-CoV-2 viral copies this assay can detect is 138 copies/mL. A negative result does not preclude SARS-Cov-2 infection and should not be used as the sole basis for treatment or other patient management decisions. A negative result may occur with  improper specimen collection/handling, submission of specimen other than nasopharyngeal swab, presence of viral mutation(s) within the areas targeted by this assay, and inadequate number of viral copies(<138 copies/mL). A negative result must be combined with clinical observations, patient history, and epidemiological information. The expected result is Negative.  Fact Sheet for Patients:  EntrepreneurPulse.com.au  Fact Sheet for Healthcare Providers:  IncredibleEmployment.be  This test is no t yet approved or cleared by the [place] FDA and  has been authorized for detection and/or diagnosis of SARS-CoV-2 by FDA under an Emergency Use Authorization (EUA). This EUA will remain  in effect (meaning this test can be used) for the duration of the COVID-19 declaration under Section 564(b)(1) of the Act, 21 U.S.C.section 360bbb-3(b)(1), unless the authorization is terminated  or revoked sooner.       Influenza  A by PCR NEGATIVE NEGATIVE Final   Influenza B by PCR NEGATIVE NEGATIVE Final    Comment: (NOTE) The Xpert Xpress SARS-CoV-2/FLU/RSV plus assay is intended as an aid in the diagnosis of influenza from Nasopharyngeal swab specimens and should not be used as a sole basis for treatment. Nasal washings  and aspirates are unacceptable for Xpert Xpress SARS-CoV-2/FLU/RSV testing.  Fact Sheet for Patients: EntrepreneurPulse.com.au  Fact Sheet for Healthcare Providers: IncredibleEmployment.be  This test is not yet approved or cleared by the [place] FDA and has been authorized for detection and/or diagnosis of SARS-CoV-2 by FDA under an Emergency Use Authorization (EUA). This EUA will remain in effect (meaning this test can be used) for the duration of the COVID-19 declaration under Section 564(b)(1) of the Act, 21 U.S.C. section 360bbb-3(b)(1), unless the authorization is terminated or revoked.  Performed at [facility], [place] [address] [place], [place] [address]      Labs: Basic Metabolic Panel: Recent Labs  Lab 03/16/21 2200 03/17/21 0328 03/17/21 1701 03/18/21 0325 03/20/21 0600 03/21/21 0840 03/22/21 0639  NA 139   < > 137 138 135 136 136  K 6.1*   < > 5.6* 5.5* 3.2* 3.3* 3.7  CL 88*   < > 93* 95* 96* 98 99  CO2 28   < > 23 21* 24 25 24   GLUCOSE 170*   < > 130* 118* 163* 133* 116*  BUN 59*   < > 73* 76* 45* 36* 53*  CREATININE 9.69*   < > 10.14* 10.40* 6.44* 4.34* 5.46*  CALCIUM 9.2   < > 8.5* 9.0 8.6* 8.3* 8.6*  PHOS 6.6*  --  6.7*  --  4.0 2.5 3.3   < > = values in this interval not displayed.   Liver Function Tests: Recent Labs  Lab 03/17/21 0328 03/17/21 1701 03/18/21 0325 03/20/21 0600 03/21/21 0840 03/22/21 0639  AST 57*  --  30  --   --   --   ALT 15  --  7  --   --   --   ALKPHOS 106  --  79  --   --   --   BILITOT 1.4*  --  1.0  --   --   --   PROT 7.1  --  6.3*  --   --   --   ALBUMIN 2.4* 2.0* 2.0* 1.7* 1.6* 1.7*   No results for input(s): LIPASE, AMYLASE in the last 168 hours. No results for input(s): AMMONIA in the last 168 hours. CBC: Recent Labs  Lab 03/17/21 0328 03/17/21 1701 03/19/21 0255 03/20/21 0600 03/21/21 0840 03/22/21 0639 03/23/21 0722  WBC 44.0*   42.7*   < > 18.2*  12.1* 13.2* 13.3* 11.3*  NEUTROABS 40.8*  --   --   --   --   --   --   HGB 13.6   13.2   < > 10.9* 8.5* 9.4* 8.9* 7.9*  HCT 39.7   40.3   < > 32.9* 25.4* 27.7* 27.1* 23.8*  MCV 105.0*   105.5*   < > 103.8* 102.8* 103.4* 103.8* 101.7*  PLT 455*   442*   < > 321 277 288 324 350   < > = values in this interval not displayed.   Cardiac Enzymes: No results for input(s): CKTOTAL, CKMB, CKMBINDEX, TROPONINI in the last 168 hours. BNP: BNP (last 3 results) No results for input(s): BNP in the last 8760 hours.  ProBNP (last 3  results) No results for input(s): PROBNP in the last 8760 hours.  CBG: Recent Labs  Lab 03/22/21 0737 03/22/21 1228 03/22/21 2146 03/23/21 0324 03/23/21 0736  GLUCAP 102* 189* 97 104* 109*       Signed:  Florencia Reasons MD, PhD, FACP  [facility] 03/23/2021, 9:46 AM

## 2021-03-23 NOTE — Progress Notes (Signed)
Contacted by CSW that pt may d/c today. Contacted [facility] and spoke to [place] to make clinic aware of pt's possible d/c later today. Clinic aware that if pt does d/c today, he will resume tomorrow.   Melven Sartorius Renal Navigator [phone]

## 2021-03-24 LAB — CBC
HCT: 25.9 % — ABNORMAL LOW (ref 39.0–52.0)
Hemoglobin: 9.1 g/dL — ABNORMAL LOW (ref 13.0–17.0)
MCH: 35.5 pg — ABNORMAL HIGH (ref 26.0–34.0)
MCHC: 35.1 g/dL (ref 30.0–36.0)
MCV: 101.2 fL — ABNORMAL HIGH (ref 80.0–100.0)
Platelets: 411 10*3/uL — ABNORMAL HIGH (ref 150–400)
RBC: 2.56 MIL/uL — ABNORMAL LOW (ref 4.22–5.81)
RDW: 14.6 % (ref 11.5–15.5)
WBC: 12 10*3/uL — ABNORMAL HIGH (ref 4.0–10.5)
nRBC: 0 % (ref 0.0–0.2)

## 2021-03-24 LAB — IRON AND TIBC
Iron: 17 ug/dL — ABNORMAL LOW (ref 45–182)
Saturation Ratios: 17 % — ABNORMAL LOW (ref 17.9–39.5)
TIBC: 101 ug/dL — ABNORMAL LOW (ref 250–450)
UIBC: 84 ug/dL

## 2021-03-24 LAB — GLUCOSE, CAPILLARY
Glucose-Capillary: 100 mg/dL — ABNORMAL HIGH (ref 70–99)
Glucose-Capillary: 114 mg/dL — ABNORMAL HIGH (ref 70–99)
Glucose-Capillary: 125 mg/dL — ABNORMAL HIGH (ref 70–99)
Glucose-Capillary: 129 mg/dL — ABNORMAL HIGH (ref 70–99)
Glucose-Capillary: 89 mg/dL (ref 70–99)
Glucose-Capillary: 95 mg/dL (ref 70–99)

## 2021-03-24 LAB — VITAMIN B12: Vitamin B-12: 1453 pg/mL — ABNORMAL HIGH (ref 180–914)

## 2021-03-24 LAB — FERRITIN: Ferritin: 2489 ng/mL — ABNORMAL HIGH (ref 24–336)

## 2021-03-24 MED ORDER — LIDOCAINE-PRILOCAINE 2.5-2.5 % EX CREA: 1.0000 "application " | TOPICAL_CREAM | CUTANEOUS | Status: DC | PRN

## 2021-03-24 MED ORDER — SODIUM CHLORIDE 0.9 % IV SOLN: 100.0000 mL | INTRAVENOUS | Status: DC | PRN

## 2021-03-24 MED ORDER — GUAIFENESIN ER 600 MG PO TB12
600.0000 mg | ORAL_TABLET | Freq: Two times a day (BID) | ORAL | Status: DC
  Administered 2021-03-24: 600 mg via ORAL
  Filled 2021-03-24: qty 1

## 2021-03-24 MED ORDER — LIDOCAINE HCL (PF) 1 % IJ SOLN: 5.0000 mL | INTRAMUSCULAR | Status: DC | PRN

## 2021-03-24 MED ORDER — ALTEPLASE 2 MG IJ SOLR: 2.0000 mg | Freq: Once | INTRAMUSCULAR | Status: DC | PRN

## 2021-03-24 MED ORDER — PENTAFLUOROPROP-TETRAFLUOROETH EX AERO: 1.0000 "application " | INHALATION_SPRAY | CUTANEOUS | Status: DC | PRN

## 2021-03-24 MED ORDER — HEPARIN SODIUM (PORCINE) 1000 UNIT/ML DIALYSIS: 1000.0000 [IU] | INTRAMUSCULAR | Status: DC | PRN

## 2021-03-24 MED ORDER — PANTOPRAZOLE SODIUM 40 MG PO TBEC
40.0000 mg | DELAYED_RELEASE_TABLET | Freq: Every day | ORAL | Status: DC
  Administered 2021-03-24 – 2021-03-26 (×2): 40 mg via ORAL
  Filled 2021-03-24 (×2): qty 1

## 2021-03-24 MED ORDER — SODIUM CHLORIDE 0.9 % IV SOLN: INTRAVENOUS | Status: DC

## 2021-03-24 MED ORDER — BACITRACIN-NEOMYCIN-POLYMYXIN OINTMENT TUBE
TOPICAL_OINTMENT | Freq: Every day | CUTANEOUS | Status: DC
  Filled 2021-03-24: qty 14

## 2021-03-24 MED ORDER — LIDOCAINE-PRILOCAINE 2.5-2.5 % EX CREA
1.0000 "application " | TOPICAL_CREAM | CUTANEOUS | Status: DC | PRN
  Filled 2021-03-24: qty 5

## 2021-03-24 NOTE — Progress Notes (Addendum)
PROGRESS NOTE    Elijah White  UQJ:[id number] DOB: [birth year] DOA: 03/15/2021 PCP: Maryella Shivers, MD    Chief Complaint  Patient presents with   Abdominal Pain    Brief Narrative:  Elijah White is a 81 y.o. male with medical history significant of ESRD on HD MWF, aortic stenosis status post TAVR 2021, nonobstructive CAD, Parkinson's disease, PVD on Plavix, s/p left BKA, HTN, IIDM, HLD,  came with recurrent right upper quadrant abdominal pain, found to have severe sepsis from pyelocystitis/acute cholecystitis, also has new onset of A. fib RVR  Subjective:  He is legally blind, in bed ,not in acute distress, he denies pain, no cough,  no overt external bleeding There is no fever He reports intermittent hiccups , I did not hear any yesterday or today during encounter  Assessment & Plan:   Principal Problem:   Acute cholecystitis Active Problems:   Complicated UTI (urinary tract infection)   Severe sepsis from pyocystis/acute cholecystitis , presents on admission --cultures unrevealing -ID Dr Gale Journey input  appreciated , was on IV abx (zosyn), plan for omnicef renal dosing until 1/25   Acute urinary retention/hydronephrosis/complicated uti -was on IV abx (zosyn)- seen by ID ,change to Po abx omnicef -need to keep foley ( 84fr foley , last placed on 1/20 /2023) -follow up with urology Dr Cain Sieve for foley management/ removal      cholelithiasis/ suspect early acute cholecystitis with clinical Murphy sign positive on admission -He presents with Acute RUQ abdominal pain with leucocytosis --was on IV abx (zosyn)- seen by ID ,change to Po abx omnicef -GS consult/ IR consult for percutaneous drain, drain removed on 1/17 -hgb dropped without overt external bleed, reports intermittent hiccups, repeat ct ab/pel showed distended gallbladder with dependent calculi and additional intermediate attenuation material within gallbladder lumen likely representing  blood/hemorrhage -discussed with IR who do not plan for intervention unless patient develop ab pain/leukocytosis/fever, IR contact info placed on AVS ([phone]) -f/u with general surgery Dr Ninfa Linden 05/09/2021  RLL PNA vs atelectasis -CT scan on 1/13 suggestive of perhaps a Right lower lobe PNA vs atelectasis, -he did vomit initially on admission, there was  a concern of aspiration pneumonia -he is  on abx already- -he is seen by speech, currently on dys2/thin liquid -Repeat CT on 1/19 showed "Small BILATERAL pleural effusions and atelectasis greater on RIGHT" -- continue pulmonary toilet, he is on room air, I did not hear any cough   Macrocytic anemia -Drop off hemoglobin without overt external bleed -Per chart review she is status post colonoscopy in 11/2019 which showed several polyps status post removal and internal hemorrhoids otherwise unremarkable colonoscopy -PRBC transfusion if hemoglobin less than 7 - start folic acid supplement -Cardiology recommend hold Eliquis for now -FOBT+ , [facility] Dr Alessandra Bevels input appreciated, plan for EGD tomorrow      A fib with RVR, new diagnosis this hospitalization -dig x 1 and brief amiodarone , currently only on eliquis, he is back to sinus rhyrhm -cards consult much appreciated, he may not need long term antiarrythmic meds or anticoagulation -Eliquis discontinued per cardiology recommendation due to concern of acute anemia -cards signed off on 1/20 - he is to follow up with cards /afib clinic   PAD s/p left BKA, on plavix prior to admission, this has been on hold since admission   HTN -holding home meds due to hypotension  ESRD on HD MWF -nephrology consulted.   Parkinson's disease -continue Sinemet    Diabetic neuropathy -Continue  gabapentin    Noninsulin-dependent type 2 diabetes/diabetic neuropathy --Appear has been diet controlled, recent A1c 7% -SSI for now  Legally blind  FTT, long term care at SNF, will benefit from  palliative care following at skilled nursing facility    Body mass index is 24.25 kg/m..  .  Unresulted Labs (From admission, onward)     Start     Ordered   03/19/21 0500  CBC  Daily,   R     Question:  Specimen collection method  Answer:  Lab=Lab collect   03/18/21 1242   Unscheduled  Occult blood card to lab, stool  As needed,   R      03/23/21 1002              DVT prophylaxis: scd's for now, concerning for acute anemia   Code Status: DNR Family Communication: Patient, daughter updated over the phone Disposition:   Status is: Inpatient   Dispo: The patient is from: SNF              Anticipated d/c is to: SNF              Anticipated d/c date is: Pending hemoglobin stabilization and clearance by cardiology and IR                Consultants:  General surgery IR ID Cardiology Urology  Procedures:  Gallbladder drain placement and removal  Antimicrobials:   Anti-infectives (From admission, onward)    Start     Dose/Rate Route Frequency Ordered Stop   03/24/21 0000  cefdinir (OMNICEF) 300 MG capsule        300 mg Oral Every M-W-F (1800) 03/23/21 0957 03/29/21 2359   03/22/21 1800  cefdinir (OMNICEF) capsule 300 mg        300 mg Oral Every M-W-F (1800) 03/21/21 1400 03/29/21 2359   03/21/21 1500  cefdinir (OMNICEF) capsule 300 mg        300 mg Oral  Once 03/21/21 1400 03/21/21 1631   03/21/21 1445  cefdinir (OMNICEF) capsule 300 mg  Status:  Discontinued        300 mg Oral Every 12 hours 03/21/21 1354 03/21/21 1400   03/15/21 1400  piperacillin-tazobactam (ZOSYN) IVPB 2.25 g  Status:  Discontinued        2.25 g 100 mL/hr over 30 Minutes Intravenous Every 8 hours 03/15/21 1326 03/21/21 1354   03/15/21 1330  Ampicillin-Sulbactam (UNASYN) 3 g in sodium chloride 0.9 % 100 mL IVPB  Status:  Discontinued        3 g 200 mL/hr over 30 Minutes Intravenous Every evening 03/15/21 1324 03/15/21 1326           Objective: Vitals:   03/24/21 0002 03/24/21  0422 03/24/21 0629 03/24/21 1416  BP: 134/61 111/82 (!) 172/62 (!) 90/43  Pulse: 78 78  78  Resp: 18 18  20   Temp: 98.2 F (36.8 C) 97.6 F (36.4 C)  (!) 97.4 F (36.3 C)  TempSrc: Oral Oral  Oral  SpO2: 100% 100%  100%  Weight:      Height:        Intake/Output Summary (Last 24 hours) at 03/24/2021 1514 Last data filed at 03/24/2021 0800 Gross per 24 hour  Intake 600 ml  Output 150 ml  Net 450 ml   Filed Weights   03/22/21 1509 03/22/21 1819 03/23/21 0311  Weight: 65.8 kg 65.8 kg 66.1 kg    Examination:  General exam: Chronically ill-appearing ,  legally blind , oriented x3 , NAD Respiratory system: diminished at basis, no rales, no rhonchi, no wheezing. Respiratory effort normal. Cardiovascular system:  RRR.  Gastrointestinal system: Abdomen is nondistended, soft and nontender.  Normal bowel sounds heard. Central nervous system: Alert and orientedx3. No focal neurological deficits. Extremities: Left BKA, right lower extremity in heel protector, able to lift against gravity Skin: No rashes, lesions or ulcers Psychiatry: Judgement and insight appear normal. Mood & affect appropriate.     Data Reviewed: I have personally reviewed following labs and imaging studies  CBC: Recent Labs  Lab 03/20/21 0600 03/21/21 0840 03/22/21 0639 03/23/21 0722 03/24/21 0630  WBC 12.1* 13.2* 13.3* 11.3* 12.0*  HGB 8.5* 9.4* 8.9* 7.9* 9.1*  HCT 25.4* 27.7* 27.1* 23.8* 25.9*  MCV 102.8* 103.4* 103.8* 101.7* 101.2*  PLT 277 288 324 350 411*    Basic Metabolic Panel: Recent Labs  Lab 03/17/21 1701 03/18/21 0325 03/20/21 0600 03/21/21 0840 03/22/21 0639  NA 137 138 135 136 136  K 5.6* 5.5* 3.2* 3.3* 3.7  CL 93* 95* 96* 98 99  CO2 23 21* 24 25 24   GLUCOSE 130* 118* 163* 133* 116*  BUN 73* 76* 45* 36* 53*  CREATININE 10.14* 10.40* 6.44* 4.34* 5.46*  CALCIUM 8.5* 9.0 8.6* 8.3* 8.6*  PHOS 6.7*  --  4.0 2.5 3.3    GFR: Estimated Creatinine Clearance: 9.1 mL/min (A) (by C-G  formula based on SCr of 5.46 mg/dL (H)).  Liver Function Tests: Recent Labs  Lab 03/17/21 1701 03/18/21 0325 03/20/21 0600 03/21/21 0840 03/22/21 0639  AST  --  30  --   --   --   ALT  --  7  --   --   --   ALKPHOS  --  79  --   --   --   BILITOT  --  1.0  --   --   --   PROT  --  6.3*  --   --   --   ALBUMIN 2.0* 2.0* 1.7* 1.6* 1.7*    CBG: Recent Labs  Lab 03/23/21 1958 03/23/21 2357 03/24/21 0418 03/24/21 0749 03/24/21 1121  GLUCAP 130* 125* 89 100* 129*     Recent Results (from the past 240 hour(s))  Resp Panel by RT-PCR (Flu A&B, Covid) Nasopharyngeal Swab     Status: None   Collection Time: 03/15/21  1:35 PM   Specimen: Nasopharyngeal Swab; Nasopharyngeal(NP) swabs in vial transport medium  Result Value Ref Range Status   SARS Coronavirus 2 by RT PCR NEGATIVE NEGATIVE Final    Comment: (NOTE) SARS-CoV-2 target nucleic acids are NOT DETECTED.  The SARS-CoV-2 RNA is generally detectable in upper respiratory specimens during the acute phase of infection. The lowest concentration of SARS-CoV-2 viral copies this assay can detect is 138 copies/mL. A negative result does not preclude SARS-Cov-2 infection and should not be used as the sole basis for treatment or other patient management decisions. A negative result may occur with  improper specimen collection/handling, submission of specimen other than nasopharyngeal swab, presence of viral mutation(s) within the areas targeted by this assay, and inadequate number of viral copies(<138 copies/mL). A negative result must be combined with clinical observations, patient history, and epidemiological information. The expected result is Negative.  Fact Sheet for Patients:  EntrepreneurPulse.com.au  Fact Sheet for Healthcare Providers:  IncredibleEmployment.be  This test is no t yet approved or cleared by the [place] FDA and  has been authorized for detection and/or diagnosis  of SARS-CoV-2 by FDA under an Emergency Use Authorization (EUA). This EUA will remain  in effect (meaning this test can be used) for the duration of the COVID-19 declaration under Section 564(b)(1) of the Act, 21 U.S.C.section 360bbb-3(b)(1), unless the authorization is terminated  or revoked sooner.       Influenza A by PCR NEGATIVE NEGATIVE Final   Influenza B by PCR NEGATIVE NEGATIVE Final    Comment: (NOTE) The Xpert Xpress SARS-CoV-2/FLU/RSV plus assay is intended as an aid in the diagnosis of influenza from Nasopharyngeal swab specimens and should not be used as a sole basis for treatment. Nasal washings and aspirates are unacceptable for Xpert Xpress SARS-CoV-2/FLU/RSV testing.  Fact Sheet for Patients: EntrepreneurPulse.com.au  Fact Sheet for Healthcare Providers: IncredibleEmployment.be  This test is not yet approved or cleared by the [place] FDA and has been authorized for detection and/or diagnosis of SARS-CoV-2 by FDA under an Emergency Use Authorization (EUA). This EUA will remain in effect (meaning this test can be used) for the duration of the COVID-19 declaration under Section 564(b)(1) of the Act, 21 U.S.C. section 360bbb-3(b)(1), unless the authorization is terminated or revoked.  Performed at [facility], [place] [address], [place], [place] [address]   Body fluid culture w Gram Stain     Status: None   Collection Time: 03/16/21  8:16 AM   Specimen: BILE; Body Fluid  Result Value Ref Range Status   Specimen Description BILE  Final   Special Requests BILE  Final   Gram Stain NO WBC SEEN NO ORGANISMS SEEN   Final   Culture   Final    NO GROWTH 3 DAYS Performed at [facility], [place] [address], [place], [place] [address]    Report Status 03/19/2021 FINAL  Final  Culture, blood (routine x 2)     Status: None   Collection Time: 03/17/21  3:07 AM   Specimen: BLOOD RIGHT HAND  Result Value Ref Range  Status   Specimen Description BLOOD RIGHT HAND  Final   Special Requests AEROBIC BOTTLE ONLY Blood Culture adequate volume  Final   Culture   Final    NO GROWTH 5 DAYS Performed at [facility], [place] [address] [place], [place] [address]    Report Status 03/22/2021 FINAL  Final  Culture, blood (routine x 2)     Status: None   Collection Time: 03/17/21  3:17 AM   Specimen: BLOOD RIGHT HAND  Result Value Ref Range Status   Specimen Description BLOOD RIGHT HAND  Final   Special Requests   Final    AEROBIC BOTTLE ONLY Blood Culture results may not be optimal due to an inadequate volume of blood received in culture bottles   Culture   Final    NO GROWTH 5 DAYS Performed at [facility], [place] [address] [place], [place] [address]    Report Status 03/22/2021 FINAL  Final  Urine Culture     Status: None   Collection Time: 03/18/21 10:30 AM   Specimen: In/Out Cath Urine  Result Value Ref Range Status   Specimen Description IN/OUT CATH URINE  Final   Special Requests NONE  Final   Culture   Final    NO GROWTH Performed at [facility], [place] [address], [place], [place] [address]    Report Status 03/19/2021 FINAL  Final  Resp Panel by RT-PCR (Flu A&B, Covid) Nasopharyngeal Swab     Status: None  Collection Time: 03/22/21 12:42 PM   Specimen: Nasopharyngeal Swab; Nasopharyngeal(NP) swabs in vial transport medium  Result Value Ref Range Status   SARS Coronavirus 2 by RT PCR NEGATIVE NEGATIVE Final    Comment: (NOTE) SARS-CoV-2 target nucleic acids are NOT DETECTED.  The SARS-CoV-2 RNA is generally detectable in upper respiratory specimens during the acute phase of infection. The lowest concentration of SARS-CoV-2 viral copies this assay can detect is 138 copies/mL. A negative result does not preclude SARS-Cov-2 infection and should not be used as the sole basis for treatment or other patient management decisions. A negative result may occur with  improper specimen  collection/handling, submission of specimen other than nasopharyngeal swab, presence of viral mutation(s) within the areas targeted by this assay, and inadequate number of viral copies(<138 copies/mL). A negative result must be combined with clinical observations, patient history, and epidemiological information. The expected result is Negative.  Fact Sheet for Patients:  EntrepreneurPulse.com.au  Fact Sheet for Healthcare Providers:  IncredibleEmployment.be  This test is no t yet approved or cleared by the [place] FDA and  has been authorized for detection and/or diagnosis of SARS-CoV-2 by FDA under an Emergency Use Authorization (EUA). This EUA will remain  in effect (meaning this test can be used) for the duration of the COVID-19 declaration under Section 564(b)(1) of the Act, 21 U.S.C.section 360bbb-3(b)(1), unless the authorization is terminated  or revoked sooner.       Influenza A by PCR NEGATIVE NEGATIVE Final   Influenza B by PCR NEGATIVE NEGATIVE Final    Comment: (NOTE) The Xpert Xpress SARS-CoV-2/FLU/RSV plus assay is intended as an aid in the diagnosis of influenza from Nasopharyngeal swab specimens and should not be used as a sole basis for treatment. Nasal washings and aspirates are unacceptable for Xpert Xpress SARS-CoV-2/FLU/RSV testing.  Fact Sheet for Patients: EntrepreneurPulse.com.au  Fact Sheet for Healthcare Providers: IncredibleEmployment.be  This test is not yet approved or cleared by the [place] FDA and has been authorized for detection and/or diagnosis of SARS-CoV-2 by FDA under an Emergency Use Authorization (EUA). This EUA will remain in effect (meaning this test can be used) for the duration of the COVID-19 declaration under Section 564(b)(1) of the Act, 21 U.S.C. section 360bbb-3(b)(1), unless the authorization is terminated or revoked.  Performed at [facility], [place] [address] [place], [place] [address]          Radiology Studies: CT ABDOMEN PELVIS WO CONTRAST  Result Date: 03/23/2021 CLINICAL DATA:  Postoperative RIGHT upper quadrant pain, headache ups, fall in hemoglobin, post percutaneous cholecystostomy on 03/16/2021 with removal of biliary drain on 03/21/2021 EXAM: CT ABDOMEN AND PELVIS WITHOUT CONTRAST TECHNIQUE: Multidetector CT imaging of the abdomen and pelvis was performed following the standard protocol without IV contrast. RADIATION DOSE REDUCTION: This exam was performed according to the departmental dose-optimization program which includes automated exposure control, adjustment of the mA and/or kV according to patient size and/or use of iterative reconstruction technique. COMPARISON:  03/17/2021 FINDINGS: Lower chest: Small BILATERAL pleural effusions, RIGHT greater than LEFT. Significant atelectasis of RIGHT lower lobe. Minimal compressive atelectasis posterior LEFT lower lobe. Coronary arterial calcifications noted with evidence of TAVR. Hepatobiliary: Interval removal of cholecystostomy tube. Dependent calculi within gallbladder with additional new intermediate attenuation material within gallbladder lumen likely representing blood. Gallbladder remains distended. Liver unremarkable. Pancreas: Normal appearance Spleen: Normal appearance.  Small splenule adjacent to spleen. Adrenals/Urinary Tract: 2.5 x 2.1 cm diameter homogeneous LEFT adrenal mass, 32 HU attenuation on  noncontrast imaging, probable adrenal adenoma. RIGHT adrenal gland unremarkable. No renal mass or hydronephrosis. Foley catheter decompresses urinary bladder. No ureteral dilatation. Stomach/Bowel: Normal appendix. Stomach and bowel loops normal appearance. Vascular/Lymphatic: Atherosclerotic calcifications aorta and iliac arteries. No adenopathy. Reproductive: Seed implants at prostate gland. Other: Small amount of low-attenuation free fluid in pelvis and  perihepatic. No free air. Small umbilical hernia containing fat. Musculoskeletal: Bones demineralized. IMPRESSION: Interval removal of cholecystostomy tube. Persistent gallbladder distention with dependent calculi and additional intermediate attenuation material within gallbladder lumen likely representing blood/hemorrhage. Small amount of nonspecific low-attenuation free intraperitoneal fluid perihepatic and in pelvis. 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 Small BILATERAL pleural effusions and atelectasis greater on RIGHT. Small umbilical hernia containing fat. Aortic Atherosclerosis (ICD10-I70.0). Electronically Signed   By: Lavonia Dana M.D.   On: 03/23/2021 13:34        Scheduled Meds:  (feeding supplement) PROSource Plus  30 mL Oral BID BM   brimonidine  1 drop Right Eye TID   carbidopa-levodopa  1 tablet Oral TID   cefdinir  300 mg Oral Q M,W,F-1800   Chlorhexidine Gluconate Cloth  6 each Topical Q0600   cinacalcet  60 mg Oral Q M,W,F-HD   darbepoetin (ARANESP) injection - DIALYSIS  40 mcg Intravenous Q Mon-HD   doxercalciferol  2 mcg Intravenous Q M,W,F-HD   folic acid  1 mg Oral Daily   guaiFENesin  600 mg Oral BID   insulin aspart  0-6 Units Subcutaneous Q4H   latanoprost  1 drop Right Eye QHS   pantoprazole  40 mg Oral Daily   pilocarpine  1 drop Right Eye QID   sevelamer carbonate  1,600 mg Oral TID WC   sodium chloride flush  10-40 mL Intracatheter Q12H   Continuous Infusions:  sodium chloride     sodium chloride     sodium chloride     sodium chloride     sodium chloride       LOS: 9 days   Time spent: 65mins Greater than 50% of this time was spent in counseling, explanation of diagnosis, planning of further management, and coordination of care.   Voice Recognition Viviann Spare dictation system was used to create this note, attempts have been made to correct errors. Please contact the author with questions and/or clarifications.   Florencia Reasons, MD PhD FACP [facility]  Available via Epic secure chat 7am-7pm for nonurgent issues Please page for urgent issues To page the attending provider between 7A-7P or the covering provider during after hours 7P-7A, please log into the web site [email or web address] and access using universal [place] password for that web site. If you do not have the password, please call the hospital operator.    03/24/2021, 3:14 PM

## 2021-03-24 NOTE — Care Management Important Message (Signed)
Important Message  Patient Details  Name: Elijah White MRN: [id number] Date of Birth: 05/23/1940   Medicare Important Message Given:  Yes     [name] 03/24/2021, 10:37 AM

## 2021-03-24 NOTE — Progress Notes (Signed)
IR received request today to evaluate patient regarding yesterday's CT findings regarding his gallbladder. IR was contacted yesterday with the same request - please see note from 03/23/21 by Durenda Guthrie.   Patient is currently asymptomatic - without fever, pain or significant leukocytosis. Hemoglobin level is stable. No clinical indication for a percutaneous cholecystostomy at this time. Please contact IR if patient's develops symptoms consistent with acute cholecystitis.   Soyla Dryer, [place] [phone] 03/24/2021, 1:50 PM

## 2021-03-24 NOTE — Progress Notes (Signed)
Mobility Specialist: Progress Note   03/24/21 1057  Mobility  Bed Position Chair  Activity Transferred from bed to chair  Level of Assistance +2 (takes two people)  Assistive Device MaxiMove  Activity Response Tolerated well  $Mobility charge 1 Mobility   Pt assisted to recliner via maximove, no c/o throughout. Pt has call bell and phone in his lap. Chair alarm is on.   [facility] Brittlyn Cloe Mobility Specialist Mobility Specialist 4 [place]: [phone] Mobility Specialist 2 [place]: [phone]

## 2021-03-24 NOTE — Progress Notes (Addendum)
Progress Note  Patient Name: Elijah White Date of Encounter: 03/24/2021  [facility] Cardiologist: Evalina Field, MD   Subjective   Patient denies any chest pain, trouble breathing, dizziness, lightheadedness, palpitations, abdominal pain. Is somewhat frustrated that he was not able to go home yesterday.   Inpatient Medications    Scheduled Meds:  (feeding supplement) PROSource Plus  30 mL Oral BID BM   brimonidine  1 drop Right Eye TID   carbidopa-levodopa  1 tablet Oral TID   cefdinir  300 mg Oral Q M,W,F-1800   Chlorhexidine Gluconate Cloth  6 each Topical Q0600   cinacalcet  60 mg Oral Q M,W,F-HD   darbepoetin (ARANESP) injection - DIALYSIS  40 mcg Intravenous Q Mon-HD   doxercalciferol  2 mcg Intravenous Q M,W,F-HD   folic acid  1 mg Oral Daily   insulin aspart  0-6 Units Subcutaneous Q4H   latanoprost  1 drop Right Eye QHS   pantoprazole (PROTONIX) IV  40 mg Intravenous Q24H   pilocarpine  1 drop Right Eye QID   sevelamer carbonate  1,600 mg Oral TID WC   sodium chloride flush  10-40 mL Intracatheter Q12H   Continuous Infusions:  PRN Meds: acetaminophen, ipratropium-albuterol, ondansetron **OR** ondansetron (ZOFRAN) IV, sodium chloride flush   Vital Signs    Vitals:   03/23/21 1947 03/24/21 0002 03/24/21 0422 03/24/21 0629  BP: 97/68 134/61 111/82 (!) 172/62  Pulse: 81 78 78   Resp: 18 18 18    Temp: 97.9 F (36.6 C) 98.2 F (36.8 C) 97.6 F (36.4 C)   TempSrc: Oral Oral Oral   SpO2: 99% 100% 100%   Weight:      Height:        Intake/Output Summary (Last 24 hours) at 03/24/2021 0743 Last data filed at 03/24/2021 0500 Gross per 24 hour  Intake 360 ml  Output 150 ml  Net 210 ml   Last 3 Weights 03/23/2021 03/22/2021 03/22/2021  Weight (lbs) 145 lb 11.6 oz 145 lb 1 oz 145 lb 1 oz  Weight (kg) 66.1 kg 65.8 kg 65.8 kg      Telemetry    Sinus rhythm, heart rate in the 80s - Personally Reviewed  ECG    No new tracings since 1/13 - Personally  Reviewed  Physical Exam   GEN: No acute distress.   Neck: No JVD Cardiac: RRR, no rubs or gallops. Grade 2/6 systolic murmur at right upper sternal border  Respiratory: Breathing unlabored, anterior lung exam clear to auscultation (Limited because of patient's positioning in bed while eating breakfast) GI: Soft, nontender, non-distended  MS: No edema. Left BKA Neuro:  Nonfocal  Psych: Normal affect   Labs    High Sensitivity Troponin:  No results for input(s): TROPONINIHS in the last 720 hours.   Chemistry Recent Labs  Lab 03/18/21 0325 03/20/21 0600 03/21/21 0840 03/22/21 0639  NA 138 135 136 136  K 5.5* 3.2* 3.3* 3.7  CL 95* 96* 98 99  CO2 21* 24 25 24   GLUCOSE 118* 163* 133* 116*  BUN 76* 45* 36* 53*  CREATININE 10.40* 6.44* 4.34* 5.46*  CALCIUM 9.0 8.6* 8.3* 8.6*  PROT 6.3*  --   --   --   ALBUMIN 2.0* 1.7* 1.6* 1.7*  AST 30  --   --   --   ALT 7  --   --   --   ALKPHOS 79  --   --   --   BILITOT 1.0  --   --   --  GFRNONAA 5* 8* 13* 10*  ANIONGAP 22* 15 13 13     Lipids No results for input(s): CHOL, TRIG, HDL, LABVLDL, LDLCALC, CHOLHDL in the last 168 hours.  Hematology Recent Labs  Lab 03/22/21 0639 03/23/21 0722 03/24/21 0630  WBC 13.3* 11.3* 12.0*  RBC 2.61* 2.34* 2.56*  HGB 8.9* 7.9* 9.1*  HCT 27.1* 23.8* 25.9*  MCV 103.8* 101.7* 101.2*  MCH 34.1* 33.8 35.5*  MCHC 32.8 33.2 35.1  RDW 14.7 14.6 14.6  PLT 324 350 411*   Thyroid  Recent Labs  Lab 03/22/21 1045  TSH 1.311    BNPNo results for input(s): BNP, PROBNP in the last 168 hours.  DDimer No results for input(s): DDIMER in the last 168 hours.   Radiology    CT ABDOMEN PELVIS WO CONTRAST  Result Date: 03/23/2021 CLINICAL DATA:  Postoperative RIGHT upper quadrant pain, headache ups, fall in hemoglobin, post percutaneous cholecystostomy on 03/16/2021 with removal of biliary drain on 03/21/2021 EXAM: CT ABDOMEN AND PELVIS WITHOUT CONTRAST TECHNIQUE: Multidetector CT imaging of the abdomen  and pelvis was performed following the standard protocol without IV contrast. RADIATION DOSE REDUCTION: This exam was performed according to the departmental dose-optimization program which includes automated exposure control, adjustment of the mA and/or kV according to patient size and/or use of iterative reconstruction technique. COMPARISON:  03/17/2021 FINDINGS: Lower chest: Small BILATERAL pleural effusions, RIGHT greater than LEFT. Significant atelectasis of RIGHT lower lobe. Minimal compressive atelectasis posterior LEFT lower lobe. Coronary arterial calcifications noted with evidence of TAVR. Hepatobiliary: Interval removal of cholecystostomy tube. Dependent calculi within gallbladder with additional new intermediate attenuation material within gallbladder lumen likely representing blood. Gallbladder remains distended. Liver unremarkable. Pancreas: Normal appearance Spleen: Normal appearance.  Small splenule adjacent to spleen. Adrenals/Urinary Tract: 2.5 x 2.1 cm diameter homogeneous LEFT adrenal mass, 32 HU attenuation on noncontrast imaging, probable adrenal adenoma. RIGHT adrenal gland unremarkable. No renal mass or hydronephrosis. Foley catheter decompresses urinary bladder. No ureteral dilatation. Stomach/Bowel: Normal appendix. Stomach and bowel loops normal appearance. Vascular/Lymphatic: Atherosclerotic calcifications aorta and iliac arteries. No adenopathy. Reproductive: Seed implants at prostate gland. Other: Small amount of low-attenuation free fluid in pelvis and perihepatic. No free air. Small umbilical hernia containing fat. Musculoskeletal: Bones demineralized. IMPRESSION: Interval removal of cholecystostomy tube. Persistent gallbladder distention with dependent calculi and additional intermediate attenuation material within gallbladder lumen likely representing blood/hemorrhage. Small amount of nonspecific low-attenuation free intraperitoneal fluid perihepatic and in pelvis. 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 Small BILATERAL pleural effusions and atelectasis greater on RIGHT. Small umbilical hernia containing fat. Aortic Atherosclerosis (ICD10-I70.0). Electronically Signed   By: Lavonia Dana M.D.   On: 03/23/2021 13:34    Cardiac Studies   TTE 03/17/2021 1. Left ventricular ejection fraction, by estimation, is 60 to 65%. The  left ventricle has normal function. There is moderate concentric left  ventricular hypertrophy.   2. Right ventricular systolic function is normal. The right ventricular  size is normal.   3. The mitral valve is degenerative. Severe mitral annular calcification.   4. The aortic valve has been repaired/replaced. There is a 26 mm Sapien  prosthetic (TAVR) valve present in the aortic position. Echo findings are  consistent with normal structure and function of the aortic valve  prosthesis. Aortic valve mean gradient  measures 5.0 mmHg. Aortic valve Vmax measures 1.40 m/s. DI 0.8. There is  no paravalvular leak.   5. The inferior vena cava is normal in size with <50% respiratory  variability, suggesting  right atrial pressure of 8 mmHg.   Comparison(s): Compared to prior TTE on 01/2021, there is no significant  change. TAVR valve continues to be well seated with current gradient 56mmHg  (previously 60mmHg).   Patient Profile     80 y.o. male with medical history significant of ESRD on HD MWF, aortic stenosis status post TAVR 2021, nonobstructive CAD, Parkinson's disease, PAD on Plavix, s/p left BKA, HTN, IIDM, HLD,  came with recurrent right upper quadrant abdominal pain.  Thought to have cholecystitis and drain placed by IR.  Stay in hospital complicated by a fib with RVR,  sepsis  2/2 acute cholecystitis ; bld cultures pending s/p perc drain on IV zosyn now in sinus rhythm. Taken off amiodarone on 1/18 and remains in sinus rhythm   Assessment & Plan    New onset atrial fibrillation: in the setting of acute chole. Currently in sinus rhythm -  Chads2vasc 4.  - Rate control was attempted but the patient had significant RVR with hypotension  - Converted to sinus rhythm on 1/14 on amiodarone. As afib occurred in the setting of acute chole/sepsis and he has no history of afib,  he may not require long term amiodarone therapy. Taken off oral amiodarone 1/18. Remains in sinus rhythm, no episodes of afib identified on telemetry.  Since he has dialysis 3 days a week and cardiology follow up, he will be closely monitored for a fib reoccurrence.  - Hemodynamics improved    - Patient was being anticoagulated with eliquis 2.5mg  BID (patient is ESRD). However, hemoglobin had been trending down since initiating (Lowest hemoglobin 7.9 on 1/19) Fecal occult was positive on 1/19, eliquis held. Hemoglobin has improved to 9.1 since stopping anticoagulation. As patient has not had any new episodes of afib and he will be closely monitored outpatient, OK to hold anticoagulation  - TSH normal on 1/18 - Scheduled for follow up appointments with afib clinic, cardiology    Acute cholecystitis, sepsis  - s/p percutaneous chole drain, removed on 1/17 - Managed per general surgery, primary team, ID   PAD: s/p BKA  - Continue home statin  - Plavix held. OK to hold until hemoglobin stable   S/p 26 mm Sapien prosthetic (TAVR)  - Normal mean gradient, well seated on echo 03/17/21    ESRD  - Managed per nephrology       For questions or updates, please contact [facility] Please consult [email or web address] for contact info under        Signed, Margie Billet, PA-C  03/24/2021, 7:43 AM     Patient seen and examined   I agree with findings as noted by Sandria Senter above    Pt still with hiccoups  Lungs are CTA  Cardiac RRR  No s3 Ext are without edema  Remains in SR   Off of amio.   Eliquis was stopped due to drop in Hgb and CT findings and now heme + stool I would follow on tele   Again, only afib was during acute setting of cholecystitis    REsolved  quickly  COntinue telemetry  WIll have f/u I Afib clinic and Cardiology (s/p TAVR)  WIll sign off.  [name] MD

## 2021-03-24 NOTE — Progress Notes (Signed)
Physical Therapy Follow up   Pt Evaluated by Physical Therapy 03/19/21 and was found to be at his baseline level of function requiring lift assist for OOB mobility. MD and pt requested OOB to recliner. PT coordinated with Mobility Specialist to have pt regularly lifted to chair.   Waylan Busta B. Migdalia Dk PT, DPT Acute Rehabilitation Services Pager [phone] Office [phone]

## 2021-03-24 NOTE — Progress Notes (Signed)
Mobility Specialist: Progress Note   03/24/21 1247  Mobility  Activity Transferred from chair to bed  Level of Assistance +2 (takes two people)  Assistive Device MaxiMove  Activity Response Tolerated well  $Mobility charge 1 Mobility   Pt assisted back to bed per request, no c/o throughout. Pt in bed with call bell and phone in his lap.   [facility] Viera [place]ki Mobility Specialist Mobility Specialist [address] [place]: [phone] Mobility Specialist 2 [place]: [phone]

## 2021-03-24 NOTE — Consult Note (Signed)
Referring Provider:  [facility] Primary Care Physician:  Maryella Shivers, MD Primary Gastroenterologist:  Dr. [name]  Reason for Consultation: Anemia, recent cholecystectomy  HPI: Elijah White is a 81 y.o. male with past medical history of peripheral vascular disease was on Plavix, history of left BKA, history of aortic stenosis s/p TAVR in 2021, history of end-stage renal disease on dialysis was admitted to the hospital with abdominal pain on March 15, 2021.  Was subsequently found to have sepsis with complicated UTI as well as acute cholecystitis.    Was seen by general and recommended to have percutaneous cholecystostomy tube placement by IR which was done on March 16, 2021.   Also found to have new onset A. Fib and was placed on Eliquis initially which is currently on hold now because of anemia.  Last dose yesterday morning.    Hemoglobin dropped to around 7.9 yesterday.   Hemoccult positive.  Iron studies shows low iron low TIBC which is consistent with anemia of chronic disease.  Hemoglobin is 9.1 today.  GI is consulted for further evaluation.  Patient seen and examined at bedside.  His abdominal pain has improved.  Cholecystostomy drain has been removed.  He is legally blind and not sure of any blood in the stool or black stool.  Colonoscopy in September 2021 by Dr. Therisa Doyne showed multiple tubular adenomas including 1 large 20 mm tubular adenoma at appendiceal orifice.  Repeat colonoscopy was recommended in 1 year.  Past Medical History:  Diagnosis Date   Anemia    low iron   Aortic stenosis    s/p TAVR 01/19/20   Arthritis    Cancer [facility])    prostate, s/p I-125 seed implant 05/18/05   Colon polyps ~ 1993 and 2003   Dr [name], [facility].  08/2001 colonoscopy: tubular adenoma at cecum.     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   Elevated cholesterol with high triglycerides    ESRD (end stage renal disease) (HCC)    [facility] [employer]    GERD (gastroesophageal reflux disease)    Glaucoma    Hypertension    Legally blind in left eye, as defined in [place]    has pinpoint vision in right eye   PAD (peripheral artery disease) ([place])    RBBB     Past Surgical History:  Procedure Laterality Date   ABDOMINAL AORTOGRAM W/LOWER EXTREMITY N/A 07/25/2020   Procedure: ABDOMINAL AORTOGRAM W/LOWER EXTREMITY;  Surgeon: Marty Heck, MD;  Location: [place] CV LAB;  Service: Cardiovascular;  Laterality: N/A;   AMPUTATION Left 08/12/2020   Procedure: LEFT BELOW KNEE AMPUTATION;  Surgeon: Marty Heck, MD;  Location: [place];  Service: Vascular;  Laterality: Left;   AV FISTULA PLACEMENT Left 03/20/2018   Procedure: ARTERIOVENOUS (AV) FISTULA CREATION ARM;  Surgeon: [name], MD;  Location: [place];  Service: Vascular;  Laterality: Left;   [place] Left 05/21/2018   Procedure: LEFT BASILIC VEIN FISTULA SECOND STAGE;  Surgeon: [name], MD;  Location: [place];  Service: Vascular;  Laterality: Left;   COLONOSCOPY     COLONOSCOPY WITH PROPOFOL N/A 11/30/2019   Procedure: COLONOSCOPY WITH PROPOFOL;  Surgeon: Ronnette Juniper, MD;  Location: [place];  Service: Gastroenterology;  Laterality: N/A;   GLAUCOMA SURGERY  2019   multiple surgeries   HEMOSTASIS CLIP PLACEMENT  11/30/2019   Procedure: HEMOSTASIS CLIP PLACEMENT;  Surgeon: Ronnette Juniper, MD;  Location:  [place] ENDOSCOPY;  Service: Gastroenterology;;   IR CHOLANGIOGRAM EXISTING TUBE  03/21/2021   IR PERC CHOLECYSTOSTOMY  03/16/2021   IR REMOVAL BILIARY DRAIN  03/21/2021   KNEE ARTHROSCOPY     MULTIPLE EXTRACTIONS WITH ALVEOLOPLASTY N/A 12/30/2019   Procedure: MULTIPLE EXTRACTION WITH ALVEOLOPLASTY;  Surgeon: Charlaine Dalton, DMD;  Location: [place];  Service: Dentistry;  Laterality: N/A;   PERIPHERAL VASCULAR INTERVENTION Left 07/25/2020   Procedure: PERIPHERAL VASCULAR INTERVENTION;  Surgeon: Marty Heck, MD;  Location: [place]  CV LAB;  Service: Cardiovascular;  Laterality: Left;  superficial femoral   POLYPECTOMY  11/30/2019   Procedure: POLYPECTOMY;  Surgeon: Ronnette Juniper, MD;  Location: [place];  Service: Gastroenterology;;   PROSTATE SURGERY     RIGHT/LEFT HEART CATH AND CORONARY ANGIOGRAPHY N/A 12/03/2019   Procedure: RIGHT/LEFT HEART CATH AND CORONARY ANGIOGRAPHY;  Surgeon: Burnell Blanks, MD;  Location: [place] CV LAB;  Service: Cardiovascular;  Laterality: N/A;   SUBMUCOSAL TATTOO INJECTION  11/30/2019   Procedure: SUBMUCOSAL TATTOO INJECTION;  Surgeon: Ronnette Juniper, MD;  Location: [place];  Service: Gastroenterology;;   TEE WITHOUT CARDIOVERSION N/A 01/19/2020   Procedure: TRANSESOPHAGEAL ECHOCARDIOGRAM (TEE);  Surgeon: Burnell Blanks, MD;  Location: [place] CV LAB;  Service: Open Heart Surgery;  Laterality: N/A;   TRANSCATHETER AORTIC VALVE REPLACEMENT, TRANSFEMORAL Left 01/19/2020   Procedure: TRANSCATHETER AORTIC VALVE REPLACEMENT, LEFT TRANSFEMORAL;  Surgeon: Burnell Blanks, MD;  Location: [place] CV LAB;  Service: Open Heart Surgery;  Laterality: Left;    Prior to Admission medications   Medication Sig Start Date End Date Taking? Authorizing Provider  acetaminophen (TYLENOL) 500 MG tablet Take 1,000 mg by mouth every 6 (six) hours as needed for moderate pain or headache.   Yes [provider]  aspirin EC 81 MG tablet Take 81 mg by mouth in the morning. Swallow whole.   Yes [provider]  atorvastatin (LIPITOR) 10 MG tablet Take 1 tablet (10 mg total) by mouth in the morning. 08/16/20  Yes Rhyne, Samantha J, PA-C  AURYXIA 1 GM 210 MG(Fe) tablet Take 420 mg by mouth 3 (three) times daily with meals. 05/30/20  Yes [provider]  B Complex-C-Folic Acid (DIALYVITE 482) 0.8 MG TABS Take 1 tablet by mouth in the morning. 05/08/19  Yes [provider]  brimonidine (ALPHAGAN) 0.15 % ophthalmic solution Place 1 drop into the right eye 3  (three) times daily.   Yes [provider]  carbidopa-levodopa (SINEMET) 25-100 MG tablet Take 1 tablet by mouth 3 (three) times daily. Patient taking differently: Take 1 tablet by mouth 3 (three) times daily. 1 TABLET @ 8am/ 12pm/ 4pm 10/19/20 03/15/21 Yes Curatolo, Adam, DO  clopidogrel (PLAVIX) 75 MG tablet Take 1 tablet (75 mg total) by mouth daily with breakfast. 07/25/20  Yes Marty Heck, MD  docusate sodium (COLACE) 100 MG capsule Take 100 mg by mouth 2 (two) times daily.   Yes [provider]  dorzolamide-timolol (COSOPT) 22.3-6.8 MG/ML ophthalmic solution Place 1 drop into the right eye 2 (two) times daily. 06/25/17  Yes [provider]  gabapentin (NEURONTIN) 100 MG capsule Take 1 capsule (100 mg total) by mouth daily. 10/19/20 03/15/21 Yes Curatolo, Adam, DO  isosorbide mononitrate (IMDUR) 30 MG 24 hr tablet Take 1 tablet (30 mg total) by mouth in the morning. 08/16/20  Yes Rhyne, Hulen Shouts, PA-C  Melatonin 5 MG CAPS Take 5 mg by mouth at bedtime.   Yes [provider]  pantoprazole (PROTONIX) 40 MG tablet Take 1 tablet (40 mg total) by mouth daily at 12 noon. 08/16/20  Yes Rhyne, Hulen Shouts, PA-C  pilocarpine (PILOCAR) 4 % ophthalmic solution Place 1 drop into the right eye 4 (four) times daily.    Yes [provider]  polyethylene glycol (MIRALAX / GLYCOLAX) 17 g packet Take 17 g by mouth at bedtime.   Yes [provider]  ROCKLATAN 0.02-0.005 % SOLN Place 1 drop into the right eye at bedtime. 06/20/20  Yes [provider]  blood glucose meter kit and supplies KIT Dispense based on patient and insurance preference. Use up to four times daily as directed. (FOR ICD-9 250.00, 250.01). For QAC - HS accuchecks. 03/30/18   Thurnell Lose, MD  carvedilol (COREG) 3.125 MG tablet Take 1 tablet (3.125 mg total) by mouth 2 (two) times daily with a meal. Patient not taking: Reported on 03/15/2021 08/16/20   Gabriel Earing, PA-C   cefdinir (OMNICEF) 300 MG capsule Take 1 capsule (300 mg total) by mouth every Monday, Wednesday, and Friday at 6 PM for 5 days. 03/24/21 03/29/21  Florencia Reasons, MD  Doxercalciferol (HECTOROL IV) Doxercalciferol (Hectorol) 08/09/20 08/28/21  [provider]  Lancets (ONETOUCH DELICA PLUS [id number]) [place]  03/31/18   [provider]  Methoxy PEG-Epoetin Beta (MIRCERA IJ) [place] 09/05/20 09/04/21  [provider]  ONETOUCH VERIO test strip USE AS DIRECTED TO TEST FOUR TIMES A DAY 07/31/18   [name], MD  oxyCODONE-acetaminophen (PERCOCET) 5-325 MG tablet Take 1 tablet by mouth every 6 (six) hours as needed (pain). Patient not taking: Reported on 03/15/2021 08/19/20   Karoline Caldwell, PA-C    Scheduled Meds:  (feeding supplement) PROSource Plus  30 mL Oral BID BM   brimonidine  1 drop Right Eye TID   carbidopa-levodopa  1 tablet Oral TID   cefdinir  300 mg Oral Q M,W,F-1800   Chlorhexidine Gluconate Cloth  6 each Topical Q0600   cinacalcet  60 mg Oral Q M,W,F-HD   darbepoetin (ARANESP) injection - DIALYSIS  40 mcg Intravenous Q Mon-HD   doxercalciferol  2 mcg Intravenous Q M,W,F-HD   folic acid  1 mg Oral Daily   insulin aspart  0-6 Units Subcutaneous Q4H   latanoprost  1 drop Right Eye QHS   pantoprazole (PROTONIX) IV  40 mg Intravenous Q24H   pilocarpine  1 drop Right Eye QID   sevelamer carbonate  1,600 mg Oral TID WC   sodium chloride flush  10-40 mL Intracatheter Q12H   Continuous Infusions: PRN Meds:.acetaminophen, ipratropium-albuterol, ondansetron **OR** ondansetron (ZOFRAN) IV, sodium chloride flush  Allergies as of 03/15/2021   (No Known Allergies)    Family History  Problem Relation Age of Onset   CAD Mother     Social History   Socioeconomic History   Marital status: Widowed    Spouse name: Not on file   Number of children: Not on file   Years of education: Not on file   Highest education level: Not on file  Occupational History   Occupation:  retired  Tobacco Use   Smoking status: Former    Packs/day: 0.50    Types: Cigarettes    Quit date: 03/23/2018    Years since quitting: 3.0   Smokeless tobacco: Never  Vaping Use   Vaping Use: Never used  Substance and Sexual Activity   Alcohol use: No   Drug use: No   Sexual activity: Not Currently  Other Topics Concern  Not on file  Social History Narrative   Right handed    Facility   Social Determinants of Health   Financial Resource Strain: Low Risk    Difficulty of Paying Living Expenses: Not hard at all  Food Insecurity: No Food Insecurity   Worried About Charity fundraiser in the Last Year: Never true   Arboriculturist in the Last Year: Never true  Transportation Needs: No Transportation Needs   Lack of Transportation (Medical): No   Lack of Transportation (Non-Medical): No  Physical Activity: Inactive   Days of Exercise per Week: 0 days   Minutes of Exercise per Session: 0 min  Stress: No Stress Concern Present   Feeling of Stress : Not at all  Social Connections: Not on file  Intimate Partner Violence: Not on file    Review of Systems: All negative except as stated above in HPI.  Physical Exam: Vital signs: Vitals:   03/24/21 0422 03/24/21 0629  BP: 111/82 (!) 172/62  Pulse: 78   Resp: 18   Temp: 97.6 F (36.4 C)   SpO2: 100%    Last BM Date: 03/23/21 General:   Alert and age-appropriate appearing patient, legally blind. Lungs: No visible respiratory distress Heart: Irregularly irregular Abdomen: Soft, nontender, minimally distended, bowel sounds present.  No peritoneal sign Neuro : Alert and oriented x3 Psych : Mood and affect normal Rectal:  Deferred  GI:  Lab Results: Recent Labs    03/22/21 0639 03/23/21 0722 03/24/21 0630  WBC 13.3* 11.3* 12.0*  HGB 8.9* 7.9* 9.1*  HCT 27.1* 23.8* 25.9*  PLT 324 350 411*   BMET Recent Labs    03/22/21 0639  NA 136  K 3.7  CL 99  CO2 24  GLUCOSE 116*  BUN 53*  CREATININE 5.46*  CALCIUM  8.6*   LFT Recent Labs    03/22/21 0639  ALBUMIN 1.7*   PT/INR No results for input(s): LABPROT, INR in the last 72 hours.   Studies/Results: CT ABDOMEN PELVIS WO CONTRAST  Result Date: 03/23/2021 CLINICAL DATA:  Postoperative RIGHT upper quadrant pain, headache ups, fall in hemoglobin, post percutaneous cholecystostomy on 03/16/2021 with removal of biliary drain on 03/21/2021 EXAM: CT ABDOMEN AND PELVIS WITHOUT CONTRAST TECHNIQUE: Multidetector CT imaging of the abdomen and pelvis was performed following the standard protocol without IV contrast. RADIATION DOSE REDUCTION: This exam was performed according to the departmental dose-optimization program which includes automated exposure control, adjustment of the mA and/or kV according to patient size and/or use of iterative reconstruction technique. COMPARISON:  03/17/2021 FINDINGS: Lower chest: Small BILATERAL pleural effusions, RIGHT greater than LEFT. Significant atelectasis of RIGHT lower lobe. Minimal compressive atelectasis posterior LEFT lower lobe. Coronary arterial calcifications noted with evidence of TAVR. Hepatobiliary: Interval removal of cholecystostomy tube. Dependent calculi within gallbladder with additional new intermediate attenuation material within gallbladder lumen likely representing blood. Gallbladder remains distended. Liver unremarkable. Pancreas: Normal appearance Spleen: Normal appearance.  Small splenule adjacent to spleen. Adrenals/Urinary Tract: 2.5 x 2.1 cm diameter homogeneous LEFT adrenal mass, 32 HU attenuation on noncontrast imaging, probable adrenal adenoma. RIGHT adrenal gland unremarkable. No renal mass or hydronephrosis. Foley catheter decompresses urinary bladder. No ureteral dilatation. Stomach/Bowel: Normal appendix. Stomach and bowel loops normal appearance. Vascular/Lymphatic: Atherosclerotic calcifications aorta and iliac arteries. No adenopathy. Reproductive: Seed implants at prostate gland. Other: Small  amount of low-attenuation free fluid in pelvis and perihepatic. No free air. Small umbilical hernia containing fat. Musculoskeletal: Bones demineralized. IMPRESSION: Interval removal of cholecystostomy  tube. Persistent gallbladder distention with dependent calculi and additional intermediate attenuation material within gallbladder lumen likely representing blood/hemorrhage. Small amount of nonspecific low-attenuation free intraperitoneal fluid perihepatic and in pelvis. 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 Small BILATERAL pleural effusions and atelectasis greater on RIGHT. Small umbilical hernia containing fat. Aortic Atherosclerosis (ICD10-I70.0). Electronically Signed   By: Lavonia Dana M.D.   On: 03/23/2021 13:34    Impression/Plan: -Anemia with heme positive stool.  Hemoglobin was 7.9 yesterday and 9.1 today. -Abnormal CT scan which showing persistent gallbladder distention as well as possible blood or hemorrhage in gallbladder lumen. -History of multiple adenomas with large tubular adenoma at appendiceal orifice during colonoscopy in September 2021. -atrial fibrillation.  Eliquis on hold since yesterday ( 01/19) morning. -End-stage renal disease on dialysis  Recommendations -------------------------- -Recommend IR consult for evaluation of abnormal CT concerning for bleeding within the gallbladder.  Hopefully it has stopped since holding Eliquis.  Could be related to recent percutaneous cholecystostomy -Long discussion with patient's daughter over the phone.  Need for further evaluation of GI bleeding to decide when to resume anticoagulation discussed. -Given heme positive stool, I will arrange for EGD tomorrow. -Patient and family declined repeat colonoscopy because of his overall health status -Patient and family agreeable for EGD tomorrow. -Discussed with Dr. Annamaria Boots   Risks (bleeding, infection, bowel perforation that could require surgery, sedation-related changes in cardiopulmonary  systems), benefits (identification and possible treatment of source of symptoms, exclusion of certain causes of symptoms), and alternatives (watchful waiting, radiographic imaging studies, empiric medical treatment)  were explained to patient/family in detail and patient wishes to proceed.   LOS: 9 days   Otis Brace  MD, FACP 03/24/2021, 9:28 AM  Contact #  [phone]

## 2021-03-24 NOTE — Progress Notes (Signed)
[facility] Progress Note   Subjective:    Seen and examined patient at bedside. No complaints. Denies SOB, CP, and N/V. He continues to ask if he is going home today. Plan for HD later this evening per his usual schedule.  Objective Vitals:   03/23/21 1947 03/24/21 0002 03/24/21 0422 03/24/21 0629  BP: 97/68 134/61 111/82 (!) 172/62  Pulse: 81 78 78   Resp: 18 18 18    Temp: 97.9 F (36.6 C) 98.2 F (36.8 C) 97.6 F (36.4 C)   TempSrc: Oral Oral Oral   SpO2: 99% 100% 100%   Weight:      Height:       Physical Exam General: Blind, lying in bed, NAD Heart: Normal S1 and S2; No murmurs, gallops, or rubs Lungs: Clear anteriorly and laterally Abdomen: Soft and non-tender Extremities: No edema BLLE Dialysis Access: L AVF (+) Bruit/Thrill   Filed Weights   03/22/21 1509 03/22/21 1819 03/23/21 0311  Weight: 65.8 kg 65.8 kg 66.1 kg    Intake/Output Summary (Last 24 hours) at 03/24/2021 1033 Last data filed at 03/24/2021 0500 Gross per 24 hour  Intake 360 ml  Output 150 ml  Net 210 ml    Additional Objective Labs: Basic Metabolic Panel: Recent Labs  Lab 03/20/21 0600 03/21/21 0840 03/22/21 0639  NA 135 136 136  K 3.2* 3.3* 3.7  CL 96* 98 99  CO2 24 25 24   GLUCOSE 163* 133* 116*  BUN 45* 36* 53*  CREATININE 6.44* 4.34* 5.46*  CALCIUM 8.6* 8.3* 8.6*  PHOS 4.0 2.5 3.3   Liver Function Tests: Recent Labs  Lab 03/18/21 0325 03/20/21 0600 03/21/21 0840 03/22/21 0639  AST 30  --   --   --   ALT 7  --   --   --   ALKPHOS 79  --   --   --   BILITOT 1.0  --   --   --   PROT 6.3*  --   --   --   ALBUMIN 2.0* 1.7* 1.6* 1.7*   No results for input(s): LIPASE, AMYLASE in the last 168 hours. CBC: Recent Labs  Lab 03/20/21 0600 03/21/21 0840 03/22/21 0639 03/23/21 0722 03/24/21 0630  WBC 12.1* 13.2* 13.3* 11.3* 12.0*  HGB 8.5* 9.4* 8.9* 7.9* 9.1*  HCT 25.4* 27.7* 27.1* 23.8* 25.9*  MCV 102.8* 103.4* 103.8* 101.7* 101.2*  PLT 277 288 324 350  411*   Blood Culture    Component Value Date/Time   SDES IN/OUT CATH URINE 03/18/2021 1030   [place] 03/18/2021 1030   CULT  03/18/2021 1030    NO GROWTH Performed at [facility], [place] [address], [place], [place] [address]    REPTSTATUS 03/19/2021 FINAL 03/18/2021 1030    Cardiac Enzymes: No results for input(s): CKTOTAL, CKMB, CKMBINDEX, TROPONINI in the last 168 hours. CBG: Recent Labs  Lab 03/23/21 1627 03/23/21 1958 03/23/21 2357 03/24/21 0418 03/24/21 0749  GLUCAP 128* 130* 125* 89 100*   Iron Studies:  Recent Labs    03/24/21 0730  IRON 17*  TIBC 101*  FERRITIN 2,489*   Lab Results  Component Value Date   INR 1.0 01/14/2020   INR 1.0 11/26/2019   INR 1.11 03/25/2018   Studies/Results: CT ABDOMEN PELVIS WO CONTRAST  Result Date: 03/23/2021 CLINICAL DATA:  Postoperative RIGHT upper quadrant pain, headache ups, fall in hemoglobin, post percutaneous cholecystostomy on 03/16/2021 with removal of biliary drain on 03/21/2021 EXAM: CT ABDOMEN AND PELVIS  WITHOUT CONTRAST TECHNIQUE: Multidetector CT imaging of the abdomen and pelvis was performed following the standard protocol without IV contrast. RADIATION DOSE REDUCTION: This exam was performed according to the departmental dose-optimization program which includes automated exposure control, adjustment of the mA and/or kV according to patient size and/or use of iterative reconstruction technique. COMPARISON:  03/17/2021 FINDINGS: Lower chest: Small BILATERAL pleural effusions, RIGHT greater than LEFT. Significant atelectasis of RIGHT lower lobe. Minimal compressive atelectasis posterior LEFT lower lobe. Coronary arterial calcifications noted with evidence of TAVR. Hepatobiliary: Interval removal of cholecystostomy tube. Dependent calculi within gallbladder with additional new intermediate attenuation material within gallbladder lumen likely representing blood. Gallbladder remains distended. Liver  unremarkable. Pancreas: Normal appearance Spleen: Normal appearance.  Small splenule adjacent to spleen. Adrenals/Urinary Tract: 2.5 x 2.1 cm diameter homogeneous LEFT adrenal mass, 32 HU attenuation on noncontrast imaging, probable adrenal adenoma. RIGHT adrenal gland unremarkable. No renal mass or hydronephrosis. Foley catheter decompresses urinary bladder. No ureteral dilatation. Stomach/Bowel: Normal appendix. Stomach and bowel loops normal appearance. Vascular/Lymphatic: Atherosclerotic calcifications aorta and iliac arteries. No adenopathy. Reproductive: Seed implants at prostate gland. Other: Small amount of low-attenuation free fluid in pelvis and perihepatic. No free air. Small umbilical hernia containing fat. Musculoskeletal: Bones demineralized. IMPRESSION: Interval removal of cholecystostomy tube. Persistent gallbladder distention with dependent calculi and additional intermediate attenuation material within gallbladder lumen likely representing blood/hemorrhage. Small amount of nonspecific low-attenuation free intraperitoneal fluid perihepatic and in pelvis. 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 Small BILATERAL pleural effusions and atelectasis greater on RIGHT. Small umbilical hernia containing fat. Aortic Atherosclerosis (ICD10-I70.0). Electronically Signed   By: Lavonia Dana M.D.   On: 03/23/2021 13:34    Medications:  sodium chloride     sodium chloride      (feeding supplement) PROSource Plus  30 mL Oral BID BM   brimonidine  1 drop Right Eye TID   carbidopa-levodopa  1 tablet Oral TID   cefdinir  300 mg Oral Q M,W,F-1800   Chlorhexidine Gluconate Cloth  6 each Topical Q0600   cinacalcet  60 mg Oral Q M,W,F-HD   darbepoetin (ARANESP) injection - DIALYSIS  40 mcg Intravenous Q Mon-HD   doxercalciferol  2 mcg Intravenous Q M,W,F-HD   folic acid  1 mg Oral Daily   guaiFENesin  600 mg Oral BID   insulin aspart  0-6 Units Subcutaneous Q4H   latanoprost  1 drop Right Eye QHS    pantoprazole (PROTONIX) IV  40 mg Intravenous Q24H   pilocarpine  1 drop Right Eye QID   sevelamer carbonate  1,600 mg Oral TID WC   sodium chloride flush  10-40 mL Intracatheter Q12H    Dialysis Orders: [place] MWF  4h  400/600  66kg 2/2 bath  P2  AVF  Hep none  - no esa/ Fe  - sensipar 60 mg po tiw  - hectorol 3 ug tiw IV  Assessment/Plan: Acute Cholelithiasis: Per primary. S/P perc chole tube by IR 1/12. Tube pulled out 1/17.  Surgery signed off. Per primary/IR  New onset atrial fib w/ RVR - converted to sinus 1/14 seen by cardiology. On Eliquis for now.  Acute urinary retention/UTI/hydronephrosis. Urology consulted. Foley in place. On IV Zosyn >>now PO Omnicef per ID.  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 Back on schedule. Plan for HD later this evening BP/volume  - Soft BP's.  Holding home BP meds. Well below EDW. Minimal UF with HD.   Anemia  - Hgb trending downward. Started ESA. Got Aranesp  40 on 1/16. Hgb now 9.1. 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  Changed to Renvela binders. Lower VDRA dose. Continue sensipar.   Nutrition - Albumin <2. Protein supps ordered. Renal diet.  Parkinson's disease-per primary DMT2-per primary H/O TAVR DNR  Tobie Poet, NP [facility] 03/24/2021,10:33 AM  LOS: 9 days

## 2021-03-25 ENCOUNTER — Inpatient Hospital Stay (HOSPITAL_COMMUNITY): Payer: Medicare Other | Admitting: Anesthesiology

## 2021-03-25 ENCOUNTER — Encounter (HOSPITAL_COMMUNITY): Payer: Self-pay | Admitting: Internal Medicine

## 2021-03-25 ENCOUNTER — Encounter (HOSPITAL_COMMUNITY)
Admission: EM | Disposition: A | Discharge status: Skilled Nursing Facility | Payer: Self-pay | Source: Skilled Nursing Facility | Attending: Internal Medicine

## 2021-03-25 HISTORY — PX: BIOPSY: SHX5522

## 2021-03-25 HISTORY — PX: ESOPHAGOGASTRODUODENOSCOPY (EGD) WITH PROPOFOL: SHX5813

## 2021-03-25 LAB — GLUCOSE, CAPILLARY
Glucose-Capillary: 117 mg/dL — ABNORMAL HIGH (ref 70–99)
Glucose-Capillary: 91 mg/dL (ref 70–99)
Glucose-Capillary: 80 mg/dL (ref 70–99)
Glucose-Capillary: 98 mg/dL (ref 70–99)

## 2021-03-25 LAB — CBC
HCT: 27.3 % — ABNORMAL LOW (ref 39.0–52.0)
Hemoglobin: 9.4 g/dL — ABNORMAL LOW (ref 13.0–17.0)
MCH: 34.9 pg — ABNORMAL HIGH (ref 26.0–34.0)
MCHC: 34.4 g/dL (ref 30.0–36.0)
MCV: 101.5 fL — ABNORMAL HIGH (ref 80.0–100.0)
Platelets: 396 10*3/uL (ref 150–400)
RBC: 2.69 MIL/uL — ABNORMAL LOW (ref 4.22–5.81)
RDW: 14.4 % (ref 11.5–15.5)
WBC: 11.5 10*3/uL — ABNORMAL HIGH (ref 4.0–10.5)
nRBC: 0 % (ref 0.0–0.2)

## 2021-03-25 SURGERY — ESOPHAGOGASTRODUODENOSCOPY (EGD) WITH PROPOFOL
Anesthesia: Monitor Anesthesia Care

## 2021-03-25 MED ORDER — NYSTATIN 100000 UNIT/ML MT SUSP
5.0000 mL | Freq: Four times a day (QID) | OROMUCOSAL | Status: DC
  Filled 2021-03-25: qty 5

## 2021-03-25 MED ORDER — SODIUM CHLORIDE 0.9 % IV SOLN
INTRAVENOUS | Status: AC | PRN
  Administered 2021-03-25: 500 mL via INTRAVENOUS

## 2021-03-25 MED ORDER — PROPOFOL 500 MG/50ML IV EMUL
INTRAVENOUS | Status: DC | PRN
  Administered 2021-03-25: 100 ug/kg/min via INTRAVENOUS

## 2021-03-25 MED ORDER — POLYETHYLENE GLYCOL 3350 17 G PO PACK
17.0000 g | PACK | Freq: Every day | ORAL | Status: DC
  Administered 2021-03-26: 17 g via ORAL
  Filled 2021-03-25: qty 1

## 2021-03-25 MED ORDER — SODIUM CHLORIDE 0.9 % IV SOLN: INTRAVENOUS | Status: DC | PRN

## 2021-03-25 MED ORDER — SENNOSIDES-DOCUSATE SODIUM 8.6-50 MG PO TABS
1.0000 | ORAL_TABLET | Freq: Two times a day (BID) | ORAL | Status: DC
  Administered 2021-03-25 – 2021-03-26 (×2): 1 via ORAL
  Filled 2021-03-25 (×2): qty 1

## 2021-03-25 MED ORDER — CLOPIDOGREL BISULFATE 75 MG PO TABS
75.0000 mg | ORAL_TABLET | Freq: Every day | ORAL | Status: DC
  Administered 2021-03-26: 75 mg via ORAL
  Filled 2021-03-25: qty 1

## 2021-03-25 SURGICAL SUPPLY — 15 items

## 2021-03-25 NOTE — Progress Notes (Signed)
[facility] Progress Note   Subjective:    Seen and examined patient at bedside. S/p EGD today. Currently reports mild nausea and intermittent hiccups. Ran even during yesterday's HD.   Objective Vitals:   03/25/21 1123 03/25/21 1133 03/25/21 1143 03/25/21 1632  BP: (!) 104/13 (!) 116/24 (!) 126/16   Pulse: 78 82 80 84  Resp: 17 (!) 21 19 19   Temp: 97.7 F (36.5 C)     TempSrc: Temporal     SpO2: 96% 100% 100% 99%  Weight:      Height:       Physical Exam General: Blind, lying in bed, NAD Heart: Normal S1 and S2; No murmurs, gallops, or rubs Lungs: Clear anteriorly and laterally Abdomen: Soft and non-tender Extremities: No edema BLLE Dialysis Access: L AVF (+) Bruit/Thrill     Filed Weights   03/24/21 2030 03/25/21 0028 03/25/21 1028  Weight: 69 kg 68.2 kg 68.2 kg    Intake/Output Summary (Last 24 hours) at 03/25/2021 1755 Last data filed at 03/25/2021 1121 Gross per 24 hour  Intake 100 ml  Output 0 ml  Net 100 ml    Additional Objective Labs: Basic Metabolic Panel: Recent Labs  Lab 03/20/21 0600 03/21/21 0840 03/22/21 0639  NA 135 136 136  K 3.2* 3.3* 3.7  CL 96* 98 99  CO2 24 25 24   GLUCOSE 163* 133* 116*  BUN 45* 36* 53*  CREATININE 6.44* 4.34* 5.46*  CALCIUM 8.6* 8.3* 8.6*  PHOS 4.0 2.5 3.3   Liver Function Tests: Recent Labs  Lab 03/20/21 0600 03/21/21 0840 03/22/21 0639  ALBUMIN 1.7* 1.6* 1.7*   No results for input(s): LIPASE, AMYLASE in the last 168 hours. CBC: Recent Labs  Lab 03/21/21 0840 03/22/21 0639 03/23/21 0722 03/24/21 0630 03/25/21 0420  WBC 13.2* 13.3* 11.3* 12.0* 11.5*  HGB 9.4* 8.9* 7.9* 9.1* 9.4*  HCT 27.7* 27.1* 23.8* 25.9* 27.3*  MCV 103.4* 103.8* 101.7* 101.2* 101.5*  PLT 288 324 350 411* 396   Blood Culture    Component Value Date/Time   SDES IN/OUT CATH URINE 03/18/2021 1030   [place] 03/18/2021 1030   CULT  03/18/2021 1030    NO GROWTH Performed at [facility] [address] [place], [place] [address]    REPTSTATUS 03/19/2021 FINAL 03/18/2021 1030    Cardiac Enzymes: No results for input(s): CKTOTAL, CKMB, CKMBINDEX, TROPONINI in the last 168 hours. CBG: Recent Labs  Lab 03/24/21 2010 03/25/21 0132 03/25/21 0446 03/25/21 0801 03/25/21 1630  GLUCAP 95 117* 91 80 98   Iron Studies:  Recent Labs    03/24/21 0730  IRON 17*  TIBC 101*  FERRITIN 2,489*   Lab Results  Component Value Date   INR 1.0 01/14/2020   INR 1.0 11/26/2019   INR 1.11 03/25/2018   Studies/Results: No results found.  Medications:   (feeding supplement) PROSource Plus  30 mL Oral BID BM   brimonidine  1 drop Right Eye TID   carbidopa-levodopa  1 tablet Oral TID   cefdinir  300 mg Oral Q M,W,F-1800   Chlorhexidine Gluconate Cloth  6 each Topical Q0600   cinacalcet  60 mg Oral Q M,W,F-HD   [START ON 03/26/2021] clopidogrel  75 mg Oral Daily   darbepoetin (ARANESP) injection - DIALYSIS  40 mcg Intravenous Q Mon-HD   doxercalciferol  2 mcg Intravenous Q M,W,F-HD   folic acid  1 mg Oral Daily   guaiFENesin  600 mg Oral BID  insulin aspart  0-6 Units Subcutaneous Q4H   latanoprost  1 drop Right Eye QHS   neomycin-bacitracin-polymyxin   Topical Daily   nystatin  5 mL Oral QID   pantoprazole  40 mg Oral Daily   pilocarpine  1 drop Right Eye QID   polyethylene glycol  17 g Oral Daily   senna-docusate  1 tablet Oral BID   sevelamer carbonate  1,600 mg Oral TID WC   sodium chloride flush  10-40 mL Intracatheter Q12H    Dialysis Orders: [facility] MWF  4h  400/600  66kg 2/2 bath  P2  AVF  Hep none  - no esa/ Fe  - sensipar 60 mg po tiw  - hectorol 3 ug tiw IV  Assessment/Plan: Acute Cholelithiasis: Per primary. S/P perc chole tube by IR 1/12. Tube pulled out 1/17.  Surgery signed off. Per primary/IR  New onset atrial fib w/ RVR - converted to sinus 1/14 seen by cardiology. On Eliquis for now.  Acute urinary retention/UTI/hydronephrosis. Urology consulted. Foley in  place. On IV Zosyn >>now PO Omnicef per ID.  Heme (+) Stool-S/p EGD today-showed possible mild candidiasis and gastritis.  No evidence of bleeding or ulcer disease. Reviewed previous notes: family does not want to pursue Colonoscopy at this time. 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 Back on schedule. Next HD 03/27/21. BP/volume  - Soft BP's.  Holding home BP meds. Well below EDW. Minimal UF with HD.   Anemia  - Hgb trending downward. Started ESA. Got Aranesp 40 on 1/16. Hgb now 9.4. 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  Changed to Renvela binders. Lower VDRA dose. Continue sensipar.   Nutrition - Albumin <2. Protein supps ordered. Renal diet.  Parkinson's disease-per primary DMT2-per primary H/O TAVR DNR    Tobie Poet, NP [facility] 03/25/2021,5:55 PM  LOS: 10 days

## 2021-03-25 NOTE — Anesthesia Postprocedure Evaluation (Signed)
Anesthesia Post Note  Patient: Elijah White  Procedure(s) Performed: ESOPHAGOGASTRODUODENOSCOPY (EGD) WITH PROPOFOL BIOPSY     Patient location during evaluation: PACU Anesthesia Type: MAC Level of consciousness: awake Pain management: pain level controlled Vital Signs Assessment: post-procedure vital signs reviewed and stable Respiratory status: spontaneous breathing, nonlabored ventilation, respiratory function stable and patient connected to nasal cannula oxygen Cardiovascular status: stable and blood pressure returned to baseline Postop Assessment: no apparent nausea or vomiting Anesthetic complications: no   No notable events documented.  Last Vitals:  Vitals:   03/25/21 1133 03/25/21 1143  BP: (!) 116/24 (!) 126/16  Pulse: 82 80  Resp: (!) 21 19  Temp:    SpO2: 100% 100%    Last Pain:  Vitals:   03/25/21 1143  TempSrc:   PainSc: 0-No pain                 Kamelia Lampkins P Delmon [place]

## 2021-03-25 NOTE — Brief Op Note (Signed)
03/15/2021 - 03/25/2021  11:27 AM  PATIENT:  Elijah White  81 y.o. male  PRE-OPERATIVE DIAGNOSIS:  Anemia, heme positive stool  POST-OPERATIVE DIAGNOSIS:  candida in the esophagus, gastric biopsies  PROCEDURE:  Procedure(s): ESOPHAGOGASTRODUODENOSCOPY (EGD) WITH PROPOFOL (N/A) BIOPSY  SURGEON:  Surgeon(s) and Role:    * Roizy Harold, MD - Primary  Findings ------------ -EGD showed possible mild candidiasis and gastritis.  No evidence of bleeding or ulcer disease.  Recommendation ------------------------- -Advance diet as tolerated -Add nystatin swish and swallow for 5 days -Protonix 40 mg once a day -Called and discussed with the family.  As mentioned yesterday, patient and family does not want colonoscopy at this time. -Okay to resume anticoagulation tomorrow from GI standpoint.  Patient's daughter was advised to bring patient back to hospital if he has any evidence of bleeding after starting anticoagulation. -No further inpatient GI work-up planned per family request.  GI will sign off.  Call us back if needed   Otis Brace MD, [place] 03/25/2021, 11:29 AM  Contact #  [phone]

## 2021-03-25 NOTE — Anesthesia Procedure Notes (Signed)
Procedure Name: MAC Date/Time: 03/25/2021 11:03 AM Performed by: Eligha Bridegroom, CRNA Pre-anesthesia Checklist: Patient identified, Emergency Drugs available, Suction available, Patient being monitored and Timeout performed Patient Re-evaluated:Patient Re-evaluated prior to induction Oxygen Delivery Method: Nasal cannula Preoxygenation: Pre-oxygenation with 100% oxygen Induction Type: IV induction

## 2021-03-25 NOTE — Anesthesia Preprocedure Evaluation (Signed)
Anesthesia Evaluation  Patient identified by MRN, date of birth, ID band Patient awake    Reviewed: Allergy & Precautions, NPO status , Patient's Chart, lab work & pertinent test results  Airway Mallampati: II  TM Distance: >3 FB Neck ROM: Full    Dental no notable dental hx. (+) Edentulous Upper, Missing   Pulmonary former smoker,    Pulmonary exam normal breath sounds clear to auscultation       Cardiovascular hypertension, + Peripheral Vascular Disease  Normal cardiovascular exam+ dysrhythmias Atrial Fibrillation  Rhythm:Regular Rate:Normal  ECHO: Left ventricular ejection fraction, by estimation, is 60 to 65%. The left ventricle has normal function. There is moderate concentric left ventricular hypertrophy. Right ventricular systolic function is normal. The right ventricular size is normal. The mitral valve is degenerative. Severe mitral annular calcification. 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 Echo findings are consistent with normal structure and function of the aortic valve prosthesis. Aortic valve mean gradient measures 5.0 mmHg. Aortic valve Vmax measures 1.40 m/s. DI 0.8. There is no paravalvular leak. The inferior vena cava is normal in size with <50% respiratory variability, suggesting right atrial pressure of 8 mmHg.   Neuro/Psych Legally blind in left eye negative psych ROS   GI/Hepatic Neg liver ROS, GERD  Medicated and Controlled,  Endo/Other  diabetes  Renal/GU Dialysis and ESRFRenal disease     Musculoskeletal  (+) Arthritis ,   Abdominal   Peds  Hematology  (+) anemia ,   Anesthesia Other Findings Anemia Heme positive stool  Reproductive/Obstetrics                             Anesthesia Physical Anesthesia Plan  ASA: 3  Anesthesia Plan: General and MAC   Post-op Pain  Management:    Induction: Intravenous  PONV Risk Score and Plan: 2 and Propofol infusion and Treatment may vary due to age or medical condition  Airway Management Planned: Nasal Cannula  Additional Equipment:   Intra-op Plan:   Post-operative Plan:   Informed Consent: I have reviewed the patients History and Physical, chart, labs and discussed the procedure including the risks, benefits and alternatives for the proposed anesthesia with the patient or authorized representative who has indicated his/her understanding and acceptance.     Dental advisory given  Plan Discussed with: CRNA  Anesthesia Plan Comments:         Anesthesia Quick Evaluation

## 2021-03-25 NOTE — TOC Progression Note (Signed)
Transition of Care [facility]) - Progression Note    Patient Details  Name: [facility] MRN: [id number] Date of Birth: December 21, 1940  Transition [facility] CM/SW [place], LCSW Phone Number:[phone] 03/25/2021, 12:15 PM  Clinical Narrative:    CSW received a call from [place] at [facility] inquiring about pt's dc. CSW followed up with MD. Pt not medically ready today. Nikki at [employer] [employer] was notified.  TOC team will continue to assist with discharge planning needs.    Expected Discharge Plan: [place] Barriers to Discharge: No Barriers Identified  Expected Discharge Plan and Services Expected Discharge Plan: [place] In-house Referral: Clinical Social Work     Living arrangements for the past 2 months: [place] Expected Discharge Date: 03/23/21                                     Social Determinants of Health (SDOH) Interventions    Readmission Risk Interventions Readmission Risk Prevention Plan 03/17/2021  Transportation Screening Complete  HRI or Home Care Consult Complete  SW Recovery Care/Counseling Consult Complete  Skilled Nursing Facility Complete  Some recent data might be hidden

## 2021-03-25 NOTE — Transfer of Care (Signed)
Immediate Anesthesia Transfer of Care Note  Patient: Elijah White  Procedure(s) Performed: ESOPHAGOGASTRODUODENOSCOPY (EGD) WITH PROPOFOL BIOPSY  Patient Location: Endoscopy Unit  Anesthesia Type:MAC  Level of Consciousness: drowsy  Airway & Oxygen Therapy: Patient Spontanous Breathing  Post-op Assessment: Report given to RN and Post -op Vital signs reviewed and stable  Post vital signs: Reviewed and stable  Last Vitals:  Vitals Value Taken Time  BP 104/13 03/25/21 1123  Temp    Pulse 79 03/25/21 1125  Resp 17 03/25/21 1125  SpO2 97 % 03/25/21 1125  Vitals shown include unvalidated device data.  Last Pain:  Vitals:   03/25/21 1123  TempSrc:   PainSc: Asleep      Patients Stated Pain Goal: 0 (03/49/17 9150)  Complications: No notable events documented.

## 2021-03-25 NOTE — Progress Notes (Signed)
0056-PT given Zofran 4 mg IVP due to complaints of nausea.   0300-Paged hospitalist about pt still having nausea after given Zofran 4 mg IVP. Waiting for call back for MD.  0400- Pt given warm compress to apply to abd. Pt stated has helped somewhat.   0435- Paged Dr. Marlowe Sax about pt again and awaiting call back. Pt is now complaining of cramping and pain feeling the urge to defecate.  0545- Call back received from Dr. Marlowe Sax to give pt Zofran 4 mg IVP NOW. Notified the MD about pt having complaints of pain also discussed pt's plan of care for today as well. Pt has plan for EGD today. Will continue to monitor pt closely for further issues.

## 2021-03-25 NOTE — Progress Notes (Signed)
PROGRESS NOTE    Elijah White  WUJ:[id number] DOB: [birth year] DOA: 03/15/2021 PCP: Maryella Shivers, MD    Chief Complaint  Patient presents with   Abdominal Pain    Brief Narrative:  Elijah White is a 81 y.o. male with medical history significant of ESRD on HD MWF, aortic stenosis status post TAVR 2021, nonobstructive CAD, Parkinson's disease, PVD on Plavix, s/p left BKA, HTN, IIDM, HLD,  came with recurrent right upper quadrant abdominal pain, found to have severe sepsis from pyelocystitis/acute cholecystitis, also has new onset of A. fib RVR  Subjective:  He is seen after returning from EGD, he reports feeling nauseous and having ab pain no overt external bleeding There is no fever He reports intermittent hiccups , I did not hear any during encounter the last three days,   Assessment & Plan:   Principal Problem:   Acute cholecystitis Active Problems:   Complicated UTI (urinary tract infection)   Severe sepsis from pyocystis/acute cholecystitis , presents on admission --cultures unrevealing -ID Dr Gale Journey input  appreciated , was on IV abx (zosyn), plan for omnicef renal dosing until 1/25   Acute urinary retention/hydronephrosis/complicated uti -was on IV abx (zosyn)- seen by ID ,change to Po abx omnicef -need to keep foley ( 8fr foley , last placed on 1/20 /2023) -follow up with urology Dr Cain Sieve for foley management/ removal      cholelithiasis/ suspect early acute cholecystitis with clinical Murphy sign positive on admission -He presents with Acute RUQ abdominal pain with leucocytosis --was on IV abx (zosyn)- seen by ID ,change to Po abx omnicef -GS consult/ IR consult for percutaneous drain, drain removed on 1/17 -hgb dropped without overt external bleed, reports intermittent hiccups, repeat ct ab/pel showed distended gallbladder with dependent calculi and additional intermediate attenuation material within gallbladder lumen likely representing  blood/hemorrhage -discussed with IR who do not plan for intervention unless patient develop ab pain/leukocytosis/fever, IR contact info placed on AVS ([phone]) -f/u with general surgery Dr Ninfa Linden 05/09/2021  1/21 Patient is c/o diffuse ab pain today with nausea, seems getting better after bm, monitor , if pain free can discharge tomorrow, if pain persist will contact IR  RLL PNA vs atelectasis -CT scan on 1/13 suggestive of perhaps a Right lower lobe PNA vs atelectasis, -he did vomit initially on admission, there was  a concern of aspiration pneumonia -he is  on abx already- -he is seen by speech, currently on dys2/thin liquid -Repeat CT on 1/19 showed "Small BILATERAL pleural effusions and atelectasis greater on RIGHT" -- continue pulmonary toilet, he is on room air, I did not hear any cough   Macrocytic anemia -Drop off hemoglobin without overt external bleed -Per chart review she is status post colonoscopy in 11/2019 which showed several polyps status post removal and internal hemorrhoids otherwise unremarkable colonoscopy -PRBC transfusion if hemoglobin less than 7 - start folic acid supplement -Cardiology recommend hold Eliquis for now -FOBT+  -s/p EGD showed " EGD showed possible mild candidiasis and gastritis.  No evidence of bleeding or ulcer disease"  , family does not want to pursue with colonoscopy for now -Add nystatin swish and swallow for 5 days -Protonix 40 mg once a day -need to monitor hgb, contact GI if overt bleeding or further drop of hgb    A fib with RVR, new diagnosis this hospitalization -dig x 1 and brief amiodarone , currently only on eliquis, he is back to sinus rhyrhm -cards consult much appreciated, he may not need  long term antiarrythmic meds or anticoagulation -Eliquis discontinued per cardiology recommendation due to concern of acute anemia -cards signed off on 1/20 -consider lower dose lopressor if bp allows  - he is to follow up with cards /afib  clinic   PAD s/p left BKA, on plavix prior to admission, this has been on hold since admission, plan to resume at discharge   HTN -holding home meds due to hypotension -consider low dose lopressor with holding parameters if bp allows   ESRD on HD MWF -nephrology consulted.   Parkinson's disease -continue Sinemet    Diabetic neuropathy -Continue gabapentin    Noninsulin-dependent type 2 diabetes/diabetic neuropathy --Appear has been diet controlled, recent A1c 7% -SSI for now  Legally blind  FTT, long term care at SNF, will benefit from palliative care following at skilled nursing facility    Body mass index is 25.02 kg/m..  .  Unresulted Labs (From admission, onward)     Start     Ordered   03/26/21 0500  Comprehensive metabolic panel  Tomorrow morning,   R       Question:  Specimen collection method  Answer:  Unit=Unit collect   03/25/21 0813   03/19/21 0500  CBC  Daily,   R     Question:  Specimen collection method  Answer:  Lab=Lab collect   03/18/21 1242   Unscheduled  Occult blood card to lab, stool  As needed,   R      03/23/21 1002              DVT prophylaxis: scd's for now, concerning for acute anemia   Code Status: DNR Family Communication: Patient, daughter updated over the phone on 1/19 and 1/21 Disposition:   Status is: Inpatient   Dispo: The patient is from: SNF              Anticipated d/c is to: SNF              Anticipated d/c date is: likely on 1/22 if no ab pain, need to remove picc line prior to discharge                 Consultants:  General surgery IR ID Cardiology Urology  Procedures:  Gallbladder drain placement and removal  Antimicrobials:   Anti-infectives (From admission, onward)    Start     Dose/Rate Route Frequency Ordered Stop   03/24/21 0000  cefdinir (OMNICEF) 300 MG capsule        300 mg Oral Every M-W-F (1800) 03/23/21 0957 03/29/21 2359   03/22/21 1800  cefdinir (OMNICEF) capsule 300 mg        300  mg Oral Every M-W-F (1800) 03/21/21 1400 03/29/21 2359   03/21/21 1500  cefdinir (OMNICEF) capsule 300 mg        300 mg Oral  Once 03/21/21 1400 03/21/21 1631   03/21/21 1445  cefdinir (OMNICEF) capsule 300 mg  Status:  Discontinued        300 mg Oral Every 12 hours 03/21/21 1354 03/21/21 1400   03/15/21 1400  piperacillin-tazobactam (ZOSYN) IVPB 2.25 g  Status:  Discontinued        2.25 g 100 mL/hr over 30 Minutes Intravenous Every 8 hours 03/15/21 1326 03/21/21 1354   03/15/21 1330  Ampicillin-Sulbactam (UNASYN) 3 g in sodium chloride 0.9 % 100 mL IVPB  Status:  Discontinued        3 g 200 mL/hr over 30 Minutes Intravenous Every evening 03/15/21 1324  03/15/21 1326           Objective: Vitals:   03/25/21 1028 03/25/21 1123 03/25/21 1133 03/25/21 1143  BP: (!) 156/72 (!) 104/13 (!) 116/24 (!) 126/16  Pulse: 85 78 82 80  Resp: 16 17 (!) 21 19  Temp: 98.9 F (37.2 C) 97.7 F (36.5 C)    TempSrc: Temporal Temporal    SpO2: 99% 96% 100% 100%  Weight: 68.2 kg     Height: 5\' 5"  (1.651 m)       Intake/Output Summary (Last 24 hours) at 03/25/2021 1258 Last data filed at 03/25/2021 1121 Gross per 24 hour  Intake 340 ml  Output 150 ml  Net 190 ml   Filed Weights   03/24/21 2030 03/25/21 0028 03/25/21 1028  Weight: 69 kg 68.2 kg 68.2 kg    Examination:  General exam: Chronically ill-appearing , legally blind , oriented x3 , NAD Respiratory system: diminished at basis, no rales, no rhonchi, no wheezing. Respiratory effort normal. Cardiovascular system:  RRR.  Gastrointestinal system: soft but  diffuse tender, no rebound, no guarding, Normal bowel sounds heard. Central nervous system: Alert and orientedx3. No focal neurological deficits. Extremities: Left BKA, right lower extremity in heel protector, able to lift against gravity Skin: No rashes, lesions or ulcers Psychiatry: Judgement and insight appear normal. Mood & affect appropriate.     Data Reviewed: I have personally  reviewed following labs and imaging studies  CBC: Recent Labs  Lab 03/21/21 0840 03/22/21 0639 03/23/21 0722 03/24/21 0630 03/25/21 0420  WBC 13.2* 13.3* 11.3* 12.0* 11.5*  HGB 9.4* 8.9* 7.9* 9.1* 9.4*  HCT 27.7* 27.1* 23.8* 25.9* 27.3*  MCV 103.4* 103.8* 101.7* 101.2* 101.5*  PLT 288 324 350 411* 160    Basic Metabolic Panel: Recent Labs  Lab 03/20/21 0600 03/21/21 0840 03/22/21 0639  NA 135 136 136  K 3.2* 3.3* 3.7  CL 96* 98 99  CO2 24 25 24   GLUCOSE 163* 133* 116*  BUN 45* 36* 53*  CREATININE 6.44* 4.34* 5.46*  CALCIUM 8.6* 8.3* 8.6*  PHOS 4.0 2.5 3.3    GFR: Estimated Creatinine Clearance: 9.1 mL/min (A) (by C-G formula based on SCr of 5.46 mg/dL (H)).  Liver Function Tests: Recent Labs  Lab 03/20/21 0600 03/21/21 0840 03/22/21 0639  ALBUMIN 1.7* 1.6* 1.7*    CBG: Recent Labs  Lab 03/24/21 1642 03/24/21 2010 03/25/21 0132 03/25/21 0446 03/25/21 0801  GLUCAP 114* 95 117* 91 80     Recent Results (from the past 240 hour(s))  Resp Panel by RT-PCR (Flu A&B, Covid) Nasopharyngeal Swab     Status: None   Collection Time: 03/15/21  1:35 PM   Specimen: Nasopharyngeal Swab; Nasopharyngeal(NP) swabs in vial transport medium  Result Value Ref Range Status   SARS Coronavirus 2 by RT PCR NEGATIVE NEGATIVE Final    Comment: (NOTE) SARS-CoV-2 target nucleic acids are NOT DETECTED.  The SARS-CoV-2 RNA is generally detectable in upper respiratory specimens during the acute phase of infection. The lowest concentration of SARS-CoV-2 viral copies this assay can detect is 138 copies/mL. A negative result does not preclude SARS-Cov-2 infection and should not be used as the sole basis for treatment or other patient management decisions. A negative result may occur with  improper specimen collection/handling, submission of specimen other than nasopharyngeal swab, presence of viral mutation(s) within the areas targeted by this assay, and inadequate number of  viral copies(<138 copies/mL). A negative result must be combined with clinical observations, patient history,  and epidemiological information. The expected result is Negative.  Fact Sheet for Patients:  EntrepreneurPulse.com.au  Fact Sheet for Healthcare Providers:  IncredibleEmployment.be  This test is no t yet approved or cleared by the [place] FDA and  has been authorized for detection and/or diagnosis of SARS-CoV-2 by FDA under an Emergency Use Authorization (EUA). This EUA will remain  in effect (meaning this test can be used) for the duration of the COVID-19 declaration under Section 564(b)(1) of the Act, 21 U.S.C.section 360bbb-3(b)(1), unless the authorization is terminated  or revoked sooner.       Influenza A by PCR NEGATIVE NEGATIVE Final   Influenza B by PCR NEGATIVE NEGATIVE Final    Comment: (NOTE) The Xpert Xpress SARS-CoV-2/FLU/RSV plus assay is intended as an aid in the diagnosis of influenza from Nasopharyngeal swab specimens and should not be used as a sole basis for treatment. Nasal washings and aspirates are unacceptable for Xpert Xpress SARS-CoV-2/FLU/RSV testing.  Fact Sheet for Patients: EntrepreneurPulse.com.au  Fact Sheet for Healthcare Providers: IncredibleEmployment.be  This test is not yet approved or cleared by the [place] FDA and has been authorized for detection and/or diagnosis of SARS-CoV-2 by FDA under an Emergency Use Authorization (EUA). This EUA will remain in effect (meaning this test can be used) for the duration of the COVID-19 declaration under Section 564(b)(1) of the Act, 21 U.S.C. section 360bbb-3(b)(1), unless the authorization is terminated or revoked.  Performed at [facility], [place] [address], [place], [place] [address]   Body fluid culture w Gram Stain     Status: None   Collection Time: 03/16/21  8:16 AM   Specimen: BILE; Body  Fluid  Result Value Ref Range Status   Specimen Description BILE  Final   Special Requests BILE  Final   Gram Stain NO WBC SEEN NO ORGANISMS SEEN   Final   Culture   Final    NO GROWTH 3 DAYS Performed at [facility], [place] [address], [place], [place] [address]    Report Status 03/19/2021 FINAL  Final  Culture, blood (routine x 2)     Status: None   Collection Time: 03/17/21  3:07 AM   Specimen: BLOOD RIGHT HAND  Result Value Ref Range Status   Specimen Description BLOOD RIGHT HAND  Final   Special Requests AEROBIC BOTTLE ONLY Blood Culture adequate volume  Final   Culture   Final    NO GROWTH 5 DAYS Performed at [facility], [place] [address], [place], [place] [address]    Report Status 03/22/2021 FINAL  Final  Culture, blood (routine x 2)     Status: None   Collection Time: 03/17/21  3:17 AM   Specimen: BLOOD RIGHT HAND  Result Value Ref Range Status   Specimen Description BLOOD RIGHT HAND  Final   Special Requests   Final    AEROBIC BOTTLE ONLY Blood Culture results may not be optimal due to an inadequate volume of blood received in culture bottles   Culture   Final    NO GROWTH 5 DAYS Performed at [facility], [place] [address], [place], [place] [address]    Report Status 03/22/2021 FINAL  Final  Urine Culture     Status: None   Collection Time: 03/18/21 10:30 AM   Specimen: In/Out Cath Urine  Result Value Ref Range Status   Specimen Description IN/OUT CATH URINE  Final   Special Requests NONE  Final   Culture  Final    NO GROWTH Performed at [facility], [place] [address] [place], [place] [address]    Report Status 03/19/2021 FINAL  Final  Resp Panel by RT-PCR (Flu A&B, Covid) Nasopharyngeal Swab     Status: None   Collection Time: 03/22/21 12:42 PM   Specimen: Nasopharyngeal Swab; Nasopharyngeal(NP) swabs in vial transport medium  Result Value Ref Range Status   SARS Coronavirus 2 by RT PCR NEGATIVE NEGATIVE Final    Comment:  (NOTE) SARS-CoV-2 target nucleic acids are NOT DETECTED.  The SARS-CoV-2 RNA is generally detectable in upper respiratory specimens during the acute phase of infection. The lowest concentration of SARS-CoV-2 viral copies this assay can detect is 138 copies/mL. A negative result does not preclude SARS-Cov-2 infection and should not be used as the sole basis for treatment or other patient management decisions. A negative result may occur with  improper specimen collection/handling, submission of specimen other than nasopharyngeal swab, presence of viral mutation(s) within the areas targeted by this assay, and inadequate number of viral copies(<138 copies/mL). A negative result must be combined with clinical observations, patient history, and epidemiological information. The expected result is Negative.  Fact Sheet for Patients:  EntrepreneurPulse.com.au  Fact Sheet for Healthcare Providers:  IncredibleEmployment.be  This test is no t yet approved or cleared by the [place] FDA and  has been authorized for detection and/or diagnosis of SARS-CoV-2 by FDA under an Emergency Use Authorization (EUA). This EUA will remain  in effect (meaning this test can be used) for the duration of the COVID-19 declaration under Section 564(b)(1) of the Act, 21 U.S.C.section 360bbb-3(b)(1), unless the authorization is terminated  or revoked sooner.       Influenza A by PCR NEGATIVE NEGATIVE Final   Influenza B by PCR NEGATIVE NEGATIVE Final    Comment: (NOTE) The Xpert Xpress SARS-CoV-2/FLU/RSV plus assay is intended as an aid in the diagnosis of influenza from Nasopharyngeal swab specimens and should not be used as a sole basis for treatment. Nasal washings and aspirates are unacceptable for Xpert Xpress SARS-CoV-2/FLU/RSV testing.  Fact Sheet for Patients: EntrepreneurPulse.com.au  Fact Sheet for Healthcare  Providers: IncredibleEmployment.be  This test is not yet approved or cleared by the [place] FDA and has been authorized for detection and/or diagnosis of SARS-CoV-2 by FDA under an Emergency Use Authorization (EUA). This EUA will remain in effect (meaning this test can be used) for the duration of the COVID-19 declaration under Section 564(b)(1) of the Act, 21 U.S.C. section 360bbb-3(b)(1), unless the authorization is terminated or revoked.  Performed at [facility], [place] [address], [place], [place] [address]          Radiology Studies: CT ABDOMEN PELVIS WO CONTRAST  Result Date: 03/23/2021 CLINICAL DATA:  Postoperative RIGHT upper quadrant pain, headache ups, fall in hemoglobin, post percutaneous cholecystostomy on 03/16/2021 with removal of biliary drain on 03/21/2021 EXAM: CT ABDOMEN AND PELVIS WITHOUT CONTRAST TECHNIQUE: Multidetector CT imaging of the abdomen and pelvis was performed following the standard protocol without IV contrast. RADIATION DOSE REDUCTION: This exam was performed according to the departmental dose-optimization program which includes automated exposure control, adjustment of the mA and/or kV according to patient size and/or use of iterative reconstruction technique. COMPARISON:  03/17/2021 FINDINGS: Lower chest: Small BILATERAL pleural effusions, RIGHT greater than LEFT. Significant atelectasis of RIGHT lower lobe. Minimal compressive atelectasis posterior LEFT lower lobe. Coronary arterial calcifications noted with evidence of TAVR. Hepatobiliary: Interval removal of cholecystostomy tube. Dependent calculi  within gallbladder with additional new intermediate attenuation material within gallbladder lumen likely representing blood. Gallbladder remains distended. Liver unremarkable. Pancreas: Normal appearance Spleen: Normal appearance.  Small splenule adjacent to spleen. Adrenals/Urinary Tract: 2.5 x 2.1 cm diameter homogeneous LEFT  adrenal mass, 32 HU attenuation on noncontrast imaging, probable adrenal adenoma. RIGHT adrenal gland unremarkable. No renal mass or hydronephrosis. Foley catheter decompresses urinary bladder. No ureteral dilatation. Stomach/Bowel: Normal appendix. Stomach and bowel loops normal appearance. Vascular/Lymphatic: Atherosclerotic calcifications aorta and iliac arteries. No adenopathy. Reproductive: Seed implants at prostate gland. Other: Small amount of low-attenuation free fluid in pelvis and perihepatic. No free air. Small umbilical hernia containing fat. Musculoskeletal: Bones demineralized. IMPRESSION: Interval removal of cholecystostomy tube. Persistent gallbladder distention with dependent calculi and additional intermediate attenuation material within gallbladder lumen likely representing blood/hemorrhage. Small amount of nonspecific low-attenuation free intraperitoneal fluid perihepatic and in pelvis. 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 Small BILATERAL pleural effusions and atelectasis greater on RIGHT. Small umbilical hernia containing fat. Aortic Atherosclerosis (ICD10-I70.0). Electronically Signed   By: Lavonia Dana M.D.   On: 03/23/2021 13:34        Scheduled Meds:  (feeding supplement) PROSource Plus  30 mL Oral BID BM   brimonidine  1 drop Right Eye TID   carbidopa-levodopa  1 tablet Oral TID   cefdinir  300 mg Oral Q M,W,F-1800   Chlorhexidine Gluconate Cloth  6 each Topical Q0600   cinacalcet  60 mg Oral Q M,W,F-HD   darbepoetin (ARANESP) injection - DIALYSIS  40 mcg Intravenous Q Mon-HD   doxercalciferol  2 mcg Intravenous Q M,W,F-HD   folic acid  1 mg Oral Daily   guaiFENesin  600 mg Oral BID   insulin aspart  0-6 Units Subcutaneous Q4H   latanoprost  1 drop Right Eye QHS   neomycin-bacitracin-polymyxin   Topical Daily   nystatin  5 mL Oral QID   pantoprazole  40 mg Oral Daily   pilocarpine  1 drop Right Eye QID   polyethylene glycol  17 g Oral Daily   senna-docusate  1  tablet Oral BID   sevelamer carbonate  1,600 mg Oral TID WC   sodium chloride flush  10-40 mL Intracatheter Q12H   Continuous Infusions:     LOS: 10 days   Time spent: 82mins Greater than 50% of this time was spent in counseling, explanation of diagnosis, planning of further management, and coordination of care.   Voice Recognition Viviann Spare dictation system was used to create this note, attempts have been made to correct errors. Please contact the author with questions and/or clarifications.   Florencia Reasons, MD PhD FACP [facility]  Available via Epic secure chat 7am-7pm for nonurgent issues Please page for urgent issues To page the attending provider between 7A-7P or the covering provider during after hours 7P-7A, please log into the web site [email or web address] and access using universal [place] password for that web site. If you do not have the password, please call the hospital operator.    03/25/2021, 12:58 PM

## 2021-03-25 NOTE — Op Note (Signed)
[facility] Patient Name: Elijah White Procedure Date : 03/25/2021 MRN: [id number] Attending MD: Otis Brace , MD Date of Birth: 09-10-1940 CSN: [id number] Age: 81 Admit Type: Outpatient Procedure:                Upper GI endoscopy Indications:              Heme positive stool Providers:                Otis Brace, MD, Mariana Arn, Benetta Spar, Technician Referring MD:              Medicines:                Sedation Administered by an Anesthesia Professional Complications:            No immediate complications. Estimated Blood Loss:     Estimated blood loss was minimal. Procedure:                Pre-Anesthesia Assessment:                           - Prior to the procedure, a History and Physical                            was performed, and patient medications and                            allergies were reviewed. The patient's tolerance of                            previous anesthesia was also reviewed. The risks                            and benefits of the procedure and the sedation                            options and risks were discussed with the patient.                            All questions were answered, and informed consent                            was obtained. Prior Anticoagulants: The patient has                            taken Eliquis (apixaban), last dose was 2 days                            prior to procedure. ASA Grade Assessment: III - A                            patient with severe systemic disease. After  reviewing the risks and benefits, the patient was                            deemed in satisfactory condition to undergo the                            procedure.                           After obtaining informed consent, the endoscope was                            passed under direct vision. Throughout the                            procedure, the patient's blood  pressure, pulse, and                            oxygen saturations were monitored continuously. The                            [id number]-H190 ([id number] Olympus endoscope was introduced                            through the mouth, and advanced to the second part                            of duodenum. The upper GI endoscopy was                            accomplished without difficulty. The patient                            tolerated the procedure well. Scope In: Scope Out: Findings:      Patchy, white plaques were found in the middle third of the esophagus.       Biopsies were taken with a cold forceps for histology.      The Z-line was regular and was found 40 cm from the incisors.      Diffuse moderate inflammation characterized by congestion (edema),       erythema and granularity was found in the gastric fundus and in the       gastric body. Biopsies were taken with a cold forceps for histology.      The cardia and gastric fundus were normal on retroflexion.      Multiple small sessile polyps with no bleeding were found in the       duodenal bulb. Biopsies were taken with a cold forceps for histology.      The first portion of the duodenum and second portion of the duodenum       were normal. Impression:               - Esophageal plaques were found, suspicious for                            candidiasis. Biopsied.                           -  Z-line regular, 40 cm from the incisors.                           - Chronic gastritis. Biopsied.                           - Multiple duodenal polyps. Biopsied.                           - Normal first portion of the duodenum and second                            portion of the duodenum. Recommendation:           - Return patient to hospital ward for ongoing care.                           - Resume previous diet.                           - Continue present medications.                           - Await pathology results. Procedure Code(s):         --- Professional ---                           [phone], Esophagogastroduodenoscopy, flexible,                            transoral; with biopsy, single or multiple Diagnosis Code(s):        --- Professional ---                           K22.9, Disease of esophagus, unspecified                           K29.50, Unspecified chronic gastritis without                            bleeding                           K31.7, Polyp of stomach and duodenum                           R19.5, Other fecal abnormalities CPT copyright 2019 [employer]. All rights reserved. The codes documented in this report are preliminary and upon coder review may  be revised to meet current compliance requirements. Otis Brace, MD Otis Brace, MD 03/25/2021 11:27:28 AM Number of Addenda: 0

## 2021-03-26 LAB — COMPREHENSIVE METABOLIC PANEL
ALT: 6 U/L (ref 0–44)
AST: 18 U/L (ref 15–41)
Albumin: 1.7 g/dL — ABNORMAL LOW (ref 3.5–5.0)
Alkaline Phosphatase: 135 U/L — ABNORMAL HIGH (ref 38–126)
Anion gap: 10 (ref 5–15)
BUN: 25 mg/dL — ABNORMAL HIGH (ref 8–23)
CO2: 26 mmol/L (ref 22–32)
Calcium: 8.2 mg/dL — ABNORMAL LOW (ref 8.9–10.3)
Chloride: 96 mmol/L — ABNORMAL LOW (ref 98–111)
Creatinine, Ser: 4.18 mg/dL — ABNORMAL HIGH (ref 0.61–1.24)
GFR, Estimated: 14 mL/min — ABNORMAL LOW (ref >60)
Glucose, Bld: 104 mg/dL — ABNORMAL HIGH (ref 70–99)
Potassium: 4 mmol/L (ref 3.5–5.1)
Sodium: 132 mmol/L — ABNORMAL LOW (ref 135–145)
Total Bilirubin: 0.7 mg/dL (ref 0.3–1.2)
Total Protein: 5.3 g/dL — ABNORMAL LOW (ref 6.5–8.1)

## 2021-03-26 LAB — GLUCOSE, CAPILLARY
Glucose-Capillary: 95 mg/dL (ref 70–99)
Glucose-Capillary: 100 mg/dL — ABNORMAL HIGH (ref 70–99)
Glucose-Capillary: 93 mg/dL (ref 70–99)
Glucose-Capillary: 117 mg/dL — ABNORMAL HIGH (ref 70–99)
Glucose-Capillary: 84 mg/dL (ref 70–99)

## 2021-03-26 LAB — CBC
HCT: 25.1 % — ABNORMAL LOW (ref 39.0–52.0)
Hemoglobin: 8.7 g/dL — ABNORMAL LOW (ref 13.0–17.0)
MCH: 35.2 pg — ABNORMAL HIGH (ref 26.0–34.0)
MCHC: 34.7 g/dL (ref 30.0–36.0)
MCV: 101.6 fL — ABNORMAL HIGH (ref 80.0–100.0)
Platelets: 403 10*3/uL — ABNORMAL HIGH (ref 150–400)
RBC: 2.47 MIL/uL — ABNORMAL LOW (ref 4.22–5.81)
RDW: 14.4 % (ref 11.5–15.5)
WBC: 12.6 10*3/uL — ABNORMAL HIGH (ref 4.0–10.5)
nRBC: 0 % (ref 0.0–0.2)

## 2021-03-26 LAB — RESP PANEL BY RT-PCR (FLU A&B, COVID) ARPGX2
Influenza A by PCR: NEGATIVE
Influenza B by PCR: NEGATIVE
SARS Coronavirus 2 by RT PCR: NEGATIVE

## 2021-03-26 LAB — TROPONIN I (HIGH SENSITIVITY): Troponin I (High Sensitivity): 41 ng/L — ABNORMAL HIGH (ref <18)

## 2021-03-26 MED ORDER — SENNOSIDES-DOCUSATE SODIUM 8.6-50 MG PO TABS: 1.0000 | ORAL_TABLET | Freq: Every day | ORAL | Status: AC

## 2021-03-26 MED ORDER — ONDANSETRON HCL 4 MG PO TABS: 4.0000 mg | ORAL_TABLET | Freq: Every day | ORAL | 0 refills | Status: DC | PRN

## 2021-03-26 MED ORDER — POLYETHYLENE GLYCOL 3350 17 G PO PACK: 17.0000 g | PACK | Freq: Every day | ORAL | 0 refills | Status: AC

## 2021-03-26 MED ORDER — NYSTATIN 100000 UNIT/ML MT SUSP: 5.0000 mL | Freq: Four times a day (QID) | OROMUCOSAL | 0 refills | Status: DC

## 2021-03-26 MED ORDER — FOLIC ACID 1 MG PO TABS: 1.0000 mg | ORAL_TABLET | Freq: Every day | ORAL | Status: DC

## 2021-03-26 MED ORDER — METOPROLOL TARTRATE 25 MG PO TABS: 12.5000 mg | ORAL_TABLET | Freq: Two times a day (BID) | ORAL | 0 refills | Status: DC

## 2021-03-26 MED ORDER — SEVELAMER CARBONATE 800 MG PO TABS: 1600.0000 mg | ORAL_TABLET | Freq: Three times a day (TID) | ORAL | Status: DC

## 2021-03-26 MED ORDER — MELATONIN 3 MG PO TABS
3.0000 mg | ORAL_TABLET | Freq: Every evening | ORAL | Status: DC | PRN
  Administered 2021-03-26: 3 mg via ORAL
  Filled 2021-03-26: qty 1

## 2021-03-26 NOTE — Discharge Summary (Addendum)
Discharge Summary  GARNETT NUNZIATA YJE:[id number] DOB: March 04, 1941  PCP: Maryella Shivers, MD  Admit date: 03/15/2021 Discharge date: 03/26/2021  Time spent: 44mns, more than 50% time spent on coordination of care.   Recommendations for Outpatient Follow-up:  F/u with SNF MD  for hospital discharge follow up, repeat cbc/bmp at follow up F/u with nephrology, continue HD MWF F/u with cardiology/afib clinic  F/u with general surgery/IR F/u with urology , Dr MCain Sieveurology to decide on duration of foley  F/u with [facility] if continued concern for GI bleed Recommend Palliative care to continue follow patient at SNF  High risk of readmission : gallbladder remains distended , FBOT+ currently does not want colonoscopy    Discharge Diagnoses:  Active Hospital Problems   Diagnosis Date Noted   Acute cholecystitis 063/[id number]  Complicated UTI (urinary tract infection)     Resolved Hospital Problems  No resolved problems to display.    Discharge Condition: stable  Diet recommendation:   Diet recommendations: Dysphagia 2 (fine chop);Thin liquid Liquids provided via: Cup;Straw Medication Administration: Crushed with puree Supervision: Staff to assist with self feeding Compensations: Minimize environmental distractions;Slow rate;Small sips/bites Postural Changes and/or Swallow Maneuvers: Seated upright 90 degrees;Upright 30-60 min after meal  Speech to continue monitor patient   Filed Weights   03/24/21 2030 03/25/21 0028 03/25/21 1028  Weight: 69 kg 68.2 kg 68.2 kg    History of present illness: ( per admitting MD Dr ZRoosevelt Locks Chief Complaint: BBeulah Gandyhurts   HPI: TCOYE DAWOODis a 81y.o. male with medical history significant of ESRD on HD MWF, aortic stenosis status post TAVR 2021, nonobstructive CAD, Parkinson's disease, PVD on Plavix, s/p left BKA, HTN, IIDM, HLD,  came with recurrent right upper quadrant abdominal pain.   Symptoms started last night, woke up with 10/10  cramping-like RUQ abdominal pain, nonradiating, associated with nauseous.  Episodic, each episode last 30 to 40 minutes, used some OTC pain medications with minimum help.  Overnight, to throw up of stomach content, could not tell the color of the vomitus given the poor eyesight at baseline.  No diarrhea, denied any fever or chills.  Denies any chest pain, no shortness of breath.  Denied any such abdominal pain in the past.   ED Course: Afebrile, no tachycardia.   RUQ ultrasound showed cholelithiasis, LFTs within normal limits.  WBC 19.1, creatinine 7.2, BUN 28, K4.5.   Patient continues to experience severe RUQ abdominal pain, fentanyl x2 given.   Hospital Course:  Principal Problem:   Acute cholecystitis Active Problems:   Complicated UTI (urinary tract infection)   Severe sepsis from pyocystis/acute cholecystitis , presents on admission --cultures unrevealing -ID Dr VGale Journeyinput  appreciated , was on IV abx (zosyn), plan for omnicef renal dosing until 1/25   Acute urinary retention/hydronephrosis/complicated uti -was on IV abx (zosyn)- seen by ID ,change to Po abx omnicef, last dose on 1/25 -need to keep foley in ( 154ffoley , last placed on 1/20 /2023) -follow up with urology Dr MaCain Sieveor foley management/ removal      cholelithiasis/ suspect early acute cholecystitis with clinical Murphy sign positive on admission -He presents with Acute RUQ abdominal pain with leucocytosis --was on IV abx (zosyn)- seen by ID ,change to Po abx omnicef -GS consult/ IR consulted, s/p  percutaneous gallbladder drain on 1/12, drain removed on 1/17 -hgb dropped without overt external bleed, reports intermittent hiccups, repeat ct ab/pel on 1/19 showed distended gallbladder with dependent calculi and additional  intermediate attenuation material within gallbladder lumen likely representing blood/hemorrhage -discussed with IR who do not plan for intervention unless patient develop ab  pain/leukocytosis/fever, IR contact info placed on AVS ([phone]) -f/u with general surgery Dr Ninfa Linden 05/09/2021 -on 1/22, patient denies ab pain, there is no fever, he desired to return to snf, discussed with daughter over the phone, will proceed to discharge     RLL PNA vs atelectasis -CT scan on 1/13 suggestive of perhaps a Right lower lobe PNA vs atelectasis, -he did vomit initially on admission, there was  a concern of aspiration pneumonia -he is  on abx already- --Repeat CT on 1/19 showed "Small BILATERAL pleural effusions and atelectasis greater on RIGHT" -- continue pulmonary toilet, he is on room air, I did not hear any cough  -he is seen by speech, currently on dys2/thin liquid   Macrocytic anemia -Drop off hemoglobin without overt external bleed -Per chart review she is status post colonoscopy in 11/2019 which showed several polyps status post removal and internal hemorrhoids otherwise unremarkable colonoscopy -Cardiology recommend hold Eliquis for now -FOBT+  -s/p EGD showed " EGD showed possible mild candidiasis and gastritis.  No evidence of bleeding or ulcer disease"  , family does not want to pursue with colonoscopy for now -Add nystatin swish and swallow for 5 days -Protonix 40 mg once a day -family do not desire for colonoscopy currently -need to monitor hgb, contact [facility] if overt bleeding or further drop of hgb    A fib with RVR, new diagnosis this hospitalization -dig x 1 and brief amiodarone , currently only on eliquis, he is back to sinus rhyrhm -cards consult much appreciated, he may not need long term antiarrythmic meds or anticoagulation -Eliquis discontinued per cardiology recommendation due to concern of acute anemia -cards signed off on 1/20 -discharged on low dose lopressor with holding parameters - he is to follow up with cards /afib clinic    PAD s/p left BKA, on plavix prior to admission, this has been on hold since admission, resumed at  discharge   HTN -home meds coreg/imdur held since admission due to  hypotension -blood pressure improved, discharged on low dose lopressor with holding parameters    ESRD on HD MWF -HD management per nephrology   Parkinson's disease -continue Sinemet    Diabetic neuropathy -Continue gabapentin    Noninsulin-dependent type 2 diabetes/diabetic neuropathy --Appear has been diet controlled, recent A1c 7%    Legally blind   FTT, long term care at SNF, will benefit from palliative care following at skilled nursing facility      Body mass index is 25.02 kg/m.[name]  Consultants:  General surgery IR ID Cardiology Urology   Procedures:  Gallbladder drain placement and removal Picc line placement and removal Foley placement    Discharge Exam: BP 130/63 (BP Location: Right Arm)    Pulse 77    Temp 98.1 F (36.7 C) (Oral)    Resp 19    Ht 5' 5"  (1.651 m)    Wt 68.2 kg    SpO2 96%    BMI 25.02 kg/m   General: NAD, chronically ill appearing, aaox3, legally blind , left BKA Cardiovascular: RRR Respiratory: normal respiratory effort Ab: soft, nontender, + bs  Discharge Instructions You were cared for by a hospitalist during your hospital stay. If you have any questions about your discharge medications or the care you received while you were in the hospital after you are discharged, you can call the unit and asked  to speak with the hospitalist on call if the hospitalist that took care of you is not available. Once you are discharged, your primary care physician will handle any further medical issues. Please note that NO REFILLS for any discharge medications will be authorized once you are discharged, as it is imperative that you return to your primary care physician (or establish a relationship with a primary care physician if you do not have one) for your aftercare needs so that they can reassess your need for medications and monitor your lab values.  Discharge Instructions      Diet general   Complete by: As directed    Diet recommendations: Dysphagia 2 (fine chop);Thin liquid Liquids provided via: Cup;Straw Medication Administration: Crushed with puree Supervision: Staff to assist with self feeding Compensations: Minimize environmental distractions;Slow rate;Small sips/bites Postural Changes and/or Swallow Maneuvers: Seated upright 90 degrees;Upright 30-60 min after meal   Increase activity slowly   Complete by: As directed    Increase activity slowly   Complete by: As directed       Allergies as of 03/26/2021   No Known Allergies      Medication List     STOP taking these medications    aspirin EC 81 MG tablet   Auryxia 1 GM 210 MG(Fe) tablet Generic drug: ferric citrate   carvedilol 3.125 MG tablet Commonly known as: COREG   isosorbide mononitrate 30 MG 24 hr tablet Commonly known as: IMDUR   oxyCODONE-acetaminophen 5-325 MG tablet Commonly known as: Percocet       TAKE these medications    acetaminophen 500 MG tablet Commonly known as: TYLENOL Take 1,000 mg by mouth every 6 (six) hours as needed for moderate pain or headache.   atorvastatin 10 MG tablet Commonly known as: LIPITOR Take 1 tablet (10 mg total) by mouth in the morning.   blood glucose meter kit and supplies Kit Dispense based on patient and insurance preference. Use up to four times daily as directed. (FOR ICD-9 250.00, 250.01). For QAC - HS accuchecks.   brimonidine 0.15 % ophthalmic solution Commonly known as: ALPHAGAN Place 1 drop into the right eye 3 (three) times daily.   carbidopa-levodopa 25-100 MG tablet Commonly known as: Sinemet Take 1 tablet by mouth 3 (three) times daily. What changed: additional instructions   cefdinir 300 MG capsule Commonly known as: OMNICEF Take 1 capsule (300 mg total) by mouth every Monday, Wednesday, and Friday at 6 PM for 5 days.   clopidogrel 75 MG tablet Commonly known as: PLAVIX Take 1 tablet (75 mg total) by mouth  daily with breakfast.   Dialyvite 800 0.8 MG Tabs Take 1 tablet by mouth in the morning.   docusate sodium 100 MG capsule Commonly known as: COLACE Take 100 mg by mouth 2 (two) times daily.   dorzolamide-timolol 22.3-6.8 MG/ML ophthalmic solution Commonly known as: COSOPT Place 1 drop into the right eye 2 (two) times daily.   gabapentin 100 MG capsule Commonly known as: Neurontin Take 1 capsule (100 mg total) by mouth daily.   HECTOROL IV Doxercalciferol (Hectorol) Notes to patient: As Before, As Directed   Melatonin 5 MG Caps Take 5 mg by mouth at bedtime.   metoprolol tartrate 25 MG tablet Commonly known as: LOPRESSOR Take 0.5 tablets (12.5 mg total) by mouth 2 (two) times daily. Hold if sbp less than 100   MIRCERA IJ Mircera Notes to patient: As Before, As Directed   nystatin 100000 UNIT/ML suspension Commonly known as: MYCOSTATIN Take  5 mLs (500,000 Units total) by mouth 4 (four) times daily.   ondansetron 4 MG tablet Commonly known as: Zofran Take 1 tablet (4 mg total) by mouth daily as needed for nausea or vomiting.   OneTouch Delica Plus [id number] Misc Notes to patient: As Before, As Directed   OneTouch Verio test strip Generic drug: glucose blood USE AS DIRECTED TO TEST FOUR TIMES A DAY   pantoprazole 40 MG tablet Commonly known as: PROTONIX Take 1 tablet (40 mg total) by mouth daily at 12 noon.   pilocarpine 4 % ophthalmic solution Commonly known as: PILOCAR Place 1 drop into the right eye 4 (four) times daily.   polyethylene glycol 17 g packet Commonly known as: MIRALAX / GLYCOLAX Take 17 g by mouth daily. What changed: when to take this   Rocklatan 0.02-0.005 % Soln Generic drug: Netarsudil-Latanoprost Place 1 drop into the right eye at bedtime.   senna-docusate 8.6-50 MG tablet Commonly known as: Senokot-S Take 1 tablet by mouth at bedtime.   sevelamer carbonate 800 MG tablet Commonly known as: RENVELA Take 2 tablets (1,600 mg total)  by mouth 3 (three) times daily with meals.       No Known Allergies  Follow-up Information     Maryella Shivers, MD. Call .   Specialty: Family Medicine Why: As needed Contact information: [address] [place] [address] [phone]         Coralie Keens, MD Follow up on 05/09/2021.   Specialty: General Surgery Why: 940am. Please arrive 30 minutes prior to your appointment for paperwork. Please bring a copy of your photo ID and insurance card. Contact information: [address] [place] [address] [phone]         Criselda Peaches, MD Follow up.   Specialties: Interventional Radiology, Radiology Why: For your Cholecystostomy Drain Contact information: [address] [place] [address] [phone]         [place] Follow up on 03/29/2021.   Specialty: Cardiology Why: 3:30 PM Contact information: [address] [id number] [place] [address] [phone]        Almyra Deforest, [place] Follow up on 04/18/2021.   Specialties: Cardiology, Radiology Why: 3:35 PM Contact information: [address] [place] [address] [phone]         Vira Agar, MD Follow up in 2 week(s).   Specialty: Urology Why: for urinary retention, hydronephrosis, foley management Contact information: [address], [place] [address] [place] [address] [phone]                  The results of significant diagnostics from this hospitalization (including imaging, microbiology, ancillary and laboratory) are listed below for reference.    Significant Diagnostic Studies: CT ABDOMEN PELVIS WO CONTRAST  Result Date: 03/23/2021 CLINICAL DATA:  Postoperative RIGHT upper quadrant pain, headache ups, fall in hemoglobin, post percutaneous cholecystostomy on 03/16/2021 with removal of biliary drain on 03/21/2021 EXAM: CT ABDOMEN AND PELVIS WITHOUT CONTRAST  TECHNIQUE: Multidetector CT imaging of the abdomen and pelvis was performed following the standard protocol without IV contrast. RADIATION DOSE REDUCTION: This exam was performed according to the departmental dose-optimization program which includes automated exposure control, adjustment of the mA and/or kV according to patient size and/or use of iterative reconstruction technique. COMPARISON:  03/17/2021 FINDINGS: Lower chest: Small BILATERAL pleural effusions, RIGHT greater than LEFT. Significant atelectasis of RIGHT lower lobe. Minimal compressive atelectasis  posterior LEFT lower lobe. Coronary arterial calcifications noted with evidence of TAVR. Hepatobiliary: Interval removal of cholecystostomy tube. Dependent calculi within gallbladder with additional new intermediate attenuation material within gallbladder lumen likely representing blood. Gallbladder remains distended. Liver unremarkable. Pancreas: Normal appearance Spleen: Normal appearance.  Small splenule adjacent to spleen. Adrenals/Urinary Tract: 2.5 x 2.1 cm diameter homogeneous LEFT adrenal mass, 32 HU attenuation on noncontrast imaging, probable adrenal adenoma. RIGHT adrenal gland unremarkable. No renal mass or hydronephrosis. Foley catheter decompresses urinary bladder. No ureteral dilatation. Stomach/Bowel: Normal appendix. Stomach and bowel loops normal appearance. Vascular/Lymphatic: Atherosclerotic calcifications aorta and iliac arteries. No adenopathy. Reproductive: Seed implants at prostate gland. Other: Small amount of low-attenuation free fluid in pelvis and perihepatic. No free air. Small umbilical hernia containing fat. Musculoskeletal: Bones demineralized. IMPRESSION: Interval removal of cholecystostomy tube. Persistent gallbladder distention with dependent calculi and additional intermediate attenuation material within gallbladder lumen likely representing blood/hemorrhage. Small amount of nonspecific low-attenuation free intraperitoneal  fluid perihepatic and in pelvis. 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 Small BILATERAL pleural effusions and atelectasis greater on RIGHT. Small umbilical hernia containing fat. Aortic Atherosclerosis (ICD10-I70.0). Electronically Signed   By: Lavonia Dana M.D.   On: 03/23/2021 13:34   DG Chest 1 View  Result Date: 03/15/2021 CLINICAL DATA:  Abdominal pain and vomiting. Clinical concern for aspiration. EXAM: CHEST  1 VIEW COMPARISON:  06/24/2020 FINDINGS: Normal sized heart. Interval minimal patchy density at the right lung base. The left lung remains clear. Aortic valve stent. Thoracic spine degenerative changes. Left axillary surgical clip. IMPRESSION: Minimal right basilar atelectasis, pneumonia or aspiration pneumonitis. Electronically Signed   By: Claudie Revering M.D.   On: 03/15/2021 14:07   CT ABDOMEN PELVIS W CONTRAST  Result Date: 03/17/2021 CLINICAL DATA:  Acute generalized abdominal pain. EXAM: CT ABDOMEN AND PELVIS WITH CONTRAST TECHNIQUE: Multidetector CT imaging of the abdomen and pelvis was performed using the standard protocol following bolus administration of intravenous contrast. RADIATION DOSE REDUCTION: This exam was performed according to the departmental dose-optimization program which includes automated exposure control, adjustment of the mA and/or kV according to patient size and/or use of iterative reconstruction technique. CONTRAST:  19m OMNIPAQUE IOHEXOL 300 MG/ML  SOLN COMPARISON:  March 15, 2021. FINDINGS: Lower chest: Right lower lobe pneumonia or atelectasis is noted. Hepatobiliary: Interval placement of percutaneous cholecystostomy tube is noted. Continued gallbladder distention and cholelithiasis is noted. No biliary dilatation is noted. The liver is unremarkable. Pancreas: Unremarkable. No pancreatic ductal dilatation or surrounding inflammatory changes. Spleen: Normal in size without focal abnormality. Adrenals/Urinary Tract: Stable 2.6 cm left adrenal nodule is noted.  Stable right adrenal gland enlargement is noted. Mild bilateral renal atrophy is noted. No nephrolithiasis is noted. Moderate right hydroureteronephrosis is noted without obstructing calculus. Stable dilatation of left renal pelvis is noted without calyceal dilatation. Mild left ureteral dilatation is noted. No obstructing calculus is noted. Mild urinary bladder distention is noted. Stomach/Bowel: Stomach is within normal limits. Appendix appears normal. No evidence of bowel wall thickening, distention, or inflammatory changes. Vascular/Lymphatic: Aortic atherosclerosis. No enlarged abdominal or pelvic lymph nodes. Reproductive: Status post prostatic brachytherapy seed placement. Other: No abdominal wall hernia or abnormality. No abdominopelvic ascites. Musculoskeletal: No acute or significant osseous findings. IMPRESSION: Interval development of right lower lobe opacity is noted concerning for pneumonia or atelectasis. Interval placement of percutaneous cholecystostomy tube in grossly good position. Stable gallbladder distension and cholelithiasis is noted. Grossly stable moderate right hydroureteronephrosis is noted as well as prominent left renal pelvis  and mild distal left ureteral dilatation, without evidence of obstructing calculus. Mild urinary bladder distention is noted. 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 Aortic Atherosclerosis (ICD10-I70.0). Electronically Signed   By: Marijo Conception M.D.   On: 03/17/2021 09:41   US Abdomen Limited  Result Date: 03/15/2021 CLINICAL DATA:  Right upper quadrant pain EXAM: ULTRASOUND ABDOMEN LIMITED RIGHT UPPER QUADRANT COMPARISON:  None. FINDINGS: Gallbladder: Distended with sludge and calculi measuring up to 4 mm. No wall thickening visualized. No pericholecystic fluid. No sonographic Murphy sign noted by sonographer. Common bile duct: Diameter: 3 mm, normal Liver: Left lobe of liver is poorly visualized due  to bowel gas. No focal lesion identified. Within normal limits in parenchymal echogenicity. Portal vein is patent on color Doppler imaging with normal direction of blood flow towards the liver. Other: None. IMPRESSION: Distended gallbladder with sludge and calculi but no additional sonographic evidence of acute cholecystitis. Electronically Signed   By: Macy Mis M.D.   On: 03/15/2021 11:40   IR Perc Cholecystostomy  Result Date: 03/16/2021 INDICATION: 80 year old male with right upper quadrant pain and imaging findings concerning for acute calculus cholecystitis. A nuclear medicine HIDA scan was ordered to confirm, however patient's clinical status has deteriorated overnight with significant increase in leukocytosis, new oxygen requirement and increasing pain. Therefore, we are proceeding directly with percutaneous transhepatic cholecystostomy tube placement. EXAM: CHOLECYSTOSTOMY MEDICATIONS: In patient currently on intravenous Zosyn. No additional antibiotic prophylaxis administered. ANESTHESIA/SEDATION: Moderate (conscious) sedation was employed during this procedure. A total of Versed 0.5 mg and Fentanyl 25 mcg and 0.5 mg Dilaudid was administered intravenously. Moderate Sedation Time: 10 minutes. The patient's level of consciousness and vital signs were monitored continuously by radiology nursing throughout the procedure under my direct supervision. FLUOROSCOPY TIME:  Fluoroscopy Time: 0 minutes 36 seconds (2 mGy). COMPLICATIONS: None immediate. PROCEDURE: Informed written consent was obtained from the patient after a thorough discussion of the procedural risks, benefits and alternatives. All questions were addressed. Maximal Sterile Barrier Technique was utilized including caps, mask, sterile gowns, sterile gloves, sterile drape, hand hygiene and skin antiseptic. A timeout was performed prior to the initiation of the procedure. The right upper quadrant was interrogated with ultrasound. The  gallbladder is markedly distended. A suitable skin entry site was selected and marked. The overlying skin was sterilely prepped and draped in the standard fashion using chlorhexidine skin prep. Local anesthesia was attained by infiltration with 1% lidocaine. A small dermatotomy was made. Under real-time ultrasound guidance, a 21 gauge Accustick needle was advanced along a short transhepatic course and into the gallbladder lumen. Images were obtained and stored for the medical record. A 0.018 wire was advanced in the gallbladder lumen. The needle was exchanged for the Accustick transitional dilator which was advanced over the wire and into the gallbladder. Aspiration yields dark black bile. A sample was sent for Gram stain and culture. A 0.035 wire was coiled in the gallbladder lumen. The skin tract was dilated to 10 [place]. A Cook 10.2 [place] all-purpose drainage catheter was advanced over the wire and formed in the gallbladder. The catheter was connected to gravity bag drainage and secured to the skin with 0 Prolene suture. Overall, the patient tolerated the procedure well. IMPRESSION: Successful placement of 10 French percutaneous transhepatic cholecystostomy tube for the indication of acute calculus cholecystitis. Electronically Signed   By: Jacqulynn Cadet M.D.   On: 03/16/2021 14:07   DG CHEST PORT 1 VIEW  Result Date: 03/17/2021 CLINICAL  DATA:  Chest abdominal pain.  Aspiration in airway. EXAM: PORTABLE CHEST 1 VIEW COMPARISON:  AP chest 03/16/2021 FINDINGS: The patient is mildly rightward rotated. Within this limitation, cardiac silhouette and mediastinal contours are grossly unchanged and within normal limits. Calcification is seen within the aortic arch. 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 The lungs appear clear. No pleural effusion or pneumothorax. No acute skeletal abnormality. IMPRESSION: No acute lung process. Electronically Signed   By: Yvonne Kendall M.D.   On: 03/17/2021 09:21   DG CHEST  PORT 1 VIEW  Result Date: 03/16/2021 CLINICAL DATA:  Hypoxia EXAM: PORTABLE CHEST 1 VIEW COMPARISON:  Chest x-ray dated March 15, 2021 FINDINGS: Cardiac and mediastinal contours are unchanged within normal limits. Prior transcatheter aortic valve replacement. Interval resolution of right basilar lung opacity, favor resolved atelectasis. No focal consolidation. No large pleural effusion pneumothorax. IMPRESSION: Interval resolution of right basilar lung opacity, favor resolved atelectasis. Electronically Signed   By: Yetta Glassman M.D.   On: 03/16/2021 07:46   CT Renal Stone Study  Result Date: 03/15/2021 CLINICAL DATA:  Right abdominal pain. EXAM: CT ABDOMEN AND PELVIS WITHOUT CONTRAST TECHNIQUE: Multidetector CT imaging of the abdomen and pelvis was performed following the standard protocol without IV contrast. RADIATION DOSE REDUCTION: This exam was performed according to the departmental dose-optimization program which includes automated exposure control, adjustment of the mA and/or kV according to patient size and/or use of iterative reconstruction technique. COMPARISON:  CTA abdomen and pelvis 12/01/2019 FINDINGS: Lower chest: Trace right pleural fluid is new from prior. Unchanged mild bibasilar bronchiectasis. Mild dependent right lower lobe ground-glass likely subsegmental atelectasis. A partially visualized ascending aortic endograft. Dense coronary artery calcifications are seen. No pericardial effusion. Lack of intra-articular fluid limits evaluation of the abdominal and pelvic organ parenchyma. The following findings are made within this limitation. Hepatobiliary: Smooth liver contours. No gross liver lesion is seen. The gallbladder is again moderately distended with numerous layering small gallstones, similar to prior. No definite gallbladder wall thickening. Pancreas: Unremarkable. No pancreatic ductal dilatation or surrounding inflammatory changes. Spleen: Normal in size without focal  abnormality. Adrenals/Urinary Tract: Left adrenal 2.6 cm nodule is unchanged from 11/26/2019 with internal density of 15 Hounsfield units. Right adrenal 15 mm nodule is unchanged in size with internal density of 5 Hounsfield units suggesting a benign lipid rich adenoma. These both again are likely benign. Mild left-greater-than-right renal pelvis vascular calcifications. Mild bilateral renal cortical thinning is again seen. Bilateral moderate extrarenal pelves are again noted. There is again mild-to-moderate dilatation of the bilateral ureters diffusely down to the level of the ureteropelvic junction likely chronic. No obstructing renal stone is seen. No hydronephrosis. Within the limitations of lack of IV contrast, no contour deforming renal mass. The urinary bladder is grossly unremarkable. Stomach/Bowel: No bowel wall thickening. The terminal ileum is unremarkable. The appendix is within normal limits. No dilated loops of bowel to indicate bowel obstruction. Vascular/Lymphatic: No abdominal aortic aneurysm. Dense vascular calcifications. No enlarged abdominal or pelvic lymph nodes. 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 Other: Small fat containing umbilical hernia, unchanged. Unchanged small fat containing left inguinal hernia. No free air or free fluid. Musculoskeletal: Mild multilevel degenerative disc changes. Old healed lateral right eleventh rib fracture unchanged. IMPRESSION: 1. No nephroureterolithiasis or hydronephrosis. 2. Cholelithiasis. 3. 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 4. Small fat containing umbilical and left inguinal hernias. 5. Trace right pleural effusion with mild subsegmental atelectasis. 6.  Aortic Atherosclerosis (ICD10-I70.0).  Electronically Signed   By: Yvonne Kendall   On: 03/15/2021 11:14   ECHOCARDIOGRAM LIMITED  Result Date: 03/17/2021    ECHOCARDIOGRAM LIMITED REPORT   Patient Name:   RON BESKE Date of Exam: 03/17/2021 Medical Rec #:  [id number]       Height:       65.0 in Accession #:    [id number]      Weight:       143.3 lb Date of Birth:  May 27, 1940       BSA:          1.717 m Patient Age:    25 years        BP:           116/50 mmHg Patient Gender: M               HR:           114 bpm. Exam Location:  Inpatient Procedure: Cardiac Doppler, Color Doppler and Limited Echo                         STAT ECHO Reported to: Dr Gwyndolyn Kaufman on 03/17/2021 5:50:00 PM. Indications:    Hypotension  History:        Patient has no prior history of Echocardiogram examinations,                 most recent 01/05/2021.                 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  Sonographer:    Clayton Lefort RDCS (AE) Referring Phys: [id number] Darreld Mclean  Sonographer Comments: Image acquisition challenging due to respiratory motion. IMPRESSIONS  1. Left ventricular ejection fraction, by estimation, is 60 to 65%. The left ventricle has normal function. There is moderate concentric left ventricular hypertrophy.  2. Right ventricular systolic function is normal. The right ventricular size is normal.  3. The mitral valve is degenerative. Severe mitral annular calcification.  4. 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 Echo findings are consistent with normal structure and function of the aortic valve prosthesis. Aortic valve mean gradient measures 5.0 mmHg. Aortic valve Vmax measures 1.40 m/s. DI 0.8. There is no paravalvular leak.  5. The inferior vena cava is normal in size with <50% respiratory variability, suggesting right atrial pressure of 8 mmHg. Comparison(s): Compared to prior TTE on 01/2021, there is no significant change. TAVR valve continues to be well seated with current gradient 5mHg (previously 627mg). FINDINGS  Left Ventricle: Left ventricular  ejection fraction, by estimation, is 60 to 65%. The left ventricle has normal function. The left ventricular internal cavity size was normal in size. There is moderate concentric left ventricular hypertrophy. Right Ventricle: The right ventricular size is normal. Right ventricular systolic function is normal. Pericardium: There is no evidence of pericardial effusion. Mitral Valve: The mitral valve is degenerative in appearance. There is moderate thickening of the mitral valve leaflet(s). There is moderate calcification of the mitral valve leaflet(s). Severe mitral annular calcification. Tricuspid Valve: The tricuspid valve is normal in structure. Tricuspid valve regurgitation is trivial. Aortic Valve: DI 0.8. No PVL. The aortic valve has been repaired/replaced. Aortic valve mean gradient measures 5.0 mmHg. Aortic valve  peak gradient measures 7.8 mmHg. Aortic valve area, by VTI measures 2.67 cm. 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 Echo findings are consistent with normal structure and function of the aortic valve prosthesis. Pulmonic Valve: The pulmonic valve was not well visualized. Pulmonic valve regurgitation is trivial. Aorta: The aortic root and ascending aorta are structurally normal, with no evidence of dilitation. Venous: The inferior vena cava is normal in size with less than 50% respiratory variability, suggesting right atrial pressure of 8 mmHg. LEFT VENTRICLE PLAX 2D LVIDd:         3.90 cm LVIDs:         3.00 cm LV PW:         1.30 cm LV IVS:        1.20 cm LVOT diam:     2.00 cm LV SV:         61 LV SV Index:   36 LVOT Area:     3.14 cm  LEFT ATRIUM           Index LA diam:      3.40 cm 1.98 cm/m LA Vol (A4C): 20.0 ml 11.65 ml/m  AORTIC VALVE AV Area (Vmax):    2.51 cm AV Area (Vmean):   2.53 cm AV Area (VTI):     2.67 cm AV Vmax:           140.00 cm/s AV Vmean:          104.000 cm/s AV VTI:            0.228 m AV Peak Grad:      7.8 mmHg AV Mean  Grad:      5.0 mmHg LVOT Vmax:         112.00 cm/s LVOT Vmean:        83.900 cm/s LVOT VTI:          0.194 m LVOT/AV VTI ratio: 0.85  AORTA Ao Root diam: 2.50 cm Ao Asc diam:  2.60 cm  SHUNTS Systemic VTI:  0.19 m Systemic Diam: 2.00 cm Gwyndolyn Kaufman MD Electronically signed by Gwyndolyn Kaufman MD Signature Date/Time: 03/17/2021/6:28:04 PM    Final    [place] EKG SITE RITE  Result Date: 03/19/2021 If Site Rite image not attached, placement could not be confirmed due to current cardiac rhythm.   Microbiology: Recent Results (from the past 240 hour(s))  Culture, blood (routine x 2)     Status: None   Collection Time: 03/17/21  3:07 AM   Specimen: BLOOD RIGHT HAND  Result Value Ref Range Status   Specimen Description BLOOD RIGHT HAND  Final   Special Requests AEROBIC BOTTLE ONLY Blood Culture adequate volume  Final   Culture   Final    NO GROWTH 5 DAYS Performed at [facility], [address] [place], [place] [address]    Report Status 03/22/2021 FINAL  Final  Culture, blood (routine x 2)     Status: None   Collection Time: 03/17/21  3:17 AM   Specimen: BLOOD RIGHT HAND  Result Value Ref Range Status   Specimen Description BLOOD RIGHT HAND  Final   Special Requests   Final    AEROBIC BOTTLE ONLY Blood Culture results may not be optimal due to an inadequate volume of blood received in culture bottles   Culture   Final    NO GROWTH 5 DAYS Performed at [facility], [place],  [place] [address]    Report Status 03/22/2021 FINAL  Final  Urine Culture     Status: None   Collection Time: 03/18/21 10:30 AM   Specimen: In/Out Cath Urine  Result Value Ref Range Status   Specimen Description IN/OUT CATH URINE  Final   Special Requests NONE  Final   Culture   Final    NO GROWTH Performed at [facility], [place] [address], [place], [place] [address]    Report Status 03/19/2021 FINAL  Final  Resp Panel by RT-PCR (Flu A&B, Covid) Nasopharyngeal Swab     Status:  None   Collection Time: 03/22/21 12:42 PM   Specimen: Nasopharyngeal Swab; Nasopharyngeal(NP) swabs in vial transport medium  Result Value Ref Range Status   SARS Coronavirus 2 by RT PCR NEGATIVE NEGATIVE Final    Comment: (NOTE) SARS-CoV-2 target nucleic acids are NOT DETECTED.  The SARS-CoV-2 RNA is generally detectable in upper respiratory specimens during the acute phase of infection. The lowest concentration of SARS-CoV-2 viral copies this assay can detect is 138 copies/mL. A negative result does not preclude SARS-Cov-2 infection and should not be used as the sole basis for treatment or other patient management decisions. A negative result may occur with  improper specimen collection/handling, submission of specimen other than nasopharyngeal swab, presence of viral mutation(s) within the areas targeted by this assay, and inadequate number of viral copies(<138 copies/mL). A negative result must be combined with clinical observations, patient history, and epidemiological information. The expected result is Negative.  Fact Sheet for Patients:  EntrepreneurPulse.com.au  Fact Sheet for Healthcare Providers:  IncredibleEmployment.be  This test is no t yet approved or cleared by the [place] FDA and  has been authorized for detection and/or diagnosis of SARS-CoV-2 by FDA under an Emergency Use Authorization (EUA). This EUA will remain  in effect (meaning this test can be used) for the duration of the COVID-19 declaration under Section 564(b)(1) of the Act, 21 U.S.C.section 360bbb-3(b)(1), unless the authorization is terminated  or revoked sooner.       Influenza A by PCR NEGATIVE NEGATIVE Final   Influenza B by PCR NEGATIVE NEGATIVE Final    Comment: (NOTE) The Xpert Xpress SARS-CoV-2/FLU/RSV plus assay is intended as an aid in the diagnosis of influenza from Nasopharyngeal swab specimens and should not be used as a sole basis for  treatment. Nasal washings and aspirates are unacceptable for Xpert Xpress SARS-CoV-2/FLU/RSV testing.  Fact Sheet for Patients: EntrepreneurPulse.com.au  Fact Sheet for Healthcare Providers: IncredibleEmployment.be  This test is not yet approved or cleared by the [place] FDA and has been authorized for detection and/or diagnosis of SARS-CoV-2 by FDA under an Emergency Use Authorization (EUA). This EUA will remain in effect (meaning this test can be used) for the duration of the COVID-19 declaration under Section 564(b)(1) of the Act, 21 U.S.C. section 360bbb-3(b)(1), unless the authorization is terminated or revoked.  Performed at [facility], [place] [address] [place], [place] [address]   Resp Panel by RT-PCR (Flu A&B, Covid) Nasopharyngeal Swab     Status: None   Collection Time: 03/26/21 11:56 AM   Specimen: Nasopharyngeal Swab; Nasopharyngeal(NP) swabs in vial transport medium  Result Value Ref Range Status   SARS Coronavirus 2 by RT PCR NEGATIVE NEGATIVE Final    Comment: (NOTE) SARS-CoV-2 target nucleic acids are NOT DETECTED.  The SARS-CoV-2 RNA is generally detectable in upper respiratory specimens during the acute phase of infection. The lowest concentration of SARS-CoV-2 viral  copies this assay can detect is 138 copies/mL. A negative result does not preclude SARS-Cov-2 infection and should not be used as the sole basis for treatment or other patient management decisions. A negative result may occur with  improper specimen collection/handling, submission of specimen other than nasopharyngeal swab, presence of viral mutation(s) within the areas targeted by this assay, and inadequate number of viral copies(<138 copies/mL). A negative result must be combined with clinical observations, patient history, and epidemiological information. The expected result is Negative.  Fact Sheet for Patients:   EntrepreneurPulse.com.au  Fact Sheet for Healthcare Providers:  IncredibleEmployment.be  This test is no t yet approved or cleared by the [place] FDA and  has been authorized for detection and/or diagnosis of SARS-CoV-2 by FDA under an Emergency Use Authorization (EUA). This EUA will remain  in effect (meaning this test can be used) for the duration of the COVID-19 declaration under Section 564(b)(1) of the Act, 21 U.S.C.section 360bbb-3(b)(1), unless the authorization is terminated  or revoked sooner.       Influenza A by PCR NEGATIVE NEGATIVE Final   Influenza B by PCR NEGATIVE NEGATIVE Final    Comment: (NOTE) The Xpert Xpress SARS-CoV-2/FLU/RSV plus assay is intended as an aid in the diagnosis of influenza from Nasopharyngeal swab specimens and should not be used as a sole basis for treatment. Nasal washings and aspirates are unacceptable for Xpert Xpress SARS-CoV-2/FLU/RSV testing.  Fact Sheet for Patients: EntrepreneurPulse.com.au  Fact Sheet for Healthcare Providers: IncredibleEmployment.be  This test is not yet approved or cleared by the [place] FDA and has been authorized for detection and/or diagnosis of SARS-CoV-2 by FDA under an Emergency Use Authorization (EUA). This EUA will remain in effect (meaning this test can be used) for the duration of the COVID-19 declaration under Section 564(b)(1) of the Act, 21 U.S.C. section 360bbb-3(b)(1), unless the authorization is terminated or revoked.  Performed at [facility], [place] [address], [place], [place] [address]      Labs: Basic Metabolic Panel: Recent Labs  Lab 03/20/21 0600 03/21/21 0840 03/22/21 0639 03/26/21 0346  NA 135 136 136 132*  K 3.2* 3.3* 3.7 4.0  CL 96* 98 99 96*  CO2 24 25 24 26   GLUCOSE 163* 133* 116* 104*  BUN 45* 36* 53* 25*  CREATININE 6.44* 4.34* 5.46* 4.18*  CALCIUM 8.6* 8.3* 8.6* 8.2*  PHOS  4.0 2.5 3.3  --    Liver Function Tests: Recent Labs  Lab 03/20/21 0600 03/21/21 0840 03/22/21 0639 03/26/21 0346  AST  --   --   --  18  ALT  --   --   --  6  ALKPHOS  --   --   --  135*  BILITOT  --   --   --  0.7  PROT  --   --   --  5.3*  ALBUMIN 1.7* 1.6* 1.7* 1.7*   No results for input(s): LIPASE, AMYLASE in the last 168 hours. No results for input(s): AMMONIA in the last 168 hours. CBC: Recent Labs  Lab 03/22/21 0639 03/23/21 0722 03/24/21 0630 03/25/21 0420 03/26/21 0346  WBC 13.3* 11.3* 12.0* 11.5* 12.6*  HGB 8.9* 7.9* 9.1* 9.4* 8.7*  HCT 27.1* 23.8* 25.9* 27.3* 25.1*  MCV 103.8* 101.7* 101.2* 101.5* 101.6*  PLT 324 350 411* 396 403*   Cardiac Enzymes: No results for input(s): CKTOTAL, CKMB, CKMBINDEX, TROPONINI in the last 168 hours. BNP: BNP (last 3 results) No results for input(s): BNP in  the last 8760 hours.  ProBNP (last 3 results) No results for input(s): PROBNP in the last 8760 hours.  CBG: Recent Labs  Lab 03/25/21 1630 03/26/21 0127 03/26/21 0332 03/26/21 0742 03/26/21 1137  GLUCAP 98 95 100* 93 117*       Signed:  Florencia Reasons MD, PhD, FACP  [facility] 03/26/2021, 2:50 PM

## 2021-03-26 NOTE — Progress Notes (Signed)
[facility] Progress Note   Subjective:    Seen and examined patient at bedside. No complaints. Patient is ready to go home. Spoke to SW-patient will be able to return back to facility today. Plan for discharge today.  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, NAD Heart: Normal S1 and S2; No murmurs, gallops, or rubs Lungs: Clear anteriorly and laterally Abdomen: Soft and non-tender Extremities: No edema BLLE Dialysis Access: L AVF (+) Bruit/Thrill    Filed Weights   03/24/21 2030 03/25/21 0028 03/25/21 1028  Weight: 69 kg 68.2 kg 68.2 kg   No intake or output data in the 24 hours ending 03/26/21 1459  Additional Objective Labs: Basic Metabolic Panel: Recent Labs  Lab 03/20/21 0600 03/21/21 0840 03/22/21 0639 03/26/21 0346  NA 135 136 136 132*  K 3.2* 3.3* 3.7 4.0  CL 96* 98 99 96*  CO2 24 25 24 26   GLUCOSE 163* 133* 116* 104*  BUN 45* 36* 53* 25*  CREATININE 6.44* 4.34* 5.46* 4.18*  CALCIUM 8.6* 8.3* 8.6* 8.2*  PHOS 4.0 2.5 3.3  --    Liver Function Tests: Recent Labs  Lab 03/21/21 0840 03/22/21 0639 03/26/21 0346  AST  --   --  18  ALT  --   --  6  ALKPHOS  --   --  135*  BILITOT  --   --  0.7  PROT  --   --  5.3*  ALBUMIN 1.6* 1.7* 1.7*   No results for input(s): LIPASE, AMYLASE in the last 168 hours. CBC: Recent Labs  Lab 03/22/21 0639 03/23/21 0722 03/24/21 0630 03/25/21 0420 03/26/21 0346  WBC 13.3* 11.3* 12.0* 11.5* 12.6*  HGB 8.9* 7.9* 9.1* 9.4* 8.7*  HCT 27.1* 23.8* 25.9* 27.3* 25.1*  MCV 103.8* 101.7* 101.2* 101.5* 101.6*  PLT 324 350 411* 396 403*   Blood Culture    Component Value Date/Time   SDES IN/OUT CATH URINE  03/18/2021 1030   [place] 03/18/2021 1030   CULT  03/18/2021 1030    NO GROWTH Performed at [facility] [address], [place], [place] [address]    REPTSTATUS 03/19/2021 FINAL 03/18/2021 1030    Cardiac Enzymes: No results for input(s): CKTOTAL, CKMB, CKMBINDEX, TROPONINI in the last 168 hours. CBG: Recent Labs  Lab 03/25/21 1630 03/26/21 0127 03/26/21 0332 03/26/21 0742 03/26/21 1137  GLUCAP 98 95 100* 93 117*   Iron Studies:  Recent Labs    03/24/21 0730  IRON 17*  TIBC 101*  FERRITIN 2,489*   Lab Results  Component Value Date   INR 1.0 01/14/2020   INR 1.0 11/26/2019   INR 1.11 03/25/2018   Studies/Results: No results found.  Medications:   (feeding supplement) PROSource Plus  30 mL Oral BID BM   brimonidine  1 drop Right Eye TID   carbidopa-levodopa  1 tablet Oral TID   cefdinir  300 mg Oral Q M,W,F-1800   Chlorhexidine Gluconate Cloth  6 each Topical Q0600   cinacalcet  60 mg Oral Q M,W,F-HD   clopidogrel  75  mg Oral Daily   darbepoetin (ARANESP) injection - DIALYSIS  40 mcg Intravenous Q Mon-HD   doxercalciferol  2 mcg Intravenous Q M,W,F-HD   folic acid  1 mg Oral Daily   guaiFENesin  600 mg Oral BID   insulin aspart  0-6 Units Subcutaneous Q4H   latanoprost  1 drop Right Eye QHS   neomycin-bacitracin-polymyxin   Topical Daily   nystatin  5 mL Oral QID   pantoprazole  40 mg Oral Daily   pilocarpine  1 drop Right Eye QID   polyethylene glycol  17 g Oral Daily   senna-docusate  1 tablet Oral BID   sevelamer carbonate  1,600 mg Oral TID WC   sodium chloride flush  10-40 mL Intracatheter Q12H    Dialysis Orders: [facility] MWF  4h  400/600  66kg 2/2 bath  P2  AVF  Hep none  - no esa/ Fe  - sensipar 60 mg po tiw  - hectorol 3 ug tiw IV  Assessment/Plan: Acute Cholelithiasis: Per primary. S/P perc chole tube by IR 1/12. Tube pulled out 1/17.  Surgery signed off. Per primary/IR  New onset atrial fib w/ RVR - converted to sinus 1/14  seen by cardiology. On Eliquis for now.  Acute urinary retention/UTI/hydronephrosis. Urology consulted. Foley in place. On IV Zosyn >>now PO Omnicef per ID.  Heme (+) Stool-S/p EGD today-showed possible mild candidiasis and gastritis.  No evidence of bleeding or ulcer disease. Reviewed previous notes: family does not want to pursue Colonoscopy at this time. 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 Back on schedule. Plan for patient to return to SNF-can resume HD 1/23 in outpatient. BP/volume  - Soft BP's.  Holding home BP meds. Well below EDW. Minimal UF with HD.   Anemia  - Hgb trending downward. Started ESA. Got Aranesp 40 on 1/16. Hgb now 8.7. 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  Changed to Renvela binders. Lower VDRA dose. Continue sensipar.   Nutrition - Albumin <2. Protein supps ordered. Renal diet.  Parkinson's disease-per primary DMT2-per primary H/O TAVR DNR Dispo-Okay for discharge from renal standpoint. Spoke to SW-patient can return back to SNF today. Will resume HD 1/23 in outpatient.   Elijah Poet, NP [facility] 03/26/2021,2:59 PM  LOS: 11 days

## 2021-03-26 NOTE — TOC Transition Note (Signed)
Transition of Care [facility]) - CM/SW Discharge Note   Patient Details  Name: Elijah White MRN: [id number] Date of Birth: Jan 13, 1941  [facility]) CM/SW Contact:  Bary Castilla, LCSW Phone Number: [phone] 03/26/2021, 1:29 PM   Clinical Narrative:     Patient will DC to:?[facility] date:?03/26/2021 Family notified:?[name] Transport by: Corey Harold   Per MD patient ready for DC to [facility]. RN, patient, patient's family, and facility notified of DC. Discharge Summary sent to facility. RN given number for report  [phone] room 215. DC packet on chart. Ambulance transport requested for patient.   CSW signing off.   [place], [place] [phone]   Final next level of care: Skilled Nursing Facility Barriers to Discharge: Barriers Resolved   Patient Goals and CMS Choice Patient states their goals for this hospitalization and ongoing recovery are:: SNF CMS Medicare.gov Compare Post Acute Care list provided to:: Patient Choice offered to / list presented to : Patient  Discharge Placement              Patient [facility] bed at: [facility] Patient to be transferred to facility by: [place] Name of family member notified: [name] Patient and family notified of of transfer: 03/26/21  Discharge Plan and Services In-house Referral: Clinical Social Work                                   Social Determinants of Health (SDOH) Interventions     Readmission Risk Interventions Readmission Risk Prevention Plan 03/17/2021  Transportation Screening Complete  HRI or Home Care Consult Complete  SW Recovery Care/Counseling Consult Complete  Skilled Nursing Facility Complete  Some recent data might be hidden

## 2021-03-26 NOTE — Progress Notes (Signed)
C/o mild nausea and hiccups. Zofran 4 mg IV given.

## 2021-03-27 ENCOUNTER — Emergency Department (HOSPITAL_COMMUNITY): Payer: Medicare Other

## 2021-03-27 ENCOUNTER — Inpatient Hospital Stay (HOSPITAL_COMMUNITY)
Admission: EM | Admit: 2021-03-27 | Discharge: 2021-04-05 | DRG: 444 | Disposition: A | Discharge status: Skilled Nursing Facility | Payer: Medicare Other | Source: Skilled Nursing Facility | Attending: Internal Medicine | Admitting: Internal Medicine

## 2021-03-27 ENCOUNTER — Other Ambulatory Visit: Payer: Self-pay

## 2021-03-27 DIAGNOSIS — R1110 Vomiting, unspecified: Secondary | ICD-10-CM

## 2021-03-27 DIAGNOSIS — I120 Hypertensive chronic kidney disease with stage 5 chronic kidney disease or end stage renal disease: Secondary | ICD-10-CM | POA: Diagnosis present

## 2021-03-27 DIAGNOSIS — L8990 Pressure ulcer of unspecified site, unspecified stage: Secondary | ICD-10-CM | POA: Insufficient documentation

## 2021-03-27 DIAGNOSIS — N2581 Secondary hyperparathyroidism of renal origin: Secondary | ICD-10-CM | POA: Diagnosis present

## 2021-03-27 DIAGNOSIS — H548 Legal blindness, as defined in USA: Secondary | ICD-10-CM | POA: Diagnosis present

## 2021-03-27 DIAGNOSIS — L89152 Pressure ulcer of sacral region, stage 2: Secondary | ICD-10-CM | POA: Diagnosis present

## 2021-03-27 DIAGNOSIS — J90 Pleural effusion, not elsewhere classified: Secondary | ICD-10-CM

## 2021-03-27 DIAGNOSIS — N184 Chronic kidney disease, stage 4 (severe): Secondary | ICD-10-CM

## 2021-03-27 DIAGNOSIS — J189 Pneumonia, unspecified organism: Secondary | ICD-10-CM | POA: Diagnosis not present

## 2021-03-27 DIAGNOSIS — Z8546 Personal history of malignant neoplasm of prostate: Secondary | ICD-10-CM

## 2021-03-27 DIAGNOSIS — Z20822 Contact with and (suspected) exposure to covid-19: Secondary | ICD-10-CM | POA: Diagnosis present

## 2021-03-27 DIAGNOSIS — E8809 Other disorders of plasma-protein metabolism, not elsewhere classified: Secondary | ICD-10-CM | POA: Diagnosis not present

## 2021-03-27 DIAGNOSIS — Z79899 Other long term (current) drug therapy: Secondary | ICD-10-CM

## 2021-03-27 DIAGNOSIS — I480 Paroxysmal atrial fibrillation: Secondary | ICD-10-CM | POA: Diagnosis present

## 2021-03-27 DIAGNOSIS — I4510 Unspecified right bundle-branch block: Secondary | ICD-10-CM

## 2021-03-27 DIAGNOSIS — I2510 Atherosclerotic heart disease of native coronary artery without angina pectoris: Secondary | ICD-10-CM | POA: Diagnosis present

## 2021-03-27 DIAGNOSIS — Z954 Presence of other heart-valve replacement: Secondary | ICD-10-CM

## 2021-03-27 DIAGNOSIS — E781 Pure hyperglyceridemia: Secondary | ICD-10-CM | POA: Diagnosis present

## 2021-03-27 DIAGNOSIS — E7800 Pure hypercholesterolemia, unspecified: Secondary | ICD-10-CM | POA: Diagnosis present

## 2021-03-27 DIAGNOSIS — E785 Hyperlipidemia, unspecified: Secondary | ICD-10-CM | POA: Diagnosis present

## 2021-03-27 DIAGNOSIS — R112 Nausea with vomiting, unspecified: Secondary | ICD-10-CM | POA: Diagnosis not present

## 2021-03-27 DIAGNOSIS — D631 Anemia in chronic kidney disease: Secondary | ICD-10-CM

## 2021-03-27 DIAGNOSIS — E1122 Type 2 diabetes mellitus with diabetic chronic kidney disease: Secondary | ICD-10-CM | POA: Diagnosis present

## 2021-03-27 DIAGNOSIS — E1151 Type 2 diabetes mellitus with diabetic peripheral angiopathy without gangrene: Secondary | ICD-10-CM | POA: Diagnosis present

## 2021-03-27 DIAGNOSIS — M898X9 Other specified disorders of bone, unspecified site: Secondary | ICD-10-CM | POA: Diagnosis present

## 2021-03-27 DIAGNOSIS — K810 Acute cholecystitis: Secondary | ICD-10-CM | POA: Diagnosis not present

## 2021-03-27 DIAGNOSIS — J9811 Atelectasis: Secondary | ICD-10-CM | POA: Diagnosis present

## 2021-03-27 DIAGNOSIS — N186 End stage renal disease: Secondary | ICD-10-CM | POA: Diagnosis not present

## 2021-03-27 DIAGNOSIS — Z89512 Acquired absence of left leg below knee: Secondary | ICD-10-CM

## 2021-03-27 DIAGNOSIS — R066 Hiccough: Secondary | ICD-10-CM | POA: Diagnosis present

## 2021-03-27 DIAGNOSIS — Z66 Do not resuscitate: Secondary | ICD-10-CM | POA: Diagnosis not present

## 2021-03-27 DIAGNOSIS — E871 Hypo-osmolality and hyponatremia: Secondary | ICD-10-CM | POA: Diagnosis present

## 2021-03-27 DIAGNOSIS — G20 Parkinson's disease: Secondary | ICD-10-CM | POA: Diagnosis present

## 2021-03-27 DIAGNOSIS — E782 Mixed hyperlipidemia: Secondary | ICD-10-CM

## 2021-03-27 DIAGNOSIS — D539 Nutritional anemia, unspecified: Secondary | ICD-10-CM

## 2021-03-27 DIAGNOSIS — K8000 Calculus of gallbladder with acute cholecystitis without obstruction: Secondary | ICD-10-CM | POA: Diagnosis not present

## 2021-03-27 DIAGNOSIS — H547 Unspecified visual loss: Secondary | ICD-10-CM

## 2021-03-27 DIAGNOSIS — E1129 Type 2 diabetes mellitus with other diabetic kidney complication: Secondary | ICD-10-CM | POA: Diagnosis present

## 2021-03-27 DIAGNOSIS — R0602 Shortness of breath: Secondary | ICD-10-CM

## 2021-03-27 DIAGNOSIS — H409 Unspecified glaucoma: Secondary | ICD-10-CM | POA: Diagnosis present

## 2021-03-27 DIAGNOSIS — H543 Unqualified visual loss, both eyes: Secondary | ICD-10-CM | POA: Diagnosis not present

## 2021-03-27 DIAGNOSIS — I739 Peripheral vascular disease, unspecified: Secondary | ICD-10-CM

## 2021-03-27 DIAGNOSIS — Z952 Presence of prosthetic heart valve: Secondary | ICD-10-CM

## 2021-03-27 DIAGNOSIS — Z7902 Long term (current) use of antithrombotics/antiplatelets: Secondary | ICD-10-CM

## 2021-03-27 DIAGNOSIS — Z87891 Personal history of nicotine dependence: Secondary | ICD-10-CM

## 2021-03-27 DIAGNOSIS — Z992 Dependence on renal dialysis: Secondary | ICD-10-CM

## 2021-03-27 DIAGNOSIS — K219 Gastro-esophageal reflux disease without esophagitis: Secondary | ICD-10-CM | POA: Diagnosis present

## 2021-03-27 DIAGNOSIS — E876 Hypokalemia: Secondary | ICD-10-CM | POA: Diagnosis not present

## 2021-03-27 DIAGNOSIS — E1140 Type 2 diabetes mellitus with diabetic neuropathy, unspecified: Secondary | ICD-10-CM | POA: Diagnosis present

## 2021-03-27 DIAGNOSIS — I10 Essential (primary) hypertension: Secondary | ICD-10-CM

## 2021-03-27 DIAGNOSIS — R109 Unspecified abdominal pain: Secondary | ICD-10-CM

## 2021-03-27 LAB — COMPREHENSIVE METABOLIC PANEL
ALT: 6 U/L (ref 0–44)
AST: 35 U/L (ref 15–41)
Albumin: 2.4 g/dL — ABNORMAL LOW (ref 3.5–5.0)
Alkaline Phosphatase: 191 U/L — ABNORMAL HIGH (ref 38–126)
Anion gap: 16 — ABNORMAL HIGH (ref 5–15)
BUN: 35 mg/dL — ABNORMAL HIGH (ref 8–23)
CO2: 22 mmol/L (ref 22–32)
Calcium: 8.8 mg/dL — ABNORMAL LOW (ref 8.9–10.3)
Chloride: 93 mmol/L — ABNORMAL LOW (ref 98–111)
Creatinine, Ser: 5.6 mg/dL — ABNORMAL HIGH (ref 0.61–1.24)
GFR, Estimated: 10 mL/min — ABNORMAL LOW (ref >60)
Glucose, Bld: 123 mg/dL — ABNORMAL HIGH (ref 70–99)
Potassium: 4.6 mmol/L (ref 3.5–5.1)
Sodium: 131 mmol/L — ABNORMAL LOW (ref 135–145)
Total Bilirubin: 1 mg/dL (ref 0.3–1.2)
Total Protein: 6.8 g/dL (ref 6.5–8.1)

## 2021-03-27 LAB — URINALYSIS, ROUTINE W REFLEX MICROSCOPIC
Bilirubin Urine: NEGATIVE
Glucose, UA: 50 mg/dL — AB
Ketones, ur: NEGATIVE mg/dL
Nitrite: NEGATIVE
Protein, ur: 100 mg/dL — AB
Specific Gravity, Urine: 1.008 (ref 1.005–1.030)
WBC, UA: >50 WBC/hpf — ABNORMAL HIGH (ref 0–5)
pH: 8 (ref 5.0–8.0)

## 2021-03-27 LAB — CBC
HCT: 33.7 % — ABNORMAL LOW (ref 39.0–52.0)
Hemoglobin: 11.4 g/dL — ABNORMAL LOW (ref 13.0–17.0)
MCH: 34.9 pg — ABNORMAL HIGH (ref 26.0–34.0)
MCHC: 33.8 g/dL (ref 30.0–36.0)
MCV: 103.1 fL — ABNORMAL HIGH (ref 80.0–100.0)
Platelets: 490 10*3/uL — ABNORMAL HIGH (ref 150–400)
RBC: 3.27 MIL/uL — ABNORMAL LOW (ref 4.22–5.81)
RDW: 14.4 % (ref 11.5–15.5)
WBC: 15.7 10*3/uL — ABNORMAL HIGH (ref 4.0–10.5)
nRBC: 0 % (ref 0.0–0.2)

## 2021-03-27 LAB — RESP PANEL BY RT-PCR (FLU A&B, COVID) ARPGX2
Influenza A by PCR: NEGATIVE
Influenza B by PCR: NEGATIVE
SARS Coronavirus 2 by RT PCR: NEGATIVE

## 2021-03-27 LAB — CBG MONITORING, ED: Glucose-Capillary: 92 mg/dL (ref 70–99)

## 2021-03-27 LAB — LIPASE, BLOOD: Lipase: 30 U/L (ref 11–51)

## 2021-03-27 MED ORDER — PILOCARPINE HCL 4 % OP SOLN
1.0000 [drp] | Freq: Four times a day (QID) | OPHTHALMIC | Status: DC
  Administered 2021-03-28 – 2021-04-05 (×30): 1 [drp] via OPHTHALMIC
  Filled 2021-03-27: qty 15

## 2021-03-27 MED ORDER — ACETAMINOPHEN 325 MG PO TABS
650.0000 mg | ORAL_TABLET | Freq: Four times a day (QID) | ORAL | Status: DC | PRN
  Administered 2021-03-29 – 2021-04-04 (×3): 650 mg via ORAL
  Filled 2021-03-27 (×3): qty 2

## 2021-03-27 MED ORDER — PIPERACILLIN-TAZOBACTAM IN DEX 2-0.25 GM/50ML IV SOLN
2.2500 g | Freq: Three times a day (TID) | INTRAVENOUS | Status: DC
  Administered 2021-03-27 – 2021-04-02 (×16): 2.25 g via INTRAVENOUS
  Filled 2021-03-27 (×22): qty 50

## 2021-03-27 MED ORDER — ONDANSETRON HCL 4 MG/2ML IJ SOLN
4.0000 mg | Freq: Four times a day (QID) | INTRAMUSCULAR | Status: DC | PRN
  Administered 2021-03-27 – 2021-04-02 (×10): 4 mg via INTRAVENOUS
  Filled 2021-03-27 (×10): qty 2

## 2021-03-27 MED ORDER — VANCOMYCIN HCL 1500 MG/300ML IV SOLN
1500.0000 mg | Freq: Once | INTRAVENOUS | Status: AC
  Administered 2021-03-27: 1500 mg via INTRAVENOUS
  Filled 2021-03-27: qty 300

## 2021-03-27 MED ORDER — METOPROLOL TARTRATE 12.5 MG HALF TABLET
12.5000 mg | ORAL_TABLET | Freq: Two times a day (BID) | ORAL | Status: DC
Start: 2021-03-27 — End: 2021-04-05
  Administered 2021-03-27 – 2021-04-05 (×15): 12.5 mg via ORAL
  Filled 2021-03-27 (×17): qty 1

## 2021-03-27 MED ORDER — GABAPENTIN 100 MG PO CAPS
100.0000 mg | ORAL_CAPSULE | Freq: Every day | ORAL | Status: DC
  Administered 2021-03-27 – 2021-04-04 (×8): 100 mg via ORAL
  Filled 2021-03-27 (×9): qty 1

## 2021-03-27 MED ORDER — BRIMONIDINE TARTRATE 0.15 % OP SOLN
1.0000 [drp] | Freq: Three times a day (TID) | OPHTHALMIC | Status: DC
  Administered 2021-03-28 – 2021-04-05 (×22): 1 [drp] via OPHTHALMIC
  Filled 2021-03-27: qty 5

## 2021-03-27 MED ORDER — ACETAMINOPHEN 325 MG PO TABS: 650.0000 mg | ORAL_TABLET | Freq: Four times a day (QID) | ORAL | Status: DC | PRN

## 2021-03-27 MED ORDER — SODIUM CHLORIDE 0.9 % IV SOLN
12.5000 mg | Freq: Three times a day (TID) | INTRAVENOUS | Status: DC | PRN
  Administered 2021-03-30: 12.5 mg via INTRAVENOUS
  Filled 2021-03-27 (×9): qty 0.5

## 2021-03-27 MED ORDER — INSULIN ASPART 100 UNIT/ML IJ SOLN: 0.0000 [IU] | INTRAMUSCULAR | Status: DC

## 2021-03-27 MED ORDER — DORZOLAMIDE HCL-TIMOLOL MAL 2-0.5 % OP SOLN
1.0000 [drp] | Freq: Two times a day (BID) | OPHTHALMIC | Status: DC
  Administered 2021-03-28 – 2021-04-05 (×16): 1 [drp] via OPHTHALMIC
  Filled 2021-03-27: qty 10

## 2021-03-27 MED ORDER — CARBIDOPA-LEVODOPA 25-100 MG PO TABS
1.0000 | ORAL_TABLET | Freq: Three times a day (TID) | ORAL | Status: DC
  Administered 2021-03-28 – 2021-04-05 (×22): 1 via ORAL
  Filled 2021-03-27 (×24): qty 1

## 2021-03-27 MED ORDER — ONDANSETRON HCL 4 MG/2ML IJ SOLN
4.0000 mg | Freq: Once | INTRAMUSCULAR | Status: AC
  Administered 2021-03-27: 4 mg via INTRAVENOUS
  Filled 2021-03-27: qty 2

## 2021-03-27 MED ORDER — CARBIDOPA-LEVODOPA 25-100 MG PO TABS: 1.0000 | ORAL_TABLET | Freq: Three times a day (TID) | ORAL | Status: DC

## 2021-03-27 MED ORDER — MORPHINE SULFATE (PF) 4 MG/ML IV SOLN
4.0000 mg | Freq: Once | INTRAVENOUS | Status: DC
  Filled 2021-03-27: qty 1

## 2021-03-27 MED ORDER — HYDROMORPHONE HCL 1 MG/ML IJ SOLN
0.5000 mg | INTRAMUSCULAR | Status: DC | PRN
  Administered 2021-03-27 – 2021-03-28 (×3): 1 mg via INTRAVENOUS
  Administered 2021-03-29: 0.5 mg via INTRAVENOUS
  Administered 2021-03-29 – 2021-03-30 (×2): 1 mg via INTRAVENOUS
  Administered 2021-03-30: 0.5 mg via INTRAVENOUS
  Filled 2021-03-27 (×7): qty 1

## 2021-03-27 MED ORDER — NETARSUDIL-LATANOPROST 0.02-0.005 % OP SOLN: 1.0000 [drp] | Freq: Every day | OPHTHALMIC | Status: DC

## 2021-03-27 MED ORDER — OXYCODONE-ACETAMINOPHEN 5-325 MG PO TABS: 1.0000 | ORAL_TABLET | Freq: Once | ORAL | Status: DC

## 2021-03-27 NOTE — ED Provider Notes (Signed)
[facility] Emergency Department Provider Note MRN:  [id number]  Arrival date & time: 03/27/21     Chief Complaint   Abdominal Pain   History of Present Illness   Elijah White is a 81 y.o. year-old male with a history of  ESRD, atrial stenosis, coronary artery disease, blindness, left BKA, type 2 diabetes, hypertension, HLD presenting to the ED with chief complaint of worsening right upper quadrant abdominal pain with associated nausea vomiting and hiccups.  The patient had recently been admitted from 1/11 through 1/22 for acute cholecystitis managed with percutaneous cystectomy tubes that were removed prior to discharge.  Patient states that he was discharged for roughly 24 hours when his symptoms returned and were worse.  Patient states that he had severe right upper quadrant pain that was sharp and associated with nausea and vomiting as well as fever without chills.  Patient denies that he had diarrhea, chest pain, shortness of breath.  Review of Systems  A thorough review of systems was obtained and all systems are negative except as noted in the HPI and PMH.   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  08/2001 colonoscopy: tubular adenoma at cecum.     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   Elevated cholesterol with high triglycerides    ESRD (end stage renal disease) (HCC)    [facility]   GERD (gastroesophageal reflux disease)    Glaucoma    Hypertension    Legally blind in left eye, as defined in [place]    has pinpoint vision in right eye   PAD (peripheral artery disease) ([place])    RBBB     Past Surgical History:  Procedure Laterality Date   ABDOMINAL AORTOGRAM W/LOWER EXTREMITY N/A  07/25/2020   Procedure: ABDOMINAL AORTOGRAM W/LOWER EXTREMITY;  Surgeon: Marty Heck, MD;  Location: [place] [facility];  Service: Cardiovascular;  Laterality: N/A;   AMPUTATION Left 08/12/2020   Procedure: LEFT BELOW KNEE AMPUTATION;  Surgeon: Marty Heck, MD;  Location: [place];  Service: Vascular;  Laterality: Left;   AV FISTULA PLACEMENT Left 03/20/2018   Procedure: ARTERIOVENOUS (AV) FISTULA CREATION ARM;  Surgeon: [name], MD;  Location: [place];  Service: Vascular;  Laterality: Left;   [place] Left 05/21/2018   Procedure: LEFT BASILIC VEIN FISTULA SECOND STAGE;  Surgeon: [name], MD;  Location: [place];  Service: Vascular;  Laterality: Left;   BIOPSY  03/25/2021   Procedure: BIOPSY;  Surgeon: Otis Brace, MD;  Location: [facility] [facility];  Service: Gastroenterology;;   COLONOSCOPY     COLONOSCOPY WITH PROPOFOL N/A 11/30/2019   Procedure: COLONOSCOPY WITH PROPOFOL;  Surgeon: Ronnette Juniper, MD;  Location: [place];  Service: Gastroenterology;  Laterality: N/A;   ESOPHAGOGASTRODUODENOSCOPY (EGD) WITH PROPOFOL N/A 03/25/2021   Procedure: ESOPHAGOGASTRODUODENOSCOPY (EGD) WITH PROPOFOL;  Surgeon: Otis Brace, MD;  Location: [place];  Service: Gastroenterology;  Laterality: N/A;   GLAUCOMA SURGERY  2019   multiple surgeries   HEMOSTASIS CLIP PLACEMENT  11/30/2019   Procedure: HEMOSTASIS CLIP PLACEMENT;  Surgeon: Ronnette Juniper, MD;  Location: [place];  Service: Gastroenterology;;  IR CHOLANGIOGRAM EXISTING TUBE  03/21/2021   IR PERC CHOLECYSTOSTOMY  03/16/2021   IR REMOVAL BILIARY DRAIN  03/21/2021   KNEE ARTHROSCOPY     MULTIPLE EXTRACTIONS WITH ALVEOLOPLASTY N/A 12/30/2019   Procedure: MULTIPLE EXTRACTION WITH ALVEOLOPLASTY;  Surgeon: Charlaine Dalton, DMD;  Location: [place];  Service: Dentistry;  Laterality: N/A;   PERIPHERAL VASCULAR INTERVENTION Left 07/25/2020   Procedure: PERIPHERAL VASCULAR INTERVENTION;   Surgeon: Marty Heck, MD;  Location: [place] [facility];  Service: Cardiovascular;  Laterality: Left;  superficial femoral   POLYPECTOMY  11/30/2019   Procedure: POLYPECTOMY;  Surgeon: Ronnette Juniper, MD;  Location: [place];  Service: Gastroenterology;;   PROSTATE SURGERY     RIGHT/LEFT HEART CATH AND CORONARY ANGIOGRAPHY N/A 12/03/2019   Procedure: RIGHT/LEFT HEART CATH AND CORONARY ANGIOGRAPHY;  Surgeon: Burnell Blanks, MD;  Location: [place] [facility];  Service: Cardiovascular;  Laterality: N/A;   SUBMUCOSAL TATTOO INJECTION  11/30/2019   Procedure: SUBMUCOSAL TATTOO INJECTION;  Surgeon: Ronnette Juniper, MD;  Location: [place];  Service: Gastroenterology;;   TEE WITHOUT CARDIOVERSION N/A 01/19/2020   Procedure: TRANSESOPHAGEAL ECHOCARDIOGRAM (TEE);  Surgeon: Burnell Blanks, MD;  Location: [place] [facility];  Service: Open Heart Surgery;  Laterality: N/A;   TRANSCATHETER AORTIC VALVE REPLACEMENT, TRANSFEMORAL Left 01/19/2020   Procedure: TRANSCATHETER AORTIC VALVE REPLACEMENT, LEFT TRANSFEMORAL;  Surgeon: Burnell Blanks, MD;  Location: [place] [facility];  Service: Open Heart Surgery;  Laterality: Left;    Family History  Problem Relation Age of Onset   CAD Mother     Social History   Socioeconomic History   Marital status: Widowed    Spouse name: Not on file   Number of children: Not on file   Years of education: Not on file   Highest education level: Not on file  Occupational History   Occupation: retired  Tobacco Use   Smoking status: Former    Packs/day: 0.50    Types: Cigarettes    Quit date: 03/23/2018    Years since quitting: 3.0   Smokeless tobacco: Never  Vaping Use   Vaping Use: Never used  Substance and Sexual Activity   Alcohol use: No   Drug use: No   Sexual activity: Not Currently  Other Topics Concern   Not on file  Social History Narrative   Right handed    Facility   Social Determinants of Health   Financial  Resource Strain: Low Risk    Difficulty of Paying Living Expenses: Not hard at all  Food Insecurity: No Food Insecurity   Worried About Charity fundraiser in the Last Year: Never true   [place] in the Last Year: Never true  Transportation Needs: No Transportation Needs   Lack of Transportation (Medical): No   Lack of Transportation (Non-Medical): No  Physical Activity: Inactive   Days of Exercise per Week: 0 days   Minutes of Exercise per Session: 0 min  Stress: No Stress Concern Present   Feeling of Stress : Not at all  Social Connections: Not on file  Intimate Partner Violence: Not on file     Physical Exam   Vitals:   03/27/21 1500 03/27/21 1535  BP: (!) 165/104 (!) 160/82  Pulse: 86 88  Resp:  17  Temp:    SpO2: 100% 100%    CONSTITUTIONAL: Ill-appearing, NAD NEURO/PSYCH:  Alert and oriented x 3, no focal deficits EYES:  eyes equal and reactive ENT/NECK:  no LAD,  no JVD CARDIO: Normal sinus rhythm well-perfused, normal S1 and S2 PULM:  CTAB no wheezing or rhonchi GI/GU: Right upper quadrant abdominal tenderness with positive Murphy sign.  No area of erythema surrounding the previous cholecystectomy tube MSK/SPINE:  No gross deformities, no edema SKIN:  no rash, atraumatic   *Additional and/or pertinent findings included in MDM below  Diagnostic and Interventional Summary    EKG Interpretation  Date/Time:    Ventricular Rate:    PR Interval:    QRS Duration:   QT Interval:    QTC Calculation:   R Axis:     Text Interpretation:         Labs Reviewed  COMPREHENSIVE METABOLIC PANEL - Abnormal; Notable for the following components:      Result Value   Sodium 131 (*)    Chloride 93 (*)    Glucose, Bld 123 (*)    BUN 35 (*)    Creatinine, Ser 5.60 (*)    Calcium 8.8 (*)    Albumin 2.4 (*)    Alkaline Phosphatase 191 (*)    GFR, Estimated 10 (*)    Anion gap 16 (*)    All other components within normal limits  CBC - Abnormal; Notable for the  following components:   WBC 15.7 (*)    RBC 3.27 (*)    Hemoglobin 11.4 (*)    HCT 33.7 (*)    MCV 103.1 (*)    MCH 34.9 (*)    Platelets 490 (*)    All other components within normal limits  URINALYSIS, ROUTINE W REFLEX MICROSCOPIC - Abnormal; Notable for the following components:   APPearance HAZY (*)    Glucose, UA 50 (*)    Hgb urine dipstick MODERATE (*)    Protein, ur 100 (*)    Leukocytes,Ua LARGE (*)    WBC, UA >50 (*)    Bacteria, UA RARE (*)    All other components within normal limits  RESP PANEL BY RT-PCR (FLU A&B, COVID) [id number]  LIPASE, BLOOD    DG Chest 2 View  Final Result    US Abdomen Limited RUQ (LIVER/GB)  Final Result      Medications  oxyCODONE-acetaminophen (PERCOCET/ROXICET) 5-325 MG per tablet 1 tablet (has no administration in time range)     Procedures  /  Critical Care Procedures  ED Course and Medical Decision Making  Initial Impression and Ddx This is an ill-appearing 81 year old male presents to emergency department for evaluation of abdominal pain.  Differential diagnosis includes but is not limited to the following: Recurrence of cholecystitis, choledocholithiasis, pancreatitis, pyelonephritis, pneumonia, ACS.  Given that the patient was discharged recently I suspect that he has recurrence of his cholecystitis.  Will obtain imaging to further evaluate.  Right upper quadrant ultrasound was reviewed independently which revealed cholecystitis.  Lab work-up reviewed independently does reveal signs of inflammation with a positive leukocytosis.  I was able to speak with both general surgery and interventional radiology regarding the care of this patient.  Interventional radiology plans to replace percutaneous Coley tube.  They requested that gastroenterology also be involved in the care of this patient.  Gastroenterology was also consulted.  Was covered with broad-spectrum antibiotics including Vanco and Zosyn.  Blood cultures were  obtained.  Past medical/surgical history that increases complexity of ED encounter: Cholecystitis  Interpretation of Diagnostics I personally reviewed the EKG, Chest Xray, and Cardiac Monitor and my interpretation is as follows: Normal sinus rhythm without acute cardiopulmonary abnormality seen on chest  x-ray.      Patient Reassessment and Ultimate Disposition/Management Will admit the patient to the hospitalist service for IR intervention.  Patient management required discussion with the following services or consulting groups:  Hospitalist Service, Gastroenterology, and General/Trauma Surgery  Complexity of Problems Addressed Acute illness or injury that poses threat of life of bodily function    Final Clinical Impressions(s) / ED Diagnoses     ICD-10-CM   1. Abdominal pain  R10.9 US Abdomen Limited RUQ (LIVER/GB)    US Abdomen Limited RUQ (LIVER/GB)      ED Discharge Orders     None        Discharge Instructions Discussed with and Provided to Patient:   Discharge Instructions   None       Zachery Dakins, MD 03/27/21 2127    Lorelle Gibbs, DO 03/28/21 2229

## 2021-03-27 NOTE — Progress Notes (Signed)
Pharmacy Antibiotic Note  Elijah White is a 81 y.o. male admitted on 03/27/2021 with  cholecystitis . PMH for ESRD on dialysis on MWF. Patient was recently discharged 1/22 for acute cholecystitis and complicated UTI on cefdinir 300 mg MWF. Pharmacy has been consulted for vancomycin dosing.  WBC elevated at 15.7 today, patient is currently afebrile.   Plan: Vancomycin IV 1500 mg x 1 Vancomycin 750 mg IV post-HD Zosyn per MD Follow-up HD schedule and adjust doses as needed (not ordered pending nephrology recs/plans) Monitor for signs of clinical improvement    Temp (24hrs), Avg:99 F (37.2 C), Min:98.9 F (37.2 C), Max:99.1 F (37.3 C)  Recent Labs  Lab 03/21/21 0840 03/22/21 0639 03/23/21 0722 03/24/21 0630 03/25/21 0420 03/26/21 0346 03/27/21 1143  WBC 13.2* 13.3* 11.3* 12.0* 11.5* 12.6* 15.7*  CREATININE 4.34* 5.46*  --   --   --  4.18* 5.60*    Estimated Creatinine Clearance: 8.8 mL/min (A) (by C-G formula based on SCr of 5.6 mg/dL (H)).    No Known Allergies  Antimicrobials this admission: 1/23 Zosyn >>  1/23 Vancomycin >>   Dose adjustments this admission: none  Microbiology results: 1/14 BCx: no growth 1/14 UCx: no growth   Thank you for involving pharmacy in this patient's care.  Elita Quick, PharmD PGY1 Ambulatory Care Pharmacy Resident 03/27/2021 4:39 PM  **Pharmacist phone directory can be found on [email or web address] listed under [place]**

## 2021-03-27 NOTE — ED Triage Notes (Signed)
Pt from [facility] and rehab for abdominal pain, n/v since yesterday. D/c from hospital admission yesterday for cholecystis and complicated UTI.

## 2021-03-27 NOTE — ED Provider Triage Note (Signed)
Emergency Medicine Provider Triage Evaluation Note  Elijah White , a 81 y.o. male  was evaluated in triage.  Pt complains of abdominal pain.  Patient is a Monday Wednesday Friday dialysis patient and missed dialysis today due to NVD.  Discharged from the hospital 2 days ago after being treated for gallstones.  He also had aspiration pneumonia at that time.   Review of Systems  Asked for [place] to get history from his daughter.  She has been contacted. Physical Exam  BP (!) 153/78    Pulse 99    Temp 99.1 F (37.3 C) (Oral)    Resp 16    SpO2 97%  Gen:   Awake, no distress   Resp:  Normal effort  MSK:   Moves extremities without difficulty  Other:  Tachycardic, ill-appearing,  Medical Decision Making  Medically screening exam initiated at 2:22 PM.  Appropriate orders placed.  Elijah White was informed that the remainder of the evaluation will be completed by another provider, this initial triage assessment does not replace that evaluation, and the importance of remaining in the ED until their evaluation is complete.  Patient very ill-appearing, nursing notified that he needs a room.   Rhae Hammock, PA-C 03/27/21 1431

## 2021-03-27 NOTE — H&P (Signed)
History and Physical   RENALDO GORNICK PIR:[id number] DOB: 07/24/1940 DOA: 03/27/2021  Referring MD/NP/PA: Judith Blonder, EDP PCP: Maryella Shivers, MD  Patient coming from: [facility]  Chief Complaint: Abd pain, N/V  HPI: KARIS EMIG is an 81yo M with a history of ESRD (HD MWF), AS s/p TAVR 2021, CAD, PD, blindness, PVD s/p L BKA, T2DM, HTN, HLD who presented to the ED 1/23 with abdominal pain, nausea, vomiting, and hiccups that worsened in the 24 hours since he was discharged. He had been admitted 1/11 - 1/22 for acute cholecystitis managed with percutaneous cholecystostomy tube (1/12 which was unintentionally dislodged and subsequently completely removed on 1/19), and the patient discharged with plans to continue antibiotics as recommended by ID. That hospitalization complicated by new onset AFib for which converted to NSR, and urinary retention for which he was discharged with a foley catheter. He says he was feeling better but developed worsening, severe pain in the whole abdomen, worse in RUQ that is constant, associated with nausea and vomiting. Hiccups have also gotten worse.   ED Course: Afebrile with worsening leukocytosis (WBC 15.7k from 12.6k at discharge 1/22). RUQ U/S showed a distended gallbladder with cholelithiasis, sludge, and pericholecytic fluid. On exam and during U/S, Murphy's sign was positive. IR and general surgery were consulted, HIDA recommended. Medicine called to readmit.    Review of Systems: No fevers or chills, does have dull headache across forehead since this afternoon, and per HPI. All others reviewed and are negative.   Past Medical History:  Diagnosis Date   Anemia    low iron   Aortic stenosis    s/p TAVR 01/19/20   Arthritis    Cancer [facility])    prostate, s/p I-125 seed implant 05/18/05   Colon polyps ~ 1993 and 2003   Dr [name], [facility].  08/2001 colonoscopy: tubular adenoma at cecum.     Diabetes mellitus without complication (HCC)    Type II -  no medications   Elevated cholesterol with high triglycerides    ESRD (end stage renal disease) (HCC)    [facility]   GERD (gastroesophageal reflux disease)    Glaucoma    Hypertension    Legally blind in left eye, as defined in [place]    has pinpoint vision in right eye   PAD (peripheral artery disease) ([place])    RBBB    Past Surgical History:  Procedure Laterality Date   ABDOMINAL AORTOGRAM W/LOWER EXTREMITY N/A 07/25/2020   Procedure: ABDOMINAL AORTOGRAM W/LOWER EXTREMITY;  Surgeon: Marty Heck, MD;  Location: [place] [facility];  Service: Cardiovascular;  Laterality: N/A;   AMPUTATION Left 08/12/2020   Procedure: LEFT BELOW KNEE AMPUTATION;  Surgeon: Marty Heck, MD;  Location: [place];  Service: Vascular;  Laterality: Left;   AV FISTULA PLACEMENT Left 03/20/2018   Procedure: ARTERIOVENOUS (AV) FISTULA CREATION ARM;  Surgeon: [name], MD;  Location: [place];  Service: Vascular;  Laterality: Left;   [place] Left 05/21/2018   Procedure: LEFT BASILIC VEIN FISTULA SECOND STAGE;  Surgeon: [name], MD;  Location: [place];  Service: Vascular;  Laterality: Left;   BIOPSY  03/25/2021   Procedure: BIOPSY;  Surgeon: Otis Brace, MD;  Location: [place] [facility];  Service: Gastroenterology;;   COLONOSCOPY     COLONOSCOPY WITH PROPOFOL N/A 11/30/2019   Procedure: COLONOSCOPY WITH PROPOFOL;  Surgeon: Ronnette Juniper, MD;  Location: [place];  Service: Gastroenterology;  Laterality: N/A;   ESOPHAGOGASTRODUODENOSCOPY (  EGD) WITH PROPOFOL N/A 03/25/2021   Procedure: ESOPHAGOGASTRODUODENOSCOPY (EGD) WITH PROPOFOL;  Surgeon: Otis Brace, MD;  Location: [place];  Service: Gastroenterology;  Laterality: N/A;   GLAUCOMA SURGERY  2019   multiple surgeries   HEMOSTASIS CLIP PLACEMENT  11/30/2019   Procedure: HEMOSTASIS CLIP PLACEMENT;  Surgeon: Ronnette Juniper, MD;  Location: [facility];  Service: Gastroenterology;;   IR  CHOLANGIOGRAM EXISTING TUBE  03/21/2021   IR PERC CHOLECYSTOSTOMY  03/16/2021   IR REMOVAL BILIARY DRAIN  03/21/2021   KNEE ARTHROSCOPY     MULTIPLE EXTRACTIONS WITH ALVEOLOPLASTY N/A 12/30/2019   Procedure: MULTIPLE EXTRACTION WITH ALVEOLOPLASTY;  Surgeon: Charlaine Dalton, DMD;  Location: [place];  Service: Dentistry;  Laterality: N/A;   PERIPHERAL VASCULAR INTERVENTION Left 07/25/2020   Procedure: PERIPHERAL VASCULAR INTERVENTION;  Surgeon: Marty Heck, MD;  Location: [place] [facility];  Service: Cardiovascular;  Laterality: Left;  superficial femoral   POLYPECTOMY  11/30/2019   Procedure: POLYPECTOMY;  Surgeon: Ronnette Juniper, MD;  Location: [place];  Service: Gastroenterology;;   PROSTATE SURGERY     RIGHT/LEFT HEART CATH AND CORONARY ANGIOGRAPHY N/A 12/03/2019   Procedure: RIGHT/LEFT HEART CATH AND CORONARY ANGIOGRAPHY;  Surgeon: Burnell Blanks, MD;  Location: [place] [facility];  Service: Cardiovascular;  Laterality: N/A;   SUBMUCOSAL TATTOO INJECTION  11/30/2019   Procedure: SUBMUCOSAL TATTOO INJECTION;  Surgeon: Ronnette Juniper, MD;  Location: [place];  Service: Gastroenterology;;   TEE WITHOUT CARDIOVERSION N/A 01/19/2020   Procedure: TRANSESOPHAGEAL ECHOCARDIOGRAM (TEE);  Surgeon: Burnell Blanks, MD;  Location: [place] [facility];  Service: Open Heart Surgery;  Laterality: N/A;   TRANSCATHETER AORTIC VALVE REPLACEMENT, TRANSFEMORAL Left 01/19/2020   Procedure: TRANSCATHETER AORTIC VALVE REPLACEMENT, LEFT TRANSFEMORAL;  Surgeon: Burnell Blanks, MD;  Location: [place] [facility];  Service: Open Heart Surgery;  Laterality: Left;   -   reports that he quit smoking about 3 years ago. His smoking use included cigarettes. He smoked an average of .5 packs per day. He has never used smokeless tobacco. He reports that he does not drink alcohol and does not use drugs. No Known Allergies Family History  Problem Relation Age of Onset   CAD Mother    - Family  history otherwise reviewed and not pertinent.  Prior to Admission medications   Medication Sig Start Date End Date Taking? Authorizing Provider  acetaminophen (TYLENOL) 500 MG tablet Take 1,000 mg by mouth every 6 (six) hours as needed for moderate pain or headache.    [provider]  atorvastatin (LIPITOR) 10 MG tablet Take 1 tablet (10 mg total) by mouth in the morning. 08/16/20   Rhyne, Hulen Shouts, PA-C  B Complex-C-Folic Acid (DIALYVITE 638) 0.8 MG TABS Take 1 tablet by mouth in the morning. 05/08/19   [provider]  blood glucose meter kit and supplies KIT Dispense based on patient and insurance preference. Use up to four times daily as directed. (FOR ICD-9 250.00, 250.01). For QAC - HS accuchecks. 03/30/18   Thurnell Lose, MD  brimonidine (ALPHAGAN) 0.15 % ophthalmic solution Place 1 drop into the right eye 3 (three) times daily.    [provider]  carbidopa-levodopa (SINEMET) 25-100 MG tablet Take 1 tablet by mouth 3 (three) times daily. Patient taking differently: Take 1 tablet by mouth 3 (three) times daily. 1 TABLET @ 8am/ 12pm/ 4pm 10/19/20 05/13/21  Curatolo, Adam, DO  cefdinir (OMNICEF) 300 MG capsule Take 1 capsule (300 mg total) by mouth every Monday,  Wednesday, and Friday at 6 PM for 5 days. 03/24/21 03/29/21  Florencia Reasons, MD  clopidogrel (PLAVIX) 75 MG tablet Take 1 tablet (75 mg total) by mouth daily with breakfast. 07/25/20   Marty Heck, MD  docusate sodium (COLACE) 100 MG capsule Take 100 mg by mouth 2 (two) times daily.    [provider]  dorzolamide-timolol (COSOPT) 22.3-6.8 MG/ML ophthalmic solution Place 1 drop into the right eye 2 (two) times daily. 06/25/17   [provider]  Doxercalciferol (HECTOROL IV) Doxercalciferol (Hectorol) 08/09/20 08/28/21  [provider]  gabapentin (NEURONTIN) 100 MG capsule Take 1 capsule (100 mg total) by mouth daily. 10/19/20 05/13/21  Curatolo, Adam, DO  Melatonin 5 MG CAPS Take 5 mg by  mouth at bedtime.    [provider]  Methoxy PEG-Epoetin Beta (MIRCERA IJ) Mircera 09/05/20 09/04/21  [provider]  metoprolol tartrate (LOPRESSOR) 25 MG tablet Take 0.5 tablets (12.5 mg total) by mouth 2 (two) times daily. Hold if sbp less than 100 03/26/21 03/26/22  Florencia Reasons, MD  nystatin (MYCOSTATIN) 100000 UNIT/ML suspension Take 5 mLs (500,000 Units total) by mouth 4 (four) times daily. 03/26/21   Florencia Reasons, MD  ondansetron (ZOFRAN) 4 MG tablet Take 1 tablet (4 mg total) by mouth daily as needed for nausea or vomiting. 03/26/21   Florencia Reasons, MD  [facility] VERIO test strip USE AS DIRECTED TO TEST FOUR TIMES A DAY 07/31/18   [name], MD  pantoprazole (PROTONIX) 40 MG tablet Take 1 tablet (40 mg total) by mouth daily at 12 noon. 08/16/20   Rhyne, Hulen Shouts, PA-C  pilocarpine (PILOCAR) 4 % ophthalmic solution Place 1 drop into the right eye 4 (four) times daily.     [provider]  polyethylene glycol (MIRALAX / GLYCOLAX) 17 g packet Take 17 g by mouth daily. 03/26/21   Florencia Reasons, MD  ROCKLATAN 0.02-0.005 % SOLN Place 1 drop into the right eye at bedtime. 06/20/20   [provider]  senna-docusate (SENOKOT-S) 8.6-50 MG tablet Take 1 tablet by mouth at bedtime. 03/26/21   Florencia Reasons, MD  sevelamer carbonate (RENVELA) 800 MG tablet Take 2 tablets (1,600 mg total) by mouth 3 (three) times daily with meals. 03/26/21   Florencia Reasons, MD    Physical Exam: Vitals:   03/27/21 1600 03/27/21 1630 03/27/21 1730 03/27/21 1800  BP: (!) 165/72 (!) 171/65 (!) 170/64 (!) 167/61  Pulse: 81 85 90 85  Resp: _0 Temp:      TempSrc:      SpO2: 100% 100% 99% 99%   Constitutional: Chronically ill-appearing elderly male in no distress, calm demeanor Eyes: Lids and conjunctivae normal, PERRL ENMT: Mucous membranes are tacky. Posterior pharynx clear of any exudate or lesions. Poor dentition.  Neck: normal, supple, no masses, no thyromegaly Respiratory: Non-labored breathing room  air without accessory muscle use. Clear breath sounds to auscultation bilaterally diminished at bases Cardiovascular: Regular rate and rhythm, no murmurs, rubs, or gallops. No carotid bruits. No JVD. trace pitting dependent edema. Diminished R DP pulse. Abdomen: Normoactive bowel sounds. Abd is tender diffusely worst in RUQ, severely so but without rebound, mildly distended. +Murphy's sign. GU: + indwelling catheter Musculoskeletal: No clubbing / cyanosis. Left BKA with c/d/I stump site without wounds. LUA AVF +thrill Skin: Warm, dry. No rashes, wounds, or ulcers.   Neurologic: Blind. Speech normal. No focal deficits in motor strength or sensation in all extremities.  Psychiatric: Alert and oriented x3. Normal  judgment and insight. Mood euthymic with congruent affect.   Labs on Admission: I have personally reviewed following labs and imaging studies  CBC: Recent Labs  Lab 03/23/21 0722 03/24/21 0630 03/25/21 0420 03/26/21 0346 03/27/21 1143  WBC 11.3* 12.0* 11.5* 12.6* 15.7*  HGB 7.9* 9.1* 9.4* 8.7* 11.4*  HCT 23.8* 25.9* 27.3* 25.1* 33.7*  MCV 101.7* 101.2* 101.5* 101.6* 103.1*  PLT 350 411* 396 403* 751*   Basic Metabolic Panel: Recent Labs  Lab 03/21/21 0840 03/22/21 0639 03/26/21 0346 03/27/21 1143  NA 136 136 132* 131*  K 3.3* 3.7 4.0 4.6  CL 98 99 96* 93*  CO2 _0 GLUCOSE 133* 116* 104* 123*  BUN 36* 53* 25* 35*  CREATININE 4.34* 5.46* 4.18* 5.60*  CALCIUM 8.3* 8.6* 8.2* 8.8*  PHOS 2.5 3.3  --   --    GFR: Estimated Creatinine Clearance: 8.8 mL/min (A) (by C-G formula based on SCr of 5.6 mg/dL (H)). Liver Function Tests: Recent Labs  Lab 03/21/21 0840 03/22/21 0639 03/26/21 0346 03/27/21 1143  AST  --   --  18 35  ALT  --   --  6 6  ALKPHOS  --   --  135* 191*  BILITOT  --   --  0.7 1.0  PROT  --   --  5.3* 6.8  ALBUMIN 1.6* 1.7* 1.7* 2.4*   Recent Labs  Lab 03/27/21 1143  LIPASE 30   No results for input(s): AMMONIA in the last 168  hours. Coagulation Profile: No results for input(s): INR, PROTIME in the last 168 hours. Cardiac Enzymes: No results for input(s): CKTOTAL, CKMB, CKMBINDEX, TROPONINI in the last 168 hours. BNP (last 3 results) No results for input(s): PROBNP in the last 8760 hours. HbA1C: No results for input(s): HGBA1C in the last 72 hours. CBG: Recent Labs  Lab 03/26/21 0127 03/26/21 0332 03/26/21 0742 03/26/21 1137 03/26/21 1640  GLUCAP 95 100* 93 117* 84   Lipid Profile: No results for input(s): CHOL, HDL, LDLCALC, TRIG, CHOLHDL, LDLDIRECT in the last 72 hours. Thyroid Function Tests: No results for input(s): TSH, T4TOTAL, FREET4, T3FREE, THYROIDAB in the last 72 hours. Anemia Panel: No results for input(s): VITAMINB12, FOLATE, FERRITIN, TIBC, IRON, RETICCTPCT in the last 72 hours. Urine analysis:    Component Value Date/Time   COLORURINE YELLOW 03/27/2021 1130   APPEARANCEUR HAZY (A) 03/27/2021 1130   LABSPEC 1.008 03/27/2021 1130   PHURINE 8.0 03/27/2021 1130   GLUCOSEU 50 (A) 03/27/2021 1130   HGBUR MODERATE (A) 03/27/2021 1130   BILIRUBINUR NEGATIVE 03/27/2021 1130   BILIRUBINUR negative 02/19/2018 1643   KETONESUR NEGATIVE 03/27/2021 1130   PROTEINUR 100 (A) 03/27/2021 1130   UROBILINOGEN 0.2 02/19/2018 1643   UROBILINOGEN 1.0 08/22/2012 1402   NITRITE NEGATIVE 03/27/2021 1130   LEUKOCYTESUR LARGE (A) 03/27/2021 1130    Recent Results (from the past 240 hour(s))  Urine Culture     Status: None   Collection Time: 03/18/21 10:30 AM   Specimen: In/Out Cath Urine  Result Value Ref Range Status   Specimen Description IN/OUT CATH URINE  Final   Special Requests NONE  Final   Culture   Final    NO GROWTH Performed at [facility], [place] [address] [place], [place] [address]    Report Status 03/19/2021 FINAL  Final  Resp Panel by RT-PCR (Flu A&B, Covid) Nasopharyngeal Swab     Status: None   Collection Time: 03/22/21 12:42 PM   Specimen:  Nasopharyngeal Swab;  Nasopharyngeal(NP) swabs in vial transport medium  Result Value Ref Range Status   SARS Coronavirus 2 by RT PCR NEGATIVE NEGATIVE Final    Comment: (NOTE) SARS-CoV-2 target nucleic acids are NOT DETECTED.  The SARS-CoV-2 RNA is generally detectable in upper respiratory specimens during the acute phase of infection. The lowest concentration of SARS-CoV-2 viral copies this assay can detect is 138 copies/mL. A negative result does not preclude SARS-Cov-2 infection and should not be used as the sole basis for treatment or other patient management decisions. A negative result may occur with  improper specimen collection/handling, submission of specimen other than nasopharyngeal swab, presence of viral mutation(s) within the areas targeted by this assay, and inadequate number of viral copies(<138 copies/mL). A negative result must be combined with clinical observations, patient history, and epidemiological information. The expected result is Negative.  Fact Sheet for Patients:  EntrepreneurPulse.com.au  Fact Sheet for Healthcare Providers:  IncredibleEmployment.be  This test is no t yet approved or cleared by the [place] FDA and  has been authorized for detection and/or diagnosis of SARS-CoV-2 by FDA under an Emergency Use Authorization (EUA). This EUA will remain  in effect (meaning this test can be used) for the duration of the COVID-19 declaration under Section 564(b)(1) of the Act, 21 U.S.C.section 360bbb-3(b)(1), unless the authorization is terminated  or revoked sooner.       Influenza A by PCR NEGATIVE NEGATIVE Final   Influenza B by PCR NEGATIVE NEGATIVE Final    Comment: (NOTE) The Xpert Xpress SARS-CoV-2/FLU/RSV plus assay is intended as an aid in the diagnosis of influenza from Nasopharyngeal swab specimens and should not be used as a sole basis for treatment. Nasal washings and aspirates are unacceptable for Xpert Xpress  SARS-CoV-2/FLU/RSV testing.  Fact Sheet for Patients: EntrepreneurPulse.com.au  Fact Sheet for Healthcare Providers: IncredibleEmployment.be  This test is not yet approved or cleared by the [place] FDA and has been authorized for detection and/or diagnosis of SARS-CoV-2 by FDA under an Emergency Use Authorization (EUA). This EUA will remain in effect (meaning this test can be used) for the duration of the COVID-19 declaration under Section 564(b)(1) of the Act, 21 U.S.C. section 360bbb-3(b)(1), unless the authorization is terminated or revoked.  Performed at [facility], [place] [address] [place], [place] [address]   Resp Panel by RT-PCR (Flu A&B, Covid) Nasopharyngeal Swab     Status: None   Collection Time: 03/26/21 11:56 AM   Specimen: Nasopharyngeal Swab; Nasopharyngeal(NP) swabs in vial transport medium  Result Value Ref Range Status   SARS Coronavirus 2 by RT PCR NEGATIVE NEGATIVE Final    Comment: (NOTE) SARS-CoV-2 target nucleic acids are NOT DETECTED.  The SARS-CoV-2 RNA is generally detectable in upper respiratory specimens during the acute phase of infection. The lowest concentration of SARS-CoV-2 viral copies this assay can detect is 138 copies/mL. A negative result does not preclude SARS-Cov-2 infection and should not be used as the sole basis for treatment or other patient management decisions. A negative result may occur with  improper specimen collection/handling, submission of specimen other than nasopharyngeal swab, presence of viral mutation(s) within the areas targeted by this assay, and inadequate number of viral copies(<138 copies/mL). A negative result must be combined with clinical observations, patient history, and epidemiological information. The expected result is Negative.  Fact Sheet for Patients:  EntrepreneurPulse.com.au  Fact Sheet for Healthcare Providers:   IncredibleEmployment.be  This test is no t yet approved or cleared by the  [place] Architectural technologist and  has been authorized for detection and/or diagnosis of SARS-CoV-2 by FDA under an Print production planner (EUA). This EUA will remain  in effect (meaning this test can be used) for the duration of the COVID-19 declaration under Section 564(b)(1) of the Act, 21 U.S.C.section 360bbb-3(b)(1), unless the authorization is terminated  or revoked sooner.       Influenza A by PCR NEGATIVE NEGATIVE Final   Influenza B by PCR NEGATIVE NEGATIVE Final    Comment: (NOTE) The Xpert Xpress SARS-CoV-2/FLU/RSV plus assay is intended as an aid in the diagnosis of influenza from Nasopharyngeal swab specimens and should not be used as a sole basis for treatment. Nasal washings and aspirates are unacceptable for Xpert Xpress SARS-CoV-2/FLU/RSV testing.  Fact Sheet for Patients: EntrepreneurPulse.com.au  Fact Sheet for Healthcare Providers: IncredibleEmployment.be  This test is not yet approved or cleared by the [place] FDA and has been authorized for detection and/or diagnosis of SARS-CoV-2 by FDA under an Emergency Use Authorization (EUA). This EUA will remain in effect (meaning this test can be used) for the duration of the COVID-19 declaration under Section 564(b)(1) of the Act, 21 U.S.C. section 360bbb-3(b)(1), unless the authorization is terminated or revoked.  Performed at [facility], [place] [address], [place], [place] [address]      Radiological Exams on Admission: DG Chest 2 View  Result Date: 03/27/2021 CLINICAL DATA:  Assess, abdominal pain, nausea and vomiting since yesterday, recent hospitalization for cholecystitis and complicated UTI EXAM: CHEST - 2 VIEW COMPARISON:  03/17/2021 FINDINGS: Normal heart size post TAVR. Mediastinal contours and pulmonary vascularity normal. Moderate to large RIGHT pleural effusion with  small LEFT pleural effusion. Mild RIGHT basilar atelectasis and central peribronchial thickening. Upper lungs clear. No pneumothorax or acute osseous findings. IMPRESSION: Moderate to large RIGHT and small LEFT pleural effusions with associated RIGHT basilar atelectasis. Electronically Signed   By: Lavonia Dana M.D.   On: 03/27/2021 15:20   US Abdomen Limited RUQ (LIVER/GB)  Result Date: 03/27/2021 CLINICAL DATA:  Abdominal pain. EXAM: ULTRASOUND ABDOMEN LIMITED RIGHT UPPER QUADRANT COMPARISON:  CT abdomen and pelvis 03/23/2021 FINDINGS: Gallbladder: Distended gallbladder containing debris/sludge and tiny stones. Pericholecystic fluid. No generalized wall thickening. A positive sonographic Percell Miller sign was noted by the sonographer. Common bile duct: Diameter: 5 mm Liver: Suboptimally evaluated due to bowel gas. No gross mass identified. Portal vein is patent on color Doppler imaging with normal direction of blood flow towards the liver. Other: Right pleural effusion. IMPRESSION: Distended gallbladder containing debris/sludge and tiny stones with pericholecystic fluid and positive sonographic Murphy sign concerning for acute cholecystitis. Electronically Signed   By: Logan Bores M.D.   On: 03/27/2021 15:21    EKG: Independently reviewed. NSR w/(known) RBBB  Assessment/Plan Principal Problem:   Acute cholecystitis Active Problems:   Type 2 diabetes mellitus with renal manifestations (HCC)   Essential hypertension   HLD (hyperlipidemia)   ESRD (end stage renal disease) (HCC)   Macrocytic anemia   RBBB   Blindness   S/P TAVR (transcatheter aortic valve replacement)   Anemia in chronic kidney disease   Secondary hyperparathyroidism of renal origin ([place])   PAD (peripheral artery disease) ([place])   DNR (do not resuscitate)   Acute cholecystitis: Characteristic U/S findings with +Murphy's sign on exam. Abd tender but not currently peritonitic. Nonobstructive LFTs. - Vanc/zosyn given initially. With  strong suspicion for source, will DC vancomycin for now. Check blood cultures - IR consulted, recommend HIDA +/- replacement  of cholecystostomy tube. NPO. - General surgery consulted.  - Dilaudid prn pain, zofran prn nausea/vomiting, thorazine prn hiccups.   Paroxysmal atrial fibrillation: Currently NSR.  - Not on anticoagulation at this time, will not start. IR requests avoidance of anticoagulant/antiplatelet agents.  - Low dose metoprolol tartrate continued to avoid RVR. - Will monitor on telemetry for first 24 hours.  NIDT2DM: HbA1c 7%.  - SSI q4h  Diabetic neuropathy:  - Continue gabapentin at low dose. If hiccups continue after addressing gallbladder, consider DC'ing this medication.   Glaucoma, blindness: Noted - Continue gtt's  Parkinson's disease:  - Continue sinemet  ESRD: Missed HD today (MWF schedule, last was Friday, 1/20), does not appear grossly overloaded, had been discharged below prior EDW. Metabolically stable without uremic symptoms.  - Notified nephrology of admission without need for urgent HD.  PVD: s/p left BKA. No critical limb ischemia at this time.  - Hold plavix per IR as above.   Recent acute urinary retention: Discharged with foley. Specimen from indwelling bag taken at admission.  - Continue foley.  Macrocytic anemia, anemia of ESRD: Hgb above previous values. Still no active bleeding.   - Monitor in AM.  Pleural effusions: Persistent, noted on imaging during previous admission. No respiratory symptoms at this time.  - Flutter valve, incentive spirometry.   DVT prophylaxis: SCD to RLE  Code Status: DNR (POA)  Family Communication: Daughter by phone Disposition Plan: Return to SNF Consults called: General surgery and IR and GI consulted by EDP  Admission status: Observation    Patrecia Pour, MD [facility] [email or web address] 03/27/2021, 6:15 PM

## 2021-03-27 NOTE — Progress Notes (Signed)
Request received for possible intervention of acute cholecystitis.   Pt afebrile, leukocytosis slightly worse than yesterday 15.7 (12.6 yesterday.) Case reviewed with Dr. Annamaria Boots, recommends NM HIDA scan for further evaluation.  Will review HIDA scan tomorrow AM and decide if pt needs perc chole with IR.   Ordering MD notified recommendation/plan via secure chat.   Pt is NPO Please refrain from placing pt on AC/AP if possible.  No INR since 01/2020, will update.   Please call IR for questions and concerns.  Armando Gang Nickolaos Brallier PA-C 03/27/2021 4:42 PM

## 2021-03-27 NOTE — ED Notes (Signed)
First set of blood cultures were drawn with IV start, prior to IV abx

## 2021-03-28 ENCOUNTER — Encounter (HOSPITAL_COMMUNITY): Payer: Self-pay | Admitting: Family Medicine

## 2021-03-28 ENCOUNTER — Observation Stay (HOSPITAL_COMMUNITY): Payer: Medicare Other

## 2021-03-28 ENCOUNTER — Inpatient Hospital Stay (HOSPITAL_COMMUNITY): Payer: Medicare Other

## 2021-03-28 DIAGNOSIS — N2581 Secondary hyperparathyroidism of renal origin: Secondary | ICD-10-CM | POA: Diagnosis present

## 2021-03-28 DIAGNOSIS — Z20822 Contact with and (suspected) exposure to covid-19: Secondary | ICD-10-CM | POA: Diagnosis present

## 2021-03-28 DIAGNOSIS — E1122 Type 2 diabetes mellitus with diabetic chronic kidney disease: Secondary | ICD-10-CM | POA: Diagnosis present

## 2021-03-28 DIAGNOSIS — E1151 Type 2 diabetes mellitus with diabetic peripheral angiopathy without gangrene: Secondary | ICD-10-CM | POA: Diagnosis present

## 2021-03-28 DIAGNOSIS — E1140 Type 2 diabetes mellitus with diabetic neuropathy, unspecified: Secondary | ICD-10-CM | POA: Diagnosis present

## 2021-03-28 DIAGNOSIS — N186 End stage renal disease: Secondary | ICD-10-CM | POA: Diagnosis present

## 2021-03-28 DIAGNOSIS — E871 Hypo-osmolality and hyponatremia: Secondary | ICD-10-CM | POA: Diagnosis present

## 2021-03-28 DIAGNOSIS — I4510 Unspecified right bundle-branch block: Secondary | ICD-10-CM | POA: Diagnosis present

## 2021-03-28 DIAGNOSIS — Z952 Presence of prosthetic heart valve: Secondary | ICD-10-CM | POA: Diagnosis not present

## 2021-03-28 DIAGNOSIS — J90 Pleural effusion, not elsewhere classified: Secondary | ICD-10-CM | POA: Diagnosis present

## 2021-03-28 DIAGNOSIS — I2510 Atherosclerotic heart disease of native coronary artery without angina pectoris: Secondary | ICD-10-CM | POA: Diagnosis present

## 2021-03-28 DIAGNOSIS — G20 Parkinson's disease: Secondary | ICD-10-CM | POA: Diagnosis present

## 2021-03-28 DIAGNOSIS — J9811 Atelectasis: Secondary | ICD-10-CM | POA: Diagnosis present

## 2021-03-28 DIAGNOSIS — K810 Acute cholecystitis: Secondary | ICD-10-CM | POA: Diagnosis present

## 2021-03-28 DIAGNOSIS — J189 Pneumonia, unspecified organism: Secondary | ICD-10-CM | POA: Diagnosis not present

## 2021-03-28 DIAGNOSIS — R112 Nausea with vomiting, unspecified: Secondary | ICD-10-CM | POA: Diagnosis not present

## 2021-03-28 DIAGNOSIS — L89152 Pressure ulcer of sacral region, stage 2: Secondary | ICD-10-CM | POA: Diagnosis present

## 2021-03-28 DIAGNOSIS — E8809 Other disorders of plasma-protein metabolism, not elsewhere classified: Secondary | ICD-10-CM | POA: Diagnosis not present

## 2021-03-28 DIAGNOSIS — K8000 Calculus of gallbladder with acute cholecystitis without obstruction: Secondary | ICD-10-CM | POA: Diagnosis present

## 2021-03-28 DIAGNOSIS — D631 Anemia in chronic kidney disease: Secondary | ICD-10-CM | POA: Diagnosis present

## 2021-03-28 DIAGNOSIS — H548 Legal blindness, as defined in USA: Secondary | ICD-10-CM | POA: Diagnosis present

## 2021-03-28 DIAGNOSIS — L8990 Pressure ulcer of unspecified site, unspecified stage: Secondary | ICD-10-CM | POA: Insufficient documentation

## 2021-03-28 DIAGNOSIS — I120 Hypertensive chronic kidney disease with stage 5 chronic kidney disease or end stage renal disease: Secondary | ICD-10-CM | POA: Diagnosis present

## 2021-03-28 DIAGNOSIS — E876 Hypokalemia: Secondary | ICD-10-CM | POA: Diagnosis not present

## 2021-03-28 DIAGNOSIS — Z66 Do not resuscitate: Secondary | ICD-10-CM | POA: Diagnosis present

## 2021-03-28 DIAGNOSIS — Z992 Dependence on renal dialysis: Secondary | ICD-10-CM | POA: Diagnosis not present

## 2021-03-28 HISTORY — PX: IR PERC CHOLECYSTOSTOMY: IMG2326

## 2021-03-28 LAB — COMPREHENSIVE METABOLIC PANEL
ALT: 8 U/L (ref 0–44)
AST: 122 U/L — ABNORMAL HIGH (ref 15–41)
Albumin: 1.7 g/dL — ABNORMAL LOW (ref 3.5–5.0)
Alkaline Phosphatase: 409 U/L — ABNORMAL HIGH (ref 38–126)
Anion gap: 9 (ref 5–15)
BUN: 37 mg/dL — ABNORMAL HIGH (ref 8–23)
CO2: 25 mmol/L (ref 22–32)
Calcium: 8.2 mg/dL — ABNORMAL LOW (ref 8.9–10.3)
Chloride: 94 mmol/L — ABNORMAL LOW (ref 98–111)
Creatinine, Ser: 6.13 mg/dL — ABNORMAL HIGH (ref 0.61–1.24)
GFR, Estimated: 9 mL/min — ABNORMAL LOW (ref >60)
Glucose, Bld: 92 mg/dL (ref 70–99)
Potassium: 3.9 mmol/L (ref 3.5–5.1)
Sodium: 128 mmol/L — ABNORMAL LOW (ref 135–145)
Total Bilirubin: 0.9 mg/dL (ref 0.3–1.2)
Total Protein: 5.3 g/dL — ABNORMAL LOW (ref 6.5–8.1)

## 2021-03-28 LAB — CBC
HCT: 24.7 % — ABNORMAL LOW (ref 39.0–52.0)
Hemoglobin: 8.2 g/dL — ABNORMAL LOW (ref 13.0–17.0)
MCH: 33.7 pg (ref 26.0–34.0)
MCHC: 33.2 g/dL (ref 30.0–36.0)
MCV: 101.6 fL — ABNORMAL HIGH (ref 80.0–100.0)
Platelets: 438 10*3/uL — ABNORMAL HIGH (ref 150–400)
RBC: 2.43 MIL/uL — ABNORMAL LOW (ref 4.22–5.81)
RDW: 14.3 % (ref 11.5–15.5)
WBC: 13.7 10*3/uL — ABNORMAL HIGH (ref 4.0–10.5)
nRBC: 0 % (ref 0.0–0.2)

## 2021-03-28 LAB — PROTIME-INR
INR: 1.2 (ref 0.8–1.2)
Prothrombin Time: 15.2 seconds (ref 11.4–15.2)

## 2021-03-28 LAB — CBG MONITORING, ED
Glucose-Capillary: 115 mg/dL — ABNORMAL HIGH (ref 70–99)
Glucose-Capillary: 64 mg/dL — ABNORMAL LOW (ref 70–99)
Glucose-Capillary: 89 mg/dL (ref 70–99)
Glucose-Capillary: 89 mg/dL (ref 70–99)

## 2021-03-28 LAB — GLUCOSE, CAPILLARY
Glucose-Capillary: 64 mg/dL — ABNORMAL LOW (ref 70–99)
Glucose-Capillary: 67 mg/dL — ABNORMAL LOW (ref 70–99)
Glucose-Capillary: 72 mg/dL (ref 70–99)
Glucose-Capillary: 78 mg/dL (ref 70–99)
Glucose-Capillary: 87 mg/dL (ref 70–99)

## 2021-03-28 LAB — PHOSPHORUS: Phosphorus: 4.9 mg/dL — ABNORMAL HIGH (ref 2.5–4.6)

## 2021-03-28 MED ORDER — MIDAZOLAM HCL 2 MG/2ML IJ SOLN
INTRAMUSCULAR | Status: AC
  Filled 2021-03-28: qty 2

## 2021-03-28 MED ORDER — DEXTROSE 50 % IV SOLN
12.5000 g | Freq: Once | INTRAVENOUS | Status: AC
  Administered 2021-03-28: 12.5 g via INTRAVENOUS
  Filled 2021-03-28: qty 50

## 2021-03-28 MED ORDER — FENTANYL CITRATE (PF) 100 MCG/2ML IJ SOLN
INTRAMUSCULAR | Status: AC
  Filled 2021-03-28: qty 2

## 2021-03-28 MED ORDER — MORPHINE SULFATE (PF) 4 MG/ML IV SOLN: 2.5000 mg | Freq: Once | INTRAVENOUS | Status: DC

## 2021-03-28 MED ORDER — CHLORHEXIDINE GLUCONATE CLOTH 2 % EX PADS
6.0000 | MEDICATED_PAD | Freq: Every day | CUTANEOUS | Status: DC
  Administered 2021-03-29 – 2021-04-03 (×5): 6 via TOPICAL

## 2021-03-28 MED ORDER — FENTANYL CITRATE (PF) 100 MCG/2ML IJ SOLN
INTRAMUSCULAR | Status: AC | PRN
  Administered 2021-03-28: 25 ug via INTRAVENOUS

## 2021-03-28 MED ORDER — LIDOCAINE HCL 1 % IJ SOLN
INTRAMUSCULAR | Status: AC
  Filled 2021-03-28: qty 20

## 2021-03-28 MED ORDER — DARBEPOETIN ALFA 40 MCG/0.4ML IJ SOSY
40.0000 ug | PREFILLED_SYRINGE | INTRAMUSCULAR | Status: DC
  Administered 2021-03-29 – 2021-04-05 (×2): 40 ug via INTRAVENOUS
  Filled 2021-03-28 (×4): qty 0.4

## 2021-03-28 MED ORDER — DOXERCALCIFEROL 4 MCG/2ML IV SOLN
3.0000 ug | INTRAVENOUS | Status: DC
  Administered 2021-03-31 – 2021-04-05 (×3): 3 ug via INTRAVENOUS
  Filled 2021-03-28 (×7): qty 2

## 2021-03-28 MED ORDER — CINACALCET HCL 30 MG PO TABS
60.0000 mg | ORAL_TABLET | ORAL | Status: DC
  Administered 2021-04-03 – 2021-04-05 (×2): 60 mg via ORAL
  Filled 2021-03-28 (×7): qty 2

## 2021-03-28 MED ORDER — CHLORHEXIDINE GLUCONATE CLOTH 2 % EX PADS
6.0000 | MEDICATED_PAD | Freq: Every day | CUTANEOUS | Status: DC
  Administered 2021-03-28 – 2021-04-01 (×5): 6 via TOPICAL

## 2021-03-28 MED ORDER — SODIUM CHLORIDE 0.9% FLUSH
5.0000 mL | Freq: Three times a day (TID) | INTRAVENOUS | Status: DC
  Administered 2021-03-28 – 2021-04-05 (×21): 5 mL

## 2021-03-28 MED ORDER — MORPHINE SULFATE (PF) 4 MG/ML IV SOLN
INTRAVENOUS | Status: AC
  Filled 2021-03-28: qty 1

## 2021-03-28 MED ORDER — SODIUM CHLORIDE 0.9 % IV SOLN
2.0000 g | Freq: Once | INTRAVENOUS | Status: AC
  Administered 2021-03-28: 17:00:00 2 g via INTRAVENOUS
  Filled 2021-03-28: qty 2

## 2021-03-28 MED ORDER — IOHEXOL 300 MG/ML  SOLN
100.0000 mL | Freq: Once | INTRAMUSCULAR | Status: AC | PRN
Start: 2021-03-28 — End: 2021-03-28
  Administered 2021-03-28: 17:00:00 10 mL

## 2021-03-28 MED ORDER — LIDOCAINE HCL 1 % IJ SOLN
INTRAMUSCULAR | Status: AC | PRN
  Administered 2021-03-28: 10 mL via INTRADERMAL

## 2021-03-28 MED ORDER — MIDAZOLAM HCL 2 MG/2ML IJ SOLN
INTRAMUSCULAR | Status: AC | PRN
  Administered 2021-03-28: 1 mg via INTRAVENOUS

## 2021-03-28 MED ORDER — TECHNETIUM TC 99M MEBROFENIN IV KIT
5.3000 | PACK | Freq: Once | INTRAVENOUS | Status: AC | PRN
  Administered 2021-03-28: 13:00:00 5.3 via INTRAVENOUS

## 2021-03-28 NOTE — ED Notes (Signed)
Daughter [name] [phone] would like an update asap

## 2021-03-28 NOTE — Progress Notes (Signed)
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  She was recently admitted from 1/11-03/2020 for acute cholecystitis managed with percutaneous cholecystectomy tube which was unintentionally dislodged and subsequently removed on 1/19 and was discharged with oral antibiotics.  After being discharged to nursing facility, she developed severe abdominal pain with associated nausea and vomiting.  On presentation she was afebrile, lab work showed worsening leukocytosis.  Right upper quadrant ultrasound showed distended gallbladder with cholelithiasis, sludge, pericholecystic fluid.  Murphy sign was positive.  IR, general surgery consulted.  Assessment & Plan:   Principal Problem:   Acute cholecystitis Active Problems:   Type 2 diabetes mellitus with renal manifestations (HCC)   Essential hypertension   HLD (hyperlipidemia)   ESRD (end stage renal disease) (HCC)   Macrocytic anemia   RBBB   Blindness   S/P TAVR (transcatheter aortic valve replacement)   Anemia in chronic kidney disease   Secondary hyperparathyroidism of renal origin ([place])   PAD (peripheral artery disease) ([place])   DNR (do not resuscitate)   Acute cholecystitis: She was recently admitted from 1/11-03/2020 for acute cholecystitis managed with percutaneous cholecystectomy tube which was unintentionally dislodged and subsequently removed on 1/19 and was discharged with oral antibiotics.  After being discharged to nursing facility, she developed severe abdominal pain with associated nausea and  vomiting.  On presentation she was afebrile, lab work showed worsening leukocytosis.  Right upper quadrant ultrasound showed distended gallbladder with cholelithiasis, sludge, pericholecystic fluid.  Murphy sign was positive.  IR, general surgery consulted.  HIDA scan has been ordered. Continue pain management supportive care.  Blood cultures will be followed.  Continue current antibiotics.  Leukocytosis will be monitored  ESRD: On dialysis on MWF.  Nephrology consulted for dialysis  Bilateral pleural effusion: Moderate to large RIGHT and small LEFT pleural effusions with associated RIGHT basilar atelectasis as per CXR.  This finding was also present on last admission.  No respiratory symptoms.  Currently on room air.  We will check if he is a candidate for thoracentesis.  Volume management with dialysis  Paroxysmal A. fib: Currently in normal sinus rhythm.  Not on anticoagulation.  On low-dose metoprolol for rate control.  Monitor on telemetry  Non-insulin-dependent diabetes type 2: Recent A1c of 7%.  Monitor blood sugars.  Currently on sliding scale insulin.  On gabapentin for diabetic neuropathy.  History of glaucoma: Continue eyedrops  Parkinson's disease: Continue Sinemet.  Delirium precautions  History of peripheral vascular disease : status post left BKA.  Takes Plavix which is on hold for upcoming possible procedures for cholecystitis  History of urinary retention: On last admission Foley was placed and he was discharged with Foley.  Continue Foley catheter for now.  We recommend to follow-up with urology as an outpatient  Macrocytic anemia: Associated with ESRD.  No evidence of bleeding.  Hemoglobin stable  Hyponatremia: Likely from volume overload.  Continue monitoring.  Follow management as per dialysis             DVT prophylaxis:SCD Code Status: DNR Family Communication:: Discussed with daughter on phone on 1/24 Patient status: Observation  Dispo: The patient is from:  Skilled nursing facility              Anticipated d/c is to: Skilled nursing facility              Anticipated d/c date is: 2-3 days  Consultants: IR, general surgery, nephrology  Procedures: None  Antimicrobials:  Anti-infectives (From admission, onward)    Start     Dose/Rate Route Frequency Ordered Stop   03/27/21 1645  vancomycin (VANCOREADY) IVPB 1500 mg/300 mL        1,500 mg 150 mL/hr over 120 Minutes Intravenous  Once 03/27/21 1632 03/27/21 1942   03/27/21 1630  piperacillin-tazobactam (ZOSYN) IVPB 2.25 g        2.25 g 100 mL/hr over 30 Minutes Intravenous Every 8 hours 03/27/21 1617         Subjective: Patient seen and examined at the bedside this morning.  Hemodynamically stable.  Lying on bed.  Denies any worsening abdominal pain, nausea or vomiting.  Looks comfortable.  Alert and oriented.  Objective: Vitals:   03/28/21 0000 03/28/21 0300 03/28/21 0400 03/28/21 0632  BP: (!) 128/51 117/63 133/82 (!) 152/58  Pulse: 63 (!) 58 94 73  Resp: 11 13 15 16   Temp:      TempSrc:      SpO2: 97% 99% 98% 98%    Intake/Output Summary (Last 24 hours) at 03/28/2021 0802 Last data filed at 03/28/2021 0730 Gross per 24 hour  Intake 701.94 ml  Output --  Net 701.94 ml   There were no vitals filed for this visit.  Examination:  General exam: Very deconditioned, chronically ill looking, overall comfortable HEENT: Legally blind Respiratory system:  no wheezes or crackles  Cardiovascular system: S1 & S2 heard, RRR.  Gastrointestinal system: Abdomen is nondistended, soft .  Tenderness on deep palpation on right upper quadrant Central nervous system: Alert and oriented Extremities: No edema, no clubbing ,no cyanosis, AV fistula on the left upper extremity Skin: No rashes, no ulcers,no icterus , left BKA    Data Reviewed: I have personally reviewed following labs and imaging studies  CBC: Recent Labs  Lab 03/24/21 0630 03/25/21 0420 03/26/21 0346 03/27/21 1143  03/28/21 0319  WBC 12.0* 11.5* 12.6* 15.7* 13.7*  HGB 9.1* 9.4* 8.7* 11.4* 8.2*  HCT 25.9* 27.3* 25.1* 33.7* 24.7*  MCV 101.2* 101.5* 101.6* 103.1* 101.6*  PLT 411* 396 403* 490* 742*   Basic Metabolic Panel: Recent Labs  Lab 03/21/21 0840 03/22/21 0639 03/26/21 0346 03/27/21 1143 03/28/21 0319  NA 136 136 132* 131* 128*  K 3.3* 3.7 4.0 4.6 3.9  CL 98 99 96* 93* 94*  CO2 25 24 26 22 25   GLUCOSE 133* 116* 104* 123* 92  BUN 36* 53* 25* 35* 37*  CREATININE 4.34* 5.46* 4.18* 5.60* 6.13*  CALCIUM 8.3* 8.6* 8.2* 8.8* 8.2*  PHOS 2.5 3.3  --   --   --    GFR: Estimated Creatinine Clearance: 8.1 mL/min (A) (by C-G formula based on SCr of 6.13 mg/dL (H)). Liver Function Tests: Recent Labs  Lab 03/21/21 0840 03/22/21 0639 03/26/21 0346 03/27/21 1143 03/28/21 0319  AST  --   --  18 35 122*  ALT  --   --  6 6 8   ALKPHOS  --   --  135* 191* 409*  BILITOT  --   --  0.7 1.0 0.9  PROT  --   --  5.3* 6.8 5.3*  ALBUMIN 1.6* 1.7* 1.7*  2.4* 1.7*   Recent Labs  Lab 03/27/21 1143  LIPASE 30   No results for input(s): AMMONIA in the last 168 hours. Coagulation Profile: Recent Labs  Lab 03/28/21 0319  INR 1.2   Cardiac Enzymes: No results for input(s): CKTOTAL, CKMB, CKMBINDEX, TROPONINI in the last 168 hours. BNP (last 3 results) No results for input(s): PROBNP in the last 8760 hours. HbA1C: No results for input(s): HGBA1C in the last 72 hours. CBG: Recent Labs  Lab 03/27/21 1940 03/28/21 0002 03/28/21 0039 03/28/21 0318 03/28/21 0748  GLUCAP 92 64* 115* 89 89   Lipid Profile: No results for input(s): CHOL, HDL, LDLCALC, TRIG, CHOLHDL, LDLDIRECT in the last 72 hours. Thyroid Function Tests: No results for input(s): TSH, T4TOTAL, FREET4, T3FREE, THYROIDAB in the last 72 hours. Anemia Panel: No results for input(s): VITAMINB12, FOLATE, FERRITIN, TIBC, IRON, RETICCTPCT in the last 72 hours. Sepsis Labs: No results for input(s): PROCALCITON, LATICACIDVEN in the last  168 hours.  Recent Results (from the past 240 hour(s))  Urine Culture     Status: None   Collection Time: 03/18/21 10:30 AM   Specimen: In/Out Cath Urine  Result Value Ref Range Status   Specimen Description IN/OUT CATH URINE  Final   Special Requests NONE  Final   Culture   Final    NO GROWTH Performed at [facility], [address] [place], [place] [address]    Report Status 03/19/2021 FINAL  Final  Resp Panel by RT-PCR (Flu A&B, Covid) Nasopharyngeal Swab     Status: None   Collection Time: 03/22/21 12:42 PM   Specimen: Nasopharyngeal Swab; Nasopharyngeal(NP) swabs in vial transport medium  Result Value Ref Range Status   SARS Coronavirus 2 by RT PCR NEGATIVE NEGATIVE Final    Comment: (NOTE) SARS-CoV-2 target nucleic acids are NOT DETECTED.  The SARS-CoV-2 RNA is generally detectable in upper respiratory specimens during the acute phase of infection. The lowest concentration of SARS-CoV-2 viral copies this assay can detect is 138 copies/mL. A negative result does not preclude SARS-Cov-2 infection and should not be used as the sole basis for treatment or other patient management decisions. A negative result may occur with  improper specimen collection/handling, submission of specimen other than nasopharyngeal swab, presence of viral mutation(s) within the areas targeted by this assay, and inadequate number of viral copies(<138 copies/mL). A negative result must be combined with clinical observations, patient history, and epidemiological information. The expected result is Negative.  Fact Sheet for Patients:  EntrepreneurPulse.com.au  Fact Sheet for Healthcare Providers:  IncredibleEmployment.be  This test is no t yet approved or cleared by the [place] FDA and  has been authorized for detection and/or diagnosis of SARS-CoV-2 by FDA under an Emergency Use Authorization (EUA). This EUA will remain  in effect (meaning this  test can be used) for the duration of the COVID-19 declaration under Section 564(b)(1) of the Act, 21 U.S.C.section 360bbb-3(b)(1), unless the authorization is terminated  or revoked sooner.       Influenza A by PCR NEGATIVE NEGATIVE Final   Influenza B by PCR NEGATIVE NEGATIVE Final    Comment: (NOTE) The Xpert Xpress SARS-CoV-2/FLU/RSV plus assay is intended as an aid in the diagnosis of influenza from Nasopharyngeal swab specimens and should not be used as a sole basis for treatment. Nasal washings and aspirates are unacceptable for Xpert Xpress SARS-CoV-2/FLU/RSV testing.  Fact Sheet for Patients: EntrepreneurPulse.com.au  Fact Sheet for Healthcare Providers: IncredibleEmployment.be  This test is not yet  approved or cleared by the [place] and has been authorized for detection and/or diagnosis of SARS-CoV-2 by FDA under an Emergency Use Authorization (EUA). This EUA will remain in effect (meaning this test can be used) for the duration of the COVID-19 declaration under Section 564(b)(1) of the Act, 21 U.S.C. section 360bbb-3(b)(1), unless the authorization is terminated or revoked.  Performed at [facility], [place] [address] [place], [place] [address]   Resp Panel by RT-PCR (Flu A&B, Covid) Nasopharyngeal Swab     Status: None   Collection Time: 03/26/21 11:56 AM   Specimen: Nasopharyngeal Swab; Nasopharyngeal(NP) swabs in vial transport medium  Result Value Ref Range Status   SARS Coronavirus 2 by RT PCR NEGATIVE NEGATIVE Final    Comment: (NOTE) SARS-CoV-2 target nucleic acids are NOT DETECTED.  The SARS-CoV-2 RNA is generally detectable in upper respiratory specimens during the acute phase of infection. The lowest concentration of SARS-CoV-2 viral copies this assay can detect is 138 copies/mL. A negative result does not preclude SARS-Cov-2 infection and should not be used as the sole basis for treatment or other  patient management decisions. A negative result may occur with  improper specimen collection/handling, submission of specimen other than nasopharyngeal swab, presence of viral mutation(s) within the areas targeted by this assay, and inadequate number of viral copies(<138 copies/mL). A negative result must be combined with clinical observations, patient history, and epidemiological information. The expected result is Negative.  Fact Sheet for Patients:  EntrepreneurPulse.com.au  Fact Sheet for Healthcare Providers:  IncredibleEmployment.be  This test is no t yet approved or cleared by the [place] FDA and  has been authorized for detection and/or diagnosis of SARS-CoV-2 by FDA under an Emergency Use Authorization (EUA). This EUA will remain  in effect (meaning this test can be used) for the duration of the COVID-19 declaration under Section 564(b)(1) of the Act, 21 U.S.C.section 360bbb-3(b)(1), unless the authorization is terminated  or revoked sooner.       Influenza A by PCR NEGATIVE NEGATIVE Final   Influenza B by PCR NEGATIVE NEGATIVE Final    Comment: (NOTE) The Xpert Xpress SARS-CoV-2/FLU/RSV plus assay is intended as an aid in the diagnosis of influenza from Nasopharyngeal swab specimens and should not be used as a sole basis for treatment. Nasal washings and aspirates are unacceptable for Xpert Xpress SARS-CoV-2/FLU/RSV testing.  Fact Sheet for Patients: EntrepreneurPulse.com.au  Fact Sheet for Healthcare Providers: IncredibleEmployment.be  This test is not yet approved or cleared by the [place] FDA and has been authorized for detection and/or diagnosis of SARS-CoV-2 by FDA under an Emergency Use Authorization (EUA). This EUA will remain in effect (meaning this test can be used) for the duration of the COVID-19 declaration under Section 564(b)(1) of the Act, 21 U.S.C. section  360bbb-3(b)(1), unless the authorization is terminated or revoked.  Performed at [facility], [place] [address], [place], [place] [address]   Resp Panel by RT-PCR (Flu A&B, Covid) Nasopharyngeal Swab     Status: None   Collection Time: 03/27/21  2:10 PM   Specimen: Nasopharyngeal Swab; Nasopharyngeal(NP) swabs in vial transport medium  Result Value Ref Range Status   SARS Coronavirus 2 by RT PCR NEGATIVE NEGATIVE Final    Comment: (NOTE) SARS-CoV-2 target nucleic acids are NOT DETECTED.  The SARS-CoV-2 RNA is generally detectable in upper respiratory specimens during the acute phase of infection. The lowest concentration of SARS-CoV-2 viral copies this assay can detect is 138 copies/mL. A negative  result does not preclude SARS-Cov-2 infection and should not be used as the sole basis for treatment or other patient management decisions. A negative result may occur with  improper specimen collection/handling, submission of specimen other than nasopharyngeal swab, presence of viral mutation(s) within the areas targeted by this assay, and inadequate number of viral copies(<138 copies/mL). A negative result must be combined with clinical observations, patient history, and epidemiological information. The expected result is Negative.  Fact Sheet for Patients:  EntrepreneurPulse.com.au  Fact Sheet for Healthcare Providers:  IncredibleEmployment.be  This test is no t yet approved or cleared by the [place] FDA and  has been authorized for detection and/or diagnosis of SARS-CoV-2 by FDA under an Emergency Use Authorization (EUA). This EUA will remain  in effect (meaning this test can be used) for the duration of the COVID-19 declaration under Section 564(b)(1) of the Act, 21 U.S.C.section 360bbb-3(b)(1), unless the authorization is terminated  or revoked sooner.       Influenza A by PCR NEGATIVE NEGATIVE Final   Influenza B by PCR NEGATIVE  NEGATIVE Final    Comment: (NOTE) The Xpert Xpress SARS-CoV-2/FLU/RSV plus assay is intended as an aid in the diagnosis of influenza from Nasopharyngeal swab specimens and should not be used as a sole basis for treatment. Nasal washings and aspirates are unacceptable for Xpert Xpress SARS-CoV-2/FLU/RSV testing.  Fact Sheet for Patients: EntrepreneurPulse.com.au  Fact Sheet for Healthcare Providers: IncredibleEmployment.be  This test is not yet approved or cleared by the [place] FDA and has been authorized for detection and/or diagnosis of SARS-CoV-2 by FDA under an Emergency Use Authorization (EUA). This EUA will remain in effect (meaning this test can be used) for the duration of the COVID-19 declaration under Section 564(b)(1) of the Act, 21 U.S.C. section 360bbb-3(b)(1), unless the authorization is terminated or revoked.  Performed at [facility], [place] [address], [place], [place] [address]   Culture, blood (routine x 2)     Status: None (Preliminary result)   Collection Time: 03/27/21  4:03 PM   Specimen: BLOOD  Result Value Ref Range Status   Specimen Description BLOOD BLOOD RIGHT ARM  Final   Special Requests   Final    BOTTLES DRAWN AEROBIC AND ANAEROBIC Blood Culture results may not be optimal due to an excessive volume of blood received in culture bottles   Culture   Final    NO GROWTH < 12 HOURS Performed at [facility], [place] [address] [place], [place] [address]    Report Status PENDING  Incomplete  Culture, blood (routine x 2)     Status: None (Preliminary result)   Collection Time: 03/27/21  6:49 PM   Specimen: BLOOD RIGHT HAND  Result Value Ref Range Status   Specimen Description BLOOD RIGHT HAND  Final   Special Requests   Final    BOTTLES DRAWN AEROBIC AND ANAEROBIC Blood Culture adequate volume   Culture   Final    NO GROWTH < 12 HOURS Performed at [facility], [place] [address] [place],  [place] [address]    Report Status PENDING  Incomplete         Radiology Studies: DG Chest 2 View  Result Date: 03/27/2021 CLINICAL DATA:  Assess, abdominal pain, nausea and vomiting since yesterday, recent hospitalization for cholecystitis and complicated UTI EXAM: CHEST - 2 VIEW COMPARISON:  03/17/2021 FINDINGS: Normal heart size post TAVR. Mediastinal contours and pulmonary vascularity normal. Moderate to large RIGHT pleural effusion with  small LEFT pleural effusion. Mild RIGHT basilar atelectasis and central peribronchial thickening. Upper lungs clear. No pneumothorax or acute osseous findings. IMPRESSION: Moderate to large RIGHT and small LEFT pleural effusions with associated RIGHT basilar atelectasis. Electronically Signed   By: Lavonia Dana M.D.   On: 03/27/2021 15:20   US Abdomen Limited RUQ (LIVER/GB)  Result Date: 03/27/2021 CLINICAL DATA:  Abdominal pain. EXAM: ULTRASOUND ABDOMEN LIMITED RIGHT UPPER QUADRANT COMPARISON:  CT abdomen and pelvis 03/23/2021 FINDINGS: Gallbladder: Distended gallbladder containing debris/sludge and tiny stones. Pericholecystic fluid. No generalized wall thickening. A positive sonographic Percell Miller sign was noted by the sonographer. Common bile duct: Diameter: 5 mm Liver: Suboptimally evaluated due to bowel gas. No gross mass identified. Portal vein is patent on color Doppler imaging with normal direction of blood flow towards the liver. Other: Right pleural effusion. IMPRESSION: Distended gallbladder containing debris/sludge and tiny stones with pericholecystic fluid and positive sonographic Murphy sign concerning for acute cholecystitis. Electronically Signed   By: Logan Bores M.D.   On: 03/27/2021 15:21        Scheduled Meds:  brimonidine  1 drop Right Eye TID   carbidopa-levodopa  1 tablet Oral TID   dorzolamide-timolol  1 drop Right Eye BID   gabapentin  100 mg Oral Daily   insulin aspart  0-6 Units Subcutaneous Q4H   metoprolol tartrate  12.5 mg Oral BID    Netarsudil-Latanoprost  1 drop Right Eye QHS   pilocarpine  1 drop Right Eye QID   Continuous Infusions:  chlorproMAZINE (THORAZINE) IV     piperacillin-tazobactam (ZOSYN)  IV Stopped (03/28/21 0730)     LOS: 0 days       Shelly Coss, MD [facility] P1/24/2023, 8:02 AM

## 2021-03-28 NOTE — Progress Notes (Signed)
This nurse assuming care, pt awake, alert, on room air. C/O  pain 2-3/10, repositioned for comfort, pt verbalizes relief.  Pt oriented to unit and room environment. NPO maintained.  No acute distress noted.  Bed low call light in reach.  Will continue to monitor.

## 2021-03-28 NOTE — Progress Notes (Signed)
Hypoglycemic Event  CBG: 67  Treatment: 8 oz juice/soda  Symptoms: None  Follow-up CBG: [id number] CBG Result:72  Possible Reasons for Event: Inadequate meal intake  Comments/MD notified:no    Reeves Forth

## 2021-03-28 NOTE — Progress Notes (Signed)
[facility] Progress Note   Interim Summary: pt w/ ESRD on HD, blindness, DNR, sp TAVR, CAD, L BKA, DM2 was admitted 1/11 for acute cholecystitis rx'd w/ perc chole tube (placed 1/12 - 1/17 removed), also had new afib and urine retention. Pt was dc'd on 1/22 to complete po abx course. He now has worsening abd pain whole abdomen, worse in RUQ and n/v. WBC 15k in ED. U/S showed distended GB, +stones and sludge, +murphy's sign. IR and gen surg consulted, HIDA recommended. Asked to see for ESRD.    Subjective:   pt seen in ED, he is drowsy but fully alert when questioned. Mostly oriented, no distress.   Objective Vitals:   03/28/21 7867 03/28/21 0900 03/28/21 0955 03/28/21 1028  BP: (!) 152/58 (!) 121/56  (!) 147/51  Pulse: 73 63  65  Resp: 16 12  18   Temp:    (!) 97.5 F (36.4 C)  TempSrc:      SpO2: 98% 100%  100%  Weight:   68 kg   Height:   5\' 5"  (1.651 m)    Physical Exam General: Blind, lying on stretcher, NAD Heart: Normal S1 and S2; No murmurs, gallops, or rubs Lungs: Clear anteriorly and laterally Abdomen: Soft and non-tender Extremities: 1+ pitting edema bilat LEs Dialysis Access: L AVF (+) Bruit/Thrill     Dialysis Orders: [facility] MWF  4h  400/600  66kg 2/2 bath  P2  AVF  Hep none  - sensipar 60 mg po tiw  - hectorol 3 ug tiw IV  - mircera 75 ug q 2 wks, just ordered  Assessment/Plan: Abdominal pain: recent admit for acute cholecystitis, S/P perc chole tube by IR 1/12. Tube pulled out 1/17.  Back now w/ abd pain. HIDA pending. Per pmd.  Atrial fib - recent onset, per pmd  Acute urinary retention/UTI/hydronephrosis. Urology consulted, placed foley ESRD - on HD MWF. Missed HD yesterday. Labs okay. Next HD tomorrow.  BP/volume  - BP's up, was down to 62 kg at the lowest on recent admit. +some vol overload by exam today. UF goal 3 L w/ HD tomorrow.  Anemia ckd  - Hb 8.2, started ESA last admit darbe 40 weekly, will continue.  Metabolic bone disease -   changed auryxia to Renvela binders 2 ac last admit, check phos. Continue sensipar and vdra. Ca in range.  Nutrition - Albumin <2. Protein supps ordered. Renal diet.  Parkinson's disease-per primary DMT2-per primary H/O TAVR DNR   Kelly Splinter, MD 03/28/2021, 11:20 AM       Filed Weights   03/28/21 0955  Weight: 68 kg    Intake/Output Summary (Last 24 hours) at 03/28/2021 1051 Last data filed at 03/28/2021 0730 Gross per 24 hour  Intake 701.94 ml  Output --  Net 701.94 ml    Additional Objective Labs: Basic Metabolic Panel: Recent Labs  Lab 03/22/21 0639 03/26/21 0346 03/27/21 1143 03/28/21 0319  NA 136 132* 131* 128*  K 3.7 4.0 4.6 3.9  CL 99 96* 93* 94*  CO2 24 26 22 25   GLUCOSE 116* 104* 123* 92  BUN 53* 25* 35* 37*  CREATININE 5.46* 4.18* 5.60* 6.13*  CALCIUM 8.6* 8.2* 8.8* 8.2*  PHOS 3.3  --   --   --     Liver Function Tests: Recent Labs  Lab 03/26/21 0346 03/27/21 1143 03/28/21 0319  AST 18 35 122*  ALT 6 6 8   ALKPHOS 135* 191* 409*  BILITOT 0.7 1.0 0.9  PROT  5.3* 6.8 5.3*  ALBUMIN 1.7* 2.4* 1.7*    Recent Labs  Lab 03/27/21 1143  LIPASE 30   CBC: Recent Labs  Lab 03/24/21 0630 03/25/21 0420 03/26/21 0346 03/27/21 1143 03/28/21 0319  WBC 12.0* 11.5* 12.6* 15.7* 13.7*  HGB 9.1* 9.4* 8.7* 11.4* 8.2*  HCT 25.9* 27.3* 25.1* 33.7* 24.7*  MCV 101.2* 101.5* 101.6* 103.1* 101.6*  PLT 411* 396 403* 490* 438*    Blood Culture    Component Value Date/Time   SDES BLOOD RIGHT HAND 03/27/2021 1849   SPECREQUEST  03/27/2021 1849    BOTTLES DRAWN AEROBIC AND ANAEROBIC Blood Culture adequate volume   CULT  03/27/2021 1849    NO GROWTH < 12 HOURS Performed at [facility], [place] [address] [place], [place] [address]    REPTSTATUS PENDING 03/27/2021 1849    Cardiac Enzymes: No results for input(s): CKTOTAL, CKMB, CKMBINDEX, TROPONINI in the last 168 hours. CBG: Recent Labs  Lab 03/27/21 1940 03/28/21 0002 03/28/21 0039  03/28/21 0318 03/28/21 0748  GLUCAP 92 64* 115* 89 89    Iron Studies:  No results for input(s): IRON, TIBC, TRANSFERRIN, FERRITIN in the last 72 hours.  Lab Results  Component Value Date   INR 1.2 03/28/2021   INR 1.0 01/14/2020   INR 1.0 11/26/2019    Medications:  chlorproMAZINE (THORAZINE) IV     piperacillin-tazobactam (ZOSYN)  IV Stopped (03/28/21 0730)    brimonidine  1 drop Right Eye TID   carbidopa-levodopa  1 tablet Oral TID   Chlorhexidine Gluconate Cloth  6 each Topical Daily   dorzolamide-timolol  1 drop Right Eye BID   gabapentin  100 mg Oral Daily   insulin aspart  0-6 Units Subcutaneous Q4H   metoprolol tartrate  12.5 mg Oral BID   Netarsudil-Latanoprost  1 drop Right Eye QHS   pilocarpine  1 drop Right Eye QID

## 2021-03-28 NOTE — Consult Note (Signed)
Chief Complaint: Acute cholecystitis. Request is for cholecystomy tube placement  Referring Physician(s): Dr. Mathews Robinsons  Supervising Physician: Corrie Mckusick  Patient Status: [facility] - In-pt  History of Present Illness: Elijah White is a 81 y.o. male inpatient.. History of ESRD on HD, AS, status post TAVR, CAD, blindness, PVD status post BKA, DM, HTN, HLD. Presented to the ED at [facility] with abdominal pain,nausea, vomiting and hiccups. Found to have acute cholecystitis. IR placed a cholecystomy tube on 1.12.29. The tube was pulled out and on 1.19.23 IR attempted to replace the cholecystomy tube on 1.19.23 but the tract was closed at that time. Patient presents for cholecystomy tube placement.    Patient alert and laying in bed, calm and comfortable. Endorses hiccups. Denies any fevers, headache, chest pain, SOB, cough, abdominal pain, nausea, vomiting or bleeding. Return precautions and treatment recommendations and follow-up discussed with the patient  who is agreeable with the plan.    Past Medical History:  Diagnosis Date   Anemia    low iron   Aortic stenosis    s/p TAVR 01/19/20   Arthritis    Cancer [facility])    prostate, s/p I-125 seed implant 05/18/05   Colon polyps ~ 1993 and 2003   Dr [name], [facility].  08/2001 colonoscopy: tubular adenoma at cecum.     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   Elevated cholesterol with high triglycerides    ESRD (end stage renal disease) (HCC)    [employer] [facility] [employer]   GERD (gastroesophageal reflux disease)    Glaucoma    Hypertension    Legally blind in left eye, as defined in [place]    has pinpoint vision in right eye   PAD (peripheral artery disease) ([place])    RBBB     Past Surgical History:  Procedure Laterality Date   ABDOMINAL AORTOGRAM W/LOWER EXTREMITY N/A 07/25/2020   Procedure: ABDOMINAL AORTOGRAM W/LOWER EXTREMITY;  Surgeon: Marty Heck, MD;  Location: [place] CV LAB;  Service:  Cardiovascular;  Laterality: N/A;   AMPUTATION Left 08/12/2020   Procedure: LEFT BELOW KNEE AMPUTATION;  Surgeon: Marty Heck, MD;  Location: [place];  Service: Vascular;  Laterality: Left;   AV FISTULA PLACEMENT Left 03/20/2018   Procedure: ARTERIOVENOUS (AV) FISTULA CREATION ARM;  Surgeon: [name], MD;  Location: [place];  Service: Vascular;  Laterality: Left;   [place] Left 05/21/2018   Procedure: LEFT BASILIC VEIN FISTULA SECOND STAGE;  Surgeon: [name], MD;  Location: [place];  Service: Vascular;  Laterality: Left;   BIOPSY  03/25/2021   Procedure: BIOPSY;  Surgeon: Otis Brace, MD;  Location: [place] ENDOSCOPY;  Service: Gastroenterology;;   COLONOSCOPY     COLONOSCOPY WITH PROPOFOL N/A 11/30/2019   Procedure: COLONOSCOPY WITH PROPOFOL;  Surgeon: Ronnette Juniper, MD;  Location: [place];  Service: Gastroenterology;  Laterality: N/A;   ESOPHAGOGASTRODUODENOSCOPY (EGD) WITH PROPOFOL N/A 03/25/2021   Procedure: ESOPHAGOGASTRODUODENOSCOPY (EGD) WITH PROPOFOL;  Surgeon: Otis Brace, MD;  Location: [place];  Service: Gastroenterology;  Laterality: N/A;   GLAUCOMA SURGERY  2019   multiple surgeries   HEMOSTASIS CLIP PLACEMENT  11/30/2019   Procedure: HEMOSTASIS CLIP PLACEMENT;  Surgeon: Ronnette Juniper, MD;  Location: [place];  Service: Gastroenterology;;   IR CHOLANGIOGRAM EXISTING TUBE  03/21/2021   IR PERC CHOLECYSTOSTOMY  03/16/2021   IR REMOVAL BILIARY DRAIN  03/21/2021   KNEE ARTHROSCOPY     MULTIPLE EXTRACTIONS  WITH ALVEOLOPLASTY N/A 12/30/2019   Procedure: MULTIPLE EXTRACTION WITH ALVEOLOPLASTY;  Surgeon: Sharman Cheek, DMD;  Location: [facility];  Service: Dentistry;  Laterality: N/A;   PERIPHERAL VASCULAR INTERVENTION Left 07/25/2020   Procedure: PERIPHERAL VASCULAR INTERVENTION;  Surgeon: Cephus Shelling, MD;  Location: [facility] INVAS[facility] CV LAB;  Service: Cardiovascular;  Laterality: Left;  superficial femoral    POLYPECTOMY  11/30/2019   Procedure: POLYPECTOMY;  Surgeon: Kerin Salen, MD;  Location: [facility] ENDOSCOPY;  Service: Gastroenterology;;   PROSTATE SURGERY     RIGHT/LEFT HEART CATH AND CORONARY ANGIOGRAPHY N/A 12/03/2019   Procedure: RIGHT/LEFT HEART CATH AND CORONARY ANGIOGRAPHY;  Surgeon: Kathleene Hazel, MD;  Location: [facility];  Service: Cardiovascular;  Laterality: N/A;   SUBMUCOSAL TATTOO INJECTION  11/30/2019   Procedure: SUBMUCOSAL TATTOO INJECTION;  Surgeon: Kerin Salen, MD;  Location: [facility] ENDOSCOPY;  Service: Gastroenterology;;   TEE WITHOUT CARDIOVERSION N/A 01/19/2020   Procedure: TRANSESOPHAGEAL ECHOCARDIOGRAM (TEE);  Surgeon: Kathleene Hazel, MD;  Location: [facility] INVAS[facility] CV LAB;  Service: Open Heart Surgery;  Laterality: N/A;   TRANSCATHETER AORTIC VALVE REPLACEMENT, TRANSFEMORAL Left 01/19/2020   Procedure: TRANSCATHETER AORTIC VALVE REPLACEMENT, LEFT TRANSFEMORAL;  Surgeon: Kathleene Hazel, MD;  [facility]: [facility];  Service: Open Heart Surgery;  Laterality: Left;    Allergies: Patient has no known allergies.  Medications: Prior to Admission medications   Medication Sig Start Date End Date Taking? Authorizing Provider  acetaminophen (TYLENOL) 500 MG tablet Take 1,000 mg by mouth every 6 (six) hours as needed for moderate pain or headache.   Yes [provider]  atorvastatin (LIPITOR) 10 MG tablet Take 1 tablet (10 mg total) by mouth in the morning. 08/16/20  Yes Rhyne, Samantha J, PA-C  B Complex-C-Folic Acid (DIALYVITE 800) 0.8 MG TABS Take 1 tablet by mouth in the morning. 05/08/19  Yes [provider]  brimonidine (ALPHAGAN) 0.15 % ophthalmic solution Place 1 drop into the right eye 3 (three) times daily.   Yes [provider]  carbidopa-levodopa (SINEMET) 25-100 MG tablet Take 1 tablet by mouth 3 (three) times daily. Patient taking differently: Take 1 tablet by mouth 3 (three) times daily. 1 TABLET @ 8am/ 12pm/ 4pm  10/19/20 05/13/21 Yes Curatolo, Adam, DO  cefdinir (OMNICEF) 300 MG capsule Take 1 capsule (300 mg total) by mouth every Monday, Wednesday, and Friday at 6 PM for 5 days. Patient taking differently: Take 300 mg by mouth See admin instructions. Monday,Wednesday and Friday at 6 pm x 5 days 03/24/21 03/29/21 Yes Albertine Grates, MD  clopidogrel (PLAVIX) 75 MG tablet Take 1 tablet (75 mg total) by mouth daily with breakfast. 07/25/20  Yes Cephus Shelling, MD  docusate sodium (COLACE) 100 MG capsule Take 100 mg by mouth 2 (two) times daily.   Yes [provider]  dorzolamide-timolol (COSOPT) 22.3-6.8 MG/ML ophthalmic solution Place 1 drop into the right eye 2 (two) times daily. 06/25/17  Yes [provider]  ferric citrate (AURYXIA) 1 GM 210 MG(Fe) tablet Take 420 mg by mouth 3 (three) times daily with meals.   Yes [provider]  gabapentin (NEURONTIN) 100 MG capsule Take 1 capsule (100 mg total) by mouth daily. 10/19/20 05/13/21 Yes Curatolo, Adam, DO  hydroxypropyl methylcellulose / hypromellose (GONIOSOFT) 2.5 % ophthalmic solution Place 1 drop into the left eye 2 (two) times daily as needed for dry eyes.   Yes [provider]  Melatonin 5 MG CAPS Take 5 mg by mouth at bedtime.  Yes [provider]  metoprolol tartrate (LOPRESSOR) 25 MG tablet Take 0.5 tablets (12.5 mg total) by mouth 2 (two) times daily. Hold if sbp less than 100 03/26/21 03/26/22 Yes Florencia Reasons, MD  nystatin (MYCOSTATIN) 100000 UNIT/ML suspension Take 5 mLs (500,000 Units total) by mouth 4 (four) times daily. 03/26/21  Yes Florencia Reasons, MD  pantoprazole (PROTONIX) 40 MG tablet Take 1 tablet (40 mg total) by mouth daily at 12 noon. 08/16/20  Yes Rhyne, Hulen Shouts, PA-C  pilocarpine (PILOCAR) 4 % ophthalmic solution Place 1 drop into the right eye 4 (four) times daily.    Yes [provider]  polyethylene glycol (MIRALAX / GLYCOLAX) 17 g packet Take 17 g by mouth daily. 03/26/21  Yes Florencia Reasons, MD   ROCKLATAN 0.02-0.005 % SOLN Place 1 drop into the right eye at bedtime. 06/20/20  Yes [provider]  senna-docusate (SENOKOT-S) 8.6-50 MG tablet Take 1 tablet by mouth at bedtime. 03/26/21  Yes Florencia Reasons, MD  sevelamer carbonate (RENVELA) 800 MG tablet Take 2 tablets (1,600 mg total) by mouth 3 (three) times daily with meals. 03/26/21  Yes Florencia Reasons, MD  Sodium Phosphates (RA SALINE ENEMA RE) Place 1 enema rectally daily as needed (for constipation not relieved by bisacodyl).   Yes [provider]  blood glucose meter kit and supplies KIT Dispense based on patient and insurance preference. Use up to four times daily as directed. (FOR ICD-9 250.00, 250.01). For QAC - HS accuchecks. 03/30/18   Thurnell Lose, MD  Doxercalciferol (HECTOROL IV) Doxercalciferol (Hectorol) 08/09/20 08/28/21  [provider]  Methoxy PEG-Epoetin Beta (MIRCERA IJ) Mircera 09/05/20 09/04/21  [provider]  ondansetron (ZOFRAN) 4 MG tablet Take 1 tablet (4 mg total) by mouth daily as needed for nausea or vomiting. Patient not taking: Reported on 03/27/2021 03/26/21   Florencia Reasons, MD  [facility] VERIO test strip USE AS DIRECTED TO TEST FOUR TIMES A DAY 07/31/18   [name], MD     Family History  Problem Relation Age of Onset   CAD Mother     Social History   Socioeconomic History   Marital status: Widowed    Spouse name: Not on file   Number of children: Not on file   Years of education: Not on file   Highest education level: Not on file  Occupational History   Occupation: retired  Tobacco Use   Smoking status: Former    Packs/day: 0.50    Types: Cigarettes    Quit date: 03/23/2018    Years since quitting: 3.0   Smokeless tobacco: Never  Vaping Use   Vaping Use: Never used  Substance and Sexual Activity   Alcohol use: No   Drug use: No   Sexual activity: Not Currently  Other Topics Concern   Not on file  Social History Narrative   Right handed    Facility   Social  Determinants of Health   Financial Resource Strain: Low Risk    Difficulty of Paying Living Expenses: Not hard at all  Food Insecurity: No Food Insecurity   Worried About Charity fundraiser in the Last Year: Never true   [place] in the Last Year: Never true  Transportation Needs: No Transportation Needs   Lack of Transportation (Medical): No   Lack of Transportation (Non-Medical): No  Physical Activity: Inactive   Days of Exercise per Week: 0 days   Minutes of Exercise per Session: 0 min  Stress: No  Stress Concern Present   Feeling of Stress : Not at all  Social Connections: Not on file    Review of Systems: A 12 point ROS discussed and pertinent positives are indicated in the HPI above.  All other systems are negative.  Review of Systems  Constitutional:  Negative for fever.  HENT:  Negative for congestion.   Respiratory:  Negative for cough and shortness of breath.        + hiccups  Cardiovascular:  Negative for chest pain.  Gastrointestinal:  Negative for abdominal pain.  Neurological:  Negative for headaches.  Psychiatric/Behavioral:  Negative for behavioral problems and confusion.    Vital Signs: BP (!) 147/51 (BP Location: Right Arm)    Pulse 65    Temp (!) 97.5 F (36.4 C)    Resp 18    Ht $R'5\' 5"'Df$  (1.651 m)    Wt 150 lb (68 kg)    SpO2 100%    BMI 24.96 kg/m   Physical Exam Vitals and nursing note reviewed.  Constitutional:      Appearance: He is well-developed.  HENT:     Head: Normocephalic.  Cardiovascular:     Rate and Rhythm: Normal rate and regular rhythm.     Comments: LUE AVF Pulmonary:     Effort: Pulmonary effort is normal.     Breath sounds: Normal breath sounds.  Musculoskeletal:        General: Normal range of motion.     Cervical back: Normal range of motion.  Skin:    General: Skin is dry.  Neurological:     Mental Status: He is alert and oriented to person, place, and time.    Imaging: CT ABDOMEN PELVIS WO CONTRAST  Result Date:  03/23/2021 CLINICAL DATA:  Postoperative RIGHT upper quadrant pain, headache ups, fall in hemoglobin, post percutaneous cholecystostomy on 03/16/2021 with removal of biliary drain on 03/21/2021 EXAM: CT ABDOMEN AND PELVIS WITHOUT CONTRAST TECHNIQUE: Multidetector CT imaging of the abdomen and pelvis was performed following the standard protocol without IV contrast. RADIATION DOSE REDUCTION: This exam was performed according to the departmental dose-optimization program which includes automated exposure control, adjustment of the mA and/or kV according to patient size and/or use of iterative reconstruction technique. COMPARISON:  03/17/2021 FINDINGS: Lower chest: Small BILATERAL pleural effusions, RIGHT greater than LEFT. Significant atelectasis of RIGHT lower lobe. Minimal compressive atelectasis posterior LEFT lower lobe. Coronary arterial calcifications noted with evidence of TAVR. Hepatobiliary: Interval removal of cholecystostomy tube. Dependent calculi within gallbladder with additional new intermediate attenuation material within gallbladder lumen likely representing blood. Gallbladder remains distended. Liver unremarkable. Pancreas: Normal appearance Spleen: Normal appearance.  Small splenule adjacent to spleen. Adrenals/Urinary Tract: 2.5 x 2.1 cm diameter homogeneous LEFT adrenal mass, 32 HU attenuation on noncontrast imaging, probable adrenal adenoma. RIGHT adrenal gland unremarkable. No renal mass or hydronephrosis. Foley catheter decompresses urinary bladder. No ureteral dilatation. Stomach/Bowel: Normal appendix. Stomach and bowel loops normal appearance. Vascular/Lymphatic: Atherosclerotic calcifications aorta and iliac arteries. No adenopathy. Reproductive: Seed implants at prostate gland. Other: Small amount of low-attenuation free fluid in pelvis and perihepatic. No free air. Small umbilical hernia containing fat. Musculoskeletal: Bones demineralized. IMPRESSION: Interval removal of cholecystostomy  tube. Persistent gallbladder distention with dependent calculi and additional intermediate attenuation material within gallbladder lumen likely representing blood/hemorrhage. Small amount of nonspecific low-attenuation free intraperitoneal fluid perihepatic and in pelvis. 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 Small BILATERAL pleural effusions and atelectasis greater on RIGHT. Small umbilical hernia containing fat. Aortic  Atherosclerosis (ICD10-I70.0). Electronically Signed   By: Lavonia Dana M.D.   On: 03/23/2021 13:34   DG Chest 1 View  Result Date: 03/15/2021 CLINICAL DATA:  Abdominal pain and vomiting. Clinical concern for aspiration. EXAM: CHEST  1 VIEW COMPARISON:  06/24/2020 FINDINGS: Normal sized heart. Interval minimal patchy density at the right lung base. The left lung remains clear. Aortic valve stent. Thoracic spine degenerative changes. Left axillary surgical clip. IMPRESSION: Minimal right basilar atelectasis, pneumonia or aspiration pneumonitis. Electronically Signed   By: Claudie Revering M.D.   On: 03/15/2021 14:07   DG Chest 2 View  Result Date: 03/27/2021 CLINICAL DATA:  Assess, abdominal pain, nausea and vomiting since yesterday, recent hospitalization for cholecystitis and complicated UTI EXAM: CHEST - 2 VIEW COMPARISON:  03/17/2021 FINDINGS: Normal heart size post TAVR. Mediastinal contours and pulmonary vascularity normal. Moderate to large RIGHT pleural effusion with small LEFT pleural effusion. Mild RIGHT basilar atelectasis and central peribronchial thickening. Upper lungs clear. No pneumothorax or acute osseous findings. IMPRESSION: Moderate to large RIGHT and small LEFT pleural effusions with associated RIGHT basilar atelectasis. Electronically Signed   By: Lavonia Dana M.D.   On: 03/27/2021 15:20   CT ABDOMEN PELVIS W CONTRAST  Result Date: 03/17/2021 CLINICAL DATA:  Acute generalized abdominal pain. EXAM: CT ABDOMEN AND PELVIS WITH CONTRAST TECHNIQUE: Multidetector CT  imaging of the abdomen and pelvis was performed using the standard protocol following bolus administration of intravenous contrast. RADIATION DOSE REDUCTION: This exam was performed according to the departmental dose-optimization program which includes automated exposure control, adjustment of the mA and/or kV according to patient size and/or use of iterative reconstruction technique. CONTRAST:  27mL OMNIPAQUE IOHEXOL 300 MG/ML  SOLN COMPARISON:  March 15, 2021. FINDINGS: Lower chest: Right lower lobe pneumonia or atelectasis is noted. Hepatobiliary: Interval placement of percutaneous cholecystostomy tube is noted. Continued gallbladder distention and cholelithiasis is noted. No biliary dilatation is noted. The liver is unremarkable. Pancreas: Unremarkable. No pancreatic ductal dilatation or surrounding inflammatory changes. Spleen: Normal in size without focal abnormality. Adrenals/Urinary Tract: Stable 2.6 cm left adrenal nodule is noted. Stable right adrenal gland enlargement is noted. Mild bilateral renal atrophy is noted. No nephrolithiasis is noted. Moderate right hydroureteronephrosis is noted without obstructing calculus. Stable dilatation of left renal pelvis is noted without calyceal dilatation. Mild left ureteral dilatation is noted. No obstructing calculus is noted. Mild urinary bladder distention is noted. Stomach/Bowel: Stomach is within normal limits. Appendix appears normal. No evidence of bowel wall thickening, distention, or inflammatory changes. Vascular/Lymphatic: Aortic atherosclerosis. No enlarged abdominal or pelvic lymph nodes. Reproductive: Status post prostatic brachytherapy seed placement. Other: No abdominal wall hernia or abnormality. No abdominopelvic ascites. Musculoskeletal: No acute or significant osseous findings. IMPRESSION: Interval development of right lower lobe opacity is noted concerning for pneumonia or atelectasis. Interval placement of percutaneous cholecystostomy tube in  grossly good position. Stable gallbladder distension and cholelithiasis is noted. Grossly stable moderate right hydroureteronephrosis is noted as well as prominent left renal pelvis and mild distal left ureteral dilatation, without evidence of obstructing calculus. Mild urinary bladder distention is noted. 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 Aortic Atherosclerosis (ICD10-I70.0). Electronically Signed   By: Marijo Conception M.D.   On: 03/17/2021 09:41   US Abdomen Limited  Result Date: 03/15/2021 CLINICAL DATA:  Right upper quadrant pain EXAM: ULTRASOUND ABDOMEN LIMITED RIGHT UPPER QUADRANT COMPARISON:  None. FINDINGS: Gallbladder: Distended with sludge and calculi measuring up to 4 mm. No wall thickening  visualized. No pericholecystic fluid. No sonographic Murphy sign noted by sonographer. Common bile duct: Diameter: 3 mm, normal Liver: Left lobe of liver is poorly visualized due to bowel gas. No focal lesion identified. Within normal limits in parenchymal echogenicity. Portal vein is patent on color Doppler imaging with normal direction of blood flow towards the liver. Other: None. IMPRESSION: Distended gallbladder with sludge and calculi but no additional sonographic evidence of acute cholecystitis. Electronically Signed   By: Macy Mis M.D.   On: 03/15/2021 11:40   IR Perc Cholecystostomy  Result Date: 03/16/2021 INDICATION: 81 year old male with right upper quadrant pain and imaging findings concerning for acute calculus cholecystitis. A nuclear medicine HIDA scan was ordered to confirm, however patient's clinical status has deteriorated overnight with significant increase in leukocytosis, new oxygen requirement and increasing pain. Therefore, we are proceeding directly with percutaneous transhepatic cholecystostomy tube placement. EXAM: CHOLECYSTOSTOMY MEDICATIONS: In patient currently on intravenous Zosyn. No additional antibiotic  prophylaxis administered. ANESTHESIA/SEDATION: Moderate (conscious) sedation was employed during this procedure. A total of Versed 0.5 mg and Fentanyl 25 mcg and 0.5 mg Dilaudid was administered intravenously. Moderate Sedation Time: 10 minutes. The patient's level of consciousness and vital signs were monitored continuously by radiology nursing throughout the procedure under my direct supervision. FLUOROSCOPY TIME:  Fluoroscopy Time: 0 minutes 36 seconds (2 mGy). COMPLICATIONS: None immediate. PROCEDURE: Informed written consent was obtained from the patient after a thorough discussion of the procedural risks, benefits and alternatives. All questions were addressed. Maximal Sterile Barrier Technique was utilized including caps, mask, sterile gowns, sterile gloves, sterile drape, hand hygiene and skin antiseptic. A timeout was performed prior to the initiation of the procedure. The right upper quadrant was interrogated with ultrasound. The gallbladder is markedly distended. A suitable skin entry site was selected and marked. The overlying skin was sterilely prepped and draped in the standard fashion using chlorhexidine skin prep. Local anesthesia was attained by infiltration with 1% lidocaine. A small dermatotomy was made. Under real-time ultrasound guidance, a 21 gauge Accustick needle was advanced along a short transhepatic course and into the gallbladder lumen. Images were obtained and stored for the medical record. A 0.018 wire was advanced in the gallbladder lumen. The needle was exchanged for the Accustick transitional dilator which was advanced over the wire and into the gallbladder. Aspiration yields dark black bile. A sample was sent for Gram stain and culture. A 0.035 wire was coiled in the gallbladder lumen. The skin tract was dilated to 10 [place]. A Cook 10.2 [place] all-purpose drainage catheter was advanced over the wire and formed in the gallbladder. The catheter was connected to gravity bag drainage  and secured to the skin with 0 Prolene suture. Overall, the patient tolerated the procedure well. IMPRESSION: Successful placement of 10 French percutaneous transhepatic cholecystostomy tube for the indication of acute calculus cholecystitis. Electronically Signed   By: Jacqulynn Cadet M.D.   On: 03/16/2021 14:07   DG CHEST PORT 1 VIEW  Result Date: 03/17/2021 CLINICAL DATA:  Chest abdominal pain.  Aspiration in airway. EXAM: PORTABLE CHEST 1 VIEW COMPARISON:  AP chest 03/16/2021 FINDINGS: The patient is mildly rightward rotated. Within this limitation, cardiac silhouette and mediastinal contours are grossly unchanged and within normal limits. Calcification is seen within the aortic arch. 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 The lungs appear clear. No pleural effusion or pneumothorax. No acute skeletal abnormality. IMPRESSION: No acute lung process. Electronically Signed   By: Yvonne Kendall M.D.   On: 03/17/2021 09:21  DG CHEST PORT 1 VIEW  Result Date: 03/16/2021 CLINICAL DATA:  Hypoxia EXAM: PORTABLE CHEST 1 VIEW COMPARISON:  Chest x-ray dated March 15, 2021 FINDINGS: Cardiac and mediastinal contours are unchanged within normal limits. Prior transcatheter aortic valve replacement. Interval resolution of right basilar lung opacity, favor resolved atelectasis. No focal consolidation. No large pleural effusion pneumothorax. IMPRESSION: Interval resolution of right basilar lung opacity, favor resolved atelectasis. Electronically Signed   By: Yetta Glassman M.D.   On: 03/16/2021 07:46   CT Renal Stone Study  Result Date: 03/15/2021 CLINICAL DATA:  Right abdominal pain. EXAM: CT ABDOMEN AND PELVIS WITHOUT CONTRAST TECHNIQUE: Multidetector CT imaging of the abdomen and pelvis was performed following the standard protocol without IV contrast. RADIATION DOSE REDUCTION: This exam was performed according to the departmental dose-optimization program which includes automated exposure control, adjustment  of the mA and/or kV according to patient size and/or use of iterative reconstruction technique. COMPARISON:  CTA abdomen and pelvis 12/01/2019 FINDINGS: Lower chest: Trace right pleural fluid is new from prior. Unchanged mild bibasilar bronchiectasis. Mild dependent right lower lobe ground-glass likely subsegmental atelectasis. A partially visualized ascending aortic endograft. Dense coronary artery calcifications are seen. No pericardial effusion. Lack of intra-articular fluid limits evaluation of the abdominal and pelvic organ parenchyma. The following findings are made within this limitation. Hepatobiliary: Smooth liver contours. No gross liver lesion is seen. The gallbladder is again moderately distended with numerous layering small gallstones, similar to prior. No definite gallbladder wall thickening. Pancreas: Unremarkable. No pancreatic ductal dilatation or surrounding inflammatory changes. Spleen: Normal in size without focal abnormality. Adrenals/Urinary Tract: Left adrenal 2.6 cm nodule is unchanged from 11/26/2019 with internal density of 15 Hounsfield units. Right adrenal 15 mm nodule is unchanged in size with internal density of 5 Hounsfield units suggesting a benign lipid rich adenoma. These both again are likely benign. Mild left-greater-than-right renal pelvis vascular calcifications. Mild bilateral renal cortical thinning is again seen. Bilateral moderate extrarenal pelves are again noted. There is again mild-to-moderate dilatation of the bilateral ureters diffusely down to the level of the ureteropelvic junction likely chronic. No obstructing renal stone is seen. No hydronephrosis. Within the limitations of lack of IV contrast, no contour deforming renal mass. The urinary bladder is grossly unremarkable. Stomach/Bowel: No bowel wall thickening. The terminal ileum is unremarkable. The appendix is within normal limits. No dilated loops of bowel to indicate bowel obstruction. Vascular/Lymphatic: No  abdominal aortic aneurysm. Dense vascular calcifications. No enlarged abdominal or pelvic lymph nodes. 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 Other: Small fat containing umbilical hernia, unchanged. Unchanged small fat containing left inguinal hernia. No free air or free fluid. Musculoskeletal: Mild multilevel degenerative disc changes. Old healed lateral right eleventh rib fracture unchanged. IMPRESSION: 1. No nephroureterolithiasis or hydronephrosis. 2. Cholelithiasis. 3. 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 4. Small fat containing umbilical and left inguinal hernias. 5. Trace right pleural effusion with mild subsegmental atelectasis. 6.  Aortic Atherosclerosis (ICD10-I70.0). Electronically Signed   By: Yvonne Kendall   On: 03/15/2021 11:14   IR CHOLANGIOGRAM EXISTING TUBE  Result Date: 03/23/2021 INDICATION: Cholelithiasis, status post percutaneous cholecystostomy catheter placement 03/16/2021. Difficulty flushing , no output. EXAM: CHOLANGIOGRAM THROUGH EXISTING CATHETER MEDICATIONS: None indicated ANESTHESIA/SEDATION: None required FLUOROSCOPY TIME:  6 seconds; 1 mGy COMPLICATIONS: None immediate. PROCEDURE: Informed written consent was obtained from the patient after a thorough discussion of the procedural risks, benefits and alternatives. All questions were addressed. Inspection of the right upper quadrant  shows significant retraction of the pigtail drain catheter since prior study. Injection shows the catheter is in the subcutaneous tissues. There is no filling of tract to the gallbladder, nor of the gallbladder itself. IMPRESSION: 1. The drain catheter had pulled out completely from the gallbladder with no opacification of the tract or lumen. The catheter was removed. PLAN: Observe clinically. If symptoms recur, patient may need new percutaneous cholecystostomy catheter placement. Electronically Signed Electronically  Signed   By: Lucrezia Europe M.D.   On: 03/23/2021 15:59   ECHOCARDIOGRAM LIMITED  Result Date: 03/17/2021    ECHOCARDIOGRAM LIMITED REPORT   Patient Name:   Elijah White Date of Exam: 03/17/2021 Medical Rec #:  [id number]       Height:       65.0 in Accession #:    [id number]      Weight:       143.3 lb Date of Birth:  Sep 12, 1940       BSA:          1.717 m Patient Age:    1 years        BP:           116/50 mmHg Patient Gender: M               HR:           114 bpm. Exam Location:  Inpatient Procedure: Cardiac Doppler, Color Doppler and Limited Echo                         STAT ECHO Reported to: Dr Gwyndolyn Kaufman on 03/17/2021 5:50:00 PM. Indications:    Hypotension  History:        Patient has no prior history of Echocardiogram examinations,                 most recent 01/05/2021.                 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  Sonographer:    Clayton Lefort RDCS (AE) Referring Phys: [id number] Darreld Mclean  Sonographer Comments: Image acquisition challenging due to respiratory motion. IMPRESSIONS  1. Left ventricular ejection fraction, by estimation, is 60 to 65%. The left ventricle has normal function. There is moderate concentric left ventricular hypertrophy.  2. Right ventricular systolic function is normal. The right ventricular size is normal.  3. The mitral valve is degenerative. Severe mitral annular calcification.  4. 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 Echo findings are consistent with normal structure and function of the aortic valve prosthesis. Aortic valve mean gradient measures 5.0 mmHg. Aortic valve Vmax measures 1.40 m/s. DI 0.8. There is no paravalvular leak.  5. The inferior vena cava is normal in size with <50% respiratory variability, suggesting right atrial pressure of 8 mmHg. Comparison(s): Compared to prior TTE on 01/2021,  there is no significant change. TAVR valve continues to be well seated with current gradient 10mmHg (previously 15mmHg). FINDINGS  Left Ventricle: Left ventricular ejection fraction, by estimation, is 60 to 65%. The left ventricle has normal function. The left ventricular internal cavity size was normal in size. There is moderate concentric left ventricular hypertrophy. Right Ventricle: The right ventricular size is normal. Right ventricular systolic function is  normal. Pericardium: There is no evidence of pericardial effusion. Mitral Valve: The mitral valve is degenerative in appearance. There is moderate thickening of the mitral valve leaflet(s). There is moderate calcification of the mitral valve leaflet(s). Severe mitral annular calcification. Tricuspid Valve: The tricuspid valve is normal in structure. Tricuspid valve regurgitation is trivial. Aortic Valve: DI 0.8. No PVL. The aortic valve has been repaired/replaced. Aortic valve mean gradient measures 5.0 mmHg. Aortic valve peak gradient measures 7.8 mmHg. Aortic valve area, by VTI measures 2.67 cm. 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 Echo findings are consistent with normal structure and function of the aortic valve prosthesis. Pulmonic Valve: The pulmonic valve was not well visualized. Pulmonic valve regurgitation is trivial. Aorta: The aortic root and ascending aorta are structurally normal, with no evidence of dilitation. Venous: The inferior vena cava is normal in size with less than 50% respiratory variability, suggesting right atrial pressure of 8 mmHg. LEFT VENTRICLE PLAX 2D LVIDd:         3.90 cm LVIDs:         3.00 cm LV PW:         1.30 cm LV IVS:        1.20 cm LVOT diam:     2.00 cm LV SV:         61 LV SV Index:   36 LVOT Area:     3.14 cm  LEFT ATRIUM           Index LA diam:      3.40 cm 1.98 cm/m LA Vol (A4C): 20.0 ml 11.65 ml/m  AORTIC VALVE AV Area (Vmax):    2.51 cm AV Area (Vmean):    2.53 cm AV Area (VTI):     2.67 cm AV Vmax:           140.00 cm/s AV Vmean:          104.000 cm/s AV VTI:            0.228 m AV Peak Grad:      7.8 mmHg AV Mean Grad:      5.0 mmHg LVOT Vmax:         112.00 cm/s LVOT Vmean:        83.900 cm/s LVOT VTI:          0.194 m LVOT/AV VTI ratio: 0.85  AORTA Ao Root diam: 2.50 cm Ao Asc diam:  2.60 cm  SHUNTS Systemic VTI:  0.19 m Systemic Diam: 2.00 cm Gwyndolyn Kaufman MD Electronically signed by Gwyndolyn Kaufman MD Signature Date/Time: 03/17/2021/6:28:04 PM    Final    [place] EKG SITE RITE  Result Date: 03/19/2021 If Site Rite image not attached, placement could not be confirmed due to current cardiac rhythm.  US Abdomen Limited RUQ (LIVER/GB)  Result Date: 03/27/2021 CLINICAL DATA:  Abdominal pain. EXAM: ULTRASOUND ABDOMEN LIMITED RIGHT UPPER QUADRANT COMPARISON:  CT abdomen and pelvis 03/23/2021 FINDINGS: Gallbladder: Distended gallbladder containing debris/sludge and tiny stones. Pericholecystic fluid. No generalized wall thickening. A positive sonographic Percell Miller sign was noted by the sonographer. Common bile duct: Diameter: 5 mm Liver: Suboptimally evaluated due to bowel gas. No gross mass identified. Portal vein is patent on color Doppler imaging with normal direction of blood flow towards the liver. Other: Right pleural effusion. IMPRESSION: Distended gallbladder containing debris/sludge and tiny stones with pericholecystic fluid and positive sonographic Murphy sign concerning for acute cholecystitis. Electronically Signed   By: Logan Bores  M.D.   On: 03/27/2021 15:21    Labs:  CBC: Recent Labs    03/25/21 0420 03/26/21 0346 03/27/21 1143 03/28/21 0319  WBC 11.5* 12.6* 15.7* 13.7*  HGB 9.4* 8.7* 11.4* 8.2*  HCT 27.3* 25.1* 33.7* 24.7*  PLT 396 403* 490* 438*    COAGS: Recent Labs    03/28/21 0319  INR 1.2    BMP: Recent Labs    03/22/21 0639 03/26/21 0346 03/27/21 1143 03/28/21 0319  NA 136 132* 131* 128*  K 3.7 4.0 4.6 3.9   CL 99 96* 93* 94*  CO2 $Re'24 26 22 25  'yGk$ GLUCOSE 116* 104* 123* 92  BUN 53* 25* 35* 37*  CALCIUM 8.6* 8.2* 8.8* 8.2*  CREATININE 5.46* 4.18* 5.60* 6.13*  GFRNONAA 10* 14* 10* 9*    LIVER FUNCTION TESTS: Recent Labs    03/18/21 0325 03/20/21 0600 03/22/21 0639 03/26/21 0346 03/27/21 1143 03/28/21 0319  BILITOT 1.0  --   --  0.7 1.0 0.9  AST 30  --   --  18 35 122*  ALT 7  --   --  $R'6 6 8  'rO$ ALKPHOS 79  --   --  135* 191* 409*  PROT 6.3*  --   --  5.3* 6.8 5.3*  ALBUMIN 2.0*   < > 1.7* 1.7* 2.4* 1.7*   < > = values in this interval not displayed.     Assessment and Plan:  81 y.o. male inpatient.. History of ESRD on HD, AS, status post TAVR, CAD, blindness, PVD status post BKA, DM, HTN, HLD. Presented to the ED at [facility] with abdominal pain,nausea, vomiting and hiccups. Found to have acute cholecystitis. IR placed a cholecystomy tube on 1.12.29. The tube was pulled out and on 1.19.23 IR attempted to replace the cholecystomy tube on 1.19.23 but the tract was closed at that time. Patient presents for cholecystomy tube placement.   WBC is 13.7, Sodium 128, alkaline phosphatase 409, albumin 1.7, AST 122, Total Protein 5.3.Hgb. patient is on plavix last does pior to admission. All other labs and medications are within acceptable parameters. NKDA. Patient has been NPO since midnight,   Risks and benefits discussed with the patient including, but not limited to bleeding, infection, gallbladder perforation, bile leak, sepsis or even death.  All of the patient's questions were answered, patient is agreeable to proceed. Consent signed and in chart.     Thank you for this interesting consult.  I greatly enjoyed meeting Elijah White and look forward to participating in their care.  A copy of this report was sent to the requesting provider on this date.  Electronically Signed: Jacqualine Mau, NP 03/28/2021, 3:58 PM   I spent a total of 40 Minutes    in face to face in clinical  consultation, greater than 50% of which was counseling/coordinating care for cholecstomy tube placement

## 2021-03-28 NOTE — ED Notes (Signed)
Updated Patients Daughter, [name] at this time.

## 2021-03-28 NOTE — Progress Notes (Signed)
Pt off unit to IR for Hida scan via uniot bed with transporters x1 at bedside. Tele removed for transport.

## 2021-03-28 NOTE — Progress Notes (Signed)
Progress Note     Subjective: 81 yo male known to general surgery service who was discharged from [facility] on 1/22 after admission with sepsis secondary to pyocystis and cholecystitis as well as new diagnosis of afib with RVR and RLL PNA vs atelectasis and urinary retention. During admission he had perc chole placed 1/12 for management of acute cholecystitis and planned to follow up with IR and Dr. Ninfa Linden outpatient. Perc chole was unintentionally dislodged/removed during admission on 1/19 and given no acute worsening opted to remain out. Patient was discharged to SNF on antibiotics. Patient states over the last 24 hours his abdominal pain, nausea, and hiccups have worsened prompting presentation to ED. He states he was able to eat some yesterday but pain has been worsening. He denies recent fever or chills. He denies respiratory complaints.   Objective: Vital signs in last 24 hours: Temp:  [98 F (36.7 C)-99.1 F (37.3 C)] 98 F (36.7 C) (01/23 2147) Pulse Rate:  [58-99] 73 (01/24 0632) Resp:  [11-20] 16 (01/24 2585) BP: (117-171)/(51-104) 152/58 (01/24 0632) SpO2:  [92 %-100 %] 98 % (01/24 2778)    Intake/Output from previous day: 01/23 0701 - 01/24 0700 In: 641.9 [IV Piggyback:641.9] Out: -  Intake/Output this shift: No intake/output data recorded.  PE: General: pleasant, WD, male who is laying in bed in NAD. hiccupping HEENT: head is normocephalic, atraumatic. Mouth is pink and moist Heart: regular, rate, and rhythm. No cyanosis Lungs:  Respiratory effort nonlabored on room air Abd: soft, ND, +BS, diffusely TTP greatest over RUQ and epigastrium.  MSK: no calf TTP or edema RLE. LLE amputation Skin: warm and dry Psych: A&Ox3 with an appropriate affect.    Lab Results:  Recent Labs    03/27/21 1143 03/28/21 0319  WBC 15.7* 13.7*  HGB 11.4* 8.2*  HCT 33.7* 24.7*  PLT 490* 438*   BMET Recent Labs    03/27/21 1143 03/28/21 0319  NA 131* 128*  K 4.6 3.9  CL 93* 94*   CO2 22 25  GLUCOSE 123* 92  BUN 35* 37*  CREATININE 5.60* 6.13*  CALCIUM 8.8* 8.2*   PT/INR Recent Labs    03/28/21 0319  LABPROT 15.2  INR 1.2   CMP     Component Value Date/Time   NA 128 (L) 03/28/2021 0319   NA 134 (A) 09/11/2019 0000   K 3.9 03/28/2021 0319   CL 94 (L) 03/28/2021 0319   CO2 25 03/28/2021 0319   GLUCOSE 92 03/28/2021 0319   BUN 37 (H) 03/28/2021 0319   BUN 34 (A) 09/11/2019 0000   CREATININE 6.13 (H) 03/28/2021 0319   CALCIUM 8.2 (L) 03/28/2021 0319   PROT 5.3 (L) 03/28/2021 0319   PROT 6.9 02/19/2018 1014   ALBUMIN 1.7 (L) 03/28/2021 0319   ALBUMIN 4.2 02/19/2018 1014   AST 122 (H) 03/28/2021 0319   ALT 8 03/28/2021 0319   ALKPHOS 409 (H) 03/28/2021 0319   BILITOT 0.9 03/28/2021 0319   BILITOT 0.5 02/19/2018 1014   GFRNONAA 9 (L) 03/28/2021 0319   GFRAA 7 (L) 12/03/2019 0710   Lipase     Component Value Date/Time   LIPASE 30 03/27/2021 1143       Studies/Results: DG Chest 2 View  Result Date: 03/27/2021 CLINICAL DATA:  Assess, abdominal pain, nausea and vomiting since yesterday, recent hospitalization for cholecystitis and complicated UTI EXAM: CHEST - 2 VIEW COMPARISON:  03/17/2021 FINDINGS: Normal heart size post TAVR. Mediastinal contours and pulmonary vascularity normal. Moderate to  large RIGHT pleural effusion with small LEFT pleural effusion. Mild RIGHT basilar atelectasis and central peribronchial thickening. Upper lungs clear. No pneumothorax or acute osseous findings. IMPRESSION: Moderate to large RIGHT and small LEFT pleural effusions with associated RIGHT basilar atelectasis. Electronically Signed   By: Lavonia Dana M.D.   On: 03/27/2021 15:20   US Abdomen Limited RUQ (LIVER/GB)  Result Date: 03/27/2021 CLINICAL DATA:  Abdominal pain. EXAM: ULTRASOUND ABDOMEN LIMITED RIGHT UPPER QUADRANT COMPARISON:  CT abdomen and pelvis 03/23/2021 FINDINGS: Gallbladder: Distended gallbladder containing debris/sludge and tiny stones.  Pericholecystic fluid. No generalized wall thickening. A positive sonographic Percell Miller sign was noted by the sonographer. Common bile duct: Diameter: 5 mm Liver: Suboptimally evaluated due to bowel gas. No gross mass identified. Portal vein is patent on color Doppler imaging with normal direction of blood flow towards the liver. Other: Right pleural effusion. IMPRESSION: Distended gallbladder containing debris/sludge and tiny stones with pericholecystic fluid and positive sonographic Murphy sign concerning for acute cholecystitis. Electronically Signed   By: Logan Bores M.D.   On: 03/27/2021 15:21    Anti-infectives: Anti-infectives (From admission, onward)    Start     Dose/Rate Route Frequency Ordered Stop   03/27/21 1645  vancomycin (VANCOREADY) IVPB 1500 mg/300 mL        1,500 mg 150 mL/hr over 120 Minutes Intravenous  Once 03/27/21 1632 03/27/21 1942   03/27/21 1630  piperacillin-tazobactam (ZOSYN) IVPB 2.25 g        2.25 g 100 mL/hr over 30 Minutes Intravenous Every 8 hours 03/27/21 1617          Assessment/Plan  Acute cholecystitis -  H/o of perc chole 1/12, removed 1/19 after becoming dislodged unintentionally - worsening abdominal pain and leukocytosis on admission at 15.7 (now 13.7) - blood cultures pending - IR has evaluated and planning on HIDA with possible perc chole pending results - agree with continued IV abx (was discharged on omnicef per ID on 1/22) - acute surgical intervention is not indicated at this time, we will continue to be available as needed.  FEN: NPO  ID: vanc, zosyn > VTE: okay for chemical prophylaxis from surgical perspective. Anticoagulation on hold for possible IR procedure  Per primary ESRD on dialysis Paroxysmal afib T2DM Parkinson's disease PVD Acute UR Pleural effusions  I reviewed Consultant (interventional radiology) notes, hospitalist notes, last 24 h vitals and pain scores, last 48 h intake and output, last 24 h labs and trends, and  last 24 h imaging results.  This care required moderate level of medical decision making.    LOS: 0 days   [facility] 03/28/2021, 7:46 AM Please see Amion for pager number during day hours 7:00am-4:30pm

## 2021-03-28 NOTE — Plan of Care (Signed)

## 2021-03-28 NOTE — Procedures (Signed)
Interventional Radiology Procedure Note  Procedure: Image guided drain placement, perc chole.  10F pigtail drain.  Complications: None  EBL: None Sample: Culture sent  Recommendations: - Routine drain care, with sterile flushes, record output - follow up Cx - routine wound care  Signed,  Timberlee Roblero S. Kathline Banbury, DO    

## 2021-03-29 ENCOUNTER — Inpatient Hospital Stay (HOSPITAL_COMMUNITY): Payer: Medicare Other

## 2021-03-29 ENCOUNTER — Ambulatory Visit (HOSPITAL_COMMUNITY): Payer: Medicare Other | Admitting: Physician Assistant

## 2021-03-29 LAB — CBC WITH DIFFERENTIAL/PLATELET
Abs Immature Granulocytes: 0.12 10*3/uL — ABNORMAL HIGH (ref 0.00–0.07)
Basophils Absolute: 0.1 10*3/uL (ref 0.0–0.1)
Basophils Relative: 0 %
Eosinophils Absolute: 0.2 10*3/uL (ref 0.0–0.5)
Eosinophils Relative: 1 %
HCT: 25.1 % — ABNORMAL LOW (ref 39.0–52.0)
Hemoglobin: 8.4 g/dL — ABNORMAL LOW (ref 13.0–17.0)
Immature Granulocytes: 1 %
Lymphocytes Relative: 5 %
Lymphs Abs: 0.7 10*3/uL (ref 0.7–4.0)
MCH: 33.7 pg (ref 26.0–34.0)
MCHC: 33.5 g/dL (ref 30.0–36.0)
MCV: 100.8 fL — ABNORMAL HIGH (ref 80.0–100.0)
Monocytes Absolute: 1.4 10*3/uL — ABNORMAL HIGH (ref 0.1–1.0)
Monocytes Relative: 10 %
Neutro Abs: 12 10*3/uL — ABNORMAL HIGH (ref 1.7–7.7)
Neutrophils Relative %: 83 %
Platelets: 427 10*3/uL — ABNORMAL HIGH (ref 150–400)
RBC: 2.49 MIL/uL — ABNORMAL LOW (ref 4.22–5.81)
RDW: 13.9 % (ref 11.5–15.5)
WBC: 14.4 10*3/uL — ABNORMAL HIGH (ref 4.0–10.5)
nRBC: 0 % (ref 0.0–0.2)

## 2021-03-29 LAB — GLUCOSE, CAPILLARY
Glucose-Capillary: 86 mg/dL (ref 70–99)
Glucose-Capillary: 80 mg/dL (ref 70–99)
Glucose-Capillary: 78 mg/dL (ref 70–99)
Glucose-Capillary: 74 mg/dL (ref 70–99)

## 2021-03-29 LAB — BASIC METABOLIC PANEL
Anion gap: 12 (ref 5–15)
BUN: 43 mg/dL — ABNORMAL HIGH (ref 8–23)
CO2: 21 mmol/L — ABNORMAL LOW (ref 22–32)
Calcium: 8.3 mg/dL — ABNORMAL LOW (ref 8.9–10.3)
Chloride: 95 mmol/L — ABNORMAL LOW (ref 98–111)
Creatinine, Ser: 7.04 mg/dL — ABNORMAL HIGH (ref 0.61–1.24)
GFR, Estimated: 7 mL/min — ABNORMAL LOW (ref >60)
Glucose, Bld: 84 mg/dL (ref 70–99)
Potassium: 4.2 mmol/L (ref 3.5–5.1)
Sodium: 128 mmol/L — ABNORMAL LOW (ref 135–145)

## 2021-03-29 LAB — SURGICAL PATHOLOGY

## 2021-03-29 MED ORDER — HEPARIN SODIUM (PORCINE) 5000 UNIT/ML IJ SOLN
5000.0000 [IU] | Freq: Three times a day (TID) | INTRAMUSCULAR | Status: DC
  Administered 2021-03-29 – 2021-04-05 (×22): 5000 [IU] via SUBCUTANEOUS
  Filled 2021-03-29 (×22): qty 1

## 2021-03-29 MED ORDER — CINACALCET HCL 30 MG PO TABS
60.0000 mg | ORAL_TABLET | Freq: Once | ORAL | Status: AC
  Administered 2021-03-29: 60 mg via ORAL
  Filled 2021-03-29: qty 2

## 2021-03-29 MED ORDER — OXYCODONE HCL 5 MG PO TABS
5.0000 mg | ORAL_TABLET | Freq: Four times a day (QID) | ORAL | Status: DC | PRN
  Administered 2021-04-01 – 2021-04-04 (×4): 5 mg via ORAL
  Filled 2021-03-29 (×4): qty 1

## 2021-03-29 MED ORDER — DOXERCALCIFEROL 4 MCG/2ML IV SOLN
3.0000 ug | Freq: Once | INTRAVENOUS | Status: AC
  Administered 2021-03-29: 3 ug via INTRAVENOUS
  Filled 2021-03-29: qty 2

## 2021-03-29 MED ORDER — PROCHLORPERAZINE EDISYLATE 10 MG/2ML IJ SOLN
10.0000 mg | Freq: Four times a day (QID) | INTRAMUSCULAR | Status: DC | PRN
  Administered 2021-03-29 – 2021-04-01 (×3): 10 mg via INTRAVENOUS
  Filled 2021-03-29 (×3): qty 2

## 2021-03-29 NOTE — Plan of Care (Signed)
°  Problem: Education: Goal: Knowledge of General Education information will improve Description: Including pain rating scale, medication(s)/side effects and non-pharmacologic comfort measures Outcome: Not Progressing   Problem: Health Behavior/Discharge Planning: Goal: Ability to manage health-related needs will improve Outcome: Not Progressing   Problem: Clinical Measurements: Goal: Ability to maintain clinical measurements within normal limits will improve Outcome: Not Progressing Goal: Will remain free from infection Outcome: Not Progressing Goal: Diagnostic test results will improve Outcome: Not Progressing Goal: Respiratory complications will improve Outcome: Not Progressing Goal: Cardiovascular complication will be avoided Outcome: Not Progressing   Problem: Activity: Goal: Risk for activity intolerance will decrease Outcome: Not Progressing   Problem: Nutrition: Goal: Adequate nutrition will be maintained Outcome: Not Progressing   Problem: Coping: Goal: Level of anxiety will decrease Outcome: Not Progressing   Problem: Elimination: Goal: Will not experience complications related to bowel motility Outcome: Not Progressing Goal: Will not experience complications related to urinary retention Outcome: Not Progressing   Problem: Pain Managment: Goal: General experience of comfort will improve Outcome: Not Progressing   Problem: Safety: Goal: Ability to remain free from injury will improve Outcome: Not Progressing   Problem: Skin Integrity: Goal: Risk for impaired skin integrity will decrease Outcome: Not Progressing   Problem: Education: Goal: Knowledge of General Education information will improve Description: Including pain rating scale, medication(s)/side effects and non-pharmacologic comfort measures Outcome: Not Progressing

## 2021-03-29 NOTE — Progress Notes (Signed)
Pt receives out-pt HD at [facility] on MWF. Pt arrives at 10:35 for 10:50 chair time. Will assist as needed.   Melven Sartorius Renal Navigator [phone]

## 2021-03-29 NOTE — Progress Notes (Signed)
Subjective: CC: Patient is s/p IR perc chole yesterday. He reports he had some vomiting after drinking grape juice post op. Currently in HD. He mainly complains of a pinching sensation at the location of his perc chole drain. The drain was wrapped up in the patients gown and being pulled. Once this was untangled he reports the pain resolved and he had no further abdominal pain. Drain with stitch still in place. He has drank some water this am. No current nausea.   Objective: Vital signs in last 24 hours: Temp:  [97.5 F (36.4 C)-98 F (36.7 C)] 97.7 F (36.5 C) (01/25 0739) Pulse Rate:  [65-88] 82 (01/25 0930) Resp:  [13-21] 16 (01/25 0756) BP: (86-152)/(47-68) 91/48 (01/25 0930) SpO2:  [97 %-100 %] 99 % (01/25 0756) Weight:  [68 kg-74.4 kg] 70.3 kg (01/25 0739) Last BM Date: 03/27/21  Intake/Output from previous day: 01/24 0701 - 01/25 0700 In: 60 [IV Piggyback:60] Out: 650 [Urine:650] Intake/Output this shift: No intake/output data recorded.  PE: Gen:  Alert, NAD, pleasant Abd: Soft, ND, very minimal tenderness of the epigastrium and RUQ around the perc chole drain. Perc chole drain in place w/ dark bloody/bilious fluid in the bag.    Lab Results:  Recent Labs    03/28/21 0319 03/29/21 0346  WBC 13.7* 14.4*  HGB 8.2* 8.4*  HCT 24.7* 25.1*  PLT 438* 427*   BMET Recent Labs    03/28/21 0319 03/29/21 0346  NA 128* 128*  K 3.9 4.2  CL 94* 95*  CO2 25 21*  GLUCOSE 92 84  BUN 37* 43*  CREATININE 6.13* 7.04*  CALCIUM 8.2* 8.3*   PT/INR Recent Labs    03/28/21 0319  LABPROT 15.2  INR 1.2   CMP     Component Value Date/Time   NA 128 (L) 03/29/2021 0346   NA 134 (A) 09/11/2019 0000   K 4.2 03/29/2021 0346   CL 95 (L) 03/29/2021 0346   CO2 21 (L) 03/29/2021 0346   GLUCOSE 84 03/29/2021 0346   BUN 43 (H) 03/29/2021 0346   BUN 34 (A) 09/11/2019 0000   CREATININE 7.04 (H) 03/29/2021 0346   CALCIUM 8.3 (L) 03/29/2021 0346   PROT 5.3 (L) 03/28/2021  0319   PROT 6.9 02/19/2018 1014   ALBUMIN 1.7 (L) 03/28/2021 0319   ALBUMIN 4.2 02/19/2018 1014   AST 122 (H) 03/28/2021 0319   ALT 8 03/28/2021 0319   ALKPHOS 409 (H) 03/28/2021 0319   BILITOT 0.9 03/28/2021 0319   BILITOT 0.5 02/19/2018 1014   GFRNONAA 7 (L) 03/29/2021 0346   GFRAA 7 (L) 12/03/2019 0710   Lipase     Component Value Date/Time   LIPASE 30 03/27/2021 1143    Studies/Results: DG Chest 2 View  Result Date: 03/27/2021 CLINICAL DATA:  Assess, abdominal pain, nausea and vomiting since yesterday, recent hospitalization for cholecystitis and complicated UTI EXAM: CHEST - 2 VIEW COMPARISON:  03/17/2021 FINDINGS: Normal heart size post TAVR. Mediastinal contours and pulmonary vascularity normal. Moderate to large RIGHT pleural effusion with small LEFT pleural effusion. Mild RIGHT basilar atelectasis and central peribronchial thickening. Upper lungs clear. No pneumothorax or acute osseous findings. IMPRESSION: Moderate to large RIGHT and small LEFT pleural effusions with associated RIGHT basilar atelectasis. Electronically Signed   By: Lavonia Dana M.D.   On: 03/27/2021 15:20   NM Hepatobiliary Liver Func  Result Date: 03/28/2021 CLINICAL DATA:  Ultrasound CT findings concern for acute cholecystitis. EXAM: NUCLEAR MEDICINE HEPATOBILIARY  IMAGING TECHNIQUE: Sequential images of the abdomen were obtained out to 60 minutes following intravenous administration of radiopharmaceutical. RADIOPHARMACEUTICALS:  5.3 mCi Tc-80m  Choletec IV COMPARISON:  Ultrasound 03/27/2021, CT 03/23/2021 FINDINGS: Prompt clearance radiotracer from blood pool and homogeneous uptake within liver. Counts are present within the common bile duct by 15 minutes. Counts reached the proximal small bowel by 20 minutes. No filling of the gallbladder at 60 minutes. IV morphine was administered to augment filling of the gallbladder. No filling of the gallbladder present on 30 minutes of post morphine imaging. IMPRESSION: Non  filling the gallbladder is consistent with ACUTE CHOLECYSTITIS. Patent common bile duct. These results will be called to the ordering clinician or representative by the Radiologist Assistant, and communication documented in the PACS or [employer]. Electronically Signed   By: Suzy Bouchard M.D.   On: 03/28/2021 16:02   IR Perc Cholecystostomy  Result Date: 03/28/2021 INDICATION: 81 year old male referred for percutaneous cholecystostomy EXAM: CHOLECYSTOSTOMY MEDICATIONS: 2 g Mefoxin; The antibiotic was administered within an appropriate time frame prior to the initiation of the procedure. ANESTHESIA/SEDATION: Moderate (conscious) sedation was employed during this procedure. A total of Versed 1.0 mg and Fentanyl 25 mcg was administered intravenously. Moderate Sedation Time: 13 minutes. The patient's level of consciousness and vital signs were monitored continuously by radiology nursing throughout the procedure under my direct supervision. FLUOROSCOPY TIME:  Fluoroscopy Time: 0 minutes 42 seconds (3 mGy). COMPLICATIONS: None PROCEDURE: Informed written consent was obtained from the patient and the patient's family after a thorough discussion of the procedural risks, benefits and alternatives. All questions were addressed. Maximal Sterile Barrier Technique was utilized including caps, mask, sterile gowns, sterile gloves, sterile drape, hand hygiene and skin antiseptic. A timeout was performed prior to the initiation of the procedure. Ultrasound survey of the right upper quadrant was performed for planning purposes. Once the patient is prepped and draped in the usual sterile fashion, the skin and subcutaneous tissues overlying the gallbladder were generously infiltrated 1% lidocaine for local anesthesia. A coaxial needle was advanced under ultrasound guidance through the skin subcutaneous tissues and a small segment of liver into the gallbladder lumen. With removal of the stylet, spontaneous dark bile  drainage occurred. Using modified Seldinger technique, a 10 French drain was placed into the gallbladder fossa, with aspiration of the sample for the lab. Contrast injection confirmed position of the tube within the gallbladder lumen. Drainage catheter was attached to gravity drain with a suture retention placed. Patient tolerated the procedure well and remained hemodynamically stable throughout. No complications were encountered and no significant blood loss encountered. IMPRESSION: Status post image guided percutaneous cholecystostomy. Signed, Dulcy Fanny. Dellia Nims, RPVI [facility] Electronically Signed   By: Corrie Mckusick D.O.   On: 03/28/2021 17:26   US Abdomen Limited RUQ (LIVER/GB)  Result Date: 03/27/2021 CLINICAL DATA:  Abdominal pain. EXAM: ULTRASOUND ABDOMEN LIMITED RIGHT UPPER QUADRANT COMPARISON:  CT abdomen and pelvis 03/23/2021 FINDINGS: Gallbladder: Distended gallbladder containing debris/sludge and tiny stones. Pericholecystic fluid. No generalized wall thickening. A positive sonographic Percell Miller sign was noted by the sonographer. Common bile duct: Diameter: 5 mm Liver: Suboptimally evaluated due to bowel gas. No gross mass identified. Portal vein is patent on color Doppler imaging with normal direction of blood flow towards the liver. Other: Right pleural effusion. IMPRESSION: Distended gallbladder containing debris/sludge and tiny stones with pericholecystic fluid and positive sonographic Murphy sign concerning for acute cholecystitis. Electronically Signed   By: Logan Bores  M.D.   On: 03/27/2021 15:21    Anti-infectives: Anti-infectives (From admission, onward)    Start     Dose/Rate Route Frequency Ordered Stop   03/28/21 1645  cefOXitin (MEFOXIN) 2 g in sodium chloride 0.9 % 100 mL IVPB       Note to Pharmacy: Please send to tube station 17 radiology- for procedure   2 g 200 mL/hr over 30 Minutes Intravenous  Once 03/28/21  1550 03/28/21 1720   03/27/21 1645  vancomycin (VANCOREADY) IVPB 1500 mg/300 mL        1,500 mg 150 mL/hr over 120 Minutes Intravenous  Once 03/27/21 1632 03/27/21 1942   03/27/21 1630  piperacillin-tazobactam (ZOSYN) IVPB 2.25 g        2.25 g 100 mL/hr over 30 Minutes Intravenous Every 8 hours 03/27/21 1617          Assessment/Plan  Acute Cholecystitis - H/o of perc chole 1/12, removed 1/19 after becoming dislodged unintentionally - HIDA +. S/p Perc chole drain 1/24. Cx's pending - Cont IV abx  - No role for acute surgical intervention. He is s/p IR perc chole. Recommend 10d total of abx from our standpoint. Cx's pending. We will arrange follow up in the office. We will be available as needed moving forward. Please call back with any questions or concerns.    FEN: CLD. Okay to advance diet from our standpoint. Was followed by slp during last admission  ID: vanc 1/23. Zosyn 1/23 >  VTE: Okay for chemical prophylaxis or anticoagulation from our standpoint. Anticoagulation d/c'd during prior admission 2/2 GI bleed   Per primary ESRD on dialysis Paroxysmal afib T2DM Parkinson's disease PVD s/p L BKA Hx UR w/ foley  Pleural effusions  Moderate Medical Decision Making   LOS: 1 day    Jillyn Ledger , [facility] 03/29/2021, 9:39 AM Please see Amion for pager number during day hours 7:00am-4:30pm

## 2021-03-29 NOTE — Progress Notes (Signed)
If patient is to be discharged, below are discharge instructions: - Flush each drain once daily with 5 ml NS flush (patient will need an order for flushes upon discharge). RN aware to teach patient how to manage drains at home. - Record output from drain once daily. - Follow-up with [facility] 6-8 weeks after discharge for drain evaluation/exchange. IR schedulers to call patient to set up this appointment.   Soyla Dryer, [place] [phone] 03/29/2021, 1:25 PM

## 2021-03-29 NOTE — Plan of Care (Signed)
°  Problem: Education: Goal: Knowledge of General Education information will improve Description: Including pain rating scale, medication(s)/side effects and non-pharmacologic comfort measures Outcome: Not Progressing   Problem: Health Behavior/Discharge Planning: Goal: Ability to manage health-related needs will improve Outcome: Not Progressing   Problem: Clinical Measurements: Goal: Ability to maintain clinical measurements within normal limits will improve Outcome: Not Progressing Goal: Will remain free from infection Outcome: Not Progressing Goal: Diagnostic test results will improve Outcome: Not Progressing Goal: Respiratory complications will improve Outcome: Not Progressing Goal: Cardiovascular complication will be avoided Outcome: Not Progressing   Problem: Activity: Goal: Risk for activity intolerance will decrease Outcome: Not Progressing   Problem: Nutrition: Goal: Adequate nutrition will be maintained Outcome: Not Progressing   Problem: Coping: Goal: Level of anxiety will decrease Outcome: Not Progressing   Problem: Pain Managment: Goal: General experience of comfort will improve Outcome: Not Progressing   Problem: Safety: Goal: Ability to remain free from injury will improve Outcome: Not Progressing   

## 2021-03-29 NOTE — Progress Notes (Signed)
°   03/29/21 1109  Vitals  Temp 97.9 F (36.6 C)  Temp Source Oral  BP 120/65  BP Location Right Arm  BP Method Automatic  Patient Position (if appropriate) Lying  Pulse Rate 82  Pulse Rate Source Monitor  Resp 19  Oxygen Therapy  SpO2 98 %  O2 Device Room Air  Dialysis Weight  Weight 68.2 kg  Type of Weight Post-Dialysis  Post-Hemodialysis Assessment  Rinseback Volume (mL) 250 mL  KECN 122 V  Dialyzer Clearance Lightly streaked  Duration of HD Treatment -hour(s) 3 hour(s)  Hemodialysis Intake (mL) 500 mL  UF Total -Machine (mL) 3126 mL  Net UF (mL) 2626 mL  Tolerated HD Treatment  (see progress notes)  Post-Hemodialysis Comments tx complete-pt stable  AVG/AVF Arterial Site Held (minutes) 10 minutes  AVG/AVF Venous Site Held (minutes) 10 minutes  Fistula / Graft Left Upper arm  No placement date or time found.   Orientation: Left  Access Location: Upper arm  Site Condition No complications  Fistula / Graft Assessment Present;Thrill;Bruit  Status Deaccessed   Hd tx complete, pt stable. Pt noted with SBP<85 at times, UF paused and then restarted. Pt asymptomatic, Dr.Schertz on unit and made aware. Given dilaudid x1, part effects noted. Pt with frequent reports of "pinching and sharp" pain at insertion site of drainage tube right lateral abdomen. Pt assessed by surgeon, tube manipulated, pt verbalized partial relief.

## 2021-03-29 NOTE — Plan of Care (Signed)
°  Problem: Education: Goal: Knowledge of General Education information will improve Description: Including pain rating scale, medication(s)/side effects and non-pharmacologic comfort measures Outcome: Not Progressing   Problem: Health Behavior/Discharge Planning: Goal: Ability to manage health-related needs will improve Outcome: Not Progressing   Problem: Clinical Measurements: Goal: Ability to maintain clinical measurements within normal limits will improve Outcome: Not Progressing Goal: Will remain free from infection Outcome: Not Progressing Goal: Diagnostic test results will improve Outcome: Not Progressing Goal: Respiratory complications will improve Outcome: Not Progressing Goal: Cardiovascular complication will be avoided Outcome: Not Progressing   Problem: Clinical Measurements: Goal: Will remain free from infection Outcome: Not Progressing   Problem: Clinical Measurements: Goal: Diagnostic test results will improve Outcome: Not Progressing   Problem: Activity: Goal: Risk for activity intolerance will decrease Outcome: Not Progressing   Problem: Nutrition: Goal: Adequate nutrition will be maintained Outcome: Not Progressing   Problem: Elimination: Goal: Will not experience complications related to bowel motility Outcome: Not Progressing Goal: Will not experience complications related to urinary retention Outcome: Not Progressing   Problem: Pain Managment: Goal: General experience of comfort will improve Outcome: Not Progressing   Problem: Safety: Goal: Ability to remain free from injury will improve Outcome: Not Progressing   Problem: Health Behavior/Discharge Planning: Goal: Ability to manage health-related needs will improve Outcome: Not Progressing   Problem: Nutrition: Goal: Adequate nutrition will be maintained Outcome: Not Progressing   Problem: Coping: Goal: Level of anxiety will decrease Outcome: Not Progressing   Problem:  Elimination: Goal: Will not experience complications related to bowel motility Outcome: Not Progressing Goal: Will not experience complications related to urinary retention Outcome: Not Progressing   Problem: Pain Managment: Goal: General experience of comfort will improve Outcome: Not Progressing   Problem: Safety: Goal: Ability to remain free from injury will improve Outcome: Not Progressing

## 2021-03-29 NOTE — Progress Notes (Signed)
[facility] Progress Note   Interim Summary: pt w/ ESRD on HD, blindness, DNR, sp TAVR, CAD, L BKA, DM2 was admitted 1/11 for acute cholecystitis rx'd w/ perc chole tube (placed 1/12 - 1/17 removed), also had new afib and urine retention. Pt was dc'd on 1/22 to complete po abx course. He now has worsening abd pain whole abdomen, worse in RUQ and n/v. WBC 15k in ED. U/S showed distended GB, +stones and sludge, +murphy's sign. IR and gen surg consulted, HIDA recommended. Asked to see for ESRD.    Subjective:   pt seen on HD, got 2.6 L off, BP's dropped once  Objective Vitals:   03/29/21 1102 03/29/21 1109 03/29/21 1133 03/29/21 1443  BP: 126/65 120/65 (!) 130/59 136/61  Pulse: 84 82 90 81  Resp:  19 17   Temp:  97.9 F (36.6 C) 98 F (36.7 C) 98.3 F (36.8 C)  TempSrc:  Oral Oral Oral  SpO2:  98% 99% 99%  Weight:  68.2 kg    Height:       Physical Exam General: Blind, no distress, on HD Heart: Normal S1 and S2; No murmurs, gallops, or rubs Lungs: Clear anteriorly and laterally Abdomen: Soft and non-tender, R flank drain in place Extremities: 1+ pitting edema bilat LEs Dialysis Access: L AVF (+) Bruit/Thrill     Dialysis Orders: [place] MWF  4h  400/600  66kg 2/2 bath  P2  AVF  Hep none  - sensipar 60 mg po tiw  - hectorol 3 ug tiw IV  - mircera 75 ug q 2 wks, just ordered  Assessment/Plan: Abdominal pain: recent admit for acute cholecystitis, S/P perc chole tube 1/12- 1/17 per IR, dc'd on 1/22. Readmitted now w/ abd pain, and SP new perc chole tube by IR yesterday 1/24. Per pmd / IR. Getting IV abx.  Atrial fib - recent onset, per pmd  ESRD - on HD MWF. HD today on schedule. Next HD Friday.  BP/volume  - BP's up, was down to 62 kg during recent admit. Down to 68kg post HD today which is at least 2kg over  now.  Anemia ckd  - Hb 8.2, started ESA last admit darbe 40 weekly, will continue.  Metabolic bone disease -  changed auryxia to Renvela binders 2 ac last  admit, phos 4.9. Continue sensipar and vdra. Ca in range.  Nutrition - Albumin <2. Protein supps ordered. Renal diet.  Parkinson's disease-per primary DMT2-per primary H/O TAVR DNR   Kelly Splinter, MD 03/29/2021, 3:07 PM       Filed Weights   03/28/21 1748 03/29/21 0739 03/29/21 1109  Weight: 74.4 kg 70.3 kg 68.2 kg    Intake/Output Summary (Last 24 hours) at 03/29/2021 1507 Last data filed at 03/29/2021 1109 Gross per 24 hour  Intake --  Output 3276 ml  Net -3276 ml     Additional Objective Labs: Basic Metabolic Panel: Recent Labs  Lab 03/27/21 1143 03/28/21 0319 03/29/21 0346  NA 131* 128* 128*  K 4.6 3.9 4.2  CL 93* 94* 95*  CO2 22 25 21*  GLUCOSE 123* 92 84  BUN 35* 37* 43*  CREATININE 5.60* 6.13* 7.04*  CALCIUM 8.8* 8.2* 8.3*  PHOS  --  4.9*  --     Liver Function Tests: Recent Labs  Lab 03/26/21 0346 03/27/21 1143 03/28/21 0319  AST 18 35 122*  ALT 6 6 8   ALKPHOS 135* 191* 409*  BILITOT 0.7 1.0 0.9  PROT 5.3* 6.8 5.3*  ALBUMIN 1.7* 2.4* 1.7*    Recent Labs  Lab 03/27/21 1143  LIPASE 30    CBC: Recent Labs  Lab 03/25/21 0420 03/26/21 0346 03/27/21 1143 03/28/21 0319 03/29/21 0346  WBC 11.5* 12.6* 15.7* 13.7* 14.4*  NEUTROABS  --   --   --   --  12.0*  HGB 9.4* 8.7* 11.4* 8.2* 8.4*  HCT 27.3* 25.1* 33.7* 24.7* 25.1*  MCV 101.5* 101.6* 103.1* 101.6* 100.8*  PLT 396 403* 490* 438* 427*    Blood Culture    Component Value Date/Time   SDES ABSCESS 03/28/2021 1710   SPECREQUEST GALLBLADDER 03/28/2021 1710   CULT  03/28/2021 1710    NO GROWTH < 24 HOURS Performed at [facility], [place] [address], [place], [place] [address]    REPTSTATUS PENDING 03/28/2021 1710    Cardiac Enzymes: No results for input(s): CKTOTAL, CKMB, CKMBINDEX, TROPONINI in the last 168 hours. CBG: Recent Labs  Lab 03/28/21 1901 03/28/21 1932 03/28/21 2337 03/29/21 0352 03/29/21 1135  GLUCAP 72 64* 87 86 80    Iron Studies:  No results for  input(s): IRON, TIBC, TRANSFERRIN, FERRITIN in the last 72 hours.  Lab Results  Component Value Date   INR 1.2 03/28/2021   INR 1.0 01/14/2020   INR 1.0 11/26/2019    Medications:  chlorproMAZINE (THORAZINE) IV     piperacillin-tazobactam (ZOSYN)  IV 2.25 g (03/29/21 1258)    brimonidine  1 drop Right Eye TID   carbidopa-levodopa  1 tablet Oral TID   Chlorhexidine Gluconate Cloth  6 each Topical Daily   Chlorhexidine Gluconate Cloth  6 each Topical Q0600   cinacalcet  60 mg Oral Q M,W,F-HD   darbepoetin (ARANESP) injection - DIALYSIS  40 mcg Intravenous Q Wed-HD   dorzolamide-timolol  1 drop Right Eye BID   doxercalciferol  3 mcg Intravenous Q M,W,F-HD   gabapentin  100 mg Oral Daily   heparin injection (subcutaneous)  5,000 Units Subcutaneous Q8H   insulin aspart  0-6 Units Subcutaneous Q4H   metoprolol tartrate  12.5 mg Oral BID   Netarsudil-Latanoprost  1 drop Right Eye QHS   pilocarpine  1 drop Right Eye QID   sodium chloride flush  5 mL Intracatheter Q8H

## 2021-03-29 NOTE — Progress Notes (Signed)
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  She was recently admitted from 1/11-03/2020 for acute cholecystitis managed with percutaneous cholecystectomy tube which was unintentionally dislodged and subsequently removed on 1/19 and was discharged with oral antibiotics.  After being discharged to nursing facility, he developed severe abdominal pain with associated nausea and vomiting.  On presentation she was afebrile, lab work showed worsening leukocytosis.  Right upper quadrant ultrasound showed distended gallbladder with cholelithiasis, sludge, pericholecystic fluid.  Murphy sign was positive.  IR, general surgery consulted.  Underwent cholecystostomy tube placement by IR on 1/24.  Cultures pending  Assessment & Plan:   Principal Problem:   Acute cholecystitis Active Problems:   Type 2 diabetes mellitus with renal manifestations (HCC)   Essential hypertension   HLD (hyperlipidemia)   ESRD (end stage renal disease) (HCC)   Macrocytic anemia   RBBB   Blindness   S/P TAVR (transcatheter aortic valve replacement)   Anemia in chronic kidney disease   Secondary hyperparathyroidism of renal origin ([place])   PAD (peripheral artery disease) ([place])   DNR (do not resuscitate)   Pressure injury of skin   Acute cholecystitis:He was recently admitted from 1/11-03/2020 for acute cholecystitis managed with percutaneous cholecystectomy tube which was unintentionally dislodged and subsequently removed on 1/19 and was discharged with oral antibiotics.  After  being discharged to nursing facility, he developed severe abdominal pain with associated nausea and vomiting.  On presentation she was afebrile, lab work showed worsening leukocytosis.  Right upper quadrant ultrasound showed distended gallbladder with cholelithiasis, sludge, pericholecystic fluid.  Murphy sign was positive.  IR, general surgery consulted.  Underwent cholecystostomy tube placement by IR on 1/24.   Continue pain management supportive care.  Blood cultures will be followed.  Aerobic/anaerobic culture have not shown any growth .continue current antibiotics.  Leukocytosis will be monitored.  Plan for total of 10 days duration of antibiotics.  ESRD: On dialysis on MWF.  Nephrology following for dialysis  Bilateral pleural effusion: Moderate to large RIGHT and small LEFT pleural effusions with associated RIGHT basilar atelectasis as per CXR.  This finding was also present on last admission.  No respiratory symptoms.  Currently on room air.  We will check if he is a candidate for thoracentesis.  Volume management with dialysis  Paroxysmal A. fib: Currently in normal sinus rhythm.  Not on anticoagulation due to concern for GI bleed.  On low-dose metoprolol for rate control.  Monitor on telemetry  Non-insulin-dependent diabetes type 2: Recent A1c of 7%.  Monitor blood sugars.  Currently on sliding scale insulin.  On gabapentin for diabetic neuropathy.  History of glaucoma: Continue eyedrops  Parkinson's disease: Continue Sinemet.  Delirium precautions  History of peripheral vascular disease : status post left BKA.  Takes Plavix which is on hold for upcoming possible procedures for cholecystitis  History of urinary retention: On last admission Foley was placed and he was discharged with Foley.  Continue Foley catheter for now.  We recommend to follow-up with urology as an outpatient  Macrocytic anemia: Associated with ESRD.  No evidence of bleeding.  Hemoglobin stable  Hyponatremia:  Likely from volume overload.  Continue monitoring.  Follow management as per dialysis             DVT prophylaxis:Heparin [place] Code Status: DNR Family Communication:: Discussed with daughter on phone on 1/25 Patient status: Observation  Dispo: The patient is from: Skilled nursing facility              Anticipated d/c is to: Skilled nursing facility              Anticipated d/c date is: 2-3 days  Consultants: IR, general surgery, nephrology  Procedures: None  Antimicrobials:  Anti-infectives (From admission, onward)    Start     Dose/Rate Route Frequency Ordered Stop   03/28/21 1645  cefOXitin (MEFOXIN) 2 g in sodium chloride 0.9 % 100 mL IVPB       Note to Pharmacy: Please send to tube station 17 radiology- for procedure   2 g 200 mL/hr over 30 Minutes Intravenous  Once 03/28/21 1550 03/28/21 1720   03/27/21 1645  vancomycin (VANCOREADY) IVPB 1500 mg/300 mL        1,500 mg 150 mL/hr over 120 Minutes Intravenous  Once 03/27/21 1632 03/27/21 1942   03/27/21 1630  piperacillin-tazobactam (ZOSYN) IVPB 2.25 g        2.25 g 100 mL/hr over 30 Minutes Intravenous Every 8 hours 03/27/21 1617         Subjective: Patient seen and examined at the bedside this morning.  Hemodynamically stable.  On dialysis.  He was complaining of pain on the drain site.  Objective: Vitals:   03/29/21 1030 03/29/21 1102 03/29/21 1109 03/29/21 1133  BP: (P) 117/63 126/65 120/65 (!) 130/59  Pulse: (P) 88 84 82 90  Resp:   19   Temp:   97.9 F (36.6 C) 98 F (36.7 C)  TempSrc:   Oral Oral  SpO2:   98% 99%  Weight:   68.2 kg   Height:        Intake/Output Summary (Last 24 hours) at 03/29/2021 1135 Last data filed at 03/29/2021 1109 Gross per 24 hour  Intake --  Output 3276 ml  Net -3276 ml   Filed Weights   03/28/21 1748 03/29/21 0739 03/29/21 1109  Weight: 74.4 kg 70.3 kg 68.2 kg    Examination:  General exam: Deconditioned, chronically looking HEENT: Legally blind  respiratory  system:  no wheezes or crackles  Cardiovascular system: S1 & S2 heard, RRR.  Gastrointestinal system: Abdomen is nondistended, soft .  Tenderness on the right upper quadrant, right upper quadrant drain with sanguinous output Central nervous system: Alert and oriented Extremities: No edema, no clubbing ,no cyanosis, AV fistula in the left upper extremity Skin: No rashes, no ulcers,no icterus, left BKA  Data Reviewed: I have personally reviewed following labs and imaging studies  CBC: Recent Labs  Lab 03/25/21 0420 03/26/21 0346 03/27/21 1143 03/28/21 0319 03/29/21 0346  WBC 11.5* 12.6* 15.7* 13.7* 14.4*  NEUTROABS  --   --   --   --  12.0*  HGB 9.4* 8.7* 11.4* 8.2* 8.4*  HCT 27.3* 25.1* 33.7* 24.7* 25.1*  MCV 101.5* 101.6* 103.1* 101.6* 100.8*  PLT 396 403* 490* 438* 295*   Basic Metabolic Panel: Recent Labs  Lab 03/26/21 0346 03/27/21 1143 03/28/21 0319 03/29/21 0346  NA 132* 131* 128* 128*  K 4.0 4.6 3.9 4.2  CL 96* 93* 94* 95*  CO2 26 22 25  21*  GLUCOSE 104* 123* 92 84  BUN 25*  35* 37* 43*  CREATININE 4.18* 5.60* 6.13* 7.04*  CALCIUM 8.2* 8.8* 8.2* 8.3*  PHOS  --   --  4.9*  --    GFR: Estimated Creatinine Clearance: 7 mL/min (A) (by C-G formula based on SCr of 7.04 mg/dL (H)). Liver Function Tests: Recent Labs  Lab 03/26/21 0346 03/27/21 1143 03/28/21 0319  AST 18 35 122*  ALT 6 6 8   ALKPHOS 135* 191* 409*  BILITOT 0.7 1.0 0.9  PROT 5.3* 6.8 5.3*  ALBUMIN 1.7* 2.4* 1.7*   Recent Labs  Lab 03/27/21 1143  LIPASE 30   No results for input(s): AMMONIA in the last 168 hours. Coagulation Profile: Recent Labs  Lab 03/28/21 0319  INR 1.2   Cardiac Enzymes: No results for input(s): CKTOTAL, CKMB, CKMBINDEX, TROPONINI in the last 168 hours. BNP (last 3 results) No results for input(s): PROBNP in the last 8760 hours. HbA1C: No results for input(s): HGBA1C in the last 72 hours. CBG: Recent Labs  Lab 03/28/21 1820 03/28/21 1901 03/28/21 1932  03/28/21 2337 03/29/21 0352  GLUCAP 67* 72 64* 87 86   Lipid Profile: No results for input(s): CHOL, HDL, LDLCALC, TRIG, CHOLHDL, LDLDIRECT in the last 72 hours. Thyroid Function Tests: No results for input(s): TSH, T4TOTAL, FREET4, T3FREE, THYROIDAB in the last 72 hours. Anemia Panel: No results for input(s): VITAMINB12, FOLATE, FERRITIN, TIBC, IRON, RETICCTPCT in the last 72 hours. Sepsis Labs: No results for input(s): PROCALCITON, LATICACIDVEN in the last 168 hours.  Recent Results (from the past 240 hour(s))  Resp Panel by RT-PCR (Flu A&B, Covid) Nasopharyngeal Swab     Status: None   Collection Time: 03/22/21 12:42 PM   Specimen: Nasopharyngeal Swab; Nasopharyngeal(NP) swabs in vial transport medium  Result Value Ref Range Status   SARS Coronavirus 2 by RT PCR NEGATIVE NEGATIVE Final    Comment: (NOTE) SARS-CoV-2 target nucleic acids are NOT DETECTED.  The SARS-CoV-2 RNA is generally detectable in upper respiratory specimens during the acute phase of infection. The lowest concentration of SARS-CoV-2 viral copies this assay can detect is 138 copies/mL. A negative result does not preclude SARS-Cov-2 infection and should not be used as the sole basis for treatment or other patient management decisions. A negative result may occur with  improper specimen collection/handling, submission of specimen other than nasopharyngeal swab, presence of viral mutation(s) within the areas targeted by this assay, and inadequate number of viral copies(<138 copies/mL). A negative result must be combined with clinical observations, patient history, and epidemiological information. The expected result is Negative.  Fact Sheet for Patients:  EntrepreneurPulse.com.au  Fact Sheet for Healthcare Providers:  IncredibleEmployment.be  This test is no t yet approved or cleared by the [place] FDA and  has been authorized for detection and/or diagnosis of  SARS-CoV-2 by FDA under an Emergency Use Authorization (EUA). This EUA will remain  in effect (meaning this test can be used) for the duration of the COVID-19 declaration under Section 564(b)(1) of the Act, 21 U.S.C.section 360bbb-3(b)(1), unless the authorization is terminated  or revoked sooner.       Influenza A by PCR NEGATIVE NEGATIVE Final   Influenza B by PCR NEGATIVE NEGATIVE Final    Comment: (NOTE) The Xpert Xpress SARS-CoV-2/FLU/RSV plus assay is intended as an aid in the diagnosis of influenza from Nasopharyngeal swab specimens and should not be used as a sole basis for treatment. Nasal washings and aspirates are unacceptable for Xpert Xpress SARS-CoV-2/FLU/RSV testing.  Fact Sheet for Patients: EntrepreneurPulse.com.au  Fact Sheet  for Healthcare Providers: IncredibleEmployment.be  This test is not yet approved or cleared by the [place] and has been authorized for detection and/or diagnosis of SARS-CoV-2 by FDA under an Emergency Use Authorization (EUA). This EUA will remain in effect (meaning this test can be used) for the duration of the COVID-19 declaration under Section 564(b)(1) of the Act, 21 U.S.C. section 360bbb-3(b)(1), unless the authorization is terminated or revoked.  Performed at [facility], [place] [address] [place], [place] [address]   Resp Panel by RT-PCR (Flu A&B, Covid) Nasopharyngeal Swab     Status: None   Collection Time: 03/26/21 11:56 AM   Specimen: Nasopharyngeal Swab; Nasopharyngeal(NP) swabs in vial transport medium  Result Value Ref Range Status   SARS Coronavirus 2 by RT PCR NEGATIVE NEGATIVE Final    Comment: (NOTE) SARS-CoV-2 target nucleic acids are NOT DETECTED.  The SARS-CoV-2 RNA is generally detectable in upper respiratory specimens during the acute phase of infection. The lowest concentration of SARS-CoV-2 viral copies this assay can detect is 138 copies/mL. A negative  result does not preclude SARS-Cov-2 infection and should not be used as the sole basis for treatment or other patient management decisions. A negative result may occur with  improper specimen collection/handling, submission of specimen other than nasopharyngeal swab, presence of viral mutation(s) within the areas targeted by this assay, and inadequate number of viral copies(<138 copies/mL). A negative result must be combined with clinical observations, patient history, and epidemiological information. The expected result is Negative.  Fact Sheet for Patients:  EntrepreneurPulse.com.au  Fact Sheet for Healthcare Providers:  IncredibleEmployment.be  This test is no t yet approved or cleared by the [place] FDA and  has been authorized for detection and/or diagnosis of SARS-CoV-2 by FDA under an Emergency Use Authorization (EUA). This EUA will remain  in effect (meaning this test can be used) for the duration of the COVID-19 declaration under Section 564(b)(1) of the Act, 21 U.S.C.section 360bbb-3(b)(1), unless the authorization is terminated  or revoked sooner.       Influenza A by PCR NEGATIVE NEGATIVE Final   Influenza B by PCR NEGATIVE NEGATIVE Final    Comment: (NOTE) The Xpert Xpress SARS-CoV-2/FLU/RSV plus assay is intended as an aid in the diagnosis of influenza from Nasopharyngeal swab specimens and should not be used as a sole basis for treatment. Nasal washings and aspirates are unacceptable for Xpert Xpress SARS-CoV-2/FLU/RSV testing.  Fact Sheet for Patients: EntrepreneurPulse.com.au  Fact Sheet for Healthcare Providers: IncredibleEmployment.be  This test is not yet approved or cleared by the [place] FDA and has been authorized for detection and/or diagnosis of SARS-CoV-2 by FDA under an Emergency Use Authorization (EUA). This EUA will remain in effect (meaning this test can be used)  for the duration of the COVID-19 declaration under Section 564(b)(1) of the Act, 21 U.S.C. section 360bbb-3(b)(1), unless the authorization is terminated or revoked.  Performed at [facility], [place] [address], [place], [place] [address]   Resp Panel by RT-PCR (Flu A&B, Covid) Nasopharyngeal Swab     Status: None   Collection Time: 03/27/21  2:10 PM   Specimen: Nasopharyngeal Swab; Nasopharyngeal(NP) swabs in vial transport medium  Result Value Ref Range Status   SARS Coronavirus 2 by RT PCR NEGATIVE NEGATIVE Final    Comment: (NOTE) SARS-CoV-2 target nucleic acids are NOT DETECTED.  The SARS-CoV-2 RNA is generally detectable in upper respiratory specimens during the acute phase of infection. The lowest concentration of SARS-CoV-2 viral  copies this assay can detect is 138 copies/mL. A negative result does not preclude SARS-Cov-2 infection and should not be used as the sole basis for treatment or other patient management decisions. A negative result may occur with  improper specimen collection/handling, submission of specimen other than nasopharyngeal swab, presence of viral mutation(s) within the areas targeted by this assay, and inadequate number of viral copies(<138 copies/mL). A negative result must be combined with clinical observations, patient history, and epidemiological information. The expected result is Negative.  Fact Sheet for Patients:  EntrepreneurPulse.com.au  Fact Sheet for Healthcare Providers:  IncredibleEmployment.be  This test is no t yet approved or cleared by the [place] FDA and  has been authorized for detection and/or diagnosis of SARS-CoV-2 by FDA under an Emergency Use Authorization (EUA). This EUA will remain  in effect (meaning this test can be used) for the duration of the COVID-19 declaration under Section 564(b)(1) of the Act, 21 U.S.C.section 360bbb-3(b)(1), unless the authorization is terminated   or revoked sooner.       Influenza A by PCR NEGATIVE NEGATIVE Final   Influenza B by PCR NEGATIVE NEGATIVE Final    Comment: (NOTE) The Xpert Xpress SARS-CoV-2/FLU/RSV plus assay is intended as an aid in the diagnosis of influenza from Nasopharyngeal swab specimens and should not be used as a sole basis for treatment. Nasal washings and aspirates are unacceptable for Xpert Xpress SARS-CoV-2/FLU/RSV testing.  Fact Sheet for Patients: EntrepreneurPulse.com.au  Fact Sheet for Healthcare Providers: IncredibleEmployment.be  This test is not yet approved or cleared by the [place] FDA and has been authorized for detection and/or diagnosis of SARS-CoV-2 by FDA under an Emergency Use Authorization (EUA). This EUA will remain in effect (meaning this test can be used) for the duration of the COVID-19 declaration under Section 564(b)(1) of the Act, 21 U.S.C. section 360bbb-3(b)(1), unless the authorization is terminated or revoked.  Performed at [facility], [place] [address], [place], [place] [address]   Culture, blood (routine x 2)     Status: None (Preliminary result)   Collection Time: 03/27/21  4:03 PM   Specimen: BLOOD  Result Value Ref Range Status   Specimen Description BLOOD BLOOD RIGHT ARM  Final   Special Requests   Final    BOTTLES DRAWN AEROBIC AND ANAEROBIC Blood Culture results may not be optimal due to an excessive volume of blood received in culture bottles   Culture   Final    NO GROWTH 2 DAYS Performed at [facility], [place] [address], [place], [place] [address]    Report Status PENDING  Incomplete  Culture, blood (routine x 2)     Status: None (Preliminary result)   Collection Time: 03/27/21  6:49 PM   Specimen: BLOOD RIGHT HAND  Result Value Ref Range Status   Specimen Description BLOOD RIGHT HAND  Final   Special Requests   Final    BOTTLES DRAWN AEROBIC AND ANAEROBIC Blood Culture adequate volume   Culture    Final    NO GROWTH 2 DAYS Performed at [facility], [place] [address] [place],  [address]    Report Status PENDING  Incomplete  Aerobic/Anaerobic Culture w Gram Stain (surgical/deep wound)     Status: None (Preliminary result)   Collection Time: 03/28/21  5:10 PM   Specimen: Abscess  Result Value Ref Range Status   Specimen Description ABSCESS  Final   Special Requests GALLBLADDER  Final   Gram Stain   Final  FEW WBC PRESENT,BOTH PMN AND MONONUCLEAR NO ORGANISMS SEEN    Culture   Final    NO GROWTH < 24 HOURS Performed at [facility] [address], [place], [place] [address]    Report Status PENDING  Incomplete         Radiology Studies: DG Chest 2 View  Result Date: 03/27/2021 CLINICAL DATA:  Assess, abdominal pain, nausea and vomiting since yesterday, recent hospitalization for cholecystitis and complicated UTI EXAM: CHEST - 2 VIEW COMPARISON:  03/17/2021 FINDINGS: Normal heart size post TAVR. Mediastinal contours and pulmonary vascularity normal. Moderate to large RIGHT pleural effusion with small LEFT pleural effusion. Mild RIGHT basilar atelectasis and central peribronchial thickening. Upper lungs clear. No pneumothorax or acute osseous findings. IMPRESSION: Moderate to large RIGHT and small LEFT pleural effusions with associated RIGHT basilar atelectasis. Electronically Signed   By: Lavonia Dana M.D.   On: 03/27/2021 15:20   NM Hepatobiliary Liver Func  Result Date: 03/28/2021 CLINICAL DATA:  Ultrasound CT findings concern for acute cholecystitis. EXAM: NUCLEAR MEDICINE HEPATOBILIARY IMAGING TECHNIQUE: Sequential images of the abdomen were obtained out to 60 minutes following intravenous administration of radiopharmaceutical. RADIOPHARMACEUTICALS:  5.3 mCi Tc-44m  Choletec IV COMPARISON:  Ultrasound 03/27/2021, CT 03/23/2021 FINDINGS: Prompt clearance radiotracer from blood pool and homogeneous uptake within liver. Counts are present within the common  bile duct by 15 minutes. Counts reached the proximal small bowel by 20 minutes. No filling of the gallbladder at 60 minutes. IV morphine was administered to augment filling of the gallbladder. No filling of the gallbladder present on 30 minutes of post morphine imaging. IMPRESSION: Non filling the gallbladder is consistent with ACUTE CHOLECYSTITIS. Patent common bile duct. These results will be called to the ordering clinician or representative by the Radiologist Assistant, and communication documented in the PACS or [employer]. Electronically Signed   By: Suzy Bouchard M.D.   On: 03/28/2021 16:02   IR Perc Cholecystostomy  Result Date: 03/28/2021 INDICATION: 81 year old male referred for percutaneous cholecystostomy EXAM: CHOLECYSTOSTOMY MEDICATIONS: 2 g Mefoxin; The antibiotic was administered within an appropriate time frame prior to the initiation of the procedure. ANESTHESIA/SEDATION: Moderate (conscious) sedation was employed during this procedure. A total of Versed 1.0 mg and Fentanyl 25 mcg was administered intravenously. Moderate Sedation Time: 13 minutes. The patient's level of consciousness and vital signs were monitored continuously by radiology nursing throughout the procedure under my direct supervision. FLUOROSCOPY TIME:  Fluoroscopy Time: 0 minutes 42 seconds (3 mGy). COMPLICATIONS: None PROCEDURE: Informed written consent was obtained from the patient and the patient's family after a thorough discussion of the procedural risks, benefits and alternatives. All questions were addressed. Maximal Sterile Barrier Technique was utilized including caps, mask, sterile gowns, sterile gloves, sterile drape, hand hygiene and skin antiseptic. A timeout was performed prior to the initiation of the procedure. Ultrasound survey of the right upper quadrant was performed for planning purposes. Once the patient is prepped and draped in the usual sterile fashion, the skin and subcutaneous tissues overlying  the gallbladder were generously infiltrated 1% lidocaine for local anesthesia. A coaxial needle was advanced under ultrasound guidance through the skin subcutaneous tissues and a small segment of liver into the gallbladder lumen. With removal of the stylet, spontaneous dark bile drainage occurred. Using modified Seldinger technique, a 10 French drain was placed into the gallbladder fossa, with aspiration of the sample for the lab. Contrast injection confirmed position of the tube within the gallbladder lumen. Drainage catheter was attached to  gravity drain with a suture retention placed. Patient tolerated the procedure well and remained hemodynamically stable throughout. No complications were encountered and no significant blood loss encountered. IMPRESSION: Status post image guided percutaneous cholecystostomy. Signed, Dulcy Fanny. Dellia Nims, RPVI [facility] Electronically Signed   By: Corrie Mckusick D.O.   On: 03/28/2021 17:26   US Abdomen Limited RUQ (LIVER/GB)  Result Date: 03/27/2021 CLINICAL DATA:  Abdominal pain. EXAM: ULTRASOUND ABDOMEN LIMITED RIGHT UPPER QUADRANT COMPARISON:  CT abdomen and pelvis 03/23/2021 FINDINGS: Gallbladder: Distended gallbladder containing debris/sludge and tiny stones. Pericholecystic fluid. No generalized wall thickening. A positive sonographic Percell Miller sign was noted by the sonographer. Common bile duct: Diameter: 5 mm Liver: Suboptimally evaluated due to bowel gas. No gross mass identified. Portal vein is patent on color Doppler imaging with normal direction of blood flow towards the liver. Other: Right pleural effusion. IMPRESSION: Distended gallbladder containing debris/sludge and tiny stones with pericholecystic fluid and positive sonographic Murphy sign concerning for acute cholecystitis. Electronically Signed   By: Logan Bores M.D.   On: 03/27/2021 15:21        Scheduled Meds:  brimonidine  1 drop Right Eye  TID   carbidopa-levodopa  1 tablet Oral TID   Chlorhexidine Gluconate Cloth  6 each Topical Daily   Chlorhexidine Gluconate Cloth  6 each Topical Q0600   cinacalcet  60 mg Oral Q M,W,F-HD   darbepoetin (ARANESP) injection - DIALYSIS  40 mcg Intravenous Q Wed-HD   dorzolamide-timolol  1 drop Right Eye BID   doxercalciferol  3 mcg Intravenous Q M,W,F-HD   gabapentin  100 mg Oral Daily   insulin aspart  0-6 Units Subcutaneous Q4H   metoprolol tartrate  12.5 mg Oral BID    morphine injection  2.5 mg Intravenous Once   Netarsudil-Latanoprost  1 drop Right Eye QHS   pilocarpine  1 drop Right Eye QID   sodium chloride flush  5 mL Intracatheter Q8H   Continuous Infusions:  chlorproMAZINE (THORAZINE) IV     piperacillin-tazobactam (ZOSYN)  IV 2.25 g (03/28/21 2302)     LOS: 1 day       Shelly Coss, MD [facility] P1/25/2023, 11:35 AM

## 2021-03-30 LAB — CBC WITH DIFFERENTIAL/PLATELET
Abs Immature Granulocytes: 0.16 10*3/uL — ABNORMAL HIGH (ref 0.00–0.07)
Basophils Absolute: 0.1 10*3/uL (ref 0.0–0.1)
Basophils Relative: 0 %
Eosinophils Absolute: 0.1 10*3/uL (ref 0.0–0.5)
Eosinophils Relative: 1 %
HCT: 25.7 % — ABNORMAL LOW (ref 39.0–52.0)
Hemoglobin: 8.8 g/dL — ABNORMAL LOW (ref 13.0–17.0)
Immature Granulocytes: 1 %
Lymphocytes Relative: 5 %
Lymphs Abs: 0.9 10*3/uL (ref 0.7–4.0)
MCH: 34.8 pg — ABNORMAL HIGH (ref 26.0–34.0)
MCHC: 34.2 g/dL (ref 30.0–36.0)
MCV: 101.6 fL — ABNORMAL HIGH (ref 80.0–100.0)
Monocytes Absolute: 1.2 10*3/uL — ABNORMAL HIGH (ref 0.1–1.0)
Monocytes Relative: 7 %
Neutro Abs: 14.7 10*3/uL — ABNORMAL HIGH (ref 1.7–7.7)
Neutrophils Relative %: 86 %
Platelets: 453 10*3/uL — ABNORMAL HIGH (ref 150–400)
RBC: 2.53 MIL/uL — ABNORMAL LOW (ref 4.22–5.81)
RDW: 14.2 % (ref 11.5–15.5)
WBC: 17.1 10*3/uL — ABNORMAL HIGH (ref 4.0–10.5)
nRBC: 0 % (ref 0.0–0.2)

## 2021-03-30 LAB — GLUCOSE, CAPILLARY
Glucose-Capillary: 116 mg/dL — ABNORMAL HIGH (ref 70–99)
Glucose-Capillary: 116 mg/dL — ABNORMAL HIGH (ref 70–99)
Glucose-Capillary: 66 mg/dL — ABNORMAL LOW (ref 70–99)
Glucose-Capillary: 79 mg/dL (ref 70–99)
Glucose-Capillary: 82 mg/dL (ref 70–99)
Glucose-Capillary: 89 mg/dL (ref 70–99)
Glucose-Capillary: 94 mg/dL (ref 70–99)
Glucose-Capillary: 97 mg/dL (ref 70–99)

## 2021-03-30 LAB — BASIC METABOLIC PANEL
Anion gap: 14 (ref 5–15)
BUN: 29 mg/dL — ABNORMAL HIGH (ref 8–23)
CO2: 23 mmol/L (ref 22–32)
Calcium: 9.1 mg/dL (ref 8.9–10.3)
Chloride: 95 mmol/L — ABNORMAL LOW (ref 98–111)
Creatinine, Ser: 5.55 mg/dL — ABNORMAL HIGH (ref 0.61–1.24)
GFR, Estimated: 10 mL/min — ABNORMAL LOW (ref >60)
Glucose, Bld: 76 mg/dL (ref 70–99)
Potassium: 4.1 mmol/L (ref 3.5–5.1)
Sodium: 132 mmol/L — ABNORMAL LOW (ref 135–145)

## 2021-03-30 MED ORDER — SENNA 8.6 MG PO TABS
1.0000 | ORAL_TABLET | Freq: Two times a day (BID) | ORAL | Status: DC
  Administered 2021-03-30 – 2021-04-05 (×6): 8.6 mg via ORAL
  Filled 2021-03-30 (×9): qty 1

## 2021-03-30 MED ORDER — FLUCONAZOLE 100 MG PO TABS
100.0000 mg | ORAL_TABLET | Freq: Every day | ORAL | Status: DC
  Administered 2021-03-30 – 2021-03-31 (×2): 100 mg via ORAL
  Filled 2021-03-30 (×2): qty 1

## 2021-03-30 MED ORDER — POLYETHYLENE GLYCOL 3350 17 G PO PACK
17.0000 g | PACK | Freq: Every day | ORAL | Status: DC
  Administered 2021-03-30 – 2021-04-04 (×4): 17 g via ORAL
  Filled 2021-03-30 (×4): qty 1

## 2021-03-30 MED ORDER — GLUCOSE 40 % PO GEL
ORAL | Status: AC
  Filled 2021-03-30: qty 1

## 2021-03-30 NOTE — NC FL2 (Signed)
[place] LEVEL OF CARE SCREENING TOOL     IDENTIFICATION  Patient Name: Elijah White Birthdate: 1940/08/27 Sex: male Admission Date (Current Location): 03/27/2021  [place] and [place] Number:  [name] [phone] [place] and Address:  [address] [facility], [place] [address], [place], [place] [address]      Provider Number: [id number]  Attending Physician Name and Address:  Shelly Coss, MD  Relative Name and Phone Number:  [name] Daughter [phone]    Current Level of Care: Hospital Recommended Level of Care: [place] Prior Approval Number:    Date Approved/Denied:   PASRR Number: [id number]  Discharge Plan: SNF    Current Diagnoses: Patient Active Problem List   Diagnosis Date Noted   Pressure injury of skin 03/28/2021   PAD (peripheral artery disease) ([place])    DNR (do not resuscitate)    Complicated UTI (urinary tract infection)    Acute cholecystitis 03/15/2021   Hypokalemia 09/28/2020   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[place]) 08/23/2020   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, 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[place]) 08/12/2020   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   Acute on chronic diastolic heart failure ([place]) 01/19/2020   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   RBBB    Hypercalcemia 12/16/2019   Near syncope 12/13/2019   Moderate protein-calorie malnutrition ([place]) 12/04/2019   Severe aortic stenosis    ESRD (end stage renal disease) ([place]) 11/26/2019   Macrocytic anemia 11/26/2019   Adrenal mass ([place]) 11/26/2019   Colonic mass 11/26/2019   Allergy, unspecified, initial encounter 11/16/2019   Anaphylactic shock, unspecified, initial encounter 11/16/2019   Other long term (current) drug therapy 09/05/2019   Iron deficiency anemia, unspecified 07/13/2019   Encounter for immunization 04/17/2019   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   Dependence on renal dialysis ([place]) 04/16/2019   Hypertensive chronic  kidney disease with stage 1 through stage 4 chronic kidney disease, or unspecified chronic kidney disease 04/16/2019   Localized edema 04/16/2019   Malignant neoplasm of prostate ([place]) 04/16/2019   Nicotine dependence, unspecified, uncomplicated 71/24/5809   Other disorders of phosphorus metabolism 04/16/2019   Pain, unspecified 04/16/2019   Pruritus, unspecified 04/16/2019   Secondary hyperparathyroidism of renal origin ([place]) 04/16/2019   Shortness of breath 04/16/2019   Unspecified glaucoma 04/16/2019   Unspecified urinary incontinence 04/16/2019   ARF (acute renal failure) ([place]) 03/25/2018   Vision loss of right eye 11/11/2017   Bilateral pseudophakia 07/11/2017   GERD (gastroesophageal reflux disease) 09/10/2016   HLD (hyperlipidemia) 09/10/2016   Essential hypertension 08/13/2016   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   Type 2 diabetes mellitus with renal manifestations ([place]) 10/22/2006   BPH (benign prostatic hyperplasia) 10/22/2006   COLONIC POLYPS, HX OF 10/22/2006    Orientation RESPIRATION BLADDER Height & Weight     Self, Time, Situation, Place  Normal Incontinent Weight: 150 lb 5.7 oz (68.2 kg) Height:  5\' 5"  (165.1 cm)  BEHAVIORAL SYMPTOMS/MOOD NEUROLOGICAL BOWEL NUTRITION STATUS      Continent Diet (see discharge summary)  AMBULATORY STATUS COMMUNICATION OF NEEDS Skin   Total Care Verbally Normal                       Personal Care Assistance Level of Assistance  Bathing, Feeding, Dressing Bathing Assistance: Limited assistance Feeding assistance: Limited assistance Dressing Assistance: Limited assistance     Functional Limitations Info  Sight, Hearing, Speech Sight Info: Impaired Hearing Info: Adequate Speech Info: Adequate    SPECIAL CARE  FACTORS FREQUENCY                       Contractures Contractures Info: Not present    Additional Factors Info  Code Status, Allergies, Insulin Sliding Scale Code Status Info: DNR Allergies Info:  NKA   Insulin Sliding Scale Info: Novolog, 0-6 units q4 hours.  See discharge summary.       Current Medications (03/30/2021):  This is the current hospital active medication list Current Facility-Administered Medications  Medication Dose Route Frequency Provider Last Rate Last Admin   acetaminophen (TYLENOL) tablet 650 mg  650 mg Oral Q6H PRN Patrecia Pour, MD   650 mg at 03/30/21 0811   brimonidine (ALPHAGAN) 0.15 % ophthalmic solution 1 drop  1 drop Right Eye TID Patrecia Pour, MD   1 drop at 03/30/21 0804   carbidopa-levodopa (SINEMET IR) 25-100 MG per tablet immediate release 1 tablet  1 tablet Oral TID Wendee Copp, [place]   1 tablet at 03/30/21 9357   Chlorhexidine Gluconate Cloth 2 % PADS 6 each  6 each Topical Daily Shelly Coss, MD   6 each at 03/30/21 0816   Chlorhexidine Gluconate Cloth 2 % PADS 6 each  6 each Topical Q0600 Roney Jaffe, MD   6 each at 03/30/21 0177   chlorproMAZINE (THORAZINE) 12.5 mg in sodium chloride 0.9 % 25 mL IVPB  12.5 mg Intravenous Q8H PRN Patrecia Pour, MD       cinacalcet (SENSIPAR) tablet 60 mg  60 mg Oral Q M,W,F-HD Roney Jaffe, MD       Darbepoetin Alfa (ARANESP) injection 40 mcg  40 mcg Intravenous Q Wed-HD Roney Jaffe, MD   40 mcg at 03/29/21 0939   dorzolamide-timolol (COSOPT) 22.3-6.8 MG/ML ophthalmic solution 1 drop  1 drop Right Eye BID Patrecia Pour, MD   1 drop at 03/30/21 0805   doxercalciferol (HECTOROL) injection 3 mcg  3 mcg Intravenous Q M,W,F-HD Roney Jaffe, MD       gabapentin (NEURONTIN) capsule 100 mg  100 mg Oral Daily Vance Gather B, MD   100 mg at 03/29/21 2055   heparin injection 5,000 Units  5,000 Units Subcutaneous Q8H Shelly Coss, MD   5,000 Units at 03/30/21 9390   HYDROmorphone (DILAUDID) injection 0.5-1 mg  0.5-1 mg Intravenous Q3H PRN Vance Gather B, MD   1 mg at 03/29/21 1248   insulin aspart (novoLOG) injection 0-6 Units  0-6 Units Subcutaneous Q4H Vance Gather B, MD       metoprolol tartrate  (LOPRESSOR) tablet 12.5 mg  12.5 mg Oral BID Vance Gather B, MD   12.5 mg at 03/29/21 2055   Netarsudil-Latanoprost 0.02-0.005 % SOLN 1 drop  1 drop Right Eye QHS Patrecia Pour, MD       ondansetron [facility]) injection 4 mg  4 mg Intravenous Q6H PRN Patrecia Pour, MD   4 mg at 03/30/21 3009   oxyCODONE (Oxy IR/ROXICODONE) immediate release tablet 5 mg  5 mg Oral Q6H PRN Shelly Coss, MD       pilocarpine (PILOCAR) 4 % ophthalmic solution 1 drop  1 drop Right Eye QID Vance Gather B, MD   1 drop at 03/30/21 0805   piperacillin-tazobactam (ZOSYN) IVPB 2.25 g  2.25 g Intravenous Q8H Vance Gather B, MD 100 mL/hr at 03/30/21 0607 2.25 g at 03/30/21 0607   polyethylene glycol (MIRALAX / GLYCOLAX) packet 17 g  17 g Oral Daily Shelly Coss, MD  prochlorperazine (COMPAZINE) injection 10 mg  10 mg Intravenous Q6H PRN Shelly Coss, MD   10 mg at 03/29/21 1747   senna (SENOKOT) tablet 8.6 mg  1 tablet Oral BID Shelly Coss, MD       sodium chloride flush (NS) 0.9 % injection 5 mL  5 mL Intracatheter Q8H Corrie Mckusick, DO   5 mL at 03/30/21 0606     Discharge Medications: Please see discharge summary for a list of discharge medications.  Relevant Imaging Results:  Relevant Lab Results:   Additional Information [phone]  Joanne Chars, LCSW

## 2021-03-30 NOTE — Progress Notes (Signed)
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  She was recently admitted from 1/11-03/2020 for acute cholecystitis managed with percutaneous cholecystectomy tube which was unintentionally dislodged and subsequently removed on 1/19 and was discharged with oral antibiotics.  After being discharged to nursing facility, he developed severe abdominal pain with associated nausea and vomiting.  On presentation ,he was afebrile, lab work showed worsening leukocytosis.  Right upper quadrant ultrasound showed distended gallbladder with cholelithiasis, sludge, pericholecystic fluid.  Murphy sign was positive.  IR, general surgery consulted.  Underwent cholecystostomy tube placement by IR on 1/24.  Cultures have not shown any growth.  Possible discharge back to skilled nursing facility tomorrow  Assessment & Plan:   Principal Problem:   Acute cholecystitis Active Problems:   Type 2 diabetes mellitus with renal manifestations (HCC)   Essential hypertension   HLD (hyperlipidemia)   ESRD (end stage renal disease) (HCC)   Macrocytic anemia   RBBB   Blindness   S/P TAVR (transcatheter aortic valve replacement)   Anemia in chronic kidney disease   Secondary hyperparathyroidism of renal origin ([place])   PAD (peripheral artery disease) ([place])   DNR (do not resuscitate)   Pressure injury of skin   Acute cholecystitis:He was recently admitted from 1/11-03/2020 for acute cholecystitis managed with percutaneous cholecystectomy tube which was unintentionally dislodged and  subsequently removed on 1/19 and was discharged with oral antibiotics.  After being discharged to nursing facility, he developed severe abdominal pain with associated nausea and vomiting.  On presentation she was afebrile, lab work showed worsening leukocytosis.  Right upper quadrant ultrasound showed distended gallbladder with cholelithiasis, sludge, pericholecystic fluid.  Murphy sign was positive.  IR, general surgery consulted.  Underwent cholecystostomy tube placement by IR on 1/24.   Continue pain management supportive care.  Blood cultures no growth till date.  Aerobic/anaerobic culture have not shown any growth, no organisms.continue current antibiotics.  Leukocytosis will be monitored.  Plan for total of 10 days duration of antibiotics.  ESRD: On dialysis on MWF.  Nephrology following for dialysis  Bilateral pleural effusion: Moderate to large RIGHT and small LEFT pleural effusions with associated RIGHT basilar atelectasis as per CXR.  This finding was also present on last admission.  No respiratory symptoms.  Currently on room air.  We will check if he is a candidate for thoracentesis.  Volume management with dialysis  Paroxysmal A. fib: Currently in normal sinus rhythm.  Not on anticoagulation due to concern for GI bleed.  On low-dose metoprolol for rate control.  Monitor on telemetry  Non-insulin-dependent diabetes type 2: Recent A1c of 7%.  Monitor blood sugars.  Currently on sliding scale insulin.  On gabapentin for diabetic neuropathy.  History of glaucoma: Continue eyedrops  Parkinson's disease: Continue Sinemet.  Delirium precautions  History of peripheral vascular disease : status post left BKA.  Takes Plavix , continued  History of urinary retention: On last admission Foley was placed and he was discharged with Foley.  Continue Foley catheter for now.  We recommend to follow-up with urology as an outpatient  Macrocytic anemia: Associated with ESRD.  No evidence of bleeding.   Hemoglobin stable  Hyponatremia: Likely from volume overload.  Continue monitoring.  Volume management as per dialysis             DVT prophylaxis:Heparin [place] Code Status: DNR Family Communication:: Discussed with daughter on phone on 1/25 Patient status: Observation  Dispo: The patient is from: Skilled nursing facility              Anticipated d/c is to: Skilled nursing facility              Anticipated d/c date is: likely tomorrow  Consultants: IR, general surgery, nephrology  Procedures: None  Antimicrobials:  Anti-infectives (From admission, onward)    Start     Dose/Rate Route Frequency Ordered Stop   03/30/21 1245  fluconazole (DIFLUCAN) tablet 100 mg        100 mg Oral Daily 03/30/21 1157 04/06/21 0959   03/28/21 1645  cefOXitin (MEFOXIN) 2 g in sodium chloride 0.9 % 100 mL IVPB       Note to Pharmacy: Please send to tube station 17 radiology- for procedure   2 g 200 mL/hr over 30 Minutes Intravenous  Once 03/28/21 1550 03/28/21 1720   03/27/21 1645  vancomycin (VANCOREADY) IVPB 1500 mg/300 mL        1,500 mg 150 mL/hr over 120 Minutes Intravenous  Once 03/27/21 1632 03/27/21 1942   03/27/21 1630  piperacillin-tazobactam (ZOSYN) IVPB 2.25 g        2.25 g 100 mL/hr over 30 Minutes Intravenous Every 8 hours 03/27/21 1617         Subjective: Patient seen and examined at the bedside this morning.  Hemodynamically stable.  Complains of some pain on the drain site but overall comfortable.  Complains of lack of bowel movement.  Wants to advance his diet.  Objective: Vitals:   03/29/21 1938 03/30/21 0427 03/30/21 0717 03/30/21 1200  BP: (!) 144/68 132/66 (!) 144/59 (!) 119/54  Pulse: 93 72 77 67  Resp: 16 20  15   Temp: 98.3 F (36.8 C) 98 F (36.7 C) 97.7 F (36.5 C) 98 F (36.7 C)  TempSrc: Oral Oral Oral Oral  SpO2: 97% 100% 99%   Weight:      Height:        Intake/Output Summary (Last 24 hours) at 03/30/2021 1338 Last data filed at 03/30/2021  0707 Gross per 24 hour  Intake 120 ml  Output 5 ml  Net 115 ml   Filed Weights   03/28/21 1748 03/29/21 0739 03/29/21 1109  Weight: 74.4 kg 70.3 kg 68.2 kg    Examination:   General exam: Deconditioned, chronically looking, overall comfortable HEENT: Legally blind Respiratory system:  no wheezes or crackles  Cardiovascular system: S1 & S2 heard, RRR.  Gastrointestinal system: Abdomen is nondistended, soft and nontender.  Mild tenderness in the right upper quadrant.  Right upper quadrant drain with sanguinous output Central nervous system: Alert and oriented Extremities: No edema, no clubbing ,no cyanosis, AV fistula on the left upper extremity Skin: No rashes, no ulcers,no icterus, left BKA  Data Reviewed: I have personally reviewed following labs and imaging studies  CBC: Recent Labs  Lab 03/26/21 0346 03/27/21 1143 03/28/21 0319 03/29/21 0346 03/30/21 0241  WBC 12.6* 15.7* 13.7* 14.4* 17.1*  NEUTROABS  --   --   --  12.0* 14.7*  HGB 8.7* 11.4* 8.2* 8.4* 8.8*  HCT 25.1* 33.7* 24.7* 25.1* 25.7*  MCV 101.6* 103.1* 101.6* 100.8* 101.6*  PLT 403*  490* 438* 427* 914*   Basic Metabolic Panel: Recent Labs  Lab 03/26/21 0346 03/27/21 1143 03/28/21 0319 03/29/21 0346 03/30/21 0241  NA 132* 131* 128* 128* 132*  K 4.0 4.6 3.9 4.2 4.1  CL 96* 93* 94* 95* 95*  CO2 26 22 25  21* 23  GLUCOSE 104* 123* 92 84 76  BUN 25* 35* 37* 43* 29*  CREATININE 4.18* 5.60* 6.13* 7.04* 5.55*  CALCIUM 8.2* 8.8* 8.2* 8.3* 9.1  PHOS  --   --  4.9*  --   --    GFR: Estimated Creatinine Clearance: 8.9 mL/min (A) (by C-G formula based on SCr of 5.55 mg/dL (H)). Liver Function Tests: Recent Labs  Lab 03/26/21 0346 03/27/21 1143 03/28/21 0319  AST 18 35 122*  ALT 6 6 8   ALKPHOS 135* 191* 409*  BILITOT 0.7 1.0 0.9  PROT 5.3* 6.8 5.3*  ALBUMIN 1.7* 2.4* 1.7*   Recent Labs  Lab 03/27/21 1143  LIPASE 30   No results for input(s): AMMONIA in the last 168 hours. Coagulation  Profile: Recent Labs  Lab 03/28/21 0319  INR 1.2   Cardiac Enzymes: No results for input(s): CKTOTAL, CKMB, CKMBINDEX, TROPONINI in the last 168 hours. BNP (last 3 results) No results for input(s): PROBNP in the last 8760 hours. HbA1C: No results for input(s): HGBA1C in the last 72 hours. CBG: Recent Labs  Lab 03/30/21 0010 03/30/21 0432 03/30/21 0547 03/30/21 0744 03/30/21 1143  GLUCAP 82 66* 79 97 116*   Lipid Profile: No results for input(s): CHOL, HDL, LDLCALC, TRIG, CHOLHDL, LDLDIRECT in the last 72 hours. Thyroid Function Tests: No results for input(s): TSH, T4TOTAL, FREET4, T3FREE, THYROIDAB in the last 72 hours. Anemia Panel: No results for input(s): VITAMINB12, FOLATE, FERRITIN, TIBC, IRON, RETICCTPCT in the last 72 hours. Sepsis Labs: No results for input(s): PROCALCITON, LATICACIDVEN in the last 168 hours.  Recent Results (from the past 240 hour(s))  Resp Panel by RT-PCR (Flu A&B, Covid) Nasopharyngeal Swab     Status: None   Collection Time: 03/22/21 12:42 PM   Specimen: Nasopharyngeal Swab; Nasopharyngeal(NP) swabs in vial transport medium  Result Value Ref Range Status   SARS Coronavirus 2 by RT PCR NEGATIVE NEGATIVE Final    Comment: (NOTE) SARS-CoV-2 target nucleic acids are NOT DETECTED.  The SARS-CoV-2 RNA is generally detectable in upper respiratory specimens during the acute phase of infection. The lowest concentration of SARS-CoV-2 viral copies this assay can detect is 138 copies/mL. A negative result does not preclude SARS-Cov-2 infection and should not be used as the sole basis for treatment or other patient management decisions. A negative result may occur with  improper specimen collection/handling, submission of specimen other than nasopharyngeal swab, presence of viral mutation(s) within the areas targeted by this assay, and inadequate number of viral copies(<138 copies/mL). A negative result must be combined with clinical observations,  patient history, and epidemiological information. The expected result is Negative.  Fact Sheet for Patients:  EntrepreneurPulse.com.au  Fact Sheet for Healthcare Providers:  IncredibleEmployment.be  This test is no t yet approved or cleared by the [place] FDA and  has been authorized for detection and/or diagnosis of SARS-CoV-2 by FDA under an Emergency Use Authorization (EUA). This EUA will remain  in effect (meaning this test can be used) for the duration of the COVID-19 declaration under Section 564(b)(1) of the Act, 21 U.S.C.section 360bbb-3(b)(1), unless the authorization is terminated  or revoked sooner.       Influenza A by PCR NEGATIVE  NEGATIVE Final   Influenza B by PCR NEGATIVE NEGATIVE Final    Comment: (NOTE) The Xpert Xpress SARS-CoV-2/FLU/RSV plus assay is intended as an aid in the diagnosis of influenza from Nasopharyngeal swab specimens and should not be used as a sole basis for treatment. Nasal washings and aspirates are unacceptable for Xpert Xpress SARS-CoV-2/FLU/RSV testing.  Fact Sheet for Patients: EntrepreneurPulse.com.au  Fact Sheet for Healthcare Providers: IncredibleEmployment.be  This test is not yet approved or cleared by the [place] FDA and has been authorized for detection and/or diagnosis of SARS-CoV-2 by FDA under an Emergency Use Authorization (EUA). This EUA will remain in effect (meaning this test can be used) for the duration of the COVID-19 declaration under Section 564(b)(1) of the Act, 21 U.S.C. section 360bbb-3(b)(1), unless the authorization is terminated or revoked.  Performed at [facility], [place] [address] [place], [place] [address]   Resp Panel by RT-PCR (Flu A&B, Covid) Nasopharyngeal Swab     Status: None   Collection Time: 03/26/21 11:56 AM   Specimen: Nasopharyngeal Swab; Nasopharyngeal(NP) swabs in vial transport medium  Result  Value Ref Range Status   SARS Coronavirus 2 by RT PCR NEGATIVE NEGATIVE Final    Comment: (NOTE) SARS-CoV-2 target nucleic acids are NOT DETECTED.  The SARS-CoV-2 RNA is generally detectable in upper respiratory specimens during the acute phase of infection. The lowest concentration of SARS-CoV-2 viral copies this assay can detect is 138 copies/mL. A negative result does not preclude SARS-Cov-2 infection and should not be used as the sole basis for treatment or other patient management decisions. A negative result may occur with  improper specimen collection/handling, submission of specimen other than nasopharyngeal swab, presence of viral mutation(s) within the areas targeted by this assay, and inadequate number of viral copies(<138 copies/mL). A negative result must be combined with clinical observations, patient history, and epidemiological information. The expected result is Negative.  Fact Sheet for Patients:  EntrepreneurPulse.com.au  Fact Sheet for Healthcare Providers:  IncredibleEmployment.be  This test is no t yet approved or cleared by the [place] FDA and  has been authorized for detection and/or diagnosis of SARS-CoV-2 by FDA under an Emergency Use Authorization (EUA). This EUA will remain  in effect (meaning this test can be used) for the duration of the COVID-19 declaration under Section 564(b)(1) of the Act, 21 U.S.C.section 360bbb-3(b)(1), unless the authorization is terminated  or revoked sooner.       Influenza A by PCR NEGATIVE NEGATIVE Final   Influenza B by PCR NEGATIVE NEGATIVE Final    Comment: (NOTE) The Xpert Xpress SARS-CoV-2/FLU/RSV plus assay is intended as an aid in the diagnosis of influenza from Nasopharyngeal swab specimens and should not be used as a sole basis for treatment. Nasal washings and aspirates are unacceptable for Xpert Xpress SARS-CoV-2/FLU/RSV testing.  Fact Sheet for  Patients: EntrepreneurPulse.com.au  Fact Sheet for Healthcare Providers: IncredibleEmployment.be  This test is not yet approved or cleared by the [place] FDA and has been authorized for detection and/or diagnosis of SARS-CoV-2 by FDA under an Emergency Use Authorization (EUA). This EUA will remain in effect (meaning this test can be used) for the duration of the COVID-19 declaration under Section 564(b)(1) of the Act, 21 U.S.C. section 360bbb-3(b)(1), unless the authorization is terminated or revoked.  Performed at [facility], [place] [address], [place], [place] [address]   Resp Panel by RT-PCR (Flu A&B, Covid) Nasopharyngeal Swab     Status: None   Collection Time:  03/27/21  2:10 PM   Specimen: Nasopharyngeal Swab; Nasopharyngeal(NP) swabs in vial transport medium  Result Value Ref Range Status   SARS Coronavirus 2 by RT PCR NEGATIVE NEGATIVE Final    Comment: (NOTE) SARS-CoV-2 target nucleic acids are NOT DETECTED.  The SARS-CoV-2 RNA is generally detectable in upper respiratory specimens during the acute phase of infection. The lowest concentration of SARS-CoV-2 viral copies this assay can detect is 138 copies/mL. A negative result does not preclude SARS-Cov-2 infection and should not be used as the sole basis for treatment or other patient management decisions. A negative result may occur with  improper specimen collection/handling, submission of specimen other than nasopharyngeal swab, presence of viral mutation(s) within the areas targeted by this assay, and inadequate number of viral copies(<138 copies/mL). A negative result must be combined with clinical observations, patient history, and epidemiological information. The expected result is Negative.  Fact Sheet for Patients:  EntrepreneurPulse.com.au  Fact Sheet for Healthcare Providers:  IncredibleEmployment.be  This test is no t yet  approved or cleared by the [place] FDA and  has been authorized for detection and/or diagnosis of SARS-CoV-2 by FDA under an Emergency Use Authorization (EUA). This EUA will remain  in effect (meaning this test can be used) for the duration of the COVID-19 declaration under Section 564(b)(1) of the Act, 21 U.S.C.section 360bbb-3(b)(1), unless the authorization is terminated  or revoked sooner.       Influenza A by PCR NEGATIVE NEGATIVE Final   Influenza B by PCR NEGATIVE NEGATIVE Final    Comment: (NOTE) The Xpert Xpress SARS-CoV-2/FLU/RSV plus assay is intended as an aid in the diagnosis of influenza from Nasopharyngeal swab specimens and should not be used as a sole basis for treatment. Nasal washings and aspirates are unacceptable for Xpert Xpress SARS-CoV-2/FLU/RSV testing.  Fact Sheet for Patients: EntrepreneurPulse.com.au  Fact Sheet for Healthcare Providers: IncredibleEmployment.be  This test is not yet approved or cleared by the [place] FDA and has been authorized for detection and/or diagnosis of SARS-CoV-2 by FDA under an Emergency Use Authorization (EUA). This EUA will remain in effect (meaning this test can be used) for the duration of the COVID-19 declaration under Section 564(b)(1) of the Act, 21 U.S.C. section 360bbb-3(b)(1), unless the authorization is terminated or revoked.  Performed at [facility], [place] [address], [place], [place] [address]   Culture, blood (routine x 2)     Status: None (Preliminary result)   Collection Time: 03/27/21  4:03 PM   Specimen: BLOOD  Result Value Ref Range Status   Specimen Description BLOOD BLOOD RIGHT ARM  Final   Special Requests   Final    BOTTLES DRAWN AEROBIC AND ANAEROBIC Blood Culture results may not be optimal due to an excessive volume of blood received in culture bottles   Culture   Final    NO GROWTH 3 DAYS Performed at [facility], [place] [address] [place], [place] [address]    Report Status PENDING  Incomplete  Culture, blood (routine x 2)     Status: None (Preliminary result)   Collection Time: 03/27/21  6:49 PM   Specimen: BLOOD RIGHT HAND  Result Value Ref Range Status   Specimen Description BLOOD RIGHT HAND  Final   Special Requests   Final    BOTTLES DRAWN AEROBIC AND ANAEROBIC Blood Culture adequate volume   Culture   Final    NO GROWTH 3 DAYS Performed at [facility], [place]  [address], [place], [place] [address]    Report Status PENDING  Incomplete  Aerobic/Anaerobic Culture w Gram Stain (surgical/deep wound)     Status: None (Preliminary result)   Collection Time: 03/28/21  5:10 PM   Specimen: Abscess  Result Value Ref Range Status   Specimen Description ABSCESS  Final   Special Requests GALLBLADDER  Final   Gram Stain   Final    FEW WBC PRESENT,BOTH PMN AND MONONUCLEAR NO ORGANISMS SEEN    Culture   Final    NO GROWTH 2 DAYS NO ANAEROBES ISOLATED; CULTURE IN PROGRESS FOR 5 DAYS Performed at [facility], [place] [address] [place], [place] [address]    Report Status PENDING  Incomplete         Radiology Studies: NM Hepatobiliary Liver Func  Result Date: 03/28/2021 CLINICAL DATA:  Ultrasound CT findings concern for acute cholecystitis. EXAM: NUCLEAR MEDICINE HEPATOBILIARY IMAGING TECHNIQUE: Sequential images of the abdomen were obtained out to 60 minutes following intravenous administration of radiopharmaceutical. RADIOPHARMACEUTICALS:  5.3 mCi Tc-83m  Choletec IV COMPARISON:  Ultrasound 03/27/2021, CT 03/23/2021 FINDINGS: Prompt clearance radiotracer from blood pool and homogeneous uptake within liver. Counts are present within the common bile duct by 15 minutes. Counts reached the proximal small bowel by 20 minutes. No filling of the gallbladder at 60 minutes. IV morphine was administered to augment filling of the gallbladder. No filling of the gallbladder present on 30 minutes of post morphine imaging.  IMPRESSION: Non filling the gallbladder is consistent with ACUTE CHOLECYSTITIS. Patent common bile duct. These results will be called to the ordering clinician or representative by the Radiologist Assistant, and communication documented in the PACS or [employer]. Electronically Signed   By: Suzy Bouchard M.D.   On: 03/28/2021 16:02   IR Perc Cholecystostomy  Result Date: 03/28/2021 INDICATION: 81 year old male referred for percutaneous cholecystostomy EXAM: CHOLECYSTOSTOMY MEDICATIONS: 2 g Mefoxin; The antibiotic was administered within an appropriate time frame prior to the initiation of the procedure. ANESTHESIA/SEDATION: Moderate (conscious) sedation was employed during this procedure. A total of Versed 1.0 mg and Fentanyl 25 mcg was administered intravenously. Moderate Sedation Time: 13 minutes. The patient's level of consciousness and vital signs were monitored continuously by radiology nursing throughout the procedure under my direct supervision. FLUOROSCOPY TIME:  Fluoroscopy Time: 0 minutes 42 seconds (3 mGy). COMPLICATIONS: None PROCEDURE: Informed written consent was obtained from the patient and the patient's family after a thorough discussion of the procedural risks, benefits and alternatives. All questions were addressed. Maximal Sterile Barrier Technique was utilized including caps, mask, sterile gowns, sterile gloves, sterile drape, hand hygiene and skin antiseptic. A timeout was performed prior to the initiation of the procedure. Ultrasound survey of the right upper quadrant was performed for planning purposes. Once the patient is prepped and draped in the usual sterile fashion, the skin and subcutaneous tissues overlying the gallbladder were generously infiltrated 1% lidocaine for local anesthesia. A coaxial needle was advanced under ultrasound guidance through the skin subcutaneous tissues and a small segment of liver into the gallbladder lumen. With removal of the stylet, spontaneous  dark bile drainage occurred. Using modified Seldinger technique, a 10 French drain was placed into the gallbladder fossa, with aspiration of the sample for the lab. Contrast injection confirmed position of the tube within the gallbladder lumen. Drainage catheter was attached to gravity drain with a suture retention placed. Patient tolerated the procedure well and remained hemodynamically stable throughout. No complications were encountered and no significant blood loss encountered.  IMPRESSION: Status post image guided percutaneous cholecystostomy. Signed, Dulcy Fanny. Dellia Nims, RPVI [facility] Radiology Electronically Signed   By: Corrie Mckusick D.O.   On: 03/28/2021 17:26   DG Abd Portable 1V  Result Date: 03/29/2021 CLINICAL DATA:  Emesis for 2-3 days, history of cancer and diabetes in an 80 year old male. EXAM: PORTABLE ABDOMEN - 1 VIEW COMPARISON:  CT of March 23, 2021. FINDINGS: A percutaneous cholecystostomy tube is partially visualized projecting over the RIGHT lower quadrant. RIGHT and LEFT hemidiaphragmatic contours are excluded from view. Imaging excludes the upper abdomen on today's study. There is no sign of bowel obstruction. Small amount of stool and gas present in the rectum. Brachytherapy seeds are present in the prostate. On limited assessment there is no acute regional skeletal process. IMPRESSION: 1. No sign of bowel obstruction. Imaging excludes the upper abdomen on today's study. 2. Cholecystostomy tube partially visualized projecting over the RIGHT lower quadrant. Electronically Signed   By: Zetta Bills M.D.   On: 03/29/2021 14:47        Scheduled Meds:  brimonidine  1 drop Right Eye TID   carbidopa-levodopa  1 tablet Oral TID   Chlorhexidine Gluconate Cloth  6 each Topical Daily   Chlorhexidine Gluconate Cloth  6 each Topical Q0600   cinacalcet  60 mg Oral Q M,W,F-HD   darbepoetin (ARANESP) injection - DIALYSIS  40 mcg  Intravenous Q Wed-HD   dorzolamide-timolol  1 drop Right Eye BID   doxercalciferol  3 mcg Intravenous Q M,W,F-HD   fluconazole  100 mg Oral Daily   gabapentin  100 mg Oral Daily   heparin injection (subcutaneous)  5,000 Units Subcutaneous Q8H   insulin aspart  0-6 Units Subcutaneous Q4H   metoprolol tartrate  12.5 mg Oral BID   Netarsudil-Latanoprost  1 drop Right Eye QHS   pilocarpine  1 drop Right Eye QID   polyethylene glycol  17 g Oral Daily   senna  1 tablet Oral BID   sodium chloride flush  5 mL Intracatheter Q8H   Continuous Infusions:  chlorproMAZINE (THORAZINE) IV     piperacillin-tazobactam (ZOSYN)  IV 2.25 g (03/30/21 1316)     LOS: 2 days       Shelly Coss, MD [facility] P1/26/2023, 1:38 PM

## 2021-03-30 NOTE — TOC Initial Note (Signed)
Transition of Care [facility]) - Initial/Assessment Note    Patient Details  Name: Elijah White MRN: [id number] Date of Birth: 1940/03/06  Transition of Care [facility]) CM/SW Contact:    Joanne Chars, LCSW Phone Number: 03/30/2021, 10:20 AM  Clinical Narrative:   Pt in long term care at [facility].  CSW met with pt in room, he confirms this, does plan to return at DC.  Permission given to speak with daughter [name].  No other concenrs.    CSW confirmed with Lexine Baton that pt is LTC resident there.                  Expected Discharge Plan: Long Term Nursing Home Barriers to Discharge: Continued Medical Work up   Patient Goals and CMS Choice Patient states their goals for this hospitalization and ongoing recovery are:: "get back there" (to [employer]) [employer] Compare Post Acute Care list provided to::  (NA-pt from [facility], wants to return)    Expected Discharge Plan and Services Expected Discharge Plan: [place] Acute Care Choice: [place] Living arrangements for the past 2 months: [place]                                      Prior Living Arrangements/Services Living arrangements for the past 2 months: [place] Lives with:: Facility Resident Patient language and need for interpreter reviewed:: Yes Do you feel safe going back to the place where you live?: Yes      Need for Family Participation in Patient Care: Yes (Comment) Care giver support system in place?: Yes (comment) Current home services: Other (comment) (none) Criminal Activity/Legal Involvement Pertinent to Current Situation/Hospitalization: No - Comment as needed  Activities of Daily Living Home Assistive Devices/Equipment: Trapeze ADL Screening (condition at time of admission) Patient's cognitive ability adequate to safely complete daily activities?: No Is the patient deaf or have difficulty hearing?: No Does the patient  have difficulty seeing, even when wearing glasses/contacts?: Yes Does the patient have difficulty concentrating, remembering, or making decisions?: Yes Patient able to express need for assistance with ADLs?: Yes Does the patient have difficulty dressing or bathing?: Yes Independently performs ADLs?: No Communication: Independent Dressing (OT): Dependent Is this a change from baseline?: Pre-admission baseline Grooming: Dependent Is this a change from baseline?: Pre-admission baseline Feeding: Needs assistance Is this a change from baseline?: Pre-admission baseline Bathing: Dependent Is this a change from baseline?: Pre-admission baseline Toileting: Dependent Is this a change from baseline?: Pre-admission baseline In/Out Bed: Dependent Is this a change from baseline?: Pre-admission baseline Walks in Home:  (L BKA) Does the patient have difficulty walking or climbing stairs?: Yes Weakness of Legs: Both (LBKA) Weakness of Arms/Hands: None  Permission Sought/Granted Permission sought to share information with : Family Supports Permission granted to share information with : Yes, Verbal Permission Granted  Share Information with NAME: daughter [name]  Permission granted to share info w AGENCY: Health and safety inspector        Emotional Assessment Appearance:: Appears stated age Attitude/Demeanor/Rapport: Lethargic Affect (typically observed): Appropriate Orientation: : Oriented to Self, Oriented to Place, Oriented to  Time, Oriented to Situation Alcohol / Substance Use: Not Applicable Psych Involvement: No (comment)  Admission diagnosis:  Acute cholecystitis [K81.0] Abdominal pain [R10.9] Patient Active Problem List   Diagnosis Date Noted   Pressure injury of skin 03/28/2021  PAD (peripheral artery disease) ([place])    DNR (do not resuscitate)    Complicated UTI (urinary tract infection)    Acute cholecystitis 03/15/2021   Hypokalemia 09/28/2020   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[place])  08/23/2020   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, 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[place]) 08/12/2020   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   Acute on chronic diastolic heart failure ([place]) 01/19/2020   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   RBBB    Hypercalcemia 12/16/2019   Near syncope 12/13/2019   Moderate protein-calorie malnutrition ([place]) 12/04/2019   Severe aortic stenosis    ESRD (end stage renal disease) ([place]) 11/26/2019   Macrocytic anemia 11/26/2019   Adrenal mass ([place]) 11/26/2019   Colonic mass 11/26/2019   Allergy, unspecified, initial encounter 11/16/2019   Anaphylactic shock, unspecified, initial encounter 11/16/2019   Other long term (current) drug therapy 09/05/2019   Iron deficiency anemia, unspecified 07/13/2019   Encounter for immunization 04/17/2019   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   Dependence on renal dialysis ([place]) 04/16/2019   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 kidney disease, or unspecified chronic kidney disease 04/16/2019   Localized edema 04/16/2019   Malignant neoplasm of prostate ([place]) 04/16/2019   Nicotine dependence, unspecified, uncomplicated 41/59/7331   Other disorders of phosphorus metabolism 04/16/2019   Pain, unspecified 04/16/2019   Pruritus, unspecified 04/16/2019   Secondary hyperparathyroidism of renal origin ([place]) 04/16/2019   Shortness of breath 04/16/2019   Unspecified glaucoma 04/16/2019   Unspecified urinary incontinence 04/16/2019   ARF (acute renal failure) ([place]) 03/25/2018   Vision loss of right eye 11/11/2017   Bilateral pseudophakia 07/11/2017   GERD (gastroesophageal reflux disease) 09/10/2016   HLD (hyperlipidemia) 09/10/2016   Essential hypertension 08/13/2016   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   Type 2 diabetes mellitus with renal manifestations ([place]) 10/22/2006   BPH (benign prostatic hyperplasia) 10/22/2006   COLONIC POLYPS, HX OF 10/22/2006   PCP:  Maryella Shivers,  MD Pharmacy:   [facility] [place], [place] - [phone] [address] [place] [address] Phone: [phone] Fax: [phone]     Social Determinants of Health (SDOH) Interventions    Readmission Risk Interventions Readmission Risk Prevention Plan 03/17/2021  Transportation Screening Complete  HRI or Home Care Consult Complete  SW Recovery Care/Counseling Consult Complete  Skilled Nursing Facility Complete  Some recent data might be hidden

## 2021-03-30 NOTE — Progress Notes (Signed)
[facility] Progress Note   Interim Summary: pt w/ ESRD on HD, blindness, DNR, sp TAVR, CAD, L BKA, DM2 was admitted 1/11 for acute cholecystitis rx'd w/ perc chole tube (placed 1/12 - 1/17 removed), also had new afib and urine retention. Pt was dc'd on 1/22 to complete po abx course. He now has worsening abd pain whole abdomen, worse in RUQ and n/v. WBC 15k in ED. U/S showed distended GB, +stones and sludge, +murphy's sign. IR and gen surg consulted, HIDA recommended. Asked to see for ESRD.    Subjective:  2.6 L off w/ HD yest, no c/o's today. Abd pain better. C/o butt pain  Objective Vitals:   03/29/21 1938 03/30/21 0427 03/30/21 0717 03/30/21 1200  BP: (!) 144/68 132/66 (!) 144/59 (!) 119/54  Pulse: 93 72 77 67  Resp: 16 20  15   Temp: 98.3 F (36.8 C) 98 F (36.7 C) 97.7 F (36.5 C) 98 F (36.7 C)  TempSrc: Oral Oral Oral Oral  SpO2: 97% 100% 99%   Weight:      Height:       Physical Exam General: Blind, no distress, seen in bed Heart: Normal S1 and S2; No murmurs, gallops, or rubs Lungs: Clear anteriorly and laterally Abdomen: Soft and non-tender, R flank drain in place Extremities: 1+ pitting edema bilat LEs Dialysis Access: L AVF (+) Bruit/Thrill     Dialysis Orders: [place] [facility] MWF  4h  400/600  66kg 2/2 bath  P2  AVF  Hep none  - sensipar 60 mg po tiw  - hectorol 3 ug tiw IV  - mircera 75 ug q 2 wks, just ordered  Assessment/Plan: Abdominal pain: recent admit for acute cholecystitis, S/P perc chole tube 1/12- 1/17 per IR, dc'd on 1/22. Readmitted now w/ abd pain, and SP new perc chole tube by IR yesterday 1/24. Per pmd / IR. Getting IV abx.  Atrial fib - recent onset, per pmd  ESRD - on HD MWF. HD today on schedule. Next HD Friday.  BP/volume  - BP's up, was down to 62 kg during recent admit. Down to 68kg post HD 1/25. Cont to lower vol as tolerated.  Anemia ckd  - Hb 8.2, started ESA last admit darbe 40 weekly, will continue.  Metabolic bone disease -   changed auryxia to Renvela binders 2 ac last admit, phos 4.9. Continue sensipar and vdra. Ca in range.  Nutrition - Albumin <2. Protein supps ordered. Renal diet.  Parkinson's disease-per primary DMT2-per primary H/O TAVR DNR   Kelly Splinter, MD 03/30/2021, 12:28 PM       Filed Weights   03/28/21 1748 03/29/21 0739 03/29/21 1109  Weight: 74.4 kg 70.3 kg 68.2 kg    Intake/Output Summary (Last 24 hours) at 03/30/2021 1228 Last data filed at 03/29/2021 2052 Gross per 24 hour  Intake 120 ml  Output 0 ml  Net 120 ml     Additional Objective Labs: Basic Metabolic Panel: Recent Labs  Lab 03/28/21 0319 03/29/21 0346 03/30/21 0241  NA 128* 128* 132*  K 3.9 4.2 4.1  CL 94* 95* 95*  CO2 25 21* 23  GLUCOSE 92 84 76  BUN 37* 43* 29*  CREATININE 6.13* 7.04* 5.55*  CALCIUM 8.2* 8.3* 9.1  PHOS 4.9*  --   --     Liver Function Tests: Recent Labs  Lab 03/26/21 0346 03/27/21 1143 03/28/21 0319  AST 18 35 122*  ALT 6 6 8   ALKPHOS 135* 191* 409*  BILITOT 0.7  1.0 0.9  PROT 5.3* 6.8 5.3*  ALBUMIN 1.7* 2.4* 1.7*    Recent Labs  Lab 03/27/21 1143  LIPASE 30    CBC: Recent Labs  Lab 03/26/21 0346 03/27/21 1143 03/28/21 0319 03/29/21 0346 03/30/21 0241  WBC 12.6* 15.7* 13.7* 14.4* 17.1*  NEUTROABS  --   --   --  12.0* 14.7*  HGB 8.7* 11.4* 8.2* 8.4* 8.8*  HCT 25.1* 33.7* 24.7* 25.1* 25.7*  MCV 101.6* 103.1* 101.6* 100.8* 101.6*  PLT 403* 490* 438* 427* 453*    Blood Culture    Component Value Date/Time   SDES ABSCESS 03/28/2021 1710   SPECREQUEST GALLBLADDER 03/28/2021 1710   CULT  03/28/2021 1710    NO GROWTH 2 DAYS NO ANAEROBES ISOLATED; CULTURE IN PROGRESS FOR 5 DAYS Performed at [facility], [place] [address], [place], [place] [address]    REPTSTATUS PENDING 03/28/2021 1710    Cardiac Enzymes: No results for input(s): CKTOTAL, CKMB, CKMBINDEX, TROPONINI in the last 168 hours. CBG: Recent Labs  Lab 03/30/21 0010 03/30/21 0432 03/30/21 0547  03/30/21 0744 03/30/21 1143  GLUCAP 82 66* 79 97 116*    Iron Studies:  No results for input(s): IRON, TIBC, TRANSFERRIN, FERRITIN in the last 72 hours.  Lab Results  Component Value Date   INR 1.2 03/28/2021   INR 1.0 01/14/2020   INR 1.0 11/26/2019    Medications:  chlorproMAZINE (THORAZINE) IV     piperacillin-tazobactam (ZOSYN)  IV 2.25 g (03/30/21 0607)    brimonidine  1 drop Right Eye TID   carbidopa-levodopa  1 tablet Oral TID   Chlorhexidine Gluconate Cloth  6 each Topical Daily   Chlorhexidine Gluconate Cloth  6 each Topical Q0600   cinacalcet  60 mg Oral Q M,W,F-HD   darbepoetin (ARANESP) injection - DIALYSIS  40 mcg Intravenous Q Wed-HD   dorzolamide-timolol  1 drop Right Eye BID   doxercalciferol  3 mcg Intravenous Q M,W,F-HD   fluconazole  100 mg Oral Daily   gabapentin  100 mg Oral Daily   heparin injection (subcutaneous)  5,000 Units Subcutaneous Q8H   insulin aspart  0-6 Units Subcutaneous Q4H   metoprolol tartrate  12.5 mg Oral BID   Netarsudil-Latanoprost  1 drop Right Eye QHS   pilocarpine  1 drop Right Eye QID   polyethylene glycol  17 g Oral Daily   senna  1 tablet Oral BID   sodium chloride flush  5 mL Intracatheter Q8H

## 2021-03-31 ENCOUNTER — Inpatient Hospital Stay (HOSPITAL_COMMUNITY): Payer: Medicare Other

## 2021-03-31 LAB — CBC WITH DIFFERENTIAL/PLATELET
Abs Immature Granulocytes: 0.1 10*3/uL — ABNORMAL HIGH (ref 0.00–0.07)
Basophils Absolute: 0.1 10*3/uL (ref 0.0–0.1)
Basophils Relative: 1 %
Eosinophils Absolute: 0.4 10*3/uL (ref 0.0–0.5)
Eosinophils Relative: 3 %
HCT: 28.6 % — ABNORMAL LOW (ref 39.0–52.0)
Hemoglobin: 9.5 g/dL — ABNORMAL LOW (ref 13.0–17.0)
Immature Granulocytes: 1 %
Lymphocytes Relative: 6 %
Lymphs Abs: 0.8 10*3/uL (ref 0.7–4.0)
MCH: 33.6 pg (ref 26.0–34.0)
MCHC: 33.2 g/dL (ref 30.0–36.0)
MCV: 101.1 fL — ABNORMAL HIGH (ref 80.0–100.0)
Monocytes Absolute: 1.3 10*3/uL — ABNORMAL HIGH (ref 0.1–1.0)
Monocytes Relative: 9 %
Neutro Abs: 11 10*3/uL — ABNORMAL HIGH (ref 1.7–7.7)
Neutrophils Relative %: 80 %
Platelets: 506 10*3/uL — ABNORMAL HIGH (ref 150–400)
RBC: 2.83 MIL/uL — ABNORMAL LOW (ref 4.22–5.81)
RDW: 14.2 % (ref 11.5–15.5)
WBC: 13.7 10*3/uL — ABNORMAL HIGH (ref 4.0–10.5)
nRBC: 0 % (ref 0.0–0.2)

## 2021-03-31 LAB — HEPATIC FUNCTION PANEL
ALT: 22 U/L (ref 0–44)
AST: 20 U/L (ref 15–41)
Albumin: 3.4 g/dL — ABNORMAL LOW (ref 3.5–5.0)
Alkaline Phosphatase: 133 U/L — ABNORMAL HIGH (ref 38–126)
Bilirubin, Direct: <0.1 mg/dL (ref 0.0–0.2)
Total Bilirubin: 0.4 mg/dL (ref 0.3–1.2)
Total Protein: 7.2 g/dL (ref 6.5–8.1)

## 2021-03-31 LAB — GLUCOSE, CAPILLARY
Glucose-Capillary: 66 mg/dL — ABNORMAL LOW (ref 70–99)
Glucose-Capillary: 112 mg/dL — ABNORMAL HIGH (ref 70–99)
Glucose-Capillary: 102 mg/dL — ABNORMAL HIGH (ref 70–99)

## 2021-03-31 LAB — LIPASE, BLOOD: Lipase: 29 U/L (ref 11–51)

## 2021-03-31 MED ORDER — PANTOPRAZOLE SODIUM 40 MG PO TBEC
40.0000 mg | DELAYED_RELEASE_TABLET | Freq: Every day | ORAL | Status: DC
  Administered 2021-03-31 – 2021-04-05 (×6): 40 mg via ORAL
  Filled 2021-03-31 (×6): qty 1

## 2021-03-31 MED ORDER — FLUCONAZOLE 100MG IVPB
100.0000 mg | INTRAVENOUS | Status: DC
  Administered 2021-04-01: 100 mg via INTRAVENOUS
  Filled 2021-03-31 (×3): qty 50

## 2021-03-31 MED ORDER — GLUCOSE 40 % PO GEL
ORAL | Status: AC
  Filled 2021-03-31: qty 1

## 2021-03-31 NOTE — Care Management Important Message (Signed)
Important Message  Patient Details  Name: Elijah White MRN: [id number] Date of Birth: 04/26/1940   Medicare Important Message Given:  Yes     [name] 03/31/2021, 12:21 PM

## 2021-03-31 NOTE — Progress Notes (Signed)
Subjective: CC: Asked to see back for nausea  Patient reports that he has been intermittently nauseous during hospitalization.  He reports this usually occurs 10-15 minutes after he takes medications by mouth or eats.  Sometimes he will feel like things get stuck in his throat but eventually will go down.  Most the time this is not occur however does not feel this is related to what is causing his symptoms. He is not sure if there is any particular medications that cause this as he states he is not sure all the medications he is receiving.  He reports some LUQ/epigastric pain that is mild and more feels more like distention.  He just had emesis prior to me entering the room and reports that it worsened slightly since that time.  He denies any RUQ pain.  His perc chole drain currently has bloody fluid in the gravity bag and appears to be a little less than 50 cc.  Discussed with the nurse who reports that it has not been emptied today.  He also states he was unaware that she be being flushed is unsure if this was done on other shifts.  No output recorded 2 days ago and only 5 cc the day prior.  Patient was able to tolerate some potatoes and carrots last night without any emesis.  He had few bites of eggs this morning as mentioned above just had emesis.  He reports he is passing flatus with most recent episode this morning.  He had a loose BM yesterday and early this morning.  He reports he was not told of blood in his stool.  X-ray from 1/25 showed no signs of bowel obstruction. This mentions perc chole drain is in RLQ but actually appears in RUQ as expected on review.  WBC is downtrending at 13.7 today from 17.1.  LFTs not been rechecked since 1/24. Cultures from Perc chole drain with no growth  Objective: Vital signs in last 24 hours: Temp:  [97.3 F (36.3 C)-98.4 F (36.9 C)] 97.3 F (36.3 C) (01/27 0744) Pulse Rate:  [64-74] 74 (01/27 0744) Resp:  [11-20] 11 (01/27 0744) BP:  (106-141)/(51-64) 127/60 (01/27 0744) SpO2:  [98 %-100 %] 100 % (01/27 0744) Last BM Date: 03/27/21  Intake/Output from previous day: 01/26 0701 - 01/27 0700 In: 240 [P.O.:240] Out: 5 [Drains:5] Intake/Output this shift: No intake/output data recorded.  PE: Gen:  Alert, NAD, pleasant Pulm: Rate and effort normal Abd: Soft, mild upper abdominal distension. Some ttp to the LUQ and epigastrium. No RUQ tenderness or lower abdominal tenderness. No peritonitis. +BS. Perc chole drain with dark bloody output in bag Ext:  No LE edema or calf tenderness Psych: A&Ox3  Skin: no rashes noted, warm and dry  Lab Results:  Recent Labs    03/30/21 0241 03/31/21 0836  WBC 17.1* 13.7*  HGB 8.8* 9.5*  HCT 25.7* 28.6*  PLT 453* 506*   BMET Recent Labs    03/29/21 0346 03/30/21 0241  NA 128* 132*  K 4.2 4.1  CL 95* 95*  CO2 21* 23  GLUCOSE 84 76  BUN 43* 29*  CREATININE 7.04* 5.55*  CALCIUM 8.3* 9.1   PT/INR No results for input(s): LABPROT, INR in the last 72 hours. CMP     Component Value Date/Time   NA 132 (L) 03/30/2021 0241   NA 134 (A) 09/11/2019 0000   K 4.1 03/30/2021 0241   CL 95 (L) 03/30/2021 0241   CO2 23  03/30/2021 0241   GLUCOSE 76 03/30/2021 0241   BUN 29 (H) 03/30/2021 0241   BUN 34 (A) 09/11/2019 0000   CREATININE 5.55 (H) 03/30/2021 0241   CALCIUM 9.1 03/30/2021 0241   PROT 5.3 (L) 03/28/2021 0319   PROT 6.9 02/19/2018 1014   ALBUMIN 1.7 (L) 03/28/2021 0319   ALBUMIN 4.2 02/19/2018 1014   AST 122 (H) 03/28/2021 0319   ALT 8 03/28/2021 0319   ALKPHOS 409 (H) 03/28/2021 0319   BILITOT 0.9 03/28/2021 0319   BILITOT 0.5 02/19/2018 1014   GFRNONAA 10 (L) 03/30/2021 0241   GFRAA 7 (L) 12/03/2019 0710   Lipase     Component Value Date/Time   LIPASE 30 03/27/2021 1143    Studies/Results: DG Abd Portable 1V  Result Date: 03/29/2021 CLINICAL DATA:  Emesis for 2-3 days, history of cancer and diabetes in an 81 year old male. EXAM: PORTABLE ABDOMEN - 1  VIEW COMPARISON:  CT of March 23, 2021. FINDINGS: A percutaneous cholecystostomy tube is partially visualized projecting over the RIGHT lower quadrant. RIGHT and LEFT hemidiaphragmatic contours are excluded from view. Imaging excludes the upper abdomen on today's study. There is no sign of bowel obstruction. Small amount of stool and gas present in the rectum. Brachytherapy seeds are present in the prostate. On limited assessment there is no acute regional skeletal process. IMPRESSION: 1. No sign of bowel obstruction. Imaging excludes the upper abdomen on today's study. 2. Cholecystostomy tube partially visualized projecting over the RIGHT lower quadrant. Electronically Signed   By: Zetta Bills M.D.   On: 03/29/2021 14:47    Anti-infectives: Anti-infectives (From admission, onward)    Start     Dose/Rate Route Frequency Ordered Stop   03/30/21 1245  fluconazole (DIFLUCAN) tablet 100 mg        100 mg Oral Daily 03/30/21 1157 04/06/21 0959   03/28/21 1645  cefOXitin (MEFOXIN) 2 g in sodium chloride 0.9 % 100 mL IVPB       Note to Pharmacy: Please send to tube station 17 radiology- for procedure   2 g 200 mL/hr over 30 Minutes Intravenous  Once 03/28/21 1550 03/28/21 1720   03/27/21 1645  vancomycin (VANCOREADY) IVPB 1500 mg/300 mL        1,500 mg 150 mL/hr over 120 Minutes Intravenous  Once 03/27/21 1632 03/27/21 1942   03/27/21 1630  piperacillin-tazobactam (ZOSYN) IVPB 2.25 g        2.25 g 100 mL/hr over 30 Minutes Intravenous Every 8 hours 03/27/21 1617          Assessment/Plan  Acute Cholecystitis - H/o of perc chole 1/12, removed 1/19 after becoming dislodged unintentionally - HIDA +. S/p Perc chole drain 1/24. Cx's pending with currently NGTD - Recommend 10d total of abx from our standpoint. We have arranged follow up in the office.  -Called back for nausea.  Unclear the etiology of this.  Seems this occurs after any p.o. intake.  X-ray from 1/25 showed no signs of bowel  obstruction. WBC is downtrending at 13.7 today from 17.1.  LFTs not been rechecked since 1/24. Will recheck + Lipase. Cultures from Perc chole drain with no growth. Advised RN to flush per IR recs as noted in note on 1/25. Will get CT A/P to evaluate. Will also ask speech to see. Patient currently on [place] and feels like meds are getting stuck occasionally when trying to swallow them but was on D2 (fine chop) diet during last admission with recommendation for medications to be  crushed with puree. I do not see that they have seen him here.    FEN: NPO. SLP eval.  ID: vanc 1/23. Zosyn 1/23 >  VTE: Okay for chemical prophylaxis or anticoagulation from our standpoint. Anticoagulation d/c'd during prior admission 2/2 GI bleed   Per primary ESRD on dialysis Paroxysmal afib T2DM Parkinson's disease PVD s/p L BKA Hx UR w/ foley  Pleural effusions   This care required moderate level of medical decision making.    LOS: 3 days    Jillyn Ledger , [facility] 03/31/2021, 9:12 AM Please see Amion for pager number during day hours 7:00am-4:30pm

## 2021-03-31 NOTE — Progress Notes (Addendum)
[name] Progress Note   Subjective: Seen on HD. BP soft, out of UF. Decreased UFG to net 2 liters. Says he feels better.   Patient is blind.   Objective Vitals:   03/31/21 1030 03/31/21 1050 03/31/21 1100 03/31/21 1130  BP:  (!) 137/39 (!) 132/39 (!) 139/49  Pulse:  60 81   Resp: 11 16 (!) 22 19  Temp:      TempSrc:      SpO2:      Weight:      Height:       Physical Exam General: Very elderly chronically ill appearing male in NAD Heart: Normal S1 and S2; No murmurs, gallops, or rubs Lungs: Clear anteriorly and laterally Abdomen: NABS, Soft and non-tender, R percutaneous cholecystostomy  drain in place Extremities: L BKA no stump edema, no RLE edema Dialysis Access: L AVF +T/B   Additional Objective Labs: Basic Metabolic Panel: Recent Labs  Lab 03/28/21 0319 03/29/21 0346 03/30/21 0241  NA 128* 128* 132*  K 3.9 4.2 4.1  CL 94* 95* 95*  CO2 25 21* 23  GLUCOSE 92 84 76  BUN 37* 43* 29*  CREATININE 6.13* 7.04* 5.55*  CALCIUM 8.2* 8.3* 9.1  PHOS 4.9*  --   --    Liver Function Tests: Recent Labs  Lab 03/26/21 0346 03/27/21 1143 03/28/21 0319  AST 18 35 122*  ALT 6 6 8   ALKPHOS 135* 191* 409*  BILITOT 0.7 1.0 0.9  PROT 5.3* 6.8 5.3*  ALBUMIN 1.7* 2.4* 1.7*   Recent Labs  Lab 03/27/21 1143  LIPASE 30   CBC: Recent Labs  Lab 03/27/21 1143 03/28/21 0319 03/29/21 0346 03/30/21 0241 03/31/21 0836  WBC 15.7* 13.7* 14.4* 17.1* 13.7*  NEUTROABS  --   --  12.0* 14.7* 11.0*  HGB 11.4* 8.2* 8.4* 8.8* 9.5*  HCT 33.7* 24.7* 25.1* 25.7* 28.6*  MCV 103.1* 101.6* 100.8* 101.6* 101.1*  PLT 490* 438* 427* 453* 506*   Blood Culture    Component Value Date/Time   SDES ABSCESS 03/28/2021 1710   SPECREQUEST GALLBLADDER 03/28/2021 1710   CULT  03/28/2021 1710    NO GROWTH 3 DAYS NO ANAEROBES ISOLATED; CULTURE IN PROGRESS FOR 5 DAYS Performed at [facility], [address] [place], [place] [address]    REPTSTATUS PENDING  03/28/2021 1710    Cardiac Enzymes: No results for input(s): CKTOTAL, CKMB, CKMBINDEX, TROPONINI in the last 168 hours. CBG: Recent Labs  Lab 03/30/21 1626 03/30/21 1954 03/30/21 2321 03/31/21 0342 03/31/21 0743  GLUCAP 116* 94 89 66* 112*   Iron Studies: No results for input(s): IRON, TIBC, TRANSFERRIN, FERRITIN in the last 72 hours. @lablastinr3 @ Studies/Results: DG Abd Portable 1V  Result Date: 03/31/2021 CLINICAL DATA:  Emesis EXAM: PORTABLE ABDOMEN - 1 VIEW COMPARISON:  Abdominal radiograph dated March 29, 2021 FINDINGS: Nonobstructive bowel gas pattern. Surgical drain noted in the right upper quadrant. Partially visualized left lung bases clear. IMPRESSION: Nonobstructive bowel gas pattern. Electronically Signed   By: Yetta Glassman M.D.   On: 03/31/2021 10:49   DG Abd Portable 1V  Result Date: 03/29/2021 CLINICAL DATA:  Emesis for 2-3 days, history of cancer and diabetes in an 81 year old male. EXAM: PORTABLE ABDOMEN - 1 VIEW COMPARISON:  CT of March 23, 2021. FINDINGS: A percutaneous cholecystostomy tube is partially visualized projecting over the RIGHT lower quadrant. RIGHT and LEFT hemidiaphragmatic contours are excluded from view. Imaging excludes the upper abdomen on today's study. There is no sign of  bowel obstruction. Small amount of stool and gas present in the rectum. Brachytherapy seeds are present in the prostate. On limited assessment there is no acute regional skeletal process. IMPRESSION: 1. No sign of bowel obstruction. Imaging excludes the upper abdomen on today's study. 2. Cholecystostomy tube partially visualized projecting over the RIGHT lower quadrant. Electronically Signed   By: Zetta Bills M.D.   On: 03/29/2021 14:47   Medications:  chlorproMAZINE (THORAZINE) IV 12.5 mg (03/30/21 2320)   fluconazole (DIFLUCAN) IV     piperacillin-tazobactam (ZOSYN)  IV 2.25 g (03/30/21 2140)    brimonidine  1 drop Right Eye TID   carbidopa-levodopa  1 tablet Oral  TID   Chlorhexidine Gluconate Cloth  6 each Topical Daily   Chlorhexidine Gluconate Cloth  6 each Topical Q0600   cinacalcet  60 mg Oral Q M,W,F-HD   darbepoetin (ARANESP) injection - DIALYSIS  40 mcg Intravenous Q Wed-HD   dorzolamide-timolol  1 drop Right Eye BID   doxercalciferol  3 mcg Intravenous Q M,W,F-HD   gabapentin  100 mg Oral Daily   heparin injection (subcutaneous)  5,000 Units Subcutaneous Q8H   insulin aspart  0-6 Units Subcutaneous Q4H   metoprolol tartrate  12.5 mg Oral BID   Netarsudil-Latanoprost  1 drop Right Eye QHS   pantoprazole  40 mg Oral Daily   pilocarpine  1 drop Right Eye QID   polyethylene glycol  17 g Oral Daily   senna  1 tablet Oral BID   sodium chloride flush  5 mL Intracatheter Q8H     Dialysis Orders: [place] MWF  4h  400/600  66kg 2/2 bath  P2  AVF  Hep none  - sensipar 60 mg po tiw  - hectorol 3 ug tiw IV  - mircera 75 ug q 2 wks, just ordered   Assessment/Plan: Abdominal pain: recent admit for acute cholecystitis, S/P perc chole tube 1/12- 1/17 per IR, dc'd on 1/22. Readmitted now w/ abd pain again and is SP new perc chole tube by IR 1/24. Per pmd / IR. Getting IV abx.  Atrial fib - recent onset, per pmd  ESRD - on HD MWF. Next HD 04/03/2021  BP/volume  - BP's up, was down to 62 kg during recent admit. Down to 68kg post HD 1/25. Cont to lower vol as tolerated.  Anemia ckd  - Hb 9.5, started ESA last admit darbe 40 weekly, will continue.  Metabolic bone disease -  changed auryxia to Renvela binders 2 ac last admit, phos 4.9. Continue sensipar and vdra. Ca in range.  Nutrition - Albumin <2. Protein supps ordered. Renal diet.  Parkinson's disease-per primary DMT2-per primary H/O TAVR DNR  Rita H. Brown NP-C 03/31/2021, 12:01 PM  [facility] [phone]  Pt seen, examined and agree w A/P as above.  Kelly Splinter  MD 03/31/2021, 3:42 PM

## 2021-03-31 NOTE — Progress Notes (Signed)
Notified by trech he refused CBG and VS at this time

## 2021-03-31 NOTE — TOC Progression Note (Signed)
Transition of Care [facility]) - Progression Note    Patient Details  Name: Elijah White MRN: [id number] Date of Birth: [birth year]  Transition of [facility] CM/SW [place], [place] Phone Number: 03/31/2021, 10:24 AM  Clinical Narrative:    CSW confirmed with MD that patient is not medically ready for DC today. CSW followed up with facility and they will be able to accept pt Monday, they are not doing weekend admissions. MD is agreeable to Monday DC. Covid test will need to be ordered and auth can be started on Sunday. DNR is signed and on the chart. TOC will continue to follow for DC needs.   Expected Discharge Plan: Long Term Nursing Home Barriers to Discharge: Continued Medical Work up  Expected Discharge Plan and Services Expected Discharge Plan: [place] Acute Care Choice: [place] Living arrangements for the past 2 months: [place]                                       Social Determinants of Health (SDOH) Interventions    Readmission Risk Interventions Readmission Risk Prevention Plan 03/17/2021  Transportation Screening Complete  HRI or Home Care Consult Complete  SW Recovery Care/Counseling Consult Complete  Skilled Nursing Facility Complete  Some recent data might be hidden

## 2021-03-31 NOTE — Progress Notes (Signed)
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  She was recently admitted from 1/11-03/2020 for acute cholecystitis managed with percutaneous cholecystectomy tube which was unintentionally dislodged and subsequently removed on 1/19 and was discharged with oral antibiotics.  After being discharged to nursing facility, he developed severe abdominal pain with associated nausea and vomiting.  On presentation ,he was afebrile, lab work showed worsening leukocytosis.  Right upper quadrant ultrasound showed distended gallbladder with cholelithiasis, sludge, pericholecystic fluid.  Murphy sign was positive.  IR, general surgery consulted.  Underwent cholecystostomy tube placement by IR on 1/24.  Cultures have not shown any growth.  Hospital course remarkable for persistent nausea, vomiting. General surgery reconsulted today  Assessment & Plan:   Principal Problem:   Acute cholecystitis Active Problems:   Type 2 diabetes mellitus with renal manifestations (HCC)   Essential hypertension   HLD (hyperlipidemia)   ESRD (end stage renal disease) (HCC)   Macrocytic anemia   RBBB   Blindness   S/P TAVR (transcatheter aortic valve replacement)   Anemia in chronic kidney disease   Secondary hyperparathyroidism of renal origin ([place])   PAD (peripheral artery disease) ([place])   DNR (do not resuscitate)   Pressure injury of skin   Acute cholecystitis:He was recently admitted from 1/11-03/2020 for acute cholecystitis managed with percutaneous cholecystectomy tube which  was unintentionally dislodged and subsequently removed on 1/19 and was discharged with oral antibiotics.  After being discharged to nursing facility, he developed severe abdominal pain with associated nausea and vomiting.  On presentation she was afebrile, lab work showed worsening leukocytosis.  Right upper quadrant ultrasound showed distended gallbladder with cholelithiasis, sludge, pericholecystic fluid.  Murphy sign was positive.  IR, general surgery consulted.  Underwent cholecystostomy tube placement by IR on 1/24.   Continue pain management supportive care.  Blood cultures no growth till date.  Aerobic/anaerobic culture have not shown any growth, no organisms.continue current antibiotics.  Leukocytosis improved.  Plan for total of 10 days duration of antibiotics. Patient has been having consistent nausea/vomiting since last 2 days.  General surgery consulted today.  Keeping him n.p.o.  X-ray of the abdomen done this morning did not show any bowel obstruction.  We will follow-up CT abdomen/pelvis.  Pending lipase  ESRD: On dialysis on MWF.  Nephrology following for dialysis  Bilateral pleural effusion: Moderate to large RIGHT and small LEFT pleural effusions with associated RIGHT basilar atelectasis as per CXR.  This finding was also present on last admission.  No respiratory symptoms.  Currently on room air.   Volume management with dialysis  Paroxysmal A. fib: Currently in normal sinus rhythm.  Not on anticoagulation due to concern for GI bleed.  On low-dose metoprolol for rate control.  Monitor on telemetry  Non-insulin-dependent diabetes type 2: Recent A1c of 7%.  Monitor blood sugars.  Currently on sliding scale insulin.  On gabapentin for diabetic neuropathy.  History of glaucoma: Continue eyedrops  Parkinson's disease: Continue Sinemet.  Delirium precautions  History of peripheral vascular disease : status post left BKA.  Takes Plavix , continued  History of urinary retention: On last  admission Foley was placed and he was discharged with Foley.  Continue Foley catheter for now.  We recommend to follow-up with urology as an outpatient  Macrocytic anemia/esophageal candidiasis: Associated with ESRD.  No evidence of bleeding.  Hemoglobin stable.  He underwent EGD on last admission.  Biopsies showed candidiasis.  He started on 7 days course of fluconazole  Hyponatremia: Likely from volume overload.  Continue monitoring.  Volume management as per dialysis          DVT prophylaxis:Heparin [place] Code Status: DNR Family Communication:: Discussed with daughter on phone on 1/26 Patient status: Inpatient  Dispo: The patient is from: Skilled nursing facility              Anticipated d/c is to: Skilled nursing facility              Anticipated d/c date is: Likely early next week Consultants: IR, general surgery, nephrology  Procedures: None  Antimicrobials:  Anti-infectives (From admission, onward)    Start     Dose/Rate Route Frequency Ordered Stop   03/30/21 1245  fluconazole (DIFLUCAN) tablet 100 mg        100 mg Oral Daily 03/30/21 1157 04/06/21 0959   03/28/21 1645  cefOXitin (MEFOXIN) 2 g in sodium chloride 0.9 % 100 mL IVPB       Note to Pharmacy: Please send to tube station 17 radiology- for procedure   2 g 200 mL/hr over 30 Minutes Intravenous  Once 03/28/21 1550 03/28/21 1720   03/27/21 1645  vancomycin (VANCOREADY) IVPB 1500 mg/300 mL        1,500 mg 150 mL/hr over 120 Minutes Intravenous  Once 03/27/21 1632 03/27/21 1942   03/27/21 1630  piperacillin-tazobactam (ZOSYN) IVPB 2.25 g        2.25 g 100 mL/hr over 30 Minutes Intravenous Every 8 hours 03/27/21 1617         Subjective: Patient seen and examined at the bedside this morning.  He is still bothered by continuous nausea.  Intermittent vomiting.  Denies any worsening of abdominal during today.  He said he had a bowel movement this morning.  Bowel sounds are good.  Right-sided abdominal drain draining  sanguinous fluid  Objective: Vitals:   03/30/21 1600 03/30/21 1954 03/31/21 0344 03/31/21 0744  BP: (!) 141/64 112/60 (!) 106/51 127/60  Pulse: 70 64 64 74  Resp: 20 14 16 11   Temp: 98 F (36.7 C) 98.4 F (36.9 C)  (!) 97.3 F (36.3 C)  TempSrc: Oral Axillary  Axillary  SpO2: 99% 98% 100% 100%  Weight:      Height:        Intake/Output Summary (Last 24 hours) at 03/31/2021 1109 Last data filed at 03/30/2021 1400 Gross per 24 hour  Intake 120 ml  Output --  Net 120 ml   Filed Weights   03/28/21 1748 03/29/21 0739 03/29/21 1109  Weight: 74.4 kg 70.3 kg 68.2 kg    Examination:   General exam: Very deconditioned, chronically ill looking, HEENT: Legally blind Respiratory system:  no wheezes or crackles  Cardiovascular system: S1 & S2 heard, RRR.  Gastrointestinal system: Abdomen is nondistended, soft and nontender.  Right upper quadrant drain with sanguinous output Central nervous system: Alert and oriented Extremities: No edema, no clubbing ,no cyanosis, left BKA, AV fistula on the left upper extremity Skin: No rashes, no ulcers,no icterus    Data Reviewed: I have personally reviewed following labs and imaging studies  CBC: Recent Labs  Lab  03/27/21 1143 03/28/21 0319 03/29/21 0346 03/30/21 0241 03/31/21 0836  WBC 15.7* 13.7* 14.4* 17.1* 13.7*  NEUTROABS  --   --  12.0* 14.7* 11.0*  HGB 11.4* 8.2* 8.4* 8.8* 9.5*  HCT 33.7* 24.7* 25.1* 25.7* 28.6*  MCV 103.1* 101.6* 100.8* 101.6* 101.1*  PLT 490* 438* 427* 453* 696*   Basic Metabolic Panel: Recent Labs  Lab 03/26/21 0346 03/27/21 1143 03/28/21 0319 03/29/21 0346 03/30/21 0241  NA 132* 131* 128* 128* 132*  K 4.0 4.6 3.9 4.2 4.1  CL 96* 93* 94* 95* 95*  CO2 26 22 25  21* 23  GLUCOSE 104* 123* 92 84 76  BUN 25* 35* 37* 43* 29*  CREATININE 4.18* 5.60* 6.13* 7.04* 5.55*  CALCIUM 8.2* 8.8* 8.2* 8.3* 9.1  PHOS  --   --  4.9*  --   --    GFR: Estimated Creatinine Clearance: 8.9 mL/min (A) (by C-G formula  based on SCr of 5.55 mg/dL (H)). Liver Function Tests: Recent Labs  Lab 03/26/21 0346 03/27/21 1143 03/28/21 0319  AST 18 35 122*  ALT 6 6 8   ALKPHOS 135* 191* 409*  BILITOT 0.7 1.0 0.9  PROT 5.3* 6.8 5.3*  ALBUMIN 1.7* 2.4* 1.7*   Recent Labs  Lab 03/27/21 1143  LIPASE 30   No results for input(s): AMMONIA in the last 168 hours. Coagulation Profile: Recent Labs  Lab 03/28/21 0319  INR 1.2   Cardiac Enzymes: No results for input(s): CKTOTAL, CKMB, CKMBINDEX, TROPONINI in the last 168 hours. BNP (last 3 results) No results for input(s): PROBNP in the last 8760 hours. HbA1C: No results for input(s): HGBA1C in the last 72 hours. CBG: Recent Labs  Lab 03/30/21 1626 03/30/21 1954 03/30/21 2321 03/31/21 0342 03/31/21 0743  GLUCAP 116* 94 89 66* 112*   Lipid Profile: No results for input(s): CHOL, HDL, LDLCALC, TRIG, CHOLHDL, LDLDIRECT in the last 72 hours. Thyroid Function Tests: No results for input(s): TSH, T4TOTAL, FREET4, T3FREE, THYROIDAB in the last 72 hours. Anemia Panel: No results for input(s): VITAMINB12, FOLATE, FERRITIN, TIBC, IRON, RETICCTPCT in the last 72 hours. Sepsis Labs: No results for input(s): PROCALCITON, LATICACIDVEN in the last 168 hours.  Recent Results (from the past 240 hour(s))  Resp Panel by RT-PCR (Flu A&B, Covid) Nasopharyngeal Swab     Status: None   Collection Time: 03/22/21 12:42 PM   Specimen: Nasopharyngeal Swab; Nasopharyngeal(NP) swabs in vial transport medium  Result Value Ref Range Status   SARS Coronavirus 2 by RT PCR NEGATIVE NEGATIVE Final    Comment: (NOTE) SARS-CoV-2 target nucleic acids are NOT DETECTED.  The SARS-CoV-2 RNA is generally detectable in upper respiratory specimens during the acute phase of infection. The lowest concentration of SARS-CoV-2 viral copies this assay can detect is 138 copies/mL. A negative result does not preclude SARS-Cov-2 infection and should not be used as the sole basis for treatment  or other patient management decisions. A negative result may occur with  improper specimen collection/handling, submission of specimen other than nasopharyngeal swab, presence of viral mutation(s) within the areas targeted by this assay, and inadequate number of viral copies(<138 copies/mL). A negative result must be combined with clinical observations, patient history, and epidemiological information. The expected result is Negative.  Fact Sheet for Patients:  EntrepreneurPulse.com.au  Fact Sheet for Healthcare Providers:  IncredibleEmployment.be  This test is no t yet approved or cleared by the [place] FDA and  has been authorized for detection and/or diagnosis of SARS-CoV-2 by FDA under an Emergency  Use Authorization (EUA). This EUA will remain  in effect (meaning this test can be used) for the duration of the COVID-19 declaration under Section 564(b)(1) of the Act, 21 U.S.C.section 360bbb-3(b)(1), unless the authorization is terminated  or revoked sooner.       Influenza A by PCR NEGATIVE NEGATIVE Final   Influenza B by PCR NEGATIVE NEGATIVE Final    Comment: (NOTE) The Xpert Xpress SARS-CoV-2/FLU/RSV plus assay is intended as an aid in the diagnosis of influenza from Nasopharyngeal swab specimens and should not be used as a sole basis for treatment. Nasal washings and aspirates are unacceptable for Xpert Xpress SARS-CoV-2/FLU/RSV testing.  Fact Sheet for Patients: EntrepreneurPulse.com.au  Fact Sheet for Healthcare Providers: IncredibleEmployment.be  This test is not yet approved or cleared by the [place] FDA and has been authorized for detection and/or diagnosis of SARS-CoV-2 by FDA under an Emergency Use Authorization (EUA). This EUA will remain in effect (meaning this test can be used) for the duration of the COVID-19 declaration under Section 564(b)(1) of the Act, 21 U.S.C. section  360bbb-3(b)(1), unless the authorization is terminated or revoked.  Performed at [facility], [place] [address] [place], [place] [address]   Resp Panel by RT-PCR (Flu A&B, Covid) Nasopharyngeal Swab     Status: None   Collection Time: 03/26/21 11:56 AM   Specimen: Nasopharyngeal Swab; Nasopharyngeal(NP) swabs in vial transport medium  Result Value Ref Range Status   SARS Coronavirus 2 by RT PCR NEGATIVE NEGATIVE Final    Comment: (NOTE) SARS-CoV-2 target nucleic acids are NOT DETECTED.  The SARS-CoV-2 RNA is generally detectable in upper respiratory specimens during the acute phase of infection. The lowest concentration of SARS-CoV-2 viral copies this assay can detect is 138 copies/mL. A negative result does not preclude SARS-Cov-2 infection and should not be used as the sole basis for treatment or other patient management decisions. A negative result may occur with  improper specimen collection/handling, submission of specimen other than nasopharyngeal swab, presence of viral mutation(s) within the areas targeted by this assay, and inadequate number of viral copies(<138 copies/mL). A negative result must be combined with clinical observations, patient history, and epidemiological information. The expected result is Negative.  Fact Sheet for Patients:  EntrepreneurPulse.com.au  Fact Sheet for Healthcare Providers:  IncredibleEmployment.be  This test is no t yet approved or cleared by the [place] FDA and  has been authorized for detection and/or diagnosis of SARS-CoV-2 by FDA under an Emergency Use Authorization (EUA). This EUA will remain  in effect (meaning this test can be used) for the duration of the COVID-19 declaration under Section 564(b)(1) of the Act, 21 U.S.C.section 360bbb-3(b)(1), unless the authorization is terminated  or revoked sooner.       Influenza A by PCR NEGATIVE NEGATIVE Final   Influenza B by PCR NEGATIVE  NEGATIVE Final    Comment: (NOTE) The Xpert Xpress SARS-CoV-2/FLU/RSV plus assay is intended as an aid in the diagnosis of influenza from Nasopharyngeal swab specimens and should not be used as a sole basis for treatment. Nasal washings and aspirates are unacceptable for Xpert Xpress SARS-CoV-2/FLU/RSV testing.  Fact Sheet for Patients: EntrepreneurPulse.com.au  Fact Sheet for Healthcare Providers: IncredibleEmployment.be  This test is not yet approved or cleared by the [place] FDA and has been authorized for detection and/or diagnosis of SARS-CoV-2 by FDA under an Emergency Use Authorization (EUA). This EUA will remain in effect (meaning this test can be used) for the duration of the COVID-19 declaration  under Section 564(b)(1) of the Act, 21 U.S.C. section 360bbb-3(b)(1), unless the authorization is terminated or revoked.  Performed at [facility], [place] [address] [place], [place] [address]   Resp Panel by RT-PCR (Flu A&B, Covid) Nasopharyngeal Swab     Status: None   Collection Time: 03/27/21  2:10 PM   Specimen: Nasopharyngeal Swab; Nasopharyngeal(NP) swabs in vial transport medium  Result Value Ref Range Status   SARS Coronavirus 2 by RT PCR NEGATIVE NEGATIVE Final    Comment: (NOTE) SARS-CoV-2 target nucleic acids are NOT DETECTED.  The SARS-CoV-2 RNA is generally detectable in upper respiratory specimens during the acute phase of infection. The lowest concentration of SARS-CoV-2 viral copies this assay can detect is 138 copies/mL. A negative result does not preclude SARS-Cov-2 infection and should not be used as the sole basis for treatment or other patient management decisions. A negative result may occur with  improper specimen collection/handling, submission of specimen other than nasopharyngeal swab, presence of viral mutation(s) within the areas targeted by this assay, and inadequate number of viral copies(<138  copies/mL). A negative result must be combined with clinical observations, patient history, and epidemiological information. The expected result is Negative.  Fact Sheet for Patients:  EntrepreneurPulse.com.au  Fact Sheet for Healthcare Providers:  IncredibleEmployment.be  This test is no t yet approved or cleared by the [place] FDA and  has been authorized for detection and/or diagnosis of SARS-CoV-2 by FDA under an Emergency Use Authorization (EUA). This EUA will remain  in effect (meaning this test can be used) for the duration of the COVID-19 declaration under Section 564(b)(1) of the Act, 21 U.S.C.section 360bbb-3(b)(1), unless the authorization is terminated  or revoked sooner.       Influenza A by PCR NEGATIVE NEGATIVE Final   Influenza B by PCR NEGATIVE NEGATIVE Final    Comment: (NOTE) The Xpert Xpress SARS-CoV-2/FLU/RSV plus assay is intended as an aid in the diagnosis of influenza from Nasopharyngeal swab specimens and should not be used as a sole basis for treatment. Nasal washings and aspirates are unacceptable for Xpert Xpress SARS-CoV-2/FLU/RSV testing.  Fact Sheet for Patients: EntrepreneurPulse.com.au  Fact Sheet for Healthcare Providers: IncredibleEmployment.be  This test is not yet approved or cleared by the [place] FDA and has been authorized for detection and/or diagnosis of SARS-CoV-2 by FDA under an Emergency Use Authorization (EUA). This EUA will remain in effect (meaning this test can be used) for the duration of the COVID-19 declaration under Section 564(b)(1) of the Act, 21 U.S.C. section 360bbb-3(b)(1), unless the authorization is terminated or revoked.  Performed at [facility], [place] [address], [place], [place] [address]   Culture, blood (routine x 2)     Status: None (Preliminary result)   Collection Time: 03/27/21  4:03 PM   Specimen: BLOOD  Result  Value Ref Range Status   Specimen Description BLOOD BLOOD RIGHT ARM  Final   Special Requests   Final    BOTTLES DRAWN AEROBIC AND ANAEROBIC Blood Culture results may not be optimal due to an excessive volume of blood received in culture bottles   Culture   Final    NO GROWTH 4 DAYS Performed at [facility], [place] [address], [place], [place] [address]    Report Status PENDING  Incomplete  Culture, blood (routine x 2)     Status: None (Preliminary result)   Collection Time: 03/27/21  6:49 PM   Specimen: BLOOD RIGHT HAND  Result Value Ref  Range Status   Specimen Description BLOOD RIGHT HAND  Final   Special Requests   Final    BOTTLES DRAWN AEROBIC AND ANAEROBIC Blood Culture adequate volume   Culture   Final    NO GROWTH 4 DAYS Performed at [facility], [address], [place], [place] [address]    Report Status PENDING  Incomplete  Aerobic/Anaerobic Culture w Gram Stain (surgical/deep wound)     Status: None (Preliminary result)   Collection Time: 03/28/21  5:10 PM   Specimen: Abscess  Result Value Ref Range Status   Specimen Description ABSCESS  Final   Special Requests GALLBLADDER  Final   Gram Stain   Final    FEW WBC PRESENT,BOTH PMN AND MONONUCLEAR NO ORGANISMS SEEN    Culture   Final    NO GROWTH 3 DAYS NO ANAEROBES ISOLATED; CULTURE IN PROGRESS FOR 5 DAYS Performed at [facility], [place] [address], [place], [place] [address]    Report Status PENDING  Incomplete         Radiology Studies: DG Abd Portable 1V  Result Date: 03/31/2021 CLINICAL DATA:  Emesis EXAM: PORTABLE ABDOMEN - 1 VIEW COMPARISON:  Abdominal radiograph dated March 29, 2021 FINDINGS: Nonobstructive bowel gas pattern. Surgical drain noted in the right upper quadrant. Partially visualized left lung bases clear. IMPRESSION: Nonobstructive bowel gas pattern. Electronically Signed   By: Yetta Glassman M.D.   On: 03/31/2021 10:49   DG Abd Portable 1V  Result Date:  03/29/2021 CLINICAL DATA:  Emesis for 2-3 days, history of cancer and diabetes in an 80 year old male. EXAM: PORTABLE ABDOMEN - 1 VIEW COMPARISON:  CT of March 23, 2021. FINDINGS: A percutaneous cholecystostomy tube is partially visualized projecting over the RIGHT lower quadrant. RIGHT and LEFT hemidiaphragmatic contours are excluded from view. Imaging excludes the upper abdomen on today's study. There is no sign of bowel obstruction. Small amount of stool and gas present in the rectum. Brachytherapy seeds are present in the prostate. On limited assessment there is no acute regional skeletal process. IMPRESSION: 1. No sign of bowel obstruction. Imaging excludes the upper abdomen on today's study. 2. Cholecystostomy tube partially visualized projecting over the RIGHT lower quadrant. Electronically Signed   By: Zetta Bills M.D.   On: 03/29/2021 14:47        Scheduled Meds:  brimonidine  1 drop Right Eye TID   carbidopa-levodopa  1 tablet Oral TID   Chlorhexidine Gluconate Cloth  6 each Topical Daily   Chlorhexidine Gluconate Cloth  6 each Topical Q0600   cinacalcet  60 mg Oral Q M,W,F-HD   darbepoetin (ARANESP) injection - DIALYSIS  40 mcg Intravenous Q Wed-HD   dorzolamide-timolol  1 drop Right Eye BID   doxercalciferol  3 mcg Intravenous Q M,W,F-HD   fluconazole  100 mg Oral Daily   gabapentin  100 mg Oral Daily   heparin injection (subcutaneous)  5,000 Units Subcutaneous Q8H   insulin aspart  0-6 Units Subcutaneous Q4H   metoprolol tartrate  12.5 mg Oral BID   Netarsudil-Latanoprost  1 drop Right Eye QHS   pantoprazole  40 mg Oral Daily   pilocarpine  1 drop Right Eye QID   polyethylene glycol  17 g Oral Daily   senna  1 tablet Oral BID   sodium chloride flush  5 mL Intracatheter Q8H   Continuous Infusions:  chlorproMAZINE (THORAZINE) IV 12.5 mg (03/30/21 2320)   piperacillin-tazobactam (ZOSYN)  IV 2.25 g (03/30/21 2140)  LOS: 3 days       Shelly Coss, MD [facility] P1/27/2023, 11:09 AM

## 2021-03-31 NOTE — Evaluation (Signed)
Clinical/Bedside Swallow Evaluation Patient Details  Name: Elijah White MRN: [id number] Date of Birth: 1940-06-27  Today's Date: 03/31/2021 Time: SLP Start Time (ACUTE ONLY): 6010 SLP Stop Time (ACUTE ONLY): 1400 SLP Time Calculation (min) (ACUTE ONLY): 25 min  Past Medical History:  Past Medical History:  Diagnosis Date   Anemia    low iron   Aortic stenosis    s/p TAVR 01/19/20   Arthritis    Cancer ([place])    prostate, s/p I-125 seed implant 05/18/05   Colon polyps ~ 1993 and 2003   Dr [name], [facility].  08/2001 colonoscopy: tubular adenoma at cecum.     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   Elevated cholesterol with high triglycerides    ESRD (end stage renal disease) (HCC)    [facility]   GERD (gastroesophageal reflux disease)    Glaucoma    Hypertension    Legally blind in left eye, as defined in [place]    has pinpoint vision in right eye   PAD (peripheral artery disease) ([place])    RBBB    Past Surgical History:  Past Surgical History:  Procedure Laterality Date   ABDOMINAL AORTOGRAM W/LOWER EXTREMITY N/A 07/25/2020   Procedure: ABDOMINAL AORTOGRAM W/LOWER EXTREMITY;  Surgeon: Marty Heck, MD;  Location: [place] [facility];  Service: Cardiovascular;  Laterality: N/A;   AMPUTATION Left 08/12/2020   Procedure: LEFT BELOW KNEE AMPUTATION;  Surgeon: Marty Heck, MD;  Location: [place];  Service: Vascular;  Laterality: Left;   AV FISTULA PLACEMENT Left 03/20/2018   Procedure: ARTERIOVENOUS (AV) FISTULA CREATION ARM;  Surgeon: [name], MD;  Location: [place];  Service: Vascular;  Laterality: Left;   [place] Left 05/21/2018   Procedure: LEFT BASILIC VEIN FISTULA SECOND STAGE;  Surgeon: [name], MD;  Location: [place];  Service: Vascular;  Laterality: Left;   BIOPSY  03/25/2021   Procedure: BIOPSY;  Surgeon: Otis Brace, MD;  Location: [facility];  Service:  Gastroenterology;;   COLONOSCOPY     COLONOSCOPY WITH PROPOFOL N/A 11/30/2019   Procedure: COLONOSCOPY WITH PROPOFOL;  Surgeon: Ronnette Juniper, MD;  Location: [place];  Service: Gastroenterology;  Laterality: N/A;   ESOPHAGOGASTRODUODENOSCOPY (EGD) WITH PROPOFOL N/A 03/25/2021   Procedure: ESOPHAGOGASTRODUODENOSCOPY (EGD) WITH PROPOFOL;  Surgeon: Otis Brace, MD;  Location: [place];  Service: Gastroenterology;  Laterality: N/A;   GLAUCOMA SURGERY  2019   multiple surgeries   HEMOSTASIS CLIP PLACEMENT  11/30/2019   Procedure: HEMOSTASIS CLIP PLACEMENT;  Surgeon: Ronnette Juniper, MD;  Location: [place];  Service: Gastroenterology;;   IR CHOLANGIOGRAM EXISTING TUBE  03/21/2021   IR PERC CHOLECYSTOSTOMY  03/16/2021   IR PERC CHOLECYSTOSTOMY  03/28/2021   IR REMOVAL BILIARY DRAIN  03/21/2021   KNEE ARTHROSCOPY     MULTIPLE EXTRACTIONS WITH ALVEOLOPLASTY N/A 12/30/2019   Procedure: MULTIPLE EXTRACTION WITH ALVEOLOPLASTY;  Surgeon: Charlaine Dalton, DMD;  Location: [place];  Service: Dentistry;  Laterality: N/A;   PERIPHERAL VASCULAR INTERVENTION Left 07/25/2020   Procedure: PERIPHERAL VASCULAR INTERVENTION;  Surgeon: Marty Heck, MD;  Location: [place] [facility];  Service: Cardiovascular;  Laterality: Left;  superficial femoral   POLYPECTOMY  11/30/2019   Procedure: POLYPECTOMY;  Surgeon: Ronnette Juniper, MD;  Location: [place];  Service: Gastroenterology;;   PROSTATE SURGERY     RIGHT/LEFT HEART CATH AND CORONARY ANGIOGRAPHY N/A 12/03/2019   Procedure: RIGHT/LEFT HEART CATH AND CORONARY ANGIOGRAPHY;  Surgeon: Burnell Blanks, MD;  Location: [place] [facility];  Service: Cardiovascular;  Laterality: N/A;   SUBMUCOSAL TATTOO INJECTION  11/30/2019   Procedure: SUBMUCOSAL TATTOO INJECTION;  Surgeon: Ronnette Juniper, MD;  Location: [place];  Service: Gastroenterology;;   TEE WITHOUT CARDIOVERSION N/A 01/19/2020   Procedure: TRANSESOPHAGEAL ECHOCARDIOGRAM (TEE);  Surgeon:  Burnell Blanks, MD;  Location: [place] [facility];  Service: Open Heart Surgery;  Laterality: N/A;   TRANSCATHETER AORTIC VALVE REPLACEMENT, TRANSFEMORAL Left 01/19/2020   Procedure: TRANSCATHETER AORTIC VALVE REPLACEMENT, LEFT TRANSFEMORAL;  Surgeon: Burnell Blanks, MD;  Location: [place] [facility];  Service: Open Heart Surgery;  Laterality: Left;   HPI:  Elijah White is an 81 y.o. male with medical history significant for ESRD on HD MWF, aortic stenosis status post TAVR 2021, nonobstructive CAD, Parkinson's disease, PVD on Plavix, s/p left BKA, HTN, IIDM, HLD, admitted from [facility] on 1/[facility].  He was admitted from 1/11-03/2020 for acute cholecystitis managed with percutaneous cholecystectomy tube which was unintentionally dislodged and subsequently removed on 1/19 and was discharged with oral antibiotics.  He returns with ongoing acute cholecystitis, underwent cholecystostomy tube placement by IR on 1/24. Reports ongoing nausea.  Consult for SLP swallow eval received 1/27.  Pt was followed by SLP during recent admission and was D/Cd on a dysphagia 2/chopped diet and thin liquids.  Modified diet was related to his mentation, missing teeth, and inconsistent communication from patient about what he was able and/or willing to chew.    Assessment / Plan / Recommendation  Clinical Impression  Pt was seen for a clinical swallowing assessment while he was in HD room after just finishing treatment.  He was reluctant to participate and required encouragement.  This reaction is consistent with behaviors during recent admission, when he required a lot of coaxing to try various food consistencies in order to advance his diet. Today, he demonstrated adequate mastication despite missing teeth, no s/s of aspiration, no s/s of esophageal deficits.  He reported frequent N/V in general but not specifically during evaluation. Recommend resuming a PO diet - he was not happy with purees or minced, so  dysphagia 3/mechanical soft may be preferred and easier to chew than regular solids. Crush meds given his reports of occasional lodging. There are no other needs identified from an oropharyngeal perspective.  D/W RN. Our service will sign off. SLP Visit Diagnosis: Dysphagia, unspecified (R13.10)    Aspiration Risk    unknown   Diet Recommendation   Dysphagia 3/ thin liquids  Medication Administration: Crushed with puree    Other  Recommendations Oral Care Recommendations: Oral care BID    Recommendations for follow up therapy are one component of a multi-disciplinary discharge planning process, led by the attending physician.  Recommendations may be updated based on patient status, additional functional criteria and insurance authorization.  Follow up Recommendations Skilled nursing-short term rehab (<3 hours/day)      Assistance Recommended at Discharge Frequent or constant Supervision/Assistance  Functional Status Assessment    Frequency and Duration            Prognosis        Swallow Study   General HPI: Elijah White is an 81 y.o. male with medical history significant for ESRD on HD MWF, aortic stenosis status post TAVR 2021, nonobstructive CAD, Parkinson's disease, PVD on Plavix, s/p left BKA, HTN, IIDM, HLD, admitted from [facility] on 1/23.  He was admitted from 1/11-03/2020 for acute  cholecystitis managed with percutaneous cholecystectomy tube which was unintentionally dislodged and subsequently removed on 1/19 and was discharged with oral antibiotics.  He returns with ongoing acute cholecystitis, underwent cholecystostomy tube placement by IR on 1/24. Reports ongoing nausea.  Consult for SLP swallow eval received 1/27.  Pt was followed by SLP during recent admission and was D/Cd on a dysphagia 2/chopped diet and thin liquids.  Modified diet was related to his mentation, missing teeth, and inconsistent communication from patient about what he was able and/or willing to  chew. Type of Study: Bedside Swallow Evaluation Previous Swallow Assessment: see HPI Diet Prior to this Study: NPO Temperature Spikes Noted: No Respiratory Status: Room air History of Recent Intubation: No Behavior/Cognition: Alert;Confused Oral Cavity Assessment: Within Functional Limits Oral Care Completed by SLP: No Oral Cavity - Dentition: Missing dentition;Poor condition Vision: Impaired for self-feeding Self-Feeding Abilities: Needs assist Patient Positioning: Upright in bed Baseline Vocal Quality: Normal Volitional Cough: Strong Volitional Swallow: Able to elicit    Oral/Motor/Sensory Function Overall Oral Motor/Sensory Function: Within functional limits   Ice Chips Ice chips: Within functional limits   Thin Liquid Thin Liquid: Within functional limits    Nectar Thick Nectar Thick Liquid: Not tested   Honey Thick Honey Thick Liquid: Not tested   Puree Puree: Within functional limits   Solid     Solid: Within functional limits     Elijah White L. Tivis Ringer, [place] Office number [phone] Pager [phone]  Elijah White 03/31/2021,2:24 PM

## 2021-04-01 ENCOUNTER — Inpatient Hospital Stay (HOSPITAL_COMMUNITY): Payer: Medicare Other

## 2021-04-01 LAB — PROTEIN, PLEURAL OR PERITONEAL FLUID: Total protein, fluid: 3.5 g/dL

## 2021-04-01 LAB — CBC WITH DIFFERENTIAL/PLATELET
Abs Immature Granulocytes: 0.12 10*3/uL — ABNORMAL HIGH (ref 0.00–0.07)
Basophils Absolute: 0.1 10*3/uL (ref 0.0–0.1)
Basophils Relative: 0 %
Eosinophils Absolute: 0.2 10*3/uL (ref 0.0–0.5)
Eosinophils Relative: 1 %
HCT: 26.4 % — ABNORMAL LOW (ref 39.0–52.0)
Hemoglobin: 9.2 g/dL — ABNORMAL LOW (ref 13.0–17.0)
Immature Granulocytes: 1 %
Lymphocytes Relative: 5 %
Lymphs Abs: 0.9 10*3/uL (ref 0.7–4.0)
MCH: 34.8 pg — ABNORMAL HIGH (ref 26.0–34.0)
MCHC: 34.8 g/dL (ref 30.0–36.0)
MCV: 100 fL (ref 80.0–100.0)
Monocytes Absolute: 1.5 10*3/uL — ABNORMAL HIGH (ref 0.1–1.0)
Monocytes Relative: 7 %
Neutro Abs: 16.9 10*3/uL — ABNORMAL HIGH (ref 1.7–7.7)
Neutrophils Relative %: 86 %
Platelets: 501 10*3/uL — ABNORMAL HIGH (ref 150–400)
RBC: 2.64 MIL/uL — ABNORMAL LOW (ref 4.22–5.81)
RDW: 14.3 % (ref 11.5–15.5)
WBC: 19.7 10*3/uL — ABNORMAL HIGH (ref 4.0–10.5)
nRBC: 0 % (ref 0.0–0.2)

## 2021-04-01 LAB — BASIC METABOLIC PANEL
Anion gap: 16 — ABNORMAL HIGH (ref 5–15)
BUN: 21 mg/dL (ref 8–23)
CO2: 23 mmol/L (ref 22–32)
Calcium: 8.8 mg/dL — ABNORMAL LOW (ref 8.9–10.3)
Chloride: 95 mmol/L — ABNORMAL LOW (ref 98–111)
Creatinine, Ser: 4.85 mg/dL — ABNORMAL HIGH (ref 0.61–1.24)
GFR, Estimated: 11 mL/min — ABNORMAL LOW (ref >60)
Glucose, Bld: 104 mg/dL — ABNORMAL HIGH (ref 70–99)
Potassium: 3.7 mmol/L (ref 3.5–5.1)
Sodium: 134 mmol/L — ABNORMAL LOW (ref 135–145)

## 2021-04-01 LAB — RENAL FUNCTION PANEL
Albumin: 2 g/dL — ABNORMAL LOW (ref 3.5–5.0)
Anion gap: 14 (ref 5–15)
BUN: 23 mg/dL (ref 8–23)
CO2: 23 mmol/L (ref 22–32)
Calcium: 9 mg/dL (ref 8.9–10.3)
Chloride: 97 mmol/L — ABNORMAL LOW (ref 98–111)
Creatinine, Ser: 5.38 mg/dL — ABNORMAL HIGH (ref 0.61–1.24)
GFR, Estimated: 10 mL/min — ABNORMAL LOW (ref >60)
Glucose, Bld: 108 mg/dL — ABNORMAL HIGH (ref 70–99)
Phosphorus: 4.9 mg/dL — ABNORMAL HIGH (ref 2.5–4.6)
Potassium: 3.9 mmol/L (ref 3.5–5.1)
Sodium: 134 mmol/L — ABNORMAL LOW (ref 135–145)

## 2021-04-01 LAB — BODY FLUID CELL COUNT WITH DIFFERENTIAL
Eos, Fluid: 0 %
Lymphs, Fluid: 29 %
Monocyte-Macrophage-Serous Fluid: 17 % — ABNORMAL LOW (ref 50–90)
Neutrophil Count, Fluid: 54 % — ABNORMAL HIGH (ref 0–25)
Total Nucleated Cell Count, Fluid: 1264 cu mm — ABNORMAL HIGH (ref 0–1000)

## 2021-04-01 LAB — CULTURE, BLOOD (ROUTINE X 2)
Culture: NO GROWTH
Culture: NO GROWTH
Special Requests: ADEQUATE

## 2021-04-01 LAB — HEPATITIS B SURFACE ANTIGEN: Hepatitis B Surface Ag: NONREACTIVE

## 2021-04-01 LAB — GLUCOSE, CAPILLARY
Glucose-Capillary: 96 mg/dL (ref 70–99)
Glucose-Capillary: 101 mg/dL — ABNORMAL HIGH (ref 70–99)
Glucose-Capillary: 104 mg/dL — ABNORMAL HIGH (ref 70–99)
Glucose-Capillary: 109 mg/dL — ABNORMAL HIGH (ref 70–99)

## 2021-04-01 LAB — HEPATITIS B SURFACE ANTIBODY,QUALITATIVE: Hep B S Ab: NONREACTIVE

## 2021-04-01 MED ORDER — LIDOCAINE-PRILOCAINE 2.5-2.5 % EX CREA
1.0000 "application " | TOPICAL_CREAM | CUTANEOUS | Status: DC | PRN
  Filled 2021-04-01: qty 5

## 2021-04-01 MED ORDER — PENTAFLUOROPROP-TETRAFLUOROETH EX AERO: 1.0000 "application " | INHALATION_SPRAY | CUTANEOUS | Status: DC | PRN

## 2021-04-01 MED ORDER — LIDOCAINE HCL (PF) 1 % IJ SOLN
INTRAMUSCULAR | Status: AC
  Filled 2021-04-01: qty 30

## 2021-04-01 MED ORDER — LIDOCAINE HCL (PF) 1 % IJ SOLN
5.0000 mL | INTRAMUSCULAR | Status: DC | PRN
  Filled 2021-04-01: qty 5

## 2021-04-01 MED ORDER — SODIUM CHLORIDE 0.9 % IV SOLN: 100.0000 mL | INTRAVENOUS | Status: DC | PRN

## 2021-04-01 MED ORDER — FLUCONAZOLE 100MG IVPB
100.0000 mg | INTRAVENOUS | Status: DC
  Administered 2021-04-02 – 2021-04-04 (×3): 100 mg via INTRAVENOUS
  Filled 2021-04-01 (×4): qty 50

## 2021-04-01 NOTE — Procedures (Signed)
PROCEDURE SUMMARY:  Successful image-guided right thoracentesis. Yielded 1.1 liters of dark red fluid. Patient tolerated procedure well. EBL < 1mL No immediate complications.  Specimen was sent for labs. Post procedure CXR shows no pneumothorax.  Please see imaging section of Epic for full dictation.  Joaquim Nam PA-C 04/01/2021 1:19 PM

## 2021-04-01 NOTE — Progress Notes (Signed)
Referring Physician(s): Hazeline Junker, MD  Supervising Physician: Roanna Banning  Patient Status:  [facility] - In-pt  Chief Complaint: Follow up percutaneous cholecystostomy placed 03/28/21 in IR  Subjective:  Patient seen in IR during thoracentesis today - he reports ongoing nausea, vomiting, abdominal pain and hiccups. He states he is hungry but every time he eats something solid it "comes right back up." He has RUQ abdominal pain which is not really different than previous days, he thinks the pain in the middle of his abdomen has worsened. He is most bothered by the hiccups right now and when he will be able to leave the hospital.   Allergies: Patient has no known allergies.  Medications: Prior to Admission medications   Medication Sig Start Date End Date Taking? Authorizing Provider  acetaminophen (TYLENOL) 500 MG tablet Take 1,000 mg by mouth every 6 (six) hours as needed for moderate pain or headache.   Yes [provider]  atorvastatin (LIPITOR) 10 MG tablet Take 1 tablet (10 mg total) by mouth in the morning. 08/16/20  Yes Rhyne, Samantha J, PA-C  B Complex-C-Folic Acid (DIALYVITE 800) 0.8 MG TABS Take 1 tablet by mouth in the morning. 05/08/19  Yes [provider]  brimonidine (ALPHAGAN) 0.15 % ophthalmic solution Place 1 drop into the right eye 3 (three) times daily.   Yes [provider]  carbidopa-levodopa (SINEMET) 25-100 MG tablet Take 1 tablet by mouth 3 (three) times daily. Patient taking differently: Take 1 tablet by mouth 3 (three) times daily. 1 TABLET @ 8am/ 12pm/ 4pm 10/19/20 05/13/21 Yes Curatolo, Adam, DO  clopidogrel (PLAVIX) 75 MG tablet Take 1 tablet (75 mg total) by mouth daily with breakfast. 07/25/20  Yes Cephus Shelling, MD  docusate sodium (COLACE) 100 MG capsule Take 100 mg by mouth 2 (two) times daily.   Yes [provider]  dorzolamide-timolol (COSOPT) 22.3-6.8 MG/ML ophthalmic solution Place 1 drop into the right eye 2 (two)  times daily. 06/25/17  Yes [provider]  ferric citrate (AURYXIA) 1 GM 210 MG(Fe) tablet Take 420 mg by mouth 3 (three) times daily with meals.   Yes [provider]  gabapentin (NEURONTIN) 100 MG capsule Take 1 capsule (100 mg total) by mouth daily. 10/19/20 05/13/21 Yes Curatolo, Adam, DO  hydroxypropyl methylcellulose / hypromellose (GONIOSOFT) 2.5 % ophthalmic solution Place 1 drop into the left eye 2 (two) times daily as needed for dry eyes.   Yes [provider]  Melatonin 5 MG CAPS Take 5 mg by mouth at bedtime.   Yes [provider]  metoprolol tartrate (LOPRESSOR) 25 MG tablet Take 0.5 tablets (12.5 mg total) by mouth 2 (two) times daily. Hold if sbp less than 100 03/26/21 03/26/22 Yes Albertine Grates, MD  nystatin (MYCOSTATIN) 100000 UNIT/ML suspension Take 5 mLs (500,000 Units total) by mouth 4 (four) times daily. 03/26/21  Yes Albertine Grates, MD  pantoprazole (PROTONIX) 40 MG tablet Take 1 tablet (40 mg total) by mouth daily at 12 noon. 08/16/20  Yes Rhyne, Ames Coupe, PA-C  pilocarpine (PILOCAR) 4 % ophthalmic solution Place 1 drop into the right eye 4 (four) times daily.    Yes [provider]  polyethylene glycol (MIRALAX / GLYCOLAX) 17 g packet Take 17 g by mouth daily. 03/26/21  Yes Albertine Grates, MD  ROCKLATAN 0.02-0.005 % SOLN Place 1 drop into the right eye at bedtime. 06/20/20  Yes [provider]  senna-docusate (SENOKOT-S) 8.6-50 MG tablet Take 1 tablet by mouth at  bedtime. 03/26/21  Yes Florencia Reasons, MD  sevelamer carbonate (RENVELA) 800 MG tablet Take 2 tablets (1,600 mg total) by mouth 3 (three) times daily with meals. 03/26/21  Yes Florencia Reasons, MD  Sodium Phosphates (RA SALINE ENEMA RE) Place 1 enema rectally daily as needed (for constipation not relieved by bisacodyl).   Yes [provider]  blood glucose meter kit and supplies KIT Dispense based on patient and insurance preference. Use up to four times daily as directed. (FOR ICD-9 250.00,  250.01). For QAC - HS accuchecks. 03/30/18   Thurnell Lose, MD  Doxercalciferol (HECTOROL IV) Doxercalciferol (Hectorol) 08/09/20 08/28/21  [provider]  Methoxy PEG-Epoetin Beta (MIRCERA IJ) Mircera 09/05/20 09/04/21  [provider]  ondansetron (ZOFRAN) 4 MG tablet Take 1 tablet (4 mg total) by mouth daily as needed for nausea or vomiting. Patient not taking: Reported on 03/27/2021 03/26/21   Florencia Reasons, MD  [facility] VERIO test strip USE AS DIRECTED TO TEST FOUR TIMES A DAY 07/31/18   [name], MD     Vital Signs: BP (!) 113/91    Pulse 89    Temp (!) 97.5 F (36.4 C) (Oral)    Resp 19    Ht $R'5\' 5"'Pd$  (1.651 m)    Wt 150 lb 5.7 oz (68.2 kg)    SpO2 99%    BMI 25.02 kg/m   Physical Exam Vitals and nursing note reviewed.  Constitutional:      General: He is not in acute distress. HENT:     Head: Normocephalic.  Cardiovascular:     Rate and Rhythm: Normal rate.  Pulmonary:     Effort: Pulmonary effort is normal.  Abdominal:     Tenderness: There is abdominal tenderness (RUQ at drain insertion site).     Comments: (+) RUQ drain to gravity with ~20 cc thick red output. Insertion site unremarkable.   Musculoskeletal:     Comments: (+) left BKA  Skin:    General: Skin is warm and dry.  Neurological:     Mental Status: He is alert. Mental status is at baseline.    Imaging: CT ABDOMEN PELVIS WO CONTRAST  Result Date: 03/31/2021 CLINICAL DATA:  Nausea and vomiting.  On dialysis. EXAM: CT ABDOMEN AND PELVIS WITHOUT CONTRAST TECHNIQUE: Multidetector CT imaging of the abdomen and pelvis was performed following the standard protocol without IV contrast. RADIATION DOSE REDUCTION: This exam was performed according to the departmental dose-optimization program which includes automated exposure control, adjustment of the mA and/or kV according to patient size and/or use of iterative reconstruction technique. COMPARISON:  Abdominal x-ray 03/31/2021. CT abdomen and pelvis  03/23/2021. FINDINGS: Lower chest: There is a moderate-sized right pleural effusion which has mildly increased. There is compressive atelectasis of the right lower lobe. There is a small left pleural effusion which appears stable. TAVR present. Hepatobiliary: The gallbladder is dilated and thick-walled. Heterogeneous hyperdensity is again seen within the gallbladder likely related to hemorrhage. Small layering gallstones are again noted. There is a new percutaneous cholecystostomy tube in place. There is a small amount of air in the gallbladder likely related to tube placement. There is no definite biliary ductal dilatation. No focal liver lesions are seen. Pancreas: Unremarkable. No pancreatic ductal dilatation or surrounding inflammatory changes. Spleen: Normal in size without focal abnormality. Adrenals/Urinary Tract: 2.5 x 2.1 cm left adrenal nodule is indeterminate and unchanged. Right adrenal gland within normal limits. There is bilateral renal atrophy. No urinary tract calculi or hydronephrosis. Bladder is decompressed  by Foley catheter. Stomach/Bowel: Stomach is within normal limits. Appendix appears normal. No evidence of bowel wall thickening, distention, or inflammatory changes. Vascular/Lymphatic: Aortic atherosclerosis. No enlarged abdominal or pelvic lymph nodes. Reproductive: Prostate radiotherapy seeds are again noted. Other: There is a small amount of free fluid in the pelvis similar to prior study. There is a small fat containing left inguinal hernia. Mild body wall edema persists. There is also a fat containing umbilical hernia. Musculoskeletal: No acute or significant osseous findings. IMPRESSION: 1. New cholecystostomy tube in place. 2. The gallbladder is dilated. Cholelithiasis again seen. Heterogeneous hyperdensity in the gallbladder likely represents hemorrhage, unchanged. 3. Moderate right pleural effusion has increased. Small left pleural effusion is stable. 4. Small volume ascites and body  wall edema are stable. 5.  Aortic Atherosclerosis (ICD10-I70.0). Electronically Signed   By: Ronney Asters M.D.   On: 03/31/2021 18:22   NM Hepatobiliary Liver Func  Result Date: 03/28/2021 CLINICAL DATA:  Ultrasound CT findings concern for acute cholecystitis. EXAM: NUCLEAR MEDICINE HEPATOBILIARY IMAGING TECHNIQUE: Sequential images of the abdomen were obtained out to 60 minutes following intravenous administration of radiopharmaceutical. RADIOPHARMACEUTICALS:  5.3 mCi Tc-8m  Choletec IV COMPARISON:  Ultrasound 03/27/2021, CT 03/23/2021 FINDINGS: Prompt clearance radiotracer from blood pool and homogeneous uptake within liver. Counts are present within the common bile duct by 15 minutes. Counts reached the proximal small bowel by 20 minutes. No filling of the gallbladder at 60 minutes. IV morphine was administered to augment filling of the gallbladder. No filling of the gallbladder present on 30 minutes of post morphine imaging. IMPRESSION: Non filling the gallbladder is consistent with ACUTE CHOLECYSTITIS. Patent common bile duct. These results will be called to the ordering clinician or representative by the Radiologist Assistant, and communication documented in the PACS or [employer]. Electronically Signed   By: Suzy Bouchard M.D.   On: 03/28/2021 16:02   IR Perc Cholecystostomy  Result Date: 03/28/2021 INDICATION: 81 year old male referred for percutaneous cholecystostomy EXAM: CHOLECYSTOSTOMY MEDICATIONS: 2 g Mefoxin; The antibiotic was administered within an appropriate time frame prior to the initiation of the procedure. ANESTHESIA/SEDATION: Moderate (conscious) sedation was employed during this procedure. A total of Versed 1.0 mg and Fentanyl 25 mcg was administered intravenously. Moderate Sedation Time: 13 minutes. The patient's level of consciousness and vital signs were monitored continuously by radiology nursing throughout the procedure under my direct supervision. FLUOROSCOPY TIME:   Fluoroscopy Time: 0 minutes 42 seconds (3 mGy). COMPLICATIONS: None PROCEDURE: Informed written consent was obtained from the patient and the patient's family after a thorough discussion of the procedural risks, benefits and alternatives. All questions were addressed. Maximal Sterile Barrier Technique was utilized including caps, mask, sterile gowns, sterile gloves, sterile drape, hand hygiene and skin antiseptic. A timeout was performed prior to the initiation of the procedure. Ultrasound survey of the right upper quadrant was performed for planning purposes. Once the patient is prepped and draped in the usual sterile fashion, the skin and subcutaneous tissues overlying the gallbladder were generously infiltrated 1% lidocaine for local anesthesia. A coaxial needle was advanced under ultrasound guidance through the skin subcutaneous tissues and a small segment of liver into the gallbladder lumen. With removal of the stylet, spontaneous dark bile drainage occurred. Using modified Seldinger technique, a 10 French drain was placed into the gallbladder fossa, with aspiration of the sample for the lab. Contrast injection confirmed position of the tube within the gallbladder lumen. Drainage catheter was attached to gravity drain with a suture retention placed. Patient  tolerated the procedure well and remained hemodynamically stable throughout. No complications were encountered and no significant blood loss encountered. IMPRESSION: Status post image guided percutaneous cholecystostomy. Signed, Dulcy Fanny. Dellia Nims, RPVI [facility] Electronically Signed   By: Corrie Mckusick D.O.   On: 03/28/2021 17:26   DG Abd Portable 1V  Result Date: 03/31/2021 CLINICAL DATA:  Emesis EXAM: PORTABLE ABDOMEN - 1 VIEW COMPARISON:  Abdominal radiograph dated March 29, 2021 FINDINGS: Nonobstructive bowel gas pattern. Surgical drain noted in the right upper quadrant. Partially  visualized left lung bases clear. IMPRESSION: Nonobstructive bowel gas pattern. Electronically Signed   By: Yetta Glassman M.D.   On: 03/31/2021 10:49   DG Abd Portable 1V  Result Date: 03/29/2021 CLINICAL DATA:  Emesis for 2-3 days, history of cancer and diabetes in an 81 year old male. EXAM: PORTABLE ABDOMEN - 1 VIEW COMPARISON:  CT of March 23, 2021. FINDINGS: A percutaneous cholecystostomy tube is partially visualized projecting over the RIGHT lower quadrant. RIGHT and LEFT hemidiaphragmatic contours are excluded from view. Imaging excludes the upper abdomen on today's study. There is no sign of bowel obstruction. Small amount of stool and gas present in the rectum. Brachytherapy seeds are present in the prostate. On limited assessment there is no acute regional skeletal process. IMPRESSION: 1. No sign of bowel obstruction. Imaging excludes the upper abdomen on today's study. 2. Cholecystostomy tube partially visualized projecting over the RIGHT lower quadrant. Electronically Signed   By: Zetta Bills M.D.   On: 03/29/2021 14:47    Labs:  CBC: Recent Labs    03/29/21 0346 03/30/21 0241 03/31/21 0836 04/01/21 0154  WBC 14.4* 17.1* 13.7* 19.7*  HGB 8.4* 8.8* 9.5* 9.2*  HCT 25.1* 25.7* 28.6* 26.4*  PLT 427* 453* 506* 501*    COAGS: Recent Labs    03/28/21 0319  INR 1.2    BMP: Recent Labs    03/29/21 0346 03/30/21 0241 04/01/21 0154 04/01/21 1148  NA 128* 132* 134* 134*  K 4.2 4.1 3.7 3.9  CL 95* 95* 95* 97*  CO2 21* $Remov'23 23 23  'bICZrr$ GLUCOSE 84 76 104* 108*  BUN 43* 29* 21 23  CALCIUM 8.3* 9.1 8.8* 9.0  CREATININE 7.04* 5.55* 4.85* 5.38*  GFRNONAA 7* 10* 11* 10*    LIVER FUNCTION TESTS: Recent Labs    03/26/21 0346 03/27/21 1143 03/28/21 0319 03/31/21 1102 04/01/21 1148  BILITOT 0.7 1.0 0.9 0.4  --   AST 18 35 122* 20  --   ALT $Re'6 6 8 22  'sbD$ --   ALKPHOS 135* 191* 409* 133*  --   PROT 5.3* 6.8 5.3* 7.2  --   ALBUMIN 1.7* 2.4* 1.7* 3.4* 2.0*    Assessment and  Plan:  81 y/o M with history of acute cholecystitis s/p percutaneous cholecystostomy originally placed 03/16/21, inadvertently removed 03/23/21 and replaced in IR 03/28/21 seen today for drain follow up. On exam fluid in gravity bag is thick red, during right thoracentesis today fluid noted to be the same consistency/color. Thoracentesis yielding 1.1 L pleural fluid.  CT abd/pelvis w/o contrast yesterday showed dilated gallbladder with cholecystostomy tube in appropriate position with cholelithiasis and heterogeneous hyperdensity in the gallbladder likely representing hemorrhage (unchanged).   Drain Location: RUQ Size: Fr size: 10 Fr Date of placement: 03/28/21  Currently to: Drain collection device: gravity 24 hour output:  Output by Drain (mL) 03/30/21 0701 - 03/30/21 1900 03/30/21 1901 - 03/31/21 0700 03/31/21 0701 - 03/31/21 1900 03/31/21 1901 -  04/01/21 0700 04/01/21 0701 - 04/01/21 1423  Biliary Tube Cook slip-coat 10 Fr. RUQ 5   35     Interval imaging/drain manipulation:  None  Current examination: ~20 cc thick red fluid in gravity bag  Insertion site unremarkable. Suture and stat lock in place. Dressed appropriately.   Plan: Continue TID flushes with 5 cc NS. Record output Q shift. Dressing changes QD or PRN if soiled.  Call IR APP or on call IR MD if difficulty flushing or sudden change in drain output.  Repeat imaging/possible drain injection once output < 10 mL/QD (excluding flush material.)  Discharge planning: Please contact IR APP or on call IR MD prior to patient d/c to ensure appropriate follow up plans are in place. Typically patient will follow up with IR clinic 10-14 days post d/c for repeat imaging/possible drain injection. IR scheduler will contact patient with date/time of appointment. Patient will need to flush drain QD with 5 cc NS, record output QD, dressing changes every 2-3 days or earlier if soiled.   IR will continue to follow - please call with questions  or concerns.  Electronically Signed: Joaquim Nam, PA-C 04/01/2021, 1:19 PM   I spent a total of 15 Minutes at the the patient's bedside AND on the patient's hospital floor or unit, greater than 50% of which was counseling/coordinating care for cholecystostomy follow up.

## 2021-04-01 NOTE — Progress Notes (Signed)
Refused CBG at midnight-continued to report stomach distress- mix nausea and pain. Biliary drain flushed twice and emptied 30ml of bloody drainage

## 2021-04-01 NOTE — Progress Notes (Addendum)
[facility] Progress Note   Subjective: Issues with nausea. Seen by GS this AM. HD 01/27 Net UF 2 liters. Next HD 04/03/2021.   Seen in room. Still nauseated. Says he won't go home until they figure out what is wrong with him.  Objective Vitals:   03/31/21 1230 03/31/21 1300 03/31/21 1320 03/31/21 1330  BP: (!) 122/31 (!) 110/22 (!) 156/23 (!) 113/91  Pulse: 72 94 84 89  Resp: 15 18 (!) 21 19  Temp:    (!) 97.5 F (36.4 C)  TempSrc:    Oral  SpO2:  100%  99%  Weight:      Height:       Physical Exam General: Very elderly chronically ill appearing male in NAD Heart: Normal S1 and S2; No murmurs, gallops, or rubs Lungs: Clear anteriorly and laterally Abdomen: NABS, Soft and non-tender, R percutaneous cholecystostomy  drain in place. Foley patent to gravity drain, clear yellow urine.  Extremities: L BKA no stump edema, no RLE edema Dialysis Access: L AVF +T/B    Additional Objective Labs: Basic Metabolic Panel: Recent Labs  Lab 03/28/21 0319 03/29/21 0346 03/30/21 0241 04/01/21 0154  NA 128* 128* 132* 134*  K 3.9 4.2 4.1 3.7  CL 94* 95* 95* 95*  CO2 25 21* 23 23  GLUCOSE 92 84 76 104*  BUN 37* 43* 29* 21  CREATININE 6.13* 7.04* 5.55* 4.85*  CALCIUM 8.2* 8.3* 9.1 8.8*  PHOS 4.9*  --   --   --    Liver Function Tests: Recent Labs  Lab 03/27/21 1143 03/28/21 0319 03/31/21 1102  AST 35 122* 20  ALT 6 8 22   ALKPHOS 191* 409* 133*  BILITOT 1.0 0.9 0.4  PROT 6.8 5.3* 7.2  ALBUMIN 2.4* 1.7* 3.4*   Recent Labs  Lab 03/27/21 1143 03/31/21 1102  LIPASE 30 29   CBC: Recent Labs  Lab 03/28/21 0319 03/28/21 0319 03/29/21 0346 03/30/21 0241 03/31/21 0836 04/01/21 0154  WBC 13.7*  --  14.4* 17.1* 13.7* 19.7*  NEUTROABS  --    < > 12.0* 14.7* 11.0* 16.9*  HGB 8.2*  --  8.4* 8.8* 9.5* 9.2*  HCT 24.7*  --  25.1* 25.7* 28.6* 26.4*  MCV 101.6*  --  100.8* 101.6* 101.1* 100.0  PLT 438*  --  427* 453* 506* 501*   < > = values in this interval  not displayed.   Blood Culture    Component Value Date/Time   SDES ABSCESS 03/28/2021 1710   SPECREQUEST GALLBLADDER 03/28/2021 1710   CULT  03/28/2021 1710    NO GROWTH 3 DAYS NO ANAEROBES ISOLATED; CULTURE IN PROGRESS FOR 5 DAYS Performed at [facility] [address], [place], [place] [address]    REPTSTATUS PENDING 03/28/2021 1710    Cardiac Enzymes: No results for input(s): CKTOTAL, CKMB, CKMBINDEX, TROPONINI in the last 168 hours. CBG: Recent Labs  Lab 03/30/21 2321 03/31/21 0342 03/31/21 0743 03/31/21 1438 04/01/21 0821  GLUCAP 89 66* 112* 102* 96   Iron Studies: No results for input(s): IRON, TIBC, TRANSFERRIN, FERRITIN in the last 72 hours. @lablastinr3 @ Studies/Results: CT ABDOMEN PELVIS WO CONTRAST  Result Date: 03/31/2021 CLINICAL DATA:  Nausea and vomiting.  On dialysis. EXAM: CT ABDOMEN AND PELVIS WITHOUT CONTRAST TECHNIQUE: Multidetector CT imaging of the abdomen and pelvis was performed following the standard protocol without IV contrast. RADIATION DOSE REDUCTION: This exam was performed according to the departmental dose-optimization program which includes automated exposure control, adjustment of the  mA and/or kV according to patient size and/or use of iterative reconstruction technique. COMPARISON:  Abdominal x-ray 03/31/2021. CT abdomen and pelvis 03/23/2021. FINDINGS: Lower chest: There is a moderate-sized right pleural effusion which has mildly increased. There is compressive atelectasis of the right lower lobe. There is a small left pleural effusion which appears stable. TAVR present. Hepatobiliary: The gallbladder is dilated and thick-walled. Heterogeneous hyperdensity is again seen within the gallbladder likely related to hemorrhage. Small layering gallstones are again noted. There is a new percutaneous cholecystostomy tube in place. There is a small amount of air in the gallbladder likely related to tube placement. There is no definite biliary ductal  dilatation. No focal liver lesions are seen. Pancreas: Unremarkable. No pancreatic ductal dilatation or surrounding inflammatory changes. Spleen: Normal in size without focal abnormality. Adrenals/Urinary Tract: 2.5 x 2.1 cm left adrenal nodule is indeterminate and unchanged. Right adrenal gland within normal limits. There is bilateral renal atrophy. No urinary tract calculi or hydronephrosis. Bladder is decompressed by Foley catheter. Stomach/Bowel: Stomach is within normal limits. Appendix appears normal. No evidence of bowel wall thickening, distention, or inflammatory changes. Vascular/Lymphatic: Aortic atherosclerosis. No enlarged abdominal or pelvic lymph nodes. Reproductive: Prostate radiotherapy seeds are again noted. Other: There is a small amount of free fluid in the pelvis similar to prior study. There is a small fat containing left inguinal hernia. Mild body wall edema persists. There is also a fat containing umbilical hernia. Musculoskeletal: No acute or significant osseous findings. IMPRESSION: 1. New cholecystostomy tube in place. 2. The gallbladder is dilated. Cholelithiasis again seen. Heterogeneous hyperdensity in the gallbladder likely represents hemorrhage, unchanged. 3. Moderate right pleural effusion has increased. Small left pleural effusion is stable. 4. Small volume ascites and body wall edema are stable. 5.  Aortic Atherosclerosis (ICD10-I70.0). Electronically Signed   By: Ronney Asters M.D.   On: 03/31/2021 18:22   DG Abd Portable 1V  Result Date: 03/31/2021 CLINICAL DATA:  Emesis EXAM: PORTABLE ABDOMEN - 1 VIEW COMPARISON:  Abdominal radiograph dated March 29, 2021 FINDINGS: Nonobstructive bowel gas pattern. Surgical drain noted in the right upper quadrant. Partially visualized left lung bases clear. IMPRESSION: Nonobstructive bowel gas pattern. Electronically Signed   By: Yetta Glassman M.D.   On: 03/31/2021 10:49   Medications:  chlorproMAZINE (THORAZINE) IV Stopped (03/30/21  2350)   fluconazole (DIFLUCAN) IV     piperacillin-tazobactam (ZOSYN)  IV 100 mL/hr at 04/01/21 0553    brimonidine  1 drop Right Eye TID   carbidopa-levodopa  1 tablet Oral TID   Chlorhexidine Gluconate Cloth  6 each Topical Daily   Chlorhexidine Gluconate Cloth  6 each Topical Q0600   cinacalcet  60 mg Oral Q M,W,F-HD   darbepoetin (ARANESP) injection - DIALYSIS  40 mcg Intravenous Q Wed-HD   dorzolamide-timolol  1 drop Right Eye BID   doxercalciferol  3 mcg Intravenous Q M,W,F-HD   gabapentin  100 mg Oral Daily   heparin injection (subcutaneous)  5,000 Units Subcutaneous Q8H   insulin aspart  0-6 Units Subcutaneous Q4H   metoprolol tartrate  12.5 mg Oral BID   Netarsudil-Latanoprost  1 drop Right Eye QHS   pantoprazole  40 mg Oral Daily   pilocarpine  1 drop Right Eye QID   polyethylene glycol  17 g Oral Daily   senna  1 tablet Oral BID   sodium chloride flush  5 mL Intracatheter Q8H     Dialysis Orders: [facility] MWF  4h  400/600  66kg 2/2 bath  P2  AVF  Hep none  - sensipar 60 mg po tiw  - hectorol 3 ug tiw IV  - mircera 75 ug q 2 wks, just ordered   Assessment/Plan: Abdominal pain: recent admit for acute cholecystitis, S/P perc chole tube 1/12- 1/17 per IR, dc'd on 1/22. Readmitted now w/ abd pain again and is SP new perc chole tube by IR 1/24. Per pmd / IR. Getting IV abx.  Atrial fib - recent onset, per pmd  ESRD - on HD MWF. Next HD 04/03/2021  BP/volume  - No evidence of volume overload by exam. Net UF 2 Liters with HD 03/31/2021. No post wt. Daily wts ordered. BP controlled. UF as tolerated.   Anemia ckd  - Hb 9.2 started ESA last admit darbe 40 weekly, will continue.  Metabolic bone disease -  changed auryxia to Renvela binders 2 ac last admit, phos 4.9. Continue sensipar and vdra. Ca in range.  Nutrition - Albumin <2. Protein supps ordered. 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 Elwanda Moger  NP-C 04/01/2021, 10:59 AM  [employer] [phone]

## 2021-04-01 NOTE — Progress Notes (Addendum)
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  She was recently admitted from 1/11-03/2020 for acute cholecystitis managed with percutaneous cholecystectomy tube which was unintentionally dislodged and subsequently removed on 1/19 and was discharged with oral antibiotics.  After being discharged to nursing facility, he developed severe abdominal pain with associated nausea and vomiting.  On presentation ,he was afebrile, lab work showed worsening leukocytosis.  Right upper quadrant ultrasound showed distended gallbladder with cholelithiasis, sludge, pericholecystic fluid.  Murphy sign was positive.  IR, general surgery consulted.  Underwent cholecystostomy tube placement by IR on 1/24.  Cultures have not shown any growth.  Hospital course remarkable for persistent nausea, vomiting. General surgery reconsulted and following  Assessment & Plan:   Principal Problem:   Acute cholecystitis Active Problems:   Type 2 diabetes mellitus with renal manifestations (HCC)   Essential hypertension   HLD (hyperlipidemia)   ESRD (end stage renal disease) (HCC)   Macrocytic anemia   RBBB   Blindness   S/P TAVR (transcatheter aortic valve replacement)   Anemia in chronic kidney disease   Secondary hyperparathyroidism of renal origin ([place])   PAD (peripheral artery disease) ([place])   DNR (do not resuscitate)   Pressure injury of skin   Acute cholecystitis: He was recently admitted from 1/11-03/2020 for acute cholecystitis managed with percutaneous cholecystectomy  tube which was unintentionally dislodged and subsequently removed on 1/19 and was discharged with oral antibiotics.  After being discharged to nursing facility, he developed severe abdominal pain with associated nausea and vomiting.  On presentation she was afebrile, lab work showed worsening leukocytosis.  Right upper quadrant ultrasound showed distended gallbladder with cholelithiasis, sludge, pericholecystic fluid. Murphy sign was positive. IR, general surgery consulted.  Underwent cholecystostomy tube placement by IR on 1/24.   Continue pain management supportive care.  Blood cultures no growth till date.  Aerobic/anaerobic culture have not shown any growth, no organisms.continue current antibiotics.  Plan for total of 10 days duration of antibiotics. Patient has been having consistent nausea/vomiting since last 2-3 days.   X-ray of the abdomen done here did not show any bowel obstruction.  CT abdomen/pelvis done on 1/27 showed dilated gallbladder.  He continues to complain of nausea and abdominal pain which is not exactly on the right upper quadrant.  General surgery continue to recommend against surgical intervention, continue right upper quadrant drain. Elevated white cell count.Blood cultures have been negative. He is having bowel movements  ESRD: On dialysis on MWF.  Nephrology following for dialysis  Bilateral pleural effusion: Moderate to large RIGHT and small LEFT pleural effusions with associated RIGHT basilar atelectasis as per CXR.  This finding was also present on last admission.  No respiratory symptoms.  Currently on room air.   Volume management with dialysis.  Underwent right sided  ultrasound-guided thoracentesis with removal of 1.1 L of dark red fluid.We will check fluid analysis.  Paroxysmal A. fib: Currently in normal sinus rhythm.  Not on anticoagulation due to concern for GI bleed.  On low-dose metoprolol for rate control.  Monitor on telemetry  Non-insulin-dependent diabetes  type 2: Recent A1c  of 7%.  Monitor blood sugars.  Currently on sliding scale insulin.  On gabapentin for diabetic neuropathy.  History of glaucoma: Continue eyedrops  Parkinson's disease: Continue Sinemet.  Delirium precautions  History of peripheral vascular disease : status post left BKA.  Takes Plavix , continued  History of urinary retention: On last admission Foley was placed and he was discharged with Foley.  Continue Foley catheter for now.  We recommend to follow-up with urology as an outpatient  Macrocytic anemia/esophageal candidiasis: Associated with ESRD.  No evidence of bleeding.  Hemoglobin stable.  He underwent EGD on last admission.  Biopsies showed candidiasis.  He has been started on 7 days course of fluconazole  Hyponatremia: Likely from volume overload.  Continue monitoring.  Volume management as per dialysis  Hiccups: Continue Thorazine as needed          DVT prophylaxis:Heparin [place] Code Status: DNR Family Communication:: Discussed with daughter on phone on 1/28 Patient status: Inpatient  Dispo: The patient is from: Skilled nursing facility              Anticipated d/c is to: Skilled nursing facility              Anticipated d/c date is: Likely  next week, after resolution of abdominal pain, nausea Consultants: IR, general surgery, nephrology  Procedures: None  Antimicrobials:  Anti-infectives (From admission, onward)    Start     Dose/Rate Route Frequency Ordered Stop   03/31/21 1200  fluconazole (DIFLUCAN) IVPB 100 mg        100 mg 50 mL/hr over 60 Minutes Intravenous Every 24 hours 03/31/21 1112     03/30/21 1245  fluconazole (DIFLUCAN) tablet 100 mg  Status:  Discontinued        100 mg Oral Daily 03/30/21 1157 03/31/21 1112   03/28/21 1645  cefOXitin (MEFOXIN) 2 g in sodium chloride 0.9 % 100 mL IVPB       Note to Pharmacy: Please send to tube station 17 radiology- for procedure   2 g 200 mL/hr over 30 Minutes Intravenous  Once 03/28/21 1550  03/28/21 1720   03/27/21 1645  vancomycin (VANCOREADY) IVPB 1500 mg/300 mL        1,500 mg 150 mL/hr over 120 Minutes Intravenous  Once 03/27/21 1632 03/27/21 1942   03/27/21 1630  piperacillin-tazobactam (ZOSYN) IVPB 2.25 g        2.25 g 100 mL/hr over 30 Minutes Intravenous Every 8 hours 03/27/21 1617         Subjective: Patient seen and examined at the bedside this morning.  Continues to complain of nausea and vague abdominal discomfort.  Also bothered by hiccups.  Having bowel movement.  Objective: Vitals:   03/31/21 1230 03/31/21 1300 03/31/21 1320 03/31/21 1330  BP: (!) 122/31 (!) 110/22 (!) 156/23 (!) 113/91  Pulse: 72 94 84 89  Resp: 15 18 (!) 21 19  Temp:    (!) 97.5 F (36.4 C)  TempSrc:    Oral  SpO2:  100%  99%  Weight:      Height:        Intake/Output Summary (Last 24 hours) at 04/01/2021 1142 Last data filed at 04/01/2021 0553 Gross per 24 hour  Intake 110 ml  Output 2110 ml  Net -2000 ml   Filed Weights   03/29/21 0739 03/29/21 1109 03/31/21 1020  Weight: 70.3 kg 68.2 kg 68.2 kg    Examination:   General exam: Very deconditioned, chronically ill looking, HEENT: Legally  blind Respiratory system:  no wheezes or crackles  Cardiovascular system: S1 & S2 heard, RRR.  Gastrointestinal system: Abdomen is nondistended, soft , generalized tenderness present.  Right upper quadrant drain with sanguinous output Central nervous system: Alert and oriented Extremities: Left BKA, AV fistula on the left upper extremity Skin: No rashes, no ulcers,no icterus    Data Reviewed: I have personally reviewed following labs and imaging studies  CBC: Recent Labs  Lab 03/28/21 0319 03/29/21 0346 03/30/21 0241 03/31/21 0836 04/01/21 0154  WBC 13.7* 14.4* 17.1* 13.7* 19.7*  NEUTROABS  --  12.0* 14.7* 11.0* 16.9*  HGB 8.2* 8.4* 8.8* 9.5* 9.2*  HCT 24.7* 25.1* 25.7* 28.6* 26.4*  MCV 101.6* 100.8* 101.6* 101.1* 100.0  PLT 438* 427* 453* 506* 540*   Basic Metabolic  Panel: Recent Labs  Lab 03/27/21 1143 03/28/21 0319 03/29/21 0346 03/30/21 0241 04/01/21 0154  NA 131* 128* 128* 132* 134*  K 4.6 3.9 4.2 4.1 3.7  CL 93* 94* 95* 95* 95*  CO2 22 25 21* 23 23  GLUCOSE 123* 92 84 76 104*  BUN 35* 37* 43* 29* 21  CREATININE 5.60* 6.13* 7.04* 5.55* 4.85*  CALCIUM 8.8* 8.2* 8.3* 9.1 8.8*  PHOS  --  4.9*  --   --   --    GFR: Estimated Creatinine Clearance: 10.2 mL/min (A) (by C-G formula based on SCr of 4.85 mg/dL (H)). Liver Function Tests: Recent Labs  Lab 03/26/21 0346 03/27/21 1143 03/28/21 0319 03/31/21 1102  AST 18 35 122* 20  ALT 6 6 8 22   ALKPHOS 135* 191* 409* 133*  BILITOT 0.7 1.0 0.9 0.4  PROT 5.3* 6.8 5.3* 7.2  ALBUMIN 1.7* 2.4* 1.7* 3.4*   Recent Labs  Lab 03/27/21 1143 03/31/21 1102  LIPASE 30 29   No results for input(s): AMMONIA in the last 168 hours. Coagulation Profile: Recent Labs  Lab 03/28/21 0319  INR 1.2   Cardiac Enzymes: No results for input(s): CKTOTAL, CKMB, CKMBINDEX, TROPONINI in the last 168 hours. BNP (last 3 results) No results for input(s): PROBNP in the last 8760 hours. HbA1C: No results for input(s): HGBA1C in the last 72 hours. CBG: Recent Labs  Lab 03/30/21 2321 03/31/21 0342 03/31/21 0743 03/31/21 1438 04/01/21 0821  GLUCAP 89 66* 112* 102* 96   Lipid Profile: No results for input(s): CHOL, HDL, LDLCALC, TRIG, CHOLHDL, LDLDIRECT in the last 72 hours. Thyroid Function Tests: No results for input(s): TSH, T4TOTAL, FREET4, T3FREE, THYROIDAB in the last 72 hours. Anemia Panel: No results for input(s): VITAMINB12, FOLATE, FERRITIN, TIBC, IRON, RETICCTPCT in the last 72 hours. Sepsis Labs: No results for input(s): PROCALCITON, LATICACIDVEN in the last 168 hours.  Recent Results (from the past 240 hour(s))  Resp Panel by RT-PCR (Flu A&B, Covid) Nasopharyngeal Swab     Status: None   Collection Time: 03/22/21 12:42 PM   Specimen: Nasopharyngeal Swab; Nasopharyngeal(NP) swabs in vial  transport medium  Result Value Ref Range Status   SARS Coronavirus 2 by RT PCR NEGATIVE NEGATIVE Final    Comment: (NOTE) SARS-CoV-2 target nucleic acids are NOT DETECTED.  The SARS-CoV-2 RNA is generally detectable in upper respiratory specimens during the acute phase of infection. The lowest concentration of SARS-CoV-2 viral copies this assay can detect is 138 copies/mL. A negative result does not preclude SARS-Cov-2 infection and should not be used as the sole basis for treatment or other patient management decisions. A negative result may occur with  improper specimen collection/handling, submission of specimen other  than nasopharyngeal swab, presence of viral mutation(s) within the areas targeted by this assay, and inadequate number of viral copies(<138 copies/mL). A negative result must be combined with clinical observations, patient history, and epidemiological information. The expected result is Negative.  Fact Sheet for Patients:  EntrepreneurPulse.com.au  Fact Sheet for Healthcare Providers:  IncredibleEmployment.be  This test is no t yet approved or cleared by the [place] FDA and  has been authorized for detection and/or diagnosis of SARS-CoV-2 by FDA under an Emergency Use Authorization (EUA). This EUA will remain  in effect (meaning this test can be used) for the duration of the COVID-19 declaration under Section 564(b)(1) of the Act, 21 U.S.C.section 360bbb-3(b)(1), unless the authorization is terminated  or revoked sooner.       Influenza A by PCR NEGATIVE NEGATIVE Final   Influenza B by PCR NEGATIVE NEGATIVE Final    Comment: (NOTE) The Xpert Xpress SARS-CoV-2/FLU/RSV plus assay is intended as an aid in the diagnosis of influenza from Nasopharyngeal swab specimens and should not be used as a sole basis for treatment. Nasal washings and aspirates are unacceptable for Xpert Xpress SARS-CoV-2/FLU/RSV testing.  Fact  Sheet for Patients: EntrepreneurPulse.com.au  Fact Sheet for Healthcare Providers: IncredibleEmployment.be  This test is not yet approved or cleared by the [place] FDA and has been authorized for detection and/or diagnosis of SARS-CoV-2 by FDA under an Emergency Use Authorization (EUA). This EUA will remain in effect (meaning this test can be used) for the duration of the COVID-19 declaration under Section 564(b)(1) of the Act, 21 U.S.C. section 360bbb-3(b)(1), unless the authorization is terminated or revoked.  Performed at [facility], [place] [address] [place], [place] [address]   Resp Panel by RT-PCR (Flu A&B, Covid) Nasopharyngeal Swab     Status: None   Collection Time: 03/26/21 11:56 AM   Specimen: Nasopharyngeal Swab; Nasopharyngeal(NP) swabs in vial transport medium  Result Value Ref Range Status   SARS Coronavirus 2 by RT PCR NEGATIVE NEGATIVE Final    Comment: (NOTE) SARS-CoV-2 target nucleic acids are NOT DETECTED.  The SARS-CoV-2 RNA is generally detectable in upper respiratory specimens during the acute phase of infection. The lowest concentration of SARS-CoV-2 viral copies this assay can detect is 138 copies/mL. A negative result does not preclude SARS-Cov-2 infection and should not be used as the sole basis for treatment or other patient management decisions. A negative result may occur with  improper specimen collection/handling, submission of specimen other than nasopharyngeal swab, presence of viral mutation(s) within the areas targeted by this assay, and inadequate number of viral copies(<138 copies/mL). A negative result must be combined with clinical observations, patient history, and epidemiological information. The expected result is Negative.  Fact Sheet for Patients:  EntrepreneurPulse.com.au  Fact Sheet for Healthcare Providers:  IncredibleEmployment.be  This test is  no t yet approved or cleared by the [place] FDA and  has been authorized for detection and/or diagnosis of SARS-CoV-2 by FDA under an Emergency Use Authorization (EUA). This EUA will remain  in effect (meaning this test can be used) for the duration of the COVID-19 declaration under Section 564(b)(1) of the Act, 21 U.S.C.section 360bbb-3(b)(1), unless the authorization is terminated  or revoked sooner.       Influenza A by PCR NEGATIVE NEGATIVE Final   Influenza B by PCR NEGATIVE NEGATIVE Final    Comment: (NOTE) The Xpert Xpress SARS-CoV-2/FLU/RSV plus assay is intended as an aid in the diagnosis of influenza from Nasopharyngeal swab specimens  and should not be used as a sole basis for treatment. Nasal washings and aspirates are unacceptable for Xpert Xpress SARS-CoV-2/FLU/RSV testing.  Fact Sheet for Patients: EntrepreneurPulse.com.au  Fact Sheet for Healthcare Providers: IncredibleEmployment.be  This test is not yet approved or cleared by the [place] FDA and has been authorized for detection and/or diagnosis of SARS-CoV-2 by FDA under an Emergency Use Authorization (EUA). This EUA will remain in effect (meaning this test can be used) for the duration of the COVID-19 declaration under Section 564(b)(1) of the Act, 21 U.S.C. section 360bbb-3(b)(1), unless the authorization is terminated or revoked.  Performed at [facility], [place] [address], [place], [place] [address]   Resp Panel by RT-PCR (Flu A&B, Covid) Nasopharyngeal Swab     Status: None   Collection Time: 03/27/21  2:10 PM   Specimen: Nasopharyngeal Swab; Nasopharyngeal(NP) swabs in vial transport medium  Result Value Ref Range Status   SARS Coronavirus 2 by RT PCR NEGATIVE NEGATIVE Final    Comment: (NOTE) SARS-CoV-2 target nucleic acids are NOT DETECTED.  The SARS-CoV-2 RNA is generally detectable in upper respiratory specimens during the acute phase of  infection. The lowest concentration of SARS-CoV-2 viral copies this assay can detect is 138 copies/mL. A negative result does not preclude SARS-Cov-2 infection and should not be used as the sole basis for treatment or other patient management decisions. A negative result may occur with  improper specimen collection/handling, submission of specimen other than nasopharyngeal swab, presence of viral mutation(s) within the areas targeted by this assay, and inadequate number of viral copies(<138 copies/mL). A negative result must be combined with clinical observations, patient history, and epidemiological information. The expected result is Negative.  Fact Sheet for Patients:  EntrepreneurPulse.com.au  Fact Sheet for Healthcare Providers:  IncredibleEmployment.be  This test is no t yet approved or cleared by the [place] FDA and  has been authorized for detection and/or diagnosis of SARS-CoV-2 by FDA under an Emergency Use Authorization (EUA). This EUA will remain  in effect (meaning this test can be used) for the duration of the COVID-19 declaration under Section 564(b)(1) of the Act, 21 U.S.C.section 360bbb-3(b)(1), unless the authorization is terminated  or revoked sooner.       Influenza A by PCR NEGATIVE NEGATIVE Final   Influenza B by PCR NEGATIVE NEGATIVE Final    Comment: (NOTE) The Xpert Xpress SARS-CoV-2/FLU/RSV plus assay is intended as an aid in the diagnosis of influenza from Nasopharyngeal swab specimens and should not be used as a sole basis for treatment. Nasal washings and aspirates are unacceptable for Xpert Xpress SARS-CoV-2/FLU/RSV testing.  Fact Sheet for Patients: EntrepreneurPulse.com.au  Fact Sheet for Healthcare Providers: IncredibleEmployment.be  This test is not yet approved or cleared by the [place] FDA and has been authorized for detection and/or diagnosis of SARS-CoV-2  by FDA under an Emergency Use Authorization (EUA). This EUA will remain in effect (meaning this test can be used) for the duration of the COVID-19 declaration under Section 564(b)(1) of the Act, 21 U.S.C. section 360bbb-3(b)(1), unless the authorization is terminated or revoked.  Performed at [facility], [place] [address] [place], [place] [address]   Culture, blood (routine x 2)     Status: None   Collection Time: 03/27/21  4:03 PM   Specimen: BLOOD  Result Value Ref Range Status   Specimen Description BLOOD BLOOD RIGHT ARM  Final   Special Requests   Final    BOTTLES DRAWN AEROBIC AND ANAEROBIC  Blood Culture results may not be optimal due to an excessive volume of blood received in culture bottles   Culture   Final    NO GROWTH 5 DAYS Performed at [place] [address], [place], [place] [address]    Report Status 04/01/2021 FINAL  Final  Culture, blood (routine x 2)     Status: None   Collection Time: 03/27/21  6:49 PM   Specimen: BLOOD RIGHT HAND  Result Value Ref Range Status   Specimen Description BLOOD RIGHT HAND  Final   Special Requests   Final    BOTTLES DRAWN AEROBIC AND ANAEROBIC Blood Culture adequate volume   Culture   Final    NO GROWTH 5 DAYS Performed at [facility], [place] [address], [place], [place] [address]    Report Status 04/01/2021 FINAL  Final  Aerobic/Anaerobic Culture w Gram Stain (surgical/deep wound)     Status: None (Preliminary result)   Collection Time: 03/28/21  5:10 PM   Specimen: Abscess  Result Value Ref Range Status   Specimen Description ABSCESS  Final   Special Requests GALLBLADDER  Final   Gram Stain   Final    FEW WBC PRESENT,BOTH PMN AND MONONUCLEAR NO ORGANISMS SEEN    Culture   Final    NO GROWTH 3 DAYS NO ANAEROBES ISOLATED; CULTURE IN PROGRESS FOR 5 DAYS Performed at [facility], [place] [address] [place],  [address]    Report Status PENDING  Incomplete         Radiology Studies: CT  ABDOMEN PELVIS WO CONTRAST  Result Date: 03/31/2021 CLINICAL DATA:  Nausea and vomiting.  On dialysis. EXAM: CT ABDOMEN AND PELVIS WITHOUT CONTRAST TECHNIQUE: Multidetector CT imaging of the abdomen and pelvis was performed following the standard protocol without IV contrast. RADIATION DOSE REDUCTION: This exam was performed according to the departmental dose-optimization program which includes automated exposure control, adjustment of the mA and/or kV according to patient size and/or use of iterative reconstruction technique. COMPARISON:  Abdominal x-ray 03/31/2021. CT abdomen and pelvis 03/23/2021. FINDINGS: Lower chest: There is a moderate-sized right pleural effusion which has mildly increased. There is compressive atelectasis of the right lower lobe. There is a small left pleural effusion which appears stable. TAVR present. Hepatobiliary: The gallbladder is dilated and thick-walled. Heterogeneous hyperdensity is again seen within the gallbladder likely related to hemorrhage. Small layering gallstones are again noted. There is a new percutaneous cholecystostomy tube in place. There is a small amount of air in the gallbladder likely related to tube placement. There is no definite biliary ductal dilatation. No focal liver lesions are seen. Pancreas: Unremarkable. No pancreatic ductal dilatation or surrounding inflammatory changes. Spleen: Normal in size without focal abnormality. Adrenals/Urinary Tract: 2.5 x 2.1 cm left adrenal nodule is indeterminate and unchanged. Right adrenal gland within normal limits. There is bilateral renal atrophy. No urinary tract calculi or hydronephrosis. Bladder is decompressed by Foley catheter. Stomach/Bowel: Stomach is within normal limits. Appendix appears normal. No evidence of bowel wall thickening, distention, or inflammatory changes. Vascular/Lymphatic: Aortic atherosclerosis. No enlarged abdominal or pelvic lymph nodes. Reproductive: Prostate radiotherapy seeds are again  noted. Other: There is a small amount of free fluid in the pelvis similar to prior study. There is a small fat containing left inguinal hernia. Mild body wall edema persists. There is also a fat containing umbilical hernia. Musculoskeletal: No acute or significant osseous findings. IMPRESSION: 1. New cholecystostomy tube in place. 2. The gallbladder is  dilated. Cholelithiasis again seen. Heterogeneous hyperdensity in the gallbladder likely represents hemorrhage, unchanged. 3. Moderate right pleural effusion has increased. Small left pleural effusion is stable. 4. Small volume ascites and body wall edema are stable. 5.  Aortic Atherosclerosis (ICD10-I70.0). Electronically Signed   By: Ronney Asters M.D.   On: 03/31/2021 18:22   DG Abd Portable 1V  Result Date: 03/31/2021 CLINICAL DATA:  Emesis EXAM: PORTABLE ABDOMEN - 1 VIEW COMPARISON:  Abdominal radiograph dated March 29, 2021 FINDINGS: Nonobstructive bowel gas pattern. Surgical drain noted in the right upper quadrant. Partially visualized left lung bases clear. IMPRESSION: Nonobstructive bowel gas pattern. Electronically Signed   By: Yetta Glassman M.D.   On: 03/31/2021 10:49        Scheduled Meds:  brimonidine  1 drop Right Eye TID   carbidopa-levodopa  1 tablet Oral TID   Chlorhexidine Gluconate Cloth  6 each Topical Daily   Chlorhexidine Gluconate Cloth  6 each Topical Q0600   cinacalcet  60 mg Oral Q M,W,F-HD   darbepoetin (ARANESP) injection - DIALYSIS  40 mcg Intravenous Q Wed-HD   dorzolamide-timolol  1 drop Right Eye BID   doxercalciferol  3 mcg Intravenous Q M,W,F-HD   gabapentin  100 mg Oral Daily   heparin injection (subcutaneous)  5,000 Units Subcutaneous Q8H   insulin aspart  0-6 Units Subcutaneous Q4H   metoprolol tartrate  12.5 mg Oral BID   Netarsudil-Latanoprost  1 drop Right Eye QHS   pantoprazole  40 mg Oral Daily   pilocarpine  1 drop Right Eye QID   polyethylene glycol  17 g Oral Daily   senna  1 tablet Oral BID    sodium chloride flush  5 mL Intracatheter Q8H   Continuous Infusions:  chlorproMAZINE (THORAZINE) IV Stopped (03/30/21 2350)   fluconazole (DIFLUCAN) IV     piperacillin-tazobactam (ZOSYN)  IV 100 mL/hr at 04/01/21 0553     LOS: 4 days       Shelly Coss, MD [facility] P1/28/2023, 11:42 AM

## 2021-04-01 NOTE — Progress Notes (Signed)
Subjective: CC: Patient still complains of epigastric/luq pain/distension with constant nausea since last night. He said it started ~1 hour after eating tomato soup and has no relieved. Multiple episodes of what he describes as vomiting but when asking further sound like excess saliva that he has to spit up. He reports no RUQ pain. Passing flatus. 2 soft bm's yesterday. Denies hx of this prior to last admission.   Objective: Vital signs in last 24 hours: Temp:  [97.5 F (36.4 C)] 97.5 F (36.4 C) (01/27 1330) Pulse Rate:  [60-94] 89 (01/27 1330) Resp:  [11-22] 19 (01/27 1330) BP: (101-156)/(22-91) 113/91 (01/27 1330) SpO2:  [99 %-100 %] 99 % (01/27 1330) Weight:  [68.2 kg] 68.2 kg (01/27 1020) Last BM Date: 03/27/21  Intake/Output from previous day: 01/27 0701 - 01/28 0700 In: 110 [IV Piggyback:100] Out: 2110 [Urine:75; Drains:35] Intake/Output this shift: No intake/output data recorded.  PE: Gen:  Alert, NAD, pleasant Pulm: Rate and effort normal Abd: Soft, mild upper abdominal distension. Some ttp to the LUQ and epigastrium. No RUQ tenderness or lower abdominal tenderness. No peritonitis. +BS. Perc chole drain with dark bloody output in bag  Lab Results:  Recent Labs    03/31/21 0836 04/01/21 0154  WBC 13.7* 19.7*  HGB 9.5* 9.2*  HCT 28.6* 26.4*  PLT 506* 501*   BMET Recent Labs    03/30/21 0241 04/01/21 0154  NA 132* 134*  K 4.1 3.7  CL 95* 95*  CO2 23 23  GLUCOSE 76 104*  BUN 29* 21  CREATININE 5.55* 4.85*  CALCIUM 9.1 8.8*   PT/INR No results for input(s): LABPROT, INR in the last 72 hours. CMP     Component Value Date/Time   NA 134 (L) 04/01/2021 0154   NA 134 (A) 09/11/2019 0000   K 3.7 04/01/2021 0154   CL 95 (L) 04/01/2021 0154   CO2 23 04/01/2021 0154   GLUCOSE 104 (H) 04/01/2021 0154   BUN 21 04/01/2021 0154   BUN 34 (A) 09/11/2019 0000   CREATININE 4.85 (H) 04/01/2021 0154   CALCIUM 8.8 (L) 04/01/2021 0154   PROT 7.2 03/31/2021  1102   PROT 6.9 02/19/2018 1014   ALBUMIN 3.4 (L) 03/31/2021 1102   ALBUMIN 4.2 02/19/2018 1014   AST 20 03/31/2021 1102   ALT 22 03/31/2021 1102   ALKPHOS 133 (H) 03/31/2021 1102   BILITOT 0.4 03/31/2021 1102   BILITOT 0.5 02/19/2018 1014   GFRNONAA 11 (L) 04/01/2021 0154   GFRAA 7 (L) 12/03/2019 0710   Lipase     Component Value Date/Time   LIPASE 29 03/31/2021 1102    Studies/Results: CT ABDOMEN PELVIS WO CONTRAST  Result Date: 03/31/2021 CLINICAL DATA:  Nausea and vomiting.  On dialysis. EXAM: CT ABDOMEN AND PELVIS WITHOUT CONTRAST TECHNIQUE: Multidetector CT imaging of the abdomen and pelvis was performed following the standard protocol without IV contrast. RADIATION DOSE REDUCTION: This exam was performed according to the departmental dose-optimization program which includes automated exposure control, adjustment of the mA and/or kV according to patient size and/or use of iterative reconstruction technique. COMPARISON:  Abdominal x-ray 03/31/2021. CT abdomen and pelvis 03/23/2021. FINDINGS: Lower chest: There is a moderate-sized right pleural effusion which has mildly increased. There is compressive atelectasis of the right lower lobe. There is a small left pleural effusion which appears stable. TAVR present. Hepatobiliary: The gallbladder is dilated and thick-walled. Heterogeneous hyperdensity is again seen within the gallbladder likely related to hemorrhage. Small layering gallstones  are again noted. There is a new percutaneous cholecystostomy tube in place. There is a small amount of air in the gallbladder likely related to tube placement. There is no definite biliary ductal dilatation. No focal liver lesions are seen. Pancreas: Unremarkable. No pancreatic ductal dilatation or surrounding inflammatory changes. Spleen: Normal in size without focal abnormality. Adrenals/Urinary Tract: 2.5 x 2.1 cm left adrenal nodule is indeterminate and unchanged. Right adrenal gland within normal  limits. There is bilateral renal atrophy. No urinary tract calculi or hydronephrosis. Bladder is decompressed by Foley catheter. Stomach/Bowel: Stomach is within normal limits. Appendix appears normal. No evidence of bowel wall thickening, distention, or inflammatory changes. Vascular/Lymphatic: Aortic atherosclerosis. No enlarged abdominal or pelvic lymph nodes. Reproductive: Prostate radiotherapy seeds are again noted. Other: There is a small amount of free fluid in the pelvis similar to prior study. There is a small fat containing left inguinal hernia. Mild body wall edema persists. There is also a fat containing umbilical hernia. Musculoskeletal: No acute or significant osseous findings. IMPRESSION: 1. New cholecystostomy tube in place. 2. The gallbladder is dilated. Cholelithiasis again seen. Heterogeneous hyperdensity in the gallbladder likely represents hemorrhage, unchanged. 3. Moderate right pleural effusion has increased. Small left pleural effusion is stable. 4. Small volume ascites and body wall edema are stable. 5.  Aortic Atherosclerosis (ICD10-I70.0). Electronically Signed   By: Ronney Asters M.D.   On: 03/31/2021 18:22   DG Abd Portable 1V  Result Date: 03/31/2021 CLINICAL DATA:  Emesis EXAM: PORTABLE ABDOMEN - 1 VIEW COMPARISON:  Abdominal radiograph dated March 29, 2021 FINDINGS: Nonobstructive bowel gas pattern. Surgical drain noted in the right upper quadrant. Partially visualized left lung bases clear. IMPRESSION: Nonobstructive bowel gas pattern. Electronically Signed   By: Yetta Glassman M.D.   On: 03/31/2021 10:49    Anti-infectives: Anti-infectives (From admission, onward)    Start     Dose/Rate Route Frequency Ordered Stop   03/31/21 1200  fluconazole (DIFLUCAN) IVPB 100 mg        100 mg 50 mL/hr over 60 Minutes Intravenous Every 24 hours 03/31/21 1112     03/30/21 1245  fluconazole (DIFLUCAN) tablet 100 mg  Status:  Discontinued        100 mg Oral Daily 03/30/21 1157  03/31/21 1112   03/28/21 1645  cefOXitin (MEFOXIN) 2 g in sodium chloride 0.9 % 100 mL IVPB       Note to Pharmacy: Please send to tube station 17 radiology- for procedure   2 g 200 mL/hr over 30 Minutes Intravenous  Once 03/28/21 1550 03/28/21 1720   03/27/21 1645  vancomycin (VANCOREADY) IVPB 1500 mg/300 mL        1,500 mg 150 mL/hr over 120 Minutes Intravenous  Once 03/27/21 1632 03/27/21 1942   03/27/21 1630  piperacillin-tazobactam (ZOSYN) IVPB 2.25 g        2.25 g 100 mL/hr over 30 Minutes Intravenous Every 8 hours 03/27/21 1617          Assessment/Plan Acute Cholecystitis Nausea and vomiting  - H/o of perc chole 1/12, removed 1/19 after becoming dislodged unintentionally - HIDA +. S/p Perc chole drain 1/24. Cx's pending with currently NGTD - Recommend 10d total of abx from our standpoint. We have arranged follow up in the office.  -Called back for nausea.  CT scan done yesterday. This showed cholecystostomy tube in place. Stomach is within normal limits. No evidence of bowel wall thickening, distention, or inflammatory changes. LFT's improving from prior. Lipase wnl. Speech has  seen and cleared for d3 diet. Gb did appear dilated on CT w/ changes likely representing hemorrhage. Drain appears to be working w/ 35cc/24 hours. Flushes per IR. 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 May be worth having GI seeing. Will discuss with my attending. Further recs to follow.    FEN: D3 per speech ID: vanc 1/23. Zosyn 1/23 >  VTE: Okay for chemical prophylaxis or anticoagulation from our standpoint. 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    LOS: 4 days     Jillyn Ledger , [facility] Surgery 04/01/2021, 8:30 AM Please see Amion for pager number during day hours 7:00am-4:30pm

## 2021-04-02 ENCOUNTER — Inpatient Hospital Stay (HOSPITAL_COMMUNITY): Payer: Medicare Other

## 2021-04-02 LAB — COMPREHENSIVE METABOLIC PANEL
ALT: 6 U/L (ref 0–44)
AST: 16 U/L (ref 15–41)
Albumin: 1.8 g/dL — ABNORMAL LOW (ref 3.5–5.0)
Alkaline Phosphatase: 212 U/L — ABNORMAL HIGH (ref 38–126)
Anion gap: 15 (ref 5–15)
BUN: 25 mg/dL — ABNORMAL HIGH (ref 8–23)
CO2: 22 mmol/L (ref 22–32)
Calcium: 8.8 mg/dL — ABNORMAL LOW (ref 8.9–10.3)
Chloride: 97 mmol/L — ABNORMAL LOW (ref 98–111)
Creatinine, Ser: 5.96 mg/dL — ABNORMAL HIGH (ref 0.61–1.24)
GFR, Estimated: 9 mL/min — ABNORMAL LOW (ref >60)
Glucose, Bld: 106 mg/dL — ABNORMAL HIGH (ref 70–99)
Potassium: 3.6 mmol/L (ref 3.5–5.1)
Sodium: 134 mmol/L — ABNORMAL LOW (ref 135–145)
Total Bilirubin: 0.9 mg/dL (ref 0.3–1.2)
Total Protein: 5.6 g/dL — ABNORMAL LOW (ref 6.5–8.1)

## 2021-04-02 LAB — AEROBIC/ANAEROBIC CULTURE W GRAM STAIN (SURGICAL/DEEP WOUND): Culture: NO GROWTH

## 2021-04-02 LAB — CBC WITH DIFFERENTIAL/PLATELET
Abs Immature Granulocytes: 0.14 10*3/uL — ABNORMAL HIGH (ref 0.00–0.07)
Basophils Absolute: 0.1 10*3/uL (ref 0.0–0.1)
Basophils Relative: 0 %
Eosinophils Absolute: 0.2 10*3/uL (ref 0.0–0.5)
Eosinophils Relative: 1 %
HCT: 24.8 % — ABNORMAL LOW (ref 39.0–52.0)
Hemoglobin: 8.3 g/dL — ABNORMAL LOW (ref 13.0–17.0)
Immature Granulocytes: 1 %
Lymphocytes Relative: 4 %
Lymphs Abs: 1 10*3/uL (ref 0.7–4.0)
MCH: 34.2 pg — ABNORMAL HIGH (ref 26.0–34.0)
MCHC: 33.5 g/dL (ref 30.0–36.0)
MCV: 102.1 fL — ABNORMAL HIGH (ref 80.0–100.0)
Monocytes Absolute: 1.7 10*3/uL — ABNORMAL HIGH (ref 0.1–1.0)
Monocytes Relative: 7 %
Neutro Abs: 20.7 10*3/uL — ABNORMAL HIGH (ref 1.7–7.7)
Neutrophils Relative %: 87 %
Platelets: 466 10*3/uL — ABNORMAL HIGH (ref 150–400)
RBC: 2.43 MIL/uL — ABNORMAL LOW (ref 4.22–5.81)
RDW: 14.6 % (ref 11.5–15.5)
WBC: 23.7 10*3/uL — ABNORMAL HIGH (ref 4.0–10.5)
nRBC: 0 % (ref 0.0–0.2)

## 2021-04-02 LAB — HEPATITIS B SURFACE ANTIBODY, QUANTITATIVE: Hep B S AB Quant (Post): 7.5 m[IU]/mL — ABNORMAL LOW (ref >9.9)

## 2021-04-02 LAB — TROPONIN I (HIGH SENSITIVITY): Troponin I (High Sensitivity): 28 ng/L — ABNORMAL HIGH (ref <18)

## 2021-04-02 MED ORDER — NITROGLYCERIN 0.4 MG SL SUBL
0.4000 mg | SUBLINGUAL_TABLET | SUBLINGUAL | Status: DC | PRN
  Administered 2021-04-02 (×2): 0.4 mg via SUBLINGUAL

## 2021-04-02 MED ORDER — VANCOMYCIN HCL 750 MG/150ML IV SOLN
750.0000 mg | INTRAVENOUS | Status: DC
  Administered 2021-04-03: 750 mg via INTRAVENOUS
  Filled 2021-04-02: qty 150

## 2021-04-02 MED ORDER — FENTANYL CITRATE PF 50 MCG/ML IJ SOSY
12.5000 ug | PREFILLED_SYRINGE | Freq: Once | INTRAMUSCULAR | Status: AC
  Administered 2021-04-02: 12.5 ug via INTRAVENOUS
  Filled 2021-04-02: qty 1

## 2021-04-02 MED ORDER — IOHEXOL 350 MG/ML SOLN
100.0000 mL | Freq: Once | INTRAVENOUS | Status: AC | PRN
  Administered 2021-04-02: 100 mL via INTRAVENOUS

## 2021-04-02 MED ORDER — NITROGLYCERIN 0.4 MG SL SUBL
SUBLINGUAL_TABLET | SUBLINGUAL | Status: AC
  Filled 2021-04-02: qty 1

## 2021-04-02 MED ORDER — VANCOMYCIN HCL 750 MG/150ML IV SOLN
750.0000 mg | Freq: Once | INTRAVENOUS | Status: AC
  Administered 2021-04-02: 750 mg via INTRAVENOUS
  Filled 2021-04-02: qty 150

## 2021-04-02 MED ORDER — IOHEXOL 9 MG/ML PO SOLN: 500.0000 mL | ORAL | Status: AC

## 2021-04-02 MED ORDER — SODIUM CHLORIDE 0.9 % IV SOLN
1.0000 g | INTRAVENOUS | Status: DC
  Administered 2021-04-02: 1 g via INTRAVENOUS
  Filled 2021-04-02 (×2): qty 1

## 2021-04-02 MED ORDER — SODIUM CHLORIDE 0.9 % IV SOLN: INTRAVENOUS | Status: DC

## 2021-04-02 NOTE — Significant Event (Signed)
Rapid Response Event Note   Reason for Call : CP Initial Focused Assessment:  Notified by nursing staff regarding pt with 10/10 right sided CP with nausea and no radiation. Pt alert, oriented and follows simple commands. Denies SOB. 12 lead EKG done showing SR with no STE.  Pt was pointing near insertion site of chole drain that was placed in IR 1/24.   0033- HR 91 SR, 95/51(62), RR 22 with sats 98% on 2L [place].     Interventions:  -12 lead EKG  Plan of Care:  -Treat pain with Oxy IR prn -Treat nausea with prn Zofran -follow orders per primary MD    MD Notified: per primary RN Call Time: 0027 Arrival Time: 0030 End Time: 0050  Madelynn Done, RN

## 2021-04-02 NOTE — Progress Notes (Signed)
[facility] Progress Note   Subjective: Continued issues with N & V. Per primary. HD tomorrow on schedule.   Objective Vitals:   04/02/21 0400 04/02/21 0415 04/02/21 0500 04/02/21 0541  BP:    131/77  Pulse: 65 71    Resp:      Temp:      TempSrc:      SpO2: 96% 95%  96%  Weight:   66.7 kg   Height:       Physical Exam General: Very elderly chronically ill appearing male in NAD Heart: Normal S1 and S2; No murmurs, gallops, or rubs Lungs: Clear anteriorly and laterally Abdomen: NABS, Soft and non-tender, R percutaneous cholecystostomy  drain in place. Foley patent to gravity drain, clear yellow urine.  Extremities: L BKA no stump edema, no RLE edema Dialysis Access: L AVF +T/B        Additional Objective Labs: Basic Metabolic Panel: Recent Labs  Lab 03/28/21 0319 03/29/21 0346 04/01/21 0154 04/01/21 1148 04/02/21 0041  NA 128*   < > 134* 134* 134*  K 3.9   < > 3.7 3.9 3.6  CL 94*   < > 95* 97* 97*  CO2 25   < > 23 23 22   GLUCOSE 92   < > 104* 108* 106*  BUN 37*   < > 21 23 25*  CREATININE 6.13*   < > 4.85* 5.38* 5.96*  CALCIUM 8.2*   < > 8.8* 9.0 8.8*  PHOS 4.9*  --   --  4.9*  --    < > = values in this interval not displayed.   Liver Function Tests: Recent Labs  Lab 03/28/21 0319 03/31/21 1102 04/01/21 1148 04/02/21 0041  AST 122* 20  --  16  ALT 8 22  --  6  ALKPHOS 409* 133*  --  212*  BILITOT 0.9 0.4  --  0.9  PROT 5.3* 7.2  --  5.6*  ALBUMIN 1.7* 3.4* 2.0* 1.8*   Recent Labs  Lab 03/27/21 1143 03/31/21 1102  LIPASE 30 29   CBC: Recent Labs  Lab 03/29/21 0346 03/30/21 0241 03/31/21 0836 04/01/21 0154 04/02/21 0041  WBC 14.4* 17.1* 13.7* 19.7* 23.7*  NEUTROABS 12.0* 14.7* 11.0* 16.9* 20.7*  HGB 8.4* 8.8* 9.5* 9.2* 8.3*  HCT 25.1* 25.7* 28.6* 26.4* 24.8*  MCV 100.8* 101.6* 101.1* 100.0 102.1*  PLT 427* 453* 506* 501* 466*   Blood Culture    Component Value Date/Time   SDES PLEURAL FLUID 04/01/2021 1337    SPECREQUEST LUNG RIGHT LOWER LOBE 04/01/2021 1337   CULT PENDING 04/01/2021 1337   REPTSTATUS PENDING 04/01/2021 1337    Cardiac Enzymes: No results for input(s): CKTOTAL, CKMB, CKMBINDEX, TROPONINI in the last 168 hours. CBG: Recent Labs  Lab 03/31/21 1438 04/01/21 0821 04/01/21 1200 04/01/21 1552 04/01/21 2048  GLUCAP 102* 96 101* 104* 109*   Iron Studies: No results for input(s): IRON, TIBC, TRANSFERRIN, FERRITIN in the last 72 hours. @lablastinr3 @ Studies/Results: CT ABDOMEN PELVIS WO CONTRAST  Result Date: 03/31/2021 CLINICAL DATA:  Nausea and vomiting.  On dialysis. EXAM: CT ABDOMEN AND PELVIS WITHOUT CONTRAST TECHNIQUE: Multidetector CT imaging of the abdomen and pelvis was performed following the standard protocol without IV contrast. RADIATION DOSE REDUCTION: This exam was performed according to the departmental dose-optimization program which includes automated exposure control, adjustment of the mA and/or kV according to patient size and/or use of iterative reconstruction technique. COMPARISON:  Abdominal x-ray 03/31/2021. CT abdomen and pelvis 03/23/2021. FINDINGS: Lower  chest: There is a moderate-sized right pleural effusion which has mildly increased. There is compressive atelectasis of the right lower lobe. There is a small left pleural effusion which appears stable. TAVR present. Hepatobiliary: The gallbladder is dilated and thick-walled. Heterogeneous hyperdensity is again seen within the gallbladder likely related to hemorrhage. Small layering gallstones are again noted. There is a new percutaneous cholecystostomy tube in place. There is a small amount of air in the gallbladder likely related to tube placement. There is no definite biliary ductal dilatation. No focal liver lesions are seen. Pancreas: Unremarkable. No pancreatic ductal dilatation or surrounding inflammatory changes. Spleen: Normal in size without focal abnormality. Adrenals/Urinary Tract: 2.5 x 2.1 cm left  adrenal nodule is indeterminate and unchanged. Right adrenal gland within normal limits. There is bilateral renal atrophy. No urinary tract calculi or hydronephrosis. Bladder is decompressed by Foley catheter. Stomach/Bowel: Stomach is within normal limits. Appendix appears normal. No evidence of bowel wall thickening, distention, or inflammatory changes. Vascular/Lymphatic: Aortic atherosclerosis. No enlarged abdominal or pelvic lymph nodes. Reproductive: Prostate radiotherapy seeds are again noted. Other: There is a small amount of free fluid in the pelvis similar to prior study. There is a small fat containing left inguinal hernia. Mild body wall edema persists. There is also a fat containing umbilical hernia. Musculoskeletal: No acute or significant osseous findings. IMPRESSION: 1. New cholecystostomy tube in place. 2. The gallbladder is dilated. Cholelithiasis again seen. Heterogeneous hyperdensity in the gallbladder likely represents hemorrhage, unchanged. 3. Moderate right pleural effusion has increased. Small left pleural effusion is stable. 4. Small volume ascites and body wall edema are stable. 5.  Aortic Atherosclerosis (ICD10-I70.0). Electronically Signed   By: Ronney Asters M.D.   On: 03/31/2021 18:22   DG CHEST PORT 1 VIEW  Result Date: 04/02/2021 CLINICAL DATA:  Shortness of breath.  Follow-up exam. EXAM: PORTABLE CHEST 1 VIEW COMPARISON:  03/24/2021 and older studies. FINDINGS: Patchy airspace opacity has increased at the right lung base, extending to the right mid lung. Mild linear atelectasis or scarring is noted at the medial left lung base, stable. Remainder of the lungs is clear. Cardiac silhouette normal in size. Stable endovascular aortic valve replacement. No pneumothorax. Stable pigtail catheter in the right upper quadrant. IMPRESSION: 1. Interval increase in airspace opacity at the right lung base from the previous day's exam, which may reflect reaccumulation of fluid and associated  atelectasis, with infection also in the differential diagnosis. 2. No other change.  No pneumothorax. Electronically Signed   By: Lajean Manes M.D.   On: 04/02/2021 08:30   DG Chest Port 1 View  Result Date: 04/01/2021 CLINICAL DATA:  Status post thoracentesis.  Right pleural effusion. EXAM: PORTABLE CHEST 1 VIEW COMPARISON:  03/27/2021. FINDINGS: There is decreased opacity at the right lung base with most of the right hemidiaphragm now defined. Residual opacity at the right base is consistent with atelectasis, likely with a small residual pleural effusion. Remainder of the lungs is clear. No pneumothorax. A pigtail catheter projects in the right upper quadrant. Cardiac silhouette is normal in size. IMPRESSION: 1. Decreased right lung base opacity following thoracentesis. Residual opacity consistent with atelectasis and a small effusion. 2. Remainder of the lungs is clear. 3. No pneumothorax. Electronically Signed   By: Lajean Manes M.D.   On: 04/01/2021 13:37   US THORACENTESIS ASP PLEURAL SPACE W/IMG GUIDE  Result Date: 04/01/2021 INDICATION: Patient with history of end-stage renal disease, AFib, acute cholecystitis status post percutaneous cholecystostomy placement now with worsening  bilateral pleural effusions. Request to IR for diagnostic and therapeutic right thoracentesis EXAM: ULTRASOUND GUIDED RIGHT THORACENTESIS MEDICATIONS: 7 mL 1% lidocaine COMPLICATIONS: None immediate. PROCEDURE: An ultrasound guided thoracentesis was thoroughly discussed with the patient and questions answered. The benefits, risks, alternatives and complications were also discussed. The patient understands and wishes to proceed with the procedure. Written consent was obtained. Ultrasound was performed to localize and mark an adequate pocket of fluid in the RIGHT chest. The area was then prepped and draped in the normal sterile fashion. 1% Lidocaine was used for local anesthesia. Under ultrasound guidance a 6 Fr  Safe-T-Centesis catheter was introduced. Thoracentesis was performed. The catheter was removed and a dressing applied. FINDINGS: A total of approximately 1.1 L of dark red fluid was removed. Samples were sent to the laboratory as requested by the clinical team. IMPRESSION: Successful ultrasound guided RIGHT thoracentesis yielding 1.1 L of pleural fluid. Read by Candiss Norse, PA-C Electronically Signed   By: Michaelle Birks M.D.   On: 04/01/2021 19:20   Medications:  sodium chloride     sodium chloride     sodium chloride 10 mL/hr at 04/02/21 0057   chlorproMAZINE (THORAZINE) IV Stopped (03/30/21 2350)   fluconazole (DIFLUCAN) IV     vancomycin 750 mg (04/02/21 1058)   [START ON 04/03/2021] vancomycin      brimonidine  1 drop Right Eye TID   carbidopa-levodopa  1 tablet Oral TID   Chlorhexidine Gluconate Cloth  6 each Topical Daily   Chlorhexidine Gluconate Cloth  6 each Topical Q0600   cinacalcet  60 mg Oral Q M,W,F-HD   darbepoetin (ARANESP) injection - DIALYSIS  40 mcg Intravenous Q Wed-HD   dorzolamide-timolol  1 drop Right Eye BID   doxercalciferol  3 mcg Intravenous Q M,W,F-HD   gabapentin  100 mg Oral Daily   heparin injection (subcutaneous)  5,000 Units Subcutaneous Q8H   metoprolol tartrate  12.5 mg Oral BID   Netarsudil-Latanoprost  1 drop Right Eye QHS   nitroGLYCERIN       pantoprazole  40 mg Oral Daily   pilocarpine  1 drop Right Eye QID   polyethylene glycol  17 g Oral Daily   senna  1 tablet Oral BID   sodium chloride flush  5 mL Intracatheter Q8H     Dialysis Orders: [place] MWF  4h  400/600  66kg 2/2 bath  P2  AVF  Hep none  - sensipar 60 mg po tiw  - hectorol 3 ug tiw IV  - mircera 75 ug q 2 wks, just ordered   Assessment/Plan: Abdominal pain: recent admit for acute cholecystitis, S/P perc chole tube 1/12- 1/17 per IR, dc'd on 1/22. Readmitted now w/ abd pain again and is SP new perc chole tube by IR 1/24. Repeating [facility]. Per primary/Gen Surgery.  Atrial fib -  recent onset, per primary ESRD - on HD MWF. Next HD 04/03/2021  BP/volume  - No evidence of volume overload by exam. Net UF 2 Liters with HD 03/31/2021. No post wt. Daily wts ordered. BP controlled. UF as tolerated.   Anemia ckd  - Hb 9.2 started ESA last admit darbe 40 weekly, will continue.  Metabolic bone disease -  changed auryxia to Renvela binders 2 ac last admit, phos 4.9. Continue sensipar and vdra. Ca in range.  Nutrition - Albumin <2. Protein supps ordered. Dys 3 diet added fluid restrictions Parkinson's disease-per primary DMT2-per primary H/O TAVR DNR  Elijah Letts H. Kyleena Scheirer NP-C 04/02/2021, 11:22 AM  [facility] [phone]

## 2021-04-02 NOTE — Progress Notes (Signed)
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  She was recently admitted from 1/11-03/2020 for acute cholecystitis managed with percutaneous cholecystectomy tube which was unintentionally dislodged and subsequently removed on 1/19 and was discharged with oral antibiotics.  After being discharged to nursing facility, he developed severe abdominal pain with associated nausea and vomiting.  On presentation ,he was afebrile, lab work showed worsening leukocytosis.  Right upper quadrant ultrasound showed distended gallbladder with cholelithiasis, sludge, pericholecystic fluid.  Murphy sign was positive.  IR, general surgery consulted.  Underwent cholecystostomy tube placement by IR on 1/24.  Cultures have not shown any growth.  Hospital course remarkable for persistent nausea, vomiting. General surgery reconsulted and following  Assessment & Plan:   Principal Problem:   Acute cholecystitis Active Problems:   Type 2 diabetes mellitus with renal manifestations (HCC)   Essential hypertension   HLD (hyperlipidemia)   ESRD (end stage renal disease) (HCC)   Macrocytic anemia   RBBB   Blindness   S/P TAVR (transcatheter aortic valve replacement)   Anemia in chronic kidney disease   Secondary hyperparathyroidism of renal origin ([place])   PAD (peripheral artery disease) ([place])   DNR (do not resuscitate)   Pressure injury of skin   Acute cholecystitis: He was recently admitted from 1/11-03/2020 for acute cholecystitis managed with percutaneous cholecystectomy  tube which was unintentionally dislodged and subsequently removed on 1/19 and was discharged with oral antibiotics.  After being discharged to nursing facility, he developed severe abdominal pain with associated nausea and vomiting.  On presentation she was afebrile, lab work showed worsening leukocytosis.  Right upper quadrant ultrasound showed distended gallbladder with cholelithiasis, sludge, pericholecystic fluid. Murphy sign was positive. IR, general surgery consulted.  Underwent cholecystostomy tube placement by IR on 1/24.   Continue pain management supportive care.  Blood cultures no growth till date.  Aerobic/anaerobic culture have not shown any growth, no organisms.continue current antibiotics.  Plan for total of 10 days duration of antibiotics. Patient has been having consistent nausea/vomiting since last 2-3 days.   X-ray of the abdomen done here did not show any bowel obstruction.  CT abdomen/pelvis done on 1/27 showed dilated gallbladder.  He continues to complain of nausea and abdominal pain which is not exactly on the right upper quadrant.  General surgery continue to recommend against surgical intervention, continue right upper quadrant drain. Elevated white cell count.Blood cultures have been negative.We will resend blood cultures.  Antibiotics will be broadened.  White cell count went up.  Currently on vancomycin.  Requesting surgery to start on ertapenem.  Will check CT abdomen/pelvis with contrast He is having bowel movements  ESRD: On dialysis on MWF.  Nephrology following for dialysis  Bilateral pleural effusion: Moderate to large RIGHT and small LEFT pleural effusions with associated RIGHT basilar atelectasis as per CXR.   No respiratory symptoms.  Currently on room air.   Volume management with dialysis.  Underwent right sided  ultrasound-guided thoracentesis with removal of 1.1 L of dark red fluid.no organisms seen on the pleural fluid.,  Cultures pending.  We will follow cytology.   Follow-up  chest x-ray done today showed right lower opacity suspicious for pneumonia versus residual pleural effusion.  Chest pain :complaint of chest pain today, early this morning.  Denies any chest pain during my evaluation.  Chest x-ray as above.  Troponin mildly elevated at 28 likely secondary to demand ischemia from ESRD  Paroxysmal A. fib: Currently in normal sinus rhythm.  Not on anticoagulation due to concern for GI bleed.  On low-dose metoprolol for rate control.  Monitor on telemetry  Non-insulin-dependent diabetes type 2: Recent A1c of 7%.  Monitor blood sugars.  Currently on sliding scale insulin.  On gabapentin for diabetic neuropathy.  History of glaucoma: Continue eyedrops  Parkinson's disease: Continue Sinemet.  Delirium precautions  History of peripheral vascular disease : status post left BKA.  Takes Plavix , continued  History of urinary retention: On last admission Foley was placed and he was discharged with Foley.  Continue Foley catheter for now.  We recommend to follow-up with urology as an outpatient  Macrocytic anemia/esophageal candidiasis: Associated with ESRD.  No evidence of bleeding.  Hemoglobin stable.  He underwent EGD on last admission.  Biopsies showed candidiasis.  He has been started on 7 days course of fluconazole.  Will reconsult gastroenterology tomorrow if he continues to have nausea and vomiting/after following up with CT scan report.  Hyponatremia: Likely from volume overload.  Continue monitoring.  Volume management as per dialysis  Hiccups: Continue Thorazine as needed          DVT prophylaxis:Heparin [place] Code Status: DNR Family Communication:: Discussed with son at bedside on 1/29 Patient status: Inpatient  Dispo: The patient is from: Skilled nursing facility              Anticipated d/c is to: Skilled nursing facility              Anticipated d/c date is: Likely  next week, after resolution of abdominal pain, nausea Consultants: IR,  general surgery, nephrology  Procedures: None  Antimicrobials:  Anti-infectives (From admission, onward)    Start     Dose/Rate Route Frequency Ordered Stop   04/03/21 1200  vancomycin (VANCOREADY) IVPB 750 mg/150 mL        750 mg 150 mL/hr over 60 Minutes Intravenous Every M-W-F (Hemodialysis) 04/02/21 0934     04/02/21 1800  fluconazole (DIFLUCAN) IVPB 100 mg        100 mg 50 mL/hr over 60 Minutes Intravenous Every 24 hours 04/01/21 1504     04/02/21 0945  vancomycin (VANCOREADY) IVPB 750 mg/150 mL        750 mg 150 mL/hr over 60 Minutes Intravenous  Once 04/02/21 0853     03/31/21 1200  fluconazole (DIFLUCAN) IVPB 100 mg  Status:  Discontinued        100 mg 50 mL/hr over 60 Minutes Intravenous Every 24 hours 03/31/21 1112 04/01/21 1504   03/30/21 1245  fluconazole (DIFLUCAN) tablet 100 mg  Status:  Discontinued        100 mg Oral Daily 03/30/21 1157 03/31/21 1112   03/28/21 1645  cefOXitin (MEFOXIN) 2 g in sodium chloride 0.9 % 100 mL IVPB       Note to Pharmacy: Please send to tube station 17 radiology- for procedure   2 g 200 mL/hr over 30 Minutes Intravenous  Once 03/28/21 1550 03/28/21 1720   03/27/21 1645  vancomycin (VANCOREADY) IVPB 1500 mg/300 mL        1,500 mg 150 mL/hr over 120 Minutes Intravenous  Once  03/27/21 1632 03/27/21 1942   03/27/21 1630  piperacillin-tazobactam (ZOSYN) IVPB 2.25 g  Status:  Discontinued        2.25 g 100 mL/hr over 30 Minutes Intravenous Every 8 hours 03/27/21 1617 04/02/21 0829       Subjective: Patient seen and examined at the bedside this morning.  Hemodynamically stable.  Had chest pain earlier today but not during my evaluation.  He was having nausea and about to vomit.  Continues to complain of vague abdominal discomfort.  Objective: Vitals:   04/02/21 0400 04/02/21 0415 04/02/21 0500 04/02/21 0541  BP:    131/77  Pulse: 65 71    Resp:      Temp:      TempSrc:      SpO2: 96% 95%  96%  Weight:   66.7 kg   Height:         Intake/Output Summary (Last 24 hours) at 04/02/2021 1102 Last data filed at 04/02/2021 4098 Gross per 24 hour  Intake 100 ml  Output 15 ml  Net 85 ml   Filed Weights   03/29/21 1109 03/31/21 1020 04/02/21 0500  Weight: 68.2 kg 68.2 kg 66.7 kg    Examination:   General exam: Very deconditioned, chronically ill looking HEENT: Legally blind Respiratory system:  no wheezes or crackles  Cardiovascular system: S1 & S2 heard, RRR.  Gastrointestinal system: Abdomen is nondistended, soft .  Has generalized tenderness.  Right upper quadrant drain with sanguinous output Central nervous system: Alert and oriented Extremities: Left BKA, AV fistula on the left upper extremity Skin: No rashes, no ulcers,no icterus    Data Reviewed: I have personally reviewed following labs and imaging studies  CBC: Recent Labs  Lab 03/29/21 0346 03/30/21 0241 03/31/21 0836 04/01/21 0154 04/02/21 0041  WBC 14.4* 17.1* 13.7* 19.7* 23.7*  NEUTROABS 12.0* 14.7* 11.0* 16.9* 20.7*  HGB 8.4* 8.8* 9.5* 9.2* 8.3*  HCT 25.1* 25.7* 28.6* 26.4* 24.8*  MCV 100.8* 101.6* 101.1* 100.0 102.1*  PLT 427* 453* 506* 501* 119*   Basic Metabolic Panel: Recent Labs  Lab 03/28/21 0319 03/29/21 0346 03/30/21 0241 04/01/21 0154 04/01/21 1148 04/02/21 0041  NA 128* 128* 132* 134* 134* 134*  K 3.9 4.2 4.1 3.7 3.9 3.6  CL 94* 95* 95* 95* 97* 97*  CO2 25 21* 23 23 23 22   GLUCOSE 92 84 76 104* 108* 106*  BUN 37* 43* 29* 21 23 25*  CREATININE 6.13* 7.04* 5.55* 4.85* 5.38* 5.96*  CALCIUM 8.2* 8.3* 9.1 8.8* 9.0 8.8*  PHOS 4.9*  --   --   --  4.9*  --    GFR: Estimated Creatinine Clearance: 8.3 mL/min (A) (by C-G formula based on SCr of 5.96 mg/dL (H)). Liver Function Tests: Recent Labs  Lab 03/27/21 1143 03/28/21 0319 03/31/21 1102 04/01/21 1148 04/02/21 0041  AST 35 122* 20  --  16  ALT 6 8 22   --  6  ALKPHOS 191* 409* 133*  --  212*  BILITOT 1.0 0.9 0.4  --  0.9  PROT 6.8 5.3* 7.2  --  5.6*  ALBUMIN  2.4* 1.7* 3.4* 2.0* 1.8*   Recent Labs  Lab 03/27/21 1143 03/31/21 1102  LIPASE 30 29   No results for input(s): AMMONIA in the last 168 hours. Coagulation Profile: Recent Labs  Lab 03/28/21 0319  INR 1.2   Cardiac Enzymes: No results for input(s): CKTOTAL, CKMB, CKMBINDEX, TROPONINI in the last 168 hours. BNP (last 3 results) No results for input(s):  PROBNP in the last 8760 hours. HbA1C: No results for input(s): HGBA1C in the last 72 hours. CBG: Recent Labs  Lab 03/31/21 1438 04/01/21 0821 04/01/21 1200 04/01/21 1552 04/01/21 2048  GLUCAP 102* 96 101* 104* 109*   Lipid Profile: No results for input(s): CHOL, HDL, LDLCALC, TRIG, CHOLHDL, LDLDIRECT in the last 72 hours. Thyroid Function Tests: No results for input(s): TSH, T4TOTAL, FREET4, T3FREE, THYROIDAB in the last 72 hours. Anemia Panel: No results for input(s): VITAMINB12, FOLATE, FERRITIN, TIBC, IRON, RETICCTPCT in the last 72 hours. Sepsis Labs: No results for input(s): PROCALCITON, LATICACIDVEN in the last 168 hours.  Recent Results (from the past 240 hour(s))  Resp Panel by RT-PCR (Flu A&B, Covid) Nasopharyngeal Swab     Status: None   Collection Time: 03/26/21 11:56 AM   Specimen: Nasopharyngeal Swab; Nasopharyngeal(NP) swabs in vial transport medium  Result Value Ref Range Status   SARS Coronavirus 2 by RT PCR NEGATIVE NEGATIVE Final    Comment: (NOTE) SARS-CoV-2 target nucleic acids are NOT DETECTED.  The SARS-CoV-2 RNA is generally detectable in upper respiratory specimens during the acute phase of infection. The lowest concentration of SARS-CoV-2 viral copies this assay can detect is 138 copies/mL. A negative result does not preclude SARS-Cov-2 infection and should not be used as the sole basis for treatment or other patient management decisions. A negative result may occur with  improper specimen collection/handling, submission of specimen other than nasopharyngeal swab, presence of viral  mutation(s) within the areas targeted by this assay, and inadequate number of viral copies(<138 copies/mL). A negative result must be combined with clinical observations, patient history, and epidemiological information. The expected result is Negative.  Fact Sheet for Patients:  EntrepreneurPulse.com.au  Fact Sheet for Healthcare Providers:  IncredibleEmployment.be  This test is no t yet approved or cleared by the [place] FDA and  has been authorized for detection and/or diagnosis of SARS-CoV-2 by FDA under an Emergency Use Authorization (EUA). This EUA will remain  in effect (meaning this test can be used) for the duration of the COVID-19 declaration under Section 564(b)(1) of the Act, 21 U.S.C.section 360bbb-3(b)(1), unless the authorization is terminated  or revoked sooner.       Influenza A by PCR NEGATIVE NEGATIVE Final   Influenza B by PCR NEGATIVE NEGATIVE Final    Comment: (NOTE) The Xpert Xpress SARS-CoV-2/FLU/RSV plus assay is intended as an aid in the diagnosis of influenza from Nasopharyngeal swab specimens and should not be used as a sole basis for treatment. Nasal washings and aspirates are unacceptable for Xpert Xpress SARS-CoV-2/FLU/RSV testing.  Fact Sheet for Patients: EntrepreneurPulse.com.au  Fact Sheet for Healthcare Providers: IncredibleEmployment.be  This test is not yet approved or cleared by the [place] FDA and has been authorized for detection and/or diagnosis of SARS-CoV-2 by FDA under an Emergency Use Authorization (EUA). This EUA will remain in effect (meaning this test can be used) for the duration of the COVID-19 declaration under Section 564(b)(1) of the Act, 21 U.S.C. section 360bbb-3(b)(1), unless the authorization is terminated or revoked.  Performed at [facility], [place] [address], [place], [place] [address]   Resp Panel by RT-PCR (Flu A&B,  Covid) Nasopharyngeal Swab     Status: None   Collection Time: 03/27/21  2:10 PM   Specimen: Nasopharyngeal Swab; Nasopharyngeal(NP) swabs in vial transport medium  Result Value Ref Range Status   SARS Coronavirus 2 by RT PCR NEGATIVE NEGATIVE Final    Comment: (NOTE) SARS-CoV-2 target nucleic acids  are NOT DETECTED.  The SARS-CoV-2 RNA is generally detectable in upper respiratory specimens during the acute phase of infection. The lowest concentration of SARS-CoV-2 viral copies this assay can detect is 138 copies/mL. A negative result does not preclude SARS-Cov-2 infection and should not be used as the sole basis for treatment or other patient management decisions. A negative result may occur with  improper specimen collection/handling, submission of specimen other than nasopharyngeal swab, presence of viral mutation(s) within the areas targeted by this assay, and inadequate number of viral copies(<138 copies/mL). A negative result must be combined with clinical observations, patient history, and epidemiological information. The expected result is Negative.  Fact Sheet for Patients:  EntrepreneurPulse.com.au  Fact Sheet for Healthcare Providers:  IncredibleEmployment.be  This test is no t yet approved or cleared by the [place] FDA and  has been authorized for detection and/or diagnosis of SARS-CoV-2 by FDA under an Emergency Use Authorization (EUA). This EUA will remain  in effect (meaning this test can be used) for the duration of the COVID-19 declaration under Section 564(b)(1) of the Act, 21 U.S.C.section 360bbb-3(b)(1), unless the authorization is terminated  or revoked sooner.       Influenza A by PCR NEGATIVE NEGATIVE Final   Influenza B by PCR NEGATIVE NEGATIVE Final    Comment: (NOTE) The Xpert Xpress SARS-CoV-2/FLU/RSV plus assay is intended as an aid in the diagnosis of influenza from Nasopharyngeal swab specimens and should  not be used as a sole basis for treatment. Nasal washings and aspirates are unacceptable for Xpert Xpress SARS-CoV-2/FLU/RSV testing.  Fact Sheet for Patients: EntrepreneurPulse.com.au  Fact Sheet for Healthcare Providers: IncredibleEmployment.be  This test is not yet approved or cleared by the [place] FDA and has been authorized for detection and/or diagnosis of SARS-CoV-2 by FDA under an Emergency Use Authorization (EUA). This EUA will remain in effect (meaning this test can be used) for the duration of the COVID-19 declaration under Section 564(b)(1) of the Act, 21 U.S.C. section 360bbb-3(b)(1), unless the authorization is terminated or revoked.  Performed at [facility], [place] [address], [place], [place] [address]   Culture, blood (routine x 2)     Status: None   Collection Time: 03/27/21  4:03 PM   Specimen: BLOOD  Result Value Ref Range Status   Specimen Description BLOOD BLOOD RIGHT ARM  Final   Special Requests   Final    BOTTLES DRAWN AEROBIC AND ANAEROBIC Blood Culture results may not be optimal due to an excessive volume of blood received in culture bottles   Culture   Final    NO GROWTH 5 DAYS Performed at [facility], [place] [address], [place], [place] [address]    Report Status 04/01/2021 FINAL  Final  Culture, blood (routine x 2)     Status: None   Collection Time: 03/27/21  6:49 PM   Specimen: BLOOD RIGHT HAND  Result Value Ref Range Status   Specimen Description BLOOD RIGHT HAND  Final   Special Requests   Final    BOTTLES DRAWN AEROBIC AND ANAEROBIC Blood Culture adequate volume   Culture   Final    NO GROWTH 5 DAYS Performed at [facility], [place] [address], [place], [place] [address]    Report Status 04/01/2021 FINAL  Final  Aerobic/Anaerobic Culture w Gram Stain (surgical/deep wound)     Status: None (Preliminary result)   Collection Time: 03/28/21  5:10 PM   Specimen: Abscess  Result Value  Ref Range Status   Specimen Description ABSCESS  Final   Special Requests GALLBLADDER  Final   Gram Stain   Final    FEW WBC PRESENT,BOTH PMN AND MONONUCLEAR NO ORGANISMS SEEN    Culture   Final    NO GROWTH 4 DAYS NO ANAEROBES ISOLATED; CULTURE IN PROGRESS FOR 5 DAYS Performed at [facility], [address], [place], [place] [address]    Report Status PENDING  Incomplete  Body fluid culture w Gram Stain     Status: None (Preliminary result)   Collection Time: 04/01/21  1:37 PM   Specimen: Lung, Right Lower Lobe; Pleural Fluid  Result Value Ref Range Status   Specimen Description PLEURAL FLUID  Final   Special Requests LUNG RIGHT LOWER LOBE  Final   Gram Stain   Final    RARE WBC PRESENT,BOTH PMN AND MONONUCLEAR NO ORGANISMS SEEN Performed at [facility], [place] [address], [place], [place] [address]    Culture PENDING  Incomplete   Report Status PENDING  Incomplete         Radiology Studies: CT ABDOMEN PELVIS WO CONTRAST  Result Date: 03/31/2021 CLINICAL DATA:  Nausea and vomiting.  On dialysis. EXAM: CT ABDOMEN AND PELVIS WITHOUT CONTRAST TECHNIQUE: Multidetector CT imaging of the abdomen and pelvis was performed following the standard protocol without IV contrast. RADIATION DOSE REDUCTION: This exam was performed according to the departmental dose-optimization program which includes automated exposure control, adjustment of the mA and/or kV according to patient size and/or use of iterative reconstruction technique. COMPARISON:  Abdominal x-ray 03/31/2021. CT abdomen and pelvis 03/23/2021. FINDINGS: Lower chest: There is a moderate-sized right pleural effusion which has mildly increased. There is compressive atelectasis of the right lower lobe. There is a small left pleural effusion which appears stable. TAVR present. Hepatobiliary: The gallbladder is dilated and thick-walled. Heterogeneous hyperdensity is again seen within the gallbladder likely related to hemorrhage.  Small layering gallstones are again noted. There is a new percutaneous cholecystostomy tube in place. There is a small amount of air in the gallbladder likely related to tube placement. There is no definite biliary ductal dilatation. No focal liver lesions are seen. Pancreas: Unremarkable. No pancreatic ductal dilatation or surrounding inflammatory changes. Spleen: Normal in size without focal abnormality. Adrenals/Urinary Tract: 2.5 x 2.1 cm left adrenal nodule is indeterminate and unchanged. Right adrenal gland within normal limits. There is bilateral renal atrophy. No urinary tract calculi or hydronephrosis. Bladder is decompressed by Foley catheter. Stomach/Bowel: Stomach is within normal limits. Appendix appears normal. No evidence of bowel wall thickening, distention, or inflammatory changes. Vascular/Lymphatic: Aortic atherosclerosis. No enlarged abdominal or pelvic lymph nodes. Reproductive: Prostate radiotherapy seeds are again noted. Other: There is a small amount of free fluid in the pelvis similar to prior study. There is a small fat containing left inguinal hernia. Mild body wall edema persists. There is also a fat containing umbilical hernia. Musculoskeletal: No acute or significant osseous findings. IMPRESSION: 1. New cholecystostomy tube in place. 2. The gallbladder is dilated. Cholelithiasis again seen. Heterogeneous hyperdensity in the gallbladder likely represents hemorrhage, unchanged. 3. Moderate right pleural effusion has increased. Small left pleural effusion is stable. 4. Small volume ascites and body wall edema are stable. 5.  Aortic Atherosclerosis (ICD10-I70.0). Electronically Signed   By: Ronney Asters M.D.   On: 03/31/2021 18:22   DG CHEST PORT 1 VIEW  Result Date: 04/02/2021 CLINICAL DATA:  Shortness of breath.  Follow-up exam. EXAM: PORTABLE CHEST  1 VIEW COMPARISON:  03/24/2021 and older studies. FINDINGS: Patchy airspace opacity has increased at the right lung base, extending to  the right mid lung. Mild linear atelectasis or scarring is noted at the medial left lung base, stable. Remainder of the lungs is clear. Cardiac silhouette normal in size. Stable endovascular aortic valve replacement. No pneumothorax. Stable pigtail catheter in the right upper quadrant. IMPRESSION: 1. Interval increase in airspace opacity at the right lung base from the previous day's exam, which may reflect reaccumulation of fluid and associated atelectasis, with infection also in the differential diagnosis. 2. No other change.  No pneumothorax. Electronically Signed   By: Lajean Manes M.D.   On: 04/02/2021 08:30   DG Chest Port 1 View  Result Date: 04/01/2021 CLINICAL DATA:  Status post thoracentesis.  Right pleural effusion. EXAM: PORTABLE CHEST 1 VIEW COMPARISON:  03/27/2021. FINDINGS: There is decreased opacity at the right lung base with most of the right hemidiaphragm now defined. Residual opacity at the right base is consistent with atelectasis, likely with a small residual pleural effusion. Remainder of the lungs is clear. No pneumothorax. A pigtail catheter projects in the right upper quadrant. Cardiac silhouette is normal in size. IMPRESSION: 1. Decreased right lung base opacity following thoracentesis. Residual opacity consistent with atelectasis and a small effusion. 2. Remainder of the lungs is clear. 3. No pneumothorax. Electronically Signed   By: Lajean Manes M.D.   On: 04/01/2021 13:37   US THORACENTESIS ASP PLEURAL SPACE W/IMG GUIDE  Result Date: 04/01/2021 INDICATION: Patient with history of end-stage renal disease, AFib, acute cholecystitis status post percutaneous cholecystostomy placement now with worsening bilateral pleural effusions. Request to IR for diagnostic and therapeutic right thoracentesis EXAM: ULTRASOUND GUIDED RIGHT THORACENTESIS MEDICATIONS: 7 mL 1% lidocaine COMPLICATIONS: None immediate. PROCEDURE: An ultrasound guided thoracentesis was thoroughly discussed with the  patient and questions answered. The benefits, risks, alternatives and complications were also discussed. The patient understands and wishes to proceed with the procedure. Written consent was obtained. Ultrasound was performed to localize and mark an adequate pocket of fluid in the RIGHT chest. The area was then prepped and draped in the normal sterile fashion. 1% Lidocaine was used for local anesthesia. Under ultrasound guidance a 6 Fr Safe-T-Centesis catheter was introduced. Thoracentesis was performed. The catheter was removed and a dressing applied. FINDINGS: A total of approximately 1.1 L of dark red fluid was removed. Samples were sent to the laboratory as requested by the clinical team. IMPRESSION: Successful ultrasound guided RIGHT thoracentesis yielding 1.1 L of pleural fluid. Read by Candiss Norse, PA-C Electronically Signed   By: Michaelle Birks M.D.   On: 04/01/2021 19:20        Scheduled Meds:  brimonidine  1 drop Right Eye TID   carbidopa-levodopa  1 tablet Oral TID   Chlorhexidine Gluconate Cloth  6 each Topical Daily   Chlorhexidine Gluconate Cloth  6 each Topical Q0600   cinacalcet  60 mg Oral Q M,W,F-HD   darbepoetin (ARANESP) injection - DIALYSIS  40 mcg Intravenous Q Wed-HD   dorzolamide-timolol  1 drop Right Eye BID   doxercalciferol  3 mcg Intravenous Q M,W,F-HD   gabapentin  100 mg Oral Daily   heparin injection (subcutaneous)  5,000 Units Subcutaneous Q8H   metoprolol tartrate  12.5 mg Oral BID   Netarsudil-Latanoprost  1 drop Right Eye QHS   nitroGLYCERIN       pantoprazole  40 mg Oral Daily   pilocarpine  1 drop Right Eye  QID   polyethylene glycol  17 g Oral Daily   senna  1 tablet Oral BID   sodium chloride flush  5 mL Intracatheter Q8H   Continuous Infusions:  sodium chloride     sodium chloride     sodium chloride 10 mL/hr at 04/02/21 0057   chlorproMAZINE (THORAZINE) IV Stopped (03/30/21 2350)   fluconazole (DIFLUCAN) IV     vancomycin     [START ON  04/03/2021] vancomycin       LOS: 5 days       Shelly Coss, MD [facility] P1/29/2023, 11:02 AM

## 2021-04-02 NOTE — Progress Notes (Signed)
Troponin level is 28. Patient is asymptomatic and he's sleeping. Notified Blount NP without any new order.

## 2021-04-02 NOTE — Progress Notes (Addendum)
Subjective: CC: Patient s/p R thoracentesis yesterday w/ 1.1L of dark red fluid aspirated.  Patient reports overnight his pain has become more right sided located in the RUQ and lower part of his chest. Still with constant nausea. He denies emesis but reports he constantly has to spit up. Reports at least 2-3 loose bm's yesterday.   BP did drop overnight, this seems to have occurred per notes after NTG x 2.  Afebrile overnight without tachycardia. BP has now improved. On 2L.  IR perc chole with 15cc/24 hours.  AST/ALT/T. Bili wnl. Alk phos 212, which is up from 133 yesterday.  WBC 23.7 this am [facility] reports they have ordered a contrast CT scan   Objective: Vital signs in last 24 hours: Temp:  [97.9 F (36.6 C)] 97.9 F (36.6 C) (01/28 2210) Pulse Rate:  [65-91] 71 (01/29 0415) BP: (95-137)/(51-77) 131/77 (01/29 0541) SpO2:  [95 %-100 %] 96 % (01/29 0541) Weight:  [66.7 kg] 66.7 kg (01/29 0500) Last BM Date: 04/02/21  Intake/Output from previous day: 01/28 0701 - 01/29 0700 In: 100 [IV Piggyback:100] Out: 15 [Drains:15] Intake/Output this shift: No intake/output data recorded.  PE: Gen:  Alert, appears mildly uncomfortable this am Pulm: normal rate and effort. On o2 Abd: Soft, mild distension, epigastric and ruq tenderness without peritonitis. IR drain with small amount of dark bloody fluid in drain.   Lab Results:  Recent Labs    04/01/21 0154 04/02/21 0041  WBC 19.7* 23.7*  HGB 9.2* 8.3*  HCT 26.4* 24.8*  PLT 501* 466*   BMET Recent Labs    04/01/21 1148 04/02/21 0041  NA 134* 134*  K 3.9 3.6  CL 97* 97*  CO2 23 22  GLUCOSE 108* 106*  BUN 23 25*  CREATININE 5.38* 5.96*  CALCIUM 9.0 8.8*   PT/INR No results for input(s): LABPROT, INR in the last 72 hours. CMP     Component Value Date/Time   NA 134 (L) 04/02/2021 0041   NA 134 (A) 09/11/2019 0000   K 3.6 04/02/2021 0041   CL 97 (L) 04/02/2021 0041   CO2 22 04/02/2021 0041   GLUCOSE 106 (H)  04/02/2021 0041   BUN 25 (H) 04/02/2021 0041   BUN 34 (A) 09/11/2019 0000   CREATININE 5.96 (H) 04/02/2021 0041   CALCIUM 8.8 (L) 04/02/2021 0041   PROT 5.6 (L) 04/02/2021 0041   PROT 6.9 02/19/2018 1014   ALBUMIN 1.8 (L) 04/02/2021 0041   ALBUMIN 4.2 02/19/2018 1014   AST 16 04/02/2021 0041   ALT 6 04/02/2021 0041   ALKPHOS 212 (H) 04/02/2021 0041   BILITOT 0.9 04/02/2021 0041   BILITOT 0.5 02/19/2018 1014   GFRNONAA 9 (L) 04/02/2021 0041   GFRAA 7 (L) 12/03/2019 0710   Lipase     Component Value Date/Time   LIPASE 29 03/31/2021 1102    Studies/Results: CT ABDOMEN PELVIS WO CONTRAST  Result Date: 03/31/2021 CLINICAL DATA:  Nausea and vomiting.  On dialysis. EXAM: CT ABDOMEN AND PELVIS WITHOUT CONTRAST TECHNIQUE: Multidetector CT imaging of the abdomen and pelvis was performed following the standard protocol without IV contrast. RADIATION DOSE REDUCTION: This exam was performed according to the departmental dose-optimization program which includes automated exposure control, adjustment of the mA and/or kV according to patient size and/or use of iterative reconstruction technique. COMPARISON:  Abdominal x-ray 03/31/2021. CT abdomen and pelvis 03/23/2021. FINDINGS: Lower chest: There is a moderate-sized right pleural effusion which has mildly increased. There is compressive  atelectasis of the right lower lobe. There is a small left pleural effusion which appears stable. TAVR present. Hepatobiliary: The gallbladder is dilated and thick-walled. Heterogeneous hyperdensity is again seen within the gallbladder likely related to hemorrhage. Small layering gallstones are again noted. There is a new percutaneous cholecystostomy tube in place. There is a small amount of air in the gallbladder likely related to tube placement. There is no definite biliary ductal dilatation. No focal liver lesions are seen. Pancreas: Unremarkable. No pancreatic ductal dilatation or surrounding inflammatory changes.  Spleen: Normal in size without focal abnormality. Adrenals/Urinary Tract: 2.5 x 2.1 cm left adrenal nodule is indeterminate and unchanged. Right adrenal gland within normal limits. There is bilateral renal atrophy. No urinary tract calculi or hydronephrosis. Bladder is decompressed by Foley catheter. Stomach/Bowel: Stomach is within normal limits. Appendix appears normal. No evidence of bowel wall thickening, distention, or inflammatory changes. Vascular/Lymphatic: Aortic atherosclerosis. No enlarged abdominal or pelvic lymph nodes. Reproductive: Prostate radiotherapy seeds are again noted. Other: There is a small amount of free fluid in the pelvis similar to prior study. There is a small fat containing left inguinal hernia. Mild body wall edema persists. There is also a fat containing umbilical hernia. Musculoskeletal: No acute or significant osseous findings. IMPRESSION: 1. New cholecystostomy tube in place. 2. The gallbladder is dilated. Cholelithiasis again seen. Heterogeneous hyperdensity in the gallbladder likely represents hemorrhage, unchanged. 3. Moderate right pleural effusion has increased. Small left pleural effusion is stable. 4. Small volume ascites and body wall edema are stable. 5.  Aortic Atherosclerosis (ICD10-I70.0). Electronically Signed   By: Ronney Asters M.D.   On: 03/31/2021 18:22   DG CHEST PORT 1 VIEW  Result Date: 04/02/2021 CLINICAL DATA:  Shortness of breath.  Follow-up exam. EXAM: PORTABLE CHEST 1 VIEW COMPARISON:  03/24/2021 and older studies. FINDINGS: Patchy airspace opacity has increased at the right lung base, extending to the right mid lung. Mild linear atelectasis or scarring is noted at the medial left lung base, stable. Remainder of the lungs is clear. Cardiac silhouette normal in size. Stable endovascular aortic valve replacement. No pneumothorax. Stable pigtail catheter in the right upper quadrant. IMPRESSION: 1. Interval increase in airspace opacity at the right lung  base from the previous day's exam, which may reflect reaccumulation of fluid and associated atelectasis, with infection also in the differential diagnosis. 2. No other change.  No pneumothorax. Electronically Signed   By: Lajean Manes M.D.   On: 04/02/2021 08:30   DG Chest Port 1 View  Result Date: 04/01/2021 CLINICAL DATA:  Status post thoracentesis.  Right pleural effusion. EXAM: PORTABLE CHEST 1 VIEW COMPARISON:  03/27/2021. FINDINGS: There is decreased opacity at the right lung base with most of the right hemidiaphragm now defined. Residual opacity at the right base is consistent with atelectasis, likely with a small residual pleural effusion. Remainder of the lungs is clear. No pneumothorax. A pigtail catheter projects in the right upper quadrant. Cardiac silhouette is normal in size. IMPRESSION: 1. Decreased right lung base opacity following thoracentesis. Residual opacity consistent with atelectasis and a small effusion. 2. Remainder of the lungs is clear. 3. No pneumothorax. Electronically Signed   By: Lajean Manes M.D.   On: 04/01/2021 13:37   DG Abd Portable 1V  Result Date: 03/31/2021 CLINICAL DATA:  Emesis EXAM: PORTABLE ABDOMEN - 1 VIEW COMPARISON:  Abdominal radiograph dated March 29, 2021 FINDINGS: Nonobstructive bowel gas pattern. Surgical drain noted in the right upper quadrant. Partially visualized left lung bases clear.  IMPRESSION: Nonobstructive bowel gas pattern. Electronically Signed   By: Yetta Glassman M.D.   On: 03/31/2021 10:49   US THORACENTESIS ASP PLEURAL SPACE W/IMG GUIDE  Result Date: 04/01/2021 INDICATION: Patient with history of end-stage renal disease, AFib, acute cholecystitis status post percutaneous cholecystostomy placement now with worsening bilateral pleural effusions. Request to IR for diagnostic and therapeutic right thoracentesis EXAM: ULTRASOUND GUIDED RIGHT THORACENTESIS MEDICATIONS: 7 mL 1% lidocaine COMPLICATIONS: None immediate. PROCEDURE: An  ultrasound guided thoracentesis was thoroughly discussed with the patient and questions answered. The benefits, risks, alternatives and complications were also discussed. The patient understands and wishes to proceed with the procedure. Written consent was obtained. Ultrasound was performed to localize and mark an adequate pocket of fluid in the RIGHT chest. The area was then prepped and draped in the normal sterile fashion. 1% Lidocaine was used for local anesthesia. Under ultrasound guidance a 6 Fr Safe-T-Centesis catheter was introduced. Thoracentesis was performed. The catheter was removed and a dressing applied. FINDINGS: A total of approximately 1.1 L of dark red fluid was removed. Samples were sent to the laboratory as requested by the clinical team. IMPRESSION: Successful ultrasound guided RIGHT thoracentesis yielding 1.1 L of pleural fluid. Read by Candiss Norse, PA-C Electronically Signed   By: Michaelle Birks M.D.   On: 04/01/2021 19:20    Anti-infectives: Anti-infectives (From admission, onward)    Start     Dose/Rate Route Frequency Ordered Stop   04/03/21 1200  vancomycin (VANCOREADY) IVPB 750 mg/150 mL        750 mg 150 mL/hr over 60 Minutes Intravenous Every M-W-F (Hemodialysis) 04/02/21 0934     04/02/21 1800  fluconazole (DIFLUCAN) IVPB 100 mg        100 mg 50 mL/hr over 60 Minutes Intravenous Every 24 hours 04/01/21 1504     04/02/21 0945  vancomycin (VANCOREADY) IVPB 750 mg/150 mL        750 mg 150 mL/hr over 60 Minutes Intravenous  Once 04/02/21 0853     03/31/21 1200  fluconazole (DIFLUCAN) IVPB 100 mg  Status:  Discontinued        100 mg 50 mL/hr over 60 Minutes Intravenous Every 24 hours 03/31/21 1112 04/01/21 1504   03/30/21 1245  fluconazole (DIFLUCAN) tablet 100 mg  Status:  Discontinued        100 mg Oral Daily 03/30/21 1157 03/31/21 1112   03/28/21 1645  cefOXitin (MEFOXIN) 2 g in sodium chloride 0.9 % 100 mL IVPB       Note to Pharmacy: Please send to tube station  17 radiology- for procedure   2 g 200 mL/hr over 30 Minutes Intravenous  Once 03/28/21 1550 03/28/21 1720   03/27/21 1645  vancomycin (VANCOREADY) IVPB 1500 mg/300 mL        1,500 mg 150 mL/hr over 120 Minutes Intravenous  Once 03/27/21 1632 03/27/21 1942   03/27/21 1630  piperacillin-tazobactam (ZOSYN) IVPB 2.25 g  Status:  Discontinued        2.25 g 100 mL/hr over 30 Minutes Intravenous Every 8 hours 03/27/21 1617 04/02/21 0829        Assessment/Plan Acute Cholecystitis Nausea and vomiting  - H/o of perc chole 1/12, removed 1/19 after becoming dislodged unintentionally - HIDA +. S/p Perc chole drain 1/24. Cx's pending with currently NGTD - Recommend continuing abx from our standpoint for at least 10d - Patient with persistent nausea. Earlier in the week it seemed most of his pain was in his epigastrium rating to  his left upper quadrant. Today it is more on the R side. CT scan done 1/27. Stomach is within normal limits. No evidence of bowel wall thickening, distention, or inflammatory changes. CT 1/27 also showed cholecystostomy tube in place. Lipase wnl 1/27. AST/ALT/T. Bili wnl. Alk phos 212, which is up from 133 yesterday. WBC up at 23.7 this am. [facility] reports they have ordered a contrast CT scan.  Will follow up on this. Perc chole output has decreased. Would like to make sure perc chole remains in good position. If perc chole is in place and functioning this should theoretically be sufficient to treat his Cholecystitis.  I had discussed with my attending about this patients case. Would recommended ruling out other etiologies of leukocytosis +  nausea and vomiting. Cx pending from thora yesterday and bcx pending this morning. Will add on CT chest as well to r/o PNA given patient has a cough and is on o2. During last admission he had complicated UA and still has foley currently. Could have GI to see to help evaluate n/v.  If all else is negative and GB seems to be driving factor of WBC despite  functioning IR perc chole and abx, next step may need to be lap chole but again would like to rule out all other etiologies. Patient would still be at high risk given underlying medical conditions. Would ask [facility] to assess for risk and to start optimizing. I discussed with the patient, his son and his daughter at length. I spoke with Martelle in person earlier about this patient as well. Will discuss with my attending further and update recs if anything changes.   FEN: NPO ID: [email or web address] 1/23. Zosyn 1/23 >  VTE: Okay for chemical prophylaxis or anticoagulation from our standpoint. Anticoagulation d/c'd during prior admission 2/2 GI bleed Follow-Up: Dr. 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 Cx's pending.  Hx Aortic Stenosis s/p TAVR HTN HLD Hx Parkinson's  This care required high  level of medical decision making.    LOS: 5 days    Jillyn Ledger , [facility] 04/02/2021, 9:46 AM Please see Amion for pager number during day hours 7:00am-4:30pm

## 2021-04-02 NOTE — Progress Notes (Signed)
The patient is complaining of chest pain of 10/10 on a pain scale.NTG sublingual administered x 2. with no relieve. BP dropped as noted in epic and as such couldn't get the 3rd NTG. Notified RT and Blount NP with multiple orders. Will implement orders as received and continue to monitor.

## 2021-04-02 NOTE — Progress Notes (Addendum)
Pharmacy Antibiotic Note  Elijah White is a 81 y.o. male admitted on 03/27/2021 with sepsis. Patient presented with abdominal pain, nausea, and vomiting following recent admission for acute cholecystitis. RUQS this admit revealed distended gallbladder with cholelithiasis, sludge, and pericholecystic fluid. Cholecstostomy tube placed by IR 1/24, and patient has experienced severe pain from tube entry site. Patient also underwent thoracentesis 1/29 for bilateral pleural effusions, with 1.1 L dark red fluid removed. Patient continues to have fever and leukocytosis and per conversation with Dr. Tawanna Solo, there is now greater concern for PNA/parapneumonic effusion.  Pharmacy has been consulted for vancomycin dosing.  Of note, patient is ESRD and receives HD MWF. Patient was loaded with Vancomycin 1,500 mg in the ED 1/23. Since that time, patient has had two HD sessions (1/25, 1/27) lasting 3h and 2.5h respectively, with a BFR of 400 for most of each session. Calculated level at this time is ~12, as such patient will receive a smaller load.   Plan: Vancomycin 750 mg IV x1  Vancomycin 750 mg IV with HD MWF  Cefepime 1g IV Q24H  F/U cultures, clinical improvement, de-escalation, length of HD sessions, levels as indicated   Height: 5\' 5"  (165.1 cm) Weight: 66.7 kg (147 lb 0.8 oz) IBW/kg (Calculated) : 61.5  Temp (24hrs), Avg:97.9 F (36.6 C), Min:97.9 F (36.6 C), Max:97.9 F (36.6 C)  Recent Labs  Lab 03/29/21 0346 03/30/21 0241 03/31/21 0836 04/01/21 0154 04/01/21 1148 04/02/21 0041  WBC 14.4* 17.1* 13.7* 19.7*  --  23.7*  CREATININE 7.04* 5.55*  --  4.85* 5.38* 5.96*    Estimated Creatinine Clearance: 8.3 mL/min (A) (by C-G formula based on SCr of 5.96 mg/dL (H)).    No Known Allergies  Antimicrobials this admission: Vancomycin 1/23 x1, Vancomycin 1/29>> Cefepime 1/29>> Fluconazole 1/26>>  Zosyn 1/23>>1/29  Microbiology results: COVID/FLU PCR: negative  1/23 Bcx:  negative-final  1/24 gallbladder abscess culture: ngtd  1/28 pleural fluid cx: ngtd, no organisms seen  1/28 Bcx: sent  1/28 pleural fluid analysis: total nucleated cell count 1,264, neutrophil count 54%, protein 3.5   Adria Dill, PharmD PGY-1 Acute Care Resident  04/02/2021 9:28 AM

## 2021-04-03 LAB — CBC WITH DIFFERENTIAL/PLATELET
Abs Immature Granulocytes: 0.11 10*3/uL — ABNORMAL HIGH (ref 0.00–0.07)
Basophils Absolute: 0.1 10*3/uL (ref 0.0–0.1)
Basophils Relative: 0 %
Eosinophils Absolute: 0.4 10*3/uL (ref 0.0–0.5)
Eosinophils Relative: 2 %
HCT: 25.3 % — ABNORMAL LOW (ref 39.0–52.0)
Hemoglobin: 8.4 g/dL — ABNORMAL LOW (ref 13.0–17.0)
Immature Granulocytes: 1 %
Lymphocytes Relative: 5 %
Lymphs Abs: 0.9 10*3/uL (ref 0.7–4.0)
MCH: 34.1 pg — ABNORMAL HIGH (ref 26.0–34.0)
MCHC: 33.2 g/dL (ref 30.0–36.0)
MCV: 102.8 fL — ABNORMAL HIGH (ref 80.0–100.0)
Monocytes Absolute: 1.4 10*3/uL — ABNORMAL HIGH (ref 0.1–1.0)
Monocytes Relative: 7 %
Neutro Abs: 15.9 10*3/uL — ABNORMAL HIGH (ref 1.7–7.7)
Neutrophils Relative %: 85 %
Platelets: 442 10*3/uL — ABNORMAL HIGH (ref 150–400)
RBC: 2.46 MIL/uL — ABNORMAL LOW (ref 4.22–5.81)
RDW: 15 % (ref 11.5–15.5)
WBC: 18.7 10*3/uL — ABNORMAL HIGH (ref 4.0–10.5)
nRBC: 0 % (ref 0.0–0.2)

## 2021-04-03 LAB — MRSA NEXT GEN BY PCR, NASAL: MRSA by PCR Next Gen: NOT DETECTED

## 2021-04-03 LAB — VANCOMYCIN, RANDOM: Vancomycin Rm: 23

## 2021-04-03 LAB — BASIC METABOLIC PANEL
Anion gap: 18 — ABNORMAL HIGH (ref 5–15)
BUN: 33 mg/dL — ABNORMAL HIGH (ref 8–23)
CO2: 20 mmol/L — ABNORMAL LOW (ref 22–32)
Calcium: 8.9 mg/dL (ref 8.9–10.3)
Chloride: 97 mmol/L — ABNORMAL LOW (ref 98–111)
Creatinine, Ser: 7.25 mg/dL — ABNORMAL HIGH (ref 0.61–1.24)
GFR, Estimated: 7 mL/min — ABNORMAL LOW (ref >60)
Glucose, Bld: 87 mg/dL (ref 70–99)
Potassium: 3.4 mmol/L — ABNORMAL LOW (ref 3.5–5.1)
Sodium: 135 mmol/L (ref 135–145)

## 2021-04-03 LAB — GLUCOSE, CAPILLARY: Glucose-Capillary: 96 mg/dL (ref 70–99)

## 2021-04-03 MED ORDER — POTASSIUM CHLORIDE CRYS ER 20 MEQ PO TBCR
20.0000 meq | EXTENDED_RELEASE_TABLET | Freq: Once | ORAL | Status: AC
  Administered 2021-04-03: 20 meq via ORAL
  Filled 2021-04-03 (×2): qty 1

## 2021-04-03 MED ORDER — SODIUM CHLORIDE 0.9 % IV SOLN
2.0000 g | INTRAVENOUS | Status: DC
  Administered 2021-04-03: 2 g via INTRAVENOUS
  Filled 2021-04-03: qty 2

## 2021-04-03 MED ORDER — CHLORHEXIDINE GLUCONATE CLOTH 2 % EX PADS
6.0000 | MEDICATED_PAD | Freq: Every day | CUTANEOUS | Status: DC
  Administered 2021-04-03 – 2021-04-04 (×2): 6 via TOPICAL

## 2021-04-03 NOTE — Progress Notes (Signed)
Subjective/Chief Complaint: CT scan showed no significant intra-abdominal findings; smaller right pleural effusion with evidence of RUL/ RLL pneumonia.   WBC improved today - afebrile   Objective: Vital signs in last 24 hours: Temp:  [97.7 F (36.5 C)-98.1 F (36.7 C)] 98.1 F (36.7 C) (01/30 0700) Pulse Rate:  [67-75] 67 (01/30 0700) Resp:  [16-19] 19 (01/30 0700) BP: (110-142)/(32-64) 137/64 (01/30 0700) SpO2:  [99 %-100 %] 99 % (01/30 0700) Weight:  [68.5 kg] 68.5 kg (01/30 0642) Last BM Date: 04/02/21  Intake/Output from previous day: No intake/output data recorded. Intake/Output this shift: No intake/output data recorded.  Elderly male in NAD Abd - soft, right chest/ RUQ tenderness - mild IR dark bilious drainage  Lab Results:  Recent Labs    04/02/21 0041 04/03/21 0249  WBC 23.7* 18.7*  HGB 8.3* 8.4*  HCT 24.8* 25.3*  PLT 466* 442*   BMET Recent Labs    04/02/21 0041 04/03/21 0249  NA 134* 135  K 3.6 3.4*  CL 97* 97*  CO2 22 20*  GLUCOSE 106* 87  BUN 25* 33*  CREATININE 5.96* 7.25*  CALCIUM 8.8* 8.9   PT/INR No results for input(s): LABPROT, INR in the last 72 hours. ABG No results for input(s): PHART, HCO3 in the last 72 hours.  Invalid input(s): PCO2, PO2  Studies/Results: CT CHEST W CONTRAST  Result Date: 04/02/2021 CLINICAL DATA:  Acute abdominal pain.  Pneumonia. EXAM: CT CHEST, ABDOMEN, AND PELVIS WITH CONTRAST TECHNIQUE: Multidetector CT imaging of the chest, abdomen and pelvis was performed following the standard protocol during bolus administration of intravenous contrast. RADIATION DOSE REDUCTION: This exam was performed according to the departmental dose-optimization program which includes automated exposure control, adjustment of the mA and/or kV according to patient size and/or use of iterative reconstruction technique. CONTRAST:  187mL OMNIPAQUE IOHEXOL 350 MG/ML SOLN COMPARISON:  CT abdomen and pelvis 03/31/2021. CT angiogram  chest abdomen and pelvis 12/01/2019. FINDINGS: CT CHEST FINDINGS Cardiovascular: The heart is mildly enlarged. There is no pericardial effusion. Aorta is normal in size. Patient is status post TAVR. There are atherosclerotic calcifications of the aorta and coronary arteries. Mediastinum/Nodes: No enlarged mediastinal, hilar, or axillary lymph nodes. Thyroid gland, trachea, and esophagus demonstrate no significant findings. Lungs/Pleura: There are small bilateral pleural effusions, right greater than left. Right pleural effusion has decreased from the prior examination. Right pleural effusion is likely partially loculated posteriorly. There is also small amount of loculated fluid in the right major fissure. There are patchy ground-glass and airspace opacities in the right upper lobe and right lower lobe. There is atelectasis in the left lower lobe. There is no evidence for pneumothorax. There is some secretions in the trachea. Trachea and central airways appear patent. There is a stable 3 mm nodule in the left upper lobe image 4/52. No new pulmonary nodules are identified. Musculoskeletal: No acute fractures. Degenerative changes affect the spine. CT ABDOMEN PELVIS FINDINGS Hepatobiliary: Percutaneous cholecystostomy tube is again seen. The gallbladder is dilated similar to the prior study. There is a small amount of air in the gallbladder. Layering small stones are unchanged. Heterogeneous hyperdensity throughout the gallbladder likely represents hemorrhagic products, also unchanged. There is no biliary ductal dilatation. No focal liver lesions are seen. Pancreas: Unremarkable. No pancreatic ductal dilatation or surrounding inflammatory changes. Spleen: Normal in size without focal abnormality. Adrenals/Urinary Tract: 2.5 cm left adrenal nodule is indeterminate and unchanged. Right adrenal gland is within normal limits. The bladder is decompressed by Foley catheter. There  is no hydronephrosis. There is a cortical  hypodensity in the superior pole the left kidney which is too small to characterize, likely cysts. Stomach/Bowel: Appendix appears normal. No evidence of bowel wall thickening, distention, or inflammatory changes. There is scattered air-fluid levels throughout the colon. The stomach is completely decompressed and gastric wall thickening can not be excluded. Vascular/Lymphatic: Aortic atherosclerosis. No enlarged abdominal or pelvic lymph nodes. Reproductive: Prostate radiotherapy seeds are present. Other: There is a small fat containing umbilical hernia. There is trace ascites, decreased from prior. There is mild body wall edema which is similar to the prior study. Musculoskeletal: No acute or significant osseous findings. IMPRESSION: 1. Small right pleural effusion has decreased in size. This effusion is partially loculated posteriorly and within the major fissure. Loculation may be secondary to chronicity, hemorrhage or malignancy. 2. Patchy ground-glass and airspace opacities in the right upper lobe and right lower lobe most consistent with pneumonia. 3. Stable small left pleural effusion. 4. The gallbladder is dilated. Cholecystostomy tube is in place. Gallbladder air, hemorrhagic products and gallstones are present. Appearance is unchanged from the prior study. 5. Stable body wall edema and trace ascites. 6. Stable indeterminate left adrenal nodule. 7. Stable 3 mm left upper lobe pulmonary nodule. No follow-up needed if patient is low-risk. Non-contrast chest CT can be considered in 12 months if patient is high-risk. This recommendation follows the consensus statement: Guidelines for Management of Incidental Pulmonary Nodules Detected on CT Images: From the Fleischner Society 2017; Radiology 2017; 284:228-243. Electronically Signed   By: Ronney Asters M.D.   On: 04/02/2021 21:11   CT ABDOMEN PELVIS W CONTRAST  Result Date: 04/02/2021 CLINICAL DATA:  Acute abdominal pain.  Pneumonia. EXAM: CT CHEST, ABDOMEN,  AND PELVIS WITH CONTRAST TECHNIQUE: Multidetector CT imaging of the chest, abdomen and pelvis was performed following the standard protocol during bolus administration of intravenous contrast. RADIATION DOSE REDUCTION: This exam was performed according to the departmental dose-optimization program which includes automated exposure control, adjustment of the mA and/or kV according to patient size and/or use of iterative reconstruction technique. CONTRAST:  142mL OMNIPAQUE IOHEXOL 350 MG/ML SOLN COMPARISON:  CT abdomen and pelvis 03/31/2021. CT angiogram chest abdomen and pelvis 12/01/2019. FINDINGS: CT CHEST FINDINGS Cardiovascular: The heart is mildly enlarged. There is no pericardial effusion. Aorta is normal in size. Patient is status post TAVR. There are atherosclerotic calcifications of the aorta and coronary arteries. Mediastinum/Nodes: No enlarged mediastinal, hilar, or axillary lymph nodes. Thyroid gland, trachea, and esophagus demonstrate no significant findings. Lungs/Pleura: There are small bilateral pleural effusions, right greater than left. Right pleural effusion has decreased from the prior examination. Right pleural effusion is likely partially loculated posteriorly. There is also small amount of loculated fluid in the right major fissure. There are patchy ground-glass and airspace opacities in the right upper lobe and right lower lobe. There is atelectasis in the left lower lobe. There is no evidence for pneumothorax. There is some secretions in the trachea. Trachea and central airways appear patent. There is a stable 3 mm nodule in the left upper lobe image 4/52. No new pulmonary nodules are identified. Musculoskeletal: No acute fractures. Degenerative changes affect the spine. CT ABDOMEN PELVIS FINDINGS Hepatobiliary: Percutaneous cholecystostomy tube is again seen. The gallbladder is dilated similar to the prior study. There is a small amount of air in the gallbladder. Layering small stones are  unchanged. Heterogeneous hyperdensity throughout the gallbladder likely represents hemorrhagic products, also unchanged. There is no biliary ductal dilatation. No focal  liver lesions are seen. Pancreas: Unremarkable. No pancreatic ductal dilatation or surrounding inflammatory changes. Spleen: Normal in size without focal abnormality. Adrenals/Urinary Tract: 2.5 cm left adrenal nodule is indeterminate and unchanged. Right adrenal gland is within normal limits. The bladder is decompressed by Foley catheter. There is no hydronephrosis. There is a cortical hypodensity in the superior pole the left kidney which is too small to characterize, likely cysts. Stomach/Bowel: Appendix appears normal. No evidence of bowel wall thickening, distention, or inflammatory changes. There is scattered air-fluid levels throughout the colon. The stomach is completely decompressed and gastric wall thickening can not be excluded. Vascular/Lymphatic: Aortic atherosclerosis. No enlarged abdominal or pelvic lymph nodes. Reproductive: Prostate radiotherapy seeds are present. Other: There is a small fat containing umbilical hernia. There is trace ascites, decreased from prior. There is mild body wall edema which is similar to the prior study. Musculoskeletal: No acute or significant osseous findings. IMPRESSION: 1. Small right pleural effusion has decreased in size. This effusion is partially loculated posteriorly and within the major fissure. Loculation may be secondary to chronicity, hemorrhage or malignancy. 2. Patchy ground-glass and airspace opacities in the right upper lobe and right lower lobe most consistent with pneumonia. 3. Stable small left pleural effusion. 4. The gallbladder is dilated. Cholecystostomy tube is in place. Gallbladder air, hemorrhagic products and gallstones are present. Appearance is unchanged from the prior study. 5. Stable body wall edema and trace ascites. 6. Stable indeterminate left adrenal nodule. 7. Stable 3  mm left upper lobe pulmonary nodule. No follow-up needed if patient is low-risk. Non-contrast chest CT can be considered in 12 months if patient is high-risk. This recommendation follows the consensus statement: Guidelines for Management of Incidental Pulmonary Nodules Detected on CT Images: From the Fleischner Society 2017; Radiology 2017; 284:228-243. Electronically Signed   By: Ronney Asters M.D.   On: 04/02/2021 21:11   DG CHEST PORT 1 VIEW  Result Date: 04/02/2021 CLINICAL DATA:  Shortness of breath.  Follow-up exam. EXAM: PORTABLE CHEST 1 VIEW COMPARISON:  03/24/2021 and older studies. FINDINGS: Patchy airspace opacity has increased at the right lung base, extending to the right mid lung. Mild linear atelectasis or scarring is noted at the medial left lung base, stable. Remainder of the lungs is clear. Cardiac silhouette normal in size. Stable endovascular aortic valve replacement. No pneumothorax. Stable pigtail catheter in the right upper quadrant. IMPRESSION: 1. Interval increase in airspace opacity at the right lung base from the previous day's exam, which may reflect reaccumulation of fluid and associated atelectasis, with infection also in the differential diagnosis. 2. No other change.  No pneumothorax. Electronically Signed   By: Lajean Manes M.D.   On: 04/02/2021 08:30   DG Chest Port 1 View  Result Date: 04/01/2021 CLINICAL DATA:  Status post thoracentesis.  Right pleural effusion. EXAM: PORTABLE CHEST 1 VIEW COMPARISON:  03/27/2021. FINDINGS: There is decreased opacity at the right lung base with most of the right hemidiaphragm now defined. Residual opacity at the right base is consistent with atelectasis, likely with a small residual pleural effusion. Remainder of the lungs is clear. No pneumothorax. A pigtail catheter projects in the right upper quadrant. Cardiac silhouette is normal in size. IMPRESSION: 1. Decreased right lung base opacity following thoracentesis. Residual opacity  consistent with atelectasis and a small effusion. 2. Remainder of the lungs is clear. 3. No pneumothorax. Electronically Signed   By: Lajean Manes M.D.   On: 04/01/2021 13:37   US THORACENTESIS ASP PLEURAL SPACE W/IMG  GUIDE  Result Date: 04/01/2021 INDICATION: Patient with history of end-stage renal disease, AFib, acute cholecystitis status post percutaneous cholecystostomy placement now with worsening bilateral pleural effusions. Request to IR for diagnostic and therapeutic right thoracentesis EXAM: ULTRASOUND GUIDED RIGHT THORACENTESIS MEDICATIONS: 7 mL 1% lidocaine COMPLICATIONS: None immediate. PROCEDURE: An ultrasound guided thoracentesis was thoroughly discussed with the patient and questions answered. The benefits, risks, alternatives and complications were also discussed. The patient understands and wishes to proceed with the procedure. Written consent was obtained. Ultrasound was performed to localize and mark an adequate pocket of fluid in the RIGHT chest. The area was then prepped and draped in the normal sterile fashion. 1% Lidocaine was used for local anesthesia. Under ultrasound guidance a 6 Fr Safe-T-Centesis catheter was introduced. Thoracentesis was performed. The catheter was removed and a dressing applied. FINDINGS: A total of approximately 1.1 L of dark red fluid was removed. Samples were sent to the laboratory as requested by the clinical team. IMPRESSION: Successful ultrasound guided RIGHT thoracentesis yielding 1.1 L of pleural fluid. Read by Candiss Norse, PA-C Electronically Signed   By: Michaelle Birks M.D.   On: 04/01/2021 19:20    Anti-infectives: Anti-infectives (From admission, onward)    Start     Dose/Rate Route Frequency Ordered Stop   04/03/21 1200  vancomycin (VANCOREADY) IVPB 750 mg/150 mL        750 mg 150 mL/hr over 60 Minutes Intravenous Every M-W-F (Hemodialysis) 04/02/21 0934     04/02/21 1800  fluconazole (DIFLUCAN) IVPB 100 mg        100 mg 50 mL/hr over 60  Minutes Intravenous Every 24 hours 04/01/21 1504     04/02/21 1700  ceFEPIme (MAXIPIME) 1 g in sodium chloride 0.9 % 100 mL IVPB        1 g 200 mL/hr over 30 Minutes Intravenous Every 24 hours 04/02/21 1509     04/02/21 0945  vancomycin (VANCOREADY) IVPB 750 mg/150 mL        750 mg 150 mL/hr over 60 Minutes Intravenous  Once 04/02/21 0853 04/02/21 1158   03/31/21 1200  fluconazole (DIFLUCAN) IVPB 100 mg  Status:  Discontinued        100 mg 50 mL/hr over 60 Minutes Intravenous Every 24 hours 03/31/21 1112 04/01/21 1504   03/30/21 1245  fluconazole (DIFLUCAN) tablet 100 mg  Status:  Discontinued        100 mg Oral Daily 03/30/21 1157 03/31/21 1112   03/28/21 1645  cefOXitin (MEFOXIN) 2 g in sodium chloride 0.9 % 100 mL IVPB       Note to Pharmacy: Please send to tube station 17 radiology- for procedure   2 g 200 mL/hr over 30 Minutes Intravenous  Once 03/28/21 1550 03/28/21 1720   03/27/21 1645  vancomycin (VANCOREADY) IVPB 1500 mg/300 mL        1,500 mg 150 mL/hr over 120 Minutes Intravenous  Once 03/27/21 1632 03/27/21 1942   03/27/21 1630  piperacillin-tazobactam (ZOSYN) IVPB 2.25 g  Status:  Discontinued        2.25 g 100 mL/hr over 30 Minutes Intravenous Every 8 hours 03/27/21 1617 04/02/21 0829       Assessment/Plan: Acute Cholecystitis - H/o of perc chole 1/12, removed 1/19 after becoming dislodged unintentionally - HIDA +. S/p Perc chole drain 1/24. Cx's pending with currently NGTD - Recommend continuing abx from our standpoint for at least 10d - WBC likely secondary to pneumonia - GB tube is in place  FEN: NPO ID: [id number] 1/23. Zosyn 1/23 >  VTE: Okay for chemical prophylaxis or anticoagulation from our standpoint. Anticoagulation d/c'd during prior admission 2/2 GI bleed Follow-Up: Dr. 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 Cx's pending.  Hx Aortic  Stenosis s/p TAVR HTN HLD Hx Parkinson's   This care required moderate level of medical decision making.   LOS: 6 days    Maia Petties 04/03/2021

## 2021-04-03 NOTE — Progress Notes (Addendum)
Referring Physician(s): Dr. Rolla Plate  Supervising Physician: Sandi Mariscal  Patient Status:  [facility] - In-pt  Chief Complaint:  Image guided drain placement, perc chole, 31F pigtail drain 03/28/21 w/ Dr. Earleen Newport.  Subjective:  Pt lying in bed. He endorses pain with movement at site. Pt has no other c/o.   Allergies: Patient has no known allergies.  Medications: Prior to Admission medications   Medication Sig Start Date End Date Taking? Authorizing Provider  acetaminophen (TYLENOL) 500 MG tablet Take 1,000 mg by mouth every 6 (six) hours as needed for moderate pain or headache.   Yes [provider]  atorvastatin (LIPITOR) 10 MG tablet Take 1 tablet (10 mg total) by mouth in the morning. 08/16/20  Yes Rhyne, Samantha J, PA-C  B Complex-C-Folic Acid (DIALYVITE 564) 0.8 MG TABS Take 1 tablet by mouth in the morning. 05/08/19  Yes [provider]  brimonidine (ALPHAGAN) 0.15 % ophthalmic solution Place 1 drop into the right eye 3 (three) times daily.   Yes [provider]  carbidopa-levodopa (SINEMET) 25-100 MG tablet Take 1 tablet by mouth 3 (three) times daily. Patient taking differently: Take 1 tablet by mouth 3 (three) times daily. 1 TABLET @ 8am/ 12pm/ 4pm 10/19/20 05/13/21 Yes Curatolo, Adam, DO  clopidogrel (PLAVIX) 75 MG tablet Take 1 tablet (75 mg total) by mouth daily with breakfast. 07/25/20  Yes Marty Heck, MD  docusate sodium (COLACE) 100 MG capsule Take 100 mg by mouth 2 (two) times daily.   Yes [provider]  dorzolamide-timolol (COSOPT) 22.3-6.8 MG/ML ophthalmic solution Place 1 drop into the right eye 2 (two) times daily. 06/25/17  Yes [provider]  ferric citrate (AURYXIA) 1 GM 210 MG(Fe) tablet Take 420 mg by mouth 3 (three) times daily with meals.   Yes [provider]  gabapentin (NEURONTIN) 100 MG capsule Take 1 capsule (100 mg total) by mouth daily. 10/19/20 05/13/21 Yes Curatolo, Adam, DO  hydroxypropyl  methylcellulose / hypromellose (GONIOSOFT) 2.5 % ophthalmic solution Place 1 drop into the left eye 2 (two) times daily as needed for dry eyes.   Yes [provider]  Melatonin 5 MG CAPS Take 5 mg by mouth at bedtime.   Yes [provider]  metoprolol tartrate (LOPRESSOR) 25 MG tablet Take 0.5 tablets (12.5 mg total) by mouth 2 (two) times daily. Hold if sbp less than 100 03/26/21 03/26/22 Yes Florencia Reasons, MD  nystatin (MYCOSTATIN) 100000 UNIT/ML suspension Take 5 mLs (500,000 Units total) by mouth 4 (four) times daily. 03/26/21  Yes Florencia Reasons, MD  pantoprazole (PROTONIX) 40 MG tablet Take 1 tablet (40 mg total) by mouth daily at 12 noon. 08/16/20  Yes Rhyne, Hulen Shouts, PA-C  pilocarpine (PILOCAR) 4 % ophthalmic solution Place 1 drop into the right eye 4 (four) times daily.    Yes [provider]  polyethylene glycol (MIRALAX / GLYCOLAX) 17 g packet Take 17 g by mouth daily. 03/26/21  Yes Florencia Reasons, MD  ROCKLATAN 0.02-0.005 % SOLN Place 1 drop into the right eye at bedtime. 06/20/20  Yes [provider]  senna-docusate (SENOKOT-S) 8.6-50 MG tablet Take 1 tablet by mouth at bedtime. 03/26/21  Yes Florencia Reasons, MD  sevelamer carbonate (RENVELA) 800 MG tablet Take 2 tablets (1,600 mg total) by mouth 3 (three) times daily with meals. 03/26/21  Yes Florencia Reasons, MD  Sodium Phosphates (RA SALINE ENEMA RE) Place 1 enema rectally daily as needed (for constipation not relieved by bisacodyl).  Yes [provider]  blood glucose meter kit and supplies KIT Dispense based on patient and insurance preference. Use up to four times daily as directed. (FOR ICD-9 250.00, 250.01). For QAC - HS accuchecks. 03/30/18   Thurnell Lose, MD  Doxercalciferol (HECTOROL IV) Doxercalciferol (Hectorol) 08/09/20 08/28/21  [provider]  Methoxy PEG-Epoetin Beta (MIRCERA IJ) Mircera 09/05/20 09/04/21  [provider]  ondansetron (ZOFRAN) 4 MG tablet Take 1 tablet (4 mg total) by mouth daily  as needed for nausea or vomiting. Patient not taking: Reported on 03/27/2021 03/26/21   Florencia Reasons, MD  [facility] VERIO test strip USE AS DIRECTED TO TEST FOUR TIMES A DAY 07/31/18   [name], MD     Vital Signs: BP 137/64 (BP Location: Right Wrist)    Pulse 67    Temp 98.1 F (36.7 C) (Oral)    Resp 19    Ht $R'5\' 5"'Tu$  (1.651 m)    Wt 151 lb 0.2 oz (68.5 kg)    SpO2 99%    BMI 25.13 kg/m   Physical Exam Vitals reviewed.  Constitutional:      Appearance: He is not ill-appearing.  HENT:     Head: Normocephalic and atraumatic.  Cardiovascular:     Rate and Rhythm: Normal rate.  Pulmonary:     Effort: Pulmonary effort is normal. No respiratory distress.  Abdominal:     Comments: Drain to RUQ unremarkable with sutures/stat lock in place. No redness, bleeding or drainage noted. Dressing C/D/I. Scant amt serosanguinous OP in gravity bag. Flushes easily.   Skin:    General: Skin is warm and dry.  Neurological:     Mental Status: He is alert. He is disoriented.  Psychiatric:        Mood and Affect: Mood normal.        Behavior: Behavior normal.    Imaging: CT ABDOMEN PELVIS WO CONTRAST  Result Date: 03/31/2021 CLINICAL DATA:  Nausea and vomiting.  On dialysis. EXAM: CT ABDOMEN AND PELVIS WITHOUT CONTRAST TECHNIQUE: Multidetector CT imaging of the abdomen and pelvis was performed following the standard protocol without IV contrast. RADIATION DOSE REDUCTION: This exam was performed according to the departmental dose-optimization program which includes automated exposure control, adjustment of the mA and/or kV according to patient size and/or use of iterative reconstruction technique. COMPARISON:  Abdominal x-ray 03/31/2021. CT abdomen and pelvis 03/23/2021. FINDINGS: Lower chest: There is a moderate-sized right pleural effusion which has mildly increased. There is compressive atelectasis of the right lower lobe. There is a small left pleural effusion which appears stable. TAVR present.  Hepatobiliary: The gallbladder is dilated and thick-walled. Heterogeneous hyperdensity is again seen within the gallbladder likely related to hemorrhage. Small layering gallstones are again noted. There is a new percutaneous cholecystostomy tube in place. There is a small amount of air in the gallbladder likely related to tube placement. There is no definite biliary ductal dilatation. No focal liver lesions are seen. Pancreas: Unremarkable. No pancreatic ductal dilatation or surrounding inflammatory changes. Spleen: Normal in size without focal abnormality. Adrenals/Urinary Tract: 2.5 x 2.1 cm left adrenal nodule is indeterminate and unchanged. Right adrenal gland within normal limits. There is bilateral renal atrophy. No urinary tract calculi or hydronephrosis. Bladder is decompressed by Foley catheter. Stomach/Bowel: Stomach is within normal limits. Appendix appears normal. No evidence of bowel wall thickening, distention, or inflammatory changes. Vascular/Lymphatic: Aortic atherosclerosis. No enlarged abdominal or pelvic lymph nodes. Reproductive: Prostate radiotherapy seeds are again noted. Other: There is a  small amount of free fluid in the pelvis similar to prior study. There is a small fat containing left inguinal hernia. Mild body wall edema persists. There is also a fat containing umbilical hernia. Musculoskeletal: No acute or significant osseous findings. IMPRESSION: 1. New cholecystostomy tube in place. 2. The gallbladder is dilated. Cholelithiasis again seen. Heterogeneous hyperdensity in the gallbladder likely represents hemorrhage, unchanged. 3. Moderate right pleural effusion has increased. Small left pleural effusion is stable. 4. Small volume ascites and body wall edema are stable. 5.  Aortic Atherosclerosis (ICD10-I70.0). Electronically Signed   By: Ronney Asters M.D.   On: 03/31/2021 18:22   CT CHEST W CONTRAST  Result Date: 04/02/2021 CLINICAL DATA:  Acute abdominal pain.  Pneumonia. EXAM: CT  CHEST, ABDOMEN, AND PELVIS WITH CONTRAST TECHNIQUE: Multidetector CT imaging of the chest, abdomen and pelvis was performed following the standard protocol during bolus administration of intravenous contrast. RADIATION DOSE REDUCTION: This exam was performed according to the departmental dose-optimization program which includes automated exposure control, adjustment of the mA and/or kV according to patient size and/or use of iterative reconstruction technique. CONTRAST:  154mL OMNIPAQUE IOHEXOL 350 MG/ML SOLN COMPARISON:  CT abdomen and pelvis 03/31/2021. CT angiogram chest abdomen and pelvis 12/01/2019. FINDINGS: CT CHEST FINDINGS Cardiovascular: The heart is mildly enlarged. There is no pericardial effusion. Aorta is normal in size. Patient is status post TAVR. There are atherosclerotic calcifications of the aorta and coronary arteries. Mediastinum/Nodes: No enlarged mediastinal, hilar, or axillary lymph nodes. Thyroid gland, trachea, and esophagus demonstrate no significant findings. Lungs/Pleura: There are small bilateral pleural effusions, right greater than left. Right pleural effusion has decreased from the prior examination. Right pleural effusion is likely partially loculated posteriorly. There is also small amount of loculated fluid in the right major fissure. There are patchy ground-glass and airspace opacities in the right upper lobe and right lower lobe. There is atelectasis in the left lower lobe. There is no evidence for pneumothorax. There is some secretions in the trachea. Trachea and central airways appear patent. There is a stable 3 mm nodule in the left upper lobe image 4/52. No new pulmonary nodules are identified. Musculoskeletal: No acute fractures. Degenerative changes affect the spine. CT ABDOMEN PELVIS FINDINGS Hepatobiliary: Percutaneous cholecystostomy tube is again seen. The gallbladder is dilated similar to the prior study. There is a small amount of air in the gallbladder. Layering  small stones are unchanged. Heterogeneous hyperdensity throughout the gallbladder likely represents hemorrhagic products, also unchanged. There is no biliary ductal dilatation. No focal liver lesions are seen. Pancreas: Unremarkable. No pancreatic ductal dilatation or surrounding inflammatory changes. Spleen: Normal in size without focal abnormality. Adrenals/Urinary Tract: 2.5 cm left adrenal nodule is indeterminate and unchanged. Right adrenal gland is within normal limits. The bladder is decompressed by Foley catheter. There is no hydronephrosis. There is a cortical hypodensity in the superior pole the left kidney which is too small to characterize, likely cysts. Stomach/Bowel: Appendix appears normal. No evidence of bowel wall thickening, distention, or inflammatory changes. There is scattered air-fluid levels throughout the colon. The stomach is completely decompressed and gastric wall thickening can not be excluded. Vascular/Lymphatic: Aortic atherosclerosis. No enlarged abdominal or pelvic lymph nodes. Reproductive: Prostate radiotherapy seeds are present. Other: There is a small fat containing umbilical hernia. There is trace ascites, decreased from prior. There is mild body wall edema which is similar to the prior study. Musculoskeletal: No acute or significant osseous findings. IMPRESSION: 1. Small right pleural effusion has decreased in size.  This effusion is partially loculated posteriorly and within the major fissure. Loculation may be secondary to chronicity, hemorrhage or malignancy. 2. Patchy ground-glass and airspace opacities in the right upper lobe and right lower lobe most consistent with pneumonia. 3. Stable small left pleural effusion. 4. The gallbladder is dilated. Cholecystostomy tube is in place. Gallbladder air, hemorrhagic products and gallstones are present. Appearance is unchanged from the prior study. 5. Stable body wall edema and trace ascites. 6. Stable indeterminate left adrenal  nodule. 7. Stable 3 mm left upper lobe pulmonary nodule. No follow-up needed if patient is low-risk. Non-contrast chest CT can be considered in 12 months if patient is high-risk. This recommendation follows the consensus statement: Guidelines for Management of Incidental Pulmonary Nodules Detected on CT Images: From the Fleischner Society 2017; Radiology 2017; 284:228-243. Electronically Signed   By: Ronney Asters M.D.   On: 04/02/2021 21:11   CT ABDOMEN PELVIS W CONTRAST  Result Date: 04/02/2021 CLINICAL DATA:  Acute abdominal pain.  Pneumonia. EXAM: CT CHEST, ABDOMEN, AND PELVIS WITH CONTRAST TECHNIQUE: Multidetector CT imaging of the chest, abdomen and pelvis was performed following the standard protocol during bolus administration of intravenous contrast. RADIATION DOSE REDUCTION: This exam was performed according to the departmental dose-optimization program which includes automated exposure control, adjustment of the mA and/or kV according to patient size and/or use of iterative reconstruction technique. CONTRAST:  138mL OMNIPAQUE IOHEXOL 350 MG/ML SOLN COMPARISON:  CT abdomen and pelvis 03/31/2021. CT angiogram chest abdomen and pelvis 12/01/2019. FINDINGS: CT CHEST FINDINGS Cardiovascular: The heart is mildly enlarged. There is no pericardial effusion. Aorta is normal in size. Patient is status post TAVR. There are atherosclerotic calcifications of the aorta and coronary arteries. Mediastinum/Nodes: No enlarged mediastinal, hilar, or axillary lymph nodes. Thyroid gland, trachea, and esophagus demonstrate no significant findings. Lungs/Pleura: There are small bilateral pleural effusions, right greater than left. Right pleural effusion has decreased from the prior examination. Right pleural effusion is likely partially loculated posteriorly. There is also small amount of loculated fluid in the right major fissure. There are patchy ground-glass and airspace opacities in the right upper lobe and right lower  lobe. There is atelectasis in the left lower lobe. There is no evidence for pneumothorax. There is some secretions in the trachea. Trachea and central airways appear patent. There is a stable 3 mm nodule in the left upper lobe image 4/52. No new pulmonary nodules are identified. Musculoskeletal: No acute fractures. Degenerative changes affect the spine. CT ABDOMEN PELVIS FINDINGS Hepatobiliary: Percutaneous cholecystostomy tube is again seen. The gallbladder is dilated similar to the prior study. There is a small amount of air in the gallbladder. Layering small stones are unchanged. Heterogeneous hyperdensity throughout the gallbladder likely represents hemorrhagic products, also unchanged. There is no biliary ductal dilatation. No focal liver lesions are seen. Pancreas: Unremarkable. No pancreatic ductal dilatation or surrounding inflammatory changes. Spleen: Normal in size without focal abnormality. Adrenals/Urinary Tract: 2.5 cm left adrenal nodule is indeterminate and unchanged. Right adrenal gland is within normal limits. The bladder is decompressed by Foley catheter. There is no hydronephrosis. There is a cortical hypodensity in the superior pole the left kidney which is too small to characterize, likely cysts. Stomach/Bowel: Appendix appears normal. No evidence of bowel wall thickening, distention, or inflammatory changes. There is scattered air-fluid levels throughout the colon. The stomach is completely decompressed and gastric wall thickening can not be excluded. Vascular/Lymphatic: Aortic atherosclerosis. No enlarged abdominal or pelvic lymph nodes. Reproductive: Prostate radiotherapy seeds are present.  Other: There is a small fat containing umbilical hernia. There is trace ascites, decreased from prior. There is mild body wall edema which is similar to the prior study. Musculoskeletal: No acute or significant osseous findings. IMPRESSION: 1. Small right pleural effusion has decreased in size. This  effusion is partially loculated posteriorly and within the major fissure. Loculation may be secondary to chronicity, hemorrhage or malignancy. 2. Patchy ground-glass and airspace opacities in the right upper lobe and right lower lobe most consistent with pneumonia. 3. Stable small left pleural effusion. 4. The gallbladder is dilated. Cholecystostomy tube is in place. Gallbladder air, hemorrhagic products and gallstones are present. Appearance is unchanged from the prior study. 5. Stable body wall edema and trace ascites. 6. Stable indeterminate left adrenal nodule. 7. Stable 3 mm left upper lobe pulmonary nodule. No follow-up needed if patient is low-risk. Non-contrast chest CT can be considered in 12 months if patient is high-risk. This recommendation follows the consensus statement: Guidelines for Management of Incidental Pulmonary Nodules Detected on CT Images: From the Fleischner Society 2017; Radiology 2017; 284:228-243. Electronically Signed   By: Ronney Asters M.D.   On: 04/02/2021 21:11   DG CHEST PORT 1 VIEW  Result Date: 04/02/2021 CLINICAL DATA:  Shortness of breath.  Follow-up exam. EXAM: PORTABLE CHEST 1 VIEW COMPARISON:  03/24/2021 and older studies. FINDINGS: Patchy airspace opacity has increased at the right lung base, extending to the right mid lung. Mild linear atelectasis or scarring is noted at the medial left lung base, stable. Remainder of the lungs is clear. Cardiac silhouette normal in size. Stable endovascular aortic valve replacement. No pneumothorax. Stable pigtail catheter in the right upper quadrant. IMPRESSION: 1. Interval increase in airspace opacity at the right lung base from the previous day's exam, which may reflect reaccumulation of fluid and associated atelectasis, with infection also in the differential diagnosis. 2. No other change.  No pneumothorax. Electronically Signed   By: Lajean Manes M.D.   On: 04/02/2021 08:30   DG Chest Port 1 View  Result Date:  04/01/2021 CLINICAL DATA:  Status post thoracentesis.  Right pleural effusion. EXAM: PORTABLE CHEST 1 VIEW COMPARISON:  03/27/2021. FINDINGS: There is decreased opacity at the right lung base with most of the right hemidiaphragm now defined. Residual opacity at the right base is consistent with atelectasis, likely with a small residual pleural effusion. Remainder of the lungs is clear. No pneumothorax. A pigtail catheter projects in the right upper quadrant. Cardiac silhouette is normal in size. IMPRESSION: 1. Decreased right lung base opacity following thoracentesis. Residual opacity consistent with atelectasis and a small effusion. 2. Remainder of the lungs is clear. 3. No pneumothorax. Electronically Signed   By: Lajean Manes M.D.   On: 04/01/2021 13:37   DG Abd Portable 1V  Result Date: 03/31/2021 CLINICAL DATA:  Emesis EXAM: PORTABLE ABDOMEN - 1 VIEW COMPARISON:  Abdominal radiograph dated March 29, 2021 FINDINGS: Nonobstructive bowel gas pattern. Surgical drain noted in the right upper quadrant. Partially visualized left lung bases clear. IMPRESSION: Nonobstructive bowel gas pattern. Electronically Signed   By: Yetta Glassman M.D.   On: 03/31/2021 10:49   US THORACENTESIS ASP PLEURAL SPACE W/IMG GUIDE  Result Date: 04/01/2021 INDICATION: Patient with history of end-stage renal disease, AFib, acute cholecystitis status post percutaneous cholecystostomy placement now with worsening bilateral pleural effusions. Request to IR for diagnostic and therapeutic right thoracentesis EXAM: ULTRASOUND GUIDED RIGHT THORACENTESIS MEDICATIONS: 7 mL 1% lidocaine COMPLICATIONS: None immediate. PROCEDURE: An ultrasound guided thoracentesis was  thoroughly discussed with the patient and questions answered. The benefits, risks, alternatives and complications were also discussed. The patient understands and wishes to proceed with the procedure. Written consent was obtained. Ultrasound was performed to localize and mark  an adequate pocket of fluid in the RIGHT chest. The area was then prepped and draped in the normal sterile fashion. 1% Lidocaine was used for local anesthesia. Under ultrasound guidance a 6 Fr Safe-T-Centesis catheter was introduced. Thoracentesis was performed. The catheter was removed and a dressing applied. FINDINGS: A total of approximately 1.1 L of dark red fluid was removed. Samples were sent to the laboratory as requested by the clinical team. IMPRESSION: Successful ultrasound guided RIGHT thoracentesis yielding 1.1 L of pleural fluid. Read by Candiss Norse, PA-C Electronically Signed   By: Michaelle Birks M.D.   On: 04/01/2021 19:20    Labs:  CBC: Recent Labs    03/31/21 0836 04/01/21 0154 04/02/21 0041 04/03/21 0249  WBC 13.7* 19.7* 23.7* 18.7*  HGB 9.5* 9.2* 8.3* 8.4*  HCT 28.6* 26.4* 24.8* 25.3*  PLT 506* 501* 466* 442*    COAGS: Recent Labs    03/28/21 0319  INR 1.2    BMP: Recent Labs    04/01/21 0154 04/01/21 1148 04/02/21 0041 04/03/21 0249  NA 134* 134* 134* 135  K 3.7 3.9 3.6 3.4*  CL 95* 97* 97* 97*  CO2 $Re'23 23 22 'vNC$ 20*  GLUCOSE 104* 108* 106* 87  BUN 21 23 25* 33*  CALCIUM 8.8* 9.0 8.8* 8.9  CREATININE 4.85* 5.38* 5.96* 7.25*  GFRNONAA 11* 10* 9* 7*    LIVER FUNCTION TESTS: Recent Labs    03/27/21 1143 03/28/21 0319 03/31/21 1102 04/01/21 1148 04/02/21 0041  BILITOT 1.0 0.9 0.4  --  0.9  AST 35 122* 20  --  16  ALT $Re'6 8 22  'Gnd$ --  6  ALKPHOS 191* 409* 133*  --  212*  PROT 6.8 5.3* 7.2  --  5.6*  ALBUMIN 2.4* 1.7* 3.4* 2.0* 1.8*    Assessment and Plan:  Drain to RUQ unremarkable with sutures/stat lock in place. No redness, bleeding or drainage noted. Dressing C/D/I.  Scant serosanguinous OP in gravity bag.   Continue to flush TID Record OP in Epic  Change dressing q shift or as needed Call IR with questions or concerns  Electronically Signed: Tyson Alias, NP 04/03/2021, 9:54 AM   I spent a total of 25 Minutes at the the patient's  bedside AND on the patient's hospital floor or unit, greater than 50% of which was counseling/coordinating care for Image guided drain placement, perc chole, 9F pigtail drain 03/28/21 with Dr. Earleen Newport, IR.

## 2021-04-03 NOTE — Progress Notes (Signed)
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  She was recently admitted from 1/11-03/2020 for acute cholecystitis managed with percutaneous cholecystectomy tube which was unintentionally dislodged and subsequently removed on 1/19 and was discharged with oral antibiotics.  After being discharged to nursing facility, he developed severe abdominal pain with associated nausea and vomiting.  On presentation ,he was afebrile, lab work showed worsening leukocytosis.  Right upper quadrant ultrasound showed distended gallbladder with cholelithiasis, sludge, pericholecystic fluid.  Murphy sign was positive.  IR, general surgery consulted.  Underwent cholecystostomy tube placement by IR on 1/24.  Cultures have not shown any growth.  Hospital course remarkable for persistent nausea, vomiting.  CT chest also showed right-sided pleural effusion, pneumonia needing antibiotics General surgery, IR following  Assessment & Plan:   Principal Problem:   Acute cholecystitis Active Problems:   Type 2 diabetes mellitus with renal manifestations (HCC)   Essential hypertension   HLD (hyperlipidemia)   ESRD (end stage renal disease) (HCC)   Macrocytic anemia   RBBB   Blindness   S/P TAVR (transcatheter aortic valve replacement)   Anemia in chronic kidney disease   Secondary hyperparathyroidism of renal origin ([place])   PAD (peripheral artery disease) ([place])   DNR (do not resuscitate)   Pressure injury of skin   Acute cholecystitis: He was recently admitted from  1/11-03/2020 for acute cholecystitis managed with percutaneous cholecystectomy tube which was unintentionally dislodged and subsequently removed on 1/19 and was discharged with oral antibiotics.  After being discharged to nursing facility, he developed severe abdominal pain with associated nausea and vomiting.  On presentation she was afebrile, lab work showed worsening leukocytosis.  Right upper quadrant ultrasound showed distended gallbladder with cholelithiasis, sludge, pericholecystic fluid. Murphy sign was positive. IR, general surgery consulted.  Underwent cholecystostomy tube placement by IR on 1/24.    Blood cultures no growth till date.  Aerobic/anaerobic culture have not shown any growth, no organisms.general surgery recommended 10 days course of antibiotics. Patient was having consistent nausea/vomiting after the procedure.   X-ray of the abdomen done here did not show any bowel obstruction.  CT abdomen/pelvis done on 1/27 showed dilated gallbladder.General surgery continued to recommend against surgical intervention, continue right upper quadrant drain. CT abdomen/pelvis with contrast did not show any acute intra-abdominal pelvic findings He is having bowel movements. Nausea, vomiting, abdominal pain better today  ESRD: On dialysis on MWF.  Nephrology following for dialysis  Bilateral pleural effusion/Right-sided pneumonia: Chest x-ray showed  to large RIGHT and small LEFT pleural effusions with associated RIGHT basilar atelectasis .  Currently on room air.  Underwent right sided  ultrasound-guided thoracentesis with removal of 1.1 L of dark red fluid.no organisms seen on the pleural fluid.,  Cultures, cytology pending .  CT imaging showed pneumonia on the right side.  He is having productive cough.  Currently on vancomycin ,cefepime.  Leukocytosis has improved.  We will check a sputum culture  Chest pain :complaint of chest pain earlier but none today.   Troponin mildly elevated at 28 likely  secondary to demand ischemia from ESRD  Paroxysmal A. fib:  Currently in normal sinus rhythm.  Not on anticoagulation due to concern for GI bleed.  On low-dose metoprolol for rate control.  Monitor on telemetry  Non-insulin-dependent diabetes type 2: Recent A1c of 7%.  Monitor blood sugars.  Currently on sliding scale insulin.  On gabapentin for diabetic neuropathy.  History of glaucoma: Continue eyedrops  Parkinson's disease: Continue Sinemet.  Delirium precautions  History of peripheral vascular disease : status post left BKA.  Takes Plavix , continued  History of urinary retention: On last admission Foley was placed and he was discharged with Foley.  Continue Foley catheter for now.  We recommend to follow-up with urology as an outpatient  Macrocytic anemia/esophageal candidiasis: Associated with ESRD.  No evidence of bleeding.  Hemoglobin stable.  He underwent EGD on last admission.  Biopsies showed candidiasis.  He has been started on 7 days course of fluconazole.    Hyponatremia: Likely from volume overload.  Continue monitoring.  Volume management as per dialysis.  Potassium supplemented for mild hypokalemia today  Hiccups: Continue Thorazine as needed          DVT prophylaxis:Heparin [place] Code Status: DNR Family Communication:: Discussed with  daughter on phone on 1/30 Patient status: Inpatient  Dispo: The patient is from: Skilled nursing facility              Anticipated d/c is to: Skilled nursing facility              Anticipated d/c date is: In next 2-3 days  Consultants: IR, general surgery, nephrology  Procedures: None  Antimicrobials:  Anti-infectives (From admission, onward)    Start     Dose/Rate Route Frequency Ordered Stop   04/03/21 1800  ceFEPIme (MAXIPIME) 2 g in sodium chloride 0.9 % 100 mL IVPB        2 g 200 mL/hr over 30 Minutes Intravenous Every M-W-F (1800) 04/03/21 0942     04/03/21 1200  vancomycin (VANCOREADY) IVPB 750 mg/150 mL        750  mg 150 mL/hr over 60 Minutes Intravenous Every M-W-F (Hemodialysis) 04/02/21 0934     04/02/21 1800  fluconazole (DIFLUCAN) IVPB 100 mg        100 mg 50 mL/hr over 60 Minutes Intravenous Every 24 hours 04/01/21 1504     04/02/21 1700  ceFEPIme (MAXIPIME) 1 g in sodium chloride 0.9 % 100 mL IVPB  Status:  Discontinued        1 g 200 mL/hr over 30 Minutes Intravenous Every 24 hours 04/02/21 1509 04/03/21 0942   04/02/21 0945  vancomycin (VANCOREADY) IVPB 750 mg/150 mL        750 mg 150 mL/hr over 60 Minutes Intravenous  Once 04/02/21 0853 04/02/21 1158   03/31/21 1200  fluconazole (DIFLUCAN) IVPB 100 mg  Status:  Discontinued        100 mg 50 mL/hr over 60 Minutes Intravenous Every 24 hours 03/31/21 1112 04/01/21 1504   03/30/21 1245  fluconazole (DIFLUCAN) tablet 100 mg  Status:  Discontinued        100 mg Oral Daily 03/30/21 1157 03/31/21 1112   03/28/21 1645  cefOXitin (MEFOXIN) 2 g in sodium chloride 0.9 % 100 mL IVPB       Note to Pharmacy: Please send to tube station 17 radiology- for procedure   2 g 200 mL/hr over 30 Minutes Intravenous  Once 03/28/21 1550 03/28/21 1720   03/27/21 1645  vancomycin (VANCOREADY) IVPB 1500 mg/300 mL  1,500 mg 150 mL/hr over 120 Minutes Intravenous  Once 03/27/21 1632 03/27/21 1942   03/27/21 1630  piperacillin-tazobactam (ZOSYN) IVPB 2.25 g  Status:  Discontinued        2.25 g 100 mL/hr over 30 Minutes Intravenous Every 8 hours 03/27/21 1617 04/02/21 0829       Subjective: Patient seen and examined at the bedside this morning.  Hemodynamically stable.  He feels better today.  Nausea, vomiting, abdomen pain symptoms are better.  Objective: Vitals:   04/03/21 1000 04/03/21 1015 04/03/21 1030 04/03/21 1100  BP: (!) 148/51 (!) 96/49 (!) 111/52 105/62  Pulse:      Resp: 11 (!) 7 10 11   Temp:      TempSrc:      SpO2:      Weight:      Height:       No intake or output data in the 24 hours ending 04/03/21 1139  Filed Weights    04/02/21 0500 04/03/21 0642 04/03/21 0948  Weight: 66.7 kg 68.5 kg 68.4 kg    Examination:  General exam: Very deconditioned, chronically looking HEENT: Legally blind Respiratory system: Crackles on the right side Cardiovascular system: S1 & S2 heard, RRR.  Gastrointestinal system: Abdomen is nondistended, soft and nontender.  Right upper quadrant drain with sanguinous output Central nervous system: Alert and oriented Extremities: Left BKA, AV fistula on the left upper extremity Skin: No rashes, no ulcers,no icterus    Data Reviewed: I have personally reviewed following labs and imaging studies  CBC: Recent Labs  Lab 03/30/21 0241 03/31/21 0836 04/01/21 0154 04/02/21 0041 04/03/21 0249  WBC 17.1* 13.7* 19.7* 23.7* 18.7*  NEUTROABS 14.7* 11.0* 16.9* 20.7* 15.9*  HGB 8.8* 9.5* 9.2* 8.3* 8.4*  HCT 25.7* 28.6* 26.4* 24.8* 25.3*  MCV 101.6* 101.1* 100.0 102.1* 102.8*  PLT 453* 506* 501* 466* 673*   Basic Metabolic Panel: Recent Labs  Lab 03/28/21 0319 03/29/21 0346 03/30/21 0241 04/01/21 0154 04/01/21 1148 04/02/21 0041 04/03/21 0249  NA 128*   < > 132* 134* 134* 134* 135  K 3.9   < > 4.1 3.7 3.9 3.6 3.4*  CL 94*   < > 95* 95* 97* 97* 97*  CO2 25   < > 23 23 23 22  20*  GLUCOSE 92   < > 76 104* 108* 106* 87  BUN 37*   < > 29* 21 23 25* 33*  CREATININE 6.13*   < > 5.55* 4.85* 5.38* 5.96* 7.25*  CALCIUM 8.2*   < > 9.1 8.8* 9.0 8.8* 8.9  PHOS 4.9*  --   --   --  4.9*  --   --    < > = values in this interval not displayed.   GFR: Estimated Creatinine Clearance: 6.8 mL/min (A) (by C-G formula based on SCr of 7.25 mg/dL (H)). Liver Function Tests: Recent Labs  Lab 03/27/21 1143 03/28/21 0319 03/31/21 1102 04/01/21 1148 04/02/21 0041  AST 35 122* 20  --  16  ALT 6 8 22   --  6  ALKPHOS 191* 409* 133*  --  212*  BILITOT 1.0 0.9 0.4  --  0.9  PROT 6.8 5.3* 7.2  --  5.6*  ALBUMIN 2.4* 1.7* 3.4* 2.0* 1.8*   Recent Labs  Lab 03/27/21 1143 03/31/21 1102  LIPASE  30 29   No results for input(s): AMMONIA in the last 168 hours. Coagulation Profile: Recent Labs  Lab 03/28/21 0319  INR 1.2   Cardiac Enzymes: No  results for input(s): CKTOTAL, CKMB, CKMBINDEX, TROPONINI in the last 168 hours. BNP (last 3 results) No results for input(s): PROBNP in the last 8760 hours. HbA1C: No results for input(s): HGBA1C in the last 72 hours. CBG: Recent Labs  Lab 03/31/21 1438 04/01/21 0821 04/01/21 1200 04/01/21 1552 04/01/21 2048  GLUCAP 102* 96 101* 104* 109*   Lipid Profile: No results for input(s): CHOL, HDL, LDLCALC, TRIG, CHOLHDL, LDLDIRECT in the last 72 hours. Thyroid Function Tests: No results for input(s): TSH, T4TOTAL, FREET4, T3FREE, THYROIDAB in the last 72 hours. Anemia Panel: No results for input(s): VITAMINB12, FOLATE, FERRITIN, TIBC, IRON, RETICCTPCT in the last 72 hours. Sepsis Labs: No results for input(s): PROCALCITON, LATICACIDVEN in the last 168 hours.  Recent Results (from the past 240 hour(s))  Resp Panel by RT-PCR (Flu A&B, Covid) Nasopharyngeal Swab     Status: None   Collection Time: 03/26/21 11:56 AM   Specimen: Nasopharyngeal Swab; Nasopharyngeal(NP) swabs in vial transport medium  Result Value Ref Range Status   SARS Coronavirus 2 by RT PCR NEGATIVE NEGATIVE Final    Comment: (NOTE) SARS-CoV-2 target nucleic acids are NOT DETECTED.  The SARS-CoV-2 RNA is generally detectable in upper respiratory specimens during the acute phase of infection. The lowest concentration of SARS-CoV-2 viral copies this assay can detect is 138 copies/mL. A negative result does not preclude SARS-Cov-2 infection and should not be used as the sole basis for treatment or other patient management decisions. A negative result may occur with  improper specimen collection/handling, submission of specimen other than nasopharyngeal swab, presence of viral mutation(s) within the areas targeted by this assay, and inadequate number of  viral copies(<138 copies/mL). A negative result must be combined with clinical observations, patient history, and epidemiological information. The expected result is Negative.  Fact Sheet for Patients:  EntrepreneurPulse.com.au  Fact Sheet for Healthcare Providers:  IncredibleEmployment.be  This test is no t yet approved or cleared by the [place] FDA and  has been authorized for detection and/or diagnosis of SARS-CoV-2 by FDA under an Emergency Use Authorization (EUA). This EUA will remain  in effect (meaning this test can be used) for the duration of the COVID-19 declaration under Section 564(b)(1) of the Act, 21 U.S.C.section 360bbb-3(b)(1), unless the authorization is terminated  or revoked sooner.       Influenza A by PCR NEGATIVE NEGATIVE Final   Influenza B by PCR NEGATIVE NEGATIVE Final    Comment: (NOTE) The Xpert Xpress SARS-CoV-2/FLU/RSV plus assay is intended as an aid in the diagnosis of influenza from Nasopharyngeal swab specimens and should not be used as a sole basis for treatment. Nasal washings and aspirates are unacceptable for Xpert Xpress SARS-CoV-2/FLU/RSV testing.  Fact Sheet for Patients: EntrepreneurPulse.com.au  Fact Sheet for Healthcare Providers: IncredibleEmployment.be  This test is not yet approved or cleared by the [place] FDA and has been authorized for detection and/or diagnosis of SARS-CoV-2 by FDA under an Emergency Use Authorization (EUA). This EUA will remain in effect (meaning this test can be used) for the duration of the COVID-19 declaration under Section 564(b)(1) of the Act, 21 U.S.C. section 360bbb-3(b)(1), unless the authorization is terminated or revoked.  Performed at [facility], [place] [address], [place], [place] [address]   Resp Panel by RT-PCR (Flu A&B, Covid) Nasopharyngeal Swab     Status: None   Collection Time: 03/27/21  2:10 PM    Specimen: Nasopharyngeal Swab; Nasopharyngeal(NP) swabs in vial transport medium  Result Value Ref Range Status  SARS Coronavirus 2 by RT PCR NEGATIVE NEGATIVE Final    Comment: (NOTE) SARS-CoV-2 target nucleic acids are NOT DETECTED.  The SARS-CoV-2 RNA is generally detectable in upper respiratory specimens during the acute phase of infection. The lowest concentration of SARS-CoV-2 viral copies this assay can detect is 138 copies/mL. A negative result does not preclude SARS-Cov-2 infection and should not be used as the sole basis for treatment or other patient management decisions. A negative result may occur with  improper specimen collection/handling, submission of specimen other than nasopharyngeal swab, presence of viral mutation(s) within the areas targeted by this assay, and inadequate number of viral copies(<138 copies/mL). A negative result must be combined with clinical observations, patient history, and epidemiological information. The expected result is Negative.  Fact Sheet for Patients:  EntrepreneurPulse.com.au  Fact Sheet for Healthcare Providers:  IncredibleEmployment.be  This test is no t yet approved or cleared by the [place] FDA and  has been authorized for detection and/or diagnosis of SARS-CoV-2 by FDA under an Emergency Use Authorization (EUA). This EUA will remain  in effect (meaning this test can be used) for the duration of the COVID-19 declaration under Section 564(b)(1) of the Act, 21 U.S.C.section 360bbb-3(b)(1), unless the authorization is terminated  or revoked sooner.       Influenza A by PCR NEGATIVE NEGATIVE Final   Influenza B by PCR NEGATIVE NEGATIVE Final    Comment: (NOTE) The Xpert Xpress SARS-CoV-2/FLU/RSV plus assay is intended as an aid in the diagnosis of influenza from Nasopharyngeal swab specimens and should not be used as a sole basis for treatment. Nasal washings and aspirates are  unacceptable for Xpert Xpress SARS-CoV-2/FLU/RSV testing.  Fact Sheet for Patients: EntrepreneurPulse.com.au  Fact Sheet for Healthcare Providers: IncredibleEmployment.be  This test is not yet approved or cleared by the [place] FDA and has been authorized for detection and/or diagnosis of SARS-CoV-2 by FDA under an Emergency Use Authorization (EUA). This EUA will remain in effect (meaning this test can be used) for the duration of the COVID-19 declaration under Section 564(b)(1) of the Act, 21 U.S.C. section 360bbb-3(b)(1), unless the authorization is terminated or revoked.  Performed at [facility], [place] [address] [place], [place] [address]   Culture, blood (routine x 2)     Status: None   Collection Time: 03/27/21  4:03 PM   Specimen: BLOOD  Result Value Ref Range Status   Specimen Description BLOOD BLOOD RIGHT ARM  Final   Special Requests   Final    BOTTLES DRAWN AEROBIC AND ANAEROBIC Blood Culture results may not be optimal due to an excessive volume of blood received in culture bottles   Culture   Final    NO GROWTH 5 DAYS Performed at [facility], [place] [address], [place], [place] [address]    Report Status 04/01/2021 FINAL  Final  Culture, blood (routine x 2)     Status: None   Collection Time: 03/27/21  6:49 PM   Specimen: BLOOD RIGHT HAND  Result Value Ref Range Status   Specimen Description BLOOD RIGHT HAND  Final   Special Requests   Final    BOTTLES DRAWN AEROBIC AND ANAEROBIC Blood Culture adequate volume   Culture   Final    NO GROWTH 5 DAYS Performed at [facility], [place] [address] [place], [place] [address]    Report Status 04/01/2021 FINAL  Final  Aerobic/Anaerobic Culture w Gram Stain (surgical/deep wound)     Status: None  Collection Time: 03/28/21  5:10 PM   Specimen: Abscess  Result Value Ref Range Status   Specimen Description ABSCESS  Final   Special Requests GALLBLADDER  Final    Gram Stain   Final    FEW WBC PRESENT,BOTH PMN AND MONONUCLEAR NO ORGANISMS SEEN    Culture   Final    No growth aerobically or anaerobically. Performed at [facility], [place] [address], [place], [place] [address]    Report Status 04/02/2021 FINAL  Final  Body fluid culture w Gram Stain     Status: None (Preliminary result)   Collection Time: 04/01/21  1:37 PM   Specimen: Lung, Right Lower Lobe; Pleural Fluid  Result Value Ref Range Status   Specimen Description PLEURAL FLUID  Final   Special Requests LUNG RIGHT LOWER LOBE  Final   Gram Stain   Final    RARE WBC PRESENT,BOTH PMN AND MONONUCLEAR NO ORGANISMS SEEN    Culture   Final    NO GROWTH 2 DAYS Performed at [facility], [place] [address] [place], [place] [address]    Report Status PENDING  Incomplete         Radiology Studies: CT CHEST W CONTRAST  Result Date: 04/02/2021 CLINICAL DATA:  Acute abdominal pain.  Pneumonia. EXAM: CT CHEST, ABDOMEN, AND PELVIS WITH CONTRAST TECHNIQUE: Multidetector CT imaging of the chest, abdomen and pelvis was performed following the standard protocol during bolus administration of intravenous contrast. RADIATION DOSE REDUCTION: This exam was performed according to the departmental dose-optimization program which includes automated exposure control, adjustment of the mA and/or kV according to patient size and/or use of iterative reconstruction technique. CONTRAST:  118mL OMNIPAQUE IOHEXOL 350 MG/ML SOLN COMPARISON:  CT abdomen and pelvis 03/31/2021. CT angiogram chest abdomen and pelvis 12/01/2019. FINDINGS: CT CHEST FINDINGS Cardiovascular: The heart is mildly enlarged. There is no pericardial effusion. Aorta is normal in size. Patient is status post TAVR. There are atherosclerotic calcifications of the aorta and coronary arteries. Mediastinum/Nodes: No enlarged mediastinal, hilar, or axillary lymph nodes. Thyroid gland, trachea, and esophagus demonstrate no significant findings.  Lungs/Pleura: There are small bilateral pleural effusions, right greater than left. Right pleural effusion has decreased from the prior examination. Right pleural effusion is likely partially loculated posteriorly. There is also small amount of loculated fluid in the right major fissure. There are patchy ground-glass and airspace opacities in the right upper lobe and right lower lobe. There is atelectasis in the left lower lobe. There is no evidence for pneumothorax. There is some secretions in the trachea. Trachea and central airways appear patent. There is a stable 3 mm nodule in the left upper lobe image 4/52. No new pulmonary nodules are identified. Musculoskeletal: No acute fractures. Degenerative changes affect the spine. CT ABDOMEN PELVIS FINDINGS Hepatobiliary: Percutaneous cholecystostomy tube is again seen. The gallbladder is dilated similar to the prior study. There is a small amount of air in the gallbladder. Layering small stones are unchanged. Heterogeneous hyperdensity throughout the gallbladder likely represents hemorrhagic products, also unchanged. There is no biliary ductal dilatation. No focal liver lesions are seen. Pancreas: Unremarkable. No pancreatic ductal dilatation or surrounding inflammatory changes. Spleen: Normal in size without focal abnormality. Adrenals/Urinary Tract: 2.5 cm left adrenal nodule is indeterminate and unchanged. Right adrenal gland is within normal limits. The bladder is decompressed by Foley catheter. There is no hydronephrosis. There is a cortical hypodensity in the superior pole the left kidney which is too small to characterize, likely cysts.  Stomach/Bowel: Appendix appears normal. No evidence of bowel wall thickening, distention, or inflammatory changes. There is scattered air-fluid levels throughout the colon. The stomach is completely decompressed and gastric wall thickening can not be excluded. Vascular/Lymphatic: Aortic atherosclerosis. No enlarged abdominal or  pelvic lymph nodes. Reproductive: Prostate radiotherapy seeds are present. Other: There is a small fat containing umbilical hernia. There is trace ascites, decreased from prior. There is mild body wall edema which is similar to the prior study. Musculoskeletal: No acute or significant osseous findings. IMPRESSION: 1. Small right pleural effusion has decreased in size. This effusion is partially loculated posteriorly and within the major fissure. Loculation may be secondary to chronicity, hemorrhage or malignancy. 2. Patchy ground-glass and airspace opacities in the right upper lobe and right lower lobe most consistent with pneumonia. 3. Stable small left pleural effusion. 4. The gallbladder is dilated. Cholecystostomy tube is in place. Gallbladder air, hemorrhagic products and gallstones are present. Appearance is unchanged from the prior study. 5. Stable body wall edema and trace ascites. 6. Stable indeterminate left adrenal nodule. 7. Stable 3 mm left upper lobe pulmonary nodule. No follow-up needed if patient is low-risk. Non-contrast chest CT can be considered in 12 months if patient is high-risk. This recommendation follows the consensus statement: Guidelines for Management of Incidental Pulmonary Nodules Detected on CT Images: From the Fleischner Society 2017; Radiology 2017; 284:228-243. Electronically Signed   By: Ronney Asters M.D.   On: 04/02/2021 21:11   CT ABDOMEN PELVIS W CONTRAST  Result Date: 04/02/2021 CLINICAL DATA:  Acute abdominal pain.  Pneumonia. EXAM: CT CHEST, ABDOMEN, AND PELVIS WITH CONTRAST TECHNIQUE: Multidetector CT imaging of the chest, abdomen and pelvis was performed following the standard protocol during bolus administration of intravenous contrast. RADIATION DOSE REDUCTION: This exam was performed according to the departmental dose-optimization program which includes automated exposure control, adjustment of the mA and/or kV according to patient size and/or use of iterative  reconstruction technique. CONTRAST:  176mL OMNIPAQUE IOHEXOL 350 MG/ML SOLN COMPARISON:  CT abdomen and pelvis 03/31/2021. CT angiogram chest abdomen and pelvis 12/01/2019. FINDINGS: CT CHEST FINDINGS Cardiovascular: The heart is mildly enlarged. There is no pericardial effusion. Aorta is normal in size. Patient is status post TAVR. There are atherosclerotic calcifications of the aorta and coronary arteries. Mediastinum/Nodes: No enlarged mediastinal, hilar, or axillary lymph nodes. Thyroid gland, trachea, and esophagus demonstrate no significant findings. Lungs/Pleura: There are small bilateral pleural effusions, right greater than left. Right pleural effusion has decreased from the prior examination. Right pleural effusion is likely partially loculated posteriorly. There is also small amount of loculated fluid in the right major fissure. There are patchy ground-glass and airspace opacities in the right upper lobe and right lower lobe. There is atelectasis in the left lower lobe. There is no evidence for pneumothorax. There is some secretions in the trachea. Trachea and central airways appear patent. There is a stable 3 mm nodule in the left upper lobe image 4/52. No new pulmonary nodules are identified. Musculoskeletal: No acute fractures. Degenerative changes affect the spine. CT ABDOMEN PELVIS FINDINGS Hepatobiliary: Percutaneous cholecystostomy tube is again seen. The gallbladder is dilated similar to the prior study. There is a small amount of air in the gallbladder. Layering small stones are unchanged. Heterogeneous hyperdensity throughout the gallbladder likely represents hemorrhagic products, also unchanged. There is no biliary ductal dilatation. No focal liver lesions are seen. Pancreas: Unremarkable. No pancreatic ductal dilatation or surrounding inflammatory changes. Spleen: Normal in size without focal abnormality. Adrenals/Urinary Tract: 2.5  cm left adrenal nodule is indeterminate and unchanged. Right  adrenal gland is within normal limits. The bladder is decompressed by Foley catheter. There is no hydronephrosis. There is a cortical hypodensity in the superior pole the left kidney which is too small to characterize, likely cysts. Stomach/Bowel: Appendix appears normal. No evidence of bowel wall thickening, distention, or inflammatory changes. There is scattered air-fluid levels throughout the colon. The stomach is completely decompressed and gastric wall thickening can not be excluded. Vascular/Lymphatic: Aortic atherosclerosis. No enlarged abdominal or pelvic lymph nodes. Reproductive: Prostate radiotherapy seeds are present. Other: There is a small fat containing umbilical hernia. There is trace ascites, decreased from prior. There is mild body wall edema which is similar to the prior study. Musculoskeletal: No acute or significant osseous findings. IMPRESSION: 1. Small right pleural effusion has decreased in size. This effusion is partially loculated posteriorly and within the major fissure. Loculation may be secondary to chronicity, hemorrhage or malignancy. 2. Patchy ground-glass and airspace opacities in the right upper lobe and right lower lobe most consistent with pneumonia. 3. Stable small left pleural effusion. 4. The gallbladder is dilated. Cholecystostomy tube is in place. Gallbladder air, hemorrhagic products and gallstones are present. Appearance is unchanged from the prior study. 5. Stable body wall edema and trace ascites. 6. Stable indeterminate left adrenal nodule. 7. Stable 3 mm left upper lobe pulmonary nodule. No follow-up needed if patient is low-risk. Non-contrast chest CT can be considered in 12 months if patient is high-risk. This recommendation follows the consensus statement: Guidelines for Management of Incidental Pulmonary Nodules Detected on CT Images: From the Fleischner Society 2017; Radiology 2017; 284:228-243. Electronically Signed   By: Ronney Asters M.D.   On: 04/02/2021 21:11    DG CHEST PORT 1 VIEW  Result Date: 04/02/2021 CLINICAL DATA:  Shortness of breath.  Follow-up exam. EXAM: PORTABLE CHEST 1 VIEW COMPARISON:  03/24/2021 and older studies. FINDINGS: Patchy airspace opacity has increased at the right lung base, extending to the right mid lung. Mild linear atelectasis or scarring is noted at the medial left lung base, stable. Remainder of the lungs is clear. Cardiac silhouette normal in size. Stable endovascular aortic valve replacement. No pneumothorax. Stable pigtail catheter in the right upper quadrant. IMPRESSION: 1. Interval increase in airspace opacity at the right lung base from the previous day's exam, which may reflect reaccumulation of fluid and associated atelectasis, with infection also in the differential diagnosis. 2. No other change.  No pneumothorax. Electronically Signed   By: Lajean Manes M.D.   On: 04/02/2021 08:30   DG Chest Port 1 View  Result Date: 04/01/2021 CLINICAL DATA:  Status post thoracentesis.  Right pleural effusion. EXAM: PORTABLE CHEST 1 VIEW COMPARISON:  03/27/2021. FINDINGS: There is decreased opacity at the right lung base with most of the right hemidiaphragm now defined. Residual opacity at the right base is consistent with atelectasis, likely with a small residual pleural effusion. Remainder of the lungs is clear. No pneumothorax. A pigtail catheter projects in the right upper quadrant. Cardiac silhouette is normal in size. IMPRESSION: 1. Decreased right lung base opacity following thoracentesis. Residual opacity consistent with atelectasis and a small effusion. 2. Remainder of the lungs is clear. 3. No pneumothorax. Electronically Signed   By: Lajean Manes M.D.   On: 04/01/2021 13:37   US THORACENTESIS ASP PLEURAL SPACE W/IMG GUIDE  Result Date: 04/01/2021 INDICATION: Patient with history of end-stage renal disease, AFib, acute cholecystitis status post percutaneous cholecystostomy placement now with worsening  bilateral pleural  effusions. Request to IR for diagnostic and therapeutic right thoracentesis EXAM: ULTRASOUND GUIDED RIGHT THORACENTESIS MEDICATIONS: 7 mL 1% lidocaine COMPLICATIONS: None immediate. PROCEDURE: An ultrasound guided thoracentesis was thoroughly discussed with the patient and questions answered. The benefits, risks, alternatives and complications were also discussed. The patient understands and wishes to proceed with the procedure. Written consent was obtained. Ultrasound was performed to localize and mark an adequate pocket of fluid in the RIGHT chest. The area was then prepped and draped in the normal sterile fashion. 1% Lidocaine was used for local anesthesia. Under ultrasound guidance a 6 Fr Safe-T-Centesis catheter was introduced. Thoracentesis was performed. The catheter was removed and a dressing applied. FINDINGS: A total of approximately 1.1 L of dark red fluid was removed. Samples were sent to the laboratory as requested by the clinical team. IMPRESSION: Successful ultrasound guided RIGHT thoracentesis yielding 1.1 L of pleural fluid. Read by Candiss Norse, PA-C Electronically Signed   By: Michaelle Birks M.D.   On: 04/01/2021 19:20        Scheduled Meds:  brimonidine  1 drop Right Eye TID   carbidopa-levodopa  1 tablet Oral TID   Chlorhexidine Gluconate Cloth  6 each Topical Q0600   cinacalcet  60 mg Oral Q M,W,F-HD   darbepoetin (ARANESP) injection - DIALYSIS  40 mcg Intravenous Q Wed-HD   dorzolamide-timolol  1 drop Right Eye BID   doxercalciferol  3 mcg Intravenous Q M,W,F-HD   gabapentin  100 mg Oral Daily   heparin injection (subcutaneous)  5,000 Units Subcutaneous Q8H   metoprolol tartrate  12.5 mg Oral BID   Netarsudil-Latanoprost  1 drop Right Eye QHS   pantoprazole  40 mg Oral Daily   pilocarpine  1 drop Right Eye QID   polyethylene glycol  17 g Oral Daily   potassium chloride  20 mEq Oral Once   senna  1 tablet Oral BID   sodium chloride flush  5 mL Intracatheter Q8H    Continuous Infusions:  sodium chloride     sodium chloride     sodium chloride 10 mL/hr at 04/02/21 0057   ceFEPime (MAXIPIME) IV     chlorproMAZINE (THORAZINE) IV Stopped (03/30/21 2350)   fluconazole (DIFLUCAN) IV 100 mg (04/02/21 1734)   vancomycin       LOS: 6 days       Shelly Coss, MD [facility] P1/30/2023, 11:39 AM

## 2021-04-03 NOTE — Progress Notes (Signed)
[facility] Progress Note   Subjective:   Reports nausea is better today, asking for breakfast. Also reports some intermittent sharp chest pains. Happen at rest and seem to be at random. No SOB, orthopnea, HA or dizziness.   Objective Vitals:   04/02/21 1741 04/02/21 2101 04/03/21 0642 04/03/21 0700  BP: (!) 110/37 (!) 117/50  137/64  Pulse: 75 68  67  Resp: 18 16  19   Temp: 97.9 F (36.6 C) 97.7 F (36.5 C)  98.1 F (36.7 C)  TempSrc: Oral Oral  Oral  SpO2: 100% 100%  99%  Weight:   68.5 kg   Height:       Physical Exam General: Elderly male, alert and in NAD Heart: RRR, no murmurs, rubs or gallops Lungs: CTA bilaterally without wheezing, rhonchi or rales Abdomen: Soft, non-distended, +BS. + percutaneous drain with bloody drainage. Foley draining clear yellow urine. 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   Liver Function Tests: Recent Labs  Lab 03/28/21 0319 03/31/21 1102 04/01/21 1148 04/02/21 0041  AST 122* 20  --  16  ALT 8 22  --  6  ALKPHOS 409* 133*  --  212*  BILITOT 0.9 0.4  --  0.9  PROT 5.3* 7.2  --  5.6*  ALBUMIN 1.7* 3.4* 2.0* 1.8*   Recent Labs  Lab 03/27/21 1143 03/31/21 1102  LIPASE 30 29   CBC: Recent Labs  Lab 03/30/21 0241 03/31/21 0836 04/01/21 0154 04/02/21 0041 04/03/21 0249  WBC 17.1* 13.7* 19.7* 23.7* 18.7*  NEUTROABS 14.7* 11.0* 16.9* 20.7* 15.9*  HGB 8.8* 9.5* 9.2* 8.3* 8.4*  HCT 25.7* 28.6* 26.4* 24.8*  25.3*  MCV 101.6* 101.1* 100.0 102.1* 102.8*  PLT 453* 506* 501* 466* 442*   Blood Culture    Component Value Date/Time   SDES PLEURAL FLUID 04/01/2021 1337   SPECREQUEST LUNG RIGHT LOWER LOBE 04/01/2021 1337   CULT  04/01/2021 1337    NO GROWTH < 24 HOURS Performed at [facility], [place] [address], [place], [place] [address]    REPTSTATUS PENDING 04/01/2021 1337    Cardiac Enzymes: No results for input(s): CKTOTAL, CKMB, CKMBINDEX, TROPONINI in the last 168 hours. CBG: Recent Labs  Lab 03/31/21 1438 04/01/21 0821 04/01/21 1200 04/01/21 1552 04/01/21 2048  GLUCAP 102* 96 101* 104* 109*   Iron Studies: No results for input(s): IRON, TIBC, TRANSFERRIN, FERRITIN in the last 72 hours. @lablastinr3 @ Studies/Results: CT CHEST W CONTRAST  Result Date: 04/02/2021 CLINICAL DATA:  Acute abdominal pain.  Pneumonia. EXAM: CT CHEST, ABDOMEN, AND PELVIS WITH CONTRAST TECHNIQUE: Multidetector CT imaging of the chest, abdomen and pelvis was performed following the standard protocol during bolus administration of intravenous contrast. RADIATION DOSE REDUCTION:  This exam was performed according to the departmental dose-optimization program which includes automated exposure control, adjustment of the mA and/or kV according to patient size and/or use of iterative reconstruction technique. CONTRAST:  130mL OMNIPAQUE IOHEXOL 350 MG/ML SOLN COMPARISON:  CT abdomen and pelvis 03/31/2021. CT angiogram chest abdomen and pelvis 12/01/2019. FINDINGS: CT CHEST FINDINGS Cardiovascular: The heart is mildly enlarged. There is no pericardial effusion. Aorta is normal in size. Patient is status post TAVR. There are atherosclerotic calcifications of the aorta and coronary arteries. Mediastinum/Nodes: No enlarged mediastinal, hilar, or axillary lymph nodes. Thyroid gland, trachea, and esophagus demonstrate no significant findings. Lungs/Pleura: There are small bilateral pleural effusions, right greater than left.  Right pleural effusion has decreased from the prior examination. Right pleural effusion is likely partially loculated posteriorly. There is also small amount of loculated fluid in the right major fissure. There are patchy ground-glass and airspace opacities in the right upper lobe and right lower lobe. There is atelectasis in the left lower lobe. There is no evidence for pneumothorax. There is some secretions in the trachea. Trachea and central airways appear patent. There is a stable 3 mm nodule in the left upper lobe image 4/52. No new pulmonary nodules are identified. Musculoskeletal: No acute fractures. Degenerative changes affect the spine. CT ABDOMEN PELVIS FINDINGS Hepatobiliary: Percutaneous cholecystostomy tube is again seen. The gallbladder is dilated similar to the prior study. There is a small amount of air in the gallbladder. Layering small stones are unchanged. Heterogeneous hyperdensity throughout the gallbladder likely represents hemorrhagic products, also unchanged. There is no biliary ductal dilatation. No focal liver lesions are seen. Pancreas: Unremarkable. No pancreatic ductal dilatation or surrounding inflammatory changes. Spleen: Normal in size without focal abnormality. Adrenals/Urinary Tract: 2.5 cm left adrenal nodule is indeterminate and unchanged. Right adrenal gland is within normal limits. The bladder is decompressed by Foley catheter. There is no hydronephrosis. There is a cortical hypodensity in the superior pole the left kidney which is too small to characterize, likely cysts. Stomach/Bowel: Appendix appears normal. No evidence of bowel wall thickening, distention, or inflammatory changes. There is scattered air-fluid levels throughout the colon. The stomach is completely decompressed and gastric wall thickening can not be excluded. Vascular/Lymphatic: Aortic atherosclerosis. No enlarged abdominal or pelvic lymph nodes. Reproductive: Prostate radiotherapy seeds are present. Other:  There is a small fat containing umbilical hernia. There is trace ascites, decreased from prior. There is mild body wall edema which is similar to the prior study. Musculoskeletal: No acute or significant osseous findings. IMPRESSION: 1. Small right pleural effusion has decreased in size. This effusion is partially loculated posteriorly and within the major fissure. Loculation may be secondary to chronicity, hemorrhage or malignancy. 2. Patchy ground-glass and airspace opacities in the right upper lobe and right lower lobe most consistent with pneumonia. 3. Stable small left pleural effusion. 4. The gallbladder is dilated. Cholecystostomy tube is in place. Gallbladder air, hemorrhagic products and gallstones are present. Appearance is unchanged from the prior study. 5. Stable body wall edema and trace ascites. 6. Stable indeterminate left adrenal nodule. 7. Stable 3 mm left upper lobe pulmonary nodule. No follow-up needed if patient is low-risk. Non-contrast chest CT can be considered in 12 months if patient is high-risk. This recommendation follows the consensus statement: Guidelines for Management of Incidental Pulmonary Nodules Detected on CT Images: From the Fleischner Society 2017; Radiology 2017; 284:228-243. Electronically Signed   By: Ronney Asters M.D.   On: 04/02/2021 21:11   CT ABDOMEN PELVIS W  CONTRAST  Result Date: 04/02/2021 CLINICAL DATA:  Acute abdominal pain.  Pneumonia. EXAM: CT CHEST, ABDOMEN, AND PELVIS WITH CONTRAST TECHNIQUE: Multidetector CT imaging of the chest, abdomen and pelvis was performed following the standard protocol during bolus administration of intravenous contrast. RADIATION DOSE REDUCTION: This exam was performed according to the departmental dose-optimization program which includes automated exposure control, adjustment of the mA and/or kV according to patient size and/or use of iterative reconstruction technique. CONTRAST:  147mL OMNIPAQUE IOHEXOL 350 MG/ML SOLN COMPARISON:   CT abdomen and pelvis 03/31/2021. CT angiogram chest abdomen and pelvis 12/01/2019. FINDINGS: CT CHEST FINDINGS Cardiovascular: The heart is mildly enlarged. There is no pericardial effusion. Aorta is normal in size. Patient is status post TAVR. There are atherosclerotic calcifications of the aorta and coronary arteries. Mediastinum/Nodes: No enlarged mediastinal, hilar, or axillary lymph nodes. Thyroid gland, trachea, and esophagus demonstrate no significant findings. Lungs/Pleura: There are small bilateral pleural effusions, right greater than left. Right pleural effusion has decreased from the prior examination. Right pleural effusion is likely partially loculated posteriorly. There is also small amount of loculated fluid in the right major fissure. There are patchy ground-glass and airspace opacities in the right upper lobe and right lower lobe. There is atelectasis in the left lower lobe. There is no evidence for pneumothorax. There is some secretions in the trachea. Trachea and central airways appear patent. There is a stable 3 mm nodule in the left upper lobe image 4/52. No new pulmonary nodules are identified. Musculoskeletal: No acute fractures. Degenerative changes affect the spine. CT ABDOMEN PELVIS FINDINGS Hepatobiliary: Percutaneous cholecystostomy tube is again seen. The gallbladder is dilated similar to the prior study. There is a small amount of air in the gallbladder. Layering small stones are unchanged. Heterogeneous hyperdensity throughout the gallbladder likely represents hemorrhagic products, also unchanged. There is no biliary ductal dilatation. No focal liver lesions are seen. Pancreas: Unremarkable. No pancreatic ductal dilatation or surrounding inflammatory changes. Spleen: Normal in size without focal abnormality. Adrenals/Urinary Tract: 2.5 cm left adrenal nodule is indeterminate and unchanged. Right adrenal gland is within normal limits. The bladder is decompressed by Foley catheter.  There is no hydronephrosis. There is a cortical hypodensity in the superior pole the left kidney which is too small to characterize, likely cysts. Stomach/Bowel: Appendix appears normal. No evidence of bowel wall thickening, distention, or inflammatory changes. There is scattered air-fluid levels throughout the colon. The stomach is completely decompressed and gastric wall thickening can not be excluded. Vascular/Lymphatic: Aortic atherosclerosis. No enlarged abdominal or pelvic lymph nodes. Reproductive: Prostate radiotherapy seeds are present. Other: There is a small fat containing umbilical hernia. There is trace ascites, decreased from prior. There is mild body wall edema which is similar to the prior study. Musculoskeletal: No acute or significant osseous findings. IMPRESSION: 1. Small right pleural effusion has decreased in size. This effusion is partially loculated posteriorly and within the major fissure. Loculation may be secondary to chronicity, hemorrhage or malignancy. 2. Patchy ground-glass and airspace opacities in the right upper lobe and right lower lobe most consistent with pneumonia. 3. Stable small left pleural effusion. 4. The gallbladder is dilated. Cholecystostomy tube is in place. Gallbladder air, hemorrhagic products and gallstones are present. Appearance is unchanged from the prior study. 5. Stable body wall edema and trace ascites. 6. Stable indeterminate left adrenal nodule. 7. Stable 3 mm left upper lobe pulmonary nodule. No follow-up needed if patient is low-risk. Non-contrast chest CT can be considered in 12 months if patient is  high-risk. This recommendation follows the consensus statement: Guidelines for Management of Incidental Pulmonary Nodules Detected on CT Images: From the Fleischner Society 2017; Radiology 2017; 284:228-243. Electronically Signed   By: Ronney Asters M.D.   On: 04/02/2021 21:11   DG CHEST PORT 1 VIEW  Result Date: 04/02/2021 CLINICAL DATA:  Shortness of  breath.  Follow-up exam. EXAM: PORTABLE CHEST 1 VIEW COMPARISON:  03/24/2021 and older studies. FINDINGS: Patchy airspace opacity has increased at the right lung base, extending to the right mid lung. Mild linear atelectasis or scarring is noted at the medial left lung base, stable. Remainder of the lungs is clear. Cardiac silhouette normal in size. Stable endovascular aortic valve replacement. No pneumothorax. Stable pigtail catheter in the right upper quadrant. IMPRESSION: 1. Interval increase in airspace opacity at the right lung base from the previous day's exam, which may reflect reaccumulation of fluid and associated atelectasis, with infection also in the differential diagnosis. 2. No other change.  No pneumothorax. Electronically Signed   By: Lajean Manes M.D.   On: 04/02/2021 08:30   DG Chest Port 1 View  Result Date: 04/01/2021 CLINICAL DATA:  Status post thoracentesis.  Right pleural effusion. EXAM: PORTABLE CHEST 1 VIEW COMPARISON:  03/27/2021. FINDINGS: There is decreased opacity at the right lung base with most of the right hemidiaphragm now defined. Residual opacity at the right base is consistent with atelectasis, likely with a small residual pleural effusion. Remainder of the lungs is clear. No pneumothorax. A pigtail catheter projects in the right upper quadrant. Cardiac silhouette is normal in size. IMPRESSION: 1. Decreased right lung base opacity following thoracentesis. Residual opacity consistent with atelectasis and a small effusion. 2. Remainder of the lungs is clear. 3. No pneumothorax. Electronically Signed   By: Lajean Manes M.D.   On: 04/01/2021 13:37   US THORACENTESIS ASP PLEURAL SPACE W/IMG GUIDE  Result Date: 04/01/2021 INDICATION: Patient with history of end-stage renal disease, AFib, acute cholecystitis status post percutaneous cholecystostomy placement now with worsening bilateral pleural effusions. Request to IR for diagnostic and therapeutic right thoracentesis EXAM:  ULTRASOUND GUIDED RIGHT THORACENTESIS MEDICATIONS: 7 mL 1% lidocaine COMPLICATIONS: None immediate. PROCEDURE: An ultrasound guided thoracentesis was thoroughly discussed with the patient and questions answered. The benefits, risks, alternatives and complications were also discussed. The patient understands and wishes to proceed with the procedure. Written consent was obtained. Ultrasound was performed to localize and mark an adequate pocket of fluid in the RIGHT chest. The area was then prepped and draped in the normal sterile fashion. 1% Lidocaine was used for local anesthesia. Under ultrasound guidance a 6 Fr Safe-T-Centesis catheter was introduced. Thoracentesis was performed. The catheter was removed and a dressing applied. FINDINGS: A total of approximately 1.1 L of dark red fluid was removed. Samples were sent to the laboratory as requested by the clinical team. IMPRESSION: Successful ultrasound guided RIGHT thoracentesis yielding 1.1 L of pleural fluid. Read by Candiss Norse, PA-C Electronically Signed   By: Michaelle Birks M.D.   On: 04/01/2021 19:20   Medications:  sodium chloride     sodium chloride     sodium chloride 10 mL/hr at 04/02/21 0057   ceFEPime (MAXIPIME) IV 1 g (04/02/21 1855)   chlorproMAZINE (THORAZINE) IV Stopped (03/30/21 2350)   fluconazole (DIFLUCAN) IV 100 mg (04/02/21 1734)   vancomycin      brimonidine  1 drop Right Eye TID   carbidopa-levodopa  1 tablet Oral TID   Chlorhexidine Gluconate Cloth  6 each Topical  Daily   Chlorhexidine Gluconate Cloth  6 each Topical Q0600   cinacalcet  60 mg Oral Q M,W,F-HD   darbepoetin (ARANESP) injection - DIALYSIS  40 mcg Intravenous Q Wed-HD   dorzolamide-timolol  1 drop Right Eye BID   doxercalciferol  3 mcg Intravenous Q M,W,F-HD   gabapentin  100 mg Oral Daily   heparin injection (subcutaneous)  5,000 Units Subcutaneous Q8H   metoprolol tartrate  12.5 mg Oral BID   Netarsudil-Latanoprost  1 drop Right Eye QHS   pantoprazole   40 mg Oral Daily   pilocarpine  1 drop Right Eye QID   polyethylene glycol  17 g Oral Daily   senna  1 tablet Oral BID   sodium chloride flush  5 mL Intracatheter Q8H    Dialysis Orders: [place] MWF  4h  400/600  66kg 2/2 bath  P2  AVF  Hep none  - sensipar 60 mg po tiw  - hectorol 3 ug tiw IV  - mircera 75 ug q 2 wks, just ordered  Assessment/Plan: Abdominal pain: recent admit for acute cholecystitis, S/P perc chole tube 1/12- 1/17 per IR, dc'd on 1/22. Readmitted now w/ abd pain again and is SP new perc chole tube by IR 1/24. Repeating [facility]. Per primary/[facility].  Atrial fib - recent onset, per primary ESRD - on HD MWF. Planned for HD today. Mild hypokalemia, will use 4K bath.  BP/volume  - No evidence of volume overload by exam. BP controlled. UF to EDW as tolerated.   Anemia ckd  - Hb 8.4- declined over the weekend. started ESA last admit darbe 40 weekly, will continue.  Metabolic bone disease -  changed auryxia to Renvela binders 2 ac last admit, phos 4.9.  Corrected calcium is slightly high. Continue sensipar, hectorol is on hold. Low calcium bath with dialysis.  Nutrition - Albumin <2. Protein supps ordered. Dys 3 diet  Parkinson's disease-per primary DMT2-per primary DNR  Anice Paganini, PA-C 04/03/2021, 8:21 AM  [facility] Pager: [phone]

## 2021-04-03 NOTE — Progress Notes (Signed)
Pharmacy Antibiotic Note  Elijah White is a 81 y.o. male admitted on 03/27/2021 with sepsis. Patient presented with abdominal pain, nausea, and vomiting following recent admission for acute cholecystitis. RUQS this admit revealed distended gallbladder with cholelithiasis, sludge, and pericholecystic fluid. Cholecstostomy tube placed by IR 1/24, and patient has experienced severe pain from tube entry site. Patient also underwent thoracentesis 1/29 for bilateral pleural effusions, with 1.1 L dark red fluid removed. Patient continues to have fever and leukocytosis and per conversation with Dr. Tawanna Solo, there is now greater concern for PNA/parapneumonic effusion.  Pharmacy has been consulted for vancomycin dosing.  Of note, patient is ESRD and receives HD MWF. Patient was loaded with Vancomycin 1,500 mg in the ED 1/23. Since that time, patient has had two HD sessions (1/25, 1/27) lasting 3h and 2.5h respectively, with a BFR of 400 for most of each session. Calculated level at this time is ~12, as such patient will receive a smaller load.   PreHD vanc level came back at 23 today and it's in range. We will continue with the current dose. Plan for regular HD schedule today.   Plan: Vancomycin 750 mg IV with HD MWF  Cefepime 2g IV MWF F/U mrsa PCR  Height: 5\' 5"  (165.1 cm) Weight: 68.5 kg (151 lb 0.2 oz) IBW/kg (Calculated) : 61.5  Temp (24hrs), Avg:98 F (36.7 C), Min:97.7 F (36.5 C), Max:98.1 F (36.7 C)  Recent Labs  Lab 03/30/21 0241 03/31/21 0836 04/01/21 0154 04/01/21 1148 04/02/21 0041 04/03/21 0249  WBC 17.1* 13.7* 19.7*  --  23.7* 18.7*  CREATININE 5.55*  --  4.85* 5.38* 5.96* 7.25*  VANCORANDOM  --   --   --   --   --  23     Estimated Creatinine Clearance: 6.8 mL/min (A) (by C-G formula based on SCr of 7.25 mg/dL (H)).    No Known Allergies  Antimicrobials this admission: Vancomycin 1/23 x1, Vancomycin 1/29>> Cefepime 1/29>> Fluconazole 1/26>>  Zosyn  1/23>>1/29  Microbiology results: COVID/FLU PCR: negative  1/23 Bcx: negative-final  1/24 gallbladder abscess culture: ngtdF 1/28 pleural fluid cx: ngtd 1/28 Bcx: sent   Onnie Boer, PharmD, BCIDP, AAHIVP, CPP Infectious Disease Pharmacist 04/03/2021 9:48 AM

## 2021-04-04 LAB — CBC WITH DIFFERENTIAL/PLATELET
Abs Immature Granulocytes: 0.1 10*3/uL — ABNORMAL HIGH (ref 0.00–0.07)
Basophils Absolute: 0.1 10*3/uL (ref 0.0–0.1)
Basophils Relative: 0 %
Eosinophils Absolute: 0.3 10*3/uL (ref 0.0–0.5)
Eosinophils Relative: 2 %
HCT: 25.8 % — ABNORMAL LOW (ref 39.0–52.0)
Hemoglobin: 8.4 g/dL — ABNORMAL LOW (ref 13.0–17.0)
Immature Granulocytes: 1 %
Lymphocytes Relative: 4 %
Lymphs Abs: 0.6 10*3/uL — ABNORMAL LOW (ref 0.7–4.0)
MCH: 33.5 pg (ref 26.0–34.0)
MCHC: 32.6 g/dL (ref 30.0–36.0)
MCV: 102.8 fL — ABNORMAL HIGH (ref 80.0–100.0)
Monocytes Absolute: 1 10*3/uL (ref 0.1–1.0)
Monocytes Relative: 6 %
Neutro Abs: 13.5 10*3/uL — ABNORMAL HIGH (ref 1.7–7.7)
Neutrophils Relative %: 87 %
Platelets: 489 10*3/uL — ABNORMAL HIGH (ref 150–400)
RBC: 2.51 MIL/uL — ABNORMAL LOW (ref 4.22–5.81)
RDW: 15.3 % (ref 11.5–15.5)
WBC: 15.5 10*3/uL — ABNORMAL HIGH (ref 4.0–10.5)
nRBC: 0.1 % (ref 0.0–0.2)

## 2021-04-04 LAB — BASIC METABOLIC PANEL
Anion gap: 13 (ref 5–15)
BUN: 21 mg/dL (ref 8–23)
CO2: 23 mmol/L (ref 22–32)
Calcium: 8.6 mg/dL — ABNORMAL LOW (ref 8.9–10.3)
Chloride: 101 mmol/L (ref 98–111)
Creatinine, Ser: 5.24 mg/dL — ABNORMAL HIGH (ref 0.61–1.24)
GFR, Estimated: 10 mL/min — ABNORMAL LOW (ref >60)
Glucose, Bld: 115 mg/dL — ABNORMAL HIGH (ref 70–99)
Potassium: 3.9 mmol/L (ref 3.5–5.1)
Sodium: 137 mmol/L (ref 135–145)

## 2021-04-04 LAB — BODY FLUID CULTURE W GRAM STAIN: Culture: NO GROWTH

## 2021-04-04 MED ORDER — CHLORPROMAZINE HCL 25 MG PO TABS
25.0000 mg | ORAL_TABLET | Freq: Three times a day (TID) | ORAL | Status: DC | PRN
  Filled 2021-04-04: qty 1

## 2021-04-04 MED ORDER — ATORVASTATIN CALCIUM 10 MG PO TABS
10.0000 mg | ORAL_TABLET | Freq: Every morning | ORAL | Status: DC
  Administered 2021-04-04 – 2021-04-05 (×2): 10 mg via ORAL
  Filled 2021-04-04 (×2): qty 1

## 2021-04-04 MED ORDER — HYDROCORTISONE 1 % EX CREA
TOPICAL_CREAM | CUTANEOUS | Status: DC | PRN
  Filled 2021-04-04: qty 28

## 2021-04-04 MED ORDER — HYDROCORTISONE 1 % EX CREA
TOPICAL_CREAM | Freq: Three times a day (TID) | CUTANEOUS | Status: DC | PRN
  Filled 2021-04-04: qty 28

## 2021-04-04 MED ORDER — CLOPIDOGREL BISULFATE 75 MG PO TABS
75.0000 mg | ORAL_TABLET | Freq: Every day | ORAL | Status: DC
  Administered 2021-04-04 – 2021-04-05 (×2): 75 mg via ORAL
  Filled 2021-04-04 (×2): qty 1

## 2021-04-04 MED ORDER — AMOXICILLIN-POT CLAVULANATE 500-125 MG PO TABS
500.0000 mg | ORAL_TABLET | ORAL | Status: DC
  Administered 2021-04-04: 500 mg via ORAL
  Filled 2021-04-04 (×2): qty 1

## 2021-04-04 MED ORDER — CHLORHEXIDINE GLUCONATE CLOTH 2 % EX PADS
6.0000 | MEDICATED_PAD | Freq: Every day | CUTANEOUS | Status: DC
  Administered 2021-04-04 – 2021-04-05 (×2): 6 via TOPICAL

## 2021-04-04 MED ORDER — DIPHENHYDRAMINE HCL 25 MG PO CAPS
25.0000 mg | ORAL_CAPSULE | Freq: Four times a day (QID) | ORAL | Status: DC | PRN
  Administered 2021-04-04 – 2021-04-05 (×2): 25 mg via ORAL
  Filled 2021-04-04 (×2): qty 1

## 2021-04-04 NOTE — Progress Notes (Signed)
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  She was recently admitted from 1/11-03/2020 for acute cholecystitis managed with percutaneous cholecystectomy tube which was unintentionally dislodged and subsequently removed on 1/19 and was discharged with oral antibiotics.  After being discharged to nursing facility, he developed severe abdominal pain with associated nausea and vomiting.  On presentation ,he was afebrile, lab work showed worsening leukocytosis.  Right upper quadrant ultrasound showed distended gallbladder with cholelithiasis, sludge, pericholecystic fluid.  Murphy sign was positive.  IR, general surgery consulted.  Underwent cholecystostomy tube placement by IR on 1/24.  Cultures have not shown any growth.  Hospital course remarkable for persistent nausea, vomiting.  CT chest also showed right-sided pleural effusion, pneumonia needing antibiotics General surgery, IR were following.  Overall condition is looking better now.  Plan for discharge to SNF in next 1 to 2 days  Assessment & Plan:   Principal Problem:   Acute cholecystitis Active Problems:   Type 2 diabetes mellitus with renal manifestations (HCC)   Essential hypertension   HLD (hyperlipidemia)   ESRD (end stage renal disease) (HCC)   Macrocytic anemia   RBBB   Blindness   S/P TAVR (transcatheter aortic valve replacement)   Anemia in chronic kidney disease   Secondary hyperparathyroidism of renal origin ([place])   PAD (peripheral artery disease) ([place])   DNR (do not  resuscitate)   Pressure injury of skin   Acute cholecystitis: He was recently admitted from 1/11-03/2020 for acute cholecystitis managed with percutaneous cholecystectomy tube which was unintentionally dislodged and subsequently removed on 1/19 and was discharged with oral antibiotics.  After being discharged to nursing facility, he developed severe abdominal pain with associated nausea and vomiting.  On presentation she was afebrile, lab work showed worsening leukocytosis.  Right upper quadrant ultrasound showed distended gallbladder with cholelithiasis, sludge, pericholecystic fluid. Murphy sign was positive. IR, general surgery consulted.  Underwent cholecystostomy tube placement by IR on 1/24.    Blood cultures no growth till date.  Aerobic/anaerobic culture have not shown any growth, no organisms.Patient was having consistent nausea/vomiting after the procedure.   X-ray of the abdomen done here did not show any bowel obstruction.  CT abdomen/pelvis done on 1/27 showed dilated gallbladder.General surgery continued to recommend against surgical intervention, continue right upper quadrant drain. CT abdomen/pelvis with contrast did not show any acute intra-abdominal pelvic findings He is having bowel movements. Nausea, vomiting, abdominal pain better since yesterday.  Leukocytosis improving.  We will advance his diet to soft, antibiotics changed to oral to complete 14 days course.  He needs to follow-up with IR and general surgery as an outpatient  Bilateral pleural effusion/Right-sided pneumonia: Chest x-ray showed  to large RIGHT and small LEFT pleural effusions with associated RIGHT basilar atelectasis .  Currently on room air.  Underwent right sided  ultrasound-guided thoracentesis with removal of 1.1 L of dark red fluid.no organisms seen on the pleural fluid.,  Cultures, cytology pending .  CT imaging showed pneumonia on the right side.  He was having productive cough.  He was  on vancomycin  ,cefepime.  Leukocytosis has  improved.  Antibiotics changed to oral.  ESRD: On dialysis on MWF.  Nephrology following for dialysis  Chest pain :complaint of chest pain earlier but none today.   Troponin mildly elevated at 28 likely secondary to demand ischemia from ESRD  Paroxysmal A. fib: Currently in normal sinus rhythm.  Not on anticoagulation due to concern for GI bleed.  On low-dose metoprolol for rate control.  Monitor on telemetry.  Blood pressure is soft.  Might need to discontinue metoprolol if his blood pressure remains low  Non-insulin-dependent diabetes type 2: Recent A1c of 7%.  Monitor blood sugars.  Currently on sliding scale insulin.  On gabapentin for diabetic neuropathy.  History of glaucoma: Continue eyedrops  Parkinson's disease: Continue Sinemet.  Delirium precautions  History of peripheral vascular disease : status post left BKA.  Takes Plavix , continued  History of urinary retention: On last admission Foley was placed and he was discharged with Foley.  Continue Foley catheter for now.  We recommend to follow-up with urology as an outpatient  Macrocytic anemia/esophageal candidiasis: Associated with ESRD.  No evidence of bleeding.  Hemoglobin stable.  He underwent EGD on last admission.  Biopsies showed candidiasis.  He has been started on 7 days course of fluconazole.    Hyponatremia: Likely from volume overload.  Continue monitoring.  Volume management as per dialysis.  Potassium supplemented for mild hypokalemia today  Hiccups: Continue Thorazine as needed  Debility/deconditioning: He came from SNF.  We are planning to send him to SNF in next 1 to 2 days.  He has a bed  Pressure Injury 03/28/21 Coccyx Medial Stage 2 -  Partial thickness loss of dermis presenting as a shallow open injury with a red, pink wound bed without slough. wound bed is pink, no drainage noted (Active)  03/28/21 1028  Location: Coccyx  Location Orientation: Medial  Staging: Stage 2 -   Partial thickness loss of dermis presenting as a shallow open injury with a red, pink wound bed without slough.  Wound Description (Comments): wound bed is pink, no drainage noted  Present on Admission: Yes                DVT prophylaxis:Heparin [place] Code Status: DNR Family Communication:: Discussed with  daughter on phone on 1/31 Patient status: Inpatient  Dispo: The patient is from: Skilled nursing facility              Anticipated d/c is to: Skilled nursing facility              Anticipated d/c date is: In next 24-48 hrs. his nausea, vomiting getting better.He was still on full liquid diet today, will advance.  Consultants: IR, general surgery, nephrology  Procedures: None  Antimicrobials:  Anti-infectives (From admission, onward)    Start     Dose/Rate Route Frequency Ordered Stop   04/04/21 1215  amoxicillin-clavulanate (AUGMENTIN) 875-125 MG per tablet 1 tablet        1 tablet Oral Every 12 hours 04/04/21 1125 04/10/21 0959   04/03/21 1800  ceFEPIme (MAXIPIME) 2 g in sodium chloride 0.9 % 100 mL IVPB  Status:  Discontinued        2 g 200 mL/hr over 30 Minutes Intravenous Every M-W-F (1800) 04/03/21 0942 04/04/21 1125   04/03/21 1200  vancomycin (VANCOREADY) IVPB 750 mg/150 mL  Status:  Discontinued        750 mg 150 mL/hr over 60 Minutes Intravenous Every M-W-F (Hemodialysis) 04/02/21 0934 04/04/21 0932   04/02/21 1800  fluconazole (DIFLUCAN) IVPB 100 mg        100 mg 50 mL/hr over 60 Minutes Intravenous Every 24 hours 04/01/21 1504     04/02/21 1700  ceFEPIme (MAXIPIME) 1 g in sodium chloride 0.9 % 100 mL IVPB  Status:  Discontinued        1 g 200 mL/hr over 30 Minutes Intravenous Every 24 hours 04/02/21 1509 04/03/21 0942   04/02/21 0945  vancomycin (VANCOREADY) IVPB 750 mg/150 mL        750 mg 150 mL/hr over 60 Minutes Intravenous  Once 04/02/21 0853 04/02/21 1158   03/31/21 1200  fluconazole (DIFLUCAN) IVPB 100 mg  Status:  Discontinued        100 mg 50 mL/hr  over 60 Minutes Intravenous Every 24 hours 03/31/21 1112 04/01/21 1504   03/30/21 1245  fluconazole (DIFLUCAN) tablet 100 mg  Status:  Discontinued        100 mg Oral Daily 03/30/21 1157 03/31/21 1112   03/28/21 1645  cefOXitin (MEFOXIN) 2 g in sodium chloride 0.9 % 100 mL IVPB       Note to Pharmacy: Please send to tube station 17 radiology- for procedure   2 g 200 mL/hr over 30 Minutes Intravenous  Once 03/28/21 1550 03/28/21 1720   03/27/21 1645  vancomycin (VANCOREADY) IVPB 1500 mg/300 mL        1,500 mg 150 mL/hr over 120 Minutes Intravenous  Once 03/27/21 1632 03/27/21 1942   03/27/21 1630  piperacillin-tazobactam (ZOSYN) IVPB 2.25 g  Status:  Discontinued        2.25 g 100 mL/hr over 30 Minutes Intravenous Every 8 hours 03/27/21 1617 04/02/21 0829       Subjective: Patient seen and examined at the bedside this morning.  Hemodynamically stable.  His nausea, vomiting and abdominal pain are better today.  Still having hiccups with some phlegm.  Abdomen was benign on examination.  He feels that he needs to be here at least 1 or 2 more days.  Objective: Vitals:   04/03/21 2051 04/04/21 0644 04/04/21 0731 04/04/21 0927  BP: (!) 99/46  (!) 104/53   Pulse: 68  66   Resp: 18  18   Temp: 98 F (36.7 C)  98 F (36.7 C)   TempSrc: Oral     SpO2:   100% 93%  Weight:  67.9 kg    Height:        Intake/Output Summary (Last 24 hours) at 04/04/2021 1130 Last data filed at 04/04/2021 7517 Gross per 24 hour  Intake 360 ml  Output 1311 ml  Net -951 ml    Filed Weights   04/03/21 0948 04/03/21 1400 04/04/21 0644  Weight: 68.4 kg 67.3 kg 67.9 kg    Examination:  General exam: Very deconditioned, chronically ill looking, weak HEENT: Legally blind Respiratory system: Mild diminished air entry on the right side, no wheezing or crackles Cardiovascular system: S1 & S2 heard, RRR.  Gastrointestinal system: Abdomen is nondistended, soft and nontender.  Right upper quadrant drain with  sanguinous output Central nervous system: Alert and oriented Extremities: Left BKA, AV fistula on the left upper extremity Skin: No rash or icterus.  Pressure ulcer as above  Data Reviewed: I have personally reviewed following labs and imaging studies  CBC: Recent Labs  Lab 03/31/21 0836 04/01/21 0154 04/02/21 0041 04/03/21 0249 04/04/21 0334  WBC 13.7* 19.7* 23.7* 18.7* 15.5*  NEUTROABS 11.0* 16.9* 20.7* 15.9* 13.5*  HGB 9.5* 9.2* 8.3* 8.4* 8.4*  HCT 28.6* 26.4* 24.8* 25.3* 25.8*  MCV 101.1* 100.0 102.1* 102.8* 102.8*  PLT 506* 501* 466* 442* 762*   Basic Metabolic Panel: Recent Labs  Lab 04/01/21 0154 04/01/21 1148 04/02/21 0041 04/03/21 0249 04/04/21 0334  NA 134* 134* 134* 135 137  K 3.7 3.9 3.6 3.4* 3.9  CL 95* 97* 97* 97* 101  CO2 23 23 22  20* 23  GLUCOSE 104* 108* 106* 87 115*  BUN 21 23 25* 33* 21  CREATININE 4.85* 5.38* 5.96* 7.25* 5.24*  CALCIUM 8.8* 9.0 8.8* 8.9 8.6*  PHOS  --  4.9*  --   --   --    GFR: Estimated Creatinine Clearance: 9.5 mL/min (A) (by C-G formula based on SCr of 5.24 mg/dL (H)). Liver Function Tests: Recent Labs  Lab 03/31/21 1102 04/01/21 1148 04/02/21 0041  AST 20  --  16  ALT 22  --  6  ALKPHOS 133*  --  212*  BILITOT 0.4  --  0.9  PROT 7.2  --  5.6*  ALBUMIN 3.4* 2.0* 1.8*   Recent Labs  Lab 03/31/21 1102  LIPASE 29   No results for input(s): AMMONIA in the last 168 hours. Coagulation Profile: No results for input(s): INR, PROTIME in the last 168 hours.  Cardiac Enzymes: No results for input(s): CKTOTAL, CKMB, CKMBINDEX, TROPONINI in the last 168 hours. BNP (last 3 results) No results for input(s): PROBNP in the last 8760 hours. HbA1C: No results for input(s): HGBA1C in the last 72 hours. CBG: Recent Labs  Lab 04/01/21 0821 04/01/21 1200 04/01/21 1552 04/01/21 2048 04/03/21 1707  GLUCAP 96 101* 104* 109* 96   Lipid Profile: No results for input(s): CHOL, HDL, LDLCALC, TRIG, CHOLHDL, LDLDIRECT in the  last 72 hours. Thyroid Function Tests: No results for input(s): TSH, T4TOTAL, FREET4, T3FREE, THYROIDAB in the last 72 hours. Anemia Panel: No results for input(s): VITAMINB12, FOLATE, FERRITIN, TIBC, IRON, RETICCTPCT in the last 72 hours. Sepsis Labs: No results for input(s): PROCALCITON, LATICACIDVEN in the last 168 hours.  Recent Results (from the past 240 hour(s))  Resp Panel by RT-PCR (Flu A&B, Covid) Nasopharyngeal Swab     Status: None   Collection Time: 03/26/21 11:56 AM   Specimen: Nasopharyngeal Swab; Nasopharyngeal(NP) swabs in vial transport medium  Result Value Ref Range Status   SARS Coronavirus 2 by RT PCR NEGATIVE NEGATIVE Final    Comment: (NOTE) SARS-CoV-2 target nucleic acids are NOT DETECTED.  The SARS-CoV-2 RNA is generally detectable in upper respiratory specimens during the acute phase of infection. The lowest concentration of SARS-CoV-2 viral copies this assay can detect is 138 copies/mL. A negative result does not preclude SARS-Cov-2 infection and should not be used as the sole basis for treatment or other patient management decisions. A negative result may occur with  improper specimen collection/handling, submission of specimen other than nasopharyngeal swab, presence of viral mutation(s) within the areas targeted by this assay, and inadequate number of viral copies(<138 copies/mL). A negative result must be combined with clinical observations, patient history, and epidemiological information. The expected result is Negative.  Fact Sheet for Patients:  EntrepreneurPulse.com.au  Fact Sheet for Healthcare Providers:  IncredibleEmployment.be  This test is no t yet approved or cleared by the [place] FDA and  has been authorized for detection and/or diagnosis of SARS-CoV-2 by FDA under an Emergency Use Authorization (EUA). This EUA will remain  in effect (meaning this test can be used) for the duration of  the COVID-19 declaration under Section  564(b)(1) of the Act, 21 U.S.C.section 360bbb-3(b)(1), unless the authorization is terminated  or revoked sooner.       Influenza A by PCR NEGATIVE NEGATIVE Final   Influenza B by PCR NEGATIVE NEGATIVE Final    Comment: (NOTE) The Xpert Xpress SARS-CoV-2/FLU/RSV plus assay is intended as an aid in the diagnosis of influenza from Nasopharyngeal swab specimens and should not be used as a sole basis for treatment. Nasal washings and aspirates are unacceptable for Xpert Xpress SARS-CoV-2/FLU/RSV testing.  Fact Sheet for Patients: EntrepreneurPulse.com.au  Fact Sheet for Healthcare Providers: IncredibleEmployment.be  This test is not yet approved or cleared by the [place] FDA and has been authorized for detection and/or diagnosis of SARS-CoV-2 by FDA under an Emergency Use Authorization (EUA). This EUA will remain in effect (meaning this test can be used) for the duration of the COVID-19 declaration under Section 564(b)(1) of the Act, 21 U.S.C. section 360bbb-3(b)(1), unless the authorization is terminated or revoked.  Performed at [facility], [place] [address] [place], [place] [address]   Resp Panel by RT-PCR (Flu A&B, Covid) Nasopharyngeal Swab     Status: None   Collection Time: 03/27/21  2:10 PM   Specimen: Nasopharyngeal Swab; Nasopharyngeal(NP) swabs in vial transport medium  Result Value Ref Range Status   SARS Coronavirus 2 by RT PCR NEGATIVE NEGATIVE Final    Comment: (NOTE) SARS-CoV-2 target nucleic acids are NOT DETECTED.  The SARS-CoV-2 RNA is generally detectable in upper respiratory specimens during the acute phase of infection. The lowest concentration of SARS-CoV-2 viral copies this assay can detect is 138 copies/mL. A negative result does not preclude SARS-Cov-2 infection and should not be used as the sole basis for treatment or other patient management decisions. A negative  result may occur with  improper specimen collection/handling, submission of specimen other than nasopharyngeal swab, presence of viral mutation(s) within the areas targeted by this assay, and inadequate number of viral copies(<138 copies/mL). A negative result must be combined with clinical observations, patient history, and epidemiological information. The expected result is Negative.  Fact Sheet for Patients:  EntrepreneurPulse.com.au  Fact Sheet for Healthcare Providers:  IncredibleEmployment.be  This test is no t yet approved or cleared by the [place] FDA and  has been authorized for detection and/or diagnosis of SARS-CoV-2 by FDA under an Emergency Use Authorization (EUA). This EUA will remain  in effect (meaning this test can be used) for the duration of the COVID-19 declaration under Section 564(b)(1) of the Act, 21 U.S.C.section 360bbb-3(b)(1), unless the authorization is terminated  or revoked sooner.       Influenza A by PCR NEGATIVE NEGATIVE Final   Influenza B by PCR NEGATIVE NEGATIVE Final    Comment: (NOTE) The Xpert Xpress SARS-CoV-2/FLU/RSV plus assay is intended as an aid in the diagnosis of influenza from Nasopharyngeal swab specimens and should not be used as a sole basis for treatment. Nasal washings and aspirates are unacceptable for Xpert Xpress SARS-CoV-2/FLU/RSV testing.  Fact Sheet for Patients: EntrepreneurPulse.com.au  Fact Sheet for Healthcare Providers: IncredibleEmployment.be  This test is not yet approved or cleared by the [place] FDA and has been authorized for detection and/or diagnosis of SARS-CoV-2 by FDA under an Emergency Use Authorization (EUA). This EUA will remain in effect (meaning this test can be used) for the duration of the COVID-19 declaration under Section 564(b)(1) of the Act, 21 U.S.C. section 360bbb-3(b)(1), unless the authorization is  terminated or revoked.  Performed at [facility],  [address] [place], [place] [address]   Culture, blood (routine x 2)     Status: None   Collection Time: 03/27/21  4:03 PM   Specimen: BLOOD  Result Value Ref Range Status   Specimen Description BLOOD BLOOD RIGHT ARM  Final   Special Requests   Final    BOTTLES DRAWN AEROBIC AND ANAEROBIC Blood Culture results may not be optimal due to an excessive volume of blood received in culture bottles   Culture   Final    NO GROWTH 5 DAYS Performed at [facility], [place] [address] [place], [place] [address]    Report Status 04/01/2021 FINAL  Final  Culture, blood (routine x 2)     Status: None   Collection Time: 03/27/21  6:49 PM   Specimen: BLOOD RIGHT HAND  Result Value Ref Range Status   Specimen Description BLOOD RIGHT HAND  Final   Special Requests   Final    BOTTLES DRAWN AEROBIC AND ANAEROBIC Blood Culture adequate volume   Culture   Final    NO GROWTH 5 DAYS Performed at [facility], [place] [address], [place], [place] [address]    Report Status 04/01/2021 FINAL  Final  Aerobic/Anaerobic Culture w Gram Stain (surgical/deep wound)     Status: None   Collection Time: 03/28/21  5:10 PM   Specimen: Abscess  Result Value Ref Range Status   Specimen Description ABSCESS  Final   Special Requests GALLBLADDER  Final   Gram Stain   Final    FEW WBC PRESENT,BOTH PMN AND MONONUCLEAR NO ORGANISMS SEEN    Culture   Final    No growth aerobically or anaerobically. Performed at [facility], [place] [address], [place], [place] [address]    Report Status 04/02/2021 FINAL  Final  Body fluid culture w Gram Stain     Status: None   Collection Time: 04/01/21  1:37 PM   Specimen: Lung, Right Lower Lobe; Pleural Fluid  Result Value Ref Range Status   Specimen Description PLEURAL FLUID  Final   Special Requests LUNG RIGHT LOWER LOBE  Final   Gram Stain   Final    RARE WBC PRESENT,BOTH PMN AND MONONUCLEAR NO  ORGANISMS SEEN    Culture   Final    NO GROWTH Performed at [facility], [place] [address], [place], [place] [address]    Report Status 04/04/2021 FINAL  Final  Culture, blood (routine x 2)     Status: None (Preliminary result)   Collection Time: 04/02/21  9:57 AM   Specimen: BLOOD RIGHT WRIST  Result Value Ref Range Status   Specimen Description BLOOD RIGHT WRIST  Final   Special Requests   Final    BOTTLES DRAWN AEROBIC AND ANAEROBIC Blood Culture results may not be optimal due to an inadequate volume of blood received in culture bottles   Culture   Final    NO GROWTH 1 DAY Performed at [facility], [place] [address], [place], [place] [address]    Report Status PENDING  Incomplete  Culture, blood (routine x 2)     Status: None (Preliminary result)   Collection Time: 04/02/21  9:57 AM   Specimen: BLOOD RIGHT HAND  Result Value Ref Range Status   Specimen Description BLOOD RIGHT HAND  Final   Special Requests   Final    BOTTLES DRAWN AEROBIC AND ANAEROBIC Blood Culture results may not be optimal due to an inadequate  volume of blood received in culture bottles   Culture   Final    NO GROWTH 1 DAY Performed at [facility], [place] [address] [place], [place] [address]    Report Status PENDING  Incomplete  MRSA Next Gen by PCR, Nasal     Status: None   Collection Time: 04/03/21  9:50 AM   Specimen: Nasal Mucosa; Nasal Swab  Result Value Ref Range Status   MRSA by PCR Next Gen NOT DETECTED NOT DETECTED Final    Comment: (NOTE) The GeneXpert MRSA Assay (FDA approved for NASAL specimens only), is one component of a comprehensive MRSA colonization surveillance program. It is not intended to diagnose MRSA infection nor to guide or monitor treatment for MRSA infections. Test performance is not FDA approved in patients less than 26 years old. Performed at [facility], [place] [address] [place], [place] [address]          Radiology Studies: CT CHEST W  CONTRAST  Result Date: 04/02/2021 CLINICAL DATA:  Acute abdominal pain.  Pneumonia. EXAM: CT CHEST, ABDOMEN, AND PELVIS WITH CONTRAST TECHNIQUE: Multidetector CT imaging of the chest, abdomen and pelvis was performed following the standard protocol during bolus administration of intravenous contrast. RADIATION DOSE REDUCTION: This exam was performed according to the departmental dose-optimization program which includes automated exposure control, adjustment of the mA and/or kV according to patient size and/or use of iterative reconstruction technique. CONTRAST:  165mL OMNIPAQUE IOHEXOL 350 MG/ML SOLN COMPARISON:  CT abdomen and pelvis 03/31/2021. CT angiogram chest abdomen and pelvis 12/01/2019. FINDINGS: CT CHEST FINDINGS Cardiovascular: The heart is mildly enlarged. There is no pericardial effusion. Aorta is normal in size. Patient is status post TAVR. There are atherosclerotic calcifications of the aorta and coronary arteries. Mediastinum/Nodes: No enlarged mediastinal, hilar, or axillary lymph nodes. Thyroid gland, trachea, and esophagus demonstrate no significant findings. Lungs/Pleura: There are small bilateral pleural effusions, right greater than left. Right pleural effusion has decreased from the prior examination. Right pleural effusion is likely partially loculated posteriorly. There is also small amount of loculated fluid in the right major fissure. There are patchy ground-glass and airspace opacities in the right upper lobe and right lower lobe. There is atelectasis in the left lower lobe. There is no evidence for pneumothorax. There is some secretions in the trachea. Trachea and central airways appear patent. There is a stable 3 mm nodule in the left upper lobe image 4/52. No new pulmonary nodules are identified. Musculoskeletal: No acute fractures. Degenerative changes affect the spine. CT ABDOMEN PELVIS FINDINGS Hepatobiliary: Percutaneous cholecystostomy tube is again seen. The gallbladder is  dilated similar to the prior study. There is a small amount of air in the gallbladder. Layering small stones are unchanged. Heterogeneous hyperdensity throughout the gallbladder likely represents hemorrhagic products, also unchanged. There is no biliary ductal dilatation. No focal liver lesions are seen. Pancreas: Unremarkable. No pancreatic ductal dilatation or surrounding inflammatory changes. Spleen: Normal in size without focal abnormality. Adrenals/Urinary Tract: 2.5 cm left adrenal nodule is indeterminate and unchanged. Right adrenal gland is within normal limits. The bladder is decompressed by Foley catheter. There is no hydronephrosis. There is a cortical hypodensity in the superior pole the left kidney which is too small to characterize, likely cysts. Stomach/Bowel: Appendix appears normal. No evidence of bowel wall thickening, distention, or inflammatory changes. There is scattered air-fluid levels throughout the colon. The stomach is completely decompressed and gastric wall thickening can not be excluded. Vascular/Lymphatic: Aortic atherosclerosis. No enlarged  abdominal or pelvic lymph nodes. Reproductive: Prostate radiotherapy seeds are present. Other: There is a small fat containing umbilical hernia. There is trace ascites, decreased from prior. There is mild body wall edema which is similar to the prior study. Musculoskeletal: No acute or significant osseous findings. IMPRESSION: 1. Small right pleural effusion has decreased in size. This effusion is partially loculated posteriorly and within the major fissure. Loculation may be secondary to chronicity, hemorrhage or malignancy. 2. Patchy ground-glass and airspace opacities in the right upper lobe and right lower lobe most consistent with pneumonia. 3. Stable small left pleural effusion. 4. The gallbladder is dilated. Cholecystostomy tube is in place. Gallbladder air, hemorrhagic products and gallstones are present. Appearance is unchanged from the  prior study. 5. Stable body wall edema and trace ascites. 6. Stable indeterminate left adrenal nodule. 7. Stable 3 mm left upper lobe pulmonary nodule. No follow-up needed if patient is low-risk. Non-contrast chest CT can be considered in 12 months if patient is high-risk. This recommendation follows the consensus statement: Guidelines for Management of Incidental Pulmonary Nodules Detected on CT Images: From the Fleischner Society 2017; Radiology 2017; 284:228-243. Electronically Signed   By: Ronney Asters M.D.   On: 04/02/2021 21:11   CT ABDOMEN PELVIS W CONTRAST  Result Date: 04/02/2021 CLINICAL DATA:  Acute abdominal pain.  Pneumonia. EXAM: CT CHEST, ABDOMEN, AND PELVIS WITH CONTRAST TECHNIQUE: Multidetector CT imaging of the chest, abdomen and pelvis was performed following the standard protocol during bolus administration of intravenous contrast. RADIATION DOSE REDUCTION: This exam was performed according to the departmental dose-optimization program which includes automated exposure control, adjustment of the mA and/or kV according to patient size and/or use of iterative reconstruction technique. CONTRAST:  140mL OMNIPAQUE IOHEXOL 350 MG/ML SOLN COMPARISON:  CT abdomen and pelvis 03/31/2021. CT angiogram chest abdomen and pelvis 12/01/2019. FINDINGS: CT CHEST FINDINGS Cardiovascular: The heart is mildly enlarged. There is no pericardial effusion. Aorta is normal in size. Patient is status post TAVR. There are atherosclerotic calcifications of the aorta and coronary arteries. Mediastinum/Nodes: No enlarged mediastinal, hilar, or axillary lymph nodes. Thyroid gland, trachea, and esophagus demonstrate no significant findings. Lungs/Pleura: There are small bilateral pleural effusions, right greater than left. Right pleural effusion has decreased from the prior examination. Right pleural effusion is likely partially loculated posteriorly. There is also small amount of loculated fluid in the right major  fissure. There are patchy ground-glass and airspace opacities in the right upper lobe and right lower lobe. There is atelectasis in the left lower lobe. There is no evidence for pneumothorax. There is some secretions in the trachea. Trachea and central airways appear patent. There is a stable 3 mm nodule in the left upper lobe image 4/52. No new pulmonary nodules are identified. Musculoskeletal: No acute fractures. Degenerative changes affect the spine. CT ABDOMEN PELVIS FINDINGS Hepatobiliary: Percutaneous cholecystostomy tube is again seen. The gallbladder is dilated similar to the prior study. There is a small amount of air in the gallbladder. Layering small stones are unchanged. Heterogeneous hyperdensity throughout the gallbladder likely represents hemorrhagic products, also unchanged. There is no biliary ductal dilatation. No focal liver lesions are seen. Pancreas: Unremarkable. No pancreatic ductal dilatation or surrounding inflammatory changes. Spleen: Normal in size without focal abnormality. Adrenals/Urinary Tract: 2.5 cm left adrenal nodule is indeterminate and unchanged. Right adrenal gland is within normal limits. The bladder is decompressed by Foley catheter. There is no hydronephrosis. There is a cortical hypodensity in the superior pole the left kidney which is  too small to characterize, likely cysts. Stomach/Bowel: Appendix appears normal. No evidence of bowel wall thickening, distention, or inflammatory changes. There is scattered air-fluid levels throughout the colon. The stomach is completely decompressed and gastric wall thickening can not be excluded. Vascular/Lymphatic: Aortic atherosclerosis. No enlarged abdominal or pelvic lymph nodes. Reproductive: Prostate radiotherapy seeds are present. Other: There is a small fat containing umbilical hernia. There is trace ascites, decreased from prior. There is mild body wall edema which is similar to the prior study. Musculoskeletal: No acute or  significant osseous findings. IMPRESSION: 1. Small right pleural effusion has decreased in size. This effusion is partially loculated posteriorly and within the major fissure. Loculation may be secondary to chronicity, hemorrhage or malignancy. 2. Patchy ground-glass and airspace opacities in the right upper lobe and right lower lobe most consistent with pneumonia. 3. Stable small left pleural effusion. 4. The gallbladder is dilated. Cholecystostomy tube is in place. Gallbladder air, hemorrhagic products and gallstones are present. Appearance is unchanged from the prior study. 5. Stable body wall edema and trace ascites. 6. Stable indeterminate left adrenal nodule. 7. Stable 3 mm left upper lobe pulmonary nodule. No follow-up needed if patient is low-risk. Non-contrast chest CT can be considered in 12 months if patient is high-risk. This recommendation follows the consensus statement: Guidelines for Management of Incidental Pulmonary Nodules Detected on CT Images: From the Fleischner Society 2017; Radiology 2017; 284:228-243. Electronically Signed   By: Ronney Asters M.D.   On: 04/02/2021 21:11        Scheduled Meds:  amoxicillin-clavulanate  1 tablet Oral Q12H   brimonidine  1 drop Right Eye TID   carbidopa-levodopa  1 tablet Oral TID   Chlorhexidine Gluconate Cloth  6 each Topical Q0600   cinacalcet  60 mg Oral Q M,W,F-HD   darbepoetin (ARANESP) injection - DIALYSIS  40 mcg Intravenous Q Wed-HD   dorzolamide-timolol  1 drop Right Eye BID   doxercalciferol  3 mcg Intravenous Q M,W,F-HD   gabapentin  100 mg Oral Daily   heparin injection (subcutaneous)  5,000 Units Subcutaneous Q8H   metoprolol tartrate  12.5 mg Oral BID   Netarsudil-Latanoprost  1 drop Right Eye QHS   pantoprazole  40 mg Oral Daily   pilocarpine  1 drop Right Eye QID   polyethylene glycol  17 g Oral Daily   senna  1 tablet Oral BID   sodium chloride flush  5 mL Intracatheter Q8H   Continuous Infusions:  sodium chloride 10  mL/hr at 04/02/21 0057   chlorproMAZINE (THORAZINE) IV Stopped (03/30/21 2350)   fluconazole (DIFLUCAN) IV 100 mg (04/03/21 1936)     LOS: 7 days       Shelly Coss, MD [facility] P1/31/2023, 11:30 AM

## 2021-04-04 NOTE — Progress Notes (Signed)
Subjective/Chief Complaint: Denies abdominal pain, nausea, or vomiting. No acute events overnight.  WBC 15 from 18   Objective: Vital signs in last 24 hours: Temp:  [97.6 F (36.4 C)-98.2 F (36.8 C)] 98 F (36.7 C) (01/31 0731) Pulse Rate:  [31-79] 66 (01/31 0731) Resp:  [10-20] 18 (01/31 0731) BP: (89-127)/(46-96) 104/53 (01/31 0731) SpO2:  [93 %-100 %] 93 % (01/31 0927) FiO2 (%):  [4 %] 4 % (01/31 0731) Weight:  [67.3 kg-67.9 kg] 67.9 kg (01/31 0644) Last BM Date: 04/04/21  Intake/Output from previous day: 01/30 0701 - 01/31 0700 In: 120 [P.O.:120] Out: 1301 [Stool:1] Intake/Output this shift: Total I/O In: 240 [P.O.:240] Out: 10 [Drains:10]  Elderly male in NAD Abd - soft, RUQ tenderness - mild around drain IR small amt dark bilious/sanguinous drainage  Lab Results:  Recent Labs    04/03/21 0249 04/04/21 0334  WBC 18.7* 15.5*  HGB 8.4* 8.4*  HCT 25.3* 25.8*  PLT 442* 489*   BMET Recent Labs    04/03/21 0249 04/04/21 0334  NA 135 137  K 3.4* 3.9  CL 97* 101  CO2 20* 23  GLUCOSE 87 115*  BUN 33* 21  CREATININE 7.25* 5.24*  CALCIUM 8.9 8.6*   PT/INR No results for input(s): LABPROT, INR in the last 72 hours. ABG No results for input(s): PHART, HCO3 in the last 72 hours.  Invalid input(s): PCO2, PO2  Studies/Results: CT CHEST W CONTRAST  Result Date: 04/02/2021 CLINICAL DATA:  Acute abdominal pain.  Pneumonia. EXAM: CT CHEST, ABDOMEN, AND PELVIS WITH CONTRAST TECHNIQUE: Multidetector CT imaging of the chest, abdomen and pelvis was performed following the standard protocol during bolus administration of intravenous contrast. RADIATION DOSE REDUCTION: This exam was performed according to the departmental dose-optimization program which includes automated exposure control, adjustment of the mA and/or kV according to patient size and/or use of iterative reconstruction technique. CONTRAST:  124mL OMNIPAQUE IOHEXOL 350 MG/ML SOLN COMPARISON:  CT  abdomen and pelvis 03/31/2021. CT angiogram chest abdomen and pelvis 12/01/2019. FINDINGS: CT CHEST FINDINGS Cardiovascular: The heart is mildly enlarged. There is no pericardial effusion. Aorta is normal in size. Patient is status post TAVR. There are atherosclerotic calcifications of the aorta and coronary arteries. Mediastinum/Nodes: No enlarged mediastinal, hilar, or axillary lymph nodes. Thyroid gland, trachea, and esophagus demonstrate no significant findings. Lungs/Pleura: There are small bilateral pleural effusions, right greater than left. Right pleural effusion has decreased from the prior examination. Right pleural effusion is likely partially loculated posteriorly. There is also small amount of loculated fluid in the right major fissure. There are patchy ground-glass and airspace opacities in the right upper lobe and right lower lobe. There is atelectasis in the left lower lobe. There is no evidence for pneumothorax. There is some secretions in the trachea. Trachea and central airways appear patent. There is a stable 3 mm nodule in the left upper lobe image 4/52. No new pulmonary nodules are identified. Musculoskeletal: No acute fractures. Degenerative changes affect the spine. CT ABDOMEN PELVIS FINDINGS Hepatobiliary: Percutaneous cholecystostomy tube is again seen. The gallbladder is dilated similar to the prior study. There is a small amount of air in the gallbladder. Layering small stones are unchanged. Heterogeneous hyperdensity throughout the gallbladder likely represents hemorrhagic products, also unchanged. There is no biliary ductal dilatation. No focal liver lesions are seen. Pancreas: Unremarkable. No pancreatic ductal dilatation or surrounding inflammatory changes. Spleen: Normal in size without focal abnormality. Adrenals/Urinary Tract: 2.5 cm left adrenal nodule is indeterminate and unchanged. Right  adrenal gland is within normal limits. The bladder is decompressed by Foley catheter. There  is no hydronephrosis. There is a cortical hypodensity in the superior pole the left kidney which is too small to characterize, likely cysts. Stomach/Bowel: Appendix appears normal. No evidence of bowel wall thickening, distention, or inflammatory changes. There is scattered air-fluid levels throughout the colon. The stomach is completely decompressed and gastric wall thickening can not be excluded. Vascular/Lymphatic: Aortic atherosclerosis. No enlarged abdominal or pelvic lymph nodes. Reproductive: Prostate radiotherapy seeds are present. Other: There is a small fat containing umbilical hernia. There is trace ascites, decreased from prior. There is mild body wall edema which is similar to the prior study. Musculoskeletal: No acute or significant osseous findings. IMPRESSION: 1. Small right pleural effusion has decreased in size. This effusion is partially loculated posteriorly and within the major fissure. Loculation may be secondary to chronicity, hemorrhage or malignancy. 2. Patchy ground-glass and airspace opacities in the right upper lobe and right lower lobe most consistent with pneumonia. 3. Stable small left pleural effusion. 4. The gallbladder is dilated. Cholecystostomy tube is in place. Gallbladder air, hemorrhagic products and gallstones are present. Appearance is unchanged from the prior study. 5. Stable body wall edema and trace ascites. 6. Stable indeterminate left adrenal nodule. 7. Stable 3 mm left upper lobe pulmonary nodule. No follow-up needed if patient is low-risk. Non-contrast chest CT can be considered in 12 months if patient is high-risk. This recommendation follows the consensus statement: Guidelines for Management of Incidental Pulmonary Nodules Detected on CT Images: From the Fleischner Society 2017; Radiology 2017; 284:228-243. Electronically Signed   By: Ronney Asters M.D.   On: 04/02/2021 21:11   CT ABDOMEN PELVIS W CONTRAST  Result Date: 04/02/2021 CLINICAL DATA:  Acute abdominal  pain.  Pneumonia. EXAM: CT CHEST, ABDOMEN, AND PELVIS WITH CONTRAST TECHNIQUE: Multidetector CT imaging of the chest, abdomen and pelvis was performed following the standard protocol during bolus administration of intravenous contrast. RADIATION DOSE REDUCTION: This exam was performed according to the departmental dose-optimization program which includes automated exposure control, adjustment of the mA and/or kV according to patient size and/or use of iterative reconstruction technique. CONTRAST:  171mL OMNIPAQUE IOHEXOL 350 MG/ML SOLN COMPARISON:  CT abdomen and pelvis 03/31/2021. CT angiogram chest abdomen and pelvis 12/01/2019. FINDINGS: CT CHEST FINDINGS Cardiovascular: The heart is mildly enlarged. There is no pericardial effusion. Aorta is normal in size. Patient is status post TAVR. There are atherosclerotic calcifications of the aorta and coronary arteries. Mediastinum/Nodes: No enlarged mediastinal, hilar, or axillary lymph nodes. Thyroid gland, trachea, and esophagus demonstrate no significant findings. Lungs/Pleura: There are small bilateral pleural effusions, right greater than left. Right pleural effusion has decreased from the prior examination. Right pleural effusion is likely partially loculated posteriorly. There is also small amount of loculated fluid in the right major fissure. There are patchy ground-glass and airspace opacities in the right upper lobe and right lower lobe. There is atelectasis in the left lower lobe. There is no evidence for pneumothorax. There is some secretions in the trachea. Trachea and central airways appear patent. There is a stable 3 mm nodule in the left upper lobe image 4/52. No new pulmonary nodules are identified. Musculoskeletal: No acute fractures. Degenerative changes affect the spine. CT ABDOMEN PELVIS FINDINGS Hepatobiliary: Percutaneous cholecystostomy tube is again seen. The gallbladder is dilated similar to the prior study. There is a small amount of air in  the gallbladder. Layering small stones are unchanged. Heterogeneous hyperdensity throughout the gallbladder  likely represents hemorrhagic products, also unchanged. There is no biliary ductal dilatation. No focal liver lesions are seen. Pancreas: Unremarkable. No pancreatic ductal dilatation or surrounding inflammatory changes. Spleen: Normal in size without focal abnormality. Adrenals/Urinary Tract: 2.5 cm left adrenal nodule is indeterminate and unchanged. Right adrenal gland is within normal limits. The bladder is decompressed by Foley catheter. There is no hydronephrosis. There is a cortical hypodensity in the superior pole the left kidney which is too small to characterize, likely cysts. Stomach/Bowel: Appendix appears normal. No evidence of bowel wall thickening, distention, or inflammatory changes. There is scattered air-fluid levels throughout the colon. The stomach is completely decompressed and gastric wall thickening can not be excluded. Vascular/Lymphatic: Aortic atherosclerosis. No enlarged abdominal or pelvic lymph nodes. Reproductive: Prostate radiotherapy seeds are present. Other: There is a small fat containing umbilical hernia. There is trace ascites, decreased from prior. There is mild body wall edema which is similar to the prior study. Musculoskeletal: No acute or significant osseous findings. IMPRESSION: 1. Small right pleural effusion has decreased in size. This effusion is partially loculated posteriorly and within the major fissure. Loculation may be secondary to chronicity, hemorrhage or malignancy. 2. Patchy ground-glass and airspace opacities in the right upper lobe and right lower lobe most consistent with pneumonia. 3. Stable small left pleural effusion. 4. The gallbladder is dilated. Cholecystostomy tube is in place. Gallbladder air, hemorrhagic products and gallstones are present. Appearance is unchanged from the prior study. 5. Stable body wall edema and trace ascites. 6. Stable  indeterminate left adrenal nodule. 7. Stable 3 mm left upper lobe pulmonary nodule. No follow-up needed if patient is low-risk. Non-contrast chest CT can be considered in 12 months if patient is high-risk. This recommendation follows the consensus statement: Guidelines for Management of Incidental Pulmonary Nodules Detected on CT Images: From the Fleischner Society 2017; Radiology 2017; 284:228-243. Electronically Signed   By: Ronney Asters M.D.   On: 04/02/2021 21:11    Anti-infectives: Anti-infectives (From admission, onward)    Start     Dose/Rate Route Frequency Ordered Stop   04/03/21 1800  ceFEPIme (MAXIPIME) 2 g in sodium chloride 0.9 % 100 mL IVPB        2 g 200 mL/hr over 30 Minutes Intravenous Every M-W-F (1800) 04/03/21 0942     04/03/21 1200  vancomycin (VANCOREADY) IVPB 750 mg/150 mL  Status:  Discontinued        750 mg 150 mL/hr over 60 Minutes Intravenous Every M-W-F (Hemodialysis) 04/02/21 0934 04/04/21 0932   04/02/21 1800  fluconazole (DIFLUCAN) IVPB 100 mg        100 mg 50 mL/hr over 60 Minutes Intravenous Every 24 hours 04/01/21 1504     04/02/21 1700  ceFEPIme (MAXIPIME) 1 g in sodium chloride 0.9 % 100 mL IVPB  Status:  Discontinued        1 g 200 mL/hr over 30 Minutes Intravenous Every 24 hours 04/02/21 1509 04/03/21 0942   04/02/21 0945  vancomycin (VANCOREADY) IVPB 750 mg/150 mL        750 mg 150 mL/hr over 60 Minutes Intravenous  Once 04/02/21 0853 04/02/21 1158   03/31/21 1200  fluconazole (DIFLUCAN) IVPB 100 mg  Status:  Discontinued        100 mg 50 mL/hr over 60 Minutes Intravenous Every 24 hours 03/31/21 1112 04/01/21 1504   03/30/21 1245  fluconazole (DIFLUCAN) tablet 100 mg  Status:  Discontinued        100 mg Oral Daily 03/30/21  1157 03/31/21 1112   03/28/21 1645  cefOXitin (MEFOXIN) 2 g in sodium chloride 0.9 % 100 mL IVPB       Note to Pharmacy: Please send to tube station 17 radiology- for procedure   2 g 200 mL/hr over 30 Minutes Intravenous  Once  03/28/21 1550 03/28/21 1720   03/27/21 1645  vancomycin (VANCOREADY) IVPB 1500 mg/300 mL        1,500 mg 150 mL/hr over 120 Minutes Intravenous  Once 03/27/21 1632 03/27/21 1942   03/27/21 1630  piperacillin-tazobactam (ZOSYN) IVPB 2.25 g  Status:  Discontinued        2.25 g 100 mL/hr over 30 Minutes Intravenous Every 8 hours 03/27/21 1617 04/02/21 0829       Assessment/Plan: Acute Cholecystitis - H/o of perc chole 1/12, removed 1/19 after becoming dislodged unintentionally - HIDA +. S/p Perc chole drain 1/24. Cx's pending with currently NGTD - Recommend continuing abx from our standpoint for at least 10d - WBC likely secondary to pneumonia - GB tube is in place     FEN: FLD, ADAT  ID: vanc 1/23. Zosyn 1/23 >  VTE: LMWH  Follow-Up: Dr. 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 Cx's pending.  Hx Aortic Stenosis s/p TAVR HTN HLD Hx Parkinson's   This care required moderate level of medical decision making.   LOS: 7 days    Elijah White 04/04/2021

## 2021-04-04 NOTE — Progress Notes (Signed)
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 The first IV team nurse was unable to get one, even with the ultrasound and second IV team nurse was only able to get one in pt's hand. She suggested pt may need a PICC for IV abx as his arm needs to heal for progression of veins to show bc the hand IV is only going to be useable for a short amount of time.

## 2021-04-04 NOTE — Progress Notes (Signed)
[facility] Progress Note   Subjective:   Reports he had a brief episode of dizziness when he rolled over in bed last night. Otherwise, no concerns. Denies SOB, CP, palpitations, abdominal pain and nausea.   Objective Vitals:   04/03/21 2051 04/04/21 0644 04/04/21 0731 04/04/21 0927  BP: (!) 99/46  (!) 104/53   Pulse: 68  66   Resp: 18  18   Temp: 98 F (36.7 C)  98 F (36.7 C)   TempSrc: Oral     SpO2:   100% 93%  Weight:  67.9 kg    Height:       Physical Exam General: Elderly male, alert and in NAD Heart: RRR, no murmurs, rubs or gallops Lungs: CTA bilaterally without wheezing, rhonchi or rales Abdomen: Soft, non-distended, +BS.  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 Dialysis Access:  LUE AVF + thrill  Additional Objective Labs: Basic Metabolic Panel: Recent Labs  Lab 04/01/21 1148 04/02/21 0041 04/03/21 0249 04/04/21 0334  NA 134* 134* 135 137  K 3.9 3.6 3.4* 3.9  CL 97* 97* 97* 101  CO2 23 22 20* 23  GLUCOSE 108* 106* 87 115*  BUN 23 25* 33* 21  CREATININE 5.38* 5.96* 7.25* 5.24*  CALCIUM 9.0 8.8* 8.9 8.6*  PHOS 4.9*  --   --   --    Liver Function Tests: Recent Labs  Lab 03/31/21 1102 04/01/21 1148 04/02/21 0041  AST 20  --  16  ALT 22  --  6  ALKPHOS 133*  --  212*  BILITOT 0.4  --  0.9  PROT 7.2  --  5.6*  ALBUMIN 3.4* 2.0* 1.8*   Recent Labs  Lab 03/31/21 1102  LIPASE 29   CBC: Recent Labs  Lab 03/31/21 0836 04/01/21 0154 04/02/21 0041 04/03/21 0249 04/04/21 0334  WBC 13.7* 19.7* 23.7* 18.7* 15.5*  NEUTROABS 11.0* 16.9* 20.7* 15.9* 13.5*  HGB 9.5* 9.2* 8.3* 8.4* 8.4*  HCT 28.6* 26.4* 24.8* 25.3* 25.8*  MCV 101.1* 100.0 102.1* 102.8* 102.8*  PLT 506* 501* 466* 442* 489*   Blood Culture    Component Value Date/Time   SDES BLOOD RIGHT WRIST 04/02/2021 0957   SDES BLOOD RIGHT HAND 04/02/2021 0957   SPECREQUEST  04/02/2021 0957    BOTTLES DRAWN AEROBIC AND ANAEROBIC Blood Culture results may not be  optimal due to an inadequate volume of blood received in culture bottles   SPECREQUEST  04/02/2021 0957    BOTTLES DRAWN AEROBIC AND ANAEROBIC Blood Culture results may not be optimal due to an inadequate volume of blood received in culture bottles   CULT  04/02/2021 0957    NO GROWTH 1 DAY Performed at [facility], [place] [address], [place], [place] [address]    CULT  04/02/2021 0957    NO GROWTH 1 DAY Performed at [facility], [place] [address], [place], [place] [address]    REPTSTATUS PENDING 04/02/2021 0957   REPTSTATUS PENDING 04/02/2021 0957    Cardiac Enzymes: No results for input(s): CKTOTAL, CKMB, CKMBINDEX, TROPONINI in the last 168 hours. CBG: Recent Labs  Lab 04/01/21 0821 04/01/21 1200 04/01/21 1552 04/01/21 2048 04/03/21 1707  GLUCAP 96 101* 104* 109* 96   Iron Studies: No results for input(s): IRON, TIBC, TRANSFERRIN, FERRITIN in the last 72 hours. @lablastinr3 @ Studies/Results: CT CHEST W CONTRAST  Result Date: 04/02/2021 CLINICAL DATA:  Acute abdominal pain.  Pneumonia. EXAM: CT CHEST, ABDOMEN, AND PELVIS WITH CONTRAST TECHNIQUE: Multidetector  CT imaging of the chest, abdomen and pelvis was performed following the standard protocol during bolus administration of intravenous contrast. RADIATION DOSE REDUCTION: This exam was performed according to the departmental dose-optimization program which includes automated exposure control, adjustment of the mA and/or kV according to patient size and/or use of iterative reconstruction technique. CONTRAST:  135mL OMNIPAQUE IOHEXOL 350 MG/ML SOLN COMPARISON:  CT abdomen and pelvis 03/31/2021. CT angiogram chest abdomen and pelvis 12/01/2019. FINDINGS: CT CHEST FINDINGS Cardiovascular: The heart is mildly enlarged. There is no pericardial effusion. Aorta is normal in size. Patient is status post TAVR. There are atherosclerotic calcifications of the aorta and coronary arteries. Mediastinum/Nodes: No enlarged mediastinal,  hilar, or axillary lymph nodes. Thyroid gland, trachea, and esophagus demonstrate no significant findings. Lungs/Pleura: There are small bilateral pleural effusions, right greater than left. Right pleural effusion has decreased from the prior examination. Right pleural effusion is likely partially loculated posteriorly. There is also small amount of loculated fluid in the right major fissure. There are patchy ground-glass and airspace opacities in the right upper lobe and right lower lobe. There is atelectasis in the left lower lobe. There is no evidence for pneumothorax. There is some secretions in the trachea. Trachea and central airways appear patent. There is a stable 3 mm nodule in the left upper lobe image 4/52. No new pulmonary nodules are identified. Musculoskeletal: No acute fractures. Degenerative changes affect the spine. CT ABDOMEN PELVIS FINDINGS Hepatobiliary: Percutaneous cholecystostomy tube is again seen. The gallbladder is dilated similar to the prior study. There is a small amount of air in the gallbladder. Layering small stones are unchanged. Heterogeneous hyperdensity throughout the gallbladder likely represents hemorrhagic products, also unchanged. There is no biliary ductal dilatation. No focal liver lesions are seen. Pancreas: Unremarkable. No pancreatic ductal dilatation or surrounding inflammatory changes. Spleen: Normal in size without focal abnormality. Adrenals/Urinary Tract: 2.5 cm left adrenal nodule is indeterminate and unchanged. Right adrenal gland is within normal limits. The bladder is decompressed by Foley catheter. There is no hydronephrosis. There is a cortical hypodensity in the superior pole the left kidney which is too small to characterize, likely cysts. Stomach/Bowel: Appendix appears normal. No evidence of bowel wall thickening, distention, or inflammatory changes. There is scattered air-fluid levels throughout the colon. The stomach is completely decompressed and gastric  wall thickening can not be excluded. Vascular/Lymphatic: Aortic atherosclerosis. No enlarged abdominal or pelvic lymph nodes. Reproductive: Prostate radiotherapy seeds are present. Other: There is a small fat containing umbilical hernia. There is trace ascites, decreased from prior. There is mild body wall edema which is similar to the prior study. Musculoskeletal: No acute or significant osseous findings. IMPRESSION: 1. Small right pleural effusion has decreased in size. This effusion is partially loculated posteriorly and within the major fissure. Loculation may be secondary to chronicity, hemorrhage or malignancy. 2. Patchy ground-glass and airspace opacities in the right upper lobe and right lower lobe most consistent with pneumonia. 3. Stable small left pleural effusion. 4. The gallbladder is dilated. Cholecystostomy tube is in place. Gallbladder air, hemorrhagic products and gallstones are present. Appearance is unchanged from the prior study. 5. Stable body wall edema and trace ascites. 6. Stable indeterminate left adrenal nodule. 7. Stable 3 mm left upper lobe pulmonary nodule. No follow-up needed if patient is low-risk. Non-contrast chest CT can be considered in 12 months if patient is high-risk. This recommendation follows the consensus statement: Guidelines for Management of Incidental Pulmonary Nodules Detected on CT Images: From the Fleischner Society 2017;  Radiology 2017; [id number]. Electronically Signed   By: Ronney Asters M.D.   On: 04/02/2021 21:11   CT ABDOMEN PELVIS W CONTRAST  Result Date: 04/02/2021 CLINICAL DATA:  Acute abdominal pain.  Pneumonia. EXAM: CT CHEST, ABDOMEN, AND PELVIS WITH CONTRAST TECHNIQUE: Multidetector CT imaging of the chest, abdomen and pelvis was performed following the standard protocol during bolus administration of intravenous contrast. RADIATION DOSE REDUCTION: This exam was performed according to the departmental dose-optimization program which includes  automated exposure control, adjustment of the mA and/or kV according to patient size and/or use of iterative reconstruction technique. CONTRAST:  145mL OMNIPAQUE IOHEXOL 350 MG/ML SOLN COMPARISON:  CT abdomen and pelvis 03/31/2021. CT angiogram chest abdomen and pelvis 12/01/2019. FINDINGS: CT CHEST FINDINGS Cardiovascular: The heart is mildly enlarged. There is no pericardial effusion. Aorta is normal in size. Patient is status post TAVR. There are atherosclerotic calcifications of the aorta and coronary arteries. Mediastinum/Nodes: No enlarged mediastinal, hilar, or axillary lymph nodes. Thyroid gland, trachea, and esophagus demonstrate no significant findings. Lungs/Pleura: There are small bilateral pleural effusions, right greater than left. Right pleural effusion has decreased from the prior examination. Right pleural effusion is likely partially loculated posteriorly. There is also small amount of loculated fluid in the right major fissure. There are patchy ground-glass and airspace opacities in the right upper lobe and right lower lobe. There is atelectasis in the left lower lobe. There is no evidence for pneumothorax. There is some secretions in the trachea. Trachea and central airways appear patent. There is a stable 3 mm nodule in the left upper lobe image 4/52. No new pulmonary nodules are identified. Musculoskeletal: No acute fractures. Degenerative changes affect the spine. CT ABDOMEN PELVIS FINDINGS Hepatobiliary: Percutaneous cholecystostomy tube is again seen. The gallbladder is dilated similar to the prior study. There is a small amount of air in the gallbladder. Layering small stones are unchanged. Heterogeneous hyperdensity throughout the gallbladder likely represents hemorrhagic products, also unchanged. There is no biliary ductal dilatation. No focal liver lesions are seen. Pancreas: Unremarkable. No pancreatic ductal dilatation or surrounding inflammatory changes. Spleen: Normal in size without  focal abnormality. Adrenals/Urinary Tract: 2.5 cm left adrenal nodule is indeterminate and unchanged. Right adrenal gland is within normal limits. The bladder is decompressed by Foley catheter. There is no hydronephrosis. There is a cortical hypodensity in the superior pole the left kidney which is too small to characterize, likely cysts. Stomach/Bowel: Appendix appears normal. No evidence of bowel wall thickening, distention, or inflammatory changes. There is scattered air-fluid levels throughout the colon. The stomach is completely decompressed and gastric wall thickening can not be excluded. Vascular/Lymphatic: Aortic atherosclerosis. No enlarged abdominal or pelvic lymph nodes. Reproductive: Prostate radiotherapy seeds are present. Other: There is a small fat containing umbilical hernia. There is trace ascites, decreased from prior. There is mild body wall edema which is similar to the prior study. Musculoskeletal: No acute or significant osseous findings. IMPRESSION: 1. Small right pleural effusion has decreased in size. This effusion is partially loculated posteriorly and within the major fissure. Loculation may be secondary to chronicity, hemorrhage or malignancy. 2. Patchy ground-glass and airspace opacities in the right upper lobe and right lower lobe most consistent with pneumonia. 3. Stable small left pleural effusion. 4. The gallbladder is dilated. Cholecystostomy tube is in place. Gallbladder air, hemorrhagic products and gallstones are present. Appearance is unchanged from the prior study. 5. Stable body wall edema and trace ascites. 6. Stable indeterminate left adrenal nodule. 7. Stable 3 mm left  upper lobe pulmonary nodule. No follow-up needed if patient is low-risk. Non-contrast chest CT can be considered in 12 months if patient is high-risk. This recommendation follows the consensus statement: Guidelines for Management of Incidental Pulmonary Nodules Detected on CT Images: From the Fleischner  Society 2017; Radiology 2017; 284:228-243. Electronically Signed   By: Ronney Asters M.D.   On: 04/02/2021 21:11   Medications:  sodium chloride 10 mL/hr at 04/02/21 0057   ceFEPime (MAXIPIME) IV 2 g (04/03/21 1810)   chlorproMAZINE (THORAZINE) IV Stopped (03/30/21 2350)   fluconazole (DIFLUCAN) IV 100 mg (04/03/21 1936)    brimonidine  1 drop Right Eye TID   carbidopa-levodopa  1 tablet Oral TID   Chlorhexidine Gluconate Cloth  6 each Topical Q0600   cinacalcet  60 mg Oral Q M,W,F-HD   darbepoetin (ARANESP) injection - DIALYSIS  40 mcg Intravenous Q Wed-HD   dorzolamide-timolol  1 drop Right Eye BID   doxercalciferol  3 mcg Intravenous Q M,W,F-HD   gabapentin  100 mg Oral Daily   heparin injection (subcutaneous)  5,000 Units Subcutaneous Q8H   metoprolol tartrate  12.5 mg Oral BID   Netarsudil-Latanoprost  1 drop Right Eye QHS   pantoprazole  40 mg Oral Daily   pilocarpine  1 drop Right Eye QID   polyethylene glycol  17 g Oral Daily   senna  1 tablet Oral BID   sodium chloride flush  5 mL Intracatheter Q8H    Dialysis Orders: [facility] MWF  4h  400/600  66kg 2/2 bath  P2  AVF  Hep none  - sensipar 60 mg po tiw  - hectorol 3 ug tiw IV  - mircera 75 ug q 2 wks, just ordered  Assessment/Plan: Abdominal pain: recent admit for acute cholecystitis, S/P perc chole tube 1/12- 1/17 per IR, dc'd on 1/22. Readmitted now w/ abd pain again and is SP new perc chole tube by IR 1/24. Repeating [facility]. Per primary/Gen Surgery.  Atrial fib - recent onset, per primary ESRD - on HD MWF. BP/volume  - No evidence of volume overload by exam. BP controlled, was slightly low after HD yesterday and had some dizziness despite small UF (1.3). UF goal 1.5L tomorrow as tolerated.  Anemia ckd  - Hb 8.4- declined over the weekend. started ESA last admit darbe 40 weekly, will continue.  Metabolic bone disease -  changed auryxia to Renvela binders 2 ac last admit, phos 4.9.  Corrected calcium is slightly high. Continue  sensipar, hectorol is on hold. Low calcium bath with dialysis.  Nutrition - Albumin <2. Protein supps ordered. Dys 3 diet  Parkinson's disease-per primary DMT2-per primary DNR  Anice Paganini, PA-C 04/04/2021, 10:29 AM  [facility] Pager: [phone]

## 2021-04-04 NOTE — Progress Notes (Signed)
MRSA PCR is neg. Ok to stop vanc per Dr Tawanna Solo.  Onnie Boer, PharmD, BCIDP, AAHIVP, CPP Infectious Disease Pharmacist 04/04/2021 9:32 AM

## 2021-04-04 NOTE — Progress Notes (Signed)
Supervising Physician: Dr. Annamaria Boots  Patient Status:  [facility] - In-pt  Chief Complaint:  Image guided drain placement, perc chole, 41F pigtail drain 03/28/21 w/ Dr. Earleen Newport.  Subjective:  Pt lying in bed. Feels pretty good, family at bedside  Allergies: Patient has no known allergies.  Medications:  Current Facility-Administered Medications:    0.9 %  sodium chloride infusion, , Intravenous, Continuous, Adhikari, Amrit, MD, Last Rate: 10 mL/hr at 04/02/21 0057, New Bag at 04/02/21 0057   acetaminophen (TYLENOL) tablet 650 mg, 650 mg, Oral, Q6H PRN, Patrecia Pour, MD, 650 mg at 03/30/21 0811   brimonidine (ALPHAGAN) 0.15 % ophthalmic solution 1 drop, 1 drop, Right Eye, TID, Patrecia Pour, MD, 1 drop at 04/04/21 0920   carbidopa-levodopa (SINEMET IR) 25-100 MG per tablet immediate release 1 tablet, 1 tablet, Oral, TID, [place], [place], [place], 1 tablet at 04/04/21 0920   ceFEPIme (MAXIPIME) 2 g in sodium chloride 0.9 % 100 mL IVPB, 2 g, Intravenous, Q M,W,F-1800, Pham, Minh Q, RPH-CPP, Last Rate: 200 mL/hr at 04/03/21 1810, 2 g at 04/03/21 1810   Chlorhexidine Gluconate Cloth 2 % PADS 6 each, 6 each, Topical, Q0600, Collins, Samantha G, PA-C   chlorproMAZINE (THORAZINE) 12.5 mg in sodium chloride 0.9 % 25 mL IVPB, 12.5 mg, Intravenous, Q8H PRN, Patrecia Pour, MD, Stopped at 03/30/21 2350   cinacalcet (SENSIPAR) tablet 60 mg, 60 mg, Oral, Q M,W,F-HD, Roney Jaffe, MD, 60 mg at 04/03/21 0957   Darbepoetin Alfa (ARANESP) injection 40 mcg, 40 mcg, Intravenous, Q Wed-HD, Roney Jaffe, MD, 40 mcg at 03/29/21 0939   dorzolamide-timolol (COSOPT) 22.3-6.8 MG/ML ophthalmic solution 1 drop, 1 drop, Right Eye, BID, Vance Gather B, MD, 1 drop at 04/04/21 5631   doxercalciferol (HECTOROL) injection 3 mcg, 3 mcg, Intravenous, Q M,W,F-HD, Roney Jaffe, MD, 3 mcg at 04/03/21 0958   fluconazole (DIFLUCAN) IVPB 100 mg, 100 mg, Intravenous, Q24H, Paytes, Austin A, RPH, Last Rate: 50 mL/hr at 04/03/21  1936, 100 mg at 04/03/21 1936   gabapentin (NEURONTIN) capsule 100 mg, 100 mg, Oral, Daily, Vance Gather B, MD, 100 mg at 04/03/21 2110   heparin injection 5,000 Units, 5,000 Units, Subcutaneous, Q8H, Adhikari, Amrit, MD, 5,000 Units at 04/04/21 0644   metoprolol tartrate (LOPRESSOR) tablet 12.5 mg, 12.5 mg, Oral, BID, Vance Gather B, MD, 12.5 mg at 04/04/21 0920   Netarsudil-Latanoprost 0.02-0.005 % SOLN 1 drop, 1 drop, Right Eye, QHS, Vance Gather B, MD   nitroGLYCERIN (NITROSTAT) SL tablet 0.4 mg, 0.4 mg, Sublingual, Q5 min PRN, Adhikari, Amrit, MD, 0.4 mg at 04/02/21 0025   ondansetron (ZOFRAN) injection 4 mg, 4 mg, Intravenous, Q6H PRN, Vance Gather B, MD, 4 mg at 04/02/21 0051   oxyCODONE (Oxy IR/ROXICODONE) immediate release tablet 5 mg, 5 mg, Oral, Q6H PRN, Tawanna Solo, Amrit, MD, 5 mg at 04/04/21 0935   pantoprazole (PROTONIX) EC tablet 40 mg, 40 mg, Oral, Daily, Adhikari, Amrit, MD, 40 mg at 04/04/21 0920   pilocarpine (PILOCAR) 4 % ophthalmic solution 1 drop, 1 drop, Right Eye, QID, Vance Gather B, MD, 1 drop at 04/04/21 0920   polyethylene glycol (MIRALAX / GLYCOLAX) packet 17 g, 17 g, Oral, Daily, Adhikari, Amrit, MD, 17 g at 04/04/21 0920   prochlorperazine (COMPAZINE) injection 10 mg, 10 mg, Intravenous, Q6H PRN, Tawanna Solo, Amrit, MD, 10 mg at 04/01/21 0137   senna (SENOKOT) tablet 8.6 mg, 1 tablet, Oral, BID, Adhikari, Amrit, MD, 8.6 mg at 04/01/21 0818   sodium chloride  flush (NS) 0.9 % injection 5 mL, 5 mL, Intracatheter, Q8H, Wagner, Jaime, DO, 5 mL at 04/04/21 0644    Vital Signs: BP (!) 104/53 (BP Location: Right Arm)    Pulse 66    Temp 98 F (36.7 C)    Resp 18    Ht 5\' 5"  (1.651 m)    Wt 67.9 kg    SpO2 93%    BMI 24.91 kg/m   Physical Exam Vitals reviewed.  Constitutional:      Appearance: He is not ill-appearing.  Cardiovascular:     Rate and Rhythm: Normal rate.  Pulmonary:     Effort: Pulmonary effort is normal. No respiratory distress.  Abdominal:     Comments:  Drain to RUQ unremarkable with sutures in place. No redness, bleeding or drainage noted. Dressing C/D/I.  Dark blood tinged bile in tubing/bag  Skin:    General: Skin is warm and dry.  Neurological:     Mental Status: He is alert.  Psychiatric:        Mood and Affect: Mood normal.        Behavior: Behavior normal.    Imaging: CT ABDOMEN PELVIS WO CONTRAST  Result Date: 03/31/2021 CLINICAL DATA:  Nausea and vomiting.  On dialysis. EXAM: CT ABDOMEN AND PELVIS WITHOUT CONTRAST TECHNIQUE: Multidetector CT imaging of the abdomen and pelvis was performed following the standard protocol without IV contrast. RADIATION DOSE REDUCTION: This exam was performed according to the departmental dose-optimization program which includes automated exposure control, adjustment of the mA and/or kV according to patient size and/or use of iterative reconstruction technique. COMPARISON:  Abdominal x-ray 03/31/2021. CT abdomen and pelvis 03/23/2021. FINDINGS: Lower chest: There is a moderate-sized right pleural effusion which has mildly increased. There is compressive atelectasis of the right lower lobe. There is a small left pleural effusion which appears stable. TAVR present. Hepatobiliary: The gallbladder is dilated and thick-walled. Heterogeneous hyperdensity is again seen within the gallbladder likely related to hemorrhage. Small layering gallstones are again noted. There is a new percutaneous cholecystostomy tube in place. There is a small amount of air in the gallbladder likely related to tube placement. There is no definite biliary ductal dilatation. No focal liver lesions are seen. Pancreas: Unremarkable. No pancreatic ductal dilatation or surrounding inflammatory changes. Spleen: Normal in size without focal abnormality. Adrenals/Urinary Tract: 2.5 x 2.1 cm left adrenal nodule is indeterminate and unchanged. Right adrenal gland within normal limits. There is bilateral renal atrophy. No urinary tract calculi or  hydronephrosis. Bladder is decompressed by Foley catheter. Stomach/Bowel: Stomach is within normal limits. Appendix appears normal. No evidence of bowel wall thickening, distention, or inflammatory changes. Vascular/Lymphatic: Aortic atherosclerosis. No enlarged abdominal or pelvic lymph nodes. Reproductive: Prostate radiotherapy seeds are again noted. Other: There is a small amount of free fluid in the pelvis similar to prior study. There is a small fat containing left inguinal hernia. Mild body wall edema persists. There is also a fat containing umbilical hernia. Musculoskeletal: No acute or significant osseous findings. IMPRESSION: 1. New cholecystostomy tube in place. 2. The gallbladder is dilated. Cholelithiasis again seen. Heterogeneous hyperdensity in the gallbladder likely represents hemorrhage, unchanged. 3. Moderate right pleural effusion has increased. Small left pleural effusion is stable. 4. Small volume ascites and body wall edema are stable. 5.  Aortic Atherosclerosis (ICD10-I70.0). Electronically Signed   By: Ronney Asters M.D.   On: 03/31/2021 18:22   CT CHEST W CONTRAST  Result Date: 04/02/2021 CLINICAL DATA:  Acute abdominal pain.  Pneumonia. EXAM: CT CHEST, ABDOMEN, AND PELVIS WITH CONTRAST TECHNIQUE: Multidetector CT imaging of the chest, abdomen and pelvis was performed following the standard protocol during bolus administration of intravenous contrast. RADIATION DOSE REDUCTION: This exam was performed according to the departmental dose-optimization program which includes automated exposure control, adjustment of the mA and/or kV according to patient size and/or use of iterative reconstruction technique. CONTRAST:  166mL OMNIPAQUE IOHEXOL 350 MG/ML SOLN COMPARISON:  CT abdomen and pelvis 03/31/2021. CT angiogram chest abdomen and pelvis 12/01/2019. FINDINGS: CT CHEST FINDINGS Cardiovascular: The heart is mildly enlarged. There is no pericardial effusion. Aorta is normal in size. Patient is  status post TAVR. There are atherosclerotic calcifications of the aorta and coronary arteries. Mediastinum/Nodes: No enlarged mediastinal, hilar, or axillary lymph nodes. Thyroid gland, trachea, and esophagus demonstrate no significant findings. Lungs/Pleura: There are small bilateral pleural effusions, right greater than left. Right pleural effusion has decreased from the prior examination. Right pleural effusion is likely partially loculated posteriorly. There is also small amount of loculated fluid in the right major fissure. There are patchy ground-glass and airspace opacities in the right upper lobe and right lower lobe. There is atelectasis in the left lower lobe. There is no evidence for pneumothorax. There is some secretions in the trachea. Trachea and central airways appear patent. There is a stable 3 mm nodule in the left upper lobe image 4/52. No new pulmonary nodules are identified. Musculoskeletal: No acute fractures. Degenerative changes affect the spine. CT ABDOMEN PELVIS FINDINGS Hepatobiliary: Percutaneous cholecystostomy tube is again seen. The gallbladder is dilated similar to the prior study. There is a small amount of air in the gallbladder. Layering small stones are unchanged. Heterogeneous hyperdensity throughout the gallbladder likely represents hemorrhagic products, also unchanged. There is no biliary ductal dilatation. No focal liver lesions are seen. Pancreas: Unremarkable. No pancreatic ductal dilatation or surrounding inflammatory changes. Spleen: Normal in size without focal abnormality. Adrenals/Urinary Tract: 2.5 cm left adrenal nodule is indeterminate and unchanged. Right adrenal gland is within normal limits. The bladder is decompressed by Foley catheter. There is no hydronephrosis. There is a cortical hypodensity in the superior pole the left kidney which is too small to characterize, likely cysts. Stomach/Bowel: Appendix appears normal. No evidence of bowel wall thickening,  distention, or inflammatory changes. There is scattered air-fluid levels throughout the colon. The stomach is completely decompressed and gastric wall thickening can not be excluded. Vascular/Lymphatic: Aortic atherosclerosis. No enlarged abdominal or pelvic lymph nodes. Reproductive: Prostate radiotherapy seeds are present. Other: There is a small fat containing umbilical hernia. There is trace ascites, decreased from prior. There is mild body wall edema which is similar to the prior study. Musculoskeletal: No acute or significant osseous findings. IMPRESSION: 1. Small right pleural effusion has decreased in size. This effusion is partially loculated posteriorly and within the major fissure. Loculation may be secondary to chronicity, hemorrhage or malignancy. 2. Patchy ground-glass and airspace opacities in the right upper lobe and right lower lobe most consistent with pneumonia. 3. Stable small left pleural effusion. 4. The gallbladder is dilated. Cholecystostomy tube is in place. Gallbladder air, hemorrhagic products and gallstones are present. Appearance is unchanged from the prior study. 5. Stable body wall edema and trace ascites. 6. Stable indeterminate left adrenal nodule. 7. Stable 3 mm left upper lobe pulmonary nodule. No follow-up needed if patient is low-risk. Non-contrast chest CT can be considered in 12 months if patient is high-risk. This recommendation follows the consensus statement: Guidelines for Management of Incidental  Pulmonary Nodules Detected on CT Images: From the Fleischner Society 2017; Radiology 2017; [phone]. Electronically Signed   By: Ronney Asters M.D.   On: 04/02/2021 21:11   CT ABDOMEN PELVIS W CONTRAST  Result Date: 04/02/2021 CLINICAL DATA:  Acute abdominal pain.  Pneumonia. EXAM: CT CHEST, ABDOMEN, AND PELVIS WITH CONTRAST TECHNIQUE: Multidetector CT imaging of the chest, abdomen and pelvis was performed following the standard protocol during bolus administration of  intravenous contrast. RADIATION DOSE REDUCTION: This exam was performed according to the departmental dose-optimization program which includes automated exposure control, adjustment of the mA and/or kV according to patient size and/or use of iterative reconstruction technique. CONTRAST:  168mL OMNIPAQUE IOHEXOL 350 MG/ML SOLN COMPARISON:  CT abdomen and pelvis 03/31/2021. CT angiogram chest abdomen and pelvis 12/01/2019. FINDINGS: CT CHEST FINDINGS Cardiovascular: The heart is mildly enlarged. There is no pericardial effusion. Aorta is normal in size. Patient is status post TAVR. There are atherosclerotic calcifications of the aorta and coronary arteries. Mediastinum/Nodes: No enlarged mediastinal, hilar, or axillary lymph nodes. Thyroid gland, trachea, and esophagus demonstrate no significant findings. Lungs/Pleura: There are small bilateral pleural effusions, right greater than left. Right pleural effusion has decreased from the prior examination. Right pleural effusion is likely partially loculated posteriorly. There is also small amount of loculated fluid in the right major fissure. There are patchy ground-glass and airspace opacities in the right upper lobe and right lower lobe. There is atelectasis in the left lower lobe. There is no evidence for pneumothorax. There is some secretions in the trachea. Trachea and central airways appear patent. There is a stable 3 mm nodule in the left upper lobe image 4/52. No new pulmonary nodules are identified. Musculoskeletal: No acute fractures. Degenerative changes affect the spine. CT ABDOMEN PELVIS FINDINGS Hepatobiliary: Percutaneous cholecystostomy tube is again seen. The gallbladder is dilated similar to the prior study. There is a small amount of air in the gallbladder. Layering small stones are unchanged. Heterogeneous hyperdensity throughout the gallbladder likely represents hemorrhagic products, also unchanged. There is no biliary ductal dilatation. No focal  liver lesions are seen. Pancreas: Unremarkable. No pancreatic ductal dilatation or surrounding inflammatory changes. Spleen: Normal in size without focal abnormality. Adrenals/Urinary Tract: 2.5 cm left adrenal nodule is indeterminate and unchanged. Right adrenal gland is within normal limits. The bladder is decompressed by Foley catheter. There is no hydronephrosis. There is a cortical hypodensity in the superior pole the left kidney which is too small to characterize, likely cysts. Stomach/Bowel: Appendix appears normal. No evidence of bowel wall thickening, distention, or inflammatory changes. There is scattered air-fluid levels throughout the colon. The stomach is completely decompressed and gastric wall thickening can not be excluded. Vascular/Lymphatic: Aortic atherosclerosis. No enlarged abdominal or pelvic lymph nodes. Reproductive: Prostate radiotherapy seeds are present. Other: There is a small fat containing umbilical hernia. There is trace ascites, decreased from prior. There is mild body wall edema which is similar to the prior study. Musculoskeletal: No acute or significant osseous findings. IMPRESSION: 1. Small right pleural effusion has decreased in size. This effusion is partially loculated posteriorly and within the major fissure. Loculation may be secondary to chronicity, hemorrhage or malignancy. 2. Patchy ground-glass and airspace opacities in the right upper lobe and right lower lobe most consistent with pneumonia. 3. Stable small left pleural effusion. 4. The gallbladder is dilated. Cholecystostomy tube is in place. Gallbladder air, hemorrhagic products and gallstones are present. Appearance is unchanged from the prior study. 5. Stable body wall edema and trace ascites.  6. Stable indeterminate left adrenal nodule. 7. Stable 3 mm left upper lobe pulmonary nodule. No follow-up needed if patient is low-risk. Non-contrast chest CT can be considered in 12 months if patient is high-risk. This  recommendation follows the consensus statement: Guidelines for Management of Incidental Pulmonary Nodules Detected on CT Images: From the Fleischner Society 2017; Radiology 2017; [id number]. Electronically Signed   By: Ronney Asters M.D.   On: 04/02/2021 21:11   DG CHEST PORT 1 VIEW  Result Date: 04/02/2021 CLINICAL DATA:  Shortness of breath.  Follow-up exam. EXAM: PORTABLE CHEST 1 VIEW COMPARISON:  03/24/2021 and older studies. FINDINGS: Patchy airspace opacity has increased at the right lung base, extending to the right mid lung. Mild linear atelectasis or scarring is noted at the medial left lung base, stable. Remainder of the lungs is clear. Cardiac silhouette normal in size. Stable endovascular aortic valve replacement. No pneumothorax. Stable pigtail catheter in the right upper quadrant. IMPRESSION: 1. Interval increase in airspace opacity at the right lung base from the previous day's exam, which may reflect reaccumulation of fluid and associated atelectasis, with infection also in the differential diagnosis. 2. No other change.  No pneumothorax. Electronically Signed   By: Lajean Manes M.D.   On: 04/02/2021 08:30   DG Chest Port 1 View  Result Date: 04/01/2021 CLINICAL DATA:  Status post thoracentesis.  Right pleural effusion. EXAM: PORTABLE CHEST 1 VIEW COMPARISON:  03/27/2021. FINDINGS: There is decreased opacity at the right lung base with most of the right hemidiaphragm now defined. Residual opacity at the right base is consistent with atelectasis, likely with a small residual pleural effusion. Remainder of the lungs is clear. No pneumothorax. A pigtail catheter projects in the right upper quadrant. Cardiac silhouette is normal in size. IMPRESSION: 1. Decreased right lung base opacity following thoracentesis. Residual opacity consistent with atelectasis and a small effusion. 2. Remainder of the lungs is clear. 3. No pneumothorax. Electronically Signed   By: Lajean Manes M.D.   On: 04/01/2021  13:37   US THORACENTESIS ASP PLEURAL SPACE W/IMG GUIDE  Result Date: 04/01/2021 INDICATION: Patient with history of end-stage renal disease, AFib, acute cholecystitis status post percutaneous cholecystostomy placement now with worsening bilateral pleural effusions. Request to IR for diagnostic and therapeutic right thoracentesis EXAM: ULTRASOUND GUIDED RIGHT THORACENTESIS MEDICATIONS: 7 mL 1% lidocaine COMPLICATIONS: None immediate. PROCEDURE: An ultrasound guided thoracentesis was thoroughly discussed with the patient and questions answered. The benefits, risks, alternatives and complications were also discussed. The patient understands and wishes to proceed with the procedure. Written consent was obtained. Ultrasound was performed to localize and mark an adequate pocket of fluid in the RIGHT chest. The area was then prepped and draped in the normal sterile fashion. 1% Lidocaine was used for local anesthesia. Under ultrasound guidance a 6 Fr Safe-T-Centesis catheter was introduced. Thoracentesis was performed. The catheter was removed and a dressing applied. FINDINGS: A total of approximately 1.1 L of dark red fluid was removed. Samples were sent to the laboratory as requested by the clinical team. IMPRESSION: Successful ultrasound guided RIGHT thoracentesis yielding 1.1 L of pleural fluid. Read by Candiss Norse, PA-C Electronically Signed   By: Michaelle Birks M.D.   On: 04/01/2021 19:20    Labs:  CBC: Recent Labs    04/01/21 0154 04/02/21 0041 04/03/21 0249 04/04/21 0334  WBC 19.7* 23.7* 18.7* 15.5*  HGB 9.2* 8.3* 8.4* 8.4*  HCT 26.4* 24.8* 25.3* 25.8*  PLT 501* 466* 442* 489*  COAGS: Recent Labs    03/28/21 0319  INR 1.2     BMP: Recent Labs    04/01/21 1148 04/02/21 0041 04/03/21 0249 04/04/21 0334  NA 134* 134* 135 137  K 3.9 3.6 3.4* 3.9  CL 97* 97* 97* 101  CO2 23 22 20* 23  GLUCOSE 108* 106* 87 115*  BUN 23 25* 33* 21  CALCIUM 9.0 8.8* 8.9 8.6*  CREATININE  5.38* 5.96* 7.25* 5.24*  GFRNONAA 10* 9* 7* 10*     LIVER FUNCTION TESTS: Recent Labs    03/27/21 1143 03/28/21 0319 03/31/21 1102 04/01/21 1148 04/02/21 0041  BILITOT 1.0 0.9 0.4  --  0.9  AST 35 122* 20  --  16  ALT 6 8 22   --  6  ALKPHOS 191* 409* 133*  --  212*  PROT 6.8 5.3* 7.2  --  5.6*  ALBUMIN 2.4* 1.7* 3.4* 2.0* 1.8*     Assessment and Plan:  Drain to RUQ unremarkable with sutures/stat lock in place. No redness, bleeding or drainage noted. Dressing C/D/I.  Scant blood tinged bile in bag. IR will set up outpt follow up/drain check in several weeks. Will need to flush drain once daily after discharge   Electronically Signed: Ascencion Dike, PA-C 04/04/2021, 11:00 AM   I spent a total of 25 Minutes at the the patient's bedside AND on the patient's hospital floor or unit, greater than 50% of which was counseling/coordinating care for Image guided drain placement, perc chole, 51F pigtail drain 03/28/21 with Dr. Earleen Newport, IR.

## 2021-04-05 LAB — CBC
HCT: 25 % — ABNORMAL LOW (ref 39.0–52.0)
Hemoglobin: 8.6 g/dL — ABNORMAL LOW (ref 13.0–17.0)
MCH: 35 pg — ABNORMAL HIGH (ref 26.0–34.0)
MCHC: 34.4 g/dL (ref 30.0–36.0)
MCV: 101.6 fL — ABNORMAL HIGH (ref 80.0–100.0)
Platelets: 474 10*3/uL — ABNORMAL HIGH (ref 150–400)
RBC: 2.46 MIL/uL — ABNORMAL LOW (ref 4.22–5.81)
RDW: 15.1 % (ref 11.5–15.5)
WBC: 13.2 10*3/uL — ABNORMAL HIGH (ref 4.0–10.5)
nRBC: 0 % (ref 0.0–0.2)

## 2021-04-05 LAB — RENAL FUNCTION PANEL
Albumin: 1.6 g/dL — ABNORMAL LOW (ref 3.5–5.0)
Anion gap: 11 (ref 5–15)
BUN: 27 mg/dL — ABNORMAL HIGH (ref 8–23)
CO2: 22 mmol/L (ref 22–32)
Calcium: 8.8 mg/dL — ABNORMAL LOW (ref 8.9–10.3)
Chloride: 103 mmol/L (ref 98–111)
Creatinine, Ser: 6.37 mg/dL — ABNORMAL HIGH (ref 0.61–1.24)
GFR, Estimated: 8 mL/min — ABNORMAL LOW (ref >60)
Glucose, Bld: 107 mg/dL — ABNORMAL HIGH (ref 70–99)
Phosphorus: 4.3 mg/dL (ref 2.5–4.6)
Potassium: 3.6 mmol/L (ref 3.5–5.1)
Sodium: 136 mmol/L (ref 135–145)

## 2021-04-05 LAB — RESP PANEL BY RT-PCR (FLU A&B, COVID) ARPGX2
Influenza A by PCR: NEGATIVE
Influenza B by PCR: NEGATIVE
SARS Coronavirus 2 by RT PCR: NEGATIVE

## 2021-04-05 MED ORDER — AMOXICILLIN-POT CLAVULANATE 500-125 MG PO TABS: 500.0000 mg | ORAL_TABLET | ORAL | 0 refills | Status: DC

## 2021-04-05 MED ORDER — FLUCONAZOLE 100 MG PO TABS: 100.0000 mg | ORAL_TABLET | Freq: Every day | ORAL | 0 refills | Status: DC

## 2021-04-05 MED ORDER — CINACALCET HCL 30 MG PO TABS: 60.0000 mg | ORAL_TABLET | ORAL | Status: AC

## 2021-04-05 MED ORDER — OXYCODONE HCL 5 MG PO TABS: 5.0000 mg | ORAL_TABLET | Freq: Four times a day (QID) | ORAL | 0 refills | Status: DC | PRN

## 2021-04-05 NOTE — TOC Transition Note (Signed)
Transition of Care [facility]) - CM/SW Discharge Note   Patient Details  Name: Elijah White MRN: [id number] Date of Birth: 1940/06/09  [facility] CM/SW Contact:  Joanne Chars, LCSW Phone Number: 04/05/2021, 1:26 PM   Clinical Narrative:   Pt discharging to [facility].  RN call [phone] for report.  PTAR called [phone], [facility] has not availability.     Final next level of care: Long Term Nursing Home Barriers to Discharge: Barriers Resolved   Patient Goals and CMS Choice Patient states their goals for this hospitalization and ongoing recovery are:: "get back there" (to [facility]) [employer] Compare Post Acute Care list provided to::  (NA-pt from [facility], wants to return)    Discharge Placement              Patient chooses bed at: [facility] Patient to be transferred to facility by: [facility] Name of family member notified: daughter [name] Patient and family notified of of transfer: 04/05/21  Discharge Plan and Services     Post Acute Care Choice: [facility]                               Social Determinants of Health (SDOH) Interventions     Readmission Risk Interventions Readmission Risk Prevention Plan 03/17/2021  Transportation Screening Complete  HRI or Home Care Consult Complete  SW Recovery Care/Counseling Consult Complete  Skilled Nursing Facility Complete  Some recent data might be hidden

## 2021-04-05 NOTE — Progress Notes (Signed)
Contacted by CSW regarding pt's d/c today. Pt to return to snf. Contacted [facility] regarding pt's d/c today and advised clinic pt to resume care on Friday.   Melven Sartorius Renal Navigator [phone]

## 2021-04-05 NOTE — Progress Notes (Signed)
Patient ID: Elijah White, male   DOB: December 01, 1940, 81 y.o.   MRN: [id number] [facility] Progress Note   Assessment/ Plan:   1.  Acute cholecystitis with recurrent abdominal pain: Underwent cholecystostomy tube placement on 03/28/2021 after recent admission to the hospital for the same reason when he transiently had percutaneously cholecystostomy/oral antibiotics.  He appears to be doing clinically better with downtrending leukocytosis and fair abdominal pain control.  Plans noted by interventional radiology for outpatient cholecystostomy tube follow-up.  On Augmentin. 2. ESRD: Continue hemodialysis on his outpatient schedule Monday/Wednesday/Friday.  He is euvolemic and without any significant acute electrolyte abnormality. 3. Anemia: Compounded in the setting of acute illness (cholecystitis) on chronic disease.  Continue to monitor for overt blood loss and ESA.  No indication for PRBC transfusion. 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  On Sensipar/Hectorol for PTH control. 5. Nutrition: On dysphagia 3 diet with ongoing oral nutritional supplementation; significant hypoalbuminemia likely secondary to acute infection/prolonged hospitalization. 6. Hypertension: Soft blood pressures noted, euvolemic on physical exam-monitor on low-dose metoprolol.  Subjective:   Reports that he continues to have intermittent right upper quadrant pain and is not sleeping well at night in the hospital.  Inquires about going home.   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  Left upper arm fistula cannulated  Labs: BMET Recent Labs  Lab 03/30/21 0241 04/01/21 0154 04/01/21 1148 04/02/21 0041 04/03/21 0249 04/04/21 0334 04/05/21 0723  NA 132* 134* 134* 134* 135 137 136  K 4.1 3.7 3.9 3.6 3.4* 3.9 3.6  CL 95* 95* 97* 97* 97* 101 103  CO2 23 23 23 22  20* 23 22  GLUCOSE 76 104* 108* 106* 87 115* 107*  BUN 29* 21 23 25* 33* 21 27*  CREATININE 5.55* 4.85* 5.38* 5.96* 7.25* 5.24* 6.37*  CALCIUM 9.1 8.8* 9.0 8.8* 8.9 8.6* 8.8*  PHOS  --   --  4.9*  --   --   --  4.3   CBC Recent Labs  Lab 04/01/21 0154 04/02/21 0041 04/03/21 0249 04/04/21 0334 04/05/21 0723  WBC 19.7* 23.7* 18.7* 15.5* 13.2*  NEUTROABS 16.9* 20.7* 15.9* 13.5*  --   HGB 9.2* 8.3* 8.4* 8.4* 8.6*  HCT 26.4* 24.8* 25.3* 25.8* 25.0*  MCV 100.0 102.1* 102.8* 102.8* 101.6*  PLT 501* 466* 442* 489* 474*      Medications:     amoxicillin-clavulanate  500 mg of amoxicillin Oral Q24H   atorvastatin  10 mg Oral q AM   brimonidine  1 drop Right Eye TID   carbidopa-levodopa  1 tablet Oral TID   Chlorhexidine Gluconate Cloth  6 each Topical Q0600   cinacalcet  60 mg Oral Q M,W,F-HD   clopidogrel  75 mg Oral Q breakfast   darbepoetin (ARANESP) injection - DIALYSIS  40 mcg Intravenous Q Wed-HD   dorzolamide-timolol  1 drop Right Eye BID   doxercalciferol  3  mcg Intravenous Q M,W,F-HD   gabapentin  100 mg Oral Daily   heparin injection (subcutaneous)  5,000 Units Subcutaneous Q8H   metoprolol tartrate  12.5 mg Oral BID   Netarsudil-Latanoprost  1 drop Right Eye QHS   pantoprazole  40 mg Oral Daily   pilocarpine  1 drop Right Eye QID   polyethylene glycol  17 g Oral Daily   senna  1 tablet Oral BID   sodium chloride flush  5 mL Intracatheter Q8H   Elmarie Shiley, MD 04/05/2021, 10:12 AM

## 2021-04-05 NOTE — Care Management Important Message (Signed)
Important Message  Patient Details  Name: Elijah White MRN: [id number] Date of Birth: 1941/01/17   Medicare Important Message Given:  Yes     [name] 04/05/2021, 1:33 PM

## 2021-04-05 NOTE — Discharge Summary (Signed)
[facility]  Physician Discharge Summary   Patient ID: Elijah White MRN: [id number] DOB/AGE: 83/04/1940 83 y.o.  Admit date: 03/27/2021 Discharge date:   04/05/2021   PCP: Maryella Shivers, MD  DISCHARGE DIAGNOSES:  Principal Problem:   Acute cholecystitis Active Problems:   Type 2 diabetes mellitus with renal manifestations ([place])   Essential hypertension   HLD (hyperlipidemia)   ESRD (end stage renal disease) ([facility])   Macrocytic anemia   RBBB   Blindness   S/P TAVR (transcatheter aortic valve replacement)   Anemia in chronic kidney disease   Secondary hyperparathyroidism of renal origin ([place])   PAD (peripheral artery disease) ([place])   DNR (do not resuscitate)   Pressure injury of skin   RECOMMENDATIONS FOR OUTPATIENT FOLLOW UP: Please check CBC and basic metabolic panel on Monday.  Can be done with dialysis. Interventional radiology to arrange follow-up at the drain clinic Will need follow-up with general surgery, Dr. Ninfa Linden Will need to follow-up with urology before voiding trial. 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  She was recently admitted from 1/11-03/2020 for acute cholecystitis managed with percutaneous cholecystectomy tube which was unintentionally dislodged and subsequently removed on 1/19 and was discharged with oral antibiotics.  After being discharged to nursing facility, he developed severe abdominal pain with associated nausea and vomiting.  On  presentation ,he was afebrile, lab work showed worsening leukocytosis.  Right upper quadrant ultrasound showed distended gallbladder with cholelithiasis, sludge, pericholecystic fluid.  Murphy sign was positive.  IR, general surgery consulted.  Underwent cholecystostomy tube placement by IR on 1/24.  Cultures have not shown any growth.  Hospital course remarkable for persistent nausea, vomiting.  CT chest also showed right-sided pleural effusion, pneumonia needing antibiotics General surgery, IR were following.  Overall condition is looking better now.  Plan for discharge to SNF in next 1 to 2 days  Consultations: Interventional radiology General surgery Nephrology  Procedures:  Cholecystostomy drain  Hemodialysis   HOSPITAL COURSE:   Acute cholecystitis  He was recently admitted from 1/11-03/2020 for acute cholecystitis managed with percutaneous cholecystectomy tube which was unintentionally dislodged and subsequently removed on 1/19 and was discharged with oral antibiotics.  After being discharged to nursing facility, he developed severe abdominal pain with associated nausea and vomiting.  On presentation he was afebrile, lab work showed worsening leukocytosis.  Right upper quadrant ultrasound showed distended gallbladder with cholelithiasis, sludge, pericholecystic fluid. Murphy sign was positive. IR, general surgery consulted.  Underwent cholecystostomy tube placement by IR on 1/24.   Blood cultures no growth till date.  Aerobic/anaerobic culture have not shown any growth, no organisms.Patient was having consistent nausea/vomiting after the procedure.   X-ray of the abdomen done here did not show any bowel obstruction. CT abdomen/pelvis done on 1/27 showed dilated gallbladder. General surgery continued to recommend against surgical intervention, continue right upper quadrant drain. CT abdomen/pelvis with contrast did not show any acute intra-abdominal pelvic findings. Nausea has improved.   Abdominal pain is better.  He is tolerating his diet.  He is afebrile.  WBC  improved.  Okay for discharge today.   Bilateral pleural effusion/Right-sided pneumonia Chest x-ray showed  to large RIGHT and small LEFT pleural effusions with associated RIGHT basilar atelectasis .  Currently on room air.  Underwent right sided  ultrasound-guided thoracentesis with removal of 1.1 L of dark red fluid. No organisms seen on the pleural fluid.  Cultures without any growth. CT imaging showed pneumonia on the right side.  He was having productive cough.  He was  on vancomycin ,cefepime.  Leukocytosis has improved.  Antibiotics changed to oral.   ESRD: On dialysis on MWF.     Paroxysmal A. fib: Currently in normal sinus rhythm.  Not on anticoagulation due to concern for GI bleed.  On low-dose metoprolol for rate control.     Non-insulin-dependent diabetes type 2: Recent A1c of 7%.  Continue just sliding scale coverage due to borderline low blood sugars. On gabapentin for diabetic neuropathy.   History of glaucoma: Continue eyedrops   Parkinson's disease: Continue Sinemet.  Delirium precautions   History of peripheral vascular disease : status post left BKA.  Takes Plavix , continued   History of urinary retention: On last admission Foley was placed and he was discharged with Foley.  Continue Foley catheter for now.  We recommend to follow-up with urology as an outpatient   Macrocytic anemia/esophageal candidiasis: Associated with ESRD.  No evidence of bleeding.  Hemoglobin stable.  He underwent EGD on last admission.  Biopsies showed candidiasis.  He has been started on 7 days course of fluconazole.     Hyponatremia: Likely from volume overload.     Pressure injury stage II Pressure Injury 03/28/21 Coccyx Medial Stage 2 -  Partial thickness loss of dermis presenting as a shallow open injury with a red, pink wound bed without slough. wound bed is pink, no drainage noted (Active)  03/28/21 1028   Location: Coccyx  Location Orientation: Medial  Staging: Stage 2 -  Partial thickness loss of dermis presenting as a shallow open injury with a red, pink wound bed without slough.  Wound Description (Comments): wound bed is pink, no drainage noted  Present on Admission: Yes      Patient is stable.  Okay for discharge to SNF.    PERTINENT LABS:  The results of significant diagnostics from this hospitalization (including imaging, microbiology, ancillary and laboratory) are listed below for reference.    Microbiology: Recent Results (from the past 240 hour(s))  Resp Panel by RT-PCR (Flu A&B, Covid) Nasopharyngeal Swab     Status: None   Collection Time: 03/26/21 11:56 AM   Specimen: Nasopharyngeal Swab; Nasopharyngeal(NP) swabs in vial transport medium  Result Value Ref Range Status   SARS Coronavirus 2 by RT PCR NEGATIVE NEGATIVE Final    Comment: (NOTE) SARS-CoV-2 target nucleic acids are NOT DETECTED.  The SARS-CoV-2 RNA is generally detectable in upper respiratory specimens during the acute phase of infection. The lowest concentration of SARS-CoV-2 viral copies this assay can detect is 138 copies/mL. A negative result does not preclude SARS-Cov-2 infection and should not be used as the sole basis for treatment or other patient management decisions. A negative result may occur with  improper specimen collection/handling, submission of specimen other than nasopharyngeal swab, presence of viral mutation(s) within the areas targeted by this assay, and inadequate number of viral copies(<138 copies/mL). A negative result must be combined with clinical observations, patient history, and epidemiological information. The expected result is Negative.  Fact Sheet for Patients:  EntrepreneurPulse.com.au  Fact Sheet  for Healthcare Providers:  IncredibleEmployment.be  This test is no t yet approved or cleared by the [place] and  has been  authorized for detection and/or diagnosis of SARS-CoV-2 by FDA under an Emergency Use Authorization (EUA). This EUA will remain  in effect (meaning this test can be used) for the duration of the COVID-19 declaration under Section 564(b)(1) of the Act, 21 U.S.C.section 360bbb-3(b)(1), unless the authorization is terminated  or revoked sooner.       Influenza A by PCR NEGATIVE NEGATIVE Final   Influenza B by PCR NEGATIVE NEGATIVE Final    Comment: (NOTE) The Xpert Xpress SARS-CoV-2/FLU/RSV plus assay is intended as an aid in the diagnosis of influenza from Nasopharyngeal swab specimens and should not be used as a sole basis for treatment. Nasal washings and aspirates are unacceptable for Xpert Xpress SARS-CoV-2/FLU/RSV testing.  Fact Sheet for Patients: EntrepreneurPulse.com.au  Fact Sheet for Healthcare Providers: IncredibleEmployment.be  This test is not yet approved or cleared by the [place] FDA and has been authorized for detection and/or diagnosis of SARS-CoV-2 by FDA under an Emergency Use Authorization (EUA). This EUA will remain in effect (meaning this test can be used) for the duration of the COVID-19 declaration under Section 564(b)(1) of the Act, 21 U.S.C. section 360bbb-3(b)(1), unless the authorization is terminated or revoked.  Performed at [facility], [place] [address], [place], [place] [address]   Resp Panel by RT-PCR (Flu A&B, Covid) Nasopharyngeal Swab     Status: None   Collection Time: 03/27/21  2:10 PM   Specimen: Nasopharyngeal Swab; Nasopharyngeal(NP) swabs in vial transport medium  Result Value Ref Range Status   SARS Coronavirus 2 by RT PCR NEGATIVE NEGATIVE Final    Comment: (NOTE) SARS-CoV-2 target nucleic acids are NOT DETECTED.  The SARS-CoV-2 RNA is generally detectable in upper respiratory specimens during the acute phase of infection. The lowest concentration of SARS-CoV-2 viral copies this assay  can detect is 138 copies/mL. A negative result does not preclude SARS-Cov-2 infection and should not be used as the sole basis for treatment or other patient management decisions. A negative result may occur with  improper specimen collection/handling, submission of specimen other than nasopharyngeal swab, presence of viral mutation(s) within the areas targeted by this assay, and inadequate number of viral copies(<138 copies/mL). A negative result must be combined with clinical observations, patient history, and epidemiological information. The expected result is Negative.  Fact Sheet for Patients:  EntrepreneurPulse.com.au  Fact Sheet for Healthcare Providers:  IncredibleEmployment.be  This test is no t yet approved or cleared by the [place] FDA and  has been authorized for detection and/or diagnosis of SARS-CoV-2 by FDA under an Emergency Use Authorization (EUA). This EUA will remain  in effect (meaning this test can be used) for the duration of the COVID-19 declaration under Section 564(b)(1) of the Act, 21 U.S.C.section 360bbb-3(b)(1), unless the authorization is terminated  or revoked sooner.       Influenza A by PCR NEGATIVE NEGATIVE Final   Influenza B by PCR NEGATIVE NEGATIVE Final    Comment: (NOTE) The Xpert Xpress SARS-CoV-2/FLU/RSV plus assay is intended as an aid in the diagnosis of influenza from Nasopharyngeal swab specimens and should not be used as a sole basis for treatment. Nasal washings and aspirates are unacceptable for Xpert Xpress SARS-CoV-2/FLU/RSV testing.  Fact Sheet for Patients: EntrepreneurPulse.com.au  Fact Sheet for Healthcare Providers: IncredibleEmployment.be  This test is not yet approved or cleared by the [place] FDA  and has been authorized for detection and/or diagnosis of SARS-CoV-2 by FDA under an Emergency Use Authorization (EUA). This EUA will  remain in effect (meaning this test can be used) for the duration of the COVID-19 declaration under Section 564(b)(1) of the Act, 21 U.S.C. section 360bbb-3(b)(1), unless the authorization is terminated or revoked.  Performed at [facility], [place] [address], [place], [place] [address]   Culture, blood (routine x 2)     Status: None   Collection Time: 03/27/21  4:03 PM   Specimen: BLOOD  Result Value Ref Range Status   Specimen Description BLOOD BLOOD RIGHT ARM  Final   Special Requests   Final    BOTTLES DRAWN AEROBIC AND ANAEROBIC Blood Culture results may not be optimal due to an excessive volume of blood received in culture bottles   Culture   Final    NO GROWTH 5 DAYS Performed at [facility], [place] [address] [place], [place] [address]    Report Status 04/01/2021 FINAL  Final  Culture, blood (routine x 2)     Status: None   Collection Time: 03/27/21  6:49 PM   Specimen: BLOOD RIGHT HAND  Result Value Ref Range Status   Specimen Description BLOOD RIGHT HAND  Final   Special Requests   Final    BOTTLES DRAWN AEROBIC AND ANAEROBIC Blood Culture adequate volume   Culture   Final    NO GROWTH 5 DAYS Performed at [facility], [place] [address], [place], [place] [address]    Report Status 04/01/2021 FINAL  Final  Aerobic/Anaerobic Culture w Gram Stain (surgical/deep wound)     Status: None   Collection Time: 03/28/21  5:10 PM   Specimen: Abscess  Result Value Ref Range Status   Specimen Description ABSCESS  Final   Special Requests GALLBLADDER  Final   Gram Stain   Final    FEW WBC PRESENT,BOTH PMN AND MONONUCLEAR NO ORGANISMS SEEN    Culture   Final    No growth aerobically or anaerobically. Performed at [facility], [place] [address], [place], [place] [address]    Report Status 04/02/2021 FINAL  Final  Body fluid culture w Gram Stain     Status: None   Collection Time: 04/01/21  1:37 PM   Specimen: Lung, Right Lower Lobe; Pleural Fluid  Result  Value Ref Range Status   Specimen Description PLEURAL FLUID  Final   Special Requests LUNG RIGHT LOWER LOBE  Final   Gram Stain   Final    RARE WBC PRESENT,BOTH PMN AND MONONUCLEAR NO ORGANISMS SEEN    Culture   Final    NO GROWTH Performed at [facility], [place] [address] [place], [place] [address]    Report Status 04/04/2021 FINAL  Final  Culture, blood (routine x 2)     Status: None (Preliminary result)   Collection Time: 04/02/21  9:57 AM   Specimen: BLOOD RIGHT WRIST  Result Value Ref Range Status   Specimen Description BLOOD RIGHT WRIST  Final   Special Requests   Final    BOTTLES DRAWN AEROBIC AND ANAEROBIC Blood Culture results may not be optimal due to an inadequate volume of blood received in culture bottles   Culture   Final    NO GROWTH 2 DAYS Performed at [facility], [place] [address] [place], [place] [address]    Report Status PENDING  Incomplete  Culture, blood (routine x 2)  Status: None (Preliminary result)   Collection Time: 04/02/21  9:57 AM   Specimen: BLOOD RIGHT HAND  Result Value Ref Range Status   Specimen Description BLOOD RIGHT HAND  Final   Special Requests   Final    BOTTLES DRAWN AEROBIC AND ANAEROBIC Blood Culture results may not be optimal due to an inadequate volume of blood received in culture bottles   Culture   Final    NO GROWTH 2 DAYS Performed at [facility], [place] [address] [place], [place] [address]    Report Status PENDING  Incomplete  MRSA Next Gen by PCR, Nasal     Status: None   Collection Time: 04/03/21  9:50 AM   Specimen: Nasal Mucosa; Nasal Swab  Result Value Ref Range Status   MRSA by PCR Next Gen NOT DETECTED NOT DETECTED Final    Comment: (NOTE) The GeneXpert MRSA Assay (FDA approved for NASAL specimens only), is one component of a comprehensive MRSA colonization surveillance program. It is not intended to diagnose MRSA infection nor to guide or monitor treatment for MRSA infections. Test  performance is not FDA approved in patients less than 64 years old. Performed at [facility], [place] [address], [place],  [address]      Labs:  COVID-19 Labs    Lab Results  Component Value Date   SARSCOV2NAA NEGATIVE 03/27/2021   [place] NEGATIVE 03/26/2021   [place] NEGATIVE 03/22/2021   [place] NEGATIVE 03/15/2021      Basic Metabolic Panel: Recent Labs  Lab 04/01/21 1148 04/02/21 0041 04/03/21 0249 04/04/21 0334 04/05/21 0723  NA 134* 134* 135 137 136  K 3.9 3.6 3.4* 3.9 3.6  CL 97* 97* 97* 101 103  CO2 23 22 20* 23 22  GLUCOSE 108* 106* 87 115* 107*  BUN 23 25* 33* 21 27*  CREATININE 5.38* 5.96* 7.25* 5.24* 6.37*  CALCIUM 9.0 8.8* 8.9 8.6* 8.8*  PHOS 4.9*  --   --   --  4.3   Liver Function Tests: Recent Labs  Lab 03/31/21 1102 04/01/21 1148 04/02/21 0041 04/05/21 0723  AST 20  --  16  --   ALT 22  --  6  --   ALKPHOS 133*  --  212*  --   BILITOT 0.4  --  0.9  --   PROT 7.2  --  5.6*  --   ALBUMIN 3.4* 2.0* 1.8* 1.6*   Recent Labs  Lab 03/31/21 1102  LIPASE 29    CBC: Recent Labs  Lab 03/31/21 0836 04/01/21 0154 04/02/21 0041 04/03/21 0249 04/04/21 0334 04/05/21 0723  WBC 13.7* 19.7* 23.7* 18.7* 15.5* 13.2*  NEUTROABS 11.0* 16.9* 20.7* 15.9* 13.5*  --   HGB 9.5* 9.2* 8.3* 8.4* 8.4* 8.6*  HCT 28.6* 26.4* 24.8* 25.3* 25.8* 25.0*  MCV 101.1* 100.0 102.1* 102.8* 102.8* 101.6*  PLT 506* 501* 466* 442* 489* 474*    CBG: Recent Labs  Lab 04/01/21 0821 04/01/21 1200 04/01/21 1552 04/01/21 2048 04/03/21 1707  GLUCAP 96 101* 104* 109* 96     IMAGING STUDIES CT ABDOMEN PELVIS WO CONTRAST  Result Date: 03/31/2021 CLINICAL DATA:  Nausea and vomiting.  On dialysis. EXAM: CT ABDOMEN AND PELVIS WITHOUT CONTRAST TECHNIQUE: Multidetector CT imaging of the abdomen and pelvis was performed following the standard protocol without IV contrast. RADIATION DOSE REDUCTION: This exam was performed according to the  departmental dose-optimization program which includes automated exposure control, adjustment of the mA and/or kV according to patient  size and/or use of iterative reconstruction technique. COMPARISON:  Abdominal x-ray 03/31/2021. CT abdomen and pelvis 03/23/2021. FINDINGS: Lower chest: There is a moderate-sized right pleural effusion which has mildly increased. There is compressive atelectasis of the right lower lobe. There is a small left pleural effusion which appears stable. TAVR present. Hepatobiliary: The gallbladder is dilated and thick-walled. Heterogeneous hyperdensity is again seen within the gallbladder likely related to hemorrhage. Small layering gallstones are again noted. There is a new percutaneous cholecystostomy tube in place. There is a small amount of air in the gallbladder likely related to tube placement. There is no definite biliary ductal dilatation. No focal liver lesions are seen. Pancreas: Unremarkable. No pancreatic ductal dilatation or surrounding inflammatory changes. Spleen: Normal in size without focal abnormality. Adrenals/Urinary Tract: 2.5 x 2.1 cm left adrenal nodule is indeterminate and unchanged. Right adrenal gland within normal limits. There is bilateral renal atrophy. No urinary tract calculi or hydronephrosis. Bladder is decompressed by Foley catheter. Stomach/Bowel: Stomach is within normal limits. Appendix appears normal. No evidence of bowel wall thickening, distention, or inflammatory changes. Vascular/Lymphatic: Aortic atherosclerosis. No enlarged abdominal or pelvic lymph nodes. Reproductive: Prostate radiotherapy seeds are again noted. Other: There is a small amount of free fluid in the pelvis similar to prior study. There is a small fat containing left inguinal hernia. Mild body wall edema persists. There is also a fat containing umbilical hernia. Musculoskeletal: No acute or significant osseous findings. IMPRESSION: 1. New cholecystostomy tube in place. 2. The  gallbladder is dilated. Cholelithiasis again seen. Heterogeneous hyperdensity in the gallbladder likely represents hemorrhage, unchanged. 3. Moderate right pleural effusion has increased. Small left pleural effusion is stable. 4. Small volume ascites and body wall edema are stable. 5.  Aortic Atherosclerosis (ICD10-I70.0). Electronically Signed   By: Ronney Asters M.D.   On: 03/31/2021 18:22   CT ABDOMEN PELVIS WO CONTRAST  Result Date: 03/23/2021 CLINICAL DATA:  Postoperative RIGHT upper quadrant pain, headache ups, fall in hemoglobin, post percutaneous cholecystostomy on 03/16/2021 with removal of biliary drain on 03/21/2021 EXAM: CT ABDOMEN AND PELVIS WITHOUT CONTRAST TECHNIQUE: Multidetector CT imaging of the abdomen and pelvis was performed following the standard protocol without IV contrast. RADIATION DOSE REDUCTION: This exam was performed according to the departmental dose-optimization program which includes automated exposure control, adjustment of the mA and/or kV according to patient size and/or use of iterative reconstruction technique. COMPARISON:  03/17/2021 FINDINGS: Lower chest: Small BILATERAL pleural effusions, RIGHT greater than LEFT. Significant atelectasis of RIGHT lower lobe. Minimal compressive atelectasis posterior LEFT lower lobe. Coronary arterial calcifications noted with evidence of TAVR. Hepatobiliary: Interval removal of cholecystostomy tube. Dependent calculi within gallbladder with additional new intermediate attenuation material within gallbladder lumen likely representing blood. Gallbladder remains distended. Liver unremarkable. Pancreas: Normal appearance Spleen: Normal appearance.  Small splenule adjacent to spleen. Adrenals/Urinary Tract: 2.5 x 2.1 cm diameter homogeneous LEFT adrenal mass, 32 HU attenuation on noncontrast imaging, probable adrenal adenoma. RIGHT adrenal gland unremarkable. No renal mass or hydronephrosis. Foley catheter decompresses urinary bladder. No  ureteral dilatation. Stomach/Bowel: Normal appendix. Stomach and bowel loops normal appearance. Vascular/Lymphatic: Atherosclerotic calcifications aorta and iliac arteries. No adenopathy. Reproductive: Seed implants at prostate gland. Other: Small amount of low-attenuation free fluid in pelvis and perihepatic. No free air. Small umbilical hernia containing fat. Musculoskeletal: Bones demineralized. IMPRESSION: Interval removal of cholecystostomy tube. Persistent gallbladder distention with dependent calculi and additional intermediate attenuation material within gallbladder lumen likely representing blood/hemorrhage. Small amount of nonspecific low-attenuation free intraperitoneal fluid perihepatic  and in pelvis. 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 Small BILATERAL pleural effusions and atelectasis greater on RIGHT. Small umbilical hernia containing fat. Aortic Atherosclerosis (ICD10-I70.0). Electronically Signed   By: Lavonia Dana M.D.   On: 03/23/2021 13:34   DG Chest 1 View  Result Date: 03/15/2021 CLINICAL DATA:  Abdominal pain and vomiting. Clinical concern for aspiration. EXAM: CHEST  1 VIEW COMPARISON:  06/24/2020 FINDINGS: Normal sized heart. Interval minimal patchy density at the right lung base. The left lung remains clear. Aortic valve stent. Thoracic spine degenerative changes. Left axillary surgical clip. IMPRESSION: Minimal right basilar atelectasis, pneumonia or aspiration pneumonitis. Electronically Signed   By: Claudie Revering M.D.   On: 03/15/2021 14:07   DG Chest 2 View  Result Date: 03/27/2021 CLINICAL DATA:  Assess, abdominal pain, nausea and vomiting since yesterday, recent hospitalization for cholecystitis and complicated UTI EXAM: CHEST - 2 VIEW COMPARISON:  03/17/2021 FINDINGS: Normal heart size post TAVR. Mediastinal contours and pulmonary vascularity normal. Moderate to large RIGHT pleural effusion with small LEFT pleural effusion. Mild RIGHT basilar atelectasis and central  peribronchial thickening. Upper lungs clear. No pneumothorax or acute osseous findings. IMPRESSION: Moderate to large RIGHT and small LEFT pleural effusions with associated RIGHT basilar atelectasis. Electronically Signed   By: Lavonia Dana M.D.   On: 03/27/2021 15:20   CT CHEST W CONTRAST  Result Date: 04/02/2021 CLINICAL DATA:  Acute abdominal pain.  Pneumonia. EXAM: CT CHEST, ABDOMEN, AND PELVIS WITH CONTRAST TECHNIQUE: Multidetector CT imaging of the chest, abdomen and pelvis was performed following the standard protocol during bolus administration of intravenous contrast. RADIATION DOSE REDUCTION: This exam was performed according to the departmental dose-optimization program which includes automated exposure control, adjustment of the mA and/or kV according to patient size and/or use of iterative reconstruction technique. CONTRAST:  128mL OMNIPAQUE IOHEXOL 350 MG/ML SOLN COMPARISON:  CT abdomen and pelvis 03/31/2021. CT angiogram chest abdomen and pelvis 12/01/2019. FINDINGS: CT CHEST FINDINGS Cardiovascular: The heart is mildly enlarged. There is no pericardial effusion. Aorta is normal in size. Patient is status post TAVR. There are atherosclerotic calcifications of the aorta and coronary arteries. Mediastinum/Nodes: No enlarged mediastinal, hilar, or axillary lymph nodes. Thyroid gland, trachea, and esophagus demonstrate no significant findings. Lungs/Pleura: There are small bilateral pleural effusions, right greater than left. Right pleural effusion has decreased from the prior examination. Right pleural effusion is likely partially loculated posteriorly. There is also small amount of loculated fluid in the right major fissure. There are patchy ground-glass and airspace opacities in the right upper lobe and right lower lobe. There is atelectasis in the left lower lobe. There is no evidence for pneumothorax. There is some secretions in the trachea. Trachea and central airways appear patent. There is a  stable 3 mm nodule in the left upper lobe image 4/52. No new pulmonary nodules are identified. Musculoskeletal: No acute fractures. Degenerative changes affect the spine. CT ABDOMEN PELVIS FINDINGS Hepatobiliary: Percutaneous cholecystostomy tube is again seen. The gallbladder is dilated similar to the prior study. There is a small amount of air in the gallbladder. Layering small stones are unchanged. Heterogeneous hyperdensity throughout the gallbladder likely represents hemorrhagic products, also unchanged. There is no biliary ductal dilatation. No focal liver lesions are seen. Pancreas: Unremarkable. No pancreatic ductal dilatation or surrounding inflammatory changes. Spleen: Normal in size without focal abnormality. Adrenals/Urinary Tract: 2.5 cm left adrenal nodule is indeterminate and unchanged. Right adrenal gland is within normal limits. The bladder is decompressed by Foley catheter.  There is no hydronephrosis. There is a cortical hypodensity in the superior pole the left kidney which is too small to characterize, likely cysts. Stomach/Bowel: Appendix appears normal. No evidence of bowel wall thickening, distention, or inflammatory changes. There is scattered air-fluid levels throughout the colon. The stomach is completely decompressed and gastric wall thickening can not be excluded. Vascular/Lymphatic: Aortic atherosclerosis. No enlarged abdominal or pelvic lymph nodes. Reproductive: Prostate radiotherapy seeds are present. Other: There is a small fat containing umbilical hernia. There is trace ascites, decreased from prior. There is mild body wall edema which is similar to the prior study. Musculoskeletal: No acute or significant osseous findings. IMPRESSION: 1. Small right pleural effusion has decreased in size. This effusion is partially loculated posteriorly and within the major fissure. Loculation may be secondary to chronicity, hemorrhage or malignancy. 2. Patchy ground-glass and airspace opacities in  the right upper lobe and right lower lobe most consistent with pneumonia. 3. Stable small left pleural effusion. 4. The gallbladder is dilated. Cholecystostomy tube is in place. Gallbladder air, hemorrhagic products and gallstones are present. Appearance is unchanged from the prior study. 5. Stable body wall edema and trace ascites. 6. Stable indeterminate left adrenal nodule. 7. Stable 3 mm left upper lobe pulmonary nodule. No follow-up needed if patient is low-risk. Non-contrast chest CT can be considered in 12 months if patient is high-risk. This recommendation follows the consensus statement: Guidelines for Management of Incidental Pulmonary Nodules Detected on CT Images: From the Fleischner Society 2017; Radiology 2017; 284:228-243. Electronically Signed   By: Ronney Asters M.D.   On: 04/02/2021 21:11   NM Hepatobiliary Liver Func  Result Date: 03/28/2021 CLINICAL DATA:  Ultrasound CT findings concern for acute cholecystitis. EXAM: NUCLEAR MEDICINE HEPATOBILIARY IMAGING TECHNIQUE: Sequential images of the abdomen were obtained out to 60 minutes following intravenous administration of radiopharmaceutical. RADIOPHARMACEUTICALS:  5.3 mCi Tc-41m  Choletec IV COMPARISON:  Ultrasound 03/27/2021, CT 03/23/2021 FINDINGS: Prompt clearance radiotracer from blood pool and homogeneous uptake within liver. Counts are present within the common bile duct by 15 minutes. Counts reached the proximal small bowel by 20 minutes. No filling of the gallbladder at 60 minutes. IV morphine was administered to augment filling of the gallbladder. No filling of the gallbladder present on 30 minutes of post morphine imaging. IMPRESSION: Non filling the gallbladder is consistent with ACUTE CHOLECYSTITIS. Patent common bile duct. These results will be called to the ordering clinician or representative by the Radiologist Assistant, and communication documented in the PACS or [employer]. Electronically Signed   By: Suzy Bouchard  M.D.   On: 03/28/2021 16:02   CT ABDOMEN PELVIS W CONTRAST  Result Date: 04/02/2021 CLINICAL DATA:  Acute abdominal pain.  Pneumonia. EXAM: CT CHEST, ABDOMEN, AND PELVIS WITH CONTRAST TECHNIQUE: Multidetector CT imaging of the chest, abdomen and pelvis was performed following the standard protocol during bolus administration of intravenous contrast. RADIATION DOSE REDUCTION: This exam was performed according to the departmental dose-optimization program which includes automated exposure control, adjustment of the mA and/or kV according to patient size and/or use of iterative reconstruction technique. CONTRAST:  143mL OMNIPAQUE IOHEXOL 350 MG/ML SOLN COMPARISON:  CT abdomen and pelvis 03/31/2021. CT angiogram chest abdomen and pelvis 12/01/2019. FINDINGS: CT CHEST FINDINGS Cardiovascular: The heart is mildly enlarged. There is no pericardial effusion. Aorta is normal in size. Patient is status post TAVR. There are atherosclerotic calcifications of the aorta and coronary arteries. Mediastinum/Nodes: No enlarged mediastinal, hilar, or axillary lymph nodes. Thyroid gland, trachea, and esophagus  demonstrate no significant findings. Lungs/Pleura: There are small bilateral pleural effusions, right greater than left. Right pleural effusion has decreased from the prior examination. Right pleural effusion is likely partially loculated posteriorly. There is also small amount of loculated fluid in the right major fissure. There are patchy ground-glass and airspace opacities in the right upper lobe and right lower lobe. There is atelectasis in the left lower lobe. There is no evidence for pneumothorax. There is some secretions in the trachea. Trachea and central airways appear patent. There is a stable 3 mm nodule in the left upper lobe image 4/52. No new pulmonary nodules are identified. Musculoskeletal: No acute fractures. Degenerative changes affect the spine. CT ABDOMEN PELVIS FINDINGS Hepatobiliary: Percutaneous  cholecystostomy tube is again seen. The gallbladder is dilated similar to the prior study. There is a small amount of air in the gallbladder. Layering small stones are unchanged. Heterogeneous hyperdensity throughout the gallbladder likely represents hemorrhagic products, also unchanged. There is no biliary ductal dilatation. No focal liver lesions are seen. Pancreas: Unremarkable. No pancreatic ductal dilatation or surrounding inflammatory changes. Spleen: Normal in size without focal abnormality. Adrenals/Urinary Tract: 2.5 cm left adrenal nodule is indeterminate and unchanged. Right adrenal gland is within normal limits. The bladder is decompressed by Foley catheter. There is no hydronephrosis. There is a cortical hypodensity in the superior pole the left kidney which is too small to characterize, likely cysts. Stomach/Bowel: Appendix appears normal. No evidence of bowel wall thickening, distention, or inflammatory changes. There is scattered air-fluid levels throughout the colon. The stomach is completely decompressed and gastric wall thickening can not be excluded. Vascular/Lymphatic: Aortic atherosclerosis. No enlarged abdominal or pelvic lymph nodes. Reproductive: Prostate radiotherapy seeds are present. Other: There is a small fat containing umbilical hernia. There is trace ascites, decreased from prior. There is mild body wall edema which is similar to the prior study. Musculoskeletal: No acute or significant osseous findings. IMPRESSION: 1. Small right pleural effusion has decreased in size. This effusion is partially loculated posteriorly and within the major fissure. Loculation may be secondary to chronicity, hemorrhage or malignancy. 2. Patchy ground-glass and airspace opacities in the right upper lobe and right lower lobe most consistent with pneumonia. 3. Stable small left pleural effusion. 4. The gallbladder is dilated. Cholecystostomy tube is in place. Gallbladder air, hemorrhagic products and  gallstones are present. Appearance is unchanged from the prior study. 5. Stable body wall edema and trace ascites. 6. Stable indeterminate left adrenal nodule. 7. Stable 3 mm left upper lobe pulmonary nodule. No follow-up needed if patient is low-risk. Non-contrast chest CT can be considered in 12 months if patient is high-risk. This recommendation follows the consensus statement: Guidelines for Management of Incidental Pulmonary Nodules Detected on CT Images: From the Fleischner Society 2017; Radiology 2017; 284:228-243. Electronically Signed   By: Ronney Asters M.D.   On: 04/02/2021 21:11   CT ABDOMEN PELVIS W CONTRAST  Result Date: 03/17/2021 CLINICAL DATA:  Acute generalized abdominal pain. EXAM: CT ABDOMEN AND PELVIS WITH CONTRAST TECHNIQUE: Multidetector CT imaging of the abdomen and pelvis was performed using the standard protocol following bolus administration of intravenous contrast. RADIATION DOSE REDUCTION: This exam was performed according to the departmental dose-optimization program which includes automated exposure control, adjustment of the mA and/or kV according to patient size and/or use of iterative reconstruction technique. CONTRAST:  37mL OMNIPAQUE IOHEXOL 300 MG/ML  SOLN COMPARISON:  March 15, 2021. FINDINGS: Lower chest: Right lower lobe pneumonia or atelectasis is noted. Hepatobiliary: Interval placement  of percutaneous cholecystostomy tube is noted. Continued gallbladder distention and cholelithiasis is noted. No biliary dilatation is noted. The liver is unremarkable. Pancreas: Unremarkable. No pancreatic ductal dilatation or surrounding inflammatory changes. Spleen: Normal in size without focal abnormality. Adrenals/Urinary Tract: Stable 2.6 cm left adrenal nodule is noted. Stable right adrenal gland enlargement is noted. Mild bilateral renal atrophy is noted. No nephrolithiasis is noted. Moderate right hydroureteronephrosis is noted without obstructing calculus. Stable dilatation of  left renal pelvis is noted without calyceal dilatation. Mild left ureteral dilatation is noted. No obstructing calculus is noted. Mild urinary bladder distention is noted. Stomach/Bowel: Stomach is within normal limits. Appendix appears normal. No evidence of bowel wall thickening, distention, or inflammatory changes. Vascular/Lymphatic: Aortic atherosclerosis. No enlarged abdominal or pelvic lymph nodes. Reproductive: Status post prostatic brachytherapy seed placement. Other: No abdominal wall hernia or abnormality. No abdominopelvic ascites. Musculoskeletal: No acute or significant osseous findings. IMPRESSION: Interval development of right lower lobe opacity is noted concerning for pneumonia or atelectasis. Interval placement of percutaneous cholecystostomy tube in grossly good position. Stable gallbladder distension and cholelithiasis is noted. Grossly stable moderate right hydroureteronephrosis is noted as well as prominent left renal pelvis and mild distal left ureteral dilatation, without evidence of obstructing calculus. Mild urinary bladder distention is noted. 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 Aortic Atherosclerosis (ICD10-I70.0). Electronically Signed   By: Marijo Conception M.D.   On: 03/17/2021 09:41   US Abdomen Limited  Result Date: 03/15/2021 CLINICAL DATA:  Right upper quadrant pain EXAM: ULTRASOUND ABDOMEN LIMITED RIGHT UPPER QUADRANT COMPARISON:  None. FINDINGS: Gallbladder: Distended with sludge and calculi measuring up to 4 mm. No wall thickening visualized. No pericholecystic fluid. No sonographic Murphy sign noted by sonographer. Common bile duct: Diameter: 3 mm, normal Liver: Left lobe of liver is poorly visualized due to bowel gas. No focal lesion identified. Within normal limits in parenchymal echogenicity. Portal vein is patent on color Doppler imaging with normal direction of blood flow towards the liver. Other: None.  IMPRESSION: Distended gallbladder with sludge and calculi but no additional sonographic evidence of acute cholecystitis. Electronically Signed   By: Macy Mis M.D.   On: 03/15/2021 11:40   IR Perc Cholecystostomy  Result Date: 03/28/2021 INDICATION: 81 year old male referred for percutaneous cholecystostomy EXAM: CHOLECYSTOSTOMY MEDICATIONS: 2 g Mefoxin; The antibiotic was administered within an appropriate time frame prior to the initiation of the procedure. ANESTHESIA/SEDATION: Moderate (conscious) sedation was employed during this procedure. A total of Versed 1.0 mg and Fentanyl 25 mcg was administered intravenously. Moderate Sedation Time: 13 minutes. The patient's level of consciousness and vital signs were monitored continuously by radiology nursing throughout the procedure under my direct supervision. FLUOROSCOPY TIME:  Fluoroscopy Time: 0 minutes 42 seconds (3 mGy). COMPLICATIONS: None PROCEDURE: Informed written consent was obtained from the patient and the patient's family after a thorough discussion of the procedural risks, benefits and alternatives. All questions were addressed. Maximal Sterile Barrier Technique was utilized including caps, mask, sterile gowns, sterile gloves, sterile drape, hand hygiene and skin antiseptic. A timeout was performed prior to the initiation of the procedure. Ultrasound survey of the right upper quadrant was performed for planning purposes. Once the patient is prepped and draped in the usual sterile fashion, the skin and subcutaneous tissues overlying the gallbladder were generously infiltrated 1% lidocaine for local anesthesia. A coaxial needle was advanced under ultrasound guidance through the skin subcutaneous tissues and a small segment of liver into the gallbladder lumen. With  removal of the stylet, spontaneous dark bile drainage occurred. Using modified Seldinger technique, a 10 French drain was placed into the gallbladder fossa, with aspiration of the sample  for the lab. Contrast injection confirmed position of the tube within the gallbladder lumen. Drainage catheter was attached to gravity drain with a suture retention placed. Patient tolerated the procedure well and remained hemodynamically stable throughout. No complications were encountered and no significant blood loss encountered. IMPRESSION: Status post image guided percutaneous cholecystostomy. Signed, Dulcy Fanny. Dellia Nims, RPVI [facility] Electronically Signed   By: Corrie Mckusick D.O.   On: 03/28/2021 17:26   IR Perc Cholecystostomy  Result Date: 03/16/2021 INDICATION: 81 year old male with right upper quadrant pain and imaging findings concerning for acute calculus cholecystitis. A nuclear medicine HIDA scan was ordered to confirm, however patient's clinical status has deteriorated overnight with significant increase in leukocytosis, new oxygen requirement and increasing pain. Therefore, we are proceeding directly with percutaneous transhepatic cholecystostomy tube placement. EXAM: CHOLECYSTOSTOMY MEDICATIONS: In patient currently on intravenous Zosyn. No additional antibiotic prophylaxis administered. ANESTHESIA/SEDATION: Moderate (conscious) sedation was employed during this procedure. A total of Versed 0.5 mg and Fentanyl 25 mcg and 0.5 mg Dilaudid was administered intravenously. Moderate Sedation Time: 10 minutes. The patient's level of consciousness and vital signs were monitored continuously by radiology nursing throughout the procedure under my direct supervision. FLUOROSCOPY TIME:  Fluoroscopy Time: 0 minutes 36 seconds (2 mGy). COMPLICATIONS: None immediate. PROCEDURE: Informed written consent was obtained from the patient after a thorough discussion of the procedural risks, benefits and alternatives. All questions were addressed. Maximal Sterile Barrier Technique was utilized including caps, mask, sterile gowns, sterile gloves, sterile  drape, hand hygiene and skin antiseptic. A timeout was performed prior to the initiation of the procedure. The right upper quadrant was interrogated with ultrasound. The gallbladder is markedly distended. A suitable skin entry site was selected and marked. The overlying skin was sterilely prepped and draped in the standard fashion using chlorhexidine skin prep. Local anesthesia was attained by infiltration with 1% lidocaine. A small dermatotomy was made. Under real-time ultrasound guidance, a 21 gauge Accustick needle was advanced along a short transhepatic course and into the gallbladder lumen. Images were obtained and stored for the medical record. A 0.018 wire was advanced in the gallbladder lumen. The needle was exchanged for the Accustick transitional dilator which was advanced over the wire and into the gallbladder. Aspiration yields dark black bile. A sample was sent for Gram stain and culture. A 0.035 wire was coiled in the gallbladder lumen. The skin tract was dilated to 10 [place]. A Cook 10.2 [place] all-purpose drainage catheter was advanced over the wire and formed in the gallbladder. The catheter was connected to gravity bag drainage and secured to the skin with 0 Prolene suture. Overall, the patient tolerated the procedure well. IMPRESSION: Successful placement of 10 French percutaneous transhepatic cholecystostomy tube for the indication of acute calculus cholecystitis. Electronically Signed   By: Jacqulynn Cadet M.D.   On: 03/16/2021 14:07   DG CHEST PORT 1 VIEW  Result Date: 04/02/2021 CLINICAL DATA:  Shortness of breath.  Follow-up exam. EXAM: PORTABLE CHEST 1 VIEW COMPARISON:  03/24/2021 and older studies. FINDINGS: Patchy airspace opacity has increased at the right lung base, extending to the right mid lung. Mild linear atelectasis or scarring is noted at the medial left lung base, stable. Remainder of the lungs is clear. Cardiac silhouette normal in size. Stable endovascular aortic valve  replacement.  No pneumothorax. Stable pigtail catheter in the right upper quadrant. IMPRESSION: 1. Interval increase in airspace opacity at the right lung base from the previous day's exam, which may reflect reaccumulation of fluid and associated atelectasis, with infection also in the differential diagnosis. 2. No other change.  No pneumothorax. Electronically Signed   By: Lajean Manes M.D.   On: 04/02/2021 08:30   DG Chest Port 1 View  Result Date: 04/01/2021 CLINICAL DATA:  Status post thoracentesis.  Right pleural effusion. EXAM: PORTABLE CHEST 1 VIEW COMPARISON:  03/27/2021. FINDINGS: There is decreased opacity at the right lung base with most of the right hemidiaphragm now defined. Residual opacity at the right base is consistent with atelectasis, likely with a small residual pleural effusion. Remainder of the lungs is clear. No pneumothorax. A pigtail catheter projects in the right upper quadrant. Cardiac silhouette is normal in size. IMPRESSION: 1. Decreased right lung base opacity following thoracentesis. Residual opacity consistent with atelectasis and a small effusion. 2. Remainder of the lungs is clear. 3. No pneumothorax. Electronically Signed   By: Lajean Manes M.D.   On: 04/01/2021 13:37   DG CHEST PORT 1 VIEW  Result Date: 03/17/2021 CLINICAL DATA:  Chest abdominal pain.  Aspiration in airway. EXAM: PORTABLE CHEST 1 VIEW COMPARISON:  AP chest 03/16/2021 FINDINGS: The patient is mildly rightward rotated. Within this limitation, cardiac silhouette and mediastinal contours are grossly unchanged and within normal limits. Calcification is seen within the aortic arch. 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 The lungs appear clear. No pleural effusion or pneumothorax. No acute skeletal abnormality. IMPRESSION: No acute lung process. Electronically Signed   By: Yvonne Kendall M.D.   On: 03/17/2021 09:21   DG CHEST PORT 1 VIEW  Result Date: 03/16/2021 CLINICAL DATA:  Hypoxia EXAM: PORTABLE CHEST  1 VIEW COMPARISON:  Chest x-ray dated March 15, 2021 FINDINGS: Cardiac and mediastinal contours are unchanged within normal limits. Prior transcatheter aortic valve replacement. Interval resolution of right basilar lung opacity, favor resolved atelectasis. No focal consolidation. No large pleural effusion pneumothorax. IMPRESSION: Interval resolution of right basilar lung opacity, favor resolved atelectasis. Electronically Signed   By: Yetta Glassman M.D.   On: 03/16/2021 07:46   DG Abd Portable 1V  Result Date: 03/31/2021 CLINICAL DATA:  Emesis EXAM: PORTABLE ABDOMEN - 1 VIEW COMPARISON:  Abdominal radiograph dated March 29, 2021 FINDINGS: Nonobstructive bowel gas pattern. Surgical drain noted in the right upper quadrant. Partially visualized left lung bases clear. IMPRESSION: Nonobstructive bowel gas pattern. Electronically Signed   By: Yetta Glassman M.D.   On: 03/31/2021 10:49   DG Abd Portable 1V  Result Date: 03/29/2021 CLINICAL DATA:  Emesis for 2-3 days, history of cancer and diabetes in an 81 year old male. EXAM: PORTABLE ABDOMEN - 1 VIEW COMPARISON:  CT of March 23, 2021. FINDINGS: A percutaneous cholecystostomy tube is partially visualized projecting over the RIGHT lower quadrant. RIGHT and LEFT hemidiaphragmatic contours are excluded from view. Imaging excludes the upper abdomen on today's study. There is no sign of bowel obstruction. Small amount of stool and gas present in the rectum. Brachytherapy seeds are present in the prostate. On limited assessment there is no acute regional skeletal process. IMPRESSION: 1. No sign of bowel obstruction. Imaging excludes the upper abdomen on today's study. 2. Cholecystostomy tube partially visualized projecting over the RIGHT lower quadrant. Electronically Signed   By: Zetta Bills M.D.   On: 03/29/2021 14:47   CT Renal Stone Study  Result Date: 03/15/2021 CLINICAL DATA:  Right abdominal pain. EXAM: CT ABDOMEN AND PELVIS WITHOUT CONTRAST  TECHNIQUE: Multidetector CT imaging of the abdomen and pelvis was performed following the standard protocol without IV contrast. RADIATION DOSE REDUCTION: This exam was performed according to the departmental dose-optimization program which includes automated exposure control, adjustment of the mA and/or kV according to patient size and/or use of iterative reconstruction technique. COMPARISON:  CTA abdomen and pelvis 12/01/2019 FINDINGS: Lower chest: Trace right pleural fluid is new from prior. Unchanged mild bibasilar bronchiectasis. Mild dependent right lower lobe ground-glass likely subsegmental atelectasis. A partially visualized ascending aortic endograft. Dense coronary artery calcifications are seen. No pericardial effusion. Lack of intra-articular fluid limits evaluation of the abdominal and pelvic organ parenchyma. The following findings are made within this limitation. Hepatobiliary: Smooth liver contours. No gross liver lesion is seen. The gallbladder is again moderately distended with numerous layering small gallstones, similar to prior. No definite gallbladder wall thickening. Pancreas: Unremarkable. No pancreatic ductal dilatation or surrounding inflammatory changes. Spleen: Normal in size without focal abnormality. Adrenals/Urinary Tract: Left adrenal 2.6 cm nodule is unchanged from 11/26/2019 with internal density of 15 Hounsfield units. Right adrenal 15 mm nodule is unchanged in size with internal density of 5 Hounsfield units suggesting a benign lipid rich adenoma. These both again are likely benign. Mild left-greater-than-right renal pelvis vascular calcifications. Mild bilateral renal cortical thinning is again seen. Bilateral moderate extrarenal pelves are again noted. There is again mild-to-moderate dilatation of the bilateral ureters diffusely down to the level of the ureteropelvic junction likely chronic. No obstructing renal stone is seen. No hydronephrosis. Within the limitations of lack of  IV contrast, no contour deforming renal mass. The urinary bladder is grossly unremarkable. Stomach/Bowel: No bowel wall thickening. The terminal ileum is unremarkable. The appendix is within normal limits. No dilated loops of bowel to indicate bowel obstruction. Vascular/Lymphatic: No abdominal aortic aneurysm. Dense vascular calcifications. No enlarged abdominal or pelvic lymph nodes. 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 Other: Small fat containing umbilical hernia, unchanged. Unchanged small fat containing left inguinal hernia. No free air or free fluid. Musculoskeletal: Mild multilevel degenerative disc changes. Old healed lateral right eleventh rib fracture unchanged. IMPRESSION: 1. No nephroureterolithiasis or hydronephrosis. 2. Cholelithiasis. 3. 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 4. Small fat containing umbilical and left inguinal hernias. 5. Trace right pleural effusion with mild subsegmental atelectasis. 6.  Aortic Atherosclerosis (ICD10-I70.0). Electronically Signed   By: Yvonne Kendall   On: 03/15/2021 11:14   IR CHOLANGIOGRAM EXISTING TUBE  Result Date: 03/23/2021 INDICATION: Cholelithiasis, status post percutaneous cholecystostomy catheter placement 03/16/2021. Difficulty flushing , no output. EXAM: CHOLANGIOGRAM THROUGH EXISTING CATHETER MEDICATIONS: None indicated ANESTHESIA/SEDATION: None required FLUOROSCOPY TIME:  6 seconds; 1 mGy COMPLICATIONS: None immediate. PROCEDURE: Informed written consent was obtained from the patient after a thorough discussion of the procedural risks, benefits and alternatives. All questions were addressed. Inspection of the right upper quadrant shows significant retraction of the pigtail drain catheter since prior study. Injection shows the catheter is in the subcutaneous tissues. There is no filling of tract to the gallbladder, nor of the gallbladder itself. IMPRESSION: 1. The  drain catheter had pulled out completely from the gallbladder with no opacification of the tract or lumen. The catheter was removed. PLAN: Observe clinically. If symptoms recur, patient may need new percutaneous cholecystostomy catheter placement. Electronically Signed Electronically Signed   By: Lucrezia Europe M.D.   On: 03/23/2021 15:59   ECHOCARDIOGRAM LIMITED  Result Date: 03/17/2021  ECHOCARDIOGRAM LIMITED REPORT   Patient Name:   Elijah White Date of Exam: 03/17/2021 Medical Rec #:  [id number]       Height:       65.0 in Accession #:    [id number]      Weight:       143.3 lb Date of Birth:  04-02-1940       BSA:          1.717 m Patient Age:    44 years        BP:           116/50 mmHg Patient Gender: M               HR:           114 bpm. Exam Location:  Inpatient Procedure: Cardiac Doppler, Color Doppler and Limited Echo                         STAT ECHO Reported to: Dr Gwyndolyn Kaufman on 03/17/2021 5:50:00 PM. Indications:    Hypotension  History:        Patient has no prior history of Echocardiogram examinations,                 most recent 01/05/2021.                 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  Sonographer:    Clayton Lefort RDCS (AE) Referring Phys: [id number] Darreld Mclean  Sonographer Comments: Image acquisition challenging due to respiratory motion. IMPRESSIONS  1. Left ventricular ejection fraction, by estimation, is 60 to 65%. The left ventricle has normal function. There is moderate concentric left ventricular hypertrophy.  2. Right ventricular systolic function is normal. The right ventricular size is normal.  3. The mitral valve is degenerative. Severe mitral annular calcification.  4. 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 Echo findings are consistent with normal structure and function of the aortic valve prosthesis. Aortic  valve mean gradient measures 5.0 mmHg. Aortic valve Vmax measures 1.40 m/s. DI 0.8. There is no paravalvular leak.  5. The inferior vena cava is normal in size with <50% respiratory variability, suggesting right atrial pressure of 8 mmHg. Comparison(s): Compared to prior TTE on 01/2021, there is no significant change. TAVR valve continues to be well seated with current gradient 36mmHg (previously 55mmHg). FINDINGS  Left Ventricle: Left ventricular ejection fraction, by estimation, is 60 to 65%. The left ventricle has normal function. The left ventricular internal cavity size was normal in size. There is moderate concentric left ventricular hypertrophy. Right Ventricle: The right ventricular size is normal. Right ventricular systolic function is normal. Pericardium: There is no evidence of pericardial effusion. Mitral Valve: The mitral valve is degenerative in appearance. There is moderate thickening of the mitral valve leaflet(s). There is moderate calcification of the mitral valve leaflet(s). Severe mitral annular calcification. Tricuspid Valve: The tricuspid valve is normal in structure. Tricuspid valve regurgitation is trivial. Aortic Valve: DI 0.8. No PVL. The aortic valve has been repaired/replaced. Aortic valve mean gradient measures 5.0 mmHg. Aortic valve peak gradient measures 7.8 mmHg. Aortic valve area, by VTI measures 2.67 cm. 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 Echo findings are consistent with normal structure and function of the aortic valve prosthesis. Pulmonic Valve: The pulmonic valve was not well visualized. Pulmonic valve regurgitation is trivial. Aorta: The aortic root and ascending aorta are structurally normal, with no evidence of dilitation. Venous: The inferior vena cava is normal in size with less than 50% respiratory variability, suggesting right atrial pressure of 8 mmHg. LEFT VENTRICLE PLAX 2D LVIDd:         3.90 cm LVIDs:         3.00  cm LV PW:         1.30 cm LV IVS:        1.20 cm LVOT diam:     2.00 cm LV SV:         61 LV SV Index:   36 LVOT Area:     3.14 cm  LEFT ATRIUM           Index LA diam:      3.40 cm 1.98 cm/m LA Vol (A4C): 20.0 ml 11.65 ml/m  AORTIC VALVE AV Area (Vmax):    2.51 cm AV Area (Vmean):   2.53 cm AV Area (VTI):     2.67 cm AV Vmax:           140.00 cm/s AV Vmean:          104.000 cm/s AV VTI:            0.228 m AV Peak Grad:      7.8 mmHg AV Mean Grad:      5.0 mmHg LVOT Vmax:         112.00 cm/s LVOT Vmean:        83.900 cm/s LVOT VTI:          0.194 m LVOT/AV VTI ratio: 0.85  AORTA Ao Root diam: 2.50 cm Ao Asc diam:  2.60 cm  SHUNTS Systemic VTI:  0.19 m Systemic Diam: 2.00 cm Gwyndolyn Kaufman MD Electronically signed by Gwyndolyn Kaufman MD Signature Date/Time: 03/17/2021/6:28:04 PM    Final    [place] EKG SITE RITE  Result Date: 03/19/2021 If Site Rite image not attached, placement could not be confirmed due to current cardiac rhythm.  US Abdomen Limited RUQ (LIVER/GB)  Result Date: 03/27/2021 CLINICAL DATA:  Abdominal pain. EXAM: ULTRASOUND ABDOMEN LIMITED RIGHT UPPER QUADRANT COMPARISON:  CT abdomen and pelvis 03/23/2021 FINDINGS: Gallbladder: Distended gallbladder containing debris/sludge and tiny stones. Pericholecystic fluid. No generalized wall thickening. A positive sonographic Percell Miller sign was noted by the sonographer. Common bile duct: Diameter: 5 mm Liver: Suboptimally evaluated due to bowel gas. No gross mass identified. Portal vein is patent on color Doppler imaging with normal direction of blood flow towards the liver. Other: Right pleural effusion. IMPRESSION: Distended gallbladder containing debris/sludge and tiny stones with pericholecystic fluid and positive sonographic Murphy sign concerning for acute cholecystitis. Electronically Signed   By: Logan Bores M.D.   On: 03/27/2021 15:21   US THORACENTESIS ASP PLEURAL SPACE W/IMG GUIDE  Result Date: 04/01/2021 INDICATION: Patient with  history of end-stage renal disease, AFib, acute cholecystitis status post percutaneous cholecystostomy placement now with worsening bilateral pleural effusions. Request to IR for diagnostic and therapeutic right thoracentesis EXAM: ULTRASOUND GUIDED RIGHT THORACENTESIS MEDICATIONS: 7 mL 1% lidocaine COMPLICATIONS: None immediate. PROCEDURE: An ultrasound guided thoracentesis was thoroughly discussed with the patient and questions answered. The benefits, risks, alternatives and complications were also discussed. The patient understands and wishes to proceed with the procedure. Written consent was  obtained. Ultrasound was performed to localize and mark an adequate pocket of fluid in the RIGHT chest. The area was then prepped and draped in the normal sterile fashion. 1% Lidocaine was used for local anesthesia. Under ultrasound guidance a 6 Fr Safe-T-Centesis catheter was introduced. Thoracentesis was performed. The catheter was removed and a dressing applied. FINDINGS: A total of approximately 1.1 L of dark red fluid was removed. Samples were sent to the laboratory as requested by the clinical team. IMPRESSION: Successful ultrasound guided RIGHT thoracentesis yielding 1.1 L of pleural fluid. Read by Candiss Norse, PA-C Electronically Signed   By: Michaelle Birks M.D.   On: 04/01/2021 19:20    DISCHARGE EXAMINATION: Vitals:   04/05/21 1030 04/05/21 1045 04/05/21 1101 04/05/21 1141  BP: (!) 144/68 (!) 122/48 140/63 133/62  Pulse: 63 63 62 83  Resp: $Remo'17 12 20 19  'VGiAI$ Temp:   97.6 F (36.4 C) (!) 97.3 F (36.3 C)  TempSrc:   Oral Oral  SpO2:   99% 100%  Weight:   66.2 kg   Height:       General appearance: Awake alert.  In no distress Resp: Clear to auscultation bilaterally.  Normal effort Cardio: S1-S2 is normal regular.  No S3-S4.  No rubs murmurs or bruit GI: Abdomen is soft.  Nontender nondistended.  Bowel sounds are present normal.  No masses organomegaly.  Cholecystostomy drain tube is  noted.    DISPOSITION: SNF  Discharge Instructions     Call MD for:  difficulty breathing, headache or visual disturbances   Complete by: As directed    Call MD for:  extreme fatigue   Complete by: As directed    Call MD for:  persistant dizziness or light-headedness   Complete by: As directed    Call MD for:  persistant nausea and vomiting   Complete by: As directed    Call MD for:  severe uncontrolled pain   Complete by: As directed    Call MD for:  temperature >100.4   Complete by: As directed    Discharge instructions   Complete by: As directed    Please review instructions on the discharge summary.  You were cared for by a hospitalist during your hospital stay. If you have any questions about your discharge medications or the care you received while you were in the hospital after you are discharged, you can call the unit and asked to speak with the hospitalist on call if the hospitalist that took care of you is not available. Once you are discharged, your primary care physician will handle any further medical issues. Please note that NO REFILLS for any discharge medications will be authorized once you are discharged, as it is imperative that you return to your primary care physician (or establish a relationship with a primary care physician if you do not have one) for your aftercare needs so that they can reassess your need for medications and monitor your lab values. If you do not have a primary care physician, you can call [phone] for a physician referral.   Discharge wound care:   Complete by: As directed    Flush drain catheter daily.  Change dressing around the drain catheter daily.   Increase activity slowly   Complete by: As directed          Allergies as of 04/05/2021   No Known Allergies      Medication List     STOP taking these medications    cefdinir 300  MG capsule Commonly known as: OMNICEF   ondansetron 4 MG tablet Commonly known as: Zofran        TAKE these medications    acetaminophen 500 MG tablet Commonly known as: TYLENOL Take 1,000 mg by mouth every 6 (six) hours as needed for moderate pain or headache.   amoxicillin-clavulanate 500-125 MG tablet Commonly known as: AUGMENTIN Take 1 tablet (500 mg total) by mouth daily for 6 days.   atorvastatin 10 MG tablet Commonly known as: LIPITOR Take 1 tablet (10 mg total) by mouth in the morning.   Auryxia 1 GM 210 MG(Fe) tablet Generic drug: ferric citrate Take 420 mg by mouth 3 (three) times daily with meals.   blood glucose meter kit and supplies Kit Dispense based on patient and insurance preference. Use up to four times daily as directed. (FOR ICD-9 250.00, 250.01). For QAC - HS accuchecks.   brimonidine 0.15 % ophthalmic solution Commonly known as: ALPHAGAN Place 1 drop into the right eye 3 (three) times daily.   carbidopa-levodopa 25-100 MG tablet Commonly known as: Sinemet Take 1 tablet by mouth 3 (three) times daily. What changed: additional instructions   cinacalcet 30 MG tablet Commonly known as: SENSIPAR Take 2 tablets (60 mg total) by mouth every Monday, Wednesday, and Friday with hemodialysis.   clopidogrel 75 MG tablet Commonly known as: PLAVIX Take 1 tablet (75 mg total) by mouth daily with breakfast.   Dialyvite 800 0.8 MG Tabs Take 1 tablet by mouth in the morning.   docusate sodium 100 MG capsule Commonly known as: COLACE Take 100 mg by mouth 2 (two) times daily.   dorzolamide-timolol 22.3-6.8 MG/ML ophthalmic solution Commonly known as: COSOPT Place 1 drop into the right eye 2 (two) times daily.   fluconazole 100 MG tablet Commonly known as: Diflucan Take 1 tablet (100 mg total) by mouth daily for 4 days.   gabapentin 100 MG capsule Commonly known as: Neurontin Take 1 capsule (100 mg total) by mouth daily.   Goniosoft 2.5 % ophthalmic solution Generic drug: hydroxypropyl methylcellulose / hypromellose Place 1 drop into the left eye  2 (two) times daily as needed for dry eyes.   HECTOROL IV Doxercalciferol (Hectorol)   Melatonin 5 MG Caps Take 5 mg by mouth at bedtime.   metoprolol tartrate 25 MG tablet Commonly known as: LOPRESSOR Take 0.5 tablets (12.5 mg total) by mouth 2 (two) times daily. Hold if sbp less than 100   MIRCERA IJ Mircera   nystatin 100000 UNIT/ML suspension Commonly known as: MYCOSTATIN Take 5 mLs (500,000 Units total) by mouth 4 (four) times daily.   OneTouch Verio test strip Generic drug: glucose blood USE AS DIRECTED TO TEST FOUR TIMES A DAY   oxyCODONE 5 MG immediate release tablet Commonly known as: Oxy IR/ROXICODONE Take 1 tablet (5 mg total) by mouth every 6 (six) hours as needed for moderate pain.   pantoprazole 40 MG tablet Commonly known as: PROTONIX Take 1 tablet (40 mg total) by mouth daily at 12 noon.   pilocarpine 4 % ophthalmic solution Commonly known as: PILOCAR Place 1 drop into the right eye 4 (four) times daily.   polyethylene glycol 17 g packet Commonly known as: MIRALAX / GLYCOLAX Take 17 g by mouth daily.   RA SALINE ENEMA RE Place 1 enema rectally daily as needed (for constipation not relieved by bisacodyl).   Rocklatan 0.02-0.005 % Soln Generic drug: Netarsudil-Latanoprost Place 1 drop into the right eye at bedtime.   senna-docusate 8.6-50 MG  tablet Commonly known as: Senokot-S Take 1 tablet by mouth at bedtime.   sevelamer carbonate 800 MG tablet Commonly known as: RENVELA Take 2 tablets (1,600 mg total) by mouth 3 (three) times daily with meals.               Discharge Care Instructions  (From admission, onward)           Start     Ordered   04/05/21 0000  Discharge wound care:       Comments: Flush drain catheter daily.  Change dressing around the drain catheter daily.   04/05/21 1034              Contact information for follow-up providers     Coralie Keens, MD Follow up on 05/09/2021.   Specialty: General  Surgery Why: 940am. Please arrive 30 minutes prior to your appointment for paperwork. Please bring a copy of your photo ID and insurance card. Contact information: [address] [place] [address] [phone]         [facility] Follow up.   Why: Please follow up for drain evaluation/exchange in 6-8 weeks after hospital discharge. A scheduler from our office will call you with a date/time. Please flush the drain once daily with 5 ml NS. Document the daily output. Keep the site clean and dry. Please call our office with any questions prior to your visit. Contact information: [place] [address] [phone]              Contact information for after-discharge care     Destination     [facility] Preferred SNF .   Service: Skilled Nursing Contact information: [address] [place] [phone]                     TOTAL DISCHARGE TIME: 34 minutes  [place]  [facility] Pager on [email or web address]  04/05/2021, 11:47 AM

## 2021-04-05 NOTE — Procedures (Addendum)
Patient seen on Hemodialysis. BP 99/62    Pulse 64    Temp 97.6 F (36.4 C) (Oral)    Resp 14    Ht 5\' 5"  (1.651 m)    Wt 67.5 kg    SpO2 100%    BMI 24.76 kg/m   QB 400, UF goal 1.5L Tolerating treatment with complaints of itching at this time- just got benadryl.   Elmarie Shiley MD [facility]. Office # [phone] Pager # [phone] 10:11 AM

## 2021-04-05 NOTE — Plan of Care (Signed)

## 2021-04-06 LAB — CYTOLOGY - NON PAP

## 2021-04-07 ENCOUNTER — Inpatient Hospital Stay (HOSPITAL_COMMUNITY): Payer: Medicare Other

## 2021-04-07 ENCOUNTER — Inpatient Hospital Stay (HOSPITAL_COMMUNITY)
Admission: EM | Admit: 2021-04-07 | Discharge: 2021-04-13 | DRG: 871 | Disposition: A | Discharge status: Skilled Nursing Facility | Payer: Medicare Other | Attending: Internal Medicine | Admitting: Internal Medicine

## 2021-04-07 ENCOUNTER — Emergency Department (HOSPITAL_COMMUNITY): Payer: Medicare Other

## 2021-04-07 DIAGNOSIS — L299 Pruritus, unspecified: Secondary | ICD-10-CM | POA: Diagnosis present

## 2021-04-07 DIAGNOSIS — I959 Hypotension, unspecified: Secondary | ICD-10-CM | POA: Diagnosis present

## 2021-04-07 DIAGNOSIS — Z79899 Other long term (current) drug therapy: Secondary | ICD-10-CM | POA: Diagnosis not present

## 2021-04-07 DIAGNOSIS — L8990 Pressure ulcer of unspecified site, unspecified stage: Secondary | ICD-10-CM | POA: Diagnosis present

## 2021-04-07 DIAGNOSIS — E7800 Pure hypercholesterolemia, unspecified: Secondary | ICD-10-CM | POA: Diagnosis present

## 2021-04-07 DIAGNOSIS — I120 Hypertensive chronic kidney disease with stage 5 chronic kidney disease or end stage renal disease: Secondary | ICD-10-CM | POA: Diagnosis present

## 2021-04-07 DIAGNOSIS — J189 Pneumonia, unspecified organism: Secondary | ICD-10-CM | POA: Diagnosis present

## 2021-04-07 DIAGNOSIS — K8000 Calculus of gallbladder with acute cholecystitis without obstruction: Secondary | ICD-10-CM | POA: Diagnosis present

## 2021-04-07 DIAGNOSIS — I480 Paroxysmal atrial fibrillation: Secondary | ICD-10-CM | POA: Diagnosis present

## 2021-04-07 DIAGNOSIS — E278 Other specified disorders of adrenal gland: Secondary | ICD-10-CM | POA: Diagnosis present

## 2021-04-07 DIAGNOSIS — Z89612 Acquired absence of left leg above knee: Secondary | ICD-10-CM | POA: Diagnosis not present

## 2021-04-07 DIAGNOSIS — H409 Unspecified glaucoma: Secondary | ICD-10-CM | POA: Diagnosis present

## 2021-04-07 DIAGNOSIS — R338 Other retention of urine: Secondary | ICD-10-CM | POA: Diagnosis present

## 2021-04-07 DIAGNOSIS — Z8701 Personal history of pneumonia (recurrent): Secondary | ICD-10-CM

## 2021-04-07 DIAGNOSIS — B3781 Candidal esophagitis: Secondary | ICD-10-CM | POA: Diagnosis present

## 2021-04-07 DIAGNOSIS — I4510 Unspecified right bundle-branch block: Secondary | ICD-10-CM | POA: Diagnosis present

## 2021-04-07 DIAGNOSIS — L89152 Pressure ulcer of sacral region, stage 2: Secondary | ICD-10-CM | POA: Diagnosis present

## 2021-04-07 DIAGNOSIS — N400 Enlarged prostate without lower urinary tract symptoms: Secondary | ICD-10-CM | POA: Diagnosis present

## 2021-04-07 DIAGNOSIS — D631 Anemia in chronic kidney disease: Secondary | ICD-10-CM | POA: Diagnosis present

## 2021-04-07 DIAGNOSIS — E1122 Type 2 diabetes mellitus with diabetic chronic kidney disease: Secondary | ICD-10-CM | POA: Diagnosis present

## 2021-04-07 DIAGNOSIS — Z66 Do not resuscitate: Secondary | ICD-10-CM | POA: Diagnosis present

## 2021-04-07 DIAGNOSIS — Z992 Dependence on renal dialysis: Secondary | ICD-10-CM

## 2021-04-07 DIAGNOSIS — Z515 Encounter for palliative care: Secondary | ICD-10-CM

## 2021-04-07 DIAGNOSIS — K810 Acute cholecystitis: Secondary | ICD-10-CM | POA: Diagnosis not present

## 2021-04-07 DIAGNOSIS — G20 Parkinson's disease: Secondary | ICD-10-CM | POA: Diagnosis present

## 2021-04-07 DIAGNOSIS — I9589 Other hypotension: Secondary | ICD-10-CM | POA: Diagnosis present

## 2021-04-07 DIAGNOSIS — A419 Sepsis, unspecified organism: Principal | ICD-10-CM | POA: Diagnosis present

## 2021-04-07 DIAGNOSIS — E869 Volume depletion, unspecified: Secondary | ICD-10-CM | POA: Diagnosis present

## 2021-04-07 DIAGNOSIS — Z952 Presence of prosthetic heart valve: Secondary | ICD-10-CM | POA: Diagnosis not present

## 2021-04-07 DIAGNOSIS — Z8546 Personal history of malignant neoplasm of prostate: Secondary | ICD-10-CM

## 2021-04-07 DIAGNOSIS — R410 Disorientation, unspecified: Secondary | ICD-10-CM | POA: Diagnosis not present

## 2021-04-07 DIAGNOSIS — Z20822 Contact with and (suspected) exposure to covid-19: Secondary | ICD-10-CM | POA: Diagnosis present

## 2021-04-07 DIAGNOSIS — Z8249 Family history of ischemic heart disease and other diseases of the circulatory system: Secondary | ICD-10-CM

## 2021-04-07 DIAGNOSIS — Z7902 Long term (current) use of antithrombotics/antiplatelets: Secondary | ICD-10-CM

## 2021-04-07 DIAGNOSIS — E669 Obesity, unspecified: Secondary | ICD-10-CM | POA: Diagnosis present

## 2021-04-07 DIAGNOSIS — N2581 Secondary hyperparathyroidism of renal origin: Secondary | ICD-10-CM | POA: Diagnosis present

## 2021-04-07 DIAGNOSIS — E1129 Type 2 diabetes mellitus with other diabetic kidney complication: Secondary | ICD-10-CM | POA: Diagnosis present

## 2021-04-07 DIAGNOSIS — R079 Chest pain, unspecified: Secondary | ICD-10-CM

## 2021-04-07 DIAGNOSIS — N186 End stage renal disease: Secondary | ICD-10-CM | POA: Diagnosis present

## 2021-04-07 DIAGNOSIS — E1151 Type 2 diabetes mellitus with diabetic peripheral angiopathy without gangrene: Secondary | ICD-10-CM | POA: Diagnosis present

## 2021-04-07 DIAGNOSIS — Z6824 Body mass index (BMI) 24.0-24.9, adult: Secondary | ICD-10-CM

## 2021-04-07 DIAGNOSIS — Z87891 Personal history of nicotine dependence: Secondary | ICD-10-CM

## 2021-04-07 DIAGNOSIS — J90 Pleural effusion, not elsewhere classified: Secondary | ICD-10-CM | POA: Diagnosis present

## 2021-04-07 DIAGNOSIS — H548 Legal blindness, as defined in USA: Secondary | ICD-10-CM | POA: Diagnosis present

## 2021-04-07 DIAGNOSIS — I739 Peripheral vascular disease, unspecified: Secondary | ICD-10-CM | POA: Diagnosis present

## 2021-04-07 DIAGNOSIS — K219 Gastro-esophageal reflux disease without esophagitis: Secondary | ICD-10-CM | POA: Diagnosis present

## 2021-04-07 DIAGNOSIS — Z89512 Acquired absence of left leg below knee: Secondary | ICD-10-CM | POA: Diagnosis not present

## 2021-04-07 DIAGNOSIS — C61 Malignant neoplasm of prostate: Secondary | ICD-10-CM | POA: Diagnosis present

## 2021-04-07 DIAGNOSIS — I2510 Atherosclerotic heart disease of native coronary artery without angina pectoris: Secondary | ICD-10-CM | POA: Diagnosis present

## 2021-04-07 DIAGNOSIS — R54 Age-related physical debility: Secondary | ICD-10-CM | POA: Diagnosis present

## 2021-04-07 DIAGNOSIS — J690 Pneumonitis due to inhalation of food and vomit: Secondary | ICD-10-CM | POA: Diagnosis present

## 2021-04-07 DIAGNOSIS — I350 Nonrheumatic aortic (valve) stenosis: Secondary | ICD-10-CM

## 2021-04-07 DIAGNOSIS — H547 Unspecified visual loss: Secondary | ICD-10-CM

## 2021-04-07 LAB — CBC
HCT: 27.3 % — ABNORMAL LOW (ref 39.0–52.0)
Hemoglobin: 9.2 g/dL — ABNORMAL LOW (ref 13.0–17.0)
MCH: 35 pg — ABNORMAL HIGH (ref 26.0–34.0)
MCHC: 33.7 g/dL (ref 30.0–36.0)
MCV: 103.8 fL — ABNORMAL HIGH (ref 80.0–100.0)
Platelets: 411 10*3/uL — ABNORMAL HIGH (ref 150–400)
RBC: 2.63 MIL/uL — ABNORMAL LOW (ref 4.22–5.81)
RDW: 15.6 % — ABNORMAL HIGH (ref 11.5–15.5)
WBC: 16.2 10*3/uL — ABNORMAL HIGH (ref 4.0–10.5)
nRBC: 0 % (ref 0.0–0.2)

## 2021-04-07 LAB — CULTURE, BLOOD (ROUTINE X 2)
Culture: NO GROWTH
Culture: NO GROWTH

## 2021-04-07 LAB — HEMOGLOBIN A1C
Hgb A1c MFr Bld: 5.3 % (ref 4.8–5.6)
Mean Plasma Glucose: 105.41 mg/dL

## 2021-04-07 LAB — BASIC METABOLIC PANEL
Anion gap: 12 (ref 5–15)
BUN: 28 mg/dL — ABNORMAL HIGH (ref 8–23)
CO2: 20 mmol/L — ABNORMAL LOW (ref 22–32)
Calcium: 8.7 mg/dL — ABNORMAL LOW (ref 8.9–10.3)
Chloride: 100 mmol/L (ref 98–111)
Creatinine, Ser: 6 mg/dL — ABNORMAL HIGH (ref 0.61–1.24)
GFR, Estimated: 9 mL/min — ABNORMAL LOW (ref >60)
Glucose, Bld: 176 mg/dL — ABNORMAL HIGH (ref 70–99)
Potassium: 3.7 mmol/L (ref 3.5–5.1)
Sodium: 132 mmol/L — ABNORMAL LOW (ref 135–145)

## 2021-04-07 LAB — PROTIME-INR
INR: 0.9 (ref 0.8–1.2)
Prothrombin Time: 12.4 seconds (ref 11.4–15.2)

## 2021-04-07 LAB — TROPONIN I (HIGH SENSITIVITY)
Troponin I (High Sensitivity): 22 ng/L — ABNORMAL HIGH (ref <18)
Troponin I (High Sensitivity): 23 ng/L — ABNORMAL HIGH (ref <18)

## 2021-04-07 LAB — RESP PANEL BY RT-PCR (FLU A&B, COVID) ARPGX2
Influenza A by PCR: NEGATIVE
Influenza B by PCR: NEGATIVE
SARS Coronavirus 2 by RT PCR: NEGATIVE

## 2021-04-07 LAB — MRSA NEXT GEN BY PCR, NASAL: MRSA by PCR Next Gen: NOT DETECTED

## 2021-04-07 LAB — PROCALCITONIN: Procalcitonin: 1.89 ng/mL

## 2021-04-07 LAB — LACTIC ACID, PLASMA
Lactic Acid, Venous: 1.1 mmol/L (ref 0.5–1.9)
Lactic Acid, Venous: 1.8 mmol/L (ref 0.5–1.9)

## 2021-04-07 LAB — GLUCOSE, CAPILLARY: Glucose-Capillary: 94 mg/dL (ref 70–99)

## 2021-04-07 MED ORDER — HEPARIN SODIUM (PORCINE) 5000 UNIT/ML IJ SOLN
5000.0000 [IU] | Freq: Three times a day (TID) | INTRAMUSCULAR | Status: DC
  Administered 2021-04-07 – 2021-04-13 (×15): 5000 [IU] via SUBCUTANEOUS
  Filled 2021-04-07 (×17): qty 1

## 2021-04-07 MED ORDER — ONDANSETRON HCL 4 MG/2ML IJ SOLN
4.0000 mg | Freq: Four times a day (QID) | INTRAMUSCULAR | Status: DC | PRN
  Administered 2021-04-09: 4 mg via INTRAVENOUS
  Filled 2021-04-07: qty 2

## 2021-04-07 MED ORDER — VANCOMYCIN HCL 750 MG/150ML IV SOLN
750.0000 mg | INTRAVENOUS | Status: DC
  Filled 2021-04-07: qty 150

## 2021-04-07 MED ORDER — ONDANSETRON HCL 4 MG PO TABS: 4.0000 mg | ORAL_TABLET | Freq: Four times a day (QID) | ORAL | Status: DC | PRN

## 2021-04-07 MED ORDER — SODIUM CHLORIDE 0.9% FLUSH: 3.0000 mL | INTRAVENOUS | Status: DC | PRN

## 2021-04-07 MED ORDER — MIDODRINE HCL 5 MG PO TABS
5.0000 mg | ORAL_TABLET | Freq: Three times a day (TID) | ORAL | Status: DC
  Administered 2021-04-08: 5 mg via ORAL
  Filled 2021-04-07: qty 1

## 2021-04-07 MED ORDER — SODIUM CHLORIDE 0.9 % IV SOLN
1.0000 g | INTRAVENOUS | Status: DC
  Administered 2021-04-08 – 2021-04-09 (×2): 1 g via INTRAVENOUS
  Filled 2021-04-07 (×3): qty 1

## 2021-04-07 MED ORDER — SODIUM CHLORIDE 0.9% FLUSH
3.0000 mL | Freq: Two times a day (BID) | INTRAVENOUS | Status: DC
  Administered 2021-04-07 – 2021-04-13 (×8): 3 mL via INTRAVENOUS

## 2021-04-07 MED ORDER — SODIUM CHLORIDE 0.9 % IV BOLUS
500.0000 mL | Freq: Once | INTRAVENOUS | Status: AC
  Administered 2021-04-07: 500 mL via INTRAVENOUS

## 2021-04-07 MED ORDER — CHLORHEXIDINE GLUCONATE CLOTH 2 % EX PADS
6.0000 | MEDICATED_PAD | Freq: Every day | CUTANEOUS | Status: DC
  Administered 2021-04-08 – 2021-04-13 (×5): 6 via TOPICAL

## 2021-04-07 MED ORDER — SODIUM CHLORIDE 0.9 % IV SOLN: 250.0000 mL | INTRAVENOUS | Status: DC | PRN

## 2021-04-07 MED ORDER — SODIUM CHLORIDE 0.9 % IV BOLUS
250.0000 mL | Freq: Once | INTRAVENOUS | Status: AC
Start: 2021-04-07 — End: 2021-04-07
  Administered 2021-04-07: 250 mL via INTRAVENOUS

## 2021-04-07 MED ORDER — ACETAMINOPHEN 650 MG RE SUPP: 650.0000 mg | Freq: Four times a day (QID) | RECTAL | Status: DC | PRN

## 2021-04-07 MED ORDER — ACETAMINOPHEN 325 MG PO TABS
650.0000 mg | ORAL_TABLET | Freq: Four times a day (QID) | ORAL | Status: DC | PRN
  Administered 2021-04-08 – 2021-04-11 (×2): 650 mg via ORAL
  Filled 2021-04-07 (×2): qty 2

## 2021-04-07 MED ORDER — VANCOMYCIN HCL 1500 MG/300ML IV SOLN
1500.0000 mg | Freq: Once | INTRAVENOUS | Status: AC
  Administered 2021-04-07: 1500 mg via INTRAVENOUS
  Filled 2021-04-07: qty 300

## 2021-04-07 MED ORDER — INSULIN ASPART 100 UNIT/ML IJ SOLN: 0.0000 [IU] | Freq: Three times a day (TID) | INTRAMUSCULAR | Status: DC

## 2021-04-07 MED ORDER — METRONIDAZOLE 500 MG/100ML IV SOLN
500.0000 mg | Freq: Two times a day (BID) | INTRAVENOUS | Status: DC
  Administered 2021-04-07 – 2021-04-09 (×5): 500 mg via INTRAVENOUS
  Filled 2021-04-07 (×5): qty 100

## 2021-04-07 MED ORDER — IOHEXOL 350 MG/ML SOLN
100.0000 mL | Freq: Once | INTRAVENOUS | Status: AC | PRN
  Administered 2021-04-07: 100 mL via INTRAVENOUS

## 2021-04-07 MED ORDER — SODIUM CHLORIDE 0.9 % IV SOLN
2.0000 g | Freq: Once | INTRAVENOUS | Status: AC
  Administered 2021-04-07: 2 g via INTRAVENOUS
  Filled 2021-04-07: qty 2

## 2021-04-07 NOTE — Assessment & Plan Note (Addendum)
Appreciate general surgical input-cholecystostomy drain in place-completed antimicrobial therapy on 2/7.  Abdominal exam remains benign.  Please follow-up with general surgery in interventional radiology.

## 2021-04-07 NOTE — Assessment & Plan Note (Addendum)
CBGs as below-no longer on SSI.  Encourage oral intake-allow permissive hyperglycemia-not a candidate for tight glycemic control.    Recent Labs    04/12/21 1702 04/12/21 2050 04/13/21 0748  GLUCAP 127* 95 76

## 2021-04-07 NOTE — Assessment & Plan Note (Addendum)
At hospitalization on 3/07 he had complicated UTI, urinary retention and hydronephrosis requiring foley placement.  Urology follow-up in the outpatient setting was recommended-he has not yet seen yet.  At family's request-Foley catheter was discontinued on 2/5-Per nursing staff-he is oliguric at baseline and is voiding very minimally.  Continue to watch closely-bladder scan on 2/7 did not show any acute retention

## 2021-04-07 NOTE — ED Notes (Signed)
Patient's daughter called patient's phone while RN was in room. Patient requested RN update his daughter on his status. RN updated daughter and daughter will call back later for updates.

## 2021-04-07 NOTE — Assessment & Plan Note (Addendum)
Continue with eye drops.  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

## 2021-04-07 NOTE — ED Notes (Signed)
Patient transported to CT 

## 2021-04-07 NOTE — Assessment & Plan Note (Addendum)
Suspect sympathetic/PNA from neighboring cholecystitis-already treated during prior hospitalization-see above regarding antibiotics.

## 2021-04-07 NOTE — Assessment & Plan Note (Addendum)
Followed by neurology in the outpatient setting-continue Sinemet

## 2021-04-07 NOTE — Assessment & Plan Note (Addendum)
Pressure Injury 03/28/21 Coccyx Medial Stage 2 -  Partial thickness loss of dermis presenting as a shallow open injury with a red, pink wound bed without slough. wound bed is pink, no drainage noted (Active)  03/28/21 1028  Location: Coccyx  Location Orientation: Medial  Staging: Stage 2 -  Partial thickness loss of dermis presenting as a shallow open injury with a red, pink wound bed without slough.  Wound Description (Comments): wound bed is pink, no drainage noted  Present on Admission: Yes

## 2021-04-07 NOTE — Assessment & Plan Note (Addendum)
Maintaining sinus rhythm-not on anticoagulation due to severity of anemia/concern for GI bleeding and advanced age.  EGD last month was stable.  Resume metoprolol when BP permits

## 2021-04-07 NOTE — H&P (Signed)
NAME:  Elijah White, MRN:  [id number], DOB:  12-06-1940, LOS: 0 ADMISSION DATE:  04/07/2021, CONSULTATION DATE:  2/3 REFERRING MD: Raul Del MD , CHIEF COMPLAINT:  hypotension    History of Present Illness:  This is an 81 year old male w/ extensive medical hx as outlined below. He Just left the hospital 2/1 after being admitted for acute Cholecystitis requiring perc drainage (this was actually first treated 1/11-1/20 but perc drain got dislodged requiring replacement of drain).  At time of discharge he had no further abdominal pain, he was eating and tolerating his diet, his white blood cell count had improved, and was afebrile.  He was to go to dialysis on 2/3.  On arrival he complained of right sided chest discomfort, primarily right upper quadrant pain, and some vague left chest discomfort.  His systolic blood pressure was recorded at 50 at the dialysis center and therefore he was referred to the emergency room for further evaluation.On arrival he was awake, oriented, heart rate in the 60s, reported no current discomfort did report fatigue.  Initial systolic blood pressure in the 60s to 70s, was given 250 mL bolus of crystalloid with improvement in his blood pressure.  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  Right bundle branch block, peripheral artery disease,  Significant Hospital Events: Including procedures, antibiotic start  and stop dates in addition to other pertinent events   2/3 readmitted to the hospital after being discharged on 2/1. qSOFA score 1.  Culture sent, antibiotics initiated, blood pressure responding to crystalloid resuscitation.  Seen by critical care felt could go to stepdown with volume resuscitation, and further CT imaging of abdomen to ensure proper functioning percutaneous drain  Interim History / Subjective:  No acute complaints mild discomfort to right upper quadrant  Objective   Blood pressure (Abnormal) 83/53, pulse 67, temperature (Abnormal) 97.3 F (36.3 C), temperature source Oral, resp. rate 16, SpO2 99 %.       No intake or output data in the 24 hours ending 04/07/21 1820 There were no vitals filed for this visit.  Examination: General: Pleasant 81 year old male lying in bed HENT: Mucous membranes are moist no neck vein distention Lungs: Clear diminished right base no accessory use currently on room air Cardiovascular: Regular rhythm, systolic murmur audible.  Has good bruit and thrill noted on left upper extremity AV fistula Abdomen: Bowel sounds are present, mild tenderness over right upper quadrant, percutaneous drain with bilious output Extremities: Warm dry Neuro: Awake oriented GU: Has chronic Foley catheter  Resolved Hospital Problem list   Hypotension  Assessment & Plan:  Principal Problem:   Sepsis ([place]) Active Problems:   Hypotension   Type 2 diabetes mellitus with renal manifestations (HCC)   BPH (benign prostatic hyperplasia)   GERD (gastroesophageal reflux disease)   ESRD (end stage renal disease) (HCC)   Severe aortic stenosis   Blindness   S/P TAVR (transcatheter aortic valve replacement)   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HCC)   Anemia in chronic kidney disease  Malignant neoplasm of prostate (HCC)   Pruritus, unspecified   Acute cholecystitis   DNR (do not resuscitate)   Pressure injury of skin   Parkinson's disease ([place])   History  of pneumonia  Hypotension.  Differential diagnosis volume depletion, also could consider sepsis however qSOFA score only 1, was on antibiotics already, seems to be responding to fluid resuscitation efforts Plan Repeat 500 mL fluid bolus Check lactic acid Add low-dose midodrine 5 mg 3 times daily Consider norepinephrine infusion for persistent systolic blood pressure less than 90 Empiric antibiotics reasonable at this point Follow-up pending cultures Obtaining CT imaging (see below)  History of acute cholecystitis status post percutaneous cholecystectomy tube 1/24.  Most recent CT imaging did not suggest any intra-abdominal acute findings on 1/27 Plan Repeating CT abdomen pelvis  Recent pneumonia with parapneumonic right-sided effusion Chest x-ray actually looks improved compared to prior film Plan Will evaluate CT abdomen, should get adequate evaluation of effusion from this, specifically following up on known pleural effusion from last admission  End-stage renal disease.  Due for dialysis today, he is a Monday/Wednesday Friday dialysis patient 3 weeks.  Current evaluation of electrolytes do not suggest urgent need for dialysis today Plan Nephrology consultation Follow-up chemistries  History of PAF.  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  No evidence of bleeding. Plan Trend CBC  History of BPH with chronic Foley Plan Keep Foley in place  History of stage II pressure injury on the coccyx Plan Wound care  DNR status   Best Practice (right click and "Reselect all SmartList Selections" daily)   Diet/type: Regular consistency (see orders) DVT prophylaxis: prophylactic heparin  GI prophylaxis: PPI Lines: N/A Foley:  Yes, and it is  still needed Code Status:  DNR Last date of multidisciplinary goals of care discussion [per primary ]  Labs   CBC: Recent Labs  Lab 04/01/21 0154 04/02/21 0041 04/03/21 0249 04/04/21 0334 04/05/21 0723 04/07/21 1251  WBC 19.7* 23.7* 18.7* 15.5* 13.2* 16.2*  NEUTROABS 16.9* 20.7* 15.9* 13.5*  --   --   HGB 9.2* 8.3* 8.4* 8.4* 8.6* 9.2*  HCT 26.4* 24.8* 25.3* 25.8* 25.0* 27.3*  MCV 100.0 102.1* 102.8* 102.8* 101.6* 103.8*  PLT 501* 466* 442* 489* 474* 411*    Basic Metabolic Panel: Recent Labs  Lab 04/01/21 1148 04/02/21 0041 04/03/21 0249 04/04/21 0334 04/05/21 0723 04/07/21 1251  NA 134* 134* 135 137 136 132*  K 3.9 3.6 3.4* 3.9 3.6 3.7  CL 97* 97* 97* 101 103 100  CO2 23 22 20* 23 22 20*  GLUCOSE 108* 106* 87 115* 107* 176*  BUN 23 25* 33* 21 27* 28*  CREATININE 5.38* 5.96* 7.25* 5.24* 6.37* 6.00*  CALCIUM 9.0 8.8* 8.9 8.6* 8.8* 8.7*  PHOS 4.9*  --   --   --  4.3  --    GFR: Estimated Creatinine Clearance: 8.3 mL/min (A) (by C-G formula based on SCr of 6 mg/dL (H)). Recent Labs  Lab 04/03/21 0249 04/04/21 0334 04/05/21 0723 04/07/21 1251  WBC 18.7* 15.5* 13.2* 16.2*    Liver Function Tests: Recent Labs  Lab 04/01/21 1148 04/02/21 0041 04/05/21 0723  AST  --  16  --   ALT  --  6  --   ALKPHOS  --  212*  --  BILITOT  --  0.9  --   PROT  --  5.6*  --   ALBUMIN 2.0* 1.8* 1.6*   No results for input(s): LIPASE, AMYLASE in the last 168 hours. No results for input(s): AMMONIA in the last 168 hours.  ABG    Component Value Date/Time   PHART 7.377 01/14/2020 0906   PCO2ART 42.4 01/14/2020 0906   PO2ART 102 01/14/2020 0906   HCO3 24.3 01/14/2020 0906   TCO2 33 (H) 08/12/2020 0930   ACIDBASEDEF 0.2 01/14/2020 0906   O2SAT 97.1 01/14/2020 0906     Coagulation Profile: Recent Labs  Lab 04/07/21 1251  INR 0.9    Cardiac Enzymes: No results for input(s): CKTOTAL, CKMB, CKMBINDEX, TROPONINI in the last 168 hours.  HbA1C: Hemoglobin A1C   Date/Time Value Ref Range Status  09/11/2019 12:00 AM 6.7  Final   Hgb A1c MFr Bld  Date/Time Value Ref Range Status  01/14/2020 08:44 AM 7.1 (H) 4.8 - 5.6 % Final    Comment:    (NOTE) Pre diabetes:          5.7%-6.4%  Diabetes:              >6.4%  Glycemic control for   <7.0% adults with diabetes   11/26/2019 06:16 PM 7.5 (H) 4.8 - 5.6 % Final    Comment:    (NOTE) Pre diabetes:          5.7%-6.4%  Diabetes:              >6.4%  Glycemic control for   <7.0% adults with diabetes     CBG: Recent Labs  Lab 04/01/21 0821 04/01/21 1200 04/01/21 1552 04/01/21 2048 04/03/21 1707  GLUCAP 96 101* 104* 109* 96    Review of Systems:   Review of Systems  Constitutional:  Positive for malaise/fatigue. Negative for fever.  HENT: Negative.    Eyes: Negative.   Cardiovascular:  Positive for chest pain.  Gastrointestinal:  Positive for abdominal pain.  Genitourinary: Negative.   Musculoskeletal: Negative.   Skin: Negative.   Neurological: Negative.   Endo/Heme/Allergies: Negative.   Psychiatric/Behavioral: Negative.      Past Medical History:  He,  has a past medical history of Anemia, Aortic stenosis, Arthritis, Cancer ([place]), Colon polyps (~ 1993 and 2003), Diabetes mellitus without complication ([place]), Elevated cholesterol with high triglycerides, ESRD (end stage renal disease) ([place]), GERD (gastroesophageal reflux disease), Glaucoma, Hypertension, Legally blind in left eye, as defined in [place], PAD (peripheral artery disease) ([place]), and RBBB.   Surgical History:   Past Surgical History:  Procedure Laterality Date   ABDOMINAL AORTOGRAM W/LOWER EXTREMITY N/A 07/25/2020   Procedure: ABDOMINAL AORTOGRAM W/LOWER EXTREMITY;  Surgeon: Marty Heck, MD;  Location: [facility];  Service: Cardiovascular;  Laterality: N/A;   AMPUTATION Left 08/12/2020   Procedure: LEFT BELOW KNEE AMPUTATION;  Surgeon: Marty Heck, MD;  Location: [facility];  Service: Vascular;   Laterality: Left;   AV FISTULA PLACEMENT Left 03/20/2018   Procedure: ARTERIOVENOUS (AV) FISTULA CREATION ARM;  Surgeon: [name], MD;  Location: [facility];  Service: Vascular;  Laterality: Left;   [place] Left 05/21/2018   Procedure: LEFT BASILIC VEIN FISTULA SECOND STAGE;  Surgeon: [name], MD;  Location: [facility];  Service: Vascular;  Laterality: Left;   BIOPSY  03/25/2021   Procedure: BIOPSY;  Surgeon: Otis Brace, MD;  [facility];  Service: Gastroenterology;;   COLONOSCOPY     COLONOSCOPY  WITH PROPOFOL N/A 11/30/2019   Procedure: COLONOSCOPY WITH PROPOFOL;  Surgeon: Ronnette Juniper, MD;  Location: [facility];  Service: Gastroenterology;  Laterality: N/A;   ESOPHAGOGASTRODUODENOSCOPY (EGD) WITH PROPOFOL N/A 03/25/2021   Procedure: ESOPHAGOGASTRODUODENOSCOPY (EGD) WITH PROPOFOL;  Surgeon: Otis Brace, MD;  Location: [facility];  Service: Gastroenterology;  Laterality: N/A;   GLAUCOMA SURGERY  2019   multiple surgeries   HEMOSTASIS CLIP PLACEMENT  11/30/2019   Procedure: HEMOSTASIS CLIP PLACEMENT;  Surgeon: Ronnette Juniper, MD;  Location: [facility];  Service: Gastroenterology;;   IR CHOLANGIOGRAM EXISTING TUBE  03/21/2021   IR PERC CHOLECYSTOSTOMY  03/16/2021   IR PERC CHOLECYSTOSTOMY  03/28/2021   IR REMOVAL BILIARY DRAIN  03/21/2021   KNEE ARTHROSCOPY     MULTIPLE EXTRACTIONS WITH ALVEOLOPLASTY N/A 12/30/2019   Procedure: MULTIPLE EXTRACTION WITH ALVEOLOPLASTY;  Surgeon: Charlaine Dalton, DMD;  Location: [facility];  Service: Dentistry;  Laterality: N/A;   PERIPHERAL VASCULAR INTERVENTION Left 07/25/2020   Procedure: PERIPHERAL VASCULAR INTERVENTION;  Surgeon: Marty Heck, MD;  Location: [facility];  Service: Cardiovascular;  Laterality: Left;  superficial femoral   POLYPECTOMY  11/30/2019   Procedure: POLYPECTOMY;  Surgeon: Ronnette Juniper, MD;  Location: [facility];  Service: Gastroenterology;;   PROSTATE SURGERY      RIGHT/LEFT HEART CATH AND CORONARY ANGIOGRAPHY N/A 12/03/2019   Procedure: RIGHT/LEFT HEART CATH AND CORONARY ANGIOGRAPHY;  Surgeon: Burnell Blanks, MD;  Location: [facility];  Service: Cardiovascular;  Laterality: N/A;   SUBMUCOSAL TATTOO INJECTION  11/30/2019   Procedure: SUBMUCOSAL TATTOO INJECTION;  Surgeon: Ronnette Juniper, MD;  Location: [facility];  Service: Gastroenterology;;   TEE WITHOUT CARDIOVERSION N/A 01/19/2020   Procedure: TRANSESOPHAGEAL ECHOCARDIOGRAM (TEE);  Surgeon: Burnell Blanks, MD;  Location: [facility];  Service: Open Heart Surgery;  Laterality: N/A;   TRANSCATHETER AORTIC VALVE REPLACEMENT, TRANSFEMORAL Left 01/19/2020   Procedure: TRANSCATHETER AORTIC VALVE REPLACEMENT, LEFT TRANSFEMORAL;  Surgeon: Burnell Blanks, MD;  Location: [facility];  Service: Open Heart Surgery;  Laterality: Left;     Social History:   reports that he quit smoking about 3 years ago. His smoking use included cigarettes. He smoked an average of .5 packs per day. He has never used smokeless tobacco. He reports that he does not drink alcohol and does not use drugs.   Family History:  His family history includes CAD in his mother.   Allergies No Known Allergies   Home Medications  Prior to Admission medications   Medication Sig Start Date End Date Taking? Authorizing Provider  acetaminophen (TYLENOL) 500 MG tablet Take 1,000 mg by mouth every 6 (six) hours as needed for moderate pain or headache.   Yes [provider]  amoxicillin-clavulanate (AUGMENTIN) 500-125 MG tablet Take 1 tablet (500 mg total) by mouth daily for 6 days. 04/05/21 04/11/21 Yes Bonnielee Haff, MD  atorvastatin (LIPITOR) 10 MG tablet Take 1 tablet (10 mg total) by mouth in the morning. 08/16/20  Yes Rhyne, Samantha J, PA-C  B Complex-C-Folic Acid (DIALYVITE 784) 0.8 MG TABS Take 1 tablet by mouth in the morning. 05/08/19  Yes [provider]  brimonidine  (ALPHAGAN) 0.15 % ophthalmic solution Place 1 drop into the right eye 3 (three) times daily.   Yes [provider]  carbidopa-levodopa (SINEMET) 25-100 MG tablet Take 1 tablet by mouth 3 (three) times daily. Patient taking differently: Take 1 tablet by mouth 3 (three) times daily. 10am, 4pm, 10pm 10/19/20 05/13/21 Yes Curatolo, Quita Skye,  DO  cinacalcet (SENSIPAR) 30 MG tablet Take 2 tablets (60 mg total) by mouth every Monday, Wednesday, and Friday with hemodialysis. 04/05/21  Yes Bonnielee Haff, MD  clopidogrel (PLAVIX) 75 MG tablet Take 1 tablet (75 mg total) by mouth daily with breakfast. 07/25/20  Yes Marty Heck, MD  docusate sodium (COLACE) 100 MG capsule Take 100 mg by mouth 2 (two) times daily.   Yes [provider]  dorzolamide-timolol (COSOPT) 22.3-6.8 MG/ML ophthalmic solution Place 1 drop into the right eye 2 (two) times daily. 06/25/17  Yes [provider]  ferric citrate (AURYXIA) 1 GM 210 MG(Fe) tablet Take 420 mg by mouth 3 (three) times daily with meals.   Yes [provider]  fluconazole (DIFLUCAN) 100 MG tablet Take 1 tablet (100 mg total) by mouth daily for 4 days. 04/05/21 04/09/21 Yes Bonnielee Haff, MD  gabapentin (NEURONTIN) 100 MG capsule Take 1 capsule (100 mg total) by mouth daily. 10/19/20 05/13/21 Yes Curatolo, Adam, DO  hydrOXYzine (ATARAX) 25 MG tablet Take 25 mg by mouth every 8 (eight) hours as needed for itching. 04/07/21  Yes [provider]  hypromellose (GENTEAL SEVERE) 0.3 % GEL ophthalmic ointment Place 1 application into the left eye every 12 (twelve) hours as needed for dry eyes.   Yes [provider]  melatonin 5 MG TABS Take 5 mg by mouth at bedtime.   Yes [provider]  metoprolol tartrate (LOPRESSOR) 25 MG tablet Take 0.5 tablets (12.5 mg total) by mouth 2 (two) times daily. Hold if sbp less than 100 03/26/21 03/26/22 Yes Florencia Reasons, MD  Netarsudil-Latanoprost (ROCKLATAN) 0.02-0.005 % SOLN Place 1 drop  into the right eye at bedtime.   Yes [provider]  nystatin (MYCOSTATIN) 100000 UNIT/ML suspension Take 5 mLs (500,000 Units total) by mouth 4 (four) times daily. 03/26/21  Yes Florencia Reasons, MD  oxyCODONE (OXY IR/ROXICODONE) 5 MG immediate release tablet Take 1 tablet (5 mg total) by mouth every 6 (six) hours as needed for moderate pain. 04/05/21  Yes Bonnielee Haff, MD  pantoprazole (PROTONIX) 40 MG tablet Take 1 tablet (40 mg total) by mouth daily at 12 noon. 08/16/20  Yes Rhyne, Hulen Shouts, PA-C  pilocarpine (PILOCAR) 4 % ophthalmic solution Place 1 drop into the right eye 4 (four) times daily.    Yes [provider]  polyethylene glycol (MIRALAX / GLYCOLAX) 17 g packet Take 17 g by mouth daily. Patient taking differently: Take 17 g by mouth every morning. Mix in 4 oz water and drink 03/26/21  Yes Florencia Reasons, MD  senna-docusate (SENOKOT-S) 8.6-50 MG tablet Take 1 tablet by mouth at bedtime. 03/26/21  Yes Florencia Reasons, MD  sevelamer carbonate (RENVELA) 800 MG tablet Take 2 tablets (1,600 mg total) by mouth 3 (three) times daily with meals. 03/26/21  Yes Florencia Reasons, MD  blood glucose meter kit and supplies KIT Dispense based on patient and insurance preference. Use up to four times daily as directed. (FOR ICD-9 250.00, 250.01). For QAC - HS accuchecks. 03/30/18   Thurnell Lose, MD  ONETOUCH VERIO test strip USE AS DIRECTED TO TEST FOUR TIMES A DAY 07/31/18   [name], MD     Critical care time: 32 min     Erick Colace ACNP-BC [facility] Pager # [phone] OR # [phone] if no answer

## 2021-04-07 NOTE — Assessment & Plan Note (Addendum)
Continue protonix.  S/p EGD 03/2021 during hospitalization showed chronic gastritis-suspicion for Candida-completed therapy with Diflucan

## 2021-04-07 NOTE — Assessment & Plan Note (Addendum)
Continue Plavix.  ?

## 2021-04-07 NOTE — ED Notes (Signed)
Called floor to request purple man.

## 2021-04-07 NOTE — H&P (Signed)
History and Physical    Patient: Elijah White VFI:[id number] DOB: 1940/03/08 DOA: 04/07/2021 DOS: the patient was seen and examined on 04/07/2021 PCP: Maryella Shivers, MD  Patient coming from: SNF - [facility]    Chief Complaint: chest pain and hypotension   HPI: Elijah White is a 81 y.o. male with medical history significant of  ESRD (HD MWF), AS s/p TAVR 2021, CAD, PD, blindness, PVD s/p L BKA, T2DM, HTN, HLD who was just discharged from the hospital yesterday on 2/1 after being admitted for acute cholecystitis requiring percutaneous drainage.  He was feeling fine on discharge, denies fever, eating and drinking.   He was at dialysis today and had some chest pain and hypotension and was sent to Ed. He only had about 1 hour of dialysis. He was feeling okay yesterday after he was discharged. During dialysis he states he got 8-9/10 pain over the right anterior chest wall. It didn't radiate anywhere. Hard to say how long it lasted. Described it as sharp. He states it just went away and he has not had it again. He states he does feel lightheaded when he turns his head side to side, but had no idea his blood pressure was low.  He states he is short of breath with exertion and has a residual cough from his pneumonia. Cough is productive with white phlegm.   He denies any fever/chills, palpitations, no abdominal pain, N/V/D, no swelling in right leg. He makes minimal urine, denies any urinary symptoms.   Recently had foley catheter placed during hospitalization 1/11 for acute urinary retention and complicated UTI. Has not followed up with urology.    ER Course:  vitals: afebrile, bp: 133/62, HR; 83, RR: 19, oxygen: 100% RA Pertinent labs: wbc: 16.2, hgb: 9.2, sodium: 132, bun: 28, creatinine: 6.0,  CXR: stable borderline cardiomegaly without overt edema. Possible small right pleural effusion. Slightly improved patchy right lung base opacity  In ED: started on vanc/cefepime, given 273ml  bolus   Review of Systems: As mentioned in the history of present illness. All other systems reviewed and are negative. Past Medical History:  Diagnosis Date   Anemia    low iron   Aortic stenosis    s/p TAVR 01/19/20   Arthritis    Cancer [facility])    prostate, s/p I-125 seed implant 05/18/05   Colon polyps ~ 1993 and 2003   Dr [name], [facility].  08/2001 colonoscopy: tubular adenoma at cecum.     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   Elevated cholesterol with high triglycerides    ESRD (end stage renal disease) (HCC)    [facility]   GERD (gastroesophageal reflux disease)    Glaucoma    Hypertension    Legally blind in left eye, as defined in [place]    has pinpoint vision in right eye   PAD (peripheral artery disease) ([place])    RBBB    Past Surgical History:  Procedure Laterality Date   ABDOMINAL AORTOGRAM W/LOWER EXTREMITY N/A 07/25/2020   Procedure: ABDOMINAL AORTOGRAM W/LOWER EXTREMITY;  Surgeon: Marty Heck, MD;  Location: [place] [facility];  Service: Cardiovascular;  Laterality: N/A;   AMPUTATION Left 08/12/2020   Procedure: LEFT BELOW KNEE AMPUTATION;  Surgeon: Marty Heck, MD;  Location: [place];  Service: Vascular;  Laterality: Left;   AV FISTULA PLACEMENT Left 03/20/2018   Procedure: ARTERIOVENOUS (AV) FISTULA CREATION ARM;  Surgeon: [name],  MD;  Location: [place];  Service: Vascular;  Laterality: Left;   [place] Left 05/21/2018   Procedure: LEFT BASILIC VEIN FISTULA SECOND STAGE;  Surgeon: [name], MD;  Location: [place];  Service: Vascular;  Laterality: Left;   BIOPSY  03/25/2021   Procedure: BIOPSY;  Surgeon: Otis Brace, MD;  Location: [place] [facility];  Service: Gastroenterology;;   COLONOSCOPY     COLONOSCOPY WITH PROPOFOL N/A 11/30/2019   Procedure: COLONOSCOPY WITH PROPOFOL;  Surgeon: Ronnette Juniper, MD;  Location: [place];  Service: Gastroenterology;   Laterality: N/A;   ESOPHAGOGASTRODUODENOSCOPY (EGD) WITH PROPOFOL N/A 03/25/2021   Procedure: ESOPHAGOGASTRODUODENOSCOPY (EGD) WITH PROPOFOL;  Surgeon: Otis Brace, MD;  Location: [place];  Service: Gastroenterology;  Laterality: N/A;   GLAUCOMA SURGERY  2019   multiple surgeries   HEMOSTASIS CLIP PLACEMENT  11/30/2019   Procedure: HEMOSTASIS CLIP PLACEMENT;  Surgeon: Ronnette Juniper, MD;  Location: [place];  Service: Gastroenterology;;   IR CHOLANGIOGRAM EXISTING TUBE  03/21/2021   IR PERC CHOLECYSTOSTOMY  03/16/2021   IR PERC CHOLECYSTOSTOMY  03/28/2021   IR REMOVAL BILIARY DRAIN  03/21/2021   KNEE ARTHROSCOPY     MULTIPLE EXTRACTIONS WITH ALVEOLOPLASTY N/A 12/30/2019   Procedure: MULTIPLE EXTRACTION WITH ALVEOLOPLASTY;  Surgeon: Charlaine Dalton, DMD;  Location: [place];  Service: Dentistry;  Laterality: N/A;   PERIPHERAL VASCULAR INTERVENTION Left 07/25/2020   Procedure: PERIPHERAL VASCULAR INTERVENTION;  Surgeon: Marty Heck, MD;  Location: [place] [facility];  Service: Cardiovascular;  Laterality: Left;  superficial femoral   POLYPECTOMY  11/30/2019   Procedure: POLYPECTOMY;  Surgeon: Ronnette Juniper, MD;  Location: [place];  Service: Gastroenterology;;   PROSTATE SURGERY     RIGHT/LEFT HEART CATH AND CORONARY ANGIOGRAPHY N/A 12/03/2019   Procedure: RIGHT/LEFT HEART CATH AND CORONARY ANGIOGRAPHY;  Surgeon: Burnell Blanks, MD;  Location: [place] [facility];  Service: Cardiovascular;  Laterality: N/A;   SUBMUCOSAL TATTOO INJECTION  11/30/2019   Procedure: SUBMUCOSAL TATTOO INJECTION;  Surgeon: Ronnette Juniper, MD;  Location: [place];  Service: Gastroenterology;;   TEE WITHOUT CARDIOVERSION N/A 01/19/2020   Procedure: TRANSESOPHAGEAL ECHOCARDIOGRAM (TEE);  Surgeon: Burnell Blanks, MD;  Location: [place] [facility];  Service: Open Heart Surgery;  Laterality: N/A;   TRANSCATHETER AORTIC VALVE REPLACEMENT, TRANSFEMORAL Left 01/19/2020   Procedure: TRANSCATHETER  AORTIC VALVE REPLACEMENT, LEFT TRANSFEMORAL;  Surgeon: Burnell Blanks, MD;  Location: [place] [facility];  Service: Open Heart Surgery;  Laterality: Left;   Social History:  reports that he quit smoking about 3 years ago. His smoking use included cigarettes. He smoked an average of .5 packs per day. He has never used smokeless tobacco. He reports that he does not drink alcohol and does not use drugs.  No Known Allergies  Family History  Problem Relation Age of Onset   CAD Mother     Prior to Admission medications   Medication Sig Start Date End Date Taking? Authorizing Provider  acetaminophen (TYLENOL) 500 MG tablet Take 1,000 mg by mouth every 6 (six) hours as needed for moderate pain or headache.   Yes [provider]  amoxicillin-clavulanate (AUGMENTIN) 500-125 MG tablet Take 1 tablet (500 mg total) by mouth daily for 6 days. 04/05/21 04/11/21 Yes Bonnielee Haff, MD  atorvastatin (LIPITOR) 10 MG tablet Take 1 tablet (10 mg total) by mouth in the morning. 08/16/20  Yes Rhyne, Samantha J, PA-C  B Complex-C-Folic Acid (DIALYVITE 935) 0.8 MG TABS Take 1 tablet by mouth in the  morning. 05/08/19  Yes [provider]  brimonidine (ALPHAGAN) 0.15 % ophthalmic solution Place 1 drop into the right eye 3 (three) times daily.   Yes [provider]  carbidopa-levodopa (SINEMET) 25-100 MG tablet Take 1 tablet by mouth 3 (three) times daily. Patient taking differently: Take 1 tablet by mouth 3 (three) times daily. 10am, 4pm, 10pm 10/19/20 05/13/21 Yes Curatolo, Adam, DO  cinacalcet (SENSIPAR) 30 MG tablet Take 2 tablets (60 mg total) by mouth every Monday, Wednesday, and Friday with hemodialysis. 04/05/21  Yes Bonnielee Haff, MD  clopidogrel (PLAVIX) 75 MG tablet Take 1 tablet (75 mg total) by mouth daily with breakfast. 07/25/20  Yes Marty Heck, MD  docusate sodium (COLACE) 100 MG capsule Take 100 mg by mouth 2 (two) times daily.   Yes [provider]   dorzolamide-timolol (COSOPT) 22.3-6.8 MG/ML ophthalmic solution Place 1 drop into the right eye 2 (two) times daily. 06/25/17  Yes [provider]  ferric citrate (AURYXIA) 1 GM 210 MG(Fe) tablet Take 420 mg by mouth 3 (three) times daily with meals.   Yes [provider]  fluconazole (DIFLUCAN) 100 MG tablet Take 1 tablet (100 mg total) by mouth daily for 4 days. 04/05/21 04/09/21 Yes Bonnielee Haff, MD  gabapentin (NEURONTIN) 100 MG capsule Take 1 capsule (100 mg total) by mouth daily. 10/19/20 05/13/21 Yes Curatolo, Adam, DO  hydrOXYzine (ATARAX) 25 MG tablet Take 25 mg by mouth every 8 (eight) hours as needed for itching. 04/07/21  Yes [provider]  hypromellose (GENTEAL SEVERE) 0.3 % GEL ophthalmic ointment Place 1 application into the left eye every 12 (twelve) hours as needed for dry eyes.   Yes [provider]  melatonin 5 MG TABS Take 5 mg by mouth at bedtime.   Yes [provider]  metoprolol tartrate (LOPRESSOR) 25 MG tablet Take 0.5 tablets (12.5 mg total) by mouth 2 (two) times daily. Hold if sbp less than 100 03/26/21 03/26/22 Yes Florencia Reasons, MD  Netarsudil-Latanoprost (ROCKLATAN) 0.02-0.005 % SOLN Place 1 drop into the right eye at bedtime.   Yes [provider]  nystatin (MYCOSTATIN) 100000 UNIT/ML suspension Take 5 mLs (500,000 Units total) by mouth 4 (four) times daily. 03/26/21  Yes Florencia Reasons, MD  oxyCODONE (OXY IR/ROXICODONE) 5 MG immediate release tablet Take 1 tablet (5 mg total) by mouth every 6 (six) hours as needed for moderate pain. 04/05/21  Yes Bonnielee Haff, MD  pantoprazole (PROTONIX) 40 MG tablet Take 1 tablet (40 mg total) by mouth daily at 12 noon. 08/16/20  Yes Rhyne, Hulen Shouts, PA-C  pilocarpine (PILOCAR) 4 % ophthalmic solution Place 1 drop into the right eye 4 (four) times daily.    Yes [provider]  polyethylene glycol (MIRALAX / GLYCOLAX) 17 g packet Take 17 g by mouth daily. Patient taking differently: Take  17 g by mouth every morning. Mix in 4 oz water and drink 03/26/21  Yes Florencia Reasons, MD  senna-docusate (SENOKOT-S) 8.6-50 MG tablet Take 1 tablet by mouth at bedtime. 03/26/21  Yes Florencia Reasons, MD  sevelamer carbonate (RENVELA) 800 MG tablet Take 2 tablets (1,600 mg total) by mouth 3 (three) times daily with meals. 03/26/21  Yes Florencia Reasons, MD  blood glucose meter kit and supplies KIT Dispense based on patient and insurance preference. Use up to four times daily as directed. (FOR ICD-9 250.00, 250.01). For QAC - HS accuchecks. 03/30/18   Thurnell Lose, MD  [facility] VERIO test strip USE AS DIRECTED  TO TEST FOUR TIMES A DAY 07/31/18   [name], MD    Physical Exam: Vitals:   04/07/21 1500 04/07/21 1830 04/07/21 1915 04/07/21 2004  BP: (!) 83/53 (!) 95/48 (!) 104/47 (!) 107/52  Pulse: 67 64  98  Resp: $Remo'16 10 16 13  'UifLk$ Temp:  (!) 97.5 F (36.4 C)  (!) 97.5 F (36.4 C)  TempSrc:  Oral  Oral  SpO2: 99% 100%  100%   General:  Appears calm and comfortable and is in NAD. Blind  Eyes:  PERRL, EOMI, normal lids, iris ENT:  grossly normal hearing, lips & tongue, mmm; missing teeth  Neck:  no LAD, masses or thyromegaly; no carotid bruits Cardiovascular:  RRR, soft systolic murmur No LE edema.  Respiratory:   CTA bilaterally with no wheezes/rales/rhonchi.  Normal respiratory effort. Abdomen:  soft, NT, ND, NABS. Has drain in RUQ. Scant serosanguinous fluid in bag.  Back:   normal alignment, no CVAT Skin:  no rash or induration seen on limited exam Musculoskeletal:  grossly normal tone BUE/RLE. BKA on the left. ROM, no bony abnormality Lower extremity:  No LE edema.  Limited foot exam with no ulcerations.  2+ distal pulses. Psychiatric:  grossly normal mood and affect, speech fluent and appropriate, AOx3 Neurologic:  CN 2-12 grossly intact, moves all extremities in coordinated fashion, sensation intact   Radiological Exams on Admission: Independently reviewed - see discussion in A/P where  applicable  CT ABDOMEN PELVIS W CONTRAST  Result Date: 04/07/2021 CLINICAL DATA:  Abdominal pain, acute, nonlocalized. EXAM: CT ABDOMEN AND PELVIS WITH CONTRAST TECHNIQUE: Multidetector CT imaging of the abdomen and pelvis was performed using the standard protocol following bolus administration of intravenous contrast. RADIATION DOSE REDUCTION: This exam was performed according to the departmental dose-optimization program which includes automated exposure control, adjustment of the mA and/or kV according to patient size and/or use of iterative reconstruction technique. CONTRAST:  159mL OMNIPAQUE IOHEXOL 350 MG/ML SOLN COMPARISON:  04/02/2021 FINDINGS: Lower chest: Moderate right pleural effusion has slightly increased in size with progressive, compressive atelectasis of the right lower lobe. Tiny left pleural effusion with minimal left basilar compressive atelectasis. Extensive multi-vessel coronary artery calcification again noted. 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 Cardiac size within normal limits. Hepatobiliary: Percutaneous cholecystostomy is unchanged with its retaining loop seen within the gallbladder lumen. The gallbladder, however, remains distended suggesting inadequate drainage of gallbladder contents and/or occlusion of the catheter. Previously noted hyperdensity related to hemorrhagic intraluminal contents is not as well appreciated on this examination possibly related to partial lysis of a blood product. Layering gallstones are again identified within the gallbladder lumen. Liver otherwise unremarkable. No intra or extrahepatic biliary ductal dilation. Pancreas: Unremarkable Spleen: Unremarkable Adrenals/Urinary Tract: 2.1 x 2.5 cm heterogeneously enhancing left adrenal nodule is again identified and appears stable since remote prior examination of 11/26/2019, though is indeterminate. The right adrenal gland is unremarkable. Moderate bilateral renal cortical atrophy again  noted. No enhancing intrarenal masses are identified. No hydronephrosis. No intrarenal or ureteral calculi. Vascular calcifications are noted within the renal hila bilaterally. The bladder is decompressed with a Foley catheter balloon seen within its lumen. Stomach/Bowel: Stomach is within normal limits. Appendix appears normal. No evidence of bowel wall thickening, distention, or inflammatory changes. Vascular/Lymphatic: Extensive aortoiliac atherosclerotic calcification with probable hemodynamically significant stenoses involving the lower extremity arterial inflow bilaterally. This is unchanged and not optimally assessed on this non arteriographic examination. No aortic aneurysm. No pathologic adenopathy within the abdomen and pelvis. Reproductive:  Brachytherapy seeds are seen within the prostate gland Other: There is progressive moderate diffuse subcutaneous body wall edema, particularly within the right flank which may relate to dependent positioning or superimposed local trauma or inflammation. Small fat containing left inguinal hernia. Rectum unremarkable. Musculoskeletal: No acute bone abnormality. No lytic or blastic bone lesion. IMPRESSION: Stable examination with percutaneous cholecystostomy in place but persistent distension of the gallbladder suggesting inadequate drainage of complex intraluminal contents and/or catheter occlusion. Evolution of intraluminal blood product within the gallbladder lumen noted. Layering gallstones again identified. Progressive anasarca with enlarging pleural effusions and progressive diffuse body wall subcutaneous edema. Asymmetric right flank subcutaneous body wall edema which may relate to dependent positioning or superimposed local trauma or inflammation. Correlation with clinical examination is recommended. 2.5 cm heterogeneously enhancing left adrenal nodule. While stable since remote prior examination of 11/26/2019, a malignant lesion such as a pheochromocytoma, is not  excluded and dedicated adrenal mass protocol CT or MRI examination is recommended once the patient's acute issues have resolved. Peripheral vascular disease with suspected bilateral lower extremity arterial inflow stenoses. This would be better assessed with CT arteriography if there is a clinical history of a lower extremity claudication or arterial vascular insufficiency. Aortic Atherosclerosis (ICD10-I70.0). Electronically Signed   By: Fidela Salisbury M.D.   On: 04/07/2021 19:22   DG Chest Port 1 View  Result Date: 04/07/2021 CLINICAL DATA:  Chest pain, cholecystostomy EXAM: PORTABLE CHEST 1 VIEW COMPARISON:  04/02/2021 chest radiograph. 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 Surgical clips overlie the lower right heart and left axilla. Partially visualized pigtail cholecystostomy tube in the right upper quadrant of the abdomen. Stable cardiomediastinal silhouette with borderline mild cardiomegaly. No pneumothorax. Slight blunting of the right costophrenic angle, unchanged, cannot exclude small right pleural effusion. No left pleural effusion. No overt pulmonary edema. Slightly low lung volumes. Patchy right lung base opacity, slightly improved. IMPRESSION: 1. Stable borderline mild cardiomegaly without overt pulmonary edema. 2. Possible small right pleural effusion, unchanged. 3. Slightly improved patchy right lung base opacity. Electronically Signed   By: Ilona Sorrel M.D.   On: 04/07/2021 13:08    EKG: Independently reviewed.  NSR with rate 71, RBBB; nonspecific ST changes with no evidence of acute ischemia   Labs on Admission: I have personally reviewed the available labs and imaging studies at the time of the admission.  Pertinent labs:   wbc: 16.2,  hgb: 9.2,  sodium: 132,  bun: 28,  creatinine: 6.0,   Assessment and Plan: * Hypotension- (present on admission) 81 year old male presenting from dialysis with hypotension and right sided chest pain -unclear obvious cause for hypotension.  Volume depletion vs. Sepsis vs. Hypotension in dialysis patient.  -admit to progressive -blood pressure have been consistently low. Consulted PCCM who recommended another bolus and goal sbp>90. If does not continue to maintain this despite IVF will move to ICU.  -sepsis work up with cultures and broad spectrum abx including vancomycin, cefepime and flagyl -recent perc drain for acute cholecystitis, pccm has ordered CT scan of abdomen  -has foley catheter placed during hospitalization 9/23 with complicated UTI. UA/culture ordered. Continue broad spectrum abx. May need foley changed -midodrine 5mg  Tid added   Acute cholecystitis  1/11-1/22 admitted and managed with percutaneous cholecystectomy tube which was unintentionally dislodged and subsequently removed on 1/19 and was discharged with oral antibiotics. Returned 1/23 with worsening pain N/V Underwent cholecystostomy tube placement by IR on 1/24.  Cultures no growth -pain no worse, no signs of infection  at drain site.  -drain in place with minimal serosanguineous fluid  -blood cultures, lactic acid  -broad spectrum abx -CT abdomen pending   -continue oral pain meds prn   Pneumonia during recent hospitalization - (present on admission) Also had pneumonia during last hospitalization  Discharged on oral augmetin No change in cough, continues with white sputum.  CXR today shows improvement in opacity, pleural effusion stable procalcitonin has been ordered continue broad spectrum abx for now   ESRD (end stage renal disease) ([place])- (present on admission) Nephrology has been consulted, no urgent need for dialysis at this time Only completed one hour of session today Adding midodrine $RemoveBeforeDE'5mg'VTNCApPREESroHk$  TID  Acute urinary retention with foley  At hospitalization on 2/63 he had complicated UTI, urinary retention and hydronephrosis requiring foley placement. Recommended f/u with urology outpatient, which he has not seen yet.  -continue foley for now  Type 2  diabetes mellitus with renal manifestations ([place])- (present on admission) a1c over a year ago at 7.1 Repeat a1c Very sensitive SSI and accuchecks per protocol   Severe aortic stenosis s/p TAVR  Stable. Echo 03/2021 EF of 60-65% with TAVR with normal function/placement   Parkinson's disease ([place])- (present on admission) Followed by neurology. Recently evaluated for tremor and per daughter may have early stages of PD At risk for aspiration, continued his dysphagia 2 diet.  Continue sinemet   Pruritus, unspecified- (present on admission) Continue hydroxyzine Tid prn   Anemia in chronic kidney disease- (present on admission) At baseline Continue to monitor   Pressure injury of skin- (present on admission) Wound care consult   Blindness/glaucoma  Continue with eye drops Verbal cues   Paroxysmal atrial fibrillation (HCC)  Currently in normal sinus rhythm.  Not on anticoagulation due to concern for GI bleed.   -plavix started back -EGD done last month  -holding metoprolol with hypotension   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 Continue lipitor  Hold plavix in case of intervention   GERD (gastroesophageal reflux disease)/esophageal candidiasis - (present on admission) Continue protonix  EGD 03/2021 during hospitalization. Chronic gastritis. Suspicious for candida Last diflucan today of 7 day course     Advance Care Planning:   Code Status: DNR   Consults: PCCM  DVT Prophylaxis: heparin   Family Communication: daughter: [name] Foster   Severity of Illness: The appropriate patient status for this patient is INPATIENT. Inpatient status is judged to be reasonable and necessary in order to provide the required intensity of service to ensure the patient's safety. The patient's presenting symptoms, physical exam findings, and initial radiographic and laboratory data in the context of their chronic comorbidities is felt to place them at high risk for further clinical  deterioration. Furthermore, it is not anticipated that the patient will be medically stable for discharge from the hospital within 2 midnights of admission.   * I certify that at the point of admission it is my clinical judgment that the patient will require inpatient hospital care spanning beyond 2 midnights from the point of admission due to high intensity of service, high risk for further deterioration and high frequency of surveillance required.*  Author: Orma Flaming, MD 04/07/2021 8:37 PM  For on call review [email or web address].

## 2021-04-07 NOTE — Assessment & Plan Note (Signed)
Stable. Echo 03/2021 EF of 60-65% with TAVR with normal function/placement

## 2021-04-07 NOTE — ED Provider Notes (Addendum)
[facility] EMERGENCY DEPARTMENT Provider Note   CSN: [id number] Arrival date & time: 04/07/21  1221     History Chief Complaint  Patient presents with   Chest Pain    Elijah White is a 81 y.o. male with end-stage renal disease on dialysis Monday Wednesday Friday, diabetes mellitus, hypertension who presents to the emergency department with hypotension.  Patient was at dialysis when he began complaining of left-sided chest tightness which did not radiate and had hypotension on vitals.  He did not receive dialysis today and was sent to the emergency department.  Old records were reviewed which did reveal the patient was discharged from the hospital 2 days ago after spending 2 weeks in the inpatient setting secondary to acute cholecystitis and is status post cholecystectomy and pleural effusion with right-sided pneumonia requiring thoracentesis with over a liter drained off.  Patient states that he was feeling well at discharge.  He denies any chest pain currently, shortness of breath, fever, chills, abdominal symptoms.  Patient does still make urine.   Chest Pain     Home Medications Prior to Admission medications   Medication Sig Start Date End Date Taking? Authorizing Provider  acetaminophen (TYLENOL) 500 MG tablet Take 1,000 mg by mouth every 6 (six) hours as needed for moderate pain or headache.   Yes [provider]  amoxicillin-clavulanate (AUGMENTIN) 500-125 MG tablet Take 1 tablet (500 mg total) by mouth daily for 6 days. 04/05/21 04/11/21 Yes Bonnielee Haff, MD  atorvastatin (LIPITOR) 10 MG tablet Take 1 tablet (10 mg total) by mouth in the morning. 08/16/20  Yes Rhyne, Samantha J, PA-C  B Complex-C-Folic Acid (DIALYVITE 824) 0.8 MG TABS Take 1 tablet by mouth in the morning. 05/08/19  Yes [provider]  brimonidine (ALPHAGAN) 0.15 % ophthalmic solution Place 1 drop into the right eye 3 (three) times daily.   Yes [provider]   carbidopa-levodopa (SINEMET) 25-100 MG tablet Take 1 tablet by mouth 3 (three) times daily. Patient taking differently: Take 1 tablet by mouth 3 (three) times daily. 10am, 4pm, 10pm 10/19/20 05/13/21 Yes Curatolo, Adam, DO  cinacalcet (SENSIPAR) 30 MG tablet Take 2 tablets (60 mg total) by mouth every Monday, Wednesday, and Friday with hemodialysis. 04/05/21  Yes Bonnielee Haff, MD  clopidogrel (PLAVIX) 75 MG tablet Take 1 tablet (75 mg total) by mouth daily with breakfast. 07/25/20  Yes Marty Heck, MD  docusate sodium (COLACE) 100 MG capsule Take 100 mg by mouth 2 (two) times daily.   Yes [provider]  dorzolamide-timolol (COSOPT) 22.3-6.8 MG/ML ophthalmic solution Place 1 drop into the right eye 2 (two) times daily. 06/25/17  Yes [provider]  ferric citrate (AURYXIA) 1 GM 210 MG(Fe) tablet Take 420 mg by mouth 3 (three) times daily with meals.   Yes [provider]  fluconazole (DIFLUCAN) 100 MG tablet Take 1 tablet (100 mg total) by mouth daily for 4 days. 04/05/21 04/09/21 Yes Bonnielee Haff, MD  gabapentin (NEURONTIN) 100 MG capsule Take 1 capsule (100 mg total) by mouth daily. 10/19/20 05/13/21 Yes Curatolo, Adam, DO  hydrOXYzine (ATARAX) 25 MG tablet Take 25 mg by mouth every 8 (eight) hours as needed for itching. 04/07/21  Yes [provider]  hypromellose (GENTEAL SEVERE) 0.3 % GEL ophthalmic ointment Place 1 application into the left eye every 12 (twelve) hours as needed for dry eyes.   Yes [provider]  melatonin 5 MG TABS Take 5 mg by mouth at  bedtime.   Yes [provider]  metoprolol tartrate (LOPRESSOR) 25 MG tablet Take 0.5 tablets (12.5 mg total) by mouth 2 (two) times daily. Hold if sbp less than 100 03/26/21 03/26/22 Yes Florencia Reasons, MD  Netarsudil-Latanoprost (ROCKLATAN) 0.02-0.005 % SOLN Place 1 drop into the right eye at bedtime.   Yes [provider]  nystatin (MYCOSTATIN) 100000 UNIT/ML suspension Take 5 mLs  (500,000 Units total) by mouth 4 (four) times daily. 03/26/21  Yes Florencia Reasons, MD  oxyCODONE (OXY IR/ROXICODONE) 5 MG immediate release tablet Take 1 tablet (5 mg total) by mouth every 6 (six) hours as needed for moderate pain. 04/05/21  Yes Bonnielee Haff, MD  pantoprazole (PROTONIX) 40 MG tablet Take 1 tablet (40 mg total) by mouth daily at 12 noon. 08/16/20  Yes Rhyne, Hulen Shouts, PA-C  pilocarpine (PILOCAR) 4 % ophthalmic solution Place 1 drop into the right eye 4 (four) times daily.    Yes [provider]  polyethylene glycol (MIRALAX / GLYCOLAX) 17 g packet Take 17 g by mouth daily. Patient taking differently: Take 17 g by mouth every morning. Mix in 4 oz water and drink 03/26/21  Yes Florencia Reasons, MD  senna-docusate (SENOKOT-S) 8.6-50 MG tablet Take 1 tablet by mouth at bedtime. 03/26/21  Yes Florencia Reasons, MD  sevelamer carbonate (RENVELA) 800 MG tablet Take 2 tablets (1,600 mg total) by mouth 3 (three) times daily with meals. 03/26/21  Yes Florencia Reasons, MD  blood glucose meter kit and supplies KIT Dispense based on patient and insurance preference. Use up to four times daily as directed. (FOR ICD-9 250.00, 250.01). For QAC - HS accuchecks. 03/30/18   Thurnell Lose, MD  ONETOUCH VERIO test strip USE AS DIRECTED TO TEST FOUR TIMES A DAY 07/31/18   [name], MD      Allergies    Patient has no known allergies.    Review of Systems   Review of Systems  Cardiovascular:  Positive for chest pain.  All other systems reviewed and are negative.  Physical Exam Updated Vital Signs BP (!) 83/53    Pulse 67    Temp (!) 97.3 F (36.3 C) (Oral)    Resp 16    SpO2 99%  Physical Exam Vitals and nursing note reviewed.  Constitutional:      General: He is not in acute distress.    Appearance: Normal appearance.  HENT:     Head: Normocephalic and atraumatic.  Eyes:     General:        Right eye: No discharge.        Left eye: No discharge.  Cardiovascular:     Comments: Regular rate and rhythm.   S1/S2 are distinct without any evidence of murmur, rubs, or gallops.  Radial pulses are 2+ bilaterally.  Dorsalis pedis pulses are 2+ bilaterally.  No evidence of pedal edema. Pulmonary:     Comments: Normal effort.  Does not appear to have crackles or wheezing.  Difficult to assess given the patient is not taking full breaths. Chest:    Abdominal:     General: Abdomen is flat. Bowel sounds are normal. There is no distension.     Tenderness: There is no abdominal tenderness. There is no guarding or rebound.  Musculoskeletal:        General: Normal range of motion.     Cervical back: Neck supple.     Comments: Left AKA.  Skin:    General: Skin is warm and dry.  Findings: No rash.  Neurological:     General: No focal deficit present.     Mental Status: He is alert.  Psychiatric:        Mood and Affect: Mood normal.        Behavior: Behavior normal.    ED Results / Procedures / Treatments   Labs (all labs ordered are listed, but only abnormal results are displayed) Labs Reviewed  BASIC METABOLIC PANEL - Abnormal; Notable for the following components:      Result Value   Sodium 132 (*)    CO2 20 (*)    Glucose, Bld 176 (*)    BUN 28 (*)    Creatinine, Ser 6.00 (*)    Calcium 8.7 (*)    GFR, Estimated 9 (*)    All other components within normal limits  CBC - Abnormal; Notable for the following components:   WBC 16.2 (*)    RBC 2.63 (*)    Hemoglobin 9.2 (*)    HCT 27.3 (*)    MCV 103.8 (*)    MCH 35.0 (*)    RDW 15.6 (*)    Platelets 411 (*)    All other components within normal limits  TROPONIN I (HIGH SENSITIVITY) - Abnormal; Notable for the following components:   Troponin I (High Sensitivity) 22 (*)    All other components within normal limits  TROPONIN I (HIGH SENSITIVITY) - Abnormal; Notable for the following components:   Troponin I (High Sensitivity) 23 (*)    All other components within normal limits  MRSA NEXT GEN BY PCR, NASAL  RESP PANEL BY RT-PCR (FLU  A&B, COVID) ARPGX2  CULTURE, BLOOD (ROUTINE X 2)  CULTURE, BLOOD (ROUTINE X 2)  PROTIME-INR  LACTIC ACID, PLASMA  LACTIC ACID, PLASMA  PROCALCITONIN  PROCALCITONIN    EKG None  Radiology DG Chest Port 1 View  Result Date: 04/07/2021 CLINICAL DATA:  Chest pain, cholecystostomy EXAM: PORTABLE CHEST 1 VIEW COMPARISON:  04/02/2021 chest radiograph. 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 Surgical clips overlie the lower right heart and left axilla. Partially visualized pigtail cholecystostomy tube in the right upper quadrant of the abdomen. Stable cardiomediastinal silhouette with borderline mild cardiomegaly. No pneumothorax. Slight blunting of the right costophrenic angle, unchanged, cannot exclude small right pleural effusion. No left pleural effusion. No overt pulmonary edema. Slightly low lung volumes. Patchy right lung base opacity, slightly improved. IMPRESSION: 1. Stable borderline mild cardiomegaly without overt pulmonary edema. 2. Possible small right pleural effusion, unchanged. 3. Slightly improved patchy right lung base opacity. Electronically Signed   By: Ilona Sorrel M.D.   On: 04/07/2021 13:08    Procedures Procedures    Medications Ordered in ED Medications  vancomycin (VANCOREADY) IVPB 1500 mg/300 mL (has no administration in time range)  ceFEPIme (MAXIPIME) 1 g in sodium chloride 0.9 % 100 mL IVPB (has no administration in time range)  vancomycin (VANCOREADY) IVPB 750 mg/150 mL (has no administration in time range)  Chlorhexidine Gluconate Cloth 2 % PADS 6 each (has no administration in time range)  sodium chloride 0.9 % bolus 500 mL (has no administration in time range)  sodium chloride 0.9 % bolus 250 mL (0 mLs Intravenous Stopped 04/07/21 1658)  ceFEPIme (MAXIPIME) 2 g in sodium chloride 0.9 % 100 mL IVPB (0 g Intravenous Stopped 04/07/21 1658)    ED Course/ Medical Decision Making/ A&P Clinical Course as of 04/07/21 1745  Fri Apr 07, 2021  1426 I discussed this  case with my  attending physician who cosigned this note including patient's presenting symptoms, physical exam, and planned diagnostics and interventions. Attending physician stated agreement with plan or made changes to plan which were implemented.     [CF]  1640 I spoke with Dr. Rogers Blocker with [facility] who agrees to admit the patient.  [CF]  [id number] I spoke with Dr. Posey Pronto with nephrology who agrees to consult on the patient. [CF]  1648 Critical care evaluated patient at bedside and recommended admission to the hospitalist.  They stated if he is persistently below 90s and have to start him on pressors please give them a call.  They will drop a consult note.  I also recommend additional 500 mL of fluid. [CF]  4562 I spoke with critical care who will come see the patient at bedside. [CF]    Clinical Course User Index [CF] Hendricks Limes, PA-C                           Medical Decision Making Amount and/or Complexity of Data Reviewed Labs: ordered. Radiology: ordered.  Risk Prescription drug management. Decision regarding hospitalization.   Elijah White is a 81 y.o. male with significant comorbidities to impact his care to include end-stage renal disease on dialysis Monday Wednesday Friday, hypertension, diabetes, and hyperlipidemia who presents to the emergency department with hypotension.  Patient was in the hospital for 2 weeks and discharged couple days ago.  Patient is currently asymptomatic and does not appear sick at this time but is hypotensive.  Patient does appear little dry.  I will plan to resuscitate him with a small amount of fluid given his renal disease.  I tried to perform an ultrasound-guided IV without success as patient did not have visible veins without risking arterial compromise.  We will likely have to consult IV team.  Given the chest pain, differential diagnosis includes ACS, heart failure, stress secondary to recurrent pneumonia/pleural effusions.  Given his  vital signs I have a low suspicion for sepsis at this time.  We will plan to get chest pain labs and give him gentle hydration as mentioned previously and will reassess.  Patient is also anticoagulated and I will plan to get a INR level.  I personally reviewed the imaging in comparison to the previous imaging and the chest x-ray does appear to be improved however pleural effusion versus lung opacity is still present although somewhat improved. Surgical drain in place likely from cholecystectomy.  CBC did reveal evidence of leukocytosis and chronic anemia.  There was evidence of thrombocytosis as well.  BMP did show elevated creatinine in the setting of end-stage renal disease.  Initial troponin was elevated this looks to be chronic.  PT/INR was normal.  On reevaluation, patient is still hypotensive.  Given the findings on chest x-ray and chronic comorbidities I do believe he would benefit from further evaluation in the hospital.  I spoke with Dr. Rogers Blocker with [facility] who agrees to admit.  Final Clinical Impression(s) / ED Diagnoses Final diagnoses:  Hypotension, unspecified hypotension type  Recurrent pneumonia    Rx / DC Orders ED Discharge Orders     None         Hendricks Limes, PA-C 04/07/21 1746    Hendricks Limes, PA-C 04/08/21 1034    Carmin Muskrat, MD 04/10/21 1104

## 2021-04-07 NOTE — Assessment & Plan Note (Addendum)
Suspect multifactorial etiology-hypovolemia-smoldering sepsis from ongoing cholecystitis, fluid shift with hemodialysis and autonomic dysfunction associated with Parkinson's disease.  BP stable over the past several days on midodrine.  Tolerated HD without any major issues.  Continue midodrine on discharge

## 2021-04-07 NOTE — ED Notes (Signed)
IV team at bedside 

## 2021-04-07 NOTE — Assessment & Plan Note (Addendum)
No evidence of blood loss-defer Aranesp/iron to nephrology

## 2021-04-07 NOTE — ED Triage Notes (Signed)
Pt BIB [facility] from dialysis. Pt did not receive treatment d/t feeling chest pain and weakness. EMS reports dialysis BP of 50/30 and BP of 70/50 with them. 8/10 center chest, non radiating pain. No aspirin d/t dentition, no nitro d/t pressure.

## 2021-04-07 NOTE — Progress Notes (Signed)
Pharmacy Antibiotic Note  Elijah White is a 81 y.o. male admitted on 04/07/2021 presenting with CP and weakness, consern for sepsis, pna.  Pharmacy has been consulted for vancomycin and cefepime dosing.  ESRD-HD usually MWF, did not receive todays iHD  Plan: Vancomycin 1500 mg IV x 1, then 750 mg q HD Cefepime 2g IV x 1, then 1g IV q 24h Add MRSA PCR Monitor renal function, Cx/PCR to narrow Vancomycin random level as needed     Temp (24hrs), Avg:97.3 F (36.3 C), Min:97.3 F (36.3 C), Max:97.3 F (36.3 C)  Recent Labs  Lab 04/01/21 1148 04/02/21 0041 04/03/21 0249 04/04/21 0334 04/05/21 0723 04/07/21 1251  WBC  --  23.7* 18.7* 15.5* 13.2* 16.2*  CREATININE 5.38* 5.96* 7.25* 5.24* 6.37*  --   VANCORANDOM  --   --  23  --   --   --     Estimated Creatinine Clearance: 7.8 mL/min (A) (by C-G formula based on SCr of 6.37 mg/dL (H)).    No Known Allergies  Bertis Ruddy, PharmD Clinical Pharmacist ED Pharmacist Phone # [phone] 04/07/2021 2:29 PM

## 2021-04-07 NOTE — Assessment & Plan Note (Signed)
81 year old presenting from dialysis with hypotension and right sided chest pain found to meet sepsis criteria with elevated WBC and hypotension

## 2021-04-07 NOTE — Assessment & Plan Note (Addendum)
Nephrology followed closely-please ensure resumption of outpatient HD.

## 2021-04-07 NOTE — Assessment & Plan Note (Deleted)
Continue hydroxyzine Tid prn

## 2021-04-08 DIAGNOSIS — E278 Other specified disorders of adrenal gland: Secondary | ICD-10-CM

## 2021-04-08 DIAGNOSIS — R338 Other retention of urine: Secondary | ICD-10-CM

## 2021-04-08 LAB — COMPREHENSIVE METABOLIC PANEL
ALT: <5 U/L (ref 0–44)
AST: 13 U/L — ABNORMAL LOW (ref 15–41)
Albumin: 1.7 g/dL — ABNORMAL LOW (ref 3.5–5.0)
Alkaline Phosphatase: 152 U/L — ABNORMAL HIGH (ref 38–126)
Anion gap: 13 (ref 5–15)
BUN: 29 mg/dL — ABNORMAL HIGH (ref 8–23)
CO2: 20 mmol/L — ABNORMAL LOW (ref 22–32)
Calcium: 8.6 mg/dL — ABNORMAL LOW (ref 8.9–10.3)
Chloride: 102 mmol/L (ref 98–111)
Creatinine, Ser: 6.39 mg/dL — ABNORMAL HIGH (ref 0.61–1.24)
GFR, Estimated: 8 mL/min — ABNORMAL LOW (ref >60)
Glucose, Bld: 139 mg/dL — ABNORMAL HIGH (ref 70–99)
Potassium: 3.7 mmol/L (ref 3.5–5.1)
Sodium: 135 mmol/L (ref 135–145)
Total Bilirubin: 0.5 mg/dL (ref 0.3–1.2)
Total Protein: 5.1 g/dL — ABNORMAL LOW (ref 6.5–8.1)

## 2021-04-08 LAB — CBC
HCT: 24.5 % — ABNORMAL LOW (ref 39.0–52.0)
Hemoglobin: 7.9 g/dL — ABNORMAL LOW (ref 13.0–17.0)
MCH: 33.5 pg (ref 26.0–34.0)
MCHC: 32.2 g/dL (ref 30.0–36.0)
MCV: 103.8 fL — ABNORMAL HIGH (ref 80.0–100.0)
Platelets: 398 10*3/uL (ref 150–400)
RBC: 2.36 MIL/uL — ABNORMAL LOW (ref 4.22–5.81)
RDW: 15.9 % — ABNORMAL HIGH (ref 11.5–15.5)
WBC: 13.7 10*3/uL — ABNORMAL HIGH (ref 4.0–10.5)
nRBC: 0 % (ref 0.0–0.2)

## 2021-04-08 LAB — GLUCOSE, CAPILLARY
Glucose-Capillary: 132 mg/dL — ABNORMAL HIGH (ref 70–99)
Glucose-Capillary: 138 mg/dL — ABNORMAL HIGH (ref 70–99)
Glucose-Capillary: 140 mg/dL — ABNORMAL HIGH (ref 70–99)
Glucose-Capillary: 111 mg/dL — ABNORMAL HIGH (ref 70–99)

## 2021-04-08 LAB — PROCALCITONIN: Procalcitonin: 1.64 ng/mL

## 2021-04-08 MED ORDER — MELATONIN 5 MG PO TABS
5.0000 mg | ORAL_TABLET | Freq: Every day | ORAL | Status: DC
  Administered 2021-04-08 – 2021-04-12 (×3): 5 mg via ORAL
  Filled 2021-04-08 (×4): qty 1

## 2021-04-08 MED ORDER — CINACALCET HCL 30 MG PO TABS
60.0000 mg | ORAL_TABLET | ORAL | Status: DC
  Administered 2021-04-12 (×2): 60 mg via ORAL
  Filled 2021-04-08 (×2): qty 2

## 2021-04-08 MED ORDER — DOCUSATE SODIUM 100 MG PO CAPS
100.0000 mg | ORAL_CAPSULE | Freq: Two times a day (BID) | ORAL | Status: DC
  Administered 2021-04-08 – 2021-04-12 (×6): 100 mg via ORAL
  Filled 2021-04-08 (×9): qty 1

## 2021-04-08 MED ORDER — NYSTATIN 100000 UNIT/ML MT SUSP
5.0000 mL | Freq: Four times a day (QID) | OROMUCOSAL | Status: DC
  Administered 2021-04-08 – 2021-04-13 (×13): 500000 [IU] via ORAL
  Filled 2021-04-08 (×14): qty 5

## 2021-04-08 MED ORDER — HYDROXYZINE HCL 25 MG PO TABS
25.0000 mg | ORAL_TABLET | Freq: Three times a day (TID) | ORAL | Status: DC | PRN
  Administered 2021-04-08 – 2021-04-09 (×2): 25 mg via ORAL
  Filled 2021-04-08 (×2): qty 1

## 2021-04-08 MED ORDER — MIDODRINE HCL 5 MG PO TABS
10.0000 mg | ORAL_TABLET | Freq: Three times a day (TID) | ORAL | Status: DC
  Administered 2021-04-08 – 2021-04-13 (×12): 10 mg via ORAL
  Filled 2021-04-08 (×13): qty 2

## 2021-04-08 MED ORDER — BRIMONIDINE TARTRATE 0.15 % OP SOLN
1.0000 [drp] | Freq: Three times a day (TID) | OPHTHALMIC | Status: DC
  Administered 2021-04-08 – 2021-04-13 (×13): 1 [drp] via OPHTHALMIC
  Filled 2021-04-08: qty 5

## 2021-04-08 MED ORDER — PANTOPRAZOLE SODIUM 40 MG PO TBEC
40.0000 mg | DELAYED_RELEASE_TABLET | Freq: Every day | ORAL | Status: DC
  Administered 2021-04-09 – 2021-04-12 (×4): 40 mg via ORAL
  Filled 2021-04-08 (×3): qty 1

## 2021-04-08 MED ORDER — SENNOSIDES-DOCUSATE SODIUM 8.6-50 MG PO TABS
1.0000 | ORAL_TABLET | Freq: Every day | ORAL | Status: DC
  Administered 2021-04-08 – 2021-04-12 (×4): 1 via ORAL
  Filled 2021-04-08 (×5): qty 1

## 2021-04-08 MED ORDER — GABAPENTIN 100 MG PO CAPS
100.0000 mg | ORAL_CAPSULE | Freq: Every day | ORAL | Status: DC
  Administered 2021-04-08 – 2021-04-13 (×5): 100 mg via ORAL
  Filled 2021-04-08 (×5): qty 1

## 2021-04-08 MED ORDER — RENA-VITE PO TABS
1.0000 | ORAL_TABLET | Freq: Every morning | ORAL | Status: DC
  Filled 2021-04-08: qty 1

## 2021-04-08 MED ORDER — HYPROMELLOSE 0.3 % OP GEL
1.0000 "application " | Freq: Two times a day (BID) | OPHTHALMIC | Status: DC | PRN
  Filled 2021-04-08: qty 3.5

## 2021-04-08 MED ORDER — RENA-VITE PO TABS
1.0000 | ORAL_TABLET | Freq: Every day | ORAL | Status: DC
  Administered 2021-04-08 – 2021-04-12 (×4): 1 via ORAL
  Filled 2021-04-08 (×5): qty 1

## 2021-04-08 MED ORDER — SEVELAMER CARBONATE 800 MG PO TABS
1600.0000 mg | ORAL_TABLET | Freq: Three times a day (TID) | ORAL | Status: DC
  Administered 2021-04-08 – 2021-04-13 (×9): 1600 mg via ORAL
  Filled 2021-04-08 (×11): qty 2

## 2021-04-08 MED ORDER — DORZOLAMIDE HCL-TIMOLOL MAL 2-0.5 % OP SOLN
1.0000 [drp] | Freq: Two times a day (BID) | OPHTHALMIC | Status: DC
  Administered 2021-04-08 – 2021-04-13 (×9): 1 [drp] via OPHTHALMIC
  Filled 2021-04-08: qty 10

## 2021-04-08 MED ORDER — PROSOURCE PLUS PO LIQD
30.0000 mL | Freq: Two times a day (BID) | ORAL | Status: DC
  Administered 2021-04-08 – 2021-04-13 (×7): 30 mL via ORAL
  Filled 2021-04-08 (×6): qty 30

## 2021-04-08 MED ORDER — CARBIDOPA-LEVODOPA 25-100 MG PO TABS
1.0000 | ORAL_TABLET | Freq: Three times a day (TID) | ORAL | Status: DC
  Administered 2021-04-08 – 2021-04-13 (×11): 1 via ORAL
  Filled 2021-04-08 (×14): qty 1

## 2021-04-08 MED ORDER — NETARSUDIL-LATANOPROST 0.02-0.005 % OP SOLN: 1.0000 [drp] | Freq: Every day | OPHTHALMIC | Status: DC

## 2021-04-08 MED ORDER — ATORVASTATIN CALCIUM 10 MG PO TABS
10.0000 mg | ORAL_TABLET | Freq: Every morning | ORAL | Status: DC
  Administered 2021-04-09 – 2021-04-12 (×2): 10 mg via ORAL
  Filled 2021-04-08 (×3): qty 1

## 2021-04-08 MED ORDER — PILOCARPINE HCL 4 % OP SOLN
1.0000 [drp] | Freq: Four times a day (QID) | OPHTHALMIC | Status: DC
  Administered 2021-04-08 – 2021-04-13 (×18): 1 [drp] via OPHTHALMIC
  Filled 2021-04-08: qty 15

## 2021-04-08 MED ORDER — FERRIC CITRATE 1 GM 210 MG(FE) PO TABS
420.0000 mg | ORAL_TABLET | Freq: Three times a day (TID) | ORAL | Status: DC
  Administered 2021-04-08 – 2021-04-13 (×9): 420 mg via ORAL
  Filled 2021-04-08 (×12): qty 2

## 2021-04-08 MED ORDER — OXYCODONE HCL 5 MG PO TABS
5.0000 mg | ORAL_TABLET | Freq: Four times a day (QID) | ORAL | Status: DC | PRN
  Administered 2021-04-08 – 2021-04-09 (×2): 5 mg via ORAL
  Filled 2021-04-08 (×2): qty 1

## 2021-04-08 MED ORDER — FLUCONAZOLE 100 MG PO TABS
100.0000 mg | ORAL_TABLET | Freq: Every day | ORAL | Status: AC
  Administered 2021-04-08: 100 mg via ORAL
  Filled 2021-04-08: qty 1

## 2021-04-08 NOTE — Progress Notes (Signed)
[facility] Progress Note Patient Name: Elijah White DOB: May 01, 1940 MRN: [id number]   Date of Service  04/08/2021  HPI/Events of Note  BP 94/43, Patient is alert and interactive when roused. Midodrine is ordered but the first dose was to be given at 8 AM.  eICU Interventions  First dose of Midodrine ordered to be given stat.        Kerry Kass [name] 04/08/2021, 4:55 AM

## 2021-04-08 NOTE — Progress Notes (Signed)
Patient presents with asymptomatic hypotension. Alert and oriented to person, place, time, and situation. Ogan MD called. First dose Midodrine to be given now.

## 2021-04-08 NOTE — Progress Notes (Signed)
[facility] Progress Note   Subjective:  Discharged on 04/05/21 to [facility]. Presented to his outpatient HD on 2/3 and noted to be very hypotensive (50/30 - 70/50 per notes) -> therefore directed to ED for evaluation. He only received 45 minutes of dialysis. He was given total 769mL IVF bolus and midodrine with improvement. Labs reasonably stable from prior discharge. He was re-cultured and started on Vanc/Cefepime again. Repeat abdominal CT showed perc chole tube possibly not draining correctly and also with B effusions/anasarca.  Today - BP is reasonable. He denies CP, dyspnea, abdominal pain. Cholecystomy tube remains in place with some bloody output.  Objective Vitals:   04/08/21 0631 04/08/21 0706 04/08/21 0900 04/08/21 1100  BP: (!) 92/48 (!) 97/50 (!) 102/52 (!) 118/59  Pulse: 74 64 65 70  Resp: 14 12 14 14   Temp:      TempSrc:      SpO2: 100% 100% 99% 98%   Physical Exam General: Frail man, NAD. Room air. 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   Liver Function Tests: Recent Labs  Lab 04/02/21 0041 04/05/21 0723 04/08/21 0133  AST 16  --  13*  ALT 6  --  <5  ALKPHOS 212*  --  152*  BILITOT 0.9  --  0.5  PROT 5.6*  --  5.1*  ALBUMIN 1.8* 1.6* 1.7*   CBC: Recent Labs  Lab 04/02/21 0041 04/03/21 0249 04/04/21 0334 04/05/21 0723 04/07/21 1251 04/08/21 0133  WBC 23.7* 18.7* 15.5* 13.2* 16.2*  13.7*  NEUTROABS 20.7* 15.9* 13.5*  --   --   --   HGB 8.3* 8.4* 8.4* 8.6* 9.2* 7.9*  HCT 24.8* 25.3* 25.8* 25.0* 27.3* 24.5*  MCV 102.1* 102.8* 102.8* 101.6* 103.8* 103.8*  PLT 466* 442* 489* 474* 411* 398   Studies/Results: CT ABDOMEN PELVIS W CONTRAST  Result Date: 04/07/2021 CLINICAL DATA:  Abdominal pain, acute, nonlocalized. EXAM: CT ABDOMEN AND PELVIS WITH CONTRAST TECHNIQUE: Multidetector CT imaging of the abdomen and pelvis was performed using the standard protocol following bolus administration of intravenous contrast. RADIATION DOSE REDUCTION: This exam was performed according to the departmental dose-optimization program which includes automated exposure control, adjustment of the mA and/or kV according to patient size and/or use of iterative reconstruction technique. CONTRAST:  116mL OMNIPAQUE IOHEXOL 350 MG/ML SOLN COMPARISON:  04/02/2021 FINDINGS: Lower chest: Moderate right pleural effusion has slightly increased in size with progressive, compressive atelectasis of the right lower lobe. Tiny left pleural effusion with minimal left basilar compressive atelectasis. Extensive multi-vessel coronary artery calcification again noted. 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 Cardiac size within normal limits. Hepatobiliary: Percutaneous cholecystostomy is unchanged with its retaining loop seen  within the gallbladder lumen. The gallbladder, however, remains distended suggesting inadequate drainage of gallbladder contents and/or occlusion of the catheter. Previously noted hyperdensity related to hemorrhagic intraluminal contents is not as well appreciated on this examination possibly related to partial lysis of a blood product. Layering gallstones are again identified within the gallbladder lumen. Liver otherwise unremarkable. No intra or extrahepatic biliary ductal dilation. Pancreas: Unremarkable Spleen: Unremarkable Adrenals/Urinary Tract: 2.1 x 2.5 cm heterogeneously enhancing  left adrenal nodule is again identified and appears stable since remote prior examination of 11/26/2019, though is indeterminate. The right adrenal gland is unremarkable. Moderate bilateral renal cortical atrophy again noted. No enhancing intrarenal masses are identified. No hydronephrosis. No intrarenal or ureteral calculi. Vascular calcifications are noted within the renal hila bilaterally. The bladder is decompressed with a Foley catheter balloon seen within its lumen. Stomach/Bowel: Stomach is within normal limits. Appendix appears normal. No evidence of bowel wall thickening, distention, or inflammatory changes. Vascular/Lymphatic: Extensive aortoiliac atherosclerotic calcification with probable hemodynamically significant stenoses involving the lower extremity arterial inflow bilaterally. This is unchanged and not optimally assessed on this non arteriographic examination. No aortic aneurysm. No pathologic adenopathy within the abdomen and pelvis. Reproductive: Brachytherapy seeds are seen within the prostate gland Other: There is progressive moderate diffuse subcutaneous body wall edema, particularly within the right flank which may relate to dependent positioning or superimposed local trauma or inflammation. Small fat containing left inguinal hernia. Rectum unremarkable. Musculoskeletal: No acute bone abnormality. No lytic or blastic bone lesion. IMPRESSION: Stable examination with percutaneous cholecystostomy in place but persistent distension of the gallbladder suggesting inadequate drainage of complex intraluminal contents and/or catheter occlusion. Evolution of intraluminal blood product within the gallbladder lumen noted. Layering gallstones again identified. Progressive anasarca with enlarging pleural effusions and progressive diffuse body wall subcutaneous edema. Asymmetric right flank subcutaneous body wall edema which may relate to dependent positioning or superimposed local trauma or inflammation.  Correlation with clinical examination is recommended. 2.5 cm heterogeneously enhancing left adrenal nodule. While stable since remote prior examination of 11/26/2019, a malignant lesion such as a pheochromocytoma, is not excluded and dedicated adrenal mass protocol CT or MRI examination is recommended once the patient's acute issues have resolved. Peripheral vascular disease with suspected bilateral lower extremity arterial inflow stenoses. This would be better assessed with CT arteriography if there is a clinical history of a lower extremity claudication or arterial vascular insufficiency. Aortic Atherosclerosis (ICD10-I70.0). Electronically Signed   By: Fidela Salisbury M.D.   On: 04/07/2021 19:22   DG Chest Port 1 View  Result Date: 04/07/2021 CLINICAL DATA:  Chest pain, cholecystostomy EXAM: PORTABLE CHEST 1 VIEW COMPARISON:  04/02/2021 chest radiograph. 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 Surgical clips overlie the lower right heart and left axilla. Partially visualized pigtail cholecystostomy tube in the right upper quadrant of the abdomen. Stable cardiomediastinal silhouette with borderline mild cardiomegaly. No pneumothorax. Slight blunting of the right costophrenic angle, unchanged, cannot exclude small right pleural effusion. No left pleural effusion. No overt pulmonary edema. Slightly low lung volumes. Patchy right lung base opacity, slightly improved. IMPRESSION: 1. Stable borderline mild cardiomegaly without overt pulmonary edema. 2. Possible small right pleural effusion, unchanged. 3. Slightly improved patchy right lung base opacity. Electronically Signed   By: Ilona Sorrel M.D.   On: 04/07/2021 13:08    Medications:  sodium chloride     ceFEPime (MAXIPIME) IV     metronidazole 500 mg (04/08/21 0918)   [START ON 04/10/2021] vancomycin      Chlorhexidine  Gluconate Cloth  6 each Topical Q0600   heparin  5,000 Units Subcutaneous Q8H   insulin aspart  0-6 Units Subcutaneous TID WC    midodrine  10 mg Oral TID WC   sodium chloride flush  3 mL Intravenous Q12H   Dialysis Orders: [place] MWF  4h  400/600  66kg 2/2 bath  P2  AVF  Hep none  - Sensipar 60 mg PO q HD  - Mircera 43mcg Iv q 2 weeks  Assessment/Plan: 1. Hypotension: Does not appear septic, but recultured and resumed on broad-spectrum abx. Given fluid bolus and started on midodrine 10mg  TID yesterday with improvement. 2. Acute cholecystitis: S/p perc chole tube 1/12-1/27, then replaced 1/24 - current.  3. ESRD: Usual MWF schedule - although he only got 45 minutes of dialysis yesterday, he does not appear overloaded on exam (despite CT report showing anasarca and recurring R pleural effusion), and labs are stable. Given that his BP remains on low side, will hold off on HD today and resume usual schedule on Monday 04/10/21. 3. BP/volume: BP low side, was volume resuscitated and started on midodrine 10mg  TID - follow. Hx R thoracentesis 1/28 with 1.1L removed. Try to UF as tolerated. 4. Anemia: Hgb 7.9 - last Aranesp on 2/1 per notes, not due for next dose yet. 5. Secondary hyperparathyroidism: VDRA recently d/c'd d/t hypercalcemia, Ca/Phos ok. 6. Nutrition: Alb low, adding supplements.  Veneta Penton, PA-C 04/08/2021, 11:32 AM  [employer]

## 2021-04-08 NOTE — Assessment & Plan Note (Signed)
Radiology recommending dedicated CT/MRI examination once acute issues have resolved-we will defer to patient's primary care practitioner.

## 2021-04-08 NOTE — Progress Notes (Signed)
PROGRESS NOTE        PATIENT DETAILS Name: Elijah White Age: 81 y.o. Sex: male Date of Birth: 04/06/1940 Admit Date: 04/07/2021 Admitting Physician Orma Flaming, MD [id number], Beatriz Chancellor, MD  Brief Summary: Patient is a 81 y.o.  male with history of ESRD on HD MWF, PAD-s/p left BKA, HTN, DM-2-with 2 recent hospitalizations for acute cholecystitis requiring cholecystostomy tube placement-referred to the ED from HD center for hypotension.  See below for further details  Significant Hospital events: 1/11-1/22>> hospitalized for sepsis due to acute cholecystitis-underwent cholecystostomy drain placement on 1/12-drain pulled out on 1/17-monitored-stabilized on abx and discharged to SNF without cholecystostomy drain 1/23-2/1>> readmitted for severe abdominal pain-cholecystostomy tube placed on 1/24-discharge back to SNF 2/4>> readmitted for hypotension/RUQ pain  Significant imaging studies: 2/3>> CT abdomen/pelvis: Per cholecystostomy in place-but persistent distention of the gallbladder, progressive anasarca, 2.5 cm left adrenal nodule  Significant microbiology data: 2/3>> COVID/flu PCR: Negative 2/3>> blood cultures: Pending  Procedures: None  Consults:  PCCM, Renal, IR  Subjective: Lying comfortably in bed-denies any chest pain or shortness of breath.  Objective: Vitals: Blood pressure (!) 97/50, pulse 64, temperature 98.3 F (36.8 C), temperature source Axillary, resp. rate 12, SpO2 100 %.   Exam: Gen Exam:Alert awake-not in any distress HEENT:atraumatic, normocephalic Chest: B/L clear to auscultation anteriorly CVS:S1S2 regular Abdomen:soft non tender, non distended Extremities:no edema-left AKA Neurology: Non focal Skin: no rash  Pertinent Labs/Radiology: CBC Latest Ref Rng & Units 04/08/2021 04/07/2021 04/05/2021  WBC 4.0 - 10.5 K/uL 13.7(H) 16.2(H) 13.2(H)  Hemoglobin 13.0 - 17.0 g/dL 7.9(L) 9.2(L) 8.6(L)  Hematocrit 39.0 - 52.0 % 24.5(L)  27.3(L) 25.0(L)  Platelets 150 - 400 K/uL 398 411(H) 474(H)    Lab Results  Component Value Date   NA 135 04/08/2021   K 3.7 04/08/2021   CL 102 04/08/2021   CO2 20 (L) 04/08/2021      Assessment/Plan: * Hypotension- (present on admission) Unclear etiology-concern for hypovolemia-smoldering sepsis due to ongoing cholecystitis.  BP responded to IV fluid boluses in the ED.  BP soft but stable this morning-increase midodrine to 10 mg 3 times daily, on broad-spectrum antibiotics.  Acute cholecystitis- (present on admission) Had RUQ pain yesterday-CT abdomen on 2/3 reviewed-concerned that [facility] cholecystostomy drain may not be draining the gallbladder well-IR reconsulted.  Will await response-continue broad-spectrum antimicrobial therapy for now.   Pneumonia during recent hospitalization - (present on admission) Suspect sympathetic/PNA from neighboring cholecystitis-already treated during prior hospitalization-in any event-already on broad-spectrum antimicrobial therapy.  Anemia in chronic kidney disease- (present on admission) No evidence of blood loss-defer Aranesp/iron to nephrology.   ESRD (end stage renal disease) ([place])- (present on admission) Nephrology consulted for HD.   Acute urinary retention with foley - (present on admission) At hospitalization on 0/38 he had complicated UTI, urinary retention and hydronephrosis requiring foley placement. Recommended f/u with urology outpatient, which he has not seen yet.  Continue indwelling Foley catheter for now    Paroxysmal atrial fibrillation (HCC)- (present on admission) Maintaining sinus rhythm-not on anticoagulation due to severity of anemia/concern for GI bleeding and advanced age.  EGD last month was stable.  Resume metoprolol when BP permits  Severe aortic stenosis s/p TAVR  Stable. Echo 03/2021 EF of 60-65% with TAVR with normal function/placement   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- (present on admission) Resume  Plavix after IR  eval to ensure no procedures are needed.  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)- (present on admission) CBG stable with SSI.   Recent Labs    04/07/21 2234 04/08/21 0807  GLUCAP 94 132*    GERD (gastroesophageal reflux disease)/esophageal candidiasis - (present on admission) Continue protonix.  S/p EGD 03/2021 during hospitalization showed chronic gastritis-suspicion for Candida-completed therapy with Diflucan  Blindness/glaucoma  Continue with eye drops.  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  Parkinson's disease ([place])- (present on admission) Followed by neurology in the outpatient setting-continue Sinemet-since at risk for aspiration-on dysphagia 2 diet  2.5 cm left adrenal nodule- (present on admission) Radiology recommending dedicated CT/MRI examination once acute issues have resolved-we will defer to patient's primary care practitioner.  Pressure injury of skin- (present on admission) Pressure Injury 03/28/21 Coccyx Medial Stage 2 -  Partial thickness loss of dermis presenting as a shallow open injury with a red, pink wound bed without slough. wound bed is pink, no drainage noted (Active)  03/28/21 1028  Location: Coccyx  Location Orientation: Medial  Staging: Stage 2 -  Partial thickness loss of dermis presenting as a shallow open injury with a red, pink wound bed without slough.  Wound Description (Comments): wound bed is pink, no drainage noted  Present on Admission: Yes       Sepsis (HCC)-resolved as of 04/07/2021, (present on admission) 81 year old presenting from dialysis with hypotension and right sided chest pain found to meet sepsis criteria with elevated WBC and hypotension   BMI: Estimated body mass index is 24.29 kg/m as calculated from the following:   Height as of 03/28/21: 5\' 5"  (1.651 m).   Weight as of 04/05/21: 66.2 kg.   Code status:   Code Status: DNR   DVT Prophylaxis: heparin injection 5,000 Units  Start: 04/07/21 2200   Family Communication: Daughter-[name] Foster-[phone] updated on 2/4   Disposition Plan: Status is: Inpatient Remains inpatient appropriate because: Hypotension-smoldering cholecystitis-possible sepsis-on IV antibiotics-not yet stable for discharge.   Planned Discharge Destination:Skilled nursing facility   Diet: Diet Order             Diet renal with fluid restriction Fluid restriction: 1200 mL Fluid; Room service appropriate? Yes; Fluid consistency: Thin  Diet effective now                     Antimicrobial agents: Anti-infectives (From admission, onward)    Start     Dose/Rate Route Frequency Ordered Stop   04/10/21 1200  vancomycin (VANCOREADY) IVPB 750 mg/150 mL        750 mg 150 mL/hr over 60 Minutes Intravenous Every M-W-F (Hemodialysis) 04/07/21 1431     04/08/21 1600  ceFEPIme (MAXIPIME) 1 g in sodium chloride 0.9 % 100 mL IVPB        1 g 200 mL/hr over 30 Minutes Intravenous Every 24 hours 04/07/21 1430     04/07/21 2000  metroNIDAZOLE (FLAGYL) IVPB 500 mg        500 mg 100 mL/hr over 60 Minutes Intravenous Every 12 hours 04/07/21 1830     04/07/21 1430  ceFEPIme (MAXIPIME) 2 g in sodium chloride 0.9 % 100 mL IVPB        2 g 200 mL/hr over 30 Minutes Intravenous  Once 04/07/21 1429 04/07/21 1658   04/07/21 1430  vancomycin (VANCOREADY) IVPB 1500 mg/300 mL        1,500 mg 150 mL/hr over 120 Minutes Intravenous  Once 04/07/21 1429 04/07/21 2052        MEDICATIONS: Scheduled Meds:  Chlorhexidine Gluconate Cloth  6 each Topical Q0600   heparin  5,000 Units Subcutaneous Q8H   insulin aspart  0-6 Units Subcutaneous TID WC   midodrine  10 mg Oral TID WC   sodium chloride flush  3 mL Intravenous Q12H   Continuous Infusions:  sodium chloride     ceFEPime (MAXIPIME) IV     metronidazole 500 mg (04/08/21 0918)   [START ON 04/10/2021] vancomycin     PRN Meds:.sodium chloride, acetaminophen **OR** acetaminophen, ondansetron **OR**  ondansetron (ZOFRAN) IV, sodium chloride flush   I have personally reviewed following labs and imaging studies  LABORATORY DATA: CBC: Recent Labs  Lab 04/02/21 0041 04/03/21 0249 04/04/21 0334 04/05/21 0723 04/07/21 1251 04/08/21 0133  WBC 23.7* 18.7* 15.5* 13.2* 16.2* 13.7*  NEUTROABS 20.7* 15.9* 13.5*  --   --   --   HGB 8.3* 8.4* 8.4* 8.6* 9.2* 7.9*  HCT 24.8* 25.3* 25.8* 25.0* 27.3* 24.5*  MCV 102.1* 102.8* 102.8* 101.6* 103.8* 103.8*  PLT 466* 442* 489* 474* 411* 448    Basic Metabolic Panel: Recent Labs  Lab 04/01/21 1148 04/02/21 0041 04/03/21 0249 04/04/21 0334 04/05/21 0723 04/07/21 1251 04/08/21 0133  NA 134*   < > 135 137 136 132* 135  K 3.9   < > 3.4* 3.9 3.6 3.7 3.7  CL 97*   < > 97* 101 103 100 102  CO2 23   < > 20* 23 22 20* 20*  GLUCOSE 108*   < > 87 115* 107* 176* 139*  BUN 23   < > 33* 21 27* 28* 29*  CREATININE 5.38*   < > 7.25* 5.24* 6.37* 6.00* 6.39*  CALCIUM 9.0   < > 8.9 8.6* 8.8* 8.7* 8.6*  PHOS 4.9*  --   --   --  4.3  --   --    < > = values in this interval not displayed.    GFR: Estimated Creatinine Clearance: 7.8 mL/min (A) (by C-G formula based on SCr of 6.39 mg/dL (H)).  Liver Function Tests: Recent Labs  Lab 04/01/21 1148 04/02/21 0041 04/05/21 0723 04/08/21 0133  AST  --  16  --  13*  ALT  --  6  --  <5  ALKPHOS  --  212*  --  152*  BILITOT  --  0.9  --  0.5  PROT  --  5.6*  --  5.1*  ALBUMIN 2.0* 1.8* 1.6* 1.7*   No results for input(s): LIPASE, AMYLASE in the last 168 hours. No results for input(s): AMMONIA in the last 168 hours.  Coagulation Profile: Recent Labs  Lab 04/07/21 1251  INR 0.9    Cardiac Enzymes: No results for input(s): CKTOTAL, CKMB, CKMBINDEX, TROPONINI in the last 168 hours.  BNP (last 3 results) No results for input(s): PROBNP in the last 8760 hours.  Lipid Profile: No results for input(s): CHOL, HDL, LDLCALC, TRIG, CHOLHDL, LDLDIRECT in the last 72 hours.  Thyroid Function  Tests: No results for input(s): TSH, T4TOTAL, FREET4, T3FREE, THYROIDAB in the last 72 hours.  Anemia Panel: No results for input(s): VITAMINB12, FOLATE, FERRITIN, TIBC, IRON, RETICCTPCT in the last 72 hours.  Urine analysis:    Component Value Date/Time   COLORURINE YELLOW 03/27/2021 1130   APPEARANCEUR HAZY (A) 03/27/2021 1130   LABSPEC 1.008 03/27/2021 1130   PHURINE 8.0 03/27/2021 1130   GLUCOSEU 50 (A) 03/27/2021 1130  HGBUR MODERATE (A) 03/27/2021 1130   BILIRUBINUR NEGATIVE 03/27/2021 1130   BILIRUBINUR negative 02/19/2018 1643   KETONESUR NEGATIVE 03/27/2021 1130   PROTEINUR 100 (A) 03/27/2021 1130   UROBILINOGEN 0.2 02/19/2018 1643   UROBILINOGEN 1.0 08/22/2012 1402   NITRITE NEGATIVE 03/27/2021 1130   LEUKOCYTESUR LARGE (A) 03/27/2021 1130    Sepsis Labs: Lactic Acid, Venous    Component Value Date/Time   LATICACIDVEN 1.8 04/07/2021 2237    MICROBIOLOGY: Recent Results (from the past 240 hour(s))  Body fluid culture w Gram Stain     Status: None   Collection Time: 04/01/21  1:37 PM   Specimen: Lung, Right Lower Lobe; Pleural Fluid  Result Value Ref Range Status   Specimen Description PLEURAL FLUID  Final   Special Requests LUNG RIGHT LOWER LOBE  Final   Gram Stain   Final    RARE WBC PRESENT,BOTH PMN AND MONONUCLEAR NO ORGANISMS SEEN    Culture   Final    NO GROWTH Performed at [facility], [place] [address], [place], [place] [address]    Report Status 04/04/2021 FINAL  Final  Culture, blood (routine x 2)     Status: None   Collection Time: 04/02/21  9:57 AM   Specimen: BLOOD RIGHT WRIST  Result Value Ref Range Status   Specimen Description BLOOD RIGHT WRIST  Final   Special Requests   Final    BOTTLES DRAWN AEROBIC AND ANAEROBIC Blood Culture results may not be optimal due to an inadequate volume of blood received in culture bottles   Culture   Final    NO GROWTH 5 DAYS Performed at [facility], [place] [address] [place], [place]  [address]    Report Status 04/07/2021 FINAL  Final  Culture, blood (routine x 2)     Status: None   Collection Time: 04/02/21  9:57 AM   Specimen: BLOOD RIGHT HAND  Result Value Ref Range Status   Specimen Description BLOOD RIGHT HAND  Final   Special Requests   Final    BOTTLES DRAWN AEROBIC AND ANAEROBIC Blood Culture results may not be optimal due to an inadequate volume of blood received in culture bottles   Culture   Final    NO GROWTH 5 DAYS Performed at [facility], [place] [address] [place], [place] [address]    Report Status 04/07/2021 FINAL  Final  MRSA Next Gen by PCR, Nasal     Status: None   Collection Time: 04/03/21  9:50 AM   Specimen: Nasal Mucosa; Nasal Swab  Result Value Ref Range Status   MRSA by PCR Next Gen NOT DETECTED NOT DETECTED Final    Comment: (NOTE) The GeneXpert MRSA Assay (FDA approved for NASAL specimens only), is one component of a comprehensive MRSA colonization surveillance program. It is not intended to diagnose MRSA infection nor to guide or monitor treatment for MRSA infections. Test performance is not FDA approved in patients less than 33 years old. Performed at [facility], [place] [address] [place],  [address]   Resp Panel by RT-PCR (Flu A&B, Covid) Nasopharyngeal Swab     Status: None   Collection Time: 04/05/21  9:59 AM   Specimen: Nasopharyngeal Swab; Nasopharyngeal(NP) swabs in vial transport medium  Result Value Ref Range Status   SARS Coronavirus 2 by RT PCR NEGATIVE NEGATIVE Final    Comment: (NOTE) SARS-CoV-2 target nucleic acids are NOT DETECTED.  The SARS-CoV-2 RNA is generally detectable in upper  respiratory specimens during the acute phase of infection. The lowest concentration of SARS-CoV-2 viral copies this assay can detect is 138 copies/mL. A negative result does not preclude SARS-Cov-2 infection and should not be used as the sole basis for treatment or other patient management decisions. A negative result  may occur with  improper specimen collection/handling, submission of specimen other than nasopharyngeal swab, presence of viral mutation(s) within the areas targeted by this assay, and inadequate number of viral copies(<138 copies/mL). A negative result must be combined with clinical observations, patient history, and epidemiological information. The expected result is Negative.  Fact Sheet for Patients:  EntrepreneurPulse.com.au  Fact Sheet for Healthcare Providers:  IncredibleEmployment.be  This test is no t yet approved or cleared by the [place] FDA and  has been authorized for detection and/or diagnosis of SARS-CoV-2 by FDA under an Emergency Use Authorization (EUA). This EUA will remain  in effect (meaning this test can be used) for the duration of the COVID-19 declaration under Section 564(b)(1) of the Act, 21 U.S.C.section 360bbb-3(b)(1), unless the authorization is terminated  or revoked sooner.       Influenza A by PCR NEGATIVE NEGATIVE Final   Influenza B by PCR NEGATIVE NEGATIVE Final    Comment: (NOTE) The Xpert Xpress SARS-CoV-2/FLU/RSV plus assay is intended as an aid in the diagnosis of influenza from Nasopharyngeal swab specimens and should not be used as a sole basis for treatment. Nasal washings and aspirates are unacceptable for Xpert Xpress SARS-CoV-2/FLU/RSV testing.  Fact Sheet for Patients: EntrepreneurPulse.com.au  Fact Sheet for Healthcare Providers: IncredibleEmployment.be  This test is not yet approved or cleared by the [place] FDA and has been authorized for detection and/or diagnosis of SARS-CoV-2 by FDA under an Emergency Use Authorization (EUA). This EUA will remain in effect (meaning this test can be used) for the duration of the COVID-19 declaration under Section 564(b)(1) of the Act, 21 U.S.C. section 360bbb-3(b)(1), unless the authorization is terminated  or revoked.  Performed at [facility], [place] [address], [place], [place] [address]   MRSA Next Gen by PCR, Nasal     Status: None   Collection Time: 04/07/21  2:30 PM   Specimen: Nasopharyngeal Swab; Nasal Swab  Result Value Ref Range Status   MRSA by PCR Next Gen NOT DETECTED NOT DETECTED Final    Comment: (NOTE) The GeneXpert MRSA Assay (FDA approved for NASAL specimens only), is one component of a comprehensive MRSA colonization surveillance program. It is not intended to diagnose MRSA infection nor to guide or monitor treatment for MRSA infections. Test performance is not FDA approved in patients less than 3 years old. Performed at [facility], [place] [address], [place], [place] [address]   Resp Panel by RT-PCR (Flu A&B, Covid) Nasopharyngeal Swab     Status: None   Collection Time: 04/07/21  3:02 PM   Specimen: Nasopharyngeal Swab; Nasopharyngeal(NP) swabs in vial transport medium  Result Value Ref Range Status   SARS Coronavirus 2 by RT PCR NEGATIVE NEGATIVE Final    Comment: (NOTE) SARS-CoV-2 target nucleic acids are NOT DETECTED.  The SARS-CoV-2 RNA is generally detectable in upper respiratory specimens during the acute phase of infection. The lowest concentration of SARS-CoV-2 viral copies this assay can detect is 138 copies/mL. A negative result does not preclude SARS-Cov-2 infection and should not be used as the sole basis for treatment or other patient management decisions. A negative result may occur with  improper specimen collection/handling, submission of  specimen other than nasopharyngeal swab, presence of viral mutation(s) within the areas targeted by this assay, and inadequate number of viral copies(<138 copies/mL). A negative result must be combined with clinical observations, patient history, and epidemiological information. The expected result is Negative.  Fact Sheet for Patients:  EntrepreneurPulse.com.au  Fact Sheet for  Healthcare Providers:  IncredibleEmployment.be  This test is no t yet approved or cleared by the [place] FDA and  has been authorized for detection and/or diagnosis of SARS-CoV-2 by FDA under an Emergency Use Authorization (EUA). This EUA will remain  in effect (meaning this test can be used) for the duration of the COVID-19 declaration under Section 564(b)(1) of the Act, 21 U.S.C.section 360bbb-3(b)(1), unless the authorization is terminated  or revoked sooner.       Influenza A by PCR NEGATIVE NEGATIVE Final   Influenza B by PCR NEGATIVE NEGATIVE Final    Comment: (NOTE) The Xpert Xpress SARS-CoV-2/FLU/RSV plus assay is intended as an aid in the diagnosis of influenza from Nasopharyngeal swab specimens and should not be used as a sole basis for treatment. Nasal washings and aspirates are unacceptable for Xpert Xpress SARS-CoV-2/FLU/RSV testing.  Fact Sheet for Patients: EntrepreneurPulse.com.au  Fact Sheet for Healthcare Providers: IncredibleEmployment.be  This test is not yet approved or cleared by the [place] FDA and has been authorized for detection and/or diagnosis of SARS-CoV-2 by FDA under an Emergency Use Authorization (EUA). This EUA will remain in effect (meaning this test can be used) for the duration of the COVID-19 declaration under Section 564(b)(1) of the Act, 21 U.S.C. section 360bbb-3(b)(1), unless the authorization is terminated or revoked.  Performed at [facility], [place] [address] [place], [place] [address]     RADIOLOGY STUDIES/RESULTS: CT ABDOMEN PELVIS W CONTRAST  Result Date: 04/07/2021 CLINICAL DATA:  Abdominal pain, acute, nonlocalized. EXAM: CT ABDOMEN AND PELVIS WITH CONTRAST TECHNIQUE: Multidetector CT imaging of the abdomen and pelvis was performed using the standard protocol following bolus administration of intravenous contrast. RADIATION DOSE REDUCTION: This exam was  performed according to the departmental dose-optimization program which includes automated exposure control, adjustment of the mA and/or kV according to patient size and/or use of iterative reconstruction technique. CONTRAST:  143mL OMNIPAQUE IOHEXOL 350 MG/ML SOLN COMPARISON:  04/02/2021 FINDINGS: Lower chest: Moderate right pleural effusion has slightly increased in size with progressive, compressive atelectasis of the right lower lobe. Tiny left pleural effusion with minimal left basilar compressive atelectasis. Extensive multi-vessel coronary artery calcification again noted. 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 Cardiac size within normal limits. Hepatobiliary: Percutaneous cholecystostomy is unchanged with its retaining loop seen within the gallbladder lumen. The gallbladder, however, remains distended suggesting inadequate drainage of gallbladder contents and/or occlusion of the catheter. Previously noted hyperdensity related to hemorrhagic intraluminal contents is not as well appreciated on this examination possibly related to partial lysis of a blood product. Layering gallstones are again identified within the gallbladder lumen. Liver otherwise unremarkable. No intra or extrahepatic biliary ductal dilation. Pancreas: Unremarkable Spleen: Unremarkable Adrenals/Urinary Tract: 2.1 x 2.5 cm heterogeneously enhancing left adrenal nodule is again identified and appears stable since remote prior examination of 11/26/2019, though is indeterminate. The right adrenal gland is unremarkable. Moderate bilateral renal cortical atrophy again noted. No enhancing intrarenal masses are identified. No hydronephrosis. No intrarenal or ureteral calculi. Vascular calcifications are noted within the renal hila bilaterally. The bladder is decompressed with a Foley catheter balloon seen within its lumen. Stomach/Bowel: Stomach is within normal limits. Appendix appears  normal. No evidence of bowel wall  thickening, distention, or inflammatory changes. Vascular/Lymphatic: Extensive aortoiliac atherosclerotic calcification with probable hemodynamically significant stenoses involving the lower extremity arterial inflow bilaterally. This is unchanged and not optimally assessed on this non arteriographic examination. No aortic aneurysm. No pathologic adenopathy within the abdomen and pelvis. Reproductive: Brachytherapy seeds are seen within the prostate gland Other: There is progressive moderate diffuse subcutaneous body wall edema, particularly within the right flank which may relate to dependent positioning or superimposed local trauma or inflammation. Small fat containing left inguinal hernia. Rectum unremarkable. Musculoskeletal: No acute bone abnormality. No lytic or blastic bone lesion. IMPRESSION: Stable examination with percutaneous cholecystostomy in place but persistent distension of the gallbladder suggesting inadequate drainage of complex intraluminal contents and/or catheter occlusion. Evolution of intraluminal blood product within the gallbladder lumen noted. Layering gallstones again identified. Progressive anasarca with enlarging pleural effusions and progressive diffuse body wall subcutaneous edema. Asymmetric right flank subcutaneous body wall edema which may relate to dependent positioning or superimposed local trauma or inflammation. Correlation with clinical examination is recommended. 2.5 cm heterogeneously enhancing left adrenal nodule. While stable since remote prior examination of 11/26/2019, a malignant lesion such as a pheochromocytoma, is not excluded and dedicated adrenal mass protocol CT or MRI examination is recommended once the patient's acute issues have resolved. Peripheral vascular disease with suspected bilateral lower extremity arterial inflow stenoses. This would be better assessed with CT arteriography if there is a clinical history of a lower extremity claudication or arterial  vascular insufficiency. Aortic Atherosclerosis (ICD10-I70.0). Electronically Signed   By: Fidela Salisbury M.D.   On: 04/07/2021 19:22   DG Chest Port 1 View  Result Date: 04/07/2021 CLINICAL DATA:  Chest pain, cholecystostomy EXAM: PORTABLE CHEST 1 VIEW COMPARISON:  04/02/2021 chest radiograph. 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 Surgical clips overlie the lower right heart and left axilla. Partially visualized pigtail cholecystostomy tube in the right upper quadrant of the abdomen. Stable cardiomediastinal silhouette with borderline mild cardiomegaly. No pneumothorax. Slight blunting of the right costophrenic angle, unchanged, cannot exclude small right pleural effusion. No left pleural effusion. No overt pulmonary edema. Slightly low lung volumes. Patchy right lung base opacity, slightly improved. IMPRESSION: 1. Stable borderline mild cardiomegaly without overt pulmonary edema. 2. Possible small right pleural effusion, unchanged. 3. Slightly improved patchy right lung base opacity. Electronically Signed   By: Ilona Sorrel M.D.   On: 04/07/2021 13:08     LOS: 1 day   Oren Binet, MD  [facility] Hospitalists    To contact the attending provider between 7A-7P or the covering provider during after hours 7P-7A, please log into the web site [email or web address] and access using universal [place] password for that web site. If you do not have the password, please call the hospital operator.  04/08/2021, 11:05 AM

## 2021-04-08 NOTE — Consult Note (Addendum)
[facility] Nurse Consult Note: Reason for Consult:Stage 2 pressure injury to coccyx.  Wound type: Pressure Pressure Injury POA: Yes Measurement:Per Nursing Flow Sheet:  2cm round x 0.1cm Wound GNF:AOZH, moist Drainage (amount, consistency, odor) small serous Periwound:intact, dry Dressing procedure/placement/frequency: I will implement guidance for nursing consistent with the skin care order set: topical care using an antimicrobial nonadherent topped with a silicone foam dressing, turning and repositioning to minimize time in the supine position and perhaps most importantly, a pressure redistribution chair cushion for use when OOB in chair, as the location of this PI is most consistent with a sitting skin injury.  [facility] nursing team will not follow, but will remain available to this patient, the nursing and medical teams.  Please re-consult if needed. Thanks, Maudie Flakes, MSN, RN, [place], Arther Abbott  Pager# [phone]

## 2021-04-08 NOTE — Hospital Course (Addendum)
Patient is a 81 y.o.  male with history of ESRD on HD MWF, PAD-s/p left BKA, HTN, DM-2-with 2 recent hospitalizations for acute cholecystitis requiring cholecystostomy tube placement-referred to the ED from HD center for hypotension.  See below for further details  Significant Hospital events: 1/11-1/22>> hospitalized for sepsis due to acute cholecystitis-underwent cholecystostomy drain placement on 1/12-drain pulled out on 1/17-monitored-stabilized on abx and discharged to SNF without cholecystostomy drain 1/23-2/1>> readmitted for severe abdominal pain-cholecystostomy tube placed on 1/24-discharge back to SNF 2/4>> readmitted for hypotension/RUQ pain  Significant imaging studies: 2/3>> CT abdomen/pelvis: Per cholecystostomy in place-but persistent distention of the gallbladder, progressive anasarca, 2.5 cm left adrenal nodule  Significant microbiology data: 2/3>> COVID/flu PCR: Negative 2/3>> blood cultures: Negative  Procedures: None  Consults:  PCCM, Renal, IR

## 2021-04-08 NOTE — Progress Notes (Signed)
Referring Physician(s): [place]  Supervising Physician: Sandi Mariscal  Patient Status:    Chief Complaint: Recent cholecystitis   Subjective: Pt resting quietly in bed; denies fever/chills, worsening abd pain,N/V; does have some dyspnea with exertion, buttocks pain and  mild tenderness at GB drain site but no evidence of leakage   Allergies: Patient has no known allergies.  Medications: Prior to Admission medications   Medication Sig Start Date End Date Taking? Authorizing Provider  acetaminophen (TYLENOL) 500 MG tablet Take 1,000 mg by mouth every 6 (six) hours as needed for moderate pain or headache.   Yes [provider]  amoxicillin-clavulanate (AUGMENTIN) 500-125 MG tablet Take 1 tablet (500 mg total) by mouth daily for 6 days. 04/05/21 04/11/21 Yes Bonnielee Haff, MD  atorvastatin (LIPITOR) 10 MG tablet Take 1 tablet (10 mg total) by mouth in the morning. 08/16/20  Yes Rhyne, Samantha J, PA-C  B Complex-C-Folic Acid (DIALYVITE 536) 0.8 MG TABS Take 1 tablet by mouth in the morning. 05/08/19  Yes [provider]  brimonidine (ALPHAGAN) 0.15 % ophthalmic solution Place 1 drop into the right eye 3 (three) times daily.   Yes [provider]  carbidopa-levodopa (SINEMET) 25-100 MG tablet Take 1 tablet by mouth 3 (three) times daily. Patient taking differently: Take 1 tablet by mouth 3 (three) times daily. 10am, 4pm, 10pm 10/19/20 05/13/21 Yes Curatolo, Adam, DO  cinacalcet (SENSIPAR) 30 MG tablet Take 2 tablets (60 mg total) by mouth every Monday, Wednesday, and Friday with hemodialysis. 04/05/21  Yes Bonnielee Haff, MD  clopidogrel (PLAVIX) 75 MG tablet Take 1 tablet (75 mg total) by mouth daily with breakfast. 07/25/20  Yes Marty Heck, MD  docusate sodium (COLACE) 100 MG capsule Take 100 mg by mouth 2 (two) times daily.   Yes [provider]  dorzolamide-timolol (COSOPT) 22.3-6.8 MG/ML ophthalmic solution Place 1 drop into the right eye 2  (two) times daily. 06/25/17  Yes [provider]  ferric citrate (AURYXIA) 1 GM 210 MG(Fe) tablet Take 420 mg by mouth 3 (three) times daily with meals.   Yes [provider]  fluconazole (DIFLUCAN) 100 MG tablet Take 1 tablet (100 mg total) by mouth daily for 4 days. 04/05/21 04/09/21 Yes Bonnielee Haff, MD  gabapentin (NEURONTIN) 100 MG capsule Take 1 capsule (100 mg total) by mouth daily. 10/19/20 05/13/21 Yes Curatolo, Adam, DO  hydrOXYzine (ATARAX) 25 MG tablet Take 25 mg by mouth every 8 (eight) hours as needed for itching. 04/07/21  Yes [provider]  hypromellose (GENTEAL SEVERE) 0.3 % GEL ophthalmic ointment Place 1 application into the left eye every 12 (twelve) hours as needed for dry eyes.   Yes [provider]  melatonin 5 MG TABS Take 5 mg by mouth at bedtime.   Yes [provider]  metoprolol tartrate (LOPRESSOR) 25 MG tablet Take 0.5 tablets (12.5 mg total) by mouth 2 (two) times daily. Hold if sbp less than 100 03/26/21 03/26/22 Yes Florencia Reasons, MD  Netarsudil-Latanoprost (ROCKLATAN) 0.02-0.005 % SOLN Place 1 drop into the right eye at bedtime.   Yes [provider]  nystatin (MYCOSTATIN) 100000 UNIT/ML suspension Take 5 mLs (500,000 Units total) by mouth 4 (four) times daily. 03/26/21  Yes Florencia Reasons, MD  oxyCODONE (OXY IR/ROXICODONE) 5 MG immediate release tablet Take 1 tablet (5 mg total) by mouth every 6 (six) hours as needed for moderate pain. 04/05/21  Yes Bonnielee Haff, MD  pantoprazole (PROTONIX) 40 MG tablet Take 1 tablet (40 mg  total) by mouth daily at 12 noon. 08/16/20  Yes Rhyne, Hulen Shouts, PA-C  pilocarpine (PILOCAR) 4 % ophthalmic solution Place 1 drop into the right eye 4 (four) times daily.    Yes [provider]  polyethylene glycol (MIRALAX / GLYCOLAX) 17 g packet Take 17 g by mouth daily. Patient taking differently: Take 17 g by mouth every morning. Mix in 4 oz water and drink 03/26/21  Yes Florencia Reasons, MD  senna-docusate  (SENOKOT-S) 8.6-50 MG tablet Take 1 tablet by mouth at bedtime. 03/26/21  Yes Florencia Reasons, MD  sevelamer carbonate (RENVELA) 800 MG tablet Take 2 tablets (1,600 mg total) by mouth 3 (three) times daily with meals. 03/26/21  Yes Florencia Reasons, MD  blood glucose meter kit and supplies KIT Dispense based on patient and insurance preference. Use up to four times daily as directed. (FOR ICD-9 250.00, 250.01). For QAC - HS accuchecks. 03/30/18   Thurnell Lose, MD  ONETOUCH VERIO test strip USE AS DIRECTED TO TEST FOUR TIMES A DAY 07/31/18   [name], MD     Vital Signs: BP (!) 118/59    Pulse 70    Temp 98.3 F (36.8 C) (Axillary)    Resp 14    SpO2 98%   Physical Exam awake/answering questions ok; GB drain intact, insertion site ok, mildly tender, drain flushed without difficulty, blood-tinged bile in gravity bag  Imaging: CT ABDOMEN PELVIS W CONTRAST  Result Date: 04/07/2021 CLINICAL DATA:  Abdominal pain, acute, nonlocalized. EXAM: CT ABDOMEN AND PELVIS WITH CONTRAST TECHNIQUE: Multidetector CT imaging of the abdomen and pelvis was performed using the standard protocol following bolus administration of intravenous contrast. RADIATION DOSE REDUCTION: This exam was performed according to the departmental dose-optimization program which includes automated exposure control, adjustment of the mA and/or kV according to patient size and/or use of iterative reconstruction technique. CONTRAST:  164mL OMNIPAQUE IOHEXOL 350 MG/ML SOLN COMPARISON:  04/02/2021 FINDINGS: Lower chest: Moderate right pleural effusion has slightly increased in size with progressive, compressive atelectasis of the right lower lobe. Tiny left pleural effusion with minimal left basilar compressive atelectasis. Extensive multi-vessel coronary artery calcification again noted. 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 Cardiac size within normal limits. Hepatobiliary: Percutaneous cholecystostomy is unchanged with its retaining  loop seen within the gallbladder lumen. The gallbladder, however, remains distended suggesting inadequate drainage of gallbladder contents and/or occlusion of the catheter. Previously noted hyperdensity related to hemorrhagic intraluminal contents is not as well appreciated on this examination possibly related to partial lysis of a blood product. Layering gallstones are again identified within the gallbladder lumen. Liver otherwise unremarkable. No intra or extrahepatic biliary ductal dilation. Pancreas: Unremarkable Spleen: Unremarkable Adrenals/Urinary Tract: 2.1 x 2.5 cm heterogeneously enhancing left adrenal nodule is again identified and appears stable since remote prior examination of 11/26/2019, though is indeterminate. The right adrenal gland is unremarkable. Moderate bilateral renal cortical atrophy again noted. No enhancing intrarenal masses are identified. No hydronephrosis. No intrarenal or ureteral calculi. Vascular calcifications are noted within the renal hila bilaterally. The bladder is decompressed with a Foley catheter balloon seen within its lumen. Stomach/Bowel: Stomach is within normal limits. Appendix appears normal. No evidence of bowel wall thickening, distention, or inflammatory changes. Vascular/Lymphatic: Extensive aortoiliac atherosclerotic calcification with probable hemodynamically significant stenoses involving the lower extremity arterial inflow bilaterally. This is unchanged and not optimally assessed on this non arteriographic examination. No aortic aneurysm. No pathologic adenopathy within the abdomen and pelvis. Reproductive: Brachytherapy seeds are seen within the  prostate gland Other: There is progressive moderate diffuse subcutaneous body wall edema, particularly within the right flank which may relate to dependent positioning or superimposed local trauma or inflammation. Small fat containing left inguinal hernia. Rectum unremarkable. Musculoskeletal: No acute bone abnormality.  No lytic or blastic bone lesion. IMPRESSION: Stable examination with percutaneous cholecystostomy in place but persistent distension of the gallbladder suggesting inadequate drainage of complex intraluminal contents and/or catheter occlusion. Evolution of intraluminal blood product within the gallbladder lumen noted. Layering gallstones again identified. Progressive anasarca with enlarging pleural effusions and progressive diffuse body wall subcutaneous edema. Asymmetric right flank subcutaneous body wall edema which may relate to dependent positioning or superimposed local trauma or inflammation. Correlation with clinical examination is recommended. 2.5 cm heterogeneously enhancing left adrenal nodule. While stable since remote prior examination of 11/26/2019, a malignant lesion such as a pheochromocytoma, is not excluded and dedicated adrenal mass protocol CT or MRI examination is recommended once the patient's acute issues have resolved. Peripheral vascular disease with suspected bilateral lower extremity arterial inflow stenoses. This would be better assessed with CT arteriography if there is a clinical history of a lower extremity claudication or arterial vascular insufficiency. Aortic Atherosclerosis (ICD10-I70.0). Electronically Signed   By: Fidela Salisbury M.D.   On: 04/07/2021 19:22   DG Chest Port 1 View  Result Date: 04/07/2021 CLINICAL DATA:  Chest pain, cholecystostomy EXAM: PORTABLE CHEST 1 VIEW COMPARISON:  04/02/2021 chest radiograph. 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 Surgical clips overlie the lower right heart and left axilla. Partially visualized pigtail cholecystostomy tube in the right upper quadrant of the abdomen. Stable cardiomediastinal silhouette with borderline mild cardiomegaly. No pneumothorax. Slight blunting of the right costophrenic angle, unchanged, cannot exclude small right pleural effusion. No left pleural effusion. No overt pulmonary edema. Slightly low lung volumes.  Patchy right lung base opacity, slightly improved. IMPRESSION: 1. Stable borderline mild cardiomegaly without overt pulmonary edema. 2. Possible small right pleural effusion, unchanged. 3. Slightly improved patchy right lung base opacity. Electronically Signed   By: Ilona Sorrel M.D.   On: 04/07/2021 13:08    Labs:  CBC: Recent Labs    04/04/21 0334 04/05/21 0723 04/07/21 1251 04/08/21 0133  WBC 15.5* 13.2* 16.2* 13.7*  HGB 8.4* 8.6* 9.2* 7.9*  HCT 25.8* 25.0* 27.3* 24.5*  PLT 489* 474* 411* 398    COAGS: Recent Labs    03/28/21 0319 04/07/21 1251  INR 1.2 0.9    BMP: Recent Labs    04/04/21 0334 04/05/21 0723 04/07/21 1251 04/08/21 0133  NA 137 136 132* 135  K 3.9 3.6 3.7 3.7  CL 101 103 100 102  CO2 23 22 20* 20*  GLUCOSE 115* 107* 176* 139*  BUN 21 27* 28* 29*  CALCIUM 8.6* 8.8* 8.7* 8.6*  CREATININE 5.24* 6.37* 6.00* 6.39*  GFRNONAA 10* 8* 9* 8*    LIVER FUNCTION TESTS: Recent Labs    03/28/21 0319 03/31/21 1102 04/01/21 1148 04/02/21 0041 04/05/21 0723 04/08/21 0133  BILITOT 0.9 0.4  --  0.9  --  0.5  AST 122* 20  --  16  --  13*  ALT 8 22  --  6  --  <5  ALKPHOS 409* 133*  --  212*  --  152*  PROT 5.3* 7.2  --  5.6*  --  5.1*  ALBUMIN 1.7* 3.4* 2.0* 1.8* 1.6* 1.7*    Assessment and Plan: Pt with recent cholecystitis, s/p perc GB drain on 03/28/21; currently afebrile, WBC 13.7(16.2), hgb  7.9(9.2), creat 6.39(on HD); prev bile cx neg; latest CT A/P yesterday:  Stable examination with percutaneous cholecystostomy in place but persistent distension of the gallbladder suggesting inadequate drainage of complex intraluminal contents and/or catheter occlusion. Evolution of intraluminal blood product within the gallbladder lumen noted. Layering gallstones again identified.   Progressive anasarca with enlarging pleural effusions and progressive diffuse body wall subcutaneous edema.   Asymmetric right flank subcutaneous body wall edema which may  relate to dependent positioning or superimposed local trauma or inflammation. Correlation with clinical examination is recommended.   2.5 cm heterogeneously enhancing left adrenal nodule. While stable since remote prior examination of 11/26/2019, a malignant lesion such as a pheochromocytoma, is not excluded and dedicated adrenal mass protocol CT or MRI examination is recommended once the patient's acute issues have resolved.   Peripheral vascular disease with suspected bilateral lower extremity arterial inflow stenoses. This would be better assessed with CT arteriography if there is a clinical history of a lower extremity claudication or arterial vascular insufficiency.   Aortic Atherosclerosis  Drain Location: RUQ Size: 10 fr Date of placement: 03/28/21  Currently to: Drain collection device: gravity 24 hour output:  Output by Drain (mL) 04/06/21 0701 - 04/06/21 1900 04/06/21 1901 - 04/07/21 0700 04/07/21 0701 - 04/07/21 1900 04/07/21 1901 - 04/08/21 0700 04/08/21 0701 - 04/08/21 1219  Biliary Tube Cook slip-coat 10 Fr. RUQ           Current examination: Flushes/aspirates easily.  Insertion site unremarkable. Suture in place. Dressed appropriately.   Plan: Continue TID flushes with 5 cc NS. Record output Q shift. Dressing changes QD or PRN if soiled.  Call IR APP or on call IR MD if difficulty flushing or sudden change in drain output.    Pt already has IR clinic f/u ordered but if drain output stops or remains minimal consider f/u cholangiogram; other plans per TRH/NEPH/CCS     Electronically Signed: D. Rowe Robert, PA-C 04/08/2021, 12:12 PM   I spent a total of 15 Minutes at the the patient's bedside AND on the patient's hospital floor or unit, greater than 50% of which was counseling/coordinating care for gallbladder drain    Patient ID: STONEWALL DOSS, male   DOB: [birth year], 81 y.o.   MRN: [id number]

## 2021-04-09 LAB — RENAL FUNCTION PANEL
Albumin: 1.6 g/dL — ABNORMAL LOW (ref 3.5–5.0)
Anion gap: 15 (ref 5–15)
BUN: 38 mg/dL — ABNORMAL HIGH (ref 8–23)
CO2: 20 mmol/L — ABNORMAL LOW (ref 22–32)
Calcium: 8.5 mg/dL — ABNORMAL LOW (ref 8.9–10.3)
Chloride: 100 mmol/L (ref 98–111)
Creatinine, Ser: 7.09 mg/dL — ABNORMAL HIGH (ref 0.61–1.24)
GFR, Estimated: 7 mL/min — ABNORMAL LOW (ref >60)
Glucose, Bld: 128 mg/dL — ABNORMAL HIGH (ref 70–99)
Phosphorus: 3.9 mg/dL (ref 2.5–4.6)
Potassium: 3.6 mmol/L (ref 3.5–5.1)
Sodium: 135 mmol/L (ref 135–145)

## 2021-04-09 LAB — CBC
HCT: 24.4 % — ABNORMAL LOW (ref 39.0–52.0)
Hemoglobin: 8 g/dL — ABNORMAL LOW (ref 13.0–17.0)
MCH: 33.9 pg (ref 26.0–34.0)
MCHC: 32.8 g/dL (ref 30.0–36.0)
MCV: 103.4 fL — ABNORMAL HIGH (ref 80.0–100.0)
Platelets: 373 10*3/uL (ref 150–400)
RBC: 2.36 MIL/uL — ABNORMAL LOW (ref 4.22–5.81)
RDW: 16 % — ABNORMAL HIGH (ref 11.5–15.5)
WBC: 12.7 10*3/uL — ABNORMAL HIGH (ref 4.0–10.5)
nRBC: 0.2 % (ref 0.0–0.2)

## 2021-04-09 LAB — GLUCOSE, CAPILLARY
Glucose-Capillary: 103 mg/dL — ABNORMAL HIGH (ref 70–99)
Glucose-Capillary: 99 mg/dL (ref 70–99)
Glucose-Capillary: 89 mg/dL (ref 70–99)
Glucose-Capillary: 92 mg/dL (ref 70–99)

## 2021-04-09 LAB — PROCALCITONIN: Procalcitonin: 1.38 ng/mL

## 2021-04-09 MED ORDER — LATANOPROST 0.005 % OP SOLN
1.0000 [drp] | Freq: Every day | OPHTHALMIC | Status: DC
  Administered 2021-04-09 – 2021-04-12 (×3): 1 [drp] via OPHTHALMIC
  Filled 2021-04-09: qty 2.5

## 2021-04-09 MED ORDER — CLOPIDOGREL BISULFATE 75 MG PO TABS
75.0000 mg | ORAL_TABLET | Freq: Every day | ORAL | Status: DC
  Administered 2021-04-11 – 2021-04-13 (×3): 75 mg via ORAL
  Filled 2021-04-09 (×3): qty 1

## 2021-04-09 NOTE — Progress Notes (Signed)
°   04/09/21 0437  Vitals  Temp 98.6 F (37 C)  Temp Source Oral  BP (!) 96/51  MAP (mmHg) (!) 64  BP Location Right Arm  BP Method Automatic  Patient Position (if appropriate) Lying  Pulse Rate 65  Pulse Rate Source Monitor  ECG Heart Rate 65  Resp 13

## 2021-04-09 NOTE — Progress Notes (Signed)
[facility] Progress Note   Subjective:  Seen in room. Looks comfortable. Denies CP or dyspnea. BP low/stable today.   Objective Vitals:   04/09/21 0015 04/09/21 0437 04/09/21 0639 04/09/21 0831  BP: (!) 115/55 (!) 96/51 (!) 93/47 (!) 91/50  Pulse: 66 65 61 65  Resp: 20 13 11 17   Temp: 97.6 F (36.4 C) 98.6 F (37 C) 97.6 F (36.4 C) 98 F (36.7 C)  TempSrc: Oral Oral Axillary Oral  SpO2:  98% 98% 99%   Physical Exam General: Frail man, NAD. Room air. 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 Dialysis Access: LUE AVF  Additional Objective Labs: Basic Metabolic Panel: Recent Labs  Lab 04/05/21 0723 04/07/21 1251 04/08/21 0133 04/09/21 0209  NA 136 132* 135 135  K 3.6 3.7 3.7 3.6  CL 103 100 102 100  CO2 22 20* 20* 20*  GLUCOSE 107* 176* 139* 128*  BUN 27* 28* 29* 38*  CREATININE 6.37* 6.00* 6.39* 7.09*  CALCIUM 8.8* 8.7* 8.6* 8.5*  PHOS 4.3  --   --  3.9   Liver Function Tests: Recent Labs  Lab 04/05/21 0723 04/08/21 0133 04/09/21 0209  AST  --  13*  --   ALT  --  <5  --   ALKPHOS  --  152*  --   BILITOT  --  0.5  --   PROT  --  5.1*  --   ALBUMIN 1.6* 1.7* 1.6*   CBC: Recent Labs  Lab 04/03/21 0249 04/04/21 0334 04/05/21 0723 04/07/21 1251 04/08/21 0133 04/09/21 0209  WBC 18.7* 15.5* 13.2* 16.2* 13.7* 12.7*  NEUTROABS 15.9* 13.5*  --   --   --   --   HGB 8.4* 8.4* 8.6* 9.2* 7.9* 8.0*  HCT 25.3* 25.8* 25.0* 27.3* 24.5* 24.4*  MCV 102.8* 102.8* 101.6* 103.8* 103.8* 103.4*  PLT 442* 489* 474* 411* 398 373   Studies/Results: CT ABDOMEN PELVIS W CONTRAST  Result Date: 04/07/2021 CLINICAL DATA:  Abdominal pain, acute, nonlocalized. EXAM: CT ABDOMEN AND PELVIS WITH CONTRAST TECHNIQUE: Multidetector CT imaging of the abdomen and pelvis was performed using the standard protocol following bolus administration of intravenous contrast. RADIATION DOSE REDUCTION: This  exam was performed according to the departmental dose-optimization program which includes automated exposure control, adjustment of the mA and/or kV according to patient size and/or use of iterative reconstruction technique. CONTRAST:  140mL OMNIPAQUE IOHEXOL 350 MG/ML SOLN COMPARISON:  04/02/2021 FINDINGS: Lower chest: Moderate right pleural effusion has slightly increased in size with progressive, compressive atelectasis of the right lower lobe. Tiny left pleural effusion with minimal left basilar compressive atelectasis. Extensive multi-vessel coronary artery calcification again noted. 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 Cardiac size within normal limits. Hepatobiliary: Percutaneous cholecystostomy is unchanged with its retaining loop seen within the gallbladder lumen. The gallbladder, however, remains distended suggesting inadequate drainage of gallbladder contents and/or occlusion of the catheter. Previously noted hyperdensity related to hemorrhagic intraluminal contents is not as well appreciated on this examination possibly related to partial lysis of a blood product. Layering gallstones are again identified within the gallbladder lumen. Liver otherwise unremarkable. No intra or extrahepatic biliary ductal dilation. Pancreas: Unremarkable Spleen: Unremarkable Adrenals/Urinary Tract: 2.1 x 2.5 cm heterogeneously enhancing left adrenal nodule is again identified and appears stable since remote prior examination of 11/26/2019, though is indeterminate. The right adrenal gland is unremarkable. Moderate bilateral renal cortical atrophy again noted. No enhancing intrarenal  masses are identified. No hydronephrosis. No intrarenal or ureteral calculi. Vascular calcifications are noted within the renal hila bilaterally. The bladder is decompressed with a Foley catheter balloon seen within its lumen. Stomach/Bowel: Stomach is within normal limits. Appendix appears normal. No evidence of bowel wall  thickening, distention, or inflammatory changes. Vascular/Lymphatic: Extensive aortoiliac atherosclerotic calcification with probable hemodynamically significant stenoses involving the lower extremity arterial inflow bilaterally. This is unchanged and not optimally assessed on this non arteriographic examination. No aortic aneurysm. No pathologic adenopathy within the abdomen and pelvis. Reproductive: Brachytherapy seeds are seen within the prostate gland Other: There is progressive moderate diffuse subcutaneous body wall edema, particularly within the right flank which may relate to dependent positioning or superimposed local trauma or inflammation. Small fat containing left inguinal hernia. Rectum unremarkable. Musculoskeletal: No acute bone abnormality. No lytic or blastic bone lesion. IMPRESSION: Stable examination with percutaneous cholecystostomy in place but persistent distension of the gallbladder suggesting inadequate drainage of complex intraluminal contents and/or catheter occlusion. Evolution of intraluminal blood product within the gallbladder lumen noted. Layering gallstones again identified. Progressive anasarca with enlarging pleural effusions and progressive diffuse body wall subcutaneous edema. Asymmetric right flank subcutaneous body wall edema which may relate to dependent positioning or superimposed local trauma or inflammation. Correlation with clinical examination is recommended. 2.5 cm heterogeneously enhancing left adrenal nodule. While stable since remote prior examination of 11/26/2019, a malignant lesion such as a pheochromocytoma, is not excluded and dedicated adrenal mass protocol CT or MRI examination is recommended once the patient's acute issues have resolved. Peripheral vascular disease with suspected bilateral lower extremity arterial inflow stenoses. This would be better assessed with CT arteriography if there is a clinical history of a lower extremity claudication or arterial  vascular insufficiency. Aortic Atherosclerosis (ICD10-I70.0). Electronically Signed   By: Fidela Salisbury M.D.   On: 04/07/2021 19:22   DG Chest Port 1 View  Result Date: 04/07/2021 CLINICAL DATA:  Chest pain, cholecystostomy EXAM: PORTABLE CHEST 1 VIEW COMPARISON:  04/02/2021 chest radiograph. 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 Surgical clips overlie the lower right heart and left axilla. Partially visualized pigtail cholecystostomy tube in the right upper quadrant of the abdomen. Stable cardiomediastinal silhouette with borderline mild cardiomegaly. No pneumothorax. Slight blunting of the right costophrenic angle, unchanged, cannot exclude small right pleural effusion. No left pleural effusion. No overt pulmonary edema. Slightly low lung volumes. Patchy right lung base opacity, slightly improved. IMPRESSION: 1. Stable borderline mild cardiomegaly without overt pulmonary edema. 2. Possible small right pleural effusion, unchanged. 3. Slightly improved patchy right lung base opacity. Electronically Signed   By: Ilona Sorrel M.D.   On: 04/07/2021 13:08    Medications:  sodium chloride     ceFEPime (MAXIPIME) IV 1 g (04/08/21 1545)   metronidazole 500 mg (04/08/21 2153)   [START ON 04/10/2021] vancomycin      (feeding supplement) PROSource Plus  30 mL Oral BID BM   atorvastatin  10 mg Oral q AM   brimonidine  1 drop Right Eye TID   carbidopa-levodopa  1 tablet Oral TID   Chlorhexidine Gluconate Cloth  6 each Topical Q0600   [START ON 04/10/2021] cinacalcet  60 mg Oral Q M,W,F-HD   docusate sodium  100 mg Oral BID   dorzolamide-timolol  1 drop Right Eye BID   ferric citrate  420 mg Oral TID WC   gabapentin  100 mg Oral Daily   heparin  5,000 Units Subcutaneous Q8H   insulin aspart  0-6 Units  Subcutaneous TID WC   melatonin  5 mg Oral QHS   midodrine  10 mg Oral TID WC   multivitamin  1 tablet Oral QHS   Netarsudil-Latanoprost  1 drop Right Eye QHS   nystatin  5 mL Oral QID   pantoprazole   40 mg Oral Q1200   pilocarpine  1 drop Right Eye QID   senna-docusate  1 tablet Oral QHS   sevelamer carbonate  1,600 mg Oral TID WC   sodium chloride flush  3 mL Intravenous Q12H    Dialysis Orders: [place] MWF  4h  400/600  66kg 2/2 bath  P2  AVF  Hep none  - Sensipar 60 mg PO q HD  - Mircera 60mcg Iv q 2 weeks   Assessment/Plan: 1. Hypotension: On admit. Does not appear septic, but recultured and resumed on broad-spectrum abx. Given fluid bolus and started on midodrine 10mg  TID yesterday with improvement. 2. Acute cholecystitis: S/p perc chole tube 1/12-1/27, then replaced 1/24 - current.  3. ESRD: Usual MWF schedule - Next HD 2/6. Does not appear overloaded on exam (despite CT report showing anasarca and recurring R pleural effusion) - try to UF as tolerated. 3. BP/volume: BP low side, was volume resuscitated and started on midodrine 10mg  TID - follow. Hx R thoracentesis 1/28 with 1.1L removed. Try to UF as tolerated. 4. Anemia: Hgb 8 - last Aranesp on 2/1 per notes, not due for next dose yet. 5. Secondary hyperparathyroidism: VDRA recently d/c'd d/t hypercalcemia, Ca/Phos ok. 6. Nutrition: Alb low, continue supplements.    Veneta Penton, PA-C 04/09/2021, 10:01 AM  [employer]

## 2021-04-09 NOTE — Progress Notes (Signed)
PROGRESS NOTE        PATIENT DETAILS Name: Elijah White Age: 81 y.o. Sex: male Date of Birth: 1940-05-30 Admit Date: 04/07/2021 Admitting Physician Orma Flaming, MD EPP:IRJJOA, Beatriz Chancellor, MD  Brief Summary: Patient is a 81 y.o.  male with history of ESRD on HD MWF, PAD-s/p left BKA, HTN, DM-2-with 2 recent hospitalizations for acute cholecystitis requiring cholecystostomy tube placement-referred to the ED from HD center for hypotension.  See below for further details  Significant Hospital events: 1/11-1/22>> hospitalized for sepsis due to acute cholecystitis-underwent cholecystostomy drain placement on 1/12-drain pulled out on 1/17-monitored-stabilized on abx and discharged to SNF without cholecystostomy drain 1/23-2/1>> readmitted for severe abdominal pain-cholecystostomy tube placed on 1/24-discharge back to SNF 2/4>> readmitted for hypotension/RUQ pain  Significant imaging studies: 2/3>> CT abdomen/pelvis: Per cholecystostomy in place-but persistent distention of the gallbladder, progressive anasarca, 2.5 cm left adrenal nodule  Significant microbiology data: 2/3>> COVID/flu PCR: Negative 2/3>> blood cultures: Pending  Procedures: None  Consults:  PCCM, Renal, IR  Subjective: Mild RUQ pain-otherwise lying comfortably in bed.  BP fluctuating but stable.  Son at bedside.  Objective: Vitals: Blood pressure 128/72, pulse 78, temperature 98.4 F (36.9 C), temperature source Oral, resp. rate 16, SpO2 99 %.   Exam: Gen Exam:Alert awake-not in any distress HEENT:atraumatic, normocephalic Chest: B/L clear to auscultation anteriorly CVS:S1S2 regular Abdomen: Soft-mild RUQ pain without any tenderness. Extremities:no edema-left AKA Neurology: Non focal Skin: no rash   Pertinent Labs/Radiology: CBC Latest Ref Rng & Units 04/09/2021 04/08/2021 04/07/2021  WBC 4.0 - 10.5 K/uL 12.7(H) 13.7(H) 16.2(H)  Hemoglobin 13.0 - 17.0 g/dL 8.0(L) 7.9(L) 9.2(L)   Hematocrit 39.0 - 52.0 % 24.4(L) 24.5(L) 27.3(L)  Platelets 150 - 400 K/uL 373 398 411(H)    Lab Results  Component Value Date   NA 135 04/09/2021   K 3.6 04/09/2021   CL 100 04/09/2021   CO2 20 (L) 04/09/2021      Assessment/Plan: * Hypotension- (present on admission) Suspect multifactorial etiology-hypovolemia-smoldering sepsis from ongoing cholecystitis, fluid shift with hemodialysis and autonomic dysfunction associated with Parkinson's disease.  BP soft but stable-continue midodrine.  Remains on broad-spectrum antibiotics for now.  s.  Acute cholecystitis- (present on admission) Continue broad-spectrum antibiotics-Per IR-[facility] cholecystostomy drain functioning well.   Pneumonia during recent hospitalization - (present on admission) Suspect sympathetic/PNA from neighboring cholecystitis-already treated during prior hospitalization-in any event-already on broad-spectrum antimicrobial therapy.  Anemia in chronic kidney disease- (present on admission) No evidence of blood loss-defer Aranesp/iron to nephrology.   ESRD (end stage renal disease) ([place])- (present on admission) Nephrology following for HD.   Acute urinary retention with foley - (present on admission) At hospitalization on 4/16 he had complicated UTI, urinary retention and hydronephrosis requiring foley placement. Recommended f/u with urology outpatient, which he has not seen yet.  Per family-they would want to proceed with voiding trial as he hardly makes any urine.  DC Foley today and see how he does.    Paroxysmal atrial fibrillation (HCC)- (present on admission) Maintaining sinus rhythm-not on anticoagulation due to severity of anemia/concern for GI bleeding and advanced age.  EGD last month was stable.  Resume metoprolol when BP permits  Severe aortic stenosis s/p TAVR  Stable. Echo 03/2021 EF of 60-65% with TAVR with normal function/placement   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- (present on  admission) Continue Plavix  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)- (present on admission) CBG stable with SSI.   Recent Labs    04/08/21 2047 04/09/21 0830 04/09/21 1201  GLUCAP 111* 103* 99    GERD (gastroesophageal reflux disease)/esophageal candidiasis - (present on admission) Continue protonix.  S/p EGD 03/2021 during hospitalization showed chronic gastritis-suspicion for Candida-completed therapy with Diflucan  Blindness/glaucoma  Continue with eye drops.  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  Parkinson's disease ([facility])- (present on admission) Followed by neurology in the outpatient setting-continue Sinemet-since at risk for aspiration-on dysphagia 2 diet  2.5 cm left adrenal nodule- (present on admission) Radiology recommending dedicated CT/MRI examination once acute issues have resolved-we will defer to patient's primary care practitioner.  Pressure injury of skin- (present on admission) Pressure Injury 03/28/21 Coccyx Medial Stage 2 -  Partial thickness loss of dermis presenting as a shallow open injury with a red, pink wound bed without slough. wound bed is pink, no drainage noted (Active)  03/28/21 1028  Location: Coccyx  Location Orientation: Medial  Staging: Stage 2 -  Partial thickness loss of dermis presenting as a shallow open injury with a red, pink wound bed without slough.  Wound Description (Comments): wound bed is pink, no drainage noted  Present on Admission: Yes       Sepsis (HCC)-resolved as of 04/07/2021, (present on admission) 81 year old presenting from dialysis with hypotension and right sided chest pain found to meet sepsis criteria with elevated WBC and hypotension   BMI: Estimated body mass index is 24.29 kg/m as calculated from the following:   Height as of 03/28/21: 5\' 5"  (1.651 m).   Weight as of 04/05/21: 66.2 kg.   Code status:   Code Status: DNR   DVT Prophylaxis: heparin injection 5,000 Units Start:  04/07/21 2200   Family Communication: Daughter-[name] Foster-[phone] updated on 2/5-son was updated at bedside as well.   Disposition Plan: Status is: Inpatient Remains inpatient appropriate because: Hypotension-smoldering cholecystitis-possible sepsis-on IV antibiotics-not yet stable for discharge.   Planned Discharge Destination:Skilled nursing facility   Diet: Diet Order             Diet renal with fluid restriction Fluid restriction: 1200 mL Fluid; Room service appropriate? Yes; Fluid consistency: Thin  Diet effective now                     Antimicrobial agents: Anti-infectives (From admission, onward)    Start     Dose/Rate Route Frequency Ordered Stop   04/10/21 1200  vancomycin (VANCOREADY) IVPB 750 mg/150 mL        750 mg 150 mL/hr over 60 Minutes Intravenous Every M-W-F (Hemodialysis) 04/07/21 1431     04/08/21 1600  ceFEPIme (MAXIPIME) 1 g in sodium chloride 0.9 % 100 mL IVPB        1 g 200 mL/hr over 30 Minutes Intravenous Every 24 hours 04/07/21 1430     04/08/21 1530  fluconazole (DIFLUCAN) tablet 100 mg        100 mg Oral Daily 04/08/21 1431 04/08/21 1536   04/07/21 2000  metroNIDAZOLE (FLAGYL) IVPB 500 mg        500 mg 100 mL/hr over 60 Minutes Intravenous Every 12 hours 04/07/21 1830     04/07/21 1430  ceFEPIme (MAXIPIME) 2 g in sodium chloride 0.9 % 100 mL IVPB        2 g 200 mL/hr over 30 Minutes Intravenous  Once 04/07/21 1429 04/07/21 1658  04/07/21 1430  vancomycin (VANCOREADY) IVPB 1500 mg/300 mL        1,500 mg 150 mL/hr over 120 Minutes Intravenous  Once 04/07/21 1429 04/07/21 2052        MEDICATIONS: Scheduled Meds:  (feeding supplement) PROSource Plus  30 mL Oral BID BM   atorvastatin  10 mg Oral q AM   brimonidine  1 drop Right Eye TID   carbidopa-levodopa  1 tablet Oral TID   Chlorhexidine Gluconate Cloth  6 each Topical Q0600   [START ON 04/10/2021] cinacalcet  60 mg Oral Q M,W,F-HD   [START ON 04/10/2021] clopidogrel  75 mg  Oral Q breakfast   docusate sodium  100 mg Oral BID   dorzolamide-timolol  1 drop Right Eye BID   ferric citrate  420 mg Oral TID WC   gabapentin  100 mg Oral Daily   heparin  5,000 Units Subcutaneous Q8H   insulin aspart  0-6 Units Subcutaneous TID WC   melatonin  5 mg Oral QHS   midodrine  10 mg Oral TID WC   multivitamin  1 tablet Oral QHS   Netarsudil-Latanoprost  1 drop Right Eye QHS   nystatin  5 mL Oral QID   pantoprazole  40 mg Oral Q1200   pilocarpine  1 drop Right Eye QID   senna-docusate  1 tablet Oral QHS   sevelamer carbonate  1,600 mg Oral TID WC   sodium chloride flush  3 mL Intravenous Q12H   Continuous Infusions:  sodium chloride     ceFEPime (MAXIPIME) IV 1 g (04/08/21 1545)   metronidazole 500 mg (04/09/21 1202)   [START ON 04/10/2021] vancomycin     PRN Meds:.sodium chloride, acetaminophen **OR** acetaminophen, hydrOXYzine, hypromellose, ondansetron **OR** ondansetron (ZOFRAN) IV, oxyCODONE, sodium chloride flush   I have personally reviewed following labs and imaging studies  LABORATORY DATA: CBC: Recent Labs  Lab 04/03/21 0249 04/04/21 0334 04/05/21 0723 04/07/21 1251 04/08/21 0133 04/09/21 0209  WBC 18.7* 15.5* 13.2* 16.2* 13.7* 12.7*  NEUTROABS 15.9* 13.5*  --   --   --   --   HGB 8.4* 8.4* 8.6* 9.2* 7.9* 8.0*  HCT 25.3* 25.8* 25.0* 27.3* 24.5* 24.4*  MCV 102.8* 102.8* 101.6* 103.8* 103.8* 103.4*  PLT 442* 489* 474* 411* 398 601    Basic Metabolic Panel: Recent Labs  Lab 04/04/21 0334 04/05/21 0723 04/07/21 1251 04/08/21 0133 04/09/21 0209  NA 137 136 132* 135 135  K 3.9 3.6 3.7 3.7 3.6  CL 101 103 100 102 100  CO2 23 22 20* 20* 20*  GLUCOSE 115* 107* 176* 139* 128*  BUN 21 27* 28* 29* 38*  CREATININE 5.24* 6.37* 6.00* 6.39* 7.09*  CALCIUM 8.6* 8.8* 8.7* 8.6* 8.5*  PHOS  --  4.3  --   --  3.9    GFR: Estimated Creatinine Clearance: 7 mL/min (A) (by C-G formula based on SCr of 7.09 mg/dL (H)).  Liver Function Tests: Recent  Labs  Lab 04/05/21 0723 04/08/21 0133 04/09/21 0209  AST  --  13*  --   ALT  --  <5  --   ALKPHOS  --  152*  --   BILITOT  --  0.5  --   PROT  --  5.1*  --   ALBUMIN 1.6* 1.7* 1.6*   No results for input(s): LIPASE, AMYLASE in the last 168 hours. No results for input(s): AMMONIA in the last 168 hours.  Coagulation Profile: Recent Labs  Lab 04/07/21 1251  INR  0.9    Cardiac Enzymes: No results for input(s): CKTOTAL, CKMB, CKMBINDEX, TROPONINI in the last 168 hours.  BNP (last 3 results) No results for input(s): PROBNP in the last 8760 hours.  Lipid Profile: No results for input(s): CHOL, HDL, LDLCALC, TRIG, CHOLHDL, LDLDIRECT in the last 72 hours.  Thyroid Function Tests: No results for input(s): TSH, T4TOTAL, FREET4, T3FREE, THYROIDAB in the last 72 hours.  Anemia Panel: No results for input(s): VITAMINB12, FOLATE, FERRITIN, TIBC, IRON, RETICCTPCT in the last 72 hours.  Urine analysis:    Component Value Date/Time   COLORURINE YELLOW 03/27/2021 1130   APPEARANCEUR HAZY (A) 03/27/2021 1130   LABSPEC 1.008 03/27/2021 1130   PHURINE 8.0 03/27/2021 1130   GLUCOSEU 50 (A) 03/27/2021 1130   HGBUR MODERATE (A) 03/27/2021 1130   BILIRUBINUR NEGATIVE 03/27/2021 1130   BILIRUBINUR negative 02/19/2018 1643   KETONESUR NEGATIVE 03/27/2021 1130   PROTEINUR 100 (A) 03/27/2021 1130   UROBILINOGEN 0.2 02/19/2018 1643   UROBILINOGEN 1.0 08/22/2012 1402   NITRITE NEGATIVE 03/27/2021 1130   LEUKOCYTESUR LARGE (A) 03/27/2021 1130    Sepsis Labs: Lactic Acid, Venous    Component Value Date/Time   LATICACIDVEN 1.8 04/07/2021 2237    MICROBIOLOGY: Recent Results (from the past 240 hour(s))  Body fluid culture w Gram Stain     Status: None   Collection Time: 04/01/21  1:37 PM   Specimen: Lung, Right Lower Lobe; Pleural Fluid  Result Value Ref Range Status   Specimen Description PLEURAL FLUID  Final   Special Requests LUNG RIGHT LOWER LOBE  Final   Gram Stain   Final     RARE WBC PRESENT,BOTH PMN AND MONONUCLEAR NO ORGANISMS SEEN    Culture   Final    NO GROWTH Performed at [facility], [place] [address] [place], [place] [address]    Report Status 04/04/2021 FINAL  Final  Culture, blood (routine x 2)     Status: None   Collection Time: 04/02/21  9:57 AM   Specimen: BLOOD RIGHT WRIST  Result Value Ref Range Status   Specimen Description BLOOD RIGHT WRIST  Final   Special Requests   Final    BOTTLES DRAWN AEROBIC AND ANAEROBIC Blood Culture results may not be optimal due to an inadequate volume of blood received in culture bottles   Culture   Final    NO GROWTH 5 DAYS Performed at [facility], [place] [address] [place], [place] [address]    Report Status 04/07/2021 FINAL  Final  Culture, blood (routine x 2)     Status: None   Collection Time: 04/02/21  9:57 AM   Specimen: BLOOD RIGHT HAND  Result Value Ref Range Status   Specimen Description BLOOD RIGHT HAND  Final   Special Requests   Final    BOTTLES DRAWN AEROBIC AND ANAEROBIC Blood Culture results may not be optimal due to an inadequate volume of blood received in culture bottles   Culture   Final    NO GROWTH 5 DAYS Performed at [facility], [place] [address] [place], [place] [address]    Report Status 04/07/2021 FINAL  Final  MRSA Next Gen by PCR, Nasal     Status: None   Collection Time: 04/03/21  9:50 AM   Specimen: Nasal Mucosa; Nasal Swab  Result Value Ref Range Status   MRSA by PCR Next Gen NOT DETECTED NOT DETECTED Final    Comment: (NOTE) The GeneXpert MRSA Assay (FDA approved  for NASAL specimens only), is one component of a comprehensive MRSA colonization surveillance program. It is not intended to diagnose MRSA infection nor to guide or monitor treatment for MRSA infections. Test performance is not FDA approved in patients less than 59 years old. Performed at [facility], [place] [address] [place], [place] [address]   Resp Panel by RT-PCR (Flu A&B, Covid)  Nasopharyngeal Swab     Status: None   Collection Time: 04/05/21  9:59 AM   Specimen: Nasopharyngeal Swab; Nasopharyngeal(NP) swabs in vial transport medium  Result Value Ref Range Status   SARS Coronavirus 2 by RT PCR NEGATIVE NEGATIVE Final    Comment: (NOTE) SARS-CoV-2 target nucleic acids are NOT DETECTED.  The SARS-CoV-2 RNA is generally detectable in upper respiratory specimens during the acute phase of infection. The lowest concentration of SARS-CoV-2 viral copies this assay can detect is 138 copies/mL. A negative result does not preclude SARS-Cov-2 infection and should not be used as the sole basis for treatment or other patient management decisions. A negative result may occur with  improper specimen collection/handling, submission of specimen other than nasopharyngeal swab, presence of viral mutation(s) within the areas targeted by this assay, and inadequate number of viral copies(<138 copies/mL). A negative result must be combined with clinical observations, patient history, and epidemiological information. The expected result is Negative.  Fact Sheet for Patients:  EntrepreneurPulse.com.au  Fact Sheet for Healthcare Providers:  IncredibleEmployment.be  This test is no t yet approved or cleared by the [place] FDA and  has been authorized for detection and/or diagnosis of SARS-CoV-2 by FDA under an Emergency Use Authorization (EUA). This EUA will remain  in effect (meaning this test can be used) for the duration of the COVID-19 declaration under Section 564(b)(1) of the Act, 21 U.S.C.section 360bbb-3(b)(1), unless the authorization is terminated  or revoked sooner.       Influenza A by PCR NEGATIVE NEGATIVE Final   Influenza B by PCR NEGATIVE NEGATIVE Final    Comment: (NOTE) The Xpert Xpress SARS-CoV-2/FLU/RSV plus assay is intended as an aid in the diagnosis of influenza from Nasopharyngeal swab specimens and should not be  used as a sole basis for treatment. Nasal washings and aspirates are unacceptable for Xpert Xpress SARS-CoV-2/FLU/RSV testing.  Fact Sheet for Patients: EntrepreneurPulse.com.au  Fact Sheet for Healthcare Providers: IncredibleEmployment.be  This test is not yet approved or cleared by the [place] FDA and has been authorized for detection and/or diagnosis of SARS-CoV-2 by FDA under an Emergency Use Authorization (EUA). This EUA will remain in effect (meaning this test can be used) for the duration of the COVID-19 declaration under Section 564(b)(1) of the Act, 21 U.S.C. section 360bbb-3(b)(1), unless the authorization is terminated or revoked.  Performed at [facility], [place] [address], [place], [place] [address]   MRSA Next Gen by PCR, Nasal     Status: None   Collection Time: 04/07/21  2:30 PM   Specimen: Nasopharyngeal Swab; Nasal Swab  Result Value Ref Range Status   MRSA by PCR Next Gen NOT DETECTED NOT DETECTED Final    Comment: (NOTE) The GeneXpert MRSA Assay (FDA approved for NASAL specimens only), is one component of a comprehensive MRSA colonization surveillance program. It is not intended to diagnose MRSA infection nor to guide or monitor treatment for MRSA infections. Test performance is not FDA approved in patients less than 68 years old. Performed at [facility], [place] [address] [place], [place] [address]  Resp Panel by RT-PCR (Flu A&B, Covid) Nasopharyngeal Swab     Status: None   Collection Time: 04/07/21  3:02 PM   Specimen: Nasopharyngeal Swab; Nasopharyngeal(NP) swabs in vial transport medium  Result Value Ref Range Status   SARS Coronavirus 2 by RT PCR NEGATIVE NEGATIVE Final    Comment: (NOTE) SARS-CoV-2 target nucleic acids are NOT DETECTED.  The SARS-CoV-2 RNA is generally detectable in upper respiratory specimens during the acute phase of infection. The lowest concentration of SARS-CoV-2 viral  copies this assay can detect is 138 copies/mL. A negative result does not preclude SARS-Cov-2 infection and should not be used as the sole basis for treatment or other patient management decisions. A negative result may occur with  improper specimen collection/handling, submission of specimen other than nasopharyngeal swab, presence of viral mutation(s) within the areas targeted by this assay, and inadequate number of viral copies(<138 copies/mL). A negative result must be combined with clinical observations, patient history, and epidemiological information. The expected result is Negative.  Fact Sheet for Patients:  EntrepreneurPulse.com.au  Fact Sheet for Healthcare Providers:  IncredibleEmployment.be  This test is no t yet approved or cleared by the [place] FDA and  has been authorized for detection and/or diagnosis of SARS-CoV-2 by FDA under an Emergency Use Authorization (EUA). This EUA will remain  in effect (meaning this test can be used) for the duration of the COVID-19 declaration under Section 564(b)(1) of the Act, 21 U.S.C.section 360bbb-3(b)(1), unless the authorization is terminated  or revoked sooner.       Influenza A by PCR NEGATIVE NEGATIVE Final   Influenza B by PCR NEGATIVE NEGATIVE Final    Comment: (NOTE) The Xpert Xpress SARS-CoV-2/FLU/RSV plus assay is intended as an aid in the diagnosis of influenza from Nasopharyngeal swab specimens and should not be used as a sole basis for treatment. Nasal washings and aspirates are unacceptable for Xpert Xpress SARS-CoV-2/FLU/RSV testing.  Fact Sheet for Patients: EntrepreneurPulse.com.au  Fact Sheet for Healthcare Providers: IncredibleEmployment.be  This test is not yet approved or cleared by the [place] FDA and has been authorized for detection and/or diagnosis of SARS-CoV-2 by FDA under an Emergency Use Authorization (EUA). This  EUA will remain in effect (meaning this test can be used) for the duration of the COVID-19 declaration under Section 564(b)(1) of the Act, 21 U.S.C. section 360bbb-3(b)(1), unless the authorization is terminated or revoked.  Performed at [facility], [place] [address] [place], [place] [address]   Culture, blood (routine x 2)     Status: None (Preliminary result)   Collection Time: 04/08/21  1:33 AM   Specimen: BLOOD  Result Value Ref Range Status   Specimen Description BLOOD RIGHT ANTECUBITAL  Final   Special Requests AEROBIC BOTTLE ONLY Blood Culture adequate volume  Final   Culture   Final    NO GROWTH 1 DAY Performed at [facility], [place] [address] [place], [place] [address]    Report Status PENDING  Incomplete  Culture, blood (routine x 2)     Status: None (Preliminary result)   Collection Time: 04/08/21  1:33 AM   Specimen: BLOOD RIGHT HAND  Result Value Ref Range Status   Specimen Description BLOOD RIGHT HAND  Final   Special Requests AEROBIC BOTTLE ONLY Blood Culture adequate volume  Final   Culture   Final    NO GROWTH 1 DAY Performed at [facility], [place] [address], [place],  [address]    Report  Status PENDING  Incomplete    RADIOLOGY STUDIES/RESULTS: CT ABDOMEN PELVIS W CONTRAST  Result Date: 04/07/2021 CLINICAL DATA:  Abdominal pain, acute, nonlocalized. EXAM: CT ABDOMEN AND PELVIS WITH CONTRAST TECHNIQUE: Multidetector CT imaging of the abdomen and pelvis was performed using the standard protocol following bolus administration of intravenous contrast. RADIATION DOSE REDUCTION: This exam was performed according to the departmental dose-optimization program which includes automated exposure control, adjustment of the mA and/or kV according to patient size and/or use of iterative reconstruction technique. CONTRAST:  168mL OMNIPAQUE IOHEXOL 350 MG/ML SOLN COMPARISON:  04/02/2021 FINDINGS: Lower chest: Moderate right pleural effusion has slightly  increased in size with progressive, compressive atelectasis of the right lower lobe. Tiny left pleural effusion with minimal left basilar compressive atelectasis. Extensive multi-vessel coronary artery calcification again noted. 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 Cardiac size within normal limits. Hepatobiliary: Percutaneous cholecystostomy is unchanged with its retaining loop seen within the gallbladder lumen. The gallbladder, however, remains distended suggesting inadequate drainage of gallbladder contents and/or occlusion of the catheter. Previously noted hyperdensity related to hemorrhagic intraluminal contents is not as well appreciated on this examination possibly related to partial lysis of a blood product. Layering gallstones are again identified within the gallbladder lumen. Liver otherwise unremarkable. No intra or extrahepatic biliary ductal dilation. Pancreas: Unremarkable Spleen: Unremarkable Adrenals/Urinary Tract: 2.1 x 2.5 cm heterogeneously enhancing left adrenal nodule is again identified and appears stable since remote prior examination of 11/26/2019, though is indeterminate. The right adrenal gland is unremarkable. Moderate bilateral renal cortical atrophy again noted. No enhancing intrarenal masses are identified. No hydronephrosis. No intrarenal or ureteral calculi. Vascular calcifications are noted within the renal hila bilaterally. The bladder is decompressed with a Foley catheter balloon seen within its lumen. Stomach/Bowel: Stomach is within normal limits. Appendix appears normal. No evidence of bowel wall thickening, distention, or inflammatory changes. Vascular/Lymphatic: Extensive aortoiliac atherosclerotic calcification with probable hemodynamically significant stenoses involving the lower extremity arterial inflow bilaterally. This is unchanged and not optimally assessed on this non arteriographic examination. No aortic aneurysm. No pathologic adenopathy within  the abdomen and pelvis. Reproductive: Brachytherapy seeds are seen within the prostate gland Other: There is progressive moderate diffuse subcutaneous body wall edema, particularly within the right flank which may relate to dependent positioning or superimposed local trauma or inflammation. Small fat containing left inguinal hernia. Rectum unremarkable. Musculoskeletal: No acute bone abnormality. No lytic or blastic bone lesion. IMPRESSION: Stable examination with percutaneous cholecystostomy in place but persistent distension of the gallbladder suggesting inadequate drainage of complex intraluminal contents and/or catheter occlusion. Evolution of intraluminal blood product within the gallbladder lumen noted. Layering gallstones again identified. Progressive anasarca with enlarging pleural effusions and progressive diffuse body wall subcutaneous edema. Asymmetric right flank subcutaneous body wall edema which may relate to dependent positioning or superimposed local trauma or inflammation. Correlation with clinical examination is recommended. 2.5 cm heterogeneously enhancing left adrenal nodule. While stable since remote prior examination of 11/26/2019, a malignant lesion such as a pheochromocytoma, is not excluded and dedicated adrenal mass protocol CT or MRI examination is recommended once the patient's acute issues have resolved. Peripheral vascular disease with suspected bilateral lower extremity arterial inflow stenoses. This would be better assessed with CT arteriography if there is a clinical history of a lower extremity claudication or arterial vascular insufficiency. Aortic Atherosclerosis (ICD10-I70.0). Electronically Signed   By: Fidela Salisbury M.D.   On: 04/07/2021 19:22   DG Chest Port 1 View  Result Date: 04/07/2021 CLINICAL DATA:  Chest pain, cholecystostomy EXAM: PORTABLE CHEST 1 VIEW COMPARISON:  04/02/2021 chest radiograph. 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 Surgical clips overlie the  lower right heart and left axilla. Partially visualized pigtail cholecystostomy tube in the right upper quadrant of the abdomen. Stable cardiomediastinal silhouette with borderline mild cardiomegaly. No pneumothorax. Slight blunting of the right costophrenic angle, unchanged, cannot exclude small right pleural effusion. No left pleural effusion. No overt pulmonary edema. Slightly low lung volumes. Patchy right lung base opacity, slightly improved. IMPRESSION: 1. Stable borderline mild cardiomegaly without overt pulmonary edema. 2. Possible small right pleural effusion, unchanged. 3. Slightly improved patchy right lung base opacity. Electronically Signed   By: Ilona Sorrel M.D.   On: 04/07/2021 13:08     LOS: 2 days   Oren Binet, MD  [facility]    To contact the attending provider between 7A-7P or the covering provider during after hours 7P-7A, please log into the web site [email or web address] and access using universal [place] password for that web site. If you do not have the password, please call the hospital operator.  04/09/2021, 12:30 PM

## 2021-04-10 LAB — COMPREHENSIVE METABOLIC PANEL
ALT: 5 U/L (ref 0–44)
AST: 12 U/L — ABNORMAL LOW (ref 15–41)
Albumin: 1.6 g/dL — ABNORMAL LOW (ref 3.5–5.0)
Alkaline Phosphatase: 120 U/L (ref 38–126)
Anion gap: 15 (ref 5–15)
BUN: 43 mg/dL — ABNORMAL HIGH (ref 8–23)
CO2: 18 mmol/L — ABNORMAL LOW (ref 22–32)
Calcium: 8.7 mg/dL — ABNORMAL LOW (ref 8.9–10.3)
Chloride: 99 mmol/L (ref 98–111)
Creatinine, Ser: 8.13 mg/dL — ABNORMAL HIGH (ref 0.61–1.24)
GFR, Estimated: 6 mL/min — ABNORMAL LOW (ref >60)
Glucose, Bld: 98 mg/dL (ref 70–99)
Potassium: 3.6 mmol/L (ref 3.5–5.1)
Sodium: 132 mmol/L — ABNORMAL LOW (ref 135–145)
Total Bilirubin: 0.6 mg/dL (ref 0.3–1.2)
Total Protein: 5.1 g/dL — ABNORMAL LOW (ref 6.5–8.1)

## 2021-04-10 LAB — CBC
HCT: 23.7 % — ABNORMAL LOW (ref 39.0–52.0)
Hemoglobin: 8 g/dL — ABNORMAL LOW (ref 13.0–17.0)
MCH: 34.6 pg — ABNORMAL HIGH (ref 26.0–34.0)
MCHC: 33.8 g/dL (ref 30.0–36.0)
MCV: 102.6 fL — ABNORMAL HIGH (ref 80.0–100.0)
Platelets: 363 10*3/uL (ref 150–400)
RBC: 2.31 MIL/uL — ABNORMAL LOW (ref 4.22–5.81)
RDW: 16.6 % — ABNORMAL HIGH (ref 11.5–15.5)
WBC: 13.5 10*3/uL — ABNORMAL HIGH (ref 4.0–10.5)
nRBC: 0.1 % (ref 0.0–0.2)

## 2021-04-10 LAB — GLUCOSE, CAPILLARY
Glucose-Capillary: 82 mg/dL (ref 70–99)
Glucose-Capillary: 66 mg/dL — ABNORMAL LOW (ref 70–99)
Glucose-Capillary: 115 mg/dL — ABNORMAL HIGH (ref 70–99)
Glucose-Capillary: 100 mg/dL — ABNORMAL HIGH (ref 70–99)
Glucose-Capillary: 84 mg/dL (ref 70–99)

## 2021-04-10 MED ORDER — ALBUMIN HUMAN 25 % IV SOLN
INTRAVENOUS | Status: AC
  Administered 2021-04-10: 12.5 g
  Filled 2021-04-10: qty 100

## 2021-04-10 MED ORDER — DEXTROSE 50 % IV SOLN
INTRAVENOUS | Status: AC
  Administered 2021-04-10: 25 mL
  Filled 2021-04-10: qty 50

## 2021-04-10 MED ORDER — SODIUM CHLORIDE 0.9 % IV SOLN: 2.0000 g | INTRAVENOUS | Status: DC

## 2021-04-10 MED ORDER — DEXTROSE 50 % IV SOLN
12.5000 g | INTRAVENOUS | Status: AC
  Filled 2021-04-10: qty 50

## 2021-04-10 MED ORDER — METRONIDAZOLE 500 MG PO TABS
500.0000 mg | ORAL_TABLET | Freq: Two times a day (BID) | ORAL | Status: AC
  Administered 2021-04-10 – 2021-04-11 (×2): 500 mg via ORAL
  Filled 2021-04-10 (×3): qty 1

## 2021-04-10 NOTE — Progress Notes (Signed)
Hypoglycemic Event  CBG: 66  Treatment: D50 25 mL (12.5 gm)  Symptoms: None  Follow-up CBG: Time:1254 CBG Result:115  Possible Reasons for Event: Inadequate meal intake  Comments/MD notified: [email or web address]    Peggye Form

## 2021-04-10 NOTE — Progress Notes (Signed)
[facility] Progress Note   Subjective:   Patient seen and examined at bedside.  Awakens to repeat verbal stimuli and returns to sleep after answer a question.  Per nurse report overnight patient has been lethargic since receiving oxycodone and hydroxyzine.  Denies CP, SOB, abdominal pain, and n/v/d.   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   Liver Function Tests: Recent Labs  Lab 04/08/21 0133 04/09/21 0209 04/10/21 0257  AST 13*  --  12*  ALT <5  --  5  ALKPHOS 152*  --  120  BILITOT 0.5  --  0.6  PROT 5.1*  --  5.1*  ALBUMIN 1.7* 1.6* 1.6*   CBC: Recent Labs  Lab 04/04/21 0334 04/05/21 0723 04/07/21 1251 04/08/21 0133 04/09/21 0209 04/10/21 0257  WBC 15.5* 13.2* 16.2* 13.7* 12.7* 13.5*  NEUTROABS 13.5*  --   --   --   --   --   HGB 8.4* 8.6* 9.2* 7.9* 8.0* 8.0*  HCT 25.8* 25.0* 27.3* 24.5* 24.4* 23.7*  MCV 102.8* 101.6* 103.8* 103.8* 103.4* 102.6*  PLT 489* 474* 411* 398 373 363   Blood Culture     Component Value Date/Time   SDES BLOOD RIGHT ANTECUBITAL 04/08/2021 0133   SDES BLOOD RIGHT HAND 04/08/2021 0133   SPECREQUEST AEROBIC BOTTLE ONLY Blood Culture adequate volume 04/08/2021 0133   SPECREQUEST AEROBIC BOTTLE ONLY Blood Culture adequate volume 04/08/2021 0133   CULT  04/08/2021 0133    NO GROWTH 2 DAYS Performed at [facility], [place] [address], [place], [place] [address]    CULT  04/08/2021 0133    NO GROWTH 2 DAYS Performed at [place] [address] [place],  [address]    REPTSTATUS PENDING 04/08/2021 0133   REPTSTATUS PENDING 04/08/2021 0133    CBG: Recent Labs  Lab 04/09/21 0830 04/09/21 1201 04/09/21 1653 04/09/21 2105 04/10/21 0800  GLUCAP 103* 99 89 92 82   Medications:  sodium chloride     ceFEPime (MAXIPIME) IV 1 g (04/09/21 1522)   metronidazole 500 mg (04/09/21 2009)    (feeding supplement) PROSource Plus  30 mL Oral BID BM   atorvastatin  10 mg Oral q AM  brimonidine  1 drop Right Eye TID   carbidopa-levodopa  1 tablet Oral TID   Chlorhexidine Gluconate Cloth  6 each Topical Q0600   cinacalcet  60 mg Oral Q M,W,F-HD   clopidogrel  75 mg Oral Q breakfast   docusate sodium  100 mg Oral BID   dorzolamide-timolol  1 drop Right Eye BID   ferric citrate  420 mg Oral TID WC   gabapentin  100 mg Oral Daily   heparin  5,000 Units Subcutaneous Q8H   insulin aspart  0-6 Units Subcutaneous TID WC   latanoprost  1 drop Right Eye QHS   melatonin  5 mg Oral QHS   midodrine  10 mg Oral TID WC   multivitamin  1 tablet Oral QHS   nystatin  5 mL Oral QID   pantoprazole  40 mg Oral Q1200   pilocarpine  1 drop Right Eye QID   senna-docusate  1 tablet Oral QHS   sevelamer carbonate  1,600 mg Oral TID WC   sodium chloride flush  3 mL Intravenous Q12H    Dialysis Orders: [place] MWF  4h  400/600  66kg 2/2 bath  P2  AVF  Hep none  - Sensipar 60 mg PO q HD  - Mircera 31mcg Iv q 2 weeks   Assessment/Plan: 1. Hypotension: On  admit. Does not appear septic.  BC NGTD. Given fluid bolus and started on midodrine 10mg  TID with improvement. 2. Acute cholecystitis: S/p perc chole tube 1/12-1/27, then replaced 1/24 - current. On broad spectrum antibiotics. Per PMD. 3. ESRD: Usual MWF schedule - Next HD today. 3. Volume: Does not appear volume overloaded despite worsening anasarca and pleural effusion noted on CT. Hx R thoracentesis 1/28 with 1.1L removed. BP limiting UF, likely weight loss with repeat admission, continue to titrate down volume as tolerated.  LLower dry weight as able.  No weights noted during admission.  4. Anemia: last Hgb 8 - last Aranesp on 2/1 per notes, next dose due to on 04/12/21. 5. Secondary hyperparathyroidism: VDRA recently d/c'd d/t hypercalcemia, Ca/Phos ok. 6. Nutrition: Alb low, continue supplements.   7. Parkinson's disease - on dysphagia 2 diet.  8. Blindness/glaucoma 9. 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  Needs CT/MRI to eval once acute issues resolve 10. Severe aortic stenosis s/p TAVR, PAF -stable. Not on anticoagulation 11. Acute urinary retention w/foley - no outpatient F?u with urology yet.  Plan for voiding trial.  Jen Mow, PA-C [facility] 04/10/2021,11:56 AM  LOS: 3 days

## 2021-04-10 NOTE — Progress Notes (Signed)
°  Transition of Care [facility]) Screening Note   Patient Details  Name: Elijah White Date of Birth: Dec 28, 1940   Transition of Care [facility]) CM/SW Contact:    Cyndi Bender, RN Phone Number: 04/10/2021, 8:34 AM    Transition of Care Department [facility]) has reviewed patient and no TOC needs have been identified at this time. We will continue to monitor patient advancement through interdisciplinary progression rounds. If new patient transition needs arise, please place a TOC consult.

## 2021-04-10 NOTE — Progress Notes (Signed)
PROGRESS NOTE        PATIENT DETAILS Name: Elijah White Age: 81 y.o. Sex: male Date of Birth: [birth year] Admit Date: 04/07/2021 Admitting Physician Orma Flaming, MD [id number], Beatriz Chancellor, MD  Brief Summary: Patient is a 81 y.o.  male with history of ESRD on HD MWF, PAD-s/p left BKA, HTN, DM-2-with 2 recent hospitalizations for acute cholecystitis requiring cholecystostomy tube placement-referred to the ED from HD center for hypotension.  See below for further details  Significant Hospital events: 1/11-1/22>> hospitalized for sepsis due to acute cholecystitis-underwent cholecystostomy drain placement on 1/12-drain pulled out on 1/17-monitored-stabilized on abx and discharged to SNF without cholecystostomy drain 1/23-2/1>> readmitted for severe abdominal pain-cholecystostomy tube placed on 1/24-discharge back to SNF 2/4>> readmitted for hypotension/RUQ pain  Significant imaging studies: 2/3>> CT abdomen/pelvis: Per cholecystostomy in place-but persistent distention of the gallbladder, progressive anasarca, 2.5 cm left adrenal nodule  Significant microbiology data: 2/3>> COVID/flu PCR: Negative 2/3>> blood cultures: Pending  Procedures: None  Consults:  PCCM, Renal, IR  Subjective: Denies any RUQ pain today-no nausea or vomiting.  Foley catheter removed yesterday.  Objective: Vitals: Blood pressure (!) 141/64, pulse 64, temperature 97.8 F (36.6 C), temperature source Oral, resp. rate 11, SpO2 100 %.   Exam: Gen Exam: Chronically frail appearing-not in any distress. HEENT:atraumatic, normocephalic Chest: B/L clear to auscultation anteriorly CVS:S1S2 regular Abdomen:soft non tender, non distended Extremities: Left AKA  neurology: Non focal Skin: no rash   Pertinent Labs/Radiology: CBC Latest Ref Rng & Units 04/10/2021 04/09/2021 04/08/2021  WBC 4.0 - 10.5 K/uL 13.5(H) 12.7(H) 13.7(H)  Hemoglobin 13.0 - 17.0 g/dL 8.0(L) 8.0(L) 7.9(L)  Hematocrit  39.0 - 52.0 % 23.7(L) 24.4(L) 24.5(L)  Platelets 150 - 400 K/uL 363 373 398    Lab Results  Component Value Date   NA 132 (L) 04/10/2021   K 3.6 04/10/2021   CL 99 04/10/2021   CO2 18 (L) 04/10/2021      Assessment/Plan: * Hypotension- (present on admission) Suspect multifactorial etiology-hypovolemia-smoldering sepsis from ongoing cholecystitis, fluid shift with hemodialysis and autonomic dysfunction associated with Parkinson's disease.  BP soft but stable-continue midodrine.   Acute cholecystitis- (present on admission) Appreciate surgical input today-we will give 1 last dose of cefepime with HD later today-following which all antimicrobial therapy will be discontinued.  Cholecystostomy drain in place.  Abdominal exam is benign.  Pneumonia during recent hospitalization - (present on admission) Suspect sympathetic/PNA from neighboring cholecystitis-already treated during prior hospitalization-on broad-spectrum antimicrobial therapy in any event.  See above.  Anemia in chronic kidney disease- (present on admission) No evidence of blood loss-defer Aranesp/iron to nephrology.   ESRD (end stage renal disease) ([place])- (present on admission) Nephrology following for HD.   Acute urinary retention with foley - (present on admission) At hospitalization on 6/94 he had complicated UTI, urinary retention and hydronephrosis requiring foley placement.  Urology follow-up in the outpatient setting was recommended-he has not yet seen yet.  At family's request-Foley catheter was discontinued on 2/5-Per nursing staff-he is oliguric at baseline and is voiding very minimally.  Continue to watch closely.    Paroxysmal atrial fibrillation (HCC)- (present on admission) Maintaining sinus rhythm-not on anticoagulation due to severity of anemia/concern for GI bleeding and advanced age.  EGD last month was stable.  Resume metoprolol when BP permits  Severe aortic stenosis s/p TAVR  Stable. Echo 03/2021 EF  of  60-65% with TAVR with normal function/placement   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- (present on admission) Continue Plavix  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)- (present on admission) CBGs on the lower side-stop all insulin products and watch closely.  Allow permissive hyperglycemia-he is not a candidate for tight glycemic control.   Recent Labs    04/09/21 2105 04/10/21 0800 04/10/21 1211  GLUCAP 92 82 66*    GERD (gastroesophageal reflux disease)/esophageal candidiasis - (present on admission) Continue protonix.  S/p EGD 03/2021 during hospitalization showed chronic gastritis-suspicion for Candida-completed therapy with Diflucan  Blindness/glaucoma  Continue with eye drops.  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  Parkinson's disease ([place])- (present on admission) Followed by neurology in the outpatient setting-continue Sinemet  2.5 cm left adrenal nodule- (present on admission) Radiology recommending dedicated CT/MRI examination once acute issues have resolved-we will defer to patient's primary care practitioner.  Pressure injury of skin- (present on admission) Pressure Injury 03/28/21 Coccyx Medial Stage 2 -  Partial thickness loss of dermis presenting as a shallow open injury with a red, pink wound bed without slough. wound bed is pink, no drainage noted (Active)  03/28/21 1028  Location: Coccyx  Location Orientation: Medial  Staging: Stage 2 -  Partial thickness loss of dermis presenting as a shallow open injury with a red, pink wound bed without slough.  Wound Description (Comments): wound bed is pink, no drainage noted  Present on Admission: Yes       Sepsis (HCC)-resolved as of 04/07/2021, (present on admission) 81 year old presenting from dialysis with hypotension and right sided chest pain found to meet sepsis criteria with elevated WBC and hypotension   BMI: Estimated body mass index is 24.29 kg/m as calculated  from the following:   Height as of 03/28/21: 5\' 5"  (1.651 m).   Weight as of 04/05/21: 66.2 kg.   Code status:   Code Status: DNR   DVT Prophylaxis: heparin injection 5,000 Units Start: 04/07/21 2200   Family Communication: Daughter-[name] Foster-[phone] updated on 2/6   Disposition Plan: Status is: Inpatient Remains inpatient appropriate because: Hypotension-smoldering cholecystitis-possible sepsis-on IV antibiotics-not yet stable for discharge.   Planned Discharge Destination:Skilled nursing facility   Diet: Diet Order             Diet renal with fluid restriction Fluid restriction: 1200 mL Fluid; Room service appropriate? Yes; Fluid consistency: Thin  Diet effective now                     Antimicrobial agents: Anti-infectives (From admission, onward)    Start     Dose/Rate Route Frequency Ordered Stop   04/10/21 1200  vancomycin (VANCOREADY) IVPB 750 mg/150 mL  Status:  Discontinued        750 mg 150 mL/hr over 60 Minutes Intravenous Every M-W-F (Hemodialysis) 04/07/21 1431 04/10/21 1046   04/08/21 1600  ceFEPIme (MAXIPIME) 1 g in sodium chloride 0.9 % 100 mL IVPB        1 g 200 mL/hr over 30 Minutes Intravenous Every 24 hours 04/07/21 1430     04/08/21 1530  fluconazole (DIFLUCAN) tablet 100 mg        100 mg Oral Daily 04/08/21 1431 04/08/21 1536   04/07/21 2000  metroNIDAZOLE (FLAGYL) IVPB 500 mg        500 mg 100 mL/hr over 60 Minutes Intravenous Every 12 hours 04/07/21 1830     04/07/21 1430  ceFEPIme (MAXIPIME) 2  g in sodium chloride 0.9 % 100 mL IVPB        2 g 200 mL/hr over 30 Minutes Intravenous  Once 04/07/21 1429 04/07/21 1658   04/07/21 1430  vancomycin (VANCOREADY) IVPB 1500 mg/300 mL        1,500 mg 150 mL/hr over 120 Minutes Intravenous  Once 04/07/21 1429 04/07/21 2052        MEDICATIONS: Scheduled Meds:  (feeding supplement) PROSource Plus  30 mL Oral BID BM   atorvastatin  10 mg Oral q AM   brimonidine  1 drop Right Eye TID    carbidopa-levodopa  1 tablet Oral TID   Chlorhexidine Gluconate Cloth  6 each Topical Q0600   cinacalcet  60 mg Oral Q M,W,F-HD   clopidogrel  75 mg Oral Q breakfast   dextrose  12.5 g Intravenous STAT   docusate sodium  100 mg Oral BID   dorzolamide-timolol  1 drop Right Eye BID   ferric citrate  420 mg Oral TID WC   gabapentin  100 mg Oral Daily   heparin  5,000 Units Subcutaneous Q8H   latanoprost  1 drop Right Eye QHS   melatonin  5 mg Oral QHS   midodrine  10 mg Oral TID WC   multivitamin  1 tablet Oral QHS   nystatin  5 mL Oral QID   pantoprazole  40 mg Oral Q1200   pilocarpine  1 drop Right Eye QID   senna-docusate  1 tablet Oral QHS   sevelamer carbonate  1,600 mg Oral TID WC   sodium chloride flush  3 mL Intravenous Q12H   Continuous Infusions:  sodium chloride     ceFEPime (MAXIPIME) IV 1 g (04/09/21 1522)   metronidazole 500 mg (04/09/21 2009)   PRN Meds:.sodium chloride, acetaminophen **OR** acetaminophen, hydrOXYzine, hypromellose, ondansetron **OR** ondansetron (ZOFRAN) IV, oxyCODONE, sodium chloride flush   I have personally reviewed following labs and imaging studies  LABORATORY DATA: CBC: Recent Labs  Lab 04/04/21 0334 04/05/21 0723 04/07/21 1251 04/08/21 0133 04/09/21 0209 04/10/21 0257  WBC 15.5* 13.2* 16.2* 13.7* 12.7* 13.5*  NEUTROABS 13.5*  --   --   --   --   --   HGB 8.4* 8.6* 9.2* 7.9* 8.0* 8.0*  HCT 25.8* 25.0* 27.3* 24.5* 24.4* 23.7*  MCV 102.8* 101.6* 103.8* 103.8* 103.4* 102.6*  PLT 489* 474* 411* 398 373 341    Basic Metabolic Panel: Recent Labs  Lab 04/05/21 0723 04/07/21 1251 04/08/21 0133 04/09/21 0209 04/10/21 0257  NA 136 132* 135 135 132*  K 3.6 3.7 3.7 3.6 3.6  CL 103 100 102 100 99  CO2 22 20* 20* 20* 18*  GLUCOSE 107* 176* 139* 128* 98  BUN 27* 28* 29* 38* 43*  CREATININE 6.37* 6.00* 6.39* 7.09* 8.13*  CALCIUM 8.8* 8.7* 8.6* 8.5* 8.7*  PHOS 4.3  --   --  3.9  --     GFR: Estimated Creatinine Clearance: 6.1  mL/min (A) (by C-G formula based on SCr of 8.13 mg/dL (H)).  Liver Function Tests: Recent Labs  Lab 04/05/21 0723 04/08/21 0133 04/09/21 0209 04/10/21 0257  AST  --  13*  --  12*  ALT  --  <5  --  5  ALKPHOS  --  152*  --  120  BILITOT  --  0.5  --  0.6  PROT  --  5.1*  --  5.1*  ALBUMIN 1.6* 1.7* 1.6* 1.6*   No results for input(s): LIPASE, AMYLASE  in the last 168 hours. No results for input(s): AMMONIA in the last 168 hours.  Coagulation Profile: Recent Labs  Lab 04/07/21 1251  INR 0.9    Cardiac Enzymes: No results for input(s): CKTOTAL, CKMB, CKMBINDEX, TROPONINI in the last 168 hours.  BNP (last 3 results) No results for input(s): PROBNP in the last 8760 hours.  Lipid Profile: No results for input(s): CHOL, HDL, LDLCALC, TRIG, CHOLHDL, LDLDIRECT in the last 72 hours.  Thyroid Function Tests: No results for input(s): TSH, T4TOTAL, FREET4, T3FREE, THYROIDAB in the last 72 hours.  Anemia Panel: No results for input(s): VITAMINB12, FOLATE, FERRITIN, TIBC, IRON, RETICCTPCT in the last 72 hours.  Urine analysis:    Component Value Date/Time   COLORURINE YELLOW 03/27/2021 1130   APPEARANCEUR HAZY (A) 03/27/2021 1130   LABSPEC 1.008 03/27/2021 1130   PHURINE 8.0 03/27/2021 1130   GLUCOSEU 50 (A) 03/27/2021 1130   HGBUR MODERATE (A) 03/27/2021 1130   BILIRUBINUR NEGATIVE 03/27/2021 1130   BILIRUBINUR negative 02/19/2018 1643   KETONESUR NEGATIVE 03/27/2021 1130   PROTEINUR 100 (A) 03/27/2021 1130   UROBILINOGEN 0.2 02/19/2018 1643   UROBILINOGEN 1.0 08/22/2012 1402   NITRITE NEGATIVE 03/27/2021 1130   LEUKOCYTESUR LARGE (A) 03/27/2021 1130    Sepsis Labs: Lactic Acid, Venous    Component Value Date/Time   LATICACIDVEN 1.8 04/07/2021 2237    MICROBIOLOGY: Recent Results (from the past 240 hour(s))  Body fluid culture w Gram Stain     Status: None   Collection Time: 04/01/21  1:37 PM   Specimen: Lung, Right Lower Lobe; Pleural Fluid  Result Value Ref  Range Status   Specimen Description PLEURAL FLUID  Final   Special Requests LUNG RIGHT LOWER LOBE  Final   Gram Stain   Final    RARE WBC PRESENT,BOTH PMN AND MONONUCLEAR NO ORGANISMS SEEN    Culture   Final    NO GROWTH Performed at [facility], [place] [address], [place], [place] [address]    Report Status 04/04/2021 FINAL  Final  Culture, blood (routine x 2)     Status: None   Collection Time: 04/02/21  9:57 AM   Specimen: BLOOD RIGHT WRIST  Result Value Ref Range Status   Specimen Description BLOOD RIGHT WRIST  Final   Special Requests   Final    BOTTLES DRAWN AEROBIC AND ANAEROBIC Blood Culture results may not be optimal due to an inadequate volume of blood received in culture bottles   Culture   Final    NO GROWTH 5 DAYS Performed at [facility], [place] [address], [place], [place] [address]    Report Status 04/07/2021 FINAL  Final  Culture, blood (routine x 2)     Status: None   Collection Time: 04/02/21  9:57 AM   Specimen: BLOOD RIGHT HAND  Result Value Ref Range Status   Specimen Description BLOOD RIGHT HAND  Final   Special Requests   Final    BOTTLES DRAWN AEROBIC AND ANAEROBIC Blood Culture results may not be optimal due to an inadequate volume of blood received in culture bottles   Culture   Final    NO GROWTH 5 DAYS Performed at [facility], [place] [address], [place], [place] [address]    Report Status 04/07/2021 FINAL  Final  MRSA Next Gen by PCR, Nasal     Status: None   Collection Time: 04/03/21  9:50 AM   Specimen: Nasal Mucosa; Nasal Swab  Result Value  Ref Range Status   MRSA by PCR Next Gen NOT DETECTED NOT DETECTED Final    Comment: (NOTE) The GeneXpert MRSA Assay (FDA approved for NASAL specimens only), is one component of a comprehensive MRSA colonization surveillance program. It is not intended to diagnose MRSA infection nor to guide or monitor treatment for MRSA infections. Test performance is not FDA approved in patients less than  54 years old. Performed at [facility], [place] [address] [place], [place] [address]   Resp Panel by RT-PCR (Flu A&B, Covid) Nasopharyngeal Swab     Status: None   Collection Time: 04/05/21  9:59 AM   Specimen: Nasopharyngeal Swab; Nasopharyngeal(NP) swabs in vial transport medium  Result Value Ref Range Status   SARS Coronavirus 2 by RT PCR NEGATIVE NEGATIVE Final    Comment: (NOTE) SARS-CoV-2 target nucleic acids are NOT DETECTED.  The SARS-CoV-2 RNA is generally detectable in upper respiratory specimens during the acute phase of infection. The lowest concentration of SARS-CoV-2 viral copies this assay can detect is 138 copies/mL. A negative result does not preclude SARS-Cov-2 infection and should not be used as the sole basis for treatment or other patient management decisions. A negative result may occur with  improper specimen collection/handling, submission of specimen other than nasopharyngeal swab, presence of viral mutation(s) within the areas targeted by this assay, and inadequate number of viral copies(<138 copies/mL). A negative result must be combined with clinical observations, patient history, and epidemiological information. The expected result is Negative.  Fact Sheet for Patients:  EntrepreneurPulse.com.au  Fact Sheet for Healthcare Providers:  IncredibleEmployment.be  This test is no t yet approved or cleared by the [place] FDA and  has been authorized for detection and/or diagnosis of SARS-CoV-2 by FDA under an Emergency Use Authorization (EUA). This EUA will remain  in effect (meaning this test can be used) for the duration of the COVID-19 declaration under Section 564(b)(1) of the Act, 21 U.S.C.section 360bbb-3(b)(1), unless the authorization is terminated  or revoked sooner.       Influenza A by PCR NEGATIVE NEGATIVE Final   Influenza B by PCR NEGATIVE NEGATIVE Final    Comment: (NOTE) The Xpert Xpress  SARS-CoV-2/FLU/RSV plus assay is intended as an aid in the diagnosis of influenza from Nasopharyngeal swab specimens and should not be used as a sole basis for treatment. Nasal washings and aspirates are unacceptable for Xpert Xpress SARS-CoV-2/FLU/RSV testing.  Fact Sheet for Patients: EntrepreneurPulse.com.au  Fact Sheet for Healthcare Providers: IncredibleEmployment.be  This test is not yet approved or cleared by the [place] FDA and has been authorized for detection and/or diagnosis of SARS-CoV-2 by FDA under an Emergency Use Authorization (EUA). This EUA will remain in effect (meaning this test can be used) for the duration of the COVID-19 declaration under Section 564(b)(1) of the Act, 21 U.S.C. section 360bbb-3(b)(1), unless the authorization is terminated or revoked.  Performed at [facility], [place] [address] [place], [place] [address]   MRSA Next Gen by PCR, Nasal     Status: None   Collection Time: 04/07/21  2:30 PM   Specimen: Nasopharyngeal Swab; Nasal Swab  Result Value Ref Range Status   MRSA by PCR Next Gen NOT DETECTED NOT DETECTED Final    Comment: (NOTE) The GeneXpert MRSA Assay (FDA approved for NASAL specimens only), is one component of a comprehensive MRSA colonization surveillance program. It is not intended to diagnose MRSA infection nor to guide or monitor treatment for MRSA infections. Test  performance is not FDA approved in patients less than 22 years old. Performed at [facility], [place] [address] [place], [place] [address]   Resp Panel by RT-PCR (Flu A&B, Covid) Nasopharyngeal Swab     Status: None   Collection Time: 04/07/21  3:02 PM   Specimen: Nasopharyngeal Swab; Nasopharyngeal(NP) swabs in vial transport medium  Result Value Ref Range Status   SARS Coronavirus 2 by RT PCR NEGATIVE NEGATIVE Final    Comment: (NOTE) SARS-CoV-2 target nucleic acids are NOT DETECTED.  The SARS-CoV-2 RNA is  generally detectable in upper respiratory specimens during the acute phase of infection. The lowest concentration of SARS-CoV-2 viral copies this assay can detect is 138 copies/mL. A negative result does not preclude SARS-Cov-2 infection and should not be used as the sole basis for treatment or other patient management decisions. A negative result may occur with  improper specimen collection/handling, submission of specimen other than nasopharyngeal swab, presence of viral mutation(s) within the areas targeted by this assay, and inadequate number of viral copies(<138 copies/mL). A negative result must be combined with clinical observations, patient history, and epidemiological information. The expected result is Negative.  Fact Sheet for Patients:  EntrepreneurPulse.com.au  Fact Sheet for Healthcare Providers:  IncredibleEmployment.be  This test is no t yet approved or cleared by the [place] FDA and  has been authorized for detection and/or diagnosis of SARS-CoV-2 by FDA under an Emergency Use Authorization (EUA). This EUA will remain  in effect (meaning this test can be used) for the duration of the COVID-19 declaration under Section 564(b)(1) of the Act, 21 U.S.C.section 360bbb-3(b)(1), unless the authorization is terminated  or revoked sooner.       Influenza A by PCR NEGATIVE NEGATIVE Final   Influenza B by PCR NEGATIVE NEGATIVE Final    Comment: (NOTE) The Xpert Xpress SARS-CoV-2/FLU/RSV plus assay is intended as an aid in the diagnosis of influenza from Nasopharyngeal swab specimens and should not be used as a sole basis for treatment. Nasal washings and aspirates are unacceptable for Xpert Xpress SARS-CoV-2/FLU/RSV testing.  Fact Sheet for Patients: EntrepreneurPulse.com.au  Fact Sheet for Healthcare Providers: IncredibleEmployment.be  This test is not yet approved or cleared by the [place] FDA and has been authorized for detection and/or diagnosis of SARS-CoV-2 by FDA under an Emergency Use Authorization (EUA). This EUA will remain in effect (meaning this test can be used) for the duration of the COVID-19 declaration under Section 564(b)(1) of the Act, 21 U.S.C. section 360bbb-3(b)(1), unless the authorization is terminated or revoked.  Performed at [facility], [place] [address], [place], [place] [address]   Culture, blood (routine x 2)     Status: None (Preliminary result)   Collection Time: 04/08/21  1:33 AM   Specimen: BLOOD  Result Value Ref Range Status   Specimen Description BLOOD RIGHT ANTECUBITAL  Final   Special Requests AEROBIC BOTTLE ONLY Blood Culture adequate volume  Final   Culture   Final    NO GROWTH 2 DAYS Performed at [facility], [place] [address] [place], [place] [address]    Report Status PENDING  Incomplete  Culture, blood (routine x 2)     Status: None (Preliminary result)   Collection Time: 04/08/21  1:33 AM   Specimen: BLOOD RIGHT HAND  Result Value Ref Range Status   Specimen Description BLOOD RIGHT HAND  Final   Special Requests AEROBIC BOTTLE ONLY Blood Culture adequate volume  Final   Culture  Final    NO GROWTH 2 DAYS Performed at [facility], [place] [address], [place], [place] [address]    Report Status PENDING  Incomplete    RADIOLOGY STUDIES/RESULTS: No results found.   LOS: 3 days   Oren Binet, MD  [facility]    To contact the attending provider between 7A-7P or the covering provider during after hours 7P-7A, please log into the web site [email or web address] and access using universal [place] password for that web site. If you do not have the password, please call the hospital operator.  04/10/2021, 12:42 PM

## 2021-04-10 NOTE — TOC Initial Note (Signed)
Transition of Care [facility]) - Initial/Assessment Note    Patient Details  Name: Elijah White MRN: [id number] Date of Birth: 06-17-1940  Transition of Care [facility]) CM/SW Contact:    Benard Halsted, LCSW Phone Number: 04/10/2021, 11:28 AM  Clinical Narrative:                 Patient resides at [facility] under long term care. CSW will continue to follow.   Expected Discharge Plan: Skilled Nursing Facility Barriers to Discharge: Continued Medical Work up   Patient Goals and CMS Choice Patient states their goals for this hospitalization and ongoing recovery are:: Feel better CMS Medicare.gov Compare Post Acute Care list provided to:: Patient Represenative (must comment) Choice offered to / list presented to : Adult Children  Expected Discharge Plan and Services Expected Discharge Plan: [place] In-house Referral: Clinical Social Work   Post Acute Care Choice: [place] Living arrangements for the past 2 months: [place]                                      Prior Living Arrangements/Services Living arrangements for the past 2 months: [place] Lives with:: Facility Resident Patient language and need for interpreter reviewed:: Yes Do you feel safe going back to the place where you live?: Yes      Need for Family Participation in Patient Care: Yes (Comment) Care giver support system in place?: Yes (comment)   Criminal Activity/Legal Involvement Pertinent to Current Situation/Hospitalization: No - Comment as needed  Activities of Daily Living      Permission Sought/Granted Permission sought to share information with : Facility Sport and exercise psychologist, Family Supports Permission granted to share information with : Yes, Verbal Permission Granted  Share Information with NAME: [name]  Permission granted to share info w AGENCY: [place] granted to share info w Relationship: Daughter  Permission  granted to share info w Contact Information: [phone]  Emotional Assessment Appearance:: Appears stated age Attitude/Demeanor/Rapport: Unable to Assess Affect (typically observed): Unable to Assess Orientation: : Oriented to Self, Oriented to Place Alcohol / Substance Use: Not Applicable Psych Involvement: No (comment)  Admission diagnosis:  Recurrent pneumonia [J18.9] Chest pain [R07.9] Sepsis ([place]) [A41.9] Hypotension, unspecified hypotension type [I95.9] Patient Active Problem List   Diagnosis Date Noted   Hypotension 04/07/2021   Parkinson's disease ([place]) 04/07/2021   Acute urinary retention with foley  04/07/2021   Pneumonia during recent hospitalization  04/07/2021   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[place]) 11/26/2019   Macrocytic anemia 11/26/2019   2.5 cm left adrenal nodule 11/26/2019   Colonic mass 11/26/2019   Allergy, unspecified, initial encounter 11/16/2019   Anaphylactic shock, unspecified, initial encounter 11/16/2019   Other long term (current) drug therapy 09/05/2019   Iron deficiency anemia, unspecified 07/13/2019  Encounter for immunization 04/17/2019   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   Dependence on renal dialysis ([place]) 04/16/2019   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 kidney disease, or unspecified chronic kidney  disease 04/16/2019   Localized edema 04/16/2019   Nicotine dependence, unspecified, uncomplicated 83/25/4982   Other disorders of phosphorus metabolism 04/16/2019   Pain, unspecified 04/16/2019   Pruritus, unspecified 04/16/2019   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   Shortness of breath 04/16/2019   Unspecified glaucoma 04/16/2019   Unspecified urinary incontinence 04/16/2019   ARF (acute renal failure) ([place]) 03/25/2018   Vision loss of right eye 11/11/2017   Bilateral pseudophakia 07/11/2017   GERD (gastroesophageal reflux disease)/esophageal candidiasis  09/10/2016   HLD (hyperlipidemia) 09/10/2016   Essential hypertension 08/13/2016   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) 10/22/2006   COLONIC POLYPS, HX OF 10/22/2006   PCP:  Maryella Shivers, MD Pharmacy:  No Pharmacies Listed    Social Determinants of Health (SDOH) Interventions    Readmission Risk Interventions Readmission Risk Prevention Plan 04/10/2021 03/17/2021  Transportation Screening Complete Complete  Medication Review ([place]) Complete -  PCP or Specialist appointment within 3-5 days of discharge Complete -  [place] or Home Care Consult Complete Complete  SW Recovery Care/Counseling Consult Complete Complete  Palliative Care Screening Not Applicable -  [place] Complete Complete  Some recent data might be hidden

## 2021-04-10 NOTE — Progress Notes (Signed)
Spoke to daughter who is an Therapist, sports; discussed he had more lethargy initially at start of shift 2/5 then was more alert when night meds were crushed to give to him.  He stated he did not want crushed meds. But whole.  He rested well overnight.  Daughter thinks we should avoid giving the hydroxyzine and oxycodone as this seemed to make him more lethargic.

## 2021-04-10 NOTE — Care Management Important Message (Signed)
Important Message  Patient Details  Name: MORT SMELSER MRN: [id number] Date of Birth: 15-Aug-1940   Medicare Important Message Given:  Yes     Orbie Pyo 04/10/2021, 2:18 PM

## 2021-04-10 NOTE — NC FL2 (Signed)
[place] LEVEL OF CARE SCREENING TOOL     IDENTIFICATION  Patient Name: Elijah White Birthdate: June 19, 1940 Sex: male Admission Date (Current Location): 04/07/2021  [place] and [place] Number:  [name] [id number] [place] and Address:  [facility] [place], [place] [address] [place], [place] [address]      Provider Number: [id number]  Attending Physician Name and Address:  Jonetta Osgood, MD  Relative Name and Phone Number:       Current Level of Care: Hospital Recommended Level of Care: [place] Prior Approval Number:    Date Approved/Denied:   PASRR Number: [id number]  Discharge Plan: SNF    Current Diagnoses: Patient Active Problem List   Diagnosis Date Noted   Hypotension 04/07/2021   Parkinson's disease ([place]) 04/07/2021   Acute urinary retention with foley  04/07/2021   Pneumonia during recent hospitalization  04/07/2021   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 04/07/2021   Paroxysmal atrial fibrillation ([place]) 04/07/2021   Pressure injury of skin 03/28/2021   PAD (peripheral artery disease) ([place])    Complicated UTI (urinary tract infection)    Acute cholecystitis 03/15/2021   Hypokalemia 09/28/2020   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, 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[place]) 08/12/2020   Blindness/glaucoma  01/19/2020   Acute on chronic diastolic heart failure ([place]) 01/19/2020   RBBB    Hypercalcemia 12/16/2019   Near syncope 12/13/2019   Moderate protein-calorie malnutrition ([place]) 12/04/2019   Severe aortic stenosis s/p TAVR     ESRD (end stage renal disease) ([place]) 11/26/2019   Macrocytic anemia 11/26/2019   2.5 cm left adrenal nodule 11/26/2019   Colonic mass 11/26/2019   Allergy, unspecified, initial encounter 11/16/2019   Anaphylactic shock, unspecified, initial encounter 11/16/2019   Other long term (current) drug therapy 09/05/2019   Iron deficiency anemia, unspecified 07/13/2019   Encounter for immunization  04/17/2019   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   Dependence on renal dialysis ([place]) 04/16/2019   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 kidney disease, or unspecified chronic kidney disease 04/16/2019   Localized edema 04/16/2019   Nicotine dependence, unspecified, uncomplicated 09/02/1599   Other disorders of phosphorus metabolism 04/16/2019   Pain, unspecified 04/16/2019   Pruritus, unspecified 04/16/2019   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/16/2019   Shortness of breath 04/16/2019   Unspecified glaucoma 04/16/2019   Unspecified urinary incontinence 04/16/2019   ARF (acute renal failure) ([place]) 03/25/2018   Vision loss of right eye 11/11/2017   Bilateral pseudophakia 07/11/2017   GERD (gastroesophageal reflux disease)/esophageal candidiasis  09/10/2016   HLD (hyperlipidemia) 09/10/2016   Essential hypertension 08/13/2016   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) 10/22/2006   COLONIC POLYPS, HX OF 10/22/2006    Orientation RESPIRATION BLADDER Height & Weight     Self, Place  Normal Incontinent (biliary tube) Weight:   Height:     BEHAVIORAL SYMPTOMS/MOOD NEUROLOGICAL BOWEL NUTRITION STATUS      Continent Diet (see dc summary)  AMBULATORY STATUS COMMUNICATION OF NEEDS Skin   Extensive Assist Verbally PU Stage and Appropriate Care, Other (Comment), Surgical wounds (Stage II on coccyx; non-pressure wound on penis; surgical puncture on abdomen)                       Personal Care Assistance Level of Assistance  Bathing, Feeding, Dressing Bathing Assistance: Maximum assistance Feeding assistance: Maximum assistance Dressing Assistance:  Maximum assistance     Functional Limitations Info  Sight, Hearing, Speech Sight Info: Impaired Hearing Info: Adequate Speech Info: Adequate    SPECIAL CARE FACTORS FREQUENCY  PT (By licensed PT), OT (By  licensed OT)     PT Frequency: 5x/week OT Frequency: 5x/week            Contractures Contractures Info: Not present    Additional Factors Info  Code Status, Allergies, Insulin Sliding Scale Code Status Info: DNR Allergies Info: NKA   Insulin Sliding Scale Info: See dc summary       Current Medications (04/10/2021):  This is the current hospital active medication list Current Facility-Administered Medications  Medication Dose Route Frequency Provider Last Rate Last Admin   (feeding supplement) PROSource Plus liquid 30 mL  30 mL Oral BID BM Loren Racer, PA-C   30 mL at 04/09/21 1203   0.9 %  sodium chloride infusion  250 mL Intravenous PRN Orma Flaming, MD       acetaminophen (TYLENOL) tablet 650 mg  650 mg Oral Q6H PRN Orma Flaming, MD   650 mg at 04/08/21 1047   Or   acetaminophen (TYLENOL) suppository 650 mg  650 mg Rectal Q6H PRN Orma Flaming, MD       atorvastatin (LIPITOR) tablet 10 mg  10 mg Oral q AM Orma Flaming, MD   10 mg at 04/09/21 1021   brimonidine (ALPHAGAN) 0.15 % ophthalmic solution 1 drop  1 drop Right Eye TID Orma Flaming, MD   1 drop at 04/09/21 2127   carbidopa-levodopa (SINEMET IR) 25-100 MG per tablet immediate release 1 tablet  1 tablet Oral TID Orma Flaming, MD   1 tablet at 04/09/21 2133   ceFEPIme (MAXIPIME) 1 g in sodium chloride 0.9 % 100 mL IVPB  1 g Intravenous Q24H Bertis Ruddy, RPH 200 mL/hr at 04/09/21 1522 1 g at 04/09/21 1522   Chlorhexidine Gluconate Cloth 2 % PADS 6 each  6 each Topical Q0600 Elmarie Shiley, MD   6 each at 04/08/21 0913   cinacalcet (SENSIPAR) tablet 60 mg  60 mg Oral Q M,W,F-HD Orma Flaming, MD       clopidogrel (PLAVIX) tablet 75 mg  75 mg Oral Q breakfast Ghimire, Henreitta Leber, MD       docusate sodium (COLACE) capsule 100 mg  100 mg Oral BID Orma Flaming, MD   100 mg at 04/09/21 2133   dorzolamide-timolol (COSOPT) 22.3-6.8 MG/ML ophthalmic solution 1 drop  1 drop Right Eye BID Orma Flaming, MD   1 drop  at 04/09/21 2130   ferric citrate (AURYXIA) tablet 420 mg  420 mg Oral TID WC Orma Flaming, MD   420 mg at 04/09/21 1200   gabapentin (NEURONTIN) capsule 100 mg  100 mg Oral Daily Orma Flaming, MD   100 mg at 04/09/21 1021   heparin injection 5,000 Units  5,000 Units Subcutaneous Q8H Orma Flaming, MD   5,000 Units at 04/10/21 1962   hydrOXYzine (ATARAX) tablet 25 mg  25 mg Oral Q8H PRN Orma Flaming, MD   25 mg at 04/09/21 1200   hypromellose (GENTEAL) 0.3 % ophthalmic ointment 1 application  1 application Left Eye I29N PRN Orma Flaming, MD       insulin aspart (novoLOG) injection 0-6 Units  0-6 Units Subcutaneous TID WC Orma Flaming, MD       latanoprost (XALATAN) 0.005 % ophthalmic solution 1 drop  1 drop Right Eye QHS Ghimire, Henreitta Leber, MD   1  drop at 04/09/21 2136   melatonin tablet 5 mg  5 mg Oral QHS Orma Flaming, MD   5 mg at 04/09/21 2133   metroNIDAZOLE (FLAGYL) IVPB 500 mg  500 mg Intravenous Q12H Orma Flaming, MD 100 mL/hr at 04/09/21 2009 500 mg at 04/09/21 2009   midodrine (PROAMATINE) tablet 10 mg  10 mg Oral TID WC Jonetta Osgood, MD   10 mg at 04/09/21 1739   multivitamin (RENA-VIT) tablet 1 tablet  1 tablet Oral QHS Joselyn Glassman A, RPH   1 tablet at 04/09/21 2133   nystatin (MYCOSTATIN) 100000 UNIT/ML suspension 500,000 Units  5 mL Oral QID Orma Flaming, MD   500,000 Units at 04/09/21 1739   ondansetron (ZOFRAN) tablet 4 mg  4 mg Oral Q6H PRN Orma Flaming, MD       Or   ondansetron [facility]) injection 4 mg  4 mg Intravenous Q6H PRN Orma Flaming, MD   4 mg at 04/09/21 1737   oxyCODONE (Oxy IR/ROXICODONE) immediate release tablet 5 mg  5 mg Oral Q6H PRN Orma Flaming, MD   5 mg at 04/09/21 1247   pantoprazole (PROTONIX) EC tablet 40 mg  40 mg Oral Q1200 Orma Flaming, MD   40 mg at 04/09/21 1200   pilocarpine (PILOCAR) 4 % ophthalmic solution 1 drop  1 drop Right Eye QID Orma Flaming, MD   1 drop at 04/09/21 2131   senna-docusate (Senokot-S) tablet 1  tablet  1 tablet Oral QHS Orma Flaming, MD   1 tablet at 04/09/21 2132   sevelamer carbonate (RENVELA) tablet 1,600 mg  1,600 mg Oral TID WC Orma Flaming, MD   1,600 mg at 04/09/21 1200   sodium chloride flush (NS) 0.9 % injection 3 mL  3 mL Intravenous Q12H Orma Flaming, MD   3 mL at 04/09/21 1023   sodium chloride flush (NS) 0.9 % injection 3 mL  3 mL Intravenous PRN Orma Flaming, MD         Discharge Medications: Please see discharge summary for a list of discharge medications.  Relevant Imaging Results:  Relevant Lab Results:   Additional Information [id number]. Moderna COVID-19 Vaccine 05/14/2019 , 04/16/2019  Moderna Covid-19 Booster Vaccine 01/15/2020. Dialysis MWF at [place], [place]

## 2021-04-10 NOTE — Progress Notes (Signed)
[facility] Progress Note     Subjective: CC:  Oriented to person, place, time. Denies abdominal pain, nausea, vomiting. Tolerating PO.  Objective: Vital signs in last 24 hours: Temp:  [97.7 F (36.5 C)-98.6 F (37 C)] 97.8 F (36.6 C) (02/06 1200) Pulse Rate:  [60-70] 64 (02/06 1200) Resp:  [11-23] 11 (02/06 1200) BP: (102-141)/(53-93) 141/64 (02/06 1200) SpO2:  [95 %-100 %] 100 % (02/06 0337) Last BM Date: 04/08/21  Intake/Output from previous day: 02/05 0701 - 02/06 0700 In: 120 [P.O.:120] Out: 0  Intake/Output this shift: No intake/output data recorded.  PE: Gen:  somnolent chronically ill appearing male, easily arouses appropriately to voice.  Card:  Regular rate and rhythm Pulm:  Normal effort, clear to auscultation bilaterally Abd: Soft, non-tender, non-distended,  perc chole tube in RUQ draining bilious fluid Skin: warm and dry, no rashes  Psych: A&Ox3   Lab Results:  Recent Labs    04/09/21 0209 04/10/21 0257  WBC 12.7* 13.5*  HGB 8.0* 8.0*  HCT 24.4* 23.7*  PLT 373 363   BMET Recent Labs    04/09/21 0209 04/10/21 0257  NA 135 132*  K 3.6 3.6  CL 100 99  CO2 20* 18*  GLUCOSE 128* 98  BUN 38* 43*  CREATININE 7.09* 8.13*  CALCIUM 8.5* 8.7*   PT/INR Recent Labs    04/07/21 1251  LABPROT 12.4  INR 0.9   CMP     Component Value Date/Time   NA 132 (L) 04/10/2021 0257   NA 134 (A) 09/11/2019 0000   K 3.6 04/10/2021 0257   CL 99 04/10/2021 0257   CO2 18 (L) 04/10/2021 0257   GLUCOSE 98 04/10/2021 0257   BUN 43 (H) 04/10/2021 0257   BUN 34 (A) 09/11/2019 0000   CREATININE 8.13 (H) 04/10/2021 0257   CALCIUM 8.7 (L) 04/10/2021 0257   PROT 5.1 (L) 04/10/2021 0257   PROT 6.9 02/19/2018 1014   ALBUMIN 1.6 (L) 04/10/2021 0257   ALBUMIN 4.2 02/19/2018 1014   AST 12 (L) 04/10/2021 0257   ALT 5 04/10/2021 0257   ALKPHOS 120 04/10/2021 0257   BILITOT 0.6 04/10/2021 0257   BILITOT 0.5 02/19/2018 1014   GFRNONAA 6 (L) 04/10/2021  0257   GFRAA 7 (L) 12/03/2019 0710   Lipase     Component Value Date/Time   LIPASE 29 03/31/2021 1102       Studies/Results: No results found.  Anti-infectives: Anti-infectives (From admission, onward)    Start     Dose/Rate Route Frequency Ordered Stop   04/10/21 1200  vancomycin (VANCOREADY) IVPB 750 mg/150 mL  Status:  Discontinued        750 mg 150 mL/hr over 60 Minutes Intravenous Every M-W-F (Hemodialysis) 04/07/21 1431 04/10/21 1046   04/08/21 1600  ceFEPIme (MAXIPIME) 1 g in sodium chloride 0.9 % 100 mL IVPB        1 g 200 mL/hr over 30 Minutes Intravenous Every 24 hours 04/07/21 1430     04/08/21 1530  fluconazole (DIFLUCAN) tablet 100 mg        100 mg Oral Daily 04/08/21 1431 04/08/21 1536   04/07/21 2000  metroNIDAZOLE (FLAGYL) IVPB 500 mg        500 mg 100 mL/hr over 60 Minutes Intravenous Every 12 hours 04/07/21 1830     04/07/21 1430  ceFEPIme (MAXIPIME) 2 g in sodium chloride 0.9 % 100 mL IVPB        2 g 200 mL/hr over 30 Minutes  Intravenous  Once 04/07/21 1429 04/07/21 1658   04/07/21 1430  vancomycin (VANCOREADY) IVPB 1500 mg/300 mL        1,500 mg 150 mL/hr over 120 Minutes Intravenous  Once 04/07/21 1429 04/07/21 2052        Assessment/Plan  Acute cholecystitis s/p replacement of cholecystostomy tube 03/28/2020 - re-admitted with diagnosis of multifactorial hypotension related to volume status, recent cholecystitis, parkinson's disease. CT scan of the abd and pelvis showed perc chole tube in good position with gallbladder distention. - cholecystostomy tube seems to be in good position and draining, patient denies pain, nausea, vomiting, and is tolerating PO. Cx from drainage on 1/24 with NGTD.  - ok to narrow abx from general surgery perspective, as I don't see any clinical evidence to support ongoing cholecystitis. Ok to narrow to PO antibiotics (Augmentin). From a surgical perspective he only needs 2 more days of antibiotics, this would be 14 days from  the time the perc chole tube was placed.  - No family at the bedside this morning when I rounded, none at the bedside currently 1230. I will call family and make sure to answer their questions related to cholecystostomy tube and possibility of future cholecystectomy. No urgent indications for surgery during this admission.    LOS: 3 days   I reviewed nursing notes, hospitalist notes, last 24 h vitals and pain scores, last 48 h intake and output, last 24 h labs and trends, and last 24 h imaging results.  This care required moderate level of medical decision making.   Obie Dredge, PA-C [facility] Please see Amion for pager number during day hours 7:00am-4:30pm

## 2021-04-10 NOTE — Progress Notes (Addendum)
Pharmacy Antibiotic Note  Elijah White is a 81 y.o. male admitted on 04/07/2021 presenting with sepsis/cholecystitis - 2 recent hospitalizations for acute cholecystitis requiring cholecystostomy tube placement. Pharmacy has been consulted for cefepime dosing. Also on Flagyl.  Addendum: Surgery ok to continue abx total of 14 days from perc chole tube placement.  Plan: D/c Vanc Cefepime  2g qHD - last dose today - will cover x 2 more days Flagyl with stop date of 2/8     Temp (24hrs), Avg:98.2 F (36.8 C), Min:97.7 F (36.5 C), Max:98.6 F (37 C)  Recent Labs  Lab 04/05/21 0723 04/07/21 1251 04/07/21 1833 04/07/21 2237 04/08/21 0133 04/09/21 0209 04/10/21 0257  WBC 13.2* 16.2*  --   --  13.7* 12.7* 13.5*  CREATININE 6.37* 6.00*  --   --  6.39* 7.09* 8.13*  LATICACIDVEN  --   --  1.1 1.8  --   --   --      Estimated Creatinine Clearance: 6.1 mL/min (A) (by C-G formula based on SCr of 8.13 mg/dL (H)).    No Known Allergies  Vanc 2/3 >2/6  Cefepime 2/3>2/6 (coverage through 2/8) Flagyl 2/3>2/8  2/3 MRSA neg 2/4 BCx: ngtd  Sherlon Handing, PharmD, BCPS Please see amion for complete clinical pharmacist phone list 04/10/2021 11:12 AM

## 2021-04-11 LAB — GLUCOSE, CAPILLARY
Glucose-Capillary: 67 mg/dL — ABNORMAL LOW (ref 70–99)
Glucose-Capillary: 63 mg/dL — ABNORMAL LOW (ref 70–99)
Glucose-Capillary: 55 mg/dL — ABNORMAL LOW (ref 70–99)
Glucose-Capillary: 63 mg/dL — ABNORMAL LOW (ref 70–99)
Glucose-Capillary: 140 mg/dL — ABNORMAL HIGH (ref 70–99)
Glucose-Capillary: 156 mg/dL — ABNORMAL HIGH (ref 70–99)
Glucose-Capillary: 146 mg/dL — ABNORMAL HIGH (ref 70–99)

## 2021-04-11 MED ORDER — DARBEPOETIN ALFA 200 MCG/0.4ML IJ SOSY
200.0000 ug | PREFILLED_SYRINGE | INTRAMUSCULAR | Status: DC
  Filled 2021-04-11: qty 0.4

## 2021-04-11 MED ORDER — SODIUM CHLORIDE 0.9 % IV SOLN
2.0000 g | INTRAVENOUS | Status: AC
  Administered 2021-04-11: 2 g via INTRAVENOUS
  Filled 2021-04-11: qty 2

## 2021-04-11 MED ORDER — DEXTROSE 50 % IV SOLN
25.0000 mL | Freq: Once | INTRAVENOUS | Status: AC
Start: 2021-04-11 — End: 2021-04-10

## 2021-04-11 NOTE — Progress Notes (Addendum)
Page per pager-system noted that Mr Apostolos family requesting to speak to the nurse. I was in another room, and after completing care with another patient, I gathered Mr Archuleta 10 am meds. Upon my arrival to room Mr Shahab cousin stating he had a BM and needed to be cleaned, drsg needed to be changed and she had been waiting for an hour for somebody to come and help her father. I apologized, stating I did not hear a page one hour ago, provided peri-care. The sacral dressing was already changed this morning during his bath, and the dressing was still clean. I questioned Anitra, the nurses tech, if she heard a page an hour ago and she did not hear the page either. She came to the room checking on the patient after she heard a page, but I was already in the room attending to the patient. Charge nurse notified.

## 2021-04-11 NOTE — Progress Notes (Signed)
Pt refused night time medications, RN attempted at 21:30 to educate the pt on the importance of his medication and pt refused education and refused to take medications RN attempted again at 22:20 to educate the pt and pt continue to refused night time medications. Provider Gean Birchwood MD notified. No new orders.

## 2021-04-11 NOTE — Progress Notes (Addendum)
PROGRESS NOTE        PATIENT DETAILS Name: Elijah White Age: 81 y.o. Sex: male Date of Birth: Aug 29, 1940 Admit Date: 04/07/2021 Admitting Physician Orma Flaming, MD [id number], Beatriz Chancellor, MD  Brief Summary: Patient is a 81 y.o.  male with history of ESRD on HD MWF, PAD-s/p left BKA, HTN, DM-2-with 2 recent hospitalizations for acute cholecystitis requiring cholecystostomy tube placement-referred to the ED from HD center for hypotension.  See below for further details  Significant Hospital events: 1/11-1/22>> hospitalized for sepsis due to acute cholecystitis-underwent cholecystostomy drain placement on 1/12-drain pulled out on 1/17-monitored-stabilized on abx and discharged to SNF without cholecystostomy drain 1/23-2/1>> readmitted for severe abdominal pain-cholecystostomy tube placed on 1/24-discharge back to SNF 2/4>> readmitted for hypotension/RUQ pain  Significant imaging studies: 2/3>> CT abdomen/pelvis: Per cholecystostomy in place-but persistent distention of the gallbladder, progressive anasarca, 2.5 cm left adrenal nodule  Significant microbiology data: 2/3>> COVID/flu PCR: Negative 2/3>> blood cultures: Negative  Procedures: None  Consults:  PCCM, Renal, IR  Subjective: Complains of pain in his gluteal area-appears slightly uncomfortable.  Per RN-no major issues overnight.  Objective: Vitals: Blood pressure (!) 123/50, pulse 79, temperature 98.2 F (36.8 C), temperature source Axillary, resp. rate 14, SpO2 98 %.   Exam: Gen Exam:not in any distress HEENT:atraumatic, normocephalic Chest: B/L clear to auscultation anteriorly CVS:S1S2 regular Abdomen:soft non tender, non distended Extremities: Left AKA Neurology: Non focal Skin: no rash   Pertinent Labs/Radiology: CBC Latest Ref Rng & Units 04/10/2021 04/09/2021 04/08/2021  WBC 4.0 - 10.5 K/uL 13.5(H) 12.7(H) 13.7(H)  Hemoglobin 13.0 - 17.0 g/dL 8.0(L) 8.0(L) 7.9(L)  Hematocrit 39.0 -  52.0 % 23.7(L) 24.4(L) 24.5(L)  Platelets 150 - 400 K/uL 363 373 398    Lab Results  Component Value Date   NA 132 (L) 04/10/2021   K 3.6 04/10/2021   CL 99 04/10/2021   CO2 18 (L) 04/10/2021      Assessment/Plan: * Hypotension- (present on admission) Suspect multifactorial etiology-hypovolemia-smoldering sepsis from ongoing cholecystitis, fluid shift with hemodialysis and autonomic dysfunction associated with Parkinson's disease.  BP stable over the past several days on midodrine.  Tolerated HD yesterday without any major issues.  Acute cholecystitis- (present on admission) Appreciate general surgical input-cholecystostomy drain in place-we will complete last dose of IV cefepime today.  Abdominal exam remains benign.    Pneumonia during recent hospitalization - (present on admission) Suspect sympathetic/PNA from neighboring cholecystitis-already treated during prior hospitalization-on broad-spectrum antimicrobial therapy in any event.  See above.  Anemia in chronic kidney disease- (present on admission) No evidence of blood loss-defer Aranesp/iron to nephrology.   ESRD (end stage renal disease) ([place])- (present on admission) Nephrology following for HD.   Acute urinary retention with foley - (present on admission) At hospitalization on 1/74 he had complicated UTI, urinary retention and hydronephrosis requiring foley placement.  Urology follow-up in the outpatient setting was recommended-he has not yet seen yet.  At family's request-Foley catheter was discontinued on 2/5-Per nursing staff-he is oliguric at baseline and is voiding very minimally.  Continue to watch closely.    Paroxysmal atrial fibrillation (HCC)- (present on admission) Maintaining sinus rhythm-not on anticoagulation due to severity of anemia/concern for GI bleeding and advanced age.  EGD last month was stable.  Resume metoprolol when BP permits  Severe aortic stenosis s/p TAVR  Stable. Echo 03/2021 EF of 60-65%  with TAVR with normal function/placement   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- (present on admission) Continue Plavix  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)- (present on admission) CBGs as below-no longer on SSI.  Encourage oral intake-allow permissive hyperglycemia-not a candidate for tight glycemic control.    Recent Labs    04/10/21 1646 04/10/21 2101 04/11/21 0752  GLUCAP 100* 84 67*    GERD (gastroesophageal reflux disease)/esophageal candidiasis - (present on admission) Continue protonix.  S/p EGD 03/2021 during hospitalization showed chronic gastritis-suspicion for Candida-completed therapy with Diflucan  Blindness/glaucoma  Continue with eye drops.  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  Parkinson's disease ([place])- (present on admission) Followed by neurology in the outpatient setting-continue Sinemet  2.5 cm left adrenal nodule- (present on admission) Radiology recommending dedicated CT/MRI examination once acute issues have resolved-we will defer to patient's primary care practitioner.  Pressure injury of skin- (present on admission) Pressure Injury 03/28/21 Coccyx Medial Stage 2 -  Partial thickness loss of dermis presenting as a shallow open injury with a red, pink wound bed without slough. wound bed is pink, no drainage noted (Active)  03/28/21 1028  Location: Coccyx  Location Orientation: Medial  Staging: Stage 2 -  Partial thickness loss of dermis presenting as a shallow open injury with a red, pink wound bed without slough.  Wound Description (Comments): wound bed is pink, no drainage noted  Present on Admission: Yes       Sepsis (HCC)-resolved as of 04/07/2021, (present on admission) 81 year old presenting from dialysis with hypotension and right sided chest pain found to meet sepsis criteria with elevated WBC and hypotension   BMI: Estimated body mass index is 24.29 kg/m as calculated from the following:   Height  as of 03/28/21: 5\' 5"  (1.651 m).   Weight as of 04/05/21: 66.2 kg.   Code status:   Code Status: DNR   DVT Prophylaxis: heparin injection 5,000 Units Start: 04/07/21 2200   Family Communication: Daughter-[name] Foster-[phone] updated on 2/6   Disposition Plan: Status is: Inpatient Remains inpatient appropriate because: Will finish IV antibiotics today-following which-potential discharge back to SNF tomorrow.   Planned Discharge Destination:Skilled nursing facility   Diet: Diet Order             Diet renal with fluid restriction Fluid restriction: 1200 mL Fluid; Room service appropriate? Yes; Fluid consistency: Thin  Diet effective now                     Antimicrobial agents: Anti-infectives (From admission, onward)    Start     Dose/Rate Route Frequency Ordered Stop   04/11/21 1015  ceFEPIme (MAXIPIME) 2 g in sodium chloride 0.9 % 100 mL IVPB        2 g 200 mL/hr over 30 Minutes Intravenous STAT 04/11/21 0928 04/11/21 1151   04/10/21 2200  metroNIDAZOLE (FLAGYL) tablet 500 mg        500 mg Oral Every 12 hours 04/10/21 1249 04/12/21 0959   04/10/21 1300  ceFEPIme (MAXIPIME) 2 g in sodium chloride 0.9 % 100 mL IVPB  Status:  Discontinued        2 g 200 mL/hr over 30 Minutes Intravenous Every M-W-F (Hemodialysis) 04/10/21 1245 04/11/21 0928   04/10/21 1200  vancomycin (VANCOREADY) IVPB 750 mg/150 mL  Status:  Discontinued        750 mg 150 mL/hr over 60 Minutes Intravenous Every M-W-F (Hemodialysis) 04/07/21 1431 04/10/21 1046  04/08/21 1600  ceFEPIme (MAXIPIME) 1 g in sodium chloride 0.9 % 100 mL IVPB  Status:  Discontinued        1 g 200 mL/hr over 30 Minutes Intravenous Every 24 hours 04/07/21 1430 04/10/21 1245   04/08/21 1530  fluconazole (DIFLUCAN) tablet 100 mg        100 mg Oral Daily 04/08/21 1431 04/08/21 1536   04/07/21 2000  metroNIDAZOLE (FLAGYL) IVPB 500 mg  Status:  Discontinued        500 mg 100 mL/hr over 60 Minutes Intravenous Every 12 hours  04/07/21 1830 04/10/21 1249   04/07/21 1430  ceFEPIme (MAXIPIME) 2 g in sodium chloride 0.9 % 100 mL IVPB        2 g 200 mL/hr over 30 Minutes Intravenous  Once 04/07/21 1429 04/07/21 1658   04/07/21 1430  vancomycin (VANCOREADY) IVPB 1500 mg/300 mL        1,500 mg 150 mL/hr over 120 Minutes Intravenous  Once 04/07/21 1429 04/07/21 2052        MEDICATIONS: Scheduled Meds:  (feeding supplement) PROSource Plus  30 mL Oral BID BM   atorvastatin  10 mg Oral q AM   brimonidine  1 drop Right Eye TID   carbidopa-levodopa  1 tablet Oral TID   Chlorhexidine Gluconate Cloth  6 each Topical Q0600   cinacalcet  60 mg Oral Q M,W,F-HD   clopidogrel  75 mg Oral Q breakfast   dextrose  12.5 g Intravenous STAT   docusate sodium  100 mg Oral BID   dorzolamide-timolol  1 drop Right Eye BID   ferric citrate  420 mg Oral TID WC   gabapentin  100 mg Oral Daily   heparin  5,000 Units Subcutaneous Q8H   latanoprost  1 drop Right Eye QHS   melatonin  5 mg Oral QHS   metroNIDAZOLE  500 mg Oral Q12H   midodrine  10 mg Oral TID WC   multivitamin  1 tablet Oral QHS   nystatin  5 mL Oral QID   pantoprazole  40 mg Oral Q1200   pilocarpine  1 drop Right Eye QID   senna-docusate  1 tablet Oral QHS   sevelamer carbonate  1,600 mg Oral TID WC   sodium chloride flush  3 mL Intravenous Q12H   Continuous Infusions:  sodium chloride     PRN Meds:.sodium chloride, acetaminophen **OR** acetaminophen, hydrOXYzine, hypromellose, ondansetron **OR** ondansetron (ZOFRAN) IV, oxyCODONE, sodium chloride flush   I have personally reviewed following labs and imaging studies  LABORATORY DATA: CBC: Recent Labs  Lab 04/05/21 0723 04/07/21 1251 04/08/21 0133 04/09/21 0209 04/10/21 0257  WBC 13.2* 16.2* 13.7* 12.7* 13.5*  HGB 8.6* 9.2* 7.9* 8.0* 8.0*  HCT 25.0* 27.3* 24.5* 24.4* 23.7*  MCV 101.6* 103.8* 103.8* 103.4* 102.6*  PLT 474* 411* 398 373 914    Basic Metabolic Panel: Recent Labs  Lab  04/05/21 0723 04/07/21 1251 04/08/21 0133 04/09/21 0209 04/10/21 0257  NA 136 132* 135 135 132*  K 3.6 3.7 3.7 3.6 3.6  CL 103 100 102 100 99  CO2 22 20* 20* 20* 18*  GLUCOSE 107* 176* 139* 128* 98  BUN 27* 28* 29* 38* 43*  CREATININE 6.37* 6.00* 6.39* 7.09* 8.13*  CALCIUM 8.8* 8.7* 8.6* 8.5* 8.7*  PHOS 4.3  --   --  3.9  --     GFR: Estimated Creatinine Clearance: 6.1 mL/min (A) (by C-G formula based on SCr of 8.13 mg/dL (H)).  Liver Function  Tests: Recent Labs  Lab 04/05/21 0723 04/08/21 0133 04/09/21 0209 04/10/21 0257  AST  --  13*  --  12*  ALT  --  <5  --  5  ALKPHOS  --  152*  --  120  BILITOT  --  0.5  --  0.6  PROT  --  5.1*  --  5.1*  ALBUMIN 1.6* 1.7* 1.6* 1.6*   No results for input(s): LIPASE, AMYLASE in the last 168 hours. No results for input(s): AMMONIA in the last 168 hours.  Coagulation Profile: Recent Labs  Lab 04/07/21 1251  INR 0.9    Cardiac Enzymes: No results for input(s): CKTOTAL, CKMB, CKMBINDEX, TROPONINI in the last 168 hours.  BNP (last 3 results) No results for input(s): PROBNP in the last 8760 hours.  Lipid Profile: No results for input(s): CHOL, HDL, LDLCALC, TRIG, CHOLHDL, LDLDIRECT in the last 72 hours.  Thyroid Function Tests: No results for input(s): TSH, T4TOTAL, FREET4, T3FREE, THYROIDAB in the last 72 hours.  Anemia Panel: No results for input(s): VITAMINB12, FOLATE, FERRITIN, TIBC, IRON, RETICCTPCT in the last 72 hours.  Urine analysis:    Component Value Date/Time   COLORURINE YELLOW 03/27/2021 1130   APPEARANCEUR HAZY (A) 03/27/2021 1130   LABSPEC 1.008 03/27/2021 1130   PHURINE 8.0 03/27/2021 1130   GLUCOSEU 50 (A) 03/27/2021 1130   HGBUR MODERATE (A) 03/27/2021 1130   BILIRUBINUR NEGATIVE 03/27/2021 1130   BILIRUBINUR negative 02/19/2018 1643   KETONESUR NEGATIVE 03/27/2021 1130   PROTEINUR 100 (A) 03/27/2021 1130   UROBILINOGEN 0.2 02/19/2018 1643   UROBILINOGEN 1.0 08/22/2012 1402   NITRITE  NEGATIVE 03/27/2021 1130   LEUKOCYTESUR LARGE (A) 03/27/2021 1130    Sepsis Labs: Lactic Acid, Venous    Component Value Date/Time   LATICACIDVEN 1.8 04/07/2021 2237    MICROBIOLOGY: Recent Results (from the past 240 hour(s))  Body fluid culture w Gram Stain     Status: None   Collection Time: 04/01/21  1:37 PM   Specimen: Lung, Right Lower Lobe; Pleural Fluid  Result Value Ref Range Status   Specimen Description PLEURAL FLUID  Final   Special Requests LUNG RIGHT LOWER LOBE  Final   Gram Stain   Final    RARE WBC PRESENT,BOTH PMN AND MONONUCLEAR NO ORGANISMS SEEN    Culture   Final    NO GROWTH Performed at [facility], [place] [address] [place], [place] [address]    Report Status 04/04/2021 FINAL  Final  Culture, blood (routine x 2)     Status: None   Collection Time: 04/02/21  9:57 AM   Specimen: BLOOD RIGHT WRIST  Result Value Ref Range Status   Specimen Description BLOOD RIGHT WRIST  Final   Special Requests   Final    BOTTLES DRAWN AEROBIC AND ANAEROBIC Blood Culture results may not be optimal due to an inadequate volume of blood received in culture bottles   Culture   Final    NO GROWTH 5 DAYS Performed at [facility], [place] [address], [place], [place] [address]    Report Status 04/07/2021 FINAL  Final  Culture, blood (routine x 2)     Status: None   Collection Time: 04/02/21  9:57 AM   Specimen: BLOOD RIGHT HAND  Result Value Ref Range Status   Specimen Description BLOOD RIGHT HAND  Final   Special Requests   Final    BOTTLES DRAWN AEROBIC AND ANAEROBIC Blood Culture results may not be optimal  due to an inadequate volume of blood received in culture bottles   Culture   Final    NO GROWTH 5 DAYS Performed at [facility], [place] [address] [place], [place] [address]    Report Status 04/07/2021 FINAL  Final  MRSA Next Gen by PCR, Nasal     Status: None   Collection Time: 04/03/21  9:50 AM   Specimen: Nasal Mucosa; Nasal Swab  Result Value Ref  Range Status   MRSA by PCR Next Gen NOT DETECTED NOT DETECTED Final    Comment: (NOTE) The GeneXpert MRSA Assay (FDA approved for NASAL specimens only), is one component of a comprehensive MRSA colonization surveillance program. It is not intended to diagnose MRSA infection nor to guide or monitor treatment for MRSA infections. Test performance is not FDA approved in patients less than 41 years old. Performed at [facility], [place] [address] [place], [place] [address]   Resp Panel by RT-PCR (Flu A&B, Covid) Nasopharyngeal Swab     Status: None   Collection Time: 04/05/21  9:59 AM   Specimen: Nasopharyngeal Swab; Nasopharyngeal(NP) swabs in vial transport medium  Result Value Ref Range Status   SARS Coronavirus 2 by RT PCR NEGATIVE NEGATIVE Final    Comment: (NOTE) SARS-CoV-2 target nucleic acids are NOT DETECTED.  The SARS-CoV-2 RNA is generally detectable in upper respiratory specimens during the acute phase of infection. The lowest concentration of SARS-CoV-2 viral copies this assay can detect is 138 copies/mL. A negative result does not preclude SARS-Cov-2 infection and should not be used as the sole basis for treatment or other patient management decisions. A negative result may occur with  improper specimen collection/handling, submission of specimen other than nasopharyngeal swab, presence of viral mutation(s) within the areas targeted by this assay, and inadequate number of viral copies(<138 copies/mL). A negative result must be combined with clinical observations, patient history, and epidemiological information. The expected result is Negative.  Fact Sheet for Patients:  EntrepreneurPulse.com.au  Fact Sheet for Healthcare Providers:  IncredibleEmployment.be  This test is no t yet approved or cleared by the [place] FDA and  has been authorized for detection and/or diagnosis of SARS-CoV-2 by FDA under an Emergency Use  Authorization (EUA). This EUA will remain  in effect (meaning this test can be used) for the duration of the COVID-19 declaration under Section 564(b)(1) of the Act, 21 U.S.C.section 360bbb-3(b)(1), unless the authorization is terminated  or revoked sooner.       Influenza A by PCR NEGATIVE NEGATIVE Final   Influenza B by PCR NEGATIVE NEGATIVE Final    Comment: (NOTE) The Xpert Xpress SARS-CoV-2/FLU/RSV plus assay is intended as an aid in the diagnosis of influenza from Nasopharyngeal swab specimens and should not be used as a sole basis for treatment. Nasal washings and aspirates are unacceptable for Xpert Xpress SARS-CoV-2/FLU/RSV testing.  Fact Sheet for Patients: EntrepreneurPulse.com.au  Fact Sheet for Healthcare Providers: IncredibleEmployment.be  This test is not yet approved or cleared by the [place] FDA and has been authorized for detection and/or diagnosis of SARS-CoV-2 by FDA under an Emergency Use Authorization (EUA). This EUA will remain in effect (meaning this test can be used) for the duration of the COVID-19 declaration under Section 564(b)(1) of the Act, 21 U.S.C. section 360bbb-3(b)(1), unless the authorization is terminated or revoked.  Performed at [facility], [place] [address], [place], [place] [address]   MRSA Next Gen by PCR, Nasal     Status:  None   Collection Time: 04/07/21  2:30 PM   Specimen: Nasopharyngeal Swab; Nasal Swab  Result Value Ref Range Status   MRSA by PCR Next Gen NOT DETECTED NOT DETECTED Final    Comment: (NOTE) The GeneXpert MRSA Assay (FDA approved for NASAL specimens only), is one component of a comprehensive MRSA colonization surveillance program. It is not intended to diagnose MRSA infection nor to guide or monitor treatment for MRSA infections. Test performance is not FDA approved in patients less than 52 years old. Performed at [facility], [place] [address]  [place], [place] [address]   Resp Panel by RT-PCR (Flu A&B, Covid) Nasopharyngeal Swab     Status: None   Collection Time: 04/07/21  3:02 PM   Specimen: Nasopharyngeal Swab; Nasopharyngeal(NP) swabs in vial transport medium  Result Value Ref Range Status   SARS Coronavirus 2 by RT PCR NEGATIVE NEGATIVE Final    Comment: (NOTE) SARS-CoV-2 target nucleic acids are NOT DETECTED.  The SARS-CoV-2 RNA is generally detectable in upper respiratory specimens during the acute phase of infection. The lowest concentration of SARS-CoV-2 viral copies this assay can detect is 138 copies/mL. A negative result does not preclude SARS-Cov-2 infection and should not be used as the sole basis for treatment or other patient management decisions. A negative result may occur with  improper specimen collection/handling, submission of specimen other than nasopharyngeal swab, presence of viral mutation(s) within the areas targeted by this assay, and inadequate number of viral copies(<138 copies/mL). A negative result must be combined with clinical observations, patient history, and epidemiological information. The expected result is Negative.  Fact Sheet for Patients:  EntrepreneurPulse.com.au  Fact Sheet for Healthcare Providers:  IncredibleEmployment.be  This test is no t yet approved or cleared by the [place] FDA and  has been authorized for detection and/or diagnosis of SARS-CoV-2 by FDA under an Emergency Use Authorization (EUA). This EUA will remain  in effect (meaning this test can be used) for the duration of the COVID-19 declaration under Section 564(b)(1) of the Act, 21 U.S.C.section 360bbb-3(b)(1), unless the authorization is terminated  or revoked sooner.       Influenza A by PCR NEGATIVE NEGATIVE Final   Influenza B by PCR NEGATIVE NEGATIVE Final    Comment: (NOTE) The Xpert Xpress SARS-CoV-2/FLU/RSV plus assay is intended as an aid in the diagnosis of  influenza from Nasopharyngeal swab specimens and should not be used as a sole basis for treatment. Nasal washings and aspirates are unacceptable for Xpert Xpress SARS-CoV-2/FLU/RSV testing.  Fact Sheet for Patients: EntrepreneurPulse.com.au  Fact Sheet for Healthcare Providers: IncredibleEmployment.be  This test is not yet approved or cleared by the [place] FDA and has been authorized for detection and/or diagnosis of SARS-CoV-2 by FDA under an Emergency Use Authorization (EUA). This EUA will remain in effect (meaning this test can be used) for the duration of the COVID-19 declaration under Section 564(b)(1) of the Act, 21 U.S.C. section 360bbb-3(b)(1), unless the authorization is terminated or revoked.  Performed at [facility], [place] [address] [place], [place] [address]   Culture, blood (routine x 2)     Status: None (Preliminary result)   Collection Time: 04/08/21  1:33 AM   Specimen: BLOOD  Result Value Ref Range Status   Specimen Description BLOOD RIGHT ANTECUBITAL  Final   Special Requests AEROBIC BOTTLE ONLY Blood Culture adequate volume  Final   Culture   Final    NO GROWTH 3 DAYS Performed at [facility], [place] [address], [place], [place] [address]    Report Status PENDING  Incomplete  Culture, blood (routine x 2)     Status: None (Preliminary result)   Collection Time: 04/08/21  1:33 AM   Specimen: BLOOD RIGHT HAND  Result Value Ref Range Status   Specimen Description BLOOD RIGHT HAND  Final   Special Requests AEROBIC BOTTLE ONLY Blood Culture adequate volume  Final   Culture   Final    NO GROWTH 3 DAYS Performed at [facility], [place] [address] [place], [place] [address]    Report Status PENDING  Incomplete    RADIOLOGY STUDIES/RESULTS: No results found.   LOS: 4 days   Oren Binet, MD  [facility]    To contact the attending provider between 7A-7P or the covering provider during  after hours 7P-7A, please log into the web site [email or web address] and access using universal [place] password for that web site. If you do not have the password, please call the hospital operator.  04/11/2021, 12:06 PM

## 2021-04-11 NOTE — Plan of Care (Signed)

## 2021-04-11 NOTE — Progress Notes (Signed)
Report received. Patient aggitated, stating " get out of here" repeatedly, during room rounds. Night shift RN stated he refused his morning meds and his aggitation started this morning. Unable to calm patient and I exited his room per his request.

## 2021-04-11 NOTE — Progress Notes (Signed)
[facility] Progress Note   Subjective:   Patient seen and examined at bedside.  More alert and interactive today.  Complains of "pain on my backside", waiting for nurse to come in to reposition him.  States he felt like he was kidnapped last night, likely due to dialysis being completed overnight.  Will try to have dialysis completed during the day if possible.   Objective Vitals:   04/11/21 0200 04/11/21 0223 04/11/21 0301 04/11/21 0820  BP: 110/64 (!) 99/50 (!) 107/43 (!) 123/50  Pulse: 65 67 62 79  Resp: 20 19  14   Temp:   97.8 F (36.6 C) 98.2 F (36.8 C)  TempSrc:   Oral Axillary  SpO2: 100% 99% 98% 98%   Physical Exam General:chronically ill appearing, blind male in NAD Heart:RRR, no mrg Lungs:CTAB, nml WOB on RA Abdomen:soft, NTND Extremities:no LE edema, L BKA Dialysis Access: LU AVF +b/t   There were no vitals filed for this visit.  Intake/Output Summary (Last 24 hours) at 04/11/2021 1121 Last data filed at 04/11/2021 0223 Gross per 24 hour  Intake 125 ml  Output 1510 ml  Net -1385 ml    Additional Objective Labs: Basic Metabolic Panel: Recent Labs  Lab 04/05/21 0723 04/07/21 1251 04/08/21 0133 04/09/21 0209 04/10/21 0257  NA 136   < > 135 135 132*  K 3.6   < > 3.7 3.6 3.6  CL 103   < > 102 100 99  CO2 22   < > 20* 20* 18*  GLUCOSE 107*   < > 139* 128* 98  BUN 27*   < > 29* 38* 43*  CREATININE 6.37*   < > 6.39* 7.09* 8.13*  CALCIUM 8.8*   < > 8.6* 8.5* 8.7*  PHOS 4.3  --   --  3.9  --    < > = values in this interval not displayed.   Liver Function Tests: Recent Labs  Lab 04/08/21 0133 04/09/21 0209 04/10/21 0257  AST 13*  --  12*  ALT <5  --  5  ALKPHOS 152*  --  120  BILITOT 0.5  --  0.6  PROT 5.1*  --  5.1*  ALBUMIN 1.7* 1.6* 1.6*    CBC: Recent Labs  Lab 04/05/21 0723 04/07/21 1251 04/08/21 0133 04/09/21 0209 04/10/21 0257  WBC 13.2* 16.2* 13.7* 12.7* 13.5*  HGB 8.6* 9.2* 7.9* 8.0* 8.0*  HCT 25.0* 27.3* 24.5*  24.4* 23.7*  MCV 101.6* 103.8* 103.8* 103.4* 102.6*  PLT 474* 411* 398 373 363   Blood Culture    Component Value Date/Time   SDES BLOOD RIGHT ANTECUBITAL 04/08/2021 0133   SDES BLOOD RIGHT HAND 04/08/2021 0133   SPECREQUEST AEROBIC BOTTLE ONLY Blood Culture adequate volume 04/08/2021 0133   SPECREQUEST AEROBIC BOTTLE ONLY Blood Culture adequate volume 04/08/2021 0133   CULT  04/08/2021 0133    NO GROWTH 3 DAYS Performed at [facility], [place] [address] [place], [place] [address]    CULT  04/08/2021 0133    NO GROWTH 3 DAYS Performed at [facility] [address] [place], [place] [id number]    REPTSTATUS PENDING 04/08/2021 0133   REPTSTATUS PENDING 04/08/2021 0133    CBG: Recent Labs  Lab 04/10/21 1211 04/10/21 1253 04/10/21 1646 04/10/21 2101 04/11/21 0752  GLUCAP 66* 115* 100* 84 67*   Medications:  sodium chloride     ceFEPime (MAXIPIME) IV      (feeding supplement) PROSource Plus  30 mL  Oral BID BM   atorvastatin  10 mg Oral q AM   brimonidine  1 drop Right Eye TID   carbidopa-levodopa  1 tablet Oral TID   Chlorhexidine Gluconate Cloth  6 each Topical Q0600   cinacalcet  60 mg Oral Q M,W,F-HD   clopidogrel  75 mg Oral Q breakfast   dextrose  12.5 g Intravenous STAT   docusate sodium  100 mg Oral BID   dorzolamide-timolol  1 drop Right Eye BID   ferric citrate  420 mg Oral TID WC   gabapentin  100 mg Oral Daily   heparin  5,000 Units Subcutaneous Q8H   latanoprost  1 drop Right Eye QHS   melatonin  5 mg Oral QHS   metroNIDAZOLE  500 mg Oral Q12H   midodrine  10 mg Oral TID WC   multivitamin  1 tablet Oral QHS   nystatin  5 mL Oral QID   pantoprazole  40 mg Oral Q1200   pilocarpine  1 drop Right Eye QID   senna-docusate  1 tablet Oral QHS   sevelamer carbonate  1,600 mg Oral TID WC   sodium chloride flush  3 mL Intravenous Q12H    Dialysis Orders: [place] MWF  4h  400/600  66kg 2/2 bath  P2  AVF  Hep none  - Sensipar 60 mg PO q HD  -  Mircera 58mcg Iv q 2 weeks   Assessment/Plan: 1. Hypotension: On admit. Does not appear septic.  BC NGTD. Given fluid bolus and started on midodrine 10mg  TID with improvement. 2. Acute cholecystitis: S/p perc chole tube 1/12-1/27, then replaced 1/24 - current. On broad spectrum antibiotics. Per PMD. 3. ESRD: Usual MWF schedule - Next HD tomorrow. 3. Volume: Does not appear volume overloaded despite worsening anasarca and pleural effusion noted on CT. Hx R thoracentesis 1/28 with 1.1L removed. BP limiting UF, likely weight loss with recent illness, continue to titrate down volume as tolerated.  Lower dry weight as able.  No weights noted during admission.  4. Anemia: last Hgb 8 - last Aranesp on 2/1 per notes, next dose due to on 04/12/21 - ordered. 5. Secondary hyperparathyroidism: VDRA recently d/c'd d/t hypercalcemia, Correct Ca high, use low Ca bath.  Phos ok. 6. Nutrition: Alb low, continue supplements.  Renal diet w/fluid restrictions. 7. Parkinson's disease  8. Blindness/glaucoma 9. 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  Needs CT/MRI to eval once acute issues resolve 10. Severe aortic stenosis s/p TAVR, PAF -stable. Not on anticoagulation 11. Acute urinary retention w/foley - no outpatient F/u with urology yet.  Plan for voiding trial.  Jen Mow, PA-C [facility] 04/11/2021,11:21 AM  LOS: 4 days

## 2021-04-11 NOTE — Progress Notes (Signed)
Dr Sloan Leiter notified of patient's agitation this am, refusing care and meds. MD at bedside. Patient reported pain and Dr Sloan Leiter asked to administer Tylenol. AM cares started.

## 2021-04-12 DIAGNOSIS — R410 Disorientation, unspecified: Secondary | ICD-10-CM | POA: Diagnosis not present

## 2021-04-12 LAB — RENAL FUNCTION PANEL
Albumin: 1.9 g/dL — ABNORMAL LOW (ref 3.5–5.0)
Anion gap: 14 (ref 5–15)
BUN: 38 mg/dL — ABNORMAL HIGH (ref 8–23)
CO2: 20 mmol/L — ABNORMAL LOW (ref 22–32)
Calcium: 9 mg/dL (ref 8.9–10.3)
Chloride: 101 mmol/L (ref 98–111)
Creatinine, Ser: 7.48 mg/dL — ABNORMAL HIGH (ref 0.61–1.24)
GFR, Estimated: 7 mL/min — ABNORMAL LOW (ref >60)
Glucose, Bld: 158 mg/dL — ABNORMAL HIGH (ref 70–99)
Phosphorus: 4.3 mg/dL (ref 2.5–4.6)
Potassium: 3.4 mmol/L — ABNORMAL LOW (ref 3.5–5.1)
Sodium: 135 mmol/L (ref 135–145)

## 2021-04-12 LAB — CBC
HCT: 27 % — ABNORMAL LOW (ref 39.0–52.0)
Hemoglobin: 9.3 g/dL — ABNORMAL LOW (ref 13.0–17.0)
MCH: 34.6 pg — ABNORMAL HIGH (ref 26.0–34.0)
MCHC: 34.4 g/dL (ref 30.0–36.0)
MCV: 100.4 fL — ABNORMAL HIGH (ref 80.0–100.0)
Platelets: 346 10*3/uL (ref 150–400)
RBC: 2.69 MIL/uL — ABNORMAL LOW (ref 4.22–5.81)
RDW: 17.3 % — ABNORMAL HIGH (ref 11.5–15.5)
WBC: 12.3 10*3/uL — ABNORMAL HIGH (ref 4.0–10.5)
nRBC: 0.3 % — ABNORMAL HIGH (ref 0.0–0.2)

## 2021-04-12 LAB — GLUCOSE, CAPILLARY
Glucose-Capillary: 105 mg/dL — ABNORMAL HIGH (ref 70–99)
Glucose-Capillary: 127 mg/dL — ABNORMAL HIGH (ref 70–99)
Glucose-Capillary: 95 mg/dL (ref 70–99)

## 2021-04-12 MED ORDER — PANTOPRAZOLE SODIUM 40 MG PO TBEC
40.0000 mg | DELAYED_RELEASE_TABLET | Freq: Two times a day (BID) | ORAL | Status: DC
  Administered 2021-04-12 – 2021-04-13 (×2): 40 mg via ORAL
  Filled 2021-04-12 (×2): qty 1

## 2021-04-12 MED ORDER — ALBUMIN HUMAN 25 % IV SOLN
INTRAVENOUS | Status: AC
  Administered 2021-04-12: 25 g
  Filled 2021-04-12: qty 100

## 2021-04-12 MED ORDER — POTASSIUM CHLORIDE CRYS ER 20 MEQ PO TBCR
40.0000 meq | EXTENDED_RELEASE_TABLET | Freq: Once | ORAL | Status: AC
  Administered 2021-04-12: 40 meq via ORAL
  Filled 2021-04-12: qty 2

## 2021-04-12 NOTE — Progress Notes (Addendum)
[facility] Progress Note   Subjective:   Patient seen and examined at bedside in dialysis.  Tolerating well so far.  Denies CP, SOB, abdominal pain and nausea/vomiting/diarrhea.   Objective Vitals:   04/12/21 0743 04/12/21 0751 04/12/21 0800 04/12/21 0815  BP: (!) 107/51 (!) 108/53    Pulse: 66 64 (!) 58 60  Resp: 13 15 13 13   Temp: 98.7 F (37.1 C)     TempSrc: Oral     SpO2: 100% 100% 100% 99%  Weight: 64.4 kg      Physical Exam General:chronically ill appearing, blind male in NAD Heart:RRR, no mrg Lungs:nml WOB on RA Abdomen:soft, NTND Extremities:no LE edema, L BKA Dialysis Access: LU AVF cannulated   Filed Weights   04/12/21 0506 04/12/21 0743  Weight: 64.2 kg 64.4 kg    Intake/Output Summary (Last 24 hours) at 04/12/2021 0838 Last data filed at 04/11/2021 1800 Gross per 24 hour  Intake 355 ml  Output --  Net 355 ml    Additional Objective Labs: Basic Metabolic Panel: Recent Labs  Lab 04/09/21 0209 04/10/21 0257 04/12/21 0123  NA 135 132* 135  K 3.6 3.6 3.4*  CL 100 99 101  CO2 20* 18* 20*  GLUCOSE 128* 98 158*  BUN 38* 43* 38*  CREATININE 7.09* 8.13* 7.48*  CALCIUM 8.5* 8.7* 9.0  PHOS 3.9  --  4.3   Liver Function Tests: Recent Labs  Lab 04/08/21 0133 04/09/21 0209 04/10/21 0257 04/12/21 0123  AST 13*  --  12*  --   ALT <5  --  5  --   ALKPHOS 152*  --  120  --   BILITOT 0.5  --  0.6  --   PROT 5.1*  --  5.1*  --   ALBUMIN 1.7* 1.6* 1.6* 1.9*   CBC: Recent Labs  Lab 04/07/21 1251 04/08/21 0133 04/09/21 0209 04/10/21 0257 04/12/21 0437  WBC 16.2* 13.7* 12.7* 13.5* 12.3*  HGB 9.2* 7.9* 8.0* 8.0* 9.3*  HCT 27.3* 24.5* 24.4* 23.7* 27.0*  MCV 103.8* 103.8* 103.4* 102.6* 100.4*  PLT 411* 398 373 363 346   CBG: Recent Labs  Lab 04/11/21 1334 04/11/21 1345 04/11/21 1517 04/11/21 1643 04/11/21 2114  GLUCAP 55* 63* 140* 156* 146*    Medications:  sodium chloride      (feeding supplement) PROSource Plus  30 mL  Oral BID BM   atorvastatin  10 mg Oral q AM   brimonidine  1 drop Right Eye TID   carbidopa-levodopa  1 tablet Oral TID   Chlorhexidine Gluconate Cloth  6 each Topical Q0600   cinacalcet  60 mg Oral Q M,W,F-HD   clopidogrel  75 mg Oral Q breakfast   [START ON 04/13/2021] darbepoetin (ARANESP) injection - DIALYSIS  200 mcg Intravenous Q Thu-HD   docusate sodium  100 mg Oral BID   dorzolamide-timolol  1 drop Right Eye BID   ferric citrate  420 mg Oral TID WC   gabapentin  100 mg Oral Daily   heparin  5,000 Units Subcutaneous Q8H   latanoprost  1 drop Right Eye QHS   melatonin  5 mg Oral QHS   metroNIDAZOLE  500 mg Oral Q12H   midodrine  10 mg Oral TID WC   multivitamin  1 tablet Oral QHS   nystatin  5 mL Oral QID   pantoprazole  40 mg Oral Q1200   pilocarpine  1 drop Right Eye QID   potassium chloride  40 mEq Oral Once  senna-docusate  1 tablet Oral QHS   sevelamer carbonate  1,600 mg Oral TID WC   sodium chloride flush  3 mL Intravenous Q12H    Dialysis Orders: [place] MWF  4h  400/600  66kg 2/2 bath  P2  AVF  Hep none  - Sensipar 60 mg PO q HD  - Mircera 57mcg Iv q 2 weeks   Assessment/Plan: 1. Hypotension: On admit. Does not appear septic.  BC NGTD. Given fluid bolus and started on midodrine 10mg  TID with improvement. 2. Acute cholecystitis: S/p perc chole tube 1/12-1/27, then replaced 1/24 - current. Completed antibiotic course. Per PMD. 3. ESRD: Usual MWF schedule - Next HD today. 3. Volume: Does not appear volume overloaded despite worsening anasarca and pleural effusion noted on CT. Hx R thoracentesis 1/28 with 1.1L removed. BP limiting UF, likely weight loss with recent illness, continue to titrate down volume as tolerated.  Lower dry weight as able. Under dry weight per today's bed weight.  4. Anemia: last Hgb^9.3 - last Aranesp on 2/1 per notes, next dose due to on 04/12/21 - ordered. 5. Secondary hyperparathyroidism: VDRA recently d/c'd d/t hypercalcemia, Correct Ca high,  use low Ca bath.  Phos ok. 6. Nutrition: Alb low, continue supplements.  Renal diet w/fluid restrictions. 7. Parkinson's disease  8. Blindness/glaucoma 9. 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  Needs CT/MRI to eval once acute issues resolve 10. Severe aortic stenosis s/p TAVR, PAF -stable. Not on anticoagulation 11. Acute urinary retention w/foley - no outpatient F/u with urology yet.  Foley removed.   Jen Mow, PA-C [facility] 04/12/2021,8:38 AM  LOS: 5 days

## 2021-04-12 NOTE — Assessment & Plan Note (Addendum)
Has had some waxing and waning confusion over the past few days-this morning he appears appropriate-and is requesting that he be discharged back to skilled nursing facility.  Continue to maintain delirium precautions-suspect he will probably have better mental status once he is back to his family surroundings.

## 2021-04-12 NOTE — Assessment & Plan Note (Addendum)
DNR in place.  At baseline-elderly frail patient on HD with ESRD-is s/p left BKA and resident of SNF-he has had numerous hospitalizations recently due to acute cholecystitis and hypotension.  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  Family agreeable to have hospice follow him at [facility].

## 2021-04-12 NOTE — Plan of Care (Signed)

## 2021-04-12 NOTE — Progress Notes (Signed)
Elijah White, Pt's daughter called and spoke with RN. RN updated Mrs. Foster on pt overnight condition.

## 2021-04-12 NOTE — Progress Notes (Addendum)
PROGRESS NOTE        PATIENT DETAILS Name: Elijah White Age: 81 y.o. Sex: male Date of Birth: 11-02-1940 Admit Date: 04/07/2021 Admitting Physician Orma Flaming, MD WUJ:[id number], Beatriz Chancellor, MD  Brief Summary: Patient is a 81 y.o.  male with history of ESRD on HD MWF, PAD-s/p left BKA, HTN, DM-2-with 2 recent hospitalizations for acute cholecystitis requiring cholecystostomy tube placement-referred to the ED from HD center for hypotension.  See below for further details  Significant Hospital events: 1/11-1/22>> hospitalized for sepsis due to acute cholecystitis-underwent cholecystostomy drain placement on 1/12-drain pulled out on 1/17-monitored-stabilized on abx and discharged to SNF without cholecystostomy drain 1/23-2/1>> readmitted for severe abdominal pain-cholecystostomy tube placed on 1/24-discharge back to SNF 2/4>> readmitted for hypotension/RUQ pain  Significant imaging studies: 2/3>> CT abdomen/pelvis: Per cholecystostomy in place-but persistent distention of the gallbladder, progressive anasarca, 2.5 cm left adrenal nodule  Significant microbiology data: 2/3>> COVID/flu PCR: Negative 2/3>> blood cultures: Negative  Procedures: None  Consults:  PCCM, Renal, IR  Subjective: Seen twice today-earlier this morning he was in dialysis sleepy but answering simple questions appropriately, seen earlier this afternoon-answering questions appropriately-only complaint is that he has soiled himself and the nurses were about to clean him up.  Objective: Vitals: Blood pressure (!) 113/55, pulse 80, temperature 98.3 F (36.8 C), temperature source Oral, resp. rate (!) 22, weight 64.4 kg, SpO2 96 %.   Exam: Gen Exam:Alert awake-not in any distress HEENT:atraumatic, normocephalic Chest: B/L clear to auscultation anteriorly CVS:S1S2 regular Abdomen:soft non tender, non distended Extremities: Left AKA Neurology: Non focal Skin: no rash   Pertinent  Labs/Radiology: CBC Latest Ref Rng & Units 04/12/2021 04/10/2021 04/09/2021  WBC 4.0 - 10.5 K/uL 12.3(H) 13.5(H) 12.7(H)  Hemoglobin 13.0 - 17.0 g/dL 9.3(L) 8.0(L) 8.0(L)  Hematocrit 39.0 - 52.0 % 27.0(L) 23.7(L) 24.4(L)  Platelets 150 - 400 K/uL 346 363 373    Lab Results  Component Value Date   NA 135 04/12/2021   K 3.4 (L) 04/12/2021   CL 101 04/12/2021   CO2 20 (L) 04/12/2021      Assessment/Plan: * Hypotension- (present on admission) Suspect multifactorial etiology-hypovolemia-smoldering sepsis from ongoing cholecystitis, fluid shift with hemodialysis and autonomic dysfunction associated with Parkinson's disease.  BP stable over the past several days on midodrine.  Tolerated HD this morning without any major issues.  Acute cholecystitis- (present on admission) Appreciate general surgical input-cholecystostomy drain in place-completed antimicrobial therapy on 2/7.  Abdominal exam remains benign.    Delirium Noted to be confused yesterday-seems to be waxing and waning-this morning he appears much more coherent/alert.  Nonfocal exam.  Electrolytes stable.  Has not received any benzos/narcotics in the past day or so.  Continue supportive care-maintain delirium precautions.  Family concerned that if he has delirium-and gets discharged back to SNF-he will likely be sent to back.  But-he will likely be better if he is back in his family surroundings.  We will reassess tomorrow.  Pneumonia during recent hospitalization - (present on admission) Suspect sympathetic/PNA from neighboring cholecystitis-already treated during prior hospitalization-see above regarding antibiotics.    Anemia in chronic kidney disease- (present on admission) No evidence of blood loss-defer Aranesp/iron to nephrology  ESRD (end stage renal disease) ([place])- (present on admission) Nephrology following for HD  Acute urinary retention with foley - (present on admission) At hospitalization on 7/82 he had complicated UTI,  urinary retention and hydronephrosis requiring foley placement.  Urology follow-up in the outpatient setting was recommended-he has not yet seen yet.  At family's request-Foley catheter was discontinued on 2/5-Per nursing staff-he is oliguric at baseline and is voiding very minimally.  Continue to watch closely-bladder scan on 2/7 did not show any acute retention   Paroxysmal atrial fibrillation (HCC)- (present on admission) Maintaining sinus rhythm-not on anticoagulation due to severity of anemia/concern for GI bleeding and advanced age.  EGD last month was stable.  Resume metoprolol when BP permits  Severe aortic stenosis s/p TAVR  Stable. 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  Encourage oral intake-allow permissive hyperglycemia-not a candidate for tight glycemic control.    Recent Labs    04/11/21 1643 04/11/21 2114 04/12/21 1238  GLUCAP 156* 146* 105*    GERD (gastroesophageal reflux disease)/esophageal candidiasis - (present on admission) Continue protonix.  S/p EGD 03/2021 during hospitalization showed chronic gastritis-suspicion for Candida-completed therapy with Diflucan  Blindness/glaucoma  Continue with eye drops.  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  Parkinson's disease ([place])- (present on admission) Followed by neurology in the outpatient setting-continue Sinemet  Palliative care encounter DNR in place.  At baseline-elderly frail patient on HD with ESRD-is s/p left BKA and resident of SNF-he has had numerous hospitalizations recently due to acute cholecystitis and hypotension.  He lately has been having some delirium.  Long discussion with daughter/son over the phone-explained that patient has received appropriate treatment for his  underlying issues-and that he remains a risk for for acute decompensation/hospitalization-and that there is unlikely any intervention that can be done to minimize this risk.  Family is very understanding-family aware that if patient deteriorates significantly-we may need to initiate hospice care.  Family will reengage with patient regarding goals of care discussion etc.  I will follow-up with family again tomorrow.  2.5 cm left adrenal nodule- (present on admission) Radiology recommending dedicated CT/MRI examination once acute issues have resolved-we will defer to patient's primary care practitioner.  Pressure injury of skin- (present on admission) Pressure Injury 03/28/21 Coccyx Medial Stage 2 -  Partial thickness loss of dermis presenting as a shallow open injury with a red, pink wound bed without slough. wound bed is pink, no drainage noted (Active)  03/28/21 1028  Location: Coccyx  Location Orientation: Medial  Staging: Stage 2 -  Partial thickness loss of dermis presenting as a shallow open injury with a red, pink wound bed without slough.  Wound Description (Comments): wound bed is pink, no drainage noted  Present on Admission: Yes       Sepsis (HCC)-resolved as of 04/07/2021, (present on admission) 81 year old presenting from dialysis with hypotension and right sided chest pain found to meet sepsis criteria with elevated WBC and hypotension   BMI: Estimated body mass index is 23.63 kg/m as calculated from the following:   Height as of 03/28/21: 5\' 5"  (1.651 m).   Weight as of this encounter: 64.4 kg.   Code status:   Code Status: DNR   DVT Prophylaxis: heparin injection 5,000 Units Start: 04/07/21 2200   Family Communication: Daughter-[name] Foster-[phone] updated on 2/8   Disposition Plan: Status is: Inpatient Remains inpatient appropriate because: Monitor delirium-once better-back to SNF-has had recurrent hospitalizations-family feels that if he continues  to have  lingering delirium-he will likely be rehospitalized from SNF.   Planned Discharge Destination:Skilled nursing facility   Diet: Diet Order             Diet renal with fluid restriction Fluid restriction: 1200 mL Fluid; Room service appropriate? Yes; Fluid consistency: Thin  Diet effective now                     Antimicrobial agents: Anti-infectives (From admission, onward)    Start     Dose/Rate Route Frequency Ordered Stop   04/11/21 1015  ceFEPIme (MAXIPIME) 2 g in sodium chloride 0.9 % 100 mL IVPB        2 g 200 mL/hr over 30 Minutes Intravenous STAT 04/11/21 0928 04/11/21 1151   04/10/21 2200  metroNIDAZOLE (FLAGYL) tablet 500 mg        500 mg Oral Every 12 hours 04/10/21 1249 04/12/21 0959   04/10/21 1300  ceFEPIme (MAXIPIME) 2 g in sodium chloride 0.9 % 100 mL IVPB  Status:  Discontinued        2 g 200 mL/hr over 30 Minutes Intravenous Every M-W-F (Hemodialysis) 04/10/21 1245 04/11/21 0928   04/10/21 1200  vancomycin (VANCOREADY) IVPB 750 mg/150 mL  Status:  Discontinued        750 mg 150 mL/hr over 60 Minutes Intravenous Every M-W-F (Hemodialysis) 04/07/21 1431 04/10/21 1046   04/08/21 1600  ceFEPIme (MAXIPIME) 1 g in sodium chloride 0.9 % 100 mL IVPB  Status:  Discontinued        1 g 200 mL/hr over 30 Minutes Intravenous Every 24 hours 04/07/21 1430 04/10/21 1245   04/08/21 1530  fluconazole (DIFLUCAN) tablet 100 mg        100 mg Oral Daily 04/08/21 1431 04/08/21 1536   04/07/21 2000  metroNIDAZOLE (FLAGYL) IVPB 500 mg  Status:  Discontinued        500 mg 100 mL/hr over 60 Minutes Intravenous Every 12 hours 04/07/21 1830 04/10/21 1249   04/07/21 1430  ceFEPIme (MAXIPIME) 2 g in sodium chloride 0.9 % 100 mL IVPB        2 g 200 mL/hr over 30 Minutes Intravenous  Once 04/07/21 1429 04/07/21 1658   04/07/21 1430  vancomycin (VANCOREADY) IVPB 1500 mg/300 mL        1,500 mg 150 mL/hr over 120 Minutes Intravenous  Once 04/07/21 1429 04/07/21 2052         MEDICATIONS: Scheduled Meds:  (feeding supplement) PROSource Plus  30 mL Oral BID BM   atorvastatin  10 mg Oral q AM   brimonidine  1 drop Right Eye TID   carbidopa-levodopa  1 tablet Oral TID   Chlorhexidine Gluconate Cloth  6 each Topical Q0600   cinacalcet  60 mg Oral Q M,W,F-HD   clopidogrel  75 mg Oral Q breakfast   [START ON 04/13/2021] darbepoetin (ARANESP) injection - DIALYSIS  200 mcg Intravenous Q Thu-HD   docusate sodium  100 mg Oral BID   dorzolamide-timolol  1 drop Right Eye BID   ferric citrate  420 mg Oral TID WC   gabapentin  100 mg Oral Daily   heparin  5,000 Units Subcutaneous Q8H   latanoprost  1 drop Right Eye QHS   melatonin  5 mg Oral QHS   midodrine  10 mg Oral TID WC   multivitamin  1 tablet Oral QHS   nystatin  5 mL Oral QID   pantoprazole  40 mg Oral Q1200  pilocarpine  1 drop Right Eye QID   senna-docusate  1 tablet Oral QHS   sevelamer carbonate  1,600 mg Oral TID WC   sodium chloride flush  3 mL Intravenous Q12H   Continuous Infusions:  sodium chloride     PRN Meds:.sodium chloride, acetaminophen **OR** acetaminophen, hypromellose, ondansetron **OR** ondansetron (ZOFRAN) IV, sodium chloride flush   I have personally reviewed following labs and imaging studies  LABORATORY DATA: CBC: Recent Labs  Lab 04/07/21 1251 04/08/21 0133 04/09/21 0209 04/10/21 0257 04/12/21 0437  WBC 16.2* 13.7* 12.7* 13.5* 12.3*  HGB 9.2* 7.9* 8.0* 8.0* 9.3*  HCT 27.3* 24.5* 24.4* 23.7* 27.0*  MCV 103.8* 103.8* 103.4* 102.6* 100.4*  PLT 411* 398 373 363 425    Basic Metabolic Panel: Recent Labs  Lab 04/07/21 1251 04/08/21 0133 04/09/21 0209 04/10/21 0257 04/12/21 0123  NA 132* 135 135 132* 135  K 3.7 3.7 3.6 3.6 3.4*  CL 100 102 100 99 101  CO2 20* 20* 20* 18* 20*  GLUCOSE 176* 139* 128* 98 158*  BUN 28* 29* 38* 43* 38*  CREATININE 6.00* 6.39* 7.09* 8.13* 7.48*  CALCIUM 8.7* 8.6* 8.5* 8.7* 9.0  PHOS  --   --  3.9  --  4.3    GFR: Estimated  Creatinine Clearance: 6.6 mL/min (A) (by C-G formula based on SCr of 7.48 mg/dL (H)).  Liver Function Tests: Recent Labs  Lab 04/08/21 0133 04/09/21 0209 04/10/21 0257 04/12/21 0123  AST 13*  --  12*  --   ALT <5  --  5  --   ALKPHOS 152*  --  120  --   BILITOT 0.5  --  0.6  --   PROT 5.1*  --  5.1*  --   ALBUMIN 1.7* 1.6* 1.6* 1.9*   No results for input(s): LIPASE, AMYLASE in the last 168 hours. No results for input(s): AMMONIA in the last 168 hours.  Coagulation Profile: Recent Labs  Lab 04/07/21 1251  INR 0.9    Cardiac Enzymes: No results for input(s): CKTOTAL, CKMB, CKMBINDEX, TROPONINI in the last 168 hours.  BNP (last 3 results) No results for input(s): PROBNP in the last 8760 hours.  Lipid Profile: No results for input(s): CHOL, HDL, LDLCALC, TRIG, CHOLHDL, LDLDIRECT in the last 72 hours.  Thyroid Function Tests: No results for input(s): TSH, T4TOTAL, FREET4, T3FREE, THYROIDAB in the last 72 hours.  Anemia Panel: No results for input(s): VITAMINB12, FOLATE, FERRITIN, TIBC, IRON, RETICCTPCT in the last 72 hours.  Urine analysis:    Component Value Date/Time   COLORURINE YELLOW 03/27/2021 1130   APPEARANCEUR HAZY (A) 03/27/2021 1130   LABSPEC 1.008 03/27/2021 1130   PHURINE 8.0 03/27/2021 1130   GLUCOSEU 50 (A) 03/27/2021 1130   HGBUR MODERATE (A) 03/27/2021 1130   BILIRUBINUR NEGATIVE 03/27/2021 1130   BILIRUBINUR negative 02/19/2018 1643   KETONESUR NEGATIVE 03/27/2021 1130   PROTEINUR 100 (A) 03/27/2021 1130   UROBILINOGEN 0.2 02/19/2018 1643   UROBILINOGEN 1.0 08/22/2012 1402   NITRITE NEGATIVE 03/27/2021 1130   LEUKOCYTESUR LARGE (A) 03/27/2021 1130    Sepsis Labs: Lactic Acid, Venous    Component Value Date/Time   LATICACIDVEN 1.8 04/07/2021 2237    MICROBIOLOGY: Recent Results (from the past 240 hour(s))  MRSA Next Gen by PCR, Nasal     Status: None   Collection Time: 04/03/21  9:50 AM   Specimen: Nasal Mucosa; Nasal Swab  Result  Value Ref Range Status   MRSA by PCR Next Gen  NOT DETECTED NOT DETECTED Final    Comment: (NOTE) The GeneXpert MRSA Assay (FDA approved for NASAL specimens only), is one component of a comprehensive MRSA colonization surveillance program. It is not intended to diagnose MRSA infection nor to guide or monitor treatment for MRSA infections. Test performance is not FDA approved in patients less than 47 years old. Performed at [facility], [place] [address], [place], [place] [address]   Resp Panel by RT-PCR (Flu A&B, Covid) Nasopharyngeal Swab     Status: None   Collection Time: 04/05/21  9:59 AM   Specimen: Nasopharyngeal Swab; Nasopharyngeal(NP) swabs in vial transport medium  Result Value Ref Range Status   SARS Coronavirus 2 by RT PCR NEGATIVE NEGATIVE Final    Comment: (NOTE) SARS-CoV-2 target nucleic acids are NOT DETECTED.  The SARS-CoV-2 RNA is generally detectable in upper respiratory specimens during the acute phase of infection. The lowest concentration of SARS-CoV-2 viral copies this assay can detect is 138 copies/mL. A negative result does not preclude SARS-Cov-2 infection and should not be used as the sole basis for treatment or other patient management decisions. A negative result may occur with  improper specimen collection/handling, submission of specimen other than nasopharyngeal swab, presence of viral mutation(s) within the areas targeted by this assay, and inadequate number of viral copies(<138 copies/mL). A negative result must be combined with clinical observations, patient history, and epidemiological information. The expected result is Negative.  Fact Sheet for Patients:  EntrepreneurPulse.com.au  Fact Sheet for Healthcare Providers:  IncredibleEmployment.be  This test is no t yet approved or cleared by the [place] FDA and  has been authorized for detection and/or diagnosis of SARS-CoV-2 by FDA under an Emergency  Use Authorization (EUA). This EUA will remain  in effect (meaning this test can be used) for the duration of the COVID-19 declaration under Section 564(b)(1) of the Act, 21 U.S.C.section 360bbb-3(b)(1), unless the authorization is terminated  or revoked sooner.       Influenza A by PCR NEGATIVE NEGATIVE Final   Influenza B by PCR NEGATIVE NEGATIVE Final    Comment: (NOTE) The Xpert Xpress SARS-CoV-2/FLU/RSV plus assay is intended as an aid in the diagnosis of influenza from Nasopharyngeal swab specimens and should not be used as a sole basis for treatment. Nasal washings and aspirates are unacceptable for Xpert Xpress SARS-CoV-2/FLU/RSV testing.  Fact Sheet for Patients: EntrepreneurPulse.com.au  Fact Sheet for Healthcare Providers: IncredibleEmployment.be  This test is not yet approved or cleared by the [place] FDA and has been authorized for detection and/or diagnosis of SARS-CoV-2 by FDA under an Emergency Use Authorization (EUA). This EUA will remain in effect (meaning this test can be used) for the duration of the COVID-19 declaration under Section 564(b)(1) of the Act, 21 U.S.C. section 360bbb-3(b)(1), unless the authorization is terminated or revoked.  Performed at [facility], [place] [address] [place], [place] [address]   MRSA Next Gen by PCR, Nasal     Status: None   Collection Time: 04/07/21  2:30 PM   Specimen: Nasopharyngeal Swab; Nasal Swab  Result Value Ref Range Status   MRSA by PCR Next Gen NOT DETECTED NOT DETECTED Final    Comment: (NOTE) The GeneXpert MRSA Assay (FDA approved for NASAL specimens only), is one component of a comprehensive MRSA colonization surveillance program. It is not intended to diagnose MRSA infection nor to guide or monitor treatment for MRSA infections. Test performance is not FDA approved in patients less than 2 years  old. Performed at [facility], [place] [address]  [place], [place] [address]   Resp Panel by RT-PCR (Flu A&B, Covid) Nasopharyngeal Swab     Status: None   Collection Time: 04/07/21  3:02 PM   Specimen: Nasopharyngeal Swab; Nasopharyngeal(NP) swabs in vial transport medium  Result Value Ref Range Status   SARS Coronavirus 2 by RT PCR NEGATIVE NEGATIVE Final    Comment: (NOTE) SARS-CoV-2 target nucleic acids are NOT DETECTED.  The SARS-CoV-2 RNA is generally detectable in upper respiratory specimens during the acute phase of infection. The lowest concentration of SARS-CoV-2 viral copies this assay can detect is 138 copies/mL. A negative result does not preclude SARS-Cov-2 infection and should not be used as the sole basis for treatment or other patient management decisions. A negative result may occur with  improper specimen collection/handling, submission of specimen other than nasopharyngeal swab, presence of viral mutation(s) within the areas targeted by this assay, and inadequate number of viral copies(<138 copies/mL). A negative result must be combined with clinical observations, patient history, and epidemiological information. The expected result is Negative.  Fact Sheet for Patients:  EntrepreneurPulse.com.au  Fact Sheet for Healthcare Providers:  IncredibleEmployment.be  This test is no t yet approved or cleared by the [place] FDA and  has been authorized for detection and/or diagnosis of SARS-CoV-2 by FDA under an Emergency Use Authorization (EUA). This EUA will remain  in effect (meaning this test can be used) for the duration of the COVID-19 declaration under Section 564(b)(1) of the Act, 21 U.S.C.section 360bbb-3(b)(1), unless the authorization is terminated  or revoked sooner.       Influenza A by PCR NEGATIVE NEGATIVE Final   Influenza B by PCR NEGATIVE NEGATIVE Final    Comment: (NOTE) The Xpert Xpress SARS-CoV-2/FLU/RSV plus assay is intended as an aid in the diagnosis of  influenza from Nasopharyngeal swab specimens and should not be used as a sole basis for treatment. Nasal washings and aspirates are unacceptable for Xpert Xpress SARS-CoV-2/FLU/RSV testing.  Fact Sheet for Patients: EntrepreneurPulse.com.au  Fact Sheet for Healthcare Providers: IncredibleEmployment.be  This test is not yet approved or cleared by the [place] FDA and has been authorized for detection and/or diagnosis of SARS-CoV-2 by FDA under an Emergency Use Authorization (EUA). This EUA will remain in effect (meaning this test can be used) for the duration of the COVID-19 declaration under Section 564(b)(1) of the Act, 21 U.S.C. section 360bbb-3(b)(1), unless the authorization is terminated or revoked.  Performed at [facility], [place] [address] [place], [place] [address]   Culture, blood (routine x 2)     Status: None (Preliminary result)   Collection Time: 04/08/21  1:33 AM   Specimen: BLOOD  Result Value Ref Range Status   Specimen Description BLOOD RIGHT ANTECUBITAL  Final   Special Requests AEROBIC BOTTLE ONLY Blood Culture adequate volume  Final   Culture   Final    NO GROWTH 4 DAYS Performed at [facility], [place] [address], [place],  [address]    Report Status PENDING  Incomplete  Culture, blood (routine x 2)     Status: None (Preliminary result)   Collection Time: 04/08/21  1:33 AM   Specimen: BLOOD RIGHT HAND  Result Value Ref Range Status   Specimen Description BLOOD RIGHT HAND  Final   Special Requests AEROBIC BOTTLE ONLY Blood Culture adequate volume  Final   Culture   Final    NO GROWTH 4 DAYS Performed  at [facility], [place] [address] [place],  [address]    Report Status PENDING  Incomplete    RADIOLOGY STUDIES/RESULTS: No results found.   LOS: 5 days   Oren Binet, MD  [facility]    To contact the attending provider between 7A-7P or the covering provider during  after hours 7P-7A, please log into the web site [email or web address] and access using universal [place] password for that web site. If you do not have the password, please call the hospital operator.  04/12/2021, 3:35 PM

## 2021-04-13 LAB — GLUCOSE, CAPILLARY
Glucose-Capillary: 76 mg/dL (ref 70–99)
Glucose-Capillary: 85 mg/dL (ref 70–99)

## 2021-04-13 LAB — CULTURE, BLOOD (ROUTINE X 2)
Culture: NO GROWTH
Culture: NO GROWTH
Special Requests: ADEQUATE
Special Requests: ADEQUATE

## 2021-04-13 LAB — RESP PANEL BY RT-PCR (FLU A&B, COVID) ARPGX2
Influenza A by PCR: NEGATIVE
Influenza B by PCR: NEGATIVE
SARS Coronavirus 2 by RT PCR: NEGATIVE

## 2021-04-13 MED ORDER — PROSOURCE PLUS PO LIQD: 30.0000 mL | Freq: Two times a day (BID) | ORAL | Status: DC

## 2021-04-13 MED ORDER — PANTOPRAZOLE SODIUM 40 MG PO TBEC: 40.0000 mg | DELAYED_RELEASE_TABLET | Freq: Two times a day (BID) | ORAL | Status: AC

## 2021-04-13 MED ORDER — DARBEPOETIN ALFA 200 MCG/0.4ML IJ SOSY: 200.0000 ug | PREFILLED_SYRINGE | INTRAMUSCULAR | Status: DC

## 2021-04-13 MED ORDER — MIDODRINE HCL 10 MG PO TABS: 10.0000 mg | ORAL_TABLET | Freq: Three times a day (TID) | ORAL | Status: DC

## 2021-04-13 MED ORDER — ACETAMINOPHEN 500 MG PO TABS: 1000.0000 mg | ORAL_TABLET | Freq: Three times a day (TID) | ORAL | 0 refills | Status: AC | PRN

## 2021-04-13 MED ORDER — BACLOFEN 10 MG PO TABS: 5.0000 mg | ORAL_TABLET | Freq: Three times a day (TID) | ORAL | 1 refills | Status: DC | PRN

## 2021-04-13 NOTE — Progress Notes (Signed)
[facility] Progress Note   Subjective:   Patient seen and examined at bedside.  No specific complaints.  States he is going home today, happy about that. Denies CP, SOB, abdominal pain and n/v/d.  Ate "2 bites" of his breakfast this AM, says he wasn't hungry.   Objective Vitals:   04/13/21 0400 04/13/21 0500 04/13/21 0744 04/13/21 1127  BP: (!) 100/53  137/66 (!) 105/52  Pulse: 61  69 68  Resp: 18  16 17   Temp: 98.3 F (36.8 C)  98.7 F (37.1 C) 98.3 F (36.8 C)  TempSrc: Oral  Oral Oral  SpO2: 96%  98% 96%  Weight:  67.8 kg     Physical Exam General:chronically ill appearing male in NAD Heart:RRR, no mrg Lungs:CTAB, nml WOB  Abdomen:soft, NTND, drain in place Extremities:no LE edema, L BKA Dialysis Access: LU AVF +b/t  Filed Weights   04/12/21 0506 04/12/21 0743 04/13/21 0500  Weight: 64.2 kg 64.4 kg 67.8 kg    Intake/Output Summary (Last 24 hours) at 04/13/2021 1209 Last data filed at 04/12/2021 1310 Gross per 24 hour  Intake 0 ml  Output 0 ml  Net 0 ml    Additional Objective Labs: Basic Metabolic Panel: Recent Labs  Lab 04/09/21 0209 04/10/21 0257 04/12/21 0123  NA 135 132* 135  K 3.6 3.6 3.4*  CL 100 99 101  CO2 20* 18* 20*  GLUCOSE 128* 98 158*  BUN 38* 43* 38*  CREATININE 7.09* 8.13* 7.48*  CALCIUM 8.5* 8.7* 9.0  PHOS 3.9  --  4.3   Liver Function Tests: Recent Labs  Lab 04/08/21 0133 04/09/21 0209 04/10/21 0257 04/12/21 0123  AST 13*  --  12*  --   ALT <5  --  5  --   ALKPHOS 152*  --  120  --   BILITOT 0.5  --  0.6  --   PROT 5.1*  --  5.1*  --   ALBUMIN 1.7* 1.6* 1.6* 1.9*    CBC: Recent Labs  Lab 04/07/21 1251 04/08/21 0133 04/09/21 0209 04/10/21 0257 04/12/21 0437  WBC 16.2* 13.7* 12.7* 13.5* 12.3*  HGB 9.2* 7.9* 8.0* 8.0* 9.3*  HCT 27.3* 24.5* 24.4* 23.7* 27.0*  MCV 103.8* 103.8* 103.4* 102.6* 100.4*  PLT 411* 398 373 363 346   Blood Culture    Component Value Date/Time   SDES BLOOD RIGHT ANTECUBITAL  04/08/2021 0133   SDES BLOOD RIGHT HAND 04/08/2021 0133   SPECREQUEST AEROBIC BOTTLE ONLY Blood Culture adequate volume 04/08/2021 0133   SPECREQUEST AEROBIC BOTTLE ONLY Blood Culture adequate volume 04/08/2021 0133   CULT  04/08/2021 0133    NO GROWTH 5 DAYS Performed at [facility], [place] [address] [place], [place] [address]    CULT  04/08/2021 0133    NO GROWTH 5 DAYS Performed at [facility], [place] [address], [place], [place] [address]    REPTSTATUS 04/13/2021 FINAL 04/08/2021 0133   REPTSTATUS 04/13/2021 FINAL 04/08/2021 0133    CBG: Recent Labs  Lab 04/12/21 1238 04/12/21 1702 04/12/21 2050 04/13/21 0748 04/13/21 1130  GLUCAP 105* 127* 95 76 85   Medications:  sodium chloride      (feeding supplement) PROSource Plus  30 mL Oral BID BM   atorvastatin  10 mg Oral q AM   brimonidine  1 drop Right Eye TID   carbidopa-levodopa  1 tablet Oral TID   Chlorhexidine Gluconate Cloth  6 each Topical Q0600   cinacalcet  60  mg Oral Q M,W,F-HD   clopidogrel  75 mg Oral Q breakfast   darbepoetin (ARANESP) injection - DIALYSIS  200 mcg Intravenous Q Thu-HD   docusate sodium  100 mg Oral BID   dorzolamide-timolol  1 drop Right Eye BID   ferric citrate  420 mg Oral TID WC   gabapentin  100 mg Oral Daily   heparin  5,000 Units Subcutaneous Q8H   latanoprost  1 drop Right Eye QHS   melatonin  5 mg Oral QHS   midodrine  10 mg Oral TID WC   multivitamin  1 tablet Oral QHS   nystatin  5 mL Oral QID   pantoprazole  40 mg Oral BID   pilocarpine  1 drop Right Eye QID   senna-docusate  1 tablet Oral QHS   sevelamer carbonate  1,600 mg Oral TID WC   sodium chloride flush  3 mL Intravenous Q12H    Dialysis Orders: [facility] MWF  4h  400/600  66kg 2/2 bath  P2  AVF  Hep none  - Sensipar 60 mg PO q HD  - Mircera 73mcg Iv q 2 weeks   Assessment/Plan: 1. Hypotension: On admit. Not septic.  BC NGTD. Given fluid bolus and started on midodrine 10mg  TID with improvement. 2.  Acute cholecystitis: S/p perc chole tube 1/12-1/27, then replaced 1/24 - current. Completed antibiotic course. Per PMD. 3. ESRD: Usual MWF schedule - Next HD tomorrow. 3. Volume: Does not appear volume overloaded despite worsening anasarca and pleural effusion noted on CT. Hx R thoracentesis 1/28 with 1.1L removed. BP limiting UF, likely weight loss with recent illness, continue to titrate down volume as tolerated ~ 65kg 4. Anemia: last Hgb^9.3 - last Aranesp on 2/1, not given with HD yesterday.  Will give as outpatient tomorrow. 5. Secondary hyperparathyroidism: VDRA recently d/c'd d/t hypercalcemia, Correct Ca high, use low Ca bath.  Phos ok. 6. Nutrition: Alb low, continue supplements.  Renal diet w/fluid restrictions. 7. Parkinson's disease  8. Blindness/glaucoma 9. 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  Needs CT/MRI to eval once acute issues resolve 10. Severe aortic stenosis s/p TAVR, PAF -stable. Not on anticoagulation 11. Acute urinary retention w/foley - no outpatient F/u with urology yet.  Foley removed.  Jen Mow, PA-C [facility] 04/13/2021,12:09 PM  LOS: 6 days

## 2021-04-13 NOTE — TOC Progression Note (Signed)
Transition of Care [facility]) - Progression Note    Patient Details  Name: Elijah White MRN: [id number] Date of Birth: 14-Aug-1940  Transition of Care [facility]) CM/SW [place], LCSW Phone Number: 04/13/2021, 9:54 AM  Clinical Narrative:    CSW spoke with patient's daughter regarding hospice referral. She is in agreement with referral to [facility] but requests that they contact her first because she has not been able to speak with her father in person yet and he has been too confused to discuss over the phone. CSW made [facility] and [facility] aware. Will schedule PTAR once COVID results return.    Expected Discharge Plan: [place] Barriers to Discharge: Continued Medical Work up  Expected Discharge Plan and Services Expected Discharge Plan: [place] In-house Referral: Clinical Social Work   Post Acute Care Choice: [place] Living arrangements for the past 2 months: [place] Expected Discharge Date: 04/13/21                                     Social Determinants of Health (SDOH) Interventions    Readmission Risk Interventions Readmission Risk Prevention Plan 04/10/2021 03/17/2021  Transportation Screening Complete Complete  Medication Review Press photographer) Complete -  PCP or Specialist appointment within 3-5 days of discharge Complete -  [place] or Home Care Consult Complete Complete  SW Recovery Care/Counseling Consult Complete Complete  Palliative Care Screening Not Applicable -  [place] Complete Complete  Some recent data might be hidden

## 2021-04-13 NOTE — Progress Notes (Signed)
Manufacturing engineer [facility])  Hospital Liaison: RN note         This patient is currently enrolled in our palliative care services in the community.  [facility] will continue to follow for any discharge planning needs and to coordinate continuation of palliative care in the outpatient setting.    If you have questions or need assistance, please call [phone] or contact the hospital Liaison listed on AMION.      Thank you for this referral.         Farrel Gordon, RN, [place] Hospital Liaison   [phone]

## 2021-04-13 NOTE — TOC Transition Note (Signed)
Transition of Care [facility]) - CM/SW Discharge Note   Patient Details  Name: Elijah White MRN: [id number] Date of Birth: 1940-04-16  Transition of Care [facility]) CM/SW Contact:  Benard Halsted, LCSW Phone Number: 04/13/2021, 1:34 PM   Clinical Narrative:    Patient will DC to: [facility]  Anticipated DC date: 04/13/21 Family notified: Daughter, Engineer, manufacturing systems by: Corey Harold   Per MD patient ready for DC to [facility]. RN to call report prior to discharge [phone]). RN, patient, patient's family, and facility notified of DC. Discharge Summary and FL2 sent to facility. DC packet on chart. Ambulance transport requested for patient.   CSW will sign off for now as social work intervention is no longer needed. Please consult [place] again if new needs arise.     Final next level of care: Skilled Nursing Facility Barriers to Discharge: Barriers Resolved   Patient Goals and CMS Choice Patient states their goals for this hospitalization and ongoing recovery are:: Feel better CMS Medicare.gov Compare Post Acute Care list provided to:: Patient Represenative (must comment) Choice offered to / list presented to : Adult Children  Discharge Placement   Existing PASRR number confirmed : 04/13/21          Patient [facility] bed at: [facility] Patient to be transferred to facility by: [facility] Name of family member notified: [name] Patient and family notified of of transfer: 04/13/21  Discharge Plan and Services In-house Referral: Clinical Social Work   Post Acute Care Choice: [place]                               Social Determinants of Health (SDOH) Interventions     Readmission Risk Interventions Readmission Risk Prevention Plan 04/10/2021 03/17/2021  Transportation Screening Complete Complete  Medication Review Press photographer) Complete -  PCP or Specialist appointment within 3-5 days of discharge Complete -  [place] or Home Care Consult Complete Complete   SW Recovery Care/Counseling Consult Complete Complete  Palliative Care Screening Not Applicable -  [place] Complete Complete  Some recent data might be hidden

## 2021-04-13 NOTE — Progress Notes (Signed)
D/C order noted. Contacted [facility] to make clinic aware pt will d/c today and resume care tomorrow.   Melven Sartorius Renal Navigator [phone]

## 2021-04-13 NOTE — Discharge Summary (Addendum)
PATIENT DETAILS Name: Elijah White Age: 81 y.o. Sex: male Date of Birth: 1941-02-03 MRN: [id number]. Admitting Physician: Orland Mustard, MD RHY:[id number], Para March, MD  Admit Date: 04/07/2021 Discharge date: 04/13/2021  Recommendations for Outpatient Follow-up:  Follow up with PCP in 1-2 weeks Needs hospice follow-up at SNF-if patient deteriorates severely/acutely-May benefit from transitioning to comfort measures. Ensure follow-up with IR, general surgery and dialysis clinic. Incidental finding-left adrenal nodule-needs dedicated CT/MRI-if felt necessary by attending MD at SNF/PCP.  Admitted From:  Home  Disposition: Skilled nursing facility   Discharge Condition: stable  CODE STATUS:   Code Status: DNR   Diet recommendation:  Diet Order             Diet - low sodium heart healthy           Diet general           Diet renal with fluid restriction Fluid restriction: 1200 mL Fluid; Room service appropriate? Yes; Fluid consistency: Thin  Diet effective now                    Brief Summary: Patient is a 81 y.o.  male with history of ESRD on HD MWF, PAD-s/p left BKA, HTN, DM-2-with 2 recent hospitalizations for acute cholecystitis requiring cholecystostomy tube placement-referred to the ED from HD center for hypotension.  See below for further details  Significant Hospital events: 1/11-1/22>> hospitalized for sepsis due to acute cholecystitis-underwent cholecystostomy drain placement on 1/12-drain pulled out on 1/17-monitored-stabilized on abx and discharged to SNF without cholecystostomy drain 1/23-2/1>> readmitted for severe abdominal pain-cholecystostomy tube placed on 1/24-discharge back to SNF 2/4>> readmitted for hypotension/RUQ pain  Significant imaging studies: 2/3>> CT abdomen/pelvis: Per cholecystostomy in place-but persistent distention of the gallbladder, progressive anasarca, 2.5 cm left adrenal nodule  Significant microbiology data: 2/3>> COVID/flu  PCR: Negative 2/3>> blood cultures: Negative  Procedures: None  Consults:  PCCM, Renal, IR  Brief Hospital Course: * Hypotension- (present on admission) Suspect multifactorial etiology-hypovolemia-smoldering sepsis from ongoing cholecystitis, fluid shift with hemodialysis and autonomic dysfunction associated with Parkinson's disease.  BP stable over the past several days on midodrine.  Tolerated HD without any major issues.  Continue midodrine on discharge  Acute cholecystitis- (present on admission) Appreciate general surgical input-cholecystostomy drain in place-completed antimicrobial therapy on 2/7.  Abdominal exam remains benign.  Please follow-up with general surgery in interventional radiology.  Delirium Has had some waxing and waning confusion over the past few days-this morning he appears appropriate-and is requesting that he be discharged back to skilled nursing facility.  Continue to maintain delirium precautions-suspect he will probably have better mental status once he is back to his family surroundings.  Pneumonia during recent hospitalization - (present on admission) Suspect sympathetic/PNA from neighboring cholecystitis-already treated during prior hospitalization-see above regarding antibiotics.    Anemia in chronic kidney disease- (present on admission) No evidence of blood loss-defer Aranesp/iron to nephrology  ESRD (end stage renal disease) (HCC)- (present on admission) Nephrology followed closely-please ensure resumption of outpatient HD.  Acute urinary retention with foley - (present on admission) At hospitalization on 1/11 he had complicated UTI, urinary retention and hydronephrosis requiring foley placement.  Urology follow-up in the outpatient setting was recommended-he has not yet seen yet.  At family's request-Foley catheter was discontinued on 2/5-Per nursing staff-he is oliguric at baseline and is voiding very minimally.  Continue to watch closely-bladder  scan on 2/7 did not show any acute retention   Paroxysmal atrial fibrillation (HCC)- (present on  admission) Maintaining sinus rhythm-not on anticoagulation due to severity of anemia/concern for GI bleeding and advanced age.  EGD last month was stable.  Resume metoprolol when BP permits  Severe aortic stenosis s/p TAVR  Stable. 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  Encourage oral intake-allow permissive hyperglycemia-not a candidate for tight glycemic control.    Recent Labs    04/12/21 1702 04/12/21 2050 04/13/21 0748  GLUCAP 127* 95 76    GERD (gastroesophageal reflux disease)/esophageal candidiasis - (present on admission) Continue protonix.  S/p EGD 03/2021 during hospitalization showed chronic gastritis-suspicion for Candida-completed therapy with Diflucan  Blindness/glaucoma  Continue with eye drops.  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  Parkinson's disease ([place])- (present on admission) Followed by neurology in the outpatient setting-continue Sinemet  Palliative care encounter DNR in place.  At baseline-elderly frail patient on HD with ESRD-is s/p left BKA and resident of SNF-he has had numerous hospitalizations recently due to acute cholecystitis and hypotension.  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  Family agreeable to have hospice follow him at [facility].  2.5 cm left adrenal nodule- (present on  admission) Radiology recommending dedicated CT/MRI examination once acute issues have resolved-we will defer to patient's primary care practitioner.  Pressure injury of skin- (present on admission) Pressure Injury 03/28/21 Coccyx Medial Stage 2 -  Partial thickness loss of dermis presenting as a shallow open injury with a red, pink wound bed without slough. wound bed is pink, no drainage noted (Active)  03/28/21 1028  Location: Coccyx  Location Orientation: Medial  Staging: Stage 2 -  Partial thickness loss of dermis presenting as a shallow open injury with a red, pink wound bed without slough.  Wound Description (Comments): wound bed is pink, no drainage noted  Present on Admission: Yes       Sepsis (HCC)-resolved as of 04/07/2021, (present on admission) 81 year old presenting from dialysis with hypotension and right sided chest pain found to meet sepsis criteria with elevated WBC and hypotension    Obesity: Estimated body mass index is 24.87 kg/m as calculated from the following:   Height as of 03/28/21: $RemoveBef'5\' 5"'SdoOrdNujT$  (1.651 m).   Weight as of this encounter: 67.8 kg.   RN pressure injury documentation: Pressure Injury 03/28/21 Coccyx Medial Stage 2 -  Partial thickness loss of dermis presenting as a shallow open injury with a red, pink wound bed without slough. wound bed is pink, no drainage noted (Active)  03/28/21 1028  Location: Coccyx  Location Orientation: Medial  Staging: Stage 2 -  Partial thickness loss of dermis presenting as a shallow open injury with a red, pink wound bed without slough.  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)   GERD (gastroesophageal reflux disease)/esophageal candidiasis    Blindness/glaucoma    Parkinson's disease (HCC)   2.5 cm left adrenal nodule   Palliative care encounter   Pressure injury of skin   Pruritus, unspecified   Discharge Instructions:  Activity:  As tolerated with Full fall precautions use walker/cane & assistance as needed   Discharge Instructions     Diet - low sodium heart healthy   Complete by: As directed    Diet general   Complete by: As directed    Discharge instructions   Complete by: As directed    Follow with Primary MD  Maryella Shivers, MD in 1-2 weeks  Please get a complete blood count and chemistry panel checked by your Primary MD at your next visit, and again as instructed by your Primary MD.  Get Medicines reviewed and adjusted: Please take all your medications with you for your next visit with your Primary MD  Laboratory/radiological data: Please request your Primary MD to go over all hospital tests and procedure/radiological results at the follow up, please ask your Primary MD to get all Hospital records sent to his/her office.  In some cases, they will be blood work, cultures and biopsy results pending at the time of your discharge. Please request that your primary care M.D. follows up on these results.  Also Note the following: If you experience worsening of your admission symptoms, develop shortness of breath, life threatening emergency, suicidal or homicidal thoughts you must seek medical attention immediately by calling 911 or calling your MD immediately  if symptoms less severe.  You must read complete instructions/literature along with all the possible adverse reactions/side effects for all the Medicines you take and that have been prescribed to you. Take any new  Medicines after you have completely understood and accpet all the possible adverse reactions/side effects.   Do not drive when taking Pain medications or sleeping medications (Benzodaizepines)  Do not take more than prescribed Pain, Sleep and Anxiety Medications. It is not advisable to combine anxiety,sleep and pain medications without talking with your primary care practitioner  Special Instructions: If you have smoked or chewed Tobacco  in the last 2 yrs please stop smoking, stop any regular Alcohol  and or any Recreational drug use.  Wear Seat belts while driving.  Please note: You were cared for by a hospitalist during your hospital stay. Once you are discharged, your primary care physician will handle any further medical issues. Please note that NO REFILLS for any discharge medications will be authorized once you are discharged, as it is imperative that you return to your primary care physician (or establish a relationship with a primary care physician if you do not have one) for your post hospital discharge needs so that they can reassess your need for medications and monitor your lab values.   CBGs before meals and at bedtime   Increase activity slowly   Complete by: As directed    No dressing needed   Complete by: As directed       Allergies as of 04/13/2021   No Known Allergies      Medication List     STOP taking these medications    amoxicillin-clavulanate 500-125 MG tablet Commonly known as:  AUGMENTIN   metoprolol tartrate 25 MG tablet Commonly known as: LOPRESSOR   oxyCODONE 5 MG immediate release tablet Commonly known as: Oxy IR/ROXICODONE       TAKE these medications    (feeding supplement) PROSource Plus liquid Take 30 mLs by mouth 2 (two) times daily between meals.   acetaminophen 500 MG tablet Commonly known as: TYLENOL Take 2 tablets (1,000 mg total) by mouth every 8 (eight) hours as needed for moderate pain or headache. What changed: when to take  this   atorvastatin 10 MG tablet Commonly known as: LIPITOR Take 1 tablet (10 mg total) by mouth in the morning.   baclofen 10 MG tablet Commonly known as: LIORESAL Take 0.5 tablets (5 mg total) by mouth 3 (three) times daily as needed (hiccups).   blood glucose meter kit and supplies Kit Dispense based on patient and insurance preference. Use up to four times daily as directed. (FOR ICD-9 250.00, 250.01). For QAC - HS accuchecks.   brimonidine 0.15 % ophthalmic solution Commonly known as: ALPHAGAN Place 1 drop into the right eye 3 (three) times daily.   carbidopa-levodopa 25-100 MG tablet Commonly known as: Sinemet Take 1 tablet by mouth 3 (three) times daily. What changed: additional instructions   cinacalcet 30 MG tablet Commonly known as: SENSIPAR Take 2 tablets (60 mg total) by mouth every Monday, Wednesday, and Friday with hemodialysis.   clopidogrel 75 MG tablet Commonly known as: PLAVIX Take 1 tablet (75 mg total) by mouth daily with breakfast.   Dialyvite 800 0.8 MG Tabs Take 1 tablet by mouth in the morning.   docusate sodium 100 MG capsule Commonly known as: COLACE Take 100 mg by mouth 2 (two) times daily.   dorzolamide-timolol 22.3-6.8 MG/ML ophthalmic solution Commonly known as: COSOPT Place 1 drop into the right eye 2 (two) times daily.   ferric citrate 1 GM 210 MG(Fe) tablet Commonly known as: AURYXIA Take 420 mg by mouth 3 (three) times daily with meals.   gabapentin 100 MG capsule Commonly known as: Neurontin Take 1 capsule (100 mg total) by mouth daily.   GenTeal Severe 0.3 % Gel ophthalmic ointment Generic drug: hypromellose Place 1 application into the left eye every 12 (twelve) hours as needed for dry eyes.   hydrOXYzine 25 MG tablet Commonly known as: ATARAX Take 25 mg by mouth every 8 (eight) hours as needed for itching.   melatonin 5 MG Tabs Take 5 mg by mouth at bedtime.   midodrine 10 MG tablet Commonly known as: PROAMATINE Take  1 tablet (10 mg total) by mouth 3 (three) times daily with meals.   nystatin 100000 UNIT/ML suspension Commonly known as: MYCOSTATIN Take 5 mLs (500,000 Units total) by mouth 4 (four) times daily.   OneTouch Verio test strip Generic drug: glucose blood USE AS DIRECTED TO TEST FOUR TIMES A DAY   pantoprazole 40 MG tablet Commonly known as: PROTONIX Take 1 tablet (40 mg total) by mouth 2 (two) times daily. What changed: when to take this   pilocarpine 4 % ophthalmic solution Commonly known as: PILOCAR Place 1 drop into the right eye 4 (four) times daily.   polyethylene glycol 17 g packet Commonly known as: MIRALAX / GLYCOLAX Take 17 g by mouth daily. What changed:  when to take this additional instructions   Rocklatan 0.02-0.005 % Soln Generic drug: Netarsudil-Latanoprost Place 1 drop into the right eye at bedtime.   senna-docusate 8.6-50 MG tablet Commonly known as: Senokot-S Take 1 tablet by mouth  at bedtime.   sevelamer carbonate 800 MG tablet Commonly known as: RENVELA Take 2 tablets (1,600 mg total) by mouth 3 (three) times daily with meals.               Discharge Care Instructions  (From admission, onward)           Start     Ordered   04/13/21 0000  No dressing needed        04/13/21 0945            No Known Allergies   Other Procedures/Studies: CT ABDOMEN PELVIS WO CONTRAST  Result Date: 03/31/2021 CLINICAL DATA:  Nausea and vomiting.  On dialysis. EXAM: CT ABDOMEN AND PELVIS WITHOUT CONTRAST TECHNIQUE: Multidetector CT imaging of the abdomen and pelvis was performed following the standard protocol without IV contrast. RADIATION DOSE REDUCTION: This exam was performed according to the departmental dose-optimization program which includes automated exposure control, adjustment of the mA and/or kV according to patient size and/or use of iterative reconstruction technique. COMPARISON:  Abdominal x-ray 03/31/2021. CT abdomen and pelvis  03/23/2021. FINDINGS: Lower chest: There is a moderate-sized right pleural effusion which has mildly increased. There is compressive atelectasis of the right lower lobe. There is a small left pleural effusion which appears stable. TAVR present. Hepatobiliary: The gallbladder is dilated and thick-walled. Heterogeneous hyperdensity is again seen within the gallbladder likely related to hemorrhage. Small layering gallstones are again noted. There is a new percutaneous cholecystostomy tube in place. There is a small amount of air in the gallbladder likely related to tube placement. There is no definite biliary ductal dilatation. No focal liver lesions are seen. Pancreas: Unremarkable. No pancreatic ductal dilatation or surrounding inflammatory changes. Spleen: Normal in size without focal abnormality. Adrenals/Urinary Tract: 2.5 x 2.1 cm left adrenal nodule is indeterminate and unchanged. Right adrenal gland within normal limits. There is bilateral renal atrophy. No urinary tract calculi or hydronephrosis. Bladder is decompressed by Foley catheter. Stomach/Bowel: Stomach is within normal limits. Appendix appears normal. No evidence of bowel wall thickening, distention, or inflammatory changes. Vascular/Lymphatic: Aortic atherosclerosis. No enlarged abdominal or pelvic lymph nodes. Reproductive: Prostate radiotherapy seeds are again noted. Other: There is a small amount of free fluid in the pelvis similar to prior study. There is a small fat containing left inguinal hernia. Mild body wall edema persists. There is also a fat containing umbilical hernia. Musculoskeletal: No acute or significant osseous findings. IMPRESSION: 1. New cholecystostomy tube in place. 2. The gallbladder is dilated. Cholelithiasis again seen. Heterogeneous hyperdensity in the gallbladder likely represents hemorrhage, unchanged. 3. Moderate right pleural effusion has increased. Small left pleural effusion is stable. 4. Small volume ascites and body  wall edema are stable. 5.  Aortic Atherosclerosis (ICD10-I70.0). Electronically Signed   By: Ronney Asters M.D.   On: 03/31/2021 18:22   CT ABDOMEN PELVIS WO CONTRAST  Result Date: 03/23/2021 CLINICAL DATA:  Postoperative RIGHT upper quadrant pain, headache ups, fall in hemoglobin, post percutaneous cholecystostomy on 03/16/2021 with removal of biliary drain on 03/21/2021 EXAM: CT ABDOMEN AND PELVIS WITHOUT CONTRAST TECHNIQUE: Multidetector CT imaging of the abdomen and pelvis was performed following the standard protocol without IV contrast. RADIATION DOSE REDUCTION: This exam was performed according to the departmental dose-optimization program which includes automated exposure control, adjustment of the mA and/or kV according to patient size and/or use of iterative reconstruction technique. COMPARISON:  03/17/2021 FINDINGS: Lower chest: Small BILATERAL pleural effusions, RIGHT greater than LEFT. Significant atelectasis of RIGHT lower lobe.  Minimal compressive atelectasis posterior LEFT lower lobe. Coronary arterial calcifications noted with evidence of TAVR. Hepatobiliary: Interval removal of cholecystostomy tube. Dependent calculi within gallbladder with additional new intermediate attenuation material within gallbladder lumen likely representing blood. Gallbladder remains distended. Liver unremarkable. Pancreas: Normal appearance Spleen: Normal appearance.  Small splenule adjacent to spleen. Adrenals/Urinary Tract: 2.5 x 2.1 cm diameter homogeneous LEFT adrenal mass, 32 HU attenuation on noncontrast imaging, probable adrenal adenoma. RIGHT adrenal gland unremarkable. No renal mass or hydronephrosis. Foley catheter decompresses urinary bladder. No ureteral dilatation. Stomach/Bowel: Normal appendix. Stomach and bowel loops normal appearance. Vascular/Lymphatic: Atherosclerotic calcifications aorta and iliac arteries. No adenopathy. Reproductive: Seed implants at prostate gland. Other: Small amount of  low-attenuation free fluid in pelvis and perihepatic. No free air. Small umbilical hernia containing fat. Musculoskeletal: Bones demineralized. IMPRESSION: Interval removal of cholecystostomy tube. Persistent gallbladder distention with dependent calculi and additional intermediate attenuation material within gallbladder lumen likely representing blood/hemorrhage. Small amount of nonspecific low-attenuation free intraperitoneal fluid perihepatic and in pelvis. 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 Small BILATERAL pleural effusions and atelectasis greater on RIGHT. Small umbilical hernia containing fat. Aortic Atherosclerosis (ICD10-I70.0). Electronically Signed   By: Lavonia Dana M.D.   On: 03/23/2021 13:34   DG Chest 1 View  Result Date: 03/15/2021 CLINICAL DATA:  Abdominal pain and vomiting. Clinical concern for aspiration. EXAM: CHEST  1 VIEW COMPARISON:  06/24/2020 FINDINGS: Normal sized heart. Interval minimal patchy density at the right lung base. The left lung remains clear. Aortic valve stent. Thoracic spine degenerative changes. Left axillary surgical clip. IMPRESSION: Minimal right basilar atelectasis, pneumonia or aspiration pneumonitis. Electronically Signed   By: Claudie Revering M.D.   On: 03/15/2021 14:07   DG Chest 2 View  Result Date: 03/27/2021 CLINICAL DATA:  Assess, abdominal pain, nausea and vomiting since yesterday, recent hospitalization for cholecystitis and complicated UTI EXAM: CHEST - 2 VIEW COMPARISON:  03/17/2021 FINDINGS: Normal heart size post TAVR. Mediastinal contours and pulmonary vascularity normal. Moderate to large RIGHT pleural effusion with small LEFT pleural effusion. Mild RIGHT basilar atelectasis and central peribronchial thickening. Upper lungs clear. No pneumothorax or acute osseous findings. IMPRESSION: Moderate to large RIGHT and small LEFT pleural effusions with associated RIGHT basilar atelectasis. Electronically Signed   By: Lavonia Dana M.D.   On: 03/27/2021  15:20   CT CHEST W CONTRAST  Result Date: 04/02/2021 CLINICAL DATA:  Acute abdominal pain.  Pneumonia. EXAM: CT CHEST, ABDOMEN, AND PELVIS WITH CONTRAST TECHNIQUE: Multidetector CT imaging of the chest, abdomen and pelvis was performed following the standard protocol during bolus administration of intravenous contrast. RADIATION DOSE REDUCTION: This exam was performed according to the departmental dose-optimization program which includes automated exposure control, adjustment of the mA and/or kV according to patient size and/or use of iterative reconstruction technique. CONTRAST:  123mL OMNIPAQUE IOHEXOL 350 MG/ML SOLN COMPARISON:  CT abdomen and pelvis 03/31/2021. CT angiogram chest abdomen and pelvis 12/01/2019. FINDINGS: CT CHEST FINDINGS Cardiovascular: The heart is mildly enlarged. There is no pericardial effusion. Aorta is normal in size. Patient is status post TAVR. There are atherosclerotic calcifications of the aorta and coronary arteries. Mediastinum/Nodes: No enlarged mediastinal, hilar, or axillary lymph nodes. Thyroid gland, trachea, and esophagus demonstrate no significant findings. Lungs/Pleura: There are small bilateral pleural effusions, right greater than left. Right pleural effusion has decreased from the prior examination. Right pleural effusion is likely partially loculated posteriorly. There is also small amount of loculated fluid in the right major fissure. There are patchy  ground-glass and airspace opacities in the right upper lobe and right lower lobe. There is atelectasis in the left lower lobe. There is no evidence for pneumothorax. There is some secretions in the trachea. Trachea and central airways appear patent. There is a stable 3 mm nodule in the left upper lobe image 4/52. No new pulmonary nodules are identified. Musculoskeletal: No acute fractures. Degenerative changes affect the spine. CT ABDOMEN PELVIS FINDINGS Hepatobiliary: Percutaneous cholecystostomy tube is again seen.  The gallbladder is dilated similar to the prior study. There is a small amount of air in the gallbladder. Layering small stones are unchanged. Heterogeneous hyperdensity throughout the gallbladder likely represents hemorrhagic products, also unchanged. There is no biliary ductal dilatation. No focal liver lesions are seen. Pancreas: Unremarkable. No pancreatic ductal dilatation or surrounding inflammatory changes. Spleen: Normal in size without focal abnormality. Adrenals/Urinary Tract: 2.5 cm left adrenal nodule is indeterminate and unchanged. Right adrenal gland is within normal limits. The bladder is decompressed by Foley catheter. There is no hydronephrosis. There is a cortical hypodensity in the superior pole the left kidney which is too small to characterize, likely cysts. Stomach/Bowel: Appendix appears normal. No evidence of bowel wall thickening, distention, or inflammatory changes. There is scattered air-fluid levels throughout the colon. The stomach is completely decompressed and gastric wall thickening can not be excluded. Vascular/Lymphatic: Aortic atherosclerosis. No enlarged abdominal or pelvic lymph nodes. Reproductive: Prostate radiotherapy seeds are present. Other: There is a small fat containing umbilical hernia. There is trace ascites, decreased from prior. There is mild body wall edema which is similar to the prior study. Musculoskeletal: No acute or significant osseous findings. IMPRESSION: 1. Small right pleural effusion has decreased in size. This effusion is partially loculated posteriorly and within the major fissure. Loculation may be secondary to chronicity, hemorrhage or malignancy. 2. Patchy ground-glass and airspace opacities in the right upper lobe and right lower lobe most consistent with pneumonia. 3. Stable small left pleural effusion. 4. The gallbladder is dilated. Cholecystostomy tube is in place. Gallbladder air, hemorrhagic products and gallstones are present. Appearance is  unchanged from the prior study. 5. Stable body wall edema and trace ascites. 6. Stable indeterminate left adrenal nodule. 7. Stable 3 mm left upper lobe pulmonary nodule. No follow-up needed if patient is low-risk. Non-contrast chest CT can be considered in 12 months if patient is high-risk. This recommendation follows the consensus statement: Guidelines for Management of Incidental Pulmonary Nodules Detected on CT Images: From the Fleischner Society 2017; Radiology 2017; 284:228-243. Electronically Signed   By: Ronney Asters M.D.   On: 04/02/2021 21:11   NM Hepatobiliary Liver Func  Result Date: 03/28/2021 CLINICAL DATA:  Ultrasound CT findings concern for acute cholecystitis. EXAM: NUCLEAR MEDICINE HEPATOBILIARY IMAGING TECHNIQUE: Sequential images of the abdomen were obtained out to 60 minutes following intravenous administration of radiopharmaceutical. RADIOPHARMACEUTICALS:  5.3 mCi Tc-76m  Choletec IV COMPARISON:  Ultrasound 03/27/2021, CT 03/23/2021 FINDINGS: Prompt clearance radiotracer from blood pool and homogeneous uptake within liver. Counts are present within the common bile duct by 15 minutes. Counts reached the proximal small bowel by 20 minutes. No filling of the gallbladder at 60 minutes. IV morphine was administered to augment filling of the gallbladder. No filling of the gallbladder present on 30 minutes of post morphine imaging. IMPRESSION: Non filling the gallbladder is consistent with ACUTE CHOLECYSTITIS. Patent common bile duct. These results will be called to the ordering clinician or representative by the Radiologist Assistant, and communication documented in the PACS or [place]  [facility]. Electronically Signed   By: Suzy Bouchard M.D.   On: 03/28/2021 16:02   CT ABDOMEN PELVIS W CONTRAST  Result Date: 04/07/2021 CLINICAL DATA:  Abdominal pain, acute, nonlocalized. EXAM: CT ABDOMEN AND PELVIS WITH CONTRAST TECHNIQUE: Multidetector CT imaging of the abdomen and pelvis was performed  using the standard protocol following bolus administration of intravenous contrast. RADIATION DOSE REDUCTION: This exam was performed according to the departmental dose-optimization program which includes automated exposure control, adjustment of the mA and/or kV according to patient size and/or use of iterative reconstruction technique. CONTRAST:  156mL OMNIPAQUE IOHEXOL 350 MG/ML SOLN COMPARISON:  04/02/2021 FINDINGS: Lower chest: Moderate right pleural effusion has slightly increased in size with progressive, compressive atelectasis of the right lower lobe. Tiny left pleural effusion with minimal left basilar compressive atelectasis. Extensive multi-vessel coronary artery calcification again noted. 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 Cardiac size within normal limits. Hepatobiliary: Percutaneous cholecystostomy is unchanged with its retaining loop seen within the gallbladder lumen. The gallbladder, however, remains distended suggesting inadequate drainage of gallbladder contents and/or occlusion of the catheter. Previously noted hyperdensity related to hemorrhagic intraluminal contents is not as well appreciated on this examination possibly related to partial lysis of a blood product. Layering gallstones are again identified within the gallbladder lumen. Liver otherwise unremarkable. No intra or extrahepatic biliary ductal dilation. Pancreas: Unremarkable Spleen: Unremarkable Adrenals/Urinary Tract: 2.1 x 2.5 cm heterogeneously enhancing left adrenal nodule is again identified and appears stable since remote prior examination of 11/26/2019, though is indeterminate. The right adrenal gland is unremarkable. Moderate bilateral renal cortical atrophy again noted. No enhancing intrarenal masses are identified. No hydronephrosis. No intrarenal or ureteral calculi. Vascular calcifications are noted within the renal hila bilaterally. The bladder is decompressed with a Foley catheter balloon seen  within its lumen. Stomach/Bowel: Stomach is within normal limits. Appendix appears normal. No evidence of bowel wall thickening, distention, or inflammatory changes. Vascular/Lymphatic: Extensive aortoiliac atherosclerotic calcification with probable hemodynamically significant stenoses involving the lower extremity arterial inflow bilaterally. This is unchanged and not optimally assessed on this non arteriographic examination. No aortic aneurysm. No pathologic adenopathy within the abdomen and pelvis. Reproductive: Brachytherapy seeds are seen within the prostate gland Other: There is progressive moderate diffuse subcutaneous body wall edema, particularly within the right flank which may relate to dependent positioning or superimposed local trauma or inflammation. Small fat containing left inguinal hernia. Rectum unremarkable. Musculoskeletal: No acute bone abnormality. No lytic or blastic bone lesion. IMPRESSION: Stable examination with percutaneous cholecystostomy in place but persistent distension of the gallbladder suggesting inadequate drainage of complex intraluminal contents and/or catheter occlusion. Evolution of intraluminal blood product within the gallbladder lumen noted. Layering gallstones again identified. Progressive anasarca with enlarging pleural effusions and progressive diffuse body wall subcutaneous edema. Asymmetric right flank subcutaneous body wall edema which may relate to dependent positioning or superimposed local trauma or inflammation. Correlation with clinical examination is recommended. 2.5 cm heterogeneously enhancing left adrenal nodule. While stable since remote prior examination of 11/26/2019, a malignant lesion such as a pheochromocytoma, is not excluded and dedicated adrenal mass protocol CT or MRI examination is recommended once the patient's acute issues have resolved. Peripheral vascular disease with suspected bilateral lower extremity arterial inflow stenoses. This would be  better assessed with CT arteriography if there is a clinical history of a lower extremity claudication or arterial vascular insufficiency. Aortic Atherosclerosis (ICD10-I70.0). Electronically Signed   By: Fidela Salisbury M.D.   On: 04/07/2021 19:22   CT ABDOMEN PELVIS  W CONTRAST  Result Date: 04/02/2021 CLINICAL DATA:  Acute abdominal pain.  Pneumonia. EXAM: CT CHEST, ABDOMEN, AND PELVIS WITH CONTRAST TECHNIQUE: Multidetector CT imaging of the chest, abdomen and pelvis was performed following the standard protocol during bolus administration of intravenous contrast. RADIATION DOSE REDUCTION: This exam was performed according to the departmental dose-optimization program which includes automated exposure control, adjustment of the mA and/or kV according to patient size and/or use of iterative reconstruction technique. CONTRAST:  135mL OMNIPAQUE IOHEXOL 350 MG/ML SOLN COMPARISON:  CT abdomen and pelvis 03/31/2021. CT angiogram chest abdomen and pelvis 12/01/2019. FINDINGS: CT CHEST FINDINGS Cardiovascular: The heart is mildly enlarged. There is no pericardial effusion. Aorta is normal in size. Patient is status post TAVR. There are atherosclerotic calcifications of the aorta and coronary arteries. Mediastinum/Nodes: No enlarged mediastinal, hilar, or axillary lymph nodes. Thyroid gland, trachea, and esophagus demonstrate no significant findings. Lungs/Pleura: There are small bilateral pleural effusions, right greater than left. Right pleural effusion has decreased from the prior examination. Right pleural effusion is likely partially loculated posteriorly. There is also small amount of loculated fluid in the right major fissure. There are patchy ground-glass and airspace opacities in the right upper lobe and right lower lobe. There is atelectasis in the left lower lobe. There is no evidence for pneumothorax. There is some secretions in the trachea. Trachea and central airways appear patent. There is a stable 3 mm  nodule in the left upper lobe image 4/52. No new pulmonary nodules are identified. Musculoskeletal: No acute fractures. Degenerative changes affect the spine. CT ABDOMEN PELVIS FINDINGS Hepatobiliary: Percutaneous cholecystostomy tube is again seen. The gallbladder is dilated similar to the prior study. There is a small amount of air in the gallbladder. Layering small stones are unchanged. Heterogeneous hyperdensity throughout the gallbladder likely represents hemorrhagic products, also unchanged. There is no biliary ductal dilatation. No focal liver lesions are seen. Pancreas: Unremarkable. No pancreatic ductal dilatation or surrounding inflammatory changes. Spleen: Normal in size without focal abnormality. Adrenals/Urinary Tract: 2.5 cm left adrenal nodule is indeterminate and unchanged. Right adrenal gland is within normal limits. The bladder is decompressed by Foley catheter. There is no hydronephrosis. There is a cortical hypodensity in the superior pole the left kidney which is too small to characterize, likely cysts. Stomach/Bowel: Appendix appears normal. No evidence of bowel wall thickening, distention, or inflammatory changes. There is scattered air-fluid levels throughout the colon. The stomach is completely decompressed and gastric wall thickening can not be excluded. Vascular/Lymphatic: Aortic atherosclerosis. No enlarged abdominal or pelvic lymph nodes. Reproductive: Prostate radiotherapy seeds are present. Other: There is a small fat containing umbilical hernia. There is trace ascites, decreased from prior. There is mild body wall edema which is similar to the prior study. Musculoskeletal: No acute or significant osseous findings. IMPRESSION: 1. Small right pleural effusion has decreased in size. This effusion is partially loculated posteriorly and within the major fissure. Loculation may be secondary to chronicity, hemorrhage or malignancy. 2. Patchy ground-glass and airspace opacities in the right  upper lobe and right lower lobe most consistent with pneumonia. 3. Stable small left pleural effusion. 4. The gallbladder is dilated. Cholecystostomy tube is in place. Gallbladder air, hemorrhagic products and gallstones are present. Appearance is unchanged from the prior study. 5. Stable body wall edema and trace ascites. 6. Stable indeterminate left adrenal nodule. 7. Stable 3 mm left upper lobe pulmonary nodule. No follow-up needed if patient is low-risk. Non-contrast chest CT can be considered in 12 months if patient  is high-risk. This recommendation follows the consensus statement: Guidelines for Management of Incidental Pulmonary Nodules Detected on CT Images: From the Fleischner Society 2017; Radiology 2017; 284:228-243. Electronically Signed   By: Ronney Asters M.D.   On: 04/02/2021 21:11   CT ABDOMEN PELVIS W CONTRAST  Result Date: 03/17/2021 CLINICAL DATA:  Acute generalized abdominal pain. EXAM: CT ABDOMEN AND PELVIS WITH CONTRAST TECHNIQUE: Multidetector CT imaging of the abdomen and pelvis was performed using the standard protocol following bolus administration of intravenous contrast. RADIATION DOSE REDUCTION: This exam was performed according to the departmental dose-optimization program which includes automated exposure control, adjustment of the mA and/or kV according to patient size and/or use of iterative reconstruction technique. CONTRAST:  33mL OMNIPAQUE IOHEXOL 300 MG/ML  SOLN COMPARISON:  March 15, 2021. FINDINGS: Lower chest: Right lower lobe pneumonia or atelectasis is noted. Hepatobiliary: Interval placement of percutaneous cholecystostomy tube is noted. Continued gallbladder distention and cholelithiasis is noted. No biliary dilatation is noted. The liver is unremarkable. Pancreas: Unremarkable. No pancreatic ductal dilatation or surrounding inflammatory changes. Spleen: Normal in size without focal abnormality. Adrenals/Urinary Tract: Stable 2.6 cm left adrenal nodule is noted.  Stable right adrenal gland enlargement is noted. Mild bilateral renal atrophy is noted. No nephrolithiasis is noted. Moderate right hydroureteronephrosis is noted without obstructing calculus. Stable dilatation of left renal pelvis is noted without calyceal dilatation. Mild left ureteral dilatation is noted. No obstructing calculus is noted. Mild urinary bladder distention is noted. Stomach/Bowel: Stomach is within normal limits. Appendix appears normal. No evidence of bowel wall thickening, distention, or inflammatory changes. Vascular/Lymphatic: Aortic atherosclerosis. No enlarged abdominal or pelvic lymph nodes. Reproductive: Status post prostatic brachytherapy seed placement. Other: No abdominal wall hernia or abnormality. No abdominopelvic ascites. Musculoskeletal: No acute or significant osseous findings. IMPRESSION: Interval development of right lower lobe opacity is noted concerning for pneumonia or atelectasis. Interval placement of percutaneous cholecystostomy tube in grossly good position. Stable gallbladder distension and cholelithiasis is noted. Grossly stable moderate right hydroureteronephrosis is noted as well as prominent left renal pelvis and mild distal left ureteral dilatation, without evidence of obstructing calculus. Mild urinary bladder distention is noted. 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 Aortic Atherosclerosis (ICD10-I70.0). Electronically Signed   By: Marijo Conception M.D.   On: 03/17/2021 09:41   US Abdomen Limited  Result Date: 03/15/2021 CLINICAL DATA:  Right upper quadrant pain EXAM: ULTRASOUND ABDOMEN LIMITED RIGHT UPPER QUADRANT COMPARISON:  None. FINDINGS: Gallbladder: Distended with sludge and calculi measuring up to 4 mm. No wall thickening visualized. No pericholecystic fluid. No sonographic Murphy sign noted by sonographer. Common bile duct: Diameter: 3 mm, normal Liver: Left lobe of liver is poorly visualized due  to bowel gas. No focal lesion identified. Within normal limits in parenchymal echogenicity. Portal vein is patent on color Doppler imaging with normal direction of blood flow towards the liver. Other: None. IMPRESSION: Distended gallbladder with sludge and calculi but no additional sonographic evidence of acute cholecystitis. Electronically Signed   By: Macy Mis M.D.   On: 03/15/2021 11:40   IR Perc Cholecystostomy  Result Date: 03/28/2021 INDICATION: 81 year old male referred for percutaneous cholecystostomy EXAM: CHOLECYSTOSTOMY MEDICATIONS: 2 g Mefoxin; The antibiotic was administered within an appropriate time frame prior to the initiation of the procedure. ANESTHESIA/SEDATION: Moderate (conscious) sedation was employed during this procedure. A total of Versed 1.0 mg and Fentanyl 25 mcg was administered intravenously. Moderate Sedation Time: 13 minutes. The patient's level of consciousness and vital signs  were monitored continuously by radiology nursing throughout the procedure under my direct supervision. FLUOROSCOPY TIME:  Fluoroscopy Time: 0 minutes 42 seconds (3 mGy). COMPLICATIONS: None PROCEDURE: Informed written consent was obtained from the patient and the patient's family after a thorough discussion of the procedural risks, benefits and alternatives. All questions were addressed. Maximal Sterile Barrier Technique was utilized including caps, mask, sterile gowns, sterile gloves, sterile drape, hand hygiene and skin antiseptic. A timeout was performed prior to the initiation of the procedure. Ultrasound survey of the right upper quadrant was performed for planning purposes. Once the patient is prepped and draped in the usual sterile fashion, the skin and subcutaneous tissues overlying the gallbladder were generously infiltrated 1% lidocaine for local anesthesia. A coaxial needle was advanced under ultrasound guidance through the skin subcutaneous tissues and a small segment of liver into the  gallbladder lumen. With removal of the stylet, spontaneous dark bile drainage occurred. Using modified Seldinger technique, a 10 French drain was placed into the gallbladder fossa, with aspiration of the sample for the lab. Contrast injection confirmed position of the tube within the gallbladder lumen. Drainage catheter was attached to gravity drain with a suture retention placed. Patient tolerated the procedure well and remained hemodynamically stable throughout. No complications were encountered and no significant blood loss encountered. IMPRESSION: Status post image guided percutaneous cholecystostomy. Signed, Dulcy Fanny. Dellia Nims, RPVI [facility] Radiology Electronically Signed   By: Corrie Mckusick D.O.   On: 03/28/2021 17:26   IR Perc Cholecystostomy  Result Date: 03/16/2021 INDICATION: 81 year old male with right upper quadrant pain and imaging findings concerning for acute calculus cholecystitis. A nuclear medicine HIDA scan was ordered to confirm, however patient's clinical status has deteriorated overnight with significant increase in leukocytosis, new oxygen requirement and increasing pain. Therefore, we are proceeding directly with percutaneous transhepatic cholecystostomy tube placement. EXAM: CHOLECYSTOSTOMY MEDICATIONS: In patient currently on intravenous Zosyn. No additional antibiotic prophylaxis administered. ANESTHESIA/SEDATION: Moderate (conscious) sedation was employed during this procedure. A total of Versed 0.5 mg and Fentanyl 25 mcg and 0.5 mg Dilaudid was administered intravenously. Moderate Sedation Time: 10 minutes. The patient's level of consciousness and vital signs were monitored continuously by radiology nursing throughout the procedure under my direct supervision. FLUOROSCOPY TIME:  Fluoroscopy Time: 0 minutes 36 seconds (2 mGy). COMPLICATIONS: None immediate. PROCEDURE: Informed written consent was obtained from the patient after a  thorough discussion of the procedural risks, benefits and alternatives. All questions were addressed. Maximal Sterile Barrier Technique was utilized including caps, mask, sterile gowns, sterile gloves, sterile drape, hand hygiene and skin antiseptic. A timeout was performed prior to the initiation of the procedure. The right upper quadrant was interrogated with ultrasound. The gallbladder is markedly distended. A suitable skin entry site was selected and marked. The overlying skin was sterilely prepped and draped in the standard fashion using chlorhexidine skin prep. Local anesthesia was attained by infiltration with 1% lidocaine. A small dermatotomy was made. Under real-time ultrasound guidance, a 21 gauge Accustick needle was advanced along a short transhepatic course and into the gallbladder lumen. Images were obtained and stored for the medical record. A 0.018 wire was advanced in the gallbladder lumen. The needle was exchanged for the Accustick transitional dilator which was advanced over the wire and into the gallbladder. Aspiration yields dark black bile. A sample was sent for Gram stain and culture. A 0.035 wire was coiled in the gallbladder lumen. The skin tract was dilated to 10 [place]. A Lacinda Axon  10.2 [place] all-purpose drainage catheter was advanced over the wire and formed in the gallbladder. The catheter was connected to gravity bag drainage and secured to the skin with 0 Prolene suture. Overall, the patient tolerated the procedure well. IMPRESSION: Successful placement of 10 French percutaneous transhepatic cholecystostomy tube for the indication of acute calculus cholecystitis. Electronically Signed   By: Malachy Moan M.D.   On: 03/16/2021 14:07   DG Chest Port 1 View  Result Date: 04/07/2021 CLINICAL DATA:  Chest pain, cholecystostomy EXAM: PORTABLE CHEST 1 VIEW COMPARISON:  04/02/2021 chest radiograph. 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 Surgical clips overlie the lower right heart and  left axilla. Partially visualized pigtail cholecystostomy tube in the right upper quadrant of the abdomen. Stable cardiomediastinal silhouette with borderline mild cardiomegaly. No pneumothorax. Slight blunting of the right costophrenic angle, unchanged, cannot exclude small right pleural effusion. No left pleural effusion. No overt pulmonary edema. Slightly low lung volumes. Patchy right lung base opacity, slightly improved. IMPRESSION: 1. Stable borderline mild cardiomegaly without overt pulmonary edema. 2. Possible small right pleural effusion, unchanged. 3. Slightly improved patchy right lung base opacity. Electronically Signed   By: Delbert Phenix M.D.   On: 04/07/2021 13:08   DG CHEST PORT 1 VIEW  Result Date: 04/02/2021 CLINICAL DATA:  Shortness of breath.  Follow-up exam. EXAM: PORTABLE CHEST 1 VIEW COMPARISON:  03/24/2021 and older studies. FINDINGS: Patchy airspace opacity has increased at the right lung base, extending to the right mid lung. Mild linear atelectasis or scarring is noted at the medial left lung base, stable. Remainder of the lungs is clear. Cardiac silhouette normal in size. Stable endovascular aortic valve replacement. No pneumothorax. Stable pigtail catheter in the right upper quadrant. IMPRESSION: 1. Interval increase in airspace opacity at the right lung base from the previous day's exam, which may reflect reaccumulation of fluid and associated atelectasis, with infection also in the differential diagnosis. 2. No other change.  No pneumothorax. Electronically Signed   By: Amie Portland M.D.   On: 04/02/2021 08:30   DG Chest Port 1 View  Result Date: 04/01/2021 CLINICAL DATA:  Status post thoracentesis.  Right pleural effusion. EXAM: PORTABLE CHEST 1 VIEW COMPARISON:  03/27/2021. FINDINGS: There is decreased opacity at the right lung base with most of the right hemidiaphragm now defined. Residual opacity at the right base is consistent with atelectasis, likely with a small residual  pleural effusion. Remainder of the lungs is clear. No pneumothorax. A pigtail catheter projects in the right upper quadrant. Cardiac silhouette is normal in size. IMPRESSION: 1. Decreased right lung base opacity following thoracentesis. Residual opacity consistent with atelectasis and a small effusion. 2. Remainder of the lungs is clear. 3. No pneumothorax. Electronically Signed   By: Amie Portland M.D.   On: 04/01/2021 13:37   DG CHEST PORT 1 VIEW  Result Date: 03/17/2021 CLINICAL DATA:  Chest abdominal pain.  Aspiration in airway. EXAM: PORTABLE CHEST 1 VIEW COMPARISON:  AP chest 03/16/2021 FINDINGS: The patient is mildly rightward rotated. Within this limitation, cardiac silhouette and mediastinal contours are grossly unchanged and within normal limits. Calcification is seen within the aortic arch. 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 The lungs appear clear. No pleural effusion or pneumothorax. No acute skeletal abnormality. IMPRESSION: No acute lung process. Electronically Signed   By: Neita Garnet M.D.   On: 03/17/2021 09:21   DG CHEST PORT 1 VIEW  Result Date: 03/16/2021 CLINICAL DATA:  Hypoxia EXAM: PORTABLE CHEST 1 VIEW COMPARISON:  Chest x-ray dated March 15, 2021 FINDINGS: Cardiac and mediastinal contours are unchanged within normal limits. Prior transcatheter aortic valve replacement. Interval resolution of right basilar lung opacity, favor resolved atelectasis. No focal consolidation. No large pleural effusion pneumothorax. IMPRESSION: Interval resolution of right basilar lung opacity, favor resolved atelectasis. Electronically Signed   By: Yetta Glassman M.D.   On: 03/16/2021 07:46   DG Abd Portable 1V  Result Date: 03/31/2021 CLINICAL DATA:  Emesis EXAM: PORTABLE ABDOMEN - 1 VIEW COMPARISON:  Abdominal radiograph dated March 29, 2021 FINDINGS: Nonobstructive bowel gas pattern. Surgical drain noted in the right upper quadrant. Partially visualized left lung bases clear.  IMPRESSION: Nonobstructive bowel gas pattern. Electronically Signed   By: Yetta Glassman M.D.   On: 03/31/2021 10:49   DG Abd Portable 1V  Result Date: 03/29/2021 CLINICAL DATA:  Emesis for 2-3 days, history of cancer and diabetes in an 81 year old male. EXAM: PORTABLE ABDOMEN - 1 VIEW COMPARISON:  CT of March 23, 2021. FINDINGS: A percutaneous cholecystostomy tube is partially visualized projecting over the RIGHT lower quadrant. RIGHT and LEFT hemidiaphragmatic contours are excluded from view. Imaging excludes the upper abdomen on today's study. There is no sign of bowel obstruction. Small amount of stool and gas present in the rectum. Brachytherapy seeds are present in the prostate. On limited assessment there is no acute regional skeletal process. IMPRESSION: 1. No sign of bowel obstruction. Imaging excludes the upper abdomen on today's study. 2. Cholecystostomy tube partially visualized projecting over the RIGHT lower quadrant. Electronically Signed   By: Zetta Bills M.D.   On: 03/29/2021 14:47   CT Renal Stone Study  Result Date: 03/15/2021 CLINICAL DATA:  Right abdominal pain. EXAM: CT ABDOMEN AND PELVIS WITHOUT CONTRAST TECHNIQUE: Multidetector CT imaging of the abdomen and pelvis was performed following the standard protocol without IV contrast. RADIATION DOSE REDUCTION: This exam was performed according to the departmental dose-optimization program which includes automated exposure control, adjustment of the mA and/or kV according to patient size and/or use of iterative reconstruction technique. COMPARISON:  CTA abdomen and pelvis 12/01/2019 FINDINGS: Lower chest: Trace right pleural fluid is new from prior. Unchanged mild bibasilar bronchiectasis. Mild dependent right lower lobe ground-glass likely subsegmental atelectasis. A partially visualized ascending aortic endograft. Dense coronary artery calcifications are seen. No pericardial effusion. Lack of intra-articular fluid limits evaluation  of the abdominal and pelvic organ parenchyma. The following findings are made within this limitation. Hepatobiliary: Smooth liver contours. No gross liver lesion is seen. The gallbladder is again moderately distended with numerous layering small gallstones, similar to prior. No definite gallbladder wall thickening. Pancreas: Unremarkable. No pancreatic ductal dilatation or surrounding inflammatory changes. Spleen: Normal in size without focal abnormality. Adrenals/Urinary Tract: Left adrenal 2.6 cm nodule is unchanged from 11/26/2019 with internal density of 15 Hounsfield units. Right adrenal 15 mm nodule is unchanged in size with internal density of 5 Hounsfield units suggesting a benign lipid rich adenoma. These both again are likely benign. Mild left-greater-than-right renal pelvis vascular calcifications. Mild bilateral renal cortical thinning is again seen. Bilateral moderate extrarenal pelves are again noted. There is again mild-to-moderate dilatation of the bilateral ureters diffusely down to the level of the ureteropelvic junction likely chronic. No obstructing renal stone is seen. No hydronephrosis. Within the limitations of lack of IV contrast, no contour deforming renal mass. The urinary bladder is grossly unremarkable. Stomach/Bowel: No bowel wall thickening. The terminal ileum is unremarkable. The appendix is within normal limits. No dilated loops of bowel to  indicate bowel obstruction. Vascular/Lymphatic: No abdominal aortic aneurysm. Dense vascular calcifications. No enlarged abdominal or pelvic lymph nodes. 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 Other: Small fat containing umbilical hernia, unchanged. Unchanged small fat containing left inguinal hernia. No free air or free fluid. Musculoskeletal: Mild multilevel degenerative disc changes. Old healed lateral right eleventh rib fracture unchanged. IMPRESSION: 1. No nephroureterolithiasis or  hydronephrosis. 2. Cholelithiasis. 3. 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 4. Small fat containing umbilical and left inguinal hernias. 5. Trace right pleural effusion with mild subsegmental atelectasis. 6.  Aortic Atherosclerosis (ICD10-I70.0). Electronically Signed   By: Yvonne Kendall   On: 03/15/2021 11:14   IR CHOLANGIOGRAM EXISTING TUBE  Result Date: 03/23/2021 INDICATION: Cholelithiasis, status post percutaneous cholecystostomy catheter placement 03/16/2021. Difficulty flushing , no output. EXAM: CHOLANGIOGRAM THROUGH EXISTING CATHETER MEDICATIONS: None indicated ANESTHESIA/SEDATION: None required FLUOROSCOPY TIME:  6 seconds; 1 mGy COMPLICATIONS: None immediate. PROCEDURE: Informed written consent was obtained from the patient after a thorough discussion of the procedural risks, benefits and alternatives. All questions were addressed. Inspection of the right upper quadrant shows significant retraction of the pigtail drain catheter since prior study. Injection shows the catheter is in the subcutaneous tissues. There is no filling of tract to the gallbladder, nor of the gallbladder itself. IMPRESSION: 1. The drain catheter had pulled out completely from the gallbladder with no opacification of the tract or lumen. The catheter was removed. PLAN: Observe clinically. If symptoms recur, patient may need new percutaneous cholecystostomy catheter placement. Electronically Signed Electronically Signed   By: Lucrezia Europe M.D.   On: 03/23/2021 15:59   ECHOCARDIOGRAM LIMITED  Result Date: 03/17/2021    ECHOCARDIOGRAM LIMITED REPORT   Patient Name:   Elijah White Date of Exam: 03/17/2021 Medical Rec #:  [id number]       Height:       65.0 in Accession #:    [id number]      Weight:       143.3 lb Date of Birth:  1940/10/18       BSA:          1.717 m Patient Age:    39 years        BP:           116/50 mmHg Patient Gender: M               HR:           114 bpm. Exam Location:   Inpatient Procedure: Cardiac Doppler, Color Doppler and Limited Echo                         STAT ECHO Reported to: Dr Gwyndolyn Kaufman on 03/17/2021 5:50:00 PM. Indications:    Hypotension  History:        Patient has no prior history of Echocardiogram examinations,                 most recent 01/05/2021.                 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  Sonographer:    Clayton Lefort RDCS (AE) Referring Phys: [id number] Darreld Mclean  Sonographer Comments: Image acquisition challenging due to respiratory motion. IMPRESSIONS  1. Left ventricular ejection fraction, by estimation, is 60 to 65%.  The left ventricle has normal function. There is moderate concentric left ventricular hypertrophy.  2. Right ventricular systolic function is normal. The right ventricular size is normal.  3. The mitral valve is degenerative. Severe mitral annular calcification.  4. 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 Echo findings are consistent with normal structure and function of the aortic valve prosthesis. Aortic valve mean gradient measures 5.0 mmHg. Aortic valve Vmax measures 1.40 m/s. DI 0.8. There is no paravalvular leak.  5. The inferior vena cava is normal in size with <50% respiratory variability, suggesting right atrial pressure of 8 mmHg. Comparison(s): Compared to prior TTE on 01/2021, there is no significant change. TAVR valve continues to be well seated with current gradient 13mmHg (previously 80mmHg). FINDINGS  Left Ventricle: Left ventricular ejection fraction, by estimation, is 60 to 65%. The left ventricle has normal function. The left ventricular internal cavity size was normal in size. There is moderate concentric left ventricular hypertrophy. Right Ventricle: The right ventricular size is normal. Right ventricular systolic function is normal. Pericardium: There  is no evidence of pericardial effusion. Mitral Valve: The mitral valve is degenerative in appearance. There is moderate thickening of the mitral valve leaflet(s). There is moderate calcification of the mitral valve leaflet(s). Severe mitral annular calcification. Tricuspid Valve: The tricuspid valve is normal in structure. Tricuspid valve regurgitation is trivial. Aortic Valve: DI 0.8. No PVL. The aortic valve has been repaired/replaced. Aortic valve mean gradient measures 5.0 mmHg. Aortic valve peak gradient measures 7.8 mmHg. Aortic valve area, by VTI measures 2.67 cm. 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 Echo findings are consistent with normal structure and function of the aortic valve prosthesis. Pulmonic Valve: The pulmonic valve was not well visualized. Pulmonic valve regurgitation is trivial. Aorta: The aortic root and ascending aorta are structurally normal, with no evidence of dilitation. Venous: The inferior vena cava is normal in size with less than 50% respiratory variability, suggesting right atrial pressure of 8 mmHg. LEFT VENTRICLE PLAX 2D LVIDd:         3.90 cm LVIDs:         3.00 cm LV PW:         1.30 cm LV IVS:        1.20 cm LVOT diam:     2.00 cm LV SV:         61 LV SV Index:   36 LVOT Area:     3.14 cm  LEFT ATRIUM           Index LA diam:      3.40 cm 1.98 cm/m LA Vol (A4C): 20.0 ml 11.65 ml/m  AORTIC VALVE AV Area (Vmax):    2.51 cm AV Area (Vmean):   2.53 cm AV Area (VTI):     2.67 cm AV Vmax:           140.00 cm/s AV Vmean:          104.000 cm/s AV VTI:            0.228 m AV Peak Grad:      7.8 mmHg AV Mean Grad:      5.0 mmHg LVOT Vmax:         112.00 cm/s LVOT Vmean:        83.900 cm/s LVOT VTI:          0.194 m LVOT/AV VTI  ratio: 0.85  AORTA Ao Root diam: 2.50 cm Ao Asc diam:  2.60 cm  SHUNTS Systemic VTI:  0.19 m Systemic Diam: 2.00 cm Gwyndolyn Kaufman MD Electronically signed by Gwyndolyn Kaufman MD Signature Date/Time:  03/17/2021/6:28:04 PM    Final    [place] EKG SITE RITE  Result Date: 03/19/2021 If Site Rite image not attached, placement could not be confirmed due to current cardiac rhythm.  US Abdomen Limited RUQ (LIVER/GB)  Result Date: 03/27/2021 CLINICAL DATA:  Abdominal pain. EXAM: ULTRASOUND ABDOMEN LIMITED RIGHT UPPER QUADRANT COMPARISON:  CT abdomen and pelvis 03/23/2021 FINDINGS: Gallbladder: Distended gallbladder containing debris/sludge and tiny stones. Pericholecystic fluid. No generalized wall thickening. A positive sonographic Percell Miller sign was noted by the sonographer. Common bile duct: Diameter: 5 mm Liver: Suboptimally evaluated due to bowel gas. No gross mass identified. Portal vein is patent on color Doppler imaging with normal direction of blood flow towards the liver. Other: Right pleural effusion. IMPRESSION: Distended gallbladder containing debris/sludge and tiny stones with pericholecystic fluid and positive sonographic Murphy sign concerning for acute cholecystitis. Electronically Signed   By: Logan Bores M.D.   On: 03/27/2021 15:21   US THORACENTESIS ASP PLEURAL SPACE W/IMG GUIDE  Result Date: 04/01/2021 INDICATION: Patient with history of end-stage renal disease, AFib, acute cholecystitis status post percutaneous cholecystostomy placement now with worsening bilateral pleural effusions. Request to IR for diagnostic and therapeutic right thoracentesis EXAM: ULTRASOUND GUIDED RIGHT THORACENTESIS MEDICATIONS: 7 mL 1% lidocaine COMPLICATIONS: None immediate. PROCEDURE: An ultrasound guided thoracentesis was thoroughly discussed with the patient and questions answered. The benefits, risks, alternatives and complications were also discussed. The patient understands and wishes to proceed with the procedure. Written consent was obtained. Ultrasound was performed to localize and mark an adequate pocket of fluid in the RIGHT chest. The area was then prepped and draped in the normal sterile fashion. 1%  Lidocaine was used for local anesthesia. Under ultrasound guidance a 6 Fr Safe-T-Centesis catheter was introduced. Thoracentesis was performed. The catheter was removed and a dressing applied. FINDINGS: A total of approximately 1.1 L of dark red fluid was removed. Samples were sent to the laboratory as requested by the clinical team. IMPRESSION: Successful ultrasound guided RIGHT thoracentesis yielding 1.1 L of pleural fluid. Read by Candiss Norse, PA-C Electronically Signed   By: Michaelle Birks M.D.   On: 04/01/2021 19:20     TODAY-DAY OF DISCHARGE:  Subjective:   Elijah White today is lying comfortably in bed-he is awake and alert and is requesting discharge back to SNF.  Objective:   Blood pressure 137/66, pulse 69, temperature 98.7 F (37.1 C), temperature source Oral, resp. rate 16, weight 67.8 kg, SpO2 98 %.  Intake/Output Summary (Last 24 hours) at 04/13/2021 0959 Last data filed at 04/12/2021 1310 Gross per 24 hour  Intake 0 ml  Output 1700 ml  Net -1700 ml   Filed Weights   04/12/21 0506 04/12/21 0743 04/13/21 0500  Weight: 64.2 kg 64.4 kg 67.8 kg    Exam: Not in distress, No new F.N deficits, Normal affect [place].AT,PERRAL Supple Neck,No JVD, No cervical lymphadenopathy appriciated.  Symmetrical Chest wall movement, Good air movement bilaterally, CTAB RRR,No Gallops,Rubs or new Murmurs, No Parasternal Heave +ve B.Sounds, Abd Soft, Non tender, No organomegaly appriciated, No rebound -guarding or rigidity. No Cyanosis, Clubbing or edema, No new Rash or bruise   PERTINENT RADIOLOGIC STUDIES: No results found.   PERTINENT LAB RESULTS: CBC: Recent Labs    04/12/21 0437  WBC 12.3*  HGB  9.3*  HCT 27.0*  PLT 346   CMET CMP     Component Value Date/Time   NA 135 04/12/2021 0123   NA 134 (A) 09/11/2019 0000   K 3.4 (L) 04/12/2021 0123   CL 101 04/12/2021 0123   CO2 20 (L) 04/12/2021 0123   GLUCOSE 158 (H) 04/12/2021 0123   BUN 38 (H) 04/12/2021 0123   BUN 34  (A) 09/11/2019 0000   CREATININE 7.48 (H) 04/12/2021 0123   CALCIUM 9.0 04/12/2021 0123   PROT 5.1 (L) 04/10/2021 0257   PROT 6.9 02/19/2018 1014   ALBUMIN 1.9 (L) 04/12/2021 0123   ALBUMIN 4.2 02/19/2018 1014   AST 12 (L) 04/10/2021 0257   ALT 5 04/10/2021 0257   ALKPHOS 120 04/10/2021 0257   BILITOT 0.6 04/10/2021 0257   BILITOT 0.5 02/19/2018 1014   GFRNONAA 7 (L) 04/12/2021 0123   GFRAA 7 (L) 12/03/2019 0710    GFR Estimated Creatinine Clearance: 6.6 mL/min (A) (by C-G formula based on SCr of 7.48 mg/dL (H)). No results for input(s): LIPASE, AMYLASE in the last 72 hours. No results for input(s): CKTOTAL, CKMB, CKMBINDEX, TROPONINI in the last 72 hours. Invalid input(s): POCBNP No results for input(s): DDIMER in the last 72 hours. No results for input(s): HGBA1C in the last 72 hours. No results for input(s): CHOL, HDL, LDLCALC, TRIG, CHOLHDL, LDLDIRECT in the last 72 hours. No results for input(s): TSH, T4TOTAL, T3FREE, THYROIDAB in the last 72 hours.  Invalid input(s): FREET3 No results for input(s): VITAMINB12, FOLATE, FERRITIN, TIBC, IRON, RETICCTPCT in the last 72 hours. Coags: No results for input(s): INR in the last 72 hours.  Invalid input(s): PT Microbiology: Recent Results (from the past 240 hour(s))  Resp Panel by RT-PCR (Flu A&B, Covid) Nasopharyngeal Swab     Status: None   Collection Time: 04/05/21  9:59 AM   Specimen: Nasopharyngeal Swab; Nasopharyngeal(NP) swabs in vial transport medium  Result Value Ref Range Status   SARS Coronavirus 2 by RT PCR NEGATIVE NEGATIVE Final    Comment: (NOTE) SARS-CoV-2 target nucleic acids are NOT DETECTED.  The SARS-CoV-2 RNA is generally detectable in upper respiratory specimens during the acute phase of infection. The lowest concentration of SARS-CoV-2 viral copies this assay can detect is 138 copies/mL. A negative result does not preclude SARS-Cov-2 infection and should not be used as the sole basis for treatment  or other patient management decisions. A negative result may occur with  improper specimen collection/handling, submission of specimen other than nasopharyngeal swab, presence of viral mutation(s) within the areas targeted by this assay, and inadequate number of viral copies(<138 copies/mL). A negative result must be combined with clinical observations, patient history, and epidemiological information. The expected result is Negative.  Fact Sheet for Patients:  EntrepreneurPulse.com.au  Fact Sheet for Healthcare Providers:  IncredibleEmployment.be  This test is no t yet approved or cleared by the [place] FDA and  has been authorized for detection and/or diagnosis of SARS-CoV-2 by FDA under an Emergency Use Authorization (EUA). This EUA will remain  in effect (meaning this test can be used) for the duration of the COVID-19 declaration under Section 564(b)(1) of the Act, 21 U.S.C.section 360bbb-3(b)(1), unless the authorization is terminated  or revoked sooner.       Influenza A by PCR NEGATIVE NEGATIVE Final   Influenza B by PCR NEGATIVE NEGATIVE Final    Comment: (NOTE) The Xpert Xpress SARS-CoV-2/FLU/RSV plus assay is intended as an aid in the diagnosis of influenza from Nasopharyngeal  swab specimens and should not be used as a sole basis for treatment. Nasal washings and aspirates are unacceptable for Xpert Xpress SARS-CoV-2/FLU/RSV testing.  Fact Sheet for Patients: EntrepreneurPulse.com.au  Fact Sheet for Healthcare Providers: IncredibleEmployment.be  This test is not yet approved or cleared by the [place] FDA and has been authorized for detection and/or diagnosis of SARS-CoV-2 by FDA under an Emergency Use Authorization (EUA). This EUA will remain in effect (meaning this test can be used) for the duration of the COVID-19 declaration under Section 564(b)(1) of the Act, 21 U.S.C. section  360bbb-3(b)(1), unless the authorization is terminated or revoked.  Performed at [facility], [place] [address], [place], [place] [address]   MRSA Next Gen by PCR, Nasal     Status: None   Collection Time: 04/07/21  2:30 PM   Specimen: Nasopharyngeal Swab; Nasal Swab  Result Value Ref Range Status   MRSA by PCR Next Gen NOT DETECTED NOT DETECTED Final    Comment: (NOTE) The GeneXpert MRSA Assay (FDA approved for NASAL specimens only), is one component of a comprehensive MRSA colonization surveillance program. It is not intended to diagnose MRSA infection nor to guide or monitor treatment for MRSA infections. Test performance is not FDA approved in patients less than 68 years old. Performed at [facility], [place] [address] [place], [place] [address]   Resp Panel by RT-PCR (Flu A&B, Covid) Nasopharyngeal Swab     Status: None   Collection Time: 04/07/21  3:02 PM   Specimen: Nasopharyngeal Swab; Nasopharyngeal(NP) swabs in vial transport medium  Result Value Ref Range Status   SARS Coronavirus 2 by RT PCR NEGATIVE NEGATIVE Final    Comment: (NOTE) SARS-CoV-2 target nucleic acids are NOT DETECTED.  The SARS-CoV-2 RNA is generally detectable in upper respiratory specimens during the acute phase of infection. The lowest concentration of SARS-CoV-2 viral copies this assay can detect is 138 copies/mL. A negative result does not preclude SARS-Cov-2 infection and should not be used as the sole basis for treatment or other patient management decisions. A negative result may occur with  improper specimen collection/handling, submission of specimen other than nasopharyngeal swab, presence of viral mutation(s) within the areas targeted by this assay, and inadequate number of viral copies(<138 copies/mL). A negative result must be combined with clinical observations, patient history, and epidemiological information. The expected result is Negative.  Fact Sheet for Patients:   EntrepreneurPulse.com.au  Fact Sheet for Healthcare Providers:  IncredibleEmployment.be  This test is no t yet approved or cleared by the [place] FDA and  has been authorized for detection and/or diagnosis of SARS-CoV-2 by FDA under an Emergency Use Authorization (EUA). This EUA will remain  in effect (meaning this test can be used) for the duration of the COVID-19 declaration under Section 564(b)(1) of the Act, 21 U.S.C.section 360bbb-3(b)(1), unless the authorization is terminated  or revoked sooner.       Influenza A by PCR NEGATIVE NEGATIVE Final   Influenza B by PCR NEGATIVE NEGATIVE Final    Comment: (NOTE) The Xpert Xpress SARS-CoV-2/FLU/RSV plus assay is intended as an aid in the diagnosis of influenza from Nasopharyngeal swab specimens and should not be used as a sole basis for treatment. Nasal washings and aspirates are unacceptable for Xpert Xpress SARS-CoV-2/FLU/RSV testing.  Fact Sheet for Patients: EntrepreneurPulse.com.au  Fact Sheet for Healthcare Providers: IncredibleEmployment.be  This test is not yet approved or cleared by the [place] FDA and has been authorized for detection and/or  diagnosis of SARS-CoV-2 by FDA under an Emergency Use Authorization (EUA). This EUA will remain in effect (meaning this test can be used) for the duration of the COVID-19 declaration under Section 564(b)(1) of the Act, 21 U.S.C. section 360bbb-3(b)(1), unless the authorization is terminated or revoked.  Performed at [facility], [place] [address] [place], [place] [address]   Culture, blood (routine x 2)     Status: None (Preliminary result)   Collection Time: 04/08/21  1:33 AM   Specimen: BLOOD  Result Value Ref Range Status   Specimen Description BLOOD RIGHT ANTECUBITAL  Final   Special Requests AEROBIC BOTTLE ONLY Blood Culture adequate volume  Final   Culture   Final    NO GROWTH 4  DAYS Performed at [facility], [place] [address], [place], [place] [address]    Report Status PENDING  Incomplete  Culture, blood (routine x 2)     Status: None (Preliminary result)   Collection Time: 04/08/21  1:33 AM   Specimen: BLOOD RIGHT HAND  Result Value Ref Range Status   Specimen Description BLOOD RIGHT HAND  Final   Special Requests AEROBIC BOTTLE ONLY Blood Culture adequate volume  Final   Culture   Final    NO GROWTH 4 DAYS Performed at [facility], [place] [address], [place], [place] [address]    Report Status PENDING  Incomplete    FURTHER DISCHARGE INSTRUCTIONS:  Get Medicines reviewed and adjusted: Please take all your medications with you for your next visit with your Primary MD  Laboratory/radiological data: Please request your Primary MD to go over all hospital tests and procedure/radiological results at the follow up, please ask your Primary MD to get all Hospital records sent to his/her office.  In some cases, they will be blood work, cultures and biopsy results pending at the time of your discharge. Please request that your primary care M.D. goes through all the records of your hospital data and follows up on these results.  Also Note the following: If you experience worsening of your admission symptoms, develop shortness of breath, life threatening emergency, suicidal or homicidal thoughts you must seek medical attention immediately by calling 911 or calling your MD immediately  if symptoms less severe.  You must read complete instructions/literature along with all the possible adverse reactions/side effects for all the Medicines you take and that have been prescribed to you. Take any new Medicines after you have completely understood and accpet all the possible adverse reactions/side effects.   Do not drive when taking Pain medications or sleeping medications (Benzodaizepines)  Do not take more than prescribed Pain, Sleep and Anxiety Medications. It  is not advisable to combine anxiety,sleep and pain medications without talking with your primary care practitioner  Special Instructions: If you have smoked or chewed Tobacco  in the last 2 yrs please stop smoking, stop any regular Alcohol  and or any Recreational drug use.  Wear Seat belts while driving.  Please note: You were cared for by a hospitalist during your hospital stay. Once you are discharged, your primary care physician will handle any further medical issues. Please note that NO REFILLS for any discharge medications will be authorized once you are discharged, as it is imperative that you return to your primary care physician (or establish a relationship with a primary care physician if you do not have one) for your post hospital discharge needs so that they can reassess your need for medications and monitor your  lab values.  Total Time spent coordinating discharge including counseling, education and face to face time equals greater than 30 minutes.  SignedOren Binet 04/13/2021 9:59 AM

## 2021-04-18 ENCOUNTER — Encounter: Payer: Self-pay | Admitting: Internal Medicine

## 2021-04-18 ENCOUNTER — Non-Acute Institutional Stay: Payer: Medicare Other | Admitting: Internal Medicine

## 2021-04-18 ENCOUNTER — Ambulatory Visit: Payer: Medicare Other | Admitting: Physician Assistant

## 2021-04-18 ENCOUNTER — Other Ambulatory Visit: Payer: Self-pay

## 2021-04-18 VITALS — BP 137/74 | HR 85 | Resp 19

## 2021-04-18 DIAGNOSIS — K811 Chronic cholecystitis: Secondary | ICD-10-CM

## 2021-04-18 DIAGNOSIS — R110 Nausea: Secondary | ICD-10-CM

## 2021-04-18 DIAGNOSIS — N186 End stage renal disease: Secondary | ICD-10-CM

## 2021-04-18 DIAGNOSIS — T8579XA Infection and inflammatory reaction due to other internal prosthetic devices, implants and grafts, initial encounter: Secondary | ICD-10-CM

## 2021-04-18 DIAGNOSIS — Z515 Encounter for palliative care: Secondary | ICD-10-CM

## 2021-04-18 DIAGNOSIS — R066 Hiccough: Secondary | ICD-10-CM

## 2021-04-18 NOTE — Progress Notes (Signed)
Therapist, nutritional Palliative Care Follow-Up Visit Telephone: [phone]  Fax: [phone]   Date of encounter: 04/18/21 10:50 AM PATIENT NAME: Elijah White [address] [place] [address]   [phone] (home)  DOB: 1940-03-18 MRN: [id number] PRIMARY CARE PROVIDER:    Charlott Rakes, MD,  [address] [place] [address] [phone]  REFERRING PROVIDER:   Charlott Rakes, MD [address] [place],  [place] [address] [phone]  RESPONSIBLE PARTY:    Contact Information     Name Relation Home Work [place] Daughter [phone]  [phone]   [facility] Niece   [phone]        I met face to face with patient and family in [facility] home facility. Palliative Care was asked to follow this patient by consultation request of  Charlott Rakes, MD to address advance care planning and complex medical decision making. This is follow-up visit.                                     ASSESSMENT AND PLAN / RECOMMENDATIONS:   Advance Care Planning/Goals of Care: Goals include to maximize quality of life and symptom management. Patient/health care surrogate gave his/her permission to discuss.Our advance care planning conversation included a discussion about:    The value and importance of advance care planning  Experiences with loved ones who have been seriously ill or have died  Exploration of personal, cultural or spiritual beliefs that might influence medical decisions  Exploration of goals of care in the event of a sudden injury or illness  Identification  of a healthcare agent  Review and updating or creation of an  advance directive document . Decision not to resuscitate or to de-escalate disease focused treatments due to poor prognosis. CODE STATUS:  DNR Spoke with [name], pt's daughter, by phone today after visit with patient.  Also discussed with primary NP, Tenna Delaine, who was present  for call.  Archie Patten shares that she is herself getting cancer treatments and cannot visit her father as often as she'd like as a result.  She also went away over the weekend so she was not in the building when her brother came to see her dad.  He will be back this weekend and they will try to go see him then and talk with him about hospice care.  At this time, though her dad is dealing with hiccups and nausea off and on, she believes, he is eating better and improved from when he returned from his last admission early this month.  She says he is "not at the hospice point" and she would still like for him to be sent back to the hospital if he has another episode of cholecystitis or otherwise becomes acutely ill.  The notes from his last admission did indicate also that she wanted to make further decisions if he had another hospitalization.  She is holding to this at this time.  She also notes that [name] continues to want to receive hemodialysis. I provided education about how we anticipate that he will have recurrent infections related to his cholecystitis and pneumonia.  It's noted that he's had more difficulty tolerating HD and his last admission was related to hypotension there so midodrine was added.  He is not able to participate in therapy.   I did explain that there is an  option not to go back to the hospital and receive comfort care here.  They are not ready to stop HD.   Discussed delirium during prior admissions made worse by the presence of his legal blindness.  [name] expressed that he's had this for a long time now and it's not new and can even be an issue with new staff at [facility] which is all true information.   I offered to meet with her in person, virtually or by phone again next week after she speaks with her brother and father this weekend.  She was given my number and advised she could contact me directly or Vaughan Basta at [employer].    Symptom Management/Plan: 1. Cholecystitis,  chronic -patient with drain from IR in place for this -high risk for recurrent infections due to multiple comorbidities and associated immunocompromise, unable to have cholecystectomy to remove source of infection despite multiple courses of abx, also has recurrent aspiration -continue to monitor for fever, tachycardia, RUQ pain, jaundice/cholangitis/sepsis -has not had resolution of white blood cell count based on latest labs from 2/11 with wbc 13  2. Cholecystostomy drain infection, initial encounter ([place]) -previously had obstruction and drain was replaced during admission -continues currently to drain dark brown bilious material  3. ESRD (end stage renal disease) ([place]) -continues on HD, has secondary HPT, as well  4. Nausea -related to chronic infection, does respond to zofran which he received this am and has now been able to eat some breakfast -cont zofran  5. Chronic hiccups -suspect due to inflammation around gallbladder and lung on right leading to phrenic nerve irritation  -option of haldol for this, but this will be sedating for him  6. Palliative care by specialist -does have DNR code status; however, current goals include readmission to hospital in event of acute illness that cannot be managed adequately in SNF -this discussion was consistent with one primary NP also had with [name] previously  -pt was not open to discussion during visit this am after I introduced myself as being from palliative care--I explained what it was, but he only wanted to eat his breakfast and listen to his radio--I got the impression that he may have been avoiding the conversation -see other details above.  Follow up Palliative Care Visit: Palliative care will continue to follow for complex medical decision making, advance care planning, and clarification of goals. Return 1-2 weeks or prn.  This visit was coded based on medical decision making (MDM). 23 minutes spent on ACP including 5 minute  discussion with primary NP, 13 minute conversation with his daughter [name] and 5 minutes with patient himself.    PPS: 40%  HOSPICE ELIGIBILITY/DIAGNOSIS: Not at present/ESRD, also chronic cholecystitis.  Chief Complaint: Follow-up palliative visit  HISTORY OF PRESENT ILLNESS:  Elijah White is a 81 y.o. 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    He has had three recent admissions to the hospital related to his cholecystitis as well as HD-related hypotension.    1/11, he was hospitalized with recurrent RUQ pain and found to have cholecystitis and aspiration pneumonia.  He had a temporary GB drain placed by IR 1/12 then removed 1/17.  He was treated with IV abx.  1/23, he had another admission with acute cholecystitis and the drain was replaced 1/24.  He was also noted to have right-sided  pneumonia again and a pleural effusion.    2/3, he was admitted a third time, but with hypotension--sent directly from HD.  He was placed on midodrine for this.  He also was noted to have a 2.5cm adrenal nodule.    He has urinary retention and does still make urine.  He has a foley.    History obtained from review of EMR, discussion with primary team, and interview with family, facility staff/caregiver and/or Mr. Peets.  I reviewed available labs, medications, imaging, studies and related documents from the EMR.  Records reviewed and summarized above.   ROS  General: NAD EYES: has legal blindness ENMT: has dysphagia Cardiovascular: denies chest pain, denies DOE, has AV fistula Pulmonary: denies cough, denies increased SOB, reports hiccups Abdomen: endorses fair appetite--eating better per [name]--is being fed by caregiver, denies constipation, endorses incontinence of bowel, has RUQ cholecystostomy drain in place with dark brown  bilious fluid in bag GU: denies dysuria, urine--has foley with small amount dark yellow urine MSK:  has increased weakness,  no falls reported Skin: denies rashes or wounds Neurological: denies pain, denies insomnia Psych:was not in good spirits this am Heme/lymph/immuno: denies bruises, abnormal bleeding  Physical Exam: Current and past weights:  149.47 lbs 04/07/21 down from 158 lbs in 08/2020 Constitutional: NAD General: frail appearing, thin EYES: anicteric sclera, lids intact, no discharge, legally blind ENMT: intact hearing, oral mucous membranes moist, dentition intact CV:  no LE edema, AV fistula Pulmonary: no increased work of breathing, no cough, room air Abdomen: intake 25-50%, normo-active BS + 4 quadrants, soft and non tender, no ascites, RUQ drain with dark brown bilious material in bag GU: deferred MSK: sarcopenia, moves all extremities, L BKA, nonambulatory Skin: warm and dry, no rashes or wounds on visible skin Neuro:  generalized weakness,  mild cognitive impairment--some sundowning reported Psych: resistant to conversation, Alert Hem/lymph/immuno: no widespread bruising CURRENT PROBLEM LIST:  Patient Active Problem List   Diagnosis Date Noted   Delirium 04/12/2021   Hypotension 04/07/2021   Parkinson's disease ([place]) 04/07/2021   Acute urinary retention with foley  04/07/2021   Pneumonia during recent hospitalization  04/07/2021   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 04/07/2021   Paroxysmal atrial fibrillation ([place]) 04/07/2021   Pressure injury of skin 03/28/2021   PAD (peripheral artery disease) ([place])    Complicated UTI (urinary tract infection)    Acute cholecystitis 03/15/2021   Hypokalemia 09/28/2020   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, 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[place]) 08/12/2020   Blindness/glaucoma  01/19/2020   Acute on chronic diastolic heart failure ([place]) 01/19/2020   RBBB    Hypercalcemia 12/16/2019   Near syncope 12/13/2019   Moderate protein-calorie  malnutrition ([place]) 12/04/2019   Severe aortic stenosis s/p TAVR     ESRD (end stage renal disease) ([place]) 11/26/2019   Macrocytic anemia 11/26/2019   2.5 cm left adrenal nodule 11/26/2019   Colonic mass 11/26/2019   Allergy, unspecified, initial encounter 11/16/2019   Anaphylactic shock, unspecified, initial encounter 11/16/2019   Other long term (current) drug therapy 09/05/2019   Iron deficiency anemia, unspecified 07/13/2019   Palliative care encounter 04/17/2019   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   Dependence on renal dialysis ([place]) 04/16/2019   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 kidney disease, or unspecified chronic kidney disease 04/16/2019   Localized edema 04/16/2019   Nicotine dependence, unspecified, uncomplicated 03/55/9741   Other disorders of phosphorus metabolism 04/16/2019   Pain, unspecified 04/16/2019   Pruritus, unspecified  04/16/2019   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   Shortness of breath 04/16/2019   Unspecified glaucoma 04/16/2019   Unspecified urinary incontinence 04/16/2019   ARF (acute renal failure) (HCC) 03/25/2018   Vision loss of right eye 11/11/2017   Bilateral pseudophakia 07/11/2017   GERD (gastroesophageal reflux disease)/esophageal candidiasis  09/10/2016   HLD (hyperlipidemia) 09/10/2016   Essential hypertension 08/13/2016   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) 10/22/2006   COLONIC POLYPS, HX OF 10/22/2006   PAST MEDICAL HISTORY:  Active Ambulatory Problems    Diagnosis Date Noted   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) 10/22/2006   COLONIC POLYPS, HX OF 10/22/2006   Essential hypertension 08/13/2016   Bilateral pseudophakia 07/11/2017   GERD (gastroesophageal reflux disease)/esophageal candidiasis  09/10/2016   HLD (hyperlipidemia) 09/10/2016   Primary  open-angle glaucoma 10/29/2012   Vision loss of right eye 11/11/2017   ARF (acute renal failure) (HCC) 03/25/2018   ESRD (end stage renal disease) (HCC) 11/26/2019   Macrocytic anemia 11/26/2019   2.5 cm left adrenal nodule 11/26/2019   Colonic mass 11/26/2019   Severe aortic stenosis s/p TAVR     Near syncope 12/13/2019   RBBB    Blindness/glaucoma  01/19/2020   Acute on chronic diastolic heart failure (HCC) 01/19/2020   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, 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HCC) 08/12/2020   Allergy, unspecified, initial encounter 11/16/2019   Anaphylactic shock, unspecified, initial encounter 11/16/2019   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   Dependence on renal dialysis (HCC) 04/16/2019   Palliative care encounter 04/17/2019   Hypercalcemia 12/16/2019   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 kidney disease, or unspecified chronic kidney disease 04/16/2019   Hypokalemia 09/28/2020   Iron deficiency anemia, unspecified 07/13/2019   Localized edema 04/16/2019   Moderate protein-calorie malnutrition (HCC) 12/04/2019   Nicotine dependence, unspecified, uncomplicated 04/16/2019   Other disorders of phosphorus metabolism 04/16/2019   Other long term (current) drug therapy 09/05/2019   Pain, unspecified 04/16/2019   Pruritus, unspecified 04/16/2019   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   Shortness of breath 04/16/2019   Unspecified glaucoma 04/16/2019   Unspecified urinary incontinence 04/16/2019   Acute cholecystitis 03/15/2021   Complicated UTI (urinary tract infection)    PAD (peripheral artery disease) (HCC)    Pressure injury of skin 03/28/2021   Hypotension 04/07/2021   Parkinson's disease (HCC) 04/07/2021   Acute urinary retention with foley  04/07/2021   Pneumonia during recent hospitalization  04/07/2021   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 04/07/2021   Paroxysmal atrial fibrillation (HCC) 04/07/2021    Delirium 04/12/2021   Resolved Ambulatory Problems    Diagnosis Date Noted   BPH (benign prostatic hyperplasia) 10/22/2006   Dizziness 08/13/2016   Renal failure (ARF), acute on chronic (HCC) 08/13/2016   Acute decompensated heart failure (HCC) 09/01/2017   Cataract, nuclear 10/29/2012   Choroidal detachment of left eye 07/11/2017   DM type 2 controlled with CKD 4 02/20/2018   Chronic diastolic CHF (congestive heart failure) (HCC) 03/25/2018   Chest pain 11/26/2019   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HCC) 08/23/2020   Malignant neoplasm of prostate (HCC) 04/16/2019   Alcoholic hepatitis with ascites 03/15/2021   DNR (do not resuscitate)    Sepsis (HCC) 04/07/2021   History of pneumonia 04/07/2021  Past Medical History:  Diagnosis Date   Anemia    Aortic stenosis    Arthritis    Cancer ([place])    Colon polyps ~ 1993 and 2003   Diabetes mellitus without complication ([place])    Elevated cholesterol with high triglycerides    Glaucoma    Hypertension    Legally blind in left eye, as defined in [place]    SOCIAL HX:  Social History   Tobacco Use   Smoking status: Former    Packs/day: 0.50    Types: Cigarettes    Quit date: 03/23/2018    Years since quitting: 3.0   Smokeless tobacco: Never  Substance Use Topics   Alcohol use: No     ALLERGIES: No Known Allergies   PERTINENT MEDICATIONS:   MED SENEXON-S 50-8.6 MG TABLET TAKE 1 TABLET BY MOUTH AT BEDTIME (DO NOT CRUSH) DeMarchi, William 04/14/21, 10:00 PM MED HYDROXYZINE HCL 25 MG TABLET TAKE 1 TABLET BY MOUTH EVERY 8 HOURS AS NEEDED FOR 14 DAYS FOR ITCHING DeMarchi, William 04/14/21, 11:30 AM MED SEVELAMER CARBONATE 800 MG TAB TAKE 2 TABLETS ($RemoveBe'1600MG'SpElNqVjN$ ) BY MOUTH THREE TIMES A DAY WITH MEALS (DO NOT CRUSH) DeMarchi, William 04/14/21, 11:30 AM MED POLYETHYLENE GLYCOL 3350 POWD MIX 17 GRAM IN 4 OUNCE OF WATER AND GIVE BY MOUTH ONCE DAILY FOR  CONSTIPATION DeMarchi, William 04/14/21, 11:30 AM MED MIDODRINE HCL 10 MG TABLET TAKE 1 TABLET BY MOUTH THREE TIMES A DAY WITH MEALS DeMarchi, William 04/14/21, 11:30 AM MED PANTOPRAZOLE SOD DR 40 MG TAB TAKE 1 TABLET BY MOUTH TWICE DAILY (DO NOT CRUSH) (RtP) Gastro-esophageal reflux disease without esophagitis (K21.9) DeMarchi, Gwyndolyn Saxon 04/13/21, 6:37 PM MED ACETAMINOPHEN 500 MG TABLET :Give 2 tablets ( 1,000 mg total ) by mouth every 8 houirs as needed for moderate pain or headache DeMarchi, Gwyndolyn Saxon 04/13/21, 6:33 PM MED ROCKLATAN 0.02%-0.005% EYE DRP: Place 1 drop into the right eye at bedtime (RtP) Unspecified glaucoma (H40.9) DeMarchi, Gwyndolyn Saxon 04/13/21, 1:14 PM MED PILOCARPINE 4% EYE DROPS INSTILL 1 DROP IN THE RIGHT EYE 4 TIMES A DAY DeMarchi, William 04/13/21, 1:12 PM MED NYSTATIN 100,000 UNIT/ML SUSP TAKE 5 ML BY MOUTH 4 TIMES A DAY DeMarchi, William 04/13/21, 1:11 PM MED MELATONIN 5 MG TABLET TAKE 1 TABLET BY MOUTH AT BEDTIME DeMarchi, Gwyndolyn Saxon 04/13/21, 1:07 PM MED GENTEAL TEARS SEVERE 0.3% GEL INSTILL 1 DROP IN THE LEFT EYE EVERY 12 HOURS AS NEEDED - DRY EYES (WAIT 3-5MIN BETWEEN DIFFERENT EYE DROPS) DeMarchi, Gwyndolyn Saxon 04/13/21, 1:05 PM MED GABAPENTIN 100 MG CAPSULE TAKE 1 CAPSULE BY MOUTH ONCE DAILY (DO NOT CRUSH) DeMarchi, Gwyndolyn Saxon 04/13/21, 1:04 PM MED AURYXIA 210 MG TABLET :Give 2 tablets ( 420 mg total ) by mouth three times daily with meals End stage renal disease (N18.6) DeMarchi, Gwyndolyn Saxon 04/13/21, 1:02 PM MED DORZOLAMIDE-TIMOLOL EYE DROPS INSTILL 1 DROP IN THE RIGHT EYE TWICE A DAY (WAIT 3-5MIN BETWEEN DIFFERENT EYE DROPS) DeMarchi, William 04/13/21, 1:00 PM MED DIALYVITE 800-ZINC 15 MG TAB TAKE 1 TABLET BY MOUTH EVERY MORNING DeMarchi, William 04/13/21, 12:58 PM MED CLOPIDOGREL 75 MG TABLET TAKE 1 TABLET BY MOUTH ONCE DAILY WITH BREAKFAST (RtP) 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, 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) DeMarchi, Gwyndolyn Saxon 04/13/21, 12:53 PM MED CINACALCET HCL 30  MG TABLET TAKE 2 TABLETS ( $RemoveB'60MG'qOoQHxeL$  ) BY MOUTH ON MONDAY, WEDNESDAY, AND FRIDAY WITH HEMODIALYSIS DeMarchi, Gwyndolyn Saxon 04/13/21, 12:51 PM MED BRIMONIDINE TARTRATE 0.15% DRP: place 1 drop in the right eye three times a day Madison Hickman 04/13/21, 12:50 PM MED CARBIDOPA-LEVODOPA  25-100 TAB TAKE 1 TABLET BY MOUTH THREE TIMES A DAY Parkinson's disease (G20) DeMarchi, Gwyndolyn Saxon 04/13/21, 12:50 PM MED BACLOFEN 5 MG TABLET:Give 1 tablet by mouth three times daily as needed for hiccups DeMarchi, Gwyndolyn Saxon 04/13/21, 12:48 PM MED ATORVASTATIN 10 MG TABLET TAKE 1 TABLET BY MOUTH EVERY MORNING Hyperlipidemia, unspecified (E78.5) DeMarchi, Gwyndolyn Saxon 04/13/21, 12:47 PM  Thank you for the opportunity to participate in the care of Mr. Massie.  The palliative care team will continue to follow. Please call our office at [phone] if we can be of additional assistance.   Hollace Kinnier, DO  COVID-19 PATIENT SCREENING TOOL Asked and negative response unless otherwise noted:  Have you had symptoms of covid, tested positive or been in contact with someone with symptoms/positive test in the past 5-10 days? no

## 2021-04-21 ENCOUNTER — Ambulatory Visit: Payer: Medicare Other | Admitting: Podiatry

## 2021-04-22 ENCOUNTER — Emergency Department (HOSPITAL_COMMUNITY): Payer: Medicare Other

## 2021-04-22 ENCOUNTER — Inpatient Hospital Stay (HOSPITAL_COMMUNITY)
Admission: EM | Admit: 2021-04-22 | Discharge: 2021-04-25 | DRG: 444 | Disposition: A | Discharge status: Skilled Nursing Facility | Payer: Medicare Other | Source: Skilled Nursing Facility | Attending: Internal Medicine | Admitting: Internal Medicine

## 2021-04-22 ENCOUNTER — Encounter (HOSPITAL_COMMUNITY): Payer: Self-pay | Admitting: *Deleted

## 2021-04-22 ENCOUNTER — Other Ambulatory Visit: Payer: Self-pay

## 2021-04-22 DIAGNOSIS — B3781 Candidal esophagitis: Secondary | ICD-10-CM | POA: Diagnosis present

## 2021-04-22 DIAGNOSIS — E781 Pure hyperglyceridemia: Secondary | ICD-10-CM | POA: Diagnosis present

## 2021-04-22 DIAGNOSIS — I9589 Other hypotension: Secondary | ICD-10-CM | POA: Diagnosis present

## 2021-04-22 DIAGNOSIS — I953 Hypotension of hemodialysis: Secondary | ICD-10-CM | POA: Diagnosis present

## 2021-04-22 DIAGNOSIS — Z9189 Other specified personal risk factors, not elsewhere classified: Secondary | ICD-10-CM

## 2021-04-22 DIAGNOSIS — E861 Hypovolemia: Secondary | ICD-10-CM | POA: Diagnosis present

## 2021-04-22 DIAGNOSIS — W06XXXA Fall from bed, initial encounter: Secondary | ICD-10-CM | POA: Diagnosis present

## 2021-04-22 DIAGNOSIS — I120 Hypertensive chronic kidney disease with stage 5 chronic kidney disease or end stage renal disease: Secondary | ICD-10-CM | POA: Diagnosis present

## 2021-04-22 DIAGNOSIS — Z7902 Long term (current) use of antithrombotics/antiplatelets: Secondary | ICD-10-CM

## 2021-04-22 DIAGNOSIS — E785 Hyperlipidemia, unspecified: Secondary | ICD-10-CM | POA: Diagnosis present

## 2021-04-22 DIAGNOSIS — R112 Nausea with vomiting, unspecified: Principal | ICD-10-CM | POA: Diagnosis present

## 2021-04-22 DIAGNOSIS — D631 Anemia in chronic kidney disease: Secondary | ICD-10-CM | POA: Diagnosis present

## 2021-04-22 DIAGNOSIS — Z515 Encounter for palliative care: Secondary | ICD-10-CM | POA: Diagnosis not present

## 2021-04-22 DIAGNOSIS — J90 Pleural effusion, not elsewhere classified: Secondary | ICD-10-CM | POA: Diagnosis present

## 2021-04-22 DIAGNOSIS — K8012 Calculus of gallbladder with acute and chronic cholecystitis without obstruction: Secondary | ICD-10-CM | POA: Diagnosis present

## 2021-04-22 DIAGNOSIS — N186 End stage renal disease: Secondary | ICD-10-CM

## 2021-04-22 DIAGNOSIS — N1330 Unspecified hydronephrosis: Secondary | ICD-10-CM | POA: Diagnosis present

## 2021-04-22 DIAGNOSIS — Z20822 Contact with and (suspected) exposure to covid-19: Secondary | ICD-10-CM | POA: Diagnosis present

## 2021-04-22 DIAGNOSIS — Z66 Do not resuscitate: Secondary | ICD-10-CM | POA: Diagnosis present

## 2021-04-22 DIAGNOSIS — K8010 Calculus of gallbladder with chronic cholecystitis without obstruction: Secondary | ICD-10-CM | POA: Diagnosis present

## 2021-04-22 DIAGNOSIS — Z952 Presence of prosthetic heart valve: Secondary | ICD-10-CM

## 2021-04-22 DIAGNOSIS — L89153 Pressure ulcer of sacral region, stage 3: Secondary | ICD-10-CM | POA: Diagnosis present

## 2021-04-22 DIAGNOSIS — N2581 Secondary hyperparathyroidism of renal origin: Secondary | ICD-10-CM | POA: Diagnosis present

## 2021-04-22 DIAGNOSIS — Z8249 Family history of ischemic heart disease and other diseases of the circulatory system: Secondary | ICD-10-CM

## 2021-04-22 DIAGNOSIS — K219 Gastro-esophageal reflux disease without esophagitis: Secondary | ICD-10-CM | POA: Diagnosis present

## 2021-04-22 DIAGNOSIS — E278 Other specified disorders of adrenal gland: Secondary | ICD-10-CM | POA: Diagnosis present

## 2021-04-22 DIAGNOSIS — Z992 Dependence on renal dialysis: Secondary | ICD-10-CM | POA: Diagnosis not present

## 2021-04-22 DIAGNOSIS — M898X9 Other specified disorders of bone, unspecified site: Secondary | ICD-10-CM | POA: Diagnosis present

## 2021-04-22 DIAGNOSIS — I480 Paroxysmal atrial fibrillation: Secondary | ICD-10-CM | POA: Diagnosis present

## 2021-04-22 DIAGNOSIS — E1143 Type 2 diabetes mellitus with diabetic autonomic (poly)neuropathy: Secondary | ICD-10-CM | POA: Diagnosis present

## 2021-04-22 DIAGNOSIS — K317 Polyp of stomach and duodenum: Secondary | ICD-10-CM | POA: Diagnosis present

## 2021-04-22 DIAGNOSIS — W2203XA Walked into furniture, initial encounter: Secondary | ICD-10-CM | POA: Diagnosis present

## 2021-04-22 DIAGNOSIS — J690 Pneumonitis due to inhalation of food and vomit: Secondary | ICD-10-CM | POA: Diagnosis present

## 2021-04-22 DIAGNOSIS — E1129 Type 2 diabetes mellitus with other diabetic kidney complication: Secondary | ICD-10-CM | POA: Diagnosis present

## 2021-04-22 DIAGNOSIS — I4510 Unspecified right bundle-branch block: Secondary | ICD-10-CM | POA: Diagnosis present

## 2021-04-22 DIAGNOSIS — S0093XA Contusion of unspecified part of head, initial encounter: Secondary | ICD-10-CM | POA: Diagnosis present

## 2021-04-22 DIAGNOSIS — H547 Unspecified visual loss: Secondary | ICD-10-CM

## 2021-04-22 DIAGNOSIS — E1151 Type 2 diabetes mellitus with diabetic peripheral angiopathy without gangrene: Secondary | ICD-10-CM | POA: Diagnosis present

## 2021-04-22 DIAGNOSIS — H409 Unspecified glaucoma: Secondary | ICD-10-CM | POA: Diagnosis present

## 2021-04-22 DIAGNOSIS — Z79899 Other long term (current) drug therapy: Secondary | ICD-10-CM

## 2021-04-22 DIAGNOSIS — Z8673 Personal history of transient ischemic attack (TIA), and cerebral infarction without residual deficits: Secondary | ICD-10-CM

## 2021-04-22 DIAGNOSIS — E1122 Type 2 diabetes mellitus with diabetic chronic kidney disease: Secondary | ICD-10-CM | POA: Diagnosis present

## 2021-04-22 DIAGNOSIS — G20 Parkinson's disease: Secondary | ICD-10-CM | POA: Diagnosis present

## 2021-04-22 DIAGNOSIS — Z87891 Personal history of nicotine dependence: Secondary | ICD-10-CM

## 2021-04-22 DIAGNOSIS — Z89512 Acquired absence of left leg below knee: Secondary | ICD-10-CM

## 2021-04-22 DIAGNOSIS — I739 Peripheral vascular disease, unspecified: Secondary | ICD-10-CM | POA: Diagnosis present

## 2021-04-22 DIAGNOSIS — D72829 Elevated white blood cell count, unspecified: Secondary | ICD-10-CM

## 2021-04-22 DIAGNOSIS — Z8719 Personal history of other diseases of the digestive system: Secondary | ICD-10-CM

## 2021-04-22 DIAGNOSIS — H548 Legal blindness, as defined in USA: Secondary | ICD-10-CM | POA: Diagnosis present

## 2021-04-22 DIAGNOSIS — E7800 Pure hypercholesterolemia, unspecified: Secondary | ICD-10-CM | POA: Diagnosis present

## 2021-04-22 LAB — CBC WITH DIFFERENTIAL/PLATELET
Abs Immature Granulocytes: 0 10*3/uL (ref 0.00–0.07)
Basophils Absolute: 0 10*3/uL (ref 0.0–0.1)
Basophils Relative: 0 %
Eosinophils Absolute: 1.8 10*3/uL — ABNORMAL HIGH (ref 0.0–0.5)
Eosinophils Relative: 9 %
HCT: 33.1 % — ABNORMAL LOW (ref 39.0–52.0)
Hemoglobin: 10.5 g/dL — ABNORMAL LOW (ref 13.0–17.0)
Lymphocytes Relative: 4 %
Lymphs Abs: 0.8 10*3/uL (ref 0.7–4.0)
MCH: 34.3 pg — ABNORMAL HIGH (ref 26.0–34.0)
MCHC: 31.7 g/dL (ref 30.0–36.0)
MCV: 108.2 fL — ABNORMAL HIGH (ref 80.0–100.0)
Monocytes Absolute: 1 10*3/uL (ref 0.1–1.0)
Monocytes Relative: 5 %
Neutro Abs: 16.5 10*3/uL — ABNORMAL HIGH (ref 1.7–7.7)
Neutrophils Relative %: 82 %
Platelets: 382 10*3/uL (ref 150–400)
RBC: 3.06 MIL/uL — ABNORMAL LOW (ref 4.22–5.81)
RDW: 16.1 % — ABNORMAL HIGH (ref 11.5–15.5)
WBC: 20.1 10*3/uL — ABNORMAL HIGH (ref 4.0–10.5)
nRBC: 0 % (ref 0.0–0.2)
nRBC: 0 /100 WBC

## 2021-04-22 LAB — COMPREHENSIVE METABOLIC PANEL
ALT: 12 U/L (ref 0–44)
AST: 27 U/L (ref 15–41)
Albumin: 2.7 g/dL — ABNORMAL LOW (ref 3.5–5.0)
Alkaline Phosphatase: 169 U/L — ABNORMAL HIGH (ref 38–126)
Anion gap: 14 (ref 5–15)
BUN: 24 mg/dL — ABNORMAL HIGH (ref 8–23)
CO2: 28 mmol/L (ref 22–32)
Calcium: 9 mg/dL (ref 8.9–10.3)
Chloride: 96 mmol/L — ABNORMAL LOW (ref 98–111)
Creatinine, Ser: 5.56 mg/dL — ABNORMAL HIGH (ref 0.61–1.24)
GFR, Estimated: 10 mL/min — ABNORMAL LOW (ref >60)
Glucose, Bld: 190 mg/dL — ABNORMAL HIGH (ref 70–99)
Potassium: 4.3 mmol/L (ref 3.5–5.1)
Sodium: 138 mmol/L (ref 135–145)
Total Bilirubin: 0.5 mg/dL (ref 0.3–1.2)
Total Protein: 7.3 g/dL (ref 6.5–8.1)

## 2021-04-22 LAB — RESP PANEL BY RT-PCR (FLU A&B, COVID) ARPGX2
Influenza A by PCR: NEGATIVE
Influenza B by PCR: NEGATIVE
SARS Coronavirus 2 by RT PCR: NEGATIVE

## 2021-04-22 LAB — LIPASE, BLOOD: Lipase: 52 U/L — ABNORMAL HIGH (ref 11–51)

## 2021-04-22 MED ORDER — CLOPIDOGREL BISULFATE 75 MG PO TABS
75.0000 mg | ORAL_TABLET | Freq: Every day | ORAL | Status: DC
  Administered 2021-04-23 – 2021-04-25 (×3): 75 mg via ORAL
  Filled 2021-04-22 (×3): qty 1

## 2021-04-22 MED ORDER — SODIUM CHLORIDE 0.9 % IV SOLN
3.0000 g | Freq: Once | INTRAVENOUS | Status: DC
  Filled 2021-04-22: qty 8

## 2021-04-22 MED ORDER — SODIUM CHLORIDE 0.9 % IV SOLN
3.0000 g | INTRAVENOUS | Status: DC
  Administered 2021-04-22 – 2021-04-24 (×3): 3 g via INTRAVENOUS
  Filled 2021-04-22 (×4): qty 8

## 2021-04-22 MED ORDER — SODIUM CHLORIDE 0.9% FLUSH
3.0000 mL | Freq: Two times a day (BID) | INTRAVENOUS | Status: DC
  Administered 2021-04-23 – 2021-04-25 (×4): 3 mL via INTRAVENOUS

## 2021-04-22 MED ORDER — ACETAMINOPHEN 650 MG RE SUPP: 650.0000 mg | Freq: Four times a day (QID) | RECTAL | Status: DC | PRN

## 2021-04-22 MED ORDER — ATORVASTATIN CALCIUM 10 MG PO TABS
10.0000 mg | ORAL_TABLET | Freq: Every day | ORAL | Status: DC
  Administered 2021-04-23 – 2021-04-25 (×3): 10 mg via ORAL
  Filled 2021-04-22 (×3): qty 1

## 2021-04-22 MED ORDER — PANTOPRAZOLE SODIUM 40 MG PO TBEC
40.0000 mg | DELAYED_RELEASE_TABLET | Freq: Two times a day (BID) | ORAL | Status: DC
  Administered 2021-04-23 – 2021-04-25 (×5): 40 mg via ORAL
  Filled 2021-04-22 (×5): qty 1

## 2021-04-22 MED ORDER — NYSTATIN 100000 UNIT/ML MT SUSP
5.0000 mL | Freq: Four times a day (QID) | OROMUCOSAL | Status: DC
  Administered 2021-04-23 – 2021-04-25 (×6): 500000 [IU] via ORAL
  Filled 2021-04-22 (×8): qty 5

## 2021-04-22 MED ORDER — CHLORPROMAZINE HCL 10 MG PO TABS
10.0000 mg | ORAL_TABLET | Freq: Three times a day (TID) | ORAL | Status: DC | PRN
  Filled 2021-04-22: qty 1

## 2021-04-22 MED ORDER — PILOCARPINE HCL 4 % OP SOLN
1.0000 [drp] | Freq: Four times a day (QID) | OPHTHALMIC | Status: DC
  Administered 2021-04-23 – 2021-04-25 (×10): 1 [drp] via OPHTHALMIC
  Filled 2021-04-22: qty 15

## 2021-04-22 MED ORDER — METOCLOPRAMIDE HCL 5 MG/ML IJ SOLN
10.0000 mg | Freq: Three times a day (TID) | INTRAMUSCULAR | Status: DC
  Administered 2021-04-23 – 2021-04-24 (×5): 10 mg via INTRAVENOUS
  Filled 2021-04-22 (×5): qty 2

## 2021-04-22 MED ORDER — HEPARIN SODIUM (PORCINE) 5000 UNIT/ML IJ SOLN
5000.0000 [IU] | Freq: Three times a day (TID) | INTRAMUSCULAR | Status: DC
  Administered 2021-04-23 – 2021-04-25 (×8): 5000 [IU] via SUBCUTANEOUS
  Filled 2021-04-22 (×8): qty 1

## 2021-04-22 MED ORDER — FERRIC CITRATE 1 GM 210 MG(FE) PO TABS
420.0000 mg | ORAL_TABLET | Freq: Three times a day (TID) | ORAL | Status: DC
  Administered 2021-04-23 – 2021-04-24 (×4): 420 mg via ORAL
  Filled 2021-04-22 (×6): qty 2

## 2021-04-22 MED ORDER — LATANOPROST 0.005 % OP SOLN
1.0000 [drp] | Freq: Every day | OPHTHALMIC | Status: DC
  Administered 2021-04-23 – 2021-04-24 (×3): 1 [drp] via OPHTHALMIC
  Filled 2021-04-22: qty 2.5

## 2021-04-22 MED ORDER — HYDROXYZINE HCL 25 MG PO TABS
25.0000 mg | ORAL_TABLET | Freq: Three times a day (TID) | ORAL | Status: DC | PRN
  Administered 2021-04-24 – 2021-04-25 (×3): 25 mg via ORAL
  Filled 2021-04-22 (×3): qty 1

## 2021-04-22 MED ORDER — SEVELAMER CARBONATE 800 MG PO TABS
1600.0000 mg | ORAL_TABLET | Freq: Three times a day (TID) | ORAL | Status: DC
  Administered 2021-04-23 – 2021-04-24 (×4): 1600 mg via ORAL
  Filled 2021-04-22 (×5): qty 2

## 2021-04-22 MED ORDER — ONDANSETRON HCL 4 MG/2ML IJ SOLN
4.0000 mg | Freq: Once | INTRAMUSCULAR | Status: AC
  Administered 2021-04-22: 4 mg via INTRAVENOUS
  Filled 2021-04-22: qty 2

## 2021-04-22 MED ORDER — BRIMONIDINE TARTRATE 0.15 % OP SOLN
1.0000 [drp] | Freq: Three times a day (TID) | OPHTHALMIC | Status: DC
  Administered 2021-04-23 – 2021-04-25 (×8): 1 [drp] via OPHTHALMIC
  Filled 2021-04-22: qty 5

## 2021-04-22 MED ORDER — CINACALCET HCL 30 MG PO TABS
60.0000 mg | ORAL_TABLET | ORAL | Status: DC
  Administered 2021-04-24: 60 mg via ORAL
  Filled 2021-04-22 (×2): qty 2

## 2021-04-22 MED ORDER — ACETAMINOPHEN 325 MG PO TABS
650.0000 mg | ORAL_TABLET | Freq: Four times a day (QID) | ORAL | Status: DC | PRN
  Administered 2021-04-23 (×2): 650 mg via ORAL
  Filled 2021-04-22 (×2): qty 2

## 2021-04-22 MED ORDER — DORZOLAMIDE HCL-TIMOLOL MAL 2-0.5 % OP SOLN
1.0000 [drp] | Freq: Two times a day (BID) | OPHTHALMIC | Status: DC
  Administered 2021-04-23 – 2021-04-25 (×6): 1 [drp] via OPHTHALMIC
  Filled 2021-04-22: qty 10

## 2021-04-22 MED ORDER — CARBIDOPA-LEVODOPA 25-100 MG PO TABS
1.0000 | ORAL_TABLET | Freq: Three times a day (TID) | ORAL | Status: DC
  Administered 2021-04-23 – 2021-04-25 (×7): 1 via ORAL
  Filled 2021-04-22 (×9): qty 1

## 2021-04-22 MED ORDER — ARTIFICIAL TEARS OPHTHALMIC OINT: 1.0000 "application " | TOPICAL_OINTMENT | Freq: Two times a day (BID) | OPHTHALMIC | Status: DC | PRN

## 2021-04-22 MED ORDER — GABAPENTIN 100 MG PO CAPS
100.0000 mg | ORAL_CAPSULE | Freq: Every day | ORAL | Status: DC
  Administered 2021-04-23 – 2021-04-25 (×3): 100 mg via ORAL
  Filled 2021-04-22 (×3): qty 1

## 2021-04-22 MED ORDER — MELATONIN 5 MG PO TABS
5.0000 mg | ORAL_TABLET | Freq: Every day | ORAL | Status: DC
  Administered 2021-04-23 – 2021-04-24 (×2): 5 mg via ORAL
  Filled 2021-04-22 (×2): qty 1

## 2021-04-22 MED ORDER — ONDANSETRON HCL 4 MG PO TABS: 4.0000 mg | ORAL_TABLET | Freq: Four times a day (QID) | ORAL | Status: DC | PRN

## 2021-04-22 MED ORDER — ONDANSETRON HCL 4 MG/2ML IJ SOLN: 4.0000 mg | Freq: Four times a day (QID) | INTRAMUSCULAR | Status: DC | PRN

## 2021-04-22 NOTE — Assessment & Plan Note (Addendum)
Patient reports persistent nausea and vomiting since initial acute cholecystitis episode, worse recently.  Still with active nausea, vomiting, and frequent hiccups.  EGD 03/25/2021 showed esophageal candidiasis with chronic gastritis and multiple duodenal polyps.  He was treated with Diflucan and completed course.  He was seen by speech therapy on 2/19 with no concerns and started on a mechanical soft diet with thin liquids.  Continue Zofran/Reglan as needed.

## 2021-04-22 NOTE — ED Notes (Signed)
To ct

## 2021-04-22 NOTE — ED Notes (Signed)
Bp 160/88 p95

## 2021-04-22 NOTE — Hospital Course (Signed)
Elijah White is a 81 y.o. 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

## 2021-04-22 NOTE — Assessment & Plan Note (Addendum)
Seen on CT imaging without evidence of obstruction.  Question of bladder outlet obstruction although patient reportedly oliguric at baseline.

## 2021-04-22 NOTE — Assessment & Plan Note (Signed)
Remains in sinus rhythm with controlled rate on admission.  Not on full dose anticoagulation due to history of anemia and concern for GI bleed in the past.  Not currently on rate or rhythm controlling medications.

## 2021-04-22 NOTE — Assessment & Plan Note (Addendum)
Hypotensive on last admission, now off antihypertensives.  Has been on midodrine.  BP currently stable to mildly elevated.  Continue midodrine, now as needed as patient's blood pressure have been normotensive off of midodrine while inpatient.

## 2021-04-22 NOTE — ED Notes (Signed)
The pt arrived by [facility].  The pt has been vomiting for 3 days.  He was in his bed and he dropped his vomit bag and when he bent to retreive it he slid off the bed and onto his head he struck the corner of a table  on arrival he is still vomiting.  Pu[ses presence lt bk moves all 4 extremities  pt alert and oriented

## 2021-04-22 NOTE — Assessment & Plan Note (Addendum)
Continue Plavix 75mg  PO daily.

## 2021-04-22 NOTE — ED Triage Notes (Signed)
The pt arrived b y [facility] from  [facility] he has been vomiting for 3-4 days he was vomiting today and he dropped his vomit bag and he tehn lost his balance and rolled over into the floor striking his head on the corner of the table.  Alert on arrival  constant vomiting indigested food  suction connector not he,lping

## 2021-04-22 NOTE — H&P (Signed)
History and Physical    Elijah White:[id number] DOB: 12/23/1940 DOA: 04/22/2021  PCP: Maryella Shivers, MD  Patient coming from: [facility] and rehab  I have personally briefly reviewed patient's old medical records in [place]  Chief Complaint: Intractable nausea and vomiting, fall  HPI: Elijah White is a 81 y.o. male with medical history significant for ESRD on MWF HD, PAF not on anticoagulation, severe aortic stenosis s/p TAVR (01/19/2020), PVD s/p left BKA, anemia of chronic kidney disease, Parkinson's disease, legal blindness, and chronic cholecystitis s/p percutaneous cholecystostomy 03/28/2021 who presents to the ED for evaluation of intractable nausea and vomiting and fall at his SNF.  His niece is at bedside and provides additional history.  Patient with several recent hospitalizations: 1/11-1/22 hospitalized for sepsis due to acute cholecystitis.  Underwent cholecystostomy drain placement on 1/12 and removed on 1/17.  He was stabilized on antibiotics and discharged to SNF without drain in place  1/23-2/1 readmitted with severe abdominal pain.  Cholecystostomy tube placed on 1/24 and patient discharged back to SNF.  2/4-2/9 readmitted with hypotension and RUQ pain.  Hypotension felt multifactorial due to hypovolemia, smoldering sepsis from ongoing cholecystitis, fluid shifts with hemodialysis, and autonomic dysfunction associated with Parkinson's disease.  BP stabilized with midodrine.  Cholecystostomy drain remain in place and completed antibiotics on 2/7.  Patient states that he has been having frequent nausea and vomiting since his initial gallbladder issues.  He states that he can normally tolerate breakfast but otherwise is unable to tolerate other meals.  He says today he was in bed leaning over to reach his emesis basin when he fell over and hit his head on the ground.  He developed a contusion to his right upper forehead but did not lose consciousness  and denied any other significant injury.  Patient reports some intermittent aspiration events during his emesis episodes.  He has had some shortness of breath and cough.  He has not had any chest pain.  He has intermittent abdominal pain.  He states that he is still makes small amount of urine.  He has had some loose stools.  He is following with palliative care as an outpatient.  His niece states they are having another palliative care meeting with additional family including patient's son who lives in [place] next week.  ED Course   Labs/Imaging on admission: I have personally reviewed following labs and imaging studies.  Initial vitals showed BP 166/82, pulse 86, RR 13, temp 98.4 F, SPO2 not documented.  Labs show WBC 20.1, hemoglobin 10.5, platelets 382,000, sodium 138, potassium 4.3, bicarb 28, BUN 24, creatinine 5.56, serum glucose 190, AST 27, ALT 12, alk phos 169, total bilirubin 0.5, lipase 52.  Portable chest x-ray shows mild to moderate severity right basilar atelectasis and/or infiltrate with small stable right pleural effusion.  CT head and cervical spine without contrast are negative for evidence of acute intracranial abnormality or evidence of acute injury to the cervical spine.  Atrophy, chronic small vessel white matter ischemic changes and remote infarcts and right frontal scalp soft tissue swelling without fracture noted.  CT chest/abdomen/pelvis without contrast is negative for evidence of acute injury to the chest, abdomen, pelvis.  Mild apparent circumferential rectal wall thickening noted.  Moderate bilateral hydroureteronephrosis to the bladder without obstructing cause identified.  Small to moderate right pleural effusion and right lower lung atelectasis seen.  Cholelithiasis and cholecystostomy tube within the gallbladder again noted.  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  Patient was given IV Zofran and Unasyn.  The hospitalist service  was consulted to admit for further evaluation and management.  Review of Systems:  All systems reviewed and are negative except as documented in history of present illness above.   Past Medical History:  Diagnosis Date   Anemia    low iron   Aortic stenosis    s/p TAVR 01/19/20   Arthritis    Cancer [facility])    prostate, s/p I-125 seed implant 05/18/05   Colon polyps ~ 1993 and 2003   Dr [name], [facility].  08/2001 colonoscopy: tubular adenoma at cecum.     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   Elevated cholesterol with high triglycerides    ESRD (end stage renal disease) (HCC)    [facility]   GERD (gastroesophageal reflux disease)    Glaucoma    Hypertension    Legally blind in left eye, as defined in [place]    has pinpoint vision in right eye   PAD (peripheral artery disease) ([place])    RBBB     Past Surgical History:  Procedure Laterality Date   ABDOMINAL AORTOGRAM W/LOWER EXTREMITY N/A 07/25/2020   Procedure: ABDOMINAL AORTOGRAM W/LOWER EXTREMITY;  Surgeon: Marty Heck, MD;  Location: [place] [facility];  Service: Cardiovascular;  Laterality: N/A;   AMPUTATION Left 08/12/2020   Procedure: LEFT BELOW KNEE AMPUTATION;  Surgeon: Marty Heck, MD;  Location: [place];  Service: Vascular;  Laterality: Left;   AV FISTULA PLACEMENT Left 03/20/2018   Procedure: ARTERIOVENOUS (AV) FISTULA CREATION ARM;  Surgeon: [name], MD;  Location: [place];  Service: Vascular;  Laterality: Left;   [place] Left 05/21/2018   Procedure: LEFT BASILIC VEIN FISTULA SECOND STAGE;  Surgeon: [name], MD;  Location: [place];  Service: Vascular;  Laterality: Left;   BIOPSY  03/25/2021   Procedure: BIOPSY;  Surgeon: Otis Brace, MD;  Location: [place] [facility];  Service: Gastroenterology;;   COLONOSCOPY     COLONOSCOPY WITH PROPOFOL N/A 11/30/2019   Procedure: COLONOSCOPY WITH PROPOFOL;  Surgeon:  Ronnette Juniper, MD;  Location: [place];  Service: Gastroenterology;  Laterality: N/A;   ESOPHAGOGASTRODUODENOSCOPY (EGD) WITH PROPOFOL N/A 03/25/2021   Procedure: ESOPHAGOGASTRODUODENOSCOPY (EGD) WITH PROPOFOL;  Surgeon: Otis Brace, MD;  Location: [place];  Service: Gastroenterology;  Laterality: N/A;   GLAUCOMA SURGERY  2019   multiple surgeries   HEMOSTASIS CLIP PLACEMENT  11/30/2019   Procedure: HEMOSTASIS CLIP PLACEMENT;  Surgeon: Ronnette Juniper, MD;  Location: [place];  Service: Gastroenterology;;   IR CHOLANGIOGRAM EXISTING TUBE  03/21/2021   IR PERC CHOLECYSTOSTOMY  03/16/2021   IR PERC CHOLECYSTOSTOMY  03/28/2021   IR REMOVAL BILIARY DRAIN  03/21/2021   KNEE ARTHROSCOPY     MULTIPLE EXTRACTIONS WITH ALVEOLOPLASTY N/A 12/30/2019   Procedure: MULTIPLE EXTRACTION WITH ALVEOLOPLASTY;  Surgeon: Charlaine Dalton, DMD;  Location: [place];  Service: Dentistry;  Laterality: N/A;   PERIPHERAL VASCULAR INTERVENTION Left 07/25/2020   Procedure: PERIPHERAL VASCULAR INTERVENTION;  Surgeon: Marty Heck, MD;  Location: [place] [facility];  Service: Cardiovascular;  Laterality: Left;  superficial femoral   POLYPECTOMY  11/30/2019   Procedure: POLYPECTOMY;  Surgeon: Ronnette Juniper, MD;  Location: [place];  Service: Gastroenterology;;   PROSTATE SURGERY     RIGHT/LEFT HEART CATH AND CORONARY ANGIOGRAPHY N/A 12/03/2019   Procedure: RIGHT/LEFT HEART CATH AND CORONARY ANGIOGRAPHY;  Surgeon: Burnell Blanks, MD;  Location: [place] [facility];  Service: Cardiovascular;  Laterality: N/A;   SUBMUCOSAL TATTOO INJECTION  11/30/2019   Procedure: SUBMUCOSAL TATTOO INJECTION;  Surgeon: Ronnette Juniper, MD;  Location: [place];  Service: Gastroenterology;;   TEE WITHOUT CARDIOVERSION N/A 01/19/2020   Procedure: TRANSESOPHAGEAL ECHOCARDIOGRAM (TEE);  Surgeon: Burnell Blanks, MD;  Location: [place] [facility];  Service: Open Heart Surgery;  Laterality: N/A;   TRANSCATHETER AORTIC  VALVE REPLACEMENT, TRANSFEMORAL Left 01/19/2020   Procedure: TRANSCATHETER AORTIC VALVE REPLACEMENT, LEFT TRANSFEMORAL;  Surgeon: Burnell Blanks, MD;  Location: [place] [facility];  Service: Open Heart Surgery;  Laterality: Left;    Social History:  reports that he quit smoking about 3 years ago. His smoking use included cigarettes. He smoked an average of .5 packs per day. He has never used smokeless tobacco. He reports that he does not drink alcohol and does not use drugs.  No Known Allergies  Family History  Problem Relation Age of Onset   CAD Mother      Prior to Admission medications   Medication Sig Start Date End Date Taking? Authorizing Provider  acetaminophen (TYLENOL) 500 MG tablet Take 2 tablets (1,000 mg total) by mouth every 8 (eight) hours as needed for moderate pain or headache. 04/13/21  Yes Ghimire, Henreitta Leber, MD  atorvastatin (LIPITOR) 10 MG tablet Take 1 tablet (10 mg total) by mouth in the morning. 08/16/20  Yes Rhyne, Samantha J, PA-C  B Complex-C-Folic Acid (DIALYVITE 921) 0.8 MG TABS Take 1 tablet by mouth in the morning. 05/08/19  Yes [provider]  baclofen (LIORESAL) 10 MG tablet Take 0.5 tablets (5 mg total) by mouth 3 (three) times daily as needed (hiccups). 04/13/21 04/13/22 Yes Ghimire, Henreitta Leber, MD  brimonidine (ALPHAGAN) 0.15 % ophthalmic solution Place 1 drop into the right eye 3 (three) times daily.   Yes [provider]  carbidopa-levodopa (SINEMET) 25-100 MG tablet Take 1 tablet by mouth 3 (three) times daily. Patient taking differently: Take 1 tablet by mouth 3 (three) times daily. 10am, 4pm, 10pm 10/19/20 05/13/21 Yes Curatolo, Adam, DO  cinacalcet (SENSIPAR) 30 MG tablet Take 2 tablets (60 mg total) by mouth every Monday, Wednesday, and Friday with hemodialysis. 04/05/21  Yes Bonnielee Haff, MD  clopidogrel (PLAVIX) 75 MG tablet Take 1 tablet (75 mg total) by mouth daily with breakfast. 07/25/20  Yes Marty Heck, MD   dorzolamide-timolol (COSOPT) 22.3-6.8 MG/ML ophthalmic solution Place 1 drop into the right eye 2 (two) times daily. 06/25/17  Yes [provider]  ferric citrate (AURYXIA) 1 GM 210 MG(Fe) tablet Take 420 mg by mouth 3 (three) times daily with meals.   Yes [provider]  fluconazole (DIFLUCAN) 100 MG tablet Take 100 mg by mouth See admin instructions. Qd x 4 days   Yes [provider]  gabapentin (NEURONTIN) 100 MG capsule Take 1 capsule (100 mg total) by mouth daily. 10/19/20 05/13/21 Yes Curatolo, Adam, DO  hypromellose (GENTEAL SEVERE) 0.3 % GEL ophthalmic ointment Place 1 application into the left eye every 12 (twelve) hours as needed for dry eyes.   Yes [provider]  melatonin 5 MG TABS Take 5 mg by mouth at bedtime.   Yes [provider]  midodrine (PROAMATINE) 10 MG tablet Take 1 tablet (10 mg total) by mouth 3 (three) times daily with meals. 04/13/21  Yes Ghimire, Henreitta Leber, MD  Netarsudil-Latanoprost (ROCKLATAN) 0.02-0.005 % SOLN Place 1 drop into the  right eye at bedtime.   Yes [provider]  nystatin (MYCOSTATIN) 100000 UNIT/ML suspension Take 5 mLs (500,000 Units total) by mouth 4 (four) times daily. 03/26/21  Yes Florencia Reasons, MD  [facility] VERIO test strip USE AS DIRECTED TO TEST FOUR TIMES A DAY 07/31/18  Yes [name], MD  pantoprazole (PROTONIX) 40 MG tablet Take 1 tablet (40 mg total) by mouth 2 (two) times daily. 04/13/21  Yes Ghimire, Henreitta Leber, MD  pilocarpine (PILOCAR) 4 % ophthalmic solution Place 1 drop into the right eye 4 (four) times daily.    Yes [provider]  Pollen Extracts (PROSTAT PO) Take 30 mLs by mouth in the morning and at bedtime.   Yes [provider]  polyethylene glycol (MIRALAX / GLYCOLAX) 17 g packet Take 17 g by mouth daily. Patient taking differently: Take 17 g by mouth every morning. Mix in 4 oz water and drink 03/26/21  Yes Florencia Reasons, MD  senna-docusate (SENOKOT-S) 8.6-50 MG tablet  Take 1 tablet by mouth at bedtime. 03/26/21  Yes Florencia Reasons, MD  sevelamer carbonate (RENVELA) 800 MG tablet Take 2 tablets (1,600 mg total) by mouth 3 (three) times daily with meals. 03/26/21  Yes Florencia Reasons, MD  blood glucose meter kit and supplies KIT Dispense based on patient and insurance preference. Use up to four times daily as directed. (FOR ICD-9 250.00, 250.01). For QAC - HS accuchecks. 03/30/18   Thurnell Lose, MD  docusate sodium (COLACE) 100 MG capsule Take 100 mg by mouth 2 (two) times daily. Patient not taking: Reported on 04/22/2021    [provider]  hydrOXYzine (ATARAX) 25 MG tablet Take 25 mg by mouth every 8 (eight) hours as needed for itching. 04/07/21   [provider]  Nutritional Supplements (,FEEDING SUPPLEMENT, PROSOURCE PLUS) liquid Take 30 mLs by mouth 2 (two) times daily between meals. 04/13/21   Jonetta Osgood, MD    Physical Exam: Vitals:   04/22/21 2045 04/22/21 2115 04/22/21 2130 04/22/21 2145  BP: (!) 133/56 139/61 (!) 144/81 136/68  Resp: $Remo'15 13 19 19  'CfWjf$ Temp:      Weight:      Height:       Constitutional: Chronically ill-appearing elderly man resting in bed upper body rotated towards the right, NAD, calm, comfortable Head: Contusion right upper forehead without open wound Eyes: PERRL, lids and conjunctivae normal ENMT: Mucous membranes are moist. Posterior pharynx clear of any exudate or lesions.poor dentition.  Neck: normal, supple, no masses. Respiratory: Diminished breath sounds right lower lung field.. Normal respiratory effort. No accessory muscle use.  Cardiovascular: Regular rate and rhythm, no murmurs / rubs / gallops. No extremity edema.  LUE aVF. Abdomen: Mild lower abdominal tenderness, no masses palpated.  Bowel sounds positive.  Cholecystostomy tube in place RUQ with scant dark fluid in bag. Musculoskeletal: S/p left BKA.  No clubbing / cyanosis. No joint deformity upper and lower extremities.  Skin: Warm, dry Neurologic: CN  2-12 grossly intact. Sensation intact, moving all extremities Psychiatric: Normal judgment and insight. Alert and oriented x 3. Normal mood.   EKG: Personally reviewed. Normal sinus rhythm, rate 82, incomplete RBBB, motion artifact present.  PAC not present when compared to prior.  Assessment/Plan Principal Problem:   Intractable nausea and vomiting Active Problems:   Aspiration pneumonia (HCC)   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 03/28/2021   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HCC)   Paroxysmal atrial fibrillation (HCC)   Bilateral hydroureteronephrosis   Type  2 diabetes mellitus with renal manifestations (A1c 5.3 on 2/3)   GERD (gastroesophageal reflux disease)/esophageal candidiasis    HLD (hyperlipidemia)   Blindness/glaucoma    Anemia in chronic kidney disease   Chronic hypotension   Parkinson's disease ([place])   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   At risk for delirium   Elijah White is a 82 y.o. 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  Assessment and Plan: * Intractable nausea and vomiting- (present on admission) Patient reports persistent nausea and vomiting since initial acute cholecystitis episode, worse recently.  Still with active nausea, vomiting, and frequent hiccups.  EGD 03/25/2021 showed esophageal candidiasis with chronic gastritis and multiple duodenal polyps.  He was treated with Diflucan at the time. -Keep n.p.o. except for sips with meds as tolerated -Placed on scheduled IV Reglan with Zofran as needed -Thorazine as needed for hiccoughs -SLP eval  Aspiration pneumonia ([place])-  (present on admission) Patient reports recent aspiration event concern for aspiration pneumonia versus pneumonitis.  He has moderate right pleural effusion seen on CT imaging.  Given elevated white count compared to recent baseline he has been started on IV Unasyn.  Follow SLP eval.  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  CT imaging shows cholelithiasis again without evidence of biliary dilatation and unremarkable appearing pancreas.  WBC elevated, unclear if related to ongoing cholecystitis issues versus aspiration as above.  Will be started on IV Unasyn.  Continue cystostomy care.  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[place]) Reports last HD 2/17.  No indication for an emergent HD at time of admission.  Will need nephrology consult for routine HD needs if remains in hospital for next dialysis session.  Paroxysmal atrial fibrillation ([place])- (present on admission) Remains in sinus rhythm with controlled rate on admission.  Not on full dose anticoagulation due to history of anemia and concern for GI bleed in the past.  Not currently on rate or rhythm controlling medications.  Bilateral hydroureteronephrosis- (present on admission) Seen on CT imaging without evidence of obstruction.  Question of bladder outlet obstruction although patient reportedly oliguric at baseline.  Bladder scan as needed to assess for any acute retention.  At risk for delirium Developed hospital associated delirium on last admission.  Place on delirium precautions.  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- (present on admission) Continue Plavix.  Parkinson's disease ([place])- (present on admission) Continue Sinemet.  Chronic hypotension- (present on admission) Hypotensive on last admission, now off antihypertensives.  Has been on midodrine.  BP currently stable to mildly elevated.  Resume midodrine if  needed.  Anemia in chronic kidney disease- (present on admission) Hemoglobin stable without obvious bleeding.  Continue to monitor.  Blindness/glaucoma  Continue eyedrops.  Legally blind in the left eye.  HLD (hyperlipidemia)- (present on admission) Continue atorvastatin.  GERD (gastroesophageal reflux disease)/esophageal candidiasis - (present on admission) Continue Protonix.  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  Completed therapy with Diflucan.  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  Continue to monitor.  DVT prophylaxis: heparin injection 5,000 Units Start: 04/22/21 2300 Code Status: DNR, confirmed  on admission Family Communication: Discussed with patient's daughter at bedside Disposition Plan: From SNF and likely discharge to SNF pending clinical progress Consults called: None Severity of Illness: The appropriate patient status for this patient is INPATIENT. Inpatient status is judged to be reasonable and necessary in order to provide the required intensity of service to ensure the patient's safety. The patient's presenting symptoms, physical exam findings, and initial radiographic and laboratory data in the context of their chronic comorbidities is felt to place them at high risk for further clinical deterioration. Furthermore, it is not anticipated that the patient will be medically stable for discharge from the hospital within 2 midnights of admission.   * I certify that at the point of admission it is my clinical judgment that the patient will require inpatient hospital care spanning beyond 2 midnights from the point of admission due to high intensity of service, high risk for further deterioration and high frequency of surveillance required.Zada Finders MD [facility]  If 7PM-7AM, please contact night-coverage [email or web address]  04/22/2021, 11:27 PM

## 2021-04-22 NOTE — Assessment & Plan Note (Addendum)
Hemoglobin 7.9, stable no signs of obvious bleeding.

## 2021-04-22 NOTE — ED Notes (Signed)
The pts  niece is at  the bedside

## 2021-04-22 NOTE — ED Notes (Signed)
Ptg is dialysis fistula lt upper arm dialyzed yesterday  he ate tuna earlier today  it is being vomited back  big chunks of tuna  suction tubing finding it hard to suction  the food  and it is mainly coming out of his mouth onto his chest and hands  cleaned up partially  pt taken to  c-t

## 2021-04-22 NOTE — ED Provider Notes (Signed)
Emergency Department Provider Note   I have reviewed the triage vital signs and the nursing notes.   HISTORY  Chief Complaint Trauma   HPI Elijah White is a 81 y.o. male with PMH reviwed below presents to the ED after a fall from the bed. Patient is coming from a local SNF after recent discharge after recovering from acute cholecystitis (with perc chole drain in place), recent PNA, and baseline hypotension on HD.  EMS reported that the patient has been vomiting for several days and was reaching to get an emesis basin at his bedside when he inadvertently rolled out of bed.  He did strike his head on the floor according to staff.  No loss of consciousness.  Patient reports a mild headache but no other pain.  Tells me he is not having abdominal pain but has been having vomiting for several days.  No fevers.  He is not feeling short of breath.  No pain in the arms or legs.   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  08/2001 colonoscopy: tubular adenoma at cecum.     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 ENT: No sore throat. Cardiovascular: Denies chest pain. Respiratory: Denies shortness of breath. Gastrointestinal: No abdominal pain. Positive nausea and vomiting.  No diarrhea.  No constipation. Genitourinary: Negative for dysuria. Musculoskeletal: Negative for  back pain. Skin: Negative for rash. Neurological: Negative for focal weakness or numbness. Positive HA.    ____________________________________________   PHYSICAL EXAM:  VITAL SIGNS: Temp: 98.2 F Resp: 14 SpO2: 99% BP: 110/56  Constitutional: Alert and oriented. Intermittent vomiting but no acute distress.  Eyes: Conjunctivae are normal. Baseline blindness in the left eye.  Head: Atraumatic. Nose: No congestion/rhinnorhea. Mouth/Throat: Mucous membranes are moist.  Neck: No stridor. No midline cervical spine tenderness.  Cardiovascular: Normal rate, regular rhythm. Good peripheral circulation. Grossly normal heart sounds.   Respiratory: Normal respiratory effort.  No retractions. Lungs CTAB. Gastrointestinal: Soft and nontender. No distention.  Musculoskeletal: Patient with BKA on the left. Normal ROM of the bilateral hips.  Neurologic:  Normal speech and language. No gross focal neurologic deficits are appreciated.  Skin:  Skin is warm, dry and intact. No rash noted.   ____________________________________________   LABS (all labs ordered are listed, but only abnormal results are displayed)  Labs Reviewed  COMPREHENSIVE METABOLIC PANEL - Abnormal; Notable for the following components:      Result Value   Chloride 96 (*)    Glucose, Bld 190 (*)    BUN 24 (*)    Creatinine, Ser 5.56 (*)    Albumin 2.7 (*)    Alkaline Phosphatase 169 (*)    GFR, Estimated 10 (*)  All other components within normal limits  LIPASE, BLOOD - Abnormal; Notable for the following components:   Lipase 52 (*)    All other components within normal limits  CBC WITH DIFFERENTIAL/PLATELET - Abnormal; Notable for the following components:   WBC 20.1 (*)    RBC 3.06 (*)    Hemoglobin 10.5 (*)    HCT 33.1 (*)    MCV 108.2 (*)    MCH 34.3 (*)    RDW 16.1 (*)    Neutro Abs 16.5 (*)    Eosinophils Absolute 1.8 (*)    All other components within normal limits  CBC - Abnormal; Notable for the  following components:   WBC 14.9 (*)    RBC 2.45 (*)    Hemoglobin 8.4 (*)    HCT 25.9 (*)    MCV 105.7 (*)    MCH 34.3 (*)    RDW 15.9 (*)    nRBC 0.3 (*)    All other components within normal limits  BASIC METABOLIC PANEL - Abnormal; Notable for the following components:   Glucose, Bld 152 (*)    BUN 26 (*)    Creatinine, Ser 5.78 (*)    Calcium 8.6 (*)    GFR, Estimated 9 (*)    All other components within normal limits  GLUCOSE, CAPILLARY - Abnormal; Notable for the following components:   Glucose-Capillary 124 (*)    All other components within normal limits  GLUCOSE, CAPILLARY - Abnormal; Notable for the following components:   Glucose-Capillary 152 (*)    All other components within normal limits  GLUCOSE, CAPILLARY - Abnormal; Notable for the following components:   Glucose-Capillary 213 (*)    All other components within normal limits  CBC - Abnormal; Notable for the following components:   WBC 16.0 (*)    RBC 2.56 (*)    Hemoglobin 8.5 (*)    HCT 27.1 (*)    MCV 105.9 (*)    RDW 15.9 (*)    All other components within normal limits  RENAL FUNCTION PANEL - Abnormal; Notable for the following components:   Glucose, Bld 151 (*)    BUN 30 (*)    Creatinine, Ser 6.48 (*)    Calcium 8.6 (*)    Phosphorus 1.9 (*)    Albumin 2.0 (*)    GFR, Estimated 8 (*)    All other components within normal limits  GLUCOSE, CAPILLARY - Abnormal; Notable for the following components:   Glucose-Capillary 148 (*)    All other components within normal limits  GLUCOSE, CAPILLARY - Abnormal; Notable for the following components:   Glucose-Capillary 116 (*)    All other components within normal limits  CBC - Abnormal; Notable for the following components:   WBC 11.5 (*)    RBC 2.39 (*)    Hemoglobin 7.9 (*)    HCT 25.3 (*)    MCV 105.9 (*)    RDW 15.7 (*)    All other components within normal limits  RENAL FUNCTION PANEL - Abnormal; Notable for the following components:    Creatinine, Ser 4.68 (*)    Calcium 8.2 (*)    Phosphorus 1.9 (*)    Albumin 2.0 (*)    GFR, Estimated 12 (*)    All other components within normal limits  GLUCOSE, CAPILLARY - Abnormal; Notable for the following components:   Glucose-Capillary 145 (*)    All other components within normal limits  GLUCOSE, CAPILLARY - Abnormal; Notable for the following components:  Glucose-Capillary 158 (*)    All other components within normal limits  GLUCOSE, CAPILLARY - Abnormal; Notable for the following components:   Glucose-Capillary 136 (*)    All other components within normal limits  RESP PANEL BY RT-PCR (FLU A&B, COVID) ARPGX2  RESP PANEL BY RT-PCR (FLU A&B, COVID) ARPGX2  GLUCOSE, CAPILLARY  GLUCOSE, CAPILLARY  GLUCOSE, CAPILLARY   ____________________________________________  EKG   EKG Interpretation  Date/Time:  Saturday April 22 2021 20:08:54 EST Ventricular Rate:  82 PR Interval:  135 QRS Duration: 119 QT Interval:  414 QTC Calculation: 484 R Axis:   77 Text Interpretation: Normal sinus rhythm Incomplete right bundle branch block Minimal ST elevation, lateral leads Artifact in lead(s) I III aVR aVL V1 V2 Confirmed by Nanda Quinton [phone]) on 04/22/2021 8:11:50 PM        ____________________________________________   PROCEDURES  Procedure(s) performed:   Procedures   ____________________________________________   INITIAL IMPRESSION / ASSESSMENT AND PLAN / ED COURSE  Pertinent labs & imaging results that were available during my care of the patient were reviewed by me and considered in my medical decision making (see chart for details).   This patient is Presenting for Evaluation of head injury, which does require a range of treatment options, and is a complaint that involves a high risk of morbidity and mortality.  The Differential Diagnoses  includes subdural hematoma, epidural hematoma, acute concussion, traumatic subarachnoid hemorrhage, cerebral  contusions, etc.   Critical Interventions- IV nausea medication   Medications  ondansetron (ZOFRAN) injection 4 mg (4 mg Intravenous Given 04/22/21 2147)  ondansetron (ZOFRAN) injection 4 mg (4 mg Intravenous Given 04/22/21 2015)    Reassessment after intervention:  nausea improved   I did obtain Additional Historical Information from EMS.  I decided to review pertinent External Data, and in summary patient discharged from the hospital on 04/13/21.   Clinical Laboratory Tests Ordered, included positive leukocytosis with mild anemia.  Lipase is slightly elevated to 52.  Creatinine elevated but no normal electrolytes.  Radiologic Tests Ordered, included CT head, c-spine, and chest/abdomen/pelvis. I independently interpreted the images and agree with radiology interpretation.   Cardiac Monitor Tracing which shows NSR.   Social Determinants of Health Risk patient is a former smoker.   Consult complete with Hospitalist.   Discussed patient's case with TRH to request admission. Patient and family (if present) updated with plan. Care transferred to [facility] service.  I reviewed all nursing notes, vitals, pertinent old records, EKGs, labs, imaging (as available).   Medical Decision Making: Summary:  Patient presents to the emergency department for evaluation after falling out of bed at his SNF.  No outward sign of head trauma.  Patient is awake and alert.  He is having emesis which by report has been ongoing for several days.  I do see a percutaneous cholecystostomy tube in place.  Abdomen is soft.  Plan for CT imaging of the head and neck.  Given his vomiting I have added on chest, abdomen, pelvis although from a trauma perspective seems fairly well-appearing.   Disposition: admit  ____________________________________________  FINAL CLINICAL IMPRESSION(S) / ED DIAGNOSES  Final diagnoses:  Intractable nausea and vomiting  Leukocytosis, unspecified type     NEW OUTPATIENT MEDICATIONS  STARTED DURING THIS VISIT:  Discharge Medication List as of 04/25/2021  8:45 PM     START taking these medications   Details  amoxicillin-clavulanate (AUGMENTIN) 500-125 MG tablet Take 1 tablet (500 mg total) by mouth daily at 6 (six) AM  for 8 days. Take following dialysis on HD days., Starting Tue 04/25/2021, Until Wed 05/03/2021, Print    metoCLOPramide (REGLAN) 10 MG tablet Take 1 tablet (10 mg total) by mouth every 6 (six) hours as needed for nausea or vomiting., Starting Tue 04/25/2021, No Print        Note:  This document was prepared using Dragon voice recognition software and may include unintentional dictation errors.  Nanda Quinton, MD, [facility] Emergency Medicine    Woodson Macha, [facility], MD 04/27/21 [phone]

## 2021-04-22 NOTE — Assessment & Plan Note (Addendum)
Continue Sinemet 25-100 PO TID.

## 2021-04-22 NOTE — ED Notes (Signed)
The pt is c/o a slight headache  he still continues to vomit  dr long has ordered another  zofran

## 2021-04-22 NOTE — Progress Notes (Signed)
°   04/22/21 1917  Clinical Encounter Type  Visited With Health care provider  Visit Type Initial;ED;Trauma    Chaplain responded to a trauma in the ED - level II, fall on blood thinners. Per EMS, patient came from a facility, and they think the facility called the pt's daughter. Patient also has his phone with him. Per RN, no needs at this time. Spiritual care services available as needed.   Jeri Lager, Chaplain

## 2021-04-22 NOTE — Assessment & Plan Note (Addendum)
Nephrology consulted to continue HD while inpatient.  Renvela discontinued due to low phosphorus on admission.  Repeat renal panel 1 week.

## 2021-04-22 NOTE — Assessment & Plan Note (Addendum)
Developed hospital associated delirium on last admission.  No concerns during this hospitalization, but continue delirium precautions.

## 2021-04-22 NOTE — Assessment & Plan Note (Addendum)
Patient reports recent aspiration event concern for aspiration pneumonia versus pneumonitis.  He has moderate right pleural effusion seen on CT imaging.  WBC count elevated 20.1 on admission.  Patient was started on IV Unasyn with improvement of WBC count to 11.1 at time of discharge.  We will continue Augmentin 500-125 mg p.o. every 24 hours, renally dosed to complete 10-day course.  Recommend antibiotic following dialysis on HD days.  Recommend repeat CBC in 1 week to ensure WBC count is normalized.

## 2021-04-22 NOTE — Progress Notes (Signed)
Orthopedic Tech Progress Note Patient Details:  Elijah White 1940/11/17 [id number]  Level 2 trauma  Patient ID: Elijah White, male   DOB: February 26, 1941, 81 y.o.   MRN: [id number]  Elijah White 04/22/2021, 7:18 PM

## 2021-04-22 NOTE — Assessment & Plan Note (Addendum)
Continue atorvastatin 10mg  PO daily.

## 2021-04-22 NOTE — Assessment & Plan Note (Addendum)
Legally blind in the left eye.  Continue home eyedrops.

## 2021-04-22 NOTE — Assessment & Plan Note (Addendum)
Diet controlled.  

## 2021-04-22 NOTE — Assessment & Plan Note (Addendum)
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  Completed therapy with Diflucan.Continue Protonix 40mg  PO BID

## 2021-04-22 NOTE — Assessment & Plan Note (Addendum)
Cholecystostomy drain remains in place with scant dark output.  CT imaging shows cholelithiasis again without evidence of biliary dilatation and unremarkable appearing pancreas.  WBC elevated, unclear if related to ongoing cholecystitis issues versus aspiration as above.  Was started on Unasyn for concerns of aspiration pneumonia as above, will continue Augmentin on discharge to complete 10-day course.  Continue cystostomy care and outpatient follow-up with IR drain clinic and general surgery.

## 2021-04-22 NOTE — ED Notes (Signed)
Trauma Response Nurse Documentation   Elijah White is a 81 y.White. male arriving to [facility] ED via EMS  On clopidogrel 75 mg daily. Trauma was activated as a Level 2 by ED charge RN based on the following trauma criteria Elderly patients > 65 with head trauma on anti-coagulation (excluding ASA). Trauma team at the bedside on patient arrival. Patient cleared for CT by Dr. Laverta Baltimore EDP. Patient to CT with team. GCS 15.  History   Past Medical History:  Diagnosis Date   Anemia    low iron   Aortic stenosis    s/p TAVR 01/19/20   Arthritis    Cancer [facility])    prostate, s/p I-125 seed implant 05/18/05   Colon polyps ~ 1993 and 2003   Dr [name], [facility].  08/2001 colonoscopy: tubular adenoma at cecum.     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   Elevated cholesterol with high triglycerides    ESRD (end stage renal disease) (HCC)    [facility]   GERD (gastroesophageal reflux disease)    Glaucoma    Hypertension    Legally blind in left eye, as defined in [place]    has pinpoint vision in right eye   PAD (peripheral artery disease) ([place])    RBBB      Past Surgical History:  Procedure Laterality Date   ABDOMINAL AORTOGRAM W/LOWER EXTREMITY N/A 07/25/2020   Procedure: ABDOMINAL AORTOGRAM W/LOWER EXTREMITY;  Surgeon: Marty Heck, MD;  Location: [place] CV LAB;  Service: Cardiovascular;  Laterality: N/A;   AMPUTATION Left 08/12/2020   Procedure: LEFT BELOW KNEE AMPUTATION;  Surgeon: Marty Heck, MD;  Location: [place];  Service: Vascular;  Laterality: Left;   AV FISTULA PLACEMENT Left 03/20/2018   Procedure: ARTERIOVENOUS (AV) FISTULA CREATION ARM;  Surgeon: [name], MD;  Location: [place];  Service: Vascular;  Laterality: Left;   [place] Left 05/21/2018   Procedure: LEFT BASILIC VEIN FISTULA SECOND STAGE;  Surgeon: [name], MD;  Location: [place];  Service: Vascular;  Laterality:  Left;   BIOPSY  03/25/2021   Procedure: BIOPSY;  Surgeon: Otis Brace, MD;  Location: [place] [facility];  Service: Gastroenterology;;   COLONOSCOPY     COLONOSCOPY WITH PROPOFOL N/A 11/30/2019   Procedure: COLONOSCOPY WITH PROPOFOL;  Surgeon: Ronnette Juniper, MD;  Location: [place];  Service: Gastroenterology;  Laterality: N/A;   ESOPHAGOGASTRODUODENOSCOPY (EGD) WITH PROPOFOL N/A 03/25/2021   Procedure: ESOPHAGOGASTRODUODENOSCOPY (EGD) WITH PROPOFOL;  Surgeon: Otis Brace, MD;  Location: [place];  Service: Gastroenterology;  Laterality: N/A;   GLAUCOMA SURGERY  2019   multiple surgeries   HEMOSTASIS CLIP PLACEMENT  11/30/2019   Procedure: HEMOSTASIS CLIP PLACEMENT;  Surgeon: Ronnette Juniper, MD;  Location: [place];  Service: Gastroenterology;;   IR CHOLANGIOGRAM EXISTING TUBE  03/21/2021   IR PERC CHOLECYSTOSTOMY  03/16/2021   IR PERC CHOLECYSTOSTOMY  03/28/2021   IR REMOVAL BILIARY DRAIN  03/21/2021   KNEE ARTHROSCOPY     MULTIPLE EXTRACTIONS WITH ALVEOLOPLASTY N/A 12/30/2019   Procedure: MULTIPLE EXTRACTION WITH ALVEOLOPLASTY;  Surgeon: Charlaine Dalton, DMD;  Location: [place];  Service: Dentistry;  Laterality: N/A;   PERIPHERAL VASCULAR INTERVENTION Left 07/25/2020   Procedure: PERIPHERAL VASCULAR INTERVENTION;  Surgeon: Marty Heck, MD;  Location: [place] CV [facility];  Service: Cardiovascular;  Laterality: Left;  superficial femoral   POLYPECTOMY  11/30/2019   Procedure: POLYPECTOMY;  Surgeon: Ronnette Juniper,  MD;  Location: [place];  Service: Gastroenterology;;   PROSTATE SURGERY     RIGHT/LEFT HEART CATH AND CORONARY ANGIOGRAPHY N/A 12/03/2019   Procedure: RIGHT/LEFT HEART CATH AND CORONARY ANGIOGRAPHY;  Surgeon: Burnell Blanks, MD;  Location: [place] [facility];  Service: Cardiovascular;  Laterality: N/A;   SUBMUCOSAL TATTOO INJECTION  11/30/2019   Procedure: SUBMUCOSAL TATTOO INJECTION;  Surgeon: Ronnette Juniper, MD;  Location: [place];  Service:  Gastroenterology;;   TEE WITHOUT CARDIOVERSION N/A 01/19/2020   Procedure: TRANSESOPHAGEAL ECHOCARDIOGRAM (TEE);  Surgeon: Burnell Blanks, MD;  Location: [place] CV LAB;  Service: Open Heart Surgery;  Laterality: N/A;   TRANSCATHETER AORTIC VALVE REPLACEMENT, TRANSFEMORAL Left 01/19/2020   Procedure: TRANSCATHETER AORTIC VALVE REPLACEMENT, LEFT TRANSFEMORAL;  Surgeon: Burnell Blanks, MD;  Location: [place] CV [facility];  Service: Open Heart Surgery;  Laterality: Left;       Initial Focused Assessment (If applicable, or please see trauma documentation): Alert male presents via EMS from SNF, no obvious trauma, vomiting  CT's Completed:   CT Head and CT C-Spine, CT chest/abdomen/pelvis w/White contrast  Interventions:  Ultrasound guided IV start, trauma labs IV zofran CTs as above Portable chest XRAY COVID swab EKG  Plan for disposition:  Pending imaging results  Consults completed:  none at the time of this note.  Event Summary: Patient arrives via EMS from [facility] after a fall from his bed. Reportedly has been vomiting for two weeks and was reaching for a bin to vomit into when he fell over and hit his head. Hx dialysis (fistula left arm), takes plavix, drain to gallbladder. No obvious trauma to head. IV access initiated, IV zofran given. Completed portable XRAY at bedside, escorted to CT. Vomiting refractory to zofran. Pending imaging results at this time. Family at bedside.  MTP Summary (If applicable): NA  Bedside handoff with ED RN Gerald Stabs.    Elijah White Elijah White  Trauma Response RN  Please call TRN at [phone] for further assistance.

## 2021-04-23 DIAGNOSIS — L89153 Pressure ulcer of sacral region, stage 3: Secondary | ICD-10-CM | POA: Diagnosis present

## 2021-04-23 LAB — BASIC METABOLIC PANEL
Anion gap: 10 (ref 5–15)
BUN: 26 mg/dL — ABNORMAL HIGH (ref 8–23)
CO2: 30 mmol/L (ref 22–32)
Calcium: 8.6 mg/dL — ABNORMAL LOW (ref 8.9–10.3)
Chloride: 99 mmol/L (ref 98–111)
Creatinine, Ser: 5.78 mg/dL — ABNORMAL HIGH (ref 0.61–1.24)
GFR, Estimated: 9 mL/min — ABNORMAL LOW (ref >60)
Glucose, Bld: 152 mg/dL — ABNORMAL HIGH (ref 70–99)
Potassium: 4 mmol/L (ref 3.5–5.1)
Sodium: 139 mmol/L (ref 135–145)

## 2021-04-23 LAB — CBC
HCT: 25.9 % — ABNORMAL LOW (ref 39.0–52.0)
Hemoglobin: 8.4 g/dL — ABNORMAL LOW (ref 13.0–17.0)
MCH: 34.3 pg — ABNORMAL HIGH (ref 26.0–34.0)
MCHC: 32.4 g/dL (ref 30.0–36.0)
MCV: 105.7 fL — ABNORMAL HIGH (ref 80.0–100.0)
Platelets: 313 10*3/uL (ref 150–400)
RBC: 2.45 MIL/uL — ABNORMAL LOW (ref 4.22–5.81)
RDW: 15.9 % — ABNORMAL HIGH (ref 11.5–15.5)
WBC: 14.9 10*3/uL — ABNORMAL HIGH (ref 4.0–10.5)
nRBC: 0.3 % — ABNORMAL HIGH (ref 0.0–0.2)

## 2021-04-23 LAB — GLUCOSE, CAPILLARY
Glucose-Capillary: 124 mg/dL — ABNORMAL HIGH (ref 70–99)
Glucose-Capillary: 152 mg/dL — ABNORMAL HIGH (ref 70–99)
Glucose-Capillary: 213 mg/dL — ABNORMAL HIGH (ref 70–99)
Glucose-Capillary: 148 mg/dL — ABNORMAL HIGH (ref 70–99)

## 2021-04-23 MED ORDER — WITCH HAZEL-GLYCERIN EX PADS: MEDICATED_PAD | CUTANEOUS | Status: DC | PRN

## 2021-04-23 MED ORDER — WITCH HAZEL-GLYCERIN EX PADS
MEDICATED_PAD | CUTANEOUS | Status: DC | PRN
  Filled 2021-04-23: qty 100

## 2021-04-23 NOTE — Assessment & Plan Note (Signed)
Present on admission, seen by wound care with 6 cm x 6 cm healing with reepithelialization stage III wound sacrum, continue topical care with Xeroform gauze with silicone foam dressing change every 3 days or as needed for swelling.  Continue positional changes frequently.

## 2021-04-23 NOTE — Progress Notes (Signed)
PROGRESS NOTE    Elijah White  PIR:[id number] DOB: 11/13/1940 DOA: 04/22/2021 PCP: Maryella Shivers, MD    Brief Narrative:  Elijah White is a 81 y.o. 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    Assessment & Plan:   Assessment and Plan: * Intractable nausea and vomiting- (present on admission) Patient reports persistent nausea and vomiting since initial acute cholecystitis episode, worse recently.  Still with active nausea, vomiting, and frequent hiccups.  EGD 03/25/2021 showed esophageal candidiasis with chronic gastritis and multiple duodenal polyps.  He was treated with Diflucan and completed course.  He was seen by speech therapy on 2/19 with no concerns and started on a mechanical soft diet with thin liquids. --Reglan 10mg  IV q8h --Zofran as needed --Thorazine as needed for hiccoughs   Aspiration pneumonia ([place])- (present on admission) Patient reports recent aspiration event concern for aspiration pneumonia versus pneumonitis.  He has moderate right pleural effusion seen on CT imaging.  WBC count elevated 20.1 on admission. --WBC 20.1>14.9 --Unasyn 3 g IV every 24 hours --CBC daily  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[place]) Reports last HD 2/17.  No indication for an emergent HD at time of admission.   --Will consult nephrology tomorrow for HD  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)- (present on admission) Diet controlled.  Continue to monitor.  Paroxysmal atrial fibrillation ([place])- (present on admission) Remains in sinus rhythm with controlled rate on admission.  Not on full dose anticoagulation due to  history of anemia and concern for GI bleed in the past.  Not currently on rate or rhythm controlling medications.  Bilateral hydroureteronephrosis- (present on admission) Seen on CT imaging without evidence of obstruction.  Question of bladder outlet obstruction although patient reportedly oliguric at baseline.   --Bladder scan PRN to assess for any acute retention.  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  CT imaging shows cholelithiasis again without evidence of biliary dilatation and unremarkable appearing pancreas.  WBC elevated, unclear if related to ongoing cholecystitis issues versus aspiration as above.   --On Unasyn above for concerns of aspiration ammonia --continue cystostomy care and monitor drain output.  Anemia in chronic kidney disease- (present on admission) Hemoglobin 8.4, stable no signs of obvious bleeding.    Chronic hypotension- (present on admission) Hypotensive on last admission, now off antihypertensives.  Has been on midodrine.  BP currently stable to mildly elevated.   --continue to hold home midodrine for now; monitor BP.  Parkinson's disease ([place])- (present on admission) --Continue Sinemet 25-100 PO TID.  At risk for delirium Developed hospital associated delirium on last admission.   --delirium precautions.  HLD (hyperlipidemia)- (present on admission) --Continue atorvastatin 10mg  PO daily.  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- (present on admission) --Continue Plavix 75mg  PO daily.  GERD (gastroesophageal reflux disease)/esophageal candidiasis - (present on admission) EGD 03/2021 showed chronic gastritis with candidal esophagitis.  Completed therapy with Diflucan. --Continue Protonix 40mg  PO BID  Blindness/glaucoma  Legally blind in the left eye.  Continue home eyedrops.  Pressure ulcer of sacral region, stage 3  ([place])- (present on admission) Present on admission, seen by wound care  with 6 cm x 6 cm healing with reepithelialization stage III wound sacrum, continue topical care with Xeroform gauze with silicone foam dressing change every 3 days or as needed for swelling.  Continue positional changes frequently.  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    DVT prophylaxis: heparin injection 5,000 Units Start: 04/22/21 2300    Code Status: DNR Family Communication: No family present at bedside this morning  Disposition Plan:  Level of care: Telemetry Cardiac Status is: Inpatient Remains inpatient appropriate because: Continues on IV antibiotics, seen by speech therapy and cleared for mechanical soft diet.  Will need hemodialysis tomorrow anticipate [facility] in 1-2 days.    Consultants:  None  Procedures:  None  Antimicrobials:  Unasyn 2/18>>   Subjective: Patient seen examined bedside, resting calmly.  No specific complaints this morning.  Seen by speech therapy and cleared for mechanical soft diet with thin liquids.  WBC count improving.  Denies headache, no chest pain, no shortness of breath, no abdominal pain currently, no current nausea/vomiting, no diarrhea, no weakness, no fatigue, no paresthesias.  No acute events overnight per nursing staff.  Objective: Vitals:   04/23/21 0200 04/23/21 0202 04/23/21 0739 04/23/21 1106  BP:   116/65 (!) 143/74  Pulse:  99 73 92  Resp:   14 17  Temp: 98.4 F (36.9 C)  98.9 F (37.2 C) 98.7 F (37.1 C)  TempSrc: Oral  Axillary Axillary  SpO2: 99% 99% 100% 97%  Weight: 66.6 kg     Height: 5\' 5"  (1.651 m)       Intake/Output Summary (Last 24 hours) at 04/23/2021 1238 Last data filed at 04/23/2021 1040 Gross per 24 hour  Intake 120 ml  Output 200 ml  Net -80 ml   Filed Weights   04/22/21 1952 04/23/21 0200  Weight: 67.8  kg 66.6 kg    Examination:  Physical Exam: GEN: NAD, alert and oriented x 3, chronically ill in appearance HEENT: NCAT, PERRL, EOMI, sclera clear, MMM PULM: CTAB w/o wheezes/crackles, normal respiratory effort, on room air CV: RRR w/o M/G/R GI: abd soft, NTND, NABS, no R/G/M, percutaneous cholecystostomy drain noted with bilious/dark drainage noted in collection bag MSK: no peripheral edema, muscle strength globally intact 5/5 bilateral upper/lower extremities NEURO: Moves all extremities independently PSYCH: normal mood/affect Integumentary: dry/intact, no rashes or wounds    Data Reviewed: I have personally reviewed following labs and imaging studies  CBC: Recent Labs  Lab 04/22/21 1920 04/23/21 0315  WBC 20.1* 14.9*  NEUTROABS 16.5*  --   HGB 10.5* 8.4*  HCT 33.1* 25.9*  MCV 108.2* 105.7*  PLT 382 478   Basic Metabolic Panel: Recent Labs  Lab 04/22/21 1920 04/23/21 0315  NA 138 139  K 4.3 4.0  CL 96* 99  CO2 28 30  GLUCOSE 190* 152*  BUN 24* 26*  CREATININE 5.56* 5.78*  CALCIUM 9.0 8.6*   GFR: Estimated Creatinine Clearance: 8.6 mL/min (A) (by C-G formula based on SCr of 5.78 mg/dL (H)). Liver Function Tests: Recent Labs  Lab 04/22/21 1920  AST 27  ALT 12  ALKPHOS 169*  BILITOT 0.5  PROT 7.3  ALBUMIN 2.7*   Recent Labs  Lab 04/22/21 1920  LIPASE 52*   No results for input(s): AMMONIA in the last 168 hours. Coagulation Profile: No results for input(s): INR, PROTIME in the last 168 hours. Cardiac Enzymes: No results for  input(s): CKTOTAL, CKMB, CKMBINDEX, TROPONINI in the last 168 hours. BNP (last 3 results) No results for input(s): PROBNP in the last 8760 hours. HbA1C: No results for input(s): HGBA1C in the last 72 hours. CBG: Recent Labs  Lab 04/23/21 0739 04/23/21 1105  GLUCAP 124* 152*   Lipid Profile: No results for input(s): CHOL, HDL, LDLCALC, TRIG, CHOLHDL, LDLDIRECT in the last 72 hours. Thyroid Function Tests: No results  for input(s): TSH, T4TOTAL, FREET4, T3FREE, THYROIDAB in the last 72 hours. Anemia Panel: No results for input(s): VITAMINB12, FOLATE, FERRITIN, TIBC, IRON, RETICCTPCT in the last 72 hours. Sepsis Labs: No results for input(s): PROCALCITON, LATICACIDVEN in the last 168 hours.  Recent Results (from the past 240 hour(s))  Resp Panel by RT-PCR (Flu A&B, Covid) Nasopharyngeal Swab     Status: None   Collection Time: 04/22/21  7:10 PM   Specimen: Nasopharyngeal Swab; Nasopharyngeal(NP) swabs in vial transport medium  Result Value Ref Range Status   SARS Coronavirus 2 by RT PCR NEGATIVE NEGATIVE Final    Comment: (NOTE) SARS-CoV-2 target nucleic acids are NOT DETECTED.  The SARS-CoV-2 RNA is generally detectable in upper respiratory specimens during the acute phase of infection. The lowest concentration of SARS-CoV-2 viral copies this assay can detect is 138 copies/mL. A negative result does not preclude SARS-Cov-2 infection and should not be used as the sole basis for treatment or other patient management decisions. A negative result may occur with  improper specimen collection/handling, submission of specimen other than nasopharyngeal swab, presence of viral mutation(s) within the areas targeted by this assay, and inadequate number of viral copies(<138 copies/mL). A negative result must be combined with clinical observations, patient history, and epidemiological information. The expected result is Negative.  Fact Sheet for Patients:  EntrepreneurPulse.com.au  Fact Sheet for Healthcare Providers:  IncredibleEmployment.be  This test is no t yet approved or cleared by the [place] FDA and  has been authorized for detection and/or diagnosis of SARS-CoV-2 by FDA under an Emergency Use Authorization (EUA). This EUA will remain  in effect (meaning this test can be used) for the duration of the COVID-19 declaration under Section 564(b)(1) of the Act,  21 U.S.C.section 360bbb-3(b)(1), unless the authorization is terminated  or revoked sooner.       Influenza A by PCR NEGATIVE NEGATIVE Final   Influenza B by PCR NEGATIVE NEGATIVE Final    Comment: (NOTE) The Xpert Xpress SARS-CoV-2/FLU/RSV plus assay is intended as an aid in the diagnosis of influenza from Nasopharyngeal swab specimens and should not be used as a sole basis for treatment. Nasal washings and aspirates are unacceptable for Xpert Xpress SARS-CoV-2/FLU/RSV testing.  Fact Sheet for Patients: EntrepreneurPulse.com.au  Fact Sheet for Healthcare Providers: IncredibleEmployment.be  This test is not yet approved or cleared by the [place] FDA and has been authorized for detection and/or diagnosis of SARS-CoV-2 by FDA under an Emergency Use Authorization (EUA). This EUA will remain in effect (meaning this test can be used) for the duration of the COVID-19 declaration under Section 564(b)(1) of the Act, 21 U.S.C. section 360bbb-3(b)(1), unless the authorization is terminated or revoked.  Performed at [facility], [place] [address] [place], [place] [address]          Radiology Studies: CT Head Wo Contrast  Result Date: 04/22/2021 CLINICAL DATA:  81 year old male with head and neck injury following fall. Vomiting. Currently on Plavix. EXAM: CT HEAD WITHOUT CONTRAST CT CERVICAL SPINE WITHOUT CONTRAST TECHNIQUE: Multidetector CT imaging of the head  and cervical spine was performed following the standard protocol without intravenous contrast. Multiplanar CT image reconstructions of the cervical spine were also generated. RADIATION DOSE REDUCTION: This exam was performed according to the departmental dose-optimization program which includes automated exposure control, adjustment of the mA and/or kV according to patient size and/or use of iterative reconstruction technique. COMPARISON:  10/19/2020 CT and prior studies FINDINGS: CT  HEAD FINDINGS Brain: No evidence of acute infarction, hemorrhage, hydrocephalus, extra-axial collection or mass lesion/mass effect. Atrophy, chronic small-vessel white matter ischemic changes, and remote bilateral basal ganglia, thalamic and RIGHT cerebellar infarcts noted. Vascular: Carotid and vertebral atherosclerotic calcifications are noted. Skull: Normal. Negative for fracture or focal lesion. Sinuses/Orbits: No acute abnormality. LEFT orbital surgical changes again noted. Other: RIGHT frontal scalp soft tissue swelling is identified. CT CERVICAL SPINE FINDINGS Alignment: Normal. Skull base and vertebrae: No acute fracture. No primary bone lesion or focal pathologic process. Soft tissues and spinal canal: No prevertebral fluid or swelling. No visible canal hematoma. Disc levels: Multilevel degenerative disc disease/spondylosis and facet arthropathy are unchanged. Upper chest: A RIGHT pleural effusion is noted and will be discussed in the chest CT. Other: A sebaceous cyst in the subcutaneous tissues of the posterior neck is again noted. IMPRESSION: 1. No evidence of acute intracranial abnormality. Atrophy, chronic small-vessel white matter ischemic changes and remote infarcts. 2. RIGHT frontal scalp soft tissue swelling without fracture. 3. No static evidence of acute injury to the cervical spine. 4. RIGHT pleural effusion. Electronically Signed   By: Margarette Canada M.D.   On: 04/22/2021 20:09   CT Cervical Spine Wo Contrast  Result Date: 04/22/2021 CLINICAL DATA:  81 year old male with head and neck injury following fall. Vomiting. Currently on Plavix. EXAM: CT HEAD WITHOUT CONTRAST CT CERVICAL SPINE WITHOUT CONTRAST TECHNIQUE: Multidetector CT imaging of the head and cervical spine was performed following the standard protocol without intravenous contrast. Multiplanar CT image reconstructions of the cervical spine were also generated. RADIATION DOSE REDUCTION: This exam was performed according to the  departmental dose-optimization program which includes automated exposure control, adjustment of the mA and/or kV according to patient size and/or use of iterative reconstruction technique. COMPARISON:  10/19/2020 CT and prior studies FINDINGS: CT HEAD FINDINGS Brain: No evidence of acute infarction, hemorrhage, hydrocephalus, extra-axial collection or mass lesion/mass effect. Atrophy, chronic small-vessel white matter ischemic changes, and remote bilateral basal ganglia, thalamic and RIGHT cerebellar infarcts noted. Vascular: Carotid and vertebral atherosclerotic calcifications are noted. Skull: Normal. Negative for fracture or focal lesion. Sinuses/Orbits: No acute abnormality. LEFT orbital surgical changes again noted. Other: RIGHT frontal scalp soft tissue swelling is identified. CT CERVICAL SPINE FINDINGS Alignment: Normal. Skull base and vertebrae: No acute fracture. No primary bone lesion or focal pathologic process. Soft tissues and spinal canal: No prevertebral fluid or swelling. No visible canal hematoma. Disc levels: Multilevel degenerative disc disease/spondylosis and facet arthropathy are unchanged. Upper chest: A RIGHT pleural effusion is noted and will be discussed in the chest CT. Other: A sebaceous cyst in the subcutaneous tissues of the posterior neck is again noted. IMPRESSION: 1. No evidence of acute intracranial abnormality. Atrophy, chronic small-vessel white matter ischemic changes and remote infarcts. 2. RIGHT frontal scalp soft tissue swelling without fracture. 3. No static evidence of acute injury to the cervical spine. 4. RIGHT pleural effusion. Electronically Signed   By: Margarette Canada M.D.   On: 04/22/2021 20:09   DG Chest Portable 1 View  Result Date: 04/22/2021 CLINICAL DATA:  Status post fall with  vomiting and weakness. EXAM: PORTABLE CHEST 1 VIEW COMPARISON:  April 07, 2021 FINDINGS: Mild to moderate severity atelectasis and/or infiltrate is seen within the right lung base. There  is a small, stable right pleural effusion. No pneumothorax is identified. The cardiac silhouette is mildly enlarged and unchanged in size. An artificial aortic valve is noted. A percutaneous biliary drainage catheter is seen overlying the right upper quadrant. Degenerative changes seen throughout the thoracic spine. IMPRESSION: 1. Mild to moderate severity right basilar atelectasis and/or infiltrate. 2. Small, stable right pleural effusion. Electronically Signed   By: Virgina Norfolk M.D.   On: 04/22/2021 19:19   CT CHEST ABDOMEN PELVIS WO CONTRAST  Result Date: 04/22/2021 CLINICAL DATA:  81 year old male chest, abdominal and pelvic pain following fall. Currently on Plavix. Patient with percutaneous cholecystostomy tube. EXAM: CT CHEST, ABDOMEN AND PELVIS WITHOUT CONTRAST TECHNIQUE: Multidetector CT imaging of the chest, abdomen and pelvis was performed following the standard protocol without IV contrast. RADIATION DOSE REDUCTION: This exam was performed according to the departmental dose-optimization program which includes automated exposure control, adjustment of the mA and/or kV according to patient size and/or use of iterative reconstruction technique. COMPARISON:  04/27/2021 abdomen/pelvis CT, 04/02/2021 chest, abdomen and pelvic CT and prior studies FINDINGS: Please note that parenchymal and vascular abnormalities may be missed as intravenous contrast was not administered. CT CHEST FINDINGS Cardiovascular: Heart size is within normal limits. Aortic valve replacement and heavy coronary artery atherosclerotic calcifications again noted. Aortic atherosclerotic calcifications again identified without thoracic aortic aneurysm. No pericardial effusion. Mediastinum/Nodes: No mediastinal hematoma or mass. No enlarged lymph nodes are identified. The thyroid gland, trachea and esophagus are unremarkable. Lungs/Pleura: A small to moderate RIGHT pleural effusion is stable to slightly increased from 04/07/2021. RIGHT  LOWER lung atelectasis is again identified. There is no evidence of airspace disease, definite mass, suspicious nodule, consolidation, pneumothorax or LEFT pleural effusion. A 3 mm LEFT UPPER lobe nodule is unchanged 2021. Musculoskeletal: No acute or suspicious bony abnormalities are noted. CT ABDOMEN PELVIS FINDINGS Hepatobiliary: The liver is unremarkable. Cholecystostomy tube within the gallbladder is again noted. Cholelithiasis again identified. No evidence of biliary dilatation. Pancreas: Unremarkable Spleen: Unremarkable Adrenals/Urinary Tract: Moderate bilateral hydroureteronephrosis identified to the bladder without obstructing cause identified. Mild bladder distention is noted. A 2.7 cm LEFT adrenal mass is unchanged from 11/26/2019. The RIGHT adrenal gland is unremarkable. Stomach/Bowel: Mild apparent circumferential rectal wall thickening is noted and may represent proctitis. There is no evidence of bowel obstruction or other bowel wall thickening. No other inflammatory changes are noted. The appendix is normal. Vascular/Lymphatic: Aortic atherosclerosis. No enlarged abdominal or pelvic lymph nodes. Reproductive: Prostate implants are present. Other: No ascites, focal collection or pneumoperitoneum. Musculoskeletal: No acute or suspicious bony abnormalities are noted. IMPRESSION: 1. No evidence of acute injury to the chest, abdomen or pelvis. 2. Mild apparent circumferential rectal wall thickening which may represent proctitis. Correlate clinically. 3. Moderate bilateral hydroureteronephrosis to the bladder without obstructing cause identified. This may be related to bladder distention/outlet obstruction. 4. Unchanged to slightly increased small to moderate RIGHT pleural effusion and RIGHT LOWER lung atelectasis. 5. Cholelithiasis and cholecystostomy tube again noted. 6. Unchanged LEFT adrenal mass. 7. 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 8. Aortic Atherosclerosis (ICD10-I70.0).  Electronically Signed   By: Margarette Canada M.D.   On: 04/22/2021 20:21        Scheduled Meds:  atorvastatin  10 mg Oral Daily   brimonidine  1 drop Right Eye TID   carbidopa-levodopa  1 tablet Oral TID   [START ON 04/24/2021] cinacalcet  60 mg Oral Q M,W,F-HD   clopidogrel  75 mg Oral Q breakfast   dorzolamide-timolol  1 drop Right Eye BID   ferric citrate  420 mg Oral TID WC   gabapentin  100 mg Oral Daily   heparin  5,000 Units Subcutaneous Q8H   latanoprost  1 drop Right Eye QHS   melatonin  5 mg Oral QHS   metoCLOPramide (REGLAN) injection  10 mg Intravenous Q8H   nystatin  5 mL Oral QID   pantoprazole  40 mg Oral BID   pilocarpine  1 drop Right Eye QID   sevelamer carbonate  1,600 mg Oral TID WC   sodium chloride flush  3 mL Intravenous Q12H   Continuous Infusions:  ampicillin-sulbactam (UNASYN) IV Stopped (04/23/21 0016)     LOS: 1 day    Time spent: 58 minutes spent on chart review, discussion with nursing staff, consultants, updating family and interview/physical exam; more than 50% of that time was spent in counseling and/or coordination of care.    Dishawn Bhargava J [place] DO [facility] Available via Epic secure chat 7am-7pm After these hours, please refer to coverage provider listed on [email or web address] 04/23/2021, 12:38 PM

## 2021-04-23 NOTE — Assessment & Plan Note (Signed)
Chronic, CT abdomen/pelvis with stable appearance of right adrenal nodule.

## 2021-04-23 NOTE — Consult Note (Signed)
WOC Nurse Consult Note: Reason for Consult: Healing Stage 3 to coccyx Wound type: Pressure plus shear force injury Present on Admission: Yes Measurement:6cm x 6cm area with healing and reepithelialization. Stage 3 wound remains in center measuring 2cn x 1.5cm with red and white tissue in wound bed, small amount light yellow wound exudate. Wound bed:As noted above Drainage (amount, consistency, odor) As noted above Periwound: intact with evidence of previous wound healing Dressing procedure/placement/frequency: I have provided guidance for topical care using a xeroform gauze wound contact layer topped with silicone foam dressing. Xeroform is to be changed daily and the silicone foam can be reused and changes every 3 days, change PRN soiling. Turning and repositioning off of the affected area will be the cornerstone of the POC.  [place] nursing team will not follow, but will remain available to this patient, the nursing and medical teams.  Please re-consult if needed. Thanks, Maudie Flakes, MSN, RN, [place], Arther Abbott  Pager# [phone]

## 2021-04-23 NOTE — Evaluation (Signed)
Clinical/Bedside Swallow Evaluation Patient Details  Name: Elijah White MRN: [id number] Date of Birth: 1940-08-08  Today's Date: 04/23/2021 Time: SLP Start Time (ACUTE ONLY): 1443 SLP Stop Time (ACUTE ONLY): 0948 SLP Time Calculation (min) (ACUTE ONLY): 20 min  Past Medical History:  Past Medical History:  Diagnosis Date   Anemia    low iron   Aortic stenosis    s/p TAVR 01/19/20   Arthritis    Cancer ([place])    prostate, s/p I-125 seed implant 05/18/05   Colon polyps ~ 1993 and 2003   Dr [name], [facility].  08/2001 colonoscopy: tubular adenoma at cecum.     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   Elevated cholesterol with high triglycerides    ESRD (end stage renal disease) (HCC)    [facility]   GERD (gastroesophageal reflux disease)    Glaucoma    Hypertension    Legally blind in left eye, as defined in [place]    has pinpoint vision in right eye   PAD (peripheral artery disease) ([place])    RBBB    Past Surgical History:  Past Surgical History:  Procedure Laterality Date   ABDOMINAL AORTOGRAM W/LOWER EXTREMITY N/A 07/25/2020   Procedure: ABDOMINAL AORTOGRAM W/LOWER EXTREMITY;  Surgeon: Marty Heck, MD;  Location: [place] [facility];  Service: Cardiovascular;  Laterality: N/A;   AMPUTATION Left 08/12/2020   Procedure: LEFT BELOW KNEE AMPUTATION;  Surgeon: Marty Heck, MD;  Location: [place];  Service: Vascular;  Laterality: Left;   AV FISTULA PLACEMENT Left 03/20/2018   Procedure: ARTERIOVENOUS (AV) FISTULA CREATION ARM;  Surgeon: [name], MD;  Location: [place];  Service: Vascular;  Laterality: Left;   [place] Left 05/21/2018   Procedure: LEFT BASILIC VEIN FISTULA SECOND STAGE;  Surgeon: [name], MD;  Location: [place];  Service: Vascular;  Laterality: Left;   BIOPSY  03/25/2021   Procedure: BIOPSY;  Surgeon: Otis Brace, MD;  Location: [facility];  Service:  Gastroenterology;;   COLONOSCOPY     COLONOSCOPY WITH PROPOFOL N/A 11/30/2019   Procedure: COLONOSCOPY WITH PROPOFOL;  Surgeon: Ronnette Juniper, MD;  Location: [place];  Service: Gastroenterology;  Laterality: N/A;   ESOPHAGOGASTRODUODENOSCOPY (EGD) WITH PROPOFOL N/A 03/25/2021   Procedure: ESOPHAGOGASTRODUODENOSCOPY (EGD) WITH PROPOFOL;  Surgeon: Otis Brace, MD;  Location: [place];  Service: Gastroenterology;  Laterality: N/A;   GLAUCOMA SURGERY  2019   multiple surgeries   HEMOSTASIS CLIP PLACEMENT  11/30/2019   Procedure: HEMOSTASIS CLIP PLACEMENT;  Surgeon: Ronnette Juniper, MD;  Location: [place];  Service: Gastroenterology;;   IR CHOLANGIOGRAM EXISTING TUBE  03/21/2021   IR PERC CHOLECYSTOSTOMY  03/16/2021   IR PERC CHOLECYSTOSTOMY  03/28/2021   IR REMOVAL BILIARY DRAIN  03/21/2021   KNEE ARTHROSCOPY     MULTIPLE EXTRACTIONS WITH ALVEOLOPLASTY N/A 12/30/2019   Procedure: MULTIPLE EXTRACTION WITH ALVEOLOPLASTY;  Surgeon: Charlaine Dalton, DMD;  Location: [place];  Service: Dentistry;  Laterality: N/A;   PERIPHERAL VASCULAR INTERVENTION Left 07/25/2020   Procedure: PERIPHERAL VASCULAR INTERVENTION;  Surgeon: Marty Heck, MD;  Location: [place] [facility];  Service: Cardiovascular;  Laterality: Left;  superficial femoral   POLYPECTOMY  11/30/2019   Procedure: POLYPECTOMY;  Surgeon: Ronnette Juniper, MD;  Location: [place];  Service: Gastroenterology;;   PROSTATE SURGERY     RIGHT/LEFT HEART CATH AND CORONARY ANGIOGRAPHY N/A 12/03/2019   Procedure: RIGHT/LEFT HEART CATH AND CORONARY ANGIOGRAPHY;  Surgeon: Burnell Blanks, MD;  Location: [place] [facility];  Service: Cardiovascular;  Laterality: N/A;   SUBMUCOSAL TATTOO INJECTION  11/30/2019   Procedure: SUBMUCOSAL TATTOO INJECTION;  Surgeon: Ronnette Juniper, MD;  Location: [place];  Service: Gastroenterology;;   TEE WITHOUT CARDIOVERSION N/A 01/19/2020   Procedure: TRANSESOPHAGEAL ECHOCARDIOGRAM (TEE);  Surgeon:  Burnell Blanks, MD;  Location: [place] [facility];  Service: Open Heart Surgery;  Laterality: N/A;   TRANSCATHETER AORTIC VALVE REPLACEMENT, TRANSFEMORAL Left 01/19/2020   Procedure: TRANSCATHETER AORTIC VALVE REPLACEMENT, LEFT TRANSFEMORAL;  Surgeon: Burnell Blanks, MD;  Location: [place] [facility];  Service: Open Heart Surgery;  Laterality: Left;   HPI:  Elijah White is an 81 y.o. male who presented to ED with intractable N/V and fall at [facility].  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 He has had several recent hospitalizations in Jan and Feb of this year with recurring N/V and abdominal pain. Has been for consulted for SLP swallow evals during prior admissions, most recently 1/27. At that time a dysphagia 3/mechanical soft diet with thin liquids was recommended. He was not happy with purees or minced foods, and there were no obvious oropharyngeal deficits that were preventing him from eating mechanical soft foods. Per chart review, pt reports intermittent aspiration events that take place during emesis episodes.    Assessment / Plan / Recommendation  Clinical Impression  [name] was willing to participate in clinical swallow assessment. His performance was consistent with most recent exam on 1/27.  There were no s/s of an oropharyngeal dysphagia, and no concerns for impaired airway protection during the swallow.  He drank thin water and consumed solids with thorough oral mastication, the appearance of a brisk swallow, and no s/sx of aspiration.  Recommend resuming a mechanical soft diet, thin liquids; give meds one at a time with water.  Mr. Nodal acknowledges that he is not fond of the food here, but that he will try to motivate himself to eat. There are no SLP f/u  needs - our service will sign off. SLP Visit Diagnosis: Dysphagia, unspecified (R13.10)    Aspiration Risk  No limitations    Diet Recommendation   Mechanical soft (dys3) thin liquids  Medication Administration: Whole meds with liquid    Other  Recommendations Oral Care Recommendations: Oral care BID    Recommendations for follow up therapy are one component of a multi-disciplinary discharge planning process, led by the attending physician.  Recommendations may be updated based on patient status, additional functional criteria and insurance authorization.  Follow up Recommendations No SLP follow up      Assistance Recommended at Discharge Frequent or constant Supervision/Assistance    Swallow Study   General Date of Onset: 04/21/21 HPI: JAYON MATTON is an 81 y.o. male who presented to ED with intractable N/V and fall at [facility].  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 He has had several recent hospitalizations in Jan and Feb of this year with recurring N/V and abdominal pain. Has been for consulted for SLP swallow evals during prior admissions, most recently 1/27. At that time a dysphagia 3/mechanical soft diet with thin liquids  was recommended. He was not happy with purees or minced foods, and there were no obvious oropharyngeal deficits that were preventing him from eating mechanical soft foods. Per chart review, pt reports intermittent aspiration events that take place during emesis episodes. Type of Study: Bedside Swallow Evaluation Previous Swallow Assessment: see HPI Diet Prior to this Study: NPO Temperature Spikes Noted: No Respiratory Status: Room air History of Recent Intubation: No Behavior/Cognition: Alert;Cooperative Oral Cavity Assessment: Within Functional  Limits Oral Care Completed by SLP: No Oral Cavity - Dentition: Missing dentition;Poor condition Vision: Impaired for self-feeding Self-Feeding Abilities: Needs assist Patient Positioning: Upright in bed Baseline Vocal Quality: Normal Volitional Cough: Strong Volitional Swallow: Able to elicit    Oral/Motor/Sensory Function Overall Oral Motor/Sensory Function: Within functional limits   Ice Chips Ice chips: Within functional limits   Thin Liquid Thin Liquid: Within functional limits    Nectar Thick Nectar Thick Liquid: Not tested   Honey Thick Honey Thick Liquid: Not tested   Puree Puree: Within functional limits   Solid     Solid: Within functional limits      Juan Quam Laurice 04/23/2021,10:19 AM  Estill Bamberg L. Tivis Ringer, [place] Office number [phone] Pager [phone]

## 2021-04-24 LAB — RENAL FUNCTION PANEL
Albumin: 2 g/dL — ABNORMAL LOW (ref 3.5–5.0)
Anion gap: 11 (ref 5–15)
BUN: 30 mg/dL — ABNORMAL HIGH (ref 8–23)
CO2: 28 mmol/L (ref 22–32)
Calcium: 8.6 mg/dL — ABNORMAL LOW (ref 8.9–10.3)
Chloride: 100 mmol/L (ref 98–111)
Creatinine, Ser: 6.48 mg/dL — ABNORMAL HIGH (ref 0.61–1.24)
GFR, Estimated: 8 mL/min — ABNORMAL LOW (ref >60)
Glucose, Bld: 151 mg/dL — ABNORMAL HIGH (ref 70–99)
Phosphorus: 1.9 mg/dL — ABNORMAL LOW (ref 2.5–4.6)
Potassium: 4.1 mmol/L (ref 3.5–5.1)
Sodium: 139 mmol/L (ref 135–145)

## 2021-04-24 LAB — GLUCOSE, CAPILLARY
Glucose-Capillary: 84 mg/dL (ref 70–99)
Glucose-Capillary: 116 mg/dL — ABNORMAL HIGH (ref 70–99)
Glucose-Capillary: 145 mg/dL — ABNORMAL HIGH (ref 70–99)
Glucose-Capillary: 158 mg/dL — ABNORMAL HIGH (ref 70–99)

## 2021-04-24 LAB — CBC
HCT: 27.1 % — ABNORMAL LOW (ref 39.0–52.0)
Hemoglobin: 8.5 g/dL — ABNORMAL LOW (ref 13.0–17.0)
MCH: 33.2 pg (ref 26.0–34.0)
MCHC: 31.4 g/dL (ref 30.0–36.0)
MCV: 105.9 fL — ABNORMAL HIGH (ref 80.0–100.0)
Platelets: 317 10*3/uL (ref 150–400)
RBC: 2.56 MIL/uL — ABNORMAL LOW (ref 4.22–5.81)
RDW: 15.9 % — ABNORMAL HIGH (ref 11.5–15.5)
WBC: 16 10*3/uL — ABNORMAL HIGH (ref 4.0–10.5)
nRBC: 0.1 % (ref 0.0–0.2)

## 2021-04-24 MED ORDER — CHLORHEXIDINE GLUCONATE CLOTH 2 % EX PADS
6.0000 | MEDICATED_PAD | Freq: Every day | CUTANEOUS | Status: DC
  Administered 2021-04-25: 6 via TOPICAL

## 2021-04-24 MED ORDER — METOCLOPRAMIDE HCL 5 MG PO TABS: 10.0000 mg | ORAL_TABLET | Freq: Four times a day (QID) | ORAL | Status: DC | PRN

## 2021-04-24 NOTE — Procedures (Signed)
° °  I was present at this dialysis session, have reviewed the session itself and made  appropriate changes Kelly Splinter MD [place] pager [phone]   04/24/2021, 8:10 PM

## 2021-04-24 NOTE — Progress Notes (Signed)
PROGRESS NOTE    STONE SPIRITO  ZOX:[id number] DOB: 1940/04/18 DOA: 04/22/2021 PCP: Maryella Shivers, MD    Brief Narrative:  Elijah White is a 81 y.o. 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    Assessment & Plan:   Assessment and Plan: * Intractable nausea and vomiting- (present on admission) Patient reports persistent nausea and vomiting since initial acute cholecystitis episode, worse recently.  Still with active nausea, vomiting, and frequent hiccups.  EGD 03/25/2021 showed esophageal candidiasis with chronic gastritis and multiple duodenal polyps.  He was treated with Diflucan and completed course.  He was seen by speech therapy on 2/19 with no concerns and started on a mechanical soft diet with thin liquids. --Reglan 10mg  PO q6h PRN --Zofran as needed   Aspiration pneumonia ([place])- (present on admission) Patient reports recent aspiration event concern for aspiration pneumonia versus pneumonitis.  He has moderate right pleural effusion seen on CT imaging.  WBC count elevated 20.1 on admission. --WBC 20.1>14.9>16.0 --Unasyn 3 g IV every 24 hours --CBC daily  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[place]) Reports last HD 2/17.  No indication for an emergent HD at time of admission.   --Nephrology consulted to continue HD while inpatient  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)- (present on admission) Diet controlled.  Continue to monitor.  Paroxysmal atrial fibrillation ([place])- (present on admission) Remains in sinus rhythm with controlled rate on admission.  Not on full dose anticoagulation due to history of anemia and  concern for GI bleed in the past.  Not currently on rate or rhythm controlling medications.  Bilateral hydroureteronephrosis- (present on admission) Seen on CT imaging without evidence of obstruction.  Question of bladder outlet obstruction although patient reportedly oliguric at baseline.   --Bladder scan PRN to assess for any acute retention.  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  CT imaging shows cholelithiasis again without evidence of biliary dilatation and unremarkable appearing pancreas.  WBC elevated, unclear if related to ongoing cholecystitis issues versus aspiration as above.   --On Unasyn above for concerns of aspiration ammonia --continue cystostomy care and monitor drain output.  Anemia in chronic kidney disease- (present on admission) Hemoglobin 8.4, stable no signs of obvious bleeding.    Chronic hypotension- (present on admission) Hypotensive on last admission, now off antihypertensives.  Has been on midodrine.  BP currently stable to mildly elevated.   --continue to hold home midodrine for now; monitor BP.  Parkinson's disease ([place])- (present on admission) --Continue Sinemet 25-100 PO TID.  At risk for delirium Developed hospital associated delirium on last admission.   --delirium precautions.  HLD (hyperlipidemia)- (present on admission) --Continue atorvastatin 10mg  PO daily.  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- (present on admission) --Continue Plavix 75mg  PO daily.  GERD (gastroesophageal reflux disease)/esophageal candidiasis - (present on admission) EGD 03/2021 showed chronic gastritis with candidal esophagitis.  Completed therapy with Diflucan. --Continue Protonix 40mg  PO BID  Blindness/glaucoma  Legally blind in the left eye.  Continue home eyedrops.  Pressure ulcer of sacral region, stage 3 ([place])- (present on  admission) Present on admission, seen by wound care with 6 cm  x 6 cm healing with reepithelialization stage III wound sacrum, continue topical care with Xeroform gauze with silicone foam dressing change every 3 days or as needed for swelling.  Continue positional changes frequently.  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    DVT prophylaxis: heparin injection 5,000 Units Start: 04/22/21 2300    Code Status: DNR Family Communication: No family present at bedside this morning  Disposition Plan:  Level of care: Telemetry Cardiac Status is: Inpatient Remains inpatient appropriate because: Continues on IV antibiotics, pending HD today, anticipate discharge back to [facility]-term care likely tomorrow  Consultants:  None  Procedures:  None  Antimicrobials:  Unasyn 2/18>>   Subjective: Patient seen examined bedside, resting calmly.  No specific complaints this morning.  Awaiting HD today.  No questions or concerns at this time.  Denies headache, no chest pain, no shortness of breath, no abdominal pain currently, no current nausea/vomiting, no diarrhea, no weakness, no fatigue, no paresthesias.  No acute events overnight per nursing staff.  Objective: Vitals:   04/23/21 2033 04/24/21 0009 04/24/21 0410 04/24/21 1145  BP: (!) 155/74 129/65 (!) 152/66 135/65  Pulse: 81 75 83 89  Resp: 18 (!) 9 16 16   Temp: 98.1 F (36.7 C) 98.6 F (37 C) 98.5 F (36.9 C) 98.1 F (36.7 C)  TempSrc: Axillary Axillary Axillary Oral  SpO2: 99% 97% 92% 99%  Weight:      Height:        Intake/Output Summary (Last 24 hours) at 04/24/2021 1233 Last data filed at 04/24/2021 0900 Gross per 24 hour  Intake 304.34 ml  Output 700 ml  Net -395.66 ml   Filed Weights   04/22/21 1952 04/23/21 0200  Weight: 67.8 kg 66.6 kg    Examination:  Physical Exam: GEN: NAD, alert and oriented x 3, chronically ill in  appearance HEENT: NCAT, PERRL, EOMI, sclera clear, MMM PULM: CTAB w/o wheezes/crackles, normal respiratory effort, on room air CV: RRR w/o M/G/R GI: abd soft, NTND, NABS, no R/G/M, percutaneous cholecystostomy drain noted with bilious/dark drainage noted in collection bag MSK: no peripheral edema, muscle strength globally intact 5/5 bilateral upper/lower extremities NEURO: Moves all extremities independently PSYCH: normal mood/affect Integumentary: dry/intact, no rashes or wounds    Data Reviewed: I have personally reviewed following labs and imaging studies  CBC: Recent Labs  Lab 04/22/21 1920 04/23/21 0315 04/24/21 0155  WBC 20.1* 14.9* 16.0*  NEUTROABS 16.5*  --   --   HGB 10.5* 8.4* 8.5*  HCT 33.1* 25.9* 27.1*  MCV 108.2* 105.7* 105.9*  PLT 382 313 242   Basic Metabolic Panel: Recent Labs  Lab 04/22/21 1920 04/23/21 0315 04/24/21 0155  NA 138 139 139  K 4.3 4.0 4.1  CL 96* 99 100  CO2 28 30 28   GLUCOSE 190* 152* 151*  BUN 24* 26* 30*  CREATININE 5.56* 5.78* 6.48*  CALCIUM 9.0 8.6* 8.6*  PHOS  --   --  1.9*   GFR: Estimated Creatinine Clearance: 7.6 mL/min (A) (by C-G formula based on SCr of 6.48 mg/dL (H)). Liver Function Tests: Recent Labs  Lab 04/22/21 1920 04/24/21 0155  AST 27  --   ALT 12  --   ALKPHOS 169*  --   BILITOT 0.5  --   PROT 7.3  --   ALBUMIN 2.7* 2.0*   Recent Labs  Lab 04/22/21 1920  LIPASE 52*   No results for input(s):  AMMONIA in the last 168 hours. Coagulation Profile: No results for input(s): INR, PROTIME in the last 168 hours. Cardiac Enzymes: No results for input(s): CKTOTAL, CKMB, CKMBINDEX, TROPONINI in the last 168 hours. BNP (last 3 results) No results for input(s): PROBNP in the last 8760 hours. HbA1C: No results for input(s): HGBA1C in the last 72 hours. CBG: Recent Labs  Lab 04/23/21 1105 04/23/21 1646 04/23/21 2031 04/24/21 0736 04/24/21 1139  GLUCAP 152* 213* 148* 84 116*   Lipid Profile: No results  for input(s): CHOL, HDL, LDLCALC, TRIG, CHOLHDL, LDLDIRECT in the last 72 hours. Thyroid Function Tests: No results for input(s): TSH, T4TOTAL, FREET4, T3FREE, THYROIDAB in the last 72 hours. Anemia Panel: No results for input(s): VITAMINB12, FOLATE, FERRITIN, TIBC, IRON, RETICCTPCT in the last 72 hours. Sepsis Labs: No results for input(s): PROCALCITON, LATICACIDVEN in the last 168 hours.  Recent Results (from the past 240 hour(s))  Resp Panel by RT-PCR (Flu A&B, Covid) Nasopharyngeal Swab     Status: None   Collection Time: 04/22/21  7:10 PM   Specimen: Nasopharyngeal Swab; Nasopharyngeal(NP) swabs in vial transport medium  Result Value Ref Range Status   SARS Coronavirus 2 by RT PCR NEGATIVE NEGATIVE Final    Comment: (NOTE) SARS-CoV-2 target nucleic acids are NOT DETECTED.  The SARS-CoV-2 RNA is generally detectable in upper respiratory specimens during the acute phase of infection. The lowest concentration of SARS-CoV-2 viral copies this assay can detect is 138 copies/mL. A negative result does not preclude SARS-Cov-2 infection and should not be used as the sole basis for treatment or other patient management decisions. A negative result may occur with  improper specimen collection/handling, submission of specimen other than nasopharyngeal swab, presence of viral mutation(s) within the areas targeted by this assay, and inadequate number of viral copies(<138 copies/mL). A negative result must be combined with clinical observations, patient history, and epidemiological information. The expected result is Negative.  Fact Sheet for Patients:  EntrepreneurPulse.com.au  Fact Sheet for Healthcare Providers:  IncredibleEmployment.be  This test is no t yet approved or cleared by the [place] FDA and  has been authorized for detection and/or diagnosis of SARS-CoV-2 by FDA under an Emergency Use Authorization (EUA). This EUA will remain  in  effect (meaning this test can be used) for the duration of the COVID-19 declaration under Section 564(b)(1) of the Act, 21 U.S.C.section 360bbb-3(b)(1), unless the authorization is terminated  or revoked sooner.       Influenza A by PCR NEGATIVE NEGATIVE Final   Influenza B by PCR NEGATIVE NEGATIVE Final    Comment: (NOTE) The Xpert Xpress SARS-CoV-2/FLU/RSV plus assay is intended as an aid in the diagnosis of influenza from Nasopharyngeal swab specimens and should not be used as a sole basis for treatment. Nasal washings and aspirates are unacceptable for Xpert Xpress SARS-CoV-2/FLU/RSV testing.  Fact Sheet for Patients: EntrepreneurPulse.com.au  Fact Sheet for Healthcare Providers: IncredibleEmployment.be  This test is not yet approved or cleared by the [place] FDA and has been authorized for detection and/or diagnosis of SARS-CoV-2 by FDA under an Emergency Use Authorization (EUA). This EUA will remain in effect (meaning this test can be used) for the duration of the COVID-19 declaration under Section 564(b)(1) of the Act, 21 U.S.C. section 360bbb-3(b)(1), unless the authorization is terminated or revoked.  Performed at [facility], [place] [address], [place], [place] [address]          Radiology Studies: CT Head Wo Contrast  Result Date: 04/22/2021  CLINICAL DATA:  81 year old male with head and neck injury following fall. Vomiting. Currently on Plavix. EXAM: CT HEAD WITHOUT CONTRAST CT CERVICAL SPINE WITHOUT CONTRAST TECHNIQUE: Multidetector CT imaging of the head and cervical spine was performed following the standard protocol without intravenous contrast. Multiplanar CT image reconstructions of the cervical spine were also generated. RADIATION DOSE REDUCTION: This exam was performed according to the departmental dose-optimization program which includes automated exposure control, adjustment of the mA and/or kV according to  patient size and/or use of iterative reconstruction technique. COMPARISON:  10/19/2020 CT and prior studies FINDINGS: CT HEAD FINDINGS Brain: No evidence of acute infarction, hemorrhage, hydrocephalus, extra-axial collection or mass lesion/mass effect. Atrophy, chronic small-vessel white matter ischemic changes, and remote bilateral basal ganglia, thalamic and RIGHT cerebellar infarcts noted. Vascular: Carotid and vertebral atherosclerotic calcifications are noted. Skull: Normal. Negative for fracture or focal lesion. Sinuses/Orbits: No acute abnormality. LEFT orbital surgical changes again noted. Other: RIGHT frontal scalp soft tissue swelling is identified. CT CERVICAL SPINE FINDINGS Alignment: Normal. Skull base and vertebrae: No acute fracture. No primary bone lesion or focal pathologic process. Soft tissues and spinal canal: No prevertebral fluid or swelling. No visible canal hematoma. Disc levels: Multilevel degenerative disc disease/spondylosis and facet arthropathy are unchanged. Upper chest: A RIGHT pleural effusion is noted and will be discussed in the chest CT. Other: A sebaceous cyst in the subcutaneous tissues of the posterior neck is again noted. IMPRESSION: 1. No evidence of acute intracranial abnormality. Atrophy, chronic small-vessel white matter ischemic changes and remote infarcts. 2. RIGHT frontal scalp soft tissue swelling without fracture. 3. No static evidence of acute injury to the cervical spine. 4. RIGHT pleural effusion. Electronically Signed   By: Margarette Canada M.D.   On: 04/22/2021 20:09   CT Cervical Spine Wo Contrast  Result Date: 04/22/2021 CLINICAL DATA:  81 year old male with head and neck injury following fall. Vomiting. Currently on Plavix. EXAM: CT HEAD WITHOUT CONTRAST CT CERVICAL SPINE WITHOUT CONTRAST TECHNIQUE: Multidetector CT imaging of the head and cervical spine was performed following the standard protocol without intravenous contrast. Multiplanar CT image  reconstructions of the cervical spine were also generated. RADIATION DOSE REDUCTION: This exam was performed according to the departmental dose-optimization program which includes automated exposure control, adjustment of the mA and/or kV according to patient size and/or use of iterative reconstruction technique. COMPARISON:  10/19/2020 CT and prior studies FINDINGS: CT HEAD FINDINGS Brain: No evidence of acute infarction, hemorrhage, hydrocephalus, extra-axial collection or mass lesion/mass effect. Atrophy, chronic small-vessel white matter ischemic changes, and remote bilateral basal ganglia, thalamic and RIGHT cerebellar infarcts noted. Vascular: Carotid and vertebral atherosclerotic calcifications are noted. Skull: Normal. Negative for fracture or focal lesion. Sinuses/Orbits: No acute abnormality. LEFT orbital surgical changes again noted. Other: RIGHT frontal scalp soft tissue swelling is identified. CT CERVICAL SPINE FINDINGS Alignment: Normal. Skull base and vertebrae: No acute fracture. No primary bone lesion or focal pathologic process. Soft tissues and spinal canal: No prevertebral fluid or swelling. No visible canal hematoma. Disc levels: Multilevel degenerative disc disease/spondylosis and facet arthropathy are unchanged. Upper chest: A RIGHT pleural effusion is noted and will be discussed in the chest CT. Other: A sebaceous cyst in the subcutaneous tissues of the posterior neck is again noted. IMPRESSION: 1. No evidence of acute intracranial abnormality. Atrophy, chronic small-vessel white matter ischemic changes and remote infarcts. 2. RIGHT frontal scalp soft tissue swelling without fracture. 3. No static evidence of acute injury to the cervical spine. 4. RIGHT pleural  effusion. Electronically Signed   By: Margarette Canada M.D.   On: 04/22/2021 20:09   DG Chest Portable 1 View  Result Date: 04/22/2021 CLINICAL DATA:  Status post fall with vomiting and weakness. EXAM: PORTABLE CHEST 1 VIEW COMPARISON:   April 07, 2021 FINDINGS: Mild to moderate severity atelectasis and/or infiltrate is seen within the right lung base. There is a small, stable right pleural effusion. No pneumothorax is identified. The cardiac silhouette is mildly enlarged and unchanged in size. An artificial aortic valve is noted. A percutaneous biliary drainage catheter is seen overlying the right upper quadrant. Degenerative changes seen throughout the thoracic spine. IMPRESSION: 1. Mild to moderate severity right basilar atelectasis and/or infiltrate. 2. Small, stable right pleural effusion. Electronically Signed   By: Virgina Norfolk M.D.   On: 04/22/2021 19:19   CT CHEST ABDOMEN PELVIS WO CONTRAST  Result Date: 04/22/2021 CLINICAL DATA:  81 year old male chest, abdominal and pelvic pain following fall. Currently on Plavix. Patient with percutaneous cholecystostomy tube. EXAM: CT CHEST, ABDOMEN AND PELVIS WITHOUT CONTRAST TECHNIQUE: Multidetector CT imaging of the chest, abdomen and pelvis was performed following the standard protocol without IV contrast. RADIATION DOSE REDUCTION: This exam was performed according to the departmental dose-optimization program which includes automated exposure control, adjustment of the mA and/or kV according to patient size and/or use of iterative reconstruction technique. COMPARISON:  04/27/2021 abdomen/pelvis CT, 04/02/2021 chest, abdomen and pelvic CT and prior studies FINDINGS: Please note that parenchymal and vascular abnormalities may be missed as intravenous contrast was not administered. CT CHEST FINDINGS Cardiovascular: Heart size is within normal limits. Aortic valve replacement and heavy coronary artery atherosclerotic calcifications again noted. Aortic atherosclerotic calcifications again identified without thoracic aortic aneurysm. No pericardial effusion. Mediastinum/Nodes: No mediastinal hematoma or mass. No enlarged lymph nodes are identified. The thyroid gland, trachea and esophagus are  unremarkable. Lungs/Pleura: A small to moderate RIGHT pleural effusion is stable to slightly increased from 04/07/2021. RIGHT LOWER lung atelectasis is again identified. There is no evidence of airspace disease, definite mass, suspicious nodule, consolidation, pneumothorax or LEFT pleural effusion. A 3 mm LEFT UPPER lobe nodule is unchanged 2021. Musculoskeletal: No acute or suspicious bony abnormalities are noted. CT ABDOMEN PELVIS FINDINGS Hepatobiliary: The liver is unremarkable. Cholecystostomy tube within the gallbladder is again noted. Cholelithiasis again identified. No evidence of biliary dilatation. Pancreas: Unremarkable Spleen: Unremarkable Adrenals/Urinary Tract: Moderate bilateral hydroureteronephrosis identified to the bladder without obstructing cause identified. Mild bladder distention is noted. A 2.7 cm LEFT adrenal mass is unchanged from 11/26/2019. The RIGHT adrenal gland is unremarkable. Stomach/Bowel: Mild apparent circumferential rectal wall thickening is noted and may represent proctitis. There is no evidence of bowel obstruction or other bowel wall thickening. No other inflammatory changes are noted. The appendix is normal. Vascular/Lymphatic: Aortic atherosclerosis. No enlarged abdominal or pelvic lymph nodes. Reproductive: Prostate implants are present. Other: No ascites, focal collection or pneumoperitoneum. Musculoskeletal: No acute or suspicious bony abnormalities are noted. IMPRESSION: 1. No evidence of acute injury to the chest, abdomen or pelvis. 2. Mild apparent circumferential rectal wall thickening which may represent proctitis. Correlate clinically. 3. Moderate bilateral hydroureteronephrosis to the bladder without obstructing cause identified. This may be related to bladder distention/outlet obstruction. 4. Unchanged to slightly increased small to moderate RIGHT pleural effusion and RIGHT LOWER lung atelectasis. 5. Cholelithiasis and cholecystostomy tube again noted. 6. Unchanged  LEFT adrenal mass. 7. 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 8. Aortic Atherosclerosis (ICD10-I70.0). Electronically Signed   By: Cleatis Polka.D.  On: 04/22/2021 20:21        Scheduled Meds:  atorvastatin  10 mg Oral Daily   brimonidine  1 drop Right Eye TID   carbidopa-levodopa  1 tablet Oral TID   [START ON 04/25/2021] Chlorhexidine Gluconate Cloth  6 each Topical Q0600   cinacalcet  60 mg Oral Q M,W,F-HD   clopidogrel  75 mg Oral Q breakfast   dorzolamide-timolol  1 drop Right Eye BID   gabapentin  100 mg Oral Daily   heparin  5,000 Units Subcutaneous Q8H   latanoprost  1 drop Right Eye QHS   melatonin  5 mg Oral QHS   nystatin  5 mL Oral QID   pantoprazole  40 mg Oral BID   pilocarpine  1 drop Right Eye QID   sodium chloride flush  3 mL Intravenous Q12H   Continuous Infusions:  ampicillin-sulbactam (UNASYN) IV Stopped (04/24/21 0621)     LOS: 2 days    Time spent: 58 minutes spent on chart review, discussion with nursing staff, consultants, updating family and interview/physical exam; more than 50% of that time was spent in counseling and/or coordination of care.    Lannah Koike J [place] DO [facility] Available via Epic secure chat 7am-7pm After these hours, please refer to coverage provider listed on [email or web address] 04/24/2021, 12:33 PM

## 2021-04-24 NOTE — Consult Note (Signed)
[facility] Renal Consultation Note    Indication for Consultation:  Management of ESRD/hemodialysis, anemia, hypertension/volume, and secondary hyperparathyroidism. PCP:  HPI: Elijah White is a 81 y.o. male with ESRD, p A-fib (no AC), severe AS s/p TAVR, PVD (s/p L BKA), and chronic cholecystitis with ongoing percutaneous cholecystostomy tube who was admitted with intractable N/V.  Brought to ED via EMS from SNF on 04/23/2021. Ongoing N/V for past few days, then reached for emesis basin and fell out of bed, striking head on table. In ED, he underwent head CT without any acute findings, only R frontal soft tissue swelling. Labs with WBC 16, Hgb 8.5, Na 139, K 4.1, CO2 28, Ca 8.6, Alb 2. CXR with small R pleural effusion. Has indwelling perc chole tube from last admit with blood drainage.  Today - reports that his vomiting is better. No CP/dyspnea. No abdominal pain today. He reports that he will likely be discharged tomorrow.  In meantime, today is his dialysis day. Dialyzes on MWF schedule at [facility] - last HD was 2/17 which he did cut his time a little (completed 2:33hr out of 4hr). No recent AVF issues.  Past Medical History:  Diagnosis Date   Anemia    low iron   Aortic stenosis    s/p TAVR 01/19/20   Arthritis    Cancer [facility])    prostate, s/p I-125 seed implant 05/18/05   Colon polyps ~ 1993 and 2003   Dr [name], [facility].  08/2001 colonoscopy: tubular adenoma at cecum.     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   Elevated cholesterol with high triglycerides    ESRD (end stage renal disease) (HCC)    [facility]   GERD (gastroesophageal reflux disease)    Glaucoma    Hypertension    Legally blind in left eye, as defined in [place]    has pinpoint vision in right eye   PAD (peripheral artery disease) ([place])    RBBB    Past Surgical History:  Procedure Laterality Date   ABDOMINAL AORTOGRAM W/LOWER EXTREMITY N/A 07/25/2020    Procedure: ABDOMINAL AORTOGRAM W/LOWER EXTREMITY;  Surgeon: Marty Heck, MD;  Location: [place] CV LAB;  Service: Cardiovascular;  Laterality: N/A;   AMPUTATION Left 08/12/2020   Procedure: LEFT BELOW KNEE AMPUTATION;  Surgeon: Marty Heck, MD;  Location: [place];  Service: Vascular;  Laterality: Left;   AV FISTULA PLACEMENT Left 03/20/2018   Procedure: ARTERIOVENOUS (AV) FISTULA CREATION ARM;  Surgeon: [name], MD;  Location: [place];  Service: Vascular;  Laterality: Left;   [place] Left 05/21/2018   Procedure: LEFT BASILIC VEIN FISTULA SECOND STAGE;  Surgeon: [name], MD;  Location: [place];  Service: Vascular;  Laterality: Left;   BIOPSY  03/25/2021   Procedure: BIOPSY;  Surgeon: Otis Brace, MD;  Location: [place] [facility];  Service: Gastroenterology;;   COLONOSCOPY     COLONOSCOPY WITH PROPOFOL N/A 11/30/2019   Procedure: COLONOSCOPY WITH PROPOFOL;  Surgeon: Ronnette Juniper, MD;  Location: [place];  Service: Gastroenterology;  Laterality: N/A;   ESOPHAGOGASTRODUODENOSCOPY (EGD) WITH PROPOFOL N/A 03/25/2021   Procedure: ESOPHAGOGASTRODUODENOSCOPY (EGD) WITH PROPOFOL;  Surgeon: Otis Brace, MD;  Location: [place];  Service: Gastroenterology;  Laterality: N/A;   GLAUCOMA SURGERY  2019   multiple surgeries   HEMOSTASIS CLIP PLACEMENT  11/30/2019   Procedure: HEMOSTASIS CLIP PLACEMENT;  Surgeon: Ronnette Juniper, MD;  Location: [place];  Service: Gastroenterology;;   IR CHOLANGIOGRAM EXISTING TUBE  03/21/2021   IR PERC CHOLECYSTOSTOMY  03/16/2021   IR PERC CHOLECYSTOSTOMY  03/28/2021   IR REMOVAL BILIARY DRAIN  03/21/2021   KNEE ARTHROSCOPY     MULTIPLE EXTRACTIONS WITH ALVEOLOPLASTY N/A 12/30/2019   Procedure: MULTIPLE EXTRACTION WITH ALVEOLOPLASTY;  Surgeon: Charlaine Dalton, DMD;  Location: [place];  Service: Dentistry;  Laterality: N/A;   PERIPHERAL VASCULAR INTERVENTION Left 07/25/2020   Procedure: PERIPHERAL  VASCULAR INTERVENTION;  Surgeon: Marty Heck, MD;  Location: [place] CV LAB;  Service: Cardiovascular;  Laterality: Left;  superficial femoral   POLYPECTOMY  11/30/2019   Procedure: POLYPECTOMY;  Surgeon: Ronnette Juniper, MD;  Location: [place];  Service: Gastroenterology;;   PROSTATE SURGERY     RIGHT/LEFT HEART CATH AND CORONARY ANGIOGRAPHY N/A 12/03/2019   Procedure: RIGHT/LEFT HEART CATH AND CORONARY ANGIOGRAPHY;  Surgeon: Burnell Blanks, MD;  Location: [place] CV [facility];  Service: Cardiovascular;  Laterality: N/A;   SUBMUCOSAL TATTOO INJECTION  11/30/2019   Procedure: SUBMUCOSAL TATTOO INJECTION;  Surgeon: Ronnette Juniper, MD;  Location: [place];  Service: Gastroenterology;;   TEE WITHOUT CARDIOVERSION N/A 01/19/2020   Procedure: TRANSESOPHAGEAL ECHOCARDIOGRAM (TEE);  Surgeon: Burnell Blanks, MD;  Location: [place] CV LAB;  Service: Open Heart Surgery;  Laterality: N/A;   TRANSCATHETER AORTIC VALVE REPLACEMENT, TRANSFEMORAL Left 01/19/2020   Procedure: TRANSCATHETER AORTIC VALVE REPLACEMENT, LEFT TRANSFEMORAL;  Surgeon: Burnell Blanks, MD;  Location: [place] CV LAB;  Service: Open Heart Surgery;  Laterality: Left;   Family History  Problem Relation Age of Onset   CAD Mother    Social History:  reports that he quit smoking about 3 years ago. His smoking use included cigarettes. He smoked an average of .5 packs per day. He has never used smokeless tobacco. He reports that he does not drink alcohol and does not use drugs.  ROS: As per HPI otherwise negative.  Physical Exam: Vitals:   04/23/21 2033 04/24/21 0009 04/24/21 0410 04/24/21 1145  BP: (!) 155/74 129/65 (!) 152/66 135/65  Pulse: 81 75 83 89  Resp: 18 (!) 9 16 16   Temp: 98.1 F (36.7 C) 98.6 F (37 C) 98.5 F (36.9 C) 98.1 F (36.7 C)  TempSrc: Axillary Axillary Axillary Oral  SpO2: 99% 97% 92% 99%  Weight:      Height:         General: Frail man, NAD. Room air. Head:  Normocephalic, atraumatic, sclera non-icteric, mucus membranes are moist. Neck: Supple without lymphadenopathy/masses. JVD not elevated. Lungs: Clear bilaterally to auscultation without wheezes, rales, or rhonchi. Breathing is unlabored. Heart: RRR with normal S1, S2. No murmurs, rubs, or gallops appreciated. Abdomen: Soft, non-tender, non-distended with normoactive bowel sounds. Perc chole tube in RUQ with bloody output Musculoskeletal:  Strength and tone appear normal for age. Lower extremities: No edema or ischemic changes, no open wounds. Neuro: Alert and oriented X 3. Moves all extremities spontaneously. Psych:  Responds to questions appropriately with a normal affect. Dialysis Access: AVF + thrill  No Known Allergies Prior to Admission medications   Medication Sig Start Date End Date Taking? Authorizing Provider  acetaminophen (TYLENOL) 500 MG tablet Take 2 tablets (1,000 mg total) by mouth every 8 (eight) hours as needed for moderate pain or headache. 04/13/21  Yes Ghimire, Henreitta Leber, MD  atorvastatin (LIPITOR) 10 MG tablet Take 1 tablet (10 mg total) by mouth in the morning. 08/16/20  Yes Rhyne, Hulen Shouts, PA-C  B Complex-C-Folic  Acid (DIALYVITE 800) 0.8 MG TABS Take 1 tablet by mouth in the morning. 05/08/19  Yes [provider]  baclofen (LIORESAL) 10 MG tablet Take 0.5 tablets (5 mg total) by mouth 3 (three) times daily as needed (hiccups). 04/13/21 04/13/22 Yes Ghimire, Henreitta Leber, MD  blood glucose meter kit and supplies KIT Dispense based on patient and insurance preference. Use up to four times daily as directed. (FOR ICD-9 250.00, 250.01). For QAC - HS accuchecks. 03/30/18  Yes Thurnell Lose, MD  brimonidine (ALPHAGAN) 0.15 % ophthalmic solution Place 1 drop into the right eye 3 (three) times daily.   Yes [provider]  carbidopa-levodopa (SINEMET) 25-100 MG tablet Take 1 tablet by mouth 3 (three) times daily. Patient taking differently: Take 1 tablet by mouth 3  (three) times daily. 10am, 4pm, 10pm 10/19/20 05/13/21 Yes Curatolo, Adam, DO  cinacalcet (SENSIPAR) 30 MG tablet Take 2 tablets (60 mg total) by mouth every Monday, Wednesday, and Friday with hemodialysis. 04/05/21  Yes Bonnielee Haff, MD  clopidogrel (PLAVIX) 75 MG tablet Take 1 tablet (75 mg total) by mouth daily with breakfast. 07/25/20  Yes Marty Heck, MD  dorzolamide-timolol (COSOPT) 22.3-6.8 MG/ML ophthalmic solution Place 1 drop into the right eye 2 (two) times daily. 06/25/17  Yes [provider]  ferric citrate (AURYXIA) 1 GM 210 MG(Fe) tablet Take 420 mg by mouth 3 (three) times daily with meals.   Yes [provider]  gabapentin (NEURONTIN) 100 MG capsule Take 1 capsule (100 mg total) by mouth daily. 10/19/20 05/13/21 Yes Curatolo, Adam, DO  hydrOXYzine (ATARAX) 25 MG tablet Take 25 mg by mouth See admin instructions. Every 8 hours as needed for itching x 14 days 04/07/21  Yes [provider]  hypromellose (GENTEAL SEVERE) 0.3 % GEL ophthalmic ointment Place 1 application into the left eye every 12 (twelve) hours as needed for dry eyes.   Yes [provider]  melatonin 5 MG TABS Take 5 mg by mouth at bedtime.   Yes [provider]  midodrine (PROAMATINE) 10 MG tablet Take 1 tablet (10 mg total) by mouth 3 (three) times daily with meals. 04/13/21  Yes Ghimire, Henreitta Leber, MD  Netarsudil-Latanoprost (ROCKLATAN) 0.02-0.005 % SOLN Place 1 drop into the right eye at bedtime.   Yes [provider]  nystatin (MYCOSTATIN) 100000 UNIT/ML suspension Take 5 mLs (500,000 Units total) by mouth 4 (four) times daily. 03/26/21  Yes Florencia Reasons, MD  ondansetron (ZOFRAN) 4 MG tablet Take 4 mg by mouth every 8 (eight) hours as needed for nausea or vomiting.   Yes [provider]  ONETOUCH VERIO test strip USE AS DIRECTED TO TEST FOUR TIMES A DAY 07/31/18  Yes [name], MD  pantoprazole (PROTONIX) 40 MG tablet Take 1 tablet (40 mg total) by mouth 2  (two) times daily. 04/13/21  Yes Ghimire, Henreitta Leber, MD  pilocarpine (PILOCAR) 4 % ophthalmic solution Place 1 drop into the right eye 4 (four) times daily.    Yes [provider]  Pollen Extracts (PROSTAT PO) Take 30 mLs by mouth in the morning and at bedtime.   Yes [provider]  polyethylene glycol (MIRALAX / GLYCOLAX) 17 g packet Take 17 g by mouth daily. Patient taking differently: Take 17 g by mouth every morning. Mix in 4 oz water and drink 03/26/21  Yes Florencia Reasons, MD  senna-docusate (SENOKOT-S) 8.6-50 MG tablet Take 1 tablet by mouth at bedtime. 03/26/21  Yes Florencia Reasons, MD  sevelamer  carbonate (RENVELA) 800 MG tablet Take 2 tablets (1,600 mg total) by mouth 3 (three) times daily with meals. 03/26/21  Yes Florencia Reasons, MD  docusate sodium (COLACE) 100 MG capsule Take 100 mg by mouth 2 (two) times daily. Patient not taking: Reported on 04/22/2021    [provider]  Nutritional Supplements (,FEEDING SUPPLEMENT, PROSOURCE PLUS) liquid Take 30 mLs by mouth 2 (two) times daily between meals. Patient not taking: Reported on 04/22/2021 04/13/21   Jonetta Osgood, MD  Vitamin D, Ergocalciferol, (DRISDOL) 1.25 MG (50000 UNIT) CAPS capsule Take 50,000 Units by mouth See admin instructions. Every Monday x 8 weeks    [provider]   Current Facility-Administered Medications  Medication Dose Route Frequency Provider Last Rate Last Admin   acetaminophen (TYLENOL) tablet 650 mg  650 mg Oral Q6H PRN Lenore Cordia, MD   650 mg at 04/23/21 1753   Or   acetaminophen (TYLENOL) suppository 650 mg  650 mg Rectal Q6H PRN Lenore Cordia, MD       Ampicillin-Sulbactam (UNASYN) 3 g in sodium chloride 0.9 % 100 mL IVPB  3 g Intravenous Q24H Lenore Cordia, MD   Stopped at 04/24/21 [id number]   artificial tears (LACRILUBE) ophthalmic ointment 1 application  1 application Left Eye [id number] PRN Lenore Cordia, MD       atorvastatin (LIPITOR) tablet 10 mg  10 mg Oral Daily Zada Finders R, MD    10 mg at 04/24/21 0904   brimonidine (ALPHAGAN) 0.15 % ophthalmic solution 1 drop  1 drop Right Eye TID Zada Finders R, MD   1 drop at 04/24/21 0913   carbidopa-levodopa (SINEMET IR) 25-100 MG per tablet immediate release 1 tablet  1 tablet Oral TID Lenore Cordia, MD   1 tablet at 04/24/21 0904   [START ON 04/25/2021] Chlorhexidine Gluconate Cloth 2 % PADS 6 each  6 each Topical Q0600 Loren Racer, PA-C       chlorproMAZINE (THORAZINE) tablet 10 mg  10 mg Oral TID PRN Lenore Cordia, MD       cinacalcet (SENSIPAR) tablet 60 mg  60 mg Oral Q M,W,F-HD Zada Finders R, MD       clopidogrel (PLAVIX) tablet 75 mg  75 mg Oral Q breakfast Zada Finders R, MD   75 mg at 04/24/21 0904   dorzolamide-timolol (COSOPT) 22.3-6.8 MG/ML ophthalmic solution 1 drop  1 drop Right Eye BID Zada Finders R, MD   1 drop at 04/24/21 0914   ferric citrate (AURYXIA) tablet 420 mg  420 mg Oral TID WC Zada Finders R, MD   420 mg at 04/24/21 0904   gabapentin (NEURONTIN) capsule 100 mg  100 mg Oral Daily Zada Finders R, MD   100 mg at 04/24/21 0904   heparin injection 5,000 Units  5,000 Units Subcutaneous Q8H Zada Finders R, MD   5,000 Units at 04/24/21 0530   hydrOXYzine (ATARAX) tablet 25 mg  25 mg Oral Q8H PRN Lenore Cordia, MD       latanoprost (XALATAN) 0.005 % ophthalmic solution 1 drop  1 drop Right Eye QHS Zada Finders R, MD   1 drop at 04/23/21 2104   melatonin tablet 5 mg  5 mg Oral QHS Zada Finders R, MD   5 mg at 04/23/21 2059   metoCLOPramide (REGLAN) injection 10 mg  10 mg Intravenous Q8H Zada Finders R, MD   10 mg at 04/24/21 0530   nystatin (MYCOSTATIN) 100000 UNIT/ML suspension  500,000 Units  5 mL Oral QID Lenore Cordia, MD   500,000 Units at 04/24/21 0906   ondansetron [facility]) tablet 4 mg  4 mg Oral Q6H PRN Lenore Cordia, MD       Or   ondansetron [facility]) injection 4 mg  4 mg Intravenous Q6H PRN Lenore Cordia, MD       pantoprazole (PROTONIX) EC tablet 40 mg  40 mg Oral BID Lenore Cordia, MD   40 mg at 04/24/21 0904   pilocarpine (PILOCAR) 4 % ophthalmic solution 1 drop  1 drop Right Eye QID Lenore Cordia, MD   1 drop at 04/24/21 0914   sevelamer carbonate (RENVELA) tablet 1,600 mg  1,600 mg Oral TID WC Zada Finders R, MD   1,600 mg at 04/24/21 0904   sodium chloride flush (NS) 0.9 % injection 3 mL  3 mL Intravenous Q12H Zada Finders R, MD   3 mL at 04/23/21 2106   witch hazel-glycerin (TUCKS) pad   Topical PRN [place] Eric J, DO   Given at 04/23/21 2100   Labs: Basic Metabolic Panel: Recent Labs  Lab 04/22/21 1920 04/23/21 0315 04/24/21 0155  NA 138 139 139  K 4.3 4.0 4.1  CL 96* 99 100  CO2 28 30 28   GLUCOSE 190* 152* 151*  BUN 24* 26* 30*  CREATININE 5.56* 5.78* 6.48*  CALCIUM 9.0 8.6* 8.6*  PHOS  --   --  1.9*   Liver Function Tests: Recent Labs  Lab 04/22/21 1920 04/24/21 0155  AST 27  --   ALT 12  --   ALKPHOS 169*  --   BILITOT 0.5  --   PROT 7.3  --   ALBUMIN 2.7* 2.0*   Recent Labs  Lab 04/22/21 1920  LIPASE 52*   CBC: Recent Labs  Lab 04/22/21 1920 04/23/21 0315 04/24/21 0155  WBC 20.1* 14.9* 16.0*  NEUTROABS 16.5*  --   --   HGB 10.5* 8.4* 8.5*  HCT 33.1* 25.9* 27.1*  MCV 108.2* 105.7* 105.9*  PLT 382 313 317   CBG: Recent Labs  Lab 04/23/21 1105 04/23/21 1646 04/23/21 2031 04/24/21 0736 04/24/21 1139  GLUCAP 152* 213* 148* 84 116*   Studies/Results: CT Head Wo Contrast  Result Date: 04/22/2021 CLINICAL DATA:  81 year old male with head and neck injury following fall. Vomiting. Currently on Plavix. EXAM: CT HEAD WITHOUT CONTRAST CT CERVICAL SPINE WITHOUT CONTRAST TECHNIQUE: Multidetector CT imaging of the head and cervical spine was performed following the standard protocol without intravenous contrast. Multiplanar CT image reconstructions of the cervical spine were also generated. RADIATION DOSE REDUCTION: This exam was performed according to the departmental dose-optimization program which includes automated  exposure control, adjustment of the mA and/or kV according to patient size and/or use of iterative reconstruction technique. COMPARISON:  10/19/2020 CT and prior studies FINDINGS: CT HEAD FINDINGS Brain: No evidence of acute infarction, hemorrhage, hydrocephalus, extra-axial collection or mass lesion/mass effect. Atrophy, chronic small-vessel white matter ischemic changes, and remote bilateral basal ganglia, thalamic and RIGHT cerebellar infarcts noted. Vascular: Carotid and vertebral atherosclerotic calcifications are noted. Skull: Normal. Negative for fracture or focal lesion. Sinuses/Orbits: No acute abnormality. LEFT orbital surgical changes again noted. Other: RIGHT frontal scalp soft tissue swelling is identified. CT CERVICAL SPINE FINDINGS Alignment: Normal. Skull base and vertebrae: No acute fracture. No primary bone lesion or focal pathologic process. Soft tissues and spinal canal: No prevertebral fluid or swelling. No visible canal hematoma. Disc levels: Multilevel degenerative  disc disease/spondylosis and facet arthropathy are unchanged. Upper chest: A RIGHT pleural effusion is noted and will be discussed in the chest CT. Other: A sebaceous cyst in the subcutaneous tissues of the posterior neck is again noted. IMPRESSION: 1. No evidence of acute intracranial abnormality. Atrophy, chronic small-vessel white matter ischemic changes and remote infarcts. 2. RIGHT frontal scalp soft tissue swelling without fracture. 3. No static evidence of acute injury to the cervical spine. 4. RIGHT pleural effusion. Electronically Signed   By: Margarette Canada M.D.   On: 04/22/2021 20:09   CT Cervical Spine Wo Contrast  Result Date: 04/22/2021 CLINICAL DATA:  81 year old male with head and neck injury following fall. Vomiting. Currently on Plavix. EXAM: CT HEAD WITHOUT CONTRAST CT CERVICAL SPINE WITHOUT CONTRAST TECHNIQUE: Multidetector CT imaging of the head and cervical spine was performed following the standard protocol  without intravenous contrast. Multiplanar CT image reconstructions of the cervical spine were also generated. RADIATION DOSE REDUCTION: This exam was performed according to the departmental dose-optimization program which includes automated exposure control, adjustment of the mA and/or kV according to patient size and/or use of iterative reconstruction technique. COMPARISON:  10/19/2020 CT and prior studies FINDINGS: CT HEAD FINDINGS Brain: No evidence of acute infarction, hemorrhage, hydrocephalus, extra-axial collection or mass lesion/mass effect. Atrophy, chronic small-vessel white matter ischemic changes, and remote bilateral basal ganglia, thalamic and RIGHT cerebellar infarcts noted. Vascular: Carotid and vertebral atherosclerotic calcifications are noted. Skull: Normal. Negative for fracture or focal lesion. Sinuses/Orbits: No acute abnormality. LEFT orbital surgical changes again noted. Other: RIGHT frontal scalp soft tissue swelling is identified. CT CERVICAL SPINE FINDINGS Alignment: Normal. Skull base and vertebrae: No acute fracture. No primary bone lesion or focal pathologic process. Soft tissues and spinal canal: No prevertebral fluid or swelling. No visible canal hematoma. Disc levels: Multilevel degenerative disc disease/spondylosis and facet arthropathy are unchanged. Upper chest: A RIGHT pleural effusion is noted and will be discussed in the chest CT. Other: A sebaceous cyst in the subcutaneous tissues of the posterior neck is again noted. IMPRESSION: 1. No evidence of acute intracranial abnormality. Atrophy, chronic small-vessel white matter ischemic changes and remote infarcts. 2. RIGHT frontal scalp soft tissue swelling without fracture. 3. No static evidence of acute injury to the cervical spine. 4. RIGHT pleural effusion. Electronically Signed   By: Margarette Canada M.D.   On: 04/22/2021 20:09   DG Chest Portable 1 View  Result Date: 04/22/2021 CLINICAL DATA:  Status post fall with vomiting and  weakness. EXAM: PORTABLE CHEST 1 VIEW COMPARISON:  April 07, 2021 FINDINGS: Mild to moderate severity atelectasis and/or infiltrate is seen within the right lung base. There is a small, stable right pleural effusion. No pneumothorax is identified. The cardiac silhouette is mildly enlarged and unchanged in size. An artificial aortic valve is noted. A percutaneous biliary drainage catheter is seen overlying the right upper quadrant. Degenerative changes seen throughout the thoracic spine. IMPRESSION: 1. Mild to moderate severity right basilar atelectasis and/or infiltrate. 2. Small, stable right pleural effusion. Electronically Signed   By: Virgina Norfolk M.D.   On: 04/22/2021 19:19   CT CHEST ABDOMEN PELVIS WO CONTRAST  Result Date: 04/22/2021 CLINICAL DATA:  81 year old male chest, abdominal and pelvic pain following fall. Currently on Plavix. Patient with percutaneous cholecystostomy tube. EXAM: CT CHEST, ABDOMEN AND PELVIS WITHOUT CONTRAST TECHNIQUE: Multidetector CT imaging of the chest, abdomen and pelvis was performed following the standard protocol without IV contrast. RADIATION DOSE REDUCTION: This exam was performed according to  the departmental dose-optimization program which includes automated exposure control, adjustment of the mA and/or kV according to patient size and/or use of iterative reconstruction technique. COMPARISON:  04/27/2021 abdomen/pelvis CT, 04/02/2021 chest, abdomen and pelvic CT and prior studies FINDINGS: Please note that parenchymal and vascular abnormalities may be missed as intravenous contrast was not administered. CT CHEST FINDINGS Cardiovascular: Heart size is within normal limits. Aortic valve replacement and heavy coronary artery atherosclerotic calcifications again noted. Aortic atherosclerotic calcifications again identified without thoracic aortic aneurysm. No pericardial effusion. Mediastinum/Nodes: No mediastinal hematoma or mass. No enlarged lymph nodes are  identified. The thyroid gland, trachea and esophagus are unremarkable. Lungs/Pleura: A small to moderate RIGHT pleural effusion is stable to slightly increased from 04/07/2021. RIGHT LOWER lung atelectasis is again identified. There is no evidence of airspace disease, definite mass, suspicious nodule, consolidation, pneumothorax or LEFT pleural effusion. A 3 mm LEFT UPPER lobe nodule is unchanged 2021. Musculoskeletal: No acute or suspicious bony abnormalities are noted. CT ABDOMEN PELVIS FINDINGS Hepatobiliary: The liver is unremarkable. Cholecystostomy tube within the gallbladder is again noted. Cholelithiasis again identified. No evidence of biliary dilatation. Pancreas: Unremarkable Spleen: Unremarkable Adrenals/Urinary Tract: Moderate bilateral hydroureteronephrosis identified to the bladder without obstructing cause identified. Mild bladder distention is noted. A 2.7 cm LEFT adrenal mass is unchanged from 11/26/2019. The RIGHT adrenal gland is unremarkable. Stomach/Bowel: Mild apparent circumferential rectal wall thickening is noted and may represent proctitis. There is no evidence of bowel obstruction or other bowel wall thickening. No other inflammatory changes are noted. The appendix is normal. Vascular/Lymphatic: Aortic atherosclerosis. No enlarged abdominal or pelvic lymph nodes. Reproductive: Prostate implants are present. Other: No ascites, focal collection or pneumoperitoneum. Musculoskeletal: No acute or suspicious bony abnormalities are noted. IMPRESSION: 1. No evidence of acute injury to the chest, abdomen or pelvis. 2. Mild apparent circumferential rectal wall thickening which may represent proctitis. Correlate clinically. 3. Moderate bilateral hydroureteronephrosis to the bladder without obstructing cause identified. This may be related to bladder distention/outlet obstruction. 4. Unchanged to slightly increased small to moderate RIGHT pleural effusion and RIGHT LOWER lung atelectasis. 5.  Cholelithiasis and cholecystostomy tube again noted. 6. Unchanged LEFT adrenal mass. 7. 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 8. Aortic Atherosclerosis (ICD10-I70.0). Electronically Signed   By: Margarette Canada M.D.   On: 04/22/2021 20:21    Dialysis Orders:  MWF at [facility]. Lives at [place], 400/500, EDW 65kg, 2K/2Ca, #2, LUE AVF, no heparin - Hectoral 58mg IV q HD - Sensipar 668mPO q HD - Mircera 10053mIV q 2 weeks (last 2/15)  Assessment/Plan:  Intractable nausea/vomiting: Chronic/recurrent issue. Better today, continue Reglan/Zofran.  Fall/head contusion: head CT negative for acute bleed  Chronic cholecystitis: Ongoing RUQ perc chole tube - serosanguinous output.  ?aspiration pneumonia: Recent aspiration event, on Unasyn per primary.  ESRD:  Continue HD per usual MWF schedule - for HD later today.  Hypertension/volume: BP fine today, no signs of overload.  Anemia: Hgb 8.5 - not due for ESA yet  Metabolic bone disease: Ca ok, Phos low - holding binders for now. Wonder if PO sensipar exacerbating vomiting - will d/w patient.  Nutrition:  PO as tolerated.  KatVeneta PentonA-C 04/24/2021, 12:07 PM  [facility]

## 2021-04-25 LAB — CBC
HCT: 25.3 % — ABNORMAL LOW (ref 39.0–52.0)
Hemoglobin: 7.9 g/dL — ABNORMAL LOW (ref 13.0–17.0)
MCH: 33.1 pg (ref 26.0–34.0)
MCHC: 31.2 g/dL (ref 30.0–36.0)
MCV: 105.9 fL — ABNORMAL HIGH (ref 80.0–100.0)
Platelets: 277 10*3/uL (ref 150–400)
RBC: 2.39 MIL/uL — ABNORMAL LOW (ref 4.22–5.81)
RDW: 15.7 % — ABNORMAL HIGH (ref 11.5–15.5)
WBC: 11.5 10*3/uL — ABNORMAL HIGH (ref 4.0–10.5)
nRBC: 0.2 % (ref 0.0–0.2)

## 2021-04-25 LAB — RENAL FUNCTION PANEL
Albumin: 2 g/dL — ABNORMAL LOW (ref 3.5–5.0)
Anion gap: 10 (ref 5–15)
BUN: 17 mg/dL (ref 8–23)
CO2: 27 mmol/L (ref 22–32)
Calcium: 8.2 mg/dL — ABNORMAL LOW (ref 8.9–10.3)
Chloride: 101 mmol/L (ref 98–111)
Creatinine, Ser: 4.68 mg/dL — ABNORMAL HIGH (ref 0.61–1.24)
GFR, Estimated: 12 mL/min — ABNORMAL LOW (ref >60)
Glucose, Bld: 91 mg/dL (ref 70–99)
Phosphorus: 1.9 mg/dL — ABNORMAL LOW (ref 2.5–4.6)
Potassium: 4 mmol/L (ref 3.5–5.1)
Sodium: 138 mmol/L (ref 135–145)

## 2021-04-25 LAB — RESP PANEL BY RT-PCR (FLU A&B, COVID) ARPGX2
Influenza A by PCR: NEGATIVE
Influenza B by PCR: NEGATIVE
SARS Coronavirus 2 by RT PCR: NEGATIVE

## 2021-04-25 LAB — GLUCOSE, CAPILLARY
Glucose-Capillary: 78 mg/dL (ref 70–99)
Glucose-Capillary: 98 mg/dL (ref 70–99)
Glucose-Capillary: 136 mg/dL — ABNORMAL HIGH (ref 70–99)

## 2021-04-25 MED ORDER — METOCLOPRAMIDE HCL 10 MG PO TABS: 10.0000 mg | ORAL_TABLET | Freq: Four times a day (QID) | ORAL | Status: DC | PRN

## 2021-04-25 MED ORDER — MIDODRINE HCL 10 MG PO TABS: 10.0000 mg | ORAL_TABLET | Freq: Three times a day (TID) | ORAL | Status: AC | PRN

## 2021-04-25 MED ORDER — AMOXICILLIN-POT CLAVULANATE 500-125 MG PO TABS: 1.0000 | ORAL_TABLET | Freq: Every day | ORAL | 0 refills | Status: DC

## 2021-04-25 MED ORDER — LOPERAMIDE HCL 2 MG PO CAPS
2.0000 mg | ORAL_CAPSULE | ORAL | Status: DC | PRN
  Administered 2021-04-25 (×2): 2 mg via ORAL
  Filled 2021-04-25 (×2): qty 1

## 2021-04-25 NOTE — NC FL2 (Signed)
[place] LEVEL OF CARE SCREENING TOOL     IDENTIFICATION  Patient Name: Elijah White Birthdate: Jul 20, 1940 Sex: male Admission Date (Current Location): 04/22/2021  [place] and [place] Number:  [name] [id number] [place] and Address:  [facility]. [facility], [place] [address] [place], [place] [address]      Provider Number: [id number]  Attending Physician Name and Address:  [place] Eric J, DO  Relative Name and Phone Number:  [name], Daughter [phone]    Current Level of Care: Hospital Recommended Level of Care: [place] Prior Approval Number:    Date Approved/Denied:   PASRR Number: [id number]  Discharge Plan: SNF    Current Diagnoses: Patient Active Problem List   Diagnosis Date Noted   Pressure ulcer of sacral region, stage 3 ([place]) 04/23/2021   Intractable nausea and vomiting 04/22/2021   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 03/28/2021 04/22/2021   Bilateral hydroureteronephrosis 04/22/2021   At risk for delirium 04/22/2021   Delirium 04/12/2021   Chronic hypotension 04/07/2021   Parkinson's disease ([place]) 04/07/2021   Acute urinary retention with foley  04/07/2021   Aspiration pneumonia ([place]) 04/07/2021   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 04/07/2021   Paroxysmal atrial fibrillation ([place]) 04/07/2021   Pressure injury of skin 03/28/2021   PAD (peripheral artery disease) ([place])    Complicated UTI (urinary tract infection)    Acute cholecystitis 03/15/2021   Hypokalemia 09/28/2020   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, 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[place]) 08/12/2020   Blindness/glaucoma  01/19/2020   Acute on chronic diastolic heart failure ([place]) 01/19/2020   RBBB    Hypercalcemia 12/16/2019   Near syncope 12/13/2019   Moderate protein-calorie malnutrition ([place]) 12/04/2019   Severe aortic stenosis s/p TAVR     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[place]) 11/26/2019   Macrocytic anemia 11/26/2019    2.5 cm left adrenal nodule 11/26/2019   Colonic mass 11/26/2019   Allergy, unspecified, initial encounter 11/16/2019   Anaphylactic shock, unspecified, initial encounter 11/16/2019   Other long term (current) drug therapy 09/05/2019   Iron deficiency anemia, unspecified 07/13/2019   Palliative care encounter 04/17/2019   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   Dependence on renal dialysis ([place]) 04/16/2019   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 kidney disease, or unspecified chronic kidney disease 04/16/2019   Localized edema 04/16/2019   Nicotine dependence, unspecified, uncomplicated 77/93/9030   Other disorders of phosphorus metabolism 04/16/2019   Pain, unspecified 04/16/2019   Pruritus, unspecified 04/16/2019   Secondary hyperparathyroidism of renal origin ([place]) 04/16/2019   Shortness of breath 04/16/2019   Unspecified glaucoma 04/16/2019   Unspecified urinary incontinence 04/16/2019   ARF (acute renal failure) ([place]) 03/25/2018   Vision loss of right eye 11/11/2017   Bilateral pseudophakia 07/11/2017   GERD (gastroesophageal reflux disease)/esophageal candidiasis  09/10/2016   HLD (hyperlipidemia) 09/10/2016   Essential hypertension 08/13/2016   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) 10/22/2006   COLONIC POLYPS, HX OF 10/22/2006    Orientation RESPIRATION BLADDER Height & Weight     Self, Time, Situation, Place  Normal Continent Weight: 146 lb 9.7 oz (66.5 kg) Height:  5\' 5"  (165.1 cm)  BEHAVIORAL SYMPTOMS/MOOD NEUROLOGICAL BOWEL NUTRITION STATUS      Continent Diet (See DC summary)  AMBULATORY STATUS COMMUNICATION OF NEEDS Skin   Extensive Assist Verbally PU Stage and Appropriate Care (Bilateral hip and leg abrasions,  Stage 2 and 3 Coccyx pressure injuries w/ foam dressing, Penis abrasion)                       Personal Care Assistance Level of Assistance   Bathing, Feeding, Dressing Bathing Assistance: Maximum assistance Feeding assistance: Maximum assistance Dressing Assistance: Maximum assistance     Functional Limitations Info  Sight, Hearing, Speech Sight Info: Impaired Hearing Info: Adequate Speech Info: Adequate    SPECIAL CARE FACTORS FREQUENCY  PT (By licensed PT), OT (By licensed OT)     PT Frequency: 5x week OT Frequency: 5x week            Contractures Contractures Info: Not present    Additional Factors Info  Code Status, Allergies Code Status Info: DNR Allergies Info: NKA           Current Medications (04/25/2021):  This is the current hospital active medication list Current Facility-Administered Medications  Medication Dose Route Frequency Provider Last Rate Last Admin   acetaminophen (TYLENOL) tablet 650 mg  650 mg Oral Q6H PRN Lenore Cordia, MD   650 mg at 04/23/21 1753   Or   acetaminophen (TYLENOL) suppository 650 mg  650 mg Rectal Q6H PRN Lenore Cordia, MD       Ampicillin-Sulbactam (UNASYN) 3 g in sodium chloride 0.9 % 100 mL IVPB  3 g Intravenous Q24H Zada Finders R, MD 200 mL/hr at 04/24/21 2234 3 g at 04/24/21 2234   artificial tears (LACRILUBE) ophthalmic ointment 1 application  1 application Left Eye S28J PRN Lenore Cordia, MD       atorvastatin (LIPITOR) tablet 10 mg  10 mg Oral Daily Zada Finders R, MD   10 mg at 04/25/21 0811   brimonidine (ALPHAGAN) 0.15 % ophthalmic solution 1 drop  1 drop Right Eye TID Zada Finders R, MD   1 drop at 04/25/21 0813   carbidopa-levodopa (SINEMET IR) 25-100 MG per tablet immediate release 1 tablet  1 tablet Oral TID Lenore Cordia, MD   1 tablet at 04/25/21 0813   Chlorhexidine Gluconate Cloth 2 % PADS 6 each  6 each Topical Q0600 Loren Racer, PA-C   6 each at 04/25/21 6811   chlorproMAZINE (THORAZINE) tablet 10 mg  10 mg Oral TID PRN Lenore Cordia, MD       cinacalcet (SENSIPAR) tablet 60 mg  60 mg Oral Q M,W,F-HD Zada Finders R, MD   60 mg  at 04/24/21 1629   clopidogrel (PLAVIX) tablet 75 mg  75 mg Oral Q breakfast Zada Finders R, MD   75 mg at 04/25/21 0811   dorzolamide-timolol (COSOPT) 22.3-6.8 MG/ML ophthalmic solution 1 drop  1 drop Right Eye BID Zada Finders R, MD   1 drop at 04/25/21 0813   gabapentin (NEURONTIN) capsule 100 mg  100 mg Oral Daily Zada Finders R, MD   100 mg at 04/25/21 0811   heparin injection 5,000 Units  5,000 Units Subcutaneous Q8H Zada Finders R, MD   5,000 Units at 04/25/21 0524   hydrOXYzine (ATARAX) tablet 25 mg  25 mg Oral Q8H PRN Zada Finders R, MD   25 mg at 04/25/21 0928   latanoprost (XALATAN) 0.005 % ophthalmic solution 1 drop  1 drop Right Eye QHS Zada Finders R, MD   1 drop at 04/24/21 2129   loperamide (IMODIUM) capsule 2 mg  2 mg Oral PRN Etta Quill, DO   2 mg at 04/25/21 [phone]  melatonin tablet 5 mg  5 mg Oral QHS Zada Finders R, MD   5 mg at 04/24/21 2118   metoCLOPramide (REGLAN) tablet 10 mg  10 mg Oral Q6H PRN [place] Donnamarie Poag, DO       nystatin (MYCOSTATIN) 100000 UNIT/ML suspension 500,000 Units  5 mL Oral QID Zada Finders R, MD   500,000 Units at 04/25/21 0812   ondansetron (ZOFRAN) tablet 4 mg  4 mg Oral Q6H PRN Lenore Cordia, MD       Or   ondansetron (ZOFRAN) injection 4 mg  4 mg Intravenous Q6H PRN Lenore Cordia, MD       pantoprazole (PROTONIX) EC tablet 40 mg  40 mg Oral BID Zada Finders R, MD   40 mg at 04/25/21 0811   pilocarpine (PILOCAR) 4 % ophthalmic solution 1 drop  1 drop Right Eye QID Zada Finders R, MD   1 drop at 04/25/21 7357   sodium chloride flush (NS) 0.9 % injection 3 mL  3 mL Intravenous Q12H Lenore Cordia, MD   3 mL at 04/25/21 0813   witch hazel-glycerin (TUCKS) pad   Topical PRN [place] Eric J, DO   Given at 04/23/21 2100     Discharge Medications: Please see discharge summary for a list of discharge medications.  Relevant Imaging Results:  Relevant Lab Results:   Additional Information [id number]. Moderna COVID-19  Vaccine 05/14/2019 , 04/16/2019  Moderna Covid-19 Booster Vaccine 01/15/2020. Dialysis MWF at [place], [place]

## 2021-04-25 NOTE — Progress Notes (Signed)
[facility] Progress Note   Subjective:  Seen in room. No further vomiting, but didn't sleep well overnight. No CP/dyspnea. Tells me going back to SNF later today.  Objective Vitals:   04/24/21 2027 04/24/21 2328 04/25/21 0350 04/25/21 0603  BP: (!) 150/93 (!) 110/56 130/69   Pulse: (!) 30 86 75 78  Resp: (!) 21 (!) 22 19 14   Temp: 98.2 F (36.8 C) 98.2 F (36.8 C)    TempSrc: Oral Oral    SpO2: 96% 99% 99% 98%  Weight:    66.5 kg  Height:       Physical Exam General: Well appearing man, NAD, room air. Heart: RRR; no murmur Lungs: CTA anteriorly Abdomen: soft, perc chole tube in RUQ Extremities: No LE edema Dialysis Access: AVF + thrill  Additional Objective Labs: Basic Metabolic Panel: Recent Labs  Lab 04/23/21 0315 04/24/21 0155 04/25/21 0449  NA 139 139 138  K 4.0 4.1 4.0  CL 99 100 101  CO2 30 28 27   GLUCOSE 152* 151* 91  BUN 26* 30* 17  CREATININE 5.78* 6.48* 4.68*  CALCIUM 8.6* 8.6* 8.2*  PHOS  --  1.9* 1.9*   Liver Function Tests: Recent Labs  Lab 04/22/21 1920 04/24/21 0155 04/25/21 0449  AST 27  --   --   ALT 12  --   --   ALKPHOS 169*  --   --   BILITOT 0.5  --   --   PROT 7.3  --   --   ALBUMIN 2.7* 2.0* 2.0*   Recent Labs  Lab 04/22/21 1920  LIPASE 52*   CBC: Recent Labs  Lab 04/22/21 1920 04/23/21 0315 04/24/21 0155 04/25/21 0449  WBC 20.1* 14.9* 16.0* 11.5*  NEUTROABS 16.5*  --   --   --   HGB 10.5* 8.4* 8.5* 7.9*  HCT 33.1* 25.9* 27.1* 25.3*  MCV 108.2* 105.7* 105.9* 105.9*  PLT 382 313 317 277   CBG: Recent Labs  Lab 04/23/21 2031 04/24/21 0736 04/24/21 1139 04/24/21 1832 04/24/21 2044  GLUCAP 148* 84 116* 145* 158*   Medications:  ampicillin-sulbactam (UNASYN) IV 3 g (04/24/21 2234)    atorvastatin  10 mg Oral Daily   brimonidine  1 drop Right Eye TID   carbidopa-levodopa  1 tablet Oral TID   Chlorhexidine Gluconate Cloth  6 each Topical Q0600   cinacalcet  60 mg Oral Q M,W,F-HD    clopidogrel  75 mg Oral Q breakfast   dorzolamide-timolol  1 drop Right Eye BID   gabapentin  100 mg Oral Daily   heparin  5,000 Units Subcutaneous Q8H   latanoprost  1 drop Right Eye QHS   melatonin  5 mg Oral QHS   nystatin  5 mL Oral QID   pantoprazole  40 mg Oral BID   pilocarpine  1 drop Right Eye QID   sodium chloride flush  3 mL Intravenous Q12H    Dialysis Orders: Dialysis Orders:  MWF at [facility]. Lives at [place], 400/500, EDW 65kg, 2K/2Ca, #2, LUE AVF, no heparin - Hectoral 33mcg IV q HD - Sensipar 60mg  PO q HD - Mircera 155mcg IV q 2 weeks (last 2/15)   Assessment/Plan:  Intractable nausea/vomiting: Chronic/recurrent issue. Better today, continue Reglan/Zofran.  Fall/head contusion: head CT negative for acute bleed  Chronic cholecystitis: Ongoing RUQ perc chole tube - serosanguinous output.  Aspiration pneumonia: Recent aspiration event, finishing course Augmentin.  ESRD:  Continue HD per usual MWF schedule - next  2/22.  Hypertension/volume: BP fine today, no signs of overload.  Anemia: Hgb 7.9 - not due for ESA yet  Metabolic bone disease: Ca ok, Phos low - holding binders for now. Wonder if PO sensipar exacerbating vomiting - d/w patient today, he is not sure but can be common trigger for GI issues, will hold for now.  Nutrition:  PO as tolerated.  Veneta Penton, PA-C 04/25/2021, 10:52 AM  [employer]

## 2021-04-25 NOTE — TOC Transition Note (Addendum)
Transition of Care [facility]) - CM/SW Discharge Note   Patient Details  Name: Elijah White MRN: [id number] Date of Birth: 07-28-1940  [facility] CM/SW Contact:  Milas Gain, [place] Phone Number: 04/25/2021, 11:53 AM   Clinical Narrative:      CSW spoke with patient at bedside who confirmed dc plan is to return to [facility]. Patient informed CSW he comes from [facility] long term. CSW spoke with [place] with [facility] who confirmed they can accept patient today for dc.   Patient will DC to: [place] date: 04/25/2021  Family notified: [name]   Transport by: Corey Harold  ?  Per MD patient ready for DC to [facility] . RN, tracy renal navigator,patient, patient's family, and facility notified of DC. Discharge Summary sent to facility. RN given number for report tele#[phone] RM#[phone]. DC packet on chart. DNR signed by MD attached to patients DC packet.Ambulance transport requested for patient.  CSW signing off.        Patient Goals and CMS Choice        Discharge Placement                       Discharge Plan and Services                                     Social Determinants of Health (SDOH) Interventions     Readmission Risk Interventions Readmission Risk Prevention Plan 04/10/2021 03/17/2021  Transportation Screening Complete Complete  Medication Review [employer]) Complete -  PCP or Specialist appointment within 3-5 days of discharge Complete -  [place] or Home Care Consult Complete Complete  SW Recovery Care/Counseling Consult Complete Complete  Palliative Care Screening Not Applicable -  [place] Complete Complete  Some recent data might be hidden

## 2021-04-25 NOTE — Progress Notes (Signed)
Pt receives out-pt HD at [facility] on MWF. Pt arrives at 10:35 for 10:50 chair time. D/C order noted for today. Contacted [facility] to make clinic aware of pt's dc today and pt will resume care tomorrow at clinic.   Melven Sartorius Renal Navigator [phone]

## 2021-04-25 NOTE — Discharge Summary (Signed)
Physician Discharge Summary  Elijah White OBS:[id number] DOB: 12/19/1940 DOA: 04/22/2021  PCP: Maryella Shivers, MD  Admit date: 04/22/2021 Discharge date: 04/25/2021  Admitted From: [facility] Disposition: [facility]  Recommendations for Outpatient Follow-up:  Follow up with PCP in 1-2 weeks Outpatient follow-up with general surgery/IR drain clinic in regards to chronic cholecystitis with cholecystostomy drain in place Continue Augmentin 500-125 mg p.o. daily to complete 10-day course for aspiration pneumonia versus chronic cholecystitis, take medication after dialysis on HD days Change midodrine from schedule to 3 times daily as needed as blood pressure remain normal during hospitalization off of midodrine. Renvela discontinued due to low phosphorus levels, recommend renal panel 1 week. Recommend repeat CBC 1 week to assess WBC and hemoglobin level  Discharge Condition: Stable CODE STATUS: DNR Diet recommendation: Renal diet, mechanical soft diet with thin liquids  History of present illness:  Elijah White is a 81 y.o. 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  Hospital course:  Assessment and Plan: * Intractable nausea and vomiting- (present on admission) Patient reports persistent nausea and vomiting since initial acute cholecystitis episode, worse recently.  Still with active nausea, vomiting, and frequent hiccups.  EGD 03/25/2021 showed esophageal candidiasis with chronic gastritis and multiple duodenal polyps.  He was treated with Diflucan and completed course.  He was seen by speech therapy on 2/19 with no concerns and  started on a mechanical soft diet with thin liquids.  Continue Zofran/Reglan as needed.    Aspiration pneumonia ([place])- (present on admission) Patient reports recent aspiration event concern for aspiration pneumonia versus pneumonitis.  He has moderate right pleural effusion seen on CT imaging.  WBC count elevated 20.1 on admission.  Patient was started on IV Unasyn with improvement of WBC count to 11.1 at time of discharge.  We will continue Augmentin 500-125 mg p.o. every 24 hours, renally dosed to complete 10-day course.  Recommend antibiotic following dialysis on HD days.  Recommend repeat CBC in 1 week to ensure WBC count is normalized.  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  Renvela discontinued due to low phosphorus on admission.  Repeat renal panel 1 week.  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    Paroxysmal atrial fibrillation ([place])- (present on admission) Remains in sinus rhythm with controlled rate on admission.  Not on full dose anticoagulation due to history of anemia and concern for GI bleed in the past.  Not currently on rate or rhythm controlling medications.  Bilateral hydroureteronephrosis- (present on admission) Seen on CT imaging without evidence of obstruction.  Question of bladder outlet obstruction although patient reportedly oliguric at baseline.    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  CT imaging shows cholelithiasis again without evidence of biliary dilatation and unremarkable appearing pancreas.  WBC elevated, unclear if related to ongoing cholecystitis issues versus aspiration as above.  Was started on Unasyn for concerns of aspiration pneumonia as above, will continue Augmentin on discharge to complete 10-day course.  Continue  cystostomy care and outpatient follow-up with IR drain clinic and general surgery.  Anemia in chronic kidney disease- (present on admission) Hemoglobin 7.9, stable no signs of obvious bleeding.    Chronic hypotension- (present on admission) Hypotensive on last admission, now off antihypertensives.  Has been on midodrine.  BP currently stable to mildly elevated.  Continue midodrine, now as needed as patient's blood pressure have been normotensive off of midodrine while inpatient.  Parkinson's disease ([place])- (present on admission) Continue Sinemet 25-100 PO TID.  At risk for delirium Developed hospital associated delirium on last admission.  No concerns during this hospitalization, but continue delirium precautions.  HLD (hyperlipidemia)- (present on admission) Continue atorvastatin 10mg  PO daily.  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- (present on admission) Continue Plavix 75mg  PO daily.  GERD (gastroesophageal reflux disease)/esophageal candidiasis - (present on admission) EGD 03/2021 showed chronic gastritis with candidal esophagitis.  Completed therapy with Diflucan.Continue Protonix 40mg  PO BID  Blindness/glaucoma  Legally blind in the left eye.  Continue home eyedrops.  Pressure ulcer of sacral region, stage 3 ([place])- (present on admission) Present on admission, seen by wound care with 6 cm x 6 cm healing with reepithelialization stage III wound sacrum, continue topical care with Xeroform gauze with silicone foam dressing change every 3 days or as needed for swelling.  Continue positional changes frequently.  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 03/28/2021   Anemia in chronic kidney disease   Chronic hypotension   Parkinson's disease (HCC)   At risk for delirium   HLD (hyperlipidemia)   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   GERD (gastroesophageal reflux disease)/esophageal candidiasis    Blindness/glaucoma    2.5 cm left adrenal nodule   Pressure ulcer of sacral region, stage 3 [facility])    Discharge Instructions  Discharge Instructions     Call MD for:  difficulty breathing, headache or visual disturbances   Complete by: As directed    Call MD for:  persistant dizziness or light-headedness   Complete by: As directed    Call MD for:  persistant nausea and vomiting   Complete by: As directed    Call MD for:  severe uncontrolled pain   Complete by: As directed    Call MD for:  temperature >100.4   Complete by: As directed    Diet - low sodium heart healthy   Complete by: As directed    Discharge wound care:   Complete by: As directed    Wound care to coccygeal Stage 3 pressure injury:  cleanse with NS, pat dry. Cover affected area with xeroform gauze, top with dry gauze and cover with silicone foam for the sacrum. Change xeroform daily, may reuse silicone foam for up to 3 days. Change PRN soiling.   Increase activity slowly   Complete by: As directed       Allergies as of 04/25/2021   No Known Allergies      Medication List     STOP taking these medications    (feeding supplement) PROSource Plus liquid   docusate sodium 100 MG capsule Commonly known as: COLACE   sevelamer carbonate  800 MG tablet Commonly known as: RENVELA       TAKE these medications    acetaminophen 500 MG tablet Commonly known as: TYLENOL Take 2 tablets (1,000 mg total) by mouth every 8 (eight) hours as needed for moderate pain or headache.   amoxicillin-clavulanate 500-125 MG  tablet Commonly known as: Augmentin Take 1 tablet (500 mg total) by mouth daily at 6 (six) AM for 8 days. Take following dialysis on HD days.   atorvastatin 10 MG tablet Commonly known as: LIPITOR Take 1 tablet (10 mg total) by mouth in the morning.   baclofen 10 MG tablet Commonly known as: LIORESAL Take 0.5 tablets (5 mg total) by mouth 3 (three) times daily as needed (hiccups).   blood glucose meter kit and supplies Kit Dispense based on patient and insurance preference. Use up to four times daily as directed. (FOR ICD-9 250.00, 250.01). For QAC - HS accuchecks.   brimonidine 0.15 % ophthalmic solution Commonly known as: ALPHAGAN Place 1 drop into the right eye 3 (three) times daily.   carbidopa-levodopa 25-100 MG tablet Commonly known as: Sinemet Take 1 tablet by mouth 3 (three) times daily. What changed: additional instructions   cinacalcet 30 MG tablet Commonly known as: SENSIPAR Take 2 tablets (60 mg total) by mouth every Monday, Wednesday, and Friday with hemodialysis.   clopidogrel 75 MG tablet Commonly known as: PLAVIX Take 1 tablet (75 mg total) by mouth daily with breakfast.   Dialyvite 800 0.8 MG Tabs Take 1 tablet by mouth in the morning.   dorzolamide-timolol 22.3-6.8 MG/ML ophthalmic solution Commonly known as: COSOPT Place 1 drop into the right eye 2 (two) times daily.   ferric citrate 1 GM 210 MG(Fe) tablet Commonly known as: AURYXIA Take 420 mg by mouth 3 (three) times daily with meals.   gabapentin 100 MG capsule Commonly known as: Neurontin Take 1 capsule (100 mg total) by mouth daily.   GenTeal Severe 0.3 % Gel ophthalmic ointment Generic drug: hypromellose Place 1 application into the left eye every 12 (twelve) hours as needed for dry eyes.   hydrOXYzine 25 MG tablet Commonly known as: ATARAX Take 25 mg by mouth See admin instructions. Every 8 hours as needed for itching x 14 days   melatonin 5 MG Tabs Take 5 mg by mouth at bedtime.    metoCLOPramide 10 MG tablet Commonly known as: REGLAN Take 1 tablet (10 mg total) by mouth every 6 (six) hours as needed for nausea or vomiting.   midodrine 10 MG tablet Commonly known as: PROAMATINE Take 1 tablet (10 mg total) by mouth 3 (three) times daily with meals as needed (SBP <100). What changed:  when to take this reasons to take this   nystatin 100000 UNIT/ML suspension Commonly known as: MYCOSTATIN Take 5 mLs (500,000 Units total) by mouth 4 (four) times daily.   ondansetron 4 MG tablet Commonly known as: ZOFRAN Take 4 mg by mouth every 8 (eight) hours as needed for nausea or vomiting.   OneTouch Verio test strip Generic drug: glucose blood USE AS DIRECTED TO TEST FOUR TIMES A DAY   pantoprazole 40 MG tablet Commonly known as: PROTONIX Take 1 tablet (40 mg total) by mouth 2 (two) times daily.   pilocarpine 4 % ophthalmic solution Commonly known as: PILOCAR Place 1 drop into the right eye 4 (four) times daily.   polyethylene glycol 17 g packet Commonly known as: MIRALAX / GLYCOLAX Take 17 g by mouth daily. What changed:  when to  take this additional instructions   PROSTAT PO Take 30 mLs by mouth in the morning and at bedtime.   Rocklatan 0.02-0.005 % Soln Generic drug: Netarsudil-Latanoprost Place 1 drop into the right eye at bedtime.   senna-docusate 8.6-50 MG tablet Commonly known as: Senokot-S Take 1 tablet by mouth at bedtime.   Vitamin D (Ergocalciferol) 1.25 MG (50000 UNIT) Caps capsule Commonly known as: DRISDOL Take 50,000 Units by mouth See admin instructions. Every Monday x 8 weeks               Discharge Care Instructions  (From admission, onward)           Start     Ordered   04/25/21 0000  Discharge wound care:       Comments: Wound care to coccygeal Stage 3 pressure injury:  cleanse with NS, pat dry. Cover affected area with xeroform gauze, top with dry gauze and cover with silicone foam for the sacrum. Change xeroform  daily, may reuse silicone foam for up to 3 days. Change PRN soiling.   04/25/21 1036            Follow-up Information     Maryella Shivers, MD. Schedule an appointment as soon as possible for a visit in 1 week(s).   Specialty: Family Medicine Contact information: [facility] [place] [address] [phone]         Geralynn Rile, MD .   Specialties: Cardiology, Internal Medicine, Radiology Contact information: [place] [address] [phone]                No Known Allergies  Consultations: Nephrology   Procedures/Studies: CT ABDOMEN PELVIS WO CONTRAST  Result Date: 03/31/2021 CLINICAL DATA:  Nausea and vomiting.  On dialysis. EXAM: CT ABDOMEN AND PELVIS WITHOUT CONTRAST TECHNIQUE: Multidetector CT imaging of the abdomen and pelvis was performed following the standard protocol without IV contrast. RADIATION DOSE REDUCTION: This exam was performed according to the departmental dose-optimization program which includes automated exposure control, adjustment of the mA and/or kV according to patient size and/or use of iterative reconstruction technique. COMPARISON:  Abdominal x-ray 03/31/2021. CT abdomen and pelvis 03/23/2021. FINDINGS: Lower chest: There is a moderate-sized right pleural effusion which has mildly increased. There is compressive atelectasis of the right lower lobe. There is a small left pleural effusion which appears stable. TAVR present. Hepatobiliary: The gallbladder is dilated and thick-walled. Heterogeneous hyperdensity is again seen within the gallbladder likely related to hemorrhage. Small layering gallstones are again noted. There is a new percutaneous cholecystostomy tube in place. There is a small amount of air in the gallbladder likely related to tube placement. There is no definite biliary ductal dilatation. No focal liver lesions are seen. Pancreas: Unremarkable. No pancreatic ductal dilatation or  surrounding inflammatory changes. Spleen: Normal in size without focal abnormality. Adrenals/Urinary Tract: 2.5 x 2.1 cm left adrenal nodule is indeterminate and unchanged. Right adrenal gland within normal limits. There is bilateral renal atrophy. No urinary tract calculi or hydronephrosis. Bladder is decompressed by Foley catheter. Stomach/Bowel: Stomach is within normal limits. Appendix appears normal. No evidence of bowel wall thickening, distention, or inflammatory changes. Vascular/Lymphatic: Aortic atherosclerosis. No enlarged abdominal or pelvic lymph nodes. Reproductive: Prostate radiotherapy seeds are again noted. Other: There is a small amount of free fluid in the pelvis similar to prior study. There is a small fat containing left inguinal hernia. Mild body wall edema persists. There is also a fat containing umbilical  hernia. Musculoskeletal: No acute or significant osseous findings. IMPRESSION: 1. New cholecystostomy tube in place. 2. The gallbladder is dilated. Cholelithiasis again seen. Heterogeneous hyperdensity in the gallbladder likely represents hemorrhage, unchanged. 3. Moderate right pleural effusion has increased. Small left pleural effusion is stable. 4. Small volume ascites and body wall edema are stable. 5.  Aortic Atherosclerosis (ICD10-I70.0). Electronically Signed   By: Ronney Asters M.D.   On: 03/31/2021 18:22   DG Chest 2 View  Result Date: 03/27/2021 CLINICAL DATA:  Assess, abdominal pain, nausea and vomiting since yesterday, recent hospitalization for cholecystitis and complicated UTI EXAM: CHEST - 2 VIEW COMPARISON:  03/17/2021 FINDINGS: Normal heart size post TAVR. Mediastinal contours and pulmonary vascularity normal. Moderate to large RIGHT pleural effusion with small LEFT pleural effusion. Mild RIGHT basilar atelectasis and central peribronchial thickening. Upper lungs clear. No pneumothorax or acute osseous findings. IMPRESSION: Moderate to large RIGHT and small LEFT pleural  effusions with associated RIGHT basilar atelectasis. Electronically Signed   By: Lavonia Dana M.D.   On: 03/27/2021 15:20   CT Head Wo Contrast  Result Date: 04/22/2021 CLINICAL DATA:  81 year old male with head and neck injury following fall. Vomiting. Currently on Plavix. EXAM: CT HEAD WITHOUT CONTRAST CT CERVICAL SPINE WITHOUT CONTRAST TECHNIQUE: Multidetector CT imaging of the head and cervical spine was performed following the standard protocol without intravenous contrast. Multiplanar CT image reconstructions of the cervical spine were also generated. RADIATION DOSE REDUCTION: This exam was performed according to the departmental dose-optimization program which includes automated exposure control, adjustment of the mA and/or kV according to patient size and/or use of iterative reconstruction technique. COMPARISON:  10/19/2020 CT and prior studies FINDINGS: CT HEAD FINDINGS Brain: No evidence of acute infarction, hemorrhage, hydrocephalus, extra-axial collection or mass lesion/mass effect. Atrophy, chronic small-vessel white matter ischemic changes, and remote bilateral basal ganglia, thalamic and RIGHT cerebellar infarcts noted. Vascular: Carotid and vertebral atherosclerotic calcifications are noted. Skull: Normal. Negative for fracture or focal lesion. Sinuses/Orbits: No acute abnormality. LEFT orbital surgical changes again noted. Other: RIGHT frontal scalp soft tissue swelling is identified. CT CERVICAL SPINE FINDINGS Alignment: Normal. Skull base and vertebrae: No acute fracture. No primary bone lesion or focal pathologic process. Soft tissues and spinal canal: No prevertebral fluid or swelling. No visible canal hematoma. Disc levels: Multilevel degenerative disc disease/spondylosis and facet arthropathy are unchanged. Upper chest: A RIGHT pleural effusion is noted and will be discussed in the chest CT. Other: A sebaceous cyst in the subcutaneous tissues of the posterior neck is again noted.  IMPRESSION: 1. No evidence of acute intracranial abnormality. Atrophy, chronic small-vessel white matter ischemic changes and remote infarcts. 2. RIGHT frontal scalp soft tissue swelling without fracture. 3. No static evidence of acute injury to the cervical spine. 4. RIGHT pleural effusion. Electronically Signed   By: Margarette Canada M.D.   On: 04/22/2021 20:09   CT CHEST W CONTRAST  Result Date: 04/02/2021 CLINICAL DATA:  Acute abdominal pain.  Pneumonia. EXAM: CT CHEST, ABDOMEN, AND PELVIS WITH CONTRAST TECHNIQUE: Multidetector CT imaging of the chest, abdomen and pelvis was performed following the standard protocol during bolus administration of intravenous contrast. RADIATION DOSE REDUCTION: This exam was performed according to the departmental dose-optimization program which includes automated exposure control, adjustment of the mA and/or kV according to patient size and/or use of iterative reconstruction technique. CONTRAST:  127mL OMNIPAQUE IOHEXOL 350 MG/ML SOLN COMPARISON:  CT abdomen and pelvis 03/31/2021. CT angiogram chest abdomen and pelvis 12/01/2019. FINDINGS: CT CHEST FINDINGS  Cardiovascular: The heart is mildly enlarged. There is no pericardial effusion. Aorta is normal in size. Patient is status post TAVR. There are atherosclerotic calcifications of the aorta and coronary arteries. Mediastinum/Nodes: No enlarged mediastinal, hilar, or axillary lymph nodes. Thyroid gland, trachea, and esophagus demonstrate no significant findings. Lungs/Pleura: There are small bilateral pleural effusions, right greater than left. Right pleural effusion has decreased from the prior examination. Right pleural effusion is likely partially loculated posteriorly. There is also small amount of loculated fluid in the right major fissure. There are patchy ground-glass and airspace opacities in the right upper lobe and right lower lobe. There is atelectasis in the left lower lobe. There is no evidence for pneumothorax.  There is some secretions in the trachea. Trachea and central airways appear patent. There is a stable 3 mm nodule in the left upper lobe image 4/52. No new pulmonary nodules are identified. Musculoskeletal: No acute fractures. Degenerative changes affect the spine. CT ABDOMEN PELVIS FINDINGS Hepatobiliary: Percutaneous cholecystostomy tube is again seen. The gallbladder is dilated similar to the prior study. There is a small amount of air in the gallbladder. Layering small stones are unchanged. Heterogeneous hyperdensity throughout the gallbladder likely represents hemorrhagic products, also unchanged. There is no biliary ductal dilatation. No focal liver lesions are seen. Pancreas: Unremarkable. No pancreatic ductal dilatation or surrounding inflammatory changes. Spleen: Normal in size without focal abnormality. Adrenals/Urinary Tract: 2.5 cm left adrenal nodule is indeterminate and unchanged. Right adrenal gland is within normal limits. The bladder is decompressed by Foley catheter. There is no hydronephrosis. There is a cortical hypodensity in the superior pole the left kidney which is too small to characterize, likely cysts. Stomach/Bowel: Appendix appears normal. No evidence of bowel wall thickening, distention, or inflammatory changes. There is scattered air-fluid levels throughout the colon. The stomach is completely decompressed and gastric wall thickening can not be excluded. Vascular/Lymphatic: Aortic atherosclerosis. No enlarged abdominal or pelvic lymph nodes. Reproductive: Prostate radiotherapy seeds are present. Other: There is a small fat containing umbilical hernia. There is trace ascites, decreased from prior. There is mild body wall edema which is similar to the prior study. Musculoskeletal: No acute or significant osseous findings. IMPRESSION: 1. Small right pleural effusion has decreased in size. This effusion is partially loculated posteriorly and within the major fissure. Loculation may be  secondary to chronicity, hemorrhage or malignancy. 2. Patchy ground-glass and airspace opacities in the right upper lobe and right lower lobe most consistent with pneumonia. 3. Stable small left pleural effusion. 4. The gallbladder is dilated. Cholecystostomy tube is in place. Gallbladder air, hemorrhagic products and gallstones are present. Appearance is unchanged from the prior study. 5. Stable body wall edema and trace ascites. 6. Stable indeterminate left adrenal nodule. 7. Stable 3 mm left upper lobe pulmonary nodule. No follow-up needed if patient is low-risk. Non-contrast chest CT can be considered in 12 months if patient is high-risk. This recommendation follows the consensus statement: Guidelines for Management of Incidental Pulmonary Nodules Detected on CT Images: From the Fleischner Society 2017; Radiology 2017; 284:228-243. Electronically Signed   By: Ronney Asters M.D.   On: 04/02/2021 21:11   CT Cervical Spine Wo Contrast  Result Date: 04/22/2021 CLINICAL DATA:  81 year old male with head and neck injury following fall. Vomiting. Currently on Plavix. EXAM: CT HEAD WITHOUT CONTRAST CT CERVICAL SPINE WITHOUT CONTRAST TECHNIQUE: Multidetector CT imaging of the head and cervical spine was performed following the standard protocol without intravenous contrast. Multiplanar CT image reconstructions of the cervical  spine were also generated. RADIATION DOSE REDUCTION: This exam was performed according to the departmental dose-optimization program which includes automated exposure control, adjustment of the mA and/or kV according to patient size and/or use of iterative reconstruction technique. COMPARISON:  10/19/2020 CT and prior studies FINDINGS: CT HEAD FINDINGS Brain: No evidence of acute infarction, hemorrhage, hydrocephalus, extra-axial collection or mass lesion/mass effect. Atrophy, chronic small-vessel white matter ischemic changes, and remote bilateral basal ganglia, thalamic and RIGHT cerebellar  infarcts noted. Vascular: Carotid and vertebral atherosclerotic calcifications are noted. Skull: Normal. Negative for fracture or focal lesion. Sinuses/Orbits: No acute abnormality. LEFT orbital surgical changes again noted. Other: RIGHT frontal scalp soft tissue swelling is identified. CT CERVICAL SPINE FINDINGS Alignment: Normal. Skull base and vertebrae: No acute fracture. No primary bone lesion or focal pathologic process. Soft tissues and spinal canal: No prevertebral fluid or swelling. No visible canal hematoma. Disc levels: Multilevel degenerative disc disease/spondylosis and facet arthropathy are unchanged. Upper chest: A RIGHT pleural effusion is noted and will be discussed in the chest CT. Other: A sebaceous cyst in the subcutaneous tissues of the posterior neck is again noted. IMPRESSION: 1. No evidence of acute intracranial abnormality. Atrophy, chronic small-vessel white matter ischemic changes and remote infarcts. 2. RIGHT frontal scalp soft tissue swelling without fracture. 3. No static evidence of acute injury to the cervical spine. 4. RIGHT pleural effusion. Electronically Signed   By: Margarette Canada M.D.   On: 04/22/2021 20:09   NM Hepatobiliary Liver Func  Result Date: 03/28/2021 CLINICAL DATA:  Ultrasound CT findings concern for acute cholecystitis. EXAM: NUCLEAR MEDICINE HEPATOBILIARY IMAGING TECHNIQUE: Sequential images of the abdomen were obtained out to 60 minutes following intravenous administration of radiopharmaceutical. RADIOPHARMACEUTICALS:  5.3 mCi Tc-25m  Choletec IV COMPARISON:  Ultrasound 03/27/2021, CT 03/23/2021 FINDINGS: Prompt clearance radiotracer from blood pool and homogeneous uptake within liver. Counts are present within the common bile duct by 15 minutes. Counts reached the proximal small bowel by 20 minutes. No filling of the gallbladder at 60 minutes. IV morphine was administered to augment filling of the gallbladder. No filling of the gallbladder present on 30 minutes  of post morphine imaging. IMPRESSION: Non filling the gallbladder is consistent with ACUTE CHOLECYSTITIS. Patent common bile duct. These results will be called to the ordering clinician or representative by the Radiologist Assistant, and communication documented in the PACS or [employer]. Electronically Signed   By: Suzy Bouchard M.D.   On: 03/28/2021 16:02   CT ABDOMEN PELVIS W CONTRAST  Result Date: 04/07/2021 CLINICAL DATA:  Abdominal pain, acute, nonlocalized. EXAM: CT ABDOMEN AND PELVIS WITH CONTRAST TECHNIQUE: Multidetector CT imaging of the abdomen and pelvis was performed using the standard protocol following bolus administration of intravenous contrast. RADIATION DOSE REDUCTION: This exam was performed according to the departmental dose-optimization program which includes automated exposure control, adjustment of the mA and/or kV according to patient size and/or use of iterative reconstruction technique. CONTRAST:  171mL OMNIPAQUE IOHEXOL 350 MG/ML SOLN COMPARISON:  04/02/2021 FINDINGS: Lower chest: Moderate right pleural effusion has slightly increased in size with progressive, compressive atelectasis of the right lower lobe. Tiny left pleural effusion with minimal left basilar compressive atelectasis. Extensive multi-vessel coronary artery calcification again noted. 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 Cardiac size within normal limits. Hepatobiliary: Percutaneous cholecystostomy is unchanged with its retaining loop seen within the gallbladder lumen. The gallbladder, however, remains distended suggesting inadequate drainage of gallbladder contents and/or occlusion of the catheter. Previously noted hyperdensity related to hemorrhagic intraluminal  contents is not as well appreciated on this examination possibly related to partial lysis of a blood product. Layering gallstones are again identified within the gallbladder lumen. Liver otherwise unremarkable. No intra or  extrahepatic biliary ductal dilation. Pancreas: Unremarkable Spleen: Unremarkable Adrenals/Urinary Tract: 2.1 x 2.5 cm heterogeneously enhancing left adrenal nodule is again identified and appears stable since remote prior examination of 11/26/2019, though is indeterminate. The right adrenal gland is unremarkable. Moderate bilateral renal cortical atrophy again noted. No enhancing intrarenal masses are identified. No hydronephrosis. No intrarenal or ureteral calculi. Vascular calcifications are noted within the renal hila bilaterally. The bladder is decompressed with a Foley catheter balloon seen within its lumen. Stomach/Bowel: Stomach is within normal limits. Appendix appears normal. No evidence of bowel wall thickening, distention, or inflammatory changes. Vascular/Lymphatic: Extensive aortoiliac atherosclerotic calcification with probable hemodynamically significant stenoses involving the lower extremity arterial inflow bilaterally. This is unchanged and not optimally assessed on this non arteriographic examination. No aortic aneurysm. No pathologic adenopathy within the abdomen and pelvis. Reproductive: Brachytherapy seeds are seen within the prostate gland Other: There is progressive moderate diffuse subcutaneous body wall edema, particularly within the right flank which may relate to dependent positioning or superimposed local trauma or inflammation. Small fat containing left inguinal hernia. Rectum unremarkable. Musculoskeletal: No acute bone abnormality. No lytic or blastic bone lesion. IMPRESSION: Stable examination with percutaneous cholecystostomy in place but persistent distension of the gallbladder suggesting inadequate drainage of complex intraluminal contents and/or catheter occlusion. Evolution of intraluminal blood product within the gallbladder lumen noted. Layering gallstones again identified. Progressive anasarca with enlarging pleural effusions and progressive diffuse body wall subcutaneous  edema. Asymmetric right flank subcutaneous body wall edema which may relate to dependent positioning or superimposed local trauma or inflammation. Correlation with clinical examination is recommended. 2.5 cm heterogeneously enhancing left adrenal nodule. While stable since remote prior examination of 11/26/2019, a malignant lesion such as a pheochromocytoma, is not excluded and dedicated adrenal mass protocol CT or MRI examination is recommended once the patient's acute issues have resolved. Peripheral vascular disease with suspected bilateral lower extremity arterial inflow stenoses. This would be better assessed with CT arteriography if there is a clinical history of a lower extremity claudication or arterial vascular insufficiency. Aortic Atherosclerosis (ICD10-I70.0). Electronically Signed   By: Fidela Salisbury M.D.   On: 04/07/2021 19:22   CT ABDOMEN PELVIS W CONTRAST  Result Date: 04/02/2021 CLINICAL DATA:  Acute abdominal pain.  Pneumonia. EXAM: CT CHEST, ABDOMEN, AND PELVIS WITH CONTRAST TECHNIQUE: Multidetector CT imaging of the chest, abdomen and pelvis was performed following the standard protocol during bolus administration of intravenous contrast. RADIATION DOSE REDUCTION: This exam was performed according to the departmental dose-optimization program which includes automated exposure control, adjustment of the mA and/or kV according to patient size and/or use of iterative reconstruction technique. CONTRAST:  112mL OMNIPAQUE IOHEXOL 350 MG/ML SOLN COMPARISON:  CT abdomen and pelvis 03/31/2021. CT angiogram chest abdomen and pelvis 12/01/2019. FINDINGS: CT CHEST FINDINGS Cardiovascular: The heart is mildly enlarged. There is no pericardial effusion. Aorta is normal in size. Patient is status post TAVR. There are atherosclerotic calcifications of the aorta and coronary arteries. Mediastinum/Nodes: No enlarged mediastinal, hilar, or axillary lymph nodes. Thyroid gland, trachea, and esophagus demonstrate  no significant findings. Lungs/Pleura: There are small bilateral pleural effusions, right greater than left. Right pleural effusion has decreased from the prior examination. Right pleural effusion is likely partially loculated posteriorly. There is also small amount of loculated fluid in the right major  fissure. There are patchy ground-glass and airspace opacities in the right upper lobe and right lower lobe. There is atelectasis in the left lower lobe. There is no evidence for pneumothorax. There is some secretions in the trachea. Trachea and central airways appear patent. There is a stable 3 mm nodule in the left upper lobe image 4/52. No new pulmonary nodules are identified. Musculoskeletal: No acute fractures. Degenerative changes affect the spine. CT ABDOMEN PELVIS FINDINGS Hepatobiliary: Percutaneous cholecystostomy tube is again seen. The gallbladder is dilated similar to the prior study. There is a small amount of air in the gallbladder. Layering small stones are unchanged. Heterogeneous hyperdensity throughout the gallbladder likely represents hemorrhagic products, also unchanged. There is no biliary ductal dilatation. No focal liver lesions are seen. Pancreas: Unremarkable. No pancreatic ductal dilatation or surrounding inflammatory changes. Spleen: Normal in size without focal abnormality. Adrenals/Urinary Tract: 2.5 cm left adrenal nodule is indeterminate and unchanged. Right adrenal gland is within normal limits. The bladder is decompressed by Foley catheter. There is no hydronephrosis. There is a cortical hypodensity in the superior pole the left kidney which is too small to characterize, likely cysts. Stomach/Bowel: Appendix appears normal. No evidence of bowel wall thickening, distention, or inflammatory changes. There is scattered air-fluid levels throughout the colon. The stomach is completely decompressed and gastric wall thickening can not be excluded. Vascular/Lymphatic: Aortic atherosclerosis.  No enlarged abdominal or pelvic lymph nodes. Reproductive: Prostate radiotherapy seeds are present. Other: There is a small fat containing umbilical hernia. There is trace ascites, decreased from prior. There is mild body wall edema which is similar to the prior study. Musculoskeletal: No acute or significant osseous findings. IMPRESSION: 1. Small right pleural effusion has decreased in size. This effusion is partially loculated posteriorly and within the major fissure. Loculation may be secondary to chronicity, hemorrhage or malignancy. 2. Patchy ground-glass and airspace opacities in the right upper lobe and right lower lobe most consistent with pneumonia. 3. Stable small left pleural effusion. 4. The gallbladder is dilated. Cholecystostomy tube is in place. Gallbladder air, hemorrhagic products and gallstones are present. Appearance is unchanged from the prior study. 5. Stable body wall edema and trace ascites. 6. Stable indeterminate left adrenal nodule. 7. Stable 3 mm left upper lobe pulmonary nodule. No follow-up needed if patient is low-risk. Non-contrast chest CT can be considered in 12 months if patient is high-risk. This recommendation follows the consensus statement: Guidelines for Management of Incidental Pulmonary Nodules Detected on CT Images: From the Fleischner Society 2017; Radiology 2017; 284:228-243. Electronically Signed   By: Ronney Asters M.D.   On: 04/02/2021 21:11   IR Perc Cholecystostomy  Result Date: 03/28/2021 INDICATION: 81 year old male referred for percutaneous cholecystostomy EXAM: CHOLECYSTOSTOMY MEDICATIONS: 2 g Mefoxin; The antibiotic was administered within an appropriate time frame prior to the initiation of the procedure. ANESTHESIA/SEDATION: Moderate (conscious) sedation was employed during this procedure. A total of Versed 1.0 mg and Fentanyl 25 mcg was administered intravenously. Moderate Sedation Time: 13 minutes. The patient's level of consciousness and vital signs were  monitored continuously by radiology nursing throughout the procedure under my direct supervision. FLUOROSCOPY TIME:  Fluoroscopy Time: 0 minutes 42 seconds (3 mGy). COMPLICATIONS: None PROCEDURE: Informed written consent was obtained from the patient and the patient's family after a thorough discussion of the procedural risks, benefits and alternatives. All questions were addressed. Maximal Sterile Barrier Technique was utilized including caps, mask, sterile gowns, sterile gloves, sterile drape, hand hygiene and skin antiseptic. A timeout was performed prior  to the initiation of the procedure. Ultrasound survey of the right upper quadrant was performed for planning purposes. Once the patient is prepped and draped in the usual sterile fashion, the skin and subcutaneous tissues overlying the gallbladder were generously infiltrated 1% lidocaine for local anesthesia. A coaxial needle was advanced under ultrasound guidance through the skin subcutaneous tissues and a small segment of liver into the gallbladder lumen. With removal of the stylet, spontaneous dark bile drainage occurred. Using modified Seldinger technique, a 10 French drain was placed into the gallbladder fossa, with aspiration of the sample for the lab. Contrast injection confirmed position of the tube within the gallbladder lumen. Drainage catheter was attached to gravity drain with a suture retention placed. Patient tolerated the procedure well and remained hemodynamically stable throughout. No complications were encountered and no significant blood loss encountered. IMPRESSION: Status post image guided percutaneous cholecystostomy. Signed, Dulcy Fanny. Dellia Nims, RPVI [facility] Rad[facility] Electronically Signed   By: Corrie Mckusick D.O.   On: 03/28/2021 17:26   DG Chest Portable 1 View  Result Date: 04/22/2021 CLINICAL DATA:  Status post fall with vomiting and weakness. EXAM: PORTABLE CHEST 1 VIEW  COMPARISON:  April 07, 2021 FINDINGS: Mild to moderate severity atelectasis and/or infiltrate is seen within the right lung base. There is a small, stable right pleural effusion. No pneumothorax is identified. The cardiac silhouette is mildly enlarged and unchanged in size. An artificial aortic valve is noted. A percutaneous biliary drainage catheter is seen overlying the right upper quadrant. Degenerative changes seen throughout the thoracic spine. IMPRESSION: 1. Mild to moderate severity right basilar atelectasis and/or infiltrate. 2. Small, stable right pleural effusion. Electronically Signed   By: Virgina Norfolk M.D.   On: 04/22/2021 19:19   DG Chest Port 1 View  Result Date: 04/07/2021 CLINICAL DATA:  Chest pain, cholecystostomy EXAM: PORTABLE CHEST 1 VIEW COMPARISON:  04/02/2021 chest radiograph. 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 Surgical clips overlie the lower right heart and left axilla. Partially visualized pigtail cholecystostomy tube in the right upper quadrant of the abdomen. Stable cardiomediastinal silhouette with borderline mild cardiomegaly. No pneumothorax. Slight blunting of the right costophrenic angle, unchanged, cannot exclude small right pleural effusion. No left pleural effusion. No overt pulmonary edema. Slightly low lung volumes. Patchy right lung base opacity, slightly improved. IMPRESSION: 1. Stable borderline mild cardiomegaly without overt pulmonary edema. 2. Possible small right pleural effusion, unchanged. 3. Slightly improved patchy right lung base opacity. Electronically Signed   By: Ilona Sorrel M.D.   On: 04/07/2021 13:08   DG CHEST PORT 1 VIEW  Result Date: 04/02/2021 CLINICAL DATA:  Shortness of breath.  Follow-up exam. EXAM: PORTABLE CHEST 1 VIEW COMPARISON:  03/24/2021 and older studies. FINDINGS: Patchy airspace opacity has increased at the right lung base, extending to the right mid lung. Mild linear atelectasis or scarring is noted at the medial left  lung base, stable. Remainder of the lungs is clear. Cardiac silhouette normal in size. Stable endovascular aortic valve replacement. No pneumothorax. Stable pigtail catheter in the right upper quadrant. IMPRESSION: 1. Interval increase in airspace opacity at the right lung base from the previous day's exam, which may reflect reaccumulation of fluid and associated atelectasis, with infection also in the differential diagnosis. 2. No other change.  No pneumothorax. Electronically Signed   By: Lajean Manes M.D.   On: 04/02/2021 08:30   DG Chest Port 1 View  Result Date: 04/01/2021 CLINICAL DATA:  Status post thoracentesis.  Right pleural effusion. EXAM: PORTABLE CHEST 1 VIEW COMPARISON:  03/27/2021. FINDINGS: There is decreased opacity at the right lung base with most of the right hemidiaphragm now defined. Residual opacity at the right base is consistent with atelectasis, likely with a small residual pleural effusion. Remainder of the lungs is clear. No pneumothorax. A pigtail catheter projects in the right upper quadrant. Cardiac silhouette is normal in size. IMPRESSION: 1. Decreased right lung base opacity following thoracentesis. Residual opacity consistent with atelectasis and a small effusion. 2. Remainder of the lungs is clear. 3. No pneumothorax. Electronically Signed   By: Lajean Manes M.D.   On: 04/01/2021 13:37   DG Abd Portable 1V  Result Date: 03/31/2021 CLINICAL DATA:  Emesis EXAM: PORTABLE ABDOMEN - 1 VIEW COMPARISON:  Abdominal radiograph dated March 29, 2021 FINDINGS: Nonobstructive bowel gas pattern. Surgical drain noted in the right upper quadrant. Partially visualized left lung bases clear. IMPRESSION: Nonobstructive bowel gas pattern. Electronically Signed   By: Yetta Glassman M.D.   On: 03/31/2021 10:49   DG Abd Portable 1V  Result Date: 03/29/2021 CLINICAL DATA:  Emesis for 2-3 days, history of cancer and diabetes in an 81 year old male. EXAM: PORTABLE ABDOMEN - 1 VIEW  COMPARISON:  CT of March 23, 2021. FINDINGS: A percutaneous cholecystostomy tube is partially visualized projecting over the RIGHT lower quadrant. RIGHT and LEFT hemidiaphragmatic contours are excluded from view. Imaging excludes the upper abdomen on today's study. There is no sign of bowel obstruction. Small amount of stool and gas present in the rectum. Brachytherapy seeds are present in the prostate. On limited assessment there is no acute regional skeletal process. IMPRESSION: 1. No sign of bowel obstruction. Imaging excludes the upper abdomen on today's study. 2. Cholecystostomy tube partially visualized projecting over the RIGHT lower quadrant. Electronically Signed   By: Zetta Bills M.D.   On: 03/29/2021 14:47   CT CHEST ABDOMEN PELVIS WO CONTRAST  Result Date: 04/22/2021 CLINICAL DATA:  81 year old male chest, abdominal and pelvic pain following fall. Currently on Plavix. Patient with percutaneous cholecystostomy tube. EXAM: CT CHEST, ABDOMEN AND PELVIS WITHOUT CONTRAST TECHNIQUE: Multidetector CT imaging of the chest, abdomen and pelvis was performed following the standard protocol without IV contrast. RADIATION DOSE REDUCTION: This exam was performed according to the departmental dose-optimization program which includes automated exposure control, adjustment of the mA and/or kV according to patient size and/or use of iterative reconstruction technique. COMPARISON:  04/27/2021 abdomen/pelvis CT, 04/02/2021 chest, abdomen and pelvic CT and prior studies FINDINGS: Please note that parenchymal and vascular abnormalities may be missed as intravenous contrast was not administered. CT CHEST FINDINGS Cardiovascular: Heart size is within normal limits. Aortic valve replacement and heavy coronary artery atherosclerotic calcifications again noted. Aortic atherosclerotic calcifications again identified without thoracic aortic aneurysm. No pericardial effusion. Mediastinum/Nodes: No mediastinal hematoma or  mass. No enlarged lymph nodes are identified. The thyroid gland, trachea and esophagus are unremarkable. Lungs/Pleura: A small to moderate RIGHT pleural effusion is stable to slightly increased from 04/07/2021. RIGHT LOWER lung atelectasis is again identified. There is no evidence of airspace disease, definite mass, suspicious nodule, consolidation, pneumothorax or LEFT pleural effusion. A 3 mm LEFT UPPER lobe nodule is unchanged 2021. Musculoskeletal: No acute or suspicious bony abnormalities are noted. CT ABDOMEN PELVIS FINDINGS Hepatobiliary: The liver is unremarkable. Cholecystostomy tube within the gallbladder is again noted. Cholelithiasis again identified. No evidence of biliary dilatation. Pancreas: Unremarkable Spleen: Unremarkable Adrenals/Urinary Tract: Moderate bilateral hydroureteronephrosis identified to the bladder without obstructing cause identified.  Mild bladder distention is noted. A 2.7 cm LEFT adrenal mass is unchanged from 11/26/2019. The RIGHT adrenal gland is unremarkable. Stomach/Bowel: Mild apparent circumferential rectal wall thickening is noted and may represent proctitis. There is no evidence of bowel obstruction or other bowel wall thickening. No other inflammatory changes are noted. The appendix is normal. Vascular/Lymphatic: Aortic atherosclerosis. No enlarged abdominal or pelvic lymph nodes. Reproductive: Prostate implants are present. Other: No ascites, focal collection or pneumoperitoneum. Musculoskeletal: No acute or suspicious bony abnormalities are noted. IMPRESSION: 1. No evidence of acute injury to the chest, abdomen or pelvis. 2. Mild apparent circumferential rectal wall thickening which may represent proctitis. Correlate clinically. 3. Moderate bilateral hydroureteronephrosis to the bladder without obstructing cause identified. This may be related to bladder distention/outlet obstruction. 4. Unchanged to slightly increased small to moderate RIGHT pleural effusion and RIGHT  LOWER lung atelectasis. 5. Cholelithiasis and cholecystostomy tube again noted. 6. Unchanged LEFT adrenal mass. 7. 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 8. Aortic Atherosclerosis (ICD10-I70.0). Electronically Signed   By: Margarette Canada M.D.   On: 04/22/2021 20:21   US Abdomen Limited RUQ (LIVER/GB)  Result Date: 03/27/2021 CLINICAL DATA:  Abdominal pain. EXAM: ULTRASOUND ABDOMEN LIMITED RIGHT UPPER QUADRANT COMPARISON:  CT abdomen and pelvis 03/23/2021 FINDINGS: Gallbladder: Distended gallbladder containing debris/sludge and tiny stones. Pericholecystic fluid. No generalized wall thickening. A positive sonographic Percell Miller sign was noted by the sonographer. Common bile duct: Diameter: 5 mm Liver: Suboptimally evaluated due to bowel gas. No gross mass identified. Portal vein is patent on color Doppler imaging with normal direction of blood flow towards the liver. Other: Right pleural effusion. IMPRESSION: Distended gallbladder containing debris/sludge and tiny stones with pericholecystic fluid and positive sonographic Murphy sign concerning for acute cholecystitis. Electronically Signed   By: Logan Bores M.D.   On: 03/27/2021 15:21   US THORACENTESIS ASP PLEURAL SPACE W/IMG GUIDE  Result Date: 04/01/2021 INDICATION: Patient with history of end-stage renal disease, AFib, acute cholecystitis status post percutaneous cholecystostomy placement now with worsening bilateral pleural effusions. Request to IR for diagnostic and therapeutic right thoracentesis EXAM: ULTRASOUND GUIDED RIGHT THORACENTESIS MEDICATIONS: 7 mL 1% lidocaine COMPLICATIONS: None immediate. PROCEDURE: An ultrasound guided thoracentesis was thoroughly discussed with the patient and questions answered. The benefits, risks, alternatives and complications were also discussed. The patient understands and wishes to proceed with the procedure. Written consent was obtained. Ultrasound was performed to localize and mark an adequate pocket of  fluid in the RIGHT chest. The area was then prepped and draped in the normal sterile fashion. 1% Lidocaine was used for local anesthesia. Under ultrasound guidance a 6 Fr Safe-T-Centesis catheter was introduced. Thoracentesis was performed. The catheter was removed and a dressing applied. FINDINGS: A total of approximately 1.1 L of dark red fluid was removed. Samples were sent to the laboratory as requested by the clinical team. IMPRESSION: Successful ultrasound guided RIGHT thoracentesis yielding 1.1 L of pleural fluid. Read by Candiss Norse, PA-C Electronically Signed   By: Michaelle Birks M.D.   On: 04/01/2021 19:20     Subjective: Patient seen examined bedside, resting comfortably.  No specific complaints this morning.  WBC count much improved and close to normal limits.  Discharging back to [facility] today.  No other questions or concerns at this time.  Denies any dysphagia, cough with taking medications or diet.  Denies headache, no chest pain, no shortness of breath, no abdominal pain, no fever/chills/night sweats, no nausea/vomiting/diarrhea, no paresthesias.  No acute events overnight per nursing  staff.  Discharge Exam: Vitals:   04/25/21 0350 04/25/21 0603  BP: 130/69   Pulse: 75 78  Resp: 19 14  Temp:    SpO2: 99% 98%   Vitals:   04/24/21 2027 04/24/21 2328 04/25/21 0350 04/25/21 0603  BP: (!) 150/93 (!) 110/56 130/69   Pulse: (!) 30 86 75 78  Resp: (!) 21 (!) $Remo'22 19 14  'jaucz$ Temp: 98.2 F (36.8 C) 98.2 F (36.8 C)    TempSrc: Oral Oral    SpO2: 96% 99% 99% 98%  Weight:    66.5 kg  Height:        Physical Exam: GEN: NAD, alert and oriented x 3, chronically ill in appearance HEENT: NCAT, PERRL, EOMI, sclera clear, MMM PULM: CTAB w/o wheezes/crackles, normal respiratory effort, on room air CV: RRR w/o M/G/R GI: abd soft, NTND, NABS, no R/G/M, percutaneous cholecystostomy drain noted with bilious/dark drainage noted in collection bag MSK: no peripheral edema, muscle strength globally  intact 5/5 bilateral upper/lower extremities NEURO: CN II-XII intact, no focal deficits, sensation to light touch intact PSYCH: normal mood/affect Integumentary: Stage III coccyx wound noted    The results of significant diagnostics from this hospitalization (including imaging, microbiology, ancillary and laboratory) are listed below for reference.     Microbiology: Recent Results (from the past 240 hour(s))  Resp Panel by RT-PCR (Flu A&B, Covid) Nasopharyngeal Swab     Status: None   Collection Time: 04/22/21  7:10 PM   Specimen: Nasopharyngeal Swab; Nasopharyngeal(NP) swabs in vial transport medium  Result Value Ref Range Status   SARS Coronavirus 2 by RT PCR NEGATIVE NEGATIVE Final    Comment: (NOTE) SARS-CoV-2 target nucleic acids are NOT DETECTED.  The SARS-CoV-2 RNA is generally detectable in upper respiratory specimens during the acute phase of infection. The lowest concentration of SARS-CoV-2 viral copies this assay can detect is 138 copies/mL. A negative result does not preclude SARS-Cov-2 infection and should not be used as the sole basis for treatment or other patient management decisions. A negative result may occur with  improper specimen collection/handling, submission of specimen other than nasopharyngeal swab, presence of viral mutation(s) within the areas targeted by this assay, and inadequate number of viral copies(<138 copies/mL). A negative result must be combined with clinical observations, patient history, and epidemiological information. The expected result is Negative.  Fact Sheet for Patients:  EntrepreneurPulse.com.au  Fact Sheet for Healthcare Providers:  IncredibleEmployment.be  This test is no t yet approved or cleared by the [place] FDA and  has been authorized for detection and/or diagnosis of SARS-CoV-2 by FDA under an Emergency Use Authorization (EUA). This EUA will remain  in effect (meaning this test  can be used) for the duration of the COVID-19 declaration under Section 564(b)(1) of the Act, 21 U.S.C.section 360bbb-3(b)(1), unless the authorization is terminated  or revoked sooner.       Influenza A by PCR NEGATIVE NEGATIVE Final   Influenza B by PCR NEGATIVE NEGATIVE Final    Comment: (NOTE) The Xpert Xpress SARS-CoV-2/FLU/RSV plus assay is intended as an aid in the diagnosis of influenza from Nasopharyngeal swab specimens and should not be used as a sole basis for treatment. Nasal washings and aspirates are unacceptable for Xpert Xpress SARS-CoV-2/FLU/RSV testing.  Fact Sheet for Patients: EntrepreneurPulse.com.au  Fact Sheet for Healthcare Providers: IncredibleEmployment.be  This test is not yet approved or cleared by the [place] FDA and has been authorized for detection and/or diagnosis of SARS-CoV-2 by FDA under an Emergency Use  Authorization (EUA). This EUA will remain in effect (meaning this test can be used) for the duration of the COVID-19 declaration under Section 564(b)(1) of the Act, 21 U.S.C. section 360bbb-3(b)(1), unless the authorization is terminated or revoked.  Performed at [facility], [place] [address] [place], [place] [address]   Resp Panel by RT-PCR (Flu A&B, Covid) Nasopharyngeal Swab     Status: None   Collection Time: 04/25/21  9:37 AM   Specimen: Nasopharyngeal Swab; Nasopharyngeal(NP) swabs in vial transport medium  Result Value Ref Range Status   SARS Coronavirus 2 by RT PCR NEGATIVE NEGATIVE Final    Comment: (NOTE) SARS-CoV-2 target nucleic acids are NOT DETECTED.  The SARS-CoV-2 RNA is generally detectable in upper respiratory specimens during the acute phase of infection. The lowest concentration of SARS-CoV-2 viral copies this assay can detect is 138 copies/mL. A negative result does not preclude SARS-Cov-2 infection and should not be used as the sole basis for treatment or other patient  management decisions. A negative result may occur with  improper specimen collection/handling, submission of specimen other than nasopharyngeal swab, presence of viral mutation(s) within the areas targeted by this assay, and inadequate number of viral copies(<138 copies/mL). A negative result must be combined with clinical observations, patient history, and epidemiological information. The expected result is Negative.  Fact Sheet for Patients:  EntrepreneurPulse.com.au  Fact Sheet for Healthcare Providers:  IncredibleEmployment.be  This test is no t yet approved or cleared by the [place] FDA and  has been authorized for detection and/or diagnosis of SARS-CoV-2 by FDA under an Emergency Use Authorization (EUA). This EUA will remain  in effect (meaning this test can be used) for the duration of the COVID-19 declaration under Section 564(b)(1) of the Act, 21 U.S.C.section 360bbb-3(b)(1), unless the authorization is terminated  or revoked sooner.       Influenza A by PCR NEGATIVE NEGATIVE Final   Influenza B by PCR NEGATIVE NEGATIVE Final    Comment: (NOTE) The Xpert Xpress SARS-CoV-2/FLU/RSV plus assay is intended as an aid in the diagnosis of influenza from Nasopharyngeal swab specimens and should not be used as a sole basis for treatment. Nasal washings and aspirates are unacceptable for Xpert Xpress SARS-CoV-2/FLU/RSV testing.  Fact Sheet for Patients: EntrepreneurPulse.com.au  Fact Sheet for Healthcare Providers: IncredibleEmployment.be  This test is not yet approved or cleared by the [place] FDA and has been authorized for detection and/or diagnosis of SARS-CoV-2 by FDA under an Emergency Use Authorization (EUA). This EUA will remain in effect (meaning this test can be used) for the duration of the COVID-19 declaration under Section 564(b)(1) of the Act, 21 U.S.C. section 360bbb-3(b)(1),  unless the authorization is terminated or revoked.  Performed at [facility], [place] [address] [place], [place] [address]      Labs: BNP (last 3 results) No results for input(s): BNP in the last 8760 hours. Basic Metabolic Panel: Recent Labs  Lab 04/22/21 1920 04/23/21 0315 04/24/21 0155 04/25/21 0449  NA 138 139 139 138  K 4.3 4.0 4.1 4.0  CL 96* 99 100 101  CO2 $Re'28 30 28 27  'Nev$ GLUCOSE 190* 152* 151* 91  BUN 24* 26* 30* 17  CREATININE 5.56* 5.78* 6.48* 4.68*  CALCIUM 9.0 8.6* 8.6* 8.2*  PHOS  --   --  1.9* 1.9*   Liver Function Tests: Recent Labs  Lab 04/22/21 1920 04/24/21 0155 04/25/21 0449  AST 27  --   --   ALT 12  --   --  ALKPHOS 169*  --   --   BILITOT 0.5  --   --   PROT 7.3  --   --   ALBUMIN 2.7* 2.0* 2.0*   Recent Labs  Lab 04/22/21 1920  LIPASE 52*   No results for input(s): AMMONIA in the last 168 hours. CBC: Recent Labs  Lab 04/22/21 1920 04/23/21 0315 04/24/21 0155 04/25/21 0449  WBC 20.1* 14.9* 16.0* 11.5*  NEUTROABS 16.5*  --   --   --   HGB 10.5* 8.4* 8.5* 7.9*  HCT 33.1* 25.9* 27.1* 25.3*  MCV 108.2* 105.7* 105.9* 105.9*  PLT 382 313 317 277   Cardiac Enzymes: No results for input(s): CKTOTAL, CKMB, CKMBINDEX, TROPONINI in the last 168 hours. BNP: Invalid input(s): POCBNP CBG: Recent Labs  Lab 04/23/21 2031 04/24/21 0736 04/24/21 1139 04/24/21 1832 04/24/21 2044  GLUCAP 148* 84 116* 145* 158*   D-Dimer No results for input(s): DDIMER in the last 72 hours. Hgb A1c No results for input(s): HGBA1C in the last 72 hours. Lipid Profile No results for input(s): CHOL, HDL, LDLCALC, TRIG, CHOLHDL, LDLDIRECT in the last 72 hours. Thyroid function studies No results for input(s): TSH, T4TOTAL, T3FREE, THYROIDAB in the last 72 hours.  Invalid input(s): FREET3 Anemia work up No results for input(s): VITAMINB12, FOLATE, FERRITIN, TIBC, IRON, RETICCTPCT in the last 72 hours. Urinalysis    Component Value Date/Time    COLORURINE YELLOW 03/27/2021 1130   APPEARANCEUR HAZY (A) 03/27/2021 1130   LABSPEC 1.008 03/27/2021 1130   PHURINE 8.0 03/27/2021 1130   GLUCOSEU 50 (A) 03/27/2021 1130   HGBUR MODERATE (A) 03/27/2021 1130   BILIRUBINUR NEGATIVE 03/27/2021 1130   BILIRUBINUR negative 02/19/2018 1643   KETONESUR NEGATIVE 03/27/2021 1130   PROTEINUR 100 (A) 03/27/2021 1130   UROBILINOGEN 0.2 02/19/2018 1643   UROBILINOGEN 1.0 08/22/2012 1402   NITRITE NEGATIVE 03/27/2021 1130   LEUKOCYTESUR LARGE (A) 03/27/2021 1130   Sepsis Labs Invalid input(s): PROCALCITONIN,  WBC,  LACTICIDVEN Microbiology Recent Results (from the past 240 hour(s))  Resp Panel by RT-PCR (Flu A&B, Covid) Nasopharyngeal Swab     Status: None   Collection Time: 04/22/21  7:10 PM   Specimen: Nasopharyngeal Swab; Nasopharyngeal(NP) swabs in vial transport medium  Result Value Ref Range Status   SARS Coronavirus 2 by RT PCR NEGATIVE NEGATIVE Final    Comment: (NOTE) SARS-CoV-2 target nucleic acids are NOT DETECTED.  The SARS-CoV-2 RNA is generally detectable in upper respiratory specimens during the acute phase of infection. The lowest concentration of SARS-CoV-2 viral copies this assay can detect is 138 copies/mL. A negative result does not preclude SARS-Cov-2 infection and should not be used as the sole basis for treatment or other patient management decisions. A negative result may occur with  improper specimen collection/handling, submission of specimen other than nasopharyngeal swab, presence of viral mutation(s) within the areas targeted by this assay, and inadequate number of viral copies(<138 copies/mL). A negative result must be combined with clinical observations, patient history, and epidemiological information. The expected result is Negative.  Fact Sheet for Patients:  EntrepreneurPulse.com.au  Fact Sheet for Healthcare Providers:  IncredibleEmployment.be  This test is no t  yet approved or cleared by the [place] FDA and  has been authorized for detection and/or diagnosis of SARS-CoV-2 by FDA under an Emergency Use Authorization (EUA). This EUA will remain  in effect (meaning this test can be used) for the duration of the COVID-19 declaration under Section 564(b)(1) of the Act, 21 U.S.C.section 360bbb-3(b)(1),  unless the authorization is terminated  or revoked sooner.       Influenza A by PCR NEGATIVE NEGATIVE Final   Influenza B by PCR NEGATIVE NEGATIVE Final    Comment: (NOTE) The Xpert Xpress SARS-CoV-2/FLU/RSV plus assay is intended as an aid in the diagnosis of influenza from Nasopharyngeal swab specimens and should not be used as a sole basis for treatment. Nasal washings and aspirates are unacceptable for Xpert Xpress SARS-CoV-2/FLU/RSV testing.  Fact Sheet for Patients: EntrepreneurPulse.com.au  Fact Sheet for Healthcare Providers: IncredibleEmployment.be  This test is not yet approved or cleared by the [place] FDA and has been authorized for detection and/or diagnosis of SARS-CoV-2 by FDA under an Emergency Use Authorization (EUA). This EUA will remain in effect (meaning this test can be used) for the duration of the COVID-19 declaration under Section 564(b)(1) of the Act, 21 U.S.C. section 360bbb-3(b)(1), unless the authorization is terminated or revoked.  Performed at [facility], [place] [address], [place], [place] [address]   Resp Panel by RT-PCR (Flu A&B, Covid) Nasopharyngeal Swab     Status: None   Collection Time: 04/25/21  9:37 AM   Specimen: Nasopharyngeal Swab; Nasopharyngeal(NP) swabs in vial transport medium  Result Value Ref Range Status   SARS Coronavirus 2 by RT PCR NEGATIVE NEGATIVE Final    Comment: (NOTE) SARS-CoV-2 target nucleic acids are NOT DETECTED.  The SARS-CoV-2 RNA is generally detectable in upper respiratory specimens during the acute phase of infection.  The lowest concentration of SARS-CoV-2 viral copies this assay can detect is 138 copies/mL. A negative result does not preclude SARS-Cov-2 infection and should not be used as the sole basis for treatment or other patient management decisions. A negative result may occur with  improper specimen collection/handling, submission of specimen other than nasopharyngeal swab, presence of viral mutation(s) within the areas targeted by this assay, and inadequate number of viral copies(<138 copies/mL). A negative result must be combined with clinical observations, patient history, and epidemiological information. The expected result is Negative.  Fact Sheet for Patients:  EntrepreneurPulse.com.au  Fact Sheet for Healthcare Providers:  IncredibleEmployment.be  This test is no t yet approved or cleared by the [place] FDA and  has been authorized for detection and/or diagnosis of SARS-CoV-2 by FDA under an Emergency Use Authorization (EUA). This EUA will remain  in effect (meaning this test can be used) for the duration of the COVID-19 declaration under Section 564(b)(1) of the Act, 21 U.S.C.section 360bbb-3(b)(1), unless the authorization is terminated  or revoked sooner.       Influenza A by PCR NEGATIVE NEGATIVE Final   Influenza B by PCR NEGATIVE NEGATIVE Final    Comment: (NOTE) The Xpert Xpress SARS-CoV-2/FLU/RSV plus assay is intended as an aid in the diagnosis of influenza from Nasopharyngeal swab specimens and should not be used as a sole basis for treatment. Nasal washings and aspirates are unacceptable for Xpert Xpress SARS-CoV-2/FLU/RSV testing.  Fact Sheet for Patients: EntrepreneurPulse.com.au  Fact Sheet for Healthcare Providers: IncredibleEmployment.be  This test is not yet approved or cleared by the [place] FDA and has been authorized for detection and/or diagnosis of SARS-CoV-2 by FDA  under an Emergency Use Authorization (EUA). This EUA will remain in effect (meaning this test can be used) for the duration of the COVID-19 declaration under Section 564(b)(1) of the Act, 21 U.S.C. section 360bbb-3(b)(1), unless the authorization is terminated or revoked.  Performed at [facility], [place] [address] [place], [place] [address]  Time coordinating discharge: Over 30 minutes  SIGNED:   Donnamarie Poag [place] DO  [facility] 04/25/2021, 10:42 AM

## 2021-04-26 NOTE — Progress Notes (Unsigned)
Cardiology Office Note:   Date:  04/26/2021  NAME:  Elijah White    MRN: [id number] DOB:  31-Mar-1940   PCP:  Maryella Shivers, MD  Cardiologist:  Evalina Field, MD  Electrophysiologist:  None   Referring MD: Maryella Shivers, MD   No chief complaint on file. ***  History of Present Illness:   Elijah White is a 81 y.o. male with a hx of severe AS s/p TAVR, pAF, ESRD, PAD, DM, HTN, HLD who presents for follow-up.   Admit 03/15/2021-03/26/2021 -> acute cholecystitis, drain, Afib. Converted back to NSR. No AC due to drop in HGB and concerns for GIB. Found to have gastritis on EGD.   Re-admit 03/27/2021-04/05/2021 ->dislodged chol drain, PNA  Re-admit 04/07/2021-04/13/2021 -> Ab pain, hypotension.   Re-admit 04/22/2021-04/25/2021 -> intractable N/V. PNA.   Problem List 1. ESRD on HD 2. HTN 3. DM2 -A1c 7.1 4. HFpEF 5. 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  Hyperlipidemia -Total cholesterol 213, HDL 63, LDL 103, triglycerides 91 8. Blindness  9. 3 mm Left upper lobe pulmonary nodule  -needs CT non-con 11/2020 (former smoker) 10. Paroxysmal Afib -Dx 03/2021 -> Acute cholecystitis  -No AC due to concerns for GI bleed -> EGD gastritis  11. PAD -L BKA 12. Parkinson's   Past Medical History: Past Medical History:  Diagnosis Date   Anemia    low iron   Aortic stenosis    s/p TAVR 01/19/20   Arthritis    Cancer ([place])    prostate, s/p I-125 seed implant 05/18/05   Colon polyps ~ 1993 and 2003   Dr [name], [facility].  08/2001 colonoscopy: tubular adenoma at cecum.     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   Elevated cholesterol with high triglycerides    ESRD (end stage renal disease) (HCC)    [facility]   GERD (gastroesophageal reflux disease)    Glaucoma    Hypertension    Legally blind in left eye, as defined in [place]    has pinpoint vision in right eye   PAD  (peripheral artery disease) ([place])    RBBB     Past Surgical History: Past Surgical History:  Procedure Laterality Date   ABDOMINAL AORTOGRAM W/LOWER EXTREMITY N/A 07/25/2020   Procedure: ABDOMINAL AORTOGRAM W/LOWER EXTREMITY;  Surgeon: Marty Heck, MD;  Location: [facility];  Service: Cardiovascular;  Laterality: N/A;   AMPUTATION Left 08/12/2020   Procedure: LEFT BELOW KNEE AMPUTATION;  Surgeon: Marty Heck, MD;  Location: [facility];  Service: Vascular;  Laterality: Left;   AV FISTULA PLACEMENT Left 03/20/2018   Procedure: ARTERIOVENOUS (AV) FISTULA CREATION ARM;  Surgeon: [name], MD;  Location: [facility];  Service: Vascular;  Laterality: Left;   [place] Left 05/21/2018   Procedure: LEFT BASILIC VEIN FISTULA SECOND STAGE;  Surgeon: [name], MD;  Location: [facility];  Service: Vascular;  Laterality: Left;   BIOPSY  03/25/2021   Procedure: BIOPSY;  Surgeon: Otis Brace, MD;  Location: [facility];  Service: Gastroenterology;;   COLONOSCOPY     COLONOSCOPY WITH PROPOFOL N/A 11/30/2019   Procedure: COLONOSCOPY WITH PROPOFOL;  Surgeon: Ronnette Juniper, MD;  Location: [facility];  Service: Gastroenterology;  Laterality: N/A;   ESOPHAGOGASTRODUODENOSCOPY (EGD) WITH PROPOFOL N/A 03/25/2021   Procedure: ESOPHAGOGASTRODUODENOSCOPY (EGD) WITH PROPOFOL;  Surgeon: Otis Brace, MD;  Location: [facility];  Service: Gastroenterology;  Laterality: N/A;   GLAUCOMA SURGERY  2019   multiple surgeries   HEMOSTASIS CLIP PLACEMENT  11/30/2019   Procedure: HEMOSTASIS CLIP PLACEMENT;  Surgeon: Ronnette Juniper, MD;  Location: [facility];  Service: Gastroenterology;;   IR CHOLANGIOGRAM EXISTING TUBE  03/21/2021   IR PERC CHOLECYSTOSTOMY  03/16/2021   IR PERC CHOLECYSTOSTOMY  03/28/2021   IR REMOVAL BILIARY DRAIN  03/21/2021   KNEE ARTHROSCOPY     MULTIPLE EXTRACTIONS WITH ALVEOLOPLASTY N/A 12/30/2019   Procedure: MULTIPLE EXTRACTION WITH  ALVEOLOPLASTY;  Surgeon: Charlaine Dalton, DMD;  Location: [facility];  Service: Dentistry;  Laterality: N/A;   PERIPHERAL VASCULAR INTERVENTION Left 07/25/2020   Procedure: PERIPHERAL VASCULAR INTERVENTION;  Surgeon: Marty Heck, MD;  Location: [facility];  Service: Cardiovascular;  Laterality: Left;  superficial femoral   POLYPECTOMY  11/30/2019   Procedure: POLYPECTOMY;  Surgeon: Ronnette Juniper, MD;  Location: [facility];  Service: Gastroenterology;;   PROSTATE SURGERY     RIGHT/LEFT HEART CATH AND CORONARY ANGIOGRAPHY N/A 12/03/2019   Procedure: RIGHT/LEFT HEART CATH AND CORONARY ANGIOGRAPHY;  Surgeon: Burnell Blanks, MD;  Location: [facility];  Service: Cardiovascular;  Laterality: N/A;   SUBMUCOSAL TATTOO INJECTION  11/30/2019   Procedure: SUBMUCOSAL TATTOO INJECTION;  Surgeon: Ronnette Juniper, MD;  Location: [facility];  Service: Gastroenterology;;   TEE WITHOUT CARDIOVERSION N/A 01/19/2020   Procedure: TRANSESOPHAGEAL ECHOCARDIOGRAM (TEE);  Surgeon: Burnell Blanks, MD;  Location: [facility];  Service: Open Heart Surgery;  Laterality: N/A;   TRANSCATHETER AORTIC VALVE REPLACEMENT, TRANSFEMORAL Left 01/19/2020   Procedure: TRANSCATHETER AORTIC VALVE REPLACEMENT, LEFT TRANSFEMORAL;  Surgeon: Burnell Blanks, MD;  Location: [facility];  Service: Open Heart Surgery;  Laterality: Left;    Current Medications: No outpatient medications have been marked as taking for the 04/27/21 encounter (Appointment) with O'Neal, Cassie Freer, MD.     Allergies:    Patient has no known allergies.   Social History: Social History   Socioeconomic History   Marital status: Widowed    Spouse name: Not on file   Number of children: Not on file   Years of education: Not on file   Highest education level: Not on file  Occupational History   Occupation: retired  Tobacco Use   Smoking status: Former    Packs/day: 0.50    Types: Cigarettes    Quit  date: 03/23/2018    Years since quitting: 3.0   Smokeless tobacco: Never  Vaping Use   Vaping Use: Never used  Substance and Sexual Activity   Alcohol use: No   Drug use: No   Sexual activity: Not Currently  Other Topics Concern   Not on file  Social History Narrative   Right handed    Facility   Social Determinants of Health   Financial Resource Strain: Low Risk    Difficulty of Paying Living Expenses: Not hard at all  Food Insecurity: No Food Insecurity   Worried About Charity fundraiser in the Last Year: Never true   [place] in the Last Year: Never true  Transportation Needs: No Transportation Needs   Lack of Transportation (Medical): No   Lack of Transportation (Non-Medical): No  Physical Activity: Inactive   Days of Exercise per Week: 0 days   Minutes of Exercise per Session: 0 min  Stress: No Stress Concern Present   Feeling of Stress : Not at all  Social  Connections: Not on file     Family History: The patient's ***family history includes CAD in his mother.  ROS:   All other ROS reviewed and negative. Pertinent positives noted in the HPI.     EKGs/Labs/Other Studies Reviewed:   The following studies were personally reviewed by me today:  EKG:  EKG is *** ordered today.  The ekg ordered today demonstrates ***, and was personally reviewed by me.   Recent Labs: 03/22/2021: TSH 1.311 04/22/2021: ALT 12 04/25/2021: BUN 17; Creatinine, Ser 4.68; Hemoglobin 7.9; Platelets 277; Potassium 4.0; Sodium 138   Recent Lipid Panel    Component Value Date/Time   LDLCALC 103 09/11/2019 0000    Physical Exam:   VS:  There were no vitals taken for this visit.   Wt Readings from Last 3 Encounters:  04/25/21 146 lb 9.7 oz (66.5 kg)  04/13/21 149 lb 7.6 oz (67.8 kg)  04/05/21 145 lb 15.1 oz (66.2 kg)    General: Well nourished, well developed, in no acute distress Head: Atraumatic, normal size  Eyes: PEERLA, EOMI  Neck: Supple, no JVD Endocrine: No  thryomegaly Cardiac: Normal S1, S2; RRR; no murmurs, rubs, or gallops Lungs: Clear to auscultation bilaterally, no wheezing, rhonchi or rales  Abd: Soft, nontender, no hepatomegaly  Ext: No edema, pulses 2+ Musculoskeletal: No deformities, BUE and BLE strength normal and equal Skin: Warm and dry, no rashes   Neuro: Alert and oriented to person, place, time, and situation, CNII-XII grossly intact, no focal deficits  Psych: Normal mood and affect   ASSESSMENT:   Elijah White is a 81 y.o. male who presents for the following: No diagnosis found.  PLAN:   There are no diagnoses linked to this encounter.  {Are you ordering a CV Procedure (e.g. stress test, cath, DCCV, TEE, etc)?   Press F2        :[id number]}  Disposition: No follow-ups on file.  Medication Adjustments/Labs and Tests Ordered: Current medicines are reviewed at length with the patient today.  Concerns regarding medicines are outlined above.  No orders of the defined types were placed in this encounter.  No orders of the defined types were placed in this encounter.   There are no Patient Instructions on file for this visit.   Time Spent with Patient: I have spent a total of *** minutes with patient reviewing hospital notes, telemetry, EKGs, labs and examining the patient as well as establishing an assessment and plan that was discussed with the patient.  > 50% of time was spent in direct patient care.  Signed, Addison Naegeli. Audie Box, MD, [place]  [address], [place], [place] [address] [phone]  04/26/2021 6:52 PM

## 2021-04-27 ENCOUNTER — Ambulatory Visit: Payer: Medicare Other | Admitting: Cardiovascular Disease

## 2021-04-27 DIAGNOSIS — E782 Mixed hyperlipidemia: Secondary | ICD-10-CM

## 2021-04-27 DIAGNOSIS — I480 Paroxysmal atrial fibrillation: Secondary | ICD-10-CM

## 2021-04-27 DIAGNOSIS — I739 Peripheral vascular disease, unspecified: Secondary | ICD-10-CM

## 2021-04-27 DIAGNOSIS — Z952 Presence of prosthetic heart valve: Secondary | ICD-10-CM

## 2021-04-27 DIAGNOSIS — I2510 Atherosclerotic heart disease of native coronary artery without angina pectoris: Secondary | ICD-10-CM

## 2021-05-01 ENCOUNTER — Emergency Department (HOSPITAL_COMMUNITY): Payer: Medicare Other

## 2021-05-01 ENCOUNTER — Other Ambulatory Visit: Payer: Self-pay

## 2021-05-01 ENCOUNTER — Inpatient Hospital Stay (HOSPITAL_COMMUNITY)
Admission: EM | Admit: 2021-05-01 | Discharge: 2021-05-06 | DRG: 070 | Disposition: A | Discharge status: Skilled Nursing Facility | Payer: Medicare Other | Source: Skilled Nursing Facility | Attending: Family Medicine | Admitting: Family Medicine

## 2021-05-01 DIAGNOSIS — Z20822 Contact with and (suspected) exposure to covid-19: Secondary | ICD-10-CM | POA: Diagnosis present

## 2021-05-01 DIAGNOSIS — I120 Hypertensive chronic kidney disease with stage 5 chronic kidney disease or end stage renal disease: Secondary | ICD-10-CM | POA: Diagnosis present

## 2021-05-01 DIAGNOSIS — D631 Anemia in chronic kidney disease: Secondary | ICD-10-CM | POA: Diagnosis present

## 2021-05-01 DIAGNOSIS — N189 Chronic kidney disease, unspecified: Secondary | ICD-10-CM | POA: Diagnosis present

## 2021-05-01 DIAGNOSIS — Z8249 Family history of ischemic heart disease and other diseases of the circulatory system: Secondary | ICD-10-CM

## 2021-05-01 DIAGNOSIS — K8010 Calculus of gallbladder with chronic cholecystitis without obstruction: Secondary | ICD-10-CM | POA: Diagnosis present

## 2021-05-01 DIAGNOSIS — L89153 Pressure ulcer of sacral region, stage 3: Secondary | ICD-10-CM | POA: Diagnosis present

## 2021-05-01 DIAGNOSIS — R9431 Abnormal electrocardiogram [ECG] [EKG]: Secondary | ICD-10-CM

## 2021-05-01 DIAGNOSIS — I959 Hypotension, unspecified: Secondary | ICD-10-CM | POA: Diagnosis present

## 2021-05-01 DIAGNOSIS — Z952 Presence of prosthetic heart valve: Secondary | ICD-10-CM

## 2021-05-01 DIAGNOSIS — R410 Disorientation, unspecified: Secondary | ICD-10-CM | POA: Diagnosis present

## 2021-05-01 DIAGNOSIS — R4182 Altered mental status, unspecified: Secondary | ICD-10-CM | POA: Diagnosis not present

## 2021-05-01 DIAGNOSIS — E1151 Type 2 diabetes mellitus with diabetic peripheral angiopathy without gangrene: Secondary | ICD-10-CM | POA: Diagnosis present

## 2021-05-01 DIAGNOSIS — N2581 Secondary hyperparathyroidism of renal origin: Secondary | ICD-10-CM | POA: Diagnosis present

## 2021-05-01 DIAGNOSIS — J90 Pleural effusion, not elsewhere classified: Secondary | ICD-10-CM

## 2021-05-01 DIAGNOSIS — E1122 Type 2 diabetes mellitus with diabetic chronic kidney disease: Secondary | ICD-10-CM | POA: Diagnosis present

## 2021-05-01 DIAGNOSIS — G9340 Encephalopathy, unspecified: Secondary | ICD-10-CM | POA: Diagnosis present

## 2021-05-01 DIAGNOSIS — Z515 Encounter for palliative care: Secondary | ICD-10-CM | POA: Diagnosis not present

## 2021-05-01 DIAGNOSIS — H548 Legal blindness, as defined in USA: Secondary | ICD-10-CM | POA: Diagnosis present

## 2021-05-01 DIAGNOSIS — N186 End stage renal disease: Secondary | ICD-10-CM | POA: Diagnosis present

## 2021-05-01 DIAGNOSIS — N1330 Unspecified hydronephrosis: Secondary | ICD-10-CM | POA: Diagnosis present

## 2021-05-01 DIAGNOSIS — J690 Pneumonitis due to inhalation of food and vomit: Secondary | ICD-10-CM | POA: Diagnosis present

## 2021-05-01 DIAGNOSIS — E7800 Pure hypercholesterolemia, unspecified: Secondary | ICD-10-CM | POA: Diagnosis present

## 2021-05-01 DIAGNOSIS — Z79890 Hormone replacement therapy (postmenopausal): Secondary | ICD-10-CM

## 2021-05-01 DIAGNOSIS — Z8673 Personal history of transient ischemic attack (TIA), and cerebral infarction without residual deficits: Secondary | ICD-10-CM

## 2021-05-01 DIAGNOSIS — R52 Pain, unspecified: Secondary | ICD-10-CM | POA: Diagnosis not present

## 2021-05-01 DIAGNOSIS — Z7189 Other specified counseling: Secondary | ICD-10-CM | POA: Diagnosis not present

## 2021-05-01 DIAGNOSIS — I4510 Unspecified right bundle-branch block: Secondary | ICD-10-CM | POA: Diagnosis present

## 2021-05-01 DIAGNOSIS — E1129 Type 2 diabetes mellitus with other diabetic kidney complication: Secondary | ICD-10-CM | POA: Diagnosis present

## 2021-05-01 DIAGNOSIS — I480 Paroxysmal atrial fibrillation: Secondary | ICD-10-CM | POA: Diagnosis present

## 2021-05-01 DIAGNOSIS — Z8546 Personal history of malignant neoplasm of prostate: Secondary | ICD-10-CM

## 2021-05-01 DIAGNOSIS — Z87891 Personal history of nicotine dependence: Secondary | ICD-10-CM

## 2021-05-01 DIAGNOSIS — Z992 Dependence on renal dialysis: Secondary | ICD-10-CM

## 2021-05-01 DIAGNOSIS — K219 Gastro-esophageal reflux disease without esophagitis: Secondary | ICD-10-CM | POA: Diagnosis present

## 2021-05-01 DIAGNOSIS — Z781 Physical restraint status: Secondary | ICD-10-CM

## 2021-05-01 DIAGNOSIS — Z66 Do not resuscitate: Secondary | ICD-10-CM | POA: Diagnosis present

## 2021-05-01 DIAGNOSIS — G20 Parkinson's disease: Secondary | ICD-10-CM | POA: Diagnosis present

## 2021-05-01 DIAGNOSIS — Z86010 Personal history of colonic polyps: Secondary | ICD-10-CM | POA: Diagnosis not present

## 2021-05-01 DIAGNOSIS — Z89512 Acquired absence of left leg below knee: Secondary | ICD-10-CM

## 2021-05-01 DIAGNOSIS — E781 Pure hyperglyceridemia: Secondary | ICD-10-CM | POA: Diagnosis present

## 2021-05-01 DIAGNOSIS — I10 Essential (primary) hypertension: Secondary | ICD-10-CM | POA: Diagnosis present

## 2021-05-01 LAB — COMPREHENSIVE METABOLIC PANEL
ALT: <5 U/L (ref 0–44)
AST: 21 U/L (ref 15–41)
Albumin: 2.5 g/dL — ABNORMAL LOW (ref 3.5–5.0)
Alkaline Phosphatase: 125 U/L (ref 38–126)
Anion gap: 16 — ABNORMAL HIGH (ref 5–15)
BUN: 25 mg/dL — ABNORMAL HIGH (ref 8–23)
CO2: 28 mmol/L (ref 22–32)
Calcium: 8.7 mg/dL — ABNORMAL LOW (ref 8.9–10.3)
Chloride: 100 mmol/L (ref 98–111)
Creatinine, Ser: 6.14 mg/dL — ABNORMAL HIGH (ref 0.61–1.24)
GFR, Estimated: 9 mL/min — ABNORMAL LOW (ref >60)
Glucose, Bld: 127 mg/dL — ABNORMAL HIGH (ref 70–99)
Potassium: 3.5 mmol/L (ref 3.5–5.1)
Sodium: 144 mmol/L (ref 135–145)
Total Bilirubin: 0.7 mg/dL (ref 0.3–1.2)
Total Protein: 6.3 g/dL — ABNORMAL LOW (ref 6.5–8.1)

## 2021-05-01 LAB — CBC WITH DIFFERENTIAL/PLATELET
Abs Immature Granulocytes: 0.03 10*3/uL (ref 0.00–0.07)
Basophils Absolute: 0.1 10*3/uL (ref 0.0–0.1)
Basophils Relative: 1 %
Eosinophils Absolute: 0.5 10*3/uL (ref 0.0–0.5)
Eosinophils Relative: 6 %
HCT: 28.6 % — ABNORMAL LOW (ref 39.0–52.0)
Hemoglobin: 9 g/dL — ABNORMAL LOW (ref 13.0–17.0)
Immature Granulocytes: 0 %
Lymphocytes Relative: 11 %
Lymphs Abs: 0.8 10*3/uL (ref 0.7–4.0)
MCH: 33.2 pg (ref 26.0–34.0)
MCHC: 31.5 g/dL (ref 30.0–36.0)
MCV: 105.5 fL — ABNORMAL HIGH (ref 80.0–100.0)
Monocytes Absolute: 0.8 10*3/uL (ref 0.1–1.0)
Monocytes Relative: 11 %
Neutro Abs: 5.6 10*3/uL (ref 1.7–7.7)
Neutrophils Relative %: 71 %
Platelets: 282 10*3/uL (ref 150–400)
RBC: 2.71 MIL/uL — ABNORMAL LOW (ref 4.22–5.81)
RDW: 15.4 % (ref 11.5–15.5)
WBC: 7.7 10*3/uL (ref 4.0–10.5)
nRBC: 0 % (ref 0.0–0.2)

## 2021-05-01 LAB — RESP PANEL BY RT-PCR (FLU A&B, COVID) ARPGX2
Influenza A by PCR: NEGATIVE
Influenza B by PCR: NEGATIVE
SARS Coronavirus 2 by RT PCR: NEGATIVE

## 2021-05-01 LAB — CBG MONITORING, ED: Glucose-Capillary: 117 mg/dL — ABNORMAL HIGH (ref 70–99)

## 2021-05-01 LAB — PROTIME-INR
INR: 1 (ref 0.8–1.2)
Prothrombin Time: 13.3 seconds (ref 11.4–15.2)

## 2021-05-01 LAB — LACTIC ACID, PLASMA: Lactic Acid, Venous: 1.1 mmol/L (ref 0.5–1.9)

## 2021-05-01 LAB — AMMONIA: Ammonia: 20 umol/L (ref 9–35)

## 2021-05-01 MED ORDER — ZIPRASIDONE MESYLATE 20 MG IM SOLR
10.0000 mg | Freq: Once | INTRAMUSCULAR | Status: AC
Start: 2021-05-01 — End: 2021-05-01
  Administered 2021-05-01: 10 mg via INTRAMUSCULAR
  Filled 2021-05-01: qty 20

## 2021-05-01 MED ORDER — LIDOCAINE-PRILOCAINE 2.5-2.5 % EX CREA
1.0000 "application " | TOPICAL_CREAM | CUTANEOUS | Status: DC | PRN
  Filled 2021-05-01: qty 5

## 2021-05-01 MED ORDER — HEPARIN SODIUM (PORCINE) 1000 UNIT/ML DIALYSIS
1000.0000 [IU] | INTRAMUSCULAR | Status: DC | PRN
  Filled 2021-05-01: qty 1

## 2021-05-01 MED ORDER — LIDOCAINE HCL (PF) 1 % IJ SOLN
5.0000 mL | INTRAMUSCULAR | Status: DC | PRN
  Filled 2021-05-01: qty 5

## 2021-05-01 MED ORDER — CHLORHEXIDINE GLUCONATE CLOTH 2 % EX PADS
6.0000 | MEDICATED_PAD | Freq: Every day | CUTANEOUS | Status: DC
  Administered 2021-05-02: 6 via TOPICAL

## 2021-05-01 MED ORDER — ALTEPLASE 2 MG IJ SOLR: 2.0000 mg | Freq: Once | INTRAMUSCULAR | Status: DC | PRN

## 2021-05-01 MED ORDER — IOHEXOL 300 MG/ML  SOLN
80.0000 mL | Freq: Once | INTRAMUSCULAR | Status: AC | PRN
  Administered 2021-05-01: 80 mL via INTRAVENOUS

## 2021-05-01 MED ORDER — SODIUM CHLORIDE 0.9 % IV SOLN: 100.0000 mL | INTRAVENOUS | Status: DC | PRN

## 2021-05-01 MED ORDER — STERILE WATER FOR INJECTION IJ SOLN
INTRAMUSCULAR | Status: AC
  Administered 2021-05-01: 1.2 mL
  Filled 2021-05-01: qty 10

## 2021-05-01 MED ORDER — ZIPRASIDONE MESYLATE 20 MG IM SOLR
10.0000 mg | Freq: Once | INTRAMUSCULAR | Status: AC
  Administered 2021-05-01: 10 mg via INTRAMUSCULAR
  Filled 2021-05-01: qty 20

## 2021-05-01 MED ORDER — PENTAFLUOROPROP-TETRAFLUOROETH EX AERO
1.0000 "application " | INHALATION_SPRAY | CUTANEOUS | Status: DC | PRN
  Filled 2021-05-01: qty 116

## 2021-05-01 NOTE — ED Provider Notes (Signed)
°  Provider Note MRN:  [id number]  Arrival date & time: 05/01/21    ED Course and Medical Decision Making  Assumed care from Dr Gilford Raid at shift change at 5:24 PM  See not from prior team for complete details, in brief:  81 yo male with hx of ESRD, was at  HD today x1hr of session; agitated at HD/abnormal behavior, aggressive. S/p geodon while in the ED 2/2 aggressive behavior, biting/scratching at staff. Chronic cholecystitis with perc drain, ct a/p pending. Likely admit for encephalopathy   CT has resulted, cholelithiasis, right-sided pleural effusion was also noted.  Patient remains agitated.  Required Geodon x2.  MRI brain ordered, CT was negative. No focal deficit but remains altered.  Recommend admission for encephalopathy, unclear etiology, per plan from prior team.   D/w Dr Tonie Griffith who accepts pt for admit  Procedures  Final Clinical Impressions(s) / ED Diagnoses     ICD-10-CM   1. Encephalopathy  G93.40     2. ESRD on hemodialysis (HCC)  N18.6    Z99.2     3. Altered mental status, unspecified altered mental status type  R41.82       ED Discharge Orders     None       Discharge Instructions   None         Jeanell Sparrow, DO 05/01/21 2331

## 2021-05-01 NOTE — ED Triage Notes (Addendum)
Pt BIB EMS from dialysis for aggressive behavior. Pt has altered mental status at baseline. Family wanted pt to be seen for aggression. Pt is axox3 on arrival. VSS. Per EMS pt was spitting, biting, and hitting. Pt received 1 hr of dialysis.

## 2021-05-01 NOTE — ED Notes (Signed)
Patient transported to CT 

## 2021-05-01 NOTE — ED Notes (Signed)
Patient is resting comfortably. 

## 2021-05-01 NOTE — ED Provider Notes (Signed)
[name] EMERGENCY DEPARTMENT Provider Note   CSN: [id number] Arrival date & time: 05/01/21  1328     History  Chief Complaint  Patient presents with   Altered Mental Status    Elijah White is a 81 y.o. male.  Pt is a 81 yo aa male with a hx of ESRD on HD MWF, htn, elevated cholesterol and TG, gerd, arthritis, anemia, prostate cancer, dm, glaucoma, PAF not on anticoagulation, parkinson's disease, chronic cholecystitis and pad.  He was at dialysis today and was very agitated and uncooperative.  He was spitting and biting.  Pt is from a SNF.  The paramedic tried to find out his baseline, but no one could tell him.  Pt was admitted here from 2/18-21 for intractable n/v.  He had a percutaneous cholecystotomy on 1/24 and has had a drain since then.  He had aspiration pna on admission on the 18th.  He was put on unasyn and d/c on augmentin.  He had delirium in the hospital on the admission prior to the last one, but there is no hx of dementia in the chart.  Pt is unable to answer any questions.  He just tells me he is going to kill me and break me.  He got 1 hr of dialysis.                  Home Medications Prior to Admission medications   Medication Sig Start Date End Date Taking? Authorizing Provider  acetaminophen (TYLENOL) 500 MG tablet Take 2 tablets (1,000 mg total) by mouth every 8 (eight) hours as needed for moderate pain or headache. 04/13/21   Ghimire, Henreitta Leber, MD  amoxicillin-clavulanate (AUGMENTIN) 500-125 MG tablet Take 1 tablet (500 mg total) by mouth daily at 6 (six) AM for 8 days. Take following dialysis on HD days. 04/25/21 05/03/21  [place] Donnamarie Poag, DO  atorvastatin (LIPITOR) 10 MG tablet Take 1 tablet (10 mg total) by mouth in the morning. 08/16/20   Rhyne, Hulen Shouts, PA-C  B Complex-C-Folic Acid (DIALYVITE 789) 0.8 MG TABS Take 1 tablet by mouth in the morning. 05/08/19   [provider]  baclofen (LIORESAL) 10 MG tablet Take 0.5 tablets (5  mg total) by mouth 3 (three) times daily as needed (hiccups). 04/13/21 04/13/22  GhimireHenreitta Leber, MD  blood glucose meter kit and supplies KIT Dispense based on patient and insurance preference. Use up to four times daily as directed. (FOR ICD-9 250.00, 250.01). For QAC - HS accuchecks. 03/30/18   Thurnell Lose, MD  brimonidine (ALPHAGAN) 0.15 % ophthalmic solution Place 1 drop into the right eye 3 (three) times daily.    [provider]  carbidopa-levodopa (SINEMET) 25-100 MG tablet Take 1 tablet by mouth 3 (three) times daily. Patient taking differently: Take 1 tablet by mouth 3 (three) times daily. 10am, 4pm, 10pm 10/19/20 05/13/21  Lennice Sites, DO  cinacalcet (SENSIPAR) 30 MG tablet Take 2 tablets (60 mg total) by mouth every Monday, Wednesday, and Friday with hemodialysis. 04/05/21   Bonnielee Haff, MD  clopidogrel (PLAVIX) 75 MG tablet Take 1 tablet (75 mg total) by mouth daily with breakfast. 07/25/20   Marty Heck, MD  dorzolamide-timolol (COSOPT) 22.3-6.8 MG/ML ophthalmic solution Place 1 drop into the right eye 2 (two) times daily. 06/25/17   [provider]  ferric citrate (AURYXIA) 1 GM 210 MG(Fe) tablet Take 420 mg by mouth 3 (three) times daily with meals.    [provider]  gabapentin (NEURONTIN) 100 MG capsule Take 1 capsule (100 mg total) by mouth daily. 10/19/20 05/13/21  Curatolo, Adam, DO  hydrOXYzine (ATARAX) 25 MG tablet Take 25 mg by mouth See admin instructions. Every 8 hours as needed for itching x 14 days 04/07/21   [provider]  hypromellose (GENTEAL SEVERE) 0.3 % GEL ophthalmic ointment Place 1 application into the left eye every 12 (twelve) hours as needed for dry eyes.    [provider]  melatonin 5 MG TABS Take 5 mg by mouth at bedtime.    [provider]  metoCLOPramide (REGLAN) 10 MG tablet Take 1 tablet (10 mg total) by mouth every 6 (six) hours as needed for nausea or vomiting. 04/25/21   [place] Donnamarie Poag,  DO  midodrine (PROAMATINE) 10 MG tablet Take 1 tablet (10 mg total) by mouth 3 (three) times daily with meals as needed (SBP <100). 04/25/21   [place] Eric J, DO  Netarsudil-Latanoprost (ROCKLATAN) 0.02-0.005 % SOLN Place 1 drop into the right eye at bedtime.    [provider]  nystatin (MYCOSTATIN) 100000 UNIT/ML suspension Take 5 mLs (500,000 Units total) by mouth 4 (four) times daily. 03/26/21   Florencia Reasons, MD  ondansetron (ZOFRAN) 4 MG tablet Take 4 mg by mouth every 8 (eight) hours as needed for nausea or vomiting.    [provider]  [facility] VERIO test strip USE AS DIRECTED TO TEST FOUR TIMES A DAY 07/31/18   [name], MD  pantoprazole (PROTONIX) 40 MG tablet Take 1 tablet (40 mg total) by mouth 2 (two) times daily. 04/13/21   Ghimire, Henreitta Leber, MD  pilocarpine (PILOCAR) 4 % ophthalmic solution Place 1 drop into the right eye 4 (four) times daily.     [provider]  Pollen Extracts (PROSTAT PO) Take 30 mLs by mouth in the morning and at bedtime.    [provider]  polyethylene glycol (MIRALAX / GLYCOLAX) 17 g packet Take 17 g by mouth daily. Patient taking differently: Take 17 g by mouth every morning. Mix in 4 oz water and drink 03/26/21   Florencia Reasons, MD  senna-docusate (SENOKOT-S) 8.6-50 MG tablet Take 1 tablet by mouth at bedtime. 03/26/21   Florencia Reasons, MD  Vitamin D, Ergocalciferol, (DRISDOL) 1.25 MG (50000 UNIT) CAPS capsule Take 50,000 Units by mouth See admin instructions. Every Monday x 8 weeks    [provider]      Allergies    Patient has no known allergies.    Review of Systems   Review of Systems  Unable to perform ROS: Mental status change  All other systems reviewed and are negative.  Physical Exam Updated Vital Signs BP (!) 150/77    Pulse 80    Temp 98.5 F (36.9 C) (Oral)    Resp 16    SpO2 100%  Physical Exam Vitals and nursing note reviewed.  HENT:     Head: Normocephalic and atraumatic.     Right Ear: External ear  normal.     Left Ear: External ear normal.     Nose: Nose normal.     Mouth/Throat:     Mouth: Mucous membranes are dry.  Eyes:     Comments: Legally blind  Cardiovascular:     Rate and Rhythm: Regular rhythm. Tachycardia present.     Pulses: Normal pulses.     Heart sounds: Normal heart sounds.  Pulmonary:     Effort: Pulmonary effort is normal.     Breath sounds:  Normal breath sounds.  Abdominal:     General: Abdomen is flat. Bowel sounds are normal.     Palpations: Abdomen is soft.  Musculoskeletal:     Cervical back: Normal range of motion and neck supple.     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     Capillary Refill: Capillary refill takes less than 2 seconds.  Neurological:     General: No focal deficit present.     Mental Status: He is alert.  Psychiatric:        Behavior: Behavior is agitated and aggressive.     Comments: Biting, spitting, punching    ED Results / Procedures / Treatments   Labs (all labs ordered are listed, but only abnormal results are displayed) Labs Reviewed  CBC WITH DIFFERENTIAL/PLATELET - Abnormal; Notable for the following components:      Result Value   RBC 2.71 (*)    Hemoglobin 9.0 (*)    HCT 28.6 (*)    MCV 105.5 (*)    All other components within normal limits  COMPREHENSIVE METABOLIC PANEL - Abnormal; Notable for the following components:   Glucose, Bld 127 (*)    BUN 25 (*)    Creatinine, Ser 6.14 (*)    Calcium 8.7 (*)    Total Protein 6.3 (*)    Albumin 2.5 (*)    GFR, Estimated 9 (*)    Anion gap 16 (*)    All other components within normal limits  CBG MONITORING, ED - Abnormal; Notable for the following components:   Glucose-Capillary 117 (*)    All other components within normal limits  RESP PANEL BY RT-PCR (FLU A&B, COVID) [id number]  AMMONIA  LACTIC ACID, PLASMA  PROTIME-INR  URINALYSIS, ROUTINE W REFLEX MICROSCOPIC  HEPATITIS B SURFACE ANTIGEN  HEPATITIS B SURFACE  ANTIBODY,QUALITATIVE  HEPATITIS B SURFACE ANTIBODY, QUANTITATIVE    EKG EKG Interpretation  Date/Time:  Monday May 01 2021 13:49:35 EST Ventricular Rate:  92 PR Interval:  138 QRS Duration: 119 QT Interval:  392 QTC Calculation: 485 R Axis:   94 Text Interpretation: Sinus rhythm Incomplete right bundle branch block No significant change since last tracing Confirmed by Isla Pence [phone]) on 05/01/2021 2:01:40 PM  Radiology CT Head Wo Contrast  Result Date: 05/01/2021 CLINICAL DATA:  Altered mental status EXAM: CT HEAD WITHOUT CONTRAST TECHNIQUE: Contiguous axial images were obtained from the base of the skull through the vertex without intravenous contrast. RADIATION DOSE REDUCTION: This exam was performed according to the departmental dose-optimization program which includes automated exposure control, adjustment of the mA and/or kV according to patient size and/or use of iterative reconstruction technique. COMPARISON:  CT head 04/22/2021 FINDINGS: Brain: No acute intracranial hemorrhage, mass effect, or herniation. No extra-axial fluid collections. No evidence of acute territorial infarct. No hydrocephalus. Mild-to-moderate cortical volume loss. Patchy hypodensities throughout the periventricular and subcortical white matter, likely secondary to chronic microvascular ischemic changes. 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 Small old infarct in the right cerebellum. Vascular: Calcified plaques in the carotid siphons. Skull: Normal. Negative for fracture or focal lesion. Sinuses/Orbits: No acute finding. Other: None. IMPRESSION: No acute intracranial process identified. 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 Electronically Signed   By: Ofilia Neas M.D.   On: 05/01/2021 16:31   DG Chest Portable 1 View  Result Date: 05/01/2021 CLINICAL DATA:  Altered mental status, difficulty breathing EXAM: PORTABLE CHEST 1 VIEW  COMPARISON:  04/22/2021  FINDINGS: Transverse diameter of heart is increased. There is opacification of right lower lung fields suggesting right pleural effusion and possibly underlying infiltrate. This finding has not changed significantly. There are no signs of alveolar pulmonary edema or new focal infiltrates. There is no pneumothorax. IMPRESSION: No significant interval changes are noted in the right pleural effusion and infiltrate in the right lower lung fields. Electronically Signed   By: Ernie Avena M.D.   On: 05/01/2021 15:08    Procedures Procedures    Medications Ordered in ED Medications  Chlorhexidine Gluconate Cloth 2 % PADS 6 each (has no administration in time range)  pentafluoroprop-tetrafluoroeth (GEBAUERS) aerosol 1 application (has no administration in time range)  lidocaine (PF) (XYLOCAINE) 1 % injection 5 mL (has no administration in time range)  lidocaine-prilocaine (EMLA) cream 1 application (has no administration in time range)  0.9 %  sodium chloride infusion (has no administration in time range)  0.9 %  sodium chloride infusion (has no administration in time range)  heparin injection 1,000 Units (has no administration in time range)  alteplase (CATHFLO ACTIVASE) injection 2 mg (has no administration in time range)  ziprasidone (GEODON) injection 10 mg (10 mg Intramuscular Given 05/01/21 1426)    ED Course/ Medical Decision Making/ A&P                           Medical Decision Making Amount and/or Complexity of Data Reviewed Labs: ordered. Radiology: ordered.  Risk Prescription drug management.   This patient presents to the ED for concern of AMS, this involves an extensive number of treatment options, and is a complaint that carries with it a high risk of complications and morbidity.  The differential diagnosis includes metabolic enceph, infection, cva   Co morbidities that complicate the patient evaluation  ESRD on HD MWF, htn, elevated cholesterol and TG, gerd,  arthritis, anemia, prostate cancer, dm, glaucoma, PAF not on anticoagulation, parkinson's disease, chronic cholecystitis and pad   Additional history obtained:  Additional history obtained from epic chart review External records from outside source obtained and reviewed including EMS report   Lab Tests:  I Ordered, and personally interpreted labs.  The pertinent results include:  elevated cr and bun c/w esrd, chronic anemia   Imaging Studies ordered:  I ordered imaging studies including ct abd/pelvis  I independently visualized and interpreted imaging which showed pending at shift change I agree with the radiologist interpretation   Cardiac Monitoring:  The patient was maintained on a cardiac monitor.  I personally viewed and interpreted the cardiac monitored which showed an underlying rhythm of: nsr   Medicines ordered and prescription drug management:  I ordered medication including geodon  for agitation  Reevaluation of the patient after these medicines showed that the patient improved I have reviewed the patients home medicines and have made adjustments as needed   Test Considered:  Ct abd/pelvis due to chronic cholecystitis   Critical Interventions:  Geodon for staff and pt safety   Problem List / ED Course:  Agitation:  No reason found yet.  CT abd/pelvis pending.   Reevaluation:  After the interventions noted above, I reevaluated the patient and found that they have :improved   Social Determinants of Health:  Lives at a snf  Pt signed out to Dr. Gwenette Greet at shift change.       Final Clinical Impression(s) / ED Diagnoses Final diagnoses:  ESRD on hemodialysis (HCC)  Altered mental status, unspecified altered mental status type    Rx / DC Orders ED Discharge Orders     None         Isla Pence, MD 05/01/21 1655

## 2021-05-01 NOTE — ED Triage Notes (Incomplete)
Patient BIB [facility]

## 2021-05-01 NOTE — ED Notes (Signed)
CT notified that pt has PIV for CT scan.

## 2021-05-01 NOTE — Progress Notes (Addendum)
[facility] Progress Note   Subjective:    Patient presented back to the ED for agitation/AMS. Noted recent hospitalization 04/22/21-04/25/21 for intractable N/V with aspiration; s/p percutaneous cholecystostomy 03/28/21. Patient seen and examined at bedside in ED. Last HD on 04/28/21. Currently on 3-point restraints with spontaneous movement. Afebrile and normotensive. CXR shows R pleural effusion and infiltrate unchanged. No evidence for pulmonary edema or any new infiltrates. Plan for HD tomorrow or sooner depending on lab results.  Objective Vitals:   05/01/21 1415 05/01/21 1440 05/01/21 1445 05/01/21 1530  BP:  (!) 141/82 115/77 116/68  Pulse: 89 (!) 105 100 86  Resp: 10 (!) 22 16 13   Temp:      TempSrc:      SpO2: 93% 98% 98% 98%   Physical Exam General: Ill-appearing; not following commands; noted spontaneous movement; on 3-point restraints Heart: S1 and S2; No murmurs, gallops, or rubs Lungs: Clear anteriorly; No wheezing, rales, or rhonchi Abdomen: Flat and soft; noted perc chole tube in RUQ Extremities: No edema LLE Dialysis Access: L AVF (+) B/T  There were no vitals filed for this visit. No intake or output data in the 24 hours ending 05/01/21 1550  Additional Objective Labs: Basic Metabolic Panel: Recent Labs  Lab 04/25/21 0449  NA 138  K 4.0  CL 101  CO2 27  GLUCOSE 91  BUN 17  CREATININE 4.68*  CALCIUM 8.2*  PHOS 1.9*   Liver Function Tests: Recent Labs  Lab 04/25/21 0449  ALBUMIN 2.0*   No results for input(s): LIPASE, AMYLASE in the last 168 hours. CBC: Recent Labs  Lab 04/25/21 0449  WBC 11.5*  HGB 7.9*  HCT 25.3*  MCV 105.9*  PLT 277   Blood Culture    Component Value Date/Time   SDES BLOOD RIGHT ANTECUBITAL 04/08/2021 0133   SDES BLOOD RIGHT HAND 04/08/2021 0133   SPECREQUEST AEROBIC BOTTLE ONLY Blood Culture adequate volume 04/08/2021 0133   SPECREQUEST AEROBIC BOTTLE ONLY Blood Culture adequate volume 04/08/2021 0133    CULT  04/08/2021 0133    NO GROWTH 5 DAYS Performed at [facility], [place] [address] [place], [place] [address]    CULT  04/08/2021 0133    NO GROWTH 5 DAYS Performed at [facility], [place] [address] [place], [place] [address]    REPTSTATUS 04/13/2021 FINAL 04/08/2021 0133   REPTSTATUS 04/13/2021 FINAL 04/08/2021 0133    Cardiac Enzymes: No results for input(s): CKTOTAL, CKMB, CKMBINDEX, TROPONINI in the last 168 hours. CBG: Recent Labs  Lab 04/24/21 2044 04/25/21 0740 04/25/21 1116 04/25/21 1645 05/01/21 1433  GLUCAP 158* 78 98 136* 117*   Iron Studies: No results for input(s): IRON, TIBC, TRANSFERRIN, FERRITIN in the last 72 hours. Lab Results  Component Value Date   INR 0.9 04/07/2021   INR 1.2 03/28/2021   INR 1.0 01/14/2020   Studies/Results: DG Chest Portable 1 View  Result Date: 05/01/2021 CLINICAL DATA:  Altered mental status, difficulty breathing EXAM: PORTABLE CHEST 1 VIEW COMPARISON:  04/22/2021 FINDINGS: Transverse diameter of heart is increased. There is opacification of right lower lung fields suggesting right pleural effusion and possibly underlying infiltrate. This finding has not changed significantly. There are no signs of alveolar pulmonary edema or new focal infiltrates. There is no pneumothorax. IMPRESSION: No significant interval changes are noted in the right pleural effusion and infiltrate in the right lower lung fields. Electronically Signed   By: Elmer Picker M.D.   On: 05/01/2021 15:08  Dialysis Orders: MWF at [facility]. Lives at [place], 400/500, EDW 65kg, 2K/2Ca, #2, LUE AVF, no heparin - Hectoral 32mcg IV q HD - Sensipar 60mg  PO q HD - Mircera 165mcg IV q 2 weeks (last 2/15)  Assessment/Plan: 1. AMS/Agitation: Currently on 3-point restraints; not following commands; spontaneous movements; CT head and U/A ordered; managed by primary 2. ESRD - on HD MWF; did not receive HD today d/t agitation. D/t 3-point  restraints, cannot dialyze on HD unit. HD will need to be done at patient's bedside. Discussed this with HD Charge RN, plan for HD tomorrow 2/28. May have to schedule HD sooner depending on lab results. Monitor his behavior closely. 3. Anemia of CKD- Hgb 9; ESA due 05/03/21 4. Secondary hyperparathyroidism - awaiting lab results 5. HTN/volume - Appears euvolemic on exam, Bps seem to be improving 6. Nutrition - NPO for now; can advance to renal diet with fluid restriction once clinically stable  Tobie Poet, NP [facility] 05/01/2021,3:50 PM  LOS: 0 days

## 2021-05-01 NOTE — Progress Notes (Signed)
IVT consulted for difficult PIV placement.  Upon assessment on R arm w/US, pt starting hitting my hand stating "no."  Pt began spitting.  IVT RN discontinued assessment and reported to [place], primary RN

## 2021-05-02 ENCOUNTER — Encounter (HOSPITAL_COMMUNITY): Payer: Self-pay | Admitting: Family Medicine

## 2021-05-02 ENCOUNTER — Inpatient Hospital Stay (HOSPITAL_COMMUNITY): Payer: Medicare Other

## 2021-05-02 DIAGNOSIS — G9340 Encephalopathy, unspecified: Principal | ICD-10-CM

## 2021-05-02 DIAGNOSIS — J90 Pleural effusion, not elsewhere classified: Secondary | ICD-10-CM

## 2021-05-02 DIAGNOSIS — R9431 Abnormal electrocardiogram [ECG] [EKG]: Secondary | ICD-10-CM

## 2021-05-02 LAB — CBC
HCT: 27.3 % — ABNORMAL LOW (ref 39.0–52.0)
Hemoglobin: 8.8 g/dL — ABNORMAL LOW (ref 13.0–17.0)
MCH: 33.8 pg (ref 26.0–34.0)
MCHC: 32.2 g/dL (ref 30.0–36.0)
MCV: 105 fL — ABNORMAL HIGH (ref 80.0–100.0)
Platelets: 284 10*3/uL (ref 150–400)
RBC: 2.6 MIL/uL — ABNORMAL LOW (ref 4.22–5.81)
RDW: 15.3 % (ref 11.5–15.5)
WBC: 7 10*3/uL (ref 4.0–10.5)
nRBC: 0 % (ref 0.0–0.2)

## 2021-05-02 LAB — BASIC METABOLIC PANEL
Anion gap: 13 (ref 5–15)
BUN: 27 mg/dL — ABNORMAL HIGH (ref 8–23)
CO2: 30 mmol/L (ref 22–32)
Calcium: 8.7 mg/dL — ABNORMAL LOW (ref 8.9–10.3)
Chloride: 102 mmol/L (ref 98–111)
Creatinine, Ser: 6.56 mg/dL — ABNORMAL HIGH (ref 0.61–1.24)
GFR, Estimated: 8 mL/min — ABNORMAL LOW (ref >60)
Glucose, Bld: 87 mg/dL (ref 70–99)
Potassium: 3.5 mmol/L (ref 3.5–5.1)
Sodium: 145 mmol/L (ref 135–145)

## 2021-05-02 LAB — HEPATITIS B SURFACE ANTIGEN: Hepatitis B Surface Ag: NONREACTIVE

## 2021-05-02 LAB — HEPATITIS B SURFACE ANTIBODY,QUALITATIVE: Hep B S Ab: NONREACTIVE

## 2021-05-02 MED ORDER — LORAZEPAM 2 MG/ML IJ SOLN
1.0000 mg | Freq: Once | INTRAMUSCULAR | Status: AC
Start: 2021-05-02 — End: 2021-05-02
  Administered 2021-05-02: 1 mg via INTRAVENOUS

## 2021-05-02 MED ORDER — DORZOLAMIDE HCL-TIMOLOL MAL 2-0.5 % OP SOLN
1.0000 [drp] | Freq: Two times a day (BID) | OPHTHALMIC | Status: DC
  Administered 2021-05-02 – 2021-05-06 (×6): 1 [drp] via OPHTHALMIC
  Filled 2021-05-02: qty 10

## 2021-05-02 MED ORDER — SODIUM CHLORIDE 0.9% FLUSH
3.0000 mL | Freq: Two times a day (BID) | INTRAVENOUS | Status: DC
  Administered 2021-05-02 – 2021-05-05 (×7): 3 mL via INTRAVENOUS

## 2021-05-02 MED ORDER — LORAZEPAM 2 MG/ML IJ SOLN
INTRAMUSCULAR | Status: AC
  Filled 2021-05-02: qty 1

## 2021-05-02 MED ORDER — SODIUM CHLORIDE 0.9% FLUSH: 3.0000 mL | INTRAVENOUS | Status: DC | PRN

## 2021-05-02 MED ORDER — AMOXICILLIN-POT CLAVULANATE 500-125 MG PO TABS
1.0000 | ORAL_TABLET | Freq: Once | ORAL | Status: AC
  Administered 2021-05-03: 500 mg via ORAL
  Filled 2021-05-02: qty 1

## 2021-05-02 MED ORDER — SODIUM CHLORIDE 0.9 % IV SOLN: 250.0000 mL | INTRAVENOUS | Status: DC | PRN

## 2021-05-02 MED ORDER — LORAZEPAM 2 MG/ML IJ SOLN
1.0000 mg | INTRAMUSCULAR | Status: DC | PRN
  Filled 2021-05-02: qty 1

## 2021-05-02 MED ORDER — QUETIAPINE FUMARATE 25 MG PO TABS
25.0000 mg | ORAL_TABLET | ORAL | Status: DC
  Administered 2021-05-02 – 2021-05-05 (×4): 25 mg via ORAL
  Filled 2021-05-02 (×5): qty 1

## 2021-05-02 MED ORDER — METOCLOPRAMIDE HCL 10 MG PO TABS
5.0000 mg | ORAL_TABLET | Freq: Two times a day (BID) | ORAL | Status: DC | PRN
  Administered 2021-05-04: 5 mg via ORAL
  Filled 2021-05-02 (×2): qty 1

## 2021-05-02 MED ORDER — PANTOPRAZOLE SODIUM 40 MG PO TBEC
40.0000 mg | DELAYED_RELEASE_TABLET | Freq: Two times a day (BID) | ORAL | Status: DC
  Administered 2021-05-02 – 2021-05-06 (×8): 40 mg via ORAL
  Filled 2021-05-02 (×8): qty 1

## 2021-05-02 MED ORDER — ACETAMINOPHEN 325 MG PO TABS: 650.0000 mg | ORAL_TABLET | Freq: Four times a day (QID) | ORAL | Status: DC | PRN

## 2021-05-02 MED ORDER — FERRIC CITRATE 1 GM 210 MG(FE) PO TABS
420.0000 mg | ORAL_TABLET | Freq: Three times a day (TID) | ORAL | Status: DC
  Administered 2021-05-03 – 2021-05-06 (×5): 420 mg via ORAL
  Filled 2021-05-02 (×6): qty 2

## 2021-05-02 MED ORDER — ACETAMINOPHEN 650 MG RE SUPP: 650.0000 mg | Freq: Four times a day (QID) | RECTAL | Status: DC | PRN

## 2021-05-02 MED ORDER — CLOPIDOGREL BISULFATE 75 MG PO TABS
75.0000 mg | ORAL_TABLET | Freq: Every day | ORAL | Status: DC
  Administered 2021-05-03 – 2021-05-06 (×3): 75 mg via ORAL
  Filled 2021-05-02 (×4): qty 1

## 2021-05-02 MED ORDER — EYE WASH OPHTH SOLN
1.0000 [drp] | Freq: Two times a day (BID) | OPHTHALMIC | Status: DC | PRN
  Filled 2021-05-02: qty 118

## 2021-05-02 MED ORDER — PILOCARPINE HCL 4 % OP SOLN
1.0000 [drp] | Freq: Four times a day (QID) | OPHTHALMIC | Status: DC
  Administered 2021-05-02 – 2021-05-06 (×12): 1 [drp] via OPHTHALMIC
  Filled 2021-05-02: qty 15

## 2021-05-02 MED ORDER — MIDODRINE HCL 5 MG PO TABS
10.0000 mg | ORAL_TABLET | Freq: Three times a day (TID) | ORAL | Status: DC | PRN
  Administered 2021-05-04 – 2021-05-05 (×2): 10 mg via ORAL
  Filled 2021-05-02 (×2): qty 2

## 2021-05-02 MED ORDER — BRIMONIDINE TARTRATE 0.15 % OP SOLN
1.0000 [drp] | Freq: Three times a day (TID) | OPHTHALMIC | Status: DC
  Administered 2021-05-02 – 2021-05-06 (×10): 1 [drp] via OPHTHALMIC
  Filled 2021-05-02: qty 5

## 2021-05-02 MED ORDER — NETARSUDIL-LATANOPROST 0.02-0.005 % OP SOLN: 1.0000 [drp] | Freq: Every day | OPHTHALMIC | Status: DC

## 2021-05-02 MED ORDER — HYPROMELLOSE 0.3 % OP GEL
1.0000 "application " | Freq: Two times a day (BID) | OPHTHALMIC | Status: DC | PRN
  Filled 2021-05-02: qty 3.5

## 2021-05-02 MED ORDER — CARBIDOPA-LEVODOPA 25-100 MG PO TABS
1.0000 | ORAL_TABLET | Freq: Three times a day (TID) | ORAL | Status: DC
  Administered 2021-05-02 – 2021-05-06 (×10): 1 via ORAL
  Filled 2021-05-02 (×12): qty 1

## 2021-05-02 MED ORDER — ATORVASTATIN CALCIUM 10 MG PO TABS
10.0000 mg | ORAL_TABLET | Freq: Every morning | ORAL | Status: DC
  Administered 2021-05-05 – 2021-05-06 (×2): 10 mg via ORAL
  Filled 2021-05-02 (×3): qty 1

## 2021-05-02 MED ORDER — SENNOSIDES-DOCUSATE SODIUM 8.6-50 MG PO TABS
1.0000 | ORAL_TABLET | Freq: Every day | ORAL | Status: DC
  Administered 2021-05-02: 1 via ORAL
  Filled 2021-05-02: qty 1

## 2021-05-02 NOTE — Assessment & Plan Note (Signed)
Stable.  May need IR to evaluate for thorocentesis

## 2021-05-02 NOTE — ED Notes (Signed)
RN updated [name], daughter

## 2021-05-02 NOTE — ED Notes (Signed)
Patient transported to MRI 

## 2021-05-02 NOTE — H&P (Signed)
History and Physical    Patient: Elijah White JFH:[id number] DOB: 1941/01/23 DOA: 05/01/2021 DOS: the patient was seen and examined on 05/02/2021 PCP: Maryella Shivers, MD  Patient coming from: SNF via Dialysis  Chief Complaint:  Chief Complaint  Patient presents with   Altered Mental Status    HPI: Elijah White is a 81 y.o. 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 One hour into his session he became agitated and was aggressive to staff. He was biting, scratching, trying to hit staff.  HD was stopped and he was sent to the emergency room.  He remained agitated in the emergency room and was given 2 doses of Geodon.  CT of the head was obtained which was negative for acute pathology.  He remained altered in the emergency room.  His ammonia level was normal.  Percutaneous drain for his chronic cholecystitis is still in place.  He is afebrile.  There is no report of seizure activity. Encephalopathy of an unclear etiology by the brain has been ordered to further evaluate and make sure no CVA has occurred causing the cephalopathy Hospitalist service been asked to admit for further management. Patient's had a complicated course with multiple admissions over the last few months.  Patient is unable to provide any history  Review of Systems: unable to review all systems due to the inability of the patient to answer questions. Past Medical History:  Diagnosis Date   Anemia    low iron   Aortic stenosis    s/p TAVR 01/19/20   Arthritis    Cancer [facility])    prostate, s/p I-125 seed implant 05/18/05   Colon polyps ~ 1993 and 2003   Dr [name], [facility].  08/2001 colonoscopy: tubular adenoma at cecum.     Diabetes mellitus  without complication (HCC)    Type II - no medications   Elevated cholesterol with high triglycerides    ESRD (end stage renal disease) (HCC)    [facility]   GERD (gastroesophageal reflux disease)    Glaucoma    Hypertension    Legally blind in left eye, as defined in [place]    has pinpoint vision in right eye   PAD (peripheral artery disease) ([place])    RBBB    Past Surgical History:  Procedure Laterality Date   ABDOMINAL AORTOGRAM W/LOWER EXTREMITY N/A 07/25/2020   Procedure: ABDOMINAL AORTOGRAM W/LOWER EXTREMITY;  Surgeon: Marty Heck, MD;  Location: [place] [facility];  Service: Cardiovascular;  Laterality: N/A;   AMPUTATION Left 08/12/2020   Procedure: LEFT BELOW KNEE AMPUTATION;  Surgeon: Marty Heck, MD;  Location: [place];  Service: Vascular;  Laterality: Left;   AV FISTULA PLACEMENT Left 03/20/2018   Procedure: ARTERIOVENOUS (AV) FISTULA CREATION ARM;  Surgeon: [name], MD;  Location: [place];  Service: Vascular;  Laterality: Left;   [place] Left 05/21/2018   Procedure: LEFT BASILIC VEIN FISTULA SECOND STAGE;  Surgeon: [name], MD;  Location: [place];  Service: Vascular;  Laterality: Left;   BIOPSY  03/25/2021   Procedure: BIOPSY;  Surgeon: Otis Brace, MD;  Location: [place];  Service: Gastroenterology;;   COLONOSCOPY     COLONOSCOPY WITH PROPOFOL N/A 11/30/2019   Procedure: COLONOSCOPY WITH PROPOFOL;  Surgeon: Ronnette Juniper, MD;  Location: [place];  Service: Gastroenterology;  Laterality: N/A;   ESOPHAGOGASTRODUODENOSCOPY (EGD) WITH PROPOFOL N/A 03/25/2021   Procedure: ESOPHAGOGASTRODUODENOSCOPY (EGD) WITH PROPOFOL;  Surgeon: Otis Brace, MD;  Location: [place];  Service: Gastroenterology;  Laterality: N/A;   GLAUCOMA SURGERY  2019   multiple surgeries   HEMOSTASIS CLIP PLACEMENT  11/30/2019   Procedure: HEMOSTASIS CLIP PLACEMENT;  Surgeon: Ronnette Juniper, MD;  Location: [place];   Service: Gastroenterology;;   IR CHOLANGIOGRAM EXISTING TUBE  03/21/2021   IR PERC CHOLECYSTOSTOMY  03/16/2021   IR PERC CHOLECYSTOSTOMY  03/28/2021   IR REMOVAL BILIARY DRAIN  03/21/2021   KNEE ARTHROSCOPY     MULTIPLE EXTRACTIONS WITH ALVEOLOPLASTY N/A 12/30/2019   Procedure: MULTIPLE EXTRACTION WITH ALVEOLOPLASTY;  Surgeon: Charlaine Dalton, DMD;  Location: [place];  Service: Dentistry;  Laterality: N/A;   PERIPHERAL VASCULAR INTERVENTION Left 07/25/2020   Procedure: PERIPHERAL VASCULAR INTERVENTION;  Surgeon: Marty Heck, MD;  Location: [place] [facility];  Service: Cardiovascular;  Laterality: Left;  superficial femoral   POLYPECTOMY  11/30/2019   Procedure: POLYPECTOMY;  Surgeon: Ronnette Juniper, MD;  Location: [place];  Service: Gastroenterology;;   PROSTATE SURGERY     RIGHT/LEFT HEART CATH AND CORONARY ANGIOGRAPHY N/A 12/03/2019   Procedure: RIGHT/LEFT HEART CATH AND CORONARY ANGIOGRAPHY;  Surgeon: Burnell Blanks, MD;  Location: [place] [facility];  Service: Cardiovascular;  Laterality: N/A;   SUBMUCOSAL TATTOO INJECTION  11/30/2019   Procedure: SUBMUCOSAL TATTOO INJECTION;  Surgeon: Ronnette Juniper, MD;  Location: [place];  Service: Gastroenterology;;   TEE WITHOUT CARDIOVERSION N/A 01/19/2020   Procedure: TRANSESOPHAGEAL ECHOCARDIOGRAM (TEE);  Surgeon: Burnell Blanks, MD;  Location: [place] [facility];  Service: Open Heart Surgery;  Laterality: N/A;   TRANSCATHETER AORTIC VALVE REPLACEMENT, TRANSFEMORAL Left 01/19/2020   Procedure: TRANSCATHETER AORTIC VALVE REPLACEMENT, LEFT TRANSFEMORAL;  Surgeon: Burnell Blanks, MD;  Location: [place] [facility];  Service: Open Heart Surgery;  Laterality: Left;   Social History:  reports that he quit smoking about 3 years ago. His smoking use included cigarettes. He smoked an average of .5 packs per day. He has never used smokeless tobacco. He reports that he does not drink alcohol and does not use drugs.  No Known  Allergies  Family History  Problem Relation Age of Onset   CAD Mother     Prior to Admission medications   Medication Sig Start Date End Date Taking? Authorizing Provider  acetaminophen (TYLENOL) 500 MG tablet Take 2 tablets (1,000 mg total) by mouth every 8 (eight) hours as needed for moderate pain or headache. 04/13/21   Ghimire, Henreitta Leber, MD  amoxicillin-clavulanate (AUGMENTIN) 500-125 MG tablet Take 1 tablet (500 mg total) by mouth daily at 6 (six) AM for 8 days. Take following dialysis on HD days. 04/25/21 05/03/21  [place] Donnamarie Poag, DO  atorvastatin (LIPITOR) 10 MG tablet Take 1 tablet (10 mg total) by mouth in the morning. 08/16/20   Rhyne, Hulen Shouts, PA-C  B Complex-C-Folic Acid (DIALYVITE 790) 0.8 MG TABS Take 1 tablet by mouth in the morning. 05/08/19   [provider]  baclofen (LIORESAL) 10 MG tablet Take 0.5 tablets (5 mg total) by mouth 3 (three) times daily as needed (hiccups). 04/13/21 04/13/22  GhimireHenreitta Leber, MD  blood glucose meter kit and supplies KIT Dispense based on patient and insurance preference. Use up to four times daily as directed. (FOR ICD-9 250.00, 250.01). For QAC - HS accuchecks. 03/30/18  Thurnell Lose, MD  brimonidine (ALPHAGAN) 0.15 % ophthalmic solution Place 1 drop into the right eye 3 (three) times daily.    [provider]  carbidopa-levodopa (SINEMET) 25-100 MG tablet Take 1 tablet by mouth 3 (three) times daily. Patient taking differently: Take 1 tablet by mouth 3 (three) times daily. 10am, 4pm, 10pm 10/19/20 05/13/21  Lennice Sites, DO  cinacalcet (SENSIPAR) 30 MG tablet Take 2 tablets (60 mg total) by mouth every Monday, Wednesday, and Friday with hemodialysis. 04/05/21   Bonnielee Haff, MD  clopidogrel (PLAVIX) 75 MG tablet Take 1 tablet (75 mg total) by mouth daily with breakfast. 07/25/20   Marty Heck, MD  dorzolamide-timolol (COSOPT) 22.3-6.8 MG/ML ophthalmic solution Place 1 drop into the right eye 2 (two) times daily.  06/25/17   [provider]  ferric citrate (AURYXIA) 1 GM 210 MG(Fe) tablet Take 420 mg by mouth 3 (three) times daily with meals.    [provider]  gabapentin (NEURONTIN) 100 MG capsule Take 1 capsule (100 mg total) by mouth daily. 10/19/20 05/13/21  Curatolo, Adam, DO  hydrOXYzine (ATARAX) 25 MG tablet Take 25 mg by mouth See admin instructions. Every 8 hours as needed for itching x 14 days 04/07/21   [provider]  hypromellose (GENTEAL SEVERE) 0.3 % GEL ophthalmic ointment Place 1 application into the left eye every 12 (twelve) hours as needed for dry eyes.    [provider]  melatonin 5 MG TABS Take 5 mg by mouth at bedtime.    [provider]  metoCLOPramide (REGLAN) 10 MG tablet Take 1 tablet (10 mg total) by mouth every 6 (six) hours as needed for nausea or vomiting. 04/25/21   [place] Donnamarie Poag, DO  midodrine (PROAMATINE) 10 MG tablet Take 1 tablet (10 mg total) by mouth 3 (three) times daily with meals as needed (SBP <100). 04/25/21   [place] Eric J, DO  Netarsudil-Latanoprost (ROCKLATAN) 0.02-0.005 % SOLN Place 1 drop into the right eye at bedtime.    [provider]  nystatin (MYCOSTATIN) 100000 UNIT/ML suspension Take 5 mLs (500,000 Units total) by mouth 4 (four) times daily. 03/26/21   Florencia Reasons, MD  ondansetron (ZOFRAN) 4 MG tablet Take 4 mg by mouth every 8 (eight) hours as needed for nausea or vomiting.    [provider]  [facility] VERIO test strip USE AS DIRECTED TO TEST FOUR TIMES A DAY 07/31/18   [name], MD  pantoprazole (PROTONIX) 40 MG tablet Take 1 tablet (40 mg total) by mouth 2 (two) times daily. 04/13/21   Ghimire, Henreitta Leber, MD  pilocarpine (PILOCAR) 4 % ophthalmic solution Place 1 drop into the right eye 4 (four) times daily.     [provider]  Pollen Extracts (PROSTAT PO) Take 30 mLs by mouth in the morning and at bedtime.    [provider]  polyethylene glycol (MIRALAX / GLYCOLAX) 17 g  packet Take 17 g by mouth daily. Patient taking differently: Take 17 g by mouth every morning. Mix in 4 oz water and drink 03/26/21   Florencia Reasons, MD  senna-docusate (SENOKOT-S) 8.6-50 MG tablet Take 1 tablet by mouth at bedtime. 03/26/21   Florencia Reasons, MD  Vitamin D, Ergocalciferol, (DRISDOL) 1.25 MG (50000 UNIT) CAPS capsule Take 50,000 Units by mouth See admin instructions. Every Monday x 8 weeks    [provider]    Physical Exam: Vitals:   05/02/21 0000 05/02/21 0030 05/02/21 0100 05/02/21 0130  BP: (!) 148/78 [name])  113/55 (!) 91/53 140/68  Pulse: 68 75 73 68  Resp: _0 Temp:      TempSrc:      SpO2: 100% 98% 97% 99%   Constitutional: Chronically ill-appearing elderly man NAD, calm, wrist restraints in place Head: [place]/AT.  Eyes: PERRL, conjunctivae normal ENMT: Mucous membranes are moist. Nares patent. Ears without external lesions Neck: normal, supple, no masses. Trachea midline Respiratory: Diminished breath sounds right lower lung field.. Normal respiratory effort. No accessory muscle use.  Cardiovascular: Regular rate and rhythm, no murmurs / rubs / gallops. LUE aVF. Abdomen:  Nondistended. no masses palpated.  Bowel sounds positive.  Cholecystostomy tube in place RUQ with scant dark fluid in bag. Musculoskeletal: S/p left BKA.  No cyanosis. No joint deformity upper and lower extremities.  Skin: Warm, dry. No rash  Data Reviewed: Lab Work: WBC 7700 hemoglobin 9 hematocrit 28.6 platelet 282,000 lactic acid 1.1 sodium 144 potassium 3.5 chloride 100 bicarb 28 creatinine 6.14 BUN 25 glucose 127 calcium 8.7 albumin 2.5 alkaline phosphatase 125 AST 21 ALT less than 5 ammonia 20 bilirubin 0.7 INR 1.0 COVID-negative influenza A and B   Chest x-ray-cardiomegaly.  Right pleural effusion and infiltrate in the right lower lung which are unchanged from previous chest x-ray CT abdomen pelvis with contrast shows cholelithiasis with a cholecystectomy tube in the gallbladder.   Gallbladder mildly distended.  Moderate to large right pleural effusion with right lower lobe atelectasis that is stable.  No infiltrate noted.  Bilateral stable hydronephrosis and obstruction visualized. CT head shows no acute intracranial process.  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  KG shows normal sinus rhythm with incomplete right bundle branch block.  No acute ST elevation or depression.  QTc 485  Assessment and Plan: * Encephalopathy acute- (present on admission) Elijah White with acute onset of encephalopathy. CT head negative. MRI brain ordered and pending to rule out CVA as etiology. Was given geodon in the ER.  Will use ativan if needed for combative or aggressive behavior in light of prolonged QT interval on EKG  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[place]) Will consult nephrology in am for HD needs  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)- (present on admission) Blood sugar well controlled with HgbA1c under 5.5.  Will recheck blood glucose with BMP in am Pt NPO  Essential hypertension- (present on admission) Monitor BP. Pt NPO. BP stable in Er  Anemia in chronic kidney disease- (present on admission) Stable Check CBC in am  Pleural effusion, right Stable.  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 Stable.  Prolonged QT interval Avoid medications which can further prolong QT interval  Advance Care Planning:   Code Status:  DNR.  SCDs for DVT prophylaxis until CVA ruled out. If no CVA can then start heparin  Family Communication: No family present.   Author: Eben Burow, MD 05/02/2021 1:48 AM  For on call review [email or web address].

## 2021-05-02 NOTE — Assessment & Plan Note (Signed)
Mr. Verastegui with acute onset of encephalopathy. CT head negative. MRI brain ordered and pending to rule out CVA as etiology. Was given geodon in the ER.  Will use ativan if needed for combative or aggressive behavior in light of prolonged QT interval on EKG

## 2021-05-02 NOTE — Assessment & Plan Note (Signed)
Stable Check CBC in am

## 2021-05-02 NOTE — Assessment & Plan Note (Signed)
Avoid medications which can further prolong QT interval

## 2021-05-02 NOTE — Assessment & Plan Note (Signed)
Monitor BP. Pt NPO. BP stable in Er

## 2021-05-02 NOTE — ED Notes (Signed)
RN cleaned up patient...new to my practice sheets and chuck applied. Dr at bedside.

## 2021-05-02 NOTE — ED Notes (Signed)
RN notified MD that pt did not pass the swallow screen

## 2021-05-02 NOTE — Progress Notes (Addendum)
PROGRESS NOTE    Elijah White  ZOX:[id number] DOB: [birth year] DOA: 05/01/2021 PCP: Maryella Shivers, MD   Brief Narrative:  HPI: Elijah White is a 81 y.o. 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 One hour into his session he became agitated and was aggressive to staff. He was biting, scratching, trying to hit staff.  HD was stopped and he was sent to the emergency room.  He remained agitated in the emergency room and was given 2 doses of Geodon.  CT of the head was obtained which was negative for acute pathology.  He remained altered in the emergency room.  His ammonia level was normal.  Percutaneous drain for his chronic cholecystitis is still in place.  He is afebrile.  There is no report of seizure activity. Encephalopathy of an unclear etiology by the brain has been ordered to further evaluate and make sure no CVA has occurred causing the cephalopathy Hospitalist service been asked to admit for further management. Patient's had a complicated course with multiple admissions over the last few months.  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  Patient received Geodon early this morning so he was very sleepy when I Elijah him.  ESRD on  MWF hemodialysis [facility]): Nephrology on board and he was getting his dialysis when I Elijah him this morning.   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  No need of SSI.   Hypotension: I do not see any history of hypertension and is not on any hypertensives as well.  In fact his blood pressure is low and he is on midodrine which I am going to resume.   Anemia in chronic kidney disease- (present on admission): Stable   Pleural effusion, right: Very mild.  Does not need thoracentesis as he is not hypoxic and he is getting dialysis so volume should be handled by HD.  X-ray also indicates possible right lower lobe infiltrates but I doubt pneumonia.  Reportedly he was diagnosed with aspiration pneumonia as outpatient and prescribed Augmentin on 04/25/2021 which we will continue and only 1 day more dose left.  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 Stable.   Prolonged QT interval Avoid medications which can further prolong QT interval  DVT prophylaxis: SCDs Start: 05/02/21 0232   Code Status: DNR  Family Communication:  None present at bedside.  We will try to call family later today. Addendum 1:51 PM: Spoke to his daughter [name] over the phone.  Updated her and answered several questions.  Status is: Inpatient Remains inpatient appropriate because: Very encephalopathic and obtunded.  Estimated body mass index is 24.4 kg/m as calculated from the following:   Height as of 04/23/21: 5\' 5"  (  1.651 m).   Weight as of 04/25/21: 66.5 kg.  Pressure Injury 03/28/21 Coccyx Medial Stage 2 -  Partial thickness loss of dermis presenting as a shallow open injury with a red, pink wound bed without slough. wound bed is pink, no drainage noted (Active)  03/28/21 1028  Location: Coccyx  Location Orientation: Medial  Staging: Stage 2 -  Partial thickness loss of dermis  presenting as a shallow open injury with a red, pink wound bed without slough.  Wound Description (Comments): wound bed is pink, no drainage noted  Present on Admission: Yes     Pressure Injury 04/23/21 Coccyx Medial Stage 3 -  Full thickness tissue loss. Subcutaneous fat may be visible but bone, tendon or muscle are NOT exposed. (Active)  04/23/21 0200  Location: Coccyx  Location Orientation: Medial  Staging: Stage 3 -  Full thickness tissue loss. Subcutaneous fat may be visible but bone, tendon or muscle are NOT exposed.  Wound Description (Comments):   Present on Admission: Yes   Nutritional Assessment: There is no height or weight on file to calculate BMI.. Seen by dietician.  I agree with the assessment and plan as outlined below: Nutrition Status:        . Skin Assessment: I have examined the patient's skin and I agree with the wound assessment as performed by the wound care RN as outlined below: Pressure Injury 03/28/21 Coccyx Medial Stage 2 -  Partial thickness loss of dermis presenting as a shallow open injury with a red, pink wound bed without slough. wound bed is pink, no drainage noted (Active)  03/28/21 1028  Location: Coccyx  Location Orientation: Medial  Staging: Stage 2 -  Partial thickness loss of dermis presenting as a shallow open injury with a red, pink wound bed without slough.  Wound Description (Comments): wound bed is pink, no drainage noted  Present on Admission: Yes     Pressure Injury 04/23/21 Coccyx Medial Stage 3 -  Full thickness tissue loss. Subcutaneous fat may be visible but bone, tendon or muscle are NOT exposed. (Active)  04/23/21 0200  Location: Coccyx  Location Orientation: Medial  Staging: Stage 3 -  Full thickness tissue loss. Subcutaneous fat may be visible but bone, tendon or muscle are NOT exposed.  Wound Description (Comments):   Present on Admission: Yes    Consultants:  Nephrology  Procedures:  None  Antimicrobials:   Anti-infectives (From admission, onward)    Start     Dose/Rate Route Frequency Ordered Stop   05/02/21 0800  amoxicillin-clavulanate (AUGMENTIN) 500-125 MG per tablet 500 mg       Note to Pharmacy: Take following dialysis on HD days.     1 tablet Oral Daily 05/02/21 0746           Subjective: Seen and examined.  He is still in the ED.  Getting his hemodialysis.  He was very sleepy.  Reportedly he received Geodon not too long ago.  Objective: Vitals:   05/02/21 0830 05/02/21 0900 05/02/21 0930 05/02/21 1000  BP: 120/85 (!) 99/59 (P) 129/84 (!) (P) 155/81  Pulse: 72 73 (P) 74 (P) 76  Resp:  14 (P) 16 (P) 16  Temp:      TempSrc:      SpO2:       No intake or output data in the 24 hours ending 05/02/21 1043 Filed Weights    Examination:  General exam: Sleepy and comfortable. Respiratory system: Diminished breath sounds at the bases, right more than  the left, no rhonchi. Respiratory effort normal. Cardiovascular system: S1 & S2 heard, RRR. No JVD, murmurs, rubs, gallops or clicks. No pedal edema. Gastrointestinal system: Abdomen is nondistended, soft and nontender. No organomegaly or masses felt. Normal bowel sounds heard. Central nervous system: Sleepy  Data Reviewed: I have personally reviewed following labs and imaging studies  CBC: Recent Labs  Lab 05/01/21 1450 05/02/21 0240  WBC 7.7 7.0  NEUTROABS 5.6  --   HGB 9.0* 8.8*  HCT 28.6* 27.3*  MCV 105.5* 105.0*  PLT 282 161   Basic Metabolic Panel: Recent Labs  Lab 05/01/21 1450 05/02/21 0240  NA 144 145  K 3.5 3.5  CL 100 102  CO2 28 30  GLUCOSE 127* 87  BUN 25* 27*  CREATININE 6.14* 6.56*  CALCIUM 8.7* 8.7*   GFR: Estimated Creatinine Clearance: 7.6 mL/min (A) (by C-G formula based on SCr of 6.56 mg/dL (H)). Liver Function Tests: Recent Labs  Lab 05/01/21 1450  AST 21  ALT <5  ALKPHOS 125  BILITOT 0.7  PROT 6.3*  ALBUMIN 2.5*   No results for input(s): LIPASE, AMYLASE in the last 168  hours. Recent Labs  Lab 05/01/21 1450  AMMONIA 20   Coagulation Profile: Recent Labs  Lab 05/01/21 1450  INR 1.0   Cardiac Enzymes: No results for input(s): CKTOTAL, CKMB, CKMBINDEX, TROPONINI in the last 168 hours. BNP (last 3 results) No results for input(s): PROBNP in the last 8760 hours. HbA1C: No results for input(s): HGBA1C in the last 72 hours. CBG: Recent Labs  Lab 04/25/21 1116 04/25/21 1645 05/01/21 1433  GLUCAP 98 136* 117*   Lipid Profile: No results for input(s): CHOL, HDL, LDLCALC, TRIG, CHOLHDL, LDLDIRECT in the last 72 hours. Thyroid Function Tests: No results for input(s): TSH, T4TOTAL, FREET4, T3FREE, THYROIDAB in the last 72 hours. Anemia Panel: No results for input(s): VITAMINB12, FOLATE, FERRITIN, TIBC, IRON, RETICCTPCT in the last 72 hours. Sepsis Labs: Recent Labs  Lab 05/01/21 1450  LATICACIDVEN 1.1    Recent Results (from the past 240 hour(s))  Resp Panel by RT-PCR (Flu A&B, Covid) Nasopharyngeal Swab     Status: None   Collection Time: 04/22/21  7:10 PM   Specimen: Nasopharyngeal Swab; Nasopharyngeal(NP) swabs in vial transport medium  Result Value Ref Range Status   SARS Coronavirus 2 by RT PCR NEGATIVE NEGATIVE Final    Comment: (NOTE) SARS-CoV-2 target nucleic acids are NOT DETECTED.  The SARS-CoV-2 RNA is generally detectable in upper respiratory specimens during the acute phase of infection. The lowest concentration of SARS-CoV-2 viral copies this assay can detect is 138 copies/mL. A negative result does not preclude SARS-Cov-2 infection and should not be used as the sole basis for treatment or other patient management decisions. A negative result may occur with  improper specimen collection/handling, submission of specimen other than nasopharyngeal swab, presence of viral mutation(s) within the areas targeted by this assay, and inadequate number of viral copies(<138 copies/mL). A negative result must be combined with clinical  observations, patient history, and epidemiological information. The expected result is Negative.  Fact Sheet for Patients:  EntrepreneurPulse.com.au  Fact Sheet for Healthcare Providers:  IncredibleEmployment.be  This test is no t yet approved or cleared by the [place] FDA and  has been authorized for detection and/or diagnosis of SARS-CoV-2 by FDA under an Emergency Use Authorization (EUA). This EUA will remain  in effect (meaning this test can be used) for the duration of the COVID-19 declaration under Section 564(b)(1) of the  Act, 21 U.S.C.section 360bbb-3(b)(1), unless the authorization is terminated  or revoked sooner.       Influenza A by PCR NEGATIVE NEGATIVE Final   Influenza B by PCR NEGATIVE NEGATIVE Final    Comment: (NOTE) The Xpert Xpress SARS-CoV-2/FLU/RSV plus assay is intended as an aid in the diagnosis of influenza from Nasopharyngeal swab specimens and should not be used as a sole basis for treatment. Nasal washings and aspirates are unacceptable for Xpert Xpress SARS-CoV-2/FLU/RSV testing.  Fact Sheet for Patients: EntrepreneurPulse.com.au  Fact Sheet for Healthcare Providers: IncredibleEmployment.be  This test is not yet approved or cleared by the [place] FDA and has been authorized for detection and/or diagnosis of SARS-CoV-2 by FDA under an Emergency Use Authorization (EUA). This EUA will remain in effect (meaning this test can be used) for the duration of the COVID-19 declaration under Section 564(b)(1) of the Act, 21 U.S.C. section 360bbb-3(b)(1), unless the authorization is terminated or revoked.  Performed at [facility], [place] [address] [place], [place] [address]   Resp Panel by RT-PCR (Flu A&B, Covid) Nasopharyngeal Swab     Status: None   Collection Time: 04/25/21  9:37 AM   Specimen: Nasopharyngeal Swab; Nasopharyngeal(NP) swabs in vial transport medium   Result Value Ref Range Status   SARS Coronavirus 2 by RT PCR NEGATIVE NEGATIVE Final    Comment: (NOTE) SARS-CoV-2 target nucleic acids are NOT DETECTED.  The SARS-CoV-2 RNA is generally detectable in upper respiratory specimens during the acute phase of infection. The lowest concentration of SARS-CoV-2 viral copies this assay can detect is 138 copies/mL. A negative result does not preclude SARS-Cov-2 infection and should not be used as the sole basis for treatment or other patient management decisions. A negative result may occur with  improper specimen collection/handling, submission of specimen other than nasopharyngeal swab, presence of viral mutation(s) within the areas targeted by this assay, and inadequate number of viral copies(<138 copies/mL). A negative result must be combined with clinical observations, patient history, and epidemiological information. The expected result is Negative.  Fact Sheet for Patients:  EntrepreneurPulse.com.au  Fact Sheet for Healthcare Providers:  IncredibleEmployment.be  This test is no t yet approved or cleared by the [place] FDA and  has been authorized for detection and/or diagnosis of SARS-CoV-2 by FDA under an Emergency Use Authorization (EUA). This EUA will remain  in effect (meaning this test can be used) for the duration of the COVID-19 declaration under Section 564(b)(1) of the Act, 21 U.S.C.section 360bbb-3(b)(1), unless the authorization is terminated  or revoked sooner.       Influenza A by PCR NEGATIVE NEGATIVE Final   Influenza B by PCR NEGATIVE NEGATIVE Final    Comment: (NOTE) The Xpert Xpress SARS-CoV-2/FLU/RSV plus assay is intended as an aid in the diagnosis of influenza from Nasopharyngeal swab specimens and should not be used as a sole basis for treatment. Nasal washings and aspirates are unacceptable for Xpert Xpress SARS-CoV-2/FLU/RSV testing.  Fact Sheet for  Patients: EntrepreneurPulse.com.au  Fact Sheet for Healthcare Providers: IncredibleEmployment.be  This test is not yet approved or cleared by the [place] FDA and has been authorized for detection and/or diagnosis of SARS-CoV-2 by FDA under an Emergency Use Authorization (EUA). This EUA will remain in effect (meaning this test can be used) for the duration of the COVID-19 declaration under Section 564(b)(1) of the Act, 21 U.S.C. section 360bbb-3(b)(1), unless the authorization is terminated or revoked.  Performed at [facility], [place]  [address], [place], [place] [address]   Resp Panel by RT-PCR (Flu A&B, Covid) Nasopharyngeal Swab     Status: None   Collection Time: 05/01/21  2:35 PM   Specimen: Nasopharyngeal Swab; Nasopharyngeal(NP) swabs in vial transport medium  Result Value Ref Range Status   SARS Coronavirus 2 by RT PCR NEGATIVE NEGATIVE Final    Comment: (NOTE) SARS-CoV-2 target nucleic acids are NOT DETECTED.  The SARS-CoV-2 RNA is generally detectable in upper respiratory specimens during the acute phase of infection. The lowest concentration of SARS-CoV-2 viral copies this assay can detect is 138 copies/mL. A negative result does not preclude SARS-Cov-2 infection and should not be used as the sole basis for treatment or other patient management decisions. A negative result may occur with  improper specimen collection/handling, submission of specimen other than nasopharyngeal swab, presence of viral mutation(s) within the areas targeted by this assay, and inadequate number of viral copies(<138 copies/mL). A negative result must be combined with clinical observations, patient history, and epidemiological information. The expected result is Negative.  Fact Sheet for Patients:  EntrepreneurPulse.com.au  Fact Sheet for Healthcare Providers:  IncredibleEmployment.be  This test is no t yet  approved or cleared by the [place] FDA and  has been authorized for detection and/or diagnosis of SARS-CoV-2 by FDA under an Emergency Use Authorization (EUA). This EUA will remain  in effect (meaning this test can be used) for the duration of the COVID-19 declaration under Section 564(b)(1) of the Act, 21 U.S.C.section 360bbb-3(b)(1), unless the authorization is terminated  or revoked sooner.       Influenza A by PCR NEGATIVE NEGATIVE Final   Influenza B by PCR NEGATIVE NEGATIVE Final    Comment: (NOTE) The Xpert Xpress SARS-CoV-2/FLU/RSV plus assay is intended as an aid in the diagnosis of influenza from Nasopharyngeal swab specimens and should not be used as a sole basis for treatment. Nasal washings and aspirates are unacceptable for Xpert Xpress SARS-CoV-2/FLU/RSV testing.  Fact Sheet for Patients: EntrepreneurPulse.com.au  Fact Sheet for Healthcare Providers: IncredibleEmployment.be  This test is not yet approved or cleared by the [place] FDA and has been authorized for detection and/or diagnosis of SARS-CoV-2 by FDA under an Emergency Use Authorization (EUA). This EUA will remain in effect (meaning this test can be used) for the duration of the COVID-19 declaration under Section 564(b)(1) of the Act, 21 U.S.C. section 360bbb-3(b)(1), unless the authorization is terminated or revoked.  Performed at [facility], [place] [address], [place], [place] [address]      Radiology Studies: CT Head Wo Contrast  Result Date: 05/01/2021 CLINICAL DATA:  Altered mental status EXAM: CT HEAD WITHOUT CONTRAST TECHNIQUE: Contiguous axial images were obtained from the base of the skull through the vertex without intravenous contrast. RADIATION DOSE REDUCTION: This exam was performed according to the departmental dose-optimization program which includes automated exposure control, adjustment of the mA and/or kV according to patient size  and/or use of iterative reconstruction technique. COMPARISON:  CT head 04/22/2021 FINDINGS: Brain: No acute intracranial hemorrhage, mass effect, or herniation. No extra-axial fluid collections. No evidence of acute territorial infarct. No hydrocephalus. Mild-to-moderate cortical volume loss. Patchy hypodensities throughout the periventricular and subcortical white matter, likely secondary to chronic microvascular ischemic changes. 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 Small old infarct in the right cerebellum. Vascular: Calcified plaques in the carotid siphons. Skull: Normal. Negative for fracture or focal lesion. Sinuses/Orbits: No acute finding. Other: None. IMPRESSION: No acute intracranial process  identified. 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 Electronically Signed   By: Ofilia Neas M.D.   On: 05/01/2021 16:31   MR BRAIN WO CONTRAST  Result Date: 05/02/2021 CLINICAL DATA:  81 year old male with End stage renal disease. Delirium. EXAM: MRI HEAD WITHOUT CONTRAST TECHNIQUE: Multiplanar, multiecho pulse sequences of the brain and surrounding structures were obtained without intravenous contrast. COMPARISON:  Head CT yesterday.  Brain MRI 08/13/2016. FINDINGS: Brain: No restricted diffusion or evidence of acute infarction. Small chronic infarcts in the right cerebellum and bilateral thalami are new since 2018. Associated progressed, now Patchy and confluent bilateral cerebral white matter T2 and FLAIR hyperintensity. New T2 heterogeneity in the bilateral basal ganglia. Multiple chronic microhemorrhages in the brain, mostly in and around the thalami. There is also a small focus of chronic hemosiderin in the inferior right parietal lobe. No definite cortical encephalomalacia. No midline shift, mass effect, evidence of mass lesion, ventriculomegaly, extra-axial collection or acute intracranial hemorrhage. Cervicomedullary junction and pituitary are within normal  limits. Vascular: Major intracranial vascular flow voids are stable since 2018, with suspected slow flow in the right sigmoid sinus and IJ bulb, and a degree of generalized intracranial artery tortuosity. Skull and upper cervical spine: Negative for age visible cervical spine. Normal bone marrow signal. Sinuses/Orbits: Postoperative changes to the left globe since 2018. Otherwise stable and negative. Other: Mastoids remain clear. Grossly normal visible internal auditory structures. Chronic midline suboccipital subcutaneous cyst or seroma appears slightly smaller since 2018. IMPRESSION: 1. No acute intracranial abnormality. 2. Advanced chronic small vessel disease, substantially progressed since a 2018 MRI. Electronically Signed   By: Genevie Ann M.D.   On: 05/02/2021 07:10   CT ABDOMEN PELVIS W CONTRAST  Result Date: 05/01/2021 CLINICAL DATA:  Abdominal pain, acute, nonlocalized EXAM: CT ABDOMEN AND PELVIS WITH CONTRAST TECHNIQUE: Multidetector CT imaging of the abdomen and pelvis was performed using the standard protocol following bolus administration of intravenous contrast. RADIATION DOSE REDUCTION: This exam was performed according to the departmental dose-optimization program which includes automated exposure control, adjustment of the mA and/or kV according to patient size and/or use of iterative reconstruction technique. CONTRAST:  82mL OMNIPAQUE IOHEXOL 300 MG/ML  SOLN COMPARISON:  04/22/2021 FINDINGS: Lower chest: Moderate to large right pleural effusion, stable. Right lower lobe atelectasis also stable. Prior TAVR. Coronary artery and aortic calcifications. Hepatobiliary: Gallbladder is distended with layering stones noted. Cholecystostomy tube again noted within the gallbladder, unchanged. No biliary ductal dilatation. No focal hepatic abnormality. Pancreas: No focal abnormality or ductal dilatation. Spleen: No focal abnormality.  Normal size. Adrenals/Urinary Tract: Moderate left hydronephrosis and  severe right hydronephrosis. No visible obstructing process. Findings unchanged since prior study. Urinary bladder grossly unremarkable. Adrenal glands unremarkable. Stomach/Bowel: Previously seen rectal wall thickening not as apparent on today's study. Moderate stool throughout the colon. Nonobstructive bowel gas pattern. Vascular/Lymphatic: Aortoiliac atherosclerosis. No evidence of aneurysm or adenopathy. Reproductive: Radiation seeds in the prostate. Other: No free fluid or free air. Musculoskeletal: No acute bony abnormality. IMPRESSION: Cholelithiasis. Cholecystostomy tube remains in the gallbladder. Mild gallbladder distension. Moderate to large right pleural effusion with right lower lobe atelectasis, stable. Moderate to severe bilateral hydronephrosis, stable since prior study. No obstructing cause visualized. Aortoiliac atherosclerosis. Moderate stool burden throughout the colon. Electronically Signed   By: Rolm Baptise M.D.   On: 05/01/2021 23:07   DG Chest Portable 1 View  Result Date: 05/01/2021 CLINICAL DATA:  Altered mental status, difficulty breathing EXAM: PORTABLE CHEST 1 VIEW COMPARISON:  04/22/2021 FINDINGS: Transverse  diameter of heart is increased. There is opacification of right lower lung fields suggesting right pleural effusion and possibly underlying infiltrate. This finding has not changed significantly. There are no signs of alveolar pulmonary edema or new focal infiltrates. There is no pneumothorax. IMPRESSION: No significant interval changes are noted in the right pleural effusion and infiltrate in the right lower lung fields. Electronically Signed   By: Elmer Picker M.D.   On: 05/01/2021 15:08    Scheduled Meds:  amoxicillin-clavulanate  1 tablet Oral Q0600   atorvastatin  10 mg Oral q AM   brimonidine  1 drop Right Eye TID   carbidopa-levodopa  1 tablet Oral TID   Chlorhexidine Gluconate Cloth  6 each Topical Q0600   clopidogrel  75 mg Oral Q breakfast    dorzolamide-timolol  1 drop Right Eye BID   ferric citrate  420 mg Oral TID WC   LORazepam       Netarsudil-Latanoprost  1 drop Right Eye QHS   pantoprazole  40 mg Oral BID   pilocarpine  1 drop Right Eye QID   senna-docusate  1 tablet Oral QHS   sodium chloride flush  3 mL Intravenous Q12H   Continuous Infusions:  sodium chloride     sodium chloride     sodium chloride       LOS: 1 day   Time spent: 32 minutes  Darliss Cheney, MD [facility]  05/02/2021, 10:43 AM  Please page via [facility] and do not message via secure chat for urgent patient care matters. Secure chat can be used for non urgent patient care matters.  How to contact the [facility] Attending or Consulting provider [place] or covering provider during after hours [place], for this patient?  Check the care team in [facility] and look for a) attending/consulting TR[facility] provider listed and b) the [facility] team listed. Page or secure chat [phone]. Log into [email or web address] and use [place]'s universal password to access. If you do not have the password, please contact the hospital operator. Locate the [facility] provider you are looking for under [facility] and page to a number that you can be directly reached. If you still have difficulty reaching the provider, please page the [facility] (Director on Call) for the Hospitalists listed on amion for assistance.

## 2021-05-02 NOTE — ED Notes (Signed)
Daughter at bedside.

## 2021-05-02 NOTE — Assessment & Plan Note (Signed)
Chronic Stable 

## 2021-05-02 NOTE — Assessment & Plan Note (Signed)
Will consult nephrology in am for HD needs

## 2021-05-02 NOTE — Progress Notes (Signed)
[facility] Progress Note   Subjective:    Seen and examined patient at bedside in ED. Patient's daughter also at bedside. Still remains altered, sleeping, and not currently combative. Responds to voice. Now on 1-point restraint (R wrist only). Reviewed previous notes: looks like received Geodon this AM. Tolerated yesterday's HD with minimal UF (didn't reach UFG). Plan for HD 05/03/21.  Objective Vitals:   05/02/21 1234 05/02/21 1330 05/02/21 1400 05/02/21 1430  BP: (!) 168/94 (!) 155/99 (!) 168/88 (!) 170/89  Pulse: 86 97 87 81  Resp: 15 13 (!) 21 16  Temp:      TempSrc:      SpO2: 100% 100% 99% 100%   Physical Exam General: Ill-appearing; not following commands; noted spontaneous movement; on 3-point restraints Heart: S1 and S2; No murmurs, gallops, or rubs Lungs: Clear anteriorly; No wheezing, rales, or rhonchi Abdomen: Flat and soft; noted perc chole tube in RUQ Extremities: No edema LLE Dialysis Access: L AVF (+) B/T  Filed Weights    Intake/Output Summary (Last 24 hours) at 05/02/2021 1524 Last data filed at 05/02/2021 1130 Gross per 24 hour  Intake --  Output 300 ml  Net -300 ml    Additional Objective Labs: Basic Metabolic Panel: Recent Labs  Lab 05/01/21 1450 05/02/21 0240  NA 144 145  K 3.5 3.5  CL 100 102  CO2 28 30  GLUCOSE 127* 87  BUN 25* 27*  CREATININE 6.14* 6.56*  CALCIUM 8.7* 8.7*   Liver Function Tests: Recent Labs  Lab 05/01/21 1450  AST 21  ALT <5  ALKPHOS 125  BILITOT 0.7  PROT 6.3*  ALBUMIN 2.5*   No results for input(s): LIPASE, AMYLASE in the last 168 hours. CBC: Recent Labs  Lab 05/01/21 1450 05/02/21 0240  WBC 7.7 7.0  NEUTROABS 5.6  --   HGB 9.0* 8.8*  HCT 28.6* 27.3*  MCV 105.5* 105.0*  PLT 282 284   Blood Culture    Component Value Date/Time   SDES BLOOD RIGHT ANTECUBITAL 04/08/2021 0133   SDES BLOOD RIGHT HAND 04/08/2021 0133   SPECREQUEST AEROBIC BOTTLE ONLY Blood Culture adequate volume  04/08/2021 0133   SPECREQUEST AEROBIC BOTTLE ONLY Blood Culture adequate volume 04/08/2021 0133   CULT  04/08/2021 0133    NO GROWTH 5 DAYS Performed at [facility], [place] [address], [place], [place] [address]    CULT  04/08/2021 0133    NO GROWTH 5 DAYS Performed at [facility], [place] [address] [place], [place] [address]    REPTSTATUS 04/13/2021 FINAL 04/08/2021 0133   REPTSTATUS 04/13/2021 FINAL 04/08/2021 0133    Cardiac Enzymes: No results for input(s): CKTOTAL, CKMB, CKMBINDEX, TROPONINI in the last 168 hours. CBG: Recent Labs  Lab 04/25/21 1645 05/01/21 1433  GLUCAP 136* 117*   Iron Studies: No results for input(s): IRON, TIBC, TRANSFERRIN, FERRITIN in the last 72 hours. Lab Results  Component Value Date   INR 1.0 05/01/2021   INR 0.9 04/07/2021   INR 1.2 03/28/2021   Studies/Results: CT Head Wo Contrast  Result Date: 05/01/2021 CLINICAL DATA:  Altered mental status EXAM: CT HEAD WITHOUT CONTRAST TECHNIQUE: Contiguous axial images were obtained from the base of the skull through the vertex without intravenous contrast. RADIATION DOSE REDUCTION: This exam was performed according to the departmental dose-optimization program which includes automated exposure control, adjustment of the mA and/or kV according to patient size and/or use of iterative reconstruction technique. COMPARISON:  CT head 04/22/2021 FINDINGS: Brain:  No acute intracranial hemorrhage, mass effect, or herniation. No extra-axial fluid collections. No evidence of acute territorial infarct. No hydrocephalus. Mild-to-moderate cortical volume loss. Patchy hypodensities throughout the periventricular and subcortical white matter, likely secondary to chronic microvascular ischemic changes. 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 Small old infarct in the right cerebellum. Vascular: Calcified plaques in the carotid siphons. Skull: Normal. Negative for fracture or focal lesion.  Sinuses/Orbits: No acute finding. Other: None. IMPRESSION: No acute intracranial process identified. 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 Electronically Signed   By: Ofilia Neas M.D.   On: 05/01/2021 16:31   MR BRAIN WO CONTRAST  Result Date: 05/02/2021 CLINICAL DATA:  81 year old male with End stage renal disease. Delirium. EXAM: MRI HEAD WITHOUT CONTRAST TECHNIQUE: Multiplanar, multiecho pulse sequences of the brain and surrounding structures were obtained without intravenous contrast. COMPARISON:  Head CT yesterday.  Brain MRI 08/13/2016. FINDINGS: Brain: No restricted diffusion or evidence of acute infarction. Small chronic infarcts in the right cerebellum and bilateral thalami are new since 2018. Associated progressed, now Patchy and confluent bilateral cerebral white matter T2 and FLAIR hyperintensity. New T2 heterogeneity in the bilateral basal ganglia. Multiple chronic microhemorrhages in the brain, mostly in and around the thalami. There is also a small focus of chronic hemosiderin in the inferior right parietal lobe. No definite cortical encephalomalacia. No midline shift, mass effect, evidence of mass lesion, ventriculomegaly, extra-axial collection or acute intracranial hemorrhage. Cervicomedullary junction and pituitary are within normal limits. Vascular: Major intracranial vascular flow voids are stable since 2018, with suspected slow flow in the right sigmoid sinus and IJ bulb, and a degree of generalized intracranial artery tortuosity. Skull and upper cervical spine: Negative for age visible cervical spine. Normal bone marrow signal. Sinuses/Orbits: Postoperative changes to the left globe since 2018. Otherwise stable and negative. Other: Mastoids remain clear. Grossly normal visible internal auditory structures. Chronic midline suboccipital subcutaneous cyst or seroma appears slightly smaller since 2018. IMPRESSION: 1. No acute intracranial abnormality. 2. Advanced chronic small  vessel disease, substantially progressed since a 2018 MRI. Electronically Signed   By: Genevie Ann M.D.   On: 05/02/2021 07:10   CT ABDOMEN PELVIS W CONTRAST  Result Date: 05/01/2021 CLINICAL DATA:  Abdominal pain, acute, nonlocalized EXAM: CT ABDOMEN AND PELVIS WITH CONTRAST TECHNIQUE: Multidetector CT imaging of the abdomen and pelvis was performed using the standard protocol following bolus administration of intravenous contrast. RADIATION DOSE REDUCTION: This exam was performed according to the departmental dose-optimization program which includes automated exposure control, adjustment of the mA and/or kV according to patient size and/or use of iterative reconstruction technique. CONTRAST:  51mL OMNIPAQUE IOHEXOL 300 MG/ML  SOLN COMPARISON:  04/22/2021 FINDINGS: Lower chest: Moderate to large right pleural effusion, stable. Right lower lobe atelectasis also stable. Prior TAVR. Coronary artery and aortic calcifications. Hepatobiliary: Gallbladder is distended with layering stones noted. Cholecystostomy tube again noted within the gallbladder, unchanged. No biliary ductal dilatation. No focal hepatic abnormality. Pancreas: No focal abnormality or ductal dilatation. Spleen: No focal abnormality.  Normal size. Adrenals/Urinary Tract: Moderate left hydronephrosis and severe right hydronephrosis. No visible obstructing process. Findings unchanged since prior study. Urinary bladder grossly unremarkable. Adrenal glands unremarkable. Stomach/Bowel: Previously seen rectal wall thickening not as apparent on today's study. Moderate stool throughout the colon. Nonobstructive bowel gas pattern. Vascular/Lymphatic: Aortoiliac atherosclerosis. No evidence of aneurysm or adenopathy. Reproductive: Radiation seeds in the prostate. Other: No free fluid or free air. Musculoskeletal: No acute bony abnormality. IMPRESSION: Cholelithiasis. Cholecystostomy  tube remains in the gallbladder. Mild gallbladder distension. Moderate to large  right pleural effusion with right lower lobe atelectasis, stable. Moderate to severe bilateral hydronephrosis, stable since prior study. No obstructing cause visualized. Aortoiliac atherosclerosis. Moderate stool burden throughout the colon. Electronically Signed   By: Rolm Baptise M.D.   On: 05/01/2021 23:07   DG Chest Portable 1 View  Result Date: 05/01/2021 CLINICAL DATA:  Altered mental status, difficulty breathing EXAM: PORTABLE CHEST 1 VIEW COMPARISON:  04/22/2021 FINDINGS: Transverse diameter of heart is increased. There is opacification of right lower lung fields suggesting right pleural effusion and possibly underlying infiltrate. This finding has not changed significantly. There are no signs of alveolar pulmonary edema or new focal infiltrates. There is no pneumothorax. IMPRESSION: No significant interval changes are noted in the right pleural effusion and infiltrate in the right lower lung fields. Electronically Signed   By: Elmer Picker M.D.   On: 05/01/2021 15:08    Medications:  sodium chloride     sodium chloride     sodium chloride      [START ON 05/03/2021] amoxicillin-clavulanate  1 tablet Oral Once   atorvastatin  10 mg Oral q AM   brimonidine  1 drop Right Eye TID   carbidopa-levodopa  1 tablet Oral TID   Chlorhexidine Gluconate Cloth  6 each Topical Q0600   clopidogrel  75 mg Oral Q breakfast   dorzolamide-timolol  1 drop Right Eye BID   ferric citrate  420 mg Oral TID WC   LORazepam       Netarsudil-Latanoprost  1 drop Right Eye QHS   pantoprazole  40 mg Oral BID   pilocarpine  1 drop Right Eye QID   QUEtiapine  25 mg Oral Q24H   senna-docusate  1 tablet Oral QHS   sodium chloride flush  3 mL Intravenous Q12H    Dialysis Orders: MWF at [facility]. Lives at [place], 400/500, EDW 65kg, 2K/2Ca, #2, LUE AVF, no heparin - Hectoral 47mcg IV q HD - Sensipar 60mg  PO q HD - Mircera 171mcg IV q 2 weeks (last 2/15)  Assessment/Plan: 1. Acute Encephalopathy:  Currently on 1-point restraint; responds to voice; CT head (-) acute changes but old infarcts. MRI (-) acute process and small vessel disease. U/A ordered; managed by primary 2. Pleural Effusion: Mild; no evidence of new infiltrates; previously treated with Augmentin for aspiration PNA. He is completing course here. 3. ESRD - on HD MWF; Tolerated today's HD with minimal UF. Continue Midodrine. Will continue to run HD at patient's bedside for now. I discussed with daughter that patient will not be able to return to OP HD if agitation persist. Will need to continue to monitor his behavior closely. 4. 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 Secondary hyperparathyroidism - Ca okay, will check PO4 in AM. 6. HTN/volume - Appears euvolemic on exam. Noted Bps trending up-continue Midodrine for now but will need to adjust if hypertension persist 7. Nutrition - NPO for now; can advance to renal diet with fluid restriction once clinically stable. 8. Dispo: Inpatient. Daughter expressed that if patient's medical condition does not improve, she is interested in speaking with palliative care for goals of care discussion and support. I spoke with Dr. Doristine Bosworth who will place palliative care consult.    Tobie Poet, NP [facility] 05/02/2021,3:24 PM  LOS: 1 day

## 2021-05-02 NOTE — Assessment & Plan Note (Addendum)
Blood sugar well controlled with HgbA1c under 5.5.  Will recheck blood glucose with BMP in am Pt NPO

## 2021-05-03 DIAGNOSIS — Z66 Do not resuscitate: Secondary | ICD-10-CM

## 2021-05-03 DIAGNOSIS — Z992 Dependence on renal dialysis: Secondary | ICD-10-CM

## 2021-05-03 DIAGNOSIS — Z7189 Other specified counseling: Secondary | ICD-10-CM

## 2021-05-03 DIAGNOSIS — R52 Pain, unspecified: Secondary | ICD-10-CM

## 2021-05-03 DIAGNOSIS — N186 End stage renal disease: Secondary | ICD-10-CM

## 2021-05-03 DIAGNOSIS — Z515 Encounter for palliative care: Secondary | ICD-10-CM

## 2021-05-03 LAB — HEPATITIS B SURFACE ANTIBODY, QUANTITATIVE: Hep B S AB Quant (Post): 10 m[IU]/mL (ref >9.9)

## 2021-05-03 MED ORDER — ORAL CARE MOUTH RINSE
15.0000 mL | Freq: Two times a day (BID) | OROMUCOSAL | Status: DC
  Administered 2021-05-04 (×2): 15 mL via OROMUCOSAL

## 2021-05-03 MED ORDER — ACETAMINOPHEN 500 MG PO TABS
1000.0000 mg | ORAL_TABLET | Freq: Three times a day (TID) | ORAL | Status: DC
  Administered 2021-05-03 – 2021-05-06 (×7): 1000 mg via ORAL
  Filled 2021-05-03 (×8): qty 2

## 2021-05-03 MED ORDER — SODIUM CHLORIDE 0.9 % IV SOLN: 100.0000 mL | INTRAVENOUS | Status: DC | PRN

## 2021-05-03 MED ORDER — LATANOPROST 0.005 % OP SOLN
1.0000 [drp] | Freq: Every day | OPHTHALMIC | Status: DC
Start: 2021-05-03 — End: 2021-05-06
  Administered 2021-05-05: 1 [drp] via OPHTHALMIC
  Filled 2021-05-03: qty 2.5

## 2021-05-03 MED ORDER — CHLORHEXIDINE GLUCONATE 0.12 % MT SOLN
15.0000 mL | Freq: Two times a day (BID) | OROMUCOSAL | Status: DC
  Administered 2021-05-03 – 2021-05-06 (×5): 15 mL via OROMUCOSAL
  Filled 2021-05-03 (×5): qty 15

## 2021-05-03 MED ORDER — CHLORHEXIDINE GLUCONATE CLOTH 2 % EX PADS
6.0000 | MEDICATED_PAD | Freq: Every day | CUTANEOUS | Status: DC
  Administered 2021-05-04 – 2021-05-06 (×3): 6 via TOPICAL

## 2021-05-03 NOTE — Progress Notes (Signed)
Manufacturing engineer [facility]) Hospital Liaison Note ? ?This patient is currently enrolled in [facility] outpatient-based palliative care.  ? ?[facility] will continue to follow for discharge disposition.  ? ?Please call for any outpatient based palliative care related questions or concerns.  ? ?Thank you.  ? ?Roselee Nova, LCSW ?[facility] Liaison ?[phone] ? ?

## 2021-05-03 NOTE — Progress Notes (Signed)
?  Transition of Care (TOC) Screening Note ? ? ?Patient Details  ?Name: Elijah White ?Date of Birth: Apr 25, 1940 ? ? ?Transition of Care (TOC) CM/SW Contact:    ?Benard Halsted, LCSW ?Phone Number: ?05/03/2021, 1:41 PM ? ? ? ?Transition of Care Department [facility]) has reviewed patient. We will continue to monitor patient advancement through interdisciplinary progression rounds. If new patient transition needs arise, please place a TOC consult. ? ? ?

## 2021-05-03 NOTE — Consult Note (Signed)
Consultation Note Date: 05/03/2021   Patient Name: Elijah White  DOB: 12/11/1940  MRN: [id number]  Age / Sex: 81 y.o., male  PCP: Maryella Shivers, MD Referring Physician: Darliss Cheney, MD  Reason for Consultation: Establishing goals of care  HPI/Patient Profile: 81 y.o. 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 Was given 2 doses of geodon in ED d/t agitation. CT and MRI negative. Started on seroquel and has been more calm. PMT consulted to discuss [place].    Clinical Assessment and Goals of Care: I have reviewed medical records including EPIC notes, labs and imaging, assessed the patient and then met with patient, daughter, and son  to discuss diagnosis prognosis, GOC, EOL wishes, disposition and options.  Initially met with patient - he does not open eyes to voice but responds to my questions. Tells me HD is going well, tells me he would like to continue HD. Complains of generalized pain. Tells me I can call his daughter for further discussions.   I introduced Palliative Medicine as specialized medical care for people living with serious illness. It focuses on providing relief from the symptoms and stress of a serious illness. The goal is to improve quality of life for both the patient and the family.  Family tells me they believe he was doing well at [facility] but has had decline in recent months with multiple hospitalizations.    We discussed patient's current illness and what it means in the larger context of patient's on-going co-morbidities.  Natural disease trajectory and expectations at EOL were discussed. We discussed concern about patient being able to tolerate HD.  Discussed that mental status seems better today and plan to attempt HD in dialysis unit 3/2. Discussed plan of care will likely depend on how he tolerates HD in unit. We discuss there has been concern if he will be able to continue outpatient dialysis d/t agitation previously.   I attempted to elicit values and goals of care important to the patient.    Family agrees to plan of trial of HD in unit tomorrow. Agree to further goals of care discussions if he does not tolerate. We discuss plan to follow up with palliative following HD session.   Discussed with family the importance of continued conversation with family and the medical providers regarding overall plan of care and treatment options, ensuring decisions are within the context of the patients values and GOCs.    Palliative Care services outpatient were explained and offered. Family is interested in continuing outpatient palliative.   Questions and concerns were addressed. The family was encouraged to call with questions or concerns.   Please note patient does have a MOST form completed with outpatient palliative team that reflects the following:  Cardiopulmonary Resuscitation: Do Not Attempt Resuscitation (DNR/No CPR)  Medical Interventions: Limited Additional Interventions: Use medical treatment, IV fluids and cardiac monitoring as indicated, DO NOT USE intubation or mechanical ventilation. May consider use of less invasive airway support such as BiPAP or CPAP. Also provide comfort measures. Transfer to the hospital if indicated. Avoid intensive care.   Antibiotics: Antibiotics if indicated  IV Fluids: IV fluids if indicated  Feeding Tube: No feeding tube    Primary Decision Maker HCPOA -daughter [name]    SUMMARY OF  RECOMMENDATIONS   - MOST as above - patient established DNR, okay with limited medical interventions, no tube feeding - reconnect to outpatient palliative services -message sent to liaison - plan for HD 3/2 - will  f/u afterwards - if tolerates well, family okay with dc back to [facility] - if does not tolerate will need additional goals of care conversations - patient with c/o of generalized pain - will schedule acetaminophen TID  Code Status/Advance Care Planning: DNR     Primary Diagnoses: Present on Admission:  Encephalopathy acute  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 The following aspects are pertinent.  Past Medical History:  Diagnosis Date   Anemia    low iron   Aortic stenosis    s/p TAVR 01/19/20   Arthritis    Cancer [facility])    prostate, s/p I-125 seed implant 05/18/05   Colon polyps ~ 1993 and 2003   Dr [name], [facility].  08/2001 colonoscopy: tubular adenoma at cecum.     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   Elevated cholesterol with high triglycerides    ESRD (end stage renal disease) (HCC)    [facility]   GERD (gastroesophageal reflux disease)    Glaucoma    Hypertension    Legally blind in left eye, as defined in [place]    has pinpoint vision in right eye   PAD (peripheral artery disease) ([place])    RBBB    Social History   Socioeconomic History   Marital status: Widowed    Spouse name: Not on file   Number of children: Not on file   Years of education: Not on file   Highest education level: Not on file  Occupational History   Occupation: retired  Tobacco Use   Smoking status: Former    Packs/day: 0.50    Types: Cigarettes    Quit date: 03/23/2018    Years since quitting: 3.1   Smokeless tobacco: Never  Vaping Use   Vaping Use: Never used  Substance and Sexual Activity   Alcohol use: No   Drug use: No   Sexual activity: Not Currently  Other Topics Concern   Not on file  Social History  Narrative   Right handed    Facility   Social Determinants of Health   Financial Resource Strain: Low Risk    Difficulty of Paying Living Expenses: Not hard at all  Food Insecurity: No Food Insecurity   Worried About Charity fundraiser in the Last Year: Never true   [place] in the Last Year: Never true  Transportation Needs: No Transportation Needs   Lack of Transportation (Medical): No   Lack of Transportation (Non-Medical): No  Physical Activity: Inactive   Days of Exercise per Week: 0 days   Minutes of Exercise per Session: 0 min  Stress: No Stress Concern Present   Feeling of Stress : Not at all  Social Connections: Not on file   Family History  Problem Relation Age of Onset   CAD Mother    Scheduled Meds:  acetaminophen  1,000 mg Oral TID   atorvastatin  10 mg Oral q AM   brimonidine  1 drop Right Eye TID   carbidopa-levodopa  1 tablet Oral  TID   chlorhexidine  15 mL Mouth Rinse BID   clopidogrel  75 mg Oral Q breakfast   dorzolamide-timolol  1 drop Right Eye BID   ferric citrate  420 mg Oral TID WC   latanoprost  1 drop Right Eye QHS   mouth rinse  15 mL Mouth Rinse q12n4p   pantoprazole  40 mg Oral BID   pilocarpine  1 drop Right Eye QID   QUEtiapine  25 mg Oral Q24H   senna-docusate  1 tablet Oral QHS   sodium chloride flush  3 mL Intravenous Q12H   Continuous Infusions:  sodium chloride     sodium chloride     sodium chloride     PRN Meds:.sodium chloride, sodium chloride, sodium chloride, alteplase, eye wash, heparin, lidocaine (PF), lidocaine-prilocaine, LORazepam, metoCLOPramide, midodrine, pentafluoroprop-tetrafluoroeth, sodium chloride flush No Known Allergies Review of Systems  Unable to perform ROS: Mental status change   Physical Exam Constitutional:      General: He is not in acute distress.    Comments: Responsive but does not open eyes - answers yes/no questions appropriately  Pulmonary:     Effort: Pulmonary effort is normal.   Skin:    General: Skin is warm and dry.    Vital Signs: BP 137/80 (BP Location: Right Arm)    Pulse 100    Temp 97.9 F (36.6 C) (Oral)    Resp 17    SpO2 100%  Pain Scale: 0-10   Pain Score: 0-No pain   SpO2: SpO2: 100 % O2 Device:SpO2: 100 % O2 Flow Rate: .   IO: Intake/output summary:  Intake/Output Summary (Last 24 hours) at 05/03/2021 1449 Last data filed at 05/03/2021 0938 Gross per 24 hour  Intake 3 ml  Output 120 ml  Net -117 ml    LBM: Last BM Date : 05/03/21 Baseline Weight: Weight:  (strtetcher n/a) Most recent weight: Weight:  (strtetcher n/a)     Palliative Assessment/Data: PPS 40%     Juel Burrow, DNP, AGNP-C Palliative Medicine Team [phone] Pager: [phone]

## 2021-05-03 NOTE — Progress Notes (Signed)
[facility] Progress Note   Subjective:    Seen and examined patient at bedside. Currently off restraints and more calm. Looks like Seroquel was given overnight. Responding to simple questions. Received HD yesterday with minimal UF d/t episodes of hypotension. Plan for HD on Thursday 3/2.  Objective Vitals:   05/02/21 2300 05/02/21 2304 05/03/21 0337 05/03/21 0839  BP:  (!) 152/80 116/68 130/76  Pulse: 89 90 86 100  Resp: 14 17 17    Temp:  97.8 F (36.6 C) 97.8 F (36.6 C) 98.3 F (36.8 C)  TempSrc:  Oral Oral Oral  SpO2: 98% 97% 93% 100%   Physical Exam General: Ill-appearing; responds to simple commands; restraints NOT in use Heart: S1 and S2; No murmurs, gallops, or rubs Lungs: Clear anteriorly; No wheezing, rales, or rhonchi Abdomen: Flat and soft; noted perc chole tube in RUQ Extremities: No edema LLE Dialysis Access: L AVF (+) B/T  Filed Weights    Intake/Output Summary (Last 24 hours) at 05/03/2021 1201 Last data filed at 05/03/2021 0865 Gross per 24 hour  Intake 3 ml  Output 120 ml  Net -117 ml    Additional Objective Labs: Basic Metabolic Panel: Recent Labs  Lab 05/01/21 1450 05/02/21 0240  NA 144 145  K 3.5 3.5  CL 100 102  CO2 28 30  GLUCOSE 127* 87  BUN 25* 27*  CREATININE 6.14* 6.56*  CALCIUM 8.7* 8.7*   Liver Function Tests: Recent Labs  Lab 05/01/21 1450  AST 21  ALT <5  ALKPHOS 125  BILITOT 0.7  PROT 6.3*  ALBUMIN 2.5*   No results for input(s): LIPASE, AMYLASE in the last 168 hours. CBC: Recent Labs  Lab 05/01/21 1450 05/02/21 0240  WBC 7.7 7.0  NEUTROABS 5.6  --   HGB 9.0* 8.8*  HCT 28.6* 27.3*  MCV 105.5* 105.0*  PLT 282 284   Blood Culture    Component Value Date/Time   SDES BLOOD RIGHT ANTECUBITAL 04/08/2021 0133   SDES BLOOD RIGHT HAND 04/08/2021 0133   SPECREQUEST AEROBIC BOTTLE ONLY Blood Culture adequate volume 04/08/2021 0133   SPECREQUEST AEROBIC BOTTLE ONLY Blood Culture adequate volume  04/08/2021 0133   CULT  04/08/2021 0133    NO GROWTH 5 DAYS Performed at [facility], [place] [address], [place], [place] [address]    CULT  04/08/2021 0133    NO GROWTH 5 DAYS Performed at [facility], [place] [address], [place], [place] [address]    REPTSTATUS 04/13/2021 FINAL 04/08/2021 0133   REPTSTATUS 04/13/2021 FINAL 04/08/2021 0133    Cardiac Enzymes: No results for input(s): CKTOTAL, CKMB, CKMBINDEX, TROPONINI in the last 168 hours. CBG: Recent Labs  Lab 05/01/21 1433  GLUCAP 117*   Iron Studies: No results for input(s): IRON, TIBC, TRANSFERRIN, FERRITIN in the last 72 hours. Lab Results  Component Value Date   INR 1.0 05/01/2021   INR 0.9 04/07/2021   INR 1.2 03/28/2021   Studies/Results: CT Head Wo Contrast  Result Date: 05/01/2021 CLINICAL DATA:  Altered mental status EXAM: CT HEAD WITHOUT CONTRAST TECHNIQUE: Contiguous axial images were obtained from the base of the skull through the vertex without intravenous contrast. RADIATION DOSE REDUCTION: This exam was performed according to the departmental dose-optimization program which includes automated exposure control, adjustment of the mA and/or kV according to patient size and/or use of iterative reconstruction technique. COMPARISON:  CT head 04/22/2021 FINDINGS: Brain: No acute intracranial hemorrhage, mass effect, or herniation. No extra-axial fluid collections. No  evidence of acute territorial infarct. No hydrocephalus. Mild-to-moderate cortical volume loss. Patchy hypodensities throughout the periventricular and subcortical white matter, likely secondary to chronic microvascular ischemic changes. 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 Small old infarct in the right cerebellum. Vascular: Calcified plaques in the carotid siphons. Skull: Normal. Negative for fracture or focal lesion. Sinuses/Orbits: No acute finding. Other: None. IMPRESSION: No acute intracranial process identified.  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 Electronically Signed   By: Ofilia Neas M.D.   On: 05/01/2021 16:31   MR BRAIN WO CONTRAST  Result Date: 05/02/2021 CLINICAL DATA:  81 year old male with End stage renal disease. Delirium. EXAM: MRI HEAD WITHOUT CONTRAST TECHNIQUE: Multiplanar, multiecho pulse sequences of the brain and surrounding structures were obtained without intravenous contrast. COMPARISON:  Head CT yesterday.  Brain MRI 08/13/2016. FINDINGS: Brain: No restricted diffusion or evidence of acute infarction. Small chronic infarcts in the right cerebellum and bilateral thalami are new since 2018. Associated progressed, now Patchy and confluent bilateral cerebral white matter T2 and FLAIR hyperintensity. New T2 heterogeneity in the bilateral basal ganglia. Multiple chronic microhemorrhages in the brain, mostly in and around the thalami. There is also a small focus of chronic hemosiderin in the inferior right parietal lobe. No definite cortical encephalomalacia. No midline shift, mass effect, evidence of mass lesion, ventriculomegaly, extra-axial collection or acute intracranial hemorrhage. Cervicomedullary junction and pituitary are within normal limits. Vascular: Major intracranial vascular flow voids are stable since 2018, with suspected slow flow in the right sigmoid sinus and IJ bulb, and a degree of generalized intracranial artery tortuosity. Skull and upper cervical spine: Negative for age visible cervical spine. Normal bone marrow signal. Sinuses/Orbits: Postoperative changes to the left globe since 2018. Otherwise stable and negative. Other: Mastoids remain clear. Grossly normal visible internal auditory structures. Chronic midline suboccipital subcutaneous cyst or seroma appears slightly smaller since 2018. IMPRESSION: 1. No acute intracranial abnormality. 2. Advanced chronic small vessel disease, substantially progressed since a 2018 MRI. Electronically Signed   By: Genevie Ann M.D.    On: 05/02/2021 07:10   CT ABDOMEN PELVIS W CONTRAST  Result Date: 05/01/2021 CLINICAL DATA:  Abdominal pain, acute, nonlocalized EXAM: CT ABDOMEN AND PELVIS WITH CONTRAST TECHNIQUE: Multidetector CT imaging of the abdomen and pelvis was performed using the standard protocol following bolus administration of intravenous contrast. RADIATION DOSE REDUCTION: This exam was performed according to the departmental dose-optimization program which includes automated exposure control, adjustment of the mA and/or kV according to patient size and/or use of iterative reconstruction technique. CONTRAST:  51mL OMNIPAQUE IOHEXOL 300 MG/ML  SOLN COMPARISON:  04/22/2021 FINDINGS: Lower chest: Moderate to large right pleural effusion, stable. Right lower lobe atelectasis also stable. Prior TAVR. Coronary artery and aortic calcifications. Hepatobiliary: Gallbladder is distended with layering stones noted. Cholecystostomy tube again noted within the gallbladder, unchanged. No biliary ductal dilatation. No focal hepatic abnormality. Pancreas: No focal abnormality or ductal dilatation. Spleen: No focal abnormality.  Normal size. Adrenals/Urinary Tract: Moderate left hydronephrosis and severe right hydronephrosis. No visible obstructing process. Findings unchanged since prior study. Urinary bladder grossly unremarkable. Adrenal glands unremarkable. Stomach/Bowel: Previously seen rectal wall thickening not as apparent on today's study. Moderate stool throughout the colon. Nonobstructive bowel gas pattern. Vascular/Lymphatic: Aortoiliac atherosclerosis. No evidence of aneurysm or adenopathy. Reproductive: Radiation seeds in the prostate. Other: No free fluid or free air. Musculoskeletal: No acute bony abnormality. IMPRESSION: Cholelithiasis. Cholecystostomy tube remains in the gallbladder. Mild gallbladder distension. Moderate to large right pleural  effusion with right lower lobe atelectasis, stable. Moderate to severe bilateral  hydronephrosis, stable since prior study. No obstructing cause visualized. Aortoiliac atherosclerosis. Moderate stool burden throughout the colon. Electronically Signed   By: Rolm Baptise M.D.   On: 05/01/2021 23:07   DG Chest Portable 1 View  Result Date: 05/01/2021 CLINICAL DATA:  Altered mental status, difficulty breathing EXAM: PORTABLE CHEST 1 VIEW COMPARISON:  04/22/2021 FINDINGS: Transverse diameter of heart is increased. There is opacification of right lower lung fields suggesting right pleural effusion and possibly underlying infiltrate. This finding has not changed significantly. There are no signs of alveolar pulmonary edema or new focal infiltrates. There is no pneumothorax. IMPRESSION: No significant interval changes are noted in the right pleural effusion and infiltrate in the right lower lung fields. Electronically Signed   By: Elmer Picker M.D.   On: 05/01/2021 15:08    Medications:  sodium chloride     sodium chloride     sodium chloride      atorvastatin  10 mg Oral q AM   brimonidine  1 drop Right Eye TID   carbidopa-levodopa  1 tablet Oral TID   chlorhexidine  15 mL Mouth Rinse BID   clopidogrel  75 mg Oral Q breakfast   dorzolamide-timolol  1 drop Right Eye BID   ferric citrate  420 mg Oral TID WC   latanoprost  1 drop Right Eye QHS   mouth rinse  15 mL Mouth Rinse q12n4p   pantoprazole  40 mg Oral BID   pilocarpine  1 drop Right Eye QID   QUEtiapine  25 mg Oral Q24H   senna-docusate  1 tablet Oral QHS   sodium chloride flush  3 mL Intravenous Q12H    Dialysis Orders: MWF at [facility]. Lives at [place], 400/500, EDW 65kg, 2K/2Ca, #2, LUE AVF, no heparin - Hectoral 56mcg IV q HD - Sensipar 60mg  PO q HD - Mircera 139mcg IV q 2 weeks (last 2/15)  Assessment/Plan: 1. Acute Encephalopathy: Currently OFF restraints; responds to simple questions; CT head (-) acute changes but old infarcts. MRI (-) acute process and small vessel disease. U/A ordered; w/u  for delirium-managed by primary 2. Pleural Effusion: Mild; no evidence of new infiltrates; previously treated with Augmentin for aspiration PNA. He is completing course here. 3. ESRD - on HD MWF; Tolerated yesterday's HD with minimal UF. Continue Midodrine. Since off restraints, will try to complete treatment on HD unit-plan for HD 3/2. I discussed with daughter that patient will not be able to return to OP HD if agitation persist. Will need to continue to monitor his behavior closely. 4. 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 Secondary hyperparathyroidism - Ca okay, will check PO4 in AM. 6. HTN/volume - Appears euvolemic on exam. Noted Bps variable-continue Midodrine for now but will need to adjust if hypertension persist 7. Nutrition - NPO for now; can advance to renal diet with fluid restriction once clinically stable. 8. Dispo: Inpatient. Daughter expressed that if patient's medical condition does not improve, she is interested in speaking with palliative care for goals of care discussion and support. I spoke with Dr. Doristine Bosworth who will place palliative care consult.  Tobie Poet, NP [facility] 05/03/2021,12:01 PM  LOS: 2 days

## 2021-05-03 NOTE — Progress Notes (Addendum)
PROGRESS NOTE    Elijah White  VEL:[id number] DOB: 1941-02-20 DOA: 05/01/2021 PCP: Maryella Shivers, MD   Brief Narrative:  Elijah White is a 81 y.o. 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 One hour into his session he became agitated and was aggressive to staff. He was biting, scratching, trying to hit staff.  HD was stopped and he was sent to the emergency room.  He remained agitated in the emergency room and was given 2 doses of Geodon.  CT of the head was obtained which was negative for acute pathology.  He remained altered in the emergency room.  His ammonia level was normal.  Percutaneous drain for his chronic cholecystitis is still in place.  He is afebrile.  There is no report of seizure activity. Encephalopathy of an unclear etiology by the brain has been ordered to further evaluate and make sure no CVA has occurred causing the cephalopathy Hospitalist service been asked to admit for further management. Patient's had a complicated course with multiple admissions over the last few months.  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  Patient appears to be have delirium.  Gave him Seroquel last night, he was more awake  and talkative today.  Kept his eyes closed.  He was partly oriented.  1. Avoid benzodiazepines, antihistamines, anticholinergics, and minimize opiate use as these may worsen delirium. 2: Assess, prevent and manage pain as lack of treatment can result in delirium.  3: Provide appropriate lighting and clear signage; a clock and calendar should be easily visible to the patient. 4: Monitor environmental factors. Reduce light and noise at night (close shades, turn off lights, turn off TV, ect). Correct any alterations in sleep cycle. 5: Reorient the patient to person, place, time and situation on each encounter.  6: Correct sensory deficits if possible (replace eye glasses, hearing aids, ect). 7: Avoid restraints if able. Severely delirious patients benefit from constant observation by a sitter.  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 [facility]): Nephrology on board.   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  No need of SSI.   Hypotension: I do not see any history of hypertension and is not on any hypertensives as well.  In fact his blood pressure is low and he is on midodrine which I am going to resume.   Anemia in chronic kidney disease- (present on admission): Stable   Pleural effusion, right: Very mild.  Does not need thoracentesis as he is not hypoxic and he is getting dialysis so volume should be handled by HD.  X-ray also indicates possible right lower lobe infiltrates but I doubt pneumonia.  Reportedly he was diagnosed with aspiration pneumonia as outpatient and prescribed  Augmentin on 04/25/2021 which we will continue and only 1 day more dose left.  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 Stable.   Prolonged QT interval Avoid medications which can further prolong QT interval  DVT prophylaxis: SCDs Start: 05/02/21 0232   Code Status: DNR  Family Communication:  None  present at bedside.  Will discuss with daughter later.   Status is: Inpatient Remains inpatient appropriate because: Very encephalopathic and obtunded.  Estimated body mass index is 24.4 kg/m as calculated from the following:   Height as of 04/23/21: 5\' 5"  (1.651 m).   Weight as of 04/25/21: 66.5 kg.  Pressure Injury 03/28/21 Coccyx Medial Stage 2 -  Partial thickness loss of dermis presenting as a shallow open injury with a red, pink wound bed without slough. wound bed is pink, no drainage noted (Active)  03/28/21 1028  Location: Coccyx  Location Orientation: Medial  Staging: Stage 2 -  Partial thickness loss of dermis presenting as a shallow open injury with a red, pink wound bed without slough.  Wound Description (Comments): wound bed is pink, no drainage noted  Present on Admission: Yes     Pressure Injury 04/23/21 Coccyx Medial Stage 3 -  Full thickness tissue loss. Subcutaneous fat may be visible but bone, tendon or muscle are NOT exposed. (Active)  04/23/21 0200  Location: Coccyx  Location Orientation: Medial  Staging: Stage 3 -  Full thickness tissue loss. Subcutaneous fat may be visible but bone, tendon or muscle are NOT exposed.  Wound Description (Comments):   Present on Admission: Yes   Nutritional Assessment: There is no height or weight on file to calculate BMI.. Seen by dietician.  I agree with the assessment and plan as outlined below: Nutrition Status:        . Skin Assessment: I have examined the patient's skin and I agree with the wound assessment as performed by the wound care RN as outlined below: Pressure Injury 03/28/21 Coccyx Medial Stage 2 -  Partial thickness loss of dermis presenting as a shallow open injury with a red, pink wound bed without slough. wound bed is pink, no drainage noted (Active)  03/28/21 1028  Location: Coccyx  Location Orientation: Medial  Staging: Stage 2 -  Partial thickness loss of dermis presenting as a shallow open injury with  a red, pink wound bed without slough.  Wound Description (Comments): wound bed is pink, no drainage noted  Present on Admission: Yes     Pressure Injury 04/23/21 Coccyx Medial Stage 3 -  Full thickness tissue loss. Subcutaneous fat may be visible but bone, tendon or muscle are NOT exposed. (Active)  04/23/21 0200  Location: Coccyx  Location Orientation: Medial  Staging: Stage 3 -  Full thickness tissue loss. Subcutaneous fat may be visible but bone, tendon or muscle are NOT exposed.  Wound Description (Comments):   Present on Admission: Yes    Consultants:  Nephrology  Procedures:  None  Antimicrobials:  Anti-infectives (From admission, onward)    Start     Dose/Rate Route Frequency Ordered Stop   05/03/21 1200  amoxicillin-clavulanate (AUGMENTIN) 500-125 MG per tablet 500 mg        1 tablet Oral  Once 05/02/21 0746           Subjective: Seen and examined.  Patient more alert compared to yesterday.  Talkative as well.  Although he is confused.  Appears comfortable.  Objective: Vitals:   05/02/21 2300 05/02/21 2304 05/03/21 0337 05/03/21 0839  BP:  [name])  152/80 116/68 130/76  Pulse: 89 90 86 100  Resp: 14 17 17    Temp:  97.8 F (36.6 C) 97.8 F (36.6 C) 98.3 F (36.8 C)  TempSrc:  Oral Oral Oral  SpO2: 98% 97% 93% 100%    Intake/Output Summary (Last 24 hours) at 05/03/2021 1031 Last data filed at 05/03/2021 2952 Gross per 24 hour  Intake 3 ml  Output 420 ml  Net -417 ml   Filed Weights    Examination:  General exam: Appears calm and comfortable  Respiratory system: Clear to auscultation. Respiratory effort normal. Cardiovascular system: S1 & S2 heard, RRR. No JVD, murmurs, rubs, gallops or clicks. No pedal edema. Gastrointestinal system: Abdomen is nondistended, soft and nontender. No organomegaly or masses felt. Normal bowel sounds heard. Central nervous system: Alert but not oriented.  No focal deficit.  Moving all extremities spontaneously.  Data Reviewed:  I have personally reviewed following labs and imaging studies  CBC: Recent Labs  Lab 05/01/21 1450 05/02/21 0240  WBC 7.7 7.0  NEUTROABS 5.6  --   HGB 9.0* 8.8*  HCT 28.6* 27.3*  MCV 105.5* 105.0*  PLT 282 841    Basic Metabolic Panel: Recent Labs  Lab 05/01/21 1450 05/02/21 0240  NA 144 145  K 3.5 3.5  CL 100 102  CO2 28 30  GLUCOSE 127* 87  BUN 25* 27*  CREATININE 6.14* 6.56*  CALCIUM 8.7* 8.7*    GFR: Estimated Creatinine Clearance: 7.6 mL/min (A) (by C-G formula based on SCr of 6.56 mg/dL (H)). Liver Function Tests: Recent Labs  Lab 05/01/21 1450  AST 21  ALT <5  ALKPHOS 125  BILITOT 0.7  PROT 6.3*  ALBUMIN 2.5*    No results for input(s): LIPASE, AMYLASE in the last 168 hours. Recent Labs  Lab 05/01/21 1450  AMMONIA 20    Coagulation Profile: Recent Labs  Lab 05/01/21 1450  INR 1.0    Cardiac Enzymes: No results for input(s): CKTOTAL, CKMB, CKMBINDEX, TROPONINI in the last 168 hours. BNP (last 3 results) No results for input(s): PROBNP in the last 8760 hours. HbA1C: No results for input(s): HGBA1C in the last 72 hours. CBG: Recent Labs  Lab 05/01/21 1433  GLUCAP 117*    Lipid Profile: No results for input(s): CHOL, HDL, LDLCALC, TRIG, CHOLHDL, LDLDIRECT in the last 72 hours. Thyroid Function Tests: No results for input(s): TSH, T4TOTAL, FREET4, T3FREE, THYROIDAB in the last 72 hours. Anemia Panel: No results for input(s): VITAMINB12, FOLATE, FERRITIN, TIBC, IRON, RETICCTPCT in the last 72 hours. Sepsis Labs: Recent Labs  Lab 05/01/21 1450  LATICACIDVEN 1.1     Recent Results (from the past 240 hour(s))  Resp Panel by RT-PCR (Flu A&B, Covid) Nasopharyngeal Swab     Status: None   Collection Time: 04/25/21  9:37 AM   Specimen: Nasopharyngeal Swab; Nasopharyngeal(NP) swabs in vial transport medium  Result Value Ref Range Status   SARS Coronavirus 2 by RT PCR NEGATIVE NEGATIVE Final    Comment: (NOTE) SARS-CoV-2 target  nucleic acids are NOT DETECTED.  The SARS-CoV-2 RNA is generally detectable in upper respiratory specimens during the acute phase of infection. The lowest concentration of SARS-CoV-2 viral copies this assay can detect is 138 copies/mL. A negative result does not preclude SARS-Cov-2 infection and should not be used as the sole basis for treatment or other patient management decisions. A negative result may occur with  improper specimen collection/handling, submission of specimen other than nasopharyngeal swab, presence of viral mutation(s) within the  areas targeted by this assay, and inadequate number of viral copies(<138 copies/mL). A negative result must be combined with clinical observations, patient history, and epidemiological information. The expected result is Negative.  Fact Sheet for Patients:  EntrepreneurPulse.com.au  Fact Sheet for Healthcare Providers:  IncredibleEmployment.be  This test is no t yet approved or cleared by the [place] FDA and  has been authorized for detection and/or diagnosis of SARS-CoV-2 by FDA under an Emergency Use Authorization (EUA). This EUA will remain  in effect (meaning this test can be used) for the duration of the COVID-19 declaration under Section 564(b)(1) of the Act, 21 U.S.C.section 360bbb-3(b)(1), unless the authorization is terminated  or revoked sooner.       Influenza A by PCR NEGATIVE NEGATIVE Final   Influenza B by PCR NEGATIVE NEGATIVE Final    Comment: (NOTE) The Xpert Xpress SARS-CoV-2/FLU/RSV plus assay is intended as an aid in the diagnosis of influenza from Nasopharyngeal swab specimens and should not be used as a sole basis for treatment. Nasal washings and aspirates are unacceptable for Xpert Xpress SARS-CoV-2/FLU/RSV testing.  Fact Sheet for Patients: EntrepreneurPulse.com.au  Fact Sheet for Healthcare  Providers: IncredibleEmployment.be  This test is not yet approved or cleared by the [place] FDA and has been authorized for detection and/or diagnosis of SARS-CoV-2 by FDA under an Emergency Use Authorization (EUA). This EUA will remain in effect (meaning this test can be used) for the duration of the COVID-19 declaration under Section 564(b)(1) of the Act, 21 U.S.C. section 360bbb-3(b)(1), unless the authorization is terminated or revoked.  Performed at [facility], [place] [address] [place], [place] [address]   Resp Panel by RT-PCR (Flu A&B, Covid) Nasopharyngeal Swab     Status: None   Collection Time: 05/01/21  2:35 PM   Specimen: Nasopharyngeal Swab; Nasopharyngeal(NP) swabs in vial transport medium  Result Value Ref Range Status   SARS Coronavirus 2 by RT PCR NEGATIVE NEGATIVE Final    Comment: (NOTE) SARS-CoV-2 target nucleic acids are NOT DETECTED.  The SARS-CoV-2 RNA is generally detectable in upper respiratory specimens during the acute phase of infection. The lowest concentration of SARS-CoV-2 viral copies this assay can detect is 138 copies/mL. A negative result does not preclude SARS-Cov-2 infection and should not be used as the sole basis for treatment or other patient management decisions. A negative result may occur with  improper specimen collection/handling, submission of specimen other than nasopharyngeal swab, presence of viral mutation(s) within the areas targeted by this assay, and inadequate number of viral copies(<138 copies/mL). A negative result must be combined with clinical observations, patient history, and epidemiological information. The expected result is Negative.  Fact Sheet for Patients:  EntrepreneurPulse.com.au  Fact Sheet for Healthcare Providers:  IncredibleEmployment.be  This test is no t yet approved or cleared by the [place] FDA and  has been authorized for  detection and/or diagnosis of SARS-CoV-2 by FDA under an Emergency Use Authorization (EUA). This EUA will remain  in effect (meaning this test can be used) for the duration of the COVID-19 declaration under Section 564(b)(1) of the Act, 21 U.S.C.section 360bbb-3(b)(1), unless the authorization is terminated  or revoked sooner.       Influenza A by PCR NEGATIVE NEGATIVE Final   Influenza B by PCR NEGATIVE NEGATIVE Final    Comment: (NOTE) The Xpert Xpress SARS-CoV-2/FLU/RSV plus assay is intended as an aid in the diagnosis of influenza from Nasopharyngeal swab specimens and should not be used as a sole  basis for treatment. Nasal washings and aspirates are unacceptable for Xpert Xpress SARS-CoV-2/FLU/RSV testing.  Fact Sheet for Patients: EntrepreneurPulse.com.au  Fact Sheet for Healthcare Providers: IncredibleEmployment.be  This test is not yet approved or cleared by the [place] FDA and has been authorized for detection and/or diagnosis of SARS-CoV-2 by FDA under an Emergency Use Authorization (EUA). This EUA will remain in effect (meaning this test can be used) for the duration of the COVID-19 declaration under Section 564(b)(1) of the Act, 21 U.S.C. section 360bbb-3(b)(1), unless the authorization is terminated or revoked.  Performed at [facility], [place] [address] [place], [place] [address]       Radiology Studies: CT Head Wo Contrast  Result Date: 05/01/2021 CLINICAL DATA:  Altered mental status EXAM: CT HEAD WITHOUT CONTRAST TECHNIQUE: Contiguous axial images were obtained from the base of the skull through the vertex without intravenous contrast. RADIATION DOSE REDUCTION: This exam was performed according to the departmental dose-optimization program which includes automated exposure control, adjustment of the mA and/or kV according to patient size and/or use of iterative reconstruction technique. COMPARISON:  CT head  04/22/2021 FINDINGS: Brain: No acute intracranial hemorrhage, mass effect, or herniation. No extra-axial fluid collections. No evidence of acute territorial infarct. No hydrocephalus. Mild-to-moderate cortical volume loss. Patchy hypodensities throughout the periventricular and subcortical white matter, likely secondary to chronic microvascular ischemic changes. 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 Small old infarct in the right cerebellum. Vascular: Calcified plaques in the carotid siphons. Skull: Normal. Negative for fracture or focal lesion. Sinuses/Orbits: No acute finding. Other: None. IMPRESSION: No acute intracranial process identified. 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 Electronically Signed   By: Ofilia Neas M.D.   On: 05/01/2021 16:31   MR BRAIN WO CONTRAST  Result Date: 05/02/2021 CLINICAL DATA:  81 year old male with End stage renal disease. Delirium. EXAM: MRI HEAD WITHOUT CONTRAST TECHNIQUE: Multiplanar, multiecho pulse sequences of the brain and surrounding structures were obtained without intravenous contrast. COMPARISON:  Head CT yesterday.  Brain MRI 08/13/2016. FINDINGS: Brain: No restricted diffusion or evidence of acute infarction. Small chronic infarcts in the right cerebellum and bilateral thalami are new since 2018. Associated progressed, now Patchy and confluent bilateral cerebral white matter T2 and FLAIR hyperintensity. New T2 heterogeneity in the bilateral basal ganglia. Multiple chronic microhemorrhages in the brain, mostly in and around the thalami. There is also a small focus of chronic hemosiderin in the inferior right parietal lobe. No definite cortical encephalomalacia. No midline shift, mass effect, evidence of mass lesion, ventriculomegaly, extra-axial collection or acute intracranial hemorrhage. Cervicomedullary junction and pituitary are within normal limits. Vascular: Major intracranial vascular flow voids are stable since  2018, with suspected slow flow in the right sigmoid sinus and IJ bulb, and a degree of generalized intracranial artery tortuosity. Skull and upper cervical spine: Negative for age visible cervical spine. Normal bone marrow signal. Sinuses/Orbits: Postoperative changes to the left globe since 2018. Otherwise stable and negative. Other: Mastoids remain clear. Grossly normal visible internal auditory structures. Chronic midline suboccipital subcutaneous cyst or seroma appears slightly smaller since 2018. IMPRESSION: 1. No acute intracranial abnormality. 2. Advanced chronic small vessel disease, substantially progressed since a 2018 MRI. Electronically Signed   By: Genevie Ann M.D.   On: 05/02/2021 07:10   CT ABDOMEN PELVIS W CONTRAST  Result Date: 05/01/2021 CLINICAL DATA:  Abdominal pain, acute, nonlocalized EXAM: CT ABDOMEN AND PELVIS WITH CONTRAST TECHNIQUE: Multidetector CT imaging of the abdomen and pelvis  was performed using the standard protocol following bolus administration of intravenous contrast. RADIATION DOSE REDUCTION: This exam was performed according to the departmental dose-optimization program which includes automated exposure control, adjustment of the mA and/or kV according to patient size and/or use of iterative reconstruction technique. CONTRAST:  66mL OMNIPAQUE IOHEXOL 300 MG/ML  SOLN COMPARISON:  04/22/2021 FINDINGS: Lower chest: Moderate to large right pleural effusion, stable. Right lower lobe atelectasis also stable. Prior TAVR. Coronary artery and aortic calcifications. Hepatobiliary: Gallbladder is distended with layering stones noted. Cholecystostomy tube again noted within the gallbladder, unchanged. No biliary ductal dilatation. No focal hepatic abnormality. Pancreas: No focal abnormality or ductal dilatation. Spleen: No focal abnormality.  Normal size. Adrenals/Urinary Tract: Moderate left hydronephrosis and severe right hydronephrosis. No visible obstructing process. Findings unchanged  since prior study. Urinary bladder grossly unremarkable. Adrenal glands unremarkable. Stomach/Bowel: Previously seen rectal wall thickening not as apparent on today's study. Moderate stool throughout the colon. Nonobstructive bowel gas pattern. Vascular/Lymphatic: Aortoiliac atherosclerosis. No evidence of aneurysm or adenopathy. Reproductive: Radiation seeds in the prostate. Other: No free fluid or free air. Musculoskeletal: No acute bony abnormality. IMPRESSION: Cholelithiasis. Cholecystostomy tube remains in the gallbladder. Mild gallbladder distension. Moderate to large right pleural effusion with right lower lobe atelectasis, stable. Moderate to severe bilateral hydronephrosis, stable since prior study. No obstructing cause visualized. Aortoiliac atherosclerosis. Moderate stool burden throughout the colon. Electronically Signed   By: Rolm Baptise M.D.   On: 05/01/2021 23:07   DG Chest Portable 1 View  Result Date: 05/01/2021 CLINICAL DATA:  Altered mental status, difficulty breathing EXAM: PORTABLE CHEST 1 VIEW COMPARISON:  04/22/2021 FINDINGS: Transverse diameter of heart is increased. There is opacification of right lower lung fields suggesting right pleural effusion and possibly underlying infiltrate. This finding has not changed significantly. There are no signs of alveolar pulmonary edema or new focal infiltrates. There is no pneumothorax. IMPRESSION: No significant interval changes are noted in the right pleural effusion and infiltrate in the right lower lung fields. Electronically Signed   By: Elmer Picker M.D.   On: 05/01/2021 15:08    Scheduled Meds:  amoxicillin-clavulanate  1 tablet Oral Once   atorvastatin  10 mg Oral q AM   brimonidine  1 drop Right Eye TID   carbidopa-levodopa  1 tablet Oral TID   chlorhexidine  15 mL Mouth Rinse BID   clopidogrel  75 mg Oral Q breakfast   dorzolamide-timolol  1 drop Right Eye BID   ferric citrate  420 mg Oral TID WC   latanoprost  1 drop  Right Eye QHS   mouth rinse  15 mL Mouth Rinse q12n4p   pantoprazole  40 mg Oral BID   pilocarpine  1 drop Right Eye QID   QUEtiapine  25 mg Oral Q24H   senna-docusate  1 tablet Oral QHS   sodium chloride flush  3 mL Intravenous Q12H   Continuous Infusions:  sodium chloride     sodium chloride     sodium chloride       LOS: 2 days   Time spent: 27 minutes  Darliss Cheney, MD [facility]  05/03/2021, 10:31 AM  Please page via [facility] and do not message via secure chat for urgent patient care matters. Secure chat can be used for non urgent patient care matters.  How to contact the [facility] Attending or Consulting provider [place] or covering provider during after hours [place], for this patient?  Check the care team in [facility] and look  for a) attending/consulting [place] provider listed and b) the [facility] team listed. Page or secure chat [phone]. Log into [email or web address] and use [place]'s universal password to access. If you do not have the password, please contact the hospital operator. Locate the [facility] provider you are looking for under [facility] and page to a number that you can be directly reached. If you still have difficulty reaching the provider, please page the [facility] (Director on Call) for the Hospitalists listed on amion for assistance.

## 2021-05-04 DIAGNOSIS — R4182 Altered mental status, unspecified: Secondary | ICD-10-CM

## 2021-05-04 LAB — RENAL FUNCTION PANEL
Albumin: 2.5 g/dL — ABNORMAL LOW (ref 3.5–5.0)
Anion gap: 14 (ref 5–15)
BUN: 23 mg/dL (ref 8–23)
CO2: 28 mmol/L (ref 22–32)
Calcium: 9.1 mg/dL (ref 8.9–10.3)
Chloride: 100 mmol/L (ref 98–111)
Creatinine, Ser: 5.79 mg/dL — ABNORMAL HIGH (ref 0.61–1.24)
GFR, Estimated: 9 mL/min — ABNORMAL LOW (ref >60)
Glucose, Bld: 85 mg/dL (ref 70–99)
Phosphorus: 4 mg/dL (ref 2.5–4.6)
Potassium: 3.3 mmol/L — ABNORMAL LOW (ref 3.5–5.1)
Sodium: 142 mmol/L (ref 135–145)

## 2021-05-04 LAB — CBC
HCT: 32.8 % — ABNORMAL LOW (ref 39.0–52.0)
Hemoglobin: 10.6 g/dL — ABNORMAL LOW (ref 13.0–17.0)
MCH: 32.9 pg (ref 26.0–34.0)
MCHC: 32.3 g/dL (ref 30.0–36.0)
MCV: 101.9 fL — ABNORMAL HIGH (ref 80.0–100.0)
Platelets: 297 10*3/uL (ref 150–400)
RBC: 3.22 MIL/uL — ABNORMAL LOW (ref 4.22–5.81)
RDW: 14.5 % (ref 11.5–15.5)
WBC: 8 10*3/uL (ref 4.0–10.5)
nRBC: 0 % (ref 0.0–0.2)

## 2021-05-04 MED ORDER — ALBUMIN HUMAN 25 % IV SOLN: 25.0000 g | Freq: Once | INTRAVENOUS | Status: AC

## 2021-05-04 MED ORDER — HYDROXYZINE HCL 25 MG PO TABS
25.0000 mg | ORAL_TABLET | Freq: Four times a day (QID) | ORAL | Status: DC | PRN
  Administered 2021-05-04: 25 mg via ORAL

## 2021-05-04 MED ORDER — HYDROXYZINE HCL 25 MG PO TABS
ORAL_TABLET | ORAL | Status: AC
  Filled 2021-05-04: qty 1

## 2021-05-04 MED ORDER — ALBUMIN HUMAN 25 % IV SOLN
INTRAVENOUS | Status: AC
  Administered 2021-05-04: 25 g via INTRAVENOUS
  Filled 2021-05-04: qty 100

## 2021-05-04 MED ORDER — OXYCODONE HCL 5 MG PO TABS
5.0000 mg | ORAL_TABLET | Freq: Four times a day (QID) | ORAL | Status: DC | PRN
  Administered 2021-05-04 – 2021-05-05 (×3): 5 mg via ORAL
  Filled 2021-05-04 (×3): qty 1

## 2021-05-04 MED ORDER — COLLAGENASE 250 UNIT/GM EX OINT
TOPICAL_OINTMENT | Freq: Four times a day (QID) | CUTANEOUS | Status: DC
  Administered 2021-05-05: 1 via TOPICAL
  Filled 2021-05-04 (×2): qty 30

## 2021-05-04 MED ORDER — GERHARDT'S BUTT CREAM
TOPICAL_CREAM | Freq: Four times a day (QID) | CUTANEOUS | Status: DC
Start: 2021-05-05 — End: 2021-05-04

## 2021-05-04 MED ORDER — GERHARDT'S BUTT CREAM
TOPICAL_CREAM | Freq: Four times a day (QID) | CUTANEOUS | Status: DC
  Filled 2021-05-04 (×2): qty 1

## 2021-05-04 NOTE — Progress Notes (Signed)
PROGRESS NOTE    E[facility] White  SHF:[id number] DOB: 27-Oct-1940 DOA: 05/01/2021 PCP: Maryella Shivers, MD   Brief Narrative:  Elijah White is a 81 y.o. 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 One hour into his session he became agitated and was aggressive to staff. He was biting, scratching, trying to hit staff.  HD was stopped and he was sent to the emergency room.  He remained agitated in the emergency room and was given 2 doses of Geodon.  CT of the head was obtained which was negative for acute pathology.  He remained altered in the emergency room.  His ammonia level was normal.  Percutaneous drain for his chronic cholecystitis is still in place.  He is afebrile.  There is no report of seizure activity. Encephalopathy of an unclear etiology by the brain has been ordered to further evaluate and make sure no CVA has occurred causing the cephalopathy Hospitalist service been asked to admit for further management. Patient's had a complicated course with multiple admissions over the last few months.  Patient is unable to provide any history   Assessment & Plan:   Principal Problem:   Encephalopathy acute Active Problems:   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)   Essential hypertension   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HCC)   Anemia in chronic kidney disease   Bilateral hydroureteronephrosis   Pleural effusion, right   Prolonged QT interval  Encephalopathy acute/delirium- (present on admission): CT head followed by MRI brain both negative.  Likely delirium.  This is improving since we started him on Seroquel.  Continue that.  Continue  1. Avoid benzodiazepines, antihistamines, anticholinergics,  and minimize opiate use as these may worsen delirium. 2: Assess, prevent and manage pain as lack of treatment can result in delirium.  3: Provide appropriate lighting and clear signage; a clock and calendar should be easily visible to the patient. 4: Monitor environmental factors. Reduce light and noise at night (close shades, turn off lights, turn off TV, ect). Correct any alterations in sleep cycle. 5: Reorient the patient to person, place, time and situation on each encounter.  6: Correct sensory deficits if possible (replace eye glasses, hearing aids, ect). 7: Avoid restraints if able. Severely delirious patients benefit from constant observation by a sitter.  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 [facility]): Nephrology on board.  He received his dialysis today.  We will touch base with nephrology to see if he will need dialysis tomorrow to put back on Monday Wednesday Friday schedule before discharge because I hope that he will be discharged tomorrow.   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  No need of SSI.   Hypotension: I do not see any history of hypertension and is not on any hypertensives as well.  In fact his blood pressure is low and he is on midodrine which is resumed.   Anemia in chronic kidney disease- (present on admission): Stable   Pleural effusion, right: Very mild.  Does not need thoracentesis as he is not hypoxic and he is getting dialysis so volume should be handled by HD.  X-ray also indicates possible right lower lobe infiltrates but I  doubt pneumonia.  Reportedly he was diagnosed with aspiration pneumonia as outpatient and prescribed Augmentin on 04/25/2021 which we will continue and only 1 day more dose left.  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 Stable.   Prolonged QT interval Avoid medications which can further prolong QT interval  DVT  prophylaxis: SCDs Start: 05/02/21 0232   Code Status: DNR  Family Communication:  None present at bedside.  Discussed with daughter [name] over the phone.   Status is: Inpatient Remains inpatient appropriate because: Improving delirium.  Estimated body mass index is 23.33 kg/m as calculated from the following:   Height as of this encounter: 5\' 5"  (1.651 m).   Weight as of this encounter: 63.6 kg.  Pressure Injury 03/28/21 Coccyx Medial Stage 2 -  Partial thickness loss of dermis presenting as a shallow open injury with a red, pink wound bed without slough. wound bed is pink, no drainage noted (Active)  03/28/21 1028  Location: Coccyx  Location Orientation: Medial  Staging: Stage 2 -  Partial thickness loss of dermis presenting as a shallow open injury with a red, pink wound bed without slough.  Wound Description (Comments): wound bed is pink, no drainage noted  Present on Admission: Yes     Pressure Injury 04/23/21 Coccyx Medial Stage 3 -  Full thickness tissue loss. Subcutaneous fat may be visible but bone, tendon or muscle are NOT exposed. (Active)  04/23/21 0200  Location: Coccyx  Location Orientation: Medial  Staging: Stage 3 -  Full thickness tissue loss. Subcutaneous fat may be visible but bone, tendon or muscle are NOT exposed.  Wound Description (Comments):   Present on Admission: Yes   Nutritional Assessment: Body mass index is 23.33 kg/m.[name] Seen by dietician.  I agree with the assessment and plan as outlined below: Nutrition Status:        . Skin Assessment: I have examined the patient's skin and I agree with the wound assessment as performed by the wound care RN as outlined below: Pressure Injury 03/28/21 Coccyx Medial Stage 2 -  Partial thickness loss of dermis presenting as a shallow open injury with a red, pink wound bed without slough. wound bed is pink, no drainage noted (Active)  03/28/21 1028  Location: Coccyx  Location Orientation: Medial  Staging: Stage  2 -  Partial thickness loss of dermis presenting as a shallow open injury with a red, pink wound bed without slough.  Wound Description (Comments): wound bed is pink, no drainage noted  Present on Admission: Yes     Pressure Injury 04/23/21 Coccyx Medial Stage 3 -  Full thickness tissue loss. Subcutaneous fat may be visible but bone, tendon or muscle are NOT exposed. (Active)  04/23/21 0200  Location: Coccyx  Location Orientation: Medial  Staging: Stage 3 -  Full thickness tissue loss. Subcutaneous fat may be visible but bone, tendon or muscle are NOT exposed.  Wound Description (Comments):   Present on Admission: Yes    Consultants:  Nephrology  Procedures:  None  Antimicrobials:  Anti-infectives (From admission, onward)    Start     Dose/Rate Route Frequency Ordered Stop   05/03/21 1200  amoxicillin-clavulanate (AUGMENTIN) 500-125 MG per tablet 500 mg        1 tablet Oral  Once 05/02/21 0746 05/03/21 1155         Subjective: Seen and examined this morning in the dialysis unit.  Patient although kept his eyes closed during the whole encounter but he appeared to be alert and  answered all the questions appropriately regarding orientation and he had no complaints.  Reportedly he received Atarax about 90 minutes before I saw him.  Objective: Vitals:   05/04/21 1030 05/04/21 1100 05/04/21 1116 05/04/21 1143  BP: 128/66 120/68 126/63 133/73  Pulse: 78 71 66 90  Resp: 18 12 19 18   Temp:   97.7 F (36.5 C) 98.2 F (36.8 C)  TempSrc:    Oral  SpO2:    100%  Weight:   63.6 kg   Height:        Intake/Output Summary (Last 24 hours) at 05/04/2021 1327 Last data filed at 05/04/2021 1116 Gross per 24 hour  Intake 120 ml  Output 505 ml  Net -385 ml    Filed Weights   05/04/21 0715 05/04/21 1116  Weight: 62.4 kg 63.6 kg    Examination:  General exam: Appears calm and comfortable  Respiratory system: Clear to auscultation. Respiratory effort normal. Cardiovascular system:  S1 & S2 heard, RRR. No JVD, murmurs, rubs, gallops or clicks. No pedal edema. Gastrointestinal system: Abdomen is nondistended, soft and nontender. No organomegaly or masses felt. Normal bowel sounds heard. Central nervous system: Alert and oriented. No focal neurological deficits. Extremities: Symmetric 5 x 5 power. Skin: No rashes, lesions or ulcers.   Data Reviewed: I have personally reviewed following labs and imaging studies  CBC: Recent Labs  Lab 05/01/21 1450 05/02/21 0240 05/04/21 0641  WBC 7.7 7.0 8.0  NEUTROABS 5.6  --   --   HGB 9.0* 8.8* 10.6*  HCT 28.6* 27.3* 32.8*  MCV 105.5* 105.0* 101.9*  PLT 282 284 063    Basic Metabolic Panel: Recent Labs  Lab 05/01/21 1450 05/02/21 0240 05/04/21 0641  NA 144 145 142  K 3.5 3.5 3.3*  CL 100 102 100  CO2 28 30 28   GLUCOSE 127* 87 85  BUN 25* 27* 23  CREATININE 6.14* 6.56* 5.79*  CALCIUM 8.7* 8.7* 9.1  PHOS  --   --  4.0    GFR: Estimated Creatinine Clearance: 8.6 mL/min (A) (by C-G formula based on SCr of 5.79 mg/dL (H)). Liver Function Tests: Recent Labs  Lab 05/01/21 1450 05/04/21 0641  AST 21  --   ALT <5  --   ALKPHOS 125  --   BILITOT 0.7  --   PROT 6.3*  --   ALBUMIN 2.5* 2.5*    No results for input(s): LIPASE, AMYLASE in the last 168 hours. Recent Labs  Lab 05/01/21 1450  AMMONIA 20    Coagulation Profile: Recent Labs  Lab 05/01/21 1450  INR 1.0    Cardiac Enzymes: No results for input(s): CKTOTAL, CKMB, CKMBINDEX, TROPONINI in the last 168 hours. BNP (last 3 results) No results for input(s): PROBNP in the last 8760 hours. HbA1C: No results for input(s): HGBA1C in the last 72 hours. CBG: Recent Labs  Lab 05/01/21 1433  GLUCAP 117*    Lipid Profile: No results for input(s): CHOL, HDL, LDLCALC, TRIG, CHOLHDL, LDLDIRECT in the last 72 hours. Thyroid Function Tests: No results for input(s): TSH, T4TOTAL, FREET4, T3FREE, THYROIDAB in the last 72 hours. Anemia Panel: No results  for input(s): VITAMINB12, FOLATE, FERRITIN, TIBC, IRON, RETICCTPCT in the last 72 hours. Sepsis Labs: Recent Labs  Lab 05/01/21 1450  LATICACIDVEN 1.1     Recent Results (from the past 240 hour(s))  Resp Panel by RT-PCR (Flu A&B, Covid) Nasopharyngeal Swab     Status: None   Collection Time: 04/25/21  9:37 AM  Specimen: Nasopharyngeal Swab; Nasopharyngeal(NP) swabs in vial transport medium  Result Value Ref Range Status   SARS Coronavirus 2 by RT PCR NEGATIVE NEGATIVE Final    Comment: (NOTE) SARS-CoV-2 target nucleic acids are NOT DETECTED.  The SARS-CoV-2 RNA is generally detectable in upper respiratory specimens during the acute phase of infection. The lowest concentration of SARS-CoV-2 viral copies this assay can detect is 138 copies/mL. A negative result does not preclude SARS-Cov-2 infection and should not be used as the sole basis for treatment or other patient management decisions. A negative result may occur with  improper specimen collection/handling, submission of specimen other than nasopharyngeal swab, presence of viral mutation(s) within the areas targeted by this assay, and inadequate number of viral copies(<138 copies/mL). A negative result must be combined with clinical observations, patient history, and epidemiological information. The expected result is Negative.  Fact Sheet for Patients:  EntrepreneurPulse.com.au  Fact Sheet for Healthcare Providers:  IncredibleEmployment.be  This test is no t yet approved or cleared by the [place] FDA and  has been authorized for detection and/or diagnosis of SARS-CoV-2 by FDA under an Emergency Use Authorization (EUA). This EUA will remain  in effect (meaning this test can be used) for the duration of the COVID-19 declaration under Section 564(b)(1) of the Act, 21 U.S.C.section 360bbb-3(b)(1), unless the authorization is terminated  or revoked sooner.       Influenza A by  PCR NEGATIVE NEGATIVE Final   Influenza B by PCR NEGATIVE NEGATIVE Final    Comment: (NOTE) The Xpert Xpress SARS-CoV-2/FLU/RSV plus assay is intended as an aid in the diagnosis of influenza from Nasopharyngeal swab specimens and should not be used as a sole basis for treatment. Nasal washings and aspirates are unacceptable for Xpert Xpress SARS-CoV-2/FLU/RSV testing.  Fact Sheet for Patients: EntrepreneurPulse.com.au  Fact Sheet for Healthcare Providers: IncredibleEmployment.be  This test is not yet approved or cleared by the [place] FDA and has been authorized for detection and/or diagnosis of SARS-CoV-2 by FDA under an Emergency Use Authorization (EUA). This EUA will remain in effect (meaning this test can be used) for the duration of the COVID-19 declaration under Section 564(b)(1) of the Act, 21 U.S.C. section 360bbb-3(b)(1), unless the authorization is terminated or revoked.  Performed at [facility], [place] [address] [place],  [address]   Resp Panel by RT-PCR (Flu A&B, Covid) Nasopharyngeal Swab     Status: None   Collection Time: 05/01/21  2:35 PM   Specimen: Nasopharyngeal Swab; Nasopharyngeal(NP) swabs in vial transport medium  Result Value Ref Range Status   SARS Coronavirus 2 by RT PCR NEGATIVE NEGATIVE Final    Comment: (NOTE) SARS-CoV-2 target nucleic acids are NOT DETECTED.  The SARS-CoV-2 RNA is generally detectable in upper respiratory specimens during the acute phase of infection. The lowest concentration of SARS-CoV-2 viral copies this assay can detect is 138 copies/mL. A negative result does not preclude SARS-Cov-2 infection and should not be used as the sole basis for treatment or other patient management decisions. A negative result may occur with  improper specimen collection/handling, submission of specimen other than nasopharyngeal swab, presence of viral mutation(s) within the areas targeted by  this assay, and inadequate number of viral copies(<138 copies/mL). A negative result must be combined with clinical observations, patient history, and epidemiological information. The expected result is Negative.  Fact Sheet for Patients:  EntrepreneurPulse.com.au  Fact Sheet for Healthcare Providers:  IncredibleEmployment.be  This test is no t yet approved or cleared by  the [employer] and  has been authorized for detection and/or diagnosis of SARS-CoV-2 by FDA under an Emergency Use Authorization (EUA). This EUA will remain  in effect (meaning this test can be used) for the duration of the COVID-19 declaration under Section 564(b)(1) of the Act, 21 U.S.C.section 360bbb-3(b)(1), unless the authorization is terminated  or revoked sooner.       Influenza A by PCR NEGATIVE NEGATIVE Final   Influenza B by PCR NEGATIVE NEGATIVE Final    Comment: (NOTE) The Xpert Xpress SARS-CoV-2/FLU/RSV plus assay is intended as an aid in the diagnosis of influenza from Nasopharyngeal swab specimens and should not be used as a sole basis for treatment. Nasal washings and aspirates are unacceptable for Xpert Xpress SARS-CoV-2/FLU/RSV testing.  Fact Sheet for Patients: EntrepreneurPulse.com.au  Fact Sheet for Healthcare Providers: IncredibleEmployment.be  This test is not yet approved or cleared by the [place] FDA and has been authorized for detection and/or diagnosis of SARS-CoV-2 by FDA under an Emergency Use Authorization (EUA). This EUA will remain in effect (meaning this test can be used) for the duration of the COVID-19 declaration under Section 564(b)(1) of the Act, 21 U.S.C. section 360bbb-3(b)(1), unless the authorization is terminated or revoked.  Performed at [facility], [place] [address], [place], [place] [address]       Radiology Studies: No results found.  Scheduled Meds:   acetaminophen  1,000 mg Oral TID   atorvastatin  10 mg Oral q AM   brimonidine  1 drop Right Eye TID   carbidopa-levodopa  1 tablet Oral TID   chlorhexidine  15 mL Mouth Rinse BID   Chlorhexidine Gluconate Cloth  6 each Topical Q0600   clopidogrel  75 mg Oral Q breakfast   dorzolamide-timolol  1 drop Right Eye BID   ferric citrate  420 mg Oral TID WC   latanoprost  1 drop Right Eye QHS   mouth rinse  15 mL Mouth Rinse q12n4p   pantoprazole  40 mg Oral BID   pilocarpine  1 drop Right Eye QID   QUEtiapine  25 mg Oral Q24H   senna-docusate  1 tablet Oral QHS   sodium chloride flush  3 mL Intravenous Q12H   Continuous Infusions:  sodium chloride       LOS: 3 days   Time spent: 28 minutes  Darliss Cheney, MD [facility]  05/04/2021, 1:27 PM  Please page via [facility] and do not message via secure chat for urgent patient care matters. Secure chat can be used for non urgent patient care matters.  How to contact the [facility] Attending or Consulting provider [place] or covering provider during after hours [place], for this patient?  Check the care team in [facility] and look for a) attending/consulting [facility] provider listed and b) the [facility] team listed. Page or secure chat [phone]. Log into [email or web address] and use [place] universal password to access. If you do not have the password, please contact the hospital operator. Locate the [facility] provider you are looking for under [facility] and page to a number that you can be directly reached. If you still have difficulty reaching the provider, please page the [facility] (Director on Call) for the Hospitalists listed on amion for assistance.

## 2021-05-04 NOTE — Progress Notes (Signed)
?[facility] ?Progress Note  ? ?Subjective:    ?Seen and examined on dialysis.  BFR 400 mL/ min via AVF UF goal 1L net.  He is reporting abd pain.  He is calm without behavioral issues.   ? ?Objective ?Vitals:  ? 05/04/21 1030 05/04/21 1100 05/04/21 1116 05/04/21 1143  ?BP: 128/66 120/68 126/63 133/73  ?Pulse: 78 71 66 90  ?Resp: _0 ?Temp:   97.7 ?F (36.5 ?C) 98.2 ?F (36.8 ?C)  ?TempSrc:    Oral  ?SpO2:    100%  ?Weight:   63.6 kg   ?Height:      ? ?Physical Exam ?General: Ill-appearing; ?Heart: S1 and S2; No murmurs, gallops, or rubs ?Lungs: Clear anteriorly; No wheezing, rales, or rhonchi ?Abdomen: Flat and soft; noted perc chole tube in RUQ--> appears to be source of pain ?Extremities: No edema LLE ?Dialysis Access: L AVF (+) B/T ? ?Filed Weights  ? 05/04/21 0715 05/04/21 1116  ?Weight: 62.4 kg 63.6 kg  ? ? ?Intake/Output Summary (Last 24 hours) at 05/04/2021 1200 ?Last data filed at 05/04/2021 1116 ?Gross per 24 hour  ?Intake 120 ml  ?Output 505 ml  ?Net -385 ml  ? ? ?Additional Objective ?Labs: ?Basic Metabolic Panel: ?Recent Labs  ?Lab 05/01/21 ?1450 05/02/21 ?8295 05/04/21 ?6213  ?NA 144 145 142  ?K 3.5 3.5 3.3*  ?CL 100 102 100  ?CO2 _1 ?GLUCOSE 127* 87 85  ?BUN 25* 27* 23  ?CREATININE 6.14* 6.56* 5.79*  ?CALCIUM 8.7* 8.7* 9.1  ?PHOS  --   --  4.0  ? ?Liver Function Tests: ?Recent Labs  ?Lab 05/01/21 ?1450 05/04/21 ?0641  ?AST 21  --   ?ALT <5  --   ?ALKPHOS 125  --   ?BILITOT 0.7  --   ?PROT 6.3*  --   ?ALBUMIN 2.5* 2.5*  ? ?No results for input(s): LIPASE, AMYLASE in the last 168 hours. ?CBC: ?Recent Labs  ?Lab 05/01/21 ?1450 05/02/21 ?0865 05/04/21 ?7846  ?WBC 7.7 7.0 8.0  ?NEUTROABS 5.6  --   --   ?HGB 9.0* 8.8* 10.6*  ?HCT 28.6* 27.3* 32.8*  ?MCV 105.5* 105.0* 101.9*  ?PLT 282 284 297  ? ?Blood Culture ?   ?Component Value Date/Time  ? SDES BLOOD RIGHT ANTECUBITAL 04/08/2021 0133  ? SDES BLOOD RIGHT HAND 04/08/2021 0133  ? SPECREQUEST AEROBIC BOTTLE ONLY Blood Culture  adequate volume 04/08/2021 0133  ? SPECREQUEST AEROBIC BOTTLE ONLY Blood Culture adequate volume 04/08/2021 0133  ? CULT  04/08/2021 0133  ?  NO GROWTH 5 DAYS ?Performed at [facility], [place] [address], [place], [place] [address] ?  ? CULT  04/08/2021 0133  ?  NO GROWTH 5 DAYS ?Performed at [facility], [place] [address] [place], [place] [address] ?  ? REPTSTATUS 04/13/2021 FINAL 04/08/2021 0133  ? REPTSTATUS 04/13/2021 FINAL 04/08/2021 0133  ? ? ?Cardiac Enzymes: ?No results for input(s): CKTOTAL, CKMB, CKMBINDEX, TROPONINI in the last 168 hours. ?CBG: ?Recent Labs  ?Lab 05/01/21 ?1433  ?GLUCAP 117*  ? ?Iron Studies: No results for input(s): IRON, TIBC, TRANSFERRIN, FERRITIN in the last 72 hours. ?Lab Results  ?Component Value Date  ? INR 1.0 05/01/2021  ? INR 0.9 04/07/2021  ? INR 1.2 03/28/2021  ? ?Studies/Results: ?No results found. ? ?Medications: ? sodium chloride    ? ? acetaminophen  1,000 mg Oral TID  ? atorvastatin  10 mg Oral q AM  ? brimonidine  1 drop Right Eye TID  ? carbidopa-levodopa  1 tablet Oral TID  ? chlorhexidine  15 mL Mouth Rinse BID  ? Chlorhexidine Gluconate Cloth  6 each Topical Q0600  ? clopidogrel  75 mg Oral Q breakfast  ? dorzolamide-timolol  1 drop Right Eye BID  ? ferric citrate  420 mg Oral TID WC  ? latanoprost  1 drop Right Eye QHS  ? mouth rinse  15 mL Mouth Rinse q12n4p  ? pantoprazole  40 mg Oral BID  ? pilocarpine  1 drop Right Eye QID  ? QUEtiapine  25 mg Oral Q24H  ? senna-docusate  1 tablet Oral QHS  ? sodium chloride flush  3 mL Intravenous Q12H  ? ? ?Dialysis Orders: ?MWF at [facility]. Lives at [facility]. ?4hr, 400/500, EDW 65kg, 2K/2Ca, #2, LUE AVF, no heparin ?- Hectoral 3mcg IV q HD ?- Sensipar 60mg PO q HD ?- Mircera 100mcg IV q 2 weeks (last 2/15) ? ?Assessment/Plan: ?1. Acute Encephalopathy: Currently OFF restraints; responds to simple questions; CT head (-) acute changes but old infarcts. MRI (-) acute process and small vessel disease. U/A ordered; w/u  for delirium-managed by primary ?2. Pleural Effusion: Mild; no evidence of new infiltrates; previously treated with Augmentin for aspiration PNA. He is completing course here. ?3. ESRD - on HD MWF; Tolerated yesterday's HD with minimal UF. Continue Midodrine. HD today seems to be going well so far. . ?4. Anemia of CKD- Hgb 8.8; ESA due 05/03/21 ?5. Secondary hyperparathyroidism - Ca okay, will check PO4 in AM. ?6. HTN/volume - Appears euvolemic on exam. Noted Bps variable-continue Midodrine for now but will need to adjust if hypertension persist ?7. Nutrition - NPO for now; can advance to renal diet with fluid restriction once clinically stable. ?8.  Cholecystitis: s/p PTC prior ?9. Dispo: Inpatient.Palliative care consulted, appreciate assistance ? ? ?  MD ?[facility] ?05/04/2021,12:00 PM ? LOS: 3 days  ?  ?

## 2021-05-04 NOTE — Progress Notes (Signed)
?                                                   ?Daily Progress Note  ? ?Patient Name: Elijah White       Date: 05/04/2021 ?DOB: Jul 24, 1940  Age: 81 y.o. MRN#: [id number] ?Attending Physician: Darliss Cheney, MD ?Primary Care Physician: Maryella Shivers, MD ?Admit Date: 05/01/2021 ? ?Reason for Consultation/Follow-up: Establishing goals of care ? ?Subjective: ?Patient more responsive today, opening eyes.  Patient reports pain in sacral area.  Pain relieved by turning patient onto side.  Patient denies any other complaints. ? ?Length of Stay: 3 ? ?Current Medications: ?Scheduled Meds:  ?? acetaminophen  1,000 mg Oral TID  ?? atorvastatin  10 mg Oral q AM  ?? brimonidine  1 drop Right Eye TID  ?? carbidopa-levodopa  1 tablet Oral TID  ?? chlorhexidine  15 mL Mouth Rinse BID  ?? Chlorhexidine Gluconate Cloth  6 each Topical Q0600  ?? clopidogrel  75 mg Oral Q breakfast  ?? dorzolamide-timolol  1 drop Right Eye BID  ?? ferric citrate  420 mg Oral TID WC  ?? latanoprost  1 drop Right Eye QHS  ?? mouth rinse  15 mL Mouth Rinse q12n4p  ?? pantoprazole  40 mg Oral BID  ?? pilocarpine  1 drop Right Eye QID  ?? QUEtiapine  25 mg Oral Q24H  ?? senna-docusate  1 tablet Oral QHS  ?? sodium chloride flush  3 mL Intravenous Q12H  ? ? ?Continuous Infusions: ?? sodium chloride    ? ? ?PRN Meds: ?sodium chloride, eye wash, hydrOXYzine, LORazepam, metoCLOPramide, midodrine, oxyCODONE, sodium chloride flush ? ?Physical Exam ?Constitutional:   ?   General: He is not in acute distress. ?   Appearance: He is ill-appearing.  ?   Comments: Opens eyes to voice, responds to questions  ?Pulmonary:  ?   Effort: Pulmonary effort is normal.  ?Skin: ?   General: Skin is warm and dry.  ?         ? ?Vital Signs: BP 133/73 (BP Location: Right Arm)   Pulse 90   Temp 98.2 ?F (36.8 ?C) (Oral)   Resp 18   Ht 5'  5" (1.651 m)   Wt 63.6 kg   SpO2 100%   BMI 23.33 kg/m?  ?SpO2: SpO2: 100 % ?O2 Device: O2 Device: Room Air ?O2 Flow Rate:   ? ?Intake/output summary:  ?Intake/Output Summary (Last 24 hours) at 05/04/2021 1416 ?Last data filed at 05/04/2021 1116 ?Gross per 24 hour  ?Intake 120 ml  ?Output 505 ml  ?Net -385 ml  ? ?LBM: Last BM Date : 05/03/21 ?Baseline Weight: Weight:  (strtetcher n/a) ?Most recent weight: Weight: 63.6 kg ? ?     ?Palliative Assessment/Data: PPS 40% ? ? ? ?Flowsheet Rows   ? ?Flowsheet Row Most Recent Value  ?Intake Tab   ?Referral Department Hospitalist  ?Unit at Time of Referral Intermediate Care Unit  ?Palliative Care Primary Diagnosis Nephrology  ?Date Notified 05/02/21  ?Palliative Care Type New Palliative care  ?Reason for referral Clarify Goals of Care  ?Date of Admission 05/01/21  ?Date first seen by Palliative Care 05/03/21  ?# of days Palliative referral response time 1 Day(s)  ?# of days IP prior to Palliative referral 1  ?Clinical Assessment   ?Psychosocial & Spiritual Assessment   ?  Palliative Care Outcomes   ? ?  ? ? ?Patient Active Problem List  ? Diagnosis Date Noted  ?? Pleural effusion, right 05/02/2021  ?? Prolonged QT interval 05/02/2021  ?? Encephalopathy acute 05/01/2021  ?? Pressure ulcer of sacral region, stage 3 ([place]) 04/23/2021  ?? Intractable nausea and vomiting 04/22/2021  ?? 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 Bilateral hydroureteronephrosis 04/22/2021  ?? At risk for delirium 04/22/2021  ?? Delirium 04/12/2021  ?? Chronic hypotension 04/07/2021  ?? Parkinson's disease ([place]) 04/07/2021  ?? Acute urinary retention with foley  04/07/2021  ?? Aspiration pneumonia ([place]) 04/07/2021  ?? 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 Paroxysmal atrial fibrillation ([place]) 04/07/2021  ?? Pressure injury of skin 03/28/2021  ?? PAD (peripheral artery disease) ([place])   ?? Complicated UTI (urinary tract infection)    ?? Acute cholecystitis 03/15/2021  ?? Hypokalemia 09/28/2020  ?? 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 Blindness/glaucoma  01/19/2020  ?? Acute on chronic diastolic heart failure ([place]) 01/19/2020  ?? RBBB   ?? Hypercalcemia 12/16/2019  ?? Near syncope 12/13/2019  ?? Moderate protein-calorie malnutrition ([place]) 12/04/2019  ?? Severe aortic stenosis s/p TAVR    ?? 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 Macrocytic anemia 11/26/2019  ?? 2.5 cm left adrenal nodule 11/26/2019  ?? Colonic mass 11/26/2019  ?? Allergy, unspecified, initial encounter 11/16/2019  ?? Anaphylactic shock, unspecified, initial encounter 11/16/2019  ?? Other long term (current) drug therapy 09/05/2019  ?? Iron deficiency anemia, unspecified 07/13/2019  ?? Palliative care encounter 04/17/2019  ?? 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  ?? Dependence on renal dialysis ([place]) 04/16/2019  ?? 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 kidney disease, or unspecified chronic kidney disease 04/16/2019  ?? Localized edema 04/16/2019  ?? Nicotine dependence, unspecified, uncomplicated 41/66/0630  ?? Other disorders of phosphorus metabolism 04/16/2019  ?? Pain, unspecified 04/16/2019  ?? Pruritus, unspecified 04/16/2019  ?? Secondary hyperparathyroidism of renal origin ([place]) 04/16/2019  ?? Shortness of breath 04/16/2019  ?? Unspecified glaucoma 04/16/2019  ?? Unspecified urinary incontinence 04/16/2019  ?? ARF (acute renal failure) ([place]) 03/25/2018  ?? Vision loss of right eye 11/11/2017  ?? Bilateral pseudophakia 07/11/2017  ?? GERD (gastroesophageal reflux disease)/esophageal candidiasis  09/10/2016  ?? HLD (hyperlipidemia) 09/10/2016  ?? Essential hypertension 08/13/2016  ?? 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  ?? 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 COLONIC POLYPS, HX OF 10/22/2006  ? ? ?Palliative Care Assessment & Plan  ? ?HPI: ?81 y.o.  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 Was given 2 doses of geodon in ED d/t agitation. CT and MRI negative. Started on seroquel and has been more calm. PMT consulted to discuss [place].  ? ?Assessment: ?Reviewed chart, discussed with nurse, and assessed patient. ?From report and chart review it seems that patient tolerated dialysis well and patient has been calm and cooperative with care.  Patient continues to complain of discomfort but it seems that it is relieved by repositioning. ?Called and spoke with patient's daughter Elijah White.  We reviewed clinical condition; Elijah White tells me she has received report from the nurse and the doctor today.  Elijah White understands the patient  tolerated dialysis well today; we discussed the plan to discharge patient back to [facility] soon and continue dialysis.  We discussed plan to follow-up with palliative care outpatient-I shared with her that I spoke with the palliative care liaison yesterday. ? ?Recommendations/Plan: ?- MOST as above - patient established DNR, okay with limited medical interventions, no tube feeding ?-Outpatient palliative to follow-up [place] [facility] ?-Continue scheduled acetaminophen 3 times daily ?-Seems that behaviors are better controlled and patient now tolerating dialysis well, continue current plan of care ? ?Code Status: ?DNR ? ?Discharge Planning: ?[place] for rehab with Palliative care service follow-up ? ?Care plan was discussed with RN and patient's daughter ? ?Thank you for allowing the Palliative Medicine Team to assist in the care of this patient. ? ?Juel Burrow, DNP, AGNP-C ?Palliative Medicine Team ?Team Phone # [phone]  ?Pager [phone] ? ?

## 2021-05-05 MED ORDER — DARBEPOETIN ALFA 100 MCG/0.5ML IJ SOSY
100.0000 ug | PREFILLED_SYRINGE | INTRAMUSCULAR | Status: DC
  Administered 2021-05-05: 100 ug via INTRAVENOUS
  Filled 2021-05-05 (×2): qty 0.5

## 2021-05-05 MED ORDER — GABAPENTIN 100 MG PO CAPS: 100.0000 mg | ORAL_CAPSULE | Freq: Every day | ORAL | 0 refills | Status: AC

## 2021-05-05 NOTE — TOC Transition Note (Signed)
Transition of Care (TOC) - CM/SW Discharge Note ? ? ?Patient Details  ?Name: Elijah White ?MRN: [id number] ?Date of Birth: 06-20-1940 ? ?Transition of Care (TOC) CM/SW Contact:  ?Benard Halsted, LCSW ?Phone Number: ?05/05/2021, 4:43 PM ? ? ?Clinical Narrative:    ?Patient will DC to: [facility] ?Anticipated DC date: 05/05/21 ?Family notified: Left vm for daughter, [name] ?Transport by: Corey Harold (alerted them not to transport past 10pm) ? ? ?Per MD patient ready for DC to [facility]. RN to call report prior to discharge [phone]). RN, patient, patient's family, and facility notified of DC. Discharge Summary and FL2 sent to facility. DC packet on chart. Ambulance transport requested for patient.  ? ?CSW will sign off for now as social work intervention is no longer needed. Please consult [place] again if new needs arise. ? ? ? ? ?  ?Barriers to Discharge: No Barriers Identified ? ? ?Patient Goals and CMS Choice ?Patient states their goals for this hospitalization and ongoing recovery are:: SNF ?CMS Medicare.gov Compare Post Acute Care list provided to:: Patient Represenative (must comment) ?Choice offered to / list presented to : Adult Children ? ?Discharge Placement ?  ?Existing PASRR number confirmed : 05/05/21          ?Patient [facility] bed at: [facility] ?Patient to be transferred to facility by: [facility] ?Name of family member notified: Daughter ?Patient and family notified of of transfer: 05/05/21 ? ?Discharge Plan and Services ?In-house Referral: Clinical Social Work ?  ?Post Acute Care Choice: [place], Dialysis          ?  ?  ?  ?  ?  ?  ?  ?  ?  ?  ? ?Social Determinants of Health (SDOH) Interventions ?  ? ? ?Readmission Risk Interventions ?Readmission Risk Prevention Plan 05/05/2021 04/10/2021 03/17/2021  ?Transportation Screening Complete Complete Complete  ?Medication Review Press photographer) Complete Complete -  ?PCP or Specialist appointment within 3-5 days of discharge Complete Complete  -  ?[place] or Home Care Consult Complete Complete Complete  ?SW Recovery Care/Counseling Consult Complete Complete Complete  ?Palliative Care Screening Complete Not Applicable -  ?Skilled Nursing Facility Complete Complete Complete  ?Some recent data might be hidden  ? ? ? ? ? ?

## 2021-05-05 NOTE — Progress Notes (Signed)
Contacted [facility] and spoke to [place]. Clinic advised that pt will d/c today and will resume care on Monday.  ? ?Melven Sartorius ?Renal Navigator ?[phone] ?

## 2021-05-05 NOTE — TOC Initial Note (Addendum)
Transition of Care [facility]) - Initial/Assessment Note    Patient Details  Name: Elijah White MRN: [id number] Date of Birth: March 11, 1940  Transition of Care [facility]) CM/SW Contact:    Benard Halsted, [place] Phone Number: 05/05/2021, 11:30 AM  Clinical Narrative:                 CSW made [place] aware that patient needs to go to outpatient dialysis and asked if CSW sent patient to [facility] could SNF pick him up from there. They reported that patient would have to come to SNF first and that patient's normal HD time is 10:30am. CSW explained that it had been arranged for patient to go to OP HD this afternoon instead. SNF stated they would call [facility] to verify and let CSW know if they could take patient and pick him up again.   CSW received return call from SNF stating they spoke with [facility] and [facility] stated they could not take patient after 10:30am therefore patient would need to get HD at the hospital prior to discharge.   CSW contacted [facility] to verify information. Per Lelon Frohlich, she had received orders to do dialysis this afternoon for patient.  CSW contacted [facility] back to make them aware. [facility] stated that was not what they had been told and that they would not be able to take patient and pick patient up from dialysis. CSW suggested [facility] be used for transport but they stated patient would have to have dialysis at the hospital instead. CSW informed RN, MD and Renal Navigator. No COVID test needed.   Expected Discharge Plan: Skilled Nursing Facility Barriers to Discharge: No Barriers Identified   Patient Goals and CMS Choice Patient states their goals for this hospitalization and ongoing recovery are:: SNF CMS Medicare.gov Compare Post Acute Care list provided to:: Patient Represenative (must comment) Choice offered to / list presented to : Adult Children  Expected Discharge Plan and Services Expected Discharge Plan: [place] In-house Referral: Clinical Social Work   Post  Acute Care Choice: [place], Dialysis Living arrangements for the past 2 months: [place] Expected Discharge Date: 05/05/21                                    Prior Living Arrangements/Services Living arrangements for the past 2 months: [place] Lives with:: Facility Resident Patient language and need for interpreter reviewed:: Yes Do you feel safe going back to the place where you live?: Yes      Need for Family Participation in Patient Care: Yes (Comment) Care giver support system in place?: Yes (comment) Current home services:  [facility] Palliative) Criminal Activity/Legal Involvement Pertinent to Current Situation/Hospitalization: No - Comment as needed  Activities of Daily Living Home Assistive Devices/Equipment: Trapeze ADL Screening (condition at time of admission) Patient's cognitive ability adequate to safely complete daily activities?: No Is the patient deaf or have difficulty hearing?: No Does the patient have difficulty seeing, even when wearing glasses/contacts?: Yes Does the patient have difficulty concentrating, remembering, or making decisions?: Yes Patient able to express need for assistance with ADLs?: No Does the patient have difficulty dressing or bathing?: Yes Independently performs ADLs?: No Communication: Dependent Is this a change from baseline?: Pre-admission baseline Dressing (OT): Dependent Is this a change from baseline?: Pre-admission baseline Grooming: Dependent Is this a change from baseline?: Pre-admission baseline Feeding: Dependent Is this a change  from baseline?: Pre-admission baseline Bathing: Dependent Is this a change from baseline?: Pre-admission baseline Toileting: Dependent Is this a change from baseline?: Pre-admission baseline In/Out Bed: Dependent Is this a change from baseline?: Pre-admission baseline Walks in Home: Dependent Is this a change from baseline?: Pre-admission  baseline Does the patient have difficulty walking or climbing stairs?: Yes Weakness of Legs: Both Weakness of Arms/Hands: Both  Permission Sought/Granted Permission sought to share information with : Facility Sport and exercise psychologist, Family Supports Permission granted to share information with : Yes, Verbal Permission Granted  Share Information with NAME: [name]  Permission granted to share info w AGENCY: [place] granted to share info w Relationship: Daughter  Permission granted to share info w Contact Information: [phone]  Emotional Assessment Appearance:: Appears stated age Attitude/Demeanor/Rapport: Unable to Assess Affect (typically observed): Unable to Assess Orientation: : Oriented to Self, Oriented to Place, Oriented to Situation Alcohol / Substance Use: Not Applicable Psych Involvement: No (comment)  Admission diagnosis:  Encephalopathy acute [G93.40] Encephalopathy [G93.40] ESRD on hemodialysis ([place]) [N18.6, Z99.2] Altered mental status, unspecified altered mental status type [R41.82] Patient Active Problem List   Diagnosis Date Noted   Pleural effusion, right 05/02/2021   Prolonged QT interval 05/02/2021   Encephalopathy acute 05/01/2021   Pressure ulcer of sacral region, stage 3 ([place]) 04/23/2021   Intractable nausea and vomiting 04/22/2021   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 03/28/2021 04/22/2021   Bilateral hydroureteronephrosis 04/22/2021   At risk for delirium 04/22/2021   Delirium 04/12/2021   Chronic hypotension 04/07/2021   Parkinson's disease ([place]) 04/07/2021   Acute urinary retention with foley  04/07/2021   Aspiration pneumonia ([place]) 04/07/2021   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 04/07/2021   Paroxysmal atrial fibrillation ([place]) 04/07/2021   Pressure injury of skin 03/28/2021   PAD (peripheral artery disease) ([place])    Complicated UTI (urinary tract infection)    Acute cholecystitis  03/15/2021   Hypokalemia 09/28/2020   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, 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[place]) 08/12/2020   Blindness/glaucoma  01/19/2020   Acute on chronic diastolic heart failure ([place]) 01/19/2020   RBBB    Hypercalcemia 12/16/2019   Near syncope 12/13/2019   Moderate protein-calorie malnutrition ([place]) 12/04/2019   Severe aortic stenosis s/p TAVR     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[place]) 11/26/2019   Macrocytic anemia 11/26/2019   2.5 cm left adrenal nodule 11/26/2019   Colonic mass 11/26/2019   Allergy, unspecified, initial encounter 11/16/2019   Anaphylactic shock, unspecified, initial encounter 11/16/2019   Other long term (current) drug therapy 09/05/2019   Iron deficiency anemia, unspecified 07/13/2019   Palliative care encounter 04/17/2019   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   Dependence on renal dialysis ([place]) 04/16/2019   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 kidney disease, or unspecified chronic kidney disease 04/16/2019   Localized edema 04/16/2019   Nicotine dependence, unspecified, uncomplicated 26/20/3559   Other disorders of phosphorus metabolism 04/16/2019   Pain, unspecified 04/16/2019   Pruritus, unspecified 04/16/2019   Secondary hyperparathyroidism of renal origin ([place]) 04/16/2019   Shortness of breath 04/16/2019   Unspecified glaucoma 04/16/2019   Unspecified urinary incontinence 04/16/2019   ARF (acute renal failure) ([place]) 03/25/2018   Vision loss of right eye 11/11/2017   Bilateral pseudophakia 07/11/2017   GERD (gastroesophageal reflux disease)/esophageal candidiasis  09/10/2016   HLD (hyperlipidemia) 09/10/2016   Essential hypertension 08/13/2016   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   Type 2 diabetes mellitus with renal manifestations (A1c  5.3 on 2/3) 10/22/2006   COLONIC POLYPS, HX OF 10/22/2006   PCP:  Maryella Shivers, MD Pharmacy:  No Pharmacies Listed    Social Determinants of Health (SDOH)  Interventions    Readmission Risk Interventions Readmission Risk Prevention Plan 05/05/2021 04/10/2021 03/17/2021  Transportation Screening Complete Complete Complete  Medication Review Press photographer) Complete Complete -  PCP or Specialist appointment within 3-5 days of discharge Complete Complete -  [place] or Home Care Consult Complete Complete Complete  SW Recovery Care/Counseling Consult Complete Complete Complete  Palliative Care Screening Complete Not Applicable -  [place] Complete Complete Complete  Some recent data might be hidden

## 2021-05-05 NOTE — Progress Notes (Signed)
[facility] not able to pick up patient tonight prior to 10pm.  Facility, MD, and daughter aware.   ?

## 2021-05-05 NOTE — Progress Notes (Addendum)
Subjective: Seen in room awoken from sleep no complaints, thinks today is Sunday aware on hospital and dialysis schedule MWF, HD yesterday off schedule said tolerated ? ?Objective ?Vital signs in last 24 hours: ?Vitals:  ? 05/04/21 2000 05/04/21 2340 05/05/21 0302 05/05/21 0752  ?BP: 124/76 116/62 122/66 (!) 142/99  ?Pulse: 89 95 82 90  ?Resp: (!) _0 ?Temp:  98 ?F (36.7 ?C) 98.3 ?F (36.8 ?C) 97.8 ?F (36.6 ?C)  ?TempSrc:  Oral Oral Oral  ?SpO2: 100% 100% 100% 100%  ?Weight:      ?Height:      ? ?Weight change:  ? ?Physical Exam: ?General: Alert thin chronically ill elderly male NAD, pleasantly confused ?Heart: RRR no MRG appreciated ?Lungs: CTA anteriorly nonlabored breathing ?Abdomen: NABS, S, ND NT ?Extremities: No pedal edema, left BKA stump benign d ?Dialysis Access: Left arm AVF +bruit ? ?Dialysis Orders: ?MWF at [facility]. [facility]. ?4hr, 400/500, EDW 65kg, 2K/2Ca, #2, LUE AVF, no heparin ?- Hectoral 61mg IV q HD ?- Sensipar 666mPO q HD ?- Mircera 10013mIV q 2 weeks (last 2/15) ?  ?Problem/Plan: ?1. Acute Encephalopathy: Currently stable and OFF restraints; responds to simple questions; CT head (-) acute changes but old infarcts. MRI (-) acute process and small vessel disease. U/A ordered; pending results, w/u for delirium-managed by primary, per admit MS improved with Seroquel/noted on admission had Baclofen listed as outpatient meds which needs to be voided in ESRD patient secondary to confusion ?2. Pleural Effusion: Mild; no evidence of new infiltrates; previously treated with Augmentin for aspiration PNA. He is completing course here. ?3. ESRD - on HD MWF; Tolerated Thursday yesterday's HD with minimal UF 1.0 L. Continue Midodrine.  Plans for dialysis today to keep on schedule as noted ordered  ?4. HTN/volume - Appears euvolemic on exam. Noted Bps variable-continue Midodrine for now but will need to adjust if hypertension persist ?5.Anemia of CKD- Hgb 10.6 on 3/02; ESA last given 2/15,  not given yet, give today ?6Secondaryhyperparathyroidism -corrected calcium high Ca on Sensipar, will hold Hectorol for now phosphorus 4.0 on Auryxia as binder ?7. Nutrition -diet yesterday advanced to renal diet with fluid restriction once clinically stable. ?8.  Cholecystitis: s/p PTC prior ?9. Dispo: Inpatient.Palliative care consulted, appreciate assistance noted was made DNR with daughter consulting/need to make sure  OP kidney center where of DNR @ DC ? ? ?Noted BACLOFEN ON DC FOR PRN HICCUPS ,  CONTRAINDICATED IN ESRD pt  2/2 AMS ETIOLOGY  ?, will have kid center notify NH  to dc  ?Elijah White ?[facility] ?Beeper [phone] 7782/05/2021,10:32 AM ? LOS: 4 days  ? ?Labs: ?Basic Metabolic Panel: ?Recent Labs  ?Lab 05/01/21 ?1450 05/02/21 ?0240973/02/23 ?[id number] 144 145 142  ?K 3.5 3.5 3.3*  ?CL 100 102 100  ?CO2 _1 ?GLUCOSE 127* 87 85  ?BUN 25* 27* 23  ?CREATININE 6.14* 6.56* 5.79*  ?CALCIUM 8.7* 8.7* 9.1  ?PHOS  --   --  4.0  ? ?Liver Function Tests: ?Recent Labs  ?Lab 05/01/21 ?1450 05/04/21 ?0641  ?AST 21  --   ?ALT <5  --   ?ALKPHOS 125  --   ?BILITOT 0.7  --   ?PROT 6.3*  --   ?ALBUMIN 2.5* 2.5*  ? ?No results for input(s): LIPASE, AMYLASE in the last 168 hours. ?Recent Labs  ?Lab 05/01/21 ?1450  ?AMMONIA 20  ? ?CBC: ?Recent Labs  ?Lab 05/01/21 ?1450  05/02/21 ?1829 05/04/21 ?9371  ?WBC 7.7 7.0 8.0  ?NEUTROABS 5.6  --   --   ?HGB 9.0* 8.8* 10.6*  ?HCT 28.6* 27.3* 32.8*  ?MCV 105.5* 105.0* 101.9*  ?PLT 282 284 297  ? ?Cardiac Enzymes: ?No results for input(s): CKTOTAL, CKMB, CKMBINDEX, TROPONINI in the last 168 hours. ?CBG: ?Recent Labs  ?Lab 05/01/21 ?1433  ?GLUCAP 117*  ? ? ?Studies/Results: ?No results found. ?Medications: ? sodium chloride    ? ? acetaminophen  1,000 mg Oral TID  ? atorvastatin  10 mg Oral q AM  ? brimonidine  1 drop Right Eye TID  ? carbidopa-levodopa  1 tablet Oral TID  ? chlorhexidine  15 mL Mouth Rinse BID  ? Chlorhexidine Gluconate Cloth  6 each Topical  Q0600  ? clopidogrel  75 mg Oral Q breakfast  ? collagenase   Topical QID  ? dorzolamide-timolol  1 drop Right Eye BID  ? ferric citrate  420 mg Oral TID WC  ? Gerhardt's butt cream   Topical QID  ? latanoprost  1 drop Right Eye QHS  ? mouth rinse  15 mL Mouth Rinse q12n4p  ? pantoprazole  40 mg Oral BID  ? pilocarpine  1 drop Right Eye QID  ? QUEtiapine  25 mg Oral Q24H  ? senna-docusate  1 tablet Oral QHS  ? sodium chloride flush  3 mL Intravenous Q12H  ? ? ? ? ?

## 2021-05-05 NOTE — Discharge Summary (Signed)
PatientPhysician Discharge Summary  EUEL White HEN:[id number] DOB: 1940/07/23 DOA: 05/01/2021  PCP: Maryella Shivers, MD  Admit date: 05/01/2021 Discharge date: 05/05/2021 30 Day Unplanned Readmission Risk Score    Flowsheet Row ED to Hosp-Admission (Current) from 05/01/2021 in [facility]  30 Day Unplanned Readmission Risk Score (%) 43.53 Filed at 05/05/2021 0801       This score is the patient's risk of an unplanned readmission within 30 days of being discharged (0 -100%). The score is based on dignosis, age, lab data, medications, orders, and past utilization.   Low:  0-14.9   Medium: 15-21.9   High: 22-29.9   Extreme: 30 and above          Admitted From: SNF Disposition: SNF  Recommendations for Outpatient Follow-up:  Follow up with PCP in 1-2 weeks Please obtain BMP/CBC in one week Please follow up with your PCP on the following pending results: Unresulted Labs (From admission, onward)     Start     Ordered   05/02/21 0745  Urine rapid drug screen (hosp performed)  ONCE - STAT,   STAT        05/02/21 0744   05/01/21 1347  Urinalysis, Routine w reflex microscopic  (ED ALOC)  Once,   STAT        05/01/21 [place]: None Equipment/Devices: None  Discharge Condition: Stable CODE STATUS: DNR Diet recommendation: Renal  Subjective: Seen and examined.  He is fully alert and oriented.  He has no complaints.  Brief/Interim Summary: Elijah White is a 81 y.o. 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 One hour into his session he became agitated and was aggressive to staff. He was biting, scratching, trying to hit staff.  HD was stopped and he  was sent to the emergency room.  He remained agitated in the emergency room and was given 2 doses of Geodon.  CT of the head was obtained which was negative for acute pathology.  He remained altered in the emergency room.  His ammonia level was normal.  Percutaneous drain for his chronic cholecystitis is still in place.  He is afebrile.  There is no report of seizure activity.  He was admitted under hospitalist service.   Encephalopathy acute/delirium- (present on admission): CT head followed by MRI brain both negative.  Likely delirium.  This was treated with nightly Seroquel and delirium precautions and he is back to his baseline, fully alert and oriented.  He is going to be discharged back to his facility today.   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 [facility]): He received his dialysis off schedule, yesterday and day before.  Daughter is concerned that he may not be able to do well if dialysis is waited until Monday.  Per her insistence, nephrology is going to do 2-hour dialysis today and then patient will be discharged to SNF today.   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   Hypotension: I do not see any history of hypertension and is not on any hypertensives as well.  In fact his blood pressure  is low and he is on midodrine which is resumed.   Anemia in chronic kidney disease- (present on admission): Stable   Pleural effusion, right: Very mild.  Does not need thoracentesis as he is not hypoxic and he is getting dialysis so volume should be handled by HD.  X-ray also indicates possible right lower lobe infiltrates but I doubt pneumonia.  Reportedly he was diagnosed with aspiration pneumonia as outpatient and prescribed Augmentin on 04/25/2021 which we will continue and only 1 day more dose left.   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 Stable.   Prolonged QT  interval Avoid medications which can further prolong QT interval  Discharge plan was discussed with the daughter and she verbalized understanding and agreed with it.  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)   Essential hypertension   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HCC)   Anemia in chronic kidney disease   Delirium   Bilateral hydroureteronephrosis   Pleural effusion, right   Prolonged QT interval    Discharge Instructions   Allergies as of 05/05/2021   No Known Allergies      Medication List     STOP taking these medications    amoxicillin-clavulanate 500-125 MG tablet Commonly known as: Augmentin   amoxicillin-clavulanate 875-125 MG tablet Commonly known as: AUGMENTIN   hydrOXYzine 25 MG tablet Commonly known as: ATARAX   nystatin 100000 UNIT/ML suspension Commonly known as: MYCOSTATIN       TAKE these medications    acetaminophen 500 MG tablet Commonly known as: TYLENOL Take 2 tablets (1,000 mg total) by mouth every 8 (eight) hours as needed for moderate pain or headache.   atorvastatin 10 MG tablet Commonly known as: LIPITOR Take 1 tablet (10 mg total) by mouth in the morning. What changed: when to take this   Baclofen 5 MG Tabs Take 5 mg by mouth 3 (three) times daily as needed (hiccups). What changed: Another medication with the same name was removed. Continue taking this medication, and follow the directions you see here.   blood glucose meter kit and supplies Kit Dispense based on patient and insurance preference. Use up to four times daily as directed. (FOR ICD-9 250.00, 250.01). For QAC - HS accuchecks.   brimonidine 0.15 % ophthalmic solution Commonly known as: ALPHAGAN Place 1 drop into the right eye 3 (three) times daily.   carbidopa-levodopa 25-100 MG tablet Commonly known as: Sinemet Take 1 tablet by mouth 3 (three) times  daily. What changed:  when to take this additional instructions   cinacalcet 30 MG tablet Commonly known as: SENSIPAR Take 2 tablets (60 mg total) by mouth every Monday, Wednesday, and Friday with hemodialysis.   clopidogrel 75 MG tablet Commonly known as: PLAVIX Take 1 tablet (75 mg total) by mouth daily with breakfast.   Dialyvite 800 0.8 MG Tabs Take 1 tablet by mouth daily.   dorzolamide-timolol 22.3-6.8 MG/ML ophthalmic solution Commonly known as: COSOPT Place 1 drop into the right eye 2 (two) times daily.   ferric citrate 1 GM 210 MG(Fe) tablet Commonly known as: AURYXIA Take 420 mg by mouth 3 (three) times daily with meals.   gabapentin 100 MG capsule Commonly known as: Neurontin Take 1 capsule (100 mg total) by mouth daily.   GenTeal Severe 0.3 % Gel ophthalmic ointment Generic drug: hypromellose Place 1 application into the left eye every 12 (twelve) hours as needed for dry  eyes.   melatonin 5 MG Tabs Take 5 mg by mouth at bedtime.   metoCLOPramide 5 MG tablet Commonly known as: REGLAN Take 5 mg by mouth 2 (two) times daily as needed for nausea or vomiting. What changed: Another medication with the same name was removed. Continue taking this medication, and follow the directions you see here.   midodrine 10 MG tablet Commonly known as: PROAMATINE Take 1 tablet (10 mg total) by mouth 3 (three) times daily with meals as needed (SBP <100).   ondansetron 4 MG tablet Commonly known as: ZOFRAN Take 4 mg by mouth every 8 (eight) hours as needed for nausea or vomiting.   OneTouch Verio test strip Generic drug: glucose blood USE AS DIRECTED TO TEST FOUR TIMES A DAY   OVER THE COUNTER MEDICATION Take 1 Package by mouth with breakfast, with lunch, and with evening meal. Magic cup or mighty shake   pantoprazole 40 MG tablet Commonly known as: PROTONIX Take 1 tablet (40 mg total) by mouth 2 (two) times daily.   pilocarpine 4 % ophthalmic solution Commonly known  as: PILOCAR Place 1 drop into the right eye 4 (four) times daily.   polyethylene glycol 17 g packet Commonly known as: MIRALAX / GLYCOLAX Take 17 g by mouth daily. What changed: additional instructions   PROSTAT PO Take 30 mLs by mouth in the morning and at bedtime.   Rocklatan 0.02-0.005 % Soln Generic drug: Netarsudil-Latanoprost Place 1 drop into the right eye at bedtime.   senna-docusate 8.6-50 MG tablet Commonly known as: Senokot-S Take 1 tablet by mouth at bedtime.   Vitamin D (Ergocalciferol) 1.25 MG (50000 UNIT) Caps capsule Commonly known as: DRISDOL Take 50,000 Units by mouth See admin instructions. Every Monday x 8 weeks         Follow-up Information     Maryella Shivers, MD Follow up in 1 week(s).   Specialty: Family Medicine Contact information: [place] [address] [phone]         Geralynn Rile, MD .   Specialties: Cardiology, Internal Medicine, Radiology Contact information: [address] [place] [address] [phone]                No Known Allergies  Consultations: Nephrology   Procedures/Studies: CT Head Wo Contrast  Result Date: 05/01/2021 CLINICAL DATA:  Altered mental status EXAM: CT HEAD WITHOUT CONTRAST TECHNIQUE: Contiguous axial images were obtained from the base of the skull through the vertex without intravenous contrast. RADIATION DOSE REDUCTION: This exam was performed according to the departmental dose-optimization program which includes automated exposure control, adjustment of the mA and/or kV according to patient size and/or use of iterative reconstruction technique. COMPARISON:  CT head 04/22/2021 FINDINGS: Brain: No acute intracranial hemorrhage, mass effect, or herniation. No extra-axial fluid collections. No evidence of acute territorial infarct. No hydrocephalus. Mild-to-moderate cortical volume loss. Patchy hypodensities throughout the periventricular and  subcortical white matter, likely secondary to chronic microvascular ischemic changes. 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 Small old infarct in the right cerebellum. Vascular: Calcified plaques in the carotid siphons. Skull: Normal. Negative for fracture or focal lesion. Sinuses/Orbits: No acute finding. Other: None. IMPRESSION: No acute intracranial process identified. 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 Electronically Signed   By: Ofilia Neas M.D.   On: 05/01/2021 16:31   CT Head Wo Contrast  Result Date: 04/22/2021 CLINICAL DATA:  81 year old male with head and neck injury following fall.  Vomiting. Currently on Plavix. EXAM: CT HEAD WITHOUT CONTRAST CT CERVICAL SPINE WITHOUT CONTRAST TECHNIQUE: Multidetector CT imaging of the head and cervical spine was performed following the standard protocol without intravenous contrast. Multiplanar CT image reconstructions of the cervical spine were also generated. RADIATION DOSE REDUCTION: This exam was performed according to the departmental dose-optimization program which includes automated exposure control, adjustment of the mA and/or kV according to patient size and/or use of iterative reconstruction technique. COMPARISON:  10/19/2020 CT and prior studies FINDINGS: CT HEAD FINDINGS Brain: No evidence of acute infarction, hemorrhage, hydrocephalus, extra-axial collection or mass lesion/mass effect. Atrophy, chronic small-vessel white matter ischemic changes, and remote bilateral basal ganglia, thalamic and RIGHT cerebellar infarcts noted. Vascular: Carotid and vertebral atherosclerotic calcifications are noted. Skull: Normal. Negative for fracture or focal lesion. Sinuses/Orbits: No acute abnormality. LEFT orbital surgical changes again noted. Other: RIGHT frontal scalp soft tissue swelling is identified. CT CERVICAL SPINE FINDINGS Alignment: Normal. Skull base and vertebrae: No acute fracture. No primary bone lesion  or focal pathologic process. Soft tissues and spinal canal: No prevertebral fluid or swelling. No visible canal hematoma. Disc levels: Multilevel degenerative disc disease/spondylosis and facet arthropathy are unchanged. Upper chest: A RIGHT pleural effusion is noted and will be discussed in the chest CT. Other: A sebaceous cyst in the subcutaneous tissues of the posterior neck is again noted. IMPRESSION: 1. No evidence of acute intracranial abnormality. Atrophy, chronic small-vessel white matter ischemic changes and remote infarcts. 2. RIGHT frontal scalp soft tissue swelling without fracture. 3. No static evidence of acute injury to the cervical spine. 4. RIGHT pleural effusion. Electronically Signed   By: Margarette Canada M.D.   On: 04/22/2021 20:09   CT Cervical Spine Wo Contrast  Result Date: 04/22/2021 CLINICAL DATA:  81 year old male with head and neck injury following fall. Vomiting. Currently on Plavix. EXAM: CT HEAD WITHOUT CONTRAST CT CERVICAL SPINE WITHOUT CONTRAST TECHNIQUE: Multidetector CT imaging of the head and cervical spine was performed following the standard protocol without intravenous contrast. Multiplanar CT image reconstructions of the cervical spine were also generated. RADIATION DOSE REDUCTION: This exam was performed according to the departmental dose-optimization program which includes automated exposure control, adjustment of the mA and/or kV according to patient size and/or use of iterative reconstruction technique. COMPARISON:  10/19/2020 CT and prior studies FINDINGS: CT HEAD FINDINGS Brain: No evidence of acute infarction, hemorrhage, hydrocephalus, extra-axial collection or mass lesion/mass effect. Atrophy, chronic small-vessel white matter ischemic changes, and remote bilateral basal ganglia, thalamic and RIGHT cerebellar infarcts noted. Vascular: Carotid and vertebral atherosclerotic calcifications are noted. Skull: Normal. Negative for fracture or focal lesion. Sinuses/Orbits: No  acute abnormality. LEFT orbital surgical changes again noted. Other: RIGHT frontal scalp soft tissue swelling is identified. CT CERVICAL SPINE FINDINGS Alignment: Normal. Skull base and vertebrae: No acute fracture. No primary bone lesion or focal pathologic process. Soft tissues and spinal canal: No prevertebral fluid or swelling. No visible canal hematoma. Disc levels: Multilevel degenerative disc disease/spondylosis and facet arthropathy are unchanged. Upper chest: A RIGHT pleural effusion is noted and will be discussed in the chest CT. Other: A sebaceous cyst in the subcutaneous tissues of the posterior neck is again noted. IMPRESSION: 1. No evidence of acute intracranial abnormality. Atrophy, chronic small-vessel white matter ischemic changes and remote infarcts. 2. RIGHT frontal scalp soft tissue swelling without fracture. 3. No static evidence of acute injury to the cervical spine. 4. RIGHT pleural effusion. Electronically Signed   By: Cleatis Polka.D.  On: 04/22/2021 20:09   MR BRAIN WO CONTRAST  Result Date: 05/02/2021 CLINICAL DATA:  81 year old male with End stage renal disease. Delirium. EXAM: MRI HEAD WITHOUT CONTRAST TECHNIQUE: Multiplanar, multiecho pulse sequences of the brain and surrounding structures were obtained without intravenous contrast. COMPARISON:  Head CT yesterday.  Brain MRI 08/13/2016. FINDINGS: Brain: No restricted diffusion or evidence of acute infarction. Small chronic infarcts in the right cerebellum and bilateral thalami are new since 2018. Associated progressed, now Patchy and confluent bilateral cerebral white matter T2 and FLAIR hyperintensity. New T2 heterogeneity in the bilateral basal ganglia. Multiple chronic microhemorrhages in the brain, mostly in and around the thalami. There is also a small focus of chronic hemosiderin in the inferior right parietal lobe. No definite cortical encephalomalacia. No midline shift, mass effect, evidence of mass lesion,  ventriculomegaly, extra-axial collection or acute intracranial hemorrhage. Cervicomedullary junction and pituitary are within normal limits. Vascular: Major intracranial vascular flow voids are stable since 2018, with suspected slow flow in the right sigmoid sinus and IJ bulb, and a degree of generalized intracranial artery tortuosity. Skull and upper cervical spine: Negative for age visible cervical spine. Normal bone marrow signal. Sinuses/Orbits: Postoperative changes to the left globe since 2018. Otherwise stable and negative. Other: Mastoids remain clear. Grossly normal visible internal auditory structures. Chronic midline suboccipital subcutaneous cyst or seroma appears slightly smaller since 2018. IMPRESSION: 1. No acute intracranial abnormality. 2. Advanced chronic small vessel disease, substantially progressed since a 2018 MRI. Electronically Signed   By: Genevie Ann M.D.   On: 05/02/2021 07:10   CT ABDOMEN PELVIS W CONTRAST  Result Date: 05/01/2021 CLINICAL DATA:  Abdominal pain, acute, nonlocalized EXAM: CT ABDOMEN AND PELVIS WITH CONTRAST TECHNIQUE: Multidetector CT imaging of the abdomen and pelvis was performed using the standard protocol following bolus administration of intravenous contrast. RADIATION DOSE REDUCTION: This exam was performed according to the departmental dose-optimization program which includes automated exposure control, adjustment of the mA and/or kV according to patient size and/or use of iterative reconstruction technique. CONTRAST:  47mL OMNIPAQUE IOHEXOL 300 MG/ML  SOLN COMPARISON:  04/22/2021 FINDINGS: Lower chest: Moderate to large right pleural effusion, stable. Right lower lobe atelectasis also stable. Prior TAVR. Coronary artery and aortic calcifications. Hepatobiliary: Gallbladder is distended with layering stones noted. Cholecystostomy tube again noted within the gallbladder, unchanged. No biliary ductal dilatation. No focal hepatic abnormality. Pancreas: No focal  abnormality or ductal dilatation. Spleen: No focal abnormality.  Normal size. Adrenals/Urinary Tract: Moderate left hydronephrosis and severe right hydronephrosis. No visible obstructing process. Findings unchanged since prior study. Urinary bladder grossly unremarkable. Adrenal glands unremarkable. Stomach/Bowel: Previously seen rectal wall thickening not as apparent on today's study. Moderate stool throughout the colon. Nonobstructive bowel gas pattern. Vascular/Lymphatic: Aortoiliac atherosclerosis. No evidence of aneurysm or adenopathy. Reproductive: Radiation seeds in the prostate. Other: No free fluid or free air. Musculoskeletal: No acute bony abnormality. IMPRESSION: Cholelithiasis. Cholecystostomy tube remains in the gallbladder. Mild gallbladder distension. Moderate to large right pleural effusion with right lower lobe atelectasis, stable. Moderate to severe bilateral hydronephrosis, stable since prior study. No obstructing cause visualized. Aortoiliac atherosclerosis. Moderate stool burden throughout the colon. Electronically Signed   By: Rolm Baptise M.D.   On: 05/01/2021 23:07   CT ABDOMEN PELVIS W CONTRAST  Result Date: 04/07/2021 CLINICAL DATA:  Abdominal pain, acute, nonlocalized. EXAM: CT ABDOMEN AND PELVIS WITH CONTRAST TECHNIQUE: Multidetector CT imaging of the abdomen and pelvis was performed using the standard protocol following bolus administration of intravenous contrast. RADIATION  DOSE REDUCTION: This exam was performed according to the departmental dose-optimization program which includes automated exposure control, adjustment of the mA and/or kV according to patient size and/or use of iterative reconstruction technique. CONTRAST:  11mL OMNIPAQUE IOHEXOL 350 MG/ML SOLN COMPARISON:  04/02/2021 FINDINGS: Lower chest: Moderate right pleural effusion has slightly increased in size with progressive, compressive atelectasis of the right lower lobe. Tiny left pleural effusion with minimal left  basilar compressive atelectasis. Extensive multi-vessel coronary artery calcification again noted. 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 Cardiac size within normal limits. Hepatobiliary: Percutaneous cholecystostomy is unchanged with its retaining loop seen within the gallbladder lumen. The gallbladder, however, remains distended suggesting inadequate drainage of gallbladder contents and/or occlusion of the catheter. Previously noted hyperdensity related to hemorrhagic intraluminal contents is not as well appreciated on this examination possibly related to partial lysis of a blood product. Layering gallstones are again identified within the gallbladder lumen. Liver otherwise unremarkable. No intra or extrahepatic biliary ductal dilation. Pancreas: Unremarkable Spleen: Unremarkable Adrenals/Urinary Tract: 2.1 x 2.5 cm heterogeneously enhancing left adrenal nodule is again identified and appears stable since remote prior examination of 11/26/2019, though is indeterminate. The right adrenal gland is unremarkable. Moderate bilateral renal cortical atrophy again noted. No enhancing intrarenal masses are identified. No hydronephrosis. No intrarenal or ureteral calculi. Vascular calcifications are noted within the renal hila bilaterally. The bladder is decompressed with a Foley catheter balloon seen within its lumen. Stomach/Bowel: Stomach is within normal limits. Appendix appears normal. No evidence of bowel wall thickening, distention, or inflammatory changes. Vascular/Lymphatic: Extensive aortoiliac atherosclerotic calcification with probable hemodynamically significant stenoses involving the lower extremity arterial inflow bilaterally. This is unchanged and not optimally assessed on this non arteriographic examination. No aortic aneurysm. No pathologic adenopathy within the abdomen and pelvis. Reproductive: Brachytherapy seeds are seen within the prostate gland Other: There is progressive moderate  diffuse subcutaneous body wall edema, particularly within the right flank which may relate to dependent positioning or superimposed local trauma or inflammation. Small fat containing left inguinal hernia. Rectum unremarkable. Musculoskeletal: No acute bone abnormality. No lytic or blastic bone lesion. IMPRESSION: Stable examination with percutaneous cholecystostomy in place but persistent distension of the gallbladder suggesting inadequate drainage of complex intraluminal contents and/or catheter occlusion. Evolution of intraluminal blood product within the gallbladder lumen noted. Layering gallstones again identified. Progressive anasarca with enlarging pleural effusions and progressive diffuse body wall subcutaneous edema. Asymmetric right flank subcutaneous body wall edema which may relate to dependent positioning or superimposed local trauma or inflammation. Correlation with clinical examination is recommended. 2.5 cm heterogeneously enhancing left adrenal nodule. While stable since remote prior examination of 11/26/2019, a malignant lesion such as a pheochromocytoma, is not excluded and dedicated adrenal mass protocol CT or MRI examination is recommended once the patient's acute issues have resolved. Peripheral vascular disease with suspected bilateral lower extremity arterial inflow stenoses. This would be better assessed with CT arteriography if there is a clinical history of a lower extremity claudication or arterial vascular insufficiency. Aortic Atherosclerosis (ICD10-I70.0). Electronically Signed   By: Fidela Salisbury M.D.   On: 04/07/2021 19:22   DG Chest Portable 1 View  Result Date: 05/01/2021 CLINICAL DATA:  Altered mental status, difficulty breathing EXAM: PORTABLE CHEST 1 VIEW COMPARISON:  04/22/2021 FINDINGS: Transverse diameter of heart is increased. There is opacification of right lower lung fields suggesting right pleural effusion and possibly underlying infiltrate. This finding has not  changed significantly. There are no signs of alveolar pulmonary edema or new focal  infiltrates. There is no pneumothorax. IMPRESSION: No significant interval changes are noted in the right pleural effusion and infiltrate in the right lower lung fields. Electronically Signed   By: Elmer Picker M.D.   On: 05/01/2021 15:08   DG Chest Portable 1 View  Result Date: 04/22/2021 CLINICAL DATA:  Status post fall with vomiting and weakness. EXAM: PORTABLE CHEST 1 VIEW COMPARISON:  April 07, 2021 FINDINGS: Mild to moderate severity atelectasis and/or infiltrate is seen within the right lung base. There is a small, stable right pleural effusion. No pneumothorax is identified. The cardiac silhouette is mildly enlarged and unchanged in size. An artificial aortic valve is noted. A percutaneous biliary drainage catheter is seen overlying the right upper quadrant. Degenerative changes seen throughout the thoracic spine. IMPRESSION: 1. Mild to moderate severity right basilar atelectasis and/or infiltrate. 2. Small, stable right pleural effusion. Electronically Signed   By: Virgina Norfolk M.D.   On: 04/22/2021 19:19   DG Chest Port 1 View  Result Date: 04/07/2021 CLINICAL DATA:  Chest pain, cholecystostomy EXAM: PORTABLE CHEST 1 VIEW COMPARISON:  04/02/2021 chest radiograph. 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 Surgical clips overlie the lower right heart and left axilla. Partially visualized pigtail cholecystostomy tube in the right upper quadrant of the abdomen. Stable cardiomediastinal silhouette with borderline mild cardiomegaly. No pneumothorax. Slight blunting of the right costophrenic angle, unchanged, cannot exclude small right pleural effusion. No left pleural effusion. No overt pulmonary edema. Slightly low lung volumes. Patchy right lung base opacity, slightly improved. IMPRESSION: 1. Stable borderline mild cardiomegaly without overt pulmonary edema. 2. Possible small right pleural effusion,  unchanged. 3. Slightly improved patchy right lung base opacity. Electronically Signed   By: Ilona Sorrel M.D.   On: 04/07/2021 13:08   CT CHEST ABDOMEN PELVIS WO CONTRAST  Result Date: 04/22/2021 CLINICAL DATA:  81 year old male chest, abdominal and pelvic pain following fall. Currently on Plavix. Patient with percutaneous cholecystostomy tube. EXAM: CT CHEST, ABDOMEN AND PELVIS WITHOUT CONTRAST TECHNIQUE: Multidetector CT imaging of the chest, abdomen and pelvis was performed following the standard protocol without IV contrast. RADIATION DOSE REDUCTION: This exam was performed according to the departmental dose-optimization program which includes automated exposure control, adjustment of the mA and/or kV according to patient size and/or use of iterative reconstruction technique. COMPARISON:  04/27/2021 abdomen/pelvis CT, 04/02/2021 chest, abdomen and pelvic CT and prior studies FINDINGS: Please note that parenchymal and vascular abnormalities may be missed as intravenous contrast was not administered. CT CHEST FINDINGS Cardiovascular: Heart size is within normal limits. Aortic valve replacement and heavy coronary artery atherosclerotic calcifications again noted. Aortic atherosclerotic calcifications again identified without thoracic aortic aneurysm. No pericardial effusion. Mediastinum/Nodes: No mediastinal hematoma or mass. No enlarged lymph nodes are identified. The thyroid gland, trachea and esophagus are unremarkable. Lungs/Pleura: A small to moderate RIGHT pleural effusion is stable to slightly increased from 04/07/2021. RIGHT LOWER lung atelectasis is again identified. There is no evidence of airspace disease, definite mass, suspicious nodule, consolidation, pneumothorax or LEFT pleural effusion. A 3 mm LEFT UPPER lobe nodule is unchanged 2021. Musculoskeletal: No acute or suspicious bony abnormalities are noted. CT ABDOMEN PELVIS FINDINGS Hepatobiliary: The liver is unremarkable. Cholecystostomy tube  within the gallbladder is again noted. Cholelithiasis again identified. No evidence of biliary dilatation. Pancreas: Unremarkable Spleen: Unremarkable Adrenals/Urinary Tract: Moderate bilateral hydroureteronephrosis identified to the bladder without obstructing cause identified. Mild bladder distention is noted. A 2.7 cm LEFT adrenal mass is unchanged from 11/26/2019. The RIGHT adrenal gland is unremarkable.  Stomach/Bowel: Mild apparent circumferential rectal wall thickening is noted and may represent proctitis. There is no evidence of bowel obstruction or other bowel wall thickening. No other inflammatory changes are noted. The appendix is normal. Vascular/Lymphatic: Aortic atherosclerosis. No enlarged abdominal or pelvic lymph nodes. Reproductive: Prostate implants are present. Other: No ascites, focal collection or pneumoperitoneum. Musculoskeletal: No acute or suspicious bony abnormalities are noted. IMPRESSION: 1. No evidence of acute injury to the chest, abdomen or pelvis. 2. Mild apparent circumferential rectal wall thickening which may represent proctitis. Correlate clinically. 3. Moderate bilateral hydroureteronephrosis to the bladder without obstructing cause identified. This may be related to bladder distention/outlet obstruction. 4. Unchanged to slightly increased small to moderate RIGHT pleural effusion and RIGHT LOWER lung atelectasis. 5. Cholelithiasis and cholecystostomy tube again noted. 6. Unchanged LEFT adrenal mass. 7. 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 8. Aortic Atherosclerosis (ICD10-I70.0). Electronically Signed   By: Margarette Canada M.D.   On: 04/22/2021 20:21     Discharge Exam: Vitals:   05/05/21 0302 05/05/21 0752  BP: 122/66 (!) 142/99  Pulse: 82 90  Resp: 13 15  Temp: 98.3 F (36.8 C) 97.8 F (36.6 C)  SpO2: 100% 100%   Vitals:   05/04/21 2000 05/04/21 2340 05/05/21 0302 05/05/21 0752  BP: 124/76 116/62 122/66 (!) 142/99  Pulse: 89 95 82 90  Resp: (!) $RemoveB'22  17 13 15  'FsrVwkIz$ Temp:  98 F (36.7 C) 98.3 F (36.8 C) 97.8 F (36.6 C)  TempSrc:  Oral Oral Oral  SpO2: 100% 100% 100% 100%  Weight:      Height:        General: Pt is alert, awake, not in acute distress Cardiovascular: RRR, S1/S2 +, no rubs, no gallops Respiratory: CTA bilaterally, no wheezing, no rhonchi Abdominal: Soft, NT, ND, bowel sounds + Extremities: no edema, no cyanosis    The results of significant diagnostics from this hospitalization (including imaging, microbiology, ancillary and laboratory) are listed below for reference.     Microbiology: Recent Results (from the past 240 hour(s))  Resp Panel by RT-PCR (Flu A&B, Covid) Nasopharyngeal Swab     Status: None   Collection Time: 05/01/21  2:35 PM   Specimen: Nasopharyngeal Swab; Nasopharyngeal(NP) swabs in vial transport medium  Result Value Ref Range Status   SARS Coronavirus 2 by RT PCR NEGATIVE NEGATIVE Final    Comment: (NOTE) SARS-CoV-2 target nucleic acids are NOT DETECTED.  The SARS-CoV-2 RNA is generally detectable in upper respiratory specimens during the acute phase of infection. The lowest concentration of SARS-CoV-2 viral copies this assay can detect is 138 copies/mL. A negative result does not preclude SARS-Cov-2 infection and should not be used as the sole basis for treatment or other patient management decisions. A negative result may occur with  improper specimen collection/handling, submission of specimen other than nasopharyngeal swab, presence of viral mutation(s) within the areas targeted by this assay, and inadequate number of viral copies(<138 copies/mL). A negative result must be combined with clinical observations, patient history, and epidemiological information. The expected result is Negative.  Fact Sheet for Patients:  EntrepreneurPulse.com.au  Fact Sheet for Healthcare Providers:  IncredibleEmployment.be  This test is no t yet approved or cleared  by the [place] FDA and  has been authorized for detection and/or diagnosis of SARS-CoV-2 by FDA under an Emergency Use Authorization (EUA). This EUA will remain  in effect (meaning this test can be used) for the duration of the COVID-19 declaration under Section 564(b)(1) of the Act,  21 U.S.C.section 360bbb-3(b)(1), unless the authorization is terminated  or revoked sooner.       Influenza A by PCR NEGATIVE NEGATIVE Final   Influenza B by PCR NEGATIVE NEGATIVE Final    Comment: (NOTE) The Xpert Xpress SARS-CoV-2/FLU/RSV plus assay is intended as an aid in the diagnosis of influenza from Nasopharyngeal swab specimens and should not be used as a sole basis for treatment. Nasal washings and aspirates are unacceptable for Xpert Xpress SARS-CoV-2/FLU/RSV testing.  Fact Sheet for Patients: EntrepreneurPulse.com.au  Fact Sheet for Healthcare Providers: IncredibleEmployment.be  This test is not yet approved or cleared by the [place] FDA and has been authorized for detection and/or diagnosis of SARS-CoV-2 by FDA under an Emergency Use Authorization (EUA). This EUA will remain in effect (meaning this test can be used) for the duration of the COVID-19 declaration under Section 564(b)(1) of the Act, 21 U.S.C. section 360bbb-3(b)(1), unless the authorization is terminated or revoked.  Performed at [facility], [place] [address] [place], [place] [address]      Labs: BNP (last 3 results) No results for input(s): BNP in the last 8760 hours. Basic Metabolic Panel: Recent Labs  Lab 05/01/21 1450 05/02/21 0240 05/04/21 0641  NA 144 145 142  K 3.5 3.5 3.3*  CL 100 102 100  CO2 $Re'28 30 28  'snx$ GLUCOSE 127* 87 85  BUN 25* 27* 23  CREATININE 6.14* 6.56* 5.79*  CALCIUM 8.7* 8.7* 9.1  PHOS  --   --  4.0   Liver Function Tests: Recent Labs  Lab 05/01/21 1450 05/04/21 0641  AST 21  --   ALT <5  --   ALKPHOS 125  --   BILITOT 0.7  --    PROT 6.3*  --   ALBUMIN 2.5* 2.5*   No results for input(s): LIPASE, AMYLASE in the last 168 hours. Recent Labs  Lab 05/01/21 1450  AMMONIA 20   CBC: Recent Labs  Lab 05/01/21 1450 05/02/21 0240 05/04/21 0641  WBC 7.7 7.0 8.0  NEUTROABS 5.6  --   --   HGB 9.0* 8.8* 10.6*  HCT 28.6* 27.3* 32.8*  MCV 105.5* 105.0* 101.9*  PLT 282 284 297   Cardiac Enzymes: No results for input(s): CKTOTAL, CKMB, CKMBINDEX, TROPONINI in the last 168 hours. BNP: Invalid input(s): POCBNP CBG: Recent Labs  Lab 05/01/21 1433  GLUCAP 117*   D-Dimer No results for input(s): DDIMER in the last 72 hours. Hgb A1c No results for input(s): HGBA1C in the last 72 hours. Lipid Profile No results for input(s): CHOL, HDL, LDLCALC, TRIG, CHOLHDL, LDLDIRECT in the last 72 hours. Thyroid function studies No results for input(s): TSH, T4TOTAL, T3FREE, THYROIDAB in the last 72 hours.  Invalid input(s): FREET3 Anemia work up No results for input(s): VITAMINB12, FOLATE, FERRITIN, TIBC, IRON, RETICCTPCT in the last 72 hours. Urinalysis    Component Value Date/Time   COLORURINE YELLOW 03/27/2021 1130   APPEARANCEUR HAZY (A) 03/27/2021 1130   LABSPEC 1.008 03/27/2021 1130   PHURINE 8.0 03/27/2021 1130   GLUCOSEU 50 (A) 03/27/2021 1130   HGBUR MODERATE (A) 03/27/2021 1130   BILIRUBINUR NEGATIVE 03/27/2021 1130   BILIRUBINUR negative 02/19/2018 1643   KETONESUR NEGATIVE 03/27/2021 1130   PROTEINUR 100 (A) 03/27/2021 1130   UROBILINOGEN 0.2 02/19/2018 1643   UROBILINOGEN 1.0 08/22/2012 1402   NITRITE NEGATIVE 03/27/2021 1130   LEUKOCYTESUR LARGE (A) 03/27/2021 1130   Sepsis Labs Invalid input(s): PROCALCITONIN,  WBC,  LACTICIDVEN Microbiology Recent Results (from the past 240  hour(s))  Resp Panel by RT-PCR (Flu A&B, Covid) Nasopharyngeal Swab     Status: None   Collection Time: 05/01/21  2:35 PM   Specimen: Nasopharyngeal Swab; Nasopharyngeal(NP) swabs in vial transport medium  Result Value Ref  Range Status   SARS Coronavirus 2 by RT PCR NEGATIVE NEGATIVE Final    Comment: (NOTE) SARS-CoV-2 target nucleic acids are NOT DETECTED.  The SARS-CoV-2 RNA is generally detectable in upper respiratory specimens during the acute phase of infection. The lowest concentration of SARS-CoV-2 viral copies this assay can detect is 138 copies/mL. A negative result does not preclude SARS-Cov-2 infection and should not be used as the sole basis for treatment or other patient management decisions. A negative result may occur with  improper specimen collection/handling, submission of specimen other than nasopharyngeal swab, presence of viral mutation(s) within the areas targeted by this assay, and inadequate number of viral copies(<138 copies/mL). A negative result must be combined with clinical observations, patient history, and epidemiological information. The expected result is Negative.  Fact Sheet for Patients:  EntrepreneurPulse.com.au  Fact Sheet for Healthcare Providers:  IncredibleEmployment.be  This test is no t yet approved or cleared by the [place] FDA and  has been authorized for detection and/or diagnosis of SARS-CoV-2 by FDA under an Emergency Use Authorization (EUA). This EUA will remain  in effect (meaning this test can be used) for the duration of the COVID-19 declaration under Section 564(b)(1) of the Act, 21 U.S.C.section 360bbb-3(b)(1), unless the authorization is terminated  or revoked sooner.       Influenza A by PCR NEGATIVE NEGATIVE Final   Influenza B by PCR NEGATIVE NEGATIVE Final    Comment: (NOTE) The Xpert Xpress SARS-CoV-2/FLU/RSV plus assay is intended as an aid in the diagnosis of influenza from Nasopharyngeal swab specimens and should not be used as a sole basis for treatment. Nasal washings and aspirates are unacceptable for Xpert Xpress SARS-CoV-2/FLU/RSV testing.  Fact Sheet for  Patients: EntrepreneurPulse.com.au  Fact Sheet for Healthcare Providers: IncredibleEmployment.be  This test is not yet approved or cleared by the [place] FDA and has been authorized for detection and/or diagnosis of SARS-CoV-2 by FDA under an Emergency Use Authorization (EUA). This EUA will remain in effect (meaning this test can be used) for the duration of the COVID-19 declaration under Section 564(b)(1) of the Act, 21 U.S.C. section 360bbb-3(b)(1), unless the authorization is terminated or revoked.  Performed at [facility], [place] [address], [place], [place] [address]      Time coordinating discharge: Over 30 minutes  SIGNED:   Darliss Cheney, MD  [facility] 05/05/2021, 10:55 AM  If 7PM-7AM, please contact night-coverage [email or web address]

## 2021-05-05 NOTE — NC FL2 (Signed)
[place] LEVEL OF CARE SCREENING TOOL     IDENTIFICATION  Patient Name: Elijah White Birthdate: Mar 01, 1941 Sex: male Admission Date (Current Location): 05/01/2021  [facility] and [place] Number:  Herbalist and Address:  [facility], [place] [address], [place], [place] [address]      Provider Number: [id number]  Attending Physician Name and Address:  Darliss Cheney, MD  Relative Name and Phone Number:       Current Level of Care: Hospital Recommended Level of Care: [place] Prior Approval Number:    Date Approved/Denied:   PASRR Number: [id number]  Discharge Plan: SNF    Current Diagnoses: Patient Active Problem List   Diagnosis Date Noted   Pleural effusion, right 05/02/2021   Prolonged QT interval 05/02/2021   Encephalopathy acute 05/01/2021   Pressure ulcer of sacral region, stage 3 ([place]) 04/23/2021   Intractable nausea and vomiting 04/22/2021   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 03/28/2021 04/22/2021   Bilateral hydroureteronephrosis 04/22/2021   At risk for delirium 04/22/2021   Delirium 04/12/2021   Chronic hypotension 04/07/2021   Parkinson's disease ([place]) 04/07/2021   Acute urinary retention with foley  04/07/2021   Aspiration pneumonia ([place]) 04/07/2021   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 04/07/2021   Paroxysmal atrial fibrillation ([place]) 04/07/2021   Pressure injury of skin 03/28/2021   PAD (peripheral artery disease) ([place])    Complicated UTI (urinary tract infection)    Acute cholecystitis 03/15/2021   Hypokalemia 09/28/2020   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, 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[place]) 08/12/2020   Blindness/glaucoma  01/19/2020   Acute on chronic diastolic heart failure ([place]) 01/19/2020   RBBB    Hypercalcemia 12/16/2019   Near syncope 12/13/2019   Moderate protein-calorie malnutrition ([place]) 12/04/2019   Severe aortic stenosis s/p TAVR     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 ([place]) 11/26/2019   Macrocytic anemia 11/26/2019   2.5 cm left adrenal nodule 11/26/2019   Colonic mass 11/26/2019   Allergy, unspecified, initial encounter 11/16/2019   Anaphylactic shock, unspecified, initial encounter 11/16/2019   Other long term (current) drug therapy 09/05/2019   Iron deficiency anemia, unspecified 07/13/2019   Palliative care encounter 04/17/2019   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   Dependence on renal dialysis ([place]) 04/16/2019   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 kidney disease, or unspecified chronic kidney disease 04/16/2019   Localized edema 04/16/2019   Nicotine dependence, unspecified, uncomplicated 75/44/9201   Other disorders of phosphorus metabolism 04/16/2019   Pain, unspecified 04/16/2019   Pruritus, unspecified 04/16/2019   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/16/2019   Shortness of breath 04/16/2019   Unspecified glaucoma 04/16/2019   Unspecified urinary incontinence 04/16/2019   ARF (acute renal failure) ([place]) 03/25/2018   Vision loss of right eye 11/11/2017   Bilateral pseudophakia 07/11/2017   GERD (gastroesophageal reflux disease)/esophageal candidiasis  09/10/2016   HLD (hyperlipidemia) 09/10/2016   Essential hypertension 08/13/2016   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) 10/22/2006   COLONIC POLYPS, HX OF 10/22/2006    Orientation RESPIRATION BLADDER Height & Weight     Self, Situation, Place  Normal Incontinent, External catheter Weight: 140 lb 3.4 oz (63.6 kg) Height:  5\' 5"  (165.1 cm)  BEHAVIORAL SYMPTOMS/MOOD NEUROLOGICAL BOWEL NUTRITION STATUS      Incontinent Diet (See dc summary)  AMBULATORY STATUS COMMUNICATION OF NEEDS Skin  Extensive Assist Verbally PU Stage and Appropriate Care, Surgical wounds (Biliary tube; stage 3 on coccyx;closed incision on arm;non-pressure wound on  penis; puncture wound on abdomen)                       Personal Care Assistance Level of Assistance  Bathing, Feeding, Dressing Bathing Assistance: Maximum assistance Feeding assistance: Maximum assistance Dressing Assistance: Maximum assistance     Functional Limitations Info  Sight, Hearing, Speech Sight Info: Impaired Hearing Info: Adequate Speech Info: Adequate    SPECIAL CARE FACTORS FREQUENCY                       Contractures Contractures Info: Not present    Additional Factors Info  Code Status, Allergies, Insulin Sliding Scale Code Status Info: DNR Allergies Info: NKA   Insulin Sliding Scale Info: see dc summary       Current Medications (05/05/2021):  This is the current hospital active medication list Current Facility-Administered Medications  Medication Dose Route Frequency Provider Last Rate Last Admin   0.9 %  sodium chloride infusion  250 mL Intravenous PRN Chotiner, Yevonne Aline, MD       acetaminophen (TYLENOL) tablet 1,000 mg  1,000 mg Oral TID Philis Pique, NP   1,000 mg at 05/05/21 0917   atorvastatin (LIPITOR) tablet 10 mg  10 mg Oral q AM Pahwani, Einar Grad, MD   10 mg at 05/05/21 0600   brimonidine (ALPHAGAN) 0.15 % ophthalmic solution 1 drop  1 drop Right Eye TID Darliss Cheney, MD   1 drop at 05/05/21 0918   carbidopa-levodopa (SINEMET IR) 25-100 MG per tablet immediate release 1 tablet  1 tablet Oral TID Darliss Cheney, MD   1 tablet at 05/05/21 0917   chlorhexidine (PERIDEX) 0.12 % solution 15 mL  15 mL Mouth Rinse BID Chotiner, Yevonne Aline, MD   15 mL at 05/05/21 0928   Chlorhexidine Gluconate Cloth 2 % PADS 6 each  6 each Topical Q0600 Adelfa Koh, NP   6 each at 05/05/21 0540   clopidogrel (PLAVIX) tablet 75 mg  75 mg Oral Q breakfast Darliss Cheney, MD   75 mg at 05/05/21 1062   collagenase (SANTYL) ointment   Topical QID Kristopher Oppenheim, DO   1 application at 69/48/54 6270   Darbepoetin Alfa (ARANESP) injection 100 mcg  100 mcg  Intravenous Q Fri-HD Zeyfang, David, PA-C       dorzolamide-timolol (COSOPT) 22.3-6.8 MG/ML ophthalmic solution 1 drop  1 drop Right Eye BID Darliss Cheney, MD   1 drop at 05/05/21 0919   eye wash ((SODIUM/POTASSIUM/SOD CHLORIDE)) ophthalmic solution 1 drop  1 drop Left Eye Q12H PRN Darliss Cheney, MD       ferric citrate (AURYXIA) tablet 420 mg  420 mg Oral TID WC Darliss Cheney, MD   420 mg at 05/05/21 3500   Gerhardt's butt cream   Topical QID Kristopher Oppenheim, DO   Given at 05/05/21 9381   hydrOXYzine (ATARAX) tablet 25 mg  25 mg Oral Q6H PRN Madelon Lips, MD   25 mg at 05/04/21 0827   latanoprost (XALATAN) 0.005 % ophthalmic solution 1 drop  1 drop Right Eye QHS Pahwani, Ravi, MD       LORazepam (ATIVAN) injection 1 mg  1 mg Intravenous Q4H PRN Chotiner, Yevonne Aline, MD       MEDLINE mouth rinse  15 mL Mouth Rinse q12n4p Chotiner, Yevonne Aline, MD   15  mL at 05/04/21 1509   metoCLOPramide (REGLAN) tablet 5 mg  5 mg Oral BID PRN Darliss Cheney, MD   5 mg at 05/04/21 0930   midodrine (PROAMATINE) tablet 10 mg  10 mg Oral TID WC PRN Darliss Cheney, MD   10 mg at 05/05/21 1112   oxyCODONE (Oxy IR/ROXICODONE) immediate release tablet 5 mg  5 mg Oral Q6H PRN Darliss Cheney, MD   5 mg at 05/05/21 0547   pantoprazole (PROTONIX) EC tablet 40 mg  40 mg Oral BID Darliss Cheney, MD   40 mg at 05/05/21 0917   pilocarpine (PILOCAR) 4 % ophthalmic solution 1 drop  1 drop Right Eye QID Darliss Cheney, MD   1 drop at 05/05/21 0919   QUEtiapine (SEROQUEL) tablet 25 mg  25 mg Oral Q24H Darliss Cheney, MD   25 mg at 05/04/21 2100   senna-docusate (Senokot-S) tablet 1 tablet  1 tablet Oral QHS Darliss Cheney, MD   1 tablet at 05/02/21 2157   sodium chloride flush (NS) 0.9 % injection 3 mL  3 mL Intravenous Q12H Chotiner, Yevonne Aline, MD   3 mL at 05/05/21 6754   sodium chloride flush (NS) 0.9 % injection 3 mL  3 mL Intravenous PRN Chotiner, Yevonne Aline, MD         Discharge Medications: Please see discharge summary for a list of  discharge medications.  Relevant Imaging Results:  Relevant Lab Results:   Additional Information [phone]. Moderna COVID-19 Vaccine 05/14/2019 , 04/16/2019  Moderna Covid-19 Booster Vaccine 01/15/2020. Dialysis MWF at [facility]. Outpatient palliative care.  Benard Halsted, LCSW

## 2021-05-05 NOTE — Care Management Important Message (Signed)
Important Message ? ?Patient Details  ?Name: Elijah White ?MRN: [id number] ?Date of Birth: 12-25-1940 ? ? ?Medicare Important Message Given:  Yes ? ? ? ? ?Malu Pellegrini ?05/05/2021, 2:21 PM ?

## 2021-05-06 NOTE — TOC Transition Note (Signed)
Transition of Care (TOC) - CM/SW Discharge Note ? ? ?Patient Details  ?Name: Elijah White ?MRN: [id number] ?Date of Birth: Jul 02, 1940 ? ?Transition of Care (TOC) CM/SW Contact:  ?Emeterio Reeve, LCSW ?Phone Number: ?05/06/2021, 10:48 AM ? ? ?Clinical Narrative:    ? ?Per MD patient ready for DC to [facility]. RN to call report prior to discharge [phone]). RN, patient, patient's family, and facility notified of DC. Discharge Summary and FL2 sent to facility. DC packet on chart. Ambulance transport requested for patient.  ?  ?CSW will sign off for now as social work intervention is no longer needed. Please consult [place] again if new needs arise ? ?  ?Barriers to Discharge: No Barriers Identified ? ? ?Patient Goals and CMS Choice ?Patient states their goals for this hospitalization and ongoing recovery are:: SNF ?CMS Medicare.gov Compare Post Acute Care list provided to:: Patient Represenative (must comment) ?Choice offered to / list presented to : Adult Children ? ?Discharge Placement ?  ?Existing PASRR number confirmed : 05/05/21          ?[facility]: [facility] ?Patient to be transferred to facility by: [facility] ?Name of family member notified: Daughter ?Patient and family notified of of transfer: 05/05/21 ? ?Discharge Plan and Services ?In-house Referral: Clinical Social Work ?  ?Post Acute Care Choice: [place], Dialysis          ?  ?  ?  ?  ?  ?  ?  ?  ?  ?  ? ?Social Determinants of Health (SDOH) Interventions ?  ? ? ?Readmission Risk Interventions ?Readmission Risk Prevention Plan 05/05/2021 04/10/2021 03/17/2021  ?Transportation Screening Complete Complete Complete  ?Medication Review Press photographer) Complete Complete -  ?PCP or Specialist appointment within 3-5 days of discharge Complete Complete -  ?[place] or Home Care Consult Complete Complete Complete  ?SW Recovery Care/Counseling Consult Complete Complete Complete  ?Palliative Care Screening Complete Not Applicable -   ?Skilled Nursing Facility Complete Complete Complete  ?Some recent data might be hidden  ? ? ?Emeterio Reeve, LCSW ?Clinical Social Worker ? ? ? ?

## 2021-05-06 NOTE — Progress Notes (Signed)
Pt returning to [facility] today, social worker notified daughter of return. Called report to nurse at [facility]. Awaiting [facility] for pickup.  ?

## 2021-05-06 NOTE — Discharge Summary (Signed)
PatientPhysician Discharge Summary  Elijah White DBZ:[id number] DOB: 1941/02/13 DOA: 05/01/2021  PCP: Maryella Shivers, MD  Admit date: 05/01/2021 Discharge date: 05/06/2021 30 Day Unplanned Readmission Risk Score    Flowsheet Row ED to Hosp-Admission (Current) from 05/01/2021 in [facility]  30 Day Unplanned Readmission Risk Score (%) 43.53 Filed at 05/05/2021 0801       This score is the patient's risk of an unplanned readmission within 30 days of being discharged (0 -100%). The score is based on dignosis, age, lab data, medications, orders, and past utilization.   Low:  0-14.9   Medium: 15-21.9   High: 22-29.9   Extreme: 30 and above          Admitted From: SNF Disposition: SNF  Recommendations for Outpatient Follow-up:  Follow up with PCP in 1-2 weeks Please obtain BMP/CBC in one week Please follow up with your PCP on the following pending results: Unresulted Labs (From admission, onward)     Start     Ordered   05/02/21 0745  Urine rapid drug screen (hosp performed)  ONCE - STAT,   STAT        05/02/21 0744   05/01/21 1347  Urinalysis, Routine w reflex microscopic  (ED ALOC)  Once,   STAT        05/01/21 [place]: None Equipment/Devices: None  Discharge Condition: Stable CODE STATUS: DNR Diet recommendation: Renal  Subjective: Seen and examined.  He is fully alert and oriented.  He has no complaints.  Brief/Interim Summary: Elijah White is a 81 y.o. 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 One hour into his session he became agitated and was aggressive to staff. He was biting, scratching, trying to hit staff.  HD was stopped and he  was sent to the emergency room.  He remained agitated in the emergency room and was given 2 doses of Geodon.  CT of the head was obtained which was negative for acute pathology.  He remained altered in the emergency room.  His ammonia level was normal.  Percutaneous drain for his chronic cholecystitis is still in place.  He is afebrile.  There is no report of seizure activity.  He was admitted under hospitalist service.   Encephalopathy acute/delirium- (present on admission): CT head followed by MRI brain both negative.  Likely delirium.  This was treated with nightly Seroquel and delirium precautions and he is back to his baseline, fully alert and oriented.  He is going to be discharged back to his facility today.   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 [facility]): He received his dialysis off schedule, yesterday and day before.  Daughter is concerned that he may not be able to do well if dialysis is waited until Monday.  Per her insistence, nephrology is going to do 2-hour dialysis today and then patient will be discharged to SNF today.   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   Hypotension: I do not see any history of hypertension and is not on any hypertensives as well.  In fact his blood pressure  is low and he is on midodrine which is resumed.   Anemia in chronic kidney disease- (present on admission): Stable   Pleural effusion, right: Very mild.  Does not need thoracentesis as he is not hypoxic and he is getting dialysis so volume should be handled by HD.  X-ray also indicates possible right lower lobe infiltrates but I doubt pneumonia.  Reportedly he was diagnosed with aspiration pneumonia as outpatient and prescribed Augmentin on 04/25/2021 which we will continue and only 1 day more dose left.   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 Stable.   Prolonged QT  interval Avoid medications which can further prolong QT interval  Please note that patient was discharged yesterday but for some reason [facility] was unable to provide transportation until 10 PM which was very late for the facility to accept so patient ended up staying in the hospital and extra night and when seen today, he remains alert and oriented and as stable as he was yesterday so he will be discharged today .  Per TOC, transportation will be arranged today.  Discharge plan was discussed with the daughter and she verbalized understanding and agreed with it.  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)   Essential hypertension   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HCC)   Anemia in chronic kidney disease   Delirium   Bilateral hydroureteronephrosis   Pleural effusion, right   Prolonged QT interval    Discharge Instructions   Allergies as of 05/06/2021   No Known Allergies      Medication List     STOP taking these medications    amoxicillin-clavulanate 500-125 MG tablet Commonly known as: Augmentin   amoxicillin-clavulanate 875-125 MG tablet Commonly known as: AUGMENTIN   hydrOXYzine 25 MG tablet Commonly known as: ATARAX   nystatin 100000 UNIT/ML suspension Commonly known as: MYCOSTATIN       TAKE these medications    acetaminophen 500 MG tablet Commonly known as: TYLENOL Take 2 tablets (1,000 mg total) by mouth every 8 (eight) hours as needed for moderate pain or headache.   atorvastatin 10 MG tablet Commonly known as: LIPITOR Take 1 tablet (10 mg total) by mouth in the morning. What changed: when to take this   Baclofen 5 MG Tabs Take 5 mg by mouth 3 (three) times daily as needed (hiccups). What changed: Another medication with the same name was removed. Continue taking this medication, and follow the directions you see here.   blood glucose meter kit and  supplies Kit Dispense based on patient and insurance preference. Use up to four times daily as directed. (FOR ICD-9 250.00, 250.01). For QAC - HS accuchecks.   brimonidine 0.15 % ophthalmic solution Commonly known as: ALPHAGAN Place 1 drop into the right eye 3 (three) times daily.   carbidopa-levodopa 25-100 MG tablet Commonly known as: Sinemet Take 1 tablet by mouth 3 (three) times daily. What changed:  when to take this additional instructions   cinacalcet 30 MG tablet Commonly known as: SENSIPAR Take 2 tablets (60 mg total) by mouth every Monday, Wednesday, and Friday with hemodialysis.   clopidogrel 75 MG tablet Commonly known as: PLAVIX Take 1 tablet (75 mg total) by mouth daily with breakfast.   Dialyvite 800 0.8 MG Tabs Take 1 tablet by mouth daily.   dorzolamide-timolol 22.3-6.8 MG/ML ophthalmic solution Commonly known as: COSOPT Place 1 drop into the right eye 2 (two)  times daily.   ferric citrate 1 GM 210 MG(Fe) tablet Commonly known as: AURYXIA Take 420 mg by mouth 3 (three) times daily with meals.   gabapentin 100 MG capsule Commonly known as: Neurontin Take 1 capsule (100 mg total) by mouth daily.   GenTeal Severe 0.3 % Gel ophthalmic ointment Generic drug: hypromellose Place 1 application into the left eye every 12 (twelve) hours as needed for dry eyes.   melatonin 5 MG Tabs Take 5 mg by mouth at bedtime.   metoCLOPramide 5 MG tablet Commonly known as: REGLAN Take 5 mg by mouth 2 (two) times daily as needed for nausea or vomiting. What changed: Another medication with the same name was removed. Continue taking this medication, and follow the directions you see here.   midodrine 10 MG tablet Commonly known as: PROAMATINE Take 1 tablet (10 mg total) by mouth 3 (three) times daily with meals as needed (SBP <100).   ondansetron 4 MG tablet Commonly known as: ZOFRAN Take 4 mg by mouth every 8 (eight) hours as needed for nausea or vomiting.   OneTouch  Verio test strip Generic drug: glucose blood USE AS DIRECTED TO TEST FOUR TIMES A DAY   OVER THE COUNTER MEDICATION Take 1 Package by mouth with breakfast, with lunch, and with evening meal. Magic cup or mighty shake   pantoprazole 40 MG tablet Commonly known as: PROTONIX Take 1 tablet (40 mg total) by mouth 2 (two) times daily.   pilocarpine 4 % ophthalmic solution Commonly known as: PILOCAR Place 1 drop into the right eye 4 (four) times daily.   polyethylene glycol 17 g packet Commonly known as: MIRALAX / GLYCOLAX Take 17 g by mouth daily. What changed: additional instructions   PROSTAT PO Take 30 mLs by mouth in the morning and at bedtime.   Rocklatan 0.02-0.005 % Soln Generic drug: Netarsudil-Latanoprost Place 1 drop into the right eye at bedtime.   senna-docusate 8.6-50 MG tablet Commonly known as: Senokot-S Take 1 tablet by mouth at bedtime.   Vitamin D (Ergocalciferol) 1.25 MG (50000 UNIT) Caps capsule Commonly known as: DRISDOL Take 50,000 Units by mouth See admin instructions. Every Monday x 8 weeks         Contact information for follow-up providers     Maryella Shivers, MD Follow up in 1 week(s).   Specialty: Family Medicine Contact information: [place] [address] [phone]         Geralynn Rile, MD .   Specialties: Cardiology, Internal Medicine, Radiology Contact information: [place] [address] [phone]              Contact information for after-discharge care     Destination     [facility] Preferred SNF .   Service: Skilled Nursing Contact information: [address] [place] [phone]                    No Known Allergies  Consultations: Nephrology   Procedures/Studies: CT Head Wo Contrast  Result Date: 05/01/2021 CLINICAL DATA:  Altered mental status EXAM: CT HEAD WITHOUT CONTRAST TECHNIQUE:  Contiguous axial images were obtained from the base of the skull through the vertex without intravenous contrast. RADIATION DOSE REDUCTION: This exam was performed according to the departmental dose-optimization program which includes automated exposure control, adjustment of the mA and/or kV according to patient size and/or use of iterative reconstruction technique. COMPARISON:  CT head 04/22/2021  FINDINGS: Brain: No acute intracranial hemorrhage, mass effect, or herniation. No extra-axial fluid collections. No evidence of acute territorial infarct. No hydrocephalus. Mild-to-moderate cortical volume loss. Patchy hypodensities throughout the periventricular and subcortical white matter, likely secondary to chronic microvascular ischemic changes. 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 Small old infarct in the right cerebellum. Vascular: Calcified plaques in the carotid siphons. Skull: Normal. Negative for fracture or focal lesion. Sinuses/Orbits: No acute finding. Other: None. IMPRESSION: No acute intracranial process identified. 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 Electronically Signed   By: Ofilia Neas M.D.   On: 05/01/2021 16:31   CT Head Wo Contrast  Result Date: 04/22/2021 CLINICAL DATA:  81 year old male with head and neck injury following fall. Vomiting. Currently on Plavix. EXAM: CT HEAD WITHOUT CONTRAST CT CERVICAL SPINE WITHOUT CONTRAST TECHNIQUE: Multidetector CT imaging of the head and cervical spine was performed following the standard protocol without intravenous contrast. Multiplanar CT image reconstructions of the cervical spine were also generated. RADIATION DOSE REDUCTION: This exam was performed according to the departmental dose-optimization program which includes automated exposure control, adjustment of the mA and/or kV according to patient size and/or use of iterative reconstruction technique. COMPARISON:  10/19/2020 CT and prior studies  FINDINGS: CT HEAD FINDINGS Brain: No evidence of acute infarction, hemorrhage, hydrocephalus, extra-axial collection or mass lesion/mass effect. Atrophy, chronic small-vessel white matter ischemic changes, and remote bilateral basal ganglia, thalamic and RIGHT cerebellar infarcts noted. Vascular: Carotid and vertebral atherosclerotic calcifications are noted. Skull: Normal. Negative for fracture or focal lesion. Sinuses/Orbits: No acute abnormality. LEFT orbital surgical changes again noted. Other: RIGHT frontal scalp soft tissue swelling is identified. CT CERVICAL SPINE FINDINGS Alignment: Normal. Skull base and vertebrae: No acute fracture. No primary bone lesion or focal pathologic process. Soft tissues and spinal canal: No prevertebral fluid or swelling. No visible canal hematoma. Disc levels: Multilevel degenerative disc disease/spondylosis and facet arthropathy are unchanged. Upper chest: A RIGHT pleural effusion is noted and will be discussed in the chest CT. Other: A sebaceous cyst in the subcutaneous tissues of the posterior neck is again noted. IMPRESSION: 1. No evidence of acute intracranial abnormality. Atrophy, chronic small-vessel white matter ischemic changes and remote infarcts. 2. RIGHT frontal scalp soft tissue swelling without fracture. 3. No static evidence of acute injury to the cervical spine. 4. RIGHT pleural effusion. Electronically Signed   By: Margarette Canada M.D.   On: 04/22/2021 20:09   CT Cervical Spine Wo Contrast  Result Date: 04/22/2021 CLINICAL DATA:  81 year old male with head and neck injury following fall. Vomiting. Currently on Plavix. EXAM: CT HEAD WITHOUT CONTRAST CT CERVICAL SPINE WITHOUT CONTRAST TECHNIQUE: Multidetector CT imaging of the head and cervical spine was performed following the standard protocol without intravenous contrast. Multiplanar CT image reconstructions of the cervical spine were also generated. RADIATION DOSE REDUCTION: This exam was performed according  to the departmental dose-optimization program which includes automated exposure control, adjustment of the mA and/or kV according to patient size and/or use of iterative reconstruction technique. COMPARISON:  10/19/2020 CT and prior studies FINDINGS: CT HEAD FINDINGS Brain: No evidence of acute infarction, hemorrhage, hydrocephalus, extra-axial collection or mass lesion/mass effect. Atrophy, chronic small-vessel white matter ischemic changes, and remote bilateral basal ganglia, thalamic and RIGHT cerebellar infarcts noted. Vascular: Carotid and vertebral atherosclerotic calcifications are noted. Skull: Normal. Negative for fracture or focal lesion. Sinuses/Orbits: No acute abnormality. LEFT orbital surgical changes again noted. Other: RIGHT frontal scalp soft tissue swelling is identified. CT  CERVICAL SPINE FINDINGS Alignment: Normal. Skull base and vertebrae: No acute fracture. No primary bone lesion or focal pathologic process. Soft tissues and spinal canal: No prevertebral fluid or swelling. No visible canal hematoma. Disc levels: Multilevel degenerative disc disease/spondylosis and facet arthropathy are unchanged. Upper chest: A RIGHT pleural effusion is noted and will be discussed in the chest CT. Other: A sebaceous cyst in the subcutaneous tissues of the posterior neck is again noted. IMPRESSION: 1. No evidence of acute intracranial abnormality. Atrophy, chronic small-vessel white matter ischemic changes and remote infarcts. 2. RIGHT frontal scalp soft tissue swelling without fracture. 3. No static evidence of acute injury to the cervical spine. 4. RIGHT pleural effusion. Electronically Signed   By: Margarette Canada M.D.   On: 04/22/2021 20:09   MR BRAIN WO CONTRAST  Result Date: 05/02/2021 CLINICAL DATA:  81 year old male with End stage renal disease. Delirium. EXAM: MRI HEAD WITHOUT CONTRAST TECHNIQUE: Multiplanar, multiecho pulse sequences of the brain and surrounding structures were obtained without  intravenous contrast. COMPARISON:  Head CT yesterday.  Brain MRI 08/13/2016. FINDINGS: Brain: No restricted diffusion or evidence of acute infarction. Small chronic infarcts in the right cerebellum and bilateral thalami are new since 2018. Associated progressed, now Patchy and confluent bilateral cerebral white matter T2 and FLAIR hyperintensity. New T2 heterogeneity in the bilateral basal ganglia. Multiple chronic microhemorrhages in the brain, mostly in and around the thalami. There is also a small focus of chronic hemosiderin in the inferior right parietal lobe. No definite cortical encephalomalacia. No midline shift, mass effect, evidence of mass lesion, ventriculomegaly, extra-axial collection or acute intracranial hemorrhage. Cervicomedullary junction and pituitary are within normal limits. Vascular: Major intracranial vascular flow voids are stable since 2018, with suspected slow flow in the right sigmoid sinus and IJ bulb, and a degree of generalized intracranial artery tortuosity. Skull and upper cervical spine: Negative for age visible cervical spine. Normal bone marrow signal. Sinuses/Orbits: Postoperative changes to the left globe since 2018. Otherwise stable and negative. Other: Mastoids remain clear. Grossly normal visible internal auditory structures. Chronic midline suboccipital subcutaneous cyst or seroma appears slightly smaller since 2018. IMPRESSION: 1. No acute intracranial abnormality. 2. Advanced chronic small vessel disease, substantially progressed since a 2018 MRI. Electronically Signed   By: Genevie Ann M.D.   On: 05/02/2021 07:10   CT ABDOMEN PELVIS W CONTRAST  Result Date: 05/01/2021 CLINICAL DATA:  Abdominal pain, acute, nonlocalized EXAM: CT ABDOMEN AND PELVIS WITH CONTRAST TECHNIQUE: Multidetector CT imaging of the abdomen and pelvis was performed using the standard protocol following bolus administration of intravenous contrast. RADIATION DOSE REDUCTION: This exam was performed  according to the departmental dose-optimization program which includes automated exposure control, adjustment of the mA and/or kV according to patient size and/or use of iterative reconstruction technique. CONTRAST:  16mL OMNIPAQUE IOHEXOL 300 MG/ML  SOLN COMPARISON:  04/22/2021 FINDINGS: Lower chest: Moderate to large right pleural effusion, stable. Right lower lobe atelectasis also stable. Prior TAVR. Coronary artery and aortic calcifications. Hepatobiliary: Gallbladder is distended with layering stones noted. Cholecystostomy tube again noted within the gallbladder, unchanged. No biliary ductal dilatation. No focal hepatic abnormality. Pancreas: No focal abnormality or ductal dilatation. Spleen: No focal abnormality.  Normal size. Adrenals/Urinary Tract: Moderate left hydronephrosis and severe right hydronephrosis. No visible obstructing process. Findings unchanged since prior study. Urinary bladder grossly unremarkable. Adrenal glands unremarkable. Stomach/Bowel: Previously seen rectal wall thickening not as apparent on today's study. Moderate stool throughout the colon. Nonobstructive bowel gas pattern. Vascular/Lymphatic: Aortoiliac atherosclerosis.  No evidence of aneurysm or adenopathy. Reproductive: Radiation seeds in the prostate. Other: No free fluid or free air. Musculoskeletal: No acute bony abnormality. IMPRESSION: Cholelithiasis. Cholecystostomy tube remains in the gallbladder. Mild gallbladder distension. Moderate to large right pleural effusion with right lower lobe atelectasis, stable. Moderate to severe bilateral hydronephrosis, stable since prior study. No obstructing cause visualized. Aortoiliac atherosclerosis. Moderate stool burden throughout the colon. Electronically Signed   By: Rolm Baptise M.D.   On: 05/01/2021 23:07   CT ABDOMEN PELVIS W CONTRAST  Result Date: 04/07/2021 CLINICAL DATA:  Abdominal pain, acute, nonlocalized. EXAM: CT ABDOMEN AND PELVIS WITH CONTRAST TECHNIQUE:  Multidetector CT imaging of the abdomen and pelvis was performed using the standard protocol following bolus administration of intravenous contrast. RADIATION DOSE REDUCTION: This exam was performed according to the departmental dose-optimization program which includes automated exposure control, adjustment of the mA and/or kV according to patient size and/or use of iterative reconstruction technique. CONTRAST:  167mL OMNIPAQUE IOHEXOL 350 MG/ML SOLN COMPARISON:  04/02/2021 FINDINGS: Lower chest: Moderate right pleural effusion has slightly increased in size with progressive, compressive atelectasis of the right lower lobe. Tiny left pleural effusion with minimal left basilar compressive atelectasis. Extensive multi-vessel coronary artery calcification again noted. 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 Cardiac size within normal limits. Hepatobiliary: Percutaneous cholecystostomy is unchanged with its retaining loop seen within the gallbladder lumen. The gallbladder, however, remains distended suggesting inadequate drainage of gallbladder contents and/or occlusion of the catheter. Previously noted hyperdensity related to hemorrhagic intraluminal contents is not as well appreciated on this examination possibly related to partial lysis of a blood product. Layering gallstones are again identified within the gallbladder lumen. Liver otherwise unremarkable. No intra or extrahepatic biliary ductal dilation. Pancreas: Unremarkable Spleen: Unremarkable Adrenals/Urinary Tract: 2.1 x 2.5 cm heterogeneously enhancing left adrenal nodule is again identified and appears stable since remote prior examination of 11/26/2019, though is indeterminate. The right adrenal gland is unremarkable. Moderate bilateral renal cortical atrophy again noted. No enhancing intrarenal masses are identified. No hydronephrosis. No intrarenal or ureteral calculi. Vascular calcifications are noted within the renal hila bilaterally.  The bladder is decompressed with a Foley catheter balloon seen within its lumen. Stomach/Bowel: Stomach is within normal limits. Appendix appears normal. No evidence of bowel wall thickening, distention, or inflammatory changes. Vascular/Lymphatic: Extensive aortoiliac atherosclerotic calcification with probable hemodynamically significant stenoses involving the lower extremity arterial inflow bilaterally. This is unchanged and not optimally assessed on this non arteriographic examination. No aortic aneurysm. No pathologic adenopathy within the abdomen and pelvis. Reproductive: Brachytherapy seeds are seen within the prostate gland Other: There is progressive moderate diffuse subcutaneous body wall edema, particularly within the right flank which may relate to dependent positioning or superimposed local trauma or inflammation. Small fat containing left inguinal hernia. Rectum unremarkable. Musculoskeletal: No acute bone abnormality. No lytic or blastic bone lesion. IMPRESSION: Stable examination with percutaneous cholecystostomy in place but persistent distension of the gallbladder suggesting inadequate drainage of complex intraluminal contents and/or catheter occlusion. Evolution of intraluminal blood product within the gallbladder lumen noted. Layering gallstones again identified. Progressive anasarca with enlarging pleural effusions and progressive diffuse body wall subcutaneous edema. Asymmetric right flank subcutaneous body wall edema which may relate to dependent positioning or superimposed local trauma or inflammation. Correlation with clinical examination is recommended. 2.5 cm heterogeneously enhancing left adrenal nodule. While stable since remote prior examination of 11/26/2019, a malignant lesion such as a pheochromocytoma, is not excluded and dedicated adrenal mass protocol CT or MRI  examination is recommended once the patient's acute issues have resolved. Peripheral vascular disease with suspected  bilateral lower extremity arterial inflow stenoses. This would be better assessed with CT arteriography if there is a clinical history of a lower extremity claudication or arterial vascular insufficiency. Aortic Atherosclerosis (ICD10-I70.0). Electronically Signed   By: Fidela Salisbury M.D.   On: 04/07/2021 19:22   DG Chest Portable 1 View  Result Date: 05/01/2021 CLINICAL DATA:  Altered mental status, difficulty breathing EXAM: PORTABLE CHEST 1 VIEW COMPARISON:  04/22/2021 FINDINGS: Transverse diameter of heart is increased. There is opacification of right lower lung fields suggesting right pleural effusion and possibly underlying infiltrate. This finding has not changed significantly. There are no signs of alveolar pulmonary edema or new focal infiltrates. There is no pneumothorax. IMPRESSION: No significant interval changes are noted in the right pleural effusion and infiltrate in the right lower lung fields. Electronically Signed   By: Elmer Picker M.D.   On: 05/01/2021 15:08   DG Chest Portable 1 View  Result Date: 04/22/2021 CLINICAL DATA:  Status post fall with vomiting and weakness. EXAM: PORTABLE CHEST 1 VIEW COMPARISON:  April 07, 2021 FINDINGS: Mild to moderate severity atelectasis and/or infiltrate is seen within the right lung base. There is a small, stable right pleural effusion. No pneumothorax is identified. The cardiac silhouette is mildly enlarged and unchanged in size. An artificial aortic valve is noted. A percutaneous biliary drainage catheter is seen overlying the right upper quadrant. Degenerative changes seen throughout the thoracic spine. IMPRESSION: 1. Mild to moderate severity right basilar atelectasis and/or infiltrate. 2. Small, stable right pleural effusion. Electronically Signed   By: Virgina Norfolk M.D.   On: 04/22/2021 19:19   DG Chest Port 1 View  Result Date: 04/07/2021 CLINICAL DATA:  Chest pain, cholecystostomy EXAM: PORTABLE CHEST 1 VIEW COMPARISON:   04/02/2021 chest radiograph. 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 Surgical clips overlie the lower right heart and left axilla. Partially visualized pigtail cholecystostomy tube in the right upper quadrant of the abdomen. Stable cardiomediastinal silhouette with borderline mild cardiomegaly. No pneumothorax. Slight blunting of the right costophrenic angle, unchanged, cannot exclude small right pleural effusion. No left pleural effusion. No overt pulmonary edema. Slightly low lung volumes. Patchy right lung base opacity, slightly improved. IMPRESSION: 1. Stable borderline mild cardiomegaly without overt pulmonary edema. 2. Possible small right pleural effusion, unchanged. 3. Slightly improved patchy right lung base opacity. Electronically Signed   By: Ilona Sorrel M.D.   On: 04/07/2021 13:08   CT CHEST ABDOMEN PELVIS WO CONTRAST  Result Date: 04/22/2021 CLINICAL DATA:  81 year old male chest, abdominal and pelvic pain following fall. Currently on Plavix. Patient with percutaneous cholecystostomy tube. EXAM: CT CHEST, ABDOMEN AND PELVIS WITHOUT CONTRAST TECHNIQUE: Multidetector CT imaging of the chest, abdomen and pelvis was performed following the standard protocol without IV contrast. RADIATION DOSE REDUCTION: This exam was performed according to the departmental dose-optimization program which includes automated exposure control, adjustment of the mA and/or kV according to patient size and/or use of iterative reconstruction technique. COMPARISON:  04/27/2021 abdomen/pelvis CT, 04/02/2021 chest, abdomen and pelvic CT and prior studies FINDINGS: Please note that parenchymal and vascular abnormalities may be missed as intravenous contrast was not administered. CT CHEST FINDINGS Cardiovascular: Heart size is within normal limits. Aortic valve replacement and heavy coronary artery atherosclerotic calcifications again noted. Aortic atherosclerotic calcifications again identified without thoracic aortic  aneurysm. No pericardial effusion. Mediastinum/Nodes: No mediastinal hematoma or mass. No enlarged lymph nodes are identified.  The thyroid gland, trachea and esophagus are unremarkable. Lungs/Pleura: A small to moderate RIGHT pleural effusion is stable to slightly increased from 04/07/2021. RIGHT LOWER lung atelectasis is again identified. There is no evidence of airspace disease, definite mass, suspicious nodule, consolidation, pneumothorax or LEFT pleural effusion. A 3 mm LEFT UPPER lobe nodule is unchanged 2021. Musculoskeletal: No acute or suspicious bony abnormalities are noted. CT ABDOMEN PELVIS FINDINGS Hepatobiliary: The liver is unremarkable. Cholecystostomy tube within the gallbladder is again noted. Cholelithiasis again identified. No evidence of biliary dilatation. Pancreas: Unremarkable Spleen: Unremarkable Adrenals/Urinary Tract: Moderate bilateral hydroureteronephrosis identified to the bladder without obstructing cause identified. Mild bladder distention is noted. A 2.7 cm LEFT adrenal mass is unchanged from 11/26/2019. The RIGHT adrenal gland is unremarkable. Stomach/Bowel: Mild apparent circumferential rectal wall thickening is noted and may represent proctitis. There is no evidence of bowel obstruction or other bowel wall thickening. No other inflammatory changes are noted. The appendix is normal. Vascular/Lymphatic: Aortic atherosclerosis. No enlarged abdominal or pelvic lymph nodes. Reproductive: Prostate implants are present. Other: No ascites, focal collection or pneumoperitoneum. Musculoskeletal: No acute or suspicious bony abnormalities are noted. IMPRESSION: 1. No evidence of acute injury to the chest, abdomen or pelvis. 2. Mild apparent circumferential rectal wall thickening which may represent proctitis. Correlate clinically. 3. Moderate bilateral hydroureteronephrosis to the bladder without obstructing cause identified. This may be related to bladder distention/outlet obstruction. 4.  Unchanged to slightly increased small to moderate RIGHT pleural effusion and RIGHT LOWER lung atelectasis. 5. Cholelithiasis and cholecystostomy tube again noted. 6. Unchanged LEFT adrenal mass. 7. 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 8. Aortic Atherosclerosis (ICD10-I70.0). Electronically Signed   By: Margarette Canada M.D.   On: 04/22/2021 20:21     Discharge Exam: Vitals:   05/06/21 0442 05/06/21 0800  BP: (!) 145/71 112/61  Pulse: 85 88  Resp: 14 15  Temp: 97.8 F (36.6 C)   SpO2: 100% 100%   Vitals:   05/05/21 2011 05/06/21 0035 05/06/21 0442 05/06/21 0800  BP: (!) 144/86 116/69 (!) 145/71 112/61  Pulse: 87 84 85 88  Resp: _0 Temp: 98.3 F (36.8 C) 97.8 F (36.6 C) 97.8 F (36.6 C)   TempSrc: Oral Oral Oral   SpO2: 100% 100% 100% 100%  Weight:      Height:        General: Pt is alert, awake, not in acute distress Cardiovascular: RRR, S1/S2 +, no rubs, no gallops Respiratory: CTA bilaterally, no wheezing, no rhonchi Abdominal: Soft, NT, ND, bowel sounds + Extremities: no edema, no cyanosis    The results of significant diagnostics from this hospitalization (including imaging, microbiology, ancillary and laboratory) are listed below for reference.     Microbiology: Recent Results (from the past 240 hour(s))  Resp Panel by RT-PCR (Flu A&B, Covid) Nasopharyngeal Swab     Status: None   Collection Time: 05/01/21  2:35 PM   Specimen: Nasopharyngeal Swab; Nasopharyngeal(NP) swabs in vial transport medium  Result Value Ref Range Status   SARS Coronavirus 2 by RT PCR NEGATIVE NEGATIVE Final    Comment: (NOTE) SARS-CoV-2 target nucleic acids are NOT DETECTED.  The SARS-CoV-2 RNA is generally detectable in upper respiratory specimens during the acute phase of infection. The lowest concentration of SARS-CoV-2 viral copies this assay can detect is 138 copies/mL. A negative result does not preclude SARS-Cov-2 infection and should not be used as the  sole basis for treatment or other patient management decisions. A negative result  may occur with  improper specimen collection/handling, submission of specimen other than nasopharyngeal swab, presence of viral mutation(s) within the areas targeted by this assay, and inadequate number of viral copies(<138 copies/mL). A negative result must be combined with clinical observations, patient history, and epidemiological information. The expected result is Negative.  Fact Sheet for Patients:  EntrepreneurPulse.com.au  Fact Sheet for Healthcare Providers:  IncredibleEmployment.be  This test is no t yet approved or cleared by the [place] FDA and  has been authorized for detection and/or diagnosis of SARS-CoV-2 by FDA under an Emergency Use Authorization (EUA). This EUA will remain  in effect (meaning this test can be used) for the duration of the COVID-19 declaration under Section 564(b)(1) of the Act, 21 U.S.C.section 360bbb-3(b)(1), unless the authorization is terminated  or revoked sooner.       Influenza A by PCR NEGATIVE NEGATIVE Final   Influenza B by PCR NEGATIVE NEGATIVE Final    Comment: (NOTE) The Xpert Xpress SARS-CoV-2/FLU/RSV plus assay is intended as an aid in the diagnosis of influenza from Nasopharyngeal swab specimens and should not be used as a sole basis for treatment. Nasal washings and aspirates are unacceptable for Xpert Xpress SARS-CoV-2/FLU/RSV testing.  Fact Sheet for Patients: EntrepreneurPulse.com.au  Fact Sheet for Healthcare Providers: IncredibleEmployment.be  This test is not yet approved or cleared by the [place] FDA and has been authorized for detection and/or diagnosis of SARS-CoV-2 by FDA under an Emergency Use Authorization (EUA). This EUA will remain in effect (meaning this test can be used) for the duration of the COVID-19 declaration under Section 564(b)(1) of the  Act, 21 U.S.C. section 360bbb-3(b)(1), unless the authorization is terminated or revoked.  Performed at [facility], [place] [address] [place], [place] [address]      Labs: BNP (last 3 results) No results for input(s): BNP in the last 8760 hours. Basic Metabolic Panel: Recent Labs  Lab 05/01/21 1450 05/02/21 0240 05/04/21 0641  NA 144 145 142  K 3.5 3.5 3.3*  CL 100 102 100  CO2 _0 GLUCOSE 127* 87 85  BUN 25* 27* 23  CREATININE 6.14* 6.56* 5.79*  CALCIUM 8.7* 8.7* 9.1  PHOS  --   --  4.0   Liver Function Tests: Recent Labs  Lab 05/01/21 1450 05/04/21 0641  AST 21  --   ALT <5  --   ALKPHOS 125  --   BILITOT 0.7  --   PROT 6.3*  --   ALBUMIN 2.5* 2.5*   No results for input(s): LIPASE, AMYLASE in the last 168 hours. Recent Labs  Lab 05/01/21 1450  AMMONIA 20   CBC: Recent Labs  Lab 05/01/21 1450 05/02/21 0240 05/04/21 0641  WBC 7.7 7.0 8.0  NEUTROABS 5.6  --   --   HGB 9.0* 8.8* 10.6*  HCT 28.6* 27.3* 32.8*  MCV 105.5* 105.0* 101.9*  PLT 282 284 297   Cardiac Enzymes: No results for input(s): CKTOTAL, CKMB, CKMBINDEX, TROPONINI in the last 168 hours. BNP: Invalid input(s): POCBNP CBG: Recent Labs  Lab 05/01/21 1433  GLUCAP 117*   D-Dimer No results for input(s): DDIMER in the last 72 hours. Hgb A1c No results for input(s): HGBA1C in the last 72 hours. Lipid Profile No results for input(s): CHOL, HDL, LDLCALC, TRIG, CHOLHDL, LDLDIRECT in the last 72 hours. Thyroid function studies No results for input(s): TSH, T4TOTAL, T3FREE, THYROIDAB in the last 72 hours.  Invalid input(s): FREET3 Anemia work up No results for  input(s): VITAMINB12, FOLATE, FERRITIN, TIBC, IRON, RETICCTPCT in the last 72 hours. Urinalysis    Component Value Date/Time   COLORURINE YELLOW 03/27/2021 1130   APPEARANCEUR HAZY (A) 03/27/2021 1130   LABSPEC 1.008 03/27/2021 1130   PHURINE 8.0 03/27/2021 1130   GLUCOSEU 50 (A) 03/27/2021 1130   HGBUR  MODERATE (A) 03/27/2021 1130   BILIRUBINUR NEGATIVE 03/27/2021 1130   BILIRUBINUR negative 02/19/2018 1643   KETONESUR NEGATIVE 03/27/2021 1130   PROTEINUR 100 (A) 03/27/2021 1130   UROBILINOGEN 0.2 02/19/2018 1643   UROBILINOGEN 1.0 08/22/2012 1402   NITRITE NEGATIVE 03/27/2021 1130   LEUKOCYTESUR LARGE (A) 03/27/2021 1130   Sepsis Labs Invalid input(s): PROCALCITONIN,  WBC,  LACTICIDVEN Microbiology Recent Results (from the past 240 hour(s))  Resp Panel by RT-PCR (Flu A&B, Covid) Nasopharyngeal Swab     Status: None   Collection Time: 05/01/21  2:35 PM   Specimen: Nasopharyngeal Swab; Nasopharyngeal(NP) swabs in vial transport medium  Result Value Ref Range Status   SARS Coronavirus 2 by RT PCR NEGATIVE NEGATIVE Final    Comment: (NOTE) SARS-CoV-2 target nucleic acids are NOT DETECTED.  The SARS-CoV-2 RNA is generally detectable in upper respiratory specimens during the acute phase of infection. The lowest concentration of SARS-CoV-2 viral copies this assay can detect is 138 copies/mL. A negative result does not preclude SARS-Cov-2 infection and should not be used as the sole basis for treatment or other patient management decisions. A negative result may occur with  improper specimen collection/handling, submission of specimen other than nasopharyngeal swab, presence of viral mutation(s) within the areas targeted by this assay, and inadequate number of viral copies(<138 copies/mL). A negative result must be combined with clinical observations, patient history, and epidemiological information. The expected result is Negative.  Fact Sheet for Patients:  EntrepreneurPulse.com.au  Fact Sheet for Healthcare Providers:  IncredibleEmployment.be  This test is no t yet approved or cleared by the [place] FDA and  has been authorized for detection and/or diagnosis of SARS-CoV-2 by FDA under an Emergency Use Authorization (EUA). This EUA will  remain  in effect (meaning this test can be used) for the duration of the COVID-19 declaration under Section 564(b)(1) of the Act, 21 U.S.C.section 360bbb-3(b)(1), unless the authorization is terminated  or revoked sooner.       Influenza A by PCR NEGATIVE NEGATIVE Final   Influenza B by PCR NEGATIVE NEGATIVE Final    Comment: (NOTE) The Xpert Xpress SARS-CoV-2/FLU/RSV plus assay is intended as an aid in the diagnosis of influenza from Nasopharyngeal swab specimens and should not be used as a sole basis for treatment. Nasal washings and aspirates are unacceptable for Xpert Xpress SARS-CoV-2/FLU/RSV testing.  Fact Sheet for Patients: EntrepreneurPulse.com.au  Fact Sheet for Healthcare Providers: IncredibleEmployment.be  This test is not yet approved or cleared by the [place] FDA and has been authorized for detection and/or diagnosis of SARS-CoV-2 by FDA under an Emergency Use Authorization (EUA). This EUA will remain in effect (meaning this test can be used) for the duration of the COVID-19 declaration under Section 564(b)(1) of the Act, 21 U.S.C. section 360bbb-3(b)(1), unless the authorization is terminated or revoked.  Performed at [facility], [place] [address] [place], [place] [address]      Time coordinating discharge: Over 30 minutes  SIGNED:   Darliss Cheney, MD  [facility] 05/06/2021, 10:35 AM  If 7PM-7AM, please contact night-coverage [email or web address]

## 2021-05-10 ENCOUNTER — Other Ambulatory Visit: Payer: Self-pay

## 2021-05-10 ENCOUNTER — Non-Acute Institutional Stay: Payer: Medicare Other | Admitting: Internal Medicine

## 2021-05-10 VITALS — BP 132/62 | HR 90 | Temp 98.1°F | Resp 18 | Ht 66.0 in | Wt 143.0 lb

## 2021-05-10 DIAGNOSIS — T8579XA Infection and inflammatory reaction due to other internal prosthetic devices, implants and grafts, initial encounter: Secondary | ICD-10-CM

## 2021-05-10 DIAGNOSIS — R410 Disorientation, unspecified: Secondary | ICD-10-CM

## 2021-05-10 DIAGNOSIS — N186 End stage renal disease: Secondary | ICD-10-CM

## 2021-05-10 DIAGNOSIS — K811 Chronic cholecystitis: Secondary | ICD-10-CM

## 2021-05-10 DIAGNOSIS — Z515 Encounter for palliative care: Secondary | ICD-10-CM

## 2021-05-10 NOTE — Progress Notes (Signed)
Elijah White: [phone]  Fax: [phone]   Date of encounter: 05/10/21 12:41 PM PATIENT NAME: Elijah White [address] [place] [address]   [phone] (home)  DOB: 1940-09-28 MRN: [id number] PRIMARY CARE PROVIDER:    Dr. Madison White  REFERRING PROVIDER:   Dr. Madison White  RESPONSIBLE PARTY:    Contact Information     Name Relation Home Work Mobile   [place] Daughter [phone]  [phone]   [facility] Niece   [phone]        I met face to face with patient and family in [place] facility. Palliative Care was asked to follow this patient by consultation request of  Elijah White who took over for Elijah White to address advance care planning and complex medical decision making. This is follow-up visit.                                     ASSESSMENT AND PLAN / RECOMMENDATIONS:   Advance Care Planning/Goals of Care: Goals include to maximize quality of life and symptom management. Patient/health care surrogate gave his/her permission to discuss.Our advance care planning conversation included a discussion about:    The value and importance of advance care planning  Experiences with loved ones who have been seriously ill or have died  Exploration of personal, cultural or spiritual beliefs that might influence medical decisions  Exploration of goals of care in the event of a sudden injury or illness  Identification  of a healthcare agent  Review and updating or creation of an  advance directive document . Decision not to resuscitate or to de-escalate disease focused treatments due to poor prognosis. CODE STATUS:  DNR, MOST with DNR, limited additional interventions, abx if indicated, IVF if indicated, no feeding tube as completed in Jan 2021 with dtr Elijah White  Symptom Management/Plan: ESRD:  stopped HD, his daughter and son are in agreement with hospice and  prefer inpt unit for his comfort and facility has had challenges calming him with his hyperactive delirium  Cholelithiasis with recurrent infection:  has drain in place, high risk for recurrent infection at drain site and ascending cholangitis; no further hospitalizations desired   Follow up Palliative Care Visit: [facility] NP has made arrangements and Elijah White will be attending of record for hospice care--referral placed through palliative as well as separately by social work today.    This visit was coded based on medical decision making (MDM).  PPS: 20%  HOSPICE ELIGIBILITY/DIAGNOSIS: Yes/ESRD if preferences remain consistent to stop HD  Chief Complaint: Follow-up palliative visit  HISTORY OF PRESENT ILLNESS:  Elijah White is a 81 y.o. year old male  with ESRD on HD, hypertensive CKD, DMII, gerd, hyperlipidemia, glaucoma with blindness, severe AS s/p TAVR, chronic diastolic chf, PAD, iron deficiency anemia, protein calorie malnutrition, secondary hyperparathyroidism, incontinence, heel pressure injury, d cholelithiasis, among others.   Pt has just stopped HD yesterday after skipping sessions off and on for several months now.  He is not eating and drinking but sips the past 24-48 hrs.  He reports tightness that he cannot specify.  This morning, he was agitated and combative and would not really allow any staff near him.  He received IM ativan and is now calm and resting.  He declines his lunch.  He denies pain.  His gallbladder drain has bilious fluid in  it.  No surrounding erythema or warmth.  He is wearing a boot on his heel to prevent pressure injury from deteriorating.    NP spoke with his sister in detail who is now ready to accept that he is nearing end of life and does not want HD.  She'd spoken with her brother and they were in agreement.  At this point, he's not eating or drinking, agitated, combative and appears he will require inpatient unit hospice care.  He was still making  small amounts of urine.  He denies sob.    He is blind.  History obtained from review of EMR, discussion with primary team, and interview with family, facility staff/caregiver and/or Elijah White.  I reviewed available labs, medications, imaging, studies and related documents from the EMR.  Records reviewed and summarized above.   ROS  General: NAD EYES: blind ENMT: denies dysphagia Cardiovascular: denies chest pain, denies DOE Pulmonary: denies cough, denies increased SOB Abdomen: endorses poor appetite--no intake in a day, denies constipation, endorses incontinence of bowel GU: denies dysuria, endorses incontinence of urine MSK:  has increased weakness,  no falls reported Skin: denies rashes or wounds--heel using prevalon boot Neurological: reports tightness but cannot specify, denies insomnia Psych: staff endorse severe agitation earlier, cussing, kicking them out of his room, now calm and resting after ativan Heme/lymph/immuno: denies bruises, abnormal bleeding  Physical Exam: Current and past weights: 143 lbs down from 150 lbs in Jan '23 Constitutional: grabbing at his depend--reporting "tightness" on his feet and abdomen General: frail appearing, thin EYES: anicteric sclera, lids intact, no discharge, blind ENMT: intact hearing, oral mucous membranes moist CV: S1S2, RRR, no LE edema Pulmonary: LCTA, no increased work of breathing, no cough, room air Abdomen: intake 0% today and 25% prior, normo-active BS + 4 quadrants, soft and non tender, no ascites GU: deferred MSK: sarcopenia, moves all extremities Skin: warm and dry, wearing prevalon boot on foot but wanting it removed Neuro:  has generalized weakness,  has cognitive impairment Psych: anxious affect, A and O x to person, place, not time Hem/lymph/immuno: no widespread bruising  CURRENT PROBLEM LIST:  Patient Active Problem List   Diagnosis Date Noted   Pleural effusion, right 05/02/2021   Prolonged QT interval  05/02/2021   Encephalopathy acute 05/01/2021   Pressure ulcer of sacral region, stage 3 ([place]) 04/23/2021   Intractable nausea and vomiting 04/22/2021   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 03/28/2021 04/22/2021   Bilateral hydroureteronephrosis 04/22/2021   At risk for delirium 04/22/2021   Delirium 04/12/2021   Chronic hypotension 04/07/2021   Parkinson's disease ([place]) 04/07/2021   Acute urinary retention with foley  04/07/2021   Aspiration pneumonia ([place]) 04/07/2021   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 04/07/2021   Paroxysmal atrial fibrillation ([place]) 04/07/2021   Pressure injury of skin 03/28/2021   PAD (peripheral artery disease) ([place])    Complicated UTI (urinary tract infection)    Acute cholecystitis 03/15/2021   Hypokalemia 09/28/2020   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, 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[place]) 08/12/2020   Blindness/glaucoma  01/19/2020   Acute on chronic diastolic heart failure ([place]) 01/19/2020   RBBB    Hypercalcemia 12/16/2019   Near syncope 12/13/2019   Moderate protein-calorie malnutrition ([place]) 12/04/2019   Severe aortic stenosis s/p TAVR     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[place]) 11/26/2019   Macrocytic anemia 11/26/2019   2.5 cm left adrenal nodule 11/26/2019   Colonic mass 11/26/2019   Allergy, unspecified, initial encounter 11/16/2019   Anaphylactic  shock, unspecified, initial encounter 11/16/2019   Other long term (current) drug therapy 09/05/2019   Iron deficiency anemia, unspecified 07/13/2019   Palliative care encounter 04/17/2019   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   Dependence on renal dialysis ([place]) 04/16/2019   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 kidney disease, or unspecified chronic kidney disease 04/16/2019   Localized edema 04/16/2019   Nicotine dependence, unspecified, uncomplicated 81/19/1478   Other disorders of phosphorus metabolism 04/16/2019   Pain, unspecified  04/16/2019   Pruritus, unspecified 04/16/2019   Secondary hyperparathyroidism of renal origin ([place]) 04/16/2019   Shortness of breath 04/16/2019   Unspecified glaucoma 04/16/2019   Unspecified urinary incontinence 04/16/2019   ARF (acute renal failure) ([place]) 03/25/2018   Vision loss of right eye 11/11/2017   Bilateral pseudophakia 07/11/2017   GERD (gastroesophageal reflux disease)/esophageal candidiasis  09/10/2016   HLD (hyperlipidemia) 09/10/2016   Essential hypertension 08/13/2016   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) 10/22/2006   COLONIC POLYPS, HX OF 10/22/2006   PAST MEDICAL HISTORY:  Active Ambulatory Problems    Diagnosis Date Noted   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) 10/22/2006   COLONIC POLYPS, HX OF 10/22/2006   Essential hypertension 08/13/2016   Bilateral pseudophakia 07/11/2017   GERD (gastroesophageal reflux disease)/esophageal candidiasis  09/10/2016   HLD (hyperlipidemia) 09/10/2016   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   Vision loss of right eye 11/11/2017   ARF (acute renal failure) ([place]) 03/25/2018   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[place]) 11/26/2019   Macrocytic anemia 11/26/2019   2.5 cm left adrenal nodule 11/26/2019   Colonic mass 11/26/2019   Severe aortic stenosis s/p TAVR     Near syncope 12/13/2019   RBBB    Blindness/glaucoma  01/19/2020   Acute on chronic diastolic heart failure ([place]) 01/19/2020   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, 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[place]) 08/12/2020   Allergy, unspecified, initial encounter 11/16/2019   Anaphylactic shock, unspecified, initial encounter 11/16/2019   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   Dependence on renal dialysis ([place]) 04/16/2019   Palliative care encounter 04/17/2019   Hypercalcemia 12/16/2019   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 kidney disease, or unspecified chronic  kidney disease 04/16/2019   Hypokalemia 09/28/2020   Iron deficiency anemia, unspecified 07/13/2019   Localized edema 04/16/2019   Moderate protein-calorie malnutrition ([place]) 12/04/2019   Nicotine dependence, unspecified, uncomplicated 29/56/2130   Other disorders of phosphorus metabolism 04/16/2019   Other long term (current) drug therapy 09/05/2019   Pain, unspecified 04/16/2019   Pruritus, unspecified 04/16/2019   Secondary hyperparathyroidism of renal origin ([place]) 04/16/2019   Shortness of breath 04/16/2019   Unspecified glaucoma 04/16/2019   Unspecified urinary incontinence 04/16/2019   Acute cholecystitis 86/57/8469   Complicated UTI (urinary tract infection)    PAD (peripheral artery disease) (HCC)    Pressure injury of skin 03/28/2021   Chronic hypotension 04/07/2021   Parkinson's disease ([place]) 04/07/2021   Acute urinary retention with foley  04/07/2021   Aspiration pneumonia ([place]) 04/07/2021   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 04/07/2021   Paroxysmal atrial fibrillation ([place]) 04/07/2021   Delirium 04/12/2021   Intractable nausea and vomiting 04/22/2021   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 03/28/2021 04/22/2021   Bilateral hydroureteronephrosis 04/22/2021   At risk for delirium 04/22/2021   Pressure ulcer of sacral region, stage 3 ([place]) 04/23/2021  Encephalopathy acute 05/01/2021   Pleural effusion, right 05/02/2021   Prolonged QT interval 05/02/2021   Resolved Ambulatory Problems    Diagnosis Date Noted   BPH (benign prostatic hyperplasia) 10/22/2006   Dizziness 08/13/2016   Renal failure (ARF), acute on chronic (HCC) 08/13/2016   Acute decompensated heart failure ([place]) 09/01/2017   Cataract, nuclear 10/29/2012   Choroidal detachment of left eye 07/11/2017   DM type 2 controlled with CKD 4 02/20/2018   Chronic diastolic CHF (congestive heart failure) ([place]) 03/25/2018   Chest pain 11/26/2019   S/P TAVR (transcatheter  aortic valve replacement) 01/19/2020   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[place]) 08/23/2020   Malignant neoplasm of prostate ([place]) 36/02/2448   Alcoholic hepatitis with ascites 03/15/2021   DNR (do not resuscitate)    Sepsis ([place]) 04/07/2021   History of pneumonia 04/07/2021   Past Medical History:  Diagnosis Date   Anemia    Aortic stenosis    Arthritis    Cancer ([place])    Colon polyps ~ 1993 and 2003   Diabetes mellitus without complication (HCC)    Elevated cholesterol with high triglycerides    ESRD (end stage renal disease) ([place])    Glaucoma    Hypertension    Legally blind in left eye, as defined in [place]    SOCIAL HX:  Social History   Tobacco Use   Smoking status: Former    Packs/day: 0.50    Types: Cigarettes    Quit date: 03/23/2018    Years since quitting: 3.1   Smokeless tobacco: Never  Substance Use Topics   Alcohol use: No     ALLERGIES: No Known Allergies   PERTINENT MEDICATIONS:  Outpatient Encounter Medications as of 05/10/2021  Medication Sig   acetaminophen (TYLENOL) 500 MG tablet Take 2 tablets (1,000 mg total) by mouth every 8 (eight) hours as needed for moderate pain or headache.   atorvastatin (LIPITOR) 10 MG tablet Take 1 tablet (10 mg total) by mouth in the morning. (Patient taking differently: Take 10 mg by mouth daily.)   B Complex-C-Folic Acid (DIALYVITE 753) 0.8 MG TABS Take 1 tablet by mouth daily.   Baclofen 5 MG TABS Take 5 mg by mouth 3 (three) times daily as needed (hiccups).   blood glucose meter kit and supplies KIT Dispense based on patient and insurance preference. Use up to four times daily as directed. (FOR ICD-9 250.00, 250.01). For QAC - HS accuchecks.   brimonidine (ALPHAGAN) 0.15 % ophthalmic solution Place 1 drop into the right eye 3 (three) times daily.   carbidopa-levodopa (SINEMET) 25-100 MG tablet Take 1 tablet by mouth 3 (three) times daily. (Patient taking differently: Take 1 tablet by mouth See admin instructions. Three  times daily at 1000, 1600, and 2200)   cinacalcet (SENSIPAR) 30 MG tablet Take 2 tablets (60 mg total) by mouth every Monday, Wednesday, and Friday with hemodialysis.   clopidogrel (PLAVIX) 75 MG tablet Take 1 tablet (75 mg total) by mouth daily with breakfast.   dorzolamide-timolol (COSOPT) 22.3-6.8 MG/ML ophthalmic solution Place 1 drop into the right eye 2 (two) times daily.   ferric citrate (AURYXIA) 1 GM 210 MG(Fe) tablet Take 420 mg by mouth 3 (three) times daily with meals.   gabapentin (NEURONTIN) 100 MG capsule Take 1 capsule (100 mg total) by mouth daily.   hypromellose (GENTEAL SEVERE) 0.3 % GEL ophthalmic ointment Place 1 application into the left eye every 12 (twelve) hours as needed for dry eyes.  melatonin 5 MG TABS Take 5 mg by mouth at bedtime.   metoCLOPramide (REGLAN) 5 MG tablet Take 5 mg by mouth 2 (two) times daily as needed for nausea or vomiting.   midodrine (PROAMATINE) 10 MG tablet Take 1 tablet (10 mg total) by mouth 3 (three) times daily with meals as needed (SBP <100).   Netarsudil-Latanoprost (ROCKLATAN) 0.02-0.005 % SOLN Place 1 drop into the right eye at bedtime.   ondansetron (ZOFRAN) 4 MG tablet Take 4 mg by mouth every 8 (eight) hours as needed for nausea or vomiting.   ONETOUCH VERIO test strip USE AS DIRECTED TO TEST FOUR TIMES A DAY   OVER THE COUNTER MEDICATION Take 1 Package by mouth with breakfast, with lunch, and with evening meal. Magic cup or mighty shake   pantoprazole (PROTONIX) 40 MG tablet Take 1 tablet (40 mg total) by mouth 2 (two) times daily.   pilocarpine (PILOCAR) 4 % ophthalmic solution Place 1 drop into the right eye 4 (four) times daily.    Pollen Extracts (PROSTAT PO) Take 30 mLs by mouth in the morning and at bedtime.   polyethylene glycol (MIRALAX / GLYCOLAX) 17 g packet Take 17 g by mouth daily. (Patient taking differently: Take 17 g by mouth daily. Mix in 4 oz water and drink)   senna-docusate (SENOKOT-S) 8.6-50 MG tablet Take 1 tablet  by mouth at bedtime.   Vitamin D, Ergocalciferol, (DRISDOL) 1.25 MG (50000 UNIT) CAPS capsule Take 50,000 Units by mouth See admin instructions. Every Monday x 8 weeks   No facility-administered encounter medications on file as of 05/10/2021.   Thank you for the opportunity to participate in the care of Mr. Tiegs.  The palliative care team will continue to follow. Please call our office at [phone] if we can be of additional assistance.   Hollace Kinnier, DO  COVID-19 PATIENT SCREENING TOOL Asked and negative response unless otherwise noted:  Have you had symptoms of covid, tested positive or been in contact with someone with symptoms/positive test in the past 5-10 days? no

## 2021-05-12 ENCOUNTER — Encounter: Payer: Self-pay | Admitting: Internal Medicine

## 2021-05-16 ENCOUNTER — Ambulatory Visit: Payer: Medicare Other | Admitting: Neurology

## 2021-06-27 ENCOUNTER — Ambulatory Visit: Payer: Medicare Other | Admitting: Podiatry

## 2021-07-20 ENCOUNTER — Ambulatory Visit: Payer: Medicare Other | Admitting: Internal Medicine

## 2021-07-20 ENCOUNTER — Ambulatory Visit: Payer: Medicare Other

## 2021-07-25 ENCOUNTER — Ambulatory Visit: Payer: Medicare Other | Admitting: Cardiovascular Disease

## 2021-09-24 IMAGING — DX DG CHEST 2V
2 series · 2 of 2 positions shown · non-contrast
Comparison: 01/19/2020

CLINICAL DATA: Weakness, shortness of breath, and chest pain
starting today. Last dialysis yesterday.

EXAM:
CHEST - 2 VIEW

[chest lat]
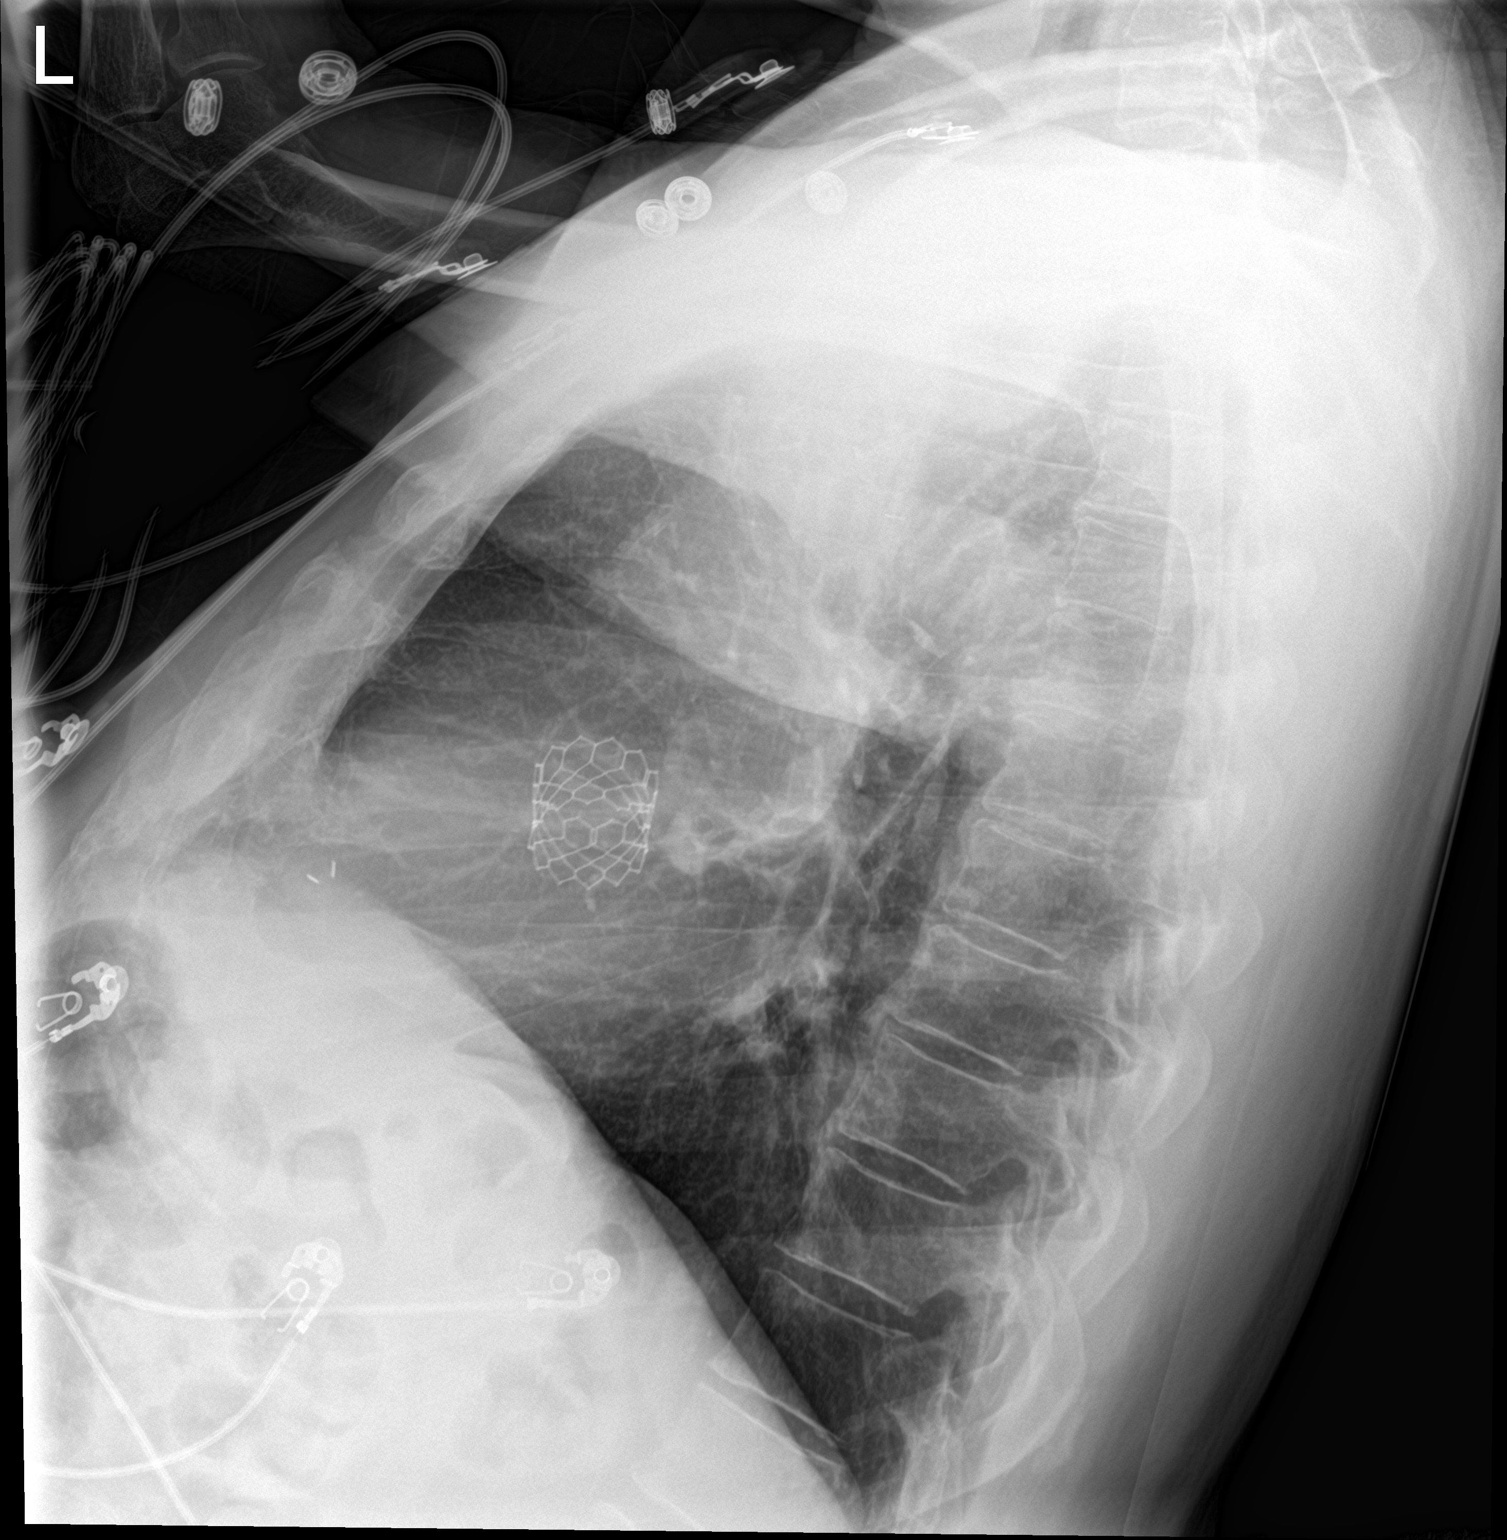

[chest ap]
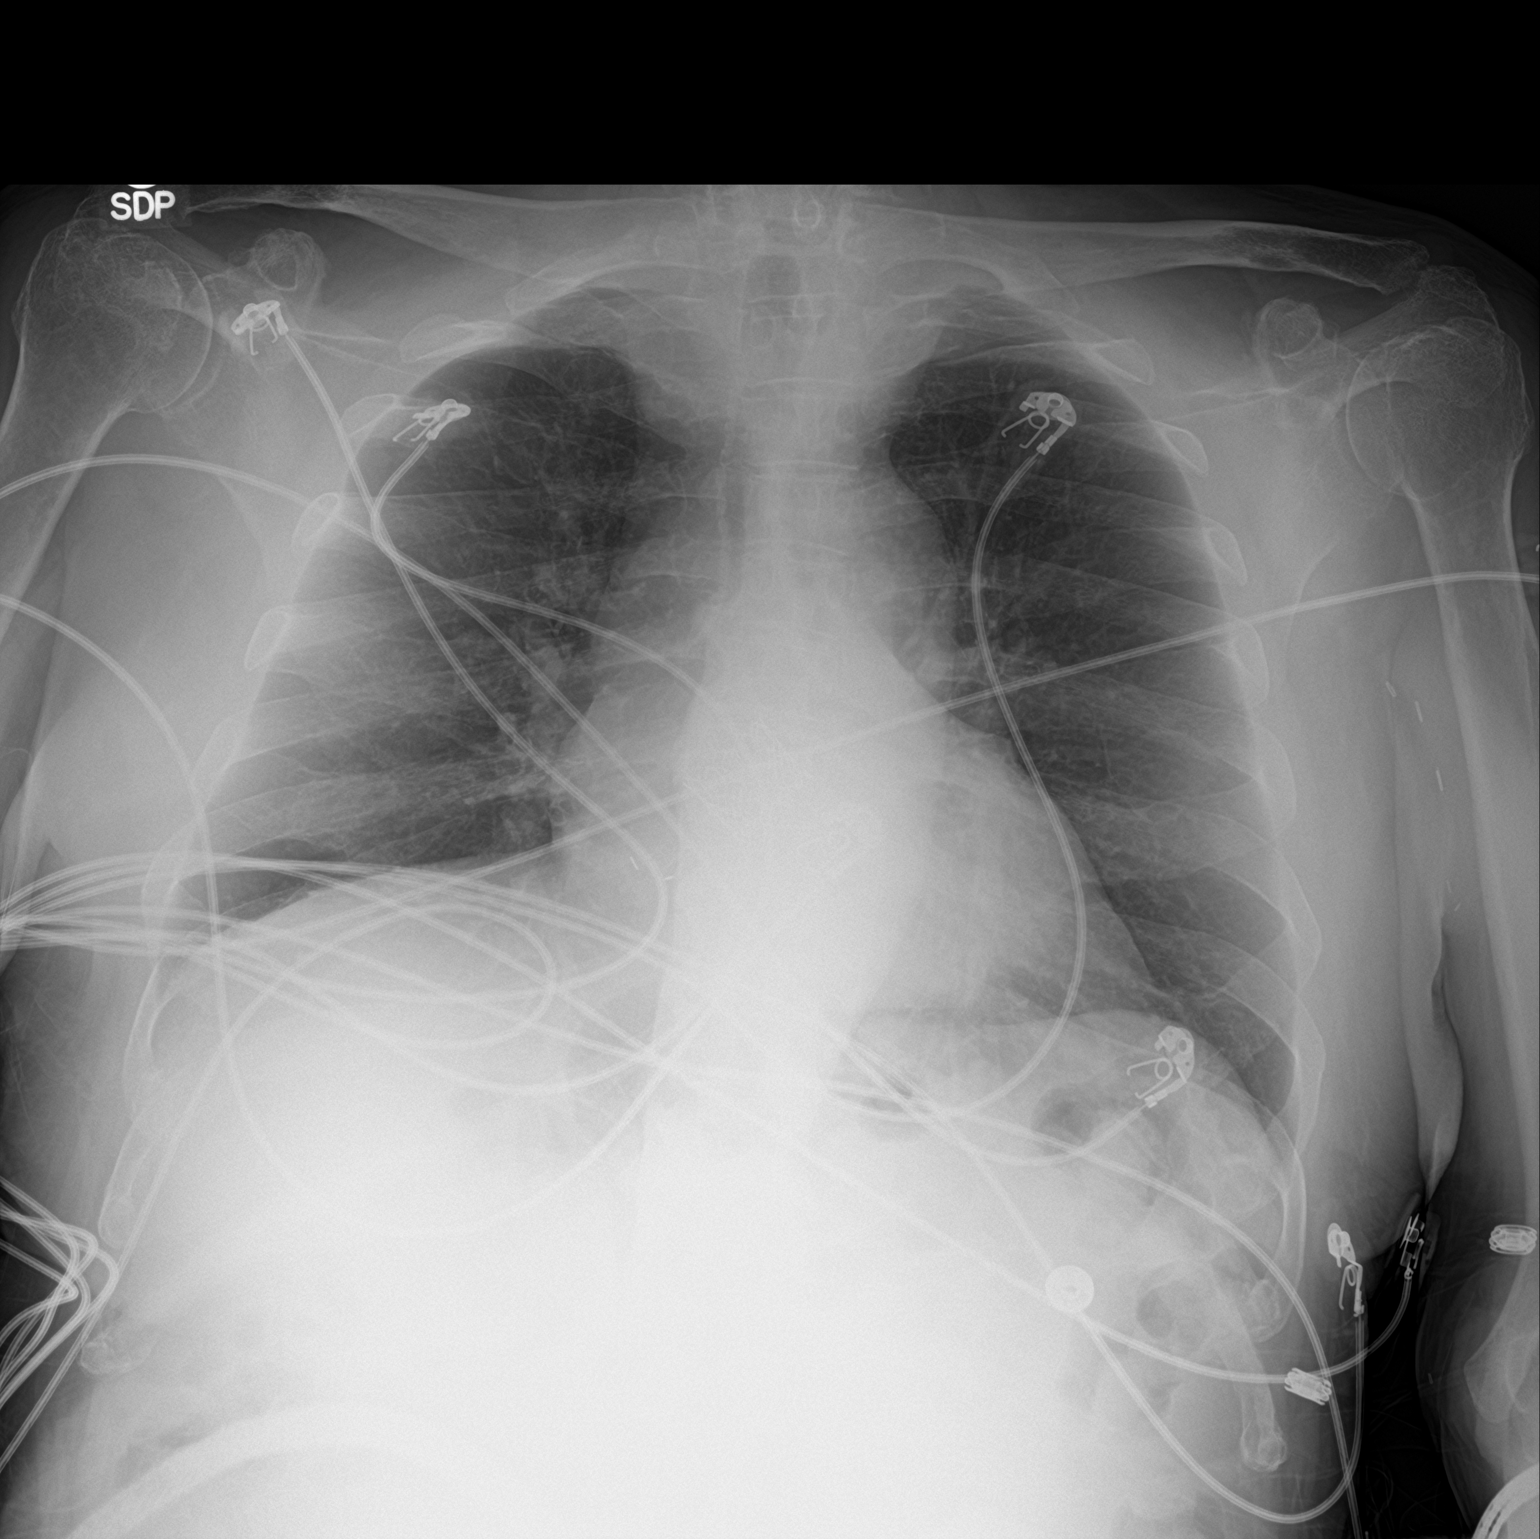

[2 of 2 positions shown; findings below may reference images not displayed]

FINDINGS: Shallow inspiration. Heart size and pulmonary vascularity are
normal. Cardiac valve prosthesis. Lungs are clear. No pleural
effusions. No pneumothorax. Surgical clips in the left axilla.
Degenerative changes in the spine and shoulders.
IMPRESSION: No active cardiopulmonary disease.

## 2021-09-24 IMAGING — CT CT ANGIO AOBIFEM WO/W CM
2 of 7 series · 12 of 46 positions shown, 15 images · IV contrast (omnipaque)
Comparison: CT a abdomen 12/01/2019

CLINICAL DATA: Leg pain.  End-stage renal disease.

EXAM:
CT ANGIOGRAPHY OF ABDOMINAL AORTA WITH ILIOFEMORAL RUNOFF
TECHNIQUE: Multidetector CT imaging of the abdomen, pelvis and lower
extremities was performed using the standard protocol during bolus
administration of intravenous contrast. Multiplanar CT image
reconstructions and MIPs were obtained to evaluate the vascular
anatomy.
CONTRAST:  125mL OMNIPAQUE IOHEXOL 350 MG/ML SOLN

[Series 6: angiorunoff 3.0 i30f 3 · axial · 0.87mm/px · z∈[+4,+1195]mm · 11 of 441 slices shown, 13 images]
[im 29/441  soft-tissue]
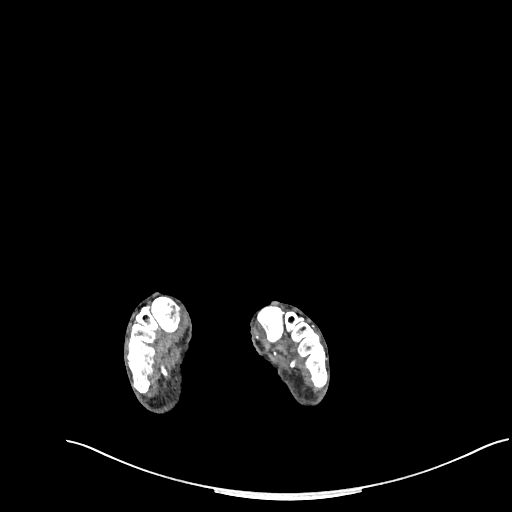
[im 29/441  bone]
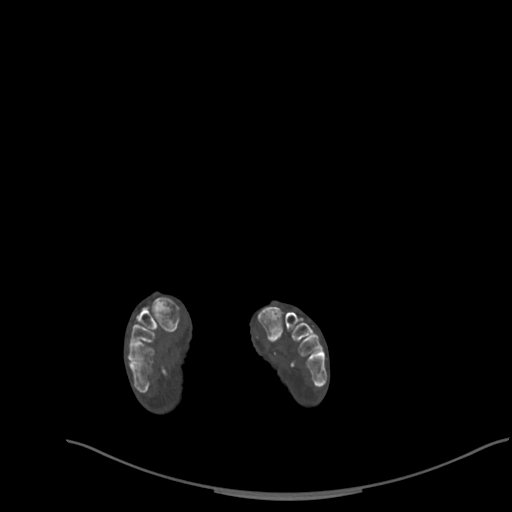
[im 86/441  soft-tissue]
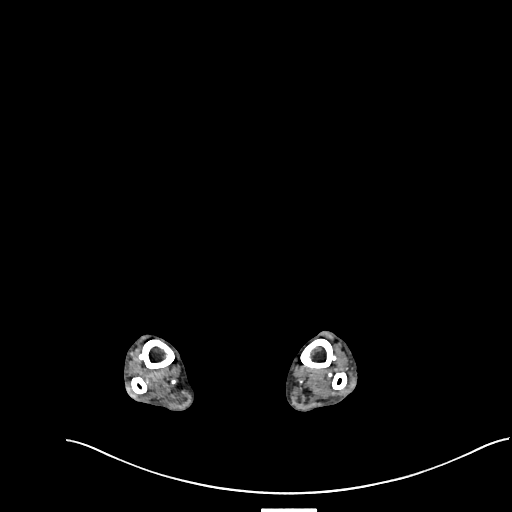
[im 142/441  soft-tissue]
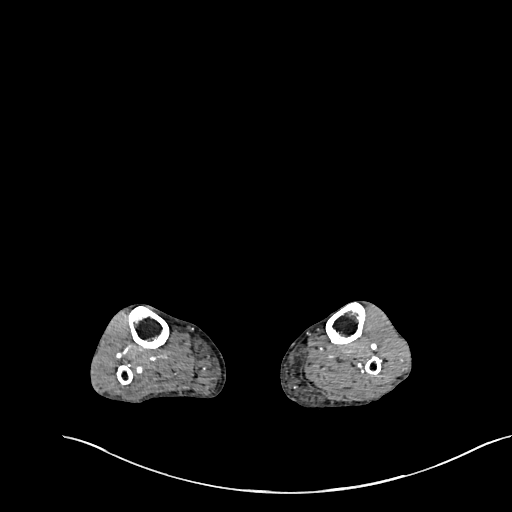
[im 199/441  soft-tissue]
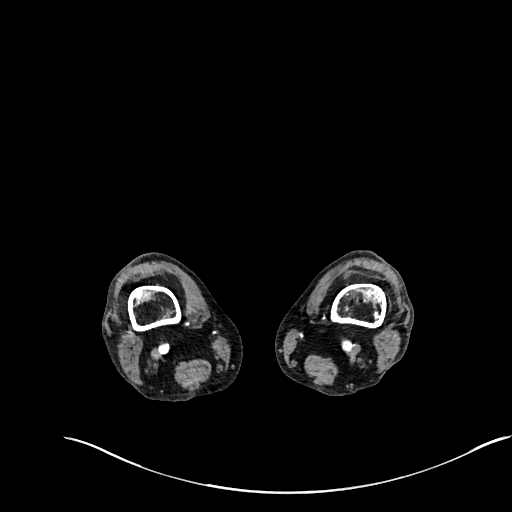
[im 242/441  soft-tissue]
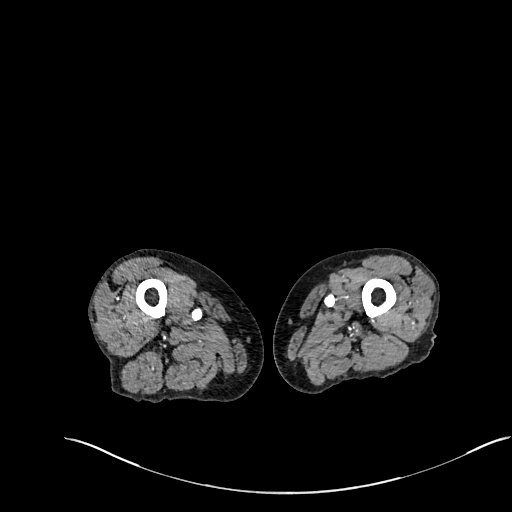
[im 299/441  soft-tissue]
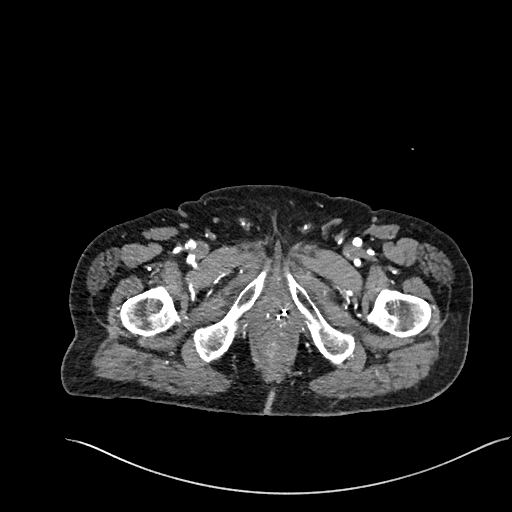
[im 355/441  soft-tissue]
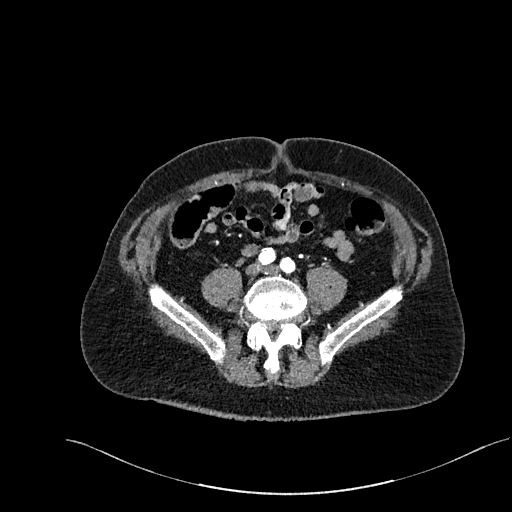
[im 384/441  lung]
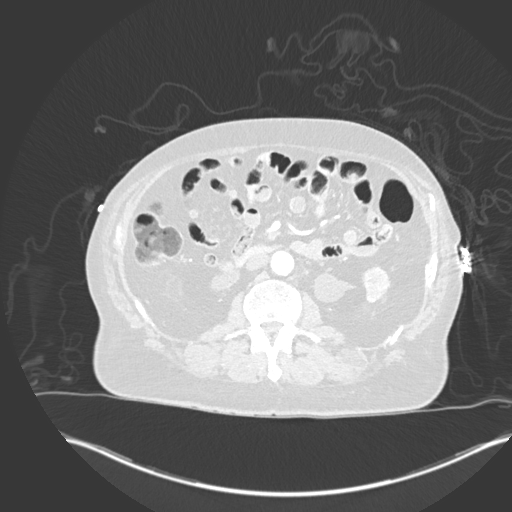
[im 398/441  lung]
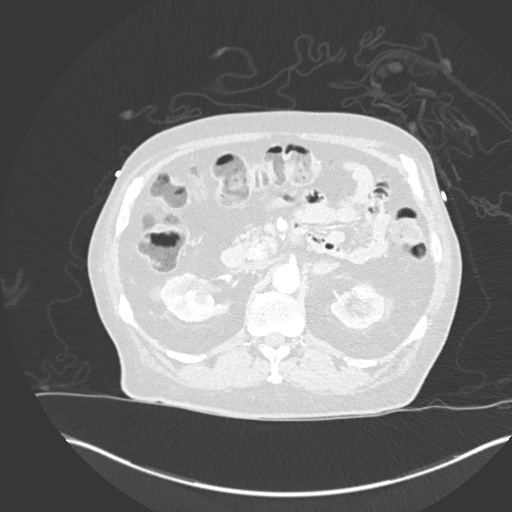
[im 412/441  soft-tissue]
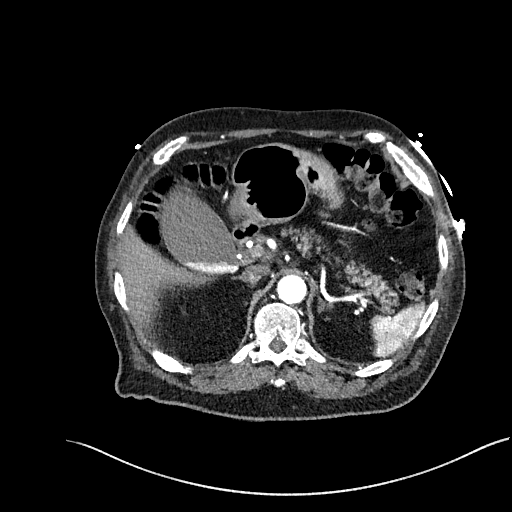
[im 412/441  lung]
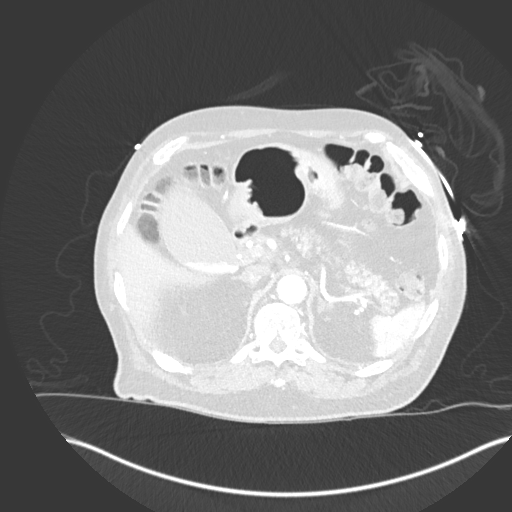
[im 426/441  lung]
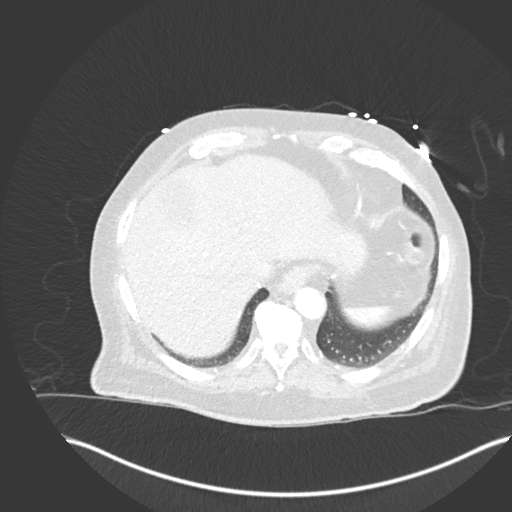

[Series 8: coronal upper · coronal · 0.74mm/px · 1 of 152 slices shown, 2 images]
[im 76/152  soft-tissue]
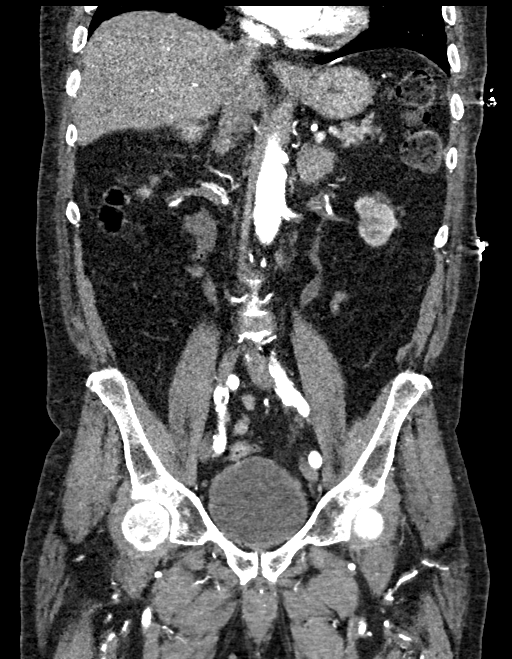
[im 76/152  bone]
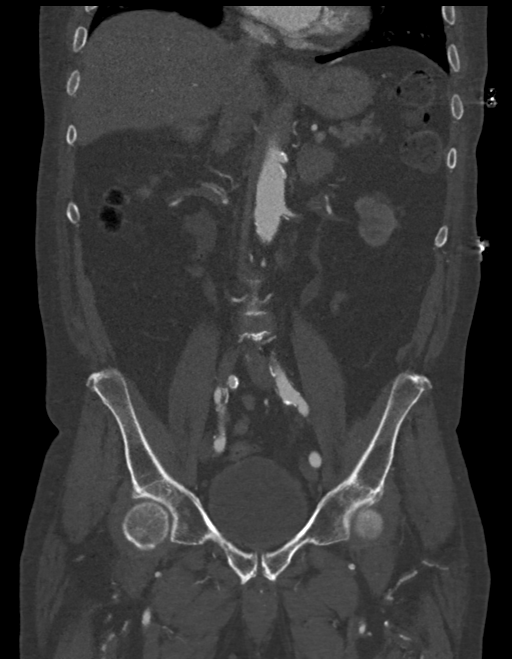

[12 of 46 positions shown; findings below may reference images not displayed]

FINDINGS: VASCULAR

Aorta: Moderate to Brantley Yah severe atherosclerotic plaque.
Normal caliber aorta without aneurysm, dissection, vasculitis or
significant stenosis.

Celiac: Atherosclerotic plaque. Patent without evidence of aneurysm,
dissection, vasculitis or significant stenosis.

SMA: Atherosclerotic plaque. Patent without evidence of aneurysm,
dissection, vasculitis or significant stenosis.

Renals: Severe atherosclerotic plaque of the left renal artery. Both
renal arteries are patent without evidence of aneurysm, dissection,
vasculitis, fibromuscular dysplasia or significant stenosis.

IMA: Patent without evidence of aneurysm, dissection, vasculitis or
significant stenosis.

RIGHT Lower Extremity

Inflow: At least moderate calcified and noncalcified atherosclerotic
plaque. Common, internal and external iliac arteries are patent
without evidence of aneurysm, dissection, vasculitis or significant
stenosis.

Outflow: At least moderate calcified and noncalcified
atherosclerotic plaque. Common, superficial and profunda femoral
arteries and the popliteal artery are patent without evidence of
aneurysm, dissection, vasculitis or significant stenosis.

Runoff: 3 vessel runoff to the ankle with all 3 arteries
intermittently occluded due to calcified and noncalcified
atherosclerotic plaque. Vascular flow is noted to the foot.

LEFT Lower Extremity

Inflow: At least moderate calcified and noncalcified atherosclerotic
plaque. Common, internal and external iliac arteries are patent
without evidence of aneurysm, dissection, vasculitis or significant
stenosis.

Outflow: Severe calcified and noncalcified atherosclerotic plaque
with occlusion of the popliteal artery with reconstitution distally.
The occlusion extends from its origin to approximately 8 cm
caudally. Common, superficial and profunda femoral arteries are
patent without evidence of aneurysm, dissection, vasculitis or
significant stenosis.

Runoff: 3 vessel runoff to the ankle with all 3 arteries
intermittently occluded due to calcified and noncalcified
atherosclerotic plaque. Vascular flow is noted to the foot.

Veins: No obvious venous abnormality within the limitations of this
arterial phase study.

Review of the MIP images confirms the above findings.

NON-VASCULAR

Lower chest: Partially visualized aortic valve replacement.
Bilateral lower lobe subsegmental atelectasis. No acute abnormality.

Hepatobiliary: No focal liver abnormality. Layering hyperdensity
within the gallbladder lumen consistent with gallstones. No
gallbladder wall thickening, or pericholecystic fluid. No biliary
dilatation.

Pancreas: Diffusely atrophic. No focal lesion. Otherwise normal
pancreatic contour. No surrounding inflammatory changes. No main
pancreatic ductal dilatation.

Spleen: Normal in size without focal abnormality.

Adrenals/Urinary Tract:

Stable 2.1 cm right adrenal gland nodule with a density of 59
Hounsfield units and stable 2.9 cm left adrenal gland nodule with a
density of 85 Hounsfield units.

Nonspecific bilateral renal cortical scarring. Bilateral kidneys
enhance symmetrically. Bilateral renal cortical scarring and
atrophy. Bilateral extrarenal pelvis with no hydronephrosis. No
hydroureter.

The urinary bladder is unremarkable.

Stomach/Bowel: Stomach is within normal limits. No evidence of bowel
wall thickening or dilatation. Appendix appears normal.

Lymphatic: No lymphadenopathy.

Reproductive: Prominent prostate. Radiation the CT noted within the
prostate.

Other: No intraperitoneal free fluid. No intraperitoneal free gas.
No organized fluid collection.

Musculoskeletal:

Question tiny fat containing left inguinal hernia. Tiny fat
containing umbilical hernia.

No suspicious lytic or blastic osseous lesions. No acute displaced
fracture. No hip dislocation. Multilevel degenerative changes of the
spine.
IMPRESSION: VASCULAR

1. Left popliteal atherosclerotic occlusion extending approximately
8 cm in the craniocaudal dimension with reconstitution distally.
2. Bilateral three-vessel runoff with intermittent occlusion of the
leg arterial vasculature due to calcified and noncalcified plaque.
3.  Aortic Atherosclerosis ([id number]).

NON-VASCULAR

1. Cholelithiasis with no acute cholecystitis.
2. Bilateral atrophic kidneys.
3. Stable in size and appearance of bilateral adrenal gland nodules
likely representing an adrenal adenoma.
4. Prominent prostate with radiation seeds noted.

These results were called by telephone at the time of interpretation
on 06/25/2020 at [DATE] to provider Dr. Nancy, who verbally
acknowledged these results.
